# Patient Record
Sex: Female | Born: 1960
Health system: Southern US, Community
[De-identification: ages and names within clinical notes are randomized; demographics above are authoritative.]

## PROBLEM LIST (undated history)

## (undated) DIAGNOSIS — N393 Stress incontinence (female) (male): Secondary | ICD-10-CM

## (undated) DIAGNOSIS — M869 Osteomyelitis, unspecified: Secondary | ICD-10-CM

## (undated) DIAGNOSIS — F209 Schizophrenia, unspecified: Secondary | ICD-10-CM

## (undated) DIAGNOSIS — K255 Chronic or unspecified gastric ulcer with perforation: Secondary | ICD-10-CM

## (undated) DIAGNOSIS — I10 Essential (primary) hypertension: Secondary | ICD-10-CM

## (undated) DIAGNOSIS — Z89511 Acquired absence of right leg below knee: Secondary | ICD-10-CM

## (undated) DIAGNOSIS — M009 Pyogenic arthritis, unspecified: Secondary | ICD-10-CM

## (undated) HISTORY — DX: Stress incontinence (female) (male): N39.3

## (undated) HISTORY — DX: Pyogenic arthritis, unspecified: M00.9

## (undated) HISTORY — DX: Osteomyelitis, unspecified: M86.9

## (undated) HISTORY — PX: TUBAL LIGATION: SHX77

---

## 1997-06-24 ENCOUNTER — Emergency Department (HOSPITAL_COMMUNITY): Admission: EM | Admit: 1997-06-24 | Discharge: 1997-06-24 | Payer: Self-pay | Admitting: Emergency Medicine

## 1998-12-23 ENCOUNTER — Emergency Department (HOSPITAL_COMMUNITY): Admission: EM | Admit: 1998-12-23 | Discharge: 1998-12-23 | Payer: Self-pay | Admitting: Emergency Medicine

## 1999-10-02 ENCOUNTER — Emergency Department (HOSPITAL_COMMUNITY): Admission: EM | Admit: 1999-10-02 | Discharge: 1999-10-02 | Payer: Self-pay | Admitting: Emergency Medicine

## 2000-02-18 ENCOUNTER — Emergency Department (HOSPITAL_COMMUNITY): Admission: EM | Admit: 2000-02-18 | Discharge: 2000-02-18 | Payer: Self-pay | Admitting: Emergency Medicine

## 2000-04-19 ENCOUNTER — Emergency Department (HOSPITAL_COMMUNITY): Admission: EM | Admit: 2000-04-19 | Discharge: 2000-04-19 | Payer: Self-pay | Admitting: Emergency Medicine

## 2000-06-23 ENCOUNTER — Emergency Department (HOSPITAL_COMMUNITY): Admission: EM | Admit: 2000-06-23 | Discharge: 2000-06-23 | Payer: Self-pay | Admitting: Emergency Medicine

## 2000-09-11 ENCOUNTER — Emergency Department (HOSPITAL_COMMUNITY): Admission: EM | Admit: 2000-09-11 | Discharge: 2000-09-11 | Payer: Self-pay | Admitting: Emergency Medicine

## 2002-11-11 ENCOUNTER — Emergency Department (HOSPITAL_COMMUNITY): Admission: EM | Admit: 2002-11-11 | Discharge: 2002-11-11 | Payer: Self-pay | Admitting: Emergency Medicine

## 2002-11-12 ENCOUNTER — Other Ambulatory Visit: Admission: RE | Admit: 2002-11-12 | Discharge: 2002-11-12 | Payer: Self-pay | Admitting: Nephrology

## 2003-01-26 ENCOUNTER — Emergency Department (HOSPITAL_COMMUNITY): Admission: AD | Admit: 2003-01-26 | Discharge: 2003-01-26 | Payer: Self-pay | Admitting: Family Medicine

## 2003-02-02 ENCOUNTER — Emergency Department (HOSPITAL_COMMUNITY): Admission: AD | Admit: 2003-02-02 | Discharge: 2003-02-02 | Payer: Self-pay | Admitting: Family Medicine

## 2003-05-12 ENCOUNTER — Emergency Department (HOSPITAL_COMMUNITY): Admission: EM | Admit: 2003-05-12 | Discharge: 2003-05-12 | Payer: Self-pay | Admitting: Family Medicine

## 2003-08-24 ENCOUNTER — Emergency Department (HOSPITAL_COMMUNITY): Admission: EM | Admit: 2003-08-24 | Discharge: 2003-08-24 | Payer: Self-pay | Admitting: Emergency Medicine

## 2003-09-01 ENCOUNTER — Emergency Department (HOSPITAL_COMMUNITY): Admission: EM | Admit: 2003-09-01 | Discharge: 2003-09-01 | Payer: Self-pay | Admitting: *Deleted

## 2004-11-21 ENCOUNTER — Emergency Department (HOSPITAL_COMMUNITY): Admission: EM | Admit: 2004-11-21 | Discharge: 2004-11-21 | Payer: Self-pay | Admitting: Emergency Medicine

## 2004-12-01 ENCOUNTER — Emergency Department (HOSPITAL_COMMUNITY): Admission: EM | Admit: 2004-12-01 | Discharge: 2004-12-01 | Payer: Self-pay | Admitting: Emergency Medicine

## 2005-04-18 ENCOUNTER — Emergency Department (HOSPITAL_COMMUNITY): Admission: EM | Admit: 2005-04-18 | Discharge: 2005-04-18 | Payer: Self-pay | Admitting: Emergency Medicine

## 2005-09-05 ENCOUNTER — Emergency Department (HOSPITAL_COMMUNITY): Admission: EM | Admit: 2005-09-05 | Discharge: 2005-09-05 | Payer: Self-pay | Admitting: Emergency Medicine

## 2005-09-29 ENCOUNTER — Emergency Department (HOSPITAL_COMMUNITY): Admission: EM | Admit: 2005-09-29 | Discharge: 2005-09-29 | Payer: Self-pay | Admitting: Emergency Medicine

## 2005-10-03 ENCOUNTER — Ambulatory Visit: Payer: Self-pay | Admitting: *Deleted

## 2005-10-03 ENCOUNTER — Ambulatory Visit: Payer: Self-pay | Admitting: Family Medicine

## 2005-11-13 ENCOUNTER — Emergency Department (HOSPITAL_COMMUNITY): Admission: EM | Admit: 2005-11-13 | Discharge: 2005-11-13 | Payer: Self-pay | Admitting: Emergency Medicine

## 2006-12-28 ENCOUNTER — Emergency Department (HOSPITAL_COMMUNITY): Admission: EM | Admit: 2006-12-28 | Discharge: 2006-12-28 | Payer: Self-pay | Admitting: *Deleted

## 2007-05-11 ENCOUNTER — Emergency Department (HOSPITAL_COMMUNITY): Admission: EM | Admit: 2007-05-11 | Discharge: 2007-05-12 | Payer: Self-pay | Admitting: Emergency Medicine

## 2007-06-11 ENCOUNTER — Ambulatory Visit: Payer: Self-pay | Admitting: Family Medicine

## 2007-08-02 ENCOUNTER — Ambulatory Visit: Payer: Self-pay | Admitting: Family Medicine

## 2007-08-09 ENCOUNTER — Ambulatory Visit: Payer: Self-pay | Admitting: Family Medicine

## 2008-08-08 ENCOUNTER — Emergency Department (HOSPITAL_COMMUNITY): Admission: EM | Admit: 2008-08-08 | Discharge: 2008-08-08 | Payer: Self-pay | Admitting: Emergency Medicine

## 2008-08-28 ENCOUNTER — Emergency Department (HOSPITAL_COMMUNITY): Admission: EM | Admit: 2008-08-28 | Discharge: 2008-08-28 | Payer: Self-pay | Admitting: Family Medicine

## 2008-09-03 ENCOUNTER — Emergency Department (HOSPITAL_COMMUNITY): Admission: EM | Admit: 2008-09-03 | Discharge: 2008-09-03 | Payer: Self-pay | Admitting: Emergency Medicine

## 2008-09-09 ENCOUNTER — Observation Stay (HOSPITAL_COMMUNITY): Admission: EM | Admit: 2008-09-09 | Discharge: 2008-09-09 | Payer: Self-pay | Admitting: Emergency Medicine

## 2008-10-26 ENCOUNTER — Emergency Department (HOSPITAL_COMMUNITY): Admission: EM | Admit: 2008-10-26 | Discharge: 2008-10-26 | Payer: Self-pay | Admitting: Emergency Medicine

## 2009-01-29 ENCOUNTER — Ambulatory Visit: Payer: Self-pay | Admitting: Family Medicine

## 2009-01-29 LAB — CONVERTED CEMR LAB
ALT: 27 units/L (ref 0–35)
AST: 20 units/L (ref 0–37)
Albumin: 4.2 g/dL (ref 3.5–5.2)
Alkaline Phosphatase: 49 units/L (ref 39–117)
CO2: 23 meq/L (ref 19–32)
Calcium: 9.5 mg/dL (ref 8.4–10.5)
Chloride: 103 meq/L (ref 96–112)
Total Bilirubin: 0.4 mg/dL (ref 0.3–1.2)
Total Protein: 7.5 g/dL (ref 6.0–8.3)

## 2009-02-12 ENCOUNTER — Ambulatory Visit: Payer: Self-pay | Admitting: Family Medicine

## 2009-03-02 ENCOUNTER — Ambulatory Visit: Payer: Self-pay | Admitting: Family Medicine

## 2009-05-05 ENCOUNTER — Encounter (INDEPENDENT_AMBULATORY_CARE_PROVIDER_SITE_OTHER): Payer: Self-pay | Admitting: Family Medicine

## 2009-05-05 ENCOUNTER — Ambulatory Visit: Payer: Self-pay | Admitting: Internal Medicine

## 2009-05-05 LAB — CONVERTED CEMR LAB
Basophils Absolute: 0 10*3/uL (ref 0.0–0.1)
Basophils Relative: 0 % (ref 0–1)
Cholesterol: 236 mg/dL — ABNORMAL HIGH (ref 0–200)
Eosinophils Absolute: 0.1 10*3/uL (ref 0.0–0.7)
Eosinophils Relative: 2 % (ref 0–5)
HCT: 35.3 % — ABNORMAL LOW (ref 36.0–46.0)
HDL: 68 mg/dL (ref >39)
Hemoglobin: 11 g/dL — ABNORMAL LOW (ref 12.0–15.0)
LDL Cholesterol: 155 mg/dL — ABNORMAL HIGH (ref 0–99)
MCHC: 31.2 g/dL (ref 30.0–36.0)
MCV: 93.6 fL (ref 78.0–100.0)
Monocytes Absolute: 0.8 10*3/uL (ref 0.1–1.0)
Monocytes Relative: 12 % (ref 3–12)
RBC: 3.77 M/uL — ABNORMAL LOW (ref 3.87–5.11)
RDW: 14.5 % (ref 11.5–15.5)
Total CHOL/HDL Ratio: 3.5
VLDL: 13 mg/dL (ref 0–40)

## 2009-05-21 ENCOUNTER — Ambulatory Visit: Payer: Self-pay | Admitting: Family Medicine

## 2009-07-21 ENCOUNTER — Ambulatory Visit: Payer: Self-pay | Admitting: Family Medicine

## 2010-01-04 ENCOUNTER — Emergency Department (HOSPITAL_COMMUNITY): Admission: EM | Admit: 2010-01-04 | Discharge: 2010-01-04 | Payer: Self-pay | Admitting: Family Medicine

## 2010-04-03 ENCOUNTER — Encounter: Payer: Self-pay | Admitting: Family Medicine

## 2010-04-29 ENCOUNTER — Emergency Department (HOSPITAL_COMMUNITY): Payer: Self-pay

## 2010-04-29 ENCOUNTER — Emergency Department (HOSPITAL_COMMUNITY)
Admission: EM | Admit: 2010-04-29 | Discharge: 2010-04-29 | Disposition: A | Discharge status: Home / Self Care | Payer: Self-pay | Attending: Emergency Medicine | Admitting: Emergency Medicine

## 2010-04-29 DIAGNOSIS — R209 Unspecified disturbances of skin sensation: Secondary | ICD-10-CM | POA: Insufficient documentation

## 2010-04-29 DIAGNOSIS — J45909 Unspecified asthma, uncomplicated: Secondary | ICD-10-CM | POA: Insufficient documentation

## 2010-04-29 DIAGNOSIS — Z79899 Other long term (current) drug therapy: Secondary | ICD-10-CM | POA: Insufficient documentation

## 2010-04-29 DIAGNOSIS — I10 Essential (primary) hypertension: Secondary | ICD-10-CM | POA: Insufficient documentation

## 2010-04-29 DIAGNOSIS — R29898 Other symptoms and signs involving the musculoskeletal system: Secondary | ICD-10-CM | POA: Insufficient documentation

## 2010-04-29 DIAGNOSIS — R079 Chest pain, unspecified: Secondary | ICD-10-CM | POA: Insufficient documentation

## 2010-04-29 DIAGNOSIS — E119 Type 2 diabetes mellitus without complications: Secondary | ICD-10-CM | POA: Insufficient documentation

## 2010-04-29 DIAGNOSIS — F341 Dysthymic disorder: Secondary | ICD-10-CM | POA: Insufficient documentation

## 2010-04-29 LAB — POCT PREGNANCY, URINE: Preg Test, Ur: NEGATIVE

## 2010-04-29 LAB — CBC
Hemoglobin: 13 g/dL (ref 12.0–15.0)
MCH: 30.7 pg (ref 26.0–34.0)
MCHC: 34.8 g/dL (ref 30.0–36.0)

## 2010-04-29 LAB — URINALYSIS, ROUTINE W REFLEX MICROSCOPIC
Bilirubin Urine: NEGATIVE
Hgb urine dipstick: NEGATIVE
Ketones, ur: NEGATIVE mg/dL
Leukocytes, UA: NEGATIVE
Protein, ur: NEGATIVE mg/dL
Specific Gravity, Urine: 1.027 (ref 1.005–1.030)
Urine Glucose, Fasting: >1000 mg/dL — AB

## 2010-04-29 LAB — BASIC METABOLIC PANEL
CO2: 24 mEq/L (ref 19–32)
Calcium: 9.7 mg/dL (ref 8.4–10.5)
Creatinine, Ser: 0.59 mg/dL (ref 0.4–1.2)
GFR calc Af Amer: >60 mL/min (ref >60)
GFR calc non Af Amer: >60 mL/min (ref >60)
Glucose, Bld: 358 mg/dL — ABNORMAL HIGH (ref 70–99)
Potassium: 4.4 mEq/L (ref 3.5–5.1)

## 2010-04-29 LAB — DIFFERENTIAL
Basophils Absolute: 0 10*3/uL (ref 0.0–0.1)
Basophils Relative: 0 % (ref 0–1)
Eosinophils Relative: 1 % (ref 0–5)
Monocytes Absolute: 0.6 10*3/uL (ref 0.1–1.0)
Monocytes Relative: 9 % (ref 3–12)
Neutro Abs: 3.7 10*3/uL (ref 1.7–7.7)
Neutrophils Relative %: 52 % (ref 43–77)

## 2010-04-29 LAB — POCT I-STAT 3, ART BLOOD GAS (G3+)
Bicarbonate: 24.5 mEq/L — ABNORMAL HIGH (ref 20.0–24.0)
O2 Saturation: 97 %
TCO2: 26 mmol/L (ref 0–100)
pO2, Arterial: 91 mmHg (ref 80.0–100.0)

## 2010-04-29 LAB — POCT CARDIAC MARKERS
CKMB, poc: <1 ng/mL — ABNORMAL LOW (ref 1.0–8.0)
Myoglobin, poc: 50.8 ng/mL (ref 12–200)
Troponin i, poc: <0.05 ng/mL (ref 0.00–0.09)

## 2010-05-16 ENCOUNTER — Encounter (INDEPENDENT_AMBULATORY_CARE_PROVIDER_SITE_OTHER): Payer: Self-pay | Admitting: *Deleted

## 2010-05-24 NOTE — Letter (Signed)
Summary: Generic Letter  [facility]  [address] [place], [place] [address]   Phone: [phone]  Fax: [phone]    05/16/2010  [facility] [place],   [address]  Dear Ms. Dombrosky,  We have been unable to reach you by phone. We have scheduled an office visit for you with Dr. Leward Quan on April 16 at 2:15PM. If this date and time does not work for you please call our office to reschedule. Dr. Leward Quan has called in a prescription for you at [facility] on [address]. for Metformin 1000mg  take one tablet twice a day. Please check your blood sugars 1-2 times per day and bring your log book with you to your appointment. You need to also make sure you are following a diabetic diet. I have enclosed information for you to help you begin to change your diet. If you have in questions please don't hesitate to call our office.         Sincerely,   Bridgett Larsson RN

## 2010-05-28 ENCOUNTER — Emergency Department (HOSPITAL_COMMUNITY)
Admission: EM | Admit: 2010-05-28 | Discharge: 2010-05-28 | Disposition: A | Discharge status: Home / Self Care | Payer: Self-pay | Attending: Emergency Medicine | Admitting: Emergency Medicine

## 2010-05-28 DIAGNOSIS — Z79899 Other long term (current) drug therapy: Secondary | ICD-10-CM | POA: Insufficient documentation

## 2010-05-28 DIAGNOSIS — E119 Type 2 diabetes mellitus without complications: Secondary | ICD-10-CM | POA: Insufficient documentation

## 2010-05-28 DIAGNOSIS — H1045 Other chronic allergic conjunctivitis: Secondary | ICD-10-CM | POA: Insufficient documentation

## 2010-05-28 DIAGNOSIS — F341 Dysthymic disorder: Secondary | ICD-10-CM | POA: Insufficient documentation

## 2010-05-28 DIAGNOSIS — H5789 Other specified disorders of eye and adnexa: Secondary | ICD-10-CM | POA: Insufficient documentation

## 2010-05-28 DIAGNOSIS — J3489 Other specified disorders of nose and nasal sinuses: Secondary | ICD-10-CM | POA: Insufficient documentation

## 2010-05-28 DIAGNOSIS — J45909 Unspecified asthma, uncomplicated: Secondary | ICD-10-CM | POA: Insufficient documentation

## 2010-05-28 DIAGNOSIS — I10 Essential (primary) hypertension: Secondary | ICD-10-CM | POA: Insufficient documentation

## 2010-06-18 LAB — DIFFERENTIAL
Basophils Absolute: 0 10*3/uL (ref 0.0–0.1)
Basophils Relative: 0 % (ref 0–1)
Eosinophils Absolute: 0 10*3/uL (ref 0.0–0.7)
Eosinophils Relative: 1 % (ref 0–5)
Monocytes Relative: 8 % (ref 3–12)
Neutro Abs: 5.2 10*3/uL (ref 1.7–7.7)
Neutrophils Relative %: 61 % (ref 43–77)

## 2010-06-18 LAB — POCT I-STAT, CHEM 8
Creatinine, Ser: 0.7 mg/dL (ref 0.4–1.2)
Glucose, Bld: 574 mg/dL (ref 70–99)
HCT: 41 % (ref 36.0–46.0)
Hemoglobin: 13.9 g/dL (ref 12.0–15.0)
Potassium: 4 mEq/L (ref 3.5–5.1)
Sodium: 132 mEq/L — ABNORMAL LOW (ref 135–145)
TCO2: 18 mmol/L (ref 0–100)

## 2010-06-18 LAB — BASIC METABOLIC PANEL
BUN: 6 mg/dL (ref 6–23)
CO2: 22 mEq/L (ref 19–32)
GFR calc non Af Amer: >60 mL/min (ref >60)
Glucose, Bld: 276 mg/dL — ABNORMAL HIGH (ref 70–99)
Potassium: 3.2 mEq/L — ABNORMAL LOW (ref 3.5–5.1)
Sodium: 133 mEq/L — ABNORMAL LOW (ref 135–145)

## 2010-06-18 LAB — CBC
HCT: 37.5 % (ref 36.0–46.0)
MCHC: 34.3 g/dL (ref 30.0–36.0)
MCV: 90.7 fL (ref 78.0–100.0)
Platelets: 155 10*3/uL (ref 150–400)
RBC: 4.14 MIL/uL (ref 3.87–5.11)

## 2010-06-18 LAB — GLUCOSE, CAPILLARY: Glucose-Capillary: 552 mg/dL (ref 70–99)

## 2010-06-20 LAB — GLUCOSE, CAPILLARY
Glucose-Capillary: 332 mg/dL — ABNORMAL HIGH (ref 70–99)
Glucose-Capillary: 381 mg/dL — ABNORMAL HIGH (ref 70–99)
Glucose-Capillary: 389 mg/dL — ABNORMAL HIGH (ref 70–99)
Glucose-Capillary: 394 mg/dL — ABNORMAL HIGH (ref 70–99)
Glucose-Capillary: 475 mg/dL — ABNORMAL HIGH (ref 70–99)
Glucose-Capillary: 478 mg/dL — ABNORMAL HIGH (ref 70–99)

## 2010-06-20 LAB — URINALYSIS, ROUTINE W REFLEX MICROSCOPIC
Bilirubin Urine: NEGATIVE
Bilirubin Urine: NEGATIVE
Glucose, UA: >1000 mg/dL — AB
Glucose, UA: >1000 mg/dL — AB
Hgb urine dipstick: NEGATIVE
Ketones, ur: 15 mg/dL — AB
Ketones, ur: 40 mg/dL — AB
Leukocytes, UA: NEGATIVE
Nitrite: NEGATIVE
Protein, ur: NEGATIVE mg/dL
Specific Gravity, Urine: 1.043 — ABNORMAL HIGH (ref 1.005–1.030)
pH: 5.5 (ref 5.0–8.0)
pH: 5.5 (ref 5.0–8.0)

## 2010-06-20 LAB — POCT I-STAT 3, VENOUS BLOOD GAS (G3P V)
Acid-base deficit: 1 mmol/L (ref 0.0–2.0)
Bicarbonate: 22.8 mEq/L (ref 20.0–24.0)
TCO2: 24 mmol/L (ref 0–100)

## 2010-06-20 LAB — POCT I-STAT, CHEM 8
BUN: 13 mg/dL (ref 6–23)
Calcium, Ion: 1.19 mmol/L (ref 1.12–1.32)
Chloride: 103 mEq/L (ref 96–112)
HCT: 46 % (ref 36.0–46.0)
Hemoglobin: 15.6 g/dL — ABNORMAL HIGH (ref 12.0–15.0)
Potassium: 4.1 mEq/L (ref 3.5–5.1)
Sodium: 134 mEq/L — ABNORMAL LOW (ref 135–145)
TCO2: 23 mmol/L (ref 0–100)

## 2010-06-20 LAB — WET PREP, GENITAL: Clue Cells Wet Prep HPF POC: NONE SEEN

## 2010-06-20 LAB — POCT URINALYSIS DIP (DEVICE)
Bilirubin Urine: NEGATIVE
Glucose, UA: >=1000 mg/dL — AB
Hgb urine dipstick: NEGATIVE
Ketones, ur: 40 mg/dL — AB
Nitrite: NEGATIVE
Protein, ur: 30 mg/dL — AB
Specific Gravity, Urine: 1.01 (ref 1.005–1.030)
pH: 5.5 (ref 5.0–8.0)

## 2010-06-20 LAB — BASIC METABOLIC PANEL
BUN: 7 mg/dL (ref 6–23)
CO2: 21 mEq/L (ref 19–32)
Calcium: 8.5 mg/dL (ref 8.4–10.5)
Chloride: 100 mEq/L (ref 96–112)
Chloride: 103 mEq/L (ref 96–112)
Creatinine, Ser: 0.75 mg/dL (ref 0.4–1.2)
GFR calc Af Amer: >60 mL/min (ref >60)
GFR calc Af Amer: >60 mL/min (ref >60)
Glucose, Bld: 495 mg/dL — ABNORMAL HIGH (ref 70–99)
Potassium: 3.9 mEq/L (ref 3.5–5.1)
Potassium: 4.6 mEq/L (ref 3.5–5.1)
Sodium: 132 mEq/L — ABNORMAL LOW (ref 135–145)

## 2010-06-20 LAB — URINE CULTURE: Colony Count: >=100000

## 2010-06-20 LAB — POCT PREGNANCY, URINE
Preg Test, Ur: NEGATIVE
Preg Test, Ur: NEGATIVE
Preg Test, Ur: NEGATIVE

## 2010-06-20 LAB — URINE MICROSCOPIC-ADD ON

## 2010-06-20 LAB — POTASSIUM: Potassium: 4.1 mEq/L (ref 3.5–5.1)

## 2010-10-04 ENCOUNTER — Observation Stay (HOSPITAL_COMMUNITY)
Admission: EM | Admit: 2010-10-04 | Discharge: 2010-10-05 | Disposition: A | Discharge status: Home Health | Payer: Self-pay | Attending: Internal Medicine | Admitting: Internal Medicine

## 2010-10-04 ENCOUNTER — Emergency Department (HOSPITAL_COMMUNITY): Payer: Self-pay

## 2010-10-04 DIAGNOSIS — E1165 Type 2 diabetes mellitus with hyperglycemia: Secondary | ICD-10-CM | POA: Insufficient documentation

## 2010-10-04 DIAGNOSIS — F411 Generalized anxiety disorder: Secondary | ICD-10-CM | POA: Insufficient documentation

## 2010-10-04 DIAGNOSIS — IMO0002 Reserved for concepts with insufficient information to code with codable children: Secondary | ICD-10-CM | POA: Insufficient documentation

## 2010-10-04 DIAGNOSIS — I10 Essential (primary) hypertension: Secondary | ICD-10-CM | POA: Insufficient documentation

## 2010-10-04 DIAGNOSIS — F3289 Other specified depressive episodes: Secondary | ICD-10-CM | POA: Insufficient documentation

## 2010-10-04 DIAGNOSIS — R079 Chest pain, unspecified: Principal | ICD-10-CM | POA: Insufficient documentation

## 2010-10-04 DIAGNOSIS — J45909 Unspecified asthma, uncomplicated: Secondary | ICD-10-CM | POA: Insufficient documentation

## 2010-10-04 DIAGNOSIS — F329 Major depressive disorder, single episode, unspecified: Secondary | ICD-10-CM | POA: Insufficient documentation

## 2010-10-04 LAB — DIFFERENTIAL
Basophils Relative: 0 % (ref 0–1)
Eosinophils Absolute: 0.1 10*3/uL (ref 0.0–0.7)
Lymphs Abs: 2.4 10*3/uL (ref 0.7–4.0)
Monocytes Absolute: 0.8 10*3/uL (ref 0.1–1.0)
Monocytes Relative: 12 % (ref 3–12)
Neutro Abs: 3.6 10*3/uL (ref 1.7–7.7)
Neutrophils Relative %: 52 % (ref 43–77)

## 2010-10-04 LAB — CARDIAC PANEL(CRET KIN+CKTOT+MB+TROPI)
CK, MB: 3.2 ng/mL (ref 0.3–4.0)
Relative Index: 1.7 (ref 0.0–2.5)
Total CK: 183 U/L — ABNORMAL HIGH (ref 7–177)

## 2010-10-04 LAB — BASIC METABOLIC PANEL
BUN: 14 mg/dL (ref 6–23)
CO2: 20 mEq/L (ref 19–32)
Calcium: 8.7 mg/dL (ref 8.4–10.5)
Chloride: 109 mEq/L (ref 96–112)
Creatinine, Ser: 0.68 mg/dL (ref 0.50–1.10)
Glucose, Bld: 160 mg/dL — ABNORMAL HIGH (ref 70–99)

## 2010-10-04 LAB — GLUCOSE, CAPILLARY
Glucose-Capillary: 256 mg/dL — ABNORMAL HIGH (ref 70–99)
Glucose-Capillary: 145 mg/dL — ABNORMAL HIGH (ref 70–99)
Glucose-Capillary: 117 mg/dL — ABNORMAL HIGH (ref 70–99)

## 2010-10-04 LAB — TSH: TSH: 1.597 u[IU]/mL (ref 0.350–4.500)

## 2010-10-04 LAB — CBC
Hemoglobin: 10.9 g/dL — ABNORMAL LOW (ref 12.0–15.0)
MCH: 30.4 pg (ref 26.0–34.0)
MCV: 88.9 fL (ref 78.0–100.0)
Platelets: 177 10*3/uL (ref 150–400)
RBC: 3.59 MIL/uL — ABNORMAL LOW (ref 3.87–5.11)
WBC: 6.9 10*3/uL (ref 4.0–10.5)

## 2010-10-04 LAB — LIPID PANEL
LDL Cholesterol: 106 mg/dL — ABNORMAL HIGH (ref 0–99)
VLDL: 8 mg/dL (ref 0–40)

## 2010-10-04 LAB — HEMOGLOBIN A1C
Hgb A1c MFr Bld: 8 % — ABNORMAL HIGH (ref <5.7)
Mean Plasma Glucose: 183 mg/dL — ABNORMAL HIGH (ref <117)

## 2010-10-05 LAB — BASIC METABOLIC PANEL
CO2: 25 mEq/L (ref 19–32)
Calcium: 8.9 mg/dL (ref 8.4–10.5)
GFR calc Af Amer: >60 mL/min (ref >60)
Glucose, Bld: 125 mg/dL — ABNORMAL HIGH (ref 70–99)
Potassium: 4.4 mEq/L (ref 3.5–5.1)
Sodium: 135 mEq/L (ref 135–145)

## 2010-10-05 LAB — GLUCOSE, CAPILLARY
Glucose-Capillary: 136 mg/dL — ABNORMAL HIGH (ref 70–99)
Glucose-Capillary: 142 mg/dL — ABNORMAL HIGH (ref 70–99)
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
## 2010-12-21 LAB — DIFFERENTIAL
Basophils Absolute: 0
Basophils Relative: 0
Eosinophils Absolute: 0
Eosinophils Relative: 0
Lymphocytes Relative: 33
Lymphs Abs: 2.5
Monocytes Absolute: 1 — ABNORMAL HIGH
Monocytes Relative: 13 — ABNORMAL HIGH
Neutro Abs: 3.9
Neutrophils Relative %: 53

## 2010-12-21 LAB — URINALYSIS, ROUTINE W REFLEX MICROSCOPIC
Bilirubin Urine: NEGATIVE
Nitrite: NEGATIVE
Protein, ur: 100 — AB
Specific Gravity, Urine: 1.04 — ABNORMAL HIGH
Urobilinogen, UA: 0.2

## 2010-12-21 LAB — COMPREHENSIVE METABOLIC PANEL
ALT: 60 — ABNORMAL HIGH
AST: 41 — ABNORMAL HIGH
Albumin: 3.7
Alkaline Phosphatase: 71
BUN: 9
CO2: 28
Calcium: 9.7
Chloride: 103
Creatinine, Ser: 0.69
GFR calc Af Amer: >60
GFR calc non Af Amer: >60
Glucose, Bld: 392 — ABNORMAL HIGH
Potassium: 4.2
Sodium: 137
Total Bilirubin: 0.8
Total Protein: 7.8

## 2010-12-21 LAB — CBC
HCT: 37.4
Hemoglobin: 12.8
MCHC: 34.1
MCV: 88.9
Platelets: 217
RBC: 4.2
RDW: 13.3
WBC: 7.4

## 2010-12-21 LAB — URINE MICROSCOPIC-ADD ON

## 2010-12-21 LAB — POCT PREGNANCY, URINE: Operator id: 28833

## 2011-07-24 ENCOUNTER — Encounter (HOSPITAL_COMMUNITY): Payer: Self-pay | Admitting: Emergency Medicine

## 2011-07-24 ENCOUNTER — Emergency Department (HOSPITAL_COMMUNITY)
Admission: EM | Admit: 2011-07-24 | Discharge: 2011-07-24 | Disposition: A | Discharge status: Home / Self Care | Payer: Self-pay | Attending: Emergency Medicine | Admitting: Emergency Medicine

## 2011-07-24 DIAGNOSIS — E1159 Type 2 diabetes mellitus with other circulatory complications: Secondary | ICD-10-CM

## 2011-07-24 DIAGNOSIS — Z9119 Patient's noncompliance with other medical treatment and regimen: Secondary | ICD-10-CM | POA: Insufficient documentation

## 2011-07-24 DIAGNOSIS — Z91199 Patient's noncompliance with other medical treatment and regimen due to unspecified reason: Secondary | ICD-10-CM | POA: Insufficient documentation

## 2011-07-24 DIAGNOSIS — E1169 Type 2 diabetes mellitus with other specified complication: Secondary | ICD-10-CM | POA: Insufficient documentation

## 2011-07-24 DIAGNOSIS — I10 Essential (primary) hypertension: Secondary | ICD-10-CM | POA: Insufficient documentation

## 2011-07-24 HISTORY — DX: Essential (primary) hypertension: I10

## 2011-07-24 LAB — DIFFERENTIAL
Basophils Absolute: 0 10*3/uL (ref 0.0–0.1)
Basophils Relative: 0 % (ref 0–1)
Eosinophils Absolute: 0.1 10*3/uL (ref 0.0–0.7)
Eosinophils Relative: 1 % (ref 0–5)
Lymphocytes Relative: 38 % (ref 12–46)
Lymphs Abs: 2.8 10*3/uL (ref 0.7–4.0)
Monocytes Absolute: 0.8 10*3/uL (ref 0.1–1.0)
Monocytes Relative: 11 % (ref 3–12)
Neutro Abs: 3.8 10*3/uL (ref 1.7–7.7)
Neutrophils Relative %: 51 % (ref 43–77)

## 2011-07-24 LAB — GLUCOSE, CAPILLARY: Glucose-Capillary: 239 mg/dL — ABNORMAL HIGH (ref 70–99)

## 2011-07-24 LAB — CBC
HCT: 34 % — ABNORMAL LOW (ref 36.0–46.0)
Hemoglobin: 11.6 g/dL — ABNORMAL LOW (ref 12.0–15.0)
MCH: 30.8 pg (ref 26.0–34.0)
MCHC: 34.1 g/dL (ref 30.0–36.0)
MCV: 90.2 fL (ref 78.0–100.0)
Platelets: 155 10*3/uL (ref 150–400)
RBC: 3.77 MIL/uL — ABNORMAL LOW (ref 3.87–5.11)
RDW: 12.7 % (ref 11.5–15.5)
WBC: 7.4 10*3/uL (ref 4.0–10.5)

## 2011-07-24 LAB — URINALYSIS, ROUTINE W REFLEX MICROSCOPIC
Bilirubin Urine: NEGATIVE
Nitrite: NEGATIVE
Specific Gravity, Urine: 1.036 — ABNORMAL HIGH (ref 1.005–1.030)
Urobilinogen, UA: 1 mg/dL (ref 0.0–1.0)

## 2011-07-24 LAB — BASIC METABOLIC PANEL
BUN: 7 mg/dL (ref 6–23)
CO2: 27 mEq/L (ref 19–32)
Calcium: 9.3 mg/dL (ref 8.4–10.5)
Chloride: 102 mEq/L (ref 96–112)
Creatinine, Ser: 0.53 mg/dL (ref 0.50–1.10)
GFR calc Af Amer: >90 mL/min (ref >90)
GFR calc non Af Amer: >90 mL/min (ref >90)
Glucose, Bld: 250 mg/dL — ABNORMAL HIGH (ref 70–99)
Potassium: 3.4 mEq/L — ABNORMAL LOW (ref 3.5–5.1)
Sodium: 137 mEq/L (ref 135–145)

## 2011-07-24 LAB — URINE MICROSCOPIC-ADD ON

## 2011-07-24 MED ORDER — METFORMIN HCL 500 MG PO TABS: 500.0000 mg | ORAL_TABLET | Freq: Two times a day (BID) | ORAL | Status: DC

## 2011-07-24 MED ORDER — LISINOPRIL 20 MG PO TABS: 10.0000 mg | ORAL_TABLET | Freq: Every day | ORAL | Status: DC

## 2011-07-24 NOTE — ED Notes (Signed)
The pt is here to get a rx for bp meds she was seen at a health fair  Earlier today and she was told her blood sugar and bp was high.  She has been out of bp meds for one year.  She has no money.  She has lost her job and cannot find one she lost her apartment etc.  At present bp good

## 2011-07-24 NOTE — ED Provider Notes (Signed)
History     CSN: [id number]  Arrival date & time 07/24/11  [id number]   First MD Initiated Contact with Patient 07/24/11 2151      Chief Complaint  Patient presents with  . Hypertension    (Consider location/radiation/quality/duration/timing/severity/associated sxs/prior treatment) Patient is a 51 y.o. female presenting with hypertension. The history is provided by the patient. No language interpreter was used.  Hypertension This is a chronic problem. The current episode started more than 1 year ago. The problem occurs daily. The problem has been unchanged. Pertinent negatives include no chest pain, fever, headaches, nausea, numbness, vomiting or weakness. The symptoms are aggravated by nothing. She has tried nothing for the symptoms.   Patient here complaining of hypertension and hyperglycemia. States that she went to a health fair and was found to have both. States she used to be on medicine for both but don't stop taking in over a year ago. Records show that she was on lisinopril and metformin. Past Medical History  Diagnosis Date  . Hypertension   . Diabetes mellitus     Past Surgical History  Procedure Date  . Tubal ligation     History reviewed. No pertinent family history.  History  Substance Use Topics  . Smoking status: Never Smoker   . Smokeless tobacco: Not on file  . Alcohol Use: No    OB History    Grav Para Term Preterm Abortions TAB SAB Ect Mult Living                  Review of Systems  Constitutional: Negative.  Negative for fever.  HENT: Negative.   Eyes: Negative.   Respiratory: Negative.   Cardiovascular: Negative.  Negative for chest pain.  Gastrointestinal: Negative.  Negative for nausea and vomiting.  Genitourinary:       Polydipsia and polyuria  Neurological: Negative.  Negative for dizziness, speech difficulty, weakness, numbness and headaches.  Psychiatric/Behavioral: Negative.   All other systems reviewed and are negative.    Allergies   Review of patient's allergies indicates no known allergies.  Home Medications  No current outpatient prescriptions on file.  BP 150/78  Pulse 84  Temp(Src) 99 F (37.2 C) (Oral)  Resp 16  SpO2 100%  Physical Exam  Nursing note and vitals reviewed. Constitutional: She is oriented to person, place, and time. She appears well-developed and well-nourished.  HENT:  Head: Normocephalic and atraumatic.  Eyes: Conjunctivae and EOM are normal. Pupils are equal, round, and reactive to light.  Neck: Normal range of motion. Neck supple.  Cardiovascular: Normal rate.   Pulmonary/Chest: Effort normal.  Abdominal: Soft.  Musculoskeletal: Normal range of motion. She exhibits no edema and no tenderness.  Neurological: She is alert and oriented to person, place, and time. She has normal reflexes. She displays normal reflexes. No cranial nerve deficit. Coordination normal.  Skin: Skin is warm and dry.  Psychiatric: She has a normal mood and affect.    ED Course  Procedures (including critical care time)  Labs Reviewed  GLUCOSE, CAPILLARY - Abnormal; Notable for the following:    Glucose-Capillary 239 (*)    All other components within normal limits  CBC - Abnormal; Notable for the following:    RBC 3.77 (*)    Hemoglobin 11.6 (*)    HCT 34.0 (*)    All other components within normal limits  BASIC METABOLIC PANEL - Abnormal; Notable for the following:    Potassium 3.4 (*)    Glucose, Bld 250 (*)  All other components within normal limits  DIFFERENTIAL  URINALYSIS, ROUTINE W REFLEX MICROSCOPIC   No results found.   No diagnosis found.    MDM  Med refill for lisinopril and metformin.  Out of meds x 1 year.  CBG 239 without ketocidosis symptoms.  No sign of stroke or symptoms.  Labs unremarkable.  Will follow up with pcp asap. Needs to renew orange card.   Labs Reviewed  URINALYSIS, ROUTINE W REFLEX MICROSCOPIC - Abnormal; Notable for the following:    APPearance TURBID (*)     Specific Gravity, Urine 1.036 (*)    Glucose, UA >1000 (*)    Hgb urine dipstick LARGE (*)    Ketones, ur 15 (*)    Protein, ur 30 (*)    Leukocytes, UA SMALL (*)    All other components within normal limits  GLUCOSE, CAPILLARY - Abnormal; Notable for the following:    Glucose-Capillary 239 (*)    All other components within normal limits  CBC - Abnormal; Notable for the following:    RBC 3.77 (*)    Hemoglobin 11.6 (*)    HCT 34.0 (*)    All other components within normal limits  BASIC METABOLIC PANEL - Abnormal; Notable for the following:    Potassium 3.4 (*)    Glucose, Bld 250 (*)    All other components within normal limits  URINE MICROSCOPIC-ADD ON - Abnormal; Notable for the following:    Squamous Epithelial / LPF MANY (*)    Bacteria, UA MANY (*)    All other components within normal limits  DIFFERENTIAL  LAB REPORT - SCANNED          Julieta Bellini, NP 07/25/11 1511

## 2011-07-24 NOTE — ED Notes (Signed)
Pt states she is unable to give urine sample at this time. 

## 2011-07-24 NOTE — ED Notes (Signed)
reports went to health fair and was told that BP was high and glucose was high; reports recent stress; reports polydipsia, and polyuria; reports has not had medicine in a year or more;

## 2011-07-24 NOTE — ED Notes (Signed)
Pt waiting for lab results

## 2011-07-24 NOTE — Discharge Instructions (Signed)
Ms Qui your blood pressure today was 146/76. Your blood sugar today was 230. I will restart her metformin and her lisinopril which you're on a year ago. Get a PCP from the list below and get your orange card updated or comments this week. Return for symptoms such as severe headache weakness on one side or blurred vision.  RESOURCE GUIDE  Dental Problems  Patients with Medicaid: [place] Phone:  [phone]                                                  Phone:  [phone]  If unable to pay or uninsured, contact:  [facility] or [facility] to become qualified for the adult dental clinic.  Chronic Pain Problems Contact [facility]  [phone] Patients need to be referred by their primary care doctor.  Insufficient Money for Medicine Contact [employer]:  call "211" or [facility] [place] [phone].  No Primary Care Doctor Call [facility]  [phone] Other agencies that provide inexpensive medical care    [place]  [phone]    [facility]  [place]  [phone]    [facility]  [phone]    [facility]  [place]  [phone] [place]   [phone] (emergency services [phone])  [facility]  [phone] [facility] [phone] [facility] [place] [phone] [place] [phone] Residential & Outpatient Substance Abuse Program  [phone]  Abuse/Neglect [place] [phone] [place] [phone] (After Hours)  Emergency [place] [phone]  [place] [facility] [place] [phone] [place] [phone]  MRSA Hotline #:   706-646-4343    [facility] [address]      [place] Phone:  [phone]  Phone:  [phone]                 Phone:  [place] Phone:  [place] [phone] (After Hours)  Blood Sugar Monitoring, Adult GLUCOSE METERS FOR SELF-MONITORING OF BLOOD GLUCOSE  It is important to be able to correctly measure your blood sugar (glucose). You can use a blood glucose monitor (a small battery-operated device) to check your glucose level at any time. This allows you and your caregiver to monitor your diabetes and to determine how well your treatment plan is working. The process of monitoring your blood glucose with a glucose meter is called self-monitoring of blood glucose (SMBG). When people with diabetes control their blood sugar, they have better health. To test for glucose with a typical glucose meter, place the disposable strip in the meter. Then place a small sample of blood on the "test strip." The test strip is coated with chemicals that combine with glucose in blood. The meter measures how much glucose is present. The meter displays the glucose level as a number. Several new models can record and store a number of test results. Some models can connect to personal computers to store test results or print them out.  Newer meters are often easier to use than older models. Some meters allow you to get blood from places other than your fingertip. Some new models have automatic timing, error  codes, signals, or barcode readers to help with proper adjustment (calibration). Some meters have a large display screen or spoken instructions for people with visual impairments.  INSTRUCTIONS FOR USING GLUCOSE METERS  Wash your hands with soap and warm water, or clean the area with alcohol. Dry your hands completely.   Prick the side of your fingertip with a lancet (a sharp-pointed tool used by hand).   Hold the hand down and gently milk the finger until a small drop of blood appears. Catch the blood with the test strip.   Follow the instructions for inserting the test strip and using the SMBG meter. Most meters require the meter to be turned on and the test strip to be inserted before applying the blood sample.   Record the test result.   Read the instructions carefully for both the meter and the test strips that go with it. Meter instructions are found in the user manual. Keep this manual to help you solve any problems that may arise. Many meters use "error codes" when there is a problem with the meter, the test strip, or the blood sample on the strip. You will need the manual to understand these error codes and fix the problem.   New devices are available such as laser lancets and meters that can test blood taken from "alternative sites" of the body, other than fingertips. However, you should use standard fingertip testing if your glucose changes rapidly. Also, use standard testing if:   You have eaten, exercised, or taken insulin in the past 2 hours.   You think your glucose is low.   You tend to not feel symptoms of low blood glucose (hypoglycemia).   You are ill or under stress.   Clean the meter as directed by the manufacturer.   Test the meter for accuracy as directed by the manufacturer.   Take your meter with you to your caregiver's office. This way, you can test your glucose in front of your caregiver to make sure you are using  the meter correctly. Your caregiver can also  take a sample of blood to test using a routine lab method. If values on the glucose meter are close to the lab results, you and your caregiver will see that your meter is working well and you are using good technique. Your caregiver will advise you about what to do if the results do not match.  FREQUENCY OF TESTING  Your caregiver will tell you how often you should check your blood glucose. This will depend on your type of diabetes, your current level of diabetes control, and your types of medicines. The following are general guidelines, but your care plan may be different. Record all your readings and the time of day you took them for review with your caregiver.   Diabetes type 1.   When you are using insulin with good diabetic control (either multiple daily injections or via a pump), you should check your glucose 4 times a day.   If your diabetes is not well controlled, you may need to monitor more frequently, including before meals and 2 hours after meals, at bedtime, and occasionally between 2 a.m. and 3 a.m.   You should always check your glucose before a dose of insulin or before changing the rate on your insulin pump.   Diabetes type 2.   Guidelines for SMBG in diabetes type 2 are not as well defined.   If you are on insulin, follow the guidelines above.   If you are on medicines, but not insulin, and your glucose is not well controlled, you should test at least twice daily.   If you are not on insulin, and your diabetes is controlled with medicines or diet alone, you should test at least once daily, usually before breakfast.   A weekly profile will help your caregiver advise you on your care plan. The week before your visit, check your glucose before a meal and 2 hours after a meal at least daily. You may want to test before and after a different meal each day so you and your caregiver can tell how well controlled your blood sugars are throughout the course of a 24 hour period.    Gestational diabetes (diabetes during pregnancy).   Frequent testing is often necessary. Accurate timing is important.   If you are not on insulin, check your glucose 4 times a day. Check it before breakfast and 1 hour after the start of each meal.   If you are on insulin, check your glucose 6 times a day. Check it before each meal and 1 hour after the first bite of each meal.   General guidelines.   More frequent testing is required at the start of insulin treatment. Your caregiver will instruct you.   Test your glucose any time you suspect you have low blood sugar (hypoglycemia).   You should test more often when you change medicines, when you have unusual stress or illness, or in other unusual circumstances.  OTHER THINGS TO KNOW ABOUT GLUCOSE METERS  Measurement Range. Most glucose meters are able to read glucose levels over a broad range of values from as low as 0 to as high as 600 mg/dL. If you get an extremely high or low reading from your meter, you should first confirm it with another reading. Report very high or very low readings to your caregiver.   Whole Blood Glucose versus Plasma Glucose. Some older home glucose meters measure glucose in your whole blood. In a lab or when using  some newer home glucose meters, the glucose is measured in your plasma (one component of blood). The difference can be important. It is important for you and your caregiver to know whether your meter gives its results as "whole blood equivalent" or "plasma equivalent."   Display of High and Low Glucose Values. Part of learning how to operate a meter is understanding what the meter results mean. Know how high and low glucose concentrations are displayed on your meter.   Factors that Affect Glucose Meter Performance. The accuracy of your test results depends on many factors and varies depending on the brand and type of meter. These factors include:   Low red blood cell count (anemia).   Substances in  your blood (such as uric acid, vitamin C, and others).   Environmental factors (temperature, humidity, altitude).   Name-brand versus generic test strips.   Calibration. Make sure your meter is set up properly. It is a good idea to do a calibration test with a control solution recommended by the manufacturer of your meter whenever you begin using a fresh bottle of test strips. This will help verify the accuracy of your meter.   Improperly stored, expired, or defective test strips. Keep your strips in a dry place with the lid on.   Soiled meter.   Inadequate blood sample.  NEW TECHNOLOGIES FOR GLUCOSE TESTING Alternative site testing Some glucose meters allow testing blood from alternative sites. These include the:  Upper arm.   Forearm.   Base of the thumb.   Thigh.  Sampling blood from alternative sites may be desirable. However, it may have some limitations. Blood in the fingertips show changes in glucose levels more quickly than blood in other parts of the body. This means that alternative site test results may be different from fingertip test results, not because of the meter's ability to test accurately, but because the actual glucose concentration can be different.  Continuous Glucose Monitoring Devices to measure your blood glucose continuously are available, and others are in development. These methods can be more expensive than self-monitoring with a glucose meter. However, it is uncertain how effective and reliable these devices are. Your caregiver will advise you if this approach makes sense for you. IF BLOOD SUGARS ARE CONTROLLED, PEOPLE WITH DIABETES REMAIN HEALTHIER.  SMBG is an important part of the treatment plan of patients with diabetes mellitus. Below are reasons for using SMBG:   It confirms that your glucose is at a specific, healthy level.   It detects hypoglycemia and severe hyperglycemia.   It allows you and your caregiver to make adjustments in response to  changes in lifestyle for individuals requiring medicine.   It determines the need for starting insulin therapy in temporary diabetes that happens during pregnancy (gestational diabetes).  Document Released: 03/02/2003 Document Revised: 02/16/2011 Document Reviewed: 06/23/2010 [facility] Patient Information 2012 [place].Blood Sugar Monitoring, Adult GLUCOSE METERS FOR SELF-MONITORING OF BLOOD GLUCOSE  It is important to be able to correctly measure your blood sugar (glucose). You can use a blood glucose monitor (a small battery-operated device) to check your glucose level at any time. This allows you and your caregiver to monitor your diabetes and to determine how well your treatment plan is working. The process of monitoring your blood glucose with a glucose meter is called self-monitoring of blood glucose (SMBG). When people with diabetes control their blood sugar, they have better health. To test for glucose with a typical glucose meter, place the disposable strip in the  meter. Then place a small sample of blood on the "test strip." The test strip is coated with chemicals that combine with glucose in blood. The meter measures how much glucose is present. The meter displays the glucose level as a number. Several new models can record and store a number of test results. Some models can connect to personal computers to store test results or print them out.  Newer meters are often easier to use than older models. Some meters allow you to get blood from places other than your fingertip. Some new models have automatic timing, error codes, signals, or barcode readers to help with proper adjustment (calibration). Some meters have a large display screen or spoken instructions for people with visual impairments.  INSTRUCTIONS FOR USING GLUCOSE METERS  Wash your hands with soap and warm water, or clean the area with alcohol. Dry your hands completely.   Prick the side of your fingertip with a lancet (a  sharp-pointed tool used by hand).   Hold the hand down and gently milk the finger until a small drop of blood appears. Catch the blood with the test strip.   Follow the instructions for inserting the test strip and using the SMBG meter. Most meters require the meter to be turned on and the test strip to be inserted before applying the blood sample.   Record the test result.   Read the instructions carefully for both the meter and the test strips that go with it. Meter instructions are found in the user manual. Keep this manual to help you solve any problems that may arise. Many meters use "error codes" when there is a problem with the meter, the test strip, or the blood sample on the strip. You will need the manual to understand these error codes and fix the problem.   New devices are available such as laser lancets and meters that can test blood taken from "alternative sites" of the body, other than fingertips. However, you should use standard fingertip testing if your glucose changes rapidly. Also, use standard testing if:   You have eaten, exercised, or taken insulin in the past 2 hours.   You think your glucose is low.   You tend to not feel symptoms of low blood glucose (hypoglycemia).   You are ill or under stress.   Clean the meter as directed by the manufacturer.   Test the meter for accuracy as directed by the manufacturer.   Take your meter with you to your caregiver's office. This way, you can test your glucose in front of your caregiver to make sure you are using the meter correctly. Your caregiver can also take a sample of blood to test using a routine lab method. If values on the glucose meter are close to the lab results, you and your caregiver will see that your meter is working well and you are using good technique. Your caregiver will advise you about what to do if the results do not match.  FREQUENCY OF TESTING  Your caregiver will tell you how often you should check your  blood glucose. This will depend on your type of diabetes, your current level of diabetes control, and your types of medicines. The following are general guidelines, but your care plan may be different. Record all your readings and the time of day you took them for review with your caregiver.   Diabetes type 1.   When you are using insulin with good diabetic control (either multiple daily injections or  via a pump), you should check your glucose 4 times a day.   If your diabetes is not well controlled, you may need to monitor more frequently, including before meals and 2 hours after meals, at bedtime, and occasionally between 2 a.m. and 3 a.m.   You should always check your glucose before a dose of insulin or before changing the rate on your insulin pump.   Diabetes type 2.   Guidelines for SMBG in diabetes type 2 are not as well defined.   If you are on insulin, follow the guidelines above.   If you are on medicines, but not insulin, and your glucose is not well controlled, you should test at least twice daily.   If you are not on insulin, and your diabetes is controlled with medicines or diet alone, you should test at least once daily, usually before breakfast.   A weekly profile will help your caregiver advise you on your care plan. The week before your visit, check your glucose before a meal and 2 hours after a meal at least daily. You may want to test before and after a different meal each day so you and your caregiver can tell how well controlled your blood sugars are throughout the course of a 24 hour period.   Gestational diabetes (diabetes during pregnancy).   Frequent testing is often necessary. Accurate timing is important.   If you are not on insulin, check your glucose 4 times a day. Check it before breakfast and 1 hour after the start of each meal.   If you are on insulin, check your glucose 6 times a day. Check it before each meal and 1 hour after the first bite of each meal.    General guidelines.   More frequent testing is required at the start of insulin treatment. Your caregiver will instruct you.   Test your glucose any time you suspect you have low blood sugar (hypoglycemia).   You should test more often when you change medicines, when you have unusual stress or illness, or in other unusual circumstances.  OTHER THINGS TO KNOW ABOUT GLUCOSE METERS  Measurement Range. Most glucose meters are able to read glucose levels over a broad range of values from as low as 0 to as high as 600 mg/dL. If you get an extremely high or low reading from your meter, you should first confirm it with another reading. Report very high or very low readings to your caregiver.   Whole Blood Glucose versus Plasma Glucose. Some older home glucose meters measure glucose in your whole blood. In a lab or when using some newer home glucose meters, the glucose is measured in your plasma (one component of blood). The difference can be important. It is important for you and your caregiver to know whether your meter gives its results as "whole blood equivalent" or "plasma equivalent."   Display of High and Low Glucose Values. Part of learning how to operate a meter is understanding what the meter results mean. Know how high and low glucose concentrations are displayed on your meter.   Factors that Affect Glucose Meter Performance. The accuracy of your test results depends on many factors and varies depending on the brand and type of meter. These factors include:   Low red blood cell count (anemia).   Substances in your blood (such as uric acid, vitamin C, and others).   Environmental factors (temperature, humidity, altitude).   Name-brand versus generic test strips.   Calibration. Make sure  your meter is set up properly. It is a good idea to do a calibration test with a control solution recommended by the manufacturer of your meter whenever you begin using a fresh bottle of test strips.  This will help verify the accuracy of your meter.   Improperly stored, expired, or defective test strips. Keep your strips in a dry place with the lid on.   Soiled meter.   Inadequate blood sample.  NEW TECHNOLOGIES FOR GLUCOSE TESTING Alternative site testing Some glucose meters allow testing blood from alternative sites. These include the:  Upper arm.   Forearm.   Base of the thumb.   Thigh.  Sampling blood from alternative sites may be desirable. However, it may have some limitations. Blood in the fingertips show changes in glucose levels more quickly than blood in other parts of the body. This means that alternative site test results may be different from fingertip test results, not because of the meter's ability to test accurately, but because the actual glucose concentration can be different.  Continuous Glucose Monitoring Devices to measure your blood glucose continuously are available, and others are in development. These methods can be more expensive than self-monitoring with a glucose meter. However, it is uncertain how effective and reliable these devices are. Your caregiver will advise you if this approach makes sense for you. IF BLOOD SUGARS ARE CONTROLLED, PEOPLE WITH DIABETES REMAIN HEALTHIER.  SMBG is an important part of the treatment plan of patients with diabetes mellitus. Below are reasons for using SMBG:   It confirms that your glucose is at a specific, healthy level.   It detects hypoglycemia and severe hyperglycemia.   It allows you and your caregiver to make adjustments in response to changes in lifestyle for individuals requiring medicine.   It determines the need for starting insulin therapy in temporary diabetes that happens during pregnancy (gestational diabetes).  Document Released: 03/02/2003 Document Revised: 02/16/2011 Document Reviewed: 06/23/2010 [facility] Patient Information 2012 [place].Diabetes and Foot Care Diabetes may cause you to have a  poor blood supply (circulation) to your legs and feet. Because of this, the skin may be thinner, break easier, and heal more slowly. You also may have nerve damage in your legs and feet causing decreased feeling. You may not notice minor injuries to your feet that could lead to serious problems or infections. Taking care of your feet is one of the most important things you can do for yourself.  HOME CARE INSTRUCTIONS  Do not go barefoot. Bare feet are easily injured.   Check your feet daily for blisters, cuts, and redness.   Wash your feet with warm water (not hot) and mild soap. Pat your feet and between your toes until completely dry.   Apply a moisturizing lotion that does not contain alcohol or petroleum jelly to the dry skin on your feet and to dry brittle toenails. Do not put it between your toes.   Trim your toenails straight across. Do not dig under them or around the cuticle.   Do not cut corns or calluses, or try to remove them with medicine.   Wear clean cotton socks or stockings every day. Make sure they are not too tight. Do not wear knee high stockings since they may decrease blood flow to your legs.   Wear leather shoes that fit properly and have enough cushioning. To break in new shoes, wear them just a few hours a day to avoid injuring your feet.   Wear shoes at  all times, even in the house.   Do not cross your legs. This may decrease the blood flow to your feet.   If you find a minor scrape, cut, or break in the skin on your feet, keep it and the skin around it clean and dry. These areas may be cleansed with mild soap and water. Do not use peroxide, alcohol, iodine or Merthiolate.   When you remove an adhesive bandage, be sure not to harm the skin around it.   If you have a wound, look at it several times a day to make sure it is healing.   Do not use heating pads or hot water bottles. Burns can occur. If you have lost feeling in your feet or legs, you may not know it  is happening until it is too late.   Report any cuts, sores or bruises to your caregiver. Do not wait!  SEEK MEDICAL CARE IF:   You have an injury that is not healing or you notice redness, numbness, burning, or tingling.   Your feet always feel cold.   You have pain or cramps in your legs and feet.  SEEK IMMEDIATE MEDICAL CARE IF:   There is increasing redness, swelling, or increasing pain in the wound.   There is a red line that goes up your leg.   Pus is coming from a wound.   You develop an unexplained oral temperature above 102 F (38.9 C), or as your caregiver suggests.   You notice a bad smell coming from an ulcer or wound.  MAKE SURE YOU:   Understand these instructions.   Will watch your condition.   Will get help right away if you are not doing well or get worse.  Document Released: 02/25/2000 Document Revised: 02/16/2011 Document Reviewed: 09/02/2008 [facility] Patient Information 2012 [place].Diabetes and Foot Care Diabetes may cause you to have a poor blood supply (circulation) to your legs and feet. Because of this, the skin may be thinner, break easier, and heal more slowly. You also may have nerve damage in your legs and feet causing decreased feeling. You may not notice minor injuries to your feet that could lead to serious problems or infections. Taking care of your feet is one of the most important things you can do for yourself.  HOME CARE INSTRUCTIONS  Do not go barefoot. Bare feet are easily injured.   Check your feet daily for blisters, cuts, and redness.   Wash your feet with warm water (not hot) and mild soap. Pat your feet and between your toes until completely dry.   Apply a moisturizing lotion that does not contain alcohol or petroleum jelly to the dry skin on your feet and to dry brittle toenails. Do not put it between your toes.   Trim your toenails straight across. Do not dig under them or around the cuticle.   Do not cut corns or  calluses, or try to remove them with medicine.   Wear clean cotton socks or stockings every day. Make sure they are not too tight. Do not wear knee high stockings since they may decrease blood flow to your legs.   Wear leather shoes that fit properly and have enough cushioning. To break in new shoes, wear them just a few hours a day to avoid injuring your feet.   Wear shoes at all times, even in the house.   Do not cross your legs. This may decrease the blood flow to your feet.   If  you find a minor scrape, cut, or break in the skin on your feet, keep it and the skin around it clean and dry. These areas may be cleansed with mild soap and water. Do not use peroxide, alcohol, iodine or Merthiolate.   When you remove an adhesive bandage, be sure not to harm the skin around it.   If you have a wound, look at it several times a day to make sure it is healing.   Do not use heating pads or hot water bottles. Burns can occur. If you have lost feeling in your feet or legs, you may not know it is happening until it is too late.   Report any cuts, sores or bruises to your caregiver. Do not wait!  SEEK MEDICAL CARE IF:   You have an injury that is not healing or you notice redness, numbness, burning, or tingling.   Your feet always feel cold.   You have pain or cramps in your legs and feet.  SEEK IMMEDIATE MEDICAL CARE IF:   There is increasing redness, swelling, or increasing pain in the wound.   There is a red line that goes up your leg.   Pus is coming from a wound.   You develop an unexplained oral temperature above 102 F (38.9 C), or as your caregiver suggests.   You notice a bad smell coming from an ulcer or wound.  MAKE SURE YOU:   Understand these instructions.   Will watch your condition.   Will get help right away if you are not doing well or get worse.  Document Released: 02/25/2000 Document Revised: 02/16/2011 Document Reviewed: 09/02/2008 [facility] Patient Information  2012 [place].Diabetes and Exercise Regular exercise is important and can help:   Control blood glucose (sugar).   Decrease blood pressure.    Control blood lipids (cholesterol, triglycerides).   Improve overall health.  BENEFITS FROM EXERCISE  Improved fitness.   Improved flexibility.   Improved endurance.   Increased bone density.   Weight control.   Increased muscle strength.   Decreased body fat.   Improvement of the body's use of insulin, a hormone.   Increased insulin sensitivity.   Reduction of insulin needs.   Reduced stress and tension.   Helps you feel better.  People with diabetes who add exercise to their lifestyle gain additional benefits, including:  Weight loss.   Reduced appetite.   Improvement of the body's use of blood glucose.   Decreased risk factors for heart disease:   Lowering of cholesterol and triglycerides.   Raising the level of good cholesterol (high-density lipoproteins, HDL).   Lowering blood sugar.   Decreased blood pressure.  TYPE 1 DIABETES AND EXERCISE  Exercise will usually lower your blood glucose.   If blood glucose is greater than 240 mg/dl, check urine ketones. If ketones are present, do not exercise.   Location of the insulin injection sites may need to be adjusted with exercise. Avoid injecting insulin into areas of the body that will be exercised. For example, avoid injecting insulin into:   The arms when playing tennis.   The legs when jogging. For more information, discuss this with your caregiver.   Keep a record of:   Food intake.   Type and amount of exercise.   Expected peak times of insulin action.   Blood glucose levels.  Do this before, during, and after exercise. Review your records with your caregiver. This will help you to develop guidelines for adjusting food intake  and insulin amounts.  TYPE 2 DIABETES AND EXERCISE  Regular physical activity can help control blood glucose.    Exercise is important because it may:   Increase the body's sensitivity to insulin.   Improve blood glucose control.   Exercise reduces the risk of heart disease. It decreases serum cholesterol and triglycerides. It also lowers blood pressure.   Those who take insulin or oral hypoglycemic agents should watch for signs of hypoglycemia. These signs include dizziness, shaking, sweating, chills, and confusion.   Body water is lost during exercise. It must be replaced. This will help to avoid loss of body fluids (dehydration) or heat stroke.  Be sure to talk to your caregiver before starting an exercise program to make sure it is safe for you. Remember, any activity is better than none.  Document Released: 05/20/2003 Document Revised: 02/16/2011 Document Reviewed: 09/03/2008 [facility] Patient Information 2012 [place].Diabetes and Exercise Regular exercise is important and can help:   Control blood glucose (sugar).   Decrease blood pressure.    Control blood lipids (cholesterol, triglycerides).   Improve overall health.  BENEFITS FROM EXERCISE  Improved fitness.   Improved flexibility.   Improved endurance.   Increased bone density.   Weight control.   Increased muscle strength.   Decreased body fat.   Improvement of the body's use of insulin, a hormone.   Increased insulin sensitivity.   Reduction of insulin needs.   Reduced stress and tension.   Helps you feel better.  People with diabetes who add exercise to their lifestyle gain additional benefits, including:  Weight loss.   Reduced appetite.   Improvement of the body's use of blood glucose.   Decreased risk factors for heart disease:   Lowering of cholesterol and triglycerides.   Raising the level of good cholesterol (high-density lipoproteins, HDL).   Lowering blood sugar.   Decreased blood pressure.  TYPE 1 DIABETES AND EXERCISE  Exercise will usually lower your blood glucose.   If  blood glucose is greater than 240 mg/dl, check urine ketones. If ketones are present, do not exercise.   Location of the insulin injection sites may need to be adjusted with exercise. Avoid injecting insulin into areas of the body that will be exercised. For example, avoid injecting insulin into:   The arms when playing tennis.   The legs when jogging. For more information, discuss this with your caregiver.   Keep a record of:   Food intake.   Type and amount of exercise.   Expected peak times of insulin action.   Blood glucose levels.  Do this before, during, and after exercise. Review your records with your caregiver. This will help you to develop guidelines for adjusting food intake and insulin amounts.  TYPE 2 DIABETES AND EXERCISE  Regular physical activity can help control blood glucose.   Exercise is important because it may:   Increase the body's sensitivity to insulin.   Improve blood glucose control.   Exercise reduces the risk of heart disease. It decreases serum cholesterol and triglycerides. It also lowers blood pressure.   Those who take insulin or oral hypoglycemic agents should watch for signs of hypoglycemia. These signs include dizziness, shaking, sweating, chills, and confusion.   Body water is lost during exercise. It must be replaced. This will help to avoid loss of body fluids (dehydration) or heat stroke.  Be sure to talk to your caregiver before starting an exercise program to make sure it is safe for you. Remember, any  activity is better than none.  Document Released: 05/20/2003 Document Revised: 02/16/2011 Document Reviewed: 09/03/2008 [facility] Patient Information 2012 [place].Arterial Hypertension Arterial hypertension (high blood pressure) is a condition of elevated pressure in your blood vessels. Hypertension over a long period of time is a risk factor for strokes, heart attacks, and heart failure. It is also the leading cause of kidney (renal)  failure.  CAUSES   In Adults -- Over 90% of all hypertension has no known cause. This is called essential or primary hypertension. In the other 10% of people with hypertension, the increase in blood pressure is caused by another disorder. This is called secondary hypertension. Important causes of secondary hypertension are:   Heavy alcohol use.   Obstructive sleep apnea.   Hyperaldosterosim (Conn's syndrome).   Steroid use.   Chronic kidney failure.   Hyperparathyroidism.   Medications.   Renal artery stenosis.   Pheochromocytoma.   Cushing's disease.   Coarctation of the aorta.   Scleroderma renal crisis.   Licorice (in excessive amounts).   Drugs (cocaine, methamphetamine).  Your caregiver can explain any items above that apply to you.  In Children -- Secondary hypertension is more common and should always be considered.   Pregnancy -- Few women of childbearing age have high blood pressure. However, up to 10% of them develop hypertension of pregnancy. Generally, this will not harm the woman. It may be a sign of 3 complications of pregnancy: preeclampsia, HELLP syndrome, and eclampsia. Follow up and control with medication is necessary.  SYMPTOMS   This condition normally does not produce any noticeable symptoms. It is usually found during a routine exam.   Malignant hypertension is a late problem of high blood pressure. It may have the following symptoms:   Headaches.   Blurred vision.   End-organ damage (this means your kidneys, heart, lungs, and other organs are being damaged).   Stressful situations can increase the blood pressure. If a person with normal blood pressure has their blood pressure go up while being seen by their caregiver, this is often termed "white coat hypertension." Its importance is not known. It may be related with eventually developing hypertension or complications of hypertension.   Hypertension is often confused with mental tension,  stress, and anxiety.  DIAGNOSIS  The diagnosis is made by 3 separate blood pressure measurements. They are taken at least 1 week apart from each other. If there is organ damage from hypertension, the diagnosis may be made without repeat measurements. Hypertension is usually identified by having blood pressure readings:  Above 140/90 mmHg measured in both arms, at 3 separate times, over a couple weeks.   Over 130/80 mmHg should be considered a risk factor and may require treatment in patients with diabetes.  Blood pressure readings over 120/80 mmHg are called "pre-hypertension" even in non-diabetic patients. To get a true blood pressure measurement, use the following guidelines. Be aware of the factors that can alter blood pressure readings.  Take measurements at least 1 hour after caffeine.   Take measurements 30 minutes after smoking and without any stress. This is another reason to quit smoking - it raises your blood pressure.   Use a proper cuff size. Ask your caregiver if you are not sure about your cuff size.   Most home blood pressure cuffs are automatic. They will measure systolic and diastolic pressures. The systolic pressure is the pressure reading at the start of sounds. Diastolic pressure is the pressure at which the sounds disappear. If you  are elderly, measure pressures in multiple postures. Try sitting, lying or standing.   Sit at rest for a minimum of 5 minutes before taking measurements.   You should not be on any medications like decongestants. These are found in many cold medications.   Record your blood pressure readings and review them with your caregiver.  If you have hypertension:  Your caregiver may do tests to be sure you do not have secondary hypertension (see "causes" above).   Your caregiver may also look for signs of metabolic syndrome. This is also called Syndrome X or Insulin Resistance Syndrome. You may have this syndrome if you have type 2 diabetes,  abdominal obesity, and abnormal blood lipids in addition to hypertension.   Your caregiver will take your medical and family history and perform a physical exam.   Diagnostic tests may include blood tests (for glucose, cholesterol, potassium, and kidney function), a urinalysis, or an EKG. Other tests may also be necessary depending on your condition.  PREVENTION  There are important lifestyle issues that you can adopt to reduce your chance of developing hypertension:  Maintain a normal weight.   Limit the amount of salt (sodium) in your diet.   Exercise often.   Limit alcohol intake.   Get enough potassium in your diet. Discuss specific advice with your caregiver.   Follow a DASH diet (dietary approaches to stop hypertension). This diet is rich in fruits, vegetables, and low-fat dairy products, and avoids certain fats.  PROGNOSIS  Essential hypertension cannot be cured. Lifestyle changes and medical treatment can lower blood pressure and reduce complications. The prognosis of secondary hypertension depends on the underlying cause. Many people whose hypertension is controlled with medicine or lifestyle changes can live a normal, healthy life.  RISKS AND COMPLICATIONS  While high blood pressure alone is not an illness, it often requires treatment due to its short- and long-term effects on many organs. Hypertension increases your risk for:  CVAs or strokes (cerebrovascular accident).   Heart failure due to chronically high blood pressure (hypertensive cardiomyopathy).   Heart attack (myocardial infarction).   Damage to the retina (hypertensive retinopathy).   Kidney failure (hypertensive nephropathy).  Your caregiver can explain list items above that apply to you. Treatment of hypertension can significantly reduce the risk of complications. TREATMENT   For overweight patients, weight loss and regular exercise are recommended. Physical fitness lowers blood pressure.   Mild  hypertension is usually treated with diet and exercise. A diet rich in fruits and vegetables, fat-free dairy products, and foods low in fat and salt (sodium) can help lower blood pressure. Decreasing salt intake decreases blood pressure in a 1/3 of people.   Stop smoking if you are a smoker.  The steps above are highly effective in reducing blood pressure. While these actions are easy to suggest, they are difficult to achieve. Most patients with moderate or severe hypertension end up requiring medications to bring their blood pressure down to a normal level. There are several classes of medications for treatment. Blood pressure pills (antihypertensives) will lower blood pressure by their different actions. Lowering the blood pressure by 10 mmHg may decrease the risk of complications by as much as 25%. The goal of treatment is effective blood pressure control. This will reduce your risk for complications. Your caregiver will help you determine the best treatment for you according to your lifestyle. What is excellent treatment for one person, may not be for you. HOME CARE INSTRUCTIONS   Do not smoke.  Follow the lifestyle changes outlined in the "Prevention" section.   If you are on medications, follow the directions carefully. Blood pressure medications must be taken as prescribed. Skipping doses reduces their benefit. It also puts you at risk for problems.   Follow up with your caregiver, as directed.   If you are asked to monitor your blood pressure at home, follow the guidelines in the "Diagnosis" section above.  SEEK MEDICAL CARE IF:   You think you are having medication side effects.   You have recurrent headaches or lightheadedness.   You have swelling in your ankles.   You have trouble with your vision.  SEEK IMMEDIATE MEDICAL CARE IF:   You have sudden onset of chest pain or pressure, difficulty breathing, or other symptoms of a heart attack.   You have a severe headache.   You  have symptoms of a stroke (such as sudden weakness, difficulty speaking, difficulty walking).  MAKE SURE YOU:   Understand these instructions.   Will watch your condition.   Will get help right away if you are not doing well or get worse.  Document Released: 02/27/2005 Document Revised: 02/16/2011 Document Reviewed: 09/27/2006 [facility] Patient Information 2012 [place].Arterial Hypertension Arterial hypertension (high blood pressure) is a condition of elevated pressure in your blood vessels. Hypertension over a long period of time is a risk factor for strokes, heart attacks, and heart failure. It is also the leading cause of kidney (renal) failure.  CAUSES   In Adults -- Over 90% of all hypertension has no known cause. This is called essential or primary hypertension. In the other 10% of people with hypertension, the increase in blood pressure is caused by another disorder. This is called secondary hypertension. Important causes of secondary hypertension are:   Heavy alcohol use.   Obstructive sleep apnea.   Hyperaldosterosim (Conn's syndrome).   Steroid use.   Chronic kidney failure.   Hyperparathyroidism.   Medications.   Renal artery stenosis.   Pheochromocytoma.   Cushing's disease.   Coarctation of the aorta.   Scleroderma renal crisis.   Licorice (in excessive amounts).   Drugs (cocaine, methamphetamine).  Your caregiver can explain any items above that apply to you.  In Children -- Secondary hypertension is more common and should always be considered.   Pregnancy -- Few women of childbearing age have high blood pressure. However, up to 10% of them develop hypertension of pregnancy. Generally, this will not harm the woman. It may be a sign of 3 complications of pregnancy: preeclampsia, HELLP syndrome, and eclampsia. Follow up and control with medication is necessary.  SYMPTOMS   This condition normally does not produce any noticeable symptoms. It is  usually found during a routine exam.   Malignant hypertension is a late problem of high blood pressure. It may have the following symptoms:   Headaches.   Blurred vision.   End-organ damage (this means your kidneys, heart, lungs, and other organs are being damaged).   Stressful situations can increase the blood pressure. If a person with normal blood pressure has their blood pressure go up while being seen by their caregiver, this is often termed "white coat hypertension." Its importance is not known. It may be related with eventually developing hypertension or complications of hypertension.   Hypertension is often confused with mental tension, stress, and anxiety.  DIAGNOSIS  The diagnosis is made by 3 separate blood pressure measurements. They are taken at least 1 week apart from each other. If there is  organ damage from hypertension, the diagnosis may be made without repeat measurements. Hypertension is usually identified by having blood pressure readings:  Above 140/90 mmHg measured in both arms, at 3 separate times, over a couple weeks.   Over 130/80 mmHg should be considered a risk factor and may require treatment in patients with diabetes.  Blood pressure readings over 120/80 mmHg are called "pre-hypertension" even in non-diabetic patients. To get a true blood pressure measurement, use the following guidelines. Be aware of the factors that can alter blood pressure readings.  Take measurements at least 1 hour after caffeine.   Take measurements 30 minutes after smoking and without any stress. This is another reason to quit smoking - it raises your blood pressure.   Use a proper cuff size. Ask your caregiver if you are not sure about your cuff size.   Most home blood pressure cuffs are automatic. They will measure systolic and diastolic pressures. The systolic pressure is the pressure reading at the start of sounds. Diastolic pressure is the pressure at which the sounds disappear.  If you are elderly, measure pressures in multiple postures. Try sitting, lying or standing.   Sit at rest for a minimum of 5 minutes before taking measurements.   You should not be on any medications like decongestants. These are found in many cold medications.   Record your blood pressure readings and review them with your caregiver.  If you have hypertension:  Your caregiver may do tests to be sure you do not have secondary hypertension (see "causes" above).   Your caregiver may also look for signs of metabolic syndrome. This is also called Syndrome X or Insulin Resistance Syndrome. You may have this syndrome if you have type 2 diabetes, abdominal obesity, and abnormal blood lipids in addition to hypertension.   Your caregiver will take your medical and family history and perform a physical exam.   Diagnostic tests may include blood tests (for glucose, cholesterol, potassium, and kidney function), a urinalysis, or an EKG. Other tests may also be necessary depending on your condition.  PREVENTION  There are important lifestyle issues that you can adopt to reduce your chance of developing hypertension:  Maintain a normal weight.   Limit the amount of salt (sodium) in your diet.   Exercise often.   Limit alcohol intake.   Get enough potassium in your diet. Discuss specific advice with your caregiver.   Follow a DASH diet (dietary approaches to stop hypertension). This diet is rich in fruits, vegetables, and low-fat dairy products, and avoids certain fats.  PROGNOSIS  Essential hypertension cannot be cured. Lifestyle changes and medical treatment can lower blood pressure and reduce complications. The prognosis of secondary hypertension depends on the underlying cause. Many people whose hypertension is controlled with medicine or lifestyle changes can live a normal, healthy life.  RISKS AND COMPLICATIONS  While high blood pressure alone is not an illness, it often requires treatment due  to its short- and long-term effects on many organs. Hypertension increases your risk for:  CVAs or strokes (cerebrovascular accident).   Heart failure due to chronically high blood pressure (hypertensive cardiomyopathy).   Heart attack (myocardial infarction).   Damage to the retina (hypertensive retinopathy).   Kidney failure (hypertensive nephropathy).  Your caregiver can explain list items above that apply to you. Treatment of hypertension can significantly reduce the risk of complications. TREATMENT   For overweight patients, weight loss and regular exercise are recommended. Physical fitness lowers blood pressure.  Mild hypertension is usually treated with diet and exercise. A diet rich in fruits and vegetables, fat-free dairy products, and foods low in fat and salt (sodium) can help lower blood pressure. Decreasing salt intake decreases blood pressure in a 1/3 of people.   Stop smoking if you are a smoker.  The steps above are highly effective in reducing blood pressure. While these actions are easy to suggest, they are difficult to achieve. Most patients with moderate or severe hypertension end up requiring medications to bring their blood pressure down to a normal level. There are several classes of medications for treatment. Blood pressure pills (antihypertensives) will lower blood pressure by their different actions. Lowering the blood pressure by 10 mmHg may decrease the risk of complications by as much as 25%. The goal of treatment is effective blood pressure control. This will reduce your risk for complications. Your caregiver will help you determine the best treatment for you according to your lifestyle. What is excellent treatment for one person, may not be for you. HOME CARE INSTRUCTIONS   Do not smoke.   Follow the lifestyle changes outlined in the "Prevention" section.   If you are on medications, follow the directions carefully. Blood pressure medications must be taken  as prescribed. Skipping doses reduces their benefit. It also puts you at risk for problems.   Follow up with your caregiver, as directed.   If you are asked to monitor your blood pressure at home, follow the guidelines in the "Diagnosis" section above.  SEEK MEDICAL CARE IF:   You think you are having medication side effects.   You have recurrent headaches or lightheadedness.   You have swelling in your ankles.   You have trouble with your vision.  SEEK IMMEDIATE MEDICAL CARE IF:   You have sudden onset of chest pain or pressure, difficulty breathing, or other symptoms of a heart attack.   You have a severe headache.   You have symptoms of a stroke (such as sudden weakness, difficulty speaking, difficulty walking).  MAKE SURE YOU:   Understand these instructions.   Will watch your condition.   Will get help right away if you are not doing well or get worse.  Document Released: 02/27/2005 Document Revised: 02/16/2011 Document Reviewed: 09/27/2006 [facility] Patient Information 2012 [place].

## 2011-07-25 NOTE — ED Provider Notes (Signed)
Medical screening examination/treatment/procedure(s) were performed by non-physician practitioner and as supervising physician I was immediately available for consultation/collaboration.  Richarda Blade, MD 07/25/11 [phone]

## 2011-11-27 ENCOUNTER — Emergency Department (HOSPITAL_COMMUNITY)
Admission: EM | Admit: 2011-11-27 | Discharge: 2011-11-27 | Disposition: A | Discharge status: Home / Self Care | Payer: Self-pay | Attending: Emergency Medicine | Admitting: Emergency Medicine

## 2011-11-27 ENCOUNTER — Encounter (HOSPITAL_COMMUNITY): Payer: Self-pay

## 2011-11-27 ENCOUNTER — Emergency Department (HOSPITAL_COMMUNITY): Payer: Self-pay

## 2011-11-27 DIAGNOSIS — S6990XA Unspecified injury of unspecified wrist, hand and finger(s), initial encounter: Secondary | ICD-10-CM | POA: Insufficient documentation

## 2011-11-27 DIAGNOSIS — E119 Type 2 diabetes mellitus without complications: Secondary | ICD-10-CM | POA: Insufficient documentation

## 2011-11-27 DIAGNOSIS — W19XXXA Unspecified fall, initial encounter: Secondary | ICD-10-CM | POA: Insufficient documentation

## 2011-11-27 DIAGNOSIS — Z79899 Other long term (current) drug therapy: Secondary | ICD-10-CM | POA: Insufficient documentation

## 2011-11-27 DIAGNOSIS — I10 Essential (primary) hypertension: Secondary | ICD-10-CM | POA: Insufficient documentation

## 2011-11-27 MED ORDER — LISINOPRIL 20 MG PO TABS: 20.0000 mg | ORAL_TABLET | Freq: Every day | ORAL | Status: DC

## 2011-11-27 MED ORDER — IBUPROFEN 200 MG PO TABS
ORAL_TABLET | ORAL | Status: AC
  Filled 2011-11-27: qty 4

## 2011-11-27 MED ORDER — METFORMIN HCL 500 MG PO TABS: 500.0000 mg | ORAL_TABLET | Freq: Two times a day (BID) | ORAL | Status: DC

## 2011-11-27 MED ORDER — IBUPROFEN 400 MG PO TABS
800.0000 mg | ORAL_TABLET | Freq: Once | ORAL | Status: AC
  Administered 2011-11-27: 800 mg via ORAL

## 2011-11-27 NOTE — Progress Notes (Signed)
Orthopedic Tech Progress Note Patient Details:  Gianelly Sime 11-Jun-1960 [id number]  Ortho Devices Type of Ortho Device: Ace wrap;Ulna gutter splint Ortho Device/Splint Location: (L) UE Ortho Device/Splint Interventions: Application   Braulio Bosch 11/27/2011, 10:51 PM

## 2011-11-27 NOTE — ED Provider Notes (Signed)
History  This chart was scribed for Gina Arthurs, MD by Gina [name]. This patient was seen in room TR09C/TR09C and the patient's care was started at 2213.  CSN: [id number]  Arrival date & time 11/27/11  [id number]   First MD Initiated Contact with Patient 11/27/11 2213      Chief Complaint  Patient presents with  . Hand Pain    The history is provided by the patient. No language interpreter was used.    Gina [name] is a 51 y.o. female who presents to the Emergency Department complaining of 7 days of left hand pain after a trip and fall. She states that she landed on her left hand. The pain is worse with use. She states that she has been taking benadryl for the pain with no improvement in her symptoms but denies any other OTC medications. She is also requesting a refill for her HTN and DM medications stating that she has been out for the past 2 weeks. She states that she [facility]. She also reports increased frequency attributed to the DM, but she denies chills, fever, nausea, emesis and rash as associated symptoms. She denies smoking and alcohol use.  No current PCP.  Past Medical History  Diagnosis Date  . Hypertension   . Diabetes mellitus     Past Surgical History  Procedure Date  . Tubal ligation     History reviewed. No pertinent family history.  History  Substance Use Topics  . Smoking status: Never Smoker   . Smokeless tobacco: Not on file  . Alcohol Use: No   No OB history provided.  Review of Systems  Constitutional: Negative for fever and chills.  Gastrointestinal: Negative for nausea and vomiting.  Musculoskeletal: Negative for back pain.       Positive left hand pain  Skin: Negative for rash.    Allergies  Review of patient's allergies indicates no known allergies.  Home Medications   Current Outpatient Rx  Name Route Sig Dispense Refill  . LISINOPRIL 20 MG PO TABS Oral Take 1 tablet (20 mg total) by  mouth daily. 30 tablet 0  . METFORMIN HCL 500 MG PO TABS Oral Take 1 tablet (500 mg total) by mouth 2 (two) times daily with a meal. 60 tablet 0    Triage Vitals: BP 172/91  Pulse 89  Temp 98.6 F (37 C) (Oral)  Resp 16  SpO2 97%  LMP 11/05/2011  Physical Exam  Nursing note and vitals reviewed. Constitutional: She is oriented to person, place, and time. She appears well-developed and well-nourished. No distress.  HENT:  Head: Normocephalic and atraumatic.  Eyes: EOM are normal.  Neck: Neck supple. No tracheal deviation present.  Cardiovascular: Normal rate.   Pulmonary/Chest: Effort normal. No respiratory distress.  Musculoskeletal: Normal range of motion.       Tenderness along the 4th and 5th metacarpal, worse with the 5th, Good capillary refill  Neurological: She is alert and oriented to person, place, and time.  Skin: Skin is warm and dry.  Psychiatric: She has a normal mood and affect. Her behavior is normal.    ED Course  Procedures (including critical care time)  DIAGNOSTIC STUDIES: Oxygen Saturation is 97% on room air, adequate by my interpretation.    COORDINATION OF CARE: 2218-Informed pt that her radiology result was negative. Discussed discharge plan with pt at bedside and pt agreed to plan. Will refill her medications.    Labs Reviewed -  No data to display Dg Hand Complete Left  11/27/2011  *RADIOLOGY REPORT*  Clinical Data: Pain and swelling after a fall.  LEFT HAND - COMPLETE 3+ VIEW  Comparison: None.  Findings: There is what appears be an old fracture of the base of the distal phalangeal bone of the thumb with secondary residual deformity.  There are no acute fractures or dislocations.  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 Minimal osteoarthritic changes of the DIP joints of the fingers and IP joint of the thumb.  IMPRESSION: No acute abnormality.   Original Report  Authenticated By: Larey Seat, M.D.      1. Hand injury       MDM  Gina [name] is a 51 y.o. female here with L hand pain s/p fall. No fracture on xray. Ulnar gutter splint applied for comfort. Patient will take motrin for pain. Since she hasn't been taking her medicines for over a month, the pharmacist is unable to get med rec. Will give her low doses of her lisinopril and metformin. She is given a Warden/ranger for follow up.    This document was completed by the scribe at my direction and I have reviewed its accuracy. I have personally examined the patient and agrees with the above document.   Gina Goltz, MD     Gina Arthurs, MD 11/27/11 [phone]

## 2011-11-27 NOTE — ED Notes (Signed)
Pt reports (L) hand pain, reports she tripped and fell Saturday night landing on her (L) hand.

## 2012-06-04 ENCOUNTER — Encounter (HOSPITAL_COMMUNITY): Payer: Self-pay | Admitting: Emergency Medicine

## 2012-06-04 ENCOUNTER — Emergency Department (HOSPITAL_COMMUNITY)
Admission: EM | Admit: 2012-06-04 | Discharge: 2012-06-04 | Disposition: A | Discharge status: Home / Self Care | Payer: Self-pay | Attending: Emergency Medicine | Admitting: Emergency Medicine

## 2012-06-04 ENCOUNTER — Emergency Department (HOSPITAL_COMMUNITY): Payer: Self-pay

## 2012-06-04 DIAGNOSIS — M25511 Pain in right shoulder: Secondary | ICD-10-CM

## 2012-06-04 DIAGNOSIS — Y9269 Other specified industrial and construction area as the place of occurrence of the external cause: Secondary | ICD-10-CM | POA: Insufficient documentation

## 2012-06-04 DIAGNOSIS — Y990 Civilian activity done for income or pay: Secondary | ICD-10-CM | POA: Insufficient documentation

## 2012-06-04 DIAGNOSIS — M25519 Pain in unspecified shoulder: Secondary | ICD-10-CM | POA: Insufficient documentation

## 2012-06-04 DIAGNOSIS — I10 Essential (primary) hypertension: Secondary | ICD-10-CM | POA: Insufficient documentation

## 2012-06-04 DIAGNOSIS — R209 Unspecified disturbances of skin sensation: Secondary | ICD-10-CM | POA: Insufficient documentation

## 2012-06-04 DIAGNOSIS — X503XXA Overexertion from repetitive movements, initial encounter: Secondary | ICD-10-CM | POA: Insufficient documentation

## 2012-06-04 DIAGNOSIS — Z79899 Other long term (current) drug therapy: Secondary | ICD-10-CM | POA: Insufficient documentation

## 2012-06-04 DIAGNOSIS — E119 Type 2 diabetes mellitus without complications: Secondary | ICD-10-CM | POA: Insufficient documentation

## 2012-06-04 MED ORDER — IBUPROFEN 600 MG PO TABS: 600.0000 mg | ORAL_TABLET | Freq: Four times a day (QID) | ORAL | Status: DC | PRN

## 2012-06-04 MED ORDER — HYDROCODONE-ACETAMINOPHEN 5-325 MG PO TABS: 1.0000 | ORAL_TABLET | ORAL | Status: DC | PRN

## 2012-06-04 NOTE — ED Provider Notes (Signed)
History    This chart was scribed for non-physician practitioner, Clayton Bibles working with Dot Lanes, MD by Rhae Lerner, ED scribe. This patient was seen in room TR11C/TR11C and the patient's care was started at 6:48 PM.   CSN: [id number]  Arrival date & time 06/04/12  1642      Chief Complaint  Patient presents with  . Shoulder Pain     The history is provided by the patient. No language interpreter was used.   Gina [name] is a 52 y.o. female who presents to the Emergency Department complaining of constant, moderate, aching, right shoulder pain onset 2 days ago. Pain is rated at 7/10. She reports that at work she was moving objects and noticed the pain 1 day later. Movement of right shoulder aggravates the pain. Denies hx of shoulder pain. She reports having tingling sensation in right arm when she awoke today. She has taken ibuprofen with minor relief. Pt denies fever, chills, weakness, neck pain, chest pain, SOB.      Past Medical History  Diagnosis Date  . Hypertension   . Diabetes mellitus     Past Surgical History  Procedure Laterality Date  . Tubal ligation      No family history on file.  History  Substance Use Topics  . Smoking status: Never Smoker   . Smokeless tobacco: Not on file  . Alcohol Use: Yes    OB History   Grav Para Term Preterm Abortions TAB SAB Ect Mult Living                  Review of Systems  Constitutional: Negative for fever and chills.  Respiratory: Negative for cough and shortness of breath.   Gastrointestinal: Negative for nausea, vomiting and diarrhea.  Musculoskeletal: Positive for arthralgias.  Neurological: Negative for weakness.    Allergies  Review of patient's allergies indicates no known allergies.  Home Medications   Current Outpatient Rx  Name  Route  Sig  Dispense  Refill  . lisinopril (PRINIVIL,ZESTRIL) 20 MG tablet   Oral   Take 1 tablet (20 mg total) by mouth daily.   30 tablet   0   . metFORMIN  (GLUCOPHAGE) 500 MG tablet   Oral   Take 1 tablet (500 mg total) by mouth 2 (two) times daily with a meal.   60 tablet   0     BP 152/82  Pulse 80  Temp(Src) 98.2 F (36.8 C) (Oral)  Resp 18  SpO2 100%  LMP 06/04/2012  Physical Exam  Nursing note and vitals reviewed. Constitutional: She appears well-developed and well-nourished. No distress.  HENT:  Head: Normocephalic and atraumatic.  Neck: Neck supple.  Pulmonary/Chest: Effort normal.  Musculoskeletal:  Right Shoulder: Tender over right deltoid Tender over right anterior shoulder No AC joint tenderness Diffuse muscular tenderness of right shoulder and upper right arm  Distal pulses intact Cap refill less than 2 seconds in all digits Distal sensation intact Doesn't raise right shoulder higher than 90 degrees secondary to pain. Pain with internal and external rotation Right arm strength 5/5, sensation intact, distal pulses intact.   Spine: nontender, no crepitus, no step offs   Neurological: She is alert.  Skin: Skin is warm and dry. She is not diaphoretic.  Psychiatric: She has a normal mood and affect. Her behavior is normal.    ED Course  Procedures (including critical care time) DIAGNOSTIC STUDIES: Oxygen Saturation is 100% on room air, normal by my interpretation.  COORDINATION OF CARE: 6:54 PM Discussed ED treatment with pt and pt agrees.    Labs Reviewed - No data to display Dg Shoulder Right  06/04/2012  *RADIOLOGY REPORT*  Clinical Data: Right shoulder pain  RIGHT SHOULDER - 2+ VIEW  Comparison: None.  Findings: Normal alignment without fracture, subluxation, or dislocation.  Bony spurring of the acromion undersurface could result in shoulder impingement syndrome.  AC joint aligned. Visualized right lung clear.  Right ribs appear intact as well.  IMPRESSION: No acute osseous finding  Inferior surface acromial spurring could result in shoulder impingement.   Original Report Authenticated By: Jerilynn Mages. Shick,  M.D.      1. Shoulder pain, acute, right       MDM  Pt with acute right shoulder pain after heavy lifting at work last weekend.  Muscle tenderness on exam.  Neurovascularly intact.  Spurring on xray.  No spinal tenderness.  Likely muscle strain.  Pt d/c home with pain medications.  PCP follow up.  Discussed all results with patient.  Pt given return precautions.  Pt verbalizes understanding and agrees with plan.      I personally performed the services described in this documentation, which was scribed in my presence. The recorded information has been reviewed and is accurate.    Clayton Bibles, PA-C 06/04/12 2359

## 2012-06-04 NOTE — ED Notes (Signed)
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  Full range of motion right upper extremity with pain increasing with movement.  Pain currently 7/10 achy at rest. Full sensation radial pulse +2.

## 2012-06-05 NOTE — ED Provider Notes (Signed)
Medical screening examination/treatment/procedure(s) were performed by non-physician practitioner and as supervising physician I was immediately available for consultation/collaboration.   Gina Lanes, MD 06/05/12 [phone]

## 2012-07-30 DIAGNOSIS — Z9119 Patient's noncompliance with other medical treatment and regimen: Secondary | ICD-10-CM

## 2012-07-30 DIAGNOSIS — Z794 Long term (current) use of insulin: Secondary | ICD-10-CM

## 2012-07-30 DIAGNOSIS — IMO0001 Reserved for inherently not codable concepts without codable children: Secondary | ICD-10-CM | POA: Diagnosis present

## 2012-07-30 DIAGNOSIS — N764 Abscess of vulva: Principal | ICD-10-CM | POA: Diagnosis present

## 2012-07-30 DIAGNOSIS — B372 Candidiasis of skin and nail: Secondary | ICD-10-CM | POA: Diagnosis present

## 2012-07-30 DIAGNOSIS — D649 Anemia, unspecified: Secondary | ICD-10-CM | POA: Diagnosis present

## 2012-07-30 DIAGNOSIS — I10 Essential (primary) hypertension: Secondary | ICD-10-CM | POA: Diagnosis present

## 2012-07-30 DIAGNOSIS — Z91199 Patient's noncompliance with other medical treatment and regimen due to unspecified reason: Secondary | ICD-10-CM

## 2012-07-31 ENCOUNTER — Inpatient Hospital Stay (HOSPITAL_COMMUNITY)
Admission: EM | Admit: 2012-07-31 | Discharge: 2012-08-03 | DRG: 759 | Disposition: A | Discharge status: Home / Self Care | Payer: MEDICAID | Attending: Internal Medicine | Admitting: Internal Medicine

## 2012-07-31 ENCOUNTER — Encounter (HOSPITAL_COMMUNITY): Payer: Self-pay | Admitting: *Deleted

## 2012-07-31 DIAGNOSIS — E1165 Type 2 diabetes mellitus with hyperglycemia: Secondary | ICD-10-CM

## 2012-07-31 DIAGNOSIS — R739 Hyperglycemia, unspecified: Secondary | ICD-10-CM

## 2012-07-31 DIAGNOSIS — L0291 Cutaneous abscess, unspecified: Secondary | ICD-10-CM

## 2012-07-31 DIAGNOSIS — E119 Type 2 diabetes mellitus without complications: Secondary | ICD-10-CM | POA: Diagnosis present

## 2012-07-31 DIAGNOSIS — D649 Anemia, unspecified: Secondary | ICD-10-CM | POA: Diagnosis present

## 2012-07-31 DIAGNOSIS — N739 Female pelvic inflammatory disease, unspecified: Secondary | ICD-10-CM

## 2012-07-31 DIAGNOSIS — L0390 Cellulitis, unspecified: Secondary | ICD-10-CM

## 2012-07-31 DIAGNOSIS — B372 Candidiasis of skin and nail: Secondary | ICD-10-CM | POA: Diagnosis present

## 2012-07-31 DIAGNOSIS — IMO0001 Reserved for inherently not codable concepts without codable children: Secondary | ICD-10-CM

## 2012-07-31 DIAGNOSIS — N764 Abscess of vulva: Secondary | ICD-10-CM

## 2012-07-31 LAB — CBC WITH DIFFERENTIAL/PLATELET
Basophils Relative: 0 % (ref 0–1)
Eosinophils Absolute: 0.1 10*3/uL (ref 0.0–0.7)
Eosinophils Relative: 1 % (ref 0–5)
Hemoglobin: 10.1 g/dL — ABNORMAL LOW (ref 12.0–15.0)
Lymphs Abs: 2.6 10*3/uL (ref 0.7–4.0)
MCH: 31 pg (ref 26.0–34.0)
MCHC: 34.9 g/dL (ref 30.0–36.0)
MCV: 88.7 fL (ref 78.0–100.0)
Monocytes Absolute: 0.8 10*3/uL (ref 0.1–1.0)
Neutrophils Relative %: 67 % (ref 43–77)
Platelets: 155 10*3/uL (ref 150–400)
RBC: 3.26 MIL/uL — ABNORMAL LOW (ref 3.87–5.11)
RDW: 12.7 % (ref 11.5–15.5)

## 2012-07-31 LAB — BASIC METABOLIC PANEL
CO2: 22 mEq/L (ref 19–32)
Calcium: 7.7 mg/dL — ABNORMAL LOW (ref 8.4–10.5)
Creatinine, Ser: 0.49 mg/dL — ABNORMAL LOW (ref 0.50–1.10)
Glucose, Bld: 309 mg/dL — ABNORMAL HIGH (ref 70–99)
Potassium: 3.8 mEq/L (ref 3.5–5.1)

## 2012-07-31 LAB — GLUCOSE, CAPILLARY
Glucose-Capillary: 377 mg/dL — ABNORMAL HIGH (ref 70–99)
Glucose-Capillary: 369 mg/dL — ABNORMAL HIGH (ref 70–99)
Glucose-Capillary: 153 mg/dL — ABNORMAL HIGH (ref 70–99)
Glucose-Capillary: 124 mg/dL — ABNORMAL HIGH (ref 70–99)

## 2012-07-31 LAB — COMPREHENSIVE METABOLIC PANEL
ALT: 37 U/L — ABNORMAL HIGH (ref 0–35)
Albumin: 3.3 g/dL — ABNORMAL LOW (ref 3.5–5.2)
Calcium: 8.8 mg/dL (ref 8.4–10.5)
Creatinine, Ser: 0.56 mg/dL (ref 0.50–1.10)
GFR calc Af Amer: >90 mL/min (ref >90)
GFR calc non Af Amer: >90 mL/min (ref >90)
Glucose, Bld: 590 mg/dL (ref 70–99)
Potassium: 4 mEq/L (ref 3.5–5.1)
Sodium: 127 mEq/L — ABNORMAL LOW (ref 135–145)
Total Protein: 6.8 g/dL (ref 6.0–8.3)

## 2012-07-31 LAB — CBC
HCT: 24.5 % — ABNORMAL LOW (ref 36.0–46.0)
Hemoglobin: 8.5 g/dL — ABNORMAL LOW (ref 12.0–15.0)
MCH: 30.9 pg (ref 26.0–34.0)
MCV: 89.1 fL (ref 78.0–100.0)
RBC: 2.75 MIL/uL — ABNORMAL LOW (ref 3.87–5.11)

## 2012-07-31 LAB — HEMOGLOBIN A1C: Hgb A1c MFr Bld: 13.3 % — ABNORMAL HIGH (ref <5.7)

## 2012-07-31 MED ORDER — INSULIN ASPART 100 UNIT/ML ~~LOC~~ SOLN
0.0000 [IU] | Freq: Three times a day (TID) | SUBCUTANEOUS | Status: DC
  Administered 2012-07-31: 15 [IU] via SUBCUTANEOUS
  Administered 2012-07-31: 8 [IU] via SUBCUTANEOUS
  Administered 2012-07-31: 3 [IU] via SUBCUTANEOUS
  Administered 2012-08-01: 11 [IU] via SUBCUTANEOUS
  Administered 2012-08-01: 5 [IU] via SUBCUTANEOUS
  Administered 2012-08-02 – 2012-08-03 (×3): 2 [IU] via SUBCUTANEOUS

## 2012-07-31 MED ORDER — LIVING WELL WITH DIABETES BOOK
Freq: Once | Status: AC
  Administered 2012-07-31: 15:00:00
  Filled 2012-07-31: qty 1

## 2012-07-31 MED ORDER — ONDANSETRON HCL 4 MG PO TABS: 4.0000 mg | ORAL_TABLET | Freq: Four times a day (QID) | ORAL | Status: DC | PRN

## 2012-07-31 MED ORDER — SODIUM CHLORIDE 0.9 % IV SOLN
INTRAVENOUS | Status: DC
  Administered 2012-07-31: 07:00:00 via INTRAVENOUS

## 2012-07-31 MED ORDER — CLINDAMYCIN PHOSPHATE 600 MG/50ML IV SOLN
600.0000 mg | Freq: Once | INTRAVENOUS | Status: AC
  Administered 2012-07-31: 600 mg via INTRAVENOUS
  Filled 2012-07-31: qty 50

## 2012-07-31 MED ORDER — SODIUM CHLORIDE 0.9 % IV SOLN: INTRAVENOUS | Status: DC

## 2012-07-31 MED ORDER — SODIUM CHLORIDE 0.9 % IV SOLN
1000.0000 mL | Freq: Once | INTRAVENOUS | Status: AC
  Administered 2012-07-31: 1000 mL via INTRAVENOUS

## 2012-07-31 MED ORDER — INSULIN REGULAR BOLUS VIA INFUSION
0.0000 [IU] | Freq: Three times a day (TID) | INTRAVENOUS | Status: DC
  Filled 2012-07-31: qty 10

## 2012-07-31 MED ORDER — SODIUM CHLORIDE 0.9 % IJ SOLN: 3.0000 mL | Freq: Two times a day (BID) | INTRAMUSCULAR | Status: DC

## 2012-07-31 MED ORDER — FLUCONAZOLE 100 MG PO TABS
50.0000 mg | ORAL_TABLET | Freq: Once | ORAL | Status: AC
  Administered 2012-07-31: 50 mg via ORAL
  Filled 2012-07-31: qty 1

## 2012-07-31 MED ORDER — ZOLPIDEM TARTRATE 5 MG PO TABS: 5.0000 mg | ORAL_TABLET | Freq: Every evening | ORAL | Status: DC | PRN

## 2012-07-31 MED ORDER — INSULIN GLARGINE 100 UNIT/ML ~~LOC~~ SOLN
10.0000 [IU] | Freq: Every day | SUBCUTANEOUS | Status: DC
  Administered 2012-07-31 – 2012-08-01 (×2): 10 [IU] via SUBCUTANEOUS
  Filled 2012-07-31 (×2): qty 0.1

## 2012-07-31 MED ORDER — DEXTROSE-NACL 5-0.45 % IV SOLN: INTRAVENOUS | Status: DC

## 2012-07-31 MED ORDER — SODIUM CHLORIDE 0.9 % IV SOLN
1000.0000 mL | INTRAVENOUS | Status: DC
  Administered 2012-07-31: 1000 mL via INTRAVENOUS

## 2012-07-31 MED ORDER — ACETAMINOPHEN 650 MG RE SUPP: 650.0000 mg | Freq: Four times a day (QID) | RECTAL | Status: DC | PRN

## 2012-07-31 MED ORDER — PNEUMOCOCCAL VAC POLYVALENT 25 MCG/0.5ML IJ INJ: 0.5000 mL | INJECTION | INTRAMUSCULAR | Status: DC | PRN

## 2012-07-31 MED ORDER — SODIUM CHLORIDE 0.9 % IV SOLN
INTRAVENOUS | Status: DC
  Administered 2012-07-31 – 2012-08-02 (×4): via INTRAVENOUS

## 2012-07-31 MED ORDER — FLUCONAZOLE 150 MG PO TABS
150.0000 mg | ORAL_TABLET | Freq: Every day | ORAL | Status: DC
  Administered 2012-08-01 – 2012-08-03 (×3): 150 mg via ORAL
  Filled 2012-07-31 (×3): qty 1

## 2012-07-31 MED ORDER — ACETAMINOPHEN 325 MG PO TABS: 650.0000 mg | ORAL_TABLET | Freq: Four times a day (QID) | ORAL | Status: DC | PRN

## 2012-07-31 MED ORDER — OXYCODONE HCL 5 MG PO TABS
5.0000 mg | ORAL_TABLET | ORAL | Status: DC | PRN
  Administered 2012-08-01: 5 mg via ORAL
  Filled 2012-07-31: qty 1

## 2012-07-31 MED ORDER — DEXTROSE 50 % IV SOLN: 25.0000 mL | INTRAVENOUS | Status: DC | PRN

## 2012-07-31 MED ORDER — HYDROMORPHONE HCL PF 1 MG/ML IJ SOLN
0.5000 mg | INTRAMUSCULAR | Status: DC | PRN
  Administered 2012-07-31 – 2012-08-01 (×2): 1 mg via INTRAVENOUS
  Filled 2012-07-31 (×2): qty 1

## 2012-07-31 MED ORDER — SODIUM CHLORIDE 0.9 % IV SOLN
INTRAVENOUS | Status: DC
  Filled 2012-07-31 (×2): qty 1

## 2012-07-31 MED ORDER — SODIUM CHLORIDE 0.9 % IV SOLN
INTRAVENOUS | Status: DC
  Administered 2012-07-31: 3.2 [IU]/h via INTRAVENOUS
  Filled 2012-07-31: qty 1

## 2012-07-31 MED ORDER — ALUM & MAG HYDROXIDE-SIMETH 200-200-20 MG/5ML PO SUSP: 30.0000 mL | Freq: Four times a day (QID) | ORAL | Status: DC | PRN

## 2012-07-31 MED ORDER — ONDANSETRON HCL 4 MG/2ML IJ SOLN
4.0000 mg | Freq: Four times a day (QID) | INTRAMUSCULAR | Status: DC | PRN
  Administered 2012-07-31: 4 mg via INTRAVENOUS
  Filled 2012-07-31: qty 2

## 2012-07-31 MED ORDER — CLINDAMYCIN PHOSPHATE 600 MG/50ML IV SOLN
600.0000 mg | Freq: Three times a day (TID) | INTRAVENOUS | Status: DC
  Administered 2012-07-31 – 2012-08-03 (×9): 600 mg via INTRAVENOUS
  Filled 2012-07-31 (×11): qty 50

## 2012-07-31 NOTE — Progress Notes (Signed)
Diabetic education provided. Patient reading "Living with Diabetes" book; questions answered. Will monitor. Call bell near. Cindee Salt

## 2012-07-31 NOTE — Progress Notes (Signed)
Utilization Review Completed.Donne Anon T5/21/2014

## 2012-07-31 NOTE — Progress Notes (Signed)
Inpatient Diabetes Program Recommendations  AACE/ADA: New Consensus Statement on Inpatient Glycemic Control (2013)  Target Ranges:  Prepandial:   less than 140 mg/dL      Peak postprandial:   less than 180 mg/dL (1-2 hours)      Critically ill patients:  140 - 180 mg/dL   Pt on IV insulin drip per GS.  Drip discontinued at 0544 this am, but no correction nor basal given until 0950. Glucose level at 369 mg/dL mid day today.  Inpatient Diabetes Program Recommendations Correction (SSI): Please consider changing correction to q 4 hrs until glucose controlled.  Thank you, Rosita Kea, RN, CNS, Diabetes Coordinator [phone])

## 2012-07-31 NOTE — Care Management Note (Signed)
    Page 1 of 2   08/02/2012     3:50:18 PM   CARE MANAGEMENT NOTE 08/02/2012  Patient:  TIMARA, DOHNER   Account Number:  [id number]  Date Initiated:  07/31/2012  Documentation initiated by:  Hilja Kintzel  Subjective/Objective Assessment:   PT ADM ON 07/30/12 WITH VAGINAL ABSCESS.  PTA, PT RESIDES AT [facility].     Action/Plan:   PT REPORTED TO RN THAT SHE COULD NOT RETURN TO BROTHER'S HOME AT [facility]   Anticipated DC Date:  08/02/2012   Anticipated DC Plan:  [place]  CM consult  Medication Assistance  [facility]      Choice offered to / List presented to:             Status of service:  Completed, signed off Medicare Important Message given?   (If response is "NO", the following Medicare IM given date fields will be blank) Date Medicare IM given:   Date Additional Medicare IM given:    Discharge Disposition:  HOME/SELF CARE  Per UR Regulation:  Reviewed for med. necessity/level of care/duration of stay  If discussed at [place] of Stay Meetings, dates discussed:    Comments:  08/02/12 Leemon Ayala,RN,BSN [id number] [place].  PT'S TEMP AGENCY FAXED FINANCIAL DOCUMENTS, AND FAXED COMPLETED APPLICATION TO [facility] [place].  PT GIVEN MATCH LETTER WITH EXPLANATION OF PROGRAM BENEFITS.  PT INSTRUCTED TO TAKE COPY OF ASSISTANCE FORM TO [place] [facility] ON 6/5, SO THAT THEY MAY FOLLOW UP ON PROGRESS.  PT APPRECIATIVE OF HELP GIVEN.  08/01/12 Ashten Sarnowski,RN,BSN [id number] APPT MADE FOR PT AT [facility].  APPT IS JUNE 5 AT 10:00.  INFORMATION ENTERED ON AVS.  PT TO DC ON LEVEMIR INSULIN...HAS NO INSURANCE. INITIATED LEVEMIR ASSISTANCE PROGRAM FORMS.  ATTEMPTED TO MEET WITH PT TO DISCUSS PROGRAM, BUT SHE WAS ON THE PHONE, AND TOLD ME TO COME BACK.  Peytin Dechert,RN,BSN [id number] 07/31/12 PT STATES SHE AND HER BROTHER  SOMETIMES DON'T GET ALONG, AND HE GETS ANGRY AT HER.  SHE STATES SHE PLANS TO GO TO HER FRIEND'S HOUSE TO STAY.  SHE DOES NOT WANT TO GO TO THE [facility].  SHE IS HOPEFUL THAT THEY WILL BE ABLE TO MAKE UP, AND SHE CAN GO BACK TO HER BROTHER'S HOME.  SHE CURRENTLY IS WORKING BUT STATES SHE STILL STRUGGLES.  WILL FOLLOW FOR MED ASSISTANCE, AS SHE APPEARS TO BE ELIGIBLE FOR MATCH PROGRAM.

## 2012-07-31 NOTE — ED Notes (Signed)
BS 257

## 2012-07-31 NOTE — ED Provider Notes (Signed)
Medical screening examination/treatment/procedure(s) were performed by non-physician practitioner and as supervising physician I was immediately available for consultation/collaboration.  Veryl Speak, MD 07/31/12 [phone]

## 2012-07-31 NOTE — Progress Notes (Signed)
Pt seen and examined, admitted this am Admitted with uncontrolled DM and Labial cellulitis Continue IV Clindamycin, warm compresses Lantus, SSI, DM coordinator consult Will d/w Gyn on call  Domenic Polite, MD [phone]

## 2012-07-31 NOTE — H&P (Signed)
[facility] History and Physical  Hemi Deese [id number] DOB: Sep 23, 1960 DOA: 07/31/2012  Referring physician: EDP PCP: Default, Provider, MD  Specialists:   Chief Complaint: Boil Down There  HPI: Gina [name] is a 52 y.o. female with  A history of DM2 and poor compliance with medical therapy who presents with complaints of a boil down there on her private area.  She reports having increased pain and could not walk due to the pain.  She reports that the boils has been draining yellowish fluid.  She denies having fevers or chills.   She was found to have a glucose level of 600 in the ED but was not in DKA, she reports taking her diabetes medication sometime, and she does not check her blood sugars.  In the ED, she was placed on the IV Insulin drip (Glucose Stabilizer) and given IV Clindamycin ad PO diflucan and referred for admission.     Review of Systems: The patient denies anorexia, fever, weight loss, vision loss, decreased hearing, hoarseness, chest pain, syncope, dyspnea on exertion, peripheral edema, balance deficits, hemoptysis, abdominal pain, melena, hematochezia, severe indigestion/heartburn, hematuria, incontinence, muscle weakness, suspicious skin lesions, transient blindness, difficulty walking, depression, unusual weight change, abnormal bleeding, enlarged lymph nodes, angioedema, and breast masses.    Past Medical History  Diagnosis Date  . Hypertension   . Diabetes mellitus      Past Surgical History  Procedure Laterality Date  . Tubal ligation      Medications:  HOME MEDS: Prior to Admission medications   Medication Sig Start Date End Date Taking? Authorizing Provider  lisinopril (PRINIVIL,ZESTRIL) 20 MG tablet Take 1 tablet (20 mg total) by mouth daily. 11/27/11  Yes Wandra Arthurs, MD  metFORMIN (GLUCOPHAGE) 500 MG tablet Take 1 tablet (500 mg total) by mouth 2 (two) times daily with a meal. 11/27/11  Yes Wandra Arthurs, MD    Allergies:  No Known  Allergies  Social History:   reports that she has never smoked. She does not have any smokeless tobacco history on file. She reports that  drinks alcohol. She does not use illicit drugs.  Family History: Family History  Problem Relation Age of Onset  . Diabetes type II Father   . Diabetes Mellitus II Brother   . CAD Father   . Prostate cancer Father      Physical Exam:  GEN: Thin well developed 52 year old African American Female  examined  and in no acute distress; cooperative with exam Filed Vitals:   07/31/12 0530 07/31/12 0545 07/31/12 0615 07/31/12 0702  BP: 105/61  119/64 106/56  Pulse: 78 104 84 80  Temp:    98.4 F (36.9 C)  TempSrc:    Oral  Resp:   18 20  Height:      Weight:    74.934 kg (165 lb 3.2 oz)  SpO2: 98% 100% 95% 99%   Blood pressure 106/56, pulse 80, temperature 98.4 F (36.9 C), temperature source Oral, resp. rate 20, height 5\' 3"  (1.6 m), weight 74.934 kg (165 lb 3.2 oz), SpO2 99.00%. PSYCH: She is alert and oriented x4; does not appear anxious does not appear depressed; affect is normal HEENT: Normocephalic and Atraumatic, Mucous membranes pink; PERRLA; EOM intact; Fundi:  Benign;  No scleral icterus, Nares: Patent, Oropharynx: Clear,  Fair Dentition, Neck:  FROM, no cervical lymphadenopathy nor thyromegaly or carotid bruit; no JVD; Breasts:: Not examined CHEST WALL: No tenderness CHEST: Normal respiration, clear to auscultation bilaterally HEART: Regular  rate and rhythm; no murmurs rubs or gallops BACK: No kyphosis or scoliosis; no CVA tenderness ABDOMEN: Positive Bowel Sounds, soft non-tender; no masses, no organomegaly, +intertriginous candida. GU:  +Right Labial Abscess Rectal Exam: Not done EXTREMITIES: No cyanosis, clubbing or edema; no ulcerations. Genitalia: not examined PULSES: 2+ and symmetric SKIN: Normal hydration no rash or ulceration CNS: Cranial nerves 2-12 grossly intact no focal neurologic deficit   Labs & Imaging Results for  orders placed during the hospital encounter of 07/31/12 (from the past 48 hour(s))  CBC WITH DIFFERENTIAL     Status: Abnormal   Collection Time    07/31/12 12:30 AM      Result Value Range   WBC 10.4  4.0 - 10.5 K/uL   RBC 3.26 (*) 3.87 - 5.11 MIL/uL   Hemoglobin 10.1 (*) 12.0 - 15.0 g/dL   HCT 28.9 (*) 36.0 - 46.0 %   MCV 88.7  78.0 - 100.0 fL   MCH 31.0  26.0 - 34.0 pg   MCHC 34.9  30.0 - 36.0 g/dL   RDW 12.7  11.5 - 15.5 %   Platelets 155  150 - 400 K/uL   Neutrophils Relative % 67  43 - 77 %   Neutro Abs 7.0  1.7 - 7.7 K/uL   Lymphocytes Relative 25  12 - 46 %   Lymphs Abs 2.6  0.7 - 4.0 K/uL   Monocytes Relative 7  3 - 12 %   Monocytes Absolute 0.8  0.1 - 1.0 K/uL   Eosinophils Relative 1  0 - 5 %   Eosinophils Absolute 0.1  0.0 - 0.7 K/uL   Basophils Relative 0  0 - 1 %   Basophils Absolute 0.0  0.0 - 0.1 K/uL  COMPREHENSIVE METABOLIC PANEL     Status: Abnormal   Collection Time    07/31/12 12:30 AM      Result Value Range   Sodium 127 (*) 135 - 145 mEq/L   Potassium 4.0  3.5 - 5.1 mEq/L   Chloride 95 (*) 96 - 112 mEq/L   CO2 21  19 - 32 mEq/L   Glucose, Bld 590 (*) 70 - 99 mg/dL   Comment: CRITICAL RESULT CALLED TO, READ BACK BY AND VERIFIED WITH:     Ester Rink RN [phone] 0129 EBANKS COLCLOUGH, S   BUN 12  6 - 23 mg/dL   Creatinine, Ser 0.56  0.50 - 1.10 mg/dL   Calcium 8.8  8.4 - 10.5 mg/dL   Total Protein 6.8  6.0 - 8.3 g/dL   Albumin 3.3 (*) 3.5 - 5.2 g/dL   AST 34  0 - 37 U/L   ALT 37 (*) 0 - 35 U/L   Alkaline Phosphatase 86  39 - 117 U/L   Total Bilirubin 0.2 (*) 0.3 - 1.2 mg/dL   GFR calc non Af Amer >90  >90 mL/min   GFR calc Af Amer >90  >90 mL/min   Comment:            The eGFR has been calculated     using the CKD EPI equation.     This calculation has not been     validated in all clinical     situations.     eGFR's persistently     <90 mL/min signify     possible Chronic Kidney Disease.  GLUCOSE, CAPILLARY     Status: Abnormal   Collection  Time    07/31/12  3:13 AM  Result Value Range   Glucose-Capillary 377 (*) 70 - 99 mg/dL  GLUCOSE, CAPILLARY     Status: Abnormal   Collection Time    07/31/12  4:35 AM      Result Value Range   Glucose-Capillary 257 (*) 70 - 99 mg/dL  GLUCOSE, CAPILLARY     Status: Abnormal   Collection Time    07/31/12  6:02 AM      Result Value Range   Glucose-Capillary 246 (*) 70 - 99 mg/dL  CBC     Status: Abnormal   Collection Time    07/31/12  8:40 AM      Result Value Range   WBC 7.9  4.0 - 10.5 K/uL   RBC 2.75 (*) 3.87 - 5.11 MIL/uL   Hemoglobin 8.5 (*) 12.0 - 15.0 g/dL   HCT 24.5 (*) 36.0 - 46.0 %   MCV 89.1  78.0 - 100.0 fL   MCH 30.9  26.0 - 34.0 pg   MCHC 34.7  30.0 - 36.0 g/dL   RDW 12.7  11.5 - 15.5 %   Platelets 132 (*) 150 - 400 K/uL  GLUCOSE, CAPILLARY     Status: Abnormal   Collection Time    07/31/12  8:46 AM      Result Value Range   Glucose-Capillary 275 (*) 70 - 99 mg/dL       Assessment/Plan Active Problems:   Hyperglycemia   Candidal skin infection   Cellulitis and abscess   Anemia    HTN   1.   Hyperglycemia-Nonketotic HyperOsmolar,  IV Insulin drip and IVFs, Monitor electrolytes.    2.   Uncontrolled DM-2-  Transition to Lantus and SSI coverage, Needs Diabetes Education.     3.   Cellulitis/Abscess of right Labia (Vagina)- IV clindamycin.     4.    Candidal Skin infection of groin and intertriginous areas-   Diflucan PO q day X 5 days.     5.    Hypertension-   Monitor BPs  6.     Anemia-  Check Anemia Panel.     7.    Noncompliance-  Needs Diabetes Education.       Code Status:     FULL CODE Family Communication:     No Family at Bedside Disposition Plan:     Return to Home   Time spent: [facility] Pager [phone]  If 7PM-7AM, please contact night-coverage [email or web address] Password [facility] 07/31/2012, 9:29 AM

## 2012-07-31 NOTE — Progress Notes (Signed)
Initiating insulin drip per order. CBG checked prior to initiating; CBG 275.  Dr. Broadus John notified; orders given to d/c drip and start sliding scale. Will monitor. Cindee Salt

## 2012-07-31 NOTE — ED Notes (Signed)
BS 242

## 2012-07-31 NOTE — Plan of Care (Signed)
Problem: Consults Goal: Diagnosis-Diabetes Mellitus Outcome: Completed/Met Date Met:  07/31/12 Hyperglycemia

## 2012-07-31 NOTE — ED Notes (Signed)
Pt reports noticing a bump in the right groin area this past Saturday. States that bump has gotten larger and more painful since then. Has a history of axillary abscesses but never in the groin area. Rates pain 10/10 at this time. Tried to apply warm compresses to the area to draw out the pus but with no success.

## 2012-07-31 NOTE — Plan of Care (Signed)
Problem: Food- and Nutrition-Related Knowledge Deficit (NB-1.1) Goal: Nutrition education Formal process to instruct or train a patient/client in a skill or to impart knowledge to help patients/clients voluntarily manage or modify food choices and eating behavior to maintain or improve health. Outcome: Completed/Met Date Met:  07/31/12  RD consulted for nutrition education regarding diabetes.     Lab Results  Component Value Date    HGBA1C 8.0* 10/04/2010    RD provided "Carbohydrate Counting for People with Diabetes" handout from the Academy of [employer] and Dietetics. Discussed different food groups and their effects on blood sugar, emphasizing carbohydrate-containing foods. Provided list of carbohydrates and recommended serving sizes of common foods.  Discussed importance of controlled and consistent carbohydrate intake throughout the day. Provided examples of ways to balance meals/snacks and encouraged intake of high-fiber, whole grain complex carbohydrates. Teach back method used.  Expect fair compliance.  Body mass index is 29.27 kg/(m^2). Pt meets criteria for Overweight based on current BMI.  Current diet order is Carbohydrate Modified Medium Calorie, patient is consuming approximately 100% of meals at this time. Labs and medications reviewed.   No further nutrition interventions warranted at this time.  If additional nutrition issues arise, please re-consult RD.  Arthur Holms, RD, LDN Pager #: [phone] After-Hours Pager #: [phone]

## 2012-07-31 NOTE — ED Notes (Signed)
CBG 463 

## 2012-07-31 NOTE — ED Notes (Signed)
CBG=377

## 2012-07-31 NOTE — ED Provider Notes (Signed)
History     CSN: [id number]  Arrival date & time 07/30/12  2355   First MD Initiated Contact with Patient 07/31/12 0140      Chief Complaint  Patient presents with  . Abscess    (Consider location/radiation/quality/duration/timing/severity/associated sxs/prior treatment) HPI Comments: Gina [name] is a 52 year old female with non-insulin-dependent diabetes, who does not check her blood sugar reporting a abscess on her right labia.  For several, days.  Is not draining.  It is painful.  She also reports a rash, in her groin for several weeks  Patient is a 52 y.o. female presenting with abscess. The history is provided by the patient.  Abscess Location:  Ano-genital Ano-genital abscess location:  Vulva Abscess quality: painful and warmth   Abscess quality: not draining, no fluctuance, no induration, no itching and not weeping   Red streaking: no   Duration:  3 days Pain details:    Quality:  Aching Chronicity:  New Context: diabetes   Context: not skin injury   Relieved by:  None tried Worsened by:  Nothing tried Ineffective treatments:  None tried Associated symptoms: no fever and no nausea     Past Medical History  Diagnosis Date  . Hypertension   . Diabetes mellitus     Past Surgical History  Procedure Laterality Date  . Tubal ligation      No family history on file.  History  Substance Use Topics  . Smoking status: Never Smoker   . Smokeless tobacco: Not on file  . Alcohol Use: Yes    OB History   Grav Para Term Preterm Abortions TAB SAB Ect Mult Living                  Review of Systems  Constitutional: Negative for fever and chills.  Gastrointestinal: Negative for nausea.  Endocrine: Negative for polydipsia, polyphagia and polyuria.  Genitourinary: Positive for genital sores. Negative for dysuria, vaginal bleeding, vaginal discharge and vaginal pain.  Skin: Negative for wound.  All other systems reviewed and are negative.    Allergies  Review  of patient's allergies indicates no known allergies.  Home Medications   Current Outpatient Rx  Name  Route  Sig  Dispense  Refill  . lisinopril (PRINIVIL,ZESTRIL) 20 MG tablet   Oral   Take 1 tablet (20 mg total) by mouth daily.   30 tablet   0   . metFORMIN (GLUCOPHAGE) 500 MG tablet   Oral   Take 1 tablet (500 mg total) by mouth 2 (two) times daily with a meal.   60 tablet   0     BP 121/60  Temp(Src) 98.4 F (36.9 C) (Oral)  Resp 20  SpO2 99%  Physical Exam  Vitals reviewed. Cardiovascular: Regular rhythm.   Pulmonary/Chest: Effort normal and breath sounds normal.  Abdominal: Soft. Bowel sounds are normal.  Genitourinary:    Pelvic exam was performed with patient supine. There is tenderness on the right labia. There is no lesion on the right labia.  Groin creases filled with a waxy, fungal tinea-type rash  Musculoskeletal: Normal range of motion.  Neurological: She is alert.  Skin: Skin is warm. Rash noted.    ED Course  Procedures (including critical care time)  Labs Reviewed  CBC WITH DIFFERENTIAL - Abnormal; Notable for the following:    RBC 3.26 (*)    Hemoglobin 10.1 (*)    HCT 28.9 (*)    All other components within normal limits  COMPREHENSIVE METABOLIC PANEL -  Abnormal; Notable for the following:    Sodium 127 (*)    Chloride 95 (*)    Glucose, Bld 590 (*)    Albumin 3.3 (*)    ALT 37 (*)    Total Bilirubin 0.2 (*)    All other components within normal limits   No results found.   No diagnosis found.    MDM   Will start IV fluid and glucomander  Started IV antibiotics as well as PO Diflucan will admit       Garald Balding, NP 07/31/12 0518

## 2012-08-01 DIAGNOSIS — R7309 Other abnormal glucose: Secondary | ICD-10-CM

## 2012-08-01 DIAGNOSIS — L0291 Cutaneous abscess, unspecified: Secondary | ICD-10-CM

## 2012-08-01 DIAGNOSIS — B372 Candidiasis of skin and nail: Secondary | ICD-10-CM

## 2012-08-01 DIAGNOSIS — D649 Anemia, unspecified: Secondary | ICD-10-CM

## 2012-08-01 LAB — CBC
HCT: 26.4 % — ABNORMAL LOW (ref 36.0–46.0)
Hemoglobin: 9.1 g/dL — ABNORMAL LOW (ref 12.0–15.0)
MCH: 31 pg (ref 26.0–34.0)
MCHC: 34.5 g/dL (ref 30.0–36.0)
MCV: 89.8 fL (ref 78.0–100.0)

## 2012-08-01 LAB — BASIC METABOLIC PANEL
BUN: 6 mg/dL (ref 6–23)
Calcium: 8.1 mg/dL — ABNORMAL LOW (ref 8.4–10.5)
Chloride: 98 mEq/L (ref 96–112)
GFR calc non Af Amer: >90 mL/min (ref >90)
Glucose, Bld: 329 mg/dL — ABNORMAL HIGH (ref 70–99)
Potassium: 4 mEq/L (ref 3.5–5.1)

## 2012-08-01 LAB — GLUCOSE, CAPILLARY
Glucose-Capillary: 117 mg/dL — ABNORMAL HIGH (ref 70–99)
Glucose-Capillary: 206 mg/dL — ABNORMAL HIGH (ref 70–99)

## 2012-08-01 MED ORDER — BD GETTING STARTED TAKE HOME KIT: 3/10ML X 30G SYRINGES
1.0000 | Freq: Once | Status: AC
  Administered 2012-08-01: 1
  Filled 2012-08-01: qty 1

## 2012-08-01 MED ORDER — INSULIN ASPART 100 UNIT/ML ~~LOC~~ SOLN
3.0000 [IU] | Freq: Three times a day (TID) | SUBCUTANEOUS | Status: DC
  Administered 2012-08-01 – 2012-08-03 (×6): 3 [IU] via SUBCUTANEOUS

## 2012-08-01 MED ORDER — INSULIN DETEMIR 100 UNIT/ML ~~LOC~~ SOLN
15.0000 [IU] | Freq: Every day | SUBCUTANEOUS | Status: DC
  Administered 2012-08-01 – 2012-08-03 (×3): 15 [IU] via SUBCUTANEOUS
  Filled 2012-08-01 (×3): qty 0.15

## 2012-08-01 NOTE — Progress Notes (Signed)
"  BD Getting Started" insulin teaching kit provided to patient. Instructed patient in drawing up insulin; able to return demonstrate fairly. Instructed patient on insulin injection. She did very well with self injection. Patient reading materials given. Patient is eager to learn. With time and practice, I think she will do okay. Cindee Salt

## 2012-08-01 NOTE — Progress Notes (Signed)
Patient very anxious about administering self insulin shots. Attempted to allow patient to return demonstrate insulin administration but patient closed her eyes and shook her head no. Patient has seen several videos regarding diabetes education but will need consistent teaching d/t lack of understanding. Will continue to monitor.Cindee Salt

## 2012-08-01 NOTE — Progress Notes (Signed)
[facility] PROGRESS NOTE  Gina [name] [id number] DOB: 15-Nov-1960 DOA: 07/31/2012 PCP: Default, Provider, MD  Assessment/Plan: 1. Labial Cellulitis -continue IV clindamycin, warm compresses -continue fluconazole -DM control -if evolves into abscess will need I&D  2. DM: uncontrolled -off glucomander early am yesterday -add levemir today, increase dose -Novolog with meals, SSI -DM education, CM consult for medication assistance  3. HTN: -stable,   4. Anemia -check anemia panel  DVt proph:  SCDs   Code Status: FULL Family Communication: None at bedside Disposition Plan: home soon   Consultants:  DM coordinator  HPI/Subjective: Feels better, worried about insulin  Objective: Filed Vitals:   07/31/12 0702 07/31/12 1350 07/31/12 2009 08/01/12 0325  BP: 106/56 110/66 110/71 107/60  Pulse: 80 77 75 84  Temp: 98.4 F (36.9 C) 98.2 F (36.8 C) 98.6 F (37 C) 99.1 F (37.3 C)  TempSrc: Oral Oral Oral Oral  Resp: 20 16 18 18   Height:      Weight: 74.934 kg (165 lb 3.2 oz)   75.796 kg (167 lb 1.6 oz)  SpO2: 99% 99% 98% 98%    Intake/Output Summary (Last 24 hours) at 08/01/12 1243 Last data filed at 08/01/12 0900  Gross per 24 hour  Intake    600 ml  Output      0 ml  Net    600 ml   Filed Weights   07/31/12 0320 07/31/12 0702 08/01/12 0325  Weight: 74.39 kg (164 lb) 74.934 kg (165 lb 3.2 oz) 75.796 kg (167 lb 1.6 oz)    Exam:   General:  AAOx3  Cardiovascular: S1S2/RRR  Respiratory: CTAB  Abdomen: soft, NT, BS present  Ext: no edema c/c  Perineum: R labia with erythema edema and tenderness, area of induration improved, no fluctuation   Data Reviewed: Basic Metabolic Panel:  Recent Labs Lab 07/31/12 0030 07/31/12 0840 08/01/12 0838  NA 127* 136 130*  K 4.0 3.8 4.0  CL 95* 105 98  CO2 21 22 22   GLUCOSE 590* 309* 329*  BUN 12 8 6   CREATININE 0.56 0.49* 0.53  CALCIUM 8.8 7.7* 8.1*   Liver Function Tests:  Recent  Labs Lab 07/31/12 0030  AST 34  ALT 37*  ALKPHOS 86  BILITOT 0.2*  PROT 6.8  ALBUMIN 3.3*   No results found for this basename: LIPASE, AMYLASE,  in the last 168 hours No results found for this basename: AMMONIA,  in the last 168 hours CBC:  Recent Labs Lab 07/31/12 0030 07/31/12 0840 08/01/12 0838  WBC 10.4 7.9 8.7  NEUTROABS 7.0  --   --   HGB 10.1* 8.5* 9.1*  HCT 28.9* 24.5* 26.4*  MCV 88.7 89.1 89.8  PLT 155 132* 147*   Cardiac Enzymes: No results found for this basename: CKTOTAL, CKMB, CKMBINDEX, TROPONINI,  in the last 168 hours BNP (last 3 results) No results found for this basename: PROBNP,  in the last 8760 hours CBG:  Recent Labs Lab 07/31/12 1126 07/31/12 1606 07/31/12 2106 08/01/12 0606 08/01/12 1118  GLUCAP 369* 153* 124* 117* 306*    No results found for this or any previous visit (from the past 240 hour(s)).   Studies: No results found.  Scheduled Meds: . clindamycin (CLEOCIN) IV  600 mg Intravenous Q8H  . fluconazole  150 mg Oral Daily  . insulin aspart  0-15 Units Subcutaneous TID WC  . insulin aspart  3 Units Subcutaneous TID WC  . insulin detemir  15 Units Subcutaneous Daily  . sodium  chloride  3 mL Intravenous Q12H   Continuous Infusions: . sodium chloride 75 mL/hr at 08/01/12 1053    Active Problems:   Hyperglycemia   Candidal skin infection   Cellulitis and abscess   Anemia    Time spent: 1min    Gina [name]  [facility] Pager [phone]. If 7PM-7AM, please contact night-coverage at [email or web address], password [facility] 08/01/2012, 12:43 PM  LOS: 1 day

## 2012-08-01 NOTE — Progress Notes (Signed)
Inpatient Diabetes Program Recommendations  AACE/ADA: New Consensus Statement on Inpatient Glycemic Control (2013)  Target Ranges:  Prepandial:   less than 140 mg/dL      Peak postprandial:   less than 180 mg/dL (1-2 hours)      Critically ill patients:  140 - 180 mg/dL   Reason for Visit: Diabetes Coordinator spoke with patient concerning A1C=13.3 and the need for insulin.  Patient is accepting of learning how to self administer insulin.  She will view the videos 506 & 508 on the patient ed channel about giving insulin.  Bedside RN will begin education at scheduled doses.  An insulin starter kit has been ordered. Patient needs a glucose meter because hers does not work anymore.  Information was given to patient about the [employer] ReliOn meter that is approximately $16 and the strips $9.   Will follow. Thank you  Raoul Pitch BSN, RN,CDE Inpatient Diabetes Coordinator [phone] (team pager)

## 2012-08-02 LAB — GLUCOSE, CAPILLARY
Glucose-Capillary: 123 mg/dL — ABNORMAL HIGH (ref 70–99)
Glucose-Capillary: 83 mg/dL (ref 70–99)
Glucose-Capillary: 128 mg/dL — ABNORMAL HIGH (ref 70–99)
Glucose-Capillary: 148 mg/dL — ABNORMAL HIGH (ref 70–99)
Glucose-Capillary: 159 mg/dL — ABNORMAL HIGH (ref 70–99)

## 2012-08-02 LAB — FOLATE: Folate: 16 ng/mL

## 2012-08-02 LAB — IRON AND TIBC
Iron: 35 ug/dL — ABNORMAL LOW (ref 42–135)
Saturation Ratios: 11 % — ABNORMAL LOW (ref 20–55)
TIBC: 329 ug/dL (ref 250–470)
UIBC: 294 ug/dL (ref 125–400)

## 2012-08-02 NOTE — Progress Notes (Signed)
Patient c/o of some discomfort in vaginal area. Warm compress provided. Cindee Salt

## 2012-08-02 NOTE — Progress Notes (Addendum)
[facility] PROGRESS NOTE  Gina [name] [id number] DOB: 1960-05-06 DOA: 07/31/2012 PCP: Default, Provider, MD  Assessment/Plan: 1. Labial Cellulitis -continue IV clindamycin Day 3, change to Po tomorrow - warm compresses -continue fluconazole -DM control -Fu with GYn in 1 week, called [name] for FU, left referral  2. DM: uncontrolled -off glucomander early am yesterday -continue levemir today, increase dose -Novolog with meals, SSI -DM education, CM consult for medication assistance  3. HTN: -stable,   4. Anemia -FU anemia panel  DVt proph:  SCDs   Code Status: FULL Family Communication: None at bedside Disposition Plan: home soon   Consultants:  DM coordinator  HPI/Subjective: Feels better, worried about taking insulin at home  Objective: Filed Vitals:   08/01/12 1426 08/01/12 2027 08/02/12 0503 08/02/12 1000  BP: 112/72 113/76 151/82 125/74  Pulse: 80 82 99 82  Temp: 98.9 F (37.2 C) 98.7 F (37.1 C)  98.8 F (37.1 C)  TempSrc: Oral Oral Oral Oral  Resp: 18 18 18    Height:      Weight:      SpO2: 98% 98% 99% 100%    Intake/Output Summary (Last 24 hours) at 08/02/12 1216 Last data filed at 08/02/12 0900  Gross per 24 hour  Intake 4127.5 ml  Output   1000 ml  Net 3127.5 ml   Filed Weights   07/31/12 0320 07/31/12 0702 08/01/12 0325  Weight: 74.39 kg (164 lb) 74.934 kg (165 lb 3.2 oz) 75.796 kg (167 lb 1.6 oz)    Exam:   General:  AAOx3  Cardiovascular: S1S2/RRR  Respiratory: CTAB  Abdomen: soft, NT, BS present  Ext: no edema c/c  Perineum: R labia with erythema edema and tenderness, area of induration improved, no fluctuation   Data Reviewed: Basic Metabolic Panel:  Recent Labs Lab 07/31/12 0030 07/31/12 0840 08/01/12 0838  NA 127* 136 130*  K 4.0 3.8 4.0  CL 95* 105 98  CO2 21 22 22   GLUCOSE 590* 309* 329*  BUN 12 8 6   CREATININE 0.56 0.49* 0.53  CALCIUM 8.8 7.7* 8.1*   Liver Function  Tests:  Recent Labs Lab 07/31/12 0030  AST 34  ALT 37*  ALKPHOS 86  BILITOT 0.2*  PROT 6.8  ALBUMIN 3.3*   No results found for this basename: LIPASE, AMYLASE,  in the last 168 hours No results found for this basename: AMMONIA,  in the last 168 hours CBC:  Recent Labs Lab 07/31/12 0030 07/31/12 0840 08/01/12 0838  WBC 10.4 7.9 8.7  NEUTROABS 7.0  --   --   HGB 10.1* 8.5* 9.1*  HCT 28.9* 24.5* 26.4*  MCV 88.7 89.1 89.8  PLT 155 132* 147*   Cardiac Enzymes: No results found for this basename: CKTOTAL, CKMB, CKMBINDEX, TROPONINI,  in the last 168 hours BNP (last 3 results) No results found for this basename: PROBNP,  in the last 8760 hours CBG:  Recent Labs Lab 08/01/12 1118 08/01/12 1621 08/01/12 2107 08/02/12 0644 08/02/12 1113  GLUCAP 306* 206* 123* 83 128*    No results found for this or any previous visit (from the past 240 hour(s)).   Studies: No results found.  Scheduled Meds: . clindamycin (CLEOCIN) IV  600 mg Intravenous Q8H  . fluconazole  150 mg Oral Daily  . insulin aspart  0-15 Units Subcutaneous TID WC  . insulin aspart  3 Units Subcutaneous TID WC  . insulin detemir  15 Units Subcutaneous Daily  . sodium chloride  3 mL Intravenous Q12H  Continuous Infusions: . sodium chloride 75 mL/hr at 08/02/12 [id number]    Active Problems:   Hyperglycemia   Candidal skin infection   Cellulitis and abscess   Anemia    Time spent: 55min    Gina [name]  [facility] Pager [phone]. If 7PM-7AM, please contact night-coverage at [email or web address], password [facility] 08/02/2012, 12:16 PM  LOS: 2 days

## 2012-08-03 DIAGNOSIS — E119 Type 2 diabetes mellitus without complications: Secondary | ICD-10-CM | POA: Diagnosis present

## 2012-08-03 DIAGNOSIS — N739 Female pelvic inflammatory disease, unspecified: Secondary | ICD-10-CM | POA: Diagnosis present

## 2012-08-03 MED ORDER — LISINOPRIL 20 MG PO TABS: 20.0000 mg | ORAL_TABLET | Freq: Every day | ORAL | Status: DC

## 2012-08-03 MED ORDER — INSULIN DETEMIR 100 UNIT/ML ~~LOC~~ SOLN: 15.0000 [IU] | Freq: Every day | SUBCUTANEOUS | Status: DC

## 2012-08-03 MED ORDER — DOXYCYCLINE HYCLATE 100 MG PO TABS: 100.0000 mg | ORAL_TABLET | Freq: Two times a day (BID) | ORAL | Status: DC

## 2012-08-03 MED ORDER — UNABLE TO FIND: Status: DC

## 2012-08-03 MED ORDER — METFORMIN HCL 500 MG PO TABS: 500.0000 mg | ORAL_TABLET | Freq: Two times a day (BID) | ORAL | Status: DC

## 2012-08-03 MED ORDER — OXYCODONE HCL 5 MG PO TABS: 5.0000 mg | ORAL_TABLET | Freq: Four times a day (QID) | ORAL | Status: DC | PRN

## 2012-08-03 NOTE — Progress Notes (Signed)
Case Management notified of Pt's discharge per MD request. Case Management states pt has everything she needs and is clear to be discharged. Will continue to monitor.

## 2012-08-03 NOTE — Progress Notes (Signed)
Pt discharged per MD order and protocol. Discharge instructions reviewed with patient and all questions answered. Pt given all prescriptions. Pt aware of all follow up appointments.

## 2012-08-06 NOTE — Discharge Summary (Signed)
Physician Discharge Summary  Gina [name] [id number] DOB: Apr 04, 1960 DOA: 07/31/2012  PCP: Default, Provider, MD  Admit date: 07/31/2012 Discharge date: 08/03/2012  Time spent: 50 minutes  Recommendations for Outpatient Follow-up:  1. [facility] 6/5 2. [facility] Outpatient clinic in 1 week, referral sent  Discharge Diagnoses:  Active Problems:   Hyperglycemia   Candidal skin infection   Anemia-Iron deficiency   Cellulitis of female genitalia   Type II DM, uncontrolled   Discharge Condition: stable  Diet recommendation: Diabetic  Filed Weights   07/31/12 0702 08/01/12 0325 08/03/12 0553  Weight: 74.934 kg (165 lb 3.2 oz) 75.796 kg (167 lb 1.6 oz) 74.662 kg (164 lb 9.6 oz)    History of present illness:  Gina [name] is a 52 y.o. female with A history of DM2 and poor compliance with medical therapy who presents with complaints of a boil down there on her private area. She reports having increased pain and could not walk due to the pain. She reports that the boils has been draining yellowish fluid. She denies having fevers or chills. She was found to have a glucose level of 600 in the ED but was not in DKA, she reports taking her diabetes medication sometimes, and she does not check her blood sugars. In the ED, she was placed on the IV Insulin drip (Glucose Stabilizer) and given IV Clindamycin ad PO diflucan and referred for admission  Hospital Course:  1.  Labial Cellulitis -No drainable abscess at this time -initially treated with IV clindamycin Day for 3 days then change to Po doxycycline and fluconazole  - warm compresses used as well -DM control  -Fu with GYn in 1 week, called [facility] for FU, left referral   2. DM: uncontrolled  -off glucomander by the next day  -Hbaic 13 -Seen by DM educator -started on levemir, -Filled out Patient assistance for Levemir and given 40days supply via the match program -If the  Patient assistnce program is not approved she will need to be switched to Insulin 70/30   -DM education completed -CM consulted for medication assistance and match program  3. HTN:  -stable,   4. Anemia  -Anemia panel consistent with Iron deficiency, most likely from menstrual blood loss. -will need to start PO Iron once off antibiotics due to nausea, dyspepsia   Discharge Exam: Filed Vitals:   08/02/12 1000 08/02/12 1340 08/02/12 2044 08/03/12 0553  BP: 125/74 120/73 110/73 112/71  Pulse: 82 92 80 76  Temp: 98.8 F (37.1 C) 97.3 F (36.3 C) 98.1 F (36.7 C) 98.2 F (36.8 C)  TempSrc: Oral Oral Oral Oral  Resp:   18 20  Height:      Weight:    74.662 kg (164 lb 9.6 oz)  SpO2: 100% 98% 99% 100%    General: AAOx3 Cardiovascular: S1S2/RRR Respiratory: CTAB  Discharge Instructions  Discharge Orders   Future Appointments Provider Department Dept Phone   08/15/2012 10:00 AM [facility] Cover[facility] Provider [place] [phone]   Future Orders Complete By Expires     Diet Carb Modified  As directed     Increase activity slowly  As directed         Medication List    TAKE these medications       doxycycline 100 MG tablet  Commonly known as:  VIBRA-TABS  Take 1 tablet (100 mg total) by mouth 2 (two) times daily after a meal. For 10 days  insulin detemir 100 UNIT/ML injection  Commonly known as:  LEVEMIR  Inject 0.15 mLs (15 Units total) into the skin daily.     lisinopril 20 MG tablet  Commonly known as:  PRINIVIL,ZESTRIL  Take 1 tablet (20 mg total) by mouth daily.     metFORMIN 500 MG tablet  Commonly known as:  GLUCOPHAGE  Take 1 tablet (500 mg total) by mouth 2 (two) times daily with a meal.     oxyCODONE 5 MG immediate release tablet  Commonly known as:  Oxy IR/ROXICODONE  Take 1 tablet (5 mg total) by mouth every 6 (six) hours as needed for pain.     UNABLE TO FIND  This note is to excuse Ms.Baxley from work 5/21 to 5/26  due to medical illness requiring hospitalization       No Known Allergies     Follow-up Information   Follow up with [facility]     On 08/15/2012. (10:00am   please bring all medications that you are taking with you.  $20 copay.)    Contact information:   [place] [address]  [id number]       Follow up with [facility]. Schedule an appointment as soon as possible for a visit in 1 week. (call on Tuesday for appointment)    Contact information:   [place] [address]        The results of significant diagnostics from this hospitalization (including imaging, microbiology, ancillary and laboratory) are listed below for reference.    Significant Diagnostic Studies: No results found.  Microbiology: No results found for this or any previous visit (from the past 240 hour(s)).   Labs: Basic Metabolic Panel:  Recent Labs Lab 07/31/12 0030 07/31/12 0840 08/01/12 0838  NA 127* 136 130*  K 4.0 3.8 4.0  CL 95* 105 98  CO2 21 22 22   GLUCOSE 590* 309* 329*  BUN 12 8 6   CREATININE 0.56 0.49* 0.53  CALCIUM 8.8 7.7* 8.1*   Liver Function Tests:  Recent Labs Lab 07/31/12 0030  AST 34  ALT 37*  ALKPHOS 86  BILITOT 0.2*  PROT 6.8  ALBUMIN 3.3*   No results found for this basename: LIPASE, AMYLASE,  in the last 168 hours No results found for this basename: AMMONIA,  in the last 168 hours CBC:  Recent Labs Lab 07/31/12 0030 07/31/12 0840 08/01/12 0838  WBC 10.4 7.9 8.7  NEUTROABS 7.0  --   --   HGB 10.1* 8.5* 9.1*  HCT 28.9* 24.5* 26.4*  MCV 88.7 89.1 89.8  PLT 155 132* 147*   Cardiac Enzymes: No results found for this basename: CKTOTAL, CKMB, CKMBINDEX, TROPONINI,  in the last 168 hours BNP: BNP (last 3 results) No results found for this basename: PROBNP,  in the last 8760 hours CBG:  Recent Labs Lab 08/02/12 0644 08/02/12 1113 08/02/12 1612 08/02/12 2143  08/03/12 0642  GLUCAP 83 128* 148* 159* 134*       Signed:  Mumin Denomme  [facility] 08/06/2012, 8:32 PM

## 2012-08-15 ENCOUNTER — Ambulatory Visit: Payer: Self-pay | Attending: Family Medicine | Admitting: Internal Medicine

## 2012-08-15 VITALS — BP 130/72 | HR 79 | Temp 98.4°F | Resp 17 | Wt 165.4 lb

## 2012-08-15 DIAGNOSIS — E1110 Type 2 diabetes mellitus with ketoacidosis without coma: Secondary | ICD-10-CM

## 2012-08-15 DIAGNOSIS — IMO0001 Reserved for inherently not codable concepts without codable children: Secondary | ICD-10-CM | POA: Insufficient documentation

## 2012-08-15 DIAGNOSIS — E1310 Other specified diabetes mellitus with ketoacidosis without coma: Secondary | ICD-10-CM

## 2012-08-15 DIAGNOSIS — N764 Abscess of vulva: Secondary | ICD-10-CM | POA: Insufficient documentation

## 2012-08-15 DIAGNOSIS — E785 Hyperlipidemia, unspecified: Secondary | ICD-10-CM | POA: Insufficient documentation

## 2012-08-15 MED ORDER — INSULIN DETEMIR 100 UNIT/ML ~~LOC~~ SOLN: 15.0000 [IU] | Freq: Every day | SUBCUTANEOUS | Status: DC

## 2012-08-15 NOTE — Progress Notes (Signed)
Patient ID: Gina [name], female   DOB: May 09, 1960, 52 y.o.   MRN: [id number]   CC: Followup  HPI: Patient is 52 year old female with history of hypertension and uncontrolled diabetes, comes in for followup on her recent hospitalization. She was hospitalized for labial abscess and has required antibiotic treatment. She was told her diabetes is uncontrolled and she needs to have a primary care doctor to followup. She comes in today explaining she has not filled her medicine and she did not have money for it. In addition she explains she has not been checking her sugar levels regularly. She has meter at home and supplies but says she did not have time to check her sugar levels. She denies chest pain or shortness of breath, she explains she has chronic fatigue nothing new. She denies any specific abdominal or urinary concerns.  No Known Allergies Past Medical History  Diagnosis Date  . Hypertension   . Diabetes mellitus    Current Outpatient Prescriptions on File Prior to Visit  Medication Sig Dispense Refill  . doxycycline (VIBRA-TABS) 100 MG tablet Take 1 tablet (100 mg total) by mouth 2 (two) times daily after a meal. For 10 days  20 tablet  0  . lisinopril (PRINIVIL,ZESTRIL) 20 MG tablet Take 1 tablet (20 mg total) by mouth daily.  30 tablet  0  . metFORMIN (GLUCOPHAGE) 500 MG tablet Take 1 tablet (500 mg total) by mouth 2 (two) times daily with a meal.  60 tablet  0  . oxyCODONE (OXY IR/ROXICODONE) 5 MG immediate release tablet Take 1 tablet (5 mg total) by mouth every 6 (six) hours as needed for pain.  20 tablet  0  . UNABLE TO FIND This note is to excuse Ms.Crossland from work 5/21 to 5/26 due to medical illness requiring hospitalization  1 %  0   No current facility-administered medications on file prior to visit.   Family History  Problem Relation Age of Onset  . Diabetes type II Father   . Diabetes Mellitus II Brother   . CAD Father   . Prostate cancer Father    History   Social  History  . Marital Status: Single    Spouse Name: N/A    Number of Children: N/A  . Years of Education: N/A   Occupational History  . Not on file.   Social History Main Topics  . Smoking status: Never Smoker   . Smokeless tobacco: Not on file  . Alcohol Use: Yes  . Drug Use: No  . Sexually Active: Not on file   Other Topics Concern  . Not on file   Social History Narrative  . No narrative on file    Review of Systems  Constitutional: Negative for fever, chills, diaphoresis, activity change, appetite change and fatigue.  HENT: Negative for ear pain, nosebleeds, congestion, facial swelling, rhinorrhea, neck pain, neck stiffness and ear discharge.   Eyes: Negative for pain, discharge, redness, itching and visual disturbance.  Respiratory: Negative for cough, choking, chest tightness, shortness of breath, wheezing and stridor.   Cardiovascular: Negative for chest pain, palpitations and leg swelling.  Gastrointestinal: Negative for abdominal distention.  Genitourinary: Negative for dysuria, urgency, frequency, hematuria, flank pain, decreased urine volume, difficulty urinating and dyspareunia.  Musculoskeletal: Negative for back pain, joint swelling, arthralgias and gait problem.  Neurological: Negative for dizziness, tremors, seizures, syncope, facial asymmetry, speech difficulty, weakness, light-headedness, numbness and headaches.  Hematological: Negative for adenopathy. Does not bruise/bleed easily.  Psychiatric/Behavioral: Negative for hallucinations, behavioral  problems, confusion, dysphoric mood, decreased concentration and agitation.    Objective:   Filed Vitals:   08/15/12 1006  BP: 130/72  Pulse: 79  Temp: 98.4 F (36.9 C)  Resp: 17    Physical Exam  Constitutional: Appears well-developed and well-nourished. No distress.  patient appears chronically ill and tired HENT: Normocephalic. External right and left ear normal. Very dry mucous membranes CVS: RRR, S1/S2  +, no murmurs, no gallops, no carotid bruit.  Pulmonary: Effort and breath sounds normal, no stridor, rhonchi, wheezes, rales.  Abdominal: Soft. BS +,  no distension, tenderness, rebound or guarding.  Psychiatric: Normal mood and affect. Behavior, judgment, thought content normal.   Lab Results  Component Value Date   WBC 8.7 08/01/2012   HGB 9.1* 08/01/2012   HCT 26.4* 08/01/2012   MCV 89.8 08/01/2012   PLT 147* 08/01/2012   Lab Results  Component Value Date   CREATININE 0.53 08/01/2012   BUN 6 08/01/2012   NA 130* 08/01/2012   K 4.0 08/01/2012   CL 98 08/01/2012   CO2 22 08/01/2012    Lab Results  Component Value Date   HGBA1C 13.3* 07/31/2012   Lipid Panel     Component Value Date/Time   CHOL 194 10/04/2010 0643   TRIG 42 10/04/2010 0643   HDL 80 10/04/2010 0643   CHOLHDL 2.4 10/04/2010 0643   VLDL 8 10/04/2010 0643   LDLCALC 106* 10/04/2010 0643       Assessment and plan:   Patient Active Problem List   Diagnosis Date Noted  . Cellulitis of female genitalia - patient explains this has now significantly improved, she is still taking doxycycline.  08/03/2012  . Type II or unspecified type diabetes mellitus without mention of complication, uncontrolled - patient was confused about medication she was started on Levemir. She was told that this will be given to her in outpatient clinic but patient actually needs to go to the hospital outpatient pharmacy with prescription and a letter from match program to have this medication filled. We do not have living here in the clinic available, patient was approved for match program but explains she has never received any letter. I have advised her to go to outpatient pharmacy and asked them if this can be filled and perhaps they can pull out a note to verify she was approved by match program.  08/03/2012  .  hyperlipidemia - we have discussed target LDL and diabetic patients, patient's LDL is 106 last check in 2012. I advised patient to have lipid  panel checked again today that she is deferring this for next visit  07/31/2012

## 2012-08-15 NOTE — Patient Instructions (Signed)
Monitoring for Diabetes  There are two blood tests that help you monitor and manage your diabetes. These include:  · An A1c (hemoglobin A1c) test.  · This test is an average of your glucose (or blood sugar) control over the past 3 months.  · This is recommended as a way for you and your caregiver to understand how well your glucose levels are controlled on the average.  · Your A1c goal will be determined by your caregiver, but it is usually best if it is less than 6.5% to 7.0%.  · Glucose (sugar) attaches itself to red blood cells. The amount of glucose then can then be measured. The amount of glucose on the cells depends on how high your blood glucose has been.  · SMBG test (self-monitoring blood glucose).  · Using a blood glucose monitor (meter) to do SMBG testing is an easy way to monitor the amount of glucose in your blood and can help you improve your control. The monitor will tell you what your blood glucose is at that very moment. Every person with diabetes should have a blood glucose monitor and know how to use it. The better you control your blood sugar on a daily basis, the better your A1c levels will be.  HOW OFTEN SHOULD I HAVE AN A1C LEVEL?  · Every 3 months if your diabetes is not well controlled or if therapy has changed.  · Every 6 months if you are meeting your treatment goals.  HOW OFTEN SHOULD I DO SMBG TESTING?   Your caregiver will recommend how often you should test. Testing times are based on the kind of medicine you take, type of diabetes you have, and your blood glucose control. Testing times can include:  · Type 1 diabetes: test 3 or 4 times a day or as directed.  · Type 2 diabetes and if you are taking insulin and diabetes pills: test 3 or 4 times a day or as directed.  · If you are taking diabetes pills only and not reaching your target A1c: test 2 to 4 times a day or as directed.  · If you are taking diabetes pills and are controlling your diabetes well with diet and exercise, your  caregiver will help you decide what is appropriate.  WHAT TIME OF DAY SHOULD I TEST?   The best time of day to test your blood glucose depends on medications, mealtimes, exercise, and blood glucose control. It is best to test at different times because this will help you know how you are doing throughout the day. Your caregiver will help you decide what is best.  WHAT SHOULD MY BLOOD GLUCOSE BE?  Blood glucose target goals may vary depending on each persons needs, whether they have type 1 or type 2 diabetes or what medications they are taking. However, as a general rule, blood glucose should be:  · Before meals   70-130 mg/dl.  · After meals    ..less than 180 mg/dl.  CHECK YOUR BLOOD GLUCOSE IF:  · You have symptoms of low blood sugar (hypoglycemia), which may include dizziness, shaking, sweating, chills and confusion.  · You have symptoms of high blood sugar (hyperglycemia), which may include sleepiness, blurred vision, frequent urination and excessive thirst.  · You are learning how meals, physical activity and medicine affect your blood glucose level. The more you learn about how various foods, your medications, and activities affect you, the better job you will do of taking care of yourself.  ·   You have a job in which poor control could cause safety problems while driving or operating machinery.  CHECK YOUR BLOOD SUGAR MORE FREQUENTLY:  · If you have medication or dietary changes.  · If you begin taking other kinds of medicines.  · If you become sick or your level of stress increases. With an illness, your blood sugar may even be high without eating.  · Before and after exercise.  Follow your caregiver's testing recommendations during this time.   TO DISPOSE OF SHARPS:  Each city or state may have different regulations. Check with your public works or waste management department.  · Sharps containers can be purchased from pharmacies.  · Place all used sharps in a container. You do not need to replace any  protective covers over the needle or break the needle.  · Sharps should be contained in a ridge, leakproof, puncture-resistant container.  · Plastic detergent bottle.  · Bleach bottle.  · When container is almost full, add a solution that is 1 part laundry bleach and 9 parts tap water (it is okay to use undiluted bleach if you wish). You may want to wear gloves since bleach can damage tissue. Let the solution sit for 30 minutes.  · Carefully pour all the liquid into the sanitary sewer. Be sure to prevent the sharps from falling out.  · Once liquid is drained, reseal the container with lid and tape it shut with duct tape. This will prevent the cap from coming off.  · Dispose of the container with your regular household trash and waste. It is a good idea to let your trash hauler know that you will be disposing of sharps.  Document Released: 03/02/2003 Document Revised: 11/22/2011 Document Reviewed: 08/31/2008  [employer]® Patient Information ©2014 [employer].

## 2012-08-15 NOTE — Progress Notes (Signed)
Patient is hospital follow up History of DM and HTN Was seen in hospital for labial abcess

## 2012-09-17 ENCOUNTER — Ambulatory Visit: Payer: Self-pay

## 2013-03-12 ENCOUNTER — Emergency Department (HOSPITAL_COMMUNITY)
Admission: EM | Admit: 2013-03-12 | Discharge: 2013-03-12 | Disposition: A | Discharge status: Home / Self Care | Payer: Self-pay | Attending: Emergency Medicine | Admitting: Emergency Medicine

## 2013-03-12 ENCOUNTER — Encounter (HOSPITAL_COMMUNITY): Payer: Self-pay | Admitting: Emergency Medicine

## 2013-03-12 DIAGNOSIS — I10 Essential (primary) hypertension: Secondary | ICD-10-CM | POA: Insufficient documentation

## 2013-03-12 DIAGNOSIS — K089 Disorder of teeth and supporting structures, unspecified: Secondary | ICD-10-CM | POA: Insufficient documentation

## 2013-03-12 DIAGNOSIS — H109 Unspecified conjunctivitis: Secondary | ICD-10-CM | POA: Insufficient documentation

## 2013-03-12 DIAGNOSIS — Z79899 Other long term (current) drug therapy: Secondary | ICD-10-CM | POA: Insufficient documentation

## 2013-03-12 DIAGNOSIS — E119 Type 2 diabetes mellitus without complications: Secondary | ICD-10-CM | POA: Insufficient documentation

## 2013-03-12 LAB — GLUCOSE, CAPILLARY: Glucose-Capillary: 312 mg/dL — ABNORMAL HIGH (ref 70–99)

## 2013-03-12 LAB — POCT I-STAT, CHEM 8
Creatinine, Ser: 0.6 mg/dL (ref 0.50–1.10)
Glucose, Bld: 380 mg/dL — ABNORMAL HIGH (ref 70–99)
Hemoglobin: 13.6 g/dL (ref 12.0–15.0)
Sodium: 136 mEq/L — ABNORMAL LOW (ref 137–147)
TCO2: 25 mmol/L (ref 0–100)

## 2013-03-12 MED ORDER — METFORMIN HCL 500 MG PO TABS
1000.0000 mg | ORAL_TABLET | Freq: Once | ORAL | Status: AC
  Administered 2013-03-12: 1000 mg via ORAL
  Filled 2013-03-12: qty 2

## 2013-03-12 MED ORDER — METFORMIN HCL 1000 MG PO TABS: 1000.0000 mg | ORAL_TABLET | Freq: Once | ORAL | Status: DC

## 2013-03-12 MED ORDER — PENICILLIN V POTASSIUM 250 MG PO TABS
500.0000 mg | ORAL_TABLET | Freq: Once | ORAL | Status: AC
  Administered 2013-03-12: 500 mg via ORAL
  Filled 2013-03-12: qty 2

## 2013-03-12 MED ORDER — LISINOPRIL 20 MG PO TABS: 10.0000 mg | ORAL_TABLET | Freq: Every day | ORAL | Status: DC

## 2013-03-12 MED ORDER — PENICILLIN V POTASSIUM 500 MG PO TABS: 500.0000 mg | ORAL_TABLET | Freq: Three times a day (TID) | ORAL | Status: DC

## 2013-03-12 MED ORDER — ACETAMINOPHEN 500 MG PO TABS
1000.0000 mg | ORAL_TABLET | Freq: Once | ORAL | Status: AC
  Administered 2013-03-12: 1000 mg via ORAL
  Filled 2013-03-12: qty 2

## 2013-03-12 MED ORDER — TOBRAMYCIN 0.3 % OP SOLN
2.0000 [drp] | OPHTHALMIC | Status: DC
  Administered 2013-03-12: 2 [drp] via OPHTHALMIC
  Filled 2013-03-12: qty 5

## 2013-03-12 NOTE — ED Provider Notes (Signed)
MSE was initiated and I personally evaluated the patient and placed orders (if any) at  10:59 AM on March 12, 2013.  Patient is a 52 y.o. Female with a past medical history of HTN and DM presenting to the emergency department initially for an eye problem. She reports some associated pain to the upper, right gingiva that is exacerbated with eating/chewing. Patient is also complaining of fatigue, weakness, and dizziness, polyuria, polydipsia and has been out of her insulin for a couple of months. She states someone "stole her medications."   Patient will be moved to acute care side for further evaluation of hyperglycemia along with eye complaint.      The patient appears stable so that the remainder of the MSE may be completed by another provider.    Harlow Mares, PA-C 03/12/13 1145

## 2013-03-12 NOTE — ED Notes (Signed)
PA ordered pt moved to acute side for work-up of hyperglycemia.

## 2013-03-12 NOTE — ED Provider Notes (Signed)
Medical screening examination/treatment/procedure(s) were performed by non-physician practitioner and as supervising physician I was immediately available for consultation/collaboration.     Veryl Speak, MD 03/12/13 [phone]

## 2013-03-12 NOTE — ED Notes (Signed)
Pt. Stated, people tell drainage and a little burning

## 2013-03-12 NOTE — ED Provider Notes (Addendum)
CSN: [id number]     Arrival date & time 03/12/13  1021 History   First MD Initiated Contact with Patient 03/12/13 1112     Chief Complaint  Patient presents with  . Eye Problem  . Hyperglycemia   (Consider location/radiation/quality/duration/timing/severity/associated sxs/prior Treatment) Patient is a 52 y.o. female presenting with eye problem and hyperglycemia.  Eye Problem Associated symptoms: redness   Hyperglycemia  Complains of redness of right eye and eye lashes stuck together upon awakening for the past 2 days. She denies fever no treatment prior to coming here Also complains of right upper toothache since yesterday. Patient reports she's been out of all of her medications for the past month and half. She cannot afford her insulin. No treatment prior to coming here. Nothing makes symptoms better or worse. No fever. No other associated symptoms. She denies generalized weakness. Past Medical History  Diagnosis Date  . Hypertension   . Diabetes mellitus    Past Surgical History  Procedure Laterality Date  . Tubal ligation     Family History  Problem Relation Age of Onset  . Diabetes type II Father   . Diabetes Mellitus II Brother   . CAD Father   . Prostate cancer Father    History  Substance Use Topics  . Smoking status: Never Smoker   . Smokeless tobacco: Not on file  . Alcohol Use: Yes   OB History   Grav Para Term Preterm Abortions TAB SAB Ect Mult Living                 Review of Systems  Constitutional: Negative.   HENT: Negative.        Toothache  Eyes: Positive for redness.  Respiratory: Negative.   Cardiovascular: Negative.   Gastrointestinal: Negative.   Musculoskeletal: Negative.   Skin: Negative.   Neurological: Negative.   Psychiatric/Behavioral: Negative.   All other systems reviewed and are negative.    Allergies  Review of patient's allergies indicates no known allergies.  Home Medications   Current Outpatient Rx  Name  Route  Sig   Dispense  Refill  . lisinopril (PRINIVIL,ZESTRIL) 20 MG tablet   Oral   Take 1 tablet (20 mg total) by mouth daily.   30 tablet   0   . metFORMIN (GLUCOPHAGE) 500 MG tablet   Oral   Take 1 tablet (500 mg total) by mouth 2 (two) times daily with a meal.   60 tablet   0    BP 136/85  Pulse 71  Temp(Src) 97.5 F (36.4 C) (Oral)  Resp 16  Ht 5\' 2"  (1.575 m)  Wt 174 lb 1 oz (78.954 kg)  BMI 31.83 kg/m2  SpO2 95%  LMP 02/27/2013 Physical Exam  Nursing note and vitals reviewed. Constitutional: She appears well-developed and well-nourished.  HENT:  Head: Normocephalic and atraumatic.  Generally poor dentition. No trismus. Right upper premolar is missing. Socket is tender, mildly swollen.  Eyes: EOM are normal. Pupils are equal, round, and reactive to light.  Right eye with minimal subconjunctival erythema left eye normal  Neck: Neck supple. No tracheal deviation present. No thyromegaly present.  Cardiovascular: Normal rate and regular rhythm.   No murmur heard. Pulmonary/Chest: Effort normal and breath sounds normal.  Abdominal: Soft. Bowel sounds are normal. She exhibits no distension. There is no tenderness.  Musculoskeletal: Normal range of motion. She exhibits no edema and no tenderness.  Neurological: She is alert. Coordination normal.  Skin: Skin is warm and dry. No rash  noted.  Psychiatric: She has a normal mood and affect.    ED Course  Procedures (including critical care time) Labs Review Labs Reviewed  GLUCOSE, CAPILLARY - Abnormal; Notable for the following:    Glucose-Capillary 312 (*)    All other components within normal limits   Imaging Review No results found.  EKG Interpretation   None      Case manager consult to evaluate patient for medications . Results for orders placed during the hospital encounter of 03/12/13  GLUCOSE, CAPILLARY      Result Value Range   Glucose-Capillary 312 (*) 70 - 99 mg/dL   Comment 1 Documented in Chart    POCT  I-STAT, CHEM 8      Result Value Range   Sodium 136 (*) 137 - 147 mEq/L   Potassium 3.9  3.7 - 5.3 mEq/L   Chloride 100  96 - 112 mEq/L   BUN 12  6 - 23 mg/dL   Creatinine, Ser 0.60  0.50 - 1.10 mg/dL   Glucose, Bld 380 (*) 70 - 99 mg/dL   Calcium, Ion 1.27 (*) 1.12 - 1.23 mmol/L   TCO2 25  0 - 100 mmol/L   Hemoglobin 13.6  12.0 - 15.0 g/dL   HCT 40.0  36.0 - 46.0 %   No results found.  MDM  No diagnosis found. Patient is type II diabetic and not symptomatic from her hyperglycemia. She has not checked her blood sugar greater than one month however had a blood sugar monitor at home. We will write prescriptions for metformin and lisinopril and penicillin VK. [facility]. Case manager has found her a dentist with whom she is to call in 2 days for next available appointment.  Diagnosis #1 conjunctivitis right eye #2 dental pain #3 hyperglycemia Number medication noncompliance  Orlie Dakin, MD 03/12/13 [place], MD 03/12/13 2035

## 2013-03-12 NOTE — Discharge Planning (Signed)
[facility] Liaison ext [phone]  Follow up appointment was made with the [facility] for 04/16/2013 at 3:00pm to establish primary care. [facility] card eligibility appointment made for 04/02/13 at 12:00pm. Patient is aware of appointments, my contact information was given for any questions or concerns.

## 2013-03-12 NOTE — ED Notes (Signed)
Dr. Dolores Patty at bedside.

## 2013-03-12 NOTE — ED Notes (Signed)
Pt c/o dental pain and possible "pink eye". No redness noted. ALSO, states she has not been taking her Insulin, states someone took it..I can't afford it".

## 2013-03-12 NOTE — ED Notes (Signed)
Pt moved to POD E-47

## 2013-03-27 ENCOUNTER — Telehealth (HOSPITAL_COMMUNITY): Payer: Self-pay

## 2013-04-02 ENCOUNTER — Ambulatory Visit: Payer: Self-pay

## 2013-04-16 ENCOUNTER — Ambulatory Visit: Payer: Self-pay

## 2013-04-16 NOTE — Progress Notes (Unsigned)
Patient ID: Gina [name], female   DOB: [birth year], 53 y.o.   MRN: [id number]

## 2013-05-18 ENCOUNTER — Emergency Department (HOSPITAL_COMMUNITY)
Admission: EM | Admit: 2013-05-18 | Discharge: 2013-05-18 | Disposition: A | Discharge status: Home / Self Care | Payer: Self-pay | Attending: Emergency Medicine | Admitting: Emergency Medicine

## 2013-05-18 ENCOUNTER — Emergency Department (HOSPITAL_COMMUNITY): Payer: Self-pay

## 2013-05-18 ENCOUNTER — Encounter (HOSPITAL_COMMUNITY): Payer: Self-pay | Admitting: Emergency Medicine

## 2013-05-18 DIAGNOSIS — R51 Headache: Secondary | ICD-10-CM | POA: Insufficient documentation

## 2013-05-18 DIAGNOSIS — Z3202 Encounter for pregnancy test, result negative: Secondary | ICD-10-CM | POA: Insufficient documentation

## 2013-05-18 DIAGNOSIS — R739 Hyperglycemia, unspecified: Secondary | ICD-10-CM

## 2013-05-18 DIAGNOSIS — I10 Essential (primary) hypertension: Secondary | ICD-10-CM | POA: Insufficient documentation

## 2013-05-18 DIAGNOSIS — Z79899 Other long term (current) drug therapy: Secondary | ICD-10-CM | POA: Insufficient documentation

## 2013-05-18 DIAGNOSIS — E119 Type 2 diabetes mellitus without complications: Secondary | ICD-10-CM | POA: Insufficient documentation

## 2013-05-18 DIAGNOSIS — R519 Headache, unspecified: Secondary | ICD-10-CM

## 2013-05-18 DIAGNOSIS — R1033 Periumbilical pain: Secondary | ICD-10-CM | POA: Insufficient documentation

## 2013-05-18 LAB — CBC WITH DIFFERENTIAL/PLATELET
Basophils Absolute: 0 10*3/uL (ref 0.0–0.1)
Basophils Relative: 0 % (ref 0–1)
Eosinophils Absolute: 0.1 10*3/uL (ref 0.0–0.7)
Eosinophils Relative: 2 % (ref 0–5)
HCT: 32 % — ABNORMAL LOW (ref 36.0–46.0)
HEMOGLOBIN: 11.3 g/dL — AB (ref 12.0–15.0)
LYMPHS ABS: 2.2 10*3/uL (ref 0.7–4.0)
Lymphocytes Relative: 30 % (ref 12–46)
MCH: 30.9 pg (ref 26.0–34.0)
MCHC: 35.3 g/dL (ref 30.0–36.0)
MCV: 87.4 fL (ref 78.0–100.0)
MONO ABS: 0.9 10*3/uL (ref 0.1–1.0)
MONOS PCT: 12 % (ref 3–12)
NEUTROS ABS: 4.2 10*3/uL (ref 1.7–7.7)
NEUTROS PCT: 56 % (ref 43–77)
Platelets: 238 10*3/uL (ref 150–400)
RBC: 3.66 MIL/uL — AB (ref 3.87–5.11)
RDW: 13.5 % (ref 11.5–15.5)
WBC: 7.4 10*3/uL (ref 4.0–10.5)

## 2013-05-18 LAB — I-STAT VENOUS BLOOD GAS, ED
ACID-BASE EXCESS: 1 mmol/L (ref 0.0–2.0)
Bicarbonate: 23.5 mEq/L (ref 20.0–24.0)
O2 Saturation: 99 %
PO2 VEN: 109 mmHg — AB (ref 30.0–45.0)
TCO2: 24 mmol/L (ref 0–100)
pCO2, Ven: 30.1 mmHg — ABNORMAL LOW (ref 45.0–50.0)
pH, Ven: 7.5 — ABNORMAL HIGH (ref 7.250–7.300)

## 2013-05-18 LAB — RAPID URINE DRUG SCREEN, HOSP PERFORMED
Amphetamines: NOT DETECTED
BARBITURATES: NOT DETECTED
Benzodiazepines: NOT DETECTED
COCAINE: NOT DETECTED
Opiates: NOT DETECTED
Tetrahydrocannabinol: NOT DETECTED

## 2013-05-18 LAB — COMPREHENSIVE METABOLIC PANEL
ALK PHOS: 110 U/L (ref 39–117)
ALT: 19 U/L (ref 0–35)
AST: 19 U/L (ref 0–37)
Albumin: 3 g/dL — ABNORMAL LOW (ref 3.5–5.2)
BUN: 12 mg/dL (ref 6–23)
CHLORIDE: 98 meq/L (ref 96–112)
CO2: 22 meq/L (ref 19–32)
CREATININE: 0.48 mg/dL — AB (ref 0.50–1.10)
Calcium: 9.6 mg/dL (ref 8.4–10.5)
GLUCOSE: 462 mg/dL — AB (ref 70–99)
POTASSIUM: 4.4 meq/L (ref 3.7–5.3)
Sodium: 135 mEq/L — ABNORMAL LOW (ref 137–147)
Total Protein: 7.2 g/dL (ref 6.0–8.3)

## 2013-05-18 LAB — URINALYSIS, ROUTINE W REFLEX MICROSCOPIC
Bilirubin Urine: NEGATIVE
Glucose, UA: >1000 mg/dL — AB
Hgb urine dipstick: NEGATIVE
KETONES UR: 15 mg/dL — AB
NITRITE: NEGATIVE
PROTEIN: NEGATIVE mg/dL
Specific Gravity, Urine: 1.031 — ABNORMAL HIGH (ref 1.005–1.030)
Urobilinogen, UA: 0.2 mg/dL (ref 0.0–1.0)
pH: 6.5 (ref 5.0–8.0)

## 2013-05-18 LAB — URINE MICROSCOPIC-ADD ON

## 2013-05-18 LAB — CBG MONITORING, ED
GLUCOSE-CAPILLARY: 440 mg/dL — AB (ref 70–99)
GLUCOSE-CAPILLARY: 308 mg/dL — AB (ref 70–99)
Glucose-Capillary: 345 mg/dL — ABNORMAL HIGH (ref 70–99)

## 2013-05-18 LAB — LIPASE, BLOOD: LIPASE: 41 U/L (ref 11–59)

## 2013-05-18 LAB — POC URINE PREG, ED: PREG TEST UR: NEGATIVE

## 2013-05-18 MED ORDER — DIPHENHYDRAMINE HCL 50 MG/ML IJ SOLN
12.5000 mg | Freq: Once | INTRAMUSCULAR | Status: AC
  Administered 2013-05-18: 12.5 mg via INTRAVENOUS
  Filled 2013-05-18: qty 1

## 2013-05-18 MED ORDER — METOCLOPRAMIDE HCL 5 MG/ML IJ SOLN
5.0000 mg | Freq: Once | INTRAMUSCULAR | Status: AC
  Administered 2013-05-18: 5 mg via INTRAVENOUS
  Filled 2013-05-18: qty 2

## 2013-05-18 MED ORDER — LISINOPRIL 20 MG PO TABS: 20.0000 mg | ORAL_TABLET | Freq: Every day | ORAL | Status: DC

## 2013-05-18 MED ORDER — SODIUM CHLORIDE 0.9 % IV BOLUS (SEPSIS)
1000.0000 mL | INTRAVENOUS | Status: AC
  Administered 2013-05-18: 1000 mL via INTRAVENOUS

## 2013-05-18 MED ORDER — ONDANSETRON HCL 4 MG/2ML IJ SOLN: 4.0000 mg | Freq: Once | INTRAMUSCULAR | Status: DC

## 2013-05-18 MED ORDER — METFORMIN HCL 500 MG PO TABS: 500.0000 mg | ORAL_TABLET | Freq: Two times a day (BID) | ORAL | Status: DC

## 2013-05-18 MED ORDER — METFORMIN HCL 500 MG PO TABS
500.0000 mg | ORAL_TABLET | Freq: Two times a day (BID) | ORAL | Status: DC
  Administered 2013-05-18: 500 mg via ORAL
  Filled 2013-05-18: qty 1

## 2013-05-18 MED ORDER — LISINOPRIL 20 MG PO TABS
20.0000 mg | ORAL_TABLET | Freq: Every day | ORAL | Status: DC
Start: 2013-05-18 — End: 2013-05-18
  Administered 2013-05-18: 20 mg via ORAL
  Filled 2013-05-18: qty 1

## 2013-05-18 NOTE — Progress Notes (Signed)
CM spoke with Dr Aline Brochure RE Patients Plan.Will continue to monitor and provide CM assistance.

## 2013-05-18 NOTE — ED Provider Notes (Signed)
CSN: [id number]     Arrival date & time 05/18/13  [id number] History   First MD Initiated Contact with Patient 05/18/13 [phone]     Chief Complaint  Patient presents with  . Hyperglycemia     (Consider location/radiation/quality/duration/timing/severity/associated sxs/prior Treatment) Patient is a 53 y.o. female presenting with hyperglycemia. The history is provided by the patient.  Hyperglycemia Blood sugar level PTA:  516 Severity:  Mild Onset quality:  Gradual Timing:  Constant Progression:  Worsening Chronicity:  New Diabetes status:  Controlled with oral medications Current diabetic therapy:  Metformin Relieved by:  Nothing Ineffective treatments:  None tried Associated symptoms: abdominal pain and nausea   Associated symptoms: no chest pain, no dizziness, no dysuria, no fatigue, no fever, no shortness of breath and no vomiting   Abdominal pain:    Location:  Periumbilical   Quality:  Unable to specify   Severity:  Mild   Onset quality:  Gradual   Duration:  2 days   Timing:  Constant   Progression:  Unchanged   Chronicity:  New   Past Medical History  Diagnosis Date  . Hypertension   . Diabetes mellitus    Past Surgical History  Procedure Laterality Date  . Tubal ligation     Family History  Problem Relation Age of Onset  . Diabetes type II Father   . Diabetes Mellitus II Brother   . CAD Father   . Prostate cancer Father    History  Substance Use Topics  . Smoking status: Never Smoker   . Smokeless tobacco: Not on file  . Alcohol Use: Yes     Comment: occasionally   OB History   Grav Para Term Preterm Abortions TAB SAB Ect Mult Living                 Review of Systems  Constitutional: Negative for fever and fatigue.  HENT: Negative for congestion and drooling.   Eyes: Negative for pain.  Respiratory: Negative for cough and shortness of breath.   Cardiovascular: Negative for chest pain.  Gastrointestinal: Positive for nausea and abdominal pain. Negative  for vomiting and diarrhea.  Genitourinary: Positive for frequency. Negative for dysuria and hematuria.  Musculoskeletal: Negative for back pain, gait problem and neck pain.  Skin: Negative for color change.  Neurological: Positive for headaches (gradual in onset, diffuse aching pain). Negative for dizziness.  Hematological: Negative for adenopathy.  Psychiatric/Behavioral: Negative for behavioral problems.  All other systems reviewed and are negative.      Allergies  Review of patient's allergies indicates no known allergies.  Home Medications   Current Outpatient Rx  Name  Route  Sig  Dispense  Refill  . lisinopril (PRINIVIL,ZESTRIL) 20 MG tablet   Oral   Take 1 tablet (20 mg total) by mouth daily.   30 tablet   0   . lisinopril (PRINIVIL,ZESTRIL) 20 MG tablet   Oral   Take 0.5 tablets (10 mg total) by mouth daily.   20 tablet   0   . metFORMIN (GLUCOPHAGE) 1000 MG tablet   Oral   Take 1 tablet (1,000 mg total) by mouth once.   40 tablet   0   . metFORMIN (GLUCOPHAGE) 500 MG tablet   Oral   Take 1 tablet (500 mg total) by mouth 2 (two) times daily with a meal.   60 tablet   0   . penicillin v potassium (VEETID) 500 MG tablet   Oral   Take 1 tablet (500  mg total) by mouth 3 (three) times daily.   30 tablet   0    LMP 05/08/2013 Physical Exam  Nursing note and vitals reviewed. Constitutional: She appears well-developed and well-nourished.  HENT:  Head: Normocephalic.  Mouth/Throat: Oropharynx is clear and moist. No oropharyngeal exudate.  Eyes: Conjunctivae and EOM are normal. Pupils are equal, round, and reactive to light.  Neck: Normal range of motion. Neck supple.  Cardiovascular: Normal rate, regular rhythm, normal heart sounds and intact distal pulses.  Exam reveals no gallop and no friction rub.   No murmur heard. Pulmonary/Chest: Effort normal and breath sounds normal. No respiratory distress. She has no wheezes.  Abdominal: Soft. Bowel sounds are  normal. There is no tenderness. There is no rebound and no guarding.  Musculoskeletal: Normal range of motion. She exhibits edema (trace pitting edema in bilateral lower extremities. Mild asymmetry noted with slightly worse edema in the left lower extremity. No focal tenderness of either lower extremities. 2+ distal pulses.). She exhibits no tenderness.  Neurological: She is alert. She has normal strength. No cranial nerve deficit or sensory deficit. Coordination and gait normal.  Appears mildly drowsy.   alert, oriented x3 speech: normal in context and clarity memory: intact grossly cranial nerves II-XII: intact motor strength: full proximally and distally no involuntary movements or tremors sensation: intact to light touch diffusely  cerebellar: finger-to-nose normal bilaterally gait: normal forwards and backwards   Skin: Skin is warm and dry.  Psychiatric: She has a normal mood and affect. Her behavior is normal.    ED Course  Procedures (including critical care time) Labs Review Labs Reviewed  CBC WITH DIFFERENTIAL - Abnormal; Notable for the following:    RBC 3.66 (*)    Hemoglobin 11.3 (*)    HCT 32.0 (*)    All other components within normal limits  COMPREHENSIVE METABOLIC PANEL - Abnormal; Notable for the following:    Sodium 135 (*)    Glucose, Bld 462 (*)    Creatinine, Ser 0.48 (*)    Albumin 3.0 (*)    Total Bilirubin <0.2 (*)    All other components within normal limits  URINALYSIS, ROUTINE W REFLEX MICROSCOPIC - Abnormal; Notable for the following:    Specific Gravity, Urine 1.031 (*)    Glucose, UA >1000 (*)    Ketones, ur 15 (*)    Leukocytes, UA SMALL (*)    All other components within normal limits  URINE MICROSCOPIC-ADD ON - Abnormal; Notable for the following:    Squamous Epithelial / LPF FEW (*)    Bacteria, UA FEW (*)    All other components within normal limits  CBG MONITORING, ED - Abnormal; Notable for the following:    Glucose-Capillary 440 (*)     All other components within normal limits  I-STAT VENOUS BLOOD GAS, ED - Abnormal; Notable for the following:    pH, Ven 7.500 (*)    pCO2, Ven 30.1 (*)    pO2, Ven 109.0 (*)    All other components within normal limits  CBG MONITORING, ED - Abnormal; Notable for the following:    Glucose-Capillary 345 (*)    All other components within normal limits  CBG MONITORING, ED - Abnormal; Notable for the following:    Glucose-Capillary 308 (*)    All other components within normal limits  LIPASE, BLOOD  URINE RAPID DRUG SCREEN (HOSP PERFORMED)  POC URINE PREG, ED   Imaging Review Ct Head Wo Contrast  05/18/2013   CLINICAL DATA:  Global headache.  Drowsy.  EXAM: CT HEAD WITHOUT CONTRAST  TECHNIQUE: Contiguous axial images were obtained from the base of the skull through the vertex without intravenous contrast.  COMPARISON:  04/29/2010  FINDINGS: No acute intracranial abnormality. Specifically, no hemorrhage, hydrocephalus, mass lesion, acute infarction, or significant intracranial injury. No acute calvarial abnormality. Visualized paranasal sinuses and mastoids clear. Orbital soft tissues unremarkable.  IMPRESSION: No intracranial abnormality.   Electronically Signed   By: Rolm Baptise M.D.   On: 05/18/2013 10:41     EKG Interpretation None      MDM   Final diagnoses:  Hyperglycemia  HA (headache)    8:52 AM 53 y.o. female who presents with complaint of feeling sick. She has multiple complaints including mild headache, feeling thirsty, increased urinary frequency, nausea, and uncomfortable sensation in her abdomen. She is homeless and states that she has been out of her diabetes and blood pressure medicines for months. She denies any fevers, vomiting, or diarrhea. She has no focal tenderness to palpation of her abdomen which is soft and benign. She is afebrile and vital signs are unremarkable here. Will start with screening labwork and IV fluid. Will tx HA w/ cocktail.   12:23 PM: I  interpreted/reviewed the labs and/or imaging which were non-contributory.  No acidosis. BS decreasing appropriately w/ IVF hydration. Caremanager spoke to the pt about options for getting her meds. She is now more alert on exam. HA now gone. She is feeling much better. Abd remains soft and benign. Will provide Rx's for her meds.  I have discussed the diagnosis/risks/treatment options with the patient and believe the pt to be eligible for discharge home to follow-up at the wellness center, caremanager sent them an email. We also discussed returning to the ED immediately if new or worsening sx occur. We discussed the sx which are most concerning (e.g., further hyperglycemia, return of HA, abd pain, fever) that necessitate immediate return. Medications administered to the patient during their visit and any new prescriptions provided to the patient are listed below.  Medications given during this visit Medications  lisinopril (PRINIVIL,ZESTRIL) tablet 20 mg (20 mg Oral Given 05/18/13 1124)  metFORMIN (GLUCOPHAGE) tablet 500 mg (500 mg Oral Given 05/18/13 1125)  sodium chloride 0.9 % bolus 1,000 mL (1,000 mLs Intravenous New Bag/Given 05/18/13 1130)  sodium chloride 0.9 % bolus 1,000 mL (0 mLs Intravenous Stopped 05/18/13 1100)  metoCLOPramide (REGLAN) injection 5 mg (5 mg Intravenous Given 05/18/13 0902)  diphenhydrAMINE (BENADRYL) injection 12.5 mg (12.5 mg Intravenous Given 05/18/13 0902)    Discharge Medication List as of 05/18/2013 12:25 PM    START taking these medications   Details  !! lisinopril (PRINIVIL,ZESTRIL) 20 MG tablet Take 1 tablet (20 mg total) by mouth daily., Starting 05/18/2013, Until Discontinued, Print    !! metFORMIN (GLUCOPHAGE) 500 MG tablet Take 1 tablet (500 mg total) by mouth 2 (two) times daily with a meal., Starting 05/18/2013, Until Discontinued, Print     !! - Potential duplicate medications found. Please discuss with provider.       Blanchard Kelch, MD 05/18/13 2101

## 2013-05-18 NOTE — ED Notes (Signed)
Phlebotomy at bedside.

## 2013-05-18 NOTE — Progress Notes (Signed)
Patient awake.CM education provided on Establishing Primary care services.Patient provided her verbal consent for CM to e-mail the [facility]. Patient education provided on resource packet.Patient reports friend will take her home once she is discharged.No further CM needs.

## 2013-05-18 NOTE — ED Notes (Signed)
Social Designer, multimedia at bedside.

## 2013-05-18 NOTE — ED Notes (Signed)
Per EMS - pt started to feel bad yesterday and progressively got worse, pt c/o HA 8/10, hx of DM and HTN. CBG 516. EMS administered 250 ml NS bolus. Pt c/o feeling thirsty and increased urinary frequency. Pt sts she hasn't been taking her meds for about a year d/t no money. BP 141/85 HR 73. RR 22. Denies nausea. Pt sts she is homeless.

## 2013-05-18 NOTE — ED Notes (Signed)
Pt returned from radiology.

## 2013-05-18 NOTE — Progress Notes (Signed)
Weekend CSW, alongside ED CM, met with patient to assess and offer resources. Patient reports that she currently stays with her brother. CSW inquired about her relationship with her children whom are listed as emergency contacts in medical chart. Patient reports that she is "close" with her children and that they are a support system for her. Patient believes that she may have [place] insurance through her old job but cannot verify that she currently has health insurance. Patient denies substance abuse. Patient interested in information about housing resources. Patient lethargic during assessment and appears to be falling asleep. CSW will provide patient with resource packet including information on local shelters, low income apartments, the [facility], food banks, and a medicaid application before discharge.  Tilden Fossa, MSW, [place] Clinical Social Worker [facility] Emergency Dept. [phone]

## 2013-05-18 NOTE — Progress Notes (Signed)
Case Manager met patient at bedside.Role of CM explained.Patient appears lethargic and barely opening her eyes.CM attempted to educate patient her Diabetic medications And Antibiotics can be obtained free of charge via the [employer] generic medication program,patients Lisinopril is on the [employer] $4 dollar plan. CM left a  Resource sheet for the [facility].

## 2013-06-17 ENCOUNTER — Encounter (HOSPITAL_COMMUNITY): Payer: Self-pay | Admitting: Emergency Medicine

## 2013-06-17 DIAGNOSIS — Z5987 Material hardship due to limited financial resources, not elsewhere classified: Secondary | ICD-10-CM

## 2013-06-17 DIAGNOSIS — N10 Acute tubulo-interstitial nephritis: Secondary | ICD-10-CM | POA: Diagnosis present

## 2013-06-17 DIAGNOSIS — Z6830 Body mass index (BMI) 30.0-30.9, adult: Secondary | ICD-10-CM

## 2013-06-17 DIAGNOSIS — K219 Gastro-esophageal reflux disease without esophagitis: Secondary | ICD-10-CM | POA: Diagnosis present

## 2013-06-17 DIAGNOSIS — Z91199 Patient's noncompliance with other medical treatment and regimen due to unspecified reason: Secondary | ICD-10-CM

## 2013-06-17 DIAGNOSIS — Z9119 Patient's noncompliance with other medical treatment and regimen: Secondary | ICD-10-CM

## 2013-06-17 DIAGNOSIS — Z794 Long term (current) use of insulin: Secondary | ICD-10-CM

## 2013-06-17 DIAGNOSIS — Z598 Other problems related to housing and economic circumstances: Secondary | ICD-10-CM

## 2013-06-17 DIAGNOSIS — D649 Anemia, unspecified: Secondary | ICD-10-CM | POA: Diagnosis present

## 2013-06-17 DIAGNOSIS — Z79899 Other long term (current) drug therapy: Secondary | ICD-10-CM

## 2013-06-17 DIAGNOSIS — E46 Unspecified protein-calorie malnutrition: Secondary | ICD-10-CM | POA: Diagnosis present

## 2013-06-17 DIAGNOSIS — D72829 Elevated white blood cell count, unspecified: Secondary | ICD-10-CM | POA: Diagnosis present

## 2013-06-17 DIAGNOSIS — I10 Essential (primary) hypertension: Secondary | ICD-10-CM | POA: Diagnosis present

## 2013-06-17 DIAGNOSIS — E1010 Type 1 diabetes mellitus with ketoacidosis without coma: Principal | ICD-10-CM | POA: Diagnosis present

## 2013-06-17 LAB — COMPREHENSIVE METABOLIC PANEL
ALT: 17 U/L (ref 0–35)
AST: 15 U/L (ref 0–37)
Albumin: 2.8 g/dL — ABNORMAL LOW (ref 3.5–5.2)
Alkaline Phosphatase: 75 U/L (ref 39–117)
BUN: 13 mg/dL (ref 6–23)
CALCIUM: 9 mg/dL (ref 8.4–10.5)
CHLORIDE: 93 meq/L — AB (ref 96–112)
CO2: 17 mEq/L — ABNORMAL LOW (ref 19–32)
Creatinine, Ser: 0.51 mg/dL (ref 0.50–1.10)
GFR calc Af Amer: >90 mL/min (ref >90)
GFR calc non Af Amer: >90 mL/min (ref >90)
Glucose, Bld: 552 mg/dL (ref 70–99)
Potassium: 5.2 mEq/L (ref 3.7–5.3)
Sodium: 131 mEq/L — ABNORMAL LOW (ref 137–147)
Total Bilirubin: 0.4 mg/dL (ref 0.3–1.2)
Total Protein: 7.6 g/dL (ref 6.0–8.3)

## 2013-06-17 LAB — CBC WITH DIFFERENTIAL/PLATELET
Basophils Absolute: 0 10*3/uL (ref 0.0–0.1)
Basophils Relative: 0 % (ref 0–1)
EOS PCT: 0 % (ref 0–5)
Eosinophils Absolute: 0 10*3/uL (ref 0.0–0.7)
HCT: 33.8 % — ABNORMAL LOW (ref 36.0–46.0)
Hemoglobin: 11.6 g/dL — ABNORMAL LOW (ref 12.0–15.0)
LYMPHS PCT: 13 % (ref 12–46)
Lymphs Abs: 1.2 10*3/uL (ref 0.7–4.0)
MCH: 30.4 pg (ref 26.0–34.0)
MCHC: 34.3 g/dL (ref 30.0–36.0)
MCV: 88.7 fL (ref 78.0–100.0)
Monocytes Absolute: 1.3 10*3/uL — ABNORMAL HIGH (ref 0.1–1.0)
Monocytes Relative: 14 % — ABNORMAL HIGH (ref 3–12)
NEUTROS ABS: 6.9 10*3/uL (ref 1.7–7.7)
NEUTROS PCT: 73 % (ref 43–77)
Platelets: 253 10*3/uL (ref 150–400)
RBC: 3.81 MIL/uL — ABNORMAL LOW (ref 3.87–5.11)
RDW: 13.8 % (ref 11.5–15.5)
WBC: 9.4 10*3/uL (ref 4.0–10.5)

## 2013-06-17 NOTE — ED Notes (Signed)
Patient presents with numerous complaints.  States has not been able to eat for the last 3 days feels nauseaous, does not have her insulin or metformin, and had a boil on her buttock

## 2013-06-17 NOTE — ED Notes (Signed)
Glucose 552 per lab  Dr. Alvino Chapel notified

## 2013-06-18 ENCOUNTER — Inpatient Hospital Stay (HOSPITAL_COMMUNITY)
Admission: EM | Admit: 2013-06-18 | Discharge: 2013-06-21 | DRG: 638 | Disposition: A | Discharge status: Home / Self Care | Payer: Self-pay | Attending: Internal Medicine | Admitting: Internal Medicine

## 2013-06-18 ENCOUNTER — Inpatient Hospital Stay (HOSPITAL_COMMUNITY): Payer: Self-pay

## 2013-06-18 ENCOUNTER — Encounter (HOSPITAL_COMMUNITY): Payer: Self-pay | Admitting: Internal Medicine

## 2013-06-18 DIAGNOSIS — N12 Tubulo-interstitial nephritis, not specified as acute or chronic: Secondary | ICD-10-CM

## 2013-06-18 DIAGNOSIS — E1165 Type 2 diabetes mellitus with hyperglycemia: Secondary | ICD-10-CM

## 2013-06-18 DIAGNOSIS — K219 Gastro-esophageal reflux disease without esophagitis: Secondary | ICD-10-CM

## 2013-06-18 DIAGNOSIS — R109 Unspecified abdominal pain: Secondary | ICD-10-CM

## 2013-06-18 DIAGNOSIS — I10 Essential (primary) hypertension: Secondary | ICD-10-CM

## 2013-06-18 DIAGNOSIS — D649 Anemia, unspecified: Secondary | ICD-10-CM

## 2013-06-18 DIAGNOSIS — E1110 Type 2 diabetes mellitus with ketoacidosis without coma: Secondary | ICD-10-CM

## 2013-06-18 DIAGNOSIS — IMO0001 Reserved for inherently not codable concepts without codable children: Secondary | ICD-10-CM

## 2013-06-18 LAB — RETICULOCYTES
RBC.: 3.58 MIL/uL — ABNORMAL LOW (ref 3.87–5.11)
RETIC CT PCT: 0.5 % (ref 0.4–3.1)
Retic Count, Absolute: 17.9 10*3/uL — ABNORMAL LOW (ref 19.0–186.0)

## 2013-06-18 LAB — CBC WITH DIFFERENTIAL/PLATELET
Basophils Absolute: 0 10*3/uL (ref 0.0–0.1)
Basophils Relative: 0 % (ref 0–1)
EOS PCT: 0 % (ref 0–5)
Eosinophils Absolute: 0 10*3/uL (ref 0.0–0.7)
HEMATOCRIT: 33.6 % — AB (ref 36.0–46.0)
Hemoglobin: 11.4 g/dL — ABNORMAL LOW (ref 12.0–15.0)
LYMPHS ABS: 2.8 10*3/uL (ref 0.7–4.0)
LYMPHS PCT: 23 % (ref 12–46)
MCH: 30.3 pg (ref 26.0–34.0)
MCHC: 33.9 g/dL (ref 30.0–36.0)
MCV: 89.4 fL (ref 78.0–100.0)
MONOS PCT: 10 % (ref 3–12)
Monocytes Absolute: 1.2 10*3/uL — ABNORMAL HIGH (ref 0.1–1.0)
Neutro Abs: 8 10*3/uL — ABNORMAL HIGH (ref 1.7–7.7)
Neutrophils Relative %: 67 % (ref 43–77)
PLATELETS: 268 10*3/uL (ref 150–400)
RBC: 3.76 MIL/uL — AB (ref 3.87–5.11)
RDW: 13.9 % (ref 11.5–15.5)
WBC: 12 10*3/uL — AB (ref 4.0–10.5)

## 2013-06-18 LAB — BASIC METABOLIC PANEL
BUN: 7 mg/dL (ref 6–23)
BUN: 7 mg/dL (ref 6–23)
CALCIUM: 8.5 mg/dL (ref 8.4–10.5)
CALCIUM: 8.4 mg/dL (ref 8.4–10.5)
CHLORIDE: 99 meq/L (ref 96–112)
CO2: 22 meq/L (ref 19–32)
CO2: 17 mEq/L — ABNORMAL LOW (ref 19–32)
CREATININE: 0.45 mg/dL — AB (ref 0.50–1.10)
CREATININE: 0.43 mg/dL — AB (ref 0.50–1.10)
Chloride: 102 mEq/L (ref 96–112)
Glucose, Bld: 96 mg/dL (ref 70–99)
Glucose, Bld: 156 mg/dL — ABNORMAL HIGH (ref 70–99)
Potassium: 3.7 mEq/L (ref 3.7–5.3)
Potassium: 4 mEq/L (ref 3.7–5.3)
Sodium: 138 mEq/L (ref 137–147)
Sodium: 135 mEq/L — ABNORMAL LOW (ref 137–147)

## 2013-06-18 LAB — I-STAT VENOUS BLOOD GAS, ED
Acid-base deficit: 3 mmol/L — ABNORMAL HIGH (ref 0.0–2.0)
Bicarbonate: 19.7 mEq/L — ABNORMAL LOW (ref 20.0–24.0)
O2 Saturation: 93 %
PCO2 VEN: 29.4 mmHg — AB (ref 45.0–50.0)
PH VEN: 7.435 — AB (ref 7.250–7.300)
TCO2: 21 mmol/L (ref 0–100)
pO2, Ven: 63 mmHg — ABNORMAL HIGH (ref 30.0–45.0)

## 2013-06-18 LAB — HEPATIC FUNCTION PANEL
ALK PHOS: 68 U/L (ref 39–117)
ALT: 16 U/L (ref 0–35)
AST: 13 U/L (ref 0–37)
Albumin: 2.5 g/dL — ABNORMAL LOW (ref 3.5–5.2)
Bilirubin, Direct: <0.2 mg/dL (ref 0.0–0.3)
TOTAL PROTEIN: 6.7 g/dL (ref 6.0–8.3)
Total Bilirubin: 0.3 mg/dL (ref 0.3–1.2)

## 2013-06-18 LAB — URINALYSIS, ROUTINE W REFLEX MICROSCOPIC
Bilirubin Urine: NEGATIVE
Glucose, UA: >1000 mg/dL — AB
Ketones, ur: 40 mg/dL — AB
Leukocytes, UA: NEGATIVE
Nitrite: POSITIVE — AB
Protein, ur: NEGATIVE mg/dL
SPECIFIC GRAVITY, URINE: 1.039 — AB (ref 1.005–1.030)
UROBILINOGEN UA: 0.2 mg/dL (ref 0.0–1.0)
pH: 5.5 (ref 5.0–8.0)

## 2013-06-18 LAB — GLUCOSE, CAPILLARY
GLUCOSE-CAPILLARY: 107 mg/dL — AB (ref 70–99)
Glucose-Capillary: 139 mg/dL — ABNORMAL HIGH (ref 70–99)
Glucose-Capillary: 155 mg/dL — ABNORMAL HIGH (ref 70–99)
Glucose-Capillary: 226 mg/dL — ABNORMAL HIGH (ref 70–99)
Glucose-Capillary: 324 mg/dL — ABNORMAL HIGH (ref 70–99)

## 2013-06-18 LAB — CBG MONITORING, ED
GLUCOSE-CAPILLARY: 278 mg/dL — AB (ref 70–99)
GLUCOSE-CAPILLARY: 271 mg/dL — AB (ref 70–99)
GLUCOSE-CAPILLARY: 218 mg/dL — AB (ref 70–99)
Glucose-Capillary: 428 mg/dL — ABNORMAL HIGH (ref 70–99)
Glucose-Capillary: 232 mg/dL — ABNORMAL HIGH (ref 70–99)
Glucose-Capillary: 81 mg/dL (ref 70–99)
Glucose-Capillary: 75 mg/dL (ref 70–99)

## 2013-06-18 LAB — VITAMIN B12: VITAMIN B 12: 1079 pg/mL — AB (ref 211–911)

## 2013-06-18 LAB — LIPASE, BLOOD: Lipase: 24 U/L (ref 11–59)

## 2013-06-18 LAB — FERRITIN: FERRITIN: 186 ng/mL (ref 10–291)

## 2013-06-18 LAB — TSH: TSH: 1.05 u[IU]/mL (ref 0.350–4.500)

## 2013-06-18 LAB — LIPID PANEL
Cholesterol: 165 mg/dL (ref 0–200)
HDL: 23 mg/dL — AB (ref >39)
LDL Cholesterol: 120 mg/dL — ABNORMAL HIGH (ref 0–99)
Total CHOL/HDL Ratio: 7.2 RATIO
Triglycerides: 109 mg/dL (ref <150)
VLDL: 22 mg/dL (ref 0–40)

## 2013-06-18 LAB — TROPONIN I: Troponin I: <0.3 ng/mL (ref <0.30)

## 2013-06-18 LAB — IRON AND TIBC
Iron: 32 ug/dL — ABNORMAL LOW (ref 42–135)
Saturation Ratios: 12 % — ABNORMAL LOW (ref 20–55)
TIBC: 274 ug/dL (ref 250–470)
UIBC: 242 ug/dL (ref 125–400)

## 2013-06-18 LAB — URINE MICROSCOPIC-ADD ON

## 2013-06-18 LAB — HEMOGLOBIN A1C
Hgb A1c MFr Bld: 15.1 % — ABNORMAL HIGH (ref <5.7)
MEAN PLASMA GLUCOSE: 387 mg/dL — AB (ref <117)

## 2013-06-18 LAB — PREGNANCY, URINE: PREG TEST UR: NEGATIVE

## 2013-06-18 LAB — KETONES, QUALITATIVE

## 2013-06-18 LAB — MRSA PCR SCREENING: MRSA BY PCR: NEGATIVE

## 2013-06-18 LAB — FOLATE: Folate: 13.2 ng/mL

## 2013-06-18 MED ORDER — POTASSIUM CHLORIDE 10 MEQ/100ML IV SOLN: 10.0000 meq | INTRAVENOUS | Status: AC

## 2013-06-18 MED ORDER — SODIUM CHLORIDE 0.9 % IV BOLUS (SEPSIS)
1000.0000 mL | Freq: Once | INTRAVENOUS | Status: AC
  Administered 2013-06-18: 1000 mL via INTRAVENOUS

## 2013-06-18 MED ORDER — SODIUM CHLORIDE 0.9 % IV SOLN: INTRAVENOUS | Status: DC

## 2013-06-18 MED ORDER — PIPERACILLIN-TAZOBACTAM 3.375 G IVPB
3.3750 g | Freq: Three times a day (TID) | INTRAVENOUS | Status: AC
  Administered 2013-06-18 – 2013-06-20 (×8): 3.375 g via INTRAVENOUS
  Filled 2013-06-18 (×8): qty 50

## 2013-06-18 MED ORDER — IOHEXOL 300 MG/ML  SOLN
80.0000 mL | Freq: Once | INTRAMUSCULAR | Status: AC | PRN
  Administered 2013-06-18: 75 mL via INTRAVENOUS

## 2013-06-18 MED ORDER — INSULIN GLARGINE 100 UNIT/ML ~~LOC~~ SOLN
10.0000 [IU] | Freq: Once | SUBCUTANEOUS | Status: AC
  Administered 2013-06-18: 10 [IU] via SUBCUTANEOUS
  Filled 2013-06-18: qty 0.1

## 2013-06-18 MED ORDER — FENTANYL CITRATE 0.05 MG/ML IJ SOLN
25.0000 ug | INTRAMUSCULAR | Status: DC | PRN
  Administered 2013-06-19: 09:00:00 via INTRAVENOUS
  Filled 2013-06-18: qty 2

## 2013-06-18 MED ORDER — INSULIN ASPART 100 UNIT/ML ~~LOC~~ SOLN: 5.0000 [IU] | Freq: Once | SUBCUTANEOUS | Status: DC

## 2013-06-18 MED ORDER — ONDANSETRON HCL 4 MG/2ML IJ SOLN
4.0000 mg | Freq: Once | INTRAMUSCULAR | Status: AC
  Administered 2013-06-18: 4 mg via INTRAVENOUS
  Filled 2013-06-18: qty 2

## 2013-06-18 MED ORDER — INSULIN ASPART 100 UNIT/ML ~~LOC~~ SOLN
0.0000 [IU] | Freq: Every day | SUBCUTANEOUS | Status: DC
  Administered 2013-06-18: 4 [IU] via SUBCUTANEOUS
  Administered 2013-06-19: 2 [IU] via SUBCUTANEOUS

## 2013-06-18 MED ORDER — DEXTROSE-NACL 5-0.45 % IV SOLN
INTRAVENOUS | Status: DC
  Administered 2013-06-18: 12:00:00 via INTRAVENOUS

## 2013-06-18 MED ORDER — INSULIN GLARGINE 100 UNIT/ML ~~LOC~~ SOLN
12.0000 [IU] | Freq: Every day | SUBCUTANEOUS | Status: DC
  Filled 2013-06-18: qty 0.12

## 2013-06-18 MED ORDER — IOHEXOL 300 MG/ML  SOLN
25.0000 mL | Freq: Once | INTRAMUSCULAR | Status: AC | PRN
  Administered 2013-06-18: 25 mL via ORAL

## 2013-06-18 MED ORDER — INSULIN ASPART 100 UNIT/ML ~~LOC~~ SOLN
0.0000 [IU] | Freq: Three times a day (TID) | SUBCUTANEOUS | Status: DC
  Administered 2013-06-18 – 2013-06-19 (×2): 5 [IU] via SUBCUTANEOUS
  Administered 2013-06-19: 15 [IU] via SUBCUTANEOUS
  Administered 2013-06-19: 8 [IU] via SUBCUTANEOUS
  Administered 2013-06-20: 11 [IU] via SUBCUTANEOUS
  Administered 2013-06-20: 2 [IU] via SUBCUTANEOUS
  Administered 2013-06-20: 5 [IU] via SUBCUTANEOUS
  Administered 2013-06-21: 2 [IU] via SUBCUTANEOUS

## 2013-06-18 MED ORDER — DEXTROSE 50 % IV SOLN: 25.0000 mL | INTRAVENOUS | Status: DC | PRN

## 2013-06-18 MED ORDER — SODIUM CHLORIDE 0.9 % IV SOLN
INTRAVENOUS | Status: DC
  Administered 2013-06-18: 3.7 [IU]/h via INTRAVENOUS
  Filled 2013-06-18: qty 1

## 2013-06-18 NOTE — Progress Notes (Signed)
Pt refuses to answer questions or cooperate to complete admission assessment.

## 2013-06-18 NOTE — ED Notes (Signed)
cbg 107
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
## 2013-06-18 NOTE — ED Notes (Signed)
To CT

## 2013-06-18 NOTE — ED Notes (Signed)
Insulin rate up to 6.3

## 2013-06-18 NOTE — H&P (Addendum)
[facility] History and Physical  Gina [name] [id number] DOB: [birth year] DOA: 06/18/2013  Referring physician: ER physician. PCP: Angelica Chessman, MD   Chief Complaint: Abdominal pain.  HPI: Gina [name] is a 53 y.o. female with history of diabetes mellitus, hypertension, presents to the ER because of abdominal pain. Patient has been having generalized abdominal pain colicky in nature with nausea vomiting and denies any diarrhea. Patient states she has been having these symptoms for last 3 days. Denies any fever chills or use of any antibiotics or any recent travel. In the ER patient's labs reveal patient is in DKA. Patient states she has not taken her medications for last few months due to financial issues. Patient otherwise denies any chest pain shortness of breath focal deficits headache or any visual symptoms. Patient has been started on IV insulin and fluids. UA is pending. Patient's last menstrual cycle but denies any vaginal discharge.  Review of Systems: As presented in the history of presenting illness, rest negative.  Past Medical History  Diagnosis Date  . Hypertension   . Diabetes mellitus    Past Surgical History  Procedure Laterality Date  . Tubal ligation     Social History:  reports that she has never smoked. She does not have any smokeless tobacco history on file. She reports that she does not drink alcohol or use illicit drugs. Where does patient live home. Can patient participate in ADLs? Yes.  Allergies  Allergen Reactions  . Metformin And Related Other (See Comments)    Upset stomach    Family History:  Family History  Problem Relation Age of Onset  . Diabetes type II Father   . Diabetes Mellitus II Brother   . CAD Father   . Prostate cancer Father       Prior to Admission medications   Not on File    Physical Exam: Filed Vitals:   06/18/13 0515 06/18/13 0530 06/18/13 0545 06/18/13 0600  BP: 116/64 99/77 162/92 118/62   Pulse: 74 81 90 74  Temp:      TempSrc:      Resp:      Height:      SpO2: 99% 100% 100% 100%     General:  Well-developed and moderately nourished.  Eyes: Anicteric no pallor.  ENT: No discharge from the ears eyes nose mouth.  Neck: No mass felt.  Cardiovascular: S1-S2 heard.  Respiratory: No rhonchi or crepitations.  Abdomen: Soft nontender bowel sounds present no guarding rigidity.  Skin: No rash.  Musculoskeletal: No edema.  Psychiatric: Appears normal.  Neurologic: Alert awake oriented to time place and person. Moves all extremities.  Labs on Admission:  Basic Metabolic Panel:  Recent Labs Lab 06/17/13 2305  NA 131*  K 5.2  CL 93*  CO2 17*  GLUCOSE 552*  BUN 13  CREATININE 0.51  CALCIUM 9.0   Liver Function Tests:  Recent Labs Lab 06/17/13 2305  AST 15  ALT 17  ALKPHOS 75  BILITOT 0.4  PROT 7.6  ALBUMIN 2.8*   No results found for this basename: LIPASE, AMYLASE,  in the last 168 hours No results found for this basename: AMMONIA,  in the last 168 hours CBC:  Recent Labs Lab 06/17/13 2305  WBC 9.4  NEUTROABS 6.9  HGB 11.6*  HCT 33.8*  MCV 88.7  PLT 253   Cardiac Enzymes: No results found for this basename: CKTOTAL, CKMB, CKMBINDEX, TROPONINI,  in the last 168 hours  BNP (last 3 results) No results found  for this basename: PROBNP,  in the last 8760 hours CBG:  Recent Labs Lab 06/18/13 0342 06/18/13 0551  GLUCAP 428* 278*    Radiological Exams on Admission: No results found.   Assessment/Plan Principal Problem:   DKA (diabetic ketoacidoses) Active Problems:   Anemia   Abdominal pain   HTN (hypertension)   1. Diabetic ketoacidosis - probably precipitated by noncompliance with medications. Patient has been placed on IV insulin and IV fluids. Change to long-acting insulin once anion gap is corrected. Patient will be needing more affordable insulin. 2. Abdominal pain - check lipase and CT abdomen. UA is  pending. 3. History of hypertension - presently on no medications. Closely follow blood pressure trends. 4. Anemia - chronic - check anemia panel. Patient still has menstrual cycle. Patient eventually will need colonoscopy. 5. Protein calorie malnutrition - nutrition consult requested.  I have reviewed patient's old charts and labs.  Code Status: Full code.  Family Communication: None.  Disposition Plan: Admit to inpatient.    [facility] Pager [phone].  If 7PM-7AM, please contact night-coverage [email or web address] Password [facility] 06/18/2013, 6:32 AM

## 2013-06-18 NOTE — ED Notes (Signed)
Pt. Vomited small amount of clear liquid in basin.

## 2013-06-18 NOTE — ED Notes (Signed)
Pt done w/ contrast ct called

## 2013-06-18 NOTE — ED Notes (Addendum)
Admit dr paged  to tell them about sugar being 81 insulin drip off at this time

## 2013-06-18 NOTE — ED Notes (Signed)
Lab in to draw labs.

## 2013-06-18 NOTE — ED Provider Notes (Signed)
CSN: [id number]     Arrival date & time 06/17/13  2221 History   First MD Initiated Contact with Patient 06/18/13 [phone]     Chief Complaint  Patient presents with  . Abdominal Pain  . Medication Refill     (Consider location/radiation/quality/duration/timing/severity/associated sxs/prior Treatment) HPI Patient is a 53 yo woman with type 2 DM. She says that she is prescribed insulin but can't afford it. She has been out of Metformin & Lisinopril x 4-5 days.   She presents with fatigue and hyperglycemia. She endorses polyuria and polydypsia.   Denies pain. Denies dysuria. Patient says her mouth has felt dry. Denies N/V/D.  Past Medical History  Diagnosis Date  . Hypertension   . Diabetes mellitus    Past Surgical History  Procedure Laterality Date  . Tubal ligation     Family History  Problem Relation Age of Onset  . Diabetes type II Father   . Diabetes Mellitus II Brother   . CAD Father   . Prostate cancer Father    History  Substance Use Topics  . Smoking status: Never Smoker   . Smokeless tobacco: Not on file  . Alcohol Use: No     Comment: occasionally   OB History   Grav Para Term Preterm Abortions TAB SAB Ect Mult Living                 Review of Systems  Ten point review of symptoms performed and is negative with the exception of symptoms noted above.   Allergies  Review of patient's allergies indicates no known allergies.  Home Medications   Current Outpatient Rx  Name  Route  Sig  Dispense  Refill  . lisinopril (PRINIVIL,ZESTRIL) 20 MG tablet   Oral   Take 1 tablet (20 mg total) by mouth daily.   30 tablet   0   . metFORMIN (GLUCOPHAGE) 500 MG tablet   Oral   Take 1 tablet (500 mg total) by mouth 2 (two) times daily with a meal.   60 tablet   0    BP 127/78  Pulse 90  Temp(Src) 98 F (36.7 C) (Oral)  Resp 18  SpO2 98%  LMP 06/15/2013 Physical Exam Gen: well developed and well nourished appearing Head: NCAT Eyes: PERL, EOMI Nose: no  epistaixis or rhinorrhea Mouth/throat: mucosa is moist and pink, oral mucosa is mildly dehydrated appearing Neck: supple, no stridor Lungs: CTA B, no wheezing, rhonchi or rales CV: RRR, no murmur, extremities appear well perfused.  Abd: soft, notender, nondistended Back: no ttp, no cva ttp Skin: warm and dry, poor skin turgor Ext: normal to inspection, no dependent edema Neuro: CN ii-xii grossly intact, no focal deficits Psyche; normal affect,  calm and cooperative.   ED Course  Procedures (including critical care time) Labs Review  Results for orders placed during the hospital encounter of 06/18/13 (from the past 24 hour(s))  CBC WITH DIFFERENTIAL     Status: Abnormal   Collection Time    06/17/13 11:05 PM      Result Value Ref Range   WBC 9.4  4.0 - 10.5 K/uL   RBC 3.81 (*) 3.87 - 5.11 MIL/uL   Hemoglobin 11.6 (*) 12.0 - 15.0 g/dL   HCT 33.8 (*) 36.0 - 46.0 %   MCV 88.7  78.0 - 100.0 fL   MCH 30.4  26.0 - 34.0 pg   MCHC 34.3  30.0 - 36.0 g/dL   RDW 13.8  11.5 - 15.5 %  Platelets 253  150 - 400 K/uL   Neutrophils Relative % 73  43 - 77 %   Neutro Abs 6.9  1.7 - 7.7 K/uL   Lymphocytes Relative 13  12 - 46 %   Lymphs Abs 1.2  0.7 - 4.0 K/uL   Monocytes Relative 14 (*) 3 - 12 %   Monocytes Absolute 1.3 (*) 0.1 - 1.0 K/uL   Eosinophils Relative 0  0 - 5 %   Eosinophils Absolute 0.0  0.0 - 0.7 K/uL   Basophils Relative 0  0 - 1 %   Basophils Absolute 0.0  0.0 - 0.1 K/uL  COMPREHENSIVE METABOLIC PANEL     Status: Abnormal   Collection Time    06/17/13 11:05 PM      Result Value Ref Range   Sodium 131 (*) 137 - 147 mEq/L   Potassium 5.2  3.7 - 5.3 mEq/L   Chloride 93 (*) 96 - 112 mEq/L   CO2 17 (*) 19 - 32 mEq/L   Glucose, Bld 552 (*) 70 - 99 mg/dL   BUN 13  6 - 23 mg/dL   Creatinine, Ser 0.51  0.50 - 1.10 mg/dL   Calcium 9.0  8.4 - 10.5 mg/dL   Total Protein 7.6  6.0 - 8.3 g/dL   Albumin 2.8 (*) 3.5 - 5.2 g/dL   AST 15  0 - 37 U/L   ALT 17  0 - 35 U/L   Alkaline  Phosphatase 75  39 - 117 U/L   Total Bilirubin 0.4  0.3 - 1.2 mg/dL   GFR calc non Af Amer >90  >90 mL/min   GFR calc Af Amer >90  >90 mL/min  CBG MONITORING, ED     Status: Abnormal   Collection Time    06/18/13  3:42 AM      Result Value Ref Range   Glucose-Capillary 428 (*) 70 - 99 mg/dL   Comment 1 Documented in Chart     Comment 2 Notify RN    I-STAT VENOUS BLOOD GAS, ED     Status: Abnormal   Collection Time    06/18/13  4:22 AM      Result Value Ref Range   pH, Ven 7.435 (*) 7.250 - 7.300   pCO2, Ven 29.4 (*) 45.0 - 50.0 mmHg   pO2, Ven 63.0 (*) 30.0 - 45.0 mmHg   Bicarbonate 19.7 (*) 20.0 - 24.0 mEq/L   TCO2 21  0 - 100 mmol/L   O2 Saturation 93.0     Acid-base deficit 3.0 (*) 0.0 - 2.0 mmol/L   Sample type VENOUS      MDM    Patient with DKA secondary to medication non-compliance. We have treated with IVF and insulin gtt. Glucose improved. Case discussed with Dr. Hal Hope who has accepted the patient to the Tele Unit.   CRITICAL CARE Performed by: Elyn Peers   Total critical care time: 4m  Critical care time was exclusive of separately billable procedures and treating other patients.  Critical care was necessary to treat or prevent imminent or life-threatening deterioration.  Critical care was time spent personally by me on the following activities: development of treatment plan with patient and/or surrogate as well as nursing, discussions with consultants, evaluation of patient's response to treatment, examination of patient, obtaining history from patient or surrogate, ordering and performing treatments and interventions, ordering and review of laboratory studies, ordering and review of radiographic studies, pulse oximetry and re-evaluation of patient's condition.  Elyn Peers, MD 06/18/13 [phone]

## 2013-06-18 NOTE — ED Notes (Signed)
Pt up to  Bathe given soap etc.  Pt peed on floor missed pan states thatshe is having peroid pt requesting more mesh panties and food

## 2013-06-18 NOTE — Progress Notes (Signed)
Patient does not want to be asked admission questions at this time.  Will try another time.

## 2013-06-18 NOTE — ED Notes (Signed)
Pt keeps bending left arm and cutting off IV pt [id number] requesting food

## 2013-06-18 NOTE — ED Notes (Signed)
Pt cont to sip contrast states taste awful

## 2013-06-18 NOTE — ED Notes (Signed)
glucomander paused till pt returns  From CT

## 2013-06-18 NOTE — Progress Notes (Signed)
ANTIBIOTIC CONSULT NOTE - INITIAL  Pharmacy Consult for Zosyn Indication: Pyelonephritis  Allergies  Allergen Reactions  . Metformin And Related Other (See Comments)    Upset stomach    Patient Measurements: Height: 5\' 2"  (157.5 cm) Weight: 174 lb 2.6 oz (79 kg) IBW/kg (Calculated) : 50.1  Vital Signs: Temp: 97.8 F (36.6 C) (04/08 1306) Temp src: Oral (04/08 1306) BP: 94/51 mmHg (04/08 1400) Pulse Rate: 78 (04/08 1400) Intake/Output from previous day:   Intake/Output from this shift:    Labs:  Recent Labs  06/17/13 2305 06/18/13 1211  WBC 9.4  --   HGB 11.6*  --   PLT 253  --   CREATININE 0.51 0.45*   Estimated Creatinine Clearance: 80.1 ml/min (by C-G formula based on Cr of 0.45). No results found for this basename: VANCOTROUGH, VANCOPEAK, VANCORANDOM, GENTTROUGH, GENTPEAK, GENTRANDOM, TOBRATROUGH, TOBRAPEAK, TOBRARND, AMIKACINPEAK, AMIKACINTROU, AMIKACIN,  in the last 72 hours   Microbiology: No results found for this or any previous visit (from the past 720 hour(s)).  Medical History: Past Medical History  Diagnosis Date  . Hypertension   . Diabetes mellitus     Assessment: 53 y.o. F who [facility] to the [facility] on 4/8 with abdominal pain. UA noted to be dirty - cultures are pending. Pharmacy was consulted to start Zosyn for empiric pyelonephritis coverage.   Goal of Therapy:  Proper antibiotics for infection/cultures adjusted for renal/hepatic function   Plan:  1. Zosyn 3.375g IV every 8 hours 2. Will continue to follow renal function, culture results, LOT, and antibiotic de-escalation plans   Alycia Rossetti, PharmD, BCPS Clinical Pharmacist Pager: [phone] 06/18/2013 3:08 PM

## 2013-06-18 NOTE — Progress Notes (Signed)
Patient seen and examined. Admitted after midnight secondary to abd pain, nausea, vomiting and DKA. Patient abd CT demonstrating focal pyelonephritis and also cholelithiasis w/o cholecystitis. For further info/details referred to Dr. Moise Boring H&P.   Plan: -now that her bicarb is 22, anion gap is close and CBG's has been < 200 for 4 consecutives episodes will transition off insulin drip and cover with SSI and lantus -will check a1C -start patient on zosyn due to pyelonephritis -follow renal US -advance diet to full liquid  Barton Dubois [phone]

## 2013-06-19 DIAGNOSIS — IMO0001 Reserved for inherently not codable concepts without codable children: Secondary | ICD-10-CM

## 2013-06-19 DIAGNOSIS — E1165 Type 2 diabetes mellitus with hyperglycemia: Secondary | ICD-10-CM

## 2013-06-19 LAB — BASIC METABOLIC PANEL
BUN: 7 mg/dL (ref 6–23)
CHLORIDE: 94 meq/L — AB (ref 96–112)
CO2: 19 meq/L (ref 19–32)
CREATININE: 0.53 mg/dL (ref 0.50–1.10)
Calcium: 8.8 mg/dL (ref 8.4–10.5)
GFR calc Af Amer: >90 mL/min (ref >90)
GFR calc non Af Amer: >90 mL/min (ref >90)
GLUCOSE: 408 mg/dL — AB (ref 70–99)
POTASSIUM: 4.1 meq/L (ref 3.7–5.3)
Sodium: 130 mEq/L — ABNORMAL LOW (ref 137–147)

## 2013-06-19 LAB — CBC
HEMATOCRIT: 34.2 % — AB (ref 36.0–46.0)
HEMOGLOBIN: 11.5 g/dL — AB (ref 12.0–15.0)
MCH: 29.7 pg (ref 26.0–34.0)
MCHC: 33.6 g/dL (ref 30.0–36.0)
MCV: 88.4 fL (ref 78.0–100.0)
Platelets: 318 10*3/uL (ref 150–400)
RBC: 3.87 MIL/uL (ref 3.87–5.11)
RDW: 13.7 % (ref 11.5–15.5)
WBC: 10.2 10*3/uL (ref 4.0–10.5)

## 2013-06-19 LAB — GLUCOSE, CAPILLARY
GLUCOSE-CAPILLARY: 291 mg/dL — AB (ref 70–99)
GLUCOSE-CAPILLARY: 355 mg/dL — AB (ref 70–99)
GLUCOSE-CAPILLARY: 235 mg/dL — AB (ref 70–99)
Glucose-Capillary: 362 mg/dL — ABNORMAL HIGH (ref 70–99)

## 2013-06-19 MED ORDER — HYOSCYAMINE SULFATE 0.125 MG PO TABS
0.1250 mg | ORAL_TABLET | Freq: Three times a day (TID) | ORAL | Status: DC
  Filled 2013-06-19 (×3): qty 1

## 2013-06-19 MED ORDER — INSULIN GLARGINE 100 UNIT/ML ~~LOC~~ SOLN
15.0000 [IU] | Freq: Two times a day (BID) | SUBCUTANEOUS | Status: DC
  Administered 2013-06-19 – 2013-06-20 (×2): 15 [IU] via SUBCUTANEOUS
  Filled 2013-06-19 (×3): qty 0.15

## 2013-06-19 MED ORDER — INSULIN GLARGINE 100 UNIT/ML ~~LOC~~ SOLN
12.0000 [IU] | Freq: Two times a day (BID) | SUBCUTANEOUS | Status: DC
  Administered 2013-06-19: 12 [IU] via SUBCUTANEOUS
  Filled 2013-06-19 (×2): qty 0.12

## 2013-06-19 MED ORDER — HYOSCYAMINE SULFATE 0.125 MG SL SUBL
0.1250 mg | SUBLINGUAL_TABLET | Freq: Three times a day (TID) | SUBLINGUAL | Status: DC
  Administered 2013-06-19 – 2013-06-20 (×5): 0.125 mg via SUBLINGUAL
  Filled 2013-06-19 (×9): qty 1

## 2013-06-19 NOTE — Plan of Care (Signed)
Problem: Limited Adherence to Nutrition-Related Recommendations (NB-1.6) Goal: Nutrition education Formal process to instruct or train a patient/client in a skill or to impart knowledge to help patients/clients voluntarily manage or modify food choices and eating behavior to maintain or improve health. Outcome: Completed/Met Date Met:  06/19/13  RD consulted for nutrition education regarding diabetes.     Lab Results  Component Value Date    HGBA1C 15.1* 06/18/2013    Patient reports to RD she is currently homeless and has been under a significant amount of stress.  Also states she's had limited access to food and/or healthy choices for her diabetes.  RD provided "Carbohydrate Counting for People with Diabetes" handout from the Academy of [employer]etics. Discussed different food groups and their effects on blood sugar, emphasizing carbohydrate-containing foods. Provided list of carbohydrates and recommended serving sizes of common foods.  Discussed importance of controlled and consistent carbohydrate intake throughout the day. Provided examples of ways to balance meals/snacks and encouraged intake of high-fiber, whole grain complex carbohydrates. Teach back method used.  Expect fair to poor compliance.  Body mass index is 31.85 kg/(m^2). Pt meets criteria for Obesity Class I based on current BMI.  Current diet order is Carbohydrate Modified, patient is consuming approximately 95-100% of meals at this time. Labs and medications reviewed. No further nutrition interventions warranted at this time. If additional nutrition issues arise, please re-consult RD.  Arthur Holms, RD, LDN Pager #: [phone] After-Hours Pager #: [phone]

## 2013-06-19 NOTE — Progress Notes (Signed)
[facility] PROGRESS NOTE  Gina [name] [id number] DOB: 1960-06-18 DOA: 06/18/2013 PCP: Angelica Chessman, MD  Assessment/Plan: 1-uncontrolled diabetes and DKA: now resolved. -currently adjusting insulin for better control of CBG's -continue modified carb diet -given financial problems will discharge on 70/30 -diabetes coordinator contacted to assist with education and dosing. -A1C 15.1  2-HTN: stable and well controlled currently.  -patient to continue low sodium diet -will hold on any antihypertensive currently  3-leukocytosis and pyelonephritis: continue zosyn. -no fever, WBC's trending down -will start levsin for abd cramps  4-DVT: SCD's  Code Status: Full Family Communication: no family at bedside Disposition Plan: to be determine   Consultants:  None   Procedures:  See below for x-ray reports  Antibiotics:  Zosyn   HPI/Subjective: Feeling better, no nausea, no vomiting and able to tolerate modified carb diet. Still with some abd cramps, afebrile  Objective: Filed Vitals:   06/19/13 0819  BP: 117/83  Pulse: 67  Temp: 97.8 F (36.6 C)  Resp: 20    Intake/Output Summary (Last 24 hours) at 06/19/13 1014 Last data filed at 06/19/13 0400  Gross per 24 hour  Intake   1000 ml  Output    300 ml  Net    700 ml   Filed Weights   06/18/13 1500 06/19/13 0422  Weight: 79 kg (174 lb 2.6 oz) 79 kg (174 lb 2.6 oz)    Exam:   General:  Feeling better; no N/V; reports still some cramping abd pain  Cardiovascular: S1 and S2, no rubs or gallops  Respiratory: CTA bilaterally  Abdomen: diffuse tenderness, no distension, soft, no guarding; positive BS  Musculoskeletal: no edema, no cyanosis  Data Reviewed: Basic Metabolic Panel:  Recent Labs Lab 06/17/13 2305 06/18/13 1211 06/18/13 1601  NA 131* 138 135*  K 5.2 3.7 4.0  CL 93* 102 99  CO2 17* 22 17*  GLUCOSE 552* 96 156*  BUN 13 7 7   CREATININE 0.51 0.45* 0.43*  CALCIUM 9.0 8.5  8.4   Liver Function Tests:  Recent Labs Lab 06/17/13 2305 06/18/13 1211  AST 15 13  ALT 17 16  ALKPHOS 75 68  BILITOT 0.4 0.3  PROT 7.6 6.7  ALBUMIN 2.8* 2.5*    Recent Labs Lab 06/18/13 1858  LIPASE 24   CBC:  Recent Labs Lab 06/17/13 2305 06/18/13 1858  WBC 9.4 12.0*  NEUTROABS 6.9 8.0*  HGB 11.6* 11.4*  HCT 33.8* 33.6*  MCV 88.7 89.4  PLT 253 268   Cardiac Enzymes:  Recent Labs Lab 06/18/13 1211  TROPONINI <0.30   CBG:  Recent Labs Lab 06/18/13 1614 06/18/13 1731 06/18/13 1915 06/18/13 2215 06/19/13 0818  GLUCAP 139* 155* 226* 324* 291*    Recent Results (from the past 240 hour(s))  MRSA PCR SCREENING     Status: None   Collection Time    06/18/13  3:37 PM      Result Value Ref Range Status   MRSA by PCR NEGATIVE  NEGATIVE Final   Comment:            The GeneXpert MRSA Assay (FDA     approved for NASAL specimens     only), is one component of a     comprehensive MRSA colonization     surveillance program. It is not     intended to diagnose MRSA     infection nor to guide or     monitor treatment for     MRSA infections.  Studies: US Abdomen Complete  06/18/2013   CLINICAL DATA:  Followup abnormal CT scan.  EXAM: ULTRASOUND ABDOMEN COMPLETE  COMPARISON:  CT scan, same date.  FINDINGS: Gallbladder:  There is a 1.5 cm echogenic gallstones in the gallbladder with associated acoustic shadowing. No gallbladder wall thickening or pericholecystic fluid to suggest acute cholecystitis. Negative sonographic Murphy sign.  Common bile duct:  Diameter: 6.0 mm.  Liver:  Normal echogenicity without focal lesion or biliary dilatation.  IVC:  Normal caliber.  Pancreas:  Sonographically unremarkable.  Spleen:  Normal size and echogenicity without focal lesions.  Right Kidney:  Length: 12.2 cm. Normal renal cortical thickness and echogenicity without definite focal lesion. I can not identified the abnormality that was seen on the CT scan but the CT scan is  convincing for focal pyelonephritis. No hydronephrosis.  Left Kidney:  Length: 10.3 cm. Normal renal cortical thickness and echogenicity without focal lesions or hydronephrosis.  Abdominal aorta:  Normal caliber.  Other findings:  None.  IMPRESSION: 1. Cholelithiasis without sonographic findings for acute cholecystitis. 2. Normal caliber common bile duct for age. 3. The right kidney abnormality is not well demonstrated on ultrasound but is convincing for focal pyelonephritis on the CT scan. 4. No pancreatic or peripancreatic process is identified. On the CT scan it appears there is inflammation involving the duodenum and some fluid. This is likely due to duodenitis or peptic ulcer disease.   Electronically Signed   By: Kalman Jewels M.D.   On: 06/18/2013 16:58   Ct Abdomen Pelvis W Contrast  06/18/2013   ADDENDUM REPORT: 06/18/2013 12:12  ADDENDUM: These findings were discussed by me by telephone with Dr. Carmelina Noun at 12:10 pm on 18 June 2013.   Electronically Signed   By: David  Martinique   On: 06/18/2013 12:12   06/18/2013   CLINICAL DATA:  Nausea and vomiting and lower abdominal pain for 5 days; history of diabetes.  EXAM: CT ABDOMEN AND PELVIS WITH CONTRAST  TECHNIQUE: Multidetector CT imaging of the abdomen and pelvis was performed using the standard protocol following bolus administration of intravenous contrast.  CONTRAST:  77mL OMNIPAQUE IOHEXOL 300 MG/ML SOLN intravenously. The patient also received oral contrast material.  COMPARISON:  None.  FINDINGS: The liver exhibits no focal mass or ductal dilation. The gallbladder is adequately distended and contains at least 2 calcified stones measuring up to 4 mm in diameter. There is subjective mild gallbladder wall thickening. Mildly increased density in the adjacent periduodenal fat is demonstrated but is nonspecific. There is no evidence of duodenal ulceration or periduodenal abscess. The pancreas, spleen, adrenal glands, and kidneys exhibit no acute  abnormalities. The caliber of the abdominal aorta is normal. The psoas musculature is normal in appearance. There is no free fluid in the abdomen or pelvis.  In the medial aspect of the midpole of the right kidney there is a focal area of decreased density demonstrated to better advantage on the delayed images. It measures 2.5 cm in diameter. There is slightly decreased enhancement of the surrounding cortex as well. There are no calcified stones nor hydronephrosis. The left kidney is normal in appearance. The perinephric fat is normal in density. The partially distended urinary bladder is normal. The uterus and adnexal structures exhibit no acute abnormalities.  The partially contrast filled loops of small and large bowel exhibit no evidence of ileus nor of obstruction. The appendix is not discretely demonstrate a. there is no evidence of colitis or diverticulitis. There is no inguinal or umbilical hernia.  The lumbar spine and bony pelvis exhibit no acute abnormalities. The lung bases are clear.  IMPRESSION: 1. There is abnormal hypodensity in the lower pole cortex medially of the right kidney. This may reflect focal pyelonephritis or early renal abscess formation. The appearance does not suggest a mass. There is no evidence of hydronephrosis. The left kidney is normal in appearance. 2. There are gallstones present. There is subjective minimal gallbladder wall thickening. There is mildly increased soft tissue density within the periduodenal fat without evidence of a discrete abscess or ulcer. Correlation with the patient's clinical and laboratory values is needed. Gallbladder ultrasound may be useful. 3. There is no acute bowel abnormality. 4. No intra-abdominal or pelvic abscess or free fluid is demonstrated. 5. These results will be called to the ordering clinician or representative by the Radiologist Assistant, and communication documented in the PACS Dashboard.  Electronically Signed: By: David  Martinique On:  06/18/2013 11:48    Scheduled Meds: . hyoscyamine  0.125 mg Oral TID  . insulin aspart  0-15 Units Subcutaneous TID WC  . insulin aspart  0-5 Units Subcutaneous QHS  . insulin glargine  12 Units Subcutaneous BID  . piperacillin-tazobactam (ZOSYN)  IV  3.375 g Intravenous Q8H   Continuous Infusions:   Principal Problem:   DKA (diabetic ketoacidoses) Active Problems:   Anemia   Abdominal pain   HTN (hypertension)    Time spent: >30 minutes    [place] [facility] Pager [phone]. If 7PM-7AM, please contact night-coverage at [email or web address], password [facility] 06/19/2013, 10:14 AM  LOS: 1 day

## 2013-06-19 NOTE — Progress Notes (Signed)
Inpatient Diabetes Program Recommendations  AACE/ADA: New Consensus Statement on Inpatient Glycemic Control (2013)  Target Ranges:  Prepandial:   less than 140 mg/dL      Peak postprandial:   less than 180 mg/dL (1-2 hours)      Critically ill patients:  140 - 180 mg/dL   Reason for Visit: Diabetes Coordinator consult   Diabetes history: Doesn;t know how long she has had it. Outpatient Diabetes medications: not taking Levemir because of financial issues Current orders for Inpatient glycemic control: Lantus 12 units BID, Novolog MODERATE correction scale TID & HS   Note: Admitted with DKA.  HgbA1C is 15.1%. Spoke with patient.  Has not been taking insulin as prescribed for awhile due to financial issues.  States that she was to take Levemir and Metformin.  No PCP.  Suggested that she could start on cheaper kind of insulin from [place]. Per consult, recommend starting 70/30 mixed insulin 15 units BID which would be close to the recommended dosage for the Lantus 12 units BID that has been ordered.  Recommend starting 70/30 insulin 15 units at supper time tonight and see how dosage works for her blood sugars.  Continue Novolog MODERATE correction scale AC & HS. Patient needs to eat at least 50% of meals to get the 70/30 mixed insulin.  May need to titrate insulin dosage as needed. Dietician in to see patient.  Will continue to follow while in hospital.  Harvel Ricks RN BSN CDE
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
## 2013-06-20 LAB — BASIC METABOLIC PANEL
BUN: 6 mg/dL (ref 6–23)
CALCIUM: 8.7 mg/dL (ref 8.4–10.5)
CO2: 23 meq/L (ref 19–32)
CREATININE: 0.5 mg/dL (ref 0.50–1.10)
Chloride: 99 mEq/L (ref 96–112)
GFR calc Af Amer: >90 mL/min (ref >90)
GLUCOSE: 140 mg/dL — AB (ref 70–99)
Potassium: 3.8 mEq/L (ref 3.7–5.3)
Sodium: 135 mEq/L — ABNORMAL LOW (ref 137–147)

## 2013-06-20 LAB — GLUCOSE, CAPILLARY
Glucose-Capillary: 307 mg/dL — ABNORMAL HIGH (ref 70–99)
Glucose-Capillary: 145 mg/dL — ABNORMAL HIGH (ref 70–99)
Glucose-Capillary: 211 mg/dL — ABNORMAL HIGH (ref 70–99)
Glucose-Capillary: 99 mg/dL (ref 70–99)

## 2013-06-20 MED ORDER — LEVOFLOXACIN 750 MG PO TABS
750.0000 mg | ORAL_TABLET | Freq: Every day | ORAL | Status: DC
  Filled 2013-06-20: qty 1

## 2013-06-20 MED ORDER — INSULIN ASPART PROT & ASPART (70-30 MIX) 100 UNIT/ML ~~LOC~~ SUSP: 20.0000 [IU] | Freq: Two times a day (BID) | SUBCUTANEOUS | Status: DC

## 2013-06-20 MED ORDER — INSULIN ASPART PROT & ASPART (70-30 MIX) 100 UNIT/ML ~~LOC~~ SUSP
25.0000 [IU] | Freq: Two times a day (BID) | SUBCUTANEOUS | Status: DC
  Administered 2013-06-20 – 2013-06-21 (×2): 25 [IU] via SUBCUTANEOUS
  Filled 2013-06-20: qty 10

## 2013-06-20 NOTE — Clinical Documentation Improvement (Signed)
Possible Clinical Conditions?   Hyponatremia -monitored & resolved                                Other Condition___________________                 Cannot Clinically Determine_________   Supporting Clinical Indicators:  Patient's Sodium level range 06/19/13 - 06/17/13  (130 - 138)   Thank You, Serena Colonel ,RN Clinical Documentation Specialist:  [place] Information Management

## 2013-06-20 NOTE — Progress Notes (Signed)
[facility] PROGRESS NOTE  Gina [name] [id number] DOB: 1960-12-17 DOA: 06/18/2013 PCP: Angelica Chessman, MD  Assessment/Plan: 1-uncontrolled diabetes and DKA: now resolved. -DKA resolved. -currently adjusting insulin for better control of CBG's -continue modified carb diet -given financial problems will discharge on 70/30 (dose 25 BID) -diabetes coordinator contacted to assist with education and dosing. -A1C 15.1  2-HTN: stable and well controlled currently.  -patient to continue low sodium diet -will hold on any antihypertensive currently  3-leukocytosis and pyelonephritis: continue zosyn. -no fever, WBC's trending down -will continue levsin for abd cramps -plan is to switch to levaquin in am  4-abd cramps/GERD: continue PPI and levsin  DVT: SCD's  Code Status: Full Family Communication: no family at bedside Disposition Plan: to be determine   Consultants:  None   Procedures:  See below for x-ray reports  Antibiotics:  Zosyn 4/8--4/10  HPI/Subjective: Feeling better, no nausea, no vomiting and able to tolerate modified carb diet. Still with slight abd pain, but much improved. No fever  Objective: Filed Vitals:   06/20/13 1308  BP: 101/67  Pulse: 79  Temp: 97.1 F (36.2 C)  Resp: 14    Intake/Output Summary (Last 24 hours) at 06/20/13 1319 Last data filed at 06/19/13 2318  Gross per 24 hour  Intake    340 ml  Output    300 ml  Net     40 ml   Filed Weights   06/18/13 1500 06/19/13 0422 06/20/13 0435  Weight: 79 kg (174 lb 2.6 oz) 79 kg (174 lb 2.6 oz) 75.1 kg (165 lb 9.1 oz)    Exam:   General:  Feeling better; no N/V; reports abd pain is pretty much resolved  Cardiovascular: S1 and S2, no rubs or gallops  Respiratory: CTA bilaterally  Abdomen: slight tenderness with deep palpation, mainly in mid abdomen, no distension, soft, no guarding; positive BS  Musculoskeletal: no edema, no cyanosis  Data Reviewed: Basic  Metabolic Panel:  Recent Labs Lab 06/17/13 2305 06/18/13 1211 06/18/13 1601 06/19/13 1017 06/20/13 1005  NA 131* 138 135* 130* 135*  K 5.2 3.7 4.0 4.1 3.8  CL 93* 102 99 94* 99  CO2 17* 22 17* 19 23  GLUCOSE 552* 96 156* 408* 140*  BUN 13 7 7 7 6   CREATININE 0.51 0.45* 0.43* 0.53 0.50  CALCIUM 9.0 8.5 8.4 8.8 8.7   Liver Function Tests:  Recent Labs Lab 06/17/13 2305 06/18/13 1211  AST 15 13  ALT 17 16  ALKPHOS 75 68  BILITOT 0.4 0.3  PROT 7.6 6.7  ALBUMIN 2.8* 2.5*    Recent Labs Lab 06/18/13 1858  LIPASE 24   CBC:  Recent Labs Lab 06/17/13 2305 06/18/13 1858 06/19/13 1017  WBC 9.4 12.0* 10.2  NEUTROABS 6.9 8.0*  --   HGB 11.6* 11.4* 11.5*  HCT 33.8* 33.6* 34.2*  MCV 88.7 89.4 88.4  PLT 253 268 318   Cardiac Enzymes:  Recent Labs Lab 06/18/13 1211  TROPONINI <0.30   CBG:  Recent Labs Lab 06/19/13 1232 06/19/13 1717 06/19/13 2115 06/20/13 0750 06/20/13 1256  GLUCAP 362* 355* 235* 307* 145*    Recent Results (from the past 240 hour(s))  MRSA PCR SCREENING     Status: None   Collection Time    06/18/13  3:37 PM      Result Value Ref Range Status   MRSA by PCR NEGATIVE  NEGATIVE Final   Comment:            The  GeneXpert MRSA Assay (FDA     approved for NASAL specimens     only), is one component of a     comprehensive MRSA colonization     surveillance program. It is not     intended to diagnose MRSA     infection nor to guide or     monitor treatment for     MRSA infections.     Studies: US Abdomen Complete  06/18/2013   CLINICAL DATA:  Followup abnormal CT scan.  EXAM: ULTRASOUND ABDOMEN COMPLETE  COMPARISON:  CT scan, same date.  FINDINGS: Gallbladder:  There is a 1.5 cm echogenic gallstones in the gallbladder with associated acoustic shadowing. No gallbladder wall thickening or pericholecystic fluid to suggest acute cholecystitis. Negative sonographic Murphy sign.  Common bile duct:  Diameter: 6.0 mm.  Liver:  Normal  echogenicity without focal lesion or biliary dilatation.  IVC:  Normal caliber.  Pancreas:  Sonographically unremarkable.  Spleen:  Normal size and echogenicity without focal lesions.  Right Kidney:  Length: 12.2 cm. Normal renal cortical thickness and echogenicity without definite focal lesion. I can not identified the abnormality that was seen on the CT scan but the CT scan is convincing for focal pyelonephritis. No hydronephrosis.  Left Kidney:  Length: 10.3 cm. Normal renal cortical thickness and echogenicity without focal lesions or hydronephrosis.  Abdominal aorta:  Normal caliber.  Other findings:  None.  IMPRESSION: 1. Cholelithiasis without sonographic findings for acute cholecystitis. 2. Normal caliber common bile duct for age. 3. The right kidney abnormality is not well demonstrated on ultrasound but is convincing for focal pyelonephritis on the CT scan. 4. No pancreatic or peripancreatic process is identified. On the CT scan it appears there is inflammation involving the duodenum and some fluid. This is likely due to duodenitis or peptic ulcer disease.   Electronically Signed   By: Kalman Jewels M.D.   On: 06/18/2013 16:58    Scheduled Meds: . hyoscyamine  0.125 mg Sublingual TID  . insulin aspart  0-15 Units Subcutaneous TID WC  . insulin aspart  0-5 Units Subcutaneous QHS  . insulin aspart protamine- aspart  25 Units Subcutaneous BID WC  . [START ON 06/21/2013] levofloxacin  750 mg Oral Daily  . piperacillin-tazobactam (ZOSYN)  IV  3.375 g Intravenous Q8H   Continuous Infusions:   Principal Problem:   DKA (diabetic ketoacidoses) Active Problems:   Anemia   Abdominal pain   HTN (hypertension)    Time spent: >30 minutes    [place] [facility] Pager [phone]. If 7PM-7AM, please contact night-coverage at [email or web address], password [facility] 06/20/2013, 1:19 PM  LOS: 2 days
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
## 2013-06-20 NOTE — Progress Notes (Signed)
Results for JAZMIN, CRESPIN (MRN [id number]) as of 06/20/2013 09:26  Ref. Range 06/19/2013 08:18 06/19/2013 12:32 06/19/2013 17:17 06/19/2013 21:15 06/20/2013 07:50  Glucose-Capillary Latest Range: 70-99 mg/dL 291 (H) 362 (H) 355 (H) 235 (H) 307 (H)   CBGs continue to be greater than 180 mg/dl.  Currently on Lantus 15 units BID.  Patient will need to be on 70/30 mixed insulin at discharge due to financial issues.   If starting on 70/30 insulin, the dosage equal to the Lantus order would be a total of 55 units per day of mixed insulin.  The patient would receive 70/30 insulin 25 units at breakfast and 25 units at supper.  Patient needs to be eating at least 50 % of meals in order to take 70/30 mixed insulin.  Recommend starting this insulin while in the hospital before discharge. Will continue to follow while in hospital.  Harvel Ricks RN BSN CDE

## 2013-06-21 DIAGNOSIS — K219 Gastro-esophageal reflux disease without esophagitis: Secondary | ICD-10-CM

## 2013-06-21 LAB — BASIC METABOLIC PANEL
BUN: 12 mg/dL (ref 6–23)
CALCIUM: 8.7 mg/dL (ref 8.4–10.5)
CO2: 23 meq/L (ref 19–32)
CREATININE: 0.55 mg/dL (ref 0.50–1.10)
Chloride: 99 mEq/L (ref 96–112)
GFR calc Af Amer: >90 mL/min (ref >90)
GFR calc non Af Amer: >90 mL/min (ref >90)
Glucose, Bld: 126 mg/dL — ABNORMAL HIGH (ref 70–99)
Potassium: 4.1 mEq/L (ref 3.7–5.3)
Sodium: 137 mEq/L (ref 137–147)

## 2013-06-21 LAB — GLUCOSE, CAPILLARY: Glucose-Capillary: 128 mg/dL — ABNORMAL HIGH (ref 70–99)

## 2013-06-21 MED ORDER — GLUCOSE BLOOD VI STRP: ORAL_STRIP | Status: DC

## 2013-06-21 MED ORDER — LANCETS 30G MISC: Status: DC

## 2013-06-21 MED ORDER — "SYRINGE/NEEDLE (DISP) 29G X 1/2"" 1 ML MISC": Status: DC

## 2013-06-21 MED ORDER — FREESTYLE SYSTEM KIT: PACK | Status: DC

## 2013-06-21 MED ORDER — FAMOTIDINE 40 MG PO TABS: 40.0000 mg | ORAL_TABLET | Freq: Every day | ORAL | Status: DC

## 2013-06-21 MED ORDER — INSULIN ASPART PROT & ASPART (70-30 MIX) 100 UNIT/ML ~~LOC~~ SUSP: 26.0000 [IU] | Freq: Two times a day (BID) | SUBCUTANEOUS | Status: DC

## 2013-06-21 MED ORDER — LEVOFLOXACIN 750 MG PO TABS: 750.0000 mg | ORAL_TABLET | Freq: Every day | ORAL | Status: AC

## 2013-06-21 NOTE — Discharge Summary (Signed)
Physician Discharge Summary  Gina [name] RCB:[id number] DOB: Jan 04, 1961 DOA: 06/18/2013  PCP: Angelica Chessman, MD  Admit date: 06/18/2013 Discharge date: 06/21/2013  Time spent: >30 minutes  Recommendations for Outpatient Follow-up:  1. Repeat BMET to follow electrolytes and renal function 2. Repeat CBC to follow WBC's trend 3. Reassess BP and start antihypertensive regimen if needed (preferably ACE/ARB given DM) 4. Close follow up to CBG's and diabetes, with further adjustment to hypoglycemic regimen as needed   Discharge Diagnoses:  DKA (diabetic ketoacidoses) Uncontrolled diabetes type (insulin dependent) Abdominal pain HTN (hypertension) GERD  Discharge Condition: stable and improved. Discharged home with instruction to follow with PCP in 10 days.  Diet recommendation: low sodium diet and low carbohydrates diet  Filed Weights   06/18/13 1500 06/19/13 0422 06/20/13 0435  Weight: 79 kg (174 lb 2.6 oz) 79 kg (174 lb 2.6 oz) 75.1 kg (165 lb 9.1 oz)    History of present illness:  53 y.o. female with history of diabetes mellitus, hypertension, presents to the ER because of abdominal pain. Patient has been having generalized abdominal pain colicky in nature with nausea vomiting and denies any diarrhea. Patient states she has been having these symptoms for last 3 days. Denies any fever chills or use of any antibiotics or any recent travel. In the ER patient's labs reveal patient is in DKA. Patient states she has not taken her medications for last few months due to financial issues. Patient otherwise denies any chest pain shortness of breath focal deficits headache or any visual symptoms. Patient has been started on IV insulin and fluids. UA is pending. Patient's last menstrual cycle but denies any vaginal discharge.   Hospital Course:  1-uncontrolled diabetes and DKA: now resolved.  -DKA resolved.  -Patient advise to follow low carbohydrates diet and to follow closely with PCP  for further adjustments to her hypoglycemic regimen  -given financial problems will discharge on 70/30 (dose 26 units BID)  -diabetes coordinator contacted during admission for teaching points, instruction adn assistance with insulin dose -A1C 15.1   2-HTN: stable and well controlled currently.  -patient to continue low sodium diet  -will hold on any antihypertensive currently  -reassess during follow up visit and start antihypertensive regimen if needed  3-leukocytosis and pyelonephritis: afebrile, no WBC's and currently w/o abd pain or dysuria.  -will discharge on levaquin daily for 5 more days to complete antibiotic therapy  4-abd cramps/GERD: continue PPI  -no further abd pain present  Procedures:  See below for x-ray reports   Consultations:  Diabetes coordinator  CM/SW  Discharge Exam: Filed Vitals:   06/21/13 0500  BP: 109/82  Pulse: 79  Temp: 98 F (36.7 C)  Resp: 18   General: Feeling better; no N/V; reports abd pain is resolved  Cardiovascular: S1 and S2, no rubs or gallops  Respiratory: CTA bilaterally  Abdomen: no abd pain, no distension, soft, no guarding; positive BS  Musculoskeletal: no edema, no cyanosis   Discharge Instructions You were cared for by a hospitalist during your hospital stay. If you have any questions about your discharge medications or the care you received while you were in the hospital after you are discharged, you can call the unit and asked to speak with the hospitalist on call if the hospitalist that took care of you is not available. Once you are discharged, your primary care physician will handle any further medical issues. Please note that NO REFILLS for any discharge medications will be authorized once you are discharged,  as it is imperative that you return to your primary care physician (or establish a relationship with a primary care physician if you do not have one) for your aftercare needs so that they can reassess your need for  medications and monitor your lab values.  Discharge Orders   Future Appointments Provider Department Dept Phone   07/28/2013 3:30 PM [facility] [phone]   07/28/2013 4:15 PM Angelica Chessman, MD [place] [phone]   Future Orders Complete By Expires   Diet - low sodium heart healthy  As directed    Discharge instructions  As directed        Medication List         famotidine 40 MG tablet  Commonly known as:  PEPCID  Take 1 tablet (40 mg total) by mouth daily.     glucose blood test strip  Use strip three times a day to check blood sugar.     glucose monitoring kit monitoring kit  Use glucometer to check sugar three times a day     insulin aspart protamine- aspart (70-30) 100 UNIT/ML injection  Commonly known as:  NOVOLOG MIX 70/30  Inject 0.26 mLs (26 Units total) into the skin 2 (two) times daily with a meal.     Lancets 30G Misc  Use to check blood sugar three times a day     levofloxacin 750 MG tablet  Commonly known as:  LEVAQUIN  Take 1 tablet (750 mg total) by mouth daily.     Syringe/Needle (Disp) 29G X 1/2" 1 ML Misc  Commonly known as:  PRODIGY SAFETY SYRINGES  Use syringe to inject insulin twice a day       Allergies  Allergen Reactions  . Metformin And Related Other (See Comments)    Upset stomach       Follow-up Information   Follow up with [facility]     In 2 weeks.   Contact information:   [address] [place] [address] [phone]       The results of significant diagnostics from this hospitalization (including imaging, microbiology, ancillary and laboratory) are listed below for reference.    Significant Diagnostic Studies: US Abdomen Complete  06/18/2013   CLINICAL DATA:  Followup abnormal CT scan.  EXAM: ULTRASOUND ABDOMEN COMPLETE  COMPARISON:  CT scan, same date.  FINDINGS: Gallbladder:  There  is a 1.5 cm echogenic gallstones in the gallbladder with associated acoustic shadowing. No gallbladder wall thickening or pericholecystic fluid to suggest acute cholecystitis. Negative sonographic Murphy sign.  Common bile duct:  Diameter: 6.0 mm.  Liver:  Normal echogenicity without focal lesion or biliary dilatation.  IVC:  Normal caliber.  Pancreas:  Sonographically unremarkable.  Spleen:  Normal size and echogenicity without focal lesions.  Right Kidney:  Length: 12.2 cm. Normal renal cortical thickness and echogenicity without definite focal lesion. I can not identified the abnormality that was seen on the CT scan but the CT scan is convincing for focal pyelonephritis. No hydronephrosis.  Left Kidney:  Length: 10.3 cm. Normal renal cortical thickness and echogenicity without focal lesions or hydronephrosis.  Abdominal aorta:  Normal caliber.  Other findings:  None.  IMPRESSION: 1. Cholelithiasis without sonographic findings for acute cholecystitis. 2. Normal caliber common bile duct for age. 3. The right kidney abnormality is not well demonstrated on ultrasound but is convincing for focal pyelonephritis on the CT scan. 4. No pancreatic  or peripancreatic process is identified. On the CT scan it appears there is inflammation involving the duodenum and some fluid. This is likely due to duodenitis or peptic ulcer disease.   Electronically Signed   By: Kalman Jewels M.D.   On: 06/18/2013 16:58   Ct Abdomen Pelvis W Contrast  06/18/2013   ADDENDUM REPORT: 06/18/2013 12:12  ADDENDUM: These findings were discussed by me by telephone with Dr. Carmelina Noun at 12:10 pm on 18 June 2013.   Electronically Signed   By: David  Martinique   On: 06/18/2013 12:12   06/18/2013   CLINICAL DATA:  Nausea and vomiting and lower abdominal pain for 5 days; history of diabetes.  EXAM: CT ABDOMEN AND PELVIS WITH CONTRAST  TECHNIQUE: Multidetector CT imaging of the abdomen and pelvis was performed using the standard protocol following bolus  administration of intravenous contrast.  CONTRAST:  34m OMNIPAQUE IOHEXOL 300 MG/ML SOLN intravenously. The patient also received oral contrast material.  COMPARISON:  None.  FINDINGS: The liver exhibits no focal mass or ductal dilation. The gallbladder is adequately distended and contains at least 2 calcified stones measuring up to 4 mm in diameter. There is subjective mild gallbladder wall thickening. Mildly increased density in the adjacent periduodenal fat is demonstrated but is nonspecific. There is no evidence of duodenal ulceration or periduodenal abscess. The pancreas, spleen, adrenal glands, and kidneys exhibit no acute abnormalities. The caliber of the abdominal aorta is normal. The psoas musculature is normal in appearance. There is no free fluid in the abdomen or pelvis.  In the medial aspect of the midpole of the right kidney there is a focal area of decreased density demonstrated to better advantage on the delayed images. It measures 2.5 cm in diameter. There is slightly decreased enhancement of the surrounding cortex as well. There are no calcified stones nor hydronephrosis. The left kidney is normal in appearance. The perinephric fat is normal in density. The partially distended urinary bladder is normal. The uterus and adnexal structures exhibit no acute abnormalities.  The partially contrast filled loops of small and large bowel exhibit no evidence of ileus nor of obstruction. The appendix is not discretely demonstrate a. there is no evidence of colitis or diverticulitis. There is no inguinal or umbilical hernia.  The lumbar spine and bony pelvis exhibit no acute abnormalities. The lung bases are clear.  IMPRESSION: 1. There is abnormal hypodensity in the lower pole cortex medially of the right kidney. This may reflect focal pyelonephritis or early renal abscess formation. The appearance does not suggest a mass. There is no evidence of hydronephrosis. The left kidney is normal in appearance. 2.  There are gallstones present. There is subjective minimal gallbladder wall thickening. There is mildly increased soft tissue density within the periduodenal fat without evidence of a discrete abscess or ulcer. Correlation with the patient's clinical and laboratory values is needed. Gallbladder ultrasound may be useful. 3. There is no acute bowel abnormality. 4. No intra-abdominal or pelvic abscess or free fluid is demonstrated. 5. These results will be called to the ordering clinician or representative by the Radiologist Assistant, and communication documented in the PACS Dashboard.  Electronically Signed: By: David  JMartiniqueOn: 06/18/2013 11:48    Microbiology: Recent Results (from the past 240 hour(s))  MRSA PCR SCREENING     Status: None   Collection Time    06/18/13  3:37 PM      Result Value Ref Range Status   MRSA by PCR NEGATIVE  NEGATIVE Final  Comment:            The GeneXpert MRSA Assay (FDA     approved for NASAL specimens     only), is one component of a     comprehensive MRSA colonization     surveillance program. It is not     intended to diagnose MRSA     infection nor to guide or     monitor treatment for     MRSA infections.     Labs: Basic Metabolic Panel:  Recent Labs Lab 06/18/13 1211 06/18/13 1601 06/19/13 1017 06/20/13 1005 06/21/13 0456  NA 138 135* 130* 135* 137  K 3.7 4.0 4.1 3.8 4.1  CL 102 99 94* 99 99  CO2 22 17* _0 GLUCOSE 96 156* 408* 140* 126*  BUN _1 CREATININE 0.45* 0.43* 0.53 0.50 0.55  CALCIUM 8.5 8.4 8.8 8.7 8.7   Liver Function Tests:  Recent Labs Lab 06/17/13 2305 06/18/13 1211  AST 15 13  ALT 17 16  ALKPHOS 75 68  BILITOT 0.4 0.3  PROT 7.6 6.7  ALBUMIN 2.8* 2.5*    Recent Labs Lab 06/18/13 1858  LIPASE 24   CBC:  Recent Labs Lab 06/17/13 2305 06/18/13 1858 06/19/13 1017  WBC 9.4 12.0* 10.2  NEUTROABS 6.9 8.0*  --   HGB 11.6* 11.4* 11.5*  HCT 33.8* 33.6* 34.2*  MCV 88.7 89.4 88.4  PLT 253 268  318   Cardiac Enzymes:  Recent Labs Lab 06/18/13 1211  TROPONINI <0.30   CBG:  Recent Labs Lab 06/20/13 0750 06/20/13 1256 06/20/13 1556 06/20/13 2110 06/21/13 0746  GLUCAP 307* 145* 211* 99 128*    Signed:  Barton Dubois  [facility] 06/21/2013, 8:58 AM

## 2013-07-28 ENCOUNTER — Ambulatory Visit: Payer: Self-pay | Attending: Internal Medicine

## 2013-07-28 ENCOUNTER — Ambulatory Visit: Payer: Self-pay | Attending: Internal Medicine | Admitting: Internal Medicine

## 2013-07-28 ENCOUNTER — Encounter: Payer: Self-pay | Admitting: Internal Medicine

## 2013-07-28 VITALS — BP 155/87 | HR 67 | Temp 98.8°F | Resp 16 | Ht 62.0 in | Wt 170.0 lb

## 2013-07-28 DIAGNOSIS — E119 Type 2 diabetes mellitus without complications: Secondary | ICD-10-CM | POA: Insufficient documentation

## 2013-07-28 DIAGNOSIS — K8020 Calculus of gallbladder without cholecystitis without obstruction: Secondary | ICD-10-CM | POA: Insufficient documentation

## 2013-07-28 DIAGNOSIS — Z794 Long term (current) use of insulin: Secondary | ICD-10-CM | POA: Insufficient documentation

## 2013-07-28 DIAGNOSIS — E1159 Type 2 diabetes mellitus with other circulatory complications: Secondary | ICD-10-CM | POA: Insufficient documentation

## 2013-07-28 DIAGNOSIS — I10 Essential (primary) hypertension: Secondary | ICD-10-CM | POA: Insufficient documentation

## 2013-07-28 DIAGNOSIS — R609 Edema, unspecified: Secondary | ICD-10-CM | POA: Insufficient documentation

## 2013-07-28 LAB — GLUCOSE, POCT (MANUAL RESULT ENTRY): POC Glucose: 140 mg/dl — AB (ref 70–99)

## 2013-07-28 MED ORDER — METFORMIN HCL 1000 MG PO TABS: 1000.0000 mg | ORAL_TABLET | Freq: Two times a day (BID) | ORAL | Status: DC

## 2013-07-28 MED ORDER — FAMOTIDINE 40 MG PO TABS: 40.0000 mg | ORAL_TABLET | Freq: Every day | ORAL | Status: DC

## 2013-07-28 MED ORDER — LANCETS 30G MISC: Status: DC

## 2013-07-28 MED ORDER — LISINOPRIL 20 MG PO TABS: 20.0000 mg | ORAL_TABLET | Freq: Every day | ORAL | Status: DC

## 2013-07-28 MED ORDER — GLUCOSE BLOOD VI STRP: ORAL_STRIP | Status: DC

## 2013-07-28 MED ORDER — "SYRINGE/NEEDLE (DISP) 29G X 1/2"" 1 ML MISC": Status: DC

## 2013-07-28 MED ORDER — INSULIN ASPART PROT & ASPART (70-30 MIX) 100 UNIT/ML ~~LOC~~ SUSP: 26.0000 [IU] | Freq: Two times a day (BID) | SUBCUTANEOUS | Status: DC

## 2013-07-28 NOTE — Progress Notes (Signed)
Patient ID: Gina [name], female   DOB: 1960/08/12, 53 y.o.   MRN: [id number]   Gina [name], is a 53 y.o. female  AJG:[id number]  BTD:[id number]  DOB - 1960/03/31  CC:  Chief Complaint  Patient presents with  . Follow-up       HPI: Gina [name] is a 53 y.o. female here today to establish medical care. Patient is known to have hypertension and diabetes, recently admitted to the hospital for DKA. She was managed appropriately and discharged to be followed up in our clinic today. Her hemoglobin A1c in the hospital was 15%, She is now on insulin 70/30 26 units twice a day and metformin 1000 mg tablet by mouth twice a day. She uses lisinopril 20 mg tablet by mouth daily for hypertension and famotidine for her reflux. She does not have these medications because of lack of financial resources to buy them. She has no complaint today except that her legs continue to swell, she denies any shortness of breath, she denies any cough, she denies paroxysmal nocturnal dyspnea, no orthopnea. She does not smoke cigarette she does not drink alcohol. While in the hospital she was also found to have cholelithiasis, and pyelonephritis. She was treated with antibiotics. She has no urinary symptoms today, no abdominal pain. Patient has No headache, No chest pain, No abdominal pain - No Nausea, No new weakness tingling or numbness, No Cough - SOB.  Allergies  Allergen Reactions  . Metformin And Related Other (See Comments)    Upset stomach   Past Medical History  Diagnosis Date  . Hypertension   . Diabetes mellitus    Current Outpatient Prescriptions on File Prior to Visit  Medication Sig Dispense Refill  . glucose monitoring kit (FREESTYLE) monitoring kit Use glucometer to check sugar three times a day  1 each  0   No current facility-administered medications on file prior to visit.   Family History  Problem Relation Age of Onset  . Diabetes type II Father   . Diabetes Mellitus II Brother   . CAD  Father   . Prostate cancer Father    History   Social History  . Marital Status: Single    Spouse Name: N/A    Number of Children: N/A  . Years of Education: N/A   Occupational History  . Not on file.   Social History Main Topics  . Smoking status: Never Smoker   . Smokeless tobacco: Not on file  . Alcohol Use: No     Comment: occasionally  . Drug Use: No  . Sexual Activity: Not on file   Other Topics Concern  . Not on file   Social History Narrative  . No narrative on file    Review of Systems: Constitutional: Negative for fever, chills, diaphoresis, activity change, appetite change and fatigue. HENT: Negative for ear pain, nosebleeds, congestion, facial swelling, rhinorrhea, neck pain, neck stiffness and ear discharge.  Eyes: Negative for pain, discharge, redness, itching and visual disturbance. Respiratory: Negative for cough, choking, chest tightness, shortness of breath, wheezing and stridor.  Cardiovascular: Negative for chest pain, palpitations and leg swelling. Gastrointestinal: Negative for abdominal distention. Genitourinary: Negative for dysuria, urgency, frequency, hematuria, flank pain, decreased urine volume, difficulty urinating and dyspareunia.  Musculoskeletal: Negative for back pain, joint swelling, arthralgia and gait problem. Neurological: Negative for dizziness, tremors, seizures, syncope, facial asymmetry, speech difficulty, weakness, light-headedness, numbness and headaches.  Hematological: Negative for adenopathy. Does not bruise/bleed easily. Psychiatric/Behavioral: Negative for hallucinations, behavioral problems, confusion, dysphoric  mood, decreased concentration and agitation.    Objective:   Filed Vitals:   07/28/13 1629  BP: 155/87  Pulse: 67  Temp: 98.8 F (37.1 C)  Resp: 16    Physical Exam: Constitutional: Patient appears well-developed and well-nourished. No distress. HENT: Normocephalic, atraumatic, External right and left ear  normal. Oropharynx is clear and moist.  Eyes: Conjunctivae and EOM are normal. PERRLA, no scleral icterus. Neck: Normal ROM. Neck supple. No JVD. No tracheal deviation. No thyromegaly. CVS: RRR, S1/S2 +, no murmurs, no gallops, no carotid bruit.  Pulmonary: Effort and breath sounds normal, no stridor, rhonchi, wheezes, rales.  Abdominal: Soft. BS +, no distension, tenderness, rebound or guarding.  Musculoskeletal: Normal range of motion. Bilateral pitting pedal edema up to the knee  Lymphadenopathy: No lymphadenopathy noted, cervical, inguinal or axillary Neuro: Alert. Normal reflexes, muscle tone coordination. No cranial nerve deficit. Skin: Skin is warm and dry. No rash noted. Not diaphoretic. No erythema. No pallor. Psychiatric: Normal mood and affect. Behavior, judgment, thought content normal.  Lab Results  Component Value Date   WBC 10.2 06/19/2013   HGB 11.5* 06/19/2013   HCT 34.2* 06/19/2013   MCV 88.4 06/19/2013   PLT 318 06/19/2013   Lab Results  Component Value Date   CREATININE 0.55 06/21/2013   BUN 12 06/21/2013   NA 137 06/21/2013   K 4.1 06/21/2013   CL 99 06/21/2013   CO2 23 06/21/2013    Lab Results  Component Value Date   HGBA1C 15.1* 06/18/2013   Lipid Panel     Component Value Date/Time   CHOL 165 06/18/2013 1211   TRIG 109 06/18/2013 1211   HDL 23* 06/18/2013 1211   CHOLHDL 7.2 06/18/2013 1211   VLDL 22 06/18/2013 1211   LDLCALC 120* 06/18/2013 1211       Assessment and plan:   1. Diabetes  - Glucose (CBG) Refill - metFORMIN (GLUCOPHAGE) 1000 MG tablet; Take 1 tablet (1,000 mg total) by mouth 2 (two) times daily with a meal.  Dispense: 180 tablet; Refill: 3 - insulin aspart protamine- aspart (NOVOLOG MIX 70/30) (70-30) 100 UNIT/ML injection; Inject 0.26 mLs (26 Units total) into the skin 2 (two) times daily with a meal.  Dispense: 3 vial; Refill: 3 - Lancets 30G MISC; Use to check blood sugar three times a day  Dispense: 100 each; Refill: 6 - Syringe/Needle, Disp,  (PRODIGY SAFETY SYRINGES) 29G X 1/2" 1 ML MISC; Use syringe to inject insulin twice a day  Dispense: 100 each; Refill: 6 - glucose blood test strip; Use strip three times a day to check blood sugar.  Dispense: 100 each; Refill: 6   She has been given an appointment with our diabetic educator in the clinic  2. HTN (hypertension) Refill - famotidine (PEPCID) 40 MG tablet; Take 1 tablet (40 mg total) by mouth daily.  Dispense: 90 tablet; Refill: 3 - lisinopril (PRINIVIL,ZESTRIL) 20 MG tablet; Take 1 tablet (20 mg total) by mouth daily.  Dispense: 90 tablet; Refill: 3 DASH diet  For bilateral pedal edema on the background of uncontrolled hypertension, will order 2-D echo She may need diuretic, will start after reconfirming the kidney function and echocardiogram  - 2D Echocardiogram with contrast; Future  3. Cholelithiases Stable  Patient was counseled extensively about nutrition and exercise  Return in about 2 months (around 09/27/2013), or if symptoms worsen or fail to improve, for Hemoglobin A1C and Follow up, DM, Follow up HTN.  The patient was given clear instructions to go  to ER or return to medical center if symptoms don't improve, worsen or new problems develop. The patient verbalized understanding. The patient was told to call to get lab results if they haven't heard anything in the next week.     This note has been created with Surveyor, quantity. Any transcriptional errors are unintentional.    Angelica Chessman, MD, MHA, [facility], [place], [place]   07/28/2013, 5:46 PM

## 2013-07-28 NOTE — Progress Notes (Signed)
Pt is here to establish care. Pt has a history of HTN and diabetes. Pt has questions about the swelling of her legs.

## 2013-07-28 NOTE — Patient Instructions (Signed)
Diabetes Meal Planning Guide The diabetes meal planning guide is a tool to help you plan your meals and snacks. It is important for people with diabetes to manage their blood glucose (sugar) levels. Choosing the right foods and the right amounts throughout your day will help control your blood glucose. Eating right can even help you improve your blood pressure and reach or maintain a healthy weight. CARBOHYDRATE COUNTING MADE EASY When you eat carbohydrates, they turn to sugar. This raises your blood glucose level. Counting carbohydrates can help you control this level so you feel better. When you plan your meals by counting carbohydrates, you can have more flexibility in what you eat and balance your medicine with your food intake. Carbohydrate counting simply means adding up the total amount of carbohydrate grams in your meals and snacks. Try to eat about the same amount at each meal. Foods with carbohydrates are listed below. Each portion below is 1 carbohydrate serving or 15 grams of carbohydrates. Ask your dietician how many grams of carbohydrates you should eat at each meal or snack. Grains and Starches  1 slice bread.   English muffin or hotdog/hamburger bun.   cup cold cereal (unsweetened).   cup cooked pasta or rice.   cup starchy vegetables (corn, potatoes, peas, beans, winter squash).  1 tortilla (6 inches).   bagel.  1 waffle or pancake (size of a CD).   cup cooked cereal.  4 to 6 small crackers. *Whole grain is recommended. Fruit  1 cup fresh unsweetened berries, melon, papaya, pineapple.  1 small fresh fruit.   banana or mango.   cup fruit juice (4 oz unsweetened).   cup canned fruit in natural juice or water.  2 tbs dried fruit.  12 to 15 grapes or cherries. Milk and Yogurt  1 cup fat-free or 1% milk.  1 cup soy milk.  6 oz light yogurt with sugar-free sweetener.  6 oz low-fat soy yogurt.  6 oz plain yogurt. Vegetables  1 cup raw or  cup  cooked is counted as 0 carbohydrates or a "free" food.  If you eat 3 or more servings at 1 meal, count them as 1 carbohydrate serving. Other Carbohydrates   oz chips or pretzels.   cup ice cream or frozen yogurt.   cup sherbet or sorbet.  2 inch square cake, no frosting.  1 tbs honey, sugar, jam, jelly, or syrup.  2 small cookies.  3 squares of graham crackers.  3 cups popcorn.  6 crackers.  1 cup broth-based soup.  Count 1 cup casserole or other mixed foods as 2 carbohydrate servings.  Foods with less than 20 calories in a serving may be counted as 0 carbohydrates or a "free" food. You may want to purchase a book or computer software that lists the carbohydrate gram counts of different foods. In addition, the nutrition facts panel on the labels of the foods you eat are a good source of this information. The label will tell you how big the serving size is and the total number of carbohydrate grams you will be eating per serving. Divide this number by 15 to obtain the number of carbohydrate servings in a portion. Remember, 1 carbohydrate serving equals 15 grams of carbohydrate. SERVING SIZES Measuring foods and serving sizes helps you make sure you are getting the right amount of food. The list below tells how big or small some common serving sizes are.  1 oz.........4 stacked dice.  3 oz.........Deck of cards.  1 tsp........Tip   of little finger.  1 tbs........Thumb.  2 tbs........Golf ball.   cup.......Half of a fist.  1 cup........A fist. SAMPLE DIABETES MEAL PLAN Below is a sample meal plan that includes foods from the grain and starches, dairy, vegetable, fruit, and meat groups. A dietician can individualize a meal plan to fit your calorie needs and tell you the number of servings needed from each food group. However, controlling the total amount of carbohydrates in your meal or snack is more important than making sure you include all of the food groups at every  meal. You may interchange carbohydrate containing foods (dairy, starches, and fruits). The meal plan below is an example of a 2000 calorie diet using carbohydrate counting. This meal plan has 17 carbohydrate servings. Breakfast  1 cup oatmeal (2 carb servings).   cup light yogurt (1 carb serving).  1 cup blueberries (1 carb serving).   cup almonds. Snack  1 large apple (2 carb servings).  1 low-fat string cheese stick. Lunch  Chicken breast salad.  1 cup spinach.   cup chopped tomatoes.  2 oz chicken breast, sliced.  2 tbs low-fat Italian dressing.  12 whole-wheat crackers (2 carb servings).  12 to 15 grapes (1 carb serving).  1 cup low-fat milk (1 carb serving). Snack  1 cup carrots.   cup hummus (1 carb serving). Dinner  3 oz broiled salmon.  1 cup brown rice (3 carb servings). Snack  1  cups steamed broccoli (1 carb serving) drizzled with 1 tsp olive oil and lemon juice.  1 cup light pudding (2 carb servings). DIABETES MEAL PLANNING WORKSHEET Your dietician can use this worksheet to help you decide how many servings of foods and what types of foods are right for you.  BREAKFAST Food Group and Servings / Carb Servings Grain/Starches __________________________________ Dairy __________________________________________ Vegetable ______________________________________ Fruit ___________________________________________ Meat __________________________________________ Fat ____________________________________________ LUNCH Food Group and Servings / Carb Servings Grain/Starches ___________________________________ Dairy ___________________________________________ Fruit ____________________________________________ Meat ___________________________________________ Fat _____________________________________________ DINNER Food Group and Servings / Carb Servings Grain/Starches ___________________________________ Dairy  ___________________________________________ Fruit ____________________________________________ Meat ___________________________________________ Fat _____________________________________________ SNACKS Food Group and Servings / Carb Servings Grain/Starches ___________________________________ Dairy ___________________________________________ Vegetable _______________________________________ Fruit ____________________________________________ Meat ___________________________________________ Fat _____________________________________________ DAILY TOTALS Starches _________________________ Vegetable ________________________ Fruit ____________________________ Dairy ____________________________ Meat ____________________________ Fat ______________________________ Document Released: 11/24/2004 Document Revised: 05/22/2011 Document Reviewed: 10/05/2008 [employer] Patient Information 2014 [employer]. DASH Diet The DASH diet stands for "Dietary Approaches to Stop Hypertension." It is a healthy eating plan that has been shown to reduce high blood pressure (hypertension) in as little as 14 days, while also possibly providing other significant health benefits. These other health benefits include reducing the risk of breast cancer after menopause and reducing the risk of type 2 diabetes, heart disease, colon cancer, and stroke. Health benefits also include weight loss and slowing kidney failure in patients with chronic kidney disease.  DIET GUIDELINES  Limit salt (sodium). Your diet should contain less than 1500 mg of sodium daily.  Limit refined or processed carbohydrates. Your diet should include mostly whole grains. Desserts and added sugars should be used sparingly.  Include small amounts of heart-healthy fats. These types of fats include nuts, oils, and tub margarine. Limit saturated and trans fats. These fats have been shown to be harmful in the body. CHOOSING FOODS  The following food groups  are based on a 2000 calorie diet. See your Registered Dietitian for individual calorie needs. Grains and Grain Products (6 to 8 servings daily)  Eat More Often:   Whole-wheat bread, brown rice, whole-grain or wheat pasta, quinoa, popcorn without added fat or salt (air popped).  Eat Less Often: White bread, white pasta, white rice, cornbread. Vegetables (4 to 5 servings daily)  Eat More Often: Fresh, frozen, and canned vegetables. Vegetables may be raw, steamed, roasted, or grilled with a minimal amount of fat.  Eat Less Often/Avoid: Creamed or fried vegetables. Vegetables in a cheese sauce. Fruit (4 to 5 servings daily)  Eat More Often: All fresh, canned (in natural juice), or frozen fruits. Dried fruits without added sugar. One hundred percent fruit juice ( cup [237 mL] daily).  Eat Less Often: Dried fruits with added sugar. Canned fruit in light or heavy syrup. [employer], Fish, and Poultry (2 servings or less daily. One serving is 3 to 4 oz [85-114 g]).  Eat More Often: Ninety percent or leaner ground beef, tenderloin, sirloin. Round cuts of beef, chicken breast, [place] breast. All fish. Grill, bake, or broil your meat. Nothing should be fried.  Eat Less Often/Avoid: Fatty cuts of meat, [place], or chicken leg, thigh, or wing. Fried cuts of meat or fish. Dairy (2 to 3 servings)  Eat More Often: Low-fat or fat-free milk, low-fat plain or light yogurt, reduced-fat or part-skim cheese.  Eat Less Often/Avoid: Milk (whole, 2%).Whole milk yogurt. Full-fat cheeses. Nuts, Seeds, and Legumes (4 to 5 servings per week)  Eat More Often: All without added salt.  Eat Less Often/Avoid: Salted nuts and seeds, canned beans with added salt. Fats and Sweets (limited)  Eat More Often: Vegetable oils, tub margarines without trans fats, sugar-free gelatin. Mayonnaise and salad dressings.  Eat Less Often/Avoid: Coconut oils, palm oils, butter, stick margarine, cream, half and half, cookies, candy,  pie. FOR MORE INFORMATION The Dash Diet Eating Plan: www.dashdiet.org Document Released: 02/16/2011 Document Revised: 05/22/2011 Document Reviewed: 02/16/2011 [facility] Patient Information 2014 [place], [place]. Hypertension As your heart beats, it forces blood through your arteries. This force is your blood pressure. If the pressure is too high, it is called hypertension (HTN) or high blood pressure. HTN is dangerous because you may have it and not know it. High blood pressure may mean that your heart has to work harder to pump blood. Your arteries may be narrow or stiff. The extra work puts you at risk for heart disease, stroke, and other problems.  Blood pressure consists of two numbers, a higher number over a lower, 110/72, for example. It is stated as "110 over 72." The ideal is below 120 for the top number (systolic) and under 80 for the bottom (diastolic). Write down your blood pressure today. You should pay close attention to your blood pressure if you have certain conditions such as:  Heart failure.  Prior heart attack.  Diabetes  Chronic kidney disease.  Prior stroke.  Multiple risk factors for heart disease. To see if you have HTN, your blood pressure should be measured while you are seated with your arm held at the level of the heart. It should be measured at least twice. A one-time elevated blood pressure reading (especially in the Emergency Department) does not mean that you need treatment. There may be conditions in which the blood pressure is different between your right and left arms. It is important to see your caregiver soon for a recheck. Most people have essential hypertension which means that there is not a specific cause. This type of high blood pressure may be lowered by changing lifestyle factors such as:  Stress.  Smoking.  Lack of  exercise.  Excessive weight.  Drug/tobacco/alcohol use.  Eating less salt. Most people do not have symptoms from high blood  pressure until it has caused damage to the body. Effective treatment can often prevent, delay or reduce that damage. TREATMENT  When a cause has been identified, treatment for high blood pressure is directed at the cause. There are a large number of medications to treat HTN. These fall into several categories, and your caregiver will help you select the medicines that are best for you. Medications may have side effects. You should review side effects with your caregiver. If your blood pressure stays high after you have made lifestyle changes or started on medicines,   Your medication(s) may need to be changed.  Other problems may need to be addressed.  Be certain you understand your prescriptions, and know how and when to take your medicine.  Be sure to follow up with your caregiver within the time frame advised (usually within two weeks) to have your blood pressure rechecked and to review your medications.  If you are taking more than one medicine to lower your blood pressure, make sure you know how and at what times they should be taken. Taking two medicines at the same time can result in blood pressure that is too low. SEEK IMMEDIATE MEDICAL CARE IF:  You develop a severe headache, blurred or changing vision, or confusion.  You have unusual weakness or numbness, or a faint feeling.  You have severe chest or abdominal pain, vomiting, or breathing problems. MAKE SURE YOU:   Understand these instructions.  Will watch your condition.  Will get help right away if you are not doing well or get worse. Document Released: 02/27/2005 Document Revised: 05/22/2011 Document Reviewed: 10/18/2007 [facility] Patient Information 2014 [place]. Diabetes and Exercise Exercising regularly is important. It is not just about losing weight. It has many health benefits, such as:  Improving your overall fitness, flexibility, and endurance.  Increasing your bone density.  Helping with weight  control.  Decreasing your body fat.  Increasing your muscle strength.  Reducing stress and tension.  Improving your overall health. People with diabetes who exercise gain additional benefits because exercise:  Reduces appetite.  Improves the body's use of blood sugar (glucose).  Helps lower or control blood glucose.  Decreases blood pressure.  Helps control blood lipids (such as cholesterol and triglycerides).  Improves the body's use of the hormone insulin by:  Increasing the body's insulin sensitivity.  Reducing the body's insulin needs.  Decreases the risk for heart disease because exercising:  Lowers cholesterol and triglycerides levels.  Increases the levels of good cholesterol (such as high-density lipoproteins [HDL]) in the body.  Lowers blood glucose levels. YOUR ACTIVITY PLAN  Choose an activity that you enjoy and set realistic goals. Your health care provider or diabetes educator can help you make an activity plan that works for you. You can break activities into 2 or 3 sessions throughout the day. Doing so is as good as one long session. Exercise ideas include:  Taking the dog for a walk.  Taking the stairs instead of the elevator.  Dancing to your favorite song.  Doing your favorite exercise with a friend. RECOMMENDATIONS FOR EXERCISING WITH TYPE 1 OR TYPE 2 DIABETES   Check your blood glucose before exercising. If blood glucose levels are greater than 240 mg/dL, check for urine ketones. Do not exercise if ketones are present.  Avoid injecting insulin into areas of the body that are going to  be exercised. For example, avoid injecting insulin into:  The arms when playing tennis.  The legs when jogging.  Keep a record of:  Food intake before and after you exercise.  Expected peak times of insulin action.  Blood glucose levels before and after you exercise.  The type and amount of exercise you have done.  Review your records with your health  care provider. Your health care provider will help you to develop guidelines for adjusting food intake and insulin amounts before and after exercising.  If you take insulin or oral hypoglycemic agents, watch for signs and symptoms of hypoglycemia. They include:  Dizziness.  Shaking.  Sweating.  Chills.  Confusion.  Drink plenty of water while you exercise to prevent dehydration or heat stroke. Body water is lost during exercise and must be replaced.  Talk to your health care provider before starting an exercise program to make sure it is safe for you. Remember, almost any type of activity is better than none. Document Released: 05/20/2003 Document Revised: 10/30/2012 Document Reviewed: 08/06/2012 [facility] Patient Information 2014 [place].

## 2013-08-06 ENCOUNTER — Ambulatory Visit: Payer: Self-pay | Admitting: Pharmacist

## 2013-08-12 ENCOUNTER — Ambulatory Visit (HOSPITAL_COMMUNITY): Payer: Self-pay | Attending: Internal Medicine

## 2013-09-25 ENCOUNTER — Ambulatory Visit: Payer: Self-pay | Admitting: Internal Medicine

## 2014-01-22 ENCOUNTER — Ambulatory Visit: Payer: Self-pay | Admitting: Internal Medicine

## 2014-02-17 ENCOUNTER — Encounter: Payer: Self-pay | Admitting: Internal Medicine

## 2014-02-17 ENCOUNTER — Ambulatory Visit: Payer: No Typology Code available for payment source | Attending: Internal Medicine | Admitting: Internal Medicine

## 2014-02-17 VITALS — BP 141/87 | HR 82 | Temp 98.2°F | Resp 16 | Ht 62.0 in | Wt 170.0 lb

## 2014-02-17 DIAGNOSIS — Z1239 Encounter for other screening for malignant neoplasm of breast: Secondary | ICD-10-CM | POA: Insufficient documentation

## 2014-02-17 DIAGNOSIS — E119 Type 2 diabetes mellitus without complications: Secondary | ICD-10-CM

## 2014-02-17 DIAGNOSIS — Z1211 Encounter for screening for malignant neoplasm of colon: Secondary | ICD-10-CM | POA: Insufficient documentation

## 2014-02-17 DIAGNOSIS — Z794 Long term (current) use of insulin: Secondary | ICD-10-CM | POA: Insufficient documentation

## 2014-02-17 DIAGNOSIS — E1165 Type 2 diabetes mellitus with hyperglycemia: Secondary | ICD-10-CM | POA: Insufficient documentation

## 2014-02-17 DIAGNOSIS — I10 Essential (primary) hypertension: Secondary | ICD-10-CM | POA: Insufficient documentation

## 2014-02-17 LAB — POCT GLYCOSYLATED HEMOGLOBIN (HGB A1C): Hemoglobin A1C: 14.9

## 2014-02-17 LAB — GLUCOSE, POCT (MANUAL RESULT ENTRY): POC Glucose: 350 mg/dl — AB (ref 70–99)

## 2014-02-17 MED ORDER — FAMOTIDINE 40 MG PO TABS: 40.0000 mg | ORAL_TABLET | Freq: Every day | ORAL | Status: DC

## 2014-02-17 MED ORDER — "SYRINGE/NEEDLE (DISP) 29G X 1/2"" 1 ML MISC": Status: DC

## 2014-02-17 MED ORDER — FREESTYLE SYSTEM KIT: PACK | Status: DC

## 2014-02-17 MED ORDER — GLUCOSE BLOOD VI STRP: ORAL_STRIP | Status: DC

## 2014-02-17 MED ORDER — INSULIN ASPART 100 UNIT/ML ~~LOC~~ SOLN
20.0000 [IU] | Freq: Once | SUBCUTANEOUS | Status: AC
  Administered 2014-02-17: 20 [IU] via SUBCUTANEOUS

## 2014-02-17 MED ORDER — LISINOPRIL 20 MG PO TABS: 20.0000 mg | ORAL_TABLET | Freq: Every day | ORAL | Status: DC

## 2014-02-17 MED ORDER — METFORMIN HCL 1000 MG PO TABS: 1000.0000 mg | ORAL_TABLET | Freq: Two times a day (BID) | ORAL | Status: DC

## 2014-02-17 MED ORDER — LANCETS 30G MISC: Status: DC

## 2014-02-17 MED ORDER — INSULIN ASPART PROT & ASPART (70-30 MIX) 100 UNIT/ML ~~LOC~~ SUSP: 26.0000 [IU] | Freq: Two times a day (BID) | SUBCUTANEOUS | Status: DC

## 2014-02-17 NOTE — Patient Instructions (Signed)
DASH Eating Plan DASH stands for "Dietary Approaches to Stop Hypertension." The DASH eating plan is a healthy eating plan that has been shown to reduce high blood pressure (hypertension). Additional health benefits may include reducing the risk of type 2 diabetes mellitus, heart disease, and stroke. The DASH eating plan may also help with weight loss. WHAT DO I NEED TO KNOW ABOUT THE DASH EATING PLAN? For the DASH eating plan, you will follow these general guidelines:  Choose foods with a percent daily value for sodium of less than 5% (as listed on the food label).  Use salt-free seasonings or herbs instead of table salt or sea salt.  Check with your health care provider or pharmacist before using salt substitutes.  Eat lower-sodium products, often labeled as "lower sodium" or "no salt added."  Eat fresh foods.  Eat more vegetables, fruits, and low-fat dairy products.  Choose whole grains. Look for the word "whole" as the first word in the ingredient list.  Choose fish and skinless chicken or turkey more often than red meat. Limit fish, poultry, and meat to 6 oz (170 g) each day.  Limit sweets, desserts, sugars, and sugary drinks.  Choose heart-healthy fats.  Limit cheese to 1 oz (28 g) per day.  Eat more home-cooked food and less restaurant, buffet, and fast food.  Limit fried foods.  Cook foods using methods other than frying.  Limit canned vegetables. If you do use them, rinse them well to decrease the sodium.  When eating at a restaurant, ask that your food be prepared with less salt, or no salt if possible. WHAT FOODS CAN I EAT? Seek help from a dietitian for individual calorie needs. Grains Whole grain or whole wheat bread. Brown rice. Whole grain or whole wheat pasta. Quinoa, bulgur, and whole grain cereals. Low-sodium cereals. Corn or whole wheat flour tortillas. Whole grain cornbread. Whole grain crackers. Low-sodium crackers. Vegetables Fresh or frozen vegetables  (raw, steamed, roasted, or grilled). Low-sodium or reduced-sodium tomato and vegetable juices. Low-sodium or reduced-sodium tomato sauce and paste. Low-sodium or reduced-sodium canned vegetables.  Fruits All fresh, canned (in natural juice), or frozen fruits. Meat and Other Protein Products Ground beef (85% or leaner), grass-fed beef, or beef trimmed of fat. Skinless chicken or turkey. Ground chicken or turkey. Pork trimmed of fat. All fish and seafood. Eggs. Dried beans, peas, or lentils. Unsalted nuts and seeds. Unsalted canned beans. Dairy Low-fat dairy products, such as skim or 1% milk, 2% or reduced-fat cheeses, low-fat ricotta or cottage cheese, or plain low-fat yogurt. Low-sodium or reduced-sodium cheeses. Fats and Oils Tub margarines without trans fats. Light or reduced-fat mayonnaise and salad dressings (reduced sodium). Avocado. Safflower, olive, or canola oils. Natural peanut or almond butter. Other Unsalted popcorn and pretzels. The items listed above may not be a complete list of recommended foods or beverages. Contact your dietitian for more options. WHAT FOODS ARE NOT RECOMMENDED? Grains White bread. White pasta. White rice. Refined cornbread. Bagels and croissants. Crackers that contain trans fat. Vegetables Creamed or fried vegetables. Vegetables in a cheese sauce. Regular canned vegetables. Regular canned tomato sauce and paste. Regular tomato and vegetable juices. Fruits Dried fruits. Canned fruit in light or heavy syrup. Fruit juice. Meat and Other Protein Products Fatty cuts of meat. Ribs, chicken wings, bacon, sausage, bologna, salami, chitterlings, fatback, hot dogs, bratwurst, and packaged luncheon meats. Salted nuts and seeds. Canned beans with salt. Dairy Whole or 2% milk, cream, half-and-half, and cream cheese. Whole-fat or sweetened yogurt. Full-fat   cheeses or blue cheese. Nondairy creamers and whipped toppings. Processed cheese, cheese spreads, or cheese  curds. Condiments Onion and garlic salt, seasoned salt, table salt, and sea salt. Canned and packaged gravies. Worcestershire sauce. Tartar sauce. Barbecue sauce. Teriyaki sauce. Soy sauce, including reduced sodium. Steak sauce. Fish sauce. Oyster sauce. Cocktail sauce. Horseradish. Ketchup and mustard. Meat flavorings and tenderizers. Bouillon cubes. Hot sauce. Tabasco sauce. Marinades. Taco seasonings. Relishes. Fats and Oils Butter, stick margarine, lard, shortening, ghee, and bacon fat. Coconut, palm kernel, or palm oils. Regular salad dressings. Other Pickles and olives. Salted popcorn and pretzels. The items listed above may not be a complete list of foods and beverages to avoid. Contact your dietitian for more information. WHERE CAN I FIND MORE INFORMATION? National Heart, Lung, and Blood Institute: travelstabloid.com Document Released: 02/16/2011 Document Revised: 07/14/2013 Document Reviewed: 01/01/2013 [facility] Patient Information 2015 [place], [place]. This information is not intended to replace advice given to you by your health care provider. Make sure you discuss any questions you have with your health care provider. Hypertension Hypertension, commonly called high blood pressure, is when the force of blood pumping through your arteries is too strong. Your arteries are the blood vessels that carry blood from your heart throughout your body. A blood pressure reading consists of a higher number over a lower number, such as 110/72. The higher number (systolic) is the pressure inside your arteries when your heart pumps. The lower number (diastolic) is the pressure inside your arteries when your heart relaxes. Ideally you want your blood pressure below 120/80. Hypertension forces your heart to work harder to pump blood. Your arteries may become narrow or stiff. Having hypertension puts you at risk for heart disease, stroke, and other problems.  RISK  FACTORS Some risk factors for high blood pressure are controllable. Others are not.  Risk factors you cannot control include:   Race. You may be at higher risk if you are African American.  Age. Risk increases with age.  Gender. Men are at higher risk than women before age 37 years. After age 55, women are at higher risk than men. Risk factors you can control include:  Not getting enough exercise or physical activity.  Being overweight.  Getting too much fat, sugar, calories, or salt in your diet.  Drinking too much alcohol. SIGNS AND SYMPTOMS Hypertension does not usually cause signs or symptoms. Extremely high blood pressure (hypertensive crisis) may cause headache, anxiety, shortness of breath, and nosebleed. DIAGNOSIS  To check if you have hypertension, your health care provider will measure your blood pressure while you are seated, with your arm held at the level of your heart. It should be measured at least twice using the same arm. Certain conditions can cause a difference in blood pressure between your right and left arms. A blood pressure reading that is higher than normal on one occasion does not mean that you need treatment. If one blood pressure reading is high, ask your health care provider about having it checked again. TREATMENT  Treating high blood pressure includes making lifestyle changes and possibly taking medicine. Living a healthy lifestyle can help lower high blood pressure. You may need to change some of your habits. Lifestyle changes may include:  Following the DASH diet. This diet is high in fruits, vegetables, and whole grains. It is low in salt, red meat, and added sugars.  Getting at least 2 hours of brisk physical activity every week.  Losing weight if necessary.  Not smoking.  Limiting  alcoholic beverages.  Learning ways to reduce stress. If lifestyle changes are not enough to get your blood pressure under control, your health care provider may  prescribe medicine. You may need to take more than one. Work closely with your health care provider to understand the risks and benefits. HOME CARE INSTRUCTIONS  Have your blood pressure rechecked as directed by your health care provider.   Take medicines only as directed by your health care provider. Follow the directions carefully. Blood pressure medicines must be taken as prescribed. The medicine does not work as well when you skip doses. Skipping doses also puts you at risk for problems.   Do not smoke.   Monitor your blood pressure at home as directed by your health care provider. SEEK MEDICAL CARE IF:   You think you are having a reaction to medicines taken.  You have recurrent headaches or feel dizzy.  You have swelling in your ankles.  You have trouble with your vision. SEEK IMMEDIATE MEDICAL CARE IF:  You develop a severe headache or confusion.  You have unusual weakness, numbness, or feel faint.  You have severe chest or abdominal pain.  You vomit repeatedly.  You have trouble breathing. MAKE SURE YOU:   Understand these instructions.  Will watch your condition.  Will get help right away if you are not doing well or get worse. Document Released: 02/27/2005 Document Revised: 07/14/2013 Document Reviewed: 12/20/2012 [facility] Patient Information 2015 [place], [place]. This information is not intended to replace advice given to you by your health care provider. Make sure you discuss any questions you have with your health care provider. Diabetes and Exercise Exercising regularly is important. It is not just about losing weight. It has many health benefits, such as:  Improving your overall fitness, flexibility, and endurance.  Increasing your bone density.  Helping with weight control.  Decreasing your body fat.  Increasing your muscle strength.  Reducing stress and tension.  Improving your overall health. People with diabetes who exercise gain  additional benefits because exercise:  Reduces appetite.  Improves the body's use of blood sugar (glucose).  Helps lower or control blood glucose.  Decreases blood pressure.  Helps control blood lipids (such as cholesterol and triglycerides).  Improves the body's use of the hormone insulin by:  Increasing the body's insulin sensitivity.  Reducing the body's insulin needs.  Decreases the risk for heart disease because exercising:  Lowers cholesterol and triglycerides levels.  Increases the levels of good cholesterol (such as high-density lipoproteins [HDL]) in the body.  Lowers blood glucose levels. YOUR ACTIVITY PLAN  Choose an activity that you enjoy and set realistic goals. Your health care provider or diabetes educator can help you make an activity plan that works for you. Exercise regularly as directed by your health care provider. This includes:  Performing resistance training twice a week such as push-ups, sit-ups, lifting weights, or using resistance bands.  Performing 150 minutes of cardio exercises each week such as walking, running, or playing sports.  Staying active and spending no more than 90 minutes at one time being inactive. Even short bursts of exercise are good for you. Three 10-minute sessions spread throughout the day are just as beneficial as a single 30-minute session. Some exercise ideas include:  Taking the dog for a walk.  Taking the stairs instead of the elevator.  Dancing to your favorite song.  Doing an exercise video.  Doing your favorite exercise with a friend. RECOMMENDATIONS FOR EXERCISING WITH TYPE 1 OR  TYPE 2 DIABETES   Check your blood glucose before exercising. If blood glucose levels are greater than 240 mg/dL, check for urine ketones. Do not exercise if ketones are present.  Avoid injecting insulin into areas of the body that are going to be exercised. For example, avoid injecting insulin into:  The arms when playing  tennis.  The legs when jogging.  Keep a record of:  Food intake before and after you exercise.  Expected peak times of insulin action.  Blood glucose levels before and after you exercise.  The type and amount of exercise you have done.  Review your records with your health care provider. Your health care provider will help you to develop guidelines for adjusting food intake and insulin amounts before and after exercising.  If you take insulin or oral hypoglycemic agents, watch for signs and symptoms of hypoglycemia. They include:  Dizziness.  Shaking.  Sweating.  Chills.  Confusion.  Drink plenty of water while you exercise to prevent dehydration or heat stroke. Body water is lost during exercise and must be replaced.  Talk to your health care provider before starting an exercise program to make sure it is safe for you. Remember, almost any type of activity is better than none. Document Released: 05/20/2003 Document Revised: 07/14/2013 Document Reviewed: 08/06/2012 [facility] Patient Information 2015 [place], [place]. This information is not intended to replace advice given to you by your health care provider. Make sure you discuss any questions you have with your health care provider. Basic Carbohydrate Counting for Diabetes Mellitus Carbohydrate counting is a method for keeping track of the amount of carbohydrates you eat. Eating carbohydrates naturally increases the level of sugar (glucose) in your blood, so it is important for you to know the amount that is okay for you to have in every meal. Carbohydrate counting helps keep the level of glucose in your blood within normal limits. The amount of carbohydrates allowed is different for every person. A dietitian can help you calculate the amount that is right for you. Once you know the amount of carbohydrates you can have, you can count the carbohydrates in the foods you want to eat. Carbohydrates are found in the following  foods:  Grains, such as breads and cereals.  Dried beans and soy products.  Starchy vegetables, such as potatoes, peas, and corn.  Fruit and fruit juices.  Milk and yogurt.  Sweets and snack foods, such as cake, cookies, candy, chips, soft drinks, and fruit drinks. CARBOHYDRATE COUNTING There are two ways to count the carbohydrates in your food. You can use either of the methods or a combination of both. Reading the "Nutrition Facts" on [place] The "Nutrition Facts" is an area that is included on the labels of almost all packaged food and beverages in the [place]. It includes the serving size of that food or beverage and information about the nutrients in each serving of the food, including the grams (g) of carbohydrate per serving.  Decide the number of servings of this food or beverage that you will be able to eat or drink. Multiply that number of servings by the number of grams of carbohydrate that is listed on the label for that serving. The total will be the amount of carbohydrates you will be having when you eat or drink this food or beverage. Learning Standard Serving Sizes of Food When you eat food that is not packaged or does not include "Nutrition Facts" on the label, you need to measure the  servings in order to count the amount of carbohydrates.A serving of most carbohydrate-rich foods contains about 15 g of carbohydrates. The following list includes serving sizes of carbohydrate-rich foods that provide 15 g ofcarbohydrate per serving:   1 slice of bread (1 oz) or 1 six-inch tortilla.    of a hamburger bun or English muffin.  4-6 crackers.   cup unsweetened dry cereal.    cup hot cereal.   cup rice or pasta.    cup mashed potatoes or  of a large baked potato.  1 cup fresh fruit or one small piece of fruit.    cup canned or frozen fruit or fruit juice.  1 cup milk.   cup plain fat-free yogurt or yogurt sweetened with artificial  sweeteners.   cup cooked dried beans or starchy vegetable, such as peas, corn, or potatoes.  Decide the number of standard-size servings that you will eat. Multiply that number of servings by 15 (the grams of carbohydrates in that serving). For example, if you eat 2 cups of strawberries, you will have eaten 2 servings and 30 g of carbohydrates (2 servings x 15 g = 30 g). For foods such as soups and casseroles, in which more than one food is mixed in, you will need to count the carbohydrates in each food that is included. EXAMPLE OF CARBOHYDRATE COUNTING Sample Dinner  3 oz chicken breast.   cup of brown rice.   cup of corn.  1 cup milk.   1 cup strawberries with sugar-free whipped topping.  Carbohydrate Calculation Step 1: Identify the foods that contain carbohydrates:   Rice.   Corn.   Milk.   Strawberries. Step 2:Calculate the number of servings eaten of each:   2 servings of rice.   1 serving of corn.   1 serving of milk.   1 serving of strawberries. Step 3: Multiply each of those number of servings by 15 g:   2 servings of rice x 15 g = 30 g.   1 serving of corn x 15 g = 15 g.   1 serving of milk x 15 g = 15 g.   1 serving of strawberries x 15 g = 15 g. Step 4: Add together all of the amounts to find the total grams of carbohydrates eaten: 30 g + 15 g + 15 g + 15 g = 75 g. Document Released: 02/27/2005 Document Revised: 07/14/2013 Document Reviewed: 01/24/2013 [facility] Patient Information 2015 [place], [place]. This information is not intended to replace advice given to you by your health care provider. Make sure you discuss any questions you have with your health care provider.

## 2014-02-17 NOTE — Progress Notes (Signed)
Pt is here following up on her diabetes. Pt has been out of her medications for about a month. Pt reports having depression.

## 2014-02-17 NOTE — Progress Notes (Signed)
Patient ID: Gina [name], female   DOB: 11-26-1960, 53 y.o.   MRN: [id number]   Gina [name], is a 53 y.o. female  [id number]:[id number]  [id number]:[id number]  DOB - 1960/11/15  Chief Complaint  Patient presents with  . Follow-up        Subjective:   Gina [name] is a 53 y.o. female here today for a follow up visit. Patient has hypertension and diabetes mellitus admitted for DKA in May 2015, hemoglobin A1c was over 15%, currently managed on insulin. Patient is here today for routine follow-up of diabetes and hypertension. Patient's blood sugar is not controlled, patient is finding it difficult to get medications due to financial reasons, has not been taking them as she should, she has been out of all her medications for over a month. Blood pressure is better controlled. Patient claims she is depressed due to her medical conditions and financial situation. She denies any suicidal ideation or thoughts. Patient does not smoke cigarette, she does not drink alcohol. Patient has No headache, No chest pain, No abdominal pain - No Nausea, No new weakness tingling or numbness, No Cough - SOB.  Problem  Essential Hypertension  Colon Cancer Screening  Breast Cancer Screening    ALLERGIES: Allergies  Allergen Reactions  . Metformin And Related Other (See Comments)    Upset stomach    PAST MEDICAL HISTORY: Past Medical History  Diagnosis Date  . Hypertension   . Diabetes mellitus     MEDICATIONS AT HOME: Prior to Admission medications   Medication Sig Start Date End Date Taking? Authorizing Provider  famotidine (PEPCID) 40 MG tablet Take 1 tablet (40 mg total) by mouth daily. 02/17/14   Tresa Garter, MD  glucose blood test strip Use strip three times a day to check blood sugar. 02/17/14   Tresa Garter, MD  glucose monitoring kit (FREESTYLE) monitoring kit Use glucometer to check sugar three times a day 02/17/14   Tresa Garter, MD  insulin aspart protamine- aspart (NOVOLOG MIX  70/30) (70-30) 100 UNIT/ML injection Inject 0.26 mLs (26 Units total) into the skin 2 (two) times daily with a meal. 02/17/14   Tresa Garter, MD  Lancets 30G MISC Use to check blood sugar three times a day 02/17/14   Tresa Garter, MD  lisinopril (PRINIVIL,ZESTRIL) 20 MG tablet Take 1 tablet (20 mg total) by mouth daily. 02/17/14   Tresa Garter, MD  metFORMIN (GLUCOPHAGE) 1000 MG tablet Take 1 tablet (1,000 mg total) by mouth 2 (two) times daily with a meal. 02/17/14   Amando Chaput E Doreene Burke, MD  Syringe/Needle, Disp, (PRODIGY SAFETY SYRINGES) 29G X 1/2" 1 ML MISC Use syringe to inject insulin twice a day 02/17/14   Tresa Garter, MD     Objective:   Filed Vitals:   02/17/14 1049  BP: 141/87  Pulse: 82  Temp: 98.2 F (36.8 C)  TempSrc: Oral  Resp: 16  Height: 5' 2"  (1.575 m)  Weight: 170 lb (77.111 kg)  SpO2: 97%    Exam General appearance : Awake, alert, not in any distress. Speech Clear. Not toxic looking HEENT: Atraumatic and Normocephalic, pupils equally reactive to light and accomodation Neck: supple, no JVD. No cervical lymphadenopathy.  Chest:Good air entry bilaterally, no added sounds  CVS: S1 S2 regular, no murmurs.  Abdomen: Bowel sounds present, Non tender and not distended with no gaurding, rigidity or rebound. Extremities: B/L Lower Ext shows no edema, both legs are warm to touch Neurology: Awake alert, and  oriented X 3, CN II-XII intact, Non focal Skin:No Rash Wounds:N/A  Data Review Lab Results  Component Value Date   HGBA1C 14.9 02/17/2014   HGBA1C 15.1* 06/18/2013   HGBA1C 13.3* 07/31/2012     Assessment & Plan   1. Type 2 diabetes mellitus  - Glucose (CBG) - HgB A1c - insulin aspart (novoLOG) injection 20 Units; Inject 0.2 mLs (20 Units total) into the skin once. - glucose monitoring kit (FREESTYLE) monitoring kit; Use glucometer to check sugar three times a day  Dispense: 1 each; Refill: 0  - Ambulatory referral to  Ophthalmology   Aim for 30 minutes of exercise most days. Rethink what you drink. Water is great! Aim for 2-3 Carb Choices per meal (30-45 grams) +/- 1 either way  Aim for 0-15 Carbs per snack if hungry  Include protein in moderation with your meals and snacks  Consider reading food labels for Total Carbohydrate and Fat Grams of foods  Consider checking BG at alternate times per day  Continue taking medication as directed Be mindful about how much sugar you are adding to beverages and other foods. Fruit Punch - find one with no sugar  Measure and decrease portions of carbohydrate foods  Make your plate and don't go back for seconds   2. Essential hypertension  - famotidine (PEPCID) 40 MG tablet; Take 1 tablet (40 mg total) by mouth daily.  Dispense: 90 tablet; Refill: 3 - lisinopril (PRINIVIL,ZESTRIL) 20 MG tablet; Take 1 tablet (20 mg total) by mouth daily.  Dispense: 90 tablet; Refill: 3  We have discussed target BP range and blood pressure goal. I have advised patient to check BP regularly and to call us back or report to clinic if the numbers are consistently higher than 140/90. We discussed the importance of compliance with medical therapy and DASH diet recommended, consequences of uncontrolled hypertension discussed.  - continue current BP medications  3. Type 2 diabetes mellitus with hyperglycemia  - glucose blood test strip; Use strip three times a day to check blood sugar.  Dispense: 100 each; Refill: 6 - insulin aspart protamine- aspart (NOVOLOG MIX 70/30) (70-30) 100 UNIT/ML injection; Inject 0.26 mLs (26 Units total) into the skin 2 (two) times daily with a meal.  Dispense: 3 vial; Refill: 3 - Lancets 30G MISC; Use to check blood sugar three times a day  Dispense: 100 each; Refill: 6 - metFORMIN (GLUCOPHAGE) 1000 MG tablet; Take 1 tablet (1,000 mg total) by mouth 2 (two) times daily with a meal.  Dispense: 180 tablet; Refill: 3 - Syringe/Needle, Disp, (PRODIGY SAFETY  SYRINGES) 29G X 1/2" 1 ML MISC; Use syringe to inject insulin twice a day  Dispense: 100 each; Refill: 6  4. Colon cancer screening  - Ambulatory referral to Gastroenterology - [facility] COLONOSCOPY  5. Breast cancer screening  - MM Digital Screening; Future  Licensed clinical social worker was asked to see patient and to give recommendations, counseling and possible evaluation and assistance with community resources.  Return in about 3 months (around 05/19/2014), or if symptoms worsen or fail to improve, for Hemoglobin A1C and Follow up, DM, Follow up HTN.  The patient was given clear instructions to go to ER or return to medical center if symptoms don't improve, worsen or new problems develop. The patient verbalized understanding. The patient was told to call to get lab results if they haven't heard anything in the next week.   This note has been created with Museum/gallery curator and smart  Company secretary. Any transcriptional errors are unintentional.    Angelica Chessman, MD, [place], [place], [place] and [place], [place]   02/17/2014, 11:50 AM

## 2014-12-25 ENCOUNTER — Ambulatory Visit: Payer: Self-pay

## 2015-04-07 ENCOUNTER — Emergency Department (HOSPITAL_COMMUNITY)
Admission: EM | Admit: 2015-04-07 | Discharge: 2015-04-07 | Disposition: A | Discharge status: Home / Self Care | Payer: Self-pay | Attending: Emergency Medicine | Admitting: Emergency Medicine

## 2015-04-07 ENCOUNTER — Encounter (HOSPITAL_COMMUNITY): Payer: Self-pay | Admitting: *Deleted

## 2015-04-07 DIAGNOSIS — M25512 Pain in left shoulder: Secondary | ICD-10-CM | POA: Insufficient documentation

## 2015-04-07 DIAGNOSIS — Z79899 Other long term (current) drug therapy: Secondary | ICD-10-CM | POA: Insufficient documentation

## 2015-04-07 DIAGNOSIS — Z794 Long term (current) use of insulin: Secondary | ICD-10-CM | POA: Insufficient documentation

## 2015-04-07 DIAGNOSIS — I10 Essential (primary) hypertension: Secondary | ICD-10-CM | POA: Insufficient documentation

## 2015-04-07 DIAGNOSIS — E1165 Type 2 diabetes mellitus with hyperglycemia: Secondary | ICD-10-CM

## 2015-04-07 DIAGNOSIS — M79601 Pain in right arm: Secondary | ICD-10-CM | POA: Insufficient documentation

## 2015-04-07 DIAGNOSIS — E119 Type 2 diabetes mellitus without complications: Secondary | ICD-10-CM | POA: Insufficient documentation

## 2015-04-07 DIAGNOSIS — M25511 Pain in right shoulder: Secondary | ICD-10-CM | POA: Insufficient documentation

## 2015-04-07 DIAGNOSIS — M79602 Pain in left arm: Secondary | ICD-10-CM | POA: Insufficient documentation

## 2015-04-07 DIAGNOSIS — M546 Pain in thoracic spine: Secondary | ICD-10-CM | POA: Insufficient documentation

## 2015-04-07 DIAGNOSIS — M7918 Myalgia, other site: Secondary | ICD-10-CM

## 2015-04-07 MED ORDER — METFORMIN HCL 1000 MG PO TABS: 1000.0000 mg | ORAL_TABLET | Freq: Two times a day (BID) | ORAL | Status: DC

## 2015-04-07 MED ORDER — LISINOPRIL 20 MG PO TABS: 20.0000 mg | ORAL_TABLET | Freq: Every day | ORAL | Status: DC

## 2015-04-07 NOTE — Discharge Instructions (Signed)

## 2015-04-07 NOTE — ED Provider Notes (Signed)
CSN: [id number]     Arrival date & time 04/07/15  1450 History  By signing my name below, I, [facility], attest that this documentation has been prepared under the direction and in the presence of Margarita Mail, PA-C. Electronically Signed: Virgel Bouquet, ED Scribe. 04/07/2015. 6:05 PM.     Chief Complaint  Patient presents with  . Generalized Body Aches   The history is provided by the patient. No language interpreter was used.   HPI Comments: Gina [name] is a 55 y.o. female with an hx of DM and HTN who presents to the Emergency Department complaining of constant, ongoing, gradually worsening, mild, sore generalized body aches, greater in severity in the upper back, BUE, and bilateral shoulders, worse 2 days ago. 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 Pain is worse with movement. She has not taken any medications but has taken warm baths and soaked in epsom salt without temporary relief. Per patient, she takes Metformin and lisinopril but has run out of these prescriptions. She denies sore throat.   Past Medical History  Diagnosis Date  . Hypertension   . Diabetes mellitus    Past Surgical History  Procedure Laterality Date  . Tubal ligation     Family History  Problem Relation Age of Onset  . Diabetes type II Father   . Diabetes Mellitus II Brother   . CAD Father   . Prostate cancer Father    Social History  Substance Use Topics  . Smoking status: Never Smoker   . Smokeless tobacco: None  . Alcohol Use: No     Comment: occasionally   OB History    No data available     Review of Systems  HENT: Negative for sore throat.   Musculoskeletal: Positive for myalgias.      Allergies  Metformin and related  Home Medications   Prior to Admission medications   Medication Sig Start Date End Date Taking? Authorizing  Provider  famotidine (PEPCID) 40 MG tablet Take 1 tablet (40 mg total) by mouth daily. 02/17/14   Tresa Garter, MD  glucose blood test strip Use strip three times a day to check blood sugar. 02/17/14   Tresa Garter, MD  glucose monitoring kit (FREESTYLE) monitoring kit Use glucometer to check sugar three times a day 02/17/14   Tresa Garter, MD  insulin aspart protamine- aspart (NOVOLOG MIX 70/30) (70-30) 100 UNIT/ML injection Inject 0.26 mLs (26 Units total) into the skin 2 (two) times daily with a meal. 02/17/14   Tresa Garter, MD  Lancets 30G MISC Use to check blood sugar three times a day 02/17/14   Tresa Garter, MD  lisinopril (PRINIVIL,ZESTRIL) 20 MG tablet Take 1 tablet (20 mg total) by mouth daily. 04/07/15   Margarita Mail, PA-C  metFORMIN (GLUCOPHAGE) 1000 MG tablet Take 1 tablet (1,000 mg total) by mouth 2 (two) times daily with a meal. 04/07/15   Margarita Mail, PA-C  Syringe/Needle, Disp, (PRODIGY SAFETY SYRINGES) 29G X 1/2" 1 ML MISC Use syringe to inject insulin twice a day 02/17/14   Tresa Garter, MD   BP 118/84 mmHg  Pulse 96  Temp(Src) 99.8 F (37.7 C)  Resp 18  Ht _0  (1.575 m)  Wt 147 lb 7 oz (66.877 kg)  BMI 26.96 kg/m2  SpO2 99%  LMP 03/29/2015  Physical Exam  Constitutional: She is oriented to person, place, and time. She appears well-developed and well-nourished. No distress.  HENT:  Head: Normocephalic and atraumatic.  Eyes: Conjunctivae and EOM are normal.  Neck: Neck supple. No tracheal deviation present.  Cardiovascular: Normal rate.   Pulmonary/Chest: Effort normal. No respiratory distress.  Musculoskeletal: Normal range of motion. She exhibits tenderness.  TTP in shoulders, pecs, and trapezius. FROM in BUE and shoulders.  Neurological: She is alert and oriented to person, place, and time.  Skin: Skin is warm and dry.  Psychiatric: She has a normal mood and affect. Her behavior is normal.  Nursing note and vitals  reviewed.   ED Course  Procedures  DIAGNOSTIC STUDIES: Oxygen Saturation is 99% on RA, normal by my interpretation.    COORDINATION OF CARE: 5:54 PM Will refill prescriptions. Advised pt to take OTC pain medication. Discussed treatment plan with pt at bedside and pt agreed to plan.  Labs Review Labs Reviewed - No data to display  Imaging Review No results found. I have personally reviewed and evaluated these images and lab results as part of my medical decision-making.   EKG Interpretation None      MDM   Final diagnoses:  Muscle pain, myofascial    Patient with muscle pain. No concern for rhabdomyolysis or other myositis. Afebrile. Encouraged anti-inflammatory medications. Patient is safe for discharge at this time. We filled patient's medications  I personally performed the services described in this documentation, which was scribed in my presence. The recorded information has been reviewed and is accurate.       Margarita Mail, PA-C 04/08/15 0129  Virgel Manifold, MD 04/11/15 2129

## 2015-04-07 NOTE — ED Notes (Signed)
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  Pt st's she is sore all over from lifting them.

## 2015-04-07 NOTE — ED Notes (Signed)
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  She has upper back both arms and shoulders  Worse since monday

## 2016-03-29 ENCOUNTER — Ambulatory Visit: Payer: Self-pay | Admitting: Family Medicine

## 2016-04-01 ENCOUNTER — Emergency Department (HOSPITAL_COMMUNITY)
Admission: EM | Admit: 2016-04-01 | Discharge: 2016-04-01 | Disposition: A | Discharge status: Home / Self Care | Payer: Self-pay | Attending: Emergency Medicine | Admitting: Emergency Medicine

## 2016-04-01 ENCOUNTER — Emergency Department (HOSPITAL_COMMUNITY): Payer: Self-pay

## 2016-04-01 ENCOUNTER — Encounter (HOSPITAL_COMMUNITY): Payer: Self-pay | Admitting: Emergency Medicine

## 2016-04-01 DIAGNOSIS — I10 Essential (primary) hypertension: Secondary | ICD-10-CM | POA: Insufficient documentation

## 2016-04-01 DIAGNOSIS — Z794 Long term (current) use of insulin: Secondary | ICD-10-CM | POA: Insufficient documentation

## 2016-04-01 DIAGNOSIS — N3000 Acute cystitis without hematuria: Secondary | ICD-10-CM | POA: Insufficient documentation

## 2016-04-01 DIAGNOSIS — E119 Type 2 diabetes mellitus without complications: Secondary | ICD-10-CM | POA: Insufficient documentation

## 2016-04-01 DIAGNOSIS — R110 Nausea: Secondary | ICD-10-CM | POA: Insufficient documentation

## 2016-04-01 DIAGNOSIS — Z79899 Other long term (current) drug therapy: Secondary | ICD-10-CM | POA: Insufficient documentation

## 2016-04-01 LAB — COMPREHENSIVE METABOLIC PANEL
ALT: 29 U/L (ref 14–54)
AST: 32 U/L (ref 15–41)
Albumin: 3.6 g/dL (ref 3.5–5.0)
Alkaline Phosphatase: 64 U/L (ref 38–126)
Anion gap: 10 (ref 5–15)
BUN: 14 mg/dL (ref 6–20)
CHLORIDE: 97 mmol/L — AB (ref 101–111)
CO2: 26 mmol/L (ref 22–32)
CREATININE: 0.61 mg/dL (ref 0.44–1.00)
Calcium: 8.9 mg/dL (ref 8.9–10.3)
GFR calc non Af Amer: >60 mL/min (ref >60)
Glucose, Bld: 366 mg/dL — ABNORMAL HIGH (ref 65–99)
Potassium: 4.2 mmol/L (ref 3.5–5.1)
SODIUM: 133 mmol/L — AB (ref 135–145)
Total Bilirubin: 0.9 mg/dL (ref 0.3–1.2)
Total Protein: 7.5 g/dL (ref 6.5–8.1)

## 2016-04-01 LAB — CBC WITH DIFFERENTIAL/PLATELET
BASOS ABS: 0 10*3/uL (ref 0.0–0.1)
Basophils Relative: 0 %
Eosinophils Absolute: 0 10*3/uL (ref 0.0–0.7)
Eosinophils Relative: 0 %
HCT: 39.6 % (ref 36.0–46.0)
HEMOGLOBIN: 13.7 g/dL (ref 12.0–15.0)
Lymphocytes Relative: 35 %
Lymphs Abs: 1.3 10*3/uL (ref 0.7–4.0)
MCH: 30.9 pg (ref 26.0–34.0)
MCHC: 34.6 g/dL (ref 30.0–36.0)
MCV: 89.2 fL (ref 78.0–100.0)
MONO ABS: 0.6 10*3/uL (ref 0.1–1.0)
Monocytes Relative: 17 %
NEUTROS ABS: 1.8 10*3/uL (ref 1.7–7.7)
Neutrophils Relative %: 48 %
Platelets: 131 10*3/uL — ABNORMAL LOW (ref 150–400)
RBC: 4.44 MIL/uL (ref 3.87–5.11)
RDW: 12 % (ref 11.5–15.5)
WBC: 3.7 10*3/uL — ABNORMAL LOW (ref 4.0–10.5)

## 2016-04-01 LAB — BLOOD GAS, VENOUS
Acid-Base Excess: 3.2 mmol/L — ABNORMAL HIGH (ref 0.0–2.0)
Bicarbonate: 29.4 mmol/L — ABNORMAL HIGH (ref 20.0–28.0)
O2 Saturation: 39.9 %
Patient temperature: 98.6
pCO2, Ven: 53.6 mmHg (ref 44.0–60.0)
pH, Ven: 7.359 (ref 7.250–7.430)

## 2016-04-01 LAB — D-DIMER, QUANTITATIVE (NOT AT ARMC): D-Dimer, Quant: <0.27 ug/mL-FEU (ref 0.00–0.50)

## 2016-04-01 LAB — URINALYSIS, ROUTINE W REFLEX MICROSCOPIC
BILIRUBIN URINE: NEGATIVE
Ketones, ur: 20 mg/dL — AB
LEUKOCYTES UA: NEGATIVE
Nitrite: POSITIVE — AB
PH: 6 (ref 5.0–8.0)
Protein, ur: 30 mg/dL — AB
Specific Gravity, Urine: 1.032 — ABNORMAL HIGH (ref 1.005–1.030)

## 2016-04-01 LAB — I-STAT TROPONIN, ED: Troponin i, poc: 0 ng/mL (ref 0.00–0.08)

## 2016-04-01 LAB — CBG MONITORING, ED: Glucose-Capillary: 346 mg/dL — ABNORMAL HIGH (ref 65–99)

## 2016-04-01 LAB — LIPASE, BLOOD: Lipase: 27 U/L (ref 11–51)

## 2016-04-01 LAB — I-STAT CG4 LACTIC ACID, ED
Lactic Acid, Venous: 1.36 mmol/L (ref 0.5–1.9)
Lactic Acid, Venous: 0.97 mmol/L (ref 0.5–1.9)

## 2016-04-01 MED ORDER — ASPIRIN 81 MG PO CHEW
324.0000 mg | CHEWABLE_TABLET | Freq: Once | ORAL | Status: AC
  Administered 2016-04-01: 324 mg via ORAL
  Filled 2016-04-01: qty 4

## 2016-04-01 MED ORDER — SODIUM CHLORIDE 0.9 % IV BOLUS (SEPSIS)
1000.0000 mL | Freq: Once | INTRAVENOUS | Status: AC
  Administered 2016-04-01: 1000 mL via INTRAVENOUS

## 2016-04-01 MED ORDER — CEPHALEXIN 500 MG PO CAPS
500.0000 mg | ORAL_CAPSULE | Freq: Once | ORAL | Status: AC
  Administered 2016-04-01: 500 mg via ORAL
  Filled 2016-04-01: qty 1

## 2016-04-01 MED ORDER — ONDANSETRON HCL 4 MG/2ML IJ SOLN
4.0000 mg | Freq: Once | INTRAMUSCULAR | Status: AC
  Administered 2016-04-01: 4 mg via INTRAVENOUS
  Filled 2016-04-01: qty 2

## 2016-04-01 MED ORDER — CEPHALEXIN 500 MG PO CAPS: 500.0000 mg | ORAL_CAPSULE | Freq: Two times a day (BID) | ORAL | 0 refills | Status: AC

## 2016-04-01 MED ORDER — ONDANSETRON HCL 4 MG PO TABS: 4.0000 mg | ORAL_TABLET | Freq: Three times a day (TID) | ORAL | 0 refills | Status: DC | PRN

## 2016-04-01 NOTE — ED Notes (Signed)
ED Provider at bedside. 

## 2016-04-01 NOTE — ED Notes (Signed)
Bed: BK47 Expected date:  Expected time:  Means of arrival:  Comments: N/V /D

## 2016-04-01 NOTE — ED Notes (Signed)
345 cbg

## 2016-04-01 NOTE — ED Notes (Signed)
Unable to collect labs patient is in xray 

## 2016-04-01 NOTE — Discharge Instructions (Signed)
Please take your antibiotics twice a day to treat your urinary tract infection. Please take your nausea medicine as needed for ear nausea. Please stay hydrated. Please continue to wash her hands. Please follow-up with a primary care physician in the next few days for follow-up.

## 2016-04-01 NOTE — ED Notes (Signed)
Patient transported to X-ray 

## 2016-04-01 NOTE — ED Provider Notes (Signed)
[facility] DEPT Provider Note   CSN: [id number] Arrival date & time: 04/01/16  0847     History   Chief Complaint Chief Complaint  Gina [name] presents with  . Flu Like Symptoms  . Hyperglycemia    HPI Gina [name] is a 56 y.o. female with a past medical history significant for hypertension and uncontrolled diabetes who presents with fevers, chills, nausea, diarrhea, decrease in oral intake, increasing urination, productive cough, hemoptysis, rhinorrhea, congestion, chest pain and myalgias. Gina [name] says that she was recently homeless but now has a place to stay. She says that she has been feeling bad for the past week. She says that she has been having intermittent fevers and chills but has not taken her temperature. She has had decrease in appetite and has had nausea. She says she has not eaten in several days. She reports that with her cough, she has had some hemoptysis and coughing up a "chunk of blood". She also says that she has been having congestion and runny nose. Gina [name] denies dysuria or change in urine color. She describes chest pain as moderate and located in her central chest. She says it is associated with her coughing. It is an aching pain and radiates towards her abdomen. She denies any recent traumas. She denies any suicidal ideation or homicidal ideation at this time.   Her chart does reveal and history of diabetic ketoacidosis and Gina [name] says she has not taken any medicines and "years". She says that she is concerned about dying from metformin.    HPI  Past Medical History:  Diagnosis Date  . Diabetes mellitus   . Hypertension     Gina [name] Active Problem List   Diagnosis Date Noted  . Essential hypertension 02/17/2014  . Colon cancer screening 02/17/2014  . Breast cancer screening 02/17/2014  . Diabetes ([place]) 07/28/2013  . Cholelithiases 07/28/2013  . DKA (diabetic ketoacidoses) ([place]) 06/18/2013  . Abdominal pain 06/18/2013  . HTN (hypertension)  06/18/2013  . Cellulitis of female genitalia 08/03/2012  . Type II or unspecified type diabetes mellitus without mention of complication, uncontrolled 08/03/2012  . Hyperglycemia 07/31/2012  . Candidal skin infection 07/31/2012  . Cellulitis and abscess 07/31/2012  . Anemia 07/31/2012    Past Surgical History:  Procedure Laterality Date  . TUBAL LIGATION      OB History    No data available       Home Medications    Prior to Admission medications   Medication Sig Start Date End Date Taking? Authorizing Provider  famotidine (PEPCID) 40 MG tablet Take 1 tablet (40 mg total) by mouth daily. 02/17/14   Tresa Garter, MD  glucose blood test strip Use strip three times a day to check blood sugar. 02/17/14   Tresa Garter, MD  glucose monitoring kit (FREESTYLE) monitoring kit Use glucometer to check sugar three times a day 02/17/14   Tresa Garter, MD  insulin aspart protamine- aspart (NOVOLOG MIX 70/30) (70-30) 100 UNIT/ML injection Inject 0.26 mLs (26 Units total) into the skin 2 (two) times daily with a meal. 02/17/14   Tresa Garter, MD  Lancets 30G MISC Use to check blood sugar three times a day 02/17/14   Tresa Garter, MD  lisinopril (PRINIVIL,ZESTRIL) 20 MG tablet Take 1 tablet (20 mg total) by mouth daily. 04/07/15   Margarita Mail, PA-C  metFORMIN (GLUCOPHAGE) 1000 MG tablet Take 1 tablet (1,000 mg total) by mouth 2 (two) times daily with a meal. 04/07/15   Margarita Mail,  PA-C  Syringe/Needle, Disp, (PRODIGY SAFETY SYRINGES) 29G X 1/2" 1 ML MISC Use syringe to inject insulin twice a day 02/17/14   Tresa Garter, MD    Family History Family History  Problem Relation Age of Onset  . Diabetes type II Father   . CAD Father   . Prostate cancer Father   . Diabetes Mellitus II Brother     Social History Social History  Substance Use Topics  . Smoking status: Never Smoker  . Smokeless tobacco: Not on file  . Alcohol use No     Comment:  occasionally     Allergies   Metformin and related   Review of Systems Review of Systems  Constitutional: Positive for chills, fatigue and fever.  HENT: Positive for congestion and rhinorrhea.   Respiratory: Positive for cough, chest tightness and shortness of breath.   Cardiovascular: Positive for chest pain. Negative for palpitations and leg swelling.  Gastrointestinal: Positive for nausea. Negative for abdominal distention, constipation, diarrhea and vomiting.  Genitourinary: Positive for frequency. Negative for dysuria and flank pain.  Musculoskeletal: Negative for back pain.  Neurological: Negative for dizziness, syncope, light-headedness and headaches.  Psychiatric/Behavioral: Negative for agitation.  All other systems reviewed and are negative.    Physical Exam Updated Vital Signs BP (!) 123/108 (BP Location: Left Arm)   Pulse 92   Temp 98.6 F (37 C) (Oral)   Resp 16   LMP 03/29/2015   SpO2 97%   Physical Exam  Constitutional: She appears well-developed and well-nourished. No distress.  HENT:  Head: Normocephalic and atraumatic.  Nose: Rhinorrhea present.  Mouth/Throat: Oropharynx is clear and moist. No oropharyngeal exudate.  Eyes: Conjunctivae and EOM are normal. Pupils are equal, round, and reactive to light.  Neck: Neck supple.  Cardiovascular: Normal rate and regular rhythm.   No murmur heard. Pulmonary/Chest: Effort normal and breath sounds normal. No stridor. No respiratory distress. She has no wheezes. She has no rales. She exhibits tenderness.  Abdominal: Soft. There is no tenderness.  Musculoskeletal: She exhibits no edema.  Neurological: She is alert. No cranial nerve deficit or sensory deficit. She exhibits normal muscle tone. Coordination normal.  Skin: Skin is warm and dry. No erythema.  Psychiatric: She has a normal mood and affect.  Nursing note and vitals reviewed.    ED Treatments / Results  Labs (all labs ordered are listed, but only  abnormal results are displayed) Labs Reviewed  URINALYSIS, ROUTINE W REFLEX MICROSCOPIC - Abnormal; Notable for the following:       Result Value   APPearance HAZY (*)    Specific Gravity, Urine 1.032 (*)    Glucose, UA >=500 (*)    Hgb urine dipstick SMALL (*)    Ketones, ur 20 (*)    Protein, ur 30 (*)    Nitrite POSITIVE (*)    Bacteria, UA FEW (*)    Squamous Epithelial / LPF 0-5 (*)    All other components within normal limits  COMPREHENSIVE METABOLIC PANEL - Abnormal; Notable for the following:    Sodium 133 (*)    Chloride 97 (*)    Glucose, Bld 366 (*)    All other components within normal limits  CBC WITH DIFFERENTIAL/PLATELET - Abnormal; Notable for the following:    WBC 3.7 (*)    Platelets 131 (*)    All other components within normal limits  BLOOD GAS, VENOUS - Abnormal; Notable for the following:    Bicarbonate 29.4 (*)    Acid-Base  Excess 3.2 (*)    All other components within normal limits  CBG MONITORING, ED - Abnormal; Notable for the following:    Glucose-Capillary 346 (*)    All other components within normal limits  URINE CULTURE  LIPASE, BLOOD  D-DIMER, QUANTITATIVE (NOT AT [facility])  I-STAT CG4 LACTIC ACID, ED  I-STAT TROPOININ, ED  I-STAT CG4 LACTIC ACID, ED    EKG  EKG Interpretation None       Radiology Dg Chest 2 View  Result Date: 04/01/2016 CLINICAL DATA:  Flu like symptoms, chills, fever, cough EXAM: CHEST  2 VIEW COMPARISON:  10/04/2010 FINDINGS: The heart size and mediastinal contours are within normal limits. Both lungs are clear. The visualized skeletal structures are unremarkable. IMPRESSION: No active cardiopulmonary disease. Electronically Signed   By: Kathreen Devoid   On: 04/01/2016 10:00    Procedures Procedures (including critical care time)  Medications Ordered in ED Medications  sodium chloride 0.9 % bolus 1,000 mL (0 mLs Intravenous Stopped 04/01/16 1429)  sodium chloride 0.9 % bolus 1,000 mL (0 mLs Intravenous Stopped  04/01/16 1539)  ondansetron (ZOFRAN) injection 4 mg (4 mg Intravenous Given 04/01/16 1048)  aspirin chewable tablet 324 mg (324 mg Oral Given 04/01/16 1047)  cephALEXin (KEFLEX) capsule 500 mg (500 mg Oral Given 04/01/16 1458)     Initial Impression / Assessment and Plan / ED Course  I have reviewed the triage vital signs and the nursing notes.  Pertinent labs & imaging results that were available during my care of the Gina [name] were reviewed by me and considered in my medical decision making (see chart for details).     Kennedie Pardoe is a 56 y.o. female with a past medical history significant for hypertension and uncontrolled diabetes who presents with fevers, chills, nausea, diarrhea, decrease in oral intake, increasing urination, productive cough, hemoptysis, rhinorrhea, congestion, chest pain and myalgias.   history and exam are seen above.  On exam, Gina [name] has some tenderness in her central chest that reproduced her discomfort. Gina [name]'s lungs were clear. Abdomen was nontender. No significant lower extremity edema or rashes. Audible congestion and visible rhinorrhea were present. Exam otherwise unremarkable.  Based on Gina [name]'s history of DKA with her symptoms, Gina [name] will have workup for DKA versus viral infection leading to her symptoms. Gina [name] also have urinalysis to look for UTI with frequency. Gina [name] will have d-dimer, EKG, chest x-ray, and troponin to look for a pulmonary or cardiac cause for chest discomfort. Suspect musculoskeletal pain given the association with her coughing fits. As Gina [name] was formerly homeless, will also consider TB during initial workup with chest x-ray.  Diagnostic testing returned showing evidence of urinary tract infection. Other lab testing showed nonelevated white blood cell count, negative d-dimer, nonelevated lactic acid, no evidence of DKA, and elevated glucose. Suspect related glucoses in setting of UTI. Chest x-ray shows no pneumonia and EKG showed  no evidence of ischemia. Troponin was Negative.  Gina [name] given dose of Keflex for treatment of UTI. Gina [name] will be a prescription     Keflex for home treatment. Gina [name] given prescription for Zofran for nausea.  She given instructions to follow-up with PCP for further diabetes management strategies as well as for follow-up for ED visit. Gina [name] understood return precautions for any signs and symptoms of worsening infection or DKA. Gina [name] understood return precautions and Gina [name] was discharged in good condition.   Final Clinical Impressions(s) / ED Diagnoses   Final diagnoses:  Acute cystitis without hematuria  Nausea  New Prescriptions Discharge Medication List as of 04/01/2016  3:06 PM    START taking these medications   Details  cephALEXin (KEFLEX) 500 MG capsule Take 1 capsule (500 mg total) by mouth 2 (two) times daily., Starting Sat 04/01/2016, Until Sat 04/08/2016, Print    ondansetron (ZOFRAN) 4 MG tablet Take 1 tablet (4 mg total) by mouth every 8 (eight) hours as needed for nausea or vomiting., Starting Sat 04/01/2016, Print        Clinical Impression: 1. Acute cystitis without hematuria   2. Nausea     Disposition: Discharge  Condition: Good  I have discussed the results, Dx and Tx plan with the pt(& family if present). He/she/they expressed understanding and agree(s) with the plan. Discharge instructions discussed at great length. Strict return precautions discussed and pt &/or family have verbalized understanding of the instructions. No further questions at time of discharge.    Discharge Medication List as of 04/01/2016  3:06 PM    START taking these medications   Details  cephALEXin (KEFLEX) 500 MG capsule Take 1 capsule (500 mg total) by mouth 2 (two) times daily., Starting Sat 04/01/2016, Until Sat 04/08/2016, Print    ondansetron (ZOFRAN) 4 MG tablet Take 1 tablet (4 mg total) by mouth every 8 (eight) hours as needed for nausea or vomiting., Starting Sat  04/01/2016, Print        Follow Up: Marliss Coots, NP [place] [id number] [phone]     [facility] DEPT [place] [phone] [place] [phone]  If symptoms worsen     Courtney Paris, MD 04/01/16 [phone]

## 2016-04-01 NOTE — ED Triage Notes (Signed)
Per EMS pt complaint of flu like symptoms with associated chills/fever, cough, and n/v/d. Pt hx of DM but has not taken medications in years.

## 2016-04-04 LAB — URINE CULTURE: Culture: >=100000 — AB

## 2016-04-05 ENCOUNTER — Telehealth (HOSPITAL_BASED_OUTPATIENT_CLINIC_OR_DEPARTMENT_OTHER): Payer: Self-pay | Admitting: Emergency Medicine

## 2016-04-05 NOTE — Telephone Encounter (Signed)
Post ED Visit - Positive Culture Follow-up  Culture report reviewed by antimicrobial stewardship pharmacist:  []  Elenor Quinones, Pharm.D. []  Heide Guile, Pharm.D., BCPS []  Parks Neptune, Pharm.D. []  Alycia Rossetti, Pharm.D., BCPS []  Ellis, Pharm.D., BCPS, AAHIVP []  Legrand Como, Pharm.D., BCPS, AAHIVP []  Milus Glazier, Pharm.D. []  Stephens November, Pharm.DDemetrius Charity PharmD  Positive urine culture Treated with cephalexin, organism sensitive to the same and no further patient follow-up is required at this time.  Hazle Nordmann 04/05/2016, 12:01 PM

## 2016-04-17 ENCOUNTER — Ambulatory Visit: Payer: Self-pay | Admitting: Family Medicine

## 2016-04-17 NOTE — Progress Notes (Deleted)
   Subjective:  Patient ID: Gina [name], female    DOB: 1960/10/26  Age: 56 y.o. MRN: [id number]  CC: No chief complaint on file.   HPI Idolina Mantell presents for ***  Outpatient Medications Prior to Visit  Medication Sig Dispense Refill  . famotidine (PEPCID) 40 MG tablet Take 1 tablet (40 mg total) by mouth daily. 90 tablet 3  . glucose blood test strip Use strip three times a day to check blood sugar. 100 each 6  . glucose monitoring kit (FREESTYLE) monitoring kit Use glucometer to check sugar three times a day 1 each 0  . insulin aspart protamine- aspart (NOVOLOG MIX 70/30) (70-30) 100 UNIT/ML injection Inject 0.26 mLs (26 Units total) into the skin 2 (two) times daily with a meal. 3 vial 3  . Lancets 30G MISC Use to check blood sugar three times a day 100 each 6  . lisinopril (PRINIVIL,ZESTRIL) 20 MG tablet Take 1 tablet (20 mg total) by mouth daily. 90 tablet 3  . metFORMIN (GLUCOPHAGE) 1000 MG tablet Take 1 tablet (1,000 mg total) by mouth 2 (two) times daily with a meal. 180 tablet 3  . ondansetron (ZOFRAN) 4 MG tablet Take 1 tablet (4 mg total) by mouth every 8 (eight) hours as needed for nausea or vomiting. 12 tablet 0  . Syringe/Needle, Disp, (PRODIGY SAFETY SYRINGES) 29G X 1/2" 1 ML MISC Use syringe to inject insulin twice a day 100 each 6   No facility-administered medications prior to visit.     ROS Review of Systems  Review of Systems - {ros [id number]}    Objective:  There were no vitals taken for this visit.  BP/Weight 04/01/2016 04/07/2015 00/05/7046  Systolic BP 889 169 450  Diastolic BP 88 84 87  Wt. (Lbs) 145 147.44 170  BMI 26.52 26.96 31.09  Some encounter information is confidential and restricted. Go to Review Flowsheets activity to see all data.     Physical Exam   Assessment & Plan:   Problem List Items Addressed This Visit    None      No orders of the defined types were placed in this encounter.   Follow-up: No Follow-up on  file.   Alfonse Spruce FNP

## 2016-04-28 ENCOUNTER — Ambulatory Visit: Payer: Self-pay | Attending: Family Medicine | Admitting: Family Medicine

## 2016-04-28 ENCOUNTER — Encounter: Payer: Self-pay | Admitting: Licensed Clinical Social Worker

## 2016-04-28 ENCOUNTER — Other Ambulatory Visit: Payer: Self-pay

## 2016-04-28 VITALS — BP 122/67 | HR 98 | Temp 98.2°F | Resp 18 | Ht 62.0 in | Wt 128.4 lb

## 2016-04-28 DIAGNOSIS — K219 Gastro-esophageal reflux disease without esophagitis: Secondary | ICD-10-CM

## 2016-04-28 DIAGNOSIS — F418 Other specified anxiety disorders: Secondary | ICD-10-CM

## 2016-04-28 DIAGNOSIS — E1165 Type 2 diabetes mellitus with hyperglycemia: Secondary | ICD-10-CM

## 2016-04-28 DIAGNOSIS — Z79899 Other long term (current) drug therapy: Secondary | ICD-10-CM | POA: Insufficient documentation

## 2016-04-28 DIAGNOSIS — Z87440 Personal history of urinary (tract) infections: Secondary | ICD-10-CM

## 2016-04-28 DIAGNOSIS — Z7689 Persons encountering health services in other specified circumstances: Secondary | ICD-10-CM | POA: Insufficient documentation

## 2016-04-28 DIAGNOSIS — M7989 Other specified soft tissue disorders: Secondary | ICD-10-CM

## 2016-04-28 DIAGNOSIS — I10 Essential (primary) hypertension: Secondary | ICD-10-CM | POA: Insufficient documentation

## 2016-04-28 DIAGNOSIS — Z794 Long term (current) use of insulin: Secondary | ICD-10-CM | POA: Insufficient documentation

## 2016-04-28 LAB — POCT URINALYSIS DIPSTICK
BILIRUBIN UA: NEGATIVE
GLUCOSE UA: 500
Ketones, UA: NEGATIVE
LEUKOCYTES UA: NEGATIVE
Protein, UA: NEGATIVE
Spec Grav, UA: <=1.005
Urobilinogen, UA: 0.2
pH, UA: 5

## 2016-04-28 LAB — BASIC METABOLIC PANEL WITH GFR
BUN: 6 mg/dL — AB (ref 7–25)
CO2: 26 mmol/L (ref 20–31)
Calcium: 9.7 mg/dL (ref 8.6–10.4)
Chloride: 100 mmol/L (ref 98–110)
Creat: 0.68 mg/dL (ref 0.50–1.05)
Glucose, Bld: 97 mg/dL (ref 65–99)
POTASSIUM: 3.4 mmol/L — AB (ref 3.5–5.3)
Sodium: 137 mmol/L (ref 135–146)

## 2016-04-28 LAB — POCT GLYCOSYLATED HEMOGLOBIN (HGB A1C)

## 2016-04-28 LAB — GLUCOSE, POCT (MANUAL RESULT ENTRY): POC Glucose: 157 mg/dl — AB (ref 70–99)

## 2016-04-28 MED ORDER — TRUEPLUS LANCETS 28G MISC: 1.0000 | Freq: Once | 12 refills | Status: AC

## 2016-04-28 MED ORDER — FUROSEMIDE 20 MG PO TABS: 20.0000 mg | ORAL_TABLET | Freq: Every day | ORAL | 0 refills | Status: DC

## 2016-04-28 MED ORDER — TRUE METRIX METER W/DEVICE KIT: 1.0000 | PACK | Freq: Once | 0 refills | Status: AC

## 2016-04-28 MED ORDER — METFORMIN HCL ER 500 MG PO TB24: 1000.0000 mg | ORAL_TABLET | Freq: Two times a day (BID) | ORAL | 2 refills | Status: DC

## 2016-04-28 MED ORDER — ESCITALOPRAM OXALATE 10 MG PO TABS: 10.0000 mg | ORAL_TABLET | Freq: Every day | ORAL | 2 refills | Status: DC

## 2016-04-28 MED ORDER — INSULIN ASPART 100 UNIT/ML ~~LOC~~ SOLN
20.0000 [IU] | Freq: Once | SUBCUTANEOUS | Status: AC
  Administered 2016-04-28: 20 [IU] via SUBCUTANEOUS

## 2016-04-28 MED ORDER — GLUCOSE BLOOD VI STRP: ORAL_STRIP | 12 refills | Status: DC

## 2016-04-28 MED FILL — TRUEplus LANCETS 28G MISC: 30 days supply | Qty: 100 | Fill #0

## 2016-04-28 MED FILL — METFORMIN HCL ER 500 MG TAB: 500 | 30 days supply | Qty: 120 | Fill #0

## 2016-04-28 MED FILL — FUROSEMIDE 20 MG TABLET: 20 | 30 days supply | Qty: 30 | Fill #0

## 2016-04-28 MED FILL — ?ESCITALOPRAM 10 MG TABLET: 10 | 15 days supply | Qty: 30 | Fill #0

## 2016-04-28 MED FILL — TRUE METRIX TEST STRIP: 33 days supply | Qty: 100 | Fill #0

## 2016-04-28 MED FILL — !TRUE METRIX BLOOD GLUCOSE: 365 days supply | Qty: 1 | Fill #0

## 2016-04-28 NOTE — BH Specialist Note (Signed)
Session Start time: 4:05 PM   End Time: 4:30 PM Total Time:  25 minutes  Type of Service: [place] Interpreter: No.   Interpreter Name & Language: N/A # [facility] Visits July 2017-June 2018: 1st   SUBJECTIVE: Gina [name] is a 56 y.o. female  Pt. was referred by FNP [place] for:  anxiety and depression. Pt. reports the following symptoms/concerns: overwhelming feelings of sadness and worry, difficulty sleeping, low energy and motivation, difficulty concentrating, and irritability Duration of problem:  1997 "after my mother passed away" Severity: moderate Previous treatment: Pt received medication management through [facility]. She stated that she was scared to take prescribed medication   OBJECTIVE: Mood: Anxious & Affect: Appropriate Risk of harm to self or others: Pt denied SI/HI Assessments administered: PHQ-9; GAD-7  LIFE CONTEXT:  Family & Social: Pt has stable housing. She stated that her adult son (48 yo) resides with her "from time to time" She has an adult daughter (36 yo) who resides nearby and an adult son (72 yo) who is in prison. Pt receives emotional support from extended family School/ Work: Pt receives food stamps [phone]) She was denied Medicaid and Disability Self-Care: Pt denies substance use. Reports drinking one wine cooler on birthday Life changes: None reported What is important to pt/family (values): Family   GOALS ADDRESSED:  Decrease symptoms of depression Decrease symptoms of anxiety  INTERVENTIONS: Solution Focused, Strength-based and Supportive   ASSESSMENT:  Pt currently experiencing depression and anxiety. She reports overwhelming feelings of sadness and worry, difficulty sleeping, low energy and motivation, difficulty concentrating, and irritability. She receives support from family. Pt may benefit from psychotherapy and medication management. LCSWA provided education on anxiety and depression. LCSWA discussed benefits of  applying healthy coping skills to decrease symptoms and pt identified coping strategies to utilize on a daily basis. She is open to scheduling appointment with Financial Counseling to assist with financial strain. Pt was provided with community resources for crisis intervention, medication management, and therapy.      PLAN: 1. F/U with behavioral health clinician: Pt was encouraged to contact [place] if symptoms worsen or fail to improve to schedule behavioral appointments at [facility]. 2. Behavioral Health meds: None reported 3. Behavioral recommendations: LCSWA recommends that pt apply healthy coping skills discussed, schedule appointment with Financial Counseling, and utilize community resources. Pt is encouraged to schedule follow up appointment with LCSWA 4. Referral: Brief Counseling/Psychotherapy, [employer], Problem-solving teaching/coping strategies, Psychoeducation and Supportive Counseling 5. From scale of 1-10, how likely are you to follow plan: 7/10   Rebekah Chesterfield, MSW, [place] Worker 05/01/16 2:05 PM  Warmhandoff:   Warm Hand Off Completed.

## 2016-04-28 NOTE — Progress Notes (Signed)
Patient is here for re establish care  Patient has eaten today  Patient is out of medications for a while  Patient complains feet swollen

## 2016-04-28 NOTE — Patient Instructions (Signed)
Come back in 1 week for diabetes follow up with clinical pharmacist.

## 2016-04-28 NOTE — Progress Notes (Signed)
Subjective:  Patient ID: Gina [name], female    DOB: 19-May-1960  Age: 56 y.o. MRN: [id number]  CC: Establish Care   HPI Eeva Schlosser presents for to establish care for hypertension and diabetes. She reports being without her diabetic and antihypertensive medications for more than a year. History of complex social issues including homelessness. She currently is living in an apartment. She reports depressive and anxiety symptoms due to how she was treated during her period of homelessness. She denies any chest pain or shortness of breath. She does report  cough and swelling of the bilateral lower extremities for several months. She denies any paresthesias or wounds. She does report blurred vision.   Outpatient Medications Prior to Visit  Medication Sig Dispense Refill  . famotidine (PEPCID) 40 MG tablet Take 1 tablet (40 mg total) by mouth daily. 90 tablet 3  . glucose monitoring kit (FREESTYLE) monitoring kit Use glucometer to check sugar three times a day 1 each 0  . insulin aspart protamine- aspart (NOVOLOG MIX 70/30) (70-30) 100 UNIT/ML injection Inject 0.26 mLs (26 Units total) into the skin 2 (two) times daily with a meal. 3 vial 3  . Lancets 30G MISC Use to check blood sugar three times a day 100 each 6  . ondansetron (ZOFRAN) 4 MG tablet Take 1 tablet (4 mg total) by mouth every 8 (eight) hours as needed for nausea or vomiting. 12 tablet 0  . Syringe/Needle, Disp, (PRODIGY SAFETY SYRINGES) 29G X 1/2" 1 ML MISC Use syringe to inject insulin twice a day 100 each 6  . glucose blood test strip Use strip three times a day to check blood sugar. 100 each 6  . lisinopril (PRINIVIL,ZESTRIL) 20 MG tablet Take 1 tablet (20 mg total) by mouth daily. 90 tablet 3  . metFORMIN (GLUCOPHAGE) 1000 MG tablet Take 1 tablet (1,000 mg total) by mouth 2 (two) times daily with a meal. 180 tablet 3   No facility-administered medications prior to visit.     ROS Review of Systems  Eyes: Positive for  visual disturbance (blurred vision).  Respiratory: Positive for cough.   Cardiovascular: Positive for leg swelling.  Gastrointestinal: Negative.   Endocrine: Positive for polyuria.  Psychiatric/Behavioral: Positive for sleep disturbance. The patient is nervous/anxious.     Objective:  BP 122/67 (BP Location: Left Arm, Patient Position: Sitting, Cuff Size: Normal)   Pulse 98   Temp 98.2 F (36.8 C) (Oral)   Resp 18   Ht _0  (1.575 m)   Wt 128 lb 6.4 oz (58.2 kg)   SpO2 98%   BMI 23.48 kg/m   BP/Weight 04/28/2016 04/01/2016 9/62/2297  Systolic BP 989 211 941  Diastolic BP 67 88 84  Wt. (Lbs) 128.4 145 147.44  BMI 23.48 26.52 26.96  Some encounter information is confidential and restricted. Go to Review Flowsheets activity to see all data.     Physical Exam  Eyes: Conjunctivae are normal. Pupils are equal, round, and reactive to light.  Cardiovascular: Normal rate, regular rhythm, normal heart sounds and intact distal pulses.   Swelling BLE 1+.  Pulmonary/Chest: Effort normal and breath sounds normal.  Abdominal: Soft. Bowel sounds are normal.  Skin: Skin is warm and dry.  Psychiatric: Her mood appears anxious. Her speech is rapid and/or pressured. She is hyperactive. She expresses no homicidal and no suicidal ideation. She expresses no suicidal plans and no homicidal plans.  Nursing note reviewed.   Assessment & Plan:   Problem List Items Addressed This  Visit      Cardiovascular and Mediastinum   HTN (hypertension) - Primary   Relevant Medications   furosemide (LASIX) 20 MG tablet   Other Relevant Orders   BASIC METABOLIC PANEL WITH GFR (Completed)    Other Visit Diagnoses    Uncontrolled type 2 diabetes mellitus with hyperglycemia, without long-term current use of insulin (HCC)       Relevant Medications   glucose blood test strip   metFORMIN (GLUCOPHAGE-XR) 500 MG 24 hr tablet   insulin aspart (novoLOG) injection 20 Units (Completed)   Other Relevant Orders    Microalbumin/Creatinine Ratio, Urine (Completed)   BASIC METABOLIC PANEL WITH GFR (Completed)   Ambulatory referral to Ophthalmology   Glucose (CBG) (Completed)   HgB A1c (Completed)   History of UTI       Relevant Orders   Urinalysis Dipstick (Completed)   Depression with anxiety       -LCSW spoke with patient and provided counseling resources.   Relevant Medications   escitalopram (LEXAPRO) 10 MG tablet   Gastroesophageal reflux disease, esophagitis presence not specified       Take Pepcid as prescribed.    Swelling of both lower extremities       Relevant Medications   furosemide (LASIX) 20 MG tablet   Other Relevant Orders   Brain natriuretic peptide (Completed)      Meds ordered this encounter  Medications  . Blood Glucose Monitoring Suppl (TRUE METRIX METER) w/Device KIT    Sig: 1 Device by Does not apply route once.    Dispense:  1 kit    Refill:  0    Order Specific Question:   Supervising Provider    Answer:   Tresa Garter [id number]  . TRUEPLUS LANCETS 28G MISC    Sig: 1 kit by Does not apply route once.    Dispense:  100 each    Refill:  12    Order Specific Question:   Supervising Provider    Answer:   Tresa Garter [id number]  . glucose blood test strip    Sig: Use as instructed    Dispense:  100 each    Refill:  12    Order Specific Question:   Supervising Provider    Answer:   Tresa Garter [[id number]]  . furosemide (LASIX) 20 MG tablet    Sig: Take 1 tablet (20 mg total) by mouth daily.    Dispense:  30 tablet    Refill:  0    Order Specific Question:   Supervising Provider    Answer:   Tresa Garter [id number]  . metFORMIN (GLUCOPHAGE-XR) 500 MG 24 hr tablet    Sig: Take 2 tablets (1,000 mg total) by mouth 2 (two) times daily.    Dispense:  120 tablet    Refill:  2    Order Specific Question:   Supervising Provider    Answer:   Tresa Garter [id number]  . escitalopram (LEXAPRO) 10 MG tablet    Sig: Take 1 tablet (10  mg total) by mouth daily. After 1 week may increase take 2 tablets (20 mg total) by mouth daily.    Dispense:  30 tablet    Refill:  2    Order Specific Question:   Supervising Provider    Answer:   Tresa Garter [id number]  . insulin aspart (novoLOG) injection 20 Units    Follow-up: Return in about 1 week (around 05/05/2016) for Diabetes with Stacy.   Nehal Witting  Sandria Bales FNP

## 2016-04-29 LAB — MICROALBUMIN / CREATININE URINE RATIO
CREATININE, URINE: 22 mg/dL (ref 20–320)
MICROALB UR: 1.8 mg/dL
Microalb Creat Ratio: 82 mcg/mg creat — ABNORMAL HIGH (ref <30)

## 2016-04-29 LAB — BRAIN NATRIURETIC PEPTIDE: BRAIN NATRIURETIC PEPTIDE: 19 pg/mL (ref <100)

## 2016-05-05 ENCOUNTER — Other Ambulatory Visit: Payer: Self-pay | Admitting: Family Medicine

## 2016-05-08 ENCOUNTER — Other Ambulatory Visit: Payer: Self-pay | Admitting: Family Medicine

## 2016-05-08 ENCOUNTER — Telehealth: Payer: Self-pay

## 2016-05-08 DIAGNOSIS — E1165 Type 2 diabetes mellitus with hyperglycemia: Secondary | ICD-10-CM

## 2016-05-08 DIAGNOSIS — R809 Proteinuria, unspecified: Secondary | ICD-10-CM

## 2016-05-08 DIAGNOSIS — E876 Hypokalemia: Secondary | ICD-10-CM

## 2016-05-08 MED ORDER — POTASSIUM CHLORIDE ER 10 MEQ PO TBCR: 10.0000 meq | EXTENDED_RELEASE_TABLET | Freq: Two times a day (BID) | ORAL | 0 refills | Status: DC

## 2016-05-08 MED ORDER — LISINOPRIL 2.5 MG PO TABS: 2.5000 mg | ORAL_TABLET | Freq: Every day | ORAL | 2 refills | Status: DC

## 2016-05-08 NOTE — Telephone Encounter (Signed)
-----   Message from Alfonse Spruce, [place] sent at 05/08/2016  9:14 AM EST ----- -Potassium level is low. Stop taking Lasix medication. Potassium is important for heart health. You have been prescribed a potassium supplement. Will need to recheck levels again after potassium is completed. -HgbA1c is 15. HgbA1c gives an average of your blood sugar levels over the last 3 months. Goal is to lower your HgbA1c levels to 7 or below. . -Microalbumin/creatinine ratio level was elevated. This tests for protein in your urine that could indicate early signs of kidney damage. You have been prescribed lisinopril.  - BNP was normal. BNP is used to test check for heart damage.

## 2016-05-08 NOTE — Telephone Encounter (Signed)
CMA call to go over lab results no answer No VM set up

## 2016-05-09 NOTE — Telephone Encounter (Signed)
-----   Message from Alfonse Spruce, [place] sent at 05/08/2016  9:14 AM EST ----- -Potassium level is low. Stop taking Lasix medication. Potassium is important for heart health. You have been prescribed a potassium supplement. Will need to recheck levels again after potassium is completed. -HgbA1c is 15. HgbA1c gives an average of your blood sugar levels over the last 3 months. Goal is to lower your HgbA1c levels to 7 or below. . -Microalbumin/creatinine ratio level was elevated. This tests for protein in your urine that could indicate early signs of kidney damage. You have been prescribed lisinopril.  - BNP was normal. BNP is used to test check for heart damage.

## 2016-05-09 NOTE — Telephone Encounter (Signed)
CMA second attempt call to go over lab results   Patient did not answer & no Vm set up yet

## 2016-05-16 ENCOUNTER — Ambulatory Visit: Payer: Self-pay | Admitting: Pharmacist

## 2016-05-16 NOTE — Progress Notes (Deleted)
    S:     No chief complaint on file.   Patient arrives ***.  Presents for diabetes evaluation, education, and management at the request of ***. Patient was referred on ***.  Patient was last seen by Primary Care Provider on ***.   Patient reports Diabetes was diagnosed in ***.   Family/Social History:   Patient {Actions; denies-reports:120008} adherence with medications.  Current diabetes medications include: Novolog 70/30 26 units BID and metformin 1000 mg BID Current hypertension medications include:   Patient {Actions; denies-reports:120008} hypoglycemic events.  Patient reported dietary habits: Eats *** meals/day Breakfast:*** Lunch:*** Dinner:*** Snacks:*** Drinks:***  Patient reported exercise habits:    Patient {Actions; denies-reports:120008} nocturia.  Patient {Actions; denies-reports:120008} neuropathy. Patient {Actions; denies-reports:120008} visual changes. Patient {Actions; denies-reports:120008} self foot exams.   [place]:  Physical Exam   ROS   Lab Results  Component Value Date   HGBA1C >15 04/28/2016   There were no vitals filed for this visit.  Home fasting CBG: ***  2 hour post-prandial/random CBG: ***.  10 year ASCVD risk: ***.  A/P: Diabetes longstanding/newly diagnosed currently ***. Patient {Actions; denies-reports:120008} hypoglycemic events and is able to verbalize appropriate hypoglycemia management plan. Patient {Actions; denies-reports:120008} adherence with medication. Control is suboptimal due to ***.  PICK 1 OF THE FOLLOWING 4 (DELETE the others AND THEN DELETE THIS LINE OF TEXT)  {Meds adjust:18428} basal insulin Lantus (insulin glargine). Patient will continue to titrate 1 unit/day if fasting CBGs > 100mg /dl until fasting CBGs reach goal or next visit.    {Meds adjust:18428} basal insulin Lantus (insulin glargine) to ***. {Meds adjust:18428} rapid insulin Novolog (insulin aspart) to ***.   {Meds adjust:18428}  Victoza (liraglutide) to ***.   {Meds VVKPQA:44975} Trulicity (dulaglutide) to ***.   {Meds adjust:18428} Jardiance (empagliflozin) to ***.  Next A1C anticipated ***.    ASCVD risk greater than 7.5%. {Meds adjust:18428} Aspirin *** mg and {Meds adjust:18428} ***statin *** mg.   Hypertension longstanding/newly diagnosed currently ***.  Patient {Actions; denies-reports:120008} adherence with medication. Control is suboptimal due to ***.  Written patient instructions provided.  Total time in face to face counseling *** minutes.   Follow up in Pharmacist Clinic Visit ***.   Patient seen with ***

## 2016-05-23 ENCOUNTER — Encounter (HOSPITAL_COMMUNITY): Payer: Self-pay | Admitting: Emergency Medicine

## 2016-05-23 ENCOUNTER — Emergency Department (HOSPITAL_COMMUNITY)
Admission: EM | Admit: 2016-05-23 | Discharge: 2016-05-23 | Disposition: A | Discharge status: Home / Self Care | Payer: Self-pay | Attending: Emergency Medicine | Admitting: Emergency Medicine

## 2016-05-23 ENCOUNTER — Emergency Department (HOSPITAL_COMMUNITY): Payer: Self-pay

## 2016-05-23 DIAGNOSIS — E119 Type 2 diabetes mellitus without complications: Secondary | ICD-10-CM | POA: Insufficient documentation

## 2016-05-23 DIAGNOSIS — Z794 Long term (current) use of insulin: Secondary | ICD-10-CM | POA: Insufficient documentation

## 2016-05-23 DIAGNOSIS — Z79899 Other long term (current) drug therapy: Secondary | ICD-10-CM | POA: Insufficient documentation

## 2016-05-23 DIAGNOSIS — I10 Essential (primary) hypertension: Secondary | ICD-10-CM | POA: Insufficient documentation

## 2016-05-23 DIAGNOSIS — L03116 Cellulitis of left lower limb: Secondary | ICD-10-CM | POA: Insufficient documentation

## 2016-05-23 LAB — BASIC METABOLIC PANEL
Anion gap: 11 (ref 5–15)
BUN: 9 mg/dL (ref 6–20)
CHLORIDE: 101 mmol/L (ref 101–111)
CO2: 21 mmol/L — ABNORMAL LOW (ref 22–32)
Calcium: 8.8 mg/dL — ABNORMAL LOW (ref 8.9–10.3)
Creatinine, Ser: 0.49 mg/dL (ref 0.44–1.00)
GFR calc Af Amer: >60 mL/min (ref >60)
GFR calc non Af Amer: >60 mL/min (ref >60)
Glucose, Bld: 302 mg/dL — ABNORMAL HIGH (ref 65–99)
Potassium: 4 mmol/L (ref 3.5–5.1)
SODIUM: 133 mmol/L — AB (ref 135–145)

## 2016-05-23 LAB — CBC WITH DIFFERENTIAL/PLATELET
Basophils Absolute: 0 10*3/uL (ref 0.0–0.1)
Basophils Relative: 0 %
Eosinophils Absolute: 0 10*3/uL (ref 0.0–0.7)
Eosinophils Relative: 0 %
HCT: 32.8 % — ABNORMAL LOW (ref 36.0–46.0)
HEMOGLOBIN: 11 g/dL — AB (ref 12.0–15.0)
LYMPHS ABS: 2.6 10*3/uL (ref 0.7–4.0)
Lymphocytes Relative: 18 %
MCH: 30.1 pg (ref 26.0–34.0)
MCHC: 33.5 g/dL (ref 30.0–36.0)
MCV: 89.6 fL (ref 78.0–100.0)
Monocytes Absolute: 1.2 10*3/uL — ABNORMAL HIGH (ref 0.1–1.0)
Monocytes Relative: 8 %
Neutro Abs: 10.4 10*3/uL — ABNORMAL HIGH (ref 1.7–7.7)
Neutrophils Relative %: 74 %
Platelets: 233 10*3/uL (ref 150–400)
RBC: 3.66 MIL/uL — AB (ref 3.87–5.11)
RDW: 12.7 % (ref 11.5–15.5)
WBC: 14.1 10*3/uL — AB (ref 4.0–10.5)

## 2016-05-23 LAB — SEDIMENTATION RATE: SED RATE: 87 mm/h — AB (ref 0–22)

## 2016-05-23 LAB — C-REACTIVE PROTEIN: CRP: 5.9 mg/dL — AB (ref <1.0)

## 2016-05-23 MED ORDER — SULFAMETHOXAZOLE-TRIMETHOPRIM 800-160 MG PO TABS
1.0000 | ORAL_TABLET | Freq: Once | ORAL | Status: AC
  Administered 2016-05-23: 1 via ORAL
  Filled 2016-05-23: qty 1

## 2016-05-23 MED ORDER — SULFAMETHOXAZOLE-TRIMETHOPRIM 800-160 MG PO TABS: 1.0000 | ORAL_TABLET | Freq: Two times a day (BID) | ORAL | 0 refills | Status: AC

## 2016-05-23 MED ORDER — CLINDAMYCIN PHOSPHATE 900 MG/50ML IV SOLN: 900.0000 mg | Freq: Once | INTRAVENOUS | Status: DC

## 2016-05-23 MED ORDER — CEPHALEXIN 500 MG PO CAPS: 500.0000 mg | ORAL_CAPSULE | Freq: Two times a day (BID) | ORAL | 0 refills | Status: AC

## 2016-05-23 MED ORDER — CEPHALEXIN 250 MG PO CAPS
500.0000 mg | ORAL_CAPSULE | Freq: Once | ORAL | Status: AC
  Administered 2016-05-23: 500 mg via ORAL
  Filled 2016-05-23: qty 2

## 2016-05-23 NOTE — ED Notes (Addendum)
Pt verbalized understanding of d/c instructions and has no further questions. Pt is stable, A&Ox4, VSS. Pt understands she has to pick up her antibiotics tomorrow from her clinic. Pt states verbalized she will come back if s/s continue over the next 3 days

## 2016-05-23 NOTE — ED Notes (Signed)
Pt is asking for meal.

## 2016-05-23 NOTE — Care Management (Signed)
Patient is actively a patient at the [facility]. ED  CM contacted Clinic CM and Pharmacist regarding assisting patient  with obtaining antibiotics prescribed in the ED. CM Updated EDP. Prescriptions faxed to [facility]. [phone]

## 2016-05-23 NOTE — ED Notes (Signed)
Patient transported to X-ray 

## 2016-05-23 NOTE — ED Provider Notes (Signed)
[facility] DEPT Provider Note   CSN: [id number] Arrival date & time: 05/23/16  1503     History   Chief Complaint Chief Complaint  Patient presents with  . Foot Pain    HPI Gina [name] is a 56 y.o. female.  HPI  56 year old female with history of hypertension and diabetes who presents with left foot swelling. Patient's daughter accompanies her. States that when she was over at the patient's house this morning, she noticed that the patient had a metal pushpin stuck in the bottom of her foot. Patient is unsure when she stepped on the pin, and may been walking around on it for days. Patient's daughter removed the pin, but noticed that her foot was red and swollen so brought her in for evaluation. Patient states that she's been poorly compliant with her metformin in the past, but recently restarted it about 2-3 weeks ago. Denies trauma or injury to the left foot in the past. States that she doesn't feel pain in her left foot, but that it feels swollen. Denies fevers, chills, nausea, vomiting. States that both of her feet and lower legs are swollen at baseline, but that this is the first time that her left foot has swollen more compared to the right.   Past Medical History:  Diagnosis Date  . Diabetes mellitus   . Hypertension     Patient Active Problem List   Diagnosis Date Noted  . Essential hypertension 02/17/2014  . Colon cancer screening 02/17/2014  . Breast cancer screening 02/17/2014  . Diabetes ([place]) 07/28/2013  . Cholelithiases 07/28/2013  . DKA (diabetic ketoacidoses) ([place]) 06/18/2013  . Abdominal pain 06/18/2013  . HTN (hypertension) 06/18/2013  . Cellulitis of female genitalia 08/03/2012  . Type 2 diabetes mellitus ([place]) 08/03/2012  . Hyperglycemia 07/31/2012  . Candidal skin infection 07/31/2012  . Cellulitis and abscess 07/31/2012  . Anemia 07/31/2012    Past Surgical History:  Procedure Laterality Date  . TUBAL LIGATION      OB History    No data  available       Home Medications    Prior to Admission medications   Medication Sig Start Date End Date Taking? Authorizing Provider  escitalopram (LEXAPRO) 10 MG tablet Take 1 tablet (10 mg total) by mouth daily. After 1 week may increase take 2 tablets (20 mg total) by mouth daily. Patient taking differently: Take 10 mg by mouth daily.  04/28/16  Yes Alfonse Spruce, FNP  glucose blood test strip Use as instructed 04/28/16  Yes Alfonse Spruce, FNP  glucose monitoring kit (FREESTYLE) monitoring kit Use glucometer to check sugar three times a day 02/17/14  Yes Tresa Garter, MD  Lancets 30G MISC Use to check blood sugar three times a day 02/17/14  Yes Tresa Garter, MD  metFORMIN (GLUCOPHAGE-XR) 500 MG 24 hr tablet Take 2 tablets (1,000 mg total) by mouth 2 (two) times daily. 04/28/16  Yes Alfonse Spruce, FNP  Multiple Vitamins-Minerals (ONE-A-DAY WOMENS 50+ ADVANTAGE) TABS Take 1 tablet by mouth daily.   Yes Historical Provider, MD  Syringe/Needle, Disp, (PRODIGY SAFETY SYRINGES) 29G X 1/2" 1 ML MISC Use syringe to inject insulin twice a day 02/17/14  Yes Olugbemiga E Doreene Burke, MD  cephALEXin (KEFLEX) 500 MG capsule Take 1 capsule (500 mg total) by mouth 2 (two) times daily. 05/23/16 05/30/16  Jenifer Algernon Huxley, MD  famotidine (PEPCID) 40 MG tablet Take 1 tablet (40 mg total) by mouth daily. 02/17/14   Olugbemiga Essie Christine,  MD  furosemide (LASIX) 20 MG tablet Take 1 tablet (20 mg total) by mouth daily. Patient not taking: Reported on 05/23/2016 04/28/16   Alfonse Spruce, FNP  insulin aspart protamine- aspart (NOVOLOG MIX 70/30) (70-30) 100 UNIT/ML injection Inject 0.26 mLs (26 Units total) into the skin 2 (two) times daily with a meal. Patient not taking: Reported on 05/23/2016 02/17/14   Tresa Garter, MD  lisinopril (PRINIVIL,ZESTRIL) 2.5 MG tablet Take 1 tablet (2.5 mg total) by mouth daily. Patient not taking: Reported on 05/23/2016 05/08/16   Alfonse Spruce,  FNP  ondansetron (ZOFRAN) 4 MG tablet Take 1 tablet (4 mg total) by mouth every 8 (eight) hours as needed for nausea or vomiting. Patient not taking: Reported on 05/23/2016 04/01/16   Gwenyth Allegra Tegeler, MD  potassium chloride (K-DUR) 10 MEQ tablet Take 1 tablet (10 mEq total) by mouth 2 (two) times daily. Patient not taking: Reported on 05/23/2016 05/08/16   Alfonse Spruce, FNP  sulfamethoxazole-trimethoprim (BACTRIM DS,SEPTRA DS) 800-160 MG tablet Take 1 tablet by mouth 2 (two) times daily. 05/23/16 05/30/16  Jenifer Algernon Huxley, MD    Family History Family History  Problem Relation Age of Onset  . Diabetes type II Father   . CAD Father   . Prostate cancer Father   . Diabetes Mellitus II Brother     Social History Social History  Substance Use Topics  . Smoking status: Never Smoker  . Smokeless tobacco: Not on file  . Alcohol use No     Comment: occasionally     Allergies   Metformin and related   Review of Systems Review of Systems  Constitutional: Negative for chills and fever.  HENT: Negative for congestion, rhinorrhea and sore throat.   Eyes: Negative for visual disturbance.  Respiratory: Negative for cough, shortness of breath and wheezing.   Cardiovascular: Positive for leg swelling. Negative for chest pain and palpitations.  Gastrointestinal: Negative for abdominal distention, abdominal pain, diarrhea, nausea and vomiting.  Genitourinary: Negative for dysuria, flank pain and frequency.  Musculoskeletal: Negative for arthralgias, back pain, gait problem, joint swelling, myalgias, neck pain and neck stiffness.  Skin: Positive for color change (L foot). Negative for rash.  Neurological: Positive for numbness (bilateral feet). Negative for dizziness, tremors, syncope, facial asymmetry, speech difficulty, weakness and headaches.  Psychiatric/Behavioral: Negative for agitation, behavioral problems and confusion.     Physical Exam Updated Vital Signs BP 165/96    Pulse 96   Temp 98.6 F (37 C) (Oral)   Resp 18   SpO2 99%   Physical Exam  Constitutional: She is oriented to person, place, and time. She appears well-developed and well-nourished. No distress.  HENT:  Head: Normocephalic and atraumatic.  Right Ear: External ear normal.  Left Ear: External ear normal.  Nose: Nose normal.  Mouth/Throat: Oropharynx is clear and moist. No oropharyngeal exudate.  Eyes: Conjunctivae and EOM are normal. Pupils are equal, round, and reactive to light. Right eye exhibits no discharge. Left eye exhibits no discharge.  Neck: Normal range of motion. Neck supple.  Cardiovascular: Normal rate, regular rhythm and normal heart sounds.  Exam reveals no gallop and no friction rub.   No murmur heard. Palpable DP and PT pulses on the right, dopplerable triphasic DP and PT pulses on the left.   Pulmonary/Chest: Breath sounds normal. No respiratory distress. She has no wheezes. She has no rales.  Abdominal: Soft. Bowel sounds are normal. She exhibits no distension. There is no tenderness. There is  no guarding.  Musculoskeletal: Normal range of motion. She exhibits no tenderness (No TTP of the bilateral LEs).  2+ pitting edema to the RLE up to the mid-calf. 3+ pitting edema to the left foot and LLE up to the mid-calf, with overlying warmth and erythema. Puncture wound to the bottom of the L foot, no drainage.   Neurological: She is alert and oriented to person, place, and time. She exhibits normal muscle tone.  Decreased sensation to light touch in the sock-like distribution to the bilateral feet  Skin: Skin is warm and dry. She is not diaphoretic. There is erythema (L foot).  Psychiatric: She has a normal mood and affect. Her behavior is normal. Judgment and thought content normal.     ED Treatments / Results  Labs (all labs ordered are listed, but only abnormal results are displayed) Labs Reviewed  CBC WITH DIFFERENTIAL/PLATELET - Abnormal; Notable for the following:        Result Value   WBC 14.1 (*)    RBC 3.66 (*)    Hemoglobin 11.0 (*)    HCT 32.8 (*)    Neutro Abs 10.4 (*)    Monocytes Absolute 1.2 (*)    All other components within normal limits  BASIC METABOLIC PANEL - Abnormal; Notable for the following:    Sodium 133 (*)    CO2 21 (*)    Glucose, Bld 302 (*)    Calcium 8.8 (*)    All other components within normal limits  SEDIMENTATION RATE - Abnormal; Notable for the following:    Sed Rate 87 (*)    All other components within normal limits  C-REACTIVE PROTEIN - Abnormal; Notable for the following:    CRP 5.9 (*)    All other components within normal limits    EKG  EKG Interpretation None       Radiology Dg Foot Complete Left  Result Date: 05/23/2016 CLINICAL DATA:  Stepped on hand several days ago with swelling and redness, history of diabetes EXAM: LEFT FOOT - COMPLETE 3+ VIEW COMPARISON:  Left foot films of 11/13/2005 FINDINGS: No opaque foreign body is seen. No focal demineralization or erosion is evident to indicate active osteomyelitis. There has been progression of degenerative joint disease involving the left first MTP joint with loss of joint space, sclerosis, spurring, and subchondral cyst formation. No erosion is seen. There is a small plantar calcaneal degenerative spur present. IMPRESSION: 1. No radiographic evidence of osteomyelitis is seen. 2. 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 Electronically Signed   By: Ivar Drape M.D.   On: 05/23/2016 16:50    Procedures Procedures (including critical care time)  Medications Ordered in ED Medications  sulfamethoxazole-trimethoprim (BACTRIM DS,SEPTRA DS) 800-160 MG per tablet 1 tablet (1 tablet Oral Given 05/23/16 2033)  cephALEXin (KEFLEX) capsule 500 mg (500 mg Oral Given 05/23/16 2033)     Initial Impression / Assessment and Plan / ED Course  I have reviewed the triage vital signs and the nursing notes.  Pertinent labs &  imaging results that were available during my care of the patient were reviewed by me and considered in my medical decision making (see chart for details).     Afebrile and hemodynamically stable. Patient has obvious swelling with erythema to the left foot. Small puncture wound visible to the bottom of the left foot, with no drainage. She has dopplerable triphasic pulses to the left foot. Given history of diabetes and puncture wound, I'm concerned for  infection.  CBC with leukocytosis of 14.1. X-ray with no radiographic evidence of osteomyelitis. ESR elevated to 87, and CRP mildly elevated at 5.9.  I had an extensive discussion with the patient at bedside explaining to her that her diabetes places her at greater risk of deep space infection, potentially invading the bone. Patient was given the option for admission to the hospital for IV antibiotics. Instead, she prefers to be discharged with oral antibiotics and follow up with her PCP in 3 days for recheck. I feel that this is reasonable, but I stressed to her the importance that she return immediately for worsening erythema, swelling, or fevers. I've explained that if any of these symptoms occur or if her swelling does not improve with antibiotics, she will require MRI to rule out osteomyelitis.  Patient's case was discussed with Mariann Laster from case management, who has faxed the patient's prescriptions over to her community medical provider where they will be filled free of charge for her to pick up in the morning. First doses of Bactrim and Keflex given in the emergency department to allow for coverage of MRSA as well as strep in the interim. Patient discharged with strict return precautions.  Care of patient overseen by my attending, Dr. Kathrynn Humble.   Final Clinical Impressions(s) / ED Diagnoses   Final diagnoses:  Cellulitis of left lower extremity    New Prescriptions Discharge Medication List as of 05/23/2016  9:01 PM    START taking these  medications   Details  cephALEXin (KEFLEX) 500 MG capsule Take 1 capsule (500 mg total) by mouth 2 (two) times daily., Starting Tue 05/23/2016, Until Tue 05/30/2016, Print    sulfamethoxazole-trimethoprim (BACTRIM DS,SEPTRA DS) 800-160 MG tablet Take 1 tablet by mouth 2 (two) times daily., Starting Tue 05/23/2016, Until Tue 05/30/2016, Print         Jenifer Algernon Huxley, MD 05/24/16 1548    Varney Biles, MD 05/24/16 1528

## 2016-05-23 NOTE — Discharge Instructions (Signed)
Please follow-up with your primary care physician in 3 days. If your symptoms are not better at that time, or if you develop worsening swelling, pain, fevers, or chills, you will need to return to the Emergency Department for an MRI of your foot to rule out infection of the bone.

## 2016-05-23 NOTE — ED Notes (Signed)
Pt stated she felt hot. Temp 98.6

## 2016-05-23 NOTE — ED Notes (Signed)
Lab at bedside

## 2016-05-23 NOTE — ED Triage Notes (Addendum)
Pts daughter reports pt has not had sensation in left foot x3 days, pts foot swollen, warm and red to touch, daughter reports pt stepped on a thumb tack a few days ago and was unaware. Unable to locate pedal pulses on pts left foot

## 2016-05-23 NOTE — ED Notes (Signed)
Pt given sandwich and diet sprite

## 2016-05-24 MED FILL — SULFAMETHOXAZOLE/TMP DS TAB: 800-160 | 7 days supply | Qty: 14 | Fill #0

## 2016-05-24 MED FILL — CEPHALEXIN 500 MG CAPSULE: 500 | 7 days supply | Qty: 14 | Fill #0

## 2016-06-01 ENCOUNTER — Emergency Department (HOSPITAL_COMMUNITY): Payer: Self-pay

## 2016-06-01 ENCOUNTER — Inpatient Hospital Stay (HOSPITAL_COMMUNITY)
Admission: EM | Admit: 2016-06-01 | Discharge: 2016-06-07 | DRG: 616 | Disposition: A | Discharge status: Home Health | Payer: Self-pay | Attending: Internal Medicine | Admitting: Internal Medicine

## 2016-06-01 ENCOUNTER — Encounter (HOSPITAL_COMMUNITY): Payer: Self-pay | Admitting: Emergency Medicine

## 2016-06-01 ENCOUNTER — Inpatient Hospital Stay (HOSPITAL_COMMUNITY): Payer: Self-pay

## 2016-06-01 DIAGNOSIS — L02612 Cutaneous abscess of left foot: Secondary | ICD-10-CM | POA: Diagnosis present

## 2016-06-01 DIAGNOSIS — Z79899 Other long term (current) drug therapy: Secondary | ICD-10-CM

## 2016-06-01 DIAGNOSIS — E1169 Type 2 diabetes mellitus with other specified complication: Principal | ICD-10-CM | POA: Diagnosis present

## 2016-06-01 DIAGNOSIS — F329 Major depressive disorder, single episode, unspecified: Secondary | ICD-10-CM | POA: Diagnosis present

## 2016-06-01 DIAGNOSIS — E1100 Type 2 diabetes mellitus with hyperosmolarity without nonketotic hyperglycemic-hyperosmolar coma (NKHHC): Secondary | ICD-10-CM

## 2016-06-01 DIAGNOSIS — M868X7 Other osteomyelitis, ankle and foot: Secondary | ICD-10-CM | POA: Diagnosis present

## 2016-06-01 DIAGNOSIS — Z6823 Body mass index (BMI) 23.0-23.9, adult: Secondary | ICD-10-CM

## 2016-06-01 DIAGNOSIS — M7989 Other specified soft tissue disorders: Secondary | ICD-10-CM

## 2016-06-01 DIAGNOSIS — E11622 Type 2 diabetes mellitus with other skin ulcer: Secondary | ICD-10-CM

## 2016-06-01 DIAGNOSIS — E1140 Type 2 diabetes mellitus with diabetic neuropathy, unspecified: Secondary | ICD-10-CM | POA: Diagnosis present

## 2016-06-01 DIAGNOSIS — Z794 Long term (current) use of insulin: Secondary | ICD-10-CM

## 2016-06-01 DIAGNOSIS — E440 Moderate protein-calorie malnutrition: Secondary | ICD-10-CM | POA: Insufficient documentation

## 2016-06-01 DIAGNOSIS — D638 Anemia in other chronic diseases classified elsewhere: Secondary | ICD-10-CM | POA: Diagnosis present

## 2016-06-01 DIAGNOSIS — I10 Essential (primary) hypertension: Secondary | ICD-10-CM | POA: Diagnosis present

## 2016-06-01 DIAGNOSIS — M869 Osteomyelitis, unspecified: Secondary | ICD-10-CM

## 2016-06-01 DIAGNOSIS — E1165 Type 2 diabetes mellitus with hyperglycemia: Secondary | ICD-10-CM | POA: Diagnosis present

## 2016-06-01 DIAGNOSIS — M009 Pyogenic arthritis, unspecified: Secondary | ICD-10-CM | POA: Diagnosis present

## 2016-06-01 DIAGNOSIS — L03032 Cellulitis of left toe: Secondary | ICD-10-CM

## 2016-06-01 DIAGNOSIS — Z833 Family history of diabetes mellitus: Secondary | ICD-10-CM

## 2016-06-01 DIAGNOSIS — L03116 Cellulitis of left lower limb: Secondary | ICD-10-CM | POA: Diagnosis present

## 2016-06-01 DIAGNOSIS — L03119 Cellulitis of unspecified part of limb: Secondary | ICD-10-CM

## 2016-06-01 DIAGNOSIS — Z8249 Family history of ischemic heart disease and other diseases of the circulatory system: Secondary | ICD-10-CM

## 2016-06-01 DIAGNOSIS — E1151 Type 2 diabetes mellitus with diabetic peripheral angiopathy without gangrene: Secondary | ICD-10-CM | POA: Diagnosis present

## 2016-06-01 DIAGNOSIS — Z9114 Patient's other noncompliance with medication regimen: Secondary | ICD-10-CM

## 2016-06-01 DIAGNOSIS — E119 Type 2 diabetes mellitus without complications: Secondary | ICD-10-CM

## 2016-06-01 DIAGNOSIS — E11628 Type 2 diabetes mellitus with other skin complications: Secondary | ICD-10-CM

## 2016-06-01 DIAGNOSIS — E43 Unspecified severe protein-calorie malnutrition: Secondary | ICD-10-CM | POA: Diagnosis present

## 2016-06-01 DIAGNOSIS — R809 Proteinuria, unspecified: Secondary | ICD-10-CM

## 2016-06-01 LAB — CBC WITH DIFFERENTIAL/PLATELET
Basophils Absolute: 0 10*3/uL (ref 0.0–0.1)
Basophils Relative: 0 %
Eosinophils Absolute: 0 10*3/uL (ref 0.0–0.7)
Eosinophils Relative: 0 %
HEMATOCRIT: 37.2 % (ref 36.0–46.0)
Hemoglobin: 12.3 g/dL (ref 12.0–15.0)
Lymphocytes Relative: 14 %
Lymphs Abs: 1.9 10*3/uL (ref 0.7–4.0)
MCH: 29.4 pg (ref 26.0–34.0)
MCHC: 33.1 g/dL (ref 30.0–36.0)
MCV: 88.8 fL (ref 78.0–100.0)
MONO ABS: 0.8 10*3/uL (ref 0.1–1.0)
Monocytes Relative: 6 %
Neutro Abs: 10.7 10*3/uL — ABNORMAL HIGH (ref 1.7–7.7)
Neutrophils Relative %: 80 %
Platelets: 423 10*3/uL — ABNORMAL HIGH (ref 150–400)
RBC: 4.19 MIL/uL (ref 3.87–5.11)
RDW: 12.5 % (ref 11.5–15.5)
WBC: 13.4 10*3/uL — ABNORMAL HIGH (ref 4.0–10.5)

## 2016-06-01 LAB — URIC ACID: Uric Acid, Serum: 4.1 mg/dL (ref 2.3–6.6)

## 2016-06-01 LAB — COMPREHENSIVE METABOLIC PANEL
ALK PHOS: 98 U/L (ref 38–126)
ALT: 18 U/L (ref 14–54)
ANION GAP: 11 (ref 5–15)
AST: 18 U/L (ref 15–41)
Albumin: 3.2 g/dL — ABNORMAL LOW (ref 3.5–5.0)
BUN: 13 mg/dL (ref 6–20)
CALCIUM: 9.4 mg/dL (ref 8.9–10.3)
CO2: 27 mmol/L (ref 22–32)
Chloride: 98 mmol/L — ABNORMAL LOW (ref 101–111)
Creatinine, Ser: 0.57 mg/dL (ref 0.44–1.00)
GFR calc Af Amer: >60 mL/min (ref >60)
Glucose, Bld: 325 mg/dL — ABNORMAL HIGH (ref 65–99)
POTASSIUM: 3.6 mmol/L (ref 3.5–5.1)
Sodium: 136 mmol/L (ref 135–145)
Total Bilirubin: 0.7 mg/dL (ref 0.3–1.2)
Total Protein: 9.3 g/dL — ABNORMAL HIGH (ref 6.5–8.1)

## 2016-06-01 LAB — I-STAT CG4 LACTIC ACID, ED: LACTIC ACID, VENOUS: 1.23 mmol/L (ref 0.5–1.9)

## 2016-06-01 LAB — C-REACTIVE PROTEIN: CRP: 5.3 mg/dL — ABNORMAL HIGH (ref <1.0)

## 2016-06-01 LAB — GLUCOSE, CAPILLARY: Glucose-Capillary: 368 mg/dL — ABNORMAL HIGH (ref 65–99)

## 2016-06-01 LAB — SEDIMENTATION RATE: Sed Rate: 114 mm/hr — ABNORMAL HIGH (ref 0–22)

## 2016-06-01 MED ORDER — SENNOSIDES-DOCUSATE SODIUM 8.6-50 MG PO TABS: 1.0000 | ORAL_TABLET | Freq: Every evening | ORAL | Status: DC | PRN

## 2016-06-01 MED ORDER — MORPHINE SULFATE (PF) 4 MG/ML IV SOLN
4.0000 mg | INTRAVENOUS | Status: AC | PRN
Start: 2016-06-01 — End: 2016-06-05
  Administered 2016-06-01 – 2016-06-05 (×3): 4 mg via INTRAVENOUS
  Filled 2016-06-01 (×3): qty 1

## 2016-06-01 MED ORDER — METRONIDAZOLE IN NACL 5-0.79 MG/ML-% IV SOLN: 500.0000 mg | Freq: Three times a day (TID) | INTRAVENOUS | Status: DC

## 2016-06-01 MED ORDER — VANCOMYCIN HCL IN DEXTROSE 750-5 MG/150ML-% IV SOLN: 750.0000 mg | Freq: Two times a day (BID) | INTRAVENOUS | Status: DC

## 2016-06-01 MED ORDER — METRONIDAZOLE IN NACL 5-0.79 MG/ML-% IV SOLN
500.0000 mg | Freq: Three times a day (TID) | INTRAVENOUS | Status: DC
  Administered 2016-06-02 – 2016-06-07 (×15): 500 mg via INTRAVENOUS
  Filled 2016-06-01 (×15): qty 100

## 2016-06-01 MED ORDER — INSULIN GLARGINE 100 UNIT/ML ~~LOC~~ SOLN
20.0000 [IU] | Freq: Every day | SUBCUTANEOUS | Status: DC
  Administered 2016-06-01: 20 [IU] via SUBCUTANEOUS
  Filled 2016-06-01: qty 0.2

## 2016-06-01 MED ORDER — INSULIN ASPART 100 UNIT/ML ~~LOC~~ SOLN: 0.0000 [IU] | Freq: Three times a day (TID) | SUBCUTANEOUS | Status: DC

## 2016-06-01 MED ORDER — ESCITALOPRAM OXALATE 10 MG PO TABS
10.0000 mg | ORAL_TABLET | Freq: Every day | ORAL | Status: DC
Start: 2016-06-01 — End: 2016-06-07
  Administered 2016-06-01 – 2016-06-07 (×6): 10 mg via ORAL
  Filled 2016-06-01 (×6): qty 1

## 2016-06-01 MED ORDER — DEXTROSE 5 % IV SOLN: 2.0000 g | INTRAVENOUS | Status: DC

## 2016-06-01 MED ORDER — HYDROCODONE-ACETAMINOPHEN 5-325 MG PO TABS
1.0000 | ORAL_TABLET | ORAL | Status: DC | PRN
  Administered 2016-06-01: 1 via ORAL
  Administered 2016-06-02 – 2016-06-04 (×7): 2 via ORAL
  Administered 2016-06-06: 1 via ORAL
  Administered 2016-06-06 – 2016-06-07 (×2): 2 via ORAL
  Administered 2016-06-07: 1 via ORAL
  Administered 2016-06-07: 2 via ORAL
  Filled 2016-06-01 (×4): qty 2
  Filled 2016-06-01: qty 1
  Filled 2016-06-01 (×2): qty 2
  Filled 2016-06-01: qty 1
  Filled 2016-06-01 (×2): qty 2
  Filled 2016-06-01 (×2): qty 1
  Filled 2016-06-01 (×2): qty 2

## 2016-06-01 MED ORDER — VANCOMYCIN HCL IN DEXTROSE 750-5 MG/150ML-% IV SOLN
750.0000 mg | Freq: Two times a day (BID) | INTRAVENOUS | Status: DC
  Administered 2016-06-02 – 2016-06-03 (×4): 750 mg via INTRAVENOUS
  Filled 2016-06-01 (×5): qty 150

## 2016-06-01 MED ORDER — VANCOMYCIN HCL IN DEXTROSE 1-5 GM/200ML-% IV SOLN
1000.0000 mg | Freq: Once | INTRAVENOUS | Status: AC
  Administered 2016-06-01: 1000 mg via INTRAVENOUS
  Filled 2016-06-01: qty 200

## 2016-06-01 MED ORDER — PIPERACILLIN-TAZOBACTAM 3.375 G IVPB
3.3750 g | Freq: Once | INTRAVENOUS | Status: AC
  Administered 2016-06-01: 3.375 g via INTRAVENOUS
  Filled 2016-06-01: qty 50

## 2016-06-01 MED ORDER — ONDANSETRON HCL 4 MG/2ML IJ SOLN
4.0000 mg | Freq: Once | INTRAMUSCULAR | Status: AC
  Administered 2016-06-01: 4 mg via INTRAVENOUS
  Filled 2016-06-01: qty 2

## 2016-06-01 MED ORDER — INSULIN ASPART 100 UNIT/ML ~~LOC~~ SOLN
0.0000 [IU] | Freq: Every day | SUBCUTANEOUS | Status: DC
  Administered 2016-06-01: 5 [IU] via SUBCUTANEOUS
  Administered 2016-06-02: 4 [IU] via SUBCUTANEOUS
  Administered 2016-06-04 – 2016-06-06 (×2): 2 [IU] via SUBCUTANEOUS

## 2016-06-01 MED ORDER — ADULT MULTIVITAMIN W/MINERALS CH
1.0000 | ORAL_TABLET | Freq: Every day | ORAL | Status: DC
  Administered 2016-06-02 – 2016-06-07 (×4): 1 via ORAL
  Filled 2016-06-01 (×4): qty 1

## 2016-06-01 MED ORDER — LISINOPRIL 5 MG PO TABS
2.5000 mg | ORAL_TABLET | Freq: Every day | ORAL | Status: DC
  Administered 2016-06-02 – 2016-06-07 (×5): 2.5 mg via ORAL
  Filled 2016-06-01 (×5): qty 1

## 2016-06-01 MED ORDER — ONE-A-DAY WOMENS 50+ ADVANTAGE PO TABS: 1.0000 | ORAL_TABLET | Freq: Every day | ORAL | Status: DC

## 2016-06-01 MED ORDER — ENOXAPARIN SODIUM 40 MG/0.4ML ~~LOC~~ SOLN
40.0000 mg | SUBCUTANEOUS | Status: DC
  Administered 2016-06-01 – 2016-06-06 (×6): 40 mg via SUBCUTANEOUS
  Filled 2016-06-01 (×6): qty 0.4

## 2016-06-01 MED ORDER — SODIUM CHLORIDE 0.9 % IV SOLN
INTRAVENOUS | Status: AC
  Administered 2016-06-01: 21:00:00 via INTRAVENOUS

## 2016-06-01 MED ORDER — DEXTROSE 5 % IV SOLN
2.0000 g | INTRAVENOUS | Status: DC
  Administered 2016-06-01 – 2016-06-06 (×6): 2 g via INTRAVENOUS
  Filled 2016-06-01 (×7): qty 2

## 2016-06-01 NOTE — Clinical Social Work Note (Signed)
Clinical Social Work Assessment  Patient Details  Name: Gina [name] MRN: [id number] Date of Birth: Oct 01, 1960  Date of referral:  06/01/16               Reason for consult:  Medication Concerns                Permission sought to share information with:  Family Supports, Chartered certified accountant granted to share information::  Yes, Verbal Permission Granted  Name::        Agency::     Relationship::     Contact Information:     Housing/Transportation Living arrangements for the past 2 months:  Single Family Home Source of Information:  Patient Patient Interpreter Needed:  None Criminal Activity/Legal Involvement Pertinent to Current Situation/Hospitalization:    Significant Relationships:  Adult Children, Friend Lives with:  Self Do you feel safe going back to the place where you live?  Yes Need for family participation in patient care:  No (Coment)  Care giving concerns: None listed by pt/family    Social Worker assessment / plan:  CSW met with pt and confirmed pt's plan to be discharged to her apartment to live at discharge.  CSW provided active listening and validated pt's concerns about food and need for new glasses.  Pt has been living independently prior to being admitted to [facility] at times with her adult son in her home with her.  CSW gave pt resources for food banks that supply and/or deliver food and provided education on attaining referrals.  Pt requested information on prescription glasses and CSW stated case manager would be by to see the pt before D/C.  Employment status:  Disabled (Comment on whether or not currently receiving Disability) (Pt has pending disability claim) Insurance information:  Self Pay (Medicaid Pending) PT Recommendations:  Not assessed at this time Information / Referral to community resources:     Patient/Family's Response to care:  Patient alert and oriented.  Patient agreeable to plan.  Pt's son, daughter and a friend listed  on facesheet supportive and strongly involved in pt.'s care.  Pt.'s pleasant and appreciated CSW intervention.   Patient/Family's Understanding of and Emotional Response to Diagnosis, Current Treatment, and Prognosis:  Still assessing.  Pt's family not present at bedside.  Emotional Assessment Appearance:  Appears stated age Attitude/Demeanor/Rapport:    Affect (typically observed):  Adaptable, Accepting, Calm, Pleasant Orientation:  Oriented to Self, Oriented to Place, Oriented to  Time, Oriented to Situation Alcohol / Substance use:    Psych involvement (Current and /or in the community):     Discharge Needs  Concerns to be addressed:   (Concerns about food resources) Readmission within the last 30 days:  No Current discharge risk:  None Barriers to Discharge:  No Barriers Identified   Claudine Mouton, LCSWA 06/01/2016, 11:07 PM

## 2016-06-01 NOTE — Progress Notes (Signed)
Consult request has been received. CSW following up at present time.  Alphonse Guild. Erionna Strum, Latanya Presser, LCAS Clinical Social Worker Ph: [phone]

## 2016-06-01 NOTE — ED Notes (Signed)
Bed: WA02 Expected date:  Expected time:  Means of arrival:  Comments: EMS-hold

## 2016-06-01 NOTE — ED Provider Notes (Signed)
[facility] DEPT Provider Note   CSN: [id number] Arrival date & time: 06/01/16  1434     History   Chief Complaint Chief Complaint  Patient presents with  . Cellulitis    HPI Gina [name] is a 56 y.o. female. Chief complaint is foot pain swelling and redness.  HPI:  Patient has history of diabetes, and leg cellulitis. Seen and evaluated on 313, 9 days ago. Diagnosed with cellulitis. Has elevated white blood cell count and inflammatory markers but x-ray not showing ostial. Admission was recommended but patient declined. Has finished 9 days antibiotics. Worsening pain redness and swelling she presents here. Denies fever. No shakes or chills.  She is insulin dependent diabetic.  No history of gout.  Past Medical History:  Diagnosis Date  . Diabetes mellitus   . Hypertension     Patient Active Problem List   Diagnosis Date Noted  . Essential hypertension 02/17/2014  . Colon cancer screening 02/17/2014  . Breast cancer screening 02/17/2014  . Diabetes ([place]) 07/28/2013  . Cholelithiases 07/28/2013  . DKA (diabetic ketoacidoses) ([place]) 06/18/2013  . Abdominal pain 06/18/2013  . HTN (hypertension) 06/18/2013  . Cellulitis of female genitalia 08/03/2012  . Type 2 diabetes mellitus ([place]) 08/03/2012  . Hyperglycemia 07/31/2012  . Candidal skin infection 07/31/2012  . Cellulitis and abscess 07/31/2012  . Anemia 07/31/2012    Past Surgical History:  Procedure Laterality Date  . TUBAL LIGATION      OB History    No data available       Home Medications    Prior to Admission medications   Medication Sig Start Date End Date Taking? Authorizing Provider  escitalopram (LEXAPRO) 10 MG tablet Take 1 tablet (10 mg total) by mouth daily. After 1 week may increase take 2 tablets (20 mg total) by mouth daily. Patient taking differently: Take 10 mg by mouth daily.  04/28/16   Alfonse Spruce, FNP  famotidine (PEPCID) 40 MG tablet Take 1 tablet (40 mg total) by mouth  daily. 02/17/14   Tresa Garter, MD  furosemide (LASIX) 20 MG tablet Take 1 tablet (20 mg total) by mouth daily. Patient not taking: Reported on 05/23/2016 04/28/16   Alfonse Spruce, FNP  glucose blood test strip Use as instructed 04/28/16   Alfonse Spruce, FNP  glucose monitoring kit (FREESTYLE) monitoring kit Use glucometer to check sugar three times a day 02/17/14   Tresa Garter, MD  insulin aspart protamine- aspart (NOVOLOG MIX 70/30) (70-30) 100 UNIT/ML injection Inject 0.26 mLs (26 Units total) into the skin 2 (two) times daily with a meal. Patient not taking: Reported on 05/23/2016 02/17/14   Tresa Garter, MD  Lancets 30G MISC Use to check blood sugar three times a day 02/17/14   Tresa Garter, MD  lisinopril (PRINIVIL,ZESTRIL) 2.5 MG tablet Take 1 tablet (2.5 mg total) by mouth daily. Patient not taking: Reported on 05/23/2016 05/08/16   Alfonse Spruce, FNP  metFORMIN (GLUCOPHAGE-XR) 500 MG 24 hr tablet Take 2 tablets (1,000 mg total) by mouth 2 (two) times daily. 04/28/16   Alfonse Spruce, FNP  Multiple Vitamins-Minerals (ONE-A-DAY WOMENS 50+ ADVANTAGE) TABS Take 1 tablet by mouth daily.    Historical Provider, MD  ondansetron (ZOFRAN) 4 MG tablet Take 1 tablet (4 mg total) by mouth every 8 (eight) hours as needed for nausea or vomiting. Patient not taking: Reported on 05/23/2016 04/01/16   Gwenyth Allegra Tegeler, MD  potassium chloride (K-DUR) 10 MEQ tablet Take 1  tablet (10 mEq total) by mouth 2 (two) times daily. Patient not taking: Reported on 05/23/2016 05/08/16   Alfonse Spruce, FNP  Syringe/Needle, Disp, (PRODIGY SAFETY SYRINGES) 29G X 1/2" 1 ML MISC Use syringe to inject insulin twice a day 02/17/14   Tresa Garter, MD    Family History Family History  Problem Relation Age of Onset  . Diabetes type II Father   . CAD Father   . Prostate cancer Father   . Diabetes Mellitus II Brother     Social History Social History  Substance Use  Topics  . Smoking status: Never Smoker  . Smokeless tobacco: Not on file  . Alcohol use No     Comment: occasionally     Allergies   Metformin and related   Review of Systems Review of Systems  Constitutional: Negative for appetite change, chills, diaphoresis, fatigue and fever.  HENT: Negative for mouth sores, sore throat and trouble swallowing.   Eyes: Negative for visual disturbance.  Respiratory: Negative for cough, chest tightness, shortness of breath and wheezing.   Cardiovascular: Negative for chest pain.  Gastrointestinal: Negative for abdominal distention, abdominal pain, diarrhea, nausea and vomiting.  Endocrine: Negative for polydipsia, polyphagia and polyuria.  Genitourinary: Negative for dysuria, frequency and hematuria.  Musculoskeletal: Negative for gait problem.  Skin: Negative for color change, pallor and rash.       Redness swelling and pain of the left foot primarily at the left great toe but extending onto the forefoot.  Neurological: Negative for dizziness, syncope, light-headedness and headaches.  Hematological: Does not bruise/bleed easily.  Psychiatric/Behavioral: Negative for behavioral problems and confusion.     Physical Exam Updated Vital Signs BP 135/83   Pulse 77   Temp 98.7 F (37.1 C) (Oral)   Resp 17   Ht _0  (1.575 m)   Wt 128 lb (58.1 kg)   SpO2 100%   BMI 23.41 kg/m   Physical Exam  Constitutional: She is oriented to person, place, and time. She appears well-developed and well-nourished. No distress.  HENT:  Head: Normocephalic.  Eyes: Conjunctivae are normal. Pupils are equal, round, and reactive to light. No scleral icterus.  Neck: Normal range of motion. Neck supple. No thyromegaly present.  Cardiovascular: Normal rate and regular rhythm.  Exam reveals no gallop and no friction rub.   No murmur heard. Pulmonary/Chest: Effort normal and breath sounds normal. No respiratory distress. She has no wheezes. She has no rales.    Abdominal: Soft. Bowel sounds are normal. She exhibits no distension. There is no tenderness. There is no rebound.  Musculoskeletal: Normal range of motion.  Neurological: She is alert and oriented to person, place, and time.  Skin: Skin is warm and dry. No rash noted.   Redness swelling and pain of the left foot primarily at the left great toe but extending onto the forefoot. Palpable DP, and PT pulses with excellent capillary refill     Psychiatric: She has a normal mood and affect. Her behavior is normal.     ED Treatments / Results  Labs (all labs ordered are listed, but only abnormal results are displayed) Labs Reviewed  CBC WITH DIFFERENTIAL/PLATELET - Abnormal; Notable for the following:       Result Value   WBC 13.4 (*)    Platelets 423 (*)    Neutro Abs 10.7 (*)    All other components within normal limits  COMPREHENSIVE METABOLIC PANEL - Abnormal; Notable for the following:    Chloride  98 (*)    Glucose, Bld 325 (*)    Total Protein 9.3 (*)    Albumin 3.2 (*)    All other components within normal limits  CULTURE, BLOOD (SINGLE)  URIC ACID  CREATININE, SERUM  I-STAT CG4 LACTIC ACID, ED    EKG  EKG Interpretation None       Radiology Dg Foot Complete Left  Result Date: 06/01/2016 CLINICAL DATA:  Pain and swelling. History of penetrating wound in the plantar aspect of the foot. EXAM: LEFT FOOT - COMPLETE 3+ VIEW COMPARISON:  05/23/2016 FINDINGS: Advanced degenerative changes involving the first metatarsal phalangeal joint with mild hallux valgus deformity. There are progressive erosive changes. Erosions are also noted involving the second, third and fourth MTP joints. Findings could be due to erosive arthropathy. Gout would be a consideration. Since the prior x-rays of 05/23/2016 there has been interval development of joint space narrowing involving the second MTP joint along with possible progressive destructive changes involving the proximal phalanx and possible  pathologic fracture. Plain film findings are concerning for second MTP joint septic arthritis and osteomyelitis. MRI without with contrast is suggested for further evaluation. No significant midfoot or visualized hindfoot abnormalities. IMPRESSION: 1. Plain film findings worrisome for septic arthritis involving the second MTP joint pain and osteomyelitis involving the second proximal phalanx. Recommend MRI left foot without and with contrast for further evaluation. 2. 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  Gout would be a consideration. Electronically Signed   By: Marijo Sanes M.D.   On: 06/01/2016 15:20    Procedures Procedures (including critical care time)  Medications Ordered in ED Medications  morphine 4 MG/ML injection 4 mg (4 mg Intravenous Given 06/01/16 1529)  vancomycin (VANCOCIN) IVPB 1000 mg/200 mL premix (1,000 mg Intravenous New Bag/Given 06/01/16 1643)  vancomycin (VANCOCIN) IVPB 750 mg/150 ml premix (not administered)  piperacillin-tazobactam (ZOSYN) IVPB 3.375 g (0 g Intravenous Stopped 06/01/16 1702)  ondansetron (ZOFRAN) injection 4 mg (4 mg Intravenous Given 06/01/16 1529)     Initial Impression / Assessment and Plan / ED Course  I have reviewed the triage vital signs and the nursing notes.  Pertinent labs & imaging results that were available during my care of the patient were reviewed by me and considered in my medical decision making (see chart for details).     Recommend xrays, labs, IV Abx, admission.  Foot has redness and swelling primarily based around the first MTP joint. She has no history of gout. She's not on thiazide, or other uric acid  exacerbating medications.  Final Clinical Impressions(s) / ED Diagnoses   Final diagnoses:  Foot swelling  Cellulitis of left foot    New Prescriptions New Prescriptions   No medications on file     Tanna Furry, MD 06/01/16 1704

## 2016-06-01 NOTE — Progress Notes (Signed)
Pharmacy Antibiotic Follow-up Note  Gina [name] is a 56 y.o. year-old female admitted on 06/01/2016.  The patient is currently on day 1 of Vancomycin & Zosyn for LLE cellulitis. - prev Ed visit [facility] 3/13 LLE cellulitis related to stepping on pin-declined admission - home on Keflex/Bactrim. Likely non-compliant with abx, DM medications  Assessment/Plan: Vancomycin 1gm x1, then 750mg   IV every 12 hours.  Goal trough 15-20 mcg/mL.  Aiming for higher Vanc trough with continued LE cellulitis Ceftriaxone 2gm q24 Flagyl 500mg  IV q8  Temp (24hrs), Avg:98.7 F (37.1 C), Min:98.7 F (37.1 C), Max:98.7 F (37.1 C)  No results for input(s): WBC in the last 168 hours.  Invalid input(s):  CREATININE No results for input(s): CREATININE in the last 168 hours. Estimated Creatinine Clearance: 62.8 mL/min (by C-G formula based on SCr of 0.49 mg/dL).    Allergies  Allergen Reactions  . Metformin And Related Other (See Comments)    Upset stomach   Antimicrobials this admission: 3/22 Zosyn x1  3/22 Vanc >> 3/22 Rocephin >. 3/22 Flagyl >>   Levels/dose changes this admission:  Microbiology results: 3/22 BCx: x1 sent  Thank you for allowing pharmacy to be a part of this patient's care.  Minda Ditto PharmD Pager [phone] 06/01/2016, 6:36 PM

## 2016-06-01 NOTE — ED Notes (Signed)
Hospitalist at bedside 

## 2016-06-01 NOTE — ED Triage Notes (Signed)
Per [facility] pt c/o worsening lower left leg swelling. Pt was recently seen at [facility] and discharged with antibiotics, pt has not been compliant with. Reports worsening swelling.

## 2016-06-01 NOTE — ED Notes (Signed)
Pt transported to MRI 

## 2016-06-01 NOTE — H&P (Signed)
History and Physical    Gina [name] RWE:[id number] DOB: Jan 27, 1961 DOA: 06/01/2016  I have briefly reviewed the patient's prior medical records in [place]  PCP: Fredia Beets, FNP  Patient coming from: Home  Chief Complaint: Left foot infection  HPI: Gina [name] is a 56 y.o. female with medical history significant of poorly controlled diabetes mellitus with most recent hemoglobin A1c of greater than 15, hypertension, presents to the emergency room with chief complaint of left foot swelling and redness, as well as pain with ambulation.  Patient has a history of type 2 diabetes mellitus, however has been homeless up until about couple of months ago and has been without her medications for quite some time.  She has recently established care with [facility] and has been taking her insulin since.  Over the last couple weeks, she has been complaining of left foot swelling which has been progressive.  She was in the emergency room about 8 days ago, she was evaluated and placed on oral antibiotics and sent home.  Despite taking oral antibiotics, her swelling has progressed, has gotten worse, and her pain has increased into her left foot.  She denies any systemic symptoms, she has no fever or chills, she has no abdominal pain, no nausea, vomiting or diarrhea.  She denies any chest pain or shortness of breath.  ED Course: In the emergency room her vital signs are stable, she has mild leukocytosis a white count of 13, her CBGs is elevated into the 300s, her lactic acid is 1.2 and otherwise her blood work is unremarkable.  She underwent a plain foot x-ray which showed findings concerning for septic arthritis/osteomyelitis.  TRH is asked for admission for severe diabetic foot infection.  Review of Systems: As per HPI otherwise 10 point review of systems negative.   Past Medical History:  Diagnosis Date  . Diabetes mellitus   . Hypertension     Past  Surgical History:  Procedure Laterality Date  . TUBAL LIGATION       reports that she has never smoked. She does not have any smokeless tobacco history on file. She reports that she does not drink alcohol or use drugs.  Allergies  Allergen Reactions  . Metformin And Related Other (See Comments)    Upset stomach    Family History  Problem Relation Age of Onset  . Diabetes type II Father   . CAD Father   . Prostate cancer Father   . Diabetes Mellitus II Brother     Prior to Admission medications   Medication Sig Start Date End Date Taking? Authorizing Provider  escitalopram (LEXAPRO) 10 MG tablet Take 1 tablet (10 mg total) by mouth daily. After 1 week may increase take 2 tablets (20 mg total) by mouth daily. Patient taking differently: Take 10 mg by mouth daily.  04/28/16   Alfonse Spruce, FNP  famotidine (PEPCID) 40 MG tablet Take 1 tablet (40 mg total) by mouth daily. 02/17/14   Tresa Garter, MD  furosemide (LASIX) 20 MG tablet Take 1 tablet (20 mg total) by mouth daily. Patient not taking: Reported on 05/23/2016 04/28/16   Alfonse Spruce, FNP  glucose blood test strip Use as instructed 04/28/16   Alfonse Spruce, FNP  glucose monitoring kit (FREESTYLE) monitoring kit Use glucometer to check sugar three times a day 02/17/14   Tresa Garter, MD  insulin aspart protamine- aspart (NOVOLOG MIX 70/30) (70-30) 100 UNIT/ML injection Inject 0.26  mLs (26 Units total) into the skin 2 (two) times daily with a meal. Patient not taking: Reported on 05/23/2016 02/17/14   Tresa Garter, MD  Lancets 30G MISC Use to check blood sugar three times a day 02/17/14   Tresa Garter, MD  lisinopril (PRINIVIL,ZESTRIL) 2.5 MG tablet Take 1 tablet (2.5 mg total) by mouth daily. Patient not taking: Reported on 05/23/2016 05/08/16   Alfonse Spruce, FNP  metFORMIN (GLUCOPHAGE-XR) 500 MG 24 hr tablet Take 2 tablets (1,000 mg total) by mouth 2 (two) times daily. 04/28/16    Alfonse Spruce, FNP  Multiple Vitamins-Minerals (ONE-A-DAY WOMENS 50+ ADVANTAGE) TABS Take 1 tablet by mouth daily.    Historical Provider, MD  ondansetron (ZOFRAN) 4 MG tablet Take 1 tablet (4 mg total) by mouth every 8 (eight) hours as needed for nausea or vomiting. Patient not taking: Reported on 05/23/2016 04/01/16   Gwenyth Allegra Tegeler, MD  potassium chloride (K-DUR) 10 MEQ tablet Take 1 tablet (10 mEq total) by mouth 2 (two) times daily. Patient not taking: Reported on 05/23/2016 05/08/16   Alfonse Spruce, FNP  Syringe/Needle, Disp, (PRODIGY SAFETY SYRINGES) 29G X 1/2" 1 ML MISC Use syringe to inject insulin twice a day 02/17/14   Tresa Garter, MD    Physical Exam: Vitals:   06/01/16 1458 06/01/16 1523 06/01/16 1630 06/01/16 1700  BP:  (!) 172/98 126/73 135/83  Pulse:  80 79 77  Resp:  16 18 17   Temp:      TempSrc:      SpO2:  100% 99% 100%  Weight: 58.1 kg (128 lb)     Height: 5' 2"  (1.575 m)         Constitutional: NAD, calm, comfortable Vitals:   06/01/16 1458 06/01/16 1523 06/01/16 1630 06/01/16 1700  BP:  (!) 172/98 126/73 135/83  Pulse:  80 79 77  Resp:  16 18 17   Temp:      TempSrc:      SpO2:  100% 99% 100%  Weight: 58.1 kg (128 lb)     Height: 5' 2"  (1.575 m)      Eyes: PERRL, lids and conjunctivae normal ENMT: Mucous membranes are moist. Posterior pharynx clear of any exudate or lesions.Normal dentition.  Neck: normal, supple, no masses, no thyromegaly Respiratory: clear to auscultation bilaterally, no wheezing, no crackles. Normal respiratory effort. No accessory muscle use.  Cardiovascular: Regular rate and rhythm, no murmurs / rubs / gallops. No extremity edema. 2+ pedal pulses.  Abdomen: no tenderness, no masses palpated. Bowel sounds positive.  Musculoskeletal: no clubbing / cyanosis. Normal muscle tone.  Skin: Significant swelling of her left foot, left toes as well as erythematous changes on the dorsum aspect of her foot extending  towards the ankle.  Tender to palpation Neurologic: CN 2-12 grossly intact. Strength 5/5 in all 4.  Psychiatric: Normal judgment and insight. Alert and oriented x 3. Normal mood.   Labs on Admission: I have personally reviewed following labs and imaging studies  CBC:  Recent Labs Lab 06/01/16 1526  WBC 13.4*  NEUTROABS 10.7*  HGB 12.3  HCT 37.2  MCV 88.8  PLT 315*   Basic Metabolic Panel:  Recent Labs Lab 06/01/16 1526  NA 136  K 3.6  CL 98*  CO2 27  GLUCOSE 325*  BUN 13  CREATININE 0.57  CALCIUM 9.4   GFR: Estimated Creatinine Clearance: 62.8 mL/min (by C-G formula based on SCr of 0.57 mg/dL). Liver Function Tests:  Recent Labs  Lab 06/01/16 1526  AST 18  ALT 18  ALKPHOS 98  BILITOT 0.7  PROT 9.3*  ALBUMIN 3.2*   No results for input(s): LIPASE, AMYLASE in the last 168 hours. No results for input(s): AMMONIA in the last 168 hours. Coagulation Profile: No results for input(s): INR, PROTIME in the last 168 hours. Cardiac Enzymes: No results for input(s): CKTOTAL, CKMB, CKMBINDEX, TROPONINI in the last 168 hours. BNP (last 3 results) No results for input(s): PROBNP in the last 8760 hours. HbA1C: No results for input(s): HGBA1C in the last 72 hours. CBG: No results for input(s): GLUCAP in the last 168 hours. Lipid Profile: No results for input(s): CHOL, HDL, LDLCALC, TRIG, CHOLHDL, LDLDIRECT in the last 72 hours. Thyroid Function Tests: No results for input(s): TSH, T4TOTAL, FREET4, T3FREE, THYROIDAB in the last 72 hours. Anemia Panel: No results for input(s): VITAMINB12, FOLATE, FERRITIN, TIBC, IRON, RETICCTPCT in the last 72 hours. Urine analysis:    Component Value Date/Time   COLORURINE YELLOW 04/01/2016 1051   APPEARANCEUR HAZY (A) 04/01/2016 1051   LABSPEC 1.032 (H) 04/01/2016 1051   PHURINE 6.0 04/01/2016 1051   GLUCOSEU >=500 (A) 04/01/2016 1051   HGBUR SMALL (A) 04/01/2016 1051   BILIRUBINUR N 04/28/2016 1603   KETONESUR 20 (A) 04/01/2016  1051   PROTEINUR N 04/28/2016 1603   PROTEINUR 30 (A) 04/01/2016 1051   UROBILINOGEN 0.2 04/28/2016 1603   UROBILINOGEN 0.2 06/18/2013 0650   NITRITE p 04/28/2016 1603   NITRITE POSITIVE (A) 04/01/2016 1051   LEUKOCYTESUR Negative 04/28/2016 1603     Radiological Exams on Admission: Dg Foot Complete Left  Result Date: 06/01/2016 CLINICAL DATA:  Pain and swelling. History of penetrating wound in the plantar aspect of the foot. EXAM: LEFT FOOT - COMPLETE 3+ VIEW COMPARISON:  05/23/2016 FINDINGS: Advanced degenerative changes involving the first metatarsal phalangeal joint with mild hallux valgus deformity. There are progressive erosive changes. Erosions are also noted involving the second, third and fourth MTP joints. Findings could be due to erosive arthropathy. Gout would be a consideration. Since the prior x-rays of 05/23/2016 there has been interval development of joint space narrowing involving the second MTP joint along with possible progressive destructive changes involving the proximal phalanx and possible pathologic fracture. Plain film findings are concerning for second MTP joint septic arthritis and osteomyelitis. MRI without with contrast is suggested for further evaluation. No significant midfoot or visualized hindfoot abnormalities. IMPRESSION: 1. Plain film findings worrisome for septic arthritis involving the second MTP joint pain and osteomyelitis involving the second proximal phalanx. Recommend MRI left foot without and with contrast for further evaluation. 2. 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  Gout would be a consideration. Electronically Signed   By: Marijo Sanes M.D.   On: 06/01/2016 15:20    Assessment/Plan Active Problems:   Type 2 diabetes mellitus ([place])   Essential hypertension   Cellulitis in diabetic foot ([place])   Diabetic foot infection -We will obtain an MRI of the foot to further characterize how deep the infection is.  Based on results, she probably needs an  orthopedic surgery consultation in the morning.  Will obtain sedimentation rate and CRP.  Cellulitis focused orders used, antibiotics per protocol with vancomycin, ceftriaxone and metronidazole.  We will get ABIs as well  Type 2 diabetes mellitus -Most recent hemoglobin A1c was greater than 15 a month ago.  CBGs on presentation are quite elevated, which may be in the setting of an active infection.  Place patient on Lantus, sliding scale -Consult  diabetes educator  Hypertension -Resume home medications   DVT prophylaxis: Lovenox Code Status: Full code Family Communication: No family at bedside Disposition Plan: Admit to MedSurg Consults called: None    Admission status: Inpatient   At the time of admission, it appears that the appropriate admission status for this patient is INPATIENT. This is judged to be reasonable and necessary in order to provide the required high service intensity to ensure the patient's safety given the presenting symptoms, physical exam findings, and initial radiographic and laboratory data in the context of their chronic comorbidities. Current circumstances of significant and severe foot infection are felt to place patient at high risk for further clinical deterioration threatening life, limb, or organ. Moreover, it is my clinical judgment that the patient will require inpatient hospital care spanning beyond 2 midnights from the point of admission and that early discharge would result in unnecessary risk of decompensation and readmission or threat to life, limb or bodily function.   Marzetta Board, MD [facility] Pager [phone]  If 7PM-7AM, please contact night-coverage [email or web address] Password [facility]  06/01/2016, 5:13 PM

## 2016-06-01 NOTE — ED Notes (Signed)
Patient transported to X-ray 

## 2016-06-02 ENCOUNTER — Inpatient Hospital Stay (HOSPITAL_COMMUNITY): Payer: Self-pay

## 2016-06-02 DIAGNOSIS — Z8631 Personal history of diabetic foot ulcer: Secondary | ICD-10-CM

## 2016-06-02 DIAGNOSIS — M7989 Other specified soft tissue disorders: Secondary | ICD-10-CM

## 2016-06-02 DIAGNOSIS — M00872 Arthritis due to other bacteria, left ankle and foot: Secondary | ICD-10-CM

## 2016-06-02 DIAGNOSIS — E1169 Type 2 diabetes mellitus with other specified complication: Principal | ICD-10-CM

## 2016-06-02 DIAGNOSIS — M869 Osteomyelitis, unspecified: Secondary | ICD-10-CM

## 2016-06-02 DIAGNOSIS — M009 Pyogenic arthritis, unspecified: Secondary | ICD-10-CM

## 2016-06-02 DIAGNOSIS — L0390 Cellulitis, unspecified: Secondary | ICD-10-CM

## 2016-06-02 DIAGNOSIS — M86172 Other acute osteomyelitis, left ankle and foot: Secondary | ICD-10-CM

## 2016-06-02 HISTORY — DX: Pyogenic arthritis, unspecified: M00.9

## 2016-06-02 LAB — COMPREHENSIVE METABOLIC PANEL
ALBUMIN: 2.4 g/dL — AB (ref 3.5–5.0)
ALK PHOS: 66 U/L (ref 38–126)
ALT: 15 U/L (ref 14–54)
ANION GAP: 7 (ref 5–15)
AST: 15 U/L (ref 15–41)
BUN: 15 mg/dL (ref 6–20)
CALCIUM: 9 mg/dL (ref 8.9–10.3)
CO2: 27 mmol/L (ref 22–32)
Chloride: 101 mmol/L (ref 101–111)
Creatinine, Ser: 0.47 mg/dL (ref 0.44–1.00)
GFR calc Af Amer: >60 mL/min (ref >60)
GFR calc non Af Amer: >60 mL/min (ref >60)
GLUCOSE: 287 mg/dL — AB (ref 65–99)
POTASSIUM: 4 mmol/L (ref 3.5–5.1)
SODIUM: 135 mmol/L (ref 135–145)
Total Bilirubin: 0.5 mg/dL (ref 0.3–1.2)
Total Protein: 7.2 g/dL (ref 6.5–8.1)

## 2016-06-02 LAB — CBC
HEMATOCRIT: 32.2 % — AB (ref 36.0–46.0)
Hemoglobin: 10.7 g/dL — ABNORMAL LOW (ref 12.0–15.0)
MCH: 29.6 pg (ref 26.0–34.0)
MCHC: 33.2 g/dL (ref 30.0–36.0)
MCV: 89.2 fL (ref 78.0–100.0)
Platelets: 373 10*3/uL (ref 150–400)
RBC: 3.61 MIL/uL — ABNORMAL LOW (ref 3.87–5.11)
RDW: 12.7 % (ref 11.5–15.5)
WBC: 13 10*3/uL — ABNORMAL HIGH (ref 4.0–10.5)

## 2016-06-02 LAB — GLUCOSE, CAPILLARY
Glucose-Capillary: 272 mg/dL — ABNORMAL HIGH (ref 65–99)
Glucose-Capillary: 193 mg/dL — ABNORMAL HIGH (ref 65–99)
Glucose-Capillary: 193 mg/dL — ABNORMAL HIGH (ref 65–99)
Glucose-Capillary: 358 mg/dL — ABNORMAL HIGH (ref 65–99)

## 2016-06-02 LAB — HIV ANTIBODY (ROUTINE TESTING W REFLEX): HIV Screen 4th Generation wRfx: NONREACTIVE

## 2016-06-02 MED ORDER — SALINE SPRAY 0.65 % NA SOLN
1.0000 | NASAL | Status: DC | PRN
  Administered 2016-06-02 – 2016-06-04 (×2): 1 via NASAL
  Filled 2016-06-02 (×2): qty 44

## 2016-06-02 MED ORDER — INSULIN GLARGINE 100 UNIT/ML ~~LOC~~ SOLN
30.0000 [IU] | Freq: Every day | SUBCUTANEOUS | Status: DC
  Administered 2016-06-02 – 2016-06-06 (×5): 30 [IU] via SUBCUTANEOUS
  Filled 2016-06-02 (×6): qty 0.3

## 2016-06-02 MED ORDER — INSULIN NPH (HUMAN) (ISOPHANE) 100 UNIT/ML ~~LOC~~ SUSP
10.0000 [IU] | Freq: Once | SUBCUTANEOUS | Status: AC
  Administered 2016-06-02: 10 [IU] via SUBCUTANEOUS
  Filled 2016-06-02: qty 10

## 2016-06-02 MED ORDER — LIVING WELL WITH DIABETES BOOK
Freq: Once | Status: AC
  Administered 2016-06-02: 1
  Filled 2016-06-02: qty 1

## 2016-06-02 MED ORDER — INSULIN ASPART 100 UNIT/ML ~~LOC~~ SOLN
0.0000 [IU] | Freq: Three times a day (TID) | SUBCUTANEOUS | Status: DC
  Administered 2016-06-02: 8 [IU] via SUBCUTANEOUS
  Administered 2016-06-02 (×2): 3 [IU] via SUBCUTANEOUS
  Administered 2016-06-03 (×2): 8 [IU] via SUBCUTANEOUS
  Administered 2016-06-04: 5 [IU] via SUBCUTANEOUS
  Administered 2016-06-04 – 2016-06-05 (×2): 8 [IU] via SUBCUTANEOUS
  Administered 2016-06-06 (×2): 5 [IU] via SUBCUTANEOUS
  Administered 2016-06-06: 11 [IU] via SUBCUTANEOUS
  Administered 2016-06-07: 3 [IU] via SUBCUTANEOUS
  Administered 2016-06-07: 5 [IU] via SUBCUTANEOUS

## 2016-06-02 MED ORDER — GLUCERNA SHAKE PO LIQD
237.0000 mL | Freq: Three times a day (TID) | ORAL | Status: DC
  Administered 2016-06-02 – 2016-06-03 (×3): 237 mL via ORAL
  Administered 2016-06-03 (×2): via ORAL
  Administered 2016-06-04 – 2016-06-07 (×7): 237 mL via ORAL
  Filled 2016-06-02 (×19): qty 237

## 2016-06-02 NOTE — Progress Notes (Signed)
Initial Nutrition Assessment  DOCUMENTATION CODES:   Severe malnutrition in context of chronic illness  INTERVENTION:   Glucerna Shake po TID, each supplement provides 220 kcal and 10 grams of protein  MVI  NUTRITION DIAGNOSIS:   Malnutrition related to chronic illness, uncontrolled diabetes as evidenced by severe depletion of body fat, severe depletion of muscle mass, 12 percent weight loss in two months.  GOAL:   Patient will meet greater than or equal to 90% of their needs  MONITOR:   PO intake, Supplement acceptance, Labs, Weight trends, Skin  REASON FOR ASSESSMENT:   Consult Wound healing  ASSESSMENT:   56 y.o. female with medical history significant of poorly controlled diabetes mellitus with most recent hemoglobin A1c of greater than 15, hypertension, presents to the emergency room with chief complaint of left foot swelling and redness, as well as pain with ambulation.  Patient has a history of type 2 diabetes mellitus, however has been homeless up until about couple of months ago and has been without her medications for quite some time.  She has recently established care with [facility] and has been taking her insulin since.  Over the last couple weeks, she has been complaining of left foot swelling which has been progressive.  She was in the emergency room about 8 days ago, she was evaluated and placed on oral antibiotics and sent home.  Despite taking oral antibiotics, her swelling has progressed, has gotten worse, and her pain has increased into her left foot.    Met with pt in room today. Pt reports intermittent poor appetite and oral intake pta. Pt reports that she was recently homeless and had trouble finding food; pt reports that she just ate "whatever she could get her hands on". Pt also reports not taking her medications properly r/t cost and availability. Per chart, pt has lost 17lbs(12%) in two months which is severe. Pt has  diabetic cellulitis in her foot. RD discussed with pt the importance of adequate protein intake for wound healing. Pt reports that she does like Glucerna but that she can't always afford them. RD provided pt with some good protein options that were more cost efficient and easier to obtain in her situation. Pt has a lack of nutrition related knowledge when it comes to her diabetes. Pt confused as to what foods she can eat to gain weight that will not run up her blood sugars. RD provided diabetes education today and showed pt how to chose balanced meals from her menu; however, RD feels pt could benefit from additional education prior to discharge.   Medications reviewed and include: ceftriaxone, metronidazole, lovenox, insulin, MVI, vancomycin  Labs reviewed: alb 2.4(L) CRP- 5.3(H)-3/22 Wbc- 13(H) CBGS- 325, 287 x 24 hrs AIC >15(H) 04/2016  Nutrition-Focused physical exam completed. Findings are severe fat depletion in arms and chest, severe muscle depletion over entire body, and moderate edema in BLE.   Diet Order:  Diet Carb Modified Fluid consistency: Thin; Room service appropriate? Yes  Skin:  Wound (see comment) (cellulitits foot)  Last BM:  none since admit  Height:   Ht Readings from Last 1 Encounters:  06/01/16 5' 2"  (1.575 m)    Weight:   Wt Readings from Last 1 Encounters:  06/01/16 128 lb (58.1 kg)    Ideal Body Weight:  50 kg  BMI:  Body mass index is 23.41 kg/m.  Estimated Nutritional Needs:   Kcal:  1750-2050kcal/day   Protein:  87-99g/day  Fluid:  >1.7L/day   EDUCATION NEEDS:   Education needs addressed  Koleen Distance, RD, LDN Pager #[phone]

## 2016-06-02 NOTE — Progress Notes (Signed)
Nutrition Education Note  RD provided nutrition education regarding diabetes.   Lab Results  Component Value Date   HGBA1C >15 04/28/2016    RD discussed different food groups and their effects on blood sugar, emphasizing carbohydrate-containing foods. Provided list of carbohydrates and recommended serving sizes of common foods.  Discussed importance of controlled and consistent carbohydrate intake throughout the day. Provided examples of ways to balance meals/snacks and encouraged intake of high-fiber, whole grain complex carbohydrates. Teach back method used.  Expect poor compliance.  Body mass index is 23.41 kg/m. Pt meets criteria for normal based on current BMI.  Current diet order is carbohydrate controlled, patient is consuming approximately 100% of meals at this time. Labs and medications reviewed.  RD following this pt  Koleen Distance, RD, LDN Pager #[phone]
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
## 2016-06-02 NOTE — Progress Notes (Signed)
VASCULAR LAB PRELIMINARY  ARTERIAL  ABI completed: Within normal limits.    RIGHT    LEFT    PRESSURE WAVEFORM  PRESSURE WAVEFORM  BRACHIAL 94 Tri BRACHIAL 105 Tri         AT 105 Tri AT 118 Tri  PT 107 Tri PT 112 Tri                  RIGHT LEFT  ABI 1.02 1.12    Landry Mellow, RDMS, RVT   06/02/2016, 10:29 AM

## 2016-06-02 NOTE — Care Management Note (Signed)
Case Management Note  Patient Details  Name: Klynn Linnemann MRN: [id number] Date of Birth: [birth year]  Subjective/Objective:     cellulitis               Action/Plan:  Given Match program card and instruction on how to use.    Expected Discharge Date:   (unknown)               Expected Discharge Plan:  Home/Self Care  In-House Referral:     Discharge planning Services  CM Consult, [place] Program, Medication Assistance  Post Acute Care Choice:    Choice offered to:     DME Arranged:    DME Agency:     HH Arranged:    HH Agency:     Status of Service:  In process, will continue to follow  If discussed at Long Length of Stay Meetings, dates discussed:    Additional Comments:  Leeroy Cha, RN 06/02/2016, 10:18 AM

## 2016-06-02 NOTE — Consult Note (Signed)
Hospital Consult    Reason for Consult:  Left foot toe osteomyelitis Referring Physician:  Wynelle Cleveland MRN #:  [id number]  History of Present Illness: This is a 56 y.o. female here with swelling and redness of her left foot and has osteo of left 2nd toe. She has poorly controlled diabetes but is a non smoker. Recently her medical care was complicated by homelessness. She is having pain in her foot. Has been on abx and denies fevers and chills and is without systemic or constitutional symptoms. Does not take aspirin or statin. States that now she has a place to live.   Past Medical History:  Diagnosis Date  . Diabetes mellitus   . Hypertension     Past Surgical History:  Procedure Laterality Date  . TUBAL LIGATION      Allergies  Allergen Reactions  . Metformin And Related Other (See Comments)    Upset stomach    Prior to Admission medications   Medication Sig Start Date End Date Taking? Authorizing Provider  escitalopram (LEXAPRO) 10 MG tablet Take 1 tablet (10 mg total) by mouth daily. After 1 week may increase take 2 tablets (20 mg total) by mouth daily. Patient taking differently: Take 10 mg by mouth daily.  04/28/16  Yes Alfonse Spruce, FNP  metFORMIN (GLUCOPHAGE-XR) 500 MG 24 hr tablet Take 2 tablets (1,000 mg total) by mouth 2 (two) times daily. 04/28/16  Yes Alfonse Spruce, FNP  glucose blood test strip Use as instructed 04/28/16   Alfonse Spruce, FNP  glucose monitoring kit (FREESTYLE) monitoring kit Use glucometer to check sugar three times a day 02/17/14   Tresa Garter, MD  Lancets 30G MISC Use to check blood sugar three times a day 02/17/14   Tresa Garter, MD  Syringe/Needle, Disp, (PRODIGY SAFETY SYRINGES) 29G X 1/2" 1 ML MISC Use syringe to inject insulin twice a day 02/17/14   Tresa Garter, MD    Social History   Social History  . Marital status: Single    Spouse name: N/A  . Number of children: N/A  . Years of education: N/A    Occupational History  . Not on file.   Social History Main Topics  . Smoking status: Never Smoker  . Smokeless tobacco: Never Used  . Alcohol use No     Comment: occasionally  . Drug use: No  . Sexual activity: Not on file   Other Topics Concern  . Not on file   Social History Narrative  . No narrative on file     Family History  Problem Relation Age of Onset  . Diabetes type II Father   . CAD Father   . Prostate cancer Father   . Diabetes Mellitus II Brother     ROS: [x]  Positive   [ ]  Negative   [ ]  All sytems reviewed and are negative  Cardiovascular: []  chest pain/pressure []  palpitations []  SOB lying flat []  DOE []  pain in legs while walking [x]  pain in legs at rest []  pain in legs at night []  non-healing ulcers []  hx of DVT [x]  swelling in legs  Pulmonary: []  productive cough []  asthma/wheezing []  home O2  Neurologic: []  weakness in []  arms []  legs []  numbness in []  arms []  legs []  hx of CVA []  mini stroke [] difficulty speaking or slurred speech []  temporary loss of vision in one eye []  dizziness  Hematologic: []  hx of cancer []  bleeding problems []  problems with blood clotting easily  Endocrine:   []  diabetes []  thyroid disease  GI []  vomiting blood []  blood in stool  GU: []  CKD/renal failure []  HD--[]  M/W/F or []  T/T/S []  burning with urination []  blood in urine  Psychiatric: []  anxiety []  depression  Musculoskeletal: []  arthritis []  joint pain  Integumentary: []  rashes [x]  ulcers  Constitutional: []  fever []  chills   Physical Examination  Vitals:   06/02/16 1100 06/02/16 1342  BP: 101/70 101/65  Pulse: 74 84  Resp:  18  Temp:  98.3 F (36.8 C)   Body mass index is 23.41 kg/m.  General:  WDWN in NAD Gait: Not observed HENT: WNL, normocephalic Pulmonary: normal non-labored breathing Cardiac: palpable radial, femoral, popliteal, dp/pt bilaterally Abdomen: soft, NT/ND, no masses Extremities: dressing on  left foot is cdi Musculoskeletal: no muscle wasting or atrophy  Neurologic: A&O X 3; Appropriate Affect ; SENSATION: normal; MOTOR FUNCTION:  moving all extremities equally. Speech is fluent/normal Psychiatric:  Appropriate mood and affect  CBC    Component Value Date/Time   WBC 13.0 (H) 06/02/2016 0537   RBC 3.61 (L) 06/02/2016 0537   HGB 10.7 (L) 06/02/2016 0537   HCT 32.2 (L) 06/02/2016 0537   PLT 373 06/02/2016 0537   MCV 89.2 06/02/2016 0537   MCH 29.6 06/02/2016 0537   MCHC 33.2 06/02/2016 0537   RDW 12.7 06/02/2016 0537   LYMPHSABS 1.9 06/01/2016 1526   MONOABS 0.8 06/01/2016 1526   EOSABS 0.0 06/01/2016 1526   BASOSABS 0.0 06/01/2016 1526    BMET    Component Value Date/Time   NA 135 06/02/2016 0537   K 4.0 06/02/2016 0537   CL 101 06/02/2016 0537   CO2 27 06/02/2016 0537   GLUCOSE 287 (H) 06/02/2016 0537   BUN 15 06/02/2016 0537   CREATININE 0.47 06/02/2016 0537   CREATININE 0.68 04/28/2016 1640   CALCIUM 9.0 06/02/2016 0537   GFRNONAA >60 06/02/2016 0537   GFRNONAA >89 04/28/2016 1640   GFRAA >60 06/02/2016 0537   GFRAA >89 04/28/2016 1640    COAGS: No results found for: INR, PROTIME   Non-Invasive Vascular Imaging:   Arterial pressure indices:  +-----------------+---------+--------------+---------+ !Location         !Pressure !Brachial index!Waveform ! +-----------------+---------+--------------+---------+ !Right ant tibial !107 mm Hg!1.02          !Triphasic! +-----------------+---------+--------------+---------+ !Right post tibial!105 mm Hg!1             !Triphasic! +-----------------+---------+--------------+---------+ !Left ant tibial  !118 mm Hg!1.12          !Triphasic! +-----------------+---------+--------------+---------+ !Left post tibial !112 mm Hg!1.07          !Triphasic! +-----------------+---------+--------------+---------+  ------------------------------------------------------------------- Summary: Bilateral ABI within normal  limits.   IMPRESSION: 1. Plain film findings worrisome for septic arthritis involving the second MTP joint pain and osteomyelitis involving the second proximal phalanx. Recommend MRI left foot without and with contrast for further evaluation. 2. 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  Gout would be a consideration.  ASSESSMENT/PLAN: This is a 56 y.o. female here with wound of left 2nd toe with osteomyelitis. She has poorly controlled diabetes and a difficult social situation. She does have palpable pulses and abi that are 1 bilaterally suggesting small vessel disease from her diabetes. Should be treated for vascular disease with asa and statin but no vascular intervention prior to any foot interventions is needed.  Brandon C. Donzetta Matters, MD [facility] Office: [phone] Pager: [phone]

## 2016-06-02 NOTE — Progress Notes (Addendum)
Inpatient Diabetes Program Recommendations  AACE/ADA: New Consensus Statement on Inpatient Glycemic Control (2015)  Target Ranges:  Prepandial:   less than 140 mg/dL      Peak postprandial:   less than 180 mg/dL (1-2 hours)      Critically ill patients:  140 - 180 mg/dL   Review of Glycemic Control  Diabetes history: DM 2 Outpatient Diabetes medications: Metformin 1000 mg BID Current orders for Inpatient glycemic control: Lantus 30 units QHS, Novolog Moderate + HS scale  Patient established care with Fredia Beets FNP at the [facility] on 04/28/16. Patient had been without DM medications for 1 year when A1c level was drawn. Patient had been previously on 70/30 insulin, but was only prescribed Metformin 1000 mg BID. Unclear by FNP's note whether or not she wanted the patient to start back on 70/30 insulin. No changes noted to medication regimen after A1c level obtained when the call was placed to inform patient.  Inpatient Diabetes Program Recommendations:   May need to switch patient to 70/30 insulin unless patient is to be NPO (If NPO suggest Lantus still but reduced dose is  recommended if surgery is in the plan) due to cost issues and dosing for discharge. Patient reporting barriers to afford [employer] 70/30. May want to consider 70/30 22 units BID (30.8 units basal, 13.2 units of short acting) could eventually increase to dose listed previously in MAR 26 units BID (36.4 units basal, 15.6 units short acting).  Will touch base with patient today.  Thanks,  Tama Headings RN, MSN, [facility] Inpatient Diabetes Coordinator Team Pager [phone] (8a-5p)

## 2016-06-02 NOTE — Progress Notes (Addendum)
PROGRESS NOTE    Gina [name]   JKK:[id number]  DOB: Jul 13, 1960  DOA: 06/01/2016 PCP: Fredia Beets, FNP   Brief Narrative:  Gina [name] is a 56 y.o. female with medical history significant of poorly controlled diabetes mellitus with most recent hemoglobin A1c of greater than 15, hypertension, presents to the emergency room with chief complaint of left foot swelling and redness, as well as pain with ambulation.  Patient has a history of type 2 diabetes mellitus, however has been homeless up until about couple of months ago and has been without her medications for quite some time.    Over the last couple weeks, she has been complaining of left foot swelling which has been progressive.  She was in the emergency room about 8 days ago, she was evaluated and placed on 2 different oral antibiotics (bactrim and keflex per records) and sent home.  Despite taking oral antibiotics, her swelling has progressed and her pain has increased into her left foot.  She denies any systemic symptoms, she has no fever or chills, she has no abdominal pain, no nausea, vomiting or diarrhea.  She denies any chest pain or shortness of breath.  Subjective: No complains. Was only taking Metformin and Lexapro at home and no insulin.   Assessment & Plan:   Principal Problem:   Osteomyelitis due to type 2 diabetes mellitus    Septic arthritis of interphalangeal joint of toe of left foot - MRI report below- showing left 2nd toe septic arthritis and osteomyelitis and fluid collections in foot - consulted Dr Sharol Given, obtained ABIs (are normal) and on Dr Jess Barters requested, consulted vascular surgery as well - cont Vanc, Rocephin and Flagyl -f/u blood cultures  Active Problems:   Type 2 diabetes mellitus - severely uncontrolled with A1c > 15 (note at it was the same in 2015 when she was supposedly on Insulin) - has only been taking Metformin and not Insulin due to cost - sugar still high this AM, add one time dose of  NPH for this morning,  increase Lantus from 20 to 30 U QHS, cont SSI - discussed need for control in detail with patient    Essential hypertension  - Lisinopril added  Depression - cont Lexapro   DVT prophylaxis: Lovenox Code Status: Full code Family Communication:  Disposition Plan: follow on med/surg Consultants:   Ortho    Vascular sx Procedures:    Antimicrobials:  Anti-infectives    Start     Dose/Rate Route Frequency Ordered Stop   06/02/16 2100  metroNIDAZOLE (FLAGYL) IVPB 500 mg     500 mg 100 mL/hr over 60 Minutes Intravenous Every 8 hours 06/01/16 1834     06/02/16 0600  vancomycin (VANCOCIN) IVPB 750 mg/150 ml premix     750 mg 150 mL/hr over 60 Minutes Intravenous Every 12 hours 06/01/16 1832     06/02/16 0400  vancomycin (VANCOCIN) IVPB 750 mg/150 ml premix  Status:  Discontinued     750 mg 150 mL/hr over 60 Minutes Intravenous Every 12 hours 06/01/16 1624 06/01/16 1832   06/01/16 2000  cefTRIAXone (ROCEPHIN) 2 g in dextrose 5 % 50 mL IVPB     2 g 100 mL/hr over 30 Minutes Intravenous Every 24 hours 06/01/16 1834     06/01/16 1715  cefTRIAXone (ROCEPHIN) 2 g in dextrose 5 % 50 mL IVPB  Status:  Discontinued     2 g 100 mL/hr over 30 Minutes Intravenous Every 24 hours 06/01/16 1712 06/01/16 1834  06/01/16 1715  metroNIDAZOLE (FLAGYL) IVPB 500 mg  Status:  Discontinued     500 mg 100 mL/hr over 60 Minutes Intravenous Every 8 hours 06/01/16 1712 06/01/16 1834   06/01/16 1515  vancomycin (VANCOCIN) IVPB 1000 mg/200 mL premix     1,000 mg 200 mL/hr over 60 Minutes Intravenous  Once 06/01/16 1504 06/01/16 1816   06/01/16 1500  piperacillin-tazobactam (ZOSYN) IVPB 3.375 g     3.375 g 12.5 mL/hr over 240 Minutes Intravenous  Once 06/01/16 1458 06/01/16 1702       Objective: Vitals:   06/01/16 1730 06/01/16 1949 06/02/16 0526 06/02/16 1047  BP: (!) 139/93 131/75 124/83 101/70  Pulse: 88 82 79   Resp: 18 18 18    Temp:  98.8 F (37.1 C) 98 F (36.7 C)    TempSrc:  Oral Oral   SpO2: 99% 99% 100%   Weight:      Height:        Intake/Output Summary (Last 24 hours) at 06/02/16 1111 Last data filed at 06/01/16 1816  Gross per 24 hour  Intake              250 ml  Output                0 ml  Net              250 ml   Filed Weights   06/01/16 1458  Weight: 58.1 kg (128 lb)    Examination: General exam: Appears comfortable  HEENT: PERRLA, oral mucosa moist, no sclera icterus or thrush Respiratory system: Clear to auscultation. Respiratory effort normal. Cardiovascular system: S1 & S2 heard, RRR.  No murmurs  Gastrointestinal system: Abdomen soft, non-tender, nondistended. Normal bowel sound. No organomegaly Central nervous system: Alert and oriented. No focal neurological deficits. Extremities: No cyanosis, clubbing - 2+ edema left foot and toes with erythema and tenderness Skin: No ulcers Psychiatry:  Mood & affect appropriate.     Data Reviewed: I have personally reviewed following labs and imaging studies  CBC:  Recent Labs Lab 06/01/16 1526 06/02/16 0537  WBC 13.4* 13.0*  NEUTROABS 10.7*  --   HGB 12.3 10.7*  HCT 37.2 32.2*  MCV 88.8 89.2  PLT 423* 027   Basic Metabolic Panel:  Recent Labs Lab 06/01/16 1526 06/02/16 0537  NA 136 135  K 3.6 4.0  CL 98* 101  CO2 27 27  GLUCOSE 325* 287*  BUN 13 15  CREATININE 0.57 0.47  CALCIUM 9.4 9.0   GFR: Estimated Creatinine Clearance: 62.8 mL/min (by C-G formula based on SCr of 0.47 mg/dL). Liver Function Tests:  Recent Labs Lab 06/01/16 1526 06/02/16 0537  AST 18 15  ALT 18 15  ALKPHOS 98 66  BILITOT 0.7 0.5  PROT 9.3* 7.2  ALBUMIN 3.2* 2.4*   No results for input(s): LIPASE, AMYLASE in the last 168 hours. No results for input(s): AMMONIA in the last 168 hours. Coagulation Profile: No results for input(s): INR, PROTIME in the last 168 hours. Cardiac Enzymes: No results for input(s): CKTOTAL, CKMB, CKMBINDEX, TROPONINI in the last 168 hours. BNP (last  3 results) No results for input(s): PROBNP in the last 8760 hours. HbA1C: No results for input(s): HGBA1C in the last 72 hours. CBG:  Recent Labs Lab 06/01/16 2121 06/02/16 0734  GLUCAP 368* 272*   Lipid Profile: No results for input(s): CHOL, HDL, LDLCALC, TRIG, CHOLHDL, LDLDIRECT in the last 72 hours. Thyroid Function Tests: No results for input(s): TSH,  T4TOTAL, FREET4, T3FREE, THYROIDAB in the last 72 hours. Anemia Panel: No results for input(s): VITAMINB12, FOLATE, FERRITIN, TIBC, IRON, RETICCTPCT in the last 72 hours. Urine analysis:    Component Value Date/Time   COLORURINE YELLOW 04/01/2016 1051   APPEARANCEUR HAZY (A) 04/01/2016 1051   LABSPEC 1.032 (H) 04/01/2016 1051   PHURINE 6.0 04/01/2016 1051   GLUCOSEU >=500 (A) 04/01/2016 1051   HGBUR SMALL (A) 04/01/2016 1051   BILIRUBINUR N 04/28/2016 1603   KETONESUR 20 (A) 04/01/2016 1051   PROTEINUR N 04/28/2016 1603   PROTEINUR 30 (A) 04/01/2016 1051   UROBILINOGEN 0.2 04/28/2016 1603   UROBILINOGEN 0.2 06/18/2013 0650   NITRITE p 04/28/2016 1603   NITRITE POSITIVE (A) 04/01/2016 1051   LEUKOCYTESUR Negative 04/28/2016 1603   Sepsis Labs: @LABRCNTIP (procalcitonin:4,lacticidven:4) )No results found for this or any previous visit (from the past 240 hour(s)).       Radiology Studies: Mr Foot Left Wo Contrast  Result Date: 06/02/2016 CLINICAL DATA:  Foot pain and swelling.  Abnormal x-rays. EXAM: MRI OF THE LEFT FOOT WITHOUT CONTRAST TECHNIQUE: Multiplanar, multisequence MR imaging of the left foot was performed. No intravenous contrast was administered. COMPARISON:  Radiographs 06/01/2016 FINDINGS: There is diffuse signal abnormality in the second metatarsal consistent with osteomyelitis. Similar changes in the proximal phalanx of the second toe consistent with osteomyelitis and intervening septic arthritis at the second MTP joint. There are also large draining soft tissue abscesses along the dorsal and plantar  aspects of the foot. The plantar abscess extends to the skin where there is a large skin blister. It also extends medially and measures a total of 4.5 x 1.9 cm. The dorsal abscess measures 2.1 x 1.7 cm. There is also septic tenosynovitis involving the flexor tendon. Marrow edema in the third metatarsal head could also reflect osteomyelitis. There is advanced degenerative changes at the first metatarsal phalangeal joint but no definite findings for osteomyelitis/septic arthritis. The distal phalanges and cuneiforms appear normal. Diffuse cellulitis and myofasciitis. IMPRESSION: Septic arthritis involving the second MTP joint and associated osteomyelitis in the second metatarsal and second proximal phalanx. Cellulitis, myofasciitis and significant dorsal and plantar soft tissue abscesses as discussed above. Electronically Signed   By: Marijo Sanes M.D.   On: 06/02/2016 08:05   Dg Foot Complete Left  Result Date: 06/01/2016 CLINICAL DATA:  Pain and swelling. History of penetrating wound in the plantar aspect of the foot. EXAM: LEFT FOOT - COMPLETE 3+ VIEW COMPARISON:  05/23/2016 FINDINGS: Advanced degenerative changes involving the first metatarsal phalangeal joint with mild hallux valgus deformity. There are progressive erosive changes. Erosions are also noted involving the second, third and fourth MTP joints. Findings could be due to erosive arthropathy. Gout would be a consideration. Since the prior x-rays of 05/23/2016 there has been interval development of joint space narrowing involving the second MTP joint along with possible progressive destructive changes involving the proximal phalanx and possible pathologic fracture. Plain film findings are concerning for second MTP joint septic arthritis and osteomyelitis. MRI without with contrast is suggested for further evaluation. No significant midfoot or visualized hindfoot abnormalities. IMPRESSION: 1. Plain film findings worrisome for septic arthritis involving  the second MTP joint pain and osteomyelitis involving the second proximal phalanx. Recommend MRI left foot without and with contrast for further evaluation. 2. 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  Gout would be a consideration. Electronically Signed   By: Marijo Sanes M.D.   On: 06/01/2016 15:20      Scheduled Meds: . cefTRIAXone (ROCEPHIN)  IV  2 g Intravenous Q24H   And  . metronidazole  500 mg Intravenous Q8H  . enoxaparin (LOVENOX) injection  40 mg Subcutaneous Q24H  . escitalopram  10 mg Oral Daily  . insulin aspart  0-15 Units Subcutaneous TID WC  . insulin aspart  0-5 Units Subcutaneous QHS  . insulin glargine  30 Units Subcutaneous QHS  . lisinopril  2.5 mg Oral Daily  . living well with diabetes book   Does not apply Once  . multivitamin with minerals  1 tablet Oral Daily  . vancomycin  750 mg Intravenous Q12H   Continuous Infusions:   LOS: 1 day    Time spent in minutes: 70    [place], MD [facility] Pager: [email or web address] Password [email or web address] 06/02/2016, 11:11 AM

## 2016-06-03 DIAGNOSIS — E43 Unspecified severe protein-calorie malnutrition: Secondary | ICD-10-CM | POA: Insufficient documentation

## 2016-06-03 DIAGNOSIS — E440 Moderate protein-calorie malnutrition: Secondary | ICD-10-CM | POA: Insufficient documentation

## 2016-06-03 LAB — CBC
HCT: 29.8 % — ABNORMAL LOW (ref 36.0–46.0)
HEMOGLOBIN: 9.9 g/dL — AB (ref 12.0–15.0)
MCH: 29.4 pg (ref 26.0–34.0)
MCHC: 33.2 g/dL (ref 30.0–36.0)
MCV: 88.4 fL (ref 78.0–100.0)
Platelets: 363 10*3/uL (ref 150–400)
RBC: 3.37 MIL/uL — ABNORMAL LOW (ref 3.87–5.11)
RDW: 12.7 % (ref 11.5–15.5)
WBC: 12.6 10*3/uL — ABNORMAL HIGH (ref 4.0–10.5)

## 2016-06-03 LAB — GLUCOSE, CAPILLARY
Glucose-Capillary: 104 mg/dL — ABNORMAL HIGH (ref 65–99)
Glucose-Capillary: 292 mg/dL — ABNORMAL HIGH (ref 65–99)
Glucose-Capillary: 274 mg/dL — ABNORMAL HIGH (ref 65–99)
Glucose-Capillary: 137 mg/dL — ABNORMAL HIGH (ref 65–99)

## 2016-06-03 LAB — CREATININE, SERUM
Creatinine, Ser: 0.46 mg/dL (ref 0.44–1.00)
GFR calc Af Amer: >60 mL/min (ref >60)
GFR calc non Af Amer: >60 mL/min (ref >60)

## 2016-06-03 MED ORDER — LIP MEDEX EX OINT
TOPICAL_OINTMENT | CUTANEOUS | Status: AC
  Administered 2016-06-03
  Filled 2016-06-03: qty 7

## 2016-06-03 NOTE — Progress Notes (Addendum)
PROGRESS NOTE    Gina [name]   UVO:[id number]  DOB: September 07, 1960  DOA: 06/01/2016 PCP: Fredia Beets, FNP   Brief Narrative:  Gina [name] is a 56 y.o. female with medical history significant of poorly controlled diabetes mellitus with most recent hemoglobin A1c of greater than 15, hypertension, presents to the emergency room with chief complaint of left foot swelling and redness, as well as pain with ambulation.  Patient has a history of type 2 diabetes mellitus, however has been homeless up until about couple of months ago and has been without her medications for quite some time.  Was only taking Metformin and Lexapro at home and no insulin.   Over the last couple weeks, she has been complaining of left foot swelling which has been progressive.  She was in the emergency room about 8 days ago, she was evaluated and placed on 2 different oral antibiotics (bactrim and keflex per records) and sent home.  Despite taking oral antibiotics, her swelling has progressed and her pain has increased into her left foot.  She denies any systemic symptoms, she has no fever or chills, she has no abdominal pain, no nausea, vomiting or diarrhea.  She denies any chest pain or shortness of breath.  Subjective: Left foot pain otherwise no complaints.   ROS: negative  Assessment & Plan:   Principal Problem:   Osteomyelitis due to type 2 diabetes mellitus    Septic arthritis of interphalangeal joint of toe of left foot - MRI report below- showing left 2nd toe septic arthritis and osteomyelitis and fluid collections in foot - consulted Dr Sharol Given, obtained ABIs (are normal) and on Dr Jess Barters requested, consulted vascular surgery as well- Dr Donzetta Matters does not feel she needs any vascular intervention - cont Vanc, Rocephin and Flagyl - blood cultures NGTD - awaiting ortho eval  Active Problems:   Type 2 diabetes mellitus - severely uncontrolled with A1c > 15 (note at it was the same in 2015 when she was  supposedly on Insulin) - has only been taking Metformin and not Insulin due to cost - sugars improved on 30 U Lantus- cont SSI - discussed need for control in detail with patient    Essential hypertension  - Lisinopril added  Depression - cont Lexapro   Severe Protein calorie Malnutrition - nutritionist added Glucerna    DVT prophylaxis: Lovenox Code Status: Full code Family Communication:  Disposition Plan: follow on med/surg Consultants:   Ortho  Vascular sx Procedures:    Antimicrobials:  Anti-infectives    Start     Dose/Rate Route Frequency Ordered Stop   06/02/16 2100  metroNIDAZOLE (FLAGYL) IVPB 500 mg     500 mg 100 mL/hr over 60 Minutes Intravenous Every 8 hours 06/01/16 1834     06/02/16 0600  vancomycin (VANCOCIN) IVPB 750 mg/150 ml premix     750 mg 150 mL/hr over 60 Minutes Intravenous Every 12 hours 06/01/16 1832     06/02/16 0400  vancomycin (VANCOCIN) IVPB 750 mg/150 ml premix  Status:  Discontinued     750 mg 150 mL/hr over 60 Minutes Intravenous Every 12 hours 06/01/16 1624 06/01/16 1832   06/01/16 2000  cefTRIAXone (ROCEPHIN) 2 g in dextrose 5 % 50 mL IVPB     2 g 100 mL/hr over 30 Minutes Intravenous Every 24 hours 06/01/16 1834     06/01/16 1715  cefTRIAXone (ROCEPHIN) 2 g in dextrose 5 % 50 mL IVPB  Status:  Discontinued     2 g 100  mL/hr over 30 Minutes Intravenous Every 24 hours 06/01/16 1712 06/01/16 1834   06/01/16 1715  metroNIDAZOLE (FLAGYL) IVPB 500 mg  Status:  Discontinued     500 mg 100 mL/hr over 60 Minutes Intravenous Every 8 hours 06/01/16 1712 06/01/16 1834   06/01/16 1515  vancomycin (VANCOCIN) IVPB 1000 mg/200 mL premix     1,000 mg 200 mL/hr over 60 Minutes Intravenous  Once 06/01/16 1504 06/01/16 1816   06/01/16 1500  piperacillin-tazobactam (ZOSYN) IVPB 3.375 g     3.375 g 12.5 mL/hr over 240 Minutes Intravenous  Once 06/01/16 1458 06/01/16 1702       Objective: Vitals:   06/02/16 1342 06/02/16 2233 06/03/16 0500  06/03/16 0830  BP: 101/65 109/69 127/80 (!) 141/83  Pulse: 84 84 77 76  Resp: 18 18 18    Temp: 98.3 F (36.8 C) 98.5 F (36.9 C) 97.6 F (36.4 C) 97.5 F (36.4 C)  TempSrc: Oral Oral Oral Oral  SpO2: 98% 99% 100% 100%  Weight:      Height:        Intake/Output Summary (Last 24 hours) at 06/03/16 1119 Last data filed at 06/03/16 0700  Gross per 24 hour  Intake              750 ml  Output              750 ml  Net                0 ml   Filed Weights   06/01/16 1458  Weight: 58.1 kg (128 lb)    Examination: General exam: Appears comfortable  HEENT: PERRLA, oral mucosa moist, no sclera icterus or thrush Respiratory system: Clear to auscultation. Respiratory effort normal. Cardiovascular system: S1 & S2 heard, RRR.  No murmurs  Gastrointestinal system: Abdomen soft, non-tender, nondistended. Normal bowel sound. No organomegaly Central nervous system: Alert and oriented. No focal neurological deficits. Extremities: No cyanosis, clubbing - 2+ edema left foot and toes with erythema and tenderness Skin: No ulcers Psychiatry:  Mood & affect appropriate.     Data Reviewed: I have personally reviewed following labs and imaging studies  CBC:  Recent Labs Lab 06/01/16 1526 06/02/16 0537 06/03/16 0537  WBC 13.4* 13.0* 12.6*  NEUTROABS 10.7*  --   --   HGB 12.3 10.7* 9.9*  HCT 37.2 32.2* 29.8*  MCV 88.8 89.2 88.4  PLT 423* 373 329   Basic Metabolic Panel:  Recent Labs Lab 06/01/16 1526 06/02/16 0537 06/03/16 0537  NA 136 135  --   K 3.6 4.0  --   CL 98* 101  --   CO2 27 27  --   GLUCOSE 325* 287*  --   BUN 13 15  --   CREATININE 0.57 0.47 0.46  CALCIUM 9.4 9.0  --    GFR: Estimated Creatinine Clearance: 62.8 mL/min (by C-G formula based on SCr of 0.46 mg/dL). Liver Function Tests:  Recent Labs Lab 06/01/16 1526 06/02/16 0537  AST 18 15  ALT 18 15  ALKPHOS 98 66  BILITOT 0.7 0.5  PROT 9.3* 7.2  ALBUMIN 3.2* 2.4*   No results for input(s): LIPASE,  AMYLASE in the last 168 hours. No results for input(s): AMMONIA in the last 168 hours. Coagulation Profile: No results for input(s): INR, PROTIME in the last 168 hours. Cardiac Enzymes: No results for input(s): CKTOTAL, CKMB, CKMBINDEX, TROPONINI in the last 168 hours. BNP (last 3 results) No results for input(s): PROBNP in the  last 8760 hours. HbA1C: No results for input(s): HGBA1C in the last 72 hours. CBG:  Recent Labs Lab 06/02/16 0734 06/02/16 1138 06/02/16 1631 06/02/16 2236 06/03/16 0745  GLUCAP 272* 193* 193* 358* 104*   Lipid Profile: No results for input(s): CHOL, HDL, LDLCALC, TRIG, CHOLHDL, LDLDIRECT in the last 72 hours. Thyroid Function Tests: No results for input(s): TSH, T4TOTAL, FREET4, T3FREE, THYROIDAB in the last 72 hours. Anemia Panel: No results for input(s): VITAMINB12, FOLATE, FERRITIN, TIBC, IRON, RETICCTPCT in the last 72 hours. Urine analysis:    Component Value Date/Time   COLORURINE YELLOW 04/01/2016 1051   APPEARANCEUR HAZY (A) 04/01/2016 1051   LABSPEC 1.032 (H) 04/01/2016 1051   PHURINE 6.0 04/01/2016 1051   GLUCOSEU >=500 (A) 04/01/2016 1051   HGBUR SMALL (A) 04/01/2016 1051   BILIRUBINUR N 04/28/2016 1603   KETONESUR 20 (A) 04/01/2016 1051   PROTEINUR N 04/28/2016 1603   PROTEINUR 30 (A) 04/01/2016 1051   UROBILINOGEN 0.2 04/28/2016 1603   UROBILINOGEN 0.2 06/18/2013 0650   NITRITE p 04/28/2016 1603   NITRITE POSITIVE (A) 04/01/2016 1051   LEUKOCYTESUR Negative 04/28/2016 1603   Sepsis Labs: @LABRCNTIP (procalcitonin:4,lacticidven:4) ) Recent Results (from the past 240 hour(s))  Culture, blood (single)     Status: None (Preliminary result)   Collection Time: 06/01/16  3:27 PM  Result Value Ref Range Status   Specimen Description BLOOD RIGHT ANTECUBITAL  Final   Special Requests BOTTLES DRAWN AEROBIC AND ANAEROBIC 5ML  Final   Culture   Final    NO GROWTH < 24 HOURS Performed at [facility], [place] [address],  [place], [place] [address]    Report Status PENDING  Incomplete         Radiology Studies: Mr Foot Left Wo Contrast  Result Date: 06/02/2016 CLINICAL DATA:  Foot pain and swelling.  Abnormal x-rays. EXAM: MRI OF THE LEFT FOOT WITHOUT CONTRAST TECHNIQUE: Multiplanar, multisequence MR imaging of the left foot was performed. No intravenous contrast was administered. COMPARISON:  Radiographs 06/01/2016 FINDINGS: There is diffuse signal abnormality in the second metatarsal consistent with osteomyelitis. Similar changes in the proximal phalanx of the second toe consistent with osteomyelitis and intervening septic arthritis at the second MTP joint. There are also large draining soft tissue abscesses along the dorsal and plantar aspects of the foot. The plantar abscess extends to the skin where there is a large skin blister. It also extends medially and measures a total of 4.5 x 1.9 cm. The dorsal abscess measures 2.1 x 1.7 cm. There is also septic tenosynovitis involving the flexor tendon. Marrow edema in the third metatarsal head could also reflect osteomyelitis. There is advanced degenerative changes at the first metatarsal phalangeal joint but no definite findings for osteomyelitis/septic arthritis. The distal phalanges and cuneiforms appear normal. Diffuse cellulitis and myofasciitis. IMPRESSION: Septic arthritis involving the second MTP joint and associated osteomyelitis in the second metatarsal and second proximal phalanx. Cellulitis, myofasciitis and significant dorsal and plantar soft tissue abscesses as discussed above. Electronically Signed   By: Marijo Sanes M.D.   On: 06/02/2016 08:05   Dg Foot Complete Left  Result Date: 06/01/2016 CLINICAL DATA:  Pain and swelling. History of penetrating wound in the plantar aspect of the foot. EXAM: LEFT FOOT - COMPLETE 3+ VIEW COMPARISON:  05/23/2016 FINDINGS: Advanced degenerative changes involving the first metatarsal phalangeal joint with mild hallux valgus  deformity. There are progressive erosive changes. Erosions are also noted involving the second, third and fourth MTP joints. Findings  could be due to erosive arthropathy. Gout would be a consideration. Since the prior x-rays of 05/23/2016 there has been interval development of joint space narrowing involving the second MTP joint along with possible progressive destructive changes involving the proximal phalanx and possible pathologic fracture. Plain film findings are concerning for second MTP joint septic arthritis and osteomyelitis. MRI without with contrast is suggested for further evaluation. No significant midfoot or visualized hindfoot abnormalities. IMPRESSION: 1. Plain film findings worrisome for septic arthritis involving the second MTP joint pain and osteomyelitis involving the second proximal phalanx. Recommend MRI left foot without and with contrast for further evaluation. 2. 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  Gout would be a consideration. Electronically Signed   By: Marijo Sanes M.D.   On: 06/01/2016 15:20      Scheduled Meds: . cefTRIAXone (ROCEPHIN)  IV  2 g Intravenous Q24H   And  . metronidazole  500 mg Intravenous Q8H  . enoxaparin (LOVENOX) injection  40 mg Subcutaneous Q24H  . escitalopram  10 mg Oral Daily  . feeding supplement (GLUCERNA SHAKE)  237 mL Oral TID BM  . insulin aspart  0-15 Units Subcutaneous TID WC  . insulin aspart  0-5 Units Subcutaneous QHS  . insulin glargine  30 Units Subcutaneous QHS  . lisinopril  2.5 mg Oral Daily  . multivitamin with minerals  1 tablet Oral Daily  . vancomycin  750 mg Intravenous Q12H   Continuous Infusions:   LOS: 2 days    Time spent in minutes: 37    [place], MD [facility] Pager: [email or web address] Password [facility] 06/03/2016, 11:19 AM

## 2016-06-04 DIAGNOSIS — L03119 Cellulitis of unspecified part of limb: Secondary | ICD-10-CM

## 2016-06-04 DIAGNOSIS — E13628 Other specified diabetes mellitus with other skin complications: Secondary | ICD-10-CM

## 2016-06-04 LAB — CREATININE, SERUM
Creatinine, Ser: 0.44 mg/dL (ref 0.44–1.00)
GFR calc Af Amer: >60 mL/min (ref >60)
GFR calc non Af Amer: >60 mL/min (ref >60)

## 2016-06-04 LAB — MRSA PCR SCREENING: MRSA BY PCR: NEGATIVE

## 2016-06-04 LAB — GLUCOSE, CAPILLARY
Glucose-Capillary: 198 mg/dL — ABNORMAL HIGH (ref 65–99)
Glucose-Capillary: 104 mg/dL — ABNORMAL HIGH (ref 65–99)
Glucose-Capillary: 229 mg/dL — ABNORMAL HIGH (ref 65–99)
Glucose-Capillary: 264 mg/dL — ABNORMAL HIGH (ref 65–99)
Glucose-Capillary: 209 mg/dL — ABNORMAL HIGH (ref 65–99)

## 2016-06-04 LAB — URIC ACID: URIC ACID, SERUM: 3.8 mg/dL (ref 2.3–6.6)

## 2016-06-04 LAB — VANCOMYCIN, TROUGH: Vancomycin Tr: 8 ug/mL — ABNORMAL LOW (ref 15–20)

## 2016-06-04 MED ORDER — CHLORHEXIDINE GLUCONATE 4 % EX LIQD
60.0000 mL | Freq: Once | CUTANEOUS | Status: AC
  Administered 2016-06-05: 4 via TOPICAL
  Filled 2016-06-04 (×2): qty 60

## 2016-06-04 MED ORDER — POVIDONE-IODINE 10 % EX SWAB
2.0000 "application " | Freq: Once | CUTANEOUS | Status: AC
  Administered 2016-06-05: 2 via TOPICAL
  Filled 2016-06-04: qty 2

## 2016-06-04 MED ORDER — VANCOMYCIN HCL IN DEXTROSE 1-5 GM/200ML-% IV SOLN
1000.0000 mg | Freq: Two times a day (BID) | INTRAVENOUS | Status: DC
  Administered 2016-06-04 – 2016-06-07 (×7): 1000 mg via INTRAVENOUS
  Filled 2016-06-04 (×8): qty 200

## 2016-06-04 NOTE — Progress Notes (Signed)
Pharmacy Antibiotic Follow-up Note  Gina [name] is a 56 y.o. year-old female admitted on 06/01/2016.  The patient is currently on day 1 of Vancomycin & Zosyn for LLE cellulitis. - prev Ed visit [facility] 3/13 LLE cellulitis related to stepping on pin-declined admission - home on Keflex/Bactrim. Likely non-compliant with abx, DM medications  Assessment/Plan: 0646 VT=8 mg/L below goal on 750 mg IV q12h Scr stable Increase Vancomycin to 1 Gm IV q12h Aiming for higher Vanc trough with continued LE cellulitis Ceftriaxone 2gm q24 Flagyl 500mg  IV q8  Temp (24hrs), Avg:98 F (36.7 C), Min:97.5 F (36.4 C), Max:98.6 F (37 C)   Recent Labs Lab 06/01/16 1526 06/02/16 0537 06/03/16 0537  WBC 13.4* 13.0* 12.6*     Recent Labs Lab 06/01/16 1526 06/02/16 0537 06/03/16 0537 06/04/16 0531  CREATININE 0.57 0.47 0.46 0.44   Estimated Creatinine Clearance: 62.8 mL/min (by C-G formula based on SCr of 0.44 mg/dL).    Allergies  Allergen Reactions  . Metformin And Related Other (See Comments)    Upset stomach   Antimicrobials this admission: 3/22 Zosyn x1  3/22 Vanc >> 3/22 Rocephin >. 3/22 Flagyl >>   Levels/dose changes this admission:  Microbiology results: 3/22 BCx: x1 sent  Thank you for allowing pharmacy to be a part of this patient's care.  Minda Ditto PharmD Pager [phone] 06/04/2016, 6:45 AM

## 2016-06-04 NOTE — Consult Note (Signed)
ORTHOPAEDIC CONSULTATION  REQUESTING PHYSICIAN: [name], MD  Chief Complaint: Left foot pain swelling.  HPI: Gina [name] is a 56 y.o. female who presents with cellulitis and swelling of the left forefoot. Patient states that she stepped on a tack and has subsequently developed the redness swelling and pain.  Past Medical History:  Diagnosis Date  . Diabetes mellitus   . Hypertension    Past Surgical History:  Procedure Laterality Date  . TUBAL LIGATION     Social History   Social History  . Marital status: Single    Spouse name: N/A  . Number of children: N/A  . Years of education: N/A   Social History Main Topics  . Smoking status: Never Smoker  . Smokeless tobacco: Never Used  . Alcohol use No     Comment: occasionally  . Drug use: No  . Sexual activity: Not Asked   Other Topics Concern  . None   Social History Narrative  . None   Family History  Problem Relation Age of Onset  . Diabetes type II Father   . CAD Father   . Prostate cancer Father   . Diabetes Mellitus II Brother    - negative except otherwise stated in the family history section Allergies  Allergen Reactions  . Metformin And Related Other (See Comments)    Upset stomach   Prior to Admission medications   Medication Sig Start Date End Date Taking? Authorizing Provider  escitalopram (LEXAPRO) 10 MG tablet Take 1 tablet (10 mg total) by mouth daily. After 1 week may increase take 2 tablets (20 mg total) by mouth daily. Patient taking differently: Take 10 mg by mouth daily.  04/28/16  Yes Alfonse Spruce, FNP  metFORMIN (GLUCOPHAGE-XR) 500 MG 24 hr tablet Take 2 tablets (1,000 mg total) by mouth 2 (two) times daily. 04/28/16  Yes Alfonse Spruce, FNP  glucose blood test strip Use as instructed 04/28/16   Alfonse Spruce, FNP  glucose monitoring kit (FREESTYLE) monitoring kit Use glucometer to check sugar three times a day 02/17/14   Tresa Garter, MD  Lancets 30G MISC  Use to check blood sugar three times a day 02/17/14   Tresa Garter, MD  Syringe/Needle, Disp, (PRODIGY SAFETY SYRINGES) 29G X 1/2" 1 ML MISC Use syringe to inject insulin twice a day 02/17/14   Tresa Garter, MD   No results found. - pertinent xrays, CT, MRI studies were reviewed and independently interpreted  Positive ROS: All other systems have been reviewed and were otherwise negative with the exception of those mentioned in the HPI and as above.  Physical Exam: General: Alert, no acute distress Psychiatric: Patient is competent for consent with normal mood and affect Lymphatic: No axillary or cervical lymphadenopathy Cardiovascular: No pedal edema Respiratory: No cyanosis, no use of accessory musculature GI: No organomegaly, abdomen is soft and non-tender  Skin: Patient has cellulitis involving the forefoot with swelling and atrophic skin   Neurologic: Patient does not have protective sensation bilateral lower extremities.   MUSCULOSKELETAL:  Examination patient is a good dorsalis pedis pulse bilaterally. There is no swelling in the right lower extremity where foot she has swelling of the forefoot with cellulitis. She does have a evidence of a puncture wound on the plantar aspect of her foot beneath the second metatarsal head. After informed consent and 18-gauge needle after sterile prepping was inserted into the dorsum of the foot over the second metatarsal where there is an area  of cellulitic swelling. Purulent drainage was encountered. This was sent for cultures. Review the MRI scan does show fluid collection around the metatarsal heads with evidence of osteomyelitis. Review of the radiographs shows some chronic cystic periarticular changes consistent with possible chronic gout.  Assessment: Assessment: Diabetic insensate neuropathy with caloric malnutrition with abscess and osteomyelitis of the left forefoot  Plan: Plan: We'll plan for surgery tomorrow after 5 with  irrigation and debridement of the abscess possible transmetatarsal amputation possible placement of an installation wound VAC.  Thank you for the consult and the opportunity to see [name], MD [place] [phone] 12:09 PM

## 2016-06-04 NOTE — Progress Notes (Signed)
PROGRESS NOTE    Gina [name]   PIR:[id number]  DOB: [birth year]  DOA: 06/01/2016 PCP: Fredia Beets, FNP   Brief Narrative:  Gina [name] is a 56 y.o. female with medical history significant of poorly controlled diabetes mellitus with most recent hemoglobin A1c of greater than 15, hypertension, presents to the emergency room with chief complaint of left foot swelling and redness, as well as pain with ambulation.  Patient has a history of type 2 diabetes mellitus, however has been homeless up until about couple of months ago and has been without her medications for quite some time.  Was only taking Metformin and Lexapro at home and no insulin.   Over the last couple weeks, she has been complaining of left foot swelling which has been progressive.  She was in the emergency room about 8 days ago, she was evaluated and placed on 2 different oral antibiotics (bactrim and keflex per records) and sent home.  Despite taking oral antibiotics, her swelling has progressed and her pain has increased into her left foot.  She denies any systemic symptoms, she has no fever or chills, she has no abdominal pain, no nausea, vomiting or diarrhea.  She denies any chest pain or shortness of breath.  Subjective: No new complaints.   ROS: negative  Assessment & Plan:   Principal Problem:   Osteomyelitis due to type 2 diabetes mellitus    Septic arthritis of interphalangeal joint of toe of left foot - MRI report below- showing left 2nd toe septic arthritis and osteomyelitis and fluid collections in foot - consulted Dr Sharol Given, obtained ABIs (are normal) and on Dr Jess Barters requested, consulted vascular surgery as well- Dr Donzetta Matters does not feel she needs any vascular intervention - cont Vanc, Rocephin and Flagyl - blood cultures NGTD - awaiting ortho eval  Active Problems:   Type 2 diabetes mellitus - severely uncontrolled with A1c > 15 (note at it was the same in 2015 when she was supposedly on Insulin) -  has only been taking Metformin and not Insulin due to cost - sugars improved on 30 U Lantus- cont SSI - discussed need for control in detail with patient    Essential hypertension  - Lisinopril added  Depression - cont Lexapro   Severe Protein calorie Malnutrition - nutritionist added Glucerna    DVT prophylaxis: Lovenox Code Status: Full code Family Communication:  Disposition Plan: follow on med/surg Consultants:   Ortho  Vascular sx Procedures:    Antimicrobials:  Anti-infectives    Start     Dose/Rate Route Frequency Ordered Stop   06/04/16 0800  vancomycin (VANCOCIN) IVPB 1000 mg/200 mL premix     1,000 mg 200 mL/hr over 60 Minutes Intravenous Every 12 hours 06/04/16 0649     06/02/16 2100  metroNIDAZOLE (FLAGYL) IVPB 500 mg     500 mg 100 mL/hr over 60 Minutes Intravenous Every 8 hours 06/01/16 1834     06/02/16 0600  vancomycin (VANCOCIN) IVPB 750 mg/150 ml premix  Status:  Discontinued     750 mg 150 mL/hr over 60 Minutes Intravenous Every 12 hours 06/01/16 1832 06/04/16 0649   06/02/16 0400  vancomycin (VANCOCIN) IVPB 750 mg/150 ml premix  Status:  Discontinued     750 mg 150 mL/hr over 60 Minutes Intravenous Every 12 hours 06/01/16 1624 06/01/16 1832   06/01/16 2000  cefTRIAXone (ROCEPHIN) 2 g in dextrose 5 % 50 mL IVPB     2 g 100 mL/hr over 30 Minutes Intravenous Every 24  hours 06/01/16 1834     06/01/16 1715  cefTRIAXone (ROCEPHIN) 2 g in dextrose 5 % 50 mL IVPB  Status:  Discontinued     2 g 100 mL/hr over 30 Minutes Intravenous Every 24 hours 06/01/16 1712 06/01/16 1834   06/01/16 1715  metroNIDAZOLE (FLAGYL) IVPB 500 mg  Status:  Discontinued     500 mg 100 mL/hr over 60 Minutes Intravenous Every 8 hours 06/01/16 1712 06/01/16 1834   06/01/16 1515  vancomycin (VANCOCIN) IVPB 1000 mg/200 mL premix     1,000 mg 200 mL/hr over 60 Minutes Intravenous  Once 06/01/16 1504 06/01/16 1816   06/01/16 1500  piperacillin-tazobactam (ZOSYN) IVPB 3.375 g      3.375 g 12.5 mL/hr over 240 Minutes Intravenous  Once 06/01/16 1458 06/01/16 1702       Objective: Vitals:   06/03/16 0830 06/03/16 1400 06/03/16 2047 06/04/16 0631  BP: (!) 141/83 103/63 108/62 127/84  Pulse: 76 83 88 78  Resp:  18 18 18   Temp: 97.5 F (36.4 C) 97.9 F (36.6 C) 98.6 F (37 C) 97.9 F (36.6 C)  TempSrc: Oral Axillary Oral Oral  SpO2: 100% 100% 98% 100%  Weight:      Height:        Intake/Output Summary (Last 24 hours) at 06/04/16 1024 Last data filed at 06/04/16 0700  Gross per 24 hour  Intake              810 ml  Output                0 ml  Net              810 ml   Filed Weights   06/01/16 1458  Weight: 58.1 kg (128 lb)    Examination: General exam: Appears comfortable  HEENT: PERRLA, oral mucosa moist, no sclera icterus or thrush Respiratory system: Clear to auscultation. Respiratory effort normal. Cardiovascular system: S1 & S2 heard, RRR.  No murmurs  Gastrointestinal system: Abdomen soft, non-tender, nondistended. Normal bowel sound. No organomegaly Central nervous system: Alert and oriented. No focal neurological deficits. Extremities: No cyanosis, clubbing - 2+ edema left foot and toes with erythema and tenderness Skin: No ulcers Psychiatry:  Mood & affect appropriate.     Data Reviewed: I have personally reviewed following labs and imaging studies  CBC:  Recent Labs Lab 06/01/16 1526 06/02/16 0537 06/03/16 0537  WBC 13.4* 13.0* 12.6*  NEUTROABS 10.7*  --   --   HGB 12.3 10.7* 9.9*  HCT 37.2 32.2* 29.8*  MCV 88.8 89.2 88.4  PLT 423* 373 443   Basic Metabolic Panel:  Recent Labs Lab 06/01/16 1526 06/02/16 0537 06/03/16 0537 06/04/16 0531  NA 136 135  --   --   K 3.6 4.0  --   --   CL 98* 101  --   --   CO2 27 27  --   --   GLUCOSE 325* 287*  --   --   BUN 13 15  --   --   CREATININE 0.57 0.47 0.46 0.44  CALCIUM 9.4 9.0  --   --    GFR: Estimated Creatinine Clearance: 62.8 mL/min (by C-G formula based on SCr of  0.44 mg/dL). Liver Function Tests:  Recent Labs Lab 06/01/16 1526 06/02/16 0537  AST 18 15  ALT 18 15  ALKPHOS 98 66  BILITOT 0.7 0.5  PROT 9.3* 7.2  ALBUMIN 3.2* 2.4*   No results for input(s):  LIPASE, AMYLASE in the last 168 hours. No results for input(s): AMMONIA in the last 168 hours. Coagulation Profile: No results for input(s): INR, PROTIME in the last 168 hours. Cardiac Enzymes: No results for input(s): CKTOTAL, CKMB, CKMBINDEX, TROPONINI in the last 168 hours. BNP (last 3 results) No results for input(s): PROBNP in the last 8760 hours. HbA1C: No results for input(s): HGBA1C in the last 72 hours. CBG:  Recent Labs Lab 06/03/16 1143 06/03/16 1653 06/03/16 2006 06/04/16 0203 06/04/16 0719  GLUCAP 292* 274* 137* 198* 104*   Lipid Profile: No results for input(s): CHOL, HDL, LDLCALC, TRIG, CHOLHDL, LDLDIRECT in the last 72 hours. Thyroid Function Tests: No results for input(s): TSH, T4TOTAL, FREET4, T3FREE, THYROIDAB in the last 72 hours. Anemia Panel: No results for input(s): VITAMINB12, FOLATE, FERRITIN, TIBC, IRON, RETICCTPCT in the last 72 hours. Urine analysis:    Component Value Date/Time   COLORURINE YELLOW 04/01/2016 1051   APPEARANCEUR HAZY (A) 04/01/2016 1051   LABSPEC 1.032 (H) 04/01/2016 1051   PHURINE 6.0 04/01/2016 1051   GLUCOSEU >=500 (A) 04/01/2016 1051   HGBUR SMALL (A) 04/01/2016 1051   BILIRUBINUR N 04/28/2016 1603   KETONESUR 20 (A) 04/01/2016 1051   PROTEINUR N 04/28/2016 1603   PROTEINUR 30 (A) 04/01/2016 1051   UROBILINOGEN 0.2 04/28/2016 1603   UROBILINOGEN 0.2 06/18/2013 0650   NITRITE p 04/28/2016 1603   NITRITE POSITIVE (A) 04/01/2016 1051   LEUKOCYTESUR Negative 04/28/2016 1603   Sepsis Labs: @LABRCNTIP (procalcitonin:4,lacticidven:4) ) Recent Results (from the past 240 hour(s))  Culture, blood (single)     Status: None (Preliminary result)   Collection Time: 06/01/16  3:27 PM  Result Value Ref Range Status   Specimen  Description BLOOD RIGHT ANTECUBITAL  Final   Special Requests BOTTLES DRAWN AEROBIC AND ANAEROBIC 5ML  Final   Culture   Final    NO GROWTH 2 DAYS Performed at [facility], [place] [address], [place], [place] [address]    Report Status PENDING  Incomplete         Radiology Studies: No results found.    Scheduled Meds: . cefTRIAXone (ROCEPHIN)  IV  2 g Intravenous Q24H   And  . metronidazole  500 mg Intravenous Q8H  . enoxaparin (LOVENOX) injection  40 mg Subcutaneous Q24H  . escitalopram  10 mg Oral Daily  . feeding supplement (GLUCERNA SHAKE)  237 mL Oral TID BM  . insulin aspart  0-15 Units Subcutaneous TID WC  . insulin aspart  0-5 Units Subcutaneous QHS  . insulin glargine  30 Units Subcutaneous QHS  . lisinopril  2.5 mg Oral Daily  . multivitamin with minerals  1 tablet Oral Daily  . vancomycin  1,000 mg Intravenous Q12H   Continuous Infusions:   LOS: 3 days    Time spent in minutes: [place], MD [facility] Pager: [email or web address] Password [facility] 06/04/2016, 10:24 AM

## 2016-06-05 ENCOUNTER — Encounter (HOSPITAL_COMMUNITY)
Admission: EM | Disposition: A | Discharge status: Home Health | Payer: Self-pay | Source: Home / Self Care | Attending: Internal Medicine

## 2016-06-05 ENCOUNTER — Inpatient Hospital Stay (HOSPITAL_COMMUNITY): Payer: Self-pay | Admitting: Anesthesiology

## 2016-06-05 ENCOUNTER — Encounter (HOSPITAL_COMMUNITY): Payer: Self-pay | Admitting: Anesthesiology

## 2016-06-05 ENCOUNTER — Inpatient Hospital Stay: Payer: Self-pay

## 2016-06-05 DIAGNOSIS — L03116 Cellulitis of left lower limb: Secondary | ICD-10-CM

## 2016-06-05 DIAGNOSIS — L03119 Cellulitis of unspecified part of limb: Secondary | ICD-10-CM

## 2016-06-05 DIAGNOSIS — E13628 Other specified diabetes mellitus with other skin complications: Secondary | ICD-10-CM

## 2016-06-05 DIAGNOSIS — E1169 Type 2 diabetes mellitus with other specified complication: Secondary | ICD-10-CM

## 2016-06-05 DIAGNOSIS — M869 Osteomyelitis, unspecified: Secondary | ICD-10-CM

## 2016-06-05 HISTORY — PX: AMPUTATION: SHX166

## 2016-06-05 LAB — GLUCOSE, CAPILLARY
Glucose-Capillary: 117 mg/dL — ABNORMAL HIGH (ref 65–99)
Glucose-Capillary: 257 mg/dL — ABNORMAL HIGH (ref 65–99)
Glucose-Capillary: 92 mg/dL (ref 65–99)
Glucose-Capillary: 115 mg/dL — ABNORMAL HIGH (ref 65–99)
Glucose-Capillary: 154 mg/dL — ABNORMAL HIGH (ref 65–99)

## 2016-06-05 LAB — IRON AND TIBC
IRON: 27 ug/dL — AB (ref 28–170)
Saturation Ratios: 10 % — ABNORMAL LOW (ref 10.4–31.8)
TIBC: 262 ug/dL (ref 250–450)
UIBC: 235 ug/dL

## 2016-06-05 LAB — CBC
HCT: 28.1 % — ABNORMAL LOW (ref 36.0–46.0)
Hemoglobin: 9.4 g/dL — ABNORMAL LOW (ref 12.0–15.0)
MCH: 30 pg (ref 26.0–34.0)
MCHC: 33.5 g/dL (ref 30.0–36.0)
MCV: 89.8 fL (ref 78.0–100.0)
PLATELETS: 372 10*3/uL (ref 150–400)
RBC: 3.13 MIL/uL — ABNORMAL LOW (ref 3.87–5.11)
RDW: 13.3 % (ref 11.5–15.5)
WBC: 13.3 10*3/uL — ABNORMAL HIGH (ref 4.0–10.5)

## 2016-06-05 LAB — BASIC METABOLIC PANEL
Anion gap: 5 (ref 5–15)
BUN: 15 mg/dL (ref 6–20)
CALCIUM: 8.7 mg/dL — AB (ref 8.9–10.3)
CO2: 24 mmol/L (ref 22–32)
CREATININE: 0.37 mg/dL — AB (ref 0.44–1.00)
Chloride: 104 mmol/L (ref 101–111)
GFR calc non Af Amer: >60 mL/min (ref >60)
GLUCOSE: 173 mg/dL — AB (ref 65–99)
Potassium: 4.4 mmol/L (ref 3.5–5.1)
Sodium: 133 mmol/L — ABNORMAL LOW (ref 135–145)

## 2016-06-05 LAB — HEMOGLOBIN A1C
HEMOGLOBIN A1C: 14.3 % — AB (ref 4.8–5.6)
Mean Plasma Glucose: 364 mg/dL

## 2016-06-05 LAB — FOLATE: Folate: 10.7 ng/mL (ref >5.9)

## 2016-06-05 LAB — RETICULOCYTES
RBC.: 3.39 MIL/uL — ABNORMAL LOW (ref 3.87–5.11)
Retic Count, Absolute: 40.7 10*3/uL (ref 19.0–186.0)
Retic Ct Pct: 1.2 % (ref 0.4–3.1)

## 2016-06-05 LAB — FERRITIN: FERRITIN: 196 ng/mL (ref 11–307)

## 2016-06-05 SURGERY — AMPUTATION, FOOT, RAY
Anesthesia: General | Site: Foot | Laterality: Left

## 2016-06-05 MED ORDER — ONDANSETRON HCL 4 MG/2ML IJ SOLN: 4.0000 mg | Freq: Four times a day (QID) | INTRAMUSCULAR | Status: DC | PRN

## 2016-06-05 MED ORDER — DOCUSATE SODIUM 100 MG PO CAPS
100.0000 mg | ORAL_CAPSULE | Freq: Two times a day (BID) | ORAL | Status: DC
  Administered 2016-06-05 – 2016-06-07 (×3): 100 mg via ORAL
  Filled 2016-06-05 (×3): qty 1

## 2016-06-05 MED ORDER — 0.9 % SODIUM CHLORIDE (POUR BTL) OPTIME
TOPICAL | Status: DC | PRN
  Administered 2016-06-05: 1000 mL

## 2016-06-05 MED ORDER — ONDANSETRON HCL 4 MG/2ML IJ SOLN
INTRAMUSCULAR | Status: AC
  Filled 2016-06-05: qty 2

## 2016-06-05 MED ORDER — BISACODYL 10 MG RE SUPP: 10.0000 mg | Freq: Every day | RECTAL | Status: DC | PRN

## 2016-06-05 MED ORDER — HYDROMORPHONE HCL 1 MG/ML IJ SOLN
0.2500 mg | INTRAMUSCULAR | Status: DC | PRN
Start: 2016-06-05 — End: 2016-06-05
  Administered 2016-06-05 (×2): 0.5 mg via INTRAVENOUS

## 2016-06-05 MED ORDER — PHENYLEPHRINE HCL 10 MG/ML IJ SOLN
INTRAMUSCULAR | Status: DC | PRN
  Administered 2016-06-05 (×2): 40 ug via INTRAVENOUS

## 2016-06-05 MED ORDER — MAGNESIUM CITRATE PO SOLN: 1.0000 | Freq: Once | ORAL | Status: DC | PRN

## 2016-06-05 MED ORDER — PROPOFOL 10 MG/ML IV BOLUS
INTRAVENOUS | Status: AC
  Filled 2016-06-05: qty 40

## 2016-06-05 MED ORDER — HYDROMORPHONE HCL 1 MG/ML IJ SOLN
INTRAMUSCULAR | Status: AC
  Filled 2016-06-05: qty 1

## 2016-06-05 MED ORDER — METOCLOPRAMIDE HCL 5 MG/ML IJ SOLN: 5.0000 mg | Freq: Three times a day (TID) | INTRAMUSCULAR | Status: DC | PRN

## 2016-06-05 MED ORDER — FENTANYL CITRATE (PF) 100 MCG/2ML IJ SOLN
INTRAMUSCULAR | Status: DC | PRN
  Administered 2016-06-05: 25 ug via INTRAVENOUS
  Administered 2016-06-05: 50 ug via INTRAVENOUS
  Administered 2016-06-05: 25 ug via INTRAVENOUS

## 2016-06-05 MED ORDER — SODIUM CHLORIDE 0.9 % IV SOLN
INTRAVENOUS | Status: DC
  Administered 2016-06-05: 21:00:00 via INTRAVENOUS

## 2016-06-05 MED ORDER — MIDAZOLAM HCL 2 MG/2ML IJ SOLN
INTRAMUSCULAR | Status: AC
  Filled 2016-06-05: qty 2

## 2016-06-05 MED ORDER — LIDOCAINE HCL (CARDIAC) 20 MG/ML IV SOLN
INTRAVENOUS | Status: DC | PRN
  Administered 2016-06-05: 50 mg via INTRAVENOUS

## 2016-06-05 MED ORDER — ACETAMINOPHEN 325 MG PO TABS: 650.0000 mg | ORAL_TABLET | Freq: Four times a day (QID) | ORAL | Status: DC | PRN

## 2016-06-05 MED ORDER — LACTATED RINGERS IV SOLN
INTRAVENOUS | Status: DC | PRN
  Administered 2016-06-05: 1000 mL
  Administered 2016-06-05: 15:00:00 via INTRAVENOUS

## 2016-06-05 MED ORDER — MIDAZOLAM HCL 5 MG/5ML IJ SOLN
INTRAMUSCULAR | Status: DC | PRN
  Administered 2016-06-05: 2 mg via INTRAVENOUS

## 2016-06-05 MED ORDER — PHENYLEPHRINE 40 MCG/ML (10ML) SYRINGE FOR IV PUSH (FOR BLOOD PRESSURE SUPPORT)
PREFILLED_SYRINGE | INTRAVENOUS | Status: AC
  Filled 2016-06-05: qty 10

## 2016-06-05 MED ORDER — METHOCARBAMOL 500 MG PO TABS
500.0000 mg | ORAL_TABLET | Freq: Four times a day (QID) | ORAL | Status: DC | PRN
  Administered 2016-06-05 – 2016-06-06 (×2): 500 mg via ORAL
  Filled 2016-06-05 (×2): qty 1

## 2016-06-05 MED ORDER — PROPOFOL 10 MG/ML IV BOLUS
INTRAVENOUS | Status: DC | PRN
  Administered 2016-06-05: 100 mg via INTRAVENOUS

## 2016-06-05 MED ORDER — ONDANSETRON HCL 4 MG PO TABS: 4.0000 mg | ORAL_TABLET | Freq: Four times a day (QID) | ORAL | Status: DC | PRN

## 2016-06-05 MED ORDER — POLYETHYLENE GLYCOL 3350 17 G PO PACK: 17.0000 g | PACK | Freq: Every day | ORAL | Status: DC | PRN

## 2016-06-05 MED ORDER — OXYCODONE HCL 5 MG PO TABS
5.0000 mg | ORAL_TABLET | ORAL | Status: DC | PRN
  Administered 2016-06-05 – 2016-06-06 (×2): 10 mg via ORAL
  Filled 2016-06-05 (×2): qty 2

## 2016-06-05 MED ORDER — DEXTROSE 5 % IV SOLN
500.0000 mg | Freq: Four times a day (QID) | INTRAVENOUS | Status: DC | PRN
  Filled 2016-06-05: qty 5

## 2016-06-05 MED ORDER — SODIUM CHLORIDE 0.9 % IR SOLN
Status: DC | PRN
  Administered 2016-06-05: 1000 mL

## 2016-06-05 MED ORDER — HYDROMORPHONE HCL 1 MG/ML IJ SOLN
INTRAMUSCULAR | Status: AC
  Filled 2016-06-05: qty 0.5

## 2016-06-05 MED ORDER — ONDANSETRON HCL 4 MG/2ML IJ SOLN
INTRAMUSCULAR | Status: DC | PRN
  Administered 2016-06-05: 4 mg via INTRAVENOUS

## 2016-06-05 MED ORDER — HYDROMORPHONE HCL 1 MG/ML IJ SOLN
1.0000 mg | INTRAMUSCULAR | Status: DC | PRN
  Administered 2016-06-06 – 2016-06-07 (×4): 1 mg via INTRAVENOUS
  Filled 2016-06-05 (×4): qty 1

## 2016-06-05 MED ORDER — METOCLOPRAMIDE HCL 5 MG PO TABS: 5.0000 mg | ORAL_TABLET | Freq: Three times a day (TID) | ORAL | Status: DC | PRN

## 2016-06-05 MED ORDER — FENTANYL CITRATE (PF) 100 MCG/2ML IJ SOLN
INTRAMUSCULAR | Status: AC
  Filled 2016-06-05: qty 2

## 2016-06-05 MED ORDER — ACETAMINOPHEN 650 MG RE SUPP: 650.0000 mg | Freq: Four times a day (QID) | RECTAL | Status: DC | PRN

## 2016-06-05 SURGICAL SUPPLY — 37 items
BAG ZIPLOCK 12X15 (MISCELLANEOUS) ×3 IMPLANT
BANDAGE ESMARK 6X9 LF (GAUZE/BANDAGES/DRESSINGS) ×1 IMPLANT
BNDG COHESIVE 3X5 TAN STRL LF (GAUZE/BANDAGES/DRESSINGS) ×3 IMPLANT
BNDG COHESIVE 6X5 TAN STRL LF (GAUZE/BANDAGES/DRESSINGS) ×3 IMPLANT
BNDG ESMARK 6X9 LF (GAUZE/BANDAGES/DRESSINGS) ×3
CUFF TOURN SGL QUICK 34 (TOURNIQUET CUFF) ×2
CUFF TRNQT CYL 34X4X40X1 (TOURNIQUET CUFF) ×1 IMPLANT
DRAPE EXTREMITY TIBURON (DRAPES) ×3 IMPLANT
DRAPE INCISE IOBAN 85X60 (DRAPES) ×3 IMPLANT
DRAPE SHEET LG 3/4 BI-LAMINATE (DRAPES) ×3 IMPLANT
DRAPE SURG 17X11 SM STRL (DRAPES) ×6 IMPLANT
DRAPE U-SHAPE 47X51 STRL (DRAPES) ×6 IMPLANT
DRSG ADAPTIC 3X8 NADH LF (GAUZE/BANDAGES/DRESSINGS) ×3 IMPLANT
DURAPREP 26ML APPLICATOR (WOUND CARE) ×3 IMPLANT
ELECT REM PT RETURN 15FT ADLT (MISCELLANEOUS) ×3 IMPLANT
GAUZE SPONGE 4X4 12PLY STRL (GAUZE/BANDAGES/DRESSINGS) ×6 IMPLANT
GLOVE BIOGEL PI IND STRL 7.0 (GLOVE) ×1 IMPLANT
GLOVE BIOGEL PI IND STRL 8.5 (GLOVE) ×1 IMPLANT
GLOVE BIOGEL PI INDICATOR 7.0 (GLOVE) ×2
GLOVE BIOGEL PI INDICATOR 8.5 (GLOVE) ×2
GLOVE SURG ORTHO 9.0 STRL STRW (GLOVE) ×3 IMPLANT
GLOVE SURG SS PI 7.0 STRL IVOR (GLOVE) ×3 IMPLANT
GOWN STRL REUS W/ TWL XL LVL3 (GOWN DISPOSABLE) ×2 IMPLANT
GOWN STRL REUS W/TWL XL LVL3 (GOWN DISPOSABLE) ×4
HANDPIECE INTERPULSE COAX TIP (DISPOSABLE) ×2
KIT BASIN OR (CUSTOM PROCEDURE TRAY) ×3 IMPLANT
MANIFOLD NEPTUNE II (INSTRUMENTS) ×3 IMPLANT
PACK TOTAL JOINT (CUSTOM PROCEDURE TRAY) ×3 IMPLANT
PAD CAST 4YDX4 CTTN HI CHSV (CAST SUPPLIES) ×1 IMPLANT
PADDING CAST COTTON 4X4 STRL (CAST SUPPLIES) ×2
POSITIONER SURGICAL ARM (MISCELLANEOUS) ×6 IMPLANT
PREVENA INCISION MGT 90 150 (MISCELLANEOUS) ×3 IMPLANT
SET HNDPC FAN SPRY TIP SCT (DISPOSABLE) ×1 IMPLANT
STOCKINETTE 8 INCH (MISCELLANEOUS) ×3 IMPLANT
SUCTION FRAZIER HANDLE 10FR (MISCELLANEOUS) ×2
SUCTION TUBE FRAZIER 10FR DISP (MISCELLANEOUS) ×1 IMPLANT
SUT ETHILON 2 0 PSLX (SUTURE) ×6 IMPLANT

## 2016-06-05 NOTE — Progress Notes (Signed)
Date:  June 05, 2016 Chart reviewed for concurrent status and case management needs. Will continue to follow patient progress. Discharge Planning: following for needs Expected discharge date: [id number] Jalah Warmuth, BSN, RN3, CCM   [phone] 

## 2016-06-05 NOTE — Anesthesia Procedure Notes (Signed)
Procedure Name: LMA Insertion Date/Time: 06/05/2016 4:50 PM Performed by: Glory Buff Pre-anesthesia Checklist: Emergency Drugs available, Patient identified, Suction available and Patient being monitored Patient Re-evaluated:Patient Re-evaluated prior to inductionOxygen Delivery Method: Circle system utilized Preoxygenation: Pre-oxygenation with 100% oxygen Intubation Type: IV induction LMA: LMA inserted LMA Size: 4.0 Number of attempts: 1 Placement Confirmation: positive ETCO2 and breath sounds checked- equal and bilateral Tube secured with: Tape Dental Injury: Teeth and Oropharynx as per pre-operative assessment

## 2016-06-05 NOTE — Anesthesia Postprocedure Evaluation (Signed)
Anesthesia Post Note  Patient: Gina [name]  Procedure(s) Performed: Procedure(s) (LRB): LEFT FOOT TRANSMETATARSAL AMPUTATION (Left)  Patient location during evaluation: PACU Anesthesia Type: General Level of consciousness: awake and alert Pain management: pain level controlled Vital Signs Assessment: post-procedure vital signs reviewed and stable Respiratory status: spontaneous breathing, nonlabored ventilation, respiratory function stable and patient connected to nasal cannula oxygen Cardiovascular status: blood pressure returned to baseline and stable Postop Assessment: no signs of nausea or vomiting Anesthetic complications: no       Last Vitals:  Vitals:   06/05/16 1815 06/05/16 1828  BP: (!) 96/53   Pulse: 77 78  Resp: (!) 9 12  Temp:  36.6 C    Last Pain:  Vitals:   06/05/16 1828  TempSrc:   PainSc: Asleep                 Jessilynn Taft A.

## 2016-06-05 NOTE — Anesthesia Preprocedure Evaluation (Addendum)
Anesthesia Evaluation  Patient identified by MRN, date of birth, ID band Patient awake    Reviewed: Allergy & Precautions, NPO status , Patient's Chart, lab work & pertinent test results  Airway Mallampati: III       Dental no notable dental hx. (+) Teeth Intact, Poor Dentition   Pulmonary neg pulmonary ROS,    Pulmonary exam normal breath sounds clear to auscultation       Cardiovascular hypertension, Pt. on medications Normal cardiovascular exam Rhythm:Regular Rate:Normal     Neuro/Psych Depression negative neurological ROS     GI/Hepatic negative GI ROS, Neg liver ROS,   Endo/Other  diabetes, Poorly Controlled, Type 2, Insulin Dependent, Oral Hypoglycemic AgentsSevere protein calorie malnutrition  Renal/GU negative Renal ROS  negative genitourinary   Musculoskeletal  (+) Arthritis , Osteoarthritis,  Osteomyelitis left forefoot   Abdominal   Peds  Hematology  (+) anemia ,   Anesthesia Other Findings   Reproductive/Obstetrics                             Anesthesia Physical Anesthesia Plan  ASA: III  Anesthesia Plan: General   Post-op Pain Management:    Induction: Intravenous  Airway Management Planned: LMA  Additional Equipment:   Intra-op Plan:   Post-operative Plan: Extubation in OR  Informed Consent: I have reviewed the patients History and Physical, chart, labs and discussed the procedure including the risks, benefits and alternatives for the proposed anesthesia with the patient or authorized representative who has indicated his/her understanding and acceptance.   Dental advisory given  Plan Discussed with: Anesthesiologist, CRNA and Surgeon  Anesthesia Plan Comments:         Anesthesia Quick Evaluation

## 2016-06-05 NOTE — Op Note (Signed)
06/01/2016 - 06/05/2016  5:34 PM  PATIENT:  Gina [name]    PRE-OPERATIVE DIAGNOSIS:  infection left foot  POST-OPERATIVE DIAGNOSIS:  Same  PROCEDURE:  LEFT FOOT TRANSMETATARSAL AMPUTATION  SURGEON:  [name], MD  PHYSICIAN ASSISTANT:None ANESTHESIA:   General  PREOPERATIVE INDICATIONS:  Gina [name] is a  56 y.o. female with a diagnosis of infection left foot who failed conservative measures and elected for surgical management.    The risks benefits and alternatives were discussed with the patient preoperatively including but not limited to the risks of infection, bleeding, nerve injury, cardiopulmonary complications, the need for revision surgery, among others, and the patient was willing to proceed.  OPERATIVE IMPLANTS: Prevena wound VAC  OPERATIVE FINDINGS: Deep abscess that included all the metatarsal heads both dorsal and plantar.  OPERATIVE PROCEDURE: Patient brought the operating room and underwent a general anesthetic. After adequate levels anesthesia obtained patient's left lower extremity was prepped using DuraPrep draped into a sterile field a timeout was called. Examination patient had abscess both plantar and distal midfoot. A transmetatarsal amputation incision was made this was then carried more proximally due to the abscess extending more proximally with more necrotic tissue. A fishmouth incision was made a transmetatarsal amputation was performed with an oscillating saw. Patient had extreme calcified vessels with difficulty obtaining hemostasis with coagulation. All necrotic muscle was excised with a Ronjair and knife. The remaining muscle was good and healthy there is no evidence of residual infection. The wound was then irrigated with pulsatile lavage. The wound was closed using 2-0 nylon. A Prevena wound VAC was applied this had a good suction fit patient was extubated taken the PACU in stable condition  Patient will need to be nonweightbearing on her left  foot for 2 weeks. Would recommend continuing IV antibiotics for 48 hours postoperatively and patient will be discharged on the portable Prevena wound VAC at time of discharge the large VAC will be removed and the Lajas attached.

## 2016-06-05 NOTE — Interval H&P Note (Signed)
History and Physical Interval Note:  06/05/2016 4:41 PM  Gina [name]  has presented today for surgery, with the diagnosis of infection left foot  The various methods of treatment have been discussed with the patient and family. After consideration of risks, benefits and other options for treatment, the patient has consented to  Procedure(s): IRRIGATION AND DEBRIDEMENT OF LEFT FOOT VS. TRANSMETATARSAL AMPUTATION LEFT (Left) as a surgical intervention .  The patient's history has been reviewed, patient examined, no change in status, stable for surgery.  I have reviewed the patient's chart and labs.  Questions were answered to the patient's satisfaction.     [name]

## 2016-06-05 NOTE — Progress Notes (Signed)
PROGRESS NOTE    Gina [name]   YSA:[id number]  DOB: 05/07/1960  DOA: 06/01/2016 PCP: Fredia Beets, FNP   Brief Narrative:  Gina [name] is a 56 y.o. female with medical history significant of poorly controlled diabetes mellitus with most recent hemoglobin A1c of greater than 15, hypertension, presents to the emergency room with chief complaint of left foot swelling and redness, as well as pain with ambulation.  Patient has a history of type 2 diabetes mellitus, however has been homeless up until about couple of months ago and has been without her medications for quite some time.  Was only taking Metformin and Lexapro at home and no insulin.   Over the last couple weeks, she has been complaining of left foot swelling which has been progressive.  She was in the emergency room about 8 days ago, she was evaluated and placed on 2 different oral antibiotics (bactrim and keflex per records) and sent home.  Despite taking oral antibiotics, her swelling has progressed and her pain has increased into her left foot.  She denies any systemic symptoms, she has no fever or chills, she has no abdominal pain, no nausea, vomiting or diarrhea.  She denies any chest pain or shortness of breath.  Subjective: No new complaints.   ROS: negative  Assessment & Plan:   Principal Problem:   Osteomyelitis due to type 2 diabetes mellitus    Septic arthritis of interphalangeal joint of toe of left foot - MRI report below- showing left 2nd toe septic arthritis and osteomyelitis and fluid collections in foot - consulted Dr Sharol Given, obtained ABIs (are normal) and on Dr Jess Barters requested, consulted vascular surgery as well- Dr Donzetta Matters does not feel she needs any vascular intervention - cont Vanc, Rocephin and Flagyl - blood cultures NGTD- no organism seen on body fluid culture obtained yesterday - will go to OR today  Active Problems:   Type 2 diabetes mellitus - severely uncontrolled with A1c > 15 (note at it was  the same in 2015 when she was supposedly on Insulin) - has only been taking Metformin and not Insulin due to cost - sugars improved on 30 U Lantus- cont SSI - discussed need for control in detail with patient    Essential hypertension  - Lisinopril added  Anemia - check anemia panel in AM  Depression - cont Lexapro   Severe Protein calorie Malnutrition - nutritionist added Glucerna    DVT prophylaxis: Lovenox Code Status: Full code Family Communication:  Disposition Plan: follow on med/surg Consultants:   Ortho  Vascular sx Procedures:    Antimicrobials:  Anti-infectives    Start     Dose/Rate Route Frequency Ordered Stop   06/04/16 0800  vancomycin (VANCOCIN) IVPB 1000 mg/200 mL premix     1,000 mg 200 mL/hr over 60 Minutes Intravenous Every 12 hours 06/04/16 0649     06/02/16 2100  metroNIDAZOLE (FLAGYL) IVPB 500 mg     500 mg 100 mL/hr over 60 Minutes Intravenous Every 8 hours 06/01/16 1834     06/02/16 0600  vancomycin (VANCOCIN) IVPB 750 mg/150 ml premix  Status:  Discontinued     750 mg 150 mL/hr over 60 Minutes Intravenous Every 12 hours 06/01/16 1832 06/04/16 0649   06/02/16 0400  vancomycin (VANCOCIN) IVPB 750 mg/150 ml premix  Status:  Discontinued     750 mg 150 mL/hr over 60 Minutes Intravenous Every 12 hours 06/01/16 1624 06/01/16 1832   06/01/16 2000  cefTRIAXone (ROCEPHIN) 2 g in dextrose  5 % 50 mL IVPB     2 g 100 mL/hr over 30 Minutes Intravenous Every 24 hours 06/01/16 1834     06/01/16 1715  cefTRIAXone (ROCEPHIN) 2 g in dextrose 5 % 50 mL IVPB  Status:  Discontinued     2 g 100 mL/hr over 30 Minutes Intravenous Every 24 hours 06/01/16 1712 06/01/16 1834   06/01/16 1715  metroNIDAZOLE (FLAGYL) IVPB 500 mg  Status:  Discontinued     500 mg 100 mL/hr over 60 Minutes Intravenous Every 8 hours 06/01/16 1712 06/01/16 1834   06/01/16 1515  vancomycin (VANCOCIN) IVPB 1000 mg/200 mL premix     1,000 mg 200 mL/hr over 60 Minutes Intravenous  Once  06/01/16 1504 06/01/16 1816   06/01/16 1500  piperacillin-tazobactam (ZOSYN) IVPB 3.375 g     3.375 g 12.5 mL/hr over 240 Minutes Intravenous  Once 06/01/16 1458 06/01/16 1702       Objective: Vitals:   06/04/16 0631 06/04/16 1410 06/04/16 2039 06/05/16 0423  BP: 127/84 100/65 108/66 106/70  Pulse: 78 89 88 80  Resp: 18 16 17 17   Temp: 97.9 F (36.6 C) 98.5 F (36.9 C) 98.2 F (36.8 C) 98.1 F (36.7 C)  TempSrc: Oral Oral Oral Oral  SpO2: 100% 99% 99% 100%  Weight:      Height:        Intake/Output Summary (Last 24 hours) at 06/05/16 1202 Last data filed at 06/05/16 0347  Gross per 24 hour  Intake             1270 ml  Output                2 ml  Net             1268 ml   Filed Weights   06/01/16 1458  Weight: 58.1 kg (128 lb)    Examination: General exam: Appears comfortable  HEENT: PERRLA, oral mucosa moist, no sclera icterus or thrush Respiratory system: Clear to auscultation. Respiratory effort normal. Cardiovascular system: S1 & S2 heard, RRR.  No murmurs  Gastrointestinal system: Abdomen soft, non-tender, nondistended. Normal bowel sound. No organomegaly Central nervous system: Alert and oriented. No focal neurological deficits. Extremities: No cyanosis, clubbing - 2+ edema left foot and toes with erythema and tenderness Skin: No ulcers Psychiatry:  Mood & affect appropriate.     Data Reviewed: I have personally reviewed following labs and imaging studies  CBC:  Recent Labs Lab 06/01/16 1526 06/02/16 0537 06/03/16 0537 06/05/16 0532  WBC 13.4* 13.0* 12.6* 13.3*  NEUTROABS 10.7*  --   --   --   HGB 12.3 10.7* 9.9* 9.4*  HCT 37.2 32.2* 29.8* 28.1*  MCV 88.8 89.2 88.4 89.8  PLT 423* 373 363 425   Basic Metabolic Panel:  Recent Labs Lab 06/01/16 1526 06/02/16 0537 06/03/16 0537 06/04/16 0531 06/05/16 0532  NA 136 135  --   --  133*  K 3.6 4.0  --   --  4.4  CL 98* 101  --   --  104  CO2 27 27  --   --  24  GLUCOSE 325* 287*  --   --   173*  BUN 13 15  --   --  15  CREATININE 0.57 0.47 0.46 0.44 0.37*  CALCIUM 9.4 9.0  --   --  8.7*   GFR: Estimated Creatinine Clearance: 62.8 mL/min (A) (by C-G formula based on SCr of 0.37 mg/dL (L)). Liver Function Tests:  Recent Labs Lab 06/01/16 1526 06/02/16 0537  AST 18 15  ALT 18 15  ALKPHOS 98 66  BILITOT 0.7 0.5  PROT 9.3* 7.2  ALBUMIN 3.2* 2.4*   No results for input(s): LIPASE, AMYLASE in the last 168 hours. No results for input(s): AMMONIA in the last 168 hours. Coagulation Profile: No results for input(s): INR, PROTIME in the last 168 hours. Cardiac Enzymes: No results for input(s): CKTOTAL, CKMB, CKMBINDEX, TROPONINI in the last 168 hours. BNP (last 3 results) No results for input(s): PROBNP in the last 8760 hours. HbA1C: No results for input(s): HGBA1C in the last 72 hours. CBG:  Recent Labs Lab 06/04/16 1129 06/04/16 1656 06/04/16 2220 06/05/16 0739 06/05/16 1136  GLUCAP 229* 264* 209* 117* 257*   Lipid Profile: No results for input(s): CHOL, HDL, LDLCALC, TRIG, CHOLHDL, LDLDIRECT in the last 72 hours. Thyroid Function Tests: No results for input(s): TSH, T4TOTAL, FREET4, T3FREE, THYROIDAB in the last 72 hours. Anemia Panel: No results for input(s): VITAMINB12, FOLATE, FERRITIN, TIBC, IRON, RETICCTPCT in the last 72 hours. Urine analysis:    Component Value Date/Time   COLORURINE YELLOW 04/01/2016 1051   APPEARANCEUR HAZY (A) 04/01/2016 1051   LABSPEC 1.032 (H) 04/01/2016 1051   PHURINE 6.0 04/01/2016 1051   GLUCOSEU >=500 (A) 04/01/2016 1051   HGBUR SMALL (A) 04/01/2016 1051   BILIRUBINUR N 04/28/2016 1603   KETONESUR 20 (A) 04/01/2016 1051   PROTEINUR N 04/28/2016 1603   PROTEINUR 30 (A) 04/01/2016 1051   UROBILINOGEN 0.2 04/28/2016 1603   UROBILINOGEN 0.2 06/18/2013 0650   NITRITE p 04/28/2016 1603   NITRITE POSITIVE (A) 04/01/2016 1051   LEUKOCYTESUR Negative 04/28/2016 1603   Sepsis  Labs: @LABRCNTIP (procalcitonin:4,lacticidven:4) ) Recent Results (from the past 240 hour(s))  Culture, blood (single)     Status: None (Preliminary result)   Collection Time: 06/01/16  3:27 PM  Result Value Ref Range Status   Specimen Description BLOOD RIGHT ANTECUBITAL  Final   Special Requests BOTTLES DRAWN AEROBIC AND ANAEROBIC 5ML  Final   Culture   Final    NO GROWTH 3 DAYS Performed at [facility], [place] [address] [place], [place] [address]    Report Status PENDING  Incomplete  Body fluid culture     Status: None (Preliminary result)   Collection Time: 06/04/16 12:09 PM  Result Value Ref Range Status   Specimen Description FOOT LEFT  Final   Special Requests Normal  Final   Gram Stain   Final    MODERATE WBC PRESENT, PREDOMINANTLY PMN NO ORGANISMS SEEN Performed at [facility], [place] [address] [place], [place] [address]    Culture PENDING  Incomplete   Report Status PENDING  Incomplete  MRSA PCR Screening     Status: None   Collection Time: 06/04/16  4:35 PM  Result Value Ref Range Status   MRSA by PCR NEGATIVE NEGATIVE Final    Comment:        The GeneXpert MRSA Assay (FDA approved for NASAL specimens only), is one component of a comprehensive MRSA colonization surveillance program. It is not intended to diagnose MRSA infection nor to guide or monitor treatment for MRSA infections.          Radiology Studies: No results found.    Scheduled Meds: . cefTRIAXone (ROCEPHIN)  IV  2 g Intravenous Q24H   And  . metronidazole  500 mg Intravenous Q8H  . chlorhexidine  60 mL Topical Once  . enoxaparin (LOVENOX) injection  40 mg Subcutaneous Q24H  . escitalopram  10 mg Oral Daily  . feeding supplement (GLUCERNA SHAKE)  237 mL Oral TID BM  . insulin aspart  0-15 Units Subcutaneous TID WC  . insulin aspart  0-5 Units Subcutaneous QHS  . insulin glargine  30 Units Subcutaneous QHS  . lisinopril  2.5 mg Oral Daily  . multivitamin with minerals  1  tablet Oral Daily  . vancomycin  1,000 mg Intravenous Q12H   Continuous Infusions:   LOS: 4 days    Time spent in minutes: 40    [place], MD [facility] Pager: [email or web address] Password [facility] 06/05/2016, 12:02 PM

## 2016-06-05 NOTE — H&P (View-Only) (Signed)
ORTHOPAEDIC CONSULTATION  REQUESTING PHYSICIAN: [name], MD  Chief Complaint: Left foot pain swelling.  HPI: Gina [name] is a 56 y.o. female who presents with cellulitis and swelling of the left forefoot. Patient states that she stepped on a tack and has subsequently developed the redness swelling and pain.  Past Medical History:  Diagnosis Date  . Diabetes mellitus   . Hypertension    Past Surgical History:  Procedure Laterality Date  . TUBAL LIGATION     Social History   Social History  . Marital status: Single    Spouse name: N/A  . Number of children: N/A  . Years of education: N/A   Social History Main Topics  . Smoking status: Never Smoker  . Smokeless tobacco: Never Used  . Alcohol use No     Comment: occasionally  . Drug use: No  . Sexual activity: Not Asked   Other Topics Concern  . None   Social History Narrative  . None   Family History  Problem Relation Age of Onset  . Diabetes type II Father   . CAD Father   . Prostate cancer Father   . Diabetes Mellitus II Brother    - negative except otherwise stated in the family history section Allergies  Allergen Reactions  . Metformin And Related Other (See Comments)    Upset stomach   Prior to Admission medications   Medication Sig Start Date End Date Taking? Authorizing Provider  escitalopram (LEXAPRO) 10 MG tablet Take 1 tablet (10 mg total) by mouth daily. After 1 week may increase take 2 tablets (20 mg total) by mouth daily. Patient taking differently: Take 10 mg by mouth daily.  04/28/16  Yes Alfonse Spruce, FNP  metFORMIN (GLUCOPHAGE-XR) 500 MG 24 hr tablet Take 2 tablets (1,000 mg total) by mouth 2 (two) times daily. 04/28/16  Yes Alfonse Spruce, FNP  glucose blood test strip Use as instructed 04/28/16   Alfonse Spruce, FNP  glucose monitoring kit (FREESTYLE) monitoring kit Use glucometer to check sugar three times a day 02/17/14   Tresa Garter, MD  Lancets 30G MISC  Use to check blood sugar three times a day 02/17/14   Tresa Garter, MD  Syringe/Needle, Disp, (PRODIGY SAFETY SYRINGES) 29G X 1/2" 1 ML MISC Use syringe to inject insulin twice a day 02/17/14   Tresa Garter, MD   No results found. - pertinent xrays, CT, MRI studies were reviewed and independently interpreted  Positive ROS: All other systems have been reviewed and were otherwise negative with the exception of those mentioned in the HPI and as above.  Physical Exam: General: Alert, no acute distress Psychiatric: Patient is competent for consent with normal mood and affect Lymphatic: No axillary or cervical lymphadenopathy Cardiovascular: No pedal edema Respiratory: No cyanosis, no use of accessory musculature GI: No organomegaly, abdomen is soft and non-tender  Skin: Patient has cellulitis involving the forefoot with swelling and atrophic skin   Neurologic: Patient does not have protective sensation bilateral lower extremities.   MUSCULOSKELETAL:  Examination patient is a good dorsalis pedis pulse bilaterally. There is no swelling in the right lower extremity where foot she has swelling of the forefoot with cellulitis. She does have a evidence of a puncture wound on the plantar aspect of her foot beneath the second metatarsal head. After informed consent and 18-gauge needle after sterile prepping was inserted into the dorsum of the foot over the second metatarsal where there is an area  of cellulitic swelling. Purulent drainage was encountered. This was sent for cultures. Review the MRI scan does show fluid collection around the metatarsal heads with evidence of osteomyelitis. Review of the radiographs shows some chronic cystic periarticular changes consistent with possible chronic gout.  Assessment: Assessment: Diabetic insensate neuropathy with caloric malnutrition with abscess and osteomyelitis of the left forefoot  Plan: Plan: We'll plan for surgery tomorrow after 5 with  irrigation and debridement of the abscess possible transmetatarsal amputation possible placement of an installation wound VAC.  Thank you for the consult and the opportunity to see [name], MD [place] [phone] 12:09 PM

## 2016-06-05 NOTE — Progress Notes (Signed)
Applied 4x4 gauze and kerlex to Left foot d/t drainage.

## 2016-06-05 NOTE — Transfer of Care (Signed)
Immediate Anesthesia Transfer of Care Note  Patient: Gina [name]  Procedure(s) Performed: Procedure(s): LEFT FOOT TRANSMETATARSAL AMPUTATION (Left)  Patient Location: PACU  Anesthesia Type:General  Level of Consciousness: awake, alert  and oriented  Airway & Oxygen Therapy: Patient Spontanous Breathing and Patient connected to face mask oxygen  Post-op Assessment: Report given to RN and Post -op Vital signs reviewed and stable  Post vital signs: Reviewed and stable  Last Vitals:  Vitals:   06/05/16 0423 06/05/16 1422  BP: 106/70 (!) 159/76  Pulse: 80 78  Resp: 17 17  Temp: 36.7 C 36.9 C    Last Pain:  Vitals:   06/05/16 1422  TempSrc: Oral  PainSc:       Patients Stated Pain Goal: 0 (56/86/16 8372)  Complications: No apparent anesthesia complications

## 2016-06-06 ENCOUNTER — Encounter (HOSPITAL_COMMUNITY): Payer: Self-pay | Admitting: Orthopedic Surgery

## 2016-06-06 DIAGNOSIS — L03116 Cellulitis of left lower limb: Secondary | ICD-10-CM

## 2016-06-06 DIAGNOSIS — E11622 Type 2 diabetes mellitus with other skin ulcer: Secondary | ICD-10-CM

## 2016-06-06 LAB — CULTURE, BLOOD (SINGLE): Culture: NO GROWTH

## 2016-06-06 LAB — BASIC METABOLIC PANEL
Anion gap: 4 — ABNORMAL LOW (ref 5–15)
BUN: 12 mg/dL (ref 6–20)
CO2: 27 mmol/L (ref 22–32)
Calcium: 8.7 mg/dL — ABNORMAL LOW (ref 8.9–10.3)
Chloride: 101 mmol/L (ref 101–111)
Creatinine, Ser: 0.53 mg/dL (ref 0.44–1.00)
GFR calc Af Amer: >60 mL/min (ref >60)
GFR calc non Af Amer: >60 mL/min (ref >60)
Glucose, Bld: 261 mg/dL — ABNORMAL HIGH (ref 65–99)
Potassium: 4.6 mmol/L (ref 3.5–5.1)
SODIUM: 132 mmol/L — AB (ref 135–145)

## 2016-06-06 LAB — GLUCOSE, CAPILLARY
GLUCOSE-CAPILLARY: 229 mg/dL — AB (ref 65–99)
GLUCOSE-CAPILLARY: 321 mg/dL — AB (ref 65–99)
Glucose-Capillary: 238 mg/dL — ABNORMAL HIGH (ref 65–99)

## 2016-06-06 LAB — CBC
HCT: 28.8 % — ABNORMAL LOW (ref 36.0–46.0)
Hemoglobin: 9.3 g/dL — ABNORMAL LOW (ref 12.0–15.0)
MCH: 29.6 pg (ref 26.0–34.0)
MCHC: 32.3 g/dL (ref 30.0–36.0)
MCV: 91.7 fL (ref 78.0–100.0)
PLATELETS: 378 10*3/uL (ref 150–400)
RBC: 3.14 MIL/uL — ABNORMAL LOW (ref 3.87–5.11)
RDW: 13.4 % (ref 11.5–15.5)
WBC: 13.6 10*3/uL — ABNORMAL HIGH (ref 4.0–10.5)

## 2016-06-06 LAB — VITAMIN B12: Vitamin B-12: 626 pg/mL (ref 180–914)

## 2016-06-06 NOTE — Progress Notes (Signed)
CSW met with patient and daughter at bedside, explain role and reason for csw intervention, to discuss discharge plan. Patient does not have insurance to cover the cost of SNF placement. Patient daughter reports she is willing to take patient home and learn how to care for wound.  PT recommends benefit of skilled care but pt.cannot afford to pay privately at [facility] and Home health services. Patient reports prior to hospitalization she attempted to apply for medicaid but did not qualify because of the new state guidelines. Patient daughter reports she plans to start patient medicaid process again today.  CSW will discuss case with Surveyor, quantity of [place], to help determine disposition.   CSW will continue to assist with patient disposition.    Kathrin Greathouse, Latanya Presser, MSW Clinical Social Worker 5E and Psychiatric Service Line [phone] 06/06/2016  1:48 PM

## 2016-06-06 NOTE — Progress Notes (Signed)
Patient ID: Gina [name], female   DOB: 1960/05/14, 56 y.o.   MRN: [id number] Postoperative day 1 left transmetatarsal amputation. The wound VAC is functioning well there is a minimal amount of drainage in the wound VAC canister. Plan for discharge once patient is safe with ambulation minimal weightbearing left lower extremity. Patient to be discharged on the Prevena portable pump. Patient may need discharge to skilled nursing.

## 2016-06-06 NOTE — Evaluation (Signed)
Physical Therapy Evaluation Patient Details Name: Gina [name] MRN: [id number] DOB: [birth year] Today's Date: 06/06/2016   History of Present Illness  56 y.o. female with a diagnosis of infection left foot s/p L transmetatarsal amputation 06/05/16 with hx of type 2 DM  Clinical Impression  Pt admitted with above diagnosis. Pt currently with functional limitations due to the deficits listed below (see PT Problem List).  Pt will benefit from skilled PT to increase their independence and safety with mobility to allow discharge to the venue listed below.  Pt unable to tolerate dependent position of L LE due to pain so performed lateral scoot transfer to recliner.  Pt will likely perform stand pivot transfers once pain better controlled.  Pt voices concern over ability to care for herself at home.  Uncertain how much home care will be available, so would recommend SNF if assist at home not available.  Pt with NWB orders upon evaluation however updated to TWB.  Would recommend post op or offloading shoe for mobility at this time.     Follow Up Recommendations SNF;Home health PT (if increased care not available at home, pt would benefit from SNF)    Equipment Recommendations  Wheelchair (measurements PT);Rolling walker with 5" wheels;Wheelchair cushion (measurements PT)    Recommendations for Other Services       Precautions / Restrictions Precautions Precautions: Fall Precaution Comments: NPWT Restrictions Weight Bearing Restrictions: Yes LLE Weight Bearing: Touchdown weight bearing Other Position/Activity Restrictions: NWB at time of evaluation, new order for TWB - would benefit from a post op shoe due to wound vac      Mobility  Bed Mobility Overal bed mobility: Modified Independent             General bed mobility comments: pt elevated HOB  Transfers Overall transfer level: Needs assistance Equipment used: Rolling walker (2 wheeled) Transfers: Sit to/from Stand;Lateral/Scoot  Transfers Sit to Stand:  (unable to tolerate L LE dependent position)        Lateral/Scoot Transfers: Min guard General transfer comment: verbal cues for lateral scoot transfer to drop arm recliner, pt unable to tolerate L LE dependent position so unable to stand today, kept L LE propped up on bed while transferring  Ambulation/Gait                Stairs            Wheelchair Mobility    Modified Rankin (Stroke Patients Only)       Balance                                             Pertinent Vitals/Pain Pain Assessment: Faces Faces Pain Scale: Hurts whole lot Pain Location: L foot Pain Descriptors / Indicators: Grimacing;Guarding;Throbbing Pain Intervention(s): Limited activity within patient's tolerance;Monitored during session;Repositioned;RN gave pain meds during session    [place] expects to be discharged to:: Private residence Living Arrangements: Alone Available Help at Discharge: Family;Available PRN/intermittently   Home Access: Level entry     Home Layout: One level Home Equipment: None      Prior Function Level of Independence: Independent               Hand Dominance        Extremity/Trunk Assessment   Upper Extremity Assessment Upper Extremity Assessment: Generalized weakness    Lower Extremity Assessment Lower Extremity Assessment: Generalized  weakness LLE Deficits / Details: s/p transmetatarsal amputation with wound vac in place       Communication   Communication: No difficulties  Cognition Arousal/Alertness: Awake/alert Behavior During Therapy: WFL for tasks assessed/performed Overall Cognitive Status: Within Functional Limits for tasks assessed                                        General Comments      Exercises     Assessment/Plan    PT Assessment Patient needs continued PT services  PT Problem List Decreased strength;Decreased mobility;Decreased  knowledge of precautions;Decreased knowledge of use of DME;Pain       PT Treatment Interventions DME instruction;Gait training;Therapeutic activities;Therapeutic exercise;Patient/family education;Wheelchair mobility training;Functional mobility training    PT Goals (Current goals can be found in the Care Plan section)  Acute Rehab PT Goals PT Goal Formulation: With patient/family Time For Goal Achievement: 06/13/16 Potential to Achieve Goals: Good    Frequency Min 3X/week   Barriers to discharge        Co-evaluation               End of Session Equipment Utilized During Treatment: Gait belt Activity Tolerance: Patient limited by pain Patient left: in chair;with call bell/phone within reach;with chair alarm set;with family/visitor present Nurse Communication: Mobility status PT Visit Diagnosis: Other abnormalities of gait and mobility (R26.89);Pain Pain - Right/Left: Left Pain - part of body: Ankle and joints of foot    Time: 3646-8032 PT Time Calculation (min) (ACUTE ONLY): 22 min   Charges:   PT Evaluation $PT Eval Moderate Complexity: 1 Procedure     PT G Codes:        Carmelia Bake, PT, DPT 06/06/2016 Pager: [phone]   York Ram E 06/06/2016, 12:32 PM

## 2016-06-06 NOTE — Progress Notes (Addendum)
PROGRESS NOTE    Gina [name]   FVC:[id number]  DOB: December 04, 1960  DOA: 06/01/2016 PCP: Fredia Beets, FNP   Brief Narrative:  Gina [name] is a 56 y.o. female with medical history significant of poorly controlled diabetes mellitus with most recent hemoglobin A1c of greater than 15, hypertension, presents to the emergency room with chief complaint of left foot swelling and redness, as well as pain with ambulation.  Patient has a history of type 2 diabetes mellitus, however has been homeless up until about couple of months ago and has been without her medications for quite some time.  Was only taking Metformin and Lexapro at home and no insulin.   Over the last couple weeks, she has been complaining of left foot swelling which has been progressive after stepping on a tack.  She was in the emergency room about 8 days ago, she was evaluated and placed on 2 different oral antibiotics (bactrim and keflex per records) and sent home.  Despite taking oral antibiotics, her swelling has progressed and her pain has increased into her left foot.  She denies any systemic symptoms, she has no fever or chills, she has no abdominal pain, no nausea, vomiting or diarrhea.  She denies any chest pain or shortness of breath.  Subjective: Pain in right foot. No other complaints.  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  -  cont Vanc, Rocephin and Flagyl x 48 hrs Nonweightbearing on left foot- will go home with "Prevana wound VAC"- will go home and not SNF due to cost NOTE: during surgery, vessels were noted to be heavily calcified   Active Problems:   Type 2 diabetes mellitus - severely uncontrolled with A1c > 15 (note at it was the same in 2015 when she was supposedly on Insulin) - has only been taking Metformin and not Insulin due to cost - sugars improved on 30 U Lantus- cont SSI - discussed need for control in detail with patient    Essential hypertension  - Lisinopril added  Anemia - anemia panel consistent with AOCD- retic count not elevated  Depression - cont Lexapro   Severe Protein calorie Malnutrition - nutritionist added Glucerna    DVT prophylaxis: Lovenox Code Status: Full code Family Communication:  Disposition Plan: follow on med/surg- cannot afford SNF- will go home with daughter Consultants:   Ortho  Vascular sx Procedures:   3/26- LEFT FOOT TRANSMETATARSAL AMPUTATION Antimicrobials:  Anti-infectives    Start     Dose/Rate Route Frequency Ordered Stop   06/04/16 0800  vancomycin (VANCOCIN) IVPB 1000 mg/200 mL premix     1,000 mg 200 mL/hr over 60 Minutes Intravenous Every 12 hours 06/04/16 0649     06/02/16 2100  metroNIDAZOLE (FLAGYL) IVPB 500 mg     500 mg 100 mL/hr over 60 Minutes Intravenous Every 8 hours 06/01/16 1834     06/02/16 0600  vancomycin (VANCOCIN) IVPB 750  mg/150 ml premix  Status:  Discontinued     750 mg 150 mL/hr over 60 Minutes Intravenous Every 12 hours 06/01/16 1832 06/04/16 0649   06/02/16 0400  vancomycin (VANCOCIN) IVPB 750 mg/150 ml premix  Status:  Discontinued     750 mg 150 mL/hr over 60 Minutes Intravenous Every 12 hours 06/01/16 1624 06/01/16 1832   06/01/16 2000  cefTRIAXone (ROCEPHIN) 2 g in dextrose 5 % 50 mL IVPB     2 g 100 mL/hr over 30 Minutes Intravenous Every 24 hours 06/01/16 1834     06/01/16 1715  cefTRIAXone (ROCEPHIN) 2 g  in dextrose 5 % 50 mL IVPB  Status:  Discontinued     2 g 100 mL/hr over 30 Minutes Intravenous Every 24 hours 06/01/16 1712 06/01/16 1834   06/01/16 1715  metroNIDAZOLE (FLAGYL) IVPB 500 mg  Status:  Discontinued     500 mg 100 mL/hr over 60 Minutes Intravenous Every 8 hours 06/01/16 1712 06/01/16 1834   06/01/16 1515  vancomycin (VANCOCIN) IVPB 1000 mg/200 mL premix     1,000 mg 200 mL/hr over 60 Minutes Intravenous  Once 06/01/16 1504 06/01/16 1816   06/01/16 1500  piperacillin-tazobactam (ZOSYN) IVPB 3.375 g     3.375 g 12.5 mL/hr over 240 Minutes Intravenous  Once 06/01/16 1458 06/01/16 1702       Objective: Vitals:   06/05/16 1845 06/05/16 2232 06/06/16 0615 06/06/16 1445  BP: (!) 111/55 104/62 132/70 (!) 100/59  Pulse: 83 77 77 92  Resp: 13 16 18 18   Temp: 97.8 F (36.6 C) 98 F (36.7 C) 97.6 F (36.4 C) 98.2 F (36.8 C)  TempSrc:  Oral Oral Oral  SpO2: 100% 100% 100% 99%  Weight:      Height:        Intake/Output Summary (Last 24 hours) at 06/06/16 1610 Last data filed at 06/06/16 4098  Gross per 24 hour  Intake             1435 ml  Output               20 ml  Net             1415 ml   Filed Weights   06/01/16 1458  Weight: 58.1 kg (128 lb)    Examination: General exam: Appears comfortable  HEENT: PERRLA, oral mucosa moist, no sclera icterus or thrush Respiratory system: Clear to auscultation. Respiratory effort normal. Cardiovascular system: S1 & S2 heard, RRR.  No murmurs  Gastrointestinal system: Abdomen soft, non-tender, nondistended. Normal bowel sound. No organomegaly Central nervous system: Alert and oriented. No focal neurological deficits. Extremities: No cyanosis, clubbing - s/p L transmetatarsal resection- wound vac in place Skin: No ulcers Psychiatry:  Mood & affect appropriate.     Data Reviewed: I have personally reviewed following labs and imaging studies  CBC:  Recent Labs Lab 06/01/16 1526 06/02/16 0537 06/03/16 0537  06/05/16 0532 06/06/16 0607  WBC 13.4* 13.0* 12.6* 13.3* 13.6*  NEUTROABS 10.7*  --   --   --   --   HGB 12.3 10.7* 9.9* 9.4* 9.3*  HCT 37.2 32.2* 29.8* 28.1* 28.8*  MCV 88.8 89.2 88.4 89.8 91.7  PLT 423* 373 363 372 119   Basic Metabolic Panel:  Recent Labs Lab 06/01/16 1526 06/02/16 0537 06/03/16 0537 06/04/16 0531 06/05/16 0532 06/06/16 0607  NA 136 135  --   --  133* 132*  K 3.6 4.0  --   --  4.4  4.6  CL 98* 101  --   --  104 101  CO2 27 27  --   --  24 27  GLUCOSE 325* 287*  --   --  173* 261*  BUN 13 15  --   --  15 12  CREATININE 0.57 0.47 0.46 0.44 0.37* 0.53  CALCIUM 9.4 9.0  --   --  8.7* 8.7*   GFR: Estimated Creatinine Clearance: 62.8 mL/min (by C-G formula based on SCr of 0.53 mg/dL). Liver Function Tests:  Recent Labs Lab 06/01/16 1526 06/02/16 0537  AST 18 15  ALT 18 15  ALKPHOS 98 66  BILITOT 0.7 0.5  PROT 9.3* 7.2  ALBUMIN 3.2* 2.4*   No results for input(s): LIPASE, AMYLASE in the last 168 hours. No results for input(s): AMMONIA in the last 168 hours. Coagulation Profile: No results for input(s): INR, PROTIME in the last 168 hours. Cardiac Enzymes: No results for input(s): CKTOTAL, CKMB, CKMBINDEX, TROPONINI in the last 168 hours. BNP (last 3 results) No results for input(s): PROBNP in the last 8760 hours. HbA1C: No results for input(s): HGBA1C in the last 72 hours. CBG:  Recent Labs Lab 06/05/16 1136 06/05/16 1609 06/05/16 1746 06/05/16 2142 06/06/16 0758  GLUCAP 257* 92 115* 154* 229*   Lipid Profile: No results for input(s): CHOL, HDL, LDLCALC, TRIG, CHOLHDL, LDLDIRECT in the last 72 hours. Thyroid Function Tests: No results for input(s): TSH, T4TOTAL, FREET4, T3FREE, THYROIDAB in the last 72 hours. Anemia Panel:  Recent Labs  06/05/16 1239 06/06/16 0607  VITAMINB12  --  626  FOLATE 10.7  --   FERRITIN 196  --   TIBC 262  --   IRON 27*  --   RETICCTPCT 1.2  --    Urine analysis:    Component Value Date/Time    COLORURINE YELLOW 04/01/2016 1051   APPEARANCEUR HAZY (A) 04/01/2016 1051   LABSPEC 1.032 (H) 04/01/2016 1051   PHURINE 6.0 04/01/2016 1051   GLUCOSEU >=500 (A) 04/01/2016 1051   HGBUR SMALL (A) 04/01/2016 1051   BILIRUBINUR N 04/28/2016 1603   KETONESUR 20 (A) 04/01/2016 1051   PROTEINUR N 04/28/2016 1603   PROTEINUR 30 (A) 04/01/2016 1051   UROBILINOGEN 0.2 04/28/2016 1603   UROBILINOGEN 0.2 06/18/2013 0650   NITRITE p 04/28/2016 1603   NITRITE POSITIVE (A) 04/01/2016 1051   LEUKOCYTESUR Negative 04/28/2016 1603   Sepsis Labs: @LABRCNTIP (procalcitonin:4,lacticidven:4) ) Recent Results (from the past 240 hour(s))  Culture, blood (single)     Status: None   Collection Time: 06/01/16  3:27 PM  Result Value Ref Range Status   Specimen Description BLOOD RIGHT ANTECUBITAL  Final   Special Requests BOTTLES DRAWN AEROBIC AND ANAEROBIC 5ML  Final   Culture   Final    NO GROWTH 5 DAYS Performed at [facility], [place] [address], [place], [place] [address]    Report Status 06/06/2016 FINAL  Final  Body fluid culture     Status: None (Preliminary result)   Collection Time: 06/04/16 12:09 PM  Result Value Ref Range Status   Specimen Description FOOT LEFT  Final   Special Requests Normal  Final   Gram Stain   Final    MODERATE WBC PRESENT, PREDOMINANTLY PMN NO ORGANISMS SEEN    Culture   Final    NO GROWTH 1 DAY Performed at [facility], [place] [address] [place], [place] [address]    Report Status PENDING  Incomplete  MRSA PCR Screening  Status: None   Collection Time: 06/04/16  4:35 PM  Result Value Ref Range Status   MRSA by PCR NEGATIVE NEGATIVE Final    Comment:        The GeneXpert MRSA Assay (FDA approved for NASAL specimens only), is one component of a comprehensive MRSA colonization surveillance program. It is not intended to diagnose MRSA infection nor to guide or monitor treatment for MRSA infections.          Radiology Studies: No results  found.    Scheduled Meds: . cefTRIAXone (ROCEPHIN)  IV  2 g Intravenous Q24H   And  . metronidazole  500 mg Intravenous Q8H  . docusate sodium  100 mg Oral BID  . enoxaparin (LOVENOX) injection  40 mg Subcutaneous Q24H  . escitalopram  10 mg Oral Daily  . feeding supplement (GLUCERNA SHAKE)  237 mL Oral TID BM  . insulin aspart  0-15 Units Subcutaneous TID WC  . insulin aspart  0-5 Units Subcutaneous QHS  . insulin glargine  30 Units Subcutaneous QHS  . lisinopril  2.5 mg Oral Daily  . multivitamin with minerals  1 tablet Oral Daily  . vancomycin  1,000 mg Intravenous Q12H   Continuous Infusions: . sodium chloride 10 mL/hr at 06/05/16 2038     LOS: 5 days    Time spent in minutes: 12    [place], MD [facility] Pager: [email or web address] Password [email or web address] 06/06/2016, 4:10 PM

## 2016-06-06 NOTE — Progress Notes (Signed)
[id number] Gina [name],BSN,RN3,CCM:  Patient will need a wheelchair, 3 in 1, and wound vac on discharge.  Have contacted the [facility]. To see if they have a WC and 3in1 that she could borrow or have.  Will await this before contacting dme providers.

## 2016-06-07 DIAGNOSIS — L03032 Cellulitis of left toe: Secondary | ICD-10-CM

## 2016-06-07 DIAGNOSIS — E1151 Type 2 diabetes mellitus with diabetic peripheral angiopathy without gangrene: Secondary | ICD-10-CM

## 2016-06-07 DIAGNOSIS — E1165 Type 2 diabetes mellitus with hyperglycemia: Secondary | ICD-10-CM

## 2016-06-07 DIAGNOSIS — E1100 Type 2 diabetes mellitus with hyperosmolarity without nonketotic hyperglycemic-hyperosmolar coma (NKHHC): Secondary | ICD-10-CM

## 2016-06-07 DIAGNOSIS — D638 Anemia in other chronic diseases classified elsewhere: Secondary | ICD-10-CM

## 2016-06-07 DIAGNOSIS — R809 Proteinuria, unspecified: Secondary | ICD-10-CM

## 2016-06-07 LAB — GLUCOSE, CAPILLARY
GLUCOSE-CAPILLARY: 86 mg/dL (ref 65–99)
GLUCOSE-CAPILLARY: 221 mg/dL — AB (ref 65–99)
Glucose-Capillary: 193 mg/dL — ABNORMAL HIGH (ref 65–99)

## 2016-06-07 LAB — CREATININE, SERUM
Creatinine, Ser: 0.47 mg/dL (ref 0.44–1.00)
GFR calc non Af Amer: >60 mL/min (ref >60)

## 2016-06-07 MED ORDER — BISACODYL 10 MG RE SUPP: 10.0000 mg | Freq: Every day | RECTAL | 0 refills | Status: DC | PRN

## 2016-06-07 MED ORDER — INSULIN GLARGINE 100 UNITS/ML SOLOSTAR PEN: 30.0000 [IU] | PEN_INJECTOR | Freq: Every day | SUBCUTANEOUS | 11 refills | Status: DC

## 2016-06-07 MED ORDER — DOCUSATE SODIUM 100 MG PO CAPS: 100.0000 mg | ORAL_CAPSULE | Freq: Two times a day (BID) | ORAL | 0 refills | Status: DC

## 2016-06-07 MED ORDER — SENNOSIDES-DOCUSATE SODIUM 8.6-50 MG PO TABS: 2.0000 | ORAL_TABLET | Freq: Every day | ORAL | 0 refills | Status: DC

## 2016-06-07 MED ORDER — POLYETHYLENE GLYCOL 3350 17 G PO PACK: 17.0000 g | PACK | Freq: Every day | ORAL | 0 refills | Status: DC | PRN

## 2016-06-07 MED ORDER — LISINOPRIL 2.5 MG PO TABS: 2.5000 mg | ORAL_TABLET | Freq: Every day | ORAL | 2 refills | Status: DC

## 2016-06-07 MED ORDER — INSULIN GLARGINE 100 UNIT/ML SOLOSTAR PEN: 30.0000 [IU] | PEN_INJECTOR | Freq: Every day | SUBCUTANEOUS | 11 refills | Status: DC

## 2016-06-07 MED ORDER — INSULIN ASPART 100 UNIT/ML FLEXPEN: 2.0000 [IU] | PEN_INJECTOR | Freq: Three times a day (TID) | SUBCUTANEOUS | 11 refills | Status: DC

## 2016-06-07 MED ORDER — INSULIN ASPART 100 UNIT/ML ~~LOC~~ SOLN: 0.0000 [IU] | Freq: Three times a day (TID) | SUBCUTANEOUS | 11 refills | Status: DC

## 2016-06-07 MED ORDER — OXYCODONE HCL 5 MG PO TABS: 5.0000 mg | ORAL_TABLET | ORAL | 0 refills | Status: DC | PRN

## 2016-06-07 MED ORDER — INSULIN PEN NEEDLE 31G X 8 MM MISC: 1.0000 | Freq: Three times a day (TID) | 0 refills | Status: DC

## 2016-06-07 MED ORDER — INSULIN ASPART 100 UNIT/ML ~~LOC~~ SOLN
3.0000 [IU] | Freq: Three times a day (TID) | SUBCUTANEOUS | Status: DC
  Administered 2016-06-07 (×2): 3 [IU] via SUBCUTANEOUS

## 2016-06-07 MED ORDER — LANCETS 30G MISC: 6 refills | Status: DC

## 2016-06-07 MED ORDER — ACETAMINOPHEN 325 MG PO TABS: 650.0000 mg | ORAL_TABLET | Freq: Four times a day (QID) | ORAL | Status: DC | PRN

## 2016-06-07 MED FILL — NOVOLOG FLEXPEN SYRINGE: 100 | 33 days supply | Qty: 15 | Fill #0

## 2016-06-07 MED FILL — LISINOPRIL 2.5 MG TABLET: 2.5 | 30 days supply | Qty: 30 | Fill #0 | Status: TO

## 2016-06-07 MED FILL — LANTUS SOLOSTAR 100 UNITS/M: 100 | 30 days supply | Qty: 9 | Fill #0

## 2016-06-07 NOTE — Progress Notes (Signed)
Went over d/c instructions with patient's daughter.  She verbalized understanding.  Wound vac changed to patient's portable system.  Patient and family left hospital with all belongings.

## 2016-06-07 NOTE — Progress Notes (Addendum)
Date:  June 07, 2016 Chart reviewed for concurrent status and case management needs. Will continue to follow patient progress. Discharge Planning: following for needs/ RN,PT and Aide [facility] insurance/[facility] to provide [facility] wheelchair. Expected discharge date: [id number] Jaylani Mcguinn, BSN, [place], [place]

## 2016-06-07 NOTE — Progress Notes (Signed)
qPhysical Therapy Treatment Patient Details Name: Gina [name] MRN: [id number] DOB: 1960-05-22 Today's Date: 06/07/2016    History of Present Illness 56 y.o. female with a diagnosis of infection left foot s/p L transmetatarsal amputation 06/05/16 with hx of type 2 DM    PT Comments    Pt practiced transferring and propelling wheelchair (from PT dept) in hallway.  Recommend post op or offloading shoe prior to d/c so discussed with RN (plans to contact Dr. Jess Barters office).  Pt likely to d/c to daughter's home later today.   Follow Up Recommendations  Home health PT;Supervision for mobility/OOB (unable to d/c to SNF)     Equipment Recommendations  Wheelchair (measurements PT);Rolling walker with 5" wheels;Wheelchair cushion (measurements PT)    Recommendations for Other Services       Precautions / Restrictions Precautions Precautions: Fall Precaution Comments: NPWT Restrictions Weight Bearing Restrictions: Yes LLE Weight Bearing: Touchdown weight bearing Other Position/Activity Restrictions:  new order for TWB - would benefit from a post op shoe due to wound vac    Mobility  Bed Mobility Overal bed mobility: Modified Independent             General bed mobility comments: pt elevated HOB  Transfers Overall transfer level: Needs assistance Equipment used: None Transfers: Sit to/from [employer]     Squat pivot transfers: Min guard     General transfer comment: verbal cues for squat pivot transfer to wheel chair, pt unable to tolerate L LE dependent position so kept L LE propped up on bed while transferring, recommended close w/c to bed transfer once home  Ambulation/Gait                 Holiday representative Wheelchair mobility: Yes Wheelchair propulsion: Both lower extermities;Right lower extremity Wheelchair parts: Supervision/cueing Distance: 100 Wheelchair Assistance Details (indicate cue  type and reason): pt able to propel w/c with verbal and tactile cues, reviewed turning and set up to return transfer out of wheel chair; educated on elevated leg rest and brakes  Modified Rankin (Stroke Patients Only)       Balance                                            Cognition Arousal/Alertness: Awake/alert Behavior During Therapy: WFL for tasks assessed/performed Overall Cognitive Status: Within Functional Limits for tasks assessed                                        Exercises      General Comments        Pertinent Vitals/Pain Pain Assessment: Faces Faces Pain Scale: Hurts even more Pain Location: L foot with dependent position Pain Descriptors / Indicators: Grimacing;Guarding;Throbbing Pain Intervention(s): Repositioned;Monitored during session    Home Living                      Prior Function            PT Goals (current goals can now be found in the care plan section) Progress towards PT goals: Progressing toward goals    Frequency    Min 3X/week      PT Plan Current plan remains appropriate  Co-evaluation             End of Session   Activity Tolerance: Patient tolerated treatment well Patient left: in bed;with call bell/phone within reach;with family/visitor present   PT Visit Diagnosis: Other abnormalities of gait and mobility (R26.89);Pain Pain - Right/Left: Left Pain - part of body: Ankle and joints of foot     Time: 1344-1405 PT Time Calculation (min) (ACUTE ONLY): 21 min  Charges:  $Wheel Chair Management: 8-22 mins                    G Codes:       Carmelia Bake, PT, DPT 06/07/2016 Pager: [phone]   York Ram E 06/07/2016, 4:12 PM

## 2016-06-07 NOTE — Progress Notes (Signed)
Nutrition Follow-up  DOCUMENTATION CODES:   Severe malnutrition in context of chronic illness  INTERVENTION:   Glucerna Shake po TID, each supplement provides 220 kcal and 10 grams of protein  MVI  NUTRITION DIAGNOSIS:   Malnutrition related to chronic illness as evidenced by severe depletion of body fat, severe depletion of muscle mass, 12 percent weight loss in two months.  GOAL:   Patient will meet greater than or equal to 90% of their needs  MONITOR:   PO intake, Supplement acceptance, Labs, Weight trends, Skin   ASSESSMENT:   56 y.o. female with medical history significant of poorly controlled diabetes mellitus with most recent hemoglobin A1c of greater than 15, hypertension, presents to the emergency room with chief complaint of left foot swelling and redness, as well as pain with ambulation.  Patient has a history of type 2 diabetes mellitus, however has been homeless up until about couple of months ago and has been without her medications for quite some time.  She has recently [facility] care with [facility] and has been taking her insulin since.  Over the last couple weeks, she has been complaining of left foot swelling which has been progressive.  She was in the emergency room about 8 days ago, she was evaluated and placed on oral antibiotics and sent home.  Despite taking oral antibiotics, her swelling has progressed, has gotten worse, and her pain has increased into her left foot.    Pt continues to eat 100% of carbohydrate controlled diet. Pt now s/p left transmetatarsal amputation on 3/26. Pt with wound vac in place and plans to d/c home with Prevana wound VAC. Pt drinking Glucerna. No new weight since admit. Pt received diabetes education on 3/23; pt would benefit from additional education prior to discharge.   Medications reviewed and include: ceftriaxone, metronidazole, colace, lovenox, insulin, MVI vancomycin, hydrocodone,  hydromorphone   Labs reviewed: wbc- 13.6(H), Hgb 9.3(L), Hct 28.8(L) cbgs- 173, 261 x 48 hrs AIC 14.3(H) 3/22  Diet Order:  Diet Carb Modified Fluid consistency: Thin; Room service appropriate? Yes Diet Carb Modified  Skin:  Wound (see comment) (incision left foot)  Last BM:  3/27  Height:   Ht Readings from Last 1 Encounters:  06/01/16 5\' 2"  (1.575 m)    Weight:   Wt Readings from Last 1 Encounters:  06/01/16 128 lb (58.1 kg)    Ideal Body Weight:  50 kg  BMI:  Body mass index is 23.41 kg/m.  Estimated Nutritional Needs:   Kcal:  1750-2050kcal/day   Protein:  87-99g/day   Fluid:  >1.7L/day   EDUCATION NEEDS:   Education needs addressed  Koleen Distance, RD, LDN Pager #- [phone]

## 2016-06-07 NOTE — Discharge Summary (Signed)
Physician Discharge Summary  Loreal Schuessler KAJ:[id number] DOB: 04/03/1960 DOA: 06/01/2016  PCP: Fredia Beets, FNP  Admit date: 06/01/2016 Discharge date: 06/07/2016  Admitted From: home  Disposition:  home   Recommendations for Outpatient Follow-up:  1. Check A1c in 1 month- ensure compliance with Insulin and diet     Home Health:  RN, PT, Aid    Discharge Condition:  stable   CODE STATUS:  Full code   Diet recommendation:  Carb modified Consultations:  ortho    Discharge Diagnoses:  Principal Problem:  Cellulitis and abscess Osteomyelitis due to type 2 diabetes mellitus ([place]) Active Problems:   Septic arthritis of interphalangeal joint of toe of left foot (HCC)   DM (diabetes mellitus), type 2, uncontrolled, periph vascular complic (HCC)   Protein-calorie malnutrition, severe  Anemia of chronic disease   Subjective: No complaints today.   Brief Summary: Gina [name] a 56 y.o.femalewith medical history significant of poorly controlled diabetes mellitus with most recent hemoglobin A1c of greater than 15, hypertension, presents to the emergency room with chief complaint of left foot swelling and redness, as well as pain with ambulation. Patient has a history of type 2 diabetes mellitus, however has been homeless up until about couple of months ago and has been without her medications for quite some time. Was only taking Metformin and Lexapro at home and no insulin.  Over the last couple weeks, she has been complaining of left foot swelling which has been progressive after stepping on a tack. She was in the emergency room about 8 days ago, she was evaluated and placed on 2 different oral antibiotics (bactrim and keflex per records) and sent home. Despite taking oral antibiotics, her swelling has progressed and her pain has increased into her left foot. She denies any systemic symptoms, she has no fever or chills, she has no abdominal pain, no nausea, vomiting or  diarrhea. She denies any chest pain or shortness of breath.   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  - cont Vanc, Rocephin and Flagyl x 48 hrs Nonweightbearing on left foot- will go home with "Prevana wound VAC"- will go home and not SNF due to cost NOTE: during surgery, vessels were noted to be heavily calcified   Active Problems:   Type 2 diabetes mellitus, uncontrolled - severely uncontrolled with A1c > 15 (note at it was the same in 2015 when she was supposedly on Insulin) - initially stated that has only been taking Metformin and not Insulin due to cost - today states that she is scared of giving herself insulin and that is why she was not taking it - sugars improved on 30 U Lantus- cont SSI and 3 U Novolog (added today) with meals - Insulin pens, glucometer and test strips prescribed- can get prescriptions filled at [facility] - discussed need for control in detail with patient and daughter- discussed need for patient to give herself the insulin (she wants  her daughter to do it for her), prescribed pens to ease her fear of administration - discussed complications including the current infection and need for amputation as a direct result of uncontrolled SM  - Lisinopril 2.5 mg added for renal protection (BP currently not elevated)    Anemia - anemia panel consistent with AOCD- retic count not elevated  Depression -  cont Lexapro   Severe Protein calorie Malnutrition - nutritionist added Glucerna while in hospital- she is not underweight- can continue a healthy diet at home Body mass index is 23.41 kg/m.      Discharge Instructions  Discharge Instructions    Diet Carb Modified    Complete by:  As directed    Discharge instructions    Complete by:  As directed    You must keep a paper record of your blood sugars and how much Novolog you gave yourself at every meal.  Skip Novolog if you are not going to eat a meal but DO NOT SKIP LANTUS EVEN IF YOU ARE NOT EATING.  Call your doctor if your sugars are running more than 300 for over 24 hrs.   Increase activity slowly    Complete by:  As directed    Negative Pressure Wound Therapy - Incisional    Complete by:  As directed    Use the Prevena wound VAC for 7 days. Discontinue the pump if it alarms and stops working. Discontinue the dressing at that time. Apply dry dressing.   Touch down weight bearing    Complete by:  As directed    Laterality:  left   Extremity:  Lower     Allergies as of 06/07/2016      Reactions   Metformin And Related Other (See Comments)   Upset stomach      Medication List    STOP taking these medications   Syringe/Needle (Disp) 29G X 1/2" 1 ML Misc Commonly known as:  PRODIGY SAFETY SYRINGES     TAKE these medications   acetaminophen 325 MG tablet Commonly known as:  TYLENOL Take 2 tablets (650 mg total) by mouth every 6 (six) hours as needed for mild pain (or Fever >/= 101).   bisacodyl 10 MG suppository Commonly known as:  DULCOLAX Place 1 suppository (10 mg total) rectally daily as needed for moderate constipation.   docusate sodium 100 MG capsule Commonly known as:  COLACE Take 1 capsule (100 mg total) by mouth 2 (two) times daily.   escitalopram 10 MG tablet Commonly known as:  LEXAPRO Take 1 tablet (10 mg total) by mouth daily. After 1 week may increase take 2 tablets (20 mg total) by mouth  daily. What changed:  additional instructions   glucose blood test strip Use as instructed   glucose monitoring kit monitoring kit Use glucometer to check sugar three times a day   insulin aspart 100 UNIT/ML FlexPen Commonly known as:  NOVOLOG FLEXPEN Inject 2-15 Units into the skin 3 (three) times daily with meals. Take 3 U Novolog with each meal and add on more units based on you blood sugars   CBG 70 - 120: 0 units  CBG 121 - 150: 2 units  CBG 151 - 200: 3 units  CBG 201 - 250: 5 units  CBG 251 - 300: 8 units  CBG 301 - 350: 11 units  CBG 351 - 400: 15 units   insulin aspart 100 UNIT/ML injection Commonly known as:  novoLOG Inject 0-15 Units into the skin 3 (three)  times daily with meals.   Insulin Glargine 100 UNIT/ML Solostar Pen Commonly known as:  LANTUS Inject 30 Units into the skin daily at 10 pm.   Insulin Pen Needle 31G X 8 MM Misc 1 each by Does not apply route 4 (four) times daily - after meals and at bedtime.   Lancets 30G Misc Use to check blood sugar three times a day   lisinopril 2.5 MG tablet Commonly known as:  PRINIVIL,ZESTRIL Take 1 tablet (2.5 mg total) by mouth daily.   metFORMIN 500 MG 24 hr tablet Commonly known as:  GLUCOPHAGE-XR Take 2 tablets (1,000 mg total) by mouth 2 (two) times daily.   oxyCODONE 5 MG immediate release tablet Commonly known as:  Oxy IR/ROXICODONE Take 1-2 tablets (5-10 mg total) by mouth every 4 (four) hours as needed for breakthrough pain.   polyethylene glycol packet Commonly known as:  MIRALAX Take 17 g by mouth daily as needed for mild constipation.   senna-docusate 8.6-50 MG tablet Commonly known as:  Senokot-S Take 2 tablets by mouth daily.            Durable Medical Equipment        Start     Ordered   06/07/16 1157  For home use only DME wheelchair cushion (seat and back)  Once     06/07/16 1159   06/07/16 1150  For home use only DME standard manual wheelchair with seat cushion  Once    Comments:   Discharge Activity level: Bedrest Patient suffers from diabetes and removal of toes from the rt foot which impairs their ability to perform daily activities like bathing in the home.  A walker will not resolve  issue with performing activities of daily living. A wheelchair will allow patient to safely perform daily activities. Patient is not able to propel themselves in the home using a standard weight wheelchair due to general weakness. Patient can self propel in the lightweight wheelchair.  Accessories: elevating leg rests (ELRs), wheel locks, extensions and anti-tippers. Patient suffers from diabetes and great toe amputationwhich impairs their ability to perform daily activities like dressing in the home.  A walker will not resolve  issue with performing activities of daily living. A wheelchair will allow patient to safely perform daily activities. Patient can safely propel the wheelchair in the home or has a caregiver who can provide assistance.  Accessories: elevating leg rests (ELRs), wheel locks, extensions and anti-tippers.   06/07/16 1159     Follow-up Information    [name], MD Follow up in 1 week(s).   Specialty:  Orthopedic Surgery Contact information: [place] [address] [place] Follow up on 06/16/2016.   Why:  be at the clinic 15 mins before appointment/appointment is 04/06/2-18 at 9 am.  Call [phone] if unable to keep this appointment. Contact information: [place] [address] [phone]         Allergies  Allergen Reactions  . Metformin And Related Other (See Comments)    Upset stomach     Procedures/Studies: 3/26- LEFT FOOT TRANSMETATARSAL AMPUTATION  Mr Foot Left Wo Contrast  Result Date: 06/02/2016 CLINICAL DATA:  Foot pain and swelling.  Abnormal x-rays. EXAM: MRI OF THE LEFT FOOT WITHOUT CONTRAST TECHNIQUE: Multiplanar,  multisequence MR imaging of the left foot was performed. No intravenous contrast was administered. COMPARISON:  Radiographs 06/01/2016 FINDINGS: There is diffuse signal  abnormality in the second metatarsal consistent with osteomyelitis. Similar changes in the proximal phalanx of the second toe consistent with osteomyelitis and intervening septic arthritis at the second MTP joint. There are also large draining soft tissue abscesses along the dorsal and plantar aspects of the foot. The plantar abscess extends to the skin where there is a large skin blister. It also extends medially and measures a total of 4.5 x 1.9 cm. The dorsal abscess measures 2.1 x 1.7 cm. There is also septic tenosynovitis involving the flexor tendon. Marrow edema in the third metatarsal head could also reflect osteomyelitis. There is advanced degenerative changes at the first metatarsal phalangeal joint but no definite findings for osteomyelitis/septic arthritis. The distal phalanges and cuneiforms appear normal. Diffuse cellulitis and myofasciitis. IMPRESSION: Septic arthritis involving the second MTP joint and associated osteomyelitis in the second metatarsal and second proximal phalanx. Cellulitis, myofasciitis and significant dorsal and plantar soft tissue abscesses as discussed above. Electronically Signed   By: Marijo Sanes M.D.   On: 06/02/2016 08:05   Dg Foot Complete Left  Result Date: 06/01/2016 CLINICAL DATA:  Pain and swelling. History of penetrating wound in the plantar aspect of the foot. EXAM: LEFT FOOT - COMPLETE 3+ VIEW COMPARISON:  05/23/2016 FINDINGS: Advanced degenerative changes involving the first metatarsal phalangeal joint with mild hallux valgus deformity. There are progressive erosive changes. Erosions are also noted involving the second, third and fourth MTP joints. Findings could be due to erosive arthropathy. Gout would be a consideration. Since the prior x-rays of 05/23/2016 there has been interval development  of joint space narrowing involving the second MTP joint along with possible progressive destructive changes involving the proximal phalanx and possible pathologic fracture. Plain film findings are concerning for second MTP joint septic arthritis and osteomyelitis. MRI without with contrast is suggested for further evaluation. No significant midfoot or visualized hindfoot abnormalities. IMPRESSION: 1. Plain film findings worrisome for septic arthritis involving the second MTP joint pain and osteomyelitis involving the second proximal phalanx. Recommend MRI left foot without and with contrast for further evaluation. 2. 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  Gout would be a consideration. Electronically Signed   By: Marijo Sanes M.D.   On: 06/01/2016 15:20   Dg Foot Complete Left  Result Date: 05/23/2016 CLINICAL DATA:  Stepped on hand several days ago with swelling and redness, history of diabetes EXAM: LEFT FOOT - COMPLETE 3+ VIEW COMPARISON:  Left foot films of 11/13/2005 FINDINGS: No opaque foreign body is seen. No focal demineralization or erosion is evident to indicate active osteomyelitis. There has been progression of degenerative joint disease involving the left first MTP joint with loss of joint space, sclerosis, spurring, and subchondral cyst formation. No erosion is seen. There is a small plantar calcaneal degenerative spur present. IMPRESSION: 1. No radiographic evidence of osteomyelitis is seen. 2. 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 Electronically Signed   By: Ivar Drape M.D.   On: 05/23/2016 16:50       Discharge Exam: Vitals:   06/06/16 2110 06/07/16 0449  BP: 99/68 105/65  Pulse: 95 84  Resp: 18 18  Temp: 98.3 F (36.8 C) 98.5 F (36.9 C)   Vitals:   06/06/16 0615 06/06/16 1445 06/06/16 2110 06/07/16 0449  BP: 132/70 (!) 100/59 99/68 105/65  Pulse: 77 92 95 84  Resp: 18 18 18 18   Temp: 97.6 F (36.4 C) 98.2 F (36.8 C) 98.3 F (36.8 C)  98.5 F (36.9 C)  TempSrc:  Oral Oral Oral Oral  SpO2: 100% 99% 100% 97%  Weight:      Height:        General: Pt is alert, awake, not in acute distress Cardiovascular: RRR, S1/S2 +, no rubs, no gallops Respiratory: CTA bilaterally, no wheezing, no rhonchi Abdominal: Soft, NT, ND, bowel sounds + Extremities: no edema, no cyanosis    The results of significant diagnostics from this hospitalization (including imaging, microbiology, ancillary and laboratory) are listed below for reference.     Microbiology: Recent Results (from the past 240 hour(s))  Culture, blood (single)     Status: None   Collection Time: 06/01/16  3:27 PM  Result Value Ref Range Status   Specimen Description BLOOD RIGHT ANTECUBITAL  Final   Special Requests BOTTLES DRAWN AEROBIC AND ANAEROBIC 5ML  Final   Culture   Final    NO GROWTH 5 DAYS Performed at [facility], [address], [place], [place] [address]    Report Status 06/06/2016 FINAL  Final  Body fluid culture     Status: None (Preliminary result)   Collection Time: 06/04/16 12:09 PM  Result Value Ref Range Status   Specimen Description FOOT LEFT  Final   Special Requests Normal  Final   Gram Stain   Final    MODERATE WBC PRESENT, PREDOMINANTLY PMN NO ORGANISMS SEEN    Culture   Final    NO GROWTH 3 DAYS Performed at [facility], [place] [address], [place], [place] [address]    Report Status PENDING  Incomplete  MRSA PCR Screening     Status: None   Collection Time: 06/04/16  4:35 PM  Result Value Ref Range Status   MRSA by PCR NEGATIVE NEGATIVE Final    Comment:        The GeneXpert MRSA Assay (FDA approved for NASAL specimens only), is one component of a comprehensive MRSA colonization surveillance program. It is not intended to diagnose MRSA infection nor to guide or monitor treatment for MRSA infections.      Labs: BNP (last 3 results)  Recent Labs  04/28/16 1640  BNP 97.6   Basic Metabolic Panel:  Recent  Labs Lab 06/01/16 1526 06/02/16 0537 06/03/16 0537 06/04/16 0531 06/05/16 0532 06/06/16 0607 06/07/16 0622  NA 136 135  --   --  133* 132*  --   K 3.6 4.0  --   --  4.4 4.6  --   CL 98* 101  --   --  104 101  --   CO2 27 27  --   --  24 27  --   GLUCOSE 325* 287*  --   --  173* 261*  --   BUN 13 15  --   --  15 12  --   CREATININE 0.57 0.47 0.46 0.44 0.37* 0.53 0.47  CALCIUM 9.4 9.0  --   --  8.7* 8.7*  --    Liver Function Tests:  Recent Labs Lab 06/01/16 1526 06/02/16 0537  AST 18 15  ALT 18 15  ALKPHOS 98 66  BILITOT 0.7 0.5  PROT 9.3* 7.2  ALBUMIN 3.2* 2.4*   No results for input(s): LIPASE, AMYLASE in the last 168 hours. No results for input(s): AMMONIA in the last 168 hours. CBC:  Recent Labs Lab 06/01/16 1526 06/02/16 0537 06/03/16 0537 06/05/16 0532 06/06/16 0607  WBC 13.4* 13.0* 12.6* 13.3* 13.6*  NEUTROABS 10.7*  --   --   --   --  HGB 12.3 10.7* 9.9* 9.4* 9.3*  HCT 37.2 32.2* 29.8* 28.1* 28.8*  MCV 88.8 89.2 88.4 89.8 91.7  PLT 423* 373 363 372 378   Cardiac Enzymes: No results for input(s): CKTOTAL, CKMB, CKMBINDEX, TROPONINI in the last 168 hours. BNP: Invalid input(s): POCBNP CBG:  Recent Labs Lab 06/06/16 0758 06/06/16 1633 06/06/16 2051 06/07/16 0738 06/07/16 1132  GLUCAP 229* 321* 238* 86 193*   D-Dimer No results for input(s): DDIMER in the last 72 hours. Hgb A1c No results for input(s): HGBA1C in the last 72 hours. Lipid Profile No results for input(s): CHOL, HDL, LDLCALC, TRIG, CHOLHDL, LDLDIRECT in the last 72 hours. Thyroid function studies No results for input(s): TSH, T4TOTAL, T3FREE, THYROIDAB in the last 72 hours.  Invalid input(s): FREET3 Anemia work up  Recent Labs  06/05/16 1239 06/06/16 0607  VITAMINB12  --  626  FOLATE 10.7  --   FERRITIN 196  --   TIBC 262  --   IRON 27*  --   RETICCTPCT 1.2  --    Urinalysis    Component Value Date/Time   COLORURINE YELLOW 04/01/2016 1051   APPEARANCEUR HAZY  (A) 04/01/2016 1051   LABSPEC 1.032 (H) 04/01/2016 1051   PHURINE 6.0 04/01/2016 1051   GLUCOSEU >=500 (A) 04/01/2016 1051   HGBUR SMALL (A) 04/01/2016 1051   BILIRUBINUR N 04/28/2016 1603   KETONESUR 20 (A) 04/01/2016 1051   PROTEINUR N 04/28/2016 1603   PROTEINUR 30 (A) 04/01/2016 1051   UROBILINOGEN 0.2 04/28/2016 1603   UROBILINOGEN 0.2 06/18/2013 0650   NITRITE p 04/28/2016 1603   NITRITE POSITIVE (A) 04/01/2016 1051   LEUKOCYTESUR Negative 04/28/2016 1603   Sepsis Labs Invalid input(s): PROCALCITONIN,  WBC,  LACTICIDVEN Microbiology Recent Results (from the past 240 hour(s))  Culture, blood (single)     Status: None   Collection Time: 06/01/16  3:27 PM  Result Value Ref Range Status   Specimen Description BLOOD RIGHT ANTECUBITAL  Final   Special Requests BOTTLES DRAWN AEROBIC AND ANAEROBIC 5ML  Final   Culture   Final    NO GROWTH 5 DAYS Performed at [facility], [place] [address], [place], [place] [address]    Report Status 06/06/2016 FINAL  Final  Body fluid culture     Status: None (Preliminary result)   Collection Time: 06/04/16 12:09 PM  Result Value Ref Range Status   Specimen Description FOOT LEFT  Final   Special Requests Normal  Final   Gram Stain   Final    MODERATE WBC PRESENT, PREDOMINANTLY PMN NO ORGANISMS SEEN    Culture   Final    NO GROWTH 3 DAYS Performed at [facility], [place] [address], [place], [place] [address]    Report Status PENDING  Incomplete  MRSA PCR Screening     Status: None   Collection Time: 06/04/16  4:35 PM  Result Value Ref Range Status   MRSA by PCR NEGATIVE NEGATIVE Final    Comment:        The GeneXpert MRSA Assay (FDA approved for NASAL specimens only), is one component of a comprehensive MRSA colonization surveillance program. It is not intended to diagnose MRSA infection nor to guide or monitor treatment for MRSA infections.      Time coordinating discharge: Over 30  minutes  SIGNED:   [name], MD  [facility] 06/07/2016, 2:31 PM Pager   If 7PM-7AM, please contact night-coverage [email or web address] Password [email or web address]

## 2016-06-07 NOTE — Progress Notes (Signed)
CSW met with patient and her sister at bedside to check in with patient. Patient reports she is feeling okay. Patient daughter has started Medicaid application process and be able to use  more [facility] services. Patient sister reports she will help patient daughter with care as well. She is concerned about patient getting an infection if she does not receive the proper care. Patient expressed gratitude for care management services help. CSW provided emotional support.  Patient is to discharge home today.   Kathrin Greathouse, Latanya Presser, MSW Clinical Social Worker 5E and Psychiatric Service Line [phone] 06/07/2016  1:47 PM

## 2016-06-07 NOTE — Progress Notes (Signed)
Inpatient Diabetes Program Recommendations  AACE/ADA: New Consensus Statement on Inpatient Glycemic Control (2015)  Target Ranges:  Prepandial:   less than 140 mg/dL      Peak postprandial:   less than 180 mg/dL (1-2 hours)      Critically ill patients:  140 - 180 mg/dL   Lab Results  Component Value Date   GLUCAP 193 (H) 06/07/2016   HGBA1C 14.3 (H) 06/01/2016    Review of Glycemic Control  Pt to be discharged on Lantus 30 units QHS and Novolog 2-15 units tidwc.  Educated patient's daughter on insulin pen use at home.Reviewed all steps if insulin pen including attachment of needle, 2-unit air shot, dialing up dose, giving injection, removing needle, disposal of sharps, storage of unused insulin, disposal of insulin etc. Patient able to provide successful return demonstration. Also reviewed troubleshooting with insulin pen. MD to give patient Rxs for insulin pens and insulin pen needles.  Pt to pick up insulin pens and needles from [place] with [facility] letter per Case Manager.  Discussed above with RN.  Thank you. Lorenda Peck, RD, LDN, CDE Inpatient Diabetes Coordinator [phone]

## 2016-06-08 LAB — BODY FLUID CULTURE
Culture: NO GROWTH
SPECIAL REQUESTS: NORMAL

## 2016-06-13 ENCOUNTER — Other Ambulatory Visit: Payer: Self-pay | Admitting: Family Medicine

## 2016-06-13 ENCOUNTER — Telehealth: Payer: Self-pay | Admitting: Family Medicine

## 2016-06-13 NOTE — Telephone Encounter (Signed)
CMA call to inform patient about that she has refills available at the pharmacy   Patient Verify DOB  Patient was aware and understood

## 2016-06-13 NOTE — Telephone Encounter (Signed)
Olivia Mackie from [facility] called office to inform PCP that patient is missing (lost) Lexapro and Metformin. Pt has an appointment this Friday 4/6.  Thank you.

## 2016-06-13 NOTE — Telephone Encounter (Signed)
Patient already has refills available for both medications. Please notify pharmacy of situation and ask if she can get refills sometime today.  Please let patient know in the future to request refills from the pharmacy.

## 2016-06-15 ENCOUNTER — Ambulatory Visit (INDEPENDENT_AMBULATORY_CARE_PROVIDER_SITE_OTHER): Payer: Self-pay | Admitting: Orthopedic Surgery

## 2016-06-15 DIAGNOSIS — Z89432 Acquired absence of left foot: Secondary | ICD-10-CM

## 2016-06-15 NOTE — [place] Notes (Signed)
Office Visit Note   Patient: Gina [name]           Date of Birth: October 30, 1960           MRN: [id number] Visit Date: 06/15/2016              Requested by: Alfonse Spruce, [place], [place] [address] PCP: Fredia Beets, FNP  No chief complaint on file.     HPI: The patient is a 56 year old woman who presents today one week status post transmetatarsal amputation left foot. The wound VAC remains in place however it is not pumping. The patient states that it did come off once and broke the seal. Her daughter reapply the [facility] dressing with tape that they had at home. There is a large blood clot under the dressing.  Assessment & Plan: Visit Diagnoses:  1. S/P transmetatarsal amputation of foot, left ([place])     Plan: Cleanse the incision daily with dial soap. Apply dry dressing and Ace wrap. Importance of elevation was emphasized. Nonweightbearing.  Follow-Up Instructions: Return in about 2 weeks (around 06/29/2016).   Ortho Exam  Patient is alert, oriented, no adenopathy, well-dressed, normal affect, normal respiratory effort. Incision is approximated with sutures. There is large blood clot that it was removed today. There is no gaping. Does have moderate swelling over the foot. There is no erythema does have maceration. No sign of infection.  Imaging: No results found.  Labs: Lab Results  Component Value Date   HGBA1C 14.3 (H) 06/01/2016   HGBA1C >15 04/28/2016   HGBA1C 14.9 02/17/2014   ESRSEDRATE 114 (H) 06/01/2016   ESRSEDRATE 87 (H) 05/23/2016   CRP 5.3 (H) 06/01/2016   CRP 5.9 (H) 05/23/2016   LABURIC 3.8 06/04/2016   LABURIC 4.1 06/01/2016   REPTSTATUS 06/08/2016 FINAL 06/04/2016   GRAMSTAIN  06/04/2016    MODERATE WBC PRESENT, PREDOMINANTLY PMN NO ORGANISMS SEEN    CULT  06/04/2016    No growth aerobically or anaerobically. Performed at [facility], [place] [address] [place], [place] [address]    [place] (A)  04/01/2016    Orders:  No orders of the defined types were placed in this encounter.  No orders of the defined types were placed in this encounter.    Procedures: No procedures performed  Clinical Data: No additional findings.  ROS:  All other systems negative, except as noted in the HPI. Review of Systems  Constitutional: Negative for chills and fever.    Objective: Vital Signs: There were no vitals taken for this visit.  Specialty Comments:  No specialty comments available.  PMFS History: Patient Active Problem List   Diagnosis Date Noted  . S/P transmetatarsal amputation of foot, left ([place]) 06/15/2016  . DM (diabetes mellitus), type 2, uncontrolled, periph vascular complic ([place]) 50/11/3816  . Anemia of chronic disease 06/07/2016  . Cellulitis of left foot   . Protein-calorie malnutrition, severe 06/03/2016  . Osteomyelitis due to type 2 diabetes mellitus ([place]) 06/02/2016  . Septic arthritis of interphalangeal joint of toe of left foot ([place]) 06/02/2016  . Colon cancer screening 02/17/2014  . Breast cancer screening 02/17/2014  . Diabetes ([place]) 07/28/2013  . Cholelithiases 07/28/2013  . DKA (diabetic ketoacidoses) ([place]) 06/18/2013  . HTN (hypertension) 06/18/2013  . Cellulitis of female genitalia 08/03/2012  . Hyperglycemia 07/31/2012  . Candidal skin infection 07/31/2012  . Cellulitis and abscess 07/31/2012   Past Medical History:  Diagnosis Date  . Diabetes mellitus   .  Hypertension     Family History  Problem Relation Age of Onset  . Diabetes type II Father   . CAD Father   . Prostate cancer Father   . Diabetes Mellitus II Brother     Past Surgical History:  Procedure Laterality Date  . AMPUTATION Left 06/05/2016   Procedure: LEFT FOOT TRANSMETATARSAL AMPUTATION;  Surgeon: [name], MD;  Location: [facility];  Service: Orthopedics;  Laterality: Left;  . TUBAL LIGATION     Social History   Occupational History  . Not on file.   Social History  Main Topics  . Smoking status: Never Smoker  . Smokeless tobacco: Never Used  . Alcohol use No     Comment: occasionally  . Drug use: No  . Sexual activity: Not on file

## 2016-06-16 ENCOUNTER — Inpatient Hospital Stay: Payer: Self-pay | Admitting: Family Medicine

## 2016-06-23 ENCOUNTER — Other Ambulatory Visit: Payer: Self-pay | Admitting: Family Medicine

## 2016-06-23 ENCOUNTER — Ambulatory Visit: Payer: Self-pay | Attending: Family Medicine | Admitting: Family Medicine

## 2016-06-23 ENCOUNTER — Encounter: Payer: Self-pay | Admitting: Family Medicine

## 2016-06-23 ENCOUNTER — Telehealth: Payer: Self-pay

## 2016-06-23 ENCOUNTER — Ambulatory Visit: Payer: Self-pay

## 2016-06-23 VITALS — BP 163/77 | HR 77 | Temp 98.6°F | Resp 18 | Ht 62.0 in | Wt 128.0 lb

## 2016-06-23 DIAGNOSIS — K029 Dental caries, unspecified: Secondary | ICD-10-CM | POA: Insufficient documentation

## 2016-06-23 DIAGNOSIS — I10 Essential (primary) hypertension: Secondary | ICD-10-CM | POA: Insufficient documentation

## 2016-06-23 DIAGNOSIS — M7989 Other specified soft tissue disorders: Secondary | ICD-10-CM

## 2016-06-23 DIAGNOSIS — E1151 Type 2 diabetes mellitus with diabetic peripheral angiopathy without gangrene: Secondary | ICD-10-CM

## 2016-06-23 DIAGNOSIS — E1165 Type 2 diabetes mellitus with hyperglycemia: Secondary | ICD-10-CM | POA: Insufficient documentation

## 2016-06-23 DIAGNOSIS — R159 Full incontinence of feces: Secondary | ICD-10-CM | POA: Insufficient documentation

## 2016-06-23 DIAGNOSIS — Z79899 Other long term (current) drug therapy: Secondary | ICD-10-CM | POA: Insufficient documentation

## 2016-06-23 DIAGNOSIS — Z89432 Acquired absence of left foot: Secondary | ICD-10-CM | POA: Insufficient documentation

## 2016-06-23 DIAGNOSIS — Z9119 Patient's noncompliance with other medical treatment and regimen: Secondary | ICD-10-CM | POA: Insufficient documentation

## 2016-06-23 DIAGNOSIS — Z794 Long term (current) use of insulin: Secondary | ICD-10-CM | POA: Insufficient documentation

## 2016-06-23 DIAGNOSIS — IMO0002 Reserved for concepts with insufficient information to code with codable children: Secondary | ICD-10-CM

## 2016-06-23 DIAGNOSIS — R809 Proteinuria, unspecified: Secondary | ICD-10-CM

## 2016-06-23 DIAGNOSIS — R32 Unspecified urinary incontinence: Secondary | ICD-10-CM

## 2016-06-23 DIAGNOSIS — F329 Major depressive disorder, single episode, unspecified: Secondary | ICD-10-CM | POA: Insufficient documentation

## 2016-06-23 DIAGNOSIS — F418 Other specified anxiety disorders: Secondary | ICD-10-CM | POA: Insufficient documentation

## 2016-06-23 LAB — GLUCOSE, POCT (MANUAL RESULT ENTRY): POC GLUCOSE: 126 mg/dL — AB (ref 70–99)

## 2016-06-23 MED ORDER — DEPEND UNDERWEAR SM/MED MISC: 1.0000 | Status: DC | PRN

## 2016-06-23 MED ORDER — AMLODIPINE BESYLATE 10 MG PO TABS: 10.0000 mg | ORAL_TABLET | Freq: Every day | ORAL | 0 refills | Status: DC

## 2016-06-23 MED ORDER — TRUEPLUS LANCETS 28G MISC: 1.0000 | Freq: Once | 12 refills | Status: AC

## 2016-06-23 MED ORDER — GLUCOSE BLOOD VI STRP: ORAL_STRIP | 12 refills | Status: DC

## 2016-06-23 MED ORDER — TRUE METRIX METER W/DEVICE KIT: 1.0000 | PACK | Freq: Once | 0 refills | Status: AC

## 2016-06-23 MED ORDER — INSULIN GLARGINE 100 UNIT/ML SOLOSTAR PEN: 28.0000 [IU] | PEN_INJECTOR | Freq: Every day | SUBCUTANEOUS | 11 refills | Status: DC

## 2016-06-23 MED ORDER — INSULIN ASPART 100 UNIT/ML FLEXPEN: 2.0000 [IU] | PEN_INJECTOR | Freq: Three times a day (TID) | SUBCUTANEOUS | 11 refills | Status: DC

## 2016-06-23 MED ORDER — INSULIN PEN NEEDLE 31G X 8 MM MISC: 1.0000 | Freq: Three times a day (TID) | 0 refills | Status: DC

## 2016-06-23 MED ORDER — ESCITALOPRAM OXALATE 10 MG PO TABS: 10.0000 mg | ORAL_TABLET | Freq: Every day | ORAL | 0 refills | Status: DC

## 2016-06-23 MED FILL — ?ESCITALOPRAM 10 MG TABLET: 10 | 15 days supply | Qty: 30 | Fill #1

## 2016-06-23 MED FILL — METFORMIN HCL ER 500 MG TAB: 500 | 30 days supply | Qty: 120 | Fill #1

## 2016-06-23 MED FILL — TRUEplus LANCETS 28G MISC: 30 days supply | Qty: 100 | Fill #0

## 2016-06-23 MED FILL — !LANTUS SOLOSTAR 100UNITS/M: 100 | 32 days supply | Qty: 9 | Fill #0

## 2016-06-23 MED FILL — AMLODIPINE BESYLATE 10 MG T: 10 | 90 days supply | Qty: 90 | Fill #0

## 2016-06-23 MED FILL — !TRUE METRIX BLOOD GLUCOSE: 1 days supply | Qty: 1 | Fill #0

## 2016-06-23 MED FILL — TRUEPLUS PEN NDL 31GX5/16": 31 GX5/16" | 25 days supply | Qty: 100 | Fill #0

## 2016-06-23 MED FILL — TRUEPLUS PEN NDL 31GX5/16: 31 GX5/16" | 25 days supply | Qty: 100 | Fill #0

## 2016-06-23 MED FILL — TRUE METRIX TEST STRIP: 30 days supply | Qty: 100 | Fill #0

## 2016-06-23 NOTE — Telephone Encounter (Signed)
Referral for home health care received from [facility], [place].  The patient had been referred for home health RN when she was discharged from the hospital. This CM spoke to [facility] who stated that the patient was seen by the home health nurse yesterday and there is one more visit planned for 06/28/16. Webb Silversmith explained that she would not be eligible for home PT because she does not have any insurance.  Update provided to M. Braulio Conte, FNP.

## 2016-06-23 NOTE — [place] Notes (Signed)
Subjective:  Patient ID: Gina [name], female    DOB: Mar 25, 1960  Age: 56 y.o. MRN: [id number]  CC: Hospitalization Follow-up   HPI Gina [name] presents for  Diabetes: History of poorly controlled DM with non-adherence. History of left transmetatarsal amputation of the left foot due to osteomyelitis on 06/05/2016. Was d/c home on wound VAC for 7 days. Everyday wound care changes. Daughter reports performing dressing changes. Cleanses with antibacterial solution, 4X4 gauze applied with Kerlix and ace wrap. Denies any foul smelling drainage, redness, swelling, or pain at the site. Wound closure with stitches. Reports small amount of sanguinous drainage. History of recent follow up visit with orthopedics. Reports having 3 weeks of [place] services with nurse visits, with one week remaining. Daughter reports her mother is wheelchair bound. She reports no PT at this time due to lack of health coverage. Reports being in the process of obtaining Medicare services and applying for assistance. Reports taking CBG TID. CBG's ranging in 60's-170's. CBG 50-70 in am; 100-120's in afternoon; 160-175 at night. Reports non-compliance with metformin reports she has not taken since hospital discharge. Reports not taking metformin because of GI upset  Requests dental referral: Dental decay 3 months. Denies any dental pain, drainage, of facial swelling.   Depression: Reports depressed mood since amputation. Denis any SI/HI.    Outpatient Medications Prior to Visit  Medication Sig Dispense Refill  . acetaminophen (TYLENOL) 325 MG tablet Take 2 tablets (650 mg total) by mouth every 6 (six) hours as needed for mild pain (or Fever >/= 101).    . bisacodyl (DULCOLAX) 10 MG suppository Place 1 suppository (10 mg total) rectally daily as needed for moderate constipation. 12 suppository 0  . docusate sodium (COLACE) 100 MG capsule Take 1 capsule (100 mg total) by mouth 2 (two) times daily. 10 capsule 0  . insulin aspart  (NOVOLOG) 100 UNIT/ML injection Inject 0-15 Units into the skin 3 (three) times daily with meals. 10 mL 11  . lisinopril (PRINIVIL,ZESTRIL) 2.5 MG tablet Take 1 tablet (2.5 mg total) by mouth daily. 30 tablet 2  . metFORMIN (GLUCOPHAGE-XR) 500 MG 24 hr tablet Take 2 tablets (1,000 mg total) by mouth 2 (two) times daily. 120 tablet 2  . oxyCODONE (OXY IR/ROXICODONE) 5 MG immediate release tablet Take 1-2 tablets (5-10 mg total) by mouth every 4 (four) hours as needed for breakthrough pain. 30 tablet 0  . polyethylene glycol (MIRALAX) packet Take 17 g by mouth daily as needed for mild constipation. 14 each 0  . senna-docusate (SENOKOT-S) 8.6-50 MG tablet Take 2 tablets by mouth daily. 60 tablet 0  . escitalopram (LEXAPRO) 10 MG tablet Take 1 tablet (10 mg total) by mouth daily. After 1 week may increase take 2 tablets (20 mg total) by mouth daily. (Patient taking differently: Take 10 mg by mouth daily. ) 30 tablet 2  . glucose blood test strip Use as instructed 100 each 12  . glucose monitoring kit (FREESTYLE) monitoring kit Use glucometer to check sugar three times a day 1 each 0  . insulin aspart (NOVOLOG FLEXPEN) 100 UNIT/ML FlexPen Inject 2-15 Units into the skin 3 (three) times daily with meals. Take 3 U Novolog with each meal and add on more units based on you blood sugars   CBG 70 - 120: 0 units  CBG 121 - 150: 2 units  CBG 151 - 200: 3 units  CBG 201 - 250: 5 units  CBG 251 - 300: 8 units  CBG 301 -  350: 11 units  CBG 351 - 400: 15 units 15 mL 11  . Insulin Glargine (LANTUS) 100 UNIT/ML Solostar Pen Inject 30 Units into the skin daily at 10 pm. 15 mL 11  . Insulin Pen Needle 31G X 8 MM MISC 1 each by Does not apply route 4 (four) times daily - after meals and at bedtime. 100 each 0  . Lancets 30G MISC Use to check blood sugar three times a day 100 each 6   No facility-administered medications prior to visit.     ROS Review of Systems  Constitutional: Negative.   HENT: Positive for  dental problem.   Eyes: Negative.   Respiratory: Negative.   Cardiovascular: Negative.   Gastrointestinal: Negative.   Skin: Positive for wound (surgical).    Objective:  BP (!) 163/77 (BP Location: Left Arm, Patient Position: Sitting, Cuff Size: Normal)   Pulse 77   Temp 98.6 F (37 C) (Oral)   Resp 18   Ht _0  (1.575 m)   Wt 128 lb (58.1 kg)   SpO2 100%   BMI 23.41 kg/m   BP/Weight 06/23/2016 06/07/2016 6/38/4665  Systolic BP 993 570 -  Diastolic BP 77 68 -  Wt. (Lbs) 128 - 128  BMI 23.41 - 23.41  Some encounter information is confidential and restricted. Go to Review Flowsheets activity to see all data.    Physical Exam  HENT:  Head: Normocephalic and atraumatic.  Right Ear: External ear normal.  Left Ear: External ear normal.  Nose: Nose normal.  Mouth/Throat: Oropharynx is clear and moist. Dental caries present.  Eyes: Conjunctivae are normal. Pupils are equal, round, and reactive to light.  Neck: No JVD present.  Cardiovascular: Normal rate, regular rhythm, normal heart sounds and intact distal pulses.   Pulmonary/Chest: Effort normal and breath sounds normal.  Abdominal: Soft. Bowel sounds are normal.  Skin: Skin is warm and dry.  Surgical wound with stiches to the left foot. Small area about 0.5 cm in length with small amount of sanguinous drainage present. No necrotic tissue or signs of infection present. Pulse is intact 2+, extremity is warm.   Psychiatric: She exhibits a depressed mood. She expresses no homicidal and no suicidal ideation. She expresses no suicidal plans and no homicidal plans.  Nursing note and vitals reviewed.    Depression screen [facility] 2/9 06/23/2016 04/28/2016 02/17/2014  Decreased Interest 0 0 0  Down, Depressed, Hopeless 0 2 1  PHQ - 2 Score 0 2 1  Altered sleeping 0 3 -  Tired, decreased energy 0 3 -  Change in appetite 0 3 -  Feeling bad or failure about yourself  0 2 -  Trouble concentrating 0 1 -  Moving slowly or fidgety/restless  0 2 -  PHQ-9 Score 0 16 -    GAD 7 : Generalized Anxiety Score 06/23/2016 04/28/2016  Nervous, Anxious, on Edge 0 3  Control/stop worrying 0 0  Worry too much - different things 0 3  Trouble relaxing 0 3  Restless 0 -  Easily annoyed or irritable 0 3  Afraid - awful might happen 0 2  Total GAD 7 Score 0 -    Assessment & Plan:   Problem List Items Addressed This Visit      Cardiovascular and Mediastinum   HTN (hypertension)   Relevant Medications   amLODipine (NORVASC) 10 MG tablet   DM (diabetes mellitus), type 2, uncontrolled, periph vascular complic ([place]) - Primary   -Concerned with persistently low am  CBG. Patient often reports poor appetite in am. Insulin dose     .decreased.   Relevant Medications   Insulin Glargine (LANTUS) 100 UNIT/ML Solostar Pen   amLODipine (NORVASC) 10 MG tablet   insulin aspart (NOVOLOG FLEXPEN) 100 UNIT/ML FlexPen   Insulin Pen Needle 31G X 8 MM MISC   Other Relevant Orders   CBC with Differential (Completed)   CMP14+EGFR (Completed)   Glucose (CBG) (Completed)   Ambulatory referral to Ophthalmology   Ambulatory referral to diabetic education     Endocrine   Diabetes ([place])   Relevant Medications   Insulin Glargine (LANTUS) 100 UNIT/ML Solostar Pen   insulin aspart (NOVOLOG FLEXPEN) 100 UNIT/ML FlexPen     Other   S/P transmetatarsal amputation of foot, left (H[facility])   -Dressing change performed in office.    Relevant Orders   Ambulatory referral to [place]    Other Visit Diagnoses    Urine test positive for microalbuminuria       Relevant Medications   Incontinence Supply Disposable (DEPEND UNDERWEAR SM/MED) MISC   Dental decay       Relevant Orders   Ambulatory referral to Dentistry   Urinary incontinence, unspecified type       Incontinence of feces, unspecified fecal incontinence type       Relevant Medications   Incontinence Supply Disposable (DEPEND UNDERWEAR SM/MED) MISC   Depression with anxiety       Provided with  counseling resources.   Relevant Medications   escitalopram (LEXAPRO) 10 MG tablet      Meds ordered this encounter  Medications  . Incontinence Supply Disposable (DEPEND UNDERWEAR SM/MED) MISC    Sig: 1 kit by Does not apply route as needed.    Order Specific Question:   Supervising Provider    Answer:   Tresa Garter [id number]  . DISCONTD: amLODipine (NORVASC) 10 MG tablet    Sig: Take 1 tablet (10 mg total) by mouth daily.    Dispense:  90 tablet    Refill:  0    Order Specific Question:   Supervising Provider    Answer:   Tresa Garter [id number]  . Insulin Glargine (LANTUS) 100 UNIT/ML Solostar Pen    Sig: Inject 28 Units into the skin daily at 10 pm.    Dispense:  15 mL    Refill:  11    Order Specific Question:   Supervising Provider    Answer:   Tresa Garter [[id number]]  . amLODipine (NORVASC) 10 MG tablet    Sig: Take 1 tablet (10 mg total) by mouth daily.    Dispense:  90 tablet    Refill:  0    Order Specific Question:   Supervising Provider    Answer:   Tresa Garter [id number]  . insulin aspart (NOVOLOG FLEXPEN) 100 UNIT/ML FlexPen    Sig: Inject 2-15 Units into the skin 3 (three) times daily with meals. Take 3 U Novolog with each meal and add on more units based on you blood sugars   CBG 70 - 120: 0 units  CBG 121 - 150: 2 units  CBG 151 - 200: 3 units  CBG 201 - 250: 5 units  CBG 251 - 300: 8 units  CBG 301 - 350: 11 units  CBG 351 - 400: 15 units    Dispense:  15 mL    Refill:  11    Order Specific Question:   Supervising Provider    Answer:  JEGEDE, OLUGBEMIGA E [id number]  . escitalopram (LEXAPRO) 10 MG tablet    Sig: Take 1 tablet (10 mg total) by mouth daily. After 1 week may increase take 2 tablets (20 mg total) by mouth daily.    Dispense:  90 tablet    Refill:  0    Order Specific Question:   Supervising Provider    Answer:   Tresa Garter [id number]  . Insulin Pen Needle 31G X 8 MM MISC    Sig: 1 each by Does  not apply route 4 (four) times daily - after meals and at bedtime.    Dispense:  100 each    Refill:  0    Order Specific Question:   Supervising Provider    Answer:   Tresa Garter [id number]    Follow-up: Return in about 2 weeks (around 07/07/2016) for Diabetes .   Alfonse Spruce FNP

## 2016-06-23 NOTE — [place] Notes (Signed)
Patient is here for HFU  Patient denies pain at this time.  Patient has not taken medication today. Patient has not eaten today. 

## 2016-06-24 LAB — CBC WITH DIFFERENTIAL/PLATELET
Basophils Absolute: 0 10*3/uL (ref 0.0–0.2)
Basos: 0 %
EOS (ABSOLUTE): 0.1 10*3/uL (ref 0.0–0.4)
Eos: 1 %
Hematocrit: 28.3 % — ABNORMAL LOW (ref 34.0–46.6)
Hemoglobin: 8.8 g/dL — ABNORMAL LOW (ref 11.1–15.9)
IMMATURE GRANS (ABS): 0 10*3/uL (ref 0.0–0.1)
Immature Granulocytes: 0 %
Lymphocytes Absolute: 1.9 10*3/uL (ref 0.7–3.1)
Lymphs: 27 %
MCH: 28.8 pg (ref 26.6–33.0)
MCHC: 31.1 g/dL — ABNORMAL LOW (ref 31.5–35.7)
MCV: 93 fL (ref 79–97)
Monocytes Absolute: 0.5 10*3/uL (ref 0.1–0.9)
Monocytes: 7 %
NEUTROS ABS: 4.5 10*3/uL (ref 1.4–7.0)
Neutrophils: 65 %
PLATELETS: 473 10*3/uL — AB (ref 150–379)
RBC: 3.06 x10E6/uL — ABNORMAL LOW (ref 3.77–5.28)
RDW: 15.1 % (ref 12.3–15.4)
WBC: 6.9 10*3/uL (ref 3.4–10.8)

## 2016-06-24 LAB — CMP14+EGFR
ALT: 25 IU/L (ref 0–32)
AST: 28 IU/L (ref 0–40)
Albumin/Globulin Ratio: 0.9 — ABNORMAL LOW (ref 1.2–2.2)
Albumin: 3.3 g/dL — ABNORMAL LOW (ref 3.5–5.5)
Alkaline Phosphatase: 82 IU/L (ref 39–117)
BUN/Creatinine Ratio: 30 — ABNORMAL HIGH (ref 9–23)
BUN: 14 mg/dL (ref 6–24)
Bilirubin Total: <0.2 mg/dL (ref 0.0–1.2)
CALCIUM: 9.2 mg/dL (ref 8.7–10.2)
CO2: 26 mmol/L (ref 18–29)
Chloride: 99 mmol/L (ref 96–106)
Creatinine, Ser: 0.46 mg/dL — ABNORMAL LOW (ref 0.57–1.00)
GFR calc Af Amer: 130 mL/min/{1.73_m2} (ref >59)
GFR, EST NON AFRICAN AMERICAN: 112 mL/min/{1.73_m2} (ref >59)
Globulin, Total: 3.5 g/dL (ref 1.5–4.5)
Glucose: 100 mg/dL — ABNORMAL HIGH (ref 65–99)
Potassium: 4.4 mmol/L (ref 3.5–5.2)
Sodium: 138 mmol/L (ref 134–144)
Total Protein: 6.8 g/dL (ref 6.0–8.5)

## 2016-06-28 ENCOUNTER — Other Ambulatory Visit: Payer: Self-pay | Admitting: Family Medicine

## 2016-06-28 DIAGNOSIS — D649 Anemia, unspecified: Secondary | ICD-10-CM

## 2016-06-28 MED ORDER — FERROUS SULFATE 325 (65 FE) MG PO TABS: 325.0000 mg | ORAL_TABLET | Freq: Two times a day (BID) | ORAL | 2 refills | Status: DC

## 2016-06-28 NOTE — Telephone Encounter (Signed)
-----   Message from Alfonse Spruce, [place] sent at 06/28/2016  5:17 PM EDT ----- -CBC lab indicates that you have chronic anemia. You will be prescribed an iron supplement. You can take an empty stomach with orange juice for better absorption. Recommend repeat labs in 1 month and again in 3 months.  -Recommend referral for colonoscopy to evaluate source of chronic anemia. -Increase your dietary iron intake. Good sources of iron include dark green leafy vegetables, meats, beans, and iron fortified cereals.  -If develop any shortness of breath, heart palpitations, confusion, or altered mental status go to the ED.  -Increase your dietary protein intake. If you develop swelling.

## 2016-06-28 NOTE — Telephone Encounter (Signed)
CMA call patient to let know about results   Patient was aware and understood

## 2016-06-29 ENCOUNTER — Ambulatory Visit (INDEPENDENT_AMBULATORY_CARE_PROVIDER_SITE_OTHER): Payer: Self-pay | Admitting: Orthopedic Surgery

## 2016-06-29 DIAGNOSIS — Z89432 Acquired absence of left foot: Secondary | ICD-10-CM

## 2016-06-29 MED FILL — FERROUS SULFATE 325 MG TAB: 325 (65 FE) | 30 days supply | Qty: 60 | Fill #0

## 2016-06-29 NOTE — [place] Notes (Signed)
   Post-Op Visit Note   Patient: Gina [name]           Date of Birth: 04-15-1960           MRN: [id number] Visit Date: 06/29/2016 PCP: Fredia Beets, FNP  Chief Complaint: No chief complaint on file.   HPI:  The patient is a 56 year old woman who presents status post left transmetatarsal amputation. This sutures remain in place there is great interval healing. She has been nonweightbearing with a postop shoe in a wheelchair.    Ortho Exam  the transmetatarsal amputation is well-healed there is no gaping no drainage does have moderate swelling no erythema no sign of infection. Sutures harvested today without incident.  Visit Diagnoses:  1. S/P transmetatarsal amputation of foot, left ([place])     Plan: Him may begin touchdown weightbearing through the heel. Continue with daily wound cleansing and checks. Dry dressings. Follow-up in 2 weeks.  Follow-Up Instructions: Return in about 2 weeks (around 07/13/2016).   Imaging: No results found.  Orders:  No orders of the defined types were placed in this encounter.  No orders of the defined types were placed in this encounter.    PMFS History: Patient Active Problem List   Diagnosis Date Noted  . S/P transmetatarsal amputation of foot, left ([place]) 06/15/2016  . DM (diabetes mellitus), type 2, uncontrolled, periph vascular complic ([place]) 25/95/6387  . Anemia of chronic disease 06/07/2016  . Cellulitis of left foot   . Protein-calorie malnutrition, severe 06/03/2016  . Osteomyelitis due to type 2 diabetes mellitus ([place]) 06/02/2016  . Septic arthritis of interphalangeal joint of toe of left foot ([place]) 06/02/2016  . Colon cancer screening 02/17/2014  . Breast cancer screening 02/17/2014  . Diabetes ([place]) 07/28/2013  . Cholelithiases 07/28/2013  . DKA (diabetic ketoacidoses) ([place]) 06/18/2013  . HTN (hypertension) 06/18/2013  . Cellulitis of female genitalia 08/03/2012  . Hyperglycemia 07/31/2012  . Candidal skin infection  07/31/2012  . Cellulitis and abscess 07/31/2012   Past Medical History:  Diagnosis Date  . Diabetes mellitus   . Hypertension     Family History  Problem Relation Age of Onset  . Diabetes type II Father   . CAD Father   . Prostate cancer Father   . Diabetes Mellitus II Brother     Past Surgical History:  Procedure Laterality Date  . AMPUTATION Left 06/05/2016   Procedure: LEFT FOOT TRANSMETATARSAL AMPUTATION;  Surgeon: [name], MD;  Location: [facility];  Service: Orthopedics;  Laterality: Left;  . TUBAL LIGATION     Social History   Occupational History  . Not on file.   Social History Main Topics  . Smoking status: Never Smoker  . Smokeless tobacco: Never Used  . Alcohol use No     Comment: occasionally  . Drug use: No  . Sexual activity: Not on file

## 2016-07-13 ENCOUNTER — Encounter (INDEPENDENT_AMBULATORY_CARE_PROVIDER_SITE_OTHER): Payer: Self-pay

## 2016-07-13 ENCOUNTER — Encounter (INDEPENDENT_AMBULATORY_CARE_PROVIDER_SITE_OTHER): Payer: Self-pay | Admitting: Orthopedic Surgery

## 2016-07-13 ENCOUNTER — Ambulatory Visit (INDEPENDENT_AMBULATORY_CARE_PROVIDER_SITE_OTHER): Payer: Self-pay | Admitting: Orthopedic Surgery

## 2016-07-13 VITALS — Ht 62.0 in | Wt 128.0 lb

## 2016-07-13 DIAGNOSIS — Z89432 Acquired absence of left foot: Secondary | ICD-10-CM

## 2016-07-13 NOTE — [place] Notes (Signed)
Office Visit Note   Patient: Gina [name]           Date of Birth: 1960/07/21           MRN: [id number] Visit Date: 07/13/2016              Requested by: Alfonse Spruce, [place], [place] [address] PCP: Fredia Beets, FNP  Chief Complaint  Patient presents with  . Left Foot - Routine Post Op    3/216/18 left transmet amputation      HPI: Patient is status post transmetatarsal amputation the left. Patient has been ambulating on her foot without support she is been walking around the neighborhood. She has massive venous stasis swelling.  Assessment & Plan: Visit Diagnoses:  1. S/P transmetatarsal amputation of foot, left ([place])     Plan: Recommended elevation with her foot level with her heart discussed that she can do partial weightbearing around the house using her walker. She is to obtain a pair of medical compression socks at 15-20 mm of compression and she is to wear these daily. Discussed the risk of increased swelling that she could develop ulcerations and potential amputation of her foot.  Patient was given a prescription for [employer] for extra-depth shoes custom orthotics spacer [place]bin plate and double upright brace in the left shoe. Patient states she is currently applying for Medicaid.  Follow-Up Instructions: Return in about 3 weeks (around 08/03/2016).   Ortho Exam  Patient is alert, oriented, no adenopathy, well-dressed, normal affect, normal respiratory effort. Examination patient has massive venous stasis swelling in the left leg. There is no cellulitis no drainage the surgical incision is well-healed there no plantar ulcers.  Imaging: No results found.  Labs: Lab Results  Component Value Date   HGBA1C 14.3 (H) 06/01/2016   HGBA1C >15 04/28/2016   HGBA1C 14.9 02/17/2014   ESRSEDRATE 114 (H) 06/01/2016   ESRSEDRATE 87 (H) 05/23/2016   CRP 5.3 (H) 06/01/2016   CRP 5.9 (H) 05/23/2016   LABURIC 3.8 06/04/2016   LABURIC 4.1  06/01/2016   REPTSTATUS 06/08/2016 FINAL 06/04/2016   GRAMSTAIN  06/04/2016    MODERATE WBC PRESENT, PREDOMINANTLY PMN NO ORGANISMS SEEN    CULT  06/04/2016    No growth aerobically or anaerobically. Performed at [facility], [place] [address] [place], [place] [address]    [place] (A) 04/01/2016    Orders:  No orders of the defined types were placed in this encounter.  No orders of the defined types were placed in this encounter.    Procedures: No procedures performed  Clinical Data: No additional findings.  ROS:  All other systems negative, except as noted in the HPI. Review of Systems  Objective: Vital Signs: Ht 5\' 2"  (1.575 m)   Wt 128 lb (58.1 kg)   BMI 23.41 kg/m   Specialty Comments:  No specialty comments available.  PMFS History: Patient Active Problem List   Diagnosis Date Noted  . S/P transmetatarsal amputation of foot, left ([place]) 06/15/2016  . DM (diabetes mellitus), type 2, uncontrolled, periph vascular complic ([place]) 75/17/0017  . Anemia of chronic disease 06/07/2016  . Cellulitis of left foot   . Protein-calorie malnutrition, severe 06/03/2016  . Osteomyelitis due to type 2 diabetes mellitus ([place]) 06/02/2016  . Septic arthritis of interphalangeal joint of toe of left foot ([place]) 06/02/2016  . Colon cancer screening 02/17/2014  . Breast cancer screening 02/17/2014  . Diabetes ([place]) 07/28/2013  . Cholelithiases  07/28/2013  . DKA (diabetic ketoacidoses) ([place]) 06/18/2013  . HTN (hypertension) 06/18/2013  . Cellulitis of female genitalia 08/03/2012  . Hyperglycemia 07/31/2012  . Candidal skin infection 07/31/2012  . Cellulitis and abscess 07/31/2012   Past Medical History:  Diagnosis Date  . Diabetes mellitus   . Hypertension     Family History  Problem Relation Age of Onset  . Diabetes type II Father   . CAD Father   . Prostate cancer Father   . Diabetes Mellitus II Brother     Past Surgical History:  Procedure  Laterality Date  . AMPUTATION Left 06/05/2016   Procedure: LEFT FOOT TRANSMETATARSAL AMPUTATION;  Surgeon: [name], MD;  Location: [facility];  Service: Orthopedics;  Laterality: Left;  . TUBAL LIGATION     Social History   Occupational History  . Not on file.   Social History Main Topics  . Smoking status: Never Smoker  . Smokeless tobacco: Never Used  . Alcohol use No     Comment: occasionally  . Drug use: No  . Sexual activity: Not on file

## 2016-07-18 ENCOUNTER — Encounter: Payer: Self-pay | Admitting: Registered"

## 2016-07-18 ENCOUNTER — Encounter: Payer: Self-pay | Attending: Family Medicine | Admitting: Registered"

## 2016-07-18 DIAGNOSIS — E118 Type 2 diabetes mellitus with unspecified complications: Secondary | ICD-10-CM

## 2016-07-18 DIAGNOSIS — Z713 Dietary counseling and surveillance: Secondary | ICD-10-CM | POA: Insufficient documentation

## 2016-07-18 DIAGNOSIS — E119 Type 2 diabetes mellitus without complications: Secondary | ICD-10-CM | POA: Insufficient documentation

## 2016-07-18 NOTE — Patient Instructions (Addendum)
Talk to your doctor about diarrhea. Ask an easy yes/no question about reducing the metformin, for example "Can I take 1/2 the dose?"  Plan:  Aim for 2-3 Carb Choices per meal (30-45 grams) +/- 1 either way  Aim for 0-1 Carbs per snack if hungry  Include protein with your meals and snacks Consider reading food labels for Total Carbohydrate and Sat Fat Grams of foods Consider increasing your activity level daily as tolerated Continue checking BG at alternate times per day as directed by MD  Continue taking medication as directed by MD

## 2016-07-18 NOTE — Progress Notes (Signed)
Diabetes Self-Management Education  Visit Type: First/Initial  Appt. Start Time: 1130 Appt. End Time: 1230  07/18/2016  [name], identified by name and date of birth, is a 56 y.o. female with a diagnosis of Diabetes: Type 2.   ASSESSMENT Pt is accompanied by her daughter who participates in her care. Dietary and SMBG information may not be accurate due to patient is poor historian. Pt's daughter reports that the patient gets angry at family members who try to remind her of portion sizes and her daughter said that she eats uncontrollably with sweets. RD provided information on importance of getting enough to eat with balanced meals and snacks to help control carb cravings.  Pt states she takes metformin as prescribed, 2 pills in the morning and 2 pills in the evening. Later in the visit pt said she will not take her medicine if she is going out because she doesn't want to have diarrhea out in public. It was not clear if she meant just the metformin or the insulin as well.  Pt's daughter reports that her mother has a lot of low blood sugar events and the insulin dose has been reduced. Pt did not bring her SMBG data with her.  Height 5\' 2"  (1.575 m), weight 129 lb 12.8 oz (58.9 kg). Body mass index is 23.74 kg/m.      Diabetes Self-Management Education - 07/18/16 1126      Visit Information   Visit Type First/Initial     Initial Visit   Diabetes Type Type 2   Are you currently following a meal plan? Yes   What type of meal plan do you follow? watching carbs   Are you taking your medications as prescribed? Yes   Date Diagnosed early 43's     Health Coping   How would you rate your overall health? Excellent     Psychosocial Assessment   Patient Belief/Attitude about Diabetes Motivated to manage diabetes   Self-care barriers Other (comment)  poor historian, per daughter pt has memory issues   Other persons present Family Member  daughter who participates in care for pt    Learning Readiness Ready   How often do you need to have someone help you when you read instructions, pamphlets, or other written materials from your doctor or pharmacy? 3 - Sometimes   What is the last grade level you completed in school? 01SW     Complications   Last HgB A1C per patient/outside source 14.3 %  08/08/16 per chart   How often do you check your blood sugar? > 4 times/day   Fasting Blood glucose range (mg/dL) --  80-117   Have you had a dilated eye exam in the past 12 months? Yes   Have you had a dental exam in the past 12 months? No   Are you checking your feet? Yes   How many days per week are you checking your feet? 3     Dietary Intake   Breakfast 1 egg, bacon, wheat toast OR honey nut oats, mixed fruit (frozen)   Snack (morning) none OR wehat things with pb   Lunch [place] burger, vegetables (corn) OR salad with chicken   Snack (afternoon) popcorn, milk   Dinner baked chicken, mixed veggies, brown rice (doesn't like) potatos   Snack (evening) cheese & crackers OR tuna & crackers   Beverage(s) water, milk, sparkling water     Exercise   Exercise Type Light (walking / raking leaves)   How many  days per week to you exercise? 7   How many minutes per day do you exercise? 10   Total minutes per week of exercise 70     Patient Education   Previous Diabetes Education No   Nutrition management  Role of diet in the treatment of diabetes and the relationship between the three main macronutrients and blood glucose level;Carbohydrate counting   Physical activity and exercise  Role of exercise on diabetes management, blood pressure control and cardiac health.   Monitoring Identified appropriate SMBG and/or A1C goals.   Psychosocial adjustment Helped patient identify a support system for diabetes management     Individualized Goals (developed by patient)   Nutrition Follow meal plan discussed   Physical Activity 15 minutes per day  arm chair activited until foot heals    Medications take my medication as prescribed  talk to MD re: side effects     Outcomes   Expected Outcomes Demonstrated interest in learning. Expect positive outcomes   Future DMSE PRN   Program Status Completed      Individualized Plan for Diabetes Self-Management Training:   Learning Objective:  Patient will have a greater understanding of diabetes self-management. Patient education plan is to attend individual and/or group sessions per assessed needs and concerns.  Patient Instructions  Talk to your doctor about diarrhea. Ask an easy yes/no question about reducing the metformin, for example "Can I take 1/2 the dose?"  Plan:  Aim for 2-3 Carb Choices per meal (30-45 grams) +/- 1 either way  Aim for 0-1 Carbs per snack if hungry  Include protein with your meals and snacks Consider reading food labels for Total Carbohydrate and Sat Fat Grams of foods Consider increasing your activity level daily as tolerated Continue checking BG at alternate times per day as directed by MD  Continue taking medication as directed by MD     Expected Outcomes:  Demonstrated interest in learning. Expect positive outcomes  Education material provided: Living Well with Diabetes, A1C conversion sheet, Meal plan card, My Plate, Snack sheet and Support group flyer, arm chair exercises, enrollment instructions for American Diabetes Association Living well 1 year program  If problems or questions, patient to contact team via:  Phone  Future DSME appointment: PRN

## 2016-08-08 ENCOUNTER — Encounter: Payer: Self-pay | Admitting: Family Medicine

## 2016-08-08 ENCOUNTER — Ambulatory Visit (INDEPENDENT_AMBULATORY_CARE_PROVIDER_SITE_OTHER): Payer: Self-pay | Admitting: Orthopedic Surgery

## 2016-08-08 MED FILL — LISINOPRIL 2.5 MG TABLET: 2.5 | 30 days supply | Qty: 30 | Fill #0

## 2016-08-16 ENCOUNTER — Ambulatory Visit (INDEPENDENT_AMBULATORY_CARE_PROVIDER_SITE_OTHER): Payer: Self-pay | Admitting: Orthopedic Surgery

## 2017-02-20 ENCOUNTER — Emergency Department (HOSPITAL_COMMUNITY)
Admission: EM | Admit: 2017-02-20 | Discharge: 2017-02-21 | Disposition: A | Discharge status: Home / Self Care | Payer: Medicaid Other | Attending: Emergency Medicine | Admitting: Emergency Medicine

## 2017-02-20 ENCOUNTER — Encounter (HOSPITAL_COMMUNITY): Payer: Self-pay | Admitting: Emergency Medicine

## 2017-02-20 DIAGNOSIS — I10 Essential (primary) hypertension: Secondary | ICD-10-CM | POA: Insufficient documentation

## 2017-02-20 DIAGNOSIS — E1165 Type 2 diabetes mellitus with hyperglycemia: Secondary | ICD-10-CM | POA: Insufficient documentation

## 2017-02-20 DIAGNOSIS — R739 Hyperglycemia, unspecified: Secondary | ICD-10-CM

## 2017-02-20 DIAGNOSIS — R45851 Suicidal ideations: Secondary | ICD-10-CM | POA: Diagnosis not present

## 2017-02-20 DIAGNOSIS — Z046 Encounter for general psychiatric examination, requested by authority: Secondary | ICD-10-CM

## 2017-02-20 DIAGNOSIS — F99 Mental disorder, not otherwise specified: Secondary | ICD-10-CM | POA: Diagnosis present

## 2017-02-20 LAB — COMPREHENSIVE METABOLIC PANEL
ALK PHOS: 103 U/L (ref 38–126)
ALT: 30 U/L (ref 14–54)
AST: 27 U/L (ref 15–41)
Albumin: 3.4 g/dL — ABNORMAL LOW (ref 3.5–5.0)
Anion gap: 9 (ref 5–15)
BILIRUBIN TOTAL: 0.5 mg/dL (ref 0.3–1.2)
BUN: 9 mg/dL (ref 6–20)
CALCIUM: 9.2 mg/dL (ref 8.9–10.3)
CO2: 26 mmol/L (ref 22–32)
CREATININE: 0.73 mg/dL (ref 0.44–1.00)
Chloride: 90 mmol/L — ABNORMAL LOW (ref 101–111)
GFR calc Af Amer: >60 mL/min (ref >60)
GFR calc non Af Amer: >60 mL/min (ref >60)
Glucose, Bld: 787 mg/dL (ref 65–99)
Potassium: 3.9 mmol/L (ref 3.5–5.1)
Sodium: 125 mmol/L — ABNORMAL LOW (ref 135–145)
Total Protein: 7.6 g/dL (ref 6.5–8.1)

## 2017-02-20 LAB — CBG MONITORING, ED: GLUCOSE-CAPILLARY: 442 mg/dL — AB (ref 65–99)

## 2017-02-20 LAB — CBC
HCT: 34.2 % — ABNORMAL LOW (ref 36.0–46.0)
Hemoglobin: 11.4 g/dL — ABNORMAL LOW (ref 12.0–15.0)
MCH: 30.8 pg (ref 26.0–34.0)
MCHC: 33.3 g/dL (ref 30.0–36.0)
MCV: 92.4 fL (ref 78.0–100.0)
PLATELETS: 230 10*3/uL (ref 150–400)
RBC: 3.7 MIL/uL — ABNORMAL LOW (ref 3.87–5.11)
RDW: 12.5 % (ref 11.5–15.5)
WBC: 10.3 10*3/uL (ref 4.0–10.5)

## 2017-02-20 LAB — SALICYLATE LEVEL: Salicylate Lvl: <7 mg/dL (ref 2.8–30.0)

## 2017-02-20 LAB — I-STAT BETA HCG BLOOD, ED (MC, WL, AP ONLY): I-stat hCG, quantitative: 8.6 m[IU]/mL — ABNORMAL HIGH (ref <5)

## 2017-02-20 LAB — ACETAMINOPHEN LEVEL: Acetaminophen (Tylenol), Serum: <10 ug/mL — ABNORMAL LOW (ref 10–30)

## 2017-02-20 LAB — ETHANOL: Alcohol, Ethyl (B): <10 mg/dL (ref <10)

## 2017-02-20 MED ORDER — INSULIN ASPART 100 UNIT/ML FLEXPEN: 2.0000 [IU] | PEN_INJECTOR | Freq: Three times a day (TID) | SUBCUTANEOUS | Status: DC

## 2017-02-20 MED ORDER — AMLODIPINE BESYLATE 5 MG PO TABS
10.0000 mg | ORAL_TABLET | Freq: Every day | ORAL | Status: DC
  Administered 2017-02-21: 10 mg via ORAL
  Filled 2017-02-20: qty 2

## 2017-02-20 MED ORDER — SENNOSIDES-DOCUSATE SODIUM 8.6-50 MG PO TABS
2.0000 | ORAL_TABLET | Freq: Every day | ORAL | Status: DC
  Administered 2017-02-21: 2 via ORAL
  Filled 2017-02-20: qty 2

## 2017-02-20 MED ORDER — POLYETHYLENE GLYCOL 3350 17 G PO PACK: 17.0000 g | PACK | Freq: Every day | ORAL | Status: DC | PRN

## 2017-02-20 MED ORDER — LACTATED RINGERS IV BOLUS (SEPSIS)
1000.0000 mL | Freq: Once | INTRAVENOUS | Status: AC
  Administered 2017-02-20: 1000 mL via INTRAVENOUS

## 2017-02-20 MED ORDER — INSULIN ASPART 100 UNIT/ML ~~LOC~~ SOLN
10.0000 [IU] | Freq: Once | SUBCUTANEOUS | Status: AC
  Administered 2017-02-20: 10 [IU] via INTRAVENOUS
  Filled 2017-02-20: qty 1

## 2017-02-20 MED ORDER — DOCUSATE SODIUM 100 MG PO CAPS
100.0000 mg | ORAL_CAPSULE | Freq: Two times a day (BID) | ORAL | Status: DC
  Administered 2017-02-21: 100 mg via ORAL
  Filled 2017-02-20: qty 1

## 2017-02-20 MED ORDER — INSULIN GLARGINE 100 UNIT/ML ~~LOC~~ SOLN
28.0000 [IU] | Freq: Every day | SUBCUTANEOUS | Status: DC
  Administered 2017-02-21: 28 [IU] via SUBCUTANEOUS
  Filled 2017-02-20 (×2): qty 0.28

## 2017-02-20 MED ORDER — ESCITALOPRAM OXALATE 10 MG PO TABS
10.0000 mg | ORAL_TABLET | Freq: Every day | ORAL | Status: DC
  Administered 2017-02-21: 10 mg via ORAL
  Filled 2017-02-20: qty 1

## 2017-02-20 MED ORDER — INSULIN ASPART 100 UNIT/ML ~~LOC~~ SOLN
15.0000 [IU] | Freq: Once | SUBCUTANEOUS | Status: AC
  Administered 2017-02-20: 15 [IU] via INTRAVENOUS
  Filled 2017-02-20: qty 1

## 2017-02-20 MED ORDER — FERROUS SULFATE 325 (65 FE) MG PO TABS
325.0000 mg | ORAL_TABLET | Freq: Two times a day (BID) | ORAL | Status: DC
  Administered 2017-02-21 (×2): 325 mg via ORAL
  Filled 2017-02-20 (×4): qty 1

## 2017-02-20 MED ORDER — INSULIN ASPART 100 UNIT/ML ~~LOC~~ SOLN
0.0000 [IU] | Freq: Three times a day (TID) | SUBCUTANEOUS | Status: DC
  Administered 2017-02-21: 5 [IU] via SUBCUTANEOUS
  Filled 2017-02-20 (×2): qty 1

## 2017-02-20 MED ORDER — METFORMIN HCL ER 500 MG PO TB24
1000.0000 mg | ORAL_TABLET | Freq: Two times a day (BID) | ORAL | Status: DC
  Filled 2017-02-20 (×3): qty 2

## 2017-02-20 MED ORDER — LISINOPRIL 2.5 MG PO TABS
2.5000 mg | ORAL_TABLET | Freq: Every day | ORAL | Status: DC
  Administered 2017-02-21: 2.5 mg via ORAL
  Filled 2017-02-20: qty 1

## 2017-02-20 MED ORDER — BISACODYL 10 MG RE SUPP: 10.0000 mg | Freq: Every day | RECTAL | Status: DC | PRN
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
## 2017-02-20 NOTE — ED Notes (Signed)
Staffing contacted for suicide sitter. Pt given burgundy scrubs and security called to be wanded.

## 2017-02-20 NOTE — ED Notes (Signed)
CBG- 442, Kim, RN notified

## 2017-02-20 NOTE — ED Notes (Signed)
Pt's belongings inventoried and placed in locker #7 and #8. Pt's cane placed in front of lockers. Two valuables envelopes in security. See belongings flowsheet for more details. Please note that patient placed multiple bedpans and basins from triage room in belongings bags, items that belong to hospital were discarded.

## 2017-02-20 NOTE — ED Triage Notes (Addendum)
Pt brought in by [employer] under ivc taken out by patient's family, pt has hx of bipolar but refuses to take medications, pt having paranoid and hallucinations and states she no longer wants to live, denies plan to hurt herself. [facility] reports pt was taken to [facility] where pts cbg was found to be 400 so pt was sent here.

## 2017-02-20 NOTE — ED Notes (Signed)
EDP at bedside  

## 2017-02-21 ENCOUNTER — Ambulatory Visit: Payer: Self-pay

## 2017-02-21 ENCOUNTER — Ambulatory Visit: Payer: Self-pay | Admitting: Family Medicine

## 2017-02-21 LAB — URINALYSIS, ROUTINE W REFLEX MICROSCOPIC
BILIRUBIN URINE: NEGATIVE
Glucose, UA: >=500 mg/dL — AB
Ketones, ur: NEGATIVE mg/dL
LEUKOCYTES UA: NEGATIVE
NITRITE: NEGATIVE
PROTEIN: NEGATIVE mg/dL
Specific Gravity, Urine: 1.03 (ref 1.005–1.030)
pH: 6 (ref 5.0–8.0)

## 2017-02-21 LAB — CBG MONITORING, ED
GLUCOSE-CAPILLARY: 248 mg/dL — AB (ref 65–99)
GLUCOSE-CAPILLARY: 202 mg/dL — AB (ref 65–99)
GLUCOSE-CAPILLARY: 191 mg/dL — AB (ref 65–99)
Glucose-Capillary: 193 mg/dL — ABNORMAL HIGH (ref 65–99)

## 2017-02-21 LAB — POC URINE PREG, ED: Preg Test, Ur: NEGATIVE

## 2017-02-21 LAB — RAPID URINE DRUG SCREEN, HOSP PERFORMED
Amphetamines: NOT DETECTED
BENZODIAZEPINES: NOT DETECTED
Barbiturates: NOT DETECTED
Cocaine: NOT DETECTED
OPIATES: NOT DETECTED
Tetrahydrocannabinol: NOT DETECTED

## 2017-02-21 MED ORDER — INSULIN ASPART 100 UNIT/ML ~~LOC~~ SOLN
0.0000 [IU] | Freq: Three times a day (TID) | SUBCUTANEOUS | Status: DC
  Administered 2017-02-21: 3 [IU] via SUBCUTANEOUS
  Administered 2017-02-21: 5 [IU] via SUBCUTANEOUS
  Filled 2017-02-21: qty 1

## 2017-02-21 NOTE — BH Assessment (Signed)
Tele Assessment Note   Patient Name: Gina [name] MRN: [id number] Referring Physician: Ward, PA Location of Patient: [facility] Location of Provider: [place] XXXMcCain is an 56 y.o. female. Pt denies SI/HI/AVH. Pt was IVCd by her daughter due to [place].  Pt was a poor historian. Pt states that "everything is fine." Pt states that she is upset with her family because they continuously "bother" her and they took all of her items out of her home. Pt was oriented x4.    Collateral Contact: Pt's daugther states that the Pt has been diagnosed with Schizophrenia and has not been taking her medication. Pt is prescribed Lexapro due to AVH. Pt has been hoarding and the family had to clean out her home which upset the Pt. Pt is seen by [facility] but does not attend appointments.  Pt is in a wheelchair. Pt had her toes amputated. Pt was septic. Daughter is concerned about the Pt being able to take care of herself.  Per Gina Grates, NP Pt does not meet inpatient criteria. Recommends SW consult.  Pt's daughter informed. Pt's daughter states she will check on her mother's insurance concerns as well.  Diagnosis:  F25.0 Schizoaffective, Bipolar  Past Medical History:  Past Medical History:  Diagnosis Date  . Diabetes mellitus   . Hypertension     Past Surgical History:  Procedure Laterality Date  . AMPUTATION Left 06/05/2016   Procedure: LEFT FOOT TRANSMETATARSAL AMPUTATION;  Surgeon: Gina Minion, MD;  Location: [facility];  Service: Orthopedics;  Laterality: Left;  . TUBAL LIGATION      Family History:  Family History  Problem Relation Age of Onset  . Diabetes type II Father   . CAD Father   . Prostate cancer Father   . Diabetes Mellitus II Brother     Social History:  reports that  has never smoked. she has never used smokeless tobacco. She reports that she does not drink alcohol or use drugs.  Additional Social History:  Alcohol / Drug Use Pain Medications:  please see mar Prescriptions: please see mar Over the Counter: please see mar History of alcohol / drug use?: No history of alcohol / drug abuse Longest period of sobriety (when/how long): NA  CIWA: CIWA-Ar BP: 137/73 Pulse Rate: 88 COWS:    PATIENT STRENGTHS: (choose at least two) Average or above average intelligence Communication skills  Allergies:  Allergies  Allergen Reactions  . Metformin And Related Other (See Comments)    Upset stomach    Home Medications:  (Not in a hospital admission)  OB/GYN Status:  No LMP recorded. Patient is not currently having periods (Reason: Perimenopausal).  General Assessment Data Location of Assessment: [facility] ED TTS Assessment: In system Is this a Tele or Face-to-Face Assessment?: Tele Assessment Is this an Initial Assessment or a Re-assessment for this encounter?: Initial Assessment Marital status: Single Maiden name: NA Is patient pregnant?: No Pregnancy Status: No Living Arrangements: Alone Can pt return to current living arrangement?: Yes Admission Status: Involuntary Is patient capable of signing voluntary admission?: Yes Referral Source: Self/Family/Friend Insurance type: SP     Crisis Care Plan Living Arrangements: Alone Legal Guardian: Other:(self) Name of Psychiatrist: Beverly [name] Name of Therapist: [facility]     Risk to self with the past 6 months Suicidal Ideation: No Has patient been a risk to self within the past 6 months prior to admission? : No Suicidal Intent: No Has patient had any suicidal intent within the past 6 months prior to  admission? : No Is patient at risk for suicide?: No Suicidal Plan?: No Has patient had any suicidal plan within the past 6 months prior to admission? : No Access to Means: No What has been your use of drugs/alcohol within the last 12 months?: NA Previous Attempts/Gestures: No How many times?: 0 Other Self Harm Risks: NA Triggers for Past Attempts: None known Intentional Self  Injurious Behavior: None Family Suicide History: No Recent stressful life event(s): Other (Comment) Persecutory voices/beliefs?: No Depression: No Depression Symptoms: (pt denies) Substance abuse history and/or treatment for substance abuse?: No Suicide prevention information given to non-admitted patients: Not applicable  Risk to Others within the past 6 months Homicidal Ideation: No Does patient have any lifetime risk of violence toward others beyond the six months prior to admission? : No Thoughts of Harm to Others: No Current Homicidal Intent: No Current Homicidal Plan: No Access to Homicidal Means: No Identified Victim: NA History of harm to others?: No Assessment of Violence: None Noted Violent Behavior Description: NA Does patient have access to weapons?: No Criminal Charges Pending?: No Does patient have a court date: No Is patient on probation?: No  Psychosis Hallucinations: (Pt denies but IVC reports AVH) Delusions: (Pt denies but IVC reports AVH)  Mental Status Report Appearance/Hygiene: In scrubs Eye Contact: Fair Motor Activity: Freedom of movement Speech: Logical/coherent Level of Consciousness: Alert Mood: Euthymic Affect: Appropriate to circumstance Anxiety Level: Minimal Thought Processes: Coherent, Relevant Judgement: Unimpaired Orientation: Person, Place, Time Obsessive Compulsive Thoughts/Behaviors: None  Cognitive Functioning Concentration: Normal Memory: Recent Intact, Remote Intact IQ: Average Insight: Poor Impulse Control: Poor Appetite: Fair Weight Loss: 0 Weight Gain: 0 Sleep: Decreased Total Hours of Sleep: 6 Vegetative Symptoms: None  ADLScreening [facility] Assessment Services) Patient's cognitive ability adequate to safely complete daily activities?: Yes Patient able to express need for assistance with ADLs?: Yes Independently performs ADLs?: Yes (appropriate for developmental age)  Prior Inpatient Therapy Prior Inpatient Therapy:  No Prior Therapy Dates: NA Prior Therapy Facilty/Provider(s): nA Reason for Treatment: Na  Prior Outpatient Therapy Prior Outpatient Therapy: No Prior Therapy Dates: NA Prior Therapy Facilty/Provider(s): nA Reason for Treatment: NA Does patient have an ACCT team?: No Does patient have Intensive In-House Services?  : No Does patient have [facility] services? : No Does patient have P4[facility] services?: No  ADL Screening (condition at time of admission) Patient's cognitive ability adequate to safely complete daily activities?: Yes Is the patient deaf or have difficulty hearing?: No Does the patient have difficulty seeing, even when wearing glasses/contacts?: No Does the patient have difficulty concentrating, remembering, or making decisions?: No Patient able to express need for assistance with ADLs?: Yes Does the patient have difficulty dressing or bathing?: No Independently performs ADLs?: Yes (appropriate for developmental age) Does the patient have difficulty walking or climbing stairs?: No Weakness of Legs: None Weakness of Arms/Hands: None       Abuse/Neglect Assessment (Assessment to be complete while patient is alone) Abuse/Neglect Assessment Can Be Completed: Yes Physical Abuse: Denies Verbal Abuse: Denies Sexual Abuse: Denies Exploitation of patient/patient's resources: Denies     Regulatory affairs officer (For Healthcare) Does Patient Have a Medical Advance Directive?: No Would patient like information on creating a medical advance directive?: No - Patient declined    Additional Information 1:1 In Past 12 Months?: No CIRT Risk: No Elopement Risk: No Does patient have medical clearance?: Yes     Disposition:  Disposition Initial Assessment Completed for this Encounter: Yes  This service was provided via telemedicine  using a 2-way, interactive audio and Radiographer, therapeutic.  Names of all persons participating in this telemedicine service and their role in this  encounter. Name: Lavell,[name]  Role: NP  Name: Role:    Role:   Name:  Role:     Cyndia Bent 02/21/2017 10:53 AM

## 2017-02-21 NOTE — ED Provider Notes (Addendum)
IVC rescinded.  Pt is ready to go home.  She will f/u with outpt resources.  No longer felt to be a threat to herself or others  Blanchie Dessert, MD 02/21/17 [id number]    Blanchie Dessert, MD 02/21/17 [phone]

## 2017-02-21 NOTE — Progress Notes (Signed)
Per Earleen Newport, NP, the patient does not meet criteria for inpatient treatment. The patient is recommended for SW Consult for assistance with ALF/SNF placement.   The patient is psychiatrically cleared at this time.   TTS contacted the patient's daughter, Overton Mam [phone]) and informed her that the patient was being psychiatrically cleared and  is recommended to seek options for assisted living or skilled nursing facilities.   [facility]ac[facility] ED CSW notified.   Norva Pavlov, RN notified.     Radonna Ricker MSW, [place] Disposition [phone]

## 2017-02-21 NOTE — Progress Notes (Signed)
CSW spoke with the pt while accompanying another CSW and confirmed pt had a ride home.  CSW was informed by pt's RN and by reviewing the notes that pt was psychiatrically cleared and IVC needs to be rescinded.  CSW faxed form, signed by the EDP, to rescind IVC to the magistrates office.    Please reconsult if future social work needs arise.  CSW signing off, as social work intervention is no longer needed.  Gina [name]. Gina Laperle, LCSW, LCAS, CSI Clinical Social Worker Ph: [phone]

## 2017-02-21 NOTE — ED Notes (Signed)
Patient provided with graham crackers and peanut butter per request for something to eat. Blood glucose currently 193. Advised patient of need to be careful with carbohydrate intake. Patient understanding. Also administered long acting insulin at this time.

## 2017-02-21 NOTE — ED Notes (Signed)
Pt CBG 191

## 2017-02-21 NOTE — Clinical Social Work Note (Signed)
Clinical Social Work Assessment  Patient Details  Name: Gina [name] MRN: [id number] Date of Birth: [birth year]  Date of referral:  02/21/17               Reason for consult:  Facility Placement                Permission sought to share information with:  Chartered certified accountant granted to share information::  Yes, Verbal Permission Granted  Name::        Agency::     Relationship::     Contact Information:     Housing/Transportation Living arrangements for the past 2 months:  [place], [place] of Information:  Patient Patient Interpreter Needed:  None Criminal Activity/Legal Involvement Pertinent to Current Situation/Hospitalization:    Significant Relationships:  Adult Children, Siblings, Friend, Placer Lives with:  Self Do you feel safe going back to the place where you live?  Yes Need for family participation in patient care:  Yes (Comment)  Care giving concerns:  CSW consulted for SNF or ALF placement via family's request.    Social Worker assessment / plan:  CSW consulted for SNF or ALF placement. CSW met pt via bedside. Pt informed CSW that she wants to go home. Pt does not want to go to an ALF or SNF placement. Pt stated she has multiple supports in the community and with family members. Pt states that she lives alone in a one bedroom apartment at [facility]. Pt stated that if she is to be discharged she will be able to find a ride home. Pt informed CSW that she is involved with her neighbors and attends church regularly. Pt stated she is excited to go home from the hospital.   Employment status:  Disabled (Comment on whether or not currently receiving Disability)(Not currently receiving) Insurance information:    PT Recommendations:  Not assessed at this time Information / Referral to community resources:     Patient/Family's Response to care:  Pt agreeable to plan of care.  Patient/Family's Understanding of  and Emotional Response to Diagnosis, Current Treatment, and Prognosis:  Pt did not express any questions or concerns to CSW at this time.   Emotional Assessment Appearance:  Appears stated age Attitude/Demeanor/Rapport:    Affect (typically observed):  Appropriate, Accepting, Hopeful, Happy Orientation:  Oriented to Self, Oriented to Situation, Oriented to Place, Oriented to  Time Alcohol / Substance use:    Psych involvement (Current and /or in the community):  Yes (Comment)  Discharge Needs  Concerns to be addressed:  No discharge needs identified Readmission within the last 30 days:  No Current discharge risk:  None Barriers to Discharge:  No Barriers Identified   Wendelyn Breslow, LCSW 02/21/2017, 5:54 PM

## 2017-02-21 NOTE — ED Notes (Signed)
Pt verbalized understanding of d/c instructions and has no further questions. VSS, NAD. Pt sent home and to follow up [facility] for prescriptions.

## 2017-02-21 NOTE — ED Notes (Signed)
BH called this RN and stated that pt is cleared by [facility], pt needs consult to SW to arrange skilled nursing facility for pt. [place] spoke with SW but stated that we probably need a consult.

## 2017-02-21 NOTE — ED Notes (Signed)
Case management in room. As long as pt is medically cleared pt can leave, pt has ride. CSW working on resending IVC paperwork.

## 2017-02-22 ENCOUNTER — Inpatient Hospital Stay (HOSPITAL_COMMUNITY)
Admission: EM | Admit: 2017-02-22 | Discharge: 2017-02-26 | DRG: 854 | Disposition: A | Discharge status: Home Health | Payer: Medicaid Other | Attending: Internal Medicine | Admitting: Internal Medicine

## 2017-02-22 ENCOUNTER — Emergency Department (HOSPITAL_COMMUNITY): Payer: Medicaid Other

## 2017-02-22 ENCOUNTER — Encounter (HOSPITAL_COMMUNITY): Payer: Self-pay | Admitting: Emergency Medicine

## 2017-02-22 DIAGNOSIS — Z833 Family history of diabetes mellitus: Secondary | ICD-10-CM

## 2017-02-22 DIAGNOSIS — D638 Anemia in other chronic diseases classified elsewhere: Secondary | ICD-10-CM | POA: Diagnosis present

## 2017-02-22 DIAGNOSIS — Z9119 Patient's noncompliance with other medical treatment and regimen: Secondary | ICD-10-CM | POA: Diagnosis not present

## 2017-02-22 DIAGNOSIS — F209 Schizophrenia, unspecified: Secondary | ICD-10-CM | POA: Diagnosis present

## 2017-02-22 DIAGNOSIS — Z794 Long term (current) use of insulin: Secondary | ICD-10-CM | POA: Diagnosis not present

## 2017-02-22 DIAGNOSIS — Z89432 Acquired absence of left foot: Secondary | ICD-10-CM

## 2017-02-22 DIAGNOSIS — A419 Sepsis, unspecified organism: Secondary | ICD-10-CM | POA: Diagnosis present

## 2017-02-22 DIAGNOSIS — E11621 Type 2 diabetes mellitus with foot ulcer: Secondary | ICD-10-CM | POA: Diagnosis present

## 2017-02-22 DIAGNOSIS — E1100 Type 2 diabetes mellitus with hyperosmolarity without nonketotic hyperglycemic-hyperosmolar coma (NKHHC): Secondary | ICD-10-CM | POA: Diagnosis present

## 2017-02-22 DIAGNOSIS — L97519 Non-pressure chronic ulcer of other part of right foot with unspecified severity: Secondary | ICD-10-CM | POA: Diagnosis present

## 2017-02-22 DIAGNOSIS — E1165 Type 2 diabetes mellitus with hyperglycemia: Secondary | ICD-10-CM | POA: Diagnosis present

## 2017-02-22 DIAGNOSIS — IMO0002 Reserved for concepts with insufficient information to code with codable children: Secondary | ICD-10-CM

## 2017-02-22 DIAGNOSIS — E1151 Type 2 diabetes mellitus with diabetic peripheral angiopathy without gangrene: Secondary | ICD-10-CM | POA: Diagnosis present

## 2017-02-22 DIAGNOSIS — M869 Osteomyelitis, unspecified: Secondary | ICD-10-CM | POA: Diagnosis present

## 2017-02-22 DIAGNOSIS — I10 Essential (primary) hypertension: Secondary | ICD-10-CM | POA: Diagnosis present

## 2017-02-22 DIAGNOSIS — Z8249 Family history of ischemic heart disease and other diseases of the circulatory system: Secondary | ICD-10-CM | POA: Diagnosis not present

## 2017-02-22 DIAGNOSIS — E1140 Type 2 diabetes mellitus with diabetic neuropathy, unspecified: Secondary | ICD-10-CM | POA: Diagnosis present

## 2017-02-22 DIAGNOSIS — F319 Bipolar disorder, unspecified: Secondary | ICD-10-CM | POA: Diagnosis present

## 2017-02-22 DIAGNOSIS — E1169 Type 2 diabetes mellitus with other specified complication: Secondary | ICD-10-CM | POA: Diagnosis present

## 2017-02-22 DIAGNOSIS — Z9114 Patient's other noncompliance with medication regimen: Secondary | ICD-10-CM

## 2017-02-22 DIAGNOSIS — R509 Fever, unspecified: Secondary | ICD-10-CM | POA: Diagnosis present

## 2017-02-22 HISTORY — DX: Osteomyelitis, unspecified: M86.9

## 2017-02-22 HISTORY — DX: Schizophrenia, unspecified: F20.9

## 2017-02-22 LAB — CBC WITH DIFFERENTIAL/PLATELET
Basophils Absolute: 0 10*3/uL (ref 0.0–0.1)
Basophils Relative: 0 %
Eosinophils Absolute: 0 10*3/uL (ref 0.0–0.7)
Eosinophils Relative: 0 %
HCT: 31.9 % — ABNORMAL LOW (ref 36.0–46.0)
Hemoglobin: 10.7 g/dL — ABNORMAL LOW (ref 12.0–15.0)
Lymphocytes Relative: 8 %
Lymphs Abs: 1 10*3/uL (ref 0.7–4.0)
MCH: 30.4 pg (ref 26.0–34.0)
MCHC: 33.5 g/dL (ref 30.0–36.0)
MCV: 90.6 fL (ref 78.0–100.0)
Monocytes Absolute: 1.4 10*3/uL — ABNORMAL HIGH (ref 0.1–1.0)
Monocytes Relative: 11 %
Neutro Abs: 10.8 10*3/uL — ABNORMAL HIGH (ref 1.7–7.7)
Neutrophils Relative %: 81 %
Platelets: 242 10*3/uL (ref 150–400)
RBC: 3.52 MIL/uL — ABNORMAL LOW (ref 3.87–5.11)
RDW: 12.6 % (ref 11.5–15.5)
WBC: 13.2 10*3/uL — ABNORMAL HIGH (ref 4.0–10.5)

## 2017-02-22 LAB — BASIC METABOLIC PANEL
Anion gap: 8 (ref 5–15)
BUN: 14 mg/dL (ref 6–20)
CO2: 25 mmol/L (ref 22–32)
Calcium: 8.7 mg/dL — ABNORMAL LOW (ref 8.9–10.3)
Chloride: 99 mmol/L — ABNORMAL LOW (ref 101–111)
Creatinine, Ser: 0.66 mg/dL (ref 0.44–1.00)
GFR calc Af Amer: >60 mL/min (ref >60)
GFR calc non Af Amer: >60 mL/min (ref >60)
Glucose, Bld: 439 mg/dL — ABNORMAL HIGH (ref 65–99)
Potassium: 4.8 mmol/L (ref 3.5–5.1)
Sodium: 132 mmol/L — ABNORMAL LOW (ref 135–145)

## 2017-02-22 LAB — I-STAT CG4 LACTIC ACID, ED: Lactic Acid, Venous: 0.93 mmol/L (ref 0.5–1.9)

## 2017-02-22 MED ORDER — POLYETHYLENE GLYCOL 3350 17 G PO PACK: 17.0000 g | PACK | Freq: Every day | ORAL | Status: DC | PRN

## 2017-02-22 MED ORDER — OXYCODONE-ACETAMINOPHEN 5-325 MG PO TABS
1.0000 | ORAL_TABLET | ORAL | Status: DC | PRN
  Administered 2017-02-23: 06:00:00 1 via ORAL
  Filled 2017-02-22 (×2): qty 1

## 2017-02-22 MED ORDER — HYDRALAZINE HCL 20 MG/ML IJ SOLN: 5.0000 mg | INTRAMUSCULAR | Status: DC | PRN

## 2017-02-22 MED ORDER — ESCITALOPRAM OXALATE 10 MG PO TABS
10.0000 mg | ORAL_TABLET | Freq: Every day | ORAL | Status: DC
  Administered 2017-02-23 – 2017-02-26 (×4): 10 mg via ORAL
  Filled 2017-02-22 (×4): qty 1

## 2017-02-22 MED ORDER — ACETAMINOPHEN 650 MG RE SUPP: 650.0000 mg | Freq: Four times a day (QID) | RECTAL | Status: DC | PRN

## 2017-02-22 MED ORDER — INSULIN ASPART 100 UNIT/ML ~~LOC~~ SOLN
0.0000 [IU] | Freq: Three times a day (TID) | SUBCUTANEOUS | Status: DC
  Administered 2017-02-23: 5 [IU] via SUBCUTANEOUS
  Administered 2017-02-23: 1 [IU] via SUBCUTANEOUS
  Administered 2017-02-24: 3 [IU] via SUBCUTANEOUS
  Administered 2017-02-24 – 2017-02-25 (×2): 5 [IU] via SUBCUTANEOUS
  Administered 2017-02-25: 3 [IU] via SUBCUTANEOUS
  Administered 2017-02-25 – 2017-02-26 (×2): 7 [IU] via SUBCUTANEOUS
  Administered 2017-02-26: 3 [IU] via SUBCUTANEOUS
  Administered 2017-02-26: 5 [IU] via SUBCUTANEOUS

## 2017-02-22 MED ORDER — SODIUM CHLORIDE 0.9 % IV SOLN
INTRAVENOUS | Status: DC
  Administered 2017-02-23: 06:00:00 via INTRAVENOUS

## 2017-02-22 MED ORDER — ZOLPIDEM TARTRATE 5 MG PO TABS: 5.0000 mg | ORAL_TABLET | Freq: Every evening | ORAL | Status: DC | PRN

## 2017-02-22 MED ORDER — ONDANSETRON HCL 4 MG PO TABS
4.0000 mg | ORAL_TABLET | Freq: Four times a day (QID) | ORAL | Status: DC | PRN
Start: 2017-02-22 — End: 2017-02-26

## 2017-02-22 MED ORDER — INSULIN GLARGINE 100 UNIT/ML SOLOSTAR PEN: 20.0000 [IU] | PEN_INJECTOR | Freq: Every day | SUBCUTANEOUS | Status: DC

## 2017-02-22 MED ORDER — ACETAMINOPHEN 325 MG PO TABS: 650.0000 mg | ORAL_TABLET | Freq: Four times a day (QID) | ORAL | Status: DC | PRN

## 2017-02-22 MED ORDER — SODIUM CHLORIDE 0.9 % IV BOLUS (SEPSIS)
1000.0000 mL | Freq: Once | INTRAVENOUS | Status: AC
  Administered 2017-02-22: 1000 mL via INTRAVENOUS

## 2017-02-22 MED ORDER — SODIUM CHLORIDE 0.9 % IV BOLUS (SEPSIS)
1500.0000 mL | Freq: Once | INTRAVENOUS | Status: AC
  Administered 2017-02-23: 1500 mL via INTRAVENOUS

## 2017-02-22 MED ORDER — PIPERACILLIN-TAZOBACTAM 3.375 G IVPB 30 MIN
3.3750 g | Freq: Once | INTRAVENOUS | Status: AC
Start: 2017-02-22 — End: 2017-02-22
  Administered 2017-02-22: 3.375 g via INTRAVENOUS
  Filled 2017-02-22: qty 50

## 2017-02-22 MED ORDER — ONDANSETRON HCL 4 MG/2ML IJ SOLN: 4.0000 mg | Freq: Four times a day (QID) | INTRAMUSCULAR | Status: DC | PRN

## 2017-02-22 MED ORDER — INSULIN GLARGINE 100 UNIT/ML ~~LOC~~ SOLN
20.0000 [IU] | Freq: Every day | SUBCUTANEOUS | Status: DC
  Administered 2017-02-23 – 2017-02-25 (×4): 20 [IU] via SUBCUTANEOUS
  Filled 2017-02-22 (×6): qty 0.2

## 2017-02-22 MED ORDER — VANCOMYCIN HCL IN DEXTROSE 1-5 GM/200ML-% IV SOLN
1000.0000 mg | Freq: Once | INTRAVENOUS | Status: AC
  Administered 2017-02-22: 1000 mg via INTRAVENOUS
  Filled 2017-02-22: qty 200

## 2017-02-22 MED ORDER — SODIUM CHLORIDE 0.9 % IV BOLUS (SEPSIS): 1000.0000 mL | Freq: Once | INTRAVENOUS | Status: DC

## 2017-02-22 NOTE — H&P (Signed)
History and Physical    Gina [name] ZYY:[id number] DOB: 02-04-1961 DOA: 02/22/2017  Referring MD/NP/PA:   PCP: Alfonse Spruce, FNP   Patient coming from:  The patient is coming from home.  At baseline, pt is partially dependent for most of ADL.   Chief Complaint: Right foot swelling, the pain, fever and chills.  HPI: Gina [name] is a 56 y.o. female with medical history significant of poorly controlled diabetes, hypertension, schizophrenia, anemia, s/p of left Transmetatarsal amputation, medication noncompliance,  who presents with right foot swelling, pain, fever and chills.  Pt is a very poor historian, history is limited. The history is obtained from pt and her sister , and also by discussing the case with ED physician, per EMS report, and with the nursing staff. It seems that pt has been have right foot pain and swelling for at least one week. She also has fever or chills. She has nausea, but denies vomiting, diarrhea or abdominal pain. She does not have chest pain, shortness rest, cough. She moves all extremities normally. Per her sistert, patient is likely not taking any medications currently. Patient denies suicidal or homicidal ideations.  ED Course: pt was found to have WBC 13.2,Lactic acid 0.93, electrolytes renal function okay, temperature 100, heart rate in 90s, respirations 20, oxygen saturation 97% on room air, soft blood pressure. Patient is admitted to [place] bed as inpatient.  X-ray of right foot showed 1. Osteomyelitis involving the distal phalanx of the third toe. 2. Extensive soft tissue gas throughout the third toe extending into the plantar soft tissues underlying the second and third toes, indicating a gas-forming organism.  Review of Systems:   General: has fevers, chills, no body weight gain, has poor appetite, has fatigue HEENT: no blurry vision, hearing changes or sore throat Respiratory: no dyspnea, coughing, wheezing CV: no chest pain, no  palpitations GI: no nausea, vomiting, abdominal pain, diarrhea, constipation GU: no dysuria, burning on urination, increased urinary frequency, hematuria  Ext: no leg edema Neuro: no unilateral weakness, numbness, or tingling, no vision change or hearing loss Skin: has right foot swelling and pain. No rashes. MSK: No muscle spasm, no deformity, no limitation of range of movement in spin Heme: No easy bruising.  Travel history: No recent long distant travel.  Allergy:  Allergies  Allergen Reactions  . Metformin And Related Other (See Comments)    Upset stomach    Past Medical History:  Diagnosis Date  . Diabetes mellitus   . Hypertension   . Schizophrenia [facility])     Past Surgical History:  Procedure Laterality Date  . AMPUTATION Left 06/05/2016   Procedure: LEFT FOOT TRANSMETATARSAL AMPUTATION;  Surgeon: [name], MD;  Location: [facility]S;  Service: Orthopedics;  Laterality: Left;  . TUBAL LIGATION      Social History:  reports that  has never smoked. she has never used smokeless tobacco. She reports that she does not drink alcohol or use drugs.  Family History:  Family History  Problem Relation Age of Onset  . Diabetes type II Father   . CAD Father   . Prostate cancer Father   . Diabetes Mellitus II Brother      Prior to Admission medications   Medication Sig Start Date End Date Taking? Authorizing Provider  acetaminophen (TYLENOL) 325 MG tablet Take 2 tablets (650 mg total) by mouth every 6 (six) hours as needed for mild pain (or Fever >/= 101). Patient not taking: Reported on 02/20/2017 06/07/16   [name],  MD  amLODipine (NORVASC) 10 MG tablet Take 1 tablet (10 mg total) by mouth daily. Patient not taking: Reported on 02/20/2017 06/23/16   Alfonse Spruce, FNP  bisacodyl (DULCOLAX) 10 MG suppository Place 1 suppository (10 mg total) rectally daily as needed for moderate constipation. Patient not taking: Reported on 02/20/2017 06/07/16   [name], MD    docusate sodium (COLACE) 100 MG capsule Take 1 capsule (100 mg total) by mouth 2 (two) times daily. Patient not taking: Reported on 02/20/2017 06/07/16   [name], MD  escitalopram (LEXAPRO) 10 MG tablet Take 1 tablet (10 mg total) by mouth daily. After 1 week may increase take 2 tablets (20 mg total) by mouth daily. Patient not taking: Reported on 02/20/2017 06/23/16   Alfonse Spruce, FNP  ferrous sulfate 325 (65 FE) MG tablet Take 1 tablet (325 mg total) by mouth 2 (two) times daily with a meal. Patient not taking: Reported on 02/20/2017 06/28/16   Alfonse Spruce, FNP  insulin aspart (NOVOLOG FLEXPEN) 100 UNIT/ML FlexPen Inject 2-15 Units into the skin 3 (three) times daily with meals. Take 3 U Novolog with each meal and add on more units based on you blood sugars   CBG 70 - 120: 0 units  CBG 121 - 150: 2 units  CBG 151 - 200: 3 units  CBG 201 - 250: 5 units  CBG 251 - 300: 8 units  CBG 301 - 350: 11 units  CBG 351 - 400: 15 units Patient not taking: Reported on 02/20/2017 06/23/16   Alfonse Spruce, FNP  insulin aspart (NOVOLOG) 100 UNIT/ML injection Inject 0-15 Units into the skin 3 (three) times daily with meals. Patient not taking: Reported on 02/20/2017 06/07/16   [name], MD  Insulin Glargine (LANTUS) 100 UNIT/ML Solostar Pen Inject 28 Units into the skin daily at 10 pm. Patient not taking: Reported on 02/20/2017 06/23/16   Alfonse Spruce, FNP  lisinopril (PRINIVIL,ZESTRIL) 2.5 MG tablet Take 1 tablet (2.5 mg total) by mouth daily. Patient not taking: Reported on 02/20/2017 06/07/16   [name], MD  metFORMIN (GLUCOPHAGE-XR) 500 MG 24 hr tablet Take 2 tablets (1,000 mg total) by mouth 2 (two) times daily. Patient not taking: Reported on 02/20/2017 04/28/16   Alfonse Spruce, FNP  oxyCODONE (OXY IR/ROXICODONE) 5 MG immediate release tablet Take 1-2 tablets (5-10 mg total) by mouth every 4 (four) hours as needed for breakthrough pain. Patient not  taking: Reported on 02/20/2017 06/07/16   [name], MD  polyethylene glycol [facility]) packet Take 17 g by mouth daily as needed for mild constipation. Patient not taking: Reported on 02/20/2017 06/07/16   [name], MD  senna-docusate (SENOKOT-S) 8.6-50 MG tablet Take 2 tablets by mouth daily. Patient not taking: Reported on 02/20/2017 06/07/16   [name], MD    Physical Exam: Vitals:   02/22/17 1820 02/22/17 2000 02/22/17 2221  BP: 138/81 (!) 151/85 128/76  Pulse: 82 93 93  Resp: _0 Temp: 100 F (37.8 C)    TempSrc: Oral    SpO2: 99% 96% 95%   General: Not in acute distress HEENT:       Eyes: PERRL, EOMI, no scleral icterus.       ENT: No discharge from the ears and nose, no pharynx injection, no tonsillar enlargement.        Neck: No JVD, no bruit, no mass felt. Heme: No neck lymph node enlargement. Cardiac: S1/S2, RRR, No murmurs, No gallops  or rubs. Respiratory: Good air movement bilaterally. No rales, wheezing, rhonchi or rubs. GI: Soft, nondistended, nontender, no rebound pain, no organomegaly, BS present. GU: No hematuria Ext: No pitting leg edema bilaterally. 1+DP/PT pulse bilaterally. S/p of transmetatarsal amputation L foot. R foot is diffusely swelling, with erythema, tenderness and warmth. Musculoskeletal: No joint deformities, No joint redness or warmth, no limitation of ROM in spin. Skin: No rashes.  Neuro: Alert, oriented X3, cranial nerves II-XII grossly intact, moves all extremities normally. sych:  no suicidal or hemocidal ideation.  Labs on Admission: I have personally reviewed following labs and imaging studies  CBC: Recent Labs  Lab 02/20/17 1926 02/22/17 2128  WBC 10.3 13.2*  NEUTROABS  --  10.8*  HGB 11.4* 10.7*  HCT 34.2* 31.9*  MCV 92.4 90.6  PLT 230 161   Basic Metabolic Panel: Recent Labs  Lab 02/20/17 1926 02/22/17 2128  NA 125* 132*  K 3.9 4.8  CL 90* 99*  CO2 26 25  GLUCOSE 787* 439*  BUN 9 14  CREATININE 0.73  0.66  CALCIUM 9.2 8.7*   GFR: CrCl cannot be calculated (Unknown ideal weight.). Liver Function Tests: Recent Labs  Lab 02/20/17 1926  AST 27  ALT 30  ALKPHOS 103  BILITOT 0.5  PROT 7.6  ALBUMIN 3.4*   No results for input(s): LIPASE, AMYLASE in the last 168 hours. No results for input(s): AMMONIA in the last 168 hours. Coagulation Profile: No results for input(s): INR, PROTIME in the last 168 hours. Cardiac Enzymes: No results for input(s): CKTOTAL, CKMB, CKMBINDEX, TROPONINI in the last 168 hours. BNP (last 3 results) No results for input(s): PROBNP in the last 8760 hours. HbA1C: No results for input(s): HGBA1C in the last 72 hours. CBG: Recent Labs  Lab 02/20/17 2208 02/21/17 0404 02/21/17 0835 02/21/17 1204 02/21/17 1705  GLUCAP 442* 193* 248* 202* 191*   Lipid Profile: No results for input(s): CHOL, HDL, LDLCALC, TRIG, CHOLHDL, LDLDIRECT in the last 72 hours. Thyroid Function Tests: No results for input(s): TSH, T4TOTAL, FREET4, T3FREE, THYROIDAB in the last 72 hours. Anemia Panel: No results for input(s): VITAMINB12, FOLATE, FERRITIN, TIBC, IRON, RETICCTPCT in the last 72 hours. Urine analysis:    Component Value Date/Time   COLORURINE STRAW (A) 02/20/2017 2359   APPEARANCEUR CLEAR 02/20/2017 2359   LABSPEC 1.030 02/20/2017 2359   PHURINE 6.0 02/20/2017 2359   GLUCOSEU >=500 (A) 02/20/2017 2359   HGBUR SMALL (A) 02/20/2017 2359   BILIRUBINUR NEGATIVE 02/20/2017 2359   BILIRUBINUR N 04/28/2016 1603   KETONESUR NEGATIVE 02/20/2017 2359   PROTEINUR NEGATIVE 02/20/2017 2359   UROBILINOGEN 0.2 04/28/2016 1603   UROBILINOGEN 0.2 06/18/2013 0650   NITRITE NEGATIVE 02/20/2017 2359   LEUKOCYTESUR NEGATIVE 02/20/2017 2359   Sepsis Labs: _0 (procalcitonin:4,lacticidven:4) )No results found for this or any previous visit (from the past 240 hour(s)).   Radiological Exams on Admission: Dg Foot 2 Views Right  Result Date: 02/22/2017 CLINICAL DATA:   56 year old with a diabetic right foot infection. EXAM: RIGHT FOOT - 2 VIEW COMPARISON:  None. FINDINGS: Diffuse soft tissue swelling. Extensive soft tissue gas throughout the third toe extending into the plantar soft tissues underlying the second and third toes. Osteolysis involving the distal tuft of the distal phalanx. No evidence of acute or subacute fracture or dislocation. Joint spaces relatively well-preserved throughout. Small plantar calcaneal spur. IMPRESSION: 1. Osteomyelitis involving the distal phalanx of the third toe. 2. Extensive soft tissue gas throughout the third toe extending into the plantar soft  tissues underlying the second and third toes, indicating a gas-forming organism. Electronically Signed   By: Evangeline Dakin M.D.   On: 02/22/2017 21:59     EKG: INot done in ED, will get one.   Assessment/Plan Principal Problem:   Osteomyelitis of right foot (HCC) Active Problems:   HTN (hypertension)   DM (diabetes mellitus), type 2, uncontrolled, periph vascular complic (HCC)   Anemia of chronic disease   S/P transmetatarsal amputation of foot, left (HCC)   Sepsis (HCC)   Schizophrenia (HCC)   Osteomyelitis of right foot ([place]) and sepsis: Patient admits [facility] for sepsis with leukocytosis and fever. Blood pressure is soft, but hemodynamic is stable.  - will admit to Med-surg bed as inpt - Empiric antimicrobial treatment with vancomycin and Zosyn per pharmacy - PRN Zofran for nausea, and Percocet for pain - Blood cultures x 2  - ESR and CRP - will get Procalcitonin and trend lactic acid levels per sepsis protocol. - IVF: 2.5 L of NS bolus in ED, followed by 100 cc/h - INR/PTT/type & screen - ABI - Please consult ortho in AM  HTN: Blood pressure Is soft, patient is not taking medications. -Hold home lisinopril and amlodipine -IVF hydralazine when necessary  DM (diabetes mellitus), type 2, uncontrolled, periph vascular complic ([place]): Last C5P 97.8 on 06/01/16,  poorly controled. Patient was on Metformin, NovoLog and Lantus, but currently is not taking medications. Blood sugar 439. Anion gap normal. -Lantus 20 units daily (was on 28 units before) -SSI -Check A1c  Anemia of chronic disease: hemoglobin stable, 10.7 -Follow-up by CBC  Schizophrenia ([place]): was on Lexapro, but not taking medications currently. Denies suicidal or homicidal ideations. -Resume Lexapro  DVT ppx: SCD Code Status: Full code Family Communication:   Yes, patient's sister at bed side Disposition Plan:  Anticipate discharge back to previous home environment Consults called:  none Admission status:   medical floor/inpt  Date of Service 02/23/2017    [name] [facility] Pager [phone]  If 7PM-7AM, please contact night-coverage [email or web address] Password [email or web address] 02/23/2017, 12:01 AM

## 2017-02-22 NOTE — ED Notes (Signed)
Writer went to obtain labs but RN stated she would start an IV and obtain labs at that time.

## 2017-02-22 NOTE — ED Triage Notes (Signed)
Patient c/o right foot swollen and possibly infected, where might have to loose her toe.  Patient reports that she was seen at [facility] ED yesterday and dont know why they didn't take care of her foot then. Patient reports that she was given about 9 different medications yesterday and made her to where she couldn't hold her head up today.

## 2017-02-22 NOTE — ED Provider Notes (Signed)
[facility] DEPT Provider Note   CSN: [id number] Arrival date & time: 02/22/17  1810     History   Chief Complaint Chief Complaint  Patient presents with  . Foot Swelling    right    HPI Gina [name] is a 56 y.o. female.  HPI   56yF with infected foot. Likely osteomyelitis. Pt not very good historian. " I don't want to talk." Seen at [facility] ED yesterday for psych reasons and noted to have significant hyperglycemia. Foot not mentioned. I assume she didn't tell anyone. Her sister is at bedside and says she doesn't think she would have told anyone. She didn't tell family either. Her sister noticed it herself and brought her in. Says she is non-compliant with medications.   Past Medical History:  Diagnosis Date  . Diabetes mellitus   . Hypertension     Patient Active Problem List   Diagnosis Date Noted  . S/P transmetatarsal amputation of foot, left ([place]) 06/15/2016  . DM (diabetes mellitus), type 2, uncontrolled, periph vascular complic ([place]) 66/44/0347  . Anemia of chronic disease 06/07/2016  . Cellulitis of left foot   . Protein-calorie malnutrition, severe 06/03/2016  . Osteomyelitis due to type 2 diabetes mellitus ([place]) 06/02/2016  . Septic arthritis of interphalangeal joint of toe of left foot ([place]) 06/02/2016  . Colon cancer screening 02/17/2014  . Breast cancer screening 02/17/2014  . Diabetes ([place]) 07/28/2013  . Cholelithiases 07/28/2013  . DKA (diabetic ketoacidoses) ([place]) 06/18/2013  . HTN (hypertension) 06/18/2013  . Cellulitis of female genitalia 08/03/2012  . Hyperglycemia 07/31/2012  . Candidal skin infection 07/31/2012  . Cellulitis and abscess 07/31/2012    Past Surgical History:  Procedure Laterality Date  . AMPUTATION Left 06/05/2016   Procedure: LEFT FOOT TRANSMETATARSAL AMPUTATION;  Surgeon: [name], MD;  Location: [facility] ORS;  Service: Orthopedics;  Laterality: Left;  . TUBAL LIGATION      OB History    No  data available       Home Medications    Prior to Admission medications   Medication Sig Start Date End Date Taking? Authorizing Provider  acetaminophen (TYLENOL) 325 MG tablet Take 2 tablets (650 mg total) by mouth every 6 (six) hours as needed for mild pain (or Fever >/= 101). Patient not taking: Reported on 02/20/2017 06/07/16   [name], MD  amLODipine (NORVASC) 10 MG tablet Take 1 tablet (10 mg total) by mouth daily. Patient not taking: Reported on 02/20/2017 06/23/16   Alfonse Spruce, FNP  bisacodyl (DULCOLAX) 10 MG suppository Place 1 suppository (10 mg total) rectally daily as needed for moderate constipation. Patient not taking: Reported on 02/20/2017 06/07/16   [name], MD  docusate sodium (COLACE) 100 MG capsule Take 1 capsule (100 mg total) by mouth 2 (two) times daily. Patient not taking: Reported on 02/20/2017 06/07/16   [name], MD  escitalopram (LEXAPRO) 10 MG tablet Take 1 tablet (10 mg total) by mouth daily. After 1 week may increase take 2 tablets (20 mg total) by mouth daily. Patient not taking: Reported on 02/20/2017 06/23/16   Alfonse Spruce, FNP  ferrous sulfate 325 (65 FE) MG tablet Take 1 tablet (325 mg total) by mouth 2 (two) times daily with a meal. Patient not taking: Reported on 02/20/2017 06/28/16   Alfonse Spruce, FNP  insulin aspart (NOVOLOG FLEXPEN) 100 UNIT/ML FlexPen Inject 2-15 Units into the skin 3 (three) times daily with meals. Take 3 U Novolog with each meal  and add on more units based on you blood sugars   CBG 70 - 120: 0 units  CBG 121 - 150: 2 units  CBG 151 - 200: 3 units  CBG 201 - 250: 5 units  CBG 251 - 300: 8 units  CBG 301 - 350: 11 units  CBG 351 - 400: 15 units Patient not taking: Reported on 02/20/2017 06/23/16   Alfonse Spruce, FNP  insulin aspart (NOVOLOG) 100 UNIT/ML injection Inject 0-15 Units into the skin 3 (three) times daily with meals. Patient not taking: Reported on 02/20/2017 06/07/16    [name], MD  Insulin Glargine (LANTUS) 100 UNIT/ML Solostar Pen Inject 28 Units into the skin daily at 10 pm. Patient not taking: Reported on 02/20/2017 06/23/16   Alfonse Spruce, FNP  lisinopril (PRINIVIL,ZESTRIL) 2.5 MG tablet Take 1 tablet (2.5 mg total) by mouth daily. Patient not taking: Reported on 02/20/2017 06/07/16   [name], MD  metFORMIN (GLUCOPHAGE-XR) 500 MG 24 hr tablet Take 2 tablets (1,000 mg total) by mouth 2 (two) times daily. Patient not taking: Reported on 02/20/2017 04/28/16   Alfonse Spruce, FNP  oxyCODONE (OXY IR/ROXICODONE) 5 MG immediate release tablet Take 1-2 tablets (5-10 mg total) by mouth every 4 (four) hours as needed for breakthrough pain. Patient not taking: Reported on 02/20/2017 06/07/16   [name], MD  polyethylene glycol [facility]) packet Take 17 g by mouth daily as needed for mild constipation. Patient not taking: Reported on 02/20/2017 06/07/16   [name], MD  senna-docusate (SENOKOT-S) 8.6-50 MG tablet Take 2 tablets by mouth daily. Patient not taking: Reported on 02/20/2017 06/07/16   [name], MD    Family History Family History  Problem Relation Age of Onset  . Diabetes type II Father   . CAD Father   . Prostate cancer Father   . Diabetes Mellitus II Brother     Social History Social History   Tobacco Use  . Smoking status: Never Smoker  . Smokeless tobacco: Never Used  Substance Use Topics  . Alcohol use: No    Comment: occasionally  . Drug use: No     Allergies   Metformin and related   Review of Systems Review of Systems  All systems reviewed and negative, other than as noted in HPI.  Physical Exam Updated Vital Signs BP (!) 151/85   Pulse 93   Temp 100 F (37.8 C) (Oral)   Resp 20   SpO2 96%   Physical Exam  Constitutional: She appears well-developed and well-nourished. No distress.  HENT:  Head: Normocephalic and atraumatic.  Eyes: Conjunctivae are normal. Right eye exhibits no  discharge. Left eye exhibits no discharge.  Neck: Neck supple.  Cardiovascular: Normal rate, regular rhythm and normal heart sounds. Exam reveals no gallop and no friction rub.  No murmur heard. Pulmonary/Chest: Effort normal and breath sounds normal. No respiratory distress.  Abdominal: Soft. She exhibits no distension. There is no tenderness.  Musculoskeletal: She exhibits no edema or tenderness.  Trans metatarsal amputation L foot. Flaccid blister proximal foot w/o signs of cellulitis. R foot diffusely swelling past ankle. Clinically osteomyelitis of middle toe.   Neurological: She is alert.  Skin: Skin is warm and dry.  Psychiatric: She has a normal mood and affect. Her behavior is normal. Thought content normal.  Nursing note and vitals reviewed.    ED Treatments / Results  Labs (all labs ordered are listed, but only abnormal results are displayed) Labs Reviewed  BASIC  METABOLIC PANEL - Abnormal; Notable for the following components:      Result Value   Sodium 132 (*)    Chloride 99 (*)    Glucose, Bld 439 (*)    Calcium 8.7 (*)    All other components within normal limits  CBC WITH DIFFERENTIAL/PLATELET - Abnormal; Notable for the following components:   WBC 13.2 (*)    RBC 3.52 (*)    Hemoglobin 10.7 (*)    HCT 31.9 (*)    Neutro Abs 10.8 (*)    Monocytes Absolute 1.4 (*)    All other components within normal limits  I-STAT CG4 LACTIC ACID, ED    EKG  EKG Interpretation None       Radiology Dg Foot 2 Views Right  Result Date: 02/22/2017 CLINICAL DATA:  56 year old with a diabetic right foot infection. EXAM: RIGHT FOOT - 2 VIEW COMPARISON:  None. FINDINGS: Diffuse soft tissue swelling. Extensive soft tissue gas throughout the third toe extending into the plantar soft tissues underlying the second and third toes. Osteolysis involving the distal tuft of the distal phalanx. No evidence of acute or subacute fracture or dislocation. Joint spaces relatively  well-preserved throughout. Small plantar calcaneal spur. IMPRESSION: 1. Osteomyelitis involving the distal phalanx of the third toe. 2. Extensive soft tissue gas throughout the third toe extending into the plantar soft tissues underlying the second and third toes, indicating a gas-forming organism. Electronically Signed   By: Evangeline Dakin M.D.   On: 02/22/2017 21:59    Procedures Procedures (including critical care time)  Medications Ordered in ED Medications  vancomycin (VANCOCIN) IVPB 1000 mg/200 mL premix (not administered)  piperacillin-tazobactam (ZOSYN) IVPB 3.375 g (not administered)  sodium chloride 0.9 % bolus 1,000 mL (not administered)     Initial Impression / Assessment and Plan / ED Course  I have reviewed the triage vital signs and the nursing notes.  Pertinent labs & imaging results that were available during my care of the patient were reviewed by me and considered in my medical decision making (see chart for details).     Osteomyelitis of toes right foot with ascending cellulitis.  Noncompliant with medications.  Antibiotics.  Admission.  Final Clinical Impressions(s) / ED Diagnoses   Final diagnoses:  Osteomyelitis of toe [facility])    ED Discharge Orders    None       Virgel Manifold, MD 03/09/17 [phone]

## 2017-02-22 NOTE — ED Notes (Addendum)
Pt reports no injury or trauma to the right foot. She woke up over the weekend and it was swollen and has gotten progressively worse. Sanguineous drainage noted on bandage. Foul odor present on limb.

## 2017-02-23 ENCOUNTER — Encounter (HOSPITAL_COMMUNITY): Payer: Self-pay | Admitting: *Deleted

## 2017-02-23 ENCOUNTER — Inpatient Hospital Stay (HOSPITAL_COMMUNITY): Payer: Medicaid Other | Admitting: Anesthesiology

## 2017-02-23 ENCOUNTER — Other Ambulatory Visit: Payer: Self-pay

## 2017-02-23 ENCOUNTER — Encounter (HOSPITAL_COMMUNITY)
Admission: EM | Disposition: A | Discharge status: Home Health | Payer: Self-pay | Source: Home / Self Care | Attending: Internal Medicine

## 2017-02-23 ENCOUNTER — Encounter (HOSPITAL_COMMUNITY): Payer: Self-pay

## 2017-02-23 DIAGNOSIS — M869 Osteomyelitis, unspecified: Secondary | ICD-10-CM

## 2017-02-23 HISTORY — PX: AMPUTATION TOE: SHX6595

## 2017-02-23 LAB — BASIC METABOLIC PANEL
Anion gap: 7 (ref 5–15)
BUN: 11 mg/dL (ref 6–20)
CHLORIDE: 105 mmol/L (ref 101–111)
CO2: 23 mmol/L (ref 22–32)
CREATININE: 0.43 mg/dL — AB (ref 0.44–1.00)
Calcium: 8.2 mg/dL — ABNORMAL LOW (ref 8.9–10.3)
GFR calc Af Amer: >60 mL/min (ref >60)
GFR calc non Af Amer: >60 mL/min (ref >60)
Glucose, Bld: 301 mg/dL — ABNORMAL HIGH (ref 65–99)
POTASSIUM: 3.8 mmol/L (ref 3.5–5.1)
Sodium: 135 mmol/L (ref 135–145)

## 2017-02-23 LAB — GLUCOSE, CAPILLARY
Glucose-Capillary: 269 mg/dL — ABNORMAL HIGH (ref 65–99)
Glucose-Capillary: 121 mg/dL — ABNORMAL HIGH (ref 65–99)
Glucose-Capillary: 102 mg/dL — ABNORMAL HIGH (ref 65–99)
Glucose-Capillary: 90 mg/dL (ref 65–99)
Glucose-Capillary: 110 mg/dL — ABNORMAL HIGH (ref 65–99)

## 2017-02-23 LAB — PROTIME-INR
INR: 1.02
Prothrombin Time: 13.3 seconds (ref 11.4–15.2)

## 2017-02-23 LAB — CBC
HEMATOCRIT: 28.9 % — AB (ref 36.0–46.0)
Hemoglobin: 9.5 g/dL — ABNORMAL LOW (ref 12.0–15.0)
MCH: 30.2 pg (ref 26.0–34.0)
MCHC: 32.9 g/dL (ref 30.0–36.0)
MCV: 91.7 fL (ref 78.0–100.0)
PLATELETS: 179 10*3/uL (ref 150–400)
RBC: 3.15 MIL/uL — ABNORMAL LOW (ref 3.87–5.11)
RDW: 12.7 % (ref 11.5–15.5)
WBC: 11.9 10*3/uL — ABNORMAL HIGH (ref 4.0–10.5)

## 2017-02-23 LAB — APTT: APTT: 30 s (ref 24–36)

## 2017-02-23 LAB — SEDIMENTATION RATE: SED RATE: 105 mm/h — AB (ref 0–22)

## 2017-02-23 LAB — PREALBUMIN: Prealbumin: 5.1 mg/dL — ABNORMAL LOW (ref 18–38)

## 2017-02-23 LAB — PROCALCITONIN: Procalcitonin: 0.15 ng/mL

## 2017-02-23 LAB — HIV ANTIBODY (ROUTINE TESTING W REFLEX): HIV Screen 4th Generation wRfx: NONREACTIVE

## 2017-02-23 LAB — LACTIC ACID, PLASMA
LACTIC ACID, VENOUS: 0.7 mmol/L (ref 0.5–1.9)
Lactic Acid, Venous: 0.6 mmol/L (ref 0.5–1.9)

## 2017-02-23 LAB — C-REACTIVE PROTEIN: CRP: 7.7 mg/dL — AB (ref <1.0)

## 2017-02-23 SURGERY — AMPUTATION, TOE
Anesthesia: General | Site: Toe | Laterality: Right

## 2017-02-23 MED ORDER — GLUCERNA SHAKE PO LIQD
237.0000 mL | Freq: Two times a day (BID) | ORAL | Status: DC
  Administered 2017-02-24 – 2017-02-26 (×6): 237 mL via ORAL

## 2017-02-23 MED ORDER — PHENYLEPHRINE HCL 10 MG/ML IJ SOLN
INTRAVENOUS | Status: DC | PRN
  Administered 2017-02-23: 20 ug/min via INTRAVENOUS

## 2017-02-23 MED ORDER — METOCLOPRAMIDE HCL 5 MG/ML IJ SOLN
5.0000 mg | Freq: Three times a day (TID) | INTRAMUSCULAR | Status: DC | PRN
Start: 2017-02-23 — End: 2017-02-26

## 2017-02-23 MED ORDER — FENTANYL CITRATE (PF) 250 MCG/5ML IJ SOLN
INTRAMUSCULAR | Status: AC
  Filled 2017-02-23: qty 5

## 2017-02-23 MED ORDER — DOCUSATE SODIUM 100 MG PO CAPS
100.0000 mg | ORAL_CAPSULE | Freq: Two times a day (BID) | ORAL | Status: DC
  Administered 2017-02-23 – 2017-02-26 (×5): 100 mg via ORAL
  Filled 2017-02-23 (×6): qty 1

## 2017-02-23 MED ORDER — ONDANSETRON HCL 4 MG PO TABS: 4.0000 mg | ORAL_TABLET | Freq: Four times a day (QID) | ORAL | Status: DC | PRN

## 2017-02-23 MED ORDER — BISACODYL 10 MG RE SUPP: 10.0000 mg | Freq: Every day | RECTAL | Status: DC | PRN

## 2017-02-23 MED ORDER — SODIUM CHLORIDE 0.9 % IV SOLN: INTRAVENOUS | Status: DC

## 2017-02-23 MED ORDER — PROPOFOL 10 MG/ML IV BOLUS
INTRAVENOUS | Status: DC | PRN
  Administered 2017-02-23: 100 mg via INTRAVENOUS

## 2017-02-23 MED ORDER — 0.9 % SODIUM CHLORIDE (POUR BTL) OPTIME
TOPICAL | Status: DC | PRN
  Administered 2017-02-23: 1000 mL

## 2017-02-23 MED ORDER — LACTATED RINGERS IV SOLN
INTRAVENOUS | Status: DC
  Administered 2017-02-23 (×2): via INTRAVENOUS

## 2017-02-23 MED ORDER — METHOCARBAMOL 500 MG PO TABS
500.0000 mg | ORAL_TABLET | Freq: Four times a day (QID) | ORAL | Status: DC | PRN
  Administered 2017-02-23 – 2017-02-26 (×3): 500 mg via ORAL
  Filled 2017-02-23 (×3): qty 1

## 2017-02-23 MED ORDER — ACETAMINOPHEN 650 MG RE SUPP: 650.0000 mg | RECTAL | Status: DC | PRN

## 2017-02-23 MED ORDER — MIDAZOLAM HCL 2 MG/2ML IJ SOLN
INTRAMUSCULAR | Status: AC
  Filled 2017-02-23: qty 2

## 2017-02-23 MED ORDER — ONDANSETRON HCL 4 MG/2ML IJ SOLN
INTRAMUSCULAR | Status: DC | PRN
  Administered 2017-02-23: 4 mg via INTRAVENOUS

## 2017-02-23 MED ORDER — ONDANSETRON HCL 4 MG/2ML IJ SOLN
INTRAMUSCULAR | Status: AC
  Filled 2017-02-23: qty 2

## 2017-02-23 MED ORDER — METOCLOPRAMIDE HCL 5 MG PO TABS
5.0000 mg | ORAL_TABLET | Freq: Three times a day (TID) | ORAL | Status: DC | PRN
Start: 2017-02-23 — End: 2017-02-26

## 2017-02-23 MED ORDER — FENTANYL CITRATE (PF) 100 MCG/2ML IJ SOLN
INTRAMUSCULAR | Status: DC | PRN
  Administered 2017-02-23: 50 ug via INTRAVENOUS

## 2017-02-23 MED ORDER — PHENYLEPHRINE HCL 10 MG/ML IJ SOLN
INTRAMUSCULAR | Status: AC
  Filled 2017-02-23: qty 1

## 2017-02-23 MED ORDER — VANCOMYCIN HCL IN DEXTROSE 1-5 GM/200ML-% IV SOLN
1000.0000 mg | INTRAVENOUS | Status: DC
  Administered 2017-02-23 – 2017-02-24 (×2): 1000 mg via INTRAVENOUS
  Filled 2017-02-23 (×4): qty 200

## 2017-02-23 MED ORDER — POLYETHYLENE GLYCOL 3350 17 G PO PACK
17.0000 g | PACK | Freq: Every day | ORAL | Status: DC | PRN
Start: 2017-02-23 — End: 2017-02-23

## 2017-02-23 MED ORDER — SALINE SPRAY 0.65 % NA SOLN
1.0000 | Freq: Once | NASAL | Status: AC
  Administered 2017-02-23: 1 via NASAL
  Filled 2017-02-23: qty 44

## 2017-02-23 MED ORDER — LIDOCAINE HCL (CARDIAC) 20 MG/ML IV SOLN
INTRAVENOUS | Status: DC | PRN
Start: 2017-02-23 — End: 2017-02-23
  Administered 2017-02-23: 100 mg via INTRAVENOUS

## 2017-02-23 MED ORDER — MAGNESIUM CITRATE PO SOLN: 1.0000 | Freq: Once | ORAL | Status: DC | PRN

## 2017-02-23 MED ORDER — PIPERACILLIN-TAZOBACTAM 3.375 G IVPB
3.3750 g | Freq: Three times a day (TID) | INTRAVENOUS | Status: DC
  Administered 2017-02-23 – 2017-02-26 (×8): 3.375 g via INTRAVENOUS
  Filled 2017-02-23 (×14): qty 50

## 2017-02-23 MED ORDER — ACETAMINOPHEN 325 MG PO TABS: 650.0000 mg | ORAL_TABLET | ORAL | Status: DC | PRN

## 2017-02-23 MED ORDER — PROPOFOL 10 MG/ML IV BOLUS
INTRAVENOUS | Status: AC
  Filled 2017-02-23: qty 20

## 2017-02-23 MED ORDER — OXYCODONE HCL 5 MG PO TABS
5.0000 mg | ORAL_TABLET | ORAL | Status: DC | PRN
  Administered 2017-02-23 – 2017-02-26 (×7): 5 mg via ORAL
  Filled 2017-02-23 (×7): qty 1

## 2017-02-23 MED ORDER — ONDANSETRON HCL 4 MG/2ML IJ SOLN: 4.0000 mg | Freq: Four times a day (QID) | INTRAMUSCULAR | Status: DC | PRN

## 2017-02-23 MED ORDER — DEXTROSE 5 % IV SOLN
500.0000 mg | Freq: Four times a day (QID) | INTRAVENOUS | Status: DC | PRN
  Filled 2017-02-23: qty 5

## 2017-02-23 MED ORDER — LIVING WELL WITH DIABETES BOOK
Freq: Once | Status: AC
  Administered 2017-02-23: 13:00:00
  Filled 2017-02-23: qty 1

## 2017-02-23 SURGICAL SUPPLY — 29 items
BLADE SURG 21 STRL SS (BLADE) ×3 IMPLANT
BNDG COHESIVE 4X5 TAN STRL (GAUZE/BANDAGES/DRESSINGS) ×3 IMPLANT
BNDG COHESIVE 6X5 TAN STRL LF (GAUZE/BANDAGES/DRESSINGS) ×3 IMPLANT
BNDG ESMARK 4X9 LF (GAUZE/BANDAGES/DRESSINGS) IMPLANT
BNDG GAUZE ELAST 4 BULKY (GAUZE/BANDAGES/DRESSINGS) ×3 IMPLANT
COVER SURGICAL LIGHT HANDLE (MISCELLANEOUS) ×6 IMPLANT
DRAPE U-SHAPE 47X51 STRL (DRAPES) ×3 IMPLANT
DRSG ADAPTIC 3X8 NADH LF (GAUZE/BANDAGES/DRESSINGS) ×3 IMPLANT
DRSG PAD ABDOMINAL 8X10 ST (GAUZE/BANDAGES/DRESSINGS) ×3 IMPLANT
DURAPREP 26ML APPLICATOR (WOUND CARE) ×3 IMPLANT
ELECT REM PT RETURN 9FT ADLT (ELECTROSURGICAL) ×3
ELECTRODE REM PT RTRN 9FT ADLT (ELECTROSURGICAL) ×1 IMPLANT
GAUZE SPONGE 4X4 12PLY STRL (GAUZE/BANDAGES/DRESSINGS) ×3 IMPLANT
GLOVE BIOGEL PI IND STRL 9 (GLOVE) ×1 IMPLANT
GLOVE BIOGEL PI INDICATOR 9 (GLOVE) ×2
GLOVE SURG ORTHO 9.0 STRL STRW (GLOVE) ×3 IMPLANT
GOWN STRL REUS W/ TWL XL LVL3 (GOWN DISPOSABLE) ×2 IMPLANT
GOWN STRL REUS W/TWL XL LVL3 (GOWN DISPOSABLE) ×4
KIT BASIN OR (CUSTOM PROCEDURE TRAY) ×3 IMPLANT
KIT ROOM TURNOVER OR (KITS) ×3 IMPLANT
MANIFOLD NEPTUNE II (INSTRUMENTS) ×3 IMPLANT
NEEDLE 22X1 1/2 (OR ONLY) (NEEDLE) IMPLANT
NS IRRIG 1000ML POUR BTL (IV SOLUTION) ×3 IMPLANT
PACK ORTHO EXTREMITY (CUSTOM PROCEDURE TRAY) ×3 IMPLANT
PAD ABD 8X10 STRL (GAUZE/BANDAGES/DRESSINGS) ×3 IMPLANT
PAD ARMBOARD 7.5X6 YLW CONV (MISCELLANEOUS) ×6 IMPLANT
SUT ETHILON 2 0 PSLX (SUTURE) ×3 IMPLANT
SYR CONTROL 10ML LL (SYRINGE) IMPLANT
TOWEL OR 17X26 10 PK STRL BLUE (TOWEL DISPOSABLE) ×3 IMPLANT

## 2017-02-23 NOTE — Progress Notes (Signed)
Pt back to room from pacu; pt alert and verbally responsive but remains drowsy.VSS;  Pt right foot has clean dry and intact coban dsg with no stain or active bleeding noted. Pt in bed with call light within reach and IV intact transfusing. Bed alarm on and will continue to closely monitor pt. Delia Heady RN

## 2017-02-23 NOTE — Progress Notes (Signed)
Initial Nutrition Assessment  DOCUMENTATION CODES:   Not applicable  INTERVENTION:  Once diet advances,   Provide Glucerna Shake po BID, each supplement provides 220 kcal and 10 grams of protein.   NUTRITION DIAGNOSIS:   Increased nutrient needs related to wound healing as evidenced by estimated needs.  GOAL:   Patient will meet greater than or equal to 90% of their needs  MONITOR:   Supplement acceptance, Diet advancement, Labs, Skin, I & O's, Weight trends  REASON FOR ASSESSMENT:   Consult Wound healing  ASSESSMENT:   56 y.o. female with medical history significant of poorly controlled diabetes, hypertension, schizophrenia, anemia, s/p of left Transmetatarsal amputation, medication noncompliance,  who presents with right foot swelling, pain, fever and chills.  Pt is currently unavailable in OR for right foot third toe amputation. RD unable to obtain most recent nutrition history. Pt with no significant weight loss per weight records. RD to order nutritional supplements to aid in wound healing. Nursing to provide once diet advances.  Unable to complete Nutrition-Focused physical exam at this time.   Labs and medications reviewed.   Diet Order:  Diet NPO time specified Except for: Ice Chips, Sips with Meds  EDUCATION NEEDS:   Not appropriate for education at this time  Skin:  Skin Assessment: Skin Integrity Issues: Skin Integrity Issues:: Other (Comment) Other: R foot osteomyelitis  Last BM:  12/13  Height:   Ht Readings from Last 1 Encounters:  02/23/17 5' 2.01" (1.575 m)    Weight:   Wt Readings from Last 1 Encounters:  02/23/17 129 lb (58.5 kg)    Ideal Body Weight:  50 kg  BMI:  Body mass index is 23.59 kg/m.  Estimated Nutritional Needs:   Kcal:  1750-1900  Protein:  80-90 grams  Fluid:  1.7 -1.9 L/day    Corrin Parker, MS, RD, LDN Pager # [phone] After hours/ weekend pager # [phone]

## 2017-02-23 NOTE — Progress Notes (Signed)
Chaplain spent time with patient. Offered empathic listening, offering space for Gina [name] to share her story.  Gina [name] is experiencing complex layered grief. She is one of 10 children, and her family is in the season where elder children are dying In 2018, the second eldest child a Brother with whom Gina [name] was close and she describes as the "glue of the family" died on the anniversary of her mother's death. At the end of visit patient expressed that she has a context of Faith that affords her the understanding that her Brother is in a better place and she "must move on."

## 2017-02-23 NOTE — Progress Notes (Signed)
Pharmacy Antibiotic Note  Gina [name] is a 56 y.o. female with right foot swelling, fever and chills admitted on 02/22/2017 with osteomyeltits.  Pharmacy has been consulted for zosyn/vancomycin dosing.  Plan: Zosyn 3.375g IV q8h (4 hour infusion).  Vancomycin 1 Gm IV q24h for est AUC=468 Daily Scr F/u cultures/levels  Height: 5\' 2"  (157.5 cm) Weight: 129 lb (58.5 kg) IBW/kg (Calculated) : 50.1  Temp (24hrs), Avg:100 F (37.8 C), Min:100 F (37.8 C), Max:100 F (37.8 C)  Recent Labs  Lab 02/20/17 1926 02/22/17 2128 02/22/17 2218  WBC 10.3 13.2*  --   CREATININE 0.73 0.66  --   LATICACIDVEN  --   --  0.93    Estimated Creatinine Clearance: 62.8 mL/min (by C-G formula based on SCr of 0.66 mg/dL).    Allergies  Allergen Reactions  . Metformin And Related Other (See Comments)    Upset stomach    Antimicrobials this admission: 12/13 zosyn >>  12/13 vancomycin >>   Dose adjustments this admission:   Microbiology results:  BCx:   UCx:    Sputum:    MRSA PCR:   Thank you for allowing pharmacy to be a part of this patient's care.  Dorrene German 02/23/2017 12:12 AM

## 2017-02-23 NOTE — Progress Notes (Signed)
Inpatient Diabetes Program Recommendations  AACE/ADA: New Consensus Statement on Inpatient Glycemic Control (2015)  Target Ranges:  Prepandial:   less than 140 mg/dL      Peak postprandial:   less than 180 mg/dL (1-2 hours)      Critically ill patients:  140 - 180 mg/dL   Lab Results  Component Value Date   GLUCAP 121 (H) 02/23/2017   HGBA1C 14.3 (H) 06/01/2016    Spoke with patient about her glucose levels and DM control at home. Patient's brother passed away this Thanksgiving. Patient has not been consistent taking her medications. Patient states "I found I was living in the past in the 90's, years ago". Unsure what she meant.  Spoke briefly to patient about controlling glucose for wound healing.   Thanks,  Tama Headings RN, MSN, [facility] Inpatient Diabetes Coordinator Team Pager [phone] (8a-5p)

## 2017-02-23 NOTE — ED Notes (Signed)
ED TO INPATIENT HANDOFF REPORT  Name/Age/Gender Gina [name] 56 y.o. female  Code Status    Code Status Orders  (From admission, onward)        Start     Ordered   02/22/17 2341  Full code  Continuous     02/22/17 2341    Code Status History    Date Active Date Inactive Code Status Order ID Comments User Context   02/20/2017 23:00 02/21/2017 22:20 Full Code [id number]  Ward, [name], PA-C ED   06/01/2016 19:44 06/07/2016 22:20 Full Code [id number]  [name], MD Inpatient   06/18/2013 11:57 06/21/2013 12:40 Full Code [id number]  [name], MD ED   07/31/2012 05:18 08/03/2012 15:26 Full Code [id number]  Theressa Millard, MD ED      Home/SNF/Other Home  Chief Complaint foot swollen/discharge  Level of Care/Admitting Diagnosis ED Disposition    ED Disposition Condition [place]: [facility] [[id number]]  Level of Care: Med-Surg [16]  Diagnosis: Osteomyelitis of right foot [facility]) [[id number]]  Admitting Physician: [name] [[id number]]  Attending Physician: [name] [id number]  Estimated length of stay: past midnight tomorrow  Certification:: I certify this patient will need inpatient services for at least 2 midnights  PT Class (Do Not Modify): Inpatient [101]  PT Acc Code (Do Not Modify): Private [1]       Medical History Past Medical History:  Diagnosis Date  . Diabetes mellitus   . Hypertension   . Schizophrenia ([place])     Allergies Allergies  Allergen Reactions  . Metformin And Related Other (See Comments)    Upset stomach    IV Location/Drains/Wounds Patient Lines/Drains/Airways Status   Active Line/Drains/Airways    Name:   Placement date:   Placement time:   Site:   Days:   Peripheral IV 02/22/17 Left Antecubital   02/22/17    2221    Antecubital   1   Negative Pressure Wound Therapy Foot Left   06/05/16    1719    -   263          Labs/Imaging Results for orders placed or performed during the  hospital encounter of 02/22/17 (from the past 48 hour(s))  Basic metabolic panel     Status: Abnormal   Collection Time: 02/22/17  9:28 PM  Result Value Ref Range   Sodium 132 (L) 135 - 145 mmol/L   Potassium 4.8 3.5 - 5.1 mmol/L   Chloride 99 (L) 101 - 111 mmol/L   CO2 25 22 - 32 mmol/L   Glucose, Bld 439 (H) 65 - 99 mg/dL   BUN 14 6 - 20 mg/dL   Creatinine, Ser 0.66 0.44 - 1.00 mg/dL   Calcium 8.7 (L) 8.9 - 10.3 mg/dL   GFR calc non Af Amer >60 >60 mL/min   GFR calc Af Amer >60 >60 mL/min    Comment: (NOTE) The eGFR has been calculated using the CKD EPI equation. This calculation has not been validated in all clinical situations. eGFR's persistently <60 mL/min signify possible Chronic Kidney Disease.    Anion gap 8 5 - 15  CBC with Differential     Status: Abnormal   Collection Time: 02/22/17  9:28 PM  Result Value Ref Range   WBC 13.2 (H) 4.0 - 10.5 K/uL   RBC 3.52 (L) 3.87 - 5.11 MIL/uL   Hemoglobin 10.7 (L) 12.0 - 15.0 g/dL   HCT 31.9 (L) 36.0 - 46.0 %  MCV 90.6 78.0 - 100.0 fL   MCH 30.4 26.0 - 34.0 pg   MCHC 33.5 30.0 - 36.0 g/dL   RDW 12.6 11.5 - 15.5 %   Platelets 242 150 - 400 K/uL   Neutrophils Relative % 81 %   Neutro Abs 10.8 (H) 1.7 - 7.7 K/uL   Lymphocytes Relative 8 %   Lymphs Abs 1.0 0.7 - 4.0 K/uL   Monocytes Relative 11 %   Monocytes Absolute 1.4 (H) 0.1 - 1.0 K/uL   Eosinophils Relative 0 %   Eosinophils Absolute 0.0 0.0 - 0.7 K/uL   Basophils Relative 0 %   Basophils Absolute 0.0 0.0 - 0.1 K/uL  I-Stat CG4 Lactic Acid, ED     Status: None   Collection Time: 02/22/17 10:18 PM  Result Value Ref Range   Lactic Acid, Venous 0.93 0.5 - 1.9 mmol/L  Protime-INR     Status: None   Collection Time: 02/22/17 11:59 PM  Result Value Ref Range   Prothrombin Time 13.3 11.4 - 15.2 seconds   INR 1.02   APTT     Status: None   Collection Time: 02/22/17 11:59 PM  Result Value Ref Range   aPTT 30 24 - 36 seconds  Procalcitonin     Status: None   Collection  Time: 02/22/17 11:59 PM  Result Value Ref Range   Procalcitonin 0.15 ng/mL    Comment:        Interpretation: PCT (Procalcitonin) <= 0.5 ng/mL: Systemic infection (sepsis) is not likely. Local bacterial infection is possible. (NOTE)       Sepsis PCT Algorithm           Lower Respiratory Tract                                      Infection PCT Algorithm    ----------------------------     ----------------------------         PCT < 0.25 ng/mL                PCT < 0.10 ng/mL         Strongly encourage             Strongly discourage   discontinuation of antibiotics    initiation of antibiotics    ----------------------------     -----------------------------       PCT 0.25 - 0.50 ng/mL            PCT 0.10 - 0.25 ng/mL               OR       >80% decrease in PCT            Discourage initiation of                                            antibiotics      Encourage discontinuation           of antibiotics    ----------------------------     -----------------------------         PCT >= 0.50 ng/mL              PCT 0.26 - 0.50 ng/mL               AND        <  80% decrease in PCT             Encourage initiation of                                             antibiotics       Encourage continuation           of antibiotics    ----------------------------     -----------------------------        PCT >= 0.50 ng/mL                  PCT > 0.50 ng/mL               AND         increase in PCT                  Strongly encourage                                      initiation of antibiotics    Strongly encourage escalation           of antibiotics                                     -----------------------------                                           PCT <= 0.25 ng/mL                                                 OR                                        > 80% decrease in PCT                                     Discontinue / Do not initiate                                              antibiotics   Sedimentation rate     Status: Abnormal   Collection Time: 02/22/17 11:59 PM  Result Value Ref Range   Sed Rate 105 (H) 0 - 22 mm/hr  Lactic acid, plasma     Status: None   Collection Time: 02/23/17  1:04 AM  Result Value Ref Range   Lactic Acid, Venous 0.6 0.5 - 1.9 mmol/L   Dg Foot 2 Views Right  Result Date: 02/22/2017 CLINICAL DATA:  56 year old with a diabetic right foot infection. EXAM: RIGHT FOOT - 2 VIEW COMPARISON:  None. FINDINGS: Diffuse soft tissue swelling. Extensive soft tissue gas throughout the third toe extending  into the plantar soft tissues underlying the second and third toes. Osteolysis involving the distal tuft of the distal phalanx. No evidence of acute or subacute fracture or dislocation. Joint spaces relatively well-preserved throughout. Small plantar calcaneal spur. IMPRESSION: 1. Osteomyelitis involving the distal phalanx of the third toe. 2. Extensive soft tissue gas throughout the third toe extending into the plantar soft tissues underlying the second and third toes, indicating a gas-forming organism. Electronically Signed   By: Evangeline Dakin M.D.   On: 02/22/2017 21:59    Pending Labs Unresulted Labs (From admission, onward)   Start     Ordered   02/24/17 0500  Creatinine, serum  Daily,   R     02/23/17 0011   02/23/17 3149  Basic metabolic panel  Tomorrow morning,   R     02/22/17 2341   02/23/17 0500  CBC  Tomorrow morning,   R     02/22/17 2341   02/22/17 2340  Hemoglobin A1c  Once,   R     02/22/17 2339   02/22/17 2340  HIV antibody  Once,   R     02/22/17 2339   02/22/17 2340  Prealbumin  Once,   R     02/22/17 2339   02/22/17 2339  Culture, blood (x 2)  BLOOD CULTURE X 2,   STAT    Comments:  INITIATE ANTIBIOTICS WITHIN 1 HOUR AFTER BLOOD CULTURES DRAWN.  If unable to obtain blood cultures, call MD immediately regarding antibiotic instructions.    02/22/17 2338   02/22/17 2339  Lactic acid, plasma  STAT Now then every 3 hours,    STAT     02/22/17 2338   02/22/17 2339  C-reactive protein  Once,   R     02/22/17 2339      Vitals/Pain Today's Vitals   02/22/17 2221 02/23/17 0005 02/23/17 0130 02/23/17 0156  BP: 128/76  (!) 142/90 (!) 142/90  Pulse: 93  86 81  Resp: 16  17 19   Temp:    (!) 97.4 F (36.3 C)  TempSrc:    Oral  SpO2: 95%  99% 96%  Weight:  129 lb (58.5 kg)    Height:  5' 2"  (1.575 m)    PainSc:        Isolation Precautions No active isolations  Medications Medications  polyethylene glycol (MIRALAX / GLYCOLAX) packet 17 g (not administered)  insulin glargine (LANTUS) injection 20 Units (not administered)  oxyCODONE-acetaminophen (PERCOCET/ROXICET) 5-325 MG per tablet 1 tablet (not administered)  0.9 %  sodium chloride infusion (not administered)  acetaminophen (TYLENOL) tablet 650 mg (not administered)    Or  acetaminophen (TYLENOL) suppository 650 mg (not administered)  ondansetron (ZOFRAN) tablet 4 mg (not administered)    Or  ondansetron (ZOFRAN) injection 4 mg (not administered)  hydrALAZINE (APRESOLINE) injection 5 mg (not administered)  zolpidem (AMBIEN) tablet 5 mg (not administered)  escitalopram (LEXAPRO) tablet 10 mg (not administered)  insulin aspart (novoLOG) injection 0-9 Units (not administered)  vancomycin (VANCOCIN) IVPB 1000 mg/200 mL premix (not administered)  piperacillin-tazobactam (ZOSYN) IVPB 3.375 g (not administered)  vancomycin (VANCOCIN) IVPB 1000 mg/200 mL premix (0 mg Intravenous Stopped 02/23/17 0004)  piperacillin-tazobactam (ZOSYN) IVPB 3.375 g (0 g Intravenous Stopped 02/22/17 2252)  sodium chloride 0.9 % bolus 1,000 mL (0 mLs Intravenous Stopped 02/22/17 2304)  sodium chloride 0.9 % bolus 1,500 mL (0 mLs Intravenous Stopped 02/23/17 0306)  sodium chloride (OCEAN) 0.65 % nasal spray 1 spray (1 spray Each  Nare Given 02/23/17 0236)    Mobility walks with device

## 2017-02-23 NOTE — Plan of Care (Signed)
  Progressing Spiritual Needs Ability to function at adequate level 02/23/2017 1159 - Progressing by Rance Muir, RN Education: Knowledge of General Education information will improve 02/23/2017 1159 - Progressing by Rance Muir, RN Health Behavior/Discharge Planning: Ability to manage health-related needs will improve 02/23/2017 1159 - Progressing by Rance Muir, RN Clinical Measurements: Ability to maintain clinical measurements within normal limits will improve 02/23/2017 1159 - Progressing by Rance Muir, RN Will remain free from infection 02/23/2017 1159 - Progressing by Rance Muir, RN Diagnostic test results will improve 02/23/2017 1159 - Progressing by Rance Muir, RN Respiratory complications will improve 02/23/2017 1159 - Progressing by Rance Muir, RN Cardiovascular complication will be avoided 02/23/2017 1159 - Progressing by Rance Muir, RN Activity: Risk for activity intolerance will decrease 02/23/2017 1159 - Progressing by Rance Muir, RN Nutrition: Adequate nutrition will be maintained 02/23/2017 1159 - Progressing by Rance Muir, RN Coping: Level of anxiety will decrease 02/23/2017 1159 - Progressing by Rance Muir, RN Elimination: Will not experience complications related to bowel motility 02/23/2017 1159 - Progressing by Rance Muir, RN Will not experience complications related to urinary retention 02/23/2017 1159 - Progressing by Rance Muir, RN Pain Managment: General experience of comfort will improve 02/23/2017 1159 - Progressing by Rance Muir, RN Safety: Ability to remain free from injury will improve 02/23/2017 1159 - Progressing by Rance Muir, RN Skin Integrity: Risk for impaired skin integrity will decrease 02/23/2017 1159 - Progressing by Rance Muir, RN

## 2017-02-23 NOTE — ED Notes (Signed)
Bed assigned @ [place]

## 2017-02-23 NOTE — Progress Notes (Signed)
PROGRESS NOTE  Gina [name]  FBP:[id number] DOB: 1960-06-07 DOA: 02/22/2017 PCP: Alfonse Spruce, FNP  Brief Narrative:   Gina [name] is a 56 y.o. female with history of poorly controlled diabetes, hypertension, schizophrenia, anemia, s/p of left Transmetatarsal amputation, medication noncompliance, who presented with right foot swelling, pain, fever and chills.  She was found to have osteomyelitis of the distal phalanx of the third toe and extensive soft tissue gas throughout the third toe extending into the plantar soft tissues underlying the second and third toes based on XR.  Started on vancomycin and Zosyn.  Case was discussed with Dr. Sharol Given who recommended she be transferred to [facility] where he is operating today.  He anticipates taking her to the operating room this afternoon for debridement.  Assessment & Plan:  Osteomyelitis of right foot (HCC) - Continue vancomycin and Zosyn - PRN Zofran for nausea, and Percocet for pain - Blood cultures x 2  - ESR 105 and CRP 7.7 - ABI - Dr. Sharol Given to operate this afternoon.  Transferring to [facility].  HTN: Blood pressure normal, patient noncompliant with home medications. -IVF hydralazine when necessary  DM (diabetes mellitus), type 2, uncontrolled, periph vascular complic ([place]): Last O2U 23.5 on 06/01/16, poorly controlled. Patient was on Metformin, NovoLog and Lantus, but currently is not taking medications. Blood sugar 439. Anion gap normal. -Lantus 20 units daily (was on 28 units before) -SSI -f/u A1c  Anemia of chronic disease: hemoglobin stable, 10.7 -Follow-up by CBC  Schizophrenia ([place]): was on Lexapro, but not taking medications currently. Denies suicidal or homicidal ideations. -Resume Lexapro  DVT ppx: SCD Code Status: Full code Family Communication:    Spoke to patient's sister by phone Disposition Plan:  Anticipate discharge back to previous home environment  Consultants:   Dr. Sharol Given, orthopedic  surgery  Procedures:  None  Antimicrobials:  Anti-infectives (From admission, onward)   Start     Dose/Rate Route Frequency Ordered Stop   02/23/17 1400  [MAR Hold]  vancomycin (VANCOCIN) IVPB 1000 mg/200 mL premix     (MAR Hold since 02/23/17 1335)   1,000 mg 200 mL/hr over 60 Minutes Intravenous Every 24 hours 02/23/17 0011     02/23/17 0600  [MAR Hold]  piperacillin-tazobactam (ZOSYN) IVPB 3.375 g     (MAR Hold since 02/23/17 1335)   3.375 g 12.5 mL/hr over 240 Minutes Intravenous Every 8 hours 02/23/17 0011     02/22/17 2130  vancomycin (VANCOCIN) IVPB 1000 mg/200 mL premix     1,000 mg 200 mL/hr over 60 Minutes Intravenous  Once 02/22/17 2128 02/23/17 0004   02/22/17 2130  piperacillin-tazobactam (ZOSYN) IVPB 3.375 g     3.375 g 100 mL/hr over 30 Minutes Intravenous  Once 02/22/17 2128 02/22/17 2252       Subjective:  Denies subjective fevers or chills.  Having some pain in the right foot.  Denies nausea, vomiting.  She has been eating some ice chips and having some sips of water.  Objective: Vitals:   02/23/17 0430 02/23/17 0600 02/23/17 1300 02/23/17 1346  BP: 138/77 133/79 136/65   Pulse: 83 85 87   Resp: _0 Temp:  99 F (37.2 C) 98.9 F (37.2 C)   TempSrc:  Oral Oral   SpO2: 96% 96% 96%   Weight:    58.5 kg (129 lb)  Height:    5' 2.01" (1.575 m)    Intake/Output Summary (Last 24 hours) at 02/23/2017 1616 Last data filed  at 02/23/2017 1604 Gross per 24 hour  Intake 3665 ml  Output 30 ml  Net 3635 ml   Filed Weights   02/23/17 0005 02/23/17 1346  Weight: 58.5 kg (129 lb) 58.5 kg (129 lb)    Examination:  General exam:  Adult female.  No acute distress.  HEENT:  NCAT, MMM Respiratory system: Clear to auscultation bilaterally Cardiovascular system: Regular rate and rhythm, normal S1/S2. No murmurs, rubs, gallops or clicks.  Warm extremities Gastrointestinal system: Normal active bowel sounds, soft, nondistended, nontender. MSK:  Normal tone  and bulk, 1+ pitting edema of the right lower extremity.  Has swelling and erythema on the dorsal and plantar aspects of the right foot with some erythema that extends to the ankle.  She has swelling particularly of the second and third toes of the right foot with some discoloration/darkening of those toes.  On the plantar aspect of her foot she has a well demarcated area of black skin discoloration that extends from the base of the second, third, and fourth toes to about the mid ball of the foot.  The left foot status post transmetatarsal amputation that was performed earlier this year by Dr. Sharol Given.  There does not appear to be any infection on this foot. Neuro: Decreased sensation over bilateral feet    Data Reviewed: I have personally reviewed following labs and imaging studies  CBC: Recent Labs  Lab 02/20/17 1926 02/22/17 2128 02/23/17 0602  WBC 10.3 13.2* 11.9*  NEUTROABS  --  10.8*  --   HGB 11.4* 10.7* 9.5*  HCT 34.2* 31.9* 28.9*  MCV 92.4 90.6 91.7  PLT 230 242 754   Basic Metabolic Panel: Recent Labs  Lab 02/20/17 1926 02/22/17 2128 02/23/17 0602  NA 125* 132* 135  K 3.9 4.8 3.8  CL 90* 99* 105  CO2 _0 GLUCOSE 787* 439* 301*  BUN _1 CREATININE 0.73 0.66 0.43*  CALCIUM 9.2 8.7* 8.2*   GFR: Estimated Creatinine Clearance: 62.8 mL/min (A) (by C-G formula based on SCr of 0.43 mg/dL (L)). Liver Function Tests: Recent Labs  Lab 02/20/17 1926  AST 27  ALT 30  ALKPHOS 103  BILITOT 0.5  PROT 7.6  ALBUMIN 3.4*   No results for input(s): LIPASE, AMYLASE in the last 168 hours. No results for input(s): AMMONIA in the last 168 hours. Coagulation Profile: Recent Labs  Lab 02/22/17 2359  INR 1.02   Cardiac Enzymes: No results for input(s): CKTOTAL, CKMB, CKMBINDEX, TROPONINI in the last 168 hours. BNP (last 3 results) No results for input(s): PROBNP in the last 8760 hours. HbA1C: No results for input(s): HGBA1C in the last 72 hours. CBG: Recent Labs   Lab 02/21/17 1204 02/21/17 1705 02/23/17 0734 02/23/17 1233 02/23/17 1344  GLUCAP 202* 191* 269* 121* 102*   Lipid Profile: No results for input(s): CHOL, HDL, LDLCALC, TRIG, CHOLHDL, LDLDIRECT in the last 72 hours. Thyroid Function Tests: No results for input(s): TSH, T4TOTAL, FREET4, T3FREE, THYROIDAB in the last 72 hours. Anemia Panel: No results for input(s): VITAMINB12, FOLATE, FERRITIN, TIBC, IRON, RETICCTPCT in the last 72 hours. Urine analysis:    Component Value Date/Time   COLORURINE STRAW (A) 02/20/2017 2359   APPEARANCEUR CLEAR 02/20/2017 2359   LABSPEC 1.030 02/20/2017 2359   PHURINE 6.0 02/20/2017 2359   GLUCOSEU >=500 (A) 02/20/2017 2359   HGBUR SMALL (A) 02/20/2017 [place] NEGATIVE 02/20/2017 [place] 04/28/2016 [place] 02/20/2017 2359  PROTEINUR NEGATIVE 02/20/2017 2359   UROBILINOGEN 0.2 04/28/2016 1603   UROBILINOGEN 0.2 06/18/2013 0650   NITRITE NEGATIVE 02/20/2017 2359   LEUKOCYTESUR NEGATIVE 02/20/2017 2359   Sepsis Labs: _0 (procalcitonin:4,lacticidven:4)  )No results found for this or any previous visit (from the past 240 hour(s)).    Radiology Studies: Dg Foot 2 Views Right  Result Date: 02/22/2017 CLINICAL DATA:  56 year old with a diabetic right foot infection. EXAM: RIGHT FOOT - 2 VIEW COMPARISON:  None. FINDINGS: Diffuse soft tissue swelling. Extensive soft tissue gas throughout the third toe extending into the plantar soft tissues underlying the second and third toes. Osteolysis involving the distal tuft of the distal phalanx. No evidence of acute or subacute fracture or dislocation. Joint spaces relatively well-preserved throughout. Small plantar calcaneal spur. IMPRESSION: 1. Osteomyelitis involving the distal phalanx of the third toe. 2. Extensive soft tissue gas throughout the third toe extending into the plantar soft tissues underlying the second and third toes, indicating a gas-forming  organism. Electronically Signed   By: Evangeline Dakin M.D.   On: 02/22/2017 21:59     Scheduled Meds: . [MAR Hold] escitalopram  10 mg Oral Daily  . [START ON 02/24/2017] feeding supplement (GLUCERNA SHAKE)  237 mL Oral BID BM  . [MAR Hold] insulin aspart  0-9 Units Subcutaneous TID WC  . [MAR Hold] insulin glargine  20 Units Subcutaneous Q2200   Continuous Infusions: . sodium chloride 100 mL/hr at 02/23/17 0621  . lactated ringers 10 mL/hr at 02/23/17 1348  . [MAR Hold] piperacillin-tazobactam (ZOSYN)  IV Stopped (02/23/17 0940)  . [MAR Hold] vancomycin       LOS: 1 day    Time spent: 30 min    Janece Canterbury, MD [facility] Pager [phone]  If 7PM-7AM, please contact night-coverage [email or web address] Password [email or web address] 02/23/2017, 4:16 PM

## 2017-02-23 NOTE — ED Notes (Signed)
Blood cultures were ordered and sent after the antibiotics were administered. Hospitalist made aware.

## 2017-02-23 NOTE — Consult Note (Signed)
ORTHOPAEDIC CONSULTATION  REQUESTING PHYSICIAN: Janece Canterbury, MD  Chief Complaint: Abscess ulceration right foot third toe.  HPI: Gina [name] is a 56 y.o. female who presents with a several week history of ulceration abscess swelling to the right foot third toe.  Patient has diabetic insensate neuropathy.  Past Medical History:  Diagnosis Date  . Diabetes mellitus   . Hypertension   . Schizophrenia [facility])    Past Surgical History:  Procedure Laterality Date  . AMPUTATION Left 06/05/2016   Procedure: LEFT FOOT TRANSMETATARSAL AMPUTATION;  Surgeon: [name], MD;  Location: [facility] ORS;  Service: Orthopedics;  Laterality: Left;  . TUBAL LIGATION     Social History   Socioeconomic History  . Marital status: Single    Spouse name: None  . Number of children: None  . Years of education: None  . Highest education level: None  Social Needs  . Financial resource strain: None  . Food insecurity - worry: None  . Food insecurity - inability: None  . Transportation needs - medical: None  . Transportation needs - non-medical: None  Occupational History  . None  Tobacco Use  . Smoking status: Never Smoker  . Smokeless tobacco: Never Used  Substance and Sexual Activity  . Alcohol use: No    Comment: occasionally  . Drug use: No  . Sexual activity: None  Other Topics Concern  . None  Social History Narrative  . None   Family History  Problem Relation Age of Onset  . Diabetes type II Father   . CAD Father   . Prostate cancer Father   . Diabetes Mellitus II Brother    - negative except otherwise stated in the family history section Allergies  Allergen Reactions  . Metformin And Related Other (See Comments)    Upset stomach   Prior to Admission medications   Medication Sig Start Date End Date Taking? Authorizing Provider  acetaminophen (TYLENOL) 325 MG tablet Take 2 tablets (650 mg total) by mouth every 6 (six) hours as needed for mild pain (or Fever >/=  101). Patient not taking: Reported on 02/20/2017 06/07/16   [name], MD  amLODipine (NORVASC) 10 MG tablet Take 1 tablet (10 mg total) by mouth daily. Patient not taking: Reported on 02/20/2017 06/23/16   Alfonse Spruce, FNP  bisacodyl (DULCOLAX) 10 MG suppository Place 1 suppository (10 mg total) rectally daily as needed for moderate constipation. Patient not taking: Reported on 02/20/2017 06/07/16   [name], MD  docusate sodium (COLACE) 100 MG capsule Take 1 capsule (100 mg total) by mouth 2 (two) times daily. Patient not taking: Reported on 02/20/2017 06/07/16   [name], MD  escitalopram (LEXAPRO) 10 MG tablet Take 1 tablet (10 mg total) by mouth daily. After 1 week may increase take 2 tablets (20 mg total) by mouth daily. Patient not taking: Reported on 02/20/2017 06/23/16   Alfonse Spruce, FNP  ferrous sulfate 325 (65 FE) MG tablet Take 1 tablet (325 mg total) by mouth 2 (two) times daily with a meal. Patient not taking: Reported on 02/20/2017 06/28/16   Alfonse Spruce, FNP  insulin aspart (NOVOLOG FLEXPEN) 100 UNIT/ML FlexPen Inject 2-15 Units into the skin 3 (three) times daily with meals. Take 3 U Novolog with each meal and add on more units based on you blood sugars   CBG 70 - 120: 0 units  CBG 121 - 150: 2 units  CBG 151 - 200: 3 units  CBG 201 -  250: 5 units  CBG 251 - 300: 8 units  CBG 301 - 350: 11 units  CBG 351 - 400: 15 units Patient not taking: Reported on 02/20/2017 06/23/16   Alfonse Spruce, FNP  insulin aspart (NOVOLOG) 100 UNIT/ML injection Inject 0-15 Units into the skin 3 (three) times daily with meals. Patient not taking: Reported on 02/20/2017 06/07/16   [name], MD  Insulin Glargine (LANTUS) 100 UNIT/ML Solostar Pen Inject 28 Units into the skin daily at 10 pm. Patient not taking: Reported on 02/20/2017 06/23/16   Alfonse Spruce, FNP  lisinopril (PRINIVIL,ZESTRIL) 2.5 MG tablet Take 1 tablet (2.5 mg total) by mouth  daily. Patient not taking: Reported on 02/20/2017 06/07/16   [name], MD  metFORMIN (GLUCOPHAGE-XR) 500 MG 24 hr tablet Take 2 tablets (1,000 mg total) by mouth 2 (two) times daily. Patient not taking: Reported on 02/20/2017 04/28/16   Alfonse Spruce, FNP  oxyCODONE (OXY IR/ROXICODONE) 5 MG immediate release tablet Take 1-2 tablets (5-10 mg total) by mouth every 4 (four) hours as needed for breakthrough pain. Patient not taking: Reported on 02/20/2017 06/07/16   [name], MD  polyethylene glycol [facility]) packet Take 17 g by mouth daily as needed for mild constipation. Patient not taking: Reported on 02/20/2017 06/07/16   [name], MD  senna-docusate (SENOKOT-S) 8.6-50 MG tablet Take 2 tablets by mouth daily. Patient not taking: Reported on 02/20/2017 06/07/16   [name], MD   Dg Foot 2 Views Right  Result Date: 02/22/2017 CLINICAL DATA:  56 year old with a diabetic right foot infection. EXAM: RIGHT FOOT - 2 VIEW COMPARISON:  None. FINDINGS: Diffuse soft tissue swelling. Extensive soft tissue gas throughout the third toe extending into the plantar soft tissues underlying the second and third toes. Osteolysis involving the distal tuft of the distal phalanx. No evidence of acute or subacute fracture or dislocation. Joint spaces relatively well-preserved throughout. Small plantar calcaneal spur. IMPRESSION: 1. Osteomyelitis involving the distal phalanx of the third toe. 2. Extensive soft tissue gas throughout the third toe extending into the plantar soft tissues underlying the second and third toes, indicating a gas-forming organism. Electronically Signed   By: Evangeline Dakin M.D.   On: 02/22/2017 21:59   - pertinent xrays, CT, MRI studies were reviewed and independently interpreted  Positive ROS: All other systems have been reviewed and were otherwise negative with the exception of those mentioned in the HPI and as above.  Physical Exam: General: Alert, no acute  distress Psychiatric: Patient is competent for consent with normal mood and affect Lymphatic: No axillary or cervical lymphadenopathy Cardiovascular: No pedal edema Respiratory: No cyanosis, no use of accessory musculature GI: No organomegaly, abdomen is soft and non-tender  Skin: Examination of the skin there is sausage digit swelling of the third toe there is cellulitis abscess ulceration and drainage.   Neurologic: Patient does not have protective sensation bilateral lower extremities.   MUSCULOSKELETAL:  Examination patient has a good dorsalis pedis pulse.  There is no ascending cellulitis.  Radiographs shows air in the soft tissue around the third toe no destructive bony changes.  Assessment: Assessment: Abscess ulceration right foot third toe.  Plan: Plan: Will plan for third ray amputation.  Patient may require further surgical intervention or debridement but will try to decompress the abscess and ulcer at this time for foot salvage intervention.  Risks and benefits were discussed including infection neurovascular injury nonhealing of the wound need for higher level amputation.  Thank you for  the consult and the opportunity to see [name], MD [place] [phone] 3:22 PM

## 2017-02-23 NOTE — Social Work (Signed)
CSW met with Social Worker, Social Service, [place], [phone], to advise that she is following patient in community. CSW will communicate with her on disposition.  CSW will continue to follow.  Elissa Hefty, LCSW Clinical Social Worker [phone]

## 2017-02-23 NOTE — Transfer of Care (Signed)
Immediate Anesthesia Transfer of Care Note  Patient: Gina [name]  Procedure(s) Performed: AMPUTATION RIGHT THIRD TOE (Right Toe)  Patient Location: PACU  Anesthesia Type:General  Level of Consciousness: awake and alert   Airway & Oxygen Therapy: Patient Spontanous Breathing and Patient connected to face mask oxygen  Post-op Assessment: Report given to RN, Post -op Vital signs reviewed and stable and Patient moving all extremities X 4  Post vital signs: Reviewed and stable  Last Vitals:  Vitals:   02/23/17 1300 02/23/17 1619  BP: 136/65   Pulse: 87   Resp: 17   Temp: 37.2 C (!) 36.2 C  SpO2: 96%     Last Pain:  Vitals:   02/23/17 1619  TempSrc:   PainSc: (P) Asleep      Patients Stated Pain Goal: 3 (78/97/84 7841)  Complications: No apparent anesthesia complications

## 2017-02-23 NOTE — Progress Notes (Signed)
Report received from [place] at this time

## 2017-02-23 NOTE — Op Note (Signed)
02/23/2017  4:12 PM  PATIENT:  Gina [name]    PRE-OPERATIVE DIAGNOSIS:  abscess right third toe  POST-OPERATIVE DIAGNOSIS:  Same  PROCEDURE:  AMPUTATION RIGHT THIRD TOE, third ray resection.  SURGEON:  [name], MD  PHYSICIAN ASSISTANT:None ANESTHESIA:   General  PREOPERATIVE INDICATIONS:  Gina [name] is a  56 y.o. female with a diagnosis of abscess right third toe who failed conservative measures and elected for surgical management.    The risks benefits and alternatives were discussed with the patient preoperatively including but not limited to the risks of infection, bleeding, nerve injury, cardiopulmonary complications, the need for revision surgery, among others, and the patient was willing to proceed.  OPERATIVE IMPLANTS: None.  OPERATIVE FINDINGS: Large deep abscess with necrotic soft tissue.  OPERATIVE PROCEDURE: Patient was brought the operating room and underwent a general anesthetic.  After adequate levels of anesthesia were obtained patient's right lower extremity was prepped using DuraPrep draped in the sterile field a timeout was called.  A V incision was made around the third ray.  The ray was resected through the metatarsal shaft.  Patient had massive amount of necrotic tissue and abscess.  This was sent for cultures x2.  Electrocautery was used for hemostasis.  Further soft tissue was resected around the ray.  The wound was irrigated with normal saline.  The incision was closed using 2-0 nylon.  This was left open distally due to the soft tissue envelope.  The wound was covered with a sterile dressing patient was extubated taken the PACU in stable condition.   DISCHARGE PLANNING:  Antibiotic duration: Patient will need IV antibiotics for at least 3 days until the cultures are finalized and she will need 4 weeks of oral antibiotics.  Weightbearing: Strict nonweightbearing right lower extremity.  Pain medication: As needed.  Dressing care/ Wound VAC:  Continue dry dressing change as needed.  Ambulatory devices: Walker or other device for strict nonweightbearing right lower extremity.  Discharge to: Discharge to home if patient is safe with a walker.  Follow-up: In the office 1 week post operative.

## 2017-02-23 NOTE — Anesthesia Preprocedure Evaluation (Addendum)
Anesthesia Evaluation  Patient identified by MRN, date of birth, ID band Patient awake    Reviewed: Allergy & Precautions, NPO status , Patient's Chart, lab work & pertinent test results  Airway Mallampati: II  TM Distance: <3 FB Neck ROM: Full    Dental no notable dental hx. (+) Partial Lower, Missing, Poor Dentition, Dental Advisory Given   Pulmonary neg pulmonary ROS,    breath sounds clear to auscultation       Cardiovascular hypertension, + Peripheral Vascular Disease   Rhythm:Regular Rate:Normal     Neuro/Psych negative neurological ROS     GI/Hepatic negative GI ROS, Neg liver ROS,   Endo/Other  diabetes, Poorly Controlled  Renal/GU      Musculoskeletal   Abdominal   Peds  Hematology  (+) anemia ,   Anesthesia Other Findings   Reproductive/Obstetrics                            Anesthesia Physical Anesthesia Plan  ASA: III  Anesthesia Plan: General   Post-op Pain Management:    Induction: Intravenous  PONV Risk Score and Plan: Treatment may vary due to age or medical condition, Dexamethasone and Ondansetron  Airway Management Planned: LMA  Additional Equipment:   Intra-op Plan:   Post-operative Plan: Extubation in OR  Informed Consent: I have reviewed the patients History and Physical, chart, labs and discussed the procedure including the risks, benefits and alternatives for the proposed anesthesia with the patient or authorized representative who has indicated his/her understanding and acceptance.   Dental advisory given  Plan Discussed with: CRNA  Anesthesia Plan Comments:         Anesthesia Quick Evaluation

## 2017-02-23 NOTE — Anesthesia Procedure Notes (Signed)
Procedure Name: LMA Insertion Date/Time: 02/23/2017 3:42 PM Performed by: Inda Coke, CRNA Pre-anesthesia Checklist: Patient identified, Emergency Drugs available, Suction available and Patient being monitored Patient Re-evaluated:Patient Re-evaluated prior to induction Oxygen Delivery Method: Circle System Utilized Preoxygenation: Pre-oxygenation with 100% oxygen Induction Type: IV induction Ventilation: Mask ventilation without difficulty LMA: LMA inserted LMA Size: 4.0 Number of attempts: 1 Airway Equipment and Method: Bite block Placement Confirmation: positive ETCO2 and breath sounds checked- equal and bilateral Tube secured with: Tape Dental Injury: Teeth and Oropharynx as per pre-operative assessment

## 2017-02-23 NOTE — ED Notes (Signed)
Please call Claiborne Billings, RN at [phone] @ [phone]

## 2017-02-24 LAB — GLUCOSE, CAPILLARY
Glucose-Capillary: 111 mg/dL — ABNORMAL HIGH (ref 65–99)
Glucose-Capillary: 223 mg/dL — ABNORMAL HIGH (ref 65–99)
Glucose-Capillary: 278 mg/dL — ABNORMAL HIGH (ref 65–99)
Glucose-Capillary: 306 mg/dL — ABNORMAL HIGH (ref 65–99)

## 2017-02-24 LAB — CREATININE, SERUM
Creatinine, Ser: 0.61 mg/dL (ref 0.44–1.00)
GFR calc non Af Amer: >60 mL/min (ref >60)

## 2017-02-24 LAB — HEMOGLOBIN A1C
HEMOGLOBIN A1C: 15.4 % — AB (ref 4.8–5.6)
Mean Plasma Glucose: 395 mg/dL

## 2017-02-24 NOTE — Progress Notes (Signed)
Patient retaining urine . Bladder scan 740 ml. Text paged Walden Field, NP. Orders received for In and out cath.

## 2017-02-24 NOTE — Progress Notes (Signed)
PROGRESS NOTE  Gina [name]  MEQ:[id number] DOB: May 30, 1960 DOA: 02/22/2017 PCP: Alfonse Spruce, FNP  Brief Narrative:   Gina [name] is a 56 y.o. female with history of poorly controlled diabetes, hypertension, schizophrenia, anemia, s/p of left Transmetatarsal amputation, medication noncompliance, who presented with right foot swelling, pain, fever and chills.  She was found to have osteomyelitis of the distal phalanx of the right third toe. vancomycin and Zosyn day #2.  POD #1 post right 3rd toe amputation.  Pt seen and examined with family member at her bedside. She admits to pain in her right foot dull 8/10 non radiating. Has no other complaints.    Assessment & Plan:  POD #1 post right 3rd toe amputation due to osteomyelitis of right foot (HCC) - Continue vancomycin and Zosyn day #2 - PRN Zofran for nausea, and Percocet for pain - Blood cultures x 2 no growth 1 day - ESR 105 and CRP 7.7 (02/22/17) - post right 3rd toe amputation.  HTN -Blood pressure well controlled -noncompliant with home medications. -IVF hydralazine prn  DM (diabetes mellitus), type 2, uncontrolled, periph vascular complication ([place]): -Last A1c 14.3 on 06/01/16 -Metformin, NovoLog and Lantus -Lantus 20 units daily -SSI with hypoglycemic protocol -A1c 15.4 (02/22/17)  Anemia of chronic disease/blood loss -hg 9.5 from 10.7 -baseline hg 11 -CBC am  Schizophrenia ([place]):  -on Lexapro, but not taking medications currently -Resume Lexapro  DVT ppx: SCDs; defer to orthopedic surgery for chemical DVT ppx Code Status: Full code Family Communication:  Family member at bedside. Disposition Plan:  will stay another midnight to continue current management  Consultants:   Dr. Sharol Given, orthopedic surgery  Procedures:  Post right 3rd toe amputation POD # 1  Antimicrobials:  Anti-infectives (From admission, onward)   Start     Dose/Rate Route Frequency Ordered Stop   02/23/17 1400   vancomycin (VANCOCIN) IVPB 1000 mg/200 mL premix     1,000 mg 200 mL/hr over 60 Minutes Intravenous Every 24 hours 02/23/17 0011     02/23/17 0600  piperacillin-tazobactam (ZOSYN) IVPB 3.375 g     3.375 g 12.5 mL/hr over 240 Minutes Intravenous Every 8 hours 02/23/17 0011     02/22/17 2130  vancomycin (VANCOCIN) IVPB 1000 mg/200 mL premix     1,000 mg 200 mL/hr over 60 Minutes Intravenous  Once 02/22/17 2128 02/23/17 0004   02/22/17 2130  piperacillin-tazobactam (ZOSYN) IVPB 3.375 g     3.375 g 100 mL/hr over 30 Minutes Intravenous  Once 02/22/17 2128 02/22/17 2252       Subjective:  Denies subjective fevers or chills.  Having some pain in the right foot.  Denies nausea, vomiting.  She has been eating some ice chips and having some sips of water.  Objective: Vitals:   02/23/17 1735 02/23/17 2028 02/24/17 0000 02/24/17 0441  BP: 101/63 115/65 113/63 115/65  Pulse: 83 88 93 93  Resp: 16  18 18   Temp: 98.6 F (37 C) 99.8 F (37.7 C) 99.9 F (37.7 C) 100 F (37.8 C)  TempSrc: Oral Oral Oral Oral  SpO2: 97% 97% 99% 99%  Weight:      Height:        Intake/Output Summary (Last 24 hours) at 02/24/2017 0817 Last data filed at 02/24/2017 0122 Gross per 24 hour  Intake 1139.33 ml  Output 830 ml  Net 309.33 ml   Filed Weights   02/23/17 0005 02/23/17 1346  Weight: 58.5 kg (129 lb) 58.5 kg (129 lb)  Examination:  General exam:  Adult female.  No acute distress.  HEENT:  NCAT, MMM Respiratory system: Clear to auscultation bilaterally Cardiovascular system: Regular rate and rhythm, normal S1/S2. No murmurs, rubs, gallops or clicks.  Warm extremities Gastrointestinal system: Normal active bowel sounds, soft, nondistended, nontender. MSK: right foot wrapped in surgical dressing. Left foot is partially amputated with no lesion appearing. Neuro: Decreased sensation over bilateral feet    Data Reviewed: I have personally reviewed following labs and imaging  studies  CBC: Recent Labs  Lab 02/20/17 1926 02/22/17 2128 02/23/17 0602  WBC 10.3 13.2* 11.9*  NEUTROABS  --  10.8*  --   HGB 11.4* 10.7* 9.5*  HCT 34.2* 31.9* 28.9*  MCV 92.4 90.6 91.7  PLT 230 242 970   Basic Metabolic Panel: Recent Labs  Lab 02/20/17 1926 02/22/17 2128 02/23/17 0602 02/24/17 0532  NA 125* 132* 135  --   K 3.9 4.8 3.8  --   CL 90* 99* 105  --   CO2 26 25 23   --   GLUCOSE 787* 439* 301*  --   BUN 9 14 11   --   CREATININE 0.73 0.66 0.43* 0.61  CALCIUM 9.2 8.7* 8.2*  --    GFR: Estimated Creatinine Clearance: 62.8 mL/min (by C-G formula based on SCr of 0.61 mg/dL). Liver Function Tests: Recent Labs  Lab 02/20/17 1926  AST 27  ALT 30  ALKPHOS 103  BILITOT 0.5  PROT 7.6  ALBUMIN 3.4*   No results for input(s): LIPASE, AMYLASE in the last 168 hours. No results for input(s): AMMONIA in the last 168 hours. Coagulation Profile: Recent Labs  Lab 02/22/17 2359  INR 1.02   Cardiac Enzymes: No results for input(s): CKTOTAL, CKMB, CKMBINDEX, TROPONINI in the last 168 hours. BNP (last 3 results) No results for input(s): PROBNP in the last 8760 hours. HbA1C: Recent Labs    02/22/17 2204  HGBA1C 15.4*   CBG: Recent Labs  Lab 02/23/17 1233 02/23/17 1344 02/23/17 1649 02/23/17 2033 02/24/17 0734  GLUCAP 121* 102* 90 110* 111*   Lipid Profile: No results for input(s): CHOL, HDL, LDLCALC, TRIG, CHOLHDL, LDLDIRECT in the last 72 hours. Thyroid Function Tests: No results for input(s): TSH, T4TOTAL, FREET4, T3FREE, THYROIDAB in the last 72 hours. Anemia Panel: No results for input(s): VITAMINB12, FOLATE, FERRITIN, TIBC, IRON, RETICCTPCT in the last 72 hours. Urine analysis:    Component Value Date/Time   COLORURINE STRAW (A) 02/20/2017 2359   APPEARANCEUR CLEAR 02/20/2017 2359   LABSPEC 1.030 02/20/2017 2359   PHURINE 6.0 02/20/2017 2359   GLUCOSEU >=500 (A) 02/20/2017 2359   HGBUR SMALL (A) 02/20/2017 2359   BILIRUBINUR NEGATIVE  02/20/2017 2359   BILIRUBINUR N 04/28/2016 1603   KETONESUR NEGATIVE 02/20/2017 2359   PROTEINUR NEGATIVE 02/20/2017 2359   UROBILINOGEN 0.2 04/28/2016 1603   UROBILINOGEN 0.2 06/18/2013 0650   NITRITE NEGATIVE 02/20/2017 2359   LEUKOCYTESUR NEGATIVE 02/20/2017 2359   Sepsis Labs: @LABRCNTIP (procalcitonin:4,lacticidven:4)  ) Recent Results (from the past 240 hour(s))  Aerobic/Anaerobic Culture (surgical/deep wound)     Status: None (Preliminary result)   Collection Time: 02/23/17  3:41 PM  Result Value Ref Range Status   Specimen Description ABSCESS RIGHT TOE  Final   Special Requests THIRD TOE PATIENT ON VANCOMYCIN  Final   Gram Stain   Final    ABUNDANT WBC PRESENT, PREDOMINANTLY PMN ABUNDANT GRAM POSITIVE COCCI FEW GRAM NEGATIVE RODS    Culture PENDING  Incomplete   Report Status PENDING  Incomplete      Radiology Studies: Dg Foot 2 Views Right  Result Date: 02/22/2017 CLINICAL DATA:  56 year old with a diabetic right foot infection. EXAM: RIGHT FOOT - 2 VIEW COMPARISON:  None. FINDINGS: Diffuse soft tissue swelling. Extensive soft tissue gas throughout the third toe extending into the plantar soft tissues underlying the second and third toes. Osteolysis involving the distal tuft of the distal phalanx. No evidence of acute or subacute fracture or dislocation. Joint spaces relatively well-preserved throughout. Small plantar calcaneal spur. IMPRESSION: 1. Osteomyelitis involving the distal phalanx of the third toe. 2. Extensive soft tissue gas throughout the third toe extending into the plantar soft tissues underlying the second and third toes, indicating a gas-forming organism. Electronically Signed   By: Evangeline Dakin M.D.   On: 02/22/2017 21:59     Scheduled Meds: . docusate sodium  100 mg Oral BID  . escitalopram  10 mg Oral Daily  . feeding supplement (GLUCERNA SHAKE)  237 mL Oral BID BM  . insulin aspart  0-9 Units Subcutaneous TID WC  . insulin glargine  20 Units  Subcutaneous Q2200   Continuous Infusions: . sodium chloride 100 mL/hr at 02/23/17 0621  . sodium chloride    . lactated ringers 10 mL/hr at 02/23/17 1348  . methocarbamol (ROBAXIN)  IV    . piperacillin-tazobactam (ZOSYN)  IV 3.375 g (02/24/17 0518)  . vancomycin       LOS: 2 days    Time spent: 30 min    Kayleen Memos, MD [facility] Pager [phone]  If 7PM-7AM, please contact night-coverage [email or web address] Password [facility] 02/24/2017, 8:17 AM

## 2017-02-24 NOTE — Evaluation (Signed)
Physical Therapy Evaluation Patient Details Name: Gina [name] MRN: [id number] DOB: 02-25-1961 Today's Date: 02/24/2017   History of Present Illness  Patient had right 3rd toe amputation perfromed on12/14. She previously has had all of her metatarsels removed on the left foot. PMH: Scizophrenia; Diabetes, HTN   Clinical Impression  Patients mobility is limited but she was able to stand and ambulate enough to get to a wheelchair. She was able to follow NWB restrictions on the right. She will require 24 hour assistance at home. She has a wheelchair at home. She has an old walker but does not feel that it is safe. She would benefit from further skilled acute therapy as well as home health therapy at discharge.     Follow Up Recommendations Home health PT; 24hour supervision     Equipment Recommendations  Rolling walker with 5" wheels;Other (comment)(surgical shoe )    Recommendations for Other Services       Precautions / Restrictions Restrictions Weight Bearing Restrictions: Yes RLE Weight Bearing: Non weight bearing      Mobility  Bed Mobility Overal bed mobility: Modified Independent                Transfers Overall transfer level: Needs assistance Equipment used: Rolling walker (2 wheeled) Transfers: Sit to/from Stand Sit to Stand: Min assist         General transfer comment: Min a for strength. Once standing supervision for balance.   Ambulation/Gait Ambulation/Gait assistance: Min guard Ambulation Distance (Feet): 6 Feet Assistive device: Rolling walker (2 wheeled) Gait Pattern/deviations: Step-to pattern   Gait velocity interpretation: <1.8 ft/sec, indicative of risk for recurrent falls General Gait Details: {atient able to hop up and down the edge of the bed without increased pain and while following weight bearing percuations   Stairs            Wheelchair Mobility    Modified Rankin (Stroke Patients Only)       Balance                                              Pertinent Vitals/Pain Pain Assessment: Faces Faces Pain Scale: Hurts even more Pain Location: right foot  Pain Descriptors / Indicators: Aching;Burning Pain Intervention(s): Limited activity within patient's tolerance;Monitored during session;Premedicated before session;Utilized relaxation techniques    Home Living Family/patient expects to be discharged to:: Private residence Living Arrangements: Alone Available Help at Discharge: Family;Available PRN/intermittently   Home Access: Level entry     Home Layout: One level Home Equipment: Wheelchair - manual Additional Comments: reports she has a walker but feels like it is unsafe    Prior Function Level of Independence: Independent         Comments: Mobility limited by the toe but was able to get around      Hand Dominance   Dominant Hand: Right    Extremity/Trunk Assessment   Upper Extremity Assessment Upper Extremity Assessment: Overall WFL for tasks assessed    Lower Extremity Assessment Lower Extremity Assessment: LLE deficits/detail;Generalized weakness LLE: Unable to fully assess due to pain       Communication   Communication: No difficulties  Cognition Arousal/Alertness: Awake/alert Behavior During Therapy: WFL for tasks assessed/performed Overall Cognitive Status: Within Functional Limits for tasks assessed  General Comments General comments (skin integrity, edema, etc.): left foot wrapped     Exercises     Assessment/Plan    PT Assessment Patient needs continued PT services  PT Problem List Decreased strength;Decreased range of motion;Decreased activity tolerance;Decreased balance;Decreased mobility;Decreased coordination;Decreased knowledge of use of DME;Pain       PT Treatment Interventions DME instruction;Gait training;Stair training;Functional mobility training;Therapeutic  activities;Therapeutic exercise;Balance training;Neuromuscular re-education    PT Goals (Current goals can be found in the Care Plan section)  Acute Rehab PT Goals Patient Stated Goal: to go home  PT Goal Formulation: With patient Time For Goal Achievement: 03/03/17 Potential to Achieve Goals: Good    Frequency Min 3X/week   Barriers to discharge   Patient will require 24 hour assist     Co-evaluation               AM-PAC PT "6 Clicks" Daily Activity  Outcome Measure Difficulty turning over in bed (including adjusting bedclothes, sheets and blankets)?: A Little Difficulty moving from lying on back to sitting on the side of the bed? : A Little Difficulty sitting down on and standing up from a chair with arms (e.g., wheelchair, bedside commode, etc,.)?: A Little Help needed moving to and from a bed to chair (including a wheelchair)?: A Little Help needed walking in hospital room?: A Lot Help needed climbing 3-5 steps with a railing? : Total 6 Click Score: 15    End of Session Equipment Utilized During Treatment: Gait belt Activity Tolerance: Patient limited by pain Patient left: in bed;with call bell/phone within reach;with family/visitor present(declined OOB to chair ) Nurse Communication: Mobility status PT Visit Diagnosis: Unsteadiness on feet (R26.81);Muscle weakness (generalized) (M62.81);Pain Pain - Right/Left: Right Pain - part of body: Ankle and joints of foot    Time: 1035-1059 PT Time Calculation (min) (ACUTE ONLY): 24 min   Charges:   PT Evaluation $PT Eval Moderate Complexity: 1 Mod     PT G Codes:         Carney Living PT DPT  02/24/2017, 2:00 PM

## 2017-02-25 DIAGNOSIS — L02611 Cutaneous abscess of right foot: Secondary | ICD-10-CM

## 2017-02-25 DIAGNOSIS — Z8249 Family history of ischemic heart disease and other diseases of the circulatory system: Secondary | ICD-10-CM

## 2017-02-25 DIAGNOSIS — Z8042 Family history of malignant neoplasm of prostate: Secondary | ICD-10-CM

## 2017-02-25 DIAGNOSIS — B9689 Other specified bacterial agents as the cause of diseases classified elsewhere: Secondary | ICD-10-CM

## 2017-02-25 DIAGNOSIS — Z89421 Acquired absence of other right toe(s): Secondary | ICD-10-CM

## 2017-02-25 DIAGNOSIS — E1169 Type 2 diabetes mellitus with other specified complication: Secondary | ICD-10-CM

## 2017-02-25 DIAGNOSIS — M86171 Other acute osteomyelitis, right ankle and foot: Secondary | ICD-10-CM

## 2017-02-25 DIAGNOSIS — F319 Bipolar disorder, unspecified: Secondary | ICD-10-CM

## 2017-02-25 DIAGNOSIS — Z833 Family history of diabetes mellitus: Secondary | ICD-10-CM

## 2017-02-25 DIAGNOSIS — Z888 Allergy status to other drugs, medicaments and biological substances status: Secondary | ICD-10-CM

## 2017-02-25 LAB — GLUCOSE, CAPILLARY
GLUCOSE-CAPILLARY: 229 mg/dL — AB (ref 65–99)
GLUCOSE-CAPILLARY: 283 mg/dL — AB (ref 65–99)
GLUCOSE-CAPILLARY: 301 mg/dL — AB (ref 65–99)
Glucose-Capillary: 212 mg/dL — ABNORMAL HIGH (ref 65–99)
Glucose-Capillary: 245 mg/dL — ABNORMAL HIGH (ref 65–99)

## 2017-02-25 LAB — CBC
HCT: 28 % — ABNORMAL LOW (ref 36.0–46.0)
HEMOGLOBIN: 9 g/dL — AB (ref 12.0–15.0)
MCH: 29.8 pg (ref 26.0–34.0)
MCHC: 32.1 g/dL (ref 30.0–36.0)
MCV: 92.7 fL (ref 78.0–100.0)
Platelets: 248 10*3/uL (ref 150–400)
RBC: 3.02 MIL/uL — AB (ref 3.87–5.11)
RDW: 12.9 % (ref 11.5–15.5)
WBC: 8.9 10*3/uL (ref 4.0–10.5)

## 2017-02-25 LAB — CREATININE, SERUM
Creatinine, Ser: 0.6 mg/dL (ref 0.44–1.00)
GFR calc non Af Amer: >60 mL/min (ref >60)

## 2017-02-25 LAB — VANCOMYCIN, TROUGH: Vancomycin Tr: 6 ug/mL — ABNORMAL LOW (ref 15–20)

## 2017-02-25 MED ORDER — VANCOMYCIN HCL IN DEXTROSE 750-5 MG/150ML-% IV SOLN
750.0000 mg | Freq: Two times a day (BID) | INTRAVENOUS | Status: DC
  Administered 2017-02-26: 750 mg via INTRAVENOUS
  Filled 2017-02-25 (×3): qty 150

## 2017-02-25 MED ORDER — VANCOMYCIN HCL 10 G IV SOLR
1250.0000 mg | INTRAVENOUS | Status: AC
  Administered 2017-02-25: 1250 mg via INTRAVENOUS
  Filled 2017-02-25: qty 1250

## 2017-02-25 NOTE — Progress Notes (Signed)
ANTIBIOTIC CONSULT NOTE - INITIAL  Pharmacy Consult for Vanco/Zosyn Indication: osteo  Allergies  Allergen Reactions  . Metformin And Related Other (See Comments)    Upset stomach    Patient Measurements: Height: 5' 2.01" (157.5 cm) Weight: 129 lb (58.5 kg) IBW/kg (Calculated) : 50.12 Adjusted Body Weight:    Vital Signs: Temp: 98.3 F (36.8 C) (12/16 0422) Temp Source: Oral (12/16 0422) BP: 154/78 (12/16 0422) Pulse Rate: 79 (12/16 0422) Intake/Output from previous day: 12/15 0701 - 12/16 0700 In: 1160 [P.O.:960; IV Piggyback:200] Out: -  Intake/Output from this shift: No intake/output data recorded.  Labs: Recent Labs    02/22/17 2128 02/23/17 0602 02/24/17 0532 02/25/17 0702  WBC 13.2* 11.9*  --  8.9  HGB 10.7* 9.5*  --  9.0*  PLT 242 179  --  248  CREATININE 0.66 0.43* 0.61 0.60   Estimated Creatinine Clearance: 62.8 mL/min (by C-G formula based on SCr of 0.6 mg/dL). No results for input(s): VANCOTROUGH, VANCOPEAK, VANCORANDOM, GENTTROUGH, GENTPEAK, GENTRANDOM, TOBRATROUGH, TOBRAPEAK, TOBRARND, AMIKACINPEAK, AMIKACINTROU, AMIKACIN in the last 72 hours.   Microbiology:   Medical History: Past Medical History:  Diagnosis Date  . Diabetes mellitus   . Hypertension   . Schizophrenia ([place])    Assessment: ID: r/o Osteo R foot. Afebrile. WBC down to 8.9. ESR 105 and CRP 7.7 (02/22/17). Scr WNL. Vanco trough low for indication. 12/15: R 3rd toe amputation  Antimicrobials this admission: 12/13 zosyn >>  12/13 vancomycin >>   Dose adjustments this admission: 12/16 VT 6:   Microbiology results: 12/14 BC x 2>> 12/14: R toe abscess: GPC, GNR  Goal of Therapy:  Vancomycin trough level 15-20 mcg/ml  Plan:  Vanco1252m IV x 1 now, then increase to 7561mIV q 12h Zosyn 3.375g IV q 8 hr   Safaa Stingley S. RoAlford HighlandPharmD, BCPS Clinical Staff Pharmacist Pager [phone] Ghazitillinger 02/25/2017,10:59 AM

## 2017-02-25 NOTE — Plan of Care (Signed)
  Progressing Coping: Level of anxiety will decrease 02/25/2017 0332 - Progressing by West Pugh, RN Pain Managment: General experience of comfort will improve 02/25/2017 0332 - Progressing by West Pugh, RN Safety: Ability to remain free from injury will improve 02/25/2017 0332 - Progressing by West Pugh, RN Skin Integrity: Risk for impaired skin integrity will decrease 02/25/2017 0332 - Progressing by West Pugh, RN

## 2017-02-25 NOTE — Plan of Care (Signed)
  Elimination: Will not experience complications related to bowel motility 02/25/2017 1049 - Progressing by Williams Che, RN   Pain Managment: General experience of comfort will improve 02/25/2017 1049 - Progressing by Williams Che, RN   Safety: Ability to remain free from injury will improve 02/25/2017 1049 - Progressing by Williams Che, RN

## 2017-02-25 NOTE — Progress Notes (Signed)
PROGRESS NOTE  Gina [name]  DVV:[id number] DOB: 06/08/1960 DOA: 02/22/2017 PCP: Alfonse Spruce, FNP  Brief Narrative:   Gina [name] is a 56 y.o. female with history of poorly controlled diabetes, hypertension, schizophrenia, anemia, s/p of left Transmetatarsal amputation, medication noncompliance, who presented with right foot swelling, pain, fever and chills.  She was found to have osteomyelitis of the distal phalanx of the right third toe. vancomycin and Zosyn day #3.  POD #3 post right 3rd toe amputation.  Per orthopedic surgery, the patient will need 3 days of IV antibiotics and 4 weeks of oral antibiotics after discharge. Will discharge to home possibly on Monday if safe with a walker per orthopedic surgery. Will consult ID for oral antibiotic recommendation in the setting of osteomyelitis post amputation.  No acute events reported overnight. Pt seen and examined at her bedside. She reports throbbing pain at her right foot 5-6/10 non nradiating. No cardiopulmonary symptoms.    Assessment & Plan:  POD #3 post right 3rd toe amputation due to osteomyelitis of right foot (HCC) - Continue vancomycin and Zosyn day #3 - PRN Zofran for nausea, and Percocet for pain - Blood cultures x 2 no growth 1 day - ESR 105 and CRP 7.7 (02/22/17) - post right 3rd toe amputation. - ortho recommends 3 days IV antibiotics and 4 weks po antibiotics - ID consulted for po meds recommendations and for possible follow up in the outpatient setting. - ID recommends augmentin 875 mg BID x 4 weeks at discharge. We appreciate recommendations  HTN -Blood pressure well controlled -noncompliant with home medications. -IVF hydralazine prn-has not required -not on po antihypertensive  DM (diabetes mellitus), type 2, uncontrolled, periph vascular complication ([place]): -Last A1c 14.3 on 06/01/16 -Metformin, NovoLog and Lantus -Lantus 20 units daily -SSI with hypoglycemic protocol -A1c 15.4  (02/22/17)  Anemia of chronic disease/blood loss -hg 9.0 from 9.5 from 10.7 -baseline hg 11 -CBC am  Schizophrenia ([place]):  -on Lexapro, but not taking medications currently -Resume Lexapro  DVT ppx: SCDs; defer to orthopedic surgery for chemical DVT ppx Code Status: Full code Family Communication:  Family member at bedside. Disposition Plan:  will stay another midnight to continue current management, IV antibiotics. Will start augmentin tomorrow with possible discharge to home.  Consultants:   Dr. Sharol Given, orthopedic surgery  ID  Procedures:  Post right 3rd toe amputation POD # 3  Antimicrobials:  Anti-infectives (From admission, onward)   Start     Dose/Rate Route Frequency Ordered Stop   02/23/17 1400  vancomycin (VANCOCIN) IVPB 1000 mg/200 mL premix     1,000 mg 200 mL/hr over 60 Minutes Intravenous Every 24 hours 02/23/17 0011     02/23/17 0600  piperacillin-tazobactam (ZOSYN) IVPB 3.375 g     3.375 g 12.5 mL/hr over 240 Minutes Intravenous Every 8 hours 02/23/17 0011     02/22/17 2130  vancomycin (VANCOCIN) IVPB 1000 mg/200 mL premix     1,000 mg 200 mL/hr over 60 Minutes Intravenous  Once 02/22/17 2128 02/23/17 0004   02/22/17 2130  piperacillin-tazobactam (ZOSYN) IVPB 3.375 g     3.375 g 100 mL/hr over 30 Minutes Intravenous  Once 02/22/17 2128 02/22/17 2252       Subjective:  Denies subjective fevers or chills.  Having some pain in the right foot.  Denies nausea, vomiting.  She has been eating some ice chips and having some sips of water.  Objective: Vitals:   02/24/17 1300 02/24/17 1500 02/24/17 2011 02/25/17 0422  BP: 131/69 119/73 115/67 (!) 154/78  Pulse: 94 83 89 79  Resp: 18 18    Temp: 98.3 F (36.8 C) 97.6 F (36.4 C) 99 F (37.2 C) 98.3 F (36.8 C)  TempSrc: Oral Axillary Oral Oral  SpO2: 97% 96% 93% 96%  Weight:      Height:        Intake/Output Summary (Last 24 hours) at 02/25/2017 0759 Last data filed at 02/24/2017 1751 Gross per  24 hour  Intake 1160 ml  Output -  Net 1160 ml   Filed Weights   02/23/17 0005 02/23/17 1346  Weight: 58.5 kg (129 lb) 58.5 kg (129 lb)    Examination:  General exam:  Adult female.  No acute distress.  HEENT:  NCAT, MMM Respiratory system: Clear to auscultation bilaterally Cardiovascular system: Regular rate and rhythm, normal S1/S2. No murmurs, rubs, gallops or clicks.  Warm extremities Gastrointestinal system: Normal active bowel sounds, soft, nondistended, nontender. MSK: right foot wrapped in surgical dressing. Left foot is partially amputated with no lesion appearing. Neuro: Decreased sensation over bilateral feet    Data Reviewed: I have personally reviewed following labs and imaging studies  CBC: Recent Labs  Lab 02/20/17 1926 02/22/17 2128 02/23/17 0602  WBC 10.3 13.2* 11.9*  NEUTROABS  --  10.8*  --   HGB 11.4* 10.7* 9.5*  HCT 34.2* 31.9* 28.9*  MCV 92.4 90.6 91.7  PLT 230 242 888   Basic Metabolic Panel: Recent Labs  Lab 02/20/17 1926 02/22/17 2128 02/23/17 0602 02/24/17 0532  NA 125* 132* 135  --   K 3.9 4.8 3.8  --   CL 90* 99* 105  --   CO2 26 25 23   --   GLUCOSE 787* 439* 301*  --   BUN 9 14 11   --   CREATININE 0.73 0.66 0.43* 0.61  CALCIUM 9.2 8.7* 8.2*  --    GFR: Estimated Creatinine Clearance: 62.8 mL/min (by C-G formula based on SCr of 0.61 mg/dL). Liver Function Tests: Recent Labs  Lab 02/20/17 1926  AST 27  ALT 30  ALKPHOS 103  BILITOT 0.5  PROT 7.6  ALBUMIN 3.4*   No results for input(s): LIPASE, AMYLASE in the last 168 hours. No results for input(s): AMMONIA in the last 168 hours. Coagulation Profile: Recent Labs  Lab 02/22/17 2359  INR 1.02   Cardiac Enzymes: No results for input(s): CKTOTAL, CKMB, CKMBINDEX, TROPONINI in the last 168 hours. BNP (last 3 results) No results for input(s): PROBNP in the last 8760 hours. HbA1C: Recent Labs    02/22/17 2204  HGBA1C 15.4*   CBG: Recent Labs  Lab 02/24/17 0734  02/24/17 1134 02/24/17 1644 02/24/17 2017 02/25/17 0647  GLUCAP 111* 223* 278* 306* 229*   Lipid Profile: No results for input(s): CHOL, HDL, LDLCALC, TRIG, CHOLHDL, LDLDIRECT in the last 72 hours. Thyroid Function Tests: No results for input(s): TSH, T4TOTAL, FREET4, T3FREE, THYROIDAB in the last 72 hours. Anemia Panel: No results for input(s): VITAMINB12, FOLATE, FERRITIN, TIBC, IRON, RETICCTPCT in the last 72 hours. Urine analysis:    Component Value Date/Time   COLORURINE STRAW (A) 02/20/2017 2359   APPEARANCEUR CLEAR 02/20/2017 2359   LABSPEC 1.030 02/20/2017 2359   PHURINE 6.0 02/20/2017 2359   GLUCOSEU >=500 (A) 02/20/2017 2359   HGBUR SMALL (A) 02/20/2017 2359   BILIRUBINUR NEGATIVE 02/20/2017 2359   BILIRUBINUR N 04/28/2016 1603   KETONESUR NEGATIVE 02/20/2017 2359   PROTEINUR NEGATIVE 02/20/2017 2359   UROBILINOGEN 0.2 04/28/2016 1603  UROBILINOGEN 0.2 06/18/2013 0650   NITRITE NEGATIVE 02/20/2017 2359   LEUKOCYTESUR NEGATIVE 02/20/2017 2359   Sepsis Labs: @LABRCNTIP (procalcitonin:4,lacticidven:4)  ) Recent Results (from the past 240 hour(s))  Culture, blood (x 2)     Status: None (Preliminary result)   Collection Time: 02/23/17 12:06 AM  Result Value Ref Range Status   Specimen Description BLOOD LEFT ANTECUBITAL  Final   Special Requests   Final    BOTTLES DRAWN AEROBIC AND ANAEROBIC Blood Culture adequate volume   Culture   Final    NO GROWTH 1 DAY Performed at [facility], [place] [address] [place], [place] [address]    Report Status PENDING  Incomplete  Culture, blood (x 2)     Status: None (Preliminary result)   Collection Time: 02/23/17  6:02 AM  Result Value Ref Range Status   Specimen Description BLOOD RIGHT ANTECUBITAL  Final   Special Requests   Final    BOTTLES DRAWN AEROBIC AND ANAEROBIC Blood Culture adequate volume   Culture   Final    NO GROWTH 1 DAY Performed at [facility], [place] [address] [place], [place] [address]     Report Status PENDING  Incomplete  Aerobic/Anaerobic Culture (surgical/deep wound)     Status: None (Preliminary result)   Collection Time: 02/23/17  3:41 PM  Result Value Ref Range Status   Specimen Description ABSCESS RIGHT TOE  Final   Special Requests THIRD TOE PATIENT ON VANCOMYCIN  Final   Gram Stain   Final    ABUNDANT WBC PRESENT, PREDOMINANTLY PMN ABUNDANT GRAM POSITIVE COCCI FEW GRAM NEGATIVE RODS    Culture CULTURE REINCUBATED FOR BETTER GROWTH  Final   Report Status PENDING  Incomplete      Radiology Studies: No results found.   Scheduled Meds: . docusate sodium  100 mg Oral BID  . escitalopram  10 mg Oral Daily  . feeding supplement (GLUCERNA SHAKE)  237 mL Oral BID BM  . insulin aspart  0-9 Units Subcutaneous TID WC  . insulin glargine  20 Units Subcutaneous Q2200   Continuous Infusions: . sodium chloride 100 mL/hr at 02/23/17 0621  . sodium chloride    . lactated ringers 10 mL/hr at 02/23/17 1348  . methocarbamol (ROBAXIN)  IV    . piperacillin-tazobactam (ZOSYN)  IV Stopped (02/25/17 0200)  . vancomycin Stopped (02/24/17 1635)     LOS: 3 days    Time spent: 30 min    Kayleen Memos, MD [facility] Pager [phone]  If 7PM-7AM, please contact night-coverage [email or web address] Password [email or web address] 02/25/2017, 7:59 AM

## 2017-02-25 NOTE — Consult Note (Signed)
[facility] for Infectious Disease    Date of Admission:  02/22/2017           Day 4 vancomycin        Day 4 piperacillin tazobactam       Reason for Consult: Right diabetic foot infection with osteomyelitis and abscess    Referring Provider: Dr. Francia Greaves  Assessment: She has a mixed aerobic, anaerobic diabetic foot infection. She tells me that she lives alone hearing Gina [name] but gets help from neighbors and her family. She has not a good candidate for outpatient IV antibiotic therapy. I would transition to oral amoxicillin clavulanate on discharge and plan on at least 4 weeks of total therapy.   Plan: 1. Changed to amoxicillin clavulanate 875 mg twice daily on discharge 2. Please call if we can be of further assistance   Principal Problem:   Osteomyelitis of right foot ([place]) Active Problems:   HTN (hypertension)   DM (diabetes mellitus), type 2, uncontrolled, periph vascular complic (HCC)   Anemia of chronic disease   S/P transmetatarsal amputation of foot, left (HCC)   Sepsis (HCC)   Schizophrenia ([place])   Osteomyelitis of toe (HCC)   Scheduled Meds: . docusate sodium  100 mg Oral BID  . escitalopram  10 mg Oral Daily  . feeding supplement (GLUCERNA SHAKE)  237 mL Oral BID BM  . insulin aspart  0-9 Units Subcutaneous TID WC  . insulin glargine  20 Units Subcutaneous Q2200   Continuous Infusions: . sodium chloride 100 mL/hr at 02/23/17 0621  . sodium chloride    . lactated ringers 10 mL/hr at 02/23/17 1348  . methocarbamol (ROBAXIN)  IV    . piperacillin-tazobactam (ZOSYN)  IV Stopped (02/25/17 0200)  . vancomycin    . [START ON 02/26/2017] vancomycin     PRN Meds:.acetaminophen **OR** acetaminophen, bisacodyl, hydrALAZINE, magnesium citrate, methocarbamol **OR** methocarbamol (ROBAXIN)  IV, metoCLOPramide **OR** metoCLOPramide (REGLAN) injection, ondansetron **OR** ondansetron (ZOFRAN) IV, oxyCODONE, oxyCODONE-acetaminophen, polyethylene glycol,  zolpidem  HPI: Gina [name] is a 56 y.o. female with poorly controlled diabetes and bipolar disorder. She was brought to the emergency department on 02/20/2017 because of paranoia. Involuntary commitment was considered but then rescinded and she was discharged the following day. She returned and was admitted 4 days ago when her family realized that she had a swollen painful right foot. [name] says that she has had problems with her foot for about 1 week. An x-ray revealed osteomyelitis involving the distal phalanx of the third toe of her right foot. She underwent amputation of her third toe and metatarsal ray resection. Operative Gram stain shows gram-positive cocci and gram-negative rods. Cultures are growing an anaerobe.   Review of Systems: Review of Systems  Unable to perform ROS: Mental acuity    Past Medical History:  Diagnosis Date  . Diabetes mellitus   . Hypertension   . Schizophrenia ([place])     Social History   Tobacco Use  . Smoking status: Never Smoker  . Smokeless tobacco: Never Used  Substance Use Topics  . Alcohol use: No    Comment: occasionally  . Drug use: No    Family History  Problem Relation Age of Onset  . Diabetes type II Father   . CAD Father   . Prostate cancer Father   . Diabetes Mellitus II Brother    Allergies  Allergen Reactions  . Metformin And Related Other (See Comments)    Upset stomach  OBJECTIVE: Blood pressure (!) 154/78, pulse 79, temperature 98.3 F (36.8 C), temperature source Oral, resp. rate 18, height 5' 2.01" (1.575 m), weight 129 lb (58.5 kg), SpO2 96 %.  Physical Exam  Constitutional:  She is on the telephone trying to convince someone from food services to bring her some bread so she can make a sandwich.  Cardiovascular: Normal rate and regular rhythm.  No murmur heard. Pulmonary/Chest: Effort normal and breath sounds normal.  Abdominal: Soft. There is no tenderness.  Musculoskeletal:  Her right foot is  wrapped.  Neurological: She is alert.    Lab Results Lab Results  Component Value Date   WBC 8.9 02/25/2017   HGB 9.0 (L) 02/25/2017   HCT 28.0 (L) 02/25/2017   MCV 92.7 02/25/2017   PLT 248 02/25/2017    Lab Results  Component Value Date   CREATININE 0.60 02/25/2017   BUN 11 02/23/2017   NA 135 02/23/2017   K 3.8 02/23/2017   CL 105 02/23/2017   CO2 23 02/23/2017    Lab Results  Component Value Date   ALT 30 02/20/2017   AST 27 02/20/2017   ALKPHOS 103 02/20/2017   BILITOT 0.5 02/20/2017     Microbiology: Recent Results (from the past 240 hour(s))  Culture, blood (x 2)     Status: None (Preliminary result)   Collection Time: 02/23/17 12:06 AM  Result Value Ref Range Status   Specimen Description BLOOD LEFT ANTECUBITAL  Final   Special Requests   Final    BOTTLES DRAWN AEROBIC AND ANAEROBIC Blood Culture adequate volume   Culture   Final    NO GROWTH 2 DAYS Performed at [facility], [address], [place], [place] [address]    Report Status PENDING  Incomplete  Culture, blood (x 2)     Status: None (Preliminary result)   Collection Time: 02/23/17  6:02 AM  Result Value Ref Range Status   Specimen Description BLOOD RIGHT ANTECUBITAL  Final   Special Requests   Final    BOTTLES DRAWN AEROBIC AND ANAEROBIC Blood Culture adequate volume   Culture   Final    NO GROWTH 2 DAYS Performed at [facility], [place] [address], [place], [place] [address]    Report Status PENDING  Incomplete  Aerobic/Anaerobic Culture (surgical/deep wound)     Status: None (Preliminary result)   Collection Time: 02/23/17  3:41 PM  Result Value Ref Range Status   Specimen Description ABSCESS RIGHT TOE  Final   Special Requests THIRD TOE PATIENT ON VANCOMYCIN  Final   Gram Stain   Final    ABUNDANT WBC PRESENT, PREDOMINANTLY PMN ABUNDANT GRAM POSITIVE COCCI FEW GRAM NEGATIVE RODS    Culture   Final    CULTURE REINCUBATED FOR BETTER GROWTH HOLDING FOR POSSIBLE ANAEROBE     Report Status PENDING  Incomplete    Michel Bickers, [facility] [phone] pager   [phone] cell 02/25/2017, 2:16 PM

## 2017-02-25 NOTE — Progress Notes (Signed)
CSW acknowledging consult for Medicaid establishment. CSW reviewed patient's billing account and noted that a financial counselor has already met with the patient on 02/22/17 to complete any Medicaid documents that would have been appropriate to her current situation and will continue to follow to provide assistance as needed and able.  CSW signing off, as financial counseling intervention has already been provided to patient.   Laveda Abbe, [facility] Clinical Social Worker [phone]

## 2017-02-26 ENCOUNTER — Encounter (HOSPITAL_COMMUNITY): Payer: Self-pay | Admitting: Orthopedic Surgery

## 2017-02-26 DIAGNOSIS — E1165 Type 2 diabetes mellitus with hyperglycemia: Secondary | ICD-10-CM

## 2017-02-26 DIAGNOSIS — Z89432 Acquired absence of left foot: Secondary | ICD-10-CM

## 2017-02-26 DIAGNOSIS — E1151 Type 2 diabetes mellitus with diabetic peripheral angiopathy without gangrene: Secondary | ICD-10-CM

## 2017-02-26 DIAGNOSIS — A419 Sepsis, unspecified organism: Principal | ICD-10-CM

## 2017-02-26 DIAGNOSIS — D638 Anemia in other chronic diseases classified elsewhere: Secondary | ICD-10-CM

## 2017-02-26 DIAGNOSIS — I10 Essential (primary) hypertension: Secondary | ICD-10-CM

## 2017-02-26 DIAGNOSIS — F209 Schizophrenia, unspecified: Secondary | ICD-10-CM

## 2017-02-26 LAB — CREATININE, SERUM
Creatinine, Ser: 0.65 mg/dL (ref 0.44–1.00)
GFR calc Af Amer: >60 mL/min (ref >60)
GFR calc non Af Amer: >60 mL/min (ref >60)

## 2017-02-26 LAB — CBC
HEMATOCRIT: 28.8 % — AB (ref 36.0–46.0)
HEMOGLOBIN: 9.3 g/dL — AB (ref 12.0–15.0)
MCH: 29.7 pg (ref 26.0–34.0)
MCHC: 32.3 g/dL (ref 30.0–36.0)
MCV: 92 fL (ref 78.0–100.0)
Platelets: 272 10*3/uL (ref 150–400)
RBC: 3.13 MIL/uL — ABNORMAL LOW (ref 3.87–5.11)
RDW: 12.9 % (ref 11.5–15.5)
WBC: 8.5 10*3/uL (ref 4.0–10.5)

## 2017-02-26 LAB — GLUCOSE, CAPILLARY
Glucose-Capillary: 252 mg/dL — ABNORMAL HIGH (ref 65–99)
Glucose-Capillary: 338 mg/dL — ABNORMAL HIGH (ref 65–99)
Glucose-Capillary: 222 mg/dL — ABNORMAL HIGH (ref 65–99)

## 2017-02-26 MED ORDER — INSULIN STARTER KIT- PEN NEEDLES (ENGLISH): 1.0000 | Freq: Once | 0 refills | Status: AC

## 2017-02-26 MED ORDER — LIVING WELL WITH DIABETES BOOK
Freq: Once | Status: AC
  Administered 2017-02-26: 12:00:00
  Filled 2017-02-26: qty 1

## 2017-02-26 MED ORDER — INSULIN ASPART 100 UNIT/ML ~~LOC~~ SOLN: 0.0000 [IU] | Freq: Three times a day (TID) | SUBCUTANEOUS | 0 refills | Status: DC

## 2017-02-26 MED ORDER — AMOXICILLIN-POT CLAVULANATE 875-125 MG PO TABS: 1.0000 | ORAL_TABLET | Freq: Two times a day (BID) | ORAL | 0 refills | Status: DC

## 2017-02-26 MED ORDER — INSULIN STARTER KIT- PEN NEEDLES (ENGLISH)
1.0000 | Freq: Once | Status: AC
  Administered 2017-02-26: 1
  Filled 2017-02-26: qty 1

## 2017-02-26 MED ORDER — BLOOD GLUCOSE MONITOR KIT: PACK | 0 refills | Status: DC

## 2017-02-26 MED ORDER — INSULIN GLARGINE 100 UNIT/ML ~~LOC~~ SOLN: 20.0000 [IU] | Freq: Every day | SUBCUTANEOUS | 0 refills | Status: DC

## 2017-02-26 NOTE — Discharge Summary (Addendum)
Discharge Summary  Gina [name] QZE:[id number] DOB: March 29, 1960  PCP: Alfonse Spruce, FNP  Admit date: 02/22/2017 Discharge date: 02/26/2017  Time spent: 25 minutes  Recommendations for Outpatient Follow-up:  1. Follow up with your PCP within a week 2. Follow up with Dr. Sharol Given in 1 week 3. Will need to be non bearing on the right foot 4. Take your medications as prescribed 5. Follow a diabetic diet, low in simple carbohydrates 6. Complete po Augmentin for 30 days 7. Probiotics since on long term po antibiotic   Discharge Diagnoses:  Active Hospital Problems   Diagnosis Date Noted  . Osteomyelitis of right foot ([place]) 02/22/2017  . Osteomyelitis of toe ([place])   . Sepsis ([place]) 02/22/2017  . Schizophrenia ([place])   . S/P transmetatarsal amputation of foot, left ([place]) 06/15/2016  . DM (diabetes mellitus), type 2, uncontrolled, periph vascular complic ([place]) 22/63/3354  . Anemia of chronic disease 06/07/2016  . HTN (hypertension) 06/18/2013    Resolved Hospital Problems  No resolved problems to display.    Discharge Condition: Stable   Diet recommendation: Diabetic diet  Vitals:   02/25/17 2037 02/26/17 0655  BP: (!) 106/57 (!) 156/84  Pulse: 86 74  Resp: 18 18  Temp: 98.4 F (36.9 C) 98.5 F (36.9 C)  SpO2: 100% 100%    History of present illness:  Gina [name] a 56 y.o.femalewith history ofpoorly controlled diabetes, hypertension, schizophrenia, anemia, s/p of left Transmetatarsal amputation, medication noncompliance, who presented with right foot swelling, pain, fever and chills.  She was found to haveosteomyelitis of the distal phalanx of the right third toe. vancomycin and Zosyn day #3.  POD #4 post right 3rd toe amputation.  Per orthopedic surgery, the patient received 3 days of IV antibiotics IV vancomycin and IV zosyn and will need 4 weeks of oral antibiotics after discharge, Augmentin as recommended by ID.   Hospital course complicated by  hyperglycemia in the setting of insulin dependent uncontrolledtype 2 diabetes. Patient non compliant with insulin therapy. Reinforced the importance of checking blood sugar prior to administering insulin.  On the day of discharge the patient was hemodynamically stable. She will need to follow up with her PCP for management of her diabetes A1C 15. Will need to follow up with orthopedic surgery Dr. Sharol Given for monitoring post surgery.   Hospital Course:  Principal Problem:   Osteomyelitis of right foot ([place]) Active Problems:   HTN (hypertension)   DM (diabetes mellitus), type 2, uncontrolled, periph vascular complic ([facility])   Anemia of chronic disease   S/P transmetatarsal amputation of foot, left ([facility])   Sepsis ([facility])   Schizophrenia ([facility])   Osteomyelitis of toe ([place])   Procedures: POD #3 post right 3rd toe amputation due to osteomyelitis of right foot   Consultations:  Orthopedic surgery  Diabetes coordinator  Discharge Exam: BP (!) 156/84 (BP Location: Left Arm)   Pulse 74   Temp 98.5 F (36.9 C) (Oral)   Resp 18   Ht 5' 2.01" (1.575 m)   Wt 58.5 kg (129 lb)   SpO2 100%   BMI 23.59 kg/m   General: 56 yo AAF WD WN NAD A&O x3  Cardiovascular: RRR no rubs or gallops  Respiratory: CTA no wheezes or rales  Discharge Instructions You were cared for by a hospitalist during your hospital stay. If you have any questions about your discharge medications or the care you received while you were in the hospital after you are discharged, you can call the unit and  asked to speak with the hospitalist on call if the hospitalist that took care of you is not available. Once you are discharged, your primary care physician will handle any further medical issues. Please note that NO REFILLS for any discharge medications will be authorized once you are discharged, as it is imperative that you return to your primary care physician (or establish a relationship with a primary care physician if you do  not have one) for your aftercare needs so that they can reassess your need for medications and monitor your lab values.   Allergies as of 02/26/2017      Reactions   Metformin And Related Other (See Comments)   Upset stomach      Medication List    STOP taking these medications   amLODipine 10 MG tablet Commonly known as:  NORVASC   Insulin Glargine 100 UNIT/ML Solostar Pen Commonly known as:  LANTUS Replaced by:  insulin glargine 100 UNIT/ML injection   metFORMIN 500 MG 24 hr tablet Commonly known as:  GLUCOPHAGE-XR   oxyCODONE 5 MG immediate release tablet Commonly known as:  Oxy IR/ROXICODONE     TAKE these medications   acetaminophen 325 MG tablet Commonly known as:  TYLENOL Take 2 tablets (650 mg total) by mouth every 6 (six) hours as needed for mild pain (or Fever >/= 101).   amoxicillin-clavulanate 875-125 MG tablet Commonly known as:  AUGMENTIN Take 1 tablet by mouth 2 (two) times daily.   bisacodyl 10 MG suppository Commonly known as:  DULCOLAX Place 1 suppository (10 mg total) rectally daily as needed for moderate constipation.   blood glucose meter kit and supplies Kit Dispense based on patient and insurance preference. Use up to four times daily as directed. (FOR ICD-9 250.00, 250.01).   docusate sodium 100 MG capsule Commonly known as:  COLACE Take 1 capsule (100 mg total) by mouth 2 (two) times daily.   escitalopram 10 MG tablet Commonly known as:  LEXAPRO Take 1 tablet (10 mg total) by mouth daily. After 1 week may increase take 2 tablets (20 mg total) by mouth daily.   ferrous sulfate 325 (65 FE) MG tablet Take 1 tablet (325 mg total) by mouth 2 (two) times daily with a meal.   insulin aspart 100 UNIT/ML injection Commonly known as:  novoLOG Inject 0-9 Units into the skin 3 (three) times daily with meals. What changed:    how much to take  Another medication with the same name was removed. Continue taking this medication, and follow the  directions you see here.   insulin glargine 100 UNIT/ML injection Commonly known as:  LANTUS Inject 0.2 mLs (20 Units total) into the skin daily at 10 pm. Replaces:  Insulin Glargine 100 UNIT/ML Solostar Pen   insulin starter kit- pen needles Misc 1 kit by Other route once for 1 dose.   lisinopril 2.5 MG tablet Commonly known as:  PRINIVIL,ZESTRIL Take 1 tablet (2.5 mg total) by mouth daily.   polyethylene glycol packet Commonly known as:  MIRALAX Take 17 g by mouth daily as needed for mild constipation.   senna-docusate 8.6-50 MG tablet Commonly known as:  Senokot-S Take 2 tablets by mouth daily.            Durable Medical Equipment  (From admission, onward)        Start     Ordered   02/26/17 1305  For home use only DME lightweight manual wheelchair with seat cushion  Once    Comments:  Patient suffers from amputation of right 3rd toe  which impairs their ability to perform daily activities like ambulating in the home.  A cane will not resolve  issue with performing activities of daily living. A wheelchair will allow patient to safely perform daily activities. Patient is not able to propel themselves in the home using a standard weight wheelchair due to genralized weakness. Patient can self propel in the lightweight wheelchair.  Accessories: elevating leg rests (ELRs), wheel locks, extensions and anti-tippers.   02/26/17 1306   02/26/17 1301  For home use only DME Walker rolling  Once    Question:  Patient needs a walker to treat with the following condition  Answer:  Unable to bear weight   02/26/17 1301     Allergies  Allergen Reactions  . Metformin And Related Other (See Comments)    Upset stomach   Follow-up Information    [name], MD Follow up in 1 week(s).   Specialty:  Orthopedic Surgery Contact information: [place] [address] [address] Go to.   Why:  your  hospital follow up appointment is scheduled for Wednesday, December 26,2018 Contact information: [address] [place] [address] [phone]           The results of significant diagnostics from this hospitalization (including imaging, microbiology, ancillary and laboratory) are listed below for reference.    Significant Diagnostic Studies: Dg Foot 2 Views Right  Result Date: 02/22/2017 CLINICAL DATA:  56 year old with a diabetic right foot infection. EXAM: RIGHT FOOT - 2 VIEW COMPARISON:  None. FINDINGS: Diffuse soft tissue swelling. Extensive soft tissue gas throughout the third toe extending into the plantar soft tissues underlying the second and third toes. Osteolysis involving the distal tuft of the distal phalanx. No evidence of acute or subacute fracture or dislocation. Joint spaces relatively well-preserved throughout. Small plantar calcaneal spur. IMPRESSION: 1. Osteomyelitis involving the distal phalanx of the third toe. 2. Extensive soft tissue gas throughout the third toe extending into the plantar soft tissues underlying the second and third toes, indicating a gas-forming organism. Electronically Signed   By: Evangeline Dakin M.D.   On: 02/22/2017 21:59    Microbiology: Recent Results (from the past 240 hour(s))  Culture, blood (x 2)     Status: None (Preliminary result)   Collection Time: 02/23/17 12:06 AM  Result Value Ref Range Status   Specimen Description BLOOD LEFT ANTECUBITAL  Final   Special Requests   Final    BOTTLES DRAWN AEROBIC AND ANAEROBIC Blood Culture adequate volume   Culture   Final    NO GROWTH 3 DAYS Performed at [facility], [address] [place], [place] [address]    Report Status PENDING  Incomplete  Culture, blood (x 2)     Status: None (Preliminary result)   Collection Time: 02/23/17  6:02 AM  Result Value Ref Range Status   Specimen Description BLOOD RIGHT ANTECUBITAL  Final   Special Requests   Final     BOTTLES DRAWN AEROBIC AND ANAEROBIC Blood Culture adequate volume   Culture   Final    NO GROWTH 3 DAYS Performed at [facility], [place] [address] [place], [place] [address]    Report Status PENDING  Incomplete  Aerobic/Anaerobic Culture (surgical/deep wound)     Status: None (Preliminary result)   Collection Time: 02/23/17  3:41 PM  Result  Value Ref Range Status   Specimen Description ABSCESS RIGHT TOE  Final   Special Requests THIRD TOE PATIENT ON VANCOMYCIN  Final   Gram Stain   Final    ABUNDANT WBC PRESENT, PREDOMINANTLY PMN ABUNDANT GRAM POSITIVE COCCI FEW GRAM NEGATIVE RODS    Culture   Final    RARE STAPHYLOCOCCUS AUREUS HOLDING FOR POSSIBLE ANAEROBE SUSCEPTIBILITIES TO FOLLOW    Report Status PENDING  Incomplete     Labs: Basic Metabolic Panel: Recent Labs  Lab 02/20/17 1926 02/22/17 2128 02/23/17 0602 02/24/17 0532 02/25/17 0702 02/26/17 0616  NA 125* 132* 135  --   --   --   K 3.9 4.8 3.8  --   --   --   CL 90* 99* 105  --   --   --   CO2 26 25 23   --   --   --   GLUCOSE 787* 439* 301*  --   --   --   BUN 9 14 11   --   --   --   CREATININE 0.73 0.66 0.43* 0.61 0.60 0.65  CALCIUM 9.2 8.7* 8.2*  --   --   --    Liver Function Tests: Recent Labs  Lab 02/20/17 1926  AST 27  ALT 30  ALKPHOS 103  BILITOT 0.5  PROT 7.6  ALBUMIN 3.4*   No results for input(s): LIPASE, AMYLASE in the last 168 hours. No results for input(s): AMMONIA in the last 168 hours. CBC: Recent Labs  Lab 02/20/17 1926 02/22/17 2128 02/23/17 0602 02/25/17 0702 02/26/17 0616  WBC 10.3 13.2* 11.9* 8.9 8.5  NEUTROABS  --  10.8*  --   --   --   HGB 11.4* 10.7* 9.5* 9.0* 9.3*  HCT 34.2* 31.9* 28.9* 28.0* 28.8*  MCV 92.4 90.6 91.7 92.7 92.0  PLT 230 242 179 248 272   Cardiac Enzymes: No results for input(s): CKTOTAL, CKMB, CKMBINDEX, TROPONINI in the last 168 hours. BNP: BNP (last 3 results) Recent Labs    04/28/16 1640  BNP 19.0    ProBNP (last 3 results) No  results for input(s): PROBNP in the last 8760 hours.  CBG: Recent Labs  Lab 02/25/17 1634 02/25/17 2041 02/25/17 2206 02/26/17 0659 02/26/17 1137  GLUCAP 301* 212* 245* 252* 338*       Signed:  Kayleen Memos, MD [facility] 02/26/2017, 3:36 PM

## 2017-02-26 NOTE — Care Management Note (Signed)
Case Management Note  Patient Details  Name: Gina [name] MRN: [id number] Date of Birth: [birth year]  Subjective/Objective:  56 yr old female admitted with osteo of right foot, s/p amputation of 3rd toe.                    Action/Plan: Case manager discharge  plan with patient. She does not qualify for Home Health services as charity. Case manager has arranged followup appointment for patient at [facility] on Wednesday, March 07, 2017 at 2:30pm. Case manager explained to patient that she should not cancel or miss this appointment. Patient has been no-show for 6 appointments over past few months and even more over past 6 months. Per [facility] patient received a  Wheelchair in March 2018, she is not eligble for another.   Expected Discharge Date:  02/26/17               Expected Discharge Plan:  Home/Self Care  In-House Referral:  NA  Discharge planning Services  CM Consult, [facility], [facility], Follow-up appt scheduled  Post Acute Care Choice:  Durable Medical Equipment Choice offered to:  Patient  DME Arranged:  3-N-1, Wheelchair manual DME Agency:  [facility]:  NA [place] Agency:  NA  Status of Service:  Completed, signed off  If discussed at [place] of Stay Meetings, dates discussed:    Additional Comments:  Ninfa Meeker, RN 02/26/2017, 2:23 PM

## 2017-02-26 NOTE — Progress Notes (Signed)
Inpatient Diabetes Program Recommendations  AACE/ADA: New Consensus Statement on Inpatient Glycemic Control (2015)  Target Ranges:  Prepandial:   less than 140 mg/dL      Peak postprandial:   less than 180 mg/dL (1-2 hours)      Critically ill patients:  140 - 180 mg/dL   Lab Results  Component Value Date   GLUCAP 252 (H) 02/26/2017   HGBA1C 15.4 (H) 02/22/2017    Review of Glycemic Control  Inpatient Diabetes Program Recommendations:   Patient states she is willing to take insulin again and reviewed importance of checking CBGs. Patient does not have insulin @ home and states she now has Medicaid.  Spoke with pt about  A1C results 15.4  with them and explained what an A1C is, basic pathophysiology of DM Type 2, basic home care, basic diabetes diet nutrition principles, importance of checking CBGs and maintaining good CBG control to prevent long-term and short-term complications. Reviewed signs and symptoms of hyperglycemia and hypoglycemia and how to treat hypoglycemia at home. Also reviewed blood sugar goals at home.  RNs to provide ongoing basic DM education at bedside with this patient. Have ordered educational booklet, insulin starter kit, and DM videos.    Thank you, Nani Gasser. Shelia Kingsberry, RN, MSN, CDE  Diabetes Coordinator Inpatient Glycemic Control Team Team Pager [phone] (8am-5pm) 02/26/2017 10:06 AM

## 2017-02-26 NOTE — Progress Notes (Signed)
Physical Therapy Treatment Patient Details Name: Gina [name] MRN: [id number] DOB: [birth year] Today's Date: 02/26/2017    History of Present Illness Patient had right 3rd toe amputation perfromed on12/14. She previously has had all of her metatarsels removed on the left foot. PMH: Scizophrenia; Diabetes, HTN     PT Comments    Patient tolerated ambulating ~76ft with RW, L AFO with toe filler, and min/mod A. Pt demonstrated decreased strength and balance deficits and will need 24 hour supervision/assist upon d/c. Continue to progress as tolerated.    Follow Up Recommendations  Home health PT;Supervision/Assistance - 24 hour     Equipment Recommendations  Rolling walker with 5" wheels;Wheelchair (measurements PT);Wheelchair cushion (measurements PT);3in1 (PT)    Recommendations for Other Services       Precautions / Restrictions Precautions Precautions: Fall Required Braces or Orthoses: Other Brace/Splint Other Brace/Splint: L AFO with toe filler Restrictions Weight Bearing Restrictions: Yes RLE Weight Bearing: Non weight bearing    Mobility  Bed Mobility               General bed mobility comments: pt OOB in chair upon arrival  Transfers Overall transfer level: Needs assistance Equipment used: Rolling walker (2 wheeled) Transfers: Sit to/from Stand Sit to Stand: Min assist         General transfer comment: assist to power up into standing and to gain balance upon stand; cues for safe hand placement  Ambulation/Gait Ambulation/Gait assistance: Min assist;Mod assist Ambulation Distance (Feet): 14 Feet Assistive device: Rolling walker (2 wheeled) Gait Pattern/deviations: Step-to pattern     General Gait Details: pt required assistance for balance with mod A needed at times with assistance to manage RW; cues for sequencing and safe use of AD; pt with tendency for posterior lean    Stairs            Wheelchair Mobility    Modified Rankin (Stroke  Patients Only)       Balance Overall balance assessment: Needs assistance   Sitting balance-Leahy Scale: Good   Postural control: Posterior lean Standing balance support: Bilateral upper extremity supported Standing balance-Leahy Scale: Poor                              Cognition Arousal/Alertness: Awake/alert Behavior During Therapy: WFL for tasks assessed/performed Overall Cognitive Status: Within Functional Limits for tasks assessed                                        Exercises      General Comments        Pertinent Vitals/Pain Pain Assessment: Faces Faces Pain Scale: Hurts even more Pain Location: right foot in dependent position Pain Descriptors / Indicators: Aching;Throbbing Pain Intervention(s): Limited activity within patient's tolerance;Monitored during session;Repositioned    Home Living                      Prior Function            PT Goals (current goals can now be found in the care plan section) Acute Rehab PT Goals Patient Stated Goal: to go home  PT Goal Formulation: With patient Time For Goal Achievement: 03/03/17 Potential to Achieve Goals: Good Progress towards PT goals: Progressing toward goals    Frequency    Min 3X/week      PT Plan  Current plan remains appropriate    Co-evaluation              AM-PAC PT "6 Clicks" Daily Activity  Outcome Measure  Difficulty turning over in bed (including adjusting bedclothes, sheets and blankets)?: A Little Difficulty moving from lying on back to sitting on the side of the bed? : A Little Difficulty sitting down on and standing up from a chair with arms (e.g., wheelchair, bedside commode, etc,.)?: Unable Help needed moving to and from a bed to chair (including a wheelchair)?: A Little Help needed walking in hospital room?: A Lot Help needed climbing 3-5 steps with a railing? : Total 6 Click Score: 13    End of Session Equipment Utilized During  Treatment: Gait belt Activity Tolerance: Patient tolerated treatment well Patient left: with call bell/phone within reach;in chair Nurse Communication: Mobility status PT Visit Diagnosis: Unsteadiness on feet (R26.81);Muscle weakness (generalized) (M62.81);Pain Pain - Right/Left: Right Pain - part of body: Ankle and joints of foot     Time: 1230-1300 PT Time Calculation (min) (ACUTE ONLY): 30 min  Charges:  $Gait Training: 8-22 mins $Therapeutic Activity: 8-22 mins                    G Codes:       Earney Navy, PTA Pager: [phone]     Darliss Cheney 02/26/2017, 1:11 PM

## 2017-02-26 NOTE — Anesthesia Postprocedure Evaluation (Signed)
Anesthesia Post Note  Patient: Gina [name]  Procedure(s) Performed: AMPUTATION RIGHT THIRD TOE (Right Toe)     Patient location during evaluation: PACU Anesthesia Type: General Level of consciousness: awake Pain management: pain level controlled Vital Signs Assessment: post-procedure vital signs reviewed and stable Respiratory status: spontaneous breathing Cardiovascular status: stable Postop Assessment: no apparent nausea or vomiting Anesthetic complications: no    Last Vitals:  Vitals:   02/25/17 2037 02/26/17 0655  BP: (!) 106/57 (!) 156/84  Pulse: 86 74  Resp: 18 18  Temp: 36.9 C 36.9 C  SpO2: 100% 100%    Last Pain:  Vitals:   02/26/17 0655  TempSrc: Oral  PainSc:    Pain Goal: Patients Stated Pain Goal: 3 (02/23/17 0624)               Sekai Nayak JR,JOHN [facility]

## 2017-02-26 NOTE — Progress Notes (Signed)
Patient ID: Gina [name], female   DOB: 06-02-1960, 56 y.o.   MRN: [id number] Patient is 3 days status post third ray amputation.  She has no complaints.  Anticipate patient can be discharged to home on oral antibiotics I will need to change the dressing in the office in 1 week.  She will need to be nonweightbearing on the right foot.

## 2017-02-26 NOTE — Discharge Instructions (Signed)

## 2017-02-26 NOTE — Progress Notes (Signed)
  Patient suffers from limited mobility and balance deficits due to multiple metatarsal amputations and NWB status which impairs their ability to perform daily activities like ambulating and getting in/out of the home.  A walker alone will not resolve the issues with performing activities of daily living. A wheelchair will allow patient to safely perform daily activities.  The patient can self propel in the home or has a caregiver who can provide assistance.    Earney Navy, PTA Pager: [phone]

## 2017-02-26 NOTE — Plan of Care (Signed)
  Coping: Level of anxiety will decrease 02/26/2017 1404 - Progressing by Williams Che, RN   Pain Managment: General experience of comfort will improve 02/26/2017 1404 - Progressing by Williams Che, RN   Safety: Ability to remain free from injury will improve 02/26/2017 1404 - Progressing by Williams Che, RN

## 2017-02-26 NOTE — Progress Notes (Signed)
Removed IV, provided discharge education/instructions, all questions and concerns addressed, Pt not in distress, discharged home with belongings.

## 2017-02-27 LAB — AEROBIC/ANAEROBIC CULTURE (SURGICAL/DEEP WOUND)

## 2017-02-27 LAB — AEROBIC/ANAEROBIC CULTURE W GRAM STAIN (SURGICAL/DEEP WOUND)

## 2017-02-28 ENCOUNTER — Telehealth: Payer: Self-pay | Admitting: Family Medicine

## 2017-02-28 LAB — CULTURE, BLOOD (ROUTINE X 2)
CULTURE: NO GROWTH
Culture: NO GROWTH
SPECIAL REQUESTS: ADEQUATE
SPECIAL REQUESTS: ADEQUATE

## 2017-02-28 NOTE — Telephone Encounter (Signed)
Pt. Called requesting a refill on escitalopram (LEXAPRO) 10 MG tablet Pt. Would like Rx sent to [facility] on [address]. Please f/u

## 2017-02-28 NOTE — Telephone Encounter (Signed)
Patient has not been seen since April, will forward to PCP

## 2017-03-01 ENCOUNTER — Other Ambulatory Visit: Payer: Self-pay | Admitting: Family Medicine

## 2017-03-01 DIAGNOSIS — F418 Other specified anxiety disorders: Secondary | ICD-10-CM

## 2017-03-01 MED ORDER — ESCITALOPRAM OXALATE 20 MG PO TABS: 20.0000 mg | ORAL_TABLET | Freq: Every day | ORAL | 0 refills | Status: DC

## 2017-03-01 NOTE — Telephone Encounter (Signed)
Will refill for 30 day supply only. Must be seen in office for additional refills. Recommend she schedule a follow up appointment.

## 2017-03-07 ENCOUNTER — Ambulatory Visit: Payer: Medicaid Other | Attending: Family Medicine | Admitting: Family Medicine

## 2017-03-07 ENCOUNTER — Encounter: Payer: Self-pay | Admitting: Family Medicine

## 2017-03-07 VITALS — BP 192/80 | HR 98 | Temp 97.6°F

## 2017-03-07 DIAGNOSIS — E11649 Type 2 diabetes mellitus with hypoglycemia without coma: Secondary | ICD-10-CM | POA: Diagnosis not present

## 2017-03-07 DIAGNOSIS — E1165 Type 2 diabetes mellitus with hyperglycemia: Secondary | ICD-10-CM

## 2017-03-07 DIAGNOSIS — M869 Osteomyelitis, unspecified: Secondary | ICD-10-CM | POA: Diagnosis not present

## 2017-03-07 DIAGNOSIS — Z09 Encounter for follow-up examination after completed treatment for conditions other than malignant neoplasm: Secondary | ICD-10-CM

## 2017-03-07 DIAGNOSIS — Z794 Long term (current) use of insulin: Secondary | ICD-10-CM | POA: Insufficient documentation

## 2017-03-07 DIAGNOSIS — I10 Essential (primary) hypertension: Secondary | ICD-10-CM | POA: Diagnosis not present

## 2017-03-07 DIAGNOSIS — F419 Anxiety disorder, unspecified: Secondary | ICD-10-CM | POA: Insufficient documentation

## 2017-03-07 DIAGNOSIS — E1151 Type 2 diabetes mellitus with diabetic peripheral angiopathy without gangrene: Secondary | ICD-10-CM | POA: Insufficient documentation

## 2017-03-07 DIAGNOSIS — F329 Major depressive disorder, single episode, unspecified: Secondary | ICD-10-CM | POA: Diagnosis not present

## 2017-03-07 DIAGNOSIS — M7989 Other specified soft tissue disorders: Secondary | ICD-10-CM | POA: Insufficient documentation

## 2017-03-07 DIAGNOSIS — M79671 Pain in right foot: Secondary | ICD-10-CM | POA: Diagnosis not present

## 2017-03-07 DIAGNOSIS — F418 Other specified anxiety disorders: Secondary | ICD-10-CM | POA: Diagnosis not present

## 2017-03-07 DIAGNOSIS — F209 Schizophrenia, unspecified: Secondary | ICD-10-CM | POA: Diagnosis not present

## 2017-03-07 DIAGNOSIS — E1169 Type 2 diabetes mellitus with other specified complication: Secondary | ICD-10-CM | POA: Diagnosis not present

## 2017-03-07 DIAGNOSIS — Z9114 Patient's other noncompliance with medication regimen: Secondary | ICD-10-CM | POA: Diagnosis not present

## 2017-03-07 DIAGNOSIS — K029 Dental caries, unspecified: Secondary | ICD-10-CM

## 2017-03-07 DIAGNOSIS — R509 Fever, unspecified: Secondary | ICD-10-CM | POA: Insufficient documentation

## 2017-03-07 DIAGNOSIS — Z79899 Other long term (current) drug therapy: Secondary | ICD-10-CM | POA: Diagnosis not present

## 2017-03-07 DIAGNOSIS — Z993 Dependence on wheelchair: Secondary | ICD-10-CM | POA: Diagnosis not present

## 2017-03-07 DIAGNOSIS — IMO0002 Reserved for concepts with insufficient information to code with codable children: Secondary | ICD-10-CM

## 2017-03-07 LAB — GLUCOSE, POCT (MANUAL RESULT ENTRY)
POC Glucose: 49 mg/dl — AB (ref 70–99)
POC Glucose: 71 mg/dl (ref 70–99)

## 2017-03-07 MED ORDER — SITAGLIPTIN PHOSPHATE 50 MG PO TABS: 50.0000 mg | ORAL_TABLET | Freq: Every day | ORAL | 2 refills | Status: DC

## 2017-03-07 MED ORDER — ACCU-CHEK SOFT TOUCH LANCETS MISC: 12 refills | Status: DC

## 2017-03-07 MED ORDER — GLUCOSE BLOOD VI STRP: ORAL_STRIP | 12 refills | Status: DC

## 2017-03-07 MED ORDER — ACCU-CHEK AVIVA PLUS W/DEVICE KIT: 1.0000 | PACK | Freq: Once | 0 refills | Status: AC

## 2017-03-07 NOTE — Patient Instructions (Signed)

## 2017-03-07 NOTE — Progress Notes (Signed)
Subjective:  Patient ID: Gina [name], female    DOB: [birth year]  Age: 56 y.o. MRN: [id number]  CC: Hospitalization Follow-up   HPI Gina [name] presents for hospital follow up. Last seen in office in April.  History of hospitalization 02/22/17-02/26/17 for osteomyelitis.  PMH includes uncontrolled DM type 2, HTN, schizophrenia, medication non-adherence, and left transmetarsal amputation. She present to the ED with right foot swelling, pain, fever, and chills and was admitted for sepsis r/t osteomyelitis. She received labs, IV ABT, and orthopedics was consulted. Amputation to distal phalanx of right third toe. . Patient lives by herself and has two sons and one daughter who attempt to assist.She was d/c on oral ABT therapy Her sister reports patient missed two doses on Sunday night. She expresses concern over patient's medication management.  It was recommend she follow up with orthopedic outpatient and PCP in 1 week. She and her sister report patient has not followed up with orthopedic outpatient yet. Patient was advised to be NWB on right foot however pt.sister reports patient non-adherence. Diabetes: History of poorly controlled DM with non-adherence.  Reports taking CBG TID. CBG's ranging in 91's-220's. She was found to be hypoglycemic in office today. Requests dental referral history of dental decay. Denies any dental pain, drainage, of facial swelling. History of depression and schizoprehnia: She is taking Lexapro daily.  Denis any SI/HI. She denies any side effects from Lexapro use. She is currently not receiving therapy. She is agreeable to referral.     Outpatient Medications Prior to Visit  Medication Sig Dispense Refill  . amoxicillin-clavulanate (AUGMENTIN) 875-125 MG tablet Take 1 tablet by mouth 2 (two) times daily. 60 tablet 0  . acetaminophen (TYLENOL) 325 MG tablet Take 2 tablets (650 mg total) by mouth every 6 (six) hours as needed for mild pain (or Fever >/= 101). (Patient not  taking: Reported on 02/20/2017)    . bisacodyl (DULCOLAX) 10 MG suppository Place 1 suppository (10 mg total) rectally daily as needed for moderate constipation. (Patient not taking: Reported on 02/20/2017) 12 suppository 0  . blood glucose meter kit and supplies KIT Dispense based on patient and insurance preference. Use up to four times daily as directed. (FOR ICD-9 250.00, 250.01). (Patient not taking: Reported on 03/07/2017) 30 each 0  . docusate sodium (COLACE) 100 MG capsule Take 1 capsule (100 mg total) by mouth 2 (two) times daily. (Patient not taking: Reported on 02/20/2017) 10 capsule 0  . escitalopram (LEXAPRO) 20 MG tablet Take 1 tablet (20 mg total) by mouth daily. (Patient not taking: Reported on 03/07/2017) 30 tablet 0  . ferrous sulfate 325 (65 FE) MG tablet Take 1 tablet (325 mg total) by mouth 2 (two) times daily with a meal. (Patient not taking: Reported on 02/20/2017) 60 tablet 2  . insulin aspart (NOVOLOG) 100 UNIT/ML injection Inject 0-9 Units into the skin 3 (three) times daily with meals. (Patient not taking: Reported on 03/07/2017) 10 mL 0  . insulin glargine (LANTUS) 100 UNIT/ML injection Inject 0.2 mLs (20 Units total) into the skin daily at 10 pm. (Patient not taking: Reported on 03/07/2017) 10 mL 0  . lisinopril (PRINIVIL,ZESTRIL) 2.5 MG tablet Take 1 tablet (2.5 mg total) by mouth daily. (Patient not taking: Reported on 02/20/2017) 30 tablet 2  . polyethylene glycol (MIRALAX) packet Take 17 g by mouth daily as needed for mild constipation. (Patient not taking: Reported on 02/20/2017) 14 each 0  . senna-docusate (SENOKOT-S) 8.6-50 MG tablet Take 2 tablets by mouth daily. (  Patient not taking: Reported on 02/20/2017) 60 tablet 0   No facility-administered medications prior to visit.      Review of Systems  Constitutional: Negative.   HENT:       Dental problem  Eyes: Negative.   Respiratory: Negative.   Cardiovascular: Negative.   Gastrointestinal: Negative.   Skin:         Surgical wound- right foot  Psychiatric/Behavioral: Negative for suicidal ideas.       History of depression/schizophrenia     Objective:  BP (!) 192/80   Pulse 98   Temp 97.6 F (36.4 C) (Oral)   SpO2 99%   BP/Weight 03/07/2017 02/26/2017 54/11/8117  Systolic BP 147 829 -  Diastolic BP 80 84 -  Wt. (Lbs) - - 129  BMI - - 23.59  Some encounter information is confidential and restricted. Go to Review Flowsheets activity to see all data.   Physical Exam  Nursing note and vitals reviewed. Constitutional: She appears well-developed and well-nourished.  HENT:  Head: Normocephalic and atraumatic.  Right Ear: External ear normal.  Left Ear: External ear normal.  Nose: Nose normal.  Mouth/Throat: Oropharynx is clear and moist. Abnormal dentition (decay).  Eyes: Conjunctivae are normal. Pupils are equal, round, and reactive to light.  Cardiovascular: Normal rate, regular rhythm, normal heart sounds and intact distal pulses.  Respiratory: Effort normal and breath sounds normal.  GI: Soft. Bowel sounds are normal.  Skin: Skin is warm and dry. Lesion (healed blister to left lower anterior leg.) noted.  Surgical pressure dressing in place. Dressing is clean, dry, and intact.   Psychiatric: Her affect is blunt. She expresses no homicidal and no suicidal ideation. She expresses no suicidal plans and no homicidal plans. She is communicative. She is inattentive.     Assessment & Plan:   1. Hospital discharge follow-up -Urgent referral placed. -Recommended sister and/or patient contact orthopedic office to follow up as well. - Ambulatory referral to Orthopedics  2. DM (diabetes mellitus), type 2, uncontrolled, periph vascular complic (HCC) -Check CBG's ACHS.  -Bring glucometer to next office visit.  - POCT glucose (manual entry) - Ambulatory referral to [facility] - Blood Glucose Monitoring Suppl (ACCU-CHEK AVIVA PLUS) w/Device KIT; 1 kit by Does not apply route once for 1  dose.  Dispense: 1 kit; Refill: 0 - glucose blood (ACCU-CHEK AVIVA PLUS) test strip; Use as instructed  Dispense: 100 each; Refill: 12 - Lancets (ACCU-CHEK SOFT TOUCH) lancets; Use as instructed  Dispense: 100 each; Refill: 12 - POCT glucose (manual entry) - sitaGLIPtin (JANUVIA) 50 MG tablet; Take 1 tablet (50 mg total) by mouth daily.  Dispense: 30 tablet; Refill: 2  3. Osteomyelitis due to type 2 diabetes mellitus ([place])  - Ambulatory referral to Home Health  4. Schizophrenia, unspecified type [facility])  - Ambulatory referral to Psychiatry  5. Depression with anxiety Continue Lexapro use. - Ambulatory referral to Psychiatry  6. Dental decay  - Ambulatory referral to Dentistry  7. Poor compliance with medication  - Ambulatory referral to Home Health  8. Wheelchair bound  - Ambulatory referral to [facility]      Follow-up: Return in about 2 weeks (around 03/21/2017) for DM.   Alfonse Spruce FNP

## 2017-03-08 ENCOUNTER — Encounter: Payer: Self-pay | Admitting: Pharmacist

## 2017-03-08 ENCOUNTER — Encounter (INDEPENDENT_AMBULATORY_CARE_PROVIDER_SITE_OTHER): Payer: Self-pay | Admitting: Surgery

## 2017-03-08 ENCOUNTER — Ambulatory Visit (INDEPENDENT_AMBULATORY_CARE_PROVIDER_SITE_OTHER): Payer: Medicaid Other | Admitting: Surgery

## 2017-03-08 ENCOUNTER — Other Ambulatory Visit: Payer: Self-pay | Admitting: Family Medicine

## 2017-03-08 DIAGNOSIS — F418 Other specified anxiety disorders: Secondary | ICD-10-CM

## 2017-03-08 DIAGNOSIS — IMO0002 Reserved for concepts with insufficient information to code with codable children: Secondary | ICD-10-CM

## 2017-03-08 DIAGNOSIS — S98131A Complete traumatic amputation of one right lesser toe, initial encounter: Secondary | ICD-10-CM

## 2017-03-08 DIAGNOSIS — E1151 Type 2 diabetes mellitus with diabetic peripheral angiopathy without gangrene: Secondary | ICD-10-CM

## 2017-03-08 DIAGNOSIS — Z89421 Acquired absence of other right toe(s): Secondary | ICD-10-CM

## 2017-03-08 DIAGNOSIS — D509 Iron deficiency anemia, unspecified: Secondary | ICD-10-CM

## 2017-03-08 DIAGNOSIS — D649 Anemia, unspecified: Secondary | ICD-10-CM

## 2017-03-08 DIAGNOSIS — D500 Iron deficiency anemia secondary to blood loss (chronic): Secondary | ICD-10-CM

## 2017-03-08 DIAGNOSIS — E1165 Type 2 diabetes mellitus with hyperglycemia: Principal | ICD-10-CM

## 2017-03-08 MED ORDER — INSULIN GLARGINE 100 UNIT/ML ~~LOC~~ SOLN: 20.0000 [IU] | Freq: Every day | SUBCUTANEOUS | 1 refills | Status: DC

## 2017-03-08 MED ORDER — FERROUS SULFATE 325 (65 FE) MG PO TABS: 325.0000 mg | ORAL_TABLET | Freq: Two times a day (BID) | ORAL | 2 refills | Status: DC

## 2017-03-08 MED ORDER — INSULIN ASPART 100 UNIT/ML ~~LOC~~ SOLN: 0.0000 [IU] | Freq: Three times a day (TID) | SUBCUTANEOUS | 2 refills | Status: DC

## 2017-03-08 MED ORDER — ESCITALOPRAM OXALATE 20 MG PO TABS: 20.0000 mg | ORAL_TABLET | Freq: Every day | ORAL | 2 refills | Status: DC

## 2017-03-08 NOTE — Progress Notes (Signed)
   Post-Op Visit Note   Patient: Gina [name]           Date of Birth: [birth year]           MRN: [id number] Visit Date: 03/08/2017 PCP: Alfonse Spruce, FNP   Assessment & Plan:  Chief Complaint:  Chief Complaint  Patient presents with  . Right 3rd Toe - Routine Post Op   Visit Diagnoses:  1. Amputated toe of right foot ([place])     Plan: Advised patient that she must be compliant with nonweightbearing restrictions right foot. Also must have foot elevated well above heart level as much as possible to decrease swelling and help promote healing. Stressed to her the importance of being compliant with these instructions. Continue antibiotics. Follow-up with Dr. Sharol Given on Monday for recheck.  Follow-Up Instructions: Return in about 4 days (around 03/12/2017) for [facility] recheck right foot per Jeneen Rinks. .   Orders:  No orders of the defined types were placed in this encounter.  No orders of the defined types were placed in this encounter.   Imaging: No results found.  PMFS History: Patient Active Problem List   Diagnosis Date Noted  . Osteomyelitis of toe ([place])   . Osteomyelitis of right foot ([place]) 02/22/2017  . Sepsis ([place]) 02/22/2017  . Schizophrenia ([place])   . S/P transmetatarsal amputation of foot, left ([place]) 06/15/2016  . DM (diabetes mellitus), type 2, uncontrolled, periph vascular complic ([place]) 12/87/8676  . Anemia of chronic disease 06/07/2016  . Cellulitis of left foot   . Protein-calorie malnutrition, severe 06/03/2016  . Osteomyelitis due to type 2 diabetes mellitus ([place]) 06/02/2016  . Septic arthritis of interphalangeal joint of toe of left foot ([place]) 06/02/2016  . Colon cancer screening 02/17/2014  . Breast cancer screening 02/17/2014  . Diabetes ([place]) 07/28/2013  . Cholelithiases 07/28/2013  . DKA (diabetic ketoacidoses) ([place]) 06/18/2013  . HTN (hypertension) 06/18/2013  . Cellulitis of female genitalia 08/03/2012  . Hyperglycemia 07/31/2012  . Candidal skin  infection 07/31/2012  . Cellulitis and abscess 07/31/2012   Past Medical History:  Diagnosis Date  . Diabetes mellitus   . Hypertension   . Schizophrenia ([place])     Family History  Problem Relation Age of Onset  . Diabetes type II Father   . CAD Father   . Prostate cancer Father   . Diabetes Mellitus II Brother     Past Surgical History:  Procedure Laterality Date  . AMPUTATION Left 06/05/2016   Procedure: LEFT FOOT TRANSMETATARSAL AMPUTATION;  Surgeon: [name], MD;  [facility];  Service: Orthopedics;  Laterality: Left;  . AMPUTATION TOE Right 02/23/2017   Procedure: AMPUTATION RIGHT THIRD TOE;  Surgeon: [name], MD;  Location: [place];  Service: Orthopedics;  Laterality: Right;  . TUBAL LIGATION     Social History   Occupational History  . Not on file  Tobacco Use  . Smoking status: Never Smoker  . Smokeless tobacco: Never Used  Substance and Sexual Activity  . Alcohol use: No    Comment: occasionally  . Drug use: No  . Sexual activity: Not on file   Exam Right foot shows moderate swelling. Serous drainage. Sutures are intact.

## 2017-03-08 NOTE — Progress Notes (Signed)
PA completed for Januvia through [facility] Medicaid. Pending approval. Confirmation #[id number] W

## 2017-03-14 ENCOUNTER — Telehealth: Payer: Self-pay | Admitting: Family Medicine
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
## 2017-03-15 ENCOUNTER — Telehealth: Payer: Self-pay | Admitting: Family Medicine

## 2017-03-15 NOTE — Telephone Encounter (Signed)
Patient's referral, demographics and office notes were faxed yesterday 03/14/16 to [facility].  Received an email from [employer] stating:   [place] again [place].  Unfortunately, I have the same news regarding this patient as your previous patient.  We received your referral however at this time, we'll be unable to accept him. We are still trying to get caught up in the [employer] area from the holiday hospital and facility discharges so we do not currently have staffing available for new referrals. We hope to be caught up in the next week or two.   Thank you for thinking of [facility] and we hope to help you again very soon!   [place]

## 2017-03-16 ENCOUNTER — Encounter (INDEPENDENT_AMBULATORY_CARE_PROVIDER_SITE_OTHER): Payer: Self-pay | Admitting: Family

## 2017-03-16 ENCOUNTER — Other Ambulatory Visit: Payer: Self-pay | Admitting: Family Medicine

## 2017-03-16 ENCOUNTER — Telehealth: Payer: Self-pay | Admitting: Family Medicine

## 2017-03-16 ENCOUNTER — Ambulatory Visit (INDEPENDENT_AMBULATORY_CARE_PROVIDER_SITE_OTHER): Payer: Medicaid Other | Admitting: Family

## 2017-03-16 DIAGNOSIS — Z89421 Acquired absence of other right toe(s): Secondary | ICD-10-CM

## 2017-03-16 DIAGNOSIS — F409 Phobic anxiety disorder, unspecified: Secondary | ICD-10-CM

## 2017-03-16 DIAGNOSIS — S98131A Complete traumatic amputation of one right lesser toe, initial encounter: Secondary | ICD-10-CM

## 2017-03-16 DIAGNOSIS — IMO0002 Reserved for concepts with insufficient information to code with codable children: Secondary | ICD-10-CM

## 2017-03-16 DIAGNOSIS — Z89432 Acquired absence of left foot: Secondary | ICD-10-CM

## 2017-03-16 NOTE — Telephone Encounter (Signed)
Music therapist given [place]. Order placed to [employer]. Please follow up.

## 2017-03-16 NOTE — Telephone Encounter (Signed)
Faxed patient's home health referral to [facility] [phone].

## 2017-03-16 NOTE — Progress Notes (Signed)
   Post-Op Visit Note   Patient: Gina [name]           Date of Birth: [birth year]           MRN: [id number] Visit Date: 03/16/2017 PCP: Alfonse Spruce, FNP  Chief Complaint: No chief complaint on file.   HPI:  HPI Patient is a 57 year old woman seen today status post 3rd toe amputation on right. [facility] to begin assisting with wound care.  Ortho Exam Incision approximated with sutures. There is maceration and moderate swelling. No drainage, or sign of infection.  Visit Diagnoses:  1. Toe amputation status, right (HCC)     Plan: begin daily dial soap cleansing. Apply dry dressings daily. Follow up in office for suture removal.   Follow-Up Instructions: Return in about 10 days (around 03/26/2017).   Imaging: No results found.  Orders:  No orders of the defined types were placed in this encounter.  No orders of the defined types were placed in this encounter.    PMFS History: Patient Active Problem List   Diagnosis Date Noted  . Toe amputation status, right ([place]) 03/16/2017  . Sepsis ([place]) 02/22/2017  . Schizophrenia ([place])   . S/P transmetatarsal amputation of foot, left ([place]) 06/15/2016  . DM (diabetes mellitus), type 2, uncontrolled, periph vascular complic ([place]) 03/55/9741  . Anemia of chronic disease 06/07/2016  . Protein-calorie malnutrition, severe 06/03/2016  . Osteomyelitis due to type 2 diabetes mellitus ([place]) 06/02/2016  . Colon cancer screening 02/17/2014  . Breast cancer screening 02/17/2014  . Diabetes ([place]) 07/28/2013  . Cholelithiases 07/28/2013  . DKA (diabetic ketoacidoses) ([place]) 06/18/2013  . HTN (hypertension) 06/18/2013  . Cellulitis of female genitalia 08/03/2012  . Hyperglycemia 07/31/2012  . Candidal skin infection 07/31/2012   Past Medical History:  Diagnosis Date  . Diabetes mellitus   . Hypertension   . Osteomyelitis of right foot ([place]) 02/22/2017  . Schizophrenia ([place])   . Septic arthritis of interphalangeal joint of toe  of left foot ([place]) 06/02/2016    Family History  Problem Relation Age of Onset  . Diabetes type II Father   . CAD Father   . Prostate cancer Father   . Diabetes Mellitus II Brother     Past Surgical History:  Procedure Laterality Date  . AMPUTATION Left 06/05/2016   Procedure: LEFT FOOT TRANSMETATARSAL AMPUTATION;  Surgeon: [name], MD;  [facility];  Service: Orthopedics;  Laterality: Left;  . AMPUTATION TOE Right 02/23/2017   Procedure: AMPUTATION RIGHT THIRD TOE;  Surgeon: [name], MD;  Location: [place];  Service: Orthopedics;  Laterality: Right;  . TUBAL LIGATION     Social History   Occupational History  . Not on file  Tobacco Use  . Smoking status: Never Smoker  . Smokeless tobacco: Never Used  Substance and Sexual Activity  . Alcohol use: No    Comment: occasionally  . Drug use: No  . Sexual activity: Not on file

## 2017-03-16 NOTE — Telephone Encounter (Signed)
Call placed to [facility] # [phone] regarding patient's referral. Spoke with [facility] and she confirmed that the referral was received but that unfortunately they won't be able to provide service. They don't have staffing for that area at this moment.

## 2017-03-19 ENCOUNTER — Telehealth: Payer: Self-pay

## 2017-03-19 ENCOUNTER — Encounter (INDEPENDENT_AMBULATORY_CARE_PROVIDER_SITE_OTHER): Payer: Self-pay | Admitting: Family

## 2017-03-19 NOTE — Telephone Encounter (Signed)
Referral for home health services had been faxed to [facility] 03/16/17. Call placed to [facility] today # [phone] , spoke to Erda who confirmed receipt of the referral. She stated that it appears nursing has already seen the patient and they are waiting for approval from medicaid to initiate PT services.  Prescription for manual wheelchair faxed to [facility]

## 2017-03-20 ENCOUNTER — Telehealth (INDEPENDENT_AMBULATORY_CARE_PROVIDER_SITE_OTHER): Payer: Self-pay

## 2017-03-20 ENCOUNTER — Telehealth: Payer: Self-pay | Admitting: Family Medicine

## 2017-03-20 NOTE — Telephone Encounter (Signed)
[name], P.T. Called stated patient wound not getting much better. Wanted to know if Dr Sharol Given wanted to change patients treatment? She currently is getting wet to dry dressings. She does have increased edema and what she described as a pin hole tunnel in the wound?? Please call her to advise. [phone]

## 2017-03-20 NOTE — Telephone Encounter (Signed)
Gina [name] with [facility] calling concerning patient.

## 2017-03-20 NOTE — Telephone Encounter (Signed)
Call placed to [facility] regarding patient's referral for wheelchair. Spoke with Darnelle Bos and she informed me that referral was received. Based on patient's insurance, provider will need to fill out a WOPD. Document was faxed to the office. Once provider fills out the document (order/narrrative), it needs to be sent back to them [facility]).

## 2017-03-21 ENCOUNTER — Telehealth: Payer: Self-pay

## 2017-03-21 NOTE — Telephone Encounter (Signed)
PCS referral faxed to [facility]

## 2017-03-21 NOTE — Telephone Encounter (Signed)
Pt has an appt on Friday. Will hold this message and call to advise of updated orders. Did call and advise HHn of this plan.

## 2017-03-22 ENCOUNTER — Telehealth: Payer: Self-pay | Admitting: Family Medicine

## 2017-03-22 ENCOUNTER — Ambulatory Visit: Payer: Medicaid Other | Admitting: Family Medicine

## 2017-03-22 NOTE — Telephone Encounter (Signed)
Call placed to [facility] [phone], regarding patient's referral. Spoke with Jorbick and he informed me that referral was received. Jorbick stated that patient can contact [facility] and schedule her assessment. If the wants the process to go quicker, but Janeece Riggers will contact patient as well.

## 2017-03-23 ENCOUNTER — Telehealth: Payer: Self-pay | Admitting: Family Medicine

## 2017-03-23 ENCOUNTER — Other Ambulatory Visit: Payer: Self-pay | Admitting: Pharmacist

## 2017-03-23 ENCOUNTER — Other Ambulatory Visit: Payer: Self-pay | Admitting: Family Medicine

## 2017-03-23 ENCOUNTER — Encounter (INDEPENDENT_AMBULATORY_CARE_PROVIDER_SITE_OTHER): Payer: Self-pay | Admitting: Family

## 2017-03-23 ENCOUNTER — Ambulatory Visit (INDEPENDENT_AMBULATORY_CARE_PROVIDER_SITE_OTHER): Payer: Medicaid Other | Admitting: Family

## 2017-03-23 DIAGNOSIS — E1165 Type 2 diabetes mellitus with hyperglycemia: Secondary | ICD-10-CM

## 2017-03-23 DIAGNOSIS — R809 Proteinuria, unspecified: Secondary | ICD-10-CM

## 2017-03-23 DIAGNOSIS — Z89421 Acquired absence of other right toe(s): Secondary | ICD-10-CM

## 2017-03-23 MED ORDER — LISINOPRIL 2.5 MG PO TABS: 2.5000 mg | ORAL_TABLET | Freq: Every day | ORAL | 2 refills | Status: DC

## 2017-03-23 NOTE — Telephone Encounter (Signed)
Pt called back to ask to please sent it to the correct pharmacy that is [facility] on [address], please follow up

## 2017-03-23 NOTE — Progress Notes (Signed)
   Post-Op Visit Note   Patient: Gina [name]           Date of Birth: Dec 03, 1960           MRN: [id number] Visit Date: 03/23/2017 PCP: Alfonse Spruce, FNP  Chief Complaint: No chief complaint on file.   HPI:  HPI Patient is a 58 year old woman seen today status post 3rd toe amputation on right on 02/23/17. [facility] assisting with wound care.  Sons accompany visit. Are concerned about her incision opening up. State she has been doing too much, cooking, standing, doing laundry. Also not taking abx as prescribed. States misses days, then doubles up on next day.   Ortho Exam Incision approximated with sutures. Is well healed dorsally and plantarly. In webspace has opened up , is 4 mm deep. There is maceration and moderate swelling. Dry peeling skin to toes and foot. serous drainage no erythema or warmth or sign of infection.  Visit Diagnoses:  No diagnosis found.  Plan: Suture removal today. Stressed importance of daily incisional cleansing, dressings and nonweight bearing for wound healing. Elevate as able. Follow up in office in 2 weeks with dr duda. Discussed proper adherence of oral abx.   Follow-Up Instructions: No Follow-up on file.   Imaging: No results found.  Orders:  No orders of the defined types were placed in this encounter.  No orders of the defined types were placed in this encounter.    PMFS History: Patient Active Problem List   Diagnosis Date Noted  . Toe amputation status, right ([place]) 03/16/2017  . Sepsis ([place]) 02/22/2017  . Schizophrenia ([place])   . S/P transmetatarsal amputation of foot, left ([place]) 06/15/2016  . DM (diabetes mellitus), type 2, uncontrolled, periph vascular complic ([place]) 01/74/9449  . Anemia of chronic disease 06/07/2016  . Protein-calorie malnutrition, severe 06/03/2016  . Osteomyelitis due to type 2 diabetes mellitus ([place]) 06/02/2016  . Colon cancer screening 02/17/2014  . Breast cancer screening 02/17/2014  . Diabetes ([place])  07/28/2013  . Cholelithiases 07/28/2013  . DKA (diabetic ketoacidoses) ([place]) 06/18/2013  . HTN (hypertension) 06/18/2013  . Cellulitis of female genitalia 08/03/2012  . Hyperglycemia 07/31/2012  . Candidal skin infection 07/31/2012   Past Medical History:  Diagnosis Date  . Diabetes mellitus   . Hypertension   . Osteomyelitis of right foot ([place]) 02/22/2017  . Schizophrenia ([place])   . Septic arthritis of interphalangeal joint of toe of left foot ([place]) 06/02/2016    Family History  Problem Relation Age of Onset  . Diabetes type II Father   . CAD Father   . Prostate cancer Father   . Diabetes Mellitus II Brother     Past Surgical History:  Procedure Laterality Date  . AMPUTATION Left 06/05/2016   Procedure: LEFT FOOT TRANSMETATARSAL AMPUTATION;  Surgeon: [name], MD;  [facility];  Service: Orthopedics;  Laterality: Left;  . AMPUTATION TOE Right 02/23/2017   Procedure: AMPUTATION RIGHT THIRD TOE;  Surgeon: [name], MD;  Location: [place];  Service: Orthopedics;  Laterality: Right;  . TUBAL LIGATION     Social History   Occupational History  . Not on file  Tobacco Use  . Smoking status: Never Smoker  . Smokeless tobacco: Never Used  Substance and Sexual Activity  . Alcohol use: No    Comment: occasionally  . Drug use: No  . Sexual activity: Not on file

## 2017-03-26 NOTE — Telephone Encounter (Signed)
Lisinopril sent to correct pharmacy 03/23/17

## 2017-03-27 ENCOUNTER — Other Ambulatory Visit: Payer: Self-pay | Admitting: Family Medicine

## 2017-03-27 ENCOUNTER — Telehealth: Payer: Self-pay | Admitting: Family Medicine

## 2017-03-27 DIAGNOSIS — F418 Other specified anxiety disorders: Secondary | ICD-10-CM

## 2017-03-27 MED ORDER — ACCU-CHEK AVIVA PLUS W/DEVICE KIT: PACK | 0 refills | Status: DC

## 2017-03-27 MED ORDER — ESCITALOPRAM OXALATE 20 MG PO TABS: 20.0000 mg | ORAL_TABLET | Freq: Every day | ORAL | 2 refills | Status: DC

## 2017-03-27 NOTE — Telephone Encounter (Signed)
Spoke with connie and advised of recommendation from Fridays visit. Also faxed office note per her request to [phone] [facility].

## 2017-03-27 NOTE — Telephone Encounter (Signed)
Pt called to request a refill on her meter and all medications until her next appointment if approved please send to [facility] on [address] Please follow up

## 2017-03-27 NOTE — Telephone Encounter (Signed)
PCP sent all medication at last visit to [facility]. Sent in script for new meter.

## 2017-03-27 NOTE — Addendum Note (Signed)
Addended by: Rica Mast on: 03/27/2017 02:31 PM   Modules accepted: Orders

## 2017-03-28 ENCOUNTER — Telehealth: Payer: Self-pay | Admitting: Family Medicine

## 2017-03-28 NOTE — Telephone Encounter (Signed)
Call placed to [facility] [phone], social worker, at [facility] to inquire about acquiring an emotional dog as per patient's request. No answer. Left Reuben Likes a message to return my call at [phone].   Call placed to [facility] from [place] [phone], as well regarding services. Cora informed me that they have patients who have emotional support pets and they are able to help them by feeding and walking the pet, taking them to the vet, etc., but the patients got the pet on their own. Cora doesn't know of any organization that would help patient get a pet for free.   Call placed to patient to inform her of my calls with organizations. Explained to patient that the best route and because of her financial needs is to contact the local shelter to get a pet. Informed her that provider can write a letter to landlord stating that patient need the emotional pet due to her mental health. With that letter, the landlord might waive the pet fees. Patient understood. Patient stated that she has to wait to get her disability check because she can't afford it now. I asked her to inform us when she does.

## 2017-03-29 ENCOUNTER — Ambulatory Visit: Payer: Medicaid Other | Admitting: Family Medicine

## 2017-03-29 ENCOUNTER — Telehealth: Payer: Self-pay | Admitting: Family Medicine

## 2017-03-29 NOTE — Telephone Encounter (Signed)
Patient wound nurse called an requested for the patient to have a copy of her sliding scale for Novalog insulin aspart (NOVOLOG) 100 UNIT/ML injection [[id number]]

## 2017-04-02 NOTE — Telephone Encounter (Signed)
Pt has an appointment on tomorrow, will give to patient at the appointment.

## 2017-04-03 ENCOUNTER — Ambulatory Visit: Payer: Medicaid Other | Attending: Family Medicine | Admitting: Family Medicine

## 2017-04-03 ENCOUNTER — Encounter: Payer: Self-pay | Admitting: Family Medicine

## 2017-04-03 VITALS — BP 145/78 | HR 91 | Temp 98.2°F | Resp 18 | Ht 62.0 in | Wt 155.0 lb

## 2017-04-03 DIAGNOSIS — E1151 Type 2 diabetes mellitus with diabetic peripheral angiopathy without gangrene: Secondary | ICD-10-CM | POA: Diagnosis not present

## 2017-04-03 DIAGNOSIS — F209 Schizophrenia, unspecified: Secondary | ICD-10-CM | POA: Diagnosis not present

## 2017-04-03 DIAGNOSIS — E1165 Type 2 diabetes mellitus with hyperglycemia: Secondary | ICD-10-CM | POA: Insufficient documentation

## 2017-04-03 DIAGNOSIS — I739 Peripheral vascular disease, unspecified: Secondary | ICD-10-CM | POA: Insufficient documentation

## 2017-04-03 DIAGNOSIS — Z794 Long term (current) use of insulin: Secondary | ICD-10-CM | POA: Diagnosis not present

## 2017-04-03 DIAGNOSIS — F419 Anxiety disorder, unspecified: Secondary | ICD-10-CM | POA: Insufficient documentation

## 2017-04-03 DIAGNOSIS — E1369 Other specified diabetes mellitus with other specified complication: Secondary | ICD-10-CM | POA: Diagnosis not present

## 2017-04-03 DIAGNOSIS — F329 Major depressive disorder, single episode, unspecified: Secondary | ICD-10-CM | POA: Insufficient documentation

## 2017-04-03 DIAGNOSIS — E119 Type 2 diabetes mellitus without complications: Secondary | ICD-10-CM | POA: Diagnosis present

## 2017-04-03 DIAGNOSIS — Z79899 Other long term (current) drug therapy: Secondary | ICD-10-CM | POA: Insufficient documentation

## 2017-04-03 DIAGNOSIS — IMO0002 Reserved for concepts with insufficient information to code with codable children: Secondary | ICD-10-CM

## 2017-04-03 MED ORDER — INSULIN PEN NEEDLE 32G X 4 MM MISC: 1.0000 | Freq: Once | 11 refills | Status: AC

## 2017-04-03 MED ORDER — INSULIN ISOPHANE & REGULAR (HUMAN 70-30)100 UNIT/ML KWIKPEN: 10.0000 [IU] | PEN_INJECTOR | Freq: Two times a day (BID) | SUBCUTANEOUS | 4 refills | Status: DC

## 2017-04-03 MED ORDER — SITAGLIPTIN PHOSPHATE 100 MG PO TABS: 100.0000 mg | ORAL_TABLET | Freq: Every day | ORAL | 2 refills | Status: DC

## 2017-04-03 NOTE — Progress Notes (Signed)
poc

## 2017-04-03 NOTE — Patient Instructions (Signed)
Start checking CBG's three times a day. Check CBG's prior to taking insulin.

## 2017-04-03 NOTE — Progress Notes (Signed)
Subjective:  Patient ID: Gina [name], female    DOB: 03-05-1961  Age: 57 y.o. MRN: [id number]  CC: Follow-up   HPI Mckinna Demars presents for diabetes follow up. She is accompanied by her daughter. Per daughter she reports patient is receiving about an 1 hr per day in home assistance and has up to 54 hours allocated per month. Daughter is requesting more time be allocated for assistance.  She reports patient is not checking sugars regularly and is not following SSI scale. She reports patient has lost SSI scale information. She is requesting copies of  SSI scales to post around home to remind patient to take her insulin. She reports patient is non-adherent to carbohydrate modified diet. History of toe amputation. She reports upcoming follow up with orthopedics on Thursday.History of mental disorders: She denies current suicidal and homicidal plan or intent. Possible organic causes contributing are: past history of drug abuse.  Risk factors: previous episode of depression, anxiety, and schizophrenia.  Previous treatment includes Lexapro.  She complains of the following side effects from the treatment: none. Her daughter reports patient has [employer] but has not follow up.      Outpatient Medications Prior to Visit  Medication Sig Dispense Refill  . acetaminophen (TYLENOL) 325 MG tablet Take 2 tablets (650 mg total) by mouth every 6 (six) hours as needed for mild pain (or Fever >/= 101).    . Blood Glucose Monitoring Suppl (ACCU-CHEK AVIVA PLUS) w/Device KIT Use as directed TID. E11.9 1 kit 0  . escitalopram (LEXAPRO) 20 MG tablet Take 1 tablet (20 mg total) by mouth daily. 30 tablet 2  . ferrous sulfate 325 (65 FE) MG tablet Take 1 tablet (325 mg total) by mouth 2 (two) times daily with a meal. 60 tablet 2  . lisinopril (PRINIVIL,ZESTRIL) 2.5 MG tablet Take 1 tablet (2.5 mg total) by mouth daily. 30 tablet 2  . insulin aspart (NOVOLOG) 100 UNIT/ML injection Inject 0-9 Units into the  skin 3 (three) times daily with meals. 30 mL 2  . insulin glargine (LANTUS) 100 UNIT/ML injection Inject 0.2 mLs (20 Units total) into the skin daily at 10 pm. 30 mL 1  . sitaGLIPtin (JANUVIA) 50 MG tablet Take 1 tablet (50 mg total) by mouth daily. 30 tablet 2  . amoxicillin-clavulanate (AUGMENTIN) 875-125 MG tablet Take 1 tablet by mouth 2 (two) times daily. 60 tablet 0  . bisacodyl (DULCOLAX) 10 MG suppository Place 1 suppository (10 mg total) rectally daily as needed for moderate constipation. (Patient not taking: Reported on 02/20/2017) 12 suppository 0  . blood glucose meter kit and supplies KIT Dispense based on patient and insurance preference. Use up to four times daily as directed. (FOR ICD-9 250.00, 250.01). (Patient not taking: Reported on 03/07/2017) 30 each 0  . docusate sodium (COLACE) 100 MG capsule Take 1 capsule (100 mg total) by mouth 2 (two) times daily. (Patient not taking: Reported on 02/20/2017) 10 capsule 0  . glucose blood (ACCU-CHEK AVIVA PLUS) test strip Use as instructed (Patient not taking: Reported on 03/08/2017) 100 each 12  . Lancets (ACCU-CHEK SOFT TOUCH) lancets Use as instructed (Patient not taking: Reported on 03/08/2017) 100 each 12  . polyethylene glycol (MIRALAX) packet Take 17 g by mouth daily as needed for mild constipation. (Patient not taking: Reported on 02/20/2017) 14 each 0  . senna-docusate (SENOKOT-S) 8.6-50 MG tablet Take 2 tablets by mouth daily. (Patient not taking: Reported on 02/20/2017) 60 tablet 0   No facility-administered medications  prior to visit.     ROS Review of Systems  Constitutional: Negative.   Respiratory: Negative.   Cardiovascular: Negative.   Psychiatric/Behavioral: Positive for dysphoric mood (History of depression/schizophrenia). Negative for suicidal ideas.    Objective:  BP (!) 145/78 (BP Location: Left Arm, Patient Position: Sitting, Cuff Size: Normal)   Pulse 91   Temp 98.2 F (36.8 C) (Oral)   Resp 18   Ht 5' 2"   (1.575 m)   Wt 155 lb (70.3 kg)   SpO2 100%   BMI 28.35 kg/m   BP/Weight 04/03/2017 03/07/2017 39/05/90  Systolic BP 330 076 226  Diastolic BP 78 80 84  Wt. (Lbs) 155 - -  BMI 28.35 - -  Some encounter information is confidential and restricted. Go to Review Flowsheets activity to see all data.     Physical Exam  Eyes: Conjunctivae are normal. Pupils are equal, round, and reactive to light.  Cardiovascular: Normal rate, regular rhythm, normal heart sounds and intact distal pulses.  Pulses:      Dorsalis pedis pulses are 1+ on the left side.  Pulmonary/Chest: Effort normal and breath sounds normal.  Abdominal: Soft. Bowel sounds are normal.  Musculoskeletal:       Right foot: There is swelling (mild, non-pitting).  Skin: Skin is warm and dry.  Surgical pressure dressing in place with ACE wrap.   Nursing note and vitals reviewed.    Assessment & Plan:   1. DM (diabetes mellitus), type 2, uncontrolled, periph vascular complic (HCC) D/c SSI insulin due to patient's non-adherence with SSI and checking CBG's prior to administering SSI coverage Start checking CBG TID bring glucometer and log to next office visit. Discussed importance of dietary changes in managing diabetes. She has seen diabetic nutritionist before. Follow up with clinical pharmacist in 1 week. Follow up with PCP in 6 weeks. Will consult with case management regarding assistance options. - Glucose (CBG) - sitaGLIPtin (JANUVIA) 100 MG tablet; Take 1 tablet (100 mg total) by mouth daily.  Dispense: 30 tablet; Refill: 2 - Insulin Pen Needle 32G X 4 MM MISC; 1 kit by Does not apply route once for 1 dose.  Dispense: 100 each; Refill: 11 - Insulin Isophane & Regular Human (NOVOLIN 70/30 FLEXPEN) (70-30) 100 UNIT/ML PEN; Inject 10 Units into the skin 2 (two) times daily with a meal. Do not take if CBG less than 100.  Dispense: 15 mL; Refill: 4  2. Schizophrenia, unspecified type ([place])  - Ambulatory referral to  Psychiatry  3. Anxiety and depression  - Ambulatory referral to Psychiatry      Follow-up: Return in about 1 week (around 04/10/2017) for DM with Stacy.   Alfonse Spruce FNP

## 2017-04-04 ENCOUNTER — Encounter: Payer: Self-pay | Admitting: Pharmacist

## 2017-04-04 ENCOUNTER — Other Ambulatory Visit: Payer: Self-pay | Admitting: Family Medicine

## 2017-04-04 DIAGNOSIS — E1165 Type 2 diabetes mellitus with hyperglycemia: Secondary | ICD-10-CM

## 2017-04-04 NOTE — Progress Notes (Signed)
PA submitted to [facility] [employer] for Januvia. Pending review. Confirmation #[id number] W

## 2017-04-05 ENCOUNTER — Ambulatory Visit (INDEPENDENT_AMBULATORY_CARE_PROVIDER_SITE_OTHER): Payer: Medicaid Other | Admitting: Orthopedic Surgery

## 2017-04-05 ENCOUNTER — Encounter (INDEPENDENT_AMBULATORY_CARE_PROVIDER_SITE_OTHER): Payer: Self-pay | Admitting: Orthopedic Surgery

## 2017-04-05 ENCOUNTER — Other Ambulatory Visit: Payer: Self-pay | Admitting: Pharmacist

## 2017-04-05 DIAGNOSIS — Z89421 Acquired absence of other right toe(s): Secondary | ICD-10-CM

## 2017-04-05 MED ORDER — INSULIN ISOPHANE & REGULAR (HUMAN 70-30)100 UNIT/ML KWIKPEN: 10.0000 [IU] | PEN_INJECTOR | Freq: Two times a day (BID) | SUBCUTANEOUS | 4 refills | Status: DC

## 2017-04-05 NOTE — Progress Notes (Signed)
Office Visit Note   Patient: Gina [name]           Date of Birth: 1960/08/05           MRN: [id number] Visit Date: 04/05/2017              Requested by: Alfonse Spruce, [place], [place] [address] PCP: Alfonse Spruce, FNP  Chief Complaint  Patient presents with  . Right Foot - Follow-up      HPI: Patient presents in follow-up for an amputation right foot third toe.  Patient has increased venous stasis swelling and wound dehiscence.  Assessment & Plan: Visit Diagnoses: No diagnosis found.  Plan: We will apply a silver alginate plus a 3 layer Dynaflex compressive wrap.  Follow-up on Monday or Tuesday to change the wrap.  Again discussed the importance of elevation.  Follow-Up Instructions: Return in about 1 week (around 04/12/2017).   Ortho Exam  Patient is alert, oriented, no adenopathy, well-dressed, normal affect, normal respiratory effort. Examination patient has significant venous stasis swelling in her right lower extremity she has pitting edema up to the tibial tubercle her calf is tight there is no drainage from the calf there is an odor from the wound with wound dehiscence from the swelling.  There is no cellulitis there is some brawny skin color changes.  Imaging: No results found. No images are attached to the encounter.  Labs: Lab Results  Component Value Date   HGBA1C 15.4 (H) 02/22/2017   HGBA1C 14.3 (H) 06/01/2016   HGBA1C >15 04/28/2016   ESRSEDRATE 105 (H) 02/22/2017   ESRSEDRATE 114 (H) 06/01/2016   ESRSEDRATE 87 (H) 05/23/2016   CRP 7.7 (H) 02/23/2017   CRP 5.3 (H) 06/01/2016   CRP 5.9 (H) 05/23/2016   LABURIC 3.8 06/04/2016   LABURIC 4.1 06/01/2016   REPTSTATUS 02/27/2017 FINAL 02/23/2017   GRAMSTAIN  02/23/2017    ABUNDANT WBC PRESENT, PREDOMINANTLY PMN ABUNDANT GRAM POSITIVE COCCI FEW GRAM NEGATIVE RODS    CULT  02/23/2017    RARE STAPHYLOCOCCUS AUREUS MIXED ANAEROBIC FLORA PRESENT.  CALL LAB IF FURTHER  IID REQUIRED.    [place] 02/23/2017    @LABSALLVALUES (HGBA1)@  There is no height or weight on file to calculate BMI.  Orders:  No orders of the defined types were placed in this encounter.  No orders of the defined types were placed in this encounter.    Procedures: No procedures performed  Clinical Data: No additional findings.  ROS:  All other systems negative, except as noted in the HPI. Review of Systems  Objective: Vital Signs: There were no vitals taken for this visit.  Specialty Comments:  No specialty comments available.  PMFS History: Patient Active Problem List   Diagnosis Date Noted  . Toe amputation status, right ([place]) 03/16/2017  . Sepsis ([place]) 02/22/2017  . Schizophrenia ([place])   . S/P transmetatarsal amputation of foot, left ([place]) 06/15/2016  . DM (diabetes mellitus), type 2, uncontrolled, periph vascular complic ([place]) 09/73/5329  . Anemia of chronic disease 06/07/2016  . Protein-calorie malnutrition, severe 06/03/2016  . Osteomyelitis due to type 2 diabetes mellitus ([place]) 06/02/2016  . Colon cancer screening 02/17/2014  . Breast cancer screening 02/17/2014  . Diabetes ([place]) 07/28/2013  . Cholelithiases 07/28/2013  . DKA (diabetic ketoacidoses) ([place]) 06/18/2013  . HTN (hypertension) 06/18/2013  . Cellulitis of female genitalia 08/03/2012  . Hyperglycemia 07/31/2012  . Candidal skin infection 07/31/2012   Past Medical History:  Diagnosis  Date  . Diabetes mellitus   . Hypertension   . Osteomyelitis of right foot ([place]) 02/22/2017  . Schizophrenia ([place])   . Septic arthritis of interphalangeal joint of toe of left foot ([place]) 06/02/2016    Family History  Problem Relation Age of Onset  . Diabetes type II Father   . CAD Father   . Prostate cancer Father   . Diabetes Mellitus II Brother     Past Surgical History:  Procedure Laterality Date  . AMPUTATION Left 06/05/2016   Procedure: LEFT FOOT TRANSMETATARSAL AMPUTATION;   Surgeon: [name], MD;  Location: [facility];  Service: Orthopedics;  Laterality: Left;  . AMPUTATION TOE Right 02/23/2017   Procedure: AMPUTATION RIGHT THIRD TOE;  Surgeon: [name], MD;  Location: [place];  Service: Orthopedics;  Laterality: Right;  . TUBAL LIGATION     Social History   Occupational History  . Not on file  Tobacco Use  . Smoking status: Never Smoker  . Smokeless tobacco: Never Used  Substance and Sexual Activity  . Alcohol use: No    Comment: occasionally  . Drug use: No  . Sexual activity: Not on file

## 2017-04-06 ENCOUNTER — Other Ambulatory Visit: Payer: Self-pay | Admitting: Pharmacist

## 2017-04-06 LAB — GLUCOSE, POCT (MANUAL RESULT ENTRY): POC Glucose: 106 mg/dl — AB (ref 70–99)

## 2017-04-06 MED ORDER — INSULIN NPH ISOPHANE & REGULAR (70-30) 100 UNIT/ML ~~LOC~~ SUSP: 10.0000 [IU] | Freq: Two times a day (BID) | SUBCUTANEOUS | 11 refills | Status: DC

## 2017-04-06 MED ORDER — "INSULIN SYRINGE-NEEDLE U-100 31G X 15/64"" 0.5 ML MISC": 2 refills | Status: DC

## 2017-04-09 ENCOUNTER — Telehealth: Payer: Self-pay

## 2017-04-09 NOTE — Telephone Encounter (Signed)
Supporting documentation for wheelchair faxed to [facility] - attention: Corene Cornea.

## 2017-04-10 ENCOUNTER — Ambulatory Visit: Payer: Medicaid Other | Admitting: Pharmacist

## 2017-04-10 ENCOUNTER — Telehealth (INDEPENDENT_AMBULATORY_CARE_PROVIDER_SITE_OTHER): Payer: Self-pay | Admitting: Orthopedic Surgery

## 2017-04-10 ENCOUNTER — Ambulatory Visit (INDEPENDENT_AMBULATORY_CARE_PROVIDER_SITE_OTHER): Payer: Medicaid Other | Admitting: Orthopedic Surgery

## 2017-04-10 NOTE — Telephone Encounter (Signed)
Linus Orn from [facility] called requesting that the [place] orders be faxed over to their office at [phone].  CB#[phone].  Thank you.

## 2017-04-11 ENCOUNTER — Telehealth: Payer: Self-pay | Admitting: Family Medicine

## 2017-04-11 NOTE — Telephone Encounter (Signed)
Call placed to [facility] [phone] and spoke with Jiles Crocker regarding patient's wheelchair. Corene Cornea confirmed that he received all the documents faxed to him and wheelchair was approved. Patient was contacted and informed of this. Patient plans on picking up chair today 04/11/2017.

## 2017-04-11 NOTE — Telephone Encounter (Signed)
Pt has an appt with Junie Panning tomorrow and will hold this message pending her appt and advise of updated orders at that time.

## 2017-04-11 NOTE — Telephone Encounter (Signed)
Call placed to patient's daughter [name] [phone] regarding PCS services. No answer. Left [name] a message to return my call at [phone]. She can speak with Opal Sidles as well.

## 2017-04-12 ENCOUNTER — Ambulatory Visit: Payer: Medicaid Other | Admitting: Pharmacist

## 2017-04-12 ENCOUNTER — Ambulatory Visit (INDEPENDENT_AMBULATORY_CARE_PROVIDER_SITE_OTHER): Payer: Medicaid Other | Admitting: Family

## 2017-04-12 ENCOUNTER — Encounter (INDEPENDENT_AMBULATORY_CARE_PROVIDER_SITE_OTHER): Payer: Self-pay | Admitting: Family

## 2017-04-12 DIAGNOSIS — E1169 Type 2 diabetes mellitus with other specified complication: Secondary | ICD-10-CM

## 2017-04-12 DIAGNOSIS — IMO0002 Reserved for concepts with insufficient information to code with codable children: Secondary | ICD-10-CM

## 2017-04-12 DIAGNOSIS — M869 Osteomyelitis, unspecified: Secondary | ICD-10-CM

## 2017-04-12 DIAGNOSIS — Z89421 Acquired absence of other right toe(s): Secondary | ICD-10-CM

## 2017-04-12 NOTE — Progress Notes (Signed)
Office Visit Note   Patient: Gina [name]           Date of Birth: [birth year]           MRN: [id number] Visit Date: 04/12/2017              Requested by: Alfonse Spruce, [place], [place] [address] PCP: Alfonse Spruce, FNP  No chief complaint on file.     HPI: Patient presents in follow-up for an amputation right foot third toe.  Patient has improvement in venous stasis swelling from compression wrap. Does have drainage. Wrap has been in place since 04/05/17.  Has wound dehiscence.  Assessment & Plan: Visit Diagnoses: No diagnosis found.  Plan: We will apply a silver alginate plus a 3 layer Dynaflex compressive wrap.  Discussed importance of  Elevation and nonweight bearing. Discussed if cannot get this to heal or has worsening may require further amputation. Will have [place] assist with wraps and wound care until follow up with Dr. Sharol Given on 04/24/17.  Follow-Up Instructions: No Follow-up on file.   Ortho Exam  Patient is alert, oriented, no adenopathy, well-dressed, normal affect, normal respiratory effort. Examination patient has significant venous stasis swelling in her right lower extremity she has good wrinkling of skin with the compression wrap. there is no drainage from the calf.  there is an odor from the wound with wound dehiscence from the swelling. Probes 10 mm deep. Does not probe to bone. Maceration surrounding wound/incision. No purulence today. There is no cellulitis there is some brawny skin color changes.  Imaging: No results found. No images are attached to the encounter.  Labs: Lab Results  Component Value Date   HGBA1C 15.4 (H) 02/22/2017   HGBA1C 14.3 (H) 06/01/2016   HGBA1C >15 04/28/2016   ESRSEDRATE 105 (H) 02/22/2017   ESRSEDRATE 114 (H) 06/01/2016   ESRSEDRATE 87 (H) 05/23/2016   CRP 7.7 (H) 02/23/2017   CRP 5.3 (H) 06/01/2016   CRP 5.9 (H) 05/23/2016   LABURIC 3.8 06/04/2016   LABURIC 4.1 06/01/2016   REPTSTATUS  02/27/2017 FINAL 02/23/2017   GRAMSTAIN  02/23/2017    ABUNDANT WBC PRESENT, PREDOMINANTLY PMN ABUNDANT GRAM POSITIVE COCCI FEW GRAM NEGATIVE RODS    CULT  02/23/2017    RARE STAPHYLOCOCCUS AUREUS MIXED ANAEROBIC FLORA PRESENT.  CALL LAB IF FURTHER IID REQUIRED.    [place] 02/23/2017    @LABSALLVALUES (HGBA1)@  There is no height or weight on file to calculate BMI.  Orders:  No orders of the defined types were placed in this encounter.  No orders of the defined types were placed in this encounter.    Procedures: No procedures performed  Clinical Data: No additional findings.  ROS:  All other systems negative, except as noted in the HPI. Review of Systems  Constitutional: Negative for chills and fever.  Cardiovascular: Positive for leg swelling.  Skin: Positive for wound. Negative for color change.    Objective: Vital Signs: There were no vitals taken for this visit.  Specialty Comments:  No specialty comments available.  PMFS History: Patient Active Problem List   Diagnosis Date Noted  . Toe amputation status, right ([place]) 03/16/2017  . Sepsis ([place]) 02/22/2017  . Schizophrenia ([place])   . S/P transmetatarsal amputation of foot, left ([place]) 06/15/2016  . DM (diabetes mellitus), type 2, uncontrolled, periph vascular complic ([place]) 98/33/8250  . Anemia of chronic disease 06/07/2016  . Protein-calorie malnutrition, severe 06/03/2016  . Osteomyelitis due to  type 2 diabetes mellitus ([place]) 06/02/2016  . Colon cancer screening 02/17/2014  . Breast cancer screening 02/17/2014  . Diabetes ([place]) 07/28/2013  . Cholelithiases 07/28/2013  . DKA (diabetic ketoacidoses) ([place]) 06/18/2013  . HTN (hypertension) 06/18/2013  . Cellulitis of female genitalia 08/03/2012  . Hyperglycemia 07/31/2012  . Candidal skin infection 07/31/2012   Past Medical History:  Diagnosis Date  . Diabetes mellitus   . Hypertension   . Osteomyelitis of right foot ([place])  02/22/2017  . Schizophrenia ([place])   . Septic arthritis of interphalangeal joint of toe of left foot ([place]) 06/02/2016    Family History  Problem Relation Age of Onset  . Diabetes type II Father   . CAD Father   . Prostate cancer Father   . Diabetes Mellitus II Brother     Past Surgical History:  Procedure Laterality Date  . AMPUTATION Left 06/05/2016   Procedure: LEFT FOOT TRANSMETATARSAL AMPUTATION;  Surgeon: [name], MD;  Location: [facility];  Service: Orthopedics;  Laterality: Left;  . AMPUTATION TOE Right 02/23/2017   Procedure: AMPUTATION RIGHT THIRD TOE;  Surgeon: [name], MD;  Location: [employer] [facility];  Service: Orthopedics;  Laterality: Right;  . TUBAL LIGATION     Social History   Occupational History  . Not on file  Tobacco Use  . Smoking status: Never Smoker  . Smokeless tobacco: Never Used  Substance and Sexual Activity  . Alcohol use: No    Comment: occasionally  . Drug use: No  . Sexual activity: Not on file

## 2017-04-13 ENCOUNTER — Telehealth: Payer: Self-pay | Admitting: Family Medicine

## 2017-04-13 ENCOUNTER — Telehealth (INDEPENDENT_AMBULATORY_CARE_PROVIDER_SITE_OTHER): Payer: Self-pay | Admitting: Orthopedic Surgery

## 2017-04-13 NOTE — Telephone Encounter (Signed)
[facility] called requesting these orders now since they will be seeing patient today. Please fax to # [phone].

## 2017-04-13 NOTE — Telephone Encounter (Signed)
Destinee from home care called and requested for a verbal order for medical supplies. Please fu

## 2017-04-13 NOTE — Telephone Encounter (Signed)
Returned call and lvm giving verbal order per [facility] and left call back number if she has any questions or concerns

## 2017-04-13 NOTE — Telephone Encounter (Signed)
Call placed to patient's daughter [phone] regarding patient's PCS service hours. No answer. Left [name] a message asking her to return my call at [phone]. [name] can speak with Opal Sidles as well.

## 2017-04-13 NOTE — Telephone Encounter (Signed)
Order faxed to [facility].

## 2017-04-13 NOTE — Telephone Encounter (Signed)
Error, other note encountered

## 2017-04-13 NOTE — Telephone Encounter (Signed)
Destinee returned my call and stated pt was wanted supplies from them.  Diabetics testing supplies and incontinence supplies for bladder leaking  200 medium pull up's and Meter, strips, control solution, lancets and a lancet device   Destinee stated she will fax over a copy of the order so provider can sign it so that she can get her supplies each month

## 2017-04-17 ENCOUNTER — Telehealth (INDEPENDENT_AMBULATORY_CARE_PROVIDER_SITE_OTHER): Payer: Self-pay | Admitting: *Deleted

## 2017-04-17 ENCOUNTER — Other Ambulatory Visit: Payer: Self-pay | Admitting: Family Medicine

## 2017-04-17 NOTE — Telephone Encounter (Signed)
[place] nurse went to pt home today and noticed that pt had smell from wound in the entire house. States pt has not changed it since last time in office. Nurse says the wound does not look like it is healing and has some drainage. Wants to know if can go to home x3/week vs X2/week to help with changing dressing. She went today and will go back on Thursday and can go on Saturday(if your ok with it).   [place] nurse wants to know if can also get a profor lift wrap. Please advise.   CB: [phone]

## 2017-04-18 ENCOUNTER — Encounter: Payer: Self-pay | Admitting: Pharmacist

## 2017-04-18 ENCOUNTER — Ambulatory Visit: Payer: Medicaid Other | Attending: Family Medicine | Admitting: Pharmacist

## 2017-04-18 DIAGNOSIS — Z794 Long term (current) use of insulin: Secondary | ICD-10-CM | POA: Diagnosis not present

## 2017-04-18 DIAGNOSIS — IMO0002 Reserved for concepts with insufficient information to code with codable children: Secondary | ICD-10-CM

## 2017-04-18 DIAGNOSIS — E1151 Type 2 diabetes mellitus with diabetic peripheral angiopathy without gangrene: Secondary | ICD-10-CM | POA: Diagnosis not present

## 2017-04-18 DIAGNOSIS — E1165 Type 2 diabetes mellitus with hyperglycemia: Secondary | ICD-10-CM | POA: Diagnosis not present

## 2017-04-18 LAB — GLUCOSE, POCT (MANUAL RESULT ENTRY): POC GLUCOSE: 90 mg/dL (ref 70–99)

## 2017-04-18 MED ORDER — INSULIN GLARGINE 100 UNIT/ML ~~LOC~~ SOLN: 20.0000 [IU] | Freq: Every day | SUBCUTANEOUS | 2 refills | Status: DC

## 2017-04-18 NOTE — Telephone Encounter (Signed)
Called and sw HHN to give verbal ok for orders below. To call with any questions.

## 2017-04-18 NOTE — Progress Notes (Signed)
    S:     No chief complaint on file.   Patient arrives in good spirits.  Presents for diabetes evaluation, education, and management.  Patient denies adherence with medications.  Current diabetes medications include: Humulin 70/30 10 units BID, Januvia 100 mg daily. However, she is still taking Lantus 20 units and Novolog up to 15 units and never started the Humulin. She did not know that her medications had been changed at the last visit.   Patient denies hypoglycemic events.  Patient reported dietary habits: Trying to eat better. She did have some ribs during the superbowl but otherwise, it eating [place] bacon and eggs and less carbs.  Patient reported exercise habits: none   Patient denies nocturia.  Patient reports neuropathy. Patient denies visual changes. Patient reports self foot exams.   She did not bring her meter with her.  She speaks a lot about the stress of losing her brother and cousin and how it has motivated her now to try to take better care of herself.   O:  Physical Exam   ROS   Lab Results  Component Value Date   HGBA1C 15.4 (H) 02/22/2017   There were no vitals filed for this visit.  Home fasting CBG:130s-150s 2 hour post-prandial/random CBG: 100s-200s   A/P: Diabetes longstanding currently uncontrolled based on A1c of 15.4. Patient denies hypoglycemic events and is able to verbalize appropriate hypoglycemia management plan. Patient denies adherence with medication. Control is suboptimal due to dietary indiscretion and poor health literacy.   Based on the numbers she is telling me and her poor health literacy, I think she should stay on the Lantus. Continue 20 units daily. I told her to hold the Novolog for now until I can actually see her log book or meter. She seems to do ok with taking the Novolog but maybe the SSI is too confusing and she would benefit from a set dose. Will determine at next visit. Continue Januvia.   Next A1C anticipated  March 2019.    Written patient instructions provided.  Total time in face to face counseling 15 minutes. Follow up in Pharmacist Clinic Visit  In 2 weeks

## 2017-04-18 NOTE — Patient Instructions (Addendum)
Thanks for coming to see me  You can continue the Lantus but hold the Novolog until you come back to see me.  Gina [name] had wanted you to take the Humulin (a different type of insulin) but I am stopping that for now so do not take that  Come back in 2 weeks and bring your meter   Hypoglycemia Hypoglycemia is when the sugar (glucose) level in the blood is too low. Symptoms of low blood sugar may include:  Feeling: ? Hungry. ? Worried or nervous (anxious). ? Sweaty and clammy. ? Confused. ? Dizzy. ? Sleepy. ? Sick to your stomach (nauseous).  Having: ? A fast heartbeat. ? A headache. ? A change in your vision. ? Jerky movements that you cannot control (seizure). ? Nightmares. ? Tingling or no feeling (numbness) around the mouth, lips, or tongue.  Having trouble with: ? Talking. ? Paying attention (concentrating). ? Moving (coordination). ? Sleeping.  Shaking.  Passing out (fainting).  Getting upset easily (irritability).  Low blood sugar can happen to people who have diabetes and people who do not have diabetes. Low blood sugar can happen quickly, and it can be an emergency. Treating Low Blood Sugar Low blood sugar is often treated by eating or drinking something sugary right away. If you can think clearly and swallow safely, follow the 15:15 rule:  Take 15 grams of a fast-acting carb (carbohydrate). Some fast-acting carbs are: ? 1 tube of glucose gel. ? 3 sugar tablets (glucose pills). ? 6-8 pieces of hard candy. ? 4 oz (120 mL) of fruit juice. ? 4 oz (120 mL) of regular (not diet) soda.  Check your blood sugar 15 minutes after you take the carb.  If your blood sugar is still at or below 70 mg/dL (3.9 mmol/L), take 15 grams of a carb again.  If your blood sugar does not go above 70 mg/dL (3.9 mmol/L) after 3 tries, get help right away.  After your blood sugar goes back to normal, eat a meal or a snack within 1 hour.  Treating Very Low Blood Sugar If your  blood sugar is at or below 54 mg/dL (3 mmol/L), you have very low blood sugar (severe hypoglycemia). This is an emergency. Do not wait to see if the symptoms will go away. Get medical help right away. Call your local emergency services (911 in the [place] Do not drive yourself to the hospital. If you have very low blood sugar and you cannot eat or drink, you may need a glucagon shot (injection). A family member or friend should learn how to check your blood sugar and how to give you a glucagon shot. Ask your doctor if you need to have a glucagon shot kit at home. Follow these instructions at home: General instructions  Avoid any diets that cause you to not eat enough food. Talk with your doctor before you start any new diet.  Take over-the-counter and prescription medicines only as told by your doctor.  Limit alcohol to no more than 1 drink per day for nonpregnant women and 2 drinks per day for men. One drink equals 12 oz of beer, 5 oz of wine, or 1 oz of hard liquor.  Keep all follow-up visits as told by your doctor. This is important. If You Have Diabetes:   Make sure you know the symptoms of low blood sugar.  Always keep a source of sugar with you, such as: ? Sugar. ? Sugar tablets. ? Glucose gel. ? Fruit juice. ? Regular  soda (not diet soda). ? Milk. ? Hard candy. ? Honey.  Take your medicines as told.  Follow your exercise and meal plan. ? Eat on time. Do not skip meals. ? Follow your sick day plan when you cannot eat or drink normally. Make this plan ahead of time with your doctor.  Check your blood sugar as often as told by your doctor. Always check before and after exercise.  Share your diabetes care plan with: ? Your work or school. ? People you live with.  Check your pee (urine) for ketones: ? When you are sick. ? As told by your doctor.  Carry a card or wear jewelry that says you have diabetes. If You Have Low Blood Sugar From Other Causes:   Check your blood  sugar as often as told by your doctor.  Follow instructions from your doctor about what you cannot eat or drink. Contact a doctor if:  You have trouble keeping your blood sugar in your target range.  You have low blood sugar often. Get help right away if:  You still have symptoms after you eat or drink something sugary.  Your blood sugar is at or below 54 mg/dL (3 mmol/L).  You have jerky movements that you cannot control.  You pass out. These symptoms may be an emergency. Do not wait to see if the symptoms will go away. Get medical help right away. Call your local emergency services (911 in the [place] Do not drive yourself to the hospital. This information is not intended to replace advice given to you by your health care provider. Make sure you discuss any questions you have with your health care provider. Document Released: 05/24/2009 Document Revised: 08/05/2015 Document Reviewed: 04/02/2015 Elsevier Interactive Patient Education  [employer].

## 2017-04-19 ENCOUNTER — Telehealth: Payer: Self-pay | Admitting: Family Medicine

## 2017-04-19 NOTE — Telephone Encounter (Signed)
Faxed forms to [facility].

## 2017-04-19 NOTE — Telephone Encounter (Signed)
Shanon Brow from [facility] called about paperwork faxed 04/13/17 regarding patients diabetic supplies. Shanon Brow was informed of the request for at least  2 weeks to complete any paperwork. Please fu.

## 2017-04-19 NOTE — Telephone Encounter (Signed)
Paperwork is in [employer]. Please fax.

## 2017-04-24 ENCOUNTER — Other Ambulatory Visit: Payer: Self-pay | Admitting: Family Medicine

## 2017-04-26 DIAGNOSIS — N393 Stress incontinence (female) (male): Secondary | ICD-10-CM

## 2017-04-26 HISTORY — DX: Stress incontinence (female) (male): N39.3

## 2017-04-30 ENCOUNTER — Telehealth: Payer: Self-pay | Admitting: Family Medicine

## 2017-04-30 ENCOUNTER — Ambulatory Visit (INDEPENDENT_AMBULATORY_CARE_PROVIDER_SITE_OTHER): Payer: Medicaid Other | Admitting: Orthopedic Surgery

## 2017-04-30 ENCOUNTER — Other Ambulatory Visit: Payer: Self-pay | Admitting: Family Medicine

## 2017-04-30 ENCOUNTER — Encounter (INDEPENDENT_AMBULATORY_CARE_PROVIDER_SITE_OTHER): Payer: Self-pay | Admitting: Orthopedic Surgery

## 2017-04-30 VITALS — Ht 62.0 in | Wt 155.0 lb

## 2017-04-30 DIAGNOSIS — F418 Other specified anxiety disorders: Secondary | ICD-10-CM

## 2017-04-30 DIAGNOSIS — D500 Iron deficiency anemia secondary to blood loss (chronic): Secondary | ICD-10-CM

## 2017-04-30 DIAGNOSIS — IMO0002 Reserved for concepts with insufficient information to code with codable children: Secondary | ICD-10-CM

## 2017-04-30 DIAGNOSIS — E1151 Type 2 diabetes mellitus with diabetic peripheral angiopathy without gangrene: Secondary | ICD-10-CM

## 2017-04-30 DIAGNOSIS — R809 Proteinuria, unspecified: Secondary | ICD-10-CM

## 2017-04-30 DIAGNOSIS — Z89421 Acquired absence of other right toe(s): Secondary | ICD-10-CM

## 2017-04-30 DIAGNOSIS — E1165 Type 2 diabetes mellitus with hyperglycemia: Secondary | ICD-10-CM

## 2017-04-30 MED ORDER — LISINOPRIL 2.5 MG PO TABS: 2.5000 mg | ORAL_TABLET | Freq: Every day | ORAL | 2 refills | Status: DC

## 2017-04-30 MED ORDER — SITAGLIPTIN PHOSPHATE 100 MG PO TABS: 100.0000 mg | ORAL_TABLET | Freq: Every day | ORAL | 2 refills | Status: DC

## 2017-04-30 MED ORDER — ESCITALOPRAM OXALATE 20 MG PO TABS: 20.0000 mg | ORAL_TABLET | Freq: Every day | ORAL | 2 refills | Status: DC

## 2017-04-30 MED ORDER — FERROUS SULFATE 325 (65 FE) MG PO TABS: 325.0000 mg | ORAL_TABLET | Freq: Two times a day (BID) | ORAL | 2 refills | Status: DC

## 2017-04-30 NOTE — Telephone Encounter (Signed)
Patient needs physician order completed for patients inconstance supplies to be returned to office. Fax # [phone]

## 2017-04-30 NOTE — Telephone Encounter (Signed)
Pt called to request a refill for sitaGLIPtin (JANUVIA) 100 MG tablet escitalopram (LEXAPRO) 20 MG tablet lisinopril (PRINIVIL,ZESTRIL) 2.5 MG tablet ferrous sulfate 325 (65 FE) MG tablet   Please sent to since it to [id number] - [place], [place] Please follow up

## 2017-04-30 NOTE — Telephone Encounter (Signed)
Refilled

## 2017-04-30 NOTE — Telephone Encounter (Signed)
PCP will give paperwork for CMA to fax.

## 2017-05-03 ENCOUNTER — Encounter: Payer: Self-pay | Admitting: Pharmacist

## 2017-05-03 ENCOUNTER — Ambulatory Visit: Payer: Medicaid Other | Attending: Family Medicine | Admitting: Pharmacist

## 2017-05-03 ENCOUNTER — Encounter (INDEPENDENT_AMBULATORY_CARE_PROVIDER_SITE_OTHER): Payer: Self-pay | Admitting: Orthopedic Surgery

## 2017-05-03 DIAGNOSIS — E1165 Type 2 diabetes mellitus with hyperglycemia: Secondary | ICD-10-CM | POA: Insufficient documentation

## 2017-05-03 DIAGNOSIS — IMO0002 Reserved for concepts with insufficient information to code with codable children: Secondary | ICD-10-CM

## 2017-05-03 DIAGNOSIS — D500 Iron deficiency anemia secondary to blood loss (chronic): Secondary | ICD-10-CM | POA: Diagnosis not present

## 2017-05-03 DIAGNOSIS — E1151 Type 2 diabetes mellitus with diabetic peripheral angiopathy without gangrene: Secondary | ICD-10-CM | POA: Insufficient documentation

## 2017-05-03 LAB — GLUCOSE, POCT (MANUAL RESULT ENTRY): POC Glucose: 249 mg/dl — AB (ref 70–99)

## 2017-05-03 MED ORDER — INSULIN GLARGINE 100 UNIT/ML SOLOSTAR PEN: 20.0000 [IU] | PEN_INJECTOR | Freq: Every day | SUBCUTANEOUS | 0 refills | Status: DC

## 2017-05-03 NOTE — Progress Notes (Signed)
Office Visit Note   Patient: Gina [name]           Date of Birth: Oct 15, 1960           MRN: [id number] Visit Date: 04/30/2017              Requested by: Alfonse Spruce, [address], [place] [address] PCP: Alfonse Spruce, FNP  Chief Complaint  Patient presents with  . Right Foot - Routine Post Op    02/23/17 right foot 3rd toe amputation       HPI: Patient presents in follow-up for an amputation right foot third toe.  Patient has improvement in venous stasis swelling from compression wrap. Much less odor and drainage.   Has wound dehiscence.  Assessment & Plan: Visit Diagnoses:  1. Toe amputation status, right ([place])     Plan: We will apply a silver alginate plus a 3 layer Dynaflex compressive wrap.  Discussed importance of  Elevation and nonweight bearing. Discussed if cannot get this to heal or has worsening may require further amputation. Will have Boyd assist with wraps and wound care until next follow up with Dr. Sharol Given.  Follow-Up Instructions: No Follow-up on file.   Ortho Exam  Patient is alert, oriented, no adenopathy, well-dressed, normal affect, normal respiratory effort. Examination patient has significant venous stasis swelling in her right lower extremity she has good wrinkling of skin with the compression wrap. there is no drainage from the calf.  There is an odor from moisture. Dehiscence of incision, is just 4 mm deep. Is improving. Improved granulation. Does not probe to bone. Maceration surrounding wound/incision. No purulence today. There is no cellulitis there is some brawny skin color changes.  Imaging: No results found. No images are attached to the encounter.  Labs: Lab Results  Component Value Date   HGBA1C 15.4 (H) 02/22/2017   HGBA1C 14.3 (H) 06/01/2016   HGBA1C >15 04/28/2016   ESRSEDRATE 105 (H) 02/22/2017   ESRSEDRATE 114 (H) 06/01/2016   ESRSEDRATE 87 (H) 05/23/2016   CRP 7.7 (H) 02/23/2017   CRP 5.3 (H)  06/01/2016   CRP 5.9 (H) 05/23/2016   LABURIC 3.8 06/04/2016   LABURIC 4.1 06/01/2016   REPTSTATUS 02/27/2017 FINAL 02/23/2017   GRAMSTAIN  02/23/2017    ABUNDANT WBC PRESENT, PREDOMINANTLY PMN ABUNDANT GRAM POSITIVE COCCI FEW GRAM NEGATIVE RODS    CULT  02/23/2017    RARE STAPHYLOCOCCUS AUREUS MIXED ANAEROBIC FLORA PRESENT.  CALL LAB IF FURTHER IID REQUIRED.    LABORGA STAPHYLOCOCCUS AUREUS 02/23/2017    @LABSALLVALUES (HGBA1)@  Body mass index is 28.35 kg/m.  Orders:  No orders of the defined types were placed in this encounter.  No orders of the defined types were placed in this encounter.    Procedures: No procedures performed  Clinical Data: No additional findings.  ROS:  All other systems negative, except as noted in the HPI. Review of Systems  Constitutional: Negative for chills and fever.  Cardiovascular: Positive for leg swelling.  Skin: Positive for wound. Negative for color change.    Objective: Vital Signs: Ht 5\' 2"  (1.575 m)   Wt 155 lb (70.3 kg)   BMI 28.35 kg/m   Specialty Comments:  No specialty comments available.  PMFS History: Patient Active Problem List   Diagnosis Date Noted  . Toe amputation status, right ([place]) 03/16/2017  . Sepsis ([place]) 02/22/2017  . Schizophrenia ([place])   . S/P transmetatarsal amputation of foot, left ([place]) 06/15/2016  . DM (  diabetes mellitus), type 2, uncontrolled, periph vascular complic ([place]) 14/12/3011  . Anemia of chronic disease 06/07/2016  . Protein-calorie malnutrition, severe 06/03/2016  . Osteomyelitis due to type 2 diabetes mellitus ([place]) 06/02/2016  . Colon cancer screening 02/17/2014  . Breast cancer screening 02/17/2014  . Diabetes ([place]) 07/28/2013  . Cholelithiases 07/28/2013  . DKA (diabetic ketoacidoses) ([place]) 06/18/2013  . HTN (hypertension) 06/18/2013  . Cellulitis of female genitalia 08/03/2012  . Hyperglycemia 07/31/2012  . Candidal skin infection 07/31/2012   Past Medical History:    Diagnosis Date  . Diabetes mellitus   . Hypertension   . Osteomyelitis of right foot ([place]) 02/22/2017  . Schizophrenia ([place])   . Septic arthritis of interphalangeal joint of toe of left foot ([place]) 06/02/2016    Family History  Problem Relation Age of Onset  . Diabetes type II Father   . CAD Father   . Prostate cancer Father   . Diabetes Mellitus II Brother     Past Surgical History:  Procedure Laterality Date  . AMPUTATION Left 06/05/2016   Procedure: LEFT FOOT TRANSMETATARSAL AMPUTATION;  Surgeon: [name], MD;  Location: [facility];  Service: Orthopedics;  Laterality: Left;  . AMPUTATION TOE Right 02/23/2017   Procedure: AMPUTATION RIGHT THIRD TOE;  Surgeon: [name], MD;  Location: [facility];  Service: Orthopedics;  Laterality: Right;  . TUBAL LIGATION     Social History   Occupational History  . Not on file  Tobacco Use  . Smoking status: Never Smoker  . Smokeless tobacco: Never Used  Substance and Sexual Activity  . Alcohol use: No    Comment: occasionally  . Drug use: No  . Sexual activity: Not on file

## 2017-05-03 NOTE — Patient Instructions (Addendum)
Thanks for coming to see [place]!  Make appt with PCP for March - bring in all of your medicine to your next appt. Please bring your meter to the next visit - we need to be able to see your readings at home to be able to adjust your medications.    Carbohydrate Counting for Diabetes Mellitus, Adult Carbohydrate counting is a method for keeping track of how many carbohydrates you eat. Eating carbohydrates naturally increases the amount of sugar (glucose) in the blood. Counting how many carbohydrates you eat helps keep your blood glucose within normal limits, which helps you manage your diabetes (diabetes mellitus). It is important to know how many carbohydrates you can safely have in each meal. This is different for every person. A diet and nutrition specialist (registered dietitian) can help you make a meal plan and calculate how many carbohydrates you should have at each meal and snack. Carbohydrates are found in the following foods:  Grains, such as breads and cereals.  Dried beans and soy products.  Starchy vegetables, such as potatoes, peas, and corn.  Fruit and fruit juices.  Milk and yogurt.  Sweets and snack foods, such as cake, cookies, candy, chips, and soft drinks.  How do I count carbohydrates? There are two ways to count carbohydrates in food. You can use either of the methods or a combination of both. Reading "Nutrition Facts" on packaged food The "Nutrition Facts" list is included on the labels of almost all packaged foods and beverages in the [place] It includes:  The serving size.  Information about nutrients in each serving, including the grams (g) of carbohydrate per serving.  To use the "Nutrition Facts":  Decide how many servings you will have.  Multiply the number of servings by the number of carbohydrates per serving.  The resulting number is the total amount of carbohydrates that you will be having.  Learning standard serving sizes of other foods When you eat  foods containing carbohydrates that are not packaged or do not include "Nutrition Facts" on the label, you need to measure the servings in order to count the amount of carbohydrates:  Measure the foods that you will eat with a food scale or measuring cup, if needed.  Decide how many standard-size servings you will eat.  Multiply the number of servings by 15. Most carbohydrate-rich foods have about 15 g of carbohydrates per serving. ? For example, if you eat 8 oz (170 g) of strawberries, you will have eaten 2 servings and 30 g of carbohydrates (2 servings x 15 g = 30 g).  For foods that have more than one food mixed, such as soups and casseroles, you must count the carbohydrates in each food that is included.  The following list contains standard serving sizes of common carbohydrate-rich foods. Each of these servings has about 15 g of carbohydrates:   hamburger bun or  English muffin.   oz (15 mL) syrup.   oz (14 g) jelly.  1 slice of bread.  1 six-inch tortilla.  3 oz (85 g) cooked rice or pasta.  4 oz (113 g) cooked dried beans.  4 oz (113 g) starchy vegetable, such as peas, corn, or potatoes.  4 oz (113 g) hot cereal.  4 oz (113 g) mashed potatoes or  of a large baked potato.  4 oz (113 g) canned or frozen fruit.  4 oz (120 mL) fruit juice.  4-6 crackers.  6 chicken nuggets.  6 oz (170 g) unsweetened dry cereal.  6 oz (170 g) plain fat-free yogurt or yogurt sweetened with artificial sweeteners.  8 oz (240 mL) milk.  8 oz (170 g) fresh fruit or one small piece of fruit.  24 oz (680 g) popped popcorn.  Example of carbohydrate counting Sample meal  3 oz (85 g) chicken breast.  6 oz (170 g) brown rice.  4 oz (113 g) corn.  8 oz (240 mL) milk.  8 oz (170 g) strawberries with sugar-free whipped topping. Carbohydrate calculation 1. Identify the foods that contain carbohydrates: ? Rice. ? Corn. ? Milk. ? Strawberries. 2. Calculate how many servings  you have of each food: ? 2 servings rice. ? 1 serving corn. ? 1 serving milk. ? 1 serving strawberries. 3. Multiply each number of servings by 15 g: ? 2 servings rice x 15 g = 30 g. ? 1 serving corn x 15 g = 15 g. ? 1 serving milk x 15 g = 15 g. ? 1 serving strawberries x 15 g = 15 g. 4. Add together all of the amounts to find the total grams of carbohydrates eaten: ? 30 g + 15 g + 15 g + 15 g = 75 g of carbohydrates total. This information is not intended to replace advice given to you by your health care provider. Make sure you discuss any questions you have with your health care provider. Document Released: 02/27/2005 Document Revised: 09/17/2015 Document Reviewed: 08/11/2015 Elsevier Interactive Patient Education  [employer].

## 2017-05-03 NOTE — Progress Notes (Signed)
    S:     Chief Complaint  Patient presents with  . Medication Management   Patient arrives in good spirits.  Presents for diabetes evaluation, education, and management.  Patient reports adherence with medications.  Current diabetes medications include: Lantus 20 units daily, Januvia 100 mg daily. She wants the Lantus pen and not the needles because she is afraid of syringes.  Patient denies hypoglycemic events.  Patient reported dietary habits: no changes since last visit. She ate pasta at lunch today.   Patient reported exercise habits: none   Patient denies nocturia.  Patient reports neuropathy. Patient denies visual changes. Patient reports self foot exams.   She did not bring her meter with her.  She says she is still stressed about her brother passing but she has support from her children which is helping her to cope.   O:  Physical Exam   ROS   Lab Results  Component Value Date   HGBA1C 15.4 (H) 02/22/2017   There were no vitals filed for this visit.  Home fasting CBG:130s-150s 2 hour post-prandial/random CBG: 100s-200s  POCT glucose = 249 (post-prandial - ate pasta for lunch)   A/P: Diabetes longstanding currently uncontrolled based on A1c of 15.4. Patient denies hypoglycemic events and is able to verbalize appropriate hypoglycemia management plan. Patient denies adherence with medication. Control is suboptimal due to dietary indiscretion and poor health literacy.   I think patient needs to have an increased dose of Lantus but I really need to see her meter at home because the number she gives from home seem fairly well controlled. However, I am not sure that they are accurate. Stressed the importance of bringing her meter to each visit so that we are able to adjust her medications as needed to get her blood sugar to goal. Patient verbalized understanding.   Next A1C anticipated March 2019.    Written patient instructions provided.  Total time in face  to face counseling 15 minutes. Follow up with PCP in 2 weeks. Patient instructed to bring meter and all medications to the visit.

## 2017-05-08 ENCOUNTER — Telehealth (INDEPENDENT_AMBULATORY_CARE_PROVIDER_SITE_OTHER): Payer: Self-pay | Admitting: Orthopedic Surgery

## 2017-05-08 NOTE — Telephone Encounter (Signed)
[name], an RN with [facility] called stating that the patient is rating her pain 8 out of 10 and wanted to know what she can take other than tylenol.  Her CB#[phone].  Thank you.

## 2017-05-08 NOTE — Telephone Encounter (Signed)
Pt is s/p right foot 3rd toe amp 02/23/17. silvercell and dynflex dressing application with HHN stating the pt is rating her pain 8-10 and that she is only taking tylenol.

## 2017-05-08 NOTE — Telephone Encounter (Signed)
If patient's pain is a 10/10 then we need to see her in the office, schedule a follow-up appointment.

## 2017-05-08 NOTE — Telephone Encounter (Signed)
Can you please call pt and make an appt for Thursday? If she is having increased pain she needs to be seen sooner than 05/21/17. thnaks

## 2017-05-10 ENCOUNTER — Ambulatory Visit (INDEPENDENT_AMBULATORY_CARE_PROVIDER_SITE_OTHER): Payer: Medicaid Other | Admitting: Orthopedic Surgery

## 2017-05-21 ENCOUNTER — Ambulatory Visit (INDEPENDENT_AMBULATORY_CARE_PROVIDER_SITE_OTHER): Payer: Medicaid Other | Admitting: Orthopedic Surgery

## 2017-05-22 ENCOUNTER — Ambulatory Visit (INDEPENDENT_AMBULATORY_CARE_PROVIDER_SITE_OTHER): Payer: Medicaid Other | Admitting: Orthopedic Surgery

## 2017-05-22 ENCOUNTER — Encounter (INDEPENDENT_AMBULATORY_CARE_PROVIDER_SITE_OTHER): Payer: Self-pay | Admitting: Orthopedic Surgery

## 2017-05-22 VITALS — Ht 62.0 in | Wt 155.0 lb

## 2017-05-22 DIAGNOSIS — Z89432 Acquired absence of left foot: Secondary | ICD-10-CM

## 2017-05-22 DIAGNOSIS — Z89421 Acquired absence of other right toe(s): Secondary | ICD-10-CM

## 2017-05-22 NOTE — Progress Notes (Signed)
Office Visit Note   Patient: Gina [name]           Date of Birth: 1960-12-11           MRN: [id number] Visit Date: 05/22/2017              Requested by: Alfonse Spruce, FNP No address on file PCP: Alfonse Spruce, FNP  Chief Complaint  Patient presents with  . Right Foot - Routine Post Op, Wound Check    02/23/17 right 3rd toe amputation      HPI: Patient presents in follow-up status post ray amputation right foot status post transmetatarsal amputation left foot.  Patient has had compression wraps for the right lower extremity for the venous swelling.  Assessment & Plan: Visit Diagnoses:  1. Hx of amputation of lesser toe, right ([place])   2. S/P transmetatarsal amputation of foot, left ([place])     Plan: We will have her start wound care the swelling has decreased significantly we will have her wash her foot with Dial soap and water daily apply 4 x 4 gauze to the wound plus an Ace wrap.  We will give her a new postoperative shoe.  Patient states she lost her other shoe.  Patient was given a prescription for [employer] for new extra-depth shoes custom orthotics she is status post a transmetatarsal amputation on the left she will need evaluation for possible new braces on the left and status post ray amputation on the right.  Follow-Up Instructions: Return in about 2 weeks (around 06/05/2017).   Ortho Exam  Patient is alert, oriented, no adenopathy, well-dressed, normal affect, normal respiratory effort. On examination after debridement of the wound there is 100% beefy granulation tissue the wound measures 5 x 10 mm.  There is no exposed bone or tendon this is 3 mm deep.  There is some swelling in the forefoot but the compression wraps have significantly decreased her venous edema.  Imaging: No results found. No images are attached to the encounter.  Labs: Lab Results  Component Value Date   HGBA1C 15.4 (H) 02/22/2017   HGBA1C 14.3 (H) 06/01/2016   HGBA1C >15  04/28/2016   ESRSEDRATE 105 (H) 02/22/2017   ESRSEDRATE 114 (H) 06/01/2016   ESRSEDRATE 87 (H) 05/23/2016   CRP 7.7 (H) 02/23/2017   CRP 5.3 (H) 06/01/2016   CRP 5.9 (H) 05/23/2016   LABURIC 3.8 06/04/2016   LABURIC 4.1 06/01/2016   REPTSTATUS 02/27/2017 FINAL 02/23/2017   GRAMSTAIN  02/23/2017    ABUNDANT WBC PRESENT, PREDOMINANTLY PMN ABUNDANT GRAM POSITIVE COCCI FEW GRAM NEGATIVE RODS    CULT  02/23/2017    RARE STAPHYLOCOCCUS AUREUS MIXED ANAEROBIC FLORA PRESENT.  CALL LAB IF FURTHER IID REQUIRED.    LABORGA STAPHYLOCOCCUS AUREUS 02/23/2017    @LABSALLVALUES (HGBA1)@  Body mass index is 28.35 kg/m.  Orders:  No orders of the defined types were placed in this encounter.  No orders of the defined types were placed in this encounter.    Procedures: No procedures performed  Clinical Data: No additional findings.  ROS:  All other systems negative, except as noted in the HPI. Review of Systems  Objective: Vital Signs: Ht 5\' 2"  (1.575 m)   Wt 155 lb (70.3 kg)   BMI 28.35 kg/m   Specialty Comments:  No specialty comments available.  PMFS History: Patient Active Problem List   Diagnosis Date Noted  . Toe amputation status, right ([place]) 03/16/2017  . Sepsis ([place]) 02/22/2017  . Schizophrenia ([place])   .  S/P transmetatarsal amputation of foot, left ([place]) 06/15/2016  . DM (diabetes mellitus), type 2, uncontrolled, periph vascular complic ([place]) 12/87/8676  . Anemia of chronic disease 06/07/2016  . Protein-calorie malnutrition, severe 06/03/2016  . Osteomyelitis due to type 2 diabetes mellitus ([place]) 06/02/2016  . Colon cancer screening 02/17/2014  . Breast cancer screening 02/17/2014  . Diabetes ([place]) 07/28/2013  . Cholelithiases 07/28/2013  . DKA (diabetic ketoacidoses) ([place]) 06/18/2013  . HTN (hypertension) 06/18/2013  . Cellulitis of female genitalia 08/03/2012  . Hyperglycemia 07/31/2012  . Candidal skin infection 07/31/2012   Past Medical History:    Diagnosis Date  . Diabetes mellitus   . Hypertension   . Osteomyelitis of right foot ([place]) 02/22/2017  . Schizophrenia ([place])   . Septic arthritis of interphalangeal joint of toe of left foot ([place]) 06/02/2016    Family History  Problem Relation Age of Onset  . Diabetes type II Father   . CAD Father   . Prostate cancer Father   . Diabetes Mellitus II Brother     Past Surgical History:  Procedure Laterality Date  . AMPUTATION Left 06/05/2016   Procedure: LEFT FOOT TRANSMETATARSAL AMPUTATION;  Surgeon: [name], MD;  Location: [facility];  Service: Orthopedics;  Laterality: Left;  . AMPUTATION TOE Right 02/23/2017   Procedure: AMPUTATION RIGHT THIRD TOE;  Surgeon: [name], MD;  Location: [facility];  Service: Orthopedics;  Laterality: Right;  . TUBAL LIGATION     Social History   Occupational History  . Not on file  Tobacco Use  . Smoking status: Never Smoker  . Smokeless tobacco: Never Used  Substance and Sexual Activity  . Alcohol use: No    Comment: occasionally  . Drug use: No  . Sexual activity: Not on file

## 2017-05-23 ENCOUNTER — Telehealth: Payer: Self-pay | Admitting: Family Medicine

## 2017-05-23 NOTE — Telephone Encounter (Signed)
3 page, paperwork received through fax 05-23-17. Home care delivered, incomplete form.

## 2017-05-28 ENCOUNTER — Telehealth: Payer: Self-pay | Admitting: Family Medicine

## 2017-05-28 NOTE — Telephone Encounter (Signed)
Patient called to check on the order for her incontinent supplies. Please fu at your earliest convenience. [facility] patient)

## 2017-05-28 NOTE — Telephone Encounter (Signed)
Please advise if you recall seeing any orders or scripts for this need.

## 2017-05-29 ENCOUNTER — Telehealth: Payer: Self-pay | Admitting: Family Medicine

## 2017-05-29 ENCOUNTER — Other Ambulatory Visit: Payer: Self-pay | Admitting: Internal Medicine

## 2017-05-29 NOTE — Telephone Encounter (Signed)
Home Care Delivered fax came in to request medical supplies,Placed in PCP box

## 2017-05-30 ENCOUNTER — Telehealth: Payer: Self-pay | Admitting: Internal Medicine

## 2017-05-30 DIAGNOSIS — N393 Stress incontinence (female) (male): Secondary | ICD-10-CM | POA: Insufficient documentation

## 2017-05-30 NOTE — Telephone Encounter (Signed)
Received fax from Home care delivered for orders,fax will on the pcp in-box

## 2017-05-30 NOTE — Telephone Encounter (Signed)
t called to request a refill for

## 2017-05-31 NOTE — Telephone Encounter (Signed)
Patient information was sent over through [employer] for supplies since home delivered has not yet responded to patients request.

## 2017-06-01 ENCOUNTER — Ambulatory Visit: Payer: Medicaid Other | Admitting: Internal Medicine

## 2017-06-01 ENCOUNTER — Telehealth: Payer: Self-pay | Admitting: Family Medicine

## 2017-06-01 NOTE — Telephone Encounter (Signed)
5 page, paperwork received through fax 06-01-17. ([facility])

## 2017-06-05 ENCOUNTER — Ambulatory Visit (INDEPENDENT_AMBULATORY_CARE_PROVIDER_SITE_OTHER): Payer: Medicaid Other | Admitting: Orthopedic Surgery

## 2017-06-11 ENCOUNTER — Ambulatory Visit: Payer: Medicaid Other | Admitting: Internal Medicine

## 2017-06-14 ENCOUNTER — Ambulatory Visit (INDEPENDENT_AMBULATORY_CARE_PROVIDER_SITE_OTHER): Payer: Medicaid Other | Admitting: Orthopedic Surgery

## 2017-06-19 ENCOUNTER — Telehealth (INDEPENDENT_AMBULATORY_CARE_PROVIDER_SITE_OTHER): Payer: Self-pay

## 2017-06-19 NOTE — Telephone Encounter (Signed)
I called and sw [facility] she states that compression wrap was applied to the LLE but not the right. Pt would not allow her to do that. She states that she does think the pt is up a lot and does not elevate her legs much. I called pt and advised to let [facility] apply dressing and she states that she does not want to do that because she can wear pants if she does and that she would work more on elevation and discuss whn she comes in for her appt with Dr. Sharol Given next week. To call with any questions or changes.

## 2017-06-19 NOTE — Telephone Encounter (Signed)
Gina [name] with [facility] wanted to let Dr. Sharol Given know that patient's wound has healed and looks good, but patient has swelling in both of her legs.  Stated that she applied a compression wrap to patient's left leg.   Patient stated that her insurance will not cover for shoes from [facility] at the moment.  Cb# for Gina [name] is [phone].  Cb# for patient is [phone].  Please advise.  Thank You.

## 2017-06-27 ENCOUNTER — Ambulatory Visit (INDEPENDENT_AMBULATORY_CARE_PROVIDER_SITE_OTHER): Payer: Medicaid Other | Admitting: Orthopedic Surgery

## 2017-07-03 ENCOUNTER — Telehealth: Payer: Self-pay

## 2017-07-03 DIAGNOSIS — E1165 Type 2 diabetes mellitus with hyperglycemia: Secondary | ICD-10-CM

## 2017-07-03 DIAGNOSIS — R809 Proteinuria, unspecified: Secondary | ICD-10-CM

## 2017-07-03 NOTE — Telephone Encounter (Signed)
JA 

## 2017-07-04 MED ORDER — LISINOPRIL 5 MG PO TABS: 5.0000 mg | ORAL_TABLET | Freq: Every day | ORAL | 1 refills | Status: DC

## 2017-07-04 NOTE — Telephone Encounter (Addendum)
Instructions per Dr. Wynetta Emery: Please f/u with caller. I have not seen this pt before. Please inquire who the caller is and whether there is BP log over the past 1-2 wks. If she has been running this high over this time period, then increase Lisinopril from 2.5 mg to 5 mg daily.  Carson Myrtle, with [facility]. She reports pt BP readings have been: 4/23: 182/104: admitted to leg pain 4/18: 132/86 4/15: 154/90 4/9:   150/90 4/4:    138/84 4/1:    144/90   Concerns about BLE +3 pitting edema x 1 week assessed. Per [name], no SOB or DOE. She states she provided education about tight garments and elevation of lower extremities.   Appointment 07/23/17. Unable to reach patient

## 2017-07-04 NOTE — Telephone Encounter (Signed)
Spoke to [place] to inform of dose change to Lisinopril 5mg . Also informed of appointment scheduled on Friday April 26 at 26. Unable to reach patient after 2 attempts. Left message on voicemail.

## 2017-07-04 NOTE — Addendum Note (Signed)
Addended by: Karle Plumber B on: 07/04/2017 12:17 PM   Modules accepted: Orders

## 2017-07-05 NOTE — Telephone Encounter (Signed)
Spoke to patient about medication dose change on Lisinopril. She verbalized understanding that she can pick up  Medication at pharmacy. Pt wishes to have later appointment for tomorrow due to church engagements. Pt informed later appointment were not available at this time.

## 2017-07-06 ENCOUNTER — Ambulatory Visit: Payer: Medicaid Other | Admitting: Internal Medicine

## 2017-07-09 ENCOUNTER — Telehealth (INDEPENDENT_AMBULATORY_CARE_PROVIDER_SITE_OTHER): Payer: Self-pay | Admitting: Orthopedic Surgery

## 2017-07-09 NOTE — Telephone Encounter (Signed)
I faxed last office note. Can you please fax records/ notes for this year to [facility]?

## 2017-07-09 NOTE — Telephone Encounter (Signed)
Gina [name] with [facility] called needing notes faxed for DOS 05/22/17. The fax# is [phone]

## 2017-07-10 NOTE — Telephone Encounter (Signed)
2019 OV notes faxed to [facility] [phone]

## 2017-07-11 ENCOUNTER — Telehealth (INDEPENDENT_AMBULATORY_CARE_PROVIDER_SITE_OTHER): Payer: Self-pay | Admitting: Orthopedic Surgery

## 2017-07-11 NOTE — Telephone Encounter (Signed)
Called and sw [facility] to advise ok for recert [facility] requesting eval for compression socks at next visit.

## 2017-07-11 NOTE — Telephone Encounter (Signed)
[name] from [facility] called to state that patient needed a recerification for Nursing.  #[phone]

## 2017-07-21 ENCOUNTER — Other Ambulatory Visit: Payer: Self-pay | Admitting: Internal Medicine

## 2017-07-23 ENCOUNTER — Other Ambulatory Visit: Payer: Self-pay

## 2017-07-23 ENCOUNTER — Ambulatory Visit: Payer: Medicaid Other | Admitting: Internal Medicine

## 2017-07-23 DIAGNOSIS — D500 Iron deficiency anemia secondary to blood loss (chronic): Secondary | ICD-10-CM

## 2017-07-23 DIAGNOSIS — F418 Other specified anxiety disorders: Secondary | ICD-10-CM

## 2017-07-23 MED ORDER — FERROUS SULFATE 325 (65 FE) MG PO TABS: 325.0000 mg | ORAL_TABLET | Freq: Two times a day (BID) | ORAL | 0 refills | Status: DC

## 2017-07-23 MED ORDER — ESCITALOPRAM OXALATE 20 MG PO TABS: 20.0000 mg | ORAL_TABLET | Freq: Every day | ORAL | 0 refills | Status: DC

## 2017-07-30 ENCOUNTER — Ambulatory Visit (INDEPENDENT_AMBULATORY_CARE_PROVIDER_SITE_OTHER): Payer: Medicaid Other | Admitting: Orthopedic Surgery

## 2017-08-23 ENCOUNTER — Ambulatory Visit: Payer: Medicaid Other | Admitting: Internal Medicine

## 2017-09-10 ENCOUNTER — Ambulatory Visit: Payer: Medicaid Other | Admitting: Internal Medicine

## 2017-10-01 ENCOUNTER — Other Ambulatory Visit: Payer: Self-pay | Admitting: Pharmacist

## 2017-10-01 ENCOUNTER — Ambulatory Visit: Payer: Medicaid Other

## 2017-10-01 ENCOUNTER — Telehealth: Payer: Self-pay | Admitting: Family Medicine

## 2017-10-01 ENCOUNTER — Telehealth: Payer: Self-pay

## 2017-10-01 DIAGNOSIS — R809 Proteinuria, unspecified: Secondary | ICD-10-CM

## 2017-10-01 DIAGNOSIS — IMO0002 Reserved for concepts with insufficient information to code with codable children: Secondary | ICD-10-CM

## 2017-10-01 DIAGNOSIS — E1151 Type 2 diabetes mellitus with diabetic peripheral angiopathy without gangrene: Secondary | ICD-10-CM

## 2017-10-01 DIAGNOSIS — E1165 Type 2 diabetes mellitus with hyperglycemia: Secondary | ICD-10-CM

## 2017-10-01 MED ORDER — SITAGLIPTIN PHOSPHATE 100 MG PO TABS: 100.0000 mg | ORAL_TABLET | Freq: Every day | ORAL | 0 refills | Status: DC

## 2017-10-01 MED ORDER — INSULIN PEN NEEDLE 32G X 4 MM MISC
0 refills | Status: DC
Start: 2017-10-01 — End: 2018-05-21

## 2017-10-01 MED ORDER — LISINOPRIL 5 MG PO TABS: 5.0000 mg | ORAL_TABLET | Freq: Every day | ORAL | 0 refills | Status: DC

## 2017-10-01 MED ORDER — ESCITALOPRAM OXALATE 20 MG PO TABS: 20.0000 mg | ORAL_TABLET | Freq: Every day | ORAL | 0 refills | Status: DC

## 2017-10-01 MED ORDER — "INSULIN SYRINGE-NEEDLE U-100 31G X 15/64"" 0.5 ML MISC": 0 refills | Status: DC

## 2017-10-01 MED FILL — ESCITALOPRAM 20 MG TABLET: 20 | 2 days supply | Qty: 2 | Fill #0

## 2017-10-01 MED FILL — TRUEPLUS PEN NDL 32GX5/32": 32G X 4 MM | 30 days supply | Qty: 100 | Fill #0

## 2017-10-01 MED FILL — LISINOPRIL 5 MG TAB: 5 | 30 days supply | Qty: 30 | Fill #0

## 2017-10-01 MED FILL — JANUVIA 100 MG TABLET: 100 | 30 days supply | Qty: 30 | Fill #0

## 2017-10-01 MED FILL — TRUEPLUS PEN NDL 32GX5/32: 32G X 4 MM | 30 days supply | Qty: 100 | Fill #0

## 2017-10-01 NOTE — Progress Notes (Signed)
Refilled Lexapro to hold patient until she is seen by doctor on Thursday, July 25.

## 2017-10-01 NOTE — Telephone Encounter (Signed)
[facility]'s sent today, re-sent to [facility]

## 2017-10-01 NOTE — Telephone Encounter (Signed)
Pt came in to request a medication refill on -lisinopril (PRINIVIL,ZESTRIL) 5 MG tablet  -sitaGLIPtin (JANUVIA) 100 MG tablet  -needles To [facility] pharmacy, please follow up

## 2017-10-04 ENCOUNTER — Ambulatory Visit: Payer: Medicaid Other | Attending: Family Medicine | Admitting: Physician Assistant

## 2017-10-04 VITALS — BP 175/83 | HR 77 | Temp 98.2°F | Ht 62.0 in | Wt 165.0 lb

## 2017-10-04 DIAGNOSIS — F209 Schizophrenia, unspecified: Secondary | ICD-10-CM | POA: Insufficient documentation

## 2017-10-04 DIAGNOSIS — R609 Edema, unspecified: Secondary | ICD-10-CM | POA: Diagnosis not present

## 2017-10-04 DIAGNOSIS — Z9119 Patient's noncompliance with other medical treatment and regimen: Secondary | ICD-10-CM | POA: Diagnosis not present

## 2017-10-04 DIAGNOSIS — Z89432 Acquired absence of left foot: Secondary | ICD-10-CM | POA: Diagnosis not present

## 2017-10-04 DIAGNOSIS — Z888 Allergy status to other drugs, medicaments and biological substances status: Secondary | ICD-10-CM | POA: Insufficient documentation

## 2017-10-04 DIAGNOSIS — D500 Iron deficiency anemia secondary to blood loss (chronic): Secondary | ICD-10-CM | POA: Diagnosis not present

## 2017-10-04 DIAGNOSIS — Z79899 Other long term (current) drug therapy: Secondary | ICD-10-CM | POA: Diagnosis not present

## 2017-10-04 DIAGNOSIS — M7989 Other specified soft tissue disorders: Secondary | ICD-10-CM | POA: Diagnosis present

## 2017-10-04 DIAGNOSIS — I10 Essential (primary) hypertension: Secondary | ICD-10-CM

## 2017-10-04 DIAGNOSIS — IMO0002 Reserved for concepts with insufficient information to code with codable children: Secondary | ICD-10-CM

## 2017-10-04 DIAGNOSIS — S98131A Complete traumatic amputation of one right lesser toe, initial encounter: Secondary | ICD-10-CM

## 2017-10-04 DIAGNOSIS — Z794 Long term (current) use of insulin: Secondary | ICD-10-CM | POA: Diagnosis not present

## 2017-10-04 DIAGNOSIS — R809 Proteinuria, unspecified: Secondary | ICD-10-CM

## 2017-10-04 DIAGNOSIS — E1165 Type 2 diabetes mellitus with hyperglycemia: Secondary | ICD-10-CM | POA: Insufficient documentation

## 2017-10-04 DIAGNOSIS — E1151 Type 2 diabetes mellitus with diabetic peripheral angiopathy without gangrene: Secondary | ICD-10-CM

## 2017-10-04 DIAGNOSIS — Z91199 Patient's noncompliance with other medical treatment and regimen due to unspecified reason: Secondary | ICD-10-CM

## 2017-10-04 DIAGNOSIS — Z89421 Acquired absence of other right toe(s): Secondary | ICD-10-CM | POA: Diagnosis not present

## 2017-10-04 LAB — GLUCOSE, POCT (MANUAL RESULT ENTRY)
POC GLUCOSE: 374 mg/dL — AB (ref 70–99)
POC Glucose: 359 mg/dl — AB (ref 70–99)

## 2017-10-04 MED ORDER — INSULIN ASPART 100 UNIT/ML ~~LOC~~ SOLN
25.0000 [IU] | Freq: Once | SUBCUTANEOUS | Status: AC
  Administered 2017-10-04: 25 [IU] via SUBCUTANEOUS

## 2017-10-04 MED ORDER — FUROSEMIDE 20 MG PO TABS: ORAL_TABLET | ORAL | 0 refills | Status: DC

## 2017-10-04 MED ORDER — GLUCOSE BLOOD VI STRP: ORAL_STRIP | 12 refills | Status: DC

## 2017-10-04 MED ORDER — ACCU-CHEK AVIVA PLUS W/DEVICE KIT: PACK | 0 refills | Status: DC

## 2017-10-04 MED ORDER — INSULIN GLARGINE 100 UNIT/ML SOLOSTAR PEN: PEN_INJECTOR | SUBCUTANEOUS | 5 refills | Status: DC

## 2017-10-04 MED ORDER — LISINOPRIL 5 MG PO TABS: 5.0000 mg | ORAL_TABLET | Freq: Every day | ORAL | 0 refills | Status: DC

## 2017-10-04 MED ORDER — ACCU-CHEK SOFT TOUCH LANCETS MISC: 12 refills | Status: DC

## 2017-10-04 MED ORDER — FERROUS SULFATE 325 (65 FE) MG PO TABS: 325.0000 mg | ORAL_TABLET | Freq: Two times a day (BID) | ORAL | 0 refills | Status: DC

## 2017-10-04 MED ORDER — SITAGLIPTIN PHOSPHATE 100 MG PO TABS: 100.0000 mg | ORAL_TABLET | Freq: Every day | ORAL | 4 refills | Status: DC

## 2017-10-04 MED FILL — FUROSEMIDE 20 MG TABLET: 20 | 7 days supply | Qty: 7 | Fill #0

## 2017-10-04 NOTE — Patient Instructions (Signed)
Check blood sugars fasting and at bedtime and record and bring to next visit.  Check blood pressures 3 times weekly out of the office and record and bring to next visit   Edema Edema is when you have too much fluid in your body or under your skin. Edema may make your legs, feet, and ankles swell up. Swelling is also common in looser tissues, like around your eyes. This is a common condition. It gets more common as you get older. There are many possible causes of edema. Eating too much salt (sodium) and being on your feet or sitting for a long time can cause edema in your legs, feet, and ankles. Hot weather may make edema worse. Edema is usually painless. Your skin may look swollen or shiny. Follow these instructions at home:  Keep the swollen body part raised (elevated) above the level of your heart when you are sitting or lying down.  Do not sit still or stand for a long time.  Do not wear tight clothes. Do not wear garters on your upper legs.  Exercise your legs. This can help the swelling go down.  Wear elastic bandages or support stockings as told by your doctor.  Eat a low-salt (low-sodium) diet to reduce fluid as told by your doctor.  Depending on the cause of your swelling, you may need to limit how much fluid you drink (fluid restriction).  Take over-the-counter and prescription medicines only as told by your doctor. Contact a doctor if:  Treatment is not working.  You have heart, liver, or kidney disease and have symptoms of edema.  You have sudden and unexplained weight gain. Get help right away if:  You have shortness of breath or chest pain.  You cannot breathe when you lie down.  You have pain, redness, or warmth in the swollen areas.  You have heart, liver, or kidney disease and get edema all of a sudden.  You have a fever and your symptoms get worse all of a sudden. Summary  Edema is when you have too much fluid in your body or under your skin.  Edema may  make your legs, feet, and ankles swell up. Swelling is also common in looser tissues, like around your eyes.  Raise (elevate) the swollen body part above the level of your heart when you are sitting or lying down.  Follow your doctor's instructions about diet and how much fluid you can drink (fluid restriction). This information is not intended to replace advice given to you by your health care provider. Make sure you discuss any questions you have with your health care provider. Document Released: 08/16/2007 Document Revised: 03/17/2016 Document Reviewed: 03/17/2016 Elsevier Interactive Patient Education  2017 [employer].

## 2017-10-04 NOTE — Progress Notes (Signed)
Gina [name], is a 57 y.o. female  INO:[id number]  [id number]:[id number]  DOB - 1960-07-26  Subjective:  Chief Complaint and HPI: Gina [name] is a 57 y.o. female here today for swelling of B legs for about 3 weeks.  Pretty much sedentary/using a wheelchair other than being up and around briefly at home.  Requesting forms to be filled out for diabetic shoes.  S/P all digits L foot removed and 3rd digit R foot removed.  Out of meds.  Takes insulin "most of the time."   Needs ophthalmology appt.    Many times referrals get made and patient is a no-show.   ROS:   Constitutional:  No f/c, No night sweats, No unexplained weight loss. EENT:  No vision changes, + blurry vision, No hearing changes. No mouth, throat, or ear problems.  Respiratory: No cough, No SOB Cardiac: No CP, no palpitations GI:  No abd pain, No N/V/D. GU: No Urinary s/sx Musculoskeletal: no new complaints Neuro: No headache, no dizziness, no motor weakness.  Skin: No rash Endocrine:  No polydipsia. No polyuria.  Psych: Denies SI/HI  No problems updated.  ALLERGIES: Allergies  Allergen Reactions  . Metformin And Related Other (See Comments)    Upset stomach    PAST MEDICAL HISTORY: Past Medical History:  Diagnosis Date  . Diabetes mellitus   . Hypertension   . Osteomyelitis of right foot ([place]) 02/22/2017  . Schizophrenia ([place])   . Septic arthritis of interphalangeal joint of toe of left foot ([place]) 06/02/2016  . Stress incontinence 04/26/2017    MEDICATIONS AT HOME: Prior to Admission medications   Medication Sig Start Date End Date Taking? Authorizing Provider  acetaminophen (TYLENOL) 325 MG tablet Take 2 tablets (650 mg total) by mouth every 6 (six) hours as needed for mild pain (or Fever >/= 101). 06/07/16  Yes [name], MD  blood glucose meter kit and supplies KIT Dispense based on patient and insurance preference. Use up to four times daily as directed. (FOR ICD-9 250.00, 250.01). 02/26/17  Yes  Hall, Carole N, DO  Blood Glucose Monitoring Suppl (ACCU-CHEK AVIVA PLUS) w/Device KIT Use as directed TID. E11.9 03/27/17  Yes Hairston, Maylon Peppers, FNP  ferrous sulfate 325 (65 FE) MG tablet Take 1 tablet (325 mg total) by mouth 2 (two) times daily with a meal. 10/04/17  Yes McClung, Angela M, PA-C  glucose blood (ACCU-CHEK AVIVA PLUS) test strip Use as instructed 10/04/17  Yes McClung, Angela M, PA-C  Insulin Glargine (LANTUS SOLOSTAR) 100 UNIT/ML Solostar Pen INJECT 30 UNITS INTO THE SKIN DAILY AT 10 PM. 10/04/17  Yes McClung, Angela M, PA-C  Insulin Pen Needle (TRUEPLUS PEN NEEDLES) 32G X 4 MM MISC Use as directed with lantus 10/01/17  Yes Newlin, Charlane Ferretti, MD  Insulin Syringe-Needle U-100 (BD INSULIN SYRINGE ULTRAFINE) 31G X 15/64" 0.5 ML MISC Use as directed 10/01/17  Yes Charlott Rakes, MD  Lancets (ACCU-CHEK SOFT TOUCH) lancets Use as instructed 10/04/17  Yes McClung, Angela M, PA-C  lisinopril (PRINIVIL,ZESTRIL) 5 MG tablet Take 1 tablet (5 mg total) by mouth daily. 10/04/17  Yes Freeman Caldron M, PA-C  polyethylene glycol [facility]) packet Take 17 g by mouth daily as needed for mild constipation. 06/07/16  Yes [name], MD  senna-docusate (SENOKOT-S) 8.6-50 MG tablet Take 2 tablets by mouth daily. 06/07/16  Yes [name], MD  sitaGLIPtin (JANUVIA) 100 MG tablet Take 1 tablet (100 mg total) by mouth daily. 10/04/17  Yes McClung, Dionne Bucy, PA-C  bisacodyl (DULCOLAX) 10  MG suppository Place 1 suppository (10 mg total) rectally daily as needed for moderate constipation. Patient not taking: Reported on 10/04/2017 06/07/16   [name], MD  docusate sodium (COLACE) 100 MG capsule Take 1 capsule (100 mg total) by mouth 2 (two) times daily. Patient not taking: Reported on 10/04/2017 06/07/16   [name], MD  escitalopram (LEXAPRO) 20 MG tablet Take 1 tablet (20 mg total) by mouth daily. Patient not taking: Reported on 10/04/2017 10/01/17   Charlott Rakes, MD  furosemide (LASIX) 20 MG tablet Take  1 daily for swelling 10/04/17   Argentina Donovan, PA-C     Objective:  EXAM:   Vitals:   10/04/17 1631  BP: (!) 175/83  Pulse: 77  Temp: 98.2 F (36.8 C)  TempSrc: Oral  Weight: 165 lb (74.8 kg)  Height: 5' 2"  (1.575 m)    General appearance : A&OX3. NAD. Non-toxic-appearing, in a wheel chair HEENT: Atraumatic and Normocephalic.  PERRLA. EOM intact.   Neck: supple, no JVD. No cervical lymphadenopathy. No thyromegaly Chest/Lungs:  Breathing-non-labored, Good air entry bilaterally, breath sounds normal without rales, rhonchi, or wheezing  CVS: S1 S2 regular, no murmurs, gallops, rubs  Extremities: L foot-healed from s/p all digit amputation about mid-foot.  R foot with 3rd digit amputation.  B feet with edema.  No signs of gangrene/infection today.  Slightly delayed capillary RF. There is pitting edema B with some small weeping ulcerations.  No sign cellulitis.   Neurology:  CN II-XII grossly intact, Non focal.   Psych:  TP linear. J/I WNL. Normal speech. Appropriate eye contact and affect.  Skin:  No Rash  Data Review Lab Results  Component Value Date   HGBA1C 15.4 (H) 02/22/2017   HGBA1C 14.3 (H) 06/01/2016   HGBA1C >15 04/28/2016     Assessment & Plan   1. Edema, unspecified type Very poor health.  Believe compliance is a major issue as blood sugars and A1C are always very high.   - furosemide (LASIX) 20 MG tablet; Take 1 daily for swelling  Dispense: 7 tablet; Refill: 0  2. Type 2 diabetes mellitus with hyperglycemia, with long-term current use of insulin (HCC) Uncontrolled.  Will go ahead and increase lantus from 20 to 30 units daily.  Check labs.  Close follow-up.  Check blood sugars fasting and at bedtime(at least) and record and bring to next visit.   - Glucose (CBG) - Urinalysis Dipstick - insulin aspart (novoLOG) injection 25 Units - Hemoglobin A1c - CBC with Differential/Platelet - Insulin Glargine (LANTUS SOLOSTAR) 100 UNIT/ML Solostar Pen; INJECT 30 UNITS  INTO THE SKIN DAILY AT 10 PM.  Dispense: 15 pen; Refill: 5 - sitaGLIPtin (JANUVIA) 100 MG tablet; Take 1 tablet (100 mg total) by mouth daily.  Dispense: 30 tablet; Refill: 4 - Ambulatory referral to Ophthalmology New glucometer Rx sent  3. S/P transmetatarsal amputation of foot, left ([place]) Rx for podiatry diabetic shoes forms filled out.   4. Toe amputation status, right ([place]) Rx for podiatry diabetic shoes forms filled out.  5. Essential hypertension Uncontrolled.  Resume lisinopril(was out last several days) - Comprehensive metabolic panel - CBC with Differential/Platelet - TSH - lisinopril (PRINIVIL,ZESTRIL) 5 MG tablet; Take 1 tablet (5 mg total) by mouth daily.  Dispense: 30 tablet; Refill: 0 -check BP OOO 3 times/week and record and bring to next visit.    6. DM (diabetes mellitus), type 2, uncontrolled, periph vascular complic (HCC) - Lancets (ACCU-CHEK SOFT TOUCH) lancets; Use as instructed  Dispense: 100  each; Refill: 12 - glucose blood (ACCU-CHEK AVIVA PLUS) test strip; Use as instructed  Dispense: 100 each; Refill: 12  7. Normocytic anemia due to blood loss - ferrous sulfate 325 (65 FE) MG tablet; Take 1 tablet (325 mg total) by mouth 2 (two) times daily with a meal.  Dispense: 60 tablet; Refill: 0  8. Urine test positive for microalbuminuria - lisinopril (PRINIVIL,ZESTRIL) 5 MG tablet; Take 1 tablet (5 mg total) by mouth daily.  Dispense: 30 tablet; Refill: 0  9. Non-compliance Discussed with nursing assistant that is with patient and patient at length about concern for further decompensation if blood sugar does not get better controlled or other conditions worsen.  Concern that she is at high risk for admission.  Discussed precautions, R/B at length.     Patient have been counseled extensively about nutrition and exercise  Return in about 3 weeks (around 10/25/2017) for assign new PCP; f/up DM and htn and edema.  The patient was given clear instructions to go to ER  or return to medical center if symptoms don't improve, worsen or new problems develop. The patient verbalized understanding. The patient was told to call to get lab results if they haven't heard anything in the next week.     Freeman Caldron, PA-C [facility] and [facility], [place]   10/04/2017, 4:53 PM

## 2017-10-05 LAB — CBC WITH DIFFERENTIAL/PLATELET
BASOS ABS: 0 10*3/uL (ref 0.0–0.2)
Basos: 0 %
EOS (ABSOLUTE): 0.1 10*3/uL (ref 0.0–0.4)
Eos: 2 %
Hematocrit: 35.5 % (ref 34.0–46.6)
Hemoglobin: 11.4 g/dL (ref 11.1–15.9)
IMMATURE GRANULOCYTES: 0 %
Immature Grans (Abs): 0 10*3/uL (ref 0.0–0.1)
LYMPHS ABS: 1.6 10*3/uL (ref 0.7–3.1)
Lymphs: 36 %
MCH: 30.2 pg (ref 26.6–33.0)
MCHC: 32.1 g/dL (ref 31.5–35.7)
MCV: 94 fL (ref 79–97)
MONOS ABS: 0.4 10*3/uL (ref 0.1–0.9)
Monocytes: 8 %
NEUTROS PCT: 54 %
Neutrophils Absolute: 2.5 10*3/uL (ref 1.4–7.0)
Platelets: 218 10*3/uL (ref 150–450)
RBC: 3.78 x10E6/uL (ref 3.77–5.28)
RDW: 13.1 % (ref 12.3–15.4)
WBC: 4.6 10*3/uL (ref 3.4–10.8)

## 2017-10-05 LAB — HEMOGLOBIN A1C
Est. average glucose Bld gHb Est-mCnc: 324 mg/dL
HEMOGLOBIN A1C: 12.9 % — AB (ref 4.8–5.6)

## 2017-10-05 LAB — TSH: TSH: 0.651 u[IU]/mL (ref 0.450–4.500)

## 2017-10-05 LAB — COMPREHENSIVE METABOLIC PANEL
A/G RATIO: 1.1 — AB (ref 1.2–2.2)
ALK PHOS: 116 IU/L (ref 39–117)
ALT: 31 IU/L (ref 0–32)
AST: 28 IU/L (ref 0–40)
Albumin: 3.7 g/dL (ref 3.5–5.5)
BUN/Creatinine Ratio: 16 (ref 9–23)
BUN: 11 mg/dL (ref 6–24)
CHLORIDE: 101 mmol/L (ref 96–106)
CO2: 25 mmol/L (ref 20–29)
Calcium: 9 mg/dL (ref 8.7–10.2)
Creatinine, Ser: 0.67 mg/dL (ref 0.57–1.00)
GFR calc Af Amer: 114 mL/min/{1.73_m2} (ref >59)
GFR calc non Af Amer: 99 mL/min/{1.73_m2} (ref >59)
GLOBULIN, TOTAL: 3.4 g/dL (ref 1.5–4.5)
Glucose: 411 mg/dL — ABNORMAL HIGH (ref 65–99)
POTASSIUM: 4.3 mmol/L (ref 3.5–5.2)
SODIUM: 139 mmol/L (ref 134–144)
Total Protein: 7.1 g/dL (ref 6.0–8.5)

## 2017-10-08 ENCOUNTER — Telehealth: Payer: Self-pay | Admitting: Family Medicine

## 2017-10-08 NOTE — Telephone Encounter (Signed)
Pt's case manager Delana Meyer called to request a confirmation on a foot exam done on 10/04/2017  please follow up  [phone]

## 2017-10-09 ENCOUNTER — Telehealth: Payer: Self-pay | Admitting: *Deleted

## 2017-10-09 ENCOUNTER — Other Ambulatory Visit: Payer: Self-pay | Admitting: Physician Assistant

## 2017-10-09 NOTE — Telephone Encounter (Signed)
MA unable to reach patient due to no answer and no VM option. !!!Please inform patient of diabetes being uncontrolled and A1C [place] a 12.9. Patient needs to increase Lantus to 30 units daily. Check her blood sugars and bring to next follow up. All other labs and tryroid were normal!!!

## 2017-10-09 NOTE — Telephone Encounter (Signed)
MA spoke with [facility] and received the fax number to send over foot exam notes and prescription for diabetic shoes.

## 2017-10-09 NOTE — Telephone Encounter (Signed)
-----   Message from Argentina Donovan, [place] sent at 10/09/2017  7:59 AM EDT ----- Please call patient.  Diabetes is very poorly controlled.  A1C was 12.9.  Continue with the increased dose of Lantus I advised at 30 units daily.  Check sugars as we discussed and follow-up as planned in a few weeks.  Bring blood sugar log with you to your appt.  Other labs including thyroid are WNL. Thanks, Freeman Caldron, PA-C

## 2017-10-29 ENCOUNTER — Telehealth: Payer: Self-pay | Admitting: Family Medicine

## 2017-10-29 NOTE — Telephone Encounter (Signed)
Will route to PCP 

## 2017-10-29 NOTE — Telephone Encounter (Signed)
Patient called requesting adult diapers, please follow up with patient.

## 2017-11-01 ENCOUNTER — Ambulatory Visit: Payer: Medicaid Other | Attending: Family Medicine | Admitting: Physician Assistant

## 2017-11-01 ENCOUNTER — Other Ambulatory Visit: Payer: Self-pay | Admitting: Family Medicine

## 2017-11-01 VITALS — BP 155/74 | HR 68 | Temp 98.2°F | Resp 18 | Ht 62.0 in | Wt 162.0 lb

## 2017-11-01 DIAGNOSIS — Z794 Long term (current) use of insulin: Secondary | ICD-10-CM | POA: Diagnosis not present

## 2017-11-01 DIAGNOSIS — E1165 Type 2 diabetes mellitus with hyperglycemia: Secondary | ICD-10-CM

## 2017-11-01 DIAGNOSIS — IMO0002 Reserved for concepts with insufficient information to code with codable children: Secondary | ICD-10-CM

## 2017-11-01 DIAGNOSIS — Z89432 Acquired absence of left foot: Secondary | ICD-10-CM | POA: Insufficient documentation

## 2017-11-01 DIAGNOSIS — E1151 Type 2 diabetes mellitus with diabetic peripheral angiopathy without gangrene: Secondary | ICD-10-CM

## 2017-11-01 DIAGNOSIS — I10 Essential (primary) hypertension: Secondary | ICD-10-CM | POA: Insufficient documentation

## 2017-11-01 DIAGNOSIS — R609 Edema, unspecified: Secondary | ICD-10-CM | POA: Diagnosis not present

## 2017-11-01 DIAGNOSIS — G629 Polyneuropathy, unspecified: Secondary | ICD-10-CM

## 2017-11-01 DIAGNOSIS — Z89021 Acquired absence of right finger(s): Secondary | ICD-10-CM | POA: Insufficient documentation

## 2017-11-01 DIAGNOSIS — E1140 Type 2 diabetes mellitus with diabetic neuropathy, unspecified: Secondary | ICD-10-CM | POA: Diagnosis not present

## 2017-11-01 DIAGNOSIS — L03115 Cellulitis of right lower limb: Secondary | ICD-10-CM

## 2017-11-01 DIAGNOSIS — Z79899 Other long term (current) drug therapy: Secondary | ICD-10-CM | POA: Diagnosis not present

## 2017-11-01 LAB — GLUCOSE, POCT (MANUAL RESULT ENTRY): POC GLUCOSE: 244 mg/dL — AB (ref 70–99)

## 2017-11-01 MED ORDER — DOXYCYCLINE HYCLATE 100 MG PO TABS: 100.0000 mg | ORAL_TABLET | Freq: Two times a day (BID) | ORAL | 0 refills | Status: DC

## 2017-11-01 MED ORDER — GABAPENTIN 300 MG PO CAPS: 300.0000 mg | ORAL_CAPSULE | Freq: Every day | ORAL | 3 refills | Status: DC

## 2017-11-01 MED ORDER — FUROSEMIDE 20 MG PO TABS: ORAL_TABLET | ORAL | 0 refills | Status: DC

## 2017-11-01 NOTE — Patient Instructions (Signed)
Make appointment with Dr Sharol Given

## 2017-11-01 NOTE — Progress Notes (Signed)
Gina [name], is a 57 y.o. female  HFW:[id number]  [id number]  DOB - 1961/01/27  Subjective:  Chief Complaint and HPI: Gina [name] is a 57 y.o. female here today with increased pain in R ankle and foot.  No fever or chills.  She says it is starting to feel like it did when it was infected. L foot amputation 05/2016.  R digit amputation 02/2017.  She says the R one doesn't seem like its ever healed correctly.  Legs are swelling again.  She has not been elevating her legs.  Blood sugars have bee "better."  See last note.    Also c/o neuropathy Bo legs/feet.   ROS:   Constitutional:  No f/c, No night sweats, No unexplained weight loss. EENT:  No vision changes, No blurry vision, No hearing changes. No mouth, throat, or ear problems.  Respiratory: No cough, No SOB Cardiac: No CP, no palpitations GI:  No abd pain, No N/V/D. GU: No Urinary s/sx Musculoskeletal: R foot pain Neuro: No headache, no dizziness, no motor weakness.  Skin: No rash Endocrine:  No polydipsia. No polyuria.  Psych: Denies SI/HI  No problems updated.  ALLERGIES: Allergies  Allergen Reactions  . Metformin And Related Other (See Comments)    Upset stomach    PAST MEDICAL HISTORY: Past Medical History:  Diagnosis Date  . Diabetes mellitus   . Hypertension   . Osteomyelitis of right foot ([place]) 02/22/2017  . Schizophrenia ([place])   . Septic arthritis of interphalangeal joint of toe of left foot ([place]) 06/02/2016  . Stress incontinence 04/26/2017    MEDICATIONS AT HOME: Prior to Admission medications   Medication Sig Start Date End Date Taking? Authorizing Provider  acetaminophen (TYLENOL) 325 MG tablet Take 2 tablets (650 mg total) by mouth every 6 (six) hours as needed for mild pain (or Fever >/= 101). 06/07/16  Yes [name], MD  blood glucose meter kit and supplies KIT Dispense based on patient and insurance preference. Use up to four times daily as directed. (FOR ICD-9 250.00, 250.01).  02/26/17  Yes Hall, Carole N, DO  Blood Glucose Monitoring Suppl (ACCU-CHEK AVIVA PLUS) w/Device KIT Use as directed TID. E11.9 10/04/17  Yes McClung, Angela M, PA-C  escitalopram (LEXAPRO) 20 MG tablet Take 1 tablet (20 mg total) by mouth daily. 10/01/17  Yes Charlott Rakes, MD  ferrous sulfate 325 (65 FE) MG tablet Take 1 tablet (325 mg total) by mouth 2 (two) times daily with a meal. 10/04/17  Yes McClung, Angela M, PA-C  furosemide (LASIX) 20 MG tablet Take 1 daily for swelling 11/01/17  Yes Freeman Caldron M, PA-C  glucose blood (ACCU-CHEK AVIVA PLUS) test strip Use as instructed 10/04/17  Yes McClung, Angela M, PA-C  Insulin Glargine (LANTUS SOLOSTAR) 100 UNIT/ML Solostar Pen INJECT 30 UNITS INTO THE SKIN DAILY AT 10 PM. 10/04/17  Yes McClung, Angela M, PA-C  Insulin Pen Needle (TRUEPLUS PEN NEEDLES) 32G X 4 MM MISC Use as directed with lantus 10/01/17  Yes Newlin, Charlane Ferretti, MD  Insulin Syringe-Needle U-100 (BD INSULIN SYRINGE ULTRAFINE) 31G X 15/64" 0.5 ML MISC Use as directed 10/01/17  Yes Charlott Rakes, MD  Lancets (ACCU-CHEK SOFT TOUCH) lancets Use as instructed 10/04/17  Yes McClung, Angela M, PA-C  lisinopril (PRINIVIL,ZESTRIL) 5 MG tablet Take 1 tablet (5 mg total) by mouth daily. 10/04/17  Yes Freeman Caldron M, PA-C  polyethylene glycol [facility]) packet Take 17 g by mouth daily as needed for mild constipation. 06/07/16  Yes [name], MD  senna-docusate (SENOKOT-S) 8.6-50 MG tablet Take 2 tablets by mouth daily. 06/07/16  Yes [name], MD  sitaGLIPtin (JANUVIA) 100 MG tablet Take 1 tablet (100 mg total) by mouth daily. 10/04/17  Yes McClung, Angela M, PA-C  bisacodyl (DULCOLAX) 10 MG suppository Place 1 suppository (10 mg total) rectally daily as needed for moderate constipation. Patient not taking: Reported on 10/04/2017 06/07/16   [name], MD  docusate sodium (COLACE) 100 MG capsule Take 1 capsule (100 mg total) by mouth 2 (two) times daily. Patient not taking: Reported on  10/04/2017 06/07/16   [name], MD  doxycycline (VIBRA-TABS) 100 MG tablet Take 1 tablet (100 mg total) by mouth 2 (two) times daily. 11/01/17   Argentina Donovan, PA-C  gabapentin (NEURONTIN) 300 MG capsule Take 1 capsule (300 mg total) by mouth at bedtime. 11/01/17   Argentina Donovan, PA-C     Objective:  EXAM:   Vitals:   11/01/17 1604  BP: (!) 155/74  Pulse: 68  Resp: 18  Temp: 98.2 F (36.8 C)  TempSrc: Oral  SpO2: 94%  Weight: 162 lb (73.5 kg)  Height: _0  (1.575 m)    General appearance : A&OX3. NAD. Non-toxic-appearing HEENT: Atraumatic and Normocephalic.  PERRLA. EOM intact.   Neck: supple, no JVD. No cervical lymphadenopathy. No thyromegaly Chest/Lungs:  Breathing-non-labored, Good air entry bilaterally, breath sounds normal without rales, rhonchi, or wheezing  CVS: S1 S2 regular, no murmurs, gallops, rubs  Extremities: Bilateral Lower Ext shows <1+ edema, both legs are warm to touch.  R DP intact.  Medial aspect of ankle with mild erythema and mild swelling and warm to touch, mild TTP.  Closed sores on lower leg.  ROM ankle ~50% of normal.   Neurology:  CN II-XII grossly intact, Non focal.   Psych:  TP linear. J/I WNL. Normal speech. Appropriate eye contact and affect.  Skin:  No Rash  Data Review Lab Results  Component Value Date   HGBA1C 12.9 (H) 10/04/2017   HGBA1C 15.4 (H) 02/22/2017   HGBA1C 14.3 (H) 06/01/2016     Assessment & Plan   1. Cellulitis of right foot Early-patient does not appear septic/doubt osteomyelitis.  She should follow up with Dr Sharol Given since she hasn't seen him in a long time and feels this foot has healed poorly.   - doxycycline (VIBRA-TABS) 100 MG tablet; Take 1 tablet (100 mg total) by mouth 2 (two) times daily.  Dispense: 20 tablet; Refill: 0 - CBC with Differential/Platelet  Her legs overall actually look some better than at her last visit.    2. DM (diabetes mellitus), type 2, uncontrolled, periph vascular complic  ([place]) Improving.  Continue current regimen.  Compliance again stressed.   - Glucose (CBG)  3. Edema, unspecified type Elevate daily! - furosemide (LASIX) 20 MG tablet; Take 1 daily for swelling  Dispense: 7 tablet; Refill: 0  4. Neuropathy Start gabapentin; may need to titrate and increase dose as needed/appropriate.  - gabapentin (NEURONTIN) 300 MG capsule; Take 1 capsule (300 mg total) by mouth at bedtime.  Dispense: 90 capsule; Refill: 3   Patient have been counseled extensively about nutrition and exercise  Return for keep 8/27 appt with Zelda.  The patient was given clear instructions to go to ER or return to medical center if symptoms don't improve, worsen or new problems develop. The patient verbalized understanding. The patient was told to call to get lab results if they haven't heard anything in the next week.  Freeman Caldron, PA-C [facility] and [facility], [place]   11/01/2017, 4:28 PM

## 2017-11-02 LAB — CBC WITH DIFFERENTIAL/PLATELET
Basophils Absolute: 0 10*3/uL (ref 0.0–0.2)
Basos: 0 %
EOS (ABSOLUTE): 0.1 10*3/uL (ref 0.0–0.4)
Eos: 1 %
Hematocrit: 31.3 % — ABNORMAL LOW (ref 34.0–46.6)
Hemoglobin: 10.2 g/dL — ABNORMAL LOW (ref 11.1–15.9)
Immature Grans (Abs): 0 10*3/uL (ref 0.0–0.1)
Immature Granulocytes: 0 %
Lymphocytes Absolute: 1.6 10*3/uL (ref 0.7–3.1)
Lymphs: 13 %
MCH: 29.4 pg (ref 26.6–33.0)
MCHC: 32.6 g/dL (ref 31.5–35.7)
MCV: 90 fL (ref 79–97)
Monocytes Absolute: 1.7 10*3/uL — ABNORMAL HIGH (ref 0.1–0.9)
Monocytes: 14 %
Neutrophils Absolute: 8.7 10*3/uL — ABNORMAL HIGH (ref 1.4–7.0)
Neutrophils: 72 %
Platelets: 257 10*3/uL (ref 150–450)
RBC: 3.47 x10E6/uL — ABNORMAL LOW (ref 3.77–5.28)
RDW: 13.2 % (ref 12.3–15.4)
WBC: 12 10*3/uL — ABNORMAL HIGH (ref 3.4–10.8)

## 2017-11-05 ENCOUNTER — Telehealth: Payer: Self-pay | Admitting: *Deleted

## 2017-11-05 NOTE — Telephone Encounter (Signed)
MA unable to reach the patient due to no answer and no VM option. !!!Please inform patient of blood count being elevated which is consistent with the infection. Patient should take medication as prescribed and discussed. Patient should repott to the ED if fever or pain worsens. Patient has FU with PCP on 11/06/17 at 4:10pm!!!

## 2017-11-05 NOTE — Telephone Encounter (Signed)
-----   Message from Argentina Donovan, [place] sent at 11/02/2017  5:09 PM EDT ----- Please call patient.  Her blood count was elevated which would be consistent with infection.  Take antibiotics as we discussed.  If she develops fever or worsening pain, go to the ED as we discussed.  Thanks, Freeman Caldron, PA-C

## 2017-11-06 ENCOUNTER — Other Ambulatory Visit: Payer: Self-pay

## 2017-11-06 ENCOUNTER — Inpatient Hospital Stay (HOSPITAL_COMMUNITY)
Admission: EM | Admit: 2017-11-06 | Discharge: 2017-11-16 | DRG: 854 | Disposition: A | Discharge status: Skilled Nursing Facility | Payer: Medicaid Other | Attending: Internal Medicine | Admitting: Internal Medicine

## 2017-11-06 ENCOUNTER — Inpatient Hospital Stay (HOSPITAL_COMMUNITY): Payer: Medicaid Other

## 2017-11-06 ENCOUNTER — Ambulatory Visit: Payer: Medicaid Other | Admitting: Nurse Practitioner

## 2017-11-06 ENCOUNTER — Encounter (HOSPITAL_COMMUNITY): Payer: Self-pay

## 2017-11-06 ENCOUNTER — Encounter (HOSPITAL_COMMUNITY): Payer: Medicaid Other

## 2017-11-06 ENCOUNTER — Emergency Department (HOSPITAL_COMMUNITY): Payer: Medicaid Other

## 2017-11-06 ENCOUNTER — Inpatient Hospital Stay (HOSPITAL_COMMUNITY): Admit: 2017-11-06 | Payer: Medicaid Other

## 2017-11-06 DIAGNOSIS — Z888 Allergy status to other drugs, medicaments and biological substances status: Secondary | ICD-10-CM | POA: Diagnosis not present

## 2017-11-06 DIAGNOSIS — A419 Sepsis, unspecified organism: Principal | ICD-10-CM | POA: Diagnosis present

## 2017-11-06 DIAGNOSIS — E860 Dehydration: Secondary | ICD-10-CM | POA: Diagnosis present

## 2017-11-06 DIAGNOSIS — E1169 Type 2 diabetes mellitus with other specified complication: Secondary | ICD-10-CM | POA: Diagnosis present

## 2017-11-06 DIAGNOSIS — E1151 Type 2 diabetes mellitus with diabetic peripheral angiopathy without gangrene: Secondary | ICD-10-CM

## 2017-11-06 DIAGNOSIS — E440 Moderate protein-calorie malnutrition: Secondary | ICD-10-CM

## 2017-11-06 DIAGNOSIS — D638 Anemia in other chronic diseases classified elsewhere: Secondary | ICD-10-CM | POA: Diagnosis not present

## 2017-11-06 DIAGNOSIS — L03115 Cellulitis of right lower limb: Secondary | ICD-10-CM | POA: Diagnosis present

## 2017-11-06 DIAGNOSIS — Z89511 Acquired absence of right leg below knee: Secondary | ICD-10-CM

## 2017-11-06 DIAGNOSIS — E11621 Type 2 diabetes mellitus with foot ulcer: Secondary | ICD-10-CM | POA: Diagnosis present

## 2017-11-06 DIAGNOSIS — F209 Schizophrenia, unspecified: Secondary | ICD-10-CM | POA: Diagnosis present

## 2017-11-06 DIAGNOSIS — M868X7 Other osteomyelitis, ankle and foot: Secondary | ICD-10-CM | POA: Diagnosis present

## 2017-11-06 DIAGNOSIS — E11649 Type 2 diabetes mellitus with hypoglycemia without coma: Secondary | ICD-10-CM | POA: Diagnosis not present

## 2017-11-06 DIAGNOSIS — L0390 Cellulitis, unspecified: Secondary | ICD-10-CM | POA: Diagnosis not present

## 2017-11-06 DIAGNOSIS — E43 Unspecified severe protein-calorie malnutrition: Secondary | ICD-10-CM

## 2017-11-06 DIAGNOSIS — L03119 Cellulitis of unspecified part of limb: Secondary | ICD-10-CM

## 2017-11-06 DIAGNOSIS — E1100 Type 2 diabetes mellitus with hyperosmolarity without nonketotic hyperglycemic-hyperosmolar coma (NKHHC): Secondary | ICD-10-CM | POA: Diagnosis present

## 2017-11-06 DIAGNOSIS — Z794 Long term (current) use of insulin: Secondary | ICD-10-CM

## 2017-11-06 DIAGNOSIS — Z833 Family history of diabetes mellitus: Secondary | ICD-10-CM

## 2017-11-06 DIAGNOSIS — Z89421 Acquired absence of other right toe(s): Secondary | ICD-10-CM | POA: Diagnosis not present

## 2017-11-06 DIAGNOSIS — I10 Essential (primary) hypertension: Secondary | ICD-10-CM | POA: Diagnosis present

## 2017-11-06 DIAGNOSIS — Z8249 Family history of ischemic heart disease and other diseases of the circulatory system: Secondary | ICD-10-CM

## 2017-11-06 DIAGNOSIS — E871 Hypo-osmolality and hyponatremia: Secondary | ICD-10-CM | POA: Diagnosis present

## 2017-11-06 DIAGNOSIS — Z6829 Body mass index (BMI) 29.0-29.9, adult: Secondary | ICD-10-CM

## 2017-11-06 DIAGNOSIS — Z8042 Family history of malignant neoplasm of prostate: Secondary | ICD-10-CM | POA: Diagnosis not present

## 2017-11-06 DIAGNOSIS — Z89432 Acquired absence of left foot: Secondary | ICD-10-CM

## 2017-11-06 DIAGNOSIS — Z79899 Other long term (current) drug therapy: Secondary | ICD-10-CM

## 2017-11-06 DIAGNOSIS — L02611 Cutaneous abscess of right foot: Secondary | ICD-10-CM | POA: Diagnosis present

## 2017-11-06 DIAGNOSIS — E1142 Type 2 diabetes mellitus with diabetic polyneuropathy: Secondary | ICD-10-CM | POA: Diagnosis present

## 2017-11-06 DIAGNOSIS — L02619 Cutaneous abscess of unspecified foot: Secondary | ICD-10-CM | POA: Diagnosis not present

## 2017-11-06 DIAGNOSIS — E1165 Type 2 diabetes mellitus with hyperglycemia: Secondary | ICD-10-CM | POA: Diagnosis present

## 2017-11-06 DIAGNOSIS — L97419 Non-pressure chronic ulcer of right heel and midfoot with unspecified severity: Secondary | ICD-10-CM | POA: Diagnosis present

## 2017-11-06 DIAGNOSIS — R739 Hyperglycemia, unspecified: Secondary | ICD-10-CM

## 2017-11-06 LAB — CBC WITH DIFFERENTIAL/PLATELET
BASOS PCT: 0 %
Basophils Absolute: 0 10*3/uL (ref 0.0–0.1)
EOS PCT: 0 %
Eosinophils Absolute: 0 10*3/uL (ref 0.0–0.7)
HEMATOCRIT: 30.4 % — AB (ref 36.0–46.0)
Hemoglobin: 10 g/dL — ABNORMAL LOW (ref 12.0–15.0)
LYMPHS ABS: 2 10*3/uL (ref 0.7–4.0)
Lymphocytes Relative: 10 %
MCH: 29.9 pg (ref 26.0–34.0)
MCHC: 32.9 g/dL (ref 30.0–36.0)
MCV: 90.7 fL (ref 78.0–100.0)
MONOS PCT: 10 %
Monocytes Absolute: 2 10*3/uL — ABNORMAL HIGH (ref 0.1–1.0)
NEUTROS ABS: 16.4 10*3/uL — AB (ref 1.7–7.7)
Neutrophils Relative %: 80 %
Platelets: 338 10*3/uL (ref 150–400)
RBC: 3.35 MIL/uL — ABNORMAL LOW (ref 3.87–5.11)
RDW: 12.9 % (ref 11.5–15.5)
WBC: 20.4 10*3/uL — ABNORMAL HIGH (ref 4.0–10.5)

## 2017-11-06 LAB — COMPREHENSIVE METABOLIC PANEL
ALBUMIN: 2.7 g/dL — AB (ref 3.5–5.0)
ALT: 16 U/L (ref 0–44)
ANION GAP: 10 (ref 5–15)
AST: 17 U/L (ref 15–41)
Alkaline Phosphatase: 84 U/L (ref 38–126)
BILIRUBIN TOTAL: 0.4 mg/dL (ref 0.3–1.2)
BUN: 12 mg/dL (ref 6–20)
CO2: 28 mmol/L (ref 22–32)
Calcium: 8.9 mg/dL (ref 8.9–10.3)
Chloride: 96 mmol/L — ABNORMAL LOW (ref 98–111)
Creatinine, Ser: 0.63 mg/dL (ref 0.44–1.00)
GFR calc Af Amer: >60 mL/min (ref >60)
GFR calc non Af Amer: >60 mL/min (ref >60)
Glucose, Bld: 464 mg/dL — ABNORMAL HIGH (ref 70–99)
POTASSIUM: 4.3 mmol/L (ref 3.5–5.1)
Sodium: 134 mmol/L — ABNORMAL LOW (ref 135–145)
Total Protein: 7.4 g/dL (ref 6.5–8.1)

## 2017-11-06 LAB — CBG MONITORING, ED: GLUCOSE-CAPILLARY: 450 mg/dL — AB (ref 70–99)

## 2017-11-06 LAB — GLUCOSE, CAPILLARY
GLUCOSE-CAPILLARY: 171 mg/dL — AB (ref 70–99)
Glucose-Capillary: 262 mg/dL — ABNORMAL HIGH (ref 70–99)

## 2017-11-06 LAB — I-STAT CG4 LACTIC ACID, ED: Lactic Acid, Venous: 1.2 mmol/L (ref 0.5–1.9)

## 2017-11-06 MED ORDER — SODIUM CHLORIDE 0.9 % IV BOLUS
1000.0000 mL | Freq: Once | INTRAVENOUS | Status: AC
  Administered 2017-11-06: 1000 mL via INTRAVENOUS

## 2017-11-06 MED ORDER — INSULIN ASPART 100 UNIT/ML ~~LOC~~ SOLN
0.0000 [IU] | Freq: Every day | SUBCUTANEOUS | Status: DC
  Administered 2017-11-06: 3 [IU] via SUBCUTANEOUS
  Administered 2017-11-07: 2 [IU] via SUBCUTANEOUS

## 2017-11-06 MED ORDER — FERROUS SULFATE 325 (65 FE) MG PO TABS
325.0000 mg | ORAL_TABLET | Freq: Two times a day (BID) | ORAL | Status: DC
  Administered 2017-11-06 – 2017-11-16 (×17): 325 mg via ORAL
  Filled 2017-11-06 (×18): qty 1

## 2017-11-06 MED ORDER — SODIUM CHLORIDE 0.9 % IV SOLN
250.0000 mL | INTRAVENOUS | Status: DC | PRN
  Administered 2017-11-06: 1000 mL via INTRAVENOUS

## 2017-11-06 MED ORDER — ACETAMINOPHEN 650 MG RE SUPP: 650.0000 mg | Freq: Four times a day (QID) | RECTAL | Status: DC | PRN

## 2017-11-06 MED ORDER — GABAPENTIN 300 MG PO CAPS
300.0000 mg | ORAL_CAPSULE | Freq: Every day | ORAL | Status: DC
  Administered 2017-11-06 – 2017-11-14 (×6): 300 mg via ORAL
  Filled 2017-11-06 (×10): qty 1

## 2017-11-06 MED ORDER — ONDANSETRON HCL 4 MG PO TABS: 4.0000 mg | ORAL_TABLET | Freq: Four times a day (QID) | ORAL | Status: DC | PRN

## 2017-11-06 MED ORDER — INSULIN ASPART 100 UNIT/ML ~~LOC~~ SOLN
0.0000 [IU] | Freq: Three times a day (TID) | SUBCUTANEOUS | Status: DC
  Administered 2017-11-06: 8 [IU] via SUBCUTANEOUS
  Administered 2017-11-10: 5 [IU] via SUBCUTANEOUS

## 2017-11-06 MED ORDER — VANCOMYCIN HCL 10 G IV SOLR
1500.0000 mg | Freq: Once | INTRAVENOUS | Status: AC
  Administered 2017-11-06: 1500 mg via INTRAVENOUS
  Filled 2017-11-06: qty 1500

## 2017-11-06 MED ORDER — ENOXAPARIN SODIUM 40 MG/0.4ML ~~LOC~~ SOLN
40.0000 mg | SUBCUTANEOUS | Status: DC
  Administered 2017-11-06 – 2017-11-15 (×9): 40 mg via SUBCUTANEOUS
  Filled 2017-11-06 (×10): qty 0.4

## 2017-11-06 MED ORDER — INSULIN GLARGINE 100 UNIT/ML ~~LOC~~ SOLN
25.0000 [IU] | Freq: Every day | SUBCUTANEOUS | Status: DC
  Administered 2017-11-06 – 2017-11-07 (×2): 25 [IU] via SUBCUTANEOUS
  Filled 2017-11-06 (×3): qty 0.25

## 2017-11-06 MED ORDER — LINAGLIPTIN 5 MG PO TABS
5.0000 mg | ORAL_TABLET | Freq: Every day | ORAL | Status: DC
  Administered 2017-11-06 – 2017-11-08 (×3): 5 mg via ORAL
  Filled 2017-11-06 (×4): qty 1

## 2017-11-06 MED ORDER — VANCOMYCIN HCL 10 G IV SOLR
1250.0000 mg | INTRAVENOUS | Status: DC
  Administered 2017-11-07 – 2017-11-10 (×4): 1250 mg via INTRAVENOUS
  Filled 2017-11-06 (×4): qty 1250

## 2017-11-06 MED ORDER — ESCITALOPRAM OXALATE 10 MG PO TABS
20.0000 mg | ORAL_TABLET | Freq: Every day | ORAL | Status: DC
  Administered 2017-11-06 – 2017-11-16 (×9): 20 mg via ORAL
  Filled 2017-11-06 (×10): qty 2

## 2017-11-06 MED ORDER — ONDANSETRON HCL 4 MG/2ML IJ SOLN
4.0000 mg | Freq: Four times a day (QID) | INTRAMUSCULAR | Status: DC | PRN
  Administered 2017-11-09 – 2017-11-10 (×3): 4 mg via INTRAVENOUS
  Filled 2017-11-06: qty 2

## 2017-11-06 MED ORDER — MORPHINE SULFATE (PF) 2 MG/ML IV SOLN: 2.0000 mg | INTRAVENOUS | Status: DC | PRN

## 2017-11-06 MED ORDER — LISINOPRIL 5 MG PO TABS
5.0000 mg | ORAL_TABLET | Freq: Every day | ORAL | Status: DC
  Administered 2017-11-06 – 2017-11-16 (×9): 5 mg via ORAL
  Filled 2017-11-06 (×10): qty 1

## 2017-11-06 MED ORDER — SODIUM CHLORIDE 0.9% FLUSH: 3.0000 mL | INTRAVENOUS | Status: DC | PRN

## 2017-11-06 MED ORDER — ACETAMINOPHEN 325 MG PO TABS: 650.0000 mg | ORAL_TABLET | Freq: Four times a day (QID) | ORAL | Status: DC | PRN

## 2017-11-06 MED ORDER — SODIUM CHLORIDE 0.9 % IV SOLN
2.0000 g | Freq: Once | INTRAVENOUS | Status: AC
  Administered 2017-11-06: 2 g via INTRAVENOUS
  Filled 2017-11-06: qty 2

## 2017-11-06 MED ORDER — TRAMADOL HCL 50 MG PO TABS
100.0000 mg | ORAL_TABLET | Freq: Four times a day (QID) | ORAL | Status: DC | PRN
  Administered 2017-11-06 – 2017-11-08 (×3): 100 mg via ORAL
  Filled 2017-11-06 (×4): qty 2

## 2017-11-06 MED ORDER — SODIUM CHLORIDE 0.9 % IV SOLN
2.0000 g | Freq: Three times a day (TID) | INTRAVENOUS | Status: DC
  Administered 2017-11-06 – 2017-11-12 (×16): 2 g via INTRAVENOUS
  Filled 2017-11-06 (×20): qty 2

## 2017-11-06 MED ORDER — SODIUM CHLORIDE 0.9% FLUSH
3.0000 mL | Freq: Two times a day (BID) | INTRAVENOUS | Status: DC
  Administered 2017-11-06 – 2017-11-10 (×2): 3 mL via INTRAVENOUS

## 2017-11-06 NOTE — ED Provider Notes (Signed)
[facility] DEPT Provider Note   CSN: [id number] Arrival date & time: 11/06/17  0944     History   Chief Complaint Chief Complaint  Patient presents with  . possible R foot infection    HPI Gina [name] is a 57 y.o. female.  HPI Patient presents with right foot pain.  Previous infection of the foot.  MRI has seen Dr. Sharol Given and had a toe removed previously.  States this morning she got up and stepped on it and ran which she did not remember she is not supposed to do.  Now also has felt bad for the last few days.  Found to have a fever here.  States she had some blood coming out of her heel today.  States the foot is normally not the swollen and red.  Is on doxycycline.   Past Medical History:  Diagnosis Date  . Diabetes mellitus   . Hypertension   . Osteomyelitis of right foot ([place]) 02/22/2017  . Schizophrenia ([place])   . Septic arthritis of interphalangeal joint of toe of left foot ([place]) 06/02/2016  . Stress incontinence 04/26/2017    Patient Active Problem List   Diagnosis Date Noted  . Stress incontinence 05/30/2017  . Toe amputation status, right ([place]) 03/16/2017  . Sepsis ([place]) 02/22/2017  . Schizophrenia ([place])   . S/P transmetatarsal amputation of foot, left ([place]) 06/15/2016  . DM (diabetes mellitus), type 2, uncontrolled, periph vascular complic ([place]) 03/00/9233  . Anemia of chronic disease 06/07/2016  . Protein-calorie malnutrition, severe 06/03/2016  . Osteomyelitis due to type 2 diabetes mellitus ([place]) 06/02/2016  . Colon cancer screening 02/17/2014  . Breast cancer screening 02/17/2014  . Diabetes ([place]) 07/28/2013  . Cholelithiases 07/28/2013  . DKA (diabetic ketoacidoses) ([place]) 06/18/2013  . HTN (hypertension) 06/18/2013  . Cellulitis of female genitalia 08/03/2012  . Hyperglycemia 07/31/2012  . Candidal skin infection 07/31/2012    Past Surgical History:  Procedure Laterality Date  . AMPUTATION Left 06/05/2016   Procedure: LEFT FOOT TRANSMETATARSAL AMPUTATION;  Surgeon: [name], MD;  Location: [facility];  Service: Orthopedics;  Laterality: Left;  . AMPUTATION TOE Right 02/23/2017   Procedure: AMPUTATION RIGHT THIRD TOE;  Surgeon: [name], MD;  Location: [facility];  Service: Orthopedics;  Laterality: Right;  . TUBAL LIGATION       OB History   None      Home Medications    Prior to Admission medications   Medication Sig Start Date End Date Taking? Authorizing Provider  acetaminophen (TYLENOL) 325 MG tablet Take 2 tablets (650 mg total) by mouth every 6 (six) hours as needed for mild pain (or Fever >/= 101). 06/07/16   [name], MD  bisacodyl (DULCOLAX) 10 MG suppository Place 1 suppository (10 mg total) rectally daily as needed for moderate constipation. Patient not taking: Reported on 10/04/2017 06/07/16   [name], MD  blood glucose meter kit and supplies KIT Dispense based on patient and insurance preference. Use up to four times daily as directed. (FOR ICD-9 250.00, 250.01). 02/26/17   Kayleen Memos, DO  Blood Glucose Monitoring Suppl (ACCU-CHEK AVIVA PLUS) w/Device KIT Use as directed TID. E11.9 10/04/17   Argentina Donovan, PA-C  docusate sodium (COLACE) 100 MG capsule Take 1 capsule (100 mg total) by mouth 2 (two) times daily. Patient not taking: Reported on 10/04/2017 06/07/16   [name], MD  doxycycline (VIBRA-TABS) 100 MG tablet Take 1 tablet (100 mg total) by mouth 2 (two)  times daily. 11/01/17   Argentina Donovan, PA-C  escitalopram (LEXAPRO) 20 MG tablet Take 1 tablet (20 mg total) by mouth daily. 10/01/17   Charlott Rakes, MD  ferrous sulfate 325 (65 FE) MG tablet Take 1 tablet (325 mg total) by mouth 2 (two) times daily with a meal. 10/04/17   McClung, Dionne Bucy, PA-C  furosemide (LASIX) 20 MG tablet Take 1 daily for swelling 11/01/17   Freeman Caldron M, PA-C  gabapentin (NEURONTIN) 300 MG capsule Take 1 capsule (300 mg total) by mouth at bedtime. 11/01/17   Argentina Donovan, PA-C  glucose blood (ACCU-CHEK AVIVA PLUS) test strip Use as instructed 10/04/17   Argentina Donovan, PA-C  Insulin Glargine (LANTUS SOLOSTAR) 100 UNIT/ML Solostar Pen INJECT 30 UNITS INTO THE SKIN DAILY AT 10 PM. 10/04/17   Argentina Donovan, PA-C  Insulin Pen Needle (TRUEPLUS PEN NEEDLES) 32G X 4 MM MISC Use as directed with lantus 10/01/17   Charlott Rakes, MD  Insulin Syringe-Needle U-100 (BD INSULIN SYRINGE ULTRAFINE) 31G X 15/64" 0.5 ML MISC Use as directed 10/01/17   Charlott Rakes, MD  Lancets (ACCU-CHEK SOFT TOUCH) lancets Use as instructed 10/04/17   Argentina Donovan, PA-C  lisinopril (PRINIVIL,ZESTRIL) 5 MG tablet Take 1 tablet (5 mg total) by mouth daily. 10/04/17   Argentina Donovan, PA-C  polyethylene glycol [facility]) packet Take 17 g by mouth daily as needed for mild constipation. 06/07/16   [name], MD  senna-docusate (SENOKOT-S) 8.6-50 MG tablet Take 2 tablets by mouth daily. 06/07/16   [name], MD  sitaGLIPtin (JANUVIA) 100 MG tablet Take 1 tablet (100 mg total) by mouth daily. 10/04/17   Argentina Donovan, PA-C    Family History Family History  Problem Relation Age of Onset  . Diabetes type II Father   . CAD Father   . Prostate cancer Father   . Diabetes Mellitus II Brother     Social History Social History   Tobacco Use  . Smoking status: Never Smoker  . Smokeless tobacco: Never Used  Substance Use Topics  . Alcohol use: No    Comment: occasionally  . Drug use: No     Allergies   Metformin and related   Review of Systems Review of Systems  Constitutional: Positive for fever. Negative for appetite change.  Respiratory: Negative for shortness of breath.   Cardiovascular: Negative for chest pain.  Gastrointestinal: Negative for abdominal pain.  Genitourinary: Negative for flank pain.  Musculoskeletal: Negative for back pain and neck pain.  Skin: Negative for rash.  Neurological: Positive for weakness.  Psychiatric/Behavioral: Negative  for confusion.     Physical Exam Updated Vital Signs BP (!) 166/77   Pulse 93   Temp (!) 100.8 F (38.2 C) (Oral)   Resp (!) 21   Ht 5' 2"  (1.575 m)   Wt 73.5 kg   SpO2 97%   BMI 29.63 kg/m   Physical Exam  Constitutional: She appears well-developed.  HENT:  Head: Normocephalic.  Eyes: Pupils are equal, round, and reactive to light.  Neck: Neck supple.  Cardiovascular: Normal rate.  Pulmonary/Chest: Effort normal.  Abdominal: There is no tenderness.  Musculoskeletal: She exhibits tenderness.  Right foot swollen and erythematous.   has medial erythema and new white discoloration.  No air.  Erythema is tracking up the lower leg also.  Neurological: She is alert.  Skin: Skin is warm. Capillary refill takes less than 2 seconds.       ED Treatments /  Results  Labs (all labs ordered are listed, but only abnormal results are displayed) Labs Reviewed  COMPREHENSIVE METABOLIC PANEL - Abnormal; Notable for the following components:      Result Value   Sodium 134 (*)    Chloride 96 (*)    Glucose, Bld 464 (*)    Albumin 2.7 (*)    All other components within normal limits  CBC WITH DIFFERENTIAL/PLATELET - Abnormal; Notable for the following components:   WBC 20.4 (*)    RBC 3.35 (*)    Hemoglobin 10.0 (*)    HCT 30.4 (*)    All other components within normal limits  CBG MONITORING, ED - Abnormal; Notable for the following components:   Glucose-Capillary 450 (*)    All other components within normal limits  CULTURE, BLOOD (ROUTINE X 2)  CULTURE, BLOOD (ROUTINE X 2)  URINALYSIS, ROUTINE W REFLEX MICROSCOPIC  I-STAT CG4 LACTIC ACID, ED    EKG None  Radiology Dg Foot Complete Right  Result Date: 11/06/2017 CLINICAL DATA:  Diabetic patient with a wound on the right heel. EXAM: RIGHT FOOT COMPLETE - 3+ VIEW COMPARISON:  Plain films right foot 02/22/2017. FINDINGS: Since the prior examination, the patient has undergone amputation of the third metatarsal at the mid  diaphysis. No bony destructive change or periosteal reaction is identified. Plantar calcaneal spur is noted. Small linear focus of superficial soft tissue gas is seen in the plantar soft tissues of the foot consistent with an ulceration. Severe and diffuse soft tissue edema is present about the foot. IMPRESSION: Severe, diffuse soft tissue edema about the right foot without plain film evidence of osteomyelitis. Small focus of linear gas in the superficial plantar soft tissues of the foot consistent with skin ulceration. Status post amputation at the level of the mid third metatarsal without complicating feature. Electronically Signed   By: Inge Rise M.D.   On: 11/06/2017 11:24    Procedures Procedures (including critical care time)  Medications Ordered in ED Medications  ceFEPIme (MAXIPIME) 2 g in sodium chloride 0.9 % 100 mL IVPB (has no administration in time range)  vancomycin (VANCOCIN) 1,500 mg in sodium chloride 0.9 % 500 mL IVPB (has no administration in time range)  sodium chloride 0.9 % bolus 1,000 mL (has no administration in time range)     Initial Impression / Assessment and Plan / ED Course  I have reviewed the triage vital signs and the nursing notes.  Pertinent labs & imaging results that were available during my care of the patient were reviewed by me and considered in my medical decision making (see chart for details).     Patient presents with fever and likely cellulitis of right foot.  White count is 20 and has a fever however has a normal lactic acid.  X-ray does not show air.  Discussed with Dr. Sharol Given, who will see the patient in consult.  Antibiotics have been started and will admit to internal medicine.  Dr. Sharol Given states patient will likely need ABI and further vascular studies.  Final Clinical Impressions(s) / ED Diagnoses   Final diagnoses:  Cellulitis of foot  Hyperglycemia    ED Discharge Orders    None       Davonna Belling, MD 11/06/17 1153

## 2017-11-06 NOTE — ED Triage Notes (Signed)
Per Ems: Pt from home c/o of possible infection on R foot.  R pinky toe was amputated in December.  L toes have also been amputated.  Pt hx pf diabetes.  Pt called because she noticed blood from R foot.  Pt c/o of pain when walking.  Drainage is towards heel of R foot.  Pt hx of diabetes.  Pt is not compliant with medications.

## 2017-11-06 NOTE — ED Notes (Signed)
Pt would like to use a Purewick when she needs to void.

## 2017-11-06 NOTE — Progress Notes (Signed)
Pharmacy Antibiotic Note  Gina [name] is a 57 y.o. female admitted on 11/06/2017 with sepsis.  Pharmacy has been consulted for vanc/cefepime dosing. Note patient hx DM s/p previous amputations with cellulitis and abscess of right foot that failed outpatient doxycycline  Plan: 1) Vancomycin 1500mg  IV x 1 then 1250mg  IV q24 - goal AUC 400-500 2) Cefepime 2g IV q8  Height: 5\' 2"  (157.5 cm) Weight: 162 lb (73.5 kg) IBW/kg (Calculated) : 50.1  Temp (24hrs), Avg:100.8 F (38.2 C), Min:100.8 F (38.2 C), Max:100.8 F (38.2 C)  Recent Labs  Lab 11/01/17 1639 11/06/17 1058 11/06/17 1109  WBC 12.0* 20.4*  --   CREATININE  --  0.63  --   LATICACIDVEN  --   --  1.20    Estimated Creatinine Clearance: 73.8 mL/min (by C-G formula based on SCr of 0.63 mg/dL).    Allergies  Allergen Reactions  . Metformin And Related Other (See Comments)    Upset stomach    Thank you for allowing pharmacy to be a part of this patient's care.  Kara Mead 11/06/2017 1:39 PM

## 2017-11-06 NOTE — ED Notes (Signed)
Patient transported to MRI 

## 2017-11-06 NOTE — H&P (Signed)
History and Physical    Gina [name]  RSW:[id number]  DOB: 13-Sep-1960  DOA: 11/06/2017 PCP: Alfonse Spruce, FNP   Patient coming from: home  Chief Complaint: right foot pain and swelling  HPI: Gina [name] is a 57 y.o. female with medical history of DM2, Schizophrenia, HTN, multiple amputations of her feet who presents to the ED for right foot swelling and drainage. When I asked her when the swelling started, she told me that she is so confused that she is not sure but she did step on something today and noted the bleeding. She has been sleeping "a lot" for days. She is noted to have a fever in the ED but she states she has not felt febrile.  She does not have much to add to the history.   ED Course: given Vanc and Cefepime, and a fluid bolus- xray of the foot showing edema and some gas  Review of Systems:  - appetite has been poor for a few days All other systems reviewed and apart from HPI, are negative.  Past Medical History:  Diagnosis Date  . Diabetes mellitus   . Hypertension   . Osteomyelitis of right foot ([place]) 02/22/2017  . Schizophrenia ([place])   . Septic arthritis of interphalangeal joint of toe of left foot ([place]) 06/02/2016  . Stress incontinence 04/26/2017    Past Surgical History:  Procedure Laterality Date  . AMPUTATION Left 06/05/2016   Procedure: LEFT FOOT TRANSMETATARSAL AMPUTATION;  Surgeon: [name], MD;  Location: [facility];  Service: Orthopedics;  Laterality: Left;  . AMPUTATION TOE Right 02/23/2017   Procedure: AMPUTATION RIGHT THIRD TOE;  Surgeon: [name], MD;  Location: [facility];  Service: Orthopedics;  Laterality: Right;  . TUBAL LIGATION      Social History:   reports that she has never smoked. She has never used smokeless tobacco. She reports that she does not drink alcohol or use drugs.  Lives alone.  Allergies  Allergen Reactions  . Metformin And Related Other (See Comments)    Upset stomach    Family History  Problem  Relation Age of Onset  . Diabetes type II Father   . CAD Father   . Prostate cancer Father   . Diabetes Mellitus II Brother      Prior to Admission medications   Medication Sig Start Date End Date Taking? Authorizing Provider  acetaminophen (TYLENOL) 325 MG tablet Take 2 tablets (650 mg total) by mouth every 6 (six) hours as needed for mild pain (or Fever >/= 101). Patient not taking: Reported on 11/06/2017 06/07/16   [name], MD  bisacodyl (DULCOLAX) 10 MG suppository Place 1 suppository (10 mg total) rectally daily as needed for moderate constipation. Patient not taking: Reported on 10/04/2017 06/07/16   [name], MD  blood glucose meter kit and supplies KIT Dispense based on patient and insurance preference. Use up to four times daily as directed. (FOR ICD-9 250.00, 250.01). 02/26/17   Kayleen Memos, DO  Blood Glucose Monitoring Suppl (ACCU-CHEK AVIVA PLUS) w/Device KIT Use as directed TID. E11.9 10/04/17   Argentina Donovan, PA-C  docusate sodium (COLACE) 100 MG capsule Take 1 capsule (100 mg total) by mouth 2 (two) times daily. Patient not taking: Reported on 10/04/2017 06/07/16   [name], MD  doxycycline (VIBRA-TABS) 100 MG tablet Take 1 tablet (100 mg total) by mouth 2 (two) times daily. 11/01/17   Argentina Donovan, PA-C  escitalopram (LEXAPRO) 20 MG tablet Take 1 tablet (  20 mg total) by mouth daily. 10/01/17   Charlott Rakes, MD  ferrous sulfate 325 (65 FE) MG tablet Take 1 tablet (325 mg total) by mouth 2 (two) times daily with a meal. 10/04/17   McClung, Dionne Bucy, PA-C  furosemide (LASIX) 20 MG tablet Take 1 daily for swelling 11/01/17   Freeman Caldron M, PA-C  gabapentin (NEURONTIN) 300 MG capsule Take 1 capsule (300 mg total) by mouth at bedtime. 11/01/17   Argentina Donovan, PA-C  glucose blood (ACCU-CHEK AVIVA PLUS) test strip Use as instructed 10/04/17   Argentina Donovan, PA-C  Insulin Glargine (LANTUS SOLOSTAR) 100 UNIT/ML Solostar Pen INJECT 30 UNITS INTO THE SKIN  DAILY AT 10 PM. 10/04/17   Argentina Donovan, PA-C  Insulin Pen Needle (TRUEPLUS PEN NEEDLES) 32G X 4 MM MISC Use as directed with lantus 10/01/17   Charlott Rakes, MD  Insulin Syringe-Needle U-100 (BD INSULIN SYRINGE ULTRAFINE) 31G X 15/64" 0.5 ML MISC Use as directed 10/01/17   Charlott Rakes, MD  Lancets (ACCU-CHEK SOFT TOUCH) lancets Use as instructed 10/04/17   Argentina Donovan, PA-C  lisinopril (PRINIVIL,ZESTRIL) 5 MG tablet Take 1 tablet (5 mg total) by mouth daily. 10/04/17   Argentina Donovan, PA-C  polyethylene glycol [facility]) packet Take 17 g by mouth daily as needed for mild constipation. 06/07/16   [name], MD  senna-docusate (SENOKOT-S) 8.6-50 MG tablet Take 2 tablets by mouth daily. 06/07/16   [name], MD  sitaGLIPtin (JANUVIA) 100 MG tablet Take 1 tablet (100 mg total) by mouth daily. 10/04/17   Argentina Donovan, PA-C    Physical Exam: Wt Readings from Last 3 Encounters:  11/06/17 73.5 kg  11/01/17 73.5 kg  10/04/17 74.8 kg   Vitals:   11/06/17 1005 11/06/17 1107 11/06/17 1202 11/06/17 1251  BP:  (!) 166/77 (!) 183/87 (!) 173/95  Pulse:  93 88 93  Resp:  (!) 21 (!) 27 18  Temp: (!) 100.8 F (38.2 C)     TempSrc: Oral     SpO2:  97% 98% 97%  Weight:      Height:          Constitutional:  Calm & comfortable Eyes: PERRLA, lids and conjunctivae normal ENT:  Mucous membranes are moist.  Pharynx clear of exudate   Poor dentition.  Neck: Supple, no masses  Respiratory:  Clear to auscultation bilaterally  Normal respiratory effort.  Cardiovascular:  S1 & S2 heard, regular rate and rhythm No Murmurs Abdomen:  Non distended No tenderness, No masses Bowel sounds normal Extremities:  See below- has a left transmetatarsal amputation         Skin:  See above - has abrasion on her legs Neurologic:  AAO x 3 CN 2-12 grossly intact Sensation intact Strength 5/5 in all 4 extremities Psychiatric:  Normal Mood and affect    Labs on Admission:  I have personally reviewed following labs and imaging studies  CBC: Recent Labs  Lab 11/01/17 1639 11/06/17 1058  WBC 12.0* 20.4*  NEUTROABS 8.7* 16.4*  HGB 10.2* 10.0*  HCT 31.3* 30.4*  MCV 90 90.7  PLT 257 893   Basic Metabolic Panel: Recent Labs  Lab 11/06/17 1058  NA 134*  K 4.3  CL 96*  CO2 28  GLUCOSE 464*  BUN 12  CREATININE 0.63  CALCIUM 8.9   GFR: Estimated Creatinine Clearance: 73.8 mL/min (by C-G formula based on SCr of 0.63 mg/dL). Liver Function Tests: Recent Labs  Lab 11/06/17 1058  AST 17  ALT 16  ALKPHOS 84  BILITOT 0.4  PROT 7.4  ALBUMIN 2.7*   No results for input(s): LIPASE, AMYLASE in the last 168 hours. No results for input(s): AMMONIA in the last 168 hours. Coagulation Profile: No results for input(s): INR, PROTIME in the last 168 hours. Cardiac Enzymes: No results for input(s): CKTOTAL, CKMB, CKMBINDEX, TROPONINI in the last 168 hours. BNP (last 3 results) No results for input(s): PROBNP in the last 8760 hours. HbA1C: No results for input(s): HGBA1C in the last 72 hours. CBG: Recent Labs  Lab 11/06/17 1003  GLUCAP 450*   Lipid Profile: No results for input(s): CHOL, HDL, LDLCALC, TRIG, CHOLHDL, LDLDIRECT in the last 72 hours. Thyroid Function Tests: No results for input(s): TSH, T4TOTAL, FREET4, T3FREE, THYROIDAB in the last 72 hours. Anemia Panel: No results for input(s): VITAMINB12, FOLATE, FERRITIN, TIBC, IRON, RETICCTPCT in the last 72 hours. Urine analysis:    Component Value Date/Time   COLORURINE STRAW (A) 02/20/2017 2359   APPEARANCEUR CLEAR 02/20/2017 2359   LABSPEC 1.030 02/20/2017 2359   PHURINE 6.0 02/20/2017 2359   GLUCOSEU >=500 (A) 02/20/2017 2359   HGBUR SMALL (A) 02/20/2017 2359   BILIRUBINUR NEGATIVE 02/20/2017 2359   BILIRUBINUR N 04/28/2016 1603   KETONESUR NEGATIVE 02/20/2017 2359   PROTEINUR NEGATIVE 02/20/2017 2359   UROBILINOGEN 0.2 04/28/2016 1603   UROBILINOGEN 0.2 06/18/2013 0650   NITRITE  NEGATIVE 02/20/2017 2359   LEUKOCYTESUR NEGATIVE 02/20/2017 2359   Sepsis Labs: _0 (procalcitonin:4,lacticidven:4) )No results found for this or any previous visit (from the past 240 hour(s)).   Radiological Exams on Admission: Dg Foot Complete Right  Result Date: 11/06/2017 CLINICAL DATA:  Diabetic patient with a wound on the right heel. EXAM: RIGHT FOOT COMPLETE - 3+ VIEW COMPARISON:  Plain films right foot 02/22/2017. FINDINGS: Since the prior examination, the patient has undergone amputation of the third metatarsal at the mid diaphysis. No bony destructive change or periosteal reaction is identified. Plantar calcaneal spur is noted. Small linear focus of superficial soft tissue gas is seen in the plantar soft tissues of the foot consistent with an ulceration. Severe and diffuse soft tissue edema is present about the foot. IMPRESSION: Severe, diffuse soft tissue edema about the right foot without plain film evidence of osteomyelitis. Small focus of linear gas in the superficial plantar soft tissues of the foot consistent with skin ulceration. Status post amputation at the level of the mid third metatarsal without complicating feature. Electronically Signed   By: Inge Rise M.D.   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 - patient is on Lexapro at this time- continue   DVT prophylaxis: Lovenox Code Status: Full code  Family Communication:   Disposition Plan:  med/surg bed  Consults called: Ortho, Dr Sharol Given  Admission status: Inpatient     [name] MD [facility] Pager: [email or web address] Password [email or web address] 7PM-7AM, please contact night-coverage   11/06/2017, 1:36 PM

## 2017-11-06 NOTE — Progress Notes (Signed)
Patient ID: Gina [name], female   DOB: August 25, 1960, 57 y.o.   MRN: [id number] I spoke with the patient on the phone per her request.  Discussed that she will need admission for IV antibiotics.  I will order a MRI scan of her foot as well as ankle-brachial indices to further evaluate her vascular status.  The infection does not appear to be coming from the third ray amputation.

## 2017-11-06 NOTE — ED Notes (Signed)
ED TO INPATIENT HANDOFF REPORT  Name/Age/Gender Gina [name] 57 y.o. female  Code Status    Code Status Orders  (From admission, onward)         Start     Ordered   11/06/17 1208  Full code  Continuous     11/06/17 1208        Code Status History    Date Active Date Inactive Code Status Order ID Comments User Context   02/22/2017 2342 02/26/2017 2155 Full Code [id number]  [name], MD ED   02/20/2017 2300 02/21/2017 2220 Full Code [id number]  Ward, [name], PA-C ED   06/01/2016 1944 06/07/2016 2220 Full Code [id number]  [name], MD Inpatient   06/18/2013 1157 06/21/2013 1240 Full Code [id number]  [name], MD ED   07/31/2012 0518 08/03/2012 1526 Full Code [id number]  Theressa Millard, MD ED      Home/SNF/Other Home  Chief Complaint Infection  Level of Care/Admitting Diagnosis ED Disposition    ED Disposition Condition Comment   [place] [facility]: [facility] [[id number]]  Level of Care: Med-Surg [16]  Diagnosis: Cellulitis and abscess of foot [416606]  Admitting Physician: [place], [place]  Attending Physician: [name] [[id number]]  Estimated length of stay: past midnight tomorrow  Certification:: I certify this patient will need inpatient services for at least 2 midnights  PT Class (Do Not Modify): Inpatient [101]  PT Acc Code (Do Not Modify): Private [1]       Medical History Past Medical History:  Diagnosis Date  . Diabetes mellitus   . Hypertension   . Osteomyelitis of right foot ([place]) 02/22/2017  . Schizophrenia ([place])   . Septic arthritis of interphalangeal joint of toe of left foot ([place]) 06/02/2016  . Stress incontinence 04/26/2017    Allergies Allergies  Allergen Reactions  . Metformin And Related Other (See Comments)    Upset stomach    IV Location/Drains/Wounds Patient Lines/Drains/Airways Status   Active Line/Drains/Airways    Name:   Placement date:   Placement time:   Site:   Days:   Peripheral IV 11/06/17 Right Hand   11/06/17    1101    Hand   less than 1   Negative Pressure Wound Therapy Foot Left   06/05/16    1719    -   519   Incision (Closed) 02/23/17 Leg Right   02/23/17    1559     256          Labs/Imaging Results for orders placed or performed during the hospital encounter of 11/06/17 (from the past 48 hour(s))  CBG monitoring, ED     Status: Abnormal   Collection Time: 11/06/17 10:03 AM  Result Value Ref Range   Glucose-Capillary 450 (H) 70 - 99 mg/dL  Comprehensive metabolic panel     Status: Abnormal   Collection Time: 11/06/17 10:58 AM  Result Value Ref Range   Sodium 134 (L) 135 - 145 mmol/L   Potassium 4.3 3.5 - 5.1 mmol/L   Chloride 96 (L) 98 - 111 mmol/L   CO2 28 22 - 32 mmol/L   Glucose, Bld 464 (H) 70 - 99 mg/dL   BUN 12 6 - 20 mg/dL   Creatinine, Ser 0.63 0.44 - 1.00 mg/dL   Calcium 8.9 8.9 - 10.3 mg/dL   Total Protein 7.4 6.5 - 8.1 g/dL   Albumin 2.7 (L) 3.5 - 5.0 g/dL   AST 17 15 - 41 U/L  ALT 16 0 - 44 U/L   Alkaline Phosphatase 84 38 - 126 U/L   Total Bilirubin 0.4 0.3 - 1.2 mg/dL   GFR calc non Af Amer >60 >60 mL/min   GFR calc Af Amer >60 >60 mL/min    Comment: (NOTE) The eGFR has been calculated using the CKD EPI equation. This calculation has not been validated in all clinical situations. eGFR's persistently <60 mL/min signify possible Chronic Kidney Disease.    Anion gap 10 5 - 15    Comment: Performed at [facility], [place] [address], [place], [place] [address]  CBC with Differential     Status: Abnormal   Collection Time: 11/06/17 10:58 AM  Result Value Ref Range   WBC 20.4 (H) 4.0 - 10.5 K/uL   RBC 3.35 (L) 3.87 - 5.11 MIL/uL   Hemoglobin 10.0 (L) 12.0 - 15.0 g/dL   HCT 30.4 (L) 36.0 - 46.0 %   MCV 90.7 78.0 - 100.0 fL   MCH 29.9 26.0 - 34.0 pg   MCHC 32.9 30.0 - 36.0 g/dL   RDW 12.9 11.5 - 15.5 %   Platelets 338 150 - 400 K/uL   Neutrophils Relative % 80 %   Lymphocytes Relative 10 %    Monocytes Relative 10 %   Eosinophils Relative 0 %   Basophils Relative 0 %   Neutro Abs 16.4 (H) 1.7 - 7.7 K/uL   Lymphs Abs 2.0 0.7 - 4.0 K/uL   Monocytes Absolute 2.0 (H) 0.1 - 1.0 K/uL   Eosinophils Absolute 0.0 0.0 - 0.7 K/uL   Basophils Absolute 0.0 0.0 - 0.1 K/uL   WBC Morphology WHITE COUNT CONFIRMED ON SMEAR     Comment: Performed at [facility], [place] [address] [place], [place] [address]  I-Stat CG4 Lactic Acid, ED     Status: None   Collection Time: 11/06/17 11:09 AM  Result Value Ref Range   Lactic Acid, Venous 1.20 0.5 - 1.9 mmol/L   Dg Foot Complete Right  Result Date: 11/06/2017 CLINICAL DATA:  Diabetic patient with a wound on the right heel. EXAM: RIGHT FOOT COMPLETE - 3+ VIEW COMPARISON:  Plain films right foot 02/22/2017. FINDINGS: Since the prior examination, the patient has undergone amputation of the third metatarsal at the mid diaphysis. No bony destructive change or periosteal reaction is identified. Plantar calcaneal spur is noted. Small linear focus of superficial soft tissue gas is seen in the plantar soft tissues of the foot consistent with an ulceration. Severe and diffuse soft tissue edema is present about the foot. IMPRESSION: Severe, diffuse soft tissue edema about the right foot without plain film evidence of osteomyelitis. Small focus of linear gas in the superficial plantar soft tissues of the foot consistent with skin ulceration. Status post amputation at the level of the mid third metatarsal without complicating feature. Electronically Signed   By: Inge Rise M.D.   On: 11/06/2017 11:24    Pending Labs Unresulted Labs (From admission, onward)    Start     Ordered   11/13/17 0500  Creatinine, serum  (enoxaparin (LOVENOX)    CrCl >/= 30 ml/min)  Weekly,   R    Comments:  while on enoxaparin therapy    11/06/17 1208   11/07/17 8016  Basic metabolic panel  Tomorrow morning,   R     11/06/17 1208   11/07/17 0500  CBC  Tomorrow  morning,   R     11/06/17 1208  11/06/17 1049  Urinalysis, Routine w reflex microscopic  STAT,   STAT     11/06/17 1048   11/06/17 1023  Culture, blood (routine x 2)  BLOOD CULTURE X 2,   STAT     11/06/17 1023          Vitals/Pain Today's Vitals   11/06/17 1005 11/06/17 1107 11/06/17 1202 11/06/17 1251  BP:  (!) 166/77 (!) 183/87 (!) 173/95  Pulse:  93 88 93  Resp:  (!) 21 (!) 27 18  Temp: (!) 100.8 F (38.2 C)     TempSrc: Oral     SpO2:  97% 98% 97%  Weight:      Height:      PainSc:        Isolation Precautions No active isolations  Medications Medications  vancomycin (VANCOCIN) 1,500 mg in sodium chloride 0.9 % 500 mL IVPB (1,500 mg Intravenous New Bag/Given 11/06/17 1257)  sodium chloride 0.9 % bolus 1,000 mL (1,000 mLs Intravenous New Bag/Given 11/06/17 1218)  escitalopram (LEXAPRO) tablet 20 mg (has no administration in time range)  ferrous sulfate tablet 325 mg (has no administration in time range)  gabapentin (NEURONTIN) capsule 300 mg (has no administration in time range)  Insulin Glargine (LANTUS) Solostar Pen 25 Units (has no administration in time range)  linagliptin (TRADJENTA) tablet 5 mg (has no administration in time range)  lisinopril (PRINIVIL,ZESTRIL) tablet 5 mg (has no administration in time range)  enoxaparin (LOVENOX) injection 40 mg (has no administration in time range)  sodium chloride flush (NS) 0.9 % injection 3 mL (has no administration in time range)  sodium chloride flush (NS) 0.9 % injection 3 mL (has no administration in time range)  0.9 %  sodium chloride infusion (has no administration in time range)  acetaminophen (TYLENOL) tablet 650 mg (has no administration in time range)    Or  acetaminophen (TYLENOL) suppository 650 mg (has no administration in time range)  ondansetron (ZOFRAN) tablet 4 mg (has no administration in time range)    Or  ondansetron (ZOFRAN) injection 4 mg (has no administration in time range)  traMADol (ULTRAM)  tablet 100 mg (has no administration in time range)  morphine 2 MG/ML injection 2 mg (has no administration in time range)  insulin aspart (novoLOG) injection 0-15 Units (has no administration in time range)  insulin aspart (novoLOG) injection 0-5 Units (has no administration in time range)  ceFEPIme (MAXIPIME) 2 g in sodium chloride 0.9 % 100 mL IVPB (0 g Intravenous Stopped 11/06/17 1258)    Mobility walks with device

## 2017-11-06 NOTE — Progress Notes (Signed)
A consult was received from an ED physician for vancomycin and cefepime per pharmacy dosing.  The patient's profile has been reviewed for ht/wt/allergies/indication/available labs.   A one time order has been placed for vancomycin 1500 mg and cefepime 2 gm.    Further antibiotics/pharmacy consults should be ordered by admitting physician if indicated.                       Thank you, Eudelia Bunch, Pharm.D [phone] 11/06/2017 11:27 AM

## 2017-11-06 NOTE — Progress Notes (Signed)
Patient arrived to unit at 1500. Alert & oriented x4. Vital signs in Epic. Patient stated that she hadn't bathed today and requested to be bathed immediately. NT and nursing student cleaned patient up. This RN hooked patient back up to Vancomycin that was started down in the ED. This RN gave report to [facility] who is taking over care.

## 2017-11-06 NOTE — Consult Note (Signed)
ORTHOPAEDIC CONSULTATION  REQUESTING PHYSICIAN: [name], MD  Chief Complaint: Cellulitis abscess right heel.  HPI: Gina [name] is a 57 y.o. female who presents with cellulitis swelling abscess left heel.  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  She has progressed well from this surgery.  Patient presents at this time with acute history of ulceration swelling pain over the plantar aspect of the right heel.  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  Past Medical History:  Diagnosis Date  . Diabetes mellitus   . Hypertension   . Osteomyelitis of right foot ([place]) 02/22/2017  . Schizophrenia ([place])   . Septic arthritis of interphalangeal joint of toe of left foot ([place]) 06/02/2016  . Stress incontinence 04/26/2017   Past Surgical History:  Procedure Laterality Date  . AMPUTATION Left 06/05/2016   Procedure: LEFT FOOT TRANSMETATARSAL AMPUTATION;  Surgeon: [name], MD;  Location: [facility];  Service: Orthopedics;  Laterality: Left;  . AMPUTATION TOE Right 02/23/2017   Procedure: AMPUTATION RIGHT THIRD TOE;  Surgeon: [name], MD;  Location: [facility];  Service: Orthopedics;  Laterality: Right;  . TUBAL LIGATION     Social History   Socioeconomic History  . Marital status: Single    Spouse name: Not on file  . Number of children: Not on file  . Years of education: Not on file  . Highest education level: Not on file  Occupational History  . Not on file  Social Needs  . Financial resource strain: Not on file  . Food insecurity:    Worry: Not on file    Inability: Not on file  . Transportation needs:    Medical: Not on file    Non-medical: Not on file  Tobacco Use  . Smoking status: Never Smoker  . Smokeless tobacco: Never Used  Substance and Sexual Activity  . Alcohol use: No    Comment: occasionally  . Drug use: No  . Sexual activity: Not Currently  Lifestyle  . Physical activity:   Days per week: Not on file    Minutes per session: Not on file  . Stress: Not on file  Relationships  . Social connections:    Talks on phone: Not on file    Gets together: Not on file    Attends religious service: Not on file    Active member of club or organization: Not on file    Attends meetings of clubs or organizations: Not on file    Relationship status: Not on file  Other Topics Concern  . Not on file  Social History Narrative  . Not on file   Family History  Problem Relation Age of Onset  . Diabetes type II Father   . CAD Father   . Prostate cancer Father   . Diabetes Mellitus II Brother    - negative except otherwise stated in the family history section Allergies  Allergen Reactions  . Metformin And Related Other (See Comments)    Upset stomach   Prior to Admission medications   Medication Sig Start Date End Date Taking? Authorizing Provider  doxycycline (VIBRA-TABS) 100 MG tablet Take 1 tablet (100 mg total) by mouth 2 (two) times daily. 11/01/17  Yes Freeman Caldron M, PA-C  escitalopram (LEXAPRO) 20 MG tablet Take 1 tablet (20 mg total) by mouth daily. 10/01/17  Yes Newlin, Charlane Ferretti, MD  ferrous sulfate 325 (65 FE) MG tablet Take 1 tablet (  325 mg total) by mouth 2 (two) times daily with a meal. 10/04/17  Yes McClung, Angela M, PA-C  furosemide (LASIX) 20 MG tablet Take 1 daily for swelling 11/01/17  Yes McClung, Angela M, PA-C  gabapentin (NEURONTIN) 300 MG capsule Take 1 capsule (300 mg total) by mouth at bedtime. 11/01/17  Yes McClung, Angela M, PA-C  Insulin Glargine (LANTUS SOLOSTAR) 100 UNIT/ML Solostar Pen INJECT 30 UNITS INTO THE SKIN DAILY AT 10 PM. 10/04/17  Yes McClung, Angela M, PA-C  lisinopril (PRINIVIL,ZESTRIL) 5 MG tablet Take 1 tablet (5 mg total) by mouth daily. 10/04/17  Yes McClung, Dionne Bucy, PA-C  sitaGLIPtin (JANUVIA) 100 MG tablet Take 1 tablet (100 mg total) by mouth daily. 10/04/17  Yes Argentina Donovan, PA-C  acetaminophen (TYLENOL) 325 MG tablet  Take 2 tablets (650 mg total) by mouth every 6 (six) hours as needed for mild pain (or Fever >/= 101). 06/07/16   [name], MD  bisacodyl (DULCOLAX) 10 MG suppository Place 1 suppository (10 mg total) rectally daily as needed for moderate constipation. 06/07/16   [name], MD  blood glucose meter kit and supplies KIT Dispense based on patient and insurance preference. Use up to four times daily as directed. (FOR ICD-9 250.00, 250.01). 02/26/17   Kayleen Memos, DO  Blood Glucose Monitoring Suppl (ACCU-CHEK AVIVA PLUS) w/Device KIT Use as directed TID. E11.9 10/04/17   Argentina Donovan, PA-C  docusate sodium (COLACE) 100 MG capsule Take 1 capsule (100 mg total) by mouth 2 (two) times daily. 06/07/16   [name], MD  glucose blood (ACCU-CHEK AVIVA PLUS) test strip Use as instructed 10/04/17   Argentina Donovan, PA-C  Insulin Pen Needle (TRUEPLUS PEN NEEDLES) 32G X 4 MM MISC Use as directed with lantus 10/01/17   Charlott Rakes, MD  Insulin Syringe-Needle U-100 (BD INSULIN SYRINGE ULTRAFINE) 31G X 15/64" 0.5 ML MISC Use as directed 10/01/17   Charlott Rakes, MD  Lancets (ACCU-CHEK SOFT TOUCH) lancets Use as instructed 10/04/17   Argentina Donovan, PA-C  polyethylene glycol [name]) packet Take 17 g by mouth daily as needed for mild constipation. 06/07/16   [name], MD  senna-docusate (SENOKOT-S) 8.6-50 MG tablet Take 2 tablets by mouth daily. 06/07/16   [name], MD   Mr Foot Right Wo Contrast  Result Date: 11/06/2017 CLINICAL DATA:  Diabetic patient with right foot pain and swelling. History of prior amputation. EXAM: MRI OF THE RIGHT FOREFOOT WITHOUT CONTRAST TECHNIQUE: Multiplanar, multisequence MR imaging of the right forefoot was performed. No intravenous contrast was administered. COMPARISON:  Plain films right foot earlier today. FINDINGS: Bones/Joint/Cartilage The patient is status post amputation at the level of the mid diaphysis of the third metatarsal. Abnormal increased T2  and decreased T1 signal are seen in the distal 4.5 cm of the first metatarsal and throughout the proximal phalanx of the great toe. Milder degree of edema is present in the distal phalanx of the great toe. Abnormal edema and decreased T1 signal are also seen throughout the second metatarsal and proximal phalanx of the second toe. Remnant of the third metatarsal demonstrates diffusely abnormal signal. Abnormal signal is also present throughout almost the entire fourth metatarsal with sparing of the head and neck identified. Bone marrow signal is otherwise normal. Ligaments Intact. Muscles and Tendons No intramuscular fluid collection is identified. There is some atrophy of intrinsic musculature the foot. Soft tissues Diffuse and intense soft tissue edema is present about the dorsum of the foot. Somewhat  milder degree of edema is seen in the distal lower leg and in the plantar soft tissues. There appears to be a skin ulceration on the plantar surface of the foot. IMPRESSION: Extensive abnormal signal in the first through fourth metatarsals and in the proximal phalanges of the first and second toes most consistent with osteomyelitis. Diffuse and intense subcutaneous edema about the foot and a milder degree of edema about the ankle most consistent with cellulitis. Negative for abscess or septic joint. Electronically Signed   By: Inge Rise M.D.   On: 11/06/2017 15:15   Dg Foot Complete Right  Result Date: 11/06/2017 CLINICAL DATA:  Diabetic patient with a wound on the right heel. EXAM: RIGHT FOOT COMPLETE - 3+ VIEW COMPARISON:  Plain films right foot 02/22/2017. FINDINGS: Since the prior examination, the patient has undergone amputation of the third metatarsal at the mid diaphysis. No bony destructive change or periosteal reaction is identified. Plantar calcaneal spur is noted. Small linear focus of superficial soft tissue gas is seen in the plantar soft tissues of the foot consistent with an ulceration.  Severe and diffuse soft tissue edema is present about the foot. IMPRESSION: Severe, diffuse soft tissue edema about the right foot without plain film evidence of osteomyelitis. Small focus of linear gas in the superficial plantar soft tissues of the foot consistent with skin ulceration. Status post amputation at the level of the mid third metatarsal without complicating feature. Electronically Signed   By: Inge Rise M.D.   On: 11/06/2017 11:24   - pertinent xrays, CT, MRI studies were reviewed and independently interpreted  Positive ROS: All other systems have been reviewed and were otherwise negative with the exception of those mentioned in the HPI and as above.  Physical Exam: General: Alert, no acute distress Psychiatric: Patient is competent for consent with normal mood and affect Lymphatic: No axillary or cervical lymphadenopathy Cardiovascular: No pedal edema Respiratory: No cyanosis, no use of accessory musculature GI: No organomegaly, abdomen is soft and non-tender    Images:  _0 @  Labs:  Lab Results  Component Value Date   HGBA1C 12.9 (H) 10/04/2017   HGBA1C 15.4 (H) 02/22/2017   HGBA1C 14.3 (H) 06/01/2016   ESRSEDRATE 105 (H) 02/22/2017   ESRSEDRATE 114 (H) 06/01/2016   ESRSEDRATE 87 (H) 05/23/2016   CRP 7.7 (H) 02/23/2017   CRP 5.3 (H) 06/01/2016   CRP 5.9 (H) 05/23/2016   LABURIC 3.8 06/04/2016   LABURIC 4.1 06/01/2016   REPTSTATUS 02/27/2017 FINAL 02/23/2017   GRAMSTAIN  02/23/2017    ABUNDANT WBC PRESENT, PREDOMINANTLY PMN ABUNDANT GRAM POSITIVE COCCI FEW GRAM NEGATIVE RODS    CULT  02/23/2017    RARE STAPHYLOCOCCUS AUREUS MIXED ANAEROBIC FLORA PRESENT.  CALL LAB IF FURTHER IID REQUIRED.    LABORGA STAPHYLOCOCCUS AUREUS 02/23/2017    Lab Results  Component Value Date   ALBUMIN 2.7 (L) 11/06/2017   ALBUMIN 3.7 10/04/2017   ALBUMIN 3.4 (L) 02/20/2017   PREALBUMIN 5.1 (L) 02/23/2017   LABURIC 3.8 06/04/2016   LABURIC 4.1 06/01/2016     Neurologic: Patient does not have protective sensation bilateral lower extremities.   MUSCULOSKELETAL:   Skin: Examination patient has ulceration and cellulitis over the right heel.  There is no drainage.  Examination she has a strong dorsalis pedis pulse her surgical incision has healed well she has no tenderness to palpation and no ulcers over the forefoot her forefoot is essentially asymptomatic.  Review of the MRI scan does show edema in the  metatarsals but clinically she is asymptomatic in the metatarsals.  The MRI scan does not show an abscess plantarly where she is tender and where the skin is blistered and ulcerated over the heel.  Assessment: Assessment: Uncontrolled type 2 diabetes with ulceration and abscess right heel with moderate protein caloric malnutrition.  Plan: Plan: We will plan for surgical debridement of the right heel on Friday.  Do not feel that surgical intervention is needed for the forefoot, despite the MRI findings she is completely asymptomatic in her forefoot.  Patient will need to be transferred to [facility] non emergently on Wednesday or Thursday for planned surgery on Friday.  Thank you for the consult and the opportunity to see [name], MD [place] [phone] 5:18 PM

## 2017-11-07 ENCOUNTER — Inpatient Hospital Stay (HOSPITAL_COMMUNITY): Payer: Medicaid Other

## 2017-11-07 ENCOUNTER — Ambulatory Visit (INDEPENDENT_AMBULATORY_CARE_PROVIDER_SITE_OTHER): Payer: Self-pay | Admitting: Physician Assistant

## 2017-11-07 DIAGNOSIS — L0390 Cellulitis, unspecified: Secondary | ICD-10-CM

## 2017-11-07 LAB — BASIC METABOLIC PANEL
Anion gap: 7 (ref 5–15)
BUN: 13 mg/dL (ref 6–20)
CO2: 28 mmol/L (ref 22–32)
Calcium: 8.6 mg/dL — ABNORMAL LOW (ref 8.9–10.3)
Chloride: 100 mmol/L (ref 98–111)
Creatinine, Ser: 0.59 mg/dL (ref 0.44–1.00)
GFR calc Af Amer: >60 mL/min (ref >60)
GFR calc non Af Amer: >60 mL/min (ref >60)
GLUCOSE: 120 mg/dL — AB (ref 70–99)
Potassium: 4.7 mmol/L (ref 3.5–5.1)
Sodium: 135 mmol/L (ref 135–145)

## 2017-11-07 LAB — CBC
HCT: 30.6 % — ABNORMAL LOW (ref 36.0–46.0)
Hemoglobin: 9.9 g/dL — ABNORMAL LOW (ref 12.0–15.0)
MCH: 29.7 pg (ref 26.0–34.0)
MCHC: 32.4 g/dL (ref 30.0–36.0)
MCV: 91.9 fL (ref 78.0–100.0)
Platelets: 352 10*3/uL (ref 150–400)
RBC: 3.33 MIL/uL — ABNORMAL LOW (ref 3.87–5.11)
RDW: 13.2 % (ref 11.5–15.5)
WBC: 16.7 10*3/uL — ABNORMAL HIGH (ref 4.0–10.5)

## 2017-11-07 LAB — GLUCOSE, CAPILLARY
Glucose-Capillary: 123 mg/dL — ABNORMAL HIGH (ref 70–99)
Glucose-Capillary: 111 mg/dL — ABNORMAL HIGH (ref 70–99)
Glucose-Capillary: 114 mg/dL — ABNORMAL HIGH (ref 70–99)
Glucose-Capillary: 209 mg/dL — ABNORMAL HIGH (ref 70–99)

## 2017-11-07 LAB — SURGICAL PCR SCREEN
MRSA, PCR: NEGATIVE
Staphylococcus aureus: POSITIVE — AB

## 2017-11-07 MED ORDER — METRONIDAZOLE IN NACL 5-0.79 MG/ML-% IV SOLN
500.0000 mg | Freq: Three times a day (TID) | INTRAVENOUS | Status: DC
  Administered 2017-11-07 – 2017-11-13 (×18): 500 mg via INTRAVENOUS
  Filled 2017-11-07 (×18): qty 100

## 2017-11-07 NOTE — Progress Notes (Signed)
[facility] Progress Note  Patient: Gina [name] QAS:[id number]   PCP: Alfonse Spruce, FNP DOB: 05/20/1960   DOA: 11/06/2017   DOS: 11/07/2017   Date of Service: the patient was seen and examined on 11/07/2017  Subjective: Denies any acute complaint no nausea no vomiting no fever no chills.  Minimal appetite.  No diarrhea.  No constipation.  Brief hospital course: Pt. with PMH of type II DM, HTN, schizophrenia, prior S/P amputation of the left foot; admitted on 11/06/2017, presented with complaint of right foot swelling and drainage, was found to have right foot osteomyelitis. Currently further plan is continue IV antibiotics.  Assessment and Plan: 1.  Right foot osteomyelitis. Met sepsis criteria on admission. Patient was on doxycycline outpatient. Started on IV vancomycin and cefepime. Blood cultures currently pending. Orthopedic Dr. Sharol Given consulted, recommended incision and debridement of the area on the right heel without any amputation. Requesting Dr. Sharol Given to send cultures from the area. Patient may require infectious disease consultation as well given the MRI findings to suggest presence of osteomyelitis meaning patient will require long-term IV antibiotics although will consult them only after surgical debridement. Currently on Flagyl as well to cover anaerobes.  2.  Type 2 diabetes mellitus. Uncontrolled. Peripheral vascular complication. On Lantus and Januvia at home. Continuing them for now and continue sliding scale insulin. Hemoglobin A1c 12.9 in July 2019.  3.  Essential hypertension. Holding Lasix. Continue lisinopril. May require to hold it perioperatively. Blood pressure stable.  4.  Anemia of chronic disease. Monitor H&H.  5.  Schizophrenia. On Lexapro will continue.  Diet: Carb modified diet DVT Prophylaxis: subcutaneous Heparin  Advance goals of care discussion: Full code  Family Communication: no family was present at bedside, at the time of  interview.   Disposition:  Discharge to BE DETERMINED.  Consultants:Dr Sharol Given Procedures: ABI  Antibiotics: Anti-infectives (From admission, onward)   Start     Dose/Rate Route Frequency Ordered Stop   11/07/17 1200  vancomycin (VANCOCIN) 1,250 mg in sodium chloride 0.9 % 250 mL IVPB     1,250 mg 166.7 mL/hr over 90 Minutes Intravenous Every 24 hours 11/06/17 1345     11/07/17 1000  metroNIDAZOLE (FLAGYL) IVPB 500 mg     500 mg 100 mL/hr over 60 Minutes Intravenous Every 8 hours 11/07/17 0932     11/06/17 2000  ceFEPIme (MAXIPIME) 2 g in sodium chloride 0.9 % 100 mL IVPB     2 g 200 mL/hr over 30 Minutes Intravenous Every 8 hours 11/06/17 1345     11/06/17 1200  vancomycin (VANCOCIN) 1,500 mg in sodium chloride 0.9 % 500 mL IVPB     1,500 mg 250 mL/hr over 120 Minutes Intravenous  Once 11/06/17 1127 11/06/17 1457   11/06/17 1130  ceFEPIme (MAXIPIME) 2 g in sodium chloride 0.9 % 100 mL IVPB     2 g 200 mL/hr over 30 Minutes Intravenous  Once 11/06/17 1127 11/06/17 1258       Objective: Physical Exam: Vitals:   11/06/17 1535 11/06/17 2126 11/07/17 0534 11/07/17 1430  BP: (!) 183/94 130/81 (!) 159/87 137/72  Pulse: 74 79 79 85  Resp: _0 Temp: 98.7 F (37.1 C) 98.5 F (36.9 C) 99.1 F (37.3 C) 99.8 F (37.7 C)  TempSrc: Oral   Oral  SpO2: 95% 99% 99% 97%  Weight:      Height:        Intake/Output Summary (Last 24 hours) at 11/07/2017 1451 Last data  filed at 11/07/2017 1326 Gross per 24 hour  Intake 560.21 ml  Output 650 ml  Net -89.79 ml   Filed Weights   11/06/17 1002  Weight: 73.5 kg   General: Alert, Awake and Oriented to Time, Place and Person. Appear in mild distress, affect appropriate Eyes: PERRL, Conjunctiva normal ENT: Oral Mucosa clear moist. Neck: no JVD, no Abnormal Mass Or lumps Cardiovascular: S1 and S2 Present, no Murmur, Peripheral Pulses Present Respiratory: normal respiratory effort, Bilateral Air entry equal and Decreased, no use of  accessory muscle, Clear to Auscultation, no Crackles, no wheezes Abdomen: Bowel Sound present, Soft and no tenderness, no hernia Skin: right leg redness, no Rash, no induration Extremities: right Pedal edema, no calf tenderness Neurologic: Grossly no focal neuro deficit. Bilaterally Equal motor strength  Data Reviewed: CBC: Recent Labs  Lab 11/01/17 1639 11/06/17 1058 11/07/17 0602  WBC 12.0* 20.4* 16.7*  NEUTROABS 8.7* 16.4*  --   HGB 10.2* 10.0* 9.9*  HCT 31.3* 30.4* 30.6*  MCV 90 90.7 91.9  PLT 257 338 979   Basic Metabolic Panel: Recent Labs  Lab 11/06/17 1058 11/07/17 0602  NA 134* 135  K 4.3 4.7  CL 96* 100  CO2 28 28  GLUCOSE 464* 120*  BUN 12 13  CREATININE 0.63 0.59  CALCIUM 8.9 8.6*    Liver Function Tests: Recent Labs  Lab 11/06/17 1058  AST 17  ALT 16  ALKPHOS 84  BILITOT 0.4  PROT 7.4  ALBUMIN 2.7*   No results for input(s): LIPASE, AMYLASE in the last 168 hours. No results for input(s): AMMONIA in the last 168 hours. Coagulation Profile: No results for input(s): INR, PROTIME in the last 168 hours. Cardiac Enzymes: No results for input(s): CKTOTAL, CKMB, CKMBINDEX, TROPONINI in the last 168 hours. BNP (last 3 results) No results for input(s): PROBNP in the last 8760 hours. CBG: Recent Labs  Lab 11/06/17 1003 11/06/17 1656 11/06/17 2128 11/07/17 0806 11/07/17 1210  GLUCAP 450* 262* 171* 123* 111*   Studies: No results found.  Scheduled Meds: . enoxaparin (LOVENOX) injection  40 mg Subcutaneous Q24H  . escitalopram  20 mg Oral Daily  . ferrous sulfate  325 mg Oral BID WC  . gabapentin  300 mg Oral QHS  . insulin aspart  0-15 Units Subcutaneous TID WC  . insulin aspart  0-5 Units Subcutaneous QHS  . insulin glargine  25 Units Subcutaneous Q2200  . linagliptin  5 mg Oral Daily  . lisinopril  5 mg Oral Daily  . sodium chloride flush  3 mL Intravenous Q12H   Continuous Infusions: . sodium chloride 1,000 mL (11/06/17 1803)  .  ceFEPime (MAXIPIME) IV 2 g (11/07/17 1146)  . metronidazole 500 mg (11/07/17 1026)  . vancomycin 1,250 mg (11/07/17 1243)   PRN Meds: sodium chloride, acetaminophen **OR** acetaminophen, morphine injection, ondansetron **OR** ondansetron (ZOFRAN) IV, sodium chloride flush, traMADol  Time spent: 35 minutes  Author: Berle Mull, MD [facility] Pager: [phone] 11/07/2017 2:51 PM  If 7PM-7AM, please contact night-coverage at [email or web address], password [facility]

## 2017-11-07 NOTE — Progress Notes (Signed)
Report called to nurse, Iona Beard on [place] at [facility].  Patient transported via [facility].

## 2017-11-07 NOTE — Progress Notes (Signed)
VASCULAR LAB PRELIMINARY  ARTERIAL  ABI completed:    RIGHT    LEFT    PRESSURE WAVEFORM  PRESSURE WAVEFORM  BRACHIAL 169 Triphasic BRACHIAL 174 Triphasic  DP 191 Triphasic DP 200 Triphasic  PT 211 Triphasic PT 207 Triphasic    RIGHT LEFT  ABI 1.21 1.19   ABIs and Doppler waveforms indicate normal arterial flow at rest.  Tolbert Matheson, RVS 11/07/2017, 10:12 AM

## 2017-11-07 NOTE — Care Management Note (Deleted)
Case Management Note  Patient Details  Name: Gina [name] MRN: [id number] Date of Birth: Aug 09, 1960  Subjective/Objective:                  discharged  Action/Plan: Pt released to home with self care/no cm needs present/will make post follow up appointment on her own.  Expected Discharge Date:  (unknown)               Expected Discharge Plan:     In-House Referral:     Discharge planning Services  CM Consult  Post Acute Care Choice:    Choice offered to:     DME Arranged:    DME Agency:     HH Arranged:    HH Agency:     Status of Service:  Completed, signed off  If discussed at [employer] of Stay Meetings, dates discussed:    Additional Comments:  Leeroy Cha, RN 11/07/2017, 10:46 AM

## 2017-11-07 NOTE — Care Management Note (Signed)
Case Management Note  Patient Details  Name: Gina [name] MRN: [id number] Date of Birth: Jul 24, 1960  Subjective/Objective:                  58 y.o. female who presents with cellulitis swelling abscess left heel.  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  She has progressed well from this surgery.  Patient presents at this time with acute history of ulceration swelling pain over the plantar aspect of the right heel.  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  Action/Plan: Assessment: Uncontrolled type 2 diabetes with ulceration and abscess right heel with moderate protein caloric malnutrition.  Plan: Plan: We will plan for surgical debridement of the right heel on Friday.  Do not feel that surgical intervention is needed for the forefoot, despite the MRI findings she is completely asymptomatic in her forefoot.  Patient will need to be transferred to [facility] non emergently on Wednesday or Thursday for planned surgery on Friday.   Expected Discharge Date:  (unknown)               Expected Discharge Plan:  Home/Self Care  In-House Referral:     Discharge planning Services  CM Consult  Post Acute Care Choice:    Choice offered to:     DME Arranged:    DME Agency:     HH Arranged:    [place] Agency:     Status of Service:  Completed, signed off  If discussed at [employer] of Stay Meetings, dates discussed:    Additional Comments:  Leeroy Cha, RN 11/07/2017, 11:34 AM

## 2017-11-07 NOTE — Plan of Care (Signed)
  Problem: Education: Goal: Knowledge of General Education information will improve Description Including pain rating scale, medication(s)/side effects and non-pharmacologic comfort measures Outcome: Progressing   Problem: Health Behavior/Discharge Planning: Goal: Ability to manage health-related needs will improve Outcome: Progressing   Problem: Clinical Measurements: Goal: Will remain free from infection Outcome: Progressing   Problem: Activity: Goal: Risk for activity intolerance will decrease Outcome: Progressing   Problem: Elimination: Goal: Will not experience complications related to bowel motility Outcome: Progressing   Problem: Pain Managment: Goal: General experience of comfort will improve Outcome: Progressing   Problem: Safety: Goal: Ability to remain free from injury will improve Outcome: Progressing

## 2017-11-08 LAB — GLUCOSE, CAPILLARY
GLUCOSE-CAPILLARY: 66 mg/dL — AB (ref 70–99)
GLUCOSE-CAPILLARY: 65 mg/dL — AB (ref 70–99)
Glucose-Capillary: 113 mg/dL — ABNORMAL HIGH (ref 70–99)
Glucose-Capillary: 99 mg/dL (ref 70–99)
Glucose-Capillary: 105 mg/dL — ABNORMAL HIGH (ref 70–99)
Glucose-Capillary: 157 mg/dL — ABNORMAL HIGH (ref 70–99)

## 2017-11-08 LAB — CBC
HCT: 29.9 % — ABNORMAL LOW (ref 36.0–46.0)
Hemoglobin: 9.6 g/dL — ABNORMAL LOW (ref 12.0–15.0)
MCH: 29.8 pg (ref 26.0–34.0)
MCHC: 32.1 g/dL (ref 30.0–36.0)
MCV: 92.9 fL (ref 78.0–100.0)
Platelets: 306 10*3/uL (ref 150–400)
RBC: 3.22 MIL/uL — ABNORMAL LOW (ref 3.87–5.11)
RDW: 12.7 % (ref 11.5–15.5)
WBC: 16.9 10*3/uL — ABNORMAL HIGH (ref 4.0–10.5)

## 2017-11-08 LAB — BASIC METABOLIC PANEL
ANION GAP: 6 (ref 5–15)
BUN: 10 mg/dL (ref 6–20)
CALCIUM: 8.2 mg/dL — AB (ref 8.9–10.3)
CO2: 29 mmol/L (ref 22–32)
CREATININE: 0.63 mg/dL (ref 0.44–1.00)
Chloride: 99 mmol/L (ref 98–111)
Glucose, Bld: 80 mg/dL (ref 70–99)
Potassium: 4.4 mmol/L (ref 3.5–5.1)
SODIUM: 134 mmol/L — AB (ref 135–145)

## 2017-11-08 MED ORDER — INSULIN GLARGINE 100 UNIT/ML ~~LOC~~ SOLN
22.0000 [IU] | Freq: Every day | SUBCUTANEOUS | Status: DC
  Administered 2017-11-08: 22 [IU] via SUBCUTANEOUS
  Filled 2017-11-08 (×2): qty 0.22

## 2017-11-08 MED ORDER — HYDRALAZINE HCL 20 MG/ML IJ SOLN
10.0000 mg | Freq: Four times a day (QID) | INTRAMUSCULAR | Status: DC | PRN
  Administered 2017-11-11: 10 mg via INTRAVENOUS
  Filled 2017-11-08: qty 1

## 2017-11-08 MED ORDER — CEFAZOLIN SODIUM-DEXTROSE 2-4 GM/100ML-% IV SOLN
2.0000 g | INTRAVENOUS | Status: AC
  Administered 2017-11-09: 2 g via INTRAVENOUS
  Filled 2017-11-08 (×2): qty 100

## 2017-11-08 MED ORDER — CHLORHEXIDINE GLUCONATE 4 % EX LIQD
60.0000 mL | Freq: Once | CUTANEOUS | Status: AC
  Administered 2017-11-09: 4 via TOPICAL
  Filled 2017-11-08: qty 60

## 2017-11-08 NOTE — Plan of Care (Signed)

## 2017-11-08 NOTE — H&P (View-Only) (Signed)
Patient ID: Gina [name], female   DOB: 1960-12-07, 57 y.o.   MRN: [id number] Patient is seen in follow-up for her heel abscess.  Patient is scheduled tomorrow for irrigation and debridement of the abscess.  All questions were answered patient wishes to proceed with surgery.

## 2017-11-08 NOTE — Progress Notes (Signed)
PROGRESS NOTE        PATIENT DETAILS Name: Gina [name] Age: 57 y.o. Sex: female Date of Birth: April 11, 1960 Admit Date: 11/06/2017 Admitting Physician [name], MD JJK:[id number], Maylon Peppers, FNP  Brief Narrative: Patient is a 57 y.o. 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  Evaluated by Dr. Sharol Given, plan is for irrigation and debridement on 8/30.  Subjective: Lying comfortably in bed-denies any chest pain or shortness of breath.  Assessment/Plan: Right diabetic foot with osteomyelitis and heel abscess: Continue empiric vancomycin cefepime and Flagyl.  Dr. Sharol Given following-with plans for irrigation and debridement tomorrow.  She is afebrile, nontoxic-appearing-leukocytosis has improved compared to admission.  Blood cultures on 8/27- so far.  Insulin-dependent DM-2: CBGs stable-mild hypoglycemia this morning, decrease Lantus to 22 units, continue SSI.  Hold all oral hypoglycemic agents for now.  Hypertension: Controlled, continue lisinopril  Normocytic anemia: Probably related to chronic disease-hemoglobin stable for close follow-up.  On iron supplementation.  Peripheral neuropathy: Probably related to diabetes-continue Neurontin  Schizophrenia: Appears to be stable, continue Lexapro  DVT Prophylaxis: Prophylactic Lovenox  Code Status: Full code   Family Communication: None at bedside  Disposition Plan: Remain inpatient-requires several more days of hospitalization-probably home health services on discharge.  Antimicrobial agents: Anti-infectives (From admission, onward)   Start     Dose/Rate Route Frequency Ordered Stop   11/07/17 1200  vancomycin (VANCOCIN) 1,250 mg in sodium chloride 0.9 % 250 mL IVPB     1,250 mg 166.7 mL/hr over 90 Minutes Intravenous Every 24 hours 11/06/17  1345     11/07/17 1000  metroNIDAZOLE (FLAGYL) IVPB 500 mg     500 mg 100 mL/hr over 60 Minutes Intravenous Every 8 hours 11/07/17 0932     11/06/17 2000  ceFEPIme (MAXIPIME) 2 g in sodium chloride 0.9 % 100 mL IVPB     2 g 200 mL/hr over 30 Minutes Intravenous Every 8 hours 11/06/17 1345     11/06/17 1200  vancomycin (VANCOCIN) 1,500 mg in sodium chloride 0.9 % 500 mL IVPB     1,500 mg 250 mL/hr over 120 Minutes Intravenous  Once 11/06/17 1127 11/07/17 1900   11/06/17 1130  ceFEPIme (MAXIPIME) 2 g in sodium chloride 0.9 % 100 mL IVPB     2 g 200 mL/hr over 30 Minutes Intravenous  Once 11/06/17 1127 11/06/17 1258      Procedures: None  CONSULTS:  orthopedic surgery  Time spent: 25- minutes-Greater than 50% of this time was spent in counseling, explanation of diagnosis, planning of further management, and coordination of care.  MEDICATIONS: Scheduled Meds: . enoxaparin (LOVENOX) injection  40 mg Subcutaneous Q24H  . escitalopram  20 mg Oral Daily  . ferrous sulfate  325 mg Oral BID WC  . gabapentin  300 mg Oral QHS  . insulin aspart  0-15 Units Subcutaneous TID WC  . insulin aspart  0-5 Units Subcutaneous QHS  . insulin glargine  25 Units Subcutaneous Q2200  . linagliptin  5 mg Oral Daily  . lisinopril  5 mg Oral Daily  . sodium chloride flush  3 mL Intravenous Q12H   Continuous Infusions: . sodium chloride Stopped (11/07/17 1243)  . ceFEPime (MAXIPIME) IV 2 g (11/08/17 1132)  . metronidazole 500 mg (  11/08/17 0940)  . vancomycin 1,250 mg (11/08/17 1255)   PRN Meds:.sodium chloride, acetaminophen **OR** acetaminophen, morphine injection, ondansetron **OR** ondansetron (ZOFRAN) IV, sodium chloride flush, traMADol   PHYSICAL EXAM: Vital signs: Vitals:   11/07/17 1430 11/07/17 2136 11/08/17 0933 11/08/17 1221  BP: 137/72 140/75 136/70 (!) 175/88  Pulse: 85 77  76  Resp: 16 16  16   Temp: 99.8 F (37.7 C) 98.9 F (37.2 C)  99 F (37.2 C)  TempSrc: Oral Oral  Oral    SpO2: 97% 99%  93%  Weight:      Height:       Filed Weights   11/06/17 1002  Weight: 73.5 kg   Body mass index is 29.63 kg/m.   General appearance :Awake, alert, HEENT: Atraumatic and Normocephalic Neck: supple Resp:Good air entry bilaterally, no added sounds  CVS: S1 S2 regular, no murmurs.  GI: Bowel sounds present, Non tender and not distended with no gaurding, rigidity or rebound.No organomegaly Extremities: B/L Lower Ext shows no edema, both legs are warm to touch Neurology:  speech clear,Non focal, sensation is grossly intact. Lower extremity: Swollen compared to the left, tender over the heel area-+ ulceration of the heel. Psychiatric: Normal judgment and insight. Alert and oriented x 3. Normal mood. Musculoskeletal:No digital cyanosis Skin:No Rash, warm and dry  I have personally reviewed following labs and imaging studies  LABORATORY DATA: CBC: Recent Labs  Lab 11/01/17 1639 11/06/17 1058 11/07/17 0602 11/08/17 0734  WBC 12.0* 20.4* 16.7* 16.9*  NEUTROABS 8.7* 16.4*  --   --   HGB 10.2* 10.0* 9.9* 9.6*  HCT 31.3* 30.4* 30.6* 29.9*  MCV 90 90.7 91.9 92.9  PLT 257 338 352 301    Basic Metabolic Panel: Recent Labs  Lab 11/06/17 1058 11/07/17 0602 11/08/17 0734  NA 134* 135 134*  K 4.3 4.7 4.4  CL 96* 100 99  CO2 28 28 29   GLUCOSE 464* 120* 80  BUN 12 13 10   CREATININE 0.63 0.59 0.63  CALCIUM 8.9 8.6* 8.2*    GFR: Estimated Creatinine Clearance: 73.8 mL/min (by C-G formula based on SCr of 0.63 mg/dL).  Liver Function Tests: Recent Labs  Lab 11/06/17 1058  AST 17  ALT 16  ALKPHOS 84  BILITOT 0.4  PROT 7.4  ALBUMIN 2.7*   No results for input(s): LIPASE, AMYLASE in the last 168 hours. No results for input(s): AMMONIA in the last 168 hours.  Coagulation Profile: No results for input(s): INR, PROTIME in the last 168 hours.  Cardiac Enzymes: No results for input(s): CKTOTAL, CKMB, CKMBINDEX, TROPONINI in the last 168 hours.  BNP  (last 3 results) No results for input(s): PROBNP in the last 8760 hours.  HbA1C: No results for input(s): HGBA1C in the last 72 hours.  CBG: Recent Labs  Lab 11/07/17 2135 11/08/17 0719 11/08/17 0811 11/08/17 0841 11/08/17 1108  GLUCAP 209* 66* 65* 113* 99    Lipid Profile: No results for input(s): CHOL, HDL, LDLCALC, TRIG, CHOLHDL, LDLDIRECT in the last 72 hours.  Thyroid Function Tests: No results for input(s): TSH, T4TOTAL, FREET4, T3FREE, THYROIDAB in the last 72 hours.  Anemia Panel: No results for input(s): VITAMINB12, FOLATE, FERRITIN, TIBC, IRON, RETICCTPCT in the last 72 hours.  Urine analysis:    Component Value Date/Time   COLORURINE STRAW (A) 02/20/2017 2359   APPEARANCEUR CLEAR 02/20/2017 2359   LABSPEC 1.030 02/20/2017 2359   PHURINE 6.0 02/20/2017 2359   GLUCOSEU >=500 (A) 02/20/2017 2359   HGBUR SMALL (A)  02/20/2017 [place] 02/20/2017 2359   BILIRUBINUR N 04/28/2016 1603   KETONESUR NEGATIVE 02/20/2017 2359   PROTEINUR NEGATIVE 02/20/2017 2359   UROBILINOGEN 0.2 04/28/2016 1603   UROBILINOGEN 0.2 06/18/2013 0650   NITRITE NEGATIVE 02/20/2017 2359   LEUKOCYTESUR NEGATIVE 02/20/2017 2359    Sepsis Labs: Lactic Acid, Venous    Component Value Date/Time   LATICACIDVEN 1.20 11/06/2017 1109    MICROBIOLOGY: Recent Results (from the past 240 hour(s))  Culture, blood (routine x 2)     Status: None (Preliminary result)   Collection Time: 11/06/17 10:23 AM  Result Value Ref Range Status   Specimen Description   Final    BLOOD LEFT ANTECUBITAL Performed at [facility], [place] [address], [place], [place] [address]    Special Requests   Final    BOTTLES DRAWN AEROBIC AND ANAEROBIC Blood Culture results may not be optimal due to an inadequate volume of blood received in culture bottles Performed at [facility] [address] [place], [place] [address]    Culture   Final    NO GROWTH 2  DAYS Performed at [facility] [address], [place], [place] [address]    Report Status PENDING  Incomplete  Culture, blood (routine x 2)     Status: None (Preliminary result)   Collection Time: 11/06/17 10:28 AM  Result Value Ref Range Status   Specimen Description   Final    BLOOD RIGHT HAND Performed at [facility] [address] [place], [place] [address]    Special Requests   Final    BOTTLES DRAWN AEROBIC AND ANAEROBIC Blood Culture results may not be optimal due to an inadequate volume of blood received in culture bottles Performed at [facility] [address] [place], [place] [address]    Culture   Final    NO GROWTH 2 DAYS Performed at [facility] [address] [place], [place] [address]    Report Status PENDING  Incomplete  Surgical pcr screen     Status: Abnormal   Collection Time: 11/07/17  6:55 PM  Result Value Ref Range Status   MRSA, PCR NEGATIVE NEGATIVE Final   Staphylococcus aureus POSITIVE (A) NEGATIVE Final    Comment: (NOTE) The Xpert SA Assay (FDA approved for NASAL specimens in patients 13 years of age and older), is one component of a comprehensive surveillance program. It is not intended to diagnose infection nor to guide or monitor treatment. Performed at [facility], [place] [address], [place], [place] [address]     RADIOLOGY STUDIES/RESULTS: Mr Foot Right Wo Contrast  Result Date: 11/06/2017 CLINICAL DATA:  Diabetic patient with right foot pain and swelling. History of prior amputation. EXAM: MRI OF THE RIGHT FOREFOOT WITHOUT CONTRAST TECHNIQUE: Multiplanar, multisequence MR imaging of the right forefoot was performed. No intravenous contrast was administered. COMPARISON:  Plain films right foot earlier today. FINDINGS: Bones/Joint/Cartilage The patient is status post amputation at the level of the mid diaphysis of the third metatarsal. Abnormal increased T2 and decreased T1 signal  are seen in the distal 4.5 cm of the first metatarsal and throughout the proximal phalanx of the great toe. Milder degree of edema is present in the distal phalanx of the great toe. Abnormal edema and decreased T1 signal are also seen throughout the second metatarsal and proximal phalanx of the second toe. Remnant of the third metatarsal demonstrates diffusely abnormal signal. Abnormal  signal is also present throughout almost the entire fourth metatarsal with sparing of the head and neck identified. Bone marrow signal is otherwise normal. Ligaments Intact. Muscles and Tendons No intramuscular fluid collection is identified. There is some atrophy of intrinsic musculature the foot. Soft tissues Diffuse and intense soft tissue edema is present about the dorsum of the foot. Somewhat milder degree of edema is seen in the distal lower leg and in the plantar soft tissues. There appears to be a skin ulceration on the plantar surface of the foot. IMPRESSION: Extensive abnormal signal in the first through fourth metatarsals and in the proximal phalanges of the first and second toes most consistent with osteomyelitis. Diffuse and intense subcutaneous edema about the foot and a milder degree of edema about the ankle most consistent with cellulitis. Negative for abscess or septic joint. Electronically Signed   By: Inge Rise M.D.   On: 11/06/2017 15:15   Dg Foot Complete Right  Result Date: 11/06/2017 CLINICAL DATA:  Diabetic patient with a wound on the right heel. EXAM: RIGHT FOOT COMPLETE - 3+ VIEW COMPARISON:  Plain films right foot 02/22/2017. FINDINGS: Since the prior examination, the patient has undergone amputation of the third metatarsal at the mid diaphysis. No bony destructive change or periosteal reaction is identified. Plantar calcaneal spur is noted. Small linear focus of superficial soft tissue gas is seen in the plantar soft tissues of the foot consistent with an ulceration. Severe and diffuse soft  tissue edema is present about the foot. IMPRESSION: Severe, diffuse soft tissue edema about the right foot without plain film evidence of osteomyelitis. Small focus of linear gas in the superficial plantar soft tissues of the foot consistent with skin ulceration. Status post amputation at the level of the mid third metatarsal without complicating feature. Electronically Signed   By: Inge Rise M.D.   On: 11/06/2017 11:24     LOS: 2 days   Oren Binet, MD  [facility]  If 7PM-7AM, please contact night-coverage  Please page via [email or web address] [email or web address]-click on MD name and type text message  11/08/2017, 1:17 PM

## 2017-11-08 NOTE — Progress Notes (Signed)
Patient ID: Gina [name], female   DOB: 11-23-1960, 57 y.o.   MRN: [id number] Patient is seen in follow-up for her heel abscess.  Patient is scheduled tomorrow for irrigation and debridement of the abscess.  All questions were answered patient wishes to proceed with surgery.

## 2017-11-09 ENCOUNTER — Inpatient Hospital Stay (HOSPITAL_COMMUNITY): Payer: Medicaid Other | Admitting: Certified Registered"

## 2017-11-09 ENCOUNTER — Encounter (HOSPITAL_COMMUNITY): Payer: Self-pay | Admitting: *Deleted

## 2017-11-09 ENCOUNTER — Encounter (HOSPITAL_COMMUNITY)
Admission: EM | Disposition: A | Discharge status: Skilled Nursing Facility | Payer: Self-pay | Source: Home / Self Care | Attending: Internal Medicine

## 2017-11-09 HISTORY — PX: I&D EXTREMITY: SHX5045

## 2017-11-09 LAB — BASIC METABOLIC PANEL
ANION GAP: 6 (ref 5–15)
BUN: 8 mg/dL (ref 6–20)
CO2: 26 mmol/L (ref 22–32)
Calcium: 8.1 mg/dL — ABNORMAL LOW (ref 8.9–10.3)
Chloride: 99 mmol/L (ref 98–111)
Creatinine, Ser: 0.56 mg/dL (ref 0.44–1.00)
GFR calc Af Amer: >60 mL/min (ref >60)
GFR calc non Af Amer: >60 mL/min (ref >60)
Glucose, Bld: 105 mg/dL — ABNORMAL HIGH (ref 70–99)
POTASSIUM: 4 mmol/L (ref 3.5–5.1)
Sodium: 131 mmol/L — ABNORMAL LOW (ref 135–145)

## 2017-11-09 LAB — CBC
HEMATOCRIT: 31.9 % — AB (ref 36.0–46.0)
Hemoglobin: 10 g/dL — ABNORMAL LOW (ref 12.0–15.0)
MCH: 29.2 pg (ref 26.0–34.0)
MCHC: 31.3 g/dL (ref 30.0–36.0)
MCV: 93.3 fL (ref 78.0–100.0)
Platelets: 368 10*3/uL (ref 150–400)
RBC: 3.42 MIL/uL — AB (ref 3.87–5.11)
RDW: 12.9 % (ref 11.5–15.5)
WBC: 18.5 10*3/uL — AB (ref 4.0–10.5)

## 2017-11-09 LAB — URINALYSIS, ROUTINE W REFLEX MICROSCOPIC
Bilirubin Urine: NEGATIVE
Glucose, UA: NEGATIVE mg/dL
Ketones, ur: NEGATIVE mg/dL
LEUKOCYTES UA: NEGATIVE
Nitrite: NEGATIVE
PROTEIN: 30 mg/dL — AB
RBC / HPF: >50 RBC/hpf — ABNORMAL HIGH (ref 0–5)
Specific Gravity, Urine: 1.013 (ref 1.005–1.030)
pH: 6 (ref 5.0–8.0)

## 2017-11-09 LAB — GLUCOSE, CAPILLARY
GLUCOSE-CAPILLARY: 119 mg/dL — AB (ref 70–99)
GLUCOSE-CAPILLARY: 81 mg/dL (ref 70–99)
Glucose-Capillary: 88 mg/dL (ref 70–99)
Glucose-Capillary: 89 mg/dL (ref 70–99)
Glucose-Capillary: 84 mg/dL (ref 70–99)
Glucose-Capillary: 98 mg/dL (ref 70–99)
Glucose-Capillary: 76 mg/dL (ref 70–99)

## 2017-11-09 LAB — SODIUM, URINE, RANDOM: SODIUM UR: 78 mmol/L

## 2017-11-09 SURGERY — IRRIGATION AND DEBRIDEMENT EXTREMITY
Anesthesia: General | Laterality: Right

## 2017-11-09 MED ORDER — FENTANYL CITRATE (PF) 250 MCG/5ML IJ SOLN
INTRAMUSCULAR | Status: AC
  Filled 2017-11-09: qty 5

## 2017-11-09 MED ORDER — MIDAZOLAM HCL 2 MG/2ML IJ SOLN
INTRAMUSCULAR | Status: AC
  Filled 2017-11-09: qty 2

## 2017-11-09 MED ORDER — INSULIN GLARGINE 100 UNIT/ML ~~LOC~~ SOLN
12.0000 [IU] | Freq: Every day | SUBCUTANEOUS | Status: DC
  Filled 2017-11-09: qty 0.12

## 2017-11-09 MED ORDER — SODIUM CHLORIDE 0.9 % IV SOLN
INTRAVENOUS | Status: AC
  Administered 2017-11-09: 09:00:00 via INTRAVENOUS

## 2017-11-09 MED ORDER — FENTANYL CITRATE (PF) 100 MCG/2ML IJ SOLN
INTRAMUSCULAR | Status: DC | PRN
  Administered 2017-11-09 (×2): 50 ug via INTRAVENOUS

## 2017-11-09 MED ORDER — METHOCARBAMOL 1000 MG/10ML IJ SOLN
500.0000 mg | Freq: Four times a day (QID) | INTRAVENOUS | Status: DC | PRN
  Filled 2017-11-09: qty 5

## 2017-11-09 MED ORDER — MIDAZOLAM HCL 5 MG/5ML IJ SOLN
INTRAMUSCULAR | Status: DC | PRN
  Administered 2017-11-09: 1 mg via INTRAVENOUS

## 2017-11-09 MED ORDER — METOCLOPRAMIDE HCL 5 MG PO TABS: 5.0000 mg | ORAL_TABLET | Freq: Three times a day (TID) | ORAL | Status: DC | PRN

## 2017-11-09 MED ORDER — OXYCODONE HCL 5 MG PO TABS
10.0000 mg | ORAL_TABLET | ORAL | Status: DC | PRN
  Administered 2017-11-09: 15 mg via ORAL
  Filled 2017-11-09: qty 3

## 2017-11-09 MED ORDER — ONDANSETRON HCL 4 MG PO TABS: 4.0000 mg | ORAL_TABLET | Freq: Four times a day (QID) | ORAL | Status: DC | PRN

## 2017-11-09 MED ORDER — EPHEDRINE 5 MG/ML INJ
INTRAVENOUS | Status: AC
  Filled 2017-11-09: qty 10

## 2017-11-09 MED ORDER — ACETAMINOPHEN 325 MG PO TABS: 325.0000 mg | ORAL_TABLET | Freq: Four times a day (QID) | ORAL | Status: DC | PRN

## 2017-11-09 MED ORDER — MAGNESIUM CITRATE PO SOLN: 1.0000 | Freq: Once | ORAL | Status: DC | PRN

## 2017-11-09 MED ORDER — PROPOFOL 10 MG/ML IV BOLUS
INTRAVENOUS | Status: AC
  Filled 2017-11-09: qty 20

## 2017-11-09 MED ORDER — HYDROMORPHONE HCL 1 MG/ML IJ SOLN: 0.5000 mg | INTRAMUSCULAR | Status: DC | PRN

## 2017-11-09 MED ORDER — PROMETHAZINE HCL 25 MG/ML IJ SOLN: 6.2500 mg | INTRAMUSCULAR | Status: DC | PRN

## 2017-11-09 MED ORDER — ONDANSETRON HCL 4 MG/2ML IJ SOLN
INTRAMUSCULAR | Status: AC
  Filled 2017-11-09: qty 4

## 2017-11-09 MED ORDER — DOCUSATE SODIUM 100 MG PO CAPS
100.0000 mg | ORAL_CAPSULE | Freq: Two times a day (BID) | ORAL | Status: DC
  Administered 2017-11-09 – 2017-11-15 (×9): 100 mg via ORAL
  Filled 2017-11-09 (×14): qty 1

## 2017-11-09 MED ORDER — LACTATED RINGERS IV SOLN
INTRAVENOUS | Status: DC
  Administered 2017-11-09: 10:00:00 via INTRAVENOUS

## 2017-11-09 MED ORDER — LIDOCAINE HCL (CARDIAC) PF 100 MG/5ML IV SOSY
PREFILLED_SYRINGE | INTRAVENOUS | Status: DC | PRN
  Administered 2017-11-09: 60 mg via INTRAVENOUS

## 2017-11-09 MED ORDER — CHLORHEXIDINE GLUCONATE CLOTH 2 % EX PADS
6.0000 | MEDICATED_PAD | Freq: Every day | CUTANEOUS | Status: AC
  Administered 2017-11-09 – 2017-11-13 (×3): 6 via TOPICAL

## 2017-11-09 MED ORDER — FENTANYL CITRATE (PF) 100 MCG/2ML IJ SOLN: 25.0000 ug | INTRAMUSCULAR | Status: DC | PRN

## 2017-11-09 MED ORDER — PROPOFOL 10 MG/ML IV BOLUS
INTRAVENOUS | Status: DC | PRN
  Administered 2017-11-09: 160 mg via INTRAVENOUS

## 2017-11-09 MED ORDER — METOCLOPRAMIDE HCL 5 MG/ML IJ SOLN: 5.0000 mg | Freq: Three times a day (TID) | INTRAMUSCULAR | Status: DC | PRN

## 2017-11-09 MED ORDER — EPHEDRINE SULFATE 50 MG/ML IJ SOLN
INTRAMUSCULAR | Status: DC | PRN
  Administered 2017-11-09 (×2): 10 mg via INTRAVENOUS

## 2017-11-09 MED ORDER — MUPIROCIN 2 % EX OINT
1.0000 "application " | TOPICAL_OINTMENT | Freq: Two times a day (BID) | CUTANEOUS | Status: AC
  Administered 2017-11-09 – 2017-11-13 (×9): 1 via NASAL
  Filled 2017-11-09: qty 22

## 2017-11-09 MED ORDER — SODIUM CHLORIDE 0.9 % IV SOLN
INTRAVENOUS | Status: DC
  Administered 2017-11-09: 15:00:00 via INTRAVENOUS

## 2017-11-09 MED ORDER — ONDANSETRON HCL 4 MG/2ML IJ SOLN
4.0000 mg | Freq: Four times a day (QID) | INTRAMUSCULAR | Status: DC | PRN
  Filled 2017-11-09 (×2): qty 2

## 2017-11-09 MED ORDER — POLYETHYLENE GLYCOL 3350 17 G PO PACK: 17.0000 g | PACK | Freq: Every day | ORAL | Status: DC | PRN

## 2017-11-09 MED ORDER — METHOCARBAMOL 500 MG PO TABS
500.0000 mg | ORAL_TABLET | Freq: Four times a day (QID) | ORAL | Status: DC | PRN
  Administered 2017-11-09 – 2017-11-10 (×2): 500 mg via ORAL
  Filled 2017-11-09 (×2): qty 1

## 2017-11-09 MED ORDER — 0.9 % SODIUM CHLORIDE (POUR BTL) OPTIME
TOPICAL | Status: DC | PRN
  Administered 2017-11-09: 1000 mL

## 2017-11-09 MED ORDER — OXYCODONE HCL 5 MG PO TABS
5.0000 mg | ORAL_TABLET | ORAL | Status: DC | PRN
  Administered 2017-11-10 – 2017-11-11 (×2): 5 mg via ORAL
  Filled 2017-11-09 (×3): qty 1

## 2017-11-09 MED ORDER — BISACODYL 10 MG RE SUPP: 10.0000 mg | Freq: Every day | RECTAL | Status: DC | PRN

## 2017-11-09 SURGICAL SUPPLY — 31 items
BLADE SAW SGTL MED 73X18.5 STR (BLADE) IMPLANT
BLADE SURG 21 STRL SS (BLADE) ×3 IMPLANT
BNDG COHESIVE 6X5 TAN STRL LF (GAUZE/BANDAGES/DRESSINGS) ×2 IMPLANT
BNDG GAUZE ELAST 4 BULKY (GAUZE/BANDAGES/DRESSINGS) ×4 IMPLANT
COVER SURGICAL LIGHT HANDLE (MISCELLANEOUS) ×6 IMPLANT
DRAPE U-SHAPE 47X51 STRL (DRAPES) ×3 IMPLANT
DRSG ADAPTIC 3X8 NADH LF (GAUZE/BANDAGES/DRESSINGS) ×3 IMPLANT
DURAPREP 26ML APPLICATOR (WOUND CARE) ×3 IMPLANT
ELECT REM PT RETURN 9FT ADLT (ELECTROSURGICAL)
ELECTRODE REM PT RTRN 9FT ADLT (ELECTROSURGICAL) IMPLANT
GAUZE SPONGE 4X4 12PLY STRL (GAUZE/BANDAGES/DRESSINGS) ×3 IMPLANT
GLOVE BIOGEL PI IND STRL 9 (GLOVE) ×1 IMPLANT
GLOVE BIOGEL PI INDICATOR 9 (GLOVE) ×2
GLOVE SURG ORTHO 9.0 STRL STRW (GLOVE) ×3 IMPLANT
GOWN STRL REUS W/ TWL XL LVL3 (GOWN DISPOSABLE) ×2 IMPLANT
GOWN STRL REUS W/TWL XL LVL3 (GOWN DISPOSABLE) ×4
HANDPIECE INTERPULSE COAX TIP (DISPOSABLE)
KIT BASIN OR (CUSTOM PROCEDURE TRAY) ×3 IMPLANT
KIT TURNOVER KIT B (KITS) ×3 IMPLANT
MANIFOLD NEPTUNE II (INSTRUMENTS) ×3 IMPLANT
NS IRRIG 1000ML POUR BTL (IV SOLUTION) ×3 IMPLANT
PACK ORTHO EXTREMITY (CUSTOM PROCEDURE TRAY) ×3 IMPLANT
PAD ARMBOARD 7.5X6 YLW CONV (MISCELLANEOUS) ×6 IMPLANT
SET HNDPC FAN SPRY TIP SCT (DISPOSABLE) IMPLANT
STOCKINETTE IMPERVIOUS 9X36 MD (GAUZE/BANDAGES/DRESSINGS) IMPLANT
SWAB COLLECTION DEVICE MRSA (MISCELLANEOUS) ×3 IMPLANT
SWAB CULTURE ESWAB REG 1ML (MISCELLANEOUS) IMPLANT
TOWEL OR 17X26 10 PK STRL BLUE (TOWEL DISPOSABLE) ×3 IMPLANT
TUBE CONNECTING 12'X1/4 (SUCTIONS) ×1
TUBE CONNECTING 12X1/4 (SUCTIONS) ×2 IMPLANT
YANKAUER SUCT BULB TIP NO VENT (SUCTIONS) ×3 IMPLANT

## 2017-11-09 NOTE — Anesthesia Procedure Notes (Signed)
Procedure Name: LMA Insertion Date/Time: 11/09/2017 12:28 PM Performed by: Lance Coon, CRNA Pre-anesthesia Checklist: Emergency Drugs available, Suction available, Patient identified, Patient being monitored and Timeout performed Patient Re-evaluated:Patient Re-evaluated prior to induction Oxygen Delivery Method: Circle system utilized Preoxygenation: Pre-oxygenation with 100% oxygen Induction Type: IV induction LMA: LMA inserted LMA Size: 4.0 Number of attempts: 1 Placement Confirmation: positive ETCO2 and breath sounds checked- equal and bilateral Tube secured with: Tape Dental Injury: Teeth and Oropharynx as per pre-operative assessment

## 2017-11-09 NOTE — Transfer of Care (Signed)
Immediate Anesthesia Transfer of Care Note  Patient: Gina [name]  Procedure(s) Performed: Debride Ulcer Right Heel (Right )  Patient Location: PACU  Anesthesia Type:General  Level of Consciousness: awake and patient cooperative  Airway & Oxygen Therapy: Patient Spontanous Breathing  Post-op Assessment: Report given to RN and Post -op Vital signs reviewed and stable  Post vital signs: Reviewed and stable  Last Vitals:  Vitals Value Taken Time  BP 113/63 11/09/2017  1:00 PM  Temp    Pulse 91 11/09/2017  1:00 PM  Resp 15 11/09/2017  1:00 PM  SpO2 94 % 11/09/2017  1:00 PM  Vitals shown include unvalidated device data.  Last Pain:  Vitals:   11/09/17 0729  TempSrc:   PainSc: Asleep         Complications: No apparent anesthesia complications

## 2017-11-09 NOTE — Progress Notes (Signed)
PROGRESS NOTE        PATIENT DETAILS Name: Gina [name] Age: 57 y.o. Sex: female Date of Birth: May 10, 1960 Admit Date: 11/06/2017 Admitting Physician Gina Odea, MD EXB:[id number], Gina Peppers, FNP  Brief Narrative: Patient is a 56 y.o. 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  Evaluated by Dr. Sharol [name], plan is for irrigation and debridement on 8/30.  Subjective: She did bed denies any headache chest or abdominal pain, no shortness of breath or chest discomfort, mild right foot and ankle pain, waiting to go to the OR.  Assessment/Plan: Right diabetic foot with osteomyelitis and heel abscess: Continue empiric vancomycin cefepime and Flagyl.  Dr. Sharol [name] following-with plans for irrigation and debridement later on 11/09/2017.  She is afebrile, nontoxic-appearing-leukocytosis has improved compared to admission.  Blood cultures on 8/27- so far.  Insulin-dependent DM-2: CBGs stable-mild hypoglycemia this morning, currently on Lantus 22 units along with sliding scale, oral hypoglycemics on hold.  Since she was n.p.o. for almost half the day on 11/09/2017 have cut down Lantus to 12 units and will hold nighttime sliding scale.  Will allow mildly high sugars for today, will start getting more aggressive again from tomorrow once her oral intake is back to baseline.  CBG (last 3)  Recent Labs    11/09/17 0804 11/09/17 1004 11/09/17 1303  GLUCAP 88 89 81     Hypertension: Controlled, continue lisinopril  Normocytic anemia: Probably related to chronic disease-hemoglobin stable for close follow-up.  On iron supplementation.  Peripheral neuropathy: Probably related to diabetes-continue Neurontin  Schizophrenia: Appears to be stable, continue Lexapro   DVT Prophylaxis: Prophylactic Lovenox  Code  Status: Full code   Family Communication: None at bedside  Disposition Plan: Remain inpatient-requires several more days of hospitalization-probably home health services on discharge.  Antimicrobial agents: Anti-infectives (From admission, onward)   Start     Dose/Rate Route Frequency Ordered Stop   11/09/17 0600  ceFAZolin (ANCEF) IVPB 2g/100 mL premix     2 g 200 mL/hr over 30 Minutes Intravenous To [facility] 11/08/17 1934 11/09/17 1233   11/07/17 1200  vancomycin (VANCOCIN) 1,250 mg in sodium chloride 0.9 % 250 mL IVPB     1,250 mg 166.7 mL/hr over 90 Minutes Intravenous Every 24 hours 11/06/17 1345     11/07/17 1000  metroNIDAZOLE (FLAGYL) IVPB 500 mg     500 mg 100 mL/hr over 60 Minutes Intravenous Every 8 hours 11/07/17 0932     11/06/17 2000  ceFEPIme (MAXIPIME) 2 g in sodium chloride 0.9 % 100 mL IVPB     2 g 200 mL/hr over 30 Minutes Intravenous Every 8 hours 11/06/17 1345     11/06/17 1200  vancomycin (VANCOCIN) 1,500 mg in sodium chloride 0.9 % 500 mL IVPB     1,500 mg 250 mL/hr over 120 Minutes Intravenous  Once 11/06/17 1127 11/07/17 1900   11/06/17 1130  ceFEPIme (MAXIPIME) 2 g in sodium chloride 0.9 % 100 mL IVPB     2 g 200 mL/hr over 30 Minutes Intravenous  Once 11/06/17 1127 11/06/17 1258      Procedures: None  CONSULTS:  orthopedic surgery  Time spent: 25- minutes-Greater than 50% of this time was spent in counseling, explanation of diagnosis, planning of further  management, and coordination of care.  MEDICATIONS: Scheduled Meds: . Chlorhexidine Gluconate Cloth  6 each Topical Daily  . docusate sodium  100 mg Oral BID  . enoxaparin (LOVENOX) injection  40 mg Subcutaneous Q24H  . escitalopram  20 mg Oral Daily  . ferrous sulfate  325 mg Oral BID WC  . gabapentin  300 mg Oral QHS  . insulin aspart  0-15 Units Subcutaneous TID WC  . insulin aspart  0-5 Units Subcutaneous QHS  . insulin glargine  22 Units Subcutaneous Q2200  . lisinopril  5  mg Oral Daily  . mupirocin ointment  1 application Nasal BID  . sodium chloride flush  3 mL Intravenous Q12H   Continuous Infusions: . sodium chloride Stopped (11/07/17 1243)  . sodium chloride Stopped (11/09/17 0941)  . sodium chloride    . ceFEPime (MAXIPIME) IV 2 g (11/09/17 0334)  . lactated ringers Stopped (11/09/17 1252)  . methocarbamol (ROBAXIN) IV    . metronidazole 500 mg (11/09/17 0217)  . vancomycin 1,250 mg (11/08/17 1255)   PRN Meds:.sodium chloride, acetaminophen **OR** acetaminophen, [START ON 11/10/2017] acetaminophen, bisacodyl, hydrALAZINE, HYDROmorphone (DILAUDID) injection, magnesium citrate, methocarbamol **OR** methocarbamol (ROBAXIN) IV, metoCLOPramide **OR** metoCLOPramide (REGLAN) injection, morphine injection, ondansetron **OR** ondansetron (ZOFRAN) IV, ondansetron **OR** ondansetron (ZOFRAN) IV, oxyCODONE, oxyCODONE, polyethylene glycol, sodium chloride flush, traMADol   PHYSICAL EXAM: Vital signs: Vitals:   11/09/17 1329 11/09/17 1330 11/09/17 1345 11/09/17 1347  BP: (!) 144/73  (!) 129/96   Pulse: 81 80 79   Resp: 14 14 15    Temp:    98.3 F (36.8 C)  TempSrc:      SpO2: 97% 97% 98%   Weight:      Height:       Filed Weights   11/06/17 1002 11/09/17 0958  Weight: 73.5 kg 73 kg   Body mass index is 29.44 kg/m.   Exam  Awake Alert, Oriented X 3, No new F.N deficits, Normal affect Gina [name].AT,PERRAL Supple Neck,No JVD, No cervical lymphadenopathy appriciated.  Symmetrical Chest wall movement, Good air movement bilaterally, CTAB RRR,No Gallops, Rubs or new Murmurs, No Parasternal Heave +ve B.Sounds, Abd Soft, No tenderness, No organomegaly appriciated, No rebound - guarding or rigidity. No Cyanosis, Clubbing or edema, old left transmetatarsal amputation, right foot swollen especially around the ankle   I have personally reviewed following labs and imaging studies  LABORATORY DATA: CBC: Recent Labs  Lab 11/06/17 1058 11/07/17 0602  11/08/17 0734 11/09/17 0435  WBC 20.4* 16.7* 16.9* 18.5*  NEUTROABS 16.4*  --   --   --   HGB 10.0* 9.9* 9.6* 10.0*  HCT 30.4* 30.6* 29.9* 31.9*  MCV 90.7 91.9 92.9 93.3  PLT 338 352 306 384    Basic Metabolic Panel: Recent Labs  Lab 11/06/17 1058 11/07/17 0602 11/08/17 0734 11/09/17 0435  NA 134* 135 134* 131*  K 4.3 4.7 4.4 4.0  CL 96* 100 99 99  CO2 28 28 29 26   GLUCOSE 464* 120* 80 105*  BUN 12 13 10 8   CREATININE 0.63 0.59 0.63 0.56  CALCIUM 8.9 8.6* 8.2* 8.1*    GFR: Estimated Creatinine Clearance: 73.5 mL/min (by C-G formula based on SCr of 0.56 mg/dL).  Liver Function Tests: Recent Labs  Lab 11/06/17 1058  AST 17  ALT 16  ALKPHOS 84  BILITOT 0.4  PROT 7.4  ALBUMIN 2.7*   No results for input(s): LIPASE, AMYLASE in the last 168 hours. No results for input(s): AMMONIA in the last 168 hours.  Coagulation Profile: No results for input(s): INR, PROTIME in the last 168 hours.  Cardiac Enzymes: No results for input(s): CKTOTAL, CKMB, CKMBINDEX, TROPONINI in the last 168 hours.  BNP (last 3 results) No results for input(s): PROBNP in the last 8760 hours.  HbA1C: No results for input(s): HGBA1C in the last 72 hours.  CBG: Recent Labs  Lab 11/08/17 2106 11/09/17 0408 11/09/17 0804 11/09/17 1004 11/09/17 1303  GLUCAP 157* 119* 88 89 81    Lipid Profile: No results for input(s): CHOL, HDL, LDLCALC, TRIG, CHOLHDL, LDLDIRECT in the last 72 hours.  Thyroid Function Tests: No results for input(s): TSH, T4TOTAL, FREET4, T3FREE, THYROIDAB in the last 72 hours.  Anemia Panel: No results for input(s): VITAMINB12, FOLATE, FERRITIN, TIBC, IRON, RETICCTPCT in the last 72 hours.  Urine analysis:    Component Value Date/Time   COLORURINE YELLOW 11/09/2017 0929   APPEARANCEUR HAZY (A) 11/09/2017 0929   LABSPEC 1.013 11/09/2017 0929   PHURINE 6.0 11/09/2017 0929   GLUCOSEU NEGATIVE 11/09/2017 0929   HGBUR LARGE (A) 11/09/2017 0929   BILIRUBINUR  NEGATIVE 11/09/2017 0929   BILIRUBINUR N 04/28/2016 1603   KETONESUR NEGATIVE 11/09/2017 0929   PROTEINUR 30 (A) 11/09/2017 0929   UROBILINOGEN 0.2 04/28/2016 1603   UROBILINOGEN 0.2 06/18/2013 0650   NITRITE NEGATIVE 11/09/2017 0929   LEUKOCYTESUR NEGATIVE 11/09/2017 0929    Sepsis Labs: Lactic Acid, Venous    Component Value Date/Time   LATICACIDVEN 1.20 11/06/2017 1109    MICROBIOLOGY: Recent Results (from the past 240 hour(s))  Culture, blood (routine x 2)     Status: None (Preliminary result)   Collection Time: 11/06/17 10:23 AM  Result Value Ref Range Status   Specimen Description   Final    BLOOD LEFT ANTECUBITAL Performed at [facility], [place] [address] [place], [place] [address]    Special Requests   Final    BOTTLES DRAWN AEROBIC AND ANAEROBIC Blood Culture results may not be optimal due to an inadequate volume of blood received in culture bottles Performed at [place] [address] [place], [place] [address]    Culture   Final    NO GROWTH 2 DAYS Performed at [place] [place] [address] [place], [place] [address]    Report Status PENDING  Incomplete  Culture, blood (routine x 2)     Status: None (Preliminary result)   Collection Time: 11/06/17 10:28 AM  Result Value Ref Range Status   Specimen Description   Final    BLOOD RIGHT HAND Performed at [place] [address], [place], [place] [address]    Special Requests   Final    BOTTLES DRAWN AEROBIC AND ANAEROBIC Blood Culture results may not be optimal due to an inadequate volume of blood received in culture bottles Performed at [place] [address] [place], [place] [address]    Culture   Final    NO GROWTH 2 DAYS Performed at [place] [address] [place], [place] [address]    Report Status PENDING  Incomplete  Surgical pcr screen     Status: Abnormal   Collection Time: 11/07/17  6:55  PM  Result Value Ref Range Status   MRSA, PCR NEGATIVE NEGATIVE Final   Staphylococcus aureus POSITIVE (A) NEGATIVE Final    Comment: (NOTE) The Xpert SA Assay (FDA approved for NASAL specimens in patients 28 years of age and older), is one component of a  comprehensive surveillance program. It is not intended to diagnose infection nor to guide or monitor treatment. Performed at [facility], [place] [address] [place], [place] [address]     RADIOLOGY STUDIES/RESULTS: Mr Foot Right Wo Contrast  Result Date: 11/06/2017 CLINICAL DATA:  Diabetic patient with right foot pain and swelling. History of prior amputation. EXAM: MRI OF THE RIGHT FOREFOOT WITHOUT CONTRAST TECHNIQUE: Multiplanar, multisequence MR imaging of the right forefoot was performed. No intravenous contrast was administered. COMPARISON:  Plain films right foot earlier today. FINDINGS: Bones/Joint/Cartilage The patient is status post amputation at the level of the mid diaphysis of the third metatarsal. Abnormal increased T2 and decreased T1 signal are seen in the distal 4.5 cm of the first metatarsal and throughout the proximal phalanx of the great toe. Milder degree of edema is present in the distal phalanx of the great toe. Abnormal edema and decreased T1 signal are also seen throughout the second metatarsal and proximal phalanx of the second toe. Remnant of the third metatarsal demonstrates diffusely abnormal signal. Abnormal signal is also present throughout almost the entire fourth metatarsal with sparing of the head and neck identified. Bone marrow signal is otherwise normal. Ligaments Intact. Muscles and Tendons No intramuscular fluid collection is identified. There is some atrophy of intrinsic musculature the foot. Soft tissues Diffuse and intense soft tissue edema is present about the dorsum of the foot. Somewhat milder degree of edema is seen in the distal lower leg and in the plantar soft tissues. There appears to be a skin  ulceration on the plantar surface of the foot. IMPRESSION: Extensive abnormal signal in the first through fourth metatarsals and in the proximal phalanges of the first and second toes most consistent with osteomyelitis. Diffuse and intense subcutaneous edema about the foot and a milder degree of edema about the ankle most consistent with cellulitis. Negative for abscess or septic joint. Electronically Signed   By: Inge Rise M.D.   On: 11/06/2017 15:15   Dg Foot Complete Right  Result Date: 11/06/2017 CLINICAL DATA:  Diabetic patient with a wound on the right heel. EXAM: RIGHT FOOT COMPLETE - 3+ VIEW COMPARISON:  Plain films right foot 02/22/2017. FINDINGS: Since the prior examination, the patient has undergone amputation of the third metatarsal at the mid diaphysis. No bony destructive change or periosteal reaction is identified. Plantar calcaneal spur is noted. Small linear focus of superficial soft tissue gas is seen in the plantar soft tissues of the foot consistent with an ulceration. Severe and diffuse soft tissue edema is present about the foot. IMPRESSION: Severe, diffuse soft tissue edema about the right foot without plain film evidence of osteomyelitis. Small focus of linear gas in the superficial plantar soft tissues of the foot consistent with skin ulceration. Status post amputation at the level of the mid third metatarsal without complicating feature. Electronically Signed   By: Inge Rise M.D.   On: 11/06/2017 11:24     LOS: 3 days   Lala Lund, MD  [facility]  If 7PM-7AM, please contact night-coverage  Please page via [email or web address]-click on MD name and type text message  11/09/2017, 2:11 PM

## 2017-11-09 NOTE — Op Note (Signed)
11/09/2017  12:58 PM  PATIENT:  Gina [name]    PRE-OPERATIVE DIAGNOSIS:  Right Heel Abscess  POST-OPERATIVE DIAGNOSIS:  Same  PROCEDURE:  Debride Ulcer Right Heel  SURGEON:  [name], MD  PHYSICIAN ASSISTANT:None ANESTHESIA:   General  PREOPERATIVE INDICATIONS:  Gina [name] is a  57 y.o. female with a diagnosis of Right Heel Abscess who failed conservative measures and elected for surgical management.    The risks benefits and alternatives were discussed with the patient preoperatively including but not limited to the risks of infection, bleeding, nerve injury, cardiopulmonary complications, the need for revision surgery, among others, and the patient was willing to proceed.  OPERATIVE IMPLANTS: Wound packed open with Kerlix.  @ENCIMAGES @  OPERATIVE FINDINGS: Essentially the entire fat pad was necrotic abscess and nonviable.  OPERATIVE PROCEDURE: Patient was brought the operating room underwent general anesthetic.  After adequate levels of anesthesia were obtained patient's right lower extremity was prepped using DuraPrep draped in the sterile field a timeout was called.  A midline incision was made over the heel pad.  The entire heel pad was necrotic this was debrided back to viable margins with a tissue defect of 7 x 5 cm.  Wound was irrigated with normal saline tissue and abscess was sent for cultures.  The heel pad was excised of skin and soft tissue muscle and fascia sharply with a 21 blade knife and a rondure.  Patient had insufficient soft tissue for any type of soft tissue reconstruction even if a partial calcaneal excision would be considered.  The wound was packed open sterile dressing was applied.  Patient was extubated taken to PACU in stable condition  I will need to discuss with the patient the recommended treatment for a transtibial amputation with insufficient soft tissue to cover the heel pad.   DISCHARGE PLANNING:  Antibiotic duration: Continue IV  antibiotics adjust per tissue cultures  Weightbearing: Nonweightbearing on the right  Pain medication: Opioid pathway ordered  Dressing care/ Wound VAC: Reinforce dressing as needed  Ambulatory devices: Walker  Discharge to: Will need to discuss with the patient recommendation to proceed with a transtibial amputation.  Patient is insufficient soft tissue for a viable heel pad.  Anticipate patient will need discharge to skilled nursing after transtibial amputation.  Follow-up: In the office 1 week post operative.

## 2017-11-09 NOTE — Anesthesia Postprocedure Evaluation (Signed)
Anesthesia Post Note  Patient: Gina [name]  Procedure(s) Performed: Debride Ulcer Right Heel (Right )     Patient location during evaluation: PACU Anesthesia Type: General Level of consciousness: awake and alert Pain management: pain level controlled Vital Signs Assessment: post-procedure vital signs reviewed and stable Respiratory status: spontaneous breathing, nonlabored ventilation, respiratory function stable and patient connected to nasal cannula oxygen Cardiovascular status: blood pressure returned to baseline and stable Postop Assessment: no apparent nausea or vomiting Anesthetic complications: no    Last Vitals:  Vitals:   11/09/17 1345 11/09/17 1347  BP: (!) 129/96   Pulse: 79   Resp: 15   Temp:  36.8 C  SpO2: 98%     Last Pain:  Vitals:   11/09/17 1347  TempSrc:   PainSc: 0-No pain                 Taquila Leys S

## 2017-11-09 NOTE — Anesthesia Preprocedure Evaluation (Signed)
Anesthesia Evaluation  Patient identified by MRN, date of birth, ID band Patient awake    Reviewed: Allergy & Precautions, NPO status , Patient's Chart, lab work & pertinent test results  Airway Mallampati: II  TM Distance: >3 FB Neck ROM: Full    Dental no notable dental hx.    Pulmonary neg pulmonary ROS,    Pulmonary exam normal breath sounds clear to auscultation       Cardiovascular hypertension, Normal cardiovascular exam Rhythm:Regular Rate:Normal     Neuro/Psych Schizophrenia negative neurological ROS     GI/Hepatic negative GI ROS, Neg liver ROS,   Endo/Other  diabetes  Renal/GU negative Renal ROS  negative genitourinary   Musculoskeletal negative musculoskeletal ROS (+)   Abdominal   Peds negative pediatric ROS (+)  Hematology  (+) anemia ,   Anesthesia Other Findings   Reproductive/Obstetrics negative OB ROS                             Anesthesia Physical Anesthesia Plan  ASA: III  Anesthesia Plan: General   Post-op Pain Management:    Induction: Intravenous  PONV Risk Score and Plan: 3 and Ondansetron, Dexamethasone and Treatment may vary due to age or medical condition  Airway Management Planned: LMA  Additional Equipment:   Intra-op Plan:   Post-operative Plan: Extubation in OR  Informed Consent: I have reviewed the patients History and Physical, chart, labs and discussed the procedure including the risks, benefits and alternatives for the proposed anesthesia with the patient or authorized representative who has indicated his/her understanding and acceptance.   Dental advisory given  Plan Discussed with: CRNA and Surgeon  Anesthesia Plan Comments:         Anesthesia Quick Evaluation

## 2017-11-09 NOTE — Progress Notes (Signed)
Pt returned to room 5N11 after surgery. Received report from Arnette Norris, RN in PACU. See reassessment. Will continue to monitor.

## 2017-11-09 NOTE — Interval H&P Note (Signed)
History and Physical Interval Note:  11/09/2017 7:27 AM  Gina [name]  has presented today for surgery, with the diagnosis of Right Heel Abscess  The various methods of treatment have been discussed with the patient and family. After consideration of risks, benefits and other options for treatment, the patient has consented to  Procedure(s): Debride Ulcer Right Heel (Right) as a surgical intervention .  The patient's history has been reviewed, patient examined, no change in status, stable for surgery.  I have reviewed the patient's chart and labs.  Questions were answered to the patient's satisfaction.     [name]

## 2017-11-09 NOTE — Plan of Care (Signed)
Problem: Education: Goal: Knowledge of General Education information will improve Description Including pain rating scale, medication(s)/side effects and non-pharmacologic comfort measures Outcome: Progressing   Problem: Health Behavior/Discharge Planning: Goal: Ability to manage health-related needs will improve Outcome: Progressing   Problem: Clinical Measurements: Goal: Respiratory complications will improve Outcome: Progressing   Problem: Nutrition: Goal: Adequate nutrition will be maintained Outcome: Progressing   Problem: Elimination: Goal: Will not experience complications related to urinary retention Outcome: Progressing   Problem: Safety: Goal: Ability to remain free from injury will improve Outcome: Progressing

## 2017-11-09 NOTE — Progress Notes (Signed)
Patient c/o nausea CBG was checked to assess if patient was hypoglycemic CBG was 119 4mg  of Zofran IV given. Arthor Captain LPN

## 2017-11-10 ENCOUNTER — Encounter (HOSPITAL_COMMUNITY): Payer: Self-pay | Admitting: Orthopedic Surgery

## 2017-11-10 LAB — COMPREHENSIVE METABOLIC PANEL
ALT: 12 U/L (ref 0–44)
AST: 20 U/L (ref 15–41)
Albumin: 1.7 g/dL — ABNORMAL LOW (ref 3.5–5.0)
Alkaline Phosphatase: 64 U/L (ref 38–126)
Anion gap: 7 (ref 5–15)
BUN: 7 mg/dL (ref 6–20)
CO2: 27 mmol/L (ref 22–32)
Calcium: 8 mg/dL — ABNORMAL LOW (ref 8.9–10.3)
Chloride: 100 mmol/L (ref 98–111)
Creatinine, Ser: 0.59 mg/dL (ref 0.44–1.00)
GFR calc Af Amer: >60 mL/min (ref >60)
GFR calc non Af Amer: >60 mL/min (ref >60)
Glucose, Bld: 72 mg/dL (ref 70–99)
POTASSIUM: 4.3 mmol/L (ref 3.5–5.1)
Sodium: 134 mmol/L — ABNORMAL LOW (ref 135–145)
TOTAL PROTEIN: 5.6 g/dL — AB (ref 6.5–8.1)
Total Bilirubin: 0.8 mg/dL (ref 0.3–1.2)

## 2017-11-10 LAB — CBC
HEMATOCRIT: 28 % — AB (ref 36.0–46.0)
Hemoglobin: 8.8 g/dL — ABNORMAL LOW (ref 12.0–15.0)
MCH: 29.5 pg (ref 26.0–34.0)
MCHC: 31.4 g/dL (ref 30.0–36.0)
MCV: 94 fL (ref 78.0–100.0)
Platelets: 364 10*3/uL (ref 150–400)
RBC: 2.98 MIL/uL — ABNORMAL LOW (ref 3.87–5.11)
RDW: 13.1 % (ref 11.5–15.5)
WBC: 18.4 10*3/uL — ABNORMAL HIGH (ref 4.0–10.5)

## 2017-11-10 LAB — GLUCOSE, CAPILLARY
GLUCOSE-CAPILLARY: 102 mg/dL — AB (ref 70–99)
Glucose-Capillary: 152 mg/dL — ABNORMAL HIGH (ref 70–99)
Glucose-Capillary: 238 mg/dL — ABNORMAL HIGH (ref 70–99)
Glucose-Capillary: 259 mg/dL — ABNORMAL HIGH (ref 70–99)

## 2017-11-10 LAB — VANCOMYCIN, TROUGH: VANCOMYCIN TR: 8 ug/mL — AB (ref 15–20)

## 2017-11-10 MED ORDER — CEFAZOLIN SODIUM-DEXTROSE 2-4 GM/100ML-% IV SOLN
2.0000 g | INTRAVENOUS | Status: AC
  Administered 2017-11-11: 2 g via INTRAVENOUS
  Filled 2017-11-10 (×2): qty 100

## 2017-11-10 MED ORDER — VANCOMYCIN HCL IN DEXTROSE 1-5 GM/200ML-% IV SOLN
1000.0000 mg | Freq: Two times a day (BID) | INTRAVENOUS | Status: DC
  Administered 2017-11-11 (×3): 1000 mg via INTRAVENOUS
  Filled 2017-11-10 (×4): qty 200

## 2017-11-10 MED ORDER — POVIDONE-IODINE 10 % EX SWAB: 2.0000 "application " | Freq: Once | CUTANEOUS | Status: DC

## 2017-11-10 MED ORDER — CHLORHEXIDINE GLUCONATE 4 % EX LIQD: 60.0000 mL | Freq: Once | CUTANEOUS | Status: DC

## 2017-11-10 MED ORDER — LACTATED RINGERS IV SOLN
INTRAVENOUS | Status: DC
  Administered 2017-11-10: 12:00:00 via INTRAVENOUS
  Administered 2017-11-11: 75 mL/h via INTRAVENOUS

## 2017-11-10 MED ORDER — INSULIN GLARGINE 100 UNIT/ML ~~LOC~~ SOLN
12.0000 [IU] | Freq: Every day | SUBCUTANEOUS | Status: DC
  Administered 2017-11-10: 12 [IU] via SUBCUTANEOUS
  Filled 2017-11-10 (×2): qty 0.12

## 2017-11-10 NOTE — Progress Notes (Signed)
Patient ID: Gina [name], female   DOB: 1960/04/14, 57 y.o.   MRN: [id number] Patient's infection of the right heel showed complete destruction of the heel pad.  Patient has no foot salvage reconstruction options.  I have discussed this with the patient this morning as well as with her sitter.  Plan for a right transtibial amputation tomorrow morning Sunday.  Patient states she has a sitter 7 days a week but I am not sure if she would be safe for discharge to home.  We will have therapy evaluate her after surgery for discharge to home with around-the-clock care.  Patient may require discharge to skilled nursing.  Patient has worked with Museum/gallery curator for her spacer for the left shoe.

## 2017-11-10 NOTE — Progress Notes (Signed)
PT Cancellation Note  Patient Details Name: Gina [name] MRN: [id number] DOB: Feb 19, 1961   Cancelled Treatment:    Reason Eval/Treat Not Completed: Other (comment) Orders received. New orders from MD Candiss Norse for PT evaluation tomorrow (9/1). Per chart review, plan for BKA tomorrow.  Ellamae Sia, PT, DPT Acute Rehabilitation Services  Pager: [place] 11/10/2017, 2:16 PM

## 2017-11-10 NOTE — Plan of Care (Signed)
  Problem: Pain Managment: Goal: General experience of comfort will improve Outcome: Progressing   Problem: Skin Integrity: Goal: Risk for impaired skin integrity will decrease Outcome: Progressing   

## 2017-11-10 NOTE — Progress Notes (Signed)
Pharmacy Antibiotic Note  Gina [name] is a 57 y.o. female admitted on 11/06/2017 with sepsis.  Pharmacy has been consulted for vanc/cefepime dosing. Note patient hx DM s/p previous amputations with cellulitis and abscess of right foot that failed outpatient doxycycline.   Vancomycin trough low at 8 on 1250 mg IV every 24 hours.   WBC up 18.4. T max 100.5.   Plan: Increase Vancomycin 1000 mg IV every 12 hours.  Continue Cefepime 2g IV every 8 hours.  Monitor renal function, culture results, and clinical status.   Height: 5\' 2"  (157.5 cm) Weight: 160 lb 15 oz (73 kg) IBW/kg (Calculated) : 50.1  Temp (24hrs), Avg:99 F (37.2 C), Min:98.4 F (36.9 C), Max:100.5 F (38.1 C)  Recent Labs  Lab 11/06/17 1058 11/06/17 1109 11/07/17 0602 11/08/17 0734 11/09/17 0435 11/10/17 0446 11/10/17 1119  WBC 20.4*  --  16.7* 16.9* 18.5* 18.4*  --   CREATININE 0.63  --  0.59 0.63 0.56 0.59  --   LATICACIDVEN  --  1.20  --   --   --   --   --   VANCOTROUGH  --   --   --   --   --   --  8*    Estimated Creatinine Clearance: 73.5 mL/min (by C-G formula based on SCr of 0.59 mg/dL).    Allergies  Allergen Reactions  . Metformin And Related Other (See Comments)    Upset stomach    Thank you for allowing pharmacy to be a part of this patient's care.  Sloan Leiter, PharmD, BCPS, BCCCP Clinical Pharmacist Clinical phone 11/10/2017 until 3:[phone] Please refer to [facility] for [place] numbers 11/10/2017 1:50 PM

## 2017-11-10 NOTE — Progress Notes (Signed)
PROGRESS NOTE        PATIENT DETAILS Name: Gina [name] Age: 57 y.o. Sex: female Date of Birth: 10/11/1960 Admit Date: 11/06/2017 Admitting Physician [name], MD WYO:[id number], Maylon Peppers, FNP  Brief Narrative: Patient is a 57 y.o. 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  Evaluated by Dr. Sharol Given, plan is for irrigation and debridement on 8/30.  Subjective:  Patient in bed, appears comfortable, denies any headache, no fever, no chest pain or pressure, no shortness of breath , no abdominal pain. No focal weakness.  Assessment/Plan: Right diabetic foot with osteomyelitis and heel abscess: Continue empiric vancomycin cefepime and Flagyl.  Dr. Sharol Given following-with plans for now right BKA.  She is afebrile, nontoxic-appearing-leukocytosis has improved compared to admission.  Blood cultures on 8/27- so far.  Wound cultures preliminary growing both gram-positive cocci and gram-negative rods, she is currently on vancomycin I have added Rocephin IV on 11/10/2017.  Follow final wound care culture results.  Insulin-dependent DM-2: CBGs stable-mild hypoglycemia this morning, since she will be n.p.o. again I have dropped Lantus dose to 12 units daily along with sliding scale, hold nighttime sliding scale as she will be n.p.o. on 11/10/2017 night.  CBG (last 3)  Recent Labs    11/09/17 1625 11/09/17 2125 11/10/17 0843  GLUCAP 98 76 102*     Hypertension: Controlled, continue lisinopril  Normocytic anemia: Probably related to chronic disease-hemoglobin stable for close follow-up.  On iron supplementation.  Peripheral neuropathy: Probably related to diabetes-continue Neurontin  Schizophrenia: Appears to be stable, continue Lexapro   DVT Prophylaxis: Prophylactic Lovenox  Code Status: Full code     Family Communication: None at bedside  Disposition Plan: Remain inpatient-requires several more days of hospitalization-probably home health services on discharge.  Antimicrobial agents: Anti-infectives (From admission, onward)   Start     Dose/Rate Route Frequency Ordered Stop   11/09/17 0600  ceFAZolin (ANCEF) IVPB 2g/100 mL premix     2 g 200 mL/hr over 30 Minutes Intravenous To [facility] 11/08/17 1934 11/09/17 1233   11/07/17 1200  vancomycin (VANCOCIN) 1,250 mg in sodium chloride 0.9 % 250 mL IVPB     1,250 mg 166.7 mL/hr over 90 Minutes Intravenous Every 24 hours 11/06/17 1345     11/07/17 1000  metroNIDAZOLE (FLAGYL) IVPB 500 mg     500 mg 100 mL/hr over 60 Minutes Intravenous Every 8 hours 11/07/17 0932     11/06/17 2000  ceFEPIme (MAXIPIME) 2 g in sodium chloride 0.9 % 100 mL IVPB     2 g 200 mL/hr over 30 Minutes Intravenous Every 8 hours 11/06/17 1345     11/06/17 1200  vancomycin (VANCOCIN) 1,500 mg in sodium chloride 0.9 % 500 mL IVPB     1,500 mg 250 mL/hr over 120 Minutes Intravenous  Once 11/06/17 1127 11/07/17 1900   11/06/17 1130  ceFEPIme (MAXIPIME) 2 g in sodium chloride 0.9 % 100 mL IVPB     2 g 200 mL/hr over 30 Minutes Intravenous  Once 11/06/17 1127 11/06/17 1258      Procedures: None  CONSULTS:  orthopedic surgery  Time spent: 25- minutes-Greater than 50% of this time was spent in counseling, explanation of diagnosis, planning of further management, and coordination of care.  MEDICATIONS: Scheduled Meds: . Chlorhexidine Gluconate Cloth  6 each Topical Daily  . docusate sodium  100 mg Oral BID  . enoxaparin (LOVENOX) injection  40 mg Subcutaneous Q24H  . escitalopram  20 mg Oral Daily  . ferrous sulfate  325 mg Oral BID WC  . gabapentin  300 mg Oral QHS  . insulin aspart  0-15 Units Subcutaneous TID WC  . insulin glargine  12 Units Subcutaneous Q2200  . lisinopril  5 mg Oral Daily  . mupirocin ointment  1 application Nasal BID  .  sodium chloride flush  3 mL Intravenous Q12H   Continuous Infusions: . sodium chloride Stopped (11/07/17 1243)  . ceFEPime (MAXIPIME) IV 2 g (11/10/17 0554)  . [START ON 11/11/2017] lactated ringers    . methocarbamol (ROBAXIN) IV    . metronidazole 500 mg (11/10/17 0630)  . vancomycin 166.7 mL/hr at 11/09/17 1800   PRN Meds:.sodium chloride, acetaminophen **OR** acetaminophen, bisacodyl, hydrALAZINE, HYDROmorphone (DILAUDID) injection, magnesium citrate, methocarbamol **OR** methocarbamol (ROBAXIN) IV, metoCLOPramide **OR** metoCLOPramide (REGLAN) injection, morphine injection, ondansetron **OR** ondansetron (ZOFRAN) IV, ondansetron **OR** ondansetron (ZOFRAN) IV, oxyCODONE, oxyCODONE, polyethylene glycol, sodium chloride flush, traMADol   PHYSICAL EXAM: Vital signs: Vitals:   11/09/17 1410 11/09/17 2123 11/10/17 0015 11/10/17 0432  BP: 135/64 134/72 (!) 160/78 (!) 141/67  Pulse: 80 75 75 72  Resp:      Temp: 98.8 F (37.1 C) 98.7 F (37.1 C) 98.4 F (36.9 C) 98.7 F (37.1 C)  TempSrc:  Oral Oral   SpO2: 99% 98% 95% 93%  Weight:      Height:       Filed Weights   11/06/17 1002 11/09/17 0958  Weight: 73.5 kg 73 kg   Body mass index is 29.44 kg/m.   Exam  Awake Alert, Oriented X 3, No new F.N deficits, Normal affect [place].AT,PERRAL Supple Neck,No JVD, No cervical lymphadenopathy appriciated.  Symmetrical Chest wall movement, Good air movement bilaterally, CTAB RRR,No Gallops, Rubs or new Murmurs, No Parasternal Heave +ve B.Sounds, Abd Soft, No tenderness, No organomegaly appriciated, No rebound - guarding or rigidity. No Cyanosis, Clubbing or edema, old left transmetatarsal amputation, right foot swollen especially around the ankle and under bandage   I have personally reviewed following labs and imaging studies  LABORATORY DATA: CBC: Recent Labs  Lab 11/06/17 1058 11/07/17 0602 11/08/17 0734 11/09/17 0435 11/10/17 0446  WBC 20.4* 16.7* 16.9* 18.5* 18.4*    NEUTROABS 16.4*  --   --   --   --   HGB 10.0* 9.9* 9.6* 10.0* 8.8*  HCT 30.4* 30.6* 29.9* 31.9* 28.0*  MCV 90.7 91.9 92.9 93.3 94.0  PLT 338 352 306 368 102    Basic Metabolic Panel: Recent Labs  Lab 11/06/17 1058 11/07/17 0602 11/08/17 0734 11/09/17 0435 11/10/17 0446  NA 134* 135 134* 131* 134*  K 4.3 4.7 4.4 4.0 4.3  CL 96* 100 99 99 100  CO2 28 28 29 26 27   GLUCOSE 464* 120* 80 105* 72  BUN 12 13 10 8 7   CREATININE 0.63 0.59 0.63 0.56 0.59  CALCIUM 8.9 8.6* 8.2* 8.1* 8.0*    GFR: Estimated Creatinine Clearance: 73.5 mL/min (by C-G formula based on SCr of 0.59 mg/dL).  Liver Function Tests: Recent Labs  Lab 11/06/17 1058 11/10/17 0446  AST 17 20  ALT 16 12  ALKPHOS 84 64  BILITOT 0.4 0.8  PROT 7.4 5.6*  ALBUMIN 2.7* 1.7*   No results for input(s): LIPASE, AMYLASE in the last  168 hours. No results for input(s): AMMONIA in the last 168 hours.  Coagulation Profile: No results for input(s): INR, PROTIME in the last 168 hours.  Cardiac Enzymes: No results for input(s): CKTOTAL, CKMB, CKMBINDEX, TROPONINI in the last 168 hours.  BNP (last 3 results) No results for input(s): PROBNP in the last 8760 hours.  HbA1C: No results for input(s): HGBA1C in the last 72 hours.  CBG: Recent Labs  Lab 11/09/17 1303 11/09/17 1416 11/09/17 1625 11/09/17 2125 11/10/17 0843  GLUCAP 81 84 98 76 102*    Lipid Profile: No results for input(s): CHOL, HDL, LDLCALC, TRIG, CHOLHDL, LDLDIRECT in the last 72 hours.  Thyroid Function Tests: No results for input(s): TSH, T4TOTAL, FREET4, T3FREE, THYROIDAB in the last 72 hours.  Anemia Panel: No results for input(s): VITAMINB12, FOLATE, FERRITIN, TIBC, IRON, RETICCTPCT in the last 72 hours.  Urine analysis:    Component Value Date/Time   COLORURINE YELLOW 11/09/2017 0929   APPEARANCEUR HAZY (A) 11/09/2017 0929   LABSPEC 1.013 11/09/2017 0929   PHURINE 6.0 11/09/2017 0929   GLUCOSEU NEGATIVE 11/09/2017 0929   HGBUR  LARGE (A) 11/09/2017 0929   BILIRUBINUR NEGATIVE 11/09/2017 0929   BILIRUBINUR N 04/28/2016 1603   KETONESUR NEGATIVE 11/09/2017 0929   PROTEINUR 30 (A) 11/09/2017 0929   UROBILINOGEN 0.2 04/28/2016 1603   UROBILINOGEN 0.2 06/18/2013 0650   NITRITE NEGATIVE 11/09/2017 0929   LEUKOCYTESUR NEGATIVE 11/09/2017 0929    Sepsis Labs: Lactic Acid, Venous    Component Value Date/Time   LATICACIDVEN 1.20 11/06/2017 1109    MICROBIOLOGY: Recent Results (from the past 240 hour(s))  Culture, blood (routine x 2)     Status: None (Preliminary result)   Collection Time: 11/06/17 10:23 AM  Result Value Ref Range Status   Specimen Description   Final    BLOOD LEFT ANTECUBITAL Performed at [facility], [place] [address], [place], [place] [address]    Special Requests   Final    BOTTLES DRAWN AEROBIC AND ANAEROBIC Blood Culture results may not be optimal due to an inadequate volume of blood received in culture bottles Performed at [facility] [address] [place], [place] [address]    Culture   Final    NO GROWTH 3 DAYS Performed at [facility], [place] [address] [place], [place] [address]    Report Status PENDING  Incomplete  Culture, blood (routine x 2)     Status: None (Preliminary result)   Collection Time: 11/06/17 10:28 AM  Result Value Ref Range Status   Specimen Description   Final    BLOOD RIGHT HAND Performed at [facility] [address] [place], [place] [address]    Special Requests   Final    BOTTLES DRAWN AEROBIC AND ANAEROBIC Blood Culture results may not be optimal due to an inadequate volume of blood received in culture bottles Performed at [facility] [address] [place], [place] [address]    Culture   Final    NO GROWTH 3 DAYS Performed at [facility], [place] [address] [place], [place] [address]    Report Status PENDING  Incomplete  Surgical pcr screen     Status:  Abnormal   Collection Time: 11/07/17  6:55 PM  Result Value Ref Range Status   MRSA, PCR NEGATIVE NEGATIVE Final   Staphylococcus aureus POSITIVE (A) NEGATIVE Final    Comment: (NOTE) The Xpert SA Assay (FDA approved for NASAL specimens  in patients 38 years of age and older), is one component of a comprehensive surveillance program. It is not intended to diagnose infection nor to guide or monitor treatment. Performed at [facility], [place] [address], [place], [place] [address]   Aerobic/Anaerobic Culture (surgical/deep wound)     Status: None (Preliminary result)   Collection Time: 11/09/17 12:48 PM  Result Value Ref Range Status   Specimen Description TISSUE RIGHT FOOT HEEL  Final   Special Requests SPEC IN SWABS  Final   Gram Stain   Final    ABUNDANT WBC PRESENT,BOTH PMN AND MONONUCLEAR ABUNDANT GRAM POSITIVE COCCI FEW GRAM NEGATIVE RODS Performed at [facility], [place] [address], [place],  [address]    Culture PENDING  Incomplete   Report Status PENDING  Incomplete    RADIOLOGY STUDIES/RESULTS: Mr Foot Right Wo Contrast  Result Date: 11/06/2017 CLINICAL DATA:  Diabetic patient with right foot pain and swelling. History of prior amputation. EXAM: MRI OF THE RIGHT FOREFOOT WITHOUT CONTRAST TECHNIQUE: Multiplanar, multisequence MR imaging of the right forefoot was performed. No intravenous contrast was administered. COMPARISON:  Plain films right foot earlier today. FINDINGS: Bones/Joint/Cartilage The patient is status post amputation at the level of the mid diaphysis of the third metatarsal. Abnormal increased T2 and decreased T1 signal are seen in the distal 4.5 cm of the first metatarsal and throughout the proximal phalanx of the great toe. Milder degree of edema is present in the distal phalanx of the great toe. Abnormal edema and decreased T1 signal are also seen throughout the second metatarsal and proximal phalanx of the second toe. Remnant of the third  metatarsal demonstrates diffusely abnormal signal. Abnormal signal is also present throughout almost the entire fourth metatarsal with sparing of the head and neck identified. Bone marrow signal is otherwise normal. Ligaments Intact. Muscles and Tendons No intramuscular fluid collection is identified. There is some atrophy of intrinsic musculature the foot. Soft tissues Diffuse and intense soft tissue edema is present about the dorsum of the foot. Somewhat milder degree of edema is seen in the distal lower leg and in the plantar soft tissues. There appears to be a skin ulceration on the plantar surface of the foot. IMPRESSION: Extensive abnormal signal in the first through fourth metatarsals and in the proximal phalanges of the first and second toes most consistent with osteomyelitis. Diffuse and intense subcutaneous edema about the foot and a milder degree of edema about the ankle most consistent with cellulitis. Negative for abscess or septic joint. Electronically Signed   By: Inge Rise M.D.   On: 11/06/2017 15:15   Dg Foot Complete Right  Result Date: 11/06/2017 CLINICAL DATA:  Diabetic patient with a wound on the right heel. EXAM: RIGHT FOOT COMPLETE - 3+ VIEW COMPARISON:  Plain films right foot 02/22/2017. FINDINGS: Since the prior examination, the patient has undergone amputation of the third metatarsal at the mid diaphysis. No bony destructive change or periosteal reaction is identified. Plantar calcaneal spur is noted. Small linear focus of superficial soft tissue gas is seen in the plantar soft tissues of the foot consistent with an ulceration. Severe and diffuse soft tissue edema is present about the foot. IMPRESSION: Severe, diffuse soft tissue edema about the right foot without plain film evidence of osteomyelitis. Small focus of linear gas in the superficial plantar soft tissues of the foot consistent with skin ulceration. Status post amputation at the level of the mid third metatarsal  without complicating feature. Electronically  Signed   By: Inge Rise M.D.   On: 11/06/2017 11:24     LOS: 4 days   Lala Lund, MD  [facility]  If 7PM-7AM, please contact night-coverage  Please page via [email or web address] [email or web address]-click on MD name and type text message  11/10/2017, 11:22 AM

## 2017-11-10 NOTE — H&P (View-Only) (Signed)
Patient ID: Gina [name], female   DOB: 1960/09/10, 57 y.o.   MRN: [id number] Patient's infection of the right heel showed complete destruction of the heel pad.  Patient has no foot salvage reconstruction options.  I have discussed this with the patient this morning as well as with her sitter.  Plan for a right transtibial amputation tomorrow morning Sunday.  Patient states she has a sitter 7 days a week but I am not sure if she would be safe for discharge to home.  We will have therapy evaluate her after surgery for discharge to home with around-the-clock care.  Patient may require discharge to skilled nursing.  Patient has worked with Museum/gallery curator for her spacer for the left shoe.

## 2017-11-11 ENCOUNTER — Inpatient Hospital Stay (HOSPITAL_COMMUNITY): Payer: Medicaid Other | Admitting: Certified Registered Nurse Anesthetist

## 2017-11-11 ENCOUNTER — Encounter (HOSPITAL_COMMUNITY)
Admission: EM | Disposition: A | Discharge status: Skilled Nursing Facility | Payer: Self-pay | Source: Home / Self Care | Attending: Internal Medicine

## 2017-11-11 ENCOUNTER — Encounter (HOSPITAL_COMMUNITY): Payer: Self-pay

## 2017-11-11 HISTORY — PX: AMPUTATION: SHX166

## 2017-11-11 LAB — GLUCOSE, CAPILLARY
GLUCOSE-CAPILLARY: 221 mg/dL — AB (ref 70–99)
GLUCOSE-CAPILLARY: 171 mg/dL — AB (ref 70–99)
GLUCOSE-CAPILLARY: 254 mg/dL — AB (ref 70–99)
Glucose-Capillary: 212 mg/dL — ABNORMAL HIGH (ref 70–99)
Glucose-Capillary: 231 mg/dL — ABNORMAL HIGH (ref 70–99)

## 2017-11-11 LAB — CBC
HEMATOCRIT: 29.3 % — AB (ref 36.0–46.0)
HEMOGLOBIN: 9.1 g/dL — AB (ref 12.0–15.0)
MCH: 28.9 pg (ref 26.0–34.0)
MCHC: 31.1 g/dL (ref 30.0–36.0)
MCV: 93 fL (ref 78.0–100.0)
Platelets: 402 10*3/uL — ABNORMAL HIGH (ref 150–400)
RBC: 3.15 MIL/uL — ABNORMAL LOW (ref 3.87–5.11)
RDW: 13.3 % (ref 11.5–15.5)
WBC: 14.7 10*3/uL — ABNORMAL HIGH (ref 4.0–10.5)

## 2017-11-11 LAB — CULTURE, BLOOD (ROUTINE X 2)
Culture: NO GROWTH
Culture: NO GROWTH

## 2017-11-11 LAB — BASIC METABOLIC PANEL
Anion gap: 6 (ref 5–15)
BUN: 11 mg/dL (ref 6–20)
CHLORIDE: 102 mmol/L (ref 98–111)
CO2: 25 mmol/L (ref 22–32)
Calcium: 7.8 mg/dL — ABNORMAL LOW (ref 8.9–10.3)
Creatinine, Ser: 0.61 mg/dL (ref 0.44–1.00)
GFR calc Af Amer: >60 mL/min (ref >60)
GFR calc non Af Amer: >60 mL/min (ref >60)
GLUCOSE: 261 mg/dL — AB (ref 70–99)
Potassium: 4.5 mmol/L (ref 3.5–5.1)
Sodium: 133 mmol/L — ABNORMAL LOW (ref 135–145)

## 2017-11-11 SURGERY — AMPUTATION BELOW KNEE
Anesthesia: Monitor Anesthesia Care | Site: Leg Lower | Laterality: Right

## 2017-11-11 MED ORDER — SODIUM CHLORIDE 0.9 % IV SOLN: INTRAVENOUS | Status: DC

## 2017-11-11 MED ORDER — FENTANYL CITRATE (PF) 100 MCG/2ML IJ SOLN
INTRAMUSCULAR | Status: AC
  Administered 2017-11-11: 50 ug via INTRAVENOUS
  Filled 2017-11-11: qty 2

## 2017-11-11 MED ORDER — MIDAZOLAM HCL 2 MG/2ML IJ SOLN
1.0000 mg | Freq: Once | INTRAMUSCULAR | Status: AC
  Administered 2017-11-11: 1 mg via INTRAVENOUS
  Filled 2017-11-11: qty 1

## 2017-11-11 MED ORDER — LIDOCAINE 2% (20 MG/ML) 5 ML SYRINGE
INTRAMUSCULAR | Status: DC | PRN
  Administered 2017-11-11: 60 mg via INTRAVENOUS

## 2017-11-11 MED ORDER — LACTATED RINGERS IV SOLN: INTRAVENOUS | Status: DC

## 2017-11-11 MED ORDER — 0.9 % SODIUM CHLORIDE (POUR BTL) OPTIME
TOPICAL | Status: DC | PRN
  Administered 2017-11-11: 1000 mL

## 2017-11-11 MED ORDER — POLYETHYLENE GLYCOL 3350 17 G PO PACK: 17.0000 g | PACK | Freq: Every day | ORAL | Status: DC | PRN

## 2017-11-11 MED ORDER — INSULIN GLARGINE 100 UNIT/ML ~~LOC~~ SOLN
15.0000 [IU] | Freq: Every day | SUBCUTANEOUS | Status: DC
  Administered 2017-11-11 (×2): 15 [IU] via SUBCUTANEOUS
  Filled 2017-11-11 (×3): qty 0.15

## 2017-11-11 MED ORDER — METHOCARBAMOL 1000 MG/10ML IJ SOLN: 500.0000 mg | Freq: Four times a day (QID) | INTRAVENOUS | Status: DC | PRN

## 2017-11-11 MED ORDER — METHOCARBAMOL 500 MG PO TABS
500.0000 mg | ORAL_TABLET | Freq: Four times a day (QID) | ORAL | Status: DC | PRN
  Administered 2017-11-11 – 2017-11-12 (×3): 500 mg via ORAL
  Filled 2017-11-11 (×2): qty 1

## 2017-11-11 MED ORDER — ROPIVACAINE HCL 7.5 MG/ML IJ SOLN
INTRAMUSCULAR | Status: DC | PRN
  Administered 2017-11-11 (×2): 10 mL via PERINEURAL

## 2017-11-11 MED ORDER — FENTANYL CITRATE (PF) 100 MCG/2ML IJ SOLN
50.0000 ug | Freq: Once | INTRAMUSCULAR | Status: AC
  Administered 2017-11-11: 50 ug via INTRAVENOUS
  Filled 2017-11-11: qty 1

## 2017-11-11 MED ORDER — MIDAZOLAM HCL 2 MG/2ML IJ SOLN
INTRAMUSCULAR | Status: AC
  Filled 2017-11-11: qty 2

## 2017-11-11 MED ORDER — INSULIN ASPART 100 UNIT/ML ~~LOC~~ SOLN
SUBCUTANEOUS | Status: AC
  Administered 2017-11-11: 5 [IU] via SUBCUTANEOUS
  Filled 2017-11-11: qty 1

## 2017-11-11 MED ORDER — HYDROMORPHONE HCL 1 MG/ML IJ SOLN
0.5000 mg | INTRAMUSCULAR | Status: DC | PRN
  Administered 2017-11-12: 0.5 mg via INTRAVENOUS
  Filled 2017-11-11: qty 1

## 2017-11-11 MED ORDER — MIDAZOLAM HCL 2 MG/2ML IJ SOLN
INTRAMUSCULAR | Status: AC
  Administered 2017-11-11: 1 mg via INTRAVENOUS
  Filled 2017-11-11: qty 2

## 2017-11-11 MED ORDER — METOCLOPRAMIDE HCL 5 MG PO TABS: 5.0000 mg | ORAL_TABLET | Freq: Three times a day (TID) | ORAL | Status: DC | PRN

## 2017-11-11 MED ORDER — MAGNESIUM CITRATE PO SOLN: 1.0000 | Freq: Once | ORAL | Status: DC | PRN

## 2017-11-11 MED ORDER — GABAPENTIN 300 MG PO CAPS
300.0000 mg | ORAL_CAPSULE | Freq: Three times a day (TID) | ORAL | Status: DC
  Administered 2017-11-11 – 2017-11-16 (×12): 300 mg via ORAL
  Filled 2017-11-11 (×11): qty 1

## 2017-11-11 MED ORDER — OXYCODONE HCL 5 MG PO TABS: 5.0000 mg | ORAL_TABLET | ORAL | Status: DC | PRN

## 2017-11-11 MED ORDER — ONDANSETRON HCL 4 MG PO TABS: 4.0000 mg | ORAL_TABLET | Freq: Four times a day (QID) | ORAL | Status: DC | PRN

## 2017-11-11 MED ORDER — ACETAMINOPHEN 325 MG PO TABS: 325.0000 mg | ORAL_TABLET | Freq: Four times a day (QID) | ORAL | Status: DC | PRN

## 2017-11-11 MED ORDER — INSULIN ASPART 100 UNIT/ML ~~LOC~~ SOLN
0.0000 [IU] | Freq: Three times a day (TID) | SUBCUTANEOUS | Status: DC
  Administered 2017-11-11 – 2017-11-12 (×2): 3 [IU] via SUBCUTANEOUS
  Administered 2017-11-12: 5 [IU] via SUBCUTANEOUS
  Administered 2017-11-12 – 2017-11-13 (×2): 2 [IU] via SUBCUTANEOUS
  Administered 2017-11-13: 1 [IU] via SUBCUTANEOUS
  Administered 2017-11-13: 2 [IU] via SUBCUTANEOUS
  Administered 2017-11-14: 1 [IU] via SUBCUTANEOUS
  Administered 2017-11-14: 4 [IU] via SUBCUTANEOUS
  Administered 2017-11-14: 2 [IU] via SUBCUTANEOUS
  Administered 2017-11-15: 1 [IU] via SUBCUTANEOUS
  Administered 2017-11-15: 2 [IU] via SUBCUTANEOUS
  Administered 2017-11-15: 1 [IU] via SUBCUTANEOUS

## 2017-11-11 MED ORDER — DEXAMETHASONE SODIUM PHOSPHATE 10 MG/ML IJ SOLN
INTRAMUSCULAR | Status: AC
  Filled 2017-11-11: qty 2

## 2017-11-11 MED ORDER — BISACODYL 10 MG RE SUPP: 10.0000 mg | Freq: Every day | RECTAL | Status: DC | PRN

## 2017-11-11 MED ORDER — METOCLOPRAMIDE HCL 5 MG/ML IJ SOLN: 5.0000 mg | Freq: Three times a day (TID) | INTRAMUSCULAR | Status: DC | PRN

## 2017-11-11 MED ORDER — METHOCARBAMOL 500 MG PO TABS
ORAL_TABLET | ORAL | Status: AC
  Filled 2017-11-11: qty 1

## 2017-11-11 MED ORDER — OXYCODONE HCL 5 MG PO TABS: 10.0000 mg | ORAL_TABLET | ORAL | Status: DC | PRN

## 2017-11-11 MED ORDER — PROPOFOL 500 MG/50ML IV EMUL
INTRAVENOUS | Status: DC | PRN
  Administered 2017-11-11: 75 ug/kg/min via INTRAVENOUS

## 2017-11-11 MED ORDER — PROPOFOL 10 MG/ML IV BOLUS
INTRAVENOUS | Status: AC
  Filled 2017-11-11: qty 20

## 2017-11-11 MED ORDER — DOCUSATE SODIUM 100 MG PO CAPS
100.0000 mg | ORAL_CAPSULE | Freq: Two times a day (BID) | ORAL | Status: DC
  Administered 2017-11-12 (×2): 100 mg via ORAL
  Filled 2017-11-11 (×2): qty 1

## 2017-11-11 MED ORDER — INSULIN ASPART 100 UNIT/ML ~~LOC~~ SOLN
0.0000 [IU] | SUBCUTANEOUS | Status: DC
  Administered 2017-11-11: 5 [IU] via SUBCUTANEOUS

## 2017-11-11 MED ORDER — BUPIVACAINE-EPINEPHRINE (PF) 0.5% -1:200000 IJ SOLN
INTRAMUSCULAR | Status: DC | PRN
  Administered 2017-11-11: 10 mL via PERINEURAL
  Administered 2017-11-11: 20 mL via PERINEURAL

## 2017-11-11 MED ORDER — INSULIN ASPART 100 UNIT/ML ~~LOC~~ SOLN
0.0000 [IU] | Freq: Every day | SUBCUTANEOUS | Status: DC
  Administered 2017-11-11: 3 [IU] via SUBCUTANEOUS
  Administered 2017-11-12: 2 [IU] via SUBCUTANEOUS
  Administered 2017-11-13: 4 [IU] via SUBCUTANEOUS
  Administered 2017-11-15: 2 [IU] via SUBCUTANEOUS

## 2017-11-11 MED ORDER — ONDANSETRON HCL 4 MG/2ML IJ SOLN: 4.0000 mg | Freq: Four times a day (QID) | INTRAMUSCULAR | Status: DC | PRN

## 2017-11-11 MED ORDER — FENTANYL CITRATE (PF) 250 MCG/5ML IJ SOLN
INTRAMUSCULAR | Status: AC
  Filled 2017-11-11: qty 5

## 2017-11-11 MED ORDER — ONDANSETRON HCL 4 MG/2ML IJ SOLN
INTRAMUSCULAR | Status: AC
  Filled 2017-11-11: qty 4

## 2017-11-11 SURGICAL SUPPLY — 38 items
BENZOIN TINCTURE PRP APPL 2/3 (GAUZE/BANDAGES/DRESSINGS) ×6 IMPLANT
BLADE SAW RECIP 87.9 MT (BLADE) ×3 IMPLANT
BLADE SURG 21 STRL SS (BLADE) ×3 IMPLANT
BNDG COHESIVE 6X5 TAN STRL LF (GAUZE/BANDAGES/DRESSINGS) ×6 IMPLANT
BNDG GAUZE ELAST 4 BULKY (GAUZE/BANDAGES/DRESSINGS) ×6 IMPLANT
CANISTER WOUNDNEG PRESSURE 500 (CANNISTER) ×2 IMPLANT
COVER SURGICAL LIGHT HANDLE (MISCELLANEOUS) ×3 IMPLANT
CUFF TOURNIQUET SINGLE 34IN LL (TOURNIQUET CUFF) IMPLANT
CUFF TOURNIQUET SINGLE 44IN (TOURNIQUET CUFF) IMPLANT
DRAPE INCISE IOBAN 66X45 STRL (DRAPES) IMPLANT
DRAPE U-SHAPE 47X51 STRL (DRAPES) ×3 IMPLANT
DRESSING PREVENA PLUS CUSTOM (GAUZE/BANDAGES/DRESSINGS) ×1 IMPLANT
DRSG PREVENA PLUS CUSTOM (GAUZE/BANDAGES/DRESSINGS) ×3
DURAPREP 26ML APPLICATOR (WOUND CARE) ×3 IMPLANT
ELECT REM PT RETURN 9FT ADLT (ELECTROSURGICAL) ×3
ELECTRODE REM PT RTRN 9FT ADLT (ELECTROSURGICAL) ×1 IMPLANT
GLOVE BIOGEL PI IND STRL 9 (GLOVE) ×1 IMPLANT
GLOVE BIOGEL PI INDICATOR 9 (GLOVE) ×2
GLOVE SURG ORTHO 9.0 STRL STRW (GLOVE) ×3 IMPLANT
GOWN STRL REUS W/ TWL XL LVL3 (GOWN DISPOSABLE) ×2 IMPLANT
GOWN STRL REUS W/TWL XL LVL3 (GOWN DISPOSABLE) ×4
KIT BASIN OR (CUSTOM PROCEDURE TRAY) ×3 IMPLANT
KIT TURNOVER KIT B (KITS) ×3 IMPLANT
MANIFOLD NEPTUNE II (INSTRUMENTS) ×3 IMPLANT
NS IRRIG 1000ML POUR BTL (IV SOLUTION) ×3 IMPLANT
PACK ORTHO EXTREMITY (CUSTOM PROCEDURE TRAY) ×3 IMPLANT
PAD ARMBOARD 7.5X6 YLW CONV (MISCELLANEOUS) ×3 IMPLANT
PREVENA RESTOR ARTHOFORM 46X30 (CANNISTER) ×2 IMPLANT
SPONGE LAP 18X18 X RAY DECT (DISPOSABLE) IMPLANT
STAPLER VISISTAT 35W (STAPLE) IMPLANT
STOCKINETTE IMPERVIOUS LG (DRAPES) ×3 IMPLANT
SUT SILK 2 0 (SUTURE) ×2
SUT SILK 2-0 18XBRD TIE 12 (SUTURE) ×1 IMPLANT
SUT VIC AB 1 CTX 27 (SUTURE) IMPLANT
TOWEL OR 17X26 10 PK STRL BLUE (TOWEL DISPOSABLE) ×3 IMPLANT
TUBE CONNECTING 12'X1/4 (SUCTIONS) ×1
TUBE CONNECTING 12X1/4 (SUCTIONS) ×2 IMPLANT
YANKAUER SUCT BULB TIP NO VENT (SUCTIONS) ×3 IMPLANT

## 2017-11-11 NOTE — Anesthesia Procedure Notes (Signed)
Procedure Name: MAC Date/Time: 11/11/2017 9:21 AM Performed by: Oletta Lamas, CRNA Pre-anesthesia Checklist: Patient identified, Emergency Drugs available, Suction available, Patient being monitored and Timeout performed Patient Re-evaluated:Patient Re-evaluated prior to induction Oxygen Delivery Method: Simple face mask

## 2017-11-11 NOTE — Anesthesia Procedure Notes (Addendum)
Anesthesia Regional Block: Popliteal block   Pre-Anesthetic Checklist: ,, timeout performed, Correct Patient, Correct Site, Correct Laterality, Correct Procedure, Correct Position, site marked, Risks and benefits discussed,  Surgical consent,  Pre-op evaluation,  At surgeon's request and post-op pain management  Laterality: Lower and Right  Prep: chloraprep       Needles:  Injection technique: Single-shot  Needle Type: Echogenic Needle          Additional Needles:   Procedures:,,,, ultrasound used (permanent image in chart),,,,  Narrative:  Start time: 11/11/2017 9:01 AM End time: 11/11/2017 9:11 AM Injection made incrementally with aspirations every 5 mL.  Performed by: Personally   Additional Notes: H+P and labs reviewed, risks and benefits discussed with patient, procedure tolerated well without complications

## 2017-11-11 NOTE — Anesthesia Preprocedure Evaluation (Signed)
Anesthesia Evaluation  Patient identified by MRN, date of birth, ID band Patient awake    Reviewed: Allergy & Precautions, NPO status , Patient's Chart, lab work & pertinent test results  History of Anesthesia Complications Negative for: history of anesthetic complications  Airway Mallampati: II  TM Distance: >3 FB Neck ROM: Full    Dental  (+) Dental Advisory Given   Pulmonary neg pulmonary ROS,    breath sounds clear to auscultation       Cardiovascular hypertension, Pt. on medications + Peripheral Vascular Disease   Rhythm:Regular Rate:Normal     Neuro/Psych PSYCHIATRIC DISORDERS Schizophrenia negative neurological ROS     GI/Hepatic negative GI ROS, Neg liver ROS,   Endo/Other  diabetes, Type 2, Insulin Dependent  Renal/GU negative Renal ROS     Musculoskeletal  (+) Arthritis ,   Abdominal   Peds  Hematology  (+) anemia ,   Anesthesia Other Findings   Reproductive/Obstetrics                             Anesthesia Physical Anesthesia Plan  ASA: III  Anesthesia Plan: MAC and Regional   Post-op Pain Management:    Induction:   PONV Risk Score and Plan: 2 and Treatment may vary due to age or medical condition  Airway Management Planned: Nasal Cannula  Additional Equipment:   Intra-op Plan:   Post-operative Plan:   Informed Consent: I have reviewed the patients History and Physical, chart, labs and discussed the procedure including the risks, benefits and alternatives for the proposed anesthesia with the patient or authorized representative who has indicated his/her understanding and acceptance.   Dental advisory given  Plan Discussed with: Surgeon and CRNA  Anesthesia Plan Comments:         Anesthesia Quick Evaluation

## 2017-11-11 NOTE — Interval H&P Note (Signed)
History and Physical Interval Note:  11/11/2017 7:30 AM  Gina [name]  has presented today for surgery, with the diagnosis of gangrene righ heel  The various methods of treatment have been discussed with the patient and family. After consideration of risks, benefits and other options for treatment, the patient has consented to  Procedure(s): AMPUTATION BELOW KNEE (Right) as a surgical intervention .  The patient's history has been reviewed, patient examined, no change in status, stable for surgery.  I have reviewed the patient's chart and labs.  Questions were answered to the patient's satisfaction.     [name]

## 2017-11-11 NOTE — Op Note (Signed)
   Date of Surgery: 11/11/2017  INDICATIONS: Gina [name] is a 57 y.o.-year-old female who has complete destruction of her heel pad from abscess and infection.  She has exposed calcaneus.  Foot salvage interventions are not possible at this time due to the extensive soft tissue loss.  Patient presents at this time for transtibial amputation.[name]  PREOPERATIVE DIAGNOSIS: Abscess ulceration osteomyelitis right calcaneus  POSTOPERATIVE DIAGNOSIS: Same.  PROCEDURE: Transtibial amputation Application of Prevena wound VAC  SURGEON: Sharol Given, M.D.  ANESTHESIA:  general  IV FLUIDS AND URINE: See anesthesia.  ESTIMATED BLOOD LOSS: Minimal mL.  COMPLICATIONS: None.  DESCRIPTION OF PROCEDURE: The patient was brought to the operating room and underwent a general anesthetic. After adequate levels of anesthesia were obtained patient's lower extremity was prepped using DuraPrep draped into a sterile field. A timeout was called. The foot was draped out of the sterile field with impervious stockinette. A transverse incision was made 11 cm distal to the tibial tubercle. This curved proximally and a large posterior flap was created. The tibia was transected 1 cm proximal to the skin incision. The fibula was transected just proximal to the tibial incision. The tibia was beveled anteriorly. A large posterior flap was created. The sciatic nerve was pulled cut and allowed to retract. The vascular bundles were suture ligated with 2-0 silk. The deep and superficial fascial layers were closed using #1 Vicryl. The skin was closed using staples and 2-0 nylon. The wound was covered with a Prevena wound VAC. There was a good suction fit. A prosthetic shrinker was applied. Patient was extubated taken to the PACU in stable condition.   DISCHARGE PLANNING:  Antibiotic duration: Continue antibiotics for 24 hours postoperatively.  Weightbearing: Nonweightbearing on the right  Pain medication: Opioid pathway ordered  Dressing  care/ Wound VAC: Continue wound VAC at discharge and attached to the Praveena plus portable pump  Discharge to: Skilled nursing facility  Follow-up: In the office 1 week post operative.  [name], [name] 10:07 AM

## 2017-11-11 NOTE — Progress Notes (Signed)
PT Cancellation Note  Patient Details Name: Gina [name] MRN: [id number] DOB: Jul 21, 1960   Cancelled Treatment:    Reason Eval/Treat Not Completed: Patient at procedure or test/unavailable.  Pt is in the OR.  PT will evaluate tomorrow.  Thanks,    Barbarann Ehlers. [place], [place], DPT [phone]   11/11/2017, 9:23 AM

## 2017-11-11 NOTE — Transfer of Care (Signed)
Immediate Anesthesia Transfer of Care Note  Patient: Gina [name]  Procedure(s) Performed: AMPUTATION BELOW KNEE (Right Leg Lower)  Patient Location: PACU  Anesthesia Type:MAC combined with regional for post-op pain  Level of ConsciousnessAAOx3   Airway & Oxygen Therapy: Patient Spontanous Breathing and Patient connected to nasal cannula oxygen  Post-op Assessment: Report given to RN and Post -op Vital signs reviewed and stable  Post vital signs: Reviewed and stable  Last Vitals:  Vitals Value Taken Time  BP 155/69 11/11/2017 10:14 AM  Temp    Pulse 81 11/11/2017 10:16 AM  Resp 14 11/11/2017 10:16 AM  SpO2 99 % 11/11/2017 10:16 AM  Vitals shown include unvalidated device data.  Last Pain:  Vitals:   11/11/17 0801  TempSrc: Oral  PainSc:          Complications: No apparent anesthesia complications

## 2017-11-11 NOTE — OR Nursing (Signed)
Patient consent signed by sister after patient requested her to do so in short stay

## 2017-11-11 NOTE — Progress Notes (Addendum)
PROGRESS NOTE        PATIENT DETAILS Name: Gina [name] Age: 57 y.o. Sex: female Date of Birth: 07/24/1960 Admit Date: 11/06/2017 Admitting Physician [name], MD [id number], Maylon Peppers, FNP  Brief Narrative: Patient is a 57 y.o. 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  Evaluated by Dr. Sharol Given, plan is for irrigation and debridement on 8/30.  Subjective:  Patient in bed, appears comfortable, denies any headache, no fever, no chest pain or pressure, no shortness of breath , no abdominal pain. No focal weakness.   Assessment/Plan:  Right diabetic foot with osteomyelitis and heel abscess: Continue empiric vancomycin cefepime and Flagyl.  Dr. Sharol Given following- psot R [place] with W Vac on 11/11/17.  She is afebrile, nontoxic-appearing-leukocytosis has improved compared to admission.  Blood cultures on 8/27- so far.  Wound cultures preliminary growing both gram-positive cocci and gram-negative rods, she is currently on vancomycin + Maxipime IV on 11/10/2017.  Follow final wound care culture results. Stop ABX 11/12/17.  Insulin-dependent DM-2:  Adjusted Lantus and ISS, monitor. Poor outpt control.  Lab Results  Component Value Date   HGBA1C 12.9 (H) 10/04/2017     CBG (last 3)  Recent Labs    11/11/17 0757 11/11/17 1016 11/11/17 1202  GLUCAP 212* 221* 171*     Hypertension: Controlled, continue lisinopril  Normocytic anemia: Probably related to chronic disease-hemoglobin stable for close follow-up.  On iron supplementation.  Peripheral neuropathy: Probably related to diabetes-continue Neurontin  Schizophrenia: Appears to be stable, continue Lexapro   DVT Prophylaxis: Prophylactic Lovenox  Code Status: Full code   Family Communication: None at bedside  Disposition Plan: Remain  inpatient-requires several more days of hospitalization-probably home health services on discharge.  Antimicrobial agents: Anti-infectives (From admission, onward)   Start     Dose/Rate Route Frequency Ordered Stop   11/11/17 0600  ceFAZolin (ANCEF) IVPB 2g/100 mL premix     2 g 200 mL/hr over 30 Minutes Intravenous To Short Stay 11/10/17 1715 11/11/17 0911   11/11/17 0000  vancomycin (VANCOCIN) IVPB 1000 mg/200 mL premix     1,000 mg 200 mL/hr over 60 Minutes Intravenous Every 12 hours 11/10/17 1357     11/09/17 0600  ceFAZolin (ANCEF) IVPB 2g/100 mL premix     2 g 200 mL/hr over 30 Minutes Intravenous To ShortStay Surgical 11/08/17 1934 11/09/17 1233   11/07/17 1200  vancomycin (VANCOCIN) 1,250 mg in sodium chloride 0.9 % 250 mL IVPB  Status:  Discontinued     1,250 mg 166.7 mL/hr over 90 Minutes Intravenous Every 24 hours 11/06/17 1345 11/10/17 1357   11/07/17 1000  metroNIDAZOLE (FLAGYL) IVPB 500 mg     500 mg 100 mL/hr over 60 Minutes Intravenous Every 8 hours 11/07/17 0932     11/06/17 2000  ceFEPIme (MAXIPIME) 2 g in sodium chloride 0.9 % 100 mL IVPB     2 g 200 mL/hr over 30 Minutes Intravenous Every 8 hours 11/06/17 1345     11/06/17 1200  vancomycin (VANCOCIN) 1,500 mg in sodium chloride 0.9 % 500 mL IVPB     1,500 mg 250 mL/hr over 120 Minutes Intravenous  Once 11/06/17 1127 11/07/17 1900   11/06/17 1130  ceFEPIme (MAXIPIME) 2 g in sodium chloride 0.9 %  100 mL IVPB     2 g 200 mL/hr over 30 Minutes Intravenous  Once 11/06/17 1127 11/06/17 1258      Procedures: None  CONSULTS:  orthopedic surgery  Time spent: 25- minutes-Greater than 50% of this time was spent in counseling, explanation of diagnosis, planning of further management, and coordination of care.  MEDICATIONS: Scheduled Meds: . Chlorhexidine Gluconate Cloth  6 each Topical Daily  . docusate sodium  100 mg Oral BID  . docusate sodium  100 mg Oral BID  . enoxaparin (LOVENOX) injection  40 mg  Subcutaneous Q24H  . escitalopram  20 mg Oral Daily  . ferrous sulfate  325 mg Oral BID WC  . gabapentin  300 mg Oral QHS  . gabapentin  300 mg Oral TID  . insulin aspart  0-15 Units Subcutaneous Q4H  . insulin glargine  12 Units Subcutaneous Daily  . lisinopril  5 mg Oral Daily  . methocarbamol      . mupirocin ointment  1 application Nasal BID  . sodium chloride flush  3 mL Intravenous Q12H   Continuous Infusions: . sodium chloride Stopped (11/07/17 1243)  . sodium chloride    . ceFEPime (MAXIPIME) IV 2 g (11/11/17 0616)  . lactated ringers 75 mL/hr (11/11/17 0616)  . lactated ringers    . methocarbamol (ROBAXIN) IV    . methocarbamol (ROBAXIN) IV    . metronidazole 500 mg (11/11/17 0520)  . vancomycin 1,000 mg (11/11/17 0020)   PRN Meds:.sodium chloride, acetaminophen **OR** acetaminophen, [START ON 11/12/2017] acetaminophen, bisacodyl, bisacodyl, hydrALAZINE, HYDROmorphone (DILAUDID) injection, HYDROmorphone (DILAUDID) injection, magnesium citrate, magnesium citrate, methocarbamol **OR** methocarbamol (ROBAXIN) IV, methocarbamol **OR** methocarbamol (ROBAXIN) IV, metoCLOPramide **OR** metoCLOPramide (REGLAN) injection, metoCLOPramide **OR** metoCLOPramide (REGLAN) injection, morphine injection, ondansetron **OR** ondansetron (ZOFRAN) IV, ondansetron **OR** ondansetron (ZOFRAN) IV, ondansetron **OR** ondansetron (ZOFRAN) IV, oxyCODONE, oxyCODONE, oxyCODONE, oxyCODONE, polyethylene glycol, polyethylene glycol, sodium chloride flush, traMADol   PHYSICAL EXAM: Vital signs: Vitals:   11/11/17 1030 11/11/17 1045 11/11/17 1059 11/11/17 1120  BP: (!) 153/70 (!) 146/74 (!) 158/83 (!) 158/89  Pulse: 79 77 76 76  Resp: 12 15 13 16   Temp:   98.1 F (36.7 C) 98.4 F (36.9 C)  TempSrc:    Oral  SpO2: 100% 100% 100% 99%  Weight:      Height:       Filed Weights   11/06/17 1002 11/09/17 0958  Weight: 73.5 kg 73 kg   Body mass index is 29.44 kg/m.   Exam  Awake Alert, Oriented X 3,  No new F.N deficits, Normal affect [place].AT,PERRAL Supple Neck,No JVD, No cervical lymphadenopathy appriciated.  Symmetrical Chest wall movement, Good air movement bilaterally, CTAB RRR,No Gallops, Rubs or new Murmurs, No Parasternal Heave +ve B.Sounds, Abd Soft, No tenderness, No organomegaly appriciated, No rebound - guarding or rigidity. No Cyanosis, Clubbing or edema, R.BKA with W Vac and bandage    I have personally reviewed following labs and imaging studies  LABORATORY DATA: CBC: Recent Labs  Lab 11/06/17 1058 11/07/17 0602 11/08/17 0734 11/09/17 0435 11/10/17 0446 11/11/17 0408  WBC 20.4* 16.7* 16.9* 18.5* 18.4* 14.7*  NEUTROABS 16.4*  --   --   --   --   --   HGB 10.0* 9.9* 9.6* 10.0* 8.8* 9.1*  HCT 30.4* 30.6* 29.9* 31.9* 28.0* 29.3*  MCV 90.7 91.9 92.9 93.3 94.0 93.0  PLT 338 352 306 368 364 402*    Basic Metabolic Panel: Recent Labs  Lab 11/07/17 0602 11/08/17 0734 11/09/17  3662 11/10/17 0446 11/11/17 0408  NA 135 134* 131* 134* 133*  K 4.7 4.4 4.0 4.3 4.5  CL 100 99 99 100 102  CO2 28 29 26 27 25   GLUCOSE 120* 80 105* 72 261*  BUN 13 10 8 7 11   CREATININE 0.59 0.63 0.56 0.59 0.61  CALCIUM 8.6* 8.2* 8.1* 8.0* 7.8*    GFR: Estimated Creatinine Clearance: 73.5 mL/min (by C-G formula based on SCr of 0.61 mg/dL).  Liver Function Tests: Recent Labs  Lab 11/06/17 1058 11/10/17 0446  AST 17 20  ALT 16 12  ALKPHOS 84 64  BILITOT 0.4 0.8  PROT 7.4 5.6*  ALBUMIN 2.7* 1.7*   No results for input(s): LIPASE, AMYLASE in the last 168 hours. No results for input(s): AMMONIA in the last 168 hours.  Coagulation Profile: No results for input(s): INR, PROTIME in the last 168 hours.  Cardiac Enzymes: No results for input(s): CKTOTAL, CKMB, CKMBINDEX, TROPONINI in the last 168 hours.  BNP (last 3 results) No results for input(s): PROBNP in the last 8760 hours.  HbA1C: No results for input(s): HGBA1C in the last 72 hours.  CBG: Recent Labs  Lab  11/10/17 1626 11/10/17 2150 11/11/17 0757 11/11/17 1016 11/11/17 1202  GLUCAP 238* 259* 212* 221* 171*    Lipid Profile: No results for input(s): CHOL, HDL, LDLCALC, TRIG, CHOLHDL, LDLDIRECT in the last 72 hours.  Thyroid Function Tests: No results for input(s): TSH, T4TOTAL, FREET4, T3FREE, THYROIDAB in the last 72 hours.  Anemia Panel: No results for input(s): VITAMINB12, FOLATE, FERRITIN, TIBC, IRON, RETICCTPCT in the last 72 hours.  Urine analysis:    Component Value Date/Time   COLORURINE YELLOW 11/09/2017 0929   APPEARANCEUR HAZY (A) 11/09/2017 0929   LABSPEC 1.013 11/09/2017 0929   PHURINE 6.0 11/09/2017 0929   GLUCOSEU NEGATIVE 11/09/2017 0929   HGBUR LARGE (A) 11/09/2017 0929   BILIRUBINUR NEGATIVE 11/09/2017 0929   BILIRUBINUR N 04/28/2016 1603   KETONESUR NEGATIVE 11/09/2017 0929   PROTEINUR 30 (A) 11/09/2017 0929   UROBILINOGEN 0.2 04/28/2016 1603   UROBILINOGEN 0.2 06/18/2013 0650   NITRITE NEGATIVE 11/09/2017 0929   LEUKOCYTESUR NEGATIVE 11/09/2017 0929    Sepsis Labs: Lactic Acid, Venous    Component Value Date/Time   LATICACIDVEN 1.20 11/06/2017 1109    MICROBIOLOGY: Recent Results (from the past 240 hour(s))  Culture, blood (routine x 2)     Status: None   Collection Time: 11/06/17 10:23 AM  Result Value Ref Range Status   Specimen Description   Final    BLOOD LEFT ANTECUBITAL Performed at [facility], [place] [address] [place], [place] [address]    Special Requests   Final    BOTTLES DRAWN AEROBIC AND ANAEROBIC Blood Culture results may not be optimal due to an inadequate volume of blood received in culture bottles Performed at [place] [address] [place], [place] [address]    Culture   Final    NO GROWTH 5 DAYS Performed at [facility], [place] [address] [place], [place] [address]    Report Status 11/11/2017 FINAL  Final  Culture, blood (routine x 2)     Status: None   Collection  Time: 11/06/17 10:28 AM  Result Value Ref Range Status   Specimen Description   Final    BLOOD RIGHT HAND Performed at [place] [address], [place], [place] [address]    Special Requests   Final    BOTTLES  DRAWN AEROBIC AND ANAEROBIC Blood Culture results may not be optimal due to an inadequate volume of blood received in culture bottles Performed at [facility], [place] [address] [place], [place] [address]    Culture   Final    NO GROWTH 5 DAYS Performed at [facility], [place] [address], [place], [place] [address]    Report Status 11/11/2017 FINAL  Final  Surgical pcr screen     Status: Abnormal   Collection Time: 11/07/17  6:55 PM  Result Value Ref Range Status   MRSA, PCR NEGATIVE NEGATIVE Final   Staphylococcus aureus POSITIVE (A) NEGATIVE Final    Comment: (NOTE) The Xpert SA Assay (FDA approved for NASAL specimens in patients 70 years of age and older), is one component of a comprehensive surveillance program. It is not intended to diagnose infection nor to guide or monitor treatment. Performed at [facility], [place] [address] [place], [place] [address]   Aerobic/Anaerobic Culture (surgical/deep wound)     Status: None (Preliminary result)   Collection Time: 11/09/17 12:48 PM  Result Value Ref Range Status   Specimen Description TISSUE RIGHT FOOT HEEL  Final   Special Requests SPEC IN SWABS  Final   Gram Stain   Final    ABUNDANT WBC PRESENT,BOTH PMN AND MONONUCLEAR ABUNDANT GRAM POSITIVE COCCI FEW GRAM NEGATIVE RODS    Culture   Final    MODERATE STAPHYLOCOCCUS AUREUS SUSCEPTIBILITIES TO FOLLOW HOLDING FOR POSSIBLE ANAEROBE Performed at [facility], [place] [address], [place], [place] [address]    Report Status PENDING  Incomplete    RADIOLOGY STUDIES/RESULTS: Mr Foot Right Wo Contrast  Result Date: 11/06/2017 CLINICAL DATA:  Diabetic patient with right foot pain and swelling. History of prior  amputation. EXAM: MRI OF THE RIGHT FOREFOOT WITHOUT CONTRAST TECHNIQUE: Multiplanar, multisequence MR imaging of the right forefoot was performed. No intravenous contrast was administered. COMPARISON:  Plain films right foot earlier today. FINDINGS: Bones/Joint/Cartilage The patient is status post amputation at the level of the mid diaphysis of the third metatarsal. Abnormal increased T2 and decreased T1 signal are seen in the distal 4.5 cm of the first metatarsal and throughout the proximal phalanx of the great toe. Milder degree of edema is present in the distal phalanx of the great toe. Abnormal edema and decreased T1 signal are also seen throughout the second metatarsal and proximal phalanx of the second toe. Remnant of the third metatarsal demonstrates diffusely abnormal signal. Abnormal signal is also present throughout almost the entire fourth metatarsal with sparing of the head and neck identified. Bone marrow signal is otherwise normal. Ligaments Intact. Muscles and Tendons No intramuscular fluid collection is identified. There is some atrophy of intrinsic musculature the foot. Soft tissues Diffuse and intense soft tissue edema is present about the dorsum of the foot. Somewhat milder degree of edema is seen in the distal lower leg and in the plantar soft tissues. There appears to be a skin ulceration on the plantar surface of the foot. IMPRESSION: Extensive abnormal signal in the first through fourth metatarsals and in the proximal phalanges of the first and second toes most consistent with osteomyelitis. Diffuse and intense subcutaneous edema about the foot and a milder degree of edema about the ankle most consistent with cellulitis. Negative for abscess or septic joint. Electronically Signed   By: Inge Rise M.D.   On: 11/06/2017 15:15   Dg Foot Complete Right  Result Date: 11/06/2017 CLINICAL DATA:  Diabetic patient with a wound on the right heel. EXAM: RIGHT FOOT COMPLETE - 3+ VIEW COMPARISON:   Plain films right foot 02/22/2017. FINDINGS: Since the prior examination, the patient has undergone amputation of the third metatarsal at the mid diaphysis. No bony destructive change or periosteal reaction is identified. Plantar calcaneal spur is noted. Small linear focus of superficial soft tissue gas is seen in the plantar soft tissues of the foot consistent with an ulceration. Severe and diffuse soft tissue edema is present about the foot. IMPRESSION: Severe, diffuse soft tissue edema about the right foot without plain film evidence of osteomyelitis. Small focus of linear gas in the superficial plantar soft tissues of the foot consistent with skin ulceration. Status post amputation at the level of the mid third metatarsal without complicating feature. Electronically Signed   By: Inge Rise M.D.   On: 11/06/2017 11:24     LOS: 5 days   Lala Lund, MD  [facility]  If 7PM-7AM, please contact night-coverage  Please page via [email or web address] [email or web address]-click on MD name and type text message  11/11/2017, 12:26 PM

## 2017-11-11 NOTE — Anesthesia Procedure Notes (Signed)
Anesthesia Regional Block: Femoral nerve block   Pre-Anesthetic Checklist: ,, timeout performed, Correct Patient, Correct Site, Correct Laterality, Correct Procedure, Correct Position, site marked, Risks and benefits discussed,  Surgical consent,  Pre-op evaluation,  At surgeon's request and post-op pain management  Laterality: Lower and Right  Prep: chloraprep       Needles:  Injection technique: Single-shot  Needle Type: Echogenic Stimulator Needle          Additional Needles:   Procedures:,,,, ultrasound used (permanent image in chart),,,,   Nerve Stimulator or Paresthesia:  Response: 0.5 mA,   Additional Responses:   Narrative:  Start time: 11/11/2017 9:01 AM End time: 11/11/2017 9:11 AM Injection made incrementally with aspirations every 5 mL.  Performed by: Personally  Anesthesiologist: Oleta Mouse, MD  Additional Notes: H+P and labs reviewed, risks and benefits discussed with patient, procedure tolerated well without complications

## 2017-11-12 ENCOUNTER — Encounter (HOSPITAL_COMMUNITY): Payer: Self-pay | Admitting: Orthopedic Surgery

## 2017-11-12 LAB — GLUCOSE, CAPILLARY
GLUCOSE-CAPILLARY: 262 mg/dL — AB (ref 70–99)
Glucose-Capillary: 202 mg/dL — ABNORMAL HIGH (ref 70–99)
Glucose-Capillary: 169 mg/dL — ABNORMAL HIGH (ref 70–99)
Glucose-Capillary: 244 mg/dL — ABNORMAL HIGH (ref 70–99)

## 2017-11-12 LAB — CBC
HCT: 24.6 % — ABNORMAL LOW (ref 36.0–46.0)
Hemoglobin: 7.7 g/dL — ABNORMAL LOW (ref 12.0–15.0)
MCH: 29.3 pg (ref 26.0–34.0)
MCHC: 31.3 g/dL (ref 30.0–36.0)
MCV: 93.5 fL (ref 78.0–100.0)
Platelets: 393 10*3/uL (ref 150–400)
RBC: 2.63 MIL/uL — AB (ref 3.87–5.11)
RDW: 13.5 % (ref 11.5–15.5)
WBC: 15.2 10*3/uL — ABNORMAL HIGH (ref 4.0–10.5)

## 2017-11-12 LAB — BASIC METABOLIC PANEL
Anion gap: 6 (ref 5–15)
BUN: 8 mg/dL (ref 6–20)
CO2: 27 mmol/L (ref 22–32)
Calcium: 8 mg/dL — ABNORMAL LOW (ref 8.9–10.3)
Chloride: 99 mmol/L (ref 98–111)
Creatinine, Ser: 0.6 mg/dL (ref 0.44–1.00)
GFR calc Af Amer: >60 mL/min (ref >60)
GFR calc non Af Amer: >60 mL/min (ref >60)
Glucose, Bld: 242 mg/dL — ABNORMAL HIGH (ref 70–99)
Potassium: 4.3 mmol/L (ref 3.5–5.1)
Sodium: 132 mmol/L — ABNORMAL LOW (ref 135–145)

## 2017-11-12 LAB — TYPE AND SCREEN
ABO/RH(D): O POS
Antibody Screen: NEGATIVE

## 2017-11-12 LAB — ABO/RH: ABO/RH(D): O POS

## 2017-11-12 MED ORDER — INSULIN GLARGINE 100 UNIT/ML ~~LOC~~ SOLN
15.0000 [IU] | Freq: Every day | SUBCUTANEOUS | Status: DC
  Administered 2017-11-12 – 2017-11-15 (×4): 15 [IU] via SUBCUTANEOUS
  Filled 2017-11-12 (×4): qty 0.15

## 2017-11-12 NOTE — NC FL2 (Signed)
[place] MEDICAID FL2 LEVEL OF CARE SCREENING TOOL     IDENTIFICATION  Patient Name: Gina [name] Birthdate: [birth year] Sex: female Admission Date (Current Location): 11/06/2017  [facility] and [place] Number:  Herbalist and Address:  [facility] [place], [place] [address] [place], [place] [address]      Provider Number: [id number]  Attending Physician Name and Address:  Thurnell Lose, MD  Relative Name and Phone Number:       Current Level of Care: Hospital Recommended Level of Care: [place] Prior Approval Number:    Date Approved/Denied:   PASRR Number: pending  Discharge Plan: SNF    Current Diagnoses: Patient Active Problem List   Diagnosis Date Noted  . Cellulitis and abscess of foot 11/06/2017  . Stress incontinence 05/30/2017  . Toe amputation status, right ([place]) 03/16/2017  . Sepsis ([place]) 02/22/2017  . Schizophrenia ([place])   . S/P transmetatarsal amputation of foot, left ([place]) 06/15/2016  . DM (diabetes mellitus), type 2, uncontrolled, periph vascular complic ([place]) 86/76/1950  . Anemia of chronic disease 06/07/2016  . Moderate protein-calorie malnutrition ([place]) 06/03/2016  . Osteomyelitis due to type 2 diabetes mellitus ([place]) 06/02/2016  . Colon cancer screening 02/17/2014  . Breast cancer screening 02/17/2014  . Diabetes ([place]) 07/28/2013  . Cholelithiases 07/28/2013  . DKA (diabetic ketoacidoses) ([place]) 06/18/2013  . HTN (hypertension) 06/18/2013  . Cellulitis of female genitalia 08/03/2012  . Hyperglycemia 07/31/2012  . Candidal skin infection 07/31/2012    Orientation RESPIRATION BLADDER Height & Weight     Self, Time, Situation, Place  Normal Incontinent Weight: 160 lb 15 oz (73 kg) Height:  5\' 2"  (157.5 cm)  BEHAVIORAL SYMPTOMS/MOOD NEUROLOGICAL BOWEL NUTRITION STATUS      Continent Diet(Carb modified, thin liquids)  AMBULATORY STATUS COMMUNICATION OF NEEDS Skin   Limited Assist Verbally Surgical  wounds, Wound Vac(Closed incision right leg, wound vac.)                       Personal Care Assistance Level of Assistance  Bathing, Feeding, Dressing Bathing Assistance: Limited assistance Feeding assistance: Independent Dressing Assistance: Limited assistance     Functional Limitations Info  Sight, Hearing, Speech Sight Info: Adequate Hearing Info: Adequate Speech Info: Adequate    SPECIAL CARE FACTORS FREQUENCY  PT (By licensed PT), OT (By licensed OT)     PT Frequency: 3x OT Frequency: 3x            Contractures Contractures Info: Not present    Additional Factors Info  Code Status, Allergies Code Status Info: Full Code Allergies Info: Metformin And Related           Current Medications (11/12/2017):  This is the current hospital active medication list Current Facility-Administered Medications  Medication Dose Route Frequency Provider Last Rate Last Dose  . acetaminophen (TYLENOL) tablet 325-650 mg  325-650 mg Oral Q6H PRN [name], MD      . bisacodyl (DULCOLAX) suppository 10 mg  10 mg Rectal Daily PRN [name], MD      . Chlorhexidine Gluconate Cloth 2 % PADS 6 each  6 each Topical Daily [name], MD   6 each at 11/10/17 0940  . docusate sodium (COLACE) capsule 100 mg  100 mg Oral BID [name], MD   100 mg at 11/12/17 9326  . docusate sodium (COLACE) capsule 100 mg  100 mg Oral BID [name], MD  100 mg at 11/12/17 0824  . enoxaparin (LOVENOX) injection 40 mg  40 mg Subcutaneous Q24H [name], MD   40 mg at 11/11/17 2022  . escitalopram (LEXAPRO) tablet 20 mg  20 mg Oral Daily [name], MD   20 mg at 11/12/17 7371  . ferrous sulfate tablet 325 mg  325 mg Oral BID WC [name], MD   325 mg at 11/12/17 0626  . gabapentin (NEURONTIN) capsule 300 mg  300 mg Oral QHS [name], MD   300 mg at 11/11/17 2022  . gabapentin (NEURONTIN) capsule 300 mg  300 mg Oral TID [name], MD   300 mg at 11/12/17 0823   . hydrALAZINE (APRESOLINE) injection 10 mg  10 mg Intravenous Q6H PRN [name], MD   10 mg at 11/11/17 0546  . HYDROmorphone (DILAUDID) injection 0.5-1 mg  0.5-1 mg Intravenous Q4H PRN [name], MD      . insulin aspart (novoLOG) injection 0-5 Units  0-5 Units Subcutaneous QHS Thurnell Lose, MD   3 Units at 11/11/17 2252  . insulin aspart (novoLOG) injection 0-9 Units  0-9 Units Subcutaneous TID WC Thurnell Lose, MD   3 Units at 11/12/17 [phone]  . insulin glargine (LANTUS) injection 15 Units  15 Units Subcutaneous Daily Thurnell Lose, MD   15 Units at 11/11/17 2252  . lisinopril (PRINIVIL,ZESTRIL) tablet 5 mg  5 mg Oral Daily [name], MD   5 mg at 11/12/17 4627  . magnesium citrate solution 1 Bottle  1 Bottle Oral Once PRN [name], MD      . magnesium citrate solution 1 Bottle  1 Bottle Oral Once PRN [name], MD      . metroNIDAZOLE (FLAGYL) IVPB 500 mg  500 mg Intravenous Q8H [name], MD 100 mL/hr at 11/12/17 0441 500 mg at 11/12/17 0441  . morphine 2 MG/ML injection 2 mg  2 mg Intravenous Q4H PRN [name], MD      . mupirocin ointment (BACTROBAN) 2 % 1 application  1 application Nasal BID [name], MD   1 application at 03/50/09 2023  . ondansetron (ZOFRAN) injection 4 mg  4 mg Intravenous Q6H PRN [name], MD      . oxyCODONE (Oxy IR/ROXICODONE) immediate release tablet 5-10 mg  5-10 mg Oral Q4H PRN [name], MD   5 mg at 11/11/17 2022  . polyethylene glycol (MIRALAX / GLYCOLAX) packet 17 g  17 g Oral Daily PRN [name], MD      . traMADol Veatrice [place]) tablet 100 mg  100 mg Oral Q6H PRN [name], MD   100 mg at 11/08/17 2003     Discharge Medications: Please see discharge summary for a list of discharge medications.  Relevant Imaging Results:  Relevant Lab Results:   Additional Information SSN: [id number]  Eileen Stanford, LCSW

## 2017-11-12 NOTE — Clinical Social Work Note (Signed)
Clinical Social Work Assessment  Patient Details  Name: Gina [name] MRN: [id number] Date of Birth: 28-Jan-1961  Date of referral:  11/12/17               Reason for consult:  Facility Placement                Permission sought to share information with:  Family Supports Permission granted to share information::     Name::     Camera operator::  [employer]  Relationship::  Son  Sport and exercise psychologist Information:     Housing/Transportation Living arrangements for the past 2 months:  Single Family Home Source of Information:  Patient Patient Interpreter Needed:  None Criminal Activity/Legal Involvement Pertinent to Current Situation/Hospitalization:  No - Comment as needed Significant Relationships:  Adult Children Lives with:  Adult Children Do you feel safe going back to the place where you live?  No Need for family participation in patient care:  No (Coment)  Care giving concerns:  Pt is alert and oriented   Facilities manager / plan:  CSW spoke with pt at bedside. Pt is agreeable to SNF. Pt would prefer [facility]. CSW will follow up with facility.   Employment status:  Retired Forensic scientist:  Medicaid In [place] PT Recommendations:  [place] / Referral to community resources:  [place]  Patient/Family's Response to care:  Pt verbalized understanding of CSW role and expressed appreciation for support. Pt denies any concern regarding pt care at this time.   Patient/Family's Understanding of and Emotional Response to Diagnosis, Current Treatment, and Prognosis:  Pt understanding and realistic regarding physical limitations. Pt understands the need for SNF placement at d/c. Pt agreeable to SNF placement at d/c, at this time. Pt's responses emotionally appropriate during conversation with CSW. Pt denies any concern regarding treatment plan at this time. CSW will continue to provide support and facilitate d/c needs.   Emotional  Assessment Appearance:  Appears stated age Attitude/Demeanor/Rapport:  (Patient was appropriate) Affect (typically observed):  Accepting, Appropriate, Calm Orientation:  Oriented to Self, Oriented to Place, Oriented to  Time, Oriented to Situation Alcohol / Substance use:  Not Applicable Psych involvement (Current and /or in the community):  No (Comment)  Discharge Needs  Concerns to be addressed:  Basic Needs Readmission within the last 30 days:  No Current discharge risk:  Dependent with Mobility Barriers to Discharge:  Continued Medical Work up   [employer], LCSW 11/12/2017, 1:01 PM

## 2017-11-12 NOTE — Evaluation (Signed)
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 PMH: DM, schizophrenia, HTN, L transmet amputation  Clinical Impression  Pt admitted with above diagnosis. Pt currently with functional limitations due to the deficits listed below (see PT Problem List). Pt concerned about housing issues because she was supposed to have meeting with housing suthority about moving last week. Due to this was initially resistant to SNF but after discussing benefits, was agreeable and would like to go to [facility] if possible. Currently needing min A to get to EOB and stand to turn to chair. Pt trying to reach son to request that he bring shoe for L foot so she can progress with ambulation.  Pt will benefit from skilled PT to increase their independence and safety with mobility to allow discharge to the venue listed below.       Follow Up Recommendations SNF;Supervision for mobility/OOB    Equipment Recommendations  None recommended by PT    Recommendations for Other Services       Precautions / Restrictions Precautions Precautions: Fall;Knee Precaution Booklet Issued: Yes (comment) Precaution Comments: reviewed proper positioning of knee and gave amputee exercise handout Restrictions Weight Bearing Restrictions: No RLE Weight Bearing: Non weight bearing Other Position/Activity Restrictions: wound vac      Mobility  Bed Mobility Overal bed mobility: Needs Assistance Bed Mobility: Supine to Sit     Supine to sit: Min assist     General bed mobility comments: min A for mgmt of RLE  Transfers Overall transfer level: Needs assistance Equipment used: Rolling walker (2 wheeled) Transfers: Sit to/from [facility] Sit to Stand: Min assist Stand pivot transfers: Min assist       General transfer comment: min A for power up to  stand and min A to help pt move RW to pivot to chair  Ambulation/Gait             General Gait Details: would prefer not to progress ambulation until her son brings a shoe for the L foot as she has h/o multiple amps that side as well  Stairs            Wheelchair Mobility    Modified Rankin (Stroke Patients Only)       Balance Overall balance assessment: Needs assistance Sitting-balance support: Single extremity supported Sitting balance-Leahy Scale: Fair Sitting balance - Comments: pt beginning to adjust to wt change RLE   Standing balance support: Bilateral upper extremity supported Standing balance-Leahy Scale: Poor Standing balance comment: reliant on UE support                             Pertinent Vitals/Pain Pain Assessment: Faces Faces Pain Scale: Hurts even more Pain Location: RLE  Pain Descriptors / Indicators: Throbbing;Operative site guarding Pain Intervention(s): Limited activity within patient's tolerance;Monitored during session    [place] expects to be discharged to:: Private residence Living Arrangements: Alone Available Help at Discharge: Family;Available PRN/intermittently Type of Home: Apartment Home Access: Level entry     Home Layout: One level Home Equipment: Walker - 2 wheels;Bedside commode;Wheelchair - manual      Prior Function Level of Independence: Independent         Comments: got around mostly with w/c     Hand Dominance   Dominant Hand: Right  Extremity/Trunk Assessment   Upper Extremity Assessment Upper Extremity Assessment: Overall WFL for tasks assessed    Lower Extremity Assessment Lower Extremity Assessment: RLE deficits/detail RLE Deficits / Details: hip WFL, knee flex/ ext ROM WFL at this point with mild swelling RLE Sensation: decreased light touch;decreased proprioception RLE Coordination: WNL    Cervical / Trunk Assessment Cervical / Trunk Assessment: Normal   Communication   Communication: No difficulties  Cognition Arousal/Alertness: Awake/alert Behavior During Therapy: WFL for tasks assessed/performed Overall Cognitive Status: Within Functional Limits for tasks assessed                                        General Comments General comments (skin integrity, edema, etc.): discussed phantom pain and strategies    Exercises Amputee Exercises Quad Sets: AROM;Both;10 reps;Supine Gluteal Sets: AROM;Both;10 reps;Supine Hip ABduction/ADduction: AROM;Right;10 reps;Supine Knee Flexion: AROM;Right;10 reps;Supine Knee Extension: AROM;Right;10 reps;Supine Straight Leg Raises: AROM;Right;5 reps;Supine   Assessment/Plan    PT Assessment Patient needs continued PT services  PT Problem List Decreased activity tolerance;Decreased balance;Decreased mobility;Decreased knowledge of use of DME;Decreased knowledge of precautions;Pain;Impaired sensation       PT Treatment Interventions DME instruction;Gait training;Functional mobility training;Therapeutic activities;Therapeutic exercise;Balance training;Neuromuscular re-education;Patient/family education    PT Goals (Current goals can be found in the Care Plan section)  Acute Rehab PT Goals Patient Stated Goal: return home PT Goal Formulation: With patient Time For Goal Achievement: 11/26/17 Potential to Achieve Goals: Good    Frequency Min 3X/week   Barriers to discharge Decreased caregiver support      Co-evaluation               AM-PAC PT "6 Clicks" Daily Activity  Outcome Measure Difficulty turning over in bed (including adjusting bedclothes, sheets and blankets)?: Unable Difficulty moving from lying on back to sitting on the side of the bed? : Unable Difficulty sitting down on and standing up from a chair with arms (e.g., wheelchair, bedside commode, etc,.)?: Unable Help needed moving to and from a bed to chair (including a wheelchair)?: A Lot Help needed walking  in hospital room?: A Lot Help needed climbing 3-5 steps with a railing? : Total 6 Click Score: 8    End of Session Equipment Utilized During Treatment: Gait belt Activity Tolerance: Patient tolerated treatment well Patient left: in chair;with call bell/phone within reach;with chair alarm set Nurse Communication: Mobility status PT Visit Diagnosis: Unsteadiness on feet (R26.81);Pain;Difficulty in walking, not elsewhere classified (R26.2) Pain - Right/Left: Right Pain - part of body: Leg    Time: 1020-1102 PT Time Calculation (min) (ACUTE ONLY): 42 min   Charges:   PT Evaluation $PT Eval Moderate Complexity: 1 Mod PT Treatments $Therapeutic Exercise: 8-22 mins $Therapeutic Activity: 8-22 mins        Leighton Roach, PT  Acute Rehab Services  [place] 11/12/2017, 11:23 AM

## 2017-11-12 NOTE — Anesthesia Postprocedure Evaluation (Signed)
Anesthesia Post Note  Patient: Gina [name]  Procedure(s) Performed: AMPUTATION BELOW KNEE (Right Leg Lower)     Patient location during evaluation: PACU Anesthesia Type: Regional and MAC Level of consciousness: awake and alert Pain management: pain level controlled Vital Signs Assessment: post-procedure vital signs reviewed and stable Respiratory status: spontaneous breathing, nonlabored ventilation, respiratory function stable and patient connected to nasal cannula oxygen Cardiovascular status: stable and blood pressure returned to baseline Postop Assessment: no apparent nausea or vomiting Anesthetic complications: no    Last Vitals:  Vitals:   11/11/17 1943 11/12/17 0004  BP: 140/85 (!) 143/73  Pulse: 87 81  Resp:    Temp: 36.7 C 37.2 C  SpO2: 98% 96%    Last Pain:  Vitals:   11/11/17 2122  TempSrc:   PainSc: 0-No pain                 Silver Achey

## 2017-11-12 NOTE — Progress Notes (Signed)
Orthopedic Tech Progress Note Patient Details:  Gina [name] 06/23/1960 [id number]  Patient ID: Gina [name], female   DOB: 1960-04-30, 57 y.o.   MRN: [id number]   Gina [name] [employer]anger for right stump shrinker and limb guard. 11/12/2017, 12:26 PM

## 2017-11-12 NOTE — Progress Notes (Signed)
PROGRESS NOTE        PATIENT DETAILS Name: Gina [name] Age: 57 y.o. Sex: female Date of Birth: 1960/10/20 Admit Date: 11/06/2017 Admitting Physician [name], MD QBH:[id number], Maylon Peppers, FNP  Brief Narrative: Patient is a 56 y.o. 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  Evaluated by Dr. Sharol Given, plan is for irrigation and debridement on 8/30.  Subjective:  Patient in bed, appears comfortable, denies any headache, no fever, no chest pain or pressure, no shortness of breath , no abdominal pain. No focal weakness.  Assessment/Plan:  Right diabetic foot with osteomyelitis and heel abscess: Continue empiric vancomycin cefepime and Flagyl.  Dr. Sharol Given following- psot R Alfred with W Vac on 11/11/17.  He is now close to her baseline, stop vancomycin and Maxipime on 11/12/2017, case discussed with Dr. Sharol Given, she will go with portable wound VAC, currently refusing SNF, if stable discharge tomorrow likely with home health PT.  Mild perioperative blood loss related anemia.  Will monitor.  Insulin-dependent DM-2:  Adjusted Lantus and ISS, monitor. Poor outpt control.  Lab Results  Component Value Date   HGBA1C 12.9 (H) 10/04/2017     CBG (last 3)  Recent Labs    11/11/17 1548 11/11/17 2127 11/12/17 0724  GLUCAP 231* 254* 202*     Hypertension: Controlled, continue lisinopril  Normocytic anemia: Probably related to chronic disease-hemoglobin stable for close follow-up.  On iron supplementation.  Peripheral neuropathy: Probably related to diabetes-continue Neurontin  Schizophrenia: Appears to be stable, continue Lexapro   DVT Prophylaxis: Prophylactic Lovenox  Code Status: Full code   Family Communication: None at bedside  Disposition Plan: Remain inpatient-requires several more days  of hospitalization-probably home health services on discharge.  Antimicrobial agents: Anti-infectives (From admission, onward)   Start     Dose/Rate Route Frequency Ordered Stop   11/11/17 0600  ceFAZolin (ANCEF) IVPB 2g/100 mL premix     2 g 200 mL/hr over 30 Minutes Intravenous To Short Stay 11/10/17 1715 11/11/17 0911   11/11/17 0000  vancomycin (VANCOCIN) IVPB 1000 mg/200 mL premix  Status:  Discontinued     1,000 mg 200 mL/hr over 60 Minutes Intravenous Every 12 hours 11/10/17 1357 11/12/17 1139   11/09/17 0600  ceFAZolin (ANCEF) IVPB 2g/100 mL premix     2 g 200 mL/hr over 30 Minutes Intravenous To ShortStay Surgical 11/08/17 1934 11/09/17 1233   11/07/17 1200  vancomycin (VANCOCIN) 1,250 mg in sodium chloride 0.9 % 250 mL IVPB  Status:  Discontinued     1,250 mg 166.7 mL/hr over 90 Minutes Intravenous Every 24 hours 11/06/17 1345 11/10/17 1357   11/07/17 1000  metroNIDAZOLE (FLAGYL) IVPB 500 mg     500 mg 100 mL/hr over 60 Minutes Intravenous Every 8 hours 11/07/17 0932     11/06/17 2000  ceFEPIme (MAXIPIME) 2 g in sodium chloride 0.9 % 100 mL IVPB  Status:  Discontinued     2 g 200 mL/hr over 30 Minutes Intravenous Every 8 hours 11/06/17 1345 11/12/17 1139   11/06/17 1200  vancomycin (VANCOCIN) 1,500 mg in sodium chloride 0.9 % 500 mL IVPB     1,500 mg 250 mL/hr over 120 Minutes Intravenous  Once 11/06/17 1127 11/07/17 1900   11/06/17 1130  ceFEPIme (  MAXIPIME) 2 g in sodium chloride 0.9 % 100 mL IVPB     2 g 200 mL/hr over 30 Minutes Intravenous  Once 11/06/17 1127 11/06/17 1258      Procedures: None  CONSULTS:  orthopedic surgery  Time spent: 25- minutes-Greater than 50% of this time was spent in counseling, explanation of diagnosis, planning of further management, and coordination of care.  MEDICATIONS: Scheduled Meds: . Chlorhexidine Gluconate Cloth  6 each Topical Daily  . docusate sodium  100 mg Oral BID  . docusate sodium  100 mg Oral BID  . enoxaparin  (LOVENOX) injection  40 mg Subcutaneous Q24H  . escitalopram  20 mg Oral Daily  . ferrous sulfate  325 mg Oral BID WC  . gabapentin  300 mg Oral QHS  . gabapentin  300 mg Oral TID  . insulin aspart  0-5 Units Subcutaneous QHS  . insulin aspart  0-9 Units Subcutaneous TID WC  . insulin glargine  15 Units Subcutaneous Daily  . lisinopril  5 mg Oral Daily  . mupirocin ointment  1 application Nasal BID   Continuous Infusions: . metronidazole 500 mg (11/12/17 0441)   PRN Meds:.acetaminophen, bisacodyl, hydrALAZINE, HYDROmorphone (DILAUDID) injection, magnesium citrate, magnesium citrate, morphine injection, [DISCONTINUED] ondansetron **OR** ondansetron (ZOFRAN) IV, oxyCODONE, polyethylene glycol, traMADol   PHYSICAL EXAM: Vital signs: Vitals:   11/11/17 1943 11/12/17 0004 11/12/17 0503 11/12/17 0822  BP: 140/85 (!) 143/73 138/71 138/71  Pulse: 87 81 78   Resp:      Temp: 98 F (36.7 C) 99 F (37.2 C) 98.5 F (36.9 C)   TempSrc:   Oral   SpO2: 98% 96% 98%   Weight:      Height:       Filed Weights   11/06/17 1002 11/09/17 0958  Weight: 73.5 kg 73 kg   Body mass index is 29.44 kg/m.   Exam  Awake Alert, Oriented X 3, No new F.N deficits, Normal affect [place].AT,PERRAL Supple Neck,No JVD, No cervical lymphadenopathy appriciated.  Symmetrical Chest wall movement, Good air movement bilaterally, CTAB RRR,No Gallops, Rubs or new Murmurs, No Parasternal Heave +ve B.Sounds, Abd Soft, No tenderness, No organomegaly appriciated, No rebound - guarding or rigidity. 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    Basic Metabolic Panel: Recent Labs  Lab 11/08/17 0734 11/09/17 0435 11/10/17 0446 11/11/17 0408 11/12/17 0403  NA 134* 131* 134* 133* 132*  K 4.4 4.0 4.3 4.5 4.3  CL 99 99 100 102 99  CO2 29 26 27 25 27   GLUCOSE 80 105* 72 261* 242*  BUN 10 8 7 11 8   CREATININE 0.63 0.56 0.59 0.61 0.60  CALCIUM 8.2* 8.1* 8.0* 7.8* 8.0*    GFR: Estimated Creatinine Clearance: 73.5  mL/min (by C-G formula based on SCr of 0.6 mg/dL).  Liver Function Tests: Recent Labs  Lab 11/06/17 1058 11/10/17 0446  AST 17 20  ALT 16 12  ALKPHOS 84 64  BILITOT 0.4 0.8  PROT 7.4 5.6*  ALBUMIN 2.7* 1.7*   No results for input(s): LIPASE, AMYLASE in the last 168 hours. No results for input(s): AMMONIA in the last 168 hours.  Coagulation Profile: No results for input(s): INR, PROTIME in the last 168 hours.  Cardiac Enzymes: No results for input(s): CKTOTAL, CKMB, CKMBINDEX, TROPONINI in the last 168 hours.  BNP (last 3 results) No results for input(s): PROBNP in the last 8760 hours.  HbA1C: No results for input(s): HGBA1C in the last 72 hours.  CBG: Recent Labs  Lab 11/11/17 1016 11/11/17 1202 11/11/17 1548 11/11/17 2127 11/12/17 0724  GLUCAP 221* 171* 231* 254* 202*    Lipid Profile: No results for input(s): CHOL, HDL, LDLCALC, TRIG, CHOLHDL, LDLDIRECT in the last 72 hours.  Thyroid Function Tests: No results for input(s): TSH, T4TOTAL, FREET4, T3FREE, THYROIDAB in the last 72 hours.  Anemia Panel: No results for input(s): VITAMINB12, FOLATE, FERRITIN, TIBC, IRON, RETICCTPCT in the last 72 hours.  Urine analysis:    Component Value Date/Time   COLORURINE YELLOW 11/09/2017 0929   APPEARANCEUR HAZY (A) 11/09/2017 0929   LABSPEC 1.013 11/09/2017 0929   PHURINE 6.0 11/09/2017 0929   GLUCOSEU NEGATIVE 11/09/2017  0929   HGBUR LARGE (A) 11/09/2017 0929   BILIRUBINUR NEGATIVE 11/09/2017 0929   BILIRUBINUR N 04/28/2016 1603   KETONESUR NEGATIVE 11/09/2017 0929   PROTEINUR 30 (A) 11/09/2017 0929   UROBILINOGEN 0.2 04/28/2016 1603   UROBILINOGEN 0.2 06/18/2013 0650   NITRITE NEGATIVE 11/09/2017 0929   LEUKOCYTESUR NEGATIVE 11/09/2017 0929    Sepsis Labs: Lactic Acid, Venous    Component Value Date/Time   LATICACIDVEN 1.20 11/06/2017 1109    MICROBIOLOGY: Recent Results (from the past 240 hour(s))  Culture, blood (routine x 2)     Status: None   Collection Time: 11/06/17 10:23 AM  Result Value Ref Range Status   Specimen Description   Final    BLOOD LEFT ANTECUBITAL Performed at [facility], [place] [address] [place], [place] [address]    Special Requests   Final    BOTTLES DRAWN AEROBIC AND ANAEROBIC Blood Culture results may not be optimal due to an inadequate volume of blood received in culture bottles Performed at [place] [address] [place], [place] [address]    Culture   Final    NO GROWTH 5 DAYS Performed at [facility], [place] [address], [place], [place] [address]    Report Status 11/11/2017 FINAL  Final  Culture, blood (routine x 2)     Status: None   Collection Time: 11/06/17 10:28 AM  Result Value Ref Range Status   Specimen Description   Final    BLOOD RIGHT HAND Performed at [place] [address] [place], [place] [address]    Special Requests   Final    BOTTLES DRAWN AEROBIC AND ANAEROBIC Blood Culture results may not be optimal due to an inadequate volume of blood received in culture bottles Performed at [place] [address], [place], [place] [address]    Culture   Final    NO GROWTH 5 DAYS Performed at [facility], [place] [address] [place],  [address]    Report Status 11/11/2017 FINAL  Final  Surgical pcr screen     Status: Abnormal   Collection  Time: 11/07/17  6:55 PM  Result Value Ref Range Status   MRSA, PCR NEGATIVE NEGATIVE Final   Staphylococcus aureus POSITIVE (A) NEGATIVE Final    Comment: (NOTE) The Xpert SA Assay (FDA approved for NASAL specimens in patients 44 years of age and older), is one component of a comprehensive surveillance program. It is not intended to diagnose infection nor to guide or monitor treatment. Performed at [facility], [place] [address], [place], [place] [address]   Aerobic/Anaerobic Culture (surgical/deep wound)     Status: None (Preliminary result)   Collection Time: 11/09/17 12:48 PM  Result Value Ref Range Status   Specimen Description TISSUE RIGHT FOOT HEEL  Final   Special Requests SPEC IN SWABS  Final   Gram Stain   Final    ABUNDANT WBC PRESENT,BOTH PMN AND MONONUCLEAR ABUNDANT GRAM POSITIVE COCCI FEW GRAM NEGATIVE RODS    Culture   Final    MODERATE STAPHYLOCOCCUS AUREUS MODERATE UNIDENTIFIED ORGANISM HOLDING FOR POSSIBLE ANAEROBE Performed at [facility], [place] [address] [place], [place] [address]    Report Status PENDING  Incomplete   Organism ID, Bacteria STAPHYLOCOCCUS AUREUS  Final      Susceptibility   Staphylococcus aureus - MIC*    CIPROFLOXACIN <=0.5 SENSITIVE Sensitive     ERYTHROMYCIN >=8 RESISTANT Resistant     GENTAMICIN <=0.5 SENSITIVE Sensitive     OXACILLIN 0.5 SENSITIVE Sensitive     TETRACYCLINE <=1 SENSITIVE Sensitive     VANCOMYCIN <=0.5 SENSITIVE Sensitive     TRIMETH/SULFA <=10 SENSITIVE Sensitive     CLINDAMYCIN RESISTANT Resistant     RIFAMPIN <=0.5 SENSITIVE Sensitive     Inducible Clindamycin POSITIVE Resistant     * MODERATE STAPHYLOCOCCUS AUREUS    RADIOLOGY STUDIES/RESULTS: Mr Foot Right Wo Contrast  Result Date: 11/06/2017 CLINICAL DATA:  Diabetic patient with right foot pain and swelling. History of prior amputation. EXAM: MRI OF THE RIGHT FOREFOOT WITHOUT CONTRAST TECHNIQUE: Multiplanar, multisequence MR imaging of the right  forefoot was performed. No intravenous contrast was administered. COMPARISON:  Plain films right foot earlier today. FINDINGS: Bones/Joint/Cartilage The patient is status post amputation at the level of the mid diaphysis of the third metatarsal. Abnormal increased T2 and decreased T1 signal are seen in the distal 4.5 cm of the first metatarsal and throughout the proximal phalanx of the great toe. Milder degree of edema is present in the distal phalanx of the great toe. Abnormal edema and decreased T1 signal are also seen throughout the second metatarsal and proximal phalanx of the second toe. Remnant of the third metatarsal demonstrates diffusely abnormal signal. Abnormal signal is also present throughout almost the entire fourth metatarsal with sparing of the head and neck identified. Bone marrow signal is otherwise normal. Ligaments Intact. Muscles and Tendons No intramuscular fluid collection is identified. There is some atrophy of intrinsic musculature the foot. Soft tissues Diffuse and intense soft tissue edema is present about the dorsum of the foot. Somewhat milder degree of edema is seen in the distal lower leg and in the plantar soft tissues. There appears to be a skin ulceration on the plantar surface of the foot. IMPRESSION: Extensive abnormal signal in the first through fourth metatarsals and in the proximal phalanges of the first and second toes most consistent with osteomyelitis. Diffuse and intense subcutaneous edema about the foot and a milder degree of edema about the  ankle most consistent with cellulitis. Negative for abscess or septic joint. Electronically Signed   By: Inge Rise M.D.   On: 11/06/2017 15:15   Dg Foot Complete Right  Result Date: 11/06/2017 CLINICAL DATA:  Diabetic patient with a wound on the right heel. EXAM: RIGHT FOOT COMPLETE - 3+ VIEW COMPARISON:  Plain films right foot 02/22/2017. FINDINGS: Since the prior examination, the patient has undergone amputation of the  third metatarsal at the mid diaphysis. No bony destructive change or periosteal reaction is identified. Plantar calcaneal spur is noted. Small linear focus of superficial soft tissue gas is seen in the plantar soft tissues of the foot consistent with an ulceration. Severe and diffuse soft tissue edema is present about the foot. IMPRESSION: Severe, diffuse soft tissue edema about the right foot without plain film evidence of osteomyelitis. Small focus of linear gas in the superficial plantar soft tissues of the foot consistent with skin ulceration. Status post amputation at the level of the mid third metatarsal without complicating feature. Electronically Signed   By: Inge Rise M.D.   On: 11/06/2017 11:24     LOS: 6 days   Lala Lund, MD  [facility]  If 7PM-7AM, please contact night-coverage  Please page via [email or web address]-click on MD name and type text message  11/12/2017, 11:40 AM

## 2017-11-12 NOTE — Evaluation (Addendum)
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 PMH: DM, schizophrenia, HTN, L transmet amputation   Clinical Impression   PTA, pt was living alone and independent with assistive devices for ADL and functional mobility. She has used her wheelchair periodically but reports that her home is not wheelchair accessible. Pt currently requiring max assist for LB ADL, max assist for toileting hygiene, and min assist for stand-pivot toilet transfers. Educated pt concerning proper positioning of RLE. Hangar representative present at beginning of session applying limb guard and OT assisting/collaborating in applying shrinker. Pt would benefit from continued OT services while admitted to improve independence and safety with ADL and functional mobility. Recommend follow-up with OT at SNF level to maximize return to PLOF. OT will continue to follow while admitted.     Follow Up Recommendations  SNF;Supervision/Assistance - 24 hour    Equipment Recommendations  Other (comment)(defer to next venue of care)    Recommendations for Other Services       Precautions / Restrictions Precautions Precautions: Fall;Knee Precaution Booklet Issued: Yes (comment) Restrictions Weight Bearing Restrictions: No RLE Weight Bearing: Non weight bearing      Mobility Bed Mobility Overal bed mobility: Needs Assistance Bed Mobility: Supine to Sit     Supine to sit: Min guard     General bed mobility comments: Min guard for safety.   Transfers Overall transfer level: Needs assistance Equipment used: Rolling walker (2 wheeled) Transfers: Sit to/from [facility] Sit to Stand: Min assist Stand pivot transfers: Min assist       General transfer comment: Assist to power up to standing and manage RW     Balance Overall balance assessment:  Needs assistance Sitting-balance support: Single extremity supported Sitting balance-Leahy Scale: Fair     Standing balance support: Bilateral upper extremity supported Standing balance-Leahy Scale: Poor Standing balance comment: reliant on UE support                           ADL either performed or assessed with clinical judgement   ADL Overall ADL's : Needs assistance/impaired Eating/Feeding: Set up;Sitting   Grooming: Supervision/safety;Sitting   Upper Body Bathing: Supervision/ safety;Sitting   Lower Body Bathing: Maximal assistance;Sitting/lateral leans   Upper Body Dressing : Supervision/safety;Sitting   Lower Body Dressing: Maximal assistance;Sitting/lateral leans   Toilet Transfer: Stand-pivot;RW;Minimal assistance             General ADL Comments: [employer] representative present and educating pt concerning use of limb guard during session. OT assisted her in donning shrinker.      Vision Patient Visual Report: No change from baseline Vision Assessment?: No apparent visual deficits     Perception     Praxis      Pertinent Vitals/Pain Pain Assessment: Faces Faces Pain Scale: Hurts whole lot Pain Location: RLE  Pain Descriptors / Indicators: Throbbing;Operative site guarding Pain Intervention(s): Limited activity within patient's tolerance;Monitored during session;Repositioned     Hand Dominance Right   Extremity/Trunk Assessment Upper Extremity Assessment Upper Extremity Assessment: Overall WFL for tasks assessed   Lower Extremity Assessment Lower Extremity Assessment: Defer to PT evaluation   Cervical / Trunk Assessment Cervical / Trunk Assessment: Normal   Communication Communication Communication: No difficulties   Cognition Arousal/Alertness: Awake/alert Behavior During Therapy: WFL for tasks assessed/performed Overall Cognitive Status: No family/caregiver present to determine  baseline cognitive functioning Area of Impairment:  Safety/judgement;Attention                   Current Attention Level: Sustained     Safety/Judgement: Decreased awareness of safety     General Comments: Pt with decreased awareness, poor attention, and noted to be tangential at times.    General Comments       Exercises     Shoulder Instructions      Home Living Family/patient expects to be discharged to:: Private residence Living Arrangements: Alone Available Help at Discharge: Family;Available PRN/intermittently Type of Home: Apartment Home Access: Level entry     Home Layout: One level     Bathroom Shower/Tub: Tub/shower unit     Bathroom Accessibility: No   Home Equipment: Environmental consultant - 2 wheels;Bedside commode;Wheelchair - manual   Additional Comments: reports home is not wheelchair accessible      Prior Functioning/Environment Level of Independence: Independent with assistive device(s)        Comments: Using w/c often for mobility        OT Problem List: Decreased strength;Decreased range of motion;Decreased activity tolerance;Impaired balance (sitting and/or standing);Decreased safety awareness;Decreased knowledge of use of DME or AE;Decreased knowledge of precautions;Pain      OT Treatment/Interventions: Self-care/ADL training;Therapeutic exercise;Energy conservation;DME and/or AE instruction;Therapeutic activities;Patient/family education;Balance training;Cognitive remediation/compensation    OT Goals(Current goals can be found in the care plan section) Acute Rehab OT Goals Patient Stated Goal: return home OT Goal Formulation: With patient Time For Goal Achievement: 11/26/17 Potential to Achieve Goals: Good ADL Goals Pt Will Perform Grooming: with modified independence;sitting Pt Will Perform Lower Body Dressing: with min assist;sitting/lateral leans Pt Will Transfer to Toilet: with supervision;stand pivot transfer;bedside commode Pt Will Perform Toileting - Clothing Manipulation and hygiene:  with supervision;sitting/lateral leans Additional ADL Goal #1: Pt will demonstrate selective attention to seated grooming tasks with no verbal cues.  OT Frequency: Min 2X/week   Barriers to D/C:            Co-evaluation              AM-PAC PT "6 Clicks" Daily Activity     Outcome Measure Help from another person eating meals?: None Help from another person taking care of personal grooming?: None Help from another person toileting, which includes using toliet, bedpan, or urinal?: A Lot Help from another person bathing (including washing, rinsing, drying)?: A Lot Help from another person to put on and taking off regular upper body clothing?: None Help from another person to put on and taking off regular lower body clothing?: A Lot 6 Click Score: 18   End of Session Equipment Utilized During Treatment: Gait belt;Rolling walker  Activity Tolerance: Patient tolerated treatment well Patient left: in chair;with chair alarm set  OT Visit Diagnosis: Other abnormalities of gait and mobility (R26.89);Pain Pain - Right/Left: Right Pain - part of body: Leg(residual limb)                Time: 7035-0093 OT Time Calculation (min): 19 min Charges:  OT General Charges $OT Visit: 1 Visit OT Evaluation $OT Eval Moderate Complexity: [place], OTR/L [place] Pager [phone] Office [place] A Schelly Chuba 11/12/2017, 6:09 PM

## 2017-11-12 NOTE — Progress Notes (Signed)
Subjective: 1 Day Post-Op Procedure(s) (LRB): AMPUTATION BELOW KNEE (Right) Patient reports pain as moderate.    Objective: Vital signs in last 24 hours: Temp:  [97.3 F (36.3 C)-99 F (37.2 C)] 98.5 F (36.9 C) (09/02 0503) Pulse Rate:  [76-87] 78 (09/02 0503) Resp:  [12-18] 16 (09/01 1120) BP: (138-185)/(69-89) 138/71 (09/02 0503) SpO2:  [96 %-100 %] 98 % (09/02 0503)  Intake/Output from previous day: 09/01 0701 - 09/02 0700 In: 3609.6 [P.O.:200; I.V.:450; IV Piggyback:2959.6] Out: 150 [Blood:150] Intake/Output this shift: No intake/output data recorded.  Recent Labs    11/10/17 0446 11/11/17 0408 11/12/17 0403  HGB 8.8* 9.1* 7.7*   Recent Labs    11/11/17 0408 11/12/17 0403  WBC 14.7* 15.2*  RBC 3.15* 2.63*  HCT 29.3* 24.6*  PLT 402* 393   Recent Labs    11/10/17 0446 11/11/17 0408  NA 134* 133*  K 4.3 4.5  CL 100 102  CO2 27 25  BUN 7 11  CREATININE 0.59 0.61  GLUCOSE 72 261*  CALCIUM 8.0* 7.8*   No results for input(s): LABPT, INR in the last 72 hours.  VAC dressing in place and scant serous drainage in canister.     Assessment/Plan: 1 Day Post-Op Procedure(s) (LRB): AMPUTATION BELOW KNEE (Right) Up with therapy    [employer] [phone]

## 2017-11-13 LAB — BASIC METABOLIC PANEL
Anion gap: 4 — ABNORMAL LOW (ref 5–15)
BUN: 7 mg/dL (ref 6–20)
CHLORIDE: 101 mmol/L (ref 98–111)
CO2: 27 mmol/L (ref 22–32)
Calcium: 8.1 mg/dL — ABNORMAL LOW (ref 8.9–10.3)
Creatinine, Ser: 0.49 mg/dL (ref 0.44–1.00)
GFR calc Af Amer: >60 mL/min (ref >60)
GFR calc non Af Amer: >60 mL/min (ref >60)
Glucose, Bld: 202 mg/dL — ABNORMAL HIGH (ref 70–99)
POTASSIUM: 4.3 mmol/L (ref 3.5–5.1)
Sodium: 132 mmol/L — ABNORMAL LOW (ref 135–145)

## 2017-11-13 LAB — CBC
HEMATOCRIT: 25.1 % — AB (ref 36.0–46.0)
Hemoglobin: 7.8 g/dL — ABNORMAL LOW (ref 12.0–15.0)
MCH: 29 pg (ref 26.0–34.0)
MCHC: 31.1 g/dL (ref 30.0–36.0)
MCV: 93.3 fL (ref 78.0–100.0)
Platelets: 424 10*3/uL — ABNORMAL HIGH (ref 150–400)
RBC: 2.69 MIL/uL — AB (ref 3.87–5.11)
RDW: 13.7 % (ref 11.5–15.5)
WBC: 16 10*3/uL — ABNORMAL HIGH (ref 4.0–10.5)

## 2017-11-13 LAB — GLUCOSE, CAPILLARY
GLUCOSE-CAPILLARY: 330 mg/dL — AB (ref 70–99)
Glucose-Capillary: 147 mg/dL — ABNORMAL HIGH (ref 70–99)
Glucose-Capillary: 152 mg/dL — ABNORMAL HIGH (ref 70–99)
Glucose-Capillary: 199 mg/dL — ABNORMAL HIGH (ref 70–99)

## 2017-11-13 MED ORDER — INSULIN ASPART 100 UNIT/ML ~~LOC~~ SOLN: SUBCUTANEOUS | 12 refills | Status: DC

## 2017-11-13 MED ORDER — AMLODIPINE BESYLATE 10 MG PO TABS: 10.0000 mg | ORAL_TABLET | Freq: Every day | ORAL | Status: DC

## 2017-11-13 MED ORDER — OXYCODONE HCL 5 MG PO TABS: 5.0000 mg | ORAL_TABLET | Freq: Four times a day (QID) | ORAL | 0 refills | Status: DC | PRN

## 2017-11-13 MED ORDER — AMLODIPINE BESYLATE 10 MG PO TABS
10.0000 mg | ORAL_TABLET | Freq: Every day | ORAL | Status: DC
  Administered 2017-11-13 – 2017-11-16 (×4): 10 mg via ORAL
  Filled 2017-11-13 (×4): qty 1

## 2017-11-13 NOTE — Progress Notes (Signed)
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 PMH: DM, schizophrenia, HTN, L transmet amputation    PT Comments    Pt progressing well, practiced transfers with and without use of RW. Pt also worked on standing on LLE for longer time within RW. Pt still does not have L transmet shoe from home, emphasized to her the importance of this and spoke with her daughter on the phone.  Pt performed amputee exercises in supine, SL, and sitting. PT will continue to follow.   Follow Up Recommendations  SNF;Supervision for mobility/OOB     Equipment Recommendations  None recommended by PT    Recommendations for Other Services       Precautions / Restrictions Precautions Precautions: Fall;Knee Precaution Booklet Issued: No Precaution Comments: went over proper positioning off knee again to prepare for prosthesis in the future Restrictions Weight Bearing Restrictions: No RLE Weight Bearing: Non weight bearing Other Position/Activity Restrictions: wound vac    Mobility  Bed Mobility Overal bed mobility: Needs Assistance Bed Mobility: Supine to Sit     Supine to sit: Supervision     General bed mobility comments: pt able to get self to EOB without physical assist today, supervision for safety and mgmt of lines  Transfers Overall transfer level: Needs assistance Equipment used: Rolling walker (2 wheeled);None Transfers: Sit to/from [employer] to Stand: Min assist Stand pivot transfers: Min assist       General transfer comment: pt performed SPT to [facility] without AD with min A. Then stood to RW with min A and performed pivot to recliner. Better balance today in unilateral stance  Ambulation/Gait Ambulation/Gait assistance: Min assist Gait Distance (Feet): 2 Feet Assistive device: Rolling walker (2 wheeled)        General Gait Details: several small hops. Family still working on bringing her transmet shoe (I spoke with her dtr by phone today) so did not let her go further   Stairs             Wheelchair Mobility    Modified Rankin (Stroke Patients Only)       Balance Overall balance assessment: Needs assistance Sitting-balance support: Single extremity supported Sitting balance-Leahy Scale: Fair Sitting balance - Comments: pt beginning to adjust to wt change RLE   Standing balance support: Bilateral upper extremity supported Standing balance-Leahy Scale: Poor Standing balance comment: reliant on UE support, but able to maintain standing in RW for 3 mins                            Cognition Arousal/Alertness: Awake/alert Behavior During Therapy: WFL for tasks assessed/performed Overall Cognitive Status: Within Functional Limits for tasks assessed                                        Exercises Amputee Exercises Quad Sets: AROM;Both;10 reps;Supine Gluteal Sets: AROM;Both;10 reps;Supine Hip Extension: AROM;Right;10 reps;Sidelying Hip ABduction/ADduction: AROM;Right;10 reps;Sidelying Knee Flexion: AROM;Right;10 reps;Supine Knee Extension: AROM;Right;10 reps;Supine Straight Leg Raises: AROM;Right;5 reps;Supine    General Comments        Pertinent Vitals/Pain Pain Assessment: Faces Faces Pain Scale: Hurts even more Pain Location: RLE  Pain Descriptors / Indicators: Throbbing;Operative site guarding Pain Intervention(s): Limited activity within patient's tolerance;Monitored  during session    Home Living                      Prior Function            PT Goals (current goals can now be found in the care plan section) Acute Rehab PT Goals Patient Stated Goal: return home PT Goal Formulation: With patient Time For Goal Achievement: 11/26/17 Potential to Achieve Goals: Good Progress towards PT goals: Progressing toward  goals    Frequency    Min 3X/week      PT Plan Current plan remains appropriate    Co-evaluation              AM-PAC PT "6 Clicks" Daily Activity  Outcome Measure  Difficulty turning over in bed (including adjusting bedclothes, sheets and blankets)?: A Little Difficulty moving from lying on back to sitting on the side of the bed? : A Little Difficulty sitting down on and standing up from a chair with arms (e.g., wheelchair, bedside commode, etc,.)?: Unable Help needed moving to and from a bed to chair (including a wheelchair)?: A Little Help needed walking in hospital room?: A Lot Help needed climbing 3-5 steps with a railing? : Total 6 Click Score: 13    End of Session Equipment Utilized During Treatment: Gait belt Activity Tolerance: Patient tolerated treatment well Patient left: in chair;with call bell/phone within reach Nurse Communication: Mobility status PT Visit Diagnosis: Unsteadiness on feet (R26.81);Pain;Difficulty in walking, not elsewhere classified (R26.2) Pain - Right/Left: Right Pain - part of body: Leg     Time: 9509-3267 PT Time Calculation (min) (ACUTE ONLY): 34 min  Charges:  $Therapeutic Exercise: 8-22 mins $Therapeutic Activity: 8-22 mins                     Leighton Roach, PT  Acute Rehab Services  [place] 11/13/2017, 10:44 AM

## 2017-11-13 NOTE — Discharge Summary (Signed)
Gina [name] KDT:[id number] DOB: [birth year] DOA: 11/06/2017  PCP: Alfonse Spruce, FNP  Admit date: 11/06/2017  Discharge date: 11/13/2017  Admitted From: Home   Disposition:  SNF   Recommendations for Outpatient Follow-up:   Follow up with PCP in 1-2 weeks  PCP Please obtain BMP/CBC, 2 view CXR in 1week,  (see Discharge instructions)   PCP Please follow up on the following pending results:    Home Health: None   Equipment/Devices: None  Consultations: Ortho Discharge Condition: Stable   CODE STATUS: Full   Diet Recommendation: Heart Healthy - Low Carb   Chief Complaint  Patient presents with  . possible R foot infection     Brief history of present illness from the day of admission and additional interim summary    Patient is a 57 y.o. 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  Evaluated by Dr. Sharol Given, plan is for irrigation and debridement on 8/30.                                                                 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  Seen by Ortho Dr. 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   She is now close to her baseline, stopped all ABX, OK by Ortho Dr. Sharol Given, she will go with portable wound VAC to SNF.  Mild perioperative blood loss related anemia. Follow Dr. Sharol Given in a week post discharge.  Insulin-dependent DM-2:  Adjusted Lantus and ISS, monitor and adjust at SNF. Poor outpt control.  Lab Results  Component Value Date   HGBA1C 12.9 (H) 10/04/2017   CBG (last 3)  Recent Labs    11/12/17 2123 11/13/17 0629  11/13/17 1215  GLUCAP 244* 147* 152*     Hypertension: Controlled, continue lisinopril and added Norvasc for better control.  Normocytic anemia: Probably related to chronic disease-hemoglobin stable for close follow-up.  On iron supplementation.  Peripheral neuropathy: Probably related to diabetes-continue Neurontin  Schizophrenia: Appears to be stable, continue Lexapro  Discharge diagnosis     Principal Problem:   Cellulitis and abscess of foot Active Problems:   HTN (hypertension)   Moderate protein-calorie malnutrition (HCC)   DM (diabetes mellitus), type 2, uncontrolled, periph vascular complic (HCC)   Anemia of chronic disease   S/P transmetatarsal amputation of foot, left ([place])   Schizophrenia [facility])    Discharge instructions    Discharge Instructions    Discharge instructions   Complete by:  As directed    Follow with Primary MD Alfonse Spruce, FNP in 7 days   Get CBC, CMP checked  by Primary MD or SNF MD in 5-7 days  Activity: As tolerated with Full fall precautions use walker/cane & assistance as needed  Disposition SNF  Diet:   Low Carb Heart Healthy , CBGs QA CHS at SNF.  For Heart failure patients - Check your Weight same time everyday, if you gain over 2 pounds, or you develop in leg swelling, experience more shortness of breath or chest pain, call your Primary MD immediately. Follow Cardiac Low Salt Diet and 1.5 lit/day fluid restriction.  Special Instructions: If you have smoked or chewed Tobacco  in the last 2 yrs please stop smoking, stop any regular Alcohol  and or any Recreational drug use.  On your next visit with your primary care physician please Get Medicines reviewed and adjusted.  Please request your Prim.MD to go over all Hospital Tests and Procedure/Radiological results at the follow up, please get all Hospital records sent to your Prim MD by signing hospital release before you go home.  If you experience worsening of your  admission symptoms, develop shortness of breath, life threatening emergency, suicidal or homicidal thoughts you must seek medical attention immediately by calling 911 or calling your MD immediately  if symptoms less severe.   Increase activity slowly   Complete by:  As directed    Negative Pressure Wound Therapy - Incisional   Complete by:  As directed    Attached to Praveena plus portable wound VAC anticipate 2 weeks of therapy   Negative Pressure Wound Therapy - Incisional   Complete by:  As directed    Attach dressing to the portable Praveena plus wound VAC pump for discharge.      Discharge Medications   Allergies as of 11/13/2017      Reactions   Metformin And Related Other (See Comments)   Upset stomach      Medication List    STOP taking these medications   doxycycline 100 MG tablet Commonly known as:  VIBRA-TABS     TAKE these medications   ACCU-CHEK AVIVA PLUS w/Device Kit Use as directed TID. E11.9   accu-chek soft touch lancets Use as instructed   acetaminophen 325 MG tablet Commonly known as:  TYLENOL Take 2 tablets (650 mg total) by mouth every 6 (six) hours as needed for mild pain (or Fever >/= 101).   amLODipine 10 MG tablet Commonly known as:  NORVASC Take 1 tablet (10 mg total) by mouth daily.   bisacodyl 10 MG suppository Commonly known as:  DULCOLAX Place 1 suppository (10 mg total) rectally daily as needed for moderate constipation.   blood glucose meter kit and supplies Kit Dispense based on patient and insurance preference. Use up to four times daily as directed. (FOR ICD-9 250.00, 250.01).   docusate sodium 100 MG capsule Commonly known as:  COLACE Take 1 capsule (100 mg total) by mouth 2 (two) times daily.   escitalopram 20 MG tablet Commonly known as:  LEXAPRO Take 1 tablet (20 mg total) by mouth daily.   ferrous sulfate 325 (65 FE) MG tablet Take 1 tablet (325 mg total) by mouth 2 (two) times daily with a meal.   furosemide 20 MG  tablet Commonly known as:  LASIX Take 1 daily for swelling   gabapentin 300 MG capsule Commonly known as:  NEURONTIN Take 1 capsule (300 mg total) by mouth at bedtime.   glucose blood test strip Use as instructed   insulin aspart 100 UNIT/ML injection Commonly known as:  novoLOG Before each meal  3 times a day, 140-199 - 2 units, 200-250 - 4 units, 251-299 - 6 units,  300-349 - 8 units,  350 or above 10 units. Dispense syringes and needles as needed, Ok to switch to PEN if approved. Substitute to any brand approved. DX DM2, Code E11.65   Insulin Glargine 100 UNIT/ML Solostar Pen Commonly known as:  LANTUS INJECT 30 UNITS INTO THE SKIN DAILY AT 10 PM.   Insulin Pen Needle 32G X 4 MM Misc Use as directed with lantus   Insulin Syringe-Needle U-100 31G X 15/64" 0.5 ML Misc Use as directed   lisinopril 5 MG tablet Commonly known as:  PRINIVIL,ZESTRIL Take 1 tablet (5 mg total) by mouth daily.   oxyCODONE 5 MG immediate release tablet Commonly known as:  Oxy IR/ROXICODONE Take 1 tablet (5 mg total) by mouth every 6 (six) hours as needed for moderate pain (pain score 4-6).   polyethylene glycol packet Commonly known as:  MIRALAX / GLYCOLAX Take 17 g by mouth daily as needed for mild constipation.   senna-docusate 8.6-50 MG tablet Commonly known as:  Senokot-S Take 2 tablets by mouth daily.   sitaGLIPtin 100 MG tablet Commonly known as:  JANUVIA Take 1 tablet (100 mg total) by mouth daily.       Follow-up Information    [name], MD In 1 week.   Specialty:  Orthopedic Surgery Contact information: [place] [address] [phone]        Alfonse Spruce, FNP. Schedule an appointment as soon as possible for a visit in 1 week(s).   Specialty:  Family Medicine Contact information: [place]  [address] [phone]           Major procedures and Radiology Reports - PLEASE review detailed and final reports  thoroughly  -         Mr Foot Right Wo Contrast  Result Date: 11/06/2017 CLINICAL DATA:  Diabetic patient with right foot pain and swelling. History of prior amputation. EXAM: MRI OF THE RIGHT FOREFOOT WITHOUT CONTRAST TECHNIQUE: Multiplanar, multisequence MR imaging of the right forefoot was performed. No intravenous contrast was administered. COMPARISON:  Plain films right foot earlier today. FINDINGS: Bones/Joint/Cartilage The patient is status post amputation at the level of the mid diaphysis of the third metatarsal. Abnormal increased T2 and decreased T1 signal are seen in the distal 4.5 cm of the first metatarsal and throughout the proximal phalanx of the great toe. Milder degree of edema is present in the distal phalanx of the great toe. Abnormal edema and decreased T1 signal are also seen throughout the second metatarsal and proximal phalanx of the second toe. Remnant of the third metatarsal demonstrates diffusely abnormal signal. Abnormal signal is also present throughout almost the entire fourth metatarsal with sparing of the head and neck identified. Bone marrow signal is otherwise normal. Ligaments Intact. Muscles and Tendons No intramuscular fluid collection is identified. There is some atrophy of intrinsic musculature the foot. Soft tissues Diffuse and intense soft tissue edema is present about the dorsum of the foot. Somewhat milder degree of edema is seen in the distal lower leg and in the plantar soft tissues. There appears to be a skin ulceration on the plantar surface of the foot. IMPRESSION: Extensive abnormal signal in the first through fourth metatarsals and in the proximal phalanges of the first and second toes most consistent with osteomyelitis. Diffuse and intense subcutaneous edema about the foot and a milder degree of edema  about the ankle most consistent with cellulitis. Negative for abscess or septic joint. Electronically Signed   By: Inge Rise M.D.   On: 11/06/2017 15:15     Dg Foot Complete Right  Result Date: 11/06/2017 CLINICAL DATA:  Diabetic patient with a wound on the right heel. EXAM: RIGHT FOOT COMPLETE - 3+ VIEW COMPARISON:  Plain films right foot 02/22/2017. FINDINGS: Since the prior examination, the patient has undergone amputation of the third metatarsal at the mid diaphysis. No bony destructive change or periosteal reaction is identified. Plantar calcaneal spur is noted. Small linear focus of superficial soft tissue gas is seen in the plantar soft tissues of the foot consistent with an ulceration. Severe and diffuse soft tissue edema is present about the foot. IMPRESSION: Severe, diffuse soft tissue edema about the right foot without plain film evidence of osteomyelitis. Small focus of linear gas in the superficial plantar soft tissues of the foot consistent with skin ulceration. Status post amputation at the level of the mid third metatarsal without complicating feature. Electronically Signed   By: Inge Rise M.D.   On: 11/06/2017 11:24    Micro Results     Recent Results (from the past 240 hour(s))  Culture, blood (routine x 2)     Status: None   Collection Time: 11/06/17 10:23 AM  Result Value Ref Range Status   Specimen Description   Final    BLOOD LEFT ANTECUBITAL Performed at [facility] [address], [place], [place] [address]    Special Requests   Final    BOTTLES DRAWN AEROBIC AND ANAEROBIC Blood Culture results may not be optimal due to an inadequate volume of blood received in culture bottles Performed at [facility] [address] [place], [place] [address]    Culture   Final    NO GROWTH 5 DAYS Performed at [facility], [place] [address] [place], [place] [address]    Report Status 11/11/2017 FINAL  Final  Culture, blood (routine x 2)     Status: None   Collection Time: 11/06/17 10:28 AM  Result Value Ref Range Status   Specimen Description   Final    BLOOD RIGHT  HAND Performed at [facility] [address], [place], [place] [address]    Special Requests   Final    BOTTLES DRAWN AEROBIC AND ANAEROBIC Blood Culture results may not be optimal due to an inadequate volume of blood received in culture bottles Performed at [facility] [address], [place], [place] [address]    Culture   Final    NO GROWTH 5 DAYS Performed at [facility], [place] [address] [place], [place] [address]    Report Status 11/11/2017 FINAL  Final  Surgical pcr screen     Status: Abnormal   Collection Time: 11/07/17  6:55 PM  Result Value Ref Range Status   MRSA, PCR NEGATIVE NEGATIVE Final   Staphylococcus aureus POSITIVE (A) NEGATIVE Final    Comment: (NOTE) The Xpert SA Assay (FDA approved for NASAL specimens in patients 40 years of age and older), is one component of a comprehensive surveillance program. It is not intended to diagnose infection nor to guide or monitor treatment. Performed at [facility], [place] [address], [place], [place] [address]   Aerobic/Anaerobic Culture (surgical/deep wound)     Status: None (Preliminary result)   Collection Time: 11/09/17 12:48 PM  Result Value Ref Range Status  Specimen Description TISSUE RIGHT FOOT HEEL  Final   Special Requests SPEC IN SWABS  Final   Gram Stain   Final    ABUNDANT WBC PRESENT,BOTH PMN AND MONONUCLEAR ABUNDANT GRAM POSITIVE COCCI FEW GRAM NEGATIVE RODS    Culture   Final    MODERATE STAPHYLOCOCCUS AUREUS MODERATE GROUP B STREP(S.AGALACTIAE)ISOLATED TESTING AGAINST S. AGALACTIAE NOT ROUTINELY PERFORMED DUE TO PREDICTABILITY OF AMP/PEN/VAN SUSCEPTIBILITY. MODERATE BACTEROIDES FRAGILIS BETA LACTAMASE POSITIVE Performed at [facility], [place] [address] [place], [place] [address]    Report Status PENDING  Incomplete   Organism ID, Bacteria STAPHYLOCOCCUS AUREUS  Final      Susceptibility   Staphylococcus aureus - MIC*    CIPROFLOXACIN <=0.5  SENSITIVE Sensitive     ERYTHROMYCIN >=8 RESISTANT Resistant     GENTAMICIN <=0.5 SENSITIVE Sensitive     OXACILLIN 0.5 SENSITIVE Sensitive     TETRACYCLINE <=1 SENSITIVE Sensitive     VANCOMYCIN <=0.5 SENSITIVE Sensitive     TRIMETH/SULFA <=10 SENSITIVE Sensitive     CLINDAMYCIN RESISTANT Resistant     RIFAMPIN <=0.5 SENSITIVE Sensitive     Inducible Clindamycin POSITIVE Resistant     * MODERATE STAPHYLOCOCCUS AUREUS    Today   Subjective    Gina [name] today has no headache,no chest abdominal pain,no new weakness tingling or numbness, feels much better    Objective   Blood pressure (!) 171/83, pulse 80, temperature 98.7 F (37.1 C), temperature source Oral, resp. rate 16, height 5' 2"  (1.575 m), weight 73 kg, last menstrual period 03/29/2015, SpO2 100 %.   Intake/Output Summary (Last 24 hours) at 11/13/2017 1241 Last data filed at 11/13/2017 0700 Gross per 24 hour  Intake 360 ml  Output 1000 ml  Net -640 ml    Exam Awake Alert, Oriented x 3, No new F.N deficits, Normal affect [place].AT,PERRAL Supple Neck,No JVD, No cervical lymphadenopathy appriciated.  Symmetrical Chest wall movement, Good air movement bilaterally, CTAB RRR,No Gallops,Rubs or new Murmurs, No Parasternal Heave +ve B.Sounds, Abd Soft, Non tender, No organomegaly appriciated, No rebound -guarding or rigidity. No Cyanosis, old left transmetatarsal amputation, right BKA new with stump under bandage and with wound VAC   Data Review   CBC w Diff:  Lab Results  Component Value Date   WBC 16.0 (H) 11/13/2017   HGB 7.8 (L) 11/13/2017   HGB 10.2 (L) 11/01/2017   HCT 25.1 (L) 11/13/2017   HCT 31.3 (L) 11/01/2017   PLT 424 (H) 11/13/2017   PLT 257 11/01/2017   LYMPHOPCT 10 11/06/2017   MONOPCT 10 11/06/2017   EOSPCT 0 11/06/2017   BASOPCT 0 11/06/2017    CMP:  Lab Results  Component Value Date   NA 132 (L) 11/13/2017   NA 139 10/04/2017   K 4.3 11/13/2017   CL 101 11/13/2017   CO2 27 11/13/2017     BUN 7 11/13/2017   BUN 11 10/04/2017   CREATININE 0.49 11/13/2017   CREATININE 0.68 04/28/2016   PROT 5.6 (L) 11/10/2017   PROT 7.1 10/04/2017   ALBUMIN 1.7 (L) 11/10/2017   ALBUMIN 3.7 10/04/2017   BILITOT 0.8 11/10/2017   BILITOT <0.2 10/04/2017   ALKPHOS 64 11/10/2017   AST 20 11/10/2017   ALT 12 11/10/2017  .   Total Time in preparing paper work, data evaluation and todays exam - 19 minutes  Lala Lund M.D on 11/13/2017 at 12:41 PM  [facility]   Office  [phone]

## 2017-11-13 NOTE — Discharge Instructions (Signed)
Follow with Primary MD Alfonse Spruce, FNP in 7 days   Get CBC, CMP checked  by Primary MD or SNF MD in 5-7 days  Activity: As tolerated with Full fall precautions use walker/cane & assistance as needed  Disposition SNF  Diet:   Low Carb Heart Healthy , CBGs QA CHS at SNF.  For Heart failure patients - Check your Weight same time everyday, if you gain over 2 pounds, or you develop in leg swelling, experience more shortness of breath or chest pain, call your Primary MD immediately. Follow Cardiac Low Salt Diet and 1.5 lit/day fluid restriction.  Special Instructions: If you have smoked or chewed Tobacco  in the last 2 yrs please stop smoking, stop any regular Alcohol  and or any Recreational drug use.  On your next visit with your primary care physician please Get Medicines reviewed and adjusted.  Please request your Prim.MD to go over all Hospital Tests and Procedure/Radiological results at the follow up, please get all Hospital records sent to your Prim MD by signing hospital release before you go home.  If you experience worsening of your admission symptoms, develop shortness of breath, life threatening emergency, suicidal or homicidal thoughts you must seek medical attention immediately by calling 911 or calling your MD immediately  if symptoms less severe.

## 2017-11-13 NOTE — Progress Notes (Addendum)
Patient ID: Gina [name], female   DOB: 07-Jul-1960, 57 y.o.   MRN: [id number] Patient is status post transtibial amputation.  She is comfortable this morning without complaints.  Patient was given instructions on working on knee extension exercises.  Again reinforced the importance of discharge to skilled nursing.  Patient is agreeable at this time.  I will follow-up in the office in 1 week.  Discharge with the Praveena plus portable wound VAC pump.  There is no drainage in the wound VAC canister.

## 2017-11-14 LAB — AEROBIC/ANAEROBIC CULTURE (SURGICAL/DEEP WOUND)

## 2017-11-14 LAB — GLUCOSE, CAPILLARY
GLUCOSE-CAPILLARY: 147 mg/dL — AB (ref 70–99)
GLUCOSE-CAPILLARY: 169 mg/dL — AB (ref 70–99)
Glucose-Capillary: 191 mg/dL — ABNORMAL HIGH (ref 70–99)
Glucose-Capillary: 244 mg/dL — ABNORMAL HIGH (ref 70–99)

## 2017-11-14 LAB — AEROBIC/ANAEROBIC CULTURE W GRAM STAIN (SURGICAL/DEEP WOUND)

## 2017-11-14 NOTE — Progress Notes (Signed)
Patient is medically stable, she denies acute changes, son at bedside Awaiting for placement.

## 2017-11-15 LAB — GLUCOSE, CAPILLARY
GLUCOSE-CAPILLARY: 128 mg/dL — AB (ref 70–99)
Glucose-Capillary: 135 mg/dL — ABNORMAL HIGH (ref 70–99)
Glucose-Capillary: 159 mg/dL — ABNORMAL HIGH (ref 70–99)
Glucose-Capillary: 212 mg/dL — ABNORMAL HIGH (ref 70–99)

## 2017-11-15 NOTE — Progress Notes (Signed)
PT Cancellation Note  Patient Details Name: Myeasha Ballowe MRN: [id number] DOB: 16-Oct-1960   Cancelled Treatment:    Reason Eval/Treat Not Completed: Patient declined, no reason specified(Pt sitting in bed on arrival and refused PT session as she wants to conserve energy for transfer to rehab.  )   Cristela Blue 11/15/2017, 4:06 PM  Governor Rooks, PTA Acute Rehabilitation Services Pager [phone] Office [phone]

## 2017-11-15 NOTE — Progress Notes (Signed)
CSW spoke with Freda Munro with [facility] does not have bed available for today and unable to accept patient. Patient has no other bed offers at this time. [facility] states they will be able to accept patient for tomorrow 9/6.   Kingsley Spittle, [place] Clinical Social Worker [phone]

## 2017-11-15 NOTE — Progress Notes (Signed)
Pt pleasantly refusing PIV stick. RN states no meds or IV fluids ordered at this time. Pt for discharge tomorrow.

## 2017-11-15 NOTE — Progress Notes (Signed)
Per Erlinda Hong, MD, Praveena wound vac in place prior to d/c and in working order. Will continue to monitor.

## 2017-11-15 NOTE — Plan of Care (Signed)
  Problem: Education: Goal: Knowledge of General Education information will improve Description Including pain rating scale, medication(s)/side effects and non-pharmacologic comfort measures  11/15/2017 2358 by Darleen Crocker, RN Outcome: Progressing 11/15/2017 2358 by Darleen Crocker, RN Reactivated

## 2017-11-15 NOTE — Plan of Care (Signed)
Problem: Education: Goal: Knowledge of General Education information will improve Description Including pain rating scale, medication(s)/side effects and non-pharmacologic comfort measures Outcome: Progressing   Problem: Clinical Measurements: Goal: Ability to maintain clinical measurements within normal limits will improve Outcome: Progressing Goal: Respiratory complications will improve Outcome: Progressing   Problem: Nutrition: Goal: Adequate nutrition will be maintained Outcome: Progressing   Problem: Coping: Goal: Level of anxiety will decrease Outcome: Progressing   Problem: Safety: Goal: Ability to remain free from injury will improve Outcome: Progressing   Problem: Skin Integrity: Goal: Skin integrity will improve Outcome: Progressing

## 2017-11-15 NOTE — Progress Notes (Signed)
Discharge summary is done by Dr Candiss Norse on 9/3 Patient condition has been stable, unchanged. No addendum needed expect discharge date is 9/5 instead of 9/3.

## 2017-11-15 NOTE — Clinical Social Work Placement (Signed)
   CLINICAL SOCIAL WORK PLACEMENT  NOTE  Date:  11/15/2017  Patient Details  Name: Gina [name] MRN: [id number] Date of Birth: 02-11-1961  Clinical Social Work is seeking post-discharge placement for this patient at the [place] level of care (*CSW will initial, date and re-position this form in  chart as items are completed):  Yes   Patient/family provided with [facility] Work Department's list of facilities offering this level of care within the geographic area requested by the patient (or if unable, by the patient's family).  Yes   Patient/family informed of their freedom to choose among providers that offer the needed level of care, that participate in Medicare, Medicaid or managed care program needed by the patient, have an available bed and are willing to accept the patient.  Yes   Patient/family informed of [facility]'s ownership interest in [facility] and [facility], as well as of the fact that they are under no obligation to receive care at these facilities.  PASRR submitted to EDS on       PASRR number received on 11/15/17     Existing PASRR number confirmed on       FL2 transmitted to all facilities in geographic area requested by pt/family on       FL2 transmitted to all facilities within larger geographic area on       Patient informed that his/her managed care company has contracts with or will negotiate with certain facilities, including the following:        Yes   Patient/family informed of bed offers received.  Patient chooses bed at [facility]     Physician recommends and patient chooses bed at      Patient to be [facility] on 11[facility]15/17.  Patient to be transferred to facility by [facility]     Patient family notified on 11/15/17 of transfer.  Name of family member notified:  Sonya     PHYSICIAN       Additional Comment:    _______________________________________________ Vinie Sill,  [place] 11/15/2017, 12:35 PM

## 2017-11-16 LAB — GLUCOSE, CAPILLARY
Glucose-Capillary: 106 mg/dL — ABNORMAL HIGH (ref 70–99)
Glucose-Capillary: 163 mg/dL — ABNORMAL HIGH (ref 70–99)

## 2017-11-16 NOTE — Progress Notes (Signed)
Discharge summary is done by Dr Candiss Norse on 9/3 Patient condition has been stable, unchanged. No addendum needed expect discharge date is 9/6 instead of 9/3.  Wound wac settings:  187mm/hg, continuous Do not remove dressing  F/u with Dr Sharol Given in one week.

## 2017-11-16 NOTE — Clinical Social Work Placement (Signed)
   CLINICAL SOCIAL WORK PLACEMENT  NOTE  Date:  11/16/2017  Patient Details  Name: Gina [name] MRN: [id number] Date of Birth: 16-May-1960  Clinical Social Work is seeking post-discharge placement for this patient at the [place] level of care (*CSW will initial, date and re-position this form in  chart as items are completed):  Yes   Patient/family provided with [facility] Work Department's list of facilities offering this level of care within the geographic area requested by the patient (or if unable, by the patient's family).  Yes   Patient/family informed of their freedom to choose among providers that offer the needed level of care, that participate in Medicare, Medicaid or managed care program needed by the patient, have an available bed and are willing to accept the patient.  Yes   Patient/family informed of [place]'s ownership interest in [facility] and [facility], as well as of the fact that they are under no obligation to receive care at these facilities.  PASRR submitted to EDS on       PASRR number received on 11/15/17     Existing PASRR number confirmed on       FL2 transmitted to all facilities in geographic area requested by pt/family on       FL2 transmitted to all facilities within larger geographic area on       Patient informed that his/her managed care company has contracts with or will negotiate with certain facilities, including the following:        Yes   Patient/family informed of bed offers received.  Patient chooses bed at [facility]     Physician recommends and patient chooses bed at      Patient to be transferred to [facility] on 11/16/17.  Patient to be transferred to facility by [facility]     Patient family notified on 11/16/17 of transfer.  Name of family member notified:  Sonya     PHYSICIAN       Additional Comment:    _______________________________________________ Eileen Stanford,  LCSW 11/16/2017, 10:59 AM

## 2017-11-16 NOTE — Clinical Social Work Note (Signed)
Facility prepared to take pt today. MD put in note stating no changes to d/c summary. Note faxed to facility.  [place], [place]

## 2017-11-16 NOTE — Clinical Social Work Note (Signed)
Clinical Social Worker facilitated patient discharge including contacting patient family and facility to confirm patient discharge plans.  Clinical information faxed to facility and family agreeable with plan.  CSW arranged ambulance transport via [place] to [facility].  RN to call [phone] for report prior to discharge.  Clinical Social Worker will sign off for now as social work intervention is no longer needed. Please consult [place] again if new need arises.  [place], [place]

## 2017-11-21 ENCOUNTER — Other Ambulatory Visit: Payer: Self-pay | Admitting: Physician Assistant

## 2017-11-21 ENCOUNTER — Other Ambulatory Visit: Payer: Self-pay | Admitting: Family Medicine

## 2017-11-21 DIAGNOSIS — Z794 Long term (current) use of insulin: Principal | ICD-10-CM

## 2017-11-21 DIAGNOSIS — R609 Edema, unspecified: Secondary | ICD-10-CM

## 2017-11-21 DIAGNOSIS — E1165 Type 2 diabetes mellitus with hyperglycemia: Secondary | ICD-10-CM

## 2017-11-22 ENCOUNTER — Other Ambulatory Visit: Payer: Self-pay | Admitting: Family Medicine

## 2017-11-22 ENCOUNTER — Ambulatory Visit (INDEPENDENT_AMBULATORY_CARE_PROVIDER_SITE_OTHER): Payer: Medicaid Other | Admitting: Physician Assistant

## 2017-11-22 ENCOUNTER — Encounter (INDEPENDENT_AMBULATORY_CARE_PROVIDER_SITE_OTHER): Payer: Self-pay | Admitting: Physician Assistant

## 2017-11-22 VITALS — Ht 62.0 in | Wt 160.9 lb

## 2017-11-22 DIAGNOSIS — E1165 Type 2 diabetes mellitus with hyperglycemia: Secondary | ICD-10-CM

## 2017-11-22 DIAGNOSIS — Z89511 Acquired absence of right leg below knee: Secondary | ICD-10-CM

## 2017-11-22 DIAGNOSIS — Z794 Long term (current) use of insulin: Principal | ICD-10-CM

## 2017-11-22 DIAGNOSIS — Z89432 Acquired absence of left foot: Secondary | ICD-10-CM

## 2017-11-22 NOTE — Progress Notes (Signed)
Office Visit Note   Patient: Gina [name]           Date of Birth: 01-25-1961           MRN: [id number] Visit Date: 11/22/2017              Requested by: Alfonse Spruce, FNP No address on file PCP: Alfonse Spruce, FNP  Chief Complaint  Patient presents with  . Right Leg - Routine Post Op    Wound Vac removed      HPI: The patient is a 57 year old female who is seen for postoperative follow-up following a right transtibial amputation on 11/11/2017.  She had a previous left foot transmetatarsal amputation and had previously been ambulating with a left AFO with partial foot and toe spacer.  Check from [facility] clinic is here today to assist the patient in fitting of the left AFO with her custom shoe with foot and toe spacer as the nursing facility, [facility] skilled nursing facility, was not sure how to don the brace.  Patient is reporting no pain in the right below the knee amputation.  She did have the Praveena plus VAC on and this was removed today.  Assessment & Plan: Visit Diagnoses:  1. Acquired absence of right lower extremity below knee ([place])   2. S/P transmetatarsal amputation of foot, left ([place])     Plan: The Praveena plus VAC was removed and the patient tolerated this well.  Just from [place] was present today for her visit and instructed the patient and how to don her left AFO with custom shoe with partial foot and toe spacer.  She may eventually require a higher top shoe for ankle stabilization and will see how she does as she becomes more mobile following her right transtibial amputation.  We are going to leave her staples in place.  They should apply Silvadene to the incisional line where she has a small blister and 4 x 4's Ace wrap and then stump shrinker daily after cleansing with soap and water.  She will follow-up in 1 week.  Follow-Up Instructions: Return in about 1 week (around 11/29/2017).   Ortho Exam  Patient is alert, oriented, no adenopathy,  well-dressed, normal affect, normal respiratory effort. The right transtibial amputation incisions are intact.  She does have a small nickel sized blister over the distal stump which is limited to breakdown of the skin.  There are no signs of cellulitis.  There is mild edema of the stump.  She has full knee extension and good flexion.  Imaging: No results found.   Labs: Lab Results  Component Value Date   HGBA1C 12.9 (H) 10/04/2017   HGBA1C 15.4 (H) 02/22/2017   HGBA1C 14.3 (H) 06/01/2016   ESRSEDRATE 105 (H) 02/22/2017   ESRSEDRATE 114 (H) 06/01/2016   ESRSEDRATE 87 (H) 05/23/2016   CRP 7.7 (H) 02/23/2017   CRP 5.3 (H) 06/01/2016   CRP 5.9 (H) 05/23/2016   LABURIC 3.8 06/04/2016   LABURIC 4.1 06/01/2016   REPTSTATUS 11/14/2017 FINAL 11/09/2017   GRAMSTAIN  11/09/2017    ABUNDANT WBC PRESENT,BOTH PMN AND MONONUCLEAR ABUNDANT GRAM POSITIVE COCCI FEW GRAM NEGATIVE RODS    CULT  11/09/2017    MODERATE STAPHYLOCOCCUS AUREUS MODERATE GROUP B STREP(S.AGALACTIAE)ISOLATED TESTING AGAINST S. AGALACTIAE NOT ROUTINELY PERFORMED DUE TO PREDICTABILITY OF AMP/PEN/VAN SUSCEPTIBILITY. MODERATE BACTEROIDES FRAGILIS BETA LACTAMASE POSITIVE Performed at [facility], [place] [address], [place], [place] [address]    [place] 11/09/2017  Lab Results  Component Value Date   ALBUMIN 1.7 (L) 11/10/2017   ALBUMIN 2.7 (L) 11/06/2017   ALBUMIN 3.7 10/04/2017   PREALBUMIN 5.1 (L) 02/23/2017   LABURIC 3.8 06/04/2016   LABURIC 4.1 06/01/2016    Body mass index is 29.44 kg/m.  Orders:  No orders of the defined types were placed in this encounter.  No orders of the defined types were placed in this encounter.    Procedures: No procedures performed  Clinical Data: No additional findings.  ROS:  All other systems negative, except as noted in the HPI. Review of Systems  Objective: Vital Signs: Ht 5\' 2"  (1.575 m)   Wt 160 lb 15 oz (73 kg)   LMP  03/29/2015   BMI 29.44 kg/m   Specialty Comments:  No specialty comments available.  PMFS History: Patient Active Problem List   Diagnosis Date Noted  . Cellulitis and abscess of foot 11/06/2017  . Stress incontinence 05/30/2017  . Toe amputation status, right ([place]) 03/16/2017  . Sepsis ([place]) 02/22/2017  . Schizophrenia ([place])   . S/P transmetatarsal amputation of foot, left ([place]) 06/15/2016  . DM (diabetes mellitus), type 2, uncontrolled, periph vascular complic ([place]) 25/07/3974  . Anemia of chronic disease 06/07/2016  . Moderate protein-calorie malnutrition ([place]) 06/03/2016  . Osteomyelitis due to type 2 diabetes mellitus ([place]) 06/02/2016  . Colon cancer screening 02/17/2014  . Breast cancer screening 02/17/2014  . Diabetes ([place]) 07/28/2013  . Cholelithiases 07/28/2013  . DKA (diabetic ketoacidoses) ([place]) 06/18/2013  . HTN (hypertension) 06/18/2013  . Cellulitis of female genitalia 08/03/2012  . Hyperglycemia 07/31/2012  . Candidal skin infection 07/31/2012   Past Medical History:  Diagnosis Date  . Diabetes mellitus   . Hypertension   . Osteomyelitis of right foot ([place]) 02/22/2017  . Schizophrenia ([place])   . Septic arthritis of interphalangeal joint of toe of left foot ([place]) 06/02/2016  . Stress incontinence 04/26/2017    Family History  Problem Relation Age of Onset  . Diabetes type II Father   . CAD Father   . Prostate cancer Father   . Diabetes Mellitus II Brother     Past Surgical History:  Procedure Laterality Date  . AMPUTATION Left 06/05/2016   Procedure: LEFT FOOT TRANSMETATARSAL AMPUTATION;  Surgeon: [name], MD;  Location: [facility];  Service: Orthopedics;  Laterality: Left;  . AMPUTATION Right 11/11/2017   Procedure: AMPUTATION BELOW KNEE;  Surgeon: [name], MD;  Location: [facility] [place];  Service: Orthopedics;  Laterality: Right;  . AMPUTATION TOE Right 02/23/2017   Procedure: AMPUTATION RIGHT THIRD TOE;  Surgeon: [name], MD;  Location: [facility];   Service: Orthopedics;  Laterality: Right;  . I&D EXTREMITY Right 11/09/2017   Procedure: Debride Ulcer Right Heel;  Surgeon: [name], MD;  Location: [place];  Service: Orthopedics;  Laterality: Right;  . TUBAL LIGATION     Social History   Occupational History  . Not on file  Tobacco Use  . Smoking status: Never Smoker  . Smokeless tobacco: Never Used  Substance and Sexual Activity  . Alcohol use: No    Comment: occasionally  . Drug use: No  . Sexual activity: Not Currently

## 2017-11-23 ENCOUNTER — Encounter (INDEPENDENT_AMBULATORY_CARE_PROVIDER_SITE_OTHER): Payer: Self-pay | Admitting: Physician Assistant

## 2017-11-26 ENCOUNTER — Ambulatory Visit (INDEPENDENT_AMBULATORY_CARE_PROVIDER_SITE_OTHER): Payer: Medicaid Other | Admitting: Physician Assistant

## 2017-11-27 ENCOUNTER — Telehealth (INDEPENDENT_AMBULATORY_CARE_PROVIDER_SITE_OTHER): Payer: Self-pay | Admitting: Physician Assistant

## 2017-11-27 NOTE — Telephone Encounter (Signed)
FYI

## 2017-11-27 NOTE — Telephone Encounter (Signed)
PK-director of nursing at [facility] called left voicemail message stating patient was discharges from their facility. The number to contact PK if needed is [phone]

## 2017-11-29 ENCOUNTER — Other Ambulatory Visit: Payer: Self-pay | Admitting: Family Medicine

## 2017-11-29 ENCOUNTER — Ambulatory Visit (INDEPENDENT_AMBULATORY_CARE_PROVIDER_SITE_OTHER): Payer: Medicaid Other | Admitting: Physician Assistant

## 2017-11-29 ENCOUNTER — Other Ambulatory Visit: Payer: Self-pay | Admitting: Physician Assistant

## 2017-11-29 DIAGNOSIS — Z794 Long term (current) use of insulin: Principal | ICD-10-CM

## 2017-11-29 DIAGNOSIS — E1165 Type 2 diabetes mellitus with hyperglycemia: Secondary | ICD-10-CM

## 2017-11-29 DIAGNOSIS — R609 Edema, unspecified: Secondary | ICD-10-CM

## 2017-12-04 ENCOUNTER — Telehealth (INDEPENDENT_AMBULATORY_CARE_PROVIDER_SITE_OTHER): Payer: Self-pay

## 2017-12-04 NOTE — Telephone Encounter (Signed)
Patient daughter was called today concerning Gina [name] appts; patient has missed post-op appts for Dr Sharol Given. Left VM for her to return call.

## 2017-12-05 ENCOUNTER — Telehealth (INDEPENDENT_AMBULATORY_CARE_PROVIDER_SITE_OTHER): Payer: Self-pay

## 2017-12-05 NOTE — Telephone Encounter (Signed)
Patient was called pertaining to her missed appointments but the number available is not a working number. Tried to call her daughter and she has not returned callback and can not leave a message today due to it being full.  Gina [name] has missed her post-op appts in the past and need to be followed up.  Thanks, FYI.

## 2017-12-17 ENCOUNTER — Telehealth: Payer: Self-pay

## 2017-12-17 NOTE — Telephone Encounter (Signed)
Call received from [facility] inquiring if the patient has a follow up appointment at [facility]. She was discharged from [facility] and she Earlie Server) has not been able to reach her at any of the phone numbers provided in Epic   Informed her that there are currently no appointments scheduled at [facility].

## 2017-12-24 ENCOUNTER — Ambulatory Visit (INDEPENDENT_AMBULATORY_CARE_PROVIDER_SITE_OTHER): Payer: Self-pay | Admitting: Orthopedic Surgery

## 2017-12-24 ENCOUNTER — Encounter (INDEPENDENT_AMBULATORY_CARE_PROVIDER_SITE_OTHER): Payer: Self-pay | Admitting: Orthopedic Surgery

## 2017-12-24 DIAGNOSIS — Z89511 Acquired absence of right leg below knee: Secondary | ICD-10-CM

## 2017-12-25 NOTE — Progress Notes (Signed)
Office Visit Note   Patient: Gina [name]           Date of Birth: November 12, 1960           MRN: [id number] Visit Date: 12/24/2017              Requested by: Alfonse Spruce, FNP No address on file PCP: Alfonse Spruce, FNP  Chief Complaint  Patient presents with  . Right Knee - Routine Post Op      HPI: Patient is a 57 year old woman who presents 6 weeks status post transtibial amputation.  Patient is pleased with her progress.  She states she does have some pain and she does not feel the Neurontin helps.  Assessment & Plan: Visit Diagnoses:  1. Acquired absence of right lower extremity below knee ([place])     Plan: A prescription was provided for wheelchair ramp anticipate she will be fit for prosthesis at this time with [facility].  Follow-Up Instructions: Return in about 3 weeks (around 01/14/2018).   Ortho Exam  Patient is alert, oriented, no adenopathy, well-dressed, normal affect, normal respiratory effort. Examination the residual limb is well consolidated patient has made excellent improvement 6 weeks postoperatively.  Staples are harvested today.  Imaging: No results found.   Labs: Lab Results  Component Value Date   HGBA1C 12.9 (H) 10/04/2017   HGBA1C 15.4 (H) 02/22/2017   HGBA1C 14.3 (H) 06/01/2016   ESRSEDRATE 105 (H) 02/22/2017   ESRSEDRATE 114 (H) 06/01/2016   ESRSEDRATE 87 (H) 05/23/2016   CRP 7.7 (H) 02/23/2017   CRP 5.3 (H) 06/01/2016   CRP 5.9 (H) 05/23/2016   LABURIC 3.8 06/04/2016   LABURIC 4.1 06/01/2016   REPTSTATUS 11/14/2017 FINAL 11/09/2017   GRAMSTAIN  11/09/2017    ABUNDANT WBC PRESENT,BOTH PMN AND MONONUCLEAR ABUNDANT GRAM POSITIVE COCCI FEW GRAM NEGATIVE RODS    CULT  11/09/2017    MODERATE STAPHYLOCOCCUS AUREUS MODERATE GROUP B STREP(S.AGALACTIAE)ISOLATED TESTING AGAINST S. AGALACTIAE NOT ROUTINELY PERFORMED DUE TO PREDICTABILITY OF AMP/PEN/VAN SUSCEPTIBILITY. MODERATE BACTEROIDES FRAGILIS BETA LACTAMASE  POSITIVE Performed at [facility], [place] [address] [place], [place] [address]    LABORGA STAPHYLOCOCCUS AUREUS 11/09/2017     Lab Results  Component Value Date   ALBUMIN 1.7 (L) 11/10/2017   ALBUMIN 2.7 (L) 11/06/2017   ALBUMIN 3.7 10/04/2017   PREALBUMIN 5.1 (L) 02/23/2017   LABURIC 3.8 06/04/2016   LABURIC 4.1 06/01/2016    There is no height or weight on file to calculate BMI.  Orders:  No orders of the defined types were placed in this encounter.  No orders of the defined types were placed in this encounter.    Procedures: No procedures performed  Clinical Data: No additional findings.  ROS:  All other systems negative, except as noted in the HPI. Review of Systems  Objective: Vital Signs: LMP 03/29/2015   Specialty Comments:  No specialty comments available.  PMFS History: Patient Active Problem List   Diagnosis Date Noted  . Cellulitis and abscess of foot 11/06/2017  . Stress incontinence 05/30/2017  . 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 Sepsis ([place]) 02/22/2017  . Schizophrenia ([place])   . S/P transmetatarsal amputation of foot, left ([place]) 06/15/2016  . DM (diabetes mellitus), type 2, uncontrolled, periph vascular complic ([place]) 61/60/7371  . Anemia of chronic disease 06/07/2016  . Moderate protein-calorie malnutrition ([place]) 06/03/2016  . Osteomyelitis due to type 2 diabetes mellitus ([place]) 06/02/2016  . Colon cancer screening 02/17/2014  . Breast cancer  screening 02/17/2014  . Diabetes ([place]) 07/28/2013  . Cholelithiases 07/28/2013  . DKA (diabetic ketoacidoses) ([place]) 06/18/2013  . HTN (hypertension) 06/18/2013  . Cellulitis of female genitalia 08/03/2012  . Hyperglycemia 07/31/2012  . Candidal skin infection 07/31/2012   Past Medical History:  Diagnosis Date  . Diabetes mellitus   . Hypertension   . Osteomyelitis of right foot ([place]) 02/22/2017  . Schizophrenia ([place])   . Septic arthritis of interphalangeal joint of toe of left  foot ([place]) 06/02/2016  . Stress incontinence 04/26/2017    Family History  Problem Relation Age of Onset  . Diabetes type II Father   . CAD Father   . Prostate cancer Father   . Diabetes Mellitus II Brother     Past Surgical History:  Procedure Laterality Date  . AMPUTATION Left 06/05/2016   Procedure: LEFT FOOT TRANSMETATARSAL AMPUTATION;  Surgeon: [name], MD;  [facility];  Service: Orthopedics;  Laterality: Left;  . AMPUTATION Right 11/11/2017   Procedure: AMPUTATION BELOW KNEE;  Surgeon: [name], MD;  Location: [facility];  Service: Orthopedics;  Laterality: Right;  . AMPUTATION TOE Right 02/23/2017   Procedure: AMPUTATION RIGHT THIRD TOE;  Surgeon: [name], MD;  Location: [facility];  Service: Orthopedics;  Laterality: Right;  . I&D EXTREMITY Right 11/09/2017   Procedure: Debride Ulcer Right Heel;  Surgeon: [name], MD;  Location: [facility];  Service: Orthopedics;  Laterality: Right;  . TUBAL LIGATION     Social History   Occupational History  . Not on file  Tobacco Use  . Smoking status: Never Smoker  . Smokeless tobacco: Never Used  Substance and Sexual Activity  . Alcohol use: No    Comment: occasionally  . Drug use: No  . Sexual activity: Not Currently

## 2018-01-21 ENCOUNTER — Ambulatory Visit (INDEPENDENT_AMBULATORY_CARE_PROVIDER_SITE_OTHER): Payer: Medicaid Other | Admitting: Orthopedic Surgery

## 2018-02-12 ENCOUNTER — Telehealth (INDEPENDENT_AMBULATORY_CARE_PROVIDER_SITE_OTHER): Payer: Self-pay | Admitting: Orthopedic Surgery

## 2018-02-12 ENCOUNTER — Other Ambulatory Visit (INDEPENDENT_AMBULATORY_CARE_PROVIDER_SITE_OTHER): Payer: Self-pay

## 2018-02-12 NOTE — Telephone Encounter (Signed)
Patient called asked if Dr Sharol Given can write a letter to the director of [facility] stating she need an apartment that is wheelchair accessible with a ramp and call bell. Also patient said she need a pull up bar for the bath tub. The number to [facility] is [place] or [place] [employer]) The number to contact patient is [phone]

## 2018-02-13 NOTE — Telephone Encounter (Signed)
Letter written and this has been mailed to the pt.

## 2018-02-25 NOTE — Telephone Encounter (Signed)
Letter was faxed to number provided. I tried to call pt to advise and message stated that pt is not able to receive calls at this time

## 2018-02-25 NOTE — Telephone Encounter (Signed)
Patient called back checking on the status of the letter she requested in regards to an apartment with wheelchair accessibility and call bell.  I advised the patient that the letter was written and mailed to her.  She stated that she wants the letter faxed to them.  The number to fax the letter to them is [phone].  Thank you.

## 2018-03-13 ENCOUNTER — Other Ambulatory Visit: Payer: Self-pay

## 2018-03-13 ENCOUNTER — Emergency Department (HOSPITAL_COMMUNITY): Payer: Medicaid Other

## 2018-03-13 ENCOUNTER — Encounter (HOSPITAL_COMMUNITY): Payer: Self-pay | Admitting: Obstetrics and Gynecology

## 2018-03-13 ENCOUNTER — Emergency Department (HOSPITAL_COMMUNITY)
Admission: EM | Admit: 2018-03-13 | Discharge: 2018-03-14 | Disposition: A | Discharge status: Home / Self Care | Payer: Medicaid Other | Attending: Emergency Medicine | Admitting: Emergency Medicine

## 2018-03-13 DIAGNOSIS — Z008 Encounter for other general examination: Secondary | ICD-10-CM | POA: Diagnosis present

## 2018-03-13 DIAGNOSIS — I10 Essential (primary) hypertension: Secondary | ICD-10-CM | POA: Diagnosis not present

## 2018-03-13 DIAGNOSIS — Z79899 Other long term (current) drug therapy: Secondary | ICD-10-CM | POA: Diagnosis not present

## 2018-03-13 DIAGNOSIS — F29 Unspecified psychosis not due to a substance or known physiological condition: Secondary | ICD-10-CM

## 2018-03-13 DIAGNOSIS — F329 Major depressive disorder, single episode, unspecified: Secondary | ICD-10-CM | POA: Diagnosis not present

## 2018-03-13 DIAGNOSIS — E1165 Type 2 diabetes mellitus with hyperglycemia: Secondary | ICD-10-CM | POA: Insufficient documentation

## 2018-03-13 DIAGNOSIS — R45851 Suicidal ideations: Secondary | ICD-10-CM | POA: Insufficient documentation

## 2018-03-13 DIAGNOSIS — Z794 Long term (current) use of insulin: Secondary | ICD-10-CM | POA: Diagnosis not present

## 2018-03-13 DIAGNOSIS — R4182 Altered mental status, unspecified: Secondary | ICD-10-CM | POA: Diagnosis not present

## 2018-03-13 DIAGNOSIS — F23 Brief psychotic disorder: Secondary | ICD-10-CM | POA: Insufficient documentation

## 2018-03-13 DIAGNOSIS — F209 Schizophrenia, unspecified: Secondary | ICD-10-CM | POA: Diagnosis not present

## 2018-03-13 DIAGNOSIS — R739 Hyperglycemia, unspecified: Secondary | ICD-10-CM

## 2018-03-13 DIAGNOSIS — F32A Depression, unspecified: Secondary | ICD-10-CM

## 2018-03-13 LAB — RAPID URINE DRUG SCREEN, HOSP PERFORMED
Amphetamines: NOT DETECTED
Barbiturates: NOT DETECTED
Benzodiazepines: NOT DETECTED
Cocaine: NOT DETECTED
Opiates: NOT DETECTED
Tetrahydrocannabinol: NOT DETECTED

## 2018-03-13 LAB — URINALYSIS, ROUTINE W REFLEX MICROSCOPIC
Bilirubin Urine: NEGATIVE
Glucose, UA: >=500 mg/dL — AB
Ketones, ur: 20 mg/dL — AB
Leukocytes, UA: NEGATIVE
Nitrite: NEGATIVE
PROTEIN: NEGATIVE mg/dL
Specific Gravity, Urine: 1.029 (ref 1.005–1.030)
pH: 6 (ref 5.0–8.0)

## 2018-03-13 LAB — I-STAT CHEM 8, ED
BUN: 11 mg/dL (ref 6–20)
CALCIUM ION: 1.12 mmol/L — AB (ref 1.15–1.40)
Chloride: 94 mmol/L — ABNORMAL LOW (ref 98–111)
Creatinine, Ser: 0.6 mg/dL (ref 0.44–1.00)
Glucose, Bld: 645 mg/dL (ref 70–99)
HCT: 43 % (ref 36.0–46.0)
Hemoglobin: 14.6 g/dL (ref 12.0–15.0)
Potassium: 3.9 mmol/L (ref 3.5–5.1)
Sodium: 131 mmol/L — ABNORMAL LOW (ref 135–145)
TCO2: 26 mmol/L (ref 22–32)

## 2018-03-13 LAB — HCG, QUANTITATIVE, PREGNANCY: hCG, Beta Chain, Quant, S: 14 m[IU]/mL — ABNORMAL HIGH (ref <5)

## 2018-03-13 LAB — COMPREHENSIVE METABOLIC PANEL
ALT: 29 U/L (ref 0–44)
AST: 28 U/L (ref 15–41)
Albumin: 3.7 g/dL (ref 3.5–5.0)
Alkaline Phosphatase: 78 U/L (ref 38–126)
Anion gap: 15 (ref 5–15)
BUN: 11 mg/dL (ref 6–20)
CO2: 23 mmol/L (ref 22–32)
Calcium: 9 mg/dL (ref 8.9–10.3)
Chloride: 93 mmol/L — ABNORMAL LOW (ref 98–111)
Creatinine, Ser: 0.86 mg/dL (ref 0.44–1.00)
GFR calc non Af Amer: >60 mL/min (ref >60)
Glucose, Bld: 653 mg/dL (ref 70–99)
Potassium: 3.8 mmol/L (ref 3.5–5.1)
Sodium: 131 mmol/L — ABNORMAL LOW (ref 135–145)
Total Bilirubin: 0.7 mg/dL (ref 0.3–1.2)
Total Protein: 7.7 g/dL (ref 6.5–8.1)

## 2018-03-13 LAB — CBC
HCT: 40.7 % (ref 36.0–46.0)
HEMOGLOBIN: 13.1 g/dL (ref 12.0–15.0)
MCH: 29.8 pg (ref 26.0–34.0)
MCHC: 32.2 g/dL (ref 30.0–36.0)
MCV: 92.7 fL (ref 80.0–100.0)
PLATELETS: 207 10*3/uL (ref 150–400)
RBC: 4.39 MIL/uL (ref 3.87–5.11)
RDW: 12.6 % (ref 11.5–15.5)
WBC: 9.4 10*3/uL (ref 4.0–10.5)
nRBC: 0 % (ref 0.0–0.2)

## 2018-03-13 LAB — I-STAT BETA HCG BLOOD, ED (MC, WL, AP ONLY): I-stat hCG, quantitative: 16.4 m[IU]/mL — ABNORMAL HIGH (ref <5)

## 2018-03-13 LAB — CBG MONITORING, ED
Glucose-Capillary: 504 mg/dL (ref 70–99)
Glucose-Capillary: 481 mg/dL — ABNORMAL HIGH (ref 70–99)
Glucose-Capillary: 445 mg/dL — ABNORMAL HIGH (ref 70–99)
Glucose-Capillary: 377 mg/dL — ABNORMAL HIGH (ref 70–99)

## 2018-03-13 LAB — ACETAMINOPHEN LEVEL: Acetaminophen (Tylenol), Serum: <10 ug/mL — ABNORMAL LOW (ref 10–30)

## 2018-03-13 LAB — ETHANOL: Alcohol, Ethyl (B): <10 mg/dL (ref <10)

## 2018-03-13 LAB — SALICYLATE LEVEL: Salicylate Lvl: <7 mg/dL (ref 2.8–30.0)

## 2018-03-13 MED ORDER — LINAGLIPTIN 5 MG PO TABS
5.0000 mg | ORAL_TABLET | Freq: Every day | ORAL | Status: DC
  Administered 2018-03-14: 5 mg via ORAL
  Filled 2018-03-13: qty 1

## 2018-03-13 MED ORDER — INSULIN REGULAR(HUMAN) IN NACL 100-0.9 UT/100ML-% IV SOLN
INTRAVENOUS | Status: DC
  Administered 2018-03-13: 5.4 [IU]/h via INTRAVENOUS
  Filled 2018-03-13: qty 100

## 2018-03-13 MED ORDER — ESCITALOPRAM OXALATE 10 MG PO TABS
20.0000 mg | ORAL_TABLET | Freq: Every day | ORAL | Status: DC
  Administered 2018-03-14: 20 mg via ORAL
  Filled 2018-03-13: qty 2

## 2018-03-13 MED ORDER — AMLODIPINE BESYLATE 5 MG PO TABS
10.0000 mg | ORAL_TABLET | Freq: Every day | ORAL | Status: DC
  Administered 2018-03-14: 10 mg via ORAL
  Filled 2018-03-13: qty 2

## 2018-03-13 MED ORDER — GABAPENTIN 300 MG PO CAPS
300.0000 mg | ORAL_CAPSULE | Freq: Every day | ORAL | Status: DC
  Administered 2018-03-14: 300 mg via ORAL
  Filled 2018-03-13: qty 1

## 2018-03-13 MED ORDER — ACETAMINOPHEN 325 MG PO TABS: 650.0000 mg | ORAL_TABLET | Freq: Four times a day (QID) | ORAL | Status: DC | PRN

## 2018-03-13 MED ORDER — DEXTROSE-NACL 5-0.45 % IV SOLN: INTRAVENOUS | Status: DC

## 2018-03-13 MED ORDER — LISINOPRIL 10 MG PO TABS
5.0000 mg | ORAL_TABLET | Freq: Every day | ORAL | Status: DC
  Administered 2018-03-14: 10:00:00 via ORAL
  Filled 2018-03-13 (×2): qty 1

## 2018-03-13 MED ORDER — FUROSEMIDE 20 MG PO TABS
20.0000 mg | ORAL_TABLET | Freq: Every day | ORAL | Status: DC
  Administered 2018-03-14: 20 mg via ORAL
  Filled 2018-03-13: qty 1

## 2018-03-13 MED ORDER — AZITHROMYCIN 250 MG PO TABS
1000.0000 mg | ORAL_TABLET | Freq: Once | ORAL | Status: AC
  Administered 2018-03-13: 1000 mg via ORAL
  Filled 2018-03-13: qty 4

## 2018-03-13 MED ORDER — SODIUM CHLORIDE 0.9 % IV SOLN
1.0000 g | Freq: Once | INTRAVENOUS | Status: AC
  Administered 2018-03-13: 1 g via INTRAVENOUS
  Filled 2018-03-13: qty 10

## 2018-03-13 MED ORDER — INSULIN ASPART 100 UNIT/ML ~~LOC~~ SOLN
0.0000 [IU] | Freq: Three times a day (TID) | SUBCUTANEOUS | Status: DC
  Administered 2018-03-14 (×2): 5 [IU] via SUBCUTANEOUS
  Filled 2018-03-13 (×2): qty 1

## 2018-03-13 MED ORDER — POTASSIUM CHLORIDE 10 MEQ/100ML IV SOLN
10.0000 meq | Freq: Once | INTRAVENOUS | Status: AC
  Administered 2018-03-14: 10 meq via INTRAVENOUS
  Filled 2018-03-13: qty 100

## 2018-03-13 MED ORDER — INSULIN GLARGINE 100 UNIT/ML SOLOSTAR PEN: 30.0000 [IU] | PEN_INJECTOR | Freq: Every day | SUBCUTANEOUS | Status: DC

## 2018-03-13 NOTE — Progress Notes (Signed)
Per Patriciaann Clan, PA pt meets criteria for gero-psych treatment. EDP [place], Sophia, PA-C has been advised.  Lind Covert, MSW, LCSW Therapeutic Triage Specialist  [phone]
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
## 2018-03-13 NOTE — ED Notes (Signed)
Patient transported to CT 

## 2018-03-13 NOTE — Progress Notes (Signed)
CSW notes in chart pt is to be seen by TTS and that TTS will be consulted once pt is medically stable.  CSW spoke to TTS and TTS is alerted to the possible upcoming consult.  CSW will continue to follow for D/C needs.  Alphonse Guild. Renesme Kerrigan, LCSW, LCAS, CSI Clinical Social Worker Ph: [phone]
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
## 2018-03-13 NOTE — Progress Notes (Signed)
At pt's request CSW provide pt with contact information and address for the crisis shelter's for women in [place] and [place].  Pt stated she wanted to speak to the SANE nurse again because "things are coming back to me I didn't remember before".  CSW will update the [facility] ED CN.  CSW will continue to follow for D/C needs.  Alphonse Guild. Lesli Issa, LCSW, LCAS, CSI Clinical Social Worker Ph: [phone]     \

## 2018-03-13 NOTE — ED Notes (Signed)
Pt changed out into wine scrubs and yellow socks. Pt's belongings are in 2 bags in the cabinet for room #25 with pt labels on them.

## 2018-03-13 NOTE — Consult Note (Signed)
The SANE/FNE Naval [employer]) consult has been completed. The primary or charge RN and physician have been notified. Please contact the SANE/FNE nurse on call (listed in [place]) with any further concerns. Follow up appointment request sent to [facility] via [employer].

## 2018-03-13 NOTE — ED Notes (Signed)
Pt is on the phone telling people she "was almost in a coma" and that she needs help and doesn't want to be here or live. Pt telling people on the phone that she "Was on the floor and wants to be dead"

## 2018-03-13 NOTE — ED Notes (Signed)
Writer took pt to bathroom in a wheelchair. Pt ask to wash her body off while she was in the bathroom. Writer set Pt up with warm water in basin, soap , wash cloths etc. Pt moved herself from the wheelchair to the toilet.  After Pt gave a urine sample, Pt sat herself back in the wheelchair and she washed her body off. When Pt was finish, Pt put her own clothes on and then writer rolled pt back to her bed. Pt also put herself back in the bed without writers help. Pt was very pleasant.
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
## 2018-03-13 NOTE — ED Notes (Signed)
Date and time results received: 03/13/18 1743 (use smartphrase ".now" to insert current time)  Test: glucose Critical Value: 653  Name of Provider Notified: Sophia Caccavale  Orders Received? Or Actions Taken?: Actions Taken: notifed Sophia Caccavale of glucose 653
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
## 2018-03-13 NOTE — ED Notes (Signed)
Patient is screaming that she now wants to die and she is not a dog and has medicaid and does not want to be in the hall

## 2018-03-13 NOTE — ED Notes (Signed)
SANE nurse at bedside.
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
## 2018-03-14 DIAGNOSIS — F329 Major depressive disorder, single episode, unspecified: Secondary | ICD-10-CM | POA: Diagnosis present

## 2018-03-14 DIAGNOSIS — F32A Depression, unspecified: Secondary | ICD-10-CM | POA: Diagnosis present

## 2018-03-14 LAB — CBG MONITORING, ED
GLUCOSE-CAPILLARY: 253 mg/dL — AB (ref 70–99)
Glucose-Capillary: 248 mg/dL — ABNORMAL HIGH (ref 70–99)
Glucose-Capillary: 257 mg/dL — ABNORMAL HIGH (ref 70–99)
Glucose-Capillary: 274 mg/dL — ABNORMAL HIGH (ref 70–99)
Glucose-Capillary: 285 mg/dL — ABNORMAL HIGH (ref 70–99)

## 2018-03-14 MED ORDER — INSULIN GLARGINE 100 UNIT/ML ~~LOC~~ SOLN
30.0000 [IU] | SUBCUTANEOUS | Status: AC
  Administered 2018-03-14: 30 [IU] via SUBCUTANEOUS
  Filled 2018-03-14: qty 0.3

## 2018-03-14 MED ORDER — NYSTATIN 100000 UNIT/GM EX POWD
Freq: Once | CUTANEOUS | Status: AC
  Administered 2018-03-14: 06:00:00 via TOPICAL
  Filled 2018-03-14: qty 15

## 2018-03-14 MED ORDER — INSULIN ASPART 100 UNIT/ML ~~LOC~~ SOLN
5.0000 [IU] | Freq: Once | SUBCUTANEOUS | Status: AC
  Administered 2018-03-14: 5 [IU] via SUBCUTANEOUS
  Filled 2018-03-14: qty 1

## 2018-03-14 MED ORDER — INSULIN GLARGINE 100 UNIT/ML ~~LOC~~ SOLN
30.0000 [IU] | Freq: Every day | SUBCUTANEOUS | Status: DC
  Filled 2018-03-14: qty 0.3

## 2018-03-14 NOTE — ED Notes (Signed)
Psych Provider at bedside. 
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
## 2018-03-14 NOTE — ED Notes (Signed)
Waiting on her son to come pick her up
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
## 2018-03-14 NOTE — ED Notes (Signed)
Patient is requesting to be placed in an ALF.  She currently lives alone and feels like she would benefit from living at an ALF.

## 2018-03-14 NOTE — ED Notes (Signed)
Will set up an appt for patient at a pcp office, then d/c home

## 2018-03-14 NOTE — ED Notes (Signed)
Bed: WA27 Expected date:  Expected time:  Means of arrival:  Comments: Hold for 25

## 2018-03-14 NOTE — ED Provider Notes (Signed)
6:10 AM  Pt's blood glucose is now going back up.  It was 248 at 12:18 AM and then is 257 at 3:42 AM and now 6:07 AM it is 285.  Discussed with pharmacist.  Will give 30 units of Lantus now as well as again at tonight at 10 PM.  We will also give 5 units of subcutaneous insulin now.  SSI has been ordered with meals.   Jobani Sabado, Delice Bison, DO 03/14/18 [phone]

## 2018-03-14 NOTE — Discharge Instructions (Signed)
For your behavioral health and other health care needs, you are advised to continue treatment with the [facility]:       [facility]      [place], [place] [address]       [phone]  If you are not able to continue receiving their services, contact [facility] and ask about scheduling an intake appointment.  They also provide both behavioral health and primary care services:       [facility]      [address]      [place], [place] [address]      [phone]

## 2018-03-14 NOTE — BH Assessment (Signed)
[facility] Assessment Progress Note  Per Norman Clay, MD, this pt does not require psychiatric hospitalization at this time.  Pt is to be discharged from [facility].  Pt receives wrap-around services from the [facility], but she indicates that she may no longer be able to receive treatment from them.  Nurse Case Management will be asked to schedule a follow up appointment for pt; Ollen Barges, CSW agrees to follow up on this.  Discharge instructions include contact information for the [facility] and [facility], with [facility] listed as an alternative.  Pt's nurse, Estill Bamberg, has been notified.  Jalene Mullet, [place] Triage Specialist [phone]

## 2018-03-27 ENCOUNTER — Ambulatory Visit: Payer: Medicaid Other

## 2018-04-06 ENCOUNTER — Other Ambulatory Visit: Payer: Self-pay | Admitting: Family Medicine

## 2018-04-06 ENCOUNTER — Other Ambulatory Visit: Payer: Self-pay | Admitting: Physician Assistant

## 2018-04-06 DIAGNOSIS — E1165 Type 2 diabetes mellitus with hyperglycemia: Secondary | ICD-10-CM

## 2018-04-06 DIAGNOSIS — R609 Edema, unspecified: Secondary | ICD-10-CM

## 2018-04-06 DIAGNOSIS — Z794 Long term (current) use of insulin: Principal | ICD-10-CM

## 2018-04-09 ENCOUNTER — Telehealth: Payer: Self-pay | Admitting: Family Medicine

## 2018-04-09 DIAGNOSIS — Z794 Long term (current) use of insulin: Principal | ICD-10-CM

## 2018-04-09 DIAGNOSIS — E1165 Type 2 diabetes mellitus with hyperglycemia: Secondary | ICD-10-CM

## 2018-04-09 NOTE — Telephone Encounter (Signed)
Pt's pharmacy called requesting prior auth JANUVIA 100 MG tablet  Also refill of the Insulin Glargine (LANTUS SOLOSTAR) 100 UNIT/ML Solostar Pen or an alternative sent to  send to [employer]

## 2018-04-12 MED ORDER — INSULIN GLARGINE 100 UNIT/ML SOLOSTAR PEN: PEN_INJECTOR | SUBCUTANEOUS | 0 refills | Status: DC

## 2018-04-23 ENCOUNTER — Other Ambulatory Visit: Payer: Self-pay | Admitting: Family Medicine

## 2018-04-23 DIAGNOSIS — E1165 Type 2 diabetes mellitus with hyperglycemia: Secondary | ICD-10-CM

## 2018-04-23 DIAGNOSIS — R609 Edema, unspecified: Secondary | ICD-10-CM

## 2018-04-23 DIAGNOSIS — Z794 Long term (current) use of insulin: Secondary | ICD-10-CM

## 2018-05-08 ENCOUNTER — Emergency Department (HOSPITAL_COMMUNITY): Payer: Medicaid Other

## 2018-05-08 ENCOUNTER — Other Ambulatory Visit: Payer: Self-pay

## 2018-05-08 ENCOUNTER — Inpatient Hospital Stay (HOSPITAL_COMMUNITY)
Admission: EM | Admit: 2018-05-08 | Discharge: 2018-05-21 | DRG: 853 | Disposition: A | Discharge status: Skilled Nursing Facility | Payer: Medicaid Other | Attending: Nephrology | Admitting: Nephrology

## 2018-05-08 ENCOUNTER — Encounter (HOSPITAL_COMMUNITY): Payer: Self-pay | Admitting: Emergency Medicine

## 2018-05-08 DIAGNOSIS — I96 Gangrene, not elsewhere classified: Secondary | ICD-10-CM | POA: Diagnosis present

## 2018-05-08 DIAGNOSIS — M869 Osteomyelitis, unspecified: Secondary | ICD-10-CM | POA: Diagnosis present

## 2018-05-08 DIAGNOSIS — Z91138 Patient's unintentional underdosing of medication regimen for other reason: Secondary | ICD-10-CM | POA: Diagnosis not present

## 2018-05-08 DIAGNOSIS — L97429 Non-pressure chronic ulcer of left heel and midfoot with unspecified severity: Secondary | ICD-10-CM | POA: Diagnosis present

## 2018-05-08 DIAGNOSIS — M86172 Other acute osteomyelitis, left ankle and foot: Secondary | ICD-10-CM | POA: Diagnosis not present

## 2018-05-08 DIAGNOSIS — T383X6A Underdosing of insulin and oral hypoglycemic [antidiabetic] drugs, initial encounter: Secondary | ICD-10-CM | POA: Diagnosis present

## 2018-05-08 DIAGNOSIS — F209 Schizophrenia, unspecified: Secondary | ICD-10-CM | POA: Diagnosis present

## 2018-05-08 DIAGNOSIS — N179 Acute kidney failure, unspecified: Secondary | ICD-10-CM | POA: Diagnosis not present

## 2018-05-08 DIAGNOSIS — I10 Essential (primary) hypertension: Secondary | ICD-10-CM | POA: Diagnosis present

## 2018-05-08 DIAGNOSIS — Z833 Family history of diabetes mellitus: Secondary | ICD-10-CM

## 2018-05-08 DIAGNOSIS — L97529 Non-pressure chronic ulcer of other part of left foot with unspecified severity: Secondary | ICD-10-CM | POA: Diagnosis not present

## 2018-05-08 DIAGNOSIS — Z91410 Personal history of adult physical and sexual abuse: Secondary | ICD-10-CM

## 2018-05-08 DIAGNOSIS — F32A Depression, unspecified: Secondary | ICD-10-CM | POA: Diagnosis present

## 2018-05-08 DIAGNOSIS — Z008 Encounter for other general examination: Secondary | ICD-10-CM | POA: Diagnosis not present

## 2018-05-08 DIAGNOSIS — E1140 Type 2 diabetes mellitus with diabetic neuropathy, unspecified: Secondary | ICD-10-CM | POA: Diagnosis not present

## 2018-05-08 DIAGNOSIS — R339 Retention of urine, unspecified: Secondary | ICD-10-CM

## 2018-05-08 DIAGNOSIS — E1142 Type 2 diabetes mellitus with diabetic polyneuropathy: Secondary | ICD-10-CM | POA: Diagnosis present

## 2018-05-08 DIAGNOSIS — E1100 Type 2 diabetes mellitus with hyperosmolarity without nonketotic hyperglycemic-hyperosmolar coma (NKHHC): Secondary | ICD-10-CM | POA: Diagnosis present

## 2018-05-08 DIAGNOSIS — L03116 Cellulitis of left lower limb: Secondary | ICD-10-CM | POA: Diagnosis present

## 2018-05-08 DIAGNOSIS — M86272 Subacute osteomyelitis, left ankle and foot: Secondary | ICD-10-CM | POA: Diagnosis not present

## 2018-05-08 DIAGNOSIS — N170 Acute kidney failure with tubular necrosis: Secondary | ICD-10-CM | POA: Diagnosis present

## 2018-05-08 DIAGNOSIS — E1110 Type 2 diabetes mellitus with ketoacidosis without coma: Secondary | ICD-10-CM | POA: Diagnosis present

## 2018-05-08 DIAGNOSIS — A419 Sepsis, unspecified organism: Principal | ICD-10-CM | POA: Diagnosis present

## 2018-05-08 DIAGNOSIS — F320 Major depressive disorder, single episode, mild: Secondary | ICD-10-CM | POA: Diagnosis not present

## 2018-05-08 DIAGNOSIS — F329 Major depressive disorder, single episode, unspecified: Secondary | ICD-10-CM | POA: Diagnosis present

## 2018-05-08 DIAGNOSIS — Z8249 Family history of ischemic heart disease and other diseases of the circulatory system: Secondary | ICD-10-CM | POA: Diagnosis not present

## 2018-05-08 DIAGNOSIS — D62 Acute posthemorrhagic anemia: Secondary | ICD-10-CM | POA: Diagnosis not present

## 2018-05-08 DIAGNOSIS — E0810 Diabetes mellitus due to underlying condition with ketoacidosis without coma: Secondary | ICD-10-CM | POA: Diagnosis present

## 2018-05-08 DIAGNOSIS — E11649 Type 2 diabetes mellitus with hypoglycemia without coma: Secondary | ICD-10-CM | POA: Diagnosis not present

## 2018-05-08 DIAGNOSIS — E43 Unspecified severe protein-calorie malnutrition: Secondary | ICD-10-CM | POA: Diagnosis present

## 2018-05-08 DIAGNOSIS — Z23 Encounter for immunization: Secondary | ICD-10-CM

## 2018-05-08 DIAGNOSIS — R17 Unspecified jaundice: Secondary | ICD-10-CM

## 2018-05-08 DIAGNOSIS — E1152 Type 2 diabetes mellitus with diabetic peripheral angiopathy with gangrene: Secondary | ICD-10-CM | POA: Diagnosis present

## 2018-05-08 DIAGNOSIS — E11621 Type 2 diabetes mellitus with foot ulcer: Secondary | ICD-10-CM | POA: Diagnosis present

## 2018-05-08 DIAGNOSIS — E1151 Type 2 diabetes mellitus with diabetic peripheral angiopathy without gangrene: Secondary | ICD-10-CM | POA: Diagnosis not present

## 2018-05-08 DIAGNOSIS — F333 Major depressive disorder, recurrent, severe with psychotic symptoms: Secondary | ICD-10-CM | POA: Diagnosis not present

## 2018-05-08 DIAGNOSIS — Z89432 Acquired absence of left foot: Secondary | ICD-10-CM | POA: Diagnosis not present

## 2018-05-08 DIAGNOSIS — R680 Hypothermia, not associated with low environmental temperature: Secondary | ICD-10-CM | POA: Diagnosis present

## 2018-05-08 DIAGNOSIS — T7491XA Unspecified adult maltreatment, confirmed, initial encounter: Secondary | ICD-10-CM

## 2018-05-08 DIAGNOSIS — Z89511 Acquired absence of right leg below knee: Secondary | ICD-10-CM | POA: Diagnosis not present

## 2018-05-08 DIAGNOSIS — E1165 Type 2 diabetes mellitus with hyperglycemia: Secondary | ICD-10-CM

## 2018-05-08 DIAGNOSIS — B373 Candidiasis of vulva and vagina: Secondary | ICD-10-CM | POA: Diagnosis present

## 2018-05-08 DIAGNOSIS — I739 Peripheral vascular disease, unspecified: Secondary | ICD-10-CM | POA: Diagnosis not present

## 2018-05-08 DIAGNOSIS — E1169 Type 2 diabetes mellitus with other specified complication: Secondary | ICD-10-CM | POA: Diagnosis present

## 2018-05-08 DIAGNOSIS — D638 Anemia in other chronic diseases classified elsewhere: Secondary | ICD-10-CM | POA: Diagnosis present

## 2018-05-08 LAB — CBC WITH DIFFERENTIAL/PLATELET
Abs Immature Granulocytes: 0.09 10*3/uL — ABNORMAL HIGH (ref 0.00–0.07)
Basophils Absolute: 0 10*3/uL (ref 0.0–0.1)
Basophils Relative: 0 %
Eosinophils Absolute: 0 10*3/uL (ref 0.0–0.5)
Eosinophils Relative: 0 %
HCT: 33.2 % — ABNORMAL LOW (ref 36.0–46.0)
Hemoglobin: 10.3 g/dL — ABNORMAL LOW (ref 12.0–15.0)
Immature Granulocytes: 1 %
Lymphocytes Relative: 12 %
Lymphs Abs: 1.4 10*3/uL (ref 0.7–4.0)
MCH: 30.2 pg (ref 26.0–34.0)
MCHC: 31 g/dL (ref 30.0–36.0)
MCV: 97.4 fL (ref 80.0–100.0)
Monocytes Absolute: 0.8 10*3/uL (ref 0.1–1.0)
Monocytes Relative: 6 %
Neutro Abs: 10 10*3/uL — ABNORMAL HIGH (ref 1.7–7.7)
Neutrophils Relative %: 81 %
Platelets: 324 10*3/uL (ref 150–400)
RBC: 3.41 MIL/uL — ABNORMAL LOW (ref 3.87–5.11)
RDW: 13.5 % (ref 11.5–15.5)
WBC: 12.4 10*3/uL — ABNORMAL HIGH (ref 4.0–10.5)
nRBC: 0 % (ref 0.0–0.2)

## 2018-05-08 LAB — COMPREHENSIVE METABOLIC PANEL
ALT: 19 U/L (ref 0–44)
AST: 17 U/L (ref 15–41)
Albumin: 2.7 g/dL — ABNORMAL LOW (ref 3.5–5.0)
Alkaline Phosphatase: 104 U/L (ref 38–126)
Anion gap: 24 — ABNORMAL HIGH (ref 5–15)
BUN: 9 mg/dL (ref 6–20)
CO2: 14 mmol/L — ABNORMAL LOW (ref 22–32)
Calcium: 9.3 mg/dL (ref 8.9–10.3)
Chloride: 94 mmol/L — ABNORMAL LOW (ref 98–111)
Creatinine, Ser: 1.2 mg/dL — ABNORMAL HIGH (ref 0.44–1.00)
GFR calc Af Amer: 58 mL/min — ABNORMAL LOW (ref >60)
GFR calc non Af Amer: 50 mL/min — ABNORMAL LOW (ref >60)
Glucose, Bld: 468 mg/dL — ABNORMAL HIGH (ref 70–99)
POTASSIUM: 4.2 mmol/L (ref 3.5–5.1)
Sodium: 132 mmol/L — ABNORMAL LOW (ref 135–145)
Total Bilirubin: 1.3 mg/dL — ABNORMAL HIGH (ref 0.3–1.2)
Total Protein: 8.1 g/dL (ref 6.5–8.1)

## 2018-05-08 LAB — LACTIC ACID, PLASMA: Lactic Acid, Venous: 1.1 mmol/L (ref 0.5–1.9)

## 2018-05-08 LAB — CBG MONITORING, ED
Glucose-Capillary: 339 mg/dL — ABNORMAL HIGH (ref 70–99)
Glucose-Capillary: 409 mg/dL — ABNORMAL HIGH (ref 70–99)

## 2018-05-08 LAB — I-STAT BETA HCG BLOOD, ED (MC, WL, AP ONLY): HCG, QUANTITATIVE: 6.1 m[IU]/mL — AB (ref <5)

## 2018-05-08 MED ORDER — INSULIN REGULAR(HUMAN) IN NACL 100-0.9 UT/100ML-% IV SOLN
INTRAVENOUS | Status: DC
  Administered 2018-05-08: 3.5 [IU]/h via INTRAVENOUS
  Filled 2018-05-08: qty 100

## 2018-05-08 MED ORDER — VANCOMYCIN HCL 10 G IV SOLR
1250.0000 mg | INTRAVENOUS | Status: AC
  Administered 2018-05-08: 1250 mg via INTRAVENOUS
  Filled 2018-05-08: qty 1250

## 2018-05-08 MED ORDER — POTASSIUM CHLORIDE 10 MEQ/100ML IV SOLN
10.0000 meq | INTRAVENOUS | Status: AC
  Administered 2018-05-08 (×2): 10 meq via INTRAVENOUS
  Filled 2018-05-08 (×2): qty 100

## 2018-05-08 MED ORDER — VANCOMYCIN HCL IN DEXTROSE 1-5 GM/200ML-% IV SOLN: 1000.0000 mg | Freq: Once | INTRAVENOUS | Status: DC

## 2018-05-08 MED ORDER — VANCOMYCIN HCL IN DEXTROSE 1-5 GM/200ML-% IV SOLN
1000.0000 mg | INTRAVENOUS | Status: DC
  Administered 2018-05-09 – 2018-05-10 (×2): 1000 mg via INTRAVENOUS
  Filled 2018-05-08 (×2): qty 200

## 2018-05-08 MED ORDER — SODIUM CHLORIDE 0.9% FLUSH
3.0000 mL | Freq: Once | INTRAVENOUS | Status: AC
  Administered 2018-05-08: 3 mL via INTRAVENOUS

## 2018-05-08 MED ORDER — SODIUM CHLORIDE 0.9 % IV SOLN
2.0000 g | INTRAVENOUS | Status: DC
  Administered 2018-05-08: 2 g via INTRAVENOUS
  Filled 2018-05-08: qty 20

## 2018-05-08 MED ORDER — DEXTROSE-NACL 5-0.45 % IV SOLN
INTRAVENOUS | Status: DC
  Administered 2018-05-09: 01:00:00 via INTRAVENOUS
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
## 2018-05-08 NOTE — ED Notes (Signed)
Pt called for a room, no answer ?

## 2018-05-08 NOTE — Progress Notes (Signed)
Pharmacy Antibiotic Note  Gina [name] is a 58 y.o. female admitted on 05/08/2018 with Stump infection/cellulitis.  Pharmacy has been consulted for Vanco dosing.  CC/HPI: infection of the left stump.  ID: Cellulitis. Afebrile. WBC 12.4. Scr 1.2 Vanco 2/26>>  Vancomycin 1000 mg IV Q 24 hrs. Goal AUC 400-550. Expected AUC: 532 SCr used: 1.2  Plan:  Vancomycin 1250mg  IV x 1 then 1g IV q 24h. Levels at steady state if drug continued    Height: 5\' 2"  (157.5 cm) Weight: 150 lb (68 kg) IBW/kg (Calculated) : 50.1  Temp (24hrs), Avg:95.5 F (35.3 C), [name]:93.9 F (34.4 C), Max:97 F (36.1 C)  Recent Labs  Lab 05/08/18 1306  WBC 12.4*  CREATININE 1.20*  LATICACIDVEN 1.1    Estimated Creatinine Clearance: 46.8 mL/[name] (A) (by C-G formula based on SCr of 1.2 mg/dL (H)).    Allergies  Allergen Reactions  . Metformin And Related Other (See Comments)    Upset stomach     Maritsa Hunsucker S. Alford Highland, PharmD, [place] Clinical Staff Pharmacist Eilene Ghazi [facility] 05/08/2018 9:26 PM
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
## 2018-05-08 NOTE — ED Triage Notes (Signed)
Pt BIB [facility] for an infection of the left stump. Pt reports she was at the prosthetics office and the would not fit her for a prosthesis due to an infection. Pt was told to come to the hospital.
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
## 2018-05-09 ENCOUNTER — Inpatient Hospital Stay (HOSPITAL_COMMUNITY): Payer: Medicaid Other

## 2018-05-09 DIAGNOSIS — E1165 Type 2 diabetes mellitus with hyperglycemia: Secondary | ICD-10-CM

## 2018-05-09 DIAGNOSIS — N179 Acute kidney failure, unspecified: Secondary | ICD-10-CM

## 2018-05-09 DIAGNOSIS — I739 Peripheral vascular disease, unspecified: Secondary | ICD-10-CM

## 2018-05-09 DIAGNOSIS — E1110 Type 2 diabetes mellitus with ketoacidosis without coma: Secondary | ICD-10-CM

## 2018-05-09 DIAGNOSIS — A419 Sepsis, unspecified organism: Principal | ICD-10-CM

## 2018-05-09 DIAGNOSIS — E1151 Type 2 diabetes mellitus with diabetic peripheral angiopathy without gangrene: Secondary | ICD-10-CM

## 2018-05-09 DIAGNOSIS — E1140 Type 2 diabetes mellitus with diabetic neuropathy, unspecified: Secondary | ICD-10-CM

## 2018-05-09 DIAGNOSIS — L97529 Non-pressure chronic ulcer of other part of left foot with unspecified severity: Secondary | ICD-10-CM

## 2018-05-09 DIAGNOSIS — T7491XA Unspecified adult maltreatment, confirmed, initial encounter: Secondary | ICD-10-CM

## 2018-05-09 DIAGNOSIS — M869 Osteomyelitis, unspecified: Secondary | ICD-10-CM

## 2018-05-09 LAB — HEMOGLOBIN A1C: Hgb A1c MFr Bld: >18.5 % — ABNORMAL HIGH (ref 4.8–5.6)

## 2018-05-09 LAB — URINALYSIS, ROUTINE W REFLEX MICROSCOPIC
Bilirubin Urine: NEGATIVE
Glucose, UA: >=500 mg/dL — AB
Ketones, ur: 80 mg/dL — AB
Leukocytes,Ua: NEGATIVE
NITRITE: NEGATIVE
Protein, ur: 30 mg/dL — AB
Specific Gravity, Urine: 1.018 (ref 1.005–1.030)
pH: 5 (ref 5.0–8.0)

## 2018-05-09 LAB — BASIC METABOLIC PANEL
Anion gap: 14 (ref 5–15)
Anion gap: 14 (ref 5–15)
BUN: 11 mg/dL (ref 6–20)
BUN: 11 mg/dL (ref 6–20)
CO2: 16 mmol/L — ABNORMAL LOW (ref 22–32)
CO2: 15 mmol/L — ABNORMAL LOW (ref 22–32)
Calcium: 8.2 mg/dL — ABNORMAL LOW (ref 8.9–10.3)
Calcium: 8 mg/dL — ABNORMAL LOW (ref 8.9–10.3)
Chloride: 103 mmol/L (ref 98–111)
Chloride: 105 mmol/L (ref 98–111)
Creatinine, Ser: 0.96 mg/dL (ref 0.44–1.00)
Creatinine, Ser: 1 mg/dL (ref 0.44–1.00)
GFR calc Af Amer: >60 mL/min (ref >60)
GFR calc Af Amer: >60 mL/min (ref >60)
GFR calc non Af Amer: >60 mL/min (ref >60)
GFR calc non Af Amer: >60 mL/min (ref >60)
Glucose, Bld: 119 mg/dL — ABNORMAL HIGH (ref 70–99)
Glucose, Bld: 156 mg/dL — ABNORMAL HIGH (ref 70–99)
Potassium: 4.5 mmol/L (ref 3.5–5.1)
Potassium: 4.3 mmol/L (ref 3.5–5.1)
SODIUM: 133 mmol/L — AB (ref 135–145)
Sodium: 134 mmol/L — ABNORMAL LOW (ref 135–145)

## 2018-05-09 LAB — GLUCOSE, CAPILLARY
GLUCOSE-CAPILLARY: 133 mg/dL — AB (ref 70–99)
GLUCOSE-CAPILLARY: 227 mg/dL — AB (ref 70–99)
GLUCOSE-CAPILLARY: 233 mg/dL — AB (ref 70–99)
Glucose-Capillary: 164 mg/dL — ABNORMAL HIGH (ref 70–99)
Glucose-Capillary: 116 mg/dL — ABNORMAL HIGH (ref 70–99)
Glucose-Capillary: 102 mg/dL — ABNORMAL HIGH (ref 70–99)
Glucose-Capillary: 153 mg/dL — ABNORMAL HIGH (ref 70–99)
Glucose-Capillary: 186 mg/dL — ABNORMAL HIGH (ref 70–99)
Glucose-Capillary: 166 mg/dL — ABNORMAL HIGH (ref 70–99)
Glucose-Capillary: 119 mg/dL — ABNORMAL HIGH (ref 70–99)
Glucose-Capillary: 273 mg/dL — ABNORMAL HIGH (ref 70–99)
Glucose-Capillary: 164 mg/dL — ABNORMAL HIGH (ref 70–99)

## 2018-05-09 LAB — CBC
HCT: 25.5 % — ABNORMAL LOW (ref 36.0–46.0)
Hemoglobin: 8.1 g/dL — ABNORMAL LOW (ref 12.0–15.0)
MCH: 29.2 pg (ref 26.0–34.0)
MCHC: 31.8 g/dL (ref 30.0–36.0)
MCV: 92.1 fL (ref 80.0–100.0)
Platelets: 259 10*3/uL (ref 150–400)
RBC: 2.77 MIL/uL — ABNORMAL LOW (ref 3.87–5.11)
RDW: 13.2 % (ref 11.5–15.5)
WBC: 12.3 10*3/uL — ABNORMAL HIGH (ref 4.0–10.5)
nRBC: 0 % (ref 0.0–0.2)

## 2018-05-09 LAB — MRSA PCR SCREENING: MRSA by PCR: NEGATIVE

## 2018-05-09 LAB — HIV ANTIBODY (ROUTINE TESTING W REFLEX): HIV Screen 4th Generation wRfx: NONREACTIVE

## 2018-05-09 LAB — SEDIMENTATION RATE: Sed Rate: 105 mm/hr — ABNORMAL HIGH (ref 0–22)

## 2018-05-09 LAB — C-REACTIVE PROTEIN: CRP: 8.6 mg/dL — ABNORMAL HIGH (ref <1.0)

## 2018-05-09 MED ORDER — ACETAMINOPHEN 325 MG PO TABS
650.0000 mg | ORAL_TABLET | Freq: Four times a day (QID) | ORAL | Status: DC | PRN
  Administered 2018-05-10 – 2018-05-20 (×8): 650 mg via ORAL
  Filled 2018-05-09 (×10): qty 2

## 2018-05-09 MED ORDER — INFLUENZA VAC SPLIT QUAD 0.5 ML IM SUSY
0.5000 mL | PREFILLED_SYRINGE | INTRAMUSCULAR | Status: AC
  Administered 2018-05-12: 0.5 mL via INTRAMUSCULAR
  Filled 2018-05-09: qty 0.5

## 2018-05-09 MED ORDER — PNEUMOCOCCAL VAC POLYVALENT 25 MCG/0.5ML IJ INJ
0.5000 mL | INJECTION | INTRAMUSCULAR | Status: AC
  Administered 2018-05-12: 0.5 mL via INTRAMUSCULAR
  Filled 2018-05-09: qty 0.5

## 2018-05-09 MED ORDER — INSULIN ASPART 100 UNIT/ML ~~LOC~~ SOLN
0.0000 [IU] | Freq: Every day | SUBCUTANEOUS | Status: DC
  Administered 2018-05-11: 2 [IU] via SUBCUTANEOUS
  Administered 2018-05-20: 3 [IU] via SUBCUTANEOUS

## 2018-05-09 MED ORDER — SODIUM CHLORIDE 0.9 % IV SOLN
INTRAVENOUS | Status: DC
  Administered 2018-05-09 – 2018-05-10 (×2): via INTRAVENOUS

## 2018-05-09 MED ORDER — SODIUM CHLORIDE 0.9 % IV BOLUS
1000.0000 mL | Freq: Once | INTRAVENOUS | Status: AC
  Administered 2018-05-09: 1000 mL via INTRAVENOUS

## 2018-05-09 MED ORDER — FLUCONAZOLE 200 MG PO TABS
200.0000 mg | ORAL_TABLET | Freq: Once | ORAL | Status: AC
  Administered 2018-05-09: 200 mg via ORAL
  Filled 2018-05-09: qty 1

## 2018-05-09 MED ORDER — SODIUM CHLORIDE 0.9 % IV SOLN
1.0000 g | Freq: Two times a day (BID) | INTRAVENOUS | Status: DC
  Administered 2018-05-09 (×2): 1 g via INTRAVENOUS
  Filled 2018-05-09 (×2): qty 1

## 2018-05-09 MED ORDER — INSULIN ASPART 100 UNIT/ML ~~LOC~~ SOLN
0.0000 [IU] | Freq: Three times a day (TID) | SUBCUTANEOUS | Status: DC
  Administered 2018-05-09 (×2): 8 [IU] via SUBCUTANEOUS
  Administered 2018-05-10: 5 [IU] via SUBCUTANEOUS
  Administered 2018-05-10: 3 [IU] via SUBCUTANEOUS
  Administered 2018-05-11 – 2018-05-12 (×4): 2 [IU] via SUBCUTANEOUS
  Administered 2018-05-13: 3 [IU] via SUBCUTANEOUS
  Administered 2018-05-13: 2 [IU] via SUBCUTANEOUS
  Administered 2018-05-13: 11 [IU] via SUBCUTANEOUS
  Administered 2018-05-14 (×2): 2 [IU] via SUBCUTANEOUS
  Administered 2018-05-14: 8 [IU] via SUBCUTANEOUS
  Administered 2018-05-15 (×2): 3 [IU] via SUBCUTANEOUS
  Administered 2018-05-15 – 2018-05-16 (×2): 5 [IU] via SUBCUTANEOUS
  Administered 2018-05-17 (×3): 2 [IU] via SUBCUTANEOUS
  Administered 2018-05-19: 3 [IU] via SUBCUTANEOUS
  Administered 2018-05-19: 2 [IU] via SUBCUTANEOUS
  Administered 2018-05-19: 5 [IU] via SUBCUTANEOUS
  Administered 2018-05-20 (×2): 3 [IU] via SUBCUTANEOUS
  Administered 2018-05-21: 2 [IU] via SUBCUTANEOUS
  Administered 2018-05-21: 3 [IU] via SUBCUTANEOUS

## 2018-05-09 MED ORDER — SODIUM CHLORIDE 0.9 % IV SOLN: INTRAVENOUS | Status: DC

## 2018-05-09 MED ORDER — ENSURE ENLIVE PO LIQD
237.0000 mL | Freq: Two times a day (BID) | ORAL | Status: DC
  Administered 2018-05-09 – 2018-05-21 (×19): 237 mL via ORAL

## 2018-05-09 MED ORDER — LACTATED RINGERS IV SOLN
INTRAVENOUS | Status: DC
  Administered 2018-05-09: 08:00:00 via INTRAVENOUS

## 2018-05-09 MED ORDER — SODIUM CHLORIDE 0.9 % IV SOLN
2.0000 g | Freq: Two times a day (BID) | INTRAVENOUS | Status: DC
  Administered 2018-05-09 – 2018-05-10 (×3): 2 g via INTRAVENOUS
  Filled 2018-05-09 (×4): qty 2

## 2018-05-09 MED ORDER — ACETAMINOPHEN 650 MG RE SUPP: 650.0000 mg | Freq: Four times a day (QID) | RECTAL | Status: DC | PRN

## 2018-05-09 MED ORDER — FLUCONAZOLE 100 MG PO TABS
100.0000 mg | ORAL_TABLET | Freq: Every day | ORAL | Status: DC
  Administered 2018-05-10 – 2018-05-13 (×4): 100 mg via ORAL
  Filled 2018-05-09 (×5): qty 1

## 2018-05-09 MED ORDER — HEPARIN SODIUM (PORCINE) 5000 UNIT/ML IJ SOLN
5000.0000 [IU] | Freq: Three times a day (TID) | INTRAMUSCULAR | Status: DC
  Administered 2018-05-09 – 2018-05-21 (×34): 5000 [IU] via SUBCUTANEOUS
  Filled 2018-05-09 (×33): qty 1

## 2018-05-09 MED ORDER — INSULIN GLARGINE 100 UNIT/ML ~~LOC~~ SOLN
15.0000 [IU] | SUBCUTANEOUS | Status: DC
  Administered 2018-05-09 – 2018-05-10 (×2): 15 [IU] via SUBCUTANEOUS
  Filled 2018-05-09 (×3): qty 0.15
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
## 2018-05-09 NOTE — Progress Notes (Signed)
Chaplain prayed with patient after listening to concerns.  Shared New Testament with her her request. Conard Novak, [place]   05/09/18 1600  Clinical Encounter Type  Visited With Patient  Visit Type Initial;Spiritual support;Other (Comment) (asked for Bible)  Referral From Nurse  Consult/Referral To Chaplain  Spiritual Encounters  Spiritual Needs Prayer;Emotional  Stress Factors  Patient Stress Factors Family relationships;Financial concerns;Health changes  Family Stress Factors Family relationships;Health changes

## 2018-05-09 NOTE — Progress Notes (Signed)
Pharmacy Antibiotic Note  Gina [name] is a 58 y.o. female admitted on 05/08/2018 with osteomyelitis.  Pharmacy has been consulted for cefepime and vancomycin dosing. Pt with osteomyelitis and slight decrease in Scr. Pt is afebrile but WBC is elevated.   Plan: Increase cefepime to 2gm IV Q12H Continue vancomycin 1gm IV Q24H F/u renal fxn, C&S, clinical status and peak/trough at SS  Height: 5\' 2"  (157.5 cm) Weight: 131 lb 13.4 oz (59.8 kg) IBW/kg (Calculated) : 50.1  Temp (24hrs), Avg:97.2 F (36.2 C), Min:93.9 F (34.4 C), Max:98.4 F (36.9 C)  Recent Labs  Lab 05/08/18 1306 05/09/18 0408 05/09/18 0533  WBC 12.4* 12.3*  --   CREATININE 1.20* 0.96 1.00  LATICACIDVEN 1.1  --   --     Estimated Creatinine Clearance: 49.1 mL/min (by C-G formula based on SCr of 1 mg/dL).    Thank you for allowing pharmacy to be a part of this patient's care.  Salome Arnt, PharmD, BCPS Please see AMION for all pharmacy numbers 05/09/2018 10:33 AM

## 2018-05-09 NOTE — Progress Notes (Signed)
Patient has not voided since this AM due to an In and Out cath. Bladder scanner showed 395 mL. Called Dr. Loleta Books and informed him of lack of voiding and bladder scanner results. He stated he would place orders for more fluids to be given and we can follow up on voiding after more fluid given. Will continue to monitor.
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
## 2018-05-09 NOTE — Consult Note (Signed)
Reason for Consult:Left heel ulcer Referring Physician: C Danford  Gina [name] is an 58 y.o. female.  HPI: Gina [name] came to the hospital for a foot infection. She was told this by the place where she gets her prosthetic. She admitted to fevers and chills but denied pain and was unaware there was anything wrong with her foot. She has not been treating her DM at home nor checking her blood sugars. A1c today is unmeasurably high. She was reluctant to give any history on admission last night and would not get off the phone to talk with me or her primary today during our visit. The history was gleaned from the chart.  Past Medical History:  Diagnosis Date  . Diabetes mellitus   . Hypertension   . Osteomyelitis of right foot ([place]) 02/22/2017  . Schizophrenia ([place])   . Septic arthritis of interphalangeal joint of toe of left foot ([place]) 06/02/2016  . Stress incontinence 04/26/2017    Past Surgical History:  Procedure Laterality Date  . AMPUTATION Left 06/05/2016   Procedure: LEFT FOOT TRANSMETATARSAL AMPUTATION;  Surgeon: [name], MD;  Location: [facility];  Service: Orthopedics;  Laterality: Left;  . AMPUTATION Right 11/11/2017   Procedure: AMPUTATION BELOW KNEE;  Surgeon: [name], MD;  Location: [facility];  Service: Orthopedics;  Laterality: Right;  . AMPUTATION TOE Right 02/23/2017   Procedure: AMPUTATION RIGHT THIRD TOE;  Surgeon: [name], MD;  Location: [facility];  Service: Orthopedics;  Laterality: Right;  . I&D EXTREMITY Right 11/09/2017   Procedure: Debride Ulcer Right Heel;  Surgeon: [name], MD;  Location: [facility];  Service: Orthopedics;  Laterality: Right;  . TUBAL LIGATION      Family History  Problem Relation Age of Onset  . Diabetes type II Father   . CAD Father   . Prostate cancer Father   . Diabetes Mellitus II Brother     Social History:  reports that she has never smoked. She has never used smokeless tobacco. She reports that she does not drink alcohol or  use drugs.  Allergies:  Allergies  Allergen Reactions  . Metformin And Related Other (See Comments)    Upset stomach    Medications: I have reviewed the patient's current medications.  Results for orders placed or performed during the hospital encounter of 05/08/18 (from the past 48 hour(s))  Lactic acid, plasma     Status: None   Collection Time: 05/08/18  1:06 PM  Result Value Ref Range   Lactic Acid, Venous 1.1 0.5 - 1.9 mmol/L    Comment: Performed at [facility], [place] [address], [place], [place] [address]  Comprehensive metabolic panel     Status: Abnormal   Collection Time: 05/08/18  1:06 PM  Result Value Ref Range   Sodium 132 (L) 135 - 145 mmol/L   Potassium 4.2 3.5 - 5.1 mmol/L   Chloride 94 (L) 98 - 111 mmol/L   CO2 14 (L) 22 - 32 mmol/L   Glucose, Bld 468 (H) 70 - 99 mg/dL   BUN 9 6 - 20 mg/dL   Creatinine, Ser 1.20 (H) 0.44 - 1.00 mg/dL   Calcium 9.3 8.9 - 10.3 mg/dL   Total Protein 8.1 6.5 - 8.1 g/dL   Albumin 2.7 (L) 3.5 - 5.0 g/dL   AST 17 15 - 41 U/L   ALT 19 0 - 44 U/L   Alkaline Phosphatase 104 38 - 126 U/L   Total Bilirubin 1.3 (H) 0.3 -  1.2 mg/dL   GFR calc non Af Amer 50 (L) >60 mL/min   GFR calc Af Amer 58 (L) >60 mL/min   Anion gap 24 (H) 5 - 15    Comment: Performed at [facility] [address] [place], [place] [address]  CBC with Differential     Status: Abnormal   Collection Time: 05/08/18  1:06 PM  Result Value Ref Range   WBC 12.4 (H) 4.0 - 10.5 K/uL   RBC 3.41 (L) 3.87 - 5.11 MIL/uL   Hemoglobin 10.3 (L) 12.0 - 15.0 g/dL   HCT 33.2 (L) 36.0 - 46.0 %   MCV 97.4 80.0 - 100.0 fL   MCH 30.2 26.0 - 34.0 pg   MCHC 31.0 30.0 - 36.0 g/dL   RDW 13.5 11.5 - 15.5 %   Platelets 324 150 - 400 K/uL   nRBC 0.0 0.0 - 0.2 %   Neutrophils Relative % 81 %   Neutro Abs 10.0 (H) 1.7 - 7.7 K/uL   Lymphocytes Relative 12 %   Lymphs Abs 1.4 0.7 - 4.0 K/uL   Monocytes Relative 6 %   Monocytes Absolute 0.8 0.1 - 1.0 K/uL   Eosinophils  Relative 0 %   Eosinophils Absolute 0.0 0.0 - 0.5 K/uL   Basophils Relative 0 %   Basophils Absolute 0.0 0.0 - 0.1 K/uL   Immature Granulocytes 1 %   Abs Immature Granulocytes 0.09 (H) 0.00 - 0.07 K/uL    Comment: Performed at [facility] [address], [place], [place] [address]  I-Stat beta hCG blood, ED     Status: Abnormal   Collection Time: 05/08/18  1:10 PM  Result Value Ref Range   I-stat hCG, quantitative 6.1 (H) <5 mIU/mL   Comment 3            Comment:   GEST. AGE      CONC.  (mIU/mL)   <=1 WEEK        5 - 50     2 WEEKS       50 - 500     3 WEEKS       100 - 10,000     4 WEEKS     1,000 - 30,000        FEMALE AND NON-PREGNANT FEMALE:     LESS THAN 5 mIU/mL   CBG monitoring, ED     Status: Abnormal   Collection Time: 05/08/18 10:24 PM  Result Value Ref Range   Glucose-Capillary 409 (H) 70 - 99 mg/dL  CBG monitoring, ED     Status: Abnormal   Collection Time: 05/08/18 11:46 PM  Result Value Ref Range   Glucose-Capillary 339 (H) 70 - 99 mg/dL  MRSA PCR Screening     Status: None   Collection Time: 05/09/18 12:50 AM  Result Value Ref Range   MRSA by PCR NEGATIVE NEGATIVE    Comment:        The GeneXpert MRSA Assay (FDA approved for NASAL specimens only), is one component of a comprehensive MRSA colonization surveillance program. It is not intended to diagnose MRSA infection nor to guide or monitor treatment for MRSA infections. Performed at [facility], [place] [address], [place], [place] [address]   Glucose, capillary     Status: Abnormal   Collection Time: 05/09/18 12:56 AM  Result Value Ref Range   Glucose-Capillary 233 (H) 70 - 99 mg/dL  Glucose, capillary     Status: Abnormal  Collection Time: 05/09/18  1:51 AM  Result Value Ref Range   Glucose-Capillary 164 (H) 70 - 99 mg/dL  Glucose, capillary     Status: Abnormal   Collection Time: 05/09/18  2:55 AM  Result Value Ref Range   Glucose-Capillary 116 (H) 70 - 99 mg/dL  Glucose, capillary      Status: Abnormal   Collection Time: 05/09/18  3:43 AM  Result Value Ref Range   Glucose-Capillary 102 (H) 70 - 99 mg/dL  Basic metabolic panel     Status: Abnormal   Collection Time: 05/09/18  4:08 AM  Result Value Ref Range   Sodium 133 (L) 135 - 145 mmol/L   Potassium 4.5 3.5 - 5.1 mmol/L   Chloride 103 98 - 111 mmol/L   CO2 16 (L) 22 - 32 mmol/L   Glucose, Bld 119 (H) 70 - 99 mg/dL   BUN 11 6 - 20 mg/dL   Creatinine, Ser 0.96 0.44 - 1.00 mg/dL   Calcium 8.2 (L) 8.9 - 10.3 mg/dL   GFR calc non Af Amer >60 >60 mL/min   GFR calc Af Amer >60 >60 mL/min   Anion gap 14 5 - 15    Comment: Performed at [facility], [place] [address], [place], [place] [address]  CBC     Status: Abnormal   Collection Time: 05/09/18  4:08 AM  Result Value Ref Range   WBC 12.3 (H) 4.0 - 10.5 K/uL   RBC 2.77 (L) 3.87 - 5.11 MIL/uL   Hemoglobin 8.1 (L) 12.0 - 15.0 g/dL    Comment: REPEATED TO VERIFY DELTA CHECK NOTED    HCT 25.5 (L) 36.0 - 46.0 %   MCV 92.1 80.0 - 100.0 fL   MCH 29.2 26.0 - 34.0 pg   MCHC 31.8 30.0 - 36.0 g/dL   RDW 13.2 11.5 - 15.5 %   Platelets 259 150 - 400 K/uL   nRBC 0.0 0.0 - 0.2 %    Comment: Performed at [facility], [place] [address] [place], [place] [address]  Hemoglobin A1c     Status: Abnormal   Collection Time: 05/09/18  4:08 AM  Result Value Ref Range   Hgb A1c MFr Bld >18.5 (H) 4.8 - 5.6 %    Comment: REPEATED TO VERIFY   Mean Plasma Glucose NOT CALCULATED mg/dL    Comment: Performed at [facility] [address] [place], [place] [address]  Sedimentation rate     Status: Abnormal   Collection Time: 05/09/18  4:08 AM  Result Value Ref Range   Sed Rate 105 (H) 0 - 22 mm/hr    Comment: Performed at [facility] [address], [place], [place] [address]  C-reactive protein     Status: Abnormal   Collection Time: 05/09/18  4:08 AM  Result Value Ref Range   CRP 8.6 (H) <1.0 mg/dL    Comment: Performed at [facility]  [address], [place], [place] [address]  Glucose, capillary     Status: Abnormal   Collection Time: 05/09/18  4:46 AM  Result Value Ref Range   Glucose-Capillary 133 (H) 70 - 99 mg/dL  Basic metabolic panel     Status: Abnormal   Collection Time: 05/09/18  5:33 AM  Result Value Ref Range   Sodium 134 (L) 135 - 145 mmol/L   Potassium 4.3 3.5 - 5.1 mmol/L   Chloride 105 98 - 111 mmol/L   CO2 15 (L) 22 -  32 mmol/L   Glucose, Bld 156 (H) 70 - 99 mg/dL   BUN 11 6 - 20 mg/dL   Creatinine, Ser 1.00 0.44 - 1.00 mg/dL   Calcium 8.0 (L) 8.9 - 10.3 mg/dL   GFR calc non Af Amer >60 >60 mL/min   GFR calc Af Amer >60 >60 mL/min   Anion gap 14 5 - 15    Comment: Performed at [facility] [address] [place], [place] [address]  Glucose, capillary     Status: Abnormal   Collection Time: 05/09/18  5:46 AM  Result Value Ref Range   Glucose-Capillary 153 (H) 70 - 99 mg/dL  Glucose, capillary     Status: Abnormal   Collection Time: 05/09/18  6:51 AM  Result Value Ref Range   Glucose-Capillary 186 (H) 70 - 99 mg/dL  Urinalysis, Routine w reflex microscopic     Status: Abnormal   Collection Time: 05/09/18  6:53 AM  Result Value Ref Range   Color, Urine YELLOW YELLOW   APPearance CLEAR CLEAR   Specific Gravity, Urine 1.018 1.005 - 1.030   pH 5.0 5.0 - 8.0   Glucose, UA >=500 (A) NEGATIVE mg/dL   Hgb urine dipstick MODERATE (A) NEGATIVE   Bilirubin Urine NEGATIVE NEGATIVE   Ketones, ur 80 (A) NEGATIVE mg/dL   Protein, ur 30 (A) NEGATIVE mg/dL   Nitrite NEGATIVE NEGATIVE   Leukocytes,Ua NEGATIVE NEGATIVE   RBC / HPF 6-10 0 - 5 RBC/hpf   WBC, UA 0-5 0 - 5 WBC/hpf   Bacteria, UA RARE (A) NONE SEEN   Squamous Epithelial / LPF 0-5 0 - 5    Comment: Performed at [facility], [address] [place], [place] [address]    Dg Ankle Complete Left  Result Date: 05/08/2018 CLINICAL DATA:  Initial evaluation for acute left foot pain, history of prior amputation, diabetes. EXAM: LEFT ANKLE  COMPLETE - 3+ VIEW COMPARISON:  Prior MRI from 05/04/2016. FINDINGS: Patient is status post transmetatarsal amputation of the left forefoot. Amputated margin well-corticated without acute abnormality. Prominent soft tissue ulceration seen at the posterior and plantar aspect of the heel/hindfoot. Focal osseous erosion of the underlying posterior calcaneus, concerning for associated osteomyelitis. No dissecting soft tissue emphysema. No acute fracture or dislocation. Posterior and plantar calcaneal enthesophytes noted. Diffuse osteopenia. IMPRESSION: 1. Large soft tissue ulceration at the posterior aspect of the left he will/hindfoot. Osseous erosion at the posterior aspect of the underlying calcaneus compatible with associated osteomyelitis. 2. Prior trans metatarsal amputation of the left forefoot. No other acute osseous abnormality. Electronically Signed   By: Jeannine Boga M.D.   On: 05/08/2018 22:24   Dg Knee Right Port  Result Date: 05/08/2018 CLINICAL DATA:  Initial evaluation for acute pain, rule out infection. Prior BKA. EXAM: PORTABLE RIGHT KNEE - 1-2 VIEW COMPARISON:  None available. FINDINGS: Patient status post below-the-knee amputation of the right leg. Mild diffuse soft tissue swelling about the amputation stump without appreciable focal soft tissue ulceration. No dissecting soft tissue emphysema. No radiographic evidence for acute osteomyelitis. No fracture or dislocation. Underlying osteopenia noted. IMPRESSION: 1. Sequelae of prior right BKA. Mild diffuse soft tissue swelling about the amputation stump without focal soft tissue ulceration. No radiographic evidence for osteomyelitis. 2. No other acute osseous abnormality. Electronically Signed   By: Jeannine Boga M.D.   On: 05/08/2018 22:27    Review of Systems  Unable to perform ROS: Other   Blood pressure 100/65, pulse 91, temperature 98.4  F (36.9 C), temperature source Oral, resp. rate 16, height 5\' 2"  (1.575 m), weight  59.8 kg, last menstrual period 03/29/2015, SpO2 100 %. Physical Exam  Constitutional: She appears well-developed and well-nourished. No distress.  HENT:  Head: Normocephalic and atraumatic.  Eyes: Conjunctivae are normal. Right eye exhibits no discharge. Left eye exhibits no discharge. No scleral icterus.  Neck: Normal range of motion.  Cardiovascular: Normal rate and regular rhythm.  Respiratory: Effort normal. No respiratory distress.  Musculoskeletal:     Comments: LLE No traumatic wounds, ecchymosis, or rash  S/p TMT amputation, large calceneal ulceration with purulent discharge, black eschar, malodor  No knee or ankle effusion  Knee stable to varus/ valgus and anterior/posterior stress  Sens DPN, SPN, TN could not assess  Motor ext, flex, evers could not assess  Neurological: She is alert.  Skin: Skin is warm and dry. She is not diaphoretic.  Psychiatric: She has a normal mood and affect. Her behavior is normal.    Assessment/Plan: Left calcaneal ulceration with osteomyelitis -- Will need BKA this admission if she consents to it. Dr. Sharol Given to evaluate. [place] for diet today. Multiple medical problems including type 2 diabetes, hypertension, schizophrenia, status post right BKA and left metatarsal amputation -- per primary    Lisette Abu, PA-C Orthopedic Surgery [phone] 05/09/2018, 8:50 AM

## 2018-05-09 NOTE — Progress Notes (Signed)
PROGRESS NOTE    Gina [name]  WUJ:[id number] DOB: December 19, 1960 DOA: 05/08/2018 PCP: Alfonse Spruce, FNP      Brief Narrative:  Gina [name] is a 58 y.o. F with DM, schizophrenia, HTN and recurrent foot amputations (and right BKA) who presented from her prosthetic clinic for redness of the foot and pain in setting of fever, chills.     Assessment & Plan:  Sepsis from diabetic foot infection Blood cultures no growth.  The left foot appears non-viable.  BP still soft -Continue vancomycin and Cefepime -Continue IV fluids -Consult orthopedics, appreciate cares   Diabetic ketoacidosis Diabetes, poorly controlled, insulin dependent, with peripheral neuropathy Gap closed overnight.   -Start Lantus -Overlap insulin gtt 2 hrs, start diet, then stop drip -SSI corrections -Hold home metformin, januvia  Hypertension BP soft -Hold lisinopril, amlodipine, Lasix  AKI Resolved with fluids  Anemia of chronic disease Stable relative to baseline, slight hemodilution drop overnight.   Other medications -Hold SSRI for now  Vaginal candidiasis Severe. -Start fluconazole PO for 14 days  Schizophrenia Not currently on disease modifying therapy.  No active psychosis, but she appears disorganized and possibly manic. -Consult Psychiatry for med mgmt and ?mania      MDM and disposition: The below labs and imaging reports were reviewed and summarized above.  Medication management as above.  The patient was admitted with DKA and cellulitis of the foot with sepsis.  Sepsis resolving.  Still BP soft.  Hold antihypertensivs and give fluids and antibiotics.  Orthopedics to evaluate for amputation.  This is a severe acute illness posing a threat to life, limb       DVT prophylaxis: Heparin Code Status: FULL Family Communication: None present    Consultants:   Psychiatry  Orthopedics  Procedures:   None  Antimicrobials:   Vancomycin 2/26 >>  Zosyn 2.26 >>     Subjective: Perseverating on her door locks at home.  No fever, vomiting, chest pain.  Leg hurts.  Rash in groin is painful.  No other complaints.  Objective: Vitals:   05/09/18 1100 05/09/18 1133 05/09/18 1200 05/09/18 1300  BP:  127/65 133/68   Pulse: 98 99 98 (!) 106  Resp: (!) 8 12 18 19   Temp:  98.8 F (37.1 C)    TempSrc:  Oral    SpO2: 100% 100% 100% 100%  Weight:      Height:        Intake/Output Summary (Last 24 hours) at 05/09/2018 1431 Last data filed at 05/09/2018 1300 Gross per 24 hour  Intake 3540.55 ml  Output 425 ml  Net 3115.55 ml   Filed Weights   05/08/18 1255 05/09/18 0041  Weight: 68 kg 59.8 kg    Examination: General appearance:  adult female, alert and in no acute distress.  Talking on phone.   HEENT: Anicteric, conjunctiva pink, lids and lashes normal. No nasal deformity, discharge, epistaxis.  Lips moist, edentulous, OP moist, no oral lesions, hearing normal.   Skin: Warm and dry.  no jaundice.  No suspicious rashes or lesions. Cardiac: RRR, nl S1-S2, no murmurs appreciated.  Capillary refill is brisk.  JVP not visible.  Radia  pulses 2+ and symmetric.  Distal left pulse poor. Respiratory: Normal respiratory rate and rhythm.  CTAB without rales or wheezes. Abdomen: Abdomen soft.  no TTP. No ascites, distension, hepatosplenomegaly.   MSK: No deformities or effusions.  Right BKA.  Left transmet amputation, discoloration of the whole foot, appears nonviable. Neuro: Awake and alert, talking  on phone.  Speech is pressured and incessant, hard to redirect.  EOMI, moves all extremities. Speech fluent.    Psych: Sensorium intact and responding to questions, attention somewhat off. Affect flat.  Judgment and insight appear impaired.  No visual or auditory hallucinations.    Data Reviewed: I have personally reviewed following labs and imaging studies:  CBC: Recent Labs  Lab 05/08/18 1306 05/09/18 0408  WBC 12.4* 12.3*  NEUTROABS 10.0*  --   HGB  10.3* 8.1*  HCT 33.2* 25.5*  MCV 97.4 92.1  PLT 324 258   Basic Metabolic Panel: Recent Labs  Lab 05/08/18 1306 05/09/18 0408 05/09/18 0533  NA 132* 133* 134*  K 4.2 4.5 4.3  CL 94* 103 105  CO2 14* 16* 15*  GLUCOSE 468* 119* 156*  BUN 9 11 11   CREATININE 1.20* 0.96 1.00  CALCIUM 9.3 8.2* 8.0*   GFR: Estimated Creatinine Clearance: 49.1 mL/min (by C-G formula based on SCr of 1 mg/dL). Liver Function Tests: Recent Labs  Lab 05/08/18 1306  AST 17  ALT 19  ALKPHOS 104  BILITOT 1.3*  PROT 8.1  ALBUMIN 2.7*   No results for input(s): LIPASE, AMYLASE in the last 168 hours. No results for input(s): AMMONIA in the last 168 hours. Coagulation Profile: No results for input(s): INR, PROTIME in the last 168 hours. Cardiac Enzymes: No results for input(s): CKTOTAL, CKMB, CKMBINDEX, TROPONINI in the last 168 hours. BNP (last 3 results) No results for input(s): PROBNP in the last 8760 hours. HbA1C: Recent Labs    05/09/18 0408  HGBA1C >18.5*   CBG: Recent Labs  Lab 05/09/18 0546 05/09/18 0651 05/09/18 0758 05/09/18 0911 05/09/18 1144  GLUCAP 153* 186* 166* 119* 227*   Lipid Profile: No results for input(s): CHOL, HDL, LDLCALC, TRIG, CHOLHDL, LDLDIRECT in the last 72 hours. Thyroid Function Tests: No results for input(s): TSH, T4TOTAL, FREET4, T3FREE, THYROIDAB in the last 72 hours. Anemia Panel: No results for input(s): VITAMINB12, FOLATE, FERRITIN, TIBC, IRON, RETICCTPCT in the last 72 hours. Urine analysis:    Component Value Date/Time   COLORURINE YELLOW 05/09/2018 [place] 05/09/2018 0653   LABSPEC 1.018 05/09/2018 0653   PHURINE 5.0 05/09/2018 0653   GLUCOSEU >=500 (A) 05/09/2018 0653   HGBUR MODERATE (A) 05/09/2018 0653   BILIRUBINUR NEGATIVE 05/09/2018 0653   BILIRUBINUR N 04/28/2016 1603   KETONESUR 80 (A) 05/09/2018 0653   PROTEINUR 30 (A) 05/09/2018 0653   UROBILINOGEN 0.2 04/28/2016 1603   UROBILINOGEN 0.2 06/18/2013 0650    NITRITE NEGATIVE 05/09/2018 0653   LEUKOCYTESUR NEGATIVE 05/09/2018 0653   Sepsis Labs: @LABRCNTIP (procalcitonin:4,lacticacidven:4)  ) Recent Results (from the past 240 hour(s))  Culture, blood (routine x 2)     Status: None (Preliminary result)   Collection Time: 05/08/18  9:48 PM  Result Value Ref Range Status   Specimen Description SITE NOT SPECIFIED  Final   Special Requests   Final    BOTTLES DRAWN AEROBIC AND ANAEROBIC Blood Culture results may not be optimal due to an excessive volume of blood received in culture bottles   Culture   Final    NO GROWTH < 24 HOURS Performed at [facility], [place] [address], [place], [place] [address]    Report Status PENDING  Incomplete  Culture, blood (routine x 2)     Status: None (Preliminary result)   Collection Time: 05/08/18 10:11 PM  Result Value Ref Range Status   Specimen Description BLOOD RIGHT FOREARM  Final  Special Requests   Final    BOTTLES DRAWN AEROBIC AND ANAEROBIC Blood Culture results may not be optimal due to an inadequate volume of blood received in culture bottles   Culture   Final    NO GROWTH < 24 HOURS Performed at [facility] [address] [place], [place] [address]    Report Status PENDING  Incomplete  MRSA PCR Screening     Status: None   Collection Time: 05/09/18 12:50 AM  Result Value Ref Range Status   MRSA by PCR NEGATIVE NEGATIVE Final    Comment:        The GeneXpert MRSA Assay (FDA approved for NASAL specimens only), is one component of a comprehensive MRSA colonization surveillance program. It is not intended to diagnose MRSA infection nor to guide or monitor treatment for MRSA infections. Performed at [facility], [place] [address] [place], [place] [address]          Radiology Studies: Dg Ankle Complete Left  Result Date: 05/08/2018 CLINICAL DATA:  Initial evaluation for acute left foot pain, history of prior amputation, diabetes. EXAM: LEFT ANKLE COMPLETE - 3+ VIEW  COMPARISON:  Prior MRI from 05/04/2016. FINDINGS: Patient is status post transmetatarsal amputation of the left forefoot. Amputated margin well-corticated without acute abnormality. Prominent soft tissue ulceration seen at the posterior and plantar aspect of the heel/hindfoot. Focal osseous erosion of the underlying posterior calcaneus, concerning for associated osteomyelitis. No dissecting soft tissue emphysema. No acute fracture or dislocation. Posterior and plantar calcaneal enthesophytes noted. Diffuse osteopenia. IMPRESSION: 1. Large soft tissue ulceration at the posterior aspect of the left he will/hindfoot. Osseous erosion at the posterior aspect of the underlying calcaneus compatible with associated osteomyelitis. 2. Prior trans metatarsal amputation of the left forefoot. No other acute osseous abnormality. Electronically Signed   By: Jeannine Boga M.D.   On: 05/08/2018 22:24   Dg Knee Right Port  Result Date: 05/08/2018 CLINICAL DATA:  Initial evaluation for acute pain, rule out infection. Prior BKA. EXAM: PORTABLE RIGHT KNEE - 1-2 VIEW COMPARISON:  None available. FINDINGS: Patient status post below-the-knee amputation of the right leg. Mild diffuse soft tissue swelling about the amputation stump without appreciable focal soft tissue ulceration. No dissecting soft tissue emphysema. No radiographic evidence for acute osteomyelitis. No fracture or dislocation. Underlying osteopenia noted. IMPRESSION: 1. Sequelae of prior right BKA. Mild diffuse soft tissue swelling about the amputation stump without focal soft tissue ulceration. No radiographic evidence for osteomyelitis. 2. No other acute osseous abnormality. Electronically Signed   By: Jeannine Boga M.D.   On: 05/08/2018 22:27   US Abdomen Limited Ruq  Result Date: 05/09/2018 CLINICAL DATA:  58 year old female with elevated total bilirubin. EXAM: ULTRASOUND ABDOMEN LIMITED RIGHT UPPER QUADRANT COMPARISON:  None. FINDINGS: Gallbladder:  A 1.5 cm gallstone is identified. There is no evidence of gallbladder wall thickening, pericholecystic fluid or sonographic Murphy sign. Common bile duct: Diameter: 4.6 mm. No evidence of intrahepatic or extrahepatic biliary dilatation. Liver: No focal lesion identified. Within normal limits in parenchymal echogenicity. Portal vein is patent on color Doppler imaging with normal direction of blood flow towards the liver. IMPRESSION: 1. Cholelithiasis without evidence of acute cholecystitis. 2. No evidence of biliary dilatation. 3. Unremarkable liver. Electronically Signed   By: Margarette Canada M.D.   On: 05/09/2018 10:32        Scheduled Meds: . feeding supplement (ENSURE ENLIVE)  237 mL Oral BID BM  . [START ON 05/10/2018] fluconazole  100  mg Oral Daily  . heparin  5,000 Units Subcutaneous Q8H  . [START ON 05/10/2018] Influenza vac split quadrivalent PF  0.5 mL Intramuscular Tomorrow-1000  . insulin aspart  0-15 Units Subcutaneous TID WC  . insulin aspart  0-5 Units Subcutaneous QHS  . insulin glargine  15 Units Subcutaneous Q24H  . [START ON 05/10/2018] pneumococcal 23 valent vaccine  0.5 mL Intramuscular Tomorrow-1000   Continuous Infusions: . ceFEPime (MAXIPIME) IV    . insulin Stopped (05/09/18 0922)  . lactated ringers 100 mL/hr at 05/09/18 1300  . vancomycin       LOS: 1 day    Time spent: 35 minutes    Edwin Dada, MD [facility] 05/09/2018, 2:31 PM     Please page through [place]:  [email or web address] Password [email or web address] If 7PM-7AM, please contact night-coverage

## 2018-05-09 NOTE — Plan of Care (Signed)

## 2018-05-09 NOTE — Consult Note (Signed)
ORTHOPAEDIC CONSULTATION  REQUESTING PHYSICIAN: Edwin Dada, *  Chief Complaint: Gangrene osteomyelitis left heel  HPI: Gina [name] is a 58 y.o. female who presents with severe peripheral vascular disease diabetes hypertension who states that she fell outside and had to crawl back into the house she states that the crawling because the ulcer and infection.  Past Medical History:  Diagnosis Date  . Diabetes mellitus   . Hypertension   . Osteomyelitis of right foot ([place]) 02/22/2017  . Schizophrenia ([place])   . Septic arthritis of interphalangeal joint of toe of left foot ([place]) 06/02/2016  . Stress incontinence 04/26/2017   Past Surgical History:  Procedure Laterality Date  . AMPUTATION Left 06/05/2016   Procedure: LEFT FOOT TRANSMETATARSAL AMPUTATION;  Surgeon: [name], MD;  Location: [facility];  Service: Orthopedics;  Laterality: Left;  . AMPUTATION Right 11/11/2017   Procedure: AMPUTATION BELOW KNEE;  Surgeon: [name], MD;  Location: [facility];  Service: Orthopedics;  Laterality: Right;  . AMPUTATION TOE Right 02/23/2017   Procedure: AMPUTATION RIGHT THIRD TOE;  Surgeon: [name], MD;  Location: [facility];  Service: Orthopedics;  Laterality: Right;  . I&D EXTREMITY Right 11/09/2017   Procedure: Debride Ulcer Right Heel;  Surgeon: [name], MD;  Location: [facility];  Service: Orthopedics;  Laterality: Right;  . TUBAL LIGATION     Social History   Socioeconomic History  . Marital status: Single    Spouse name: Not on file  . Number of children: Not on file  . Years of education: Not on file  . Highest education level: Not on file  Occupational History  . Not on file  Social Needs  . Financial resource strain: Not on file  . Food insecurity:    Worry: Not on file    Inability: Not on file  . Transportation needs:    Medical: Not on file    Non-medical: Not on file  Tobacco Use  . Smoking status: Never Smoker  . Smokeless tobacco: Never Used    Substance and Sexual Activity  . Alcohol use: No    Comment: occasionally  . Drug use: No  . Sexual activity: Not Currently  Lifestyle  . Physical activity:    Days per week: Not on file    Minutes per session: Not on file  . Stress: Not on file  Relationships  . Social connections:    Talks on phone: Not on file    Gets together: Not on file    Attends religious service: Not on file    Active member of club or organization: Not on file    Attends meetings of clubs or organizations: Not on file    Relationship status: Not on file  Other Topics Concern  . Not on file  Social History Narrative  . Not on file   Family History  Problem Relation Age of Onset  . Diabetes type II Father   . CAD Father   . Prostate cancer Father   . Diabetes Mellitus II Brother    - negative except otherwise stated in the family history section Allergies  Allergen Reactions  . Metformin And Related Other (See Comments)    Upset stomach   Prior to Admission medications   Medication Sig Start Date End Date Taking? Authorizing Provider  acetaminophen (TYLENOL) 325 MG tablet Take 2 tablets (650 mg total) by mouth every 6 (six) hours as needed for mild pain (or Fever >/= 101). 06/07/16  [name], MD  amLODipine (NORVASC) 10 MG tablet Take 1 tablet (10 mg total) by mouth daily. 11/13/17   Thurnell Lose, MD  BD VEO INSULIN SYRINGE U/F 31G X 15/64" 0.5 ML MISC USE AS DIRECTED 04/09/18   Charlott Rakes, MD  bisacodyl (DULCOLAX) 10 MG suppository Place 1 suppository (10 mg total) rectally daily as needed for moderate constipation. 06/07/16   [name], MD  blood glucose meter kit and supplies KIT Dispense based on patient and insurance preference. Use up to four times daily as directed. (FOR ICD-9 250.00, 250.01). 02/26/17   Kayleen Memos, DO  Blood Glucose Monitoring Suppl (ACCU-CHEK AVIVA PLUS) w/Device KIT Use as directed TID. E11.9 10/04/17   Argentina Donovan, PA-C  docusate sodium (COLACE)  100 MG capsule Take 1 capsule (100 mg total) by mouth 2 (two) times daily. 06/07/16   [name], MD  escitalopram (LEXAPRO) 20 MG tablet TAKE ONE TABLET BY MOUTH EVERY DAY 04/09/18   Charlott Rakes, MD  ferrous sulfate 325 (65 FE) MG tablet Take 1 tablet (325 mg total) by mouth 2 (two) times daily with a meal. 10/04/17   McClung, Dionne Bucy, PA-C  furosemide (LASIX) 20 MG tablet Take 1 daily for swelling Patient taking differently: Take 20 mg by mouth daily as needed for edema.  04/09/18   Charlott Rakes, MD  gabapentin (NEURONTIN) 300 MG capsule Take 1 capsule (300 mg total) by mouth at bedtime. 11/01/17   Argentina Donovan, PA-C  glucose blood (ACCU-CHEK AVIVA PLUS) test strip Use as instructed 10/04/17   Argentina Donovan, PA-C  insulin aspart (NOVOLOG) 100 UNIT/ML injection Before each meal 3 times a day, 140-199 - 2 units, 200-250 - 4 units, 251-299 - 6 units,  300-349 - 8 units,  350 or above 10 units. Dispense syringes and needles as needed, Ok to switch to PEN if approved. Substitute to any brand approved. DX DM2, Code E11.65 11/13/17   Thurnell Lose, MD  Insulin Glargine (LANTUS SOLOSTAR) 100 UNIT/ML Solostar Pen INJECT 30 UNITS INTO THE SKIN DAILY AT 10 PM. MUST MAKE APPT FOR FURTHER REFILLS 04/12/18   Charlott Rakes, MD  Insulin Pen Needle (TRUEPLUS PEN NEEDLES) 32G X 4 MM MISC Use as directed with lantus 10/01/17   Charlott Rakes, MD  JANUVIA 100 MG tablet TAKE ONE TABLET BY MOUTH EVERY DAY 04/09/18   Charlott Rakes, MD  Lancets (ACCU-CHEK SOFT TOUCH) lancets Use as instructed 10/04/17   Argentina Donovan, PA-C  lisinopril (PRINIVIL,ZESTRIL) 2.5 MG tablet TAKE ONE TABLET BY MOUTH EVERY DAY 04/09/18   Charlott Rakes, MD  lisinopril (PRINIVIL,ZESTRIL) 5 MG tablet Take 1 tablet (5 mg total) by mouth daily. Patient taking differently: Take 2.5 mg by mouth daily.  10/04/17   Argentina Donovan, PA-C  oxyCODONE (OXY IR/ROXICODONE) 5 MG immediate release tablet Take 1 tablet (5 mg total) by mouth  every 6 (six) hours as needed for moderate pain (pain score 4-6). 11/13/17   Thurnell Lose, MD  polyethylene glycol [facility]) packet Take 17 g by mouth daily as needed for mild constipation. 06/07/16   [name], MD  senna-docusate (SENOKOT-S) 8.6-50 MG tablet Take 2 tablets by mouth daily. 06/07/16   [name], MD   Dg Ankle Complete Left  Result Date: 05/08/2018 CLINICAL DATA:  Initial evaluation for acute left foot pain, history of prior amputation, diabetes. EXAM: LEFT ANKLE COMPLETE - 3+ VIEW COMPARISON:  Prior MRI from 05/04/2016. FINDINGS: Patient is status post transmetatarsal  amputation of the left forefoot. Amputated margin well-corticated without acute abnormality. Prominent soft tissue ulceration seen at the posterior and plantar aspect of the heel/hindfoot. Focal osseous erosion of the underlying posterior calcaneus, concerning for associated osteomyelitis. No dissecting soft tissue emphysema. No acute fracture or dislocation. Posterior and plantar calcaneal enthesophytes noted. Diffuse osteopenia. IMPRESSION: 1. Large soft tissue ulceration at the posterior aspect of the left he will/hindfoot. Osseous erosion at the posterior aspect of the underlying calcaneus compatible with associated osteomyelitis. 2. Prior trans metatarsal amputation of the left forefoot. No other acute osseous abnormality. Electronically Signed   By: Jeannine Boga M.D.   On: 05/08/2018 22:24   Dg Knee Right Port  Result Date: 05/08/2018 CLINICAL DATA:  Initial evaluation for acute pain, rule out infection. Prior BKA. EXAM: PORTABLE RIGHT KNEE - 1-2 VIEW COMPARISON:  None available. FINDINGS: Patient status post below-the-knee amputation of the right leg. Mild diffuse soft tissue swelling about the amputation stump without appreciable focal soft tissue ulceration. No dissecting soft tissue emphysema. No radiographic evidence for acute osteomyelitis. No fracture or dislocation. Underlying osteopenia noted.  IMPRESSION: 1. Sequelae of prior right BKA. Mild diffuse soft tissue swelling about the amputation stump without focal soft tissue ulceration. No radiographic evidence for osteomyelitis. 2. No other acute osseous abnormality. Electronically Signed   By: Jeannine Boga M.D.   On: 05/08/2018 22:27   US Abdomen Limited Ruq  Result Date: 05/09/2018 CLINICAL DATA:  58 year old female with elevated total bilirubin. EXAM: ULTRASOUND ABDOMEN LIMITED RIGHT UPPER QUADRANT COMPARISON:  None. FINDINGS: Gallbladder: A 1.5 cm gallstone is identified. There is no evidence of gallbladder wall thickening, pericholecystic fluid or sonographic Murphy sign. Common bile duct: Diameter: 4.6 mm. No evidence of intrahepatic or extrahepatic biliary dilatation. Liver: No focal lesion identified. Within normal limits in parenchymal echogenicity. Portal vein is patent on color Doppler imaging with normal direction of blood flow towards the liver. IMPRESSION: 1. Cholelithiasis without evidence of acute cholecystitis. 2. No evidence of biliary dilatation. 3. Unremarkable liver. Electronically Signed   By: Margarette Canada M.D.   On: 05/09/2018 10:32   - pertinent xrays, CT, MRI studies were reviewed and independently interpreted  Positive ROS: All other systems have been reviewed and were otherwise negative with the exception of those mentioned in the HPI and as above.  Physical Exam: General: Alert, no acute distress Psychiatric: Patient is competent for consent with normal mood and affect Lymphatic: No axillary or cervical lymphadenopathy Cardiovascular: No pedal edema Respiratory: No cyanosis, no use of accessory musculature GI: No organomegaly, abdomen is soft and non-tender    Images:  _0 @  Labs:  Lab Results  Component Value Date   HGBA1C >18.5 (H) 05/09/2018   HGBA1C 12.9 (H) 10/04/2017   HGBA1C 15.4 (H) 02/22/2017   ESRSEDRATE 105 (H) 05/09/2018   ESRSEDRATE 105 (H) 02/22/2017   ESRSEDRATE 114  (H) 06/01/2016   CRP 8.6 (H) 05/09/2018   CRP 7.7 (H) 02/23/2017   CRP 5.3 (H) 06/01/2016   LABURIC 3.8 06/04/2016   LABURIC 4.1 06/01/2016   REPTSTATUS PENDING 05/08/2018   GRAMSTAIN  11/09/2017    ABUNDANT WBC PRESENT,BOTH PMN AND MONONUCLEAR ABUNDANT GRAM POSITIVE COCCI FEW GRAM NEGATIVE RODS    CULT  05/08/2018    NO GROWTH < 24 HOURS Performed at [facility], [address], [place], [place] [address]    [place] 11/09/2017    Lab Results  Component Value Date   ALBUMIN 2.7 (L) 05/08/2018  ALBUMIN 3.7 03/13/2018   ALBUMIN 1.7 (L) 11/10/2017   PREALBUMIN 5.1 (L) 02/23/2017   LABURIC 3.8 06/04/2016   LABURIC 4.1 06/01/2016    Neurologic: Patient does not have protective sensation bilateral lower extremities.   MUSCULOSKELETAL:   Skin: Examination patient has a black large gangrenous eschar over the heel she has swelling of the left lower extremity.  She does not have a palpable pulse she has radiographs which show osteomyelitis of the calcaneus.  She has a well-healed transtibial amputation on the right.  She has severe protein caloric malnutrition.  Assessment: Assessment: Diabetic insensate neuropathy with peripheral vascular disease with severe protein caloric malnutrition with osteomyelitis and ulceration of the left hindfoot.  Plan: Plan: Will need to proceed with a transtibial amputation tomorrow.  Patient is very alert and good recall of our previous experiences together and patient does understand the procedure plan.  If patient's family needs to sign for her surgical consent please have this done tomorrow morning.  Thank you for the consult and the opportunity to see [name], MD [facility] [phone] 5:58 PM
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
## 2018-05-09 NOTE — Progress Notes (Signed)
Pharmacy Antibiotic Note  Gina [name] is a 58 y.o. female admitted on 05/08/2018 with wound infection.  Pharmacy has been consulted for cefepime dosing.  Plan: Cefepime 1g IV Q12H.  Height: 5\' 2"  (157.5 cm) Weight: 131 lb 13.4 oz (59.8 kg) IBW/kg (Calculated) : 50.1  Temp (24hrs), Avg:96.6 F (35.9 C), Min:93.9 F (34.4 C), Max:97.9 F (36.6 C)  Recent Labs  Lab 05/08/18 1306  WBC 12.4*  CREATININE 1.20*  LATICACIDVEN 1.1    Estimated Creatinine Clearance: 40.9 mL/min (A) (by C-G formula based on SCr of 1.2 mg/dL (H)).    Allergies  Allergen Reactions  . Metformin And Related Other (See Comments)    Upset stomach     Thank you for allowing pharmacy to be a part of this patient's care.  Wynona Neat, PharmD, BCPS  05/09/2018 1:56 AM
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
## 2018-05-09 NOTE — Social Work (Signed)
CSW discussed pt consult in morning progression, aware pt may require additional surgical intervention. Bedside RN informed CSW that pt has multiple visitors in room. Due to sensitivity of consult requesting that Bedside RN page CSW when pt alone.  CSW continuing to follow for support with disposition when medically appropriate.  Westley Hummer, MSW, [place] Work [phone]

## 2018-05-09 NOTE — H&P (View-Only) (Signed)
ORTHOPAEDIC CONSULTATION  REQUESTING PHYSICIAN: Edwin Dada, *  Chief Complaint: Gangrene osteomyelitis left heel  HPI: Gina [name] is a 58 y.o. female who presents with severe peripheral vascular disease diabetes hypertension who states that she fell outside and had to crawl back into the house she states that the crawling because the ulcer and infection.  Past Medical History:  Diagnosis Date  . Diabetes mellitus   . Hypertension   . Osteomyelitis of right foot ([place]) 02/22/2017  . Schizophrenia ([place])   . Septic arthritis of interphalangeal joint of toe of left foot ([place]) 06/02/2016  . Stress incontinence 04/26/2017   Past Surgical History:  Procedure Laterality Date  . AMPUTATION Left 06/05/2016   Procedure: LEFT FOOT TRANSMETATARSAL AMPUTATION;  Surgeon: [name], MD;  Location: [facility];  Service: Orthopedics;  Laterality: Left;  . AMPUTATION Right 11/11/2017   Procedure: AMPUTATION BELOW KNEE;  Surgeon: [name], MD;  Location: [facility];  Service: Orthopedics;  Laterality: Right;  . AMPUTATION TOE Right 02/23/2017   Procedure: AMPUTATION RIGHT THIRD TOE;  Surgeon: [name], MD;  Location: [facility];  Service: Orthopedics;  Laterality: Right;  . I&D EXTREMITY Right 11/09/2017   Procedure: Debride Ulcer Right Heel;  Surgeon: [name], MD;  Location: [facility];  Service: Orthopedics;  Laterality: Right;  . TUBAL LIGATION     Social History   Socioeconomic History  . Marital status: Single    Spouse name: Not on file  . Number of children: Not on file  . Years of education: Not on file  . Highest education level: Not on file  Occupational History  . Not on file  Social Needs  . Financial resource strain: Not on file  . Food insecurity:    Worry: Not on file    Inability: Not on file  . Transportation needs:    Medical: Not on file    Non-medical: Not on file  Tobacco Use  . Smoking status: Never Smoker  . Smokeless tobacco: Never Used    Substance and Sexual Activity  . Alcohol use: No    Comment: occasionally  . Drug use: No  . Sexual activity: Not Currently  Lifestyle  . Physical activity:    Days per week: Not on file    Minutes per session: Not on file  . Stress: Not on file  Relationships  . Social connections:    Talks on phone: Not on file    Gets together: Not on file    Attends religious service: Not on file    Active member of club or organization: Not on file    Attends meetings of clubs or organizations: Not on file    Relationship status: Not on file  Other Topics Concern  . Not on file  Social History Narrative  . Not on file   Family History  Problem Relation Age of Onset  . Diabetes type II Father   . CAD Father   . Prostate cancer Father   . Diabetes Mellitus II Brother    - negative except otherwise stated in the family history section Allergies  Allergen Reactions  . Metformin And Related Other (See Comments)    Upset stomach   Prior to Admission medications   Medication Sig Start Date End Date Taking? Authorizing Provider  acetaminophen (TYLENOL) 325 MG tablet Take 2 tablets (650 mg total) by mouth every 6 (six) hours as needed for mild pain (or Fever >/= 101). 06/07/16  Rizwan, Saima, MD  amLODipine (NORVASC) 10 MG tablet Take 1 tablet (10 mg total) by mouth daily. 11/13/17   Singh, Prashant K, MD  BD VEO INSULIN SYRINGE U/F 31G X 15/64" 0.5 ML MISC USE AS DIRECTED 04/09/18   Newlin, Enobong, MD  bisacodyl (DULCOLAX) 10 MG suppository Place 1 suppository (10 mg total) rectally daily as needed for moderate constipation. 06/07/16   Rizwan, Saima, MD  blood glucose meter kit and supplies KIT Dispense based on patient and insurance preference. Use up to four times daily as directed. (FOR ICD-9 250.00, 250.01). 02/26/17   Hall, Carole N, DO  Blood Glucose Monitoring Suppl (ACCU-CHEK AVIVA PLUS) w/Device KIT Use as directed TID. E11.9 10/04/17   McClung, Angela M, PA-C  docusate sodium (COLACE)  100 MG capsule Take 1 capsule (100 mg total) by mouth 2 (two) times daily. 06/07/16   Rizwan, Saima, MD  escitalopram (LEXAPRO) 20 MG tablet TAKE ONE TABLET BY MOUTH EVERY DAY 04/09/18   Newlin, Enobong, MD  ferrous sulfate 325 (65 FE) MG tablet Take 1 tablet (325 mg total) by mouth 2 (two) times daily with a meal. 10/04/17   McClung, Angela M, PA-C  furosemide (LASIX) 20 MG tablet Take 1 daily for swelling Patient taking differently: Take 20 mg by mouth daily as needed for edema.  04/09/18   Newlin, Enobong, MD  gabapentin (NEURONTIN) 300 MG capsule Take 1 capsule (300 mg total) by mouth at bedtime. 11/01/17   McClung, Angela M, PA-C  glucose blood (ACCU-CHEK AVIVA PLUS) test strip Use as instructed 10/04/17   McClung, Angela M, PA-C  insulin aspart (NOVOLOG) 100 UNIT/ML injection Before each meal 3 times a day, 140-199 - 2 units, 200-250 - 4 units, 251-299 - 6 units,  300-349 - 8 units,  350 or above 10 units. Dispense syringes and needles as needed, Ok to switch to PEN if approved. Substitute to any brand approved. DX DM2, Code E11.65 11/13/17   Singh, Prashant K, MD  Insulin Glargine (LANTUS SOLOSTAR) 100 UNIT/ML Solostar Pen INJECT 30 UNITS INTO THE SKIN DAILY AT 10 PM. MUST MAKE APPT FOR FURTHER REFILLS 04/12/18   Newlin, Enobong, MD  Insulin Pen Needle (TRUEPLUS PEN NEEDLES) 32G X 4 MM MISC Use as directed with lantus 10/01/17   Newlin, Enobong, MD  JANUVIA 100 MG tablet TAKE ONE TABLET BY MOUTH EVERY DAY 04/09/18   Newlin, Enobong, MD  Lancets (ACCU-CHEK SOFT TOUCH) lancets Use as instructed 10/04/17   McClung, Angela M, PA-C  lisinopril (PRINIVIL,ZESTRIL) 2.5 MG tablet TAKE ONE TABLET BY MOUTH EVERY DAY 04/09/18   Newlin, Enobong, MD  lisinopril (PRINIVIL,ZESTRIL) 5 MG tablet Take 1 tablet (5 mg total) by mouth daily. Patient taking differently: Take 2.5 mg by mouth daily.  10/04/17   McClung, Angela M, PA-C  oxyCODONE (OXY IR/ROXICODONE) 5 MG immediate release tablet Take 1 tablet (5 mg total) by mouth  every 6 (six) hours as needed for moderate pain (pain score 4-6). 11/13/17   Singh, Prashant K, MD  polyethylene glycol (MIRALAX) packet Take 17 g by mouth daily as needed for mild constipation. 06/07/16   Rizwan, Saima, MD  senna-docusate (SENOKOT-S) 8.6-50 MG tablet Take 2 tablets by mouth daily. 06/07/16   Rizwan, Saima, MD   Dg Ankle Complete Left  Result Date: 05/08/2018 CLINICAL DATA:  Initial evaluation for acute left foot pain, history of prior amputation, diabetes. EXAM: LEFT ANKLE COMPLETE - 3+ VIEW COMPARISON:  Prior MRI from 05/04/2016. FINDINGS: Patient is status post transmetatarsal   amputation of the left forefoot. Amputated margin well-corticated without acute abnormality. Prominent soft tissue ulceration seen at the posterior and plantar aspect of the heel/hindfoot. Focal osseous erosion of the underlying posterior calcaneus, concerning for associated osteomyelitis. No dissecting soft tissue emphysema. No acute fracture or dislocation. Posterior and plantar calcaneal enthesophytes noted. Diffuse osteopenia. IMPRESSION: 1. Large soft tissue ulceration at the posterior aspect of the left he will/hindfoot. Osseous erosion at the posterior aspect of the underlying calcaneus compatible with associated osteomyelitis. 2. Prior trans metatarsal amputation of the left forefoot. No other acute osseous abnormality. Electronically Signed   By: Jeannine Boga M.D.   On: 05/08/2018 22:24   Dg Knee Right Port  Result Date: 05/08/2018 CLINICAL DATA:  Initial evaluation for acute pain, rule out infection. Prior BKA. EXAM: PORTABLE RIGHT KNEE - 1-2 VIEW COMPARISON:  None available. FINDINGS: Patient status post below-the-knee amputation of the right leg. Mild diffuse soft tissue swelling about the amputation stump without appreciable focal soft tissue ulceration. No dissecting soft tissue emphysema. No radiographic evidence for acute osteomyelitis. No fracture or dislocation. Underlying osteopenia noted.  IMPRESSION: 1. Sequelae of prior right BKA. Mild diffuse soft tissue swelling about the amputation stump without focal soft tissue ulceration. No radiographic evidence for osteomyelitis. 2. No other acute osseous abnormality. Electronically Signed   By: Jeannine Boga M.D.   On: 05/08/2018 22:27   US Abdomen Limited Ruq  Result Date: 05/09/2018 CLINICAL DATA:  58 year old female with elevated total bilirubin. EXAM: ULTRASOUND ABDOMEN LIMITED RIGHT UPPER QUADRANT COMPARISON:  None. FINDINGS: Gallbladder: A 1.5 cm gallstone is identified. There is no evidence of gallbladder wall thickening, pericholecystic fluid or sonographic Murphy sign. Common bile duct: Diameter: 4.6 mm. No evidence of intrahepatic or extrahepatic biliary dilatation. Liver: No focal lesion identified. Within normal limits in parenchymal echogenicity. Portal vein is patent on color Doppler imaging with normal direction of blood flow towards the liver. IMPRESSION: 1. Cholelithiasis without evidence of acute cholecystitis. 2. No evidence of biliary dilatation. 3. Unremarkable liver. Electronically Signed   By: Margarette Canada M.D.   On: 05/09/2018 10:32   - pertinent xrays, CT, MRI studies were reviewed and independently interpreted  Positive ROS: All other systems have been reviewed and were otherwise negative with the exception of those mentioned in the HPI and as above.  Physical Exam: General: Alert, no acute distress Psychiatric: Patient is competent for consent with normal mood and affect Lymphatic: No axillary or cervical lymphadenopathy Cardiovascular: No pedal edema Respiratory: No cyanosis, no use of accessory musculature GI: No organomegaly, abdomen is soft and non-tender    Images:  _0 @  Labs:  Lab Results  Component Value Date   HGBA1C >18.5 (H) 05/09/2018   HGBA1C 12.9 (H) 10/04/2017   HGBA1C 15.4 (H) 02/22/2017   ESRSEDRATE 105 (H) 05/09/2018   ESRSEDRATE 105 (H) 02/22/2017   ESRSEDRATE 114  (H) 06/01/2016   CRP 8.6 (H) 05/09/2018   CRP 7.7 (H) 02/23/2017   CRP 5.3 (H) 06/01/2016   LABURIC 3.8 06/04/2016   LABURIC 4.1 06/01/2016   REPTSTATUS PENDING 05/08/2018   GRAMSTAIN  11/09/2017    ABUNDANT WBC PRESENT,BOTH PMN AND MONONUCLEAR ABUNDANT GRAM POSITIVE COCCI FEW GRAM NEGATIVE RODS    CULT  05/08/2018    NO GROWTH < 24 HOURS Performed at [facility], [address], [place], [place] [address]    [place] 11/09/2017    Lab Results  Component Value Date   ALBUMIN 2.7 (L) 05/08/2018  ALBUMIN 3.7 03/13/2018   ALBUMIN 1.7 (L) 11/10/2017   PREALBUMIN 5.1 (L) 02/23/2017   LABURIC 3.8 06/04/2016   LABURIC 4.1 06/01/2016    Neurologic: Patient does not have protective sensation bilateral lower extremities.   MUSCULOSKELETAL:   Skin: Examination patient has a black large gangrenous eschar over the heel she has swelling of the left lower extremity.  She does not have a palpable pulse she has radiographs which show osteomyelitis of the calcaneus.  She has a well-healed transtibial amputation on the right.  She has severe protein caloric malnutrition.  Assessment: Assessment: Diabetic insensate neuropathy with peripheral vascular disease with severe protein caloric malnutrition with osteomyelitis and ulceration of the left hindfoot.  Plan: Plan: Will need to proceed with a transtibial amputation tomorrow.  Patient is very alert and good recall of our previous experiences together and patient does understand the procedure plan.  If patient's family needs to sign for her surgical consent please have this done tomorrow morning.  Thank you for the consult and the opportunity to see [name], MD [facility] [phone] 5:58 PM

## 2018-05-10 ENCOUNTER — Inpatient Hospital Stay (HOSPITAL_COMMUNITY): Payer: Medicaid Other | Admitting: Certified Registered Nurse Anesthetist

## 2018-05-10 ENCOUNTER — Encounter (HOSPITAL_COMMUNITY): Payer: Self-pay | Admitting: Certified Registered Nurse Anesthetist

## 2018-05-10 ENCOUNTER — Encounter (HOSPITAL_COMMUNITY)
Admission: EM | Disposition: A | Discharge status: Skilled Nursing Facility | Payer: Self-pay | Source: Home / Self Care | Attending: Family Medicine

## 2018-05-10 DIAGNOSIS — M86272 Subacute osteomyelitis, left ankle and foot: Secondary | ICD-10-CM

## 2018-05-10 DIAGNOSIS — F333 Major depressive disorder, recurrent, severe with psychotic symptoms: Secondary | ICD-10-CM

## 2018-05-10 HISTORY — PX: AMPUTATION: SHX166

## 2018-05-10 LAB — CBC
HCT: 26.7 % — ABNORMAL LOW (ref 36.0–46.0)
HEMOGLOBIN: 8.3 g/dL — AB (ref 12.0–15.0)
MCH: 29.1 pg (ref 26.0–34.0)
MCHC: 31.1 g/dL (ref 30.0–36.0)
MCV: 93.7 fL (ref 80.0–100.0)
Platelets: 254 10*3/uL (ref 150–400)
RBC: 2.85 MIL/uL — ABNORMAL LOW (ref 3.87–5.11)
RDW: 13.9 % (ref 11.5–15.5)
WBC: 8.9 10*3/uL (ref 4.0–10.5)
nRBC: 0 % (ref 0.0–0.2)

## 2018-05-10 LAB — GLUCOSE, CAPILLARY
Glucose-Capillary: 239 mg/dL — ABNORMAL HIGH (ref 70–99)
Glucose-Capillary: 206 mg/dL — ABNORMAL HIGH (ref 70–99)
Glucose-Capillary: 145 mg/dL — ABNORMAL HIGH (ref 70–99)
Glucose-Capillary: 167 mg/dL — ABNORMAL HIGH (ref 70–99)
Glucose-Capillary: 168 mg/dL — ABNORMAL HIGH (ref 70–99)

## 2018-05-10 LAB — COMPREHENSIVE METABOLIC PANEL
ALT: 15 U/L (ref 0–44)
AST: 16 U/L (ref 15–41)
Albumin: 1.8 g/dL — ABNORMAL LOW (ref 3.5–5.0)
Alkaline Phosphatase: 62 U/L (ref 38–126)
Anion gap: 7 (ref 5–15)
BUN: 10 mg/dL (ref 6–20)
CO2: 21 mmol/L — ABNORMAL LOW (ref 22–32)
Calcium: 7.9 mg/dL — ABNORMAL LOW (ref 8.9–10.3)
Chloride: 105 mmol/L (ref 98–111)
Creatinine, Ser: 0.65 mg/dL (ref 0.44–1.00)
GFR calc Af Amer: >60 mL/min (ref >60)
GFR calc non Af Amer: >60 mL/min (ref >60)
Glucose, Bld: 270 mg/dL — ABNORMAL HIGH (ref 70–99)
Potassium: 4 mmol/L (ref 3.5–5.1)
Sodium: 133 mmol/L — ABNORMAL LOW (ref 135–145)
Total Bilirubin: 0.9 mg/dL (ref 0.3–1.2)
Total Protein: 5.9 g/dL — ABNORMAL LOW (ref 6.5–8.1)

## 2018-05-10 SURGERY — AMPUTATION BELOW KNEE
Anesthesia: General | Laterality: Left

## 2018-05-10 SURGERY — AMPUTATION BELOW KNEE
Anesthesia: Choice | Laterality: Left

## 2018-05-10 MED ORDER — CEFAZOLIN SODIUM-DEXTROSE 2-4 GM/100ML-% IV SOLN
2.0000 g | INTRAVENOUS | Status: AC
  Administered 2018-05-10: 2 g via INTRAVENOUS
  Filled 2018-05-10: qty 100

## 2018-05-10 MED ORDER — GABAPENTIN 300 MG PO CAPS
300.0000 mg | ORAL_CAPSULE | Freq: Every day | ORAL | Status: DC
  Administered 2018-05-10 – 2018-05-20 (×12): 300 mg via ORAL
  Filled 2018-05-10 (×13): qty 1

## 2018-05-10 MED ORDER — OXYCODONE HCL 5 MG/5ML PO SOLN: 5.0000 mg | Freq: Once | ORAL | Status: DC | PRN

## 2018-05-10 MED ORDER — FENTANYL CITRATE (PF) 250 MCG/5ML IJ SOLN
INTRAMUSCULAR | Status: AC
  Filled 2018-05-10: qty 5

## 2018-05-10 MED ORDER — FENTANYL CITRATE (PF) 100 MCG/2ML IJ SOLN: 25.0000 ug | INTRAMUSCULAR | Status: DC | PRN

## 2018-05-10 MED ORDER — PHENYLEPHRINE 40 MCG/ML (10ML) SYRINGE FOR IV PUSH (FOR BLOOD PRESSURE SUPPORT)
PREFILLED_SYRINGE | INTRAVENOUS | Status: DC | PRN
  Administered 2018-05-10: 40 ug via INTRAVENOUS

## 2018-05-10 MED ORDER — MIDAZOLAM HCL 5 MG/5ML IJ SOLN
INTRAMUSCULAR | Status: DC | PRN
  Administered 2018-05-10: 2 mg via INTRAVENOUS

## 2018-05-10 MED ORDER — ESCITALOPRAM OXALATE 10 MG PO TABS
20.0000 mg | ORAL_TABLET | Freq: Every day | ORAL | Status: DC
  Administered 2018-05-10 – 2018-05-21 (×12): 20 mg via ORAL
  Filled 2018-05-10 (×12): qty 2

## 2018-05-10 MED ORDER — CHLORHEXIDINE GLUCONATE 4 % EX LIQD
60.0000 mL | Freq: Once | CUTANEOUS | Status: AC
  Administered 2018-05-10: 4 via TOPICAL
  Filled 2018-05-10: qty 60

## 2018-05-10 MED ORDER — LACTATED RINGERS IV SOLN
INTRAVENOUS | Status: DC
  Administered 2018-05-10 (×2): via INTRAVENOUS

## 2018-05-10 MED ORDER — 0.9 % SODIUM CHLORIDE (POUR BTL) OPTIME
TOPICAL | Status: DC | PRN
  Administered 2018-05-10: 1000 mL

## 2018-05-10 MED ORDER — PROPOFOL 10 MG/ML IV BOLUS
INTRAVENOUS | Status: DC | PRN
  Administered 2018-05-10: 150 mg via INTRAVENOUS

## 2018-05-10 MED ORDER — POLYETHYLENE GLYCOL 3350 17 G PO PACK: 17.0000 g | PACK | Freq: Every day | ORAL | Status: DC | PRN

## 2018-05-10 MED ORDER — SENNOSIDES-DOCUSATE SODIUM 8.6-50 MG PO TABS: 2.0000 | ORAL_TABLET | Freq: Every evening | ORAL | Status: DC | PRN

## 2018-05-10 MED ORDER — EPHEDRINE 5 MG/ML INJ
INTRAVENOUS | Status: AC
  Filled 2018-05-10: qty 10

## 2018-05-10 MED ORDER — ONDANSETRON HCL 4 MG/2ML IJ SOLN
INTRAMUSCULAR | Status: DC | PRN
  Administered 2018-05-10: 4 mg via INTRAVENOUS

## 2018-05-10 MED ORDER — POVIDONE-IODINE 10 % EX SWAB: 2.0000 "application " | Freq: Once | CUTANEOUS | Status: DC

## 2018-05-10 MED ORDER — EPHEDRINE SULFATE-NACL 50-0.9 MG/10ML-% IV SOSY
PREFILLED_SYRINGE | INTRAVENOUS | Status: DC | PRN
  Administered 2018-05-10: 10 mg via INTRAVENOUS

## 2018-05-10 MED ORDER — LINAGLIPTIN 5 MG PO TABS
5.0000 mg | ORAL_TABLET | Freq: Every day | ORAL | Status: DC
  Administered 2018-05-11 – 2018-05-21 (×11): 5 mg via ORAL
  Filled 2018-05-10 (×11): qty 1

## 2018-05-10 MED ORDER — MIDAZOLAM HCL 2 MG/2ML IJ SOLN
INTRAMUSCULAR | Status: AC
  Filled 2018-05-10: qty 2

## 2018-05-10 MED ORDER — FENTANYL CITRATE (PF) 100 MCG/2ML IJ SOLN
INTRAMUSCULAR | Status: DC | PRN
  Administered 2018-05-10 (×2): 50 ug via INTRAVENOUS

## 2018-05-10 MED ORDER — OXYCODONE HCL 5 MG PO TABS: 5.0000 mg | ORAL_TABLET | Freq: Once | ORAL | Status: DC | PRN

## 2018-05-10 MED ORDER — ONDANSETRON HCL 4 MG/2ML IJ SOLN: 4.0000 mg | Freq: Four times a day (QID) | INTRAMUSCULAR | Status: DC | PRN

## 2018-05-10 MED ORDER — PROPOFOL 10 MG/ML IV BOLUS
INTRAVENOUS | Status: AC
  Filled 2018-05-10: qty 20

## 2018-05-10 MED ORDER — LIDOCAINE 2% (20 MG/ML) 5 ML SYRINGE
INTRAMUSCULAR | Status: DC | PRN
  Administered 2018-05-10: 60 mg via INTRAVENOUS

## 2018-05-10 SURGICAL SUPPLY — 38 items
BENZOIN TINCTURE PRP APPL 2/3 (GAUZE/BANDAGES/DRESSINGS) ×12 IMPLANT
BLADE SAW RECIP 87.9 MT (BLADE) ×3 IMPLANT
BLADE SURG 21 STRL SS (BLADE) ×3 IMPLANT
BNDG COHESIVE 6X5 TAN STRL LF (GAUZE/BANDAGES/DRESSINGS) ×6 IMPLANT
BNDG GAUZE ELAST 4 BULKY (GAUZE/BANDAGES/DRESSINGS) ×6 IMPLANT
COVER SURGICAL LIGHT HANDLE (MISCELLANEOUS) ×3 IMPLANT
COVER WAND RF STERILE (DRAPES) ×3 IMPLANT
CUFF TOURNIQUET SINGLE 34IN LL (TOURNIQUET CUFF) ×3 IMPLANT
CUFF TOURNIQUET SINGLE 44IN (TOURNIQUET CUFF) IMPLANT
DRAPE INCISE IOBAN 66X45 STRL (DRAPES) IMPLANT
DRAPE U-SHAPE 47X51 STRL (DRAPES) ×3 IMPLANT
DRESSING PREVENA PLUS CUSTOM (GAUZE/BANDAGES/DRESSINGS) ×1 IMPLANT
DRSG PREVENA PLUS CUSTOM (GAUZE/BANDAGES/DRESSINGS) ×3
DURAPREP 26ML APPLICATOR (WOUND CARE) ×3 IMPLANT
ELECT REM PT RETURN 9FT ADLT (ELECTROSURGICAL) ×3
ELECTRODE REM PT RTRN 9FT ADLT (ELECTROSURGICAL) ×1 IMPLANT
GLOVE BIOGEL PI IND STRL 9 (GLOVE) ×1 IMPLANT
GLOVE BIOGEL PI INDICATOR 9 (GLOVE) ×2
GLOVE SURG ORTHO 9.0 STRL STRW (GLOVE) ×3 IMPLANT
GOWN STRL REUS W/ TWL XL LVL3 (GOWN DISPOSABLE) ×2 IMPLANT
GOWN STRL REUS W/TWL XL LVL3 (GOWN DISPOSABLE) ×4
KIT BASIN OR (CUSTOM PROCEDURE TRAY) ×3 IMPLANT
KIT TURNOVER KIT B (KITS) ×3 IMPLANT
MANIFOLD NEPTUNE II (INSTRUMENTS) ×3 IMPLANT
NS IRRIG 1000ML POUR BTL (IV SOLUTION) ×3 IMPLANT
PACK ORTHO EXTREMITY (CUSTOM PROCEDURE TRAY) ×3 IMPLANT
PAD ARMBOARD 7.5X6 YLW CONV (MISCELLANEOUS) ×3 IMPLANT
SPONGE LAP 18X18 RF (DISPOSABLE) IMPLANT
STAPLER VISISTAT 35W (STAPLE) IMPLANT
STOCKINETTE IMPERVIOUS LG (DRAPES) ×3 IMPLANT
SUT ETHILON 2 0 PSLX (SUTURE) IMPLANT
SUT SILK 2 0 (SUTURE) ×2
SUT SILK 2-0 18XBRD TIE 12 (SUTURE) ×1 IMPLANT
SUT VIC AB 1 CTX 27 (SUTURE) ×6 IMPLANT
TOWEL OR 17X26 10 PK STRL BLUE (TOWEL DISPOSABLE) ×3 IMPLANT
TUBE CONNECTING 12'X1/4 (SUCTIONS) ×1
TUBE CONNECTING 12X1/4 (SUCTIONS) ×2 IMPLANT
YANKAUER SUCT BULB TIP NO VENT (SUCTIONS) ×3 IMPLANT

## 2018-05-10 NOTE — Progress Notes (Signed)
Orthopedic Tech Progress Note Patient Details:  Gina [name] August 18, 1960 [id number]  Patient ID: Gina [name], female   DOB: [birth year], 58 y.o.   MRN: [id number]   Gina [name] 05/10/2018, 4:09 PMCalled [employer] for left stump shrinker and protector

## 2018-05-10 NOTE — Progress Notes (Signed)
Initial Nutrition Assessment  DOCUMENTATION CODES:   Not applicable  INTERVENTION:  Provide Ensure Enlive po BID, each supplement provides 350 kcal and 20 grams of protein.  Encourage adequate PO intake.   NUTRITION DIAGNOSIS:   Increased nutrient needs related to wound healing as evidenced by estimated needs.  GOAL:   Patient will meet greater than or equal to 90% of their needs  MONITOR:   PO intake, Supplement acceptance, Labs, Weight trends, I & O's, Skin, Diet advancement  REASON FOR ASSESSMENT:   Consult Assessment of nutrition requirement/status  ASSESSMENT:   58 y.o. female with history of severe peripheral vascular disease, R BKA, diabetes, hypertension. Presents with gangrene osteomyelitis left heel  PROCEDURE (2/28): Transtibial amputation (Left) Application of Prevena wound VAC  Pt unavailable in OR during time of visit. RD unable to obtain most recent pt nutrition history. Per weight records, pt with a 18% weight loss in 5 months. Meal completion 25% prior to NPO status. Pt currently has Ensure ordered. Continue to provide Ensure to aid in wound healing and adequate nutrition needs once diet advances.   Unable to complete Nutrition-Focused physical exam at this time.   Labs and medications reviewed.   Diet Order:   Diet Order            Diet NPO time specified Except for: Sips with Meds  Diet effective midnight              EDUCATION NEEDS:   Not appropriate for education at this time  Skin:  Skin Assessment: Skin Integrity Issues: Skin Integrity Issues:: Incisions, Wound VAC Wound Vac: L leg Incisions: L leg  Last BM:  Unknown  Height:   Ht Readings from Last 1 Encounters:  05/10/18 5\' 2"  (1.575 m)    Weight:   Wt Readings from Last 1 Encounters:  05/10/18 59.8 kg    Ideal Body Weight:  46.75 kg(adjusted for R BKA)  BMI:  Body mass index is 24.11 kg/m.  Estimated Nutritional Needs:   Kcal:  3536-1443  Protein:  80-90  grams  Fluid:  1.7-2 L/day    Corrin Parker, MS, RD, LDN Pager # [phone] After hours/ weekend pager # [phone]

## 2018-05-10 NOTE — Consult Note (Signed)
Unable to see patient since just out of surgery in PACU. Psychiatry will attempt to see again tomorrow.   Buford Dresser, DO 05/10/18 1:33 PM

## 2018-05-10 NOTE — Interval H&P Note (Signed)
History and Physical Interval Note:  05/10/2018 6:39 AM  Gina [name]  has presented today for surgery, with the diagnosis of Osteomyelitis Left Calcaneous  The various methods of treatment have been discussed with the patient and family. After consideration of risks, benefits and other options for treatment, the patient has consented to  Procedure(s): LEFT BELOW KNEE AMPUTATION (Left) as a surgical intervention .  The patient's history has been reviewed, patient examined, no change in status, stable for surgery.  I have reviewed the patient's chart and labs.  Questions were answered to the patient's satisfaction.     [name]

## 2018-05-10 NOTE — Progress Notes (Signed)
PROGRESS NOTE    Gina [name]  ZHY:[id number] DOB: [birth year] DOA: 05/08/2018 PCP: Alfonse Spruce, FNP      Brief Narrative:  Gina [name] is a 58 y.o. F with DM, schizophrenia, HTN and recurrent foot amputations (and right BKA) who presented from her prosthetic clinic for redness of the foot and pain in setting of fever, chills.     Assessment & Plan:  Sepsis from diabetic foot infection Blood cultures no growth.  BP improved.   -Continue vanc, cefepime for 24 hours -Consult orthopedics, appreciate cares   Diabetic ketoacidosis Diabetes, poorly controlled, insulin dependent, with peripheral neuropathy Glucoses still elevated.  -Continue Lantus  -Increase SSI corrections -Hold home metformin, januvia  Hypertension Bp increased -Restart amlodipine -Hold lisinopril, Lasix  AKI Resolved with fluids  Anemia of chronic disease Stable today.   Other medications -Hold SSRI for now  Vaginal candidiasis Severe. -Start fluconazole PO day 2 of 14 days  Schizophrenia Not currently on disease modifying therapy.  No active psychosis, but she appears disorganized and possibly manic. -Consult Psychiatry for med mgmt and ?mania      MDM and disposition: The below labs and imaging reports reviewed and summarized above.  Medication management as above.  The patient was admitted with DKA and cellulitis of the foot with sepsis.  The right foot was not viable, and so orthopedics were consulted for amputation.  She underwent amputation this morning, and subsequently is stable.  We will optimize blood pressure, renal function, and glucoses.  Work with physical therapy anticipate discharge in 2 to 3 days.        DVT prophylaxis: Heparin Code Status: FULL Family Communication: None present    Consultants:   Psychiatry  Orthopedics  Procedures:   None  Antimicrobials:   Vancomycin 2/26 >>  Cefepime 2.26 >>    Subjective: No new fever, vomiting, chest  pain dyspnea, sputum.  She is doing well postoperatively.    Objective: Vitals:   05/10/18 1449 05/10/18 1500 05/10/18 1501 05/10/18 1515  BP: 110/67 119/70 119/70 119/72  Pulse: 68  66 66  Resp: 10  14 (!) 8  Temp:   98.2 F (36.8 C)   TempSrc:   Oral   SpO2: 98%  100% 100%  Weight:      Height:        Intake/Output Summary (Last 24 hours) at 05/10/2018 1600 Last data filed at 05/10/2018 1506 Gross per 24 hour  Intake 2450 ml  Output 575 ml  Net 1875 ml   Filed Weights   05/08/18 1255 05/09/18 0041 05/10/18 1112  Weight: 68 kg 59.8 kg 59.8 kg    Examination: General appearance:   Adult female, lying in bed, no acute distress.   HEENT: Anicteric, conjunctiva pink, lids and lashes normal. No nasal deformity, discharge, epistaxis.  Moist, edentulous, oropharynx moist, no oral lesions, hearing normal. Skin: Warm and dry.  no jaundice.  No suspicious rashes or lesions. Cardiac: Regular rate and rhythm, no murmurs appreciated, no lower extremity edema.   Respiratory: Normal respiratory rate and rhythm, lungs clear without rales or wheezes Abdomen: Soft, no tenderness palpation, ascites, distention, hepatosplenomegaly MSK: No deformities or effusions.  Right BKA.  Now status post left BKA Neuro: Awake and alert, interactive, somewhat tangential and disorganized in her thought process. Psych: Sensorium intact and responding to questions, attention somewhat off. Affect flat.  Judgment and insight appear impaired.  No visual or auditory hallucinations.    Data Reviewed: I have personally reviewed following  labs and imaging studies:  CBC: Recent Labs  Lab 05/08/18 1306 05/09/18 0408 05/10/18 0506  WBC 12.4* 12.3* 8.9  NEUTROABS 10.0*  --   --   HGB 10.3* 8.1* 8.3*  HCT 33.2* 25.5* 26.7*  MCV 97.4 92.1 93.7  PLT 324 259 811   Basic Metabolic Panel: Recent Labs  Lab 05/08/18 1306 05/09/18 0408 05/09/18 0533 05/10/18 0506  NA 132* 133* 134* 133*  K 4.2 4.5 4.3 4.0  CL  94* 103 105 105  CO2 14* 16* 15* 21*  GLUCOSE 468* 119* 156* 270*  BUN 9 11 11 10   CREATININE 1.20* 0.96 1.00 0.65  CALCIUM 9.3 8.2* 8.0* 7.9*   GFR: Estimated Creatinine Clearance: 61.4 mL/min (by C-G formula based on SCr of 0.65 mg/dL). Liver Function Tests: Recent Labs  Lab 05/08/18 1306 05/10/18 0506  AST 17 16  ALT 19 15  ALKPHOS 104 62  BILITOT 1.3* 0.9  PROT 8.1 5.9*  ALBUMIN 2.7* 1.8*   No results for input(s): LIPASE, AMYLASE in the last 168 hours. No results for input(s): AMMONIA in the last 168 hours. Coagulation Profile: No results for input(s): INR, PROTIME in the last 168 hours. Cardiac Enzymes: No results for input(s): CKTOTAL, CKMB, CKMBINDEX, TROPONINI in the last 168 hours. BNP (last 3 results) No results for input(s): PROBNP in the last 8760 hours. HbA1C: Recent Labs    05/09/18 0408  HGBA1C >18.5*   CBG: Recent Labs  Lab 05/09/18 1634 05/09/18 2137 05/10/18 0807 05/10/18 1112 05/10/18 1318  GLUCAP 273* 164* 239* 206* 145*   Lipid Profile: No results for input(s): CHOL, HDL, LDLCALC, TRIG, CHOLHDL, LDLDIRECT in the last 72 hours. Thyroid Function Tests: No results for input(s): TSH, T4TOTAL, FREET4, T3FREE, THYROIDAB in the last 72 hours. Anemia Panel: No results for input(s): VITAMINB12, FOLATE, FERRITIN, TIBC, IRON, RETICCTPCT in the last 72 hours. Urine analysis:    Component Value Date/Time   COLORURINE YELLOW 05/09/2018 [place] 05/09/2018 0653   LABSPEC 1.018 05/09/2018 0653   PHURINE 5.0 05/09/2018 0653   GLUCOSEU >=500 (A) 05/09/2018 0653   HGBUR MODERATE (A) 05/09/2018 0653   BILIRUBINUR NEGATIVE 05/09/2018 0653   BILIRUBINUR N 04/28/2016 1603   KETONESUR 80 (A) 05/09/2018 0653   PROTEINUR 30 (A) 05/09/2018 0653   UROBILINOGEN 0.2 04/28/2016 1603   UROBILINOGEN 0.2 06/18/2013 0650   NITRITE NEGATIVE 05/09/2018 0653   LEUKOCYTESUR NEGATIVE 05/09/2018 0653   Sepsis  Labs: @LABRCNTIP (procalcitonin:4,lacticacidven:4)  ) Recent Results (from the past 240 hour(s))  Culture, blood (routine x 2)     Status: None (Preliminary result)   Collection Time: 05/08/18  9:48 PM  Result Value Ref Range Status   Specimen Description SITE NOT SPECIFIED  Final   Special Requests   Final    BOTTLES DRAWN AEROBIC AND ANAEROBIC Blood Culture results may not be optimal due to an excessive volume of blood received in culture bottles Performed at [facility], [address] [place],  [address]    Culture NO GROWTH 2 DAYS  Final   Report Status PENDING  Incomplete  Culture, blood (routine x 2)     Status: None (Preliminary result)   Collection Time: 05/08/18 10:11 PM  Result Value Ref Range Status   Specimen Description BLOOD RIGHT FOREARM  Final   Special Requests   Final    BOTTLES DRAWN AEROBIC AND ANAEROBIC Blood Culture results may not be optimal due to an inadequate volume of blood received in culture  bottles Performed at [facility], [place] [address], [place], [place] [address]    Culture NO GROWTH 2 DAYS  Final   Report Status PENDING  Incomplete  MRSA PCR Screening     Status: None   Collection Time: 05/09/18 12:50 AM  Result Value Ref Range Status   MRSA by PCR NEGATIVE NEGATIVE Final    Comment:        The GeneXpert MRSA Assay (FDA approved for NASAL specimens only), is one component of a comprehensive MRSA colonization surveillance program. It is not intended to diagnose MRSA infection nor to guide or monitor treatment for MRSA infections. Performed at [facility], [place] [address] [place], [place] [address]          Radiology Studies: Dg Ankle Complete Left  Result Date: 05/08/2018 CLINICAL DATA:  Initial evaluation for acute left foot pain, history of prior amputation, diabetes. EXAM: LEFT ANKLE COMPLETE - 3+ VIEW COMPARISON:  Prior MRI from 05/04/2016. FINDINGS: Patient is status post transmetatarsal amputation of  the left forefoot. Amputated margin well-corticated without acute abnormality. Prominent soft tissue ulceration seen at the posterior and plantar aspect of the heel/hindfoot. Focal osseous erosion of the underlying posterior calcaneus, concerning for associated osteomyelitis. No dissecting soft tissue emphysema. No acute fracture or dislocation. Posterior and plantar calcaneal enthesophytes noted. Diffuse osteopenia. IMPRESSION: 1. Large soft tissue ulceration at the posterior aspect of the left he will/hindfoot. Osseous erosion at the posterior aspect of the underlying calcaneus compatible with associated osteomyelitis. 2. Prior trans metatarsal amputation of the left forefoot. No other acute osseous abnormality. Electronically Signed   By: Jeannine Boga M.D.   On: 05/08/2018 22:24   Dg Knee Right Port  Result Date: 05/08/2018 CLINICAL DATA:  Initial evaluation for acute pain, rule out infection. Prior BKA. EXAM: PORTABLE RIGHT KNEE - 1-2 VIEW COMPARISON:  None available. FINDINGS: Patient status post below-the-knee amputation of the right leg. Mild diffuse soft tissue swelling about the amputation stump without appreciable focal soft tissue ulceration. No dissecting soft tissue emphysema. No radiographic evidence for acute osteomyelitis. No fracture or dislocation. Underlying osteopenia noted. IMPRESSION: 1. Sequelae of prior right BKA. Mild diffuse soft tissue swelling about the amputation stump without focal soft tissue ulceration. No radiographic evidence for osteomyelitis. 2. No other acute osseous abnormality. Electronically Signed   By: Jeannine Boga M.D.   On: 05/08/2018 22:27   US Abdomen Limited Ruq  Result Date: 05/09/2018 CLINICAL DATA:  58 year old female with elevated total bilirubin. EXAM: ULTRASOUND ABDOMEN LIMITED RIGHT UPPER QUADRANT COMPARISON:  None. FINDINGS: Gallbladder: A 1.5 cm gallstone is identified. There is no evidence of gallbladder wall thickening, pericholecystic  fluid or sonographic Murphy sign. Common bile duct: Diameter: 4.6 mm. No evidence of intrahepatic or extrahepatic biliary dilatation. Liver: No focal lesion identified. Within normal limits in parenchymal echogenicity. Portal vein is patent on color Doppler imaging with normal direction of blood flow towards the liver. IMPRESSION: 1. Cholelithiasis without evidence of acute cholecystitis. 2. No evidence of biliary dilatation. 3. Unremarkable liver. Electronically Signed   By: Margarette Canada M.D.   On: 05/09/2018 10:32        Scheduled Meds: . escitalopram  20 mg Oral Daily  . feeding supplement (ENSURE ENLIVE)  237 mL Oral BID BM  . fluconazole  100 mg Oral Daily  . gabapentin  300 mg Oral QHS  . heparin  5,000 Units Subcutaneous Q8H  . Influenza vac split quadrivalent PF  0.5 mL Intramuscular  Tomorrow-1000  . insulin aspart  0-15 Units Subcutaneous TID WC  . insulin aspart  0-5 Units Subcutaneous QHS  . insulin glargine  15 Units Subcutaneous Q24H  . [START ON 05/11/2018] linagliptin  5 mg Oral Daily  . pneumococcal 23 valent vaccine  0.5 mL Intramuscular Tomorrow-1000   Continuous Infusions: . ceFEPime (MAXIPIME) IV 2 g (05/10/18 1000)  . lactated ringers 10 mL/hr at 05/10/18 1506  . vancomycin 1,000 mg (05/09/18 2313)     LOS: 2 days    Time spent: 25 minutes    Edwin Dada, MD [facility] 05/10/2018, 4:00 PM     Please page through [place]:  [email or web address] Password [email or web address] If 7PM-7AM, please contact night-coverage

## 2018-05-10 NOTE — Anesthesia Preprocedure Evaluation (Signed)
Anesthesia Evaluation  Patient identified by MRN, date of birth, ID band Patient awake    Reviewed: Allergy & Precautions, H&P , NPO status , Patient's Chart, lab work & pertinent test results  Airway Mallampati: II   Neck ROM: full    Dental   Pulmonary neg pulmonary ROS,    breath sounds clear to auscultation       Cardiovascular hypertension,  Rhythm:regular Rate:Normal     Neuro/Psych PSYCHIATRIC DISORDERS Depression Schizophrenia    GI/Hepatic   Endo/Other  diabetes  Renal/GU      Musculoskeletal  (+) Arthritis ,   Abdominal   Peds  Hematology  (+) Blood dyscrasia, anemia ,   Anesthesia Other Findings   Reproductive/Obstetrics                             Anesthesia Physical Anesthesia Plan  ASA: III  Anesthesia Plan: General   Post-op Pain Management:    Induction: Intravenous  PONV Risk Score and Plan: 3 and Ondansetron, Dexamethasone and Treatment may vary due to age or medical condition  Airway Management Planned: LMA  Additional Equipment:   Intra-op Plan:   Post-operative Plan:   Informed Consent: I have reviewed the patients History and Physical, chart, labs and discussed the procedure including the risks, benefits and alternatives for the proposed anesthesia with the patient or authorized representative who has indicated his/her understanding and acceptance.       Plan Discussed with: CRNA, Anesthesiologist and Surgeon  Anesthesia Plan Comments:         Anesthesia Quick Evaluation

## 2018-05-10 NOTE — Plan of Care (Signed)
°  Problem: Clinical Measurements: °Goal: Respiratory complications will improve °Outcome: Progressing °  °Problem: Pain Managment: °Goal: General experience of comfort will improve °Outcome: Progressing °  °Problem: Safety: °Goal: Ability to remain free from injury will improve °Outcome: Progressing °  °

## 2018-05-10 NOTE — Op Note (Signed)
   Date of Surgery: 05/10/2018  INDICATIONS: Gina [name] is a 58 y.o.-year-old female who presents with gangrene and osteomyelitis left heel.  PREOPERATIVE DIAGNOSIS: gangrene and osteomyelitis left heel  POSTOPERATIVE DIAGNOSIS: Same.  PROCEDURE: Transtibial amputation Application of Prevena wound VAC  SURGEON: Sharol Given, M.D.  ANESTHESIA:  general  IV FLUIDS AND URINE: See anesthesia.  ESTIMATED BLOOD LOSS: min mL.  COMPLICATIONS: None.  DESCRIPTION OF PROCEDURE: The patient was brought to the operating room and underwent a general anesthetic. After adequate levels of anesthesia were obtained patient's lower extremity was prepped using DuraPrep draped into a sterile field. A timeout was called. The foot was draped out of the sterile field with impervious stockinette. A transverse incision was made 11 cm distal to the tibial tubercle. This curved proximally and a large posterior flap was created. The tibia was transected 1 cm proximal to the skin incision. The fibula was transected just proximal to the tibial incision. The tibia was beveled anteriorly. A large posterior flap was created. The sciatic nerve was pulled cut and allowed to retract. The vascular bundles were suture ligated with 2-0 silk. The deep and superficial fascial layers were closed using #1 Vicryl. The skin was closed using staples and 2-0 nylon. The wound was covered with a Prevena wound VAC with restor and customizeable. There was a good suction fit. A prosthetic shrinker will be applied. Patient was extubated taken to the PACU in stable condition.   DISCHARGE PLANNING:  Antibiotic duration:24 hours  Weightbearing: NWB left  Pain medication: opoid pathway  Dressing care/ Wound VAC:VAC for 2 weeks  Discharge to: SNF  Follow-up: In the office 1 week post operative.  [name], MD [place] 1:06 PM

## 2018-05-10 NOTE — Anesthesia Procedure Notes (Signed)
Procedure Name: LMA Insertion Date/Time: 05/10/2018 12:34 PM Performed by: Gwyndolyn Saxon, CRNA Pre-anesthesia Checklist: Patient identified, Emergency Drugs available, Suction available and Patient being monitored Patient Re-evaluated:Patient Re-evaluated prior to induction Oxygen Delivery Method: Circle system utilized Preoxygenation: Pre-oxygenation with 100% oxygen Induction Type: IV induction Ventilation: Mask ventilation without difficulty LMA: LMA inserted LMA Size: 4.0 Number of attempts: 1 Placement Confirmation: positive ETCO2 and breath sounds checked- equal and bilateral Tube secured with: Tape Dental Injury: Teeth and Oropharynx as per pre-operative assessment

## 2018-05-10 NOTE — Progress Notes (Signed)
Patient undergoing surgery; no family at the bedside on call to OR. Son Gina [name] called and made aware of patient transfer for surgery. States will come to the hospital and informed of the waiting area location. Phoned daughter Gina [name] as well but voicemail full and unable to leave messages. Left the unit via bed, alert, and conversant. Leveda Anna, BSN, RN

## 2018-05-10 NOTE — Transfer of Care (Signed)
Immediate Anesthesia Transfer of Care Note  Patient: Gina [name]  Procedure(s) Performed: LEFT BELOW KNEE AMPUTATION (Left )  Patient Location: PACU  Anesthesia Type:General  Level of Consciousness: drowsy  Airway & Oxygen Therapy: Patient Spontanous Breathing and Patient connected to face mask oxygen  Post-op Assessment: Report given to RN and Post -op Vital signs reviewed and stable  Post vital signs: Reviewed and stable  Last Vitals:  Vitals Value Taken Time  BP 94/51 05/10/2018  1:20 PM  Temp    Pulse 81 05/10/2018  1:22 PM  Resp 4 05/10/2018  1:22 PM  SpO2 100 % 05/10/2018  1:22 PM  Vitals shown include unvalidated device data.  Last Pain:  Vitals:   05/10/18 1039  TempSrc: Oral  PainSc:       Patients Stated Pain Goal: 0 (54/62/70 3500)  Complications: No apparent anesthesia complications
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
## 2018-05-11 LAB — COMPREHENSIVE METABOLIC PANEL
ALT: 15 U/L (ref 0–44)
AST: 24 U/L (ref 15–41)
Albumin: 1.8 g/dL — ABNORMAL LOW (ref 3.5–5.0)
Alkaline Phosphatase: 70 U/L (ref 38–126)
Anion gap: 10 (ref 5–15)
BUN: 10 mg/dL (ref 6–20)
CO2: 18 mmol/L — ABNORMAL LOW (ref 22–32)
Calcium: 8.2 mg/dL — ABNORMAL LOW (ref 8.9–10.3)
Chloride: 106 mmol/L (ref 98–111)
Creatinine, Ser: 0.47 mg/dL (ref 0.44–1.00)
GFR calc Af Amer: >60 mL/min (ref >60)
GFR calc non Af Amer: >60 mL/min (ref >60)
Glucose, Bld: 203 mg/dL — ABNORMAL HIGH (ref 70–99)
Potassium: 4 mmol/L (ref 3.5–5.1)
Sodium: 134 mmol/L — ABNORMAL LOW (ref 135–145)
Total Bilirubin: 0.4 mg/dL (ref 0.3–1.2)
Total Protein: 6.2 g/dL — ABNORMAL LOW (ref 6.5–8.1)

## 2018-05-11 LAB — CBC
HCT: 23.9 % — ABNORMAL LOW (ref 36.0–46.0)
Hemoglobin: 7.8 g/dL — ABNORMAL LOW (ref 12.0–15.0)
MCH: 30.4 pg (ref 26.0–34.0)
MCHC: 32.6 g/dL (ref 30.0–36.0)
MCV: 93 fL (ref 80.0–100.0)
Platelets: 261 10*3/uL (ref 150–400)
RBC: 2.57 MIL/uL — ABNORMAL LOW (ref 3.87–5.11)
RDW: 14.3 % (ref 11.5–15.5)
WBC: 7.6 10*3/uL (ref 4.0–10.5)
nRBC: 0 % (ref 0.0–0.2)

## 2018-05-11 LAB — GLUCOSE, CAPILLARY
Glucose-Capillary: 125 mg/dL — ABNORMAL HIGH (ref 70–99)
Glucose-Capillary: 84 mg/dL (ref 70–99)
Glucose-Capillary: 109 mg/dL — ABNORMAL HIGH (ref 70–99)
Glucose-Capillary: 242 mg/dL — ABNORMAL HIGH (ref 70–99)

## 2018-05-11 MED ORDER — PERPHENAZINE 2 MG PO TABS
2.0000 mg | ORAL_TABLET | Freq: Two times a day (BID) | ORAL | Status: DC
  Administered 2018-05-11 – 2018-05-21 (×20): 2 mg via ORAL
  Filled 2018-05-11 (×21): qty 1

## 2018-05-11 MED ORDER — INSULIN GLARGINE 100 UNIT/ML ~~LOC~~ SOLN
20.0000 [IU] | Freq: Every day | SUBCUTANEOUS | Status: DC
  Administered 2018-05-11 – 2018-05-13 (×3): 20 [IU] via SUBCUTANEOUS
  Filled 2018-05-11 (×4): qty 0.2
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
## 2018-05-11 NOTE — Evaluation (Signed)
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 PMhx: schizophrenia, Rt BKA, HTN, DM, PVD   Clinical Impression   Pt is a 58 yo female with above dx. Pt PTA: living home alone with family checking in- reports of unreliable caregivers for 3 hours daily. Pt currently reports no pain with movement, LLE with good ROM. Pt performnig lateral scoots recliner with drop arm to bed and back with minA to move pad with pt. Pt with very strong BUEs to perform task. Pt grooming at sink with set-upA and wanting to bathe soon with NT. Pt advised to use call bell for needs. Pt would benefit from continued OT skilled services for ADL, mobility and safety in [place] setting. OT to follow acutely for ADL needs and assisting with transfers- progressing to drop arm commode transfers.    Follow Up Recommendations  Home health OT;Follow surgeon's recommendation for DC plan and follow-up therapies;Supervision/Assistance - 24 hour    Equipment Recommendations  3 in 1 bedside commode;Other (comment)(drop arm 3in1 required)    Recommendations for Other Services       Precautions / Restrictions Precautions Precautions: Fall Restrictions Weight Bearing Restrictions: Yes LLE Weight Bearing: Non weight bearing      Mobility Bed Mobility Overal bed mobility: Needs Assistance Bed Mobility: Rolling Rolling: Min assist(use of bed pad for lateral scoots)            Transfers Overall transfer level: Needs assistance   Transfers: Lateral/Scoot Transfers          Lateral/Scoot Transfers: Min assist(for bed pad to move with pt.) General transfer comment: pt requires cues to attend to task.,    Balance Overall balance assessment: Needs assistance   Sitting balance-Leahy Scale: Fair                                      ADL either performed or assessed with clinical judgement   ADL Overall ADL's : Needs assistance/impaired Eating/Feeding: Set up;Sitting   Grooming: Set up;Sitting   Upper Body Bathing: Set up;Sitting   Lower Body Bathing: Moderate assistance;Sitting/lateral leans   Upper Body Dressing : Set up;Sitting   Lower Body Dressing: Moderate assistance;Sitting/lateral leans   Toilet Transfer: Anterior/posterior;Requires drop arm;BSC   Toileting- Clothing Manipulation and Hygiene: Minimal assistance;Sitting/lateral lean;Sit to/from stand   Tub/ Banker: Total assistance   Functional mobility during ADLs: Supervision/safety;Rolling walker General ADL Comments: Pt performing ADLs at sink with set-upA for UB and some LB ADL. NT in room about to set-up full bath. pt with drop arm BSC in room. pt had been using bed pan.     Vision   Vision Assessment?: Vision impaired- to be further tested in functional context     Perception     Praxis      Pertinent Vitals/Pain Pain Assessment: No/denies pain     Hand Dominance Right   Extremity/Trunk Assessment Upper Extremity Assessment Upper Extremity Assessment: Generalized weakness   Lower Extremity Assessment Lower Extremity Assessment: Generalized weakness;LLE deficits/detail LLE Deficits / Details: good ROM   Cervical / Trunk Assessment Cervical / Trunk Assessment: Normal   Communication Communication Communication: No difficulties   Cognition Arousal/Alertness: Awake/alert Behavior During Therapy: Restless;Anxious;WFL for tasks assessed/performed Overall Cognitive Status: History of cognitive impairments -  at baseline                                     General Comments  Pt education provided on stump desensitization and use of nurse call bell    Exercises Exercises: Other exercises Other Exercises Other Exercises: chair push-ups (supervised)   Shoulder Instructions      Home Living  Family/patient expects to be discharged to:: Private residence Living Arrangements: Alone Available Help at Discharge: Family;Available PRN/intermittently Type of Home: Apartment Home Access: Level entry     Home Layout: One level     Bathroom Shower/Tub: Teacher, early years/pre: Standard Bathroom Accessibility: No   Home Equipment: Environmental consultant - 2 wheels;Bedside commode;Wheelchair - Biomedical scientist Comments: reports home is not wheelchair accessible      Prior Functioning/Environment Level of Independence: Independent with assistive device(s)        Comments: Using w/c often for mobility        OT Problem List: Decreased strength;Decreased activity tolerance;Impaired balance (sitting and/or standing);Decreased safety awareness;Pain;Increased edema      OT Treatment/Interventions: Self-care/ADL training;Therapeutic exercise;Neuromuscular education;Energy conservation;Therapeutic activities;Patient/family education;Balance training    OT Goals(Current goals can be found in the care plan section) Acute Rehab OT Goals Patient Stated Goal: to [facility] to [facility] Goal Formulation: With patient Time For Goal Achievement: 05/24/18 Potential to Achieve Goals: Good ADL Goals Pt Will Perform Grooming: Independently Pt Will Perform Upper Body Bathing: with set-up;Independently Pt Will Perform Lower Body Dressing: with modified independence;sitting/lateral leans Pt Will Transfer to Toilet: with set-up;bedside commode Additional ADL Goal #1: Pt will perform lateral transfers and anterior-posterior transfers with modified independence.  OT Frequency: Min 2X/week   Barriers to D/C: Inaccessible home environment;Decreased caregiver support  per pt, house is not w/c accessible       Co-evaluation              AM-PAC OT "6 Clicks" Daily Activity     Outcome Measure Help from another person eating meals?: None Help from another person taking care of  personal grooming?: None Help from another person toileting, which includes using toliet, bedpan, or urinal?: None Help from another person bathing (including washing, rinsing, drying)?: A Lot Help from another person to put on and taking off regular upper body clothing?: A Little Help from another person to put on and taking off regular lower body clothing?: A Lot 6 Click Score: 19   End of Session Equipment Utilized During Treatment: Gait belt Nurse Communication: Mobility status  Activity Tolerance: Patient tolerated treatment well Patient left: in chair;with call bell/phone within reach;with chair alarm set  OT Visit Diagnosis: Unsteadiness on feet (R26.81);Muscle weakness (generalized) (M62.81)                Time: 7616-0737 OT Time Calculation (min): 56 min Charges:  OT General Charges $OT Visit: 1 Visit OT Evaluation $OT Eval Moderate Complexity: 1 Mod OT Treatments $Self Care/Home Management : 23-37 mins $Neuromuscular Re-education: 8-22 mins  Ebony Hail Harold Hedge) Marsa Aris OTR/L Acute Rehabilitation Services Pager: [phone] Office: [phone]   Fredda Hammed 05/11/2018, 11:28 AM

## 2018-05-11 NOTE — Progress Notes (Signed)
Daughter [name] called for update. Pt and daughter made aware of patient order for transfer but room number unknown yet. Provided the transfer unit desk phone number for future calls. Farhaan Mabee Ladora [name], BSN, RN

## 2018-05-11 NOTE — Progress Notes (Signed)
Subjective: 1 Day Post-Op Procedure(s) (LRB): LEFT BELOW KNEE AMPUTATION (Left) Patient reports pain as mild and moderate.   Patient very concerned about home situation and seems confused this am. ? Some visual hallucinations this am and during the night. Psychiatry consulted.   Objective: Vital signs in last 24 hours: Temp:  [97.2 F (36.2 C)-99.3 F (37.4 C)] 99.3 F (37.4 C) (02/29 0300) Pulse Rate:  [18-88] 88 (02/29 0300) Resp:  [7-18] 17 (02/29 0300) BP: (94-151)/(51-93) 151/93 (02/29 0300) SpO2:  [97 %-100 %] 100 % (02/29 0300) Weight:  [59.8 kg] 59.8 kg (02/28 1112)  Intake/Output from previous day: 02/28 0701 - 02/29 0700 In: 1279.9 [P.O.:200; I.V.:779.9; IV Piggyback:300] Out: 25 [Blood:25] Intake/Output this shift: No intake/output data recorded.  Recent Labs    05/08/18 1306 05/09/18 0408 05/10/18 0506 05/11/18 0447  HGB 10.3* 8.1* 8.3* 7.8*   Recent Labs    05/10/18 0506 05/11/18 0447  WBC 8.9 7.6  RBC 2.85* 2.57*  HCT 26.7* 23.9*  PLT 254 261   Recent Labs    05/10/18 0506 05/11/18 0447  NA 133* 134*  K 4.0 4.0  CL 105 106  CO2 21* 18*  BUN 10 10  CREATININE 0.65 0.47  GLUCOSE 270* 203*  CALCIUM 7.9* 8.2*   No results for input(s): LABPT, INR in the last 72 hours.  VAC dressing in place over the left transtibial amputation site and functioning well. No drainage in VAC canister. Pillow under the left knee removed and counseled patient to work on full extension at the knee.    Assessment/Plan: 1 Day Post-Op Procedure(s) (LRB): LEFT BELOW KNEE AMPUTATION (Left) Up with therapy [facility] for shrinkers, protector and prosthesis once ready.  Continue VAC dressing and plan for Prevena VAC at DC.    Erlinda Hong, PA-C 05/11/2018, 8:39 AM  [place]

## 2018-05-11 NOTE — Progress Notes (Signed)
PROGRESS NOTE    Gina [name]  MHD:[id number] DOB: 08-26-1960 DOA: 05/08/2018 PCP: Alfonse Spruce, FNP      Brief Narrative:  Gina [name] is a 58 y.o. F with DM, schizophrenia, HTN and recurrent foot amputations (and right BKA) who presented from her prosthetic clinic for redness of the foot and pain in setting of fever, chills.     Assessment & Plan:  Sepsis from diabetic foot infection Blood cultures no growth.  BP labile.  Source control achieved. -Discontinue IV antibiotics -Augmentin 875-125 mg for 5 more days -Consult orthopedics, appreciate cares   Diabetic ketoacidosis Diabetes, poorly controlled, insulin dependent, with peripheral neuropathy Fasting sugar up this morning -Continue Lantus, increase dose -Continue SSI corrections -Hold metformin -Continue januvia as formulary alternative  Hypertension BP labile -Continue amlodipine -Restart lisinopril -Hold Lasix  Schizophrenia The patient is disorganized and tangential.  I am unclear if these are chronic negative symptoms of shizophrenia or if she is actively manic.   Not currently on disease modifying therapy.    -Consult psychiatry  AKI Resolved with fluids  Anemia of chronic disease Expected post-op drop   Other medications -Hold SSRI  Vaginal candidiasis Severe. -Continue fluconazole PO day 3 of 14 days       MDM and disposition: The below labs and imaging reports were reviewed and summarized above.  Medication management as above.  The patient was admitted with DKA and cellulitis of the foot with sepsis.  The right foot was nonviable and so orthopedics was consulted for amputation.  She underwent amputation on 2/11, uncomplicated,.  We will continue oral antibiotics, work with physical therapy, and social work for discharge planning.        DVT prophylaxis: Heparin Code Status: FULL Family Communication: None present    Consultants:    Psychiatry  Orthopedics  Procedures:   None  Antimicrobials:   Vancomycin 2/26 >>  Cefepime 2.26 >>    Subjective: No new fever, vomiting, chest pain, dyspnea, sputum.  She perseverates mostly on having stolen from her house.  She is somewhat disorganized and tangential.  No visual or auditory hallucinations.  No confusion.         Objective: Vitals:   05/10/18 1900 05/10/18 2047 05/10/18 2300 05/11/18 0300  BP: (!) 130/93 103/71 (!) 147/93 (!) 151/93  Pulse: 76 81 (!) 18 88  Resp: 12 18 11 17   Temp:  98.3 F (36.8 C) (!) 97.4 F (36.3 C) 99.3 F (37.4 C)  TempSrc:  Oral Oral Oral  SpO2: 100% 100% 100% 100%  Weight:      Height:        Intake/Output Summary (Last 24 hours) at 05/11/2018 1323 Last data filed at 05/11/2018 1028 Gross per 24 hour  Intake 819.9 ml  Output -  Net 819.9 ml   Filed Weights   05/08/18 1255 05/09/18 0041 05/10/18 1112  Weight: 68 kg 59.8 kg 59.8 kg    Examination: General appearance:   Healthy female, sitting in recliner, interactive, no acute distress HEENT: Anicteric, conjunctival pink, lids and lashes normal neck.  No nasal deformity, discharge, or epistaxis.  Lips moist, lipstick haphazardly applied, edentulous, oropharynx moist, no oral lesions, hearing normal. Skin: Warm and dry.  no jaundice.  No suspicious rashes or lesions. Cardiac: Regular rate and rhythm, no murmurs appreciated, no lower extremity edema. Respiratory: Normal respiratory rate and rhythm, lungs clear without rales or wheezes. Abdomen: Soft without tenderness to palpation, ascites, distention, hepatosplenomegaly. MSK: No deformities or effusions.  Bilateral BKA.  Stump appears normal.  Wound VAC in place. Neuro: Awake and alert, interactive, somewhat tangential and disorganized in her thought process. Psych: Sensorium intact and responding to questions, attention somewhat off. Affect flat.  Judgment and insight appear impaired.  No visual or auditory  hallucinations.    Data Reviewed: I have personally reviewed following labs and imaging studies:  CBC: Recent Labs  Lab 05/08/18 1306 05/09/18 0408 05/10/18 0506 05/11/18 0447  WBC 12.4* 12.3* 8.9 7.6  NEUTROABS 10.0*  --   --   --   HGB 10.3* 8.1* 8.3* 7.8*  HCT 33.2* 25.5* 26.7* 23.9*  MCV 97.4 92.1 93.7 93.0  PLT 324 259 254 694   Basic Metabolic Panel: Recent Labs  Lab 05/08/18 1306 05/09/18 0408 05/09/18 0533 05/10/18 0506 05/11/18 0447  NA 132* 133* 134* 133* 134*  K 4.2 4.5 4.3 4.0 4.0  CL 94* 103 105 105 106  CO2 14* 16* 15* 21* 18*  GLUCOSE 468* 119* 156* 270* 203*  BUN 9 11 11 10 10   CREATININE 1.20* 0.96 1.00 0.65 0.47  CALCIUM 9.3 8.2* 8.0* 7.9* 8.2*   GFR: Estimated Creatinine Clearance: 61.4 mL/min (by C-G formula based on SCr of 0.47 mg/dL). Liver Function Tests: Recent Labs  Lab 05/08/18 1306 05/10/18 0506 05/11/18 0447  AST 17 16 24   ALT 19 15 15   ALKPHOS 104 62 70  BILITOT 1.3* 0.9 0.4  PROT 8.1 5.9* 6.2*  ALBUMIN 2.7* 1.8* 1.8*   No results for input(s): LIPASE, AMYLASE in the last 168 hours. No results for input(s): AMMONIA in the last 168 hours. Coagulation Profile: No results for input(s): INR, PROTIME in the last 168 hours. Cardiac Enzymes: No results for input(s): CKTOTAL, CKMB, CKMBINDEX, TROPONINI in the last 168 hours. BNP (last 3 results) No results for input(s): PROBNP in the last 8760 hours. HbA1C: Recent Labs    05/09/18 0408  HGBA1C >18.5*   CBG: Recent Labs  Lab 05/10/18 1318 05/10/18 1626 05/10/18 2121 05/11/18 0817 05/11/18 1202  GLUCAP 145* 167* 168* 125* 84   Lipid Profile: No results for input(s): CHOL, HDL, LDLCALC, TRIG, CHOLHDL, LDLDIRECT in the last 72 hours. Thyroid Function Tests: No results for input(s): TSH, T4TOTAL, FREET4, T3FREE, THYROIDAB in the last 72 hours. Anemia Panel: No results for input(s): VITAMINB12, FOLATE, FERRITIN, TIBC, IRON, RETICCTPCT in the last 72 hours. Urine  analysis:    Component Value Date/Time   COLORURINE YELLOW 05/09/2018 [place] 05/09/2018 0653   LABSPEC 1.018 05/09/2018 0653   PHURINE 5.0 05/09/2018 0653   GLUCOSEU >=500 (A) 05/09/2018 0653   HGBUR MODERATE (A) 05/09/2018 0653   BILIRUBINUR NEGATIVE 05/09/2018 0653   BILIRUBINUR N 04/28/2016 1603   KETONESUR 80 (A) 05/09/2018 0653   PROTEINUR 30 (A) 05/09/2018 0653   UROBILINOGEN 0.2 04/28/2016 1603   UROBILINOGEN 0.2 06/18/2013 0650   NITRITE NEGATIVE 05/09/2018 0653   LEUKOCYTESUR NEGATIVE 05/09/2018 0653   Sepsis Labs: @LABRCNTIP (procalcitonin:4,lacticacidven:4)  ) Recent Results (from the past 240 hour(s))  Culture, blood (routine x 2)     Status: None (Preliminary result)   Collection Time: 05/08/18  9:48 PM  Result Value Ref Range Status   Specimen Description SITE NOT SPECIFIED  Final   Special Requests   Final    BOTTLES DRAWN AEROBIC AND ANAEROBIC Blood Culture results may not be optimal due to an excessive volume of blood received in culture bottles Performed at [facility] [address], [place], [place]  [address]    Culture NO GROWTH 2 DAYS  Final   Report Status PENDING  Incomplete  Culture, blood (routine x 2)     Status: None (Preliminary result)   Collection Time: 05/08/18 10:11 PM  Result Value Ref Range Status   Specimen Description BLOOD RIGHT FOREARM  Final   Special Requests   Final    BOTTLES DRAWN AEROBIC AND ANAEROBIC Blood Culture results may not be optimal due to an inadequate volume of blood received in culture bottles Performed at [facility], [place] [address], [place], [place] [address]    Culture NO GROWTH 2 DAYS  Final   Report Status PENDING  Incomplete  MRSA PCR Screening     Status: None   Collection Time: 05/09/18 12:50 AM  Result Value Ref Range Status   MRSA by PCR NEGATIVE NEGATIVE Final    Comment:        The GeneXpert MRSA Assay (FDA approved for NASAL specimens only), is one component of  a comprehensive MRSA colonization surveillance program. It is not intended to diagnose MRSA infection nor to guide or monitor treatment for MRSA infections. Performed at [facility], [place] [address] [place], [place] [address]          Radiology Studies: No results found.      Scheduled Meds: . escitalopram  20 mg Oral Daily  . feeding supplement (ENSURE ENLIVE)  237 mL Oral BID BM  . fluconazole  100 mg Oral Daily  . gabapentin  300 mg Oral QHS  . heparin  5,000 Units Subcutaneous Q8H  . Influenza vac split quadrivalent PF  0.5 mL Intramuscular Tomorrow-1000  . insulin aspart  0-15 Units Subcutaneous TID WC  . insulin aspart  0-5 Units Subcutaneous QHS  . insulin glargine  20 Units Subcutaneous Daily  . linagliptin  5 mg Oral Daily  . pneumococcal 23 valent vaccine  0.5 mL Intramuscular Tomorrow-1000   Continuous Infusions: . lactated ringers 10 mL/hr at 05/10/18 1506     LOS: 3 days    Time spent: 25 minutes    Edwin Dada, MD [facility] 05/11/2018, 1:23 PM     Please page through [place]:  [email or web address] Password [email or web address] If 7PM-7AM, please contact night-coverage
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
## 2018-05-12 LAB — CBC
HCT: 22.7 % — ABNORMAL LOW (ref 36.0–46.0)
Hemoglobin: 7.2 g/dL — ABNORMAL LOW (ref 12.0–15.0)
MCH: 29.9 pg (ref 26.0–34.0)
MCHC: 31.7 g/dL (ref 30.0–36.0)
MCV: 94.2 fL (ref 80.0–100.0)
Platelets: 259 10*3/uL (ref 150–400)
RBC: 2.41 MIL/uL — ABNORMAL LOW (ref 3.87–5.11)
RDW: 14.3 % (ref 11.5–15.5)
WBC: 6.3 10*3/uL (ref 4.0–10.5)
nRBC: 0 % (ref 0.0–0.2)

## 2018-05-12 LAB — COMPREHENSIVE METABOLIC PANEL
ALT: 10 U/L (ref 0–44)
ANION GAP: 5 (ref 5–15)
AST: 17 U/L (ref 15–41)
Albumin: 1.5 g/dL — ABNORMAL LOW (ref 3.5–5.0)
Alkaline Phosphatase: 62 U/L (ref 38–126)
BUN: 10 mg/dL (ref 6–20)
CO2: 23 mmol/L (ref 22–32)
Calcium: 7.7 mg/dL — ABNORMAL LOW (ref 8.9–10.3)
Chloride: 108 mmol/L (ref 98–111)
Creatinine, Ser: 0.51 mg/dL (ref 0.44–1.00)
GFR calc Af Amer: >60 mL/min (ref >60)
GFR calc non Af Amer: >60 mL/min (ref >60)
Glucose, Bld: 157 mg/dL — ABNORMAL HIGH (ref 70–99)
Potassium: 3.7 mmol/L (ref 3.5–5.1)
Sodium: 136 mmol/L (ref 135–145)
Total Bilirubin: 0.3 mg/dL (ref 0.3–1.2)
Total Protein: 5.6 g/dL — ABNORMAL LOW (ref 6.5–8.1)

## 2018-05-12 LAB — GLUCOSE, CAPILLARY
Glucose-Capillary: 133 mg/dL — ABNORMAL HIGH (ref 70–99)
Glucose-Capillary: 126 mg/dL — ABNORMAL HIGH (ref 70–99)
Glucose-Capillary: 126 mg/dL — ABNORMAL HIGH (ref 70–99)
Glucose-Capillary: 164 mg/dL — ABNORMAL HIGH (ref 70–99)

## 2018-05-12 NOTE — Progress Notes (Signed)
PROGRESS NOTE    Gina [name]  GEX:[id number] DOB: 1960/09/19 DOA: 05/08/2018 PCP: Gina Spruce, FNP      Brief Narrative:  Gina [name] is a 58 y.o. F with DM, schizophrenia, HTN and recurrent foot amputations (and right BKA) who presented from her prosthetic clinic for redness of the foot and pain in setting of fever, chills.     Assessment & Plan:  Sepsis from diabetic foot infection Blood cultures no growth.  BP labile.  Source control achieved.  PT recommend SNF, patient refusing. -Continue Augmentin 875-125 mg for 5 more days -Consult orthopedics, appreciate cares   Diabetic ketoacidosis Diabetes, poorly controlled, insulin dependent, with peripheral neuropathy FS normal -Continue Lantus -Continue SSI -Hold metformin -Continue Januvia as formulary alternative  Hypertension BP normal -Continue amlodipine, lisinopril -Restart Lasix at d/c  Schizophrenia The patient is disorganized and tangential.  I am unclear if these are chronic negative symptoms of shizophrenia or if she is actively manic.   Not currently on disease modifying therapy.    -Consult psychiatry -Continue Trilafon, SSRI -Referral to [facility] at d/c  AKI Resolved with fluids  Anemia of chronic disease Acute blood loss anemia Expected post-op drop, Hgb down to 7.2 today, completely asymptomatic -Transfusion thershold 7 g/dL -Start iron -Trend Hgb   Vaginal candidiasis Severe. -Continue fluconazole PO day 4 of 14 days       MDM and disposition: The below labs and imaging reports were reviewed and summarized above.  Medication management as above.    The patient was admitted with DKA and cellulitis of the foot with sepsis.  The right foot was nonviable and so orthopedics was consulted for amputation.  She underwent amputation on 0/10, uncomplicated.  At present, we are continuing oral antibiotics, pursuing placement.  When we have arranged a safe discharge plan, discharge to  home.         DVT prophylaxis: Heparin Code Status: FULL Family Communication: None present    Consultants:   Psychiatry  Orthopedics  Procedures:   None  Antimicrobials:   Vancomycin 2/26 >>  Cefepime 2.26 >>    Subjective: No fever, chest pain, dyspnea, sputum.  No confusion, but still disorganized.  No vomiting.  Pain in leg okay, tolerable with pain meds.  Discharge in wound vac is low.  No dizziness, weakness, fatigue, dyspnea.  No hematochezia, melena.        Objective: Vitals:   05/11/18 1744 05/11/18 1900 05/11/18 2110 05/12/18 0425  BP: 130/84 108/68 123/68 108/60  Pulse: 91 97 96 86  Resp:   16 14  Temp: 98 F (36.7 C) 98.9 F (37.2 C) 98.3 F (36.8 C) (!) 97.2 F (36.2 C)  TempSrc: Oral Oral Oral Oral  SpO2: 100% 100% 100% 100%  Weight:      Height:        Intake/Output Summary (Last 24 hours) at 05/12/2018 1007 Last data filed at 05/11/2018 1849 Gross per 24 hour  Intake 1290 ml  Output 1 ml  Net 1289 ml   Filed Weights   05/08/18 1255 05/09/18 0041 05/10/18 1112  Weight: 68 kg 59.8 kg 59.8 kg    Examination: General appearance: adulte female, lying in bed, no acute distress, interactive HEENT: Anicteric, conjunctival pink, lids and lashes normal neck.  No nasal defomity, discharge, or epistaxis.  Lips moist, edentulous, oropharynx normal,  Skin: Warm and dry.  no jaundice.  No suspicious rashes or lesions. Cardiac: RRR, no murmurs, no LE edema. Respiratory: respiratory effort normal, no  rales or wheezes. Abdomen: abdomen soft wtihout TTP, no rigidity or rebound. MSK: No deformities or effusions.  Bilateral BKA.  Stump appears normal.  Wound VAC in place. Neuro: awake and alert, moves upper extremities with normal strength and coordiantion, speech fluent.  [name] Psych: oriented to person, place and time.  Normal attention, affect odd.  No visual hallucinations.    Data Reviewed: I have personally reviewed following labs and imaging  studies:  CBC: Recent Labs  Lab 05/08/18 1306 05/09/18 0408 05/10/18 0506 05/11/18 0447 05/12/18 0400  WBC 12.4* 12.3* 8.9 7.6 6.3  NEUTROABS 10.0*  --   --   --   --   HGB 10.3* 8.1* 8.3* 7.8* 7.2*  HCT 33.2* 25.5* 26.7* 23.9* 22.7*  MCV 97.4 92.1 93.7 93.0 94.2  PLT 324 259 254 261 468   Basic Metabolic Panel: Recent Labs  Lab 05/09/18 0408 05/09/18 0533 05/10/18 0506 05/11/18 0447 05/12/18 0400  NA 133* 134* 133* 134* 136  K 4.5 4.3 4.0 4.0 3.7  CL 103 105 105 106 108  CO2 16* 15* 21* 18* 23  GLUCOSE 119* 156* 270* 203* 157*  BUN 11 11 10 10 10   CREATININE 0.96 1.00 0.65 0.47 0.51  CALCIUM 8.2* 8.0* 7.9* 8.2* 7.7*   GFR: Estimated Creatinine Clearance: 61.4 mL/min (by C-G formula based on SCr of 0.51 mg/dL). Liver Function Tests: Recent Labs  Lab 05/08/18 1306 05/10/18 0506 05/11/18 0447 05/12/18 0400  AST 17 16 24 17   ALT 19 15 15 10   ALKPHOS 104 62 70 62  BILITOT 1.3* 0.9 0.4 0.3  PROT 8.1 5.9* 6.2* 5.6*  ALBUMIN 2.7* 1.8* 1.8* 1.5*   No results for input(s): LIPASE, AMYLASE in the last 168 hours. No results for input(s): AMMONIA in the last 168 hours. Coagulation Profile: No results for input(s): INR, PROTIME in the last 168 hours. Cardiac Enzymes: No results for input(s): CKTOTAL, CKMB, CKMBINDEX, TROPONINI in the last 168 hours. BNP (last 3 results) No results for input(s): PROBNP in the last 8760 hours. HbA1C: No results for input(s): HGBA1C in the last 72 hours. CBG: Recent Labs  Lab 05/11/18 0817 05/11/18 1202 05/11/18 1644 05/11/18 2118 05/12/18 0840  GLUCAP 125* 84 109* 242* 133*   Lipid Profile: No results for input(s): CHOL, HDL, LDLCALC, TRIG, CHOLHDL, LDLDIRECT in the last 72 hours. Thyroid Function Tests: No results for input(s): TSH, T4TOTAL, FREET4, T3FREE, THYROIDAB in the last 72 hours. Anemia Panel: No results for input(s): VITAMINB12, FOLATE, FERRITIN, TIBC, IRON, RETICCTPCT in the last 72 hours. Urine analysis:      Component Value Date/Time   COLORURINE YELLOW 05/09/2018 H. Rivera Colon 05/09/2018 0653   LABSPEC 1.018 05/09/2018 0653   PHURINE 5.0 05/09/2018 0653   GLUCOSEU >=500 (A) 05/09/2018 0653   HGBUR MODERATE (A) 05/09/2018 0653   BILIRUBINUR NEGATIVE 05/09/2018 0653   BILIRUBINUR N 04/28/2016 1603   KETONESUR 80 (A) 05/09/2018 0653   PROTEINUR 30 (A) 05/09/2018 0653   UROBILINOGEN 0.2 04/28/2016 1603   UROBILINOGEN 0.2 06/18/2013 0650   NITRITE NEGATIVE 05/09/2018 0653   LEUKOCYTESUR NEGATIVE 05/09/2018 0653   Sepsis Labs: @LABRCNTIP (procalcitonin:4,lacticacidven:4)  ) Recent Results (from the past 240 hour(s))  Culture, blood (routine x 2)     Status: None (Preliminary result)   Collection Time: 05/08/18  9:48 PM  Result Value Ref Range Status   Specimen Description SITE NOT SPECIFIED  Final   Special Requests   Final    BOTTLES DRAWN AEROBIC AND ANAEROBIC  Blood Culture results may not be optimal due to an excessive volume of blood received in culture bottles   Culture   Final    NO GROWTH 3 DAYS Performed at [facility] [address] [place], [place] [address]    Report Status PENDING  Incomplete  Culture, blood (routine x 2)     Status: None (Preliminary result)   Collection Time: 05/08/18 10:11 PM  Result Value Ref Range Status   Specimen Description BLOOD RIGHT FOREARM  Final   Special Requests   Final    BOTTLES DRAWN AEROBIC AND ANAEROBIC Blood Culture results may not be optimal due to an inadequate volume of blood received in culture bottles   Culture   Final    NO GROWTH 3 DAYS Performed at [facility], [place] [address], [place], [place] [address]    Report Status PENDING  Incomplete  MRSA PCR Screening     Status: None   Collection Time: 05/09/18 12:50 AM  Result Value Ref Range Status   MRSA by PCR NEGATIVE NEGATIVE Final    Comment:        The GeneXpert MRSA Assay (FDA approved for NASAL specimens only), is one component of  a comprehensive MRSA colonization surveillance program. It is not intended to diagnose MRSA infection nor to guide or monitor treatment for MRSA infections. Performed at [facility], [place] [address], [place], [place] [address]          Radiology Studies: No results found.      Scheduled Meds: . escitalopram  20 mg Oral Daily  . feeding supplement (ENSURE ENLIVE)  237 mL Oral BID BM  . fluconazole  100 mg Oral Daily  . gabapentin  300 mg Oral QHS  . heparin  5,000 Units Subcutaneous Q8H  . Influenza vac split quadrivalent PF  0.5 mL Intramuscular Tomorrow-1000  . insulin aspart  0-15 Units Subcutaneous TID WC  . insulin aspart  0-5 Units Subcutaneous QHS  . insulin glargine  20 Units Subcutaneous Daily  . linagliptin  5 mg Oral Daily  . perphenazine  2 mg Oral BID  . pneumococcal 23 valent vaccine  0.5 mL Intramuscular Tomorrow-1000   Continuous Infusions: . lactated ringers 10 mL/hr at 05/10/18 1506     LOS: 4 days    Time spent: 25 minutes    Edwin Dada, MD [facility] 05/12/2018, 10:07 AM     Please page through [place]:  [email or web address] Password [email or web address] If 7PM-7AM, please contact night-coverage

## 2018-05-12 NOTE — Care Management Note (Signed)
Case Management Note RN CM weekend coverage for 5N [phone]  Patient Details  Name: Gina [name] MRN: [id number] Date of Birth: 1961/03/12  Subjective/Objective:  Pt admitted with sepsis from DM foot infection s/p amputation on 2/28                Action/Plan: PTA pt lived at home, Per PT eval recommendations for STSNF however pt refusing stating she wants to go home. Asked to see pt per MD for transition of care needs. CM spoke with pt at bedside- per conversation pt states she has been at [facility] in past and did not stay the 30 days required so she owes them money- she states she is not going to any SNF- and "has plenty of people who can help her" however when asked who is going to be at home with her she can not tell this CM who that will be. She does state that she will need assistance with transportation however. She also reports that she had an aide that was coming 80 hrs/week (1030am -130pm daily) but that the aide "has left" and offers no further info regarding this. Asked about needed DME- pt reports she has a w/c but does not know where it is at currently ?maybe was left at the clinic she was at prior to admission- as clinic is not open today - CM will need to call about this first of the week. Pt reports that she has a walker at home but would need a BSC. List provided to pt for [facility] agency of choice Per CMS guidelines from medicare.gov website with star ratings (copy placed in shadow chart)- per pt she has used [facility] in the past and really like them- wants to use them again. - Pt also asking for clothes and other person items and household items. Will ask CSW to see pt and to assess for possible placement. Pt seems to have scattered thoughts, and would need to provide name of person that would be at home to assist to that transition plan could be verified. CM and CSW to f/u for safe transition plan.   Expected Discharge Date:                  Expected Discharge Plan:   Skilled Nursing Facility  In-House Referral:  Clinical Social Work  Discharge planning Services  CM Consult  Post Acute Care Choice:  Durable Medical Equipment, Home Health Choice offered to:  Patient  DME Arranged:    DME Agency:     HH Arranged:  Refused SNF [place] Agency:  [facility]  Status of Service:  In process, will continue to follow  If discussed at Long Length of Stay Meetings, dates discussed:    Discharge Disposition:   Additional Comments:  Dawayne Patricia, RN 05/12/2018, 1:24 PM

## 2018-05-12 NOTE — Progress Notes (Signed)
Pt Hgb was 7.2.  On call provider for[facility] notified

## 2018-05-12 NOTE — Progress Notes (Signed)
Patient ID: Gina [name], female   DOB: [birth year], 58 y.o.   MRN: [id number] Patient in good spirits this morning she is alert and oriented.  Status post left transtibial amputation.  No drainage of the wound VAC canister.  Plan for discharge to rehab.

## 2018-05-12 NOTE — Plan of Care (Signed)

## 2018-05-12 NOTE — Plan of Care (Signed)
  Problem: Pain Managment: Goal: General experience of comfort will improve Outcome: Progressing   Problem: Safety: Goal: Ability to remain free from injury will improve Outcome: Progressing   

## 2018-05-13 ENCOUNTER — Encounter (HOSPITAL_COMMUNITY): Payer: Self-pay | Admitting: Orthopedic Surgery

## 2018-05-13 ENCOUNTER — Inpatient Hospital Stay (HOSPITAL_COMMUNITY): Payer: Medicaid Other

## 2018-05-13 LAB — CULTURE, BLOOD (ROUTINE X 2)
Culture: NO GROWTH
Culture: NO GROWTH

## 2018-05-13 LAB — COMPREHENSIVE METABOLIC PANEL
ALT: 11 U/L (ref 0–44)
AST: 18 U/L (ref 15–41)
Albumin: 1.6 g/dL — ABNORMAL LOW (ref 3.5–5.0)
Alkaline Phosphatase: 67 U/L (ref 38–126)
Anion gap: 8 (ref 5–15)
BUN: 12 mg/dL (ref 6–20)
CO2: 19 mmol/L — ABNORMAL LOW (ref 22–32)
Calcium: 7.7 mg/dL — ABNORMAL LOW (ref 8.9–10.3)
Chloride: 108 mmol/L (ref 98–111)
Creatinine, Ser: 0.53 mg/dL (ref 0.44–1.00)
GFR calc Af Amer: >60 mL/min (ref >60)
GFR calc non Af Amer: >60 mL/min (ref >60)
Glucose, Bld: 205 mg/dL — ABNORMAL HIGH (ref 70–99)
Potassium: 4.2 mmol/L (ref 3.5–5.1)
Sodium: 135 mmol/L (ref 135–145)
Total Bilirubin: 0.4 mg/dL (ref 0.3–1.2)
Total Protein: 5.5 g/dL — ABNORMAL LOW (ref 6.5–8.1)

## 2018-05-13 LAB — CBC
HCT: 22.4 % — ABNORMAL LOW (ref 36.0–46.0)
Hemoglobin: 6.9 g/dL — CL (ref 12.0–15.0)
MCH: 29.4 pg (ref 26.0–34.0)
MCHC: 30.8 g/dL (ref 30.0–36.0)
MCV: 95.3 fL (ref 80.0–100.0)
Platelets: 291 10*3/uL (ref 150–400)
RBC: 2.35 MIL/uL — ABNORMAL LOW (ref 3.87–5.11)
RDW: 14.4 % (ref 11.5–15.5)
WBC: 7.5 10*3/uL (ref 4.0–10.5)
nRBC: 0 % (ref 0.0–0.2)

## 2018-05-13 LAB — GLUCOSE, CAPILLARY
Glucose-Capillary: 173 mg/dL — ABNORMAL HIGH (ref 70–99)
Glucose-Capillary: 303 mg/dL — ABNORMAL HIGH (ref 70–99)
Glucose-Capillary: 146 mg/dL — ABNORMAL HIGH (ref 70–99)
Glucose-Capillary: 189 mg/dL — ABNORMAL HIGH (ref 70–99)

## 2018-05-13 LAB — PREPARE RBC (CROSSMATCH)

## 2018-05-13 MED ORDER — FLUCONAZOLE 150 MG PO TABS
150.0000 mg | ORAL_TABLET | ORAL | Status: AC
  Administered 2018-05-14 – 2018-05-20 (×3): 150 mg via ORAL
  Filled 2018-05-13 (×3): qty 1

## 2018-05-13 MED ORDER — INSULIN GLARGINE 100 UNIT/ML ~~LOC~~ SOLN
24.0000 [IU] | Freq: Every day | SUBCUTANEOUS | Status: DC
  Administered 2018-05-14 – 2018-05-15 (×2): 24 [IU] via SUBCUTANEOUS
  Filled 2018-05-13 (×2): qty 0.24

## 2018-05-13 MED ORDER — SODIUM CHLORIDE 0.9% IV SOLUTION
Freq: Once | INTRAVENOUS | Status: AC
  Administered 2018-05-13: 12:00:00 via INTRAVENOUS

## 2018-05-13 NOTE — Progress Notes (Signed)
Patient transported to ultrasound via bed

## 2018-05-13 NOTE — Progress Notes (Signed)
Night shift nurse reported that several unsuccessful in and out attempts done and the patient having yeast type vaginal drainage.  16 fr foley cath was placed due to the patient feeling uncomfortable from not being able to urinate. Immediate return of 1000 mls of urine noted/ foley was clamped.  After returning, foley unclamped and 700 mls more received. Urine appear yellow and cloudy.

## 2018-05-13 NOTE — Progress Notes (Addendum)
PROGRESS NOTE    Gina [name]  ZJI:[id number] DOB: [birth year] DOA: 05/08/2018 PCP: Alfonse Spruce, FNP      Brief Narrative:  Gina [name] is a 58 y.o. F with DM, schizophrenia, HTN and recurrent foot amputations (and right BKA) who presented from her prosthetic clinic for redness of the foot and pain in setting of fever, chills.       Assessment & Plan:  Sepsis from diabetic foot infection Blood cultures no growth.    Source control achieved.    -Continue Augmentin 875-125 mg for 4 more days -Consult orthopedics, appreciate cares   Diabetic ketoacidosis Diabetes, poorly controlled, insulin dependent, with peripheral neuropathy Fasting sugar elevated -Continue Lantus, increase dose -Continue sliding scale corrections -Hold metformin -Continue Januvia as formulary alternative  Hypertension Blood pressure normal -Continue amlodipine, lisinopril -Restart Lasix at d/c  Schizophrenia The patient is disorganized and tangential.  Evaluated by psychiatry, she has mild auditory hallucinations, no active mania. Not currently on disease modifying therapy.    -Consult psychiatry -Continue perphenazine/Trilafon, SSRI -Referral to [facility] at d/c  AKI Resolved.  Anemia of chronic disease Acute postoperative blood loss anemia Expected post-op drop, Hgb down to 6.9 today -Transfusion thershold 7 g/dL -Transfuse 1 unit PRBCs -Continue iron -Trend Hgb   Vaginal candidiasis Severe. -Continue fluconazole transition to 150 q72hrs for 3 more doses  Decision-making capacity The patient's interactions do not consistently demonstrate ordered, logical thinking and her decision-making capacity is questioned. In my evaluation, the patient: --was not able to articulate her medical problem (recent amputation, need to adapt to bilateral amputation, need for skilled wound care, and deficits in mobility and ability to complete ADLs) and the proposed treatment plan (rehabilitation in  skilled nursing facility vs home with 24 hour supervision and home health).  --was not able to articulate alternative treatment plans nor the option to refuse proposed options (her attempts to articulate a discharge plan were completely disorganized and incomprehensible, she was unable to name a single person who would be present with her at home or provide support, and she was unable to list any activities of daily living such as dressing, toileting, feeding, that would require assistance during her rehab) --was not able to appreciate reasonably forseeable consequences of the proposed treatment plan or of refusing the proposed treatment plan  In the setting of her mild psychosis, for which we are initiating Trilafon, I am requesting opinion from psychiatry re: her ability to make decisions in setting of psychosis.  In the meantime, I do not believe he has medical decision-making capacity to refuse SNF or determine her post-hospital discharge plans.              MDM and disposition: The below labs and imaging reports were reviewed and summarized above.  Medication management as above.  The patient was admitted with DKA and cellulitis of the foot with sepsis.  Her right foot was nonviable and so orthopedic was consulted for amputation.  She underwent amputation on 1/75 8, uncomplicated.  At present we will continue oral antibiotics, patient platelet.  When we arrange a safe discharge home she can be discharged.         DVT prophylaxis: Heparin Code Status: FULL Family Communication: None present    Consultants:   Psychiatry  Orthopedics  Procedures:   None  Antimicrobials:   Vancomycin 2/26 >>  Cefepime 2.26 >>    Subjective: No new fever, chest pain, dyspnea, sputum.  Overnight she had urinary retention, and so catheter was  placed this morning with 1.7 L return.  Pain in leg tolerable with pain medicines.  Discharge in the wound VAC is as expected.  No  hematochezia, melena, epistaxis.        Objective: Vitals:   05/13/18 0451 05/13/18 1021 05/13/18 1154 05/13/18 1211  BP: (!) 166/75 (!) 148/88 132/61 126/64  Pulse: 91 90 93 94  Resp: 14 18 18 18   Temp: 98.4 F (36.9 C) 98.3 F (36.8 C) 99.1 F (37.3 C) 99.4 F (37.4 C)  TempSrc: Oral Oral Oral Oral  SpO2: 100% 100% 97% 100%  Weight:      Height:        Intake/Output Summary (Last 24 hours) at 05/13/2018 1308 Last data filed at 05/13/2018 0900 Gross per 24 hour  Intake 240 ml  Output 0 ml  Net 240 ml   Filed Weights   05/08/18 1255 05/09/18 0041 05/10/18 1112  Weight: 68 kg 59.8 kg 59.8 kg    Examination: General appearance: Adult female, sitting in bed, no acute distress, interactive HEENT: Anicteric, conjunctival pink, lids and lashes normal.  No nasal deformity, discharge, epistaxis.  Lips moist, edentulous.  Oropharynx moist, no oral lesions. Skin: Warm and dry.  no jaundice.  No suspicious rashes or lesions. Cardiac: Regular rate and rhythm, no murmurs, no lower extremity edema. Respiratory: Respiratory effort normal, no rales or wheezes. Abdomen: Soft without tenderness to palpation, rigidity, or rebound. MSK: No deformities or effusions.  Bilateral BKA.  Stump appears normal.  Wound VAC in place, no change. Neuro: Awake and alert, moves upper extremities with normal strength coordination, speech fluent. Psych: Oriented to person, place, and time.  Normal attention, affect odd, somewhat difficult to redirect, no visual hallucinations.    Data Reviewed: I have personally reviewed following labs and imaging studies:  CBC: Recent Labs  Lab 05/08/18 1306 05/09/18 0408 05/10/18 0506 05/11/18 0447 05/12/18 0400 05/13/18 0330  WBC 12.4* 12.3* 8.9 7.6 6.3 7.5  NEUTROABS 10.0*  --   --   --   --   --   HGB 10.3* 8.1* 8.3* 7.8* 7.2* 6.9*  HCT 33.2* 25.5* 26.7* 23.9* 22.7* 22.4*  MCV 97.4 92.1 93.7 93.0 94.2 95.3  PLT 324 259 254 261 259 919   Basic Metabolic  Panel: Recent Labs  Lab 05/09/18 0533 05/10/18 0506 05/11/18 0447 05/12/18 0400 05/13/18 0330  NA 134* 133* 134* 136 135  K 4.3 4.0 4.0 3.7 4.2  CL 105 105 106 108 108  CO2 15* 21* 18* 23 19*  GLUCOSE 156* 270* 203* 157* 205*  BUN 11 10 10 10 12   CREATININE 1.00 0.65 0.47 0.51 0.53  CALCIUM 8.0* 7.9* 8.2* 7.7* 7.7*   GFR: Estimated Creatinine Clearance: 61.4 mL/min (by C-G formula based on SCr of 0.53 mg/dL). Liver Function Tests: Recent Labs  Lab 05/08/18 1306 05/10/18 0506 05/11/18 0447 05/12/18 0400 05/13/18 0330  AST 17 16 24 17 18   ALT 19 15 15 10 11   ALKPHOS 104 62 70 62 67  BILITOT 1.3* 0.9 0.4 0.3 0.4  PROT 8.1 5.9* 6.2* 5.6* 5.5*  ALBUMIN 2.7* 1.8* 1.8* 1.5* 1.6*   No results for input(s): LIPASE, AMYLASE in the last 168 hours. No results for input(s): AMMONIA in the last 168 hours. Coagulation Profile: No results for input(s): INR, PROTIME in the last 168 hours. Cardiac Enzymes: No results for input(s): CKTOTAL, CKMB, CKMBINDEX, TROPONINI in the last 168 hours. BNP (last 3 results) No results for input(s): PROBNP in the last  8760 hours. HbA1C: No results for input(s): HGBA1C in the last 72 hours. CBG: Recent Labs  Lab 05/12/18 1309 05/12/18 1702 05/12/18 2151 05/13/18 0824 05/13/18 1146  GLUCAP 126* 126* 164* 173* 303*   Lipid Profile: No results for input(s): CHOL, HDL, LDLCALC, TRIG, CHOLHDL, LDLDIRECT in the last 72 hours. Thyroid Function Tests: No results for input(s): TSH, T4TOTAL, FREET4, T3FREE, THYROIDAB in the last 72 hours. Anemia Panel: No results for input(s): VITAMINB12, FOLATE, FERRITIN, TIBC, IRON, RETICCTPCT in the last 72 hours. Urine analysis:    Component Value Date/Time   COLORURINE YELLOW 05/09/2018 [place] 05/09/2018 0653   LABSPEC 1.018 05/09/2018 0653   PHURINE 5.0 05/09/2018 0653   GLUCOSEU >=500 (A) 05/09/2018 0653   HGBUR MODERATE (A) 05/09/2018 0653   BILIRUBINUR NEGATIVE 05/09/2018 0653    BILIRUBINUR N 04/28/2016 1603   KETONESUR 80 (A) 05/09/2018 0653   PROTEINUR 30 (A) 05/09/2018 0653   UROBILINOGEN 0.2 04/28/2016 1603   UROBILINOGEN 0.2 06/18/2013 0650   NITRITE NEGATIVE 05/09/2018 0653   LEUKOCYTESUR NEGATIVE 05/09/2018 0653   Sepsis Labs: @LABRCNTIP (procalcitonin:4,lacticacidven:4)  ) Recent Results (from the past 240 hour(s))  Culture, blood (routine x 2)     Status: None   Collection Time: 05/08/18  9:48 PM  Result Value Ref Range Status   Specimen Description SITE NOT SPECIFIED  Final   Special Requests   Final    BOTTLES DRAWN AEROBIC AND ANAEROBIC Blood Culture results may not be optimal due to an excessive volume of blood received in culture bottles Performed at [facility] [address] [place], [place] [address]    Culture NO GROWTH 5 DAYS  Final   Report Status 05/13/2018 FINAL  Final  Culture, blood (routine x 2)     Status: None   Collection Time: 05/08/18 10:11 PM  Result Value Ref Range Status   Specimen Description BLOOD RIGHT FOREARM  Final   Special Requests   Final    BOTTLES DRAWN AEROBIC AND ANAEROBIC Blood Culture results may not be optimal due to an inadequate volume of blood received in culture bottles Performed at [facility], [place] [address] [place], [place] [address]    Culture NO GROWTH 5 DAYS  Final   Report Status 05/13/2018 FINAL  Final  MRSA PCR Screening     Status: None   Collection Time: 05/09/18 12:50 AM  Result Value Ref Range Status   MRSA by PCR NEGATIVE NEGATIVE Final    Comment:        The GeneXpert MRSA Assay (FDA approved for NASAL specimens only), is one component of a comprehensive MRSA colonization surveillance program. It is not intended to diagnose MRSA infection nor to guide or monitor treatment for MRSA infections. Performed at [facility], [place] [address] [place], [place] [address]          Radiology Studies: US Renal  Result Date: 05/13/2018 CLINICAL DATA:  Urinary  retention. EXAM: RENAL / URINARY TRACT ULTRASOUND COMPLETE COMPARISON:  Abdominal ultrasound dated June 18, 2013. CT abdomen pelvis dated June 18, 2013. FINDINGS: Right Kidney: Renal measurements: 12.6 x 5.0 x 5.9 cm = volume: 195 mL . Echogenicity within normal limits. No mass or hydronephrosis visualized. Left Kidney: Renal measurements: 10.2 x 5.8 x 5.8 cm = volume: 181 mL. Limited visualization due to overlying bowel gas. Echogenicity within normal limits. No mass or hydronephrosis visualized. Bladder: Appears normal for degree of bladder distention. IMPRESSION: 1. No acute  findings. Electronically Signed   By: Titus Dubin M.D.   On: 05/13/2018 11:10        Scheduled Meds: . sodium chloride   Intravenous Once  . escitalopram  20 mg Oral Daily  . feeding supplement (ENSURE ENLIVE)  237 mL Oral BID BM  . fluconazole  100 mg Oral Daily  . gabapentin  300 mg Oral QHS  . heparin  5,000 Units Subcutaneous Q8H  . insulin aspart  0-15 Units Subcutaneous TID WC  . insulin aspart  0-5 Units Subcutaneous QHS  . insulin glargine  20 Units Subcutaneous Daily  . linagliptin  5 mg Oral Daily  . perphenazine  2 mg Oral BID   Continuous Infusions: . lactated ringers 10 mL/hr at 05/10/18 1506     LOS: 5 days    Time spent: 25 minutes    Edwin Dada, MD [facility] 05/13/2018, 1:08 PM     Please page through [place]:  [email or web address] Password [email or web address] If 7PM-7AM, please contact night-coverage

## 2018-05-13 NOTE — Progress Notes (Signed)
Physical Therapy Treatment Patient Details Name: Gina [name] MRN: [id number] DOB: 07/03/1960 Today's Date: 05/13/2018    History of Present Illness 58 yo admittted with DKA and sepsis with osteo of Left foot s/p BKA 2/28. PMhx: schizophrenia, Rt BKA, HTN, DM, PVD    PT Comments    Patient seen for mobility progression. Pt requires assistance for safety with mobility. Pt is tangential throughout session and appears to lack insight into needs upon d/c. Continue to recommend SNF for further skilled PT services to maximize independence and safety with mobility.      Follow Up Recommendations  SNF;Supervision/Assistance - 24 hour     Equipment Recommendations  Wheelchair (measurements PT)(pt reports they took her chair at admission)    Recommendations for Other Services       Precautions / Restrictions Precautions Precautions: Fall Precaution Comments: bil bKA, no pillow under left knee Restrictions Weight Bearing Restrictions: Yes LLE Weight Bearing: Non weight bearing    Mobility  Bed Mobility Overal bed mobility: Modified Independent             General bed mobility comments: pt is able to get into long sitting in preparation for AP transfer OOB with increased time   Transfers Overall transfer level: Needs assistance   Transfers: Comptroller transfers: Supervision   General transfer comment: supervision for safety; no physical assist required just chair set up and positioning EOB  Ambulation/Gait             General Gait Details: unable   Stairs             Wheelchair Mobility    Modified Rankin (Stroke Patients Only)       Balance Overall balance assessment: Needs assistance   Sitting balance-Leahy Scale: Good                                      Cognition Arousal/Alertness: Awake/alert Behavior During Therapy: WFL for tasks assessed/performed Overall Cognitive Status: No  family/caregiver present to determine baseline cognitive functioning Area of Impairment: Memory;Problem solving;Following commands;Safety/judgement                     Memory: Decreased short-term memory Following Commands: Follows one step commands consistently     Problem Solving: Slow processing        Exercises Amputee Exercises Hip ABduction/ADduction: AROM;10 reps;Left Straight Leg Raises: AROM;10 reps;Left Other Exercises Other Exercises: chair push-ups (supervised)    General Comments General comments (skin integrity, edema, etc.): discussed positioning/precautions for L residual limb      Pertinent Vitals/Pain Pain Assessment: Faces Faces Pain Scale: No hurt Pain Intervention(s): Monitored during session    Home Living                      Prior Function            PT Goals (current goals can now be found in the care plan section) Progress towards PT goals: Progressing toward goals    Frequency    Min 3X/week      PT Plan Current plan remains appropriate    Co-evaluation              AM-PAC PT "6 Clicks" Mobility   Outcome Measure  Help needed turning from your back to your side while in a flat bed without using bedrails?:  A Little Help needed moving from lying on your back to sitting on the side of a flat bed without using bedrails?: None Help needed moving to and from a bed to a chair (including a wheelchair)?: A Little Help needed standing up from a chair using your arms (e.g., wheelchair or bedside chair)?: Total Help needed to walk in hospital room?: Total Help needed climbing 3-5 steps with a railing? : Total 6 Click Score: 13    End of Session   Activity Tolerance: Patient tolerated treatment well Patient left: in chair;with call bell/phone within reach;with chair alarm set Nurse Communication: Mobility status;Precautions PT Visit Diagnosis: Other abnormalities of gait and mobility (R26.89)     Time:  1342-1410 PT Time Calculation (min) (ACUTE ONLY): 28 min  Charges:  $Therapeutic Exercise: 8-22 mins $Therapeutic Activity: 8-22 mins                     Earney Navy, PTA Acute Rehabilitation Services Pager: [phone] Office: [phone]     Darliss Cheney 05/13/2018, 3:43 PM

## 2018-05-13 NOTE — Progress Notes (Signed)
Dr Loleta Books was made aware of foley placement and reason.  Foley has been discontinued as ordered.
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
## 2018-05-13 NOTE — Progress Notes (Signed)
Patient's Hemoglobin is 6.9 [facility] provider notified

## 2018-05-13 NOTE — Progress Notes (Signed)
CSW consulted with patient daughter [name] via phone call regarding patient's discharge plan. Daughter agrees patient is in need of Skilled Nursing short term rehab. She reports patient had a CNA coming to her home that did not report patient's decline in health. Daughter is very concerned regarding patient's mental state and reports patient is not able to logically think about her recovery and discharge plan from the hospital. 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 She inquired regarding obtaining a hospital bed, and reports discharge plan to SNF would provide her and patient's son time to make home setting more equipped for patient needs.   CSW explained medicaid guidelines of patient needing to sign over check to SNF and stay at SNF for 30 days, patient's daughter in agreement with plan.   CSW will complete full workup for patient with plan to dc to SNF. Possible barriers to SNF acceptance include patient's psychological issues.   [place], [place]

## 2018-05-13 NOTE — Progress Notes (Signed)
Bladder scan volume was 507 and in & out cath performed x 3 with no result. Pt has copious amount of yeast flowing from the vagina. Reported to oncoming RN and TRH notified.

## 2018-05-13 NOTE — Plan of Care (Signed)
  Problem: Pain Managment: Goal: General experience of comfort will improve Outcome: Progressing   Problem: Safety: Goal: Ability to remain free from injury will improve Outcome: Progressing   

## 2018-05-14 DIAGNOSIS — F329 Major depressive disorder, single episode, unspecified: Secondary | ICD-10-CM

## 2018-05-14 DIAGNOSIS — Z008 Encounter for other general examination: Secondary | ICD-10-CM

## 2018-05-14 LAB — COMPREHENSIVE METABOLIC PANEL
ALT: 12 U/L (ref 0–44)
AST: 18 U/L (ref 15–41)
Albumin: 1.7 g/dL — ABNORMAL LOW (ref 3.5–5.0)
Alkaline Phosphatase: 86 U/L (ref 38–126)
Anion gap: 7 (ref 5–15)
BUN: 16 mg/dL (ref 6–20)
CO2: 22 mmol/L (ref 22–32)
Calcium: 8.1 mg/dL — ABNORMAL LOW (ref 8.9–10.3)
Chloride: 103 mmol/L (ref 98–111)
Creatinine, Ser: 0.52 mg/dL (ref 0.44–1.00)
GFR calc Af Amer: >60 mL/min (ref >60)
GFR calc non Af Amer: >60 mL/min (ref >60)
Glucose, Bld: 243 mg/dL — ABNORMAL HIGH (ref 70–99)
Potassium: 4.4 mmol/L (ref 3.5–5.1)
Sodium: 132 mmol/L — ABNORMAL LOW (ref 135–145)
Total Bilirubin: 0.2 mg/dL — ABNORMAL LOW (ref 0.3–1.2)
Total Protein: 6.4 g/dL — ABNORMAL LOW (ref 6.5–8.1)

## 2018-05-14 LAB — CBC
HCT: 27.3 % — ABNORMAL LOW (ref 36.0–46.0)
Hemoglobin: 8.6 g/dL — ABNORMAL LOW (ref 12.0–15.0)
MCH: 29.1 pg (ref 26.0–34.0)
MCHC: 31.5 g/dL (ref 30.0–36.0)
MCV: 92.2 fL (ref 80.0–100.0)
Platelets: 350 10*3/uL (ref 150–400)
RBC: 2.96 MIL/uL — ABNORMAL LOW (ref 3.87–5.11)
RDW: 16 % — ABNORMAL HIGH (ref 11.5–15.5)
WBC: 12.1 10*3/uL — ABNORMAL HIGH (ref 4.0–10.5)
nRBC: 0 % (ref 0.0–0.2)

## 2018-05-14 LAB — BPAM RBC
Blood Product Expiration Date: 202003232359
ISSUE DATE / TIME: 202003021151
Unit Type and Rh: 5100

## 2018-05-14 LAB — TYPE AND SCREEN
ABO/RH(D): O POS
Antibody Screen: NEGATIVE
Unit division: 0

## 2018-05-14 LAB — GLUCOSE, CAPILLARY
Glucose-Capillary: 176 mg/dL — ABNORMAL HIGH (ref 70–99)
Glucose-Capillary: 268 mg/dL — ABNORMAL HIGH (ref 70–99)
Glucose-Capillary: 130 mg/dL — ABNORMAL HIGH (ref 70–99)
Glucose-Capillary: 194 mg/dL — ABNORMAL HIGH (ref 70–99)

## 2018-05-14 MED ORDER — WHITE PETROLATUM EX OINT
TOPICAL_OINTMENT | CUTANEOUS | Status: AC
  Administered 2018-05-14: 0.2
  Filled 2018-05-14: qty 28.35

## 2018-05-14 NOTE — Progress Notes (Addendum)
CSW met with patient who reports she is accepting of going to [facility] as it is closed to hospital.   [facility] will visit patient to ensure they can accept her.   Pending this visit and PASRR number.   Update: [facility] has visited patient and has patient's chart under review. They will notify CSW of acceptance or denial either tomorrow or Thursday.   CSW will continue to follow.   [place], [place]

## 2018-05-14 NOTE — Plan of Care (Signed)

## 2018-05-14 NOTE — Progress Notes (Signed)
PROGRESS NOTE    Gina [name]  BWL:[id number] DOB: [birth year] DOA: 05/08/2018 PCP: Alfonse Spruce, FNP      Brief Narrative:  Gina [name] is a 58 y.o. F with DM, schizophrenia, HTN and recurrent foot amputations (and right BKA) who presented from her prosthetic clinic for redness of the foot and pain in setting of fever, chills.       Assessment & Plan:  Sepsis from diabetic foot infection Blood cultures no growth.    Source control achieved.    -Continue Augmentin 875-125 mg for 3 more days -Consult orthopedics, appreciate cares   Diabetic ketoacidosis Diabetes, poorly controlled, insulin dependent, with peripheral neuropathy Glucoses better -Continue Lantus -Continue SSI insulin -Hold metformin -Continue Januvia as formulary alternative  Hypertension BP elevated -Continue amlodipine, lisinopril -Restart Lasix at d/c  Schizophrenia The patient is disorganized and tangential but not actively hallucinating nor agitated.  She was evaluated by psychiatry, she has mild auditory hallucinations which are chronic, no active mania or hallucinations.   Not currently on disease modifying therapy.    -Consult psychiatry -Continue new perphenazine/Trilafon -Continue SSRI -Will need referral to [facility] at d/c from SNF  AKI Resolved.  Anemia of chronic disease Acute postoperative blood loss anemia Appropriate rise in Hgb post-trasnfusion yesterday.  Transfused 1 unit this hospitalization -Transfusion thershold 7 g/dL -Continue iron   Vaginal candidiasis Severe. -Continue fluconazole transition to 150 q72hrs for 3 more doses  Decision-making capacity -Appreciate psychiatry input, the patient does not have capacity to make logical ordered decisions about SNF placement               MDM and disposition: The below labs and imaging reports were reviewed and summarized above.  Medication management as above.  The patient was admitted with DKA and  cellulitis of the foot with sepsis.  Her right foot was nonviable and so orthopedic was consulted for amputation.  She underwent amputation on 6/81, uncomplicated.    At present we will continue oral antibiotics, patient placement.  She will require rehabilitation for adapting to limited mobility with now double leg amputation, as well as performing ADLs and frequent lab monitoring.            DVT prophylaxis: Heparin Code Status: FULL Family Communication: None present    Consultants:   Psychiatry  Orthopedics  Procedures:   None  Antimicrobials:   Vancomycin 2/26 >>  Cefepime 2.26 >>    Subjective: No fever, chest pain, dyspnea, sputum.  No more urinary retention.  Leg pain is improved.  No hematochezia, melena, epistaxis.           Objective: Vitals:   05/13/18 1211 05/13/18 2023 05/14/18 0421 05/14/18 1452  BP: 126/64 (!) 107/58 (!) 146/78 124/79  Pulse: 94 93 85 88  Resp: 18 16 15 16   Temp: 99.4 F (37.4 C) 98.3 F (36.8 C) 98.6 F (37 C) 98.3 F (36.8 C)  TempSrc: Oral Oral Oral Oral  SpO2: 100% 97% 100% 100%  Weight:      Height:        Intake/Output Summary (Last 24 hours) at 05/14/2018 1709 Last data filed at 05/14/2018 1300 Gross per 24 hour  Intake 480 ml  Output 0 ml  Net 480 ml   Filed Weights   05/08/18 1255 05/09/18 0041 05/10/18 1112  Weight: 68 kg 59.8 kg 59.8 kg    Examination: General appearance: Female, sitting in bed, no acute distress, interactive HEENT: Anicteric, conjunctival pink, lids and lashes normal.  No nasal deformity, discharge, epistaxis.  Lips moist, edentulous.  Oropharynx moist, no oral lesions. Skin: Warm and dry.  no jaundice.  No suspicious rashes or lesions. Cardiac: Regular rate and rhythm, no murmurs, no lower extremity edema Respiratory: Respiratory effort is normal, no rales or wheezes. Abdomen: Soft without tenderness to palpation, rigidity, or rebound MSK: Bilateral BKA, stump was normal, shrinker  on the right leg, wound VAC in place in the left Neuro: Wake and alert, moves upper extremities with normal strength and coordination, speech fluent. Psych: Oriented to person, place, and time.  Attention normal.  Affect normal.   Data Reviewed: I have personally reviewed following labs and imaging studies:  CBC: Recent Labs  Lab 05/08/18 1306  05/10/18 0506 05/11/18 0447 05/12/18 0400 05/13/18 0330 05/14/18 0403  WBC 12.4*   < > 8.9 7.6 6.3 7.5 12.1*  NEUTROABS 10.0*  --   --   --   --   --   --   HGB 10.3*   < > 8.3* 7.8* 7.2* 6.9* 8.6*  HCT 33.2*   < > 26.7* 23.9* 22.7* 22.4* 27.3*  MCV 97.4   < > 93.7 93.0 94.2 95.3 92.2  PLT 324   < > 254 261 259 291 350   < > = values in this interval not displayed.   Basic Metabolic Panel: Recent Labs  Lab 05/10/18 0506 05/11/18 0447 05/12/18 0400 05/13/18 0330 05/14/18 0403  NA 133* 134* 136 135 132*  K 4.0 4.0 3.7 4.2 4.4  CL 105 106 108 108 103  CO2 21* 18* 23 19* 22  GLUCOSE 270* 203* 157* 205* 243*  BUN 10 10 10 12 16   CREATININE 0.65 0.47 0.51 0.53 0.52  CALCIUM 7.9* 8.2* 7.7* 7.7* 8.1*   GFR: Estimated Creatinine Clearance: 61.4 mL/min (by C-G formula based on SCr of 0.52 mg/dL). Liver Function Tests: Recent Labs  Lab 05/10/18 0506 05/11/18 0447 05/12/18 0400 05/13/18 0330 05/14/18 0403  AST 16 24 17 18 18   ALT 15 15 10 11 12   ALKPHOS 62 70 62 67 86  BILITOT 0.9 0.4 0.3 0.4 0.2*  PROT 5.9* 6.2* 5.6* 5.5* 6.4*  ALBUMIN 1.8* 1.8* 1.5* 1.6* 1.7*   No results for input(s): LIPASE, AMYLASE in the last 168 hours. No results for input(s): AMMONIA in the last 168 hours. Coagulation Profile: No results for input(s): INR, PROTIME in the last 168 hours. Cardiac Enzymes: No results for input(s): CKTOTAL, CKMB, CKMBINDEX, TROPONINI in the last 168 hours. BNP (last 3 results) No results for input(s): PROBNP in the last 8760 hours. HbA1C: No results for input(s): HGBA1C in the last 72 hours. CBG: Recent Labs  Lab  05/13/18 1146 05/13/18 1550 05/13/18 2026 05/14/18 0804 05/14/18 1143  GLUCAP 303* 146* 189* 176* 268*   Lipid Profile: No results for input(s): CHOL, HDL, LDLCALC, TRIG, CHOLHDL, LDLDIRECT in the last 72 hours. Thyroid Function Tests: No results for input(s): TSH, T4TOTAL, FREET4, T3FREE, THYROIDAB in the last 72 hours. Anemia Panel: No results for input(s): VITAMINB12, FOLATE, FERRITIN, TIBC, IRON, RETICCTPCT in the last 72 hours. Urine analysis:    Component Value Date/Time   COLORURINE YELLOW 05/09/2018 [place] 05/09/2018 0653   LABSPEC 1.018 05/09/2018 0653   PHURINE 5.0 05/09/2018 0653   GLUCOSEU >=500 (A) 05/09/2018 0653   HGBUR MODERATE (A) 05/09/2018 0653   BILIRUBINUR NEGATIVE 05/09/2018 0653   BILIRUBINUR N 04/28/2016 1603   KETONESUR 80 (A) 05/09/2018 0653   PROTEINUR 30 (A)  05/09/2018 0653   UROBILINOGEN 0.2 04/28/2016 1603   UROBILINOGEN 0.2 06/18/2013 0650   NITRITE NEGATIVE 05/09/2018 0653   LEUKOCYTESUR NEGATIVE 05/09/2018 0653   Sepsis Labs: @LABRCNTIP (procalcitonin:4,lacticacidven:4)  ) Recent Results (from the past 240 hour(s))  Culture, blood (routine x 2)     Status: None   Collection Time: 05/08/18  9:48 PM  Result Value Ref Range Status   Specimen Description SITE NOT SPECIFIED  Final   Special Requests   Final    BOTTLES DRAWN AEROBIC AND ANAEROBIC Blood Culture results may not be optimal due to an excessive volume of blood received in culture bottles Performed at [facility] [address], [place], [place] [address]    Culture NO GROWTH 5 DAYS  Final   Report Status 05/13/2018 FINAL  Final  Culture, blood (routine x 2)     Status: None   Collection Time: 05/08/18 10:11 PM  Result Value Ref Range Status   Specimen Description BLOOD RIGHT FOREARM  Final   Special Requests   Final    BOTTLES DRAWN AEROBIC AND ANAEROBIC Blood Culture results may not be optimal due to an inadequate volume of blood received in culture  bottles Performed at [facility], [place] [address] [place], [place] [address]    Culture NO GROWTH 5 DAYS  Final   Report Status 05/13/2018 FINAL  Final  MRSA PCR Screening     Status: None   Collection Time: 05/09/18 12:50 AM  Result Value Ref Range Status   MRSA by PCR NEGATIVE NEGATIVE Final    Comment:        The GeneXpert MRSA Assay (FDA approved for NASAL specimens only), is one component of a comprehensive MRSA colonization surveillance program. It is not intended to diagnose MRSA infection nor to guide or monitor treatment for MRSA infections. Performed at [facility], [place] [address], [place], [place] [address]          Radiology Studies: US Renal  Result Date: 05/13/2018 CLINICAL DATA:  Urinary retention. EXAM: RENAL / URINARY TRACT ULTRASOUND COMPLETE COMPARISON:  Abdominal ultrasound dated June 18, 2013. CT abdomen pelvis dated June 18, 2013. FINDINGS: Right Kidney: Renal measurements: 12.6 x 5.0 x 5.9 cm = volume: 195 mL . Echogenicity within normal limits. No mass or hydronephrosis visualized. Left Kidney: Renal measurements: 10.2 x 5.8 x 5.8 cm = volume: 181 mL. Limited visualization due to overlying bowel gas. Echogenicity within normal limits. No mass or hydronephrosis visualized. Bladder: Appears normal for degree of bladder distention. IMPRESSION: 1. No acute findings. Electronically Signed   By: Titus Dubin M.D.   On: 05/13/2018 11:10        Scheduled Meds: . escitalopram  20 mg Oral Daily  . feeding supplement (ENSURE ENLIVE)  237 mL Oral BID BM  . fluconazole  150 mg Oral Q72H  . gabapentin  300 mg Oral QHS  . heparin  5,000 Units Subcutaneous Q8H  . insulin aspart  0-15 Units Subcutaneous TID WC  . insulin aspart  0-5 Units Subcutaneous QHS  . insulin glargine  24 Units Subcutaneous Daily  . linagliptin  5 mg Oral Daily  . perphenazine  2 mg Oral BID   Continuous Infusions: . lactated ringers 10 mL/hr at 05/10/18 1506     LOS:  6 days    Time spent: 25 minutes   Edwin Dada, MD [facility] 05/14/2018, 5:09 PM     Please page through [place]:  [email or web address]  Password [email or web address] If 7PM-7AM, please contact night-coverage

## 2018-05-14 NOTE — Progress Notes (Signed)
Occupational Therapy Treatment Patient Details Name: Gina [name] MRN: [id number] DOB: 1960/07/15 Today's Date: 05/14/2018    History of present illness 58 yo admittted with DKA and sepsis with osteo of Left foot s/p BKA 2/28. PMhx: schizophrenia, Rt BKA, HTN, DM, PVD   OT comments  Pt progressing toward OT goals, however, still demonstrates impaired cognitive awareness of diagnosis severity. Pt engaged in AP transfers x2 to [facility] and recliner from chair with supervision for safety. Pt required VCs for reminder of sequence to being transfer and safety awareness. Pt demonstrates good UB and core strength/ coordition with functional AP transfers. Pt will benefit from continued skilled OT to address lateral scoot transfers with use of drop arm BSC to prepare for home setting. Pt will benefit from change of DC plan from [place] to SNF due to decreased caregiver support in home setting. Pt requires additional education of home safety, compensatory strategies, and strength training to prepare for safe transition and to maximize independence in home setting. OT will continue to follow acutely.    Follow Up Recommendations  SNF;Follow surgeon's recommendation for DC plan and follow-up therapies;Supervision/Assistance - 24 hour    Equipment Recommendations  3 in 1 bedside commode;Other (comment)(drop arm 3in1 required)    Recommendations for Other Services      Precautions / Restrictions Precautions Precautions: Fall Precaution Comments: bil bKA, no pillow under left knee Restrictions Weight Bearing Restrictions: Yes LLE Weight Bearing: Non weight bearing       Mobility Bed Mobility Overal bed mobility: Modified Independent Bed Mobility: Rolling;Supine to Sit Rolling: Supervision         General bed mobility comments: pt is able to get into long sitting in preparation for AP transfer OOB with increased time   Transfers Overall transfer level: Needs assistance   Transfers:  Comptroller transfers: Supervision  Lateral/Scoot Transfers: (for bed pad to move with pt.) General transfer comment: supervision for safety; no physical assist required just chair set up and positioning EOB    Balance Overall balance assessment: Needs assistance   Sitting balance-Leahy Scale: Good                                     ADL either performed or assessed with clinical judgement   ADL Overall ADL's : Needs assistance/impaired     Grooming: Set up;Sitting                   Toilet Transfer: Anterior/posterior;Requires drop arm;BSC   Toileting- Clothing Manipulation and Hygiene: Minimal assistance;Sitting/lateral lean;Sit to/from stand       Functional mobility during ADLs: Supervision/safety General ADL Comments: Pt performed Toilet transfer from bed to [facility] with anterior posterior transfer with supervision for safety and VCs to remember sequence. Pt demonstrates good UB strength.     Vision   Vision Assessment?: Vision impaired- to be further tested in functional context   Perception     Praxis      Cognition Arousal/Alertness: Awake/alert Behavior During Therapy: WFL for tasks assessed/performed Overall Cognitive Status: No family/caregiver present to determine baseline cognitive functioning Area of Impairment: Memory;Problem solving;Following commands;Safety/judgement                 Orientation Level: Disoriented to;Time   Memory: Decreased short-term memory Following Commands: Follows one step commands consistently     Problem Solving: Slow processing  Exercises     Shoulder Instructions       General Comments discussed postiioning and precuations for LLE    Pertinent Vitals/ Pain       Pain Assessment: Faces Faces Pain Scale: No hurt Pain Intervention(s): Monitored during session  Home Living                                           Prior Functioning/Environment              Frequency  Min 2X/week        Progress Toward Goals  OT Goals(current goals can now be found in the care plan section)  Progress towards OT goals: Progressing toward goals  Acute Rehab OT Goals Patient Stated Goal: to go to my friend's house OT Goal Formulation: With patient Time For Goal Achievement: 05/24/18 Potential to Achieve Goals: Good ADL Goals Pt Will Perform Grooming: Independently Pt Will Perform Upper Body Bathing: with set-up;Independently Pt Will Perform Lower Body Dressing: with modified independence;sitting/lateral leans Pt Will Transfer to Toilet: with set-up;bedside commode Additional ADL Goal #1: Pt will perform lateral transfers and anterior-posterior transfers with modified independence.  Plan Discharge plan needs to be updated    Co-evaluation                 AM-PAC OT "6 Clicks" Daily Activity     Outcome Measure   Help from another person eating meals?: None Help from another person taking care of personal grooming?: None Help from another person toileting, which includes using toliet, bedpan, or urinal?: None Help from another person bathing (including washing, rinsing, drying)?: A Lot Help from another person to put on and taking off regular upper body clothing?: A Little Help from another person to put on and taking off regular lower body clothing?: A Lot 6 Click Score: 19    End of Session Equipment Utilized During Treatment: Gait belt  OT Visit Diagnosis: Unsteadiness on feet (R26.81);Muscle weakness (generalized) (M62.81)   Activity Tolerance Patient tolerated treatment well   Patient Left in chair;with call bell/phone within reach;with chair alarm set   Nurse Communication Mobility status        Time: 7412-8786 OT Time Calculation (min): 23 min  Charges: OT General Charges $OT Visit: 1 Visit OT Treatments $Self Care/Home Management : 8-22 mins  Minus Breeding, MSOT,  OTR/L  Supplemental Rehabilitation Services  [phone]   Marius Ditch 05/14/2018, 11:19 AM
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
## 2018-05-15 DIAGNOSIS — Z008 Encounter for other general examination: Secondary | ICD-10-CM

## 2018-05-15 LAB — GLUCOSE, CAPILLARY
Glucose-Capillary: 186 mg/dL — ABNORMAL HIGH (ref 70–99)
Glucose-Capillary: 231 mg/dL — ABNORMAL HIGH (ref 70–99)
Glucose-Capillary: 160 mg/dL — ABNORMAL HIGH (ref 70–99)
Glucose-Capillary: 197 mg/dL — ABNORMAL HIGH (ref 70–99)

## 2018-05-15 LAB — BASIC METABOLIC PANEL
Anion gap: 6 (ref 5–15)
BUN: 15 mg/dL (ref 6–20)
CO2: 24 mmol/L (ref 22–32)
Calcium: 8 mg/dL — ABNORMAL LOW (ref 8.9–10.3)
Chloride: 105 mmol/L (ref 98–111)
Creatinine, Ser: 0.58 mg/dL (ref 0.44–1.00)
GFR calc Af Amer: >60 mL/min (ref >60)
GFR calc non Af Amer: >60 mL/min (ref >60)
Glucose, Bld: 227 mg/dL — ABNORMAL HIGH (ref 70–99)
Potassium: 4.6 mmol/L (ref 3.5–5.1)
Sodium: 135 mmol/L (ref 135–145)

## 2018-05-15 LAB — CBC
HCT: 27.1 % — ABNORMAL LOW (ref 36.0–46.0)
Hemoglobin: 8.4 g/dL — ABNORMAL LOW (ref 12.0–15.0)
MCH: 29.2 pg (ref 26.0–34.0)
MCHC: 31 g/dL (ref 30.0–36.0)
MCV: 94.1 fL (ref 80.0–100.0)
Platelets: 388 10*3/uL (ref 150–400)
RBC: 2.88 MIL/uL — ABNORMAL LOW (ref 3.87–5.11)
RDW: 15.9 % — ABNORMAL HIGH (ref 11.5–15.5)
WBC: 9.3 10*3/uL (ref 4.0–10.5)
nRBC: 0 % (ref 0.0–0.2)

## 2018-05-15 MED ORDER — INSULIN GLARGINE 100 UNIT/ML ~~LOC~~ SOLN
30.0000 [IU] | Freq: Every day | SUBCUTANEOUS | Status: DC
  Administered 2018-05-16: 30 [IU] via SUBCUTANEOUS
  Filled 2018-05-15: qty 0.3

## 2018-05-15 NOTE — Progress Notes (Signed)
PROGRESS NOTE    Gina [name]  ERD:[id number] DOB: May 19, 1960 DOA: 05/08/2018 PCP: Gina Spruce, FNP      Brief Narrative:  [name] is a 58 y.o. F with DM, schizophrenia, HTN and recurrent foot amputations (and right BKA) who presented from her prosthetic clinic for redness of the foot and pain in setting of fever, chills.  Patient was found to have left lower extremity cellulitis, osteomyelitis of the left calcaneus.  Orthopedic surgery was consulted, underwent left below-knee amputation on 05/12/2018.  Patient was also noted to have hallucinations, concerning this psych was consulted, recommended to continue with perphenazine.  Patient was also found to have poorly controlled diabetes mellitus.  Oral medications were discontinued, started the patient on Lantus.  Plan to discharge patient to skilled nursing facility.    Assessment & Plan:  Sepsis from diabetic foot infection -Blood cultures no growth.    Source control achieved.    -Continue Augmentin 875-125 mg for 3 more days -Consult orthopedics, underwent below-knee amputation of left leg   ##Diabetes, poorly controlled, insulin dependent, with peripheral neuropathy -Get hemoglobin A1c -Glucoses better -Continue Lantus -Continue SSI insulin -Hold metformin -Continue Januvia as formulary alternative  ##Hypertension - BP elevated -Continue amlodipine, lisinopril -Restart Lasix at d/c  ##Schizophrenia The patient is disorganized and tangential but not actively hallucinating nor agitated.  She was evaluated by psychiatry, she has mild auditory hallucinations which are chronic, no active mania or hallucinations.   Not currently on disease modifying therapy.    -Consult psychiatry -Continue new perphenazine/Trilafon -Continue SSRI -Will need referral to [facility] at d/c from SNF  ##AKI -Secondary to ATN Resolved.  ##Acute on chronic anemia of chronic disease Acute postoperative blood loss anemia - Appropriate  rise in Hgb post-trasnfusion on 05/12/2018.  Transfused 1 unit this hospitalization -Transfusion thershold 7 g/dL -Continue iron   ##Vaginal candidiasis Severe. -Continue fluconazole transition to 150 q72hrs for 3 more doses  ##Decision-making capacity -Appreciate psychiatry input, the patient does not have capacity to make logical ordered decisions about SNF placement  ##Severe protein calorie malnutrition -Patient was unable to care for herself -Continue with high-protein diet   MDM and disposition: The below labs and imaging reports were reviewed and summarized above.  Medication management as above.  The patient was admitted with DKA and cellulitis of the foot with sepsis.  Her right foot was nonviable and so orthopedic was consulted for amputation.  She underwent amputation on 5/63, uncomplicated.    At present we will continue oral antibiotics, patient placement.  She will require rehabilitation for adapting to limited mobility with now double leg amputation, as well as performing ADLs and frequent lab monitoring.            DVT prophylaxis: Heparin Code Status: FULL Family Communication: None present    Consultants:   Psychiatry  Orthopedics  Procedures:   None  Antimicrobials:   Vancomycin 2/26 >>  Cefepime 2.26 >>    Subjective: Complaining of pain in the left stump area.  Sitting in the chair       Objective: Vitals:   05/14/18 2041 05/15/18 0408 05/15/18 0822 05/15/18 1522  BP: 123/66 (!) 147/79 133/68 133/69  Pulse: 86 82 85 88  Resp: 18 18 16 17   Temp: 98.4 F (36.9 C) 98.2 F (36.8 C) (!) 97.3 F (36.3 C) 98.8 F (37.1 C)  TempSrc: Oral Oral Oral   SpO2: 100% 100% 100% 100%  Weight:      Height:  Intake/Output Summary (Last 24 hours) at 05/15/2018 1629 Last data filed at 05/15/2018 1000 Gross per 24 hour  Intake 720 ml  Output 325 ml  Net 395 ml   Filed Weights   05/08/18 1255 05/09/18 0041 05/10/18 1112  Weight: 68  kg 59.8 kg 59.8 kg    Examination: General appearance: Female, sitting in bed, no acute distress, interactive HEENT: Anicteric, conjunctival pink, lids and lashes normal.  No nasal deformity, discharge, epistaxis.  Lips moist, edentulous.  Oropharynx moist, no oral lesions. Skin: Warm and dry.  no jaundice.  No suspicious rashes or lesions. Cardiac: Regular rate and rhythm, no murmurs, no lower extremity edema Respiratory: Respiratory effort is normal, no rales or wheezes. Abdomen: Soft without tenderness to palpation, rigidity, or rebound MSK: Bilateral BKA, stump was normal, shrinker on the right leg, wound VAC in place in the left Neuro: Wake and alert, moves upper extremities with normal strength and coordination, speech fluent. Psych: Oriented to person, place, and time.  Attention normal.  Affect normal.   Data Reviewed: I have personally reviewed following labs and imaging studies:  CBC: Recent Labs  Lab 05/11/18 0447 05/12/18 0400 05/13/18 0330 05/14/18 0403 05/15/18 0354  WBC 7.6 6.3 7.5 12.1* 9.3  HGB 7.8* 7.2* 6.9* 8.6* 8.4*  HCT 23.9* 22.7* 22.4* 27.3* 27.1*  MCV 93.0 94.2 95.3 92.2 94.1  PLT 261 259 291 350 846   Basic Metabolic Panel: Recent Labs  Lab 05/11/18 0447 05/12/18 0400 05/13/18 0330 05/14/18 0403 05/15/18 0354  NA 134* 136 135 132* 135  K 4.0 3.7 4.2 4.4 4.6  CL 106 108 108 103 105  CO2 18* 23 19* 22 24  GLUCOSE 203* 157* 205* 243* 227*  BUN 10 10 12 16 15   CREATININE 0.47 0.51 0.53 0.52 0.58  CALCIUM 8.2* 7.7* 7.7* 8.1* 8.0*   GFR: Estimated Creatinine Clearance: 61.4 mL/min (by C-G formula based on SCr of 0.58 mg/dL). Liver Function Tests: Recent Labs  Lab 05/10/18 0506 05/11/18 0447 05/12/18 0400 05/13/18 0330 05/14/18 0403  AST 16 24 17 18 18   ALT 15 15 10 11 12   ALKPHOS 62 70 62 67 86  BILITOT 0.9 0.4 0.3 0.4 0.2*  PROT 5.9* 6.2* 5.6* 5.5* 6.4*  ALBUMIN 1.8* 1.8* 1.5* 1.6* 1.7*   No results for input(s): LIPASE, AMYLASE in  the last 168 hours. No results for input(s): AMMONIA in the last 168 hours. Coagulation Profile: No results for input(s): INR, PROTIME in the last 168 hours. Cardiac Enzymes: No results for input(s): CKTOTAL, CKMB, CKMBINDEX, TROPONINI in the last 168 hours. BNP (last 3 results) No results for input(s): PROBNP in the last 8760 hours. HbA1C: No results for input(s): HGBA1C in the last 72 hours. CBG: Recent Labs  Lab 05/14/18 1143 05/14/18 1654 05/14/18 2148 05/15/18 0823 05/15/18 1219  GLUCAP 268* 130* 194* 186* 231*   Lipid Profile: No results for input(s): CHOL, HDL, LDLCALC, TRIG, CHOLHDL, LDLDIRECT in the last 72 hours. Thyroid Function Tests: No results for input(s): TSH, T4TOTAL, FREET4, T3FREE, THYROIDAB in the last 72 hours. Anemia Panel: No results for input(s): VITAMINB12, FOLATE, FERRITIN, TIBC, IRON, RETICCTPCT in the last 72 hours. Urine analysis:    Component Value Date/Time   COLORURINE YELLOW 05/09/2018 [place] 05/09/2018 0653   LABSPEC 1.018 05/09/2018 0653   PHURINE 5.0 05/09/2018 0653   GLUCOSEU >=500 (A) 05/09/2018 0653   HGBUR MODERATE (A) 05/09/2018 [place] 05/09/2018 0653   BILIRUBINUR N 04/28/2016  [place] (A) 05/09/2018 0653   PROTEINUR 30 (A) 05/09/2018 0653   UROBILINOGEN 0.2 04/28/2016 1603   UROBILINOGEN 0.2 06/18/2013 0650   NITRITE NEGATIVE 05/09/2018 0653   LEUKOCYTESUR NEGATIVE 05/09/2018 0653   Sepsis Labs: @LABRCNTIP (procalcitonin:4,lacticacidven:4)  ) Recent Results (from the past 240 hour(s))  Culture, blood (routine x 2)     Status: None   Collection Time: 05/08/18  9:48 PM  Result Value Ref Range Status   Specimen Description SITE NOT SPECIFIED  Final   Special Requests   Final    BOTTLES DRAWN AEROBIC AND ANAEROBIC Blood Culture results may not be optimal due to an excessive volume of blood received in culture bottles Performed at [facility] [address],  [place], [place] [address]    Culture NO GROWTH 5 DAYS  Final   Report Status 05/13/2018 FINAL  Final  Culture, blood (routine x 2)     Status: None   Collection Time: 05/08/18 10:11 PM  Result Value Ref Range Status   Specimen Description BLOOD RIGHT FOREARM  Final   Special Requests   Final    BOTTLES DRAWN AEROBIC AND ANAEROBIC Blood Culture results may not be optimal due to an inadequate volume of blood received in culture bottles Performed at [facility], [place] [address] [place], [place] [address]    Culture NO GROWTH 5 DAYS  Final   Report Status 05/13/2018 FINAL  Final  MRSA PCR Screening     Status: None   Collection Time: 05/09/18 12:50 AM  Result Value Ref Range Status   MRSA by PCR NEGATIVE NEGATIVE Final    Comment:        The GeneXpert MRSA Assay (FDA approved for NASAL specimens only), is one component of a comprehensive MRSA colonization surveillance program. It is not intended to diagnose MRSA infection nor to guide or monitor treatment for MRSA infections. Performed at [facility], [place] [address] [place],  [address]          Radiology Studies: No results found.      Scheduled Meds: . escitalopram  20 mg Oral Daily  . feeding supplement (ENSURE ENLIVE)  237 mL Oral BID BM  . fluconazole  150 mg Oral Q72H  . gabapentin  300 mg Oral QHS  . heparin  5,000 Units Subcutaneous Q8H  . insulin aspart  0-15 Units Subcutaneous TID WC  . insulin aspart  0-5 Units Subcutaneous QHS  . insulin glargine  24 Units Subcutaneous Daily  . linagliptin  5 mg Oral Daily  . perphenazine  2 mg Oral BID   Continuous Infusions: . lactated ringers 10 mL/hr at 05/10/18 1506     LOS: 7 days    Time spent: 25 minutes   Elye Harmsen, MD [facility] 05/15/2018, 4:29 PM     Please page through [place]:  [email or web address] Password [email or web address] If 7PM-7AM, please contact night-coverage

## 2018-05-15 NOTE — Progress Notes (Signed)
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 PMhx: schizophrenia, Rt BKA, HTN, DM, PVD    PT Comments    Pt very agreeable to get up to chair today with therapy. Pt requires supervision for bed mobility and A-P transfer to recliner. Pt session abbreviated as physician entered room to evaluate pt. PT will continue to follow.    Follow Up Recommendations  SNF;Supervision/Assistance - 24 hour     Equipment Recommendations  Wheelchair (measurements PT)(pt reports they took her chair at admission)       Precautions / Restrictions Precautions Precautions: Fall Precaution Comments: bil bKA, no pillow under left knee Restrictions Weight Bearing Restrictions: Yes LLE Weight Bearing: Non weight bearing    Mobility  Bed Mobility Overal bed mobility: Modified Independent             General bed mobility comments: pt is able to get into long sitting in preparation for AP transfer OOB with increased time   Transfers Overall transfer level: Needs assistance   Transfers: Comptroller transfers: Supervision   General transfer comment: supervision for safety; no physical assist required just chair set up and positioning EOB  Ambulation/Gait             General Gait Details: unable         Balance Overall balance assessment: Needs assistance   Sitting balance-Leahy Scale: Good                                      Cognition Arousal/Alertness: Awake/alert Behavior During Therapy: WFL for tasks assessed/performed Overall Cognitive Status: No family/caregiver present to determine baseline cognitive functioning Area of Impairment: Memory;Problem solving;Following commands;Safety/judgement                     Memory: Decreased  short-term memory Following Commands: Follows one step commands consistently     Problem Solving: Slow processing               Pertinent Vitals/Pain Pain Assessment: Faces Faces Pain Scale: No hurt           PT Goals (current goals can now be found in the care plan section) Acute Rehab PT Goals PT Goal Formulation: With patient Time For Goal Achievement: 05/18/18 Potential to Achieve Goals: Fair Progress towards PT goals: Progressing toward goals    Frequency    Min 3X/week      PT Plan Current plan remains appropriate       AM-PAC PT "6 Clicks" Mobility   Outcome Measure  Help needed turning from your back to your side while in a flat bed without using bedrails?: A Little Help needed moving from lying on your back to sitting on the side of a flat bed without using bedrails?: None Help needed moving to and from a bed to a chair (including a wheelchair)?: A Little Help needed standing up from a chair using your arms (e.g., wheelchair or bedside chair)?: Total Help needed to walk in hospital room?: Total Help needed climbing 3-5 steps with a railing? : Total 6 Click Score: 13    End of Session   Activity Tolerance: Patient tolerated treatment well Patient left: in chair;with call bell/phone  within reach;with chair alarm set Nurse Communication: Mobility status;Precautions PT Visit Diagnosis: Other abnormalities of gait and mobility (R26.89)     Time: 2411-4643 PT Time Calculation (min) (ACUTE ONLY): 10 min  Charges:  $Therapeutic Activity: 8-22 mins                     Loran Fleet B. Migdalia Dk PT, DPT Acute Rehabilitation Services Pager [phone] Office [phone]    [place] 05/15/2018, 1:32 PM

## 2018-05-15 NOTE — Plan of Care (Signed)
  Problem: Pain Managment: Goal: General experience of comfort will improve Outcome: Progressing   

## 2018-05-16 DIAGNOSIS — F320 Major depressive disorder, single episode, mild: Secondary | ICD-10-CM

## 2018-05-16 LAB — BASIC METABOLIC PANEL
Anion gap: 5 (ref 5–15)
BUN: 17 mg/dL (ref 6–20)
CO2: 28 mmol/L (ref 22–32)
Calcium: 8.5 mg/dL — ABNORMAL LOW (ref 8.9–10.3)
Chloride: 100 mmol/L (ref 98–111)
Creatinine, Ser: 0.73 mg/dL (ref 0.44–1.00)
GFR calc Af Amer: >60 mL/min (ref >60)
GFR calc non Af Amer: >60 mL/min (ref >60)
Glucose, Bld: 271 mg/dL — ABNORMAL HIGH (ref 70–99)
Potassium: 4.6 mmol/L (ref 3.5–5.1)
Sodium: 133 mmol/L — ABNORMAL LOW (ref 135–145)

## 2018-05-16 LAB — CBC
HCT: 27.8 % — ABNORMAL LOW (ref 36.0–46.0)
Hemoglobin: 8.8 g/dL — ABNORMAL LOW (ref 12.0–15.0)
MCH: 29.8 pg (ref 26.0–34.0)
MCHC: 31.7 g/dL (ref 30.0–36.0)
MCV: 94.2 fL (ref 80.0–100.0)
Platelets: 491 10*3/uL — ABNORMAL HIGH (ref 150–400)
RBC: 2.95 MIL/uL — ABNORMAL LOW (ref 3.87–5.11)
RDW: 15.6 % — ABNORMAL HIGH (ref 11.5–15.5)
WBC: 8.3 10*3/uL (ref 4.0–10.5)
nRBC: 0 % (ref 0.0–0.2)

## 2018-05-16 LAB — GLUCOSE, CAPILLARY
Glucose-Capillary: 252 mg/dL — ABNORMAL HIGH (ref 70–99)
Glucose-Capillary: 106 mg/dL — ABNORMAL HIGH (ref 70–99)
Glucose-Capillary: 94 mg/dL (ref 70–99)
Glucose-Capillary: 166 mg/dL — ABNORMAL HIGH (ref 70–99)

## 2018-05-16 LAB — HEMOGLOBIN A1C
Hgb A1c MFr Bld: 15 % — ABNORMAL HIGH (ref 4.8–5.6)
Mean Plasma Glucose: 383.8 mg/dL

## 2018-05-16 MED ORDER — INSULIN GLARGINE 100 UNIT/ML ~~LOC~~ SOLN
35.0000 [IU] | Freq: Every day | SUBCUTANEOUS | Status: DC
  Administered 2018-05-17 – 2018-05-18 (×2): 35 [IU] via SUBCUTANEOUS
  Filled 2018-05-16 (×2): qty 0.35

## 2018-05-16 NOTE — Progress Notes (Signed)
PASRR still pending and required face to face assessment by [place] PASRR.   Once PASRR number is acquired, [facility] can make bed offer and patient will dc.   CSW will continue to follow up, PASRR face to face assessments can take up to 4 days for them to complete.   [place], [place]

## 2018-05-16 NOTE — Progress Notes (Signed)
PROGRESS NOTE    Gina [name]  KDX:[id number] DOB: [birth year] DOA: 05/08/2018 PCP: Alfonse Spruce, FNP      Brief Narrative:  Gina [name] is a 58 y.o. F with DM, schizophrenia, HTN and recurrent foot amputations (and right BKA) who presented from her prosthetic clinic for redness of the foot and pain in setting of fever, chills.  Patient was found to have left lower extremity cellulitis, osteomyelitis of the left calcaneus.  Orthopedic surgery was consulted, underwent left below-knee amputation on 05/12/2018.  Patient was also noted to have hallucinations, concerning this psych was consulted, recommended to continue with perphenazine.  Patient was also found to have poorly controlled diabetes mellitus.  Oral medications were discontinued, started the patient on Lantus.  Plan to discharge patient to skilled nursing facility.    Assessment & Plan:  ##Sepsis from diabetic foot infection -Blood cultures no growth.    Source control achieved.    -Continue Augmentin 875-125 mg for 3 more days -Consult orthopedics, underwent below-knee amputation of left leg   ##Diabetes, poorly controlled, insulin dependent, with peripheral neuropathy -Get hemoglobin A1c-15 -Glucoses better -Increase Lantus 35 units -Continue SSI insulin -Hold metformin -Continue Januvia as formulary alternative  ##Hypertension - BP elevated -Continue amlodipine, lisinopril -Restart Lasix at d/c  ##Schizophrenia The patient is disorganized and tangential but not actively hallucinating nor agitated.  She was evaluated by psychiatry, she has mild auditory hallucinations which are chronic, no active mania or hallucinations.   Not currently on disease modifying therapy.    -Consult psychiatry -Continue new perphenazine/Trilafon -Continue SSRI -Will need referral to [facility] at d/c from SNF  ##AKI -Secondary to ATN Resolved.  ##Acute on chronic anemia of chronic disease Acute postoperative blood loss anemia -  Appropriate rise in Hgb post-trasnfusion on 05/12/2018.  Transfused 1 unit this hospitalization -Transfusion thershold 7 g/dL -Continue iron   ##Vaginal candidiasis Severe. -Continue fluconazole transition to 150 q72hrs for 3 more doses  ##Decision-making capacity -Appreciate psychiatry input, the patient does not have capacity to make logical ordered decisions about SNF placement  ##Severe protein calorie malnutrition -Patient was unable to care for herself -Continue with high-protein diet   MDM and disposition: The below labs and imaging reports were reviewed and summarized above.  Medication management as above.  The patient was admitted with DKA and cellulitis of the foot with sepsis.  Her right foot was nonviable and so orthopedic was consulted for amputation.  She underwent amputation on 9/76, uncomplicated.    At present we will continue oral antibiotics, patient placement.  She will require rehabilitation for adapting to limited mobility with now double leg amputation, as well as performing ADLs and frequent lab monitoring.            DVT prophylaxis: Heparin Code Status: FULL Family Communication: None present    Consultants:   Psychiatry  Orthopedics  Procedures:   None  Antimicrobials:   Vancomycin 2/26 >>  Cefepime 2.26 >>    Subjective: Denies having any complaints.  Patient was eating lunch      Objective: Vitals:   05/15/18 1522 05/15/18 2101 05/16/18 0335 05/16/18 0527  BP: 133/69 133/73 (!) 160/76   Pulse: 88 90 86   Resp: 17 18 15    Temp: 98.8 F (37.1 C) 98.5 F (36.9 C) 98.3 F (36.8 C)   TempSrc:  Oral Oral   SpO2: 100% 100% 100%   Weight:    69.3 kg  Height:        Intake/Output Summary (  Last 24 hours) at 05/16/2018 1540 Last data filed at 05/16/2018 0915 Gross per 24 hour  Intake 960 ml  Output 0 ml  Net 960 ml   Filed Weights   05/09/18 0041 05/10/18 1112 05/16/18 0527  Weight: 59.8 kg 59.8 kg 69.3 kg     Examination: General appearance: Female, sitting in bed, no acute distress, interactive HEENT: Anicteric, conjunctival pink, lids and lashes normal.  No nasal deformity, discharge, epistaxis.  Lips moist, edentulous.  Oropharynx moist, no oral lesions. Skin: Warm and dry.  no jaundice.  No suspicious rashes or lesions. Cardiac: Regular rate and rhythm, no murmurs, no lower extremity edema Respiratory: Respiratory effort is normal, no rales or wheezes. Abdomen: Soft without tenderness to palpation, rigidity, or rebound MSK: Bilateral BKA, stump was normal, shrinker on the right leg, wound VAC in place in the left Neuro: Wake and alert, moves upper extremities with normal strength and coordination, speech fluent. Psych: Oriented to person, place, and time.  Attention normal.  Affect normal.   Data Reviewed: I have personally reviewed following labs and imaging studies:  CBC: Recent Labs  Lab 05/12/18 0400 05/13/18 0330 05/14/18 0403 05/15/18 0354 05/16/18 0315  WBC 6.3 7.5 12.1* 9.3 8.3  HGB 7.2* 6.9* 8.6* 8.4* 8.8*  HCT 22.7* 22.4* 27.3* 27.1* 27.8*  MCV 94.2 95.3 92.2 94.1 94.2  PLT 259 291 350 388 607*   Basic Metabolic Panel: Recent Labs  Lab 05/12/18 0400 05/13/18 0330 05/14/18 0403 05/15/18 0354 05/16/18 0315  NA 136 135 132* 135 133*  K 3.7 4.2 4.4 4.6 4.6  CL 108 108 103 105 100  CO2 23 19* 22 24 28   GLUCOSE 157* 205* 243* 227* 271*  BUN 10 12 16 15 17   CREATININE 0.51 0.53 0.52 0.58 0.73  CALCIUM 7.7* 7.7* 8.1* 8.0* 8.5*   GFR: Estimated Creatinine Clearance: 70.8 mL/min (by C-G formula based on SCr of 0.73 mg/dL). Liver Function Tests: Recent Labs  Lab 05/10/18 0506 05/11/18 0447 05/12/18 0400 05/13/18 0330 05/14/18 0403  AST 16 24 17 18 18   ALT 15 15 10 11 12   ALKPHOS 62 70 62 67 86  BILITOT 0.9 0.4 0.3 0.4 0.2*  PROT 5.9* 6.2* 5.6* 5.5* 6.4*  ALBUMIN 1.8* 1.8* 1.5* 1.6* 1.7*   No results for input(s): LIPASE, AMYLASE in the last 168  hours. No results for input(s): AMMONIA in the last 168 hours. Coagulation Profile: No results for input(s): INR, PROTIME in the last 168 hours. Cardiac Enzymes: No results for input(s): CKTOTAL, CKMB, CKMBINDEX, TROPONINI in the last 168 hours. BNP (last 3 results) No results for input(s): PROBNP in the last 8760 hours. HbA1C: Recent Labs    05/16/18 0315  HGBA1C 15.0*   CBG: Recent Labs  Lab 05/15/18 1219 05/15/18 1603 05/15/18 2121 05/16/18 0802 05/16/18 1157  GLUCAP 231* 160* 197* 252* 106*   Lipid Profile: No results for input(s): CHOL, HDL, LDLCALC, TRIG, CHOLHDL, LDLDIRECT in the last 72 hours. Thyroid Function Tests: No results for input(s): TSH, T4TOTAL, FREET4, T3FREE, THYROIDAB in the last 72 hours. Anemia Panel: No results for input(s): VITAMINB12, FOLATE, FERRITIN, TIBC, IRON, RETICCTPCT in the last 72 hours. Urine analysis:    Component Value Date/Time   COLORURINE YELLOW 05/09/2018 [place] 05/09/2018 0653   LABSPEC 1.018 05/09/2018 0653   PHURINE 5.0 05/09/2018 0653   GLUCOSEU >=500 (A) 05/09/2018 0653   HGBUR MODERATE (A) 05/09/2018 [place] 05/09/2018 0653   BILIRUBINUR N 04/28/2016  [place] (A) 05/09/2018 0653   PROTEINUR 30 (A) 05/09/2018 0653   UROBILINOGEN 0.2 04/28/2016 1603   UROBILINOGEN 0.2 06/18/2013 0650   NITRITE NEGATIVE 05/09/2018 0653   LEUKOCYTESUR NEGATIVE 05/09/2018 0653   Sepsis Labs: @LABRCNTIP (procalcitonin:4,lacticacidven:4)  ) Recent Results (from the past 240 hour(s))  Culture, blood (routine x 2)     Status: None   Collection Time: 05/08/18  9:48 PM  Result Value Ref Range Status   Specimen Description SITE NOT SPECIFIED  Final   Special Requests   Final    BOTTLES DRAWN AEROBIC AND ANAEROBIC Blood Culture results may not be optimal due to an excessive volume of blood received in culture bottles Performed at [facility] [address] [place], [place]  [address]    Culture NO GROWTH 5 DAYS  Final   Report Status 05/13/2018 FINAL  Final  Culture, blood (routine x 2)     Status: None   Collection Time: 05/08/18 10:11 PM  Result Value Ref Range Status   Specimen Description BLOOD RIGHT FOREARM  Final   Special Requests   Final    BOTTLES DRAWN AEROBIC AND ANAEROBIC Blood Culture results may not be optimal due to an inadequate volume of blood received in culture bottles Performed at [facility], [place] [address] [place], [place] [address]    Culture NO GROWTH 5 DAYS  Final   Report Status 05/13/2018 FINAL  Final  MRSA PCR Screening     Status: None   Collection Time: 05/09/18 12:50 AM  Result Value Ref Range Status   MRSA by PCR NEGATIVE NEGATIVE Final    Comment:        The GeneXpert MRSA Assay (FDA approved for NASAL specimens only), is one component of a comprehensive MRSA colonization surveillance program. It is not intended to diagnose MRSA infection nor to guide or monitor treatment for MRSA infections. Performed at [facility], [place] [address], [place],  [address]          Radiology Studies: No results found.      Scheduled Meds: . escitalopram  20 mg Oral Daily  . feeding supplement (ENSURE ENLIVE)  237 mL Oral BID BM  . fluconazole  150 mg Oral Q72H  . gabapentin  300 mg Oral QHS  . heparin  5,000 Units Subcutaneous Q8H  . insulin aspart  0-15 Units Subcutaneous TID WC  . insulin aspart  0-5 Units Subcutaneous QHS  . insulin glargine  30 Units Subcutaneous Daily  . linagliptin  5 mg Oral Daily  . perphenazine  2 mg Oral BID   Continuous Infusions: . lactated ringers 10 mL/hr at 05/10/18 1506     LOS: 8 days    Time spent: 25 minutes   Jazminn Pomales, MD [facility] 05/16/2018, 3:40 PM     Please page through [place]:  [email or web address] Password [email or web address] If 7PM-7AM, please contact night-coverage

## 2018-05-16 NOTE — Plan of Care (Signed)
  Problem: Pain Managment: Goal: General experience of comfort will improve Outcome: Progressing   

## 2018-05-16 NOTE — Progress Notes (Signed)
Nutrition Follow-up  DOCUMENTATION CODES:   Not applicable  INTERVENTION:    Continue Ensure Enlive po BID, each supplement provides 350 kcal and 20 grams of protein  NUTRITION DIAGNOSIS:   Increased nutrient needs related to wound healing as evidenced by estimated needs.  Ongoing  GOAL:   Patient will meet greater than or equal to 90% of their needs  Met with intake of meals and supplements  MONITOR:   PO intake, Supplement acceptance   ASSESSMENT:   58 y.o. female with history of severe peripheral vascular disease, R BKA, diabetes, hypertension. Presents with gangrene osteomyelitis left heel  Patient reports good appetite and good intake. She has been receiving Ensure Enlive supplements BID between meals and she has been drinking two every day.  She is consuming 100% of meals.  Patient requested information to review about her diet. Provided "Heart Healthy Carbohydrate Consistent Nutrition Therapy" handout.   Labs and medications reviewed.    Diet Order:   Diet Order            Diet heart healthy/carb modified Room service appropriate? Yes; Fluid consistency: Thin  Diet effective now              EDUCATION NEEDS:   Education needs have been addressed(Provided "heart healthy carbohydrate consistent nutrition therapy" handout from the Academy of Nutrition and Dietetics)  Skin:  Skin Assessment: Skin Integrity Issues: Skin Integrity Issues:: Incisions, Wound VAC Wound Vac: L leg Incisions: L leg  Last BM:  Unknown  Height:   Ht Readings from Last 1 Encounters:  05/10/18 5' 2"  (1.575 m)    Weight:   Wt Readings from Last 1 Encounters:  05/16/18 69.3 kg    Ideal Body Weight:  46.75 kg(adjusted for R BKA)  BMI:  32 (using adjusted weight for bilateral amputations)  Estimated Nutritional Needs:   Kcal:  1750-1950  Protein:  80-90 grams  Fluid:  1.7-2 L/day    Molli Barrows, RD, LDN, [place] Pager [phone] After Hours Pager [phone]

## 2018-05-17 LAB — BASIC METABOLIC PANEL
ANION GAP: 6 (ref 5–15)
BUN: 14 mg/dL (ref 6–20)
CO2: 28 mmol/L (ref 22–32)
Calcium: 8.7 mg/dL — ABNORMAL LOW (ref 8.9–10.3)
Chloride: 101 mmol/L (ref 98–111)
Creatinine, Ser: 0.61 mg/dL (ref 0.44–1.00)
GFR calc Af Amer: >60 mL/min (ref >60)
GFR calc non Af Amer: >60 mL/min (ref >60)
Glucose, Bld: 187 mg/dL — ABNORMAL HIGH (ref 70–99)
Potassium: 4.8 mmol/L (ref 3.5–5.1)
Sodium: 135 mmol/L (ref 135–145)

## 2018-05-17 LAB — GLUCOSE, CAPILLARY
Glucose-Capillary: 140 mg/dL — ABNORMAL HIGH (ref 70–99)
Glucose-Capillary: 141 mg/dL — ABNORMAL HIGH (ref 70–99)
Glucose-Capillary: 145 mg/dL — ABNORMAL HIGH (ref 70–99)
Glucose-Capillary: 98 mg/dL (ref 70–99)

## 2018-05-17 NOTE — Progress Notes (Signed)
Subjective: 7 Days Post-Op Procedure(s) (LRB): LEFT BELOW KNEE AMPUTATION (Left) Patient reports pain as mild.    Objective: Vital signs in last 24 hours: Temp:  [98.3 F (36.8 C)-98.5 F (36.9 C)] 98.3 F (36.8 C) (03/06 0515) Pulse Rate:  [88-91] 88 (03/06 0515) Resp:  [16-18] 18 (03/06 0515) BP: (114-129)/(67-71) 116/71 (03/06 0515) SpO2:  [98 %-100 %] 98 % (03/06 0515)  Intake/Output from previous day: No intake/output data recorded. Intake/Output this shift: No intake/output data recorded.  Recent Labs    05/15/18 0354 05/16/18 0315  HGB 8.4* 8.8*   Recent Labs    05/15/18 0354 05/16/18 0315  WBC 9.3 8.3  RBC 2.88* 2.95*  HCT 27.1* 27.8*  PLT 388 491*   Recent Labs    05/16/18 0315 05/17/18 0222  NA 133* 135  K 4.6 4.8  CL 100 101  CO2 28 28  BUN 17 14  CREATININE 0.73 0.61  GLUCOSE 271* 187*  CALCIUM 8.5* 8.7*   No results for input(s): LABPT, INR in the last 72 hours.  VAC dressing in place over the left transtibial amputation site and functioning well. NO drainage in VAC canister.    Assessment/Plan: 7 Days Post-Op Procedure(s) (LRB): LEFT BELOW KNEE AMPUTATION (Left) Continue VAC dressing Continue therapy.  Continues Augmentin x 2 more days.  Plan DC to SNF   [facility], PA-C 05/17/2018, 8:43 AM  No Name

## 2018-05-17 NOTE — Progress Notes (Signed)
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 PMhx: schizophrenia, Rt BKA, HTN, DM, PVD    PT Comments    Pt up in recliner at start of PT session. Performed L BKA exercises with supervision. Pt puts forth good effort.   Follow Up Recommendations  SNF;Supervision/Assistance - 24 hour     Equipment Recommendations  Wheelchair (measurements PT)(pt reports they took her chair at admission)    Recommendations for Other Services       Precautions / Restrictions Precautions Precautions: Fall Restrictions Weight Bearing Restrictions: Yes LLE Weight Bearing: Non weight bearing    Mobility  Bed Mobility               General bed mobility comments: up in recliner  Transfers                 General transfer comment: up in recliner  Ambulation/Gait             General Gait Details: unable   Stairs             Wheelchair Mobility    Modified Rankin (Stroke Patients Only)       Balance                                            Cognition Arousal/Alertness: Awake/alert Behavior During Therapy: WFL for tasks assessed/performed Overall Cognitive Status: No family/caregiver present to determine baseline cognitive functioning Area of Impairment: Memory;Problem solving;Following commands;Safety/judgement                     Memory: Decreased short-term memory Following Commands: Follows one step commands consistently     Problem Solving: Slow processing        Exercises General Exercises - Lower Extremity Short Arc Quad: AROM;Left;10 reps;Supine Amputee Exercises Quad Sets: AROM;10 reps;Supine;Left Gluteal Sets: AROM;Both;10 reps;Supine Towel Squeeze: AROM;Both;10 reps;Supine Hip ABduction/ADduction: AROM;Supine;10 reps;Left Knee  Flexion: AROM;Left;10 reps;Supine Straight Leg Raises: AROM;10 reps;Supine;Left    General Comments        Pertinent Vitals/Pain Pain Assessment: No/denies pain Faces Pain Scale: No hurt    Home Living                      Prior Function            PT Goals (current goals can now be found in the care plan section) Acute Rehab PT Goals Patient Stated Goal: pt wants to exercise at a gym after SNF stay PT Goal Formulation: With patient Time For Goal Achievement: 05/18/18 Potential to Achieve Goals: Fair Progress towards PT goals: Progressing toward goals    Frequency    Min 3X/week      PT Plan Current plan remains appropriate    Co-evaluation              AM-PAC PT "6 Clicks" Mobility   Outcome Measure  Help needed turning from your back to your side while in a flat bed without using bedrails?: A Little Help needed moving from lying on your back to sitting on the side of a flat bed without using bedrails?: None Help needed moving to and from a bed to a chair (including a  wheelchair)?: A Little Help needed standing up from a chair using your arms (e.g., wheelchair or bedside chair)?: Total Help needed to walk in hospital room?: Total Help needed climbing 3-5 steps with a railing? : Total 6 Click Score: 13    End of Session   Activity Tolerance: Patient tolerated treatment well Patient left: in chair;with call bell/phone within reach;with chair alarm set Nurse Communication: Mobility status;Precautions PT Visit Diagnosis: Other abnormalities of gait and mobility (R26.89)     Time: 5597-4163 PT Time Calculation (min) (ACUTE ONLY): 20 min  Charges:  $Therapeutic Exercise: 8-22 mins                     Blondell Reveal Kistler PT 05/17/2018  Acute Rehabilitation Services Pager [phone] Office [phone]

## 2018-05-17 NOTE — Progress Notes (Signed)
PROGRESS NOTE    Gina [name]  ENI:[id number] DOB: [birth year] DOA: 05/08/2018 PCP: Alfonse Spruce, FNP      Brief Narrative:  Gina [name] is a 58 y.o. F with DM, schizophrenia, HTN and recurrent foot amputations (and right BKA) who presented from her prosthetic clinic for redness of the foot and pain in setting of fever, chills.  Patient was found to have left lower extremity cellulitis, osteomyelitis of the left calcaneus.  Orthopedic surgery was consulted, underwent left below-knee amputation on 05/12/2018.  Patient was also noted to have hallucinations, concerning this psych was consulted, recommended to continue with perphenazine.  Patient was also found to have poorly controlled diabetes mellitus.  Oral medications were discontinued, started the patient on Lantus.  Plan to discharge patient to skilled nursing facility.    Assessment & Plan:  ##Sepsis from diabetic foot infection -Blood cultures no growth.    Source control achieved.    -Continue Augmentin 875-125 mg for 3 more days -Consult orthopedics, underwent below-knee amputation of left leg   ##Diabetes, poorly controlled, insulin dependent, with peripheral neuropathy -Get hemoglobin A1c-15 -Glucoses better -Increase Lantus 35 units -Continue SSI insulin -Hold metformin -Continue Januvia as formulary alternative  ##Hypertension - BP elevated -Continue amlodipine, lisinopril -Restart Lasix at d/c  ##Schizophrenia The patient is disorganized and tangential but not actively hallucinating nor agitated.  She was evaluated by psychiatry, she has mild auditory hallucinations which are chronic, no active mania or hallucinations.   Not currently on disease modifying therapy.    -Consult psychiatry -Continue new perphenazine/Trilafon -Continue SSRI -Will need referral to [facility] at d/c from SNF  ##AKI -Secondary to ATN Resolved.  ##Acute on chronic anemia of chronic disease Acute postoperative blood loss anemia -  Appropriate rise in Hgb post-trasnfusion on 05/12/2018.  Transfused 1 unit this hospitalization -Transfusion thershold 7 g/dL -Continue iron   ##Vaginal candidiasis Severe. -Continue fluconazole transition to 150 q72hrs for 3 more doses  ##Decision-making capacity -Appreciate psychiatry input, the patient does not have capacity to make logical ordered decisions about SNF placement  ##Severe protein calorie malnutrition -Patient was unable to care for herself -Continue with high-protein diet   MDM and disposition: The below labs and imaging reports were reviewed and summarized above.  Medication management as above.  The patient was admitted with DKA and cellulitis of the foot with sepsis.  Her right foot was nonviable and so orthopedic was consulted for amputation.  She underwent amputation on 6/14, uncomplicated.    At present we will continue oral antibiotics, patient placement.  She will require rehabilitation for adapting to limited mobility with now double leg amputation, as well as performing ADLs and frequent lab monitoring.            DVT prophylaxis: Heparin Code Status: FULL Family Communication: Patient    Consultants:   Psychiatry  Orthopedics  Procedures:   None  Antimicrobials:   Vancomycin 2/26 >>  Cefepime 2.26 >>    Subjective: Denies having any complaints.  Patient is having normal conversation     Objective: Vitals:   05/16/18 1558 05/16/18 2039 05/17/18 0515 05/17/18 1426  BP: 129/67 114/70 116/71 102/67  Pulse: 91 90 88 87  Resp: 16 18 18 17   Temp: 98.4 F (36.9 C) 98.5 F (36.9 C) 98.3 F (36.8 C)   TempSrc: Oral Oral Oral   SpO2: 100% 100% 98% 99%  Weight:      Height:        Intake/Output Summary (Last 24 hours)  at 05/17/2018 2007 Last data filed at 05/17/2018 1030 Gross per 24 hour  Intake 480 ml  Output 700 ml  Net -220 ml   Filed Weights   05/09/18 0041 05/10/18 1112 05/16/18 0527  Weight: 59.8 kg 59.8 kg 69.3 kg      Examination: General appearance: Female, sitting in bed, no acute distress, interactive HEENT: Anicteric, conjunctival pink, lids and lashes normal.  No nasal deformity, discharge, epistaxis.  Lips moist, edentulous.  Oropharynx moist, no oral lesions. Skin: Warm and dry.  no jaundice.  No suspicious rashes or lesions. Cardiac: Regular rate and rhythm, no murmurs, no lower extremity edema Respiratory: Respiratory effort is normal, no rales or wheezes. Abdomen: Soft without tenderness to palpation, rigidity, or rebound MSK: Bilateral BKA, stump was normal, shrinker on the right leg, wound VAC in place in the left Neuro: Wake and alert, moves upper extremities with normal strength and coordination, speech fluent. Psych: Oriented to person, place, and time.  Attention normal.  Affect normal.   Data Reviewed: I have personally reviewed following labs and imaging studies:  CBC: Recent Labs  Lab 05/12/18 0400 05/13/18 0330 05/14/18 0403 05/15/18 0354 05/16/18 0315  WBC 6.3 7.5 12.1* 9.3 8.3  HGB 7.2* 6.9* 8.6* 8.4* 8.8*  HCT 22.7* 22.4* 27.3* 27.1* 27.8*  MCV 94.2 95.3 92.2 94.1 94.2  PLT 259 291 350 388 188*   Basic Metabolic Panel: Recent Labs  Lab 05/13/18 0330 05/14/18 0403 05/15/18 0354 05/16/18 0315 05/17/18 0222  NA 135 132* 135 133* 135  K 4.2 4.4 4.6 4.6 4.8  CL 108 103 105 100 101  CO2 19* 22 24 28 28   GLUCOSE 205* 243* 227* 271* 187*  BUN 12 16 15 17 14   CREATININE 0.53 0.52 0.58 0.73 0.61  CALCIUM 7.7* 8.1* 8.0* 8.5* 8.7*   GFR: Estimated Creatinine Clearance: 70.8 mL/min (by C-G formula based on SCr of 0.61 mg/dL). Liver Function Tests: Recent Labs  Lab 05/11/18 0447 05/12/18 0400 05/13/18 0330 05/14/18 0403  AST 24 17 18 18   ALT 15 10 11 12   ALKPHOS 70 62 67 86  BILITOT 0.4 0.3 0.4 0.2*  PROT 6.2* 5.6* 5.5* 6.4*  ALBUMIN 1.8* 1.5* 1.6* 1.7*   No results for input(s): LIPASE, AMYLASE in the last 168 hours. No results for input(s): AMMONIA in  the last 168 hours. Coagulation Profile: No results for input(s): INR, PROTIME in the last 168 hours. Cardiac Enzymes: No results for input(s): CKTOTAL, CKMB, CKMBINDEX, TROPONINI in the last 168 hours. BNP (last 3 results) No results for input(s): PROBNP in the last 8760 hours. HbA1C: Recent Labs    05/16/18 0315  HGBA1C 15.0*   CBG: Recent Labs  Lab 05/16/18 1736 05/16/18 2218 05/17/18 0724 05/17/18 1210 05/17/18 1636  GLUCAP 94 166* 140* 141* 145*   Lipid Profile: No results for input(s): CHOL, HDL, LDLCALC, TRIG, CHOLHDL, LDLDIRECT in the last 72 hours. Thyroid Function Tests: No results for input(s): TSH, T4TOTAL, FREET4, T3FREE, THYROIDAB in the last 72 hours. Anemia Panel: No results for input(s): VITAMINB12, FOLATE, FERRITIN, TIBC, IRON, RETICCTPCT in the last 72 hours. Urine analysis:    Component Value Date/Time   COLORURINE YELLOW 05/09/2018 [place] 05/09/2018 0653   LABSPEC 1.018 05/09/2018 0653   PHURINE 5.0 05/09/2018 0653   GLUCOSEU >=500 (A) 05/09/2018 0653   HGBUR MODERATE (A) 05/09/2018 [place] NEGATIVE 05/09/2018 0653   BILIRUBINUR N 04/28/2016 1603   KETONESUR 80 (A) 05/09/2018 4166  PROTEINUR 30 (A) 05/09/2018 0653   UROBILINOGEN 0.2 04/28/2016 1603   UROBILINOGEN 0.2 06/18/2013 0650   NITRITE NEGATIVE 05/09/2018 0653   LEUKOCYTESUR NEGATIVE 05/09/2018 0653   Sepsis Labs: @LABRCNTIP (procalcitonin:4,lacticacidven:4)  ) Recent Results (from the past 240 hour(s))  Culture, blood (routine x 2)     Status: None   Collection Time: 05/08/18  9:48 PM  Result Value Ref Range Status   Specimen Description SITE NOT SPECIFIED  Final   Special Requests   Final    BOTTLES DRAWN AEROBIC AND ANAEROBIC Blood Culture results may not be optimal due to an excessive volume of blood received in culture bottles Performed at [facility] [address] [place], [place] [address]    Culture NO GROWTH 5 DAYS  Final   Report  Status 05/13/2018 FINAL  Final  Culture, blood (routine x 2)     Status: None   Collection Time: 05/08/18 10:11 PM  Result Value Ref Range Status   Specimen Description BLOOD RIGHT FOREARM  Final   Special Requests   Final    BOTTLES DRAWN AEROBIC AND ANAEROBIC Blood Culture results may not be optimal due to an inadequate volume of blood received in culture bottles Performed at [facility], [place] [address], [place], [place] [address]    Culture NO GROWTH 5 DAYS  Final   Report Status 05/13/2018 FINAL  Final  MRSA PCR Screening     Status: None   Collection Time: 05/09/18 12:50 AM  Result Value Ref Range Status   MRSA by PCR NEGATIVE NEGATIVE Final    Comment:        The GeneXpert MRSA Assay (FDA approved for NASAL specimens only), is one component of a comprehensive MRSA colonization surveillance program. It is not intended to diagnose MRSA infection nor to guide or monitor treatment for MRSA infections. Performed at [facility], [place] [address], [name], [place] [address]          Radiology Studies: No results found.      Scheduled Meds: . escitalopram  20 mg Oral Daily  . feeding supplement (ENSURE ENLIVE)  237 mL Oral BID BM  . fluconazole  150 mg Oral Q72H  . gabapentin  300 mg Oral QHS  . heparin  5,000 Units Subcutaneous Q8H  . insulin aspart  0-15 Units Subcutaneous TID WC  . insulin aspart  0-5 Units Subcutaneous QHS  . insulin glargine  35 Units Subcutaneous Daily  . linagliptin  5 mg Oral Daily  . perphenazine  2 mg Oral BID   Continuous Infusions: . lactated ringers 10 mL/hr at 05/10/18 1506     LOS: 9 days    Time spent: 25 minutes   Alija Riano, MD [facility] 05/17/2018, 8:07 PM     Please page through [place]:  [email or web address] Password [email or web address] If 7PM-7AM, please contact night-coverage

## 2018-05-17 NOTE — Plan of Care (Signed)
  Problem: Pain Managment: Goal: General experience of comfort will improve Outcome: Progressing   

## 2018-05-18 LAB — GLUCOSE, CAPILLARY
Glucose-Capillary: 70 mg/dL (ref 70–99)
Glucose-Capillary: 81 mg/dL (ref 70–99)
Glucose-Capillary: 61 mg/dL — ABNORMAL LOW (ref 70–99)
Glucose-Capillary: 102 mg/dL — ABNORMAL HIGH (ref 70–99)
Glucose-Capillary: 44 mg/dL — CL (ref 70–99)
Glucose-Capillary: 93 mg/dL (ref 70–99)
Glucose-Capillary: 93 mg/dL (ref 70–99)
Glucose-Capillary: 108 mg/dL — ABNORMAL HIGH (ref 70–99)

## 2018-05-18 MED ORDER — GLUCAGON HCL RDNA (DIAGNOSTIC) 1 MG IJ SOLR: 1.0000 mg | Freq: Once | INTRAMUSCULAR | Status: DC | PRN

## 2018-05-18 MED ORDER — INSULIN GLARGINE 100 UNIT/ML ~~LOC~~ SOLN
25.0000 [IU] | Freq: Every day | SUBCUTANEOUS | Status: DC
  Administered 2018-05-19 – 2018-05-21 (×3): 25 [IU] via SUBCUTANEOUS
  Filled 2018-05-18 (×3): qty 0.25

## 2018-05-18 MED ORDER — GLUCAGON HCL RDNA (DIAGNOSTIC) 1 MG IJ SOLR
INTRAMUSCULAR | Status: AC
  Administered 2018-05-18: 1 mg
  Filled 2018-05-18: qty 1

## 2018-05-18 MED ORDER — DEXTROSE 50 % IV SOLN
INTRAVENOUS | Status: AC
  Filled 2018-05-18: qty 50

## 2018-05-18 MED ORDER — DEXTROSE 50 % IV SOLN
INTRAVENOUS | Status: AC
  Administered 2018-05-18: 12.5 g via INTRAVENOUS
  Filled 2018-05-18: qty 50

## 2018-05-18 MED ORDER — DEXTROSE 50 % IV SOLN
12.5000 g | INTRAVENOUS | Status: DC | PRN
  Administered 2018-05-18: 12.5 g via INTRAVENOUS

## 2018-05-18 NOTE — Social Work (Addendum)
10:37am- at request of bedside RN met with pt. Explained we are still waiting on approval for SNF. Pt had questions regarding motorized wheelchair and Section 8 housing voucher. Pt will have to go through PCP for motorized chair and CSW unable to expedite Section 8 approval for voucher or reinstatement of this benefit. Pt requests a spiritual services consult. Will page chaplains.  9:14am- Continue to await PASRR number, QMHP assessment was completed by [place] [employer] representative. Pt cannot d/c without this.   , MSW, LCSWA [place] Clinical Social Work [phone]   

## 2018-05-18 NOTE — Progress Notes (Signed)
   05/18/18 1500  Clinical Encounter Type  Visited With Patient;Health care provider  Visit Type Initial;Social support;Spiritual support;Psychological support  Referral From Social work  Spiritual Encounters  Spiritual Needs Emotional;Prayer  Stress Factors  Patient Stress Factors Loss of control;Major life changes;Health changes;Financial concerns;Family relationships   Rcvd referral via pg from [place].  Lab RN was present when I entered and was present for first part of convo.  Pt very much loves God and her church.  Life review w/ pt. Encouraged pt to consider how she can take good care of herself to be well to resume the church ministries which mean so much to her.  Prayed w/ pt.  Myra Gianotti resident, [phone]

## 2018-05-18 NOTE — Plan of Care (Signed)
  Problem: Clinical Measurements: Goal: Respiratory complications will improve Outcome: Progressing   Problem: Clinical Measurements: Goal: Cardiovascular complication will be avoided Outcome: Progressing   Problem: Elimination: Goal: Will not experience complications related to bowel motility Outcome: Progressing   Problem: Safety: Goal: Ability to remain free from injury will improve Outcome: Progressing   Problem: Pain Managment: Goal: General experience of comfort will improve Outcome: Progressing   Problem: Safety: Goal: Ability to remain free from injury will improve Outcome: Progressing   Problem: Skin Integrity: Goal: Risk for impaired skin integrity will decrease Outcome: Progressing

## 2018-05-18 NOTE — Plan of Care (Signed)
  Problem: Education: Goal: Knowledge of General Education information will improve Description Including pain rating scale, medication(s)/side effects and non-pharmacologic comfort measures Outcome: Progressing   Problem: Pain Managment: Goal: General experience of comfort will improve Outcome: Progressing   Problem: Safety: Goal: Ability to remain free from injury will improve Outcome: Progressing   Problem: Glucose Imbalance Goal: Symptoms of hypoglycemia discussed Outcome: Progressing

## 2018-05-18 NOTE — Progress Notes (Signed)
Repeated CBG and documented. Pt remains asymptomatic. Ate 100% of breakfast and supplement shake.

## 2018-05-18 NOTE — Progress Notes (Signed)
CBG 61 and pt alert, responsive, and asymptomatic. Given 4 oz of juice and repeated CBG 60. Given IM glucagen and repeated CBG 102. MD notified and orders noted. Pt up and eating lunch with no complaints.

## 2018-05-18 NOTE — Progress Notes (Signed)
Notified MD of CBG result of 44. Pt speech was slurred during this time in presence of family. Unable to safely drink. Given dextrose IVP. Pt immediately improved. Speech returned to normal and pt alert and responsive. MD spoke to daughter and updated to on pt plan of care.

## 2018-05-18 NOTE — Progress Notes (Signed)
PROGRESS NOTE    Gina [name]  ACZ:[id number] DOB: 1960-12-23 DOA: 05/08/2018 PCP: Alfonse Spruce, FNP      Brief Narrative:  [name] is a 58 y.o. F with DM, schizophrenia, HTN and recurrent foot amputations (and right BKA) who presented from her prosthetic clinic for redness of the foot and pain in setting of fever, chills.  Patient was found to have left lower extremity cellulitis, osteomyelitis of the left calcaneus.  Orthopedic surgery was consulted, underwent left below-knee amputation on 05/12/2018.  Patient was also noted to have hallucinations, concerning this psych was consulted, recommended to continue with perphenazine.  Patient was also found to have poorly controlled diabetes mellitus.  Oral medications were discontinued, started the patient on Lantus.  Plan to discharge patient to skilled nursing facility.    Assessment & Plan:  ##Sepsis from diabetic foot infection -Blood cultures no growth.    Source control achieved.    -Continue Augmentin 875-125 mg for 3 more days -Consult orthopedics, underwent below-knee amputation of left leg   ##Diabetes, poorly controlled, insulin dependent, with peripheral neuropathy -Get hemoglobin A1c-15 -Continue SSI insulin -Hold metformin -Continue Januvia as formulary alternative -Patient is having low normal blood sugars, decrease Lantus to 25 units daily  ##Hypertension - BP elevated -Continue amlodipine, lisinopril -Restart Lasix at d/c -Improved  ##Schizophrenia The patient is disorganized and tangential but not actively hallucinating nor agitated.  She was evaluated by psychiatry, she has mild auditory hallucinations which are chronic, no active mania or hallucinations.   Not currently on disease modifying therapy.    -Consult psychiatry -Continue new perphenazine/Trilafon -Continue SSRI -Will need referral to [facility] at d/c from SNF  ##AKI -Secondary to ATN Resolved.  ##Acute on chronic anemia of chronic  disease Acute postoperative blood loss anemia - Appropriate rise in Hgb post-trasnfusion on 05/12/2018.  Transfused 1 unit this hospitalization -Transfusion thershold 7 g/dL -Continue iron   ##Vaginal candidiasis Severe. -improving  ##Decision-making capacity -Appreciate psychiatry input, the patient does not have capacity to make logical ordered decisions about SNF placement  ##Severe protein calorie malnutrition -Patient was unable to care for herself -Continue with high-protein diet   MDM and disposition: The below labs and imaging reports were reviewed and summarized above.  Medication management as above.  The patient was admitted with DKA and cellulitis of the foot with sepsis.  Her right foot was nonviable and so orthopedic was consulted for amputation.  She underwent amputation on 1/09, uncomplicated.    At present we will continue oral antibiotics, patient placement.  She will require rehabilitation for adapting to limited mobility with now double leg amputation, as well as performing ADLs and frequent lab monitoring.            DVT prophylaxis: Heparin Code Status: FULL Family Communication: Patient    Consultants:   Psychiatry  Orthopedics  Procedures:   None  Antimicrobials:   Vancomycin 2/26 >>  Cefepime 2.26 >>    Subjective: Per nursing staff patient had few episodes of hypoglycemia.   Denies any complaints.  Feeling better from the time of the admission Objective: Vitals:   05/16/18 2039 05/17/18 0515 05/17/18 1426 05/17/18 2130  BP: 114/70 116/71 102/67 123/66  Pulse: 90 88 87 81  Resp: 18 18 17 18   Temp: 98.5 F (36.9 C) 98.3 F (36.8 C)  98.1 F (36.7 C)  TempSrc: Oral Oral  Oral  SpO2: 100% 98% 99% 100%  Weight:      Height:  Intake/Output Summary (Last 24 hours) at 05/18/2018 1507 Last data filed at 05/18/2018 1500 Gross per 24 hour  Intake 720 ml  Output -  Net 720 ml   Filed Weights   05/09/18 0041 05/10/18 1112  05/16/18 0527  Weight: 59.8 kg 59.8 kg 69.3 kg    Examination: General appearance: Female, sitting in bed, no acute distress, interactive HEENT: Anicteric, conjunctival pink, lids and lashes normal.  No nasal deformity, discharge, epistaxis.  Lips moist, edentulous.  Oropharynx moist, no oral lesions. Skin: Warm and dry.  no jaundice.  No suspicious rashes or lesions. Cardiac: Regular rate and rhythm, no murmurs, no lower extremity edema Respiratory: Respiratory effort is normal, no rales or wheezes. Abdomen: Soft without tenderness to palpation, rigidity, or rebound MSK: Bilateral BKA, stump was normal, shrinker on the right leg, wound VAC in place in the left Neuro: Wake and alert, moves upper extremities with normal strength and coordination, speech fluent. Psych: Oriented to person, place, and time.  Attention normal.  Affect normal.   Data Reviewed: I have personally reviewed following labs and imaging studies:  CBC: Recent Labs  Lab 05/12/18 0400 05/13/18 0330 05/14/18 0403 05/15/18 0354 05/16/18 0315  WBC 6.3 7.5 12.1* 9.3 8.3  HGB 7.2* 6.9* 8.6* 8.4* 8.8*  HCT 22.7* 22.4* 27.3* 27.1* 27.8*  MCV 94.2 95.3 92.2 94.1 94.2  PLT 259 291 350 388 250*   Basic Metabolic Panel: Recent Labs  Lab 05/13/18 0330 05/14/18 0403 05/15/18 0354 05/16/18 0315 05/17/18 0222  NA 135 132* 135 133* 135  K 4.2 4.4 4.6 4.6 4.8  CL 108 103 105 100 101  CO2 19* 22 24 28 28   GLUCOSE 205* 243* 227* 271* 187*  BUN 12 16 15 17 14   CREATININE 0.53 0.52 0.58 0.73 0.61  CALCIUM 7.7* 8.1* 8.0* 8.5* 8.7*   GFR: Estimated Creatinine Clearance: 70.8 mL/min (by C-G formula based on SCr of 0.61 mg/dL). Liver Function Tests: Recent Labs  Lab 05/12/18 0400 05/13/18 0330 05/14/18 0403  AST 17 18 18   ALT 10 11 12   ALKPHOS 62 67 86  BILITOT 0.3 0.4 0.2*  PROT 5.6* 5.5* 6.4*  ALBUMIN 1.5* 1.6* 1.7*   No results for input(s): LIPASE, AMYLASE in the last 168 hours. No results for input(s):  AMMONIA in the last 168 hours. Coagulation Profile: No results for input(s): INR, PROTIME in the last 168 hours. Cardiac Enzymes: No results for input(s): CKTOTAL, CKMB, CKMBINDEX, TROPONINI in the last 168 hours. BNP (last 3 results) No results for input(s): PROBNP in the last 8760 hours. HbA1C: Recent Labs    05/16/18 0315  HGBA1C 15.0*   CBG: Recent Labs  Lab 05/17/18 2128 05/18/18 0734 05/18/18 0826 05/18/18 1217 05/18/18 1305  GLUCAP 98 70 81 61* 102*   Lipid Profile: No results for input(s): CHOL, HDL, LDLCALC, TRIG, CHOLHDL, LDLDIRECT in the last 72 hours. Thyroid Function Tests: No results for input(s): TSH, T4TOTAL, FREET4, T3FREE, THYROIDAB in the last 72 hours. Anemia Panel: No results for input(s): VITAMINB12, FOLATE, FERRITIN, TIBC, IRON, RETICCTPCT in the last 72 hours. Urine analysis:    Component Value Date/Time   COLORURINE YELLOW 05/09/2018 [place] 05/09/2018 0653   LABSPEC 1.018 05/09/2018 0653   PHURINE 5.0 05/09/2018 0653   GLUCOSEU >=500 (A) 05/09/2018 0653   HGBUR MODERATE (A) 05/09/2018 0653   BILIRUBINUR NEGATIVE 05/09/2018 0653   BILIRUBINUR N 04/28/2016 1603   KETONESUR 80 (A) 05/09/2018 0653   PROTEINUR 30 (A) 05/09/2018 5397  UROBILINOGEN 0.2 04/28/2016 1603   UROBILINOGEN 0.2 06/18/2013 0650   NITRITE NEGATIVE 05/09/2018 0653   LEUKOCYTESUR NEGATIVE 05/09/2018 0653   Sepsis Labs: @LABRCNTIP (procalcitonin:4,lacticacidven:4)  ) Recent Results (from the past 240 hour(s))  Culture, blood (routine x 2)     Status: None   Collection Time: 05/08/18  9:48 PM  Result Value Ref Range Status   Specimen Description SITE NOT SPECIFIED  Final   Special Requests   Final    BOTTLES DRAWN AEROBIC AND ANAEROBIC Blood Culture results may not be optimal due to an excessive volume of blood received in culture bottles Performed at [facility] [address] [place], [place] [address]    Culture NO GROWTH 5 DAYS  Final    Report Status 05/13/2018 FINAL  Final  Culture, blood (routine x 2)     Status: None   Collection Time: 05/08/18 10:11 PM  Result Value Ref Range Status   Specimen Description BLOOD RIGHT FOREARM  Final   Special Requests   Final    BOTTLES DRAWN AEROBIC AND ANAEROBIC Blood Culture results may not be optimal due to an inadequate volume of blood received in culture bottles Performed at [facility], [place] [address] [place], [place] [address]    Culture NO GROWTH 5 DAYS  Final   Report Status 05/13/2018 FINAL  Final  MRSA PCR Screening     Status: None   Collection Time: 05/09/18 12:50 AM  Result Value Ref Range Status   MRSA by PCR NEGATIVE NEGATIVE Final    Comment:        The GeneXpert MRSA Assay (FDA approved for NASAL specimens only), is one component of a comprehensive MRSA colonization surveillance program. It is not intended to diagnose MRSA infection nor to guide or monitor treatment for MRSA infections. Performed at [facility], [place] [address], [place], [place] [address]          Radiology Studies: No results found.      Scheduled Meds: . escitalopram  20 mg Oral Daily  . feeding supplement (ENSURE ENLIVE)  237 mL Oral BID BM  . fluconazole  150 mg Oral Q72H  . gabapentin  300 mg Oral QHS  . heparin  5,000 Units Subcutaneous Q8H  . insulin aspart  0-15 Units Subcutaneous TID WC  . insulin aspart  0-5 Units Subcutaneous QHS  . insulin glargine  35 Units Subcutaneous Daily  . linagliptin  5 mg Oral Daily  . perphenazine  2 mg Oral BID   Continuous Infusions: . lactated ringers 10 mL/hr at 05/10/18 1506     LOS: 10 days    Time spent: 25 minutes   Memory Heinrichs, MD [facility] 05/18/2018, 3:07 PM     Please page through [place]:  [email or web address] Password [email or web address] If 7PM-7AM, please contact night-coverage

## 2018-05-19 LAB — GLUCOSE, CAPILLARY
Glucose-Capillary: 240 mg/dL — ABNORMAL HIGH (ref 70–99)
Glucose-Capillary: 198 mg/dL — ABNORMAL HIGH (ref 70–99)
Glucose-Capillary: 146 mg/dL — ABNORMAL HIGH (ref 70–99)
Glucose-Capillary: 236 mg/dL — ABNORMAL HIGH (ref 70–99)

## 2018-05-19 NOTE — Progress Notes (Signed)
PROGRESS NOTE    Gina [name]  WUJ:[id number] DOB: 1960-08-27 DOA: 05/08/2018 PCP: Alfonse Spruce, FNP      Brief Narrative:  Gina [name] is a 58 y.o. F with DM, schizophrenia, HTN and recurrent foot amputations (and right BKA) who presented from her prosthetic clinic for redness of the foot and pain in setting of fever, chills.  Patient was found to have left lower extremity cellulitis, osteomyelitis of the left calcaneus.  Orthopedic surgery was consulted, underwent left below-knee amputation on 05/12/2018.  Patient was also noted to have hallucinations, concerning this psych was consulted, recommended to continue with perphenazine.  Patient was also found to have poorly controlled diabetes mellitus.  Oral medications were discontinued, started the patient on Lantus.  Plan to discharge patient to skilled nursing facility.    Assessment & Plan:  ##Sepsis from diabetic foot infection -Blood cultures no growth.    Source control achieved.    -Continue Augmentin 875-125 mg for 3 more days -Consult orthopedics, underwent below-knee amputation of left leg   ##Diabetes, poorly controlled, insulin dependent, with peripheral neuropathy -Get hemoglobin A1c-15 -Continue SSI insulin -Hold metformin -Continue Januvia as formulary alternative -Patient is having low normal blood sugars, decrease Lantus to 25 units daily  ##Hypoglycemia -Decrease the dose of Lantus to 25 units daily -Patient has no further episodes of hypoglycemia  ##Hypertension - BP elevated -Continue amlodipine, lisinopril -Restart Lasix at d/c -Improved  ##Schizophrenia The patient is disorganized and tangential but not actively hallucinating nor agitated.  She was evaluated by psychiatry, she has mild auditory hallucinations which are chronic, no active mania or hallucinations.   Not currently on disease modifying therapy.    -Consult psychiatry -Continue new perphenazine/Trilafon -Continue SSRI -Will need  referral to [facility] at d/c from SNF  ##AKI -Secondary to ATN Resolved.  ##Acute on chronic anemia of chronic disease Acute postoperative blood loss anemia - Appropriate rise in Hgb post-trasnfusion on 05/12/2018.  Transfused 1 unit this hospitalization -Transfusion thershold 7 g/dL -Continue iron   ##Vaginal candidiasis Severe. -improving  ##Decision-making capacity -Appreciate psychiatry input, the patient does not have capacity to make logical ordered decisions about SNF placement  ##Severe protein calorie malnutrition -Patient was unable to care for herself -Continue with high-protein diet   MDM and disposition: The below labs and imaging reports were reviewed and summarized above.  Medication management as above.  The patient was admitted with DKA and cellulitis of the foot with sepsis.  Her right foot was nonviable and so orthopedic was consulted for amputation.  She underwent amputation on 9/56, uncomplicated.    At present we will continue oral antibiotics, patient placement.  She will require rehabilitation for adapting to limited mobility with now double leg amputation, as well as performing ADLs and frequent lab monitoring.            DVT prophylaxis: Heparin Code Status: FULL Family Communication: Patient Discharge planning-pending placement    Consultants:   Psychiatry  Orthopedics  Procedures:   None  Antimicrobials:   Vancomycin 2/26 >>  Cefepime 2.26 >>    Subjective: Patient had multiple episodes of hypoglycemia yesterday.  Decrease the dose of insulin with improvement Objective: Vitals:   05/17/18 2130 05/18/18 1510 05/18/18 2220 05/19/18 0500  BP: 123/66 (!) 114/59 (!) 149/82 132/86  Pulse: 81 95 91 94  Resp: 18 16  14   Temp: 98.1 F (36.7 C) 98.2 F (36.8 C) 99.2 F (37.3 C) 99 F (37.2 C)  TempSrc: Oral Oral Oral Oral  SpO2: 100% 100% 100%   Weight:      Height:        Intake/Output Summary (Last 24 hours) at  05/19/2018 1319 Last data filed at 05/19/2018 0900 Gross per 24 hour  Intake 720 ml  Output -  Net 720 ml   Filed Weights   05/09/18 0041 05/10/18 1112 05/16/18 0527  Weight: 59.8 kg 59.8 kg 69.3 kg    Examination: General appearance: Female, sitting in bed, no acute distress, interactive HEENT: Anicteric, conjunctival pink, lids and lashes normal.  No nasal deformity, discharge, epistaxis.  Lips moist, edentulous.  Oropharynx moist, no oral lesions. Skin: Warm and dry.  no jaundice.  No suspicious rashes or lesions. Cardiac: Regular rate and rhythm, no murmurs, no lower extremity edema Respiratory: Respiratory effort is normal, no rales or wheezes. Abdomen: Soft without tenderness to palpation, rigidity, or rebound MSK: Bilateral BKA, stump was normal, shrinker on the right leg, wound VAC in place in the left Neuro: Wake and alert, moves upper extremities with normal strength and coordination, speech fluent. Psych: Oriented to person, place, and time.  Attention normal.  Affect normal.   Data Reviewed: I have personally reviewed following labs and imaging studies:  CBC: Recent Labs  Lab 05/13/18 0330 05/14/18 0403 05/15/18 0354 05/16/18 0315  WBC 7.5 12.1* 9.3 8.3  HGB 6.9* 8.6* 8.4* 8.8*  HCT 22.4* 27.3* 27.1* 27.8*  MCV 95.3 92.2 94.1 94.2  PLT 291 350 388 841*   Basic Metabolic Panel: Recent Labs  Lab 05/13/18 0330 05/14/18 0403 05/15/18 0354 05/16/18 0315 05/17/18 0222  NA 135 132* 135 133* 135  K 4.2 4.4 4.6 4.6 4.8  CL 108 103 105 100 101  CO2 19* 22 24 28 28   GLUCOSE 205* 243* 227* 271* 187*  BUN 12 16 15 17 14   CREATININE 0.53 0.52 0.58 0.73 0.61  CALCIUM 7.7* 8.1* 8.0* 8.5* 8.7*   GFR: Estimated Creatinine Clearance: 70.8 mL/min (by C-G formula based on SCr of 0.61 mg/dL). Liver Function Tests: Recent Labs  Lab 05/13/18 0330 05/14/18 0403  AST 18 18  ALT 11 12  ALKPHOS 67 86  BILITOT 0.4 0.2*  PROT 5.5* 6.4*  ALBUMIN 1.6* 1.7*   No results  for input(s): LIPASE, AMYLASE in the last 168 hours. No results for input(s): AMMONIA in the last 168 hours. Coagulation Profile: No results for input(s): INR, PROTIME in the last 168 hours. Cardiac Enzymes: No results for input(s): CKTOTAL, CKMB, CKMBINDEX, TROPONINI in the last 168 hours. BNP (last 3 results) No results for input(s): PROBNP in the last 8760 hours. HbA1C: No results for input(s): HGBA1C in the last 72 hours. CBG: Recent Labs  Lab 05/18/18 1646 05/18/18 1648 05/18/18 2219 05/19/18 0800 05/19/18 1149  GLUCAP 93 93 108* 240* 198*   Lipid Profile: No results for input(s): CHOL, HDL, LDLCALC, TRIG, CHOLHDL, LDLDIRECT in the last 72 hours. Thyroid Function Tests: No results for input(s): TSH, T4TOTAL, FREET4, T3FREE, THYROIDAB in the last 72 hours. Anemia Panel: No results for input(s): VITAMINB12, FOLATE, FERRITIN, TIBC, IRON, RETICCTPCT in the last 72 hours. Urine analysis:    Component Value Date/Time   COLORURINE YELLOW 05/09/2018 [place] 05/09/2018 0653   LABSPEC 1.018 05/09/2018 0653   PHURINE 5.0 05/09/2018 0653   GLUCOSEU >=500 (A) 05/09/2018 0653   HGBUR MODERATE (A) 05/09/2018 0653   BILIRUBINUR NEGATIVE 05/09/2018 0653   BILIRUBINUR N 04/28/2016 1603   KETONESUR 80 (A) 05/09/2018 0653   PROTEINUR 30 (  A) 05/09/2018 0653   UROBILINOGEN 0.2 04/28/2016 1603   UROBILINOGEN 0.2 06/18/2013 0650   NITRITE NEGATIVE 05/09/2018 0653   LEUKOCYTESUR NEGATIVE 05/09/2018 0653   Sepsis Labs: @LABRCNTIP (procalcitonin:4,lacticacidven:4)  ) No results found for this or any previous visit (from the past 240 hour(s)).       Radiology Studies: No results found.      Scheduled Meds: . escitalopram  20 mg Oral Daily  . feeding supplement (ENSURE ENLIVE)  237 mL Oral BID BM  . fluconazole  150 mg Oral Q72H  . gabapentin  300 mg Oral QHS  . heparin  5,000 Units Subcutaneous Q8H  . insulin aspart  0-15 Units Subcutaneous TID WC  .  insulin aspart  0-5 Units Subcutaneous QHS  . insulin glargine  25 Units Subcutaneous Daily  . linagliptin  5 mg Oral Daily  . perphenazine  2 mg Oral BID   Continuous Infusions: . lactated ringers 10 mL/hr at 05/10/18 1506     LOS: 11 days    Time spent: 25 minutes   Garnette Greb, MD [facility] 05/19/2018, 1:19 PM     Please page through [place]:  [email or web address] Password [email or web address] If 7PM-7AM, please contact night-coverage

## 2018-05-19 NOTE — Progress Notes (Signed)
RN was sitting at the nurses station outside of the patient's room. RN heard the patient yelling "Get out!!" "Get out!!".   RN walked into the room where the patient had a female visitor, identified by the patient as Sammy.   RN asked what was going on. Patient explained that she wanted the visitor to leave and was very upset that the visitor was making a mess by leaving food all over the floor. Patient also explained that the visitor was calling her mean names and that she couldn't take it anymore.   The patient and the visitor continued to argue back and forward  Patient's visitor attempted to explain to the RN his side of the story. However, RN deescalated the situation by escorting the visitor to main nurses station so that he could speak with the charge nurse.   Visitor was asked not to return to the unit for the remainder of the night so that the patient could get the rest she needed and to also keep the peace of the unit. Visitor agrees. Nursing will continue to monitor.

## 2018-05-19 NOTE — Plan of Care (Signed)
  Problem: Education: Goal: Knowledge of General Education information will improve Description Including pain rating scale, medication(s)/side effects and non-pharmacologic comfort measures Outcome: Progressing   Problem: Health Behavior/Discharge Planning: Goal: Ability to manage health-related needs will improve Outcome: Progressing   Problem: Clinical Measurements: Goal: Ability to maintain clinical measurements within normal limits will improve Outcome: Progressing Goal: Will remain free from infection Outcome: Progressing Goal: Diagnostic test results will improve Outcome: Progressing Goal: Respiratory complications will improve Outcome: Progressing Goal: Cardiovascular complication will be avoided Outcome: Progressing   Problem: Activity: Goal: Risk for activity intolerance will decrease Outcome: Progressing   Problem: Nutrition: Goal: Adequate nutrition will be maintained Outcome: Progressing   Problem: Coping: Goal: Level of anxiety will decrease Outcome: Progressing   Problem: Elimination: Goal: Will not experience complications related to bowel motility Outcome: Progressing Goal: Will not experience complications related to urinary retention Outcome: Progressing   Problem: Pain Managment: Goal: General experience of comfort will improve Outcome: Progressing   Problem: Safety: Goal: Ability to remain free from injury will improve Outcome: Progressing   Problem: Skin Integrity: Goal: Risk for impaired skin integrity will decrease Outcome: Progressing   Problem: Glucose Imbalance Goal: Symptoms of hypoglycemia discussed Outcome: Progressing

## 2018-05-20 ENCOUNTER — Other Ambulatory Visit: Payer: Self-pay | Admitting: Physician Assistant

## 2018-05-20 ENCOUNTER — Other Ambulatory Visit: Payer: Self-pay | Admitting: Family Medicine

## 2018-05-20 DIAGNOSIS — Z794 Long term (current) use of insulin: Principal | ICD-10-CM

## 2018-05-20 DIAGNOSIS — R609 Edema, unspecified: Secondary | ICD-10-CM

## 2018-05-20 DIAGNOSIS — E1165 Type 2 diabetes mellitus with hyperglycemia: Secondary | ICD-10-CM

## 2018-05-20 DIAGNOSIS — G629 Polyneuropathy, unspecified: Secondary | ICD-10-CM

## 2018-05-20 DIAGNOSIS — M86172 Other acute osteomyelitis, left ankle and foot: Secondary | ICD-10-CM

## 2018-05-20 LAB — GLUCOSE, CAPILLARY
Glucose-Capillary: 95 mg/dL (ref 70–99)
Glucose-Capillary: 155 mg/dL — ABNORMAL HIGH (ref 70–99)
Glucose-Capillary: 163 mg/dL — ABNORMAL HIGH (ref 70–99)
Glucose-Capillary: 139 mg/dL — ABNORMAL HIGH (ref 70–99)

## 2018-05-20 NOTE — Progress Notes (Signed)
PROGRESS NOTE    Gina [name]  ZOX:[id number] DOB: 1960-11-28 DOA: 05/08/2018 PCP: Alfonse Spruce, FNP      Brief Narrative:  Gina [name] is a 58 y.o. F with DM, schizophrenia, HTN and recurrent foot amputations (and right BKA) who presented from her prosthetic clinic for redness of the foot and pain in setting of fever, chills.  Patient was found to have left lower extremity cellulitis, osteomyelitis of the left calcaneus.  Orthopedic surgery was consulted, underwent left below-knee amputation on 05/12/2018.  Patient was also noted to have hallucinations, concerning this psych was consulted, recommended to continue with perphenazine.  Patient was also found to have poorly controlled diabetes mellitus.  Oral medications were discontinued, started the patient on Lantus.  Plan to discharge patient to skilled nursing facility.  Subjective: Hypoglycemic better.  Pt in good spirits. Looks like [facility] came back and SNF placement is being pursued. Needs labs, will order for tomorrow.   Assessment & Plan:  ##Sepsis from diabetic foot infection - Blood cultures no growth.    Source control achieved.    -Continue Augmentin 875-125 mg for 3 more days -Consult orthopedics, underwent below-knee amputation of left leg on 2/28 by Dr Sharol Given.    ##Schizophrenia The patient was disorganized and tangential but not actively hallucinating nor agitated.  She was evaluated by psychiatry, she has mild auditory hallucinations which were chronic, no active mania or hallucinations, they recommended starting patient on perphenazine (Trilafon). It was started on 05/11/18. Patient is stable.  -Continue new perphenazine/Trilafon -Continue SSRI -Will need referral to [facility] at d/c from SNF  ##Diabetes, poorly controlled, insulin dependent, with peripheral neuropathy -Get hemoglobin A1c-15 -Continue SSI insulin -Hold metformin -Continue Januvia as formulary alternative  -Patient is having low normal blood sugars,  decreased Lantus to 25 units daily  ##Hypoglycemia -Decreased the dose of Lantus to 25 units daily -Patient has no further episodes of hypoglycemia  ##Hypertension - BP's are not high, holding home lisinopril and lasix -Restart Lasix at d/c  ##AKI -Secondary to ATN Resolved.  ##Acute on chronic anemia of chronic disease Acute postoperative blood loss anemia - Appropriate rise in Hgb post-trasnfusion on 05/12/2018.  Transfused 1 unit this hospitalization -Transfusion thershold 7 g/dL -Continue iron   ##Vaginal candidiasis -improved, sp course of diflucan 2/26 - 05/15/18  ##Decision-making capacity -Appreciate psychiatry input, the patient does not have capacity to make logical ordered decisions about SNF placement  ##Severe protein calorie malnutrition -Patient was unable to care for herself -Continue with high-protein diet   DVT prophylaxis: Heparin Code Status: FULL Family Communication: awaiting [facility] for SNF placement  Rob Hayley Horn / [facility] [phone] 05/20/2018, 5:08 PM     Consultants:   Psychiatry  Orthopedics  Procedures:   None  Antimicrobials:   Vancomycin 2/26 >> 2/28  Cefepime 2.26 >> 2/28  Cefazolin dc'd 2/28     Objective: Vitals:   05/19/18 0500 05/19/18 1504 05/19/18 2057 05/20/18 0536  BP: 132/86 100/76 116/60 (!) 151/83  Pulse: 94 88 87 81  Resp: 14 16 16 20   Temp: 99 F (37.2 C) 99.1 F (37.3 C) 98.5 F (36.9 C) 98.4 F (36.9 C)  TempSrc: Oral Oral Oral Oral  SpO2:  98% 99% 100%  Weight:      Height:       No intake or output data in the 24 hours ending 05/20/18 1703 Filed Weights   05/09/18 0041 05/10/18 1112 05/16/18 0527  Weight: 59.8 kg 59.8 kg 69.3 kg    Examination:  General appearance: Female, sitting in bed, no acute distress, interactive HEENT: Anicteric, conjunctival pink, lids and lashes normal.  No nasal deformity, discharge, epistaxis.  Lips moist, edentulous.  Oropharynx moist, no oral lesions. Skin: Warm  and dry.  no jaundice.  No suspicious rashes or lesions. Cardiac: Regular rate and rhythm, no murmurs, no lower extremity edema Respiratory: Respiratory effort is normal, no rales or wheezes. Abdomen: Soft without tenderness to palpation, rigidity, or rebound MSK: Bilateral BKA, stump was normal, shrinker on the right leg, wound VAC in place in the left Neuro: Wake and alert, moves upper extremities with normal strength and coordination, speech fluent. Psych: Oriented to person, place, and time.  Attention normal.  Affect normal.   Data Reviewed: I have personally reviewed following labs and imaging studies:  CBC: Recent Labs  Lab 05/14/18 0403 05/15/18 0354 05/16/18 0315  WBC 12.1* 9.3 8.3  HGB 8.6* 8.4* 8.8*  HCT 27.3* 27.1* 27.8*  MCV 92.2 94.1 94.2  PLT 350 388 902*   Basic Metabolic Panel: Recent Labs  Lab 05/14/18 0403 05/15/18 0354 05/16/18 0315 05/17/18 0222  NA 132* 135 133* 135  K 4.4 4.6 4.6 4.8  CL 103 105 100 101  CO2 22 24 28 28   GLUCOSE 243* 227* 271* 187*  BUN 16 15 17 14   CREATININE 0.52 0.58 0.73 0.61  CALCIUM 8.1* 8.0* 8.5* 8.7*   GFR: Estimated Creatinine Clearance: 70.8 mL/min (by C-G formula based on SCr of 0.61 mg/dL). Liver Function Tests: Recent Labs  Lab 05/14/18 0403  AST 18  ALT 12  ALKPHOS 86  BILITOT 0.2*  PROT 6.4*  ALBUMIN 1.7*   No results for input(s): LIPASE, AMYLASE in the last 168 hours. No results for input(s): AMMONIA in the last 168 hours. Coagulation Profile: No results for input(s): INR, PROTIME in the last 168 hours. Cardiac Enzymes: No results for input(s): CKTOTAL, CKMB, CKMBINDEX, TROPONINI in the last 168 hours. BNP (last 3 results) No results for input(s): PROBNP in the last 8760 hours. HbA1C: No results for input(s): HGBA1C in the last 72 hours. CBG: Recent Labs  Lab 05/19/18 1149 05/19/18 1604 05/19/18 2229 05/20/18 0823 05/20/18 1130  GLUCAP 198* 146* 236* 95 155*   Lipid Profile: No results  for input(s): CHOL, HDL, LDLCALC, TRIG, CHOLHDL, LDLDIRECT in the last 72 hours. Thyroid Function Tests: No results for input(s): TSH, T4TOTAL, FREET4, T3FREE, THYROIDAB in the last 72 hours. Anemia Panel: No results for input(s): VITAMINB12, FOLATE, FERRITIN, TIBC, IRON, RETICCTPCT in the last 72 hours. Urine analysis:    Component Value Date/Time   COLORURINE YELLOW 05/09/2018 [place] 05/09/2018 0653   LABSPEC 1.018 05/09/2018 0653   PHURINE 5.0 05/09/2018 0653   GLUCOSEU >=500 (A) 05/09/2018 0653   HGBUR MODERATE (A) 05/09/2018 0653   BILIRUBINUR NEGATIVE 05/09/2018 0653   BILIRUBINUR N 04/28/2016 1603   KETONESUR 80 (A) 05/09/2018 0653   PROTEINUR 30 (A) 05/09/2018 0653   UROBILINOGEN 0.2 04/28/2016 1603   UROBILINOGEN 0.2 06/18/2013 0650   NITRITE NEGATIVE 05/09/2018 0653   LEUKOCYTESUR NEGATIVE 05/09/2018 0653   Sepsis Labs: @LABRCNTIP (procalcitonin:4,lacticacidven:4)  ) No results found for this or any previous visit (from the past 240 hour(s)).       Radiology Studies: No results found.      Scheduled Meds: . escitalopram  20 mg Oral Daily  . feeding supplement (ENSURE ENLIVE)  237 mL Oral BID BM  . gabapentin  300 mg Oral QHS  . heparin  5,000  Units Subcutaneous Q8H  . insulin aspart  0-15 Units Subcutaneous TID WC  . insulin aspart  0-5 Units Subcutaneous QHS  . insulin glargine  25 Units Subcutaneous Daily  . linagliptin  5 mg Oral Daily  . perphenazine  2 mg Oral BID   Continuous Infusions: . lactated ringers 10 mL/hr at 05/10/18 1506     LOS: 12 days     Please page through [place]:  [email or web address] Password [email or web address] If 7PM-7AM, please contact night-coverage

## 2018-05-20 NOTE — Progress Notes (Signed)
Physical Therapy Treatment Patient Details Name: Gina [name] MRN: [id number] DOB: Jun 18, 1960 Today's Date: 05/20/2018    History of Present Illness 58 yo admittted with DKA and sepsis with osteo of Left foot s/p BKA 2/28. PMhx: schizophrenia, Rt BKA, HTN, DM, PVD    PT Comments    Patient seen for mobility progression. Continue to progress as tolerated with anticipated d/c to SNF for further skilled PT services.     Follow Up Recommendations  SNF;Supervision/Assistance - 24 hour     Equipment Recommendations  Wheelchair (measurements PT)    Recommendations for Other Services       Precautions / Restrictions Precautions Precautions: Fall Precaution Comments: bil bKA, no pillow under left knee Required Braces or Orthoses: Other Brace(assisted to don shrinker) Restrictions Weight Bearing Restrictions: Yes LLE Weight Bearing: Non weight bearing    Mobility  Bed Mobility Overal bed mobility: Modified Independent Bed Mobility: Supine to Sit           General bed mobility comments: HOB elevated  Transfers Overall transfer level: Needs assistance   Transfers: Comptroller transfers: Min guard   General transfer comment: assist to make sure bed pad was flat under pt; increased time and effort due to c/o R shoulder pain  Ambulation/Gait             General Gait Details: unable   Stairs             Wheelchair Mobility    Modified Rankin (Stroke Patients Only)       Balance Overall balance assessment: Needs assistance   Sitting balance-Leahy Scale: Good                                      Cognition Arousal/Alertness: Awake/alert Behavior During Therapy: WFL for tasks assessed/performed Overall Cognitive Status: No family/caregiver present to determine baseline cognitive functioning Area of Impairment: Memory;Problem solving;Following commands;Safety/judgement                      Memory: Decreased short-term memory Following Commands: Follows one step commands consistently Safety/Judgement: Decreased awareness of deficits   Problem Solving: Slow processing General Comments: tangential and reports R shoulder pain but unable to express when pain began and/or reason for increased shoulder pain      Exercises      General Comments        Pertinent Vitals/Pain Pain Assessment: Faces Faces Pain Scale: No hurt Pain Intervention(s): Monitored during session    Home Living                      Prior Function            PT Goals (current goals can now be found in the care plan section) Progress towards PT goals: Progressing toward goals    Frequency    Min 3X/week      PT Plan Current plan remains appropriate    Co-evaluation              AM-PAC PT "6 Clicks" Mobility   Outcome Measure  Help needed turning from your back to your side while in a flat bed without using bedrails?: A Little Help needed moving from lying on your back to sitting on the side of a flat bed without using bedrails?: None Help needed moving to and from a bed  to a chair (including a wheelchair)?: A Little Help needed standing up from a chair using your arms (e.g., wheelchair or bedside chair)?: Total Help needed to walk in hospital room?: Total Help needed climbing 3-5 steps with a railing? : Total 6 Click Score: 13    End of Session   Activity Tolerance: Patient tolerated treatment well Patient left: in chair;with call bell/phone within reach Nurse Communication: Mobility status;Precautions PT Visit Diagnosis: Other abnormalities of gait and mobility (R26.89)     Time: 2947-6546 PT Time Calculation (min) (ACUTE ONLY): 32 min  Charges:  $Therapeutic Activity: 23-37 mins                     Earney Navy, PTA Acute Rehabilitation Services Pager: [phone] Office: [phone]     Darliss Cheney 05/20/2018, 1:08 PM

## 2018-05-20 NOTE — Plan of Care (Signed)
  Problem: Elimination: Goal: Will not experience complications related to bowel motility Outcome: Progressing Goal: Will not experience complications related to urinary retention Outcome: Progressing   

## 2018-05-20 NOTE — Progress Notes (Signed)
PASRR number received: [id number] F  Sent to [facility].   [place], [place]

## 2018-05-21 ENCOUNTER — Other Ambulatory Visit: Payer: Self-pay | Admitting: Family Medicine

## 2018-05-21 ENCOUNTER — Telehealth (INDEPENDENT_AMBULATORY_CARE_PROVIDER_SITE_OTHER): Payer: Self-pay | Admitting: Orthopedic Surgery

## 2018-05-21 DIAGNOSIS — T7491XA Unspecified adult maltreatment, confirmed, initial encounter: Secondary | ICD-10-CM

## 2018-05-21 DIAGNOSIS — E0810 Diabetes mellitus due to underlying condition with ketoacidosis without coma: Secondary | ICD-10-CM

## 2018-05-21 DIAGNOSIS — E1165 Type 2 diabetes mellitus with hyperglycemia: Secondary | ICD-10-CM

## 2018-05-21 DIAGNOSIS — R609 Edema, unspecified: Secondary | ICD-10-CM

## 2018-05-21 DIAGNOSIS — Z794 Long term (current) use of insulin: Secondary | ICD-10-CM

## 2018-05-21 LAB — BASIC METABOLIC PANEL
Anion gap: 8 (ref 5–15)
BUN: 18 mg/dL (ref 6–20)
CO2: 29 mmol/L (ref 22–32)
Calcium: 9.3 mg/dL (ref 8.9–10.3)
Chloride: 99 mmol/L (ref 98–111)
Creatinine, Ser: 0.78 mg/dL (ref 0.44–1.00)
GFR calc Af Amer: >60 mL/min (ref >60)
GFR calc non Af Amer: >60 mL/min (ref >60)
Glucose, Bld: 185 mg/dL — ABNORMAL HIGH (ref 70–99)
Potassium: 5 mmol/L (ref 3.5–5.1)
Sodium: 136 mmol/L (ref 135–145)

## 2018-05-21 LAB — CBC
HCT: 30.1 % — ABNORMAL LOW (ref 36.0–46.0)
Hemoglobin: 9.3 g/dL — ABNORMAL LOW (ref 12.0–15.0)
MCH: 29.6 pg (ref 26.0–34.0)
MCHC: 30.9 g/dL (ref 30.0–36.0)
MCV: 95.9 fL (ref 80.0–100.0)
Platelets: 506 10*3/uL — ABNORMAL HIGH (ref 150–400)
RBC: 3.14 MIL/uL — ABNORMAL LOW (ref 3.87–5.11)
RDW: 15.5 % (ref 11.5–15.5)
WBC: 8.4 10*3/uL (ref 4.0–10.5)
nRBC: 0 % (ref 0.0–0.2)

## 2018-05-21 LAB — GLUCOSE, CAPILLARY
Glucose-Capillary: 130 mg/dL — ABNORMAL HIGH (ref 70–99)
Glucose-Capillary: 175 mg/dL — ABNORMAL HIGH (ref 70–99)

## 2018-05-21 MED ORDER — ENSURE ENLIVE PO LIQD: 237.0000 mL | Freq: Two times a day (BID) | ORAL | 12 refills | Status: DC

## 2018-05-21 MED ORDER — PERPHENAZINE 2 MG PO TABS: 2.0000 mg | ORAL_TABLET | Freq: Two times a day (BID) | ORAL | Status: DC

## 2018-05-21 MED ORDER — ACETAMINOPHEN 325 MG PO TABS: 650.0000 mg | ORAL_TABLET | Freq: Four times a day (QID) | ORAL | Status: DC | PRN

## 2018-05-21 MED ORDER — DEXTROSE 50 % IV SOLN: 12.5000 g | INTRAVENOUS | Status: DC | PRN

## 2018-05-21 MED ORDER — INSULIN GLARGINE 100 UNIT/ML ~~LOC~~ SOLN: 25.0000 [IU] | Freq: Every day | SUBCUTANEOUS | 11 refills | Status: DC

## 2018-05-21 MED ORDER — INSULIN ASPART 100 UNIT/ML ~~LOC~~ SOLN: 0.0000 [IU] | Freq: Three times a day (TID) | SUBCUTANEOUS | 11 refills | Status: DC

## 2018-05-21 MED ORDER — LINAGLIPTIN 5 MG PO TABS: 5.0000 mg | ORAL_TABLET | Freq: Every day | ORAL | Status: DC

## 2018-05-21 MED ORDER — POLYETHYLENE GLYCOL 3350 17 G PO PACK: 17.0000 g | PACK | Freq: Every day | ORAL | 0 refills | Status: DC | PRN

## 2018-05-21 MED ORDER — SENNOSIDES-DOCUSATE SODIUM 8.6-50 MG PO TABS: 2.0000 | ORAL_TABLET | Freq: Every evening | ORAL | Status: DC | PRN

## 2018-05-21 NOTE — Anesthesia Postprocedure Evaluation (Signed)
Anesthesia Post Note  Patient: Gina [name]  Procedure(s) Performed: LEFT BELOW KNEE AMPUTATION (Left )     Patient location during evaluation: PACU Anesthesia Type: General Level of consciousness: awake and alert Pain management: pain level controlled Vital Signs Assessment: post-procedure vital signs reviewed and stable Respiratory status: spontaneous breathing, nonlabored ventilation, respiratory function stable and patient connected to nasal cannula oxygen Cardiovascular status: blood pressure returned to baseline and stable Postop Assessment: no apparent nausea or vomiting Anesthetic complications: no    Last Vitals:  Vitals:   05/20/18 2101 05/21/18 0300  BP: 108/65 110/66  Pulse: 81 80  Resp: 16 16  Temp: 36.9 C 36.8 C  SpO2: 100% 98%    Last Pain:  Vitals:   05/21/18 0300  TempSrc: Oral  PainSc:                  [place]

## 2018-05-21 NOTE — Progress Notes (Signed)
Patient will DC to:[facility] Anticipated DC date:05/21/2018 Family notified:Da[facility] Transport QP:EAKL  Per MD patient ready for DC to [facility] . RN, patient, patient's family, and facility notified of DC. Discharge Summary sent to facility. RN given number for report [phone] . DC packet on chart. Ambulance transport requested for patient.  CSW signing off.  [place], [place]

## 2018-05-21 NOTE — Telephone Encounter (Signed)
New Message  Dr. Braulio Conte verbalized there was a form faxed over to her at the health department but she has been gone for over a year but the patient's information and patient needs to establish care with [facility] and [facility].  Dr. Braulio Conte verbalized her concern as to why it sent to the Health Department.  I apologized and advised I'd send a note back stating where it should go.

## 2018-05-21 NOTE — Plan of Care (Signed)
  Problem: Pain Managment: Goal: General experience of comfort will improve Outcome: Progressing   

## 2018-05-21 NOTE — Clinical Social Work Placement (Signed)
   CLINICAL SOCIAL WORK PLACEMENT  NOTE  Date:  05/21/2018  Patient Details  Name: Gina [name] MRN: [id number] Date of Birth: 1960/08/04  Clinical Social Work is seeking post-discharge placement for this patient at the [place] [place] level of care (*CSW will initial, date and re-position this form in  chart as items are completed):  Yes   Patient/family provided with [place] Work Department's list of facilities offering this level of care within the geographic area requested by the patient (or if unable, by the patient's family).  Yes   Patient/family informed of their freedom to choose among providers that offer the needed level of care, that participate in Medicare, Medicaid or managed care program needed by the patient, have an available bed and are willing to accept the patient.      Patient/family informed of [facility]'s ownership interest in [facility] and [facility], as well as of the fact that they are under no obligation to receive care at these facilities.  PASRR submitted to EDS on       PASRR number received on 05/20/18     Existing PASRR number confirmed on       FL2 transmitted to all facilities in geographic area requested by pt/family on 05/20/18     FL2 transmitted to all facilities within larger geographic area on       Patient informed that his/her managed care company has contracts with or will negotiate with certain facilities, including the following:        Yes   Patient/family informed of bed offers received.  Patient chooses bed at Other - please specify in the comment section below:([facility])     Physician recommends and patient chooses bed at      Patient to be transferred to Other - please specify in the comment section below:([facility]) on 05/21/18.  Patient to be transferred to facility by [facility]     Patient family notified on 05/21/18 of transfer.  Name of family member notified:  Daquan      PHYSICIAN       Additional Comment:    _______________________________________________ Alberteen Sam, LCSW 05/21/2018, 1:33 PM
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
## 2018-05-21 NOTE — Telephone Encounter (Signed)
Can you please see message below? I did not fax anything to the health department and I am not sure what is going on with this message. If there is something I need to do please let me know otherwise can you let me know what form they are talking about and who sent it to [place].

## 2018-06-06 ENCOUNTER — Telehealth (INDEPENDENT_AMBULATORY_CARE_PROVIDER_SITE_OTHER): Payer: Self-pay | Admitting: Orthopedic Surgery

## 2018-06-06 NOTE — Telephone Encounter (Signed)
Can you call?

## 2018-06-06 NOTE — Telephone Encounter (Signed)
Facility was called per Dr Sharol Given request to inform RN nurse March Rummage) she can remove patients staples and if any questions or concerns they can call and update [place] at the office.

## 2018-06-06 NOTE — Telephone Encounter (Signed)
I did not fax anything
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
## 2018-06-13 ENCOUNTER — Telehealth (INDEPENDENT_AMBULATORY_CARE_PROVIDER_SITE_OTHER): Payer: Self-pay

## 2018-06-13 NOTE — Telephone Encounter (Signed)
Gina [name] with [facility] called wanted to let you know that the wound NP saw patient and wanted to change dressing orders from dry dressing to wound being cleaned with anasept silver algenate and wrap with kerlix.  She stated that patient was started on keflex yesterday. She reports that patient fell and bumped her leg on table and opened incision at the end of incision line.  She states patients staples had been removed but they used steri strips on it. Contact number for [name] is [phone]

## 2018-06-14 ENCOUNTER — Other Ambulatory Visit: Payer: Self-pay | Admitting: Family Medicine

## 2018-06-14 ENCOUNTER — Other Ambulatory Visit: Payer: Self-pay | Admitting: Physician Assistant

## 2018-06-14 ENCOUNTER — Telehealth (INDEPENDENT_AMBULATORY_CARE_PROVIDER_SITE_OTHER): Payer: Self-pay

## 2018-06-14 DIAGNOSIS — Z794 Long term (current) use of insulin: Secondary | ICD-10-CM

## 2018-06-14 DIAGNOSIS — E1165 Type 2 diabetes mellitus with hyperglycemia: Secondary | ICD-10-CM

## 2018-06-14 DIAGNOSIS — G629 Polyneuropathy, unspecified: Secondary | ICD-10-CM

## 2018-06-14 DIAGNOSIS — R609 Edema, unspecified: Secondary | ICD-10-CM

## 2018-06-14 NOTE — Telephone Encounter (Signed)
Spoke to [place] and advised her per PA stated that pt needs to be seen in our office, she states they are still on lock-down but will inform the staff of this. Pt nurse was given okay for dressing changes and PA aware of fall with incision with site open.

## 2018-06-21 ENCOUNTER — Other Ambulatory Visit: Payer: Self-pay | Admitting: Physician Assistant

## 2018-06-21 ENCOUNTER — Other Ambulatory Visit: Payer: Self-pay | Admitting: Family Medicine

## 2018-06-21 DIAGNOSIS — G629 Polyneuropathy, unspecified: Secondary | ICD-10-CM

## 2018-06-21 DIAGNOSIS — R609 Edema, unspecified: Secondary | ICD-10-CM

## 2018-06-21 DIAGNOSIS — E1165 Type 2 diabetes mellitus with hyperglycemia: Secondary | ICD-10-CM

## 2018-06-21 DIAGNOSIS — Z794 Long term (current) use of insulin: Secondary | ICD-10-CM

## 2018-06-22 NOTE — Telephone Encounter (Signed)
error 

## 2018-07-04 ENCOUNTER — Inpatient Hospital Stay (INDEPENDENT_AMBULATORY_CARE_PROVIDER_SITE_OTHER): Payer: Medicaid Other | Admitting: Orthopedic Surgery

## 2018-07-29 ENCOUNTER — Emergency Department (HOSPITAL_COMMUNITY): Payer: Medicaid Other

## 2018-07-29 ENCOUNTER — Other Ambulatory Visit: Payer: Self-pay

## 2018-07-29 ENCOUNTER — Encounter (HOSPITAL_COMMUNITY): Payer: Self-pay

## 2018-07-29 ENCOUNTER — Emergency Department (HOSPITAL_COMMUNITY)
Admission: EM | Admit: 2018-07-29 | Discharge: 2018-07-29 | Disposition: A | Discharge status: Home / Self Care | Payer: Medicaid Other | Attending: Emergency Medicine | Admitting: Emergency Medicine

## 2018-07-29 DIAGNOSIS — Z79899 Other long term (current) drug therapy: Secondary | ICD-10-CM | POA: Insufficient documentation

## 2018-07-29 DIAGNOSIS — Z89512 Acquired absence of left leg below knee: Secondary | ICD-10-CM | POA: Insufficient documentation

## 2018-07-29 DIAGNOSIS — I10 Essential (primary) hypertension: Secondary | ICD-10-CM | POA: Diagnosis not present

## 2018-07-29 DIAGNOSIS — E1159 Type 2 diabetes mellitus with other circulatory complications: Secondary | ICD-10-CM | POA: Diagnosis not present

## 2018-07-29 DIAGNOSIS — Z89511 Acquired absence of right leg below knee: Secondary | ICD-10-CM | POA: Diagnosis not present

## 2018-07-29 DIAGNOSIS — Z794 Long term (current) use of insulin: Secondary | ICD-10-CM | POA: Diagnosis not present

## 2018-07-29 DIAGNOSIS — M545 Low back pain, unspecified: Secondary | ICD-10-CM

## 2018-07-29 DIAGNOSIS — W19XXXA Unspecified fall, initial encounter: Secondary | ICD-10-CM

## 2018-07-29 LAB — CBC WITH DIFFERENTIAL/PLATELET
Abs Immature Granulocytes: 0.02 10*3/uL (ref 0.00–0.07)
Basophils Absolute: 0 10*3/uL (ref 0.0–0.1)
Basophils Relative: 0 %
Eosinophils Absolute: 0.1 10*3/uL (ref 0.0–0.5)
Eosinophils Relative: 1 %
HCT: 38.4 % (ref 36.0–46.0)
Hemoglobin: 12.8 g/dL (ref 12.0–15.0)
Immature Granulocytes: 0 %
Lymphocytes Relative: 42 %
Lymphs Abs: 2.6 10*3/uL (ref 0.7–4.0)
MCH: 31 pg (ref 26.0–34.0)
MCHC: 33.3 g/dL (ref 30.0–36.0)
MCV: 93 fL (ref 80.0–100.0)
Monocytes Absolute: 0.5 10*3/uL (ref 0.1–1.0)
Monocytes Relative: 8 %
Neutro Abs: 2.9 10*3/uL (ref 1.7–7.7)
Neutrophils Relative %: 49 %
Platelets: 170 10*3/uL (ref 150–400)
RBC: 4.13 MIL/uL (ref 3.87–5.11)
RDW: 12.4 % (ref 11.5–15.5)
WBC: 6.1 10*3/uL (ref 4.0–10.5)
nRBC: 0 % (ref 0.0–0.2)

## 2018-07-29 LAB — BASIC METABOLIC PANEL
Anion gap: 12 (ref 5–15)
BUN: 22 mg/dL — ABNORMAL HIGH (ref 6–20)
CO2: 25 mmol/L (ref 22–32)
Calcium: 9.9 mg/dL (ref 8.9–10.3)
Chloride: 100 mmol/L (ref 98–111)
Creatinine, Ser: 0.74 mg/dL (ref 0.44–1.00)
GFR calc Af Amer: >60 mL/min (ref >60)
GFR calc non Af Amer: >60 mL/min (ref >60)
Glucose, Bld: 329 mg/dL — ABNORMAL HIGH (ref 70–99)
Potassium: 4.2 mmol/L (ref 3.5–5.1)
Sodium: 137 mmol/L (ref 135–145)

## 2018-07-29 LAB — CBG MONITORING, ED: Glucose-Capillary: 332 mg/dL — ABNORMAL HIGH (ref 70–99)

## 2018-07-29 MED ORDER — LISINOPRIL 10 MG PO TABS
10.0000 mg | ORAL_TABLET | Freq: Once | ORAL | Status: AC
  Administered 2018-07-29: 10 mg via ORAL
  Filled 2018-07-29: qty 1

## 2018-07-29 NOTE — ED Notes (Signed)
Pure wick has been placed. Pt has been educated on pure wick. Suction set to 65mmHg

## 2018-07-29 NOTE — ED Notes (Signed)
Bed: WA22 Expected date:  Expected time:  Means of arrival:  Comments: EMS 
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
## 2018-07-29 NOTE — ED Notes (Signed)
Pt was soiled on arrival. Pt has been changed and cleaned. Pure wick has also been placed. Bed bath given

## 2018-07-29 NOTE — Discharge Instructions (Addendum)
Thank you for allowing me to care for you today. Please return to the emergency department if you have new or worsening symptoms. Take your medications as instructed.  ° °

## 2018-07-29 NOTE — ED Notes (Addendum)
[facility] called Pt provided a meal. Pt request SW to discuss home care options.
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
## 2018-08-21 ENCOUNTER — Ambulatory Visit: Payer: Medicaid Other | Admitting: Family Medicine

## 2018-08-30 ENCOUNTER — Ambulatory Visit: Payer: Medicaid Other | Admitting: Family Medicine

## 2018-09-04 ENCOUNTER — Ambulatory Visit: Payer: Medicaid Other | Admitting: Family

## 2018-10-07 ENCOUNTER — Ambulatory Visit: Payer: Medicaid Other | Admitting: Orthopedic Surgery

## 2018-10-29 ENCOUNTER — Ambulatory Visit: Payer: Medicaid Other | Admitting: Orthopedic Surgery

## 2018-11-24 ENCOUNTER — Inpatient Hospital Stay (HOSPITAL_COMMUNITY)
Admission: EM | Admit: 2018-11-24 | Discharge: 2018-11-28 | DRG: 638 | Disposition: A | Discharge status: Home Health | Payer: Medicare Other | Attending: Internal Medicine | Admitting: Internal Medicine

## 2018-11-24 ENCOUNTER — Other Ambulatory Visit: Payer: Self-pay

## 2018-11-24 DIAGNOSIS — T7491XA Unspecified adult maltreatment, confirmed, initial encounter: Secondary | ICD-10-CM | POA: Diagnosis present

## 2018-11-24 DIAGNOSIS — F419 Anxiety disorder, unspecified: Secondary | ICD-10-CM | POA: Diagnosis present

## 2018-11-24 DIAGNOSIS — E1159 Type 2 diabetes mellitus with other circulatory complications: Secondary | ICD-10-CM

## 2018-11-24 DIAGNOSIS — R4182 Altered mental status, unspecified: Secondary | ICD-10-CM

## 2018-11-24 DIAGNOSIS — E861 Hypovolemia: Secondary | ICD-10-CM | POA: Diagnosis present

## 2018-11-24 DIAGNOSIS — L8990 Pressure ulcer of unspecified site, unspecified stage: Secondary | ICD-10-CM | POA: Insufficient documentation

## 2018-11-24 DIAGNOSIS — E119 Type 2 diabetes mellitus without complications: Secondary | ICD-10-CM

## 2018-11-24 DIAGNOSIS — I10 Essential (primary) hypertension: Secondary | ICD-10-CM | POA: Diagnosis present

## 2018-11-24 DIAGNOSIS — Z8249 Family history of ischemic heart disease and other diseases of the circulatory system: Secondary | ICD-10-CM

## 2018-11-24 DIAGNOSIS — Z888 Allergy status to other drugs, medicaments and biological substances status: Secondary | ICD-10-CM

## 2018-11-24 DIAGNOSIS — F209 Schizophrenia, unspecified: Secondary | ICD-10-CM | POA: Diagnosis present

## 2018-11-24 DIAGNOSIS — Z833 Family history of diabetes mellitus: Secondary | ICD-10-CM

## 2018-11-24 DIAGNOSIS — Z794 Long term (current) use of insulin: Secondary | ICD-10-CM

## 2018-11-24 DIAGNOSIS — F32A Depression, unspecified: Secondary | ICD-10-CM | POA: Diagnosis present

## 2018-11-24 DIAGNOSIS — L89152 Pressure ulcer of sacral region, stage 2: Secondary | ICD-10-CM | POA: Diagnosis present

## 2018-11-24 DIAGNOSIS — E1100 Type 2 diabetes mellitus with hyperosmolarity without nonketotic hyperglycemic-hyperosmolar coma (NKHHC): Secondary | ICD-10-CM | POA: Diagnosis present

## 2018-11-24 DIAGNOSIS — Z89512 Acquired absence of left leg below knee: Secondary | ICD-10-CM

## 2018-11-24 DIAGNOSIS — E860 Dehydration: Secondary | ICD-10-CM | POA: Diagnosis present

## 2018-11-24 DIAGNOSIS — F329 Major depressive disorder, single episode, unspecified: Secondary | ICD-10-CM | POA: Diagnosis present

## 2018-11-24 DIAGNOSIS — L89159 Pressure ulcer of sacral region, unspecified stage: Secondary | ICD-10-CM | POA: Insufficient documentation

## 2018-11-24 DIAGNOSIS — Z89511 Acquired absence of right leg below knee: Secondary | ICD-10-CM

## 2018-11-24 DIAGNOSIS — N179 Acute kidney failure, unspecified: Secondary | ICD-10-CM | POA: Diagnosis present

## 2018-11-24 DIAGNOSIS — E871 Hypo-osmolality and hyponatremia: Secondary | ICD-10-CM | POA: Diagnosis present

## 2018-11-24 DIAGNOSIS — Z8042 Family history of malignant neoplasm of prostate: Secondary | ICD-10-CM

## 2018-11-24 DIAGNOSIS — R739 Hyperglycemia, unspecified: Secondary | ICD-10-CM | POA: Diagnosis present

## 2018-11-24 LAB — CBC WITH DIFFERENTIAL/PLATELET
Abs Immature Granulocytes: 0.03 10*3/uL (ref 0.00–0.07)
Basophils Absolute: 0 10*3/uL (ref 0.0–0.1)
Basophils Relative: 0 %
Eosinophils Absolute: 0 10*3/uL (ref 0.0–0.5)
Eosinophils Relative: 0 %
HCT: 35 % — ABNORMAL LOW (ref 36.0–46.0)
Hemoglobin: 12 g/dL (ref 12.0–15.0)
Immature Granulocytes: 0 %
Lymphocytes Relative: 37 %
Lymphs Abs: 2.7 10*3/uL (ref 0.7–4.0)
MCH: 31.6 pg (ref 26.0–34.0)
MCHC: 34.3 g/dL (ref 30.0–36.0)
MCV: 92.1 fL (ref 80.0–100.0)
Monocytes Absolute: 0.7 10*3/uL (ref 0.1–1.0)
Monocytes Relative: 10 %
Neutro Abs: 3.7 10*3/uL (ref 1.7–7.7)
Neutrophils Relative %: 53 %
Platelets: 233 10*3/uL (ref 150–400)
RBC: 3.8 MIL/uL — ABNORMAL LOW (ref 3.87–5.11)
RDW: 12.1 % (ref 11.5–15.5)
WBC: 7.2 10*3/uL (ref 4.0–10.5)
nRBC: 0 % (ref 0.0–0.2)

## 2018-11-24 LAB — COMPREHENSIVE METABOLIC PANEL
ALT: 32 U/L (ref 0–44)
AST: 37 U/L (ref 15–41)
Albumin: 3.1 g/dL — ABNORMAL LOW (ref 3.5–5.0)
Alkaline Phosphatase: 92 U/L (ref 38–126)
Anion gap: 15 (ref 5–15)
BUN: 27 mg/dL — ABNORMAL HIGH (ref 6–20)
CO2: 23 mmol/L (ref 22–32)
Calcium: 9.2 mg/dL (ref 8.9–10.3)
Chloride: 83 mmol/L — ABNORMAL LOW (ref 98–111)
Creatinine, Ser: 1.03 mg/dL — ABNORMAL HIGH (ref 0.44–1.00)
GFR calc Af Amer: >60 mL/min (ref >60)
GFR calc non Af Amer: >60 mL/min (ref >60)
Glucose, Bld: 722 mg/dL (ref 70–99)
Potassium: 4.3 mmol/L (ref 3.5–5.1)
Sodium: 121 mmol/L — ABNORMAL LOW (ref 135–145)
Total Bilirubin: 0.6 mg/dL (ref 0.3–1.2)
Total Protein: 6.9 g/dL (ref 6.5–8.1)

## 2018-11-24 LAB — CBG MONITORING, ED: Glucose-Capillary: >600 mg/dL (ref 70–99)

## 2018-11-24 MED ORDER — SODIUM CHLORIDE 0.9 % IV BOLUS
1000.0000 mL | Freq: Once | INTRAVENOUS | Status: AC
  Administered 2018-11-24: 1000 mL via INTRAVENOUS
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
## 2018-11-24 NOTE — ED Notes (Signed)
Pt on call with TTS 

## 2018-11-24 NOTE — ED Notes (Signed)
CRITICAL VALUE STICKER  CRITICAL VALUE: Glucose 722  RECEIVER (on-site recipient of call): Aniesa Boback RN   [place] NOTIFIED: [id number]  MESSENGER (representative from lab):  MD NOTIFIED:  Mendel Ryder PA   [place]: [phone]
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
## 2018-11-25 ENCOUNTER — Encounter (HOSPITAL_COMMUNITY): Payer: Self-pay

## 2018-11-25 ENCOUNTER — Other Ambulatory Visit: Payer: Self-pay

## 2018-11-25 DIAGNOSIS — T7491XA Unspecified adult maltreatment, confirmed, initial encounter: Secondary | ICD-10-CM

## 2018-11-25 DIAGNOSIS — I10 Essential (primary) hypertension: Secondary | ICD-10-CM

## 2018-11-25 DIAGNOSIS — N179 Acute kidney failure, unspecified: Secondary | ICD-10-CM | POA: Diagnosis present

## 2018-11-25 DIAGNOSIS — E861 Hypovolemia: Secondary | ICD-10-CM | POA: Diagnosis present

## 2018-11-25 DIAGNOSIS — Z8249 Family history of ischemic heart disease and other diseases of the circulatory system: Secondary | ICD-10-CM | POA: Diagnosis not present

## 2018-11-25 DIAGNOSIS — F329 Major depressive disorder, single episode, unspecified: Secondary | ICD-10-CM | POA: Diagnosis present

## 2018-11-25 DIAGNOSIS — E1100 Type 2 diabetes mellitus with hyperosmolarity without nonketotic hyperglycemic-hyperosmolar coma (NKHHC): Secondary | ICD-10-CM | POA: Diagnosis present

## 2018-11-25 DIAGNOSIS — F320 Major depressive disorder, single episode, mild: Secondary | ICD-10-CM | POA: Diagnosis not present

## 2018-11-25 DIAGNOSIS — Z888 Allergy status to other drugs, medicaments and biological substances status: Secondary | ICD-10-CM | POA: Diagnosis not present

## 2018-11-25 DIAGNOSIS — L89152 Pressure ulcer of sacral region, stage 2: Secondary | ICD-10-CM | POA: Diagnosis present

## 2018-11-25 DIAGNOSIS — F209 Schizophrenia, unspecified: Secondary | ICD-10-CM | POA: Diagnosis present

## 2018-11-25 DIAGNOSIS — Z8042 Family history of malignant neoplasm of prostate: Secondary | ICD-10-CM | POA: Diagnosis not present

## 2018-11-25 DIAGNOSIS — E860 Dehydration: Secondary | ICD-10-CM | POA: Diagnosis present

## 2018-11-25 DIAGNOSIS — Z89512 Acquired absence of left leg below knee: Secondary | ICD-10-CM | POA: Diagnosis not present

## 2018-11-25 DIAGNOSIS — Z794 Long term (current) use of insulin: Secondary | ICD-10-CM | POA: Diagnosis not present

## 2018-11-25 DIAGNOSIS — E871 Hypo-osmolality and hyponatremia: Secondary | ICD-10-CM | POA: Diagnosis present

## 2018-11-25 DIAGNOSIS — R739 Hyperglycemia, unspecified: Secondary | ICD-10-CM | POA: Diagnosis not present

## 2018-11-25 DIAGNOSIS — Z833 Family history of diabetes mellitus: Secondary | ICD-10-CM | POA: Diagnosis not present

## 2018-11-25 DIAGNOSIS — R4182 Altered mental status, unspecified: Secondary | ICD-10-CM | POA: Diagnosis not present

## 2018-11-25 DIAGNOSIS — Z89511 Acquired absence of right leg below knee: Secondary | ICD-10-CM | POA: Diagnosis not present

## 2018-11-25 DIAGNOSIS — F419 Anxiety disorder, unspecified: Secondary | ICD-10-CM | POA: Diagnosis present

## 2018-11-25 LAB — BASIC METABOLIC PANEL
Anion gap: 14 (ref 5–15)
Anion gap: 7 (ref 5–15)
BUN: 33 mg/dL — ABNORMAL HIGH (ref 6–20)
BUN: 34 mg/dL — ABNORMAL HIGH (ref 6–20)
CO2: 24 mmol/L (ref 22–32)
CO2: 26 mmol/L (ref 22–32)
Calcium: 9 mg/dL (ref 8.9–10.3)
Calcium: 8.5 mg/dL — ABNORMAL LOW (ref 8.9–10.3)
Chloride: 88 mmol/L — ABNORMAL LOW (ref 98–111)
Chloride: 94 mmol/L — ABNORMAL LOW (ref 98–111)
Creatinine, Ser: 1.24 mg/dL — ABNORMAL HIGH (ref 0.44–1.00)
Creatinine, Ser: 0.83 mg/dL (ref 0.44–1.00)
GFR calc Af Amer: 56 mL/min — ABNORMAL LOW (ref >60)
GFR calc Af Amer: >60 mL/min (ref >60)
GFR calc non Af Amer: 48 mL/min — ABNORMAL LOW (ref >60)
GFR calc non Af Amer: >60 mL/min (ref >60)
Glucose, Bld: 709 mg/dL (ref 70–99)
Glucose, Bld: 423 mg/dL — ABNORMAL HIGH (ref 70–99)
Potassium: 3.8 mmol/L (ref 3.5–5.1)
Potassium: 4 mmol/L (ref 3.5–5.1)
Sodium: 126 mmol/L — ABNORMAL LOW (ref 135–145)
Sodium: 127 mmol/L — ABNORMAL LOW (ref 135–145)

## 2018-11-25 LAB — CBC
HCT: 30.2 % — ABNORMAL LOW (ref 36.0–46.0)
Hemoglobin: 9.9 g/dL — ABNORMAL LOW (ref 12.0–15.0)
MCH: 30.8 pg (ref 26.0–34.0)
MCHC: 32.8 g/dL (ref 30.0–36.0)
MCV: 94.1 fL (ref 80.0–100.0)
Platelets: 181 10*3/uL (ref 150–400)
RBC: 3.21 MIL/uL — ABNORMAL LOW (ref 3.87–5.11)
RDW: 12.2 % (ref 11.5–15.5)
WBC: 6.8 10*3/uL (ref 4.0–10.5)
nRBC: 0 % (ref 0.0–0.2)

## 2018-11-25 LAB — BLOOD GAS, VENOUS
Acid-Base Excess: 2.7 mmol/L — ABNORMAL HIGH (ref 0.0–2.0)
Bicarbonate: 26.1 mmol/L (ref 20.0–28.0)
Drawn by: 514251
FIO2: 21
O2 Saturation: 79.5 %
Patient temperature: 98.6
pCO2, Ven: 37 mmHg — ABNORMAL LOW (ref 44.0–60.0)
pH, Ven: 7.462 — ABNORMAL HIGH (ref 7.250–7.430)
pO2, Ven: 46.1 mmHg — ABNORMAL HIGH (ref 32.0–45.0)

## 2018-11-25 LAB — CBG MONITORING, ED
Glucose-Capillary: 599 mg/dL (ref 70–99)
Glucose-Capillary: 372 mg/dL — ABNORMAL HIGH (ref 70–99)
Glucose-Capillary: 265 mg/dL — ABNORMAL HIGH (ref 70–99)
Glucose-Capillary: 224 mg/dL — ABNORMAL HIGH (ref 70–99)
Glucose-Capillary: 67 mg/dL — ABNORMAL LOW (ref 70–99)
Glucose-Capillary: 102 mg/dL — ABNORMAL HIGH (ref 70–99)
Glucose-Capillary: 331 mg/dL — ABNORMAL HIGH (ref 70–99)
Glucose-Capillary: 451 mg/dL — ABNORMAL HIGH (ref 70–99)
Glucose-Capillary: 185 mg/dL — ABNORMAL HIGH (ref 70–99)

## 2018-11-25 LAB — GLUCOSE, CAPILLARY
Glucose-Capillary: 117 mg/dL — ABNORMAL HIGH (ref 70–99)
Glucose-Capillary: 520 mg/dL (ref 70–99)

## 2018-11-25 LAB — I-STAT BETA HCG BLOOD, ED (MC, WL, AP ONLY): I-stat hCG, quantitative: 15.1 m[IU]/mL — ABNORMAL HIGH (ref <5)

## 2018-11-25 MED ORDER — INSULIN ASPART 100 UNIT/ML ~~LOC~~ SOLN
0.0000 [IU] | Freq: Every day | SUBCUTANEOUS | Status: DC
  Administered 2018-11-26: 2 [IU] via SUBCUTANEOUS

## 2018-11-25 MED ORDER — ADULT MULTIVITAMIN W/MINERALS CH
1.0000 | ORAL_TABLET | Freq: Every day | ORAL | Status: DC
  Administered 2018-11-25 – 2018-11-28 (×4): 1 via ORAL
  Filled 2018-11-25 (×4): qty 1

## 2018-11-25 MED ORDER — DEXTROSE 50 % IV SOLN
INTRAVENOUS | Status: AC
  Administered 2018-11-25: 13 mL
  Filled 2018-11-25: qty 50

## 2018-11-25 MED ORDER — INSULIN ASPART 100 UNIT/ML ~~LOC~~ SOLN
0.0000 [IU] | Freq: Three times a day (TID) | SUBCUTANEOUS | Status: DC
  Administered 2018-11-26 – 2018-11-27 (×3): 2 [IU] via SUBCUTANEOUS
  Administered 2018-11-27: 1 [IU] via SUBCUTANEOUS
  Administered 2018-11-27: 2 [IU] via SUBCUTANEOUS
  Administered 2018-11-28 (×2): 3 [IU] via SUBCUTANEOUS

## 2018-11-25 MED ORDER — INSULIN GLARGINE 100 UNIT/ML ~~LOC~~ SOLN
10.0000 [IU] | Freq: Every day | SUBCUTANEOUS | Status: DC
  Administered 2018-11-25: 10 [IU] via SUBCUTANEOUS
  Filled 2018-11-25: qty 0.1

## 2018-11-25 MED ORDER — INSULIN REGULAR(HUMAN) IN NACL 100-0.9 UT/100ML-% IV SOLN
INTRAVENOUS | Status: DC
  Administered 2018-11-25: 5.4 [IU]/h via INTRAVENOUS
  Filled 2018-11-25: qty 100

## 2018-11-25 MED ORDER — POLYETHYLENE GLYCOL 3350 17 G PO PACK: 17.0000 g | PACK | Freq: Every day | ORAL | Status: DC | PRN

## 2018-11-25 MED ORDER — POTASSIUM CHLORIDE 10 MEQ/100ML IV SOLN
10.0000 meq | INTRAVENOUS | Status: AC
  Administered 2018-11-25 (×2): 10 meq via INTRAVENOUS
  Filled 2018-11-25 (×2): qty 100

## 2018-11-25 MED ORDER — TRAMADOL HCL 50 MG PO TABS
50.0000 mg | ORAL_TABLET | Freq: Four times a day (QID) | ORAL | Status: DC | PRN
  Administered 2018-11-27: 50 mg via ORAL
  Filled 2018-11-25 (×2): qty 1

## 2018-11-25 MED ORDER — ESCITALOPRAM OXALATE 20 MG PO TABS
20.0000 mg | ORAL_TABLET | Freq: Every day | ORAL | Status: DC
  Administered 2018-11-25 – 2018-11-28 (×4): 20 mg via ORAL
  Filled 2018-11-25 (×3): qty 1
  Filled 2018-11-25: qty 2

## 2018-11-25 MED ORDER — ENOXAPARIN SODIUM 40 MG/0.4ML ~~LOC~~ SOLN
40.0000 mg | Freq: Every day | SUBCUTANEOUS | Status: DC
  Administered 2018-11-25 – 2018-11-28 (×4): 40 mg via SUBCUTANEOUS
  Filled 2018-11-25 (×4): qty 0.4

## 2018-11-25 MED ORDER — ENSURE ENLIVE PO LIQD
237.0000 mL | Freq: Two times a day (BID) | ORAL | Status: DC
  Administered 2018-11-25 – 2018-11-26 (×4): 237 mL via ORAL
  Filled 2018-11-25 (×2): qty 237

## 2018-11-25 MED ORDER — ACETAMINOPHEN 325 MG PO TABS: 650.0000 mg | ORAL_TABLET | Freq: Four times a day (QID) | ORAL | Status: DC | PRN

## 2018-11-25 MED ORDER — CHLORHEXIDINE GLUCONATE CLOTH 2 % EX PADS
6.0000 | MEDICATED_PAD | Freq: Every day | CUTANEOUS | Status: DC
  Administered 2018-11-25 – 2018-11-26 (×2): 6 via TOPICAL

## 2018-11-25 MED ORDER — DEXTROSE-NACL 5-0.45 % IV SOLN
INTRAVENOUS | Status: DC
  Administered 2018-11-25 (×2): via INTRAVENOUS

## 2018-11-25 MED ORDER — SODIUM CHLORIDE 0.9 % IV SOLN
INTRAVENOUS | Status: DC
  Administered 2018-11-25 – 2018-11-28 (×6): via INTRAVENOUS

## 2018-11-25 MED ORDER — SENNOSIDES-DOCUSATE SODIUM 8.6-50 MG PO TABS: 2.0000 | ORAL_TABLET | Freq: Every evening | ORAL | Status: DC | PRN

## 2018-11-25 MED ORDER — INSULIN ASPART 100 UNIT/ML ~~LOC~~ SOLN
4.0000 [IU] | Freq: Once | SUBCUTANEOUS | Status: AC
  Administered 2018-11-25: 4 [IU] via INTRAVENOUS
  Filled 2018-11-25: qty 0.04

## 2018-11-25 MED ORDER — ACETAMINOPHEN 650 MG RE SUPP: 650.0000 mg | Freq: Four times a day (QID) | RECTAL | Status: DC | PRN

## 2018-11-25 MED ORDER — GABAPENTIN 100 MG PO CAPS
200.0000 mg | ORAL_CAPSULE | Freq: Two times a day (BID) | ORAL | Status: DC
  Administered 2018-11-25 – 2018-11-28 (×6): 200 mg via ORAL
  Filled 2018-11-25 (×6): qty 2

## 2018-11-25 NOTE — BH Assessment (Signed)
[facility] Assessment Progress Note  Per Buford Dresser, DO, this pt does not require psychiatric hospitalization at this time.  Pt is psychiatrically cleared.  Discharge instructions advise pt to follow up with Beverly Sessions, pt's former outpatient provider.  EPIC record indicates that pt has a legal guardian.  At 11:59 this writer called [facility], to inform her of pt's psychiatric disposition.  She agrees to contact pt's guardian.  Pt's nurse has been notified.  Jalene Mullet, [place] Triage Specialist [phone]

## 2018-11-25 NOTE — Progress Notes (Signed)
Per Lindon Romp, NP, pt is recommended for continued observation for safety and stabilization and to be reassessed in the AM by psych. EDP Volanda Napoleon, PA-C and pt's nurse Salvatore Marvel, RN have been advised.  Lind Covert, MSW, LCSW Therapeutic Triage Specialist  [phone]

## 2018-11-25 NOTE — Progress Notes (Signed)
CSW received call from Gina [name] with disposition that patient has been psych cleared and asking to follow up to confirm if patient has a legal guardian as there is an [place] placed on patient's chart that she does from 2017. There is no documentation in patient's chart that she has a legal guadian and no past documentation from previous visits that she does.   CSW spoke with patient's daughter, Gina [name] who reports patient does not have a legal guardian, but she is working on becoming legal guardian or [place]. Gina [name] asked questions about the guardianship and [facility] POA process and CSW answered to the best of their ability. CSW gave Gina [name] an update and let her know patient was being admitted, but does not have a bed assigned at this time. CSW explained that another Education officer, museum will probably follow up with her once she is assigned to a bed on the medical floor. Gina [name] was thankful for the call.   Golden Circle, LCSW Transitions of Care Department [facility] ED [phone]

## 2018-11-25 NOTE — ED Notes (Signed)
Provided patient a warm blanket
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
## 2018-11-25 NOTE — ED Notes (Signed)
Attempted to call report, Vonna Kotyk, RN would like for me to call back in 5 minutes.

## 2018-11-25 NOTE — Progress Notes (Addendum)
CRITICAL VALUE ALERT  Critical Value:  CBG 520  Date & Time Notied:  11-25-18 2204  Provider Notified: Raliegh Ip Schorr  Orders Received/Actions taken: Patient ate many grahams crackers and the D5 fluids were just now discontinued.   New order for BMEP STAT  0007 - critical BMET results - glucose 567, anion gap 10 and bicarb 25. Notified K. Schorr.  0018-new order for 15 units novolog SQ and 1 liter NS bolus. Will continue to monitor.

## 2018-11-25 NOTE — Discharge Instructions (Signed)
For your mental health needs, you are advised to follow up with [facility].  Call them at your earliest opportunity to schedule an intake appointment: ° °     [facility] °     [address] °     [place],  [address] °     [phone] °     Crisis number: [phone] °

## 2018-11-25 NOTE — ED Notes (Signed)
Provided patient water and a cheese stick.

## 2018-11-25 NOTE — ED Notes (Signed)
Gave report to [place], Therapist, sports for room [phone].

## 2018-11-25 NOTE — ED Notes (Signed)
Pt provided with breakfast tray.

## 2018-11-25 NOTE — Progress Notes (Signed)
Gina [name] is a 58 y.o. female with medical history significant of poorly controlled diabetes, s/p bilateral BKA, HTN, and schizophrenia. She was recnetly seen after she fell out of her wheelchair and was able to return home. Her living conditions have been poor by her report.She has been out of medication and has been much more anxious and confused which kept her from seeking medical attention. Her family found her in poor condition, soiled and disorganized and brought her to the [facility].  ED Course: Hemodynamically stable but somewhat confused and paranoid. She was in soiled garments. Lab revealed at serum glucose of 721 and Na 121. The AG was normal. She is now admitted with nonketotic hyperglycemia. Glucose stablizer protocol started in ED.   11/25/18: Patient seen and examined at bedside in the ED this morning.  States she had not been taking diabetic medications at home.  She still feels bad and had loose stools overnight.  Presented with significantly elevated serum glucose greater than 700 with pseudo-hyponatremia, corrected sodium (126) for hyperglycemia (709) calculated at 136.  Currently on glucose stabilizer protocol.  Repeat BMP this afternoon.  Obtain UA and urine culture.  Also presented with AKI, creatinine of 1.24 with baseline creatinine of 0.7 and GFR 56 with baseline GFR greater than 60, likely prerenal in the setting of dehydration.  Hypovolemic on exam.  Continue IV fluid hydration.  Ambulatory dysfunction with bilateral below the knee amputation>> patient states she would not want to go to rehab/SNF.  PT OT to assess when hemodynamically stable.  Please refer to H&P dictated by Dr. Linda Hedges on 11/25/2018 for further details of the assessment and plan.

## 2018-11-25 NOTE — ED Notes (Signed)
Asked Santiago Glad, MUS to call Dr. Nevada Crane to inform about patients blood sugar.

## 2018-11-25 NOTE — ED Notes (Signed)
Received call from TTS who suggest overnight obs

## 2018-11-25 NOTE — ED Notes (Signed)
Spoke with Dr. Aileen Fass about blood sugar being 451mg /dL. She stated for patients Dextrose to be stopped till she gets to 250mg /dL. Then, she can be started back on Dextrose and to notify her when this occurs so that patient can start long acting insulin.

## 2018-11-25 NOTE — ED Notes (Signed)
When entering the room, patients Gina [name] infusion was dripping on the floor and not connected to patient.

## 2018-11-25 NOTE — ED Notes (Addendum)
Charted a previous note on wrong patient.

## 2018-11-26 DIAGNOSIS — L89159 Pressure ulcer of sacral region, unspecified stage: Secondary | ICD-10-CM | POA: Insufficient documentation

## 2018-11-26 DIAGNOSIS — L8990 Pressure ulcer of unspecified site, unspecified stage: Secondary | ICD-10-CM | POA: Insufficient documentation

## 2018-11-26 DIAGNOSIS — F320 Major depressive disorder, single episode, mild: Secondary | ICD-10-CM

## 2018-11-26 LAB — GLUCOSE, CAPILLARY
Glucose-Capillary: >600 mg/dL (ref 70–99)
Glucose-Capillary: 179 mg/dL — ABNORMAL HIGH (ref 70–99)
Glucose-Capillary: 199 mg/dL — ABNORMAL HIGH (ref 70–99)
Glucose-Capillary: 157 mg/dL — ABNORMAL HIGH (ref 70–99)
Glucose-Capillary: 90 mg/dL (ref 70–99)
Glucose-Capillary: 213 mg/dL — ABNORMAL HIGH (ref 70–99)

## 2018-11-26 LAB — BASIC METABOLIC PANEL
Anion gap: 10 (ref 5–15)
Anion gap: 10 (ref 5–15)
BUN: 32 mg/dL — ABNORMAL HIGH (ref 6–20)
BUN: 27 mg/dL — ABNORMAL HIGH (ref 6–20)
CO2: 25 mmol/L (ref 22–32)
CO2: 23 mmol/L (ref 22–32)
Calcium: 8.6 mg/dL — ABNORMAL LOW (ref 8.9–10.3)
Calcium: 8.3 mg/dL — ABNORMAL LOW (ref 8.9–10.3)
Chloride: 93 mmol/L — ABNORMAL LOW (ref 98–111)
Chloride: 101 mmol/L (ref 98–111)
Creatinine, Ser: 0.73 mg/dL (ref 0.44–1.00)
Creatinine, Ser: 0.65 mg/dL (ref 0.44–1.00)
GFR calc Af Amer: >60 mL/min (ref >60)
GFR calc Af Amer: >60 mL/min (ref >60)
GFR calc non Af Amer: >60 mL/min (ref >60)
GFR calc non Af Amer: >60 mL/min (ref >60)
Glucose, Bld: 567 mg/dL (ref 70–99)
Glucose, Bld: 281 mg/dL — ABNORMAL HIGH (ref 70–99)
Potassium: 4.7 mmol/L (ref 3.5–5.1)
Potassium: 3.5 mmol/L (ref 3.5–5.1)
Sodium: 128 mmol/L — ABNORMAL LOW (ref 135–145)
Sodium: 134 mmol/L — ABNORMAL LOW (ref 135–145)

## 2018-11-26 LAB — HEMOGLOBIN A1C
Hgb A1c MFr Bld: >15.5 % — ABNORMAL HIGH (ref 4.8–5.6)
Mean Plasma Glucose: >398 mg/dL

## 2018-11-26 MED ORDER — INSULIN GLARGINE 100 UNIT/ML ~~LOC~~ SOLN
20.0000 [IU] | Freq: Every day | SUBCUTANEOUS | Status: DC
  Administered 2018-11-26 – 2018-11-28 (×3): 20 [IU] via SUBCUTANEOUS
  Filled 2018-11-26 (×4): qty 0.2

## 2018-11-26 MED ORDER — INSULIN ASPART 100 UNIT/ML ~~LOC~~ SOLN
15.0000 [IU] | Freq: Once | SUBCUTANEOUS | Status: AC
  Administered 2018-11-26: 15 [IU] via SUBCUTANEOUS

## 2018-11-26 MED ORDER — AMLODIPINE BESYLATE 5 MG PO TABS
5.0000 mg | ORAL_TABLET | Freq: Every day | ORAL | Status: DC
  Administered 2018-11-26 – 2018-11-28 (×3): 5 mg via ORAL
  Filled 2018-11-26 (×3): qty 1

## 2018-11-26 MED ORDER — HYDRALAZINE HCL 20 MG/ML IJ SOLN: 5.0000 mg | Freq: Four times a day (QID) | INTRAMUSCULAR | Status: DC | PRN

## 2018-11-26 MED ORDER — SODIUM CHLORIDE 0.9 % IV BOLUS
1000.0000 mL | Freq: Once | INTRAVENOUS | Status: AC
  Administered 2018-11-26: 1000 mL via INTRAVENOUS

## 2018-11-26 MED ORDER — INSULIN ASPART 100 UNIT/ML ~~LOC~~ SOLN
7.0000 [IU] | Freq: Three times a day (TID) | SUBCUTANEOUS | Status: DC
  Administered 2018-11-26 – 2018-11-28 (×8): 7 [IU] via SUBCUTANEOUS

## 2018-11-26 NOTE — Progress Notes (Signed)
   11/26/18 1200  Clinical Encounter Type  Visited With Patient;Patient not available  Visit Type Initial;Psychological support;Spiritual support;Critical Care  Referral From Nurse  Consult/Referral To Chaplain  Spiritual Encounters  Spiritual Needs Emotional;Brochure;Other (Comment) (Advance Directive )  Stress Factors  Patient Stress Factors Other (Comment)  Advance Directives (For Healthcare)  Does Patient Have a Medical Advance Directive? No  Would patient like information on creating a medical advance directive? Yes (Inpatient - patient requests chaplain consult to create a medical advance directive) (Patient Provided With Paperwork)   I visited briefly with Ms. Fasig per spiritual care consult for an Advance Directive. Her nurse was in the room at the time to give her a bath. I left the paperwork for her to go over and will follow back up at a later time.   Please, contact Spiritual Care for further assistance.   Chaplain Shanon Ace M.Div., [facility]

## 2018-11-26 NOTE — Plan of Care (Signed)
  Problem: Pain Managment: Goal: General experience of comfort will improve Outcome: Progressing   Problem: Safety: Goal: Ability to remain free from injury will improve Outcome: Progressing   

## 2018-11-26 NOTE — Progress Notes (Signed)
Inpatient Diabetes Program Recommendations  AACE/ADA: New Consensus Statement on Inpatient Glycemic Control (2015)  Target Ranges:  Prepandial:   less than 140 mg/dL      Peak postprandial:   less than 180 mg/dL (1-2 hours)      Critically ill patients:  140 - 180 mg/dL   Lab Results  Component Value Date   GLUCAP 157 (H) 11/26/2018   HGBA1C >15.5 (H) 11/25/2018    Review of Glycemic Control  Diabetes history: DM2 Outpatient Diabetes medications: Lantus 25 units QD, Novolog 0-15 units tidwc, tradjenta 5 mg QD (not taking) Current orders for Inpatient glycemic control: Lantus 20 units QD, Novolog 0-9 untis tidwc and 0-5 units QHS,+ 7 units tidwc  HgbA1C - > 15.1%  Pt unable to manage her diabetes by herself, as evidenced by HgbA1C of > 15.5%.  CBGs today - 179, 199, 157 mg/dL Blood sugars labile in past 2 days, but much improved today.  Inpatient Diabetes Program Recommendations:     Agree with orders.  Will follow glucose trends and make recs if needed.  Thank you. Lorenda Peck, RD, LDN, CDE Inpatient Diabetes Coordinator [phone]
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
## 2018-11-26 NOTE — Evaluation (Signed)
Physical Therapy Evaluation Patient Details Name: Gina [name] MRN: [id number] DOB: June 22, 1960 Today's Date: 11/26/2018   History of Present Illness  58 y.o. female with medical history significant of poorly controlled diabetes, s/p bilateral BKA, HTN, and schizophrenia. Hemodynamically stable but somewhat confused and paranoid. She was in soiled garments. Lab revealed at serum glucose of 721 and Na 121. The AG was normal. She is now admitted with nonketotic hyperglycemia.  Clinical Impression  Pt admitted with above diagnosis.  Pt refuses SNF, recommend HHPT; pt performed A-P transfer with light min assist, she functions independently from w/c level at home  Pt currently with functional limitations due to the deficits listed below (see PT Problem List). Pt will benefit from skilled PT to increase their independence and safety with mobility to allow discharge to the venue listed below.     Follow Up Recommendations Home health PT    Equipment Recommendations  Other (comment)(pt requesting new w/c)    Recommendations for Other Services       Precautions / Restrictions Precautions Precautions: Fall      Mobility  Bed Mobility Overal bed mobility: Needs Assistance Bed Mobility: Supine to Sit     Supine to sit: Min guard     General bed mobility comments: for lines and safety  Transfers Overall transfer level: Needs assistance Equipment used: None Transfers: Comptroller transfers: Min assist;Min guard   General transfer comment: light assist to fully transition to chair; pt and bed soiled in urine and stool, RN made aware  Ambulation/Gait                Stairs            Wheelchair Mobility    Modified Rankin (Stroke Patients Only)       Balance Overall balance assessment: Needs assistance   Sitting balance-Leahy Scale: Good                                       Pertinent Vitals/Pain  Pain Assessment: No/denies pain    Home Living Family/patient expects to be discharged to:: Private residence     Type of Home: Apartment Home Access: Level entry     Home Layout: One level Home Equipment: Environmental consultant - 2 wheels;Bedside commode;Wheelchair - manual;Shower seat(drop arm BSC, reports her w/c is old) Additional Comments: pt states she will NOT go back to SNF; reports she does not wear her prosthesis unless she is "going out"    Prior Function                 Hand Dominance        Extremity/Trunk Assessment        Lower Extremity Assessment Lower Extremity Assessment: RLE deficits/detail;LLE deficits/detail RLE Deficits / Details: BKA, grossly WFL LLE Deficits / Details: BKA, grossly WFL       Communication   Communication: No difficulties  Cognition Arousal/Alertness: Awake/alert Behavior During Therapy: WFL for tasks assessed/performed Overall Cognitive Status: Within Functional Limits for tasks assessed                                        General Comments      Exercises     Assessment/Plan    PT Assessment    PT Problem List  PT Treatment Interventions      PT Goals (Current goals can be found in the Care Plan section)  Acute Rehab PT Goals Patient Stated Goal: home, refuses SNF PT Goal Formulation: With patient Time For Goal Achievement: 12/10/18 Potential to Achieve Goals: Good    Frequency     Barriers to discharge        Co-evaluation               AM-PAC PT "6 Clicks" Mobility  Outcome Measure Help needed turning from your back to your side while in a flat bed without using bedrails?: None Help needed moving from lying on your back to sitting on the side of a flat bed without using bedrails?: A Little Help needed moving to and from a bed to a chair (including a wheelchair)?: A Little Help needed standing up from a chair using your arms (e.g., wheelchair or bedside chair)?: Total Help  needed to walk in hospital room?: Total Help needed climbing 3-5 steps with a railing? : Total 6 Click Score: 13    End of Session   Activity Tolerance: Patient tolerated treatment well Patient left: in chair;with call bell/phone within reach;with chair alarm set        Time: 1040-1059 PT Time Calculation (min) (ACUTE ONLY): 19 min   Charges:   PT Evaluation $PT Eval Low Complexity: 1 Low          Kenyon Ana, PT  Pager: [phone] Acute Rehab Dept [facility]): [id number]   11/26/2018   [facility] 11/26/2018, 11:36 AM

## 2018-11-27 DIAGNOSIS — R4182 Altered mental status, unspecified: Secondary | ICD-10-CM

## 2018-11-27 LAB — GLUCOSE, CAPILLARY
Glucose-Capillary: 194 mg/dL — ABNORMAL HIGH (ref 70–99)
Glucose-Capillary: 179 mg/dL — ABNORMAL HIGH (ref 70–99)
Glucose-Capillary: 127 mg/dL — ABNORMAL HIGH (ref 70–99)
Glucose-Capillary: 184 mg/dL — ABNORMAL HIGH (ref 70–99)

## 2018-11-27 LAB — BASIC METABOLIC PANEL
Anion gap: 5 (ref 5–15)
BUN: 26 mg/dL — ABNORMAL HIGH (ref 6–20)
CO2: 22 mmol/L (ref 22–32)
Calcium: 8.5 mg/dL — ABNORMAL LOW (ref 8.9–10.3)
Chloride: 106 mmol/L (ref 98–111)
Creatinine, Ser: 0.52 mg/dL (ref 0.44–1.00)
GFR calc Af Amer: >60 mL/min (ref >60)
GFR calc non Af Amer: >60 mL/min (ref >60)
Glucose, Bld: 185 mg/dL — ABNORMAL HIGH (ref 70–99)
Potassium: 4.3 mmol/L (ref 3.5–5.1)
Sodium: 133 mmol/L — ABNORMAL LOW (ref 135–145)

## 2018-11-27 MED ORDER — GLUCERNA SHAKE PO LIQD
237.0000 mL | Freq: Two times a day (BID) | ORAL | Status: DC
  Administered 2018-11-27 – 2018-11-28 (×2): 237 mL via ORAL
  Filled 2018-11-27 (×4): qty 237

## 2018-11-27 NOTE — Plan of Care (Signed)
  Problem: Coping: Goal: Level of anxiety will decrease Outcome: Progressing   Problem: Elimination: Goal: Will not experience complications related to bowel motility Outcome: Progressing   Problem: Pain Managment: Goal: General experience of comfort will improve Outcome: Progressing   Problem: Safety: Goal: Ability to remain free from injury will improve Outcome: Progressing   
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
## 2018-11-28 LAB — BASIC METABOLIC PANEL
Anion gap: 4 — ABNORMAL LOW (ref 5–15)
BUN: 20 mg/dL (ref 6–20)
CO2: 22 mmol/L (ref 22–32)
Calcium: 8.2 mg/dL — ABNORMAL LOW (ref 8.9–10.3)
Chloride: 109 mmol/L (ref 98–111)
Creatinine, Ser: 0.5 mg/dL (ref 0.44–1.00)
GFR calc Af Amer: >60 mL/min (ref >60)
GFR calc non Af Amer: >60 mL/min (ref >60)
Glucose, Bld: 197 mg/dL — ABNORMAL HIGH (ref 70–99)
Potassium: 4.3 mmol/L (ref 3.5–5.1)
Sodium: 135 mmol/L (ref 135–145)

## 2018-11-28 LAB — GLUCOSE, CAPILLARY
Glucose-Capillary: 173 mg/dL — ABNORMAL HIGH (ref 70–99)
Glucose-Capillary: 249 mg/dL — ABNORMAL HIGH (ref 70–99)

## 2018-11-28 LAB — URINE CULTURE
Culture: >=100000 — AB
Special Requests: NORMAL

## 2018-11-28 MED ORDER — AMLODIPINE BESYLATE 5 MG PO TABS: 5.0000 mg | ORAL_TABLET | Freq: Every day | ORAL | 0 refills | Status: DC

## 2018-11-28 MED ORDER — CIPROFLOXACIN HCL 500 MG PO TABS: 500.0000 mg | ORAL_TABLET | Freq: Two times a day (BID) | ORAL | 0 refills | Status: AC

## 2018-11-28 MED ORDER — CIPROFLOXACIN HCL 500 MG PO TABS
500.0000 mg | ORAL_TABLET | Freq: Two times a day (BID) | ORAL | Status: DC
  Administered 2018-11-28: 500 mg via ORAL
  Filled 2018-11-28: qty 1

## 2018-11-28 MED ORDER — GABAPENTIN 100 MG PO CAPS: 200.0000 mg | ORAL_CAPSULE | Freq: Two times a day (BID) | ORAL | 0 refills | Status: DC

## 2018-11-28 NOTE — TOC Progression Note (Signed)
Transition of Care [facility]) - Progression Note    Patient Details  Name: Gina [name] MRN: [id number] Date of Birth: [birth year]  [facility] CM/SW Contact  Gina Mouton, RN Phone Number: 11/28/2018, 11:07 AM  Clinical Narrative:    Spoke with pt in length. Pt states that she had a PCS from different agencies and want another. Encouraged pt to follow up her PCP concerning another PCS. Pt asking for personal care items and housing. Directed pt to reach out to [employer], [employer] and [employer]. Also encouraged pt to speak with her landlord about her concerns. The hospital is unable to supply pt with towels, wash clothes, co=pay for medications and other items. Pt has Medicare and Medicaid insurance.    Expected Discharge Plan: [place]    Expected Discharge Plan and Services Expected Discharge Plan: [place]   Discharge Planning Services: CM Consult   Living arrangements for the past 2 months: Apartment Expected Discharge Date: (unknown)                                     Social Determinants of Health (SDOH) Interventions    Readmission Risk Interventions Readmission Risk Prevention Plan 11/07/2017  Post Dischage Appt Patient refused  Medication Screening Complete  Transportation Screening Complete  PCP follow-up Patient refused  Some recent data might be hidden

## 2018-11-28 NOTE — Care Management Important Message (Signed)
Important Message  Patient Details IM Letter given to Cookie McGibboney RN to present to the Patient Name: Gina [name] MRN: [id number] Date of Birth: [birth year]   Medicare Important Message Given:  Yes     Kerin Salen 11/28/2018, 10:00 AM

## 2018-11-28 NOTE — Progress Notes (Signed)
Occupational Therapy Treatment Patient Details Name: Gina [name] MRN: [id number] DOB: 1960/08/04 Today's Date: 11/28/2018    History of present illness 58 y.o. female with medical history significant of poorly controlled diabetes, s/p bilateral BKA, HTN, and schizophrenia. Hemodynamically stable but somewhat confused and paranoid. She was in soiled garments. Lab revealed at serum glucose of 721 and Na 121. The AG was normal. She is now admitted with nonketotic hyperglycemia.   OT comments  Care manager working with pt on WC.  Pt does need WC to get to bathroom.  Pt did state sister had a back up [place] pt would be able to use until she was able to get anew one.   Follow Up Recommendations  SNF;Home health OT;Supervision/Assistance - 24 hour    Equipment Recommendations  Wheelchair (measurements OT)    Recommendations for Other Services      Precautions / Restrictions Precautions Precautions: Fall       Mobility Bed Mobility   Bed Mobility: Supine to Sit;Sit to Supine     Supine to sit: Supervision Sit to supine: Supervision   General bed mobility comments: using bed rails  Transfers                 General transfer comment: did not perform    Balance Overall balance assessment: Needs assistance   Sitting balance-Leahy Scale: Good                                     ADL either performed or assessed with clinical judgement   ADL Overall ADL's : Needs assistance/impaired                    Pt overall set up with B/D sitting EOB.  ( leaning left and right) Pt states her family will A as needed.  Pt would benefit on being in  A setting with increased A with ADL activity.  Pt declines.                   General ADL Comments: pt continues to refuse SNF.  Pt has several stories regarding the location of her WC.  Sister is getting pt a WC but pt states she needs a new one.  Pts want an electric WC so she doesnt get stuck in traffic.                Cognition Arousal/Alertness: Awake/alert Behavior During Therapy: Anxious Overall Cognitive Status: Within Functional Limits for tasks assessed                                 General Comments: anxious about DC and WC.    Case Manager addressing                   Pertinent Vitals/ Pain       Pain Assessment: No/denies pain     Prior Functioning/Environment              Frequency  Min 2X/week        Progress Toward Goals  OT Goals(current goals can now be found in the care plan section)  Progress towards OT goals: Progressing toward goals     Plan Discharge plan remains appropriate       AM-PAC OT "6 Clicks" Daily Activity     Outcome Measure  Help from another person eating meals?: None Help from another person taking care of personal grooming?: None Help from another person toileting, which includes using toliet, bedpan, or urinal?: A Lot Help from another person bathing (including washing, rinsing, drying)?: A Lot Help from another person to put on and taking off regular upper body clothing?: A Little Help from another person to put on and taking off regular lower body clothing?: A Lot 6 Click Score: 17    End of Session    OT Visit Diagnosis: Other abnormalities of gait and mobility (R26.89);Muscle weakness (generalized) (M62.81);Repeated falls (R29.6);History of falling (Z91.81)   Activity Tolerance Patient tolerated treatment well   Patient Left in chair;with call bell/phone within reach;with family/visitor present   Nurse Communication Mobility status        Time: 0240-0252 OT Time Calculation (min): 12 min  Charges: OT General Charges $OT Visit: 1 Visit OT Treatments $Self Care/Home Management : 8-22 mins  Kari Baars, [place] [phone] Office- [place], Edwena Felty D 11/28/2018, 3:31 PM

## 2018-11-28 NOTE — Progress Notes (Signed)
Patient is stable for discharge. Discharge instructions and medications have been reviewed with the patient and all questions answered. AVS and prescriptions given to patient. Patient given printed prescription for a Wheelchair and instructions provided on AVS for where to go and take her prescription for get the wheelchair. Follow up appointment made and placed on AVS per MD orders. [facility] here to transport patient.   Wynne Jury, [place] Din 11/28/2018

## 2018-11-28 NOTE — TOC Progression Note (Signed)
Transition of Care [facility]) - Progression Note    Patient Details  Name: Gina [name] MRN: [id number] Date of Birth: 03/18/1960  Transition of [facility]) CM/SW Contact  [name], RN Phone Number: 11/28/2018, 3:21 PM  Clinical Narrative:    Explained to pt that she would need to pay for wheel chair related to pt receiving a light weight wheel chair April 07, 2017. Pt will need to pay $83.00/month for a new one to rent. Pt agreed. Pt will not be able to take [facility] on EMS truck and will need to have it delivered or pick up at store. Pt is aware of this information.   Expected Discharge Plan: [place]    Expected Discharge Plan and Services Expected Discharge Plan: [place]   Discharge Planning Services: CM Consult   Living arrangements for the past 2 months: Apartment Expected Discharge Date: 11/28/18                                     Social Determinants of Health (SDOH) Interventions    Readmission Risk Interventions Readmission Risk Prevention Plan 11/07/2017  Post Dischage Appt Patient refused  Medication Screening Complete  Transportation Screening Complete  PCP follow-up Patient refused  Some recent data might be hidden
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
## 2018-12-09 ENCOUNTER — Ambulatory Visit: Payer: Medicare Other | Admitting: Orthopedic Surgery

## 2018-12-16 ENCOUNTER — Ambulatory Visit: Payer: Medicare Other | Admitting: Orthopedic Surgery

## 2018-12-17 ENCOUNTER — Ambulatory Visit (INDEPENDENT_AMBULATORY_CARE_PROVIDER_SITE_OTHER): Payer: Medicare Other | Admitting: Orthopedic Surgery

## 2018-12-17 ENCOUNTER — Encounter: Payer: Self-pay | Admitting: Orthopedic Surgery

## 2018-12-17 VITALS — Ht 62.0 in | Wt 120.8 lb

## 2018-12-17 DIAGNOSIS — Z89511 Acquired absence of right leg below knee: Secondary | ICD-10-CM | POA: Diagnosis not present

## 2018-12-17 DIAGNOSIS — Z89512 Acquired absence of left leg below knee: Secondary | ICD-10-CM
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
## 2018-12-19 ENCOUNTER — Telehealth: Payer: Self-pay | Admitting: Orthopedic Surgery

## 2018-12-19 NOTE — Telephone Encounter (Signed)
Patient  Called asked if the wheelchair can be delivered to her home from [facility]. Patient said she do not have a way to pick the wheelchair up. The number to contact patient is [phone] or [phone]

## 2018-12-20 NOTE — Telephone Encounter (Signed)
Patient was called and lvm informing her that she will have to call [facility] to see if they can deliver her wheelchair to her. [place] # was left for her [phone]. If patient has issues with them delivering we can get her set-up with another medical supply company.

## 2018-12-25 ENCOUNTER — Inpatient Hospital Stay (HOSPITAL_COMMUNITY)
Admission: EM | Admit: 2018-12-25 | Discharge: 2018-12-30 | DRG: 853 | Disposition: A | Discharge status: Home Health | Payer: Medicare Other | Attending: Internal Medicine | Admitting: Internal Medicine

## 2018-12-25 ENCOUNTER — Other Ambulatory Visit: Payer: Self-pay

## 2018-12-25 ENCOUNTER — Encounter (HOSPITAL_COMMUNITY): Payer: Self-pay | Admitting: *Deleted

## 2018-12-25 DIAGNOSIS — Z833 Family history of diabetes mellitus: Secondary | ICD-10-CM

## 2018-12-25 DIAGNOSIS — L089 Local infection of the skin and subcutaneous tissue, unspecified: Secondary | ICD-10-CM

## 2018-12-25 DIAGNOSIS — I96 Gangrene, not elsewhere classified: Secondary | ICD-10-CM | POA: Diagnosis present

## 2018-12-25 DIAGNOSIS — R2232 Localized swelling, mass and lump, left upper limb: Secondary | ICD-10-CM | POA: Diagnosis not present

## 2018-12-25 DIAGNOSIS — A4101 Sepsis due to Methicillin susceptible Staphylococcus aureus: Secondary | ICD-10-CM | POA: Diagnosis not present

## 2018-12-25 DIAGNOSIS — G928 Other toxic encephalopathy: Secondary | ICD-10-CM | POA: Diagnosis present

## 2018-12-25 DIAGNOSIS — E43 Unspecified severe protein-calorie malnutrition: Secondary | ICD-10-CM | POA: Diagnosis present

## 2018-12-25 DIAGNOSIS — Z79899 Other long term (current) drug therapy: Secondary | ICD-10-CM

## 2018-12-25 DIAGNOSIS — A419 Sepsis, unspecified organism: Secondary | ICD-10-CM | POA: Diagnosis present

## 2018-12-25 DIAGNOSIS — L89890 Pressure ulcer of other site, unstageable: Secondary | ICD-10-CM | POA: Diagnosis present

## 2018-12-25 DIAGNOSIS — L89210 Pressure ulcer of right hip, unstageable: Secondary | ICD-10-CM | POA: Diagnosis present

## 2018-12-25 DIAGNOSIS — D638 Anemia in other chronic diseases classified elsewhere: Secondary | ICD-10-CM | POA: Diagnosis present

## 2018-12-25 DIAGNOSIS — Z8249 Family history of ischemic heart disease and other diseases of the circulatory system: Secondary | ICD-10-CM

## 2018-12-25 DIAGNOSIS — M65142 Other infective (teno)synovitis, left hand: Secondary | ICD-10-CM | POA: Diagnosis present

## 2018-12-25 DIAGNOSIS — E11649 Type 2 diabetes mellitus with hypoglycemia without coma: Secondary | ICD-10-CM | POA: Diagnosis not present

## 2018-12-25 DIAGNOSIS — L89320 Pressure ulcer of left buttock, unstageable: Secondary | ICD-10-CM | POA: Diagnosis present

## 2018-12-25 DIAGNOSIS — R739 Hyperglycemia, unspecified: Secondary | ICD-10-CM

## 2018-12-25 DIAGNOSIS — E1152 Type 2 diabetes mellitus with diabetic peripheral angiopathy with gangrene: Secondary | ICD-10-CM | POA: Diagnosis present

## 2018-12-25 DIAGNOSIS — L02512 Cutaneous abscess of left hand: Secondary | ICD-10-CM | POA: Diagnosis present

## 2018-12-25 DIAGNOSIS — F209 Schizophrenia, unspecified: Secondary | ICD-10-CM | POA: Diagnosis present

## 2018-12-25 DIAGNOSIS — E1100 Type 2 diabetes mellitus with hyperosmolarity without nonketotic hyperglycemic-hyperosmolar coma (NKHHC): Secondary | ICD-10-CM | POA: Diagnosis present

## 2018-12-25 DIAGNOSIS — G92 Toxic encephalopathy: Secondary | ICD-10-CM | POA: Diagnosis present

## 2018-12-25 DIAGNOSIS — Z888 Allergy status to other drugs, medicaments and biological substances status: Secondary | ICD-10-CM

## 2018-12-25 DIAGNOSIS — I10 Essential (primary) hypertension: Secondary | ICD-10-CM | POA: Diagnosis present

## 2018-12-25 DIAGNOSIS — Z20828 Contact with and (suspected) exposure to other viral communicable diseases: Secondary | ICD-10-CM | POA: Diagnosis present

## 2018-12-25 DIAGNOSIS — Z6823 Body mass index (BMI) 23.0-23.9, adult: Secondary | ICD-10-CM

## 2018-12-25 DIAGNOSIS — G9341 Metabolic encephalopathy: Secondary | ICD-10-CM | POA: Diagnosis present

## 2018-12-25 DIAGNOSIS — Z9114 Patient's other noncompliance with medication regimen: Secondary | ICD-10-CM

## 2018-12-25 DIAGNOSIS — L03012 Cellulitis of left finger: Secondary | ICD-10-CM | POA: Diagnosis present

## 2018-12-25 DIAGNOSIS — R6520 Severe sepsis without septic shock: Secondary | ICD-10-CM | POA: Diagnosis present

## 2018-12-25 DIAGNOSIS — Z794 Long term (current) use of insulin: Secondary | ICD-10-CM

## 2018-12-25 DIAGNOSIS — E871 Hypo-osmolality and hyponatremia: Secondary | ICD-10-CM | POA: Diagnosis present

## 2018-12-25 DIAGNOSIS — Z89512 Acquired absence of left leg below knee: Secondary | ICD-10-CM

## 2018-12-25 DIAGNOSIS — L03114 Cellulitis of left upper limb: Secondary | ICD-10-CM

## 2018-12-25 DIAGNOSIS — Z89511 Acquired absence of right leg below knee: Secondary | ICD-10-CM

## 2018-12-25 LAB — CBG MONITORING, ED: Glucose-Capillary: >600 mg/dL (ref 70–99)

## 2018-12-25 MED ORDER — SODIUM CHLORIDE 0.9% FLUSH
3.0000 mL | Freq: Once | INTRAVENOUS | Status: AC
  Administered 2018-12-26: 3 mL via INTRAVENOUS

## 2018-12-25 MED ORDER — SODIUM CHLORIDE 0.9 % IV BOLUS
1000.0000 mL | Freq: Once | INTRAVENOUS | Status: AC
  Administered 2018-12-26: 1000 mL via INTRAVENOUS

## 2018-12-25 NOTE — ED Triage Notes (Signed)
Pt is from home via [facility],  middle finger swollen, thinks she was bitten by something. Ongoing for about two weeks. CBG reading "high" for [facility], pt cannot remember if she has been taking her insulin. 112/42, hr 96, 98% RA, oriented.
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
## 2018-12-25 NOTE — ED Notes (Signed)
Pt has extreme irritation in the groin area. Several superficial wounds where skin break down is present on bottom. Dry peeling skin also present. Pt states she does not has an adequate/consistant caregiver at home to assist with care. Pt also states that she needs a home health aid.

## 2018-12-26 ENCOUNTER — Inpatient Hospital Stay (HOSPITAL_COMMUNITY): Payer: Medicare Other | Admitting: Certified Registered"

## 2018-12-26 ENCOUNTER — Encounter (HOSPITAL_COMMUNITY)
Admission: EM | Disposition: A | Discharge status: Home Health | Payer: Self-pay | Source: Home / Self Care | Attending: Internal Medicine

## 2018-12-26 ENCOUNTER — Encounter (HOSPITAL_COMMUNITY): Payer: Self-pay | Admitting: Internal Medicine

## 2018-12-26 ENCOUNTER — Emergency Department (HOSPITAL_COMMUNITY): Payer: Medicare Other

## 2018-12-26 DIAGNOSIS — R2232 Localized swelling, mass and lump, left upper limb: Secondary | ICD-10-CM | POA: Diagnosis present

## 2018-12-26 DIAGNOSIS — E43 Unspecified severe protein-calorie malnutrition: Secondary | ICD-10-CM | POA: Diagnosis present

## 2018-12-26 DIAGNOSIS — L03114 Cellulitis of left upper limb: Secondary | ICD-10-CM | POA: Diagnosis not present

## 2018-12-26 DIAGNOSIS — M65142 Other infective (teno)synovitis, left hand: Secondary | ICD-10-CM | POA: Diagnosis present

## 2018-12-26 DIAGNOSIS — L03012 Cellulitis of left finger: Secondary | ICD-10-CM | POA: Diagnosis present

## 2018-12-26 DIAGNOSIS — L89890 Pressure ulcer of other site, unstageable: Secondary | ICD-10-CM | POA: Diagnosis present

## 2018-12-26 DIAGNOSIS — L89320 Pressure ulcer of left buttock, unstageable: Secondary | ICD-10-CM | POA: Diagnosis present

## 2018-12-26 DIAGNOSIS — I10 Essential (primary) hypertension: Secondary | ICD-10-CM | POA: Diagnosis present

## 2018-12-26 DIAGNOSIS — D638 Anemia in other chronic diseases classified elsewhere: Secondary | ICD-10-CM | POA: Diagnosis present

## 2018-12-26 DIAGNOSIS — R6520 Severe sepsis without septic shock: Secondary | ICD-10-CM | POA: Diagnosis present

## 2018-12-26 DIAGNOSIS — Z20828 Contact with and (suspected) exposure to other viral communicable diseases: Secondary | ICD-10-CM | POA: Diagnosis present

## 2018-12-26 DIAGNOSIS — G9341 Metabolic encephalopathy: Secondary | ICD-10-CM | POA: Diagnosis present

## 2018-12-26 DIAGNOSIS — A4101 Sepsis due to Methicillin susceptible Staphylococcus aureus: Secondary | ICD-10-CM | POA: Diagnosis present

## 2018-12-26 DIAGNOSIS — E1100 Type 2 diabetes mellitus with hyperosmolarity without nonketotic hyperglycemic-hyperosmolar coma (NKHHC): Secondary | ICD-10-CM | POA: Diagnosis present

## 2018-12-26 DIAGNOSIS — E1152 Type 2 diabetes mellitus with diabetic peripheral angiopathy with gangrene: Secondary | ICD-10-CM | POA: Diagnosis present

## 2018-12-26 DIAGNOSIS — E871 Hypo-osmolality and hyponatremia: Secondary | ICD-10-CM | POA: Diagnosis present

## 2018-12-26 DIAGNOSIS — E10649 Type 1 diabetes mellitus with hypoglycemia without coma: Secondary | ICD-10-CM | POA: Diagnosis not present

## 2018-12-26 DIAGNOSIS — A419 Sepsis, unspecified organism: Secondary | ICD-10-CM | POA: Diagnosis not present

## 2018-12-26 DIAGNOSIS — I96 Gangrene, not elsewhere classified: Secondary | ICD-10-CM | POA: Diagnosis not present

## 2018-12-26 DIAGNOSIS — Z6823 Body mass index (BMI) 23.0-23.9, adult: Secondary | ICD-10-CM | POA: Diagnosis not present

## 2018-12-26 DIAGNOSIS — L02519 Cutaneous abscess of unspecified hand: Secondary | ICD-10-CM | POA: Diagnosis not present

## 2018-12-26 DIAGNOSIS — F209 Schizophrenia, unspecified: Secondary | ICD-10-CM | POA: Diagnosis present

## 2018-12-26 DIAGNOSIS — Z8249 Family history of ischemic heart disease and other diseases of the circulatory system: Secondary | ICD-10-CM | POA: Diagnosis not present

## 2018-12-26 DIAGNOSIS — Z89512 Acquired absence of left leg below knee: Secondary | ICD-10-CM | POA: Diagnosis not present

## 2018-12-26 DIAGNOSIS — Z9114 Patient's other noncompliance with medication regimen: Secondary | ICD-10-CM | POA: Diagnosis not present

## 2018-12-26 DIAGNOSIS — E0865 Diabetes mellitus due to underlying condition with hyperglycemia: Secondary | ICD-10-CM | POA: Diagnosis not present

## 2018-12-26 DIAGNOSIS — E11649 Type 2 diabetes mellitus with hypoglycemia without coma: Secondary | ICD-10-CM | POA: Diagnosis not present

## 2018-12-26 DIAGNOSIS — L03119 Cellulitis of unspecified part of limb: Secondary | ICD-10-CM | POA: Diagnosis not present

## 2018-12-26 DIAGNOSIS — Z89511 Acquired absence of right leg below knee: Secondary | ICD-10-CM | POA: Diagnosis not present

## 2018-12-26 DIAGNOSIS — Z833 Family history of diabetes mellitus: Secondary | ICD-10-CM | POA: Diagnosis not present

## 2018-12-26 DIAGNOSIS — L02512 Cutaneous abscess of left hand: Secondary | ICD-10-CM | POA: Diagnosis present

## 2018-12-26 DIAGNOSIS — Z794 Long term (current) use of insulin: Secondary | ICD-10-CM | POA: Diagnosis not present

## 2018-12-26 HISTORY — PX: INCISION AND DRAINAGE OF WOUND: SHX1803

## 2018-12-26 LAB — BASIC METABOLIC PANEL
Anion gap: 11 (ref 5–15)
Anion gap: 11 (ref 5–15)
Anion gap: 8 (ref 5–15)
BUN: 8 mg/dL (ref 6–20)
BUN: 9 mg/dL (ref 6–20)
BUN: 9 mg/dL (ref 6–20)
CO2: 25 mmol/L (ref 22–32)
CO2: 25 mmol/L (ref 22–32)
CO2: 28 mmol/L (ref 22–32)
Calcium: 8.5 mg/dL — ABNORMAL LOW (ref 8.9–10.3)
Calcium: 8.4 mg/dL — ABNORMAL LOW (ref 8.9–10.3)
Calcium: 8.4 mg/dL — ABNORMAL LOW (ref 8.9–10.3)
Chloride: 93 mmol/L — ABNORMAL LOW (ref 98–111)
Chloride: 95 mmol/L — ABNORMAL LOW (ref 98–111)
Chloride: 97 mmol/L — ABNORMAL LOW (ref 98–111)
Creatinine, Ser: 0.69 mg/dL (ref 0.44–1.00)
Creatinine, Ser: 0.49 mg/dL (ref 0.44–1.00)
Creatinine, Ser: 0.5 mg/dL (ref 0.44–1.00)
GFR calc Af Amer: >60 mL/min (ref >60)
GFR calc Af Amer: >60 mL/min (ref >60)
GFR calc Af Amer: >60 mL/min (ref >60)
GFR calc non Af Amer: >60 mL/min (ref >60)
GFR calc non Af Amer: >60 mL/min (ref >60)
GFR calc non Af Amer: >60 mL/min (ref >60)
Glucose, Bld: 479 mg/dL — ABNORMAL HIGH (ref 70–99)
Glucose, Bld: 195 mg/dL — ABNORMAL HIGH (ref 70–99)
Glucose, Bld: 78 mg/dL (ref 70–99)
Potassium: 3.7 mmol/L (ref 3.5–5.1)
Potassium: 3.6 mmol/L (ref 3.5–5.1)
Potassium: 3.6 mmol/L (ref 3.5–5.1)
Sodium: 129 mmol/L — ABNORMAL LOW (ref 135–145)
Sodium: 131 mmol/L — ABNORMAL LOW (ref 135–145)
Sodium: 133 mmol/L — ABNORMAL LOW (ref 135–145)

## 2018-12-26 LAB — CBC WITH DIFFERENTIAL/PLATELET
Abs Immature Granulocytes: 0.12 10*3/uL — ABNORMAL HIGH (ref 0.00–0.07)
Basophils Absolute: 0 10*3/uL (ref 0.0–0.1)
Basophils Relative: 0 %
Eosinophils Absolute: 0 10*3/uL (ref 0.0–0.5)
Eosinophils Relative: 0 %
HCT: 30.7 % — ABNORMAL LOW (ref 36.0–46.0)
Hemoglobin: 9.9 g/dL — ABNORMAL LOW (ref 12.0–15.0)
Immature Granulocytes: 1 %
Lymphocytes Relative: 8 %
Lymphs Abs: 1.3 10*3/uL (ref 0.7–4.0)
MCH: 30 pg (ref 26.0–34.0)
MCHC: 32.2 g/dL (ref 30.0–36.0)
MCV: 93 fL (ref 80.0–100.0)
Monocytes Absolute: 1.4 10*3/uL — ABNORMAL HIGH (ref 0.1–1.0)
Monocytes Relative: 8 %
Neutro Abs: 14.2 10*3/uL — ABNORMAL HIGH (ref 1.7–7.7)
Neutrophils Relative %: 83 %
Platelets: 375 10*3/uL (ref 150–400)
RBC: 3.3 MIL/uL — ABNORMAL LOW (ref 3.87–5.11)
RDW: 12 % (ref 11.5–15.5)
WBC: 17 10*3/uL — ABNORMAL HIGH (ref 4.0–10.5)
nRBC: 0 % (ref 0.0–0.2)

## 2018-12-26 LAB — CBC
HCT: 28.7 % — ABNORMAL LOW (ref 36.0–46.0)
Hemoglobin: 9.5 g/dL — ABNORMAL LOW (ref 12.0–15.0)
MCH: 30.8 pg (ref 26.0–34.0)
MCHC: 33.1 g/dL (ref 30.0–36.0)
MCV: 93.2 fL (ref 80.0–100.0)
Platelets: 347 10*3/uL (ref 150–400)
RBC: 3.08 MIL/uL — ABNORMAL LOW (ref 3.87–5.11)
RDW: 12.1 % (ref 11.5–15.5)
WBC: 17.8 10*3/uL — ABNORMAL HIGH (ref 4.0–10.5)
nRBC: 0 % (ref 0.0–0.2)

## 2018-12-26 LAB — AMMONIA: Ammonia: 13 umol/L (ref 9–35)

## 2018-12-26 LAB — GLUCOSE, CAPILLARY
Glucose-Capillary: 502 mg/dL (ref 70–99)
Glucose-Capillary: 320 mg/dL — ABNORMAL HIGH (ref 70–99)
Glucose-Capillary: 238 mg/dL — ABNORMAL HIGH (ref 70–99)
Glucose-Capillary: 195 mg/dL — ABNORMAL HIGH (ref 70–99)
Glucose-Capillary: 143 mg/dL — ABNORMAL HIGH (ref 70–99)
Glucose-Capillary: 124 mg/dL — ABNORMAL HIGH (ref 70–99)
Glucose-Capillary: 75 mg/dL (ref 70–99)
Glucose-Capillary: 91 mg/dL (ref 70–99)
Glucose-Capillary: 432 mg/dL — ABNORMAL HIGH (ref 70–99)

## 2018-12-26 LAB — COMPREHENSIVE METABOLIC PANEL
ALT: 18 U/L (ref 0–44)
AST: 14 U/L — ABNORMAL LOW (ref 15–41)
Albumin: 2.4 g/dL — ABNORMAL LOW (ref 3.5–5.0)
Alkaline Phosphatase: 121 U/L (ref 38–126)
Anion gap: 15 (ref 5–15)
BUN: 9 mg/dL (ref 6–20)
CO2: 24 mmol/L (ref 22–32)
Calcium: 8.6 mg/dL — ABNORMAL LOW (ref 8.9–10.3)
Chloride: 82 mmol/L — ABNORMAL LOW (ref 98–111)
Creatinine, Ser: 0.81 mg/dL (ref 0.44–1.00)
GFR calc Af Amer: >60 mL/min (ref >60)
GFR calc non Af Amer: >60 mL/min (ref >60)
Glucose, Bld: 880 mg/dL (ref 70–99)
Potassium: 4.6 mmol/L (ref 3.5–5.1)
Sodium: 121 mmol/L — ABNORMAL LOW (ref 135–145)
Total Bilirubin: 1.1 mg/dL (ref 0.3–1.2)
Total Protein: 6.7 g/dL (ref 6.5–8.1)

## 2018-12-26 LAB — SARS CORONAVIRUS 2 BY RT PCR (HOSPITAL ORDER, PERFORMED IN ~~LOC~~ HOSPITAL LAB): SARS Coronavirus 2: NEGATIVE

## 2018-12-26 LAB — ABO/RH: ABO/RH(D): O POS

## 2018-12-26 LAB — CBG MONITORING, ED
Glucose-Capillary: >600 mg/dL (ref 70–99)
Glucose-Capillary: 580 mg/dL (ref 70–99)
Glucose-Capillary: 506 mg/dL (ref 70–99)

## 2018-12-26 LAB — TYPE AND SCREEN
ABO/RH(D): O POS
Antibody Screen: NEGATIVE

## 2018-12-26 LAB — BLOOD GAS, ARTERIAL
Acid-Base Excess: 2.3 mmol/L — ABNORMAL HIGH (ref 0.0–2.0)
Bicarbonate: 28 mmol/L (ref 20.0–28.0)
FIO2: 21
O2 Saturation: 91.1 %
Patient temperature: 99
pCO2 arterial: 49.2 mmHg — ABNORMAL HIGH (ref 32.0–48.0)
pH, Arterial: 7.375 (ref 7.350–7.450)
pO2, Arterial: 66.4 mmHg — ABNORMAL LOW (ref 83.0–108.0)

## 2018-12-26 LAB — LACTIC ACID, PLASMA
Lactic Acid, Venous: 1.3 mmol/L (ref 0.5–1.9)
Lactic Acid, Venous: 3.5 mmol/L (ref 0.5–1.9)
Lactic Acid, Venous: 3.7 mmol/L (ref 0.5–1.9)

## 2018-12-26 LAB — MRSA PCR SCREENING: MRSA by PCR: NEGATIVE

## 2018-12-26 SURGERY — IRRIGATION AND DEBRIDEMENT WOUND
Anesthesia: General | Site: Hand | Laterality: Left

## 2018-12-26 MED ORDER — OXYCODONE HCL 5 MG/5ML PO SOLN: 5.0000 mg | Freq: Once | ORAL | Status: DC | PRN

## 2018-12-26 MED ORDER — DEXTROSE 50 % IV SOLN: 25.0000 mL | INTRAVENOUS | Status: DC | PRN

## 2018-12-26 MED ORDER — CLINDAMYCIN PHOSPHATE 900 MG/50ML IV SOLN
INTRAVENOUS | Status: AC
  Administered 2018-12-26: 14:00:00
  Filled 2018-12-26: qty 50

## 2018-12-26 MED ORDER — 0.9 % SODIUM CHLORIDE (POUR BTL) OPTIME
TOPICAL | Status: DC | PRN
  Administered 2018-12-26: 1000 mL

## 2018-12-26 MED ORDER — NYSTATIN 100000 UNIT/GM EX POWD
Freq: Once | CUTANEOUS | Status: AC
  Administered 2018-12-26: 02:00:00 via TOPICAL
  Filled 2018-12-26: qty 15

## 2018-12-26 MED ORDER — PROPOFOL 10 MG/ML IV BOLUS
INTRAVENOUS | Status: DC | PRN
  Administered 2018-12-26: 70 mg via INTRAVENOUS
  Administered 2018-12-26: 10 mg via INTRAVENOUS
  Administered 2018-12-26: 20 mg via INTRAVENOUS

## 2018-12-26 MED ORDER — SODIUM CHLORIDE 0.9 % IV BOLUS
1000.0000 mL | Freq: Once | INTRAVENOUS | Status: AC
  Administered 2018-12-26: 1000 mL via INTRAVENOUS

## 2018-12-26 MED ORDER — DEXTROSE-NACL 5-0.45 % IV SOLN
INTRAVENOUS | Status: DC
  Administered 2018-12-26: 09:00:00 via INTRAVENOUS

## 2018-12-26 MED ORDER — LACTATED RINGERS IV SOLN
INTRAVENOUS | Status: DC | PRN
  Administered 2018-12-26: 14:00:00 via INTRAVENOUS

## 2018-12-26 MED ORDER — DEXAMETHASONE SODIUM PHOSPHATE 10 MG/ML IJ SOLN
INTRAMUSCULAR | Status: AC
  Filled 2018-12-26: qty 1

## 2018-12-26 MED ORDER — INSULIN ASPART 100 UNIT/ML ~~LOC~~ SOLN
10.0000 [IU] | Freq: Once | SUBCUTANEOUS | Status: AC
  Administered 2018-12-26: 10 [IU] via INTRAVENOUS
  Filled 2018-12-26: qty 0.1

## 2018-12-26 MED ORDER — OXYCODONE HCL 5 MG PO TABS: 5.0000 mg | ORAL_TABLET | Freq: Once | ORAL | Status: DC | PRN

## 2018-12-26 MED ORDER — ACETAMINOPHEN 500 MG PO TABS: 1000.0000 mg | ORAL_TABLET | Freq: Once | ORAL | Status: DC | PRN

## 2018-12-26 MED ORDER — INSULIN ASPART 100 UNIT/ML ~~LOC~~ SOLN
0.0000 [IU] | Freq: Three times a day (TID) | SUBCUTANEOUS | Status: DC
  Administered 2018-12-27: 8 [IU] via SUBCUTANEOUS
  Administered 2018-12-27: 3 [IU] via SUBCUTANEOUS
  Administered 2018-12-27: 8 [IU] via SUBCUTANEOUS
  Administered 2018-12-28: 11 [IU] via SUBCUTANEOUS
  Administered 2018-12-28: 8 [IU] via SUBCUTANEOUS
  Administered 2018-12-29 – 2018-12-30 (×2): 2 [IU] via SUBCUTANEOUS
  Administered 2018-12-30: 3 [IU] via SUBCUTANEOUS

## 2018-12-26 MED ORDER — SODIUM CHLORIDE 0.9 % IV SOLN
INTRAVENOUS | Status: DC
  Administered 2018-12-26: 05:00:00 via INTRAVENOUS

## 2018-12-26 MED ORDER — CLINDAMYCIN PHOSPHATE 600 MG/50ML IV SOLN
600.0000 mg | Freq: Once | INTRAVENOUS | Status: AC
  Administered 2018-12-26: 600 mg via INTRAVENOUS
  Filled 2018-12-26: qty 50

## 2018-12-26 MED ORDER — LIDOCAINE 2% (20 MG/ML) 5 ML SYRINGE
INTRAMUSCULAR | Status: DC | PRN
  Administered 2018-12-26: 40 mg via INTRAVENOUS
  Administered 2018-12-26: 20 mg via INTRAVENOUS

## 2018-12-26 MED ORDER — CHLORHEXIDINE GLUCONATE CLOTH 2 % EX PADS
6.0000 | MEDICATED_PAD | Freq: Every day | CUTANEOUS | Status: DC
  Administered 2018-12-26 – 2018-12-30 (×4): 6 via TOPICAL

## 2018-12-26 MED ORDER — INSULIN ASPART 100 UNIT/ML ~~LOC~~ SOLN
10.0000 [IU] | Freq: Once | SUBCUTANEOUS | Status: AC
  Administered 2018-12-26: 10 [IU] via SUBCUTANEOUS

## 2018-12-26 MED ORDER — SODIUM CHLORIDE 0.9 % IR SOLN
Status: DC | PRN
  Administered 2018-12-26: 3000 mL

## 2018-12-26 MED ORDER — ONDANSETRON HCL 4 MG/2ML IJ SOLN
INTRAMUSCULAR | Status: AC
  Filled 2018-12-26: qty 2

## 2018-12-26 MED ORDER — PIPERACILLIN-TAZOBACTAM 3.375 G IVPB 30 MIN
3.3750 g | Freq: Once | INTRAVENOUS | Status: AC
  Administered 2018-12-26: 3.375 g via INTRAVENOUS
  Filled 2018-12-26: qty 50

## 2018-12-26 MED ORDER — DEXAMETHASONE SODIUM PHOSPHATE 10 MG/ML IJ SOLN
INTRAMUSCULAR | Status: DC | PRN
  Administered 2018-12-26: 4 mg via INTRAVENOUS

## 2018-12-26 MED ORDER — ACETAMINOPHEN 10 MG/ML IV SOLN: 1000.0000 mg | Freq: Once | INTRAVENOUS | Status: DC | PRN

## 2018-12-26 MED ORDER — ACETAMINOPHEN 325 MG PO TABS
650.0000 mg | ORAL_TABLET | Freq: Once | ORAL | Status: AC
  Administered 2018-12-26: 650 mg via ORAL
  Filled 2018-12-26: qty 2

## 2018-12-26 MED ORDER — MIDAZOLAM HCL 2 MG/2ML IJ SOLN
INTRAMUSCULAR | Status: AC
  Filled 2018-12-26: qty 2

## 2018-12-26 MED ORDER — ONDANSETRON HCL 4 MG PO TABS: 4.0000 mg | ORAL_TABLET | Freq: Four times a day (QID) | ORAL | Status: DC | PRN

## 2018-12-26 MED ORDER — ONDANSETRON HCL 4 MG/2ML IJ SOLN
4.0000 mg | Freq: Four times a day (QID) | INTRAMUSCULAR | Status: DC | PRN
  Administered 2018-12-26: 4 mg via INTRAVENOUS

## 2018-12-26 MED ORDER — PIPERACILLIN-TAZOBACTAM 3.375 G IVPB
3.3750 g | Freq: Three times a day (TID) | INTRAVENOUS | Status: DC
  Administered 2018-12-26 – 2018-12-29 (×10): 3.375 g via INTRAVENOUS
  Filled 2018-12-26 (×12): qty 50

## 2018-12-26 MED ORDER — INSULIN GLARGINE 100 UNIT/ML ~~LOC~~ SOLN
20.0000 [IU] | Freq: Every day | SUBCUTANEOUS | Status: DC
  Administered 2018-12-26: 20 [IU] via SUBCUTANEOUS
  Filled 2018-12-26 (×2): qty 0.2

## 2018-12-26 MED ORDER — SODIUM CHLORIDE 0.9 % IV SOLN
INTRAVENOUS | Status: DC | PRN
  Administered 2018-12-26: 25 ug/min via INTRAVENOUS

## 2018-12-26 MED ORDER — INSULIN REGULAR BOLUS VIA INFUSION
0.0000 [IU] | Freq: Three times a day (TID) | INTRAVENOUS | Status: DC
  Filled 2018-12-26: qty 10

## 2018-12-26 MED ORDER — INSULIN ASPART 100 UNIT/ML ~~LOC~~ SOLN
0.0000 [IU] | Freq: Every day | SUBCUTANEOUS | Status: DC
  Administered 2018-12-27: 4 [IU] via SUBCUTANEOUS
  Administered 2018-12-28: 2 [IU] via SUBCUTANEOUS

## 2018-12-26 MED ORDER — PHENYLEPHRINE HCL (PRESSORS) 10 MG/ML IV SOLN
INTRAVENOUS | Status: AC
  Filled 2018-12-26: qty 1

## 2018-12-26 MED ORDER — ACETAMINOPHEN 160 MG/5ML PO SOLN: 1000.0000 mg | Freq: Once | ORAL | Status: DC | PRN

## 2018-12-26 MED ORDER — PROPOFOL 10 MG/ML IV BOLUS
INTRAVENOUS | Status: AC
  Filled 2018-12-26: qty 20

## 2018-12-26 MED ORDER — BUPIVACAINE HCL (PF) 0.5 % IJ SOLN
INTRAMUSCULAR | Status: AC
  Filled 2018-12-26: qty 30

## 2018-12-26 MED ORDER — SODIUM CHLORIDE 0.9 % IV SOLN: 1.0000 g | INTRAVENOUS | Status: DC

## 2018-12-26 MED ORDER — VANCOMYCIN HCL IN DEXTROSE 1-5 GM/200ML-% IV SOLN
1000.0000 mg | Freq: Every day | INTRAVENOUS | Status: DC
  Administered 2018-12-27 – 2018-12-29 (×3): 1000 mg via INTRAVENOUS
  Filled 2018-12-26 (×2): qty 200

## 2018-12-26 MED ORDER — CHLORHEXIDINE GLUCONATE 4 % EX LIQD: 60.0000 mL | Freq: Once | CUTANEOUS | Status: DC

## 2018-12-26 MED ORDER — FENTANYL CITRATE (PF) 250 MCG/5ML IJ SOLN
INTRAMUSCULAR | Status: AC
  Filled 2018-12-26: qty 5

## 2018-12-26 MED ORDER — VANCOMYCIN HCL 10 G IV SOLR
1250.0000 mg | Freq: Once | INTRAVENOUS | Status: AC
  Administered 2018-12-26: 1250 mg via INTRAVENOUS
  Filled 2018-12-26: qty 1250

## 2018-12-26 MED ORDER — ACETAMINOPHEN 650 MG RE SUPP: 650.0000 mg | Freq: Four times a day (QID) | RECTAL | Status: DC | PRN

## 2018-12-26 MED ORDER — ZINC OXIDE 40 % EX OINT
1.0000 "application " | TOPICAL_OINTMENT | Freq: Two times a day (BID) | CUTANEOUS | Status: DC | PRN
  Administered 2018-12-27 – 2018-12-28 (×3): 1 via TOPICAL
  Filled 2018-12-26 (×2): qty 57

## 2018-12-26 MED ORDER — MIDAZOLAM HCL 2 MG/2ML IJ SOLN
INTRAMUSCULAR | Status: DC | PRN
  Administered 2018-12-26: 1 mg via INTRAVENOUS

## 2018-12-26 MED ORDER — FENTANYL CITRATE (PF) 250 MCG/5ML IJ SOLN
INTRAMUSCULAR | Status: DC | PRN
  Administered 2018-12-26: 25 ug via INTRAVENOUS
  Administered 2018-12-26: 50 ug via INTRAVENOUS

## 2018-12-26 MED ORDER — ACETAMINOPHEN 325 MG PO TABS
650.0000 mg | ORAL_TABLET | Freq: Four times a day (QID) | ORAL | Status: DC | PRN
  Administered 2018-12-26 – 2018-12-27 (×2): 650 mg via ORAL
  Filled 2018-12-26 (×2): qty 2

## 2018-12-26 MED ORDER — INSULIN REGULAR(HUMAN) IN NACL 100-0.9 UT/100ML-% IV SOLN
INTRAVENOUS | Status: DC
  Administered 2018-12-26: 4.5 [IU]/h via INTRAVENOUS
  Filled 2018-12-26 (×2): qty 100

## 2018-12-26 MED ORDER — FENTANYL CITRATE (PF) 100 MCG/2ML IJ SOLN: 25.0000 ug | INTRAMUSCULAR | Status: DC | PRN

## 2018-12-26 MED ORDER — FENTANYL CITRATE (PF) 100 MCG/2ML IJ SOLN
INTRAMUSCULAR | Status: AC
  Filled 2018-12-26: qty 2

## 2018-12-26 SURGICAL SUPPLY — 32 items
BAG ZIPLOCK 12X15 (MISCELLANEOUS) ×2 IMPLANT
BLADE SURG SZ10 CARB STEEL (BLADE) ×2 IMPLANT
BNDG ELASTIC 4X5.8 VLCR STR LF (GAUZE/BANDAGES/DRESSINGS) ×2 IMPLANT
BNDG GAUZE ELAST 4 BULKY (GAUZE/BANDAGES/DRESSINGS) ×2 IMPLANT
COVER SURGICAL LIGHT HANDLE (MISCELLANEOUS) ×2 IMPLANT
COVER WAND RF STERILE (DRAPES) IMPLANT
CUFF TOURN SGL QUICK 18X4 (TOURNIQUET CUFF) ×2 IMPLANT
DRAIN PENROSE 18X1/2 LTX STRL (DRAIN) IMPLANT
DRAPE SURG 17X11 SM STRL (DRAPES) ×4 IMPLANT
DRSG EMULSION OIL 3X3 NADH (GAUZE/BANDAGES/DRESSINGS) ×2 IMPLANT
ELECT REM PT RETURN 15FT ADLT (MISCELLANEOUS) ×2 IMPLANT
GAUZE SPONGE 4X4 12PLY STRL (GAUZE/BANDAGES/DRESSINGS) ×2 IMPLANT
GLOVE SURG ORTHO 8.0 STRL STRW (GLOVE) ×2 IMPLANT
GOWN STRL REUS W/TWL XL LVL3 (GOWN DISPOSABLE) ×2 IMPLANT
IV LACTATED RINGER IRRG 3000ML (IV SOLUTION) ×1
IV LR IRRIG 3000ML ARTHROMATIC (IV SOLUTION) ×1 IMPLANT
KIT BASIN OR (CUSTOM PROCEDURE TRAY) ×2 IMPLANT
KIT TURNOVER KIT A (KITS) ×2 IMPLANT
MANIFOLD NEPTUNE II (INSTRUMENTS) ×2 IMPLANT
PACK ORTHO EXTREMITY (CUSTOM PROCEDURE TRAY) ×2 IMPLANT
PAD CAST 4YDX4 CTTN HI CHSV (CAST SUPPLIES) ×1 IMPLANT
PADDING CAST COTTON 4X4 STRL (CAST SUPPLIES) ×1
PROTECTOR NERVE ULNAR (MISCELLANEOUS) ×2 IMPLANT
SOL PREP PROV IODINE SCRUB 4OZ (MISCELLANEOUS) ×2 IMPLANT
SUT PROLENE 3 0 PS 2 (SUTURE) ×2 IMPLANT
SUT VIC AB 1 CT1 27 (SUTURE) ×1
SUT VIC AB 1 CT1 27XBRD ANTBC (SUTURE) ×1 IMPLANT
SUT VIC AB 2-0 CT1 27 (SUTURE) ×1
SUT VIC AB 2-0 CT1 27XBRD (SUTURE) ×1 IMPLANT
SYR 20ML LL LF (SYRINGE) ×2 IMPLANT
SYR CONTROL 10ML LL (SYRINGE) ×2 IMPLANT
TOWEL OR 17X26 10 PK STRL BLUE (TOWEL DISPOSABLE) ×2 IMPLANT

## 2018-12-26 NOTE — Op Note (Signed)
PREOPERATIVE DIAGNOSIS:Left long finger septic tenosynovitis  POSTOPERATIVE DIAGNOSIS:Left long finger septic tenosynovitis  ATTENDING SURGEON:dr.Suda Forbess who was scrubbed and present for entire procedure  ASSISTANT SURGEON:non  ANESTHESIA:general via lma  OPERATIVE PROCEDURE: 1.left long finger flexor sheath drainage 2.left long finger excisional debridement skin subcutaneous tissue and tendon  IMPLANTS:none  RADIOGRAPHIC INTERPRETATION:none  SURGICAL INDICATIONS: Patient is seen 58 year old right-hand-dominant uncontrolled diabetic and malnourished female who had a several week history of worsening pain in the right long finger.  Patient presented to the emergency department with a serum sugar of 880 and an acidotic state with a worsening infection to the right long finger.  Patient was seen and evaluated and recommended undergo the above procedure.  The risks of surgery include but not limited to bleeding infection damage nearby nerves arteries or tendons loss of finger and need for further surgical intervention.  SURGICAL TECHNIQUE: Patient is palpated find the preoperative holding area marked the permanent marker made on left long finger and indicate correct operative site.  Patient brought back operating placed supine on the anesthesia table where the general anesthetic was administered.  Patient tolerated well.  Well-padded tourniquet placed on the left forearm and seal with the appropriate drape.  Left upper extremities then prepped and draped normal sterile fashion.  Timeout was called the correct site was identified the procedure then begun.  Attention then turned to the left long finger Bruner incision made directly of the left long finger extending with a counterincision in the palm.  Once the skin was incised gross purulence was throughout the entire flexor sheath from the palm all the way out to the fingertip.  The entire flexor sheath was then opened up.  Drainage of the flexor  sheath was done extensively.  Patient also had the devitalized tissue and with sharp scissors and knife excisional debridement of skin subcutaneous tissue and devitalized tendon was then carefully removed.  Thorough wound irrigation done.  After excisional debridement and flexor sheath drainage counterincision was made dorsally where patient also purulence coming from the dorsal extensor apparatus.  There is an open up and then drained dorsally.  After this was thoroughly irrigated and debrided the skin was loosely closed and reapproximated with 3-0 Prolene suture.  Intraoperative wound cultures have been taken.  Adaptic dressing a sterile compressive bandage then applied.  The patient tolerated the procedure well extubated taken recovery room in good condition.  POSTOPERATIVE PLAN: The patient has a significant infection of the finger.  She is malnourished she has wounds throughout the lower perineal region sacral region.  Her diabetes is not under control.  Her albumin is poor.  Do not anticipate her being able to heal the wound without glycemic control and adequate protein levels.  More likely than not the patient is going to require amputation of the left long finger.  The plan will be to take her back to the operating room on Saturday reassess the soft tissues.  Will discuss with the patient about removal of the middle finger.  My concern even with removal of the middle finger is her ability to heal any wound.  She will need to be seen by the wound care nurses in the hospital for her lower perineal and sacral region.  Dry dressings applied to the finger until she returns back to the operating room on Saturday.  She remained an inpatient on the hospitalist service.

## 2018-12-26 NOTE — H&P (Addendum)
History and Physical    Gina [name] [id number] DOB: [birth year] DOA: 12/25/2018  PCP: Alfonse Spruce, FNP  Patient coming from: Home.  Chief Complaint: Left hand swelling.  HPI: Gina [name] is a 58 y.o. female with history of diabetes mellitus type 2 bilateral BKA, schizophrenia with history of poor adherence to medication who was recently admitted to hospital for hyperosmolar nonketotic uncontrolled diabetes and discharged on November 28, 2018 at the time hemoglobin A1c was found to be around 15 was brought to the ER after patient noticed increasing swelling of the left hand particularly middle finger which patient thought may have had a insect bite.  EMS on arrival was found to have very elevated blood sugar.  Patient otherwise did not complain of any chest pain shortness of breath nausea vomiting or diarrhea.  ED Course: In the ER x-rays revealed diffuse soft tissue swelling of the left hand with some air around the middle phalanx of the third finger.  COVID-19 test was negative.  Blood sugar was 880 with anion gap of 15 sodium 121 creatinine 1.8 hemoglobin 9.9 WBC 17,000 lactic acid 1.3.  On exam patient appears mildly lethargic but arousable and follows commands.  Per ER physician patient was alert awake at the time of admission.  ER physician I discussed with on-call orthopedic surgeon Dr. Apolonio Schneiders will be seeing patient in consult and likely to take to the OR.  Empiric antibiotics were started after blood cultures obtained.  Since patient blood sugar still elevated started patient on IV insulin infusion.  Review of Systems: As per HPI, rest all negative.   Past Medical History:  Diagnosis Date  . Diabetes mellitus   . Hypertension   . Osteomyelitis of right foot ([place]) 02/22/2017  . Schizophrenia ([place])   . Septic arthritis of interphalangeal joint of toe of left foot ([place]) 06/02/2016  . Stress incontinence 04/26/2017    Past Surgical History:  Procedure Laterality  Date  . AMPUTATION Left 06/05/2016   Procedure: LEFT FOOT TRANSMETATARSAL AMPUTATION;  Surgeon: [name], MD;  Location: [facility];  Service: Orthopedics;  Laterality: Left;  . AMPUTATION Right 11/11/2017   Procedure: AMPUTATION BELOW KNEE;  Surgeon: [name], MD;  Location: [facility];  Service: Orthopedics;  Laterality: Right;  . AMPUTATION Left 05/10/2018   Procedure: LEFT BELOW KNEE AMPUTATION;  Surgeon: [name], MD;  Location: [facility];  Service: Orthopedics;  Laterality: Left;  . AMPUTATION TOE Right 02/23/2017   Procedure: AMPUTATION RIGHT THIRD TOE;  Surgeon: [name], MD;  Location: [facility];  Service: Orthopedics;  Laterality: Right;  . I&D EXTREMITY Right 11/09/2017   Procedure: Debride Ulcer Right Heel;  Surgeon: [name], MD;  Location: [facility];  Service: Orthopedics;  Laterality: Right;  . TUBAL LIGATION       reports that she has never smoked. She has never used smokeless tobacco. She reports that she does not drink alcohol or use drugs.  Allergies  Allergen Reactions  . Metformin And Related Other (See Comments)    Upset stomach    Family History  Problem Relation Age of Onset  . Diabetes type II Father   . CAD Father   . Prostate cancer Father   . Diabetes Mellitus II Brother     Prior to Admission medications   Medication Sig Start Date End Date Taking? Authorizing Provider  acetaminophen (TYLENOL) 325 MG tablet Take 2 tablets (650 mg total) by mouth every 6 (six) hours as needed  for mild pain (or Fever >/= 101). 05/21/18  Yes Roney Jaffe, MD  amLODipine (NORVASC) 5 MG tablet Take 1 tablet (5 mg total) by mouth daily. 11/29/18 12/29/18 Yes Spongberg, Audie Pinto, MD  escitalopram (LEXAPRO) 20 MG tablet TAKE ONE TABLET BY MOUTH EVERY DAY Patient taking differently: Take 20 mg by mouth daily.  04/09/18  Yes Charlott Rakes, MD  gabapentin (NEURONTIN) 100 MG capsule Take 2 capsules (200 mg total) by mouth 2 (two) times daily. 11/28/18 12/28/18 Yes  Spongberg, Audie Pinto, MD  insulin aspart (NOVOLOG) 100 UNIT/ML injection Inject 0-15 Units into the skin 3 (three) times daily with meals. 05/21/18  Yes Roney Jaffe, MD  insulin glargine (LANTUS) 100 UNIT/ML injection Inject 0.25 mLs (25 Units total) into the skin daily. 05/22/18  Yes Roney Jaffe, MD  polyethylene glycol [facility]) packet Take 17 g by mouth daily as needed for mild constipation. 05/21/18  Yes Roney Jaffe, MD  senna-docusate (SENOKOT-S) 8.6-50 MG tablet Take 2 tablets by mouth at bedtime as needed for mild constipation. 05/21/18  Yes Roney Jaffe, MD  dextrose 50 % solution Inject 25 mLs (12.5 g total) into the vein as needed for low blood sugar. 05/21/18   Roney Jaffe, MD  linagliptin (TRADJENTA) 5 MG TABS tablet Take 1 tablet (5 mg total) by mouth daily. Patient not taking: Reported on 12/26/2018 05/22/18   Roney Jaffe, MD  perphenazine (TRILAFON) 2 MG tablet Take 1 tablet (2 mg total) by mouth 2 (two) times daily. Patient not taking: Reported on 12/26/2018 05/21/18   Roney Jaffe, MD    Physical Exam: Constitutional: Moderately built and nourished. Vitals:   12/26/18 0200 12/26/18 0230 12/26/18 0300 12/26/18 0330  BP: 134/89 130/68 107/61 (!) 109/56  Pulse: 95 95 93 91  Resp:  14    Temp:      TempSrc:      SpO2: 98% 99% 100% 94%   Eyes: Anicteric no pallor. ENMT: No discharge from the ears eyes nose or mouth. Neck: No mass felt but no neck rigidity. Respiratory: No rhonchi or crepitations. Cardiovascular: S1-S2 heard. Abdomen: Soft nontender bowel sound present. Musculoskeletal: Swelling of the left hand. Skin: Mild erythema of the left hand. Neurologic: Patient is lethargic but arousable and follows commands.  Pupils equal reacting to light. Psychiatric: Mildly lethargic.   Labs on Admission: I have personally reviewed following labs and imaging studies  CBC: Recent Labs  Lab 12/26/18 0109  WBC 17.0*  NEUTROABS 14.2*  HGB 9.9*   HCT 30.7*  MCV 93.0  PLT 123456   Basic Metabolic Panel: Recent Labs  Lab 12/26/18 0109  NA 121*  K 4.6  CL 82*  CO2 24  GLUCOSE 880*  BUN 9  CREATININE 0.81  CALCIUM 8.6*   GFR: Estimated Creatinine Clearance: 60.6 mL/min (by C-G formula based on SCr of 0.81 mg/dL). Liver Function Tests: Recent Labs  Lab 12/26/18 0109  AST 14*  ALT 18  ALKPHOS 121  BILITOT 1.1  PROT 6.7  ALBUMIN 2.4*   No results for input(s): LIPASE, AMYLASE in the last 168 hours. No results for input(s): AMMONIA in the last 168 hours. Coagulation Profile: No results for input(s): INR, PROTIME in the last 168 hours. Cardiac Enzymes: No results for input(s): CKTOTAL, CKMB, CKMBINDEX, TROPONINI in the last 168 hours. BNP (last 3 results) No results for input(s): PROBNP in the last 8760 hours. HbA1C: No results for input(s): HGBA1C in the last 72 hours. CBG: Recent Labs  Lab 12/25/18 2317 12/26/18 0113  12/26/18 0249 12/26/18 0514  GLUCAP >600* >600* 580* 506*   Lipid Profile: No results for input(s): CHOL, HDL, LDLCALC, TRIG, CHOLHDL, LDLDIRECT in the last 72 hours. Thyroid Function Tests: No results for input(s): TSH, T4TOTAL, FREET4, T3FREE, THYROIDAB in the last 72 hours. Anemia Panel: No results for input(s): VITAMINB12, FOLATE, FERRITIN, TIBC, IRON, RETICCTPCT in the last 72 hours. Urine analysis:    Component Value Date/Time   COLORURINE YELLOW 05/09/2018 [place] 05/09/2018 0653   LABSPEC 1.018 05/09/2018 0653   PHURINE 5.0 05/09/2018 0653   GLUCOSEU >=500 (A) 05/09/2018 0653   HGBUR MODERATE (A) 05/09/2018 0653   BILIRUBINUR NEGATIVE 05/09/2018 0653   BILIRUBINUR N 04/28/2016 1603   KETONESUR 80 (A) 05/09/2018 0653   PROTEINUR 30 (A) 05/09/2018 0653   UROBILINOGEN 0.2 04/28/2016 1603   UROBILINOGEN 0.2 06/18/2013 0650   NITRITE NEGATIVE 05/09/2018 0653   LEUKOCYTESUR NEGATIVE 05/09/2018 0653   Sepsis Labs: @LABRCNTIP (procalcitonin:4,lacticidven:4) )  Recent Results (from the past 240 hour(s))  Blood culture (routine x 2)     Status: None (Preliminary result)   Collection Time: 12/26/18 12:18 AM   Specimen: BLOOD LEFT FOREARM  Result Value Ref Range Status   Specimen Description   Final    BLOOD LEFT FOREARM Performed at [facility], [place] [address], [place], [place] [address]    Special Requests   Final    BOTTLES DRAWN AEROBIC AND ANAEROBIC Blood Culture results may not be optimal due to an inadequate volume of blood received in culture bottles Performed at [facility] [address], [place], [place] [address]    Culture PENDING  Incomplete   Report Status PENDING  Incomplete  SARS Coronavirus 2 by RT PCR (hospital order, performed in [facility] hospital lab) Nasopharyngeal Nasopharyngeal Swab     Status: None   Collection Time: 12/26/18  2:05 AM   Specimen: Nasopharyngeal Swab  Result Value Ref Range Status   SARS Coronavirus 2 NEGATIVE NEGATIVE Final    Comment: (NOTE) If result is NEGATIVE SARS-CoV-2 target nucleic acids are NOT DETECTED. The SARS-CoV-2 RNA is generally detectable in upper and lower  respiratory specimens during the acute phase of infection. The lowest  concentration of SARS-CoV-2 viral copies this assay can detect is 250  copies / mL. A negative result does not preclude SARS-CoV-2 infection  and should not be used as the sole basis for treatment or other  patient management decisions.  A negative result may occur with  improper specimen collection / handling, submission of specimen other  than nasopharyngeal swab, presence of viral mutation(s) within the  areas targeted by this assay, and inadequate number of viral copies  (<250 copies / mL). A negative result must be combined with clinical  observations, patient history, and epidemiological information. If result is POSITIVE SARS-CoV-2 target nucleic acids are DETECTED. The SARS-CoV-2 RNA is generally detectable in upper and  lower  respiratory specimens dur ing the acute phase of infection.  Positive  results are indicative of active infection with SARS-CoV-2.  Clinical  correlation with patient history and other diagnostic information is  necessary to determine patient infection status.  Positive results do  not rule out bacterial infection or co-infection with other viruses. If result is PRESUMPTIVE POSTIVE SARS-CoV-2 nucleic acids MAY BE PRESENT.   A presumptive positive result was obtained on the submitted specimen  and confirmed on repeat testing.  While 2019 novel coronavirus  (SARS-CoV-2) nucleic acids may be present in  the submitted sample  additional confirmatory testing may be necessary for epidemiological  and / or clinical management purposes  to differentiate between  SARS-CoV-2 and other Sarbecovirus currently known to infect humans.  If clinically indicated additional testing with an alternate test  methodology [phone]) is advised. The SARS-CoV-2 RNA is generally  detectable in upper and lower respiratory sp ecimens during the acute  phase of infection. The expected result is Negative. Fact Sheet for Patients:  StrictlyIdeas.no Fact Sheet for Healthcare Providers: BankingDealers.co.za This test is not yet approved or cleared by the [place] FDA and has been authorized for detection and/or diagnosis of SARS-CoV-2 by FDA under an Emergency Use Authorization (EUA).  This EUA will remain in effect (meaning this test can be used) for the duration of the COVID-19 declaration under Section 564(b)(1) of the Act, 21 U.S.C. section 360bbb-3(b)(1), unless the authorization is terminated or revoked sooner. Performed at [facility], [place] [address], [place], [place] [address]      Radiological Exams on Admission: Dg Hand Complete Left  Result Date: 12/26/2018 CLINICAL DATA:  Left hand infection EXAM: LEFT HAND - COMPLETE 3+  VIEW COMPARISON:  Radiograph 11/27/2011 FINDINGS: There is diffuse soft tissue swelling of the left hand most notably about the third digit. Foci of soft tissue gas are noted along the volar aspect of the third middle phalanx. No radiopaque foreign body is seen. No abnormal destructive change, cortical lucency, erosion or periostitis to suggest early radiographic features of osteomyelitis. Mild arthrosis of the interphalangeal joints, first carpometacarpal joint and triscaphe joint. IMPRESSION: 1. Diffuse soft tissue swelling of the left hand most notably about the third digit. 2. Foci of soft tissue gas along the volar aspect of the third middle phalanx. No radiographic evidence of osteomyelitis. 3. Mild arthrosis changes, as detailed above. Electronically Signed   By: Lovena Le M.D.   On: 12/26/2018 01:21     Assessment/Plan Principal Problem:   Cellulitis of hand, left Active Problems:   Anemia of chronic disease   Schizophrenia (HCC)   Type 2 diabetes mellitus with hyperosmolar nonketotic hyperglycemia ([place])    1. Cellulitis of the left hand particular involving the left middle finger -Dr. Apolonio Schneiders hand surgeon has been consulted.  Patient is on empiric antibiotics will keep patient n.p.o. in anticipation of procedure.  Follow blood cultures. 2. Hyperosmolar uncontrolled diabetes mellitus -as started patient on IV insulin infusion.  Follow metabolic panel CBGs.  Last hemoglobin A1c last month was 15. 3. Anemia of chronic disease with recent worsening.  Follow CBC.  Type and screen. 4. History of schizophrenia presently n.p.o.  Appears mildly lethargic.  See #5. 5. Lethargy -we will check ABG and ammonia levels.  Closely follow mental status. 6. Bilateral BKA. 7. Severe protein calorie malnutrition -will need nutrition consult once patient more stable.  Given that patient has possible infection of the left hand with uncontrolled diabetes will need more than 2 midnight stay in inpatient  status.   DVT prophylaxis: SCDs in anticipation of procedure. Code Status: Full code. Family Communication: We will need to discuss with family. Disposition Plan: To be determined. Consults called: Copy. Admission status: Inpatient.   [name] MD [facility] Pager [phone].  If 7PM-7AM, please contact night-coverage [email or web address] Password [email or web address]  12/26/2018, 5:54 AM

## 2018-12-26 NOTE — Progress Notes (Signed)
CRITICAL VALUE ALERT  Critical Value:  Lactic Acid 3.5  Date & Time Notied: 12/26/2018, [id number]  Provider Notified: Dhungel, MD   Orders Received/Actions taken: MD came to beside and increased NS to 125/hr

## 2018-12-26 NOTE — Transfer of Care (Signed)
Immediate Anesthesia Transfer of Care Note  Patient: Gina [name]  Procedure(s) Performed: IRRIGATION AND DEBRIDEMENT LEFT HAND (Left Hand)  Patient Location: PACU  Anesthesia Type:General  Level of Consciousness: awake and drowsy  Airway & Oxygen Therapy: Patient Spontanous Breathing and Patient connected to face mask oxygen  Post-op Assessment: Report given to RN and Post -op Vital signs reviewed and stable  Post vital signs: Reviewed and stable  Last Vitals:  Vitals Value Taken Time  BP 128/75 12/26/18 1500  Temp    Pulse 68 12/26/18 1501  Resp 14 12/26/18 1503  SpO2 100 % 12/26/18 1501  Vitals shown include unvalidated device data.  Last Pain:  Vitals:   12/26/18 1301  TempSrc:   PainSc: 10-Worst pain ever      Patients Stated Pain Goal: 3 ([id number])  Complications: No apparent anesthesia complications

## 2018-12-26 NOTE — Progress Notes (Signed)
PROGRESS NOTE                                                                                                                                                                                                             Patient Demographics:    Gina Gina [name], Gina a 58 y.o. Gina [name], DOB - [birth year], OMB:[id number]  Admit date - 12/25/2018   Admitting Physician [name], MD  Outpatient Primary MD for the [name] Alfonse Spruce, FNP  LOS - 0  Outpatient Specialists:None  No chief complaint on file.      Brief Narrative 58 year old Gina [name] with uncontrolled type 2 diabetes mellitus (A1c of 15), bilateral BKA, poor adherence to insulin, schizophrenia, with hospitalization 1 month back with hyperosmolar nonketotic hyperglycemia presented to the ER with increased swelling in her left hand (involving middle finger for past few days).  She thought she may have had an insect bite.  EMS found her to have significantly elevated blood glucose.  Patient was lethargic upon admission and did not provide much history but denied any nausea, vomiting, dizziness, chest pain, shortness of breath, fever or diarrhea. In the ED blood glucose was 880 with anion gap of 15, sodium of 121, creatinine of 1.8.  CBC showed WBC of 17 K with lactic acid of 1.3 and hemoglobin of 9.9. Patient given normal saline bolus and started on insulin drip for nonketotic hyperosmolar state and placed on empiric IV vancomycin and Zosyn.  X-ray of the left hand show diffuse soft tissue swelling of the middle finger. Admitted to stepdown unit.  Patient met criteria for sepsis.  Subjective:   Patient sleepy but arousable this morning during my evaluation.  Noted to have elevated lactic acid of 3.5 on labs.  Anion gap closed on subsequent BMET . later this morning she was awake and oriented.   Assessment  & Plan :    Principal Problem: Sepsis secondary to  cellulitis of the left hand. Leukocytosis, elevated lactic acid and metabolic encephalopathy.  On empiric IV vancomycin and Zosyn.  Received IV normal saline aggressively, switched to D5 with half-normal saline. Hand surgery consulted and plan on OR this afternoon for I&D.  Keep n.p.o.  Pain control as needed.  Uncontrolled 2 diabetes mellitus with hyperosmolar nonketotic state Presented with blood glucose in the 800s with hyperosmolar hyperglycemia and metabolic  acidosis.  Was lethargic on presentation.  Received aggressive IV hydration and on IV insulin drip.  Anion gap has closed and blood glucose in 200s now.  [name] n.p.o. for surgery so I will continue IV insulin at low-dose until she Gina able to take p.o.  Switch fluids to D5 with half-normal saline.  Recheck BMP in 4 hours Issue with insulin nonadherence.  Last A1c was 15. Diabetic coordinator consulted.   Active Problems: Acute metabolic encephalopathy Secondary to sepsis and hyperosmolar state.  Currently resolved.  History of schizophrenia Currently n.p.o.  Resume home meds later today.  Anemia of chronic disease Hemoglobin at baseline.  Monitor closely.      Code Status : Full code  Family Communication  : None  Disposition Plan  : Likely home pending hospital course  Barriers For Discharge : Active symptoms  Consults  : Hand surgery (Dr. Vallarie Mare)  Procedures  : None  DVT Prophylaxis  :  Lovenox  Lab Results  Component Value Date   PLT 347 12/26/2018    Antibiotics  :  Anti-infectives (From admission, onward)   Start     Dose/Rate Route Frequency Ordered Stop   12/27/18 0300  vancomycin (VANCOCIN) IVPB 1000 mg/200 mL premix     1,000 mg 200 mL/hr over 60 Minutes Intravenous Daily 12/26/18 0618     12/26/18 1000  piperacillin-tazobactam (ZOSYN) IVPB 3.375 g     3.375 g 12.5 mL/hr over 240 Minutes Intravenous Every 8 hours 12/26/18 0618     12/26/18 0200  piperacillin-tazobactam (ZOSYN) IVPB 3.375 g      3.375 g 100 mL/hr over 30 Minutes Intravenous  Once 12/26/18 0146 12/26/18 0310   12/26/18 0145  clindamycin (CLEOCIN) IVPB 600 mg     600 mg 100 mL/hr over 30 Minutes Intravenous  Once 12/26/18 0131 12/26/18 0253   12/26/18 0045  vancomycin (VANCOCIN) 1,250 mg in sodium chloride 0.9 % 250 mL IVPB     1,250 mg 166.7 mL/hr over 90 Minutes Intravenous  Once 12/26/18 0030 12/26/18 0252   12/26/18 0030  cefTRIAXone (ROCEPHIN) 1 g in sodium chloride 0.9 % 100 mL IVPB  Status:  Discontinued     1 g 200 mL/hr over 30 Minutes Intravenous Every 24 hours 12/26/18 0018 12/26/18 0130        Objective:   Vitals:   12/26/18 0800 12/26/18 0900 12/26/18 1000 12/26/18 1014  BP: (!) 117/59 127/72 (!) 131/91   Pulse: 72 73 72   Resp: _0 Temp: 97.6 F (36.4 C)     TempSrc: Oral     SpO2: 98% 100% 92%   Weight:    57.9 kg  Height:    _1  (1.575 m)    Wt Readings from Last 3 Encounters:  12/26/18 57.9 kg  12/17/18 54.8 kg  11/25/18 54.8 kg     Intake/Output Summary (Last 24 hours) at 12/26/2018 1027 Last data filed at 12/26/2018 0813 Gross per 24 hour  Intake 2574.65 ml  Output -  Net 2574.65 ml     Physical Exam  Gen: not in distress, arousable, confused HEENT: no pallor, dry mucosa, supple neck Chest: clear b/l, no added sounds CVS: N S1&S2, no murmurs, rubs or gallop GI: soft, NT, ND, BS+ Musculoskeletal: warm, no edema, bilateral BKA CNS: Awake to commands, oriented x2    Data Review:    CBC Recent Labs  Lab 12/26/18 0109 12/26/18 0644  WBC 17.0* 17.8*  HGB 9.9* 9.5*  HCT 30.7* 28.7*  PLT 375 347  MCV 93.0 93.2  MCH 30.0 30.8  MCHC 32.2 33.1  RDW 12.0 12.1  LYMPHSABS 1.3  --   MONOABS 1.4*  --   EOSABS 0.0  --   BASOSABS 0.0  --     Chemistries  Recent Labs  Lab 12/26/18 0109 12/26/18 0644 12/26/18 0954  NA 121* 129* 131*  K 4.6 3.7 3.6  CL 82* 93* 95*  CO2 _0 GLUCOSE 880* 479* 195*  BUN _1 CREATININE 0.81 0.69 0.49   CALCIUM 8.6* 8.5* 8.4*  AST 14*  --   --   ALT 18  --   --   ALKPHOS 121  --   --   BILITOT 1.1  --   --    ------------------------------------------------------------------------------------------------------------------ No results for input(s): CHOL, HDL, LDLCALC, TRIG, CHOLHDL, LDLDIRECT in the last 72 hours.  Lab Results  Component Value Date   HGBA1C >15.5 (H) 11/25/2018   ------------------------------------------------------------------------------------------------------------------ No results for input(s): TSH, T4TOTAL, T3FREE, THYROIDAB in the last 72 hours.  Invalid input(s): FREET3 ------------------------------------------------------------------------------------------------------------------ No results for input(s): VITAMINB12, FOLATE, FERRITIN, TIBC, IRON, RETICCTPCT in the last 72 hours.  Coagulation profile No results for input(s): INR, PROTIME in the last 168 hours.  No results for input(s): DDIMER in the last 72 hours.  Cardiac Enzymes No results for input(s): CKMB, TROPONINI, MYOGLOBIN in the last 168 hours.  Invalid input(s): CK ------------------------------------------------------------------------------------------------------------------    Component Value Date/Time   BNP 19.0 04/28/2016 1640    Inpatient Medications  Scheduled Meds: . Chlorhexidine Gluconate Cloth  6 each Topical Daily  . insulin regular  0-10 Units Intravenous TID WC   Continuous Infusions: . sodium chloride Stopped (12/26/18 0853)  . dextrose 5 % and 0.45% NaCl 125 mL/hr at 12/26/18 0856  . insulin 4.1 Units/hr (12/26/18 1012)  . piperacillin-tazobactam (ZOSYN)  IV 3.375 g (12/26/18 0907)  . [START ON 12/27/2018] vancomycin     PRN Meds:.acetaminophen **OR** acetaminophen, dextrose, liver oil-zinc oxide, ondansetron **OR** ondansetron (ZOFRAN) IV  Micro Results Recent Results (from the past 240 hour(s))  Blood culture (routine x 2)     Status: None (Preliminary  result)   Collection Time: 12/26/18 12:18 AM   Specimen: BLOOD LEFT FOREARM  Result Value Ref Range Status   Specimen Description   Final    BLOOD LEFT FOREARM Performed at [facility], [place] [address] [place], [place] [address]    Special Requests   Final    BOTTLES DRAWN AEROBIC AND ANAEROBIC Blood Culture results may not be optimal due to an inadequate volume of blood received in culture bottles Performed at [facility] [address], [place], [place] [address]    Culture PENDING  Incomplete   Report Status PENDING  Incomplete  SARS Coronavirus 2 by RT PCR (hospital order, performed in [facility] hospital lab) Nasopharyngeal Nasopharyngeal Swab     Status: None   Collection Time: 12/26/18  2:05 AM   Specimen: Nasopharyngeal Swab  Result Value Ref Range Status   SARS Coronavirus 2 NEGATIVE NEGATIVE Final    Comment: (NOTE) If result Gina NEGATIVE SARS-CoV-2 target nucleic acids are NOT DETECTED. The SARS-CoV-2 RNA Gina generally detectable in upper and lower  respiratory specimens during the acute phase of infection. The lowest  concentration of SARS-CoV-2 viral copies this assay can detect Gina 250  copies / mL. A negative result does not preclude SARS-CoV-2 infection  and should not  be used as the sole basis for treatment or other  patient management decisions.  A negative result may occur with  improper specimen collection / handling, submission of specimen other  than nasopharyngeal swab, presence of viral mutation(s) within the  areas targeted by this assay, and inadequate number of viral copies  (<250 copies / mL). A negative result must be combined with clinical  observations, patient history, and epidemiological information. If result Gina POSITIVE SARS-CoV-2 target nucleic acids are DETECTED. The SARS-CoV-2 RNA Gina generally detectable in upper and lower  respiratory specimens dur ing the acute phase of infection.  Positive  results are indicative  of active infection with SARS-CoV-2.  Clinical  correlation with patient history and other diagnostic information Gina  necessary to determine patient infection status.  Positive results do  not rule out bacterial infection or co-infection with other viruses. If result Gina PRESUMPTIVE POSTIVE SARS-CoV-2 nucleic acids MAY BE PRESENT.   A presumptive positive result was obtained on the submitted specimen  and confirmed on repeat testing.  While 2019 novel coronavirus  (SARS-CoV-2) nucleic acids may be present in the submitted sample  additional confirmatory testing may be necessary for epidemiological  and / or clinical management purposes  to differentiate between  SARS-CoV-2 and other Sarbecovirus currently known to infect humans.  If clinically indicated additional testing with an alternate test  methodology [phone]) Gina advised. The SARS-CoV-2 RNA Gina generally  detectable in upper and lower respiratory sp ecimens during the acute  phase of infection. The expected result Gina Negative. Fact Sheet for Patients:  StrictlyIdeas.no Fact Sheet for Healthcare Providers: BankingDealers.co.za This test Gina not yet approved or cleared by the [place] FDA and has been authorized for detection and/or diagnosis of SARS-CoV-2 by FDA under an Emergency Use Authorization (EUA).  This EUA will remain in effect (meaning this test can be used) for the duration of the COVID-19 declaration under Section 564(b)(1) of the Act, 21 U.S.C. section 360bbb-3(b)(1), unless the authorization Gina terminated or revoked sooner. Performed at [facility], [place] [address] [place], [place] [address]     Radiology Reports Dg Hand Complete Left  Result Date: 12/26/2018 CLINICAL DATA:  Left hand infection EXAM: LEFT HAND - COMPLETE 3+ VIEW COMPARISON:  Radiograph 11/27/2011 FINDINGS: There Gina diffuse soft tissue swelling of the left hand most notably  about the third digit. Foci of soft tissue gas are noted along the volar aspect of the third middle phalanx. No radiopaque foreign body Gina seen. No abnormal destructive change, cortical lucency, erosion or periostitis to suggest early radiographic features of osteomyelitis. Mild arthrosis of the interphalangeal joints, first carpometacarpal joint and triscaphe joint. IMPRESSION: 1. Diffuse soft tissue swelling of the left hand most notably about the third digit. 2. Foci of soft tissue gas along the volar aspect of the third middle phalanx. No radiographic evidence of osteomyelitis. 3. Mild arthrosis changes, as detailed above. Electronically Signed   By: Lovena Le M.D.   On: 12/26/2018 01:21    Time Spent in minutes  25   Angeleigh Chiasson M.D on 12/26/2018 at 10:27 AM  Between 7am to 7pm - Pager - [phone]  After 7pm go to [email or web address] - password [email or web address]  [facility] -  Office  [phone]

## 2018-12-26 NOTE — Progress Notes (Signed)
Pharmacy Antibiotic Note  Gina [name] is a 58 y.o. female admitted on 12/25/2018 with cellulitis.  Pharmacy has been consulted for zosyn and vancomycin dosing.  Plan: Zosyn 3.375g IV q8h (4 hour infusion).  Vancomycin 1250 mg x1 then 1 Gm IV q24h for eat AUC = 463 Daily scr Goal auc = 400-550 F/u cultures/levels     Temp (24hrs), Avg:98.6 F (37 C), Min:98.1 F (36.7 C), Max:99 F (37.2 C)  Recent Labs  Lab 12/26/18 0109  WBC 17.0*  CREATININE 0.81  LATICACIDVEN 1.3    Estimated Creatinine Clearance: 60.6 mL/min (by C-G formula based on SCr of 0.81 mg/dL).    Allergies  Allergen Reactions  . Metformin And Related Other (See Comments)    Upset stomach    Antimicrobials this admission: 10/15 zosyn >>  10/15 vancomycin >>   Dose adjustments this admission:   Microbiology results:  BCx:   UCx:    Sputum:    MRSA PCR:   Thank you for allowing pharmacy to be a part of this patient's care.  Dorrene German 12/26/2018 6:04 AM

## 2018-12-26 NOTE — Progress Notes (Signed)
tHE CHART WAS REVIEWED tHE PLAN WILL BE FOR IRRIGATION AND DEBRIDEMENT OF THE HAND TODAY wE WILL TALK WITH THE MEDICINE STAFF ABOUT HER GLYCEMIC CONTROL AND ACIDOSIS sHE NEEDS TO HAVE THE FINGER OPEN AND DRAINED sHE WILL NEED TO CONTINUE ON iv ANTIBIOTICS gIVEN HER POOR NUTRITION AND POOR GLYCEMIC CONTROL cONTINUE NOTHING BY MOUTH FOR SURGERY TODAY

## 2018-12-26 NOTE — ED Notes (Signed)
ED TO INPATIENT HANDOFF REPORT  ED Nurse Name and Phone #: Fanny Skates [phone]  S Name/Age/Gender Gina [name] 58 y.o. female Room/Bed: WA06/WA06  Code Status   Code Status: Prior  Home/SNF/Other Home Patient oriented to: self Is this baseline? Yes   Triage Complete: Triage complete  Chief Complaint Allergic Reaction on Hand  Triage Note Pt is from home via [facility],  middle finger swollen, thinks she was bitten by something. Ongoing for about two weeks. CBG reading "high" for [facility], pt cannot remember if she has been taking her insulin. 112/42, hr 96, 98% RA, oriented.    Allergies Allergies  Allergen Reactions  . Metformin And Related Other (See Comments)    Upset stomach    Level of Care/Admitting Diagnosis ED Disposition    ED Disposition Condition Comment   Admit  Hospital Area: [facility] [[id number]]  Level of Care: Stepdown [14]  Admit to SDU based on following criteria: Severe physiological/psychological symptoms:  Any diagnosis requiring assessment & intervention at least every 4 hours on an ongoing basis to obtain desired patient outcomes including stability and rehabilitation  Covid Evaluation: N/A  Diagnosis: Cellulitis of hand, left [id number]  Admitting Physician: [name] [phone]  Attending Physician: [name] [phone]  Estimated length of stay: past midnight tomorrow  Certification:: I certify this patient will need inpatient services for at least 2 midnights  PT Class (Do Not Modify): Inpatient [101]  PT Acc Code (Do Not Modify): Private [1]       B Medical/Surgery History Past Medical History:  Diagnosis Date  . Diabetes mellitus   . Hypertension   . Osteomyelitis of right foot ([place]) 02/22/2017  . Schizophrenia ([place])   . Septic arthritis of interphalangeal joint of toe of left foot ([place]) 06/02/2016  . Stress incontinence 04/26/2017   Past Surgical History:  Procedure Laterality Date  . AMPUTATION Left  06/05/2016   Procedure: LEFT FOOT TRANSMETATARSAL AMPUTATION;  Surgeon: [name], MD;  Location: [facility];  Service: Orthopedics;  Laterality: Left;  . AMPUTATION Right 11/11/2017   Procedure: AMPUTATION BELOW KNEE;  Surgeon: [name], MD;  Location: [facility];  Service: Orthopedics;  Laterality: Right;  . AMPUTATION Left 05/10/2018   Procedure: LEFT BELOW KNEE AMPUTATION;  Surgeon: [name], MD;  Location: [facility];  Service: Orthopedics;  Laterality: Left;  . AMPUTATION TOE Right 02/23/2017   Procedure: AMPUTATION RIGHT THIRD TOE;  Surgeon: [name], MD;  Location: [facility];  Service: Orthopedics;  Laterality: Right;  . I&D EXTREMITY Right 11/09/2017   Procedure: Debride Ulcer Right Heel;  Surgeon: [name], MD;  Location: [facility];  Service: Orthopedics;  Laterality: Right;  . TUBAL LIGATION       A IV Location/Drains/Wounds Patient Lines/Drains/Airways Status   Active Line/Drains/Airways    Name:   Placement date:   Placement time:   Site:   Days:   Peripheral IV 12/26/18 Right Antecubital   12/26/18    0044    Antecubital   less than 1   Peripheral IV 12/26/18 Left Forearm   12/26/18    0046    Forearm   less than 1   External Urinary Catheter   07/29/18    1441    -   150   Incision (Closed) 05/10/18 Leg Left   05/10/18    1255     230   Pressure Injury 11/25/18 Sacrum Stage II -  Partial thickness loss of dermis  presenting as a shallow open ulcer with a red, pink wound bed without slough. Pressure injury sacrum   11/25/18    1600     31          Intake/Output Last 24 hours  Intake/Output Summary (Last 24 hours) at 12/26/2018 0543 Last data filed at 12/26/2018 0358 Gross per 24 hour  Intake 2346.12 ml  Output -  Net 2346.12 ml    Labs/Imaging Results for orders placed or performed during the hospital encounter of 12/25/18 (from the past 48 hour(s))  POC CBG, ED     Status: Abnormal   Collection Time: 12/25/18 11:17 PM  Result Value Ref Range    Glucose-Capillary >600 (HH) 70 - 99 mg/dL   Comment 1 Notify RN   Blood culture (routine x 2)     Status: None (Preliminary result)   Collection Time: 12/26/18 12:18 AM   Specimen: BLOOD LEFT FOREARM  Result Value Ref Range   Specimen Description      BLOOD LEFT FOREARM Performed at [facility] [address] [place], [place] [address]    Special Requests      BOTTLES DRAWN AEROBIC AND ANAEROBIC Blood Culture results may not be optimal due to an inadequate volume of blood received in culture bottles Performed at [facility], [place] [address] [place], [place] [address]    Culture PENDING    Report Status PENDING   Lactic acid, plasma     Status: None   Collection Time: 12/26/18  1:09 AM  Result Value Ref Range   Lactic Acid, Venous 1.3 0.5 - 1.9 mmol/L    Comment: Performed at [facility], [place] [address], [place], [place] [address]  Comprehensive metabolic panel     Status: Abnormal   Collection Time: 12/26/18  1:09 AM  Result Value Ref Range   Sodium 121 (L) 135 - 145 mmol/L   Potassium 4.6 3.5 - 5.1 mmol/L   Chloride 82 (L) 98 - 111 mmol/L   CO2 24 22 - 32 mmol/L   Glucose, Bld 880 (HH) 70 - 99 mg/dL    Comment: CRITICAL RESULT CALLED TO, READ BACK BY AND VERIFIED WITH: OXENDINE,J @ 0203 ON 101520 BY POTEAT,S    BUN 9 6 - 20 mg/dL   Creatinine, Ser 0.81 0.44 - 1.00 mg/dL   Calcium 8.6 (L) 8.9 - 10.3 mg/dL   Total Protein 6.7 6.5 - 8.1 g/dL   Albumin 2.4 (L) 3.5 - 5.0 g/dL   AST 14 (L) 15 - 41 U/L   ALT 18 0 - 44 U/L   Alkaline Phosphatase 121 38 - 126 U/L   Total Bilirubin 1.1 0.3 - 1.2 mg/dL   GFR calc non Af Amer >60 >60 mL/min   GFR calc Af Amer >60 >60 mL/min   Anion gap 15 5 - 15    Comment: Performed at [facility], [employer] [address] [place], [place] [address]  CBC with Differential     Status: Abnormal   Collection Time: 12/26/18  1:09 AM  Result Value Ref Range   WBC 17.0 (H) 4.0 - 10.5 K/uL    RBC 3.30 (L) 3.87 - 5.11 MIL/uL   Hemoglobin 9.9 (L) 12.0 - 15.0 g/dL   HCT 30.7 (L) 36.0 - 46.0 %   MCV 93.0 80.0 - 100.0 fL   MCH 30.0 26.0 - 34.0 pg   MCHC 32.2 30.0 - 36.0 g/dL   RDW 12.0 11.5 - 15.5 %  Platelets 375 150 - 400 K/uL   nRBC 0.0 0.0 - 0.2 %   Neutrophils Relative % 83 %   Neutro Abs 14.2 (H) 1.7 - 7.7 K/uL   Lymphocytes Relative 8 %   Lymphs Abs 1.3 0.7 - 4.0 K/uL   Monocytes Relative 8 %   Monocytes Absolute 1.4 (H) 0.1 - 1.0 K/uL   Eosinophils Relative 0 %   Eosinophils Absolute 0.0 0.0 - 0.5 K/uL   Basophils Relative 0 %   Basophils Absolute 0.0 0.0 - 0.1 K/uL   Immature Granulocytes 1 %   Abs Immature Granulocytes 0.12 (H) 0.00 - 0.07 K/uL    Comment: Performed at [facility], [place] [address], [place], [place] [address]  CBG monitoring, ED     Status: Abnormal   Collection Time: 12/26/18  1:13 AM  Result Value Ref Range   Glucose-Capillary >600 (HH) 70 - 99 mg/dL  SARS Coronavirus 2 by RT PCR (hospital order, performed in [facility] hospital lab) Nasopharyngeal Nasopharyngeal Swab     Status: None   Collection Time: 12/26/18  2:05 AM   Specimen: Nasopharyngeal Swab  Result Value Ref Range   SARS Coronavirus 2 NEGATIVE NEGATIVE    Comment: (NOTE) If result is NEGATIVE SARS-CoV-2 target nucleic acids are NOT DETECTED. The SARS-CoV-2 RNA is generally detectable in upper and lower  respiratory specimens during the acute phase of infection. The lowest  concentration of SARS-CoV-2 viral copies this assay can detect is 250  copies / mL. A negative result does not preclude SARS-CoV-2 infection  and should not be used as the sole basis for treatment or other  patient management decisions.  A negative result may occur with  improper specimen collection / handling, submission of specimen other  than nasopharyngeal swab, presence of viral mutation(s) within the  areas targeted by this assay, and inadequate number of viral copies  (<250 copies  / mL). A negative result must be combined with clinical  observations, patient history, and epidemiological information. If result is POSITIVE SARS-CoV-2 target nucleic acids are DETECTED. The SARS-CoV-2 RNA is generally detectable in upper and lower  respiratory specimens dur ing the acute phase of infection.  Positive  results are indicative of active infection with SARS-CoV-2.  Clinical  correlation with patient history and other diagnostic information is  necessary to determine patient infection status.  Positive results do  not rule out bacterial infection or co-infection with other viruses. If result is PRESUMPTIVE POSTIVE SARS-CoV-2 nucleic acids MAY BE PRESENT.   A presumptive positive result was obtained on the submitted specimen  and confirmed on repeat testing.  While 2019 novel coronavirus  (SARS-CoV-2) nucleic acids may be present in the submitted sample  additional confirmatory testing may be necessary for epidemiological  and / or clinical management purposes  to differentiate between  SARS-CoV-2 and other Sarbecovirus currently known to infect humans.  If clinically indicated additional testing with an alternate test  methodology [phone]) is advised. The SARS-CoV-2 RNA is generally  detectable in upper and lower respiratory sp ecimens during the acute  phase of infection. The expected result is Negative. Fact Sheet for Patients:  StrictlyIdeas.no Fact Sheet for Healthcare Providers: BankingDealers.co.za This test is not yet approved or cleared by the [place] FDA and has been authorized for detection and/or diagnosis of SARS-CoV-2 by FDA under an Emergency Use Authorization (EUA).  This EUA will remain in effect (meaning this test can be used) for the duration of the COVID-19 declaration  under Section 564(b)(1) of the Act, 21 U.S.C. section 360bbb-3(b)(1), unless the authorization is terminated or revoked  sooner. Performed at [facility], [place] [address], [place], [place] [address]   POC CBG, ED     Status: Abnormal   Collection Time: 12/26/18  2:49 AM  Result Value Ref Range   Glucose-Capillary 580 (HH) 70 - 99 mg/dL   Comment 1 Document in Chart   CBG monitoring, ED     Status: Abnormal   Collection Time: 12/26/18  5:14 AM  Result Value Ref Range   Glucose-Capillary 506 (HH) 70 - 99 mg/dL   Dg Hand Complete Left  Result Date: 12/26/2018 CLINICAL DATA:  Left hand infection EXAM: LEFT HAND - COMPLETE 3+ VIEW COMPARISON:  Radiograph 11/27/2011 FINDINGS: There is diffuse soft tissue swelling of the left hand most notably about the third digit. Foci of soft tissue gas are noted along the volar aspect of the third middle phalanx. No radiopaque foreign body is seen. No abnormal destructive change, cortical lucency, erosion or periostitis to suggest early radiographic features of osteomyelitis. Mild arthrosis of the interphalangeal joints, first carpometacarpal joint and triscaphe joint. IMPRESSION: 1. Diffuse soft tissue swelling of the left hand most notably about the third digit. 2. Foci of soft tissue gas along the volar aspect of the third middle phalanx. No radiographic evidence of osteomyelitis. 3. Mild arthrosis changes, as detailed above. Electronically Signed   By: Lovena Le M.D.   On: 12/26/2018 01:21    Pending Labs Unresulted Labs (From admission, onward)    Start     Ordered   12/26/18 0503  Blood gas, arterial  ONCE - STAT,   R     12/26/18 0502   12/26/18 0018  Blood culture (routine x 2)  BLOOD CULTURE X 2,   STAT     12/26/18 0018          Vitals/Pain Today's Vitals   12/26/18 0200 12/26/18 0230 12/26/18 0300 12/26/18 0330  BP: 134/89 130/68 107/61 (!) 109/56  Pulse: 95 95 93 91  Resp:  14    Temp:      TempSrc:      SpO2: 98% 99% 100% 94%  PainSc:        Isolation Precautions No active isolations  Medications Medications  liver  oil-zinc oxide (DESITIN) 40 % ointment 1 application (has no administration in time range)  dextrose 5 %-0.45 % sodium chloride infusion (has no administration in time range)  insulin regular bolus via infusion 0-10 Units (has no administration in time range)  insulin regular, human (MYXREDLIN) 100 units/ 100 mL infusion (4.5 Units/hr Intravenous New Bag/Given 12/26/18 0523)  dextrose 50 % solution 25 mL (has no administration in time range)  0.9 %  sodium chloride infusion ( Intravenous New Bag/Given 12/26/18 0524)  sodium chloride flush (NS) 0.9 % injection 3 mL (3 mLs Intravenous Given 12/26/18 0236)  sodium chloride 0.9 % bolus 1,000 mL (0 mLs Intravenous Stopped 12/26/18 0310)  nystatin (MYCOSTATIN/NYSTOP) topical powder ( Topical Given 12/26/18 0133)  vancomycin (VANCOCIN) 1,250 mg in sodium chloride 0.9 % 250 mL IVPB (0 mg Intravenous Stopped 12/26/18 0252)  clindamycin (CLEOCIN) IVPB 600 mg (0 mg Intravenous Stopped 12/26/18 0253)  piperacillin-tazobactam (ZOSYN) IVPB 3.375 g (0 g Intravenous Stopped 12/26/18 0310)  sodium chloride 0.9 % bolus 1,000 mL (0 mLs Intravenous Stopped 12/26/18 0358)  insulin aspart (novoLOG) injection 10 Units (10 Units Intravenous Given 12/26/18 0230)  acetaminophen (TYLENOL) tablet 650 mg (  650 mg Oral Given 12/26/18 0229)    Mobility non-ambulatory     Focused Assessments evaluation of swollen hand   R Recommendations: See Admitting Provider Note  Report given to:   Additional Notes:  glucostabilizer started at 4.5

## 2018-12-26 NOTE — ED Notes (Signed)
Patient transported to X-ray 

## 2018-12-26 NOTE — Progress Notes (Addendum)
A consult was received from an ED physician for Vancomycin and zosyn per pharmacy dosing.  The patient's profile has been reviewed for ht/wt/allergies/indication/available labs.   A one time order has been placed for Vancomycin 1250 mg and zosyn 3.375 gm.  Further antibiotics/pharmacy consults should be ordered by admitting physician if indicated.                       Thank you, Dorrene German 12/26/2018  12:33 AM

## 2018-12-26 NOTE — Progress Notes (Signed)
Pt. Requested to call her daughter-Ansonjia . Pt wanted to talk with daughter before her surgery.  Called daughter and pt. Talked with her on phone. Daughter was not aware of surgery; informed of Incision and drain of left hand.  Daughter requested no anesthesia; only local.   I informed Dr. Ermalene Postin of same- RN and Dr. Ermalene Postin talked with pt. And she stated she was able to make her own decisions and wanted medication.  Pt. Is alert/ oriented; able to answer all questions.  It was decided to proceed with pt. Request / wishers with the surgery.

## 2018-12-26 NOTE — ED Notes (Signed)
ED TO INPATIENT HANDOFF REPORT  Name/Age/Gender Gina [name] 58 y.o. female  Code Status Code Status History    Date Active Date Inactive Code Status Order ID Comments User Context   11/25/2018 0304 11/28/2018 1950 Full Code [id number]  Gina Rhymes, MD ED   05/09/2018 0146 05/21/2018 1805 Full Code [id number]  Gina Leff, MD Inpatient   03/13/2018 2234 03/14/2018 1625 Full Code [id number]  Gina Heidelberg, PA-C ED   11/06/2017 1209 11/16/2017 1656 Full Code [id number]  Gina Odea, MD ED   02/22/2017 2342 02/26/2017 2155 Full Code [id number]  Gina Costa, MD ED   02/20/2017 2300 02/21/2017 2220 Full Code [id number]  [name], Gina Almond, PA-C ED   06/01/2016 1944 06/07/2016 2220 Full Code [id number]  Gina Griffins, MD Inpatient   06/18/2013 1157 06/21/2013 1240 Full Code [id number]  Gina Patience, MD ED   07/31/2012 0518 08/03/2012 1526 Full Code [id number]  Theressa Millard, MD ED   Advance Care Planning Activity      Home/SNF/Other Home  Chief Complaint Allergic Reaction on Hand  Level of Care/Admitting Diagnosis ED Disposition    ED Disposition Condition Gina [name]: [facility] [id number]  Level of Care: Stepdown [14]  Admit to SDU based on following criteria: Severe physiological/psychological symptoms:  Any diagnosis requiring assessment & intervention at least every 4 hours on an ongoing basis to obtain desired patient outcomes including stability and rehabilitation  Covid Evaluation: N/A  Diagnosis: Cellulitis of hand, left [id number]  Admitting Physician: Gina [name] [phone]  Attending Physician: Gina [name] [phone]  Estimated length of stay: past midnight tomorrow  Certification:: I certify this patient will need inpatient services for at least 2 midnights  PT Class (Do Not Modify): Inpatient [101]  PT Acc Code (Do Not Modify): Private [1]       Medical History Past Medical History:  Diagnosis Date  .  Diabetes mellitus   . Hypertension   . Osteomyelitis of right foot ([place]) 02/22/2017  . Schizophrenia ([place])   . Septic arthritis of interphalangeal joint of toe of left foot ([place]) 06/02/2016  . Stress incontinence 04/26/2017    Allergies Allergies  Allergen Reactions  . Metformin And Related Other (See Comments)    Upset stomach    IV Location/Drains/Wounds Patient Lines/Drains/Airways Status   Active Line/Drains/Airways    Name:   Placement date:   Placement time:   Site:   Days:   Peripheral IV 12/26/18 Right Antecubital   12/26/18    0044    Antecubital   less than 1   Peripheral IV 12/26/18 Left Forearm   12/26/18    0046    Forearm   less than 1   External Urinary Catheter   07/29/18    1441    -   150   Incision (Closed) 05/10/18 Leg Left   05/10/18    1255     230   Pressure Injury 11/25/18 Sacrum Stage II -  Partial thickness loss of dermis presenting as a shallow open ulcer with a red, pink wound bed without slough. Pressure injury sacrum   11/25/18    1600     31          Labs/Imaging Results for orders placed or performed during the hospital encounter of 12/25/18 (from the past 48 hour(s))  POC CBG, ED     Status: Abnormal   Collection Time: 12/25/18 11:17 PM  Result Value Ref Range  Glucose-Capillary >600 (HH) 70 - 99 mg/dL   Comment 1 Notify RN   Blood culture (routine x 2)     Status: None (Preliminary result)   Collection Time: 12/26/18 12:18 AM   Specimen: BLOOD LEFT FOREARM  Result Value Ref Range   Specimen Description      BLOOD LEFT FOREARM Performed at [facility], [place] [address], [place], [place] [address]    Special Requests      BOTTLES DRAWN AEROBIC AND ANAEROBIC Blood Culture results may not be optimal due to an inadequate volume of blood received in culture bottles Performed at [facility], [place] [address], [place], [place] [address]    Culture PENDING    Report Status PENDING   Lactic acid, plasma     Status: None    Collection Time: 12/26/18  1:09 AM  Result Value Ref Range   Lactic Acid, Venous 1.3 0.5 - 1.9 mmol/L    Comment: Performed at [facility], [place] [address] [place], [place] [address]  Comprehensive metabolic panel     Status: Abnormal   Collection Time: 12/26/18  1:09 AM  Result Value Ref Range   Sodium 121 (L) 135 - 145 mmol/L   Potassium 4.6 3.5 - 5.1 mmol/L   Chloride 82 (L) 98 - 111 mmol/L   CO2 24 22 - 32 mmol/L   Glucose, Bld 880 (HH) 70 - 99 mg/dL    Comment: CRITICAL RESULT CALLED TO, READ BACK BY AND VERIFIED WITH: OXENDINE,J @ 0203 ON 101520 BY POTEAT,S    BUN 9 6 - 20 mg/dL   Creatinine, Ser 0.81 0.44 - 1.00 mg/dL   Calcium 8.6 (L) 8.9 - 10.3 mg/dL   Total Protein 6.7 6.5 - 8.1 g/dL   Albumin 2.4 (L) 3.5 - 5.0 g/dL   AST 14 (L) 15 - 41 U/L   ALT 18 0 - 44 U/L   Alkaline Phosphatase 121 38 - 126 U/L   Total Bilirubin 1.1 0.3 - 1.2 mg/dL   GFR calc non Af Amer >60 >60 mL/min   GFR calc Af Amer >60 >60 mL/min   Anion gap 15 5 - 15    Comment: Performed at [facility], [place] [address] [place], [place] [address]  CBC with Differential     Status: Abnormal   Collection Time: 12/26/18  1:09 AM  Result Value Ref Range   WBC 17.0 (H) 4.0 - 10.5 K/uL   RBC 3.30 (L) 3.87 - 5.11 MIL/uL   Hemoglobin 9.9 (L) 12.0 - 15.0 g/dL   HCT 30.7 (L) 36.0 - 46.0 %   MCV 93.0 80.0 - 100.0 fL   MCH 30.0 26.0 - 34.0 pg   MCHC 32.2 30.0 - 36.0 g/dL   RDW 12.0 11.5 - 15.5 %   Platelets 375 150 - 400 K/uL   nRBC 0.0 0.0 - 0.2 %   Neutrophils Relative % 83 %   Neutro Abs 14.2 (H) 1.7 - 7.7 K/uL   Lymphocytes Relative 8 %   Lymphs Abs 1.3 0.7 - 4.0 K/uL   Monocytes Relative 8 %   Monocytes Absolute 1.4 (H) 0.1 - 1.0 K/uL   Eosinophils Relative 0 %   Eosinophils Absolute 0.0 0.0 - 0.5 K/uL   Basophils Relative 0 %   Basophils Absolute 0.0 0.0 - 0.1 K/uL   Immature Granulocytes 1 %   Abs Immature Granulocytes 0.12 (H) 0.00 - 0.07 K/uL     Comment: Performed  at [facility], [place] [address], [place], [place] [address]  CBG monitoring, ED     Status: Abnormal   Collection Time: 12/26/18  1:13 AM  Result Value Ref Range   Glucose-Capillary >600 (HH) 70 - 99 mg/dL  SARS Coronavirus 2 by RT PCR (hospital order, performed in [facility] hospital lab) Nasopharyngeal Nasopharyngeal Swab     Status: None   Collection Time: 12/26/18  2:05 AM   Specimen: Nasopharyngeal Swab  Result Value Ref Range   SARS Coronavirus 2 NEGATIVE NEGATIVE    Comment: (NOTE) If result is NEGATIVE SARS-CoV-2 target nucleic acids are NOT DETECTED. The SARS-CoV-2 RNA is generally detectable in upper and lower  respiratory specimens during the acute phase of infection. The lowest  concentration of SARS-CoV-2 viral copies this assay can detect is 250  copies / mL. A negative result does not preclude SARS-CoV-2 infection  and should not be used as the sole basis for treatment or other  patient management decisions.  A negative result may occur with  improper specimen collection / handling, submission of specimen other  than nasopharyngeal swab, presence of viral mutation(s) within the  areas targeted by this assay, and inadequate number of viral copies  (<250 copies / mL). A negative result must be combined with clinical  observations, patient history, and epidemiological information. If result is POSITIVE SARS-CoV-2 target nucleic acids are DETECTED. The SARS-CoV-2 RNA is generally detectable in upper and lower  respiratory specimens dur ing the acute phase of infection.  Positive  results are indicative of active infection with SARS-CoV-2.  Clinical  correlation with patient history and other diagnostic information is  necessary to determine patient infection status.  Positive results do  not rule out bacterial infection or co-infection with other viruses. If result is PRESUMPTIVE POSTIVE SARS-CoV-2 nucleic acids MAY BE PRESENT.   A  presumptive positive result was obtained on the submitted specimen  and confirmed on repeat testing.  While 2019 novel coronavirus  (SARS-CoV-2) nucleic acids may be present in the submitted sample  additional confirmatory testing may be necessary for epidemiological  and / or clinical management purposes  to differentiate between  SARS-CoV-2 and other Sarbecovirus currently known to infect humans.  If clinically indicated additional testing with an alternate test  methodology [id number]) is advised. The SARS-CoV-2 RNA is generally  detectable in upper and lower respiratory sp ecimens during the acute  phase of infection. The expected result is Negative. Fact Sheet for Patients:  StrictlyIdeas.no Fact Sheet for Healthcare Providers: BankingDealers.co.za This test is not yet approved or cleared by the [place] FDA and has been authorized for detection and/or diagnosis of SARS-CoV-2 by FDA under an Emergency Use Authorization (EUA).  This EUA will remain in effect (meaning this test can be used) for the duration of the COVID-19 declaration under Section 564(b)(1) of the Act, 21 U.S.C. section 360bbb-3(b)(1), unless the authorization is terminated or revoked sooner. Performed at [facility], [place] [address], [place], [place] [address]   POC CBG, ED     Status: Abnormal   Collection Time: 12/26/18  2:49 AM  Result Value Ref Range   Glucose-Capillary 580 (HH) 70 - 99 mg/dL   Comment 1 Document in Chart   CBG monitoring, ED     Status: Abnormal   Collection Time: 12/26/18  5:14 AM  Result Value Ref Range   Glucose-Capillary 506 (HH) 70 - 99 mg/dL   Dg Hand Complete Left  Result Date: 12/26/2018  CLINICAL DATA:  Left hand infection EXAM: LEFT HAND - COMPLETE 3+ VIEW COMPARISON:  Radiograph 11/27/2011 FINDINGS: There is diffuse soft tissue swelling of the left hand most notably about the third digit. Foci of soft tissue  gas are noted along the volar aspect of the third middle phalanx. No radiopaque foreign body is seen. No abnormal destructive change, cortical lucency, erosion or periostitis to suggest early radiographic features of osteomyelitis. Mild arthrosis of the interphalangeal joints, first carpometacarpal joint and triscaphe joint. IMPRESSION: 1. Diffuse soft tissue swelling of the left hand most notably about the third digit. 2. Foci of soft tissue gas along the volar aspect of the third middle phalanx. No radiographic evidence of osteomyelitis. 3. Mild arthrosis changes, as detailed above. Electronically Signed   By: Lovena Le M.D.   On: 12/26/2018 01:21    Pending Labs Unresulted Labs (From admission, onward)    Start     Ordered   12/26/18 0503  Blood gas, arterial  ONCE - STAT,   R     12/26/18 0502   12/26/18 0018  Blood culture (routine x 2)  BLOOD CULTURE X 2,   STAT     12/26/18 0018          Vitals/Pain Today's Vitals   12/26/18 0200 12/26/18 0230 12/26/18 0300 12/26/18 0330  BP: 134/89 130/68 107/61 (!) 109/56  Pulse: 95 95 93 91  Resp:  14    Temp:      TempSrc:      SpO2: 98% 99% 100% 94%  PainSc:        Isolation Precautions No active isolations  Medications Medications  liver oil-zinc oxide (DESITIN) 40 % ointment 1 application (has no administration in time range)  dextrose 5 %-0.45 % sodium chloride infusion (has no administration in time range)  insulin regular bolus via infusion 0-10 Units (has no administration in time range)  insulin regular, human (MYXREDLIN) 100 units/ 100 mL infusion (4.5 Units/hr Intravenous New Bag/Given 12/26/18 0523)  dextrose 50 % solution 25 mL (has no administration in time range)  0.9 %  sodium chloride infusion ( Intravenous New Bag/Given 12/26/18 0524)  sodium chloride flush (NS) 0.9 % injection 3 mL (3 mLs Intravenous Given 12/26/18 0236)  sodium chloride 0.9 % bolus 1,000 mL (0 mLs Intravenous Stopped 12/26/18 0310)  nystatin  (MYCOSTATIN/NYSTOP) topical powder ( Topical Given 12/26/18 0133)  vancomycin (VANCOCIN) 1,250 mg in sodium chloride 0.9 % 250 mL IVPB (0 mg Intravenous Stopped 12/26/18 0252)  clindamycin (CLEOCIN) IVPB 600 mg (0 mg Intravenous Stopped 12/26/18 0253)  piperacillin-tazobactam (ZOSYN) IVPB 3.375 g (0 g Intravenous Stopped 12/26/18 0310)  sodium chloride 0.9 % bolus 1,000 mL (0 mLs Intravenous Stopped 12/26/18 0358)  insulin aspart (novoLOG) injection 10 Units (10 Units Intravenous Given 12/26/18 0230)  acetaminophen (TYLENOL) tablet 650 mg (650 mg Oral Given 12/26/18 0229)    Mobility non-ambulatory
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
## 2018-12-26 NOTE — Anesthesia Preprocedure Evaluation (Addendum)
Anesthesia Evaluation  Patient identified by MRN, date of birth, ID band Patient awake    Reviewed: Allergy & Precautions, NPO status , Patient's Chart, lab work & pertinent test results  History of Anesthesia Complications Negative for: history of anesthetic complications  Airway Mallampati: II  TM Distance: >3 FB Neck ROM: Full    Dental  (+) Dental Advisory Given   Pulmonary neg pulmonary ROS, neg recent URI,    breath sounds clear to auscultation       Cardiovascular hypertension, Pt. on medications (-) angina+ Peripheral Vascular Disease  (-) Past MI  Rhythm:Regular     Neuro/Psych PSYCHIATRIC DISORDERS Depression Schizophrenia negative neurological ROS     GI/Hepatic negative GI ROS, Neg liver ROS,   Endo/Other  diabetes, Type 2, Insulin Dependent  Renal/GU Renal disease     Musculoskeletal  (+) Arthritis , B/l LE amputee   Abdominal   Peds  Hematology  (+) anemia ,   Anesthesia Other Findings   Reproductive/Obstetrics                            Anesthesia Physical Anesthesia Plan  ASA: II  Anesthesia Plan: General   Post-op Pain Management:    Induction: Intravenous  PONV Risk Score and Plan: 3 and Ondansetron and Dexamethasone  Airway Management Planned: LMA  Additional Equipment: None  Intra-op Plan:   Post-operative Plan: Extubation in OR  Informed Consent: I have reviewed the patients History and Physical, chart, labs and discussed the procedure including the risks, benefits and alternatives for the proposed anesthesia with the patient or authorized representative who has indicated his/her understanding and acceptance.     Dental advisory given  Plan Discussed with: CRNA and Surgeon  Anesthesia Plan Comments: (Patient requested I not call her daughter)      Anesthesia Quick Evaluation

## 2018-12-26 NOTE — ED Notes (Addendum)
Unable to obtain secondary set of cultures. Started antibiotic therapy in efforts to not delay care.

## 2018-12-26 NOTE — Anesthesia Procedure Notes (Addendum)
Procedure Name: LMA Insertion Date/Time: 12/26/2018 2:17 PM Performed by: Eben Burow, CRNA Pre-anesthesia Checklist: Patient identified, Emergency Drugs available, Suction available, Patient being monitored and Timeout performed Patient Re-evaluated:Patient Re-evaluated prior to induction Oxygen Delivery Method: Simple face mask Preoxygenation: Pre-oxygenation with 100% oxygen Induction Type: IV induction LMA: LMA inserted LMA Size: 4.0 Number of attempts: 1 Placement Confirmation: positive ETCO2 and breath sounds checked- equal and bilateral Tube secured with: Tape Dental Injury: Teeth and Oropharynx as per pre-operative assessment

## 2018-12-27 ENCOUNTER — Encounter (HOSPITAL_COMMUNITY): Payer: Self-pay | Admitting: Orthopedic Surgery

## 2018-12-27 DIAGNOSIS — L03119 Cellulitis of unspecified part of limb: Secondary | ICD-10-CM

## 2018-12-27 DIAGNOSIS — L89210 Pressure ulcer of right hip, unstageable: Secondary | ICD-10-CM | POA: Diagnosis present

## 2018-12-27 DIAGNOSIS — E871 Hypo-osmolality and hyponatremia: Secondary | ICD-10-CM

## 2018-12-27 DIAGNOSIS — E0865 Diabetes mellitus due to underlying condition with hyperglycemia: Secondary | ICD-10-CM

## 2018-12-27 DIAGNOSIS — L02519 Cutaneous abscess of unspecified hand: Secondary | ICD-10-CM

## 2018-12-27 DIAGNOSIS — Z794 Long term (current) use of insulin: Secondary | ICD-10-CM

## 2018-12-27 LAB — CBC
HCT: 28.6 % — ABNORMAL LOW (ref 36.0–46.0)
Hemoglobin: 9.4 g/dL — ABNORMAL LOW (ref 12.0–15.0)
MCH: 30.4 pg (ref 26.0–34.0)
MCHC: 32.9 g/dL (ref 30.0–36.0)
MCV: 92.6 fL (ref 80.0–100.0)
Platelets: 322 10*3/uL (ref 150–400)
RBC: 3.09 MIL/uL — ABNORMAL LOW (ref 3.87–5.11)
RDW: 12.3 % (ref 11.5–15.5)
WBC: 16.1 10*3/uL — ABNORMAL HIGH (ref 4.0–10.5)
nRBC: 0 % (ref 0.0–0.2)

## 2018-12-27 LAB — GLUCOSE, CAPILLARY
Glucose-Capillary: 185 mg/dL — ABNORMAL HIGH (ref 70–99)
Glucose-Capillary: 265 mg/dL — ABNORMAL HIGH (ref 70–99)
Glucose-Capillary: 270 mg/dL — ABNORMAL HIGH (ref 70–99)
Glucose-Capillary: 283 mg/dL — ABNORMAL HIGH (ref 70–99)
Glucose-Capillary: 349 mg/dL — ABNORMAL HIGH (ref 70–99)
Glucose-Capillary: 426 mg/dL — ABNORMAL HIGH (ref 70–99)

## 2018-12-27 LAB — BASIC METABOLIC PANEL
Anion gap: 9 (ref 5–15)
BUN: 14 mg/dL (ref 6–20)
CO2: 26 mmol/L (ref 22–32)
Calcium: 8.1 mg/dL — ABNORMAL LOW (ref 8.9–10.3)
Chloride: 95 mmol/L — ABNORMAL LOW (ref 98–111)
Creatinine, Ser: 0.87 mg/dL (ref 0.44–1.00)
GFR calc Af Amer: >60 mL/min (ref >60)
GFR calc non Af Amer: >60 mL/min (ref >60)
Glucose, Bld: 377 mg/dL — ABNORMAL HIGH (ref 70–99)
Potassium: 4.5 mmol/L (ref 3.5–5.1)
Sodium: 130 mmol/L — ABNORMAL LOW (ref 135–145)

## 2018-12-27 MED ORDER — INSULIN GLARGINE 100 UNIT/ML ~~LOC~~ SOLN
25.0000 [IU] | Freq: Every day | SUBCUTANEOUS | Status: DC
  Administered 2018-12-27: 25 [IU] via SUBCUTANEOUS
  Filled 2018-12-27: qty 0.25

## 2018-12-27 MED ORDER — INSULIN ASPART 100 UNIT/ML ~~LOC~~ SOLN
10.0000 [IU] | Freq: Once | SUBCUTANEOUS | Status: AC
  Administered 2018-12-27: 10 [IU] via SUBCUTANEOUS

## 2018-12-27 MED ORDER — CHLORHEXIDINE GLUCONATE 4 % EX LIQD
60.0000 mL | CUTANEOUS | Status: DC
  Filled 2018-12-27: qty 60

## 2018-12-27 MED ORDER — INSULIN ASPART 100 UNIT/ML ~~LOC~~ SOLN
3.0000 [IU] | Freq: Three times a day (TID) | SUBCUTANEOUS | Status: DC
  Administered 2018-12-27: 3 [IU] via SUBCUTANEOUS

## 2018-12-27 MED ORDER — SODIUM CHLORIDE 0.9 % IV SOLN
INTRAVENOUS | Status: AC
  Administered 2018-12-27 (×2): via INTRAVENOUS

## 2018-12-27 MED ORDER — MUPIROCIN CALCIUM 2 % EX CREA
TOPICAL_CREAM | Freq: Every day | CUTANEOUS | Status: DC
  Administered 2018-12-27 – 2018-12-30 (×4): via TOPICAL
  Filled 2018-12-27 (×2): qty 15

## 2018-12-27 NOTE — Anesthesia Preprocedure Evaluation (Addendum)
Anesthesia Evaluation  Patient identified by MRN, date of birth, ID band Patient awake    Reviewed: Allergy & Precautions, NPO status , Patient's Chart, lab work & pertinent test results  History of Anesthesia Complications Negative for: history of anesthetic complications  Airway Mallampati: II  TM Distance: >3 FB Neck ROM: Full    Dental  (+) Dental Advisory Given, Partial Lower, Poor Dentition, Missing, Partial Upper   Pulmonary neg pulmonary ROS, neg recent URI,    breath sounds clear to auscultation       Cardiovascular hypertension, Pt. on medications (-) angina+ Peripheral Vascular Disease  (-) Past MI  Rhythm:Regular Rate:Normal     Neuro/Psych PSYCHIATRIC DISORDERS Depression Schizophrenia negative neurological ROS     GI/Hepatic negative GI ROS, Neg liver ROS,   Endo/Other  diabetes, Poorly Controlled, Type 2, Insulin Dependent  Renal/GU Renal InsufficiencyRenal disease  negative genitourinary   Musculoskeletal  (+) Arthritis , B/l LE amputee Cellulitis/infection Left hand   Abdominal   Peds  Hematology  (+) anemia ,   Anesthesia Other Findings   Reproductive/Obstetrics                           Anesthesia Physical  Anesthesia Plan  ASA: III and emergent  Anesthesia Plan: General   Post-op Pain Management:    Induction: Intravenous  PONV Risk Score and Plan: 3 and Ondansetron and Treatment may vary due to age or medical condition  Airway Management Planned: LMA  Additional Equipment: None  Intra-op Plan:   Post-operative Plan: Extubation in OR  Informed Consent: I have reviewed the patients History and Physical, chart, labs and discussed the procedure including the risks, benefits and alternatives for the proposed anesthesia with the patient or authorized representative who has indicated his/her understanding and acceptance.     Dental advisory given  Plan  Discussed with: CRNA and Surgeon  Anesthesia Plan Comments:        Anesthesia Quick Evaluation

## 2018-12-27 NOTE — Progress Notes (Signed)
Inpatient Diabetes Program Recommendations  AACE/ADA: New Consensus Statement on Inpatient Glycemic Control (2015)  Target Ranges:  Prepandial:   less than 140 mg/dL      Peak postprandial:   less than 180 mg/dL (1-2 hours)      Critically ill patients:  140 - 180 mg/dL   Lab Results  Component Value Date   GLUCAP 185 (H) 12/27/2018   HGBA1C >15.5 (H) 11/25/2018    Review of Glycemic Control  Diabetes history: DM 2 Also bilateral BKA Outpatient Diabetes medications: Lantus 25 units, Novolog 0-15 units tid Current orders for Inpatient glycemic control:  Lantus 20 units qhs Novolog 0-15 units tid + hs  A1c >15.5% on 9/14, Glucose 880 on presentation to ED  Glucose trends increased into 400's last night, however pt received Decadron during surgery.  Fasting glucose 180's this am.   Consider increasing Lantus to home dose 25 units.   Thanks,  Tama Headings RN, MSN, BC-ADM Inpatient Diabetes Coordinator Team Pager [phone] (8a-5p)

## 2018-12-27 NOTE — Care Management Important Message (Signed)
Important Message  Patient Details IM Letter given to Rowe Pavy SW to present to the Patient Name: Gina [name] MRN: [id number] Date of Birth: 1960/12/01   Medicare Important Message Given:  Yes     Kerin Salen 12/27/2018, 10:16 AM
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
## 2018-12-27 NOTE — Progress Notes (Signed)
This RN went into patient's room to have patient sign the informed consent for the procedure scheduled for tomorrow morning. The patient stated that she was unaware of Dr. Caralyn Guile wanting to take patient back into the O.R. to perform a second debridement and possible amputation. Consent was not signed and MD will be notified.
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
## 2018-12-27 NOTE — Progress Notes (Signed)
The patient's chart was reviewed The plan will be for tomorrow to take her back to the operating theater For repeat irrigation and debridement and possible amputation of the finger. The patient's wound cultures were reviewed Patient has a significant infection Preoperative orders place.

## 2018-12-28 ENCOUNTER — Inpatient Hospital Stay (HOSPITAL_COMMUNITY): Payer: Medicare Other | Admitting: Anesthesiology

## 2018-12-28 ENCOUNTER — Encounter (HOSPITAL_COMMUNITY)
Admission: EM | Disposition: A | Discharge status: Home Health | Payer: Self-pay | Source: Home / Self Care | Attending: Internal Medicine

## 2018-12-28 ENCOUNTER — Encounter (HOSPITAL_COMMUNITY): Payer: Self-pay | Admitting: Anesthesiology

## 2018-12-28 DIAGNOSIS — D638 Anemia in other chronic diseases classified elsewhere: Secondary | ICD-10-CM

## 2018-12-28 HISTORY — PX: I & D EXTREMITY: SHX5045

## 2018-12-28 HISTORY — PX: AMPUTATION: SHX166

## 2018-12-28 LAB — CBC
HCT: 29 % — ABNORMAL LOW (ref 36.0–46.0)
Hemoglobin: 9.1 g/dL — ABNORMAL LOW (ref 12.0–15.0)
MCH: 30 pg (ref 26.0–34.0)
MCHC: 31.4 g/dL (ref 30.0–36.0)
MCV: 95.7 fL (ref 80.0–100.0)
Platelets: 347 10*3/uL (ref 150–400)
RBC: 3.03 MIL/uL — ABNORMAL LOW (ref 3.87–5.11)
RDW: 12.7 % (ref 11.5–15.5)
WBC: 11.5 10*3/uL — ABNORMAL HIGH (ref 4.0–10.5)
nRBC: 0 % (ref 0.0–0.2)

## 2018-12-28 LAB — BASIC METABOLIC PANEL
Anion gap: 10 (ref 5–15)
BUN: 20 mg/dL (ref 6–20)
CO2: 23 mmol/L (ref 22–32)
Calcium: 7.8 mg/dL — ABNORMAL LOW (ref 8.9–10.3)
Chloride: 100 mmol/L (ref 98–111)
Creatinine, Ser: 0.64 mg/dL (ref 0.44–1.00)
GFR calc Af Amer: >60 mL/min (ref >60)
GFR calc non Af Amer: >60 mL/min (ref >60)
Glucose, Bld: 358 mg/dL — ABNORMAL HIGH (ref 70–99)
Potassium: 4.1 mmol/L (ref 3.5–5.1)
Sodium: 133 mmol/L — ABNORMAL LOW (ref 135–145)

## 2018-12-28 LAB — GLUCOSE, CAPILLARY
Glucose-Capillary: 184 mg/dL — ABNORMAL HIGH (ref 70–99)
Glucose-Capillary: 211 mg/dL — ABNORMAL HIGH (ref 70–99)
Glucose-Capillary: 261 mg/dL — ABNORMAL HIGH (ref 70–99)
Glucose-Capillary: 344 mg/dL — ABNORMAL HIGH (ref 70–99)
Glucose-Capillary: 366 mg/dL — ABNORMAL HIGH (ref 70–99)
Glucose-Capillary: 72 mg/dL (ref 70–99)
Glucose-Capillary: 72 mg/dL (ref 70–99)
Glucose-Capillary: 74 mg/dL (ref 70–99)
Glucose-Capillary: 85 mg/dL (ref 70–99)

## 2018-12-28 SURGERY — IRRIGATION AND DEBRIDEMENT EXTREMITY
Anesthesia: General | Site: Hand | Laterality: Left

## 2018-12-28 MED ORDER — PROPOFOL 10 MG/ML IV BOLUS
INTRAVENOUS | Status: DC | PRN
  Administered 2018-12-28: 150 mg via INTRAVENOUS

## 2018-12-28 MED ORDER — GABAPENTIN 100 MG PO CAPS
200.0000 mg | ORAL_CAPSULE | Freq: Once | ORAL | Status: AC
  Administered 2018-12-28: 200 mg via ORAL
  Filled 2018-12-28: qty 2

## 2018-12-28 MED ORDER — INSULIN GLARGINE 100 UNIT/ML ~~LOC~~ SOLN
35.0000 [IU] | Freq: Every day | SUBCUTANEOUS | Status: DC
  Administered 2018-12-28: 35 [IU] via SUBCUTANEOUS
  Filled 2018-12-28: qty 0.35

## 2018-12-28 MED ORDER — MIDAZOLAM HCL 2 MG/2ML IJ SOLN
INTRAMUSCULAR | Status: AC
  Filled 2018-12-28: qty 2

## 2018-12-28 MED ORDER — PHENYLEPHRINE 40 MCG/ML (10ML) SYRINGE FOR IV PUSH (FOR BLOOD PRESSURE SUPPORT)
PREFILLED_SYRINGE | INTRAVENOUS | Status: DC | PRN
  Administered 2018-12-28 (×3): 80 ug via INTRAVENOUS

## 2018-12-28 MED ORDER — INSULIN ASPART 100 UNIT/ML ~~LOC~~ SOLN
6.0000 [IU] | Freq: Three times a day (TID) | SUBCUTANEOUS | Status: DC
  Administered 2018-12-28 (×2): 6 [IU] via SUBCUTANEOUS

## 2018-12-28 MED ORDER — 0.9 % SODIUM CHLORIDE (POUR BTL) OPTIME
TOPICAL | Status: DC | PRN
  Administered 2018-12-28: 1000 mL

## 2018-12-28 MED ORDER — FENTANYL CITRATE (PF) 100 MCG/2ML IJ SOLN
INTRAMUSCULAR | Status: AC
  Filled 2018-12-28: qty 2

## 2018-12-28 MED ORDER — MIDAZOLAM HCL 5 MG/5ML IJ SOLN
INTRAMUSCULAR | Status: DC | PRN
  Administered 2018-12-28: 2 mg via INTRAVENOUS

## 2018-12-28 MED ORDER — OXYCODONE-ACETAMINOPHEN 5-325 MG PO TABS
1.0000 | ORAL_TABLET | ORAL | Status: DC | PRN
  Administered 2018-12-28 (×2): 1 via ORAL
  Filled 2018-12-28 (×2): qty 1

## 2018-12-28 MED ORDER — FENTANYL CITRATE (PF) 100 MCG/2ML IJ SOLN: 25.0000 ug | INTRAMUSCULAR | Status: DC | PRN

## 2018-12-28 MED ORDER — PROPOFOL 10 MG/ML IV BOLUS
INTRAVENOUS | Status: AC
  Filled 2018-12-28: qty 20

## 2018-12-28 MED ORDER — DEXTROSE 50 % IV SOLN
INTRAVENOUS | Status: DC | PRN
  Administered 2018-12-28: 12.5 mL via INTRAVENOUS

## 2018-12-28 MED ORDER — ONDANSETRON HCL 4 MG/2ML IJ SOLN: 4.0000 mg | Freq: Once | INTRAMUSCULAR | Status: DC | PRN

## 2018-12-28 MED ORDER — LIDOCAINE 2% (20 MG/ML) 5 ML SYRINGE
INTRAMUSCULAR | Status: DC | PRN
  Administered 2018-12-28: 75 mg via INTRAVENOUS

## 2018-12-28 MED ORDER — ESCITALOPRAM OXALATE 20 MG PO TABS
20.0000 mg | ORAL_TABLET | Freq: Every day | ORAL | Status: DC
  Administered 2018-12-28 – 2018-12-30 (×3): 20 mg via ORAL
  Filled 2018-12-28 (×3): qty 1

## 2018-12-28 MED ORDER — FENTANYL CITRATE (PF) 100 MCG/2ML IJ SOLN
INTRAMUSCULAR | Status: DC | PRN
  Administered 2018-12-28: 50 ug via INTRAVENOUS

## 2018-12-28 MED ORDER — SODIUM CHLORIDE 0.9 % IR SOLN
Status: DC | PRN
  Administered 2018-12-28: 3000 mL

## 2018-12-28 MED ORDER — LACTATED RINGERS IV SOLN
INTRAVENOUS | Status: DC | PRN
  Administered 2018-12-28: 11:00:00 via INTRAVENOUS

## 2018-12-28 MED ORDER — DEXTROSE 50 % IV SOLN
INTRAVENOUS | Status: AC
  Filled 2018-12-28: qty 50

## 2018-12-28 MED ORDER — PHENYLEPHRINE 40 MCG/ML (10ML) SYRINGE FOR IV PUSH (FOR BLOOD PRESSURE SUPPORT)
PREFILLED_SYRINGE | INTRAVENOUS | Status: AC
  Filled 2018-12-28: qty 10

## 2018-12-28 MED ORDER — ONDANSETRON HCL 4 MG/2ML IJ SOLN
INTRAMUSCULAR | Status: DC | PRN
  Administered 2018-12-28: 4 mg via INTRAVENOUS

## 2018-12-28 MED ORDER — DEXTROSE 50 % IV SOLN: 12.5000 g | Freq: Once | INTRAVENOUS | Status: DC

## 2018-12-28 MED ORDER — ONDANSETRON HCL 4 MG/2ML IJ SOLN
INTRAMUSCULAR | Status: AC
  Filled 2018-12-28: qty 2

## 2018-12-28 SURGICAL SUPPLY — 35 items
BAG ZIPLOCK 12X15 (MISCELLANEOUS) ×3 IMPLANT
BLADE SURG SZ10 CARB STEEL (BLADE) ×3 IMPLANT
BNDG ELASTIC 4X5.8 VLCR STR LF (GAUZE/BANDAGES/DRESSINGS) ×3 IMPLANT
BNDG GAUZE ELAST 4 BULKY (GAUZE/BANDAGES/DRESSINGS) ×3 IMPLANT
COVER SURGICAL LIGHT HANDLE (MISCELLANEOUS) ×3 IMPLANT
COVER WAND RF STERILE (DRAPES) IMPLANT
CUFF TOURN SGL QUICK 18X4 (TOURNIQUET CUFF) ×3 IMPLANT
DRAIN PENROSE 18X1/2 LTX STRL (DRAIN) ×3 IMPLANT
DRAPE SURG 17X11 SM STRL (DRAPES) ×6 IMPLANT
DRSG EMULSION OIL 3X3 NADH (GAUZE/BANDAGES/DRESSINGS) ×3 IMPLANT
DRSG PAD ABDOMINAL 8X10 ST (GAUZE/BANDAGES/DRESSINGS) ×3 IMPLANT
ELECT REM PT RETURN 15FT ADLT (MISCELLANEOUS) ×3 IMPLANT
GAUZE SPONGE 4X4 12PLY STRL (GAUZE/BANDAGES/DRESSINGS) ×3 IMPLANT
GLOVE SURG ORTHO 8.0 STRL STRW (GLOVE) ×3 IMPLANT
GOWN STRL REUS W/TWL XL LVL3 (GOWN DISPOSABLE) ×3 IMPLANT
IV LACTATED RINGER IRRG 3000ML (IV SOLUTION) ×2
IV LR IRRIG 3000ML ARTHROMATIC (IV SOLUTION) ×1 IMPLANT
KIT BASIN OR (CUSTOM PROCEDURE TRAY) ×3 IMPLANT
KIT TURNOVER KIT A (KITS) IMPLANT
MANIFOLD NEPTUNE II (INSTRUMENTS) ×3 IMPLANT
PACK ORTHO EXTREMITY (CUSTOM PROCEDURE TRAY) ×3 IMPLANT
PAD CAST 4YDX4 CTTN HI CHSV (CAST SUPPLIES) ×1 IMPLANT
PADDING CAST COTTON 4X4 STRL (CAST SUPPLIES) ×2
PROTECTOR NERVE ULNAR (MISCELLANEOUS) ×3 IMPLANT
SOL PREP POV-IOD 4OZ 10% (MISCELLANEOUS) ×3 IMPLANT
SOL PREP PROV IODINE SCRUB 4OZ (MISCELLANEOUS) ×3 IMPLANT
SUT MNCRL AB 3-0 PS2 18 (SUTURE) ×3 IMPLANT
SUT PROLENE 3 0 PS 2 (SUTURE) ×3 IMPLANT
SUT VIC AB 1 CT1 27 (SUTURE) ×2
SUT VIC AB 1 CT1 27XBRD ANTBC (SUTURE) ×1 IMPLANT
SUT VIC AB 2-0 CT1 27 (SUTURE) ×2
SUT VIC AB 2-0 CT1 27XBRD (SUTURE) ×1 IMPLANT
SYR 20ML LL LF (SYRINGE) ×3 IMPLANT
SYR CONTROL 10ML LL (SYRINGE) ×3 IMPLANT
TOWEL OR 17X26 10 PK STRL BLUE (TOWEL DISPOSABLE) ×3 IMPLANT
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
## 2018-12-28 NOTE — Anesthesia Procedure Notes (Signed)
Procedure Name: LMA Insertion Date/Time: 12/28/2018 11:25 AM Performed by: Lollie Sails, CRNA Pre-anesthesia Checklist: Patient identified, Emergency Drugs available, Suction available, Patient being monitored and Timeout performed Patient Re-evaluated:Patient Re-evaluated prior to induction Oxygen Delivery Method: Circle system utilized Preoxygenation: Pre-oxygenation with 100% oxygen Induction Type: IV induction LMA: LMA inserted LMA Size: 4.0 Number of attempts: 1 Placement Confirmation: positive ETCO2 and breath sounds checked- equal and bilateral Tube secured with: Tape Dental Injury: Teeth and Oropharynx as per pre-operative assessment

## 2018-12-28 NOTE — Transfer of Care (Signed)
Immediate Anesthesia Transfer of Care Note  Patient: Gina [name]  Procedure(s) Performed: IRRIGATION AND DEBRIDEMENT EXTREMITY (Left Hand) AMPUTATION THIRD DIGIT (Left Hand)  Patient Location: PACU  Anesthesia Type:General  Level of Consciousness: drowsy and responds to stimulation  Airway & Oxygen Therapy: Patient Spontanous Breathing and Patient connected to face mask oxygen  Post-op Assessment: Report given to RN and Post -op Vital signs reviewed and stable  Post vital signs: Reviewed and stable  Last Vitals:  Vitals Value Taken Time  BP 93/67 12/28/18 1215  Temp    Pulse 77 12/28/18 1215  Resp 11 12/28/18 1215  SpO2 100 % 12/28/18 1215  Vitals shown include unvalidated device data.  Last Pain:  Vitals:   12/27/18 2037  TempSrc:   PainSc: 0-No pain      Patients Stated Pain Goal: 3 ([id number] [phone])  Complications: No apparent anesthesia complications

## 2018-12-28 NOTE — Anesthesia Postprocedure Evaluation (Signed)
Anesthesia Post Note  Patient: Madysn Farace  Procedure(s) Performed: IRRIGATION AND DEBRIDEMENT EXTREMITY (Left Hand) AMPUTATION THIRD DIGIT (Left Hand)     Patient location during evaluation: PACU Anesthesia Type: General Level of consciousness: awake and alert and oriented Pain management: pain level controlled Vital Signs Assessment: post-procedure vital signs reviewed and stable Respiratory status: spontaneous breathing, nonlabored ventilation and respiratory function stable Cardiovascular status: blood pressure returned to baseline and stable Postop Assessment: no apparent nausea or vomiting Anesthetic complications: no    Last Vitals:  Vitals:   12/28/18 1215 12/28/18 1230  BP: 93/67 115/71  Pulse:    Resp:    Temp: 36.6 C   SpO2:      Last Pain:  Vitals:   12/28/18 1230  TempSrc:   PainSc: 0-No pain                 Adriel Kessen A.

## 2018-12-28 NOTE — Progress Notes (Signed)
Pt returned from OR. Vital signs stable. Pt resting comfortably.  Will continue to monitor.

## 2018-12-28 NOTE — Progress Notes (Signed)
Transferred pt to OR for procedure at 1035.
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
## 2018-12-28 NOTE — Op Note (Signed)
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 Iran Planas who scrubbed and present for the entire procedure  ASSISTANT SURGEON: None  ANESTHESIA: General via laryngeal mask airway  OPERATIVE PROCEDURE: #1: Left long finger amputation to the level of the metacarpophalangeal joint with local neurectomies and primary closure #2: Left long finger flexor sheath drainage  IMPLANTS: None  RADIOGRAPHIC INTERPRETATION: None  SURGICAL INDICATIONS: Patient is a right-hand-dominant female with longstanding uncontrolled diabetes.  Patient had the infection within the left long finger.  Patient was initially taken the operating room 2 days ago for initial management.  Brought back the operating room today.  The patient was noted to have the gangrenous left long finger with still large amounts of purulent drainage still present in unsalvageable finger.  It was recommended that she undergo the above procedure.  Signed informed consent was obtained the day of surgery.  SURGICAL TECHNIQUE: Patient was palpated find the preoperative holding area marked the permanent marker made the left long finger and indicate correct operative site.  Patient brought back the operating placed supine on anesthesia table with a general anesthetic was administered.  Patient tolerated well.  A well-padded tourniquet placed on the left forearm and seal with appropriate drape.  Left upper extremities then prepped and draped in normal sterile fashion.  A timeout was called the correct site was identified the procedure then begun.  Attention then turned to the left long finger the patient did have the unsalvageable left long finger at the dead from the middle phalanx distally.  Purulent drainage both dorsally and volarly.  It was decided then to proceed with tissue excision and amputation at the level of the metacarpal phalangeal joint.  A racquet shaped  incision was then made dorsally curving volarly and the finger was then removed at the level of the metacarpal phalangeal joint.  Flexor tendons were identified and resected flexor sheath drainage was then carried out thorough irrigation and drainage of the volar and dorsal areas was then done.  Following this the volar capsule over the metacarpal head was then loosely reapproximated with 3-0 Monocryl suture 2 sutures there.  Following this local neurectomies were then carried out to the long finger preserving the digital nerves to the ring and index finger.  Following local neurectomies primary closure was then carried out with simple Prolene suture closing this within the webspace.  Adaptic dressing sterile compressive bandage then applied.  The patient tolerated the procedure well extubated taken recovery in good condition.  POSTOPERATIVE PLAN: Patient be admitted back to the internal medicine service.  We will need to look at the wound within the coming 4 to 5 days.  Keep the bandage on at all times.  Do not get it wet do not take it off.  If there is any questions about the wound please contact me [phone] continue with the IV antibiotics and disposition as per the primary team.

## 2018-12-29 ENCOUNTER — Encounter (HOSPITAL_COMMUNITY): Payer: Self-pay | Admitting: Orthopedic Surgery

## 2018-12-29 DIAGNOSIS — E10649 Type 1 diabetes mellitus with hypoglycemia without coma: Secondary | ICD-10-CM

## 2018-12-29 DIAGNOSIS — I96 Gangrene, not elsewhere classified: Secondary | ICD-10-CM

## 2018-12-29 LAB — GLUCOSE, CAPILLARY
Glucose-Capillary: 108 mg/dL — ABNORMAL HIGH (ref 70–99)
Glucose-Capillary: 110 mg/dL — ABNORMAL HIGH (ref 70–99)
Glucose-Capillary: 137 mg/dL — ABNORMAL HIGH (ref 70–99)
Glucose-Capillary: 160 mg/dL — ABNORMAL HIGH (ref 70–99)
Glucose-Capillary: 205 mg/dL — ABNORMAL HIGH (ref 70–99)
Glucose-Capillary: 67 mg/dL — ABNORMAL LOW (ref 70–99)
Glucose-Capillary: 72 mg/dL (ref 70–99)

## 2018-12-29 LAB — CBC
HCT: 29.1 % — ABNORMAL LOW (ref 36.0–46.0)
Hemoglobin: 9 g/dL — ABNORMAL LOW (ref 12.0–15.0)
MCH: 29.8 pg (ref 26.0–34.0)
MCHC: 30.9 g/dL (ref 30.0–36.0)
MCV: 96.4 fL (ref 80.0–100.0)
Platelets: 358 10*3/uL (ref 150–400)
RBC: 3.02 MIL/uL — ABNORMAL LOW (ref 3.87–5.11)
RDW: 13 % (ref 11.5–15.5)
WBC: 12.1 10*3/uL — ABNORMAL HIGH (ref 4.0–10.5)
nRBC: 0 % (ref 0.0–0.2)

## 2018-12-29 LAB — BASIC METABOLIC PANEL
Anion gap: 9 (ref 5–15)
BUN: 19 mg/dL (ref 6–20)
CO2: 23 mmol/L (ref 22–32)
Calcium: 8.4 mg/dL — ABNORMAL LOW (ref 8.9–10.3)
Chloride: 103 mmol/L (ref 98–111)
Creatinine, Ser: 0.55 mg/dL (ref 0.44–1.00)
GFR calc Af Amer: >60 mL/min (ref >60)
GFR calc non Af Amer: >60 mL/min (ref >60)
Glucose, Bld: 133 mg/dL — ABNORMAL HIGH (ref 70–99)
Potassium: 3.9 mmol/L (ref 3.5–5.1)
Sodium: 135 mmol/L (ref 135–145)

## 2018-12-29 MED ORDER — CEFAZOLIN SODIUM-DEXTROSE 2-4 GM/100ML-% IV SOLN
2.0000 g | Freq: Three times a day (TID) | INTRAVENOUS | Status: DC
  Administered 2018-12-29 – 2018-12-30 (×2): 2 g via INTRAVENOUS
  Filled 2018-12-29 (×4): qty 100

## 2018-12-29 MED ORDER — INSULIN ASPART 100 UNIT/ML ~~LOC~~ SOLN
4.0000 [IU] | Freq: Three times a day (TID) | SUBCUTANEOUS | Status: DC
  Administered 2018-12-29 – 2018-12-30 (×3): 4 [IU] via SUBCUTANEOUS

## 2018-12-29 MED ORDER — INSULIN GLARGINE 100 UNIT/ML ~~LOC~~ SOLN
30.0000 [IU] | Freq: Every day | SUBCUTANEOUS | Status: DC
  Administered 2018-12-29: 30 [IU] via SUBCUTANEOUS
  Filled 2018-12-29 (×2): qty 0.3
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
## 2018-12-30 DIAGNOSIS — G92 Toxic encephalopathy: Secondary | ICD-10-CM | POA: Diagnosis present

## 2018-12-30 DIAGNOSIS — G928 Other toxic encephalopathy: Secondary | ICD-10-CM | POA: Diagnosis present

## 2018-12-30 DIAGNOSIS — I96 Gangrene, not elsewhere classified: Secondary | ICD-10-CM | POA: Diagnosis present

## 2018-12-30 DIAGNOSIS — R6520 Severe sepsis without septic shock: Secondary | ICD-10-CM | POA: Diagnosis present

## 2018-12-30 DIAGNOSIS — L02512 Cutaneous abscess of left hand: Secondary | ICD-10-CM | POA: Diagnosis present

## 2018-12-30 DIAGNOSIS — A419 Sepsis, unspecified organism: Secondary | ICD-10-CM

## 2018-12-30 LAB — GLUCOSE, CAPILLARY
Glucose-Capillary: 154 mg/dL — ABNORMAL HIGH (ref 70–99)
Glucose-Capillary: 148 mg/dL — ABNORMAL HIGH (ref 70–99)
Glucose-Capillary: 158 mg/dL — ABNORMAL HIGH (ref 70–99)

## 2018-12-30 MED ORDER — TRAMADOL HCL 50 MG PO TABS: 50.0000 mg | ORAL_TABLET | Freq: Four times a day (QID) | ORAL | 0 refills | Status: AC | PRN

## 2018-12-30 MED ORDER — INSULIN GLARGINE 100 UNIT/ML SOLOSTAR PEN: 35.0000 [IU] | PEN_INJECTOR | Freq: Every day | SUBCUTANEOUS | 11 refills | Status: DC

## 2018-12-30 MED ORDER — DOXYCYCLINE HYCLATE 100 MG PO TABS: 100.0000 mg | ORAL_TABLET | Freq: Two times a day (BID) | ORAL | 0 refills | Status: DC

## 2018-12-30 NOTE — Discharge Summary (Signed)
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 observed. Performed At: [facility] [place], [place] [id number] Rush Farmer MD SJ:[id number]       Time coordinating discharge: 35 minutes  SIGNED:   Louellen Molder, MD  [facility] 12/30/2018, 12:52 PM Pager   If 7PM-7AM, please contact night-coverage [email or web address] Password [email or web address]

## 2018-12-30 NOTE — Discharge Instructions (Signed)
Blood Glucose Monitoring, Adult °Monitoring your blood sugar (glucose) is an important part of managing your diabetes (diabetes mellitus). Blood glucose monitoring involves checking your blood glucose as often as directed and keeping a record (log) of your results over time. °Checking your blood glucose regularly and keeping a blood glucose log can: °· Help you and your health care provider adjust your diabetes management plan as needed, including your medicines or insulin. °· Help you understand how food, exercise, illnesses, and medicines affect your blood glucose. °· Let you know what your blood glucose is at any time. You can quickly find out if you have low blood glucose (hypoglycemia) or high blood glucose (hyperglycemia). °Your health care provider will set individualized treatment goals for you. Your goals will be based on your age, other medical conditions you have, and how you respond to diabetes treatment. Generally, the goal of treatment is to maintain the following blood glucose levels: °· Before meals (preprandial): 80-130 mg/dL (4.4-7.2 mmol/L). °· After meals (postprandial): below 180 mg/dL (10 mmol/L). °· A1c level: less than 7%. °Supplies needed: °· Blood glucose meter. °· Test strips for your meter. Each meter has its own strips. You must use the strips that came with your meter. °· A needle to prick your finger (lancet). Do not use a lancet more than one time. °· A device that holds the lancet (lancing device). °· A journal or log book to write down your results. °How to check your blood glucose ° °1. Wash your hands with soap and water. °2. Prick the side of your finger (not the tip) with the lancet. Use a different finger each time. °3. Gently rub the finger until a small drop of blood appears. °4. Follow instructions that come with your meter for inserting the test strip, applying blood to the strip, and using your blood glucose meter. °5. Write down your result and any notes. °Some meters  allow you to use areas of your body other than your finger (alternative sites) to test your blood. The most common alternative sites are: °· Forearm. °· Thigh. °· Palm of the hand. °If you think you may have hypoglycemia, or if you have a history of not knowing when your blood glucose is getting low (hypoglycemia unawareness), do not use alternative sites. Use your finger instead. Alternative sites may not be as accurate as the fingers, because blood flow is slower in these areas. This means that the result you get may be delayed, and it may be different from the result that you would get from your finger. °Follow these instructions at home: °Blood glucose log ° °· Every time you check your blood glucose, write down your result. Also write down any notes about things that may be affecting your blood glucose, such as your diet and exercise for the day. This information can help you and your health care provider: °? Look for patterns in your blood glucose over time. °? Adjust your diabetes management plan as needed. °· Check if your meter allows you to download your records to a computer. Most glucose meters store a record of glucose readings in the meter. °If you have type 1 diabetes: °· Check your blood glucose 2 or more times a day. °· Also check your blood glucose: °? Before every insulin injection. °? Before and after exercise. °? Before meals. °? 2 hours after a meal. °? Occasionally between 2:00 a.m. and 3:00 a.m., as directed. °? Before potentially dangerous tasks, like driving or using heavy machinery. °?   At bedtime. °· You may need to check your blood glucose more often, up to 6-10 times a day, if you: °? Use an insulin pump. °? Need multiple daily injections (MDI). °? Have diabetes that is not well-controlled. °? Are ill. °? Have a history of severe hypoglycemia. °? Have hypoglycemia unawareness. °If you have type 2 diabetes: °· If you take insulin or other diabetes medicines, check your blood glucose 2 or  more times a day. °· If you are on intensive insulin therapy, check your blood glucose 4 or more times a day. Occasionally, you may also need to check between 2:00 a.m. and 3:00 a.m., as directed. °· Also check your blood glucose: °? Before and after exercise. °? Before potentially dangerous tasks, like driving or using heavy machinery. °· You may need to check your blood glucose more often if: °? Your medicine is being adjusted. °? Your diabetes is not well-controlled. °? You are ill. °General tips °· Always keep your supplies with you. °· If you have questions or need help, all blood glucose meters have a 24-hour "hotline" phone number that you can call. You may also contact your health care provider. °· After you use a few boxes of test strips, adjust (calibrate) your blood glucose meter by following instructions that came with your meter. °Contact a health care provider if: °· Your blood glucose is at or above 240 mg/dL (13.3 mmol/L) for 2 days in a row. °· You have been sick or have had a fever for 2 days or longer, and you are not getting better. °· You have any of the following problems for more than 6 hours: °? You cannot eat or drink. °? You have nausea or vomiting. °? You have diarrhea. °Get help right away if: °· Your blood glucose is lower than 54 mg/dL (3 mmol/L). °· You become confused or you have trouble thinking clearly. °· You have difficulty breathing. °· You have moderate or large ketone levels in your urine. °Summary °· Monitoring your blood sugar (glucose) is an important part of managing your diabetes (diabetes mellitus). °· Blood glucose monitoring involves checking your blood glucose as often as directed and keeping a record (log) of your results over time. °· Your health care provider will set individualized treatment goals for you. Your goals will be based on your age, other medical conditions you have, and how you respond to diabetes treatment. °· Every time you check your blood glucose,  write down your result. Also write down any notes about things that may be affecting your blood glucose, such as your diet and exercise for the day. °This information is not intended to replace advice given to you by your health care provider. Make sure you discuss any questions you have with your health care provider. °Document Released: 03/02/2003 Document Revised: 12/21/2017 Document Reviewed: 08/09/2015 °Elsevier Patient Education © 2020 [employer] ° °

## 2018-12-30 NOTE — TOC Progression Note (Signed)
Transition of Care [facility]) - Progression Note    Patient Details  Name: Gina [name] MRN: [id number] Date of Birth: 1960/12/04  Transition of Care [facility]) CM/SW [place], [place] Phone Number: 12/30/2018, 8:47 AM  Clinical Narrative:   Received call back from [facility] [place] worker, who confirmed patient has an open case with them.  She requested a call when patient returns home.  Number is [phone].   Expected Discharge Plan: Home/Self Care    Expected Discharge Plan and Services Expected Discharge Plan: Home/Self Care In-house Referral: Clinical Social Work     Living arrangements for the past 2 months: Apartment                                       Social Determinants of Health (SDOH) Interventions    Readmission Risk Interventions Readmission Risk Prevention Plan 11/07/2017  Post Dischage Appt Patient refused  Medication Screening Complete  Transportation Screening Complete  PCP follow-up Patient refused  Some recent data might be hidden

## 2018-12-30 NOTE — TOC Transition Note (Signed)
[facility]) - CM/SW Discharge Note   Patient Details  Name: Gina [name] MRN: [id number] Date of Birth: 1960/10/25  Transition of Care [facility]) CM/SW Contact:  Nila Nephew, LCSW Phone Number: [phone] 12/30/2018, 11:28 AM   Clinical Narrative:   Pt discharging today, met with her at bedside to confirm home needs (see previous TOC note for hx).  Pt is requesting home health RN (to assist with diabetes care primarily) and aide (to assist with personal hygeine). Requesting [facility] as she has used agency in the past- referred and accepted.  Pt reports she has wheelchair and slider board at home, requesting drop arm BC as well which CSW will order. Spoke with pt's daughter on the phone with pt who is aware of services being arranged. Pt notes again that she is in process of getting personal care services (Medicaid) with [facility] with her PCP (has appt 01/02/19). Pt states her neighbor is currently at her home working on cleaning up so that she can get around with her wheelchair more easily. CSW noted that pt has active APS case and will update caseworker pt discharging home today so they can continue following up with her needs in community. Will assist with transportation home if needed.    Final next level of care: [place] Barriers to Discharge: Barriers Resolved   Patient Goals and CMS Choice Patient states their goals for this hospitalization and ongoing recovery are:: "I have an appointment with the Dr on 10/22.  that is for getting help with a personal care worker."      Discharge Placement                  Name of family member notified: daughter [place] Mam Patient and family notified of of transfer: 12/30/18  Discharge Plan and Services In-house Referral: Clinical Social Work                          [facility] Agency: [place] ([place]) Date [place]: 12/30/18 Time [place]: 1127 Representative  spoke with at [place]: [place] ([name]) Interventions     Readmission Risk Interventions Readmission Risk Prevention Plan 11/07/2017  Post Dischage Appt Patient refused  Medication Screening Complete  Transportation Screening Complete  PCP follow-up Patient refused  Some recent data might be hidden

## 2018-12-30 NOTE — Care Management Important Message (Signed)
Important Message  Patient Details IM Letter given to [place]ren [place] SW to present to the Patient Name: Gina [name] MRN: [id number] Date of Birth: 07/01/1960   Medicare Important Message Given:  Yes     Kerin Salen 12/30/2018, 10:50 AM

## 2018-12-31 LAB — CULTURE, BLOOD (ROUTINE X 2): Culture: NO GROWTH

## 2019-01-01 LAB — AEROBIC/ANAEROBIC CULTURE W GRAM STAIN (SURGICAL/DEEP WOUND)

## 2019-01-02 NOTE — Anesthesia Postprocedure Evaluation (Signed)
Anesthesia Post Note  Patient: Gina [name]  Procedure(s) Performed: IRRIGATION AND DEBRIDEMENT LEFT HAND (Left Hand)     Patient location during evaluation: PACU Anesthesia Type: General Level of consciousness: awake and alert Pain management: pain level controlled Vital Signs Assessment: post-procedure vital signs reviewed and stable Respiratory status: spontaneous breathing, nonlabored ventilation, respiratory function stable and patient connected to nasal cannula oxygen Cardiovascular status: blood pressure returned to baseline and stable Postop Assessment: no apparent nausea or vomiting Anesthetic complications: no    Last Vitals:  Vitals:   12/29/18 2031 12/30/18 0401  BP: 124/77 (!) 151/97  Pulse: 93 81  Resp: 20 18  Temp: 37.2 C 36.8 C  SpO2: 95% 99%    Last Pain:  Vitals:   12/30/18 0902  TempSrc:   PainSc: 0-No pain                 Tymere Depuy

## 2019-01-24 LAB — FUNGAL ORGANISM REFLEX

## 2019-01-24 LAB — FUNGUS CULTURE RESULT

## 2019-01-24 LAB — FUNGUS CULTURE WITH STAIN

## 2019-02-02 ENCOUNTER — Other Ambulatory Visit: Payer: Self-pay

## 2019-02-02 ENCOUNTER — Inpatient Hospital Stay (HOSPITAL_COMMUNITY)
Admission: EM | Admit: 2019-02-02 | Discharge: 2019-03-10 | DRG: 853 | Disposition: A | Discharge status: Other Acute Inpatient Hospital | Payer: Medicare HMO | Attending: Internal Medicine | Admitting: Internal Medicine

## 2019-02-02 ENCOUNTER — Emergency Department (HOSPITAL_COMMUNITY): Payer: Medicare HMO

## 2019-02-02 ENCOUNTER — Encounter (HOSPITAL_COMMUNITY)
Admission: EM | Disposition: A | Discharge status: Nursing Home (Includes ICF) | Payer: Self-pay | Source: Home / Self Care | Attending: Internal Medicine

## 2019-02-02 ENCOUNTER — Encounter (HOSPITAL_COMMUNITY): Payer: Self-pay | Admitting: Emergency Medicine

## 2019-02-02 DIAGNOSIS — Z89511 Acquired absence of right leg below knee: Secondary | ICD-10-CM | POA: Diagnosis not present

## 2019-02-02 DIAGNOSIS — Y838 Other surgical procedures as the cause of abnormal reaction of the patient, or of later complication, without mention of misadventure at the time of the procedure: Secondary | ICD-10-CM | POA: Diagnosis not present

## 2019-02-02 DIAGNOSIS — M7989 Other specified soft tissue disorders: Secondary | ICD-10-CM | POA: Diagnosis not present

## 2019-02-02 DIAGNOSIS — K8020 Calculus of gallbladder without cholecystitis without obstruction: Secondary | ICD-10-CM | POA: Diagnosis present

## 2019-02-02 DIAGNOSIS — Z89519 Acquired absence of unspecified leg below knee: Secondary | ICD-10-CM

## 2019-02-02 DIAGNOSIS — K9189 Other postprocedural complications and disorders of digestive system: Secondary | ICD-10-CM | POA: Diagnosis not present

## 2019-02-02 DIAGNOSIS — K567 Ileus, unspecified: Secondary | ICD-10-CM | POA: Diagnosis not present

## 2019-02-02 DIAGNOSIS — R34 Anuria and oliguria: Secondary | ICD-10-CM | POA: Diagnosis not present

## 2019-02-02 DIAGNOSIS — K251 Acute gastric ulcer with perforation: Secondary | ICD-10-CM | POA: Diagnosis not present

## 2019-02-02 DIAGNOSIS — B49 Unspecified mycosis: Secondary | ICD-10-CM | POA: Diagnosis present

## 2019-02-02 DIAGNOSIS — E11622 Type 2 diabetes mellitus with other skin ulcer: Secondary | ICD-10-CM | POA: Diagnosis present

## 2019-02-02 DIAGNOSIS — L89150 Pressure ulcer of sacral region, unstageable: Secondary | ICD-10-CM | POA: Diagnosis present

## 2019-02-02 DIAGNOSIS — Z89512 Acquired absence of left leg below knee: Secondary | ICD-10-CM

## 2019-02-02 DIAGNOSIS — Z6823 Body mass index (BMI) 23.0-23.9, adult: Secondary | ICD-10-CM

## 2019-02-02 DIAGNOSIS — R140 Abdominal distension (gaseous): Secondary | ICD-10-CM | POA: Diagnosis not present

## 2019-02-02 DIAGNOSIS — K668 Other specified disorders of peritoneum: Secondary | ICD-10-CM | POA: Diagnosis present

## 2019-02-02 DIAGNOSIS — Z888 Allergy status to other drugs, medicaments and biological substances status: Secondary | ICD-10-CM

## 2019-02-02 DIAGNOSIS — R230 Cyanosis: Secondary | ICD-10-CM | POA: Diagnosis not present

## 2019-02-02 DIAGNOSIS — I509 Heart failure, unspecified: Secondary | ICD-10-CM | POA: Diagnosis not present

## 2019-02-02 DIAGNOSIS — R54 Age-related physical debility: Secondary | ICD-10-CM | POA: Diagnosis present

## 2019-02-02 DIAGNOSIS — E1110 Type 2 diabetes mellitus with ketoacidosis without coma: Secondary | ICD-10-CM | POA: Diagnosis present

## 2019-02-02 DIAGNOSIS — B952 Enterococcus as the cause of diseases classified elsewhere: Secondary | ICD-10-CM | POA: Diagnosis not present

## 2019-02-02 DIAGNOSIS — Z9114 Patient's other noncompliance with medication regimen: Secondary | ICD-10-CM

## 2019-02-02 DIAGNOSIS — Z8631 Personal history of diabetic foot ulcer: Secondary | ICD-10-CM | POA: Diagnosis not present

## 2019-02-02 DIAGNOSIS — E1100 Type 2 diabetes mellitus with hyperosmolarity without nonketotic hyperglycemic-hyperosmolar coma (NKHHC): Secondary | ICD-10-CM | POA: Diagnosis not present

## 2019-02-02 DIAGNOSIS — J9601 Acute respiratory failure with hypoxia: Secondary | ICD-10-CM | POA: Diagnosis not present

## 2019-02-02 DIAGNOSIS — F1910 Other psychoactive substance abuse, uncomplicated: Secondary | ICD-10-CM | POA: Diagnosis present

## 2019-02-02 DIAGNOSIS — K265 Chronic or unspecified duodenal ulcer with perforation: Secondary | ICD-10-CM | POA: Diagnosis present

## 2019-02-02 DIAGNOSIS — K631 Perforation of intestine (nontraumatic): Secondary | ICD-10-CM | POA: Diagnosis not present

## 2019-02-02 DIAGNOSIS — Z8042 Family history of malignant neoplasm of prostate: Secondary | ICD-10-CM

## 2019-02-02 DIAGNOSIS — M009 Pyogenic arthritis, unspecified: Secondary | ICD-10-CM | POA: Diagnosis present

## 2019-02-02 DIAGNOSIS — F209 Schizophrenia, unspecified: Secondary | ICD-10-CM | POA: Diagnosis not present

## 2019-02-02 DIAGNOSIS — E118 Type 2 diabetes mellitus with unspecified complications: Secondary | ICD-10-CM | POA: Diagnosis not present

## 2019-02-02 DIAGNOSIS — J90 Pleural effusion, not elsewhere classified: Secondary | ICD-10-CM

## 2019-02-02 DIAGNOSIS — E873 Alkalosis: Secondary | ICD-10-CM | POA: Diagnosis not present

## 2019-02-02 DIAGNOSIS — K559 Vascular disorder of intestine, unspecified: Secondary | ICD-10-CM | POA: Diagnosis present

## 2019-02-02 DIAGNOSIS — Z79899 Other long term (current) drug therapy: Secondary | ICD-10-CM

## 2019-02-02 DIAGNOSIS — I70262 Atherosclerosis of native arteries of extremities with gangrene, left leg: Secondary | ICD-10-CM

## 2019-02-02 DIAGNOSIS — R188 Other ascites: Secondary | ICD-10-CM | POA: Diagnosis not present

## 2019-02-02 DIAGNOSIS — R579 Shock, unspecified: Secondary | ICD-10-CM

## 2019-02-02 DIAGNOSIS — G9341 Metabolic encephalopathy: Secondary | ICD-10-CM | POA: Diagnosis present

## 2019-02-02 DIAGNOSIS — L89312 Pressure ulcer of right buttock, stage 2: Secondary | ICD-10-CM | POA: Diagnosis present

## 2019-02-02 DIAGNOSIS — Z91030 Bee allergy status: Secondary | ICD-10-CM

## 2019-02-02 DIAGNOSIS — F419 Anxiety disorder, unspecified: Secondary | ICD-10-CM | POA: Diagnosis present

## 2019-02-02 DIAGNOSIS — Z515 Encounter for palliative care: Secondary | ICD-10-CM | POA: Diagnosis not present

## 2019-02-02 DIAGNOSIS — B377 Candidal sepsis: Secondary | ICD-10-CM | POA: Diagnosis present

## 2019-02-02 DIAGNOSIS — M869 Osteomyelitis, unspecified: Secondary | ICD-10-CM | POA: Diagnosis present

## 2019-02-02 DIAGNOSIS — N179 Acute kidney failure, unspecified: Secondary | ICD-10-CM

## 2019-02-02 DIAGNOSIS — Z66 Do not resuscitate: Secondary | ICD-10-CM | POA: Diagnosis not present

## 2019-02-02 DIAGNOSIS — T8143XA Infection following a procedure, organ and space surgical site, initial encounter: Secondary | ICD-10-CM | POA: Diagnosis not present

## 2019-02-02 DIAGNOSIS — R011 Cardiac murmur, unspecified: Secondary | ICD-10-CM | POA: Diagnosis not present

## 2019-02-02 DIAGNOSIS — Z0189 Encounter for other specified special examinations: Secondary | ICD-10-CM

## 2019-02-02 DIAGNOSIS — Z89022 Acquired absence of left finger(s): Secondary | ICD-10-CM | POA: Diagnosis not present

## 2019-02-02 DIAGNOSIS — E876 Hypokalemia: Secondary | ICD-10-CM | POA: Diagnosis not present

## 2019-02-02 DIAGNOSIS — K255 Chronic or unspecified gastric ulcer with perforation: Secondary | ICD-10-CM | POA: Diagnosis present

## 2019-02-02 DIAGNOSIS — N184 Chronic kidney disease, stage 4 (severe): Secondary | ICD-10-CM | POA: Diagnosis present

## 2019-02-02 DIAGNOSIS — E43 Unspecified severe protein-calorie malnutrition: Secondary | ICD-10-CM | POA: Diagnosis present

## 2019-02-02 DIAGNOSIS — R410 Disorientation, unspecified: Secondary | ICD-10-CM | POA: Diagnosis not present

## 2019-02-02 DIAGNOSIS — K5900 Constipation, unspecified: Secondary | ICD-10-CM | POA: Diagnosis present

## 2019-02-02 DIAGNOSIS — E861 Hypovolemia: Secondary | ICD-10-CM | POA: Diagnosis present

## 2019-02-02 DIAGNOSIS — B182 Chronic viral hepatitis C: Secondary | ICD-10-CM | POA: Diagnosis present

## 2019-02-02 DIAGNOSIS — Z978 Presence of other specified devices: Secondary | ICD-10-CM | POA: Diagnosis not present

## 2019-02-02 DIAGNOSIS — E8809 Other disorders of plasma-protein metabolism, not elsewhere classified: Secondary | ICD-10-CM | POA: Diagnosis present

## 2019-02-02 DIAGNOSIS — N393 Stress incontinence (female) (male): Secondary | ICD-10-CM | POA: Diagnosis present

## 2019-02-02 DIAGNOSIS — Z20828 Contact with and (suspected) exposure to other viral communicable diseases: Secondary | ICD-10-CM | POA: Diagnosis present

## 2019-02-02 DIAGNOSIS — K6389 Other specified diseases of intestine: Secondary | ICD-10-CM | POA: Diagnosis present

## 2019-02-02 DIAGNOSIS — R0603 Acute respiratory distress: Secondary | ICD-10-CM

## 2019-02-02 DIAGNOSIS — N390 Urinary tract infection, site not specified: Secondary | ICD-10-CM | POA: Diagnosis not present

## 2019-02-02 DIAGNOSIS — E1122 Type 2 diabetes mellitus with diabetic chronic kidney disease: Secondary | ICD-10-CM | POA: Diagnosis present

## 2019-02-02 DIAGNOSIS — T68XXXA Hypothermia, initial encounter: Secondary | ICD-10-CM | POA: Diagnosis present

## 2019-02-02 DIAGNOSIS — A419 Sepsis, unspecified organism: Secondary | ICD-10-CM

## 2019-02-02 DIAGNOSIS — L8990 Pressure ulcer of unspecified site, unspecified stage: Secondary | ICD-10-CM | POA: Diagnosis present

## 2019-02-02 DIAGNOSIS — Z1621 Resistance to vancomycin: Secondary | ICD-10-CM | POA: Diagnosis not present

## 2019-02-02 DIAGNOSIS — D6959 Other secondary thrombocytopenia: Secondary | ICD-10-CM | POA: Diagnosis present

## 2019-02-02 DIAGNOSIS — T80219D Unspecified infection due to central venous catheter, subsequent encounter: Secondary | ICD-10-CM | POA: Diagnosis not present

## 2019-02-02 DIAGNOSIS — I739 Peripheral vascular disease, unspecified: Secondary | ICD-10-CM | POA: Diagnosis not present

## 2019-02-02 DIAGNOSIS — S31100A Unspecified open wound of abdominal wall, right upper quadrant without penetration into peritoneal cavity, initial encounter: Secondary | ICD-10-CM | POA: Diagnosis not present

## 2019-02-02 DIAGNOSIS — D62 Acute posthemorrhagic anemia: Secondary | ICD-10-CM | POA: Diagnosis not present

## 2019-02-02 DIAGNOSIS — R6521 Severe sepsis with septic shock: Secondary | ICD-10-CM | POA: Diagnosis present

## 2019-02-02 DIAGNOSIS — B9689 Other specified bacterial agents as the cause of diseases classified elsewhere: Secondary | ICD-10-CM | POA: Diagnosis not present

## 2019-02-02 DIAGNOSIS — J9600 Acute respiratory failure, unspecified whether with hypoxia or hypercapnia: Secondary | ICD-10-CM | POA: Diagnosis not present

## 2019-02-02 DIAGNOSIS — E1152 Type 2 diabetes mellitus with diabetic peripheral angiopathy with gangrene: Secondary | ICD-10-CM | POA: Diagnosis present

## 2019-02-02 DIAGNOSIS — E1165 Type 2 diabetes mellitus with hyperglycemia: Secondary | ICD-10-CM

## 2019-02-02 DIAGNOSIS — Z833 Family history of diabetes mellitus: Secondary | ICD-10-CM

## 2019-02-02 DIAGNOSIS — B1920 Unspecified viral hepatitis C without hepatic coma: Secondary | ICD-10-CM | POA: Diagnosis not present

## 2019-02-02 DIAGNOSIS — K651 Peritoneal abscess: Secondary | ICD-10-CM | POA: Diagnosis present

## 2019-02-02 DIAGNOSIS — E11649 Type 2 diabetes mellitus with hypoglycemia without coma: Secondary | ICD-10-CM | POA: Diagnosis not present

## 2019-02-02 DIAGNOSIS — I96 Gangrene, not elsewhere classified: Secondary | ICD-10-CM | POA: Diagnosis not present

## 2019-02-02 DIAGNOSIS — M461 Sacroiliitis, not elsewhere classified: Secondary | ICD-10-CM | POA: Diagnosis not present

## 2019-02-02 DIAGNOSIS — N170 Acute kidney failure with tubular necrosis: Secondary | ICD-10-CM | POA: Diagnosis not present

## 2019-02-02 DIAGNOSIS — Z794 Long term (current) use of insulin: Secondary | ICD-10-CM

## 2019-02-02 DIAGNOSIS — L89159 Pressure ulcer of sacral region, unspecified stage: Secondary | ICD-10-CM | POA: Diagnosis present

## 2019-02-02 DIAGNOSIS — F329 Major depressive disorder, single episode, unspecified: Secondary | ICD-10-CM | POA: Diagnosis present

## 2019-02-02 DIAGNOSIS — Z9889 Other specified postprocedural states: Secondary | ICD-10-CM

## 2019-02-02 DIAGNOSIS — E119 Type 2 diabetes mellitus without complications: Secondary | ICD-10-CM | POA: Diagnosis not present

## 2019-02-02 DIAGNOSIS — B3789 Other sites of candidiasis: Secondary | ICD-10-CM | POA: Diagnosis present

## 2019-02-02 DIAGNOSIS — K659 Peritonitis, unspecified: Secondary | ICD-10-CM | POA: Diagnosis present

## 2019-02-02 DIAGNOSIS — R000 Tachycardia, unspecified: Secondary | ICD-10-CM | POA: Diagnosis present

## 2019-02-02 DIAGNOSIS — R64 Cachexia: Secondary | ICD-10-CM | POA: Diagnosis not present

## 2019-02-02 DIAGNOSIS — F05 Delirium due to known physiological condition: Secondary | ICD-10-CM | POA: Diagnosis not present

## 2019-02-02 DIAGNOSIS — I10 Essential (primary) hypertension: Secondary | ICD-10-CM | POA: Diagnosis not present

## 2019-02-02 DIAGNOSIS — D703 Neutropenia due to infection: Secondary | ICD-10-CM | POA: Diagnosis not present

## 2019-02-02 DIAGNOSIS — I70261 Atherosclerosis of native arteries of extremities with gangrene, right leg: Secondary | ICD-10-CM | POA: Diagnosis not present

## 2019-02-02 DIAGNOSIS — B379 Candidiasis, unspecified: Secondary | ICD-10-CM | POA: Diagnosis not present

## 2019-02-02 DIAGNOSIS — I129 Hypertensive chronic kidney disease with stage 1 through stage 4 chronic kidney disease, or unspecified chronic kidney disease: Secondary | ICD-10-CM | POA: Diagnosis present

## 2019-02-02 DIAGNOSIS — I70263 Atherosclerosis of native arteries of extremities with gangrene, bilateral legs: Secondary | ICD-10-CM | POA: Diagnosis present

## 2019-02-02 DIAGNOSIS — Z9911 Dependence on respirator [ventilator] status: Secondary | ICD-10-CM | POA: Diagnosis not present

## 2019-02-02 DIAGNOSIS — Z781 Physical restraint status: Secondary | ICD-10-CM

## 2019-02-02 DIAGNOSIS — B961 Klebsiella pneumoniae [K. pneumoniae] as the cause of diseases classified elsewhere: Secondary | ICD-10-CM | POA: Diagnosis not present

## 2019-02-02 DIAGNOSIS — E0810 Diabetes mellitus due to underlying condition with ketoacidosis without coma: Secondary | ICD-10-CM | POA: Diagnosis not present

## 2019-02-02 DIAGNOSIS — E875 Hyperkalemia: Secondary | ICD-10-CM | POA: Diagnosis present

## 2019-02-02 DIAGNOSIS — Z8249 Family history of ischemic heart disease and other diseases of the circulatory system: Secondary | ICD-10-CM

## 2019-02-02 DIAGNOSIS — G9340 Encephalopathy, unspecified: Secondary | ICD-10-CM | POA: Diagnosis not present

## 2019-02-02 HISTORY — PX: LAPAROTOMY: SHX154

## 2019-02-02 LAB — LACTIC ACID, PLASMA
Lactic Acid, Venous: 2.8 mmol/L (ref 0.5–1.9)
Lactic Acid, Venous: 1.3 mmol/L (ref 0.5–1.9)
Lactic Acid, Venous: 2.2 mmol/L (ref 0.5–1.9)

## 2019-02-02 LAB — CBC WITH DIFFERENTIAL/PLATELET
Abs Immature Granulocytes: 0.02 10*3/uL (ref 0.00–0.07)
Basophils Absolute: 0 10*3/uL (ref 0.0–0.1)
Basophils Relative: 0 %
Eosinophils Absolute: 0 10*3/uL (ref 0.0–0.5)
Eosinophils Relative: 0 %
HCT: 32 % — ABNORMAL LOW (ref 36.0–46.0)
Hemoglobin: 9.3 g/dL — ABNORMAL LOW (ref 12.0–15.0)
Immature Granulocytes: 1 %
Lymphocytes Relative: 20 %
Lymphs Abs: 0.8 10*3/uL (ref 0.7–4.0)
MCH: 29.7 pg (ref 26.0–34.0)
MCHC: 29.1 g/dL — ABNORMAL LOW (ref 30.0–36.0)
MCV: 102.2 fL — ABNORMAL HIGH (ref 80.0–100.0)
Monocytes Absolute: 0.3 10*3/uL (ref 0.1–1.0)
Monocytes Relative: 6 %
Neutro Abs: 2.9 10*3/uL (ref 1.7–7.7)
Neutrophils Relative %: 73 %
Platelets: 199 10*3/uL (ref 150–400)
RBC: 3.13 MIL/uL — ABNORMAL LOW (ref 3.87–5.11)
RDW: 13.2 % (ref 11.5–15.5)
WBC Morphology: INCREASED
WBC: 4 10*3/uL (ref 4.0–10.5)
nRBC: 0 % (ref 0.0–0.2)

## 2019-02-02 LAB — I-STAT CHEM 8, ED
BUN: 57 mg/dL — ABNORMAL HIGH (ref 6–20)
BUN: 34 mg/dL — ABNORMAL HIGH (ref 6–20)
Calcium, Ion: 0.98 mmol/L — ABNORMAL LOW (ref 1.15–1.40)
Calcium, Ion: 1.2 mmol/L (ref 1.15–1.40)
Chloride: 91 mmol/L — ABNORMAL LOW (ref 98–111)
Chloride: 95 mmol/L — ABNORMAL LOW (ref 98–111)
Creatinine, Ser: 1.4 mg/dL — ABNORMAL HIGH (ref 0.44–1.00)
Creatinine, Ser: 1.3 mg/dL — ABNORMAL HIGH (ref 0.44–1.00)
Glucose, Bld: >700 mg/dL (ref 70–99)
Glucose, Bld: >700 mg/dL (ref 70–99)
HCT: 31 % — ABNORMAL LOW (ref 36.0–46.0)
HCT: 30 % — ABNORMAL LOW (ref 36.0–46.0)
Hemoglobin: 10.5 g/dL — ABNORMAL LOW (ref 12.0–15.0)
Hemoglobin: 10.2 g/dL — ABNORMAL LOW (ref 12.0–15.0)
Potassium: 7.7 mmol/L (ref 3.5–5.1)
Potassium: 5 mmol/L (ref 3.5–5.1)
Sodium: 118 mmol/L — CL (ref 135–145)
Sodium: 123 mmol/L — ABNORMAL LOW (ref 135–145)
TCO2: 16 mmol/L — ABNORMAL LOW (ref 22–32)
TCO2: 13 mmol/L — ABNORMAL LOW (ref 22–32)

## 2019-02-02 LAB — URINALYSIS, ROUTINE W REFLEX MICROSCOPIC
Bilirubin Urine: NEGATIVE
Glucose, UA: >=500 mg/dL — AB
Ketones, ur: 5 mg/dL — AB
Nitrite: NEGATIVE
Protein, ur: 100 mg/dL — AB
Specific Gravity, Urine: 1.023 (ref 1.005–1.030)
WBC, UA: >50 WBC/hpf — ABNORMAL HIGH (ref 0–5)
pH: 5 (ref 5.0–8.0)

## 2019-02-02 LAB — CK: Total CK: 232 U/L (ref 38–234)

## 2019-02-02 LAB — RAPID URINE DRUG SCREEN, HOSP PERFORMED
Amphetamines: NOT DETECTED
Barbiturates: NOT DETECTED
Benzodiazepines: NOT DETECTED
Cocaine: NOT DETECTED
Opiates: NOT DETECTED
Tetrahydrocannabinol: NOT DETECTED

## 2019-02-02 LAB — CBG MONITORING, ED
Glucose-Capillary: >600 mg/dL (ref 70–99)
Glucose-Capillary: >600 mg/dL (ref 70–99)
Glucose-Capillary: >600 mg/dL (ref 70–99)

## 2019-02-02 LAB — COMPREHENSIVE METABOLIC PANEL
ALT: 22 U/L (ref 0–44)
AST: 30 U/L (ref 15–41)
Albumin: 2.4 g/dL — ABNORMAL LOW (ref 3.5–5.0)
Alkaline Phosphatase: 71 U/L (ref 38–126)
Anion gap: 21 — ABNORMAL HIGH (ref 5–15)
BUN: 42 mg/dL — ABNORMAL HIGH (ref 6–20)
CO2: 14 mmol/L — ABNORMAL LOW (ref 22–32)
Calcium: 9.2 mg/dL (ref 8.9–10.3)
Chloride: 84 mmol/L — ABNORMAL LOW (ref 98–111)
Creatinine, Ser: 1.9 mg/dL — ABNORMAL HIGH (ref 0.44–1.00)
GFR calc Af Amer: 33 mL/min — ABNORMAL LOW (ref >60)
GFR calc non Af Amer: 29 mL/min — ABNORMAL LOW (ref >60)
Glucose, Bld: 1179 mg/dL (ref 70–99)
Potassium: 4.8 mmol/L (ref 3.5–5.1)
Sodium: 119 mmol/L — CL (ref 135–145)
Total Bilirubin: 0.8 mg/dL (ref 0.3–1.2)
Total Protein: 6.3 g/dL — ABNORMAL LOW (ref 6.5–8.1)

## 2019-02-02 LAB — BASIC METABOLIC PANEL
Anion gap: 18 — ABNORMAL HIGH (ref 5–15)
BUN: 38 mg/dL — ABNORMAL HIGH (ref 6–20)
CO2: 12 mmol/L — ABNORMAL LOW (ref 22–32)
Calcium: 8.6 mg/dL — ABNORMAL LOW (ref 8.9–10.3)
Chloride: 94 mmol/L — ABNORMAL LOW (ref 98–111)
Creatinine, Ser: 1.73 mg/dL — ABNORMAL HIGH (ref 0.44–1.00)
GFR calc Af Amer: 37 mL/min — ABNORMAL LOW (ref >60)
GFR calc non Af Amer: 32 mL/min — ABNORMAL LOW (ref >60)
Glucose, Bld: 990 mg/dL (ref 70–99)
Potassium: 5.3 mmol/L — ABNORMAL HIGH (ref 3.5–5.1)
Sodium: 124 mmol/L — ABNORMAL LOW (ref 135–145)

## 2019-02-02 LAB — PROTIME-INR
INR: 1.3 — ABNORMAL HIGH (ref 0.8–1.2)
INR: 1.2 (ref 0.8–1.2)
Prothrombin Time: 16 seconds — ABNORMAL HIGH (ref 11.4–15.2)
Prothrombin Time: 15.2 seconds (ref 11.4–15.2)

## 2019-02-02 LAB — ETHANOL: Alcohol, Ethyl (B): <10 mg/dL (ref <10)

## 2019-02-02 LAB — APTT: aPTT: 27 seconds (ref 24–36)

## 2019-02-02 LAB — POC SARS CORONAVIRUS 2 AG -  ED: SARS Coronavirus 2 Ag: NEGATIVE

## 2019-02-02 SURGERY — LAPAROTOMY, EXPLORATORY
Anesthesia: General

## 2019-02-02 MED ORDER — FLUCONAZOLE IN SODIUM CHLORIDE 200-0.9 MG/100ML-% IV SOLN
200.0000 mg | INTRAVENOUS | Status: DC
  Administered 2019-02-04: 200 mg via INTRAVENOUS
  Filled 2019-02-02: qty 100

## 2019-02-02 MED ORDER — METRONIDAZOLE IN NACL 5-0.79 MG/ML-% IV SOLN
500.0000 mg | Freq: Once | INTRAVENOUS | Status: AC
  Administered 2019-02-02: 500 mg via INTRAVENOUS
  Filled 2019-02-02: qty 100

## 2019-02-02 MED ORDER — LACTATED RINGERS IV BOLUS
1000.0000 mL | Freq: Once | INTRAVENOUS | Status: AC
  Administered 2019-02-02: 1000 mL via INTRAVENOUS

## 2019-02-02 MED ORDER — DEXTROSE 50 % IV SOLN
0.0000 mL | INTRAVENOUS | Status: DC | PRN
  Administered 2019-02-03: 50 mL via INTRAVENOUS
  Administered 2019-02-03: 30 mL via INTRAVENOUS
  Administered 2019-02-04: 35 mL via INTRAVENOUS
  Administered 2019-02-07: 25 mL via INTRAVENOUS
  Filled 2019-02-02 (×5): qty 50

## 2019-02-02 MED ORDER — SODIUM CHLORIDE 0.9 % IV SOLN
2.0000 g | Freq: Once | INTRAVENOUS | Status: AC
  Administered 2019-02-02: 18:00:00 2 g via INTRAVENOUS
  Filled 2019-02-02: qty 2

## 2019-02-02 MED ORDER — SODIUM CHLORIDE 0.9 % IV BOLUS (SEPSIS)
1000.0000 mL | Freq: Once | INTRAVENOUS | Status: AC
  Administered 2019-02-02: 1000 mL via INTRAVENOUS

## 2019-02-02 MED ORDER — NOREPINEPHRINE 4 MG/250ML-% IV SOLN
2.0000 ug/min | INTRAVENOUS | Status: DC
  Administered 2019-02-02: 5 ug/min via INTRAVENOUS
  Filled 2019-02-02 (×2): qty 250

## 2019-02-02 MED ORDER — PROPOFOL 10 MG/ML IV BOLUS
INTRAVENOUS | Status: AC
  Filled 2019-02-02: qty 20

## 2019-02-02 MED ORDER — FLUCONAZOLE IN SODIUM CHLORIDE 400-0.9 MG/200ML-% IV SOLN
400.0000 mg | INTRAVENOUS | Status: AC
  Administered 2019-02-03: 400 mg via INTRAVENOUS
  Filled 2019-02-02: qty 200

## 2019-02-02 MED ORDER — VANCOMYCIN HCL IN DEXTROSE 1-5 GM/200ML-% IV SOLN: 1000.0000 mg | Freq: Once | INTRAVENOUS | Status: DC

## 2019-02-02 MED ORDER — DEXTROSE-NACL 5-0.45 % IV SOLN
INTRAVENOUS | Status: DC
  Administered 2019-02-03: 15:00:00 via INTRAVENOUS

## 2019-02-02 MED ORDER — FENTANYL CITRATE (PF) 250 MCG/5ML IJ SOLN
INTRAMUSCULAR | Status: AC
  Filled 2019-02-02: qty 5

## 2019-02-02 MED ORDER — ONDANSETRON HCL 4 MG/2ML IJ SOLN
INTRAMUSCULAR | Status: AC
  Filled 2019-02-02: qty 2

## 2019-02-02 MED ORDER — SODIUM CHLORIDE 0.9 % IV SOLN
250.0000 mL | INTRAVENOUS | Status: DC
  Administered 2019-02-03 (×2): via INTRAVENOUS
  Administered 2019-02-03: 250 mL via INTRAVENOUS

## 2019-02-02 MED ORDER — INSULIN REGULAR(HUMAN) IN NACL 100-0.9 UT/100ML-% IV SOLN
INTRAVENOUS | Status: DC
  Administered 2019-02-02: 8.5 [IU]/h via INTRAVENOUS
  Administered 2019-02-03: 17 [IU]/h via INTRAVENOUS
  Administered 2019-02-03: 11.5 [IU]/h via INTRAVENOUS
  Administered 2019-02-03: 8 [IU]/h via INTRAVENOUS
  Administered 2019-02-03: 14 [IU]/h via INTRAVENOUS
  Administered 2019-02-03: 16 [IU]/h via INTRAVENOUS
  Administered 2019-02-04: 3.8 [IU]/h via INTRAVENOUS
  Administered 2019-02-04: 0.8 [IU]/h via INTRAVENOUS
  Administered 2019-02-05: 2 [IU]/h via INTRAVENOUS
  Administered 2019-02-05: 3.4 [IU]/h via INTRAVENOUS
  Administered 2019-02-05: 0.4 [IU]/h via INTRAVENOUS
  Filled 2019-02-02 (×5): qty 100

## 2019-02-02 MED ORDER — VANCOMYCIN HCL 10 G IV SOLR
1250.0000 mg | Freq: Once | INTRAVENOUS | Status: AC
  Administered 2019-02-02: 19:00:00 1250 mg via INTRAVENOUS
  Filled 2019-02-02: qty 1250

## 2019-02-02 MED ORDER — SODIUM CHLORIDE 0.9 % IV BOLUS
1000.0000 mL | Freq: Once | INTRAVENOUS | Status: AC
  Administered 2019-02-02: 1000 mL via INTRAVENOUS

## 2019-02-02 MED ORDER — MIDAZOLAM HCL 2 MG/2ML IJ SOLN
INTRAMUSCULAR | Status: AC
  Filled 2019-02-02: qty 2

## 2019-02-02 MED ORDER — SODIUM CHLORIDE 0.9 % IV SOLN
INTRAVENOUS | Status: DC
  Administered 2019-02-03: 01:00:00 via INTRAVENOUS

## 2019-02-02 SURGICAL SUPPLY — 50 items
APPLICATOR COTTON TIP 6 STRL (MISCELLANEOUS) ×1 IMPLANT
APPLICATOR COTTON TIP 6IN STRL (MISCELLANEOUS) ×3
BIOPATCH WHT 1IN DISK W/4.0 H (GAUZE/BANDAGES/DRESSINGS) ×3 IMPLANT
BLADE EXTENDED COATED 6.5IN (ELECTRODE) IMPLANT
BLADE HEX COATED 2.75 (ELECTRODE) ×3 IMPLANT
COVER MAYO STAND STRL (DRAPES) IMPLANT
COVER SURGICAL LIGHT HANDLE (MISCELLANEOUS) ×3 IMPLANT
COVER WAND RF STERILE (DRAPES) IMPLANT
DRAIN CHANNEL 19F RND (DRAIN) ×3 IMPLANT
DRAPE LAPAROSCOPIC ABDOMINAL (DRAPES) ×3 IMPLANT
DRAPE WARM FLUID 44X44 (DRAPES) IMPLANT
ELECT REM PT RETURN 15FT ADLT (MISCELLANEOUS) ×3 IMPLANT
EVACUATOR SILICONE 100CC (DRAIN) ×3 IMPLANT
GAUZE SPONGE 4X4 12PLY STRL (GAUZE/BANDAGES/DRESSINGS) ×3 IMPLANT
GLOVE BIOGEL PI IND STRL 7.0 (GLOVE) ×1 IMPLANT
GLOVE BIOGEL PI INDICATOR 7.0 (GLOVE) ×2
GLOVE SURG SYN 7.5  E (GLOVE) ×2
GLOVE SURG SYN 7.5 E (GLOVE) ×1 IMPLANT
GOWN SPEC L4 XLG W/TWL (GOWN DISPOSABLE) ×3 IMPLANT
GOWN STRL REUS W/ TWL XL LVL3 (GOWN DISPOSABLE) ×3 IMPLANT
GOWN STRL REUS W/TWL LRG LVL3 (GOWN DISPOSABLE) ×3 IMPLANT
GOWN STRL REUS W/TWL XL LVL3 (GOWN DISPOSABLE) ×6
HANDLE SUCTION POOLE (INSTRUMENTS) IMPLANT
KIT BASIN OR (CUSTOM PROCEDURE TRAY) ×3 IMPLANT
KIT TURNOVER KIT A (KITS) IMPLANT
NS IRRIG 1000ML POUR BTL (IV SOLUTION) ×3 IMPLANT
PACK GENERAL/GYN (CUSTOM PROCEDURE TRAY) ×3 IMPLANT
PENCIL SMOKE EVACUATOR (MISCELLANEOUS) IMPLANT
SPONGE LAP 18X18 RF (DISPOSABLE) IMPLANT
STAPLER VISISTAT 35W (STAPLE) ×3 IMPLANT
SUCTION POOLE HANDLE (INSTRUMENTS)
SUT ETHILON 2 0 PS N (SUTURE) ×3 IMPLANT
SUT NOVA NAB GS-21 1 T12 (SUTURE) ×6 IMPLANT
SUT PDS AB 1 CT1 27 (SUTURE) ×3 IMPLANT
SUT PDS AB 1 CTX 36 (SUTURE) IMPLANT
SUT SILK 2 0 (SUTURE)
SUT SILK 2 0 SH (SUTURE) ×6 IMPLANT
SUT SILK 2 0 SH CR/8 (SUTURE) ×3 IMPLANT
SUT SILK 2-0 18XBRD TIE 12 (SUTURE) IMPLANT
SUT SILK 3 0 (SUTURE)
SUT SILK 3 0 SH CR/8 (SUTURE) ×3 IMPLANT
SUT SILK 3-0 18XBRD TIE 12 (SUTURE) IMPLANT
SUT VICRYL 2 0 18  UND BR (SUTURE)
SUT VICRYL 2 0 18 UND BR (SUTURE) IMPLANT
SWAB CULTURE ESWAB REG 1ML (MISCELLANEOUS) ×3 IMPLANT
TAPE CLOTH SURG 6X10 WHT LF (GAUZE/BANDAGES/DRESSINGS) ×3 IMPLANT
TOWEL OR 17X26 10 PK STRL BLUE (TOWEL DISPOSABLE) ×6 IMPLANT
TRAY FOLEY MTR SLVR 16FR STAT (SET/KITS/TRAYS/PACK) IMPLANT
WATER STERILE IRR 1000ML POUR (IV SOLUTION) ×3 IMPLANT
YANKAUER SUCT BULB TIP NO VENT (SUCTIONS) ×3 IMPLANT

## 2019-02-02 NOTE — Anesthesia Preprocedure Evaluation (Signed)
Anesthesia Evaluation  Patient identified by MRN, date of birth, ID band Patient awake    Reviewed: Allergy & Precautions, NPO status , Patient's Chart, lab work & pertinent test results  History of Anesthesia Complications Negative for: history of anesthetic complications  Airway Mallampati: II  TM Distance: >3 FB Neck ROM: Full    Dental  (+) Dental Advisory Given, Partial Lower, Poor Dentition, Missing, Partial Upper   Pulmonary neg pulmonary ROS, neg recent URI,    breath sounds clear to auscultation       Cardiovascular hypertension, Pt. on medications (-) angina+ Peripheral Vascular Disease  (-) Past MI  Rhythm:Regular Rate:Normal     Neuro/Psych PSYCHIATRIC DISORDERS Depression Schizophrenia Confused, abnormal mentation likely 2/2 EtOH and hyperglycemia- normal CT brain    GI/Hepatic (+)     substance abuse  alcohol use, Small bowel pneumatosis   Endo/Other  diabetes, Poorly Controlled, Type 2, Insulin DependentPoorly controlled T2DM- hx osteo right foot, septic arthritis s/p multiple amputations  malnutritition  Presented in DKA- glucose still >600, on insulin gtt  Renal/GU Renal Insufficiency, ARF and CRFRenal diseaseCr 1.3 down from 1.9   Stress incontinence    Musculoskeletal  (+) Arthritis , B/l LE amputee  Left thigh/buttock pressure injury    Abdominal (+)  Abdomen: tender.    Peds  Hematology  (+) anemia , hct 30, plt 199   Anesthesia Other Findings Presented to ED in DKA, abdominal film with large amount of free air- to OR emergently. Found down at home  Septic with leukopenia  Reproductive/Obstetrics negative OB ROS                             Anesthesia Physical  Anesthesia Plan  ASA: IV and emergent  Anesthesia Plan: General   Post-op Pain Management:    Induction: Intravenous, Cricoid pressure planned and Rapid sequence  PONV Risk Score and Plan:  3 and Ondansetron and Treatment may vary due to age or medical condition  Airway Management Planned: Oral ETT  Additional Equipment: Arterial line  Intra-op Plan:   Post-operative Plan: Post-operative intubation/ventilation  Informed Consent: I have reviewed the patients History and Physical, chart, labs and discussed the procedure including the risks, benefits and alternatives for the proposed anesthesia with the patient or authorized representative who has indicated his/her understanding and acceptance.     Only emergency history available  Plan Discussed with: CRNA  Anesthesia Plan Comments:         Anesthesia Quick Evaluation

## 2019-02-02 NOTE — Progress Notes (Signed)
A consult was received from an ED physician for vancomycin & cefepime per pharmacy dosing.  The patient's profile has been reviewed for ht/wt/allergies/indication/available labs.   A one time order has been placed for vancomycin 1250 mg & Cefepime 2 gm and Flagyl 500 mg.    Further antibiotics/pharmacy consults should be ordered by admitting physician if indicated.                       Thank you,  Eudelia Bunch, Pharm.D [phone] 02/02/2019 5:49 PM

## 2019-02-02 NOTE — Progress Notes (Signed)
Pharmacy Antibiotic Note  Gina [name] is a 58 y.o. female admitted on 02/02/2019 with septic shock secondary to peritonitis with perforated viscus. Pharmacy has been consulted for Vancomycin, Zosyn, and Fluconazole dosing.  Plan: Vancomycin 1250mg  IV x 1 given in the ED. Continue with Vancomycin 750mg  IV q24h. Vancomycin levels at steady state, as indicated.  Zosyn 3.375g IV q8h (each dose infused over 4 hours).  Fluconazole 400mg  IV x 1, then 200mg  IV q24h (dose adjusted for CrCl < 50 ml/min).  Monitor renal function, cultures, clinical course.  Height: (P) 5\' 2"  (157.5 cm) Weight: 127 lb 10.3 oz (57.9 kg) IBW/kg (Calculated) : (P) 50.1  Temp (24hrs), Avg:94.7 F (34.8 C), Min:90.1 F (32.3 C), Max:97.3 F (36.3 C)  Recent Labs  Lab 02/02/19 1815 02/02/19 1830 02/02/19 1832 02/02/19 1900 02/02/19 1944 02/02/19 2148 02/02/19 2149 02/02/19 2156  WBC  --   --   --   --  4.0  --   --   --   CREATININE  --  1.40* 1.90*  --   --  1.73*  --  1.30*  LATICACIDVEN 2.8*  --   --  1.3  --   --  2.2*  --     Estimated Creatinine Clearance: 37.8 mL/min (A) (by C-G formula based on SCr of 1.3 mg/dL (H)).    Allergies  Allergen Reactions  . Bee Venom Swelling    Swells up with bee stings   . Metformin And Related Other (See Comments)    Upset stomach    Antimicrobials this admission: 11/22 Vancomycin >> 11/22 Cefepime x 1 11/22 Metronidazole x 1 11/22 Fluconazole >> 11/23 Zosyn >>  Dose adjustments this admission: --  Microbiology results: 11/22 BCx: sent 11/22 UCx: sent 11/22 COVID: negative 11/23 peritoneal culture: sent   Thank you for allowing pharmacy to be a part of this patient's care.   Luiz Ochoa 02/03/2019 2:46 AM

## 2019-02-02 NOTE — ED Notes (Addendum)
Patient daughter notified patient is in the hospital. Gina [name] [phone]
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
## 2019-02-02 NOTE — H&P (Signed)
NAME:  Gina [name], MRN:  [id number], DOB:  10-04-1960, LOS: 0 ADMISSION DATE:  02/02/2019, CONSULTATION DATE:  02/03/19  REFERRING MD:  Sherwood Gambler, CHIEF COMPLAINT:  Abdominal pain    History of present illness   Gina [name] is a 58 y.o. 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 She is accompanied by her daughter who provided history as the patient is unable to do so. Per report, the patient has been complaining of "flu-like symptoms" for the past few days. Endorses fever, chills, N/V, abdominal pain. She lives alone but has a caretaker who lives nearby. She was found on the floor today very altered.  In the ED, she was hypothermic and severely hypotensive. Initial blood glucose >1000. 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 The scan also noted cholelithiasis without cholecystitis and bibasilar airspace disease. CT head NAICA. CXR wnl. While in the ED, she was given 6L IVF and started on low-dose Levophed through a peripheral IV. She was also started on insulin infusion per DKA protocol. She received vancomycin, cefepime, metronidazole and fluconazole.  Surgical consult was called and I spoke with Dr. Lucia Gaskins at the bedside. The family has decided to proceed with operative intervention.  Past Medical History  Poorly controlled DM2 with diabetic foot wounds s/p b/l BKA Recent left phalanx amputation HTN Schizophrenia   Consults:  General Surgery (Dr. Lucia Gaskins)  Procedures:  None  Significant Diagnostic Tests:  WBC 4.0, Hgb 10.2, plt 199 Na 119 (141 corrected), K 4.8, CO2 14, AG 21, BUN 42, Cr 1.90, gluc 1179 TBili 0.8, AST, 30, ALT 22, ALP 71 Lactic acid  2.8>2.2 U/A - >500 gluc, +ketones, mod blood, large leuk, neg nitrite UDS neg  Micro Data:  Blood, urine cultures (11/22) pending  Antimicrobials:  Cefepime 11/22 (1 dose) Metronidazole 11/22 (1 dose) Vancomycin 11/22- Fluconazole 11/22- Zosyn 11/23-   Objective   Blood pressure (!) 105/54, pulse 86, temperature (!) 96.4 F (35.8 C), resp. rate 15, height 5\' 2"  (1.575 m), weight 57.9 kg, last menstrual period 03/29/2015, SpO2 99 %.        Intake/Output Summary (Last 24 hours) at 02/02/2019 2245 Last data filed at 02/02/2019 2238 Gross per 24 hour  Intake 4450 ml  Output -  Net 4450 ml   Filed Weights   02/02/19 1750  Weight: 57.9 kg    Examination: General: altered, ill-appearing, in moderate distress HENT: PERRLA, very dry OMM Lungs: CTA b/l, no w/c/r Cardiovascular: tachycardic, no m/r/g Abdomen: tense, firm, TTP, hypoactive bowel sounds Extremities: s/p b/l BKA, no edema Neuro: oriented to self, not place or time, moving all extremities GU: deferred  Assessment & Plan:  Gina [name] presents with septic shock secondary to peritonitis with perforated viscus found on CT a/p. The etiology is likely ischemic, although it is not quite clear at this time. CT a/p also shows pneumatosis coli and findings consistent with mesenteric ischemia. This is in addition to diabetic ketoacidosis with initial glucose >1000. She will go to the OR for high risk emergent laparotomy with Dr. Lucia Gaskins.  Septic shock 2/2 peritonitis, perforated viscus Probable ischemic colitis, mesenteric ischemia -To OR with General Surgery (Dr. Lucia Gaskins) -Contine vancomycin, Zosyn, fluconazole -Continue aggressive IV hydration -Wean pressors as able -Follow  up culture data -Keep NPO -Active T&S -Further management pending operative intervention  DKA -IV fluids as above -Insulin infusion per protocol -Q4H BMP, Q1 FSBS  Prerenal AKI, improving -Baseline creatinine ~0.50 -Continue IV hydration as  above -Strict I/O, insert and maintain Foley -Avoid nephrotoxins -Renally dose meds for CrCl <50 -Daily BMP, Mg, PO4   Best practice:  Diet: NPO Pain/Anxiety/Delirium protocol (if indicated): N/A VAP protocol (if indicated): Chlorhexidine DVT prophylaxis: SQH GI prophylaxis: PPI Glucose control: Insulin infusion Mobility: Bed rest Code Status: Full Family Communication: Daughter, [name] [phone]) Disposition: ICU  Labs   CBC: Recent Labs  Lab 02/02/19 1830 02/02/19 1944 02/02/19 2156  WBC  --  4.0  --   NEUTROABS  --  2.9  --   HGB 10.5* 9.3* 10.2*  HCT 31.0* 32.0* 30.0*  MCV  --  102.2*  --   PLT  --  199  --     Basic Metabolic Panel: Recent Labs  Lab 02/02/19 1830 02/02/19 1832 02/02/19 2156  NA 118* 119* 123*  K 7.7* 4.8 5.0  CL 91* 84* 95*  CO2  --  14*  --   GLUCOSE >700* 1,179* >700*  BUN 57* 42* 34*  CREATININE 1.40* 1.90* 1.30*  CALCIUM  --  9.2  --    GFR: Estimated Creatinine Clearance: 37.8 mL/min (A) (by C-G formula based on SCr of 1.3 mg/dL (H)). Recent Labs  Lab 02/02/19 1815 02/02/19 1900 02/02/19 1944 02/02/19 2149  WBC  --   --  4.0  --   LATICACIDVEN 2.8* 1.3  --  2.2*    Liver Function Tests: Recent Labs  Lab 02/02/19 1832  AST 30  ALT 22  ALKPHOS 71  BILITOT 0.8  PROT 6.3*  ALBUMIN 2.4*   No results for input(s): LIPASE, AMYLASE in the last 168 hours. No results for input(s): AMMONIA in the last 168 hours.  ABG    Component Value Date/Time   PHART 7.375 12/26/2018 0518   PCO2ART 49.2 (H) 12/26/2018 0518   PO2ART 66.4 (L) 12/26/2018 0518   HCO3 28.0 12/26/2018 0518   TCO2 13 (L) 02/02/2019 2156   ACIDBASEDEF 3.0 (H) 06/18/2013 0422   O2SAT 70.3 02/02/2019 1911     Coagulation Profile: Recent Labs  Lab 02/02/19 1832 02/02/19 1952  INR 1.3* 1.2    Cardiac Enzymes: Recent Labs  Lab 02/02/19 1832  CKTOTAL 232    HbA1C: Hgb A1c MFr Bld  Date/Time Value Ref Range Status  11/25/2018 01:39 PM  >15.5 (H) 4.8 - 5.6 % Final    Comment:    (NOTE) **Verified by repeat analysis**         Prediabetes: 5.7 - 6.4         Diabetes: >6.4         Glycemic control for adults with diabetes: <7.0   05/16/2018 03:15 AM 15.0 (H) 4.8 - 5.6 % Final    Comment:    (NOTE) Pre diabetes:          5.7%-6.4% Diabetes:              >6.4% Glycemic control for   <7.0% adults with diabetes     CBG: Recent Labs  Lab 02/02/19 1808 02/02/19 2222  GLUCAP >600* >600*    Review of Systems:   Unable to obtain due to patient status.  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   Surgical History    Past Surgical History:  Procedure Laterality Date  . AMPUTATION Left 06/05/2016   Procedure: LEFT FOOT TRANSMETATARSAL AMPUTATION;  Surgeon: [name], MD;  Location: [facility];  Service: Orthopedics;  Laterality: Left;  . AMPUTATION Right 11/11/2017   Procedure: AMPUTATION BELOW KNEE;  Surgeon: [name], MD;  Location: [place];  Service: Orthopedics;  Laterality: Right;  . AMPUTATION Left 05/10/2018   Procedure: LEFT BELOW KNEE AMPUTATION;  Surgeon: [name], MD;  Location: [facility];  Service: Orthopedics;  Laterality: Left;  . AMPUTATION Left 12/28/2018   Procedure: AMPUTATION THIRD DIGIT;  Surgeon: Iran Planas, MD;  Location: [facility];  Service: Orthopedics;  Laterality: Left;  . AMPUTATION TOE Right 02/23/2017   Procedure: AMPUTATION RIGHT THIRD TOE;  Surgeon: [name], MD;  Location: [facility];  Service: Orthopedics;  Laterality: Right;  . I&D EXTREMITY Right 11/09/2017   Procedure: Debride Ulcer Right Heel;  Surgeon: [name], MD;  Location: [place];  Service: Orthopedics;  Laterality: Right;  . I&D EXTREMITY Left 12/28/2018   Procedure: IRRIGATION AND  DEBRIDEMENT EXTREMITY;  Surgeon: Iran Planas, MD;  Location: [facility];  Service: Orthopedics;  Laterality: Left;  . INCISION AND DRAINAGE OF WOUND Left 12/26/2018   Procedure: IRRIGATION AND DEBRIDEMENT LEFT HAND;  Surgeon: Iran Planas, MD;  Location: [facility];  Service: Orthopedics;  Laterality: Left;  . TUBAL LIGATION       Social History   reports that she has never smoked. She has never used smokeless tobacco. She reports that she does not drink alcohol or use drugs.   Family History   Her family history includes CAD in her father; Diabetes Mellitus II in her brother; Diabetes type II in her father; Prostate cancer in her father.   Allergies Allergies  Allergen Reactions  . Bee Venom Swelling    Swells up with bee stings   . Metformin And Related Other (See Comments)    Upset stomach     Home Medications  Prior to Admission medications   Medication Sig Start Date End Date Taking? Authorizing Provider  acetaminophen (TYLENOL) 325 MG tablet Take 2 tablets (650 mg total) by mouth every 6 (six) hours as needed for mild pain (or Fever >/= 101). 05/21/18  Yes Roney Jaffe, MD  escitalopram (LEXAPRO) 20 MG tablet TAKE ONE TABLET BY MOUTH EVERY DAY Patient taking differently: Take 20 mg by mouth daily.  04/09/18  Yes Charlott Rakes, MD  gabapentin (NEURONTIN) 100 MG capsule Take 2 capsules (200 mg total) by mouth 2 (two) times daily. 11/28/18 02/02/19 Yes Spongberg, Audie Pinto, MD  insulin aspart (NOVOLOG) 100 UNIT/ML injection Inject 0-15 Units into the skin 3 (three) times daily with meals. 05/21/18  Yes Roney Jaffe, MD  Insulin Glargine (LANTUS) 100 UNIT/ML Solostar Pen Inject 35 Units into the skin daily. 12/30/18  Yes Dhungel, Nishant, MD  Multiple Vitamin (MULTIVITAMIN WITH MINERALS) TABS tablet Take 1 tablet by mouth daily.   Yes [provider]  amLODipine (NORVASC) 5 MG tablet Take 1 tablet (5 mg total) by mouth daily. 11/29/18 12/29/18  Spongberg, Audie Pinto, MD   dextrose 50 % solution Inject 25 mLs (12.5 g total) into the vein as needed for low blood sugar. Patient not taking: Reported on 02/02/2019 05/21/18   Roney Jaffe, MD  doxycycline (VIBRA-TABS) 100 MG tablet Take 1 tablet (100 mg  total) by mouth 2 (two) times daily. Patient not taking: Reported on 02/02/2019 12/30/18   Dhungel, Flonnie Overman, MD  polyethylene glycol (MIRALAX) packet Take 17 g by mouth daily as needed for mild constipation. Patient not taking: Reported on 02/02/2019 05/21/18   Roney Jaffe, MD  senna-docusate (SENOKOT-S) 8.6-50 MG tablet Take 2 tablets by mouth at bedtime as needed for mild constipation. Patient not taking: Reported on 02/02/2019 05/21/18   Roney Jaffe, MD     This patient is critically ill with multiple organ system failure; which, requires frequent high complexity decision making, assessment, support, evaluation, and titration of therapies. This was completed through the application of advanced monitoring technologies and extensive interpretation of multiple databases. During this encounter critical care time was devoted to patient care services described in this note for 51 minutes.

## 2019-02-02 NOTE — ED Notes (Addendum)
Date and time results received: 02/02/19 1922 (use smartphrase ".now" to insert current time)  Test: lactic acid Critical Value: 2.8  Name of Provider Notified: Regenia Skeeter MD  Orders Received? Or Actions Taken?: waiting on orders

## 2019-02-02 NOTE — Progress Notes (Signed)
Called to confirm if patient needed to remain on Airborne/Contact isolation with the POC Covid test negative.  I was advised primary nurse currently in room working with patient, and that Dr. Lucia Gaskins and PCCM were at the bedside evaluating if patient will go to surgery first or not.  When ready patient will be going to room 1225, and report can be called to [facility] @ [phone].  Jacqulyn Ducking ICU/SD RN4 / Care Coordinator / Rapid Response Nurse Rapid Response Number:  [phone] ICU Charge Nurse Number:  [phone]

## 2019-02-02 NOTE — ED Notes (Signed)
case worker called at [phone]. Left message on secure line that patient is in the hospital.

## 2019-02-02 NOTE — ED Notes (Signed)
ED TO INPATIENT HANDOFF REPORT  ED Nurse Name and Phone #: Fredonia Highland [phone]  S Name/Age/Gender Gina [name] 58 y.o. female Room/Bed: WA09/WA09  Code Status   Code Status: Prior  Home/SNF/Other Home Patient oriented to: self Is this baseline? No   Triage Complete: Triage complete  Chief Complaint DKA  Triage Note Patient arrived by EMS from home. Pt was found on the floor at home. Pt has N/V. Patient is urinary and fecal incontinent.   EMS BP 80/46  CBG read high for EMS.    Hx of DM.    Allergies Allergies  Allergen Reactions  . Bee Venom Swelling    Swells up with bee stings   . Metformin And Related Other (See Comments)    Upset stomach    Level of Care/Admitting Diagnosis ED Disposition    ED Disposition Condition [facility] Area: [place] [[id number]]  Level of Care: ICU [6]  Covid Evaluation: Confirmed COVID Negative  Date Laboratory Confirmed COVID Negative: 02/02/2019  Diagnosis: Bowel perforation [facility]) [[id number]]  Admitting Physician: Bennie Pierini [[id number]]  Attending Physician: Jonnie Finner, ADAM ROSS [[id number]]  Estimated length of stay: 5 - 7 days  Certification:: I certify this patient will need inpatient services for at least 2 midnights  PT Class (Do Not Modify): Inpatient [101]  PT Acc Code (Do Not Modify): Private [1]       B Medical/Surgery History Past Medical History:  Diagnosis Date  . Diabetes mellitus   . Hypertension   . Osteomyelitis of right foot ([place]) 02/22/2017  . Schizophrenia ([place])   . Septic arthritis of interphalangeal joint of toe of left foot ([place]) 06/02/2016  . Stress incontinence 04/26/2017   Past Surgical History:  Procedure Laterality Date  . AMPUTATION Left 06/05/2016   Procedure: LEFT FOOT TRANSMETATARSAL AMPUTATION;  Surgeon: [name], MD;  Location: [facility];  Service: Orthopedics;  Laterality: Left;  . AMPUTATION Right 11/11/2017   Procedure: AMPUTATION BELOW KNEE;   Surgeon: [name], MD;  Location: [place];  Service: Orthopedics;  Laterality: Right;  . AMPUTATION Left 05/10/2018   Procedure: LEFT BELOW KNEE AMPUTATION;  Surgeon: [name], MD;  Location: [place];  Service: Orthopedics;  Laterality: Left;  . AMPUTATION Left 12/28/2018   Procedure: AMPUTATION THIRD DIGIT;  Surgeon: Iran Planas, MD;  Location: [facility];  Service: Orthopedics;  Laterality: Left;  . AMPUTATION TOE Right 02/23/2017   Procedure: AMPUTATION RIGHT THIRD TOE;  Surgeon: [name], MD;  Location: [place];  Service: Orthopedics;  Laterality: Right;  . I&D EXTREMITY Right 11/09/2017   Procedure: Debride Ulcer Right Heel;  Surgeon: [name], MD;  Location: [place];  Service: Orthopedics;  Laterality: Right;  . I&D EXTREMITY Left 12/28/2018   Procedure: IRRIGATION AND DEBRIDEMENT EXTREMITY;  Surgeon: Iran Planas, MD;  Location: [facility];  Service: Orthopedics;  Laterality: Left;  . INCISION AND DRAINAGE OF WOUND Left 12/26/2018   Procedure: IRRIGATION AND DEBRIDEMENT LEFT HAND;  Surgeon: Iran Planas, MD;  Location[facility];  Service: Orthopedics;  Laterality: Left;  . TUBAL LIGATION       A IV Location/Drains/Wounds Patient Lines/Drains/Airways Status   Active Line/Drains/Airways    Name:   Placement date:   Placement time:   Site:   Days:   Peripheral IV 02/02/19 Right Forearm   02/02/19    2029    Forearm   less than 1   Peripheral IV 02/02/19 Left Forearm  02/02/19    2230    Forearm   less than 1   Urethral Catheter Sam, NT Temperature probe 14 Fr.   02/02/19    1943    Temperature probe   less than 1   Incision (Closed) 05/10/18 Leg Left   05/10/18    1255     268   Incision (Closed) 12/26/18 Hand Left   12/26/18    1436     38   Incision (Closed) 12/28/18 Hand Left   12/28/18    1203     36   Pressure Injury 12/26/18 Thigh Left;Posterior;Proximal Unstageable - Full thickness tissue loss in which the base of the ulcer is covered by slough (yellow, tan, gray,  green or brown) and/or eschar (tan, brown or black) in the wound bed.   12/26/18    0630     38   Pressure Injury 12/26/18 Buttocks Left Stage II -  Partial thickness loss of dermis presenting as a shallow open ulcer with a red, pink wound bed without slough.   12/26/18    0630     38   Pressure Injury 12/26/18 Buttocks Right;Lower Stage II -  Partial thickness loss of dermis presenting as a shallow open ulcer with a red, pink wound bed without slough.   12/26/18    0630     38   Wound / Incision (Open or Dehisced) 12/26/18 Other (Comment) Finger (Comment which one) Left   12/26/18    0630    Finger (Comment which one)   38          Intake/Output Last 24 hours  Intake/Output Summary (Last 24 hours) at 02/02/2019 2259 Last data filed at 02/02/2019 2238 Gross per 24 hour  Intake 4450 ml  Output -  Net 4450 ml    Labs/Imaging Results for orders placed or performed during the hospital encounter of 02/02/19 (from the past 48 hour(s))  Urinalysis, Routine w reflex microscopic     Status: Abnormal   Collection Time: 02/02/19  5:44 PM  Result Value Ref Range   Color, Urine AMBER (A) YELLOW    Comment: BIOCHEMICALS MAY BE AFFECTED BY COLOR   APPearance CLOUDY (A) CLEAR   Specific Gravity, Urine 1.023 1.005 - 1.030   pH 5.0 5.0 - 8.0   Glucose, UA >=500 (A) NEGATIVE mg/dL   Hgb urine dipstick MODERATE (A) NEGATIVE   Bilirubin Urine NEGATIVE NEGATIVE   Ketones, ur 5 (A) NEGATIVE mg/dL   Protein, ur 100 (A) NEGATIVE mg/dL   Nitrite NEGATIVE NEGATIVE   Leukocytes,Ua LARGE (A) NEGATIVE   RBC / HPF 11-20 0 - 5 RBC/hpf   WBC, UA >50 (H) 0 - 5 WBC/hpf   Bacteria, UA FEW (A) NONE SEEN   Squamous Epithelial / LPF 0-5 0 - 5   WBC Clumps PRESENT    Mucus PRESENT    Granular Casts, UA PRESENT    Amorphous Crystal PRESENT     Comment: Performed at [facility], [place] [address] [place], [place] [address]  Urine rapid drug screen (hosp performed)     Status: None   Collection  Time: 02/02/19  6:03 PM  Result Value Ref Range   Opiates NONE DETECTED NONE DETECTED   Cocaine NONE DETECTED NONE DETECTED   Benzodiazepines NONE DETECTED NONE DETECTED   Amphetamines NONE DETECTED NONE DETECTED   Tetrahydrocannabinol NONE DETECTED NONE DETECTED   Barbiturates NONE DETECTED NONE DETECTED    Comment: (NOTE) DRUG SCREEN  FOR MEDICAL PURPOSES ONLY.  IF CONFIRMATION IS NEEDED FOR ANY PURPOSE, NOTIFY LAB WITHIN 5 DAYS. LOWEST DETECTABLE LIMITS FOR URINE DRUG SCREEN Drug Class                     Cutoff (ng/mL) Amphetamine and metabolites    1000 Barbiturate and metabolites    200 Benzodiazepine                 161 Tricyclics and metabolites     300 Opiates and metabolites        300 Cocaine and metabolites        300 THC                            50 Performed at [facility], [place] [address], [place], [place] [address]   CBG monitoring, ED     Status: Abnormal   Collection Time: 02/02/19  6:08 PM  Result Value Ref Range   Glucose-Capillary >600 (HH) 70 - 99 mg/dL  Lactic acid, plasma     Status: Abnormal   Collection Time: 02/02/19  6:15 PM  Result Value Ref Range   Lactic Acid, Venous 2.8 (HH) 0.5 - 1.9 mmol/L    Comment: CRITICAL RESULT CALLED TO, READ BACK BY AND VERIFIED WITH: J.Mallory Enriques,RN [id number] _0  BY V.WILKINS Performed at [place] [address] [place], [place] [address]   I-Stat Chem 8, ED     Status: Abnormal   Collection Time: 02/02/19  6:30 PM  Result Value Ref Range   Sodium 118 (LL) 135 - 145 mmol/L   Potassium 7.7 (HH) 3.5 - 5.1 mmol/L   Chloride 91 (L) 98 - 111 mmol/L   BUN 57 (H) 6 - 20 mg/dL   Creatinine, Ser 1.40 (H) 0.44 - 1.00 mg/dL   Glucose, Bld >700 (HH) 70 - 99 mg/dL   Calcium, Ion 0.98 (L) 1.15 - 1.40 mmol/L   TCO2 16 (L) 22 - 32 mmol/L   Hemoglobin 10.5 (L) 12.0 - 15.0 g/dL   HCT 31.0 (L) 36.0 - 46.0 %   Comment NOTIFIED PHYSICIAN   Comprehensive metabolic panel     Status: Abnormal    Collection Time: 02/02/19  6:32 PM  Result Value Ref Range   Sodium 119 (LL) 135 - 145 mmol/L    Comment: CRITICAL RESULT CALLED TO, READ BACK BY AND VERIFIED WITH: J.OXIDINE,RN [id number] _1  BY V.WILKINS    Potassium 4.8 3.5 - 5.1 mmol/L   Chloride 84 (L) 98 - 111 mmol/L   CO2 14 (L) 22 - 32 mmol/L   Glucose, Bld 1,179 (HH) 70 - 99 mg/dL    Comment: J.OXIDINE,RN [id number] _2  BY V.WILKINS   BUN 42 (H) 6 - 20 mg/dL   Creatinine, Ser 1.90 (H) 0.44 - 1.00 mg/dL   Calcium 9.2 8.9 - 10.3 mg/dL   Total Protein 6.3 (L) 6.5 - 8.1 g/dL   Albumin 2.4 (L) 3.5 - 5.0 g/dL   AST 30 15 - 41 U/L   ALT 22 0 - 44 U/L   Alkaline Phosphatase 71 38 - 126 U/L   Total Bilirubin 0.8 0.3 - 1.2 mg/dL   GFR calc non Af Amer 29 (L) >60 mL/min   GFR calc Af Amer 33 (L) >60 mL/min   Anion gap 21 (H) 5 - 15    Comment: Performed at [facility], [place] [address], [place],  [address]  Protime-INR     Status: Abnormal   Collection Time: 02/02/19  6:32 PM  Result Value Ref Range   Prothrombin Time 16.0 (H) 11.4 - 15.2 seconds    Comment: QUESTIONABLE RESULTS, RECOMMEND RECOLLECT TO VERIFY   INR 1.3 (H) 0.8 - 1.2    Comment: QUESTIONABLE RESULTS, RECOMMEND RECOLLECT TO VERIFY (NOTE) INR goal varies based on device and disease states. Performed at [facility], [place] [address] [place], [place] [address]   CK     Status: None   Collection Time: 02/02/19  6:32 PM  Result Value Ref Range   Total CK 232 38 - 234 U/L    Comment: Performed at [facility], [place] [address], [place], [place] [address]  Ethanol     Status: None   Collection Time: 02/02/19  6:34 PM  Result Value Ref Range   Alcohol, Ethyl (B) <10 <10 mg/dL    Comment: (NOTE) Lowest detectable limit for serum alcohol is 10 mg/dL. For medical purposes only. Performed at [facility], [place] [address] [place], [place] [address]   POC SARS Coronavirus 2 Ag-ED - Nasal  Swab (BD Veritor Kit)     Status: None   Collection Time: 02/02/19  6:54 PM  Result Value Ref Range   SARS Coronavirus 2 Ag NEGATIVE NEGATIVE    Comment: (NOTE) SARS-CoV-2 antigen PRESENT. Positive results indicate the presence of viral antigens, but clinical correlation with patient history and other diagnostic information is necessary to determine patient infection status.  Positive results do not rule out bacterial infection or co-infection  with other viruses. False positive results are rare but can occur, and confirmatory RT-PCR testing may be appropriate in some circumstances. The expected result is Negative. Fact Sheet for Patients: PodPark.tn Fact Sheet for Providers: GiftContent.is  This test is not yet approved or cleared by the [place] FDA and  has been authorized for detection and/or diagnosis of SARS-CoV-2 by FDA under an Emergency Use Authorization (EUA).  This EUA will remain in effect (meaning this test can be used) for the duration of  the COVID-19 declaration under Section 564(b)(1) of the Act, 21 U.S.C. section 360bbb-3(b)(1), unless the a uthorization is terminated or revoked sooner.   Lactic acid, plasma     Status: None   Collection Time: 02/02/19  7:00 PM  Result Value Ref Range   Lactic Acid, Venous 1.3 0.5 - 1.9 mmol/L    Comment: Performed at [facility], [place] [address], [place], [place] [address]  Blood gas, venous     Status: Abnormal (Preliminary result)   Collection Time: 02/02/19  7:11 PM  Result Value Ref Range   pH, Ven PENDING 7.250 - 7.430   pCO2, Ven 35.8 (L) 44.0 - 60.0 mmHg   pO2, Ven 71.7 (H) 32.0 - 45.0 mmHg   O2 Saturation 70.3 %   Patient temperature 98.6     Comment: Performed at [facility], [place] [address] [place], [place] [address]  CBC with Differential     Status: Abnormal   Collection Time: 02/02/19  7:44 PM  Result Value Ref  Range   WBC 4.0 4.0 - 10.5 K/uL   RBC 3.13 (L) 3.87 - 5.11 MIL/uL   Hemoglobin 9.3 (L) 12.0 - 15.0 g/dL   HCT 32.0 (L) 36.0 - 46.0 %   MCV 102.2 (H) 80.0 - 100.0 fL   MCH 29.7 26.0 - 34.0 pg   MCHC 29.1 (L) 30.0 -  36.0 g/dL   RDW 13.2 11.5 - 15.5 %   Platelets 199 150 - 400 K/uL   nRBC 0.0 0.0 - 0.2 %   Neutrophils Relative % 73 %   Neutro Abs 2.9 1.7 - 7.7 K/uL   Lymphocytes Relative 20 %   Lymphs Abs 0.8 0.7 - 4.0 K/uL   Monocytes Relative 6 %   Monocytes Absolute 0.3 0.1 - 1.0 K/uL   Eosinophils Relative 0 %   Eosinophils Absolute 0.0 0.0 - 0.5 K/uL   Basophils Relative 0 %   Basophils Absolute 0.0 0.0 - 0.1 K/uL   WBC Morphology INCREASED BANDS (>20% BANDS)     Comment: VACUOLATED NEUTROPHILS   Immature Granulocytes 1 %   Abs Immature Granulocytes 0.02 0.00 - 0.07 K/uL    Comment: Performed at [facility], [place] [address], [place], [place] [address]  Protime-INR     Status: None   Collection Time: 02/02/19  7:52 PM  Result Value Ref Range   Prothrombin Time 15.2 11.4 - 15.2 seconds   INR 1.2 0.8 - 1.2    Comment: (NOTE) INR goal varies based on device and disease states. Performed at [facility], [place] [address] [place], [place] [address]   APTT     Status: None   Collection Time: 02/02/19  7:52 PM  Result Value Ref Range   aPTT 27 24 - 36 seconds    Comment: Performed at [facility], [place] [address], [place], [place] [address]  Lactic acid, plasma     Status: Abnormal   Collection Time: 02/02/19  9:49 PM  Result Value Ref Range   Lactic Acid, Venous 2.2 (HH) 0.5 - 1.9 mmol/L    Comment: CRITICAL RESULT CALLED TO, READ BACK BY AND VERIFIED WITH: J OXENDINE,RN 02/02/19 2220 RHOLMES Performed at [facility], [place] [address], [place], [place] [address]   I-stat chem 8, ED (not at [facility])     Status: Abnormal   Collection Time: 02/02/19  9:56 PM  Result Value Ref Range   Sodium 123  (L) 135 - 145 mmol/L   Potassium 5.0 3.5 - 5.1 mmol/L   Chloride 95 (L) 98 - 111 mmol/L   BUN 34 (H) 6 - 20 mg/dL   Creatinine, Ser 1.30 (H) 0.44 - 1.00 mg/dL   Glucose, Bld >700 (HH) 70 - 99 mg/dL   Calcium, Ion 1.20 1.15 - 1.40 mmol/L   TCO2 13 (L) 22 - 32 mmol/L   Hemoglobin 10.2 (L) 12.0 - 15.0 g/dL   HCT 30.0 (L) 36.0 - 46.0 %   Comment NOTIFIED PHYSICIAN   CBG monitoring, ED     Status: Abnormal   Collection Time: 02/02/19 10:22 PM  Result Value Ref Range   Glucose-Capillary >600 (HH) 70 - 99 mg/dL   Ct Abdomen Pelvis Wo Contrast  Result Date: 02/02/2019 CLINICAL DATA:  Abdominal pain. EXAM: CT ABDOMEN AND PELVIS WITHOUT CONTRAST TECHNIQUE: Multidetector CT imaging of the abdomen and pelvis was performed following the standard protocol without IV contrast. COMPARISON:  06/15/2013 FINDINGS: Lower chest: There is mild airspace disease at the lung bases bilaterally.The heart size is normal. The intracardiac blood pool is hypodense relative to the adjacent myocardium consistent with anemia. Hepatobiliary: The liver is normal. Cholelithiasis without acute inflammation.There is no biliary ductal dilation. Pancreas: Normal contours without ductal dilatation. No peripancreatic fluid collection. Spleen: No splenic laceration or hematoma. Adrenals/Urinary Tract: --Adrenal glands: No adrenal hemorrhage. --Right kidney/ureter:  No hydronephrosis or perinephric hematoma. --Left kidney/ureter: No hydronephrosis or perinephric hematoma. --Urinary bladder: The bladder is decompressed with a Foley catheter which limits evaluation. Stomach/Bowel: --Stomach/Duodenum: There is some wall thickening of the distal esophagus. The stomach is mildly distended. --Small bowel: There is pneumatosis involving multiple small bowel loops. The small bowel is mildly dilated without evidence for a high-grade obstruction. --Colon: No focal abnormality. --Appendix: Normal. Vascular/Lymphatic: There are minimal atherosclerotic  changes of the abdominal aorta. Gas is noted within the SMV and draining mesenteric veins. --there are some prominent retroperitoneal lymph nodes that are suboptimally evaluated in the absence of IV contrast. --No mesenteric lymphadenopathy. --No pelvic or inguinal lymphadenopathy. Reproductive: Unremarkable Other: There is a large amount of free air within the abdomen. There is a moderate volume of somewhat complex free fluid in the abdomen. There is mild body wall edema. Musculoskeletal. No acute displaced fractures. IMPRESSION: 1. Findings consistent with a hollow viscus perforation as evidence by a large volume of free air and a moderate volume a complex free fluid throughout the abdomen. 2. 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 Gas is noted within the SMV and draining mesenteric veins. Findings are concerning for mesenteric ischemia 3. There is cholelithiasis without secondary signs of acute cholecystitis. 4. Bibasilar airspace disease may represent atelectasis, pneumonia, or aspiration in the appropriate clinical setting. These results were called by telephone at the time of interpretation on 02/02/2019 at 9:25 pm to provider Sherwood Gambler , who verbally acknowledged these results. Electronically Signed   By: Constance Holster M.D.   On: 02/02/2019 21:42   Ct Head Wo Contrast  Result Date: 02/02/2019 CLINICAL DATA:  Found on the floor at home.  Nausea and vomiting. EXAM: CT HEAD WITHOUT CONTRAST TECHNIQUE: Contiguous axial images were obtained from the base of the skull through the vertex without intravenous contrast. COMPARISON:  03/13/2018 FINDINGS: Brain: No evidence of acute infarction, hemorrhage, hydrocephalus, extra-axial collection or mass lesion/mass effect. Vascular: No hyperdense vessel or unexpected calcification. Skull: Normal. Negative for fracture or focal lesion. Sinuses/Orbits: Globes and orbits are unremarkable. Visualized sinuses and mastoid air cells are  clear. Other: None. IMPRESSION: Normal enhanced CT scan of the brain. Electronically Signed   By: Lajean Manes M.D.   On: 02/02/2019 21:38   Dg Chest Port 1 View  Result Date: 02/02/2019 CLINICAL DATA:  Patient arrived by EMS from home. Pt was found on the floor at home. Pt has N/V. Patient is urinary and fecal incontinent. Hx of DM and HTN. EXAM: PORTABLE CHEST 1 VIEW COMPARISON:  04/01/2016 FINDINGS: Cardiac silhouette is normal in size. No mediastinal or hilar masses. Lung volumes are low. Lungs are clear. No pleural effusion or pneumothorax. Skeletal structures are grossly intact. IMPRESSION: No active disease. Electronically Signed   By: Lajean Manes M.D.   On: 02/02/2019 19:56    Pending Labs Unresulted Labs (From admission, onward)    Start     Ordered   02/02/19 2216  Osmolality  ONCE - STAT,   STAT     02/02/19 2215   02/02/19 2156  Lactic acid, plasma  Now then every 2 hours,   STAT     02/02/19 2156   02/02/19 6546  Basic metabolic panel  (Hyperglycemic Hyperosmolar State (HHS))  STAT Now then every 4 hours ,   STAT     02/02/19 2148   02/02/19 2113  SARS Coronavirus 2 by RT PCR (hospital order, performed in [facility] hospital lab) Nasopharyngeal Nasopharyngeal Swab  ONCE - STAT,   STAT    Question Answer Comment  Is this test for diagnosis or screening Screening   Symptomatic for COVID-19 as defined by CDC No   Hospitalized for COVID-19 No   Admitted to ICU for COVID-19 No   Previously tested for COVID-19 Yes   Resident in a congregate (group) care setting No   Employed in healthcare setting No   Pregnant No      02/02/19 2112   02/02/19 1745  Type and screen [place],   STAT    Comments: [place]    02/02/19 1745   02/02/19 1744  CBC WITH DIFFERENTIAL  ONCE - STAT,   STAT     02/02/19 1745   02/02/19 1744  Blood Culture (routine x 2)  BLOOD CULTURE X 2,   STAT     02/02/19 1745   02/02/19 1744  Urine  culture  ONCE - STAT,   STAT     02/02/19 1745          Vitals/Pain Today's Vitals   02/02/19 2200 02/02/19 2215 02/02/19 2230 02/02/19 2246  BP: (!) 89/48 (!) 93/48 (!) 105/54 (!) 89/52  Pulse: 80 80 86 85  Resp: _0 (!) 24  Temp: (!) 96.3 F (35.7 C) (!) 96.3 F (35.7 C) (!) 96.4 F (35.8 C) (!) 96.6 F (35.9 C)  TempSrc:      SpO2: 97% 100% 99% 98%  Weight:      Height:      PainSc:    0-No pain    Isolation Precautions Airborne and Contact precautions  Medications Medications  0.9 %  sodium chloride infusion (has no administration in time range)  norepinephrine (LEVOPHED) 60m in 2537mpremix infusion (5 mcg/min Intravenous New Bag/Given (Non-Interop) 02/02/19 2230)  insulin regular, human (MYXREDLIN) 100 units/ 100 mL infusion (8.5 Units/hr Intravenous New Bag/Given 02/02/19 2224)  0.9 %  sodium chloride infusion (has no administration in time range)  dextrose 5 %-0.45 % sodium chloride infusion (has no administration in time range)  dextrose 50 % solution 0-50 mL (has no administration in time range)  sodium chloride 0.9 % bolus 1,000 mL (0 mLs Intravenous Stopped 02/02/19 1956)    And  sodium chloride 0.9 % bolus 1,000 mL (0 mLs Intravenous Stopped 02/02/19 2237)  ceFEPIme (MAXIPIME) 2 g in sodium chloride 0.9 % 100 mL IVPB (0 g Intravenous Stopped 02/02/19 2030)  metroNIDAZOLE (FLAGYL) IVPB 500 mg (0 mg Intravenous Stopped 02/02/19 2237)  vancomycin (VANCOCIN) 1,250 mg in sodium chloride 0.9 % 250 mL IVPB (0 mg Intravenous Stopped 02/02/19 2237)  sodium chloride 0.9 % bolus 1,000 mL (0 mLs Intravenous Stopped 02/02/19 2237)  lactated ringers bolus 1,000 mL (0 mLs Intravenous Stopped 02/02/19 2238)  sodium chloride 0.9 % bolus 1,000 mL (1,000 mLs Intravenous New Bag/Given 02/02/19 2039)  lactated ringers bolus 1,000 mL (1,000 mLs Intravenous New Bag/Given 02/02/19 2232)  lactated ringers bolus 1,000 mL (1,000 mLs Intravenous New Bag/Given 02/02/19 2232)   lactated ringers bolus 1,000 mL (1,000 mLs Intravenous New Bag/Given 02/02/19 2232)  ondansetron (ZOFRAN) 4 MG/2ML injection (has no administration in time range)  propofol (DIPRIVAN) 10 mg/mL bolus/IV push (has no administration in time range)  fentaNYL (SUBLIMAZE) 250 MCG/5ML injection (has no administration in time range)  midazolam (VERSED) 2 MG/2ML injection (has no administration in time range)    Mobility non-ambulatory High fall risk   Focused Assessments Cardiac Assessment  Handoff:  Cardiac Rhythm: Normal sinus rhythm Lab Results  Component Value Date   CKTOTAL 232 02/02/2019   CKMB 2.9 10/04/2010   TROPONINI <0.30 06/18/2013   Lab Results  Component Value Date   DDIMER <0.27 04/01/2016   Does the Patient currently have chest pain? No     R Recommendations: See Admitting Provider Note  Report given to: shannelle RN  Additional Notes:

## 2019-02-02 NOTE — ED Triage Notes (Signed)
Patient arrived by EMS from home. Pt was found on the floor at home. Pt has N/V. Patient is urinary and fecal incontinent.   EMS BP 80/46  CBG read high for EMS.    Hx of DM.

## 2019-02-02 NOTE — ED Notes (Signed)
Date and time results received: 02/02/19 2334 (use smartphrase ".now" to insert current time)  Test: Glucose Critical Value: 990  Name of Provider Notified:goldston md  Orders Received? Or Actions Taken?: Patient on insulin drip

## 2019-02-02 NOTE — Consult Note (Addendum)
Re:   Gina [name] DOB:   1960-11-12 MRN:   [id number]  ASSESEMENT AND PLAN: 1.  Pneumoperitoneum  She has a large amount of intra-abdominal fluid, presumably from the perforation.  The patient is not able to give her own consent or understand what is going on.  I spoke to the daughter, Tanecia Highsmith, in person, and the other family members by phone.  I reviewed that there is a good chance she will not survive this event, with or without surgery.  And I pointed out that this illness has been a long time coming.  Dr. Dagoberto Reef of [facility] is seeing the patient and, if she survives, she is probably looking at a long critical stay.  I discussed possible bowel resection, ostomy, repeat operations, and death.  The family would like to proceed with surgery.  2.  Pneumatosis of the small bowel - unclear the status of the small bowel.  3.  DM  Uncontrolled - originally 1,179 - 02/02/2019  History of non adherance 4.  Acute renal injury  Creatinine - 1.30 - 02/02/2019 5.  Bilateral BKA 6.  Schizophrenia 7.  Left thigh/buttock pressure injury 8.  Covid status unclear  Chief Complaint  Patient presents with  . Altered Mental Status  . Hyperglycemia   PHYSICIAN REQUESTING CONSULTATION:  Dr. Verta Ellen, [facility]  HISTORY OF PRESENT ILLNESS: Gina [name] is a 58 y.o. (DOB: Aug 03, 1960)  AA  female whose primary care physician is [name], MD.  Most of her history is obtained by her daughter, Fatimah Krolczyk [id number])   She was admitted form 12/25/2018 - 12/30/2018 for left hand infection and poorly controlled DM.  She was found by EMS at home. She was found on the floor.   She has N/V. She has urinary and fecal incontinent. She was brought to the [facility] ER by ambulance  CT scan - 02/02/2019 - 1. Findings consistent with a hollow viscus perforation as evidence by a large volume of free air and a moderate volume a complex free fluid throughout the abdomen. 2. Extensive pneumatosis involving  multiple loops of small bowel. Gas is noted within the SMV and draining mesenteric veins. Findings are concerning for mesenteric ischemia 3. There is cholelithiasis without secondary signs of acute cholecystitis. 4. Bibasilar airspace disease may represent atelectasis, pneumonia, or aspiration in the appropriate clinical setting.  CT head - 03/04/2019 - Normal enhanced CT scan of the brain.  WBC - 02/02/2019 - 4,000 (ominously low for her findings)   Ms. Warsame was found down at home and brought to the [facility].    Past Medical History:  Diagnosis Date  . Diabetes mellitus   . Hypertension   . Osteomyelitis of right foot ([place]) 02/22/2017  . Schizophrenia ([place])   . Septic arthritis of interphalangeal joint of toe of left foot ([place]) 06/02/2016  . Stress incontinence 04/26/2017      Past Surgical History:  Procedure Laterality Date  . AMPUTATION Left 06/05/2016   Procedure: LEFT FOOT TRANSMETATARSAL AMPUTATION;  Surgeon: [name], MD;  Location: [facility];  Service: Orthopedics;  Laterality: Left;  . AMPUTATION Right 11/11/2017   Procedure: AMPUTATION BELOW KNEE;  Surgeon: [name], MD;  Location: [place];  Service: Orthopedics;  Laterality: Right;  . AMPUTATION Left 05/10/2018   Procedure: LEFT BELOW KNEE AMPUTATION;  Surgeon: [name], MD;  Location: [place];  Service: Orthopedics;  Laterality: Left;  . AMPUTATION Left 12/28/2018   Procedure: AMPUTATION THIRD DIGIT;  Surgeon:  Iran Planas, MD;  Location: [facility];  Service: Orthopedics;  Laterality: Left;  . AMPUTATION TOE Right 02/23/2017   Procedure: AMPUTATION RIGHT THIRD TOE;  Surgeon: [name], MD;  Location: [place];  Service: Orthopedics;  Laterality: Right;  . I&D EXTREMITY Right 11/09/2017   Procedure: Debride Ulcer Right Heel;  Surgeon: [name], MD;  Location: [place];  Service: Orthopedics;  Laterality: Right;  . I&D EXTREMITY Left 12/28/2018   Procedure: IRRIGATION AND DEBRIDEMENT EXTREMITY;  Surgeon:  Iran Planas, MD;  Location: [facility];  Service: Orthopedics;  Laterality: Left;  . INCISION AND DRAINAGE OF WOUND Left 12/26/2018   Procedure: IRRIGATION AND DEBRIDEMENT LEFT HAND;  Surgeon: Iran Planas, MD;  Location: [facility];  Service: Orthopedics;  Laterality: Left;  . TUBAL LIGATION        Current Facility-Administered Medications  Medication Dose Route Frequency Provider Last Rate Last Dose  . 0.9 %  sodium chloride infusion  250 mL Intravenous Continuous Sherwood Gambler, MD      . 0.9 %  sodium chloride infusion   Intravenous Continuous Sherwood Gambler, MD      . dextrose 5 %-0.45 % sodium chloride infusion   Intravenous Continuous Sherwood Gambler, MD      . dextrose 50 % solution 0-50 mL  0-50 mL Intravenous PRN Sherwood Gambler, MD      . insulin regular, human (MYXREDLIN) 100 units/ 100 mL infusion   Intravenous Continuous Sherwood Gambler, MD      . lactated ringers bolus 1,000 mL  1,000 mL Intravenous Once Sherwood Gambler, MD      . lactated ringers bolus 1,000 mL  1,000 mL Intravenous Once Sherwood Gambler, MD      . lactated ringers bolus 1,000 mL  1,000 mL Intravenous Once Sherwood Gambler, MD      . norepinephrine (LEVOPHED) 4mg  in 230mL premix infusion  2-10 mcg/min Intravenous Titrated Sherwood Gambler, MD       Current Outpatient Medications  Medication Sig Dispense Refill  . acetaminophen (TYLENOL) 325 MG tablet Take 2 tablets (650 mg total) by mouth every 6 (six) hours as needed for mild pain (or Fever >/= 101).    [name] escitalopram (LEXAPRO) 20 MG tablet TAKE ONE TABLET BY MOUTH EVERY DAY (Patient taking differently: Take 20 mg by mouth daily. ) 30 tablet 0  . gabapentin (NEURONTIN) 100 MG capsule Take 2 capsules (200 mg total) by mouth 2 (two) times daily. 120 capsule 0  . insulin aspart (NOVOLOG) 100 UNIT/ML injection Inject 0-15 Units into the skin 3 (three) times daily with meals. 10 mL 11  . Insulin Glargine (LANTUS) 100 UNIT/ML Solostar Pen Inject 35 Units into the skin  daily. 15 mL 11  . Multiple Vitamin (MULTIVITAMIN WITH MINERALS) TABS tablet Take 1 tablet by mouth daily.    [name] amLODipine (NORVASC) 5 MG tablet Take 1 tablet (5 mg total) by mouth daily. 30 tablet 0  . dextrose 50 % solution Inject 25 mLs (12.5 g total) into the vein as needed for low blood sugar. (Patient not taking: Reported on 02/02/2019) 50 mL   . doxycycline (VIBRA-TABS) 100 MG tablet Take 1 tablet (100 mg total) by mouth 2 (two) times daily. (Patient not taking: Reported on 02/02/2019) 14 tablet 0  . polyethylene glycol (MIRALAX) packet Take 17 g by mouth daily as needed for mild constipation. (Patient not taking: Reported on 02/02/2019) 14 each 0  . senna-docusate (SENOKOT-S) 8.6-50 MG tablet Take 2 tablets by mouth at  bedtime as needed for mild constipation. (Patient not taking: Reported on 02/02/2019)        Allergies  Allergen Reactions  . Bee Venom Swelling    Swells up with bee stings   . Metformin And Related Other (See Comments)    Upset stomach    REVIEW OF SYSTEMS: Skin:  No history of rash.  No history of abnormal moles. Infection:  She has had infections of her feet and left hand, which have prompted the amputations. Neurologic:  No history of stroke.  No history of seizure.  No history of headaches. Cardiac:  No history of hypertension. No history of heart disease.  No history of prior cardiac catheterization.  No history of seeing a cardiologist. Pulmonary:  Does not smoke cigarettes.  No asthma or bronchitis.  No OSA/CPAP.  Endocrine:  Uncontrolled DM Gastrointestinal:  See HPI Urologic:  No history of kidney stones.  No history of bladder infections. Musculoskeletal:  Left transtibial amputation - 05/10/2018 - Sharol Given, Right transtibial amputation - 11/11/2017 - Sharol Given Hematologic:  No bleeding disorder.  No history of anemia.  Not anticoagulated. Psycho-social:   Unable to assess  SOCIAL and FAMILY HISTORY: Lives in a apartment. Has a sitter who come and helps her  daily.  She has 2 sons, Shams Urena and Konrad Felix (DQ) and one daughter, Mariaines Sawhill [phone])  We got on the phone with her cousin, Christena Kershaw [id number]) and her sister, Brooke Dare..  PHYSICAL EXAM: BP 95/64   Pulse 81   Temp (!) 95.9 F (35.5 C)   Resp 20   Wt 57.9 kg   LMP 03/29/2015   SpO2 94%   BMI 23.35 kg/m   General: Older confused AA who is responds. Skin:  Inspection and palpation - no mass or rash. Eyes:  Conjunctiva and lids unremarkable.            Pupils are equal Ears, Nose, Mouth, and Throat:  Ears and nose unremarkable  Neck: Supple. No mass, trachea midline.  No thyroid mass. Lymph Nodes:  No supraclavicular, cervical, or inguinal nodes. Lungs: Normal respiratory effort.  Clear to auscultation and symmetric breath sounds. Heart:  Palpation of the heart is normal.            Auscultation: Tachycardia. No murmur or rub.  Abdomen:  No mass.  No hernia. No bowel sounds.  Distended, but not as tender as I would have thought. Rectal: Not done. Musculoskeletal:  Bilateral BKA.  Left middle finger amputation.  Decubiti left hip.  Neurologic:  Grossly intact to motor and sensory function. Psychiatric: Confused and cannot give a history.   DATA REVIEWED, COUNSELING AND COORDINATION OF CARE: Epic notes reviewed. Counseling and coordination of care exceeded more than 50% of the time spent with patient. Total time spent with patient and charting: 60 minutes  Alphonsa Overall, MD,  [facility], [address]  [place], [place]    [place] Phone:  [phone] FAX:  [phone]

## 2019-02-03 ENCOUNTER — Inpatient Hospital Stay (HOSPITAL_COMMUNITY): Payer: Medicare HMO | Admitting: Certified Registered Nurse Anesthetist

## 2019-02-03 ENCOUNTER — Encounter (HOSPITAL_COMMUNITY): Payer: Self-pay | Admitting: Surgery

## 2019-02-03 ENCOUNTER — Inpatient Hospital Stay (HOSPITAL_COMMUNITY): Payer: Medicare HMO

## 2019-02-03 DIAGNOSIS — J9600 Acute respiratory failure, unspecified whether with hypoxia or hypercapnia: Secondary | ICD-10-CM | POA: Diagnosis not present

## 2019-02-03 DIAGNOSIS — N179 Acute kidney failure, unspecified: Secondary | ICD-10-CM | POA: Diagnosis not present

## 2019-02-03 DIAGNOSIS — R6521 Severe sepsis with septic shock: Secondary | ICD-10-CM

## 2019-02-03 DIAGNOSIS — A419 Sepsis, unspecified organism: Secondary | ICD-10-CM

## 2019-02-03 LAB — GLUCOSE, CAPILLARY
Glucose-Capillary: 116 mg/dL — ABNORMAL HIGH (ref 70–99)
Glucose-Capillary: 131 mg/dL — ABNORMAL HIGH (ref 70–99)
Glucose-Capillary: 168 mg/dL — ABNORMAL HIGH (ref 70–99)
Glucose-Capillary: 244 mg/dL — ABNORMAL HIGH (ref 70–99)
Glucose-Capillary: 253 mg/dL — ABNORMAL HIGH (ref 70–99)
Glucose-Capillary: 342 mg/dL — ABNORMAL HIGH (ref 70–99)
Glucose-Capillary: 349 mg/dL — ABNORMAL HIGH (ref 70–99)
Glucose-Capillary: 406 mg/dL — ABNORMAL HIGH (ref 70–99)
Glucose-Capillary: 451 mg/dL — ABNORMAL HIGH (ref 70–99)
Glucose-Capillary: 469 mg/dL — ABNORMAL HIGH (ref 70–99)
Glucose-Capillary: 502 mg/dL (ref 70–99)
Glucose-Capillary: 508 mg/dL (ref 70–99)
Glucose-Capillary: 511 mg/dL (ref 70–99)
Glucose-Capillary: 535 mg/dL (ref 70–99)
Glucose-Capillary: 55 mg/dL — ABNORMAL LOW (ref 70–99)
Glucose-Capillary: 559 mg/dL (ref 70–99)
Glucose-Capillary: 56 mg/dL — ABNORMAL LOW (ref 70–99)
Glucose-Capillary: 576 mg/dL (ref 70–99)
Glucose-Capillary: 579 mg/dL (ref 70–99)
Glucose-Capillary: 58 mg/dL — ABNORMAL LOW (ref 70–99)
Glucose-Capillary: 63 mg/dL — ABNORMAL LOW (ref 70–99)
Glucose-Capillary: 70 mg/dL (ref 70–99)
Glucose-Capillary: 77 mg/dL (ref 70–99)
Glucose-Capillary: 77 mg/dL (ref 70–99)
Glucose-Capillary: 86 mg/dL (ref 70–99)
Glucose-Capillary: >600 mg/dL (ref 70–99)
Glucose-Capillary: >600 mg/dL (ref 70–99)
Glucose-Capillary: >600 mg/dL (ref 70–99)
Glucose-Capillary: >600 mg/dL (ref 70–99)

## 2019-02-03 LAB — BASIC METABOLIC PANEL
Anion gap: 11 (ref 5–15)
Anion gap: 10 (ref 5–15)
Anion gap: 11 (ref 5–15)
Anion gap: 10 (ref 5–15)
Anion gap: 12 (ref 5–15)
BUN: 36 mg/dL — ABNORMAL HIGH (ref 6–20)
BUN: 35 mg/dL — ABNORMAL HIGH (ref 6–20)
BUN: 39 mg/dL — ABNORMAL HIGH (ref 6–20)
BUN: 38 mg/dL — ABNORMAL HIGH (ref 6–20)
BUN: 39 mg/dL — ABNORMAL HIGH (ref 6–20)
CO2: 11 mmol/L — ABNORMAL LOW (ref 22–32)
CO2: 14 mmol/L — ABNORMAL LOW (ref 22–32)
CO2: 11 mmol/L — ABNORMAL LOW (ref 22–32)
CO2: 13 mmol/L — ABNORMAL LOW (ref 22–32)
CO2: 14 mmol/L — ABNORMAL LOW (ref 22–32)
Calcium: 8.2 mg/dL — ABNORMAL LOW (ref 8.9–10.3)
Calcium: 8.1 mg/dL — ABNORMAL LOW (ref 8.9–10.3)
Calcium: 8.1 mg/dL — ABNORMAL LOW (ref 8.9–10.3)
Calcium: 8.1 mg/dL — ABNORMAL LOW (ref 8.9–10.3)
Calcium: 7.7 mg/dL — ABNORMAL LOW (ref 8.9–10.3)
Chloride: 106 mmol/L (ref 98–111)
Chloride: 105 mmol/L (ref 98–111)
Chloride: 111 mmol/L (ref 98–111)
Chloride: 112 mmol/L — ABNORMAL HIGH (ref 98–111)
Chloride: 109 mmol/L (ref 98–111)
Creatinine, Ser: 1.61 mg/dL — ABNORMAL HIGH (ref 0.44–1.00)
Creatinine, Ser: 1.63 mg/dL — ABNORMAL HIGH (ref 0.44–1.00)
Creatinine, Ser: 1.96 mg/dL — ABNORMAL HIGH (ref 0.44–1.00)
Creatinine, Ser: 1.95 mg/dL — ABNORMAL HIGH (ref 0.44–1.00)
Creatinine, Ser: 2.06 mg/dL — ABNORMAL HIGH (ref 0.44–1.00)
GFR calc Af Amer: 41 mL/min — ABNORMAL LOW (ref >60)
GFR calc Af Amer: 40 mL/min — ABNORMAL LOW (ref >60)
GFR calc Af Amer: 32 mL/min — ABNORMAL LOW (ref >60)
GFR calc Af Amer: 32 mL/min — ABNORMAL LOW (ref >60)
GFR calc Af Amer: 30 mL/min — ABNORMAL LOW (ref >60)
GFR calc non Af Amer: 35 mL/min — ABNORMAL LOW (ref >60)
GFR calc non Af Amer: 35 mL/min — ABNORMAL LOW (ref >60)
GFR calc non Af Amer: 28 mL/min — ABNORMAL LOW (ref >60)
GFR calc non Af Amer: 28 mL/min — ABNORMAL LOW (ref >60)
GFR calc non Af Amer: 26 mL/min — ABNORMAL LOW (ref >60)
Glucose, Bld: 698 mg/dL (ref 70–99)
Glucose, Bld: 626 mg/dL (ref 70–99)
Glucose, Bld: 335 mg/dL — ABNORMAL HIGH (ref 70–99)
Glucose, Bld: 98 mg/dL (ref 70–99)
Glucose, Bld: 117 mg/dL — ABNORMAL HIGH (ref 70–99)
Potassium: 4.6 mmol/L (ref 3.5–5.1)
Potassium: 4.9 mmol/L (ref 3.5–5.1)
Potassium: 4.7 mmol/L (ref 3.5–5.1)
Potassium: 4.7 mmol/L (ref 3.5–5.1)
Potassium: 5.5 mmol/L — ABNORMAL HIGH (ref 3.5–5.1)
Sodium: 128 mmol/L — ABNORMAL LOW (ref 135–145)
Sodium: 129 mmol/L — ABNORMAL LOW (ref 135–145)
Sodium: 133 mmol/L — ABNORMAL LOW (ref 135–145)
Sodium: 135 mmol/L (ref 135–145)
Sodium: 135 mmol/L (ref 135–145)

## 2019-02-03 LAB — CBC
HCT: 37.5 % (ref 36.0–46.0)
HCT: 39.3 % (ref 36.0–46.0)
Hemoglobin: 11.3 g/dL — ABNORMAL LOW (ref 12.0–15.0)
Hemoglobin: 12.7 g/dL (ref 12.0–15.0)
MCH: 29.4 pg (ref 26.0–34.0)
MCH: 30 pg (ref 26.0–34.0)
MCHC: 30.1 g/dL (ref 30.0–36.0)
MCHC: 32.3 g/dL (ref 30.0–36.0)
MCV: 97.4 fL (ref 80.0–100.0)
MCV: 92.9 fL (ref 80.0–100.0)
Platelets: 334 10*3/uL (ref 150–400)
Platelets: UNDETERMINED 10*3/uL (ref 150–400)
RBC: 3.85 MIL/uL — ABNORMAL LOW (ref 3.87–5.11)
RBC: 4.23 MIL/uL (ref 3.87–5.11)
RDW: 12.8 % (ref 11.5–15.5)
RDW: 12.6 % (ref 11.5–15.5)
WBC: 1.7 10*3/uL — ABNORMAL LOW (ref 4.0–10.5)
WBC: 6.4 10*3/uL (ref 4.0–10.5)
nRBC: 0 % (ref 0.0–0.2)
nRBC: 0.5 % — ABNORMAL HIGH (ref 0.0–0.2)

## 2019-02-03 LAB — PHOSPHORUS
Phosphorus: 5.8 mg/dL — ABNORMAL HIGH (ref 2.5–4.6)
Phosphorus: 5.3 mg/dL — ABNORMAL HIGH (ref 2.5–4.6)

## 2019-02-03 LAB — BLOOD GAS, ARTERIAL
Acid-base deficit: 17.9 mmol/L — ABNORMAL HIGH (ref 0.0–2.0)
Acid-base deficit: 16.4 mmol/L — ABNORMAL HIGH (ref 0.0–2.0)
Acid-base deficit: 17.4 mmol/L — ABNORMAL HIGH (ref 0.0–2.0)
Bicarbonate: 11.9 mmol/L — ABNORMAL LOW (ref 20.0–28.0)
Bicarbonate: 11.3 mmol/L — ABNORMAL LOW (ref 20.0–28.0)
Bicarbonate: 9.8 mmol/L — ABNORMAL LOW (ref 20.0–28.0)
Drawn by: 295031
FIO2: 40
FIO2: 40
FIO2: 30
MECHVT: 400 mL
O2 Saturation: 96.2 %
O2 Saturation: 98.4 %
O2 Saturation: 98.9 %
Patient temperature: 93.9
Patient temperature: 98.6
Patient temperature: 98.6
RATE: 25 resp/min
pCO2 arterial: 37.7 mmHg (ref 32.0–48.0)
pCO2 arterial: 33.6 mmHg (ref 32.0–48.0)
pCO2 arterial: 27.7 mmHg — ABNORMAL LOW (ref 32.0–48.0)
pH, Arterial: 7.104 — CL (ref 7.350–7.450)
pH, Arterial: 7.152 — CL (ref 7.350–7.450)
pH, Arterial: 7.175 — CL (ref 7.350–7.450)
pO2, Arterial: 95.7 mmHg (ref 83.0–108.0)
pO2, Arterial: 139 mmHg — ABNORMAL HIGH (ref 83.0–108.0)
pO2, Arterial: 153 mmHg — ABNORMAL HIGH (ref 83.0–108.0)

## 2019-02-03 LAB — BLOOD GAS, VENOUS
O2 Saturation: 70.3 %
Patient temperature: 98.6
pCO2, Ven: 35.8 mmHg — ABNORMAL LOW (ref 44.0–60.0)
pH, Ven: <6.8 — CL (ref 7.250–7.430)
pO2, Ven: 71.7 mmHg — ABNORMAL HIGH (ref 32.0–45.0)

## 2019-02-03 LAB — MAGNESIUM
Magnesium: 1.7 mg/dL (ref 1.7–2.4)
Magnesium: 1.8 mg/dL (ref 1.7–2.4)

## 2019-02-03 LAB — POCT I-STAT, CHEM 8
BUN: 28 mg/dL — ABNORMAL HIGH (ref 6–20)
Calcium, Ion: 1.26 mmol/L (ref 1.15–1.40)
Chloride: 103 mmol/L (ref 98–111)
Creatinine, Ser: 1.2 mg/dL — ABNORMAL HIGH (ref 0.44–1.00)
Glucose, Bld: >700 mg/dL (ref 70–99)
HCT: 26 % — ABNORMAL LOW (ref 36.0–46.0)
Hemoglobin: 8.8 g/dL — ABNORMAL LOW (ref 12.0–15.0)
Potassium: 4.2 mmol/L (ref 3.5–5.1)
Sodium: 131 mmol/L — ABNORMAL LOW (ref 135–145)
TCO2: 13 mmol/L — ABNORMAL LOW (ref 22–32)

## 2019-02-03 LAB — OSMOLALITY: Osmolality: 338 mOsm/kg (ref 275–295)

## 2019-02-03 LAB — MRSA PCR SCREENING: MRSA by PCR: NEGATIVE

## 2019-02-03 LAB — SARS CORONAVIRUS 2 BY RT PCR (HOSPITAL ORDER, PERFORMED IN ~~LOC~~ HOSPITAL LAB): SARS Coronavirus 2: NEGATIVE

## 2019-02-03 LAB — PREALBUMIN: Prealbumin: 6.2 mg/dL — ABNORMAL LOW (ref 18–38)

## 2019-02-03 LAB — BETA-HYDROXYBUTYRIC ACID: Beta-Hydroxybutyric Acid: 0.11 mmol/L (ref 0.05–0.27)

## 2019-02-03 MED ORDER — HEPARIN SODIUM (PORCINE) 5000 UNIT/ML IJ SOLN: 5000.0000 [IU] | Freq: Three times a day (TID) | INTRAMUSCULAR | Status: DC

## 2019-02-03 MED ORDER — HEPARIN SODIUM (PORCINE) 5000 UNIT/ML IJ SOLN
5000.0000 [IU] | Freq: Three times a day (TID) | INTRAMUSCULAR | Status: DC
  Administered 2019-02-03 – 2019-02-04 (×3): 5000 [IU] via SUBCUTANEOUS
  Filled 2019-02-03 (×3): qty 1

## 2019-02-03 MED ORDER — SODIUM CHLORIDE 0.9% FLUSH
10.0000 mL | Freq: Two times a day (BID) | INTRAVENOUS | Status: DC
  Administered 2019-02-03 – 2019-02-04 (×2): 10 mL
  Administered 2019-02-04: 40 mL
  Administered 2019-02-05: 10 mL
  Administered 2019-02-05: 40 mL
  Administered 2019-02-06: 10 mL
  Administered 2019-02-06: 20 mL
  Administered 2019-02-07 – 2019-02-10 (×7): 10 mL
  Administered 2019-02-10: 40 mL
  Administered 2019-02-11 – 2019-02-12 (×4): 10 mL

## 2019-02-03 MED ORDER — ROCURONIUM BROMIDE 10 MG/ML (PF) SYRINGE
PREFILLED_SYRINGE | INTRAVENOUS | Status: DC | PRN
  Administered 2019-02-03: 100 mg via INTRAVENOUS

## 2019-02-03 MED ORDER — NOREPINEPHRINE 16 MG/250ML-% IV SOLN
0.0000 ug/min | INTRAVENOUS | Status: DC
  Administered 2019-02-03 – 2019-02-04 (×2): 40 ug/min via INTRAVENOUS
  Filled 2019-02-03 (×7): qty 250

## 2019-02-03 MED ORDER — PROPOFOL 10 MG/ML IV BOLUS
INTRAVENOUS | Status: DC | PRN
  Administered 2019-02-03: 120 mg via INTRAVENOUS

## 2019-02-03 MED ORDER — EPHEDRINE SULFATE-NACL 50-0.9 MG/10ML-% IV SOSY
PREFILLED_SYRINGE | INTRAVENOUS | Status: DC | PRN
  Administered 2019-02-03: 15 mg via INTRAVENOUS
  Administered 2019-02-03: 10 mg via INTRAVENOUS

## 2019-02-03 MED ORDER — PHENYLEPHRINE 40 MCG/ML (10ML) SYRINGE FOR IV PUSH (FOR BLOOD PRESSURE SUPPORT)
PREFILLED_SYRINGE | INTRAVENOUS | Status: AC
  Filled 2019-02-03: qty 50

## 2019-02-03 MED ORDER — PHENYLEPHRINE 40 MCG/ML (10ML) SYRINGE FOR IV PUSH (FOR BLOOD PRESSURE SUPPORT)
PREFILLED_SYRINGE | INTRAVENOUS | Status: DC | PRN
  Administered 2019-02-03 (×2): 120 ug via INTRAVENOUS
  Administered 2019-02-03: 80 ug via INTRAVENOUS
  Administered 2019-02-03: 240 ug via INTRAVENOUS
  Administered 2019-02-03: 120 ug via INTRAVENOUS
  Administered 2019-02-03: 160 ug via INTRAVENOUS

## 2019-02-03 MED ORDER — FENTANYL CITRATE (PF) 100 MCG/2ML IJ SOLN
INTRAMUSCULAR | Status: DC | PRN
  Administered 2019-02-03 (×2): 50 ug via INTRAVENOUS

## 2019-02-03 MED ORDER — LIDOCAINE 2% (20 MG/ML) 5 ML SYRINGE
INTRAMUSCULAR | Status: DC | PRN
  Administered 2019-02-03: 60 mg via INTRAVENOUS

## 2019-02-03 MED ORDER — ONDANSETRON HCL 4 MG/2ML IJ SOLN
4.0000 mg | Freq: Four times a day (QID) | INTRAMUSCULAR | Status: DC | PRN
  Administered 2019-02-16 – 2019-03-05 (×3): 4 mg via INTRAVENOUS
  Filled 2019-02-03 (×3): qty 2

## 2019-02-03 MED ORDER — VANCOMYCIN HCL IN DEXTROSE 750-5 MG/150ML-% IV SOLN: 750.0000 mg | INTRAVENOUS | Status: DC

## 2019-02-03 MED ORDER — SODIUM CHLORIDE 0.9% FLUSH
10.0000 mL | INTRAVENOUS | Status: DC | PRN
  Administered 2019-02-10: 10 mL
  Filled 2019-02-03: qty 40

## 2019-02-03 MED ORDER — CHLORHEXIDINE GLUCONATE 0.12% ORAL RINSE (MEDLINE KIT)
15.0000 mL | Freq: Two times a day (BID) | OROMUCOSAL | Status: DC
  Administered 2019-02-03 – 2019-03-10 (×68): 15 mL via OROMUCOSAL

## 2019-02-03 MED ORDER — EPHEDRINE 5 MG/ML INJ
INTRAVENOUS | Status: AC
  Filled 2019-02-03: qty 30

## 2019-02-03 MED ORDER — FENTANYL 2500MCG IN NS 250ML (10MCG/ML) PREMIX INFUSION
50.0000 ug/h | INTRAVENOUS | Status: DC
  Administered 2019-02-03 – 2019-02-04 (×2): 50 ug/h via INTRAVENOUS
  Filled 2019-02-03: qty 250

## 2019-02-03 MED ORDER — STERILE WATER FOR INJECTION IV SOLN
INTRAVENOUS | Status: DC
  Administered 2019-02-03 – 2019-02-05 (×7): via INTRAVENOUS
  Filled 2019-02-03 (×12): qty 850

## 2019-02-03 MED ORDER — ACETAMINOPHEN 650 MG RE SUPP
650.0000 mg | RECTAL | Status: DC | PRN
  Administered 2019-02-03 – 2019-02-04 (×4): 650 mg via RECTAL
  Filled 2019-02-03 (×4): qty 1

## 2019-02-03 MED ORDER — VASOPRESSIN 20 UNIT/ML IV SOLN
0.0300 [IU]/min | INTRAVENOUS | Status: DC
  Administered 2019-02-03: 0.03 [IU]/min via INTRAVENOUS
  Administered 2019-02-03: 0.02 [IU]/min via INTRAVENOUS
  Administered 2019-02-04 – 2019-02-05 (×2): 0.03 [IU]/min via INTRAVENOUS
  Filled 2019-02-03 (×5): qty 2

## 2019-02-03 MED ORDER — ORAL CARE MOUTH RINSE
15.0000 mL | OROMUCOSAL | Status: DC
  Administered 2019-02-03 – 2019-02-09 (×63): 15 mL via OROMUCOSAL

## 2019-02-03 MED ORDER — ACETAMINOPHEN 325 MG PO TABS: 650.0000 mg | ORAL_TABLET | ORAL | Status: DC | PRN

## 2019-02-03 MED ORDER — PROPOFOL 500 MG/50ML IV EMUL
INTRAVENOUS | Status: DC | PRN
  Administered 2019-02-03: 35 ug/kg/min via INTRAVENOUS

## 2019-02-03 MED ORDER — NOREPINEPHRINE 4 MG/250ML-% IV SOLN
0.0000 ug/min | INTRAVENOUS | Status: DC
  Administered 2019-02-03: 40 ug/min via INTRAVENOUS
  Administered 2019-02-03: 21 ug/min via INTRAVENOUS
  Administered 2019-02-03: 40 ug/min via INTRAVENOUS
  Administered 2019-02-03: 15 ug/min via INTRAVENOUS
  Filled 2019-02-03 (×4): qty 250

## 2019-02-03 MED ORDER — SODIUM CHLORIDE 0.9 % IR SOLN
Status: DC | PRN
  Administered 2019-02-03 (×8): 1000 mL

## 2019-02-03 MED ORDER — SODIUM CHLORIDE 0.9 % IV SOLN
250.0000 mL | INTRAVENOUS | Status: DC
  Administered 2019-02-13: 250 mL via INTRAVENOUS

## 2019-02-03 MED ORDER — PIPERACILLIN-TAZOBACTAM 3.375 G IVPB
3.3750 g | Freq: Three times a day (TID) | INTRAVENOUS | Status: AC
  Administered 2019-02-03 – 2019-02-16 (×41): 3.375 g via INTRAVENOUS
  Filled 2019-02-03 (×41): qty 50

## 2019-02-03 MED ORDER — MICONAZOLE NITRATE POWD
Freq: Three times a day (TID) | Status: DC
  Administered 2019-02-03 – 2019-02-09 (×17): via TOPICAL
  Administered 2019-02-09: 1 via TOPICAL
  Administered 2019-02-09: 15:00:00 via TOPICAL

## 2019-02-03 MED ORDER — POTASSIUM CHLORIDE 10 MEQ/100ML IV SOLN
10.0000 meq | INTRAVENOUS | Status: AC
  Administered 2019-02-03 (×3): 10 meq via INTRAVENOUS
  Filled 2019-02-03: qty 100

## 2019-02-03 MED ORDER — CHLORHEXIDINE GLUCONATE CLOTH 2 % EX PADS
6.0000 | MEDICATED_PAD | Freq: Every day | CUTANEOUS | Status: DC
  Administered 2019-02-03 – 2019-03-10 (×35): 6 via TOPICAL

## 2019-02-03 MED ORDER — ALBUMIN HUMAN 5 % IV SOLN
12.5000 g | Freq: Once | INTRAVENOUS | Status: AC
  Administered 2019-02-03: 12.5 g via INTRAVENOUS
  Filled 2019-02-03: qty 250

## 2019-02-03 MED ORDER — PANTOPRAZOLE SODIUM 40 MG IV SOLR
40.0000 mg | Freq: Two times a day (BID) | INTRAVENOUS | Status: DC
  Administered 2019-02-03 – 2019-03-10 (×71): 40 mg via INTRAVENOUS
  Filled 2019-02-03 (×71): qty 40

## 2019-02-03 MED ORDER — INSULIN ASPART 100 UNIT/ML ~~LOC~~ SOLN
SUBCUTANEOUS | Status: DC | PRN
  Administered 2019-02-03 (×2): 10 [IU] via INTRAVENOUS

## 2019-02-03 MED ORDER — FENTANYL CITRATE (PF) 100 MCG/2ML IJ SOLN: 25.0000 ug | Freq: Once | INTRAMUSCULAR | Status: AC

## 2019-02-03 MED ORDER — FENTANYL BOLUS VIA INFUSION
25.0000 ug | INTRAVENOUS | Status: DC | PRN
  Administered 2019-02-03 – 2019-02-05 (×3): 25 ug via INTRAVENOUS
  Filled 2019-02-03: qty 25

## 2019-02-03 MED ORDER — ALBUTEROL SULFATE (2.5 MG/3ML) 0.083% IN NEBU: 2.5000 mg | INHALATION_SOLUTION | RESPIRATORY_TRACT | Status: DC | PRN

## 2019-02-03 NOTE — Anesthesia Procedure Notes (Addendum)
Central Venous Catheter Insertion Performed by: Pervis Hocking, DO, anesthesiologist Start/End11/23/2020 2:01 AM, 02/03/2019 2:01 AM Patient location: OR. Preanesthetic checklist: patient identified, IV checked, site marked, risks and benefits discussed, surgical consent, monitors and equipment checked, pre-op evaluation, timeout performed and anesthesia consent Position: Trendelenburg Lidocaine 1% used for infiltration Hand hygiene performed , maximum sterile barriers used  and Seldinger technique used Total catheter length 16. Central line was placed.Triple lumen Procedure performed using ultrasound guided technique. Ultrasound Notes:anatomy identified, needle tip was noted to be adjacent to the nerve/plexus identified, no ultrasound evidence of intravascular and/or intraneural injection and image(s) printed for medical record Attempts: 1 Following insertion, dressing applied and line sutured. Post procedure assessment: blood return through all ports  Patient tolerated the procedure well with no immediate complications. Additional procedure comments: 14Fr triple lumen catheter.

## 2019-02-03 NOTE — ED Notes (Signed)
Patient placed on 4L [place] due to O2 Sat- 83%. sharwtz md notified

## 2019-02-03 NOTE — Progress Notes (Signed)
[facility] Progress Note  1 Day Post-Op  Subjective: CC-  Hypotensive and tachycardic. On vasopressin and norepinephrine.  Objective: Vital signs in last 24 hours: Temp:  [90.1 F (32.3 C)-97.3 F (36.3 C)] 97.2 F (36.2 C) (11/23 0600) Pulse Rate:  [58-90] 85 (11/22 2330) Resp:  [0-35] 17 (11/23 0600) BP: (66-142)/(43-71) 104/57 (11/23 0600) SpO2:  [91 %-100 %] 95 % (11/22 2330) Arterial Line BP: (58-84)/(55-68) 58/55 (11/23 0518) FiO2 (%):  [40 %] 40 % (11/23 0420) Weight:  [57.9 kg-62 kg] 62 kg (11/23 0500)    Intake/Output from previous day: 11/22 0701 - 11/23 0700 In: 8979.4 [I.V.:2282.3; IV Piggyback:6697.1] Out: 555 [Urine:275; Drains:180; Blood:100] Intake/Output this shift: No intake/output data recorded.  PE: Gen:  Sedated on the vent HEENT: ETT in place Card: tachycardic Pulm:  CTAB, no W/R/R, mechanically ventilated Abd: Soft, mild distension, hypoactive BS, open midline incision pink, JP drain with murky drainage in bulb Ext:  S/p bilateral BKA Neuro: not following commands  Lab Results:  Recent Labs    02/02/19 1944  02/03/19 0141 02/03/19 0548  WBC 4.0  --   --  1.7*  HGB 9.3*   < > 8.8* 11.3*  HCT 32.0*   < > 26.0* 37.5  PLT 199  --   --  334   < > = values in this interval not displayed.   BMET Recent Labs    02/03/19 0148 02/03/19 0548  NA 128* 129*  K 4.6 4.9  CL 106 105  CO2 11* 14*  GLUCOSE 698* 626*  BUN 36* 35*  CREATININE 1.61* 1.63*  CALCIUM 8.2* 8.1*   PT/INR Recent Labs    02/02/19 1832 02/02/19 1952  LABPROT 16.0* 15.2  INR 1.3* 1.2   CMP     Component Value Date/Time   NA 129 (L) 02/03/2019 0548   NA 139 10/04/2017 1705   K 4.9 02/03/2019 0548   CL 105 02/03/2019 0548   CO2 14 (L) 02/03/2019 0548   GLUCOSE 626 (HH) 02/03/2019 0548   BUN 35 (H) 02/03/2019 0548   BUN 11 10/04/2017 1705   CREATININE 1.63 (H) 02/03/2019 0548   CREATININE 0.68 04/28/2016 1640   CALCIUM 8.1 (L) 02/03/2019 0548    PROT 6.3 (L) 02/02/2019 1832   PROT 7.1 10/04/2017 1705   ALBUMIN 2.4 (L) 02/02/2019 1832   ALBUMIN 3.7 10/04/2017 1705   AST 30 02/02/2019 1832   ALT 22 02/02/2019 1832   ALKPHOS 71 02/02/2019 1832   BILITOT 0.8 02/02/2019 1832   BILITOT <0.2 10/04/2017 1705   GFRNONAA 35 (L) 02/03/2019 0548   GFRNONAA >89 04/28/2016 1640   GFRAA 40 (L) 02/03/2019 0548   GFRAA >89 04/28/2016 1640   Lipase     Component Value Date/Time   LIPASE 27 04/01/2016 1040       Studies/Results: Ct Abdomen Pelvis Wo Contrast  Result Date: 02/02/2019 CLINICAL DATA:  Abdominal pain. EXAM: CT ABDOMEN AND PELVIS WITHOUT CONTRAST TECHNIQUE: Multidetector CT imaging of the abdomen and pelvis was performed following the standard protocol without IV contrast. COMPARISON:  06/15/2013 FINDINGS: Lower chest: There is mild airspace disease at the lung bases bilaterally.The heart size is normal. The intracardiac blood pool is hypodense relative to the adjacent myocardium consistent with anemia. Hepatobiliary: The liver is normal. Cholelithiasis without acute inflammation.There is no biliary ductal dilation. Pancreas: Normal contours without ductal dilatation. No peripancreatic fluid collection. Spleen: No splenic laceration or hematoma. Adrenals/Urinary Tract: --Adrenal glands: No adrenal hemorrhage. --Right kidney/ureter:  No hydronephrosis or perinephric hematoma. --Left kidney/ureter: No hydronephrosis or perinephric hematoma. --Urinary bladder: The bladder is decompressed with a Foley catheter which limits evaluation. Stomach/Bowel: --Stomach/Duodenum: There is some wall thickening of the distal esophagus. The stomach is mildly distended. --Small bowel: There is pneumatosis involving multiple small bowel loops. The small bowel is mildly dilated without evidence for a high-grade obstruction. --Colon: No focal abnormality. --Appendix: Normal. Vascular/Lymphatic: There are minimal atherosclerotic changes of the abdominal  aorta. Gas is noted within the SMV and draining mesenteric veins. --there are some prominent retroperitoneal lymph nodes that are suboptimally evaluated in the absence of IV contrast. --No mesenteric lymphadenopathy. --No pelvic or inguinal lymphadenopathy. Reproductive: Unremarkable Other: There is a large amount of free air within the abdomen. There is a moderate volume of somewhat complex free fluid in the abdomen. There is mild body wall edema. Musculoskeletal. No acute displaced fractures. IMPRESSION: 1. Findings consistent with a hollow viscus perforation as evidence by a large volume of free air and a moderate volume a complex free fluid throughout the abdomen. 2. 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 Gas is noted within the SMV and draining mesenteric veins. Findings are concerning for mesenteric ischemia 3. There is cholelithiasis without secondary signs of acute cholecystitis. 4. Bibasilar airspace disease may represent atelectasis, pneumonia, or aspiration in the appropriate clinical setting. These results were called by telephone at the time of interpretation on 02/02/2019 at 9:25 pm to provider Sherwood Gambler , who verbally acknowledged these results. Electronically Signed   By: Constance Holster M.D.   On: 02/02/2019 21:42   Ct Head Wo Contrast  Result Date: 02/02/2019 CLINICAL DATA:  Found on the floor at home.  Nausea and vomiting. EXAM: CT HEAD WITHOUT CONTRAST TECHNIQUE: Contiguous axial images were obtained from the base of the skull through the vertex without intravenous contrast. COMPARISON:  03/13/2018 FINDINGS: Brain: No evidence of acute infarction, hemorrhage, hydrocephalus, extra-axial collection or mass lesion/mass effect. Vascular: No hyperdense vessel or unexpected calcification. Skull: Normal. Negative for fracture or focal lesion. Sinuses/Orbits: Globes and orbits are unremarkable. Visualized sinuses and mastoid air cells are clear. Other: None.  IMPRESSION: Normal enhanced CT scan of the brain. Electronically Signed   By: Lajean Manes M.D.   On: 02/02/2019 21:38   Dg Chest Port 1 View  Result Date: 02/03/2019 CLINICAL DATA:  ET and NG placement EXAM: PORTABLE CHEST 1 VIEW COMPARISON:  Radiograph 02/02/2019 FINDINGS: Endotracheal tube terminates in the mid trachea, 4.5 cm from the carina. Transesophageal tube tip and side port are distal to the GE junction curling in the left upper quadrant. Right IJ approach catheter sheath tip terminates at the superior cavoatrial junction. Lung volumes are low with some streaky atelectatic changes. Cardiomediastinal contours are unremarkable for portable technique. No acute osseous or soft tissue abnormality. IMPRESSION: 1. Endotracheal tube terminates in the mid trachea, 4.5 cm from the carina. 2. Transesophageal tube tip and side port are distal to the GE junction. 3. Right IJ catheter terminates at the superior cavoatrial junction. 4. Basilar atelectasis otherwise no acute cardiopulmonary abnormality. Electronically Signed   By: Lovena Le M.D.   On: 02/03/2019 03:35   Dg Chest Port 1 View  Result Date: 02/02/2019 CLINICAL DATA:  Patient arrived by EMS from home. Pt was found on the floor at home. Pt has N/V. Patient is urinary and fecal incontinent. Hx of DM and HTN. EXAM: PORTABLE CHEST 1 VIEW COMPARISON:  04/01/2016 FINDINGS: Cardiac silhouette is normal in size. No  mediastinal or hilar masses. Lung volumes are low. Lungs are clear. No pleural effusion or pneumothorax. Skeletal structures are grossly intact. IMPRESSION: No active disease. Electronically Signed   By: Lajean Manes M.D.   On: 02/02/2019 19:56    Anti-infectives: Anti-infectives (From admission, onward)   Start     Dose/Rate Route Frequency Ordered Stop   02/04/19 0200  fluconazole (DIFLUCAN) IVPB 200 mg     200 mg 100 mL/hr over 60 Minutes Intravenous Every 24 hours 02/02/19 2302     02/03/19 1800  vancomycin (VANCOCIN) IVPB 750  mg/150 ml premix  Status:  Discontinued     750 mg 150 mL/hr over 60 Minutes Intravenous Every 24 hours 02/03/19 0241 02/03/19 0242   02/03/19 1800  vancomycin (VANCOCIN) IVPB 750 mg/150 ml premix     750 mg 150 mL/hr over 60 Minutes Intravenous Every 24 hours 02/03/19 0242     02/03/19 0600  piperacillin-tazobactam (ZOSYN) IVPB 3.375 g     3.375 g 12.5 mL/hr over 240 Minutes Intravenous Every 8 hours 02/03/19 0243     02/02/19 2315  fluconazole (DIFLUCAN) IVPB 400 mg     400 mg 100 mL/hr over 120 Minutes Intravenous STAT 02/02/19 2301 02/03/19 0218   02/02/19 1800  ceFEPIme (MAXIPIME) 2 g in sodium chloride 0.9 % 100 mL IVPB     2 g 200 mL/hr over 30 Minutes Intravenous  Once 02/02/19 1745 02/02/19 2030   02/02/19 1800  metroNIDAZOLE (FLAGYL) IVPB 500 mg     500 mg 100 mL/hr over 60 Minutes Intravenous  Once 02/02/19 1745 02/02/19 2237   02/02/19 1800  vancomycin (VANCOCIN) IVPB 1000 mg/200 mL premix  Status:  Discontinued     1,000 mg 200 mL/hr over 60 Minutes Intravenous  Once 02/02/19 1745 02/02/19 1747   02/02/19 1800  vancomycin (VANCOCIN) 1,250 mg in sodium chloride 0.9 % 250 mL IVPB     1,250 mg 166.7 mL/hr over 90 Minutes Intravenous  Once 02/02/19 1747 02/02/19 2237       Assessment/Plan Poorly controlled DM2 - A1c pending S/p bilateral BKA HTN Schizophrenia VDRF DKA AKI, oliguria Leukopenia  Septic shock Perforated prepyloric ulcer, 3,100 cc of contaminated peritoneal fluid S/p EXPLORATORY LAPAROTOMY WITH OVERSEW OF DUODENAL ULCER with [name] patch 02/03/19 Dr. Lucia Gaskins - POD#0 - intraop culture pending - BID wet to dry dressing changes to midline abdominal wound - continue NPO, NGT to LIWS - protonix BID - monitor JP drain output - check prealbumin. May need to consider starting TPN soon  ID - currently vancomycin/zosyn/diflucan 11/23>> FEN - NPO/NGT VTE - SCDs, sq heparin Foley - in place Follow up - TBD    LOS: 1 day    Wellington Hampshire,  [facility] 02/03/2019, 7:30 AM Please see Amion for pager number during day hours 7:00am-4:30pm

## 2019-02-03 NOTE — Progress Notes (Signed)
Initial Nutrition Assessment  DOCUMENTATION CODES:   Not applicable  INTERVENTION:  Monitor for possible initiation of TPN   NUTRITION DIAGNOSIS:   Inadequate oral intake related to altered GI function(pyloric channel ulcer perforation s/p ex lap) as evidenced by NPO status.  GOAL:   Provide needs based on ASPEN/SCCM guidelines   MONITOR:   Labs, I & O's, Vent status, Skin, Weight trends  REASON FOR ASSESSMENT:   Ventilator    ASSESSMENT:  RD working remotely.  58 year old female with past medical history of HTN, schizophrenia, poorly controlled T2DM with diabetic foot wounds s/p bilateral BKA, and recent left phalanx amputation, who presented to ED via EMS with AMS, nausea, vomiting and abdominal pain. Patient accompanied by daughter who reports patient has been complaining of "flu-like symptoms" for the past few days endorsing fever and chills. Patient lives alone, but has a caretaker that lives nearby and found patient on the floor of her home very altered. In ED patient found to be hypothermic and severely hypotensive, BG >1000, CT a/p showed findings concerning for mesenteric ischemia.  11/23 ex lap oversewing of duodenal ulcer with [name] patch, pyloric channel ulcer perforation, 3.1 L of contaminated peritoneal fluid; no obvious small bowel ischemic injury; 19 F drain placed.  Surgery noted possible consideration of TPN soon. Will continue to monitor, estimated needs below based on current MV.  I/Os: +8424 ml since admit     JP drain  180 ml UOP: 275 ml since admit Patient is currently intubated on ventilator support MV: 10.6 L/min Temp (24hrs), Avg:94.8 F (34.9 C), Min:90.1 F (32.3 C), Max:97.3 F (36.3 C)  Propofol: 12.16 ml/hr providing 321 kcal  Medications reviewed and include: Protonix Drips: NaCl Fentanyl 5 ml/hr Myxredlin 100 units/172ml Levophed 4mg  Vasopressin 7.5 ml/hr Zosyn  Labs: CBGs 342-576, Na 129 (L), BUN 35 (H), Cr 1.63 (H), P 5.3  (H)  NUTRITION - FOCUSED PHYSICAL EXAM: Unable to complete at this time, RD working remotely.  Diet Order:   Diet Order            Diet NPO time specified  Diet effective now              EDUCATION NEEDS:   No education needs have been identified at this time  Skin:  Skin Assessment: Reviewed RN Assessment(Bilateral BKA; stage II; coccyx; incision;closed;abdomen)  Last BM:  unknown  Height:   Ht Readings from Last 1 Encounters:  02/03/19 5\' 2"  (1.575 m)    Weight:   Wt Readings from Last 1 Encounters:  02/03/19 62 kg    Ideal Body Weight:  44.2 kg(Adjusted IBW for bilateral BKA)  BMI:  Body mass index is 25 kg/m.  Estimated Nutritional Needs:   Kcal:  [id number]  Protein:  112-124 (1.8-2g/kg)  Fluid:  > 1.3 L/day   Lajuan Lines, RD, LDN Clinical Nutrition [place] Telephone [phone] After Hours/Weekend Pager: [phone]

## 2019-02-03 NOTE — Transfer of Care (Signed)
Immediate Anesthesia Transfer of Care Note  Patient: Gina [name]  Procedure(s) Performed: EXPLORATORY LAPAROTOMY WITH OVERSEW OF DUODENAL ULCER (N/A )  Patient Location: ICU  Anesthesia Type:General  Level of Consciousness: Patient remains intubated per anesthesia plan  Airway & Oxygen Therapy: Patient remains intubated per anesthesia plan and Patient placed on Ventilator (see vital sign flow sheet for setting)  Post-op Assessment: Report given to RN and Post -op Vital signs reviewed and stable  Post vital signs: Reviewed and stable  Last Vitals:  Vitals Value Taken Time  BP    Temp    Pulse    Resp    SpO2      Last Pain:  Vitals:   02/02/19 2246  TempSrc:   PainSc: 0-No pain         Complications: No apparent anesthesia complications

## 2019-02-03 NOTE — Progress Notes (Signed)
NAME:  Gina [name], MRN:  [id number], DOB:  19-Mar-1960, LOS: 1 ADMISSION DATE:  02/02/2019, CONSULTATION DATE:  02/03/19  REFERRING MD:  Sherwood Gambler, CHIEF COMPLAINT:  Abdominal pain    History of present illness   Gina [name] is a 58 y.o. 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 In the ED, she was hypothermic and severely hypotensive. Initial blood glucose >1000.  CT abdomen showed pneumoperitoneum and mesenteric ischemia. She underwent exploratory Laparotomy with finding of perforated prepyloric ulcer and 3.1 L of contaminated peritoneal fluid  Past Medical History  Poorly controlled DM2 with diabetic foot wounds s/p b/l BKA Recent left phalanx amputation HTN Schizophrenia   Consults:  General Surgery (Dr. Lucia Gaskins)  Procedures:  11/23 >> ex lap with oversewing of duodenal ulcer with [name] patch , pyloric channel ulcer perforation with 3.1 L of contaminated peritoneal fluid, no evidence of small bowel ischemia   LDA RIJ 11/22 >> Lt radial a line 11/22 >> RT JP 11/22 >>  Significant Diagnostic Tests:  CT a/p without contrast 11/22 >> findings consistent with hollow viscus perforation and extensive pneumatosis involving multiple loops of small bowel concerning for mesenteric ischemia. The scan also noted cholelithiasis without cholecystitis and bibasilar airspace disease.  CT head 11/22 neg  Micro Data:  Blood 11/22 >> urine  11/22 >>  Antimicrobials:  Cefepime 11/22 (1 dose) Metronidazole 11/22 (1 dose) Vancomycin 11/22- Fluconazole 11/22- Zosyn 11/23 >>   SUBJ -she is critically ill, on pressors, intubated on vent On Levophed and vasopressin Minimal urine output Febrile  Objective   Blood pressure (!) 104/57, pulse 85, temperature (!) 97.2 F (36.2 C), resp. rate 17,  height 5\' 2"  (1.575 m), weight 62 kg, last menstrual period 03/29/2015, SpO2 95 %.    Vent Mode: PRVC FiO2 (%):  [40 %] 40 % Set Rate:  [16 bmp-25 bmp] 25 bmp Vt Set:  [400 mL] 400 mL PEEP:  [5 cmH20] 5 cmH20 Plateau Pressure:  [18 cmH20-19 cmH20] 19 cmH20   Intake/Output Summary (Last 24 hours) at 02/03/2019 [id number] Last data filed at 02/03/2019 [id number] Gross per 24 hour  Intake 8979.38 ml  Output 555 ml  Net 8424.38 ml   Filed Weights   02/02/19 1750 02/03/19 0500  Weight: 57.9 kg 62 kg    Examination: General: Acutely ill-appearing, unresponsive, intubated and sedated HENT: PERRLA, very dry OMM Lungs: CTA b/l, no w/c/r Cardiovascular: tachycardic, no m/r/g Abdomen: Distended, soft, JP drain -copious thin dark brown liquid Extremities: s/p b/l BKA, no edema Neuro: Unresponsive, fentanyl drip just turned off  Chest x-ray personally reviewed which shows no infiltrates, ET tube and other lines in position   Assessment & Plan:   Florid septic shock due to gastric ulcer perforation and peritonitis with DKA  Septic shock 2/2 peritonitis, perforated viscus Probable ischemic colitis, mesenteric ischemia --Contine  Zosyn, fluconazole , dc vanc -Levophed and vasopressin as needed for MAP 65 -Keep NPO -Surgical management of drains  Acute respiratory failure -tolerates pressure support wean but will wait until off pressors before pursuing extubation  DKA -anion gap has resolved but NAG acidosis persists -Change fluids to isotonic bicarb in sterile water at 75/hour until acidosis improved to 7.35 range -Insulin infusion per Endo tool protocol -CBG less than 250, add D5 half at 75   Prerenal AKI, improving -Baseline creatinine ~  0.50 -Fluids as above, monitor BMP every 4 hours -Strict I/O, insert and maintain Foley -Avoid nephrotoxins -Renally dose meds for CrCl <50   Neutropenia-appears to be due to overwhelming sepsis -Follow we will consider Neupogen if drops further   Acute encephalopathy-continue fentanyl drip and low-dose propofol for goal RASS -1 Hold gabapentin and Lexapro as of now   Best practice:  Diet: NPO Pain/Anxiety/Delirium protocol (if indicated): N/A VAP protocol (if indicated): Chlorhexidine DVT prophylaxis: SQH GI prophylaxis: PPI Glucose control: Insulin infusion Mobility: Bed rest Code Status: Full Family Communication: Daughter, [name] [phone]) Disposition: ICU  Summary-perforated viscus has been surgically managed but sepsis and hyperglycemic state no active issues    The patient is critically ill with multiple organ systems failure and requires high complexity decision making for assessment and support, frequent evaluation and titration of therapies, application of advanced monitoring technologies and extensive interpretation of multiple databases. Critical Care Time devoted to patient care services described in this note independent of APP/resident  time is 35 minutes.   Kara Mead MD. [facility]. [facility]  If no response to pager , please call [phone]   02/03/2019

## 2019-02-03 NOTE — Progress Notes (Signed)
Results of ABG :  PH 7.10  CO2  37.7  PO2  95.7  HCO3  11.9   Vent settings for this ABG  400  / 5  / 25 /  40%

## 2019-02-03 NOTE — Progress Notes (Signed)
eLink Physician-Brief Progress Note Patient Name: Gina [name] DOB: 1960-10-28 MRN: [id number]   Date of Service  02/03/2019  HPI/Events of Note  76 F DM (HbA1C >15) initially presented AMS, N/V abdominal pain. DKA on workup. Pneumoperitoneum on imaging s/p repair of perforated duodenal pyloric ulcer  eICU Interventions   Patient remains intubated post op. On norepinephrine and vasopressin. Ongoing fluids. Cefepime and fluconazole for peritonitis. Insulin drip for DKA.  Follow up CXR and labs     Intervention Category Major Interventions: Acid-Base disturbance - evaluation and management;Electrolyte abnormality - evaluation and management;Hypotension - evaluation and management;Shock - evaluation and management;Hyperglycemia - active titration of insulin therapy;Operative interventional procedure - evaluation;Sepsis - evaluation and management Evaluation Type: New Patient Evaluation  Judd Lien 02/03/2019, 3:09 AM

## 2019-02-03 NOTE — Progress Notes (Signed)
CRITICAL VALUE ALERT  Critical Value:  [place] 7.152  Date & Time Notied:  [id number] 02/03/2019  Provider Notified: Critical care team  Orders Received/Actions taken: bicarb drip started

## 2019-02-03 NOTE — ED Notes (Signed)
Patient taken to OR by Md and CRNA.

## 2019-02-03 NOTE — Anesthesia Procedure Notes (Addendum)
Arterial Line Insertion Start/End11/23/2020 1:59 AM, 02/03/2019 1:59 AM Performed by: Pervis Hocking, DO, anesthesiologist  Patient location: Pre-op. Preanesthetic checklist: patient identified, IV checked, site marked, risks and benefits discussed, surgical consent, monitors and equipment checked, pre-op evaluation, timeout performed and anesthesia consent Lidocaine 1% used for infiltration Left, radial was placed Catheter size: 20 G Hand hygiene performed  and maximum sterile barriers used   Attempts: 4 Procedure performed without using ultrasound guided technique. Following insertion, dressing applied. Post procedure assessment: normal and unchanged  Patient tolerated the procedure well with no immediate complications. Additional procedure comments: Multiple attempts unable to thread guidewire, vessel very calcified on ultrasound.

## 2019-02-03 NOTE — Anesthesia Procedure Notes (Signed)
Procedure Name: Intubation Performed by: Gean Maidens, CRNA Pre-anesthesia Checklist: Patient identified, Emergency Drugs available, Suction available, Patient being monitored and Timeout performed Patient Re-evaluated:Patient Re-evaluated prior to induction Oxygen Delivery Method: Circle system utilized Preoxygenation: Pre-oxygenation with 100% oxygen Induction Type: IV induction and Rapid sequence Laryngoscope Size: Mac and 3 Grade View: Grade I Tube type: Oral Tube size: 7.0 mm Number of attempts: 1 Airway Equipment and Method: Stylet Placement Confirmation: ETT inserted through vocal cords under direct vision,  positive ETCO2 and breath sounds checked- equal and bilateral Secured at: 21 cm Tube secured with: Tape Dental Injury: Teeth and Oropharynx as per pre-operative assessment

## 2019-02-03 NOTE — Progress Notes (Signed)
eLink Physician-Brief Progress Note Patient Name: Gina [name] DOB: [birth year] MRN: [id number]   Date of Service  02/03/2019  HPI/Events of Note  Notified of ABG result 7.10/98/95 16/400/40/5 PEEP. Not breathing over the set rate  eICU Interventions  Increase rate to 25, maintain at 400 cc TV for now which is 8cc/kg, Repeat ABG in a few hours and adjust accordingly     Intervention Category Major Interventions: Acid-Base disturbance - evaluation and management;Respiratory failure - evaluation and management  Judd Lien 02/03/2019, 4:22 AM

## 2019-02-03 NOTE — Progress Notes (Signed)
CRITICAL VALUE ALERT  Critical Value:  PH 7.175  Date & Time Notied:  4:03 PM 02/03/2019  Provider Notified: Dr. Elsworth Soho  Orders Received/Actions taken: Changes made to care of plan. See MAR.

## 2019-02-03 NOTE — Progress Notes (Signed)
RN requested quad strength Levophed; RPh confirmed change in pump settings with RN.  Reuel Boom, PharmD, BCPS [phone] 02/03/2019, 4:19 PM

## 2019-02-03 NOTE — Anesthesia Postprocedure Evaluation (Signed)
Anesthesia Post Note  Patient: Gina [name]  Procedure(s) Performed: EXPLORATORY LAPAROTOMY WITH OVERSEW OF DUODENAL ULCER (N/A )     Patient location during evaluation: ICU Anesthesia Type: General Level of consciousness: awake and alert, oriented and patient cooperative Pain management: pain level controlled Vital Signs Assessment: post-procedure vital signs reviewed and stable Respiratory status: spontaneous breathing, nonlabored ventilation and respiratory function stable Cardiovascular status: blood pressure returned to baseline and stable Postop Assessment: no apparent nausea or vomiting Anesthetic complications: no Comments: Transported to ICU with full monitors, ETT/ambu, on pressors and insulin infusion. Full report given to bedside nursing team.     Last Vitals:  Vitals:   02/02/19 2315 02/02/19 2330  BP: (!) 105/54 (!) 104/53  Pulse: 89 85  Resp: 16 (!) 21  Temp: (!) 36.2 C (!) 36.3 C  SpO2: 98% 95%    Last Pain:  Vitals:   02/02/19 2246  TempSrc:   PainSc: 0-No pain                 Pervis Hocking

## 2019-02-03 NOTE — Progress Notes (Signed)
A line not functioning.  DC'd.

## 2019-02-03 NOTE — Op Note (Signed)
02/03/2019  2:26 AM  PATIENT:  Gina [name], 58 y.o., female, MRN: [id number]  PREOP DIAGNOSIS:   perforated bowel  POSTOP DIAGNOSIS:   Perforated prepyloric ulcer, 3,100 cc of contaminated peritoneal fluid  PROCEDURE:   Procedure(s): EXPLORATORY LAPAROTOMY WITH OVERSEW OF DUODENAL ULCER with [name] patch  SURGEON:   Alphonsa Overall, M.D.  ASSISTANT:   None  ANESTHESIA:   general  Anesthesiologist: Pervis Hocking, DO CRNA: Gean Maidens, CRNA  * No anesthesia type entered *  EBL:  minimal  ml  BLOOD ADMINISTERED: none  DRAINS: 19 F Blake drain  LOCAL MEDICATIONS USED:   None  SPECIMEN:   Cultures for aerobe and anaerobe  COUNTS CORRECT:  YES  INDICATIONS FOR PROCEDURE:  Sueko Shafi is a 58 y.o. (DOB: Apr 12, 1960) AA female whose primary care physician is [name], MD and comes for pneumoperitoneum and possible ischemic small bowel.   The indications and risks of the surgery were explained to the patient.  The risks include, but are not limited to, infection, bleeding, and nerve injury.  PROCEDURE: The patient was taken to room #[facility] at [facility].  Her abdomen is prepped with ChloraPrep and sterilely draped.  She had a foley catheter and NGT in place.  A timeout was held the surgical and the surgical checklist run.  I made an upper midline abdominal incision and entered the abdominal cavity.  I aspirated 3,100 cc of yellow cloudy peritoneal fluid from the abdominal cavity.  Aerobic and anaerobic cultures were obtained.  I ran the small bowel that showed no obvious ischemic injury.  The small bowel was dilated.  Right and left lobe liver were unremarkable.  The gallbladder was seen and grossly unremarkable except for the coating of inflammatory changes.  On the anterior antrum at the pylorus the patient had a 1 cm perforation consistent with a perforated pyloric channel ulcer.  I closed the ulcer with three 2-0 silk sutures.  I brought some  transverse colon omentum and tacked down over the repair.  The abdomen was irrigated with a total of 9 L of fluid.  I placed a 64 F drain through a stab wound in the right upper quadrant with a drain laid near the repair.  I closed the abdominal wall with a running #1 Novafil suture and interrupted #1 PDS sutures.  The wound was left open.  Alphonsa Overall, MD, [facility] Surgery Office phone:  [phone]

## 2019-02-03 NOTE — Progress Notes (Signed)
eLink Physician-Brief Progress Note Patient Name: Racquel Lanzo DOB: 03-27-1960 MRN: [id number]   Date of Service  02/03/2019  HPI/Events of Note  RN requested order for serial BMP while on insulin infusion, and a beta OH butyrate  eICU Interventions  Order placed     Intervention Category Minor Interventions: Clinical assessment - ordering diagnostic tests  Margaretmary Lombard 02/03/2019, 7:47 PM

## 2019-02-04 ENCOUNTER — Other Ambulatory Visit: Payer: Self-pay | Admitting: Ophthalmology

## 2019-02-04 ENCOUNTER — Inpatient Hospital Stay (HOSPITAL_COMMUNITY): Payer: Medicare HMO

## 2019-02-04 DIAGNOSIS — Z91030 Bee allergy status: Secondary | ICD-10-CM

## 2019-02-04 DIAGNOSIS — R410 Disorientation, unspecified: Secondary | ICD-10-CM | POA: Diagnosis not present

## 2019-02-04 DIAGNOSIS — E118 Type 2 diabetes mellitus with unspecified complications: Secondary | ICD-10-CM | POA: Diagnosis not present

## 2019-02-04 DIAGNOSIS — J9600 Acute respiratory failure, unspecified whether with hypoxia or hypercapnia: Secondary | ICD-10-CM | POA: Diagnosis not present

## 2019-02-04 DIAGNOSIS — R140 Abdominal distension (gaseous): Secondary | ICD-10-CM | POA: Diagnosis not present

## 2019-02-04 DIAGNOSIS — Z89511 Acquired absence of right leg below knee: Secondary | ICD-10-CM

## 2019-02-04 DIAGNOSIS — R6521 Severe sepsis with septic shock: Secondary | ICD-10-CM | POA: Diagnosis not present

## 2019-02-04 DIAGNOSIS — F209 Schizophrenia, unspecified: Secondary | ICD-10-CM

## 2019-02-04 DIAGNOSIS — N179 Acute kidney failure, unspecified: Secondary | ICD-10-CM | POA: Diagnosis not present

## 2019-02-04 DIAGNOSIS — B379 Candidiasis, unspecified: Secondary | ICD-10-CM | POA: Diagnosis not present

## 2019-02-04 DIAGNOSIS — Z89022 Acquired absence of left finger(s): Secondary | ICD-10-CM

## 2019-02-04 DIAGNOSIS — Z888 Allergy status to other drugs, medicaments and biological substances status: Secondary | ICD-10-CM

## 2019-02-04 DIAGNOSIS — Z89512 Acquired absence of left leg below knee: Secondary | ICD-10-CM

## 2019-02-04 DIAGNOSIS — R011 Cardiac murmur, unspecified: Secondary | ICD-10-CM

## 2019-02-04 DIAGNOSIS — Z9911 Dependence on respirator [ventilator] status: Secondary | ICD-10-CM

## 2019-02-04 DIAGNOSIS — Z8631 Personal history of diabetic foot ulcer: Secondary | ICD-10-CM

## 2019-02-04 DIAGNOSIS — A419 Sepsis, unspecified organism: Secondary | ICD-10-CM | POA: Diagnosis not present

## 2019-02-04 DIAGNOSIS — I10 Essential (primary) hypertension: Secondary | ICD-10-CM

## 2019-02-04 LAB — BLOOD CULTURE ID PANEL (REFLEXED)
Acinetobacter baumannii: NOT DETECTED
Candida albicans: NOT DETECTED
Candida glabrata: DETECTED — AB
Candida krusei: NOT DETECTED
Candida parapsilosis: NOT DETECTED
Candida tropicalis: NOT DETECTED
Enterobacter cloacae complex: NOT DETECTED
Enterobacteriaceae species: NOT DETECTED
Enterococcus species: NOT DETECTED
Escherichia coli: NOT DETECTED
Haemophilus influenzae: NOT DETECTED
Klebsiella oxytoca: NOT DETECTED
Klebsiella pneumoniae: NOT DETECTED
Listeria monocytogenes: NOT DETECTED
Neisseria meningitidis: NOT DETECTED
Proteus species: NOT DETECTED
Pseudomonas aeruginosa: NOT DETECTED
Serratia marcescens: NOT DETECTED
Staphylococcus aureus (BCID): NOT DETECTED
Staphylococcus species: NOT DETECTED
Streptococcus agalactiae: NOT DETECTED
Streptococcus pneumoniae: NOT DETECTED
Streptococcus pyogenes: NOT DETECTED
Streptococcus species: NOT DETECTED

## 2019-02-04 LAB — BASIC METABOLIC PANEL
Anion gap: 12 (ref 5–15)
Anion gap: 14 (ref 5–15)
Anion gap: 17 — ABNORMAL HIGH (ref 5–15)
Anion gap: 20 — ABNORMAL HIGH (ref 5–15)
Anion gap: 15 (ref 5–15)
BUN: 42 mg/dL — ABNORMAL HIGH (ref 6–20)
BUN: 41 mg/dL — ABNORMAL HIGH (ref 6–20)
BUN: 41 mg/dL — ABNORMAL HIGH (ref 6–20)
BUN: 46 mg/dL — ABNORMAL HIGH (ref 6–20)
BUN: 46 mg/dL — ABNORMAL HIGH (ref 6–20)
CO2: 14 mmol/L — ABNORMAL LOW (ref 22–32)
CO2: 15 mmol/L — ABNORMAL LOW (ref 22–32)
CO2: 13 mmol/L — ABNORMAL LOW (ref 22–32)
CO2: 13 mmol/L — ABNORMAL LOW (ref 22–32)
CO2: 19 mmol/L — ABNORMAL LOW (ref 22–32)
Calcium: 7.1 mg/dL — ABNORMAL LOW (ref 8.9–10.3)
Calcium: 7.2 mg/dL — ABNORMAL LOW (ref 8.9–10.3)
Calcium: 6.4 mg/dL — CL (ref 8.9–10.3)
Calcium: 6.4 mg/dL — CL (ref 8.9–10.3)
Calcium: 6.4 mg/dL — CL (ref 8.9–10.3)
Chloride: 105 mmol/L (ref 98–111)
Chloride: 104 mmol/L (ref 98–111)
Chloride: 101 mmol/L (ref 98–111)
Chloride: 100 mmol/L (ref 98–111)
Chloride: 97 mmol/L — ABNORMAL LOW (ref 98–111)
Creatinine, Ser: 2.22 mg/dL — ABNORMAL HIGH (ref 0.44–1.00)
Creatinine, Ser: 2.19 mg/dL — ABNORMAL HIGH (ref 0.44–1.00)
Creatinine, Ser: 2.27 mg/dL — ABNORMAL HIGH (ref 0.44–1.00)
Creatinine, Ser: 2.65 mg/dL — ABNORMAL HIGH (ref 0.44–1.00)
Creatinine, Ser: 2.72 mg/dL — ABNORMAL HIGH (ref 0.44–1.00)
GFR calc Af Amer: 28 mL/min — ABNORMAL LOW (ref >60)
GFR calc Af Amer: 28 mL/min — ABNORMAL LOW (ref >60)
GFR calc Af Amer: 27 mL/min — ABNORMAL LOW (ref >60)
GFR calc Af Amer: 22 mL/min — ABNORMAL LOW (ref >60)
GFR calc Af Amer: 22 mL/min — ABNORMAL LOW (ref >60)
GFR calc non Af Amer: 24 mL/min — ABNORMAL LOW (ref >60)
GFR calc non Af Amer: 24 mL/min — ABNORMAL LOW (ref >60)
GFR calc non Af Amer: 23 mL/min — ABNORMAL LOW (ref >60)
GFR calc non Af Amer: 19 mL/min — ABNORMAL LOW (ref >60)
GFR calc non Af Amer: 19 mL/min — ABNORMAL LOW (ref >60)
Glucose, Bld: 176 mg/dL — ABNORMAL HIGH (ref 70–99)
Glucose, Bld: 209 mg/dL — ABNORMAL HIGH (ref 70–99)
Glucose, Bld: 312 mg/dL — ABNORMAL HIGH (ref 70–99)
Glucose, Bld: 304 mg/dL — ABNORMAL HIGH (ref 70–99)
Glucose, Bld: 274 mg/dL — ABNORMAL HIGH (ref 70–99)
Potassium: 5.7 mmol/L — ABNORMAL HIGH (ref 3.5–5.1)
Potassium: 5.7 mmol/L — ABNORMAL HIGH (ref 3.5–5.1)
Potassium: 5.5 mmol/L — ABNORMAL HIGH (ref 3.5–5.1)
Potassium: 5 mmol/L (ref 3.5–5.1)
Potassium: 4.4 mmol/L (ref 3.5–5.1)
Sodium: 131 mmol/L — ABNORMAL LOW (ref 135–145)
Sodium: 133 mmol/L — ABNORMAL LOW (ref 135–145)
Sodium: 131 mmol/L — ABNORMAL LOW (ref 135–145)
Sodium: 133 mmol/L — ABNORMAL LOW (ref 135–145)
Sodium: 131 mmol/L — ABNORMAL LOW (ref 135–145)

## 2019-02-04 LAB — GLUCOSE, CAPILLARY
Glucose-Capillary: 103 mg/dL — ABNORMAL HIGH (ref 70–99)
Glucose-Capillary: 114 mg/dL — ABNORMAL HIGH (ref 70–99)
Glucose-Capillary: 156 mg/dL — ABNORMAL HIGH (ref 70–99)
Glucose-Capillary: 160 mg/dL — ABNORMAL HIGH (ref 70–99)
Glucose-Capillary: 174 mg/dL — ABNORMAL HIGH (ref 70–99)
Glucose-Capillary: 195 mg/dL — ABNORMAL HIGH (ref 70–99)
Glucose-Capillary: 208 mg/dL — ABNORMAL HIGH (ref 70–99)
Glucose-Capillary: 211 mg/dL — ABNORMAL HIGH (ref 70–99)
Glucose-Capillary: 212 mg/dL — ABNORMAL HIGH (ref 70–99)
Glucose-Capillary: 215 mg/dL — ABNORMAL HIGH (ref 70–99)
Glucose-Capillary: 260 mg/dL — ABNORMAL HIGH (ref 70–99)
Glucose-Capillary: 276 mg/dL — ABNORMAL HIGH (ref 70–99)
Glucose-Capillary: 283 mg/dL — ABNORMAL HIGH (ref 70–99)
Glucose-Capillary: 294 mg/dL — ABNORMAL HIGH (ref 70–99)
Glucose-Capillary: 299 mg/dL — ABNORMAL HIGH (ref 70–99)
Glucose-Capillary: 343 mg/dL — ABNORMAL HIGH (ref 70–99)
Glucose-Capillary: 60 mg/dL — ABNORMAL LOW (ref 70–99)
Glucose-Capillary: 66 mg/dL — ABNORMAL LOW (ref 70–99)
Glucose-Capillary: 70 mg/dL (ref 70–99)
Glucose-Capillary: 78 mg/dL (ref 70–99)
Glucose-Capillary: 81 mg/dL (ref 70–99)

## 2019-02-04 LAB — LACTIC ACID, PLASMA
Lactic Acid, Venous: 7.3 mmol/L (ref 0.5–1.9)
Lactic Acid, Venous: 5.1 mmol/L (ref 0.5–1.9)

## 2019-02-04 LAB — BLOOD GAS, ARTERIAL
Acid-base deficit: 9.8 mmol/L — ABNORMAL HIGH (ref 0.0–2.0)
Bicarbonate: 14.3 mmol/L — ABNORMAL LOW (ref 20.0–28.0)
FIO2: 30
O2 Saturation: 97.6 %
Patient temperature: 101.5
pCO2 arterial: 29.6 mmHg — ABNORMAL LOW (ref 32.0–48.0)
pH, Arterial: 7.316 — ABNORMAL LOW (ref 7.350–7.450)
pO2, Arterial: 106 mmHg (ref 83.0–108.0)

## 2019-02-04 LAB — URINE CULTURE: Culture: >=100000 — AB

## 2019-02-04 LAB — CBC
HCT: 38.4 % (ref 36.0–46.0)
Hemoglobin: 11.8 g/dL — ABNORMAL LOW (ref 12.0–15.0)
MCH: 29.1 pg (ref 26.0–34.0)
MCHC: 30.7 g/dL (ref 30.0–36.0)
MCV: 94.6 fL (ref 80.0–100.0)
Platelets: 67 10*3/uL — ABNORMAL LOW (ref 150–400)
RBC: 4.06 MIL/uL (ref 3.87–5.11)
RDW: 12.7 % (ref 11.5–15.5)
WBC: 5.9 10*3/uL (ref 4.0–10.5)
nRBC: 0 % (ref 0.0–0.2)

## 2019-02-04 LAB — MAGNESIUM: Magnesium: 1.7 mg/dL (ref 1.7–2.4)

## 2019-02-04 LAB — BETA-HYDROXYBUTYRIC ACID
Beta-Hydroxybutyric Acid: 0.37 mmol/L — ABNORMAL HIGH (ref 0.05–0.27)
Beta-Hydroxybutyric Acid: 3.3 mmol/L — ABNORMAL HIGH (ref 0.05–0.27)
Beta-Hydroxybutyric Acid: 0.08 mmol/L (ref 0.05–0.27)

## 2019-02-04 LAB — HEMOGLOBIN A1C
Hgb A1c MFr Bld: 15.2 % — ABNORMAL HIGH (ref 4.8–5.6)
Mean Plasma Glucose: 390 mg/dL

## 2019-02-04 LAB — PHOSPHORUS: Phosphorus: 5.2 mg/dL — ABNORMAL HIGH (ref 2.5–4.6)

## 2019-02-04 MED ORDER — ACETYLCYSTEINE 20 % IN SOLN
RESPIRATORY_TRACT | Status: AC
  Filled 2019-02-04: qty 4

## 2019-02-04 MED ORDER — DEXTROSE 10 % IV SOLN
INTRAVENOUS | Status: AC
  Administered 2019-02-04 – 2019-02-10 (×7): via INTRAVENOUS

## 2019-02-04 MED ORDER — SODIUM CHLORIDE 0.9 % IV SOLN
200.0000 mg | Freq: Once | INTRAVENOUS | Status: AC
  Administered 2019-02-04: 200 mg via INTRAVENOUS
  Filled 2019-02-04: qty 200

## 2019-02-04 MED ORDER — CALCIUM GLUCONATE-NACL 1-0.675 GM/50ML-% IV SOLN
1.0000 g | Freq: Once | INTRAVENOUS | Status: AC
  Administered 2019-02-04: 1000 mg via INTRAVENOUS
  Filled 2019-02-04: qty 50

## 2019-02-04 MED ORDER — CALCIUM GLUCONATE-NACL 2-0.675 GM/100ML-% IV SOLN
2.0000 g | Freq: Once | INTRAVENOUS | Status: AC
  Administered 2019-02-04: 2000 mg via INTRAVENOUS
  Filled 2019-02-04: qty 100

## 2019-02-04 MED ORDER — ALBUMIN HUMAN 25 % IV SOLN
25.0000 g | Freq: Once | INTRAVENOUS | Status: AC
  Administered 2019-02-04: 25 g via INTRAVENOUS
  Filled 2019-02-04: qty 100

## 2019-02-04 MED ORDER — TROPICAMIDE 1 % OP SOLN
1.0000 [drp] | OPHTHALMIC | Status: DC
  Administered 2019-02-04 (×2): 1 [drp] via OPHTHALMIC
  Filled 2019-02-04: qty 3

## 2019-02-04 MED ORDER — SODIUM CHLORIDE 0.9 % IV SOLN
100.0000 mg | INTRAVENOUS | Status: DC
  Administered 2019-02-05 – 2019-02-25 (×21): 100 mg via INTRAVENOUS
  Filled 2019-02-04 (×21): qty 100

## 2019-02-04 MED ORDER — ALTEPLASE 2 MG IJ SOLR
2.0000 mg | Freq: Once | INTRAMUSCULAR | Status: AC
  Administered 2019-02-04: 2 mg
  Filled 2019-02-04: qty 2

## 2019-02-04 MED ORDER — NOREPINEPHRINE BITARTRATE 1 MG/ML IV SOLN
0.0000 ug/min | INTRAVENOUS | Status: DC
  Administered 2019-02-04: 24 ug/min via INTRAVENOUS
  Administered 2019-02-04: 28 ug/min via INTRAVENOUS
  Filled 2019-02-04: qty 4
  Filled 2019-02-04 (×4): qty 16

## 2019-02-04 MED ORDER — PHENYLEPHRINE HCL 2.5 % OP SOLN
1.0000 [drp] | OPHTHALMIC | Status: DC
  Administered 2019-02-04 (×2): 1 [drp] via OPHTHALMIC
  Filled 2019-02-04: qty 15

## 2019-02-04 NOTE — Consult Note (Signed)
Reason for Consult:Rule out endogenous endophthalmitis in a patient with Candidemia.  Referring Physician: Lucianne Lei Dam/ Infectious Disease  Gina [name] is an 58 y.o. female.  HPI: Complex medical history including poorly controlled DM and Candidemia due to perforated gastric ulcer.Ophthalmic consult requested to rule out endogenous endophthalmitis.   Past Medical History:  Diagnosis Date  . Diabetes mellitus   . Hypertension   . Osteomyelitis of right foot ([place]) 02/22/2017  . Schizophrenia ([place])   . Septic arthritis of interphalangeal joint of toe of left foot ([place]) 06/02/2016  . Stress incontinence 04/26/2017    Past Surgical History:  Procedure Laterality Date  . AMPUTATION Left 06/05/2016   Procedure: LEFT FOOT TRANSMETATARSAL AMPUTATION;  Surgeon: [name], MD;  Location[facility];  Service: Orthopedics;  Laterality: Left;  . AMPUTATION Right 11/11/2017   Procedure: AMPUTATION BELOW KNEE;  Surgeon: [name], MD;  Location: [facility];  Service: Orthopedics;  Laterality: Right;  . AMPUTATION Left 05/10/2018   Procedure: LEFT BELOW KNEE AMPUTATION;  Surgeon: [name], MD;  Location: [facility];  Service: Orthopedics;  Laterality: Left;  . AMPUTATION Left 12/28/2018   Procedure: AMPUTATION THIRD DIGIT;  Surgeon: Iran Planas, MD;  [facility];  Service: Orthopedics;  Laterality: Left;  . AMPUTATION TOE Right 02/23/2017   Procedure: AMPUTATION RIGHT THIRD TOE;  Surgeon: [name], MD;  Location: [facility];  Service: Orthopedics;  Laterality: Right;  . I&D EXTREMITY Right 11/09/2017   Procedure: Debride Ulcer Right Heel;  Surgeon: [name], MD;  Location: [facility];  Service: Orthopedics;  Laterality: Right;  . I&D EXTREMITY Left 12/28/2018   Procedure: IRRIGATION AND DEBRIDEMENT EXTREMITY;  Surgeon: Iran Planas, MD;  [facility];  Service: Orthopedics;  Laterality: Left;  . INCISION AND DRAINAGE OF WOUND Left 12/26/2018   Procedure: IRRIGATION AND DEBRIDEMENT LEFT  HAND;  Surgeon: Iran Planas, MD;  [facility];  Service: Orthopedics;  Laterality: Left;  . LAPAROTOMY N/A 02/02/2019   Procedure: EXPLORATORY LAPAROTOMY WITH OVERSEW OF DUODENAL ULCER;  Surgeon: Alphonsa Overall, MD;  [facility];  Service: General;  Laterality: N/A;  . TUBAL LIGATION      Family History  Problem Relation Age of Onset  . Diabetes type II Father   . CAD Father   . Prostate cancer Father   . Diabetes Mellitus II Brother     Social History:  reports that she has never smoked. She has never used smokeless tobacco. She reports that she does not drink alcohol or use drugs.  Allergies:  Allergies  Allergen Reactions  . Bee Venom Swelling    Swells up with bee stings   . Metformin And Related Other (See Comments)    Upset stomach    Medications: I have reviewed the patient's current medications.  Results for orders placed or performed during the hospital encounter of 02/02/19 (from the past 48 hour(s))  SARS Coronavirus 2 by RT PCR (hospital order, performed in [facility] hospital lab) Nasopharyngeal Nasopharyngeal Swab     Status: None   Collection Time: 02/02/19  9:13 PM   Specimen: Nasopharyngeal Swab  Result Value Ref Range   SARS Coronavirus 2 NEGATIVE NEGATIVE    Comment: (NOTE) If result is NEGATIVE SARS-CoV-2 target nucleic acids are NOT DETECTED. The SARS-CoV-2 RNA is generally detectable in upper and lower  respiratory specimens during the acute phase of infection. The lowest  concentration of SARS-CoV-2 viral copies this assay can detect is 250  copies /  mL. A negative result does not preclude SARS-CoV-2 infection  and should not be used as the sole basis for treatment or other  patient management decisions.  A negative result may occur with  improper specimen collection / handling, submission of specimen other  than nasopharyngeal swab, presence of viral mutation(s) within the  areas targeted by this assay, and inadequate number of viral  copies  (<250 copies / mL). A negative result must be combined with clinical  observations, patient history, and epidemiological information. If result is POSITIVE SARS-CoV-2 target nucleic acids are DETECTED. The SARS-CoV-2 RNA is generally detectable in upper and lower  respiratory specimens dur ing the acute phase of infection.  Positive  results are indicative of active infection with SARS-CoV-2.  Clinical  correlation with patient history and other diagnostic information is  necessary to determine patient infection status.  Positive results do  not rule out bacterial infection or co-infection with other viruses. If result is PRESUMPTIVE POSTIVE SARS-CoV-2 nucleic acids MAY BE PRESENT.   A presumptive positive result was obtained on the submitted specimen  and confirmed on repeat testing.  While 2019 novel coronavirus  (SARS-CoV-2) nucleic acids may be present in the submitted sample  additional confirmatory testing may be necessary for epidemiological  and / or clinical management purposes  to differentiate between  SARS-CoV-2 and other Sarbecovirus currently known to infect humans.  If clinically indicated additional testing with an alternate test  methodology [phone]) is advised. The SARS-CoV-2 RNA is generally  detectable in upper and lower respiratory sp ecimens during the acute  phase of infection. The expected result is Negative. Fact Sheet for Patients:  StrictlyIdeas.no Fact Sheet for Healthcare Providers: BankingDealers.co.za This test is not yet approved or cleared by the [place] FDA and has been authorized for detection and/or diagnosis of SARS-CoV-2 by FDA under an Emergency Use Authorization (EUA).  This EUA will remain in effect (meaning this test can be used) for the duration of the COVID-19 declaration under Section 564(b)(1) of the Act, 21 U.S.C. section 360bbb-3(b)(1), unless the authorization is terminated  or revoked sooner. Performed at [facility], [place] [address], [place], [place] [id number]   Basic metabolic panel     Status: Abnormal   Collection Time: 02/02/19  9:48 PM  Result Value Ref Range   Sodium 124 (L) 135 - 145 mmol/L   Potassium 5.3 (H) 3.5 - 5.1 mmol/L   Chloride 94 (L) 98 - 111 mmol/L   CO2 12 (L) 22 - 32 mmol/L   Glucose, Bld 990 (HH) 70 - 99 mg/dL    Comment: CRITICAL RESULT CALLED TO, READ BACK BY AND VERIFIED WITH: NASH RN AT 2335 ON 02/02/2019 BY MOSLEY,J    BUN 38 (H) 6 - 20 mg/dL   Creatinine, Ser 1.73 (H) 0.44 - 1.00 mg/dL   Calcium 8.6 (L) 8.9 - 10.3 mg/dL   GFR calc non Af Amer 32 (L) >60 mL/min   GFR calc Af Amer 37 (L) >60 mL/min   Anion gap 18 (H) 5 - 15    Comment: Performed at [facility], [place] [address], [place],  [address]  Lactic acid, plasma     Status: Abnormal   Collection Time: 02/02/19  9:49 PM  Result Value Ref Range   Lactic Acid, Venous 2.2 (HH) 0.5 - 1.9 mmol/L    Comment: CRITICAL RESULT CALLED TO, READ BACK BY AND VERIFIED WITH: J OXENDINE,RN 02/02/19 2220 RHOLMES Performed at [facility],  [place] [address] [place], [place] [address]   I-stat chem 8, ED (not at [facility] or [facility])     Status: Abnormal   Collection Time: 02/02/19  9:56 PM  Result Value Ref Range   Sodium 123 (L) 135 - 145 mmol/L   Potassium 5.0 3.5 - 5.1 mmol/L   Chloride 95 (L) 98 - 111 mmol/L   BUN 34 (H) 6 - 20 mg/dL   Creatinine, Ser 1.30 (H) 0.44 - 1.00 mg/dL   Glucose, Bld >700 (HH) 70 - 99 mg/dL   Calcium, Ion 1.20 1.15 - 1.40 mmol/L   TCO2 13 (L) 22 - 32 mmol/L   Hemoglobin 10.2 (L) 12.0 - 15.0 g/dL   HCT 30.0 (L) 36.0 - 46.0 %   Comment NOTIFIED PHYSICIAN   Osmolality     Status: Abnormal   Collection Time: 02/02/19 10:16 PM  Result Value Ref Range   Osmolality 338 (HH) 275 - 295 mOsm/kg    Comment: REPEATED TO VERIFY CRITICAL RESULT CALLED TO, READ BACK BY AND VERIFIED WITH: Jillene Bucks, RN AT  0209 ON 02/03/2019 BY Epifanio Lesches S Performed at [facility], [place] [address], [place], [place] [address]   CBG monitoring, ED     Status: Abnormal   Collection Time: 02/02/19 10:22 PM  Result Value Ref Range   Glucose-Capillary >600 (HH) 70 - 99 mg/dL  CBG monitoring, ED     Status: Abnormal   Collection Time: 02/02/19 11:21 PM  Result Value Ref Range   Glucose-Capillary >600 (HH) 70 - 99 mg/dL  Glucose, capillary     Status: Abnormal   Collection Time: 02/03/19  1:02 AM  Result Value Ref Range   Glucose-Capillary >600 (HH) 70 - 99 mg/dL  I-STAT, chem 8     Status: Abnormal   Collection Time: 02/03/19  1:41 AM  Result Value Ref Range   Sodium 131 (L) 135 - 145 mmol/L   Potassium 4.2 3.5 - 5.1 mmol/L   Chloride 103 98 - 111 mmol/L   BUN 28 (H) 6 - 20 mg/dL   Creatinine, Ser 1.20 (H) 0.44 - 1.00 mg/dL   Glucose, Bld >700 (HH) 70 - 99 mg/dL   Calcium, Ion 1.26 1.15 - 1.40 mmol/L   TCO2 13 (L) 22 - 32 mmol/L   Hemoglobin 8.8 (L) 12.0 - 15.0 g/dL   HCT 26.0 (L) 36.0 - 46.0 %   Comment NOTIFIED PHYSICIAN   Basic metabolic panel     Status: Abnormal   Collection Time: 02/03/19  1:48 AM  Result Value Ref Range   Sodium 128 (L) 135 - 145 mmol/L   Potassium 4.6 3.5 - 5.1 mmol/L   Chloride 106 98 - 111 mmol/L   CO2 11 (L) 22 - 32 mmol/L   Glucose, Bld 698 (HH) 70 - 99 mg/dL    Comment: CRITICAL RESULT CALLED TO, READ BACK BY AND VERIFIED WITH: MCMILLIAN,S AT 0530 ON 02/03/2019 BY MOSLEY,J    BUN 36 (H) 6 - 20 mg/dL   Creatinine, Ser 1.61 (H) 0.44 - 1.00 mg/dL   Calcium 8.2 (L) 8.9 - 10.3 mg/dL   GFR calc non Af Amer 35 (L) >60 mL/min   GFR calc Af Amer 41 (L) >60 mL/min   Anion gap 11 5 - 15    Comment: Performed at [facility], [place] [address], [place], [place] [address]  Aerobic/Anaerobic Culture (surgical/deep wound)     Status: None (Preliminary result)   Collection Time: 02/03/19  2:25  AM   Specimen: Peritoneal; Body Fluid  Result Value Ref Range    Specimen Description      PERITONEAL Performed at [place] [address] [place], [place] [address]    Special Requests      NONE Performed at [facility], [place] [address] [place], [place] [address]    Gram Stain NO WBC SEEN RARE BUDDING YEAST SEEN     Culture      ABUNDANT YEAST CULTURE REINCUBATED FOR BETTER GROWTH Performed at [facility], [place] [address], [place], [place] [address]    Report Status PENDING   Glucose, capillary     Status: Abnormal   Collection Time: 02/03/19  2:31 AM  Result Value Ref Range   Glucose-Capillary >600 (HH) 70 - 99 mg/dL  Phosphorus     Status: Abnormal   Collection Time: 02/03/19  2:51 AM  Result Value Ref Range   Phosphorus 5.8 (H) 2.5 - 4.6 mg/dL    Comment: Performed at [facility], [place] [address], [place], [place] [address]  Magnesium     Status: None   Collection Time: 02/03/19  2:51 AM  Result Value Ref Range   Magnesium 1.7 1.7 - 2.4 mg/dL    Comment: Performed at [facility], [place] [address], [place], [place] [address]  MRSA PCR Screening     Status: None   Collection Time: 02/03/19  2:53 AM   Specimen: Nasopharyngeal  Result Value Ref Range   MRSA by PCR NEGATIVE NEGATIVE    Comment:        The GeneXpert MRSA Assay (FDA approved for NASAL specimens only), is one component of a comprehensive MRSA colonization surveillance program. It is not intended to diagnose MRSA infection nor to guide or monitor treatment for MRSA infections. Performed at [facility], [place] [address] [place], [place] [address]   Glucose, capillary     Status: Abnormal   Collection Time: 02/03/19  3:01 AM  Result Value Ref Range   Glucose-Capillary >600 (HH) 70 - 99 mg/dL  Glucose, capillary     Status: Abnormal   Collection Time: 02/03/19  3:03 AM  Result Value Ref Range   Glucose-Capillary >600 (HH) 70 - 99 mg/dL  Blood gas, arterial      Status: Abnormal   Collection Time: 02/03/19  3:35 AM  Result Value Ref Range   FIO2 40.00    pH, Arterial 7.104 (LL) 7.350 - 7.450    Comment: CRITICAL RESULT CALLED TO, READ BACK BY AND VERIFIED WITH: MCMILLIAN,S 0350 ON 02/03/2019 BY MOSLEY,J    pCO2 arterial 37.7 32.0 - 48.0 mmHg   pO2, Arterial 95.7 83.0 - 108.0 mmHg   Bicarbonate 11.9 (L) 20.0 - 28.0 mmol/L   Acid-base deficit 17.9 (H) 0.0 - 2.0 mmol/L   O2 Saturation 96.2 %   Patient temperature 93.9     Comment: Performed at [facility], [place] [address] [place], [place] [address]  Glucose, capillary     Status: Abnormal   Collection Time: 02/03/19  4:21 AM  Result Value Ref Range   Glucose-Capillary 579 (HH) 70 - 99 mg/dL   Comment 1 Notify RN   Glucose, capillary     Status: Abnormal   Collection Time: 02/03/19  5:05 AM  Result Value Ref Range   Glucose-Capillary 508 (HH) 70 - 99 mg/dL   Comment 1 Notify RN   Glucose, capillary     Status:  Abnormal   Collection Time: 02/03/19  5:34 AM  Result Value Ref Range   Glucose-Capillary 576 (HH) 70 - 99 mg/dL   Comment 1 Notify RN   Basic metabolic panel     Status: Abnormal   Collection Time: 02/03/19  5:48 AM  Result Value Ref Range   Sodium 129 (L) 135 - 145 mmol/L   Potassium 4.9 3.5 - 5.1 mmol/L   Chloride 105 98 - 111 mmol/L   CO2 14 (L) 22 - 32 mmol/L   Glucose, Bld 626 (HH) 70 - 99 mg/dL    Comment: CRITICAL RESULT CALLED TO, READ BACK BY AND VERIFIED WITH: LECHFORD,H. RN AT [id number] 02/03/19 MULLINS,T    BUN 35 (H) 6 - 20 mg/dL   Creatinine, Ser 1.63 (H) 0.44 - 1.00 mg/dL   Calcium 8.1 (L) 8.9 - 10.3 mg/dL   GFR calc non Af Amer 35 (L) >60 mL/min   GFR calc Af Amer 40 (L) >60 mL/min   Anion gap 10 5 - 15    Comment: Performed at [facility], [place] [address] [place], [place] [address]  Magnesium     Status: None   Collection Time: 02/03/19  5:48 AM  Result Value Ref Range   Magnesium 1.8 1.7 - 2.4 mg/dL    Comment:  Performed at [facility], [place] [address], [place], [place] [address]  Phosphorus     Status: Abnormal   Collection Time: 02/03/19  5:48 AM  Result Value Ref Range   Phosphorus 5.3 (H) 2.5 - 4.6 mg/dL    Comment: Performed at [facility], [place] [address], [place], [place] [address]  Hemoglobin A1c     Status: Abnormal   Collection Time: 02/03/19  5:48 AM  Result Value Ref Range   Hgb A1c MFr Bld 15.2 (H) 4.8 - 5.6 %    Comment: (NOTE)         Prediabetes: 5.7 - 6.4         Diabetes: >6.4         Glycemic control for adults with diabetes: <7.0    Mean Plasma Glucose 390 mg/dL    Comment: (NOTE) Performed At: [facility] [place], [place] [id number] Rush Farmer MD [id number]   CBC     Status: Abnormal   Collection Time: 02/03/19  5:48 AM  Result Value Ref Range   WBC 1.7 (L) 4.0 - 10.5 K/uL   RBC 3.85 (L) 3.87 - 5.11 MIL/uL   Hemoglobin 11.3 (L) 12.0 - 15.0 g/dL   HCT 37.5 36.0 - 46.0 %   MCV 97.4 80.0 - 100.0 fL   MCH 29.4 26.0 - 34.0 pg   MCHC 30.1 30.0 - 36.0 g/dL   RDW 12.8 11.5 - 15.5 %   Platelets 334 150 - 400 K/uL   nRBC 0.0 0.0 - 0.2 %    Comment: Performed at [facility], [place] [address] [place], [place] [address]  Glucose, capillary     Status: Abnormal   Collection Time: 02/03/19  6:22 AM  Result Value Ref Range   Glucose-Capillary 559 (HH) 70 - 99 mg/dL   Comment 1 Notify RN   Glucose, capillary     Status: Abnormal   Collection Time: 02/03/19  6:52 AM  Result Value Ref Range   Glucose-Capillary 511 (HH) 70 - 99 mg/dL   Comment 1 Document in Chart   Blood gas, arterial     Status: Abnormal  Collection Time: 02/03/19  7:00 AM  Result Value Ref Range   FIO2 40.00    Delivery systems VENTILATOR    Mode PRESSURE REGULATED VOLUME CONTROL    VT 400 mL   LHR 25 resp/min   pH, Arterial 7.152 (LL) 7.350 - 7.450    Comment: CRITICAL RESULT CALLED TO, READ BACK BY AND VERIFIED  WITH: CATALAN,M. @ 0748 02/03/2019 PERRY, J.    pCO2 arterial 33.6 32.0 - 48.0 mmHg   pO2, Arterial 139 (H) 83.0 - 108.0 mmHg   Bicarbonate 11.3 (L) 20.0 - 28.0 mmol/L   Acid-base deficit 16.4 (H) 0.0 - 2.0 mmol/L   O2 Saturation 98.4 %   Patient temperature 98.6    Collection site ARTERIAL LINE    Drawn by [id number]     Comment: Performed at [facility], [place] [address], [place], [place] [address]  Glucose, capillary     Status: Abnormal   Collection Time: 02/03/19  7:51 AM  Result Value Ref Range   Glucose-Capillary 535 (HH) 70 - 99 mg/dL   Comment 1 Notify RN    Comment 2 Document in Chart    Comment 3 Glucose Stabilizer   Glucose, capillary     Status: Abnormal   Collection Time: 02/03/19  8:36 AM  Result Value Ref Range   Glucose-Capillary 451 (H) 70 - 99 mg/dL   Comment 1 Notify RN    Comment 2 Document in Chart    Comment 3 Glucose Stabilizer   Glucose, capillary     Status: Abnormal   Collection Time: 02/03/19  9:06 AM  Result Value Ref Range   Glucose-Capillary 502 (HH) 70 - 99 mg/dL   Comment 1 Notify RN    Comment 2 Document in Chart    Comment 3 Glucose Stabilizer   Glucose, capillary     Status: Abnormal   Collection Time: 02/03/19  9:36 AM  Result Value Ref Range   Glucose-Capillary 469 (H) 70 - 99 mg/dL  Glucose, capillary     Status: Abnormal   Collection Time: 02/03/19 10:15 AM  Result Value Ref Range   Glucose-Capillary 342 (H) 70 - 99 mg/dL  Glucose, capillary     Status: Abnormal   Collection Time: 02/03/19 11:18 AM  Result Value Ref Range   Glucose-Capillary 349 (H) 70 - 99 mg/dL  Basic metabolic panel     Status: Abnormal   Collection Time: 02/03/19 11:40 AM  Result Value Ref Range   Sodium 133 (L) 135 - 145 mmol/L   Potassium 4.7 3.5 - 5.1 mmol/L   Chloride 111 98 - 111 mmol/L   CO2 11 (L) 22 - 32 mmol/L   Glucose, Bld 335 (H) 70 - 99 mg/dL   BUN 39 (H) 6 - 20 mg/dL   Creatinine, Ser 1.96 (H) 0.44 - 1.00 mg/dL   Calcium 8.1  (L) 8.9 - 10.3 mg/dL   GFR calc non Af Amer 28 (L) >60 mL/min   GFR calc Af Amer 32 (L) >60 mL/min   Anion gap 11 5 - 15    Comment: Performed at [facility], [place] [address], [place],  [address]  Glucose, capillary     Status: Abnormal   Collection Time: 02/03/19 12:25 PM  Result Value Ref Range   Glucose-Capillary 406 (H) 70 - 99 mg/dL  Glucose, capillary     Status: Abnormal   Collection Time: 02/03/19  1:34 PM  Result Value Ref Range   Glucose-Capillary 253 (H) 70 -  99 mg/dL  Glucose, capillary     Status: Abnormal   Collection Time: 02/03/19  2:40 PM  Result Value Ref Range   Glucose-Capillary 244 (H) 70 - 99 mg/dL  Glucose, capillary     Status: Abnormal   Collection Time: 02/03/19  3:39 PM  Result Value Ref Range   Glucose-Capillary 168 (H) 70 - 99 mg/dL  Blood gas, arterial     Status: Abnormal   Collection Time: 02/03/19  3:50 PM  Result Value Ref Range   FIO2 30.00    pH, Arterial 7.175 (LL) 7.350 - 7.450    Comment: CRITICAL RESULT CALLED TO, READ BACK BY AND VERIFIED WITH: MICHELLE CATALAN,RN [id number] @ 1603 BY J SCOTTON    pCO2 arterial 27.7 (L) 32.0 - 48.0 mmHg   pO2, Arterial 153 (H) 83.0 - 108.0 mmHg   Bicarbonate 9.8 (L) 20.0 - 28.0 mmol/L   Acid-base deficit 17.4 (H) 0.0 - 2.0 mmol/L   O2 Saturation 98.9 %   Patient temperature 98.6     Comment: Performed at [facility], [place] [address] [place], [place] [id number]  Basic metabolic panel     Status: Abnormal   Collection Time: 02/03/19  4:02 PM  Result Value Ref Range   Sodium 135 135 - 145 mmol/L   Potassium 4.7 3.5 - 5.1 mmol/L   Chloride 112 (H) 98 - 111 mmol/L   CO2 13 (L) 22 - 32 mmol/L   Glucose, Bld 98 70 - 99 mg/dL   BUN 38 (H) 6 - 20 mg/dL   Creatinine, Ser 1.95 (H) 0.44 - 1.00 mg/dL   Calcium 8.1 (L) 8.9 - 10.3 mg/dL   GFR calc non Af Amer 28 (L) >60 mL/min   GFR calc Af Amer 32 (L) >60 mL/min   Anion gap 10 5 - 15    Comment: Performed at [facility], [place] [address] [place], [place] [address]  Prealbumin     Status: Abnormal   Collection Time: 02/03/19  4:02 PM  Result Value Ref Range   Prealbumin 6.2 (L) 18 - 38 mg/dL    Comment: Performed at [facility], [place] [address] [place], [place] [address]  Glucose, capillary     Status: Abnormal   Collection Time: 02/03/19  4:42 PM  Result Value Ref Range   Glucose-Capillary 131 (H) 70 - 99 mg/dL  Glucose, capillary     Status: Abnormal   Collection Time: 02/03/19  5:39 PM  Result Value Ref Range   Glucose-Capillary 116 (H) 70 - 99 mg/dL   Comment 1 Notify RN    Comment 2 Document in Chart    Comment 3 Glucose Stabilizer   Glucose, capillary     Status: None   Collection Time: 02/03/19  6:39 PM  Result Value Ref Range   Glucose-Capillary 86 70 - 99 mg/dL  Glucose, capillary     Status: Abnormal   Collection Time: 02/03/19  8:28 PM  Result Value Ref Range   Glucose-Capillary 56 (L) 70 - 99 mg/dL  Glucose, capillary     Status: None   Collection Time: 02/03/19  8:31 PM  Result Value Ref Range   Glucose-Capillary 77 70 - 99 mg/dL   Comment 1 Notify RN    Comment 2 Document in Chart   Glucose, capillary     Status: Abnormal   Collection Time: 02/03/19  9:32 PM  Result Value Ref Range   Glucose-Capillary 58 (L) 70 - 99  mg/dL  Glucose, capillary     Status: None   Collection Time: 02/03/19 10:00 PM  Result Value Ref Range   Glucose-Capillary 70 70 - 99 mg/dL  CBC     Status: Abnormal   Collection Time: 02/03/19 10:35 PM  Result Value Ref Range   WBC 6.4 4.0 - 10.5 K/uL   RBC 4.23 3.87 - 5.11 MIL/uL   Hemoglobin 12.7 12.0 - 15.0 g/dL   HCT 39.3 36.0 - 46.0 %   MCV 92.9 80.0 - 100.0 fL   MCH 30.0 26.0 - 34.0 pg   MCHC 32.3 30.0 - 36.0 g/dL   RDW 12.6 11.5 - 15.5 %   Platelets PLATELET CLUMPS NOTED ON SMEAR, UNABLE TO ESTIMATE 150 - 400 K/uL   nRBC 0.5 (H) 0.0 - 0.2 %    Comment: Performed at [facility], [place]  [address] [place], [place] [id number]  Basic metabolic panel     Status: Abnormal   Collection Time: 02/03/19 10:35 PM  Result Value Ref Range   Sodium 135 135 - 145 mmol/L   Potassium 5.5 (H) 3.5 - 5.1 mmol/L   Chloride 109 98 - 111 mmol/L   CO2 14 (L) 22 - 32 mmol/L   Glucose, Bld 117 (H) 70 - 99 mg/dL   BUN 39 (H) 6 - 20 mg/dL   Creatinine, Ser 2.06 (H) 0.44 - 1.00 mg/dL   Calcium 7.7 (L) 8.9 - 10.3 mg/dL   GFR calc non Af Amer 26 (L) >60 mL/min   GFR calc Af Amer 30 (L) >60 mL/min   Anion gap 12 5 - 15    Comment: Performed at [facility], [place] [address] [place], [place] [address]  Beta-hydroxybutyric acid     Status: None   Collection Time: 02/03/19 10:35 PM  Result Value Ref Range   Beta-Hydroxybutyric Acid 0.11 0.05 - 0.27 mmol/L    Comment: Performed at [facility], [place] [address], [place], [place] [address]  Glucose, capillary     Status: Abnormal   Collection Time: 02/03/19 11:23 PM  Result Value Ref Range   Glucose-Capillary 55 (L) 70 - 99 mg/dL   Comment 1 Notify RN    Comment 2 Document in Chart   Glucose, capillary     Status: Abnormal   Collection Time: 02/03/19 11:28 PM  Result Value Ref Range   Glucose-Capillary 63 (L) 70 - 99 mg/dL   Comment 1 Notify RN    Comment 2 Document in Chart   Glucose, capillary     Status: None   Collection Time: 02/03/19 11:47 PM  Result Value Ref Range   Glucose-Capillary 77 70 - 99 mg/dL   Comment 1 Notify RN    Comment 2 Document in Chart   Glucose, capillary     Status: Abnormal   Collection Time: 02/04/19 12:51 AM  Result Value Ref Range   Glucose-Capillary 212 (H) 70 - 99 mg/dL  Glucose, capillary     Status: Abnormal   Collection Time: 02/04/19 12:52 AM  Result Value Ref Range   Glucose-Capillary 208 (H) 70 - 99 mg/dL   Comment 1 Notify RN    Comment 2 Document in Chart   Glucose, capillary     Status: Abnormal   Collection Time: 02/04/19 12:55 AM  Result Value Ref Range    Glucose-Capillary 103 (H) 70 - 99 mg/dL  Blood gas, arterial     Status: Abnormal   Collection Time: 02/04/19  1:00  AM  Result Value Ref Range   FIO2 30.00    pH, Arterial 7.316 (L) 7.350 - 7.450   pCO2 arterial 29.6 (L) 32.0 - 48.0 mmHg   pO2, Arterial 106 83.0 - 108.0 mmHg   Bicarbonate 14.3 (L) 20.0 - 28.0 mmol/L   Acid-base deficit 9.8 (H) 0.0 - 2.0 mmol/L   O2 Saturation 97.6 %   Patient temperature 101.5    Allens test (pass/fail) PASS PASS    Comment: Performed at [facility], [place] [address], [place], [place] [address]  CBC     Status: Abnormal   Collection Time: 02/04/19  1:20 AM  Result Value Ref Range   WBC 5.9 4.0 - 10.5 K/uL   RBC 4.06 3.87 - 5.11 MIL/uL   Hemoglobin 11.8 (L) 12.0 - 15.0 g/dL   HCT 38.4 36.0 - 46.0 %   MCV 94.6 80.0 - 100.0 fL   MCH 29.1 26.0 - 34.0 pg   MCHC 30.7 30.0 - 36.0 g/dL   RDW 12.7 11.5 - 15.5 %   Platelets 67 (L) 150 - 400 K/uL    Comment: REPEATED TO VERIFY PLATELET COUNT CONFIRMED BY SMEAR SPECIMEN CHECKED FOR CLOTS Immature Platelet Fraction may be clinically indicated, consider ordering this additional test [id number]    nRBC 0.0 0.0 - 0.2 %    Comment: Performed at [facility], [place] [address] [place], [place] [id number]  Basic metabolic panel     Status: Abnormal   Collection Time: 02/04/19  1:20 AM  Result Value Ref Range   Sodium 131 (L) 135 - 145 mmol/L   Potassium 5.7 (H) 3.5 - 5.1 mmol/L   Chloride 105 98 - 111 mmol/L   CO2 14 (L) 22 - 32 mmol/L   Glucose, Bld 176 (H) 70 - 99 mg/dL   BUN 42 (H) 6 - 20 mg/dL   Creatinine, Ser 2.22 (H) 0.44 - 1.00 mg/dL   Calcium 7.1 (L) 8.9 - 10.3 mg/dL   GFR calc non Af Amer 24 (L) >60 mL/min   GFR calc Af Amer 28 (L) >60 mL/min   Anion gap 12 5 - 15    Comment: Performed at [facility], [place] [address], [place], [place] [address]  Magnesium     Status: None   Collection Time: 02/04/19  1:20 AM  Result Value Ref Range    Magnesium 1.7 1.7 - 2.4 mg/dL    Comment: Performed at [facility], [place] [address] [place], [place] [address]  Phosphorus     Status: Abnormal   Collection Time: 02/04/19  1:20 AM  Result Value Ref Range   Phosphorus 5.2 (H) 2.5 - 4.6 mg/dL    Comment: Performed at [facility], [place] [address], [place], [place] [address]  Lactic acid, plasma     Status: Abnormal   Collection Time: 02/04/19  1:20 AM  Result Value Ref Range   Lactic Acid, Venous 7.3 (HH) 0.5 - 1.9 mmol/L    Comment: CRITICAL RESULT CALLED TO, READ BACK BY AND VERIFIED WITH: CHANEL MCMILLIAN @ [id number] ON 02/04/2019 C VARNER Performed at [facility], [place] [address] [place], [place] [address]   Glucose, capillary     Status: None   Collection Time: 02/04/19  2:10 AM  Result Value Ref Range   Glucose-Capillary 81 70 - 99 mg/dL  Basic metabolic panel     Status: Abnormal   Collection Time: 02/04/19  2:56 AM  Result  Value Ref Range   Sodium 133 (L) 135 - 145 mmol/L   Potassium 5.7 (H) 3.5 - 5.1 mmol/L   Chloride 104 98 - 111 mmol/L   CO2 15 (L) 22 - 32 mmol/L   Glucose, Bld 209 (H) 70 - 99 mg/dL   BUN 41 (H) 6 - 20 mg/dL   Creatinine, Ser 2.19 (H) 0.44 - 1.00 mg/dL   Calcium 7.2 (L) 8.9 - 10.3 mg/dL   GFR calc non Af Amer 24 (L) >60 mL/min   GFR calc Af Amer 28 (L) >60 mL/min   Anion gap 14 5 - 15    Comment: Performed at [facility], [place] [address], [place], [place] [address]  Beta-hydroxybutyric acid     Status: Abnormal   Collection Time: 02/04/19  2:57 AM  Result Value Ref Range   Beta-Hydroxybutyric Acid 0.37 (H) 0.05 - 0.27 mmol/L    Comment: Performed at [facility], [place] [address] [place], [place] [address]  Glucose, capillary     Status: Abnormal   Collection Time: 02/04/19  3:10 AM  Result Value Ref Range   Glucose-Capillary 60 (L) 70 - 99 mg/dL  Glucose, capillary     Status: Abnormal   Collection Time:  02/04/19  3:12 AM  Result Value Ref Range   Glucose-Capillary 114 (H) 70 - 99 mg/dL  Glucose, capillary     Status: Abnormal   Collection Time: 02/04/19  5:38 AM  Result Value Ref Range   Glucose-Capillary 156 (H) 70 - 99 mg/dL  Basic metabolic panel     Status: Abnormal   Collection Time: 02/04/19  8:41 AM  Result Value Ref Range   Sodium 131 (L) 135 - 145 mmol/L   Potassium 5.5 (H) 3.5 - 5.1 mmol/L    Comment: NO VISIBLE HEMOLYSIS   Chloride 101 98 - 111 mmol/L   CO2 13 (L) 22 - 32 mmol/L   Glucose, Bld 312 (H) 70 - 99 mg/dL   BUN 41 (H) 6 - 20 mg/dL   Creatinine, Ser 2.27 (H) 0.44 - 1.00 mg/dL   Calcium 6.4 (LL) 8.9 - 10.3 mg/dL    Comment: CRITICAL RESULT CALLED TO, READ BACK BY AND VERIFIED WITH: JOHNSON,R. RN AT 1008 02/04/19 MULLINS,T    GFR calc non Af Amer 23 (L) >60 mL/min   GFR calc Af Amer 27 (L) >60 mL/min   Anion gap 17 (H) 5 - 15    Comment: Performed at [facility], [place] [address] [place], [place] [address]  Beta-hydroxybutyric acid     Status: Abnormal   Collection Time: 02/04/19 11:25 AM  Result Value Ref Range   Beta-Hydroxybutyric Acid 3.30 (H) 0.05 - 0.27 mmol/L    Comment: Performed at [facility], [place] [address] [place], [place] [address]  Lactic acid, plasma     Status: Abnormal   Collection Time: 02/04/19 11:25 AM  Result Value Ref Range   Lactic Acid, Venous 5.1 (HH) 0.5 - 1.9 mmol/L    Comment: CRITICAL VALUE NOTED.  VALUE IS CONSISTENT WITH PREVIOUSLY REPORTED AND CALLED VALUE. Performed at [facility], [place] [address] [place], [place] [address]   Glucose, capillary     Status: Abnormal   Collection Time: 02/04/19 12:03 PM  Result Value Ref Range   Glucose-Capillary 343 (H) 70 - 99 mg/dL  Basic metabolic panel     Status: Abnormal   Collection Time: 02/04/19 12:49 PM  Result Value Ref Range  Sodium 133 (L) 135 - 145 mmol/L   Potassium 5.0 3.5 - 5.1 mmol/L   Chloride 100 98 - 111  mmol/L   CO2 13 (L) 22 - 32 mmol/L   Glucose, Bld 304 (H) 70 - 99 mg/dL   BUN 46 (H) 6 - 20 mg/dL   Creatinine, Ser 2.65 (H) 0.44 - 1.00 mg/dL   Calcium 6.4 (LL) 8.9 - 10.3 mg/dL    Comment: CRITICAL RESULT CALLED TO, READ BACK BY AND VERIFIED WITH: JOHNSON,R. RN AT 1342 02/04/19 MULLINS,T    GFR calc non Af Amer 19 (L) >60 mL/min   GFR calc Af Amer 22 (L) >60 mL/min   Anion gap 20 (H) 5 - 15    Comment: Performed at [facility], [place] [address] [place], [place] [address]  Glucose, capillary     Status: Abnormal   Collection Time: 02/04/19  1:02 PM  Result Value Ref Range   Glucose-Capillary 276 (H) 70 - 99 mg/dL  Glucose, capillary     Status: Abnormal   Collection Time: 02/04/19  2:21 PM  Result Value Ref Range   Glucose-Capillary 160 (H) 70 - 99 mg/dL  Glucose, capillary     Status: Abnormal   Collection Time: 02/04/19  3:26 PM  Result Value Ref Range   Glucose-Capillary 211 (H) 70 - 99 mg/dL  Glucose, capillary     Status: Abnormal   Collection Time: 02/04/19  4:35 PM  Result Value Ref Range   Glucose-Capillary 174 (H) 70 - 99 mg/dL  Glucose, capillary     Status: None   Collection Time: 02/04/19  5:35 PM  Result Value Ref Range   Glucose-Capillary 78 70 - 99 mg/dL  Glucose, capillary     Status: Abnormal   Collection Time: 02/04/19  6:08 PM  Result Value Ref Range   Glucose-Capillary 215 (H) 70 - 99 mg/dL  Glucose, capillary     Status: Abnormal   Collection Time: 02/04/19  7:20 PM  Result Value Ref Range   Glucose-Capillary 66 (L) 70 - 99 mg/dL  Glucose, capillary     Status: Abnormal   Collection Time: 02/04/19  7:44 PM  Result Value Ref Range   Glucose-Capillary 260 (H) 70 - 99 mg/dL    Ct Abdomen Pelvis Wo Contrast  Result Date: 02/02/2019 CLINICAL DATA:  Abdominal pain. EXAM: CT ABDOMEN AND PELVIS WITHOUT CONTRAST TECHNIQUE: Multidetector CT imaging of the abdomen and pelvis was performed following the standard protocol without IV  contrast. COMPARISON:  06/15/2013 FINDINGS: Lower chest: There is mild airspace disease at the lung bases bilaterally.The heart size is normal. The intracardiac blood pool is hypodense relative to the adjacent myocardium consistent with anemia. Hepatobiliary: The liver is normal. Cholelithiasis without acute inflammation.There is no biliary ductal dilation. Pancreas: Normal contours without ductal dilatation. No peripancreatic fluid collection. Spleen: No splenic laceration or hematoma. Adrenals/Urinary Tract: --Adrenal glands: No adrenal hemorrhage. --Right kidney/ureter: No hydronephrosis or perinephric hematoma. --Left kidney/ureter: No hydronephrosis or perinephric hematoma. --Urinary bladder: The bladder is decompressed with a Foley catheter which limits evaluation. Stomach/Bowel: --Stomach/Duodenum: There is some wall thickening of the distal esophagus. The stomach is mildly distended. --Small bowel: There is pneumatosis involving multiple small bowel loops. The small bowel is mildly dilated without evidence for a high-grade obstruction. --Colon: No focal abnormality. --Appendix: Normal. Vascular/Lymphatic: There are minimal atherosclerotic changes of the abdominal aorta. Gas is noted within the SMV and draining mesenteric veins. --there are some prominent retroperitoneal lymph nodes that are  suboptimally evaluated in the absence of IV contrast. --No mesenteric lymphadenopathy. --No pelvic or inguinal lymphadenopathy. Reproductive: Unremarkable Other: There is a large amount of free air within the abdomen. There is a moderate volume of somewhat complex free fluid in the abdomen. There is mild body wall edema. Musculoskeletal. No acute displaced fractures. IMPRESSION: 1. Findings consistent with a hollow viscus perforation as evidence by a large volume of free air and a moderate volume a complex free fluid throughout the abdomen. 2. 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 Gas is noted  within the SMV and draining mesenteric veins. Findings are concerning for mesenteric ischemia 3. There is cholelithiasis without secondary signs of acute cholecystitis. 4. Bibasilar airspace disease may represent atelectasis, pneumonia, or aspiration in the appropriate clinical setting. These results were called by telephone at the time of interpretation on 02/02/2019 at 9:25 pm to provider Sherwood Gambler , who verbally acknowledged these results. Electronically Signed   By: Constance Holster M.D.   On: 02/02/2019 21:42   Ct Head Wo Contrast  Result Date: 02/02/2019 CLINICAL DATA:  Found on the floor at home.  Nausea and vomiting. EXAM: CT HEAD WITHOUT CONTRAST TECHNIQUE: Contiguous axial images were obtained from the base of the skull through the vertex without intravenous contrast. COMPARISON:  03/13/2018 FINDINGS: Brain: No evidence of acute infarction, hemorrhage, hydrocephalus, extra-axial collection or mass lesion/mass effect. Vascular: No hyperdense vessel or unexpected calcification. Skull: Normal. Negative for fracture or focal lesion. Sinuses/Orbits: Globes and orbits are unremarkable. Visualized sinuses and mastoid air cells are clear. Other: None. IMPRESSION: Normal enhanced CT scan of the brain. Electronically Signed   By: Lajean Manes M.D.   On: 02/02/2019 21:38   Dg Chest Port 1 View  Result Date: 02/04/2019 CLINICAL DATA:  Shortness of breath. EXAM: PORTABLE CHEST 1 VIEW COMPARISON:  One-view chest x-ray 02/03/2019 FINDINGS: The heart size is normal. Endotracheal tube is stable. NG tube terminates in the stomach. Right IJ line is stable. Aeration in lung volumes are slightly improved. Mild pulmonary vascular congestion remains. IMPRESSION: 1. Slight improved aeration with persistent mild pulmonary vascular congestion. 2. Support apparatus is stable. Electronically Signed   By: San Morelle M.D.   On: 02/04/2019 08:36   Dg Chest Port 1 View  Result Date: 02/03/2019 CLINICAL  DATA:  ET and NG placement EXAM: PORTABLE CHEST 1 VIEW COMPARISON:  Radiograph 02/02/2019 FINDINGS: Endotracheal tube terminates in the mid trachea, 4.5 cm from the carina. Transesophageal tube tip and side port are distal to the GE junction curling in the left upper quadrant. Right IJ approach catheter sheath tip terminates at the superior cavoatrial junction. Lung volumes are low with some streaky atelectatic changes. Cardiomediastinal contours are unremarkable for portable technique. No acute osseous or soft tissue abnormality. IMPRESSION: 1. Endotracheal tube terminates in the mid trachea, 4.5 cm from the carina. 2. Transesophageal tube tip and side port are distal to the GE junction. 3. Right IJ catheter terminates at the superior cavoatrial junction. 4. Basilar atelectasis otherwise no acute cardiopulmonary abnormality. Electronically Signed   By: Lovena Le M.D.   On: 02/03/2019 03:35    Review of Systems  Unable to perform ROS: Intubated   Blood pressure (!) 188/75, pulse (!) 103, temperature (!) 101.5 F (38.6 C), resp. rate (!) 22, height 5\' 2"  (1.575 m), weight 63.2 kg, last menstrual period 03/29/2015, SpO2 100 %. Physical Exam  Eyes: Conjunctivae and lids are normal.  Dilated exam shows mild nuclear sclerosis OU. Vitreous is  clear OU. Fundus with moderate diabetic retinopathy. No evidence of endogenous endophthalmitis OU.  Assessment/Plan: 1. 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 No intervention indicated by ophtho 2. NIDDM with moderate NPDR OU. No intervention indicated by ophtho at this time. Advise follow up as out patient when discharged.    Denham Mose B 02/04/2019, 7:55 PM

## 2019-02-04 NOTE — Progress Notes (Signed)
NAME:  Gina [name], MRN:  [id number], DOB:  11/26/1960, LOS: 2 ADMISSION DATE:  02/02/2019, CONSULTATION DATE:  02/04/19  REFERRING MD:  Sherwood Gambler, CHIEF COMPLAINT:  Abdominal pain    History of present illness   Gina [name] is a 58 y.o. 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 In the ED, she was hypothermic and severely hypotensive. Initial blood glucose >1000.  CT abdomen showed pneumoperitoneum and mesenteric ischemia. She underwent exploratory Laparotomy with finding of perforated prepyloric ulcer and 3.1 L of contaminated peritoneal fluid  Past Medical History  Poorly controlled DM2 with diabetic foot wounds s/p b/l BKA Recent left phalanx amputation HTN Schizophrenia   Consults:  General Surgery (Dr. Lucia Gaskins)  Procedures:  11/23 >> ex lap with oversewing of duodenal ulcer with [name] patch , pyloric channel ulcer perforation with 3.1 L of contaminated peritoneal fluid, no evidence of small bowel ischemia   LDA RIJ 11/22 >> Lt radial a line 11/22 >>11/23 RT JP 11/22 >>  Significant Diagnostic Tests:  CT a/p without contrast 11/22 >> findings consistent with hollow viscus perforation and extensive pneumatosis involving multiple loops of small bowel concerning for mesenteric ischemia. The scan also noted cholelithiasis without cholecystitis and bibasilar airspace disease.  CT head 11/22 neg  Micro Data:  Blood 11/22 >> Candida glabrata urine  11/22 >> klebsiella  Antimicrobials:  Cefepime 11/22 (1 dose) Metronidazole 11/22 (1 dose) Vancomycin 11/22 >>11/23 Fluconazole 11/22 >>11/24 Zosyn 11/23 >> Anidulafungin 11/24 >>   SUBJ -remains critically ill, intubated, on slightly lower doses of Levophed Urine output marginal but picking up Remains unresponsive, fentanyl turned  off this morning Insulin drip turned off due to hypoglycemia  Objective   Blood pressure (!) 90/34, pulse (!) 114, temperature (!) 102.2 F (39 C), resp. rate (!) 25, height 5\' 2"  (1.575 m), weight 63.2 kg, last menstrual period 03/29/2015, SpO2 99 %.    Vent Mode: PRVC FiO2 (%):  [30 %-40 %] 30 % Set Rate:  [25 bmp] 25 bmp Vt Set:  [400 mL] 400 mL PEEP:  [5 cmH20] 5 cmH20 Pressure Support:  [10 cmH20] 10 cmH20 Plateau Pressure:  [14 cmH20-20 cmH20] 18 cmH20   Intake/Output Summary (Last 24 hours) at 02/04/2019 1108 Last data filed at 02/04/2019 0845 Gross per 24 hour  Intake 5744.65 ml  Output 3360 ml  Net 2384.65 ml   Filed Weights   02/02/19 1750 02/03/19 0500 02/04/19 0500  Weight: 57.9 kg 62 kg 63.2 kg    Examination: General: Acutely ill-appearing, unresponsive, intubated and sedated HENT: PERRLA, very dry OMM Lungs: Decreased breath sounds bilateral, no rhonchi Cardiovascular: tachycardic, no m/r/g Abdomen: Distended, soft, JP drain -about 2 L of thin dark brown liquid last 24 hours Extremities: s/p b/l BKA, no edema Neuro: Off fentanyl, eyes open to name, does not follow commands  Chest x-ray 11/24 personally reviewed which shows ET tube slight high, improved aeration bibasal    Assessment & Plan:   Florid septic shock due to gastric ulcer perforation and peritonitis with DKA -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  Septic shock 2/2 peritonitis, perforated viscus Fungemia -Candida glabrata Klebsiella UTI Probable ischemic colitis, mesenteric ischemia --Contine  Zosyn, change antifungal to anidulafungin -Levophed and vasopressin as needed for MAP 65 -expect slow taper once fungemia adequately treated -Keep NPO -Surgical management of drains -  JP is putting out large amounts but hemoglobin stable  Acute respiratory failure -tolerates pressure support wean but will wait for hemodynamic improvement before pursuing extubation  DKA -anion gap initially  resolved but now back up to 17 and beta hydroxy butyrate slight high Hypoglycemia 11/23 -Tinea isotonic bicarb in sterile water at 75/hour until acidosis improved to 7.35 range -Restart insulin infusion per Endo tool protocol and continue until beta hydroxybutyrate normalizes -Continue D10 to avoid hypoglycemia   AKI -prerenal versus ATN , urine output starting to improve -Baseline creatinine ~0.50 -Fluids as above, monitor BMP every 6 hours -Strict I/O, insert and maintain Foley -Avoid nephrotoxins -Renally dose meds for CrCl <50    Acute encephalopathy-position to intermittent fentanyl for goal RASS -1 Hold gabapentin and Lexapro as of now  Resolved problems- Neutropenia-transient due to  overwhelming sepsis  Summary-persistent lactate &  pressors due to fungemia, bowel ischemia is also a concern but none found Intra-Op. Persistent anion gap and high beta hydroxybutyrate suggesting that DKA is ongoing hence maintained on insulin drip   Best practice:  Diet: NPO Pain/Anxiety/Delirium protocol (if indicated): N/A VAP protocol (if indicated): Chlorhexidine DVT prophylaxis: SQH GI prophylaxis: PPI Glucose control: Insulin infusion Mobility: Bed rest Code Status: Full Family Communication: Daughter, [name] [phone]) Disposition: ICU  The patient is critically ill with multiple organ systems failure and requires high complexity decision making for assessment and support, frequent evaluation and titration of therapies, application of advanced monitoring technologies and extensive interpretation of multiple databases. Critical Care Time devoted to patient care services described in this note independent of APP/resident  time is 35 minutes.      Kara Mead MD. [place].  Pulmonary & Critical care  If no response to pager , please call [phone]   02/04/2019

## 2019-02-04 NOTE — Progress Notes (Signed)
eLink Physician-Brief Progress Note Patient Name: Gina [name] DOB: 11/29/1960 MRN: [id number]   Date of Service  02/04/2019  HPI/Events of Note  Hypoglycemia. RN notified us now that insulin infusion has been off since day shift (unsure what time). Is on bicarb drip as well as d51/2 ns at 50 cc/hour. Several glucose levels have been low  eICU Interventions  Stop d51/2 ns Switch to d10 Keep insulin gtt off Continue hourly glucsoe checks for now  Check abg and lactate Beta hydroxybutyrate was negative on last check Asked RN to call us with labs and to notify us if hyperglycemic on d10     Intervention Category Intermediate Interventions: Hyperglycemia - evaluation and treatment;Other:  Margaretmary Lombard 02/04/2019, 12:26 AM

## 2019-02-04 NOTE — Progress Notes (Signed)
[facility] Progress Note  2 Days Post-Op  Subjective: CC-  Hypotensive and tachycardic. On vasopressin and norepinephrine.  Pressors and vent weaning some.    Objective: Vital signs in last 24 hours: Temp:  [100.8 F (38.2 C)-102.4 F (39.1 C)] 102.4 F (39.1 C) (11/24 0915) Pulse Rate:  [72-132] 116 (11/24 0915) Resp:  [12-38] 12 (11/24 0915) BP: (53-162)/(20-109) 97/40 (11/24 0915) SpO2:  [93 %-100 %] 99 % (11/24 0755) FiO2 (%):  [30 %-40 %] 30 % (11/24 0755) Weight:  [63.2 kg] 63.2 kg (11/24 0500) Last BM Date: 02/03/19  Intake/Output from previous day: 11/23 0701 - 11/24 0700 In: 5669.7 [I.V.:5085.6; IV Piggyback:584.1] Out: 3510 [Urine:240; Emesis/NG output:1300; Drains:1970] Intake/Output this shift: Total I/O In: 75 [I.V.:75] Out: 175 [Drains:175]  PE: Gen:  Sedated on the vent HEENT: ETT in place Card: tachycardic Pulm:  mechanically ventilated Abd: Soft, mild distension, dressing clean, JP with bilious drainage in bulb Ext:  S/p bilateral BKA Neuro: not following commands  Lab Results:  Recent Labs    02/03/19 2235 02/04/19 0120  WBC 6.4 5.9  HGB 12.7 11.8*  HCT 39.3 38.4  PLT PLATELET CLUMPS NOTED ON SMEAR, UNABLE TO ESTIMATE 67*   BMET Recent Labs    02/04/19 0120 02/04/19 0256  NA 131* 133*  K 5.7* 5.7*  CL 105 104  CO2 14* 15*  GLUCOSE 176* 209*  BUN 42* 41*  CREATININE 2.22* 2.19*  CALCIUM 7.1* 7.2*   PT/INR Recent Labs    02/02/19 1832 02/02/19 1952  LABPROT 16.0* 15.2  INR 1.3* 1.2   CMP     Component Value Date/Time   NA 133 (L) 02/04/2019 0256   NA 139 10/04/2017 1705   K 5.7 (H) 02/04/2019 0256   CL 104 02/04/2019 0256   CO2 15 (L) 02/04/2019 0256   GLUCOSE 209 (H) 02/04/2019 0256   BUN 41 (H) 02/04/2019 0256   BUN 11 10/04/2017 1705   CREATININE 2.19 (H) 02/04/2019 0256   CREATININE 0.68 04/28/2016 1640   CALCIUM 7.2 (L) 02/04/2019 0256   PROT 6.3 (L) 02/02/2019 1832   PROT 7.1 10/04/2017 1705    ALBUMIN 2.4 (L) 02/02/2019 1832   ALBUMIN 3.7 10/04/2017 1705   AST 30 02/02/2019 1832   ALT 22 02/02/2019 1832   ALKPHOS 71 02/02/2019 1832   BILITOT 0.8 02/02/2019 1832   BILITOT <0.2 10/04/2017 1705   GFRNONAA 24 (L) 02/04/2019 0256   GFRNONAA >89 04/28/2016 1640   GFRAA 28 (L) 02/04/2019 0256   GFRAA >89 04/28/2016 1640   Lipase     Component Value Date/Time   LIPASE 27 04/01/2016 1040       Studies/Results: Ct Abdomen Pelvis Wo Contrast  Result Date: 02/02/2019 CLINICAL DATA:  Abdominal pain. EXAM: CT ABDOMEN AND PELVIS WITHOUT CONTRAST TECHNIQUE: Multidetector CT imaging of the abdomen and pelvis was performed following the standard protocol without IV contrast. COMPARISON:  06/15/2013 FINDINGS: Lower chest: There is mild airspace disease at the lung bases bilaterally.The heart size is normal. The intracardiac blood pool is hypodense relative to the adjacent myocardium consistent with anemia. Hepatobiliary: The liver is normal. Cholelithiasis without acute inflammation.There is no biliary ductal dilation. Pancreas: Normal contours without ductal dilatation. No peripancreatic fluid collection. Spleen: No splenic laceration or hematoma. Adrenals/Urinary Tract: --Adrenal glands: No adrenal hemorrhage. --Right kidney/ureter: No hydronephrosis or perinephric hematoma. --Left kidney/ureter: No hydronephrosis or perinephric hematoma. --Urinary bladder: The bladder is decompressed with a Foley catheter which limits evaluation. Stomach/Bowel: --Stomach/Duodenum: There  is some wall thickening of the distal esophagus. The stomach is mildly distended. --Small bowel: There is pneumatosis involving multiple small bowel loops. The small bowel is mildly dilated without evidence for a high-grade obstruction. --Colon: No focal abnormality. --Appendix: Normal. Vascular/Lymphatic: There are minimal atherosclerotic changes of the abdominal aorta. Gas is noted within the SMV and draining mesenteric veins.  --there are some prominent retroperitoneal lymph nodes that are suboptimally evaluated in the absence of IV contrast. --No mesenteric lymphadenopathy. --No pelvic or inguinal lymphadenopathy. Reproductive: Unremarkable Other: There is a large amount of free air within the abdomen. There is a moderate volume of somewhat complex free fluid in the abdomen. There is mild body wall edema. Musculoskeletal. No acute displaced fractures. IMPRESSION: 1. Findings consistent with a hollow viscus perforation as evidence by a large volume of free air and a moderate volume a complex free fluid throughout the abdomen. 2. 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 Gas is noted within the SMV and draining mesenteric veins. Findings are concerning for mesenteric ischemia 3. There is cholelithiasis without secondary signs of acute cholecystitis. 4. Bibasilar airspace disease may represent atelectasis, pneumonia, or aspiration in the appropriate clinical setting. These results were called by telephone at the time of interpretation on 02/02/2019 at 9:25 pm to provider Sherwood Gambler , who verbally acknowledged these results. Electronically Signed   By: Constance Holster M.D.   On: 02/02/2019 21:42   Ct Head Wo Contrast  Result Date: 02/02/2019 CLINICAL DATA:  Found on the floor at home.  Nausea and vomiting. EXAM: CT HEAD WITHOUT CONTRAST TECHNIQUE: Contiguous axial images were obtained from the base of the skull through the vertex without intravenous contrast. COMPARISON:  03/13/2018 FINDINGS: Brain: No evidence of acute infarction, hemorrhage, hydrocephalus, extra-axial collection or mass lesion/mass effect. Vascular: No hyperdense vessel or unexpected calcification. Skull: Normal. Negative for fracture or focal lesion. Sinuses/Orbits: Globes and orbits are unremarkable. Visualized sinuses and mastoid air cells are clear. Other: None. IMPRESSION: Normal enhanced CT scan of the brain. Electronically Signed    By: Lajean Manes M.D.   On: 02/02/2019 21:38   Dg Chest Port 1 View  Result Date: 02/04/2019 CLINICAL DATA:  Shortness of breath. EXAM: PORTABLE CHEST 1 VIEW COMPARISON:  One-view chest x-ray 02/03/2019 FINDINGS: The heart size is normal. Endotracheal tube is stable. NG tube terminates in the stomach. Right IJ line is stable. Aeration in lung volumes are slightly improved. Mild pulmonary vascular congestion remains. IMPRESSION: 1. Slight improved aeration with persistent mild pulmonary vascular congestion. 2. Support apparatus is stable. Electronically Signed   By: San Morelle M.D.   On: 02/04/2019 08:36   Dg Chest Port 1 View  Result Date: 02/03/2019 CLINICAL DATA:  ET and NG placement EXAM: PORTABLE CHEST 1 VIEW COMPARISON:  Radiograph 02/02/2019 FINDINGS: Endotracheal tube terminates in the mid trachea, 4.5 cm from the carina. Transesophageal tube tip and side port are distal to the GE junction curling in the left upper quadrant. Right IJ approach catheter sheath tip terminates at the superior cavoatrial junction. Lung volumes are low with some streaky atelectatic changes. Cardiomediastinal contours are unremarkable for portable technique. No acute osseous or soft tissue abnormality. IMPRESSION: 1. Endotracheal tube terminates in the mid trachea, 4.5 cm from the carina. 2. Transesophageal tube tip and side port are distal to the GE junction. 3. Right IJ catheter terminates at the superior cavoatrial junction. 4. Basilar atelectasis otherwise no acute cardiopulmonary abnormality. Electronically Signed   By: Elwin Sleight.D.  On: 02/03/2019 03:35   Dg Chest Port 1 View  Result Date: 02/02/2019 CLINICAL DATA:  Patient arrived by EMS from home. Pt was found on the floor at home. Pt has N/V. Patient is urinary and fecal incontinent. Hx of DM and HTN. EXAM: PORTABLE CHEST 1 VIEW COMPARISON:  04/01/2016 FINDINGS: Cardiac silhouette is normal in size. No mediastinal or hilar masses. Lung  volumes are low. Lungs are clear. No pleural effusion or pneumothorax. Skeletal structures are grossly intact. IMPRESSION: No active disease. Electronically Signed   By: Lajean Manes M.D.   On: 02/02/2019 19:56    Anti-infectives: Anti-infectives (From admission, onward)   Start     Dose/Rate Route Frequency Ordered Stop   02/05/19 1000  anidulafungin (ERAXIS) 100 mg in sodium chloride 0.9 % 100 mL IVPB     100 mg 78 mL/hr over 100 Minutes Intravenous Every 24 hours 02/04/19 0840     02/04/19 1000  anidulafungin (ERAXIS) 200 mg in sodium chloride 0.9 % 200 mL IVPB     200 mg 78 mL/hr over 200 Minutes Intravenous  Once 02/04/19 0840     02/04/19 0200  fluconazole (DIFLUCAN) IVPB 200 mg  Status:  Discontinued     200 mg 100 mL/hr over 60 Minutes Intravenous Every 24 hours 02/02/19 2302 02/04/19 0840   02/03/19 1800  vancomycin (VANCOCIN) IVPB 750 mg/150 ml premix  Status:  Discontinued     750 mg 150 mL/hr over 60 Minutes Intravenous Every 24 hours 02/03/19 0241 02/03/19 0242   02/03/19 1800  vancomycin (VANCOCIN) IVPB 750 mg/150 ml premix  Status:  Discontinued     750 mg 150 mL/hr over 60 Minutes Intravenous Every 24 hours 02/03/19 0242 02/03/19 0858   02/03/19 0600  piperacillin-tazobactam (ZOSYN) IVPB 3.375 g     3.375 g 12.5 mL/hr over 240 Minutes Intravenous Every 8 hours 02/03/19 0243     02/02/19 2315  fluconazole (DIFLUCAN) IVPB 400 mg     400 mg 100 mL/hr over 120 Minutes Intravenous STAT 02/02/19 2301 02/03/19 0218   02/02/19 1800  ceFEPIme (MAXIPIME) 2 g in sodium chloride 0.9 % 100 mL IVPB     2 g 200 mL/hr over 30 Minutes Intravenous  Once 02/02/19 1745 02/02/19 2030   02/02/19 1800  metroNIDAZOLE (FLAGYL) IVPB 500 mg     500 mg 100 mL/hr over 60 Minutes Intravenous  Once 02/02/19 1745 02/02/19 2237   02/02/19 1800  vancomycin (VANCOCIN) IVPB 1000 mg/200 mL premix  Status:  Discontinued     1,000 mg 200 mL/hr over 60 Minutes Intravenous  Once 02/02/19 1745 02/02/19  1747   02/02/19 1800  vancomycin (VANCOCIN) 1,250 mg in sodium chloride 0.9 % 250 mL IVPB     1,250 mg 166.7 mL/hr over 90 Minutes Intravenous  Once 02/02/19 1747 02/02/19 2237       Assessment/Plan Poorly controlled DM2 - A1c 15.2 S/p bilateral BKA HTN Schizophrenia VDRF DKA AKI, oliguria Leukopenia  Septic shock Perforated prepyloric ulcer, 3,100 cc of contaminated peritoneal fluid S/p EXPLORATORY LAPAROTOMY WITH OVERSEW OF DUODENAL ULCER with [name] patch 02/03/19 Dr. Lucia Gaskins - POD#1 - intraop culture pending - Blood cultures growing Candida Glabrata - BID wet to dry dressing changes to midline abdominal wound - continue NPO, NGT to LIWS - protonix BID - monitor JP drain output  ID - currently vancomycin/zosyn/diflucan 11/23>> FEN - NPO/NGT, prealbumin 6.3 VTE - SCDs, sq heparin Foley - in place Follow up - TBD    LOS: 2 days  Rosario Adie, MD  [facility]

## 2019-02-04 NOTE — Progress Notes (Signed)
PHARMACY - PHYSICIAN COMMUNICATION CRITICAL VALUE ALERT - BLOOD CULTURE IDENTIFICATION (BCID)  Gina [name] is an 58 y.o. female who presented to [facility] on 02/02/2019 with a chief complaint of altered mental status, found unresponsive at home.   Assessment:  Admit with septic shock secondary to peritonitis with perforated bowel.  She is post op day #1.  She remains intubated on pressors with AKI. Blood cx now + C glabrata, peritoneal cx also + yeast.  Urine cx + GNR.   Name of physician (or Provider) ContactedCherlynn Kaiser, NP  Current antibiotics:  Eraxis 200mg  IV x1 today then 100mg  daily Zosyn 3.375gm IV Q8h to be infused over 4hrs  Changes to prescribed antibiotics recommended:  Patient is on recommended antibiotics - No changes needed  Results for orders placed or performed during the hospital encounter of 02/02/19  Blood Culture ID Panel (Reflexed) (Collected: 02/02/2019  6:16 PM)  Result Value Ref Range   Enterococcus species NOT DETECTED NOT DETECTED   Listeria monocytogenes NOT DETECTED NOT DETECTED   Staphylococcus species NOT DETECTED NOT DETECTED   Staphylococcus aureus (BCID) NOT DETECTED NOT DETECTED   Streptococcus species NOT DETECTED NOT DETECTED   Streptococcus agalactiae NOT DETECTED NOT DETECTED   Streptococcus pneumoniae NOT DETECTED NOT DETECTED   Streptococcus pyogenes NOT DETECTED NOT DETECTED   Acinetobacter baumannii NOT DETECTED NOT DETECTED   Enterobacteriaceae species NOT DETECTED NOT DETECTED   Enterobacter cloacae complex NOT DETECTED NOT DETECTED   Escherichia coli NOT DETECTED NOT DETECTED   Klebsiella oxytoca NOT DETECTED NOT DETECTED   Klebsiella pneumoniae NOT DETECTED NOT DETECTED   Proteus species NOT DETECTED NOT DETECTED   Serratia marcescens NOT DETECTED NOT DETECTED   Haemophilus influenzae NOT DETECTED NOT DETECTED   Neisseria meningitidis NOT DETECTED NOT DETECTED   Pseudomonas aeruginosa NOT DETECTED NOT DETECTED   Candida  albicans NOT DETECTED NOT DETECTED   Candida glabrata DETECTED (A) NOT DETECTED   Candida krusei NOT DETECTED NOT DETECTED   Candida parapsilosis NOT DETECTED NOT DETECTED   Candida tropicalis NOT DETECTED NOT DETECTED    Netta Cedars, PharmD, BCPS 02/04/2019  8:50 AM

## 2019-02-04 NOTE — Progress Notes (Signed)
eLink Physician-Brief Progress Note Patient Name: Gina [name] DOB: Jan 19, 1961 MRN: [id number]   Date of Service  02/04/2019  HPI/Events of Note  Notified of lactic acid 7.3, oligoanuric. Decreasing pressor requirement. Ongoing bicarb drip at 150 cc/hr. D10 drip at 75 cc/hr  eICU Interventions  Ordered albumin 25 g x 1     Intervention Category Major Interventions: Other: Intermediate Interventions: Oliguria - evaluation and management  Judd Lien 02/04/2019, 2:33 AM

## 2019-02-04 NOTE — Progress Notes (Signed)
eLink Physician-Brief Progress Note Patient Name: Talecia Greunke DOB: [birth year] MRN: [id number]   Date of Service  02/04/2019  HPI/Events of Note  Hypocalcemia  eICU Interventions  Calcium gluconate 2 gm iv bolus x 1, ionized calcium check after iv bolus.        Kerry Kass Abiha Lukehart 02/04/2019, 9:14 PM

## 2019-02-04 NOTE — Progress Notes (Signed)
CRITICAL VALUE ALERT  Critical Value:  Lactic acid 7.3  Date & Time Notied:  02/04/19 0205  Provider Notified: e-link   Orders Received/Actions taken: waiting for further orders     E-link nurse also informed of decreased urine output, will relay to MD

## 2019-02-04 NOTE — Consult Note (Signed)
Date of Admission:  02/02/2019          Reason for Consult: Candidemia    Referring Provider: Terrilyn Saver auto consult   Assessment:  1. 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 Infected, contaminated peritoneal fluid 3. Hx of poorly controlled DM w bilateral BKA 4. Hx of recent third finger amputation in October 5. Schizophrenia  Plan:  1. Continue Eraxis 2. Continue Zosyn 3. Ultimately she will need a central line holiday given her candidemia 4. I have phoned ophthalmology to see if Dr. Baird Cancer can see the patient and perform a dilated funduscopic exam to evaluate for fungal endophthalmitis 5. Would recommend reimaging her during this stay to assure there are no intra-abdominal abscesses that have developed that need drainage  Active Problems:   Pressure injury of skin   Perforated gastric ulcer ([place])   Scheduled Meds: . chlorhexidine gluconate (MEDLINE KIT)  15 mL Mouth Rinse BID  . Chlorhexidine Gluconate Cloth  6 each Topical Daily  . mouth rinse  15 mL Mouth Rinse 10 times per day  . miconazole nitrate   Topical TID  . pantoprazole (PROTONIX) IV  40 mg Intravenous Q12H  . sodium chloride flush  10-40 mL Intracatheter Q12H   Continuous Infusions: . sodium chloride Stopped (02/03/19 1553)  . sodium chloride    . [START ON 02/05/2019] anidulafungin    . dextrose 20 mL/hr at 02/04/19 1130  . fentaNYL infusion INTRAVENOUS Stopped (02/04/19 0800)  . insulin 10 Units/hr (02/04/19 1209)  . norepinephrine (LEVOPHED) Adult infusion 27 mcg/min (02/04/19 0917)  . piperacillin-tazobactam (ZOSYN)  IV Stopped (02/04/19 0925)  .  sodium bicarbonate (isotonic) infusion in sterile water 125 mL/hr at 02/04/19 0700  . vasopressin (PITRESSIN) infusion - *FOR SHOCK* 0.03 Units/min (02/04/19 0700)   PRN Meds:.acetaminophen, albuterol, dextrose, fentaNYL, ondansetron (ZOFRAN) IV, sodium chloride flush  HPI: Gina [name] is a 58 y.o. female  with h/o poorly  controlled DM2 with diabetic foot wounds s/p b/l BKA, recent left phalanx amputation, HTN and schizophrenia who presented to the ED via EMS with altered mental status, nausea, vomiting and abdominal pain. In the ED, she was hypothermic and severely hypotensive. Initial blood glucose >1000.  CT abdomen showed pneumoperitoneum and mesenteric ischemia. She underwent exploratory Laparotomy with finding of perforated prepyloric ulcer and 3.1 L of contaminated peritoneal fluid. She has been in septic shock and now blood cultures are growing Candida glabrata.She was on cefepime, flagyl--> zosyn. Fluconazole started earlier today and now changed to Eraxis.  I have Dr. Baird Cancer office with ophthalmology to see if he can come and perform a dedicated funduscopic exam to evaluate for possible fungal endophthalmitis.   Review of Systems: Review of Systems  Unable to perform ROS: Critical illness    Past Medical History:  Diagnosis Date  . Diabetes mellitus   . Hypertension   . Osteomyelitis of right foot ([place]) 02/22/2017  . Schizophrenia ([place])   . Septic arthritis of interphalangeal joint of toe of left foot ([place]) 06/02/2016  . Stress incontinence 04/26/2017    Social History   Tobacco Use  . Smoking status: Never Smoker  . Smokeless tobacco: Never Used  Substance Use Topics  . Alcohol use: No    Comment: occasionally  . Drug use: No    Family History  Problem Relation Age of Onset  . Diabetes type II Father   . CAD Father   . Prostate cancer Father   . Diabetes Mellitus II Brother  Allergies  Allergen Reactions  . Bee Venom Swelling    Swells up with bee stings   . Metformin And Related Other (See Comments)    Upset stomach    OBJECTIVE: Blood pressure (!) 80/44, pulse (!) 125, temperature (!) 102.4 F (39.1 C), resp. rate 19, height 5' 2"  (1.575 m), weight 63.2 kg, last menstrual period 03/29/2015, SpO2 100 %.  Physical Exam Constitutional:      General: She is in acute  distress.     Interventions: She is intubated.  HENT:     Head: Normocephalic.  Cardiovascular:     Rate and Rhythm: Tachycardia present.     Heart sounds: Murmur present. No friction rub.  Pulmonary:     Effort: She is intubated.     Breath sounds: No wheezing.  Abdominal:     General: There is distension.     Tenderness: There is abdominal tenderness.  Musculoskeletal: Normal range of motion.     Comments: Bilateral bka  Neurological:     Mental Status: She is disoriented.    Left hand 02/04/2019:      Lab Results Lab Results  Component Value Date   WBC 5.9 02/04/2019   HGB 11.8 (L) 02/04/2019   HCT 38.4 02/04/2019   MCV 94.6 02/04/2019   PLT 67 (L) 02/04/2019    Lab Results  Component Value Date   CREATININE 2.27 (H) 02/04/2019   BUN 41 (H) 02/04/2019   NA 131 (L) 02/04/2019   K 5.5 (H) 02/04/2019   CL 101 02/04/2019   CO2 13 (L) 02/04/2019    Lab Results  Component Value Date   ALT 22 02/02/2019   AST 30 02/02/2019   ALKPHOS 71 02/02/2019   BILITOT 0.8 02/02/2019     Microbiology: Recent Results (from the past 240 hour(s))  Urine culture     Status: Abnormal   Collection Time: 02/02/19  5:44 PM   Specimen: In/Out Cath Urine  Result Value Ref Range Status   Specimen Description   Final    IN/OUT CATH URINE Performed at [facility], [place] [address], [place], [place] [address]    Special Requests   Final    NONE Performed at [facility], [place] [address] [place], [place] [address]    Culture >=100,000 COLONIES/mL KLEBSIELLA PNEUMONIAE (A)  Final   Report Status 02/04/2019 FINAL  Final   Organism ID, Bacteria KLEBSIELLA PNEUMONIAE (A)  Final      Susceptibility   Klebsiella pneumoniae - MIC*    AMPICILLIN >=32 RESISTANT Resistant     CEFAZOLIN <=4 SENSITIVE Sensitive     CEFTRIAXONE <=1 SENSITIVE Sensitive     CIPROFLOXACIN <=0.25 SENSITIVE Sensitive     GENTAMICIN <=1 SENSITIVE Sensitive     IMIPENEM  <=0.25 SENSITIVE Sensitive     NITROFURANTOIN 64 INTERMEDIATE Intermediate     TRIMETH/SULFA <=20 SENSITIVE Sensitive     AMPICILLIN/SULBACTAM 4 SENSITIVE Sensitive     PIP/TAZO <=4 SENSITIVE Sensitive     Extended ESBL NEGATIVE Sensitive     * >=100,000 COLONIES/mL KLEBSIELLA PNEUMONIAE  Blood Culture (routine x 2)     Status: Abnormal (Preliminary result)   Collection Time: 02/02/19  6:16 PM   Specimen: BLOOD  Result Value Ref Range Status   Specimen Description   Final    BLOOD LEFT ANTECUBITAL Performed at [place] [address] [place],  [address]    Special Requests   Final    BOTTLES  DRAWN AEROBIC AND ANAEROBIC Blood Culture adequate volume Performed at [place] [address] [place], [place] [address]    Culture  Setup Time (A)  Final    YEAST ANAEROBIC BOTTLE ONLY CRITICAL RESULT CALLED TO, READ BACK BY AND VERIFIED WITH: Christean Grief PHARMD, AT 9201 02/04/19 BY Rush Landmark Performed at [facility], [place] [address] [place], [place] [address]    Culture YEAST  Final   Report Status PENDING  Incomplete  Blood Culture ID Panel (Reflexed)     Status: Abnormal   Collection Time: 02/02/19  6:16 PM  Result Value Ref Range Status   Enterococcus species NOT DETECTED NOT DETECTED Final   Listeria monocytogenes NOT DETECTED NOT DETECTED Final   Staphylococcus species NOT DETECTED NOT DETECTED Final   Staphylococcus aureus (BCID) NOT DETECTED NOT DETECTED Final   Streptococcus species NOT DETECTED NOT DETECTED Final   Streptococcus agalactiae NOT DETECTED NOT DETECTED Final   Streptococcus pneumoniae NOT DETECTED NOT DETECTED Final   Streptococcus pyogenes NOT DETECTED NOT DETECTED Final   Acinetobacter baumannii NOT DETECTED NOT DETECTED Final   Enterobacteriaceae species NOT DETECTED NOT DETECTED Final   Enterobacter cloacae complex NOT DETECTED NOT DETECTED Final   Escherichia coli NOT DETECTED NOT DETECTED Final    Klebsiella oxytoca NOT DETECTED NOT DETECTED Final   Klebsiella pneumoniae NOT DETECTED NOT DETECTED Final   Proteus species NOT DETECTED NOT DETECTED Final   Serratia marcescens NOT DETECTED NOT DETECTED Final   Haemophilus influenzae NOT DETECTED NOT DETECTED Final   Neisseria meningitidis NOT DETECTED NOT DETECTED Final   Pseudomonas aeruginosa NOT DETECTED NOT DETECTED Final   Candida albicans NOT DETECTED NOT DETECTED Final   Candida glabrata DETECTED (A) NOT DETECTED Final    Comment: CRITICAL RESULT CALLED TO, READ BACK BY AND VERIFIED WITH: J. LEGGE PHARMD, AT 1975 02/04/19 BY D. VANHOOK    Candida krusei NOT DETECTED NOT DETECTED Final   Candida parapsilosis NOT DETECTED NOT DETECTED Final   Candida tropicalis NOT DETECTED NOT DETECTED Final    Comment: Performed at [facility], [address], [place], [place] [address]  SARS Coronavirus 2 by RT PCR (hospital order, performed in [facility] hospital lab) Nasopharyngeal Nasopharyngeal Swab     Status: None   Collection Time: 02/02/19  9:13 PM   Specimen: Nasopharyngeal Swab  Result Value Ref Range Status   SARS Coronavirus 2 NEGATIVE NEGATIVE Final    Comment: (NOTE) If result is NEGATIVE SARS-CoV-2 target nucleic acids are NOT DETECTED. The SARS-CoV-2 RNA is generally detectable in upper and lower  respiratory specimens during the acute phase of infection. The lowest  concentration of SARS-CoV-2 viral copies this assay can detect is 250  copies / mL. A negative result does not preclude SARS-CoV-2 infection  and should not be used as the sole basis for treatment or other  patient management decisions.  A negative result may occur with  improper specimen collection / handling, submission of specimen other  than nasopharyngeal swab, presence of viral mutation(s) within the  areas targeted by this assay, and inadequate number of viral copies  (<250 copies / mL). A negative result must be combined with clinical   observations, patient history, and epidemiological information. If result is POSITIVE SARS-CoV-2 target nucleic acids are DETECTED. The SARS-CoV-2 RNA is generally detectable in upper and lower  respiratory specimens dur ing the acute phase of infection.  Positive  results are indicative of active infection with  SARS-CoV-2.  Clinical  correlation with patient history and other diagnostic information is  necessary to determine patient infection status.  Positive results do  not rule out bacterial infection or co-infection with other viruses. If result is PRESUMPTIVE POSTIVE SARS-CoV-2 nucleic acids MAY BE PRESENT.   A presumptive positive result was obtained on the submitted specimen  and confirmed on repeat testing.  While 2019 novel coronavirus  (SARS-CoV-2) nucleic acids may be present in the submitted sample  additional confirmatory testing may be necessary for epidemiological  and / or clinical management purposes  to differentiate between  SARS-CoV-2 and other Sarbecovirus currently known to infect humans.  If clinically indicated additional testing with an alternate test  methodology [phone]) is advised. The SARS-CoV-2 RNA is generally  detectable in upper and lower respiratory sp ecimens during the acute  phase of infection. The expected result is Negative. Fact Sheet for Patients:  StrictlyIdeas.no Fact Sheet for Healthcare Providers: BankingDealers.co.za This test is not yet approved or cleared by the [place] FDA and has been authorized for detection and/or diagnosis of SARS-CoV-2 by FDA under an Emergency Use Authorization (EUA).  This EUA will remain in effect (meaning this test can be used) for the duration of the COVID-19 declaration under Section 564(b)(1) of the Act, 21 U.S.C. section 360bbb-3(b)(1), unless the authorization is terminated or revoked sooner. Performed at [facility], [place]  [address], [place], [place] [address]   Aerobic/Anaerobic Culture (surgical/deep wound)     Status: None (Preliminary result)   Collection Time: 02/03/19  2:25 AM   Specimen: Peritoneal; Body Fluid  Result Value Ref Range Status   Specimen Description   Final    PERITONEAL Performed at [place] [address], [place], [place] [address]    Special Requests   Final    NONE Performed at [facility], [place] [address], [place], [place] [address]    Gram Stain NO WBC SEEN RARE BUDDING YEAST SEEN   Final   Culture   Final    ABUNDANT YEAST CULTURE REINCUBATED FOR BETTER GROWTH Performed at [facility], [place] [address], [place], [place] [address]    Report Status PENDING  Incomplete  MRSA PCR Screening     Status: None   Collection Time: 02/03/19  2:53 AM   Specimen: Nasopharyngeal  Result Value Ref Range Status   MRSA by PCR NEGATIVE NEGATIVE Final    Comment:        The GeneXpert MRSA Assay (FDA approved for NASAL specimens only), is one component of a comprehensive MRSA colonization surveillance program. It is not intended to diagnose MRSA infection nor to guide or monitor treatment for MRSA infections. Performed at [facility], [place] [address], [place], [place] [address]     Alcide Evener, [facility] [phone] pager  02/04/2019, 1:19 PM

## 2019-02-04 NOTE — Progress Notes (Signed)
Endo tool d/c'd and D5 turned down to 50 cc/hr per E-Link MD [place] .

## 2019-02-04 NOTE — Progress Notes (Signed)
ETT advanced 2cm from 21 to 23cm per CCM order.

## 2019-02-04 NOTE — Progress Notes (Signed)
eLink Physician-Brief Progress Note Patient Name: Gina [name] DOB: May 13, 1960 MRN: [id number]   Date of Service  02/04/2019  HPI/Events of Note  Continue to hold insulin drip as patient requiring D10 at 75 cc/hr  eICU Interventions  Discussed with bedside RN if glucose remains > 100 for 2 consecutive hours then may titrate down D10 to 50 cc/hr     Intervention Category Major Interventions: Hyperglycemia - active titration of insulin therapy  Shona Needles Kathlynn Swofford 02/04/2019, 1:50 AM

## 2019-02-04 NOTE — Progress Notes (Signed)
eLink Physician-Brief Progress Note Patient Name: Gina [name] DOB: 1961-02-13 MRN: [id number]   Date of Service  02/04/2019  HPI/Events of Note  Pt needs a midline for blood drawing access  eICU Interventions  Midline order entered.     Intervention Category Intermediate Interventions: Other:  Frederik Pear 02/04/2019, 11:40 PM

## 2019-02-04 NOTE — Progress Notes (Signed)
Informed e-link MD of low blood sugars, new order of D10 was hung and the newest blood sugar was 103. Endotool prompted the insulin drip to be put back on at 0.8 units/hour. Informed e-link and the nurse advised [place] to turn the insulin drip off. E-link nurse will inform doctor. Waiting for further orders. Will continue to monitor.   Pt's temperatures are continuing to rise, it is now at 102.2. Rectal tylenol has been ineffective. E-link MD aware and ordered labs. Waiting on further orders.

## 2019-02-04 NOTE — Progress Notes (Signed)
eLink Physician-Brief Progress Note Patient Name: Gina [name] DOB: 1960/03/15 MRN: [id number]   Date of Service  02/04/2019  HPI/Events of Note  On the ventilator and reaching for her ET tube.  eICU Interventions  Bilateral soft restraints ordered for Pt safety and to prevent self-extubation.        Kerry Kass Kemesha Mosey 02/04/2019, 9:36 PM

## 2019-02-04 NOTE — Progress Notes (Addendum)
CRITICAL VALUE ALERT  Critical Value:  Calcium 6.4  Date & Time Notied:  02/04/19 2030  Provider Notified: e-link  Orders Received/Actions taken: MD aware

## 2019-02-05 DIAGNOSIS — E1165 Type 2 diabetes mellitus with hyperglycemia: Secondary | ICD-10-CM

## 2019-02-05 DIAGNOSIS — K255 Chronic or unspecified gastric ulcer with perforation: Secondary | ICD-10-CM

## 2019-02-05 DIAGNOSIS — B377 Candidal sepsis: Principal | ICD-10-CM

## 2019-02-05 DIAGNOSIS — N179 Acute kidney failure, unspecified: Secondary | ICD-10-CM | POA: Diagnosis not present

## 2019-02-05 DIAGNOSIS — E1100 Type 2 diabetes mellitus with hyperosmolarity without nonketotic hyperglycemic-hyperosmolar coma (NKHHC): Secondary | ICD-10-CM

## 2019-02-05 DIAGNOSIS — E118 Type 2 diabetes mellitus with unspecified complications: Secondary | ICD-10-CM | POA: Diagnosis not present

## 2019-02-05 DIAGNOSIS — J9600 Acute respiratory failure, unspecified whether with hypoxia or hypercapnia: Secondary | ICD-10-CM

## 2019-02-05 DIAGNOSIS — I10 Essential (primary) hypertension: Secondary | ICD-10-CM | POA: Diagnosis not present

## 2019-02-05 DIAGNOSIS — A419 Sepsis, unspecified organism: Secondary | ICD-10-CM | POA: Diagnosis not present

## 2019-02-05 LAB — BASIC METABOLIC PANEL
Anion gap: 14 (ref 5–15)
Anion gap: 14 (ref 5–15)
Anion gap: 14 (ref 5–15)
Anion gap: 15 (ref 5–15)
BUN: 45 mg/dL — ABNORMAL HIGH (ref 6–20)
BUN: 48 mg/dL — ABNORMAL HIGH (ref 6–20)
BUN: 48 mg/dL — ABNORMAL HIGH (ref 6–20)
BUN: 47 mg/dL — ABNORMAL HIGH (ref 6–20)
CO2: 22 mmol/L (ref 22–32)
CO2: 26 mmol/L (ref 22–32)
CO2: 25 mmol/L (ref 22–32)
CO2: 26 mmol/L (ref 22–32)
Calcium: 6.7 mg/dL — ABNORMAL LOW (ref 8.9–10.3)
Calcium: 6.5 mg/dL — ABNORMAL LOW (ref 8.9–10.3)
Calcium: 6.4 mg/dL — CL (ref 8.9–10.3)
Calcium: 6.6 mg/dL — ABNORMAL LOW (ref 8.9–10.3)
Chloride: 95 mmol/L — ABNORMAL LOW (ref 98–111)
Chloride: 91 mmol/L — ABNORMAL LOW (ref 98–111)
Chloride: 91 mmol/L — ABNORMAL LOW (ref 98–111)
Chloride: 90 mmol/L — ABNORMAL LOW (ref 98–111)
Creatinine, Ser: 2.63 mg/dL — ABNORMAL HIGH (ref 0.44–1.00)
Creatinine, Ser: 2.61 mg/dL — ABNORMAL HIGH (ref 0.44–1.00)
Creatinine, Ser: 2.65 mg/dL — ABNORMAL HIGH (ref 0.44–1.00)
Creatinine, Ser: 2.59 mg/dL — ABNORMAL HIGH (ref 0.44–1.00)
GFR calc Af Amer: 22 mL/min — ABNORMAL LOW (ref >60)
GFR calc Af Amer: 23 mL/min — ABNORMAL LOW (ref >60)
GFR calc Af Amer: 22 mL/min — ABNORMAL LOW (ref >60)
GFR calc Af Amer: 23 mL/min — ABNORMAL LOW (ref >60)
GFR calc non Af Amer: 19 mL/min — ABNORMAL LOW (ref >60)
GFR calc non Af Amer: 20 mL/min — ABNORMAL LOW (ref >60)
GFR calc non Af Amer: 19 mL/min — ABNORMAL LOW (ref >60)
GFR calc non Af Amer: 20 mL/min — ABNORMAL LOW (ref >60)
Glucose, Bld: 226 mg/dL — ABNORMAL HIGH (ref 70–99)
Glucose, Bld: 212 mg/dL — ABNORMAL HIGH (ref 70–99)
Glucose, Bld: 163 mg/dL — ABNORMAL HIGH (ref 70–99)
Glucose, Bld: 141 mg/dL — ABNORMAL HIGH (ref 70–99)
Potassium: 4.1 mmol/L (ref 3.5–5.1)
Potassium: 3.9 mmol/L (ref 3.5–5.1)
Potassium: 3.5 mmol/L (ref 3.5–5.1)
Potassium: 3.6 mmol/L (ref 3.5–5.1)
Sodium: 131 mmol/L — ABNORMAL LOW (ref 135–145)
Sodium: 131 mmol/L — ABNORMAL LOW (ref 135–145)
Sodium: 130 mmol/L — ABNORMAL LOW (ref 135–145)
Sodium: 131 mmol/L — ABNORMAL LOW (ref 135–145)

## 2019-02-05 LAB — MAGNESIUM: Magnesium: 1.4 mg/dL — ABNORMAL LOW (ref 1.7–2.4)

## 2019-02-05 LAB — CBC
HCT: 25.1 % — ABNORMAL LOW (ref 36.0–46.0)
Hemoglobin: 8.5 g/dL — ABNORMAL LOW (ref 12.0–15.0)
MCH: 29.6 pg (ref 26.0–34.0)
MCHC: 33.9 g/dL (ref 30.0–36.0)
MCV: 87.5 fL (ref 80.0–100.0)
Platelets: 46 10*3/uL — ABNORMAL LOW (ref 150–400)
RBC: 2.87 MIL/uL — ABNORMAL LOW (ref 3.87–5.11)
RDW: 13.4 % (ref 11.5–15.5)
WBC: 5.5 10*3/uL (ref 4.0–10.5)
nRBC: 0 % (ref 0.0–0.2)

## 2019-02-05 LAB — BLOOD GAS, ARTERIAL
Acid-Base Excess: 3.8 mmol/L — ABNORMAL HIGH (ref 0.0–2.0)
Bicarbonate: 26 mmol/L (ref 20.0–28.0)
FIO2: 30
Mode: 400
O2 Saturation: 99.4 %
PEEP: 5 cmH2O
Patient temperature: 98.6
RATE: 25 resp/min
pCO2 arterial: 30 mmHg — ABNORMAL LOW (ref 32.0–48.0)
pH, Arterial: 7.546 — ABNORMAL HIGH (ref 7.350–7.450)
pO2, Arterial: 134 mmHg — ABNORMAL HIGH (ref 83.0–108.0)

## 2019-02-05 LAB — GLUCOSE, CAPILLARY
Glucose-Capillary: 118 mg/dL — ABNORMAL HIGH (ref 70–99)
Glucose-Capillary: 121 mg/dL — ABNORMAL HIGH (ref 70–99)
Glucose-Capillary: 126 mg/dL — ABNORMAL HIGH (ref 70–99)
Glucose-Capillary: 129 mg/dL — ABNORMAL HIGH (ref 70–99)
Glucose-Capillary: 143 mg/dL — ABNORMAL HIGH (ref 70–99)
Glucose-Capillary: 160 mg/dL — ABNORMAL HIGH (ref 70–99)
Glucose-Capillary: 172 mg/dL — ABNORMAL HIGH (ref 70–99)
Glucose-Capillary: 184 mg/dL — ABNORMAL HIGH (ref 70–99)
Glucose-Capillary: 187 mg/dL — ABNORMAL HIGH (ref 70–99)
Glucose-Capillary: 191 mg/dL — ABNORMAL HIGH (ref 70–99)
Glucose-Capillary: 196 mg/dL — ABNORMAL HIGH (ref 70–99)
Glucose-Capillary: 207 mg/dL — ABNORMAL HIGH (ref 70–99)
Glucose-Capillary: 217 mg/dL — ABNORMAL HIGH (ref 70–99)
Glucose-Capillary: 221 mg/dL — ABNORMAL HIGH (ref 70–99)
Glucose-Capillary: 238 mg/dL — ABNORMAL HIGH (ref 70–99)
Glucose-Capillary: 491 mg/dL — ABNORMAL HIGH (ref 70–99)
Glucose-Capillary: 80 mg/dL (ref 70–99)
Glucose-Capillary: 95 mg/dL (ref 70–99)
Glucose-Capillary: 96 mg/dL (ref 70–99)

## 2019-02-05 LAB — HEPATITIS A ANTIBODY, TOTAL: hep A Total Ab: NONREACTIVE

## 2019-02-05 LAB — H. PYLORI ANTIBODY, IGG: H Pylori IgG: 2.1 Index Value — ABNORMAL HIGH (ref 0.00–0.79)

## 2019-02-05 LAB — BETA-HYDROXYBUTYRIC ACID: Beta-Hydroxybutyric Acid: 0.07 mmol/L (ref 0.05–0.27)

## 2019-02-05 LAB — HEPATITIS B SURFACE ANTIGEN: Hepatitis B Surface Ag: NONREACTIVE

## 2019-02-05 LAB — PHOSPHORUS: Phosphorus: 5.6 mg/dL — ABNORMAL HIGH (ref 2.5–4.6)

## 2019-02-05 LAB — HIV ANTIBODY (ROUTINE TESTING W REFLEX): HIV Screen 4th Generation wRfx: NONREACTIVE

## 2019-02-05 MED ORDER — ALBUMIN HUMAN 5 % IV SOLN
25.0000 g | Freq: Once | INTRAVENOUS | Status: AC
  Administered 2019-02-05: 25 g via INTRAVENOUS
  Filled 2019-02-05: qty 500

## 2019-02-05 MED ORDER — INSULIN ASPART 100 UNIT/ML ~~LOC~~ SOLN
2.0000 [IU] | SUBCUTANEOUS | Status: DC
  Administered 2019-02-05 (×2): 2 [IU] via SUBCUTANEOUS
  Administered 2019-02-06 (×2): 4 [IU] via SUBCUTANEOUS

## 2019-02-05 MED ORDER — MAGNESIUM SULFATE 2 GM/50ML IV SOLN
2.0000 g | Freq: Once | INTRAVENOUS | Status: AC
  Administered 2019-02-05: 2 g via INTRAVENOUS
  Filled 2019-02-05: qty 50

## 2019-02-05 MED ORDER — FENTANYL CITRATE (PF) 100 MCG/2ML IJ SOLN
25.0000 ug | INTRAMUSCULAR | Status: DC | PRN
  Administered 2019-02-05 – 2019-02-06 (×2): 100 ug via INTRAVENOUS
  Administered 2019-02-06: 50 ug via INTRAVENOUS
  Administered 2019-02-07: 25 ug via INTRAVENOUS
  Administered 2019-02-07 – 2019-02-09 (×4): 50 ug via INTRAVENOUS
  Filled 2019-02-05 (×8): qty 2

## 2019-02-05 MED ORDER — INSULIN DETEMIR 100 UNIT/ML ~~LOC~~ SOLN
10.0000 [IU] | Freq: Every day | SUBCUTANEOUS | Status: DC
  Administered 2019-02-05: 10 [IU] via SUBCUTANEOUS
  Filled 2019-02-05 (×2): qty 0.1

## 2019-02-05 MED ORDER — GERHARDT'S BUTT CREAM
TOPICAL_CREAM | Freq: Three times a day (TID) | CUTANEOUS | Status: DC
  Administered 2019-02-05 – 2019-02-06 (×3): via TOPICAL
  Administered 2019-02-06: 1 via TOPICAL
  Administered 2019-02-06 – 2019-02-08 (×5): via TOPICAL
  Administered 2019-02-08: 1 via TOPICAL
  Administered 2019-02-08: 09:00:00 via TOPICAL
  Administered 2019-02-09 (×2): 1 via TOPICAL
  Administered 2019-02-09 – 2019-02-10 (×3): via TOPICAL
  Administered 2019-02-10: 1 via TOPICAL
  Administered 2019-02-11 – 2019-02-12 (×5): via TOPICAL
  Administered 2019-02-12: 1 via TOPICAL
  Administered 2019-02-13 – 2019-02-15 (×8): via TOPICAL
  Administered 2019-02-16: 1 via TOPICAL
  Administered 2019-02-16 – 2019-02-19 (×9): via TOPICAL
  Administered 2019-02-19 (×2): 1 via TOPICAL
  Administered 2019-02-20: 10:00:00 via TOPICAL
  Administered 2019-02-20: 1 via TOPICAL
  Administered 2019-02-20 – 2019-02-21 (×2): via TOPICAL
  Administered 2019-02-21: 1 via TOPICAL
  Administered 2019-02-21: 15:00:00 via TOPICAL
  Administered 2019-02-22 (×2): 1 via TOPICAL
  Administered 2019-02-22 – 2019-02-23 (×2): via TOPICAL
  Administered 2019-02-23: 1 via TOPICAL
  Administered 2019-02-23 – 2019-02-24 (×3): via TOPICAL
  Administered 2019-03-01 – 2019-03-06 (×5): 1 via TOPICAL
  Filled 2019-02-05 (×5): qty 1

## 2019-02-05 NOTE — Progress Notes (Signed)
Endotool would not give information of how much levemir to transfer pt. Off because they were previously transferred off the day before and it was still in their history. RN made MD aware he ordered levemir 10 units. RN gave levemir at 1310 and d/c'd insulin drip at 1510. Pt. Is now on a sliding scale insulin.

## 2019-02-05 NOTE — Progress Notes (Signed)
RN found large red patches on right lower part of leg/thigh and large blisters at the knee area. RN also found a few red spots on the left leg but not nearly as large as the right leg. MD and Charge nurse both believe that this is a possible skin reaction of the Vasopressors administered IV through central line to patient to maintain blood pressure. RN will continue to monitor closely and try to wean patients off of pressors.

## 2019-02-05 NOTE — Progress Notes (Addendum)
NAME:  Gina [name], MRN:  [id number], DOB:  April 09, 1960, LOS: 3 ADMISSION DATE:  02/02/2019, CONSULTATION DATE:  02/05/19  REFERRING MD:  Sherwood Gambler, CHIEF COMPLAINT:  Abdominal pain    History of present illness   Gina [name] is a 58 y.o. 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 In the ED, she was hypothermic and severely hypotensive. Initial blood glucose >1000.  CT abdomen showed pneumoperitoneum and mesenteric ischemia. She underwent exploratory Laparotomy with finding of perforated prepyloric ulcer and 3.1 L of contaminated peritoneal fluid  Past Medical History  Poorly controlled DM2 with diabetic foot wounds s/p b/l BKA Recent left phalanx amputation HTN Schizophrenia   Consults:  General Surgery (Dr. Lucia Gaskins)  Procedures:  11/23 >> ex lap with oversewing of duodenal ulcer with [name] patch , pyloric channel ulcer perforation with 3.1 L of contaminated peritoneal fluid, no evidence of small bowel ischemia  11/24 >> Ophtho eval negative for endophthalmitis   LDA RIJ 11/22 >> Lt radial a line 11/22 >>11/23 RT JP 11/22 >>  Significant Diagnostic Tests:  CT a/p without contrast 11/22 >> findings consistent with hollow viscus perforation and extensive pneumatosis involving multiple loops of small bowel concerning for mesenteric ischemia. The scan also noted cholelithiasis without cholecystitis and bibasilar airspace disease.  CT head 11/22 neg  Micro Data:  Blood 11/22 >> Candida glabrata urine  11/22 >> klebsiella BC 11/24 >>  Antimicrobials:  Cefepime 11/22 (1 dose) Metronidazole 11/22 (1 dose) Vancomycin 11/22 >>11/23 Fluconazole 11/22 >>11/24 Zosyn 11/23 >> Anidulafungin 11/24 >>   SUBJ - She is critically ill, intubated, on low-dose of Levophed and  vasopressin No drip turned off this morning. Remains on insulin drip low-dose with D10 Urine output poor but improving this morning 200 cc/last 2 hours   Objective   Blood pressure 134/68, pulse 94, temperature 99.7 F (37.6 C), temperature source Bladder, resp. rate (!) 25, height 5\' 2"  (1.575 m), weight 66.3 kg, last menstrual period 03/29/2015, SpO2 100 %. CVP:  [6 mmHg-8 mmHg] 6 mmHg  Vent Mode: PRVC FiO2 (%):  [30 %] 30 % Set Rate:  [25 bmp] 25 bmp Vt Set:  [400 mL] 400 mL PEEP:  [5 cmH20] 5 cmH20 Plateau Pressure:  [14 cmH20-19 cmH20] 17 cmH20   Intake/Output Summary (Last 24 hours) at 02/05/2019 0956 Last data filed at 02/05/2019 [id number] Gross per 24 hour  Intake 5459.96 ml  Output 2845 ml  Net 2614.96 ml   Filed Weights   02/03/19 0500 02/04/19 0500 02/05/19 0423  Weight: 62 kg 63.2 kg 66.3 kg    Examination: General: Acutely ill-appearing, unresponsive, intubated and sedated HENT: PERRLA, very dry OMM Lungs: Decreased breath sounds bilateral, no rhonchi Cardiovascular: tachycardic, no m/r/g Abdomen: Distended, soft, JP drain -about 1.5 L of thin dark brown liquid last 24 hours Extremities: s/p b/l BKA, no edema , stumps appear ischemic, dark purple Neuro: Off fentanyl, eyes open to name, does not follow commands  Chest x-ray 11/24 personally reviewed which shows ET tube slight high, improved aeration bibasal    Assessment & Plan:   [place] septic shock due to gastric ulcer perforation and peritonitis with DKA -With Candida glabrata fungemia  Septic shock 2/2 peritonitis, perforated viscus Fungemia -Candida glabrata Klebsiella UTI Ischemic bowel suggested by imaging but no evidence Intra-Op --Contine  Zosyn &  anidulafungin -Levophed and vasopressin as needed for MAP 65 , will accept SBP 100 as long as UO ok-expect slow taper once fungemia adequately treated -Keep NPO -Surgical management of drains -JP is putting out large amounts but hemoglobin stable,  surgical team thinks possible leak -Will need CT abdomen/pelvis if develops fever/leukocytosis  Acute respiratory failure -tolerates pressure support wean but will wait for hemodynamic improvement before pursuing extubation  DKA -anion gap initially resolved but now back up to 17 and beta hydroxy butyrate slight high Hypoglycemia 11/23  - beta hydroxybutyrate normalized, can discontinue insulin drip and transition to subcu Levemir -Continue D10 to avoid hypoglycemia   AKI -prerenal versus ATN , urine output starting to improve -Baseline creatinine ~0.50 -dc bicarb, monitor BMP every 6 hours -Strict I/O, insert and maintain Foley -Avoid nephrotoxins -Renally dose meds for CrCl <50 -Hypomagnesemia will be repleted   Acute encephalopathy- intermittent fentanyl for goal RASS -1 Hold gabapentin and Lexapro as of now  Protein -calorie malnutrition-if she needs TNA, will need new CVL/ PICC Await clearance of yeast from blood cultures before placing  Thrombocytopenia-related to overwhelming sepsis vs meds, doubt HITT DC'd subcu heparin 11/24  recent left phalanx amputation (10/17/ 2020) -surgical team following to see when sutures can be removed Minimize pressors to avoid ischemia of limbs  Resolved problems- Neutropenia-transient due to  overwhelming sepsis  Summary-persistent lactate &  pressors due to fungemia, bowel ischemia may be a concern but none found Intra-Op. Can transition off insulin drip now that beta hydroxybutyrate resolved   Best practice:  Diet: NPO Pain/Anxiety/Delirium protocol (if indicated): Fent VAP protocol (if indicated): Chlorhexidine DVT prophylaxis: SQH GI prophylaxis: PPI Glucose control: Insulin infusion Mobility: Bed rest Code Status: Full Family Communication: son & Daughter, [name] [phone]) Disposition: ICU   The patient is critically ill with multiple organ systems failure and requires high complexity decision making for assessment  and support, frequent evaluation and titration of therapies, application of advanced monitoring technologies and extensive interpretation of multiple databases. Critical Care Time devoted to patient care services described in this note independent of APP/resident  time is 35 minutes.        Kara Mead MD. Shade Flood. [facility]  If no response to pager , please call [phone]   02/05/2019

## 2019-02-05 NOTE — Progress Notes (Signed)
Subjective: On the ventilator  Antibiotics:  Anti-infectives (From admission, onward)   Start     Dose/Rate Route Frequency Ordered Stop   02/05/19 1000  anidulafungin (ERAXIS) 100 mg in sodium chloride 0.9 % 100 mL IVPB     100 mg 78 mL/hr over 100 Minutes Intravenous Every 24 hours 02/04/19 0840     02/04/19 1000  anidulafungin (ERAXIS) 200 mg in sodium chloride 0.9 % 200 mL IVPB     200 mg 78 mL/hr over 200 Minutes Intravenous  Once 02/04/19 0840 02/04/19 1301   02/04/19 0200  fluconazole (DIFLUCAN) IVPB 200 mg  Status:  Discontinued     200 mg 100 mL/hr over 60 Minutes Intravenous Every 24 hours 02/02/19 2302 02/04/19 0840   02/03/19 1800  vancomycin (VANCOCIN) IVPB 750 mg/150 ml premix  Status:  Discontinued     750 mg 150 mL/hr over 60 Minutes Intravenous Every 24 hours 02/03/19 0241 02/03/19 0242   02/03/19 1800  vancomycin (VANCOCIN) IVPB 750 mg/150 ml premix  Status:  Discontinued     750 mg 150 mL/hr over 60 Minutes Intravenous Every 24 hours 02/03/19 0242 02/03/19 0858   02/03/19 0600  piperacillin-tazobactam (ZOSYN) IVPB 3.375 g     3.375 g 12.5 mL/hr over 240 Minutes Intravenous Every 8 hours 02/03/19 0243     02/02/19 2315  fluconazole (DIFLUCAN) IVPB 400 mg     400 mg 100 mL/hr over 120 Minutes Intravenous STAT 02/02/19 2301 02/03/19 0218   02/02/19 1800  ceFEPIme (MAXIPIME) 2 g in sodium chloride 0.9 % 100 mL IVPB     2 g 200 mL/hr over 30 Minutes Intravenous  Once 02/02/19 1745 02/02/19 2030   02/02/19 1800  metroNIDAZOLE (FLAGYL) IVPB 500 mg     500 mg 100 mL/hr over 60 Minutes Intravenous  Once 02/02/19 1745 02/02/19 2237   02/02/19 1800  vancomycin (VANCOCIN) IVPB 1000 mg/200 mL premix  Status:  Discontinued     1,000 mg 200 mL/hr over 60 Minutes Intravenous  Once 02/02/19 1745 02/02/19 1747   02/02/19 1800  vancomycin (VANCOCIN) 1,250 mg in sodium chloride 0.9 % 250 mL IVPB     1,250 mg 166.7 mL/hr over 90 Minutes Intravenous  Once 02/02/19  1747 02/02/19 2237      Medications: Scheduled Meds: . acetylcysteine      . chlorhexidine gluconate (MEDLINE KIT)  15 mL Mouth Rinse BID  . Chlorhexidine Gluconate Cloth  6 each Topical Daily  . mouth rinse  15 mL Mouth Rinse 10 times per day  . miconazole nitrate   Topical TID  . pantoprazole (PROTONIX) IV  40 mg Intravenous Q12H  . phenylephrine  1 drop Both Eyes UD  . sodium chloride flush  10-40 mL Intracatheter Q12H  . tropicamide  1 drop Both Eyes UD   Continuous Infusions: . sodium chloride Stopped (02/03/19 1553)  . sodium chloride    . anidulafungin    . dextrose 50 mL/hr at 02/05/19 0743  . fentaNYL infusion INTRAVENOUS Stopped (02/05/19 0523)  . insulin 0.4 Units/hr (02/05/19 0817)  . norepinephrine (LEVOPHED) Adult infusion 15 mcg/min (02/05/19 0743)  . piperacillin-tazobactam (ZOSYN)  IV 12.5 mL/hr at 02/05/19 0743  .  sodium bicarbonate (isotonic) infusion in sterile water 125 mL/hr at 02/05/19 0743  . vasopressin (PITRESSIN) infusion - *FOR SHOCK* 0.03 Units/min (02/05/19 0743)   PRN Meds:.acetaminophen, albuterol, dextrose, fentaNYL, ondansetron (ZOFRAN) IV, sodium chloride flush    Objective: Weight change: 3.1 kg  Intake/Output Summary (Last 24 hours) at 02/05/2019 0853 Last data filed at 02/05/2019 0743 Gross per 24 hour  Intake 5488.91 ml  Output 2845 ml  Net 2643.91 ml   Blood pressure 134/68, pulse 95, temperature 99.7 F (37.6 C), temperature source Bladder, resp. rate (!) 25, height _0  (1.575 m), weight 66.3 kg, last menstrual period 03/29/2015, SpO2 100 %. Temp:  [98.1 F (36.7 C)-102.7 F (39.3 C)] 99.7 F (37.6 C) (11/25 0800) Pulse Rate:  [88-125] 95 (11/25 0800) Resp:  [12-27] 25 (11/25 0800) BP: (69-244)/(24-109) 134/68 (11/25 0800) SpO2:  [100 %] 100 % (11/25 0800) FiO2 (%):  [30 %] 30 % (11/25 0800) Weight:  [66.3 kg] 66.3 kg (11/25 0423)  Physical Exam: General: Intubated and sedated HEENT: anicteric sclera, EOMI CVS  tachycardic probable flow murmur Chest: , On ventilator coarse breath sounds Abdomen: Distended drain in place Extremities: Bilateral below the knee amputations left finger amputation site with sutures  skin: no rashes Neuro: nonfocal  CBC:    BMET Recent Labs    02/04/19 1947 02/05/19 0210  NA 131* 131*  K 4.4 4.1  CL 97* 95*  CO2 19* 22  GLUCOSE 274* 226*  BUN 46* 45*  CREATININE 2.72* 2.63*  CALCIUM 6.4* 6.7*     Liver Panel  Recent Labs    02/02/19 1832  PROT 6.3*  ALBUMIN 2.4*  AST 30  ALT 22  ALKPHOS 71  BILITOT 0.8       Sedimentation Rate No results for input(s): ESRSEDRATE in the last 72 hours. C-Reactive Protein No results for input(s): CRP in the last 72 hours.  Micro Results: Recent Results (from the past 720 hour(s))  Urine culture     Status: Abnormal   Collection Time: 02/02/19  5:44 PM   Specimen: In/Out Cath Urine  Result Value Ref Range Status   Specimen Description   Final    IN/OUT CATH URINE Performed at [facility] [address], [place], [place] [address]    Special Requests   Final    NONE Performed at [facility], [place] [address] [place], [place] [address]    Culture >=100,000 COLONIES/mL KLEBSIELLA PNEUMONIAE (A)  Final   Report Status 02/04/2019 FINAL  Final   Organism ID, Bacteria KLEBSIELLA PNEUMONIAE (A)  Final      Susceptibility   Klebsiella pneumoniae - MIC*    AMPICILLIN >=32 RESISTANT Resistant     CEFAZOLIN <=4 SENSITIVE Sensitive     CEFTRIAXONE <=1 SENSITIVE Sensitive     CIPROFLOXACIN <=0.25 SENSITIVE Sensitive     GENTAMICIN <=1 SENSITIVE Sensitive     IMIPENEM <=0.25 SENSITIVE Sensitive     NITROFURANTOIN 64 INTERMEDIATE Intermediate     TRIMETH/SULFA <=20 SENSITIVE Sensitive     AMPICILLIN/SULBACTAM 4 SENSITIVE Sensitive     PIP/TAZO <=4 SENSITIVE Sensitive     Extended ESBL NEGATIVE Sensitive     * >=100,000 COLONIES/mL KLEBSIELLA PNEUMONIAE  Blood  Culture (routine x 2)     Status: Abnormal (Preliminary result)   Collection Time: 02/02/19  6:16 PM   Specimen: BLOOD  Result Value Ref Range Status   Specimen Description   Final    BLOOD LEFT ANTECUBITAL Performed at [facility] [address], [place], [place] [address]    Special Requests   Final    BOTTLES DRAWN AEROBIC AND ANAEROBIC Blood Culture adequate volume Performed at [facility] [address] [place],  [address]  Culture  Setup Time (A)  Final    YEAST ANAEROBIC BOTTLE ONLY CRITICAL RESULT CALLED TO, READ BACK BY AND VERIFIED WITH: Christean Grief PHARMD, AT 2841 02/04/19 BY [place] Performed at [facility], [place] [address], [place], [place] [address]    Culture CANDIDA GLABRATA (A)  Final   Report Status PENDING  Incomplete  Blood Culture ID Panel (Reflexed)     Status: Abnormal   Collection Time: 02/02/19  6:16 PM  Result Value Ref Range Status   Enterococcus species NOT DETECTED NOT DETECTED Final   Listeria monocytogenes NOT DETECTED NOT DETECTED Final   Staphylococcus species NOT DETECTED NOT DETECTED Final   Staphylococcus aureus (BCID) NOT DETECTED NOT DETECTED Final   Streptococcus species NOT DETECTED NOT DETECTED Final   Streptococcus agalactiae NOT DETECTED NOT DETECTED Final   Streptococcus pneumoniae NOT DETECTED NOT DETECTED Final   Streptococcus pyogenes NOT DETECTED NOT DETECTED Final   Acinetobacter baumannii NOT DETECTED NOT DETECTED Final   Enterobacteriaceae species NOT DETECTED NOT DETECTED Final   Enterobacter cloacae complex NOT DETECTED NOT DETECTED Final   Escherichia coli NOT DETECTED NOT DETECTED Final   Klebsiella oxytoca NOT DETECTED NOT DETECTED Final   Klebsiella pneumoniae NOT DETECTED NOT DETECTED Final   Proteus species NOT DETECTED NOT DETECTED Final   Serratia marcescens NOT DETECTED NOT DETECTED Final   Haemophilus influenzae NOT DETECTED NOT DETECTED Final   Neisseria  meningitidis NOT DETECTED NOT DETECTED Final   Pseudomonas aeruginosa NOT DETECTED NOT DETECTED Final   Candida albicans NOT DETECTED NOT DETECTED Final   Candida glabrata DETECTED (A) NOT DETECTED Final    Comment: CRITICAL RESULT CALLED TO, READ BACK BY AND VERIFIED WITH: J. LEGGE PHARMD, AT 1027 02/04/19 BY D. VANHOOK    Candida krusei NOT DETECTED NOT DETECTED Final   Candida parapsilosis NOT DETECTED NOT DETECTED Final   Candida tropicalis NOT DETECTED NOT DETECTED Final    Comment: Performed at [facility], [address], [place], [place] [address]  SARS Coronavirus 2 by RT PCR (hospital order, performed in [facility] hospital lab) Nasopharyngeal Nasopharyngeal Swab     Status: None   Collection Time: 02/02/19  9:13 PM   Specimen: Nasopharyngeal Swab  Result Value Ref Range Status   SARS Coronavirus 2 NEGATIVE NEGATIVE Final    Comment: (NOTE) If result is NEGATIVE SARS-CoV-2 target nucleic acids are NOT DETECTED. The SARS-CoV-2 RNA is generally detectable in upper and lower  respiratory specimens during the acute phase of infection. The lowest  concentration of SARS-CoV-2 viral copies this assay can detect is 250  copies / mL. A negative result does not preclude SARS-CoV-2 infection  and should not be used as the sole basis for treatment or other  patient management decisions.  A negative result may occur with  improper specimen collection / handling, submission of specimen other  than nasopharyngeal swab, presence of viral mutation(s) within the  areas targeted by this assay, and inadequate number of viral copies  (<250 copies / mL). A negative result must be combined with clinical  observations, patient history, and epidemiological information. If result is POSITIVE SARS-CoV-2 target nucleic acids are DETECTED. The SARS-CoV-2 RNA is generally detectable in upper and lower  respiratory specimens dur ing the acute phase of infection.  Positive  results are  indicative of active infection with SARS-CoV-2.  Clinical  correlation with patient history and other diagnostic information is  necessary to determine patient infection status.  Positive  results do  not rule out bacterial infection or co-infection with other viruses. If result is PRESUMPTIVE POSTIVE SARS-CoV-2 nucleic acids MAY BE PRESENT.   A presumptive positive result was obtained on the submitted specimen  and confirmed on repeat testing.  While 2019 novel coronavirus  (SARS-CoV-2) nucleic acids may be present in the submitted sample  additional confirmatory testing may be necessary for epidemiological  and / or clinical management purposes  to differentiate between  SARS-CoV-2 and other Sarbecovirus currently known to infect humans.  If clinically indicated additional testing with an alternate test  methodology [phone]) is advised. The SARS-CoV-2 RNA is generally  detectable in upper and lower respiratory sp ecimens during the acute  phase of infection. The expected result is Negative. Fact Sheet for Patients:  StrictlyIdeas.no Fact Sheet for Healthcare Providers: BankingDealers.co.za This test is not yet approved or cleared by the [place] FDA and has been authorized for detection and/or diagnosis of SARS-CoV-2 by FDA under an Emergency Use Authorization (EUA).  This EUA will remain in effect (meaning this test can be used) for the duration of the COVID-19 declaration under Section 564(b)(1) of the Act, 21 U.S.C. section 360bbb-3(b)(1), unless the authorization is terminated or revoked sooner. Performed at [facility], [place] [address] [place], [place] [address]   Aerobic/Anaerobic Culture (surgical/deep wound)     Status: None (Preliminary result)   Collection Time: 02/03/19  2:25 AM   Specimen: Peritoneal; Body Fluid  Result Value Ref Range Status   Specimen Description   Final    PERITONEAL Performed  at [facility] [address] [place], [place] [address]    Special Requests   Final    NONE Performed at [facility], [place] [address] [place], [place] [address]    Gram Stain NO WBC SEEN RARE BUDDING YEAST SEEN   Final   Culture   Final    ABUNDANT YEAST CULTURE REINCUBATED FOR BETTER GROWTH Performed at [facility], [place] [address], [place], [place] [address]    Report Status PENDING  Incomplete  MRSA PCR Screening     Status: None   Collection Time: 02/03/19  2:53 AM   Specimen: Nasopharyngeal  Result Value Ref Range Status   MRSA by PCR NEGATIVE NEGATIVE Final    Comment:        The GeneXpert MRSA Assay (FDA approved for NASAL specimens only), is one component of a comprehensive MRSA colonization surveillance program. It is not intended to diagnose MRSA infection nor to guide or monitor treatment for MRSA infections. Performed at [facility], [place] [address] [place], [place] [address]     Studies/Results: Dg Chest Port 1 View  Result Date: 02/04/2019 CLINICAL DATA:  Shortness of breath. EXAM: PORTABLE CHEST 1 VIEW COMPARISON:  One-view chest x-ray 02/03/2019 FINDINGS: The heart size is normal. Endotracheal tube is stable. NG tube terminates in the stomach. Right IJ line is stable. Aeration in lung volumes are slightly improved. Mild pulmonary vascular congestion remains. IMPRESSION: 1. Slight improved aeration with persistent mild pulmonary vascular congestion. 2. Support apparatus is stable. Electronically Signed   By: San Morelle M.D.   On: 02/04/2019 08:36      Assessment/Plan:  INTERVAL HISTORY:   Patient seen by Dr. Baird Cancer with ophthalmology   Active Problems:   Pressure injury of skin   Perforated gastric ulcer ([place])    Gina [name] is a 58 y.o. 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  She has Candida glabrata fungal EMEA.  She has been seen by Dr. Baird Cancer ophthalmology and does not have evidence of endogenous fungal endophthalmitis  #1 Sepsis from gastric perforation.  She is growing Candida glabrata from blood.  Would continue her current broad-spectrum coverage with Zosyn and Eraxis.  While repeat blood cultures have been taken she will need to come off all of her central lines to affect clearance of fungemia  Clearly that is not going to happen right now while she is having continued pressor requirements  If she worsens I would get repeat CT scan of abdomen and pelvis. If she gets better I would also get one to make sure there is not residual abscess in the abdomen  Dr. Linus Salmons is covering over the Thanksgiving holidays and is available for questions.  He will follow up on her culture data.   LOS: 3 days   Alcide Evener 02/05/2019, 8:53 AM

## 2019-02-05 NOTE — Progress Notes (Addendum)
3 Days Post-Op    CC: Abdominal pain  Subjective: Patient sedated on the vent but looking around.  She appears comfortable she is no longer tachycardic.  Midline incision is clean.  JP drain is a dark brown-almost black serous fluid.   Objective: Vital signs in last 24 hours: Temp:  [98.1 F (36.7 C)-102.7 F (39.3 C)] 99.7 F (37.6 C) (11/25 0800) Pulse Rate:  [88-125] 94 (11/25 0804) Resp:  [15-27] 25 (11/25 0804) BP: (69-244)/(24-109) 134/68 (11/25 0800) SpO2:  [100 %] 100 % (11/25 0804) FiO2 (%):  [30 %] 30 % (11/25 0804) Weight:  [66.3 kg] 66.3 kg (11/25 0423) Last BM Date: 02/05/19 5265 IV 240 urine  800 NG JP Drain 1555 dark brown-black colored fluid Stool x 1 Afebrile since 1900 lst PM 101-102 range prior On 2 pressors now Vent PRVC rate 25 TV 400 PeeP 5, FiO2 30% Creatinine 2.63 H/H down to 8.5/25 Mag 1.4  Intake/Output from previous day: 11/24 0701 - 11/25 0700 In: 5265.1 [I.V.:4580.2; IV Piggyback:604.9] Out: 2595 [Urine:240; Emesis/NG output:800; Drains:1555] Intake/Output this shift: Total I/O In: 298.8 [I.V.:281.4; IV Piggyback:17.4] Out: 425 [Urine:200; Emesis/NG output:150; Drains:75]  General appearance: sedated but looking around some on the vent. GI: Open incision is clean and looks good.  The drain has a serous brownish almost black-colored fluid coming from it.  Abdomen is not distended. Extremities: She has stitches in her left hand where she had a third finger amputated.  Dr. Marcello Moores wants to leave the stitches in currently.  She has a moderate amount of extremity swelling. Both BKA's are closed and intact but ischemic in appearance.  She remains on 2 pressors.  Lab Results:  Recent Labs    02/04/19 0120 02/05/19 0230  WBC 5.9 5.5  HGB 11.8* 8.5*  HCT 38.4 25.1*  PLT 67* 46*    BMET Recent Labs    02/04/19 1947 02/05/19 0210  NA 131* 131*  K 4.4 4.1  CL 97* 95*  CO2 19* 22  GLUCOSE 274* 226*  BUN 46* 45*  CREATININE 2.72*  2.63*  CALCIUM 6.4* 6.7*   PT/INR Recent Labs    02/02/19 1832 02/02/19 1952  LABPROT 16.0* 15.2  INR 1.3* 1.2    Recent Labs  Lab 02/02/19 1832  AST 30  ALT 22  ALKPHOS 71  BILITOT 0.8  PROT 6.3*  ALBUMIN 2.4*     Lipase     Component Value Date/Time   LIPASE 27 04/01/2016 1040     Medications: . acetylcysteine      . chlorhexidine gluconate (MEDLINE KIT)  15 mL Mouth Rinse BID  . Chlorhexidine Gluconate Cloth  6 each Topical Daily  . mouth rinse  15 mL Mouth Rinse 10 times per day  . miconazole nitrate   Topical TID  . pantoprazole (PROTONIX) IV  40 mg Intravenous Q12H  . phenylephrine  1 drop Both Eyes UD  . sodium chloride flush  10-40 mL Intracatheter Q12H  . tropicamide  1 drop Both Eyes UD   . sodium chloride Stopped (02/03/19 1553)  . sodium chloride    . anidulafungin    . dextrose 50 mL/hr at 02/05/19 0743  . fentaNYL infusion INTRAVENOUS Stopped (02/05/19 0523)  . insulin 0.4 Units/hr (02/05/19 0817)  . norepinephrine (LEVOPHED) Adult infusion 15 mcg/min (02/05/19 0743)  . piperacillin-tazobactam (ZOSYN)  IV Stopped (02/05/19 4132)  .  sodium bicarbonate (isotonic) infusion in sterile water 125 mL/hr at 02/05/19 0743  . vasopressin (PITRESSIN) infusion - *FOR  SHOCK* 0.03 Units/min (02/05/19 0743)   Antibiotics Given (last 72 hours)    Date/Time Action Medication Dose Rate   02/02/19 1826 New Bag/Given   ceFEPIme (MAXIPIME) 2 g in sodium chloride 0.9 % 100 mL IVPB 2 g 200 mL/hr   02/02/19 1903 New Bag/Given   vancomycin (VANCOCIN) 1,250 mg in sodium chloride 0.9 % 250 mL IVPB 1,250 mg 166.7 mL/hr   02/02/19 2031 New Bag/Given   metroNIDAZOLE (FLAGYL) IVPB 500 mg 500 mg 100 mL/hr   02/03/19 0845 New Bag/Given   piperacillin-tazobactam (ZOSYN) IVPB 3.375 g 3.375 g 12.5 mL/hr   02/03/19 1554 New Bag/Given   piperacillin-tazobactam (ZOSYN) IVPB 3.375 g 3.375 g 12.5 mL/hr   02/03/19 2128 New Bag/Given   piperacillin-tazobactam (ZOSYN) IVPB 3.375 g  3.375 g 12.5 mL/hr   02/04/19 0525 New Bag/Given   piperacillin-tazobactam (ZOSYN) IVPB 3.375 g 3.375 g 12.5 mL/hr   02/04/19 1352 New Bag/Given   piperacillin-tazobactam (ZOSYN) IVPB 3.375 g 3.375 g 12.5 mL/hr   02/04/19 2133 New Bag/Given   piperacillin-tazobactam (ZOSYN) IVPB 3.375 g 3.375 g 12.5 mL/hr   02/05/19 0518 New Bag/Given   piperacillin-tazobactam (ZOSYN) IVPB 3.375 g 3.375 g 12.5 mL/hr      Assessment/Plan Poorly controlled DM2 - A1c 15.2 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 Lucia Gaskins - POD#2 - intraop culture pending - Blood cultures growing Candida Glabrata - BID wet to dry dressing changes to midline abdominal wound - continue NPO, NGT to LIWS - protonix BID - monitor JP drain output  ID - currently vancomycin/diflucan 11/23; Eraxis 11/24>> day 2;  Zosyn 11/23 >> day 3 FEN - NPO/NGT, prealbumin 6.3/IV fluids VTE -No SCDs, Ischemic Bilateral BKa's  - (sq heparin stopped 02/04/19) Foley - in place Follow up - TBD  Plan: Dr. Elsworth Soho discussed care with Dr. Marcello Moores.  They are going to place her on TNA but not immediately because of her positive blood cultures.  We will continue to watch the drainage from the [place].  We would not be surprised if she is still leaking, the color most likely from bile drainage.  Continue supportive care, and appreciate CCM's help with her care.       LOS: 3 days    Cam Harnden 02/05/2019 Please see Amion

## 2019-02-05 NOTE — Progress Notes (Signed)
ABG obtained per order. Lab called at 1628 and notified of incoming sample.

## 2019-02-05 NOTE — Progress Notes (Signed)
eLink Physician-Brief Progress Note Patient Name: Lajayla Zackery DOB: Jul 18, 1960 MRN: [id number]   Date of Service  02/05/2019  HPI/Events of Note  Hypotension  eICU Interventions  Albumin 5 % 500 ml iv x 1 ordered.        Kerry Kass Everard Interrante 02/05/2019, 12:36 AM

## 2019-02-06 ENCOUNTER — Inpatient Hospital Stay (HOSPITAL_COMMUNITY): Payer: Medicare HMO

## 2019-02-06 DIAGNOSIS — J9601 Acute respiratory failure with hypoxia: Secondary | ICD-10-CM

## 2019-02-06 DIAGNOSIS — A419 Sepsis, unspecified organism: Secondary | ICD-10-CM | POA: Diagnosis not present

## 2019-02-06 DIAGNOSIS — R6521 Severe sepsis with septic shock: Secondary | ICD-10-CM | POA: Diagnosis not present

## 2019-02-06 DIAGNOSIS — N179 Acute kidney failure, unspecified: Secondary | ICD-10-CM | POA: Diagnosis not present

## 2019-02-06 LAB — BASIC METABOLIC PANEL
Anion gap: 17 — ABNORMAL HIGH (ref 5–15)
Anion gap: 17 — ABNORMAL HIGH (ref 5–15)
Anion gap: 16 — ABNORMAL HIGH (ref 5–15)
BUN: 53 mg/dL — ABNORMAL HIGH (ref 6–20)
BUN: 50 mg/dL — ABNORMAL HIGH (ref 6–20)
BUN: 52 mg/dL — ABNORMAL HIGH (ref 6–20)
CO2: 25 mmol/L (ref 22–32)
CO2: 26 mmol/L (ref 22–32)
CO2: 28 mmol/L (ref 22–32)
Calcium: 6.4 mg/dL — CL (ref 8.9–10.3)
Calcium: 7.1 mg/dL — ABNORMAL LOW (ref 8.9–10.3)
Calcium: 6.9 mg/dL — ABNORMAL LOW (ref 8.9–10.3)
Chloride: 86 mmol/L — ABNORMAL LOW (ref 98–111)
Chloride: 87 mmol/L — ABNORMAL LOW (ref 98–111)
Chloride: 86 mmol/L — ABNORMAL LOW (ref 98–111)
Creatinine, Ser: 2.66 mg/dL — ABNORMAL HIGH (ref 0.44–1.00)
Creatinine, Ser: 2.63 mg/dL — ABNORMAL HIGH (ref 0.44–1.00)
Creatinine, Ser: 2.55 mg/dL — ABNORMAL HIGH (ref 0.44–1.00)
GFR calc Af Amer: 22 mL/min — ABNORMAL LOW (ref >60)
GFR calc Af Amer: 22 mL/min — ABNORMAL LOW (ref >60)
GFR calc Af Amer: 23 mL/min — ABNORMAL LOW (ref >60)
GFR calc non Af Amer: 19 mL/min — ABNORMAL LOW (ref >60)
GFR calc non Af Amer: 19 mL/min — ABNORMAL LOW (ref >60)
GFR calc non Af Amer: 20 mL/min — ABNORMAL LOW (ref >60)
Glucose, Bld: 105 mg/dL — ABNORMAL HIGH (ref 70–99)
Glucose, Bld: 95 mg/dL (ref 70–99)
Glucose, Bld: 64 mg/dL — ABNORMAL LOW (ref 70–99)
Potassium: 3 mmol/L — ABNORMAL LOW (ref 3.5–5.1)
Potassium: 3.2 mmol/L — ABNORMAL LOW (ref 3.5–5.1)
Potassium: 3 mmol/L — ABNORMAL LOW (ref 3.5–5.1)
Sodium: 128 mmol/L — ABNORMAL LOW (ref 135–145)
Sodium: 130 mmol/L — ABNORMAL LOW (ref 135–145)
Sodium: 130 mmol/L — ABNORMAL LOW (ref 135–145)

## 2019-02-06 LAB — CBC
HCT: 20.7 % — ABNORMAL LOW (ref 36.0–46.0)
Hemoglobin: 7 g/dL — ABNORMAL LOW (ref 12.0–15.0)
MCH: 29 pg (ref 26.0–34.0)
MCHC: 33.8 g/dL (ref 30.0–36.0)
MCV: 85.9 fL (ref 80.0–100.0)
Platelets: 33 10*3/uL — ABNORMAL LOW (ref 150–400)
RBC: 2.41 MIL/uL — ABNORMAL LOW (ref 3.87–5.11)
RDW: 13.9 % (ref 11.5–15.5)
WBC: 3 10*3/uL — ABNORMAL LOW (ref 4.0–10.5)
nRBC: 0 % (ref 0.0–0.2)

## 2019-02-06 LAB — MAGNESIUM
Magnesium: 2 mg/dL (ref 1.7–2.4)
Magnesium: 1.9 mg/dL (ref 1.7–2.4)

## 2019-02-06 LAB — BLOOD GAS, ARTERIAL
Acid-Base Excess: 6.8 mmol/L — ABNORMAL HIGH (ref 0.0–2.0)
Bicarbonate: 29.8 mmol/L — ABNORMAL HIGH (ref 20.0–28.0)
FIO2: 30
O2 Saturation: 99.8 %
Patient temperature: 98.6
pCO2 arterial: 35.8 mmHg (ref 32.0–48.0)
pH, Arterial: 7.53 — ABNORMAL HIGH (ref 7.350–7.450)
pO2, Arterial: 132 mmHg — ABNORMAL HIGH (ref 83.0–108.0)

## 2019-02-06 LAB — GLUCOSE, CAPILLARY
Glucose-Capillary: 103 mg/dL — ABNORMAL HIGH (ref 70–99)
Glucose-Capillary: 152 mg/dL — ABNORMAL HIGH (ref 70–99)
Glucose-Capillary: 169 mg/dL — ABNORMAL HIGH (ref 70–99)
Glucose-Capillary: 350 mg/dL — ABNORMAL HIGH (ref 70–99)

## 2019-02-06 LAB — PHOSPHORUS: Phosphorus: 5 mg/dL — ABNORMAL HIGH (ref 2.5–4.6)

## 2019-02-06 LAB — HEPATITIS C ANTIBODY (REFLEX): HCV Ab: >11 s/co ratio — ABNORMAL HIGH (ref 0.0–0.9)

## 2019-02-06 LAB — COMMENT2 - HEP PANEL

## 2019-02-06 LAB — HEPATITIS B SURFACE ANTIBODY, QUANTITATIVE: Hep B S AB Quant (Post): <3.1 m[IU]/mL — ABNORMAL LOW (ref >9.9)

## 2019-02-06 MED ORDER — CALCIUM GLUCONATE-NACL 2-0.675 GM/100ML-% IV SOLN
2.0000 g | Freq: Once | INTRAVENOUS | Status: AC
  Administered 2019-02-06: 2000 mg via INTRAVENOUS
  Filled 2019-02-06: qty 100

## 2019-02-06 MED ORDER — NOREPINEPHRINE 16 MG/250ML-% IV SOLN
0.0000 ug/min | INTRAVENOUS | Status: DC
  Administered 2019-02-06: 7 ug/min via INTRAVENOUS
  Administered 2019-02-08: 4 ug/min via INTRAVENOUS
  Filled 2019-02-06 (×3): qty 250

## 2019-02-06 MED ORDER — POTASSIUM CHLORIDE 10 MEQ/50ML IV SOLN: 10.0000 meq | INTRAVENOUS | Status: DC

## 2019-02-06 MED ORDER — LACTATED RINGERS IV BOLUS
500.0000 mL | Freq: Once | INTRAVENOUS | Status: AC
  Administered 2019-02-06: 500 mL via INTRAVENOUS

## 2019-02-06 MED ORDER — POTASSIUM CHLORIDE 10 MEQ/50ML IV SOLN
10.0000 meq | INTRAVENOUS | Status: AC
  Administered 2019-02-06 (×3): 10 meq via INTRAVENOUS
  Filled 2019-02-06 (×3): qty 50

## 2019-02-06 MED ORDER — POTASSIUM CHLORIDE 10 MEQ/50ML IV SOLN
10.0000 meq | INTRAVENOUS | Status: AC
  Administered 2019-02-06 (×4): 10 meq via INTRAVENOUS
  Filled 2019-02-06 (×4): qty 50

## 2019-02-06 MED ORDER — DEXTROSE 50 % IV SOLN
12.5000 g | Freq: Once | INTRAVENOUS | Status: AC
  Administered 2019-02-06: 12.5 g via INTRAVENOUS

## 2019-02-06 MED ORDER — POTASSIUM CHLORIDE CRYS ER 20 MEQ PO TBCR: 30.0000 meq | EXTENDED_RELEASE_TABLET | ORAL | Status: DC

## 2019-02-06 MED ORDER — HEPARIN SODIUM (PORCINE) 5000 UNIT/ML IJ SOLN
5000.0000 [IU] | Freq: Three times a day (TID) | INTRAMUSCULAR | Status: DC
  Administered 2019-02-06: 5000 [IU] via SUBCUTANEOUS
  Filled 2019-02-06: qty 1

## 2019-02-06 NOTE — Progress Notes (Signed)
eLink Physician-Brief Progress Note Patient Name: Gina [name] DOB: [birth year] MRN: [id number]   Date of Service  02/06/2019  HPI/Events of Note  K+ 3.0, Calcium 6.4, Pt is without DVT prophylaxis.  eICU Interventions  KCL 10 meq hourly x 3 hours, Calcium gluconate 2 gm iv, Heparin 5,000 units SQ Q 8 hours ordered.        Kerry Kass Amol Domanski 02/06/2019, 5:26 AM

## 2019-02-06 NOTE — Progress Notes (Signed)
Fentanyl waste with Yehuda Budd RN of 117ml 11/25 @1700 

## 2019-02-06 NOTE — Progress Notes (Signed)
Attempted to wean patient x3; failed due to low Ve and Vt.

## 2019-02-06 NOTE — Progress Notes (Signed)
CRITICAL VALUE ALERT  Critical Value:  Calcium at 6.4  Date & Time Notied: 02/06/2019 0400  Provider Notified: Elink notified   Orders Received/Actions taken: Waiting for orders; RN will continue to monitor

## 2019-02-06 NOTE — Progress Notes (Signed)
Subjective: Interval History: sedated on vent  Objective: Vital signs in last 24 hours: Temp:  [97.9 F (36.6 C)-99.5 F (37.5 C)] 98.4 F (36.9 C) (11/26 0700) Pulse Rate:  [83-96] 90 (11/26 0700) Resp:  [22-29] 25 (11/26 0700) BP: (71-152)/(37-82) 78/49 (11/26 0700) SpO2:  [99 %-100 %] 100 % (11/26 0323) FiO2 (%):  [30 %] 30 % (11/26 0400) Weight:  [65.1 kg] 65.1 kg (11/26 0436)  Intake/Output from previous day: 11/25 0701 - 11/26 0700 In: 2255.6 [I.V.:2027.7; IV Piggyback:228] Out: [id number] [Urine:1870; Emesis/NG output:650; Drains:1935] Intake/Output this shift: No intake/output data recorded.  General appearance: opens eyes to stimulation Resp: clear to auscultation bilaterally and on vent Cardio: regular rate and rhythm GI: soft,nontender. drain output dark but watery  Lab Results: Recent Labs    02/05/19 0230 02/06/19 0147  WBC 5.5 3.0*  HGB 8.5* 7.0*  HCT 25.1* 20.7*  PLT 46* 33*   BMET Recent Labs    02/06/19 0147 02/06/19 0725  NA 128* 130*  K 3.0* 3.2*  CL 86* 87*  CO2 25 26  GLUCOSE 105* 95  BUN 53* 50*  CREATININE 2.66* 2.63*  CALCIUM 6.4* 7.1*    Studies/Results: Ct Abdomen Pelvis Wo Contrast  Result Date: 02/02/2019 CLINICAL DATA:  Abdominal pain. EXAM: CT ABDOMEN AND PELVIS WITHOUT CONTRAST TECHNIQUE: Multidetector CT imaging of the abdomen and pelvis was performed following the standard protocol without IV contrast. COMPARISON:  06/15/2013 FINDINGS: Lower chest: There is mild airspace disease at the lung bases bilaterally.The heart size is normal. The intracardiac blood pool is hypodense relative to the adjacent myocardium consistent with anemia. Hepatobiliary: The liver is normal. Cholelithiasis without acute inflammation.There is no biliary ductal dilation. Pancreas: Normal contours without ductal dilatation. No peripancreatic fluid collection. Spleen: No splenic laceration or hematoma. Adrenals/Urinary Tract: --Adrenal glands: No adrenal  hemorrhage. --Right kidney/ureter: No hydronephrosis or perinephric hematoma. --Left kidney/ureter: No hydronephrosis or perinephric hematoma. --Urinary bladder: The bladder is decompressed with a Foley catheter which limits evaluation. Stomach/Bowel: --Stomach/Duodenum: There is some wall thickening of the distal esophagus. The stomach is mildly distended. --Small bowel: There is pneumatosis involving multiple small bowel loops. The small bowel is mildly dilated without evidence for a high-grade obstruction. --Colon: No focal abnormality. --Appendix: Normal. Vascular/Lymphatic: There are minimal atherosclerotic changes of the abdominal aorta. Gas is noted within the SMV and draining mesenteric veins. --there are some prominent retroperitoneal lymph nodes that are suboptimally evaluated in the absence of IV contrast. --No mesenteric lymphadenopathy. --No pelvic or inguinal lymphadenopathy. Reproductive: Unremarkable Other: There is a large amount of free air within the abdomen. There is a moderate volume of somewhat complex free fluid in the abdomen. There is mild body wall edema. Musculoskeletal. No acute displaced fractures. IMPRESSION: 1. Findings consistent with a hollow viscus perforation as evidence by a large volume of free air and a moderate volume a complex free fluid throughout the abdomen. 2. 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 Gas is noted within the SMV and draining mesenteric veins. Findings are concerning for mesenteric ischemia 3. There is cholelithiasis without secondary signs of acute cholecystitis. 4. Bibasilar airspace disease may represent atelectasis, pneumonia, or aspiration in the appropriate clinical setting. These results were called by telephone at the time of interpretation on 02/02/2019 at 9:25 pm to provider Sherwood Gambler , who verbally acknowledged these results. Electronically Signed   By: Constance Holster M.D.   On: 02/02/2019 21:42   Ct Head Wo  Contrast  Result Date: 02/02/2019 CLINICAL  DATA:  Found on the floor at home.  Nausea and vomiting. EXAM: CT HEAD WITHOUT CONTRAST TECHNIQUE: Contiguous axial images were obtained from the base of the skull through the vertex without intravenous contrast. COMPARISON:  03/13/2018 FINDINGS: Brain: No evidence of acute infarction, hemorrhage, hydrocephalus, extra-axial collection or mass lesion/mass effect. Vascular: No hyperdense vessel or unexpected calcification. Skull: Normal. Negative for fracture or focal lesion. Sinuses/Orbits: Globes and orbits are unremarkable. Visualized sinuses and mastoid air cells are clear. Other: None. IMPRESSION: Normal enhanced CT scan of the brain. Electronically Signed   By: Lajean Manes M.D.   On: 02/02/2019 21:38   Dg Chest Port 1 View  Result Date: 02/06/2019 CLINICAL DATA:  Acute respiratory failure EXAM: PORTABLE CHEST 1 VIEW COMPARISON:  Radiograph 02/04/2019 FINDINGS: Right IJ catheter tip terminates in the lower SVC. Transesophageal tube tip and side port distal to the GE junction, curling in the left upper quadrant. Endotracheal tube terminates in the mid trachea, 3 cm from the carina. Basilar gradient opacity may reflect some atelectasis or developing pleural fluid. There is a bandlike opacity in the right mid lung likely reflecting subsegmental atelectasis. Mild pulmonary venous congestion is similar to priors. IMPRESSION: 1. Stable satisfactory positioning of lines and tubes. 2. Basilar gradient opacity may reflect some worsening atelectasis or developing pleural fluid. 3. Bandlike opacity in the right lung base possibly subsegmental atelectasis. Electronically Signed   By: Lovena Le M.D.   On: 02/06/2019 05:11   Dg Chest Port 1 View  Result Date: 02/04/2019 CLINICAL DATA:  Shortness of breath. EXAM: PORTABLE CHEST 1 VIEW COMPARISON:  One-view chest x-ray 02/03/2019 FINDINGS: The heart size is normal. Endotracheal tube is stable. NG tube terminates in the  stomach. Right IJ line is stable. Aeration in lung volumes are slightly improved. Mild pulmonary vascular congestion remains. IMPRESSION: 1. Slight improved aeration with persistent mild pulmonary vascular congestion. 2. Support apparatus is stable. Electronically Signed   By: San Morelle M.D.   On: 02/04/2019 08:36   Dg Chest Port 1 View  Result Date: 02/03/2019 CLINICAL DATA:  ET and NG placement EXAM: PORTABLE CHEST 1 VIEW COMPARISON:  Radiograph 02/02/2019 FINDINGS: Endotracheal tube terminates in the mid trachea, 4.5 cm from the carina. Transesophageal tube tip and side port are distal to the GE junction curling in the left upper quadrant. Right IJ approach catheter sheath tip terminates at the superior cavoatrial junction. Lung volumes are low with some streaky atelectatic changes. Cardiomediastinal contours are unremarkable for portable technique. No acute osseous or soft tissue abnormality. IMPRESSION: 1. Endotracheal tube terminates in the mid trachea, 4.5 cm from the carina. 2. Transesophageal tube tip and side port are distal to the GE junction. 3. Right IJ catheter terminates at the superior cavoatrial junction. 4. Basilar atelectasis otherwise no acute cardiopulmonary abnormality. Electronically Signed   By: Lovena Le M.D.   On: 02/03/2019 03:35   Dg Chest Port 1 View  Result Date: 02/02/2019 CLINICAL DATA:  Patient arrived by EMS from home. Pt was found on the floor at home. Pt has N/V. Patient is urinary and fecal incontinent. Hx of DM and HTN. EXAM: PORTABLE CHEST 1 VIEW COMPARISON:  04/01/2016 FINDINGS: Cardiac silhouette is normal in size. No mediastinal or hilar masses. Lung volumes are low. Lungs are clear. No pleural effusion or pneumothorax. Skeletal structures are grossly intact. IMPRESSION: No active disease. Electronically Signed   By: Lajean Manes M.D.   On: 02/02/2019 19:56   Anti-infectives: Anti-infectives (From admission, onward)  Start     Dose/Rate Route  Frequency Ordered Stop   02/05/19 1000  anidulafungin (ERAXIS) 100 mg in sodium chloride 0.9 % 100 mL IVPB     100 mg 78 mL/hr over 100 Minutes Intravenous Every 24 hours 02/04/19 0840     02/04/19 1000  anidulafungin (ERAXIS) 200 mg in sodium chloride 0.9 % 200 mL IVPB     200 mg 78 mL/hr over 200 Minutes Intravenous  Once 02/04/19 0840 02/04/19 1301   02/04/19 0200  fluconazole (DIFLUCAN) IVPB 200 mg  Status:  Discontinued     200 mg 100 mL/hr over 60 Minutes Intravenous Every 24 hours 02/02/19 2302 02/04/19 0840   02/03/19 1800  vancomycin (VANCOCIN) IVPB 750 mg/150 ml premix  Status:  Discontinued     750 mg 150 mL/hr over 60 Minutes Intravenous Every 24 hours 02/03/19 0241 02/03/19 0242   02/03/19 1800  vancomycin (VANCOCIN) IVPB 750 mg/150 ml premix  Status:  Discontinued     750 mg 150 mL/hr over 60 Minutes Intravenous Every 24 hours 02/03/19 0242 02/03/19 0858   02/03/19 0600  piperacillin-tazobactam (ZOSYN) IVPB 3.375 g     3.375 g 12.5 mL/hr over 240 Minutes Intravenous Every 8 hours 02/03/19 0243     02/02/19 2315  fluconazole (DIFLUCAN) IVPB 400 mg     400 mg 100 mL/hr over 120 Minutes Intravenous STAT 02/02/19 2301 02/03/19 0218   02/02/19 1800  ceFEPIme (MAXIPIME) 2 g in sodium chloride 0.9 % 100 mL IVPB     2 g 200 mL/hr over 30 Minutes Intravenous  Once 02/02/19 1745 02/02/19 2030   02/02/19 1800  metroNIDAZOLE (FLAGYL) IVPB 500 mg     500 mg 100 mL/hr over 60 Minutes Intravenous  Once 02/02/19 1745 02/02/19 2237   02/02/19 1800  vancomycin (VANCOCIN) IVPB 1000 mg/200 mL premix  Status:  Discontinued     1,000 mg 200 mL/hr over 60 Minutes Intravenous  Once 02/02/19 1745 02/02/19 1747   02/02/19 1800  vancomycin (VANCOCIN) 1,250 mg in sodium chloride 0.9 % 250 mL IVPB     1,250 mg 166.7 mL/hr over 90 Minutes Intravenous  Once 02/02/19 1747 02/02/19 2237      Assessment/Plan: s/p Procedure(s): EXPLORATORY LAPAROTOMY WITH OVERSEW OF DUODENAL ULCER (N/A) Continue  abx and drain  Will need tpn for nutrition support in the near future VDRF per CCM Poorly controlled DM2 - A1c15.2 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 Lucia Gaskins - POD#2 - intraop culture pending - Blood cultures growing Candida Glabrata - BID wet to dry dressing changes to midline abdominal wound - continue NPO, NGT to LIWS - protonix BID - monitor JP drain output  ID - currently vancomycin/diflucan 11/23; Eraxis 11/24>> day 2;  Zosyn 11/23 >> day 3 FEN - NPO/NGT, prealbumin 6.3/IV fluids VTE -No SCDs, Ischemic Bilateral BKa's  - (sq heparin stopped 02/04/19) Foley - in place Follow up - TBD  Plan: Dr. Elsworth Soho discussed care with Dr. Marcello Moores.  They are going to place her on TNA but not immediately because of her positive blood cultures.  We  will continue to watch the drainage from the [place].  We would not be surprised if she is still leaking, the color most likely from bile drainage.  Continue supportive care, and appreciate CCM's help with her care.   LOS: 4 days   [name] 02/06/2019, 8:06 AM

## 2019-02-06 NOTE — Progress Notes (Signed)
NAME:  Gina [name], MRN:  [id number], DOB:  01-Jul-1960, LOS: 4 ADMISSION DATE:  02/02/2019, CONSULTATION DATE:  02/06/19  REFERRING MD:  Sherwood Gambler, CHIEF COMPLAINT:  Abdominal pain    History of present illness   Gina [name] is a 58 y.o. 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 In the ED, she was hypothermic and severely hypotensive. Initial blood glucose >1000.  CT abdomen showed pneumoperitoneum and mesenteric ischemia. She underwent exploratory Laparotomy with finding of perforated prepyloric ulcer and 3.1 L of contaminated peritoneal fluid  Past Medical History  Poorly controlled DM2 with diabetic foot wounds s/p b/l BKA Recent left phalanx amputation HTN Schizophrenia   Consults:  General Surgery (Dr. Lucia Gaskins)  Procedures:  11/23 >> ex lap with oversewing of duodenal ulcer with [name] patch , pyloric channel ulcer perforation with 3.1 L of contaminated peritoneal fluid, no evidence of small bowel ischemia  11/24 >> Ophtho eval negative for endophthalmitis   LDA RIJ 11/22 >> Lt radial a line 11/22 >>11/23 RT JP 11/22 >>  Significant Diagnostic Tests:  CT a/p without contrast 11/22 >> findings consistent with hollow viscus perforation and extensive pneumatosis involving multiple loops of small bowel concerning for mesenteric ischemia. The scan also noted cholelithiasis without cholecystitis and bibasilar airspace disease.  CT head 11/22 neg  Micro Data:  Blood 11/22 >> Candida glabrata urine  11/22 >> klebsiella BC 11/24 >>  Antimicrobials:  Cefepime 11/22 (1 dose) Metronidazole 11/22 (1 dose) Vancomycin 11/22 >>11/23 Fluconazole 11/22 >>11/24 Zosyn 11/23 >> Anidulafungin 11/24 >>   SUBJ - Remains on levophed and vasopressin. Opens eyes and follows  commands. Great UOP   Objective   Blood pressure 139/72, pulse 91, temperature 99.3 F (37.4 C), resp. rate (!) 24, height 5\' 2"  (1.575 m), weight 65.1 kg, last menstrual period 03/29/2015, SpO2 100 %. CVP:  [5 mmHg-6 mmHg] 5 mmHg  Vent Mode: PRVC FiO2 (%):  [30 %] 30 % Set Rate:  [25 bmp] 25 bmp Vt Set:  [400 mL] 400 mL PEEP:  [5 cmH20] 5 cmH20 Plateau Pressure:  [18 cmH20-20 cmH20] 20 cmH20   Intake/Output Summary (Last 24 hours) at 02/06/2019 1522 Last data filed at 02/06/2019 1445 Gross per 24 hour  Intake 3781.74 ml  Output 3435 ml  Net 346.74 ml   Filed Weights   02/04/19 0500 02/05/19 0423 02/06/19 0436  Weight: 63.2 kg 66.3 kg 65.1 kg   Physical Exam: General: Chronically ill-appearing, on mechanical ventilation HENT: [place], AT, ETT in place Eyes: EOMI, no scleral icterus Respiratory: Vented breath sounds  No crackles, wheezing or rales Cardiovascular: RRR, -M/R/G, no JVD GI: BS+, soft, JP drain in place Extremities: S/p bilateral BKA with hyperpigmented dusky regions around the stymps, 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 Will start TNP when fungemia resolves -Drain  management and wound managmeent per Surgery -Follow-up intra-op cultures -Follow-up blood cultures for clearance of fungemia -Consider CT A/P if febrile or worsening leukocytosis  Acute respiratory failure  Respiratory alkalosis Failed SBT today  Plan: Daily SBT and WUA Full vent support. Decreased TV to 7cc/kg  DKA with AGA Hypoglycemia 11/23 -DC sodium bicarbonate -Continue D10 to avoid hypoglycemia -Trend BMP  AKI -prerenal versus ATN  Hypokalemia Improved UOP. Baseline creatinine ~0.50 Plan: -LR bolus  -Replete K -Strict I/O, insert and maintain Foley -Avoid nephrotoxins -Renally dose meds for CrCl <50  Protein -calorie malnutrition Plan: -Continue D10 -Will plan for TNA when fungemia resolves. 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 Can transition off insulin drip now that beta hydroxybutyrate resolved   Best practice:  Diet: NPO Pain/Anxiety/Delirium protocol (if indicated): Fent VAP protocol (if indicated): Chlorhexidine DVT prophylaxis: SQH GI prophylaxis: PPI Glucose control: Insulin infusion Mobility: Bed rest Code Status: Full Family Communication: son & Daughter, [name] [phone]) Disposition: ICU  The patient is critically ill with multiple organ systems failure and requires high complexity decision making for assessment and support, frequent evaluation and titration of therapies, application of advanced monitoring technologies and extensive interpretation of multiple databases.   Critical Care Time devoted to patient care services described in this note is 39 Minutes. This time reflects time of care of this signee Dr. Rodman Pickle.   Rodman Pickle, M.D. [facility] Pulmonary/Critical Care Medicine 02/06/2019 3:22 PM  Pager:  [phone] After hours pager: [phone]

## 2019-02-07 DIAGNOSIS — N179 Acute kidney failure, unspecified: Secondary | ICD-10-CM | POA: Diagnosis not present

## 2019-02-07 DIAGNOSIS — A419 Sepsis, unspecified organism: Secondary | ICD-10-CM | POA: Diagnosis not present

## 2019-02-07 DIAGNOSIS — K251 Acute gastric ulcer with perforation: Secondary | ICD-10-CM | POA: Diagnosis not present

## 2019-02-07 DIAGNOSIS — J9601 Acute respiratory failure with hypoxia: Secondary | ICD-10-CM | POA: Diagnosis not present

## 2019-02-07 LAB — BASIC METABOLIC PANEL
Anion gap: 15 (ref 5–15)
Anion gap: 15 (ref 5–15)
BUN: 50 mg/dL — ABNORMAL HIGH (ref 6–20)
BUN: 51 mg/dL — ABNORMAL HIGH (ref 6–20)
CO2: 27 mmol/L (ref 22–32)
CO2: 25 mmol/L (ref 22–32)
Calcium: 7 mg/dL — ABNORMAL LOW (ref 8.9–10.3)
Calcium: 6.9 mg/dL — ABNORMAL LOW (ref 8.9–10.3)
Chloride: 86 mmol/L — ABNORMAL LOW (ref 98–111)
Chloride: 89 mmol/L — ABNORMAL LOW (ref 98–111)
Creatinine, Ser: 2.52 mg/dL — ABNORMAL HIGH (ref 0.44–1.00)
Creatinine, Ser: 2.49 mg/dL — ABNORMAL HIGH (ref 0.44–1.00)
GFR calc Af Amer: 24 mL/min — ABNORMAL LOW (ref >60)
GFR calc Af Amer: 24 mL/min — ABNORMAL LOW (ref >60)
GFR calc non Af Amer: 20 mL/min — ABNORMAL LOW (ref >60)
GFR calc non Af Amer: 21 mL/min — ABNORMAL LOW (ref >60)
Glucose, Bld: 72 mg/dL (ref 70–99)
Glucose, Bld: 169 mg/dL — ABNORMAL HIGH (ref 70–99)
Potassium: 3 mmol/L — ABNORMAL LOW (ref 3.5–5.1)
Potassium: 3.3 mmol/L — ABNORMAL LOW (ref 3.5–5.1)
Sodium: 128 mmol/L — ABNORMAL LOW (ref 135–145)
Sodium: 129 mmol/L — ABNORMAL LOW (ref 135–145)

## 2019-02-07 LAB — CBC
HCT: 20.2 % — ABNORMAL LOW (ref 36.0–46.0)
Hemoglobin: 7 g/dL — ABNORMAL LOW (ref 12.0–15.0)
MCH: 30.2 pg (ref 26.0–34.0)
MCHC: 34.7 g/dL (ref 30.0–36.0)
MCV: 87.1 fL (ref 80.0–100.0)
Platelets: 31 10*3/uL — ABNORMAL LOW (ref 150–400)
RBC: 2.32 MIL/uL — ABNORMAL LOW (ref 3.87–5.11)
RDW: 14.5 % (ref 11.5–15.5)
WBC: 3.3 10*3/uL — ABNORMAL LOW (ref 4.0–10.5)
nRBC: 0 % (ref 0.0–0.2)

## 2019-02-07 LAB — BLOOD GAS, ARTERIAL
Acid-Base Excess: 6.2 mmol/L — ABNORMAL HIGH (ref 0.0–2.0)
Bicarbonate: 29.5 mmol/L — ABNORMAL HIGH (ref 20.0–28.0)
FIO2: 30
Patient temperature: 97.7
pCO2 arterial: 36.2 mmHg (ref 32.0–48.0)
pH, Arterial: 7.519 — ABNORMAL HIGH (ref 7.350–7.450)
pO2, Arterial: 160 mmHg — ABNORMAL HIGH (ref 83.0–108.0)

## 2019-02-07 LAB — GLUCOSE, CAPILLARY
Glucose-Capillary: 48 mg/dL — ABNORMAL LOW (ref 70–99)
Glucose-Capillary: 121 mg/dL — ABNORMAL HIGH (ref 70–99)
Glucose-Capillary: 69 mg/dL — ABNORMAL LOW (ref 70–99)
Glucose-Capillary: 91 mg/dL (ref 70–99)
Glucose-Capillary: 123 mg/dL — ABNORMAL HIGH (ref 70–99)
Glucose-Capillary: 55 mg/dL — ABNORMAL LOW (ref 70–99)
Glucose-Capillary: 102 mg/dL — ABNORMAL HIGH (ref 70–99)
Glucose-Capillary: 162 mg/dL — ABNORMAL HIGH (ref 70–99)
Glucose-Capillary: 165 mg/dL — ABNORMAL HIGH (ref 70–99)
Glucose-Capillary: 150 mg/dL — ABNORMAL HIGH (ref 70–99)
Glucose-Capillary: 224 mg/dL — ABNORMAL HIGH (ref 70–99)

## 2019-02-07 LAB — CALCIUM, IONIZED: Calcium, Ionized, Serum: 3.9 mg/dL — ABNORMAL LOW (ref 4.5–5.6)

## 2019-02-07 LAB — MAGNESIUM: Magnesium: 1.9 mg/dL (ref 1.7–2.4)

## 2019-02-07 MED ORDER — POTASSIUM CHLORIDE 10 MEQ/50ML IV SOLN
10.0000 meq | INTRAVENOUS | Status: AC
  Administered 2019-02-07 (×6): 10 meq via INTRAVENOUS
  Filled 2019-02-07 (×5): qty 50

## 2019-02-07 MED ORDER — POTASSIUM CHLORIDE 20 MEQ/15ML (10%) PO SOLN: 30.0000 meq | ORAL | Status: DC

## 2019-02-07 MED ORDER — SODIUM CHLORIDE 0.9 % IV BOLUS
1000.0000 mL | Freq: Once | INTRAVENOUS | Status: AC
  Administered 2019-02-07: 1000 mL via INTRAVENOUS

## 2019-02-07 MED ORDER — POTASSIUM CHLORIDE 10 MEQ/50ML IV SOLN
10.0000 meq | INTRAVENOUS | Status: AC
  Administered 2019-02-07 (×2): 10 meq via INTRAVENOUS
  Filled 2019-02-07 (×2): qty 50

## 2019-02-07 MED ORDER — MAGNESIUM SULFATE 2 GM/50ML IV SOLN
2.0000 g | Freq: Once | INTRAVENOUS | Status: AC
  Administered 2019-02-07: 2 g via INTRAVENOUS
  Filled 2019-02-07: qty 50

## 2019-02-07 MED ORDER — INSULIN ASPART 100 UNIT/ML ~~LOC~~ SOLN
0.0000 [IU] | SUBCUTANEOUS | Status: DC
  Administered 2019-02-07 (×2): 2 [IU] via SUBCUTANEOUS
  Administered 2019-02-07: 1 [IU] via SUBCUTANEOUS
  Administered 2019-02-08 (×2): 2 [IU] via SUBCUTANEOUS
  Administered 2019-02-08: 3 [IU] via SUBCUTANEOUS
  Administered 2019-02-08 – 2019-02-09 (×3): 2 [IU] via SUBCUTANEOUS
  Administered 2019-02-09: 3 [IU] via SUBCUTANEOUS
  Administered 2019-02-09: 5 [IU] via SUBCUTANEOUS
  Administered 2019-02-09 (×3): 2 [IU] via SUBCUTANEOUS
  Administered 2019-02-09: 1 [IU] via SUBCUTANEOUS
  Administered 2019-02-10 (×2): 2 [IU] via SUBCUTANEOUS
  Administered 2019-02-10: 3 [IU] via SUBCUTANEOUS
  Administered 2019-02-10: 1 [IU] via SUBCUTANEOUS
  Administered 2019-02-11: 3 [IU] via SUBCUTANEOUS
  Administered 2019-02-11: 4 [IU] via SUBCUTANEOUS
  Administered 2019-02-11 (×2): 2 [IU] via SUBCUTANEOUS

## 2019-02-07 NOTE — Progress Notes (Signed)
Patient ID: Gina [name], female   DOB: 12-03-1960, 58 y.o.   MRN: [id number] 5 Days Post-Op  Subjective: No complaints. On vent  Objective: Vital signs in last 24 hours: Temp:  [98.1 F (36.7 C)-99.5 F (37.5 C)] 98.2 F (36.8 C) (11/27 0815) Pulse Rate:  [83-97] 84 (11/27 0815) Resp:  [0-29] 16 (11/27 0815) BP: (69-159)/(38-88) 113/57 (11/27 0815) SpO2:  [100 %] 100 % (11/27 0815) FiO2 (%):  [30 %] 30 % (11/27 0815) Weight:  [66.9 kg] 66.9 kg (11/27 0500) Last BM Date: 02/07/19  Intake/Output from previous day: 11/26 0701 - 11/27 0700 In: 5176.4 [I.V.:4013; IV Piggyback:1163.3] Out: 1940 [Urine:960; Drains:980] Intake/Output this shift: Total I/O In: 184.2 [I.V.:159.4; IV Piggyback:24.8] Out: 200 [Urine:175; Drains:25]  General appearance: alert and cooperative Resp: clear to auscultation bilaterally and on vent Cardio: regular rate and rhythm GI: soft, mild tenderness. drain output dark but watery  Lab Results:  Recent Labs    02/06/19 0147 02/07/19 0348  WBC 3.0* 3.3*  HGB 7.0* 7.0*  HCT 20.7* 20.2*  PLT 33* 31*   BMET Recent Labs    02/06/19 1535 02/07/19 0348  NA 130* 128*  K 3.0* 3.0*  CL 86* 86*  CO2 28 27  GLUCOSE 64* 72  BUN 52* 50*  CREATININE 2.55* 2.52*  CALCIUM 6.9* 7.0*   PT/INR No results for input(s): LABPROT, INR in the last 72 hours. ABG Recent Labs    02/06/19 1700 02/07/19 0340  PHART 7.530* 7.519*  HCO3 29.8* 29.5*    Studies/Results: Dg Chest Port 1 View  Result Date: 02/06/2019 CLINICAL DATA:  Acute respiratory failure EXAM: PORTABLE CHEST 1 VIEW COMPARISON:  Radiograph 02/04/2019 FINDINGS: Right IJ catheter tip terminates in the lower SVC. Transesophageal tube tip and side port distal to the GE junction, curling in the left upper quadrant. Endotracheal tube terminates in the mid trachea, 3 cm from the carina. Basilar gradient opacity may reflect some atelectasis or developing pleural fluid. There is a bandlike opacity in  the right mid lung likely reflecting subsegmental atelectasis. Mild pulmonary venous congestion is similar to priors. IMPRESSION: 1. Stable satisfactory positioning of lines and tubes. 2. Basilar gradient opacity may reflect some worsening atelectasis or developing pleural fluid. 3. Bandlike opacity in the right lung base possibly subsegmental atelectasis. Electronically Signed   By: Lovena Le M.D.   On: 02/06/2019 05:11    Anti-infectives: Anti-infectives (From admission, onward)   Start     Dose/Rate Route Frequency Ordered Stop   02/05/19 1000  anidulafungin (ERAXIS) 100 mg in sodium chloride 0.9 % 100 mL IVPB     100 mg 78 mL/hr over 100 Minutes Intravenous Every 24 hours 02/04/19 0840     02/04/19 1000  anidulafungin (ERAXIS) 200 mg in sodium chloride 0.9 % 200 mL IVPB     200 mg 78 mL/hr over 200 Minutes Intravenous  Once 02/04/19 0840 02/04/19 1301   02/04/19 0200  fluconazole (DIFLUCAN) IVPB 200 mg  Status:  Discontinued     200 mg 100 mL/hr over 60 Minutes Intravenous Every 24 hours 02/02/19 2302 02/04/19 0840   02/03/19 1800  vancomycin (VANCOCIN) IVPB 750 mg/150 ml premix  Status:  Discontinued     750 mg 150 mL/hr over 60 Minutes Intravenous Every 24 hours 02/03/19 0241 02/03/19 0242   02/03/19 1800  vancomycin (VANCOCIN) IVPB 750 mg/150 ml premix  Status:  Discontinued     750 mg 150 mL/hr over 60 Minutes Intravenous Every 24 hours 02/03/19 0242  02/03/19 0858   02/03/19 0600  piperacillin-tazobactam (ZOSYN) IVPB 3.375 g     3.375 g 12.5 mL/hr over 240 Minutes Intravenous Every 8 hours 02/03/19 0243     02/02/19 2315  fluconazole (DIFLUCAN) IVPB 400 mg     400 mg 100 mL/hr over 120 Minutes Intravenous STAT 02/02/19 2301 02/03/19 0218   02/02/19 1800  ceFEPIme (MAXIPIME) 2 g in sodium chloride 0.9 % 100 mL IVPB     2 g 200 mL/hr over 30 Minutes Intravenous  Once 02/02/19 1745 02/02/19 2030   02/02/19 1800  metroNIDAZOLE (FLAGYL) IVPB 500 mg     500 mg 100 mL/hr over 60  Minutes Intravenous  Once 02/02/19 1745 02/02/19 2237   02/02/19 1800  vancomycin (VANCOCIN) IVPB 1000 mg/200 mL premix  Status:  Discontinued     1,000 mg 200 mL/hr over 60 Minutes Intravenous  Once 02/02/19 1745 02/02/19 1747   02/02/19 1800  vancomycin (VANCOCIN) 1,250 mg in sodium chloride 0.9 % 250 mL IVPB     1,250 mg 166.7 mL/hr over 90 Minutes Intravenous  Once 02/02/19 1747 02/02/19 2237      Assessment/Plan: s/p Procedure(s): EXPLORATORY LAPAROTOMY WITH OVERSEW OF DUODENAL ULCER Continue abx and drain  VDRF per CCM Will need tpn for nutrition support in the near future VDRF per CCM Poorly controlled DM2 - A1c15.2 S/p bilateral BKA- both ischemic in appearance CKD - creatinine 2.06>>2.65>>2.72>>2.63 HTN Schizophrenia VDRF DKA AKI, oliguria Leukopenia Severe malnutrition Anemia  Hypomagnesemia - defer to CCM 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 Lucia Gaskins - POD#2 - intraop culture pending - Blood cultures growing Candida Glabrata - BID wet to dry dressing changes to midline abdominal wound - continue NPO, NGT to LIWS - protonix BID - monitor JP drain output  ID - currently vancomycin/diflucan 11/23; Eraxis 11/24>> day 2; Zosyn 11/23 >>day 3 FEN - NPO/NGT, prealbumin 6.3/IV fluids VTE -NoSCDs,Ischemic Bilateral BKa's - (sq heparin stopped 02/04/19) Foley - in place Follow up - TBD  Plan: Dr. Elsworth Soho discussed care with Dr. Marcello Moores. They are going to place her on TNA but not immediately because of her positive blood cultures. We will continue to watch the drainage from the [place]. We would not be surprised if she is still leaking, the color most likely from bile drainage. Continue  supportive care, and appreciate CCM's help with her care.  LOS: 5 days    [name] 02/07/2019

## 2019-02-07 NOTE — Progress Notes (Signed)
  02/07/2019 0400  CBG 55 @ 0356  Dextrose 50% 25 mL given  CBG 123 @ 0429  Will continue to monitor patient.  Provider notified.

## 2019-02-07 NOTE — Progress Notes (Signed)
Pharmacy Antibiotic Note  Gina [name] is a 58 y.o. female admitted on 02/02/2019 with septic shock secondary to peritonitis with perforated viscus. Blood cx + C. Glabrata.  Pharmacy has been consulted for Zosyn and Eraxis dosing.  02/07/2019:  Day #5 Zosyn, Day#3 Eraxis  Afebrile  CKD- renal function stable, CrCl>69ml/min  Plan: Continue Zosyn 3.375g IV q8h (each dose infused over 4 hours).  Continue Eraxis 100mg  IV daily Monitor renal function, cultures, clinical course.  Height: 5\' 2"  (157.5 cm) Weight: 147 lb 7.8 oz (66.9 kg) IBW/kg (Calculated) : 50.1  Temp (24hrs), Avg:98.7 F (37.1 C), Min:98.1 F (36.7 C), Max:99.5 F (37.5 C)  Recent Labs  Lab 02/02/19 1815  02/02/19 1900  02/02/19 2149  02/03/19 2235 02/04/19 0120  02/04/19 1125  02/05/19 0230  02/05/19 1843 02/06/19 0147 02/06/19 0725 02/06/19 1535 02/07/19 0348  WBC  --   --   --    < >  --    < > 6.4 5.9  --   --   --  5.5  --   --  3.0*  --   --  3.3*  CREATININE  --    < >  --    < >  --    < > 2.06* 2.22*   < >  --    < >  --    < > 2.59* 2.66* 2.63* 2.55* 2.52*  LATICACIDVEN 2.8*  --  1.3  --  2.2*  --   --  7.3*  --  5.1*  --   --   --   --   --   --   --   --    < > = values in this interval not displayed.    Estimated Creatinine Clearance: 22.1 mL/min (A) (by C-G formula based on SCr of 2.52 mg/dL (H)).    Allergies  Allergen Reactions  . Bee Venom Swelling    Swells up with bee stings   . Metformin And Related Other (See Comments)    Upset stomach    Antimicrobials this admission: 11/22 Vancomycin >> 11/22 Cefepime x 1 11/22 Metronidazole x 1 11/22 Fluconazole >> 11/24 11/23 Zosyn >> 11/24 eraxis >>  Dose adjustments this admission: --   Microbiology results: 11/22 BCx x1: 1/2 yeast (BCID + C glabrata) 11/22 UCx: Klebsiella (R to ampicillin, I to nitro) 11/22 COVID: negative 11/23 peritoneal culture: C.albicans  11/23 MRSA PCR: negative 11/24 BCx x2: NGTD    Thank you  for allowing pharmacy to be a part of this patient's care.   Netta Cedars, PharmD, BCPS 02/07/2019 8:34 AM

## 2019-02-07 NOTE — Progress Notes (Signed)
eLink Physician-Brief Progress Note Patient Name: Gina [name] DOB: [birth year] MRN: [id number]   Date of Service  02/07/2019  HPI/Events of Note  Blood sugar 55 mg % despite D 10 W infusion at 50 ml/day  eICU Interventions  D 10 W infusion rate increased to 75 ml/ hour        [name] U [name] 02/07/2019, 5:06 AM

## 2019-02-07 NOTE — Progress Notes (Signed)
NAME:  Gina [name], MRN:  [id number], DOB:  1960-08-28, LOS: 5 ADMISSION DATE:  02/02/2019, CONSULTATION DATE:  02/07/19  REFERRING MD:  Sherwood Gambler, CHIEF COMPLAINT:  Abdominal pain    History of present illness   Gina [name] is a 58 y.o. 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 In the ED, she was hypothermic and severely hypotensive. Initial blood glucose >1000.  CT abdomen showed pneumoperitoneum and mesenteric ischemia. She underwent exploratory Laparotomy with finding of perforated prepyloric ulcer and 3.1 L of contaminated peritoneal fluid  Past Medical History  Poorly controlled DM2 with diabetic foot wounds s/p b/l BKA Recent left phalanx amputation HTN Schizophrenia   Consults:  General Surgery (Dr. Lucia Gaskins)  Procedures:  11/23 >> ex lap with oversewing of duodenal ulcer with [name] patch , pyloric channel ulcer perforation with 3.1 L of contaminated peritoneal fluid, no evidence of small bowel ischemia  11/24 >> Ophtho eval negative for endophthalmitis   LDA RIJ 11/22 >> Lt radial a line 11/22 >>11/23 RT JP 11/22 >>  Significant Diagnostic Tests:  CT a/p without contrast 11/22 >> findings consistent with hollow viscus perforation and extensive pneumatosis involving multiple loops of small bowel concerning for mesenteric ischemia. The scan also noted cholelithiasis without cholecystitis and bibasilar airspace disease.  CT head 11/22 neg  Micro Data:  Blood 11/22 >> Candida glabrata urine  11/22 >> klebsiella Peritoneal fluid 11/23>>Candida albicans [facility] 11/24 >> pending  Antimicrobials:  Cefepime 11/22 (1 dose) Metronidazole 11/22 (1 dose) Vancomycin 11/22 >>11/23 Fluconazole 11/22 >>11/24 Zosyn 11/23 >> Anidulafungin 11/24 >>   SUBJ - Weaned off  vasopressin. On levophed 5 mcg/hr. Follows commands. Tolerating PSV   Objective   Blood pressure (!) 113/57, pulse 84, temperature 98.2 F (36.8 C), resp. rate 16, height 5\' 2"  (1.575 m), weight 66.9 kg, last menstrual period 03/29/2015, SpO2 100 %.    Vent Mode: PSV FiO2 (%):  [30 %] 30 % Set Rate:  [18 bmp-25 bmp] 18 bmp Vt Set:  [350 mL-400 mL] 350 mL PEEP:  [5 cmH20] 5 cmH20 Pressure Support:  [10 cmH20] 10 cmH20 Plateau Pressure:  [17 cmH20-20 cmH20] 17 cmH20   Intake/Output Summary (Last 24 hours) at 02/07/2019 1056 Last data filed at 02/07/2019 0930 Gross per 24 hour  Intake 3304.6 ml  Output 2290 ml  Net 1014.6 ml   Filed Weights   02/05/19 0423 02/06/19 0436 02/07/19 0500  Weight: 66.3 kg 65.1 kg 66.9 kg   Physical Exam: General: Chronically ill-appearing, on mechanical ventilation HENT: [place], AT, ETT in place Eyes: EOMI, no scleral icterus Respiratory: Vented breath sounds.  No crackles, wheezing or rales Cardiovascular: RRR, -M/R/G, no JVD GI: BS+, soft, nontender, JP drain in place Extremities:S/p bilateral BKS with hyperpigmented dusky region around the stumps that are unchanged compared to yesterday, s/p 3rd digit amputation of left hand, blistering on dorsal aspect of left hand Neuro: AAO x4, CNII-XII grossly intact  Assessment & Plan:   58 year old female with fllorid septic shock due to gastric ulcer perforation and peritonitis complicated by DKA and Candida glabrata fungemia  Septic shock 2/2 peritonitis in setting of perforated prepyloric ulcer, Candida glabrata fungemia and Klebsiella UTI Ischemic bowel suggested by imaging but no evidence Intra-Op S/p ex-lap with oversew of duodenal ulcer 02/03/19 Plan: -Give 1L NS bolus -  Wean off levophed for SBP >100 -Contine Zosyn & anidulafungin  -Remain NPO. Will start TNP when fungemia resolves -Drain management and wound managment per Surgery -Follow-up final intra-op cultures - Candida albicans -Follow-up  blood cultures 11/24 for clearance of fungemia -Consider CT A/P if febrile or worsening leukocytosis  Acute respiratory failure  Respiratory alkalosis Tolerating PS today. Extubation precluded by critical illness (remains on vasopressor support) Plan: PSV as tolerated Daily SBT and WUA Full vent support. Decrease TV to 6cc/kg  DKA with AGA Hypoglycemia 11/23 -DC sodium bicarbonate -Continue D10 to avoid hypoglycemia -Changed to sensitive slide scale -Trend BMP  AKI -prerenal versus ATN  Improved UOP. Baseline creatinine ~0.50 Plan: -IVF as above -Strict I/O, insert and maintain Foley -Avoid nephrotoxins -Renally dose meds for CrCl <50  Hypokalemia Hypomagnesemia Plan: Replete electrolytes  Protein -calorie malnutrition Plan: -Continue D10 -Will plan for TNA when fungemia resolves. 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)  Surgical team following to see when sutures can be removed Weaning pressors pressors to avoid ischemia of limbs  Resolved problems- Neutropenia-transient due to  overwhelming sepsis DKA   Best practice:  Diet: NPO, D10 gtt  Pain/Anxiety/Delirium protocol (if indicated): Fent VAP protocol (if indicated): Chlorhexidine DVT prophylaxis: SQH GI prophylaxis: PPI Glucose control: CBG q4h Mobility: Bed rest Code Status: Full Family Communication: Updated Daughter, Gina [name] [phone]) on 10/27 Disposition: ICU  The patient is critically ill with multiple organ systems failure and requires high complexity decision making for assessment and support, frequent evaluation and titration of therapies, application of advanced monitoring technologies and extensive interpretation of multiple databases.   Critical Care Time devoted to patient care services described in this note is 38  Minutes. This time reflects time of care of this signee Dr. Rodman Pickle.  Rodman Pickle, M.D. [facility] Pulmonary/Critical Care Medicine 02/07/2019 10:56 AM  Pager: [phone] After hours pager: [phone]

## 2019-02-07 NOTE — Progress Notes (Signed)
ABG Obtained on PRVC 350, RR 18, Peep 5 and 30% FIO2  Results for Gina [name], Gina [name] (MRN [id number]) as of 02/07/2019 04:07  Ref. Range 02/07/2019 03:40  FIO2 Unknown 30.00  pH, Arterial Latest Ref Range: 7.350 - 7.450  7.519 (H)  pCO2 arterial Latest Ref Range: 32.0 - 48.0 mmHg 36.2  pO2, Arterial Latest Ref Range: 83.0 - 108.0 mmHg 160 (H)  Acid-Base Excess Latest Ref Range: 0.0 - 2.0 mmol/L 6.2 (H)  Bicarbonate Latest Ref Range: 20.0 - 28.0 mmol/L 29.5 (H)  Patient temperature Unknown 97.7  Allens test (pass/fail) Latest Ref Range: PASS  PASS

## 2019-02-07 NOTE — Progress Notes (Signed)
Inpatient Diabetes Program Recommendations  AACE/ADA: New Consensus Statement on Inpatient Glycemic Control (2015)  Target Ranges:  Prepandial:   less than 140 mg/dL      Peak postprandial:   less than 180 mg/dL (1-2 hours)      Critically ill patients:  140 - 180 mg/dL   Results for Gina [name], Gina [name] (MRN [id number]) as of 02/07/2019 07:57  Ref. Range 02/06/2019 07:29 02/06/2019 11:40 02/06/2019 11:45 02/06/2019 16:29 02/06/2019 16:31 02/06/2019 16:32 02/06/2019 16:51  Glucose-Capillary Latest Ref Range: 70 - 99 mg/dL 103 (H) 350 (H) 152 (H)  4 units NOVOLOG  48 (L) 14 (LL) 69 (L) 121 (H)   Results for Gina [name], Gina [name] (MRN [id number]) as of 02/07/2019 07:57  Ref. Range 02/06/2019 23:30 02/07/2019 03:56 02/07/2019 04:29  Glucose-Capillary Latest Ref Range: 70 - 99 mg/dL 169 (H)  4 units NOVOLOG  55 (L) 123 (H)     Home DM Meds: Lantus 35 units Daily       Novolog 0-15 units TID per SSI   Current Orders: Novolog 2-4-6 units Q4 hours (per the ICU Glycemic Control Protocol order set)     Remains Intubated.   Getting D10% IVF running at 75cc/hr--Rate increased to 75cc/hr early this AM at 5am.      MD- Hypoglycemic events yesterday and this AM after getting 4 units Novolog per the 2-4-6 unit scale   Please consider reducing the Novolog Correction scale (SSI) to the Sensitive scale (1-2-3 units) Q4 hours  Creatinine 2.52 mg/dl this AM     --Will follow patient during hospitalization--  Wyn Quaker RN, MSN, CDE Diabetes Coordinator Inpatient Glycemic Control Team Team Pager: [phone] (8a-5p)

## 2019-02-07 NOTE — Progress Notes (Signed)
eLink Physician-Brief Progress Note Patient Name: Gina [name] DOB: 1960-08-22 MRN: [id number]   Date of Service  02/07/2019  HPI/Events of Note  MAP's have been mostly above 70 mmHg  eICU Interventions  Vasopressin discontinued.        Kerry Kass Ogan 02/07/2019, 12:29 AM

## 2019-02-08 DIAGNOSIS — A419 Sepsis, unspecified organism: Secondary | ICD-10-CM | POA: Diagnosis not present

## 2019-02-08 DIAGNOSIS — J9601 Acute respiratory failure with hypoxia: Secondary | ICD-10-CM | POA: Diagnosis not present

## 2019-02-08 DIAGNOSIS — N179 Acute kidney failure, unspecified: Secondary | ICD-10-CM | POA: Diagnosis not present

## 2019-02-08 DIAGNOSIS — K251 Acute gastric ulcer with perforation: Secondary | ICD-10-CM | POA: Diagnosis not present

## 2019-02-08 LAB — BASIC METABOLIC PANEL
Anion gap: 14 (ref 5–15)
BUN: 46 mg/dL — ABNORMAL HIGH (ref 6–20)
CO2: 26 mmol/L (ref 22–32)
Calcium: 7.3 mg/dL — ABNORMAL LOW (ref 8.9–10.3)
Chloride: 89 mmol/L — ABNORMAL LOW (ref 98–111)
Creatinine, Ser: 2.38 mg/dL — ABNORMAL HIGH (ref 0.44–1.00)
GFR calc Af Amer: 25 mL/min — ABNORMAL LOW (ref >60)
GFR calc non Af Amer: 22 mL/min — ABNORMAL LOW (ref >60)
Glucose, Bld: 234 mg/dL — ABNORMAL HIGH (ref 70–99)
Potassium: 3.1 mmol/L — ABNORMAL LOW (ref 3.5–5.1)
Sodium: 129 mmol/L — ABNORMAL LOW (ref 135–145)

## 2019-02-08 LAB — CBC
HCT: 18.7 % — ABNORMAL LOW (ref 36.0–46.0)
Hemoglobin: 6.1 g/dL — CL (ref 12.0–15.0)
MCH: 29.2 pg (ref 26.0–34.0)
MCHC: 32.6 g/dL (ref 30.0–36.0)
MCV: 89.5 fL (ref 80.0–100.0)
Platelets: 58 10*3/uL — ABNORMAL LOW (ref 150–400)
RBC: 2.09 MIL/uL — ABNORMAL LOW (ref 3.87–5.11)
RDW: 14.8 % (ref 11.5–15.5)
WBC: 4.5 10*3/uL (ref 4.0–10.5)
nRBC: 0 % (ref 0.0–0.2)

## 2019-02-08 LAB — AEROBIC/ANAEROBIC CULTURE W GRAM STAIN (SURGICAL/DEEP WOUND): Gram Stain: NONE SEEN

## 2019-02-08 LAB — GLUCOSE, CAPILLARY
Glucose-Capillary: 192 mg/dL — ABNORMAL HIGH (ref 70–99)
Glucose-Capillary: 180 mg/dL — ABNORMAL HIGH (ref 70–99)
Glucose-Capillary: 166 mg/dL — ABNORMAL HIGH (ref 70–99)
Glucose-Capillary: 112 mg/dL — ABNORMAL HIGH (ref 70–99)
Glucose-Capillary: 176 mg/dL — ABNORMAL HIGH (ref 70–99)

## 2019-02-08 LAB — MAGNESIUM: Magnesium: 2.3 mg/dL (ref 1.7–2.4)

## 2019-02-08 LAB — PREPARE RBC (CROSSMATCH)

## 2019-02-08 LAB — CORTISOL: Cortisol, Plasma: 28.3 ug/dL

## 2019-02-08 LAB — HEMOGLOBIN AND HEMATOCRIT, BLOOD
HCT: 23 % — ABNORMAL LOW (ref 36.0–46.0)
Hemoglobin: 7.5 g/dL — ABNORMAL LOW (ref 12.0–15.0)

## 2019-02-08 MED ORDER — MIDODRINE HCL 5 MG PO TABS
5.0000 mg | ORAL_TABLET | Freq: Three times a day (TID) | ORAL | Status: DC
  Filled 2019-02-08: qty 1

## 2019-02-08 MED ORDER — SODIUM CHLORIDE 0.9% IV SOLUTION: Freq: Once | INTRAVENOUS | Status: DC

## 2019-02-08 MED ORDER — POTASSIUM CHLORIDE 10 MEQ/50ML IV SOLN
10.0000 meq | INTRAVENOUS | Status: AC
  Administered 2019-02-08 (×4): 10 meq via INTRAVENOUS
  Filled 2019-02-08 (×4): qty 50

## 2019-02-08 MED ORDER — SODIUM CHLORIDE 0.9 % IV BOLUS
1000.0000 mL | Freq: Once | INTRAVENOUS | Status: AC
  Administered 2019-02-08: 1000 mL via INTRAVENOUS

## 2019-02-08 NOTE — Progress Notes (Signed)
Patient returned to [facility] at previous settings due to discomfort and low Vt 150-260 mL; wean 5/+5 from 0822-1024. RT will attempt at later time.

## 2019-02-08 NOTE — Progress Notes (Signed)
NAME:  Gina [name], MRN:  [id number], DOB:  21-Feb-1961, LOS: 6 ADMISSION DATE:  02/02/2019, CONSULTATION DATE:  02/08/19  REFERRING MD:  Sherwood Gambler, CHIEF COMPLAINT:  Abdominal pain    History of present illness   Gina [name] is a 58 y.o. 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 In the ED, she was hypothermic and severely hypotensive. Initial blood glucose >1000.  CT abdomen showed pneumoperitoneum and mesenteric ischemia. She underwent exploratory Laparotomy with finding of perforated prepyloric ulcer and 3.1 L of contaminated peritoneal fluid  Past Medical History  Poorly controlled DM2 with diabetic foot wounds s/p b/l BKA Recent left phalanx amputation HTN Schizophrenia   Consults:  General Surgery (Dr. Lucia Gaskins)  Procedures:  11/23 >> ex lap with oversewing of duodenal ulcer with [name] patch , pyloric channel ulcer perforation with 3.1 L of contaminated peritoneal fluid, no evidence of small bowel ischemia  11/24 >> Ophtho eval negative for endophthalmitis   LDA RIJ 11/22 >> Lt radial a line 11/22 >>11/23 RT JP 11/22 >>  Significant Diagnostic Tests:  CT a/p without contrast 11/22 >> findings consistent with hollow viscus perforation and extensive pneumatosis involving multiple loops of small bowel concerning for mesenteric ischemia. The scan also noted cholelithiasis without cholecystitis and bibasilar airspace disease.  CT head 11/22 neg  Micro Data:  Blood 11/22 >> Candida glabrata urine  11/22 >> klebsiella Peritoneal fluid 11/23>>Candida albicans [facility] 11/24 >> pending  Antimicrobials:  Cefepime 11/22 (1 dose) Metronidazole 11/22 (1 dose) Vancomycin 11/22 >>11/23 Fluconazole 11/22 >>11/24 Zosyn 11/23 >> Anidulafungin 11/24 >>   SUBJ - Awake, follows  commands. Still on low-dose pressors. Tolerating PS. Transfused 1 PRBC for Hg 6.1.  Objective   Blood pressure (!) 106/48, pulse 87, temperature 98.6 F (37 C), resp. rate (!) 21, height 5\' 2"  (1.575 m), weight 66.5 kg, last menstrual period 03/29/2015, SpO2 99 %.    Vent Mode: PRVC FiO2 (%):  [30 %] 30 % Set Rate:  [18 bmp] 18 bmp Vt Set:  [300 mL] 300 mL PEEP:  [5 cmH20] 5 cmH20 Pressure Support:  [10 cmH20] 10 cmH20 Plateau Pressure:  [13 cmH20-17 cmH20] 13 cmH20   Intake/Output Summary (Last 24 hours) at 02/08/2019 0825 Last data filed at 02/08/2019 0600 Gross per 24 hour  Intake 2994.12 ml  Output 3601 ml  Net -606.88 ml   Filed Weights   02/06/19 0436 02/07/19 0500 02/08/19 0500  Weight: 65.1 kg 66.9 kg 66.5 kg   Physical Exam: General: Chronically ill-appearing, thin, on mechanical ventilation HENT: [place], AT, ETT in place Eyes: EOMI, no scleral icterus Respiratory: Clear to auscultation bilaterally.  No crackles, wheezing or rales Cardiovascular: RRR, -M/R/G, no JVD GI: BS+, soft, nontender, JP drain full of dark non-bloody fluid Extremities: S/p bilateral BKA with hyperpigmented dusky areas around the stumps that remain unchanged, no further extension. S/p 3rd digit amputation of the left hand Neuro: Awake and alert, CNII-XII grossly intact, moves extremities x 4  Assessment & Plan:   58 year old female with fllorid septic shock due to gastric ulcer perforation and peritonitis complicated by DKA and Candida glabrata fungemia  Septic shock 2/2 peritonitis in setting of perforated prepyloric ulcer, Candida glabrata fungemia and Klebsiella UTI Ischemic bowel suggested by imaging but no evidence Intra-Op S/p ex-lap with oversew of duodenal  ulcer 02/03/19. Continues to remain on low-dose pressor Plan: -Give 1L NS bolus -Wean off levophed for SBP >100 -Contine Zosyn & anidulafungin  -Remain NPO. Will start TNP when fungemia resolves -Drain management and wound managment  per Surgery -Follow-up final intra-op cultures - Candida albicans -Follow-up blood cultures 11/24 for clearance of fungemia -Repeat blood cultures for persistent hypotension -Checking random cortisol -Start midodrine 5 mg TID  Acute respiratory failure  Tolerating PS Plan: PRVC with TV 6cc/kg PSV as tolerated Daily SBT and WUA  Acute blood loss anemia likely critical illness, iatrogenic from daily blood draws Plan: Transfuse 1 U PRBC  AKI -prerenal versus ATN  Baseline creatinine ~0.50. Excellent UOP after fluid bolus Plan: -IVF as above -Strict I/O, insert and maintain Foley -Avoid nephrotoxins -Renally dose meds for CrCl <50  Hypokalemia Hypomagnesemia Plan: Replete potassium Trend K and Mg  Protein -calorie malnutrition Plan: -Continue D10 -Will plan for TNA when fungemia resolves. 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)  Surgical team following to see when sutures can be removed Weaning pressors pressors to avoid ischemia of limbs  Resolved problems- Neutropenia-transient due to  overwhelming sepsis DKA  Best practice:  Diet: NPO, D10 gtt  Pain/Anxiety/Delirium protocol (if indicated): Fent VAP protocol (if indicated): Chlorhexidine DVT prophylaxis: SQH GI prophylaxis: PPI Glucose control: CBG q4h Mobility: Bed rest Code Status: Full Family Communication: Updated Daughter, [name] [phone]) on 11/28 Disposition: ICU  The patient is critically ill with multiple organ systems failure and requires high complexity decision making for assessment and support, frequent evaluation and titration of therapies, application of advanced monitoring technologies and extensive interpretation of multiple databases.   Critical Care Time devoted to patient care services described in this note is 33  Minutes.   Rodman Pickle, M.D. [facility] Pulmonary/Critical Care Medicine 02/08/2019 8:26 AM   Please see Amion for pager number to reach on-call Pulmonary and Critical Care Team.

## 2019-02-08 NOTE — Progress Notes (Signed)
eLink Physician-Brief Progress Note Patient Name: Gina [name] DOB: 1960-12-14 MRN: [id number]   Date of Service  02/08/2019  HPI/Events of Note  Notified of H/H 6.1/18.7 from 7/20.2. No report of active bleeding. Patient remains on norepinephrine  eICU Interventions  Ordered to transfuse 1 unit PRBC     Intervention Category Intermediate Interventions: Bleeding - evaluation and treatment with blood products  Judd Lien 02/08/2019, 5:38 AM

## 2019-02-08 NOTE — Progress Notes (Signed)
Patient ID: Gina [name], female   DOB: 02-22-1961, 58 y.o.   MRN: [id number]  6 Days Post-Op  Subjective: Awake and alert on vent  Objective: Vital signs in last 24 hours: Temp:  [97.9 F (36.6 C)-98.6 F (37 C)] 98.6 F (37 C) (11/28 0600) Pulse Rate:  [84-92] 86 (11/28 0600) Resp:  [6-24] 24 (11/28 0600) BP: (83-140)/(33-69) 106/48 (11/28 0600) SpO2:  [100 %] 100 % (11/28 0400) FiO2 (%):  [30 %] 30 % (11/28 0400) Weight:  [66.5 kg] 66.5 kg (11/28 0500) Last BM Date: 02/07/19  Intake/Output from previous day: 11/27 0701 - 11/28 0700 In: 3178.3 [I.V.:1732.7; IV Piggyback:1445.6] Out: [id number] [Urine:2800; Emesis/NG output:600; Drains:150; Stool:1] Intake/Output this shift: Total I/O In: 1022.4 [I.V.:874; IV Piggyback:148.4] Out: 1776 [Urine:1675; Emesis/NG output:100; Stool:1]  General appearance: alert and delirious Resp: clear to auscultation bilaterally and on vent Cardio: regular rate and rhythm GI: soft, nontender. drain output dark and watery. having bm's  Lab Results:  Recent Labs    02/07/19 0348 02/08/19 0409  WBC 3.3* 4.5  HGB 7.0* 6.1*  HCT 20.2* 18.7*  PLT 31* 58*   BMET Recent Labs    02/07/19 1609 02/08/19 0409  NA 129* 129*  K 3.3* 3.1*  CL 89* 89*  CO2 25 26  GLUCOSE 169* 234*  BUN 51* 46*  CREATININE 2.49* 2.38*  CALCIUM 6.9* 7.3*   PT/INR No results for input(s): LABPROT, INR in the last 72 hours. ABG Recent Labs    02/06/19 1700 02/07/19 0340  PHART 7.530* 7.519*  HCO3 29.8* 29.5*    Studies/Results: No results found.  Anti-infectives: Anti-infectives (From admission, onward)   Start     Dose/Rate Route Frequency Ordered Stop   02/05/19 1000  anidulafungin (ERAXIS) 100 mg in sodium chloride 0.9 % 100 mL IVPB     100 mg 78 mL/hr over 100 Minutes Intravenous Every 24 hours 02/04/19 0840     02/04/19 1000  anidulafungin (ERAXIS) 200 mg in sodium chloride 0.9 % 200 mL IVPB     200 mg 78 mL/hr over 200 Minutes Intravenous   Once 02/04/19 0840 02/04/19 1301   02/04/19 0200  fluconazole (DIFLUCAN) IVPB 200 mg  Status:  Discontinued     200 mg 100 mL/hr over 60 Minutes Intravenous Every 24 hours 02/02/19 2302 02/04/19 0840   02/03/19 1800  vancomycin (VANCOCIN) IVPB 750 mg/150 ml premix  Status:  Discontinued     750 mg 150 mL/hr over 60 Minutes Intravenous Every 24 hours 02/03/19 0241 02/03/19 0242   02/03/19 1800  vancomycin (VANCOCIN) IVPB 750 mg/150 ml premix  Status:  Discontinued     750 mg 150 mL/hr over 60 Minutes Intravenous Every 24 hours 02/03/19 0242 02/03/19 0858   02/03/19 0600  piperacillin-tazobactam (ZOSYN) IVPB 3.375 g     3.375 g 12.5 mL/hr over 240 Minutes Intravenous Every 8 hours 02/03/19 0243     02/02/19 2315  fluconazole (DIFLUCAN) IVPB 400 mg     400 mg 100 mL/hr over 120 Minutes Intravenous STAT 02/02/19 2301 02/03/19 0218   02/02/19 1800  ceFEPIme (MAXIPIME) 2 g in sodium chloride 0.9 % 100 mL IVPB     2 g 200 mL/hr over 30 Minutes Intravenous  Once 02/02/19 1745 02/02/19 2030   02/02/19 1800  metroNIDAZOLE (FLAGYL) IVPB 500 mg     500 mg 100 mL/hr over 60 Minutes Intravenous  Once 02/02/19 1745 02/02/19 2237   02/02/19 1800  vancomycin (VANCOCIN) IVPB 1000 mg/200 mL premix  Status:  Discontinued     1,000 mg 200 mL/hr over 60 Minutes Intravenous  Once 02/02/19 1745 02/02/19 1747   02/02/19 1800  vancomycin (VANCOCIN) 1,250 mg in sodium chloride 0.9 % 250 mL IVPB     1,250 mg 166.7 mL/hr over 90 Minutes Intravenous  Once 02/02/19 1747 02/02/19 2237      Assessment/Plan: s/p Procedure(s): EXPLORATORY LAPAROTOMY WITH OVERSEW OF DUODENAL ULCER Need to start tpn soon for nutrition support  Continue abx and drain  VDRF per CCM Will need tpn for nutrition support in the near future VDRF per CCM Poorly controlled DM2 - A1c15.2 S/p bilateral BKA- both ischemic in appearance CKD - creatinine 2.06>>2.65>>2.72>>2.63 HTN Schizophrenia VDRF DKA AKI, oliguria Leukopenia  Severe malnutrition Anemia  Hypomagnesemia - defer to CCM 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 Lucia Gaskins - POD#4 - intraop culture pending - Blood cultures growing Candida Glabrata - BID wet to dry dressing changes to midline abdominal wound - continue NPO, NGT to LIWS - protonix BID - monitor JP drain output  ID - currently vancomycin/diflucan 11/23; Eraxis 11/24>> day 4; Zosyn 11/23 >>day 5 FEN - NPO/NGT, prealbumin 6.3/IV fluids VTE -NoSCDs,Ischemic Bilateral BKa's - (sq heparin stopped 02/04/19) Foley - in place Follow up - TBD  Plan: Dr. Elsworth Soho discussed care with Dr. Marcello Moores. They are going to place her on TNA but not immediately because of her positive blood cultures. We will continue to watch the drainage from the [place]. We would not be surprised if she is still leaking, the color most likely from bile drainage. Continue supportive care, and appreciate CCM's help with her care.  LOS: 6 days    [name] 02/08/2019

## 2019-02-08 NOTE — Progress Notes (Signed)
CRITICAL VALUE ALERT  Critical Value:  Hgb 6.1  Date & Time Notified:  02/08/2019 0509  Provider Notified: E-Link  Orders Received/Actions taken: Awaiting potential new orders

## 2019-02-09 ENCOUNTER — Inpatient Hospital Stay (HOSPITAL_COMMUNITY): Payer: Medicare HMO

## 2019-02-09 DIAGNOSIS — K251 Acute gastric ulcer with perforation: Secondary | ICD-10-CM | POA: Diagnosis not present

## 2019-02-09 DIAGNOSIS — N179 Acute kidney failure, unspecified: Secondary | ICD-10-CM | POA: Diagnosis not present

## 2019-02-09 DIAGNOSIS — J9601 Acute respiratory failure with hypoxia: Secondary | ICD-10-CM | POA: Diagnosis not present

## 2019-02-09 DIAGNOSIS — A419 Sepsis, unspecified organism: Secondary | ICD-10-CM | POA: Diagnosis not present

## 2019-02-09 LAB — BASIC METABOLIC PANEL
Anion gap: 11 (ref 5–15)
BUN: 41 mg/dL — ABNORMAL HIGH (ref 6–20)
CO2: 26 mmol/L (ref 22–32)
Calcium: 7.2 mg/dL — ABNORMAL LOW (ref 8.9–10.3)
Chloride: 96 mmol/L — ABNORMAL LOW (ref 98–111)
Creatinine, Ser: 2.01 mg/dL — ABNORMAL HIGH (ref 0.44–1.00)
GFR calc Af Amer: 31 mL/min — ABNORMAL LOW (ref >60)
GFR calc non Af Amer: 27 mL/min — ABNORMAL LOW (ref >60)
Glucose, Bld: 251 mg/dL — ABNORMAL HIGH (ref 70–99)
Potassium: 3.2 mmol/L — ABNORMAL LOW (ref 3.5–5.1)
Sodium: 133 mmol/L — ABNORMAL LOW (ref 135–145)

## 2019-02-09 LAB — CULTURE, BLOOD (ROUTINE X 2)
Culture: NO GROWTH
Culture: NO GROWTH

## 2019-02-09 LAB — ANTIFUNGAL AST 9 DRUG PANEL
Amphotericin B MIC: 1
Fluconazole Islt MIC: 16
Flucytosine MIC: 0.06
Itraconazole MIC: 1
Posaconazole MIC: 2
Voriconazole MIC: 0.5

## 2019-02-09 LAB — GLUCOSE, CAPILLARY
Glucose-Capillary: 130 mg/dL — ABNORMAL HIGH (ref 70–99)
Glucose-Capillary: 160 mg/dL — ABNORMAL HIGH (ref 70–99)
Glucose-Capillary: 164 mg/dL — ABNORMAL HIGH (ref 70–99)
Glucose-Capillary: 168 mg/dL — ABNORMAL HIGH (ref 70–99)
Glucose-Capillary: 174 mg/dL — ABNORMAL HIGH (ref 70–99)
Glucose-Capillary: 249 mg/dL — ABNORMAL HIGH (ref 70–99)
Glucose-Capillary: 254 mg/dL — ABNORMAL HIGH (ref 70–99)

## 2019-02-09 LAB — CBC
HCT: 23.1 % — ABNORMAL LOW (ref 36.0–46.0)
Hemoglobin: 7.6 g/dL — ABNORMAL LOW (ref 12.0–15.0)
MCH: 29 pg (ref 26.0–34.0)
MCHC: 32.9 g/dL (ref 30.0–36.0)
MCV: 88.2 fL (ref 80.0–100.0)
Platelets: 78 10*3/uL — ABNORMAL LOW (ref 150–400)
RBC: 2.62 MIL/uL — ABNORMAL LOW (ref 3.87–5.11)
RDW: 15 % (ref 11.5–15.5)
WBC: 6.2 10*3/uL (ref 4.0–10.5)
nRBC: 0 % (ref 0.0–0.2)

## 2019-02-09 LAB — POTASSIUM: Potassium: 3.7 mmol/L (ref 3.5–5.1)

## 2019-02-09 LAB — MAGNESIUM: Magnesium: 2.1 mg/dL (ref 1.7–2.4)

## 2019-02-09 MED ORDER — POTASSIUM CHLORIDE 10 MEQ/100ML IV SOLN
10.0000 meq | Freq: Once | INTRAVENOUS | Status: AC
  Administered 2019-02-09: 10 meq via INTRAVENOUS
  Filled 2019-02-09: qty 100

## 2019-02-09 MED ORDER — HEPARIN SODIUM (PORCINE) 5000 UNIT/ML IJ SOLN
5000.0000 [IU] | Freq: Three times a day (TID) | INTRAMUSCULAR | Status: DC
  Administered 2019-02-09 – 2019-02-26 (×51): 5000 [IU] via SUBCUTANEOUS
  Filled 2019-02-09 (×51): qty 1

## 2019-02-09 MED ORDER — POTASSIUM CHLORIDE 10 MEQ/50ML IV SOLN
10.0000 meq | INTRAVENOUS | Status: AC
  Administered 2019-02-09 (×3): 10 meq via INTRAVENOUS
  Filled 2019-02-09 (×3): qty 50

## 2019-02-09 MED ORDER — LACTATED RINGERS IV BOLUS
500.0000 mL | Freq: Once | INTRAVENOUS | Status: AC
  Administered 2019-02-09: 500 mL via INTRAVENOUS

## 2019-02-09 MED ORDER — LIP MEDEX EX OINT
TOPICAL_OINTMENT | CUTANEOUS | Status: DC | PRN
  Filled 2019-02-09 (×3): qty 7

## 2019-02-09 NOTE — Progress Notes (Signed)
Patient ID: Gina [name], female   DOB: [birth year], 58 y.o.   MRN: [id number]  7 Days Post-Op  Subjective: Awake and alert on vent. Wants to talk but hard to understand with tube  Objective: Vital signs in last 24 hours: Temp:  [98.1 F (36.7 C)-100.2 F (37.9 C)] 100.2 F (37.9 C) (11/29 0811) Pulse Rate:  [84-107] 107 (11/29 0811) Resp:  [0-28] 20 (11/29 0811) BP: (79-142)/(34-59) 133/54 (11/29 0811) SpO2:  [100 %] 100 % (11/29 0811) FiO2 (%):  [30 %] 30 % (11/29 0811) Weight:  [65.6 kg] 65.6 kg (11/29 0500) Last BM Date: 02/09/19  Intake/Output from previous day: 11/28 0701 - 11/29 0700 In: 3064.3 [I.V.:1445.5; Blood:347.9; IV Piggyback:1270.9] Out: [id number] [Urine:2200; Emesis/NG output:1400; Drains:615] Intake/Output this shift: Total I/O In: 186.5 [I.V.:133.5; IV Piggyback:53] Out: 325 [Urine:125; Emesis/NG output:200]  General appearance: alert, cooperative and on vent Resp: clear to auscultation bilaterally and on vent Cardio: regular rate and rhythm GI: soft, nontender. drain output looks more serosanguinous  Lab Results:  Recent Labs    02/08/19 0409 02/08/19 1407 02/09/19 0528  WBC 4.5  --  6.2  HGB 6.1* 7.5* 7.6*  HCT 18.7* 23.0* 23.1*  PLT 58*  --  78*   BMET Recent Labs    02/08/19 0409 02/09/19 0528  NA 129* 133*  K 3.1* 3.2*  CL 89* 96*  CO2 26 26  GLUCOSE 234* 251*  BUN 46* 41*  CREATININE 2.38* 2.01*  CALCIUM 7.3* 7.2*   PT/INR No results for input(s): LABPROT, INR in the last 72 hours. ABG Recent Labs    02/06/19 1700 02/07/19 0340  PHART 7.530* 7.519*  HCO3 29.8* 29.5*    Studies/Results: No results found.  Anti-infectives: Anti-infectives (From admission, onward)   Start     Dose/Rate Route Frequency Ordered Stop   02/05/19 1000  anidulafungin (ERAXIS) 100 mg in sodium chloride 0.9 % 100 mL IVPB     100 mg 78 mL/hr over 100 Minutes Intravenous Every 24 hours 02/04/19 0840     02/04/19 1000  anidulafungin (ERAXIS) 200 mg  in sodium chloride 0.9 % 200 mL IVPB     200 mg 78 mL/hr over 200 Minutes Intravenous  Once 02/04/19 0840 02/04/19 1301   02/04/19 0200  fluconazole (DIFLUCAN) IVPB 200 mg  Status:  Discontinued     200 mg 100 mL/hr over 60 Minutes Intravenous Every 24 hours 02/02/19 2302 02/04/19 0840   02/03/19 1800  vancomycin (VANCOCIN) IVPB 750 mg/150 ml premix  Status:  Discontinued     750 mg 150 mL/hr over 60 Minutes Intravenous Every 24 hours 02/03/19 0241 02/03/19 0242   02/03/19 1800  vancomycin (VANCOCIN) IVPB 750 mg/150 ml premix  Status:  Discontinued     750 mg 150 mL/hr over 60 Minutes Intravenous Every 24 hours 02/03/19 0242 02/03/19 0858   02/03/19 0600  piperacillin-tazobactam (ZOSYN) IVPB 3.375 g     3.375 g 12.5 mL/hr over 240 Minutes Intravenous Every 8 hours 02/03/19 0243     02/02/19 2315  fluconazole (DIFLUCAN) IVPB 400 mg     400 mg 100 mL/hr over 120 Minutes Intravenous STAT 02/02/19 2301 02/03/19 0218   02/02/19 1800  ceFEPIme (MAXIPIME) 2 g in sodium chloride 0.9 % 100 mL IVPB     2 g 200 mL/hr over 30 Minutes Intravenous  Once 02/02/19 1745 02/02/19 2030   02/02/19 1800  metroNIDAZOLE (FLAGYL) IVPB 500 mg     500 mg 100 mL/hr over 60 Minutes Intravenous  Once 02/02/19 1745 02/02/19 2237   02/02/19 1800  vancomycin (VANCOCIN) IVPB 1000 mg/200 mL premix  Status:  Discontinued     1,000 mg 200 mL/hr over 60 Minutes Intravenous  Once 02/02/19 1745 02/02/19 1747   02/02/19 1800  vancomycin (VANCOCIN) 1,250 mg in sodium chloride 0.9 % 250 mL IVPB     1,250 mg 166.7 mL/hr over 90 Minutes Intravenous  Once 02/02/19 1747 02/02/19 2237      Assessment/Plan: s/p Procedure(s): EXPLORATORY LAPAROTOMY WITH OVERSEW OF DUODENAL ULCER will need tpn for nutrition support  Consider studying stomach tomorrow to look for leak Continue abx and drain VDRF per CCM Will need tpn for nutrition support in the near future VDRF per CCM Poorly controlled DM2 - A1c15.2 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 Lucia Gaskins - POD#4 - intraop culture pending - Blood cultures growing Candida Glabrata - BID wet to dry dressing changes to midline abdominal wound - continue NPO, NGT to LIWS - protonix BID - monitor JP drain output  ID - currently vancomycin/diflucan 11/23; Eraxis 11/24>> day 5; Zosyn 11/23 >>day 6 FEN - NPO/NGT, prealbumin 6.3/IV fluids VTE -NoSCDs,Ischemic Bilateral BKa's - (sq heparin stopped 02/04/19) Foley - in place Follow up - TBD  Plan: Dr. Elsworth Soho discussed care with Dr. Marcello Moores. They are going to place her on TNA but not immediately because of her positive blood cultures. We will continue to watch the drainage from the [place]. We would not be surprised if she is still leaking, the color most likely from bile drainage. Continue supportive care, and appreciate CCM's help with her care.  LOS: 7 days    Gina [name] 02/09/2019

## 2019-02-09 NOTE — Progress Notes (Addendum)
NAME:  Gina [name], MRN:  [id number], DOB:  25-Jul-1960, LOS: 7 ADMISSION DATE:  02/02/2019, CONSULTATION DATE:  02/09/19  REFERRING MD:  Sherwood Gambler, CHIEF COMPLAINT:  Abdominal pain    History of present illness   Gina [name] is a 58 y.o. 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 In the ED, she was hypothermic and severely hypotensive. Initial blood glucose >1000.  CT abdomen showed pneumoperitoneum and mesenteric ischemia. She underwent exploratory Laparotomy with finding of perforated prepyloric ulcer and 3.1 L of contaminated peritoneal fluid  Past Medical History  Poorly controlled DM2 with diabetic foot wounds s/p b/l BKA Recent left phalanx amputation HTN Schizophrenia   Consults:  General Surgery (Dr. Lucia Gaskins)  Procedures:  11/23 >> ex lap with oversewing of duodenal ulcer with [name] patch , pyloric channel ulcer perforation with 3.1 L of contaminated peritoneal fluid, no evidence of small bowel ischemia  11/24 >> Ophtho eval negative for endophthalmitis   LDA RIJ 11/22 >> Lt radial a line 11/22 >>11/23 RT JP 11/22 >>  Significant Diagnostic Tests:  CT a/p without contrast 11/22 >> findings consistent with hollow viscus perforation and extensive pneumatosis involving multiple loops of small bowel concerning for mesenteric ischemia. The scan also noted cholelithiasis without cholecystitis and bibasilar airspace disease.  CT head 11/22 neg  Micro Data:  Blood 11/22 >> Candida glabrata urine  11/22 >> klebsiella Peritoneal fluid 11/23>>Candida albicans BC 11/24 >> negative  Antimicrobials:  Cefepime 11/22 (1 dose) Metronidazole 11/22 (1 dose) Vancomycin 11/22 >>11/23 Fluconazole 11/22 >>11/24 Zosyn 11/23 >> Anidulafungin 11/24 >>  Interval History     SUBJ - No events overnight. Tolerating SBT this morning. Extubated without event. Weaned of levophed  Objective   Blood pressure 127/73, pulse (!) 101, temperature 99.3 F (37.4 C), resp. rate (!) 28, height 5\' 2"  (1.575 m), weight 65.6 kg, last menstrual period 03/29/2015, SpO2 100 %.    Vent Mode: PSV;CPAP FiO2 (%):  [30 %] 30 % Set Rate:  [18 bmp] 18 bmp Vt Set:  [300 mL] 300 mL PEEP:  [5 cmH20] 5 cmH20 Pressure Support:  [8 cmH20] 8 cmH20 Plateau Pressure:  [10 cmH20-17 cmH20] 17 cmH20   Intake/Output Summary (Last 24 hours) at 02/09/2019 1010 Last data filed at 02/09/2019 0709 Gross per 24 hour  Intake 1761.66 ml  Output 3695 ml  Net -1933.34 ml   Filed Weights   02/07/19 0500 02/08/19 0500 02/09/19 0500  Weight: 66.9 kg 66.5 kg 65.6 kg   Physical Exam: General: Chronically ill-appearing, no acute distress HENT: [place], AT, OP clear, MMM Eyes: EOMI, no scleral icterus Respiratory: Clear to auscultation bilaterally.  No crackles, wheezing or rales Cardiovascular: RRR, -M/R/G, no JVD GI: BS+, soft, nontender, JP drain in place Extremities: S/p bilateral BKA with hyperpigmented dusky areas around stumps that is unchanged, no further extension. 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  Off  pressor support 11/29. Borderline BP. Plan: -Give 500cc LR bolus -Contine Zosyn & anidulafungin  -Remain NPO. Last negative culture 11/24 final. Consider starting TNP today or tomorrow. Will discuss with Surgery. -Drain management  and wound managment per Surgery -Follow-up final intra-op cultures - Candida albicans  Acute respiratory failure  Tolerating PS Plan: Extubated today Wean supplemental O2 for goal >88%  Acute blood loss anemia likely critical illness, iatrogenic from daily blood draws Plan: Transfuse 1 U PRBC on 11/28 Trend CBC  AKI -prerenal versus ATN . Stage IV CKD based on GFR Baseline creatinine ~0.50. Improving Plan: -IVF as above -Strict I/O, insert and maintain Foley -Avoid nephrotoxins -Renally dose meds for CrCl <50  Hypokalemia Hypomagnesemia Plan: Replete K Trend K and Mg  Protein -calorie malnutrition Plan: -Continue D10 -Will plan for TNA when fungemia resolves. 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)  Surgical team following to see when sutures can be removed Weaning pressors pressors to avoid ischemia of limbs  Resolved problems- Neutropenia-transient due to  overwhelming sepsis DKA  Best practice:  Diet: NPO, D10 gtt  Pain/Anxiety/Delirium protocol (if indicated): Fent VAP protocol (if indicated): Chlorhexidine DVT prophylaxis: SQH GI prophylaxis: PPI Glucose control: CBG q4h Mobility: Bed rest Code Status: Full Family Communication: Updated Daughter, [name] [phone]) on 11/29 Disposition: ICU  The patient is critically ill with multiple organ systems failure and requires high complexity decision making for assessment and support, frequent evaluation and titration of therapies, application of advanced monitoring technologies and extensive interpretation of multiple databases.   Critical Care Time devoted to patient care services described in this note is 39 Minutes. This time reflects time of care of this signee Dr. Rodman Pickle. This critical care time does not reflect procedure time, or teaching time or  supervisory time of PA/NP/Med student/Med Resident etc but could involve care discussion time.  Rodman Pickle, M.D. [facility] Pulmonary/Critical Care Medicine 02/09/2019 11:51 AM   Please see Amion for pager number to reach on-call Pulmonary and Critical Care Team.

## 2019-02-09 NOTE — Progress Notes (Signed)
Patient pulled out NG tube, attempted to reinsert. Unsuccessful. Patient requesting more time before second attempt. Will continue to monitor

## 2019-02-09 NOTE — Procedures (Signed)
Extubation Procedure Note  Patient Details:   Name: Gina [name] DOB: 1960-08-05 MRN: [id number]   Airway Documentation:    Vent end date: 02/09/19 Vent end time: 0856   Evaluation  O2 sats: stable throughout Complications: No apparent complications Patient did tolerate procedure well. Bilateral Breath Sounds: Clear, Diminished   Yes   Pt extubated per MD order. 2 RTs at bedside, cuff leak heard prior to extubation. Pt able to speak and make request appropriately. RN aware. Pt is on 4L [place] with SATs of 100%.  Esperanza Sheets T 02/09/2019, 9:08 AM

## 2019-02-09 NOTE — Progress Notes (Addendum)
PCCM Interval Note  Patient remains off pressor support. Extubated this morning and tolerating [place] well. Septic shock has resolved and no longer has ventilator needs. I have paged Surgery to discuss when to start TPN. Patient has hx of Candida glabrata fungemia this hospitalization and her last negative culture was 11/24. Otherwise patient is stable. Will transfer for [facility] to pick up tomorrow on 11/30.   Rodman Pickle, M.D. [facility] Pulmonary/Critical Care Medicine 02/09/2019 5:32 PM    Addendum: Discussed with Dr. Marlou Starks regarding nutrition. Will start TPN.

## 2019-02-10 ENCOUNTER — Inpatient Hospital Stay (HOSPITAL_COMMUNITY): Payer: Medicare HMO

## 2019-02-10 DIAGNOSIS — E119 Type 2 diabetes mellitus without complications: Secondary | ICD-10-CM

## 2019-02-10 DIAGNOSIS — B377 Candidal sepsis: Secondary | ICD-10-CM | POA: Diagnosis not present

## 2019-02-10 DIAGNOSIS — Z978 Presence of other specified devices: Secondary | ICD-10-CM

## 2019-02-10 DIAGNOSIS — K668 Other specified disorders of peritoneum: Secondary | ICD-10-CM

## 2019-02-10 DIAGNOSIS — R579 Shock, unspecified: Secondary | ICD-10-CM

## 2019-02-10 DIAGNOSIS — R230 Cyanosis: Secondary | ICD-10-CM

## 2019-02-10 DIAGNOSIS — E0810 Diabetes mellitus due to underlying condition with ketoacidosis without coma: Secondary | ICD-10-CM

## 2019-02-10 DIAGNOSIS — K255 Chronic or unspecified gastric ulcer with perforation: Secondary | ICD-10-CM | POA: Diagnosis not present

## 2019-02-10 LAB — COMPREHENSIVE METABOLIC PANEL
ALT: 19 U/L (ref 0–44)
AST: 15 U/L (ref 15–41)
Albumin: 1.2 g/dL — ABNORMAL LOW (ref 3.5–5.0)
Alkaline Phosphatase: 57 U/L (ref 38–126)
Anion gap: 12 (ref 5–15)
BUN: 36 mg/dL — ABNORMAL HIGH (ref 6–20)
CO2: 25 mmol/L (ref 22–32)
Calcium: 7.6 mg/dL — ABNORMAL LOW (ref 8.9–10.3)
Chloride: 99 mmol/L (ref 98–111)
Creatinine, Ser: 1.77 mg/dL — ABNORMAL HIGH (ref 0.44–1.00)
GFR calc Af Amer: 36 mL/min — ABNORMAL LOW (ref >60)
GFR calc non Af Amer: 31 mL/min — ABNORMAL LOW (ref >60)
Glucose, Bld: 143 mg/dL — ABNORMAL HIGH (ref 70–99)
Potassium: 3.1 mmol/L — ABNORMAL LOW (ref 3.5–5.1)
Sodium: 136 mmol/L (ref 135–145)
Total Bilirubin: 0.6 mg/dL (ref 0.3–1.2)
Total Protein: 4.1 g/dL — ABNORMAL LOW (ref 6.5–8.1)

## 2019-02-10 LAB — DIFFERENTIAL
Abs Immature Granulocytes: 0.13 10*3/uL — ABNORMAL HIGH (ref 0.00–0.07)
Basophils Absolute: 0.1 10*3/uL (ref 0.0–0.1)
Basophils Relative: 1 %
Eosinophils Absolute: 0.1 10*3/uL (ref 0.0–0.5)
Eosinophils Relative: 1 %
Immature Granulocytes: 2 %
Lymphocytes Relative: 18 %
Lymphs Abs: 1.1 10*3/uL (ref 0.7–4.0)
Monocytes Absolute: 0.3 10*3/uL (ref 0.1–1.0)
Monocytes Relative: 5 %
Neutro Abs: 4.3 10*3/uL (ref 1.7–7.7)
Neutrophils Relative %: 73 %

## 2019-02-10 LAB — BPAM RBC
Blood Product Expiration Date: 202012292359
ISSUE DATE / TIME: 202011280949
Unit Type and Rh: 5100

## 2019-02-10 LAB — GLUCOSE, CAPILLARY
Glucose-Capillary: 113 mg/dL — ABNORMAL HIGH (ref 70–99)
Glucose-Capillary: 137 mg/dL — ABNORMAL HIGH (ref 70–99)
Glucose-Capillary: 160 mg/dL — ABNORMAL HIGH (ref 70–99)
Glucose-Capillary: 172 mg/dL — ABNORMAL HIGH (ref 70–99)
Glucose-Capillary: 202 mg/dL — ABNORMAL HIGH (ref 70–99)
Glucose-Capillary: 234 mg/dL — ABNORMAL HIGH (ref 70–99)

## 2019-02-10 LAB — CBC
HCT: 22.8 % — ABNORMAL LOW (ref 36.0–46.0)
Hemoglobin: 7.3 g/dL — ABNORMAL LOW (ref 12.0–15.0)
MCH: 28.7 pg (ref 26.0–34.0)
MCHC: 32 g/dL (ref 30.0–36.0)
MCV: 89.8 fL (ref 80.0–100.0)
Platelets: 110 10*3/uL — ABNORMAL LOW (ref 150–400)
RBC: 2.54 MIL/uL — ABNORMAL LOW (ref 3.87–5.11)
RDW: 15.2 % (ref 11.5–15.5)
WBC: 5.9 10*3/uL (ref 4.0–10.5)
nRBC: 0 % (ref 0.0–0.2)

## 2019-02-10 LAB — TYPE AND SCREEN
ABO/RH(D): O POS
Antibody Screen: NEGATIVE
Unit division: 0

## 2019-02-10 LAB — PREALBUMIN: Prealbumin: <5 mg/dL — ABNORMAL LOW (ref 18–38)

## 2019-02-10 LAB — PHOSPHORUS: Phosphorus: 3.2 mg/dL (ref 2.5–4.6)

## 2019-02-10 LAB — MAGNESIUM: Magnesium: 2.1 mg/dL (ref 1.7–2.4)

## 2019-02-10 LAB — TRIGLYCERIDES: Triglycerides: 134 mg/dL (ref <150)

## 2019-02-10 LAB — VITAMIN B12: Vitamin B-12: 5528 pg/mL — ABNORMAL HIGH (ref 180–914)

## 2019-02-10 MED ORDER — POTASSIUM CHLORIDE IN NACL 20-0.9 MEQ/L-% IV SOLN
INTRAVENOUS | Status: DC
  Administered 2019-02-10: 18:00:00 via INTRAVENOUS
  Filled 2019-02-10 (×3): qty 1000

## 2019-02-10 MED ORDER — POTASSIUM CHLORIDE 10 MEQ/100ML IV SOLN
10.0000 meq | INTRAVENOUS | Status: AC
  Administered 2019-02-10 (×4): 10 meq via INTRAVENOUS
  Filled 2019-02-10: qty 100

## 2019-02-10 MED ORDER — TRAVASOL 10 % IV SOLN
INTRAVENOUS | Status: DC
  Administered 2019-02-10: 17:00:00 via INTRAVENOUS
  Filled 2019-02-10: qty 547.2

## 2019-02-10 MED ORDER — IOHEXOL 300 MG/ML  SOLN
100.0000 mL | Freq: Once | INTRAMUSCULAR | Status: AC | PRN
  Administered 2019-02-10: 100 mL

## 2019-02-10 NOTE — Evaluation (Signed)
Occupational Therapy Evaluation Patient Details Name: Gina [name] MRN: [id number] DOB: 1960/08/17 Today's Date: 02/10/2019    History of Present Illness 58 yo female admitted with septic shock, perforated ulcer. S/P ex lap, oversewing of duodenal ulcer 11/23. Extubated 11/29. Hx of bil BKA (2019/2020), DM, schizophrenia   Clinical Impression   Pt admitted with the above.  Pt currently with functional limitations due to the deficits listed below (see OT Problem List).  Pt will benefit from skilled OT to increase their safety and independence with ADL and functional mobility for ADL to facilitate discharge to venue listed below.   Pt in chair upon OT arrival.  Pt very lethargic.  Pt did not A with transfer back to bed. OT and 2 RN's A pt back to bed using sheet to scoot over to bed from chair.  Pt needed increased A getting back to bed than when she got to chair with PT.       Follow Up Recommendations  SNF(pt has refused in the past)    Equipment Recommendations  None recommended by OT    Recommendations for Other Services       Precautions / Restrictions Precautions Precautions: Fall Precaution Comments: bil BKA, NG tube, multiple lines/leads, NPO, R jp drain Restrictions Weight Bearing Restrictions: No      Mobility Bed Mobility Overal bed mobility: Needs Assistance Bed Mobility: Sit to Supine     Supine to sit: HOB elevated;Mod assist;+2 for physical assistance;+2 for safety/equipment Sit to supine: +2 for physical assistance;Total assist   General bed mobility comments: Assist for trunk to upright position and to scoot to EOB utilizing bedpads. Increased time. Once at EOB, pt able to sit with Min guard assist.  Transfers Overall transfer level: Needs assistance   Transfers: Lateral/Scoot Transfers          Lateral/Scoot Transfers: +2 physical assistance;+2 safety/equipment;Total assist General transfer comment: total A of 3.  Pt did not A with transfer at  all.    Balance Overall balance assessment: Needs assistance Sitting-balance support: Bilateral upper extremity supported Sitting balance-Leahy Scale: Zero                                     ADL either performed or assessed with clinical judgement   ADL Overall ADL's : Needs assistance/impaired                                       General ADL Comments: ptdepending with ADL activity this day                  Pertinent Vitals/Pain Pain Assessment: Faces Faces Pain Scale: No hurt Pain Location: Pt unable to state location of discomfort/pain Pain Descriptors / Indicators: Grimacing Pain Intervention(s): Limited activity within patient's tolerance;Repositioned        Extremity/Trunk Assessment Upper Extremity Assessment Upper Extremity Assessment: Generalized weakness   Lower Extremity Assessment Lower Extremity Assessment: (bil BKA)   Cervical / Trunk Assessment Cervical / Trunk Assessment: Normal   Communication Communication Communication: No difficulties   Cognition Arousal/Alertness: Lethargic Behavior During Therapy: Flat affect Overall Cognitive Status: No family/caregiver present to determine baseline cognitive functioning Area of Impairment: Problem solving  Problem Solving: Slow processing;Requires verbal cues;Requires tactile cues General Comments: unable to understand pt's speech at times   General Comments               Home Living Family/patient expects to be discharged to:: Unsure Living Arrangements: Alone   Type of Home: Apartment Home Access: Level entry     Home Layout: One level               Home Equipment: Walker - 2 wheels;Bedside commode;Wheelchair - manual;Shower seat(drop arm BSC)   Additional Comments: pt has history of refusing SNF placement (per chart)      Prior Functioning/Environment Level of Independence: Needs assistance  Gait /  Transfers Assistance Needed: able to transfer independently to [facility] with A/P technique              OT Problem List: Decreased strength;Decreased activity tolerance;Impaired balance (sitting and/or standing);Decreased safety awareness;Decreased knowledge of use of DME or AE      OT Treatment/Interventions: Self-care/ADL training;Patient/family education;DME and/or AE instruction;Therapeutic activities    OT Goals(Current goals can be found in the care plan section) Acute Rehab OT Goals Patient Stated Goal: none stated Time For Goal Achievement: 02/18/19  OT Frequency: Min 2X/week   Barriers to D/C: Decreased caregiver support             AM-PAC OT "6 Clicks" Daily Activity     Outcome Measure Help from another person eating meals?: A Lot Help from another person taking care of personal grooming?: A Lot Help from another person toileting, which includes using toliet, bedpan, or urinal?: Total Help from another person bathing (including washing, rinsing, drying)?: Total Help from another person to put on and taking off regular upper body clothing?: Total Help from another person to put on and taking off regular lower body clothing?: Total 6 Click Score: 8   End of Session Nurse Communication: Mobility status;Need for lift equipment  Activity Tolerance: Patient limited by lethargy Patient left: in bed;with nursing/sitter in room  OT Visit Diagnosis: Muscle weakness (generalized) (M62.81)                Time: [id number] OT Time Calculation (min): 21 min Charges:  OT Evaluation $OT Eval Moderate Complexity: 1 Mod  Kari Baars, [place] [phone] Office- [phone]     [place], Nina 02/10/2019, 2:50 PM

## 2019-02-10 NOTE — Progress Notes (Addendum)
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 Lucia Gaskins - BID wet to dry dressing changes to midline abdominal wound  - continue NPO, NGT to LIWS, output 900 in last 24hrs and bilious, questionable ileus   - protonix BID - monitor JP drain output - serous today and down to 65cc in 24hrs  - UGI today to check for leak   Discussed with Dr. Jacalyn Lefevre - UGI is incomplete and he is going to do a CT scan for evaluation of the ulcer/surgery  Ischemic changes to both LE  Consider contacting Dr. Sharol Given, who did her bilateral BKA's (last on 05/10/2018)  ID - Eraxis 11/24>>    Zosyn 11/23 >>  WBC 5.9, afebrile  FEN - NPO/NGT VTE - NoSCDs,Bilateral BKa's - sq heparin  Foley - in place, DC today Follow up - Dr. Lucia Gaskins  Plan: No TNA 2/2 fungemia.   UGI today to look for leak.   Hopefully can start TF's soon or feed pt.    LOS: 8 days    Subjective: CC: mild abdominal pain  Pt is awake and A&O x 3. She states mild abdominal pain. She endorses flatus. No issues overnight per nurse.   Objective: Vital signs in last 24 hours: Temp:  [96.1 F (35.6 C)-99 F (37.2 C)] 98.2 F (36.8 C) (11/30 0700) Pulse Rate:  [83-95] 90 (11/30 0700) Resp:  [15-21] 21 (11/30 0700) BP: (92-130)/(38-72) 102/48 (11/30 0700) SpO2:  [100 %] 100 % (11/30 0600) Weight:  [63.2 kg-63.8 kg] 63.2 kg (11/30 0500) Last BM Date: 02/09/19  Intake/Output from previous day: 11/29 0701 - 11/30 0700 In: 2512.9 [I.V.:1399.3; IV  Piggyback:1113.6] Out: 2790 [Urine:1825; Emesis/NG output:900; Drains:65] Intake/Output this shift: Total I/O In: 113.8 [I.V.:97.6; IV Piggyback:16.2] Out: -   PE:  Gen:  Alert, NAD, pleasant, cooperative HEENT: NGT in place with bilious output Pulm:  Rate and effort normal Abd: Soft, ND, hypoactive BS, midline incision is clean and without purulent drainage, subq tissue is pale, drain with minimal serous drainage, mild generalized TTP without guarding, no peritonitis  Skin: warm and dry Neuro: A&O x 3   Anti-infectives: Anti-infectives (From admission, onward)   Start     Dose/Rate Route Frequency Ordered Stop   02/05/19 1000  anidulafungin (ERAXIS) 100 mg in sodium chloride 0.9 % 100 mL IVPB     100 mg 78 mL/hr over 100 Minutes Intravenous Every 24 hours 02/04/19 0840     02/04/19 1000  anidulafungin (ERAXIS) 200 mg in sodium chloride 0.9 % 200 mL IVPB     200 mg 78 mL/hr over 200 Minutes Intravenous  Once 02/04/19 0840 02/04/19 1301   02/04/19 0200  fluconazole (DIFLUCAN) IVPB 200 mg  Status:  Discontinued     200 mg 100 mL/hr over 60 Minutes Intravenous Every 24 hours 02/02/19 2302 02/04/19 0840   02/03/19 1800  vancomycin (VANCOCIN) IVPB 750 mg/150 ml premix  Status:  Discontinued     750 mg 150 mL/hr over 60 Minutes Intravenous Every 24 hours 02/03/19 0241 02/03/19 0242   02/03/19 1800  vancomycin (VANCOCIN) IVPB 750 mg/150 ml premix  Status:  Discontinued     750 mg 150 mL/hr over 60 Minutes Intravenous Every 24 hours 02/03/19 0242 02/03/19 0858   02/03/19 0600  piperacillin-tazobactam (ZOSYN) IVPB 3.375 g     3.375 g 12.5 mL/hr over 240 Minutes Intravenous Every 8 hours 02/03/19 0243     02/02/19 2315  fluconazole (DIFLUCAN) IVPB 400 mg     400 mg 100 mL/hr over 120 Minutes Intravenous STAT 02/02/19 2301 02/03/19 0218   02/02/19 1800  ceFEPIme (MAXIPIME) 2 g in sodium chloride 0.9 % 100 mL IVPB     2 g 200 mL/hr over 30 Minutes Intravenous  Once 02/02/19 1745  02/02/19 2030   02/02/19 1800  metroNIDAZOLE (FLAGYL) IVPB 500 mg     500 mg 100 mL/hr over 60 Minutes Intravenous  Once 02/02/19 1745 02/02/19 2237   02/02/19 1800  vancomycin (VANCOCIN) IVPB 1000 mg/200 mL premix  Status:  Discontinued     1,000 mg 200 mL/hr over 60 Minutes Intravenous  Once 02/02/19 1745 02/02/19 1747   02/02/19 1800  vancomycin (VANCOCIN) 1,250 mg in sodium chloride 0.9 % 250 mL IVPB     1,250 mg 166.7 mL/hr over 90 Minutes Intravenous  Once 02/02/19 1747 02/02/19 2237      Lab Results:  Recent Labs    02/09/19 0528 02/10/19 0351  WBC 6.2 5.9  HGB 7.6* 7.3*  HCT 23.1* 22.8*  PLT 78* 110*   BMET Recent Labs    02/09/19 0528 02/09/19 1627 02/10/19 0351  NA 133*  --  136  K 3.2* 3.7 3.1*  CL 96*  --  99  CO2 26  --  25  GLUCOSE 251*  --  143*  BUN 41*  --  36*  CREATININE 2.01*  --  1.77*  CALCIUM 7.2*  --  7.6*   PT/INR No results for input(s): LABPROT, INR in the last 72 hours. CMP     Component Value Date/Time   NA 136 02/10/2019 0351   NA 139 10/04/2017 1705   K 3.1 (L) 02/10/2019 0351   CL 99 02/10/2019 0351   CO2 25 02/10/2019 0351   GLUCOSE 143 (H) 02/10/2019 0351   BUN 36 (H) 02/10/2019 0351   BUN 11 10/04/2017 1705   CREATININE 1.77 (H) 02/10/2019 0351   CREATININE 0.68 04/28/2016 1640   CALCIUM 7.6 (L) 02/10/2019 0351   PROT 4.1 (L) 02/10/2019 0351   PROT 7.1 10/04/2017 1705   ALBUMIN 1.2 (L) 02/10/2019 0351   ALBUMIN 3.7 10/04/2017 1705   AST 15 02/10/2019 0351   ALT 19 02/10/2019 0351   ALKPHOS 57 02/10/2019 0351   BILITOT 0.6 02/10/2019 0351   BILITOT <0.2 10/04/2017 1705   GFRNONAA 31 (L) 02/10/2019 0351   GFRNONAA >89 04/28/2016 1640   GFRAA 36 (L) 02/10/2019 0351   GFRAA >89 04/28/2016 1640   Lipase     Component Value Date/Time   LIPASE 27 04/01/2016 1040    Studies/Results: Dg Abd 1 View  Result Date: 02/09/2019 CLINICAL DATA:  NG tube placement EXAM: ABDOMEN - 1 VIEW COMPARISON:  None. FINDINGS: Minimal  bibasilar atelectasis. Enteric tube tip and side-port project over the stomach. Gaseous distended loops of small bowel within the central abdomen. IMPRESSION: Enteric tube tip and side-port project over the stomach. Electronically Signed   By: Lovey Newcomer M.D.   On: 02/09/2019 14:50    Ischemic changes to both BKA's - 02/10/2019  Kalman Drape, PA-C  [facility] Please see amion for pager for the following: M, T, W, & Friday 7:00am - 4:30pm Thursdays 7:00am -11:30am  Agree with above.  For UGI/CT scan to study GI tract. She also has developed ischemic changes of both LE/amputations.  Dr. Sharol Given did her amputations and it would be reasonable to contact him.  Alphonsa Overall, MD, [facility] Surgery Office phone:  [phone]

## 2019-02-10 NOTE — Evaluation (Signed)
Physical Therapy Evaluation Patient Details Name: Gina [name] MRN: [id number] DOB: [birth year] Today's Date: 02/10/2019   History of Present Illness  58 yo female admitted with septic shock, perforated ulcer. S/P ex lap, oversewing of duodenal ulcer 11/23. Extubated 11/29. Hx of bil BKA (2019/2020), DM, schizophrenia  Clinical Impression  On eval, pt required Max assist +2 for mobility. At times, pt's speech was difficult to understand. She was agreeable to working with PT and sitting up in the recliner. At baseline, pt is able to perform transfers with Mod Ind. She is currently requiring increased assistance 2* abd surgery, multiple lines/leads, and her UE ROM appears limited as well. Will continue to follow and progress activity as tolerated. Recommend SNF at this time. Pt has a history of refusing SNF placement so we will continue to assess pt and update d/c plan as necessary.     Follow Up Recommendations SNF (If pt refuses placement, then HHPT and 24 hour supervision/assist)    Equipment Recommendations  None recommended by PT    Recommendations for Other Services       Precautions / Restrictions Precautions Precautions: Fall Precaution Comments: bil BKA, NG tube, multiple lines/leads, NPO, R jp drain Restrictions Weight Bearing Restrictions: No      Mobility  Bed Mobility Overal bed mobility: Needs Assistance Bed Mobility: Supine to Sit     Supine to sit: HOB elevated;Mod assist;+2 for physical assistance;+2 for safety/equipment     General bed mobility comments: Assist for trunk to upright position and to scoot to EOB utilizing bedpads. Increased time. Once at EOB, pt able to sit with Min guard assist.  Transfers Overall transfer level: Needs assistance   Transfers: Lateral/Scoot Transfers          Lateral/Scoot Transfers: Max assist;+2 physical assistance;+2 safety/equipment General transfer comment: Lateral scoot, using bedpads, bed to recliner. Pt attempted  to assist with UEs but UE ROM appeared limited. Will plan to attempt A-P transfers once pt is able to participate more/less inhibited by lines  Ambulation/Gait                Stairs            Wheelchair Mobility    Modified Rankin (Stroke Patients Only)       Balance Overall balance assessment: Needs assistance Sitting-balance support: Bilateral upper extremity supported Sitting balance-Leahy Scale: Fair                                       Pertinent Vitals/Pain Pain Assessment: Faces Faces Pain Scale: Hurts little more Pain Location: Pt unable to state location of discomfort/pain Pain Descriptors / Indicators: Grimacing Pain Intervention(s): Limited activity within patient's tolerance;Repositioned    Home Living Family/patient expects to be discharged to:: Unsure Living Arrangements: Alone   Type of Home: Apartment Home Access: Level entry     Home Layout: One level Home Equipment: Walker - 2 wheels;Bedside commode;Wheelchair - manual;Shower seat(drop arm BSC) Additional Comments: pt has history of refusing SNF placement (per chart)    Prior Function Level of Independence: Needs assistance   Gait / Transfers Assistance Needed: able to transfer independently to [facility] with A/P technique           Hand Dominance        Extremity/Trunk Assessment   Upper Extremity Assessment Upper Extremity Assessment: Defer to OT evaluation    Lower Extremity Assessment Lower Extremity Assessment: (  bil BKA)    Cervical / Trunk Assessment Cervical / Trunk Assessment: Normal  Communication   Communication: No difficulties  Cognition Arousal/Alertness: Awake/alert Behavior During Therapy: WFL for tasks assessed/performed Overall Cognitive Status: No family/caregiver present to determine baseline cognitive functioning Area of Impairment: Problem solving                             Problem Solving: Slow processing;Requires verbal  cues;Requires tactile cues General Comments: unable to understand pt's speech at times      General Comments      Exercises     Assessment/Plan    PT Assessment Patient needs continued PT services  PT Problem List Decreased strength;Decreased mobility;Decreased balance;Decreased activity tolerance;Decreased range of motion;Pain;Decreased skin integrity       PT Treatment Interventions DME instruction;Gait training;Therapeutic activities;Patient/family education;Therapeutic exercise;Balance training;Functional mobility training    PT Goals (Current goals can be found in the Care Plan section)  Acute Rehab PT Goals Patient Stated Goal: none stated PT Goal Formulation: With patient Time For Goal Achievement: 02/24/19 Potential to Achieve Goals: Fair    Frequency Min 3X/week   Barriers to discharge Decreased caregiver support      Co-evaluation               AM-PAC PT "6 Clicks" Mobility  Outcome Measure Help needed turning from your back to your side while in a flat bed without using bedrails?: A Lot Help needed moving from lying on your back to sitting on the side of a flat bed without using bedrails?: A Lot Help needed moving to and from a bed to a chair (including a wheelchair)?: Total Help needed standing up from a chair using your arms (e.g., wheelchair or bedside chair)?: Total Help needed to walk in hospital room?: Total Help needed climbing 3-5 steps with a railing? : Total 6 Click Score: 8    End of Session   Activity Tolerance: Patient tolerated treatment well Patient left: in chair;with call bell/phone within reach;with chair alarm set   PT Visit Diagnosis: Muscle weakness (generalized) (M62.81);Other abnormalities of gait and mobility (R26.89)    Time: [id number] PT Time Calculation (min) (ACUTE ONLY): 18 min   Charges:   PT Evaluation $PT Eval Moderate Complexity: [place], PT Acute Rehabilitation Services Pager:  [phone] Office: [phone]

## 2019-02-10 NOTE — Progress Notes (Signed)
PROGRESS NOTE    Gina [name]   WCB:[id number]  DOB: [birth year]  DOA: 02/02/2019 PCP: [name], MD   Brief Narrative:  Gina [name] is a 58 y.o. 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 In the ED, she was hypothermic and severely hypotensive. Initial blood glucose >1000.   CT abdomen showed pneumoperitoneum and mesenteric ischemia. Admitted to ICU service and started on IV antibiotics.   11/23 > She underwent exploratory Laparotomy with finding of perforated prepyloric ulcer which was oversewn and 3.1 L of contaminated peritoneal fluid.   Extubated on 11/29 Hospital course complicated by DKA, AKI, acute blood loss anemia and acute thrombocytopenia.  Transferred out of ICU to [facility] today. General surgery following   Subjective: She has no complaints.      Assessment & Plan:   Principal Problem:   Septic shock due to ruptured gastric ulcer with acute peritonitis, bacteremia and UTI - ID consulted on 02/04/19 - Blood cultures from 11/22> Candida Glabrata- repeat blood culture 11/24 NGTD - Urine culture 11/22 > K pneumoniae - Peritoneal fluid 11/23> Candida albicans - Opthalmology consulted - no evidence of fungal endophthalmitis  - currently receiving Andulafungin and Zosyn - ID hopes for a line holiday when able to remove central line  Active Problems:   DKA (diabetic ketoacidoses), severely uncontrolled with hyperglycemia  - resolved - A1c on 11/23>  15.2 - Uses Lantus and Novolog as outpt- currently on sliding scale insulin and NPO  Hypokalemia - replacing    Acute kidney injury  - baseline Cr is < 1.0 - Cr rose to 2.66 on 11/26 and is steadily improving - foley present- remove this today and use purewick  Anemia due to acute blood loss - s/p  1 U PRBC on 11/28 for Hb of 6.1 - remains slightly anemia with Hb ~ 7 - check anemia panel    Acute thrombocytopenia - due to sepsis- Platelets now rising- nadir was > 31 on 11/27  Severe protein calorie malnutrition with hypoalbuminemia - NPO at this time- NG tube is still draining-   Blebs on distal extremities - noted on stumps and hands today- I am not sure how long these have been present- one of these has ruptured- will ask for a wound care eval-   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 - not on medications for this  Left phalanx amputation  - on 12/28/18-   B/ l BKA   Time spent in minutes: 40 min in reviewing chart and speaking with multiple consultants DVT prophylaxis: heparin Code Status: Full code Family Communication:  Disposition Plan: cont to follow in SDU- PT recommending SNF Consultants:   General surgery  ID Procedures:   Intubation/ Extubation  Ex lap Antimicrobials:  Anti-infectives (From admission, onward)   Start     Dose/Rate Route Frequency Ordered Stop   02/05/19 1000  anidulafungin (ERAXIS) 100 mg in sodium chloride 0.9 % 100 mL IVPB     100 mg 78 mL/hr over 100 Minutes Intravenous Every 24 hours 02/04/19 0840     02/04/19 1000  anidulafungin (ERAXIS) 200 mg in sodium chloride 0.9 % 200 mL IVPB     200 mg 78 mL/hr over 200 Minutes Intravenous  Once 02/04/19 0840 02/04/19 1301   02/04/19  0200  fluconazole (DIFLUCAN) IVPB 200 mg  Status:  Discontinued     200 mg 100 mL/hr over 60 Minutes Intravenous Every 24 hours 02/02/19 2302 02/04/19 0840   02/03/19 1800  vancomycin (VANCOCIN) IVPB 750 mg/150 ml premix  Status:  Discontinued     750 mg 150 mL/hr over 60 Minutes Intravenous Every 24 hours 02/03/19 0241 02/03/19 0242   02/03/19 1800  vancomycin (VANCOCIN) IVPB 750 mg/150 ml premix  Status:  Discontinued     750 mg 150 mL/hr over 60 Minutes Intravenous Every 24 hours 02/03/19 0242 02/03/19 0858   02/03/19 0600   piperacillin-tazobactam (ZOSYN) IVPB 3.375 g     3.375 g 12.5 mL/hr over 240 Minutes Intravenous Every 8 hours 02/03/19 0243     02/02/19 2315  fluconazole (DIFLUCAN) IVPB 400 mg     400 mg 100 mL/hr over 120 Minutes Intravenous STAT 02/02/19 2301 02/03/19 0218   02/02/19 1800  ceFEPIme (MAXIPIME) 2 g in sodium chloride 0.9 % 100 mL IVPB     2 g 200 mL/hr over 30 Minutes Intravenous  Once 02/02/19 1745 02/02/19 2030   02/02/19 1800  metroNIDAZOLE (FLAGYL) IVPB 500 mg     500 mg 100 mL/hr over 60 Minutes Intravenous  Once 02/02/19 1745 02/02/19 2237   02/02/19 1800  vancomycin (VANCOCIN) IVPB 1000 mg/200 mL premix  Status:  Discontinued     1,000 mg 200 mL/hr over 60 Minutes Intravenous  Once 02/02/19 1745 02/02/19 1747   02/02/19 1800  vancomycin (VANCOCIN) 1,250 mg in sodium chloride 0.9 % 250 mL IVPB     1,250 mg 166.7 mL/hr over 90 Minutes Intravenous  Once 02/02/19 1747 02/02/19 2237       Objective: Vitals:   02/10/19 0100 02/10/19 0500 02/10/19 0600 02/10/19 0700  BP: (!) 93/50  (!) 105/47 (!) 102/48  Pulse: 83  87 90  Resp: 20  (!) 21 (!) 21  Temp: (!) 97.2 F (36.2 C)  98.1 F (36.7 C) 98.2 F (36.8 C)  TempSrc:      SpO2:   100%   Weight:  63.2 kg    Height:        Intake/Output Summary (Last 24 hours) at 02/10/2019 0746 Last data filed at 02/10/2019 6384 Gross per 24 hour  Intake 2440.25 ml  Output 2465 ml  Net -24.75 ml   Filed Weights   02/09/19 0500 02/09/19 2015 02/10/19 0500  Weight: 65.6 kg 63.8 kg 63.2 kg    Examination: General exam: Appears comfortable  HEENT: PERRLA, oral mucosa moist, no sclera icterus or thrush Respiratory system: Clear to auscultation. Respiratory effort normal. Cardiovascular system: S1 & S2 heard, RRR.   Gastrointestinal system: Abdomen soft, nondistended- decreased bowel sounds- NG tube and abdominal drain present- Dressing not opened Central nervous system: Alert and oriented. No focal neurological deficits.  Extremities: No cyanosis, clubbing or edema Skin: large blebs (not fluid filled) on extremities  Psychiatry:  Mood & affect appropriate.     Data Reviewed: I have personally reviewed following labs and imaging studies  CBC: Recent Labs  Lab 02/06/19 0147 02/07/19 0348 02/08/19 0409 02/08/19 1407 02/09/19 0528 02/10/19 0351  WBC 3.0* 3.3* 4.5  --  6.2 5.9  NEUTROABS  --   --   --   --   --  4.3  HGB 7.0* 7.0* 6.1* 7.5* 7.6* 7.3*  HCT 20.7* 20.2* 18.7* 23.0* 23.1* 22.8*  MCV 85.9 87.1 89.5  --  88.2 89.8  PLT 33* 31*  58*  --  78* 834*   Basic Metabolic Panel: Recent Labs  Lab 02/04/19 0120  02/05/19 0230  02/06/19 0147  02/06/19 1535 02/07/19 0348 02/07/19 1609 02/08/19 0409 02/09/19 0528 02/09/19 1627 02/10/19 0351  NA 131*   < >  --    < > 128*   < > 130* 128* 129* 129* 133*  --  136  K 5.7*   < >  --    < > 3.0*   < > 3.0* 3.0* 3.3* 3.1* 3.2* 3.7 3.1*  CL 105   < >  --    < > 86*   < > 86* 86* 89* 89* 96*  --  99  CO2 14*   < >  --    < > 25   < > 28 27 25 26 26   --  25  GLUCOSE 176*   < >  --    < > 105*   < > 64* 72 169* 234* 251*  --  143*  BUN 42*   < >  --    < > 53*   < > 52* 50* 51* 46* 41*  --  36*  CREATININE 2.22*   < >  --    < > 2.66*   < > 2.55* 2.52* 2.49* 2.38* 2.01*  --  1.77*  CALCIUM 7.1*   < >  --    < > 6.4*   < > 6.9* 7.0* 6.9* 7.3* 7.2*  --  7.6*  MG 1.7  --  1.4*  --  2.0  --  1.9 1.9  --  2.3 2.1  --  2.1  PHOS 5.2*  --  5.6*  --  5.0*  --   --   --   --   --   --   --  3.2   < > = values in this interval not displayed.   GFR: Estimated Creatinine Clearance: 30.6 mL/min (A) (by C-G formula based on SCr of 1.77 mg/dL (H)). Liver Function Tests: Recent Labs  Lab 02/10/19 0351  AST 15  ALT 19  ALKPHOS 57  BILITOT 0.6  PROT 4.1*  ALBUMIN 1.2*   No results for input(s): LIPASE, AMYLASE in the last 168 hours. No results for input(s): AMMONIA in the last 168 hours. Coagulation Profile: No results for input(s): INR, PROTIME in the  last 168 hours. Cardiac Enzymes: No results for input(s): CKTOTAL, CKMB, CKMBINDEX, TROPONINI in the last 168 hours. BNP (last 3 results) No results for input(s): PROBNP in the last 8760 hours. HbA1C: No results for input(s): HGBA1C in the last 72 hours. CBG: Recent Labs  Lab 02/09/19 1139 02/09/19 1616 02/09/19 2102 02/09/19 2329 02/10/19 0347  GLUCAP 160* 130* 174* 164* 160*   Lipid Profile: Recent Labs    02/10/19 0351  TRIG 134   Thyroid Function Tests: No results for input(s): TSH, T4TOTAL, FREET4, T3FREE, THYROIDAB in the last 72 hours. Anemia Panel: No results for input(s): VITAMINB12, FOLATE, FERRITIN, TIBC, IRON, RETICCTPCT in the last 72 hours. Urine analysis:    Component Value Date/Time   COLORURINE AMBER (A) 02/02/2019 1744   APPEARANCEUR CLOUDY (A) 02/02/2019 1744   LABSPEC 1.023 02/02/2019 1744   PHURINE 5.0 02/02/2019 1744   GLUCOSEU >=500 (A) 02/02/2019 1744   HGBUR MODERATE (A) 02/02/2019 [place] 02/02/2019 1744   BILIRUBINUR N 04/28/2016 1603   KETONESUR 5 (A) 02/02/2019 1744   PROTEINUR 100 (A) 02/02/2019 1744  UROBILINOGEN 0.2 04/28/2016 1603   UROBILINOGEN 0.2 06/18/2013 0650   NITRITE NEGATIVE 02/02/2019 1744   LEUKOCYTESUR LARGE (A) 02/02/2019 1744   Sepsis Labs: @LABRCNTIP (procalcitonin:4,lacticidven:4) ) Recent Results (from the past 240 hour(s))  Antifungal AST 9 Drug Panel     Status: None   Collection Time: 02/02/19 12:00 AM  Result Value Ref Range Status   Organism ID, Yeast Candida glabrata  Corrected    Comment: (NOTE) Identification performed by account, not confirmed by this laboratory. CORRECTED ON 11/29 AT 1435: PREVIOUSLY REPORTED AS Preliminary report Specimen has been received and testing has been initiated.    Amphotericin B MIC 1.0 ug/mL  Final   Please Note: Comment  Final    Comment: (NOTE) CLSI does not have established guidelines for interpretation of these organism-drug combinations. For  research use only.    Anidulafungin MIC Comment  Final    Comment: 0.12 ug/mL Susceptible   Caspofungin MIC Comment  Final    Comment: 0.12 ug/mL Susceptible   Micafungin MIC Comment  Final    Comment: 0.03 ug/mL Susceptible   Posaconazole MIC 2.0 ug/mL  Final   Please Note: Comment  Final    Comment: (NOTE) Results for this test are for research purposes only by the assay's manufacturer.  The performance characteristics of this product have not been established.  Results should not be used as a diagnostic procedure without confirmation of the diagnosis by another medically established diagnostic product or procedure.    Fluconazole Islt MIC 16.0 ug/mL  Final    Comment: Susceptible Dose Dependent   Flucytosine MIC 0.06 ug/mL or less  Final   Itraconazole MIC 1.0 ug/mL  Final   Voriconazole MIC 0.5 ug/mL  Final    Comment: (NOTE) Performed At: [facility] [place], [place] [id number] Rush Farmer MD JS:[id number]    Source BLOOD/ CANDIDA GLABRATA  Final    Comment: Performed at [facility], [place] [address], [place], [place] [address]  Urine culture     Status: Abnormal   Collection Time: 02/02/19  5:44 PM   Specimen: In/Out Cath Urine  Result Value Ref Range Status   Specimen Description   Final    IN/OUT CATH URINE Performed at [facility] [address], [place], [place] [address]    Special Requests   Final    NONE Performed at [facility], [place] [address], [place],  [address]    Culture >=100,000 COLONIES/mL KLEBSIELLA PNEUMONIAE (A)  Final   Report Status 02/04/2019 FINAL  Final   Organism ID, Bacteria KLEBSIELLA PNEUMONIAE (A)  Final      Susceptibility   Klebsiella pneumoniae - MIC*    AMPICILLIN >=32 RESISTANT Resistant     CEFAZOLIN <=4 SENSITIVE Sensitive     CEFTRIAXONE <=1 SENSITIVE Sensitive     CIPROFLOXACIN <=0.25 SENSITIVE Sensitive     GENTAMICIN <=1 SENSITIVE Sensitive      IMIPENEM <=0.25 SENSITIVE Sensitive     NITROFURANTOIN 64 INTERMEDIATE Intermediate     TRIMETH/SULFA <=20 SENSITIVE Sensitive     AMPICILLIN/SULBACTAM 4 SENSITIVE Sensitive     PIP/TAZO <=4 SENSITIVE Sensitive     Extended ESBL NEGATIVE Sensitive     * >=100,000 COLONIES/mL KLEBSIELLA PNEUMONIAE  Blood Culture (routine x 2)     Status: Abnormal (Preliminary result)   Collection Time: 02/02/19  6:16 PM   Specimen: BLOOD  Result Value Ref Range Status   Specimen Description   Final  BLOOD LEFT ANTECUBITAL Performed at [facility] [address] [place], [place] [address]    Special Requests   Final    BOTTLES DRAWN AEROBIC AND ANAEROBIC Blood Culture adequate volume Performed at [facility] [address] [place], [place] [address]    Culture  Setup Time (A)  Final    YEAST ANAEROBIC BOTTLE ONLY CRITICAL RESULT CALLED TO, READ BACK BY AND VERIFIED WITH: Christean Grief PHARMD, AT 7846 02/04/19 BY D. VANHOOK    Culture (A)  Final    CANDIDA GLABRATA Sent to [facility] for further susceptibility testing. Performed at [facility], [place] [address], [place], [place] [address]    Report Status PENDING  Incomplete  Blood Culture ID Panel (Reflexed)     Status: Abnormal   Collection Time: 02/02/19  6:16 PM  Result Value Ref Range Status   Enterococcus species NOT DETECTED NOT DETECTED Final   Listeria monocytogenes NOT DETECTED NOT DETECTED Final   Staphylococcus species NOT DETECTED NOT DETECTED Final   Staphylococcus aureus (BCID) NOT DETECTED NOT DETECTED Final   Streptococcus species NOT DETECTED NOT DETECTED Final   Streptococcus agalactiae NOT DETECTED NOT DETECTED Final   Streptococcus pneumoniae NOT DETECTED NOT DETECTED Final   Streptococcus pyogenes NOT DETECTED NOT DETECTED Final   Acinetobacter baumannii NOT DETECTED NOT DETECTED Final   Enterobacteriaceae species NOT DETECTED NOT DETECTED Final   Enterobacter cloacae complex  NOT DETECTED NOT DETECTED Final   Escherichia coli NOT DETECTED NOT DETECTED Final   Klebsiella oxytoca NOT DETECTED NOT DETECTED Final   Klebsiella pneumoniae NOT DETECTED NOT DETECTED Final   Proteus species NOT DETECTED NOT DETECTED Final   Serratia marcescens NOT DETECTED NOT DETECTED Final   Haemophilus influenzae NOT DETECTED NOT DETECTED Final   Neisseria meningitidis NOT DETECTED NOT DETECTED Final   Pseudomonas aeruginosa NOT DETECTED NOT DETECTED Final   Candida albicans NOT DETECTED NOT DETECTED Final   Candida glabrata DETECTED (A) NOT DETECTED Final    Comment: CRITICAL RESULT CALLED TO, READ BACK BY AND VERIFIED WITH: J. LEGGE PHARMD, AT 2841 02/04/19 BY D. VANHOOK    Candida krusei NOT DETECTED NOT DETECTED Final   Candida parapsilosis NOT DETECTED NOT DETECTED Final   Candida tropicalis NOT DETECTED NOT DETECTED Final    Comment: Performed at [facility], [address] [place], [place] [address]  SARS Coronavirus 2 by RT PCR (hospital order, performed in [facility] hospital lab) Nasopharyngeal Nasopharyngeal Swab     Status: None   Collection Time: 02/02/19  9:13 PM   Specimen: Nasopharyngeal Swab  Result Value Ref Range Status   SARS Coronavirus 2 NEGATIVE NEGATIVE Final    Comment: (NOTE) If result is NEGATIVE SARS-CoV-2 target nucleic acids are NOT DETECTED. The SARS-CoV-2 RNA is generally detectable in upper and lower  respiratory specimens during the acute phase of infection. The lowest  concentration of SARS-CoV-2 viral copies this assay can detect is 250  copies / mL. A negative result does not preclude SARS-CoV-2 infection  and should not be used as the sole basis for treatment or other  patient management decisions.  A negative result may occur with  improper specimen collection / handling, submission of specimen other  than nasopharyngeal swab, presence of viral mutation(s) within the  areas targeted by this assay, and inadequate number of viral  copies  (<250 copies / mL). A negative result must be combined with clinical  observations, patient history, and epidemiological  information. If result is POSITIVE SARS-CoV-2 target nucleic acids are DETECTED. The SARS-CoV-2 RNA is generally detectable in upper and lower  respiratory specimens dur ing the acute phase of infection.  Positive  results are indicative of active infection with SARS-CoV-2.  Clinical  correlation with patient history and other diagnostic information is  necessary to determine patient infection status.  Positive results do  not rule out bacterial infection or co-infection with other viruses. If result is PRESUMPTIVE POSTIVE SARS-CoV-2 nucleic acids MAY BE PRESENT.   A presumptive positive result was obtained on the submitted specimen  and confirmed on repeat testing.  While 2019 novel coronavirus  (SARS-CoV-2) nucleic acids may be present in the submitted sample  additional confirmatory testing may be necessary for epidemiological  and / or clinical management purposes  to differentiate between  SARS-CoV-2 and other Sarbecovirus currently known to infect humans.  If clinically indicated additional testing with an alternate test  methodology [phone]) is advised. The SARS-CoV-2 RNA is generally  detectable in upper and lower respiratory sp ecimens during the acute  phase of infection. The expected result is Negative. Fact Sheet for Patients:  StrictlyIdeas.no Fact Sheet for Healthcare Providers: BankingDealers.co.za This test is not yet approved or cleared by the [place] FDA and has been authorized for detection and/or diagnosis of SARS-CoV-2 by FDA under an Emergency Use Authorization (EUA).  This EUA will remain in effect (meaning this test can be used) for the duration of the COVID-19 declaration under Section 564(b)(1) of the Act, 21 U.S.C. section 360bbb-3(b)(1), unless the authorization is terminated  or revoked sooner. Performed at [facility], [place] [address], [place], [place] [address]   Aerobic/Anaerobic Culture (surgical/deep wound)     Status: None   Collection Time: 02/03/19  2:25 AM   Specimen: Peritoneal; Body Fluid  Result Value Ref Range Status   Specimen Description   Final    PERITONEAL Performed at [facility] [address], [place], [place] [address]    Special Requests   Final    NONE Performed at [facility], [place] [address] [place], [place] [address]    Gram Stain NO WBC SEEN RARE BUDDING YEAST SEEN   Final   Culture   Final    ABUNDANT CANDIDA ALBICANS MODERATE CANDIDA GLABRATA NO ANAEROBES ISOLATED Performed at [facility], [address] [place], [place] [address]    Report Status 02/08/2019 FINAL  Final  MRSA PCR Screening     Status: None   Collection Time: 02/03/19  2:53 AM   Specimen: Nasopharyngeal  Result Value Ref Range Status   MRSA by PCR NEGATIVE NEGATIVE Final    Comment:        The GeneXpert MRSA Assay (FDA approved for NASAL specimens only), is one component of a comprehensive MRSA colonization surveillance program. It is not intended to diagnose MRSA infection nor to guide or monitor treatment for MRSA infections. Performed at [facility], [place] [address] [place], [place] [address]   Culture, blood (routine x 2)     Status: None   Collection Time: 02/04/19 11:02 AM   Specimen: BLOOD RIGHT HAND  Result Value Ref Range Status   Specimen Description   Final    BLOOD RIGHT HAND Performed at [facility], [place] [address] [place], [place] [address]    Special Requests   Final    BOTTLES DRAWN AEROBIC ONLY Blood Culture results may not be optimal  due to an inadequate volume of blood received in culture bottles Performed at [facility] [address], [place], [place] [address]    Culture   Final    NO GROWTH 5 DAYS  Performed at [facility], [place] [address], [place], [place] [address]    Report Status 02/09/2019 FINAL  Final  Culture, blood (routine x 2)     Status: None   Collection Time: 02/04/19 11:25 AM   Specimen: BLOOD  Result Value Ref Range Status   Specimen Description   Final    BLOOD RIGHT ARM Performed at [facility] [address], [place], [place] [address]    Special Requests   Final    BOTTLES DRAWN AEROBIC AND ANAEROBIC Blood Culture results may not be optimal due to an inadequate volume of blood received in culture bottles Performed at [facility] [address], [place], [place] [address]    Culture   Final    NO GROWTH 5 DAYS Performed at [facility], [place] [address], [place], [place] [address]    Report Status 02/09/2019 FINAL  Final  Culture, blood (routine x 2)     Status: None (Preliminary result)   Collection Time: 02/08/19 12:13 PM   Specimen: BLOOD  Result Value Ref Range Status   Specimen Description   Final    BLOOD RIGHT WRIST Performed at [facility] [address], [place], [place] [address]    Special Requests   Final    BOTTLES DRAWN AEROBIC AND ANAEROBIC Blood Culture adequate volume Performed at [facility] [address], [place], [place] [address]    Culture   Final    NO GROWTH 2 DAYS Performed at [facility] [address], [place], [place] [address]    Report Status PENDING  Incomplete  Culture, blood (routine x 2)     Status: None (Preliminary result)   Collection Time: 02/08/19 12:28 PM   Specimen: BLOOD  Result Value Ref Range Status   Specimen Description   Final    BLOOD RIGHT ARM Performed at [facility] [address] [place], [place] [address]    Special Requests   Final    BOTTLES DRAWN AEROBIC ONLY Blood Culture results may not be optimal due to an inadequate volume of blood received in culture bottles  Performed at [facility] [address], [place], [place] [address]    Culture   Final    NO GROWTH 2 DAYS Performed at [facility] [address] [place], [place] [address]    Report Status PENDING  Incomplete         Radiology Studies: Dg Abd 1 View  Result Date: 02/09/2019 CLINICAL DATA:  NG tube placement EXAM: ABDOMEN - 1 VIEW COMPARISON:  None. FINDINGS: Minimal bibasilar atelectasis. Enteric tube tip and side-port project over the stomach. Gaseous distended loops of small bowel within the central abdomen. IMPRESSION: Enteric tube tip and side-port project over the stomach. Electronically Signed   By: Lovey Newcomer M.D.   On: 02/09/2019 14:50      Scheduled Meds: . sodium chloride   Intravenous Once  . chlorhexidine gluconate (MEDLINE KIT)  15 mL Mouth Rinse BID  . Chlorhexidine Gluconate Cloth  6 each Topical Daily  . Gerhardt's butt cream   Topical TID  . heparin injection (subcutaneous)  5,000 Units Subcutaneous Q8H  . insulin  aspart  0-9 Units Subcutaneous Q4H  . miconazole nitrate   Topical TID  . pantoprazole (PROTONIX) IV  40 mg Intravenous Q12H  . sodium chloride flush  10-40 mL Intracatheter Q12H   Continuous Infusions: . sodium chloride    . anidulafungin Stopped (02/09/19 1118)  . dextrose 75 mL/hr at 02/10/19 0718  . norepinephrine (LEVOPHED) Adult infusion Stopped (02/09/19 0911)  . piperacillin-tazobactam (ZOSYN)  IV 12.5 mL/hr at 02/10/19 0718  . potassium chloride       LOS: 8 days      [name], MD [facility] Pager: [email or web address] Password [facility] 02/10/2019, 7:46 AM
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
## 2019-02-10 NOTE — Progress Notes (Signed)
[place] NOTE   Pharmacy Consult for TPN Indication:  Poor po intake  HPI: 68 yoF found down at home (lives alone), admit 11/23. Intubated > extubated 11/29. NG tube in place - possible tube feeds, plan UGI to r/o leak  TPN Access: central line TPN start date: 11/30   Patient Measurements: Estimated body mass index is 25.48 kg/m as calculated from the following:   Height as of this encounter: 5\' 2"  (1.575 m).   Weight as of this encounter: 139 lb 5.3 oz (63.2 kg).  Intake/Output:  Estimated Nutritional Needs - Per RD recommendations on 11/30 KCal: 2025-2330  Protein: 115-130 Fluid: 2L/day or more   Current Nutrition: NPO  IVF: D10 W at 75 ml/hr- stop when TPN starts  Insulin Requirements: sens Novolog SSI q4hr   Recent Labs    02/09/19 0528 02/09/19 1627 02/10/19 0351  NA 133*  --  136  K 3.2* 3.7 3.1*  CL 96*  --  99  CO2 26  --  25  GLUCOSE 251*  --  143*  BUN 41*  --  36*  CREATININE 2.01*  --  1.77*  CALCIUM 7.2*  --  7.6*  PHOS  --   --  3.2  MG 2.1  --  2.1  ALBUMIN  --   --  1.2*  ALKPHOS  --   --  57  AST  --   --  15  ALT  --   --  19  BILITOT  --   --  0.6  TRIG  --   --  134  PREALBUMIN  --   --  <5*    ASSESSMENT                                                                                                           Glucose - chronically low blood sugars, required D10W  Electrolytes - wnl, exc K at 3.1  Renal - improved, SCr 1.77  LFTs -  wnl  TGs - 134 (11/30)  Prealbumin - <5 (11/30)  PLAN                                                                                                                KCl 10 mEq bolus x4         At 1800 today:  Start custom TPN at 40 ml/hr.  TPN to contain standard multivitamins and trace elements.  Change IVF to NS with 20 mEq KCl/L at 40 ml/hr.  Continue CBG q4h with sens-scale SSI coverage Plan  to advance TPN as tolerated to the goal rate. TPN lab  panels on Mondays & Thursdays. F/u daily.   Minda Ditto  PharmD 02/10/2019 10:13 AM

## 2019-02-10 NOTE — Progress Notes (Signed)
I called and spoke to a nurse at Dr. Lequita Asal office. She is going to send him a message to come a look at the patient's L hand.  Jackson Latino, [facility]

## 2019-02-10 NOTE — Progress Notes (Signed)
Nutrition Follow-up  DOCUMENTATION CODES:   Not applicable  INTERVENTION:  - TPN initiation and advancement per Pharmacy. - diet advancement as medically feasible.  Monitor magnesium, potassium, and phosphorus daily for at least 3 days, MD to replete as needed, as pt is at risk for refeeding syndrome given >1 week without nutrition, current hypokalemia.    NUTRITION DIAGNOSIS:   Inadequate oral intake related to altered GI function(pyloric channel ulcer perforation s/p ex lap) as evidenced by NPO status. -ongoing  GOAL:   Patient will meet greater than or equal to 90% of their needs -unmet at this time  MONITOR:   Diet advancement, Labs, Weight trends, Skin, Other (Comment)(TPN regimen)  REASON FOR ASSESSMENT:   Consult New TPN/TNA  ASSESSMENT:   58 year old female with past medical history of HTN, schizophrenia, poorly controlled T2DM with diabetic foot wounds s/p bilateral BKA, and recent left phalanx amputation, who presented to ED via EMS with AMS, nausea, vomiting and abdominal pain. Patient accompanied by daughter who reports patient has been complaining of "flu-like symptoms" for the past few days endorsing fever and chills. Patient lives alone, but has a caretaker that lives nearby and found patient on the floor of her home very altered. In ED patient found to be hypothermic and severely hypotensive, BG >1000, CT a/p showed findings concerning for mesenteric ischemia.  Significant Events: 11/22- admission 11/23- ex lap; over-sewing of duodenal ulcer with graham patch; pyloric channel ulcer perf; NGT placed 11/29- extubation; NGT replaced 11/30- plan for initiation of TPN   Weight today consistent with weight earlier in hospitalization and weight in May. Weight in September and October lower than current weight; will continue to monitor. Patient has been NPO since admission. NGT remains in place with ~300 ml dark green output present at this time. She denies any  abdominal pain and states she sometimes experiences nausea but has not been nauseous this morning. She did not sleep well.  Able to talk briefly with both RN and Pharmacist concerning patient and plan for TPN.    Labs reviewed; CBGs: 160 and 113 mg/dl, K: 3.1 mmol/l, BUN: 36 mg/dl, creatinine: 1.77 mg/dl, Ca: 7.6 mg/dl. Medications reviewed; sliding scale novolog, 40 mg IV protonix BID, 10 mEq IV KCl x4 runs 11/30. IVF; D10 @ 75 ml/hr (612 kcal).    NUTRITION - FOCUSED PHYSICAL EXAM:    Most Recent Value  Orbital Region  No depletion  Upper Arm Region  No depletion  Thoracic and Lumbar Region  No depletion  Buccal Region  No depletion  Temple Region  No depletion  Clavicle Bone Region  No depletion  Clavicle and Acromion Bone Region  No depletion  Scapular Bone Region  Unable to assess  Dorsal Hand  No depletion  Patellar Region  No depletion  Anterior Thigh Region  No depletion  Posterior Calf Region  Unable to assess [bilateral BKAs with amputations close to knee joints]  Edema (RD Assessment)  Mild  Hair  Reviewed  Eyes  Reviewed  Mouth  Reviewed  Skin  Reviewed  Nails  Reviewed       Diet Order:   Diet Order            Diet NPO time specified  Diet effective now              EDUCATION NEEDS:   No education needs have been identified at this time  Skin:  Skin Assessment: Skin Integrity Issues: Skin Integrity Issues:: Stage II, Unstageable, Incisions  Stage II: L buttocks Unstageable: full thickness coccyx Incisions: abdomen (11/23)  Last BM:  11/29  Height:   Ht Readings from Last 1 Encounters:  02/07/19 5\' 2"  (1.575 m)    Weight:   Wt Readings from Last 1 Encounters:  02/10/19 63.2 kg    Ideal Body Weight:  44.2 kg(Adjusted IBW for bilateral BKA)  BMI:  Body mass index is 25.48 kg/m.  Estimated Nutritional Needs:   Kcal:  2025-2330 kcal  Protein:  115-130 grams  Fluid:  >/= 2 L/day     Jarome Matin, MS, RD, LDN,  [facility] Inpatient Clinical Dietitian Pager # [phone] After hours/weekend pager # [phone]

## 2019-02-10 NOTE — Progress Notes (Signed)
Subjective: No new complaints Antibiotics:  Anti-infectives (From admission, onward)   Start     Dose/Rate Route Frequency Ordered Stop   02/05/19 1000  anidulafungin (ERAXIS) 100 mg in sodium chloride 0.9 % 100 mL IVPB     100 mg 78 mL/hr over 100 Minutes Intravenous Every 24 hours 02/04/19 0840     02/04/19 1000  anidulafungin (ERAXIS) 200 mg in sodium chloride 0.9 % 200 mL IVPB     200 mg 78 mL/hr over 200 Minutes Intravenous  Once 02/04/19 0840 02/04/19 1301   02/04/19 0200  fluconazole (DIFLUCAN) IVPB 200 mg  Status:  Discontinued     200 mg 100 mL/hr over 60 Minutes Intravenous Every 24 hours 02/02/19 2302 02/04/19 0840   02/03/19 1800  vancomycin (VANCOCIN) IVPB 750 mg/150 ml premix  Status:  Discontinued     750 mg 150 mL/hr over 60 Minutes Intravenous Every 24 hours 02/03/19 0241 02/03/19 0242   02/03/19 1800  vancomycin (VANCOCIN) IVPB 750 mg/150 ml premix  Status:  Discontinued     750 mg 150 mL/hr over 60 Minutes Intravenous Every 24 hours 02/03/19 0242 02/03/19 0858   02/03/19 0600  piperacillin-tazobactam (ZOSYN) IVPB 3.375 g     3.375 g 12.5 mL/hr over 240 Minutes Intravenous Every 8 hours 02/03/19 0243     02/02/19 2315  fluconazole (DIFLUCAN) IVPB 400 mg     400 mg 100 mL/hr over 120 Minutes Intravenous STAT 02/02/19 2301 02/03/19 0218   02/02/19 1800  ceFEPIme (MAXIPIME) 2 g in sodium chloride 0.9 % 100 mL IVPB     2 g 200 mL/hr over 30 Minutes Intravenous  Once 02/02/19 1745 02/02/19 2030   02/02/19 1800  metroNIDAZOLE (FLAGYL) IVPB 500 mg     500 mg 100 mL/hr over 60 Minutes Intravenous  Once 02/02/19 1745 02/02/19 2237   02/02/19 1800  vancomycin (VANCOCIN) IVPB 1000 mg/200 mL premix  Status:  Discontinued     1,000 mg 200 mL/hr over 60 Minutes Intravenous  Once 02/02/19 1745 02/02/19 1747   02/02/19 1800  vancomycin (VANCOCIN) 1,250 mg in sodium chloride 0.9 % 250 mL IVPB     1,250 mg 166.7 mL/hr over 90 Minutes Intravenous  Once 02/02/19 1747  02/02/19 2237      Medications: Scheduled Meds:  sodium chloride   Intravenous Once   chlorhexidine gluconate (MEDLINE KIT)  15 mL Mouth Rinse BID   Chlorhexidine Gluconate Cloth  6 each Topical Daily   Gerhardt's butt cream   Topical TID   heparin injection (subcutaneous)  5,000 Units Subcutaneous Q8H   insulin aspart  0-9 Units Subcutaneous Q4H   miconazole nitrate   Topical TID   pantoprazole (PROTONIX) IV  40 mg Intravenous Q12H   sodium chloride flush  10-40 mL Intracatheter Q12H   Continuous Infusions:  sodium chloride     0.9 % NaCl with KCl 20 mEq / L     anidulafungin Stopped (02/10/19 1201)   dextrose Stopped (02/10/19 1130)   norepinephrine (LEVOPHED) Adult infusion Stopped (02/09/19 0911)   piperacillin-tazobactam (ZOSYN)  IV 3.375 g (02/10/19 1309)   TPN ADULT (ION)     PRN Meds:.acetaminophen, albuterol, dextrose, fentaNYL (SUBLIMAZE) injection, lip balm, ondansetron (ZOFRAN) IV, sodium chloride flush    Objective: Weight change: -1.8 kg  Intake/Output Summary (Last 24 hours) at 02/10/2019 1415 Last data filed at 02/10/2019 1130 Gross per 24 hour  Intake 2385.85 ml  Output 2015 ml  Net 370.85 ml  Blood pressure (!) 94/41, pulse 96, temperature 98.8 F (37.1 C), resp. rate 13, height 5' 2"  (1.575 m), weight 63.2 kg, last menstrual period 03/29/2015, SpO2 100 %. Temp:  [96.1 F (35.6 C)-99.1 F (37.3 C)] 98.8 F (37.1 C) (11/30 1100) Pulse Rate:  [83-98] 96 (11/30 1100) Resp:  [13-27] 13 (11/30 1100) BP: (93-130)/(41-62) 94/41 (11/30 1100) SpO2:  [100 %] 100 % (11/30 0800) Weight:  [63.2 kg-63.8 kg] 63.2 kg (11/30 0500)  Physical Exam: General confused,  HEENT: anicteric sclera, EOMI IJ in place CVS tachycardic probable flow murmur Chest: , course breath sounds Abdomen: Distended drain in place Extremities: Bilateral below the knee amputations   Now with cyanotic areas see below 02/10/2019:       Neuro:  nonfocal  CBC:    BMET Recent Labs    02/09/19 0528 02/09/19 1627 02/10/19 0351  NA 133*  --  136  K 3.2* 3.7 3.1*  CL 96*  --  99  CO2 26  --  25  GLUCOSE 251*  --  143*  BUN 41*  --  36*  CREATININE 2.01*  --  1.77*  CALCIUM 7.2*  --  7.6*     Liver Panel  Recent Labs    02/10/19 0351  PROT 4.1*  ALBUMIN 1.2*  AST 15  ALT 19  ALKPHOS 57  BILITOT 0.6       Sedimentation Rate No results for input(s): ESRSEDRATE in the last 72 hours. C-Reactive Protein No results for input(s): CRP in the last 72 hours.  Micro Results: Recent Results (from the past 720 hour(s))  Antifungal AST 9 Drug Panel     Status: None   Collection Time: 02/02/19 12:00 AM  Result Value Ref Range Status   Organism ID, Yeast Candida glabrata  Corrected    Comment: (NOTE) Identification performed by account, not confirmed by this laboratory. CORRECTED ON 11/29 AT 1435: PREVIOUSLY REPORTED AS Preliminary report Specimen has been received and testing has been initiated.    Amphotericin B MIC 1.0 ug/mL  Final   Please Note: Comment  Final    Comment: (NOTE) CLSI does not have established guidelines for interpretation of these organism-drug combinations. For research use only.    Anidulafungin MIC Comment  Final    Comment: 0.12 ug/mL Susceptible   Caspofungin MIC Comment  Final    Comment: 0.12 ug/mL Susceptible   Micafungin MIC Comment  Final    Comment: 0.03 ug/mL Susceptible   Posaconazole MIC 2.0 ug/mL  Final   Please Note: Comment  Final    Comment: (NOTE) Results for this test are for research purposes only by the assay's manufacturer.  The performance characteristics of this product have not been established.  Results should not be used as a diagnostic procedure without confirmation of the diagnosis by another medically established diagnostic product or procedure.    Fluconazole Islt MIC 16.0 ug/mL  Final    Comment: Susceptible Dose Dependent   Flucytosine MIC 0.06  ug/mL or less  Final   Itraconazole MIC 1.0 ug/mL  Final   Voriconazole MIC 0.5 ug/mL  Final    Comment: (NOTE) Performed At: [facility] [place], [place] [id number] Rush Farmer MD LX:[id number]    Source BLOOD/ CANDIDA GLABRATA  Final    Comment: Performed at [facility], [place] [address], [place], [place] [address]  Urine culture     Status: Abnormal   Collection Time: 02/02/19  5:44 PM   Specimen: In/Out  Cath Urine  Result Value Ref Range Status   Specimen Description   Final    IN/OUT CATH URINE Performed at [facility] [address], [place], [place] [address]    Special Requests   Final    NONE Performed at [facility], [place] [address] [place], [place] [address]    Culture >=100,000 COLONIES/mL KLEBSIELLA PNEUMONIAE (A)  Final   Report Status 02/04/2019 FINAL  Final   Organism ID, Bacteria KLEBSIELLA PNEUMONIAE (A)  Final      Susceptibility   Klebsiella pneumoniae - MIC*    AMPICILLIN >=32 RESISTANT Resistant     CEFAZOLIN <=4 SENSITIVE Sensitive     CEFTRIAXONE <=1 SENSITIVE Sensitive     CIPROFLOXACIN <=0.25 SENSITIVE Sensitive     GENTAMICIN <=1 SENSITIVE Sensitive     IMIPENEM <=0.25 SENSITIVE Sensitive     NITROFURANTOIN 64 INTERMEDIATE Intermediate     TRIMETH/SULFA <=20 SENSITIVE Sensitive     AMPICILLIN/SULBACTAM 4 SENSITIVE Sensitive     PIP/TAZO <=4 SENSITIVE Sensitive     Extended ESBL NEGATIVE Sensitive     * >=100,000 COLONIES/mL KLEBSIELLA PNEUMONIAE  Blood Culture (routine x 2)     Status: Abnormal (Preliminary result)   Collection Time: 02/02/19  6:16 PM   Specimen: BLOOD  Result Value Ref Range Status   Specimen Description   Final    BLOOD LEFT ANTECUBITAL Performed at [facility] [address], [place], [place] [address]    Special Requests   Final    BOTTLES DRAWN AEROBIC AND ANAEROBIC Blood Culture adequate volume Performed at [facility] [address] [place], [place] [address]    Culture  Setup Time (A)  Final    YEAST ANAEROBIC BOTTLE ONLY CRITICAL RESULT CALLED TO, READ BACK BY AND VERIFIED WITH: Christean Grief PHARMD, AT [phone] 02/04/19 BY D. VANHOOK    Culture (A)  Final    CANDIDA GLABRATA Sent to [facility] for further susceptibility testing. Performed at [facility], [place] [address], [place], [place] [address]    Report Status PENDING  Incomplete  Blood Culture ID Panel (Reflexed)     Status: Abnormal   Collection Time: 02/02/19  6:16 PM  Result Value Ref Range Status   Enterococcus species NOT DETECTED NOT DETECTED Final   Listeria monocytogenes NOT DETECTED NOT DETECTED Final   Staphylococcus species NOT DETECTED NOT DETECTED Final   Staphylococcus aureus (BCID) NOT DETECTED NOT DETECTED Final   Streptococcus species NOT DETECTED NOT DETECTED Final   Streptococcus agalactiae NOT DETECTED NOT DETECTED Final   Streptococcus pneumoniae NOT DETECTED NOT DETECTED Final   Streptococcus pyogenes NOT DETECTED NOT DETECTED Final   Acinetobacter baumannii NOT DETECTED NOT DETECTED Final   Enterobacteriaceae species NOT DETECTED NOT DETECTED Final   Enterobacter cloacae complex NOT DETECTED NOT DETECTED Final   Escherichia coli NOT DETECTED NOT DETECTED Final   Klebsiella oxytoca NOT DETECTED NOT DETECTED Final   Klebsiella pneumoniae NOT DETECTED NOT DETECTED Final   Proteus species NOT DETECTED NOT DETECTED Final   Serratia marcescens NOT DETECTED NOT DETECTED Final   Haemophilus influenzae NOT DETECTED NOT DETECTED Final   Neisseria meningitidis NOT DETECTED NOT DETECTED Final   Pseudomonas aeruginosa NOT DETECTED NOT DETECTED Final   Candida albicans NOT DETECTED NOT DETECTED Final   Candida glabrata DETECTED (A) NOT DETECTED Final    Comment: CRITICAL RESULT CALLED TO, READ BACK BY AND VERIFIED WITH: Christean Grief [place], AT [phone]  02/04/19 BY D. VANHOOK    Candida krusei NOT DETECTED NOT  DETECTED Final   Candida parapsilosis NOT DETECTED NOT DETECTED Final   Candida tropicalis NOT DETECTED NOT DETECTED Final    Comment: Performed at [facility], [place] [address], [place], [place] [address]  SARS Coronavirus 2 by RT PCR (hospital order, performed in [facility] hospital lab) Nasopharyngeal Nasopharyngeal Swab     Status: None   Collection Time: 02/02/19  9:13 PM   Specimen: Nasopharyngeal Swab  Result Value Ref Range Status   SARS Coronavirus 2 NEGATIVE NEGATIVE Final    Comment: (NOTE) If result is NEGATIVE SARS-CoV-2 target nucleic acids are NOT DETECTED. The SARS-CoV-2 RNA is generally detectable in upper and lower  respiratory specimens during the acute phase of infection. The lowest  concentration of SARS-CoV-2 viral copies this assay can detect is 250  copies / mL. A negative result does not preclude SARS-CoV-2 infection  and should not be used as the sole basis for treatment or other  patient management decisions.  A negative result may occur with  improper specimen collection / handling, submission of specimen other  than nasopharyngeal swab, presence of viral mutation(s) within the  areas targeted by this assay, and inadequate number of viral copies  (<250 copies / mL). A negative result must be combined with clinical  observations, patient history, and epidemiological information. If result is POSITIVE SARS-CoV-2 target nucleic acids are DETECTED. The SARS-CoV-2 RNA is generally detectable in upper and lower  respiratory specimens dur ing the acute phase of infection.  Positive  results are indicative of active infection with SARS-CoV-2.  Clinical  correlation with patient history and other diagnostic information is  necessary to determine patient infection status.  Positive results do  not rule out bacterial infection or co-infection with other viruses. If result is PRESUMPTIVE POSTIVE SARS-CoV-2 nucleic acids MAY BE PRESENT.   A presumptive positive  result was obtained on the submitted specimen  and confirmed on repeat testing.  While 2019 novel coronavirus  (SARS-CoV-2) nucleic acids may be present in the submitted sample  additional confirmatory testing may be necessary for epidemiological  and / or clinical management purposes  to differentiate between  SARS-CoV-2 and other Sarbecovirus currently known to infect humans.  If clinically indicated additional testing with an alternate test  methodology [phone]) is advised. The SARS-CoV-2 RNA is generally  detectable in upper and lower respiratory sp ecimens during the acute  phase of infection. The expected result is Negative. Fact Sheet for Patients:  StrictlyIdeas.no Fact Sheet for Healthcare Providers: BankingDealers.co.za This test is not yet approved or cleared by the [place] FDA and has been authorized for detection and/or diagnosis of SARS-CoV-2 by FDA under an Emergency Use Authorization (EUA).  This EUA will remain in effect (meaning this test can be used) for the duration of the COVID-19 declaration under Section 564(b)(1) of the Act, 21 U.S.C. section 360bbb-3(b)(1), unless the authorization is terminated or revoked sooner. Performed at [facility], [place] [address], [place], [place] [address]   Aerobic/Anaerobic Culture (surgical/deep wound)     Status: None   Collection Time: 02/03/19  2:25 AM   Specimen: Peritoneal; Body Fluid  Result Value Ref Range Status   Specimen Description   Final    PERITONEAL Performed at [facility] [address], [place], [place] [address]    Special Requests   Final    NONE Performed at [facility], [address] [place], [place] [address]    Gram Stain NO WBC SEEN RARE BUDDING YEAST SEEN   Final   Culture   Final    ABUNDANT CANDIDA ALBICANS MODERATE CANDIDA GLABRATA NO ANAEROBES ISOLATED Performed at [facility], [place] [address] [place], [place] [address]    Report Status 02/08/2019 FINAL  Final  MRSA PCR Screening     Status: None   Collection Time: 02/03/19  2:53 AM   Specimen: Nasopharyngeal  Result Value Ref Range Status   MRSA by PCR NEGATIVE NEGATIVE Final    Comment:        The GeneXpert MRSA Assay (FDA approved for NASAL specimens only), is one component of a comprehensive MRSA colonization surveillance program. It is not intended to diagnose MRSA infection nor to guide or monitor treatment for MRSA infections. Performed at [facility], [place] [address], [place], [place] [address]   Culture, blood (routine x 2)     Status: None   Collection Time: 02/04/19 11:02 AM   Specimen: BLOOD RIGHT HAND  Result Value Ref Range Status   Specimen Description   Final    BLOOD RIGHT HAND Performed at [facility], [place] [address], [place], [place] [address]    Special Requests   Final    BOTTLES DRAWN AEROBIC ONLY Blood Culture results may not be optimal due to an inadequate volume of blood received in culture bottles Performed at [facility] [address] [place], [place] [address]    Culture   Final    NO GROWTH 5 DAYS Performed at [facility], [place] [address], [place], [place] [address]    Report Status 02/09/2019 FINAL  Final  Culture, blood (routine x 2)     Status: None   Collection Time: 02/04/19 11:25 AM   Specimen: BLOOD  Result Value Ref Range Status   Specimen Description   Final    BLOOD RIGHT ARM Performed at [facility] [address] [place], [place] [address]    Special Requests   Final    BOTTLES DRAWN AEROBIC AND ANAEROBIC Blood Culture results may not be optimal due to an inadequate volume of blood received in culture bottles Performed at [facility] [address] [place], [place] [address]    Culture   Final    NO GROWTH 5 DAYS Performed at [facility], [place] [address], [place], [place] [address]    Report Status 02/09/2019 FINAL  Final  Culture, blood (routine x 2)     Status: None (Preliminary result)   Collection Time: 02/08/19 12:13 PM   Specimen: BLOOD  Result Value Ref Range Status   Specimen Description   Final    BLOOD RIGHT WRIST Performed at [facility] [address], [place], [place] [address]    Special Requests   Final    BOTTLES DRAWN AEROBIC AND ANAEROBIC Blood Culture adequate volume Performed at [facility] [address], [place], [place] [address]    Culture   Final    NO GROWTH 2 DAYS Performed at [facility] [address], [place],  [address]    Report Status PENDING  Incomplete  Culture, blood (routine x 2)     Status: None (Preliminary result)   Collection Time: 02/08/19 12:28 PM   Specimen: BLOOD  Result Value Ref Range Status   Specimen Description  Final    BLOOD RIGHT ARM Performed at [facility] [address], [place], [place] [address]    Special Requests   Final    BOTTLES DRAWN AEROBIC ONLY Blood Culture results may not be optimal due to an inadequate volume of blood received in culture bottles Performed at [facility] [address] [place], [place] [address]    Culture   Final    NO GROWTH 2 DAYS Performed at [facility] [address], [place], [place] [address]    Report Status PENDING  Incomplete    Studies/Results: Dg Abd 1 View  Result Date: 02/09/2019 CLINICAL DATA:  NG tube placement EXAM: ABDOMEN - 1 VIEW COMPARISON:  None. FINDINGS: Minimal bibasilar atelectasis. Enteric tube tip and side-port project over the stomach. Gaseous distended loops of small bowel within the central abdomen. IMPRESSION: Enteric tube tip and side-port project over the stomach. Electronically Signed   By: Lovey Newcomer M.D.   On: 02/09/2019 14:50      Assessment/Plan:  INTERVAL  HISTORY:   Patient extubated, TPN being considered and IJ still in place, ? pressor necrosis of extremities   Principal Problem:   Septic shock ([place]) Active Problems:   DKA (diabetic ketoacidoses) ([place])   Schizophrenia ([place])   Acute kidney injury ([place])   Pressure injury of skin   Perforated gastric ulcer ([place])   Acute respiratory failure ([facility])    Gina [name] is a 58 y.o. 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  She has Candida glabrata fungal EMEA.  She has been seen by Dr. Baird Cancer ophthalmology and does not have evidence of endogenous fungal endophthalmitis.   She is extubated and off of levophed but with low BP still and central line in place  #1 Sepsis from gastric perforation.  She is growing Candida glabrata from blood.  Would continue her current broad-spectrum coverage with Zosyn and Eraxis.  She will need repeat imaging prior to DC antimicrobials  #2 Candida GLABRATA fungemia  NOTE SHE NEEDS CENTRAL LINE HOLIDAY IN ORDER TO GET TO DAY #1 OF 14 DAYS OF ANTIFUNGAL THERAPY  The central line and TPN will definitely NOT help with getting rid of candida that is undoubtedly coating her central line  When able please DC central lines and then repeat blood cultures and have her without central lines for minimum of 24-48 hours  Dr. Baxter Flattery to take over the service tomorrow.     LOS: 8 days   Alcide Evener 02/10/2019, 2:15 PM

## 2019-02-10 NOTE — Progress Notes (Signed)
Pharmacy Antibiotic Note  Gina [name] is a 58 y.o. female admitted on 02/02/2019 with septic shock secondary to peritonitis with perforated viscus. Blood cx + C. Glabrata.  Pharmacy has been consulted for Zosyn and Eraxis dosing.  02/10/2019:  Day 8 Zosyn, Day 7 Eraxis  Tmax 99.1  CKD- renal function improved, CrCl>55ml/min  Plan: Continue Zosyn 3.375g IV q8h (each dose infused over 4 hours).  Continue Eraxis 100mg  IV daily Monitor renal function, cultures, clinical course.  Height: 5\' 2"  (157.5 cm) Weight: 139 lb 5.3 oz (63.2 kg) IBW/kg (Calculated) : 50.1  Temp (24hrs), Avg:97.5 F (36.4 C), Min:96.1 F (35.6 C), Max:99.1 F (37.3 C)  Recent Labs  Lab 02/04/19 0120  02/04/19 1125  02/06/19 0147  02/07/19 0348 02/07/19 1609 02/08/19 0409 02/09/19 0528 02/10/19 0351  WBC 5.9  --   --    < > 3.0*  --  3.3*  --  4.5 6.2 5.9  CREATININE 2.22*   < >  --    < > 2.66*   < > 2.52* 2.49* 2.38* 2.01* 1.77*  LATICACIDVEN 7.3*  --  5.1*  --   --   --   --   --   --   --   --    < > = values in this interval not displayed.    Estimated Creatinine Clearance: 30.6 mL/min (A) (by C-G formula based on SCr of 1.77 mg/dL (H)).    Allergies  Allergen Reactions  . Bee Venom Swelling    Swells up with bee stings   . Metformin And Related Other (See Comments)    Upset stomach    Antimicrobials this admission: 11/22 Vancomycin >> 11/23 11/22 Cefepime x 1 11/22 Metronidazole x 1 11/22 Fluconazole >> 11/24 11/23 Zosyn >> 11/24 eraxis >>  Dose adjustments this admission:   Microbiology results: 11/22 BCx x1: 1/2 yeast (BCID + C glabrata-sens pending) 11/22 UCx: Klebsiella (R to ampicillin, I to nitro) 11/22 COVID: negative 11/23 peritoneal culture: C.albicans, C glabrata 11/23 MRSA PCR: negative 11/24 BCx x2: NG-final 11/28: Bc x2: ngtd  Thank you for allowing pharmacy to be a part of this patient's care.  Minda Ditto, PharmD 02/10/2019 11:31 AM

## 2019-02-11 DIAGNOSIS — I96 Gangrene, not elsewhere classified: Secondary | ICD-10-CM

## 2019-02-11 DIAGNOSIS — T80219D Unspecified infection due to central venous catheter, subsequent encounter: Secondary | ICD-10-CM

## 2019-02-11 LAB — GLUCOSE, CAPILLARY
Glucose-Capillary: 14 mg/dL — CL (ref 70–99)
Glucose-Capillary: 29 mg/dL — CL (ref 70–99)
Glucose-Capillary: 186 mg/dL — ABNORMAL HIGH (ref 70–99)
Glucose-Capillary: 224 mg/dL — ABNORMAL HIGH (ref 70–99)
Glucose-Capillary: 198 mg/dL — ABNORMAL HIGH (ref 70–99)
Glucose-Capillary: 229 mg/dL — ABNORMAL HIGH (ref 70–99)
Glucose-Capillary: 173 mg/dL — ABNORMAL HIGH (ref 70–99)

## 2019-02-11 LAB — BASIC METABOLIC PANEL
Anion gap: 9 (ref 5–15)
BUN: 33 mg/dL — ABNORMAL HIGH (ref 6–20)
CO2: 24 mmol/L (ref 22–32)
Calcium: 7.5 mg/dL — ABNORMAL LOW (ref 8.9–10.3)
Chloride: 103 mmol/L (ref 98–111)
Creatinine, Ser: 1.5 mg/dL — ABNORMAL HIGH (ref 0.44–1.00)
GFR calc Af Amer: 44 mL/min — ABNORMAL LOW (ref >60)
GFR calc non Af Amer: 38 mL/min — ABNORMAL LOW (ref >60)
Glucose, Bld: 199 mg/dL — ABNORMAL HIGH (ref 70–99)
Potassium: 3.4 mmol/L — ABNORMAL LOW (ref 3.5–5.1)
Sodium: 136 mmol/L (ref 135–145)

## 2019-02-11 LAB — RETICULOCYTES
Immature Retic Fract: 23.1 % — ABNORMAL HIGH (ref 2.3–15.9)
RBC.: 2.49 MIL/uL — ABNORMAL LOW (ref 3.87–5.11)
Retic Count, Absolute: 30.1 10*3/uL (ref 19.0–186.0)
Retic Ct Pct: 1.2 % (ref 0.4–3.1)

## 2019-02-11 LAB — CBC
HCT: 22.8 % — ABNORMAL LOW (ref 36.0–46.0)
Hemoglobin: 7.2 g/dL — ABNORMAL LOW (ref 12.0–15.0)
MCH: 28.9 pg (ref 26.0–34.0)
MCHC: 31.6 g/dL (ref 30.0–36.0)
MCV: 91.6 fL (ref 80.0–100.0)
Platelets: 161 10*3/uL (ref 150–400)
RBC: 2.49 MIL/uL — ABNORMAL LOW (ref 3.87–5.11)
RDW: 15.2 % (ref 11.5–15.5)
WBC: 6.6 10*3/uL (ref 4.0–10.5)
nRBC: 0 % (ref 0.0–0.2)

## 2019-02-11 LAB — MAGNESIUM: Magnesium: 2 mg/dL (ref 1.7–2.4)

## 2019-02-11 LAB — IRON AND TIBC
Iron: 14 ug/dL — ABNORMAL LOW (ref 28–170)
Saturation Ratios: 13 % (ref 10.4–31.8)
TIBC: 106 ug/dL — ABNORMAL LOW (ref 250–450)
UIBC: 92 ug/dL

## 2019-02-11 LAB — PHOSPHORUS: Phosphorus: 3 mg/dL (ref 2.5–4.6)

## 2019-02-11 LAB — FOLATE: Folate: 10.2 ng/mL (ref >5.9)

## 2019-02-11 LAB — FERRITIN: Ferritin: 304 ng/mL (ref 11–307)

## 2019-02-11 MED ORDER — POTASSIUM CHLORIDE 10 MEQ/100ML IV SOLN
10.0000 meq | INTRAVENOUS | Status: AC
  Administered 2019-02-11 (×3): 10 meq via INTRAVENOUS
  Filled 2019-02-11: qty 100

## 2019-02-11 MED ORDER — SODIUM CHLORIDE 4 MEQ/ML IV SOLN
INTRAVENOUS | Status: AC
  Administered 2019-02-11 – 2019-02-12 (×2): via INTRAVENOUS
  Filled 2019-02-11 (×9): qty 1000

## 2019-02-11 MED ORDER — FENTANYL CITRATE (PF) 100 MCG/2ML IJ SOLN
25.0000 ug | INTRAMUSCULAR | Status: DC | PRN
  Filled 2019-02-11: qty 2

## 2019-02-11 MED ORDER — INSULIN ASPART 100 UNIT/ML ~~LOC~~ SOLN
0.0000 [IU] | Freq: Four times a day (QID) | SUBCUTANEOUS | Status: DC
  Administered 2019-02-11: 2 [IU] via SUBCUTANEOUS
  Administered 2019-02-11 – 2019-02-12 (×2): 5 [IU] via SUBCUTANEOUS
  Administered 2019-02-12: 8 [IU] via SUBCUTANEOUS
  Administered 2019-02-12: 2 [IU] via SUBCUTANEOUS
  Administered 2019-02-13: 5 [IU] via SUBCUTANEOUS
  Administered 2019-02-13: 8 [IU] via SUBCUTANEOUS
  Administered 2019-02-13: 2 [IU] via SUBCUTANEOUS
  Administered 2019-02-14 (×4): 3 [IU] via SUBCUTANEOUS
  Administered 2019-02-15: 11 [IU] via SUBCUTANEOUS

## 2019-02-11 NOTE — Plan of Care (Signed)
RN will continue to monitor patient's progression of care plan.  

## 2019-02-11 NOTE — Progress Notes (Signed)
[facility] for Infectious Disease    Date of Admission:  02/02/2019   Total days of antibiotics 10 anidula/piptazo           ID: Gina [name] is a 58 y.o. female with sepsis due to intra-abdominal infection 2/2 gastric ulcer perforation c/b candidemia Principal Problem:   Septic shock ([place]) Active Problems:   DKA (diabetic ketoacidoses) ([place])   Schizophrenia ([place])   Acute kidney injury ([place])   Pressure injury of skin   Perforated gastric ulcer ([place])   Acute respiratory failure ([facility])    Subjective: Afebrile. Getting line holiday. Still has output per NG -remains NPO. Patient reports feeling poorly  Medications:   sodium chloride   Intravenous Once   chlorhexidine gluconate (MEDLINE KIT)  15 mL Mouth Rinse BID   Chlorhexidine Gluconate Cloth  6 each Topical Daily   Gerhardt's butt cream   Topical TID   heparin injection (subcutaneous)  5,000 Units Subcutaneous Q8H   pantoprazole (PROTONIX) IV  40 mg Intravenous Q12H   sodium chloride flush  10-40 mL Intracatheter Q12H    Objective: Vital signs in last 24 hours: Temp:  [97.4 F (36.3 C)-98.3 F (36.8 C)] 98 F (36.7 C) (12/01 1200) Pulse Rate:  [87-99] 96 (12/01 1200) Resp:  [15-22] 18 (12/01 1200) BP: (94-142)/(47-87) 117/56 (12/01 1200) SpO2:  [100 %] 100 % (12/01 1200) Weight:  [63.7 kg] 63.7 kg (12/01 0441) Physical Exam  Constitutional:  oriented to person, place, and time. appears well-developed and well-nourished. No distress.  HENT: [place]/AT, PERRLA, no scleral icterus, ng in place Mouth/Throat: Oropharynx is clear and moist. No oropharyngeal exudate.  Cardiovascular: Normal rate, regular rhythm and normal heart sounds. Exam reveals no gallop and no friction rub.  No murmur heard.  Pulmonary/Chest: Effort normal and breath sounds normal. No respiratory distress.  has no wheezes.  Neck = supple, no nuchal rigidity Abdominal: Soft. Bowel sounds are decreased.  exhibits no distension. There is no  tenderness.  Ext= bilateral aka, Skin = bullae to lower extremities and hand/arms with desquamation   Lab Results Recent Labs    02/10/19 0351 02/11/19 0431  WBC 5.9 6.6  HGB 7.3* 7.2*  HCT 22.8* 22.8*  NA 136 136  K 3.1* 3.4*  CL 99 103  CO2 25 24  BUN 36* 33*  CREATININE 1.77* 1.50*   Liver Panel Recent Labs    02/10/19 0351  PROT 4.1*  ALBUMIN 1.2*  AST 15  ALT 19  ALKPHOS 57  BILITOT 0.6   Sedimentation Rate No results for input(s): ESRSEDRATE in the last 72 hours. C-Reactive Protein No results for input(s): CRP in the last 72 hours.  Microbiology:  Studies/Results: Ct Abdomen Wo Contrast  Result Date: 02/10/2019 CLINICAL DATA:  Eval for perforation. EXAM: CT ABDOMEN WITHOUT CONTRAST TECHNIQUE: Multidetector CT imaging of the abdomen was performed following the standard protocol without IV contrast. COMPARISON:  Upper GI evaluation of the same date and CT abdomen and pelvis of 02/02/2019. FINDINGS: Lower chest: Basilar collapse/consolidation with small bilateral pleural effusions. No signs of pericardial effusion Hepatobiliary: Loculated appearing fluid along the a Paddock margin measures approximately 7 by 2.8 cm scalloping the liver margin. Limited assessment a left hepatic lobe due to extreme streak artifact from recently administered gastrointestinal contrast. Signs of cholelithiasis. Pancreas: Not well evaluated. No gross abnormality within the pancreatic bed. Spleen: Not well evaluated due to streak artifact. Adrenals/Urinary Tract: Normal adrenal glands. No signs of hydronephrosis. Stomach/Bowel: Dense contrast material administered during  an upper GI procedure. Irregularity of the antral pyloric stomach compatible likely related to repair of gastric ulcer, surgical drain terminates anterior to the antro pyloric stomach and exits via right sub hepatic abdominal wall. There is a question of very subtle added density within the lumen of the drainage catheter. The  density of material surrounding the stomach does not show definitive evidence of a leak however. Mixing of contrast, the small amount of contrast that did pass the level of the ulcer repair is noted within the lumen of distended small bowel loops becoming more dilute distally. There are numerous distended small bowel loops. Since the previous study there has been resolution of the diffuse enteric pneumatosis. Small amount of fluid is seen along the anterior margin of the abdomen just deep to the abdominal wall, anterior to dilated loops, measuring approximately 8 mm in greatest thickness deep to the left rectus muscle (image 43, series 2. Vascular/Lymphatic: Calcified atherosclerotic changes throughout the abdominal aorta. No signs of adenopathy. Other: Perihepatic fluid with loculated appearance. Dilated loops of bowel described above, associated with mesenteric edema and a small amount of peritoneal fluid. Musculoskeletal: No signs of acute bone finding or evidence of destructive bone process. IMPRESSION: 1. No signs of gross leak; however, there is a question of very subtle added density within the lumen of the drain, raising the question of a tiny leak that cannot be confirmed by the presence of a collection of extraluminal contrast. Suggest close follow-up perhaps with repeat CT utilizing dilute water-soluble contrast administered via existing gastric tube after resolution of pyloric edema that is seen on today's study. This presumed edema restricts flow of the contrast despite oblique positioning of the patient during the fluoroscopic exam. 2. Interval resolution of the diffuse enteric pneumatosis now with diffuse presumed ileus. Continued attention on follow-up is suggested. 3. Postoperative perihepatic fluid collection with lenticular contour compatible with loculated fluid. 4. Small amount of fluid along the anterior margin of the abdomen just deep to the left rectus muscle, measuring approximately 8 mm in  greatest thickness. 5. Basilar collapse/consolidation with small bilateral pleural effusions. 6. These results will be called to the ordering clinician or representative by the Radiologist Assistant, and communication documented in the PACS or zVision Dashboard. Aortic Atherosclerosis (ICD10-I70.0). Electronically Signed   By: Zetta Bills M.D.   On: 02/10/2019 17:31   Dg Duanne Limerick W Single Cm (sol Or Thin Ba)  Result Date: 02/10/2019 CLINICAL DATA:  History of perforated gastric ulcer. EXAM: WATER SOLUBLE UPPER GI SERIES TECHNIQUE: Single-column upper GI series was performed using water soluble contrast. CONTRAST:  100 cc Omnipaque administered per existing G-tube period. COMPARISON:  CT abdomen and pelvis from 02/02/2019. FLUOROSCOPY TIME:  Fluoroscopy Time:  2 minutes 18 seconds Radiation Exposure Index (if provided by the fluoroscopic device): 20.4 mGy Number of Acquired Spot Images: 4 FINDINGS: Scout image obtained shows a G-tube coiled in the stomach and a surgical drain entering the image from the right likely terminating in the area around the repair site. Bowel distension in the upper abdomen is noted, small bowel loops reaching caliber near the 2-1/2 to 3 cm. Slow injection of iodinated contrast material pooled in the stomach, confined mainly to the fundus and body of stomach despite attempts at oblique positioning. This was complicated by the patient's debilitated state and recent surgery. Limited contrast passed through the pyloric channel. Irregularity of this channel likely relates to the repair and 2 significant edema in the area. IMPRESSION: 1. Presumed bowel edema  following repair, limiting flow of contrast from the stomach into the small bowel, for this reason the exam the subsequent CT or limited with respect to detection of small leaks. There is no gross leak on the images which were obtained for today's study. 2. The patient was taken to CT following this procedure in an attempt to localize  smaller amounts of I had and 80 contrast which might be present but not detectable by fluoroscopy. Please see dedicated report for further detail. 3. Signs of presumed ileus.  Suggest attention on follow-up. Electronically Signed   By: Zetta Bills M.D.   On: 02/10/2019 17:36     Assessment/Plan:   58 y.o. 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     [facility] for Infectious Diseases Cell: [phone] Pager: [phone]  02/11/2019, 3:36 PM

## 2019-02-11 NOTE — Progress Notes (Addendum)
Patient ID: Gina [name], female   DOB: 1960-05-30, 58 y.o.   MRN: [id number]    9 Days Post-Op  Subjective: Patient is somnolent.  RN states this is how she has been since yesterday.  She does slightly arouse when her name is called, but doesn't answer much in the way of questions for me.  Apparently she recently answered the RN's questions and was oriented x4.  Started having some liquid BMs overnight, but still have significant NGT output.  ROS: unable due to AMS  Objective: Vital signs in last 24 hours: Temp:  [97.4 F (36.3 C)-99.1 F (37.3 C)] 98.3 F (36.8 C) (12/01 0400) Pulse Rate:  [87-99] 90 (12/01 0800) Resp:  [13-27] 22 (12/01 0800) BP: (94-142)/(41-87) 108/57 (12/01 0800) SpO2:  [100 %] 100 % (12/01 0400) Weight:  [63.7 kg] 63.7 kg (12/01 0441) Last BM Date: 02/11/19  Intake/Output from previous day: 11/30 0701 - 12/01 0700 In: 1913.9 [I.V.:1370.6; IV Piggyback:543.3] Out: 1695 [Urine:325; Emesis/NG output:1300; Drains:70] Intake/Output this shift: Total I/O In: 131.3 [I.V.:119.2; IV Piggyback:12.1] Out: 800 [Emesis/NG output:800]  PE: Gen: somnolent Heart: regular, +murmur Lungs: CTAB Abd: soft, midline wound is clean and open.  Fascia is intact.  Drain with minimal output, appears mostly serous, but is slightly golden.  Few BS  NGT with frankly bilious output.  Lab Results:  Recent Labs    02/10/19 0351 02/11/19 0431  WBC 5.9 6.6  HGB 7.3* 7.2*  HCT 22.8* 22.8*  PLT 110* 161   BMET Recent Labs    02/10/19 0351 02/11/19 0431  NA 136 136  K 3.1* 3.4*  CL 99 103  CO2 25 24  GLUCOSE 143* 199*  BUN 36* 33*  CREATININE 1.77* 1.50*  CALCIUM 7.6* 7.5*   PT/INR No results for input(s): LABPROT, INR in the last 72 hours. CMP     Component Value Date/Time   NA 136 02/11/2019 0431   NA 139 10/04/2017 1705   K 3.4 (L) 02/11/2019 0431   CL 103 02/11/2019 0431   CO2 24 02/11/2019 0431   GLUCOSE 199 (H) 02/11/2019 0431   BUN 33 (H) 02/11/2019  0431   BUN 11 10/04/2017 1705   CREATININE 1.50 (H) 02/11/2019 0431   CREATININE 0.68 04/28/2016 1640   CALCIUM 7.5 (L) 02/11/2019 0431   PROT 4.1 (L) 02/10/2019 0351   PROT 7.1 10/04/2017 1705   ALBUMIN 1.2 (L) 02/10/2019 0351   ALBUMIN 3.7 10/04/2017 1705   AST 15 02/10/2019 0351   ALT 19 02/10/2019 0351   ALKPHOS 57 02/10/2019 0351   BILITOT 0.6 02/10/2019 0351   BILITOT <0.2 10/04/2017 1705   GFRNONAA 38 (L) 02/11/2019 0431   GFRNONAA >89 04/28/2016 1640   GFRAA 44 (L) 02/11/2019 0431   GFRAA >89 04/28/2016 1640   Lipase     Component Value Date/Time   LIPASE 27 04/01/2016 1040       Studies/Results: Ct Abdomen Wo Contrast  Result Date: 02/10/2019 CLINICAL DATA:  Eval for perforation. EXAM: CT ABDOMEN WITHOUT CONTRAST TECHNIQUE: Multidetector CT imaging of the abdomen was performed following the standard protocol without IV contrast. COMPARISON:  Upper GI evaluation of the same date and CT abdomen and pelvis of 02/02/2019. FINDINGS: Lower chest: Basilar collapse/consolidation with small bilateral pleural effusions. No signs of pericardial effusion Hepatobiliary: Loculated appearing fluid along the a Paddock margin measures approximately 7 by 2.8 cm scalloping the liver margin. Limited assessment a left hepatic lobe due to extreme streak artifact from recently administered gastrointestinal contrast.  Signs of cholelithiasis. Pancreas: Not well evaluated. No gross abnormality within the pancreatic bed. Spleen: Not well evaluated due to streak artifact. Adrenals/Urinary Tract: Normal adrenal glands. No signs of hydronephrosis. Stomach/Bowel: Dense contrast material administered during an upper GI procedure. Irregularity of the antral pyloric stomach compatible likely related to repair of gastric ulcer, surgical drain terminates anterior to the antro pyloric stomach and exits via right sub hepatic abdominal wall. There is a question of very subtle added density within the lumen of the  drainage catheter. The density of material surrounding the stomach does not show definitive evidence of a leak however. Mixing of contrast, the small amount of contrast that did pass the level of the ulcer repair is noted within the lumen of distended small bowel loops becoming more dilute distally. There are numerous distended small bowel loops. Since the previous study there has been resolution of the diffuse enteric pneumatosis. Small amount of fluid is seen along the anterior margin of the abdomen just deep to the abdominal wall, anterior to dilated loops, measuring approximately 8 mm in greatest thickness deep to the left rectus muscle (image 43, series 2. Vascular/Lymphatic: Calcified atherosclerotic changes throughout the abdominal aorta. No signs of adenopathy. Other: Perihepatic fluid with loculated appearance. Dilated loops of bowel described above, associated with mesenteric edema and a small amount of peritoneal fluid. Musculoskeletal: No signs of acute bone finding or evidence of destructive bone process. IMPRESSION: 1. No signs of gross leak; however, there is a question of very subtle added density within the lumen of the drain, raising the question of a tiny leak that cannot be confirmed by the presence of a collection of extraluminal contrast. Suggest close follow-up perhaps with repeat CT utilizing dilute water-soluble contrast administered via existing gastric tube after resolution of pyloric edema that is seen on today's study. This presumed edema restricts flow of the contrast despite oblique positioning of the patient during the fluoroscopic exam. 2. Interval resolution of the diffuse enteric pneumatosis now with diffuse presumed ileus. Continued attention on follow-up is suggested. 3. Postoperative perihepatic fluid collection with lenticular contour compatible with loculated fluid. 4. Small amount of fluid along the anterior margin of the abdomen just deep to the left rectus muscle, measuring  approximately 8 mm in greatest thickness. 5. Basilar collapse/consolidation with small bilateral pleural effusions. 6. These results will be called to the ordering clinician or representative by the Radiologist Assistant, and communication documented in the PACS or zVision Dashboard. Aortic Atherosclerosis (ICD10-I70.0). Electronically Signed   By: Zetta Bills M.D.   On: 02/10/2019 17:31   Dg Abd 1 View  Result Date: 02/09/2019 CLINICAL DATA:  NG tube placement EXAM: ABDOMEN - 1 VIEW COMPARISON:  None. FINDINGS: Minimal bibasilar atelectasis. Enteric tube tip and side-port project over the stomach. Gaseous distended loops of small bowel within the central abdomen. IMPRESSION: Enteric tube tip and side-port project over the stomach. Electronically Signed   By: Lovey Newcomer M.D.   On: 02/09/2019 14:50   Dg Duanne Limerick W Single Cm (sol Or Thin Ba)  Result Date: 02/10/2019 CLINICAL DATA:  History of perforated gastric ulcer. EXAM: WATER SOLUBLE UPPER GI SERIES TECHNIQUE: Single-column upper GI series was performed using water soluble contrast. CONTRAST:  100 cc Omnipaque administered per existing G-tube period. COMPARISON:  CT abdomen and pelvis from 02/02/2019. FLUOROSCOPY TIME:  Fluoroscopy Time:  2 minutes 18 seconds Radiation Exposure Index (if provided by the fluoroscopic device): 20.4 mGy Number of Acquired Spot Images: 4 FINDINGS: Risk analyst obtained  shows a G-tube coiled in the stomach and a surgical drain entering the image from the right likely terminating in the area around the repair site. Bowel distension in the upper abdomen is noted, small bowel loops reaching caliber near the 2-1/2 to 3 cm. Slow injection of iodinated contrast material pooled in the stomach, confined mainly to the fundus and body of stomach despite attempts at oblique positioning. This was complicated by the patient's debilitated state and recent surgery. Limited contrast passed through the pyloric channel. Irregularity of this  channel likely relates to the repair and 2 significant edema in the area. IMPRESSION: 1. Presumed bowel edema following repair, limiting flow of contrast from the stomach into the small bowel, for this reason the exam the subsequent CT or limited with respect to detection of small leaks. There is no gross leak on the images which were obtained for today's study. 2. The patient was taken to CT following this procedure in an attempt to localize smaller amounts of I had and 80 contrast which might be present but not detectable by fluoroscopy. Please see dedicated report for further detail. 3. Signs of presumed ileus.  Suggest attention on follow-up. Electronically Signed   By: Zetta Bills M.D.   On: 02/10/2019 17:36    Anti-infectives: Anti-infectives (From admission, onward)   Start     Dose/Rate Route Frequency Ordered Stop   02/05/19 1000  anidulafungin (ERAXIS) 100 mg in sodium chloride 0.9 % 100 mL IVPB     100 mg 78 mL/hr over 100 Minutes Intravenous Every 24 hours 02/04/19 0840     02/04/19 1000  anidulafungin (ERAXIS) 200 mg in sodium chloride 0.9 % 200 mL IVPB     200 mg 78 mL/hr over 200 Minutes Intravenous  Once 02/04/19 0840 02/04/19 1301   02/04/19 0200  fluconazole (DIFLUCAN) IVPB 200 mg  Status:  Discontinued     200 mg 100 mL/hr over 60 Minutes Intravenous Every 24 hours 02/02/19 2302 02/04/19 0840   02/03/19 1800  vancomycin (VANCOCIN) IVPB 750 mg/150 ml premix  Status:  Discontinued     750 mg 150 mL/hr over 60 Minutes Intravenous Every 24 hours 02/03/19 0241 02/03/19 0242   02/03/19 1800  vancomycin (VANCOCIN) IVPB 750 mg/150 ml premix  Status:  Discontinued     750 mg 150 mL/hr over 60 Minutes Intravenous Every 24 hours 02/03/19 0242 02/03/19 0858   02/03/19 0600  piperacillin-tazobactam (ZOSYN) IVPB 3.375 g     3.375 g 12.5 mL/hr over 240 Minutes Intravenous Every 8 hours 02/03/19 0243     02/02/19 2315  fluconazole (DIFLUCAN) IVPB 400 mg     400 mg 100 mL/hr over 120  Minutes Intravenous STAT 02/02/19 2301 02/03/19 0218   02/02/19 1800  ceFEPIme (MAXIPIME) 2 g in sodium chloride 0.9 % 100 mL IVPB     2 g 200 mL/hr over 30 Minutes Intravenous  Once 02/02/19 1745 02/02/19 2030   02/02/19 1800  metroNIDAZOLE (FLAGYL) IVPB 500 mg     500 mg 100 mL/hr over 60 Minutes Intravenous  Once 02/02/19 1745 02/02/19 2237   02/02/19 1800  vancomycin (VANCOCIN) IVPB 1000 mg/200 mL premix  Status:  Discontinued     1,000 mg 200 mL/hr over 60 Minutes Intravenous  Once 02/02/19 1745 02/02/19 1747   02/02/19 1800  vancomycin (VANCOCIN) 1,250 mg in sodium chloride 0.9 % 250 mL IVPB     1,250 mg 166.7 mL/hr over 90 Minutes Intravenous  Once 02/02/19 1747 02/02/19 2237  Assessment/Plan VDRF - extubated 11/29 Poorly controlled DM2 - A1c15.2 S/p bilateral BKA Acute on CKD  HTN Schizophrenia DKA Leukopenia Severe malnutrition, Prealbumin <5 on 11/30 Anemia  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 Need for Dr. Sharol Given to re-evaluate Candida glabratabacteremia - ok to DC CLs from surgery standpoint.    Agree to stop TNA given high risk of progression of fungemia in the setting of CLs and TNA.  POD 9, S/p ex lap with oversew of duodenal ulcer, with [name] patch 11/23, Dr. Lucia Gaskins, for perforated prepyloric ulcer with gross contamination - BID wet to dry dressing changes to midline abdominal wound - pulm toilet and IS -OOB to chair as much as possible -UGI and CT scan reviewed.  CT questions a possible tiny leak, but isn't clear.  Unfortunately her UGI shows significant edema causing contrast to not excrete through the pylorus.  This explains her high NGT output which does not allow [place] to feed her enterally. -because of her fungemia she will need a line holiday and will be without nutrition for 24-48 hrs, per ID, before a line can be replaced and TNA can be  restarted. -will follow NGT output, etc to determine when edema is resolving and we can given her TFs or removal NGT and allow her to eat. -PT/OT eval ordered  ID - Eraxis 11/24>>    Zosyn 11/23 >>  WBC 5.9, afebrile  FEN - NPO/NGT VTE - NoSCDs,Bilateral BKa's - sq heparin  Foley - out Follow up - Dr. Lucia Gaskins   LOS: 9 days   Henreitta Cea , [facility] 02/11/2019, 9:15 AM Please see Amion for pager number during day hours 7:00am-4:30pm  Agree with above. We are stuck treating her fungemia - which means removing lines and holding TPN. The edema around her ulcer/perforation will probably not improve much until we get some nutrition in her.  And she is putting out enough from the NGT, it is not worth clamping it.  Her BKA's do not look good, but are no worse than yesterday.  She is less responsive today for unclear reasons.  Alphonsa Overall, MD, [facility] Office phone:  [phone]

## 2019-02-11 NOTE — Progress Notes (Addendum)
PROGRESS NOTE    Gina [name]   AVW:[id number]  DOB: 1960-12-30  DOA: 02/02/2019 PCP: [name], MD   Brief Narrative:  Gina [name] is a 58 y.o. 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 In the ED, she was hypothermic and severely hypotensive. Initial blood glucose >1000.   CT abdomen showed pneumoperitoneum and mesenteric ischemia. Admitted to ICU service and started on IV antibiotics.   11/23 > She underwent exploratory Laparotomy with finding of perforated prepyloric ulcer which was oversewn and 3.1 L of contaminated peritoneal fluid.   Extubated on 11/29 Hospital course complicated by DKA, AKI, acute blood loss anemia and acute thrombocytopenia.  Transferred out of ICU to [facility] today. General surgery following   Subjective: Sleepy this AM. No complaints.     Assessment & Plan:   Principal Problem:   Septic shock due to ruptured gastric ulcer with acute peritonitis, fungemia and UTI - ID consulted on 02/04/19 - Blood cultures from 11/22> Candida Glabrata- repeat blood culture 11/24 NGTD - Urine culture 11/22 > K pneumoniae - Peritoneal fluid 11/23> Candida albicans - Opthalmology consulted - no evidence of fungal endophthalmitis  - currently receiving Andulafungin and Zosyn - General surgery managing abdominal issues- NG tube to suction - still has quite a bit of output and thus she remains NPO - ID has recommended to remove cental line to give line holiday- this will be done today    Active Problems:   DKA (diabetic ketoacidoses), severely uncontrolled with hyperglycemia  - resolved - A1c on 11/23>  15.2 - Uses Lantus and Novolog as outpt- currently on sliding scale insulin and NPO  Hypokalemia - replacing    Acute kidney injury  - baseline  Cr is < 1.0 - Cr rose to 2.66 on 11/26 and is steadily improving - foley cath removed on 11/30  Anemia due to acute blood loss - s/p 1 U PRBC on 11/28 for Hb of 6.1 - remains slightly anemia with Hb ~ 7 -  anemia panel shows low normal Iron stores, Normal folate and elevated B12  Acute thrombocytopenia - related to septic shock - Platelets now rising- nadir was > 31 on 11/27  Severe protein calorie malnutrition with hypoalbuminemia - NPO at this time- NG tube is still draining-   Blebs and mottling on distal extremities - noted to have dusky skin with large blebs (not fluid filled) on stumps and hands- one has ruptured and local wound care is being performed - I am not sure how long these have been present but I noted them on the day she was transferred to our service- they do not appear to be progressing - extremities are warm and appear to be perfusing well  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 - not on medications for this  Left phalanx amputation  - on 12/28/18- Hand surgery has been contacted by general surgery to request suture removal  B/ l BKA   Time spent in minutes: 30 min  DVT prophylaxis: heparin Code Status: Full code Family Communication:  Disposition Plan: cont to follow in SDU- her condition remains tenuous  Consultants:   General surgery  ID Procedures:   Intubation/ Extubation  Ex lap Antimicrobials:  Anti-infectives (From admission, onward)   Start  Dose/Rate Route Frequency Ordered Stop   02/05/19 1000  anidulafungin (ERAXIS) 100 mg in sodium chloride 0.9 % 100 mL IVPB     100 mg 78 mL/hr over 100 Minutes Intravenous Every 24 hours 02/04/19 0840     02/04/19 1000  anidulafungin (ERAXIS) 200 mg in sodium chloride 0.9 % 200 mL IVPB     200 mg 78 mL/hr over 200 Minutes Intravenous  Once 02/04/19 0840 02/04/19 1301   02/04/19 0200  fluconazole (DIFLUCAN) IVPB 200 mg  Status:  Discontinued     200 mg 100 mL/hr over  60 Minutes Intravenous Every 24 hours 02/02/19 2302 02/04/19 0840   02/03/19 1800  vancomycin (VANCOCIN) IVPB 750 mg/150 ml premix  Status:  Discontinued     750 mg 150 mL/hr over 60 Minutes Intravenous Every 24 hours 02/03/19 0241 02/03/19 0242   02/03/19 1800  vancomycin (VANCOCIN) IVPB 750 mg/150 ml premix  Status:  Discontinued     750 mg 150 mL/hr over 60 Minutes Intravenous Every 24 hours 02/03/19 0242 02/03/19 0858   02/03/19 0600  piperacillin-tazobactam (ZOSYN) IVPB 3.375 g     3.375 g 12.5 mL/hr over 240 Minutes Intravenous Every 8 hours 02/03/19 0243     02/02/19 2315  fluconazole (DIFLUCAN) IVPB 400 mg     400 mg 100 mL/hr over 120 Minutes Intravenous STAT 02/02/19 2301 02/03/19 0218   02/02/19 1800  ceFEPIme (MAXIPIME) 2 g in sodium chloride 0.9 % 100 mL IVPB     2 g 200 mL/hr over 30 Minutes Intravenous  Once 02/02/19 1745 02/02/19 2030   02/02/19 1800  metroNIDAZOLE (FLAGYL) IVPB 500 mg     500 mg 100 mL/hr over 60 Minutes Intravenous  Once 02/02/19 1745 02/02/19 2237   02/02/19 1800  vancomycin (VANCOCIN) IVPB 1000 mg/200 mL premix  Status:  Discontinued     1,000 mg 200 mL/hr over 60 Minutes Intravenous  Once 02/02/19 1745 02/02/19 1747   02/02/19 1800  vancomycin (VANCOCIN) 1,250 mg in sodium chloride 0.9 % 250 mL IVPB     1,250 mg 166.7 mL/hr over 90 Minutes Intravenous  Once 02/02/19 1747 02/02/19 2237       Objective: Vitals:   02/11/19 0600 02/11/19 0700 02/11/19 0800 02/11/19 0900  BP: (!) 116/51 (!) 101/54 (!) 108/57 133/62  Pulse: 91 93 90 95  Resp: (!) 22 (!) 21 (!) 22 (!) 21  Temp:   97.9 F (36.6 C)   TempSrc:   Oral   SpO2:   100%   Weight:      Height:        Intake/Output Summary (Last 24 hours) at 02/11/2019 1003 Last data filed at 02/11/2019 0919 Gross per 24 hour  Intake 2126.83 ml  Output 2095 ml  Net 31.83 ml   Filed Weights   02/09/19 2015 02/10/19 0500 02/11/19 0441  Weight: 63.8 kg 63.2 kg 63.7 kg    Examination: General  exam: Appears comfortable  HEENT: PERRLA, oral mucosa moist, no sclera icterus or thrush Respiratory system: Clear to auscultation. Respiratory effort normal. Cardiovascular system: S1 & S2 heard,  No murmurs  Gastrointestinal system: Abdomen soft, NG tube present- poor bowel sounds Central nervous system: Alert and oriented. No focal neurological deficits. Extremities: No cyanosis, clubbing or edema Skin:         Data Reviewed: I have personally reviewed following labs and imaging studies  CBC: Recent Labs  Lab 02/07/19 0348 02/08/19 0409 02/08/19 1407 02/09/19 0528 02/10/19 0351 02/11/19 0431  WBC 3.3* 4.5  --  6.2 5.9 6.6  NEUTROABS  --   --   --   --  4.3  --   HGB 7.0* 6.1* 7.5* 7.6* 7.3* 7.2*  HCT 20.2* 18.7* 23.0* 23.1* 22.8* 22.8*  MCV 87.1 89.5  --  88.2 89.8 91.6  PLT 31* 58*  --  78* 110* 982   Basic Metabolic Panel: Recent Labs  Lab 02/05/19 0230  02/06/19 0147  02/07/19 0348 02/07/19 1609 02/08/19 0409 02/09/19 0528 02/09/19 1627 02/10/19 0351 02/11/19 0431 02/11/19 0644  NA  --    < > 128*   < > 128* 129* 129* 133*  --  136 136  --   K  --    < > 3.0*   < > 3.0* 3.3* 3.1* 3.2* 3.7 3.1* 3.4*  --   CL  --    < > 86*   < > 86* 89* 89* 96*  --  99 103  --   CO2  --    < > 25   < > 27 25 26 26   --  25 24  --   GLUCOSE  --    < > 105*   < > 72 169* 234* 251*  --  143* 199*  --   BUN  --    < > 53*   < > 50* 51* 46* 41*  --  36* 33*  --   CREATININE  --    < > 2.66*   < > 2.52* 2.49* 2.38* 2.01*  --  1.77* 1.50*  --   CALCIUM  --    < > 6.4*   < > 7.0* 6.9* 7.3* 7.2*  --  7.6* 7.5*  --   MG 1.4*  --  2.0   < > 1.9  --  2.3 2.1  --  2.1 2.0  --   PHOS 5.6*  --  5.0*  --   --   --   --   --   --  3.2  --  3.0   < > = values in this interval not displayed.   GFR: Estimated Creatinine Clearance: 36.3 mL/min (A) (by C-G formula based on SCr of 1.5 mg/dL (H)). Liver Function Tests: Recent Labs  Lab 02/10/19 0351  AST 15  ALT 19  ALKPHOS 57  BILITOT  0.6  PROT 4.1*  ALBUMIN 1.2*   No results for input(s): LIPASE, AMYLASE in the last 168 hours. No results for input(s): AMMONIA in the last 168 hours. Coagulation Profile: No results for input(s): INR, PROTIME in the last 168 hours. Cardiac Enzymes: No results for input(s): CKTOTAL, CKMB, CKMBINDEX, TROPONINI in the last 168 hours. BNP (last 3 results) No results for input(s): PROBNP in the last 8760 hours. HbA1C: No results for input(s): HGBA1C in the last 72 hours. CBG: Recent Labs  Lab 02/10/19 1211 02/10/19 1542 02/10/19 1939 02/10/19 2343 02/11/19 0317  GLUCAP 172* 137* 234* 202* 186*   Lipid Profile: Recent Labs    02/10/19 0351  TRIG 134   Thyroid Function Tests: No results for input(s): TSH, T4TOTAL, FREET4, T3FREE, THYROIDAB in the last 72 hours. Anemia Panel: Recent Labs    02/10/19 0830 02/11/19 0431  VITAMINB12 5,528*  --   FOLATE  --  10.2  FERRITIN  --  304  TIBC  --  106*  IRON  --  14*  RETICCTPCT  --  1.2   Urine analysis:    Component Value  Date/Time   COLORURINE AMBER (A) 02/02/2019 1744   APPEARANCEUR CLOUDY (A) 02/02/2019 1744   LABSPEC 1.023 02/02/2019 1744   PHURINE 5.0 02/02/2019 1744   GLUCOSEU >=500 (A) 02/02/2019 1744   HGBUR MODERATE (A) 02/02/2019 [place] 02/02/2019 1744   BILIRUBINUR N 04/28/2016 1603   KETONESUR 5 (A) 02/02/2019 1744   PROTEINUR 100 (A) 02/02/2019 1744   UROBILINOGEN 0.2 04/28/2016 1603   UROBILINOGEN 0.2 06/18/2013 0650   NITRITE NEGATIVE 02/02/2019 1744   LEUKOCYTESUR LARGE (A) 02/02/2019 1744   Sepsis Labs: @LABRCNTIP (procalcitonin:4,lacticidven:4) ) Recent Results (from the past 240 hour(s))  Antifungal AST 9 Drug Panel     Status: None   Collection Time: 02/02/19 12:00 AM  Result Value Ref Range Status   Organism ID, Yeast Candida glabrata  Corrected    Comment: (NOTE) Identification performed by account, not confirmed by this laboratory. CORRECTED ON 11/29 AT 1435:  PREVIOUSLY REPORTED AS Preliminary report Specimen has been received and testing has been initiated.    Amphotericin B MIC 1.0 ug/mL  Final   Please Note: Comment  Final    Comment: (NOTE) CLSI does not have established guidelines for interpretation of these organism-drug combinations. For research use only.    Anidulafungin MIC Comment  Final    Comment: 0.12 ug/mL Susceptible   Caspofungin MIC Comment  Final    Comment: 0.12 ug/mL Susceptible   Micafungin MIC Comment  Final    Comment: 0.03 ug/mL Susceptible   Posaconazole MIC 2.0 ug/mL  Final   Please Note: Comment  Final    Comment: (NOTE) Results for this test are for research purposes only by the assay's manufacturer.  The performance characteristics of this product have not been established.  Results should not be used as a diagnostic procedure without confirmation of the diagnosis by another medically established diagnostic product or procedure.    Fluconazole Islt MIC 16.0 ug/mL  Final    Comment: Susceptible Dose Dependent   Flucytosine MIC 0.06 ug/mL or less  Final   Itraconazole MIC 1.0 ug/mL  Final   Voriconazole MIC 0.5 ug/mL  Final    Comment: (NOTE) Performed At: [facility] [place], [place] [id number] Rush Farmer MD QB:[id number]    Source BLOOD/ CANDIDA GLABRATA  Final    Comment: Performed at [facility], [place] [address], [place], [place] [address]  Urine culture     Status: Abnormal   Collection Time: 02/02/19  5:44 PM   Specimen: In/Out Cath Urine  Result Value Ref Range Status   Specimen Description   Final    IN/OUT CATH URINE Performed at [facility] [address] [place], [place] [address]    Special Requests   Final    NONE Performed at [facility], [place] [address], [place], [place] [address]    Culture >=100,000 COLONIES/mL KLEBSIELLA PNEUMONIAE (A)  Final   Report Status 02/04/2019 FINAL  Final   Organism ID,  Bacteria KLEBSIELLA PNEUMONIAE (A)  Final      Susceptibility   Klebsiella pneumoniae - MIC*    AMPICILLIN >=32 RESISTANT Resistant     CEFAZOLIN <=4 SENSITIVE Sensitive     CEFTRIAXONE <=1 SENSITIVE Sensitive     CIPROFLOXACIN <=0.25 SENSITIVE Sensitive     GENTAMICIN <=1 SENSITIVE Sensitive     IMIPENEM <=0.25 SENSITIVE Sensitive     NITROFURANTOIN 64 INTERMEDIATE Intermediate     TRIMETH/SULFA <=20 SENSITIVE Sensitive     AMPICILLIN/SULBACTAM  4 SENSITIVE Sensitive     PIP/TAZO <=4 SENSITIVE Sensitive     Extended ESBL NEGATIVE Sensitive     * >=100,000 COLONIES/mL KLEBSIELLA PNEUMONIAE  Blood Culture (routine x 2)     Status: Abnormal (Preliminary result)   Collection Time: 02/02/19  6:16 PM   Specimen: BLOOD  Result Value Ref Range Status   Specimen Description   Final    BLOOD LEFT ANTECUBITAL Performed at [facility] [address] [place], [place] [address]    Special Requests   Final    BOTTLES DRAWN AEROBIC AND ANAEROBIC Blood Culture adequate volume Performed at [facility] [address] [place], [place] [address]    Culture  Setup Time (A)  Final    YEAST ANAEROBIC BOTTLE ONLY CRITICAL RESULT CALLED TO, READ BACK BY AND VERIFIED WITH: Christean Grief PHARMD, AT 0981 02/04/19 BY D. VANHOOK    Culture (A)  Final    CANDIDA GLABRATA Sent to [facility] for further susceptibility testing. Performed at [facility], [place] [address], [place], [place] [address]    Report Status PENDING  Incomplete  Blood Culture ID Panel (Reflexed)     Status: Abnormal   Collection Time: 02/02/19  6:16 PM  Result Value Ref Range Status   Enterococcus species NOT DETECTED NOT DETECTED Final   Listeria monocytogenes NOT DETECTED NOT DETECTED Final   Staphylococcus species NOT DETECTED NOT DETECTED Final   Staphylococcus aureus (BCID) NOT DETECTED NOT DETECTED Final   Streptococcus species NOT DETECTED NOT DETECTED Final   Streptococcus agalactiae  NOT DETECTED NOT DETECTED Final   Streptococcus pneumoniae NOT DETECTED NOT DETECTED Final   Streptococcus pyogenes NOT DETECTED NOT DETECTED Final   Acinetobacter baumannii NOT DETECTED NOT DETECTED Final   Enterobacteriaceae species NOT DETECTED NOT DETECTED Final   Enterobacter cloacae complex NOT DETECTED NOT DETECTED Final   Escherichia coli NOT DETECTED NOT DETECTED Final   Klebsiella oxytoca NOT DETECTED NOT DETECTED Final   Klebsiella pneumoniae NOT DETECTED NOT DETECTED Final   Proteus species NOT DETECTED NOT DETECTED Final   Serratia marcescens NOT DETECTED NOT DETECTED Final   Haemophilus influenzae NOT DETECTED NOT DETECTED Final   Neisseria meningitidis NOT DETECTED NOT DETECTED Final   Pseudomonas aeruginosa NOT DETECTED NOT DETECTED Final   Candida albicans NOT DETECTED NOT DETECTED Final   Candida glabrata DETECTED (A) NOT DETECTED Final    Comment: CRITICAL RESULT CALLED TO, READ BACK BY AND VERIFIED WITH: J. LEGGE PHARMD, AT 8295 02/04/19 BY D. VANHOOK    Candida krusei NOT DETECTED NOT DETECTED Final   Candida parapsilosis NOT DETECTED NOT DETECTED Final   Candida tropicalis NOT DETECTED NOT DETECTED Final    Comment: Performed at [facility], [address], [place], [place] [address]  SARS Coronavirus 2 by RT PCR (hospital order, performed in [facility] hospital lab) Nasopharyngeal Nasopharyngeal Swab     Status: None   Collection Time: 02/02/19  9:13 PM   Specimen: Nasopharyngeal Swab  Result Value Ref Range Status   SARS Coronavirus 2 NEGATIVE NEGATIVE Final    Comment: (NOTE) If result is NEGATIVE SARS-CoV-2 target nucleic acids are NOT DETECTED. The SARS-CoV-2 RNA is generally detectable in upper and lower  respiratory specimens during the acute phase of infection. The lowest  concentration of SARS-CoV-2 viral copies this assay can detect is 250  copies / mL. A negative result does not preclude SARS-CoV-2 infection  and should not be used as  the  sole basis for treatment or other  patient management decisions.  A negative result may occur with  improper specimen collection / handling, submission of specimen other  than nasopharyngeal swab, presence of viral mutation(s) within the  areas targeted by this assay, and inadequate number of viral copies  (<250 copies / mL). A negative result must be combined with clinical  observations, patient history, and epidemiological information. If result is POSITIVE SARS-CoV-2 target nucleic acids are DETECTED. The SARS-CoV-2 RNA is generally detectable in upper and lower  respiratory specimens dur ing the acute phase of infection.  Positive  results are indicative of active infection with SARS-CoV-2.  Clinical  correlation with patient history and other diagnostic information is  necessary to determine patient infection status.  Positive results do  not rule out bacterial infection or co-infection with other viruses. If result is PRESUMPTIVE POSTIVE SARS-CoV-2 nucleic acids MAY BE PRESENT.   A presumptive positive result was obtained on the submitted specimen  and confirmed on repeat testing.  While 2019 novel coronavirus  (SARS-CoV-2) nucleic acids may be present in the submitted sample  additional confirmatory testing may be necessary for epidemiological  and / or clinical management purposes  to differentiate between  SARS-CoV-2 and other Sarbecovirus currently known to infect humans.  If clinically indicated additional testing with an alternate test  methodology [phone]) is advised. The SARS-CoV-2 RNA is generally  detectable in upper and lower respiratory sp ecimens during the acute  phase of infection. The expected result is Negative. Fact Sheet for Patients:  StrictlyIdeas.no Fact Sheet for Healthcare Providers: BankingDealers.co.za This test is not yet approved or cleared by the [place] FDA and has been authorized for detection  and/or diagnosis of SARS-CoV-2 by FDA under an Emergency Use Authorization (EUA).  This EUA will remain in effect (meaning this test can be used) for the duration of the COVID-19 declaration under Section 564(b)(1) of the Act, 21 U.S.C. section 360bbb-3(b)(1), unless the authorization is terminated or revoked sooner. Performed at [facility], [place] [address] [place], [place] [address]   Aerobic/Anaerobic Culture (surgical/deep wound)     Status: None   Collection Time: 02/03/19  2:25 AM   Specimen: Peritoneal; Body Fluid  Result Value Ref Range Status   Specimen Description   Final    PERITONEAL Performed at [facility] [address] [place], [place] [address]    Special Requests   Final    NONE Performed at [facility], [place] [address] [place], [place] [address]    Gram Stain NO WBC SEEN RARE BUDDING YEAST SEEN   Final   Culture   Final    ABUNDANT CANDIDA ALBICANS MODERATE CANDIDA GLABRATA NO ANAEROBES ISOLATED Performed at [facility], [address] [place], [place] [address]    Report Status 02/08/2019 FINAL  Final  MRSA PCR Screening     Status: None   Collection Time: 02/03/19  2:53 AM   Specimen: Nasopharyngeal  Result Value Ref Range Status   MRSA by PCR NEGATIVE NEGATIVE Final    Comment:        The GeneXpert MRSA Assay (FDA approved for NASAL specimens only), is one component of a comprehensive MRSA colonization surveillance program. It is not intended to diagnose MRSA infection nor to guide or monitor treatment for MRSA infections. Performed at [facility], [place] [address], [place], [place] [address]   Culture, blood (routine x 2)  Status: None   Collection Time: 02/04/19 11:02 AM   Specimen: BLOOD RIGHT HAND  Result Value Ref Range Status   Specimen Description   Final    BLOOD RIGHT HAND Performed at [facility], [place] [address], [place], [place]  [address]    Special Requests   Final    BOTTLES DRAWN AEROBIC ONLY Blood Culture results may not be optimal due to an inadequate volume of blood received in culture bottles Performed at [facility] [address] [place], [place] [address]    Culture   Final    NO GROWTH 5 DAYS Performed at [facility], [place] [address] [place], [place] [address]    Report Status 02/09/2019 FINAL  Final  Culture, blood (routine x 2)     Status: None   Collection Time: 02/04/19 11:25 AM   Specimen: BLOOD  Result Value Ref Range Status   Specimen Description   Final    BLOOD RIGHT ARM Performed at [facility] [address], [place], [place] [address]    Special Requests   Final    BOTTLES DRAWN AEROBIC AND ANAEROBIC Blood Culture results may not be optimal due to an inadequate volume of blood received in culture bottles Performed at [facility] [address], [place], [place] [address]    Culture   Final    NO GROWTH 5 DAYS Performed at [facility], [place] [address], [place], [place] [address]    Report Status 02/09/2019 FINAL  Final  Culture, blood (routine x 2)     Status: None (Preliminary result)   Collection Time: 02/08/19 12:13 PM   Specimen: BLOOD  Result Value Ref Range Status   Specimen Description   Final    BLOOD RIGHT WRIST Performed at [facility] [address] [place], [place] [address]    Special Requests   Final    BOTTLES DRAWN AEROBIC AND ANAEROBIC Blood Culture adequate volume Performed at [facility] [address], [place], [place] [address]    Culture   Final    NO GROWTH 3 DAYS Performed at [facility], [place] [address] [place], [place] [address]    Report Status PENDING  Incomplete  Culture, blood (routine x 2)     Status: None (Preliminary result)   Collection Time: 02/08/19 12:28 PM   Specimen: BLOOD  Result Value Ref Range Status    Specimen Description   Final    BLOOD RIGHT ARM Performed at [facility] [address] [place], [place] [address]    Special Requests   Final    BOTTLES DRAWN AEROBIC ONLY Blood Culture results may not be optimal due to an inadequate volume of blood received in culture bottles Performed at [facility] [address] [place], [place] [address]    Culture   Final    NO GROWTH 3 DAYS Performed at [facility], [place] [address] [place],  [address]    Report Status PENDING  Incomplete         Radiology Studies: Ct Abdomen Wo Contrast  Result Date: 02/10/2019 CLINICAL DATA:  Eval for perforation. EXAM: CT ABDOMEN WITHOUT CONTRAST TECHNIQUE: Multidetector CT imaging of the abdomen was performed following the standard protocol without IV contrast. COMPARISON:  Upper GI evaluation of the same date and CT abdomen and pelvis of 02/02/2019. FINDINGS: Lower chest: Basilar collapse/consolidation with small  bilateral pleural effusions. No signs of pericardial effusion Hepatobiliary: Loculated appearing fluid along the a [place] margin measures approximately 7 by 2.8 cm scalloping the liver margin. Limited assessment a left hepatic lobe due to extreme streak artifact from recently administered gastrointestinal contrast. Signs of cholelithiasis. Pancreas: Not well evaluated. No gross abnormality within the pancreatic bed. Spleen: Not well evaluated due to streak artifact. Adrenals/Urinary Tract: Normal adrenal glands. No signs of hydronephrosis. Stomach/Bowel: Dense contrast material administered during an upper GI procedure. Irregularity of the antral pyloric stomach compatible likely related to repair of gastric ulcer, surgical drain terminates anterior to the antro pyloric stomach and exits via right sub hepatic abdominal wall. There is a question of very subtle added density within the lumen of the drainage catheter. The density of material  surrounding the stomach does not show definitive evidence of a leak however. Mixing of contrast, the small amount of contrast that did pass the level of the ulcer repair is noted within the lumen of distended small bowel loops becoming more dilute distally. There are numerous distended small bowel loops. Since the previous study there has been resolution of the diffuse enteric pneumatosis. Small amount of fluid is seen along the anterior margin of the abdomen just deep to the abdominal wall, anterior to dilated loops, measuring approximately 8 mm in greatest thickness deep to the left rectus muscle (image 43, series 2. Vascular/Lymphatic: Calcified atherosclerotic changes throughout the abdominal aorta. No signs of adenopathy. Other: Perihepatic fluid with loculated appearance. Dilated loops of bowel described above, associated with mesenteric edema and a small amount of peritoneal fluid. Musculoskeletal: No signs of acute bone finding or evidence of destructive bone process. IMPRESSION: 1. No signs of gross leak; however, there is a question of very subtle added density within the lumen of the drain, raising the question of a tiny leak that cannot be confirmed by the presence of a collection of extraluminal contrast. Suggest close follow-up perhaps with repeat CT utilizing dilute water-soluble contrast administered via existing gastric tube after resolution of pyloric edema that is seen on today's study. This presumed edema restricts flow of the contrast despite oblique positioning of the patient during the fluoroscopic exam. 2. Interval resolution of the diffuse enteric pneumatosis now with diffuse presumed ileus. Continued attention on follow-up is suggested. 3. Postoperative perihepatic fluid collection with lenticular contour compatible with loculated fluid. 4. Small amount of fluid along the anterior margin of the abdomen just deep to the left rectus muscle, measuring approximately 8 mm in greatest thickness.  5. Basilar collapse/consolidation with small bilateral pleural effusions. 6. These results will be called to the ordering clinician or representative by the Radiologist Assistant, and communication documented in the PACS or zVision Dashboard. Aortic Atherosclerosis (ICD10-I70.0). Electronically Signed   By: Zetta Bills M.D.   On: 02/10/2019 17:31   Dg Abd 1 View  Result Date: 02/09/2019 CLINICAL DATA:  NG tube placement EXAM: ABDOMEN - 1 VIEW COMPARISON:  None. FINDINGS: Minimal bibasilar atelectasis. Enteric tube tip and side-port project over the stomach. Gaseous distended loops of small bowel within the central abdomen. IMPRESSION: Enteric tube tip and side-port project over the stomach. Electronically Signed   By: Lovey Newcomer M.D.   On: 02/09/2019 14:50   Dg Duanne Limerick W Single Cm (sol Or Thin Ba)  Result Date: 02/10/2019 CLINICAL DATA:  History of perforated gastric ulcer. EXAM: WATER SOLUBLE UPPER GI SERIES TECHNIQUE: Single-column upper GI series was performed using water soluble contrast. CONTRAST:  100 cc Omnipaque administered  per existing G-tube period. COMPARISON:  CT abdomen and pelvis from 02/02/2019. FLUOROSCOPY TIME:  Fluoroscopy Time:  2 minutes 18 seconds Radiation Exposure Index (if provided by the fluoroscopic device): 20.4 mGy Number of Acquired Spot Images: 4 FINDINGS: Scout image obtained shows a G-tube coiled in the stomach and a surgical drain entering the image from the right likely terminating in the area around the repair site. Bowel distension in the upper abdomen is noted, small bowel loops reaching caliber near the 2-1/2 to 3 cm. Slow injection of iodinated contrast material pooled in the stomach, confined mainly to the fundus and body of stomach despite attempts at oblique positioning. This was complicated by the patient's debilitated state and recent surgery. Limited contrast passed through the pyloric channel. Irregularity of this channel likely relates to the repair and 2  significant edema in the area. IMPRESSION: 1. Presumed bowel edema following repair, limiting flow of contrast from the stomach into the small bowel, for this reason the exam the subsequent CT or limited with respect to detection of small leaks. There is no gross leak on the images which were obtained for today's study. 2. The patient was taken to CT following this procedure in an attempt to localize smaller amounts of I had and 80 contrast which might be present but not detectable by fluoroscopy. Please see dedicated report for further detail. 3. Signs of presumed ileus.  Suggest attention on follow-up. Electronically Signed   By: Zetta Bills M.D.   On: 02/10/2019 17:36      Scheduled Meds:  sodium chloride   Intravenous Once   chlorhexidine gluconate (MEDLINE KIT)  15 mL Mouth Rinse BID   Chlorhexidine Gluconate Cloth  6 each Topical Daily   Gerhardt's butt cream   Topical TID   heparin injection (subcutaneous)  5,000 Units Subcutaneous Q8H   insulin aspart  0-9 Units Subcutaneous Q4H   pantoprazole (PROTONIX) IV  40 mg Intravenous Q12H   sodium chloride flush  10-40 mL Intracatheter Q12H   Continuous Infusions:  sodium chloride     0.9 % NaCl with KCl 20 mEq / L Stopped (02/11/19 0600)   anidulafungin Stopped (02/10/19 1201)   piperacillin-tazobactam (ZOSYN)  IV 12.5 mL/hr at 02/11/19 0919   potassium chloride 10 mEq (02/11/19 0919)   TPN ADULT (ION) 40 mL/hr at 02/11/19 0919     LOS: 9 days      [name], MD [facility] Pager: [email or web address] Password [email or web address] 02/11/2019, 10:03 AM

## 2019-02-12 ENCOUNTER — Encounter (HOSPITAL_COMMUNITY): Payer: Self-pay | Admitting: Radiology

## 2019-02-12 ENCOUNTER — Inpatient Hospital Stay (HOSPITAL_COMMUNITY): Payer: Medicare HMO

## 2019-02-12 LAB — GLUCOSE, CAPILLARY
Glucose-Capillary: 148 mg/dL — ABNORMAL HIGH (ref 70–99)
Glucose-Capillary: 274 mg/dL — ABNORMAL HIGH (ref 70–99)
Glucose-Capillary: 195 mg/dL — ABNORMAL HIGH (ref 70–99)
Glucose-Capillary: 223 mg/dL — ABNORMAL HIGH (ref 70–99)
Glucose-Capillary: 146 mg/dL — ABNORMAL HIGH (ref 70–99)
Glucose-Capillary: 96 mg/dL (ref 70–99)

## 2019-02-12 LAB — CULTURE, BLOOD (ROUTINE X 2): Special Requests: ADEQUATE

## 2019-02-12 LAB — URINALYSIS, ROUTINE W REFLEX MICROSCOPIC
Bilirubin Urine: NEGATIVE
Glucose, UA: >=500 mg/dL — AB
Ketones, ur: NEGATIVE mg/dL
Leukocytes,Ua: NEGATIVE
Nitrite: NEGATIVE
Protein, ur: 30 mg/dL — AB
Specific Gravity, Urine: 1.01 (ref 1.005–1.030)
pH: 5 (ref 5.0–8.0)

## 2019-02-12 LAB — BASIC METABOLIC PANEL
Anion gap: 11 (ref 5–15)
BUN: 31 mg/dL — ABNORMAL HIGH (ref 6–20)
CO2: 21 mmol/L — ABNORMAL LOW (ref 22–32)
Calcium: 7.7 mg/dL — ABNORMAL LOW (ref 8.9–10.3)
Chloride: 106 mmol/L (ref 98–111)
Creatinine, Ser: 1.56 mg/dL — ABNORMAL HIGH (ref 0.44–1.00)
GFR calc Af Amer: 42 mL/min — ABNORMAL LOW (ref >60)
GFR calc non Af Amer: 37 mL/min — ABNORMAL LOW (ref >60)
Glucose, Bld: 276 mg/dL — ABNORMAL HIGH (ref 70–99)
Potassium: 3.8 mmol/L (ref 3.5–5.1)
Sodium: 138 mmol/L (ref 135–145)

## 2019-02-12 LAB — VITAMIN B12: Vitamin B-12: >7500 pg/mL — ABNORMAL HIGH (ref 180–914)

## 2019-02-12 LAB — AMMONIA: Ammonia: <9 umol/L — ABNORMAL LOW (ref 9–35)

## 2019-02-12 NOTE — Consult Note (Addendum)
[facility] Nurse Consult Note: Reason for Consult: stump wounds Spoke with bedside nurse who is also a Wound Treatment Associate prior to wound consult  Reaction to medication with blistering but not systemic. 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  Bedside nurses to re-consult if wound bed has acute changes.   Discussed POC with bedside nurse.  Re consult if needed, will not follow at this time. Thanks  Destyne Goodreau [employer], RN,CWOCN, CNS, [place] [phone])

## 2019-02-12 NOTE — Progress Notes (Addendum)
PROGRESS NOTE    Gina [name]   IRW:[id number]  DOB: [birth year]  DOA: 02/02/2019 PCP: [name], MD   Brief Narrative:  Gina [name] is a 58 y.o. 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 In the ED, she was hypothermic and severely hypotensive. Initial blood glucose >1000.   CT abdomen showed pneumoperitoneum and mesenteric ischemia. Admitted to ICU service and started on IV antibiotics.   11/23 > She underwent exploratory Laparotomy with finding of perforated prepyloric ulcer which was oversewn and 3.1 L of contaminated peritoneal fluid.   Extubated on 11/29 Hospital course complicated by DKA, AKI, acute blood loss anemia and acute thrombocytopenia.  Transferred out of ICU to [facility] today. General surgery following   Subjective: Appears anxious and confused today.    Assessment & Plan:   Principal Problem:   Septic shock due to ruptured gastric ulcer with acute peritonitis, fungemia and UTI - ID consulted on 02/04/19 - Blood cultures from 11/22> Candida Glabrata- repeat blood culture 11/24 NGTD - Urine culture 11/22 > K pneumoniae - Peritoneal fluid 11/23> Candida albicans - Opthalmology consulted - no evidence of fungal endophthalmitis  - currently receiving Andulafungin and Zosyn - General surgery managing abdominal issues- NG tube to suction - still has quite a bit of output and thus she remains NPO- she is noted to have stool in her flexiseal today - ID has recommended to remove cental line to give line holiday and right IJ was removed on 12/1 - TPN on hold due to line holiday- on D10 infusion in the meantime    Active Problems:   DKA (diabetic ketoacidoses), severely uncontrolled with hyperglycemia  - resolved - A1c on 11/23>  15.2 - Uses Lantus  and Novolog as outpt- currently on sliding scale insulin and NPO  Hypokalemia - replacing    Acute kidney injury  - baseline Cr is < 1.0 - Cr rose to 2.66 on 11/26 and is steadily improving - foley cath removed on 11/30  Anemia due to acute blood loss - s/p 1 U PRBC on 11/28 for Hb of 6.1 - remains slightly anemic with Hb ~ 7 -  anemia panel shows low normal Iron stores, Normal folate and elevated B12 - transfused for Hb persistently < 7  Acute thrombocytopenia - related to septic shock - Platelets now rising- nadir was > 31 on 11/27  Severe protein calorie malnutrition with hypoalbuminemia - NPO at this time- NG tube is still draining-   Blebs and mottling on distal extremities - noted to have dusky skin with large blebs (not fluid filled) on stumps and hands- one has ruptured and local wound care is being performed - I am not sure how long these have been present but I noted them on the day she was transferred to our service-- per RN who was taking care of her during the time they developed, it occurred after Levophed was started (? If she had ischemia from vasoconstriction) - extremities are warm and appear to be perfusing well - they do not appear to be progressing and actually are resolving but skin breakdown is occurring- local wound care being done- Wound care RN also consulted to give input   Elevated HCV antibody - 11/25 - HCV > 11.0  Anxiety Acute delirium - her only psych medication was  Lexapro which has been on hold as she has been NPO - acute confusion, hallucinations, paranoia anxiety noted   -will ask for a psych eval - CT head ordered, Check UA for underlying infection- check B12 and Ammonia.   Left phalanx amputation  - on 12/28/18- Hand surgery contacted by general surgery on 11/30 to request suture removal  B/ l BKA   Time spent in minutes: 30 min  DVT prophylaxis: heparin Code Status: Full code Family Communication:  Disposition Plan: cont to  follow in SDU- her condition remains tenuous  Consultants:   General surgery  ID Procedures:   Intubation/ Extubation  Ex lap Antimicrobials:  Anti-infectives (From admission, onward)   Start     Dose/Rate Route Frequency Ordered Stop   02/05/19 1000  anidulafungin (ERAXIS) 100 mg in sodium chloride 0.9 % 100 mL IVPB     100 mg 78 mL/hr over 100 Minutes Intravenous Every 24 hours 02/04/19 0840     02/04/19 1000  anidulafungin (ERAXIS) 200 mg in sodium chloride 0.9 % 200 mL IVPB     200 mg 78 mL/hr over 200 Minutes Intravenous  Once 02/04/19 0840 02/04/19 1301   02/04/19 0200  fluconazole (DIFLUCAN) IVPB 200 mg  Status:  Discontinued     200 mg 100 mL/hr over 60 Minutes Intravenous Every 24 hours 02/02/19 2302 02/04/19 0840   02/03/19 1800  vancomycin (VANCOCIN) IVPB 750 mg/150 ml premix  Status:  Discontinued     750 mg 150 mL/hr over 60 Minutes Intravenous Every 24 hours 02/03/19 0241 02/03/19 0242   02/03/19 1800  vancomycin (VANCOCIN) IVPB 750 mg/150 ml premix  Status:  Discontinued     750 mg 150 mL/hr over 60 Minutes Intravenous Every 24 hours 02/03/19 0242 02/03/19 0858   02/03/19 0600  piperacillin-tazobactam (ZOSYN) IVPB 3.375 g     3.375 g 12.5 mL/hr over 240 Minutes Intravenous Every 8 hours 02/03/19 0243     02/02/19 2315  fluconazole (DIFLUCAN) IVPB 400 mg     400 mg 100 mL/hr over 120 Minutes Intravenous STAT 02/02/19 2301 02/03/19 0218   02/02/19 1800  ceFEPIme (MAXIPIME) 2 g in sodium chloride 0.9 % 100 mL IVPB     2 g 200 mL/hr over 30 Minutes Intravenous  Once 02/02/19 1745 02/02/19 2030   02/02/19 1800  metroNIDAZOLE (FLAGYL) IVPB 500 mg     500 mg 100 mL/hr over 60 Minutes Intravenous  Once 02/02/19 1745 02/02/19 2237   02/02/19 1800  vancomycin (VANCOCIN) IVPB 1000 mg/200 mL premix  Status:  Discontinued     1,000 mg 200 mL/hr over 60 Minutes Intravenous  Once 02/02/19 1745 02/02/19 1747   02/02/19 1800  vancomycin (VANCOCIN) 1,250 mg in sodium chloride  0.9 % 250 mL IVPB     1,250 mg 166.7 mL/hr over 90 Minutes Intravenous  Once 02/02/19 1747 02/02/19 2237       Objective: Vitals:   02/12/19 0600 02/12/19 0700 02/12/19 0800 02/12/19 0900  BP: (!) 124/46 (!) 125/46 (!) 156/68 139/65  Pulse: (!) 103 (!) 102 (!) 103 99  Resp: 20 (!) _0 Temp:      TempSrc:      SpO2:      Weight:      Height:        Intake/Output Summary (Last 24 hours) at 02/12/2019 1021 Last data filed at 02/12/2019 0631 Gross per 24 hour  Intake 2011.11 ml  Output 3025 ml  Net -1013.89 ml  Filed Weights   02/10/19 0500 02/11/19 0441 02/12/19 0500  Weight: 63.2 kg 63.7 kg 61 kg    Examination: General exam: Appears comfortable  HEENT: PERRLA, oral mucosa moist, no sclera icterus or thrush Respiratory system: Clear to auscultation. Respiratory effort normal. Cardiovascular system: S1 & S2 heard,  No murmurs  Gastrointestinal system: Abdomen soft, NG tube present- non - distended. JP drain and Flexiseal also present. Dressing not opened.  Central nervous system: Alert and oriented to person only. No focal neurological deficits. Psychiatry:  anxious, restless, confused  Extremities: see picture Skin: see picture        Data Reviewed: I have personally reviewed following labs and imaging studies  CBC: Recent Labs  Lab 02/07/19 0348 02/08/19 0409 02/08/19 1407 02/09/19 0528 02/10/19 0351 02/11/19 0431  WBC 3.3* 4.5  --  6.2 5.9 6.6  NEUTROABS  --   --   --   --  4.3  --   HGB 7.0* 6.1* 7.5* 7.6* 7.3* 7.2*  HCT 20.2* 18.7* 23.0* 23.1* 22.8* 22.8*  MCV 87.1 89.5  --  88.2 89.8 91.6  PLT 31* 58*  --  78* 110* 841   Basic Metabolic Panel: Recent Labs  Lab 02/06/19 0147  02/07/19 0348 02/07/19 1609 02/08/19 0409 02/09/19 0528 02/09/19 1627 02/10/19 0351 02/11/19 0431 02/11/19 0644  NA 128*   < > 128* 129* 129* 133*  --  136 136  --   K 3.0*   < > 3.0* 3.3* 3.1* 3.2* 3.7 3.1* 3.4*  --   CL 86*   < > 86* 89* 89* 96*  --  99 103   --   CO2 25   < > _0 --  25 24  --   GLUCOSE 105*   < > 72 169* 234* 251*  --  143* 199*  --   BUN 53*   < > 50* 51* 46* 41*  --  36* 33*  --   CREATININE 2.66*   < > 2.52* 2.49* 2.38* 2.01*  --  1.77* 1.50*  --   CALCIUM 6.4*   < > 7.0* 6.9* 7.3* 7.2*  --  7.6* 7.5*  --   MG 2.0   < > 1.9  --  2.3 2.1  --  2.1 2.0  --   PHOS 5.0*  --   --   --   --   --   --  3.2  --  3.0   < > = values in this interval not displayed.   GFR: Estimated Creatinine Clearance: 35.6 mL/min (A) (by C-G formula based on SCr of 1.5 mg/dL (H)). Liver Function Tests: Recent Labs  Lab 02/10/19 0351  AST 15  ALT 19  ALKPHOS 57  BILITOT 0.6  PROT 4.1*  ALBUMIN 1.2*   No results for input(s): LIPASE, AMYLASE in the last 168 hours. No results for input(s): AMMONIA in the last 168 hours. Coagulation Profile: No results for input(s): INR, PROTIME in the last 168 hours. Cardiac Enzymes: No results for input(s): CKTOTAL, CKMB, CKMBINDEX, TROPONINI in the last 168 hours. BNP (last 3 results) No results for input(s): PROBNP in the last 8760 hours. HbA1C: No results for input(s): HGBA1C in the last 72 hours. CBG: Recent Labs  Lab 02/11/19 0813 02/11/19 1154 02/11/19 1611 02/11/19 1934 02/11/19 2128  GLUCAP 224* 198* 229* 173* 148*   Lipid Profile: Recent Labs    02/10/19 0351  TRIG 134   Thyroid Function Tests:  No results for input(s): TSH, T4TOTAL, FREET4, T3FREE, THYROIDAB in the last 72 hours. Anemia Panel: Recent Labs    02/10/19 0830 02/11/19 0431  VITAMINB12 5,528*  --   FOLATE  --  10.2  FERRITIN  --  304  TIBC  --  106*  IRON  --  14*  RETICCTPCT  --  1.2   Urine analysis:    Component Value Date/Time   COLORURINE AMBER (A) 02/02/2019 1744   APPEARANCEUR CLOUDY (A) 02/02/2019 1744   LABSPEC 1.023 02/02/2019 1744   PHURINE 5.0 02/02/2019 1744   GLUCOSEU >=500 (A) 02/02/2019 1744   HGBUR MODERATE (A) 02/02/2019 [place] 02/02/2019 1744    BILIRUBINUR N 04/28/2016 1603   KETONESUR 5 (A) 02/02/2019 1744   PROTEINUR 100 (A) 02/02/2019 1744   UROBILINOGEN 0.2 04/28/2016 1603   UROBILINOGEN 0.2 06/18/2013 0650   NITRITE NEGATIVE 02/02/2019 1744   LEUKOCYTESUR LARGE (A) 02/02/2019 1744   Sepsis Labs: _0 (procalcitonin:4,lacticidven:4) ) Recent Results (from the past 240 hour(s))  Urine culture     Status: Abnormal   Collection Time: 02/02/19  5:44 PM   Specimen: In/Out Cath Urine  Result Value Ref Range Status   Specimen Description   Final    IN/OUT CATH URINE Performed at [facility], [place] [address], [place], [place] [address]    Special Requests   Final    NONE Performed at [facility], [place] [address], [place], [place] [address]    Culture >=100,000 COLONIES/mL KLEBSIELLA PNEUMONIAE (A)  Final   Report Status 02/04/2019 FINAL  Final   Organism ID, Bacteria KLEBSIELLA PNEUMONIAE (A)  Final      Susceptibility   Klebsiella pneumoniae - MIC*    AMPICILLIN >=32 RESISTANT Resistant     CEFAZOLIN <=4 SENSITIVE Sensitive     CEFTRIAXONE <=1 SENSITIVE Sensitive     CIPROFLOXACIN <=0.25 SENSITIVE Sensitive     GENTAMICIN <=1 SENSITIVE Sensitive     IMIPENEM <=0.25 SENSITIVE Sensitive     NITROFURANTOIN 64 INTERMEDIATE Intermediate     TRIMETH/SULFA <=20 SENSITIVE Sensitive     AMPICILLIN/SULBACTAM 4 SENSITIVE Sensitive     PIP/TAZO <=4 SENSITIVE Sensitive     Extended ESBL NEGATIVE Sensitive     * >=100,000 COLONIES/mL KLEBSIELLA PNEUMONIAE  Blood Culture (routine x 2)     Status: Abnormal (Preliminary result)   Collection Time: 02/02/19  6:16 PM   Specimen: BLOOD  Result Value Ref Range Status   Specimen Description   Final    BLOOD LEFT ANTECUBITAL Performed at [facility] [address], [place], [place] [address]    Special Requests   Final    BOTTLES DRAWN AEROBIC AND ANAEROBIC Blood Culture adequate volume Performed at [facility] [address], [place], [place] [address]    Culture  Setup Time (A)  Final    YEAST ANAEROBIC BOTTLE ONLY CRITICAL RESULT CALLED TO, READ BACK BY AND VERIFIED WITH: Christean Grief PHARMD, AT 1062 02/04/19 BY D. VANHOOK    Culture (A)  Final    CANDIDA GLABRATA Sent to [place] for further susceptibility testing. Performed at [facility], [place] [address], [place], [place] [address]    Report Status PENDING  Incomplete  Blood Culture ID Panel (Reflexed)     Status: Abnormal   Collection Time: 02/02/19  6:16 PM  Result Value Ref Range Status   Enterococcus species NOT DETECTED NOT DETECTED Final   Listeria monocytogenes  NOT DETECTED NOT DETECTED Final   Staphylococcus species NOT DETECTED NOT DETECTED Final   Staphylococcus aureus (BCID) NOT DETECTED NOT DETECTED Final   Streptococcus species NOT DETECTED NOT DETECTED Final   Streptococcus agalactiae NOT DETECTED NOT DETECTED Final   Streptococcus pneumoniae NOT DETECTED NOT DETECTED Final   Streptococcus pyogenes NOT DETECTED NOT DETECTED Final   Acinetobacter baumannii NOT DETECTED NOT DETECTED Final   Enterobacteriaceae species NOT DETECTED NOT DETECTED Final   Enterobacter cloacae complex NOT DETECTED NOT DETECTED Final   Escherichia coli NOT DETECTED NOT DETECTED Final   Klebsiella oxytoca NOT DETECTED NOT DETECTED Final   Klebsiella pneumoniae NOT DETECTED NOT DETECTED Final   Proteus species NOT DETECTED NOT DETECTED Final   Serratia marcescens NOT DETECTED NOT DETECTED Final   Haemophilus influenzae NOT DETECTED NOT DETECTED Final   Neisseria meningitidis NOT DETECTED NOT DETECTED Final   Pseudomonas aeruginosa NOT DETECTED NOT DETECTED Final   Candida albicans NOT DETECTED NOT DETECTED Final   Candida glabrata DETECTED (A) NOT DETECTED Final    Comment: CRITICAL RESULT CALLED TO, READ BACK BY AND VERIFIED WITH: J. LEGGE PHARMD, AT 8502 02/04/19 BY D. VANHOOK    Candida krusei NOT DETECTED NOT  DETECTED Final   Candida parapsilosis NOT DETECTED NOT DETECTED Final   Candida tropicalis NOT DETECTED NOT DETECTED Final    Comment: Performed at [facility], [address], [place], [place] [address]  SARS Coronavirus 2 by RT PCR (hospital order, performed in [facility] hospital lab) Nasopharyngeal Nasopharyngeal Swab     Status: None   Collection Time: 02/02/19  9:13 PM   Specimen: Nasopharyngeal Swab  Result Value Ref Range Status   SARS Coronavirus 2 NEGATIVE NEGATIVE Final    Comment: (NOTE) If result is NEGATIVE SARS-CoV-2 target nucleic acids are NOT DETECTED. The SARS-CoV-2 RNA is generally detectable in upper and lower  respiratory specimens during the acute phase of infection. The lowest  concentration of SARS-CoV-2 viral copies this assay can detect is 250  copies / mL. A negative result does not preclude SARS-CoV-2 infection  and should not be used as the sole basis for treatment or other  patient management decisions.  A negative result may occur with  improper specimen collection / handling, submission of specimen other  than nasopharyngeal swab, presence of viral mutation(s) within the  areas targeted by this assay, and inadequate number of viral copies  (<250 copies / mL). A negative result must be combined with clinical  observations, patient history, and epidemiological information. If result is POSITIVE SARS-CoV-2 target nucleic acids are DETECTED. The SARS-CoV-2 RNA is generally detectable in upper and lower  respiratory specimens dur ing the acute phase of infection.  Positive  results are indicative of active infection with SARS-CoV-2.  Clinical  correlation with patient history and other diagnostic information is  necessary to determine patient infection status.  Positive results do  not rule out bacterial infection or co-infection with other viruses. If result is PRESUMPTIVE POSTIVE SARS-CoV-2 nucleic acids MAY BE PRESENT.   A presumptive positive  result was obtained on the submitted specimen  and confirmed on repeat testing.  While 2019 novel coronavirus  (SARS-CoV-2) nucleic acids may be present in the submitted sample  additional confirmatory testing may be necessary for epidemiological  and / or clinical management purposes  to differentiate between  SARS-CoV-2 and other Sarbecovirus currently known to infect humans.  If clinically indicated additional testing with an alternate test  methodology [phone])  is advised. The SARS-CoV-2 RNA is generally  detectable in upper and lower respiratory sp ecimens during the acute  phase of infection. The expected result is Negative. Fact Sheet for Patients:  StrictlyIdeas.no Fact Sheet for Healthcare Providers: BankingDealers.co.za This test is not yet approved or cleared by the [place] FDA and has been authorized for detection and/or diagnosis of SARS-CoV-2 by FDA under an Emergency Use Authorization (EUA).  This EUA will remain in effect (meaning this test can be used) for the duration of the COVID-19 declaration under Section 564(b)(1) of the Act, 21 U.S.C. section 360bbb-3(b)(1), unless the authorization is terminated or revoked sooner. Performed at [facility], [place] [address], [place], [place] [address]   Aerobic/Anaerobic Culture (surgical/deep wound)     Status: None   Collection Time: 02/03/19  2:25 AM   Specimen: Peritoneal; Body Fluid  Result Value Ref Range Status   Specimen Description   Final    PERITONEAL Performed at [facility] [address], [place], [place] [address]    Special Requests   Final    NONE Performed at [facility], [place] [address], [place], [place] [address]    Gram Stain NO WBC SEEN RARE BUDDING YEAST SEEN   Final   Culture   Final    ABUNDANT CANDIDA ALBICANS MODERATE CANDIDA GLABRATA NO ANAEROBES ISOLATED Performed at [facility], [address], [place], [place] [address]    Report Status 02/08/2019 FINAL  Final  MRSA PCR Screening     Status: None   Collection Time: 02/03/19  2:53 AM   Specimen: Nasopharyngeal  Result Value Ref Range Status   MRSA by PCR NEGATIVE NEGATIVE Final    Comment:        The GeneXpert MRSA Assay (FDA approved for NASAL specimens only), is one component of a comprehensive MRSA colonization surveillance program. It is not intended to diagnose MRSA infection nor to guide or monitor treatment for MRSA infections. Performed at [facility], [place] [address] [place] [address], [place], [place] [address]   Culture, blood (routine x 2)     Status: None   Collection Time: 02/04/19 11:02 AM   Specimen: BLOOD RIGHT HAND  Result Value Ref Range Status   Specimen Description   Final    BLOOD RIGHT HAND Performed at [facility], [place] [address] [place], [place] [address]    Special Requests   Final    BOTTLES DRAWN AEROBIC ONLY Blood Culture results may not be optimal due to an inadequate volume of blood received in culture bottles Performed at [facility] [address], [place], [place] [address]    Culture   Final    NO GROWTH 5 DAYS Performed at [facility], [place] [address], [place], [place] [address]    Report Status 02/09/2019 FINAL  Final  Culture, blood (routine x 2)     Status: None   Collection Time: 02/04/19 11:25 AM   Specimen: BLOOD  Result Value Ref Range Status   Specimen Description   Final    BLOOD RIGHT ARM Performed at [facility] [address], [place], [place] [address]    Special Requests   Final    BOTTLES DRAWN AEROBIC AND ANAEROBIC Blood Culture results may not be optimal due to an inadequate volume of blood received in culture bottles Performed at [facility] [address] [place], [place] [address]  Culture   Final    NO GROWTH 5 DAYS Performed at [facility], [place] [address], [place], [place] [address]    Report Status 02/09/2019 FINAL  Final  Culture, blood (routine x 2)     Status: None (Preliminary result)   Collection Time: 02/08/19 12:13 PM   Specimen: BLOOD  Result Value Ref Range Status   Specimen Description   Final    BLOOD RIGHT WRIST Performed at [facility] [address] [place], [place] [address]    Special Requests   Final    BOTTLES DRAWN AEROBIC AND ANAEROBIC Blood Culture adequate volume Performed at [facility] [address] [place], [place] [address]    Culture   Final    NO GROWTH 4 DAYS Performed at [facility], [place] [address], [place], [place] [address]    Report Status PENDING  Incomplete  Culture, blood (routine x 2)     Status: None (Preliminary result)   Collection Time: 02/08/19 12:28 PM   Specimen: BLOOD  Result Value Ref Range Status   Specimen Description   Final    BLOOD RIGHT ARM Performed at [facility] [address], [place], [place] [address]    Special Requests   Final    BOTTLES DRAWN AEROBIC ONLY Blood Culture results may not be optimal due to an inadequate volume of blood received in culture bottles Performed at [facility] [address] [place], [place] [address]    Culture   Final    NO GROWTH 4 DAYS Performed at [facility], [place] [address] [place], [place] [address]    Report Status PENDING  Incomplete         Radiology Studies: Ct Abdomen Wo Contrast  Result Date: 02/10/2019 CLINICAL DATA:  Eval for perforation. EXAM: CT ABDOMEN WITHOUT CONTRAST TECHNIQUE: Multidetector CT imaging of the abdomen was performed following the standard protocol without IV contrast. COMPARISON:  Upper GI evaluation of the same date and CT abdomen and pelvis of 02/02/2019. FINDINGS: Lower chest: Basilar collapse/consolidation with small bilateral pleural effusions. No signs of pericardial  effusion Hepatobiliary: Loculated appearing fluid along the a [place] margin measures approximately 7 by 2.8 cm scalloping the liver margin. Limited assessment a left hepatic lobe due to extreme streak artifact from recently administered gastrointestinal contrast. Signs of cholelithiasis. Pancreas: Not well evaluated. No gross abnormality within the pancreatic bed. Spleen: Not well evaluated due to streak artifact. Adrenals/Urinary Tract: Normal adrenal glands. No signs of hydronephrosis. Stomach/Bowel: Dense contrast material administered during an upper GI procedure. Irregularity of the antral pyloric stomach compatible likely related to repair of gastric ulcer, surgical drain terminates anterior to the antro pyloric stomach and exits via right sub hepatic abdominal wall. There is a question of very subtle added density within the lumen of the drainage catheter. The density of material surrounding the stomach does not show definitive evidence of a leak however. Mixing of contrast, the small amount of contrast that did pass the level of the ulcer repair is noted within the lumen of distended small bowel loops becoming more dilute distally. There are numerous distended small bowel loops. Since the previous study there has been resolution of the diffuse enteric pneumatosis. Small amount of fluid is seen along the anterior margin of the abdomen just deep to the abdominal wall, anterior to dilated loops, measuring approximately 8 mm in greatest thickness deep to the left rectus muscle (  image 43, series 2. Vascular/Lymphatic: Calcified atherosclerotic changes throughout the abdominal aorta. No signs of adenopathy. Other: Perihepatic fluid with loculated appearance. Dilated loops of bowel described above, associated with mesenteric edema and a small amount of peritoneal fluid. Musculoskeletal: No signs of acute bone finding or evidence of destructive bone process. IMPRESSION: 1. No signs of gross leak; however, there is  a question of very subtle added density within the lumen of the drain, raising the question of a tiny leak that cannot be confirmed by the presence of a collection of extraluminal contrast. Suggest close follow-up perhaps with repeat CT utilizing dilute water-soluble contrast administered via existing gastric tube after resolution of pyloric edema that is seen on today's study. This presumed edema restricts flow of the contrast despite oblique positioning of the patient during the fluoroscopic exam. 2. Interval resolution of the diffuse enteric pneumatosis now with diffuse presumed ileus. Continued attention on follow-up is suggested. 3. Postoperative perihepatic fluid collection with lenticular contour compatible with loculated fluid. 4. Small amount of fluid along the anterior margin of the abdomen just deep to the left rectus muscle, measuring approximately 8 mm in greatest thickness. 5. Basilar collapse/consolidation with small bilateral pleural effusions. 6. These results will be called to the ordering clinician or representative by the Radiologist Assistant, and communication documented in the PACS or zVision Dashboard. Aortic Atherosclerosis (ICD10-I70.0). Electronically Signed   By: Zetta Bills M.D.   On: 02/10/2019 17:31   Dg Duanne Limerick W Single Cm (sol Or Thin Ba)  Result Date: 02/10/2019 CLINICAL DATA:  History of perforated gastric ulcer. EXAM: WATER SOLUBLE UPPER GI SERIES TECHNIQUE: Single-column upper GI series was performed using water soluble contrast. CONTRAST:  100 cc Omnipaque administered per existing G-tube period. COMPARISON:  CT abdomen and pelvis from 02/02/2019. FLUOROSCOPY TIME:  Fluoroscopy Time:  2 minutes 18 seconds Radiation Exposure Index (if provided by the fluoroscopic device): 20.4 mGy Number of Acquired Spot Images: 4 FINDINGS: Scout image obtained shows a G-tube coiled in the stomach and a surgical drain entering the image from the right likely terminating in the area around the  repair site. Bowel distension in the upper abdomen is noted, small bowel loops reaching caliber near the 2-1/2 to 3 cm. Slow injection of iodinated contrast material pooled in the stomach, confined mainly to the fundus and body of stomach despite attempts at oblique positioning. This was complicated by the patient's debilitated state and recent surgery. Limited contrast passed through the pyloric channel. Irregularity of this channel likely relates to the repair and 2 significant edema in the area. IMPRESSION: 1. Presumed bowel edema following repair, limiting flow of contrast from the stomach into the small bowel, for this reason the exam the subsequent CT or limited with respect to detection of small leaks. There is no gross leak on the images which were obtained for today's study. 2. The patient was taken to CT following this procedure in an attempt to localize smaller amounts of I had and 80 contrast which might be present but not detectable by fluoroscopy. Please see dedicated report for further detail. 3. Signs of presumed ileus.  Suggest attention on follow-up. Electronically Signed   By: Zetta Bills M.D.   On: 02/10/2019 17:36      Scheduled Meds:  chlorhexidine gluconate (MEDLINE KIT)  15 mL Mouth Rinse BID   Chlorhexidine Gluconate Cloth  6 each Topical Daily   Gerhardt's butt cream   Topical TID   heparin injection (subcutaneous)  5,000 Units Subcutaneous Q8H  insulin aspart  0-15 Units Subcutaneous QID   pantoprazole (PROTONIX) IV  40 mg Intravenous Q12H   sodium chloride flush  10-40 mL Intracatheter Q12H   Continuous Infusions:  sodium chloride     anidulafungin 100 mg (02/12/19 0954)   D-10-0.45% Sodium Chloride with KCL 40 meq/L 1000 ml 125 mL/hr at 02/12/19 0400   piperacillin-tazobactam (ZOSYN)  IV 3.375 g (02/12/19 0629)     LOS: 10 days      [name], MD [facility] Pager: [email or web address] Password [facility] 02/12/2019, 10:21 AM

## 2019-02-12 NOTE — Progress Notes (Addendum)
Patient ID: Gina [name], female   DOB: [birth year], 58 y.o.   MRN: [id number]    10 Days Post-Op  Subjective: Patient is more alert today, but very confused.  She doesn't know where she is or year.  She thinks we are trying to kill her or set her on fire.  She is scared "I woke up naked."  She is very paranoid in her confusion.  I tried to reassure her that we are trying to help her get better and that she is in the hospital and had surgery.  This did not soothe her at all.  ROS: unable due to AMS  Objective: Vital signs in last 24 hours: Temp:  [97.6 F (36.4 C)-99.9 F (37.7 C)] 97.6 F (36.4 C) (12/02 0400) Pulse Rate:  [94-103] 102 (12/02 0700) Resp:  [17-23] 21 (12/02 0700) BP: (104-139)/(43-64) 125/46 (12/02 0700) SpO2:  [100 %] 100 % (12/02 0400) Weight:  [61 kg] 61 kg (12/02 0500) Last BM Date: 02/12/19  Intake/Output from previous day: 12/01 0701 - 12/02 0700 In: 2337.9 [I.V.:1824; IV Piggyback:513.9] Out: 3825 [Urine:925; Emesis/NG output:2775; Drains:25; Stool:100] Intake/Output this shift: No intake/output data recorded.  PE: Gen: alert, but confused Heart: regular, minimally tachy Lungs: CTAB Abd: soft, NGT with bilious output 2700cc yesterday, midline wound intact and clean with packing in place.  JP drain with minimal serous output.  flexi-seal also in place with some stool present Ext: B stumps with stable areas of ischemia, but with blistering of all these areas  Lab Results:  Recent Labs    02/10/19 0351 02/11/19 0431  WBC 5.9 6.6  HGB 7.3* 7.2*  HCT 22.8* 22.8*  PLT 110* 161   BMET Recent Labs    02/10/19 0351 02/11/19 0431  NA 136 136  K 3.1* 3.4*  CL 99 103  CO2 25 24  GLUCOSE 143* 199*  BUN 36* 33*  CREATININE 1.77* 1.50*  CALCIUM 7.6* 7.5*   PT/INR No results for input(s): LABPROT, INR in the last 72 hours. CMP     Component Value Date/Time   NA 136 02/11/2019 0431   NA 139 10/04/2017 1705   K 3.4 (L) 02/11/2019 0431   CL 103  02/11/2019 0431   CO2 24 02/11/2019 0431   GLUCOSE 199 (H) 02/11/2019 0431   BUN 33 (H) 02/11/2019 0431   BUN 11 10/04/2017 1705   CREATININE 1.50 (H) 02/11/2019 0431   CREATININE 0.68 04/28/2016 1640   CALCIUM 7.5 (L) 02/11/2019 0431   PROT 4.1 (L) 02/10/2019 0351   PROT 7.1 10/04/2017 1705   ALBUMIN 1.2 (L) 02/10/2019 0351   ALBUMIN 3.7 10/04/2017 1705   AST 15 02/10/2019 0351   ALT 19 02/10/2019 0351   ALKPHOS 57 02/10/2019 0351   BILITOT 0.6 02/10/2019 0351   BILITOT <0.2 10/04/2017 1705   GFRNONAA 38 (L) 02/11/2019 0431   GFRNONAA >89 04/28/2016 1640   GFRAA 44 (L) 02/11/2019 0431   GFRAA >89 04/28/2016 1640   Lipase     Component Value Date/Time   LIPASE 27 04/01/2016 1040       Studies/Results: Ct Abdomen Wo Contrast  Result Date: 02/10/2019 CLINICAL DATA:  Eval for perforation. EXAM: CT ABDOMEN WITHOUT CONTRAST TECHNIQUE: Multidetector CT imaging of the abdomen was performed following the standard protocol without IV contrast. COMPARISON:  Upper GI evaluation of the same date and CT abdomen and pelvis of 02/02/2019. FINDINGS: Lower chest: Basilar collapse/consolidation with small bilateral pleural effusions. No signs of pericardial effusion Hepatobiliary: Loculated appearing  fluid along the a Paddock margin measures approximately 7 by 2.8 cm scalloping the liver margin. Limited assessment a left hepatic lobe due to extreme streak artifact from recently administered gastrointestinal contrast. Signs of cholelithiasis. Pancreas: Not well evaluated. No gross abnormality within the pancreatic bed. Spleen: Not well evaluated due to streak artifact. Adrenals/Urinary Tract: Normal adrenal glands. No signs of hydronephrosis. Stomach/Bowel: Dense contrast material administered during an upper GI procedure. Irregularity of the antral pyloric stomach compatible likely related to repair of gastric ulcer, surgical drain terminates anterior to the antro pyloric stomach and exits via right  sub hepatic abdominal wall. There is a question of very subtle added density within the lumen of the drainage catheter. The density of material surrounding the stomach does not show definitive evidence of a leak however. Mixing of contrast, the small amount of contrast that did pass the level of the ulcer repair is noted within the lumen of distended small bowel loops becoming more dilute distally. There are numerous distended small bowel loops. Since the previous study there has been resolution of the diffuse enteric pneumatosis. Small amount of fluid is seen along the anterior margin of the abdomen just deep to the abdominal wall, anterior to dilated loops, measuring approximately 8 mm in greatest thickness deep to the left rectus muscle (image 43, series 2. Vascular/Lymphatic: Calcified atherosclerotic changes throughout the abdominal aorta. No signs of adenopathy. Other: Perihepatic fluid with loculated appearance. Dilated loops of bowel described above, associated with mesenteric edema and a small amount of peritoneal fluid. Musculoskeletal: No signs of acute bone finding or evidence of destructive bone process. IMPRESSION: 1. No signs of gross leak; however, there is a question of very subtle added density within the lumen of the drain, raising the question of a tiny leak that cannot be confirmed by the presence of a collection of extraluminal contrast. Suggest close follow-up perhaps with repeat CT utilizing dilute water-soluble contrast administered via existing gastric tube after resolution of pyloric edema that is seen on today's study. This presumed edema restricts flow of the contrast despite oblique positioning of the patient during the fluoroscopic exam. 2. Interval resolution of the diffuse enteric pneumatosis now with diffuse presumed ileus. Continued attention on follow-up is suggested. 3. Postoperative perihepatic fluid collection with lenticular contour compatible with loculated fluid. 4. Small  amount of fluid along the anterior margin of the abdomen just deep to the left rectus muscle, measuring approximately 8 mm in greatest thickness. 5. Basilar collapse/consolidation with small bilateral pleural effusions. 6. These results will be called to the ordering clinician or representative by the Radiologist Assistant, and communication documented in the PACS or zVision Dashboard. Aortic Atherosclerosis (ICD10-I70.0). Electronically Signed   By: Zetta Bills M.D.   On: 02/10/2019 17:31   Dg Duanne Limerick W Single Cm (sol Or Thin Ba)  Result Date: 02/10/2019 CLINICAL DATA:  History of perforated gastric ulcer. EXAM: WATER SOLUBLE UPPER GI SERIES TECHNIQUE: Single-column upper GI series was performed using water soluble contrast. CONTRAST:  100 cc Omnipaque administered per existing G-tube period. COMPARISON:  CT abdomen and pelvis from 02/02/2019. FLUOROSCOPY TIME:  Fluoroscopy Time:  2 minutes 18 seconds Radiation Exposure Index (if provided by the fluoroscopic device): 20.4 mGy Number of Acquired Spot Images: 4 FINDINGS: Scout image obtained shows a G-tube coiled in the stomach and a surgical drain entering the image from the right likely terminating in the area around the repair site. Bowel distension in the upper abdomen is noted, small bowel loops reaching caliber  near the 2-1/2 to 3 cm. Slow injection of iodinated contrast material pooled in the stomach, confined mainly to the fundus and body of stomach despite attempts at oblique positioning. This was complicated by the patient's debilitated state and recent surgery. Limited contrast passed through the pyloric channel. Irregularity of this channel likely relates to the repair and 2 significant edema in the area. IMPRESSION: 1. Presumed bowel edema following repair, limiting flow of contrast from the stomach into the small bowel, for this reason the exam the subsequent CT or limited with respect to detection of small leaks. There is no gross leak on the  images which were obtained for today's study. 2. The patient was taken to CT following this procedure in an attempt to localize smaller amounts of I had and 80 contrast which might be present but not detectable by fluoroscopy. Please see dedicated report for further detail. 3. Signs of presumed ileus.  Suggest attention on follow-up. Electronically Signed   By: Zetta Bills M.D.   On: 02/10/2019 17:36    Anti-infectives: Anti-infectives (From admission, onward)   Start     Dose/Rate Route Frequency Ordered Stop   02/05/19 1000  anidulafungin (ERAXIS) 100 mg in sodium chloride 0.9 % 100 mL IVPB     100 mg 78 mL/hr over 100 Minutes Intravenous Every 24 hours 02/04/19 0840     02/04/19 1000  anidulafungin (ERAXIS) 200 mg in sodium chloride 0.9 % 200 mL IVPB     200 mg 78 mL/hr over 200 Minutes Intravenous  Once 02/04/19 0840 02/04/19 1301   02/04/19 0200  fluconazole (DIFLUCAN) IVPB 200 mg  Status:  Discontinued     200 mg 100 mL/hr over 60 Minutes Intravenous Every 24 hours 02/02/19 2302 02/04/19 0840   02/03/19 1800  vancomycin (VANCOCIN) IVPB 750 mg/150 ml premix  Status:  Discontinued     750 mg 150 mL/hr over 60 Minutes Intravenous Every 24 hours 02/03/19 0241 02/03/19 0242   02/03/19 1800  vancomycin (VANCOCIN) IVPB 750 mg/150 ml premix  Status:  Discontinued     750 mg 150 mL/hr over 60 Minutes Intravenous Every 24 hours 02/03/19 0242 02/03/19 0858   02/03/19 0600  piperacillin-tazobactam (ZOSYN) IVPB 3.375 g     3.375 g 12.5 mL/hr over 240 Minutes Intravenous Every 8 hours 02/03/19 0243     02/02/19 2315  fluconazole (DIFLUCAN) IVPB 400 mg     400 mg 100 mL/hr over 120 Minutes Intravenous STAT 02/02/19 2301 02/03/19 0218   02/02/19 1800  ceFEPIme (MAXIPIME) 2 g in sodium chloride 0.9 % 100 mL IVPB     2 g 200 mL/hr over 30 Minutes Intravenous  Once 02/02/19 1745 02/02/19 2030   02/02/19 1800  metroNIDAZOLE (FLAGYL) IVPB 500 mg     500 mg 100 mL/hr over 60 Minutes Intravenous   Once 02/02/19 1745 02/02/19 2237   02/02/19 1800  vancomycin (VANCOCIN) IVPB 1000 mg/200 mL premix  Status:  Discontinued     1,000 mg 200 mL/hr over 60 Minutes Intravenous  Once 02/02/19 1745 02/02/19 1747   02/02/19 1800  vancomycin (VANCOCIN) 1,250 mg in sodium chloride 0.9 % 250 mL IVPB     1,250 mg 166.7 mL/hr over 90 Minutes Intravenous  Once 02/02/19 1747 02/02/19 2237       Assessment/Plan VDRF- extubated 11/29 Poorly controlled DM2 - A1c15.2 S/p bilateral BKA  Ischemic changes to both LE - defer to medicine Acute onCKD  HTN Schizophrenia DKA Leukopenia Severe malnutrition, Prealbumin <5 on  11/30 Anemia  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  Patient more alert today, but very confused Candida glabratabacteremia - as soon as line holiday is complete, per ID's timing, she will need a new PICC and TNA restarted for nutrition given persistent inability to enterally feed her.  POD 10, S/pex lap with oversew of duodenal ulcer,with Graham patch 11/23,Dr. Lucia Gaskins, for perforated prepyloric ulcer with gross contamination - BID wet to dry dressing changes to midline abdominal wound - pulm toilet and IS -OOB to chair as much as possible -UGI and CT scan reviewed.  CT questions a possible tiny leak, but isn't clear.  Unfortunately her UGI shows significant edema causing contrast to not excrete through the pylorus.  -NGT output almost 3L yesterday still suggesting minimal opening of her pylorus.  Cont on suction and await resolution prior to removal of enteral feeds -as soon as line can be replaced, TNA needs to be restarted -mobilize, PT/OT  ID - Eraxis 11/24>>Zosyn 11/23 >> afebrile FEN - NPO/NGT VTE - NoSCDs,Bilateral BKa's - sq heparin  Foley - out Follow up -Dr. Lucia Gaskins   LOS: 10 days    Henreitta Cea , [facility] Surgery 02/12/2019, 8:37 AM Please see Amion for  pager number during day hours 7:00am-4:30pm  Agree with above.  Will keep NGT for now.  Will need TPN when central line can be replaced.  Alphonsa Overall, MD, [facility] Surgery Office phone:  [phone]

## 2019-02-12 NOTE — Progress Notes (Signed)
Pt appears to be increasingly confused and disoriented with hallucinations. Oriented only to self. Pt is having full conversations with a figure who is not in the room. Pt continues to moan; denying pain. RN will continue to assess and monitor pt for safety.

## 2019-02-13 ENCOUNTER — Inpatient Hospital Stay: Payer: Self-pay

## 2019-02-13 LAB — BASIC METABOLIC PANEL
Anion gap: 7 (ref 5–15)
BUN: 26 mg/dL — ABNORMAL HIGH (ref 6–20)
CO2: 23 mmol/L (ref 22–32)
Calcium: 7.6 mg/dL — ABNORMAL LOW (ref 8.9–10.3)
Chloride: 110 mmol/L (ref 98–111)
Creatinine, Ser: 1.27 mg/dL — ABNORMAL HIGH (ref 0.44–1.00)
GFR calc Af Amer: 54 mL/min — ABNORMAL LOW (ref >60)
GFR calc non Af Amer: 47 mL/min — ABNORMAL LOW (ref >60)
Glucose, Bld: 107 mg/dL — ABNORMAL HIGH (ref 70–99)
Potassium: 3.9 mmol/L (ref 3.5–5.1)
Sodium: 140 mmol/L (ref 135–145)

## 2019-02-13 LAB — CBC
HCT: 25.3 % — ABNORMAL LOW (ref 36.0–46.0)
Hemoglobin: 7.7 g/dL — ABNORMAL LOW (ref 12.0–15.0)
MCH: 28.7 pg (ref 26.0–34.0)
MCHC: 30.4 g/dL (ref 30.0–36.0)
MCV: 94.4 fL (ref 80.0–100.0)
Platelets: 282 10*3/uL (ref 150–400)
RBC: 2.68 MIL/uL — ABNORMAL LOW (ref 3.87–5.11)
RDW: 15.6 % — ABNORMAL HIGH (ref 11.5–15.5)
WBC: 5.2 10*3/uL (ref 4.0–10.5)
nRBC: 0 % (ref 0.0–0.2)

## 2019-02-13 LAB — CULTURE, BLOOD (ROUTINE X 2)
Culture: NO GROWTH
Culture: NO GROWTH
Special Requests: ADEQUATE

## 2019-02-13 LAB — GLUCOSE, CAPILLARY
Glucose-Capillary: 210 mg/dL — ABNORMAL HIGH (ref 70–99)
Glucose-Capillary: 106 mg/dL — ABNORMAL HIGH (ref 70–99)
Glucose-Capillary: 261 mg/dL — ABNORMAL HIGH (ref 70–99)
Glucose-Capillary: 142 mg/dL — ABNORMAL HIGH (ref 70–99)

## 2019-02-13 MED ORDER — SODIUM CHLORIDE 0.9% FLUSH
10.0000 mL | Freq: Two times a day (BID) | INTRAVENOUS | Status: DC
  Administered 2019-02-13 – 2019-02-23 (×21): 10 mL
  Administered 2019-02-24: 40 mL
  Administered 2019-02-24: 30 mL
  Administered 2019-02-25 – 2019-03-02 (×11): 10 mL
  Administered 2019-03-03: 20 mL
  Administered 2019-03-04 – 2019-03-08 (×9): 10 mL
  Administered 2019-03-09: 20 mL
  Administered 2019-03-09 – 2019-03-10 (×2): 10 mL

## 2019-02-13 MED ORDER — SODIUM CHLORIDE 0.9% FLUSH
10.0000 mL | INTRAVENOUS | Status: DC | PRN
  Administered 2019-02-20 – 2019-03-04 (×3): 10 mL

## 2019-02-13 MED ORDER — TRAVASOL 10 % IV SOLN
INTRAVENOUS | Status: AC
  Administered 2019-02-13: 22:00:00 via INTRAVENOUS
  Filled 2019-02-13: qty 432

## 2019-02-13 MED ORDER — SODIUM CHLORIDE 0.45 % IV SOLN
INTRAVENOUS | Status: DC
  Administered 2019-02-13 – 2019-02-14 (×2): via INTRAVENOUS

## 2019-02-13 NOTE — Progress Notes (Addendum)
Patient ID: Keyonda Mcnaney, female   DOB: 10/13/1960, 58 y.o.   MRN: [id number]    11 Days Post-Op  Subjective: Patient somnolent today.  Having less NGT output and move liquid stool from her flexi-seal.    ROS: unable due to AMS  Objective: Vital signs in last 24 hours: Temp:  [97.4 F (36.3 C)-98.2 F (36.8 C)] 97.5 F (36.4 C) (12/03 0400) Pulse Rate:  [79-99] 81 (12/03 0545) Resp:  [9-27] 11 (12/03 0545) BP: (100-160)/(48-99) 148/60 (12/03 0500) SpO2:  [100 %] 100 % (12/03 0400) Weight:  [54.4 kg] 54.4 kg (12/03 0500) Last BM Date: 02/12/19  Intake/Output from previous day: 12/02 0701 - 12/03 0700 In: 1634.2 [I.V.:1405.1; IV Piggyback:229.1] Out: 1625 [Urine:500; Emesis/NG output:600; Drains:25; Stool:500] Intake/Output this shift: No intake/output data recorded.  PE: Gen: somnolent, groans with palpation to her stumps but otherwise doesn't open her eyes for me or engage with me Abd: soft, relatively nontender, few BS, midline wound clean and packed, NGT with bilious output but down to 600cc in last 24 hrs.  Flexi-seal in place with copious bilious diarrhea.  Lab Results:  Recent Labs    02/11/19 0431 02/13/19 0211  WBC 6.6 5.2  HGB 7.2* 7.7*  HCT 22.8* 25.3*  PLT 161 282   BMET Recent Labs    02/12/19 1057 02/13/19 0211  NA 138 140  K 3.8 3.9  CL 106 110  CO2 21* 23  GLUCOSE 276* 107*  BUN 31* 26*  CREATININE 1.56* 1.27*  CALCIUM 7.7* 7.6*   PT/INR No results for input(s): LABPROT, INR in the last 72 hours. CMP     Component Value Date/Time   NA 140 02/13/2019 0211   NA 139 10/04/2017 1705   K 3.9 02/13/2019 0211   CL 110 02/13/2019 0211   CO2 23 02/13/2019 0211   GLUCOSE 107 (H) 02/13/2019 0211   BUN 26 (H) 02/13/2019 0211   BUN 11 10/04/2017 1705   CREATININE 1.27 (H) 02/13/2019 0211   CREATININE 0.68 04/28/2016 1640   CALCIUM 7.6 (L) 02/13/2019 0211   PROT 4.1 (L) 02/10/2019 0351   PROT 7.1 10/04/2017 1705   ALBUMIN 1.2 (L) 02/10/2019  0351   ALBUMIN 3.7 10/04/2017 1705   AST 15 02/10/2019 0351   ALT 19 02/10/2019 0351   ALKPHOS 57 02/10/2019 0351   BILITOT 0.6 02/10/2019 0351   BILITOT <0.2 10/04/2017 1705   GFRNONAA 47 (L) 02/13/2019 0211   GFRNONAA >89 04/28/2016 1640   GFRAA 54 (L) 02/13/2019 0211   GFRAA >89 04/28/2016 1640   Lipase     Component Value Date/Time   LIPASE 27 04/01/2016 1040       Studies/Results: Ct Head Wo Contrast  Result Date: 02/12/2019 CLINICAL DATA:  Altered mental status EXAM: CT HEAD WITHOUT CONTRAST TECHNIQUE: Contiguous axial images were obtained from the base of the skull through the vertex without intravenous contrast. COMPARISON:  02/02/2019 FINDINGS: Brain: No evidence of acute infarction, hemorrhage, hydrocephalus, extra-axial collection or mass lesion/mass effect. Vascular: No hyperdense vessel or unexpected calcification. Skull: Normal. Negative for fracture or focal lesion. Sinuses/Orbits: No acute finding. Other: None. IMPRESSION: No acute intracranial findings.  Stable exam from 02/02/2019. Electronically Signed   By: Davina Poke M.D.   On: 02/12/2019 14:59    Anti-infectives: Anti-infectives (From admission, onward)   Start     Dose/Rate Route Frequency Ordered Stop   02/05/19 1000  anidulafungin (ERAXIS) 100 mg in sodium chloride 0.9 % 100 mL IVPB  100 mg 78 mL/hr over 100 Minutes Intravenous Every 24 hours 02/04/19 0840     02/04/19 1000  anidulafungin (ERAXIS) 200 mg in sodium chloride 0.9 % 200 mL IVPB     200 mg 78 mL/hr over 200 Minutes Intravenous  Once 02/04/19 0840 02/04/19 1301   02/04/19 0200  fluconazole (DIFLUCAN) IVPB 200 mg  Status:  Discontinued     200 mg 100 mL/hr over 60 Minutes Intravenous Every 24 hours 02/02/19 2302 02/04/19 0840   02/03/19 1800  vancomycin (VANCOCIN) IVPB 750 mg/150 ml premix  Status:  Discontinued     750 mg 150 mL/hr over 60 Minutes Intravenous Every 24 hours 02/03/19 0241 02/03/19 0242   02/03/19 1800  vancomycin  (VANCOCIN) IVPB 750 mg/150 ml premix  Status:  Discontinued     750 mg 150 mL/hr over 60 Minutes Intravenous Every 24 hours 02/03/19 0242 02/03/19 0858   02/03/19 0600  piperacillin-tazobactam (ZOSYN) IVPB 3.375 g     3.375 g 12.5 mL/hr over 240 Minutes Intravenous Every 8 hours 02/03/19 0243     02/02/19 2315  fluconazole (DIFLUCAN) IVPB 400 mg     400 mg 100 mL/hr over 120 Minutes Intravenous STAT 02/02/19 2301 02/03/19 0218   02/02/19 1800  ceFEPIme (MAXIPIME) 2 g in sodium chloride 0.9 % 100 mL IVPB     2 g 200 mL/hr over 30 Minutes Intravenous  Once 02/02/19 1745 02/02/19 2030   02/02/19 1800  metroNIDAZOLE (FLAGYL) IVPB 500 mg     500 mg 100 mL/hr over 60 Minutes Intravenous  Once 02/02/19 1745 02/02/19 2237   02/02/19 1800  vancomycin (VANCOCIN) IVPB 1000 mg/200 mL premix  Status:  Discontinued     1,000 mg 200 mL/hr over 60 Minutes Intravenous  Once 02/02/19 1745 02/02/19 1747   02/02/19 1800  vancomycin (VANCOCIN) 1,250 mg in sodium chloride 0.9 % 250 mL IVPB     1,250 mg 166.7 mL/hr over 90 Minutes Intravenous  Once 02/02/19 1747 02/02/19 2237       Assessment/Plan VDRF- extubated 11/29 Poorly controlled DM2 - A1c15.2 S/p bilateral BKA Ischemic changes to both LE- defer to medicine, stable Acute onCKD  HTN Schizophrenia DKA Leukopenia Severe malnutrition, Prealbumin <5 on 11/30 Anemia  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  Likely plan to replace this today and restart TNA  POD 11,S/pex lap with oversew of duodenal ulcer,with Graham patch 11/23,Dr. Lucia Gaskins, for perforated prepyloric ulcer with gross contamination - BID wet to dry dressing changes to midline abdominal  wound -pulm toilet and IS -OOB to chair as much as possible -UGI and CT scan reviewed. CT questions a possible tiny leak, but isn't clear. Unfortunately her UGI shows significant edema causing contrast to not excrete through the pylorus.  -NGT output decreased today to 600 with increase in output in flexi-seal.  Hopefully this is showing resolution in "obstructive" type process in the stomach.  Cont on LIWS today, but if continues to decrease then can hopefully try to initiate trickle TFs tomorrow. -as soon as line can be replaced, TNA needs to be restarted, d/w Dr. Wynelle Cleveland, plan for today -mobilize, PT/OT  ID - Eraxis 11/24>>Zosyn 11/23 >> afebrile FEN - NPO/NGT VTE - NoSCDs,Bilateral BKa's -  sq heparin  Foley -out Follow up -Dr. Lucia Gaskins   LOS: 11 days    Henreitta Cea , [facility] Surgery 02/13/2019, 8:28 AM Please see Amion for pager number during day hours 7:00am-4:30pm  Agree with above. Plans to replace central venous access and start TPN.  Alphonsa Overall, MD, [facility] Surgery Office phone:  [phone]

## 2019-02-13 NOTE — Progress Notes (Signed)
PIV consult: Cancel, per RN.

## 2019-02-13 NOTE — Progress Notes (Signed)
Per IV team (PICC), needed to verify that ID had been contacted in regards to PICC placement. ID and attending MD confirmed that it's okay to insert PICC.  As instructed by IV team (PICC), order placed for IV team consult with instructions to insert PICC.  Will continue to monitor.

## 2019-02-13 NOTE — Consult Note (Signed)
Telepsych Consultation   Reason for Consult:  "Acute onset confusion/history anxiety and depression" Referring Physician:  Dr Wynelle Cleveland Location of Patient:  Location of Provider: [facility]  Patient Identification: Gina [name] MRN:  [id number] Principal Diagnosis: Septic shock [facility]) Diagnosis:  Principal Problem:   Septic shock ([place]) Active Problems:   DKA (diabetic ketoacidoses) ([place])   Acute kidney injury ([place])   Pressure injury of skin   Perforated gastric ulcer ([place])   Acute respiratory failure ([place])   Total Time spent with patient: 45 minutes  Subjective:   Gina [name] is a 58 y.o. female patient admitted with acute onset confusion.  Patient alert during assessment, patient oriented to self and place.  Patient denies suicidal and homicidal ideations.  Patient does not appear to be paranoid nor acutely psychotic at this time.  Patient unable to remember why she came to the hospital.  Patient states "I am scared that everybody is talking about nursing homes I want assisted living, not a nursing home."  Patient states when I answer questions I am supposed to have my daughter with me.  Patient gives her verbal consent to speak with daughter Gina [name], phone [phone] Spoke with patient's daughter Gina [name] who states: "My mom has had so many infections I think because of the infections she has become more agitated and anxious."  Gina [name] reports patient has "been taking Lexapro for anxiety for years."  Gina [name] denies any history of self-harm, or suicide attempts by the patient.  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 In the ED, she was hypothermic and severely hypotensive. Initial blood glucose >1000.  Past  Psychiatric History: Anxiety, Depression  Risk to Self:  No Risk to Others:  No Prior Inpatient Therapy:  No Prior Outpatient Therapy:  No  Past Medical History:  Past Medical History:  Diagnosis Date  . Diabetes mellitus   . Hypertension   . Osteomyelitis of right foot ([place]) 02/22/2017  . Schizophrenia ([place])   . Septic arthritis of interphalangeal joint of toe of left foot ([place]) 06/02/2016  . Stress incontinence 04/26/2017    Past Surgical History:  Procedure Laterality Date  . AMPUTATION Left 06/05/2016   Procedure: LEFT FOOT TRANSMETATARSAL AMPUTATION;  Surgeon: [name], MD;  Location: [facility];  Service: Orthopedics;  Laterality: Left;  . AMPUTATION Right 11/11/2017   Procedure: AMPUTATION BELOW KNEE;  Surgeon: [name], MD;  Location: [place];  Service: Orthopedics;  Laterality: Right;  . AMPUTATION Left 05/10/2018   Procedure: LEFT BELOW KNEE AMPUTATION;  Surgeon: [name], MD;  Location: [place];  Service: Orthopedics;  Laterality: Left;  . AMPUTATION Left 12/28/2018   Procedure: AMPUTATION THIRD DIGIT;  Surgeon: Iran Planas, MD;  Location: [facility];  Service: Orthopedics;  Laterality: Left;  . AMPUTATION TOE Right 02/23/2017   Procedure: AMPUTATION RIGHT THIRD TOE;  Surgeon: [name], MD;  Location: [place];  Service: Orthopedics;  Laterality: Right;  . I&D EXTREMITY Right 11/09/2017   Procedure: Debride Ulcer Right Heel;  Surgeon: [name], MD;  Location: [place];  Service: Orthopedics;  Laterality: Right;  . I&D EXTREMITY Left 12/28/2018   Procedure: IRRIGATION AND DEBRIDEMENT EXTREMITY;  Surgeon: Iran Planas, MD;  [facility];  Service: Orthopedics;  Laterality: Left;  . INCISION AND DRAINAGE OF WOUND Left 12/26/2018   Procedure: IRRIGATION AND DEBRIDEMENT LEFT HAND;  Surgeon: Iran Planas, MD;  [facility];  Service: Orthopedics;  Laterality: Left;  . LAPAROTOMY N/A 02/02/2019   Procedure: EXPLORATORY LAPAROTOMY WITH OVERSEW OF DUODENAL  ULCER;  Surgeon: Alphonsa Overall, MD;  Location[facility];  Service: General;  Laterality: N/A;  . TUBAL LIGATION     Family History:  Family History  Problem Relation Age of Onset  . Diabetes type II Father   . CAD Father   . Prostate cancer Father   . Diabetes Mellitus II Brother    Family Psychiatric  History: Anxiety, depression Social History:  Social History   Substance and Sexual Activity  Alcohol Use No   Comment: occasionally     Social History   Substance and Sexual Activity  Drug Use No    Social History   Socioeconomic History  . Marital status: Single    Spouse name: Not on file  . Number of children: Not on file  . Years of education: Not on file  . Highest education level: Not on file  Occupational History  . Not on file  Social Needs  . Financial resource strain: Not on file  . Food insecurity    Worry: Not on file    Inability: Not on file  . Transportation needs    Medical: Not on file    Non-medical: Not on file  Tobacco Use  . Smoking status: Never Smoker  . Smokeless tobacco: Never Used  Substance and Sexual Activity  . Alcohol use: No    Comment: occasionally  . Drug use: No  . Sexual activity: Not Currently  Lifestyle  . Physical activity    Days per week: Not on file    Minutes per session: Not on file  . Stress: Not on file  Relationships  . Social Herbalist on phone: Not on file    Gets together: Not on file    Attends religious service: Not on file    Active member of club or organization: Not on file    Attends meetings of clubs or organizations: Not on file    Relationship status: Not on file  Other Topics Concern  . Not on file  Social History Narrative  . Not on file   Additional Social History:    Allergies:   Allergies  Allergen Reactions  . Bee Venom Swelling    Swells up with bee stings   . Metformin And Related Other (See Comments)    Upset stomach    Labs:  Results for orders placed or  performed during the hospital encounter of 02/02/19 (from the past 48 hour(s))  Glucose, capillary     Status: Abnormal   Collection Time: 02/11/19  4:11 PM  Result Value Ref Range   Glucose-Capillary 229 (H) 70 - 99 mg/dL   Comment 1 Notify RN    Comment 2 Document in Chart   Glucose, capillary     Status: Abnormal   Collection Time: 02/11/19  7:34 PM  Result Value Ref Range   Glucose-Capillary 173 (H) 70 - 99 mg/dL  Glucose, capillary     Status: Abnormal   Collection Time: 02/11/19  9:28 PM  Result Value Ref Range   Glucose-Capillary 148 (H) 70 - 99 mg/dL   Comment 1 Notify RN    Comment 2 Document in Chart   Glucose, capillary  Status: Abnormal   Collection Time: 02/12/19  8:38 AM  Result Value Ref Range   Glucose-Capillary 274 (H) 70 - 99 mg/dL   Comment 1 Notify RN    Comment 2 Document in Chart   Basic metabolic panel     Status: Abnormal   Collection Time: 02/12/19 10:57 AM  Result Value Ref Range   Sodium 138 135 - 145 mmol/L   Potassium 3.8 3.5 - 5.1 mmol/L   Chloride 106 98 - 111 mmol/L   CO2 21 (L) 22 - 32 mmol/L   Glucose, Bld 276 (H) 70 - 99 mg/dL   BUN 31 (H) 6 - 20 mg/dL   Creatinine, Ser 1.56 (H) 0.44 - 1.00 mg/dL   Calcium 7.7 (L) 8.9 - 10.3 mg/dL   GFR calc non Af Amer 37 (L) >60 mL/min   GFR calc Af Amer 42 (L) >60 mL/min   Anion gap 11 5 - 15    Comment: Performed at [facility], [place] [address], [place], [place] [address]  Glucose, capillary     Status: Abnormal   Collection Time: 02/12/19 12:50 PM  Result Value Ref Range   Glucose-Capillary 195 (H) 70 - 99 mg/dL   Comment 1 Notify RN    Comment 2 Document in Chart   Urinalysis, Routine w reflex microscopic     Status: Abnormal   Collection Time: 02/12/19  2:01 PM  Result Value Ref Range   Color, Urine YELLOW YELLOW   APPearance CLOUDY (A) CLEAR   Specific Gravity, Urine 1.010 1.005 - 1.030   pH 5.0 5.0 - 8.0   Glucose, UA >=500 (A) NEGATIVE mg/dL   Hgb urine dipstick  MODERATE (A) NEGATIVE   Bilirubin Urine NEGATIVE NEGATIVE   Ketones, ur NEGATIVE NEGATIVE mg/dL   Protein, ur 30 (A) NEGATIVE mg/dL   Nitrite NEGATIVE NEGATIVE   Leukocytes,Ua NEGATIVE NEGATIVE   RBC / HPF 6-10 0 - 5 RBC/hpf   WBC, UA 21-50 0 - 5 WBC/hpf   Bacteria, UA RARE (A) NONE SEEN   Squamous Epithelial / LPF 0-5 0 - 5   WBC Clumps PRESENT    Budding Yeast PRESENT    Crystals PRESENT (A) NEGATIVE    Comment: Performed at [facility], [place] [address], [place], [place] [address]  Vitamin B12     Status: Abnormal   Collection Time: 02/12/19  2:19 PM  Result Value Ref Range   Vitamin B-12 >7,500 (H) 180 - 914 pg/mL    Comment: RESULTS CONFIRMED BY MANUAL DILUTION Performed at [facility], [place] [address] [place], [place] [address]   Ammonia     Status: Abnormal   Collection Time: 02/12/19  2:19 PM  Result Value Ref Range   Ammonia <9 (L) 9 - 35 umol/L    Comment: Performed at [facility], [place] [address], [place], [place] [address]  Glucose, capillary     Status: Abnormal   Collection Time: 02/12/19  2:22 PM  Result Value Ref Range   Glucose-Capillary 223 (H) 70 - 99 mg/dL  Glucose, capillary     Status: Abnormal   Collection Time: 02/12/19  4:43 PM  Result Value Ref Range   Glucose-Capillary 146 (H) 70 - 99 mg/dL   Comment 1 Notify RN    Comment 2 Document in Chart   Glucose, capillary     Status: None   Collection Time: 02/12/19  9:56 PM  Result Value Ref Range   Glucose-Capillary  96 70 - 99 mg/dL   Comment 1 Notify RN    Comment 2 Document in Chart   AM Labs bmp     Status: Abnormal   Collection Time: 02/13/19  2:11 AM  Result Value Ref Range   Sodium 140 135 - 145 mmol/L   Potassium 3.9 3.5 - 5.1 mmol/L   Chloride 110 98 - 111 mmol/L   CO2 23 22 - 32 mmol/L   Glucose, Bld 107 (H) 70 - 99 mg/dL   BUN 26 (H) 6 - 20 mg/dL   Creatinine, Ser 1.27 (H) 0.44 - 1.00 mg/dL   Calcium 7.6 (L) 8.9 - 10.3 mg/dL    GFR calc non Af Amer 47 (L) >60 mL/min   GFR calc Af Amer 54 (L) >60 mL/min   Anion gap 7 5 - 15    Comment: Performed at [facility], [place] [address], [place], [place] [address]  CBC Stat once     Status: Abnormal   Collection Time: 02/13/19  2:11 AM  Result Value Ref Range   WBC 5.2 4.0 - 10.5 K/uL   RBC 2.68 (L) 3.87 - 5.11 MIL/uL   Hemoglobin 7.7 (L) 12.0 - 15.0 g/dL   HCT 25.3 (L) 36.0 - 46.0 %   MCV 94.4 80.0 - 100.0 fL   MCH 28.7 26.0 - 34.0 pg   MCHC 30.4 30.0 - 36.0 g/dL   RDW 15.6 (H) 11.5 - 15.5 %   Platelets 282 150 - 400 K/uL   nRBC 0.0 0.0 - 0.2 %    Comment: Performed at [facility], [place] [address] [place], [place] [address]  Glucose, capillary     Status: Abnormal   Collection Time: 02/13/19  7:48 AM  Result Value Ref Range   Glucose-Capillary 210 (H) 70 - 99 mg/dL   Comment 1 Notify RN    Comment 2 Document in Chart   Glucose, capillary     Status: Abnormal   Collection Time: 02/13/19 11:55 AM  Result Value Ref Range   Glucose-Capillary 106 (H) 70 - 99 mg/dL   Comment 1 Notify RN    Comment 2 Document in Chart     Medications:  Current Facility-Administered Medications  Medication Dose Route Frequency Provider Last Rate Last Dose  . 0.45 % sodium chloride infusion   Intravenous Continuous Wofford, Drew A, RPH      . 0.9 %  sodium chloride infusion  250 mL Intravenous Continuous Bennie Pierini, MD      . acetaminophen (TYLENOL) suppository 650 mg  650 mg Rectal Q4H PRN Rigoberto Noel, MD   650 mg at 02/04/19 1238  . anidulafungin (ERAXIS) 100 mg in sodium chloride 0.9 % 100 mL IVPB  100 mg Intravenous Q24H Tommy Medal, Lavell Islam, MD   Stopped at 02/12/19 1134  . chlorhexidine gluconate (MEDLINE KIT) (PERIDEX) 0.12 % solution 15 mL  15 mL Mouth Rinse BID Bennie Pierini, MD   15 mL at 02/13/19 0942  . Chlorhexidine Gluconate Cloth 2 % PADS 6 each  6 each Topical Daily Bennie Pierini, MD   6 each at 02/12/19 [phone]   . dextrose 10 % 1,000 mL with sodium chloride 0.45 %, potassium chloride 40 mEq/L infusion   Intravenous Continuous Polly Cobia, RPH 125 mL/hr at 02/12/19 1900    . dextrose 50 % solution 0-50 mL  0-50 mL Intravenous PRN Bennie Pierini, MD   25 mL at 02/07/19 0409  .  Gerhardt's butt cream   Topical TID Noe Gens L, NP      . heparin injection 5,000 Units  5,000 Units Subcutaneous Q8H Margaretha Seeds, MD   5,000 Units at 02/13/19 0530  . insulin aspart (novoLOG) injection 0-15 Units  0-15 Units Subcutaneous QID [name], MD   5 Units at 02/13/19 0817  . lip balm (CARMEX) ointment   Topical PRN Margaretha Seeds, MD      . ondansetron [facility]) injection 4 mg  4 mg Intravenous Q6H PRN Bennie Pierini, MD      . pantoprazole (PROTONIX) injection 40 mg  40 mg Intravenous Q12H Alphonsa Overall, MD   40 mg at 02/13/19 1159  . piperacillin-tazobactam (ZOSYN) IVPB 3.375 g  3.375 g Intravenous Q8H Luiz Ochoa, [place]   Stopped at 02/13/19 6389  . TPN ADULT (ION)   Intravenous Continuous TPN Polly Cobia, RPH        Musculoskeletal: Strength & Muscle Tone: Unable to assess Gait & Station: Unable to assess Patient leans: Unable to assess  Psychiatric Specialty Exam: Physical Exam  Nursing note and vitals reviewed. Constitutional: She appears well-developed.  HENT:  Head: Normocephalic.  Cardiovascular: Normal rate.  Respiratory: Effort normal.  Neurological: She is alert.  Psychiatric: She has a normal mood and affect. Her behavior is normal. Thought content normal. Her speech is slurred. Cognition and memory are impaired.    Review of Systems  Constitutional: Negative.   HENT: Negative.   Eyes: Negative.   Respiratory: Negative.   Cardiovascular: Negative.   Gastrointestinal: Negative.   Genitourinary: Negative.   Musculoskeletal: Negative.   Skin: Negative.   Neurological: Negative.   Endo/Heme/Allergies: Negative.   Psychiatric/Behavioral: Positive for memory  loss.    Blood pressure 131/75, pulse 84, temperature (!) 96.9 F (36.1 C), temperature source Axillary, resp. rate 20, height 5' 2"  (1.575 m), weight 54.4 kg, last menstrual period 03/29/2015, SpO2 96 %.Body mass index is 21.94 kg/m.  General Appearance: Disheveled  Eye Contact:  Good  Speech:  Garbled and Normal Rate  Volume:  Normal  Mood:  Euphoric  Affect:  Non-Congruent  Thought Process:  Disorganized, Goal Directed and Descriptions of Associations: Loose  Orientation:  Other:  Person, place  Thought Content:  Paranoid Ideation  Suicidal Thoughts:  No  Homicidal Thoughts:  No  Memory:  Immediate;   Poor Recent;   Poor Remote;   Poor  Judgement:  Impaired  Insight:  Lacking  Psychomotor Activity:  Normal  Concentration:  Concentration: Poor and Attention Span: Poor  Recall:  Poor  Fund of Knowledge:  Fair  Language:  Fair  Akathisia:  No  Handed:  Right  AIMS (if indicated):     Assets:  Communication Skills Desire for Improvement Financial Resources/Insurance Housing Social Support  ADL's:  Impaired  Cognition:  Impaired,  Moderate  Sleep:        Treatment Plan Summary: Plan cleared by psychiatry  Consider Haldol 5 mg PO every 8 hours PRN as needed for escalating behavior if needed.   Disposition: No evidence of imminent risk to self or others at present.   Patient does not meet criteria for psychiatric inpatient admission. Supportive therapy provided about ongoing stressors. Discussed crisis plan, support from social network, calling 911, coming to the Emergency Department, and calling Suicide Hotline.  This service was provided via telemedicine using a 2-way, interactive audio and video technology.  Names of all persons participating in this telemedicine service and their  role in this encounter. Name: Bayler Nehring Role: Patient  Name: Loanne Drilling telephone Role: Daughter  Name: Letitia Libra Role: [place], FNP 02/13/2019 1:34  PM

## 2019-02-13 NOTE — Progress Notes (Signed)
Physical Therapy Treatment Patient Details Name: Gina [name] MRN: [id number] DOB: [birth year] Today's Date: 02/13/2019    History of Present Illness 58 yo female admitted with septic shock, perforated ulcer. S/P ex lap, oversewing of duodenal ulcer 11/23. Extubated 11/29. Hx of bil BKA (2019/2020), DM, schizophrenia    PT Comments    Pt assisted with rolling.  Pt had BM on linen so assisted with hygiene and changing bed pads.  Pt appears to have unstageable sacral pressure injury so would need pressure redistribution cushion for OOB to recliner (once able to tolerate/transfer). Uncertain of pt's baseline cognition however pt appears to be confused today however improved with time.   Follow Up Recommendations  SNF     Equipment Recommendations  None recommended by PT    Recommendations for Other Services       Precautions / Restrictions Precautions Precautions: Fall Precaution Comments: bil BKA, NG tube, multiple lines/leads, NPO, R jp drain    Mobility  Bed Mobility Overal bed mobility: Needs Assistance Bed Mobility: Rolling Rolling: +2 for safety/equipment;Max assist         General bed mobility comments: pt attempting to assist with rolling with upper body however required assist to complete and assist for lower body; changed linen and provided pericare (stool leaking around flexiseal, pt also with unstageable? sacral pressure injury (half covered with dressing)  Transfers                    Ambulation/Gait                 Stairs             Wheelchair Mobility    Modified Rankin (Stroke Patients Only)       Balance                                            Cognition Arousal/Alertness: Awake/alert Behavior During Therapy: WFL for tasks assessed/performed Overall Cognitive Status: No family/caregiver present to determine baseline cognitive functioning                               Problem Solving:  Slow processing;Requires verbal cues;Requires tactile cues General Comments: pt able to follow commands however talking about seeing children (hallucination?) and other nonrelated topics, easily redirected      Exercises      General Comments        Pertinent Vitals/Pain Pain Assessment: Faces Faces Pain Scale: No hurt Pain Intervention(s): Repositioned    Home Living                      Prior Function            PT Goals (current goals can now be found in the care plan section) Progress towards PT goals: Not progressing toward goals - comment(?cognition)    Frequency    Min 3X/week      PT Plan Current plan remains appropriate    Co-evaluation              AM-PAC PT "6 Clicks" Mobility   Outcome Measure  Help needed turning from your back to your side while in a flat bed without using bedrails?: A Lot Help needed moving from lying on your back to sitting on the side of a flat  bed without using bedrails?: A Lot Help needed moving to and from a bed to a chair (including a wheelchair)?: Total Help needed standing up from a chair using your arms (e.g., wheelchair or bedside chair)?: Total Help needed to walk in hospital room?: Total Help needed climbing 3-5 steps with a railing? : Total 6 Click Score: 8    End of Session   Activity Tolerance: Patient tolerated treatment well Patient left: with call bell/phone within reach;in bed;with bed alarm set   PT Visit Diagnosis: Muscle weakness (generalized) (M62.81);Other abnormalities of gait and mobility (R26.89)     Time: [id number] PT Time Calculation (min) (ACUTE ONLY): 28 min  Charges:  $Therapeutic Activity: 23-37 mins                    Carmelia Bake, PT, DPT Acute Rehabilitation Services Office: [phone] Pager: [phone]  Trena Platt 02/13/2019, 1:31 PM

## 2019-02-13 NOTE — Progress Notes (Signed)
Peripherally Inserted Central Catheter/Midline Placement  The IV Nurse has discussed with the patient and/or persons authorized to consent for the patient, the purpose of this procedure and the potential benefits and risks involved with this procedure.  The benefits include less needle sticks, lab draws from the catheter, and the patient may be discharged home with the catheter. Risks include, but not limited to, infection, bleeding, blood clot (thrombus formation), and puncture of an artery; nerve damage and irregular heartbeat and possibility to perform a PICC exchange if needed/ordered by physician.  Alternatives to this procedure were also discussed.  Bard Power PICC patient education guide, fact sheet on infection prevention and patient information card has been provided to patient /or left at bedside.    PICC/Midline Placement Documentation  PICC Double Lumen 02/13/19 PICC Right Brachial 32 cm 0 cm (Active)  Indication for Insertion or Continuance of Line Administration of hyperosmolar/irritating solutions (i.e. TPN, Vancomycin, etc.) 02/13/19 2019  Exposed Catheter (cm) 0 cm 02/13/19 2019  Site Assessment Clean;Dry;Intact 02/13/19 2019  Lumen #1 Status Blood return noted;Flushed;Saline locked 02/13/19 2019  Lumen #2 Status Blood return noted;Flushed;Saline locked 02/13/19 2019  Dressing Type Transparent;Occlusive;Securing device 02/13/19 2019  Dressing Status Clean;Dry;Intact;Antimicrobial disc in place 02/13/19 2019  Line Adjustment (NICU/IV Team Only) No 02/13/19 2019  Dressing Intervention New dressing 02/13/19 2019  Dressing Change Due 02/20/19 02/13/19 2019       Edson Snowball 02/13/2019, 8:37 PM

## 2019-02-13 NOTE — Progress Notes (Signed)
NUTRITION NOTE  Consult for TPN. Able to communicate with Pharmacist. Consult for TPN was received on Monday (11/30) and patient did receive custom TPN for ~24 hours starting that evening. TPN was then stopped 12/1 due to finding of fungemia. TPN being re-started today at 1800 at 30 ml/hr and ILE will be held for at least today.   No other nutrition-related changes over the past 3 days. Will continue to follow per protocol.    Jarome Matin, MS, RD, LDN, [facility] Inpatient Clinical Dietitian Pager # [phone] After hours/weekend pager # [phone]

## 2019-02-13 NOTE — Progress Notes (Signed)
PROGRESS NOTE    Gina [name]   IRJ:[id number]  DOB: [birth year]  DOA: 02/02/2019 PCP: [name], MD   Brief Narrative:  Gina [name] is a 58 y.o. 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 In the ED, she was hypothermic and severely hypotensive. Initial blood glucose >1000.   CT abdomen showed pneumoperitoneum and mesenteric ischemia. Admitted to ICU service and started on IV antibiotics.   11/23 > She underwent exploratory Laparotomy with finding of perforated prepyloric ulcer which was oversewn and 3.1 L of contaminated peritoneal fluid.   Extubated on 11/29 Hospital course complicated by DKA, AKI, acute blood loss anemia and acute thrombocytopenia.  Transferred out of ICU to [facility] on 02/10/19. General surgery following   Subjective: Sleepy this AM but able to be awakened.     Assessment & Plan:   Principal Problem:   Septic shock due to ruptured gastric ulcer with acute peritonitis, fungemia and UTI - ID consulted on 02/04/19 - Blood cultures from 11/22> Candida Glabrata- repeat blood culture 11/24 NGTD - Urine culture 11/22 > K pneumoniae - Peritoneal fluid 11/23> Candida albicans - Opthalmology consulted - no evidence of fungal endophthalmitis  - currently receiving Andulafungin and Zosyn - General surgery managing abdominal issues- NG tube to suction - still has quite a bit of output and thus she remains NPO- she is noted to have watery stool and despite the flexiseal, she is laying in a puddle of stool - ID has recommended to remove cental line to give line holiday and right IJ was removed on 12/1 - TPN on hold due to line holiday-   - place PICC today- Gen surgery to resume TPN    Active Problems:   DKA (diabetic ketoacidoses), severely  uncontrolled with hyperglycemia  - resolved - A1c on 11/23>  15.2 - Uses Lantus and Novolog as outpt- currently on sliding scale insulin and NPO  Hypokalemia - replacing    Acute kidney injury  - baseline Cr is < 1.0 - Cr rose to 2.66 on 11/26 and is steadily improving - foley cath removed on 11/30  Anxiety Acute delirium noted to begin around 11/30  - her only psych medication was Lexapro which has been on hold as she has been NPO - acute confusion, hallucinations, paranoia anxiety noted   - have asked for a psych eval - CT head ordered and is negative - Check UA for underlying infection (already received Zosyn)- UA shows rare bacteria but does show yeast and WBC which is not unexpected  - B12 is not low (elevated) - Ammonia < 9   Severe protein calorie malnutrition with hypoalbuminemia - NPO at this time- NG tube is still draining-   Anemia due to acute blood loss -  s/p 1 U PRBC on 11/28 for Hb of 6.1 -  remains slightly anemic with Hb ~ 7 -  anemia panel shows low normal Iron stores, Normal folate and elevated B12 -  transfused for Hb persistently < 7  Acute thrombocytopenia - related to septic shock - nadir was > 31 on 11/27 - Platelets have risen back to normal  Blebs and mottling on distal extremities - noted to have dusky skin with large blebs (not fluid filled) on stumps and hands-   - I am not sure how long these  have been present but I noted them on the day she was transferred to our service-- per RN who was taking care of her during the time they developed, it occurred after Levophed was started (? If she had ischemia from vasoconstriction) - extremities are warm and appear to be perfusing well - they do not appear to be progressing and actually are resolving but skin breakdown is occurring- local wound care being done- Wound care RN also consulted to give input  Elevated HCV antibody - 11/25 - HCV > 11.0  Left phalanx amputation  - on 12/28/18- Hand  surgery contacted by general surgery on 11/30 to request suture removal  B/ l BKA   Time spent in minutes: 30 min  DVT prophylaxis: heparin Code Status: Full code Family Communication: daughter Gina [name] Disposition Plan: cont to follow in SDU- her condition remains tenuous  Consultants:   General surgery  ID Procedures:   Intubation/ Extubation  Ex lap Antimicrobials:  Anti-infectives (From admission, onward)   Start     Dose/Rate Route Frequency Ordered Stop   02/05/19 1000  anidulafungin (ERAXIS) 100 mg in sodium chloride 0.9 % 100 mL IVPB     100 mg 78 mL/hr over 100 Minutes Intravenous Every 24 hours 02/04/19 0840     02/04/19 1000  anidulafungin (ERAXIS) 200 mg in sodium chloride 0.9 % 200 mL IVPB     200 mg 78 mL/hr over 200 Minutes Intravenous  Once 02/04/19 0840 02/04/19 1301   02/04/19 0200  fluconazole (DIFLUCAN) IVPB 200 mg  Status:  Discontinued     200 mg 100 mL/hr over 60 Minutes Intravenous Every 24 hours 02/02/19 2302 02/04/19 0840   02/03/19 1800  vancomycin (VANCOCIN) IVPB 750 mg/150 ml premix  Status:  Discontinued     750 mg 150 mL/hr over 60 Minutes Intravenous Every 24 hours 02/03/19 0241 02/03/19 0242   02/03/19 1800  vancomycin (VANCOCIN) IVPB 750 mg/150 ml premix  Status:  Discontinued     750 mg 150 mL/hr over 60 Minutes Intravenous Every 24 hours 02/03/19 0242 02/03/19 0858   02/03/19 0600  piperacillin-tazobactam (ZOSYN) IVPB 3.375 g     3.375 g 12.5 mL/hr over 240 Minutes Intravenous Every 8 hours 02/03/19 0243     02/02/19 2315  fluconazole (DIFLUCAN) IVPB 400 mg     400 mg 100 mL/hr over 120 Minutes Intravenous STAT 02/02/19 2301 02/03/19 0218   02/02/19 1800  ceFEPIme (MAXIPIME) 2 g in sodium chloride 0.9 % 100 mL IVPB     2 g 200 mL/hr over 30 Minutes Intravenous  Once 02/02/19 1745 02/02/19 2030   02/02/19 1800  metroNIDAZOLE (FLAGYL) IVPB 500 mg     500 mg 100 mL/hr over 60 Minutes Intravenous  Once 02/02/19 1745 02/02/19 2237    02/02/19 1800  vancomycin (VANCOCIN) IVPB 1000 mg/200 mL premix  Status:  Discontinued     1,000 mg 200 mL/hr over 60 Minutes Intravenous  Once 02/02/19 1745 02/02/19 1747   02/02/19 1800  vancomycin (VANCOCIN) 1,250 mg in sodium chloride 0.9 % 250 mL IVPB     1,250 mg 166.7 mL/hr over 90 Minutes Intravenous  Once 02/02/19 1747 02/02/19 2237       Objective: Vitals:   02/13/19 0515 02/13/19 0530 02/13/19 0545 02/13/19 0800  BP:    138/62  Pulse: 83 84 81 85  Resp: 14 13 11  (!) 9  Temp:    97.6 F (36.4 C)  TempSrc:    Axillary  SpO2:  100%  Weight:      Height:        Intake/Output Summary (Last 24 hours) at 02/13/2019 1136 Last data filed at 02/13/2019 0400 Gross per 24 hour  Intake 1102.84 ml  Output 1225 ml  Net -122.16 ml   Filed Weights   02/11/19 0441 02/12/19 0500 02/13/19 0500  Weight: 63.7 kg 61 kg 54.4 kg    Examination: General exam: Appears comfortable  HEENT: PERRLA, oral mucosa moist, no sclera icterus or thrush Respiratory system: Clear to auscultation. Respiratory effort normal. Cardiovascular system: S1 & S2 heard,  No murmurs  Gastrointestinal system: Abdomen soft, poor bowel sounds. NG tube, drain and Flexiseal present. Midline incision noted.  Psychiatry:  remains confused- at times agitated and at times lethargic Extremities: see picture Skin: see picture        Data Reviewed: I have personally reviewed following labs and imaging studies  CBC: Recent Labs  Lab 02/08/19 0409 02/08/19 1407 02/09/19 0528 02/10/19 0351 02/11/19 0431 02/13/19 0211  WBC 4.5  --  6.2 5.9 6.6 5.2  NEUTROABS  --   --   --  4.3  --   --   HGB 6.1* 7.5* 7.6* 7.3* 7.2* 7.7*  HCT 18.7* 23.0* 23.1* 22.8* 22.8* 25.3*  MCV 89.5  --  88.2 89.8 91.6 94.4  PLT 58*  --  78* 110* 161 161   Basic Metabolic Panel: Recent Labs  Lab 02/07/19 0348  02/08/19 0409 02/09/19 0528 02/09/19 1627 02/10/19 0351 02/11/19 0431 02/11/19 0644 02/12/19 1057 02/13/19  0211  NA 128*   < > 129* 133*  --  136 136  --  138 140  K 3.0*   < > 3.1* 3.2* 3.7 3.1* 3.4*  --  3.8 3.9  CL 86*   < > 89* 96*  --  99 103  --  106 110  CO2 27   < > 26 26  --  25 24  --  21* 23  GLUCOSE 72   < > 234* 251*  --  143* 199*  --  276* 107*  BUN 50*   < > 46* 41*  --  36* 33*  --  31* 26*  CREATININE 2.52*   < > 2.38* 2.01*  --  1.77* 1.50*  --  1.56* 1.27*  CALCIUM 7.0*   < > 7.3* 7.2*  --  7.6* 7.5*  --  7.7* 7.6*  MG 1.9  --  2.3 2.1  --  2.1 2.0  --   --   --   PHOS  --   --   --   --   --  3.2  --  3.0  --   --    < > = values in this interval not displayed.   GFR: Estimated Creatinine Clearance: 38.7 mL/min (A) (by C-G formula based on SCr of 1.27 mg/dL (H)). Liver Function Tests: Recent Labs  Lab 02/10/19 0351  AST 15  ALT 19  ALKPHOS 57  BILITOT 0.6  PROT 4.1*  ALBUMIN 1.2*   No results for input(s): LIPASE, AMYLASE in the last 168 hours. Recent Labs  Lab 02/12/19 1419  AMMONIA <9*   Coagulation Profile: No results for input(s): INR, PROTIME in the last 168 hours. Cardiac Enzymes: No results for input(s): CKTOTAL, CKMB, CKMBINDEX, TROPONINI in the last 168 hours. BNP (last 3 results) No results for input(s): PROBNP in the last 8760 hours. HbA1C: No results for input(s): HGBA1C in the last 72 hours.  CBG: Recent Labs  Lab 02/12/19 1250 02/12/19 1422 02/12/19 1643 02/12/19 2156 02/13/19 0748  GLUCAP 195* 223* 146* 96 210*   Lipid Profile: No results for input(s): CHOL, HDL, LDLCALC, TRIG, CHOLHDL, LDLDIRECT in the last 72 hours. Thyroid Function Tests: No results for input(s): TSH, T4TOTAL, FREET4, T3FREE, THYROIDAB in the last 72 hours. Anemia Panel: Recent Labs    02/11/19 0431 02/12/19 1419  VITAMINB12  --  >7,500*  FOLATE 10.2  --   FERRITIN 304  --   TIBC 106*  --   IRON 14*  --   RETICCTPCT 1.2  --    Urine analysis:    Component Value Date/Time   COLORURINE YELLOW 02/12/2019 1401   APPEARANCEUR CLOUDY (A) 02/12/2019 1401    LABSPEC 1.010 02/12/2019 1401   PHURINE 5.0 02/12/2019 1401   GLUCOSEU >=500 (A) 02/12/2019 1401   HGBUR MODERATE (A) 02/12/2019 1401   BILIRUBINUR NEGATIVE 02/12/2019 1401   BILIRUBINUR N 04/28/2016 1603   KETONESUR NEGATIVE 02/12/2019 1401   PROTEINUR 30 (A) 02/12/2019 1401   UROBILINOGEN 0.2 04/28/2016 1603   UROBILINOGEN 0.2 06/18/2013 0650   NITRITE NEGATIVE 02/12/2019 1401   LEUKOCYTESUR NEGATIVE 02/12/2019 1401   Sepsis Labs: @LABRCNTIP (procalcitonin:4,lacticidven:4) ) Recent Results (from the past 240 hour(s))  Culture, blood (routine x 2)     Status: None   Collection Time: 02/04/19 11:02 AM   Specimen: BLOOD RIGHT HAND  Result Value Ref Range Status   Specimen Description   Final    BLOOD RIGHT HAND Performed at [facility], [place] [address], [place], [place] [address]    Special Requests   Final    BOTTLES DRAWN AEROBIC ONLY Blood Culture results may not be optimal due to an inadequate volume of blood received in culture bottles Performed at [facility] [address], [place], [place] [address]    Culture   Final    NO GROWTH 5 DAYS Performed at [facility], [place] [address], [place], [place] [address]    Report Status 02/09/2019 FINAL  Final  Culture, blood (routine x 2)     Status: None   Collection Time: 02/04/19 11:25 AM   Specimen: BLOOD  Result Value Ref Range Status   Specimen Description   Final    BLOOD RIGHT ARM Performed at [facility] [address] [place], [place] [address]    Special Requests   Final    BOTTLES DRAWN AEROBIC AND ANAEROBIC Blood Culture results may not be optimal due to an inadequate volume of blood received in culture bottles Performed at [facility] [address] [place], [place] [address]    Culture   Final    NO GROWTH 5 DAYS Performed at [facility], [place] [address] [place], [place] [address]    Report Status 02/09/2019 FINAL  Final   Culture, blood (routine x 2)     Status: None   Collection Time: 02/08/19 12:13 PM   Specimen: BLOOD  Result Value Ref Range Status   Specimen Description   Final    BLOOD RIGHT WRIST Performed at [facility] [address], [place], [place] [address]    Special Requests   Final    BOTTLES DRAWN AEROBIC AND ANAEROBIC Blood Culture adequate volume Performed at [facility] [address], [place], [place] [address]    Culture   Final    NO GROWTH 5 DAYS Performed at [facility]  [facility], [place] [address], [place], [place] [address]    Report Status 02/13/2019 FINAL  Final  Culture, blood (routine x 2)     Status: None   Collection Time: 02/08/19 12:28 PM   Specimen: BLOOD  Result Value Ref Range Status   Specimen Description   Final    BLOOD RIGHT ARM Performed at [facility] [address], [place], [place] [address]    Special Requests   Final    BOTTLES DRAWN AEROBIC ONLY Blood Culture results may not be optimal due to an inadequate volume of blood received in culture bottles Performed at [facility] [address] [place], [place] [address]    Culture   Final    NO GROWTH 5 DAYS Performed at [facility], [place] [address] [place], [place] [address]    Report Status 02/13/2019 FINAL  Final         Radiology Studies: Ct Head Wo Contrast  Result Date: 02/12/2019 CLINICAL DATA:  Altered mental status EXAM: CT HEAD WITHOUT CONTRAST TECHNIQUE: Contiguous axial images were obtained from the base of the skull through the vertex without intravenous contrast. COMPARISON:  02/02/2019 FINDINGS: Brain: No evidence of acute infarction, hemorrhage, hydrocephalus, extra-axial collection or mass lesion/mass effect. Vascular: No hyperdense vessel or unexpected calcification. Skull: Normal. Negative for fracture or focal lesion. Sinuses/Orbits: No acute finding. Other: None. IMPRESSION: No acute  intracranial findings.  Stable exam from 02/02/2019. Electronically Signed   By: Davina Poke M.D.   On: 02/12/2019 14:59   [place] Ekg Site Rite  Result Date: 02/13/2019 If Site Rite image not attached, placement could not be confirmed due to current cardiac rhythm.     Scheduled Meds: . chlorhexidine gluconate (MEDLINE KIT)  15 mL Mouth Rinse BID  . Chlorhexidine Gluconate Cloth  6 each Topical Daily  . Gerhardt's butt cream   Topical TID  . heparin injection (subcutaneous)  5,000 Units Subcutaneous Q8H  . insulin aspart  0-15 Units Subcutaneous QID  . pantoprazole (PROTONIX) IV  40 mg Intravenous Q12H   Continuous Infusions: . sodium chloride    . anidulafungin Stopped (02/12/19 1134)  . D-10-0.45% Sodium Chloride with KCL 40 meq/L 1000 ml 125 mL/hr at 02/12/19 1900  . piperacillin-tazobactam (ZOSYN)  IV 3.375 g (02/13/19 0529)     LOS: 11 days      [name], MD [facility] Pager: [email or web address] Password [email or web address] 02/13/2019, 11:36 AM

## 2019-02-13 NOTE — Progress Notes (Signed)
[facility]    Date of Admission:  02/02/2019   Total days of antibiotics 12 anidula/piptazo           ID: Gina [name] is a 58 y.o. female with  sepsis due to intra-abdominal infection 2/2 gastric ulcer perforation c/b candidemia and malnutrition Principal Problem:   Septic shock ([place]) Active Problems:   DKA (diabetic ketoacidoses) ([place])   Acute kidney injury ([place])   Pressure injury of skin   Perforated gastric ulcer ([place])   Acute respiratory failure ([place])    Subjective: Has had 48hr line holiday. Remains afebrile. Able to see her during her bathtime - she has large pressure ulcer with eschar in the wound bed  Medications:  . chlorhexidine gluconate (MEDLINE KIT)  15 mL Mouth Rinse BID  . Chlorhexidine Gluconate Cloth  6 each Topical Daily  . Gerhardt's butt cream   Topical TID  . heparin injection (subcutaneous)  5,000 Units Subcutaneous Q8H  . insulin aspart  0-15 Units Subcutaneous QID  . pantoprazole (PROTONIX) IV  40 mg Intravenous Q12H    Objective: Vital signs in last 24 hours: Temp:  [96.9 F (36.1 C)-97.8 F (36.6 C)] 96.9 F (36.1 C) (12/03 1200) Pulse Rate:  [79-90] 84 (12/03 1400) Resp:  [9-27] 15 (12/03 1400) BP: (103-157)/(48-99) 128/54 (12/03 1400) SpO2:  [96 %-100 %] 96 % (12/03 1341) Weight:  [54.4 kg] 54.4 kg (12/03 0500) Physical Exam  Constitutional:  oriented to person, place, and time. appears chronically ill and mall-nourished. No distress.  HENT: [place]/AT, PERRLA, no scleral icterus. NGT--+640m Mouth/Throat: Oropharynx is clear and moist. No oropharyngeal exudate.  Cardiovascular: Normal rate, regular rhythm and normal heart sounds. Exam reveals no gallop and no friction rub.  No murmur heard.  Pulmonary/Chest: Effort normal and breath sounds normal. No respiratory distress.  has no wheezes.  Abd: midline incision is clean and packed. Has rectal tube in place Skin: eschar in wound bed to buttock bilaterally at  cleft Ext: bilateral aka with desquamation for bullae to lower extremities Psychiatric: a normal mood and affect.  behavior is normal.   Lab Results Recent Labs    02/11/19 0431 02/12/19 1057 02/13/19 0211  WBC 6.6  --  5.2  HGB 7.2*  --  7.7*  HCT 22.8*  --  25.3*  NA 136 138 140  K 3.4* 3.8 3.9  CL 103 106 110  CO2 24 21* 23  BUN 33* 31* 26*  CREATININE 1.50* 1.56* 1.27*   Liver Panel No results for input(s): PROT, ALBUMIN, AST, ALT, ALKPHOS, BILITOT, BILIDIR, IBILI in the last 72 hours. Sedimentation Rate No results for input(s): ESRSEDRATE in the last 72 hours. C-Reactive Protein No results for input(s): CRP in the last 72 hours.  Microbiology: 11/28 blood cx ngtd Studies/Results: Ct Head Wo Contrast  Result Date: 02/12/2019 CLINICAL DATA:  Altered mental status EXAM: CT HEAD WITHOUT CONTRAST TECHNIQUE: Contiguous axial images were obtained from the base of the skull through the vertex without intravenous contrast. COMPARISON:  02/02/2019 FINDINGS: Brain: No evidence of acute infarction, hemorrhage, hydrocephalus, extra-axial collection or mass lesion/mass effect. Vascular: No hyperdense vessel or unexpected calcification. Skull: Normal. Negative for fracture or focal lesion. Sinuses/Orbits: No acute finding. Other: None. IMPRESSION: No acute intracranial findings.  Stable exam from 02/02/2019. Electronically Signed   By: NDavina PokeM.D.   On: 02/12/2019 14:59   [place] Site Rite  Result Date: 02/13/2019 If Site Rite image not attached, placement could not  be confirmed due to current cardiac rhythm.    Assessment/Plan: Fungemia = use 12/1 as day 1 of treatment x 14 days with anidulafungin. She will need to be on through 12/14  Intra-abdominal infection s/p drain = currently on day #11. Would give 3 more days to complete 14 day course of piptazo. Then monitor off off of abtx  Severe malnutrition = will restart TNA. Still having significant output from NG, awaiting  to see if obstruction can decrease so that TF can be initiated.  [facility] Cell: [phone] Pager: [phone]  02/13/2019, 5:13 PM

## 2019-02-13 NOTE — Progress Notes (Signed)
PHARMACY - TOTAL PARENTERAL NUTRITION CONSULT NOTE   Indication: Small bowel obstruction  Patient Measurements: Height: 5\' 2"  (157.5 cm) Weight: 119 lb 14.9 oz (54.4 kg) IBW/kg (Calculated) : 50.1 TPN AdjBW (KG): 57.9 Body mass index is 21.94 kg/m. Usual Weight: 55-60 kg  Assessment:  49 yoF with poorly controlled DM s/p BL BKA, admitted 11/23 with AMS, n/v, abdominal pain. CT abdomen showed pneumoperitoneum and mesenteric ischemia. Underwent ex lap on 11/23 with finding of perforated prepyloric ulcer which was oversewn, along with gross contamination of the peritoneum. Hospital course complicated by DKA, AKI, acute thrombocytopenia, and C. glabrata candidemia. Patient remains with high NGT d/t bowel edema as demonstrated on UGI. TPN started on 11/30 but was stopped shortly thereafter to facilitate central line holiday for candidemia. Repeat blood cultures remain negative to date, and TPN now restarting with replacement of central line. Patient at risk for refeeding given prolonged lack of nutrition.  Glucose / Insulin: Hx DM; CBGs erratic and poorly controlled on D10 infusion (range 96-274; goal 100-150) - 15 units SSI yesterday (on D10 at 125 ml/hr) Electrolytes: all improved to WNL; corrected Ca 9.8 Renal: AKI peaked 11/24, BUN/SCr continue to improve (baseline SCr 0.5-1.0); bicarb improved to WNL; UOP remains borderline low; likely losing fluids from NG output LFTs / TGs: WNL (11/30) Prealbumin / albumin: both low (11/30) I/O, MIVF: D10 + 1/2 NS w/ 40 meq K GI Imaging: - 11/30 UGI: high NG output & bowel edema Surgeries / Procedures:  - 11/22 ex lap: oversew of duodenal ulcer with [name] patch  Central access: PICC TPN start date: 12/3  Nutritional Goals (per RD recommendation on 11/30): Kcal:  2025-2330 kcal Protein:  115-130 grams Fluid:  >/= 2 L/day Goal TPN rate is 90 mL/hr (provides 129 g of protein and 2047 kcals per day) - GIR 3.3 mg/kg/min  Current Nutrition:   NPO  Plan:   Start TPN at 30 mL/hr at 1800  Hold lipid emulsion for first 7 days for critically ill patients per ASPEN guidelines (Start date 12/9) - not ICU status but will hold d/t current fungemia. If patient appears clinically stable from ID standpoint could consider resuming lipids before 7 days.  Electrolytes in TPN: 78mEq/L Na, 88mEq/L K, 37mEq/L Ca, 30mEq/L Mg, and 25mmol/L of Phos; Cl:Ac ratio 1:1  Add standard MVI and trace elements to TPN  Continue moderate SSI with q6h CBG checks and adjust as needed   Change MIVF to 1/2 NS at 100 ml/hr; remove D10 and potassium  Monitor TPN labs on Mon/Thurs  Full panel tomorrow  Reuel Boom, PharmD, BCPS [phone] 02/13/2019, 10:33 AM

## 2019-02-14 LAB — DIFFERENTIAL
Abs Immature Granulocytes: 0.03 10*3/uL (ref 0.00–0.07)
Basophils Absolute: 0.1 10*3/uL (ref 0.0–0.1)
Basophils Relative: 1 %
Eosinophils Absolute: 0.1 10*3/uL (ref 0.0–0.5)
Eosinophils Relative: 1 %
Immature Granulocytes: 1 %
Lymphocytes Relative: 19 %
Lymphs Abs: 1 10*3/uL (ref 0.7–4.0)
Monocytes Absolute: 0.5 10*3/uL (ref 0.1–1.0)
Monocytes Relative: 9 %
Neutro Abs: 3.9 10*3/uL (ref 1.7–7.7)
Neutrophils Relative %: 69 %

## 2019-02-14 LAB — COMPREHENSIVE METABOLIC PANEL
ALT: 12 U/L (ref 0–44)
AST: 9 U/L — ABNORMAL LOW (ref 15–41)
Albumin: 1.2 g/dL — ABNORMAL LOW (ref 3.5–5.0)
Alkaline Phosphatase: 56 U/L (ref 38–126)
Anion gap: 10 (ref 5–15)
BUN: 23 mg/dL — ABNORMAL HIGH (ref 6–20)
CO2: 21 mmol/L — ABNORMAL LOW (ref 22–32)
Calcium: 7.7 mg/dL — ABNORMAL LOW (ref 8.9–10.3)
Chloride: 111 mmol/L (ref 98–111)
Creatinine, Ser: 1.08 mg/dL — ABNORMAL HIGH (ref 0.44–1.00)
GFR calc Af Amer: >60 mL/min (ref >60)
GFR calc non Af Amer: 57 mL/min — ABNORMAL LOW (ref >60)
Glucose, Bld: 123 mg/dL — ABNORMAL HIGH (ref 70–99)
Potassium: 4 mmol/L (ref 3.5–5.1)
Sodium: 142 mmol/L (ref 135–145)
Total Bilirubin: 0.6 mg/dL (ref 0.3–1.2)
Total Protein: 4.9 g/dL — ABNORMAL LOW (ref 6.5–8.1)

## 2019-02-14 LAB — CBC
HCT: 24.6 % — ABNORMAL LOW (ref 36.0–46.0)
Hemoglobin: 7.4 g/dL — ABNORMAL LOW (ref 12.0–15.0)
MCH: 28.4 pg (ref 26.0–34.0)
MCHC: 30.1 g/dL (ref 30.0–36.0)
MCV: 94.3 fL (ref 80.0–100.0)
Platelets: 296 10*3/uL (ref 150–400)
RBC: 2.61 MIL/uL — ABNORMAL LOW (ref 3.87–5.11)
RDW: 15.5 % (ref 11.5–15.5)
WBC: 5.5 10*3/uL (ref 4.0–10.5)
nRBC: 0 % (ref 0.0–0.2)

## 2019-02-14 LAB — GLUCOSE, CAPILLARY
Glucose-Capillary: 199 mg/dL — ABNORMAL HIGH (ref 70–99)
Glucose-Capillary: 176 mg/dL — ABNORMAL HIGH (ref 70–99)
Glucose-Capillary: 194 mg/dL — ABNORMAL HIGH (ref 70–99)

## 2019-02-14 LAB — MAGNESIUM: Magnesium: 1.8 mg/dL (ref 1.7–2.4)

## 2019-02-14 LAB — PREALBUMIN: Prealbumin: 5.2 mg/dL — ABNORMAL LOW (ref 18–38)

## 2019-02-14 LAB — TRIGLYCERIDES: Triglycerides: 126 mg/dL (ref <150)

## 2019-02-14 LAB — PHOSPHORUS: Phosphorus: 3 mg/dL (ref 2.5–4.6)

## 2019-02-14 MED ORDER — MORPHINE SULFATE (PF) 2 MG/ML IV SOLN
1.0000 mg | INTRAVENOUS | Status: DC | PRN
  Administered 2019-02-14 – 2019-02-20 (×15): 2 mg via INTRAVENOUS
  Administered 2019-02-21 (×2): 1 mg via INTRAVENOUS
  Administered 2019-02-21 – 2019-03-02 (×26): 2 mg via INTRAVENOUS
  Administered 2019-03-02: 1 mg via INTRAVENOUS
  Administered 2019-03-03 – 2019-03-10 (×22): 2 mg via INTRAVENOUS
  Filled 2019-02-14 (×67): qty 1

## 2019-02-14 MED ORDER — SODIUM CHLORIDE 0.45 % IV SOLN
INTRAVENOUS | Status: AC
  Administered 2019-02-14 – 2019-02-17 (×3): via INTRAVENOUS

## 2019-02-14 MED ORDER — TRAVASOL 10 % IV SOLN
INTRAVENOUS | Status: AC
  Administered 2019-02-14 (×2): via INTRAVENOUS
  Filled 2019-02-14: qty 720

## 2019-02-14 NOTE — Progress Notes (Signed)
Pharmacy Antibiotic Note  Gina [name] is a 58 y.o. female admitted on 02/02/2019 with septic shock secondary to peritonitis with perforated viscus. Blood cx + C. Glabrata.  Pharmacy has been consulted for Zosyn and Eraxis dosing.  02/14/2019: D12/14 Zosyn EI for peritonitis, last day 12/06, then observe off abx per ID D11 Eraxis 100 mg q24 > 12/14 as last day per ID  Plan: Continue Zosyn 3.375g IV q8h (each dose infused over 4 hours).  Continue Eraxis 100mg  IV daily Monitor renal function, cultures, clinical course.  Height: 5\' 2"  (157.5 cm) Weight: 119 lb 7.8 oz (54.2 kg) IBW/kg (Calculated) : 50.1  Temp (24hrs), Avg:97.8 F (36.6 C), Min:97.6 F (36.4 C), Max:98.2 F (36.8 C)  Recent Labs  Lab 02/09/19 0528 02/10/19 0351 02/11/19 0431 02/12/19 1057 02/13/19 0211 02/14/19 0209  WBC 6.2 5.9 6.6  --  5.2 5.5  CREATININE 2.01* 1.77* 1.50* 1.56* 1.27* 1.08*    Estimated Creatinine Clearance: 45.5 mL/min (A) (by C-G formula based on SCr of 1.08 mg/dL (H)).    Allergies  Allergen Reactions  . Bee Venom Swelling    Swells up with bee stings   . Metformin And Related Other (See Comments)    Upset stomach    Antimicrobials this admission: 11/22 Vanc/Cefepime/Flagyl >> Zosyn 11/23 >> 12/06 11/22 Fluconazole >> Eraxis 11/24 >> 12/14  Dose adjustments this admission:   Microbiology results: 11/22 BCx x1: 1/2 yeast (BCID + C glabrata-sens pending) 11/22 UCx: Klebsiella (R to ampicillin, I to nitro) 11/22 COVID: negative 11/23 peritoneal culture: C.albicans, C glabrata 11/23 MRSA PCR: negative 11/24 BCx x2: NG-final 11/28: Bc x2: ngtd  Thank you for allowing pharmacy to be a part of this patient's care.  Minda Ditto, PharmD 02/14/2019 12:16 PM

## 2019-02-14 NOTE — Progress Notes (Signed)
PROGRESS NOTE    Gina [name]   DZH:[id number]  DOB: [birth year]  DOA: 02/02/2019 PCP: [name], MD   Brief Narrative:  Gina [name] is a 58 y.o. 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 In the ED, she was hypothermic and severely hypotensive. Initial blood glucose >1000.   CT abdomen showed pneumoperitoneum and mesenteric ischemia. Admitted to ICU service and started on IV antibiotics.   11/23 > She underwent exploratory Laparotomy with finding of perforated prepyloric ulcer which was oversewn and 3.1 L of contaminated peritoneal fluid.   Extubated on 11/29 Hospital course complicated by DKA, AKI, acute blood loss anemia and acute thrombocytopenia.  Transferred out of ICU to [facility] on 02/10/19. General surgery following   Subjective: The patient is sleepy this morning and does not answer questions for me.  According to the nurse who had her yesterday, patient was beginning to wake up and at times appeared oriented although she continued to say "strange things" at other times.    Assessment & Plan:   Principal Problem:   Septic shock due to ruptured gastric ulcer with acute peritonitis, fungemia and UTI - ID consulted on 02/04/19 - Blood cultures from 11/22> Candida Glabrata- repeat blood culture 11/24 NGTD - Urine culture 11/22 > K pneumoniae - Peritoneal fluid 11/23> Candida albicans - Opthalmology consulted - no evidence of fungal endophthalmitis  - currently receiving Andulafungin and Zosyn - ID has recommended to remove cental line to give line holiday and right IJ was removed on 12/1 - placed PICC 12/3- Gen surgery resumed TPN and plans to clamp NG today - she is noted to have watery stool for the past few days and has been-  requiring a Flexi-Seal   Active Problems:   Acute kidney injury  - baseline Cr is < 1.0 - Cr rose to 2.66 on 11/26 and is steadily improving - foley cath removed on 11/30-urine output is adequate  History of anxiety Acute delirium - her only psych medication was Lexapro which has been on hold as she has been NPO - acute confusion, hallucinations, paranoia anxiety noted on the first day that I evaluated her (11/30) - CT head ordered and is negative - Checked UA for underlying infection (already received Zosyn)- UA shows rare bacteria but does show yeast and WBC which is not unexpected considering her current candidal infection  - B12 is not low (elevated) - Ammonia < 9 - - have asked for a psych eval-as needed Haldol recommended however we have not needed this as she is not severely agitated -Resume Lexapro once able to tolerate orals    DKA (diabetic ketoacidoses), severely uncontrolled with hyperglycemia  - resolved - A1c on 11/23>  15.2 - Uses Lantus and Novolog as outpt- currently on sliding scale insulin and NPO  Hypokalemia - replaced  Severe protein calorie malnutrition with hypoalbuminemia - NPO at this time and receiving TPN  Anemia due to acute blood loss -  s/p 1 U PRBC on 11/28 for Hb of 6.1 -  remains slightly anemic with Hb ~ 7 -  anemia panel shows low normal Iron stores, Normal folate and elevated B12 -  transfused for Hb persistently < 7  Acute thrombocytopenia - related to septic shock - nadir was > 31 on 11/27 - Platelets have risen back  to normal  Blebs and mottling on distal extremities - noted to have dusky skin with large blebs (not fluid filled) on stumps and hands-   - I am not sure how long these have been present but I noted them on the day she was transferred to our service-- per RN who was taking care of her during the time they developed, it occurred after Levophed was started (? 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  Left phalanx amputation  - on 12/28/18- Hand surgery contacted by general surgery on 11/30 to request suture removal  B/ l BKA   Time spent in minutes: 30 min  DVT prophylaxis: heparin Code Status: Full code Family Communication: daughter Thana Ramp Disposition Plan: cont to follow in SDU- her condition remains tenuous  Consultants:   General surgery  ID Procedures:   Intubation/ Extubation  Ex lap Antimicrobials:  Anti-infectives (From admission, onward)   Start     Dose/Rate Route Frequency Ordered Stop   02/05/19 1000  anidulafungin (ERAXIS) 100 mg in sodium chloride 0.9 % 100 mL IVPB     100 mg 78 mL/hr over 100 Minutes Intravenous Every 24 hours 02/04/19 0840     02/04/19 1000  anidulafungin (ERAXIS) 200 mg in sodium chloride 0.9 % 200 mL IVPB     200 mg 78 mL/hr over 200 Minutes Intravenous  Once 02/04/19 0840 02/04/19 1301   02/04/19 0200  fluconazole (DIFLUCAN) IVPB 200 mg  Status:  Discontinued     200 mg 100 mL/hr over 60 Minutes Intravenous Every 24 hours 02/02/19 2302 02/04/19 0840   02/03/19 1800  vancomycin (VANCOCIN) IVPB 750 mg/150 ml premix  Status:  Discontinued     750 mg 150 mL/hr over 60 Minutes Intravenous Every 24 hours 02/03/19 0241 02/03/19 0242   02/03/19 1800  vancomycin (VANCOCIN) IVPB 750 mg/150 ml premix  Status:  Discontinued     750 mg 150 mL/hr over 60 Minutes Intravenous Every 24 hours 02/03/19 0242 02/03/19 0858   02/03/19 0600  piperacillin-tazobactam (ZOSYN) IVPB 3.375 g     3.375 g 12.5 mL/hr over 240 Minutes Intravenous Every 8 hours 02/03/19 0243     02/02/19 2315  fluconazole (DIFLUCAN) IVPB 400 mg     400 mg 100 mL/hr over 120 Minutes Intravenous STAT 02/02/19 2301 02/03/19 0218   02/02/19  1800  ceFEPIme (MAXIPIME) 2 g in sodium chloride 0.9 % 100 mL IVPB     2 g 200 mL/hr over 30 Minutes Intravenous  Once 02/02/19 1745 02/02/19 2030   02/02/19 1800  metroNIDAZOLE (FLAGYL) IVPB 500 mg     500 mg 100 mL/hr over 60 Minutes Intravenous  Once 02/02/19 1745 02/02/19 2237   02/02/19 1800  vancomycin (VANCOCIN) IVPB 1000 mg/200 mL premix  Status:  Discontinued     1,000 mg 200 mL/hr over 60 Minutes Intravenous  Once 02/02/19 1745 02/02/19 1747   02/02/19 1800  vancomycin (VANCOCIN) 1,250 mg in sodium chloride 0.9 % 250 mL IVPB     1,250 mg 166.7 mL/hr over 90 Minutes Intravenous  Once 02/02/19 1747 02/02/19 2237       Objective: Vitals:   02/14/19 0600 02/14/19 0800 02/14/19 0900 02/14/19 1000  BP: (!) 138/57 (!) 114/46 (!) 113/45 (!) 108/45  Pulse: 91 89 87 89  Resp: _0 Temp:  98.2 F (36.8 C)    TempSrc:  Axillary    SpO2:  100% 100% 100%  Weight:      Height:        Intake/Output Summary (Last 24 hours) at 02/14/2019 1135 Last data filed at 02/14/2019 1022 Gross per 24 hour  Intake 2262.53 ml  Output 790 ml  Net 1472.53 ml   Filed Weights   02/12/19 0500 02/13/19 0500 02/14/19 0330  Weight: 61 kg 54.4 kg 54.2 kg    Examination: General exam: Appears comfortable-sleepy-will not answer questions HEENT:  no sclera icterus or thrush Respiratory system: Clear to auscultation. Respiratory effort normal. Cardiovascular system: S1 & S2 heard,  No murmurs  Gastrointestinal system: Abdomen soft, mildly tender, vertical incision noted, nondistended, poor bowel sounds-drain and  Flexi-Seal present Central nervous system: Alert and oriented. No focal neurological deficits. Extremities: Bilateral BKA Skin: see picture        Data Reviewed: I have personally reviewed following labs and imaging studies  CBC: Recent Labs  Lab 02/09/19 0528 02/10/19 0351 02/11/19 0431 02/13/19 0211 02/14/19 0209  WBC 6.2 5.9 6.6 5.2 5.5  NEUTROABS  --  4.3  --   --   3.9  HGB 7.6* 7.3* 7.2* 7.7* 7.4*  HCT 23.1* 22.8* 22.8* 25.3* 24.6*  MCV 88.2 89.8 91.6 94.4 94.3  PLT 78* 110* 161 282 270   Basic Metabolic Panel: Recent Labs  Lab 02/08/19 0409 02/09/19 0528  02/10/19 0351 02/11/19 0431 02/11/19 0644 02/12/19 1057 02/13/19 0211 02/14/19 0209  NA 129* 133*  --  136 136  --  138 140 142  K 3.1* 3.2*   < > 3.1* 3.4*  --  3.8 3.9 4.0  CL 89* 96*  --  99 103  --  106 110 111  CO2 26 26  --  25 24  --  21* 23 21*  GLUCOSE 234* 251*  --  143* 199*  --  276* 107* 123*  BUN 46* 41*  --  36* 33*  --  31* 26* 23*  CREATININE 2.38* 2.01*  --  1.77* 1.50*  --  1.56* 1.27* 1.08*  CALCIUM 7.3* 7.2*  --  7.6* 7.5*  --  7.7* 7.6* 7.7*  MG 2.3 2.1  --  2.1 2.0  --   --   --  1.8  PHOS  --   --   --  3.2  --  3.0  --   --  3.0   < > = values in this interval not displayed.   GFR: Estimated Creatinine Clearance: 45.5 mL/min (A) (by C-G formula based on SCr of 1.08 mg/dL (H)). Liver Function Tests: Recent Labs  Lab 02/10/19 0351 02/14/19 0209  AST 15 9*  ALT 19 12  ALKPHOS 57 56  BILITOT 0.6 0.6  PROT 4.1* 4.9*  ALBUMIN 1.2* 1.2*   No results for input(s): LIPASE, AMYLASE in the last 168 hours. Recent Labs  Lab 02/12/19 1419  AMMONIA <9*   Coagulation Profile: No results for input(s): INR, PROTIME in the last 168 hours. Cardiac Enzymes: No results for input(s): CKTOTAL, CKMB, CKMBINDEX, TROPONINI in the last 168 hours. BNP (last 3 results) No results for input(s): PROBNP in the last 8760 hours. HbA1C: No results for input(s): HGBA1C in the last 72 hours. CBG: Recent Labs  Lab 02/13/19 0748 02/13/19 1155 02/13/19  1728 02/13/19 2228 02/14/19 1109  GLUCAP 210* 106* 261* 142* 176*   Lipid Profile: Recent Labs    02/14/19 0209  TRIG 126   Thyroid Function Tests: No results for input(s): TSH, T4TOTAL, FREET4, T3FREE, THYROIDAB in the last 72 hours. Anemia Panel: Recent Labs    02/12/19 1419  VITAMINB12 >7,500*   Urine  analysis:    Component Value Date/Time   COLORURINE YELLOW 02/12/2019 1401   APPEARANCEUR CLOUDY (A) 02/12/2019 1401   LABSPEC 1.010 02/12/2019 1401   PHURINE 5.0 02/12/2019 1401   GLUCOSEU >=500 (A) 02/12/2019 1401   HGBUR MODERATE (A) 02/12/2019 1401   BILIRUBINUR NEGATIVE 02/12/2019 1401   BILIRUBINUR N 04/28/2016 1603   KETONESUR NEGATIVE 02/12/2019 1401   PROTEINUR 30 (A) 02/12/2019 1401   UROBILINOGEN 0.2 04/28/2016 1603   UROBILINOGEN 0.2 06/18/2013 0650   NITRITE NEGATIVE 02/12/2019 1401   LEUKOCYTESUR NEGATIVE 02/12/2019 1401   Sepsis Labs: '@LABRCNTIP'$ (procalcitonin:4,lacticidven:4) ) Recent Results (from the past 240 hour(s))  Culture, blood (routine x 2)     Status: None   Collection Time: 02/08/19 12:13 PM   Specimen: BLOOD  Result Value Ref Range Status   Specimen Description   Final    BLOOD RIGHT WRIST Performed at [facility] [address] [place], [place] [address]    Special Requests   Final    BOTTLES DRAWN AEROBIC AND ANAEROBIC Blood Culture adequate volume Performed at [facility] [address] [place], [place] [address]    Culture   Final    NO GROWTH 5 DAYS Performed at [facility], [place] [address] [place], [place] [address]    Report Status 02/13/2019 FINAL  Final  Culture, blood (routine x 2)     Status: None   Collection Time: 02/08/19 12:28 PM   Specimen: BLOOD  Result Value Ref Range Status   Specimen Description   Final    BLOOD RIGHT ARM Performed at [facility] [address], [place], [place] [address]    Special Requests   Final    BOTTLES DRAWN AEROBIC ONLY Blood Culture results may not be optimal due to an inadequate volume of blood received in culture bottles Performed at [facility] [address] [place], [place] [address]    Culture   Final    NO GROWTH 5 DAYS Performed at [facility], [place] [address], [place], [place]  [address]    Report Status 02/13/2019 FINAL  Final         Radiology Studies: Ct Head Wo Contrast  Result Date: 02/12/2019 CLINICAL DATA:  Altered mental status EXAM: CT HEAD WITHOUT CONTRAST TECHNIQUE: Contiguous axial images were obtained from the base of the skull through the vertex without intravenous contrast. COMPARISON:  02/02/2019 FINDINGS: Brain: No evidence of acute infarction, hemorrhage, hydrocephalus, extra-axial collection or mass lesion/mass effect. Vascular: No hyperdense vessel or unexpected calcification. Skull: Normal. Negative for fracture or focal lesion. Sinuses/Orbits: No acute finding. Other: None. IMPRESSION: No acute intracranial findings.  Stable exam from 02/02/2019. Electronically Signed   By: Davina Poke M.D.   On: 02/12/2019 14:59   [place] Ekg Site Rite  Result Date: 02/13/2019 If Site Rite image not attached, placement could not be confirmed due to current cardiac rhythm.     Scheduled Meds: . chlorhexidine gluconate (MEDLINE KIT)  15 mL Mouth Rinse BID  . Chlorhexidine Gluconate Cloth  6 each Topical Daily  . Gerhardt's butt cream  Topical TID  . heparin injection (subcutaneous)  5,000 Units Subcutaneous Q8H  . insulin aspart  0-15 Units Subcutaneous QID  . pantoprazole (PROTONIX) IV  40 mg Intravenous Q12H  . sodium chloride flush  10-40 mL Intracatheter Q12H   Continuous Infusions: . sodium chloride    . sodium chloride Stopped (02/13/19 2137)  . anidulafungin 100 mg (02/14/19 1023)  . piperacillin-tazobactam (ZOSYN)  IV Stopped (02/14/19 0932)  . TPN ADULT (ION) 30 mL/hr at 02/14/19 0800  . TPN ADULT (ION)       LOS: 12 days      [name], MD [facility] Pager: [email or web address] Password [email or web address] 02/14/2019, 11:35 AM

## 2019-02-14 NOTE — Progress Notes (Signed)
PHARMACY - TOTAL PARENTERAL NUTRITION CONSULT NOTE   Indication: Small bowel obstruction  Patient Measurements: Height: 5\' 2"  (157.5 cm) Weight: 119 lb 7.8 oz (54.2 kg) IBW/kg (Calculated) : 50.1 TPN AdjBW (KG): 57.9 Body mass index is 21.85 kg/m. Usual Weight: 55-60 kg  Assessment:  89 yoF with poorly controlled DM s/p BL BKA, admitted 11/23 with AMS, n/v, abdominal pain. CT abdomen showed pneumoperitoneum and mesenteric ischemia. Underwent ex lap on 11/23 with finding of perforated prepyloric ulcer which was oversewn, along with gross contamination of the peritoneum. Hospital course complicated by DKA, AKI, acute thrombocytopenia, and C. glabrata candidemia. Patient remains with high NGT d/t bowel edema as demonstrated on UGI. TPN started on 11/30 but was stopped shortly thereafter to facilitate central line holiday for candidemia. Repeat blood cultures remain negative to date, and TPN now restarting with replacement of central line. Patient at risk for refeeding given prolonged lack of nutrition.  Glucose / Insulin: Hx DM; CBGs erratic and poorly controlled on D10 W infusion (range 96-274; goal 100-150) - CBG 142 last night, total 5 units insulin after TPN started Electrolytes: wnl exc Bicarb 21; corrected Ca 9.9 Renal: AKI peaked 11/24, BUN/SCr continue to improve (baseline SCr 0.5-1.0); bicarb decreased; UOP remains borderline low; likely losing fluids from NG output LFTs / TGs: WNL (11/30), 126 (12/4) Prealbumin / albumin: both low (11/30), 5.2 (12/4) I/O, MIVF: 1/2 NS at 100 ml/hr GI Imaging: 11/30 UGI: high NG output & bowel edema Surgeries / Procedures: 11/22 ex lap: oversew of duodenal ulcer with [name] patch  Central access: PICC replaced 12/3  TPN start date: resumed 12/3 (~ 2200)  Nutritional Goals (per RD recommendation on 11/30): Kcal:  2025-2330 kcal Protein:  115-130 grams Fluid:  >/= 2 L/day Goal TPN rate is 90 mL/hr (provides 129 g of protein and 2047 kcals per  day) - GIR 3.3 mg/kg/min  Current Nutrition:  NPO  Plan: at 1800  Increase custom TPN to 50 mL/hr   Electrolytes in TPN: 18mEq/L Na, 10mEq/L K, 32mEq/L Ca, 33mEq/L Mg, and 33mmol/L of Phos; Cl:Ac ratio 1:2  Add standard MVI and trace elements to TPN  Continue moderate SSI with q6h CBG checks and adjust as needed   Reduce 1/2 NS to 50 ml/hr  Monitor TPN labs on Mon/Thurs  BMET, Mag, Phos in am  Minda Ditto PharmD Pager [phone] 02/14/2019, 9:56 AM

## 2019-02-14 NOTE — Progress Notes (Addendum)
Patient ID: Gina [name], female   DOB: 1960/07/17, 58 y.o.   MRN: [id number]    12 Days Post-Op  Subjective: Sleeping, but when awakes talks a lot of gibberish.  States she was "scared" overnight.  Unable to provide any other information currently  ROS: unable  Objective: Vital signs in last 24 hours: Temp:  [96.9 F (36.1 C)-97.7 F (36.5 C)] 97.7 F (36.5 C) (12/04 0400) Pulse Rate:  [79-96] 91 (12/04 0600) Resp:  [10-22] 11 (12/04 0600) BP: (93-149)/(38-76) 138/57 (12/04 0600) SpO2:  [96 %-100 %] 100 % (12/04 0400) Weight:  [54.2 kg] 54.2 kg (12/04 0330) Last BM Date: 02/13/19  Intake/Output from previous day: 12/03 0701 - 12/04 0700 In: 1406.1 [I.V.:1135.5; IV Piggyback:270.6] Out: 540 [Emesis/NG output:225; Drains:15; Stool:300] Intake/Output this shift: No intake/output data recorded.  PE: Gen: sleepy, but arouses and doesn't talk much sense Abd: soft, minimally tender, +BS, NGT with minimal bilious output, flexi-seal with around 500+cc of liquid stool.  Midline incision is clean and packed.  JP serous in output.  Lab Results:  Recent Labs    02/13/19 0211 02/14/19 0209  WBC 5.2 5.5  HGB 7.7* 7.4*  HCT 25.3* 24.6*  PLT 282 296   BMET Recent Labs    02/13/19 0211 02/14/19 0209  NA 140 142  K 3.9 4.0  CL 110 111  CO2 23 21*  GLUCOSE 107* 123*  BUN 26* 23*  CREATININE 1.27* 1.08*  CALCIUM 7.6* 7.7*   PT/INR No results for input(s): LABPROT, INR in the last 72 hours. CMP     Component Value Date/Time   NA 142 02/14/2019 0209   NA 139 10/04/2017 1705   K 4.0 02/14/2019 0209   CL 111 02/14/2019 0209   CO2 21 (L) 02/14/2019 0209   GLUCOSE 123 (H) 02/14/2019 0209   BUN 23 (H) 02/14/2019 0209   BUN 11 10/04/2017 1705   CREATININE 1.08 (H) 02/14/2019 0209   CREATININE 0.68 04/28/2016 1640   CALCIUM 7.7 (L) 02/14/2019 0209   PROT 4.9 (L) 02/14/2019 0209   PROT 7.1 10/04/2017 1705   ALBUMIN 1.2 (L) 02/14/2019 0209   ALBUMIN 3.7 10/04/2017 1705    AST 9 (L) 02/14/2019 0209   ALT 12 02/14/2019 0209   ALKPHOS 56 02/14/2019 0209   BILITOT 0.6 02/14/2019 0209   BILITOT <0.2 10/04/2017 1705   GFRNONAA 57 (L) 02/14/2019 0209   GFRNONAA >89 04/28/2016 1640   GFRAA >60 02/14/2019 0209   GFRAA >89 04/28/2016 1640   Lipase     Component Value Date/Time   LIPASE 27 04/01/2016 1040       Studies/Results: Ct Head Wo Contrast  Result Date: 02/12/2019 CLINICAL DATA:  Altered mental status EXAM: CT HEAD WITHOUT CONTRAST TECHNIQUE: Contiguous axial images were obtained from the base of the skull through the vertex without intravenous contrast. COMPARISON:  02/02/2019 FINDINGS: Brain: No evidence of acute infarction, hemorrhage, hydrocephalus, extra-axial collection or mass lesion/mass effect. Vascular: No hyperdense vessel or unexpected calcification. Skull: Normal. Negative for fracture or focal lesion. Sinuses/Orbits: No acute finding. Other: None. IMPRESSION: No acute intracranial findings.  Stable exam from 02/02/2019. Electronically Signed   By: Davina Poke M.D.   On: 02/12/2019 14:59   [place] Ekg Site Rite  Result Date: 02/13/2019 If Site Rite image not attached, placement could not be confirmed due to current cardiac rhythm.   Anti-infectives: Anti-infectives (From admission, onward)   Start     Dose/Rate Route Frequency Ordered Stop   02/05/19 1000  anidulafungin (ERAXIS) 100 mg in sodium chloride 0.9 % 100 mL IVPB     100 mg 78 mL/hr over 100 Minutes Intravenous Every 24 hours 02/04/19 0840     02/04/19 1000  anidulafungin (ERAXIS) 200 mg in sodium chloride 0.9 % 200 mL IVPB     200 mg 78 mL/hr over 200 Minutes Intravenous  Once 02/04/19 0840 02/04/19 1301   02/04/19 0200  fluconazole (DIFLUCAN) IVPB 200 mg  Status:  Discontinued     200 mg 100 mL/hr over 60 Minutes Intravenous Every 24 hours 02/02/19 2302 02/04/19 0840   02/03/19 1800  vancomycin (VANCOCIN) IVPB 750 mg/150 ml premix  Status:  Discontinued     750 mg 150  mL/hr over 60 Minutes Intravenous Every 24 hours 02/03/19 0241 02/03/19 0242   02/03/19 1800  vancomycin (VANCOCIN) IVPB 750 mg/150 ml premix  Status:  Discontinued     750 mg 150 mL/hr over 60 Minutes Intravenous Every 24 hours 02/03/19 0242 02/03/19 0858   02/03/19 0600  piperacillin-tazobactam (ZOSYN) IVPB 3.375 g     3.375 g 12.5 mL/hr over 240 Minutes Intravenous Every 8 hours 02/03/19 0243     02/02/19 2315  fluconazole (DIFLUCAN) IVPB 400 mg     400 mg 100 mL/hr over 120 Minutes Intravenous STAT 02/02/19 2301 02/03/19 0218   02/02/19 1800  ceFEPIme (MAXIPIME) 2 g in sodium chloride 0.9 % 100 mL IVPB     2 g 200 mL/hr over 30 Minutes Intravenous  Once 02/02/19 1745 02/02/19 2030   02/02/19 1800  metroNIDAZOLE (FLAGYL) IVPB 500 mg     500 mg 100 mL/hr over 60 Minutes Intravenous  Once 02/02/19 1745 02/02/19 2237   02/02/19 1800  vancomycin (VANCOCIN) IVPB 1000 mg/200 mL premix  Status:  Discontinued     1,000 mg 200 mL/hr over 60 Minutes Intravenous  Once 02/02/19 1745 02/02/19 1747   02/02/19 1800  vancomycin (VANCOCIN) 1,250 mg in sodium chloride 0.9 % 250 mL IVPB     1,250 mg 166.7 mL/hr over 90 Minutes Intravenous  Once 02/02/19 1747 02/02/19 2237       Assessment/Plan VDRF- extubated 11/29 Poorly controlled DM2- A1c15.2 S/p bilateral BKA Ischemic changes to both LE- defer to medicine, stable Acute onCKD  HTN Schizophrenia DKA Leukopenia Severe malnutrition, Prealbumin <5 on 11/30 Anemia  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 Lucia Gaskins, for perforated prepyloric ulcer with gross contamination - BID wet to dry dressing changes to midline abdominal wound -pulm toilet and IS -OOB to chair as much as possible -mobilize, PT/OT -NGT output has  significantly decreased.  Will clamp NGT today.  If tolerates today, can either DC tomorrow and let try CLD vs pending mental status TFs. -cont TNA for now for nutritional support as it is doubtful she is going to fly with eat solid foods right away, once able.  ID - Eraxis 11/24>>Zosyn 11/23 >> 14 days FEN - NPO/NGT clamped VTE - NoSCDs,Bilateral BKa's - sq heparin  Foley -out Follow up -Dr. Lucia Gaskins   LOS: 12 days    Henreitta Cea , [facility] Surgery 02/14/2019, 8:17 AM Please see Amion for pager number during day hours 7:00am-4:30pm  Agree with above. Hopefully she is "opening up". For trial NGT clamping.  Alphonsa Overall, MD, [facility] Surgery Office phone:  [phone]

## 2019-02-14 NOTE — Progress Notes (Signed)
[facility]    Date of Admission:  02/02/2019      ID: Gina [name] is a 58 y.o. 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 chlorhexidine gluconate (MEDLINE KIT)  15 mL Mouth Rinse BID  . Chlorhexidine Gluconate Cloth  6 each Topical Daily  . Gerhardt's butt cream   Topical TID  . heparin injection (subcutaneous)  5,000 Units Subcutaneous Q8H  . insulin aspart  0-15 Units Subcutaneous QID  . pantoprazole (PROTONIX) IV  40 mg Intravenous Q12H  . sodium chloride flush  10-40 mL Intracatheter Q12H    Objective: Vital signs in last 24 hours: Temp:  [97.6 F (36.4 C)-98.2 F (36.8 C)] 97.9 F (36.6 C) (12/04 1200) Pulse Rate:  [79-96] 91 (12/04 1200) Resp:  [10-22] 18 (12/04 1200) BP: (93-149)/(38-76) 108/50 (12/04 1200) SpO2:  [100 %] 100 % (12/04 1200) Weight:  [54.2 kg] 54.2 kg (12/04 0330) Physical Exam  Constitutional:  oriented to person, only. appears chronically ill and well-nourished. No distress.  HENT: [place]/AT, PERRLA, no scleral icterus Mouth/Throat: Oropharynx is clear and moist. No oropharyngeal exudate.  Cardiovascular: Normal rate, regular rhythm and normal heart sounds. Exam reveals no gallop and no friction rub.  No murmur heard.  Pulmonary/Chest: Effort normal and breath sounds normal. No respiratory distress.  has no wheezes.  Neck = supple, no nuchal rigidity Abdominal: Soft. Bowel sounds are decreased. Midline incision is c/d/i. Rectal  tube in place.  exhibits no distension. There is no tenderness.  Ext: some desquamation to affected skin bullae associated with sepsis to LE bilaterally Psychiatric: flat affect   Lab Results Recent Labs    02/13/19 0211 02/14/19 0209  WBC 5.2 5.5  HGB 7.7* 7.4*  HCT 25.3* 24.6*  NA 140 142  K 3.9 4.0  CL 110 111  CO2 23 21*  BUN 26* 23*  CREATININE 1.27* 1.08*   Liver Panel Recent Labs    02/14/19 0209  PROT 4.9*  ALBUMIN 1.2*  AST 9*  ALT 12  ALKPHOS 56  BILITOT 0.6   Microbiology: reviewed Studies/Results: [place] Ekg Site Rite  Result Date: 02/13/2019 If Site Rite image not attached, placement could not be confirmed due to current cardiac rhythm.    Assessment/Plan: Plans is unchanged except  Pressure wound = need to apply santyl to wound bed for debridement to get to healthy tissue for healing  Fungemia = use 12/1 as day 1 of treatment x 14 days with anidulafungin. She will need to be on through 12/14  Intra-abdominal infection s/p drain = currently on day #11. Would give 3 more days to complete 14 day course of piptazo. Then monitor off off of abtx  Severe malnutrition = will restart TNA. Still having significant output from NG, awaiting to see if obstruction can decrease so that TF can be initiated.  [facility] Cell: [phone] Pager: [phone]  02/14/2019, 3:07 PM

## 2019-02-15 LAB — GLUCOSE, CAPILLARY
Glucose-Capillary: 174 mg/dL — ABNORMAL HIGH (ref 70–99)
Glucose-Capillary: 270 mg/dL — ABNORMAL HIGH (ref 70–99)
Glucose-Capillary: 272 mg/dL — ABNORMAL HIGH (ref 70–99)
Glucose-Capillary: 274 mg/dL — ABNORMAL HIGH (ref 70–99)
Glucose-Capillary: 309 mg/dL — ABNORMAL HIGH (ref 70–99)
Glucose-Capillary: 325 mg/dL — ABNORMAL HIGH (ref 70–99)

## 2019-02-15 LAB — BASIC METABOLIC PANEL
Anion gap: 7 (ref 5–15)
BUN: 24 mg/dL — ABNORMAL HIGH (ref 6–20)
CO2: 21 mmol/L — ABNORMAL LOW (ref 22–32)
Calcium: 7.4 mg/dL — ABNORMAL LOW (ref 8.9–10.3)
Chloride: 111 mmol/L (ref 98–111)
Creatinine, Ser: 1.22 mg/dL — ABNORMAL HIGH (ref 0.44–1.00)
GFR calc Af Amer: 57 mL/min — ABNORMAL LOW (ref >60)
GFR calc non Af Amer: 49 mL/min — ABNORMAL LOW (ref >60)
Glucose, Bld: 200 mg/dL — ABNORMAL HIGH (ref 70–99)
Potassium: 4.1 mmol/L (ref 3.5–5.1)
Sodium: 139 mmol/L (ref 135–145)

## 2019-02-15 LAB — MAGNESIUM: Magnesium: 1.8 mg/dL (ref 1.7–2.4)

## 2019-02-15 LAB — PHOSPHORUS: Phosphorus: 2.8 mg/dL (ref 2.5–4.6)

## 2019-02-15 MED ORDER — TRAVASOL 10 % IV SOLN
INTRAVENOUS | Status: AC
  Administered 2019-02-15: 17:00:00 via INTRAVENOUS
  Filled 2019-02-15: qty 720

## 2019-02-15 MED ORDER — INSULIN ASPART 100 UNIT/ML ~~LOC~~ SOLN
0.0000 [IU] | Freq: Four times a day (QID) | SUBCUTANEOUS | Status: DC
  Administered 2019-02-15: 8 [IU] via SUBCUTANEOUS
  Administered 2019-02-15 – 2019-02-16 (×2): 11 [IU] via SUBCUTANEOUS
  Administered 2019-02-16: 8 [IU] via SUBCUTANEOUS
  Administered 2019-02-16: 5 [IU] via SUBCUTANEOUS
  Administered 2019-02-17: 3 [IU] via SUBCUTANEOUS
  Administered 2019-02-17: 5 [IU] via SUBCUTANEOUS

## 2019-02-15 NOTE — Progress Notes (Signed)
PHARMACY - TOTAL PARENTERAL NUTRITION CONSULT NOTE   Indication: Small bowel obstruction  Patient Measurements: Height: 5\' 2"  (157.5 cm) Weight: 126 lb 12.2 oz (57.5 kg) IBW/kg (Calculated) : 50.1 TPN AdjBW (KG): 57.9 Body mass index is 23.19 kg/m. Usual Weight: 55-60 kg  Assessment:  65 yoF with poorly controlled DM s/p BL BKA, admitted 11/23 with AMS, n/v, abdominal pain. CT abdomen showed pneumoperitoneum and mesenteric ischemia. Underwent ex lap on 11/23 with finding of perforated prepyloric ulcer which was oversewn, along with gross contamination of the peritoneum. Hospital course complicated by DKA, AKI, acute thrombocytopenia, and C. glabrata candidemia. Patient remains with high NGT d/t bowel edema as demonstrated on UGI. TPN started on 11/30 but was stopped shortly thereafter to facilitate central line holiday for candidemia. Repeat blood cultures remain negative to date, and TPN now restarting with replacement of central line. Patient at risk for refeeding given prolonged lack of nutrition.  Glucose / Insulin: Hx DM; prev required D10 W infusion to maintain euglycemia Current moderate Novolog scale - scheduled during the day only - changed to q6h - serum glucose 200 this am - last insulin 9pm 12/4. Will consider add insulin based on CBGs today/tonight  Electrolytes: wnl exc Bicarb remains 21; corrected Ca 9.9 Renal: AKI on admit, SCr 1.22 today LFTs / TGs: WNL (11/30), 126 (12/4) Prealbumin / albumin: both low (11/30), 5.2 (12/4) I/O, MIVF: Variable NG output, 1/2 NS at 50 ml/hr GI Imaging: 11/30 UGI: high NG output & bowel edema Surgeries / Procedures: 11/22 ex lap: oversew of duodenal ulcer with [name] patch  Central access: PICC replaced 12/3  TPN start date: resumed 12/3 (~ 2200)  Nutritional Goals (per RD recommendation on 11/30): Kcal:  2025-2330 kcal Protein:  115-130 grams Fluid:  >/= 2 L/day Goal TPN rate is 90 mL/hr (provides 129 g of protein and 2047 kcals per  day) - GIR 3.3 mg/kg/min  Current Nutrition:  NPO  Plan: at 1800  Continue custom TPN at 50 mL/hr   Electrolytes in TPN: 34mEq/L Na, 101mEq/L K, 59mEq/L Ca, 57mEq/L Mg, and 62mmol/L of Phos; Cl:Ac ratio 1:2  Add standard MVI and trace elements to TPN  Continue moderate SSI with q6h CBG checks and adjust as needed   Reduce 1/2 NS to 50 ml/hr  Monitor TPN labs on Mon/Thurs  BMET, Mag, Phos in am  Minda Ditto PharmD Pager [phone] 02/15/2019, 12:17 PM

## 2019-02-15 NOTE — Progress Notes (Signed)
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  Patient was admitted with altered mental status, nausea, vomiting and abdominal pain. In the ED, she was hypothermic and severely hypotensive. Initial blood glucose >1000.   CT abdomen showed pneumoperitoneum and mesenteric ischemia. Admitted to ICU service and started on IV antibiotics.   11/23 > She underwent exploratory Laparotomy with finding of perforated prepyloric ulcer which was oversewn and 3.1 L of contaminated peritoneal fluid.   Extubated on 11/29 Hospital course complicated by DKA, AKI, acute blood loss anemia and acute thrombocytopenia.  Transferred out of ICU to [facility] on 02/10/19. General surgery following.  02/15/2019: NG tube has been pulled.  Patient is currently on ice chips.  Parenteral nutrition is continuous.  Will await speech evaluation.  Patient is a poor historian, and cannot give any significant history..  Subjective: Patient is a poor historian.  Patient cannot give any significant history.  Assessment & Plan:   Principal Problem:   Septic shock due to ruptured gastric ulcer with acute peritonitis, fungemia and UTI - ID consulted on 02/04/19 - Blood cultures from 11/22> Candida Glabrata- repeat blood culture 11/24 NGTD - Urine culture 11/22 > K pneumoniae - Peritoneal fluid 11/23> Candida albicans - Opthalmology consulted - no evidence of fungal endophthalmitis  - currently receiving Andulafungin and Zosyn - ID has recommended to remove cental line to give line holiday and right IJ was removed on 12/1 - placed PICC 12/3- Gen surgery resumed TPN and plans to clamp NG today - she  is noted to have watery stool for the past few days and has been-  requiring a Flexi-Seal -02/15/2019: Sepsis physiology has resolved significantly.  Infectious disease team is managing fungemia and intra-abdominal infection with anidulafungin (day 5 of 14) and Zosyn (day 12 of 14).  Active Problems:   Acute kidney injury  - baseline Cr is < 1.0 - Cr rose to 2.66 on 11/26 and is steadily improving - foley cath removed on 11/30-urine output is adequate 02/15/2019: Serum creatinine today is 1.22.  AKI is slowly resolving.  Etiology is likely multifactorial.  History of anxiety Acute delirium - her only psych medication was Lexapro which has been on hold as she has been NPO - acute confusion, hallucinations, paranoia anxiety noted on the first day that I evaluated her (11/30) - CT head ordered and is negative - Checked UA for underlying infection (already received Zosyn)- UA shows rare bacteria but does show yeast and WBC which is not unexpected considering her current candidal infection  - B12 is not low (elevated) - Ammonia < 9 - - have asked for a psych eval-as needed Haldol recommended however we have not needed this as she is not severely agitated -Resume Lexapro once able to tolerate orals 02/15/2019: Continue to manage expectantly.    DKA (diabetic ketoacidoses), severely uncontrolled with hyperglycemia  - resolved - A1c on 11/23>  15.2 - Uses Lantus and Novolog as outpt- currently on sliding scale insulin and NPO 03/07/2019: DKA has resolved.  Continue to optimize blood sugar control.  Hypokalemia - replaced 02/15/2019: Potassium is 4.1 today.  Severe protein calorie malnutrition with hypoalbuminemia - NPO at this time and receiving TPN  02/15/2019: Patient still on TPN.  NG tube has been discontinued.  Currently on ice chips.  Awaiting speech evaluation.  Will likely advance oral intake as tolerated with time  Anemia due to acute blood loss -  s/p 1 U PRBC on 11/28 for Hb of 6.1  -  remains slightly anemic with Hb ~ 7 -  anemia panel shows low normal Iron stores, Normal folate and elevated B12 -  transfused for Hb persistently < 7 02/15/2019: Repeat CBC in the morning.  Acute thrombocytopenia - related to septic shock - nadir was > 31 on 11/27 - Platelets have risen back to normal  Blebs and mottling on distal extremities - noted to have dusky skin with large blebs (not fluid filled) on stumps and hands-   - I am not sure how long these have been present but I noted them on the day she was transferred to our service-- per RN who was taking care of her during the time they developed, it occurred after Levophed was started (? If she had ischemia from vasoconstriction) - extremities are warm and appear to be perfusing well - blebs do not appear to be progressing and actually are resolving but skin breakdown is occurring- local wound care being done- Wound care RN also consulted to give input 02/15/2019: We will have a low threshold to consult vascular surgery team.  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 Consultants:   General surgery  ID Procedures:   Intubation/ Extubation  Ex lap Antimicrobials:  Anti-infectives (From admission, onward)   Start     Dose/Rate Route Frequency Ordered Stop   02/05/19 1000  anidulafungin (ERAXIS) 100 mg in sodium chloride 0.9 % 100 mL IVPB     100 mg 78 mL/hr over 100 Minutes Intravenous Every 24 hours 02/04/19 0840     02/04/19 1000  anidulafungin (ERAXIS) 200 mg in sodium chloride 0.9 % 200 mL IVPB     200 mg 78 mL/hr over 200 Minutes Intravenous  Once 02/04/19 0840 02/04/19 1301   02/04/19 0200  fluconazole (DIFLUCAN) IVPB  200 mg  Status:  Discontinued     200 mg 100 mL/hr over 60 Minutes Intravenous Every 24 hours 02/02/19 2302 02/04/19 0840   02/03/19 1800  vancomycin (VANCOCIN) IVPB 750 mg/150 ml premix  Status:  Discontinued     750 mg 150 mL/hr over 60 Minutes Intravenous Every 24 hours 02/03/19 0241 02/03/19 0242   02/03/19 1800  vancomycin (VANCOCIN) IVPB 750 mg/150 ml premix  Status:  Discontinued     750 mg 150 mL/hr over 60 Minutes Intravenous Every 24 hours 02/03/19 0242 02/03/19 0858   02/03/19 0600  piperacillin-tazobactam (ZOSYN) IVPB 3.375 g     3.375 g 12.5 mL/hr over 240 Minutes Intravenous Every 8 hours 02/03/19 0243     02/02/19 2315  fluconazole (DIFLUCAN) IVPB 400 mg     400 mg 100 mL/hr over 120 Minutes Intravenous STAT 02/02/19 2301 02/03/19 0218   02/02/19 1800  ceFEPIme (MAXIPIME) 2 g in sodium chloride 0.9 % 100 mL IVPB     2 g 200 mL/hr over 30 Minutes Intravenous  Once 02/02/19 1745 02/02/19 2030   02/02/19 1800  metroNIDAZOLE (FLAGYL) IVPB 500 mg     500 mg 100 mL/hr over 60 Minutes Intravenous  Once 02/02/19 1745 02/02/19 2237   02/02/19 1800  vancomycin (VANCOCIN) IVPB 1000 mg/200 mL premix  Status:  Discontinued     1,000 mg 200 mL/hr over 60 Minutes Intravenous  Once 02/02/19 1745 02/02/19 1747   02/02/19 1800  vancomycin (VANCOCIN) 1,250 mg in sodium chloride 0.9 % 250 mL IVPB     1,250 mg 166.7 mL/hr over 90 Minutes Intravenous  Once 02/02/19 1747 02/02/19 2237       Objective: Vitals:   02/15/19 0630 02/15/19 0645 02/15/19 0700 02/15/19 0800  BP:   (!) 112/49 (!) 111/48  Pulse: (!) 107 (!) 109 (!) 109 (!) 109  Resp: 15 15 16  (!) 22  Temp:    98.2 F (36.8 C)  TempSrc:    Axillary  SpO2:    100%  Weight:      Height:        Intake/Output Summary (Last 24 hours) at 02/15/2019 1025 Last data filed at 02/15/2019 1019 Gross per 24 hour  Intake 864.21 ml  Output 310 ml  Net 554.21 ml   Filed Weights   02/13/19 0500 02/14/19 0330 02/15/19 0540  Weight:  54.4 kg 54.2 kg 57.5 kg    Examination: General exam: Appears comfortable.  Not in any distress. HEENT:  [place].  No jaundice Respiratory system: Clear to auscultation. Respiratory effort normal. Cardiovascular system: S1 & S2, systolic murmur. Gastrointestinal system: Abdomen soft, mildly tender, vertical incision noted, nondistended, poor bowel sounds-drain and  Flexi-Seal present Central nervous system:  Awake and alert.  Patient moves all extremities.   Extremities: Bilateral BKA Skin: see picture        Data Reviewed: I have personally reviewed following labs and imaging studies  CBC: Recent Labs  Lab 02/09/19 0528 02/10/19 0351 02/11/19 0431 02/13/19 0211 02/14/19 0209  WBC 6.2 5.9 6.6 5.2 5.5  NEUTROABS  --  4.3  --   --  3.9  HGB 7.6* 7.3* 7.2* 7.7* 7.4*  HCT 23.1* 22.8* 22.8* 25.3* 24.6*  MCV 88.2 89.8 91.6 94.4 94.3  PLT 78* 110* 161 282 720   Basic Metabolic Panel: Recent Labs  Lab 02/09/19 0528  02/10/19 0351 02/11/19 0431 02/11/19 0644 02/12/19 1057 02/13/19 0211 02/14/19 0209 02/15/19 0158  NA 133*  --  136 136  --  138 140 142 139  K 3.2*   < > 3.1* 3.4*  --  3.8 3.9 4.0 4.1  CL 96*  --  99 103  --  106 110 111 111  CO2 26  --  25 24  --  21* 23 21* 21*  GLUCOSE 251*  --  143* 199*  --  276* 107* 123* 200*  BUN 41*  --  36* 33*  --  31* 26* 23* 24*  CREATININE 2.01*  --  1.77* 1.50*  --  1.56* 1.27* 1.08* 1.22*  CALCIUM 7.2*  --  7.6* 7.5*  --  7.7* 7.6* 7.7* 7.4*  MG 2.1  --  2.1 2.0  --   --   --  1.8 1.8  PHOS  --   --  3.2  --  3.0  --   --  3.0 2.8   < > = values in this interval not displayed.   GFR: Estimated Creatinine Clearance: 40.2 mL/min (A) (by C-G formula based on SCr of 1.22 mg/dL (H)). Liver Function Tests: Recent Labs  Lab 02/10/19  5329 02/14/19 0209  AST 15 9*  ALT 19 12  ALKPHOS 57 56  BILITOT 0.6 0.6  PROT 4.1* 4.9*  ALBUMIN 1.2* 1.2*   No results for input(s): LIPASE, AMYLASE in the last 168 hours. Recent  Labs  Lab 02/12/19 1419  AMMONIA <9*   Coagulation Profile: No results for input(s): INR, PROTIME in the last 168 hours. Cardiac Enzymes: No results for input(s): CKTOTAL, CKMB, CKMBINDEX, TROPONINI in the last 168 hours. BNP (last 3 results) No results for input(s): PROBNP in the last 8760 hours. HbA1C: No results for input(s): HGBA1C in the last 72 hours. CBG: Recent Labs  Lab 02/13/19 2228 02/14/19 0740 02/14/19 1109 02/14/19 1622 02/14/19 2142  GLUCAP 142* 199* 176* 194* 174*   Lipid Profile: Recent Labs    02/14/19 0209  TRIG 126   Thyroid Function Tests: No results for input(s): TSH, T4TOTAL, FREET4, T3FREE, THYROIDAB in the last 72 hours. Anemia Panel: Recent Labs    02/12/19 1419  VITAMINB12 >7,500*   Urine analysis:    Component Value Date/Time   COLORURINE YELLOW 02/12/2019 1401   APPEARANCEUR CLOUDY (A) 02/12/2019 1401   LABSPEC 1.010 02/12/2019 1401   PHURINE 5.0 02/12/2019 1401   GLUCOSEU >=500 (A) 02/12/2019 1401   HGBUR MODERATE (A) 02/12/2019 1401   BILIRUBINUR NEGATIVE 02/12/2019 1401   BILIRUBINUR N 04/28/2016 1603   KETONESUR NEGATIVE 02/12/2019 1401   PROTEINUR 30 (A) 02/12/2019 1401   UROBILINOGEN 0.2 04/28/2016 1603   UROBILINOGEN 0.2 06/18/2013 0650   NITRITE NEGATIVE 02/12/2019 1401   LEUKOCYTESUR NEGATIVE 02/12/2019 1401   Sepsis Labs: @LABRCNTIP (procalcitonin:4,lacticidven:4) ) Recent Results (from the past 240 hour(s))  Culture, blood (routine x 2)     Status: None   Collection Time: 02/08/19 12:13 PM   Specimen: BLOOD  Result Value Ref Range Status   Specimen Description   Final    BLOOD RIGHT WRIST Performed at [facility] [address], [place], [place] [address]    Special Requests   Final    BOTTLES DRAWN AEROBIC AND ANAEROBIC Blood Culture adequate volume Performed at [facility] [address] [place], [place] [address]    Culture   Final    NO GROWTH 5 DAYS Performed  at [facility], [place] [address] [place], [place] [address]    Report Status 02/13/2019 FINAL  Final  Culture, blood (routine x 2)     Status: None   Collection Time: 02/08/19 12:28 PM   Specimen: BLOOD  Result Value Ref Range Status   Specimen Description   Final    BLOOD RIGHT ARM Performed at [facility] [address] [place], [place] [address]    Special Requests   Final    BOTTLES DRAWN AEROBIC ONLY Blood Culture results may not be optimal due to an inadequate volume of blood received in culture bottles Performed at [facility] [address] [place], [place] [address]    Culture   Final    NO GROWTH 5 DAYS Performed at [facility], [place] [address], [place], [place] [address]    Report Status 02/13/2019 FINAL  Final         Radiology Studies: No results found.    Scheduled Meds: . chlorhexidine gluconate (MEDLINE KIT)  15 mL Mouth Rinse BID  . Chlorhexidine Gluconate Cloth  6 each Topical Daily  . Gerhardt's butt cream   Topical TID  . heparin injection (subcutaneous)  5,000  Units Subcutaneous Q8H  . insulin aspart  0-15 Units Subcutaneous Q6H  . pantoprazole (PROTONIX) IV  40 mg Intravenous Q12H  . sodium chloride flush  10-40 mL Intracatheter Q12H   Continuous Infusions: . sodium chloride 50 mL/hr at 02/14/19 2213  . sodium chloride Stopped (02/13/19 2137)  . anidulafungin 100 mg (02/15/19 1021)  . piperacillin-tazobactam (ZOSYN)  IV Stopped (02/15/19 0939)  . TPN ADULT (ION) 50 mL/hr at 02/14/19 2213     LOS: 13 days      Bonnell Public, MD [facility] Pager: [email or web address] Password [email or web address] 02/15/2019, 10:25 AM

## 2019-02-15 NOTE — Progress Notes (Addendum)
Patient ID: Gina [name], female   DOB: 06/17/1960, 58 y.o.   MRN: [id number]    13 Days Post-Op  Subjective: Awake, talking.  Denies complaints  Objective: Vital signs in last 24 hours: Temp:  [97.6 F (36.4 C)-101.2 F (38.4 C)] 98.2 F (36.8 C) (12/05 0800) Pulse Rate:  [87-109] 109 (12/05 0645) Resp:  [13-25] 15 (12/05 0645) BP: (90-129)/(36-67) 121/60 (12/05 0600) SpO2:  [100 %] 100 % (12/04 1200) Weight:  [57.5 kg] 57.5 kg (12/05 0540) Last BM Date: 02/14/19  Intake/Output from previous day: 12/04 0701 - 12/05 0700 In: 1710.7 [I.V.:1504.5; IV Piggyback:206.2] Out: 560 [Urine:200; Emesis/NG output:250; Drains:10; Stool:100] Intake/Output this shift: No intake/output data recorded.  PE: Gen: sleepy, but arouses and doesn't talk much sense Abd: soft, minimally tender, NGT with minimal bilious output currently clamped, flexi-seal.  Midline incision is clean and packed.  JP serous in output.  Lab Results:  Recent Labs    02/13/19 0211 02/14/19 0209  WBC 5.2 5.5  HGB 7.7* 7.4*  HCT 25.3* 24.6*  PLT 282 296   BMET Recent Labs    02/14/19 0209 02/15/19 0158  NA 142 139  K 4.0 4.1  CL 111 111  CO2 21* 21*  GLUCOSE 123* 200*  BUN 23* 24*  CREATININE 1.08* 1.22*  CALCIUM 7.7* 7.4*   PT/INR No results for input(s): LABPROT, INR in the last 72 hours. CMP     Component Value Date/Time   NA 139 02/15/2019 0158   NA 139 10/04/2017 1705   K 4.1 02/15/2019 0158   CL 111 02/15/2019 0158   CO2 21 (L) 02/15/2019 0158   GLUCOSE 200 (H) 02/15/2019 0158   BUN 24 (H) 02/15/2019 0158   BUN 11 10/04/2017 1705   CREATININE 1.22 (H) 02/15/2019 0158   CREATININE 0.68 04/28/2016 1640   CALCIUM 7.4 (L) 02/15/2019 0158   PROT 4.9 (L) 02/14/2019 0209   PROT 7.1 10/04/2017 1705   ALBUMIN 1.2 (L) 02/14/2019 0209   ALBUMIN 3.7 10/04/2017 1705   AST 9 (L) 02/14/2019 0209   ALT 12 02/14/2019 0209   ALKPHOS 56 02/14/2019 0209   BILITOT 0.6 02/14/2019 0209   BILITOT <0.2  10/04/2017 1705   GFRNONAA 49 (L) 02/15/2019 0158   GFRNONAA >89 04/28/2016 1640   GFRAA 57 (L) 02/15/2019 0158   GFRAA >89 04/28/2016 1640   Lipase     Component Value Date/Time   LIPASE 27 04/01/2016 1040       Studies/Results: [place] Ekg Site Rite  Result Date: 02/13/2019 If Site Rite image not attached, placement could not be confirmed due to current cardiac rhythm.   Anti-infectives: Anti-infectives (From admission, onward)   Start     Dose/Rate Route Frequency Ordered Stop   02/05/19 1000  anidulafungin (ERAXIS) 100 mg in sodium chloride 0.9 % 100 mL IVPB     100 mg 78 mL/hr over 100 Minutes Intravenous Every 24 hours 02/04/19 0840     02/04/19 1000  anidulafungin (ERAXIS) 200 mg in sodium chloride 0.9 % 200 mL IVPB     200 mg 78 mL/hr over 200 Minutes Intravenous  Once 02/04/19 0840 02/04/19 1301   02/04/19 0200  fluconazole (DIFLUCAN) IVPB 200 mg  Status:  Discontinued     200 mg 100 mL/hr over 60 Minutes Intravenous Every 24 hours 02/02/19 2302 02/04/19 0840   02/03/19 1800  vancomycin (VANCOCIN) IVPB 750 mg/150 ml premix  Status:  Discontinued     750 mg 150 mL/hr over 60 Minutes  Intravenous Every 24 hours 02/03/19 0241 02/03/19 0242   02/03/19 1800  vancomycin (VANCOCIN) IVPB 750 mg/150 ml premix  Status:  Discontinued     750 mg 150 mL/hr over 60 Minutes Intravenous Every 24 hours 02/03/19 0242 02/03/19 0858   02/03/19 0600  piperacillin-tazobactam (ZOSYN) IVPB 3.375 g     3.375 g 12.5 mL/hr over 240 Minutes Intravenous Every 8 hours 02/03/19 0243     02/02/19 2315  fluconazole (DIFLUCAN) IVPB 400 mg     400 mg 100 mL/hr over 120 Minutes Intravenous STAT 02/02/19 2301 02/03/19 0218   02/02/19 1800  ceFEPIme (MAXIPIME) 2 g in sodium chloride 0.9 % 100 mL IVPB     2 g 200 mL/hr over 30 Minutes Intravenous  Once 02/02/19 1745 02/02/19 2030   02/02/19 1800  metroNIDAZOLE (FLAGYL) IVPB 500 mg     500 mg 100 mL/hr over 60 Minutes Intravenous  Once 02/02/19 1745  02/02/19 2237   02/02/19 1800  vancomycin (VANCOCIN) IVPB 1000 mg/200 mL premix  Status:  Discontinued     1,000 mg 200 mL/hr over 60 Minutes Intravenous  Once 02/02/19 1745 02/02/19 1747   02/02/19 1800  vancomycin (VANCOCIN) 1,250 mg in sodium chloride 0.9 % 250 mL IVPB     1,250 mg 166.7 mL/hr over 90 Minutes Intravenous  Once 02/02/19 1747 02/02/19 2237       Assessment/Plan VDRF- extubated 11/29 Poorly controlled DM2- A1c15.2 S/p bilateral BKA Ischemic changes to both LE- defer to medicine, stable Acute onCKD  HTN Schizophrenia DKA Leukopenia Severe malnutrition, Prealbumin <5 on 11/30 Anemia  3rd finger amputationwith stiches in place 12/28/18 DrFred Apolonio Schneiders  - above per medicine  AMS- CT head negative Candida glabratabacteremia-per ID, on antifungal  POD13,S/pex lap with oversew of duodenal ulcer,with Graham patch 11/23,Dr. Lucia Gaskins, for perforated prepyloric ulcer with gross contamination - BID wet to dry dressing changes to midline abdominal wound -pulm toilet and IS -OOB to chair as much as possible -mobilize, PT/OT -NGT output has significantly decreased. Passed claming trial - remove today, needs swallow eval/speech before giving liquids. -cont TNA for now for nutritional support as it is doubtful she is going to fly with eat solid foods right away, once able.  ID - Eraxis 11/24>>Zosyn 11/23 >> 14 days FEN - NPO/NGT clamped - d/c today; speech eval prior to clear liquids VTE - NoSCDs,Bilateral BKa's - sq heparin  Foley -out Follow up -Dr. Lucia Gaskins   LOS: 33 days   Nadeen Landau, M.D. [facility] Use [email or web address] to contact on call provider

## 2019-02-15 NOTE — Evaluation (Signed)
Clinical/Bedside Swallow Evaluation Patient Details  Name: Gina [name] MRN: [id number] Date of Birth: 10/23/1960  Today's Date: 02/15/2019 Time: SLP Start Time (ACUTE ONLY): [id number] SLP Stop Time (ACUTE ONLY): 1307 SLP Time Calculation (min) (ACUTE ONLY): 22 min  Past Medical History:  Past Medical History:  Diagnosis Date  . Diabetes mellitus   . Hypertension   . Osteomyelitis of right foot ([place]) 02/22/2017  . Schizophrenia ([place])   . Septic arthritis of interphalangeal joint of toe of left foot ([place]) 06/02/2016  . Stress incontinence 04/26/2017   Past Surgical History:  Past Surgical History:  Procedure Laterality Date  . AMPUTATION Left 06/05/2016   Procedure: LEFT FOOT TRANSMETATARSAL AMPUTATION;  Surgeon: [name], MD;  Location: [facility];  Service: Orthopedics;  Laterality: Left;  . AMPUTATION Right 11/11/2017   Procedure: AMPUTATION BELOW KNEE;  Surgeon: [name], MD;  Location: [place];  Service: Orthopedics;  Laterality: Right;  . AMPUTATION Left 05/10/2018   Procedure: LEFT BELOW KNEE AMPUTATION;  Surgeon: [name], MD;  Location: [place];  Service: Orthopedics;  Laterality: Left;  . AMPUTATION Left 12/28/2018   Procedure: AMPUTATION THIRD DIGIT;  Surgeon: Iran Planas, MD;  Location: [facility];  Service: Orthopedics;  Laterality: Left;  . AMPUTATION TOE Right 02/23/2017   Procedure: AMPUTATION RIGHT THIRD TOE;  Surgeon: [name], MD;  Location: [place];  Service: Orthopedics;  Laterality: Right;  . I&D EXTREMITY Right 11/09/2017   Procedure: Debride Ulcer Right Heel;  Surgeon: [name], MD;  Location: [place];  Service: Orthopedics;  Laterality: Right;  . I&D EXTREMITY Left 12/28/2018   Procedure: IRRIGATION AND DEBRIDEMENT EXTREMITY;  Surgeon: Iran Planas, MD;  Location: [facility];  Service: Orthopedics;  Laterality: Left;  . INCISION AND DRAINAGE OF WOUND Left 12/26/2018   Procedure: IRRIGATION AND DEBRIDEMENT LEFT HAND;  Surgeon: Iran Planas, MD;  Location:  [facility];  Service: Orthopedics;  Laterality: Left;  . LAPAROTOMY N/A 02/02/2019   Procedure: EXPLORATORY LAPAROTOMY WITH OVERSEW OF DUODENAL ULCER;  Surgeon: Alphonsa Overall, MD;  Location: [facility];  Service: General;  Laterality: N/A;  . TUBAL LIGATION     HPI:  58 y.o. female with h/o poorly controlled DM2 with diabetic foot wounds s/p b/l BKA, recent left phalanx amputation, HTN and schizophrenia who presented to the ED via EMS with altered mental status, nausea, vomiting and abdominal pain on 02/02/19. She was accompanied by her daughter who provided history as the patient is unable to do so. Per report, the patient has been complaining of "flu-like symptoms" for the past few days. Endorses fever, chills, N/V, abdominal pain. She lives alone but has a caretaker who lives nearby. She was found on the floor on 02/02/19 very altered; pt intubated from 02/03/19-02/09/19.  BSE generated to assess swallow function; CT head on 02/12/19 negative for acute injury; 11/23 > She underwent exploratory Laparotomy with finding of perforated prepyloric ulcer which was oversewn and 3.1 L of contaminated peritoneal fluid  Assessment / Plan / Recommendation Clinical Impression  Pt given ice chips/thin liquids only d/t recent abdominal surgery d/t perforated prepyloric ulcer/recent nausea/vomiting with intake and after aggressive oral care provided; Pt with severe xerostomia prior to oral care; pt demonstrated adequate oropharyngeal swallow without overt s/s of aspiration noted throughout BSE with limited bolus attempts; pt consumed thin via spoon and small sips via cup without overt s/s of aspiration present.  No other consistencies attempted at this time d/t limited medical clearance.  Recommend for  initiation of thin liquid diet (per medical clearance) with ST f/u for diet tolerance/aspiration precaution education while in acute setting.  Thank you for this consult. SLP Visit Diagnosis: Dysphagia, unspecified (R13.10)     Aspiration Risk  Mild aspiration risk    Diet Recommendation   Thin liquids  Medication Administration: Via alternative means    Other  Recommendations Oral Care Recommendations: Oral care QID   Follow up Recommendations Skilled Nursing facility;24 hour supervision/assistance      Frequency and Duration min 2x/week  1 week       Prognosis Prognosis for Safe Diet Advancement: Good      Swallow Study   General Date of Onset: 02/02/19 HPI: 58 y.o. 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 She is accompanied by her daughter who provided history as the patient is unable to do so. Per report, the patient has been complaining of "flu-like symptoms" for the past few days. Endorses fever, chills, N/V, abdominal pain. She lives alone but has a caretaker who lives nearby. She was found on the floor today very altered. Type of Study: Bedside Swallow Evaluation Previous Swallow Assessment: (n /a) Diet Prior to this Study: NPO Temperature Spikes Noted: No Respiratory Status: Room air History of Recent Intubation: Yes Length of Intubations (days): (7) Date extubated: 02/09/19 Behavior/Cognition: Alert;Cooperative;Pleasant mood Oral Cavity Assessment: Dry Oral Care Completed by SLP: Yes Oral Cavity - Dentition: Poor condition;Missing dentition Self-Feeding Abilities: Needs assist;Needs set up Patient Positioning: Upright in bed Baseline Vocal Quality: Low vocal intensity Volitional Cough: Weak Volitional Swallow: Able to elicit    Oral/Motor/Sensory Function Overall Oral Motor/Sensory Function: Within functional limits   Ice Chips Ice chips: Within functional limits Presentation: Spoon   Thin Liquid Thin Liquid: Within functional limits Presentation: Straw;Spoon    Nectar Thick Nectar Thick Liquid:  Not tested   Honey Thick Honey Thick Liquid: Not tested   Puree Puree: Not tested   Solid     Solid: Not tested Other Comments: (medically not ready d/t obstruction)      Elvina Sidle, M.S., CCC-SLP 02/15/2019,1:52 PM

## 2019-02-16 DIAGNOSIS — I96 Gangrene, not elsewhere classified: Secondary | ICD-10-CM

## 2019-02-16 DIAGNOSIS — I739 Peripheral vascular disease, unspecified: Secondary | ICD-10-CM

## 2019-02-16 LAB — CBC WITH DIFFERENTIAL/PLATELET
Abs Immature Granulocytes: 0.05 10*3/uL (ref 0.00–0.07)
Basophils Absolute: 0 10*3/uL (ref 0.0–0.1)
Basophils Relative: 0 %
Eosinophils Absolute: 0 10*3/uL (ref 0.0–0.5)
Eosinophils Relative: 1 %
HCT: 19.9 % — ABNORMAL LOW (ref 36.0–46.0)
Hemoglobin: 6.1 g/dL — CL (ref 12.0–15.0)
Immature Granulocytes: 1 %
Lymphocytes Relative: 18 %
Lymphs Abs: 0.9 10*3/uL (ref 0.7–4.0)
MCH: 28.9 pg (ref 26.0–34.0)
MCHC: 30.7 g/dL (ref 30.0–36.0)
MCV: 94.3 fL (ref 80.0–100.0)
Monocytes Absolute: 0.7 10*3/uL (ref 0.1–1.0)
Monocytes Relative: 13 %
Neutro Abs: 3.5 10*3/uL (ref 1.7–7.7)
Neutrophils Relative %: 67 %
Platelets: 338 10*3/uL (ref 150–400)
RBC: 2.11 MIL/uL — ABNORMAL LOW (ref 3.87–5.11)
RDW: 15.9 % — ABNORMAL HIGH (ref 11.5–15.5)
WBC: 5.2 10*3/uL (ref 4.0–10.5)
nRBC: 0 % (ref 0.0–0.2)

## 2019-02-16 LAB — GLUCOSE, CAPILLARY
Glucose-Capillary: 223 mg/dL — ABNORMAL HIGH (ref 70–99)
Glucose-Capillary: 231 mg/dL — ABNORMAL HIGH (ref 70–99)
Glucose-Capillary: 238 mg/dL — ABNORMAL HIGH (ref 70–99)
Glucose-Capillary: 275 mg/dL — ABNORMAL HIGH (ref 70–99)
Glucose-Capillary: 330 mg/dL — ABNORMAL HIGH (ref 70–99)

## 2019-02-16 LAB — RENAL FUNCTION PANEL
Albumin: 1.2 g/dL — ABNORMAL LOW (ref 3.5–5.0)
Anion gap: 8 (ref 5–15)
BUN: 30 mg/dL — ABNORMAL HIGH (ref 6–20)
CO2: 21 mmol/L — ABNORMAL LOW (ref 22–32)
Calcium: 7.9 mg/dL — ABNORMAL LOW (ref 8.9–10.3)
Chloride: 109 mmol/L (ref 98–111)
Creatinine, Ser: 1.34 mg/dL — ABNORMAL HIGH (ref 0.44–1.00)
GFR calc Af Amer: 51 mL/min — ABNORMAL LOW (ref >60)
GFR calc non Af Amer: 44 mL/min — ABNORMAL LOW (ref >60)
Glucose, Bld: 253 mg/dL — ABNORMAL HIGH (ref 70–99)
Phosphorus: 2.6 mg/dL (ref 2.5–4.6)
Potassium: 4.2 mmol/L (ref 3.5–5.1)
Sodium: 138 mmol/L (ref 135–145)

## 2019-02-16 LAB — BASIC METABOLIC PANEL
Anion gap: 8 (ref 5–15)
BUN: 30 mg/dL — ABNORMAL HIGH (ref 6–20)
CO2: 21 mmol/L — ABNORMAL LOW (ref 22–32)
Calcium: 7.9 mg/dL — ABNORMAL LOW (ref 8.9–10.3)
Chloride: 109 mmol/L (ref 98–111)
Creatinine, Ser: 1.23 mg/dL — ABNORMAL HIGH (ref 0.44–1.00)
GFR calc Af Amer: 56 mL/min — ABNORMAL LOW (ref >60)
GFR calc non Af Amer: 49 mL/min — ABNORMAL LOW (ref >60)
Glucose, Bld: 253 mg/dL — ABNORMAL HIGH (ref 70–99)
Potassium: 4.2 mmol/L (ref 3.5–5.1)
Sodium: 138 mmol/L (ref 135–145)

## 2019-02-16 LAB — HEMOGLOBIN AND HEMATOCRIT, BLOOD
HCT: 25 % — ABNORMAL LOW (ref 36.0–46.0)
Hemoglobin: 7.9 g/dL — ABNORMAL LOW (ref 12.0–15.0)

## 2019-02-16 LAB — MAGNESIUM: Magnesium: 1.9 mg/dL (ref 1.7–2.4)

## 2019-02-16 LAB — PREPARE RBC (CROSSMATCH)

## 2019-02-16 LAB — PHOSPHORUS: Phosphorus: 2.6 mg/dL (ref 2.5–4.6)

## 2019-02-16 MED ORDER — SODIUM CHLORIDE 0.9% IV SOLUTION: Freq: Once | INTRAVENOUS | Status: DC

## 2019-02-16 MED ORDER — TRAVASOL 10 % IV SOLN
INTRAVENOUS | Status: AC
  Administered 2019-02-16: 18:00:00 via INTRAVENOUS
  Filled 2019-02-16: qty 720

## 2019-02-16 MED ORDER — SUCRALFATE 1 GM/10ML PO SUSP
1.0000 g | Freq: Three times a day (TID) | ORAL | Status: DC
  Administered 2019-02-16 – 2019-02-21 (×9): 1 g via ORAL
  Filled 2019-02-16 (×12): qty 10

## 2019-02-16 NOTE — Progress Notes (Signed)
PHARMACY - TOTAL PARENTERAL NUTRITION CONSULT NOTE   Indication: Small bowel obstruction  Patient Measurements: Height: 5\' 2"  (157.5 cm) Weight: 130 lb 15.3 oz (59.4 kg) IBW/kg (Calculated) : 50.1 TPN AdjBW (KG): 57.9 Body mass index is 23.95 kg/m. Usual Weight: 55-60 kg  Assessment:  60 yoF with poorly controlled DM s/p BL BKA, admitted 11/23 with AMS, n/v, abdominal pain. CT abdomen showed pneumoperitoneum and mesenteric ischemia. Underwent ex lap on 11/23 with finding of perforated prepyloric ulcer which was oversewn, along with gross contamination of the peritoneum. Hospital course complicated by DKA, AKI, acute thrombocytopenia, and C. glabrata candidemia. Patient remains with high NGT d/t bowel edema as demonstrated on UGI. TPN started on 11/30 but was stopped shortly thereafter to facilitate central line holiday for candidemia. Repeat blood cultures remain negative to date, and TPN now restarting with replacement of central line. Patient at risk for refeeding given prolonged lack of nutrition.  Glucose / Insulin: Hx DM; prev required D10 W infusion to maintain euglycemia Current moderate Novolog scale - scheduled during the day only - changed to q6h (12/5)  CBGs elevated, 35 units regular insulin past 24 hr, Add insulin to TPN  Electrolytes: wnl exc Bicarb remains 21; corrected Ca 10.1 Renal: AKI on admit, SCr 1.23 today LFTs / TGs: WNL (11/30), 126 (12/4) Prealbumin / albumin: both low (11/30), 5.2 (12/4) I/O: Variable NG output, none charted 12/5, but 850 ml out since 7mn today, 600 ml UOP MIVF: 1/2 NS at 50 ml/hr GI Imaging: 11/30 UGI: high NG output & bowel edema Surgeries / Procedures: 11/22 ex lap: oversew of duodenal ulcer with [name] patch  Central access: PICC replaced 12/3  TPN start date: resumed 12/3 (~ 2200)  Nutritional Goals (per RD recommendation on 11/30): Kcal:  2025-2330 kcal Protein:  115-130 grams Fluid:  >/= 2 L/day Goal TPN rate is 90 mL/hr (provides  129 g of protein and 2047 kcals per day) - GIR 3.3 mg/kg/min  Current Nutrition:  Begin Bariatric CL diet - avoid increasing blood sugars  Plan: at 1800  Continue custom TPN at 50 mL/hr   Electrolytes in TPN: (incr) 86mEq/L Na, 20mEq/L K,(decr) 65mEq/L Ca, 73mEq/L Mag, and 31mmol/L of Phos; continue Cl:Ac ratio 1:2  Add standard MVI and trace elements to TPN  Continue moderate SSI with q6h CBG checks and adjust as needed   Add 20 units Novolog to TPN  Continue 1/2 NS at 50 ml/hr  Monitor TPN labs on Mon/Thurs  BMET, Mag, Phos in am  Minda Ditto PharmD 02/16/2019, 7:01 AM

## 2019-02-16 NOTE — Progress Notes (Addendum)
Patient ID: Gina [name], female   DOB: May 03, 1960, 58 y.o.   MRN: [id number]    14 Days Post-Op  Subjective: Awake, talking.  Denies complaints. Asking if she will be home in time for Christmas. Noted she was cleared by speech for thin liquids. Hgb 6.1 from 7.4 2d ago - noted she is receiving transfusion today  Objective: Vital signs in last 24 hours: Temp:  [97.1 F (36.2 C)-98.9 F (37.2 C)] 98.6 F (37 C) (12/06 0457) Pulse Rate:  [90-127] 99 (12/06 0630) Resp:  [10-25] 18 (12/06 0630) BP: (111-168)/(45-124) 137/67 (12/06 0600) SpO2:  [100 %] 100 % (12/05 1200) Weight:  [59.4 kg] 59.4 kg (12/06 0530) Last BM Date: 02/15/19  Intake/Output from previous day: 12/05 0701 - 12/06 0700 In: 1937 [I.V.:1692.3; IV Piggyback:244.7] Out: 1450 [Urine:600; Emesis/NG output:850] Intake/Output this shift: No intake/output data recorded.  PE: Gen: sleepy, but arouses and doesn't talk much sense Abd: soft, nontender, flexi-seal.  Midline incision is clean and packed.  JP serous in output.  Lab Results:  Recent Labs    02/14/19 0209 02/16/19 0254  WBC 5.5 5.2  HGB 7.4* 6.1*  HCT 24.6* 19.9*  PLT 296 338   BMET Recent Labs    02/15/19 0158 02/16/19 0254  NA 139 138  138  K 4.1 4.2  4.2  CL 111 109  109  CO2 21* 21*  21*  GLUCOSE 200* 253*  253*  BUN 24* 30*  30*  CREATININE 1.22* 1.23*  1.34*  CALCIUM 7.4* 7.9*  7.9*   PT/INR No results for input(s): LABPROT, INR in the last 72 hours. CMP     Component Value Date/Time   NA 138 02/16/2019 0254   NA 138 02/16/2019 0254   NA 139 10/04/2017 1705   K 4.2 02/16/2019 0254   K 4.2 02/16/2019 0254   CL 109 02/16/2019 0254   CL 109 02/16/2019 0254   CO2 21 (L) 02/16/2019 0254   CO2 21 (L) 02/16/2019 0254   GLUCOSE 253 (H) 02/16/2019 0254   GLUCOSE 253 (H) 02/16/2019 0254   BUN 30 (H) 02/16/2019 0254   BUN 30 (H) 02/16/2019 0254   BUN 11 10/04/2017 1705   CREATININE 1.34 (H) 02/16/2019 0254   CREATININE 1.23  (H) 02/16/2019 0254   CREATININE 0.68 04/28/2016 1640   CALCIUM 7.9 (L) 02/16/2019 0254   CALCIUM 7.9 (L) 02/16/2019 0254   PROT 4.9 (L) 02/14/2019 0209   PROT 7.1 10/04/2017 1705   ALBUMIN 1.2 (L) 02/16/2019 0254   ALBUMIN 3.7 10/04/2017 1705   AST 9 (L) 02/14/2019 0209   ALT 12 02/14/2019 0209   ALKPHOS 56 02/14/2019 0209   BILITOT 0.6 02/14/2019 0209   BILITOT <0.2 10/04/2017 1705   GFRNONAA 44 (L) 02/16/2019 0254   GFRNONAA 49 (L) 02/16/2019 0254   GFRNONAA >89 04/28/2016 1640   GFRAA 51 (L) 02/16/2019 0254   GFRAA 56 (L) 02/16/2019 0254   GFRAA >89 04/28/2016 1640   Lipase     Component Value Date/Time   LIPASE 27 04/01/2016 1040       Studies/Results: No results found.  Anti-infectives: Anti-infectives (From admission, onward)   Start     Dose/Rate Route Frequency Ordered Stop   02/05/19 1000  anidulafungin (ERAXIS) 100 mg in sodium chloride 0.9 % 100 mL IVPB     100 mg 78 mL/hr over 100 Minutes Intravenous Every 24 hours 02/04/19 0840     02/04/19 1000  anidulafungin (ERAXIS) 200 mg in sodium chloride  0.9 % 200 mL IVPB     200 mg 78 mL/hr over 200 Minutes Intravenous  Once 02/04/19 0840 02/04/19 1301   02/04/19 0200  fluconazole (DIFLUCAN) IVPB 200 mg  Status:  Discontinued     200 mg 100 mL/hr over 60 Minutes Intravenous Every 24 hours 02/02/19 2302 02/04/19 0840   02/03/19 1800  vancomycin (VANCOCIN) IVPB 750 mg/150 ml premix  Status:  Discontinued     750 mg 150 mL/hr over 60 Minutes Intravenous Every 24 hours 02/03/19 0241 02/03/19 0242   02/03/19 1800  vancomycin (VANCOCIN) IVPB 750 mg/150 ml premix  Status:  Discontinued     750 mg 150 mL/hr over 60 Minutes Intravenous Every 24 hours 02/03/19 0242 02/03/19 0858   02/03/19 0600  piperacillin-tazobactam (ZOSYN) IVPB 3.375 g     3.375 g 12.5 mL/hr over 240 Minutes Intravenous Every 8 hours 02/03/19 0243     02/02/19 2315  fluconazole (DIFLUCAN) IVPB 400 mg     400 mg 100 mL/hr over 120 Minutes  Intravenous STAT 02/02/19 2301 02/03/19 0218   02/02/19 1800  ceFEPIme (MAXIPIME) 2 g in sodium chloride 0.9 % 100 mL IVPB     2 g 200 mL/hr over 30 Minutes Intravenous  Once 02/02/19 1745 02/02/19 2030   02/02/19 1800  metroNIDAZOLE (FLAGYL) IVPB 500 mg     500 mg 100 mL/hr over 60 Minutes Intravenous  Once 02/02/19 1745 02/02/19 2237   02/02/19 1800  vancomycin (VANCOCIN) IVPB 1000 mg/200 mL premix  Status:  Discontinued     1,000 mg 200 mL/hr over 60 Minutes Intravenous  Once 02/02/19 1745 02/02/19 1747   02/02/19 1800  vancomycin (VANCOCIN) 1,250 mg in sodium chloride 0.9 % 250 mL IVPB     1,250 mg 166.7 mL/hr over 90 Minutes Intravenous  Once 02/02/19 1747 02/02/19 2237       Assessment/Plan VDRF- extubated 11/29 Poorly controlled DM2- A1c15.2 S/p bilateral BKA Ischemic changes to both LE- defer to medicine, stable Acute onCKD  HTN Schizophrenia DKA Leukopenia Severe malnutrition, Prealbumin <5 on 11/30 Anemia  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 Lucia Gaskins, for perforated prepyloric ulcer with gross contamination - BID wet to dry dressing changes to midline abdominal wound - BID PPI; added carafate -pulm toilet and IS -OOB to chair as much as possible -mobilize, PT/OT -Start clear liquids today - avoid concentrated sweets given poorly controlled DM (therefore ordered bariatric clear liquids) -cont TNA for now for nutritional support until oral intake adequate.  ID - Eraxis 11/24>>Zosyn 11/23 >> 14 days FEN - start bariatric clears (no conc sweets) VTE - NoSCDs,Bilateral BKa's - sq heparin  Foley -out Follow up -Dr. Lucia Gaskins   LOS: 83 days   Nadeen Landau, M.D. [facility]

## 2019-02-16 NOTE — Consult Note (Addendum)
REASON FOR CONSULT:    Ischemic below the knee amputation.  The consult is requested by [facility]  ASSESSMENT & PLAN:   GANGRENE TO BILATERAL BELOW THE KNEE AMPUTATIONS: This patient has developed gangrenous changes, as documented below, to bilateral below the knee amputations.  This is likely related to her pressors that she was on when she was septic.  She is now off of her pressors.  She does have excellent arterial flow with a palpable femoral and popliteal pulse bilaterally.  However given the extent of the wounds I suspect she will need bilateral above-the-knee amputations.  It does not look like there is enough tissue to revise her BKA's but I would have to leave this to Dr. Sharol Given.  Her below the knee amputations were performed by Dr. [name].  He will need to be consulted non-urgently.  She is very reluctant to consider further amputations.  Fortunately, currently I do not think the wounds are contributing to her illness as she simply has dry gangrene.  No further vascular work-up is indicated.  Vascular surgery will be available as needed.  Gina Mayo, Gina [name] Office: [phone]   HPI:   Gina [name] is a pleasant 58 y.o. female, who was admitted on 02/02/2019 with nausea vomiting and abdominal pain.  CT scan showed evidence of visceral perforation.  On 02/03/2019, she underwent exploratory laparotomy and oversewing of duodenal ulcer with a [name] patch.  She was septic requiring pressors I suspect this is when she developed the ischemic changes to her BKA stumps.  These were done by Dr. [name].  The patient underwent a right below the knee amputation on 11/11/2017.  The patient underwent a left below the knee amputation by Dr. [name] on 05/10/2018.  The patient states that she is in a wheelchair and is nonambulatory.  She denies significant pain associated with the wounds on both BKA's.  She has made good progress and is now off pressors.  Risk factors for  peripheral vascular disease include diabetes and hypertension.  She denies any history of hypercholesterolemia, family history of premature cardiovascular disease, or smoking.  Past Medical History:  Diagnosis Date  . Diabetes mellitus   . Hypertension   . Osteomyelitis of right foot ([place]) 02/22/2017  . Schizophrenia ([place])   . Septic arthritis of interphalangeal joint of toe of left foot ([place]) 06/02/2016  . Stress incontinence 04/26/2017    Family History  Problem Relation Age of Onset  . Diabetes type II Father   . CAD Father   . Prostate cancer Father   . Diabetes Mellitus II Brother     SOCIAL HISTORY: Social History   Socioeconomic History  . Marital status: Single    Spouse name: Not on file  . Number of children: Not on file  . Years of education: Not on file  . Highest education level: Not on file  Occupational History  . Not on file  Social Needs  . Financial resource strain: Not on file  . Food insecurity    Worry: Not on file    Inability: Not on file  . Transportation needs    Medical: Not on file    Non-medical: Not on file  Tobacco Use  . Smoking status: Never Smoker  . Smokeless tobacco: Never Used  Substance and Sexual Activity  . Alcohol use: No    Comment: occasionally  . Drug use: No  . Sexual activity: Not Currently  Lifestyle  . Physical activity  Days per week: Not on file    Minutes per session: Not on file  . Stress: Not on file  Relationships  . Social connections    Talks on phone: Not on file    Gets together: Not on file    Attends religious service: Not on file    Active member of club or organization: Not on file    Attends meetings of clubs or organizations: Not on file    Relationship status: Not on file  . Intimate partner violence    Fear of current or ex partner: Not on file    Emotionally abused: Not on file    Physically abused: Not on file    Forced sexual activity: Not on file  Other Topics Concern  . Not on file   Social History Narrative  . Not on file    Allergies  Allergen Reactions  . Bee Venom Swelling    Swells up with bee stings   . Metformin And Related Other (See Comments)    Upset stomach    Current Facility-Administered Medications  Medication Dose Route Frequency Provider Last Rate Last Dose  . 0.45 % sodium chloride infusion   Intravenous Continuous Green, Terri L, RPH 50 mL/hr at 02/16/19 0800    . 0.9 %  sodium chloride infusion (Manually program via Guardrails IV Fluids)   Intravenous Once Bodenheimer, Charles A, NP      . 0.9 %  sodium chloride infusion  250 mL Intravenous Continuous Schertz, Adam Ross, Gina [name]   Stopped at 02/13/19 2137  . acetaminophen (TYLENOL) suppository 650 mg  650 mg Rectal Q4H PRN Alva, Rakesh V, Gina [name]   650 mg at 02/04/19 1238  . anidulafungin (ERAXIS) 100 mg in sodium chloride 0.9 % 100 mL IVPB  100 mg Intravenous Q24H Van Dam, Cornelius N, Gina [name] 78 mL/hr at 02/16/19 0920 100 mg at 02/16/19 0920  . chlorhexidine gluconate (MEDLINE KIT) (PERIDEX) 0.12 % solution 15 mL  15 mL Mouth Rinse BID Schertz, Adam Ross, Gina [name]   15 mL at 02/16/19 0817  . Chlorhexidine Gluconate Cloth 2 % PADS 6 each  6 each Topical Daily Schertz, Adam Ross, Gina [name]   6 each at 02/16/19 0921  . dextrose 50 % solution 0-50 mL  0-50 mL Intravenous PRN Schertz, Adam Ross, Gina [name]   25 mL at 02/07/19 0409  . Gerhardt's butt cream   Topical TID Ollis, Brandi L, NP   1 application at 02/16/19 0921  . heparin injection 5,000 Units  5,000 Units Subcutaneous Q8H Ellison, Chi Jane, Gina [name]   5,000 Units at 02/16/19 0515  . insulin aspart (novoLOG) injection 0-15 Units  0-15 Units Subcutaneous Q6H Green, Terri L, RPH   5 Units at 02/16/19 0526  . lip balm (CARMEX) ointment   Topical PRN Ellison, Chi Jane, Gina [name]      . morphine 2 MG/ML injection 1-2 mg  1-2 mg Intravenous Q3H PRN Bodenheimer, Charles A, NP   2 mg at 02/15/19 1953  . ondansetron (ZOFRAN) injection 4 mg  4 mg Intravenous Q6H PRN Schertz, Adam Ross, Gina [name]      .  pantoprazole (PROTONIX) injection 40 mg  40 mg Intravenous Q12H Newman, David, Gina [name]   40 mg at 02/16/19 0919  . piperacillin-tazobactam (ZOSYN) IVPB 3.375 g  3.375 g Intravenous Q8H Gadhia, Jigna M, RPH   Stopped at 02/16/19 0916  . sodium chloride flush (NS) 0.9 % injection 10-40 mL  10-40 mL Intracatheter Q12H Rizwan, Saima, Gina [name]     10 mL at 02/16/19 0921  . sodium chloride flush (NS) 0.9 % injection 10-40 mL  10-40 mL Intracatheter PRN Rizwan, Eunice Blase, Gina [name]      . sucralfate (CARAFATE) 1 GM/10ML suspension 1 g  1 g Oral TID WC & HS Ileana Roup, Gina [name]   1 g at 02/16/19 0817  . TPN ADULT (ION)   Intravenous Continuous TPN Minda Ditto, RPH 50 mL/hr at 02/16/19 0800      REVIEW OF SYSTEMS:  [X] denotes positive finding, [ ] denotes negative finding Cardiac  Comments:  Chest pain or chest pressure:    Shortness of breath upon exertion: x   Short of breath when lying flat:    Irregular heart rhythm:        Vascular    Pain in calf, thigh, or hip brought on by ambulation:    Pain in feet at night that wakes you up from your sleep:     Blood clot in your veins:    Leg swelling:         Pulmonary    Oxygen at home:    Productive cough:     Wheezing:         Neurologic    Sudden weakness in arms or legs:     Sudden numbness in arms or legs:     Sudden onset of difficulty speaking or slurred speech:    Temporary loss of vision in one eye:     Problems with dizziness:         Gastrointestinal    Blood in stool:     Vomited blood:         Genitourinary    Burning when urinating:     Blood in urine:        Psychiatric    Major depression:         Hematologic    Bleeding problems:    Problems with blood clotting too easily:        Skin    Rashes or ulcers:        Constitutional    Fever or chills:     PHYSICAL EXAM:   Vitals:   02/16/19 0630 02/16/19 0700 02/16/19 0800 02/16/19 0900  BP:  (!) 144/66 (!) 145/74 138/66  Pulse: 99 98 98 100  Resp: _0 Temp:       TempSrc:      SpO2:      Weight:      Height:        GENERAL: The patient is a well-nourished female, in no acute distress. The vital signs are documented above. CARDIAC: There is a regular rate and rhythm.  VASCULAR: I do not detect carotid bruits. She has palpable femoral and popliteal pulses bilaterally. PULMONARY: There is good air exchange bilaterally without wheezing or rales. ABDOMEN: Soft and non-tender with normal pitched bowel sounds.  MUSCULOSKELETAL: She has bilateral below the knee amputations with gangrene on both amputation sites.  RIGHT BKA:    LEFT BKA:      NEUROLOGIC: No focal weakness or paresthesias are detected. SKIN: There are no ulcers or rashes noted. PSYCHIATRIC: The patient has a normal affect.  DATA:    LABS: White blood cell count is 5.2.  Hemoglobin 6.1.  Platelets 338,000.

## 2019-02-16 NOTE — Progress Notes (Signed)
Surgery notified of increased JP output. 900 cc of thin, green, bile-like liquid emptied from JP in 30 minute span. Instructed by surgery to make patient NPO and to monitor for low urine output and tachycardia that may indicate hypovolemia. Will continue to monitor at this time.

## 2019-02-16 NOTE — Progress Notes (Signed)
PROGRESS NOTE    Gina [name]   JSE:[id number]  DOB: 1960-05-09  DOA: 02/02/2019 PCP: [name], MD   Brief Narrative:  Patient is a 58 year old female with past medical history significant for poorly controlled diabetes mellitus type 2 with diabetic foot ulcers status post bilateral BKA, recent left phalanx amputation (10/17/ 2020) , HTN and schizophrenia.  Patient was admitted with altered mental status, nausea, vomiting and abdominal pain. In the ED, patient was hypothermic and severely hypotensive. Initial blood glucose was greater than 1000.  CT abdomen showed pneumoperitoneum and mesenteric ischemia.  Patient was initially admitted to ICU service and started on IV antibiotics.  Patient was also on pressors at some point.  On 02/03/2019, patient underwent exploratory Laparotomy with finding of perforated prepyloric ulcer which was oversewn and 3.1 L of contaminated peritoneal fluid.  Patient was extubated on 02/09/2019.  Hospital course has been complicated by by DKA, AKI, acute blood loss anemia and acute thrombocytopenia, intra-abdominal sepsis and fungemia.  Patient was transferred out of ICU to [facility] on 02/10/19. General surgery is also following patient.  On 03/07/2019, NG tube was pulled.  Patient is currently on ice chips, and oral intake is been titrated as tolerated.  Patient remains on TPN at 50 cc/h. Will await speech evaluation.  With severe sepsis, hypotension and use of pressors, patient may have developed ischemic changes to bilateral BKA.  Vascular surgery has been consulted.  Dr. Sharol Given would need to be consulted for possible above-knee amputation when patient is much more stable.  Subjective: Patient is a poor historian.  No significant history from patient.  Assessment & Plan: Septic shock due to ruptured gastric ulcer with acute peritonitis, fungemia and UTI - ID consulted on 02/04/19 - Blood cultures from 11/22> Candida Glabrata- repeat blood culture  11/24 NGTD - Urine culture 11/22 > K pneumoniae - Peritoneal fluid 11/23> Candida albicans - Opthalmology consulted - no evidence of fungal endophthalmitis  - currently receiving Andulafungin and Zosyn - ID has recommended to remove cental line to give line holiday and right IJ was removed on 12/1 - placed PICC 12/3- Gen surgery resumed TPN and plans to clamp NG today - she is noted to have watery stool for the past few days and has been-  requiring a Flexi-Seal -02/15/2019: Sepsis physiology has resolved significantly.  Infectious disease team is managing fungemia and intra-abdominal infection with anidulafungin (day 5 of 14) and Zosyn (day 13 of 14). -02/16/2019: Patient has completed course of IV Zosyn.  Continue anidulafungin.  Vascular surgery input is appreciated (please see the consult note).  Patient may eventually need bilateral above-knee amputation.  Underwent amputation of the left middle finger by Dr. Gavin Pound, orthopedic surgeon, on 12/28/2018.  Please consult Dr. Apolonio Schneiders in the morning to reviewed the left hand and to remove the stitches.  Acute kidney injury  - baseline Cr is < 1.0 - Cr rose to 2.66 on 11/26 and is steadily improving - foley cath removed on 11/30-urine output is adequate 02/15/2019: Serum creatinine today is 1.22.  AKI is slowly resolving.  Etiology is likely multifactorial.  02/16/2019: Serum creatinine is down to 1.23 today.  Continue to monitor closely.  Acute delirium - her only psych medication was Lexapro which has been on hold as she has been NPO - acute confusion, hallucinations, paranoia anxiety noted on the first day that I evaluated her (11/30) - CT head ordered and is negative - Checked UA for underlying infection (already received Zosyn)- UA  shows rare bacteria but does show yeast and WBC which is not unexpected considering her current candidal infection  - B12 is not low (elevated) - Ammonia < 9 - - have asked for a psych eval-as needed Haldol  recommended however we have not needed this as she is not severely agitated -Resume Lexapro once able to tolerate orals 02/16/2019: Resolving.  Continue to manage expectantly.    DKA (diabetic ketoacidoses), severely uncontrolled with hyperglycemia  - resolved - A1c on 11/23>  15.2 - Uses Lantus and Novolog as outpt- currently on sliding scale insulin and NPO 03/07/2019: DKA has resolved.  Continue to optimize blood sugar control.  Hypokalemia - replaced 02/15/2019: Potassium is 4.1 today.  Severe protein calorie malnutrition with hypoalbuminemia - NPO at this time and receiving TPN 02/15/2019: Patient still on TPN.  NG tube has been discontinued.  Currently on ice chips.  Awaiting speech evaluation.  Will likely advance oral intake as tolerated with time  Anemia due to acute blood loss -  s/p 1 U PRBC on 11/28 for Hb of 6.1 -  remains slightly anemic with Hb ~ 7 -  anemia panel shows low normal Iron stores, Normal folate and elevated B12 -  transfused for Hb persistently < 7 02/15/2019: Repeat CBC in the morning. 02/16/2019: IMA globin is 6.1 g/dL today.  Patient be transfused with 1 unit of packed red blood cells.  Continue to monitor H&H closely.  Acute thrombocytopenia - related to septic shock - nadir was > 31 on 11/27 - Platelets have risen back to normal  Blebs and mottling on distal extremities - noted to have dusky skin with large blebs (not fluid filled) on stumps and hands-   - I am not sure how long these have been present but I noted them on the day she was transferred to our service-- per RN who was taking care of her during the time they developed, it occurred after Levophed was started (? If she had ischemia from vasoconstriction) - extremities are warm and appear to be perfusing well - blebs do not appear to be progressing and actually are resolving but skin breakdown is occurring- local wound care being done- Wound care RN also consulted to give input 02/15/2019: We  will have a low threshold to consult vascular surgery team. 02/16/2019: Currently see above.  Patient has been seen by the vascular surgery team.  Patient with likely need above-knee amputation bilaterally.  Patient will need to follow-up with Dr. Sharol Given, who performed the BKA.  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 02/16/2019: Please consult Dr. Gavin Pound, orthopedic surgeon in the morning to remove the sutures.  B/ l BKA   Time spent in minutes: 35 min  DVT prophylaxis: heparin Code Status: Full code Family Communication:  Disposition Plan: This will depend on hospital course. Consultants:   General surgery  ID Procedures:   Intubation/ Extubation  Ex lap Antimicrobials:  Anti-infectives (From admission, onward)   Start     Dose/Rate Route Frequency Ordered Stop   02/05/19 1000  anidulafungin (ERAXIS) 100 mg in sodium chloride 0.9 % 100 mL IVPB     100 mg 78 mL/hr over 100 Minutes Intravenous Every 24 hours 02/04/19 0840     02/04/19 1000  anidulafungin (ERAXIS) 200 mg in sodium chloride 0.9 % 200 mL IVPB     200 mg 78  mL/hr over 200 Minutes Intravenous  Once 02/04/19 0840 02/04/19 1301   02/04/19 0200  fluconazole (DIFLUCAN) IVPB 200 mg  Status:  Discontinued     200 mg 100 mL/hr over 60 Minutes Intravenous Every 24 hours 02/02/19 2302 02/04/19 0840   02/03/19 1800  vancomycin (VANCOCIN) IVPB 750 mg/150 ml premix  Status:  Discontinued     750 mg 150 mL/hr over 60 Minutes Intravenous Every 24 hours 02/03/19 0241 02/03/19 0242   02/03/19 1800  vancomycin (VANCOCIN) IVPB 750 mg/150 ml premix  Status:  Discontinued     750 mg 150 mL/hr over 60 Minutes Intravenous Every 24 hours 02/03/19 0242 02/03/19 0858   02/03/19 0600  piperacillin-tazobactam (ZOSYN) IVPB 3.375 g     3.375 g 12.5 mL/hr over 240 Minutes Intravenous Every 8 hours 02/03/19 0243      02/02/19 2315  fluconazole (DIFLUCAN) IVPB 400 mg     400 mg 100 mL/hr over 120 Minutes Intravenous STAT 02/02/19 2301 02/03/19 0218   02/02/19 1800  ceFEPIme (MAXIPIME) 2 g in sodium chloride 0.9 % 100 mL IVPB     2 g 200 mL/hr over 30 Minutes Intravenous  Once 02/02/19 1745 02/02/19 2030   02/02/19 1800  metroNIDAZOLE (FLAGYL) IVPB 500 mg     500 mg 100 mL/hr over 60 Minutes Intravenous  Once 02/02/19 1745 02/02/19 2237   02/02/19 1800  vancomycin (VANCOCIN) IVPB 1000 mg/200 mL premix  Status:  Discontinued     1,000 mg 200 mL/hr over 60 Minutes Intravenous  Once 02/02/19 1745 02/02/19 1747   02/02/19 1800  vancomycin (VANCOCIN) 1,250 mg in sodium chloride 0.9 % 250 mL IVPB     1,250 mg 166.7 mL/hr over 90 Minutes Intravenous  Once 02/02/19 1747 02/02/19 2237       Objective: Vitals:   02/16/19 0630 02/16/19 0700 02/16/19 0800 02/16/19 0900  BP:  (!) 144/66 (!) 145/74 138/66  Pulse: 99 98 98 100  Resp: _0 Temp:      TempSrc:      SpO2:      Weight:      Height:        Intake/Output Summary (Last 24 hours) at 02/16/2019 0949 Last data filed at 02/16/2019 0800 Gross per 24 hour  Intake 3693.1 ml  Output 1800 ml  Net 1893.1 ml   Filed Weights   02/14/19 0330 02/15/19 0540 02/16/19 0530  Weight: 54.2 kg 57.5 kg 59.4 kg    Examination: General exam: Appears comfortable.  Not in any distress. HEENT:  Pallor.  No jaundice Respiratory system: Clear to auscultation. Respiratory effort normal. Cardiovascular system: S1 & S2, systolic murmur. Gastrointestinal system: Abdomen soft, mildly tender, vertical incision noted, nondistended, poor bowel sounds-drain and  Flexi-Seal present Central nervous system:  Awake and alert.  Patient moves all extremities.   Extremities: Bilateral BKA Skin: see picture        Data Reviewed: I have personally reviewed following labs and imaging studies  CBC: Recent Labs  Lab 02/10/19 0351 02/11/19 0431 02/13/19 0211  02/14/19 0209 02/16/19 0254  WBC 5.9 6.6 5.2 5.5 5.2  NEUTROABS 4.3  --   --  3.9 3.5  HGB 7.3* 7.2* 7.7* 7.4* 6.1*  HCT 22.8* 22.8* 25.3* 24.6* 19.9*  MCV 89.8 91.6 94.4 94.3 94.3  PLT 110* 161 282 296 510   Basic Metabolic Panel: Recent Labs  Lab 02/10/19 0351 02/11/19 0431 02/11/19 0644 02/12/19 1057 02/13/19 0211 02/14/19 0209 02/15/19 0158 02/16/19  0254  NA 136 136  --  138 140 142 139 138  138  K 3.1* 3.4*  --  3.8 3.9 4.0 4.1 4.2  4.2  CL 99 103  --  106 110 111 111 109  109  CO2 25 24  --  21* 23 21* 21* 21*  21*  GLUCOSE 143* 199*  --  276* 107* 123* 200* 253*  253*  BUN 36* 33*  --  31* 26* 23* 24* 30*  30*  CREATININE 1.77* 1.50*  --  1.56* 1.27* 1.08* 1.22* 1.23*  1.34*  CALCIUM 7.6* 7.5*  --  7.7* 7.6* 7.7* 7.4* 7.9*  7.9*  MG 2.1 2.0  --   --   --  1.8 1.8 1.9  PHOS 3.2  --  3.0  --   --  3.0 2.8 2.6  2.6   GFR: Estimated Creatinine Clearance: 36.6 mL/min (A) (by C-G formula based on SCr of 1.34 mg/dL (H)). Liver Function Tests: Recent Labs  Lab 02/10/19 0351 02/14/19 0209 02/16/19 0254  AST 15 9*  --   ALT 19 12  --   ALKPHOS 57 56  --   BILITOT 0.6 0.6  --   PROT 4.1* 4.9*  --   ALBUMIN 1.2* 1.2* 1.2*   No results for input(s): LIPASE, AMYLASE in the last 168 hours. Recent Labs  Lab 02/12/19 1419  AMMONIA <9*   Coagulation Profile: No results for input(s): INR, PROTIME in the last 168 hours. Cardiac Enzymes: No results for input(s): CKTOTAL, CKMB, CKMBINDEX, TROPONINI in the last 168 hours. BNP (last 3 results) No results for input(s): PROBNP in the last 8760 hours. HbA1C: No results for input(s): HGBA1C in the last 72 hours. CBG: Recent Labs  Lab 02/15/19 1240 02/15/19 1832 02/15/19 2337 02/16/19 0337 02/16/19 0523  GLUCAP 270* 309* 274* 223* 238*   Lipid Profile: Recent Labs    02/14/19 0209  TRIG 126   Thyroid Function Tests: No results for input(s): TSH, T4TOTAL, FREET4, T3FREE, THYROIDAB in the last 72 hours.  Anemia Panel: No results for input(s): VITAMINB12, FOLATE, FERRITIN, TIBC, IRON, RETICCTPCT in the last 72 hours. Urine analysis:    Component Value Date/Time   COLORURINE YELLOW 02/12/2019 1401   APPEARANCEUR CLOUDY (A) 02/12/2019 1401   LABSPEC 1.010 02/12/2019 1401   PHURINE 5.0 02/12/2019 1401   GLUCOSEU >=500 (A) 02/12/2019 1401   HGBUR MODERATE (A) 02/12/2019 1401   BILIRUBINUR NEGATIVE 02/12/2019 1401   BILIRUBINUR N 04/28/2016 1603   KETONESUR NEGATIVE 02/12/2019 1401   PROTEINUR 30 (A) 02/12/2019 1401   UROBILINOGEN 0.2 04/28/2016 1603   UROBILINOGEN 0.2 06/18/2013 0650   NITRITE NEGATIVE 02/12/2019 1401   LEUKOCYTESUR NEGATIVE 02/12/2019 1401   Sepsis Labs: _0 (procalcitonin:4,lacticidven:4) ) Recent Results (from the past 240 hour(s))  Culture, blood (routine x 2)     Status: None   Collection Time: 02/08/19 12:13 PM   Specimen: BLOOD  Result Value Ref Range Status   Specimen Description   Final    BLOOD RIGHT WRIST Performed at [facility] [address] [place], [place] [address]    Special Requests   Final    BOTTLES DRAWN AEROBIC AND ANAEROBIC Blood Culture adequate volume Performed at [facility] [address] [place], [place] [address]    Culture   Final    NO GROWTH 5 DAYS Performed at [facility], [place] [address] [place], [place] [address]    Report Status 02/13/2019 FINAL  Final  Culture, blood (routine x 2)     Status: None   Collection Time: 02/08/19 12:28 PM   Specimen: BLOOD  Result Value Ref Range Status   Specimen Description   Final    BLOOD RIGHT ARM Performed at [facility] [address], [place], [place] [address]    Special Requests   Final    BOTTLES DRAWN AEROBIC ONLY Blood Culture results may not be optimal due to an inadequate volume of blood received in culture bottles Performed at [facility] [address] [place], [place]  [address]    Culture   Final    NO GROWTH 5 DAYS Performed at [facility], [place] [address] [place], [place] [address]    Report Status 02/13/2019 FINAL  Final         Radiology Studies: No results found.    Scheduled Meds: . sodium chloride   Intravenous Once  . chlorhexidine gluconate (MEDLINE KIT)  15 mL Mouth Rinse BID  . Chlorhexidine Gluconate Cloth  6 each Topical Daily  . Gerhardt's butt cream   Topical TID  . heparin injection (subcutaneous)  5,000 Units Subcutaneous Q8H  . insulin aspart  0-15 Units Subcutaneous Q6H  . pantoprazole (PROTONIX) IV  40 mg Intravenous Q12H  . sodium chloride flush  10-40 mL Intracatheter Q12H  . sucralfate  1 g Oral TID WC & HS   Continuous Infusions: . sodium chloride 50 mL/hr at 02/16/19 0800  . sodium chloride Stopped (02/13/19 2137)  . anidulafungin 100 mg (02/16/19 0920)  . piperacillin-tazobactam (ZOSYN)  IV Stopped (02/16/19 0916)  . TPN ADULT (ION) 50 mL/hr at 02/16/19 0800     LOS: 14 days      Bonnell Public, MD [facility] Pager: [email or web address] Password [facility] 02/16/2019, 9:49 AM

## 2019-02-17 ENCOUNTER — Inpatient Hospital Stay (HOSPITAL_COMMUNITY): Payer: Medicare HMO

## 2019-02-17 DIAGNOSIS — K631 Perforation of intestine (nontraumatic): Secondary | ICD-10-CM

## 2019-02-17 DIAGNOSIS — Z89519 Acquired absence of unspecified leg below knee: Secondary | ICD-10-CM

## 2019-02-17 DIAGNOSIS — E43 Unspecified severe protein-calorie malnutrition: Secondary | ICD-10-CM

## 2019-02-17 DIAGNOSIS — I70262 Atherosclerosis of native arteries of extremities with gangrene, left leg: Secondary | ICD-10-CM

## 2019-02-17 DIAGNOSIS — I70261 Atherosclerosis of native arteries of extremities with gangrene, right leg: Secondary | ICD-10-CM

## 2019-02-17 LAB — BPAM RBC
Blood Product Expiration Date: 202101062359
ISSUE DATE / TIME: 202012061112
Unit Type and Rh: 5100

## 2019-02-17 LAB — DIFFERENTIAL
Abs Immature Granulocytes: 0.7 10*3/uL — ABNORMAL HIGH (ref 0.00–0.07)
Band Neutrophils: 5 %
Basophils Absolute: 0 10*3/uL (ref 0.0–0.1)
Basophils Relative: 0 %
Eosinophils Absolute: 0 10*3/uL (ref 0.0–0.5)
Eosinophils Relative: 0 %
Lymphocytes Relative: 21 %
Lymphs Abs: 1.5 10*3/uL (ref 0.7–4.0)
Metamyelocytes Relative: 6 %
Monocytes Absolute: 0.5 10*3/uL (ref 0.1–1.0)
Monocytes Relative: 7 %
Myelocytes: 4 %
Neutro Abs: 4.5 10*3/uL (ref 1.7–7.7)
Neutrophils Relative %: 57 %

## 2019-02-17 LAB — COMPREHENSIVE METABOLIC PANEL
ALT: 9 U/L (ref 0–44)
AST: 9 U/L — ABNORMAL LOW (ref 15–41)
Albumin: 1.2 g/dL — ABNORMAL LOW (ref 3.5–5.0)
Alkaline Phosphatase: 54 U/L (ref 38–126)
Anion gap: 11 (ref 5–15)
BUN: 32 mg/dL — ABNORMAL HIGH (ref 6–20)
CO2: 19 mmol/L — ABNORMAL LOW (ref 22–32)
Calcium: 8.3 mg/dL — ABNORMAL LOW (ref 8.9–10.3)
Chloride: 106 mmol/L (ref 98–111)
Creatinine, Ser: 1.24 mg/dL — ABNORMAL HIGH (ref 0.44–1.00)
GFR calc Af Amer: 56 mL/min — ABNORMAL LOW (ref >60)
GFR calc non Af Amer: 48 mL/min — ABNORMAL LOW (ref >60)
Glucose, Bld: 196 mg/dL — ABNORMAL HIGH (ref 70–99)
Potassium: 4.6 mmol/L (ref 3.5–5.1)
Sodium: 136 mmol/L (ref 135–145)
Total Bilirubin: 0.4 mg/dL (ref 0.3–1.2)
Total Protein: 5.8 g/dL — ABNORMAL LOW (ref 6.5–8.1)

## 2019-02-17 LAB — TRIGLYCERIDES: Triglycerides: 116 mg/dL (ref <150)

## 2019-02-17 LAB — CBC
HCT: 24.8 % — ABNORMAL LOW (ref 36.0–46.0)
Hemoglobin: 7.6 g/dL — ABNORMAL LOW (ref 12.0–15.0)
MCH: 28.9 pg (ref 26.0–34.0)
MCHC: 30.6 g/dL (ref 30.0–36.0)
MCV: 94.3 fL (ref 80.0–100.0)
Platelets: 341 10*3/uL (ref 150–400)
RBC: 2.63 MIL/uL — ABNORMAL LOW (ref 3.87–5.11)
RDW: 15.8 % — ABNORMAL HIGH (ref 11.5–15.5)
WBC: 7.2 10*3/uL (ref 4.0–10.5)
nRBC: 0.4 % — ABNORMAL HIGH (ref 0.0–0.2)

## 2019-02-17 LAB — PREALBUMIN: Prealbumin: 5.3 mg/dL — ABNORMAL LOW (ref 18–38)

## 2019-02-17 LAB — GLUCOSE, CAPILLARY
Glucose-Capillary: 166 mg/dL — ABNORMAL HIGH (ref 70–99)
Glucose-Capillary: 147 mg/dL — ABNORMAL HIGH (ref 70–99)
Glucose-Capillary: 81 mg/dL (ref 70–99)
Glucose-Capillary: 117 mg/dL — ABNORMAL HIGH (ref 70–99)

## 2019-02-17 LAB — TYPE AND SCREEN
ABO/RH(D): O POS
Antibody Screen: NEGATIVE
Unit division: 0

## 2019-02-17 LAB — PHOSPHORUS: Phosphorus: 2.6 mg/dL (ref 2.5–4.6)

## 2019-02-17 LAB — MAGNESIUM: Magnesium: 1.8 mg/dL (ref 1.7–2.4)

## 2019-02-17 MED ORDER — COLLAGENASE 250 UNIT/GM EX OINT
TOPICAL_OINTMENT | Freq: Every day | CUTANEOUS | Status: AC
  Administered 2019-02-17 – 2019-02-18 (×2): via TOPICAL
  Administered 2019-02-19: 1 via TOPICAL
  Administered 2019-02-20 – 2019-02-21 (×2): via TOPICAL
  Administered 2019-02-22: 1 via TOPICAL
  Administered 2019-02-23 – 2019-02-24 (×2): via TOPICAL
  Administered 2019-03-01: 1 via TOPICAL
  Filled 2019-02-17 (×2): qty 30

## 2019-02-17 MED ORDER — IOHEXOL 300 MG/ML  SOLN
300.0000 mL | Freq: Once | INTRAMUSCULAR | Status: AC | PRN
  Administered 2019-02-17: 180 mL

## 2019-02-17 MED ORDER — TRAVASOL 10 % IV SOLN
INTRAVENOUS | Status: AC
  Administered 2019-02-17: 18:00:00 via INTRAVENOUS
  Filled 2019-02-17: qty 1008

## 2019-02-17 MED ORDER — SODIUM CHLORIDE 0.45 % IV SOLN
INTRAVENOUS | Status: AC
  Administered 2019-02-17: 19:00:00 via INTRAVENOUS

## 2019-02-17 MED ORDER — IPRATROPIUM-ALBUTEROL 0.5-2.5 (3) MG/3ML IN SOLN
3.0000 mL | Freq: Three times a day (TID) | RESPIRATORY_TRACT | Status: DC
  Administered 2019-02-18 (×3): 3 mL via RESPIRATORY_TRACT
  Filled 2019-02-17 (×3): qty 3

## 2019-02-17 MED ORDER — METOPROLOL TARTRATE 5 MG/5ML IV SOLN
2.5000 mg | Freq: Four times a day (QID) | INTRAVENOUS | Status: DC
  Administered 2019-02-17 – 2019-03-10 (×84): 2.5 mg via INTRAVENOUS
  Filled 2019-02-17 (×85): qty 5

## 2019-02-17 MED ORDER — INSULIN ASPART 100 UNIT/ML ~~LOC~~ SOLN
0.0000 [IU] | Freq: Four times a day (QID) | SUBCUTANEOUS | Status: DC
  Administered 2019-02-17: 3 [IU] via SUBCUTANEOUS
  Administered 2019-02-18 – 2019-02-19 (×3): 4 [IU] via SUBCUTANEOUS
  Administered 2019-02-19 (×3): 3 [IU] via SUBCUTANEOUS
  Administered 2019-02-20 (×3): 4 [IU] via SUBCUTANEOUS
  Administered 2019-02-21 (×2): 3 [IU] via SUBCUTANEOUS
  Administered 2019-02-21: 4 [IU] via SUBCUTANEOUS
  Administered 2019-02-21: 3 [IU] via SUBCUTANEOUS
  Administered 2019-02-22 (×2): 4 [IU] via SUBCUTANEOUS
  Administered 2019-02-22: 7 [IU] via SUBCUTANEOUS
  Administered 2019-02-22 – 2019-02-24 (×6): 4 [IU] via SUBCUTANEOUS
  Administered 2019-02-25: 3 [IU] via SUBCUTANEOUS
  Administered 2019-02-25: 4 [IU] via SUBCUTANEOUS
  Administered 2019-02-25: 3 [IU] via SUBCUTANEOUS
  Administered 2019-02-26: 4 [IU] via SUBCUTANEOUS
  Administered 2019-02-26: 3 [IU] via SUBCUTANEOUS
  Administered 2019-02-26: 4 [IU] via SUBCUTANEOUS
  Administered 2019-02-27 (×3): 3 [IU] via SUBCUTANEOUS
  Administered 2019-02-27: 4 [IU] via SUBCUTANEOUS
  Administered 2019-02-28: 3 [IU] via SUBCUTANEOUS
  Administered 2019-02-28: 7 [IU] via SUBCUTANEOUS
  Administered 2019-03-01: 3 [IU] via SUBCUTANEOUS
  Administered 2019-03-01: 4 [IU] via SUBCUTANEOUS
  Administered 2019-03-01: 3 [IU] via SUBCUTANEOUS
  Administered 2019-03-01: 7 [IU] via SUBCUTANEOUS
  Administered 2019-03-01: 3 [IU] via SUBCUTANEOUS
  Administered 2019-03-02: 7 [IU] via SUBCUTANEOUS
  Administered 2019-03-02: 3 [IU] via SUBCUTANEOUS
  Administered 2019-03-02: 4 [IU] via SUBCUTANEOUS
  Administered 2019-03-03: 7 [IU] via SUBCUTANEOUS
  Administered 2019-03-03 (×2): 3 [IU] via SUBCUTANEOUS
  Administered 2019-03-03 – 2019-03-04 (×2): 4 [IU] via SUBCUTANEOUS
  Administered 2019-03-04: 7 [IU] via SUBCUTANEOUS
  Administered 2019-03-04: 4 [IU] via SUBCUTANEOUS
  Administered 2019-03-05 – 2019-03-06 (×4): 3 [IU] via SUBCUTANEOUS
  Administered 2019-03-07: 4 [IU] via SUBCUTANEOUS
  Administered 2019-03-07: 3 [IU] via SUBCUTANEOUS
  Administered 2019-03-07 (×2): 4 [IU] via SUBCUTANEOUS
  Administered 2019-03-08 – 2019-03-09 (×2): 3 [IU] via SUBCUTANEOUS
  Administered 2019-03-09 – 2019-03-10 (×2): 4 [IU] via SUBCUTANEOUS

## 2019-02-17 MED ORDER — IPRATROPIUM-ALBUTEROL 0.5-2.5 (3) MG/3ML IN SOLN
3.0000 mL | Freq: Four times a day (QID) | RESPIRATORY_TRACT | Status: DC
  Administered 2019-02-17 (×2): 3 mL via RESPIRATORY_TRACT
  Filled 2019-02-17 (×2): qty 3

## 2019-02-17 NOTE — Consult Note (Signed)
ORTHOPAEDIC CONSULTATION  REQUESTING PHYSICIAN: Murlean Iba, MD  Chief Complaint: Dry gangrene bilateral transtibial amputations.  HPI: Gina [name] is a 58 y.o. female who presents with acute ischemic changes to both lower extremity transtibial amputations.  Patient has recently undergone surgical intervention for exploratory laparotomy with ruptured duodenal ulcer oversewn with a patch.  Patient did require pressors medications and has developed ischemic changes to the skin of both lower extremities.  Patient has extremely poor control diabetes with extremely poor malnutrition.  Past Medical History:  Diagnosis Date   Diabetes mellitus    Hypertension    Osteomyelitis of right foot ([place]) 02/22/2017   Schizophrenia ([place])    Septic arthritis of interphalangeal joint of toe of left foot ([place]) 06/02/2016   Stress incontinence 04/26/2017   Past Surgical History:  Procedure Laterality Date   AMPUTATION Left 06/05/2016   Procedure: LEFT FOOT TRANSMETATARSAL AMPUTATION;  Surgeon: [name], MD;  Location: [facility];  Service: Orthopedics;  Laterality: Left;   AMPUTATION Right 11/11/2017   Procedure: AMPUTATION BELOW KNEE;  Surgeon: [name], MD;  Location: [place];  Service: Orthopedics;  Laterality: Right;   AMPUTATION Left 05/10/2018   Procedure: LEFT BELOW KNEE AMPUTATION;  Surgeon: [name], MD;  Location: [place];  Service: Orthopedics;  Laterality: Left;   AMPUTATION Left 12/28/2018   Procedure: AMPUTATION THIRD DIGIT;  Surgeon: Iran Planas, MD;  Location: [facility];  Service: Orthopedics;  Laterality: Left;   AMPUTATION TOE Right 02/23/2017   Procedure: AMPUTATION RIGHT THIRD TOE;  Surgeon: [name], MD;  Location: [place];  Service: Orthopedics;  Laterality: Right;   I&D EXTREMITY Right 11/09/2017   Procedure: Debride Ulcer Right Heel;  Surgeon: [name], MD;  Location: [place];  Service: Orthopedics;  Laterality: Right;   I&D EXTREMITY Left  12/28/2018   Procedure: IRRIGATION AND DEBRIDEMENT EXTREMITY;  Surgeon: Iran Planas, MD;  Location: [facility];  Service: Orthopedics;  Laterality: Left;   INCISION AND DRAINAGE OF WOUND Left 12/26/2018   Procedure: IRRIGATION AND DEBRIDEMENT LEFT HAND;  Surgeon: Iran Planas, MD;  Location: [facility];  Service: Orthopedics;  Laterality: Left;   LAPAROTOMY N/A 02/02/2019   Procedure: EXPLORATORY LAPAROTOMY WITH OVERSEW OF DUODENAL ULCER;  Surgeon: Alphonsa Overall, MD;  Location: [facility];  Service: General;  Laterality: N/A;   TUBAL LIGATION     Social History   Socioeconomic History   Marital status: Single    Spouse name: Not on file   Number of children: Not on file   Years of education: Not on file   Highest education level: Not on file  Occupational History   Not on file  Social Needs   Financial resource strain: Not on file   Food insecurity    Worry: Not on file    Inability: Not on file   Transportation needs    Medical: Not on file    Non-medical: Not on file  Tobacco Use   Smoking status: Never Smoker   Smokeless tobacco: Never Used  Substance and Sexual Activity   Alcohol use: No    Comment: occasionally   Drug use: No   Sexual activity: Not Currently  Lifestyle   Physical activity    Days per week: Not on file    Minutes per session: Not on file   Stress: Not on file  Relationships   Social connections    Talks on phone: Not on file    Gets together: Not  on file    Attends religious service: Not on file    Active member of club or organization: Not on file    Attends meetings of clubs or organizations: Not on file    Relationship status: Not on file  Other Topics Concern   Not on file  Social History Narrative   Not on file   Family History  Problem Relation Age of Onset   Diabetes type II Father    CAD Father    Prostate cancer Father    Diabetes Mellitus II Brother    - negative except otherwise stated in the family history  section Allergies  Allergen Reactions   Bee Venom Swelling    Swells up with bee stings    Metformin And Related Other (See Comments)    Upset stomach   Prior to Admission medications   Medication Sig Start Date End Date Taking? Authorizing Provider  acetaminophen (TYLENOL) 325 MG tablet Take 2 tablets (650 mg total) by mouth every 6 (six) hours as needed for mild pain (or Fever >/= 101). 05/21/18  Yes Roney Jaffe, MD  escitalopram (LEXAPRO) 20 MG tablet TAKE ONE TABLET BY MOUTH EVERY DAY Patient taking differently: Take 20 mg by mouth daily.  04/09/18  Yes Charlott Rakes, MD  gabapentin (NEURONTIN) 100 MG capsule Take 2 capsules (200 mg total) by mouth 2 (two) times daily. 11/28/18 02/02/19 Yes Spongberg, Audie Pinto, MD  insulin aspart (NOVOLOG) 100 UNIT/ML injection Inject 0-15 Units into the skin 3 (three) times daily with meals. 05/21/18  Yes Roney Jaffe, MD  Insulin Glargine (LANTUS) 100 UNIT/ML Solostar Pen Inject 35 Units into the skin daily. 12/30/18  Yes Dhungel, Nishant, MD  Multiple Vitamin (MULTIVITAMIN WITH MINERALS) TABS tablet Take 1 tablet by mouth daily.   Yes [provider]  amLODipine (NORVASC) 5 MG tablet Take 1 tablet (5 mg total) by mouth daily. 11/29/18 12/29/18  Spongberg, Audie Pinto, MD  dextrose 50 % solution Inject 25 mLs (12.5 g total) into the vein as needed for low blood sugar. Patient not taking: Reported on 02/02/2019 05/21/18   Roney Jaffe, MD  doxycycline (VIBRA-TABS) 100 MG tablet Take 1 tablet (100 mg total) by mouth 2 (two) times daily. Patient not taking: Reported on 02/02/2019 12/30/18   Dhungel, Flonnie Overman, MD  polyethylene glycol (MIRALAX) packet Take 17 g by mouth daily as needed for mild constipation. Patient not taking: Reported on 02/02/2019 05/21/18   Roney Jaffe, MD  senna-docusate (SENOKOT-S) 8.6-50 MG tablet Take 2 tablets by mouth at bedtime as needed for mild constipation. Patient not taking: Reported on 02/02/2019  05/21/18   Roney Jaffe, MD   Dg Loyce Dys Tube Plc W/fl W/rad  Result Date: 02/17/2019 CLINICAL DATA:  Nasogastric tube placement. Recent gastric ulcer repair. Evaluate for leak. EXAM: NASO G TUBE PLACEMENT WITH FL AND WITH RAD; DG UGI W SINGLE CM CONTRAST:  16mL OMNIPAQUE IOHEXOL 300 MG/ML  SOLN FLUOROSCOPY TIME:  Fluoroscopy Time:  1 minutes 6 seconds Radiation Exposure Index (if provided by the fluoroscopic device): 11 mGy Number of Acquired Spot Images: 2 COMPARISON:  Upper GI 02/10/2019 and CT abdomen pelvis 02/10/2019. FINDINGS: Nasogastric tube was placed under fluoroscopic guidance with the tip positioned in the gastric cardia. Subsequently, 180 cc Omnipaque 300 was hand injected. No leak. Attempts were made to rotate the patient to help move contrast from the stomach into the proximal small bowel but, due to pain, she was not able to do so. No leak within the  opacified portions of the stomach. IMPRESSION: 1. Successful nasogastric tube placement under fluoroscopy. 2. Limited exam of the upper gastrointestinal tract without a leak in the opacified portions of the stomach. Please see discussion above. Electronically Signed   By: Lorin Picket M.D.   On: 02/17/2019 14:49   Dg Duanne Limerick W Single Cm (sol Or Thin Ba)  Result Date: 02/17/2019 CLINICAL DATA:  Nasogastric tube placement. Recent gastric ulcer repair. Evaluate for leak. EXAM: NASO G TUBE PLACEMENT WITH FL AND WITH RAD; DG UGI W SINGLE CM CONTRAST:  181mL OMNIPAQUE IOHEXOL 300 MG/ML  SOLN FLUOROSCOPY TIME:  Fluoroscopy Time:  1 minutes 6 seconds Radiation Exposure Index (if provided by the fluoroscopic device): 11 mGy Number of Acquired Spot Images: 2 COMPARISON:  Upper GI 02/10/2019 and CT abdomen pelvis 02/10/2019. FINDINGS: Nasogastric tube was placed under fluoroscopic guidance with the tip positioned in the gastric cardia. Subsequently, 180 cc Omnipaque 300 was hand injected. No leak. Attempts were made to rotate the patient to help move  contrast from the stomach into the proximal small bowel but, due to pain, she was not able to do so. No leak within the opacified portions of the stomach. IMPRESSION: 1. Successful nasogastric tube placement under fluoroscopy. 2. Limited exam of the upper gastrointestinal tract without a leak in the opacified portions of the stomach. Please see discussion above. Electronically Signed   By: Lorin Picket M.D.   On: 02/17/2019 14:49   - pertinent xrays, CT, MRI studies were reviewed and independently interpreted  Positive ROS: All other systems have been reviewed and were otherwise negative with the exception of those mentioned in the HPI and as above.  Physical Exam: General: Alert, no acute distress Psychiatric: Patient is competent for consent with normal mood and affect Lymphatic: No axillary or cervical lymphadenopathy Cardiovascular: No pedal edema Respiratory: No cyanosis, no use of accessory musculature GI: No organomegaly, abdomen is soft and non-tender    Images:  @ENCIMAGES @  Labs:  Lab Results  Component Value Date   HGBA1C 15.2 (H) 02/03/2019   HGBA1C >15.5 (H) 11/25/2018   HGBA1C 15.0 (H) 05/16/2018   ESRSEDRATE 105 (H) 05/09/2018   ESRSEDRATE 105 (H) 02/22/2017   ESRSEDRATE 114 (H) 06/01/2016   CRP 8.6 (H) 05/09/2018   CRP 7.7 (H) 02/23/2017   CRP 5.3 (H) 06/01/2016   LABURIC 3.8 06/04/2016   LABURIC 4.1 06/01/2016   REPTSTATUS 02/13/2019 FINAL 02/08/2019   GRAMSTAIN NO WBC SEEN RARE BUDDING YEAST SEEN  02/03/2019   CULT  02/08/2019    NO GROWTH 5 DAYS Performed at [facility], [place] [address], [place], [place] [address]    LABORGA KLEBSIELLA PNEUMONIAE (A) 02/02/2019    Lab Results  Component Value Date   ALBUMIN 1.2 (L) 02/17/2019   ALBUMIN 1.2 (L) 02/16/2019   ALBUMIN 1.2 (L) 02/14/2019   PREALBUMIN 5.3 (L) 02/17/2019   PREALBUMIN 5.2 (L) 02/14/2019   PREALBUMIN <5 (L) 02/10/2019   LABURIC 3.8 06/04/2016   LABURIC 4.1 06/01/2016     Neurologic: Patient does not have protective sensation bilateral lower extremities.   MUSCULOSKELETAL:   Skin: Examination patient has mottled ischemic changes to both transtibial amputations with mottled ischemic changes up to the patella bilaterally.  Patient's legs are warm and her macro circulation is intact with femoral pulses.    Patient has severe protein caloric malnutrition with an albumin of 1.2 and severely uncontrolled type 2 diabetes with a hemoglobin A1c consistently greater than 15.  Assessment: Assessment:  Severely uncontrolled diabetes with severe protein caloric malnutrition postoperative from ruptured duodenal ulcer and surgical intervention, with dry gangrenous changes of bilateral transtibial amputations.  Plan: Plan: At this point patient is not stable for further surgical intervention and observation of the dry gangrenous changes is recommended for both transtibial amputations.  When patient stabilizes the legs may improve and not require bilateral AKA, or she may require above-the-knee amputations bilaterally.  No urgent surgical intervention is necessary at this time.  Please consult if patient develops any cellulitis or dehiscence of the BKAs bilaterally.  Thank you for the consult and the opportunity to see [name], MD [facility] [phone] 3:48 PM

## 2019-02-17 NOTE — Progress Notes (Signed)
Chaplain consulted with pt while rounding due to LOS.  Provided support at bedside.  Communication limited due to difficulty hearing pt, breathing treatment.   Gina [name] noted that she is fearful of being homeless.  Reported she had been homeless 3 times prior.  Also states she is fearful of "going to a nursing home."  She is welcoming of support and welcomed prayers at bedside.  She affirms she would like chaplain follow up for continued support.    This chaplain will follow for continued assessment and support.

## 2019-02-17 NOTE — Progress Notes (Signed)
SLP Cancellation Note  Patient Details Name: Gina [name] MRN: [id number] DOB: 03-Sep-1960   Cancelled treatment:       Reason Eval/Treat Not Completed: Other (comment);Medical issues which prohibited therapy(pt now npo per surgery from documentation, will continue efforts)   Macario Golds 02/17/2019, 8:19 AM  Luanna Salk, [facility] SLP Acute Rehab Services Pager [phone] Office [phone]

## 2019-02-17 NOTE — Progress Notes (Addendum)
PROGRESS NOTE    Gina [name]   VHQ:[id number]  DOB: 1960/05/16  DOA: 02/02/2019 PCP: [name], MD   Brief Narrative:  Patient is a 58 year old female with past medical history significant for poorly controlled diabetes mellitus type 2 with diabetic foot ulcers status post bilateral BKA, recent left phalanx amputation (10/17/ 2020) , HTN and schizophrenia.  Patient was admitted with altered mental status, nausea, vomiting and abdominal pain. In the ED, patient was hypothermic and severely hypotensive. Initial blood glucose was greater than 1000.  CT abdomen showed pneumoperitoneum and mesenteric ischemia.  Patient was initially admitted to ICU service and started on IV antibiotics.  Patient was also on pressors at some point.  On 02/03/2019, patient underwent exploratory Laparotomy with finding of perforated prepyloric ulcer which was oversewn and 3.1 L of contaminated peritoneal fluid.  Patient was extubated on 02/09/2019.  Hospital course has been complicated by by DKA, AKI, acute blood loss anemia and acute thrombocytopenia, intra-abdominal sepsis and fungemia.  Patient was transferred out of ICU to [facility] on 02/10/19. General surgery is also following patient.  On 03/07/2019, NG tube was pulled.  Patient is currently on ice chips, and oral intake is been titrated as tolerated.  Patient remains on TPN at 50 cc/h. Will await speech evaluation.  With severe sepsis, hypotension and use of pressors, patient may have developed ischemic changes to bilateral BKA.  Vascular surgery has been consulted.  Dr. Sharol Given consulted 02/17/19 for possible above-knee amputation.  Subjective: Pt denies pain or discomfort.   Assessment & Plan: Septic shock due to ruptured gastric ulcer with acute peritonitis, fungemia and UTI - ID consulted on 02/04/19 - Blood cultures from 11/22> Candida Glabrata- repeat blood culture 11/24 NGTD - Urine culture 11/22 > K pneumoniae - Peritoneal fluid 11/23>  Candida albicans - Opthalmology consulted - no evidence of fungal endophthalmitis  - currently receiving Andulafungin and Zosyn - ID has recommended to remove cental line to give line holiday and right IJ was removed on 12/1 - placed PICC 12/3- Gen surgery resumed TPN and plans to clamp NG today - she is noted to have watery stool for the past few days and has been-  requiring a Flexi-Seal -02/15/2019: Sepsis physiology has resolved significantly.  Infectious disease team is managing fungemia and intra-abdominal infection with anidulafungin (day 5 of 14) and Zosyn (day 13 of 14). -02/16/2019: Patient has completed course of IV Zosyn.  Continue anidulafungin.  Vascular surgery input is appreciated (please see the consult note).  Patient may eventually need bilateral above-knee amputation.  Underwent amputation of the left middle finger by Dr. Gavin Pound, orthopedic surgeon, on 12/28/2018.  Please consult Dr. Apolonio Schneiders in the morning to reviewed the left hand and to remove the stitches. -02/17/19 - consult to Dr. Sharol Given regarding bilateral AKA.  Palliative consult for goals of care.   Acute kidney injury - improving - baseline Cr is < 1.0 - Cr rose to 2.66 on 11/26 and is steadily improving - foley cath removed on 11/30-urine output is adequate  Acute delirium - her only psych medication was Lexapro which has been on hold as she has been NPO - acute confusion, hallucinations, paranoia anxiety noted on the first day that I evaluated her (11/30) - CT head ordered and is negative - Checked UA for underlying infection (already received Zosyn)- UA shows rare bacteria but does show yeast and WBC which is not unexpected considering her current candidal infection  - B12 is not low (elevated) -  Ammonia < 9 - - have asked for a psych eval-as needed Haldol recommended however we have not needed this as she is not severely agitated -Resume Lexapro once able to tolerate orals 02/16/2019: Resolving.  Continue to  manage expectantly. 02/17/2019: mentation continues to improve.     DKA (diabetic ketoacidoses), severely uncontrolled with hyperglycemia  - resolved - A1c on 11/23>  15.2 - Uses Lantus and Novolog as outpt- currently on sliding scale insulin and NPO 03/07/2019: DKA has resolved.  Continue to optimize blood sugar control. CBG (last 3)  Recent Labs    02/16/19 1806 02/16/19 2331 02/17/19 0525  GLUCAP 330* 231* 166*   Hypokalemia - repleted, following  Severe protein calorie malnutrition with hypoalbuminemia - NPO at this time and receiving TPN 02/15/2019: Patient still on TPN.  NG tube has been discontinued.  Currently on ice chips. -SLP is following and will assist in diet advancement.   Anemia due to acute blood loss -  s/p 1 U PRBC on 11/28 for Hb of 6.1 -  remains slightly anemic with Hb ~ 7 -  anemia panel shows low normal Iron stores, Normal folate and elevated B12 -  transfused for Hb persistently < 7 02/15/2019: Repeat CBC in the morning. 02/16/2019: Hg  is 6.1 g/dL today.  Patient be transfused with 1 unit of packed red blood cells.  Continue to monitor H&H closely. 02/17/2019: Hg up to 7.6.   Acute thrombocytopenia - related to septic shock - nadir was > 31 on 11/27 - Platelets have risen back to normal  Blebs and mottling on distal extremities - noted to have dusky skin with large blebs (not fluid filled) on stumps and hands-   - I am not sure how long these have been present but I noted them on the day she was transferred to our service-- per RN who was taking care of her during the time they developed, it occurred after Levophed was started (? If she had ischemia from vasoconstriction) - extremities are warm and appear to be perfusing well - blebs do not appear to be progressing and actually are resolving but skin breakdown is occurring- local wound care being done- Wound care RN also consulted to give input 02/15/2019: We will have a low threshold to consult vascular  surgery team. 02/16/2019: Currently see above.  Patient has been seen by the vascular surgery team.  Patient with likely need above-knee amputation bilaterally.  Patient will need to follow-up with Dr. Sharol Given, who performed the BKA. 02/17/2019: Requested consult with Dr. Sharol Given.   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  I spoke with Dr. Caralyn Guile who said that RN can remove sutures and place picture in chart and he will review.    B/ l BKA   Time spent in minutes: 35 min  DVT prophylaxis: heparin Code Status: Full code Family Communication:  Disposition Plan: This will depend on hospital course. Consultants:   General surgery  ID Procedures:   Intubation/ Extubation  Ex lap Antimicrobials:  Anti-infectives (From admission, onward)   Start     Dose/Rate Route Frequency Ordered Stop   02/05/19 1000  anidulafungin (ERAXIS) 100 mg in sodium chloride 0.9 % 100 mL IVPB     100 mg 78 mL/hr over 100 Minutes Intravenous Every 24 hours 02/04/19 0840     02/04/19 1000  anidulafungin (ERAXIS) 200 mg  in sodium chloride 0.9 % 200 mL IVPB     200 mg 78 mL/hr over 200 Minutes Intravenous  Once 02/04/19 0840 02/04/19 1301   02/04/19 0200  fluconazole (DIFLUCAN) IVPB 200 mg  Status:  Discontinued     200 mg 100 mL/hr over 60 Minutes Intravenous Every 24 hours 02/02/19 2302 02/04/19 0840   02/03/19 1800  vancomycin (VANCOCIN) IVPB 750 mg/150 ml premix  Status:  Discontinued     750 mg 150 mL/hr over 60 Minutes Intravenous Every 24 hours 02/03/19 0241 02/03/19 0242   02/03/19 1800  vancomycin (VANCOCIN) IVPB 750 mg/150 ml premix  Status:  Discontinued     750 mg 150 mL/hr over 60 Minutes Intravenous Every 24 hours 02/03/19 0242 02/03/19 0858   02/03/19 0600  piperacillin-tazobactam (ZOSYN) IVPB 3.375 g     3.375 g 12.5 mL/hr over 240 Minutes Intravenous Every 8 hours 02/03/19  0243 02/17/19 0205   02/02/19 2315  fluconazole (DIFLUCAN) IVPB 400 mg     400 mg 100 mL/hr over 120 Minutes Intravenous STAT 02/02/19 2301 02/03/19 0218   02/02/19 1800  ceFEPIme (MAXIPIME) 2 g in sodium chloride 0.9 % 100 mL IVPB     2 g 200 mL/hr over 30 Minutes Intravenous  Once 02/02/19 1745 02/02/19 2030   02/02/19 1800  metroNIDAZOLE (FLAGYL) IVPB 500 mg     500 mg 100 mL/hr over 60 Minutes Intravenous  Once 02/02/19 1745 02/02/19 2237   02/02/19 1800  vancomycin (VANCOCIN) IVPB 1000 mg/200 mL premix  Status:  Discontinued     1,000 mg 200 mL/hr over 60 Minutes Intravenous  Once 02/02/19 1745 02/02/19 1747   02/02/19 1800  vancomycin (VANCOCIN) 1,250 mg in sodium chloride 0.9 % 250 mL IVPB     1,250 mg 166.7 mL/hr over 90 Minutes Intravenous  Once 02/02/19 1747 02/02/19 2237      Objective: Vitals:   02/17/19 0400 02/17/19 0500 02/17/19 0600 02/17/19 0800  BP: 110/64  (!) 113/51 (!) 124/56  Pulse: (!) 105  (!) 101 (!) 103  Resp: 15  (!) 0 15  Temp: 98.7 F (37.1 C)   99.1 F (37.3 C)  TempSrc: Oral   Oral  SpO2: 98%     Weight:  61.9 kg    Height:        Intake/Output Summary (Last 24 hours) at 02/17/2019 0932 Last data filed at 02/17/2019 6811 Gross per 24 hour  Intake 2955.77 ml  Output 2370 ml  Net 585.77 ml   Filed Weights   02/15/19 0540 02/16/19 0530 02/17/19 0500  Weight: 57.5 kg 59.4 kg 61.9 kg    Examination: General exam: patient is awake, oriented to person and place, no apparent distress, appears chronically ill.  HEENT:  NCAT, MMM, pale.  Respiratory system: Clear to auscultation. Respiratory effort normal. Cardiovascular system: normal S1 & S2 sounds.  Gastrointestinal system: NDNT, no masses.  Flexi-Seal present Central nervous system:  Awake and alert.  Patient moves all extremities.   Extremities: Bilateral BKA with necrosis seen at bilateral stumps.  Skin: necrosis at both stumps.   Data Reviewed: I have personally reviewed following labs  and imaging studies  CBC: Recent Labs  Lab 02/11/19 0431 02/13/19 0211 02/14/19 0209 02/16/19 0254 02/16/19 1809 02/17/19 0522  WBC 6.6 5.2 5.5 5.2  --  7.2  NEUTROABS  --   --  3.9 3.5  --  4.5  HGB 7.2* 7.7* 7.4* 6.1* 7.9* 7.6*  HCT 22.8* 25.3* 24.6* 19.9* 25.0*  24.8*  MCV 91.6 94.4 94.3 94.3  --  94.3  PLT 161 282 296 338  --  573   Basic Metabolic Panel: Recent Labs  Lab 02/11/19 0431 02/11/19 0644  02/13/19 0211 02/14/19 0209 02/15/19 0158 02/16/19 0254 02/17/19 0522  NA 136  --    < > 140 142 139 138  138 136  K 3.4*  --    < > 3.9 4.0 4.1 4.2  4.2 4.6  CL 103  --    < > 110 111 111 109  109 106  CO2 24  --    < > 23 21* 21* 21*  21* 19*  GLUCOSE 199*  --    < > 107* 123* 200* 253*  253* 196*  BUN 33*  --    < > 26* 23* 24* 30*  30* 32*  CREATININE 1.50*  --    < > 1.27* 1.08* 1.22* 1.23*  1.34* 1.24*  CALCIUM 7.5*  --    < > 7.6* 7.7* 7.4* 7.9*  7.9* 8.3*  MG 2.0  --   --   --  1.8 1.8 1.9 1.8  PHOS  --  3.0  --   --  3.0 2.8 2.6  2.6 2.6   < > = values in this interval not displayed.   GFR: Estimated Creatinine Clearance: 43.3 mL/min (A) (by C-G formula based on SCr of 1.24 mg/dL (H)). Liver Function Tests: Recent Labs  Lab 02/14/19 0209 02/16/19 0254 02/17/19 0522  AST 9*  --  9*  ALT 12  --  9  ALKPHOS 56  --  54  BILITOT 0.6  --  0.4  PROT 4.9*  --  5.8*  ALBUMIN 1.2* 1.2* 1.2*   No results for input(s): LIPASE, AMYLASE in the last 168 hours. Recent Labs  Lab 02/12/19 1419  AMMONIA <9*   Coagulation Profile: No results for input(s): INR, PROTIME in the last 168 hours. Cardiac Enzymes: No results for input(s): CKTOTAL, CKMB, CKMBINDEX, TROPONINI in the last 168 hours. BNP (last 3 results) No results for input(s): PROBNP in the last 8760 hours. HbA1C: No results for input(s): HGBA1C in the last 72 hours. CBG: Recent Labs  Lab 02/16/19 0523 02/16/19 1201 02/16/19 1806 02/16/19 2331 02/17/19 0525  GLUCAP 238* 275* 330* 231*  166*   Lipid Profile: Recent Labs    02/17/19 0522  TRIG 116   Thyroid Function Tests: No results for input(s): TSH, T4TOTAL, FREET4, T3FREE, THYROIDAB in the last 72 hours. Anemia Panel: No results for input(s): VITAMINB12, FOLATE, FERRITIN, TIBC, IRON, RETICCTPCT in the last 72 hours. Urine analysis:    Component Value Date/Time   COLORURINE YELLOW 02/12/2019 1401   APPEARANCEUR CLOUDY (A) 02/12/2019 1401   LABSPEC 1.010 02/12/2019 1401   PHURINE 5.0 02/12/2019 1401   GLUCOSEU >=500 (A) 02/12/2019 1401   HGBUR MODERATE (A) 02/12/2019 1401   BILIRUBINUR NEGATIVE 02/12/2019 1401   BILIRUBINUR N 04/28/2016 1603   KETONESUR NEGATIVE 02/12/2019 1401   PROTEINUR 30 (A) 02/12/2019 1401   UROBILINOGEN 0.2 04/28/2016 1603   UROBILINOGEN 0.2 06/18/2013 0650   NITRITE NEGATIVE 02/12/2019 1401   LEUKOCYTESUR NEGATIVE 02/12/2019 1401   Recent Results (from the past 240 hour(s))  Culture, blood (routine x 2)     Status: None   Collection Time: 02/08/19 12:13 PM   Specimen: BLOOD  Result Value Ref Range Status   Specimen Description   Final    BLOOD RIGHT WRIST Performed at [facility], [place] [address], [place], [place] [address]    Special Requests   Final    BOTTLES DRAWN AEROBIC AND ANAEROBIC Blood Culture adequate volume Performed at [facility] [address], [place], [place] [address]    Culture   Final    NO GROWTH 5 DAYS Performed at [facility], [place] [address], [place], [place] [address]    Report Status 02/13/2019 FINAL  Final  Culture, blood (routine x 2)     Status: None   Collection Time: 02/08/19 12:28 PM   Specimen: BLOOD  Result Value Ref Range Status   Specimen Description   Final    BLOOD RIGHT ARM Performed at [facility] [address], [place], [place] [address]    Special Requests   Final    BOTTLES DRAWN AEROBIC ONLY Blood Culture results may not be optimal due to an inadequate  volume of blood received in culture bottles Performed at [facility] [address], [place], [place] [address]    Culture   Final    NO GROWTH 5 DAYS Performed at [facility], [place] [address] [place],  [address]    Report Status 02/13/2019 FINAL  Final    Radiology Studies: No results found.  Scheduled Meds: . sodium chloride   Intravenous Once  . chlorhexidine gluconate (MEDLINE KIT)  15 mL Mouth Rinse BID  . Chlorhexidine Gluconate Cloth  6 each Topical Daily  . Gerhardt's butt cream   Topical TID  . heparin injection (subcutaneous)  5,000 Units Subcutaneous Q8H  . insulin aspart  0-15 Units Subcutaneous Q6H  . pantoprazole (PROTONIX) IV  40 mg Intravenous Q12H  . sodium chloride flush  10-40 mL Intracatheter Q12H  . sucralfate  1 g Oral TID WC & HS   Continuous Infusions: . sodium chloride 50 mL/hr at 02/17/19 0906  . sodium chloride Stopped (02/13/19 2137)  . anidulafungin Stopped (02/16/19 1100)  . TPN ADULT (ION) 50 mL/hr at 02/17/19 0906     LOS: 15 days   Critical Care Procedure Note Authorized and Performed by: Murvin Natal MD  Total Critical Care time:  30 minutes Due to a high probability of clinically significant, life threatening deterioration, the patient required my highest level of preparedness to intervene emergently and I personally spent this critical care time directly and personally managing the patient.  This critical care time included obtaining a history; examining the patient, pulse oximetry; ordering and review of studies; arranging urgent treatment with development of a management plan; evaluation of patient's response of treatment; frequent reassessment; and discussions with other providers.  This critical care time was performed to assess and manage the high probability of imminent and life threatening deterioration that could result in multi-organ failure.  It was exclusive of separately billable procedures and  treating other patients and teaching time.    Irwin Brakeman, MD How to contact the [facility] Attending or Consulting provider [place] or covering provider during after hours [place], for this patient?  1. Check the care team in [facility] and look for a) attending/consulting [facility] provider listed and b) the [facility] team listed 2. Log into [email or web address] and use [place] universal password to access. If you do not have the password, please contact the hospital operator. 3. Locate the [facility] provider you are looking for under [facility] and page to a number that you can be directly reached. 4. If you still have difficulty  reaching the provider, please page the [facility] (Director on Call) for the Hospitalists listed on amion for assistance.  02/17/2019, 9:32 AM

## 2019-02-17 NOTE — Progress Notes (Signed)
PHARMACY - TOTAL PARENTERAL NUTRITION CONSULT NOTE   Indication: Small bowel obstruction  Patient Measurements: Height: 5\' 2"  (157.5 cm) Weight: 136 lb 7.4 oz (61.9 kg) IBW/kg (Calculated) : 50.1 TPN AdjBW (KG): 57.9 Body mass index is 24.96 kg/m. Usual Weight: 55-60 kg  Assessment:  [name] with poorly controlled DM s/p BL BKA, admitted 11/23 with AMS, n/v, abdominal pain. CT abdomen showed pneumoperitoneum and mesenteric ischemia. Underwent ex lap on 11/23 with finding of perforated prepyloric ulcer which was oversewn, along with gross contamination of the peritoneum. Hospital course complicated by DKA, AKI, acute thrombocytopenia, and C. glabrata candidemia. Patient remains with high NGT d/t bowel edema as demonstrated on UGI. TPN started on 11/30 but was stopped shortly thereafter to facilitate central line holiday for candidemia. Repeat blood cultures remain negative to date, and TPN now restarting with replacement of central line. Patient at risk for refeeding given prolonged lack of nutrition.  Glucose / Insulin: Hx DM; prev required D10 W infusion to maintain euglycemia  Current moderate Novolog scale   q6h (12/5)   CBGs elevated, 27 units of Novolog  past 24 hr, 20 units of regular insulin added to TPN  Electrolytes: wnl exc Bicarb is 19; corrected Ca 10.5, WNL  Renal: AKI on admit, SCr 1.24 today LFTs / TGs: WNL (11/30), 126 (12/4), 116 ( 12/7)  Prealbumin / albumin: both low (11/30), 5.2 (12/4). 5.3 ( 12/7)  I/O: Variable NG output,  1980ml out since from JP drain, 600 ml UOP MIVF: 1/2 NS at 50 ml/hr GI Imaging: 11/30 UGI: high NG output & bowel edema Surgeries / Procedures: 11/22 ex lap: oversew of duodenal ulcer with [name] patch  Central access: PICC replaced 12/3  TPN start date: resumed 12/3 (~ 2200)  Nutritional Goals (per RD recommendation on 11/30): Kcal:  2025-2330 kcal Protein:  115-130 grams Fluid:  >/= 2 L/day Goal TPN rate is 90 mL/hr (provides 129 g of  protein and 2047 kcals per day) - GIR 3.3 mg/kg/min  Current Nutrition:  Begin Bariatric CL diet - avoid increasing blood sugars  Plan: at 1800  Increase custom TPN to 70 mL/hr   Electrolytes in TPN:  70mEq/L Na, 45 mEq/L K,(decr) 77mEq/L Ca, Magnesium 6 mEq/L ( inc) , and 78mmol/L of Phos; continue Cl:Ac ratio 1:2  Standard MVI and trace elements to TPN  Increase  SSI to resistant with q6h CBG checks and adjust as needed   Add 25 units Novolog to TPN  Decrease 1/2 NS to 30 ml/hr  Monitor TPN labs on Mon/Thurs  BMET, Mag, Phos in am  Royetta Asal, PharmD, BCPS 02/17/2019 9:02 AM

## 2019-02-17 NOTE — Progress Notes (Signed)
PT Cancellation Note  Patient Details Name: Gina [name] MRN: [id number] DOB: [birth year]   Cancelled Treatment:    Reason Eval/Treat Not Completed: Patient at procedure or test/unavailable Pt currently out of room for procedure.  RN reports pt's cognition remains confused when awake.  Pt also with consult for possible bil AKAs.  Recommend palliative consult.  Physical therapy to check back later this week.   Analise Glotfelty,KATHrine E 02/17/2019, 2:33 PM Carmelia Bake, PT, DPT Acute Rehabilitation Services Office: [phone] Pager: [phone]

## 2019-02-17 NOTE — Consult Note (Addendum)
WOC Nurse Consult Note: Reason for Consult:Unstageable PrI to sacrum Wound type:Pressure Pressure Injury POA: Yes Measurement: 11cm x 8cm Wound bed:90% firmly adherent black eschar, 10% red at periphery Drainage (amount, consistency, odor) none Periwound: intact Dressing procedure/placement/frequency: Patient is on a low air loss mattress replacement in the ICU, has ischemic to bilateral BKAs and has been seen by vascular.  Surgery following. I notified Saverio Danker, CCS PA of sacral wound.  [facility] nursing team will not follow, but will remain available to this patient, the nursing and medical teams.  Please reconsult if needed. Thanks, Maudie Flakes, MSN, RN, [place], Arther Abbott  Pager# [phone]

## 2019-02-17 NOTE — Progress Notes (Signed)
Nutrition Follow-up  DOCUMENTATION CODES:   Not applicable  INTERVENTION:  - continue TPN regimen/TPN advancement per Pharmacy. - will monitor for NGT replacement and output from this tube.    NUTRITION DIAGNOSIS:   Inadequate oral intake related to altered GI function(pyloric channel ulcer perforation s/p ex lap) as evidenced by NPO status. -ongoing  GOAL:   Patient will meet greater than or equal to 90% of their needs -met with TPN regimen  MONITOR:   Diet advancement, Labs, Weight trends, Skin, Other (Comment)(TPN regimen)  ASSESSMENT:   58 year old female with past medical history of HTN, schizophrenia, poorly controlled T2DM with diabetic foot wounds s/p bilateral BKA, and recent left phalanx amputation, who presented to ED via EMS with AMS, nausea, vomiting and abdominal pain. Patient accompanied by daughter who reports patient has been complaining of "flu-like symptoms" for the past few days endorsing fever and chills. Patient lives alone, but has a caretaker that lives nearby and found patient on the floor of her home very altered. In ED patient found to be hypothermic and severely hypotensive, BG >1000, CT a/p showed findings concerning for mesenteric ischemia.  Significant Events: 11/22- admission 11/23- ex lap; over-sewing of duodenal ulcer with graham patch; pyloric channel ulcer perf; NGT placed 11/29- extubation; NGT replaced 11/30- plan for initiation of TPN 12/1- TPN held 12/3- TPN re-started 12/5- NGT removed    Diet was advanced from NPO to CLD yesterday at 0277 but then returned to NPO at [place]. No intakes documented during the hours she was on CLD. Patient resting in bed and did not awake during RD visit. No family visitors present. Per chart review, weight trending back up.  She is currently receiving custom TPN at 50 ml/hr. Per Pharmacist's note this AM, plan to increase custom TPN to 70 ml/hr today at 1800. Goal rate for TPN: 90 ml/hr to provide 2047 kcal,  129 grams protein.   SLP attempted to see patient today but was unable due to patient being NPO. Surgery team note states that she has significant output from JP drain that just started overnight. State that patient does not talk or engage in conversation today. Head CT was negative. Plan for Diagnostic Radiology to replace NGT today (to mitigate risk of tube going through hole in stomach) and plan for UGI to confirm leak location.    Labs reviewed; CBG: 166 mg/dl, BUN: 32 mg/dl, creatinine: 1.24 mg/dl, Ca: 8.3 mg/dl, AST low, GFR: 56 ml/min, triglycerides: 116 mg/dl today.  Medications reviewed; sliding scale novolog, 1 g carafate TID, 40 mg IV protonix BID.  IVF; 1/2 NS @ 50 ml/hr.   Diet Order:   Diet Order            Diet NPO time specified  Diet effective now              EDUCATION NEEDS:   No education needs have been identified at this time  Skin:  Skin Assessment: Skin Integrity Issues: Skin Integrity Issues:: Stage II, Unstageable, Incisions Stage II: L buttocks Unstageable: full thickness coccyx Incisions: abdomen (11/23)  Last BM:  12/6  Height:   Ht Readings from Last 1 Encounters:  02/07/19 5' 2"  (1.575 m)    Weight:   Wt Readings from Last 1 Encounters:  02/17/19 61.9 kg    Ideal Body Weight:  44.2 kg(Adjusted IBW for bilateral BKA)  BMI:  Body mass index is 24.96 kg/m.  Estimated Nutritional Needs:   Kcal:  2025-2330 kcal  Protein:  115-130 grams  Fluid:  >/= 2 L/day     Jarome Matin, MS, RD, LDN, [facility] Inpatient Clinical Dietitian Pager # [phone] After hours/weekend pager # [phone]

## 2019-02-17 NOTE — Progress Notes (Signed)
Patient ID: Gina [name], female   DOB: [birth year], 58 y.o.   MRN: [id number]    15 Days Post-Op  Subjective: Patient with significant bilious output from JP drain that just started overnight. Will wake up some, but still doesn't talk and engage in much conversation  ROS: unable due to lethargy  Objective: Vital signs in last 24 hours: Temp:  [98 F (36.7 C)-100.7 F (38.2 C)] 98.7 F (37.1 C) (12/07 0400) Pulse Rate:  [95-113] 101 (12/07 0600) Resp:  [0-28] 0 (12/07 0600) BP: (110-145)/(51-70) 113/51 (12/07 0600) SpO2:  [98 %-100 %] 98 % (12/07 0400) Weight:  [61.9 kg] 61.9 kg (12/07 0500) Last BM Date: 02/16/19  Intake/Output from previous day: 12/06 0701 - 12/07 0700 In: 4202.1 [I.V.:3515.8; Blood:307.1; IV Piggyback:379.3] Out: 2920 [Urine:600; Drains:1870; Stool:450] Intake/Output this shift: No intake/output data recorded.  PE: Abd: soft, NT, ND, +BS, midline wound is clean, JP drain with copious bilious output.  Lab Results:  Recent Labs    02/16/19 0254 02/16/19 1809 02/17/19 0522  WBC 5.2  --  7.2  HGB 6.1* 7.9* 7.6*  HCT 19.9* 25.0* 24.8*  PLT 338  --  341   BMET Recent Labs    02/16/19 0254 02/17/19 0522  NA 138  138 136  K 4.2  4.2 4.6  CL 109  109 106  CO2 21*  21* 19*  GLUCOSE 253*  253* 196*  BUN 30*  30* 32*  CREATININE 1.23*  1.34* 1.24*  CALCIUM 7.9*  7.9* 8.3*   PT/INR No results for input(s): LABPROT, INR in the last 72 hours. CMP     Component Value Date/Time   NA 136 02/17/2019 0522   NA 139 10/04/2017 1705   K 4.6 02/17/2019 0522   CL 106 02/17/2019 0522   CO2 19 (L) 02/17/2019 0522   GLUCOSE 196 (H) 02/17/2019 0522   BUN 32 (H) 02/17/2019 0522   BUN 11 10/04/2017 1705   CREATININE 1.24 (H) 02/17/2019 0522   CREATININE 0.68 04/28/2016 1640   CALCIUM 8.3 (L) 02/17/2019 0522   PROT 5.8 (L) 02/17/2019 0522   PROT 7.1 10/04/2017 1705   ALBUMIN 1.2 (L) 02/17/2019 0522   ALBUMIN 3.7 10/04/2017 1705   AST 9 (L)  02/17/2019 0522   ALT 9 02/17/2019 0522   ALKPHOS 54 02/17/2019 0522   BILITOT 0.4 02/17/2019 0522   BILITOT <0.2 10/04/2017 1705   GFRNONAA 48 (L) 02/17/2019 0522   GFRNONAA >89 04/28/2016 1640   GFRAA 56 (L) 02/17/2019 0522   GFRAA >89 04/28/2016 1640   Lipase     Component Value Date/Time   LIPASE 27 04/01/2016 1040       Studies/Results: No results found.  Anti-infectives: Anti-infectives (From admission, onward)   Start     Dose/Rate Route Frequency Ordered Stop   02/05/19 1000  anidulafungin (ERAXIS) 100 mg in sodium chloride 0.9 % 100 mL IVPB     100 mg 78 mL/hr over 100 Minutes Intravenous Every 24 hours 02/04/19 0840     02/04/19 1000  anidulafungin (ERAXIS) 200 mg in sodium chloride 0.9 % 200 mL IVPB     200 mg 78 mL/hr over 200 Minutes Intravenous  Once 02/04/19 0840 02/04/19 1301   02/04/19 0200  fluconazole (DIFLUCAN) IVPB 200 mg  Status:  Discontinued     200 mg 100 mL/hr over 60 Minutes Intravenous Every 24 hours 02/02/19 2302 02/04/19 0840   02/03/19 1800  vancomycin (VANCOCIN) IVPB 750 mg/150 ml premix  Status:  Discontinued     750 mg 150 mL/hr over 60 Minutes Intravenous Every 24 hours 02/03/19 0241 02/03/19 0242   02/03/19 1800  vancomycin (VANCOCIN) IVPB 750 mg/150 ml premix  Status:  Discontinued     750 mg 150 mL/hr over 60 Minutes Intravenous Every 24 hours 02/03/19 0242 02/03/19 0858   02/03/19 0600  piperacillin-tazobactam (ZOSYN) IVPB 3.375 g     3.375 g 12.5 mL/hr over 240 Minutes Intravenous Every 8 hours 02/03/19 0243 02/17/19 0205   02/02/19 2315  fluconazole (DIFLUCAN) IVPB 400 mg     400 mg 100 mL/hr over 120 Minutes Intravenous STAT 02/02/19 2301 02/03/19 0218   02/02/19 1800  ceFEPIme (MAXIPIME) 2 g in sodium chloride 0.9 % 100 mL IVPB     2 g 200 mL/hr over 30 Minutes Intravenous  Once 02/02/19 1745 02/02/19 2030   02/02/19 1800  metroNIDAZOLE (FLAGYL) IVPB 500 mg     500 mg 100 mL/hr over 60 Minutes Intravenous  Once 02/02/19  1745 02/02/19 2237   02/02/19 1800  vancomycin (VANCOCIN) IVPB 1000 mg/200 mL premix  Status:  Discontinued     1,000 mg 200 mL/hr over 60 Minutes Intravenous  Once 02/02/19 1745 02/02/19 1747   02/02/19 1800  vancomycin (VANCOCIN) 1,250 mg in sodium chloride 0.9 % 250 mL IVPB     1,250 mg 166.7 mL/hr over 90 Minutes Intravenous  Once 02/02/19 1747 02/02/19 2237       Assessment/Plan VDRF- extubated 11/29 Poorly controlled DM2- A1c15.2 S/p bilateral BKA Ischemic changes to both LE- defer to medicine, but would benefit from eval from Dr. 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 Lucia Gaskins, for perforated prepyloric ulcer with gross contamination -now with apparent bile leak, likely from ulcer site.  Cont JP drain.  Will have radiology replace NGT to minimize risk of tube going through the stomach hole in placement and obtain an UGI to confirm location of leak. -NPO - BID wet to dry dressing changes to midline abdominal wound - BID PPI; added carafate -pulm toilet and IS -OOB to chair as much as possible -mobilize, PT/OT -cont TNA for now for nutritional support until oral intake adequate. -as of right now AF and normal WBC.  No need to restarted abx currently as leak appears well controlled to the drain and source is controlled.  If she starts showing signs otherwise, may need zosyn restarted, but not necessary right now.  ID - Eraxis 11/24>>Zosyn 11/23 >>12/6 FEN - NPO/ Rad to replace NGT VTE - NoSCDs,Bilateral BKa's - sq heparin  Foley -out Follow up -Dr. Lucia Gaskins   LOS: 15 days    Henreitta Cea , [facility]  02/17/2019, 8:16 AM Please see Amion for pager number during day hours 7:00am-4:30pm

## 2019-02-18 ENCOUNTER — Inpatient Hospital Stay (HOSPITAL_COMMUNITY): Payer: Medicare HMO

## 2019-02-18 LAB — CBC WITH DIFFERENTIAL/PLATELET
Abs Immature Granulocytes: 0.18 10*3/uL — ABNORMAL HIGH (ref 0.00–0.07)
Basophils Absolute: 0 10*3/uL (ref 0.0–0.1)
Basophils Relative: 0 %
Eosinophils Absolute: 0.1 10*3/uL (ref 0.0–0.5)
Eosinophils Relative: 1 %
HCT: 25.3 % — ABNORMAL LOW (ref 36.0–46.0)
Hemoglobin: 7.8 g/dL — ABNORMAL LOW (ref 12.0–15.0)
Immature Granulocytes: 3 %
Lymphocytes Relative: 18 %
Lymphs Abs: 1.2 10*3/uL (ref 0.7–4.0)
MCH: 29.3 pg (ref 26.0–34.0)
MCHC: 30.8 g/dL (ref 30.0–36.0)
MCV: 95.1 fL (ref 80.0–100.0)
Monocytes Absolute: 0.8 10*3/uL (ref 0.1–1.0)
Monocytes Relative: 12 %
Neutro Abs: 4.5 10*3/uL (ref 1.7–7.7)
Neutrophils Relative %: 66 %
Platelets: 322 10*3/uL (ref 150–400)
RBC: 2.66 MIL/uL — ABNORMAL LOW (ref 3.87–5.11)
RDW: 15.8 % — ABNORMAL HIGH (ref 11.5–15.5)
WBC: 6.7 10*3/uL (ref 4.0–10.5)
nRBC: 0.6 % — ABNORMAL HIGH (ref 0.0–0.2)

## 2019-02-18 LAB — BASIC METABOLIC PANEL
Anion gap: 8 (ref 5–15)
BUN: 33 mg/dL — ABNORMAL HIGH (ref 6–20)
CO2: 19 mmol/L — ABNORMAL LOW (ref 22–32)
Calcium: 8.3 mg/dL — ABNORMAL LOW (ref 8.9–10.3)
Chloride: 109 mmol/L (ref 98–111)
Creatinine, Ser: 1.14 mg/dL — ABNORMAL HIGH (ref 0.44–1.00)
GFR calc Af Amer: >60 mL/min (ref >60)
GFR calc non Af Amer: 53 mL/min — ABNORMAL LOW (ref >60)
Glucose, Bld: 118 mg/dL — ABNORMAL HIGH (ref 70–99)
Potassium: 4.8 mmol/L (ref 3.5–5.1)
Sodium: 136 mmol/L (ref 135–145)

## 2019-02-18 LAB — GLUCOSE, CAPILLARY
Glucose-Capillary: 133 mg/dL — ABNORMAL HIGH (ref 70–99)
Glucose-Capillary: 161 mg/dL — ABNORMAL HIGH (ref 70–99)
Glucose-Capillary: 108 mg/dL — ABNORMAL HIGH (ref 70–99)

## 2019-02-18 LAB — PHOSPHORUS: Phosphorus: 3.2 mg/dL (ref 2.5–4.6)

## 2019-02-18 LAB — MAGNESIUM: Magnesium: 1.9 mg/dL (ref 1.7–2.4)

## 2019-02-18 MED ORDER — ACETAMINOPHEN 10 MG/ML IV SOLN
1000.0000 mg | Freq: Four times a day (QID) | INTRAVENOUS | Status: AC | PRN
  Administered 2019-02-18: 1000 mg via INTRAVENOUS
  Filled 2019-02-18: qty 100

## 2019-02-18 MED ORDER — TRAVASOL 10 % IV SOLN
INTRAVENOUS | Status: AC
  Administered 2019-02-18: 18:00:00 via INTRAVENOUS
  Filled 2019-02-18: qty 1296

## 2019-02-18 MED ORDER — IOHEXOL 9 MG/ML PO SOLN
500.0000 mL | ORAL | Status: AC
  Administered 2019-02-18 (×2): 500 mL via ORAL

## 2019-02-18 MED ORDER — PIPERACILLIN-TAZOBACTAM 3.375 G IVPB
3.3750 g | Freq: Three times a day (TID) | INTRAVENOUS | Status: DC
  Administered 2019-02-18 – 2019-03-01 (×33): 3.375 g via INTRAVENOUS
  Filled 2019-02-18 (×32): qty 50

## 2019-02-18 MED ORDER — IPRATROPIUM-ALBUTEROL 0.5-2.5 (3) MG/3ML IN SOLN
3.0000 mL | Freq: Two times a day (BID) | RESPIRATORY_TRACT | Status: DC
  Administered 2019-02-19 – 2019-02-20 (×4): 3 mL via RESPIRATORY_TRACT
  Filled 2019-02-18 (×4): qty 3

## 2019-02-18 MED ORDER — IOHEXOL 300 MG/ML  SOLN
100.0000 mL | Freq: Once | INTRAMUSCULAR | Status: AC | PRN
  Administered 2019-02-18: 100 mL via INTRAVENOUS

## 2019-02-18 MED ORDER — IOHEXOL 9 MG/ML PO SOLN
ORAL | Status: AC
  Administered 2019-02-18: 14:00:00
  Filled 2019-02-18: qty 1000

## 2019-02-18 MED ORDER — SODIUM CHLORIDE 0.45 % IV SOLN: INTRAVENOUS | Status: DC

## 2019-02-18 NOTE — Progress Notes (Signed)
SLP Cancellation Note  Patient Details Name: Gina [name] MRN: [id number] DOB: 10-Mar-1961   Cancelled treatment:       Reason Eval/Treat Not Completed: Medical issues which prohibited therapy(pt remains npo)   Macario Golds 02/18/2019, 8:44 AM  Luanna Salk, MS [facility] SLP Acute Rehab Services Pager [phone] Office [phone]

## 2019-02-18 NOTE — Progress Notes (Signed)
PROGRESS NOTE   Isaly Fasching   FXT:[id number]  DOB: [birth year]  DOA: 02/02/2019 PCP: [name], MD   Brief Narrative: PCCM transferred to [facility] 11/30 -patient is a 58 year old female with severe protein calorie malnutrition, poorly controlled type 2 diabetes mellitus with bilateral foot ulcers, status post bilateral below-knee knee amputation, recent left phalanx amputation, schizophrenia, hypertension, severe protein calorie malnutrition was admitted to the ICU with septic shock , she was found to have a perforated gastric ulcer with pneumoperitoneum, also had evidence of DKA on admission -she was intubated, admitted to the ICU, treated with broad-spectrum antibiotics, pressors, aggressive fluid resuscitation, ultimately underwent ex lap on 11/23 which noted a perforated prepyloric ulcer which was oversewn and 3.1 L of contaminated peritoneal fluid was drained, she was subsequently extubated on 11/29. -She has had a extensive complicated hospital course notable for DKA, acute kidney injury, acute blood loss anemia, acute thrombocytopenia, intra-abdominal sepsis, bacteremia, fungemia. -General surgery continues to follow closely, NG tube was removed on 11/25, currently on TPN, during this hospital stay was also noted to have severe ischemic changes at her BKA site, vascular surgery as well as Dr. Sharol Given were consulted, they recommended consideration of above-knee amputation when patient is more stable -Palliative medicine team was consulted on 12/7  Subjective: -No events overnight, patient had transient tachypnea overnight which has resolved -Tolerating TPN  Assessment & Plan:  Severe septic shock due to ruptured gastric ulcer with acute peritonitis, fungemia and UTI - ID consulted on 02/04/19 - Blood cultures from 11/22> Candida Glabrata- repeat blood culture 11/24 NGTD - Urine culture 11/22 > K pneumoniae - Peritoneal fluid 11/23> Candida albicans - Opthalmology consulted - no  evidence of fungal endophthalmitis  - currently receiving Andulafungin and Zosyn - ID has recommended to remove cental line to give line holiday and right IJ was removed on 12/1 - placed PICC 12/3- Gen surgery resumed TPN -02/15/2019: Sepsis physiology has resolved significantly.  Infectious disease team is managing fungemia and intra-abdominal infection with anidulafungin (day 8 of 14) and Zosyn-completed 2-week course of Zosyn yesterday 12/7 -General surgery following, having very brisk output from her JP drain, plan for repeat CT abdomen pelvis with IV contrast today  Ischemic changes at bilateral BKA site -This is secondary to severe septic shock, pressor requirement etc. -Seen by vascular surgery Dr. Scot Dock and orthopedics Dr. Sharol Given, they feel she will eventually require an above-knee amputation when she is more stable, patient declines and defers it at this time -See discussion on antibiotics above  Acute kidney injury - improving - baseline Cr is < 1.0 - Cr rose to 2.66 on 11/26, likely from ATN, now improving down to 1.1 this morning  ICU delirium -Likely secondary to all above infections, mental status improving slowly -Remains on as needed Haldol however has not required this in several days    DKA (diabetic ketoacidoses), severely uncontrolled with hyperglycemia  - resolved - A1c on 11/23>  15.2 -Continue Lantus and sliding scale, CBGs are stable now  Hypokalemia - repleted  Severe protein calorie malnutrition with hypoalbuminemia 02/15/2019: Patient still on TPN.  NG tube has been discontinued.  Currently on ice chips. -SLP is following and will assist in diet advancement.   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  Dr. Wynetta Emery spoke to Dr. Caralyn Guile 12/7 who said that RN can remove sutures and place picture in chart and he will review.    B/ l BKA  Ethics: This is a seriously ill patient with the extremely sick and complicated hospital course with fungemia, intra-abdominal infection following perforation, severe protein calorie malnutrition, uncontrolled diabetes, ischemic changes in bilateral BKA requiring bilateral AKA, remains at risk of additional complications and decline -Palliative medicine team was consulted on 12/7 for goals of care   Time spent in minutes: 35 min  DVT prophylaxis: heparin Code Status: Full code Family Communication: None at bedside Disposition Plan: Stay in stepdown  Consultants:   General surgery  ID Procedures:   Intubation/ Extubation  Ex lap Antimicrobials:  Anti-infectives (From admission, onward)   Start     Dose/Rate Route Frequency Ordered Stop   02/05/19 1000  anidulafungin (ERAXIS) 100 mg in sodium chloride 0.9 % 100 mL IVPB     100 mg 78 mL/hr over 100 Minutes Intravenous Every 24 hours 02/04/19 0840     02/04/19 1000  anidulafungin (ERAXIS) 200 mg in sodium chloride 0.9 % 200 mL IVPB     200 mg 78 mL/hr over 200 Minutes Intravenous  Once 02/04/19 0840 02/04/19 1301   02/04/19 0200  fluconazole (DIFLUCAN) IVPB 200 mg  Status:  Discontinued     200 mg 100 mL/hr over 60 Minutes Intravenous Every 24 hours 02/02/19 2302 02/04/19 0840   02/03/19 1800  vancomycin (VANCOCIN) IVPB 750 mg/150 ml premix  Status:  Discontinued     750 mg 150 mL/hr over 60 Minutes Intravenous Every 24 hours 02/03/19 0241 02/03/19 0242   02/03/19 1800  vancomycin (VANCOCIN) IVPB 750 mg/150 ml premix  Status:  Discontinued     750 mg 150 mL/hr over 60 Minutes Intravenous Every 24 hours 02/03/19 0242 02/03/19 0858   02/03/19 0600  piperacillin-tazobactam (ZOSYN) IVPB  3.375 g     3.375 g 12.5 mL/hr over 240 Minutes Intravenous Every 8 hours 02/03/19 0243 02/17/19 0205   02/02/19 2315  fluconazole (DIFLUCAN) IVPB 400 mg     400 mg 100 mL/hr over 120 Minutes Intravenous STAT 02/02/19 2301 02/03/19 0218   02/02/19 1800  ceFEPIme (MAXIPIME) 2 g in sodium chloride 0.9 % 100 mL IVPB     2 g 200 mL/hr over 30 Minutes Intravenous  Once 02/02/19 1745 02/02/19 2030   02/02/19 1800  metroNIDAZOLE (FLAGYL) IVPB 500 mg     500 mg 100 mL/hr over 60 Minutes Intravenous  Once 02/02/19 1745 02/02/19 2237   02/02/19 1800  vancomycin (VANCOCIN) IVPB 1000 mg/200 mL premix  Status:  Discontinued     1,000 mg 200 mL/hr over 60 Minutes Intravenous  Once 02/02/19 1745 02/02/19 1747   02/02/19 1800  vancomycin (VANCOCIN) 1,250 mg in sodium chloride 0.9 % 250 mL IVPB     1,250 mg 166.7 mL/hr over 90 Minutes Intravenous  Once 02/02/19 1747 02/02/19 2237      Objective: Vitals:   02/18/19 0700 02/18/19 0740 02/18/19 0800 02/18/19 0900  BP: (!) 144/62  (!) 176/72 (!) 138/54  Pulse: 91  100 (!) 101  Resp: 12  15 13   Temp:   98.9 F (37.2 C)   TempSrc:   Axillary   SpO2:  95%    Weight:  Height:        Intake/Output Summary (Last 24 hours) at 02/18/2019 1059 Last data filed at 02/18/2019 8527 Gross per 24 hour  Intake 2455.21 ml  Output 2225 ml  Net 230.21 ml   Filed Weights   02/16/19 0530 02/17/19 0500 02/18/19 0500  Weight: 59.4 kg 61.9 kg 60.4 kg    Examination: Gen: Extremely cachectic chronically ill-appearing female laying in bed awake alert, answers a few questions, no distress HEENT: PERRLA, Neck supple, no JVD Lungs: Diminished breath sounds at the bases otherwise clear CVS: S1-S2, regular rhythm, tachycardic Abd: Soft, obese, mild diffuse tenderness, mildly distended, JP drain noted with greenish output, bowel sounds diminished but present Extremities: Bilateral BKA with ischemic changes skin: Ischemic changes, discolorations noted in both  lower legs near BKA site Flexi-Seal noted  Data Reviewed: I have personally reviewed following labs and imaging studies  CBC: Recent Labs  Lab 02/13/19 0211 02/14/19 0209 02/16/19 0254 02/16/19 1809 02/17/19 0522 02/18/19 0155  WBC 5.2 5.5 5.2  --  7.2 6.7  NEUTROABS  --  3.9 3.5  --  4.5 4.5  HGB 7.7* 7.4* 6.1* 7.9* 7.6* 7.8*  HCT 25.3* 24.6* 19.9* 25.0* 24.8* 25.3*  MCV 94.4 94.3 94.3  --  94.3 95.1  PLT 282 296 338  --  341 782   Basic Metabolic Panel: Recent Labs  Lab 02/14/19 0209 02/15/19 0158 02/16/19 0254 02/17/19 0522 02/18/19 0155  NA 142 139 138   138 136 136  K 4.0 4.1 4.2   4.2 4.6 4.8  CL 111 111 109   109 106 109  CO2 21* 21* 21*   21* 19* 19*  GLUCOSE 123* 200* 253*   253* 196* 118*  BUN 23* 24* 30*   30* 32* 33*  CREATININE 1.08* 1.22* 1.23*   1.34* 1.24* 1.14*  CALCIUM 7.7* 7.4* 7.9*   7.9* 8.3* 8.3*  MG 1.8 1.8 1.9 1.8 1.9  PHOS 3.0 2.8 2.6   2.6 2.6 3.2   GFR: Estimated Creatinine Clearance: 46.6 mL/min (A) (by C-G formula based on SCr of 1.14 mg/dL (H)). Liver Function Tests: Recent Labs  Lab 02/14/19 0209 02/16/19 0254 02/17/19 0522  AST 9*  --  9*  ALT 12  --  9  ALKPHOS 56  --  54  BILITOT 0.6  --  0.4  PROT 4.9*  --  5.8*  ALBUMIN 1.2* 1.2* 1.2*   No results for input(s): LIPASE, AMYLASE in the last 168 hours. Recent Labs  Lab 02/12/19 1419  AMMONIA <9*   Coagulation Profile: No results for input(s): INR, PROTIME in the last 168 hours. Cardiac Enzymes: No results for input(s): CKTOTAL, CKMB, CKMBINDEX, TROPONINI in the last 168 hours. BNP (last 3 results) No results for input(s): PROBNP in the last 8760 hours. HbA1C: No results for input(s): HGBA1C in the last 72 hours. CBG: Recent Labs  Lab 02/16/19 2331 02/17/19 0525 02/17/19 1215 02/17/19 1906 02/17/19 2324  GLUCAP 231* 166* 147* 81 117*   Lipid Profile: Recent Labs    02/17/19 0522  TRIG 116   Thyroid Function Tests: No results for input(s): TSH,  T4TOTAL, FREET4, T3FREE, THYROIDAB in the last 72 hours. Anemia Panel: No results for input(s): VITAMINB12, FOLATE, FERRITIN, TIBC, IRON, RETICCTPCT in the last 72 hours. Urine analysis:    Component Value Date/Time   COLORURINE YELLOW 02/12/2019 1401   APPEARANCEUR CLOUDY (A) 02/12/2019 1401   LABSPEC 1.010 02/12/2019 1401   PHURINE 5.0 02/12/2019 1401  GLUCOSEU >=500 (A) 02/12/2019 1401   HGBUR MODERATE (A) 02/12/2019 1401   BILIRUBINUR NEGATIVE 02/12/2019 1401   BILIRUBINUR N 04/28/2016 1603   KETONESUR NEGATIVE 02/12/2019 1401   PROTEINUR 30 (A) 02/12/2019 1401   UROBILINOGEN 0.2 04/28/2016 1603   UROBILINOGEN 0.2 06/18/2013 0650   NITRITE NEGATIVE 02/12/2019 1401   LEUKOCYTESUR NEGATIVE 02/12/2019 1401   Recent Results (from the past 240 hour(s))  Culture, blood (routine x 2)     Status: None   Collection Time: 02/08/19 12:13 PM   Specimen: BLOOD  Result Value Ref Range Status   Specimen Description   Final    BLOOD RIGHT WRIST Performed at [facility] [address] [place], [place] [address]    Special Requests   Final    BOTTLES DRAWN AEROBIC AND ANAEROBIC Blood Culture adequate volume Performed at [facility] [address], [place], [place] [address]    Culture   Final    NO GROWTH 5 DAYS Performed at [facility], [place] [address], [place], [place] [address]    Report Status 02/13/2019 FINAL  Final  Culture, blood (routine x 2)     Status: None   Collection Time: 02/08/19 12:28 PM   Specimen: BLOOD  Result Value Ref Range Status   Specimen Description   Final    BLOOD RIGHT ARM Performed at [facility] [address], [place], [place] [address]    Special Requests   Final    BOTTLES DRAWN AEROBIC ONLY Blood Culture results may not be optimal due to an inadequate volume of blood received in culture bottles Performed at [facility] [address], [place], [place]  [address]    Culture   Final    NO GROWTH 5 DAYS Performed at [facility], [place] [address] [place], [place] [address]    Report Status 02/13/2019 FINAL  Final    Radiology Studies: Dg Chest Port 1 View  Result Date: 02/17/2019 CLINICAL DATA:  Acute respiratory distress, hypertension, diabetes mellitus, post breathing treatment EXAM: PORTABLE CHEST 1 VIEW COMPARISON:  Portable exam 1648 hours compared to 02/06/2019 FINDINGS: RIGHT arm PICC line tip projects over confluence of SVC. Nasogastric tube extends into stomach. Retained contrast in stomach and proximal jejunum. Normal heart size, mediastinal contours, and pulmonary vascularity. Bibasilar atelectasis versus infiltrate, greater on LEFT. Upper lungs clear. No pleural effusion or pneumothorax. Osseous structures unremarkable. IMPRESSION: Bibasilar atelectasis versus infiltrate greater on LEFT. Electronically Signed   By: Lavonia Dana M.D.   On: 02/17/2019 16:57   Dg Addison Bailey G Tube Plc W/fl W/rad  Result Date: 02/17/2019 CLINICAL DATA:  Nasogastric tube placement. Recent gastric ulcer repair. Evaluate for leak. EXAM: NASO G TUBE PLACEMENT WITH FL AND WITH RAD; DG UGI W SINGLE CM CONTRAST:  19m OMNIPAQUE IOHEXOL 300 MG/ML  SOLN FLUOROSCOPY TIME:  Fluoroscopy Time:  1 minutes 6 seconds Radiation Exposure Index (if provided by the fluoroscopic device): 11 mGy Number of Acquired Spot Images: 2 COMPARISON:  Upper GI 02/10/2019 and CT abdomen pelvis 02/10/2019. FINDINGS: Nasogastric tube was placed under fluoroscopic guidance with the tip positioned in the gastric cardia. Subsequently, 180 cc Omnipaque 300 was hand injected. No leak. Attempts were made to rotate the patient to help move contrast from the stomach into the proximal small bowel but, due to pain, she was not able to do so. No leak within the opacified portions of the stomach. IMPRESSION: 1. Successful nasogastric tube  placement under fluoroscopy. 2. Limited exam of the upper gastrointestinal  tract without a leak in the opacified portions of the stomach. Please see discussion above. Electronically Signed   By: Lorin Picket M.D.   On: 02/17/2019 14:49   Dg Duanne Limerick W Single Cm (sol Or Thin Ba)  Result Date: 02/17/2019 CLINICAL DATA:  Nasogastric tube placement. Recent gastric ulcer repair. Evaluate for leak. EXAM: NASO G TUBE PLACEMENT WITH FL AND WITH RAD; DG UGI W SINGLE CM CONTRAST:  141m OMNIPAQUE IOHEXOL 300 MG/ML  SOLN FLUOROSCOPY TIME:  Fluoroscopy Time:  1 minutes 6 seconds Radiation Exposure Index (if provided by the fluoroscopic device): 11 mGy Number of Acquired Spot Images: 2 COMPARISON:  Upper GI 02/10/2019 and CT abdomen pelvis 02/10/2019. FINDINGS: Nasogastric tube was placed under fluoroscopic guidance with the tip positioned in the gastric cardia. Subsequently, 180 cc Omnipaque 300 was hand injected. No leak. Attempts were made to rotate the patient to help move contrast from the stomach into the proximal small bowel but, due to pain, she was not able to do so. No leak within the opacified portions of the stomach. IMPRESSION: 1. Successful nasogastric tube placement under fluoroscopy. 2. Limited exam of the upper gastrointestinal tract without a leak in the opacified portions of the stomach. Please see discussion above. Electronically Signed   By: MLorin PicketM.D.   On: 02/17/2019 14:49    Scheduled Meds:  sodium chloride   Intravenous Once   chlorhexidine gluconate (MEDLINE KIT)  15 mL Mouth Rinse BID   Chlorhexidine Gluconate Cloth  6 each Topical Daily   collagenase   Topical Daily   Gerhardt's butt cream   Topical TID   heparin injection (subcutaneous)  5,000 Units Subcutaneous Q8H   insulin aspart  0-20 Units Subcutaneous Q6H   ipratropium-albuterol  3 mL Nebulization TID   metoprolol tartrate  2.5 mg Intravenous Q6H   pantoprazole (PROTONIX) IV  40 mg Intravenous Q12H   sodium chloride flush  10-40 mL Intracatheter Q12H   sucralfate  1 g Oral TID WC  & HS   Continuous Infusions:  sodium chloride 30 mL/hr at 02/18/19 0953   sodium chloride     sodium chloride Stopped (02/13/19 2137)   anidulafungin 100 mg (02/18/19 1035)   TPN ADULT (ION) 70 mL/hr at 02/18/19 0953   TPN ADULT (ION)       LOS: 16 days   Time spent: 387m PrDomenic PoliteMD  02/18/2019, 10:59 AM

## 2019-02-18 NOTE — Progress Notes (Addendum)
Patient ID: Gina [name], female   DOB: 17-Aug-1960, 58 y.o.   MRN: [id number]    16 Days Post-Op  Subjective: Patient is oriented when asking orientation questions, but is difficult to carry a conversation with and get information from.  Was going to have CT yesterday but delayed due to some breathing issues which seem improved this morning.    ROS: See above, otherwise other systems negative  Objective: Vital signs in last 24 hours: Temp:  [98 F (36.7 C)-99 F (37.2 C)] 98.9 F (37.2 C) (12/08 0800) Pulse Rate:  [85-105] 91 (12/08 0700) Resp:  [10-25] 12 (12/08 0700) BP: (124-173)/(56-82) 144/62 (12/08 0700) SpO2:  [95 %-99 %] 95 % (12/08 0740) Weight:  [60.4 kg] 60.4 kg (12/08 0500) Last BM Date: 02/17/19  Intake/Output from previous day: 12/07 0701 - 12/08 0700 In: 2576.6 [I.V.:2453; IV Piggyback:123.6] Out: 2125 [Urine:575; Emesis/NG output:200; Drains:1250; Stool:100] Intake/Output this shift: Total I/O In: -  Out: 300 [Drains:300]  PE: Abd: soft, copious bilious output from JP drain 1250cc yesterday, midline wound is clean and packed.  Otherwise appropriately tender, NGT in place with only 200cc documented of bilious output. Skin: see WOC, RN note for full description of sizing, but unstageable sacral wound with tight eschar present.  No overt signs of infection noted  Lab Results:  Recent Labs    02/17/19 0522 02/18/19 0155  WBC 7.2 6.7  HGB 7.6* 7.8*  HCT 24.8* 25.3*  PLT 341 322   BMET Recent Labs    02/17/19 0522 02/18/19 0155  NA 136 136  K 4.6 4.8  CL 106 109  CO2 19* 19*  GLUCOSE 196* 118*  BUN 32* 33*  CREATININE 1.24* 1.14*  CALCIUM 8.3* 8.3*   PT/INR No results for input(s): LABPROT, INR in the last 72 hours. CMP     Component Value Date/Time   NA 136 02/18/2019 0155   NA 139 10/04/2017 1705   K 4.8 02/18/2019 0155   CL 109 02/18/2019 0155   CO2 19 (L) 02/18/2019 0155   GLUCOSE 118 (H) 02/18/2019 0155   BUN 33 (H) 02/18/2019 0155    BUN 11 10/04/2017 1705   CREATININE 1.14 (H) 02/18/2019 0155   CREATININE 0.68 04/28/2016 1640   CALCIUM 8.3 (L) 02/18/2019 0155   PROT 5.8 (L) 02/17/2019 0522   PROT 7.1 10/04/2017 1705   ALBUMIN 1.2 (L) 02/17/2019 0522   ALBUMIN 3.7 10/04/2017 1705   AST 9 (L) 02/17/2019 0522   ALT 9 02/17/2019 0522   ALKPHOS 54 02/17/2019 0522   BILITOT 0.4 02/17/2019 0522   BILITOT <0.2 10/04/2017 1705   GFRNONAA 53 (L) 02/18/2019 0155   GFRNONAA >89 04/28/2016 1640   GFRAA >60 02/18/2019 0155   GFRAA >89 04/28/2016 1640   Lipase     Component Value Date/Time   LIPASE 27 04/01/2016 1040       Studies/Results: Dg Chest Port 1 View  Result Date: 02/17/2019 CLINICAL DATA:  Acute respiratory distress, hypertension, diabetes mellitus, post breathing treatment EXAM: PORTABLE CHEST 1 VIEW COMPARISON:  Portable exam 1648 hours compared to 02/06/2019 FINDINGS: RIGHT arm PICC line tip projects over confluence of SVC. Nasogastric tube extends into stomach. Retained contrast in stomach and proximal jejunum. Normal heart size, mediastinal contours, and pulmonary vascularity. Bibasilar atelectasis versus infiltrate, greater on LEFT. Upper lungs clear. No pleural effusion or pneumothorax. Osseous structures unremarkable. IMPRESSION: Bibasilar atelectasis versus infiltrate greater on LEFT. Electronically Signed   By: Lavonia Dana M.D.   On: 02/17/2019  16:57   Dg Loyce Dys Tube Plc W/fl W/rad  Result Date: 02/17/2019 CLINICAL DATA:  Nasogastric tube placement. Recent gastric ulcer repair. Evaluate for leak. EXAM: NASO G TUBE PLACEMENT WITH FL AND WITH RAD; DG UGI W SINGLE CM CONTRAST:  153mL OMNIPAQUE IOHEXOL 300 MG/ML  SOLN FLUOROSCOPY TIME:  Fluoroscopy Time:  1 minutes 6 seconds Radiation Exposure Index (if provided by the fluoroscopic device): 11 mGy Number of Acquired Spot Images: 2 COMPARISON:  Upper GI 02/10/2019 and CT abdomen pelvis 02/10/2019. FINDINGS: Nasogastric tube was placed under fluoroscopic  guidance with the tip positioned in the gastric cardia. Subsequently, 180 cc Omnipaque 300 was hand injected. No leak. Attempts were made to rotate the patient to help move contrast from the stomach into the proximal small bowel but, due to pain, she was not able to do so. No leak within the opacified portions of the stomach. IMPRESSION: 1. Successful nasogastric tube placement under fluoroscopy. 2. Limited exam of the upper gastrointestinal tract without a leak in the opacified portions of the stomach. Please see discussion above. Electronically Signed   By: Lorin Picket M.D.   On: 02/17/2019 14:49   Dg Duanne Limerick W Single Cm (sol Or Thin Ba)  Result Date: 02/17/2019 CLINICAL DATA:  Nasogastric tube placement. Recent gastric ulcer repair. Evaluate for leak. EXAM: NASO G TUBE PLACEMENT WITH FL AND WITH RAD; DG UGI W SINGLE CM CONTRAST:  128mL OMNIPAQUE IOHEXOL 300 MG/ML  SOLN FLUOROSCOPY TIME:  Fluoroscopy Time:  1 minutes 6 seconds Radiation Exposure Index (if provided by the fluoroscopic device): 11 mGy Number of Acquired Spot Images: 2 COMPARISON:  Upper GI 02/10/2019 and CT abdomen pelvis 02/10/2019. FINDINGS: Nasogastric tube was placed under fluoroscopic guidance with the tip positioned in the gastric cardia. Subsequently, 180 cc Omnipaque 300 was hand injected. No leak. Attempts were made to rotate the patient to help move contrast from the stomach into the proximal small bowel but, due to pain, she was not able to do so. No leak within the opacified portions of the stomach. IMPRESSION: 1. Successful nasogastric tube placement under fluoroscopy. 2. Limited exam of the upper gastrointestinal tract without a leak in the opacified portions of the stomach. Please see discussion above. Electronically Signed   By: Lorin Picket M.D.   On: 02/17/2019 14:49    Anti-infectives: Anti-infectives (From admission, onward)   Start     Dose/Rate Route Frequency Ordered Stop   02/05/19 1000  anidulafungin (ERAXIS)  100 mg in sodium chloride 0.9 % 100 mL IVPB     100 mg 78 mL/hr over 100 Minutes Intravenous Every 24 hours 02/04/19 0840     02/04/19 1000  anidulafungin (ERAXIS) 200 mg in sodium chloride 0.9 % 200 mL IVPB     200 mg 78 mL/hr over 200 Minutes Intravenous  Once 02/04/19 0840 02/04/19 1301   02/04/19 0200  fluconazole (DIFLUCAN) IVPB 200 mg  Status:  Discontinued     200 mg 100 mL/hr over 60 Minutes Intravenous Every 24 hours 02/02/19 2302 02/04/19 0840   02/03/19 1800  vancomycin (VANCOCIN) IVPB 750 mg/150 ml premix  Status:  Discontinued     750 mg 150 mL/hr over 60 Minutes Intravenous Every 24 hours 02/03/19 0241 02/03/19 0242   02/03/19 1800  vancomycin (VANCOCIN) IVPB 750 mg/150 ml premix  Status:  Discontinued     750 mg 150 mL/hr over 60 Minutes Intravenous Every 24 hours 02/03/19 0242 02/03/19 0858   02/03/19 0600  piperacillin-tazobactam (ZOSYN) IVPB 3.375  g     3.375 g 12.5 mL/hr over 240 Minutes Intravenous Every 8 hours 02/03/19 0243 02/17/19 0205   02/02/19 2315  fluconazole (DIFLUCAN) IVPB 400 mg     400 mg 100 mL/hr over 120 Minutes Intravenous STAT 02/02/19 2301 02/03/19 0218   02/02/19 1800  ceFEPIme (MAXIPIME) 2 g in sodium chloride 0.9 % 100 mL IVPB     2 g 200 mL/hr over 30 Minutes Intravenous  Once 02/02/19 1745 02/02/19 2030   02/02/19 1800  metroNIDAZOLE (FLAGYL) IVPB 500 mg     500 mg 100 mL/hr over 60 Minutes Intravenous  Once 02/02/19 1745 02/02/19 2237   02/02/19 1800  vancomycin (VANCOCIN) IVPB 1000 mg/200 mL premix  Status:  Discontinued     1,000 mg 200 mL/hr over 60 Minutes Intravenous  Once 02/02/19 1745 02/02/19 1747   02/02/19 1800  vancomycin (VANCOCIN) 1,250 mg in sodium chloride 0.9 % 250 mL IVPB     1,250 mg 166.7 mL/hr over 90 Minutes Intravenous  Once 02/02/19 1747 02/02/19 2237       Assessment/Plan VDRF- extubated 11/29 Poorly controlled DM2- A1c15.2 S/p bilateral BKA Ischemic changes to both LE- defer to medicine, but would  benefit from eval from Dr. 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  No plans for sharp debridement at this time.  POD16,S/pex lap with oversew of duodenal ulcer,with Graham patch 11/23,Dr. Lucia Gaskins, for perforated prepyloric ulcer with gross contamination -now with apparent bile leak, although UGI negative but with limitations.  Will plan for CT scan today with oral and IV contrast to better evaluate.  The patient is clearly leaking copious bile from her JP drain which is new in the last 48 hours. - BID wet to dry dressing changes to midline abdominal wound - BID PPI; added carafate -pulm toilet and IS -OOB to chair as much as possible -mobilize, PT/OT -cont TNA for now for nutritional supportuntil oral intake adequate. -as of right now AF and normal WBC.  No need to restart abx currently as leak appears well controlled to the drain and source is controlled.  If she starts showing signs otherwise, may need zosyn restarted, but not necessary right now.  ID - Eraxis 11/24>>Zosyn 11/23 >>12/6 FEN -NPO/NGT VTE - NoSCDs,Bilateral BKa's - sq heparin  Foley -out Follow up -Dr. Lucia Gaskins   LOS: 16 days    Henreitta Cea , [facility] 02/18/2019, 8:53 AM Please see Amion for pager number during day hours 7:00am-4:30pm

## 2019-02-18 NOTE — Progress Notes (Signed)
PHARMACY - TOTAL PARENTERAL NUTRITION CONSULT NOTE   Indication: Small bowel obstruction  Patient Measurements: Height: 5\' 2"  (157.5 cm) Weight: 133 lb 2.5 oz (60.4 kg) IBW/kg (Calculated) : 50.1 TPN AdjBW (KG): 57.9 Body mass index is 24.35 kg/m. Usual Weight: 55-60 kg Ideal Body Weight:  44.2 kg(Adjusted IBW for bilateral BKA)  Assessment:  71 yoF with poorly controlled DM s/p BL BKA, admitted 11/23 with AMS, n/v, abdominal pain. CT abdomen showed pneumoperitoneum and mesenteric ischemia. Underwent ex lap on 11/23 with finding of perforated prepyloric ulcer which was oversewn, along with gross contamination of the peritoneum. Hospital course complicated by DKA, AKI, acute thrombocytopenia, and C. glabrata candidemia. Patient remains with high NGT d/t bowel edema as demonstrated on UGI. TPN started on 11/30 but was stopped shortly thereafter to facilitate central line holiday for candidemia. Repeat blood cultures remain negative to date, and TPN now restarting with replacement of central line. Patient at risk for refeeding given prolonged lack of nutrition.  Glucose / Insulin: Hx DM; prev required D10 W infusion to maintain euglycemia  Current resistant Novolog scale q6h (12/5)   CBGs elevated, 3 units of Novolog  past 24 hr after adjustment to resistant sliding scale, and 20 units of regular insulin added to TPN  Electrolytes: WNL exc Bicarb is 19; corrected Ca 10.5, WNL  Renal: AKI on admit, SCr 1.14 trending down  LFTs / TGs: WNL (11/30), 126 (12/4), 116 ( 12/7)  Prealbumin / albumin: both low (11/30), 5.2 (12/4). 5.3 ( 12/7)  I/O: Variable NG output,  192ml out since from JP drain, 600 ml UOP MIVF: 1/2 NS at 30 ml/hr GI Imaging: 11/30 UGI: high NG output & bowel edema Surgeries / Procedures: 11/22 ex lap: oversew of duodenal ulcer with [name] patch  Central access: PICC replaced 12/3  TPN start date: resumed 12/3 (~ 2200)  Nutritional Goals (per RD recommendation on  12/7): Kcal:  2025-2330 kcal Protein:  115-130 grams Fluid:  >/= 2 L/day Goal TPN rate is 90 mL/hr (provides 129 g of protein and 2047 kcals per day) - GIR 3.3 mg/kg/min  Current Nutrition:  Begin Bariatric CL diet - avoid increasing blood sugars  Plan: at 1800  Increase custom TPN to 90 mL/hr, the target rate   Electrolytes in TPN:  14mEq/L Na, 35 mEq/L K,(decr) 42mEq/L Ca, Magnesium 10 mEq/L ( inc) , and 101mmol/L of Phos; continue Cl:Ac ratio 1:2  Standard MVI and trace elements to TPN  Continue SSI to resistant with q6h CBG checks and adjust as needed   Continue 20 units of Novolog to TPN  Decrease 1/2 NS to KVO at 1800  Monitor TPN labs on Mon/Thurs  BMET, Mag, Phos in am  Royetta Asal, PharmD, BCPS 02/18/2019 8:19 AM

## 2019-02-18 NOTE — Progress Notes (Signed)
[facility] for Infectious Disease    Date of Admission:  02/02/2019   Total days of antibiotics 17          ID: Gina [name] is a 58 y.o. female with poorly controlled IDDM, s/p bilateral aka, left 3rd digit amputation, schizophrenia, found to have severe sepsis due to perforated prepyloric ulcer with pneumoperitoneum and mesenteric ischemia s/p exlap and I x D Principal Problem:   Septic shock ([place]) Active Problems:   DKA (diabetic ketoacidoses) ([place])   Severe protein-calorie malnutrition ([place])   Acute kidney injury ([place])   Pressure injury of skin   Perforated gastric ulcer ([place])   Acute respiratory failure ([place])   Duodenal perforation ([place])   Below-knee amputee ([place])   Atherosclerosis of native arteries of extremities with gangrene, 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 No complaints, appears somewhat solomnent this afternoon. Getting prepared to get CT  RN reports increased output yesterday -> JP 1284m yesterday  Medications:  . sodium chloride   Intravenous Once  . chlorhexidine gluconate (MEDLINE KIT)  15 mL Mouth Rinse BID  . Chlorhexidine Gluconate Cloth  6 each Topical Daily  . collagenase   Topical Daily  . Gerhardt's butt cream   Topical TID  . heparin injection (subcutaneous)  5,000 Units Subcutaneous Q8H  . insulin aspart  0-20 Units Subcutaneous Q6H  . ipratropium-albuterol  3 mL Nebulization TID  . metoprolol tartrate  2.5 mg Intravenous Q6H  . pantoprazole (PROTONIX) IV  40 mg Intravenous Q12H  . sodium chloride flush  10-40 mL Intracatheter Q12H  . sucralfate  1 g Oral TID WC & HS    Objective: Vital signs in last 24 hours: Temp:  [96.2 F (35.7 C)-102 F (38.9 C)] 102 F (38.9 C) (12/08 1719) Pulse Rate:  [85-107] 101 (12/08 1719) Resp:  [10-25] 19 (12/08 1719) BP: (127-177)/(54-73) 149/66 (12/08 1700) SpO2:  [95 %-99 %] 95 % (12/08 0740) Weight:   [60.4 kg] 60.4 kg (12/08 0500) Physical Exam  Constitutional:  oriented to person, appears chronically ill and under-nourished. No distress.  HENT: [place]/AT, PERRLA, no scleral icterus Mouth/Throat: Oropharynx is clear and moist. No oropharyngeal exudate.  Cardiovascular: Normal rate, regular rhythm and normal heart sounds. Exam reveals no gallop and no friction rub.  No murmur heard.  Pulmonary/Chest: Effort normal and breath sounds normal. No respiratory distress.  has no wheezes.  Neck = supple, no nuchal rigidity Abdominal: Soft. Bowel sounds are decreased, jp drain in place. Midline incision appears packed/clean. mild tenderness.  Skin: Skin is warm and dry. Hyperpigment/eshcar bilaterally to bka Psychiatric: solomnent  Lab Results Recent Labs    02/17/19 0522 02/18/19 0155  WBC 7.2 6.7  HGB 7.6* 7.8*  HCT 24.8* 25.3*  NA 136 136  K 4.6 4.8  CL 106 109  CO2 19* 19*  BUN 32* 33*  CREATININE 1.24* 1.14*   Liver Panel Recent Labs    02/16/19 0254 02/17/19 0522  PROT  --  5.8*  ALBUMIN 1.2* 1.2*  AST  --  9*  ALT  --  9  ALKPHOS  --  54  BILITOT  --  0.4    Microbiology: 11/22 c.glabrata blood cx  Studies/Results: Addendum of abd CT   Hepatobiliary: Subcapsular fluid collection along the RIGHT hepatic lobe measures 5.5 by 1.3 cm and is smaller than comparison exam.  Percutaneous drainage catheter enters the RIGHT abdominal wall  adjacent to the RIGHT hepatic lobe. Catheter terminates adjacent the porta hepatis. No fluid collections associated with catheter.  The gallbladder is collapsed around gallstones. No biliary duct dilatation.  Pancreas: Pancreas is normal. No ductal dilatation. No pancreatic inflammation.  Spleen: Normal spleen  Adrenals/urinary tract: Adrenal glands are normal. Kidneys enhance symmetrically. There is contrast within mildly distended bladder. Small a gas bladder.  Stomach/Bowel: NG tube extends the stomach.  There is a  large volume of high-density fluid within the ventral peritoneal space which appears associated with abnormal loops of small bowel. There is mild pneumatosis with the loops of small bowel (a image 47/3). The walls of the bowel are not well identified. This high-density collection which is favored to be within the loops small bowel suggest flaccid bowel with limited peristalsis resulting in a matted collection of contrast.  Contrast does flow into the distal small bowel bowel and colon.  Vascular/Lymphatic: Abdominal aorta is normal caliber. No periportal or retroperitoneal adenopathy. No pelvic adenopathy.  Reproductive: Endometrium is mildly expanded to 8 mm  Other: There several pockets of intraperitoneal free fluid with enhancing rim. 1 pocket is posterior to the uterus measuring 5.4 x 3.5 cm. Similar smaller pocket in the peritoneal space ventrally on the LEFT measures 1.5 x 2.5 cm sagittal image 90/126.  Musculoskeletal: No aggressive osseous lesion.  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 Plan to treat at minimum through 12/14. Depending on goals of care, this maybe extended if no source control given CT findings  Intra-abdominal infection from gastric perforation/ischemic bowel = recommend to restart piptazo for broad coverage. It is unclear how to design end date on abtx until source control is achieved. Defer to surgery and also agree with palliative care/goals of care discussion.   [facility] for Infectious Diseases Cell: [phone] Pager: [phone]

## 2019-02-18 NOTE — Progress Notes (Signed)
Pt having difficulty breathing and extremely anxious. CT planned for 2200; RN asked CT tech to post pone CT scan until tomorrow because RN is concerned about pt mental and oxygen stablility. Tech rescheduled plan for CT for 02/19/19. RN placed pt on [place] at 2L and will continue to monitor. O2 Sats at 94%.

## 2019-02-18 NOTE — Progress Notes (Signed)
OT Cancellation Note  Patient Details Name: Gina [name] MRN: [id number] DOB: 20-Jun-1960   Cancelled Treatment:    Reason Eval/Treat Not Completed: Fatigue/lethargy limiting ability to participate   Pt agreed for OT to check on pt next day  Kari Baars, [place] [phone] Office- [phone], Edwena Felty D 02/18/2019, 2:35 PM

## 2019-02-19 DIAGNOSIS — Z515 Encounter for palliative care: Secondary | ICD-10-CM

## 2019-02-19 DIAGNOSIS — K9189 Other postprocedural complications and disorders of digestive system: Secondary | ICD-10-CM

## 2019-02-19 LAB — BASIC METABOLIC PANEL
Anion gap: 6 (ref 5–15)
BUN: 36 mg/dL — ABNORMAL HIGH (ref 6–20)
CO2: 21 mmol/L — ABNORMAL LOW (ref 22–32)
Calcium: 8.2 mg/dL — ABNORMAL LOW (ref 8.9–10.3)
Chloride: 107 mmol/L (ref 98–111)
Creatinine, Ser: 0.99 mg/dL (ref 0.44–1.00)
GFR calc Af Amer: >60 mL/min (ref >60)
GFR calc non Af Amer: >60 mL/min (ref >60)
Glucose, Bld: 142 mg/dL — ABNORMAL HIGH (ref 70–99)
Potassium: 4.9 mmol/L (ref 3.5–5.1)
Sodium: 134 mmol/L — ABNORMAL LOW (ref 135–145)

## 2019-02-19 LAB — MAGNESIUM: Magnesium: 2.1 mg/dL (ref 1.7–2.4)

## 2019-02-19 LAB — GLUCOSE, CAPILLARY
Glucose-Capillary: 141 mg/dL — ABNORMAL HIGH (ref 70–99)
Glucose-Capillary: 149 mg/dL — ABNORMAL HIGH (ref 70–99)
Glucose-Capillary: 156 mg/dL — ABNORMAL HIGH (ref 70–99)
Glucose-Capillary: 165 mg/dL — ABNORMAL HIGH (ref 70–99)

## 2019-02-19 LAB — PHOSPHORUS: Phosphorus: 3.9 mg/dL (ref 2.5–4.6)

## 2019-02-19 MED ORDER — TRAVASOL 10 % IV SOLN
INTRAVENOUS | Status: AC
  Administered 2019-02-19: 18:00:00 via INTRAVENOUS
  Filled 2019-02-19: qty 1296

## 2019-02-19 NOTE — Progress Notes (Signed)
OT Cancellation Note  Patient Details Name: Gina [name] MRN: [id number] DOB: 04-16-1960   Cancelled Treatment:    Reason Eval/Treat Not Completed: Other (comment)  Spoke with RN.  Pt had palliative meeting this day.  Will await results of meeting prior to OT. Rn reports pt not well this day and in agreement with this plan Kari Baars, [place] [phone] Office- [phone], Thereasa Parkin 02/19/2019, 4:33 PM

## 2019-02-19 NOTE — Progress Notes (Signed)
PHARMACY - TOTAL PARENTERAL NUTRITION CONSULT NOTE   Indication: Small bowel obstruction  Patient Measurements: Height: 5\' 2"  (157.5 cm) Weight: 135 lb 5.8 oz (61.4 kg) IBW/kg (Calculated) : 50.1 TPN AdjBW (KG): 57.9 Body mass index is 24.76 kg/m. Usual Weight: 55-60 kg Ideal Body Weight:  44.2 kg(Adjusted IBW for bilateral BKA)  Assessment:  34 yoF with poorly controlled DM s/p BL BKA, admitted 11/23 with AMS, n/v, abdominal pain. CT abdomen showed pneumoperitoneum and mesenteric ischemia. Underwent ex lap on 11/23 with finding of perforated prepyloric ulcer which was oversewn, along with gross contamination of the peritoneum. Hospital course complicated by DKA, AKI, acute thrombocytopenia, and C. glabrata candidemia. Patient remains with high NGT d/t bowel edema as demonstrated on UGI. TPN started on 11/30 but was stopped shortly thereafter to facilitate central line holiday for candidemia. Repeat blood cultures remain negative to date, and TPN now restarting with replacement of central line. Patient at risk for refeeding given prolonged lack of nutrition.  Glucose / Insulin: Hx DM; prev required D10 W infusion to maintain euglycemia  Current resistant Novolog scale q6h (12/5)   CBGs ( goal <150) controlled range 108-161, 10 units of Novolog  past 24 hr after adjustment to resistant sliding scale, and 20 units of regular insulin added to TPN  Electrolytes: WNL exc Bicarb is 21, K and Phos trending up; corrected Ca 10.5, WNL  Renal: AKI on admit, SCr 0.99 trending down  LFTs / TGs: WNL (11/30), 126 (12/4), 116 ( 12/7)  Prealbumin / albumin: both low (11/30), 5.2 (12/4). 5.3 ( 12/7)  I/O: Variable NG output,  1754ml on 12/8 out since from JP drain, 1360ml UOP MIVF: 1/2 NS at 10 ml/hr GI Imaging: 11/30 UGI: high NG output & bowel edema Surgeries / Procedures: 11/22 ex lap: oversew of duodenal ulcer with [name] patch  Central access: PICC replaced 12/3  TPN start date: resumed 12/3 (~  2200)  Nutritional Goals (per RD recommendation on 12/7): Kcal:  2025-2330 kcal Protein:  115-130 grams Fluid:  >/= 2 L/day Goal TPN rate is 90 mL/hr (provides 129 g of protein and 2047 kcals per day) - GIR 3.3 mg/kg/min  Current Nutrition:  Begin Bariatric CL diet - avoid increasing blood sugars  Plan: at 1800  Continue custom TPN to 90 mL/hr, the target rate   Electrolytes in TPN:  50mEq/L Na, 25 mEq/L K,(decr) 8mEq/L Ca, Magnesium 10 mEq/L ( inc) , and 10 mmol/L of Phos ( dec) ; continue Cl:Ac ratio 1:2  Standard MVI and trace elements to TPN  Continue SSI to resistant with q6h CBG checks and adjust as needed   Continue 20 units of Novolog to TPN  Continue 1/2 NS at [facility]    Monitor TPN labs on Mon/Thurs   Royetta Asal, PharmD, BCPS 02/19/2019 9:39 AM

## 2019-02-19 NOTE — Progress Notes (Signed)
[facility] for Infectious Disease    Date of Admission:  02/02/2019   Total days of antibiotics           ID: Gina [name] is a 58 y.o. female with sepsis with perforated gastric ulcer with mesenteric ischemia and lower necrosis from hypoperfusion/bullae Principal Problem:   Septic shock ([place]) Active Problems:   DKA (diabetic ketoacidoses) ([place])   Severe protein-calorie malnutrition ([place])   Acute kidney injury ([place])   Pressure injury of skin   Perforated gastric ulcer ([place])   Acute respiratory failure ([place])   Duodenal perforation ([place])   Below-knee amputee ([place])   Atherosclerosis of native arteries of extremities with gangrene, right leg (HCC)   Atherosclerosis of native arteries of extremities with gangrene, left leg (HCC)    Subjective: Afebrile, still having significant output for ng and jp drain  Medications:  . sodium chloride   Intravenous Once  . chlorhexidine gluconate (MEDLINE KIT)  15 mL Mouth Rinse BID  . Chlorhexidine Gluconate Cloth  6 each Topical Daily  . collagenase   Topical Daily  . Gerhardt's butt cream   Topical TID  . heparin injection (subcutaneous)  5,000 Units Subcutaneous Q8H  . insulin aspart  0-20 Units Subcutaneous Q6H  . ipratropium-albuterol  3 mL Nebulization BID  . metoprolol tartrate  2.5 mg Intravenous Q6H  . pantoprazole (PROTONIX) IV  40 mg Intravenous Q12H  . sodium chloride flush  10-40 mL Intracatheter Q12H  . sucralfate  1 g Oral TID WC & HS    Objective: Vital signs in last 24 hours: Temp:  [98 F (36.7 C)-102 F (38.9 C)] 98.1 F (36.7 C) (12/09 0800) Pulse Rate:  [86-101] 95 (12/09 1600) Resp:  [10-23] 18 (12/09 1600) BP: (120-160)/(50-71) 160/54 (12/09 1600) SpO2:  [95 %-96 %] 96 % (12/09 0930) Weight:  [61.4 kg] 61.4 kg (12/09 0500)  Physical Exam  Constitutional:  oriented to person, place, and time. appears well-developed and well-nourished. No distress.  HENT: [place]/AT, PERRLA, no scleral icterus  Mouth/Throat: Oropharynx is clear and moist. No oropharyngeal exudate.  Cardiovascular: Normal rate, regular rhythm and normal heart sounds. Exam reveals no gallop and no friction rub.  No murmur heard.  Pulmonary/Chest: Effort normal and breath sounds normal. No respiratory distress.  has no wheezes.  Neck = supple, no nuchal rigidity Abdominal: Soft. Bowel sounds are normal.  exhibits no distension. There is no tenderness.  Ext: lower BKA bilateral - is    Lab Results Recent Labs    02/17/19 0522 02/18/19 0155 02/19/19 0427  WBC 7.2 6.7  --   HGB 7.6* 7.8*  --   HCT 24.8* 25.3*  --   NA 136 136 134*  K 4.6 4.8 4.9  CL 106 109 107  CO2 19* 19* 21*  BUN 32* 33* 36*  CREATININE 1.24* 1.14* 0.99   Liver Panel Recent Labs    02/17/19 0522  PROT 5.8*  ALBUMIN 1.2*  AST 9*  ALT 9  ALKPHOS 54  BILITOT 0.4   Sedimentation Rate No results for input(s): ESRSEDRATE in the last 72 hours. C-Reactive Protein No results for input(s): CRP in the last 72 hours.  Microbiology: reviewed Studies/Results: Ct Abdomen Pelvis W Contrast  Result Date: 02/18/2019 CLINICAL DATA:  Acute abdominal pain. Status post pre pyloric compared gastric ulcer perforation. EXAM: CT ABDOMEN AND PELVIS WITH CONTRAST TECHNIQUE: Multidetector CT imaging of the abdomen and pelvis was performed using the standard protocol following bolus administration of intravenous contrast.  CONTRAST:  121m OMNIPAQUE IOHEXOL 300 MG/ML  SOLN COMPARISON:  CT ABDOMEN 02/10/2019, UPPER GI 02/17/2019 FINDINGS: Lower chest: DENSE BIBASILAR ATELECTASIS WITH AIR BRONCHOGRAMS. SMALL BILATERAL PLEURAL EFFUSIONS. NO PULMONARY EDEMA. Hepatobiliary: Subcapsular fluid collection along the RIGHT hepatic lobe measures 5.5 by 1.3 cm and is smaller than comparison exam. Percutaneous drainage catheter enters the RIGHT abdominal wall adjacent to the RIGHT hepatic lobe. Catheter terminates adjacent the porta hepatis. No fluid collections associated  with catheter. The gallbladder is collapsed around gallstones. No biliary duct dilatation. Pancreas: Pancreas is normal. No ductal dilatation. No pancreatic inflammation. Spleen: Normal spleen Adrenals/urinary tract: Adrenal glands are normal. Kidneys enhance symmetrically. There is contrast within mildly distended bladder. Small a gas bladder. Stomach/Bowel: NG tube extends the stomach. There is a large volume of high-density fluid within the ventral peritoneal space which appears associated with abnormal loops of small bowel. There is mild pneumatosis with the loops of small bowel (a image 47/3). The walls of the bowel are not well identified. This high-density collection which is favored to be within the loops small bowel suggest flaccid bowel with limited peristalsis resulting in a matted collection of contrast. Contrast does flow into the distal small bowel bowel and colon. Vascular/Lymphatic: Abdominal aorta is normal caliber. No periportal or retroperitoneal adenopathy. No pelvic adenopathy. Reproductive: Endometrium is mildly expanded to 8 mm Other: There several pockets of intraperitoneal free fluid with enhancing rim. 1 pocket is posterior to the uterus measuring 5.4 x 3.5 cm. Similar smaller pocket in the peritoneal space ventrally on the LEFT measures 1.5 x 2.5 cm sagittal image 90/126. Musculoskeletal: No aggressive osseous lesion. IMPRESSION: 1. Stasis of oral contrast within the mid small bowel which floats in the central abdomen. The loops of bowel are difficult to discern from each and there is pneumatosis within several loops of bowel. Findings concerning for flaccid bowel/ischemic bowel. Recommend clinical correlation and surgical consultation. 2. The above contrast collection in the ventral abdomen is not favored to be related to gastric perforation as there is no high-density intraperitoneal fluid elsewhere and no significant intraperitoneal free air. 3. Several pockets of fluid within the  peritoneal space with thin enhancing rims consistent with peritonitis. 4. Reduction in subcapsular fluid collection along the RIGHT hepatic lobe which may represent a biloma. 5. Dense bibasilar atelectasis. 1.  Findings conveyed toKELLY OSBORNE on 02/18/2019  at17:24. Electronically Signed   By: SSuzy BouchardM.D.   On: 02/18/2019 17:30     Assessment/Plan: Continue on current abtx for broadspectrum coverage. Will need to do goals of care to help find endpoint on abtx  [facility] Cell: [phone]: [phone]  02/19/2019, 5:13 PM

## 2019-02-19 NOTE — Progress Notes (Addendum)
PALLIATIVE NOTE:  Referral received on 12/8 for goals of care discussion. Voicemail was left with family. New consult placed 12/9. Patient expressed she would like her daughter at the bedside for [place] discussion versus her son.   I was able to speak with daughter, Azlee Boesen this morning. I introduced myself and Palliative's role in her mother's care. Daughter verbalized understanding. 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 She is unsure if she will be able to come and be at the bedside before 5-6pm. I explained the importance of having the [place] discussion and her mother's request for her involvement. Daughter verbalized understanding. I offered to write a letter to her employer requesting her presence and approval for daughter to be excused for this important meeting. Daughter verbalized appreciation. 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 Daughter states she is not in position to leave without approval or to miss work without approval.   I also provided the option to have phone conference via video chat or voice in the case she is unable to leave work or be here during the times we are on service. Daughter states she would rather be here in person if at all possible but would also consider this option.   Detailed notes and recommendations once GOC completed with patient and daughter as requested by patient.   I will wait to hear from back from Ms. Showman for meeting time and availability.   Thank you for your referral and allowing Palliative to assist in Ms. Hyun's care.   Alda Lea, AGPCNP-BC Palliative Medicine Team  Phone: [phone] Pager: [phone] Amion: N. Cousar   NO CHARGE

## 2019-02-19 NOTE — Progress Notes (Signed)
RN removed twelve sutures from patients left hand per MD order, no issues noted.

## 2019-02-19 NOTE — Progress Notes (Signed)
PROGRESS NOTE   Gina [name]   HDQ:[id number]  DOB: [birth year]  DOA: 02/02/2019 PCP: [name], MD   Brief Narrative: PCCM transferred to [facility] 11/30 -The patient is a 58 year old female with severe protein calorie malnutrition, poorly controlled type 2 diabetes mellitus with bilateral foot ulcers, status post bilateral below-knee knee amputation, recent left phalanx amputation, schizophrenia, hypertension, severe protein calorie malnutrition was admitted to the ICU with septic shock , she was found to have a perforated gastric ulcer with pneumoperitoneum, also had evidence of DKA on admission -she was intubated, admitted to the ICU, treated with broad-spectrum antibiotics, pressors, aggressive fluid resuscitation, ultimately underwent ex lap on 11/23 which noted a perforated prepyloric ulcer which was oversewn and 3.1 L of contaminated peritoneal fluid was drained, she was subsequently extubated on 11/29. -She has had a extensive complicated hospital course notable for DKA, acute kidney injury, acute blood loss anemia, acute thrombocytopenia, intra-abdominal sepsis, bacteremia, fungemia. -General surgery continues to follow closely, NG tube was removed on 11/25, currently on TPN, during this hospital stay was also noted to have severe ischemic changes at her BKA site, vascular surgery as well as Dr. Sharol Given were consulted, they recommended consideration of above-knee amputation when patient is more stable -Palliative medicine team was consulted on 12/7  Subjective: -No events overnight. She denies pain. She is tolerating TPN.   Assessment & Plan:  Severe septic shock due to ruptured gastric ulcer with acute peritonitis, fungemia and UTI - ID consulted on 02/04/19 and following.  - Blood cultures from 11/22> Candida Glabrata- repeat blood culture 11/24 NGTD - Urine culture 11/22 > K pneumoniae - Peritoneal fluid 11/23> Candida albicans - Opthalmology consulted - no evidence of fungal  endophthalmitis  - currently receiving Andulafungin and Zosyn - ID has recommended to remove central line to give line holiday and right IJ was removed on 12/1 - placed PICC 12/3- Gen surgery resumed TPN -02/15/2019: Sepsis physiology has resolved significantly.  Infectious disease team is managing fungemia and intra-abdominal infection, antifungal and antibiotics per their recommendations.  -General surgery following  Ischemic changes at bilateral BKA site -This is secondary to severe septic shock, required pressors.  -Seen by vascular surgery Dr. Scot Dock and orthopedics Dr. Sharol Given, they feel she will eventually require an above-knee amputation when she is more stable, patient declines and defers it at this time   Acute kidney injury - improving - baseline Cr is < 1.0 - Cr rose to 2.66 on 11/26, likely from ATN, now improving down to 0.99 this morning  ICU delirium -Likely secondary to all above infections, mental status improving slowly -Remains on as needed Haldol however has not required this in several days    DKA (diabetic ketoacidoses), severely uncontrolled with hyperglycemia  - resolved - A1c on 11/23>  15.2 -Continue Lantus and sliding scale, CBGs are stable now  Hypokalemia - replete as needed, recheck level in am.   Severe protein calorie malnutrition with hypoalbuminemia 02/15/2019: Patient still on TPN.  NG tube has been discontinued.  Currently on ice chips. -SLP is following and will assist in diet advancement.   Anemia due to acute blood loss, critical illness - Has required 2 units of PRBC this admission -Stable in the 7.5-8 range -Will repeat labs in am, transfuse if Hgb <7.  Acute thrombocytopenia - related to septic shock - nadir was > 31 on 11/27 - Platelets have risen back to normal   Elevated HCV antibody - 11/25 - HCV > 11.0  Left phalanx  amputation  - on 12/28/18- Hand surgery contacted by general surgery on 11/30 to request suture removal.  Dr.  Wynetta Emery spoke to Dr. Caralyn Guile 12/7 who said that RN can remove sutures and place picture in chart and he will review.   -Sutures were removed on 12/9 by RN with no complications.   S/p Bl BKA  Ethics: This is a seriously ill patient with the extremely sick and complicated hospital course with fungemia, intra-abdominal infection following perforation, severe protein calorie malnutrition, uncontrolled diabetes, ischemic changes in bilateral BKA requiring bilateral AKA, remains at risk of additional complications and decline -Palliative medicine team was consulted on 12/7 for goals of care, setting meeting with family, appreciate recommendations.    Time spent in minutes: 35 min  DVT prophylaxis: heparin Code Status: Full code Family Communication: None at bedside Disposition Plan: Stay in stepdown  Consultants:   General surgery  ID  Palliative Care  Procedures:   Intubation/ Extubation  Ex lap Antimicrobials:  Anti-infectives (From admission, onward)   Start     Dose/Rate Route Frequency Ordered Stop   02/18/19 1800  piperacillin-tazobactam (ZOSYN) IVPB 3.375 g     3.375 g 12.5 mL/hr over 240 Minutes Intravenous Every 8 hours 02/18/19 1652     02/05/19 1000  anidulafungin (ERAXIS) 100 mg in sodium chloride 0.9 % 100 mL IVPB     100 mg 78 mL/hr over 100 Minutes Intravenous Every 24 hours 02/04/19 0840     02/04/19 1000  anidulafungin (ERAXIS) 200 mg in sodium chloride 0.9 % 200 mL IVPB     200 mg 78 mL/hr over 200 Minutes Intravenous  Once 02/04/19 0840 02/04/19 1301   02/04/19 0200  fluconazole (DIFLUCAN) IVPB 200 mg  Status:  Discontinued     200 mg 100 mL/hr over 60 Minutes Intravenous Every 24 hours 02/02/19 2302 02/04/19 0840   02/03/19 1800  vancomycin (VANCOCIN) IVPB 750 mg/150 ml premix  Status:  Discontinued     750 mg 150 mL/hr over 60 Minutes Intravenous Every 24 hours 02/03/19 0241 02/03/19 0242   02/03/19 1800  vancomycin (VANCOCIN) IVPB 750 mg/150 ml premix   Status:  Discontinued     750 mg 150 mL/hr over 60 Minutes Intravenous Every 24 hours 02/03/19 0242 02/03/19 0858   02/03/19 0600  piperacillin-tazobactam (ZOSYN) IVPB 3.375 g     3.375 g 12.5 mL/hr over 240 Minutes Intravenous Every 8 hours 02/03/19 0243 02/17/19 0205   02/02/19 2315  fluconazole (DIFLUCAN) IVPB 400 mg     400 mg 100 mL/hr over 120 Minutes Intravenous STAT 02/02/19 2301 02/03/19 0218   02/02/19 1800  ceFEPIme (MAXIPIME) 2 g in sodium chloride 0.9 % 100 mL IVPB     2 g 200 mL/hr over 30 Minutes Intravenous  Once 02/02/19 1745 02/02/19 2030   02/02/19 1800  metroNIDAZOLE (FLAGYL) IVPB 500 mg     500 mg 100 mL/hr over 60 Minutes Intravenous  Once 02/02/19 1745 02/02/19 2237   02/02/19 1800  vancomycin (VANCOCIN) IVPB 1000 mg/200 mL premix  Status:  Discontinued     1,000 mg 200 mL/hr over 60 Minutes Intravenous  Once 02/02/19 1745 02/02/19 1747   02/02/19 1800  vancomycin (VANCOCIN) 1,250 mg in sodium chloride 0.9 % 250 mL IVPB     1,250 mg 166.7 mL/hr over 90 Minutes Intravenous  Once 02/02/19 1747 02/02/19 2237      Objective: Vitals:   02/19/19 0930 02/19/19 1000 02/19/19 1100 02/19/19 1200  BP:  (!) 158/60 [name])  159/65 (!) 151/65  Pulse:  99 95 93  Resp:  13 18 16   Temp:      TempSrc:      SpO2: 96%     Weight:      Height:        Intake/Output Summary (Last 24 hours) at 02/19/2019 1312 Last data filed at 02/19/2019 1220 Gross per 24 hour  Intake 2566.37 ml  Output 3175 ml  Net -608.63 ml   Filed Weights   02/17/19 0500 02/18/19 0500 02/19/19 0500  Weight: 61.9 kg 60.4 kg 61.4 kg    Examination: Gen: Extremely cachectic chronically ill-appearing female laying in bed awake alert, answers a few questions, no distress HEENT: PERRLA, Neck supple, no JVD Lungs: Diminished breath sounds at the bases, no wheezing CVS: RRR Abd: Soft, mild diffuse tenderness, mildly distended, bowel sounds diminished but present Extremities: Bilateral BKA with ischemic  changes skin: Ischemic changes, discolorations noted in both lower legs near BKA site Flexi-Seal noted  Data Reviewed: I have personally reviewed following labs and imaging studies  CBC: Recent Labs  Lab 02/13/19 0211 02/14/19 0209 02/16/19 0254 02/16/19 1809 02/17/19 0522 02/18/19 0155  WBC 5.2 5.5 5.2  --  7.2 6.7  NEUTROABS  --  3.9 3.5  --  4.5 4.5  HGB 7.7* 7.4* 6.1* 7.9* 7.6* 7.8*  HCT 25.3* 24.6* 19.9* 25.0* 24.8* 25.3*  MCV 94.4 94.3 94.3  --  94.3 95.1  PLT 282 296 338  --  341 299   Basic Metabolic Panel: Recent Labs  Lab 02/15/19 0158 02/16/19 0254 02/17/19 0522 02/18/19 0155 02/19/19 0427  NA 139 138   138 136 136 134*  K 4.1 4.2   4.2 4.6 4.8 4.9  CL 111 109   109 106 109 107  CO2 21* 21*   21* 19* 19* 21*  GLUCOSE 200* 253*   253* 196* 118* 142*  BUN 24* 30*   30* 32* 33* 36*  CREATININE 1.22* 1.23*   1.34* 1.24* 1.14* 0.99  CALCIUM 7.4* 7.9*   7.9* 8.3* 8.3* 8.2*  MG 1.8 1.9 1.8 1.9 2.1  PHOS 2.8 2.6   2.6 2.6 3.2 3.9   GFR: Estimated Creatinine Clearance: 54 mL/min (by C-G formula based on SCr of 0.99 mg/dL). Liver Function Tests: Recent Labs  Lab 02/14/19 0209 02/16/19 0254 02/17/19 0522  AST 9*  --  9*  ALT 12  --  9  ALKPHOS 56  --  54  BILITOT 0.6  --  0.4  PROT 4.9*  --  5.8*  ALBUMIN 1.2* 1.2* 1.2*   No results for input(s): LIPASE, AMYLASE in the last 168 hours. Recent Labs  Lab 02/12/19 1419  AMMONIA <9*   Coagulation Profile: No results for input(s): INR, PROTIME in the last 168 hours. Cardiac Enzymes: No results for input(s): CKTOTAL, CKMB, CKMBINDEX, TROPONINI in the last 168 hours. BNP (last 3 results) No results for input(s): PROBNP in the last 8760 hours. HbA1C: No results for input(s): HGBA1C in the last 72 hours. CBG: Recent Labs  Lab 02/18/19 0743 02/18/19 1145 02/18/19 1746 02/18/19 2359 02/19/19 1142  GLUCAP 133* 161* 108* 149* 156*   Lipid Profile: Recent Labs    02/17/19 0522  TRIG 116   Thyroid  Function Tests: No results for input(s): TSH, T4TOTAL, FREET4, T3FREE, THYROIDAB in the last 72 hours. Anemia Panel: No results for input(s): VITAMINB12, FOLATE, FERRITIN, TIBC, IRON, RETICCTPCT in the last 72 hours. Urine analysis:    Component Value  Date/Time   COLORURINE YELLOW 02/12/2019 1401   APPEARANCEUR CLOUDY (A) 02/12/2019 1401   LABSPEC 1.010 02/12/2019 1401   PHURINE 5.0 02/12/2019 1401   GLUCOSEU >=500 (A) 02/12/2019 1401   HGBUR MODERATE (A) 02/12/2019 1401   BILIRUBINUR NEGATIVE 02/12/2019 1401   BILIRUBINUR N 04/28/2016 1603   KETONESUR NEGATIVE 02/12/2019 1401   PROTEINUR 30 (A) 02/12/2019 1401   UROBILINOGEN 0.2 04/28/2016 1603   UROBILINOGEN 0.2 06/18/2013 0650   NITRITE NEGATIVE 02/12/2019 1401   LEUKOCYTESUR NEGATIVE 02/12/2019 1401   Recent Results (from the past 240 hour(s))  Culture, blood (routine x 2)     Status: None (Preliminary result)   Collection Time: 02/18/19  5:54 PM   Specimen: BLOOD LEFT ARM  Result Value Ref Range Status   Specimen Description   Final    BLOOD LEFT ARM Performed at [facility], [place] [address] [place], [place] [address]    Special Requests   Final    BOTTLES DRAWN AEROBIC AND ANAEROBIC Blood Culture adequate volume Performed at [facility] [address] [place], [place] [address]    Culture   Final    NO GROWTH < 12 HOURS Performed at [facility] [address] [place], [place] [address]    Report Status PENDING  Incomplete  Culture, blood (routine x 2)     Status: None (Preliminary result)   Collection Time: 02/18/19  6:07 PM   Specimen: BLOOD LEFT ARM  Result Value Ref Range Status   Specimen Description   Final    BLOOD LEFT ARM Performed at [facility] [address], [place], [place] [address]    Special Requests   Final    BOTTLES DRAWN AEROBIC AND ANAEROBIC Blood Culture adequate volume Performed at [facility] [address], [place], [place] [address]    Culture   Final    NO GROWTH < 12 HOURS Performed at [facility] [address] [place],  [address]    Report Status PENDING  Incomplete    Radiology Studies: Ct Abdomen Pelvis W Contrast  Result Date: 02/18/2019 CLINICAL DATA:  Acute abdominal pain. Status post pre pyloric compared gastric ulcer perforation. EXAM: CT ABDOMEN AND PELVIS WITH CONTRAST TECHNIQUE: Multidetector CT imaging of the abdomen and pelvis was performed using the standard protocol following bolus administration of intravenous contrast. CONTRAST:  173m OMNIPAQUE IOHEXOL 300 MG/ML  SOLN COMPARISON:  CT ABDOMEN 02/10/2019, UPPER GI 02/17/2019 FINDINGS: Lower chest: DENSE BIBASILAR ATELECTASIS WITH AIR BRONCHOGRAMS. SMALL BILATERAL PLEURAL EFFUSIONS. NO PULMONARY EDEMA. Hepatobiliary: Subcapsular fluid collection along the RIGHT hepatic lobe measures 5.5 by 1.3 cm and is smaller than comparison exam. Percutaneous drainage catheter enters the RIGHT abdominal wall adjacent to the RIGHT hepatic lobe. Catheter terminates adjacent the porta hepatis. No fluid collections associated with catheter. The gallbladder is collapsed around gallstones. No biliary duct dilatation. Pancreas: Pancreas is normal. No ductal dilatation. No pancreatic inflammation. Spleen: Normal spleen Adrenals/urinary tract: Adrenal glands are normal. Kidneys enhance symmetrically. There is contrast within mildly distended bladder. Small a gas bladder. Stomach/Bowel: NG tube extends the stomach. There is a large volume of high-density fluid within the ventral peritoneal space which appears associated with abnormal loops of small bowel. There is mild pneumatosis with the loops of small bowel (a image 47/3). The walls of the bowel are not well identified. This high-density collection which is favored to be within the loops small bowel suggest flaccid  bowel with limited peristalsis resulting in a matted collection of  contrast. Contrast does flow into the distal small bowel bowel and colon. Vascular/Lymphatic: Abdominal aorta is normal caliber. No periportal or retroperitoneal adenopathy. No pelvic adenopathy. Reproductive: Endometrium is mildly expanded to 8 mm Other: There several pockets of intraperitoneal free fluid with enhancing rim. 1 pocket is posterior to the uterus measuring 5.4 x 3.5 cm. Similar smaller pocket in the peritoneal space ventrally on the LEFT measures 1.5 x 2.5 cm sagittal image 90/126. Musculoskeletal: No aggressive osseous lesion. IMPRESSION: 1. Stasis of oral contrast within the mid small bowel which floats in the central abdomen. The loops of bowel are difficult to discern from each and there is pneumatosis within several loops of bowel. Findings concerning for flaccid bowel/ischemic bowel. Recommend clinical correlation and surgical consultation. 2. The above contrast collection in the ventral abdomen is not favored to be related to gastric perforation as there is no high-density intraperitoneal fluid elsewhere and no significant intraperitoneal free air. 3. Several pockets of fluid within the peritoneal space with thin enhancing rims consistent with peritonitis. 4. Reduction in subcapsular fluid collection along the RIGHT hepatic lobe which may represent a biloma. 5. Dense bibasilar atelectasis. 1.  Findings conveyed toKELLY OSBORNE on 02/18/2019  at17:24. Electronically Signed   By: Suzy Bouchard M.D.   On: 02/18/2019 17:30   Dg Chest Port 1 View  Result Date: 02/17/2019 CLINICAL DATA:  Acute respiratory distress, hypertension, diabetes mellitus, post breathing treatment EXAM: PORTABLE CHEST 1 VIEW COMPARISON:  Portable exam 1648 hours compared to 02/06/2019 FINDINGS: RIGHT arm PICC line tip projects over confluence of SVC. Nasogastric tube extends into stomach. Retained contrast in stomach and proximal jejunum. Normal heart size, mediastinal contours, and pulmonary vascularity. Bibasilar  atelectasis versus infiltrate, greater on LEFT. Upper lungs clear. No pleural effusion or pneumothorax. Osseous structures unremarkable. IMPRESSION: Bibasilar atelectasis versus infiltrate greater on LEFT. Electronically Signed   By: Lavonia Dana M.D.   On: 02/17/2019 16:57   Dg Addison Bailey G Tube Plc W/fl W/rad  Result Date: 02/17/2019 CLINICAL DATA:  Nasogastric tube placement. Recent gastric ulcer repair. Evaluate for leak. EXAM: NASO G TUBE PLACEMENT WITH FL AND WITH RAD; DG UGI W SINGLE CM CONTRAST:  119m OMNIPAQUE IOHEXOL 300 MG/ML  SOLN FLUOROSCOPY TIME:  Fluoroscopy Time:  1 minutes 6 seconds Radiation Exposure Index (if provided by the fluoroscopic device): 11 mGy Number of Acquired Spot Images: 2 COMPARISON:  Upper GI 02/10/2019 and CT abdomen pelvis 02/10/2019. FINDINGS: Nasogastric tube was placed under fluoroscopic guidance with the tip positioned in the gastric cardia. Subsequently, 180 cc Omnipaque 300 was hand injected. No leak. Attempts were made to rotate the patient to help move contrast from the stomach into the proximal small bowel but, due to pain, she was not able to do so. No leak within the opacified portions of the stomach. IMPRESSION: 1. Successful nasogastric tube placement under fluoroscopy. 2. Limited exam of the upper gastrointestinal tract without a leak in the opacified portions of the stomach. Please see discussion above. Electronically Signed   By: MLorin PicketM.D.   On: 02/17/2019 14:49   Dg UDuanne LimerickW Single Cm (sol Or Thin Ba)  Result Date: 02/17/2019 CLINICAL DATA:  Nasogastric tube placement. Recent gastric ulcer repair. Evaluate for leak. EXAM: NASO G TUBE PLACEMENT WITH FL AND WITH RAD; DG UGI W SINGLE CM CONTRAST:  1831mOMNIPAQUE IOHEXOL 300 MG/ML  SOLN FLUOROSCOPY TIME:  Fluoroscopy Time:  1 minutes 6 seconds Radiation  Exposure Index (if provided by the fluoroscopic device): 11 mGy Number of Acquired Spot Images: 2 COMPARISON:  Upper GI 02/10/2019 and CT abdomen pelvis  02/10/2019. FINDINGS: Nasogastric tube was placed under fluoroscopic guidance with the tip positioned in the gastric cardia. Subsequently, 180 cc Omnipaque 300 was hand injected. No leak. Attempts were made to rotate the patient to help move contrast from the stomach into the proximal small bowel but, due to pain, she was not able to do so. No leak within the opacified portions of the stomach. IMPRESSION: 1. Successful nasogastric tube placement under fluoroscopy. 2. Limited exam of the upper gastrointestinal tract without a leak in the opacified portions of the stomach. Please see discussion above. Electronically Signed   By: Lorin Picket M.D.   On: 02/17/2019 14:49    Scheduled Meds:  sodium chloride   Intravenous Once   chlorhexidine gluconate (MEDLINE KIT)  15 mL Mouth Rinse BID   Chlorhexidine Gluconate Cloth  6 each Topical Daily   collagenase   Topical Daily   Gerhardt's butt cream   Topical TID   heparin injection (subcutaneous)  5,000 Units Subcutaneous Q8H   insulin aspart  0-20 Units Subcutaneous Q6H   ipratropium-albuterol  3 mL Nebulization BID   metoprolol tartrate  2.5 mg Intravenous Q6H   pantoprazole (PROTONIX) IV  40 mg Intravenous Q12H   sodium chloride flush  10-40 mL Intracatheter Q12H   sucralfate  1 g Oral TID WC & HS   Continuous Infusions:  sodium chloride 10 mL/hr at 02/18/19 1833   sodium chloride Stopped (02/13/19 2137)   acetaminophen Stopped (02/18/19 1732)   anidulafungin Stopped (02/19/19 1057)   piperacillin-tazobactam (ZOSYN)  IV 12.5 mL/hr at 02/19/19 1220   TPN ADULT (ION) 90 mL/hr at 02/19/19 1220   TPN ADULT (ION)       LOS: 17 days   Time spent: 37mn SBlain Pais MD  02/19/2019, 1:12 PM

## 2019-02-19 NOTE — Consult Note (Signed)
Consultation Note Date: 02/19/2019   Patient Name: Gina [name]  DOB: May 29, 1960  MRN: [id number]  Age / Sex: 58 y.o., female   PCP: Gina Serene, MD Referring Physician: Blain Pais, MD   REASON FOR CONSULTATION:Establishing goals of care  Palliative Care consult requested for goals of care in this 58 y.o. female with multiple medical problems including poorly controlled DM2 with diabetic foot wounds s/p b/l BKA, recent left phalanx amputation, HTN and schizophrenia. She presented to ED with altered mental status, nausea, vomiting and abdominal pain. On work-up patient found to be hypothermic and severely hypotensive. Glucose >1000. 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 Since admission patient underwent exploratory lap on 11/23 with oversew of duodenal ulcer for perforated prepyloric ulcer with contamination. She has now developed a bile leak as confirmed by CT. NG to wall suction. Patient is receiving TNA. As noted by surgery team patient is at high risk for mortality in the setting of repeat operation and multiple co-morbidities.   Clinical Assessment and Goals of Care: I have reviewed medical records including lab results, imaging, Epic notes, and MAR, received report from the bedside RN, and assessed the patient. I met at the bedside with patient, Gina [name] Gina [name], Gina [name] and Gina [name], Gina [name]  to discuss diagnosis prognosis, Gina [name], EOL wishes, disposition and options. Patient is awake, alert, and oriented.   I introduced Palliative Medicine as specialized medical care for people living with serious illness. It focuses on providing relief from the symptoms and stress of a serious illness. The goal is to improve quality of life for both the patient and the family.  We discussed a brief life review of the patient, along with Gina [name]  functional and nutritional status. Family reports patient has 10 siblings. She was originally living alone but recently moved into Gina [name] own place. She has 3 children however Gina [name] Gina [name] oldest Gina [name] is estranged. 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 Is of Christian faith.   Prior to admission family reports patient caring mainly for herself. Gina [name] states patient's health has declined over the past 2 years and more significantly since she began living on Gina [name] own.   We discussed Gina [name] current illness and what it means in the larger context of Gina [name] on-going co-morbidities. With specific discussions regarding Gina [name] recent surgery, bilateral BKA with ischemic changes, bile leak and [name]'s functional and nutritional decline. Natural disease trajectory and expectations at EOL were discussed.   Detailed discussion regarding patients high risk for mortality as noted by surgery team and poor prognosis for meaningful recovery. Family verbalized understanding, however Gina [name] expressed she felt that medical team may be judging patient's condition due to Gina [name] poor health maintenance. I explained at length to Gina [name] patient is not being judged by previous self-care but based on current hospitalizations, performance, and chronic illness.   We discussed at length best case and worst case scenario with best case being patient is stable for some time without surgical intervention. Also with awareness that condition may worsened and patient could potentially be in the same place as now or worst. We also discussed care in the setting patient and family understood the risk and not consider surgical interventions understanding comfort care to be of focused.   Gina [name] shared she has been researching Gina [name] mothers surgery and condition. She discussed concerns regarding Gina [name] nutrition and possibility of continued TPN  long-term or PEG. I educated Gina [name] patient would not be ideal candidate for PEG in the setting of bile  leak making note of NG and content in container. Also discussed limitations and long-term care needs in the setting of TPN. Gina [name] verbalized understanding.   Patient states she is tired but also confused as to what to do. She states she is not afraid of dying speaking of Gina [name] faith in God. She expresses to Gina [name] children she loves them and want to consider all options.   Family request to discuss what hospice/comfort care would look like. Educated patient and family on comfort care in the hospital setting and options for hospice support and care in the home at their facility. Patient asking about Gina [name] siblings being allowed into the hospital if that was their decision. I educated patient and family on COVID protocol and keeping all patients and family safe with awareness in the setting of EOL only 4 family members would be allowed and it would be the same 4. Gina [name] upset with hearing of those guidelines. These protocols are also the same for residential hospice.   Gina [name] is somewhat tearful and states she doesn't know what to do or say. She states she doesn't want to suffer however. Gina [name] became tearful expressing Gina [name] approval for patient to make the decision and not leave it solely to Gina [name].   Of note Gina [name] present during discussion but with no input and occasional phone conversations.   I expressed to family and patient the importance of considering how patient felt and Gina [name] quality of life.   Gina [name] tearful expressing Gina [name] love for Gina [name], wishes for Gina [name] to not consider them and to think about what she wants given she is the one that is having to feel the pain, go through surgery, and live in the condition. Gina [name] expresses to Gina [name] she would be fine if she chose to be comfortable until she went to heaven and did not want Gina [name] to suffer or be in pain. Patient verbalized understanding.   I attempted to elicit values and goals of care important to the patient.    The difference  between aggressive medical intervention and comfort care was considered in light of the patient's goals of care. Patient and family verbalized wishes to continue with current treatments and allow them some time to process all the information received today from providers and pray. Yuleni is requesting to talk with Gina [name] brother who she states is a Theme park manager. Gina [name] called him at the bedside to face time with patient. He was unavailable but planned to call back. I offered spiritual support however patient and family declined at this time. Patient did express the Bonney Roussel has visited Gina [name] on previous days.   Patient requesting family bring Gina [name] bible, anointing oil, and a radio to listen to Gina [name] gospel music.   Advanced directives, concepts specific to code status, artifical feeding and hydration, and rehospitalization were considered and discussed. Patient does not have a documented advanced directive however she states Gina [name] Gina [name] would be Gina [name] main decision maker with support from Gina [name] brother Micheline Rough.   I discussed in detail patient's current full code status with consideration of Gina [name] current illness and co-morbidities. Patient states initially she would not want to undergo heroic measures in the condition she is in. Gina [name] expressed feelings of making hasty decisions and requested they pray on this decision as well. Patient verbalized agreement and requested to remain a full code while she and Gina [name] children think it  over. Gina [name] expresses she would also like to discuss further with patient's siblings.   Gina [name] inquiring on when a decision had to made regarding surgery and code status. I explained palliative could follow up tomorrow for any further questions and support. I also educated patient and family as they are further discussing wishes given patient's condition there is a possibility of further decline prior to making decisions and not to rush them, but with encouragement to have decisions made prior  to an emergent event when emotions are high and decisions are needed at that time to prevent further complications or death. Gina [name] verbalized understanding.   Questions and concerns were addressed. The family was encouraged to call with questions or concerns.  PMT will continue to support holistically.  While walking Gina [name] back out from patient's room she questioned if the doctors had given up and was going to stop treatments on Gina [name] mom since they called me to speak with them. I again educated Gina [name] on what palliative's role is in Gina [name] mom's care and that we were not hospice or there to make changes that are not discussed and agreed upon by patient and or families. Gina [name] verbalized understanding and appreciation.    SOCIAL HISTORY:     reports that she has never smoked. She has never used smokeless tobacco. She reports that she does not drink alcohol or use drugs.  CODE STATUS: Full code  ADVANCE DIRECTIVES: per [name] [name], Danylah Holden is contact person and main Media planner.    SYMPTOM MANAGEMENT: per attending   Palliative Prophylaxis:   Aspiration, Delirium Protocol, Frequent Pain Assessment, Oral Care, Palliative Wound Care and Turn Reposition  PSYCHO-SOCIAL/SPIRITUAL:  Support System: Family   Desire for further Chaplaincy support:NO   Additional Recommendations (Limitations, Scope, Preferences):  Full Scope Treatment   PAST MEDICAL HISTORY: Past Medical History:  Diagnosis Date  . Diabetes mellitus   . Hypertension   . Osteomyelitis of right foot ([place]) 02/22/2017  . Schizophrenia ([place])   . Septic arthritis of interphalangeal joint of toe of left foot ([place]) 06/02/2016  . Stress incontinence 04/26/2017    PAST SURGICAL HISTORY:  Past Surgical History:  Procedure Laterality Date  . AMPUTATION Left 06/05/2016   Procedure: LEFT FOOT TRANSMETATARSAL AMPUTATION;  Surgeon: [name], MD;  Location: [facility];  Service: Orthopedics;  Laterality: Left;   . AMPUTATION Right 11/11/2017   Procedure: AMPUTATION BELOW KNEE;  Surgeon: [name], MD;  Location: [place];  Service: Orthopedics;  Laterality: Right;  . AMPUTATION Left 05/10/2018   Procedure: LEFT BELOW KNEE AMPUTATION;  Surgeon: [name], MD;  Location: [place];  Service: Orthopedics;  Laterality: Left;  . AMPUTATION Left 12/28/2018   Procedure: AMPUTATION THIRD DIGIT;  Surgeon: Iran Planas, MD;  Location: [facility];  Service: Orthopedics;  Laterality: Left;  . AMPUTATION TOE Right 02/23/2017   Procedure: AMPUTATION RIGHT THIRD TOE;  Surgeon: [name], MD;  Location: [place];  Service: Orthopedics;  Laterality: Right;  . I&D EXTREMITY Right 11/09/2017   Procedure: Debride Ulcer Right Heel;  Surgeon: [name], MD;  Location: [place];  Service: Orthopedics;  Laterality: Right;  . I&D EXTREMITY Left 12/28/2018   Procedure: IRRIGATION AND DEBRIDEMENT EXTREMITY;  Surgeon: Iran Planas, MD;  Location: [facility];  Service: Orthopedics;  Laterality: Left;  . INCISION AND DRAINAGE OF WOUND Left 12/26/2018   Procedure: IRRIGATION AND DEBRIDEMENT LEFT HAND;  Surgeon: Iran Planas, MD;  Location: [facility];  Service: Orthopedics;  Laterality: Left;  . LAPAROTOMY N/A 02/02/2019   Procedure: EXPLORATORY LAPAROTOMY WITH OVERSEW OF DUODENAL ULCER;  Surgeon: Alphonsa Overall, MD;  Location: [facility];  Service: General;  Laterality: N/A;  . TUBAL LIGATION      ALLERGIES:  is allergic to bee venom and metformin and related.   MEDICATIONS:  Current Facility-Administered Medications  Medication Dose Route Frequency Provider Last Rate Last Dose  . 0.45 % sodium chloride infusion   Intravenous Continuous Domenic Polite, MD 10 mL/hr at 02/18/19 1833    . 0.9 %  sodium chloride infusion (Manually program via Guardrails IV Fluids)   Intravenous Once Bodenheimer, Charles A, NP      . 0.9 %  sodium chloride infusion  250 mL Intravenous Continuous Bennie Pierini, MD   Stopped at 02/13/19 2137  .  acetaminophen (OFIRMEV) IV 1,000 mg  1,000 mg Intravenous Q6H PRN Domenic Polite, MD   Stopped at 02/18/19 1732  . acetaminophen (TYLENOL) suppository 650 mg  650 mg Rectal Q4H PRN Rigoberto Noel, MD   650 mg at 02/04/19 1238  . anidulafungin (ERAXIS) 100 mg in sodium chloride 0.9 % 100 mL IVPB  100 mg Intravenous Q24H Tommy Medal, Lavell Islam, MD   Stopped at 02/19/19 1057  . chlorhexidine gluconate (MEDLINE KIT) (PERIDEX) 0.12 % solution 15 mL  15 mL Mouth Rinse BID Bennie Pierini, MD   15 mL at 02/19/19 0738  . Chlorhexidine Gluconate Cloth 2 % PADS 6 each  6 each Topical Daily Bennie Pierini, MD   6 each at 02/19/19 [phone]  . collagenase (SANTYL) ointment   Topical Daily Murlean Iba, MD   1 application at 51/70/01 0911  . dextrose 50 % solution 0-50 mL  0-50 mL Intravenous PRN Bennie Pierini, MD   25 mL at 02/07/19 0409  . Gerhardt's butt cream   Topical TID Donita Brooks, NP   1 application at 74/94/49 0911  . heparin injection 5,000 Units  5,000 Units Subcutaneous Q8H Margaretha Seeds, MD   5,000 Units at 02/19/19 0540  . insulin aspart (novoLOG) injection 0-20 Units  0-20 Units Subcutaneous Q6H Johnson, Clanford L, MD   4 Units at 02/19/19 1159  . ipratropium-albuterol (DUONEB) 0.5-2.5 (3) MG/3ML nebulizer solution 3 mL  3 mL Nebulization BID Domenic Polite, MD   3 mL at 02/19/19 0930  . lip balm (CARMEX) ointment   Topical PRN Margaretha Seeds, MD      . metoprolol tartrate (LOPRESSOR) injection 2.5 mg  2.5 mg Intravenous Q6H Johnson, Clanford L, MD   2.5 mg at 02/19/19 1201  . morphine 2 MG/ML injection 1-2 mg  1-2 mg Intravenous Q3H PRN Arby Barrette A, NP   2 mg at 02/19/19 0540  . ondansetron (ZOFRAN) injection 4 mg  4 mg Intravenous Q6H PRN Bennie Pierini, MD   4 mg at 02/16/19 1334  . pantoprazole (PROTONIX) injection 40 mg  40 mg Intravenous Q12H Alphonsa Overall, MD   40 mg at 02/19/19 0906  . piperacillin-tazobactam (ZOSYN) IVPB 3.375 g  3.375 g  Intravenous Q8H Saverio Danker, PA-C 12.5 mL/hr at 02/19/19 1220    . sodium chloride flush (NS) 0.9 % injection 10-40 mL  10-40 mL Intracatheter Q12H [name], MD   10 mL at 02/19/19 0911  . sodium chloride flush (NS) 0.9 % injection 10-40 mL  10-40 mL Intracatheter PRN Rizwan, Eunice Blase, MD      . sucralfate (CARAFATE) 1 GM/10ML suspension 1  g  1 g Oral TID WC & HS Ileana Roup, MD   1 g at 02/16/19 1621  . TPN ADULT (ION)   Intravenous Continuous TPN Royetta Asal, RPH 90 mL/hr at 02/19/19 1220    . TPN ADULT (ION)   Intravenous Continuous TPN Adele Barthel D, MD        VITAL SIGNS: BP (!) 151/65   Pulse 93   Temp 98.1 F (36.7 C) (Oral)   Resp 16   Ht 5' 2"  (1.575 m)   Wt 61.4 kg   LMP 03/29/2015   SpO2 96%   BMI 24.76 kg/m  Filed Weights   02/17/19 0500 02/18/19 0500 02/19/19 0500  Weight: 61.9 kg 60.4 kg 61.4 kg    Estimated body mass index is 24.76 kg/m as calculated from the following:   Height as of this encounter: 5' 2"  (1.575 m).   Weight as of this encounter: 61.4 kg.  LABS: CBC:    Component Value Date/Time   WBC 6.7 02/18/2019 0155   HGB 7.8 (L) 02/18/2019 0155   HGB 10.2 (L) 11/01/2017 1639   HCT 25.3 (L) 02/18/2019 0155   HCT 31.3 (L) 11/01/2017 1639   PLT 322 02/18/2019 0155   PLT 257 11/01/2017 1639   Comprehensive Metabolic Panel:    Component Value Date/Time   NA 134 (L) 02/19/2019 0427   NA 139 10/04/2017 1705   K 4.9 02/19/2019 0427   CO2 21 (L) 02/19/2019 0427   BUN 36 (H) 02/19/2019 0427   BUN 11 10/04/2017 1705   CREATININE 0.99 02/19/2019 0427   CREATININE 0.68 04/28/2016 1640   ALBUMIN 1.2 (L) 02/17/2019 0522   ALBUMIN 3.7 10/04/2017 1705     Review of Systems  Constitutional: Positive for fatigue.  Gastrointestinal: Positive for abdominal pain.  Neurological: Positive for weakness.  Unless otherwise noted, a complete review of systems is negative.  Physical Exam General: NAD, frail chronically-ill appearing,  thin Cardiovascular: regular rate and rhythm Pulmonary: diminished bilaterally  Abdomen: soft, tender, + bowel sounds, NGT with bile output, abdominal dressing clean, dry, intact, JP drain Extremities: generalized edema, ischemic changes Skin: bilateral BKA with dressings intact Neurological: A&O x3, mood appropriate, will follow command.    Prognosis: Poor in the setting of severe protein calorie malnutrition, uncontrolled diabetes, ischemic changes in bilateral BKA requiring bilateral AKA, remains at risk of additional complications and decline, intra-abdominal infection, debilitated, NG tube with bilious drainage  Discharge Planning:  To Be Determined  Recommendations:  Full Code-as requested by patient and family  Continue with current plan of care per medical team  Gina [name] was given work excuse for today's visit. Inquired about FMLA in the setting of transitioning to comfort/hospice. Explained palliative or medical team would assist with paperwork if needed.   Patient and family requesting time to process all information and updates from medical team. Stating they need to think and pray before making final decisions.   PMT will continue to support and follow.    Palliative Performance Scale: NGT                Patient and family expressed understanding and was in agreement with this plan.   Thank you for allowing the Palliative Medicine Team to assist in the care of this patient.  Time In: 1130 Time Out: 1245 Time Total: 75 min.   Visit consisted of counseling and education dealing with the complex and emotionally intense issues of symptom management and palliative care in the setting  of serious and potentially life-threatening illness.Greater than 50%  of this time was spent counseling and coordinating care related to the above assessment and plan.  Signed by:  Alda Lea, AGPCNP-BC Palliative Medicine Team  Phone: [phone] Fax: [phone]  Pager: [phone] Amion: Bjorn Pippin

## 2019-02-19 NOTE — Progress Notes (Signed)
Patient ID: Gina [name], female   DOB: 05/08/1960, 58 y.o.   MRN: [id number]    17 Days Post-Op  Subjective: Patient awake and alert today.  Still have a hard time understanding much of what she is saying.  Her bilious output is still high but has decreases a little bit overnight.  ROS: See above, otherwise other systems negative  Objective: Vital signs in last 24 hours: Temp:  [96.2 F (35.7 C)-102 F (38.9 C)] 99.1 F (37.3 C) (12/09 0556) Pulse Rate:  [86-107] 97 (12/09 0800) Resp:  [10-23] 16 (12/09 0800) BP: (120-177)/(52-71) 145/66 (12/09 0800) SpO2:  [95 %] 95 % (12/08 1923) Weight:  [61.4 kg] 61.4 kg (12/09 0500) Last BM Date: 02/19/19  Intake/Output from previous day: 12/08 0701 - 12/09 0700 In: 1975.8 [I.V.:1914.3; IV Piggyback:61.5] Out: [id number] [Urine:1350; Emesis/NG output:100; Drains:1775] Intake/Output this shift: Total I/O In: 260.3 [I.V.:148.5; IV Piggyback:111.8] Out: 125 [Drains:75; Stool:50]  PE: Abd: soft, mildly tender, but no peritonitis or guarding, JP drain in place with bilious output, but slowing down compared to yesterday (1700cc yesterday), NGT in place with some bilious output, about 300cc.  Midline wound is actually healing well with good pink tissue.  Fascia is intact  Lab Results:  Recent Labs    02/17/19 0522 02/18/19 0155  WBC 7.2 6.7  HGB 7.6* 7.8*  HCT 24.8* 25.3*  PLT 341 322   BMET Recent Labs    02/18/19 0155 02/19/19 0427  NA 136 134*  K 4.8 4.9  CL 109 107  CO2 19* 21*  GLUCOSE 118* 142*  BUN 33* 36*  CREATININE 1.14* 0.99  CALCIUM 8.3* 8.2*   PT/INR No results for input(s): LABPROT, INR in the last 72 hours. CMP     Component Value Date/Time   NA 134 (L) 02/19/2019 0427   NA 139 10/04/2017 1705   K 4.9 02/19/2019 0427   CL 107 02/19/2019 0427   CO2 21 (L) 02/19/2019 0427   GLUCOSE 142 (H) 02/19/2019 0427   BUN 36 (H) 02/19/2019 0427   BUN 11 10/04/2017 1705   CREATININE 0.99 02/19/2019 0427   CREATININE  0.68 04/28/2016 1640   CALCIUM 8.2 (L) 02/19/2019 0427   PROT 5.8 (L) 02/17/2019 0522   PROT 7.1 10/04/2017 1705   ALBUMIN 1.2 (L) 02/17/2019 0522   ALBUMIN 3.7 10/04/2017 1705   AST 9 (L) 02/17/2019 0522   ALT 9 02/17/2019 0522   ALKPHOS 54 02/17/2019 0522   BILITOT 0.4 02/17/2019 0522   BILITOT <0.2 10/04/2017 1705   GFRNONAA >60 02/19/2019 0427   GFRNONAA >89 04/28/2016 1640   GFRAA >60 02/19/2019 0427   GFRAA >89 04/28/2016 1640   Lipase     Component Value Date/Time   LIPASE 27 04/01/2016 1040       Studies/Results: Ct Abdomen Pelvis W Contrast  Result Date: 02/18/2019 CLINICAL DATA:  Acute abdominal pain. Status post pre pyloric compared gastric ulcer perforation. EXAM: CT ABDOMEN AND PELVIS WITH CONTRAST TECHNIQUE: Multidetector CT imaging of the abdomen and pelvis was performed using the standard protocol following bolus administration of intravenous contrast. CONTRAST:  174mL OMNIPAQUE IOHEXOL 300 MG/ML  SOLN COMPARISON:  CT ABDOMEN 02/10/2019, UPPER GI 02/17/2019 FINDINGS: Lower chest: DENSE BIBASILAR ATELECTASIS WITH AIR BRONCHOGRAMS. SMALL BILATERAL PLEURAL EFFUSIONS. NO PULMONARY EDEMA. Hepatobiliary: Subcapsular fluid collection along the RIGHT hepatic lobe measures 5.5 by 1.3 cm and is smaller than comparison exam. Percutaneous drainage catheter enters the RIGHT abdominal wall adjacent to the RIGHT hepatic lobe. Catheter terminates  adjacent the porta hepatis. No fluid collections associated with catheter. The gallbladder is collapsed around gallstones. No biliary duct dilatation. Pancreas: Pancreas is normal. No ductal dilatation. No pancreatic inflammation. Spleen: Normal spleen Adrenals/urinary tract: Adrenal glands are normal. Kidneys enhance symmetrically. There is contrast within mildly distended bladder. Small a gas bladder. Stomach/Bowel: NG tube extends the stomach. There is a large volume of high-density fluid within the ventral peritoneal space which appears  associated with abnormal loops of small bowel. There is mild pneumatosis with the loops of small bowel (a image 47/3). The walls of the bowel are not well identified. This high-density collection which is favored to be within the loops small bowel suggest flaccid bowel with limited peristalsis resulting in a matted collection of contrast. Contrast does flow into the distal small bowel bowel and colon. Vascular/Lymphatic: Abdominal aorta is normal caliber. No periportal or retroperitoneal adenopathy. No pelvic adenopathy. Reproductive: Endometrium is mildly expanded to 8 mm Other: There several pockets of intraperitoneal free fluid with enhancing rim. 1 pocket is posterior to the uterus measuring 5.4 x 3.5 cm. Similar smaller pocket in the peritoneal space ventrally on the LEFT measures 1.5 x 2.5 cm sagittal image 90/126. Musculoskeletal: No aggressive osseous lesion. IMPRESSION: 1. Stasis of oral contrast within the mid small bowel which floats in the central abdomen. The loops of bowel are difficult to discern from each and there is pneumatosis within several loops of bowel. Findings concerning for flaccid bowel/ischemic bowel. Recommend clinical correlation and surgical consultation. 2. The above contrast collection in the ventral abdomen is not favored to be related to gastric perforation as there is no high-density intraperitoneal fluid elsewhere and no significant intraperitoneal free air. 3. Several pockets of fluid within the peritoneal space with thin enhancing rims consistent with peritonitis. 4. Reduction in subcapsular fluid collection along the RIGHT hepatic lobe which may represent a biloma. 5. Dense bibasilar atelectasis. 1.  Findings conveyed toKELLY [name] on 02/18/2019  at17:24. Electronically Signed   By: Suzy Bouchard M.D.   On: 02/18/2019 17:30   Dg Chest Port 1 View  Result Date: 02/17/2019 CLINICAL DATA:  Acute respiratory distress, hypertension, diabetes mellitus, post breathing  treatment EXAM: PORTABLE CHEST 1 VIEW COMPARISON:  Portable exam 1648 hours compared to 02/06/2019 FINDINGS: RIGHT arm PICC line tip projects over confluence of SVC. Nasogastric tube extends into stomach. Retained contrast in stomach and proximal jejunum. Normal heart size, mediastinal contours, and pulmonary vascularity. Bibasilar atelectasis versus infiltrate, greater on LEFT. Upper lungs clear. No pleural effusion or pneumothorax. Osseous structures unremarkable. IMPRESSION: Bibasilar atelectasis versus infiltrate greater on LEFT. Electronically Signed   By: Lavonia Dana M.D.   On: 02/17/2019 16:57   Dg Addison Bailey G Tube Plc W/fl W/rad  Result Date: 02/17/2019 CLINICAL DATA:  Nasogastric tube placement. Recent gastric ulcer repair. Evaluate for leak. EXAM: NASO G TUBE PLACEMENT WITH FL AND WITH RAD; DG UGI W SINGLE CM CONTRAST:  131mL OMNIPAQUE IOHEXOL 300 MG/ML  SOLN FLUOROSCOPY TIME:  Fluoroscopy Time:  1 minutes 6 seconds Radiation Exposure Index (if provided by the fluoroscopic device): 11 mGy Number of Acquired Spot Images: 2 COMPARISON:  Upper GI 02/10/2019 and CT abdomen pelvis 02/10/2019. FINDINGS: Nasogastric tube was placed under fluoroscopic guidance with the tip positioned in the gastric cardia. Subsequently, 180 cc Omnipaque 300 was hand injected. No leak. Attempts were made to rotate the patient to help move contrast from the stomach into the proximal small bowel but, due to pain, she was not able to  do so. No leak within the opacified portions of the stomach. IMPRESSION: 1. Successful nasogastric tube placement under fluoroscopy. 2. Limited exam of the upper gastrointestinal tract without a leak in the opacified portions of the stomach. Please see discussion above. Electronically Signed   By: Lorin Picket M.D.   On: 02/17/2019 14:49   Dg Duanne Limerick W Single Cm (sol Or Thin Ba)  Result Date: 02/17/2019 CLINICAL DATA:  Nasogastric tube placement. Recent gastric ulcer repair. Evaluate for leak. EXAM:  NASO G TUBE PLACEMENT WITH FL AND WITH RAD; DG UGI W SINGLE CM CONTRAST:  163mL OMNIPAQUE IOHEXOL 300 MG/ML  SOLN FLUOROSCOPY TIME:  Fluoroscopy Time:  1 minutes 6 seconds Radiation Exposure Index (if provided by the fluoroscopic device): 11 mGy Number of Acquired Spot Images: 2 COMPARISON:  Upper GI 02/10/2019 and CT abdomen pelvis 02/10/2019. FINDINGS: Nasogastric tube was placed under fluoroscopic guidance with the tip positioned in the gastric cardia. Subsequently, 180 cc Omnipaque 300 was hand injected. No leak. Attempts were made to rotate the patient to help move contrast from the stomach into the proximal small bowel but, due to pain, she was not able to do so. No leak within the opacified portions of the stomach. IMPRESSION: 1. Successful nasogastric tube placement under fluoroscopy. 2. Limited exam of the upper gastrointestinal tract without a leak in the opacified portions of the stomach. Please see discussion above. Electronically Signed   By: Lorin Picket M.D.   On: 02/17/2019 14:49    Anti-infectives: Anti-infectives (From admission, onward)   Start     Dose/Rate Route Frequency Ordered Stop   02/18/19 1800  piperacillin-tazobactam (ZOSYN) IVPB 3.375 g     3.375 g 12.5 mL/hr over 240 Minutes Intravenous Every 8 hours 02/18/19 1652     02/05/19 1000  anidulafungin (ERAXIS) 100 mg in sodium chloride 0.9 % 100 mL IVPB     100 mg 78 mL/hr over 100 Minutes Intravenous Every 24 hours 02/04/19 0840     02/04/19 1000  anidulafungin (ERAXIS) 200 mg in sodium chloride 0.9 % 200 mL IVPB     200 mg 78 mL/hr over 200 Minutes Intravenous  Once 02/04/19 0840 02/04/19 1301   02/04/19 0200  fluconazole (DIFLUCAN) IVPB 200 mg  Status:  Discontinued     200 mg 100 mL/hr over 60 Minutes Intravenous Every 24 hours 02/02/19 2302 02/04/19 0840   02/03/19 1800  vancomycin (VANCOCIN) IVPB 750 mg/150 ml premix  Status:  Discontinued     750 mg 150 mL/hr over 60 Minutes Intravenous Every 24 hours 02/03/19  0241 02/03/19 0242   02/03/19 1800  vancomycin (VANCOCIN) IVPB 750 mg/150 ml premix  Status:  Discontinued     750 mg 150 mL/hr over 60 Minutes Intravenous Every 24 hours 02/03/19 0242 02/03/19 0858   02/03/19 0600  piperacillin-tazobactam (ZOSYN) IVPB 3.375 g     3.375 g 12.5 mL/hr over 240 Minutes Intravenous Every 8 hours 02/03/19 0243 02/17/19 0205   02/02/19 2315  fluconazole (DIFLUCAN) IVPB 400 mg     400 mg 100 mL/hr over 120 Minutes Intravenous STAT 02/02/19 2301 02/03/19 0218   02/02/19 1800  ceFEPIme (MAXIPIME) 2 g in sodium chloride 0.9 % 100 mL IVPB     2 g 200 mL/hr over 30 Minutes Intravenous  Once 02/02/19 1745 02/02/19 2030   02/02/19 1800  metroNIDAZOLE (FLAGYL) IVPB 500 mg     500 mg 100 mL/hr over 60 Minutes Intravenous  Once 02/02/19 1745 02/02/19 2237   02/02/19  1800  vancomycin (VANCOCIN) IVPB 1000 mg/200 mL premix  Status:  Discontinued     1,000 mg 200 mL/hr over 60 Minutes Intravenous  Once 02/02/19 1745 02/02/19 1747   02/02/19 1800  vancomycin (VANCOCIN) 1,250 mg in sodium chloride 0.9 % 250 mL IVPB     1,250 mg 166.7 mL/hr over 90 Minutes Intravenous  Once 02/02/19 1747 02/02/19 2237       Assessment/Plan VDRF- extubated 11/29 Poorly controlled DM2- A1c15.2 S/p bilateral BKA Ischemic changes to both LE- defer to medicine,but would benefit from eval from Dr. 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  No plans for sharp debridement at this time.  POD17,S/pex lap with oversew of duodenal ulcer,with Graham patch 11/23,Dr. Lucia Gaskins, for perforated prepyloric ulcer  with gross contamination -now with apparent bile leak, although UGI negative but with limitations.  CT scan also confirms leak but unable to tell where from.  Small bowel appears flaccid and not healthy, but given significant high density material any specific details are difficult to conclude. - BID wet to dry dressing changes to midline abdominal wound - BID PPI; added carafate -pulm toilet and IS -OOB to chair as much as possible -mobilize, PT/OT -cont TNA given leak, will be unable to take in enteral nutrition for the foreseeable future -had a long discussion with her daughter Gina [name] today on the phone as her brother is the visitor that is able to come into the hospital.  I explained everything going on with the patient right now and her recent CT scan results.  We discussed that the patient's risk for a repeat operation at this point is very high and I don't think she will tolerate that well with a much higher likelihood of mortality.  However, we discussed that her poor nutrition, uncontrolled DM, deconditioning, etc, would make this leak very difficult to heal, but that she is stable and that is likely her best option at this point.  We discussed that that means she will be unable to eat until this is healed and that is may not heal spontaneously.  That if this is still going on in 6+ months, that she may require another operation to fix this.  However, I was concerned with all of her other medical problems that this would be a very long and tough road for the patient to recover from.  We discussed the need for palliative care to see them to discuss goals of care.  The daughter is very receptive to this.  Her biggest request is to be able to come see her mother.  She is having a very difficult time with a disconnect in her own mind of fulling understanding what we are saying to her without being able to see her mother.  I told her that I would try to work with palliative care and the nursing  staff to see if we could arrange for her visit maybe during a family meeting that will hopefully be set up.  She was very [employer].  ID - Eraxis 11/24>>Zosyn 11/23 >>12/6, 12/8 --> FEN -NPO/NGT VTE - NoSCDs,Bilateral BKa's - sq heparin  Foley -out Follow up -Dr. Lucia Gaskins   LOS:  17 days    [facility] 02/19/2019, 8:40 AM Please see Amion for pager number during day hours 7:00am-4:30pm

## 2019-02-20 DIAGNOSIS — B49 Unspecified mycosis: Secondary | ICD-10-CM

## 2019-02-20 LAB — CBC
HCT: 23 % — ABNORMAL LOW (ref 36.0–46.0)
Hemoglobin: 7 g/dL — ABNORMAL LOW (ref 12.0–15.0)
MCH: 28.6 pg (ref 26.0–34.0)
MCHC: 30.4 g/dL (ref 30.0–36.0)
MCV: 93.9 fL (ref 80.0–100.0)
Platelets: 356 10*3/uL (ref 150–400)
RBC: 2.45 MIL/uL — ABNORMAL LOW (ref 3.87–5.11)
RDW: 15.9 % — ABNORMAL HIGH (ref 11.5–15.5)
WBC: 9.7 10*3/uL (ref 4.0–10.5)
nRBC: 0.4 % — ABNORMAL HIGH (ref 0.0–0.2)

## 2019-02-20 LAB — HEMOGLOBIN AND HEMATOCRIT, BLOOD
HCT: 23.5 % — ABNORMAL LOW (ref 36.0–46.0)
Hemoglobin: 7.4 g/dL — ABNORMAL LOW (ref 12.0–15.0)

## 2019-02-20 LAB — COMPREHENSIVE METABOLIC PANEL
ALT: 8 U/L (ref 0–44)
AST: 15 U/L (ref 15–41)
Albumin: 1 g/dL — ABNORMAL LOW (ref 3.5–5.0)
Alkaline Phosphatase: 72 U/L (ref 38–126)
Anion gap: 8 (ref 5–15)
BUN: 38 mg/dL — ABNORMAL HIGH (ref 6–20)
CO2: 21 mmol/L — ABNORMAL LOW (ref 22–32)
Calcium: 8.4 mg/dL — ABNORMAL LOW (ref 8.9–10.3)
Chloride: 106 mmol/L (ref 98–111)
Creatinine, Ser: 0.93 mg/dL (ref 0.44–1.00)
GFR calc Af Amer: >60 mL/min (ref >60)
GFR calc non Af Amer: >60 mL/min (ref >60)
Glucose, Bld: 146 mg/dL — ABNORMAL HIGH (ref 70–99)
Potassium: 4.6 mmol/L (ref 3.5–5.1)
Sodium: 135 mmol/L (ref 135–145)
Total Bilirubin: 0.4 mg/dL (ref 0.3–1.2)
Total Protein: 5.8 g/dL — ABNORMAL LOW (ref 6.5–8.1)

## 2019-02-20 LAB — GLUCOSE, CAPILLARY
Glucose-Capillary: 167 mg/dL — ABNORMAL HIGH (ref 70–99)
Glucose-Capillary: 168 mg/dL — ABNORMAL HIGH (ref 70–99)
Glucose-Capillary: 161 mg/dL — ABNORMAL HIGH (ref 70–99)

## 2019-02-20 LAB — MAGNESIUM: Magnesium: 2.2 mg/dL (ref 1.7–2.4)

## 2019-02-20 LAB — PHOSPHORUS: Phosphorus: 3.8 mg/dL (ref 2.5–4.6)

## 2019-02-20 MED ORDER — TRAVASOL 10 % IV SOLN
INTRAVENOUS | Status: AC
  Administered 2019-02-20: 18:00:00 via INTRAVENOUS
  Filled 2019-02-20: qty 1296

## 2019-02-20 NOTE — Progress Notes (Addendum)
°PROGRESS NOTE ° ° °Gina [name]   °MRN:[id number]  °DOB: 12/19/1960  °DOA: 02/02/2019 °PCP: Lamb, Andrew S, MD  ° °Brief Narrative: PCCM transferred to [facility] 11/30 °-The patient is a 58-year-old female with severe protein calorie malnutrition, poorly controlled type 2 diabetes mellitus with bilateral foot ulcers, status post bilateral below-knee knee amputation, recent left phalanx amputation, schizophrenia, hypertension, severe protein calorie malnutrition was admitted to the ICU with septic shock , she was found to have a perforated gastric ulcer with pneumoperitoneum, also had evidence of DKA on admission -she was intubated, admitted to the ICU, treated with broad-spectrum antibiotics, pressors, aggressive fluid resuscitation, ultimately underwent ex lap on 11/23 which noted a perforated prepyloric ulcer which was oversewn and 3.1 L of contaminated peritoneal fluid was drained, she was subsequently extubated on 11/29. °-She has had a extensive complicated hospital course notable for DKA, acute kidney injury, acute blood loss anemia, acute thrombocytopenia, intra-abdominal sepsis, bacteremia, fungemia. °-General surgery continues to follow closely, NG tube was removed on 11/25, currently on TPN, during this hospital stay was also noted to have severe ischemic changes at her BKA site, vascular surgery as well as Dr. Duda were consulted, they recommended consideration of above-knee amputation when patient is more stable °-Palliative medicine team was consulted on 12/7 ° °Subjective: °-No events overnight. She denies pain. She is tolerating TPN.  ° °Assessment & Plan: ° °Severe septic shock due to ruptured gastric ulcer with acute peritonitis, fungemia and UTI °- ID consulted on 02/04/19 and following.  °- Blood cultures from 11/22> Candida Glabrata- repeat blood culture 11/24 NGTD °- Urine culture 11/22 > K pneumoniae °- Peritoneal fluid 11/23> Candida albicans °- Opthalmology consulted - no evidence of fungal  endophthalmitis  °- currently receiving Andulafungin and Zosyn °- ID has recommended to remove central line to give line holiday and right IJ was removed on 12/1 °- placed PICC 12/3- Gen surgery resumed TPN °-02/15/2019: Sepsis physiology has resolved significantly.  Infectious disease team is managing fungemia and intra-abdominal infection, antifungal and antibiotics per their recommendations.  °-General surgery following, from the information from Palliative care, it appears the family needs an update from the Surgical team in regards to the surgery options for the patient.  ° °Ischemic changes at bilateral BKA site °-This is secondary to severe septic shock, required pressors.  °-Seen by vascular surgery Dr. Dickson and orthopedics Dr. Duda, they feel she will eventually require an above-knee amputation when she is more stable, patient declines and defers it at this time. ° ° °Acute kidney injury - improving °- baseline Cr is < 1.0 °- Cr rose to 2.66 on 11/26, likely from ATN, now improving down to 0.99 this morning ° °ICU delirium °-Likely secondary to all above infections, mental status improving slowly °-Remains on as needed Haldol however has not required this in several days ° °  DKA (diabetic ketoacidoses), severely uncontrolled with hyperglycemia  °- resolved °- A1c on 11/23>  15.2 °-Continue Lantus and sliding scale, CBGs are stable now ° °Hypokalemia °- replete as needed, recheck level in am.  ° °Severe protein calorie malnutrition with hypoalbuminemia °02/15/2019: Patient still on TPN.  NG tube has been discontinued.  Currently on ice chips. °-SLP is following and will assist in diet advancement.  ° °Anemia due to acute blood loss, critical illness °- Has required 2 units of PRBC this admission °-Hgb dropped to 7 today, repeat Hgb and Hct.  °-Will repeat labs in am, transfuse if Hgb <7. ° ° °Acute thrombocytopenia °-   related to septic shock °- nadir was > 31 on 11/27 °- Platelets have risen back to normal   ° °Elevated HCV antibody °- 11/25 - HCV > 11.0 ° °Left phalanx amputation  °- on 12/28/18- Hand surgery contacted by general surgery on 11/30 to request suture removal.  Dr. Johnson spoke to Dr. Ortmann 12/7 who said that RN can remove sutures and place picture in chart and he will review.   °-Sutures were removed on 12/9 by RN with no complications.  ° °S/p Bl BKA ° °Ethics: This is a seriously ill patient with the extremely sick and complicated hospital course with fungemia, intra-abdominal infection following perforation, severe protein calorie malnutrition, uncontrolled diabetes, ischemic changes in bilateral BKA requiring bilateral AKA, remains at risk of additional complications and decline °-Palliative medicine team was consulted on 12/7 for goals of care, setting meeting with family, appreciate recommendations.  ° ° °Time spent in minutes: 35 min  °DVT prophylaxis: heparin °Code Status: Full code °Family Communication: None at bedside °Disposition Plan: Stay in stepdown ° °Consultants:  °· General surgery °· ID °· Palliative Care  °Procedures:  °· Intubation/ Extubation °· Ex lap °Antimicrobials:  °Anti-infectives (From admission, onward)  ° Start     Dose/Rate Route Frequency Ordered Stop  ° 02/18/19 1800  piperacillin-tazobactam (ZOSYN) IVPB 3.375 g    ° 3.375 g °12.5 mL/hr over 240 Minutes Intravenous Every 8 hours 02/18/19 1652    ° 02/05/19 1000  anidulafungin (ERAXIS) 100 mg in sodium chloride 0.9 % 100 mL IVPB    ° 100 mg °78 mL/hr over 100 Minutes Intravenous Every 24 hours 02/04/19 0840    ° 02/04/19 1000  anidulafungin (ERAXIS) 200 mg in sodium chloride 0.9 % 200 mL IVPB    ° 200 mg °78 mL/hr over 200 Minutes Intravenous  Once 02/04/19 0840 02/04/19 1301  ° 02/04/19 0200  fluconazole (DIFLUCAN) IVPB 200 mg  Status:  Discontinued    ° 200 mg °100 mL/hr over 60 Minutes Intravenous Every 24 hours 02/02/19 2302 02/04/19 0840  ° 02/03/19 1800  vancomycin (VANCOCIN) IVPB 750 mg/150 ml premix  Status:   Discontinued    ° 750 mg °150 mL/hr over 60 Minutes Intravenous Every 24 hours 02/03/19 0241 02/03/19 0242  ° 02/03/19 1800  vancomycin (VANCOCIN) IVPB 750 mg/150 ml premix  Status:  Discontinued    ° 750 mg °150 mL/hr over 60 Minutes Intravenous Every 24 hours 02/03/19 0242 02/03/19 0858  ° 02/03/19 0600  piperacillin-tazobactam (ZOSYN) IVPB 3.375 g    ° 3.375 g °12.5 mL/hr over 240 Minutes Intravenous Every 8 hours 02/03/19 0243 02/17/19 0205  ° 02/02/19 2315  fluconazole (DIFLUCAN) IVPB 400 mg    ° 400 mg °100 mL/hr over 120 Minutes Intravenous STAT 02/02/19 2301 02/03/19 0218  ° 02/02/19 1800  ceFEPIme (MAXIPIME) 2 g in sodium chloride 0.9 % 100 mL IVPB    ° 2 g °200 mL/hr over 30 Minutes Intravenous  Once 02/02/19 1745 02/02/19 2030  ° 02/02/19 1800  metroNIDAZOLE (FLAGYL) IVPB 500 mg    ° 500 mg °100 mL/hr over 60 Minutes Intravenous  Once 02/02/19 1745 02/02/19 2237  ° 02/02/19 1800  vancomycin (VANCOCIN) IVPB 1000 mg/200 mL premix  Status:  Discontinued    ° 1,000 mg °200 mL/hr over 60 Minutes Intravenous  Once 02/02/19 1745 02/02/19 1747  ° 02/02/19 1800  vancomycin (VANCOCIN) 1,250 mg in sodium chloride 0.9 % 250 mL IVPB    ° 1,250 mg °166.7 mL/hr over   90 Minutes Intravenous  Once 02/02/19 1747 02/02/19 2237  °  ° ° °Objective: °Vitals:  ° 02/20/19 0900 02/20/19 1000 02/20/19 1100 02/20/19 1200  °BP: (!) 156/68 (!) 157/72 (!) 150/66 (!) 159/68  °Pulse: 87 87 86 76  °Resp: 11 15 10 19  °Temp:      °TempSrc:      °SpO2:      °Weight:      °Height:      ° ° °Intake/Output Summary (Last 24 hours) at 02/20/2019 1238 °Last data filed at 02/20/2019 1153 °Gross per 24 hour  °Intake 2454.57 ml  °Output 1750 ml  °Net 704.57 ml  ° °Filed Weights  ° 02/18/19 0500 02/19/19 0500 02/20/19 0520  °Weight: 60.4 kg 61.4 kg 59.5 kg  ° ° °Examination: °Gen: Extremely cachectic chronically ill-appearing female laying in bed awake alert, answers "yes" or "no" to questions, no distress °HEENT: PERRLA, Neck supple, no JVD °Lungs:  No respiratory distress, no wheezing.  °CVS: RRR °Abd: Soft, mild diffuse tenderness, mildly distended. JP drain in place.  °Extremities: Bilateral BKA with ischemic changes °skin: Ischemic changes, discolorations noted in both lower legs near BKA site °Flexi-Seal noted with stool content. ° °Data Reviewed: I have personally reviewed following labs and imaging studies ° °CBC: °Recent Labs  °Lab 02/14/19 °0209 02/16/19 °0254 02/16/19 °1809 02/17/19 °0522 02/18/19 °0155 02/20/19 °0254  °WBC 5.5 5.2  --  7.2 6.7 9.7  °NEUTROABS 3.9 3.5  --  4.5 4.5  --   °HGB 7.4* 6.1* 7.9* 7.6* 7.8* 7.0*  °HCT 24.6* 19.9* 25.0* 24.8* 25.3* 23.0*  °MCV 94.3 94.3  --  94.3 95.1 93.9  °PLT 296 338  --  341 322 356  ° °Basic Metabolic Panel: °Recent Labs  °Lab 02/16/19 °0254 02/17/19 °0522 02/18/19 °0155 02/19/19 °0427 02/20/19 °0254  °NA 138   138 136 136 134* 135  °K 4.2   4.2 4.6 4.8 4.9 4.6  °CL 109   109 106 109 107 106  °CO2 21*   21* 19* 19* 21* 21*  °GLUCOSE 253*   253* 196* 118* 142* 146*  °BUN 30*   30* 32* 33* 36* 38*  °CREATININE 1.23*   1.34* 1.24* 1.14* 0.99 0.93  °CALCIUM 7.9*   7.9* 8.3* 8.3* 8.2* 8.4*  °MG 1.9 1.8 1.9 2.1 2.2  °PHOS 2.6   2.6 2.6 3.2 3.9 3.8  ° °GFR: °Estimated Creatinine Clearance: 52.8 mL/min (by C-G formula based on SCr of 0.93 mg/dL). °Liver Function Tests: °Recent Labs  °Lab 02/14/19 °0209 02/16/19 °0254 02/17/19 °0522 02/20/19 °0254  °AST 9*  --  9* 15  °ALT 12  --  9 8  °ALKPHOS 56  --  54 72  °BILITOT 0.6  --  0.4 0.4  °PROT 4.9*  --  5.8* 5.8*  °ALBUMIN 1.2* 1.2* 1.2* 1.0*  ° °No results for input(s): LIPASE, AMYLASE in the last 168 hours. °No results for input(s): AMMONIA in the last 168 hours. °Coagulation Profile: °No results for input(s): INR, PROTIME in the last 168 hours. °Cardiac Enzymes: °No results for input(s): CKTOTAL, CKMB, CKMBINDEX, TROPONINI in the last 168 hours. °BNP (last 3 results) °No results for input(s): PROBNP in the last 8760 hours. °HbA1C: °No results for input(s): HGBA1C  in the last 72 hours. °CBG: °Recent Labs  °Lab 02/18/19 °2359 02/19/19 °1142 02/19/19 °1713 02/19/19 °2335 02/20/19 °0550  °GLUCAP 149* 156* 141* 165* 167*  ° °Lipid Profile: °No results for input(s): CHOL, HDL, LDLCALC, TRIG, CHOLHDL, LDLDIRECT   in the last 72 hours. °Thyroid Function Tests: °No results for input(s): TSH, T4TOTAL, FREET4, T3FREE, THYROIDAB in the last 72 hours. °Anemia Panel: °No results for input(s): VITAMINB12, FOLATE, FERRITIN, TIBC, IRON, RETICCTPCT in the last 72 hours. °Urine analysis: °   °Component Value Date/Time  ° COLORURINE YELLOW 02/12/2019 1401  ° APPEARANCEUR CLOUDY (A) 02/12/2019 1401  ° LABSPEC 1.010 02/12/2019 1401  ° PHURINE 5.0 02/12/2019 1401  ° GLUCOSEU >=500 (A) 02/12/2019 1401  ° HGBUR MODERATE (A) 02/12/2019 1401  ° BILIRUBINUR NEGATIVE 02/12/2019 1401  ° BILIRUBINUR N 04/28/2016 1603  ° KETONESUR NEGATIVE 02/12/2019 1401  ° PROTEINUR 30 (A) 02/12/2019 1401  ° UROBILINOGEN 0.2 04/28/2016 1603  ° UROBILINOGEN 0.2 06/18/2013 0650  ° NITRITE NEGATIVE 02/12/2019 1401  ° LEUKOCYTESUR NEGATIVE 02/12/2019 1401  ° °Recent Results (from the past 240 hour(s))  °Culture, blood (routine x 2)     Status: None (Preliminary result)  ° Collection Time: 02/18/19  5:54 PM  ° Specimen: BLOOD LEFT ARM  °Result Value Ref Range Status  ° Specimen Description   Final  °  BLOOD LEFT ARM °Performed at [facility], [address] [place], [place] [address] °  ° Special Requests   Final  °  BOTTLES DRAWN AEROBIC AND ANAEROBIC Blood Culture adequate volume °Performed at [facility], [address] [place], [place] [address] °  ° Culture   Final  °  NO GROWTH 1 DAY °Performed at [facility], [address] [place], [place] [address] °  ° Report Status PENDING  Incomplete  °Culture, blood (routine x 2)     Status: None (Preliminary result)  ° Collection Time: 02/18/19  6:07 PM  ° Specimen: BLOOD LEFT ARM  °Result Value Ref Range Status  ° Specimen  Description   Final  °  BLOOD LEFT ARM °Performed at [facility], [address] [place], [place] [address] °  ° Special Requests   Final  °  BOTTLES DRAWN AEROBIC AND ANAEROBIC Blood Culture adequate volume °Performed at [facility], [address] [place], [place] [address] °  ° Culture   Final  °  NO GROWTH 1 DAY °Performed at [facility], [address] [place], [place] [address] °  ° Report Status PENDING  Incomplete  °  °Radiology Studies: °CT ABDOMEN PELVIS W CONTRAST ° °Result Date: 02/18/2019 °CLINICAL DATA:  Acute abdominal pain. Status post pre pyloric compared gastric ulcer perforation. EXAM: CT ABDOMEN AND PELVIS WITH CONTRAST TECHNIQUE: Multidetector CT imaging of the abdomen and pelvis was performed using the standard protocol following bolus administration of intravenous contrast. CONTRAST:  100mL OMNIPAQUE IOHEXOL 300 MG/ML  SOLN COMPARISON:  CT ABDOMEN 02/10/2019, UPPER GI 02/17/2019 FINDINGS: Lower chest: DENSE BIBASILAR ATELECTASIS WITH AIR BRONCHOGRAMS. SMALL BILATERAL PLEURAL EFFUSIONS. NO PULMONARY EDEMA. Hepatobiliary: Subcapsular fluid collection along the RIGHT hepatic lobe measures 5.5 by 1.3 cm and is smaller than comparison exam. Percutaneous drainage catheter enters the RIGHT abdominal wall adjacent to the RIGHT hepatic lobe. Catheter terminates adjacent the porta hepatis. No fluid collections associated with catheter. The gallbladder is collapsed around gallstones. No biliary duct dilatation. Pancreas: Pancreas is normal. No ductal dilatation. No pancreatic inflammation. Spleen: Normal spleen Adrenals/urinary tract: Adrenal glands are normal. Kidneys enhance symmetrically. There is contrast within mildly distended bladder. Small a gas bladder. Stomach/Bowel: NG tube extends the stomach. There is a large volume of high-density fluid within the ventral peritoneal space which appears   which appears associated with abnormal loops of small bowel. There is  mild pneumatosis with the loops of small bowel (a image 47/3). The walls of the bowel are not well identified. This high-density collection which is favored to be within the loops small bowel suggest flaccid bowel with limited peristalsis resulting in a matted collection of contrast. Contrast does flow into the distal small bowel bowel and colon. Vascular/Lymphatic: Abdominal aorta is normal caliber. No periportal or retroperitoneal adenopathy. No pelvic adenopathy. Reproductive: Endometrium is mildly expanded to 8 mm Other: There several pockets of intraperitoneal free fluid with enhancing rim. 1 pocket is posterior to the uterus measuring 5.4 x 3.5 cm. Similar smaller pocket in the peritoneal space ventrally on the LEFT measures 1.5 x 2.5 cm sagittal image 90/126. Musculoskeletal: No aggressive osseous lesion. IMPRESSION: 1. Stasis of oral contrast within the mid small bowel which floats in the central abdomen. The loops of bowel are difficult to discern from each and there is pneumatosis within several loops of bowel. Findings concerning for flaccid bowel/ischemic bowel. Recommend clinical correlation and surgical consultation. 2. The above contrast collection in the ventral abdomen is not favored to be related to gastric perforation as there is no high-density intraperitoneal fluid elsewhere and no significant intraperitoneal free air. 3. Several pockets of fluid within the peritoneal space with thin enhancing rims consistent with peritonitis. 4. Reduction in subcapsular fluid collection along the RIGHT hepatic lobe which may represent a biloma. 5. Dense bibasilar atelectasis. 1.  Findings conveyed toKELLY OSBORNE on 02/18/2019  at17:24. Electronically Signed   By: Suzy Bouchard M.D.   On: 02/18/2019 17:30    Scheduled Meds:  chlorhexidine gluconate (MEDLINE KIT)  15 mL Mouth Rinse BID   Chlorhexidine Gluconate Cloth  6 each Topical Daily   collagenase   Topical Daily   Gerhardt's butt cream    Topical TID   heparin injection (subcutaneous)  5,000 Units Subcutaneous Q8H   insulin aspart  0-20 Units Subcutaneous Q6H   ipratropium-albuterol  3 mL Nebulization BID   metoprolol tartrate  2.5 mg Intravenous Q6H   pantoprazole (PROTONIX) IV  40 mg Intravenous Q12H   sodium chloride flush  10-40 mL Intracatheter Q12H   sucralfate  1 g Oral TID WC & HS   Continuous Infusions:  sodium chloride 10 mL/hr at 02/18/19 1833   sodium chloride Stopped (02/13/19 2137)   anidulafungin Stopped (02/20/19 1150)   piperacillin-tazobactam (ZOSYN)  IV 12.5 mL/hr at 02/20/19 1153   TPN ADULT (ION) 90 mL/hr at 02/20/19 1153   TPN ADULT (ION)       LOS: 18 days   Time spent: 45mn SBlain Pais MD  02/20/2019, 12:38 PM

## 2019-02-20 NOTE — Progress Notes (Signed)
PHARMACY - TOTAL PARENTERAL NUTRITION CONSULT NOTE   Indication: Small bowel obstruction  Patient Measurements: Height: 5\' 2"  (157.5 cm) Weight: 131 lb 2.8 oz (59.5 kg) IBW/kg (Calculated) : 50.1 TPN AdjBW (KG): 57.9 Body mass index is 23.99 kg/m. Usual Weight: 55-60 kg Ideal Body Weight:  44.2 kg(Adjusted IBW for bilateral BKA)  Assessment:  66 yoF with poorly controlled DM s/p BL BKA, admitted 11/23 with AMS, n/v, abdominal pain. CT abdomen showed pneumoperitoneum and mesenteric ischemia. Underwent ex lap on 11/23 with finding of perforated prepyloric ulcer which was oversewn, along with gross contamination of the peritoneum. Hospital course complicated by DKA, AKI, acute thrombocytopenia, and C. glabrata candidemia. Patient remains with high NGT d/t bowel edema as demonstrated on UGI. TPN started on 11/30 but was stopped shortly thereafter to facilitate central line holiday for candidemia. Repeat blood cultures remain negative to date, and TPN now restarting with replacement of central line. Patient at risk for refeeding given prolonged lack of nutrition.  Glucose / Insulin: Hx DM; prev required D10 W infusion to maintain euglycemia  Current resistant Novolog scale q6h (12/5)   CBGs ( goal <150)  range 141-167, 15 units of Novolog  past 24 hrs, 20 units of regular insulin added to TPN  Electrolytes: WNL exc Bicarb is 21,corrected Ca 10.8 Renal: AKI on admit, SCr 0.93 trending down  LFTs / TGs: WNL (11/30), 126 (12/4), 116 ( 12/7)  Prealbumin / albumin: both low (11/30), 5.2 (12/4). 5.3 ( 12/7)  I/O: 1025 ml from JP drain,  800 ml UOP, 100 ml stool, 150 ml NGO MIVF: 1/2 NS at 10 ml/hr GI Imaging: 11/30 UGI: high NG output & bowel edema Surgeries / Procedures: 11/22 ex lap: oversew of duodenal ulcer with [name] patch  Central access: PICC replaced 12/3  TPN start date: resumed 12/3 (~ 2200)  Nutritional Goals (per RD recommendation on 12/7): Kcal:  2025-2330 kcal Protein:   115-130 grams Fluid:  >/= 2 L/day Goal TPN rate is 90 mL/hr (provides 129 g of protein and 2047 kcals per day) - GIR 3.3 mg/kg/min  Current Nutrition:  NPO  Plan: at 1800  Continue custom TPN at 90 mL/hr, the target rate   Electrolytes in TPN:  26mEq/L Na, 25 mEq/L K, 61mEq/L Ca, Magnesium 10 mEq/L , and 10 mmol/L of Phos ; continue Cl:Ac ratio 1:2  Standard MVI and trace elements inTPN on MWF only d/t national shortage  Continue SSI to resistant with q6h CBG checks and adjust as needed   Continue 20 units of Novolog to TPN  Continue 1/2 NS at [facility]    Monitor TPN labs on Mon/Thurs  Eudelia Bunch, Pharm.D [phone] 02/20/2019 7:57 AM

## 2019-02-20 NOTE — Progress Notes (Signed)
OT Cancellation Note  Patient Details Name: Gina [name] MRN: [id number] DOB: Jun 21, 1960   Cancelled Treatment:    Reason Eval/Treat Not Completed: Other (comment). Checked on pt:  She opened eyes and grimaced when I introduced myself. She did not want to work on UE strengthening nor mobility this am (shook head no, slightly).  She has not been seen since initial evaluation on 11/30. Will try another day.  Muskan Bolla 02/20/2019, 9:09 AM  Karsten Ro, OTR/L Acute Rehabilitation Services 02/20/2019

## 2019-02-20 NOTE — Progress Notes (Signed)
    [facility] for Infectious Disease   Reason for visit: Follow up on perforated gastric ulcer and C glabrata   Interval History:  Afebrile > 48 hours, WBC wnl.  Complaint of wanting the NGT out.  No associated new rash.     Physical Exam: Constitutional:  Vitals:   02/20/19 1100 02/20/19 1200  BP: (!) 150/66 (!) 159/68  Pulse: 86 76  Resp: 10 19  Temp:  (!) 97 F (36.1 C)  SpO2:     patient appears in NAD; chronically ill appearing Eyes: anicteric HENT: +NGT Respiratory: Normal respiratory effort; CTA B Cardiovascular: RRR  Review of Systems: Constitutional: negative for chills Respiratory: negative for cough or sputum  Lab Results  Component Value Date   WBC 9.7 02/20/2019   HGB 7.0 (L) 02/20/2019   HCT 23.0 (L) 02/20/2019   MCV 93.9 02/20/2019   PLT 356 02/20/2019    Lab Results  Component Value Date   CREATININE 0.93 02/20/2019   BUN 38 (H) 02/20/2019   NA 135 02/20/2019   K 4.6 02/20/2019   CL 106 02/20/2019   CO2 21 (L) 02/20/2019    Lab Results  Component Value Date   ALT 8 02/20/2019   AST 15 02/20/2019   ALKPHOS 72 02/20/2019     Microbiology: Recent Results (from the past 240 hour(s))  Culture, blood (routine x 2)     Status: None (Preliminary result)   Collection Time: 02/18/19  5:54 PM   Specimen: BLOOD LEFT ARM  Result Value Ref Range Status   Specimen Description   Final    BLOOD LEFT ARM Performed at [facility], [place] [address], [place], [place] [address]    Special Requests   Final    BOTTLES DRAWN AEROBIC AND ANAEROBIC Blood Culture adequate volume Performed at [facility] [address], [place], [place] [address]    Culture   Final    NO GROWTH 1 DAY Performed at [facility], [place] [address], [place], [place] [address]    Report Status PENDING  Incomplete  Culture, blood (routine x 2)     Status: None (Preliminary result)   Collection Time: 02/18/19  6:07 PM   Specimen:  BLOOD LEFT ARM  Result Value Ref Range Status   Specimen Description   Final    BLOOD LEFT ARM Performed at [facility] [address] [place], [place] [address]    Special Requests   Final    BOTTLES DRAWN AEROBIC AND ANAEROBIC Blood Culture adequate volume Performed at [facility] [address] [place], [place] [address]    Culture   Final    NO GROWTH 1 DAY Performed at [facility], [place] [address] [place], [place] [address]    Report Status PENDING  Incomplete    Impression/Plan:  1. Fungemia - positive blood cultures and on anidulafungin for C glabrata.  Agree with treatment through 12/14.    2. Intra abdominal infection - CT findings noted and on antibiotics including the anidulafungin as above and piptazo.  Surgery deferred at this time due to her illness and not doing well overall.  From an ID standpoint, will continue with antibiotics for now.   Poor prognosis overall.    3.  Biliary leak - significant drainage from the NGT.

## 2019-02-20 NOTE — Progress Notes (Signed)
SLP Cancellation Note  Patient Details Name: Phillip Mcduff MRN: [id number] DOB: 1960/03/29   Cancelled treatment:       Reason Eval/Treat Not Completed: Medical issues which prohibited therapy(pt remains unable to take po due to leak, will sign off, please reorder if desire)   Macario Golds 02/20/2019, 11:40 AM  Luanna Salk, [place] SLP Acute Rehab Services Pager [phone] Office [phone]

## 2019-02-20 NOTE — Progress Notes (Signed)
Patient ID: Gina [name], female   DOB: [birth year], 58 y.o.   MRN: [id number]    18 Days Post-Op  Subjective: Sleeping, awakens when we call her name, but she really does not engage [place] much.  Still with copious bilious output.  ROS: unable  Objective: Vital signs in last 24 hours: Temp:  [97.6 F (36.4 C)-99.2 F (37.3 C)] 97.7 F (36.5 C) (12/10 0800) Pulse Rate:  [82-95] 84 (12/10 0800) Resp:  [10-22] 11 (12/10 0800) BP: (134-171)/(43-76) 159/66 (12/10 0800) SpO2:  [99 %-100 %] 99 % (12/10 0847) Weight:  [59.5 kg] 59.5 kg (12/10 0520) Last BM Date: 02/19/19  Intake/Output from previous day: 12/09 0701 - 12/10 0700 In: 1579.3 [I.V.:1159.5; IV Piggyback:419.8] Out: 2075 [Urine:800; Emesis/NG output:150; Drains:1025; Stool:100] Intake/Output this shift: No intake/output data recorded.  PE: Abd: NGT in place with some bilious output, JP drain with thick bilious output.  250cc drained at the bedside by myself.  Midline wound is clean and fascia is intact.  Abdomen with some tenderness but no peritonitis.   Lab Results:  Recent Labs    02/18/19 0155 02/20/19 0254  WBC 6.7 9.7  HGB 7.8* 7.0*  HCT 25.3* 23.0*  PLT 322 356   BMET Recent Labs    02/19/19 0427 02/20/19 0254  NA 134* 135  K 4.9 4.6  CL 107 106  CO2 21* 21*  GLUCOSE 142* 146*  BUN 36* 38*  CREATININE 0.99 0.93  CALCIUM 8.2* 8.4*   PT/INR No results for input(s): LABPROT, INR in the last 72 hours. CMP     Component Value Date/Time   NA 135 02/20/2019 0254   NA 139 10/04/2017 1705   K 4.6 02/20/2019 0254   CL 106 02/20/2019 0254   CO2 21 (L) 02/20/2019 0254   GLUCOSE 146 (H) 02/20/2019 0254   BUN 38 (H) 02/20/2019 0254   BUN 11 10/04/2017 1705   CREATININE 0.93 02/20/2019 0254   CREATININE 0.68 04/28/2016 1640   CALCIUM 8.4 (L) 02/20/2019 0254   PROT 5.8 (L) 02/20/2019 0254   PROT 7.1 10/04/2017 1705   ALBUMIN 1.0 (L) 02/20/2019 0254   ALBUMIN 3.7 10/04/2017 1705   AST 15 02/20/2019  0254   ALT 8 02/20/2019 0254   ALKPHOS 72 02/20/2019 0254   BILITOT 0.4 02/20/2019 0254   BILITOT <0.2 10/04/2017 1705   GFRNONAA >60 02/20/2019 0254   GFRNONAA >89 04/28/2016 1640   GFRAA >60 02/20/2019 0254   GFRAA >89 04/28/2016 1640   Lipase     Component Value Date/Time   LIPASE 27 04/01/2016 1040       Studies/Results: CT ABDOMEN PELVIS W CONTRAST  Result Date: 02/18/2019 CLINICAL DATA:  Acute abdominal pain. Status post pre pyloric compared gastric ulcer perforation. EXAM: CT ABDOMEN AND PELVIS WITH CONTRAST TECHNIQUE: Multidetector CT imaging of the abdomen and pelvis was performed using the standard protocol following bolus administration of intravenous contrast. CONTRAST:  133mL OMNIPAQUE IOHEXOL 300 MG/ML  SOLN COMPARISON:  CT ABDOMEN 02/10/2019, UPPER GI 02/17/2019 FINDINGS: Lower chest: DENSE BIBASILAR ATELECTASIS WITH AIR BRONCHOGRAMS. SMALL BILATERAL PLEURAL EFFUSIONS. NO PULMONARY EDEMA. Hepatobiliary: Subcapsular fluid collection along the RIGHT hepatic lobe measures 5.5 by 1.3 cm and is smaller than comparison exam. Percutaneous drainage catheter enters the RIGHT abdominal wall adjacent to the RIGHT hepatic lobe. Catheter terminates adjacent the porta hepatis. No fluid collections associated with catheter. The gallbladder is collapsed around gallstones. No biliary duct dilatation. Pancreas: Pancreas is normal. No ductal dilatation. No pancreatic inflammation. Spleen:  Normal spleen Adrenals/urinary tract: Adrenal glands are normal. Kidneys enhance symmetrically. There is contrast within mildly distended bladder. Small a gas bladder. Stomach/Bowel: NG tube extends the stomach. There is a large volume of high-density fluid within the ventral peritoneal space which appears associated with abnormal loops of small bowel. There is mild pneumatosis with the loops of small bowel (a image 47/3). The walls of the bowel are not well identified. This high-density collection which is  favored to be within the loops small bowel suggest flaccid bowel with limited peristalsis resulting in a matted collection of contrast. Contrast does flow into the distal small bowel bowel and colon. Vascular/Lymphatic: Abdominal aorta is normal caliber. No periportal or retroperitoneal adenopathy. No pelvic adenopathy. Reproductive: Endometrium is mildly expanded to 8 mm Other: There several pockets of intraperitoneal free fluid with enhancing rim. 1 pocket is posterior to the uterus measuring 5.4 x 3.5 cm. Similar smaller pocket in the peritoneal space ventrally on the LEFT measures 1.5 x 2.5 cm sagittal image 90/126. Musculoskeletal: No aggressive osseous lesion. IMPRESSION: 1. Stasis of oral contrast within the mid small bowel which floats in the central abdomen. The loops of bowel are difficult to discern from each and there is pneumatosis within several loops of bowel. Findings concerning for flaccid bowel/ischemic bowel. Recommend clinical correlation and surgical consultation. 2. The above contrast collection in the ventral abdomen is not favored to be related to gastric perforation as there is no high-density intraperitoneal fluid elsewhere and no significant intraperitoneal free air. 3. Several pockets of fluid within the peritoneal space with thin enhancing rims consistent with peritonitis. 4. Reduction in subcapsular fluid collection along the RIGHT hepatic lobe which may represent a biloma. 5. Dense bibasilar atelectasis. 1.  Findings conveyed toKELLY Adely Facer on 02/18/2019  at17:24. Electronically Signed   By: Suzy Bouchard M.D.   On: 02/18/2019 17:30    Anti-infectives: Anti-infectives (From admission, onward)   Start     Dose/Rate Route Frequency Ordered Stop   02/18/19 1800  piperacillin-tazobactam (ZOSYN) IVPB 3.375 g     3.375 g 12.5 mL/hr over 240 Minutes Intravenous Every 8 hours 02/18/19 1652     02/05/19 1000  anidulafungin (ERAXIS) 100 mg in sodium chloride 0.9 % 100 mL IVPB      100 mg 78 mL/hr over 100 Minutes Intravenous Every 24 hours 02/04/19 0840     02/04/19 1000  anidulafungin (ERAXIS) 200 mg in sodium chloride 0.9 % 200 mL IVPB     200 mg 78 mL/hr over 200 Minutes Intravenous  Once 02/04/19 0840 02/04/19 1301   02/04/19 0200  fluconazole (DIFLUCAN) IVPB 200 mg  Status:  Discontinued     200 mg 100 mL/hr over 60 Minutes Intravenous Every 24 hours 02/02/19 2302 02/04/19 0840   02/03/19 1800  vancomycin (VANCOCIN) IVPB 750 mg/150 ml premix  Status:  Discontinued     750 mg 150 mL/hr over 60 Minutes Intravenous Every 24 hours 02/03/19 0241 02/03/19 0242   02/03/19 1800  vancomycin (VANCOCIN) IVPB 750 mg/150 ml premix  Status:  Discontinued     750 mg 150 mL/hr over 60 Minutes Intravenous Every 24 hours 02/03/19 0242 02/03/19 0858   02/03/19 0600  piperacillin-tazobactam (ZOSYN) IVPB 3.375 g     3.375 g 12.5 mL/hr over 240 Minutes Intravenous Every 8 hours 02/03/19 0243 02/17/19 0205   02/02/19 2315  fluconazole (DIFLUCAN) IVPB 400 mg     400 mg 100 mL/hr over 120 Minutes Intravenous STAT 02/02/19 2301 02/03/19 0218  02/02/19 1800  ceFEPIme (MAXIPIME) 2 g in sodium chloride 0.9 % 100 mL IVPB     2 g 200 mL/hr over 30 Minutes Intravenous  Once 02/02/19 1745 02/02/19 2030   02/02/19 1800  metroNIDAZOLE (FLAGYL) IVPB 500 mg     500 mg 100 mL/hr over 60 Minutes Intravenous  Once 02/02/19 1745 02/02/19 2237   02/02/19 1800  vancomycin (VANCOCIN) IVPB 1000 mg/200 mL premix  Status:  Discontinued     1,000 mg 200 mL/hr over 60 Minutes Intravenous  Once 02/02/19 1745 02/02/19 1747   02/02/19 1800  vancomycin (VANCOCIN) 1,250 mg in sodium chloride 0.9 % 250 mL IVPB     1,250 mg 166.7 mL/hr over 90 Minutes Intravenous  Once 02/02/19 1747 02/02/19 2237       Assessment/Plan VDRF- extubated 11/29 Poorly controlled DM2- A1c15.2 S/p bilateral BKA Ischemic changes to both LE- defer to medicine,but would benefit from eval from Dr. 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 No plans for sharp debridement at this time.  POD18,S/pex lap with oversew of duodenal ulcer,with Graham patch 11/23,Dr. Lucia Gaskins, for perforated prepyloric ulcer with gross contamination -now with apparent bile leak, although UGI negative but with limitations. CT scan also confirms leak but unable to tell where from.  Small bowel appears flaccid and not healthy, but given significant high density material any specific details are difficult to conclude. - BID wet to dry dressing changes to midline abdominal wound - BID PPI; added carafate -pulm toilet and IS -OOB to chair as much as possible -mobilize, PT/OT -cont TNA given leak, will be unable to take in enteral nutrition for the foreseeable future -will connect JP Drain to suction today to help with drainage of all the bile from her leak. Appreciate palliative care's assistance with this patient -defer further surgical intervention at this time as this will put her at higher risk for complications, etc.  Will cont with this management to control her leak and hopefully help her improve; however, she is not doing a whole lot and appears weak and frail.  Not sure she will do well overall.  ID - Eraxis 11/24>>Zosyn 11/23 >>12/6, 12/8 --> FEN -NPO/NGT VTE - NoSCDs,Bilateral BKa's - sq heparin  Foley -out Follow up -Dr. Lucia Gaskins   LOS: 18 days    Henreitta Cea , [facility] 02/20/2019, 10:16 AM Please see Amion for pager number during day hours 7:00am-4:30pm

## 2019-02-21 LAB — COMPREHENSIVE METABOLIC PANEL
ALT: 10 U/L (ref 0–44)
AST: 16 U/L (ref 15–41)
Albumin: 1.1 g/dL — ABNORMAL LOW (ref 3.5–5.0)
Alkaline Phosphatase: 104 U/L (ref 38–126)
Anion gap: 6 (ref 5–15)
BUN: 40 mg/dL — ABNORMAL HIGH (ref 6–20)
CO2: 23 mmol/L (ref 22–32)
Calcium: 8.3 mg/dL — ABNORMAL LOW (ref 8.9–10.3)
Chloride: 108 mmol/L (ref 98–111)
Creatinine, Ser: 0.84 mg/dL (ref 0.44–1.00)
GFR calc Af Amer: >60 mL/min (ref >60)
GFR calc non Af Amer: >60 mL/min (ref >60)
Glucose, Bld: 166 mg/dL — ABNORMAL HIGH (ref 70–99)
Potassium: 4.6 mmol/L (ref 3.5–5.1)
Sodium: 137 mmol/L (ref 135–145)
Total Bilirubin: 0.5 mg/dL (ref 0.3–1.2)
Total Protein: 6 g/dL — ABNORMAL LOW (ref 6.5–8.1)

## 2019-02-21 LAB — CBC
HCT: 22.7 % — ABNORMAL LOW (ref 36.0–46.0)
Hemoglobin: 7.1 g/dL — ABNORMAL LOW (ref 12.0–15.0)
MCH: 29.3 pg (ref 26.0–34.0)
MCHC: 31.3 g/dL (ref 30.0–36.0)
MCV: 93.8 fL (ref 80.0–100.0)
Platelets: 397 10*3/uL (ref 150–400)
RBC: 2.42 MIL/uL — ABNORMAL LOW (ref 3.87–5.11)
RDW: 15.9 % — ABNORMAL HIGH (ref 11.5–15.5)
WBC: 12.3 10*3/uL — ABNORMAL HIGH (ref 4.0–10.5)
nRBC: 0.5 % — ABNORMAL HIGH (ref 0.0–0.2)

## 2019-02-21 LAB — GLUCOSE, CAPILLARY
Glucose-Capillary: 133 mg/dL — ABNORMAL HIGH (ref 70–99)
Glucose-Capillary: 146 mg/dL — ABNORMAL HIGH (ref 70–99)
Glucose-Capillary: 168 mg/dL — ABNORMAL HIGH (ref 70–99)

## 2019-02-21 MED ORDER — ALBUTEROL SULFATE (2.5 MG/3ML) 0.083% IN NEBU: 2.5000 mg | INHALATION_SOLUTION | RESPIRATORY_TRACT | Status: DC | PRN

## 2019-02-21 MED ORDER — TRAVASOL 10 % IV SOLN
INTRAVENOUS | Status: AC
  Administered 2019-02-21: 17:00:00 via INTRAVENOUS
  Filled 2019-02-21: qty 1296

## 2019-02-21 NOTE — Progress Notes (Signed)
Nutrition Follow-up  DOCUMENTATION CODES:   Not applicable  INTERVENTION:  - continue TPN per Pharmacy.    NUTRITION DIAGNOSIS:   Inadequate oral intake related to altered GI function(pyloric channel ulcer perforation s/p ex lap) as evidenced by NPO status. -ongoing  GOAL:   Patient will meet greater than or equal to 90% of their needs -met with TPN regimen  MONITOR:   Diet advancement, Labs, Weight trends, Skin, Other (Comment)(TPN regimen)  ASSESSMENT:   58 year old female with past medical history of HTN, schizophrenia, poorly controlled T2DM with diabetic foot wounds s/p bilateral BKA, and recent left phalanx amputation, who presented to ED via EMS with AMS, nausea, vomiting and abdominal pain. Patient accompanied by daughter who reports patient has been complaining of "flu-like symptoms" for the past few days endorsing fever and chills. Patient lives alone, but has a caretaker that lives nearby and found patient on the floor of her home very altered. In ED patient found to be hypothermic and severely hypotensive, BG >1000, CT a/p showed findings concerning for mesenteric ischemia.  Significant Events: 11/22- admission 11/23- ex lap; over-sewing of duodenal ulcer with graham patch; pyloric channel ulcer perf; NGT placed 11/29- extubation; NGT replaced 11/30- plan for initiation of TPN 12/1- TPN held 12/3- double lumen PICC placed in R brachial; TPN re-started 12/5- NGT removed  12/7- NGT replaced   Patient noted to be a/o to self and place. She is able to communicate with facial expressions, nodding/shaking head, and mouthing words. She denies any abdominal pain/pressure/discomfort or nausea this AM.   NGT remains in place to LIS with ~100 ml brown liquid output. RN at bedside confirms that this is all that has been suctioned this shift. Flow sheet documentation indicates mild edema to BLE and moderate edema to BUE. Weight has been trending back down and is now consistent  with admission weight.  She has double lumen PICC and is receiving custom TPN at goal rate of 90 ml/hr. She is receiving MVI and trace minerals on MWF only due to current Producer, television/film/video.   SLP has been unable to work with patient concerning swallowing/ability to safely advance diet from a chewing and swallowing standpoint due to ongoing GI leak. SLP has now signed off.   Per notes: - ongoing GI leak s/p CT but unable to determine source of the leak - small bowel appears flaccid and unhealthy - suspect that patient will require ongoing TPN and no enteral nutrition for the foreseeable future - no plan for further surgical interventions d/t high risk for complications - guarded prognosis overall--Palliative Care following and last saw patient on 12/9    Labs reviewed; CBG: 133 mg/dl, BUN: 38 mg/dl, Ca: 8.4 mg/dl. Medications reviewed; sliding scale novolog, 40 mg IV protonix BID, 1 g carafate TID.   Diet Order:   Diet Order            Diet NPO time specified  Diet effective now              EDUCATION NEEDS:   No education needs have been identified at this time  Skin:  Skin Assessment: Skin Integrity Issues: Skin Integrity Issues:: Stage II, Unstageable, Incisions Stage II: L buttocks Unstageable: full thickness coccyx Incisions: abdomen (11/23)  Last BM:  12/10  Height:   Ht Readings from Last 1 Encounters:  02/07/19 5' 2"  (1.575 m)    Weight:   Wt Readings from Last 1 Encounters:  02/21/19 57.6 kg    Ideal Body  Weight:  44.2 kg(Adjusted IBW for bilateral BKA)  BMI:  Body mass index is 23.23 kg/m.  Estimated Nutritional Needs:   Kcal:  2025-2330 kcal  Protein:  115-130 grams  Fluid:  >/= 2 L/day     Jarome Matin, MS, RD, LDN, [facility] Inpatient Clinical Dietitian Pager # [phone] After hours/weekend pager # [phone]

## 2019-02-21 NOTE — Progress Notes (Signed)
PHARMACY - TOTAL PARENTERAL NUTRITION CONSULT NOTE   Indication: Small bowel obstruction  Patient Measurements: Height: 5\' 2"  (157.5 cm) Weight: 126 lb 15.8 oz (57.6 kg) IBW/kg (Calculated) : 50.1 TPN AdjBW (KG): 57.9 Body mass index is 23.23 kg/m. Usual Weight: 55-60 kg Ideal Body Weight:  44.2 kg(Adjusted IBW for bilateral BKA)  Assessment:  [name] with poorly controlled DM s/p BL BKA, admitted 11/23 with AMS, n/v, abdominal pain. CT abdomen showed pneumoperitoneum and mesenteric ischemia. Underwent ex lap on 11/23 with finding of perforated prepyloric ulcer which was oversewn, along with gross contamination of the peritoneum. Hospital course complicated by DKA, AKI, acute thrombocytopenia, and C. glabrata candidemia. Patient remains with high NGT d/t bowel edema as demonstrated on UGI. TPN started on 11/30 but was stopped shortly thereafter to facilitate central line holiday for candidemia. Repeat blood cultures remain negative to date, and TPN now restarting with replacement of central line. Patient at risk for refeeding given prolonged lack of nutrition.  Glucose / Insulin: Hx DM; prev required D10 W infusion to maintain euglycemia  Current resistant Novolog scale q6h (12/5)   CBGs ( goal <150)  range 133-168, 14 units of Novolog  past 24 hrs, 20 units of regular insulin added to TPN  Electrolytes: WNL, corrected Ca 10.8 Renal: AKI on admit, SCr 0.93 trending down  LFTs / TGs: WNL (11/30), 126 (12/4), 116 ( 12/7)  Prealbumin / albumin: both low (11/30), 5.2 (12/4). 5.3 ( 12/7)  I/O: 1200 ml from JP drain,  2150 ml UOP,  500 ml NGO MIVF: 1/2 NS at 10 ml/hr GI Imaging: 11/30 UGI: high NG output & bowel edema Surgeries / Procedures: 11/22 ex lap: oversew of duodenal ulcer with [name] patch  Central access: PICC replaced 12/3  TPN start date: resumed 12/3 (~ 2200)  Nutritional Goals (per RD recommendation on 12/11): Kcal:  2025-2330 kcal Protein:  115-130 grams Fluid:  >/= 2  L/day Goal TPN rate is 90 mL/hr (provides 129 g of protein and 2047 kcals per day) - GIR 3.3 mg/kg/min  Current Nutrition:  NPO  Plan: at 1800  Continue custom TPN at 90 mL/hr, the target rate   Electrolytes in TPN:  24mEq/L Na, 25 mEq/L K, 42mEq/L Ca, Magnesium 10 mEq/L , and 10 mmol/L of Phos ; continue Cl:Ac ratio 1:2  Standard MVI and trace elements inTPN on MWF only d/t national shortage  Continue SSI to resistant with q6h CBG checks and adjust as needed   Continue 20 units of Novolog to TPN  Continue 1/2 NS at [facility]    Monitor TPN labs on Mon/Thurs  BMET, phos, magnesium with AM labs   Royetta Asal, PharmD, BCPS 02/21/2019 9:40 AM

## 2019-02-21 NOTE — Progress Notes (Addendum)
19 Days Post-Op  Subjective: No complaints  Objective: Vital signs in last 24 hours: Temp:  [97 F (36.1 C)-100.8 F (38.2 C)] 100.8 F (38.2 C) (12/11 0352) Pulse Rate:  [76-96] 88 (12/11 0700) Resp:  [9-19] 16 (12/11 0700) BP: (126-186)/(45-72) 148/61 (12/11 0700) SpO2:  [96 %-100 %] 96 % (12/11 0400) Weight:  [57.6 kg] 57.6 kg (12/11 0500) Last BM Date: 02/20/19  Intake/Output from previous day: 12/10 0701 - 12/11 0700 In: 3669.8 [I.V.:3291.4; IV Piggyback:378.4] Out: 3600 [Urine:2150; Emesis/NG output:550; Drains:900] Intake/Output this shift: No intake/output data recorded.  General appearance: alert and cooperative Resp: clear to auscultation bilaterally Cardio: regular rate and rhythm GI: soft, minimal tenderness. wound clean. drain output bilious  Lab Results:  Recent Labs    02/20/19 0254 02/20/19 1858  WBC 9.7  --   HGB 7.0* 7.4*  HCT 23.0* 23.5*  PLT 356  --    BMET Recent Labs    02/19/19 0427 02/20/19 0254  NA 134* 135  K 4.9 4.6  CL 107 106  CO2 21* 21*  GLUCOSE 142* 146*  BUN 36* 38*  CREATININE 0.99 0.93  CALCIUM 8.2* 8.4*   PT/INR No results for input(s): LABPROT, INR in the last 72 hours. ABG No results for input(s): PHART, HCO3 in the last 72 hours.  Invalid input(s): PCO2, PO2  Studies/Results: No results found.  Anti-infectives: Anti-infectives (From admission, onward)   Start     Dose/Rate Route Frequency Ordered Stop   02/18/19 1800  piperacillin-tazobactam (ZOSYN) IVPB 3.375 g     3.375 g 12.5 mL/hr over 240 Minutes Intravenous Every 8 hours 02/18/19 1652     02/05/19 1000  anidulafungin (ERAXIS) 100 mg in sodium chloride 0.9 % 100 mL IVPB     100 mg 78 mL/hr over 100 Minutes Intravenous Every 24 hours 02/04/19 0840     02/04/19 1000  anidulafungin (ERAXIS) 200 mg in sodium chloride 0.9 % 200 mL IVPB     200 mg 78 mL/hr over 200 Minutes Intravenous  Once 02/04/19 0840 02/04/19 1301   02/04/19 0200  fluconazole  (DIFLUCAN) IVPB 200 mg  Status:  Discontinued     200 mg 100 mL/hr over 60 Minutes Intravenous Every 24 hours 02/02/19 2302 02/04/19 0840   02/03/19 1800  vancomycin (VANCOCIN) IVPB 750 mg/150 ml premix  Status:  Discontinued     750 mg 150 mL/hr over 60 Minutes Intravenous Every 24 hours 02/03/19 0241 02/03/19 0242   02/03/19 1800  vancomycin (VANCOCIN) IVPB 750 mg/150 ml premix  Status:  Discontinued     750 mg 150 mL/hr over 60 Minutes Intravenous Every 24 hours 02/03/19 0242 02/03/19 0858   02/03/19 0600  piperacillin-tazobactam (ZOSYN) IVPB 3.375 g     3.375 g 12.5 mL/hr over 240 Minutes Intravenous Every 8 hours 02/03/19 0243 02/17/19 0205   02/02/19 2315  fluconazole (DIFLUCAN) IVPB 400 mg     400 mg 100 mL/hr over 120 Minutes Intravenous STAT 02/02/19 2301 02/03/19 0218   02/02/19 1800  ceFEPIme (MAXIPIME) 2 g in sodium chloride 0.9 % 100 mL IVPB     2 g 200 mL/hr over 30 Minutes Intravenous  Once 02/02/19 1745 02/02/19 2030   02/02/19 1800  metroNIDAZOLE (FLAGYL) IVPB 500 mg     500 mg 100 mL/hr over 60 Minutes Intravenous  Once 02/02/19 1745 02/02/19 2237   02/02/19 1800  vancomycin (VANCOCIN) IVPB 1000 mg/200 mL premix  Status:  Discontinued     1,000 mg 200 mL/hr  over 60 Minutes Intravenous  Once 02/02/19 1745 02/02/19 1747   02/02/19 1800  vancomycin (VANCOCIN) 1,250 mg in sodium chloride 0.9 % 250 mL IVPB     1,250 mg 166.7 mL/hr over 90 Minutes Intravenous  Once 02/02/19 1747 02/02/19 2237      Assessment/Plan: s/p Procedure(s): EXPLORATORY LAPAROTOMY WITH OVERSEW OF DUODENAL ULCER Continue bowel rest and tpn for nutrition support  VDRF- extubated 11/29 Poorly controlled DM2- A1c15.2 S/p bilateral BKA Ischemic changes to both LE- defer to medicine,but would benefit from eval from Dr. 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 No plans for sharp debridement at this time.  POD19,S/pex lap with oversew of duodenal ulcer,with Graham patch 11/23,Dr. Lucia Gaskins, for perforated prepyloric ulcer with gross contamination -now with apparent bile leak, although UGI negative but with limitations.CT scan also confirms leak but unable to tell where from. Small bowel appears flaccid and not healthy, but given significant high density material any specific details are difficult to conclude. - BID wet to dry dressing changes to midline abdominal wound - BID PPI; added carafate -pulm toilet and IS -OOB to chair as much as possible -mobilize, PT/OT -cont TNAgiven leak, will be unable to take in enteral nutrition for the foreseeable future -will connect JP Drain to suction today to help with drainage of all the bile from her leak. Appreciate palliative care's assistance with this patient -defer further surgical intervention at this time as this will put her at higher risk for complications, etc.  Will cont with this management to control her leak and hopefully help her improve; however, she is not doing a whole lot and appears weak and frail.  Not sure she will do well overall.  ID - Eraxis 11/24>>Zosyn 11/23 >>12/6, 12/8 --> FEN -NPO/NGT VTE - NoSCDs,Bilateral BKa's - sq heparin  Foley -out Follow up -Dr. Lucia Gaskins  LOS: 19 days    Gina [name] 02/21/2019

## 2019-02-21 NOTE — Progress Notes (Signed)
Physical Therapy Treatment Patient Details Name: Gina [name] MRN: [id number] DOB: 28-Jun-1960 Today's Date: 02/21/2019    History of Present Illness 58 yo female admitted with septic shock, perforated ulcer. S/P ex lap, oversewing of duodenal ulcer 11/23. Extubated 11/29. 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  Pt sitting EOB x 10 min with assist for wt shifts and to correct for posterior and right drift.   Follow Up Recommendations  SNF     Equipment Recommendations  None recommended by PT    Recommendations for Other Services       Precautions / Restrictions Precautions Precautions: Fall Precaution Comments: bil BKA, NG tube, multiple lines/leads, NPO, R jp drain Restrictions Weight Bearing Restrictions: No    Mobility  Bed Mobility Overal bed mobility: Needs Assistance       Supine to sit: HOB elevated;+2 for physical assistance;+2 for safety/equipment;Max assist Sit to supine: +2 for physical assistance;Total assist   General bed mobility comments: Use of pad under pt to move to from EOB  Transfers                 General transfer comment: NT 2* pts limitations with sitting, balance and sacral wound  Ambulation/Gait                 Stairs             Wheelchair Mobility    Modified Rankin (Stroke Patients Only)       Balance Overall balance assessment: Needs assistance Sitting-balance support: Bilateral upper extremity supported Sitting balance-Leahy Scale: Poor Sitting balance - Comments: Pt sitting EOB x 10 min with mod to min assist and use of bil UEs Postural control: Right lateral lean;Posterior lean                                  Cognition Arousal/Alertness:  Awake/alert Behavior During Therapy: WFL for tasks assessed/performed Overall Cognitive Status: No family/caregiver present to determine baseline cognitive functioning Area of Impairment: Problem solving                             Problem Solving: Slow processing;Requires verbal cues;Requires tactile cues General Comments: Pt following commands with repetition but focussed on brushing her teeth - RN in room to address      Exercises      General Comments        Pertinent Vitals/Pain Pain Assessment: Faces Faces Pain Scale: Hurts even more Pain Location: back and "between my legs" Pain Descriptors / Indicators: Grimacing Pain Intervention(s): Limited activity within patient's tolerance;Monitored during session;Patient requesting pain meds-RN notified    Home Living                      Prior Function            PT Goals (current goals can now be found in the care plan section) Acute Rehab PT Goals Patient Stated Goal: none stated PT Goal Formulation: With patient Time For Goal Achievement: 02/24/19 Potential to Achieve Goals: Fair Progress towards PT goals: Progressing toward goals    Frequency    Min 2X/week  PT Plan Current plan remains appropriate    Co-evaluation              AM-PAC PT "6 Clicks" Mobility   Outcome Measure  Help needed turning from your back to your side while in a flat bed without using bedrails?: A Lot Help needed moving from lying on your back to sitting on the side of a flat bed without using bedrails?: A Lot Help needed moving to and from a bed to a chair (including a wheelchair)?: Total Help needed standing up from a chair using your arms (e.g., wheelchair or bedside chair)?: Total Help needed to walk in hospital room?: Total Help needed climbing 3-5 steps with a railing? : Total 6 Click Score: 8    End of Session   Activity Tolerance: Patient limited by fatigue Patient left: with call bell/phone  within reach;in bed;with bed alarm set Nurse Communication: Mobility status PT Visit Diagnosis: Muscle weakness (generalized) (M62.81);Other abnormalities of gait and mobility (R26.89)     Time: [id number] PT Time Calculation (min) (ACUTE ONLY): 23 min  Charges:  $Therapeutic Activity: 23-37 mins                     Scott Pager [phone] Office [phone]    Aarish Rockers 02/21/2019, 11:34 AM

## 2019-02-21 NOTE — Progress Notes (Signed)
    [facility]   Reason for visit: Follow up on perforated gastric ulcer and C glabrata   Interval History:  Tmax 100.8.  WBC 12.3.  No significant events.  No associated diarrhea.    Physical Exam: Constitutional:  Vitals:   02/21/19 1200 02/21/19 1300  BP: (!) 170/62 (!) 156/62  Pulse: 82 84  Resp: 18 15  Temp: 98.3 F (36.8 C)   SpO2:     patient appears in NAD, sleeping; chronically ill appearing Eyes: anicteric HENT: +NGT Respiratory: Normal respiratory effort; CTA B Cardiovascular: RRR  Review of Systems: Constitutional: negative for chills  Lab Results  Component Value Date   WBC 12.3 (H) 02/21/2019   HGB 7.1 (L) 02/21/2019   HCT 22.7 (L) 02/21/2019   MCV 93.8 02/21/2019   PLT 397 02/21/2019    Lab Results  Component Value Date   CREATININE 0.84 02/21/2019   BUN 40 (H) 02/21/2019   NA 137 02/21/2019   K 4.6 02/21/2019   CL 108 02/21/2019   CO2 23 02/21/2019    Lab Results  Component Value Date   ALT 10 02/21/2019   AST 16 02/21/2019   ALKPHOS 104 02/21/2019     Microbiology: Recent Results (from the past 240 hour(s))  Culture, blood (routine x 2)     Status: None (Preliminary result)   Collection Time: 02/18/19  5:54 PM   Specimen: BLOOD LEFT ARM  Result Value Ref Range Status   Specimen Description   Final    BLOOD LEFT ARM Performed at [facility], [place] [address], [place], [place] [address]    Special Requests   Final    BOTTLES DRAWN AEROBIC AND ANAEROBIC Blood Culture adequate volume Performed at [facility] [address], [place], [place] [address]    Culture   Final    NO GROWTH 2 DAYS Performed at [facility] [address], [place], [place] [address]    Report Status PENDING  Incomplete  Culture, blood (routine x 2)     Status: None (Preliminary result)   Collection Time: 02/18/19  6:07 PM   Specimen: BLOOD LEFT ARM  Result Value Ref Range Status   Specimen Description   Final    BLOOD LEFT ARM Performed at [facility] [address] [place], [place] [address]    Special Requests   Final    BOTTLES DRAWN AEROBIC AND ANAEROBIC Blood Culture adequate volume Performed at [facility] [address] [place], [place] [address]    Culture   Final    NO GROWTH 2 DAYS Performed at [facility] [address], [place],  [address]    Report Status PENDING  Incomplete    Impression/Plan:  1. Fungemia - C glabrata.  On anidulafungin and will continue through 12/14.    2. Intra abdominal infection -on piperacillin/tazobactam and no changes.  Not a surgical candidate to repair.  Will continue with antibiotics.    Dr. Baxter Flattery back on Monday.  Dr. Tommy Medal available over the weekend if needed.

## 2019-02-21 NOTE — Progress Notes (Signed)
PROGRESS NOTE   Gina [name]   [id number]  DOB: 1960-11-19  DOA: 02/02/2019 PCP: [name], MD   Brief Narrative: PCCM transferred to [facility] 11/30 -The patient is a 58 year old female with severe protein calorie malnutrition, poorly controlled type 2 diabetes mellitus with bilateral foot ulcers, status post bilateral below-knee knee amputation, recent left phalanx amputation, schizophrenia, hypertension, severe protein calorie malnutrition was admitted to the ICU with septic shock , she was found to have a perforated gastric ulcer with pneumoperitoneum, also had evidence of DKA on admission -she was intubated, admitted to the ICU, treated with broad-spectrum antibiotics, pressors, aggressive fluid resuscitation, ultimately underwent ex lap on 11/23 which noted a perforated prepyloric ulcer which was oversewn and 3.1 L of contaminated peritoneal fluid was drained, she was subsequently extubated on 11/29. -She has had a extensive complicated hospital course notable for DKA, acute kidney injury, acute blood loss anemia, acute thrombocytopenia, intra-abdominal sepsis, bacteremia, fungemia. -General surgery continues to follow closely, NG tube was removed on 11/25, currently on TPN, during this hospital stay was also noted to have severe ischemic changes at her BKA site, vascular surgery as well as Dr. Sharol Given were consulted, they recommended consideration of above-knee amputation when patient is more stable -Palliative medicine team was consulted on 12/7  Subjective: Patient seen and examined this morning. She continues to have bile leak with JP drainto suction, full of bile. Surgery with plan to continue medical management, no indication for surgery. Patient remains frail and weak. Remains on TPN.   Assessment & Plan:  Severe septic shock due to ruptured gastric ulcer with acute peritonitis, fungemia and UTI - ID consulted on 02/04/19 and following.  - Blood cultures from 11/22> Candida  Glabrata- repeat blood culture 11/24 NGTD - Urine culture 11/22 > K pneumoniae - Peritoneal fluid 11/23> Candida albicans - Opthalmology consulted - no evidence of fungal endophthalmitis  - currently receiving Andulafungin and Zosyn - ID has recommended to remove central line to give line holiday and right IJ was removed on 12/1 - placed PICC 12/3- Gen surgery resumed TPN -02/15/2019: Sepsis physiology has resolved significantly.  Infectious disease team is managing fungemia and intra-abdominal infection, antifungal and antibiotics per their recommendations.  -General surgery following, no surgical interventions at this time.   Ischemic changes at bilateral BKA site -This is secondary to severe septic shock, required pressors.  -Seen by vascular surgery Dr. Scot Dock and orthopedics Dr. Sharol Given, they feel she will eventually require an above-knee amputation when she is more stable, patient declines and defers it at this time.   Acute kidney injury - improving - baseline Cr is < 1.0 - Cr rose to 2.66 on 11/26, likely from ATN,improved, back to baseline of <1 now.   ICU delirium -Likely secondary to all above infections, mental status improving slowly -Remains on as needed Haldol however has not required this in several days    DKA (diabetic ketoacidoses), severely uncontrolled with hyperglycemia  - resolved - A1c on 11/23>  15.2 -Continue Lantus and sliding scale, CBGs are stable now  Hypokalemia - replete as needed, recheck level in am.   Severe protein calorie malnutrition with hypoalbuminemia 02/15/2019: Patient still on TPN.  NG tube has been discontinued.  Currently on ice chips. -SLP is following and will assist in diet advancement.   Anemia due to acute blood loss, critical illness - Has required 2 units of PRBC this admission -Hgb staying in the 7-8 range. No active bleeding.  -Will repeat labs in am,  transfuse if Hgb <7.   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  Dr. Wynetta Emery spoke to Dr. Caralyn Guile 12/7 who said that RN can remove sutures and place picture in chart and he will review.   -Sutures were removed on 12/9 by RN with no complications.   S/p Bl BKA  Ethics: This is a seriously ill patient with the extremely sick and complicated hospital course with fungemia, intra-abdominal infection following perforation, severe protein calorie malnutrition, uncontrolled diabetes, ischemic changes in bilateral BKA requiring bilateral AKA, remains at risk of additional complications and decline -Palliative medicine team was consulted on 12/7 for goals of care, had meeting with family on 12/9 but no updates from them since then. Per family, they wanted time to consider further [place] discussion.     Time spent in minutes: 35 min  DVT prophylaxis: heparin Code Status: Full code Family Communication: None at bedside Disposition Plan: Stay in stepdown  Consultants:   General surgery  ID  Palliative Care  Procedures:   Intubation/ Extubation  Ex lap Antimicrobials:  Anti-infectives (From admission, onward)   Start     Dose/Rate Route Frequency Ordered Stop   02/18/19 1800  piperacillin-tazobactam (ZOSYN) IVPB 3.375 g     3.375 g 12.5 mL/hr over 240 Minutes Intravenous Every 8 hours 02/18/19 1652     02/05/19 1000  anidulafungin (ERAXIS) 100 mg in sodium chloride 0.9 % 100 mL IVPB     100 mg 78 mL/hr over 100 Minutes Intravenous Every 24 hours 02/04/19 0840     02/04/19 1000  anidulafungin (ERAXIS) 200 mg in sodium chloride 0.9 % 200 mL IVPB     200 mg 78 mL/hr over 200 Minutes Intravenous  Once 02/04/19 0840 02/04/19 1301   02/04/19 0200  fluconazole (DIFLUCAN) IVPB 200 mg  Status:  Discontinued     200 mg 100 mL/hr over 60  Minutes Intravenous Every 24 hours 02/02/19 2302 02/04/19 0840   02/03/19 1800  vancomycin (VANCOCIN) IVPB 750 mg/150 ml premix  Status:  Discontinued     750 mg 150 mL/hr over 60 Minutes Intravenous Every 24 hours 02/03/19 0241 02/03/19 0242   02/03/19 1800  vancomycin (VANCOCIN) IVPB 750 mg/150 ml premix  Status:  Discontinued     750 mg 150 mL/hr over 60 Minutes Intravenous Every 24 hours 02/03/19 0242 02/03/19 0858   02/03/19 0600  piperacillin-tazobactam (ZOSYN) IVPB 3.375 g     3.375 g 12.5 mL/hr over 240 Minutes Intravenous Every 8 hours 02/03/19 0243 02/17/19 0205   02/02/19 2315  fluconazole (DIFLUCAN) IVPB 400 mg     400 mg 100 mL/hr over 120 Minutes Intravenous STAT 02/02/19 2301 02/03/19 0218   02/02/19 1800  ceFEPIme (MAXIPIME) 2 g in sodium chloride 0.9 % 100 mL IVPB     2 g 200 mL/hr over 30 Minutes Intravenous  Once 02/02/19 1745 02/02/19 2030   02/02/19 1800  metroNIDAZOLE (FLAGYL) IVPB 500 mg     500 mg 100 mL/hr over 60 Minutes Intravenous  Once 02/02/19 1745 02/02/19 2237   02/02/19 1800  vancomycin (VANCOCIN) IVPB 1000 mg/200 mL premix  Status:  Discontinued     1,000 mg 200 mL/hr over 60 Minutes Intravenous  Once 02/02/19 1745 02/02/19  1747   02/02/19 1800  vancomycin (VANCOCIN) 1,250 mg in sodium chloride 0.9 % 250 mL IVPB     1,250 mg 166.7 mL/hr over 90 Minutes Intravenous  Once 02/02/19 1747 02/02/19 2237      Objective: Vitals:   02/21/19 1200 02/21/19 1300 02/21/19 1400 02/21/19 1500  BP: (!) 170/62 (!) 156/62 (!) 160/62 (!) 128/46  Pulse: 82 84 87 86  Resp: _0 Temp: 98.3 F (36.8 C)     TempSrc: Oral     SpO2:      Weight:      Height:        Intake/Output Summary (Last 24 hours) at 02/21/2019 1534 Last data filed at 02/21/2019 1300 Gross per 24 hour  Intake 2204.47 ml  Output 2600 ml  Net -395.53 ml   Filed Weights   02/19/19 0500 02/20/19 0520 02/21/19 0500  Weight: 61.4 kg 59.5 kg 57.6 kg    Examination: Gen: Extremely  cachectic chronically ill-appearing female laying in bed awake alert, answers "yes" or "no" to questions, no distress HEENT: PERRLA, Neck supple, no JVD Lungs: No respiratory distress, no wheezing.  CVS: RRR Abd: Soft, mild diffuse tenderness, mildly distended. JP drain in place with bile.  Extremities: Bilateral BKA with ischemic changes skin: Ischemic changes, discolorations noted in both lower legs near BKA site Flexi-Seal noted with stool content.  Data Reviewed: I have personally reviewed following labs and imaging studies  CBC: Recent Labs  Lab 02/16/19 0254 02/17/19 0522 02/18/19 0155 02/20/19 0254 02/20/19 1858 02/21/19 1110  WBC 5.2 7.2 6.7 9.7  --  12.3*  NEUTROABS 3.5 4.5 4.5  --   --   --   HGB 6.1* 7.6* 7.8* 7.0* 7.4* 7.1*  HCT 19.9* 24.8* 25.3* 23.0* 23.5* 22.7*  MCV 94.3 94.3 95.1 93.9  --  93.8  PLT 338 341 322 356  --  322   Basic Metabolic Panel: Recent Labs  Lab 02/16/19 0254 02/17/19 0522 02/18/19 0155 02/19/19 0427 02/20/19 0254 02/21/19 1110  NA 138  138 136 136 134* 135 137  K 4.2  4.2 4.6 4.8 4.9 4.6 4.6  CL 109  109 106 109 107 106 108  CO2 21*  21* 19* 19* 21* 21* 23  GLUCOSE 253*  253* 196* 118* 142* 146* 166*  BUN 30*  30* 32* 33* 36* 38* 40*  CREATININE 1.23*  1.34* 1.24* 1.14* 0.99 0.93 0.84  CALCIUM 7.9*  7.9* 8.3* 8.3* 8.2* 8.4* 8.3*  MG 1.9 1.8 1.9 2.1 2.2  --   PHOS 2.6  2.6 2.6 3.2 3.9 3.8  --    GFR: Estimated Creatinine Clearance: 58.4 mL/min (by C-G formula based on SCr of 0.84 mg/dL). Liver Function Tests: Recent Labs  Lab 02/16/19 0254 02/17/19 0522 02/20/19 0254 02/21/19 1110  AST  --  9* 15 16  ALT  --  _1 ALKPHOS  --  54 72 104  BILITOT  --  0.4 0.4 0.5  PROT  --  5.8* 5.8* 6.0*  ALBUMIN 1.2* 1.2* 1.0* 1.1*   No results for input(s): LIPASE, AMYLASE in the last 168 hours. No results for input(s): AMMONIA in the last 168 hours. Coagulation Profile: No results for input(s): INR, PROTIME in the  last 168 hours. Cardiac Enzymes: No results for input(s): CKTOTAL, CKMB, CKMBINDEX, TROPONINI in the last 168 hours. BNP (last 3 results) No results for input(s): PROBNP in the last 8760 hours. HbA1C: No results for input(s): HGBA1C  in the last 72 hours. CBG: Recent Labs  Lab 02/20/19 1137 02/20/19 1746 02/20/19 2353 02/21/19 0520 02/21/19 1214  GLUCAP 168* 161* 146* 133* 168*   Lipid Profile: No results for input(s): CHOL, HDL, LDLCALC, TRIG, CHOLHDL, LDLDIRECT in the last 72 hours. Thyroid Function Tests: No results for input(s): TSH, T4TOTAL, FREET4, T3FREE, THYROIDAB in the last 72 hours. Anemia Panel: No results for input(s): VITAMINB12, FOLATE, FERRITIN, TIBC, IRON, RETICCTPCT in the last 72 hours. Urine analysis:    Component Value Date/Time   COLORURINE YELLOW 02/12/2019 1401   APPEARANCEUR CLOUDY (A) 02/12/2019 1401   LABSPEC 1.010 02/12/2019 1401   PHURINE 5.0 02/12/2019 1401   GLUCOSEU >=500 (A) 02/12/2019 1401   HGBUR MODERATE (A) 02/12/2019 1401   BILIRUBINUR NEGATIVE 02/12/2019 1401   BILIRUBINUR N 04/28/2016 1603   KETONESUR NEGATIVE 02/12/2019 1401   PROTEINUR 30 (A) 02/12/2019 1401   UROBILINOGEN 0.2 04/28/2016 1603   UROBILINOGEN 0.2 06/18/2013 0650   NITRITE NEGATIVE 02/12/2019 1401   LEUKOCYTESUR NEGATIVE 02/12/2019 1401   Recent Results (from the past 240 hour(s))  Culture, blood (routine x 2)     Status: None (Preliminary result)   Collection Time: 02/18/19  5:54 PM   Specimen: BLOOD LEFT ARM  Result Value Ref Range Status   Specimen Description   Final    BLOOD LEFT ARM Performed at [facility], [place] [address] [place], [place] [address]    Special Requests   Final    BOTTLES DRAWN AEROBIC AND ANAEROBIC Blood Culture adequate volume Performed at [facility] [address], [place], [place] [address]    Culture   Final    NO GROWTH 2 DAYS Performed at [facility] [address],  [place], [place] [address]    Report Status PENDING  Incomplete  Culture, blood (routine x 2)     Status: None (Preliminary result)   Collection Time: 02/18/19  6:07 PM   Specimen: BLOOD LEFT ARM  Result Value Ref Range Status   Specimen Description   Final    BLOOD LEFT ARM Performed at [facility] [address] [place], [place] [address]    Special Requests   Final    BOTTLES DRAWN AEROBIC AND ANAEROBIC Blood Culture adequate volume Performed at [facility] [address] [place], [place] [address]    Culture   Final    NO GROWTH 2 DAYS Performed at [facility] [address] [place], [place] [address]    Report Status PENDING  Incomplete    Radiology Studies: No results found.  Scheduled Meds: . chlorhexidine gluconate (MEDLINE KIT)  15 mL Mouth Rinse BID  . Chlorhexidine Gluconate Cloth  6 each Topical Daily  . collagenase   Topical Daily  . Gerhardt's butt cream   Topical TID  . heparin injection (subcutaneous)  5,000 Units Subcutaneous Q8H  . insulin aspart  0-20 Units Subcutaneous Q6H  . metoprolol tartrate  2.5 mg Intravenous Q6H  . pantoprazole (PROTONIX) IV  40 mg Intravenous Q12H  . sodium chloride flush  10-40 mL Intracatheter Q12H  . sucralfate  1 g Oral TID WC & HS   Continuous Infusions: . sodium chloride 10 mL/hr at 02/18/19 1833  . sodium chloride Stopped (02/13/19 2137)  . anidulafungin Stopped (02/21/19 1245)  . piperacillin-tazobactam (ZOSYN)  IV Stopped (02/21/19 1500)  . TPN ADULT (ION) 90 mL/hr at 02/21/19 1300  . TPN ADULT (ION)  LOS: 19 days   Time spent: 59mn SBlain Pais MD  02/21/2019, 3:34 PM

## 2019-02-22 LAB — COMPREHENSIVE METABOLIC PANEL
ALT: 12 U/L (ref 0–44)
AST: 18 U/L (ref 15–41)
Albumin: 1.1 g/dL — ABNORMAL LOW (ref 3.5–5.0)
Alkaline Phosphatase: 142 U/L — ABNORMAL HIGH (ref 38–126)
Anion gap: 9 (ref 5–15)
BUN: 36 mg/dL — ABNORMAL HIGH (ref 6–20)
CO2: 20 mmol/L — ABNORMAL LOW (ref 22–32)
Calcium: 8.7 mg/dL — ABNORMAL LOW (ref 8.9–10.3)
Chloride: 108 mmol/L (ref 98–111)
Creatinine, Ser: 0.84 mg/dL (ref 0.44–1.00)
GFR calc Af Amer: >60 mL/min (ref >60)
GFR calc non Af Amer: >60 mL/min (ref >60)
Glucose, Bld: 162 mg/dL — ABNORMAL HIGH (ref 70–99)
Potassium: 4.3 mmol/L (ref 3.5–5.1)
Sodium: 137 mmol/L (ref 135–145)
Total Bilirubin: 0.3 mg/dL (ref 0.3–1.2)
Total Protein: 6 g/dL — ABNORMAL LOW (ref 6.5–8.1)

## 2019-02-22 LAB — GLUCOSE, CAPILLARY
Glucose-Capillary: 133 mg/dL — ABNORMAL HIGH (ref 70–99)
Glucose-Capillary: 169 mg/dL — ABNORMAL HIGH (ref 70–99)
Glucose-Capillary: 166 mg/dL — ABNORMAL HIGH (ref 70–99)
Glucose-Capillary: 190 mg/dL — ABNORMAL HIGH (ref 70–99)
Glucose-Capillary: 213 mg/dL — ABNORMAL HIGH (ref 70–99)

## 2019-02-22 LAB — CBC
HCT: 26.5 % — ABNORMAL LOW (ref 36.0–46.0)
Hemoglobin: 8.1 g/dL — ABNORMAL LOW (ref 12.0–15.0)
MCH: 28.8 pg (ref 26.0–34.0)
MCHC: 30.6 g/dL (ref 30.0–36.0)
MCV: 94.3 fL (ref 80.0–100.0)
Platelets: 376 10*3/uL (ref 150–400)
RBC: 2.81 MIL/uL — ABNORMAL LOW (ref 3.87–5.11)
RDW: 15.9 % — ABNORMAL HIGH (ref 11.5–15.5)
WBC: 11.6 10*3/uL — ABNORMAL HIGH (ref 4.0–10.5)
nRBC: 0.3 % — ABNORMAL HIGH (ref 0.0–0.2)

## 2019-02-22 LAB — MAGNESIUM: Magnesium: 2 mg/dL (ref 1.7–2.4)

## 2019-02-22 LAB — PHOSPHORUS: Phosphorus: 3.2 mg/dL (ref 2.5–4.6)

## 2019-02-22 MED ORDER — HYDRALAZINE HCL 20 MG/ML IJ SOLN
5.0000 mg | Freq: Four times a day (QID) | INTRAMUSCULAR | Status: DC | PRN
  Administered 2019-02-22 – 2019-02-26 (×3): 5 mg via INTRAVENOUS
  Filled 2019-02-22 (×3): qty 1

## 2019-02-22 MED ORDER — TRAVASOL 10 % IV SOLN
INTRAVENOUS | Status: AC
  Administered 2019-02-22: 18:00:00 via INTRAVENOUS
  Filled 2019-02-22: qty 1296

## 2019-02-22 NOTE — Progress Notes (Signed)
PROGRESS NOTE   Gina [name]   GUR:[id number]  DOB: June 29, 1960  DOA: 02/02/2019 PCP: [name], MD   Brief Narrative: PCCM transferred to [facility] 11/30 -The patient is a 58 year old female with severe protein calorie malnutrition, poorly controlled type 2 diabetes mellitus with bilateral foot ulcers, status post bilateral below-knee knee amputation, recent left phalanx amputation, schizophrenia, hypertension, severe protein calorie malnutrition was admitted to the ICU with septic shock , she was found to have a perforated gastric ulcer with pneumoperitoneum, also had evidence of DKA on admission -she was intubated, admitted to the ICU, treated with broad-spectrum antibiotics, pressors, aggressive fluid resuscitation, ultimately underwent ex lap on 11/23 which noted a perforated prepyloric ulcer which was oversewn and 3.1 L of contaminated peritoneal fluid was drained, she was subsequently extubated on 11/29. -She has had a extensive complicated hospital course notable for DKA, acute kidney injury, acute blood loss anemia, acute thrombocytopenia, intra-abdominal sepsis, bacteremia, fungemia. -General surgery continues to follow closely, NG tube was removed on 11/25, currently on TPN, during this hospital stay was also noted to have severe ischemic changes at her BKA site, vascular surgery as well as Dr. Sharol Given were consulted, they recommended consideration of above-knee amputation when patient is more stable -Palliative medicine team was consulted on 12/7  Subjective: Patient seen and examined this morning. She continues to have bile leak with JP drain to suction, full of bile. NG tube also with bile. Surgery with plan to continue medical management, no indication for surgery. Patient remains frail and weak. Remains on TPN. She denies abdominal pain.   Assessment & Plan:  Severe septic shock due to ruptured gastric ulcer with acute peritonitis, fungemia and UTI - ID consulted on 02/04/19 and  following.  - Blood cultures from 11/22> Candida Glabrata- repeat blood culture 11/24 NGTD - Urine culture 11/22 > K pneumoniae - Peritoneal fluid 11/23> Candida albicans - Opthalmology consulted - no evidence of fungal endophthalmitis  - currently receiving Andulafungin and Zosyn - ID has recommended to remove central line to give line holiday and right IJ was removed on 12/1 - placed PICC 12/3- Gen surgery resumed TPN -02/15/2019: Sepsis physiology has resolved significantly.  Infectious disease team is managing fungemia and intra-abdominal infection, antifungal and antibiotics per their recommendations.  -General surgery following, no surgical interventions at this time.   Ischemic changes at bilateral BKA site -This is secondary to severe septic shock, required pressors.  -Seen by vascular surgery Dr. Scot Dock and orthopedics Dr. Sharol Given, they feel she will eventually require an above-knee amputation when she is more stable, patient declines and defers it at this time.   Acute kidney injury - improving - baseline Cr is < 1.0 - Cr rose to 2.66 on 11/26, likely from ATN,improved, back to baseline of <1 now.   ICU delirium -Likely secondary to all above infections, mental status improving slowly -Remains on as needed Haldol however has not required this in several days    DKA (diabetic ketoacidoses), severely uncontrolled with hyperglycemia  - resolved - A1c on 11/23>  15.2 -Continue Lantus and sliding scale, CBGs are stable now  Hypokalemia - replete as needed  Severe protein calorie malnutrition with hypoalbuminemia 02/15/2019: Patient still on TPN.  NG tube has been discontinued.  Currently on ice chips. -SLP is following and will assist in diet advancement.   Anemia due to acute blood loss, critical illness - Has required 2 units of PRBC this admission -Hgb staying in the 7-8 range. No active bleeding.  -  Will repeat labs in am, transfuse if Hgb <7.   Acute thrombocytopenia -  related to septic shock - nadir was > 31 on 11/27 - Platelets have risen back to normal   Elevated HCV antibody - 11/25 - HCV > 11.0  Left phalanx amputation  - on 12/28/18- Hand surgery contacted by general surgery on 11/30 to request suture removal.  Dr. Wynetta Emery spoke to Dr. Caralyn Guile 12/7 who said that RN can remove sutures and place picture in chart and he will review.   -Sutures were removed on 12/9 by RN with no complications.   S/p Bl BKA  Ethics: This is a seriously ill patient with the extremely sick and complicated hospital course with fungemia, intra-abdominal infection following perforation, severe protein calorie malnutrition, uncontrolled diabetes, ischemic changes in bilateral BKA requiring bilateral AKA, remains at risk of additional complications and decline -Palliative medicine team was consulted on 12/7 for goals of care, had meeting with family on 12/9 but no updates from them since then. Per family, they wanted more time to consider further Hettinger discussion.     Time spent in minutes: 15 min  DVT prophylaxis: heparin Code Status: Full code Family Communication: None at bedside Disposition Plan: Stay in stepdown  Consultants:   General surgery  ID  Palliative Care  Procedures:   Intubation/ Extubation  Ex lap Antimicrobials:  Anti-infectives (From admission, onward)   Start     Dose/Rate Route Frequency Ordered Stop   02/18/19 1800  piperacillin-tazobactam (ZOSYN) IVPB 3.375 g     3.375 g 12.5 mL/hr over 240 Minutes Intravenous Every 8 hours 02/18/19 1652     02/05/19 1000  anidulafungin (ERAXIS) 100 mg in sodium chloride 0.9 % 100 mL IVPB     100 mg 78 mL/hr over 100 Minutes Intravenous Every 24 hours 02/04/19 0840     02/04/19 1000  anidulafungin (ERAXIS) 200 mg in sodium chloride 0.9 % 200 mL IVPB     200 mg 78 mL/hr over 200 Minutes Intravenous  Once 02/04/19 0840 02/04/19 1301   02/04/19 0200  fluconazole (DIFLUCAN) IVPB 200 mg  Status:   Discontinued     200 mg 100 mL/hr over 60 Minutes Intravenous Every 24 hours 02/02/19 2302 02/04/19 0840   02/03/19 1800  vancomycin (VANCOCIN) IVPB 750 mg/150 ml premix  Status:  Discontinued     750 mg 150 mL/hr over 60 Minutes Intravenous Every 24 hours 02/03/19 0241 02/03/19 0242   02/03/19 1800  vancomycin (VANCOCIN) IVPB 750 mg/150 ml premix  Status:  Discontinued     750 mg 150 mL/hr over 60 Minutes Intravenous Every 24 hours 02/03/19 0242 02/03/19 0858   02/03/19 0600  piperacillin-tazobactam (ZOSYN) IVPB 3.375 g     3.375 g 12.5 mL/hr over 240 Minutes Intravenous Every 8 hours 02/03/19 0243 02/17/19 0205   02/02/19 2315  fluconazole (DIFLUCAN) IVPB 400 mg     400 mg 100 mL/hr over 120 Minutes Intravenous STAT 02/02/19 2301 02/03/19 0218   02/02/19 1800  ceFEPIme (MAXIPIME) 2 g in sodium chloride 0.9 % 100 mL IVPB     2 g 200 mL/hr over 30 Minutes Intravenous  Once 02/02/19 1745 02/02/19 2030   02/02/19 1800  metroNIDAZOLE (FLAGYL) IVPB 500 mg     500 mg 100 mL/hr over 60 Minutes Intravenous  Once 02/02/19 1745 02/02/19 2237   02/02/19 1800  vancomycin (VANCOCIN) IVPB 1000 mg/200 mL premix  Status:  Discontinued     1,000 mg 200 mL/hr over 60 Minutes  Intravenous  Once 02/02/19 1745 02/02/19 1747   02/02/19 1800  vancomycin (VANCOCIN) 1,250 mg in sodium chloride 0.9 % 250 mL IVPB     1,250 mg 166.7 mL/hr over 90 Minutes Intravenous  Once 02/02/19 1747 02/02/19 2237      Objective: Vitals:   02/22/19 1500 02/22/19 1546 02/22/19 1600 02/22/19 1700  BP: (!) 183/86 (!) 170/69 (!) 170/69 (!) 177/78  Pulse: 78 84 85 88  Resp: 12 18 15 13   Temp:   98.7 F (37.1 C)   TempSrc:      SpO2:      Weight:      Height:        Intake/Output Summary (Last 24 hours) at 02/22/2019 1827 Last data filed at 02/22/2019 1733 Gross per 24 hour  Intake 2867.37 ml  Output 2700 ml  Net 167.37 ml   Filed Weights   02/20/19 0520 02/21/19 0500 02/22/19 0500  Weight: 59.5 kg 57.6 kg 57.3  kg    Examination: Gen: Extremely cachectic chronically ill-appearing female laying in bed awake alert, answers "yes" or "no" to questions, no distress. Calm.   HEENT: PERRLA, Neck supple, no JVD. NG tube in place.  Lungs: No respiratory distress, no wheezing.  CVS: RRR Abd: Soft, mild diffuse tenderness, mildly distended. JP drain in place with bile.  Extremities: Bilateral BKA with ischemic changes skin: Ischemic changes, discolorations noted in both lower legs near BKA site Flexi-Seal noted with stool content.  Data Reviewed: I have personally reviewed following labs and imaging studies  CBC: Recent Labs  Lab 02/16/19 0254 02/17/19 0522 02/18/19 0155 02/20/19 0254 02/20/19 1858 02/21/19 1110 02/22/19 0203  WBC 5.2 7.2 6.7 9.7  --  12.3* 11.6*  NEUTROABS 3.5 4.5 4.5  --   --   --   --   HGB 6.1* 7.6* 7.8* 7.0* 7.4* 7.1* 8.1*  HCT 19.9* 24.8* 25.3* 23.0* 23.5* 22.7* 26.5*  MCV 94.3 94.3 95.1 93.9  --  93.8 94.3  PLT 338 341 322 356  --  397 884   Basic Metabolic Panel: Recent Labs  Lab 02/17/19 0522 02/18/19 0155 02/19/19 0427 02/20/19 0254 02/21/19 1110 02/22/19 0203  NA 136 136 134* 135 137 137  K 4.6 4.8 4.9 4.6 4.6 4.3  CL 106 109 107 106 108 108  CO2 19* 19* 21* 21* 23 20*  GLUCOSE 196* 118* 142* 146* 166* 162*  BUN 32* 33* 36* 38* 40* 36*  CREATININE 1.24* 1.14* 0.99 0.93 0.84 0.84  CALCIUM 8.3* 8.3* 8.2* 8.4* 8.3* 8.7*  MG 1.8 1.9 2.1 2.2  --  2.0  PHOS 2.6 3.2 3.9 3.8  --  3.2   GFR: Estimated Creatinine Clearance: 58.4 mL/min (by C-G formula based on SCr of 0.84 mg/dL). Liver Function Tests: Recent Labs  Lab 02/16/19 0254 02/17/19 0522 02/20/19 0254 02/21/19 1110 02/22/19 0203  AST  --  9* 15 16 18   ALT  --  9 8 10 12   ALKPHOS  --  54 72 104 142*  BILITOT  --  0.4 0.4 0.5 0.3  PROT  --  5.8* 5.8* 6.0* 6.0*  ALBUMIN 1.2* 1.2* 1.0* 1.1* 1.1*   No results for input(s): LIPASE, AMYLASE in the last 168 hours. No results for input(s): AMMONIA  in the last 168 hours. Coagulation Profile: No results for input(s): INR, PROTIME in the last 168 hours. Cardiac Enzymes: No results for input(s): CKTOTAL, CKMB, CKMBINDEX, TROPONINI in the last 168 hours. BNP (last 3 results) No results  for input(s): PROBNP in the last 8760 hours. HbA1C: No results for input(s): HGBA1C in the last 72 hours. CBG: Recent Labs  Lab 02/21/19 0520 02/21/19 1214 02/21/19 1704 02/22/19 0002 02/22/19 0600  GLUCAP 133* 168* 133* 169* 166*   Lipid Profile: No results for input(s): CHOL, HDL, LDLCALC, TRIG, CHOLHDL, LDLDIRECT in the last 72 hours. Thyroid Function Tests: No results for input(s): TSH, T4TOTAL, FREET4, T3FREE, THYROIDAB in the last 72 hours. Anemia Panel: No results for input(s): VITAMINB12, FOLATE, FERRITIN, TIBC, IRON, RETICCTPCT in the last 72 hours. Urine analysis:    Component Value Date/Time   COLORURINE YELLOW 02/12/2019 1401   APPEARANCEUR CLOUDY (A) 02/12/2019 1401   LABSPEC 1.010 02/12/2019 1401   PHURINE 5.0 02/12/2019 1401   GLUCOSEU >=500 (A) 02/12/2019 1401   HGBUR MODERATE (A) 02/12/2019 1401   BILIRUBINUR NEGATIVE 02/12/2019 1401   BILIRUBINUR N 04/28/2016 1603   KETONESUR NEGATIVE 02/12/2019 1401   PROTEINUR 30 (A) 02/12/2019 1401   UROBILINOGEN 0.2 04/28/2016 1603   UROBILINOGEN 0.2 06/18/2013 0650   NITRITE NEGATIVE 02/12/2019 1401   LEUKOCYTESUR NEGATIVE 02/12/2019 1401   Recent Results (from the past 240 hour(s))  Culture, blood (routine x 2)     Status: None (Preliminary result)   Collection Time: 02/18/19  5:54 PM   Specimen: BLOOD LEFT ARM  Result Value Ref Range Status   Specimen Description   Final    BLOOD LEFT ARM Performed at [facility], [place] [address] [place], [place] [address]    Special Requests   Final    BOTTLES DRAWN AEROBIC AND ANAEROBIC Blood Culture adequate volume Performed at [facility] [address], [place], [place] [address]    Culture    Final    NO GROWTH 3 DAYS Performed at [facility], [place] [address], [place], [place] [address]    Report Status PENDING  Incomplete  Culture, blood (routine x 2)     Status: None (Preliminary result)   Collection Time: 02/18/19  6:07 PM   Specimen: BLOOD LEFT ARM  Result Value Ref Range Status   Specimen Description   Final    BLOOD LEFT ARM Performed at [facility] [address], [place], [place] [address]    Special Requests   Final    BOTTLES DRAWN AEROBIC AND ANAEROBIC Blood Culture adequate volume Performed at [facility] [address], [place], [place] [address]    Culture   Final    NO GROWTH 3 DAYS Performed at [facility], [place] [address], [place], [place] [address]    Report Status PENDING  Incomplete    Radiology Studies: No results found.  Scheduled Meds: . chlorhexidine gluconate (MEDLINE KIT)  15 mL Mouth Rinse BID  . Chlorhexidine Gluconate Cloth  6 each Topical Daily  . collagenase   Topical Daily  . Gerhardt's butt cream   Topical TID  . heparin injection (subcutaneous)  5,000 Units Subcutaneous Q8H  . insulin aspart  0-20 Units Subcutaneous Q6H  . metoprolol tartrate  2.5 mg Intravenous Q6H  . pantoprazole (PROTONIX) IV  40 mg Intravenous Q12H  . sodium chloride flush  10-40 mL Intracatheter Q12H  . sucralfate  1 g Oral TID WC & HS   Continuous Infusions: . sodium chloride 10 mL/hr at 02/18/19 1833  . sodium chloride Stopped (02/13/19 2137)  . anidulafungin Stopped (02/22/19 1138)  . piperacillin-tazobactam (ZOSYN)  IV 12.5 mL/hr at 02/22/19 1733  .  TPN ADULT (ION) 90 mL/hr at 02/22/19 1733     LOS: 20 days   Time spent: 31mn SBlain Pais MD  02/22/2019, 6:27 PM

## 2019-02-22 NOTE — Progress Notes (Signed)
20 Days Post-Op  Subjective: No complaints  Objective: Vital signs in last 24 hours: Temp:  [97.7 F (36.5 C)-99.8 F (37.7 C)] 98.5 F (36.9 C) (12/12 0800) Pulse Rate:  [74-96] 80 (12/12 0700) Resp:  [13-22] 13 (12/12 0700) BP: (128-170)/(45-89) 151/71 (12/12 0700) SpO2:  [96 %-100 %] 96 % (12/12 0000) Weight:  [57.3 kg] 57.3 kg (12/12 0500) Last BM Date: 02/21/19  Intake/Output from previous day: 12/11 0701 - 12/12 0700 In: 2391.8 [I.V.:2117.6; IV Piggyback:274.2] Out: [id number] [Urine:1950; Emesis/NG output:300; Drains:900; Stool:25] Intake/Output this shift: No intake/output data recorded.  General appearance: arousable but sleepy Resp: no distres Cardio: regular rate and rhythm GI: soft, minimal tenderness.  JP: bilious, 949ml/24h  Lab Results:  Recent Labs    02/21/19 1110 02/22/19 0203  WBC 12.3* 11.6*  HGB 7.1* 8.1*  HCT 22.7* 26.5*  PLT 397 376   BMET Recent Labs    02/21/19 1110 02/22/19 0203  NA 137 137  K 4.6 4.3  CL 108 108  CO2 23 20*  GLUCOSE 166* 162*  BUN 40* 36*  CREATININE 0.84 0.84  CALCIUM 8.3* 8.7*   PT/INR No results for input(s): LABPROT, INR in the last 72 hours. ABG No results for input(s): PHART, HCO3 in the last 72 hours.  Invalid input(s): PCO2, PO2  Studies/Results: No results found.  Anti-infectives: Anti-infectives (From admission, onward)   Start     Dose/Rate Route Frequency Ordered Stop   02/18/19 1800  piperacillin-tazobactam (ZOSYN) IVPB 3.375 g     3.375 g 12.5 mL/hr over 240 Minutes Intravenous Every 8 hours 02/18/19 1652     02/05/19 1000  anidulafungin (ERAXIS) 100 mg in sodium chloride 0.9 % 100 mL IVPB     100 mg 78 mL/hr over 100 Minutes Intravenous Every 24 hours 02/04/19 0840     02/04/19 1000  anidulafungin (ERAXIS) 200 mg in sodium chloride 0.9 % 200 mL IVPB     200 mg 78 mL/hr over 200 Minutes Intravenous  Once 02/04/19 0840 02/04/19 1301   02/04/19 0200  fluconazole (DIFLUCAN) IVPB 200 mg   Status:  Discontinued     200 mg 100 mL/hr over 60 Minutes Intravenous Every 24 hours 02/02/19 2302 02/04/19 0840   02/03/19 1800  vancomycin (VANCOCIN) IVPB 750 mg/150 ml premix  Status:  Discontinued     750 mg 150 mL/hr over 60 Minutes Intravenous Every 24 hours 02/03/19 0241 02/03/19 0242   02/03/19 1800  vancomycin (VANCOCIN) IVPB 750 mg/150 ml premix  Status:  Discontinued     750 mg 150 mL/hr over 60 Minutes Intravenous Every 24 hours 02/03/19 0242 02/03/19 0858   02/03/19 0600  piperacillin-tazobactam (ZOSYN) IVPB 3.375 g     3.375 g 12.5 mL/hr over 240 Minutes Intravenous Every 8 hours 02/03/19 0243 02/17/19 0205   02/02/19 2315  fluconazole (DIFLUCAN) IVPB 400 mg     400 mg 100 mL/hr over 120 Minutes Intravenous STAT 02/02/19 2301 02/03/19 0218   02/02/19 1800  ceFEPIme (MAXIPIME) 2 g in sodium chloride 0.9 % 100 mL IVPB     2 g 200 mL/hr over 30 Minutes Intravenous  Once 02/02/19 1745 02/02/19 2030   02/02/19 1800  metroNIDAZOLE (FLAGYL) IVPB 500 mg     500 mg 100 mL/hr over 60 Minutes Intravenous  Once 02/02/19 1745 02/02/19 2237   02/02/19 1800  vancomycin (VANCOCIN) IVPB 1000 mg/200 mL premix  Status:  Discontinued     1,000 mg 200 mL/hr over 60 Minutes Intravenous  Once  02/02/19 1745 02/02/19 1747   02/02/19 1800  vancomycin (VANCOCIN) 1,250 mg in sodium chloride 0.9 % 250 mL IVPB     1,250 mg 166.7 mL/hr over 90 Minutes Intravenous  Once 02/02/19 1747 02/02/19 2237      Assessment/Plan: s/p Procedure(s): EXPLORATORY LAPAROTOMY WITH OVERSEW OF DUODENAL ULCER Continue bowel rest and tpn for nutrition support.  Pt with bile leak VDRF- extubated 11/29 Poorly controlled DM2- A1c15.2 S/p bilateral BKA Ischemic changes to both LE- defer to medicine,but would benefit from eval from Dr. 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 No plans for sharp debridement at this time.  POD20,S/pex lap with oversew of duodenal ulcer,with Graham patch 11/23,Dr. Lucia Gaskins, for perforated prepyloric ulcer with gross contamination -now with apparent bile leak, although UGI negative but with limitations.CT scan also confirms leak but unable to tell where from. Small bowel appears flaccid and not healthy, but given significant high density material any specific details are difficult to conclude. - BID wet to dry dressing changes to midline abdominal wound - BID PPI; added carafate -pulm toilet and IS -OOB to chair as much as possible -mobilize, PT/OT -cont TNAgiven leak, will be unable to take in enteral nutrition for the foreseeable future -will contJP Drain to suction today to help with drainage of all the bile from her leak. Appreciate palliative care's assistance with this patient -defer further surgical intervention at this time as this will put her at higher risk for complications, etc.  Will cont with this management to control her leak and hopefully help her improve; however, she is not doing a whole lot and appears weak and frail.  Not sure she will do well overall.  ID - Eraxis 11/24>>Zosyn 11/23 >>12/6, 12/8 --> FEN -NPO/NGT VTE - NoSCDs,Bilateral BKa's - sq heparin  Foley -out Follow up -Dr. Lucia Gaskins  LOS: 20 days    Gina [name] [phone]

## 2019-02-22 NOTE — Progress Notes (Signed)
PHARMACY - TOTAL PARENTERAL NUTRITION CONSULT NOTE   Indication: Small bowel obstruction  Patient Measurements: Height: 5\' 2"  (157.5 cm) Weight: 126 lb 5.2 oz (57.3 kg) IBW/kg (Calculated) : 50.1 TPN AdjBW (KG): 57.9 Body mass index is 23.1 kg/m. Usual Weight: 55-60 kg Ideal Body Weight:  44.2 kg(Adjusted IBW for bilateral BKA)  Assessment:  82 yoF with poorly controlled DM s/p BL BKA, admitted 11/23 with AMS, n/v, abdominal pain. CT abdomen showed pneumoperitoneum and mesenteric ischemia. Underwent ex lap on 11/23 with finding of perforated prepyloric ulcer which was oversewn, along with gross contamination of the peritoneum. Hospital course complicated by DKA, AKI, acute thrombocytopenia, and C. glabrata candidemia. Patient remains with high NGT d/t bowel edema as demonstrated on UGI. TPN started on 11/30 but was stopped shortly thereafter to facilitate central line holiday for candidemia. Repeat blood cultures remain negative to date, and TPN now restarting with replacement of central line. Patient at risk for refeeding given prolonged lack of nutrition.  Glucose / Insulin: Hx DM; prev required D10 W infusion to maintain euglycemia  Current resistant Novolog scale q6h (12/5)   CBGs ( goal <150)  range 133-168, 15 units of Novolog  past 24 hrs, 20 units of regular insulin added to TPN  Electrolytes: WNL, corrected Ca 11 Renal: AKI on admit, SCr 0.84  LFTs / TGs: WNL (11/30), 126 (12/4), 116 ( 12/7)  Prealbumin / albumin: both low (11/30), 5.2 (12/4). 5.3 ( 12/7)  I/O: 900 ml from JP drain,  1950 ml UOP,  300 ml NGO MIVF: 1/2 NS at 10 ml/hr GI Imaging: 11/30 UGI: high NG output & bowel edema Surgeries / Procedures: 11/22 ex lap: oversew of duodenal ulcer with [name] patch  Central access: PICC replaced 12/3  TPN start date: resumed 12/3 (~ 2200)  Nutritional Goals (per RD recommendation on 12/11): Kcal:  2025-2330 kcal Protein:  115-130 grams Fluid:  >/= 2 L/day Goal TPN rate  is 90 mL/hr (provides 129 g of protein and 2047 kcals per day) - GIR 3.3 mg/kg/min  Current Nutrition:  NPO  Plan: at 1800  Continue custom TPN at 90 mL/hr, the target rate   Electrolytes in TPN:  61mEq/L Na, 25 mEq/L K, 3 mEq/L Ca, Magnesium 10 mEq/L , and 10 mmol/L of Phos ; continue Cl:Ac ratio 1:2  Standard MVI and trace elements in TPN on MWF only d/t national shortage  Continue SSI to resistant with q6h CBG checks and adjust as needed   Continue 20 units of Novolog to TPN  Continue 1/2 NS at [facility]    Monitor TPN labs on Mon/Thurs  BMET, phos, magnesium with AM labs   Royetta Asal, PharmD, BCPS 02/22/2019 7:39 AM

## 2019-02-23 LAB — BASIC METABOLIC PANEL
Anion gap: 5 (ref 5–15)
BUN: 43 mg/dL — ABNORMAL HIGH (ref 6–20)
CO2: 25 mmol/L (ref 22–32)
Calcium: 8.6 mg/dL — ABNORMAL LOW (ref 8.9–10.3)
Chloride: 108 mmol/L (ref 98–111)
Creatinine, Ser: 0.72 mg/dL (ref 0.44–1.00)
GFR calc Af Amer: >60 mL/min (ref >60)
GFR calc non Af Amer: >60 mL/min (ref >60)
Glucose, Bld: 118 mg/dL — ABNORMAL HIGH (ref 70–99)
Potassium: 4.5 mmol/L (ref 3.5–5.1)
Sodium: 138 mmol/L (ref 135–145)

## 2019-02-23 LAB — GLUCOSE, CAPILLARY
Glucose-Capillary: 164 mg/dL — ABNORMAL HIGH (ref 70–99)
Glucose-Capillary: 188 mg/dL — ABNORMAL HIGH (ref 70–99)
Glucose-Capillary: 117 mg/dL — ABNORMAL HIGH (ref 70–99)
Glucose-Capillary: 152 mg/dL — ABNORMAL HIGH (ref 70–99)
Glucose-Capillary: 110 mg/dL — ABNORMAL HIGH (ref 70–99)

## 2019-02-23 LAB — PHOSPHORUS: Phosphorus: 3.4 mg/dL (ref 2.5–4.6)

## 2019-02-23 LAB — MAGNESIUM: Magnesium: 2.3 mg/dL (ref 1.7–2.4)

## 2019-02-23 MED ORDER — TRAVASOL 10 % IV SOLN
INTRAVENOUS | Status: AC
  Administered 2019-02-23: 17:00:00 via INTRAVENOUS
  Filled 2019-02-23: qty 1296

## 2019-02-23 NOTE — Progress Notes (Signed)
PHARMACY - TOTAL PARENTERAL NUTRITION CONSULT NOTE   Indication: Small bowel obstruction  Patient Measurements: Height: 5\' 2"  (157.5 cm) Weight: 126 lb 1.7 oz (57.2 kg) IBW/kg (Calculated) : 50.1 TPN AdjBW (KG): 57.9 Body mass index is 23.06 kg/m. Usual Weight: 55-60 kg Ideal Body Weight:  44.2 kg(Adjusted IBW for bilateral BKA)  Assessment:  51 yoF with poorly controlled DM s/p BL BKA, admitted 11/23 with AMS, n/v, abdominal pain. CT abdomen showed pneumoperitoneum and mesenteric ischemia. Underwent ex lap on 11/23 with finding of perforated prepyloric ulcer which was oversewn, along with gross contamination of the peritoneum. Hospital course complicated by DKA, AKI, acute thrombocytopenia, and C. glabrata candidemia. Patient remains with high NGT d/t bowel edema as demonstrated on UGI. TPN started on 11/30 but was stopped shortly thereafter to facilitate central line holiday for candidemia. Repeat blood cultures remain negative to date, and TPN now restarting with replacement of central line. Patient at risk for refeeding given prolonged lack of nutrition.  Glucose / Insulin: Hx DM; prev required D10 W infusion to maintain euglycemia  Current resistant Novolog scale q6h (12/5)   CBGs ( goal <150)  range 164-213, 19 units of Novolog  past 24 hrs, 20 units of regular insulin added to TPN  Electrolytes: WNL, corrected Ca 11 Renal: AKI on admit, SCr 0.84  LFTs / TGs: WNL (11/30), 126 (12/4), 116 ( 12/7)  Prealbumin / albumin: both low (11/30), 5.2 (12/4). 5.3 ( 12/7)  I/O: 1050 ml from JP drain,  1300 ml UOP,  200 ml NGO MIVF: 1/2 NS at 10 ml/hr GI Imaging: 11/30 UGI: high NG output & bowel edema Surgeries / Procedures: 11/22 ex lap: oversew of duodenal ulcer with [name] patch  Central access: PICC replaced 12/3  TPN start date: resumed 12/3 (~ 2200)  Nutritional Goals (per RD recommendation on 12/11): Kcal:  2025-2330 kcal Protein:  115-130 grams Fluid:  >/= 2 L/day Goal TPN  rate is 90 mL/hr (provides 129 g of protein and 2047 kcals per day) - GIR 3.3 mg/kg/min  Current Nutrition:  NPO  ------ AM labs have not resulted by time TPN is due to be made at [facility], so TPN ordered is same as TPN made for patient on 12/12.------  Plan: at 1800  Continue custom TPN at 90 mL/hr, the target rate   Electrolytes in TPN:  5mEq/L Na, 25 mEq/L K, 3 mEq/L Ca, Magnesium 10 mEq/L , and 10 mmol/L of Phos ; continue Cl:Ac ratio 1:2  Standard MVI and trace elements in TPN on MWF only d/t national shortage  Continue SSI to resistant with q6h CBG checks and adjust as needed   Continue 20 units of Novolog to TPN  Continue 1/2 NS at [facility]    Monitor TPN labs on Mon/Thurs   Royetta Asal, PharmD, BCPS 02/23/2019 12:27 PM

## 2019-02-23 NOTE — Progress Notes (Signed)
PROGRESS NOTE   Gina [name]   NUU:[id number]  DOB: 09/11/1960  DOA: 02/02/2019 PCP: [name], MD   Brief Narrative: PCCM transferred to [facility] 11/30 -The patient is a 58 year old female with severe protein calorie malnutrition, poorly controlled type 2 diabetes mellitus with bilateral foot ulcers, status post bilateral below-knee knee amputation, recent left phalanx amputation, schizophrenia, hypertension, severe protein calorie malnutrition was admitted to the ICU with septic shock , she was found to have a perforated gastric ulcer with pneumoperitoneum, also had evidence of DKA on admission -she was intubated, admitted to the ICU, treated with broad-spectrum antibiotics, pressors, aggressive fluid resuscitation, ultimately underwent ex lap on 11/23 which noted a perforated prepyloric ulcer which was oversewn and 3.1 L of contaminated peritoneal fluid was drained, she was subsequently extubated on 11/29. -She has had a extensive complicated hospital course notable for DKA, acute kidney injury, acute blood loss anemia, acute thrombocytopenia, intra-abdominal sepsis, bacteremia, fungemia. -General surgery continues to follow closely, NG tube was removed on 11/25, currently on TPN, during this hospital stay was also noted to have severe ischemic changes at her BKA site, vascular surgery as well as Dr. Sharol Given were consulted, they recommended consideration of above-knee amputation when patient is more stable -Palliative medicine team was consulted on 12/7  Subjective: Patient seen and examined this morning. She continues to have bile leak with JP drain to suction, full of bile. NG tube also with bile. She remains on TPN with no plan for po intake at this time. She is weak and frail, no plan for surgical intervention.   Assessment & Plan:  Severe septic shock due to ruptured gastric ulcer with acute peritonitis, fungemia and UTI - ID consulted on 02/04/19 and following.  - Blood cultures from  11/22> Candida Glabrata- repeat blood culture 11/24 NGTD - Urine culture 11/22 > K pneumoniae - Peritoneal fluid 11/23> Candida albicans - Opthalmology consulted - no evidence of fungal endophthalmitis  - currently receiving Andulafungin and Zosyn - ID has recommended to remove central line to give line holiday and right IJ was removed on 12/1 - placed PICC 12/3- Gen surgery resumed TPN -02/15/2019: Sepsis physiology has resolved significantly.  Infectious disease team is managing fungemia and intra-abdominal infection, antifungal and antibiotics per their recommendations.  -General surgery following, no surgical interventions at this time.   Ischemic changes at bilateral BKA site -This is secondary to severe septic shock, required pressors.  -Seen by vascular surgery Dr. Scot Dock and orthopedics Dr. Sharol Given, they feel she will eventually require an above-knee amputation when she is more stable, patient declines and defers it at this time.   Acute kidney injury - improving - baseline Cr is < 1.0 - Cr rose to 2.66 on 11/26, likely from ATN,improved, back to baseline of <1 now.   ICU delirium -Likely secondary to all above infections, mental status improving slowly -Remains on as needed Haldol however has not required this in several days    DKA (diabetic ketoacidoses), severely uncontrolled with hyperglycemia  - resolved - A1c on 11/23>  15.2 -Continue Lantus and sliding scale, CBGs are stable now  Hypokalemia - replete as needed  Severe protein calorie malnutrition with hypoalbuminemia 02/15/2019: Patient still on TPN.  NG tube has been discontinued.  Currently on ice chips. -SLP is following and will assist in diet advancement.   Anemia due to acute blood loss, critical illness - Has required 2 units of PRBC this admission -Hgb staying in the 7-8 range. No active bleeding.  -  Will repeat labs in am, transfuse if Hgb <7.   Acute thrombocytopenia - related to septic shock - nadir  was > 31 on 11/27 - Platelets have risen back to normal   Elevated HCV antibody - 11/25 - HCV > 11.0  Left phalanx amputation  - on 12/28/18- Hand surgery contacted by general surgery on 11/30 to request suture removal.  Dr. Wynetta Emery spoke to Dr. Caralyn Guile 12/7 who said that RN can remove sutures and place picture in chart and he will review.   -Sutures were removed on 12/9 by RN with no complications.   S/p Bl BKA  Ethics: This is a seriously ill patient with the extremely sick and complicated hospital course with fungemia, intra-abdominal infection following perforation, severe protein calorie malnutrition, uncontrolled diabetes, ischemic changes in bilateral BKA requiring bilateral AKA, remains at risk of additional complications and decline -Palliative medicine team was consulted on 12/7 for goals of care, had meeting with family on 12/9 but no updates from them since then. Per family, they wanted more time to consider further [place] discussion.   -Patient needs another family meeting for [place] discussion.    Time spent in minutes: 15 min  DVT prophylaxis: heparin Code Status: Full code Family Communication: None at bedside Disposition Plan: Stay in stepdown  Consultants:   General surgery  ID  Palliative Care  Procedures:   Intubation/ Extubation  Ex lap Antimicrobials:  Anti-infectives (From admission, onward)   Start     Dose/Rate Route Frequency Ordered Stop   02/18/19 1800  piperacillin-tazobactam (ZOSYN) IVPB 3.375 g     3.375 g 12.5 mL/hr over 240 Minutes Intravenous Every 8 hours 02/18/19 1652     02/05/19 1000  anidulafungin (ERAXIS) 100 mg in sodium chloride 0.9 % 100 mL IVPB     100 mg 78 mL/hr over 100 Minutes Intravenous Every 24 hours 02/04/19 0840     02/04/19 1000  anidulafungin (ERAXIS) 200 mg in sodium chloride 0.9 % 200 mL IVPB     200 mg 78 mL/hr over 200 Minutes Intravenous  Once 02/04/19 0840 02/04/19 1301   02/04/19 0200  fluconazole (DIFLUCAN) IVPB  200 mg  Status:  Discontinued     200 mg 100 mL/hr over 60 Minutes Intravenous Every 24 hours 02/02/19 2302 02/04/19 0840   02/03/19 1800  vancomycin (VANCOCIN) IVPB 750 mg/150 ml premix  Status:  Discontinued     750 mg 150 mL/hr over 60 Minutes Intravenous Every 24 hours 02/03/19 0241 02/03/19 0242   02/03/19 1800  vancomycin (VANCOCIN) IVPB 750 mg/150 ml premix  Status:  Discontinued     750 mg 150 mL/hr over 60 Minutes Intravenous Every 24 hours 02/03/19 0242 02/03/19 0858   02/03/19 0600  piperacillin-tazobactam (ZOSYN) IVPB 3.375 g     3.375 g 12.5 mL/hr over 240 Minutes Intravenous Every 8 hours 02/03/19 0243 02/17/19 0205   02/02/19 2315  fluconazole (DIFLUCAN) IVPB 400 mg     400 mg 100 mL/hr over 120 Minutes Intravenous STAT 02/02/19 2301 02/03/19 0218   02/02/19 1800  ceFEPIme (MAXIPIME) 2 g in sodium chloride 0.9 % 100 mL IVPB     2 g 200 mL/hr over 30 Minutes Intravenous  Once 02/02/19 1745 02/02/19 2030   02/02/19 1800  metroNIDAZOLE (FLAGYL) IVPB 500 mg     500 mg 100 mL/hr over 60 Minutes Intravenous  Once 02/02/19 1745 02/02/19 2237   02/02/19 1800  vancomycin (VANCOCIN) IVPB 1000 mg/200 mL premix  Status:  Discontinued  1,000 mg 200 mL/hr over 60 Minutes Intravenous  Once 02/02/19 1745 02/02/19 1747   02/02/19 1800  vancomycin (VANCOCIN) 1,250 mg in sodium chloride 0.9 % 250 mL IVPB     1,250 mg 166.7 mL/hr over 90 Minutes Intravenous  Once 02/02/19 1747 02/02/19 2237      Objective: Vitals:   02/23/19 0800 02/23/19 0900 02/23/19 1000 02/23/19 1100  BP: (!) 130/49 (!) 128/53 132/63 128/62  Pulse: 91 90 95 93  Resp: _0 Temp: 98.5 F (36.9 C)     TempSrc:      SpO2:      Weight:      Height:        Intake/Output Summary (Last 24 hours) at 02/23/2019 1207 Last data filed at 02/23/2019 1009 Gross per 24 hour  Intake 2128.16 ml  Output 2200 ml  Net -71.84 ml   Filed Weights   02/21/19 0500 02/22/19 0500 02/23/19 0500  Weight: 57.6 kg 57.3  kg 57.2 kg    Examination: Gen: Extremely cachectic chronically ill-appearing female laying in bed awake alert, answers "yes" or "no" to questions, no distress. Calm.   HEENT: PERRLA, Neck supple, no JVD. NG tube in place.  Lungs: No respiratory distress, no wheezing.  CVS: RRR Abd: Soft, mild diffuse tenderness, mildly distended. JP drain in place with bile.  Extremities: Bilateral BKA with ischemic changes skin: Ischemic changes, discolorations noted in both lower legs near BKA site. Bilateral lower extremities covered with bandages that are c/d/i.   Flexi-Seal noted with stool content.  Data Reviewed: I have personally reviewed following labs and imaging studies  CBC: Recent Labs  Lab 02/17/19 0522 02/18/19 0155 02/20/19 0254 02/20/19 1858 02/21/19 1110 02/22/19 0203  WBC 7.2 6.7 9.7  --  12.3* 11.6*  NEUTROABS 4.5 4.5  --   --   --   --   HGB 7.6* 7.8* 7.0* 7.4* 7.1* 8.1*  HCT 24.8* 25.3* 23.0* 23.5* 22.7* 26.5*  MCV 94.3 95.1 93.9  --  93.8 94.3  PLT 341 322 356  --  397 623   Basic Metabolic Panel: Recent Labs  Lab 02/17/19 0522 02/18/19 0155 02/19/19 0427 02/20/19 0254 02/21/19 1110 02/22/19 0203  NA 136 136 134* 135 137 137  K 4.6 4.8 4.9 4.6 4.6 4.3  CL 106 109 107 106 108 108  CO2 19* 19* 21* 21* 23 20*  GLUCOSE 196* 118* 142* 146* 166* 162*  BUN 32* 33* 36* 38* 40* 36*  CREATININE 1.24* 1.14* 0.99 0.93 0.84 0.84  CALCIUM 8.3* 8.3* 8.2* 8.4* 8.3* 8.7*  MG 1.8 1.9 2.1 2.2  --  2.0  PHOS 2.6 3.2 3.9 3.8  --  3.2   GFR: Estimated Creatinine Clearance: 58.4 mL/min (by C-G formula based on SCr of 0.84 mg/dL). Liver Function Tests: Recent Labs  Lab 02/17/19 0522 02/20/19 0254 02/21/19 1110 02/22/19 0203  AST 9* _1 ALT _2 ALKPHOS 54 72 104 142*  BILITOT 0.4 0.4 0.5 0.3  PROT 5.8* 5.8* 6.0* 6.0*  ALBUMIN 1.2* 1.0* 1.1* 1.1*   No results for input(s): LIPASE, AMYLASE in the last 168 hours. No results for input(s): AMMONIA in the last  168 hours. Coagulation Profile: No results for input(s): INR, PROTIME in the last 168 hours. Cardiac Enzymes: No results for input(s): CKTOTAL, CKMB, CKMBINDEX, TROPONINI in the last 168 hours. BNP (last 3 results) No results for input(s): PROBNP in the last 8760 hours. HbA1C: No  results for input(s): HGBA1C in the last 72 hours. CBG: Recent Labs  Lab 02/22/19 1131 02/22/19 1805 02/23/19 0035 02/23/19 0603 02/23/19 1126  GLUCAP 213* 190* 188* 164* 117*   Lipid Profile: No results for input(s): CHOL, HDL, LDLCALC, TRIG, CHOLHDL, LDLDIRECT in the last 72 hours. Thyroid Function Tests: No results for input(s): TSH, T4TOTAL, FREET4, T3FREE, THYROIDAB in the last 72 hours. Anemia Panel: No results for input(s): VITAMINB12, FOLATE, FERRITIN, TIBC, IRON, RETICCTPCT in the last 72 hours. Urine analysis:    Component Value Date/Time   COLORURINE YELLOW 02/12/2019 1401   APPEARANCEUR CLOUDY (A) 02/12/2019 1401   LABSPEC 1.010 02/12/2019 1401   PHURINE 5.0 02/12/2019 1401   GLUCOSEU >=500 (A) 02/12/2019 1401   HGBUR MODERATE (A) 02/12/2019 1401   BILIRUBINUR NEGATIVE 02/12/2019 1401   BILIRUBINUR N 04/28/2016 1603   KETONESUR NEGATIVE 02/12/2019 1401   PROTEINUR 30 (A) 02/12/2019 1401   UROBILINOGEN 0.2 04/28/2016 1603   UROBILINOGEN 0.2 06/18/2013 0650   NITRITE NEGATIVE 02/12/2019 1401   LEUKOCYTESUR NEGATIVE 02/12/2019 1401   Recent Results (from the past 240 hour(s))  Culture, blood (routine x 2)     Status: None (Preliminary result)   Collection Time: 02/18/19  5:54 PM   Specimen: BLOOD LEFT ARM  Result Value Ref Range Status   Specimen Description   Final    BLOOD LEFT ARM Performed at [facility], [place] [address], [place], [place] [address]    Special Requests   Final    BOTTLES DRAWN AEROBIC AND ANAEROBIC Blood Culture adequate volume Performed at [facility] [name] [place], [place] [address]    Culture   Final     NO GROWTH 4 DAYS Performed at [facility], [place] [address] [place], [place] [address]    Report Status PENDING  Incomplete  Culture, blood (routine x 2)     Status: None (Preliminary result)   Collection Time: 02/18/19  6:07 PM   Specimen: BLOOD LEFT ARM  Result Value Ref Range Status   Specimen Description   Final    BLOOD LEFT ARM Performed at [facility] [address] [place], [place] [address]    Special Requests   Final    BOTTLES DRAWN AEROBIC AND ANAEROBIC Blood Culture adequate volume Performed at [facility] [address], [place], [place] [address]    Culture   Final    NO GROWTH 4 DAYS Performed at [facility], [place] [address] [place],  [address]    Report Status PENDING  Incomplete    Radiology Studies: No results found.  Scheduled Meds: . chlorhexidine gluconate (MEDLINE KIT)  15 mL Mouth Rinse BID  . Chlorhexidine Gluconate Cloth  6 each Topical Daily  . collagenase   Topical Daily  . Gerhardt's butt cream   Topical TID  . heparin injection (subcutaneous)  5,000 Units Subcutaneous Q8H  . insulin aspart  0-20 Units Subcutaneous Q6H  . metoprolol tartrate  2.5 mg Intravenous Q6H  . pantoprazole (PROTONIX) IV  40 mg Intravenous Q12H  . sodium chloride flush  10-40 mL Intracatheter Q12H  . sucralfate  1 g Oral TID WC & HS   Continuous Infusions: . sodium chloride 10 mL/hr at 02/18/19 1833  . sodium chloride Stopped (02/13/19 2137)  . anidulafungin 78 mL/hr at 02/23/19 1009  . piperacillin-tazobactam (ZOSYN)  IV 12.5 mL/hr at 02/23/19 1009  . TPN ADULT (ION) 90 mL/hr at 02/23/19  1009     LOS: 21 days   Time spent: 67mn SBlain Pais MD  02/23/2019, 12:07 PM

## 2019-02-23 NOTE — Progress Notes (Signed)
21 Days Post-Op  Subjective: No complaints  Objective: Vital signs in last 24 hours: Temp:  [97.6 F (36.4 C)-100.7 F (38.2 C)] 100.5 F (38.1 C) (12/13 0400) Pulse Rate:  [78-98] 87 (12/13 0600) Resp:  [10-22] 20 (12/13 0600) BP: (112-186)/(50-86) 123/52 (12/13 0600) SpO2:  [99 %-100 %] 100 % (12/13 0600) Weight:  [57.2 kg] 57.2 kg (12/13 0500) Last BM Date: 02/22/19  Intake/Output from previous day: 12/12 0701 - 12/13 0700 In: 2436.3 [I.V.:2156.2; IV Piggyback:280] Out: 2550 [Urine:1300; Emesis/NG output:200; Drains:1050] Intake/Output this shift: No intake/output data recorded.  General appearance: arousable but sleepy Resp: no distress Cardio: regular rate and rhythm GI: soft, minimal tenderness.  JP: bilious, 1085ml/24h  Lab Results:  Recent Labs    02/21/19 1110 02/22/19 0203  WBC 12.3* 11.6*  HGB 7.1* 8.1*  HCT 22.7* 26.5*  PLT 397 376   BMET Recent Labs    02/21/19 1110 02/22/19 0203  NA 137 137  K 4.6 4.3  CL 108 108  CO2 23 20*  GLUCOSE 166* 162*  BUN 40* 36*  CREATININE 0.84 0.84  CALCIUM 8.3* 8.7*   PT/INR No results for input(s): LABPROT, INR in the last 72 hours. ABG No results for input(s): PHART, HCO3 in the last 72 hours.  Invalid input(s): PCO2, PO2  Studies/Results: No results found.  Anti-infectives: Anti-infectives (From admission, onward)   Start     Dose/Rate Route Frequency Ordered Stop   02/18/19 1800  piperacillin-tazobactam (ZOSYN) IVPB 3.375 g     3.375 g 12.5 mL/hr over 240 Minutes Intravenous Every 8 hours 02/18/19 1652     02/05/19 1000  anidulafungin (ERAXIS) 100 mg in sodium chloride 0.9 % 100 mL IVPB     100 mg 78 mL/hr over 100 Minutes Intravenous Every 24 hours 02/04/19 0840     02/04/19 1000  anidulafungin (ERAXIS) 200 mg in sodium chloride 0.9 % 200 mL IVPB     200 mg 78 mL/hr over 200 Minutes Intravenous  Once 02/04/19 0840 02/04/19 1301   02/04/19 0200  fluconazole (DIFLUCAN) IVPB 200 mg  Status:   Discontinued     200 mg 100 mL/hr over 60 Minutes Intravenous Every 24 hours 02/02/19 2302 02/04/19 0840   02/03/19 1800  vancomycin (VANCOCIN) IVPB 750 mg/150 ml premix  Status:  Discontinued     750 mg 150 mL/hr over 60 Minutes Intravenous Every 24 hours 02/03/19 0241 02/03/19 0242   02/03/19 1800  vancomycin (VANCOCIN) IVPB 750 mg/150 ml premix  Status:  Discontinued     750 mg 150 mL/hr over 60 Minutes Intravenous Every 24 hours 02/03/19 0242 02/03/19 0858   02/03/19 0600  piperacillin-tazobactam (ZOSYN) IVPB 3.375 g     3.375 g 12.5 mL/hr over 240 Minutes Intravenous Every 8 hours 02/03/19 0243 02/17/19 0205   02/02/19 2315  fluconazole (DIFLUCAN) IVPB 400 mg     400 mg 100 mL/hr over 120 Minutes Intravenous STAT 02/02/19 2301 02/03/19 0218   02/02/19 1800  ceFEPIme (MAXIPIME) 2 g in sodium chloride 0.9 % 100 mL IVPB     2 g 200 mL/hr over 30 Minutes Intravenous  Once 02/02/19 1745 02/02/19 2030   02/02/19 1800  metroNIDAZOLE (FLAGYL) IVPB 500 mg     500 mg 100 mL/hr over 60 Minutes Intravenous  Once 02/02/19 1745 02/02/19 2237   02/02/19 1800  vancomycin (VANCOCIN) IVPB 1000 mg/200 mL premix  Status:  Discontinued     1,000 mg 200 mL/hr over 60 Minutes Intravenous  Once 02/02/19  1745 02/02/19 1747   02/02/19 1800  vancomycin (VANCOCIN) 1,250 mg in sodium chloride 0.9 % 250 mL IVPB     1,250 mg 166.7 mL/hr over 90 Minutes Intravenous  Once 02/02/19 1747 02/02/19 2237      Assessment/Plan: s/p Procedure(s): EXPLORATORY LAPAROTOMY WITH OVERSEW OF DUODENAL ULCER Continue bowel rest and tpn for nutrition support.  Pt with bile leak and cannot tolerate PO intake VDRF- extubated 11/29 Poorly controlled DM2- A1c15.2 S/p bilateral BKA Ischemic changes to both LE- defer to medicine and Dr. 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 No plans for sharp debridement at this time.  POD21,S/pex lap with oversew of duodenal ulcer,with Graham patch 11/23,Dr. Lucia Gaskins, for perforated prepyloric ulcer with gross contamination -now with apparent bile leak, although UGI negative but with limitations.CT scan also confirms leak but unable to tell where from. Small bowel appears flaccid and not healthy, but given significant high density material any specific details are difficult to conclude. - BID wet to dry dressing changes to midline abdominal wound - BID PPI; added carafate -pulm toilet and IS -OOB to chair as much as possible -mobilize, PT/OT -cont TNAgiven leak, will be unable to take in enteral nutrition for the foreseeable future -will contJP Drain to suction today to help with drainage of all the bile from her leak. Appreciate palliative care's assistance with this patient -defer further surgical intervention at this time as this will put her at higher risk for complications, etc.  Will cont with this management to control her leak and hopefully help her improve; however, she is not doing a whole lot and appears weak and frail.  Not sure she will do well overall.  ID - Eraxis 11/24>>Zosyn 11/23 >>12/6, 12/8 --> FEN -NPO/NGT VTE - NoSCDs,Bilateral BKa's - sq heparin  Foley -out Follow up -Dr. Lucia Gaskins  LOS: 21 days    Gina [name] [phone]

## 2019-02-24 LAB — CULTURE, BLOOD (ROUTINE X 2)
Culture: NO GROWTH
Culture: NO GROWTH
Special Requests: ADEQUATE
Special Requests: ADEQUATE

## 2019-02-24 LAB — COMPREHENSIVE METABOLIC PANEL
ALT: 21 U/L (ref 0–44)
AST: 26 U/L (ref 15–41)
Albumin: 1.2 g/dL — ABNORMAL LOW (ref 3.5–5.0)
Alkaline Phosphatase: 194 U/L — ABNORMAL HIGH (ref 38–126)
Anion gap: 8 (ref 5–15)
BUN: 42 mg/dL — ABNORMAL HIGH (ref 6–20)
CO2: 23 mmol/L (ref 22–32)
Calcium: 8.7 mg/dL — ABNORMAL LOW (ref 8.9–10.3)
Chloride: 108 mmol/L (ref 98–111)
Creatinine, Ser: 0.72 mg/dL (ref 0.44–1.00)
GFR calc Af Amer: >60 mL/min (ref >60)
GFR calc non Af Amer: >60 mL/min (ref >60)
Glucose, Bld: 129 mg/dL — ABNORMAL HIGH (ref 70–99)
Potassium: 4.6 mmol/L (ref 3.5–5.1)
Sodium: 139 mmol/L (ref 135–145)
Total Bilirubin: 0.2 mg/dL — ABNORMAL LOW (ref 0.3–1.2)
Total Protein: 6.4 g/dL — ABNORMAL LOW (ref 6.5–8.1)

## 2019-02-24 LAB — DIFFERENTIAL
Abs Immature Granulocytes: 1.2 10*3/uL — ABNORMAL HIGH (ref 0.00–0.07)
Band Neutrophils: 4 %
Basophils Absolute: 0.1 10*3/uL (ref 0.0–0.1)
Basophils Relative: 1 %
Eosinophils Absolute: 0 10*3/uL (ref 0.0–0.5)
Eosinophils Relative: 0 %
Lymphocytes Relative: 15 %
Lymphs Abs: 2 10*3/uL (ref 0.7–4.0)
Metamyelocytes Relative: 3 %
Monocytes Absolute: 0.5 10*3/uL (ref 0.1–1.0)
Monocytes Relative: 4 %
Myelocytes: 6 %
Neutro Abs: 9.4 10*3/uL — ABNORMAL HIGH (ref 1.7–7.7)
Neutrophils Relative %: 67 %

## 2019-02-24 LAB — GLUCOSE, CAPILLARY
Glucose-Capillary: 158 mg/dL — ABNORMAL HIGH (ref 70–99)
Glucose-Capillary: 160 mg/dL — ABNORMAL HIGH (ref 70–99)

## 2019-02-24 LAB — CBC
HCT: 23.7 % — ABNORMAL LOW (ref 36.0–46.0)
Hemoglobin: 7 g/dL — ABNORMAL LOW (ref 12.0–15.0)
MCH: 28.2 pg (ref 26.0–34.0)
MCHC: 29.5 g/dL — ABNORMAL LOW (ref 30.0–36.0)
MCV: 95.6 fL (ref 80.0–100.0)
Platelets: 534 10*3/uL — ABNORMAL HIGH (ref 150–400)
RBC: 2.48 MIL/uL — ABNORMAL LOW (ref 3.87–5.11)
RDW: 16.1 % — ABNORMAL HIGH (ref 11.5–15.5)
WBC: 13.2 10*3/uL — ABNORMAL HIGH (ref 4.0–10.5)
nRBC: 0.2 % (ref 0.0–0.2)

## 2019-02-24 LAB — MAGNESIUM: Magnesium: 2.2 mg/dL (ref 1.7–2.4)

## 2019-02-24 LAB — PREALBUMIN: Prealbumin: 7.7 mg/dL — ABNORMAL LOW (ref 18–38)

## 2019-02-24 LAB — PHOSPHORUS: Phosphorus: 3.7 mg/dL (ref 2.5–4.6)

## 2019-02-24 LAB — HEMOGLOBIN AND HEMATOCRIT, BLOOD
HCT: 23.2 % — ABNORMAL LOW (ref 36.0–46.0)
Hemoglobin: 7 g/dL — ABNORMAL LOW (ref 12.0–15.0)

## 2019-02-24 LAB — TRIGLYCERIDES: Triglycerides: 141 mg/dL (ref <150)

## 2019-02-24 MED ORDER — TRAVASOL 10 % IV SOLN
INTRAVENOUS | Status: AC
  Administered 2019-02-24: 17:00:00 via INTRAVENOUS
  Filled 2019-02-24: qty 1296

## 2019-02-24 NOTE — TOC Progression Note (Signed)
Transition of Care [facility]) - Progression Note    Patient Details  Name: Avaeh Gasque MRN: [id number] Date of Birth: 1961-02-28  Transition of Care [facility]) CM/SW Contact  Iman Orourke, Marjie Skiff, RN Phone Number: 02/24/2019, 4:02 PM  Clinical Narrative:    [facility] consult for LTACH. Choice of LTACH offered to daughter and [facility] chosen. [facility] to start auth with [facility]. TOC will continue to follow to see if Josem Kaufmann is granted.   Readmission Risk Interventions Readmission Risk Prevention Plan 02/10/2019 11/07/2017  Post Dischage Appt - Patient refused  Medication Screening - Complete  Transportation Screening Complete Complete  PCP follow-up - Patient refused  PCP or Specialist Appt within 3-5 Days Not Complete -  Not Complete comments DC date unknown- pt established with PCP -  [place] or Home Care Consult Complete -  Social Work Consult for [place] Planning/Counseling Not Complete -  SW consult not completed comments awaiting call back from daughter and APS -  Palliative Care Screening Not Complete -  Palliative Care Screening Not Complete Comments pending need -  Medication Review ([place]) Referral to Pharmacy -  Some recent data might be hidden

## 2019-02-24 NOTE — Progress Notes (Signed)
PROGRESS NOTE   Gina [name]   VZD:[id number]  DOB: Dec 30, 1960  DOA: 02/02/2019 PCP: [name], MD   Brief Narrative: PCCM transferred to [facility] 11/30 -The patient is a 58 year old female with severe protein calorie malnutrition, poorly controlled type 2 diabetes mellitus with bilateral foot ulcers, status post bilateral below-knee knee amputation, recent left phalanx amputation, schizophrenia, hypertension, severe protein calorie malnutrition was admitted to the ICU with septic shock , she was found to have a perforated gastric ulcer with pneumoperitoneum, also had evidence of DKA on admission -she was intubated, admitted to the ICU, treated with broad-spectrum antibiotics, pressors, aggressive fluid resuscitation, ultimately underwent ex lap on 11/23 which noted a perforated prepyloric ulcer which was oversewn and 3.1 L of contaminated peritoneal fluid was drained, she was subsequently extubated on 11/29. -She has had a extensive complicated hospital course notable for DKA, acute kidney injury, acute blood loss anemia, acute thrombocytopenia, intra-abdominal sepsis, bacteremia, fungemia. -General surgery continues to follow closely, NG tube was removed on 11/25, currently on TPN, during this hospital stay was also noted to have severe ischemic changes at her BKA site, vascular surgery as well as Dr. Sharol Given were consulted, they recommended consideration of above-knee amputation when patient is more stable -Palliative medicine team was consulted on 12/7  Subjective: Patient seen and examined early this afternoon. She continues to have high bile output from there JP drain and her NG tube. Tolerating TPN. In no distress.   Assessment & Plan:  Severe septic shock due to ruptured gastric ulcer with acute peritonitis, fungemia and UTI - ID consulted on 02/04/19 and following.  - Blood cultures from 11/22> Candida Glabrata- repeat blood culture 11/24 NGTD - Urine culture 11/22 > K pneumoniae -  Peritoneal fluid 11/23> Candida albicans - Opthalmology consulted - no evidence of fungal endophthalmitis  - currently receiving Andulafungin (scheduled end date for 12/14) and Zosyn - ID has recommended to remove central line to give line holiday and right IJ was removed on 12/1 - placed PICC 12/3- Gen surgery resumed TPN -02/15/2019: Sepsis physiology has resolved significantly.  Infectious disease team is managing fungemia and intra-abdominal infection, antifungal and antibiotics per their recommendations.  -General surgery following, no surgical interventions at this time.   Ischemic changes at bilateral BKA site -This is secondary to severe septic shock, required pressors.  -Seen by vascular surgery Dr. Scot Dock and orthopedics Dr. Sharol Given, they feel she will eventually require an above-knee amputation when she is more stable, patient declines and defers it at this time but would like to continue current medical management.    Acute kidney injury - improving - baseline Cr is < 1.0 - Cr rose to 2.66 on 11/26, likely from ATN,improved, back to baseline of <1 now.   ICU delirium -Likely secondary to all above infections, mental status improving slowly -Remains on as needed Haldol however has not required this in several days    DKA (diabetic ketoacidoses), severely uncontrolled with hyperglycemia  - resolved - A1c on 11/23>  15.2 -Continue Lantus and sliding scale, CBGs are stable now  Hypokalemia - replete as needed  Severe protein calorie malnutrition with hypoalbuminemia 02/15/2019: Patient still on TPN.  NG tube has been discontinued.  Currently on ice chips. -SLP is following and will assist in diet advancement.   Anemia due to acute blood loss, critical illness - Has required 2 units of PRBC this admission -Hgb staying in the 7-8 range. No active bleeding.  -Will repeat labs in am, transfuse if  Hgb <7. -Hgb of 7 this am, will repeat CBC in am.    Acute thrombocytopenia -  related to septic shock - nadir was > 31 on 11/27 - Platelets have risen back to normal   Elevated HCV antibody - 11/25 - HCV > 11.0  Left phalanx amputation  - on 12/28/18- Hand surgery contacted by general surgery on 11/30 to request suture removal.  Dr. Wynetta Emery spoke to Dr. Caralyn Guile 12/7 who said that RN can remove sutures and place picture in chart and he will review.   -Sutures were removed on 12/9 by RN with no complications.   S/p Bl BKA  Ethics: This is a seriously ill patient with the extremely sick and complicated hospital course with fungemia, intra-abdominal infection following perforation, severe protein calorie malnutrition, uncontrolled diabetes, ischemic changes in bilateral BKA requiring bilateral AKA, remains at risk of additional complications and decline -Palliative medicine team was consulted on 12/7 for goals of care, had meeting with family on 12/9 but no updates from them since then. Per family, they wanted more time to consider further [place] discussion.   12/14: Family receptive to LTAC. CSW consulted and actively working on LTAC placement.     Time spent in minutes: 25 min  DVT prophylaxis: heparin Code Status: Full code Family Communication: None at bedside Disposition Plan: Stay in stepdown  Consultants:   General surgery  ID  Palliative Care  Procedures:   Intubation/ Extubation  Ex lap Antimicrobials:  Anti-infectives (From admission, onward)   Start     Dose/Rate Route Frequency Ordered Stop   02/18/19 1800  piperacillin-tazobactam (ZOSYN) IVPB 3.375 g     3.375 g 12.5 mL/hr over 240 Minutes Intravenous Every 8 hours 02/18/19 1652     02/05/19 1000  anidulafungin (ERAXIS) 100 mg in sodium chloride 0.9 % 100 mL IVPB     100 mg 78 mL/hr over 100 Minutes Intravenous Every 24 hours 02/04/19 0840     02/04/19 1000  anidulafungin (ERAXIS) 200 mg in sodium chloride 0.9 % 200 mL IVPB     200 mg 78 mL/hr over 200 Minutes Intravenous  Once 02/04/19  0840 02/04/19 1301   02/04/19 0200  fluconazole (DIFLUCAN) IVPB 200 mg  Status:  Discontinued     200 mg 100 mL/hr over 60 Minutes Intravenous Every 24 hours 02/02/19 2302 02/04/19 0840   02/03/19 1800  vancomycin (VANCOCIN) IVPB 750 mg/150 ml premix  Status:  Discontinued     750 mg 150 mL/hr over 60 Minutes Intravenous Every 24 hours 02/03/19 0241 02/03/19 0242   02/03/19 1800  vancomycin (VANCOCIN) IVPB 750 mg/150 ml premix  Status:  Discontinued     750 mg 150 mL/hr over 60 Minutes Intravenous Every 24 hours 02/03/19 0242 02/03/19 0858   02/03/19 0600  piperacillin-tazobactam (ZOSYN) IVPB 3.375 g     3.375 g 12.5 mL/hr over 240 Minutes Intravenous Every 8 hours 02/03/19 0243 02/17/19 0205   02/02/19 2315  fluconazole (DIFLUCAN) IVPB 400 mg     400 mg 100 mL/hr over 120 Minutes Intravenous STAT 02/02/19 2301 02/03/19 0218   02/02/19 1800  ceFEPIme (MAXIPIME) 2 g in sodium chloride 0.9 % 100 mL IVPB     2 g 200 mL/hr over 30 Minutes Intravenous  Once 02/02/19 1745 02/02/19 2030   02/02/19 1800  metroNIDAZOLE (FLAGYL) IVPB 500 mg     500 mg 100 mL/hr over 60 Minutes Intravenous  Once 02/02/19 1745 02/02/19 2237   02/02/19 1800  vancomycin (VANCOCIN) IVPB  1000 mg/200 mL premix  Status:  Discontinued     1,000 mg 200 mL/hr over 60 Minutes Intravenous  Once 02/02/19 1745 02/02/19 1747   02/02/19 1800  vancomycin (VANCOCIN) 1,250 mg in sodium chloride 0.9 % 250 mL IVPB     1,250 mg 166.7 mL/hr over 90 Minutes Intravenous  Once 02/02/19 1747 02/02/19 2237      Objective: Vitals:   02/24/19 1400 02/24/19 1552 02/24/19 1600 02/24/19 1800  BP: 131/61  (!) 138/57 (!) 128/54  Pulse: 79  85 80  Resp: 16  12 13   Temp:  98 F (36.7 C)    TempSrc:  Axillary    SpO2:      Weight:      Height:        Intake/Output Summary (Last 24 hours) at 02/24/2019 1954 Last data filed at 02/24/2019 1904 Gross per 24 hour  Intake 2095.64 ml  Output 2350 ml  Net -254.36 ml   Filed Weights    02/22/19 0500 02/23/19 0500 02/24/19 0500  Weight: 57.3 kg 57.2 kg 54.7 kg    Examination: Gen: Extremely cachectic chronically ill-appearing female laying in bed awake alert, answers "yes" or "no" to questions, no distress. Calm.   HEENT: PERRLA, Neck supple, no JVD. NG tube in place.  Lungs: No respiratory distress, no wheezing.  CVS: RRR Abd: Soft, mild diffuse tenderness, mildly distended. JP drain in place with bile.  Extremities: Bilateral BKA with ischemic changes skin: Ischemic changes, discolorations noted in both lower legs near BKA site. Bilateral lower extremities covered with bandages that are c/d/i.   Flexi-Seal noted with stool content.  Data Reviewed: I have personally reviewed following labs and imaging studies  CBC: Recent Labs  Lab 02/18/19 0155 02/20/19 0254 02/20/19 1858 02/21/19 1110 02/22/19 0203 02/24/19 0206 02/24/19 1044  WBC 6.7 9.7  --  12.3* 11.6* 13.2*  --   NEUTROABS 4.5  --   --   --   --  9.4*  --   HGB 7.8* 7.0* 7.4* 7.1* 8.1* 7.0* 7.0*  HCT 25.3* 23.0* 23.5* 22.7* 26.5* 23.7* 23.2*  MCV 95.1 93.9  --  93.8 94.3 95.6  --   PLT 322 356  --  397 376 534*  --    Basic Metabolic Panel: Recent Labs  Lab 02/19/19 0427 02/20/19 0254 02/21/19 1110 02/22/19 0203 02/23/19 1040 02/24/19 0206  NA 134* 135 137 137 138 139  K 4.9 4.6 4.6 4.3 4.5 4.6  CL 107 106 108 108 108 108  CO2 21* 21* 23 20* 25 23  GLUCOSE 142* 146* 166* 162* 118* 129*  BUN 36* 38* 40* 36* 43* 42*  CREATININE 0.99 0.93 0.84 0.84 0.72 0.72  CALCIUM 8.2* 8.4* 8.3* 8.7* 8.6* 8.7*  MG 2.1 2.2  --  2.0 2.3 2.2  PHOS 3.9 3.8  --  3.2 3.4 3.7   GFR: Estimated Creatinine Clearance: 61.4 mL/min (by C-G formula based on SCr of 0.72 mg/dL). Liver Function Tests: Recent Labs  Lab 02/20/19 0254 02/21/19 1110 02/22/19 0203 02/24/19 0206  AST 15 16 18 26   ALT 8 10 12 21   ALKPHOS 72 104 142* 194*  BILITOT 0.4 0.5 0.3 0.2*  PROT 5.8* 6.0* 6.0* 6.4*  ALBUMIN 1.0* 1.1* 1.1*  1.2*   No results for input(s): LIPASE, AMYLASE in the last 168 hours. No results for input(s): AMMONIA in the last 168 hours. Coagulation Profile: No results for input(s): INR, PROTIME in the last 168 hours. Cardiac Enzymes: No  results for input(s): CKTOTAL, CKMB, CKMBINDEX, TROPONINI in the last 168 hours. BNP (last 3 results) No results for input(s): PROBNP in the last 8760 hours. HbA1C: No results for input(s): HGBA1C in the last 72 hours. CBG: Recent Labs  Lab 02/23/19 1126 02/23/19 1744 02/23/19 2308 02/24/19 0623 02/24/19 1158  GLUCAP 117* 152* 110* 158* 160*   Lipid Profile: Recent Labs    02/24/19 0206  TRIG 141   Thyroid Function Tests: No results for input(s): TSH, T4TOTAL, FREET4, T3FREE, THYROIDAB in the last 72 hours. Anemia Panel: No results for input(s): VITAMINB12, FOLATE, FERRITIN, TIBC, IRON, RETICCTPCT in the last 72 hours. Urine analysis:    Component Value Date/Time   COLORURINE YELLOW 02/12/2019 1401   APPEARANCEUR CLOUDY (A) 02/12/2019 1401   LABSPEC 1.010 02/12/2019 1401   PHURINE 5.0 02/12/2019 1401   GLUCOSEU >=500 (A) 02/12/2019 1401   HGBUR MODERATE (A) 02/12/2019 1401   BILIRUBINUR NEGATIVE 02/12/2019 1401   BILIRUBINUR N 04/28/2016 1603   KETONESUR NEGATIVE 02/12/2019 1401   PROTEINUR 30 (A) 02/12/2019 1401   UROBILINOGEN 0.2 04/28/2016 1603   UROBILINOGEN 0.2 06/18/2013 0650   NITRITE NEGATIVE 02/12/2019 1401   LEUKOCYTESUR NEGATIVE 02/12/2019 1401   Recent Results (from the past 240 hour(s))  Culture, blood (routine x 2)     Status: None   Collection Time: 02/18/19  5:54 PM   Specimen: BLOOD LEFT ARM  Result Value Ref Range Status   Specimen Description   Final    BLOOD LEFT ARM Performed at [facility], [place] [address], [place], [place] [address]    Special Requests   Final    BOTTLES DRAWN AEROBIC AND ANAEROBIC Blood Culture adequate volume Performed at [place] [address] [place], [place] [address]    Culture   Final    NO GROWTH 5 DAYS Performed at [facility], [place] [address] [place], [name] [address]    Report Status 02/24/2019 FINAL  Final  Culture, blood (routine x 2)     Status: None   Collection Time: 02/18/19  6:07 PM   Specimen: BLOOD LEFT ARM  Result Value Ref Range Status   Specimen Description   Final    BLOOD LEFT ARM Performed at [place] [address], [place], [place] [address]    Special Requests   Final    BOTTLES DRAWN AEROBIC AND ANAEROBIC Blood Culture adequate volume Performed at [place] [address] [place], [place] [address]    Culture   Final    NO GROWTH 5 DAYS Performed at [facility], [place] [address], [place], [place] [address]    Report Status 02/24/2019 FINAL  Final    Radiology Studies: No results found.  Scheduled Meds: . chlorhexidine gluconate (MEDLINE KIT)  15 mL Mouth Rinse BID  . Chlorhexidine Gluconate Cloth  6 each Topical Daily  . collagenase   Topical Daily  . Gerhardt's butt cream   Topical TID  . heparin injection (subcutaneous)  5,000 Units Subcutaneous Q8H  . insulin aspart  0-20 Units Subcutaneous Q6H  . metoprolol tartrate  2.5 mg Intravenous Q6H  . pantoprazole (PROTONIX) IV  40 mg Intravenous Q12H  . sodium chloride flush  10-40 mL Intracatheter Q12H   Continuous Infusions: . sodium chloride 10 mL/hr at 02/18/19 1833  . sodium chloride Stopped (02/13/19 2137)  . anidulafungin Stopped (02/24/19 1432)  . piperacillin-tazobactam (ZOSYN)  IV 3.375 g (02/24/19 1904)  .  TPN ADULT (ION) 90 mL/hr at 02/24/19 1721     LOS: 22 days   Time spent: 3mn SBlain Pais MD  02/24/2019, 7:54 PM

## 2019-02-24 NOTE — Progress Notes (Signed)
PHARMACY - TOTAL PARENTERAL NUTRITION CONSULT NOTE   Indication: Small bowel obstruction  Patient Measurements: Height: _0  (157.5 cm) Weight: 120 lb 9.5 oz (54.7 kg) IBW/kg (Calculated) : 50.1 TPN AdjBW (KG): 57.9 Body mass index is 22.06 kg/m. Usual Weight: 55-60 kg Ideal Body Weight:  44.2 kg(Adjusted IBW for bilateral BKA)  Assessment:  [name] with poorly controlled DM s/p BL BKA, admitted 11/23 with AMS, n/v, abdominal pain. CT abdomen showed pneumoperitoneum and mesenteric ischemia. Underwent ex lap on 11/23 with finding of perforated prepyloric ulcer which was oversewn, along with gross contamination of the peritoneum. Hospital course complicated by DKA, AKI, acute thrombocytopenia, and C. glabrata candidemia. Patient remains with high NGT d/t bowel edema as demonstrated on UGI. TPN started on 11/30 but was stopped shortly thereafter to facilitate central line holiday for candidemia. Repeat blood cultures remain negative to date, and TPN now restarting with replacement of central line. Patient at risk for refeeding given prolonged lack of nutrition.  Glucose / Insulin: Hx DM; prev required D10 W infusion to maintain euglycemia  Current resistant Novolog scale q6h (12/5)   CBGs ( goal <150)  range 110-158, 8 units of Novolog  past 24 hrs, 20 units of regular insulin added to TPN  Electrolytes: WNL, corrected Ca 10.94 Renal: AKI on admit, SCr 0.84  LFTs / TGs: WNL (11/30), 126 (12/4), 116 ( 12/7) , Alk phos up to 194, Trig 141 12/14 Prealbumin / albumin: both low (11/30), 5.2 (12/4). 5.3 ( 12/7)  7.7 (12/14),  I/O: 900 ml from JP drain,  1650 ml UOP,  350 ml NGO, 50 ml stool MIVF: 1/2 NS at 10 ml/hr GI Imaging: 11/30 UGI: high NG output & bowel edema Surgeries / Procedures: 11/22 ex lap: oversew of duodenal ulcer with [name] patch  Central access: PICC replaced 12/3  TPN start date: resumed 12/3 (~ 2200)  Nutritional Goals (per RD recommendation on 12/11): Kcal:  2025-2330  kcal Protein:  115-130 grams Fluid:  >/= 2 L/day Goal TPN rate is 90 mL/hr (provides 129 g of protein and 2047 kcals per day) - GIR 3.3 mg/kg/min Current Nutrition:  NPO Plan: at 1800  Continue custom TPN at 90 mL/hr, the target rate   Electrolytes in TPN:  52mq/L Na, 25 mEq/L K, 3 mEq/L Ca, Magnesium 10 mEq/L , and 10 mmol/L of Phos ; continue Cl:Ac ratio 1:2  Standard MVI and trace elements in TPN on MWF only d/t national shortage  Continue SSI to resistant with q6h CBG checks and adjust as needed   Continue 20 units of Novolog to TPN  [facility]   Monitor TPN labs on Mon/Thurs  MEudelia Bunch Pharm.D [phone] 02/24/2019 7:31 AM

## 2019-02-24 NOTE — Progress Notes (Signed)
Patient ID: Gina [name], female   DOB: 08/27/1960, 58 y.o.   MRN: [id number]    22 Days Post-Op  Subjective: No new issues today.  Patient denies abdominal pain.  She is oriented today, but just lays in bed.  ROS: See above, otherwise other systems negative  Objective: Vital signs in last 24 hours: Temp:  [97.5 F (36.4 C)-100.2 F (37.9 C)] 97.6 F (36.4 C) (12/14 0654) Pulse Rate:  [76-95] 80 (12/14 0840) Resp:  [12-21] 15 (12/14 0840) BP: (108-167)/(52-78) 142/57 (12/14 0805) SpO2:  [100 %] 100 % (12/14 0400) Weight:  [54.7 kg] 54.7 kg (12/14 0500) Last BM Date: 02/23/19  Intake/Output from previous day: 12/13 0701 - 12/14 0700 In: 2249.3 [I.V.:1983.4; IV Piggyback:265.9] Out: 2950 [Urine:1650; Emesis/NG output:350; Drains:900; Stool:50] Intake/Output this shift: No intake/output data recorded.  PE: Abd: soft, NT, ND, midline incision is clean and packed with good granulation tissue.  Surgical drain to suction with bilious output, 900cc.  NGT with 350cc of bilious output.   Lab Results:  Recent Labs    02/22/19 0203 02/24/19 0206  WBC 11.6* 13.2*  HGB 8.1* 7.0*  HCT 26.5* 23.7*  PLT 376 534*   BMET Recent Labs    02/23/19 1040 02/24/19 0206  NA 138 139  K 4.5 4.6  CL 108 108  CO2 25 23  GLUCOSE 118* 129*  BUN 43* 42*  CREATININE 0.72 0.72  CALCIUM 8.6* 8.7*   PT/INR No results for input(s): LABPROT, INR in the last 72 hours. CMP     Component Value Date/Time   NA 139 02/24/2019 0206   NA 139 10/04/2017 1705   K 4.6 02/24/2019 0206   CL 108 02/24/2019 0206   CO2 23 02/24/2019 0206   GLUCOSE 129 (H) 02/24/2019 0206   BUN 42 (H) 02/24/2019 0206   BUN 11 10/04/2017 1705   CREATININE 0.72 02/24/2019 0206   CREATININE 0.68 04/28/2016 1640   CALCIUM 8.7 (L) 02/24/2019 0206   PROT 6.4 (L) 02/24/2019 0206   PROT 7.1 10/04/2017 1705   ALBUMIN 1.2 (L) 02/24/2019 0206   ALBUMIN 3.7 10/04/2017 1705   AST 26 02/24/2019 0206   ALT 21 02/24/2019 0206     ALKPHOS 194 (H) 02/24/2019 0206   BILITOT 0.2 (L) 02/24/2019 0206   BILITOT <0.2 10/04/2017 1705   GFRNONAA >60 02/24/2019 0206   GFRNONAA >89 04/28/2016 1640   GFRAA >60 02/24/2019 0206   GFRAA >89 04/28/2016 1640   Lipase     Component Value Date/Time   LIPASE 27 04/01/2016 1040       Studies/Results: No results found.  Anti-infectives: Anti-infectives (From admission, onward)   Start     Dose/Rate Route Frequency Ordered Stop   02/18/19 1800  piperacillin-tazobactam (ZOSYN) IVPB 3.375 g     3.375 g 12.5 mL/hr over 240 Minutes Intravenous Every 8 hours 02/18/19 1652     02/05/19 1000  anidulafungin (ERAXIS) 100 mg in sodium chloride 0.9 % 100 mL IVPB     100 mg 78 mL/hr over 100 Minutes Intravenous Every 24 hours 02/04/19 0840     02/04/19 1000  anidulafungin (ERAXIS) 200 mg in sodium chloride 0.9 % 200 mL IVPB     200 mg 78 mL/hr over 200 Minutes Intravenous  Once 02/04/19 0840 02/04/19 1301   02/04/19 0200  fluconazole (DIFLUCAN) IVPB 200 mg  Status:  Discontinued     200 mg 100 mL/hr over 60 Minutes Intravenous Every 24 hours 02/02/19 2302 02/04/19 0840  02/03/19 1800  vancomycin (VANCOCIN) IVPB 750 mg/150 ml premix  Status:  Discontinued     750 mg 150 mL/hr over 60 Minutes Intravenous Every 24 hours 02/03/19 0241 02/03/19 0242   02/03/19 1800  vancomycin (VANCOCIN) IVPB 750 mg/150 ml premix  Status:  Discontinued     750 mg 150 mL/hr over 60 Minutes Intravenous Every 24 hours 02/03/19 0242 02/03/19 0858   02/03/19 0600  piperacillin-tazobactam (ZOSYN) IVPB 3.375 g     3.375 g 12.5 mL/hr over 240 Minutes Intravenous Every 8 hours 02/03/19 0243 02/17/19 0205   02/02/19 2315  fluconazole (DIFLUCAN) IVPB 400 mg     400 mg 100 mL/hr over 120 Minutes Intravenous STAT 02/02/19 2301 02/03/19 0218   02/02/19 1800  ceFEPIme (MAXIPIME) 2 g in sodium chloride 0.9 % 100 mL IVPB     2 g 200 mL/hr over 30 Minutes Intravenous  Once 02/02/19 1745 02/02/19 2030   02/02/19  1800  metroNIDAZOLE (FLAGYL) IVPB 500 mg     500 mg 100 mL/hr over 60 Minutes Intravenous  Once 02/02/19 1745 02/02/19 2237   02/02/19 1800  vancomycin (VANCOCIN) IVPB 1000 mg/200 mL premix  Status:  Discontinued     1,000 mg 200 mL/hr over 60 Minutes Intravenous  Once 02/02/19 1745 02/02/19 1747   02/02/19 1800  vancomycin (VANCOCIN) 1,250 mg in sodium chloride 0.9 % 250 mL IVPB     1,250 mg 166.7 mL/hr over 90 Minutes Intravenous  Once 02/02/19 1747 02/02/19 2237       Assessment/Plan VDRF- extubated 11/29 Poorly controlled DM2- A1c15.2 S/p bilateral BKA Ischemic changes to both LE- defer to medicine and Dr. 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 No plans for sharp debridement at this time.  POD22,S/pex lap with oversew of duodenal ulcer,with Graham patch 11/23,Dr. Lucia Gaskins, for perforated prepyloric ulcer with gross contamination -now with apparent bile leak, although UGI negative but with limitations.CT scan also confirms leak but unable to tell where from. Small bowel appears flaccid and not healthy, but given significant high density material any specific details are difficult to conclude. - BID wet to dry dressing changes to midline abdominal wound - BID PPI -pulm toilet and IS -OOB to chair as much as possible -mobilize, PT/OT -cont TNAgiven leak, will be unable to take in enteral nutrition for the foreseeable future -will contJP Drain to suction today to help with drainage of all the bile from her leak. Appreciate palliative care's assistance with this patient -defer further  surgical intervention at this time as this will put her at higher risk for complications, etc. Will cont with this management to control her leak and hopefully help her improve; however, she is not doing a whole lot and appears weak and frail. Not sure she will do well overall. -repeat CT scan tomorrow to further review image findings from last week now that all of that contrast has had a chance to progress.  ID - Eraxis 11/24>>Zosyn 11/23 >>12/6, 12/8 --> FEN -NPO/NGT VTE - NoSCDs,Bilateral BKa's - sq heparin  Foley -out Follow up -Dr. Lucia Gaskins   LOS: 22 days    Henreitta Cea , [facility] 02/24/2019, 10:36  AM Please see Amion for pager number during day hours 7:00am-4:30pm

## 2019-02-25 ENCOUNTER — Inpatient Hospital Stay (HOSPITAL_COMMUNITY): Payer: Medicare HMO

## 2019-02-25 ENCOUNTER — Encounter (HOSPITAL_COMMUNITY): Payer: Self-pay | Admitting: Internal Medicine

## 2019-02-25 LAB — COMPREHENSIVE METABOLIC PANEL
ALT: 19 U/L (ref 0–44)
AST: 22 U/L (ref 15–41)
Albumin: 1.2 g/dL — ABNORMAL LOW (ref 3.5–5.0)
Alkaline Phosphatase: 206 U/L — ABNORMAL HIGH (ref 38–126)
Anion gap: 6 (ref 5–15)
BUN: 44 mg/dL — ABNORMAL HIGH (ref 6–20)
CO2: 25 mmol/L (ref 22–32)
Calcium: 8.7 mg/dL — ABNORMAL LOW (ref 8.9–10.3)
Chloride: 108 mmol/L (ref 98–111)
Creatinine, Ser: 0.67 mg/dL (ref 0.44–1.00)
GFR calc Af Amer: >60 mL/min (ref >60)
GFR calc non Af Amer: >60 mL/min (ref >60)
Glucose, Bld: 137 mg/dL — ABNORMAL HIGH (ref 70–99)
Potassium: 4.6 mmol/L (ref 3.5–5.1)
Sodium: 139 mmol/L (ref 135–145)
Total Bilirubin: 0.5 mg/dL (ref 0.3–1.2)
Total Protein: 6.4 g/dL — ABNORMAL LOW (ref 6.5–8.1)

## 2019-02-25 LAB — CBC
HCT: 22.7 % — ABNORMAL LOW (ref 36.0–46.0)
Hemoglobin: 7 g/dL — ABNORMAL LOW (ref 12.0–15.0)
MCH: 29.4 pg (ref 26.0–34.0)
MCHC: 30.8 g/dL (ref 30.0–36.0)
MCV: 95.4 fL (ref 80.0–100.0)
Platelets: 540 10*3/uL — ABNORMAL HIGH (ref 150–400)
RBC: 2.38 MIL/uL — ABNORMAL LOW (ref 3.87–5.11)
RDW: 16.1 % — ABNORMAL HIGH (ref 11.5–15.5)
WBC: 12.4 10*3/uL — ABNORMAL HIGH (ref 4.0–10.5)
nRBC: 0.2 % (ref 0.0–0.2)

## 2019-02-25 LAB — GLUCOSE, CAPILLARY
Glucose-Capillary: 115 mg/dL — ABNORMAL HIGH (ref 70–99)
Glucose-Capillary: 119 mg/dL — ABNORMAL HIGH (ref 70–99)
Glucose-Capillary: 127 mg/dL — ABNORMAL HIGH (ref 70–99)
Glucose-Capillary: 143 mg/dL — ABNORMAL HIGH (ref 70–99)
Glucose-Capillary: 174 mg/dL — ABNORMAL HIGH (ref 70–99)

## 2019-02-25 MED ORDER — IOHEXOL 9 MG/ML PO SOLN
ORAL | Status: AC
  Filled 2019-02-25: qty 1000

## 2019-02-25 MED ORDER — TRAVASOL 10 % IV SOLN
INTRAVENOUS | Status: AC
  Filled 2019-02-25: qty 1296

## 2019-02-25 MED ORDER — IOHEXOL 9 MG/ML PO SOLN
500.0000 mL | ORAL | Status: AC
  Administered 2019-02-25 (×2): 500 mL via ORAL

## 2019-02-25 MED ORDER — IOHEXOL 300 MG/ML  SOLN
100.0000 mL | Freq: Once | INTRAMUSCULAR | Status: AC | PRN
  Administered 2019-02-25: 100 mL via INTRAVENOUS

## 2019-02-25 MED ORDER — SODIUM CHLORIDE (PF) 0.9 % IJ SOLN
INTRAMUSCULAR | Status: AC
  Filled 2019-02-25: qty 50

## 2019-02-25 NOTE — Progress Notes (Signed)
PHARMACY - TOTAL PARENTERAL NUTRITION CONSULT NOTE   Indication: Small bowel obstruction  Patient Measurements: Height: _0  (157.5 cm) Weight: 120 lb 9.5 oz (54.7 kg) IBW/kg (Calculated) : 50.1 TPN AdjBW (KG): 57.9 Body mass index is 22.06 kg/m. Usual Weight: 55-60 kg Ideal Body Weight:  44.2 kg(Adjusted IBW for bilateral BKA)  Assessment:  40 yoF with poorly controlled DM s/p BL BKA, admitted 11/23 with AMS, n/v, abdominal pain. CT abdomen showed pneumoperitoneum and mesenteric ischemia. Underwent ex lap on 11/23 with finding of perforated prepyloric ulcer which was oversewn, along with gross contamination of the peritoneum. Hospital course complicated by DKA, AKI, acute thrombocytopenia, and C. glabrata candidemia. Patient remains with high NGT d/t bowel edema as demonstrated on UGI. TPN started on 11/30 but was stopped shortly thereafter to facilitate central line holiday for candidemia. Repeat blood cultures remain negative to date, and TPN now restarting with replacement of central line. Patient at risk for refeeding given prolonged lack of nutrition.  Glucose / Insulin: Hx DM; prev required D10 W infusion to maintain euglycemia  Current resistant Novolog scale q6h (12/5)   CBGs ( goal <150)  range 115-174, 11 units of Novolog  past 24 hrs, 20 units of regular insulin added to TPN  Electrolytes: WNL, corrected Ca 10.94 Renal: AKI on admit, SCr WNL  LFTs / TGs: Alk phos up to 206, Trig 141(22/14) Prealbumin / albumin: both low (11/30), 5.2 (12/4). 5.3 ( 12/7)  7.7 (12/14),  I/O: 800 ml from JP drain,  750 ml UOP,  400 ml NGO, 0 ml stool MIVF: 1/2 NS at 10 ml/hr GI Imaging: 11/30 UGI: high NG output & bowel edema Surgeries / Procedures: 11/22 ex lap: oversew of duodenal ulcer with [name] patch  Central access: PICC replaced 12/3  TPN start date: resumed 12/3 (~ 2200)  Nutritional Goals (per RD recommendation on 12/11): Kcal:  2025-2330 kcal Protein:  115-130 grams Fluid:   >/= 2 L/day Goal TPN rate is 90 mL/hr (provides 129 g of protein and 2047 kcals per day) - GIR 3.3 mg/kg/min Current Nutrition:  NPO Plan: at 1800  Continue custom TPN at 90 mL/hr, the target rate   Electrolytes in TPN:  54mq/L Na, 25 mEq/L K, 3 mEq/L Ca, Magnesium 10 mEq/L , and 10 mmol/L of Phos ; continue Cl:Ac ratio 1:2  Standard MVI and trace elements in TPN on MWF only d/t national shortage  Continue SSI to resistant with q6h CBG checks and adjust as needed   Continue 20 units of Novolog to TPN  [facility]   Monitor TPN labs on Mon/Thurs  MEudelia Bunch Pharm.D [phone] 02/25/2019 7:35 AM

## 2019-02-25 NOTE — Progress Notes (Signed)
PT Cancellation Note  Patient Details Name: Gina [name] MRN: [id number] DOB: 1960/03/20   Cancelled Treatment:    Reason Eval/Treat Not Completed: Pain limiting ability to participate -  Pt states her stomach feels full and painful due to contrast fluid pre-CT scan. PT to check back as schedule allows.  [place] Pager [phone]  Office [phone]    Roxine Caddy D Elonda Husky 02/25/2019, 12:29 PM

## 2019-02-25 NOTE — Progress Notes (Addendum)
[facility] for Infectious Disease    Date of Admission:  02/02/2019     ID: Gina [name] is a 58 y.o. female who is POD23,S/pex lap with oversew of duodenal Everlean Alstrom patch 11/23,for perforated prepyloric ulcer with gross contamination. Has evidence still of bile leak. On broad spectrum and treated for c.glabrata fungemia  Principal Problem:   Septic shock ([place]) Active Problems:   DKA (diabetic ketoacidoses) (HCC)   Severe protein-calorie malnutrition ([place])   Acute kidney injury ([place])   Pressure injury of skin   Perforated gastric ulcer ([place])   Acute respiratory failure (HCC)   Duodenal perforation (HCC)   Below-knee amputee ([place])   Atherosclerosis of native arteries of extremities with gangrene, right leg (HCC)   Atherosclerosis of native arteries of extremities with gangrene, left leg (HCC)    Subjective: Afebrile, underwent repeat CT  Medications:  . chlorhexidine gluconate (MEDLINE KIT)  15 mL Mouth Rinse BID  . Chlorhexidine Gluconate Cloth  6 each Topical Daily  . collagenase   Topical Daily  . Gerhardt's butt cream   Topical TID  . heparin injection (subcutaneous)  5,000 Units Subcutaneous Q8H  . insulin aspart  0-20 Units Subcutaneous Q6H  . iohexol      . metoprolol tartrate  2.5 mg Intravenous Q6H  . pantoprazole (PROTONIX) IV  40 mg Intravenous Q12H  . sodium chloride (PF)      . sodium chloride flush  10-40 mL Intracatheter Q12H    Objective: Vital signs in last 24 hours: Temp:  [97.3 F (36.3 C)-98.3 F (36.8 C)] 97.3 F (36.3 C) (12/15 1500) Pulse Rate:  [80-98] 84 (12/15 1200) Resp:  [13-22] 17 (12/15 1200) BP: (112-154)/(48-68) 154/68 (12/15 1200) SpO2:  [100 %] 100 % (12/15 0550) Weight:  [54.7 kg] 54.7 kg (12/15 0500)  Physical Exam  Constitutional:  oriented to person, place, and time. appears well-developed and well-nourished. No distress.  HENT: [place]/AT, PERRLA, no scleral icterus Mouth/Throat: Oropharynx is clear and  moist. No oropharyngeal exudate.  Cardiovascular: Normal rate, regular rhythm and normal heart sounds. Exam reveals no gallop and no friction rub.  No murmur heard.  Pulmonary/Chest: Effort normal and breath sounds normal. No respiratory distress.  has no wheezes.  Neck = supple, no nuchal rigidity Abdominal: Soft. Bowel sounds are decreased. Midline incision c/d/i. Ext: wounds to bilateral bka Skin: Skin is warm and dry. No rash noted. No erythema.  Psychiatric: a normal mood and affect.  behavior is normal.    Lab Results Recent Labs    02/24/19 0206 02/24/19 1044 02/25/19 0547  WBC 13.2*  --  12.4*  HGB 7.0* 7.0* 7.0*  HCT 23.7* 23.2* 22.7*  NA 139  --  139  K 4.6  --  4.6  CL 108  --  108  CO2 23  --  25  BUN 42*  --  44*  CREATININE 0.72  --  0.67   Liver Panel Recent Labs    02/24/19 0206 02/25/19 0547  PROT 6.4* 6.4*  ALBUMIN 1.2* 1.2*  AST 26 22  ALT 21 19  ALKPHOS 194* 206*  BILITOT 0.2* 0.5    Microbiology: reviewed Studies/Results: No results found.   Assessment/Plan: Intra-abdominal infection, with ongoing bile leak = continue with piptazo, recommend to continue for 2 additional week. AFter 2 wks, can switch to orals amox/clav for addn 2 weeks.  Defer to surgery on how to manage CT findings   Fungemia = completed course. Will stop anidulafungin  Leukocytosis, mild =  likely related from intra-abdominal process. Awaiting abd CT read  Addendum: CT read listed below, still persistently large abscess somewhat smaller than previous images There is a persistent fluid collection about the ventral abdomen, which is decreased in size compared to prior examination and partially contrast opacified, suspect persistent leak. This collection measures 14.0 x 3.2 x 14.5 cm, previously 19.6 x 4.2 x 15.2 cm when measured similarly (series 3, image 41, 55, series 7, image 87).  Will plan to see in clinic in roughly 2-3 wk. Will sign off.  [facility] for Infectious Diseases Cell: [phone] Pager: [phone]  02/25/2019, 4:05 PM

## 2019-02-25 NOTE — Progress Notes (Signed)
Physical Therapy Treatment Patient Details Name: Gina [name] MRN: [id number] DOB: May 10, 1960 Today's Date: 02/25/2019    History of Present Illness 58 yo female admitted with septic shock, perforated ulcer. S/P ex lap, oversewing of duodenal ulcer 11/23. Extubated 11/29. Hx of bil BKA (2019/2020), DM, schizophrenia    PT Comments    Pt agreeable to EOB balance training, and tolerated lateral leaning and LE exercise while sitting EOB well. VSS during session, pt mostly limited by sacral pain. PT to continue to progress pt mobility as able.    Follow Up Recommendations  SNF     Equipment Recommendations  None recommended by PT    Recommendations for Other Services       Precautions / Restrictions Precautions Precautions: Fall Precaution Comments: bil BKA, NG tube, multiple lines/leads, NPO, R jp drain to suction Restrictions Weight Bearing Restrictions: No    Mobility  Bed Mobility Overal bed mobility: Needs Assistance Bed Mobility: Rolling;Sidelying to Sit;Sit to Supine Rolling: Mod assist Sidelying to sit: Max assist;+2 for physical assistance;+2 for safety/equipment;HOB elevated   Sit to supine: Max assist;+2 for safety/equipment;+2 for physical assistance;HOB elevated   General bed mobility comments: max assist +2 for supine to sit for trunk elevation, LE lifting, and lifting body weight to offload buttocks region due to pt pain  Transfers                 General transfer comment: NT - pt too fatigued following CT  Ambulation/Gait                 Stairs             Wheelchair Mobility    Modified Rankin (Stroke Patients Only)       Balance Overall balance assessment: Needs assistance Sitting-balance support: Bilateral upper extremity supported Sitting balance-Leahy Scale: Fair Sitting balance - Comments: EOB ~10 minutes, lateral leaning repetitions bilaterally (propping on elbow to elbow extension) to work on dynamic sitting  balance Postural control: Posterior lean                                  Cognition Arousal/Alertness: Awake/alert Behavior During Therapy: WFL for tasks assessed/performed Overall Cognitive Status: No family/caregiver present to determine baseline cognitive functioning Area of Impairment: Problem solving;Memory;Following commands                     Memory: Decreased short-term memory Following Commands: Follows one step commands with increased time     Problem Solving: Slow processing;Requires verbal cues;Requires tactile cues        Exercises General Exercises - Lower Extremity Long Arc Quad: AROM;Both;5 reps;Seated    General Comments        Pertinent Vitals/Pain Pain Assessment: Faces Faces Pain Scale: Hurts even more Pain Location: buttocks Pain Descriptors / Indicators: Sore Pain Intervention(s): Limited activity within patient's tolerance;Monitored during session;Premedicated before session;Repositioned    Home Living                      Prior Function            PT Goals (current goals can now be found in the care plan section) Acute Rehab PT Goals Patient Stated Goal: none stated PT Goal Formulation: With patient Time For Goal Achievement: 03/03/19 Potential to Achieve Goals: Fair    Frequency    Min 2X/week      PT Plan  Current plan remains appropriate    Co-evaluation              AM-PAC PT "6 Clicks" Mobility   Outcome Measure  Help needed turning from your back to your side while in a flat bed without using bedrails?: A Lot Help needed moving from lying on your back to sitting on the side of a flat bed without using bedrails?: A Lot Help needed moving to and from a bed to a chair (including a wheelchair)?: Total Help needed standing up from a chair using your arms (e.g., wheelchair or bedside chair)?: Total Help needed to walk in hospital room?: Total Help needed climbing 3-5 steps with a railing? :  Total 6 Click Score: 8    End of Session   Activity Tolerance: Patient limited by fatigue Patient left: with call bell/phone within reach;in bed;with bed alarm set Nurse Communication: Mobility status PT Visit Diagnosis: Muscle weakness (generalized) (M62.81);Other abnormalities of gait and mobility (R26.89)     Time: [id number] PT Time Calculation (min) (ACUTE ONLY): 21 min  Charges:  $Neuromuscular Re-education: 8-22 mins                     [name], PT Acute Rehabilitation Services Pager [phone]  Office [phone]   [name] [name] 02/25/2019, 5:11 PM

## 2019-02-25 NOTE — Progress Notes (Signed)
PROGRESS NOTE   Gina [name]   ZOX:[id number]  DOB: [birth year]  DOA: 02/02/2019 PCP: [name], MD   Brief Narrative: PCCM transferred to [facility] 11/30 -The patient is a 58 year old female with severe protein calorie malnutrition, poorly controlled type 2 diabetes mellitus with bilateral foot ulcers, status post bilateral below-knee knee amputation, recent left phalanx amputation, schizophrenia, hypertension, severe protein calorie malnutrition was admitted to the ICU with septic shock , she was found to have a perforated gastric ulcer with pneumoperitoneum, also had evidence of DKA on admission -she was intubated, admitted to the ICU, treated with broad-spectrum antibiotics, pressors, aggressive fluid resuscitation, ultimately underwent ex lap on 11/23 which noted a perforated prepyloric ulcer which was oversewn and 3.1 L of contaminated peritoneal fluid was drained, she was subsequently extubated on 11/29. -She has had a extensive complicated hospital course notable for DKA, acute kidney injury, acute blood loss anemia, acute thrombocytopenia, intra-abdominal sepsis, bacteremia, fungemia. -General surgery continues to follow closely, NG tube was removed on 11/25, currently on TPN, during this hospital stay was also noted to have severe ischemic changes at her BKA site, vascular surgery as well as Dr. Sharol Given were consulted, they recommended consideration of above-knee amputation when patient is more stable -Palliative medicine team was consulted on 12/7  Subjective: Patient seen and examined early this afternoon. She seemed more confused today but not agitated. Denied pain. Afebrile. JP drain and NG tube with bile.   Assessment & Plan:  Severe septic shock due to ruptured gastric ulcer with acute peritonitis, fungemia and UTI - ID consulted on 02/04/19 and following.  - Blood cultures from 11/22> Candida Glabrata- repeat blood culture 11/24 NGTD - Urine culture 11/22 > K pneumoniae -  Peritoneal fluid 11/23> Candida albicans - Opthalmology consulted - no evidence of fungal endophthalmitis  - currently receiving Andulafungin (scheduled end date for 12/14) and Zosyn - ID has recommended to remove central line to give line holiday and right IJ was removed on 12/1 - placed PICC 12/3- Gen surgery resumed TPN -02/15/2019: Sepsis physiology has resolved significantly.  Infectious disease team is managing fungemia and intra-abdominal infection, antifungal and antibiotics per their recommendations.  -General surgery following, no surgical interventions at this time. Will repeat CT abd today.   Ischemic changes at bilateral BKA site -This is secondary to severe septic shock, required pressors.  -Seen by vascular surgery Dr. Scot Dock and orthopedics Dr. Sharol Given, they feel she will eventually require an above-knee amputation when she is more stable, patient declines and defers it at this time but would like to continue current medical management.    Acute kidney injury - improving - baseline Cr is < 1.0 - Cr rose to 2.66 on 11/26, likely from ATN,improved, back to baseline of <1 now.   ICU delirium -Likely secondary to all above infections, mental status improving slowly -Remains on as needed Haldol however has not required this in several days    DKA (diabetic ketoacidoses), severely uncontrolled with hyperglycemia  - resolved - A1c on 11/23>  15.2 -Continue Lantus and sliding scale, CBGs are stable now  Hypokalemia - replete as needed  Severe protein calorie malnutrition with hypoalbuminemia 02/15/2019: Patient still on TPN.  NG tube has been discontinued.  Currently on ice chips. -SLP is following and will assist in diet advancement.   Anemia due to acute blood loss, critical illness - Has required 2 units of PRBC this admission -Hgb staying in the 7-8 range. No active bleeding.  -Will repeat labs in  am, transfuse if Hgb <7. -Hgb of 7 this am, will repeat CBC in am.     Acute thrombocytopenia - related to septic shock - nadir was > 31 on 11/27 - Platelets have risen back to normal   Elevated HCV antibody - 11/25 - HCV > 11.0  Left phalanx amputation  - on 12/28/18- Hand surgery contacted by general surgery on 11/30 to request suture removal.  Dr. Wynetta Emery spoke to Dr. Caralyn Guile 12/7 who said that RN can remove sutures and place picture in chart and he will review.   -Sutures were removed on 12/9 by RN with no complications.   S/p Bl BKA  Ethics: This is a seriously ill patient with the extremely sick and complicated hospital course with fungemia, intra-abdominal infection following perforation, severe protein calorie malnutrition, uncontrolled diabetes, ischemic changes in bilateral BKA requiring bilateral AKA, remains at risk of additional complications and decline -Palliative medicine team was consulted on 12/7 for goals of care, had meeting with family on 12/9 but no updates from them since then. Per family, they wanted more time to consider further [place] discussion.   12/14: Family receptive to LTAC. CSW consulted and actively working on LTAC placement.     Time spent in minutes: 25 min  DVT prophylaxis: heparin Code Status: Full code Family Communication: None at bedside Disposition Plan: Stay in stepdown  Consultants:   General surgery  ID  Palliative Care  Procedures:   Intubation/ Extubation  Ex lap Antimicrobials:  Anti-infectives (From admission, onward)   Start     Dose/Rate Route Frequency Ordered Stop   02/18/19 1800  piperacillin-tazobactam (ZOSYN) IVPB 3.375 g     3.375 g 12.5 mL/hr over 240 Minutes Intravenous Every 8 hours 02/18/19 1652     02/05/19 1000  anidulafungin (ERAXIS) 100 mg in sodium chloride 0.9 % 100 mL IVPB  Status:  Discontinued     100 mg 78 mL/hr over 100 Minutes Intravenous Every 24 hours 02/04/19 0840 02/25/19 1604   02/04/19 1000  anidulafungin (ERAXIS) 200 mg in sodium chloride 0.9 % 200 mL IVPB      200 mg 78 mL/hr over 200 Minutes Intravenous  Once 02/04/19 0840 02/04/19 1301   02/04/19 0200  fluconazole (DIFLUCAN) IVPB 200 mg  Status:  Discontinued     200 mg 100 mL/hr over 60 Minutes Intravenous Every 24 hours 02/02/19 2302 02/04/19 0840   02/03/19 1800  vancomycin (VANCOCIN) IVPB 750 mg/150 ml premix  Status:  Discontinued     750 mg 150 mL/hr over 60 Minutes Intravenous Every 24 hours 02/03/19 0241 02/03/19 0242   02/03/19 1800  vancomycin (VANCOCIN) IVPB 750 mg/150 ml premix  Status:  Discontinued     750 mg 150 mL/hr over 60 Minutes Intravenous Every 24 hours 02/03/19 0242 02/03/19 0858   02/03/19 0600  piperacillin-tazobactam (ZOSYN) IVPB 3.375 g     3.375 g 12.5 mL/hr over 240 Minutes Intravenous Every 8 hours 02/03/19 0243 02/17/19 0205   02/02/19 2315  fluconazole (DIFLUCAN) IVPB 400 mg     400 mg 100 mL/hr over 120 Minutes Intravenous STAT 02/02/19 2301 02/03/19 0218   02/02/19 1800  ceFEPIme (MAXIPIME) 2 g in sodium chloride 0.9 % 100 mL IVPB     2 g 200 mL/hr over 30 Minutes Intravenous  Once 02/02/19 1745 02/02/19 2030   02/02/19 1800  metroNIDAZOLE (FLAGYL) IVPB 500 mg     500 mg 100 mL/hr over 60 Minutes Intravenous  Once 02/02/19 1745 02/02/19 2237  02/02/19 1800  vancomycin (VANCOCIN) IVPB 1000 mg/200 mL premix  Status:  Discontinued     1,000 mg 200 mL/hr over 60 Minutes Intravenous  Once 02/02/19 1745 02/02/19 1747   02/02/19 1800  vancomycin (VANCOCIN) 1,250 mg in sodium chloride 0.9 % 250 mL IVPB     1,250 mg 166.7 mL/hr over 90 Minutes Intravenous  Once 02/02/19 1747 02/02/19 2237      Objective: Vitals:   02/25/19 1100 02/25/19 1200 02/25/19 1500 02/25/19 1600  BP:  (!) 154/68  (!) 157/62  Pulse:  84    Resp:  17  13  Temp: 98.3 F (36.8 C)  (!) 97.3 F (36.3 C)   TempSrc: Oral  Oral   SpO2:      Weight:      Height:        Intake/Output Summary (Last 24 hours) at 02/25/2019 1842 Last data filed at 02/25/2019 1700 Gross per 24 hour   Intake 2609.09 ml  Output 1950 ml  Net 659.09 ml   Filed Weights   02/23/19 0500 02/24/19 0500 02/25/19 0500  Weight: 57.2 kg 54.7 kg 54.7 kg    Examination: Gen: Extremely cachectic chronically ill-appearing female laying in bed awake alert, answers "yes" or "no" to questions, asked me to place her glasses on.   HEENT: PERRLA, Neck supple, no JVD. NG tube in place.  Lungs: No respiratory distress, no wheezing.  CVS: RRR Abd: Soft, mild diffuse tenderness, mildly distended. JP drain in place with bile.  Extremities: Bilateral BKA with ischemic changes skin: Ischemic changes, discolorations noted in both lower legs near BKA site. Bilateral lower extremities covered with bandages that are c/d/i.   Flexi-Seal noted with stool content.  Data Reviewed: I have personally reviewed following labs and imaging studies  CBC: Recent Labs  Lab 02/20/19 0254 02/21/19 1110 02/22/19 0203 02/24/19 0206 02/24/19 1044 02/25/19 0547  WBC 9.7 12.3* 11.6* 13.2*  --  12.4*  NEUTROABS  --   --   --  9.4*  --   --   HGB 7.0* 7.1* 8.1* 7.0* 7.0* 7.0*  HCT 23.0* 22.7* 26.5* 23.7* 23.2* 22.7*  MCV 93.9 93.8 94.3 95.6  --  95.4  PLT 356 397 376 534*  --  374*   Basic Metabolic Panel: Recent Labs  Lab 02/19/19 0427 02/20/19 0254 02/21/19 1110 02/22/19 0203 02/23/19 1040 02/24/19 0206 02/25/19 0547  NA 134* 135 137 137 138 139 139  K 4.9 4.6 4.6 4.3 4.5 4.6 4.6  CL 107 106 108 108 108 108 108  CO2 21* 21* 23 20* 25 23 25   GLUCOSE 142* 146* 166* 162* 118* 129* 137*  BUN 36* 38* 40* 36* 43* 42* 44*  CREATININE 0.99 0.93 0.84 0.84 0.72 0.72 0.67  CALCIUM 8.2* 8.4* 8.3* 8.7* 8.6* 8.7* 8.7*  MG 2.1 2.2  --  2.0 2.3 2.2  --   PHOS 3.9 3.8  --  3.2 3.4 3.7  --    GFR: Estimated Creatinine Clearance: 61.4 mL/min (by C-G formula based on SCr of 0.67 mg/dL). Liver Function Tests: Recent Labs  Lab 02/20/19 0254 02/21/19 1110 02/22/19 0203 02/24/19 0206 02/25/19 0547  AST 15 16 18 26 22    ALT 8 10 12 21 19   ALKPHOS 72 104 142* 194* 206*  BILITOT 0.4 0.5 0.3 0.2* 0.5  PROT 5.8* 6.0* 6.0* 6.4* 6.4*  ALBUMIN 1.0* 1.1* 1.1* 1.2* 1.2*   No results for input(s): LIPASE, AMYLASE in the last 168 hours. No results  for input(s): AMMONIA in the last 168 hours. Coagulation Profile: No results for input(s): INR, PROTIME in the last 168 hours. Cardiac Enzymes: No results for input(s): CKTOTAL, CKMB, CKMBINDEX, TROPONINI in the last 168 hours. BNP (last 3 results) No results for input(s): PROBNP in the last 8760 hours. HbA1C: No results for input(s): HGBA1C in the last 72 hours. CBG: Recent Labs  Lab 02/24/19 1726 02/25/19 0001 02/25/19 0545 02/25/19 1136 02/25/19 1751  GLUCAP 115* 174* 127* 143* 119*   Lipid Profile: Recent Labs    02/24/19 0206  TRIG 141   Thyroid Function Tests: No results for input(s): TSH, T4TOTAL, FREET4, T3FREE, THYROIDAB in the last 72 hours. Anemia Panel: No results for input(s): VITAMINB12, FOLATE, FERRITIN, TIBC, IRON, RETICCTPCT in the last 72 hours. Urine analysis:    Component Value Date/Time   COLORURINE YELLOW 02/12/2019 1401   APPEARANCEUR CLOUDY (A) 02/12/2019 1401   LABSPEC 1.010 02/12/2019 1401   PHURINE 5.0 02/12/2019 1401   GLUCOSEU >=500 (A) 02/12/2019 1401   HGBUR MODERATE (A) 02/12/2019 1401   BILIRUBINUR NEGATIVE 02/12/2019 1401   BILIRUBINUR N 04/28/2016 1603   KETONESUR NEGATIVE 02/12/2019 1401   PROTEINUR 30 (A) 02/12/2019 1401   UROBILINOGEN 0.2 04/28/2016 1603   UROBILINOGEN 0.2 06/18/2013 0650   NITRITE NEGATIVE 02/12/2019 1401   LEUKOCYTESUR NEGATIVE 02/12/2019 1401   Recent Results (from the past 240 hour(s))  Culture, blood (routine x 2)     Status: None   Collection Time: 02/18/19  5:54 PM   Specimen: BLOOD LEFT ARM  Result Value Ref Range Status   Specimen Description   Final    BLOOD LEFT ARM Performed at [facility], [place] [address] [place], [place] [address]    Special  Requests   Final    BOTTLES DRAWN AEROBIC AND ANAEROBIC Blood Culture adequate volume Performed at [place] [address], [place], [place] [address]    Culture   Final    NO GROWTH 5 DAYS Performed at [facility], [place] [address] [place], [place] [address]    Report Status 02/24/2019 FINAL  Final  Culture, blood (routine x 2)     Status: None   Collection Time: 02/18/19  6:07 PM   Specimen: BLOOD LEFT ARM  Result Value Ref Range Status   Specimen Description   Final    BLOOD LEFT ARM Performed at [place] [facility] [address], [place], [place] [address]    Special Requests   Final    BOTTLES DRAWN AEROBIC AND ANAEROBIC Blood Culture adequate volume Performed at [place] [address], [place], [place] [address]    Culture   Final    NO GROWTH 5 DAYS Performed at [facility], [place] [address] [place], [place] [address]    Report Status 02/24/2019 FINAL  Final    Radiology Studies: CT ABDOMEN PELVIS W CONTRAST  Result Date: 02/25/2019 CLINICAL DATA:  Abdominal pain, status post gastric perforation repair, evaluate for abscess and the EXAM: CT ABDOMEN AND PELVIS WITH CONTRAST TECHNIQUE: Multidetector CT imaging of the abdomen and pelvis was performed using the standard protocol following bolus administration of intravenous contrast. CONTRAST:  165m OMNIPAQUE IOHEXOL 300 MG/ML SOLN, additional enteric contrast COMPARISON:  02/18/2019 FINDINGS: Lower chest: Moderate right, small left pleural effusions. Right pleural effusion is enlarged compared to prior examination, left pleural effusion is decreased. Hepatobiliary: No solid liver abnormality is seen. Gallstone in the contracted gallbladder. Gallbladder  wall thickening, unchanged compared to prior examination and nonspecific in the setting of ascites. Pancreas: Unremarkable. No pancreatic ductal dilatation or surrounding inflammatory changes. Spleen: Normal  in size without significant abnormality. Adrenals/Urinary Tract: Adrenal glands are unremarkable. Kidneys are normal, without renal calculi, solid lesion, or hydronephrosis. Bladder is unremarkable. Stomach/Bowel: Diffuse thickening of the small bowel and colon (series 3, image 54). Rectal tubes. Vascular/Lymphatic: Aortic atherosclerosis. Mildly enlarged retroperitoneal lymph nodes. Reproductive: Fluid in the endometrial cavity, unchanged compared to prior examination although abnormal in the postmenopausal setting (series 3, image 64). Other: No abdominal wall hernia. Postoperative findings of midline laparotomy. Surgical drain is positioned in the lesser omentum. Anasarca. There is a persistent fluid collection about the ventral abdomen, which is decreased in size compared to prior examination and partially contrast opacified, suspect persistent leak. This collection measures 14.0 x 3.2 x 14.5 cm, previously 19.6 x 4.2 x 15.2 cm when measured similarly (series 3, image 41, 55, series 7, image 87). Unchanged, loculated fluid collection about the lateral aspect of the right lobe of the liver measuring 7.9 x 4.8 x 1.4 cm (series 3, image 21, series 6, image 45). Interval decrease in size of a loculated appearing fluid collection between the uterus and rectum, measuring 6.1 x 3.1 cm, previously 6.7 x 4.4 cm (series 3, image 66). Interval increase in size of fluid adjacent to the greater curvature of the stomach and spleen in the left upper quadrant, measuring 8.1 x 6.2 cm, previously 6.3 x 3.4 cm (series 3, image 22). Musculoskeletal: Sacral decubitus wound (series 3, image 76). IMPRESSION: 1. There is a persistent fluid collection about the ventral abdomen, which is decreased in size compared to prior examination and partially contrast opacified, suspect persistent leak. This collection measures 14.0 x 3.2 x 14.5 cm, previously 19.6 x 4.2 x 15.2 cm when measured similarly (series 3, image 41, 55, series 7, image  87). 2. Additional fluid collections about the abdomen and pelvis as detailed above, some decreased in size compared to prior examination and some increased, possibly communicating and fluctuant. 3. Surgical drain positioned in the lesser omentum. 4. Diffuse thickening of the small bowel and colon, likely reactive in the setting of peritonitis. 5. Cholelithiasis with gallbladder wall thickening, unchanged compared to prior examination and nonspecific in the setting of ascites. 6. Moderate right, small left pleural effusions. Right pleural effusion is enlarged compared to prior examination, although left pleural effusion is decreased. 7. Anasarca. 8. Fluid in the endometrial cavity, unchanged compared to prior examination although abnormal in the postmenopausal setting. 9.  Aortic Atherosclerosis (ICD10-I70.0). Electronically Signed   By: Eddie Candle M.D.   On: 02/25/2019 16:30    Scheduled Meds: . chlorhexidine gluconate (MEDLINE KIT)  15 mL Mouth Rinse BID  . Chlorhexidine Gluconate Cloth  6 each Topical Daily  . collagenase   Topical Daily  . Gerhardt's butt cream   Topical TID  . heparin injection (subcutaneous)  5,000 Units Subcutaneous Q8H  . insulin aspart  0-20 Units Subcutaneous Q6H  . iohexol      . metoprolol tartrate  2.5 mg Intravenous Q6H  . pantoprazole (PROTONIX) IV  40 mg Intravenous Q12H  . sodium chloride (PF)      . sodium chloride flush  10-40 mL Intracatheter Q12H   Continuous Infusions: . sodium chloride 10 mL/hr at 02/18/19 1833  . sodium chloride 10 mL/hr at 02/25/19 0600  . piperacillin-tazobactam (ZOSYN)  IV 3.375 g (02/25/19 1831)  . TPN ADULT (ION) 90 mL/hr  at 02/25/19 1737     LOS: 23 days   Time spent: 74mn SBlain Pais MD  02/25/2019, 6:42 PM

## 2019-02-25 NOTE — Progress Notes (Signed)
Patient ID: Gina [name], female   DOB: 24-May-1960, 58 y.o.   MRN: [id number]    23 Days Post-Op  Subjective: Patient oriented but talks that "they didn't do her right" this am with her bath.  Neither the nurse or I understand what she is referring to.  She denies abdominal pain.    ROS: See above, otherwise other systems negative  Objective: Vital signs in last 24 hours: Temp:  [97.3 F (36.3 C)-98.2 F (36.8 C)] 98.2 F (36.8 C) (12/15 0400) Pulse Rate:  [79-94] 88 (12/15 0550) Resp:  [12-22] 22 (12/15 0550) BP: (112-153)/(48-70) 129/62 (12/15 0550) SpO2:  [100 %] 100 % (12/15 0550) Weight:  [54.7 kg] 54.7 kg (12/15 0500) Last BM Date: (flexi seal in place)  Intake/Output from previous day: 12/14 0701 - 12/15 0700 In: 2739.1 [P.O.:20; I.V.:2523.1; IV Piggyback:196] Out: 1950 [Urine:750; Emesis/NG output:400; Drains:800] Intake/Output this shift: No intake/output data recorded.  PE: Abd: soft, NT, ND, midline wound is clean and packed.  Surgical drain hooked to suction, 800cc bilious drainage, NGT with 400cc of bilious output yesterday  Lab Results:  Recent Labs    02/24/19 0206 02/24/19 1044 02/25/19 0547  WBC 13.2*  --  12.4*  HGB 7.0* 7.0* 7.0*  HCT 23.7* 23.2* 22.7*  PLT 534*  --  540*   BMET Recent Labs    02/24/19 0206 02/25/19 0547  NA 139 139  K 4.6 4.6  CL 108 108  CO2 23 25  GLUCOSE 129* 137*  BUN 42* 44*  CREATININE 0.72 0.67  CALCIUM 8.7* 8.7*   PT/INR No results for input(s): LABPROT, INR in the last 72 hours. CMP     Component Value Date/Time   NA 139 02/25/2019 0547   NA 139 10/04/2017 1705   K 4.6 02/25/2019 0547   CL 108 02/25/2019 0547   CO2 25 02/25/2019 0547   GLUCOSE 137 (H) 02/25/2019 0547   BUN 44 (H) 02/25/2019 0547   BUN 11 10/04/2017 1705   CREATININE 0.67 02/25/2019 0547   CREATININE 0.68 04/28/2016 1640   CALCIUM 8.7 (L) 02/25/2019 0547   PROT 6.4 (L) 02/25/2019 0547   PROT 7.1 10/04/2017 1705   ALBUMIN 1.2 (L)  02/25/2019 0547   ALBUMIN 3.7 10/04/2017 1705   AST 22 02/25/2019 0547   ALT 19 02/25/2019 0547   ALKPHOS 206 (H) 02/25/2019 0547   BILITOT 0.5 02/25/2019 0547   BILITOT <0.2 10/04/2017 1705   GFRNONAA >60 02/25/2019 0547   GFRNONAA >89 04/28/2016 1640   GFRAA >60 02/25/2019 0547   GFRAA >89 04/28/2016 1640   Lipase     Component Value Date/Time   LIPASE 27 04/01/2016 1040       Studies/Results: No results found.  Anti-infectives: Anti-infectives (From admission, onward)   Start     Dose/Rate Route Frequency Ordered Stop   02/18/19 1800  piperacillin-tazobactam (ZOSYN) IVPB 3.375 g     3.375 g 12.5 mL/hr over 240 Minutes Intravenous Every 8 hours 02/18/19 1652     02/05/19 1000  anidulafungin (ERAXIS) 100 mg in sodium chloride 0.9 % 100 mL IVPB     100 mg 78 mL/hr over 100 Minutes Intravenous Every 24 hours 02/04/19 0840     02/04/19 1000  anidulafungin (ERAXIS) 200 mg in sodium chloride 0.9 % 200 mL IVPB     200 mg 78 mL/hr over 200 Minutes Intravenous  Once 02/04/19 0840 02/04/19 1301   02/04/19 0200  fluconazole (DIFLUCAN) IVPB 200 mg  Status:  Discontinued  200 mg 100 mL/hr over 60 Minutes Intravenous Every 24 hours 02/02/19 2302 02/04/19 0840   02/03/19 1800  vancomycin (VANCOCIN) IVPB 750 mg/150 ml premix  Status:  Discontinued     750 mg 150 mL/hr over 60 Minutes Intravenous Every 24 hours 02/03/19 0241 02/03/19 0242   02/03/19 1800  vancomycin (VANCOCIN) IVPB 750 mg/150 ml premix  Status:  Discontinued     750 mg 150 mL/hr over 60 Minutes Intravenous Every 24 hours 02/03/19 0242 02/03/19 0858   02/03/19 0600  piperacillin-tazobactam (ZOSYN) IVPB 3.375 g     3.375 g 12.5 mL/hr over 240 Minutes Intravenous Every 8 hours 02/03/19 0243 02/17/19 0205   02/02/19 2315  fluconazole (DIFLUCAN) IVPB 400 mg     400 mg 100 mL/hr over 120 Minutes Intravenous STAT 02/02/19 2301 02/03/19 0218   02/02/19 1800  ceFEPIme (MAXIPIME) 2 g in sodium chloride 0.9 % 100 mL IVPB      2 g 200 mL/hr over 30 Minutes Intravenous  Once 02/02/19 1745 02/02/19 2030   02/02/19 1800  metroNIDAZOLE (FLAGYL) IVPB 500 mg     500 mg 100 mL/hr over 60 Minutes Intravenous  Once 02/02/19 1745 02/02/19 2237   02/02/19 1800  vancomycin (VANCOCIN) IVPB 1000 mg/200 mL premix  Status:  Discontinued     1,000 mg 200 mL/hr over 60 Minutes Intravenous  Once 02/02/19 1745 02/02/19 1747   02/02/19 1800  vancomycin (VANCOCIN) 1,250 mg in sodium chloride 0.9 % 250 mL IVPB     1,250 mg 166.7 mL/hr over 90 Minutes Intravenous  Once 02/02/19 1747 02/02/19 2237       Assessment/Plan VDRF- extubated 11/29 Poorly controlled DM2- A1c15.2 S/p bilateral BKA Ischemic changes to both LE- defer to medicineandDr. 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 No plans for sharp debridement at this time.  POD23,S/pex lap with oversew of duodenal ulcer,with Graham patch 11/23,Dr. Lucia Gaskins, for perforated prepyloric ulcer with gross contamination -now with apparent bile leak, although UGI negative but with limitations.CT scan also confirms leak but unable to tell where from. Small bowel appears flaccid and not healthy, but given significant high density material any specific details are difficult to conclude. - BID wet to dry dressing changes to midline abdominal wound - BID PPI -pulm toilet and IS -OOB to chair as much as possible -mobilize, PT/OT -cont TNAgiven leak, will be unable to take in enteral nutrition for the foreseeable future -will contJP Drain to suction today to help with drainage of all the bile from  her leak. -repeat CT scan today for reassess findings from last CT scan.  WBC up slightly to 12K, but AF, and no abdominal pain  ID - Eraxis 11/24>>Zosyn 11/23 >>12/6, 12/8 --> FEN -NPO/NGT VTE - NoSCDs,Bilateral BKa's - sq heparin  Foley -out Follow up -Dr. Lucia Gaskins   LOS: 23 days    Henreitta Cea , [facility] 02/25/2019, 7:56 AM Please see Amion for pager number during day hours 7:00am-4:30pm

## 2019-02-26 ENCOUNTER — Encounter (HOSPITAL_COMMUNITY): Payer: Self-pay | Admitting: Internal Medicine

## 2019-02-26 ENCOUNTER — Inpatient Hospital Stay (HOSPITAL_COMMUNITY): Payer: Medicare HMO

## 2019-02-26 LAB — CBC
HCT: 22.7 % — ABNORMAL LOW (ref 36.0–46.0)
Hemoglobin: 6.8 g/dL — CL (ref 12.0–15.0)
MCH: 28.8 pg (ref 26.0–34.0)
MCHC: 30 g/dL (ref 30.0–36.0)
MCV: 96.2 fL (ref 80.0–100.0)
Platelets: 628 10*3/uL — ABNORMAL HIGH (ref 150–400)
RBC: 2.36 MIL/uL — ABNORMAL LOW (ref 3.87–5.11)
RDW: 16 % — ABNORMAL HIGH (ref 11.5–15.5)
WBC: 11.7 10*3/uL — ABNORMAL HIGH (ref 4.0–10.5)
nRBC: 0.2 % (ref 0.0–0.2)

## 2019-02-26 LAB — GLUCOSE, CAPILLARY
Glucose-Capillary: 154 mg/dL — ABNORMAL HIGH (ref 70–99)
Glucose-Capillary: 104 mg/dL — ABNORMAL HIGH (ref 70–99)
Glucose-Capillary: 149 mg/dL — ABNORMAL HIGH (ref 70–99)
Glucose-Capillary: 164 mg/dL — ABNORMAL HIGH (ref 70–99)
Glucose-Capillary: 161 mg/dL — ABNORMAL HIGH (ref 70–99)

## 2019-02-26 LAB — PREPARE RBC (CROSSMATCH)

## 2019-02-26 LAB — HEMOGLOBIN AND HEMATOCRIT, BLOOD
HCT: 28.1 % — ABNORMAL LOW (ref 36.0–46.0)
Hemoglobin: 8.7 g/dL — ABNORMAL LOW (ref 12.0–15.0)

## 2019-02-26 MED ORDER — HEPARIN SODIUM (PORCINE) 5000 UNIT/ML IJ SOLN
5000.0000 [IU] | Freq: Three times a day (TID) | INTRAMUSCULAR | Status: DC
  Administered 2019-02-27 – 2019-03-03 (×13): 5000 [IU] via SUBCUTANEOUS
  Filled 2019-02-26 (×13): qty 1

## 2019-02-26 MED ORDER — NALOXONE HCL 0.4 MG/ML IJ SOLN
INTRAMUSCULAR | Status: AC
  Filled 2019-02-26: qty 1

## 2019-02-26 MED ORDER — HYDROCODONE-ACETAMINOPHEN 5-325 MG PO TABS: 1.0000 | ORAL_TABLET | ORAL | Status: DC | PRN

## 2019-02-26 MED ORDER — TRAVASOL 10 % IV SOLN
INTRAVENOUS | Status: AC
  Filled 2019-02-26: qty 1296

## 2019-02-26 MED ORDER — MIDAZOLAM HCL 2 MG/2ML IJ SOLN
INTRAMUSCULAR | Status: AC
  Filled 2019-02-26: qty 4

## 2019-02-26 MED ORDER — FENTANYL CITRATE (PF) 100 MCG/2ML IJ SOLN
INTRAMUSCULAR | Status: AC
  Filled 2019-02-26: qty 2

## 2019-02-26 MED ORDER — SODIUM CHLORIDE 0.9% IV SOLUTION: Freq: Once | INTRAVENOUS | Status: DC

## 2019-02-26 MED ORDER — FENTANYL CITRATE (PF) 100 MCG/2ML IJ SOLN
INTRAMUSCULAR | Status: AC | PRN
  Administered 2019-02-26: 50 ug via INTRAVENOUS

## 2019-02-26 MED ORDER — FLUMAZENIL 0.5 MG/5ML IV SOLN
INTRAVENOUS | Status: AC
  Filled 2019-02-26: qty 5

## 2019-02-26 MED ORDER — MIDAZOLAM HCL 2 MG/2ML IJ SOLN
INTRAMUSCULAR | Status: AC | PRN
  Administered 2019-02-26: 1 mg via INTRAVENOUS

## 2019-02-26 MED ORDER — LIDOCAINE HCL (PF) 1 % IJ SOLN
INTRAMUSCULAR | Status: AC | PRN
  Administered 2019-02-26: 10 mL

## 2019-02-26 MED ORDER — SODIUM CHLORIDE 0.9% FLUSH
5.0000 mL | Freq: Three times a day (TID) | INTRAVENOUS | Status: DC
  Administered 2019-02-27 – 2019-03-10 (×33): 5 mL

## 2019-02-26 NOTE — Progress Notes (Signed)
OT  Note  Patient Details Name: Angeni Liddell MRN: [id number] DOB: Jun 18, 1960   Cancelled Treatment:    Reason Eval/Treat Not Completed: Noted pts hemoglobin 6.8.  Will check for update later in the day in regards to ability to attempt therapy. Kari Baars, OT Acute Rehabilitation Services [phone] Office- [phone], Edwena Felty D 02/26/2019, 8:10 AM

## 2019-02-26 NOTE — Progress Notes (Signed)
CRITICAL VALUE ALERT  Critical Value:  Hemoglobin 6.8  Date & Time Notied:  [id number] 02/26/19  Provider Notified: Beaulah Corin  Orders Received/Actions taken: Awaiting

## 2019-02-26 NOTE — Progress Notes (Signed)
Pharmacy Brief  Note - Post-IR Anticoagulation Assessment:  DVT ppx: heparin 5000 units subQ q8h Procedure: CT guided LLQ ant peritoneal drain catheter placement Bleeding risk: Standard Time to resume HSQ: At least 6 hours post-procedure or next dosing interval  HSQ currently ordered to resume on 12/17 at 0600. No adjustment needed.  Lenis Noon, PharmD 02/26/19 6:09 PM

## 2019-02-26 NOTE — Progress Notes (Addendum)
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  She was found to have perforated gastric ulcer with pneumoperitoneum, also had evidence DKA.  She was intubated and admitted to ICU.  Started on broad-spectrum antibiotics, pressors, IV fluids.  She underwent expiratory laparotomy 11/23 with finding of perforated prepyloric ulcer which was repaired, contaminated peritoneal fluid was drained.  She was extubated on 11/29.  She had extensive complicated hospital course with DKA, AKI, acute blood loss anemia, thrombocytopenia, intra-abdominal infection, bacteremia, fungemia.  ID was also following.  General surgery closely following.  NG tube removed on 11/25.  Currently on IV antibiotics.  Abdominal CT done on 12/15 showed persistent abdominal collection.  IR also consulted for possible drainage.  Assessment & Plan:   Principal Problem:   Septic shock ([place]) Active Problems:   DKA (diabetic ketoacidoses) ([place])   Severe protein-calorie malnutrition ([place])   Acute kidney injury ([place])   Pressure injury of skin   Perforated gastric ulcer ([place])   Acute respiratory failure ([place])   Duodenal perforation ([facility])   Below-knee amputee ([place])   Atherosclerosis of native arteries of extremities with gangrene, right leg ([facility])   Atherosclerosis of native arteries of extremities with gangrene, left leg ([facility])   Septic shock due to ruptured gastric ulcer with acute peritonitis, fungemia, UTI: S/pex lap with oversew of duodenal ulcer,with Graham patch 11/23,DrLucia Gaskins, for perforated prepyloric ulcer with gross contamination.General surgery following, ID was consulted  and following.  Currently on Zosyn.  Blood cultures had shown Candida glabrata and she completed the course for fungemia.  Urine culture showed Klebsiella pneumoniae.  Peritoneal fluid culture showed Candida albicans. No evidence of fungal endophthalmitis after ophthalmology consultation. ID recommending to continue Zosyn for additional 2 weeks starting from 12/15 and switched to Augmentin for additional 2 weeks after that. CT abdomen done on 12/15 showed persistent large abscess, persistent fluid collection around the ventral abdomen.  General surgery aware.  IR consulted for possible drainage.Has JP drain and midline surgical wound.  Ischemic changes at bilateral BKA site: Possibly secondary to severe septic shock.  She was seen by vascular surgery, orthopedics.  She will eventually need above-knee amputation when she is more stable.  Patient declined surgery at this time so continue current medical treatment  Acute kidney injury: Back to baseline now  ICU delirium: Secondary to infectious etiology, hospital admission, ICU environment.  Mental status slowly improving.  Continue Haldol as needed.  DKA with severely uncontrolled hyperglycemia: Resolved.  Hemoglobin A1c of 15.2 as per 11/23.  Continue current insulin regimen  Severe protein calorie malnutrition with hypoalbuminemia: Started on TPN.   Dietitian following  Anemia due to acute blood loss, critical illness: Being transfused today with a unit of PRBC after her hemoglobin dropped to the range of 6.  Total of 3 units so far this hospitalization.  Continue to monitor CBC  Acute thrombocytopenia: Secondary to septic shock.  Resolved  Status post bilateral BKA, left phalanx amputation: Hand surgery and orthopedics are following.  Debility/deconditioning/poor prognosis: Extremely sick patient.  Completed hospital course.  Palliative  care was consulted for goals of care discussion.  Had meeting with family on 12/9.  Family wanted more time  to consider further goals of care discussion.  They are interested on placing her to LTAC.  Social worker consulted.  Pressure Injury 12/26/18 Buttocks Left Stage II -  Partial thickness loss of dermis presenting as a shallow open ulcer with a red, pink wound bed without slough. (Active)  12/26/18 0630  Location: Buttocks  Location Orientation: Left  Staging: Stage II -  Partial thickness loss of dermis presenting as a shallow open ulcer with a red, pink wound bed without slough.  Wound Description (Comments):   Present on Admission: Yes     Pressure Injury 12/26/18 Buttocks Right;Lower Stage II -  Partial thickness loss of dermis presenting as a shallow open ulcer with a red, pink wound bed without slough. (Active)  12/26/18 0630  Location: Buttocks  Location Orientation: Right;Lower  Staging: Stage II -  Partial thickness loss of dermis presenting as a shallow open ulcer with a red, pink wound bed without slough.  Wound Description (Comments):   Present on Admission: Yes     Pressure Injury 02/03/19 Coccyx Right;Left;Medial Unstageable - Full thickness tissue loss in which the base of the ulcer is covered by slough (yellow, tan, gray, green or brown) and/or eschar (tan, brown or black) in the wound bed. pt has 3 small injuri (Active)  02/03/19 0230  Location: Coccyx  Location Orientation: Right;Left;Medial  Staging: Unstageable - Full thickness tissue loss in which the base of the ulcer is covered by slough (yellow, tan, gray, green or brown) and/or eschar (tan, brown or black) in the wound bed.  Wound Description (Comments): pt has 3 small injuries all in the same area, one is medial and two are on either side of it  Present on Admission: Yes           Nutrition Problem: Inadequate oral intake Etiology: altered GI function(pyloric channel ulcer perforation s/p ex lap)      DVT prophylaxis:Heparin  Code Status: Full Family Communication: None present at the  bedside Disposition Plan: Undetermined at this point   Consultants: Neurosurgery, PCCM, ID  Procedures: Exploratory laparotomy  Antimicrobials:  Anti-infectives (From admission, onward)   Start     Dose/Rate Route Frequency Ordered Stop   02/18/19 1800  piperacillin-tazobactam (ZOSYN) IVPB 3.375 g     3.375 g 12.5 mL/hr over 240 Minutes Intravenous Every 8 hours 02/18/19 1652     02/05/19 1000  anidulafungin (ERAXIS) 100 mg in sodium chloride 0.9 % 100 mL IVPB  Status:  Discontinued     100 mg 78 mL/hr over 100 Minutes Intravenous Every 24 hours 02/04/19 0840 02/25/19 1604   02/04/19 1000  anidulafungin (ERAXIS) 200 mg in sodium chloride 0.9 % 200 mL IVPB     200 mg 78 mL/hr over 200 Minutes Intravenous  Once 02/04/19 0840 02/04/19 1301   02/04/19 0200  fluconazole (DIFLUCAN) IVPB 200 mg  Status:  Discontinued     200 mg 100 mL/hr over 60 Minutes Intravenous Every 24 hours 02/02/19 2302 02/04/19 0840   02/03/19 1800  vancomycin (VANCOCIN) IVPB 750 mg/150 ml premix  Status:  Discontinued     750 mg 150 mL/hr over 60 Minutes Intravenous Every 24 hours 02/03/19 0241 02/03/19 0242   02/03/19 1800  vancomycin (VANCOCIN) IVPB 750 mg/150 ml premix  Status:  Discontinued     750 mg 150 mL/hr over 60 Minutes Intravenous Every 24 hours 02/03/19 0242 02/03/19 0858   02/03/19 0600  piperacillin-tazobactam (ZOSYN) IVPB 3.375 g     3.375 g 12.5 mL/hr  over 240 Minutes Intravenous Every 8 hours 02/03/19 0243 02/17/19 0205   02/02/19 2315  fluconazole (DIFLUCAN) IVPB 400 mg     400 mg 100 mL/hr over 120 Minutes Intravenous STAT 02/02/19 2301 02/03/19 0218   02/02/19 1800  ceFEPIme (MAXIPIME) 2 g in sodium chloride 0.9 % 100 mL IVPB     2 g 200 mL/hr over 30 Minutes Intravenous  Once 02/02/19 1745 02/02/19 2030   02/02/19 1800  metroNIDAZOLE (FLAGYL) IVPB 500 mg     500 mg 100 mL/hr over 60 Minutes Intravenous  Once 02/02/19 1745 02/02/19 2237   02/02/19 1800  vancomycin (VANCOCIN) IVPB 1000  mg/200 mL premix  Status:  Discontinued     1,000 mg 200 mL/hr over 60 Minutes Intravenous  Once 02/02/19 1745 02/02/19 1747   02/02/19 1800  vancomycin (VANCOCIN) 1,250 mg in sodium chloride 0.9 % 250 mL IVPB     1,250 mg 166.7 mL/hr over 90 Minutes Intravenous  Once 02/02/19 1747 02/02/19 2237      Subjective: Patient seen and examined the bedside this morning.  Hemodynamically stable.  Complains of diffuse abdominal pain.  Updated about the CT finding.  Objective: Vitals:   02/26/19 0412 02/26/19 0643 02/26/19 0709 02/26/19 0800  BP:  (!) 144/54 (!) 113/33   Pulse:  89    Resp:  11 15   Temp:  98 F (36.7 C) 98 F (36.7 C) 99.6 F (37.6 C)  TempSrc:  Axillary Axillary Oral  SpO2:  100% 100%   Weight: 54.7 kg     Height:        Intake/Output Summary (Last 24 hours) at 02/26/2019 0900 Last data filed at 02/26/2019 0400 Gross per 24 hour  Intake 2336.05 ml  Output 1775 ml  Net 561.05 ml   Filed Weights   02/24/19 0500 02/25/19 0500 02/26/19 0412  Weight: 54.7 kg 54.7 kg 54.7 kg    Examination:  General exam: Deconditioned, debilitated, weak HEENT:PERRL, Ear/Nose normal on gross exam Respiratory system: Bilateral equal air entry, normal vesicular breath sounds, no wheezes or crackles  Cardiovascular system: S1 & S2 heard, RRR. No JVD, murmurs, rubs, gallops or clicks.  Gastrointestinal system: Abdomen is mildly distended, soft and has diffuse abdominal tenderness.  Midline abdominal surgical wound, JP drain draining yellow fluid  Central nervous system: Alert and awake  extremities: Bilateral BKA.  Amputation site covered with dressing, amputation of left middle finger Skin: No rashes, lesions no icterus ,no pallor.  Unstageable sacral wound   Data Reviewed: I have personally reviewed following labs and imaging studies  CBC: Recent Labs  Lab 02/21/19 1110 02/22/19 0203 02/24/19 0206 02/24/19 1044 02/25/19 0547 02/26/19 0500  WBC 12.3* 11.6* 13.2*  --   12.4* 11.7*  NEUTROABS  --   --  9.4*  --   --   --   HGB 7.1* 8.1* 7.0* 7.0* 7.0* 6.8*  HCT 22.7* 26.5* 23.7* 23.2* 22.7* 22.7*  MCV 93.8 94.3 95.6  --  95.4 96.2  PLT 397 376 534*  --  540* 761*   Basic Metabolic Panel: Recent Labs  Lab 02/20/19 0254 02/21/19 1110 02/22/19 0203 02/23/19 1040 02/24/19 0206 02/25/19 0547  NA 135 137 137 138 139 139  K 4.6 4.6 4.3 4.5 4.6 4.6  CL 106 108 108 108 108 108  CO2 21* 23 20* _0 GLUCOSE 146* 166* 162* 118* 129* 137*  BUN 38* 40* 36* 43* 42* 44*  CREATININE 0.93 0.84 0.84 0.72 0.72  0.67  CALCIUM 8.4* 8.3* 8.7* 8.6* 8.7* 8.7*  MG 2.2  --  2.0 2.3 2.2  --   PHOS 3.8  --  3.2 3.4 3.7  --    GFR: Estimated Creatinine Clearance: 61.4 mL/min (by C-G formula based on SCr of 0.67 mg/dL). Liver Function Tests: Recent Labs  Lab 02/20/19 0254 02/21/19 1110 02/22/19 0203 02/24/19 0206 02/25/19 0547  AST _0 ALT _1 ALKPHOS 72 104 142* 194* 206*  BILITOT 0.4 0.5 0.3 0.2* 0.5  PROT 5.8* 6.0* 6.0* 6.4* 6.4*  ALBUMIN 1.0* 1.1* 1.1* 1.2* 1.2*   No results for input(s): LIPASE, AMYLASE in the last 168 hours. No results for input(s): AMMONIA in the last 168 hours. Coagulation Profile: No results for input(s): INR, PROTIME in the last 168 hours. Cardiac Enzymes: No results for input(s): CKTOTAL, CKMB, CKMBINDEX, TROPONINI in the last 168 hours. BNP (last 3 results) No results for input(s): PROBNP in the last 8760 hours. HbA1C: No results for input(s): HGBA1C in the last 72 hours. CBG: Recent Labs  Lab 02/25/19 0545 02/25/19 1136 02/25/19 1751 02/25/19 2310 02/26/19 0510  GLUCAP 127* 143* 119* 154* 104*   Lipid Profile: Recent Labs    02/24/19 0206  TRIG 141   Thyroid Function Tests: No results for input(s): TSH, T4TOTAL, FREET4, T3FREE, THYROIDAB in the last 72 hours. Anemia Panel: No results for input(s): VITAMINB12, FOLATE, FERRITIN, TIBC, IRON, RETICCTPCT in the last 72 hours. Sepsis  Labs: No results for input(s): PROCALCITON, LATICACIDVEN in the last 168 hours.  Recent Results (from the past 240 hour(s))  Culture, blood (routine x 2)     Status: None   Collection Time: 02/18/19  5:54 PM   Specimen: BLOOD LEFT ARM  Result Value Ref Range Status   Specimen Description   Final    BLOOD LEFT ARM Performed at [facility] [address], [place], [place] [address]    Special Requests   Final    BOTTLES DRAWN AEROBIC AND ANAEROBIC Blood Culture adequate volume Performed at [facility] [address] [place], [place] [address]    Culture   Final    NO GROWTH 5 DAYS Performed at [facility], [place] [address], [place], [place] [address]    Report Status 02/24/2019 FINAL  Final  Culture, blood (routine x 2)     Status: None   Collection Time: 02/18/19  6:07 PM   Specimen: BLOOD LEFT ARM  Result Value Ref Range Status   Specimen Description   Final    BLOOD LEFT ARM Performed at [facility] [address] [place], [place] [address]    Special Requests   Final    BOTTLES DRAWN AEROBIC AND ANAEROBIC Blood Culture adequate volume Performed at [facility] [address] [place], [place] [address]    Culture   Final    NO GROWTH 5 DAYS Performed at [facility], [place] [address] [place], [place] [address]    Report Status 02/24/2019 FINAL  Final         Radiology Studies: CT ABDOMEN PELVIS W CONTRAST  Result Date: 02/25/2019 CLINICAL DATA:  Abdominal pain, status post gastric perforation repair, evaluate for abscess and the EXAM: CT ABDOMEN AND PELVIS WITH CONTRAST TECHNIQUE: Multidetector CT imaging of the abdomen and pelvis was performed using the standard protocol following bolus administration of intravenous contrast. CONTRAST:  140m OMNIPAQUE IOHEXOL  300 MG/ML SOLN, additional enteric contrast COMPARISON:  02/18/2019 FINDINGS: Lower chest: Moderate right, small  left pleural effusions. Right pleural effusion is enlarged compared to prior examination, left pleural effusion is decreased. Hepatobiliary: No solid liver abnormality is seen. Gallstone in the contracted gallbladder. Gallbladder wall thickening, unchanged compared to prior examination and nonspecific in the setting of ascites. Pancreas: Unremarkable. No pancreatic ductal dilatation or surrounding inflammatory changes. Spleen: Normal in size without significant abnormality. Adrenals/Urinary Tract: Adrenal glands are unremarkable. Kidneys are normal, without renal calculi, solid lesion, or hydronephrosis. Bladder is unremarkable. Stomach/Bowel: Diffuse thickening of the small bowel and colon (series 3, image 54). Rectal tubes. Vascular/Lymphatic: Aortic atherosclerosis. Mildly enlarged retroperitoneal lymph nodes. Reproductive: Fluid in the endometrial cavity, unchanged compared to prior examination although abnormal in the postmenopausal setting (series 3, image 64). Other: No abdominal wall hernia. Postoperative findings of midline laparotomy. Surgical drain is positioned in the lesser omentum. Anasarca. There is a persistent fluid collection about the ventral abdomen, which is decreased in size compared to prior examination and partially contrast opacified, suspect persistent leak. This collection measures 14.0 x 3.2 x 14.5 cm, previously 19.6 x 4.2 x 15.2 cm when measured similarly (series 3, image 41, 55, series 7, image 87). Unchanged, loculated fluid collection about the lateral aspect of the right lobe of the liver measuring 7.9 x 4.8 x 1.4 cm (series 3, image 21, series 6, image 45). Interval decrease in size of a loculated appearing fluid collection between the uterus and rectum, measuring 6.1 x 3.1 cm, previously 6.7 x 4.4 cm (series 3, image 66). Interval increase in size of fluid adjacent to the greater curvature of the stomach and spleen in the left upper quadrant, measuring 8.1 x 6.2 cm, previously 6.3  x 3.4 cm (series 3, image 22). Musculoskeletal: Sacral decubitus wound (series 3, image 76). IMPRESSION: 1. There is a persistent fluid collection about the ventral abdomen, which is decreased in size compared to prior examination and partially contrast opacified, suspect persistent leak. This collection measures 14.0 x 3.2 x 14.5 cm, previously 19.6 x 4.2 x 15.2 cm when measured similarly (series 3, image 41, 55, series 7, image 87). 2. Additional fluid collections about the abdomen and pelvis as detailed above, some decreased in size compared to prior examination and some increased, possibly communicating and fluctuant. 3. Surgical drain positioned in the lesser omentum. 4. Diffuse thickening of the small bowel and colon, likely reactive in the setting of peritonitis. 5. Cholelithiasis with gallbladder wall thickening, unchanged compared to prior examination and nonspecific in the setting of ascites. 6. Moderate right, small left pleural effusions. Right pleural effusion is enlarged compared to prior examination, although left pleural effusion is decreased. 7. Anasarca. 8. Fluid in the endometrial cavity, unchanged compared to prior examination although abnormal in the postmenopausal setting. 9.  Aortic Atherosclerosis (ICD10-I70.0). Electronically Signed   By: Eddie Candle M.D.   On: 02/25/2019 16:30        Scheduled Meds: . sodium chloride   Intravenous Once  . chlorhexidine gluconate (MEDLINE KIT)  15 mL Mouth Rinse BID  . Chlorhexidine Gluconate Cloth  6 each Topical Daily  . collagenase   Topical Daily  . Gerhardt's butt cream   Topical TID  . heparin injection (subcutaneous)  5,000 Units Subcutaneous Q8H  . insulin aspart  0-20 Units Subcutaneous Q6H  . metoprolol tartrate  2.5 mg Intravenous Q6H  . pantoprazole (PROTONIX) IV  40 mg Intravenous Q12H  . sodium chloride flush  10-40  mL Intracatheter Q12H   Continuous Infusions: . sodium chloride 10 mL/hr at 02/18/19 1833  . sodium chloride  10 mL/hr at 02/25/19 0600  . piperacillin-tazobactam (ZOSYN)  IV Stopped (02/26/19 0505)  . TPN ADULT (ION) 90 mL/hr at 02/26/19 0400  . TPN ADULT (ION)       LOS: 24 days    Time spent: 35 mins.More than 50% of that time was spent in counseling and/or coordination of care.      Shelly Coss, MD [facility] Pager [phone]  If 7PM-7AM, please contact night-coverage [email or web address] Password [email or web address] 02/26/2019, 9:00 AM

## 2019-02-26 NOTE — Progress Notes (Signed)
Patient ID: Gina [name], female   DOB: 07/28/1960, 58 y.o.   MRN: [id number]    24 Days Post-Op  Subjective: Awakens.  Tries to talk but mumbles and difficult to understands.  Shakes head no to abdominal pain  ROS: unable  Objective: Vital signs in last 24 hours: Temp:  [97.3 F (36.3 C)-99.8 F (37.7 C)] 99.6 F (37.6 C) (12/16 0800) Pulse Rate:  [78-98] 89 (12/16 0643) Resp:  [11-22] 15 (12/16 0709) BP: (113-157)/(33-68) 113/33 (12/16 0709) SpO2:  [100 %] 100 % (12/16 0709) Weight:  [54.7 kg] 54.7 kg (12/16 0412) Last BM Date: (flexi seal in place)  Intake/Output from previous day: 12/15 0701 - 12/16 0700 In: 2336.1 [I.V.:2067.6; IV Piggyback:268.5] Out: [id number] [Urine:750; Emesis/NG output:75; Drains:950] Intake/Output this shift: No intake/output data recorded.  PE: Abd: soft, nontender, ND, +BS, surgical drain with 950cc of bilious output, NGT with low output of bilious contents.  Midline wound stable. Skin: sacral decub is cleaning up with santyl.  Still unstageable but mostly with fibrinous coverage now as eschar is being debrided.  Lab Results:  Recent Labs    02/25/19 0547 02/26/19 0500  WBC 12.4* 11.7*  HGB 7.0* 6.8*  HCT 22.7* 22.7*  PLT 540* 628*   BMET Recent Labs    02/24/19 0206 02/25/19 0547  NA 139 139  K 4.6 4.6  CL 108 108  CO2 23 25  GLUCOSE 129* 137*  BUN 42* 44*  CREATININE 0.72 0.67  CALCIUM 8.7* 8.7*   PT/INR No results for input(s): LABPROT, INR in the last 72 hours. CMP     Component Value Date/Time   NA 139 02/25/2019 0547   NA 139 10/04/2017 1705   K 4.6 02/25/2019 0547   CL 108 02/25/2019 0547   CO2 25 02/25/2019 0547   GLUCOSE 137 (H) 02/25/2019 0547   BUN 44 (H) 02/25/2019 0547   BUN 11 10/04/2017 1705   CREATININE 0.67 02/25/2019 0547   CREATININE 0.68 04/28/2016 1640   CALCIUM 8.7 (L) 02/25/2019 0547   PROT 6.4 (L) 02/25/2019 0547   PROT 7.1 10/04/2017 1705   ALBUMIN 1.2 (L) 02/25/2019 0547   ALBUMIN 3.7  10/04/2017 1705   AST 22 02/25/2019 0547   ALT 19 02/25/2019 0547   ALKPHOS 206 (H) 02/25/2019 0547   BILITOT 0.5 02/25/2019 0547   BILITOT <0.2 10/04/2017 1705   GFRNONAA >60 02/25/2019 0547   GFRNONAA >89 04/28/2016 1640   GFRAA >60 02/25/2019 0547   GFRAA >89 04/28/2016 1640   Lipase     Component Value Date/Time   LIPASE 27 04/01/2016 1040       Studies/Results: CT ABDOMEN PELVIS W CONTRAST  Result Date: 02/25/2019 CLINICAL DATA:  Abdominal pain, status post gastric perforation repair, evaluate for abscess and the EXAM: CT ABDOMEN AND PELVIS WITH CONTRAST TECHNIQUE: Multidetector CT imaging of the abdomen and pelvis was performed using the standard protocol following bolus administration of intravenous contrast. CONTRAST:  119mL OMNIPAQUE IOHEXOL 300 MG/ML SOLN, additional enteric contrast COMPARISON:  02/18/2019 FINDINGS: Lower chest: Moderate right, small left pleural effusions. Right pleural effusion is enlarged compared to prior examination, left pleural effusion is decreased. Hepatobiliary: No solid liver abnormality is seen. Gallstone in the contracted gallbladder. Gallbladder wall thickening, unchanged compared to prior examination and nonspecific in the setting of ascites. Pancreas: Unremarkable. No pancreatic ductal dilatation or surrounding inflammatory changes. Spleen: Normal in size without significant abnormality. Adrenals/Urinary Tract: Adrenal glands are unremarkable. Kidneys are normal, without renal calculi, solid lesion, or  hydronephrosis. Bladder is unremarkable. Stomach/Bowel: Diffuse thickening of the small bowel and colon (series 3, image 54). Rectal tubes. Vascular/Lymphatic: Aortic atherosclerosis. Mildly enlarged retroperitoneal lymph nodes. Reproductive: Fluid in the endometrial cavity, unchanged compared to prior examination although abnormal in the postmenopausal setting (series 3, image 64). Other: No abdominal wall hernia. Postoperative findings of midline  laparotomy. Surgical drain is positioned in the lesser omentum. Anasarca. There is a persistent fluid collection about the ventral abdomen, which is decreased in size compared to prior examination and partially contrast opacified, suspect persistent leak. This collection measures 14.0 x 3.2 x 14.5 cm, previously 19.6 x 4.2 x 15.2 cm when measured similarly (series 3, image 41, 55, series 7, image 87). Unchanged, loculated fluid collection about the lateral aspect of the right lobe of the liver measuring 7.9 x 4.8 x 1.4 cm (series 3, image 21, series 6, image 45). Interval decrease in size of a loculated appearing fluid collection between the uterus and rectum, measuring 6.1 x 3.1 cm, previously 6.7 x 4.4 cm (series 3, image 66). Interval increase in size of fluid adjacent to the greater curvature of the stomach and spleen in the left upper quadrant, measuring 8.1 x 6.2 cm, previously 6.3 x 3.4 cm (series 3, image 22). Musculoskeletal: Sacral decubitus wound (series 3, image 76). IMPRESSION: 1. There is a persistent fluid collection about the ventral abdomen, which is decreased in size compared to prior examination and partially contrast opacified, suspect persistent leak. This collection measures 14.0 x 3.2 x 14.5 cm, previously 19.6 x 4.2 x 15.2 cm when measured similarly (series 3, image 41, 55, series 7, image 87). 2. Additional fluid collections about the abdomen and pelvis as detailed above, some decreased in size compared to prior examination and some increased, possibly communicating and fluctuant. 3. Surgical drain positioned in the lesser omentum. 4. Diffuse thickening of the small bowel and colon, likely reactive in the setting of peritonitis. 5. Cholelithiasis with gallbladder wall thickening, unchanged compared to prior examination and nonspecific in the setting of ascites. 6. Moderate right, small left pleural effusions. Right pleural effusion is enlarged compared to prior examination, although left  pleural effusion is decreased. 7. Anasarca. 8. Fluid in the endometrial cavity, unchanged compared to prior examination although abnormal in the postmenopausal setting. 9.  Aortic Atherosclerosis (ICD10-I70.0). Electronically Signed   By: Eddie Candle M.D.   On: 02/25/2019 16:30    Anti-infectives: Anti-infectives (From admission, onward)   Start     Dose/Rate Route Frequency Ordered Stop   02/18/19 1800  piperacillin-tazobactam (ZOSYN) IVPB 3.375 g     3.375 g 12.5 mL/hr over 240 Minutes Intravenous Every 8 hours 02/18/19 1652     02/05/19 1000  anidulafungin (ERAXIS) 100 mg in sodium chloride 0.9 % 100 mL IVPB  Status:  Discontinued     100 mg 78 mL/hr over 100 Minutes Intravenous Every 24 hours 02/04/19 0840 02/25/19 1604   02/04/19 1000  anidulafungin (ERAXIS) 200 mg in sodium chloride 0.9 % 200 mL IVPB     200 mg 78 mL/hr over 200 Minutes Intravenous  Once 02/04/19 0840 02/04/19 1301   02/04/19 0200  fluconazole (DIFLUCAN) IVPB 200 mg  Status:  Discontinued     200 mg 100 mL/hr over 60 Minutes Intravenous Every 24 hours 02/02/19 2302 02/04/19 0840   02/03/19 1800  vancomycin (VANCOCIN) IVPB 750 mg/150 ml premix  Status:  Discontinued     750 mg 150 mL/hr over 60 Minutes Intravenous Every 24 hours 02/03/19 0241  02/03/19 0242   02/03/19 1800  vancomycin (VANCOCIN) IVPB 750 mg/150 ml premix  Status:  Discontinued     750 mg 150 mL/hr over 60 Minutes Intravenous Every 24 hours 02/03/19 0242 02/03/19 0858   02/03/19 0600  piperacillin-tazobactam (ZOSYN) IVPB 3.375 g     3.375 g 12.5 mL/hr over 240 Minutes Intravenous Every 8 hours 02/03/19 0243 02/17/19 0205   02/02/19 2315  fluconazole (DIFLUCAN) IVPB 400 mg     400 mg 100 mL/hr over 120 Minutes Intravenous STAT 02/02/19 2301 02/03/19 0218   02/02/19 1800  ceFEPIme (MAXIPIME) 2 g in sodium chloride 0.9 % 100 mL IVPB     2 g 200 mL/hr over 30 Minutes Intravenous  Once 02/02/19 1745 02/02/19 2030   02/02/19 1800  metroNIDAZOLE  (FLAGYL) IVPB 500 mg     500 mg 100 mL/hr over 60 Minutes Intravenous  Once 02/02/19 1745 02/02/19 2237   02/02/19 1800  vancomycin (VANCOCIN) IVPB 1000 mg/200 mL premix  Status:  Discontinued     1,000 mg 200 mL/hr over 60 Minutes Intravenous  Once 02/02/19 1745 02/02/19 1747   02/02/19 1800  vancomycin (VANCOCIN) 1,250 mg in sodium chloride 0.9 % 250 mL IVPB     1,250 mg 166.7 mL/hr over 90 Minutes Intravenous  Once 02/02/19 1747 02/02/19 2237       Assessment/Plan VDRF- extubated 11/29 Poorly controlled DM2- A1c15.2 S/p bilateral BKA Ischemic changes to both LE- defer to medicineandDr. 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    POD24,S/pex lap with oversew of duodenal ulcer,with Graham patch 11/23,Dr. Lucia Gaskins, for perforated prepyloric ulcer with gross contamination -persistent leak noted on CT scan with several fluid collections in the ventral abdomen and in pelvis.  Will see if ventral mid/left abdominal fluid collection is drainable as surgical drain is posterior to this. - BID wet to dry dressing changes to midline abdominal wound - BID PPI -pulm toilet and IS -OOB to chair as much as possible -mobilize, PT/OT -cont TNAgiven leak, will be unable to take in enteral nutrition for the foreseeable future -will cont JP Drain to suction today to help with drainage of all the bile from her leak. -cont conservative management as surgical intervention would be more likely to cause more complications than help at this point due to time from initial operation, SPCM, multiple comorbidities, etc. -agree with LTAC placement when able -ID recommends 2 more weeks of  IV zosyn, then 2 weeks or oral augmentin, unclear that she will be able to to take oral meds at that time still given leak  ID - Eraxis 11/24>>Zosyn 11/23 >>12/6, 12/8 --> FEN -NPO/NGT VTE - NoSCDs,Bilateral BKa's - sq heparin  Foley -out Follow up -Dr. Lucia Gaskins   LOS: 24 days    Henreitta Cea , [facility] 02/26/2019, 8:36 AM Please see Amion for pager number during day hours 7:00am-4:30pm

## 2019-02-26 NOTE — H&P (Signed)
Chief Complaint: Abdominal abscess  Referring Physician(s): Greer Pickerel  Supervising Physician: Arne Cleveland  Patient Status: [facility] - In-pt  History of Present Illness: Gina [name] is a 58 y.o. female who is s/pex lap with oversew of duodenal ulcer,Graham patch 11/23 byDr. Lucia Gaskins for perforated prepyloric ulcer with gross contamination.  This is hospital day #24.  She has a persistent leak noted on CT scan with several fluid collections in the ventral abdomen and in pelvis.    We are asked to evaluate the ventral mid/left abdominal fluid collection to determine if it is drainable. The surgical drain is posterior to this.  She has an NGT in place and is on parenteral nutrition.  Past Medical History:  Diagnosis Date  . Diabetes mellitus   . Hypertension   . Osteomyelitis of right foot ([place]) 02/22/2017  . Schizophrenia ([place])   . Septic arthritis of interphalangeal joint of toe of left foot ([place]) 06/02/2016  . Stress incontinence 04/26/2017    Past Surgical History:  Procedure Laterality Date  . AMPUTATION Left 06/05/2016   Procedure: LEFT FOOT TRANSMETATARSAL AMPUTATION;  Surgeon: [name], MD;  Location: [facility];  Service: Orthopedics;  Laterality: Left;  . AMPUTATION Right 11/11/2017   Procedure: AMPUTATION BELOW KNEE;  Surgeon: [name], MD;  Location: [place];  Service: Orthopedics;  Laterality: Right;  . AMPUTATION Left 05/10/2018   Procedure: LEFT BELOW KNEE AMPUTATION;  Surgeon: [name], MD;  Location: [place];  Service: Orthopedics;  Laterality: Left;  . AMPUTATION Left 12/28/2018   Procedure: AMPUTATION THIRD DIGIT;  Surgeon: Iran Planas, MD;  Location: [facility];  Service: Orthopedics;  Laterality: Left;  . AMPUTATION TOE Right 02/23/2017   Procedure: AMPUTATION RIGHT THIRD TOE;  Surgeon: [name], MD;  Location: [place];  Service: Orthopedics;  Laterality: Right;  . I&D EXTREMITY Right 11/09/2017   Procedure: Debride Ulcer Right Heel;   Surgeon: [name], MD;  Location: [place];  Service: Orthopedics;  Laterality: Right;  . I&D EXTREMITY Left 12/28/2018   Procedure: IRRIGATION AND DEBRIDEMENT EXTREMITY;  Surgeon: Iran Planas, MD;  Location: [facility];  Service: Orthopedics;  Laterality: Left;  . INCISION AND DRAINAGE OF WOUND Left 12/26/2018   Procedure: IRRIGATION AND DEBRIDEMENT LEFT HAND;  Surgeon: Iran Planas, MD;  Location: [facility];  Service: Orthopedics;  Laterality: Left;  . LAPAROTOMY N/A 02/02/2019   Procedure: EXPLORATORY LAPAROTOMY WITH OVERSEW OF DUODENAL ULCER;  Surgeon: Alphonsa Overall, MD;  Location: [facility];  Service: General;  Laterality: N/A;  . TUBAL LIGATION      Allergies: Bee venom and Metformin and related  Medications: Prior to Admission medications   Medication Sig Start Date End Date Taking? Authorizing Provider  acetaminophen (TYLENOL) 325 MG tablet Take 2 tablets (650 mg total) by mouth every 6 (six) hours as needed for mild pain (or Fever >/= 101). 05/21/18  Yes Roney Jaffe, MD  escitalopram (LEXAPRO) 20 MG tablet TAKE ONE TABLET BY MOUTH EVERY DAY Patient taking differently: Take 20 mg by mouth daily.  04/09/18  Yes Charlott Rakes, MD  gabapentin (NEURONTIN) 100 MG capsule Take 2 capsules (200 mg total) by mouth 2 (two) times daily. 11/28/18 02/02/19 Yes Spongberg, Audie Pinto, MD  insulin aspart (NOVOLOG) 100 UNIT/ML injection Inject 0-15 Units into the skin 3 (three) times daily with meals. 05/21/18  Yes Roney Jaffe, MD  Insulin Glargine (LANTUS) 100 UNIT/ML Solostar Pen Inject 35 Units into the skin daily. 12/30/18  Yes  Dhungel, Nishant, MD  Multiple Vitamin (MULTIVITAMIN WITH MINERALS) TABS tablet Take 1 tablet by mouth daily.   Yes [provider]  amLODipine (NORVASC) 5 MG tablet Take 1 tablet (5 mg total) by mouth daily. 11/29/18 12/29/18  Spongberg, Audie Pinto, MD  dextrose 50 % solution Inject 25 mLs (12.5 g total) into the vein as needed for low blood sugar. Patient  not taking: Reported on 02/02/2019 05/21/18   Roney Jaffe, MD  doxycycline (VIBRA-TABS) 100 MG tablet Take 1 tablet (100 mg total) by mouth 2 (two) times daily. Patient not taking: Reported on 02/02/2019 12/30/18   Dhungel, Flonnie Overman, MD  polyethylene glycol (MIRALAX) packet Take 17 g by mouth daily as needed for mild constipation. Patient not taking: Reported on 02/02/2019 05/21/18   Roney Jaffe, MD  senna-docusate (SENOKOT-S) 8.6-50 MG tablet Take 2 tablets by mouth at bedtime as needed for mild constipation. Patient not taking: Reported on 02/02/2019 05/21/18   Roney Jaffe, MD     Family History  Problem Relation Age of Onset  . Diabetes type II Father   . CAD Father   . Prostate cancer Father   . Diabetes Mellitus II Brother     Social History   Socioeconomic History  . Marital status: Single    Spouse name: Not on file  . Number of children: Not on file  . Years of education: Not on file  . Highest education level: Not on file  Occupational History  . Not on file  Tobacco Use  . Smoking status: Never Smoker  . Smokeless tobacco: Never Used  Substance and Sexual Activity  . Alcohol use: No    Comment: occasionally  . Drug use: No  . Sexual activity: Not Currently  Other Topics Concern  . Not on file  Social History Narrative  . Not on file   Social Determinants of Health   Financial Resource Strain:   . Difficulty of Paying Living Expenses: Not on file  Food Insecurity:   . Worried About Charity fundraiser in the Last Year: Not on file  . Ran Out of Food in the Last Year: Not on file  Transportation Needs:   . Lack of Transportation (Medical): Not on file  . Lack of Transportation (Non-Medical): Not on file  Physical Activity:   . Days of Exercise per Week: Not on file  . Minutes of Exercise per Session: Not on file  Stress:   . Feeling of Stress : Not on file  Social Connections:   . Frequency of Communication with Friends and Family: Not on file    . Frequency of Social Gatherings with Friends and Family: Not on file  . Attends Religious Services: Not on file  . Active Member of Clubs or Organizations: Not on file  . Attends Archivist Meetings: Not on file  . Marital Status: Not on file     Review of Systems: A 12 point ROS discussed and pertinent positives are indicated in the HPI above.  All other systems are negative.  Review of Systems  Vital Signs: BP (!) 176/75   Pulse 92   Temp 99.6 F (37.6 C) (Oral)   Resp 12   Ht 5\' 2"  (1.575 m)   Wt 54.7 kg   LMP 03/29/2015   SpO2 100%   BMI 22.06 kg/m   Physical Exam Vitals reviewed.  Constitutional:      Appearance: She is ill-appearing.  HENT:     Head: Normocephalic and atraumatic.  Eyes:     Extraocular Movements: Extraocular movements intact.  Cardiovascular:     Rate and Rhythm: Normal rate and regular rhythm.  Pulmonary:     Effort: Pulmonary effort is normal. No respiratory distress.     Breath sounds: Normal breath sounds.  Abdominal:     Palpations: Abdomen is soft.       Comments: Purple = Surgical dressing Red = Drain in place  Musculoskeletal:        General: Normal range of motion.  Skin:    General: Skin is warm and dry.  Neurological:     General: No focal deficit present.     Mental Status: She is alert and oriented to person, place, and time.  Psychiatric:        Mood and Affect: Mood normal.        Behavior: Behavior normal.        Thought Content: Thought content normal.        Judgment: Judgment normal.     Imaging: CT ABDOMEN PELVIS WO CONTRAST  Result Date: 02/02/2019 CLINICAL DATA:  Abdominal pain. EXAM: CT ABDOMEN AND PELVIS WITHOUT CONTRAST TECHNIQUE: Multidetector CT imaging of the abdomen and pelvis was performed following the standard protocol without IV contrast. COMPARISON:  06/15/2013 FINDINGS: Lower chest: There is mild airspace disease at the lung bases bilaterally.The heart size is normal. The intracardiac  blood pool is hypodense relative to the adjacent myocardium consistent with anemia. Hepatobiliary: The liver is normal. Cholelithiasis without acute inflammation.There is no biliary ductal dilation. Pancreas: Normal contours without ductal dilatation. No peripancreatic fluid collection. Spleen: No splenic laceration or hematoma. Adrenals/Urinary Tract: --Adrenal glands: No adrenal hemorrhage. --Right kidney/ureter: No hydronephrosis or perinephric hematoma. --Left kidney/ureter: No hydronephrosis or perinephric hematoma. --Urinary bladder: The bladder is decompressed with a Foley catheter which limits evaluation. Stomach/Bowel: --Stomach/Duodenum: There is some wall thickening of the distal esophagus. The stomach is mildly distended. --Small bowel: There is pneumatosis involving multiple small bowel loops. The small bowel is mildly dilated without evidence for a high-grade obstruction. --Colon: No focal abnormality. --Appendix: Normal. Vascular/Lymphatic: There are minimal atherosclerotic changes of the abdominal aorta. Gas is noted within the SMV and draining mesenteric veins. --there are some prominent retroperitoneal lymph nodes that are suboptimally evaluated in the absence of IV contrast. --No mesenteric lymphadenopathy. --No pelvic or inguinal lymphadenopathy. Reproductive: Unremarkable Other: There is a large amount of free air within the abdomen. There is a moderate volume of somewhat complex free fluid in the abdomen. There is mild body wall edema. Musculoskeletal. No acute displaced fractures. IMPRESSION: 1. Findings consistent with a hollow viscus perforation as evidence by a large volume of free air and a moderate volume a complex free fluid throughout the abdomen. 2. 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 Gas is noted within the SMV and draining mesenteric veins. Findings are concerning for mesenteric ischemia 3. There is cholelithiasis without secondary signs of acute  cholecystitis. 4. Bibasilar airspace disease may represent atelectasis, pneumonia, or aspiration in the appropriate clinical setting. These results were called by telephone at the time of interpretation on 02/02/2019 at 9:25 pm to provider Sherwood Gambler , who verbally acknowledged these results. Electronically Signed   By: Constance Holster M.D.   On: 02/02/2019 21:42   CT ABDOMEN WO CONTRAST  Result Date: 02/10/2019 CLINICAL DATA:  Eval for perforation. EXAM: CT ABDOMEN WITHOUT CONTRAST TECHNIQUE: Multidetector CT imaging of the abdomen was performed following the standard protocol without IV contrast. COMPARISON:  Upper GI evaluation of the same date and CT abdomen and pelvis of 02/02/2019. FINDINGS: Lower chest: Basilar collapse/consolidation with small bilateral pleural effusions. No signs of pericardial effusion Hepatobiliary: Loculated appearing fluid along the a Paddock margin measures approximately 7 by 2.8 cm scalloping the liver margin. Limited assessment a left hepatic lobe due to extreme streak artifact from recently administered gastrointestinal contrast. Signs of cholelithiasis. Pancreas: Not well evaluated. No gross abnormality within the pancreatic bed. Spleen: Not well evaluated due to streak artifact. Adrenals/Urinary Tract: Normal adrenal glands. No signs of hydronephrosis. Stomach/Bowel: Dense contrast material administered during an upper GI procedure. Irregularity of the antral pyloric stomach compatible likely related to repair of gastric ulcer, surgical drain terminates anterior to the antro pyloric stomach and exits via right sub hepatic abdominal wall. There is a question of very subtle added density within the lumen of the drainage catheter. The density of material surrounding the stomach does not show definitive evidence of a leak however. Mixing of contrast, the small amount of contrast that did pass the level of the ulcer repair is noted within the lumen of distended small bowel  loops becoming more dilute distally. There are numerous distended small bowel loops. Since the previous study there has been resolution of the diffuse enteric pneumatosis. Small amount of fluid is seen along the anterior margin of the abdomen just deep to the abdominal wall, anterior to dilated loops, measuring approximately 8 mm in greatest thickness deep to the left rectus muscle (image 43, series 2. Vascular/Lymphatic: Calcified atherosclerotic changes throughout the abdominal aorta. No signs of adenopathy. Other: Perihepatic fluid with loculated appearance. Dilated loops of bowel described above, associated with mesenteric edema and a small amount of peritoneal fluid. Musculoskeletal: No signs of acute bone finding or evidence of destructive bone process. IMPRESSION: 1. No signs of gross leak; however, there is a question of very subtle added density within the lumen of the drain, raising the question of a tiny leak that cannot be confirmed by the presence of a collection of extraluminal contrast. Suggest close follow-up perhaps with repeat CT utilizing dilute water-soluble contrast administered via existing gastric tube after resolution of pyloric edema that is seen on today's study. This presumed edema restricts flow of the contrast despite oblique positioning of the patient during the fluoroscopic exam. 2. Interval resolution of the diffuse enteric pneumatosis now with diffuse presumed ileus. Continued attention on follow-up is suggested. 3. Postoperative perihepatic fluid collection with lenticular contour compatible with loculated fluid. 4. Small amount of fluid along the anterior margin of the abdomen just deep to the left rectus muscle, measuring approximately 8 mm in greatest thickness. 5. Basilar collapse/consolidation with small bilateral pleural effusions. 6. These results will be called to the ordering clinician or representative by the Radiologist Assistant, and communication documented in the PACS  or zVision Dashboard. Aortic Atherosclerosis (ICD10-I70.0). Electronically Signed   By: Zetta Bills M.D.   On: 02/10/2019 17:31   DG Abd 1 View  Result Date: 02/09/2019 CLINICAL DATA:  NG tube placement EXAM: ABDOMEN - 1 VIEW COMPARISON:  None. FINDINGS: Minimal bibasilar atelectasis. Enteric tube tip and side-port project over the stomach. Gaseous distended loops of small bowel within the central abdomen. IMPRESSION: Enteric tube tip and side-port project over the stomach. Electronically Signed   By: Lovey Newcomer M.D.   On: 02/09/2019 14:50   CT HEAD WO CONTRAST  Result Date: 02/12/2019 CLINICAL DATA:  Altered mental status EXAM: CT HEAD WITHOUT CONTRAST TECHNIQUE: Contiguous axial images  were obtained from the base of the skull through the vertex without intravenous contrast. COMPARISON:  02/02/2019 FINDINGS: Brain: No evidence of acute infarction, hemorrhage, hydrocephalus, extra-axial collection or mass lesion/mass effect. Vascular: No hyperdense vessel or unexpected calcification. Skull: Normal. Negative for fracture or focal lesion. Sinuses/Orbits: No acute finding. Other: None. IMPRESSION: No acute intracranial findings.  Stable exam from 02/02/2019. Electronically Signed   By: Davina Poke M.D.   On: 02/12/2019 14:59   CT Head Wo Contrast  Result Date: 02/02/2019 CLINICAL DATA:  Found on the floor at home.  Nausea and vomiting. EXAM: CT HEAD WITHOUT CONTRAST TECHNIQUE: Contiguous axial images were obtained from the base of the skull through the vertex without intravenous contrast. COMPARISON:  03/13/2018 FINDINGS: Brain: No evidence of acute infarction, hemorrhage, hydrocephalus, extra-axial collection or mass lesion/mass effect. Vascular: No hyperdense vessel or unexpected calcification. Skull: Normal. Negative for fracture or focal lesion. Sinuses/Orbits: Globes and orbits are unremarkable. Visualized sinuses and mastoid air cells are clear. Other: None. IMPRESSION: Normal enhanced CT  scan of the brain. Electronically Signed   By: Lajean Manes M.D.   On: 02/02/2019 21:38   CT ABDOMEN PELVIS W CONTRAST  Result Date: 02/25/2019 CLINICAL DATA:  Abdominal pain, status post gastric perforation repair, evaluate for abscess and the EXAM: CT ABDOMEN AND PELVIS WITH CONTRAST TECHNIQUE: Multidetector CT imaging of the abdomen and pelvis was performed using the standard protocol following bolus administration of intravenous contrast. CONTRAST:  125mL OMNIPAQUE IOHEXOL 300 MG/ML SOLN, additional enteric contrast COMPARISON:  02/18/2019 FINDINGS: Lower chest: Moderate right, small left pleural effusions. Right pleural effusion is enlarged compared to prior examination, left pleural effusion is decreased. Hepatobiliary: No solid liver abnormality is seen. Gallstone in the contracted gallbladder. Gallbladder wall thickening, unchanged compared to prior examination and nonspecific in the setting of ascites. Pancreas: Unremarkable. No pancreatic ductal dilatation or surrounding inflammatory changes. Spleen: Normal in size without significant abnormality. Adrenals/Urinary Tract: Adrenal glands are unremarkable. Kidneys are normal, without renal calculi, solid lesion, or hydronephrosis. Bladder is unremarkable. Stomach/Bowel: Diffuse thickening of the small bowel and colon (series 3, image 54). Rectal tubes. Vascular/Lymphatic: Aortic atherosclerosis. Mildly enlarged retroperitoneal lymph nodes. Reproductive: Fluid in the endometrial cavity, unchanged compared to prior examination although abnormal in the postmenopausal setting (series 3, image 64). Other: No abdominal wall hernia. Postoperative findings of midline laparotomy. Surgical drain is positioned in the lesser omentum. Anasarca. There is a persistent fluid collection about the ventral abdomen, which is decreased in size compared to prior examination and partially contrast opacified, suspect persistent leak. This collection measures 14.0 x 3.2 x 14.5  cm, previously 19.6 x 4.2 x 15.2 cm when measured similarly (series 3, image 41, 55, series 7, image 87). Unchanged, loculated fluid collection about the lateral aspect of the right lobe of the liver measuring 7.9 x 4.8 x 1.4 cm (series 3, image 21, series 6, image 45). Interval decrease in size of a loculated appearing fluid collection between the uterus and rectum, measuring 6.1 x 3.1 cm, previously 6.7 x 4.4 cm (series 3, image 66). Interval increase in size of fluid adjacent to the greater curvature of the stomach and spleen in the left upper quadrant, measuring 8.1 x 6.2 cm, previously 6.3 x 3.4 cm (series 3, image 22). Musculoskeletal: Sacral decubitus wound (series 3, image 76). IMPRESSION: 1. There is a persistent fluid collection about the ventral abdomen, which is decreased in size compared to prior examination and partially contrast opacified, suspect persistent leak. This collection measures  14.0 x 3.2 x 14.5 cm, previously 19.6 x 4.2 x 15.2 cm when measured similarly (series 3, image 41, 55, series 7, image 87). 2. Additional fluid collections about the abdomen and pelvis as detailed above, some decreased in size compared to prior examination and some increased, possibly communicating and fluctuant. 3. Surgical drain positioned in the lesser omentum. 4. Diffuse thickening of the small bowel and colon, likely reactive in the setting of peritonitis. 5. Cholelithiasis with gallbladder wall thickening, unchanged compared to prior examination and nonspecific in the setting of ascites. 6. Moderate right, small left pleural effusions. Right pleural effusion is enlarged compared to prior examination, although left pleural effusion is decreased. 7. Anasarca. 8. Fluid in the endometrial cavity, unchanged compared to prior examination although abnormal in the postmenopausal setting. 9.  Aortic Atherosclerosis (ICD10-I70.0). Electronically Signed   By: Eddie Candle M.D.   On: 02/25/2019 16:30   CT ABDOMEN PELVIS  W CONTRAST  Result Date: 02/18/2019 CLINICAL DATA:  Acute abdominal pain. Status post pre pyloric compared gastric ulcer perforation. EXAM: CT ABDOMEN AND PELVIS WITH CONTRAST TECHNIQUE: Multidetector CT imaging of the abdomen and pelvis was performed using the standard protocol following bolus administration of intravenous contrast. CONTRAST:  120mL OMNIPAQUE IOHEXOL 300 MG/ML  SOLN COMPARISON:  CT ABDOMEN 02/10/2019, UPPER GI 02/17/2019 FINDINGS: Lower chest: DENSE BIBASILAR ATELECTASIS WITH AIR BRONCHOGRAMS. SMALL BILATERAL PLEURAL EFFUSIONS. NO PULMONARY EDEMA. Hepatobiliary: Subcapsular fluid collection along the RIGHT hepatic lobe measures 5.5 by 1.3 cm and is smaller than comparison exam. Percutaneous drainage catheter enters the RIGHT abdominal wall adjacent to the RIGHT hepatic lobe. Catheter terminates adjacent the porta hepatis. No fluid collections associated with catheter. The gallbladder is collapsed around gallstones. No biliary duct dilatation. Pancreas: Pancreas is normal. No ductal dilatation. No pancreatic inflammation. Spleen: Normal spleen Adrenals/urinary tract: Adrenal glands are normal. Kidneys enhance symmetrically. There is contrast within mildly distended bladder. Small a gas bladder. Stomach/Bowel: NG tube extends the stomach. There is a large volume of high-density fluid within the ventral peritoneal space which appears associated with abnormal loops of small bowel. There is mild pneumatosis with the loops of small bowel (a image 47/3). The walls of the bowel are not well identified. This high-density collection which is favored to be within the loops small bowel suggest flaccid bowel with limited peristalsis resulting in a matted collection of contrast. Contrast does flow into the distal small bowel bowel and colon. Vascular/Lymphatic: Abdominal aorta is normal caliber. No periportal or retroperitoneal adenopathy. No pelvic adenopathy. Reproductive: Endometrium is mildly expanded to 8  mm Other: There several pockets of intraperitoneal free fluid with enhancing rim. 1 pocket is posterior to the uterus measuring 5.4 x 3.5 cm. Similar smaller pocket in the peritoneal space ventrally on the LEFT measures 1.5 x 2.5 cm sagittal image 90/126. Musculoskeletal: No aggressive osseous lesion. IMPRESSION: 1. Stasis of oral contrast within the mid small bowel which floats in the central abdomen. The loops of bowel are difficult to discern from each and there is pneumatosis within several loops of bowel. Findings concerning for flaccid bowel/ischemic bowel. Recommend clinical correlation and surgical consultation. 2. The above contrast collection in the ventral abdomen is not favored to be related to gastric perforation as there is no high-density intraperitoneal fluid elsewhere and no significant intraperitoneal free air. 3. Several pockets of fluid within the peritoneal space with thin enhancing rims consistent with peritonitis. 4. Reduction in subcapsular fluid collection along the RIGHT hepatic lobe which may represent a biloma. 5.  Dense bibasilar atelectasis. 1.  Findings conveyed toKELLY OSBORNE on 02/18/2019  at17:24. Electronically Signed   By: Suzy Bouchard M.D.   On: 02/18/2019 17:30   DG CHEST PORT 1 VIEW  Result Date: 02/17/2019 CLINICAL DATA:  Acute respiratory distress, hypertension, diabetes mellitus, post breathing treatment EXAM: PORTABLE CHEST 1 VIEW COMPARISON:  Portable exam 1648 hours compared to 02/06/2019 FINDINGS: RIGHT arm PICC line tip projects over confluence of SVC. Nasogastric tube extends into stomach. Retained contrast in stomach and proximal jejunum. Normal heart size, mediastinal contours, and pulmonary vascularity. Bibasilar atelectasis versus infiltrate, greater on LEFT. Upper lungs clear. No pleural effusion or pneumothorax. Osseous structures unremarkable. IMPRESSION: Bibasilar atelectasis versus infiltrate greater on LEFT. Electronically Signed   By: Lavonia Dana M.D.    On: 02/17/2019 16:57   DG Chest Port 1 View  Result Date: 02/06/2019 CLINICAL DATA:  Acute respiratory failure EXAM: PORTABLE CHEST 1 VIEW COMPARISON:  Radiograph 02/04/2019 FINDINGS: Right IJ catheter tip terminates in the lower SVC. Transesophageal tube tip and side port distal to the GE junction, curling in the left upper quadrant. Endotracheal tube terminates in the mid trachea, 3 cm from the carina. Basilar gradient opacity may reflect some atelectasis or developing pleural fluid. There is a bandlike opacity in the right mid lung likely reflecting subsegmental atelectasis. Mild pulmonary venous congestion is similar to priors. IMPRESSION: 1. Stable satisfactory positioning of lines and tubes. 2. Basilar gradient opacity may reflect some worsening atelectasis or developing pleural fluid. 3. Bandlike opacity in the right lung base possibly subsegmental atelectasis. Electronically Signed   By: Lovena Le M.D.   On: 02/06/2019 05:11   DG Chest Port 1 View  Result Date: 02/04/2019 CLINICAL DATA:  Shortness of breath. EXAM: PORTABLE CHEST 1 VIEW COMPARISON:  One-view chest x-ray 02/03/2019 FINDINGS: The heart size is normal. Endotracheal tube is stable. NG tube terminates in the stomach. Right IJ line is stable. Aeration in lung volumes are slightly improved. Mild pulmonary vascular congestion remains. IMPRESSION: 1. Slight improved aeration with persistent mild pulmonary vascular congestion. 2. Support apparatus is stable. Electronically Signed   By: San Morelle M.D.   On: 02/04/2019 08:36   DG Chest Port 1 View  Result Date: 02/03/2019 CLINICAL DATA:  ET and NG placement EXAM: PORTABLE CHEST 1 VIEW COMPARISON:  Radiograph 02/02/2019 FINDINGS: Endotracheal tube terminates in the mid trachea, 4.5 cm from the carina. Transesophageal tube tip and side port are distal to the GE junction curling in the left upper quadrant. Right IJ approach catheter sheath tip terminates at the superior  cavoatrial junction. Lung volumes are low with some streaky atelectatic changes. Cardiomediastinal contours are unremarkable for portable technique. No acute osseous or soft tissue abnormality. IMPRESSION: 1. Endotracheal tube terminates in the mid trachea, 4.5 cm from the carina. 2. Transesophageal tube tip and side port are distal to the GE junction. 3. Right IJ catheter terminates at the superior cavoatrial junction. 4. Basilar atelectasis otherwise no acute cardiopulmonary abnormality. Electronically Signed   By: Lovena Le M.D.   On: 02/03/2019 03:35   DG Chest Port 1 View  Result Date: 02/02/2019 CLINICAL DATA:  Patient arrived by EMS from home. Pt was found on the floor at home. Pt has N/V. Patient is urinary and fecal incontinent. Hx of DM and HTN. EXAM: PORTABLE CHEST 1 VIEW COMPARISON:  04/01/2016 FINDINGS: Cardiac silhouette is normal in size. No mediastinal or hilar masses. Lung volumes are low. Lungs are clear. No pleural effusion or pneumothorax. Skeletal  structures are grossly intact. IMPRESSION: No active disease. Electronically Signed   By: Lajean Manes M.D.   On: 02/02/2019 19:56   DG [place] Dys Tube Plc W/Fl W/Rad  Result Date: 02/17/2019 CLINICAL DATA:  Nasogastric tube placement. Recent gastric ulcer repair. Evaluate for leak. EXAM: NASO G TUBE PLACEMENT WITH FL AND WITH RAD; DG UGI W SINGLE CM CONTRAST:  154mL OMNIPAQUE IOHEXOL 300 MG/ML  SOLN FLUOROSCOPY TIME:  Fluoroscopy Time:  1 minutes 6 seconds Radiation Exposure Index (if provided by the fluoroscopic device): 11 mGy Number of Acquired Spot Images: 2 COMPARISON:  Upper GI 02/10/2019 and CT abdomen pelvis 02/10/2019. FINDINGS: Nasogastric tube was placed under fluoroscopic guidance with the tip positioned in the gastric cardia. Subsequently, 180 cc Omnipaque 300 was hand injected. No leak. Attempts were made to rotate the patient to help move contrast from the stomach into the proximal small bowel but, due to pain, she was not able  to do so. No leak within the opacified portions of the stomach. IMPRESSION: 1. Successful nasogastric tube placement under fluoroscopy. 2. Limited exam of the upper gastrointestinal tract without a leak in the opacified portions of the stomach. Please see discussion above. Electronically Signed   By: Lorin Picket M.D.   On: 02/17/2019 14:49   DG UGI W SINGLE CM (SOL OR THIN BA)  Result Date: 02/17/2019 CLINICAL DATA:  Nasogastric tube placement. Recent gastric ulcer repair. Evaluate for leak. EXAM: NASO G TUBE PLACEMENT WITH FL AND WITH RAD; DG UGI W SINGLE CM CONTRAST:  164mL OMNIPAQUE IOHEXOL 300 MG/ML  SOLN FLUOROSCOPY TIME:  Fluoroscopy Time:  1 minutes 6 seconds Radiation Exposure Index (if provided by the fluoroscopic device): 11 mGy Number of Acquired Spot Images: 2 COMPARISON:  Upper GI 02/10/2019 and CT abdomen pelvis 02/10/2019. FINDINGS: Nasogastric tube was placed under fluoroscopic guidance with the tip positioned in the gastric cardia. Subsequently, 180 cc Omnipaque 300 was hand injected. No leak. Attempts were made to rotate the patient to help move contrast from the stomach into the proximal small bowel but, due to pain, she was not able to do so. No leak within the opacified portions of the stomach. IMPRESSION: 1. Successful nasogastric tube placement under fluoroscopy. 2. Limited exam of the upper gastrointestinal tract without a leak in the opacified portions of the stomach. Please see discussion above. Electronically Signed   By: Lorin Picket M.D.   On: 02/17/2019 14:49   DG UGI W SINGLE CM (SOL OR THIN BA)  Result Date: 02/10/2019 CLINICAL DATA:  History of perforated gastric ulcer. EXAM: WATER SOLUBLE UPPER GI SERIES TECHNIQUE: Single-column upper GI series was performed using water soluble contrast. CONTRAST:  100 cc Omnipaque administered per existing G-tube period. COMPARISON:  CT abdomen and pelvis from 02/02/2019. FLUOROSCOPY TIME:  Fluoroscopy Time:  2 minutes 18 seconds  Radiation Exposure Index (if provided by the fluoroscopic device): 20.4 mGy Number of Acquired Spot Images: 4 FINDINGS: Scout image obtained shows a G-tube coiled in the stomach and a surgical drain entering the image from the right likely terminating in the area around the repair site. Bowel distension in the upper abdomen is noted, small bowel loops reaching caliber near the 2-1/2 to 3 cm. Slow injection of iodinated contrast material pooled in the stomach, confined mainly to the fundus and body of stomach despite attempts at oblique positioning. This was complicated by the patient's debilitated state and recent surgery. Limited contrast passed through the pyloric channel. Irregularity of this channel likely relates  to the repair and 2 significant edema in the area. IMPRESSION: 1. Presumed bowel edema following repair, limiting flow of contrast from the stomach into the small bowel, for this reason the exam the subsequent CT or limited with respect to detection of small leaks. There is no gross leak on the images which were obtained for today's study. 2. The patient was taken to CT following this procedure in an attempt to localize smaller amounts of I had and 80 contrast which might be present but not detectable by fluoroscopy. Please see dedicated report for further detail. 3. Signs of presumed ileus.  Suggest attention on follow-up. Electronically Signed   By: Zetta Bills M.D.   On: 02/10/2019 17:36   [place] EKG SITE RITE  Result Date: 02/13/2019 If Site Rite image not attached, placement could not be confirmed due to current cardiac rhythm.   Labs:  CBC: Recent Labs    02/22/19 0203 02/24/19 0206 02/24/19 1044 02/25/19 0547 02/26/19 0500  WBC 11.6* 13.2*  --  12.4* 11.7*  HGB 8.1* 7.0* 7.0* 7.0* 6.8*  HCT 26.5* 23.7* 23.2* 22.7* 22.7*  PLT 376 534*  --  540* 628*    COAGS: Recent Labs    02/02/19 1832 02/02/19 1952  INR 1.3* 1.2  APTT  --  27    BMP: Recent Labs     02/22/19 0203 02/23/19 1040 02/24/19 0206 02/25/19 0547  NA 137 138 139 139  K 4.3 4.5 4.6 4.6  CL 108 108 108 108  CO2 20* 25 23 25   GLUCOSE 162* 118* 129* 137*  BUN 36* 43* 42* 44*  CALCIUM 8.7* 8.6* 8.7* 8.7*  CREATININE 0.84 0.72 0.72 0.67  GFRNONAA >60 >60 >60 >60  GFRAA >60 >60 >60 >60    LIVER FUNCTION TESTS: Recent Labs    02/21/19 1110 02/22/19 0203 02/24/19 0206 02/25/19 0547  BILITOT 0.5 0.3 0.2* 0.5  AST 16 18 26 22   ALT 10 12 21 19   ALKPHOS 104 142* 194* 206*  PROT 6.0* 6.0* 6.4* 6.4*  ALBUMIN 1.1* 1.1* 1.2* 1.2*    TUMOR MARKERS: No results for input(s): AFPTM, CEA, CA199, CHROMGRNA in the last 8760 hours.  Assessment and Plan:  S/P Ex lap withversew of duodenal ulcer,Graham patch 11/23 byDr. Lucia Gaskins for perforated prepyloric ulcer with gross contamination.  Persistent leak noted on CT scan with several fluid collections in the ventral abdomen and in pelvis.    Will proceed with image guided placement of a drain today by Dr.Hassell.  Risks and benefits discussed with the patient including bleeding, infection, damage to adjacent structures, bowel perforation/fistula connection, and sepsis.  All of the patient's questions were answered, patient is agreeable to proceed. Consent signed and in chart.  Thank you for this interesting consult.  I greatly enjoyed meeting Gina [name] and look forward to participating in their care.  A copy of this report was sent to the requesting provider on this date.  Electronically Signed: Murrell Redden, PA-C   02/26/2019, 10:59 AM      I spent a total of 40 Minutes   in face to face in clinical consultation, greater than 50% of which was counseling/coordinating care for abscess drain

## 2019-02-26 NOTE — Procedures (Signed)
  Procedure: CT guided LLQ ant peritoneal drain catheter placement 37ml brown opaque aspirate, sent for GS, C&S EBL:   minimal Complications:  none immediate  See full dictation in [facility].  Dillard Cannon MD Main # [phone] Pager  [phone]

## 2019-02-26 NOTE — Progress Notes (Signed)
PHARMACY - TOTAL PARENTERAL NUTRITION CONSULT NOTE   Indication: Small bowel obstruction  Patient Measurements: Height: 5' 2" (157.5 cm) Weight: 120 lb 9.5 oz (54.7 kg) IBW/kg (Calculated) : 50.1 TPN AdjBW (KG): 57.9 Body mass index is 22.06 kg/m. Usual Weight: 55-60 kg Ideal Body Weight:  44.2 kg(Adjusted IBW for bilateral BKA)  Assessment:  58 yoF with poorly controlled DM s/p BL BKA, admitted 11/23 with AMS, n/v, abdominal pain. CT abdomen showed pneumoperitoneum and mesenteric ischemia. Underwent ex lap on 11/23 with finding of perforated prepyloric ulcer which was oversewn, along with gross contamination of the peritoneum. Hospital course complicated by DKA, AKI, acute thrombocytopenia, and C. glabrata candidemia. Patient remains with high NGT d/t bowel edema as demonstrated on UGI. TPN started on 11/30 but was stopped shortly thereafter to facilitate central line holiday for candidemia. Repeat blood cultures remain negative to date, and TPN now restarting with replacement of central line. Patient at risk for refeeding given prolonged lack of nutrition.  Glucose / Insulin: Hx DM; prev required D10 W infusion to maintain euglycemia  Current resistant Novolog scale q6h (12/5)   CBGs ( goal <150)  range 104 - 154, 7 units of Novolog  past 24 hrs, 20 units of regular insulin added to TPN  Electrolytes: no labs today, yesterday WNL, corrected Ca 10.94 Renal: AKI on admit, SCr WNL  LFTs / TGs: Alk phos up to 206, Trig 141(22/14) Prealbumin / albumin: both low (11/30), 5.2 (12/4). 5.3 ( 12/7)  7.7 (12/14),  I/O: 950 ml from JP drain,  750 ml UOP,  75 ml NGO, 0 ml stool MIVF: 1/2 NS at 10 ml/hr GI Imaging: 11/30 UGI: high NG output & bowel edema 12/15 CTpersistent leak noted on CT scan with several fluid collections in the ventral abdomen and in pelvis.  Surgeries / Procedures: 11/22 ex lap: oversew of duodenal ulcer with Graham patch  Central access: PICC replaced 12/3  TPN start  date: resumed 12/3 (~ 2200)  Nutritional Goals (per RD recommendation on 12/11): Kcal:  2025-2330 kcal Protein:  115-130 grams Fluid:  >/= 2 L/day Goal TPN rate is 90 mL/hr (provides 129 g of protein and 2047 kcals per day) - GIR 3.3 mg/kg/min Current Nutrition:  NPO Plan: at 1800  Continue custom TPN at 90 mL/hr, the target rate   Electrolytes in TPN:  50mEq/L Na, 25 mEq/L K, 3 mEq/L Ca, Magnesium 10 mEq/L , and 10 mmol/L of Phos ; continue Cl:Ac ratio 1:2  Standard MVI TPN on MWF only d/t national shortage. Daily trace elements  Continue SSI to resistant with q6h CBG checks and adjust as needed   Continue 20 units of Novolog to TPN  Continue 1/2 NS at KVO    Monitor TPN labs on Mon/Thurs   T. , Pharm.D [phone] 02/26/2019 8:46 AM      

## 2019-02-27 DIAGNOSIS — R188 Other ascites: Secondary | ICD-10-CM

## 2019-02-27 DIAGNOSIS — K651 Peritoneal abscess: Secondary | ICD-10-CM

## 2019-02-27 LAB — GLUCOSE, CAPILLARY
Glucose-Capillary: 136 mg/dL — ABNORMAL HIGH (ref 70–99)
Glucose-Capillary: 137 mg/dL — ABNORMAL HIGH (ref 70–99)
Glucose-Capillary: 134 mg/dL — ABNORMAL HIGH (ref 70–99)
Glucose-Capillary: 130 mg/dL — ABNORMAL HIGH (ref 70–99)

## 2019-02-27 LAB — MAGNESIUM: Magnesium: 2 mg/dL (ref 1.7–2.4)

## 2019-02-27 LAB — COMPREHENSIVE METABOLIC PANEL
ALT: 23 U/L (ref 0–44)
AST: 25 U/L (ref 15–41)
Albumin: 1.2 g/dL — ABNORMAL LOW (ref 3.5–5.0)
Alkaline Phosphatase: 275 U/L — ABNORMAL HIGH (ref 38–126)
Anion gap: 6 (ref 5–15)
BUN: 42 mg/dL — ABNORMAL HIGH (ref 6–20)
CO2: 27 mmol/L (ref 22–32)
Calcium: 8.9 mg/dL (ref 8.9–10.3)
Chloride: 106 mmol/L (ref 98–111)
Creatinine, Ser: 0.66 mg/dL (ref 0.44–1.00)
GFR calc Af Amer: >60 mL/min (ref >60)
GFR calc non Af Amer: >60 mL/min (ref >60)
Glucose, Bld: 150 mg/dL — ABNORMAL HIGH (ref 70–99)
Potassium: 4.8 mmol/L (ref 3.5–5.1)
Sodium: 139 mmol/L (ref 135–145)
Total Bilirubin: 0.3 mg/dL (ref 0.3–1.2)
Total Protein: 6.7 g/dL (ref 6.5–8.1)

## 2019-02-27 LAB — PHOSPHORUS: Phosphorus: 3.5 mg/dL (ref 2.5–4.6)

## 2019-02-27 MED ORDER — SODIUM CHLORIDE 0.9 % IV SOLN
200.0000 mg | Freq: Once | INTRAVENOUS | Status: AC
  Administered 2019-02-27: 200 mg via INTRAVENOUS
  Filled 2019-02-27: qty 200

## 2019-02-27 MED ORDER — SODIUM CHLORIDE 0.9 % IV SOLN
100.0000 mg | INTRAVENOUS | Status: DC
  Administered 2019-02-28 – 2019-03-09 (×10): 100 mg via INTRAVENOUS
  Filled 2019-02-27 (×11): qty 100

## 2019-02-27 MED ORDER — PHENOL 1.4 % MT LIQD
1.0000 | OROMUCOSAL | Status: DC | PRN
  Administered 2019-02-27: 1 via OROMUCOSAL
  Filled 2019-02-27: qty 177

## 2019-02-27 MED ORDER — MENTHOL 3 MG MT LOZG
1.0000 | LOZENGE | OROMUCOSAL | Status: DC | PRN
  Filled 2019-02-27: qty 9

## 2019-02-27 MED ORDER — TRAVASOL 10 % IV SOLN
INTRAVENOUS | Status: AC
  Filled 2019-02-27: qty 1296

## 2019-02-27 NOTE — Progress Notes (Signed)
[facility] for Infectious Disease   Reason for visit: Follow up on perforated gastric ulcer and C glabrata   Interval History:  Afebrile.  Opens eyes but does not respond much.  No acute events.  Restarted anidulafungin with positive yeast in abscess drainage.   Continues on Zosyn.     Physical Exam: Constitutional:  Vitals:   02/27/19 0800 02/27/19 1200  BP: (!) 130/56 (!) 150/69  Pulse: 82 94  Resp: 15 10  Temp: 97.9 F (36.6 C)   SpO2: 96% 98%   patient appears in NAD, sleeping but arousable; chronically ill appearing Eyes: anicteric HENT: +NGT Respiratory: Normal respiratory effort; CTA B Cardiovascular: RRR  Review of Systems: Constitutional: negative for chills, no fever  Lab Results  Component Value Date   WBC 11.7 (H) 02/26/2019   HGB 8.7 (L) 02/26/2019   HCT 28.1 (L) 02/26/2019   MCV 96.2 02/26/2019   PLT 628 (H) 02/26/2019    Lab Results  Component Value Date   CREATININE 0.66 02/27/2019   BUN 42 (H) 02/27/2019   NA 139 02/27/2019   K 4.8 02/27/2019   CL 106 02/27/2019   CO2 27 02/27/2019    Lab Results  Component Value Date   ALT 23 02/27/2019   AST 25 02/27/2019   ALKPHOS 275 (H) 02/27/2019     Microbiology: Recent Results (from the past 240 hour(s))  Culture, blood (routine x 2)     Status: None   Collection Time: 02/18/19  5:54 PM   Specimen: BLOOD LEFT ARM  Result Value Ref Range Status   Specimen Description   Final    BLOOD LEFT ARM Performed at [facility], [place] [address] [place], [place] [address]    Special Requests   Final    BOTTLES DRAWN AEROBIC AND ANAEROBIC Blood Culture adequate volume Performed at [facility] [address], [place], [place] [address]    Culture   Final    NO GROWTH 5 DAYS Performed at [facility], [place] [address], [place], [place] [address]    Report Status 02/24/2019 FINAL  Final  Culture, blood (routine x 2)     Status: None   Collection  Time: 02/18/19  6:07 PM   Specimen: BLOOD LEFT ARM  Result Value Ref Range Status   Specimen Description   Final    BLOOD LEFT ARM Performed at [place] [address], [place], [place] [address]    Special Requests   Final    BOTTLES DRAWN AEROBIC AND ANAEROBIC Blood Culture adequate volume Performed at [place] [address] [place], [place] [address]    Culture   Final    NO GROWTH 5 DAYS Performed at [facility], [place] [address], [place], [place] [address]    Report Status 02/24/2019 FINAL  Final  Aerobic/Anaerobic Culture (surgical/deep wound)     Status: None (Preliminary result)   Collection Time: 02/26/19  3:31 PM   Specimen: Abscess  Result Value Ref Range Status   Specimen Description   Final    ABSCESS LLQ CT DRAIN CATHETER Performed at [facility], [place] [address] [place], [place] [address]    Special Requests   Final    Normal Performed at [facility], [place] [address], [place], [place] [address]    Gram Stain   Final    MODERATE WBC PRESENT,BOTH PMN AND MONONUCLEAR FEW YEAST  Culture   Final    CULTURE REINCUBATED FOR BETTER GROWTH Performed at [facility], [place] [address] [place], [place] [address]    Report Status PENDING  Incomplete    Impression/Plan:  1. Fungemia - C glabrata.  Now again with yeast in culture and anidulafungin restarted.    2. Intra abdominal infection -CT still with a large abscess, new catheter placed yesterday with yeast noted.   Continue zosyn along with anidulafungin.

## 2019-02-27 NOTE — Progress Notes (Signed)
PHARMACY - TOTAL PARENTERAL NUTRITION CONSULT NOTE   Indication: Small bowel obstruction  Patient Measurements: Height: _0  (157.5 cm) Weight: 117 lb 4.6 oz (53.2 kg) IBW/kg (Calculated) : 50.1 TPN AdjBW (KG): 57.9 Body mass index is 21.45 kg/m. Usual Weight: 55-60 kg Ideal Body Weight:  44.2 kg(Adjusted IBW for bilateral BKA)  Assessment:  73 yoF with poorly controlled DM s/p BL BKA, admitted 11/23 with AMS, n/v, abdominal pain. CT abdomen showed pneumoperitoneum and mesenteric ischemia. Underwent ex lap on 11/23 with finding of perforated prepyloric ulcer which was oversewn, along with gross contamination of the peritoneum. Hospital course complicated by DKA, AKI, acute thrombocytopenia, and C. glabrata candidemia. Patient remains with high NGT d/t bowel edema as demonstrated on UGI. TPN started on 11/30 but was stopped shortly thereafter to facilitate central line holiday for candidemia. Repeat blood cultures remain negative to date, and TPN now restarting with replacement of central line. Patient at risk for refeeding given prolonged lack of nutrition.  Glucose / Insulin: Hx DM; prev required D10 W infusion to maintain euglycemia  Current resistant Novolog scale q6h (12/5)   CBGs ( goal <150)  range 104 - 164, 14 units of Novolog  past 24 hrs, 20 units of regular insulin added to TPN  Electrolytes: WNL, corrected Ca 11.14 Renal: AKI on admit, SCr WNL  LFTs / TGs: Alk phos up to 275, Trig 141(22/14) Prealbumin / albumin: both low (11/30), 5.2 (12/4). 5.3 ( 12/7)  7.7 (12/14),  I/O: 1350 ml from JP drain,  400 ml UOP,  25 ml NGO, 0 ml stool MIVF: 1/2 NS at 10 ml/hr GI Imaging: 11/30 UGI: high NG output & bowel edema 12/15 CTpersistent leak noted on CT scan with several fluid collections in the ventral abdomen and in pelvis.  Surgeries / Procedures: 11/22 ex lap: oversew of duodenal ulcer with [name] patch 12/16 IR placed drain into ant abd collection  Central access: PICC  replaced 12/3  TPN start date: resumed 12/3 (~ 2200)  Nutritional Goals (per RD recommendation on 12/11): Kcal:  2025-2330 kcal Protein:  115-130 grams Fluid:  >/= 2 L/day Goal TPN rate is 90 mL/hr (provides 129 g of protein and 2047 kcals per day) - GIR 3.3 mg/kg/min Current Nutrition:  NPO Plan: at 1800  Continue custom TPN at 90 mL/hr, the target rate   Electrolytes in TPN:  72mq/L Na, 25 mEq/L K, 3 mEq/L Ca, Magnesium 10 mEq/L , and 10 mmol/L of Phos ; continue Cl:Ac ratio 1:2  Standard MVI TPN on MWF only d/t nProducer, television/film/video Daily trace elements  Continue SSI to resistant with q6h CBG checks and adjust as needed   Continue 20 units of Novolog to TPN  [facility]   Monitor TPN labs on Mon/Thurs  MEudelia Bunch Pharm.D [phone] 02/27/2019 8:44 AM

## 2019-02-27 NOTE — TOC Progression Note (Signed)
Transition of Care [facility]) - Progression Note    Patient Details  Name: Gina [name] MRN: [id number] Date of Birth: [birth year]  Transition of Care [facility]) CM/SW Contact  Dia Jefferys, Marjie Skiff, RN Phone Number: 02/27/2019, 9:57 AM  Clinical Narrative:    TOC still waiting on auth approval for LTACH. TOC will update as soon as decision made.       Readmission Risk Interventions Readmission Risk Prevention Plan 02/10/2019 11/07/2017  Post Dischage Appt - Patient refused  Medication Screening - Complete  Transportation Screening Complete Complete  PCP follow-up - Patient refused  PCP or Specialist Appt within 3-5 Days Not Complete -  Not Complete comments DC date unknown- pt established with PCP -  [place] or Home Care Consult Complete -  Social Work Consult for [place] Planning/Counseling Not Complete -  SW consult not completed comments awaiting call back from daughter and APS -  Palliative Care Screening Not Complete -  Palliative Care Screening Not Complete Comments pending need -  Medication Review ([place]) Referral to Pharmacy -  Some recent data might be hidden

## 2019-02-27 NOTE — Progress Notes (Signed)
Referring Physician(s): Saverio Danker ([place])  Supervising Physician: Markus Daft  Patient Status:  [facility] - In-pt  Chief Complaint: None  Subjective:  History of perforated duodenal ulcer s/p exploratory laparotomy with oversew of duodenal ulcer in OR 02/03/2019 by Dr. Lucia Gaskins; with subsequent development of peritoneal abscess s/p LLQ anterior peritoneal drain placement in IR 02/26/2019 by Dr. Vernard Gambles. Patient laying in bed resting comfortably. She opens eyes to voice but immediately closes them. Does not converse. LLQ anterior peritoneal drain site c/d/i.   Allergies: Bee venom and Metformin and related  Medications: Prior to Admission medications   Medication Sig Start Date End Date Taking? Authorizing Provider  acetaminophen (TYLENOL) 325 MG tablet Take 2 tablets (650 mg total) by mouth every 6 (six) hours as needed for mild pain (or Fever >/= 101). 05/21/18  Yes Roney Jaffe, MD  escitalopram (LEXAPRO) 20 MG tablet TAKE ONE TABLET BY MOUTH EVERY DAY Patient taking differently: Take 20 mg by mouth daily.  04/09/18  Yes Charlott Rakes, MD  gabapentin (NEURONTIN) 100 MG capsule Take 2 capsules (200 mg total) by mouth 2 (two) times daily. 11/28/18 02/02/19 Yes Spongberg, Audie Pinto, MD  insulin aspart (NOVOLOG) 100 UNIT/ML injection Inject 0-15 Units into the skin 3 (three) times daily with meals. 05/21/18  Yes Roney Jaffe, MD  Insulin Glargine (LANTUS) 100 UNIT/ML Solostar Pen Inject 35 Units into the skin daily. 12/30/18  Yes Dhungel, Nishant, MD  Multiple Vitamin (MULTIVITAMIN WITH MINERALS) TABS tablet Take 1 tablet by mouth daily.   Yes [provider]  amLODipine (NORVASC) 5 MG tablet Take 1 tablet (5 mg total) by mouth daily. 11/29/18 12/29/18  Spongberg, Audie Pinto, MD  dextrose 50 % solution Inject 25 mLs (12.5 g total) into the vein as needed for low blood sugar. Patient not taking: Reported on 02/02/2019 05/21/18   Roney Jaffe, MD  doxycycline  (VIBRA-TABS) 100 MG tablet Take 1 tablet (100 mg total) by mouth 2 (two) times daily. Patient not taking: Reported on 02/02/2019 12/30/18   Dhungel, Flonnie Overman, MD  polyethylene glycol (MIRALAX) packet Take 17 g by mouth daily as needed for mild constipation. Patient not taking: Reported on 02/02/2019 05/21/18   Roney Jaffe, MD  senna-docusate (SENOKOT-S) 8.6-50 MG tablet Take 2 tablets by mouth at bedtime as needed for mild constipation. Patient not taking: Reported on 02/02/2019 05/21/18   Roney Jaffe, MD     Vital Signs: BP (!) 150/69 (BP Location: Left Arm)   Pulse 94   Temp 97.9 F (36.6 C) (Oral)   Resp 10   Ht 5\' 2"  (1.575 m)   Wt 117 lb 4.6 oz (53.2 kg)   LMP 03/29/2015   SpO2 98%   BMI 21.45 kg/m   Physical Exam Vitals and nursing note reviewed.  Constitutional:      General: She is not in acute distress. Pulmonary:     Effort: Pulmonary effort is normal. No respiratory distress.  Abdominal:     Comments: LLQ anterior peritoneal drain site without tenderness, erythema, drainage, or active bleeding; approximately 10-20 cc clear brown fluid with debris in gravity bag; drain flushes/aspirates without resistance.  Skin:    General: Skin is warm and dry.     Imaging: CT ABDOMEN PELVIS W CONTRAST  Result Date: 02/25/2019 CLINICAL DATA:  Abdominal pain, status post gastric perforation repair, evaluate for abscess and the EXAM: CT ABDOMEN AND PELVIS WITH CONTRAST TECHNIQUE: Multidetector CT imaging of the abdomen and pelvis was performed using the standard protocol following  bolus administration of intravenous contrast. CONTRAST:  119mL OMNIPAQUE IOHEXOL 300 MG/ML SOLN, additional enteric contrast COMPARISON:  02/18/2019 FINDINGS: Lower chest: Moderate right, small left pleural effusions. Right pleural effusion is enlarged compared to prior examination, left pleural effusion is decreased. Hepatobiliary: No solid liver abnormality is seen. Gallstone in the contracted  gallbladder. Gallbladder wall thickening, unchanged compared to prior examination and nonspecific in the setting of ascites. Pancreas: Unremarkable. No pancreatic ductal dilatation or surrounding inflammatory changes. Spleen: Normal in size without significant abnormality. Adrenals/Urinary Tract: Adrenal glands are unremarkable. Kidneys are normal, without renal calculi, solid lesion, or hydronephrosis. Bladder is unremarkable. Stomach/Bowel: Diffuse thickening of the small bowel and colon (series 3, image 54). Rectal tubes. Vascular/Lymphatic: Aortic atherosclerosis. Mildly enlarged retroperitoneal lymph nodes. Reproductive: Fluid in the endometrial cavity, unchanged compared to prior examination although abnormal in the postmenopausal setting (series 3, image 64). Other: No abdominal wall hernia. Postoperative findings of midline laparotomy. Surgical drain is positioned in the lesser omentum. Anasarca. There is a persistent fluid collection about the ventral abdomen, which is decreased in size compared to prior examination and partially contrast opacified, suspect persistent leak. This collection measures 14.0 x 3.2 x 14.5 cm, previously 19.6 x 4.2 x 15.2 cm when measured similarly (series 3, image 41, 55, series 7, image 87). Unchanged, loculated fluid collection about the lateral aspect of the right lobe of the liver measuring 7.9 x 4.8 x 1.4 cm (series 3, image 21, series 6, image 45). Interval decrease in size of a loculated appearing fluid collection between the uterus and rectum, measuring 6.1 x 3.1 cm, previously 6.7 x 4.4 cm (series 3, image 66). Interval increase in size of fluid adjacent to the greater curvature of the stomach and spleen in the left upper quadrant, measuring 8.1 x 6.2 cm, previously 6.3 x 3.4 cm (series 3, image 22). Musculoskeletal: Sacral decubitus wound (series 3, image 76). IMPRESSION: 1. There is a persistent fluid collection about the ventral abdomen, which is decreased in size  compared to prior examination and partially contrast opacified, suspect persistent leak. This collection measures 14.0 x 3.2 x 14.5 cm, previously 19.6 x 4.2 x 15.2 cm when measured similarly (series 3, image 41, 55, series 7, image 87). 2. Additional fluid collections about the abdomen and pelvis as detailed above, some decreased in size compared to prior examination and some increased, possibly communicating and fluctuant. 3. Surgical drain positioned in the lesser omentum. 4. Diffuse thickening of the small bowel and colon, likely reactive in the setting of peritonitis. 5. Cholelithiasis with gallbladder wall thickening, unchanged compared to prior examination and nonspecific in the setting of ascites. 6. Moderate right, small left pleural effusions. Right pleural effusion is enlarged compared to prior examination, although left pleural effusion is decreased. 7. Anasarca. 8. Fluid in the endometrial cavity, unchanged compared to prior examination although abnormal in the postmenopausal setting. 9.  Aortic Atherosclerosis (ICD10-I70.0). Electronically Signed   By: Eddie Candle M.D.   On: 02/25/2019 16:30   CT IMAGE GUIDED DRAINAGE BY PERCUTANEOUS CATHETER  Result Date: 02/26/2019 CLINICAL DATA:  Duodenal ulcer, post surgical repair. Loculated anterior peritoneal fluid collection. EXAM: CT GUIDED DRAINAGE OF PERITONEAL ABSCESS ANESTHESIA/SEDATION: Intravenous Fentanyl 59mcg and Versed 1mg  were administered as conscious sedation during continuous monitoring of the patient's level of consciousness and physiological / cardiorespiratory status by the radiology RN, with a total moderate sedation time of 18 minutes. PROCEDURE: The procedure, risks, benefits, and alternatives were explained to the patient. Questions regarding the procedure were  encouraged and answered. The patient understands and consents to the procedure. Limited axial scans through the abdomen were obtained. The collection was localized and  appropriate skin entry site was determined and marked. The operative field was prepped with chlorhexidinein a sterile fashion, and a sterile drape was applied covering the operative field. A sterile gown and sterile gloves were used for the procedure. Local anesthesia was provided with 1% Lidocaine. Under CT fluoroscopic guidance, a 19 gauge percutaneous entry needle advanced into the anterior peritoneal collection. Brownish viscous opaque fluid could be aspirated. Amplatz guidewire advanced with within the collection, position confirmed on CT. Tract dilated to facilitate placement of a 12 French pigtail drain catheter, formed centrally within the collection. A sample of the aspirate was sent for Gram stain and culture. Catheter secured externally with 0 Prolene suture and StatLock and placed to external gravity drainage. The patient tolerated the procedure well. COMPLICATIONS: None immediate FINDINGS: Anterior loculated gas and fluid collection was localized. 12 French pigtail drain catheter placed within the dependent aspect of the collection as above. IMPRESSION: 1. Technically successful CT-guided peritoneal abscess drain catheter placement. Electronically Signed   By: Lucrezia Europe M.D.   On: 02/26/2019 16:18    Labs:  CBC: Recent Labs    02/22/19 0203 02/24/19 0206 02/24/19 1044 02/25/19 0547 02/26/19 0500 02/26/19 1142  WBC 11.6* 13.2*  --  12.4* 11.7*  --   HGB 8.1* 7.0* 7.0* 7.0* 6.8* 8.7*  HCT 26.5* 23.7* 23.2* 22.7* 22.7* 28.1*  PLT 376 534*  --  540* 628*  --     COAGS: Recent Labs    02/02/19 1832 02/02/19 1952  INR 1.3* 1.2  APTT  --  27    BMP: Recent Labs    02/23/19 1040 02/24/19 0206 02/25/19 0547 02/27/19 0548  NA 138 139 139 139  K 4.5 4.6 4.6 4.8  CL 108 108 108 106  CO2 25 23 25 27   GLUCOSE 118* 129* 137* 150*  BUN 43* 42* 44* 42*  CALCIUM 8.6* 8.7* 8.7* 8.9  CREATININE 0.72 0.72 0.67 0.66  GFRNONAA >60 >60 >60 >60  GFRAA >60 >60 >60 >60    LIVER  FUNCTION TESTS: Recent Labs    02/22/19 0203 02/24/19 0206 02/25/19 0547 02/27/19 0548  BILITOT 0.3 0.2* 0.5 0.3  AST 18 26 22 25   ALT 12 21 19 23   ALKPHOS 142* 194* 206* 275*  PROT 6.0* 6.4* 6.4* 6.7  ALBUMIN 1.1* 1.2* 1.2* 1.2*    Assessment and Plan:  History of perforated duodenal ulcer s/p exploratory laparotomy with oversew of duodenal ulcer in OR 02/03/2019 by Dr. Lucia Gaskins; with subsequent development of peritoneal abscess s/p LLQ anterior peritoneal drain placement in IR 02/26/2019 by Dr. Vernard Gambles. LLQ anterior peritoneal drain stable with approximately 10-20 cc clear brown fluid with debris in gravity bag. Continue current drain management- continue with Qshift flushes/monitor of output. Plan for repeat CT/possible drain injection when output <10 cc/day (assess for possible removal). Further plans per [facility]- appreciate and agree with management. IR to follow.   Electronically Signed: Earley Abide, PA-C 02/27/2019, 2:08 PM   I spent a total of 25 Minutes at the the patient's bedside AND on the patient's hospital floor or unit, greater than 50% of which was counseling/coordinating care for peritoneal abscess s/p LLQ anterior peritoneal drain placement.

## 2019-02-27 NOTE — Progress Notes (Signed)
Nutrition Follow-up    DOCUMENTATION CODES:   Not applicable  INTERVENTION:  - continue TPN per Pharmacist. - will monitor for future ability to provide enteral or oral nutrition.    NUTRITION DIAGNOSIS:   Inadequate oral intake related to altered GI function(pyloric channel ulcer perforation s/p ex lap) as evidenced by NPO status. 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 Patient accompanied by daughter who reports patient has been complaining of "flu-like symptoms" for the past few days endorsing fever and chills. Patient lives alone, but has a caretaker that lives nearby and found patient on the floor of her home very altered. In ED patient found to be hypothermic and severely hypotensive, BG >1000, CT a/p showed findings concerning for mesenteric ischemia.  Significant Events: 11/22- admission 11/23- ex lap; over-sewing of duodenal ulcer with graham patch; pyloric channel ulcer perf; NGT placed 11/29- extubation; NGT replaced 11/30- plan for initiation of TPN 12/1- TPN held 12/3- double lumen PICC placed in R brachial; TPN re-started 12/5- NGT removed  12/7- NGT replaced   Weight down 6 lb from 12/13-12/14 and has been stable since that time. She remains NPO with NGT to suction with 150 ml output this shift. Patient states that her stomach feels alright this morning.   She continues to receive custom TPN at goal rate of 90 ml/hr. This regimen provides 2047 kcal, 129 grams protein to meet 100% estimated needs.  Surgery team notes continue to state patient is not  appropriate for enteral/oral nutrition for the foreseeable future d/t ongoing leak.     Labs reviewed; CBG: 136 mg/dl, BUN: 42 mg/dl, Alk Phos elevated. Medications reviewed; sliding scale novolog.     Diet Order:   Diet Order            Diet NPO time specified Except for: Ice Chips  Diet effective now              EDUCATION NEEDS:   No education needs have been identified at this time  Skin:  Skin Assessment: Skin Integrity Issues: Skin Integrity Issues:: Stage II, Unstageable, Incisions Stage II: L buttocks Unstageable: full thickness coccyx Incisions: abdomen (11/23)  Last BM:  12/10  Height:   Ht Readings from Last 1 Encounters:  02/07/19 _0  (1.575 m)    Weight:   Wt Readings from Last 1 Encounters:  02/27/19 53.2 kg    Ideal Body Weight:  44.2 kg(Adjusted IBW for bilateral BKA)  BMI:  Body mass index is 21.45 kg/m.  Estimated Nutritional Needs:   Kcal:  2025-2330 kcal  Protein:  115-130 grams  Fluid:  >/= 2 L/day     Jarome Matin, MS, RD, LDN, [facility] Inpatient Clinical Dietitian Pager # [phone] After hours/weekend pager # [phone]

## 2019-02-27 NOTE — Progress Notes (Signed)
Patient ID: Gina [name], female   DOB: 1960-10-31, 58 y.o.   MRN: [id number]    25 Days Post-Op  Subjective: Complains of pain in her throat, likely from her NGT.  No new complaints otherwise.  Got IR drain placed yesterday into anterior abdominal collection  ROS: See above, otherwise other systems negative  Objective: Vital signs in last 24 hours: Temp:  [97.6 F (36.4 C)-99.6 F (37.6 C)] 97.9 F (36.6 C) (12/17 0400) Pulse Rate:  [74-93] 81 (12/17 0400) Resp:  [11-21] 11 (12/17 0400) BP: (125-176)/(59-77) 141/59 (12/17 0400) SpO2:  [98 %-100 %] 98 % (12/17 0400) Weight:  [53.2 kg] 53.2 kg (12/17 0500) Last BM Date: (flexi seal in place)  Intake/Output from previous day: 12/16 0701 - 12/17 0700 In: 1508.1 [I.V.:1045.1; Blood:324; IV Piggyback:139] Out: [id number] [Urine:400; Emesis/NG output:25; Drains:1350] Intake/Output this shift: No intake/output data recorded.  PE: Abd: soft, NT, ND, new IR drain with no output currently, surgical drain still with bilious/enteric output to suction, NGT still with low amount of bilious output.  Midline wound is clean and packed, fascia is intact.  Lab Results:  Recent Labs    02/25/19 0547 02/26/19 0500 02/26/19 1142  WBC 12.4* 11.7*  --   HGB 7.0* 6.8* 8.7*  HCT 22.7* 22.7* 28.1*  PLT 540* 628*  --    BMET Recent Labs    02/25/19 0547 02/27/19 0548  NA 139 139  K 4.6 4.8  CL 108 106  CO2 25 27  GLUCOSE 137* 150*  BUN 44* 42*  CREATININE 0.67 0.66  CALCIUM 8.7* 8.9   PT/INR No results for input(s): LABPROT, INR in the last 72 hours. CMP     Component Value Date/Time   NA 139 02/27/2019 0548   NA 139 10/04/2017 1705   K 4.8 02/27/2019 0548   CL 106 02/27/2019 0548   CO2 27 02/27/2019 0548   GLUCOSE 150 (H) 02/27/2019 0548   BUN 42 (H) 02/27/2019 0548   BUN 11 10/04/2017 1705   CREATININE 0.66 02/27/2019 0548   CREATININE 0.68 04/28/2016 1640   CALCIUM 8.9 02/27/2019 0548   PROT 6.7 02/27/2019 0548   PROT 7.1  10/04/2017 1705   ALBUMIN 1.2 (L) 02/27/2019 0548   ALBUMIN 3.7 10/04/2017 1705   AST 25 02/27/2019 0548   ALT 23 02/27/2019 0548   ALKPHOS 275 (H) 02/27/2019 0548   BILITOT 0.3 02/27/2019 0548   BILITOT <0.2 10/04/2017 1705   GFRNONAA >60 02/27/2019 0548   GFRNONAA >89 04/28/2016 1640   GFRAA >60 02/27/2019 0548   GFRAA >89 04/28/2016 1640   Lipase     Component Value Date/Time   LIPASE 27 04/01/2016 1040       Studies/Results: CT ABDOMEN PELVIS W CONTRAST  Result Date: 02/25/2019 CLINICAL DATA:  Abdominal pain, status post gastric perforation repair, evaluate for abscess and the EXAM: CT ABDOMEN AND PELVIS WITH CONTRAST TECHNIQUE: Multidetector CT imaging of the abdomen and pelvis was performed using the standard protocol following bolus administration of intravenous contrast. CONTRAST:  175mL OMNIPAQUE IOHEXOL 300 MG/ML SOLN, additional enteric contrast COMPARISON:  02/18/2019 FINDINGS: Lower chest: Moderate right, small left pleural effusions. Right pleural effusion is enlarged compared to prior examination, left pleural effusion is decreased. Hepatobiliary: No solid liver abnormality is seen. Gallstone in the contracted gallbladder. Gallbladder wall thickening, unchanged compared to prior examination and nonspecific in the setting of ascites. Pancreas: Unremarkable. No pancreatic ductal dilatation or surrounding inflammatory changes. Spleen: Normal in size without significant abnormality. Adrenals/Urinary  Tract: Adrenal glands are unremarkable. Kidneys are normal, without renal calculi, solid lesion, or hydronephrosis. Bladder is unremarkable. Stomach/Bowel: Diffuse thickening of the small bowel and colon (series 3, image 54). Rectal tubes. Vascular/Lymphatic: Aortic atherosclerosis. Mildly enlarged retroperitoneal lymph nodes. Reproductive: Fluid in the endometrial cavity, unchanged compared to prior examination although abnormal in the postmenopausal setting (series 3, image 64).  Other: No abdominal wall hernia. Postoperative findings of midline laparotomy. Surgical drain is positioned in the lesser omentum. Anasarca. There is a persistent fluid collection about the ventral abdomen, which is decreased in size compared to prior examination and partially contrast opacified, suspect persistent leak. This collection measures 14.0 x 3.2 x 14.5 cm, previously 19.6 x 4.2 x 15.2 cm when measured similarly (series 3, image 41, 55, series 7, image 87). Unchanged, loculated fluid collection about the lateral aspect of the right lobe of the liver measuring 7.9 x 4.8 x 1.4 cm (series 3, image 21, series 6, image 45). Interval decrease in size of a loculated appearing fluid collection between the uterus and rectum, measuring 6.1 x 3.1 cm, previously 6.7 x 4.4 cm (series 3, image 66). Interval increase in size of fluid adjacent to the greater curvature of the stomach and spleen in the left upper quadrant, measuring 8.1 x 6.2 cm, previously 6.3 x 3.4 cm (series 3, image 22). Musculoskeletal: Sacral decubitus wound (series 3, image 76). IMPRESSION: 1. There is a persistent fluid collection about the ventral abdomen, which is decreased in size compared to prior examination and partially contrast opacified, suspect persistent leak. This collection measures 14.0 x 3.2 x 14.5 cm, previously 19.6 x 4.2 x 15.2 cm when measured similarly (series 3, image 41, 55, series 7, image 87). 2. Additional fluid collections about the abdomen and pelvis as detailed above, some decreased in size compared to prior examination and some increased, possibly communicating and fluctuant. 3. Surgical drain positioned in the lesser omentum. 4. Diffuse thickening of the small bowel and colon, likely reactive in the setting of peritonitis. 5. Cholelithiasis with gallbladder wall thickening, unchanged compared to prior examination and nonspecific in the setting of ascites. 6. Moderate right, small left pleural effusions. Right pleural  effusion is enlarged compared to prior examination, although left pleural effusion is decreased. 7. Anasarca. 8. Fluid in the endometrial cavity, unchanged compared to prior examination although abnormal in the postmenopausal setting. 9.  Aortic Atherosclerosis (ICD10-I70.0). Electronically Signed   By: Eddie Candle M.D.   On: 02/25/2019 16:30   CT IMAGE GUIDED DRAINAGE BY PERCUTANEOUS CATHETER  Result Date: 02/26/2019 CLINICAL DATA:  Duodenal ulcer, post surgical repair. Loculated anterior peritoneal fluid collection. EXAM: CT GUIDED DRAINAGE OF PERITONEAL ABSCESS ANESTHESIA/SEDATION: Intravenous Fentanyl 34mcg and Versed 1mg  were administered as conscious sedation during continuous monitoring of the patient's level of consciousness and physiological / cardiorespiratory status by the radiology RN, with a total moderate sedation time of 18 minutes. PROCEDURE: The procedure, risks, benefits, and alternatives were explained to the patient. Questions regarding the procedure were encouraged and answered. The patient understands and consents to the procedure. Limited axial scans through the abdomen were obtained. The collection was localized and appropriate skin entry site was determined and marked. The operative field was prepped with chlorhexidinein a sterile fashion, and a sterile drape was applied covering the operative field. A sterile gown and sterile gloves were used for the procedure. Local anesthesia was provided with 1% Lidocaine. Under CT fluoroscopic guidance, a 19 gauge percutaneous entry needle advanced into the anterior peritoneal collection. Brownish  viscous opaque fluid could be aspirated. Amplatz guidewire advanced with within the collection, position confirmed on CT. Tract dilated to facilitate placement of a 12 French pigtail drain catheter, formed centrally within the collection. A sample of the aspirate was sent for Gram stain and culture. Catheter secured externally with 0 Prolene suture and  StatLock and placed to external gravity drainage. The patient tolerated the procedure well. COMPLICATIONS: None immediate FINDINGS: Anterior loculated gas and fluid collection was localized. 12 French pigtail drain catheter placed within the dependent aspect of the collection as above. IMPRESSION: 1. Technically successful CT-guided peritoneal abscess drain catheter placement. Electronically Signed   By: Lucrezia Europe M.D.   On: 02/26/2019 16:18    Anti-infectives: Anti-infectives (From admission, onward)   Start     Dose/Rate Route Frequency Ordered Stop   02/18/19 1800  piperacillin-tazobactam (ZOSYN) IVPB 3.375 g     3.375 g 12.5 mL/hr over 240 Minutes Intravenous Every 8 hours 02/18/19 1652     02/05/19 1000  anidulafungin (ERAXIS) 100 mg in sodium chloride 0.9 % 100 mL IVPB  Status:  Discontinued     100 mg 78 mL/hr over 100 Minutes Intravenous Every 24 hours 02/04/19 0840 02/25/19 1604   02/04/19 1000  anidulafungin (ERAXIS) 200 mg in sodium chloride 0.9 % 200 mL IVPB     200 mg 78 mL/hr over 200 Minutes Intravenous  Once 02/04/19 0840 02/04/19 1301   02/04/19 0200  fluconazole (DIFLUCAN) IVPB 200 mg  Status:  Discontinued     200 mg 100 mL/hr over 60 Minutes Intravenous Every 24 hours 02/02/19 2302 02/04/19 0840   02/03/19 1800  vancomycin (VANCOCIN) IVPB 750 mg/150 ml premix  Status:  Discontinued     750 mg 150 mL/hr over 60 Minutes Intravenous Every 24 hours 02/03/19 0241 02/03/19 0242   02/03/19 1800  vancomycin (VANCOCIN) IVPB 750 mg/150 ml premix  Status:  Discontinued     750 mg 150 mL/hr over 60 Minutes Intravenous Every 24 hours 02/03/19 0242 02/03/19 0858   02/03/19 0600  piperacillin-tazobactam (ZOSYN) IVPB 3.375 g     3.375 g 12.5 mL/hr over 240 Minutes Intravenous Every 8 hours 02/03/19 0243 02/17/19 0205   02/02/19 2315  fluconazole (DIFLUCAN) IVPB 400 mg     400 mg 100 mL/hr over 120 Minutes Intravenous STAT 02/02/19 2301 02/03/19 0218   02/02/19 1800  ceFEPIme  (MAXIPIME) 2 g in sodium chloride 0.9 % 100 mL IVPB     2 g 200 mL/hr over 30 Minutes Intravenous  Once 02/02/19 1745 02/02/19 2030   02/02/19 1800  metroNIDAZOLE (FLAGYL) IVPB 500 mg     500 mg 100 mL/hr over 60 Minutes Intravenous  Once 02/02/19 1745 02/02/19 2237   02/02/19 1800  vancomycin (VANCOCIN) IVPB 1000 mg/200 mL premix  Status:  Discontinued     1,000 mg 200 mL/hr over 60 Minutes Intravenous  Once 02/02/19 1745 02/02/19 1747   02/02/19 1800  vancomycin (VANCOCIN) 1,250 mg in sodium chloride 0.9 % 250 mL IVPB     1,250 mg 166.7 mL/hr over 90 Minutes Intravenous  Once 02/02/19 1747 02/02/19 2237       Assessment/Plan VDRF- extubated 11/29 Poorly controlled DM2- A1c15.2 S/p bilateral BKA Ischemic changes to both LE- defer to medicineandDr. 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    POD25,S/pex lap with oversew of duodenal ulcer,with Graham patch 11/23,Dr. Lucia Gaskins, for perforated prepyloric ulcer with gross contamination -persistent leak noted on CT scan with several fluid collections in the ventral abdomen and in pelvis.  cont IR drain. - BID wet to dry dressing changes to midline abdominal wound - BID PPI -pulm toilet and IS -OOB to chair as much as possible -mobilize, PT/OT -cont TNAgiven leak, will be unable to take in enteral nutrition for the foreseeable future -will cont JP Drain to suction today to help with drainage of all the bile from her leak. -cont conservative management as surgical intervention would be more likely to cause more complications than help at this point due to time from initial operation, SPCM, multiple  comorbidities, etc. -agree with LTAC placement when able -ID recommends 2 more weeks of IV zosyn, then 2 weeks or oral augmentin, unclear that she will be able to to take oral meds at that time still given leak  ID - Eraxis 11/24>>Zosyn 11/23 >>12/6, 12/8 --> FEN -NPO/NGT VTE - NoSCDs,Bilateral BKa's - sq heparin  Foley -out Follow up -Dr. Lucia Gaskins   LOS: 25 days    Henreitta Cea , [facility] 02/27/2019, 8:17 AM Please see Amion for pager number during day hours 7:00am-4:30pm

## 2019-02-27 NOTE — Progress Notes (Signed)
Occupational Therapy Treatment Patient Details Name: Lashan Nellums MRN: [id number] DOB: 1961-01-25 Today's Date: 02/27/2019    History of present illness 58 yo female admitted with septic shock, perforated ulcer. S/P ex lap, oversewing of duodenal ulcer 11/23. Extubated 11/29.  IR drain placed 12/16. Hx of bil BKA (2019/2020), DM, schizophrenia.   OT comments  Pt seen for OT tx this date to f/u re: performance of self care ADLs and improving fxl activity tolerance. OT engages pt in bed level UB ADLs with MOD/MAX A required for grooming tasks. In addition, OT engages pt in b/l hand squeezes to improve/maintain fxl grasp as it pertains to ADL performance. Pt tolerates tx well. OT assists CNA reposition pt to reduce pain through propulsion towards HOB, to which pt does verbalize reduced pain. Pt left resting in bed peacefully with TV on and call bell within reach. OT updated d/c plan to [facility] to reflect higher acuity needs at next venue of care.    Follow Up Recommendations  LTACH    Equipment Recommendations  None recommended by OT;Other (comment)(defer to next venue of care)    Recommendations for Other Services      Precautions / Restrictions Precautions Precautions: Fall Precaution Comments: bil BKA, NG tube, multiple lines/leads, NPO, R jp drain to suction. New IR drain to R lower abd placed 12/16 Restrictions Weight Bearing Restrictions: No       Mobility Bed Mobility                  Transfers                      Balance                                           ADL either performed or assessed with clinical judgement   ADL Overall ADL's : Needs assistance/impaired Eating/Feeding: Moderate assistance Eating/Feeding Details (indicate cue type and reason): Pt requires elbow support and assist with hand to mouth to spoon one piece of crushed ice to mouth. Grooming: Wash/dry face;Oral care;Moderate assistance;Maximal assistance Grooming  Details (indicate cue type and reason): OT engages pt in particiaption in washing face with wash cloth, use of yaunker to clear secretions, and application of lip balm for oral care with. Pt requiring MOD A to wash face with MIN verbal cues to avoid forehead oxygen sensor and NG tube. MAX A required for more FM task of applying lip balm                                     Vision Baseline Vision/History: Wears glasses Wears Glasses: Reading only Patient Visual Report: No change from baseline     Perception     Praxis      Cognition Arousal/Alertness: Awake/alert Behavior During Therapy: WFL for tasks assessed/performed Overall Cognitive Status: No family/caregiver present to determine baseline cognitive functioning Area of Impairment: Problem solving;Memory;Following commands                     Memory: Decreased short-term memory Following Commands: Follows one step commands with increased time     Problem Solving: Slow processing;Requires verbal cues;Requires tactile cues General Comments: Pt with some lability-cried intermittently throughout session, some perseveration over her pink stuffed animal, which OT finds on  shelf in room and gives pt to comfort.        Exercises Other Exercises Other Exercises: OT engages pt in b/l hand squeezes using gauze roll for resistance, for 2 sets x10 reps. Increased assistance to place in L hand d/t severely decreased digit extension and thumb opposition/reposition.   Shoulder Instructions       General Comments      Pertinent Vitals/ Pain       Pain Assessment: Faces Faces Pain Scale: Hurts even more Pain Location: buttocks Pain Descriptors / Indicators: Sore Pain Intervention(s): Monitored during session;Repositioned  Home Living                                          Prior Functioning/Environment              Frequency  Min 2X/week        Progress Toward Goals  OT  Goals(current goals can now be found in the care plan section)  Progress towards OT goals: Progressing toward goals  Acute Rehab OT Goals Patient Stated Goal: To hurt less Time For Goal Achievement: 03/12/19  Plan Discharge plan needs to be updated    Co-evaluation                 AM-PAC OT "6 Clicks" Daily Activity     Outcome Measure   Help from another person eating meals?: A Lot Help from another person taking care of personal grooming?: A Lot Help from another person toileting, which includes using toliet, bedpan, or urinal?: Total Help from another person bathing (including washing, rinsing, drying)?: Total Help from another person to put on and taking off regular upper body clothing?: Total Help from another person to put on and taking off regular lower body clothing?: Total 6 Click Score: 8    End of Session    OT Visit Diagnosis: Muscle weakness (generalized) (M62.81)   Activity Tolerance     Patient Left in bed;with call bell/phone within reach   Nurse Communication Other (comment)(notified RN that pt repositioned-pulled up in bed by OT and CNA to reduce buttocks pain. Pt reporting some pain resolution.)        Time: [id number] OT Time Calculation (min): 38 min  Charges: OT General Charges $OT Visit: 1 Visit OT Treatments $Self Care/Home Management : 23-37 mins $Therapeutic Exercise: 8-22 mins   Sharren Bridge 02/27/2019, 10:38 AM

## 2019-02-27 NOTE — Progress Notes (Signed)
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  She was found to have perforated gastric ulcer with pneumoperitoneum, also had evidence DKA.  She was intubated and admitted to ICU.  Started on broad-spectrum antibiotics, pressors, IV fluids.  She underwent expiratory laparotomy 11/23 with finding of perforated prepyloric ulcer which was repaired, contaminated peritoneal fluid was drained.  She was extubated on 11/29.  She had extensive complicated hospital course with DKA, AKI, acute blood loss anemia, thrombocytopenia, intra-abdominal infection, bacteremia, fungemia.  ID was also following.  General surgery closely following.  NG tube removed on 11/25.  Currently on IV antibiotics.  Abdominal CT done on 12/15 showed persistent abdominal collection.  IR placed left lower quadrant anterior peritoneal drain catheter.  Assessment & Plan:   Principal Problem:   Septic shock ([place]) Active Problems:   DKA (diabetic ketoacidoses) ([place])   Severe protein-calorie malnutrition ([place])   Acute kidney injury ([place])   Pressure injury of skin   Perforated gastric ulcer ([place])   Acute respiratory failure ([place])   Duodenal perforation ([facility])   Below-knee amputee ([place])   Atherosclerosis of native arteries of extremities with gangrene, right leg (HCC)   Atherosclerosis of native arteries of extremities with gangrene, left leg (H[facility])   Septic shock due to ruptured gastric ulcer with acute peritonitis, fungemia, UTI: S/pex lap with oversew of duodenal ulcer,with Graham patch 11/23,DrLucia Gaskins, for perforated prepyloric ulcer with gross contamination.General surgery  following, ID was consulted and following.  Currently on Zosyn.  Blood cultures had shown Candida glabrata and she completed the course for fungemia.  Urine culture showed Klebsiella pneumoniae.  Peritoneal fluid culture showed Candida albicans. No evidence of fungal endophthalmitis after ophthalmology consultation. ID recommending to continue Zosyn for additional 2 weeks starting from 12/15 and switched to Augmentin for additional 2 weeks after that. CT abdomen done on 12/15 showed persistent large abscess, persistent fluid collection around the ventral abdomen.IR placed left lower quadrant anterior peritoneal drain catheter. Aerobic/anaerobic culture now again showing some yeast.  Might need to start on Eraxis. She has a RLQ drain and midline surgical wound.  Ischemic changes at bilateral BKA site: Possibly secondary to severe septic shock.  She was seen by vascular surgery, orthopedics.  She will eventually need above-knee amputation when she is more stable.  Patient declined surgery at this time so continue current medical treatment  Acute kidney injury: Back to baseline now  ICU delirium: Secondary to infectious etiology, hospital admission, ICU environment.  Mental status slowly improving.    DKA with severely uncontrolled hyperglycemia: Resolved.  Hemoglobin A1c of 15.2 as per 11/23.  Continue current insulin regimen  Severe protein calorie malnutrition with hypoalbuminemia: Started on TPN.   Dietitian following  Anemia due to acute blood loss, critical illness: Being transfused today with a unit of PRBC after her hemoglobin dropped to the range of 6.  Total of 3 units so far this hospitalization.  Continue to monitor CBC  Acute thrombocytopenia: Secondary to septic shock.  Resolved  Status post bilateral BKA, left phalanx amputation: Hand surgery and orthopedics are following.  Debility/deconditioning/poor prognosis: Extremely sick patient.  Completed hospital course.  Palliative   care was consulted for goals  of care discussion.  Had meeting with family on 12/9.  Family wanted more time to consider further goals of care discussion.  They are interested on placing her to LTAC.  Case manager following.  Pressure Injury 12/26/18 Buttocks Left Stage II -  Partial thickness loss of dermis presenting as a shallow open ulcer with a red, pink wound bed without slough. (Active)  12/26/18 0630  Location: Buttocks  Location Orientation: Left  Staging: Stage II -  Partial thickness loss of dermis presenting as a shallow open ulcer with a red, pink wound bed without slough.  Wound Description (Comments):   Present on Admission: Yes     Pressure Injury 12/26/18 Buttocks Right;Lower Stage II -  Partial thickness loss of dermis presenting as a shallow open ulcer with a red, pink wound bed without slough. (Active)  12/26/18 0630  Location: Buttocks  Location Orientation: Right;Lower  Staging: Stage II -  Partial thickness loss of dermis presenting as a shallow open ulcer with a red, pink wound bed without slough.  Wound Description (Comments):   Present on Admission: Yes     Pressure Injury 02/03/19 Coccyx Right;Left;Medial Unstageable - Full thickness tissue loss in which the base of the ulcer is covered by slough (yellow, tan, gray, green or brown) and/or eschar (tan, brown or black) in the wound bed. pt has 3 small injuri (Active)  02/03/19 0230  Location: Coccyx  Location Orientation: Right;Left;Medial  Staging: Unstageable - Full thickness tissue loss in which the base of the ulcer is covered by slough (yellow, tan, gray, green or brown) and/or eschar (tan, brown or black) in the wound bed.  Wound Description (Comments): pt has 3 small injuries all in the same area, one is medial and two are on either side of it  Present on Admission: Yes           Nutrition Problem: Inadequate oral intake Etiology: altered GI function(pyloric channel ulcer perforation s/p ex lap)        DVT prophylaxis:Heparin [place] Code Status: Full Family Communication: None present at the bedside Disposition Plan: LTACH when appropriate  Consultants: Neurosurgery, PCCM, ID  Procedures: Exploratory laparotomy  Antimicrobials:  Anti-infectives (From admission, onward)   Start     Dose/Rate Route Frequency Ordered Stop   02/18/19 1800  piperacillin-tazobactam (ZOSYN) IVPB 3.375 g     3.375 g 12.5 mL/hr over 240 Minutes Intravenous Every 8 hours 02/18/19 1652     02/05/19 1000  anidulafungin (ERAXIS) 100 mg in sodium chloride 0.9 % 100 mL IVPB  Status:  Discontinued     100 mg 78 mL/hr over 100 Minutes Intravenous Every 24 hours 02/04/19 0840 02/25/19 1604   02/04/19 1000  anidulafungin (ERAXIS) 200 mg in sodium chloride 0.9 % 200 mL IVPB     200 mg 78 mL/hr over 200 Minutes Intravenous  Once 02/04/19 0840 02/04/19 1301   02/04/19 0200  fluconazole (DIFLUCAN) IVPB 200 mg  Status:  Discontinued     200 mg 100 mL/hr over 60 Minutes Intravenous Every 24 hours 02/02/19 2302 02/04/19 0840   02/03/19 1800  vancomycin (VANCOCIN) IVPB 750 mg/150 ml premix  Status:  Discontinued     750 mg 150 mL/hr over 60 Minutes Intravenous Every 24 hours 02/03/19 0241 02/03/19 0242   02/03/19 1800  vancomycin (VANCOCIN) IVPB 750 mg/150 ml premix  Status:  Discontinued     750 mg 150 mL/hr over 60 Minutes Intravenous Every 24 hours 02/03/19 0242 02/03/19 0858   02/03/19 0600  piperacillin-tazobactam (ZOSYN) IVPB 3.375 g     3.375 g 12.5 mL/hr over 240 Minutes Intravenous Every 8 hours 02/03/19 0243 02/17/19 0205   02/02/19 2315  fluconazole (DIFLUCAN) IVPB 400 mg     400 mg 100 mL/hr over 120 Minutes Intravenous STAT 02/02/19 2301 02/03/19 0218   02/02/19 1800  ceFEPIme (MAXIPIME) 2 g in sodium chloride 0.9 % 100 mL IVPB     2 g 200 mL/hr over 30 Minutes Intravenous  Once 02/02/19 1745 02/02/19 2030   02/02/19 1800  metroNIDAZOLE (FLAGYL) IVPB 500 mg     500 mg 100 mL/hr over 60 Minutes  Intravenous  Once 02/02/19 1745 02/02/19 2237   02/02/19 1800  vancomycin (VANCOCIN) IVPB 1000 mg/200 mL premix  Status:  Discontinued     1,000 mg 200 mL/hr over 60 Minutes Intravenous  Once 02/02/19 1745 02/02/19 1747   02/02/19 1800  vancomycin (VANCOCIN) 1,250 mg in sodium chloride 0.9 % 250 mL IVPB     1,250 mg 166.7 mL/hr over 90 Minutes Intravenous  Once 02/02/19 1747 02/02/19 2237      Subjective: Patient seen and examined the bedside this morning.  Hemodynamically stable.  Alert and oriented at present.  Extremely deconditioned, debilitated.  She was eager to know when she can go home.  Complains of abdominal discomfort  Objective: Vitals:   02/27/19 0000 02/27/19 0400 02/27/19 0500 02/27/19 0800  BP: 133/62 (!) 141/59  (!) 130/56  Pulse:  81  82  Resp: _0 Temp: 97.7 F (36.5 C) 97.9 F (36.6 C)  97.9 F (36.6 C)  TempSrc: Oral Oral  Oral  SpO2:  98%  96%  Weight:   53.2 kg   Height:        Intake/Output Summary (Last 24 hours) at 02/27/2019 1203 Last data filed at 02/27/2019 1100 Gross per 24 hour  Intake 2166.02 ml  Output 2075 ml  Net 91.02 ml   Filed Weights   02/25/19 0500 02/26/19 0412 02/27/19 0500  Weight: 54.7 kg 54.7 kg 53.2 kg    Examination:  General exam: Deconditioned, debilitated, weak HEENT:PERRL, Ear/Nose normal on gross exam Respiratory system: Bilateral equal air entry, normal vesicular breath sounds, no wheezes or crackles  Cardiovascular system: S1 & S2 heard, RRR. No JVD, murmurs, rubs, gallops or clicks.  Gastrointestinal system: Abdomen is mildly distended, soft and has diffuse abdominal tenderness.  Midline abdominal surgical wound, right lower quadrant and left lower quadrant drains  Central nervous system: Alert and awake  extremities: Bilateral BKA.  Amputation site covered with dressing, amputation of left middle finger Skin: No rashes, lesions no icterus ,no pallor.  Unstageable sacral wound   Data Reviewed: I have  personally reviewed following labs and imaging studies  CBC: Recent Labs  Lab 02/21/19 1110 02/22/19 0203 02/24/19 0206 02/24/19 1044 02/25/19 0547 02/26/19 0500 02/26/19 1142  WBC 12.3* 11.6* 13.2*  --  12.4* 11.7*  --   NEUTROABS  --   --  9.4*  --   --   --   --   HGB 7.1* 8.1* 7.0* 7.0* 7.0* 6.8* 8.7*  HCT 22.7* 26.5* 23.7* 23.2* 22.7* 22.7* 28.1*  MCV 93.8 94.3 95.6  --  95.4 96.2  --   PLT 397 376 534*  --  540* 628*  --    Basic Metabolic Panel: Recent Labs  Lab 02/22/19 0203 02/23/19 1040 02/24/19 0206 02/25/19 0547 02/27/19 0548  NA 137 138 139 139 139  K 4.3 4.5  4.6 4.6 4.8  CL 108 108 108 108 106  CO2 20* _0 GLUCOSE 162* 118* 129* 137* 150*  BUN 36* 43* 42* 44* 42*  CREATININE 0.84 0.72 0.72 0.67 0.66  CALCIUM 8.7* 8.6* 8.7* 8.7* 8.9  MG 2.0 2.3 2.2  --  2.0  PHOS 3.2 3.4 3.7  --  3.5   GFR: Estimated Creatinine Clearance: 61.4 mL/min (by C-G formula based on SCr of 0.66 mg/dL). Liver Function Tests: Recent Labs  Lab 02/21/19 1110 02/22/19 0203 02/24/19 0206 02/25/19 0547 02/27/19 0548  AST _1 ALT _2 ALKPHOS 104 142* 194* 206* 275*  BILITOT 0.5 0.3 0.2* 0.5 0.3  PROT 6.0* 6.0* 6.4* 6.4* 6.7  ALBUMIN 1.1* 1.1* 1.2* 1.2* 1.2*   No results for input(s): LIPASE, AMYLASE in the last 168 hours. No results for input(s): AMMONIA in the last 168 hours. Coagulation Profile: No results for input(s): INR, PROTIME in the last 168 hours. Cardiac Enzymes: No results for input(s): CKTOTAL, CKMB, CKMBINDEX, TROPONINI in the last 168 hours. BNP (last 3 results) No results for input(s): PROBNP in the last 8760 hours. HbA1C: No results for input(s): HGBA1C in the last 72 hours. CBG: Recent Labs  Lab 02/26/19 0510 02/26/19 1126 02/26/19 1829 02/26/19 2320 02/27/19 0555  GLUCAP 104* 149* 164* 161* 136*   Lipid Profile: No results for input(s): CHOL, HDL, LDLCALC, TRIG, CHOLHDL, LDLDIRECT in the last 72  hours. Thyroid Function Tests: No results for input(s): TSH, T4TOTAL, FREET4, T3FREE, THYROIDAB in the last 72 hours. Anemia Panel: No results for input(s): VITAMINB12, FOLATE, FERRITIN, TIBC, IRON, RETICCTPCT in the last 72 hours. Sepsis Labs: No results for input(s): PROCALCITON, LATICACIDVEN in the last 168 hours.  Recent Results (from the past 240 hour(s))  Culture, blood (routine x 2)     Status: None   Collection Time: 02/18/19  5:54 PM   Specimen: BLOOD LEFT ARM  Result Value Ref Range Status   Specimen Description   Final    BLOOD LEFT ARM Performed at [facility] [address], [place], [place] [address]    Special Requests   Final    BOTTLES DRAWN AEROBIC AND ANAEROBIC Blood Culture adequate volume Performed at [name] [address], [place], [place] [address]    Culture   Final    NO GROWTH 5 DAYS Performed at [facility], [place] [address], [place], [place] [address]    Report Status 02/24/2019 FINAL  Final  Culture, blood (routine x 2)     Status: None   Collection Time: 02/18/19  6:07 PM   Specimen: BLOOD LEFT ARM  Result Value Ref Range Status   Specimen Description   Final    BLOOD LEFT ARM Performed at [facility] [address], [place], [place] [address]    Special Requests   Final    BOTTLES DRAWN AEROBIC AND ANAEROBIC Blood Culture adequate volume Performed at [facility] [address] [place], [place] [address]    Culture   Final    NO GROWTH 5 DAYS Performed at [facility], [place] [address], [place], [place] [address]    Report Status 02/24/2019 FINAL  Final  Aerobic/Anaerobic Culture (surgical/deep wound)     Status: None (Preliminary result)   Collection Time: 02/26/19  3:31 PM   Specimen: Abscess  Result Value Ref Range Status   Specimen  Description   Final    ABSCESS LLQ CT DRAIN CATHETER Performed at [facility],  [place] [address] [place], [place] [address]    Special Requests   Final    Normal Performed at [facility], [place] [address] [place], [place] [address]    Gram Stain   Final    MODERATE WBC PRESENT,BOTH PMN AND MONONUCLEAR FEW YEAST    Culture   Final    CULTURE REINCUBATED FOR BETTER GROWTH Performed at [facility], [place] [address] [place], [place] [address]    Report Status PENDING  Incomplete         Radiology Studies: CT ABDOMEN PELVIS W CONTRAST  Result Date: 02/25/2019 CLINICAL DATA:  Abdominal pain, status post gastric perforation repair, evaluate for abscess and the EXAM: CT ABDOMEN AND PELVIS WITH CONTRAST TECHNIQUE: Multidetector CT imaging of the abdomen and pelvis was performed using the standard protocol following bolus administration of intravenous contrast. CONTRAST:  112m OMNIPAQUE IOHEXOL 300 MG/ML SOLN, additional enteric contrast COMPARISON:  02/18/2019 FINDINGS: Lower chest: Moderate right, small left pleural effusions. Right pleural effusion is enlarged compared to prior examination, left pleural effusion is decreased. Hepatobiliary: No solid liver abnormality is seen. Gallstone in the contracted gallbladder. Gallbladder wall thickening, unchanged compared to prior examination and nonspecific in the setting of ascites. Pancreas: Unremarkable. No pancreatic ductal dilatation or surrounding inflammatory changes. Spleen: Normal in size without significant abnormality. Adrenals/Urinary Tract: Adrenal glands are unremarkable. Kidneys are normal, without renal calculi, solid lesion, or hydronephrosis. Bladder is unremarkable. Stomach/Bowel: Diffuse thickening of the small bowel and colon (series 3, image 54). Rectal tubes. Vascular/Lymphatic: Aortic atherosclerosis. Mildly enlarged retroperitoneal lymph nodes. Reproductive: Fluid in the endometrial cavity, unchanged compared to prior examination although abnormal in the postmenopausal setting (series  3, image 64). Other: No abdominal wall hernia. Postoperative findings of midline laparotomy. Surgical drain is positioned in the lesser omentum. Anasarca. There is a persistent fluid collection about the ventral abdomen, which is decreased in size compared to prior examination and partially contrast opacified, suspect persistent leak. This collection measures 14.0 x 3.2 x 14.5 cm, previously 19.6 x 4.2 x 15.2 cm when measured similarly (series 3, image 41, 55, series 7, image 87). Unchanged, loculated fluid collection about the lateral aspect of the right lobe of the liver measuring 7.9 x 4.8 x 1.4 cm (series 3, image 21, series 6, image 45). Interval decrease in size of a loculated appearing fluid collection between the uterus and rectum, measuring 6.1 x 3.1 cm, previously 6.7 x 4.4 cm (series 3, image 66). Interval increase in size of fluid adjacent to the greater curvature of the stomach and spleen in the left upper quadrant, measuring 8.1 x 6.2 cm, previously 6.3 x 3.4 cm (series 3, image 22). Musculoskeletal: Sacral decubitus wound (series 3, image 76). IMPRESSION: 1. There is a persistent fluid collection about the ventral abdomen, which is decreased in size compared to prior examination and partially contrast opacified, suspect persistent leak. This collection measures 14.0 x 3.2 x 14.5 cm, previously 19.6 x 4.2 x 15.2 cm when measured similarly (series 3, image 41, 55, series 7, image 87). 2. Additional fluid collections about the abdomen and pelvis as detailed above, some decreased in size compared to prior examination and some increased, possibly communicating and fluctuant. 3. Surgical drain positioned in the lesser omentum. 4. Diffuse thickening of the small bowel and colon, likely reactive in the setting of peritonitis. 5. Cholelithiasis with gallbladder  wall thickening, unchanged compared to prior examination and nonspecific in the setting of ascites. 6. Moderate right, small left pleural effusions.  Right pleural effusion is enlarged compared to prior examination, although left pleural effusion is decreased. 7. Anasarca. 8. Fluid in the endometrial cavity, unchanged compared to prior examination although abnormal in the postmenopausal setting. 9.  Aortic Atherosclerosis (ICD10-I70.0). Electronically Signed   By: Eddie Candle M.D.   On: 02/25/2019 16:30   CT IMAGE GUIDED DRAINAGE BY PERCUTANEOUS CATHETER  Result Date: 02/26/2019 CLINICAL DATA:  Duodenal ulcer, post surgical repair. Loculated anterior peritoneal fluid collection. EXAM: CT GUIDED DRAINAGE OF PERITONEAL ABSCESS ANESTHESIA/SEDATION: Intravenous Fentanyl 47mg and Versed 110mwere administered as conscious sedation during continuous monitoring of the patient's level of consciousness and physiological / cardiorespiratory status by the radiology RN, with a total moderate sedation time of 18 minutes. PROCEDURE: The procedure, risks, benefits, and alternatives were explained to the patient. Questions regarding the procedure were encouraged and answered. The patient understands and consents to the procedure. Limited axial scans through the abdomen were obtained. The collection was localized and appropriate skin entry site was determined and marked. The operative field was prepped with chlorhexidinein a sterile fashion, and a sterile drape was applied covering the operative field. A sterile gown and sterile gloves were used for the procedure. Local anesthesia was provided with 1% Lidocaine. Under CT fluoroscopic guidance, a 19 gauge percutaneous entry needle advanced into the anterior peritoneal collection. Brownish viscous opaque fluid could be aspirated. Amplatz guidewire advanced with within the collection, position confirmed on CT. Tract dilated to facilitate placement of a 12 French pigtail drain catheter, formed centrally within the collection. A sample of the aspirate was sent for Gram stain and culture. Catheter secured externally with 0  Prolene suture and StatLock and placed to external gravity drainage. The patient tolerated the procedure well. COMPLICATIONS: None immediate FINDINGS: Anterior loculated gas and fluid collection was localized. 12 French pigtail drain catheter placed within the dependent aspect of the collection as above. IMPRESSION: 1. Technically successful CT-guided peritoneal abscess drain catheter placement. Electronically Signed   By: D Lucrezia Europe.D.   On: 02/26/2019 16:18        Scheduled Meds: . chlorhexidine gluconate (MEDLINE KIT)  15 mL Mouth Rinse BID  . Chlorhexidine Gluconate Cloth  6 each Topical Daily  . collagenase   Topical Daily  . Gerhardt's butt cream   Topical TID  . heparin injection (subcutaneous)  5,000 Units Subcutaneous Q8H  . insulin aspart  0-20 Units Subcutaneous Q6H  . metoprolol tartrate  2.5 mg Intravenous Q6H  . pantoprazole (PROTONIX) IV  40 mg Intravenous Q12H  . sodium chloride flush  10-40 mL Intracatheter Q12H  . sodium chloride flush  5 mL Intracatheter Q8H   Continuous Infusions: . sodium chloride 10 mL/hr at 02/18/19 1833  . sodium chloride 10 mL/hr at 02/25/19 0600  . piperacillin-tazobactam (ZOSYN)  IV 12.5 mL/hr at 02/27/19 1100  . TPN ADULT (ION) 90 mL/hr at 02/27/19 1100  . TPN ADULT (ION)       LOS: 25 days    Time spent: 35 mins.More than 50% of that time was spent in counseling and/or coordination of care.      AmShelly CossMD [facility] Pager [phone] 7PM-7AM, please contact night-coverage [email or web address] Password [email or web address] 02/27/2019, 12:03 PM

## 2019-02-28 DIAGNOSIS — B952 Enterococcus as the cause of diseases classified elsewhere: Secondary | ICD-10-CM

## 2019-02-28 DIAGNOSIS — B9689 Other specified bacterial agents as the cause of diseases classified elsewhere: Secondary | ICD-10-CM

## 2019-02-28 LAB — TYPE AND SCREEN
ABO/RH(D): O POS
Antibody Screen: NEGATIVE
Unit division: 0
Unit division: 0

## 2019-02-28 LAB — CBC WITH DIFFERENTIAL/PLATELET
Abs Immature Granulocytes: 0.46 10*3/uL — ABNORMAL HIGH (ref 0.00–0.07)
Basophils Absolute: 0.1 10*3/uL (ref 0.0–0.1)
Basophils Relative: 1 %
Eosinophils Absolute: 0.3 10*3/uL (ref 0.0–0.5)
Eosinophils Relative: 3 %
HCT: 31 % — ABNORMAL LOW (ref 36.0–46.0)
Hemoglobin: 9.5 g/dL — ABNORMAL LOW (ref 12.0–15.0)
Immature Granulocytes: 5 %
Lymphocytes Relative: 17 %
Lymphs Abs: 1.6 10*3/uL (ref 0.7–4.0)
MCH: 29.1 pg (ref 26.0–34.0)
MCHC: 30.6 g/dL (ref 30.0–36.0)
MCV: 95.1 fL (ref 80.0–100.0)
Monocytes Absolute: 1 10*3/uL (ref 0.1–1.0)
Monocytes Relative: 11 %
Neutro Abs: 5.9 10*3/uL (ref 1.7–7.7)
Neutrophils Relative %: 63 %
Platelets: 651 10*3/uL — ABNORMAL HIGH (ref 150–400)
RBC: 3.26 MIL/uL — ABNORMAL LOW (ref 3.87–5.11)
RDW: 15.6 % — ABNORMAL HIGH (ref 11.5–15.5)
WBC: 9.4 10*3/uL (ref 4.0–10.5)
nRBC: 0 % (ref 0.0–0.2)

## 2019-02-28 LAB — BPAM RBC
Blood Product Expiration Date: 202101142359
Blood Product Expiration Date: 202101152359
ISSUE DATE / TIME: 202012160649
Unit Type and Rh: 5100
Unit Type and Rh: 5100

## 2019-02-28 LAB — GLUCOSE, CAPILLARY
Glucose-Capillary: 209 mg/dL — ABNORMAL HIGH (ref 70–99)
Glucose-Capillary: 133 mg/dL — ABNORMAL HIGH (ref 70–99)
Glucose-Capillary: 117 mg/dL — ABNORMAL HIGH (ref 70–99)
Glucose-Capillary: 153 mg/dL — ABNORMAL HIGH (ref 70–99)

## 2019-02-28 MED ORDER — TRAVASOL 10 % IV SOLN
INTRAVENOUS | Status: AC
  Filled 2019-02-28: qty 1296

## 2019-02-28 NOTE — Progress Notes (Signed)
PHARMACY - TOTAL PARENTERAL NUTRITION CONSULT NOTE   Indication: Small bowel obstruction  Patient Measurements: Height: 5' 2"  (157.5 cm) Weight: 120 lb 2.4 oz (54.5 kg) IBW/kg (Calculated) : 50.1 TPN AdjBW (KG): 57.9 Body mass index is 21.98 kg/m. Usual Weight: 55-60 kg Ideal Body Weight:  44.2 kg(Adjusted IBW for bilateral BKA)  Assessment:  62 yoF with poorly controlled DM s/p BL BKA, admitted 11/23 with AMS, n/v, abdominal pain. CT abdomen showed pneumoperitoneum and mesenteric ischemia. Underwent ex lap on 11/23 with finding of perforated prepyloric ulcer which was oversewn, along with gross contamination of the peritoneum. Hospital course complicated by DKA, AKI, acute thrombocytopenia, and C. glabrata candidemia. Patient remains with high NGT d/t bowel edema as demonstrated on UGI. TPN started on 11/30 but was stopped shortly thereafter to facilitate central line holiday for candidemia. Repeat blood cultures remain negative to date, and TPN now restarting with replacement of central line. Patient at risk for refeeding given prolonged lack of nutrition.  Glucose / Insulin: Hx DM; prev required D10 W infusion to maintain euglycemia  Current resistant Novolog scale q6h (12/5)   CBGs ( goal <150)  range 134-209, 13 units of Novolog  past 24 hrs, 20 units of regular insulin added to TPN  Electrolytes: WNL, corrected Ca 11.14 Renal: AKI on admit, SCr WNL  LFTs / TGs: Alk phos up to 275, Trig 141(22/14) Prealbumin / albumin: both low (11/30), 5.2 (12/4). 5.3 ( 12/7)  7.7 (12/14),  I/O: 1350 ml from JP drain,  400 ml UOP,  25 ml NGO, 0 ml stool MIVF: 1/2 NS at 10 ml/hr GI Imaging: 11/30 UGI: high NG output & bowel edema 12/15 CTpersistent leak noted on CT scan with several fluid collections in the ventral abdomen and in pelvis.  Surgeries / Procedures: 11/22 ex lap: oversew of duodenal ulcer with [name] patch 12/16 IR placed drain into ant abd collection  Central access: PICC replaced  12/3  TPN start date: resumed 12/3 (~ 2200)  Nutritional Goals (per RD recommendation on 12/11): Kcal:  2025-2330 kcal Protein:  115-130 grams Fluid:  >/= 2 L/day Goal TPN rate is 90 mL/hr (provides 129 g of protein and 2047 kcals per day) - GIR 3.3 mg/kg/min Current Nutrition:  NPO Plan: at 1800  Continue custom TPN at 90 mL/hr, the target rate   Electrolytes in TPN:  18mq/L Na, 25 mEq/L K, 3 mEq/L Ca, Magnesium 10 mEq/L , and 10 mmol/L of Phos ; continue Cl:Ac ratio 1:2  Standard MVI TPN on MWF only d/t nProducer, television/film/video Daily trace elements  Continue resistant SSI with q6h CBG checks and adjust as needed   Continue 20 units of Novolog to TPN  [facility] Mon[facility]  MNetta Cedars PharmD, BCPS 02/28/2019 8:09 AM

## 2019-02-28 NOTE — Progress Notes (Signed)
[facility]   Reason for visit: Follow up on perforated gastric ulcer and C glabrata   Interval History:  WBC wnl, remains afebrile.  No acute events.  Day 27 total antibiotics Day 26 piperacillin/tazobactam Day 25 anidulafungin  Physical Exam: Constitutional:  Vitals:   02/28/19 1000 02/28/19 1100  BP:    Pulse: 83 83  Resp: 18 16  Temp:  97.7 F (36.5 C)  SpO2:     patient appears in NAD, sleeping but arousable; chronically ill appearing Eyes: anicteric HENT: +NGT Respiratory: Normal respiratory effort; CTA B Cardiovascular: RRR  Review of Systems: Constitutional: negative for chills, no fever  Lab Results  Component Value Date   WBC 9.4 02/28/2019   HGB 9.5 (L) 02/28/2019   HCT 31.0 (L) 02/28/2019   MCV 95.1 02/28/2019   PLT 651 (H) 02/28/2019    Lab Results  Component Value Date   CREATININE 0.66 02/27/2019   BUN 42 (H) 02/27/2019   NA 139 02/27/2019   K 4.8 02/27/2019   CL 106 02/27/2019   CO2 27 02/27/2019    Lab Results  Component Value Date   ALT 23 02/27/2019   AST 25 02/27/2019   ALKPHOS 275 (H) 02/27/2019     Microbiology: Recent Results (from the past 240 hour(s))  Culture, blood (routine x 2)     Status: None   Collection Time: 02/18/19  5:54 PM   Specimen: BLOOD LEFT ARM  Result Value Ref Range Status   Specimen Description   Final    BLOOD LEFT ARM Performed at [facility], [place] [address], [place], [place] [address]    Special Requests   Final    BOTTLES DRAWN AEROBIC AND ANAEROBIC Blood Culture adequate volume Performed at [place] [address], [place], [place] [address]    Culture   Final    NO GROWTH 5 DAYS Performed at [facility], [place] [address], [place], [place] [address]    Report Status 02/24/2019 FINAL  Final  Culture, blood (routine x 2)     Status: None   Collection Time: 02/18/19  6:07 PM   Specimen: BLOOD LEFT ARM  Result Value Ref  Range Status   Specimen Description   Final    BLOOD LEFT ARM Performed at [facility] [address], [place], [place] [address]    Special Requests   Final    BOTTLES DRAWN AEROBIC AND ANAEROBIC Blood Culture adequate volume Performed at [place] [facility] [address] [place], [place] [address]    Culture   Final    NO GROWTH 5 DAYS Performed at [facility], [place] [address], [place], [place] [address]    Report Status 02/24/2019 FINAL  Final  Aerobic/Anaerobic Culture (surgical/deep wound)     Status: None (Preliminary result)   Collection Time: 02/26/19  3:31 PM   Specimen: Abscess  Result Value Ref Range Status   Specimen Description   Final    ABSCESS LLQ CT DRAIN CATHETER Performed at [facility], [place] [address], [place], [place] [address]    Special Requests   Final    Normal Performed at [facility], [place] [address] [place], [place] [address]    Gram Stain   Final    MODERATE WBC PRESENT,BOTH PMN AND MONONUCLEAR FEW YEAST    Culture   Final    FEW ENTEROCOCCUS FAECALIS FEW STENOTROPHOMONAS  MALTOPHILIA CULTURE REINCUBATED FOR BETTER GROWTH Performed at [facility], [place] [address], [place], [place] [address]    Report Status PENDING  Incomplete    Impression/Plan:  1. Intra abdominal infection - continued abscess and aspiration from catheter placement with Enterococcus faecalis, Stenotrophomonas.  Yeast on gram stain.  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   I am going to change her to Unasyn for broad coverage including the E faecalis and likely the Stenotrophomonas.    2. Fever - has remained afebrile.   Dr. Megan Salon available over the weekend if needed, otherwise I will follow up on Monday.

## 2019-02-28 NOTE — Progress Notes (Signed)
Referring Physician(s): Saverio Danker ([place])  Supervising Physician: Daryll Brod  Patient Status:  [facility] - In-pt  Chief Complaint: Follow up LLQ drain placed 02/26/19 by Dr. Vernard Gambles  Subjective:  Patient sleeping upon entry to room, arouses to tactile cues but quickly falls back asleep. Does not answer questions.   Allergies: Bee venom and Metformin and related  Medications: Prior to Admission medications   Medication Sig Start Date End Date Taking? Authorizing Provider  acetaminophen (TYLENOL) 325 MG tablet Take 2 tablets (650 mg total) by mouth every 6 (six) hours as needed for mild pain (or Fever >/= 101). 05/21/18  Yes Roney Jaffe, MD  escitalopram (LEXAPRO) 20 MG tablet TAKE ONE TABLET BY MOUTH EVERY DAY Patient taking differently: Take 20 mg by mouth daily.  04/09/18  Yes Charlott Rakes, MD  gabapentin (NEURONTIN) 100 MG capsule Take 2 capsules (200 mg total) by mouth 2 (two) times daily. 11/28/18 02/02/19 Yes Spongberg, Audie Pinto, MD  insulin aspart (NOVOLOG) 100 UNIT/ML injection Inject 0-15 Units into the skin 3 (three) times daily with meals. 05/21/18  Yes Roney Jaffe, MD  Insulin Glargine (LANTUS) 100 UNIT/ML Solostar Pen Inject 35 Units into the skin daily. 12/30/18  Yes Dhungel, Nishant, MD  Multiple Vitamin (MULTIVITAMIN WITH MINERALS) TABS tablet Take 1 tablet by mouth daily.   Yes [provider]  amLODipine (NORVASC) 5 MG tablet Take 1 tablet (5 mg total) by mouth daily. 11/29/18 12/29/18  Spongberg, Audie Pinto, MD  dextrose 50 % solution Inject 25 mLs (12.5 g total) into the vein as needed for low blood sugar. Patient not taking: Reported on 02/02/2019 05/21/18   Roney Jaffe, MD  doxycycline (VIBRA-TABS) 100 MG tablet Take 1 tablet (100 mg total) by mouth 2 (two) times daily. Patient not taking: Reported on 02/02/2019 12/30/18   Dhungel, Flonnie Overman, MD  polyethylene glycol (MIRALAX) packet Take 17 g by mouth daily as needed for mild  constipation. Patient not taking: Reported on 02/02/2019 05/21/18   Roney Jaffe, MD  senna-docusate (SENOKOT-S) 8.6-50 MG tablet Take 2 tablets by mouth at bedtime as needed for mild constipation. Patient not taking: Reported on 02/02/2019 05/21/18   Roney Jaffe, MD     Vital Signs: BP (!) 114/58 (BP Location: Left Arm)   Pulse 83   Temp 97.7 F (36.5 C) (Oral)   Resp 16   Ht 5\' 2"  (1.575 m)   Wt 120 lb 2.4 oz (54.5 kg)   LMP 03/29/2015   SpO2 100%   BMI 21.98 kg/m   Physical Exam Vitals and nursing note reviewed.  Constitutional:      General: She is not in acute distress.    Appearance: She is ill-appearing.     Comments: Somnolent  HENT:     Head: Normocephalic.  Cardiovascular:     Rate and Rhythm: Normal rate.  Pulmonary:     Effort: Pulmonary effort is normal.  Abdominal:     Comments: (+) LLQ drain to gravity with ~25 cc thin brown output. Insertion site clean, dry, dressed appropriately. Flushes/aspirates easily without leakage.   Skin:    General: Skin is warm and dry.     Imaging: CT ABDOMEN PELVIS W CONTRAST  Result Date: 02/25/2019 CLINICAL DATA:  Abdominal pain, status post gastric perforation repair, evaluate for abscess and the EXAM: CT ABDOMEN AND PELVIS WITH CONTRAST TECHNIQUE: Multidetector CT imaging of the abdomen and pelvis was performed using the standard protocol following bolus administration of intravenous contrast. CONTRAST:  136mL OMNIPAQUE  IOHEXOL 300 MG/ML SOLN, additional enteric contrast COMPARISON:  02/18/2019 FINDINGS: Lower chest: Moderate right, small left pleural effusions. Right pleural effusion is enlarged compared to prior examination, left pleural effusion is decreased. Hepatobiliary: No solid liver abnormality is seen. Gallstone in the contracted gallbladder. Gallbladder wall thickening, unchanged compared to prior examination and nonspecific in the setting of ascites. Pancreas: Unremarkable. No pancreatic ductal dilatation or  surrounding inflammatory changes. Spleen: Normal in size without significant abnormality. Adrenals/Urinary Tract: Adrenal glands are unremarkable. Kidneys are normal, without renal calculi, solid lesion, or hydronephrosis. Bladder is unremarkable. Stomach/Bowel: Diffuse thickening of the small bowel and colon (series 3, image 54). Rectal tubes. Vascular/Lymphatic: Aortic atherosclerosis. Mildly enlarged retroperitoneal lymph nodes. Reproductive: Fluid in the endometrial cavity, unchanged compared to prior examination although abnormal in the postmenopausal setting (series 3, image 64). Other: No abdominal wall hernia. Postoperative findings of midline laparotomy. Surgical drain is positioned in the lesser omentum. Anasarca. There is a persistent fluid collection about the ventral abdomen, which is decreased in size compared to prior examination and partially contrast opacified, suspect persistent leak. This collection measures 14.0 x 3.2 x 14.5 cm, previously 19.6 x 4.2 x 15.2 cm when measured similarly (series 3, image 41, 55, series 7, image 87). Unchanged, loculated fluid collection about the lateral aspect of the right lobe of the liver measuring 7.9 x 4.8 x 1.4 cm (series 3, image 21, series 6, image 45). Interval decrease in size of a loculated appearing fluid collection between the uterus and rectum, measuring 6.1 x 3.1 cm, previously 6.7 x 4.4 cm (series 3, image 66). Interval increase in size of fluid adjacent to the greater curvature of the stomach and spleen in the left upper quadrant, measuring 8.1 x 6.2 cm, previously 6.3 x 3.4 cm (series 3, image 22). Musculoskeletal: Sacral decubitus wound (series 3, image 76). IMPRESSION: 1. There is a persistent fluid collection about the ventral abdomen, which is decreased in size compared to prior examination and partially contrast opacified, suspect persistent leak. This collection measures 14.0 x 3.2 x 14.5 cm, previously 19.6 x 4.2 x 15.2 cm when measured  similarly (series 3, image 41, 55, series 7, image 87). 2. Additional fluid collections about the abdomen and pelvis as detailed above, some decreased in size compared to prior examination and some increased, possibly communicating and fluctuant. 3. Surgical drain positioned in the lesser omentum. 4. Diffuse thickening of the small bowel and colon, likely reactive in the setting of peritonitis. 5. Cholelithiasis with gallbladder wall thickening, unchanged compared to prior examination and nonspecific in the setting of ascites. 6. Moderate right, small left pleural effusions. Right pleural effusion is enlarged compared to prior examination, although left pleural effusion is decreased. 7. Anasarca. 8. Fluid in the endometrial cavity, unchanged compared to prior examination although abnormal in the postmenopausal setting. 9.  Aortic Atherosclerosis (ICD10-I70.0). Electronically Signed   By: Eddie Candle M.D.   On: 02/25/2019 16:30   CT IMAGE GUIDED DRAINAGE BY PERCUTANEOUS CATHETER  Result Date: 02/26/2019 CLINICAL DATA:  Duodenal ulcer, post surgical repair. Loculated anterior peritoneal fluid collection. EXAM: CT GUIDED DRAINAGE OF PERITONEAL ABSCESS ANESTHESIA/SEDATION: Intravenous Fentanyl 34mcg and Versed 1mg  were administered as conscious sedation during continuous monitoring of the patient's level of consciousness and physiological / cardiorespiratory status by the radiology RN, with a total moderate sedation time of 18 minutes. PROCEDURE: The procedure, risks, benefits, and alternatives were explained to the patient. Questions regarding the procedure were encouraged and answered. The patient understands and consents to  the procedure. Limited axial scans through the abdomen were obtained. The collection was localized and appropriate skin entry site was determined and marked. The operative field was prepped with chlorhexidinein a sterile fashion, and a sterile drape was applied covering the operative field.  A sterile gown and sterile gloves were used for the procedure. Local anesthesia was provided with 1% Lidocaine. Under CT fluoroscopic guidance, a 19 gauge percutaneous entry needle advanced into the anterior peritoneal collection. Brownish viscous opaque fluid could be aspirated. Amplatz guidewire advanced with within the collection, position confirmed on CT. Tract dilated to facilitate placement of a 12 French pigtail drain catheter, formed centrally within the collection. A sample of the aspirate was sent for Gram stain and culture. Catheter secured externally with 0 Prolene suture and StatLock and placed to external gravity drainage. The patient tolerated the procedure well. COMPLICATIONS: None immediate FINDINGS: Anterior loculated gas and fluid collection was localized. 12 French pigtail drain catheter placed within the dependent aspect of the collection as above. IMPRESSION: 1. Technically successful CT-guided peritoneal abscess drain catheter placement. Electronically Signed   By: Lucrezia Europe M.D.   On: 02/26/2019 16:18    Labs:  CBC: Recent Labs    02/24/19 0206 02/25/19 0547 02/26/19 0500 02/26/19 1142 02/28/19 0148  WBC 13.2* 12.4* 11.7*  --  9.4  HGB 7.0* 7.0* 6.8* 8.7* 9.5*  HCT 23.7* 22.7* 22.7* 28.1* 31.0*  PLT 534* 540* 628*  --  651*    COAGS: Recent Labs    02/02/19 1832 02/02/19 1952  INR 1.3* 1.2  APTT  --  27    BMP: Recent Labs    02/23/19 1040 02/24/19 0206 02/25/19 0547 02/27/19 0548  NA 138 139 139 139  K 4.5 4.6 4.6 4.8  CL 108 108 108 106  CO2 25 23 25 27   GLUCOSE 118* 129* 137* 150*  BUN 43* 42* 44* 42*  CALCIUM 8.6* 8.7* 8.7* 8.9  CREATININE 0.72 0.72 0.67 0.66  GFRNONAA >60 >60 >60 >60  GFRAA >60 >60 >60 >60    LIVER FUNCTION TESTS: Recent Labs    02/22/19 0203 02/24/19 0206 02/25/19 0547 02/27/19 0548  BILITOT 0.3 0.2* 0.5 0.3  AST 18 26 22 25   ALT 12 21 19 23   ALKPHOS 142* 194* 206* 275*  PROT 6.0* 6.4* 6.4* 6.7  ALBUMIN 1.1*  1.2* 1.2* 1.2*    Assessment and Plan:  58 y/o F with history of perforated duodenal ulcer s/p ex lap with oversew of duodenal ulcer 11/23 with subsequent development of peritoneal abscess s/p LLQ drain placement 12/16 in IR seen today for drain follow up.  LLQ drain to gravity with thin, brown output in gravity bag - per I/O 500 output in last 24H. Insertion site unremarkable, no leakage noted.  Continue TID flushes with 3-5 cc NS, record output when gravity bag is emptied, call IR if difficulty flushing drain, dressing changes QD or PRN when soiled. Plan for repeat CT/possible drain injection when output <10 cc/24H (not including flush).  Plans per primary team - IR will continue to follow along for drain management.  Please call IR with questions or concerns.   Electronically Signed: Joaquim Nam, PA-C 02/28/2019, 3:24 PM   I spent a total of 15 Minutes at the the patient's bedside AND on the patient's hospital floor or unit, greater than 50% of which was counseling/coordinating care for LLQ drain follow up.

## 2019-02-28 NOTE — Progress Notes (Signed)
PT Cancellation Note  Patient Details Name: Gina [name] MRN: [id number] DOB: [birth year]   Cancelled Treatment:    Reason Eval/Treat Not Completed: Pain limiting ability to participate;Other (comment) - Pt complaining of fatigue and pain, also states she is upset because she just learned she won't be going home after hospital stay. PT to check back another day per pt request.  Marisa Cyphers, [place] Pager [phone]  Office [place] 02/28/2019, 12:12 PM

## 2019-02-28 NOTE — Progress Notes (Addendum)
Patient ID: Gina [name], female   DOB: 01/07/1961, 58 y.o.   MRN: [id number]    26 Days Post-Op  Subjective: Patient more awake today and engaging in her situation.  She tells me she wants to go home.  I informed her that she is unable to go home because she has a leak in her intestines that may take a long time to heal or may not heal without further surgery that may be months away.  She stated she didn't want to live like this and tried to call her daughter who did not answer and went to VM.  I told her it would be a good idea for her and her family to discuss her wishes moving forward as she has a long hard road to any possible recovery.  ROS: See above, otherwise other systems negative  Objective: Vital signs in last 24 hours: Temp:  [98 F (36.7 C)-98.5 F (36.9 C)] 98.5 F (36.9 C) (12/18 0809) Pulse Rate:  [73-94] 79 (12/18 0600) Resp:  [9-19] 17 (12/18 0500) BP: (114-152)/(58-69) 114/58 (12/18 0400) SpO2:  [96 %-100 %] 100 % (12/17 2000) Weight:  [54.5 kg] 54.5 kg (12/18 0500) Last BM Date: 02/23/19  Intake/Output from previous day: 12/17 0701 - 12/18 0700 In: 2680.5 [I.V.:2351.9; IV Piggyback:328.6] Out: [id number] [Urine:1450; Emesis/NG output:250; Drains:750] Intake/Output this shift: No intake/output data recorded.  PE: Abd: soft, NT, ND, midline wound is clean and packed.  IR drain with thin brown serous type drainage.  Surgical drain with still high amounts of bilious output along with NGT with about 400cc of bilious output.  Lab Results:  Recent Labs    02/26/19 0500 02/26/19 1142 02/28/19 0148  WBC 11.7*  --  9.4  HGB 6.8* 8.7* 9.5*  HCT 22.7* 28.1* 31.0*  PLT 628*  --  651*   BMET Recent Labs    02/27/19 0548  NA 139  K 4.8  CL 106  CO2 27  GLUCOSE 150*  BUN 42*  CREATININE 0.66  CALCIUM 8.9   PT/INR No results for input(s): LABPROT, INR in the last 72 hours. CMP     Component Value Date/Time   NA 139 02/27/2019 0548   NA 139 10/04/2017 1705     K 4.8 02/27/2019 0548   CL 106 02/27/2019 0548   CO2 27 02/27/2019 0548   GLUCOSE 150 (H) 02/27/2019 0548   BUN 42 (H) 02/27/2019 0548   BUN 11 10/04/2017 1705   CREATININE 0.66 02/27/2019 0548   CREATININE 0.68 04/28/2016 1640   CALCIUM 8.9 02/27/2019 0548   PROT 6.7 02/27/2019 0548   PROT 7.1 10/04/2017 1705   ALBUMIN 1.2 (L) 02/27/2019 0548   ALBUMIN 3.7 10/04/2017 1705   AST 25 02/27/2019 0548   ALT 23 02/27/2019 0548   ALKPHOS 275 (H) 02/27/2019 0548   BILITOT 0.3 02/27/2019 0548   BILITOT <0.2 10/04/2017 1705   GFRNONAA >60 02/27/2019 0548   GFRNONAA >89 04/28/2016 1640   GFRAA >60 02/27/2019 0548   GFRAA >89 04/28/2016 1640   Lipase     Component Value Date/Time   LIPASE 27 04/01/2016 1040       Studies/Results: CT IMAGE GUIDED DRAINAGE BY PERCUTANEOUS CATHETER  Result Date: 02/26/2019 CLINICAL DATA:  Duodenal ulcer, post surgical repair. Loculated anterior peritoneal fluid collection. EXAM: CT GUIDED DRAINAGE OF PERITONEAL ABSCESS ANESTHESIA/SEDATION: Intravenous Fentanyl 38mcg and Versed 1mg  were administered as conscious sedation during continuous monitoring of the patient's level of consciousness and physiological / cardiorespiratory status by  the radiology RN, with a total moderate sedation time of 18 minutes. PROCEDURE: The procedure, risks, benefits, and alternatives were explained to the patient. Questions regarding the procedure were encouraged and answered. The patient understands and consents to the procedure. Limited axial scans through the abdomen were obtained. The collection was localized and appropriate skin entry site was determined and marked. The operative field was prepped with chlorhexidinein a sterile fashion, and a sterile drape was applied covering the operative field. A sterile gown and sterile gloves were used for the procedure. Local anesthesia was provided with 1% Lidocaine. Under CT fluoroscopic guidance, a 19 gauge percutaneous entry needle  advanced into the anterior peritoneal collection. Brownish viscous opaque fluid could be aspirated. Amplatz guidewire advanced with within the collection, position confirmed on CT. Tract dilated to facilitate placement of a 12 French pigtail drain catheter, formed centrally within the collection. A sample of the aspirate was sent for Gram stain and culture. Catheter secured externally with 0 Prolene suture and StatLock and placed to external gravity drainage. The patient tolerated the procedure well. COMPLICATIONS: None immediate FINDINGS: Anterior loculated gas and fluid collection was localized. 12 French pigtail drain catheter placed within the dependent aspect of the collection as above. IMPRESSION: 1. Technically successful CT-guided peritoneal abscess drain catheter placement. Electronically Signed   By: Lucrezia Europe M.D.   On: 02/26/2019 16:18    Anti-infectives: Anti-infectives (From admission, onward)   Start     Dose/Rate Route Frequency Ordered Stop   02/28/19 1600  anidulafungin (ERAXIS) 100 mg in sodium chloride 0.9 % 100 mL IVPB     100 mg 78 mL/hr over 100 Minutes Intravenous Every 24 hours 02/27/19 1346     02/27/19 1600  anidulafungin (ERAXIS) 200 mg in sodium chloride 0.9 % 200 mL IVPB     200 mg 78 mL/hr over 200 Minutes Intravenous  Once 02/27/19 1346 02/27/19 1840   02/18/19 1800  piperacillin-tazobactam (ZOSYN) IVPB 3.375 g     3.375 g 12.5 mL/hr over 240 Minutes Intravenous Every 8 hours 02/18/19 1652     02/05/19 1000  anidulafungin (ERAXIS) 100 mg in sodium chloride 0.9 % 100 mL IVPB  Status:  Discontinued     100 mg 78 mL/hr over 100 Minutes Intravenous Every 24 hours 02/04/19 0840 02/25/19 1604   02/04/19 1000  anidulafungin (ERAXIS) 200 mg in sodium chloride 0.9 % 200 mL IVPB     200 mg 78 mL/hr over 200 Minutes Intravenous  Once 02/04/19 0840 02/04/19 1301   02/04/19 0200  fluconazole (DIFLUCAN) IVPB 200 mg  Status:  Discontinued     200 mg 100 mL/hr over 60 Minutes  Intravenous Every 24 hours 02/02/19 2302 02/04/19 0840   02/03/19 1800  vancomycin (VANCOCIN) IVPB 750 mg/150 ml premix  Status:  Discontinued     750 mg 150 mL/hr over 60 Minutes Intravenous Every 24 hours 02/03/19 0241 02/03/19 0242   02/03/19 1800  vancomycin (VANCOCIN) IVPB 750 mg/150 ml premix  Status:  Discontinued     750 mg 150 mL/hr over 60 Minutes Intravenous Every 24 hours 02/03/19 0242 02/03/19 0858   02/03/19 0600  piperacillin-tazobactam (ZOSYN) IVPB 3.375 g     3.375 g 12.5 mL/hr over 240 Minutes Intravenous Every 8 hours 02/03/19 0243 02/17/19 0205   02/02/19 2315  fluconazole (DIFLUCAN) IVPB 400 mg     400 mg 100 mL/hr over 120 Minutes Intravenous STAT 02/02/19 2301 02/03/19 0218   02/02/19 1800  ceFEPIme (MAXIPIME) 2 g in  sodium chloride 0.9 % 100 mL IVPB     2 g 200 mL/hr over 30 Minutes Intravenous  Once 02/02/19 1745 02/02/19 2030   02/02/19 1800  metroNIDAZOLE (FLAGYL) IVPB 500 mg     500 mg 100 mL/hr over 60 Minutes Intravenous  Once 02/02/19 1745 02/02/19 2237   02/02/19 1800  vancomycin (VANCOCIN) IVPB 1000 mg/200 mL premix  Status:  Discontinued     1,000 mg 200 mL/hr over 60 Minutes Intravenous  Once 02/02/19 1745 02/02/19 1747   02/02/19 1800  vancomycin (VANCOCIN) 1,250 mg in sodium chloride 0.9 % 250 mL IVPB     1,250 mg 166.7 mL/hr over 90 Minutes Intravenous  Once 02/02/19 1747 02/02/19 2237       Assessment/Plan VDRF- extubated 11/29 Poorly controlled DM2- A1c15.2 S/p bilateral BKA Ischemic changes to both LE- defer to medicineandDr. 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  POD26,S/pex lap with oversew  of duodenal ulcer,with Graham patch 11/23,Dr. Lucia Gaskins, for perforated prepyloric ulcer with gross contamination -persistent leak noted on CT scan with several fluid collections in the ventral abdomen and in pelvis. cont IR drain and eraxis restarted due to cultures with yeast still present - BID wet to dry dressing changes to midline abdominal wound - BID PPI -pulm toilet and IS -OOB to chair as much as possible -mobilize, PT/OT -cont TNAgiven leak, will be unable to take in enteral nutrition for the foreseeable future -will cont JP Drain to suction today to help with drainage of all the bile from her leak. -cont conservative management as surgical intervention would be more likely to cause more complications than help at this point due to time from initial operation, SPCM, multiple comorbidities, etc. -agree with LTAC placement when able -ID recommends 2 more weeks of IV zosyn, then 2 weeks or oral augmentin, unclear that she will be able to to take oral meds at that time still given leak  -Patient will be a PRN for the weekend  ID - Eraxis 11/24>>restarted on 12/17-->Zosyn 11/23 >>12/6, 12/8 --> FEN -NPO/NGT VTE - NoSCDs,Bilateral BKa's - sq heparin  Foley -out Follow up -Dr. Lucia Gaskins   LOS: 26 days    Henreitta Cea , [facility] 02/28/2019, 8:25 AM Please see Amion for pager number during day hours 7:00am-4:30pm

## 2019-02-28 NOTE — Progress Notes (Signed)
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  She was found to have perforated gastric ulcer with pneumoperitoneum, also had evidence DKA.  She was intubated and admitted to ICU.  Started on broad-spectrum antibiotics, pressors, IV fluids.  She underwent expiratory laparotomy 11/23 with finding of perforated prepyloric ulcer which was repaired, contaminated peritoneal fluid was drained.  She was extubated on 11/29.  She had extensive complicated hospital course with DKA, AKI, acute blood loss anemia, thrombocytopenia, intra-abdominal infection, bacteremia, fungemia.  ID was also following.  General surgery closely following.  NG tube removed on 11/25.  Currently on IV antibiotics.  Abdominal CT done on 12/15 showed persistent abdominal collection.  IR placed left lower quadrant anterior peritoneal drain catheter on 02/26/19.Restarted on eraxis after aerobic/anaerobic culture showed yeast.ID also following.  Assessment & Plan:   Principal Problem:   Septic shock (Gina [name]) Active Problems:   DKA (diabetic ketoacidoses) (Gina [name])   Severe protein-calorie malnutrition (Gina [name])   Acute kidney injury (Gina [name])   Pressure injury of skin   Perforated gastric ulcer (Gina [name])   Acute respiratory failure (Gina [name])   Duodenal perforation (Gina [name])   Below-knee amputee (Gina [name])   Atherosclerosis of native arteries of extremities with gangrene, right leg (Gina [name])   Atherosclerosis of native arteries of extremities with gangrene, left leg (Gina [name])   Septic shock due to ruptured gastric ulcer with acute peritonitis, fungemia, UTI: S/pex lap with oversew of duodenal ulcer,with Graham patch  11/23,DrLucia [name], for perforated prepyloric ulcer with gross contamination.General surgery following, ID was consulted and following.  Currently on Zosyn.  Blood cultures had shown Candida glabrata and she completed the course for fungemia.  Urine culture showed Klebsiella pneumoniae.  Peritoneal fluid culture showed Candida albicans. No evidence of fungal endophthalmitis after ophthalmology consultation. ID recommending to continue Zosyn for additional 2 weeks starting from 12/15 and switched to Augmentin for additional 2 weeks after that. CT abdomen done on 12/15 showed persistent large abscess, persistent fluid collection around the ventral abdomen.IR placed left lower quadrant anterior peritoneal drain catheter on 12/26.Gina [name] Aerobic/anaerobic culture now again showing some yeast.  Restarted on Eraxis. She has a RLQ JP drain and midline surgical wound.Has NG tube  Ischemic changes at bilateral BKA site: Possibly secondary to severe septic shock.  She was seen by vascular surgery, orthopedics.  She will eventually need above-knee amputation when she is more stable.  Patient declined surgery at this time so continue current medical treatment  Acute kidney injury: Back to baseline now  ICU delirium: Secondary to infectious etiology, hospital admission, ICU environment.  Mental status has improved and she is currently alert and oriented.  DKA with severely uncontrolled hyperglycemia: Resolved.  Hemoglobin A1c of 15.2 as per 11/23.  Continue current insulin regimen  Severe protein calorie malnutrition with hypoalbuminemia: Started on TPN.   Dietitian following  Anemia due to acute blood loss, critical illness:  Total of 3 units so far this hospitalization.  Continue to monitor CBC  Acute thrombocytopenia: Secondary to septic shock.  Resolved  Status post bilateral BKA, left phalanx amputation: Hand surgery and orthopedics were following.  Debility/deconditioning/poor prognosis: Extremely sick  patient.  Completed hospital course.  Palliative  care was consulted for  goals of care discussion.  Had meeting with family on 12/9.  Family wanted more time to consider further goals of care discussion.  They are interested on placing her to LTAC.  Case manager following.We will continue to discuss with family.  Pressure Injury 12/26/18 Buttocks Left Stage II -  Partial thickness loss of dermis presenting as a shallow open ulcer with a red, pink wound bed without slough. (Active)  12/26/18 0630  Location: Buttocks  Location Orientation: Left  Staging: Stage II -  Partial thickness loss of dermis presenting as a shallow open ulcer with a red, pink wound bed without slough.  Wound Description (Comments):   Present on Admission: Yes     Pressure Injury 12/26/18 Buttocks Right;Lower Stage II -  Partial thickness loss of dermis presenting as a shallow open ulcer with a red, pink wound bed without slough. (Active)  12/26/18 0630  Location: Buttocks  Location Orientation: Right;Lower  Staging: Stage II -  Partial thickness loss of dermis presenting as a shallow open ulcer with a red, pink wound bed without slough.  Wound Description (Comments):   Present on Admission: Yes     Pressure Injury 02/03/19 Coccyx Right;Left;Medial Unstageable - Full thickness tissue loss in which the base of the ulcer is covered by slough (yellow, tan, gray, green or brown) and/or eschar (tan, brown or black) in the wound bed. pt has 3 small injuri (Active)  02/03/19 0230  Location: Coccyx  Location Orientation: Right;Left;Medial  Staging: Unstageable - Full thickness tissue loss in which the base of the ulcer is covered by slough (yellow, tan, gray, green or brown) and/or eschar (tan, brown or black) in the wound bed.  Wound Description (Comments): pt has 3 small injuries all in the same area, one is medial and two are on either side of it  Present on Admission: Yes           Nutrition Problem: Inadequate oral  intake Etiology: altered GI function(pyloric channel ulcer perforation s/p ex lap)      DVT prophylaxis:Heparin Gina [name] Code Status: Full Family Communication: None present at the bedside Disposition Plan: LTACH when appropriate  Consultants: Neurosurgery, PCCM, ID  Procedures: Exploratory laparotomy  Antimicrobials:  Anti-infectives (From admission, onward)   Start     Dose/Rate Route Frequency Ordered Stop   02/28/19 1600  anidulafungin (ERAXIS) 100 mg in sodium chloride 0.9 % 100 mL IVPB     100 mg 78 mL/hr over 100 Minutes Intravenous Every 24 hours 02/27/19 1346     02/27/19 1600  anidulafungin (ERAXIS) 200 mg in sodium chloride 0.9 % 200 mL IVPB     200 mg 78 mL/hr over 200 Minutes Intravenous  Once 02/27/19 1346 02/27/19 1840   02/18/19 1800  piperacillin-tazobactam (ZOSYN) IVPB 3.375 g     3.375 g 12.5 mL/hr over 240 Minutes Intravenous Every 8 hours 02/18/19 1652     02/05/19 1000  anidulafungin (ERAXIS) 100 mg in sodium chloride 0.9 % 100 mL IVPB  Status:  Discontinued     100 mg 78 mL/hr over 100 Minutes Intravenous Every 24 hours 02/04/19 0840 02/25/19 1604   02/04/19 1000  anidulafungin (ERAXIS) 200 mg in sodium chloride 0.9 % 200 mL IVPB     200 mg 78 mL/hr over 200 Minutes Intravenous  Once 02/04/19 0840 02/04/19 1301   02/04/19 0200  fluconazole (DIFLUCAN) IVPB 200 mg  Status:  Discontinued     200 mg 100 mL/hr over 60 Minutes Intravenous Every 24 hours 02/02/19  2302 02/04/19 0840   02/03/19 1800  vancomycin (VANCOCIN) IVPB 750 mg/150 ml premix  Status:  Discontinued     750 mg 150 mL/hr over 60 Minutes Intravenous Every 24 hours 02/03/19 0241 02/03/19 0242   02/03/19 1800  vancomycin (VANCOCIN) IVPB 750 mg/150 ml premix  Status:  Discontinued     750 mg 150 mL/hr over 60 Minutes Intravenous Every 24 hours 02/03/19 0242 02/03/19 0858   02/03/19 0600  piperacillin-tazobactam (ZOSYN) IVPB 3.375 g     3.375 g 12.5 mL/hr over 240 Minutes Intravenous Every 8 hours  02/03/19 0243 02/17/19 0205   02/02/19 2315  fluconazole (DIFLUCAN) IVPB 400 mg     400 mg 100 mL/hr over 120 Minutes Intravenous STAT 02/02/19 2301 02/03/19 0218   02/02/19 1800  ceFEPIme (MAXIPIME) 2 g in sodium chloride 0.9 % 100 mL IVPB     2 g 200 mL/hr over 30 Minutes Intravenous  Once 02/02/19 1745 02/02/19 2030   02/02/19 1800  metroNIDAZOLE (FLAGYL) IVPB 500 mg     500 mg 100 mL/hr over 60 Minutes Intravenous  Once 02/02/19 1745 02/02/19 2237   02/02/19 1800  vancomycin (VANCOCIN) IVPB 1000 mg/200 mL premix  Status:  Discontinued     1,000 mg 200 mL/hr over 60 Minutes Intravenous  Once 02/02/19 1745 02/02/19 1747   02/02/19 1800  vancomycin (VANCOCIN) 1,250 mg in sodium chloride 0.9 % 250 mL IVPB     1,250 mg 166.7 mL/hr over 90 Minutes Intravenous  Once 02/02/19 1747 02/02/19 2237      Subjective: Patient seen and examined the bedside this morning.  Hemodynamically stable.  She looks more alert and comfortable today.  She feels hungry and says he wants the NG tube out, wants to have some ice chips or food.  Objective: Vitals:   02/28/19 0400 02/28/19 0500 02/28/19 0600 02/28/19 0809  BP: (!) 114/58     Pulse: 73 78 79   Resp: 13 17    Temp: 98 F (36.7 C)   98.5 F (36.9 C)  TempSrc: Oral   Oral  SpO2:      Weight:  54.5 kg    Height:        Intake/Output Summary (Last 24 hours) at 02/28/2019 1113 Last data filed at 02/28/2019 6301 Gross per 24 hour  Intake 1109 ml  Output 2415 ml  Net -1306 ml   Filed Weights   02/26/19 0412 02/27/19 0500 02/28/19 0500  Weight: 54.7 kg 53.2 kg 54.5 kg    Examination:  General exam: Extremely Deconditioned, debilitated, weak HEENT:PERRL, Ear/Nose normal on gross exam Respiratory system: Bilateral equal air entry, normal vesicular breath sounds, no wheezes or crackles  Cardiovascular system: S1 & S2 heard, RRR. No JVD, murmurs, rubs, gallops or clicks.  Gastrointestinal system: Abdomen is mildly distended, soft  ,nontender.  Midline abdominal surgical wound, right lower quadrant and left lower quadrant drains  Central nervous system: Alert and awake  extremities: Bilateral BKA.  Amputation site covered with dressing, amputation of left middle finger Skin: No rashes, lesions no icterus ,no pallor.  Unstageable sacral wound   Data Reviewed: I have personally reviewed following labs and imaging studies  CBC: Recent Labs  Lab 02/22/19 0203 02/24/19 0206 02/24/19 1044 02/25/19 0547 02/26/19 0500 02/26/19 1142 02/28/19 0148  WBC 11.6* 13.2*  --  12.4* 11.7*  --  9.4  NEUTROABS  --  9.4*  --   --   --   --  5.9  HGB 8.1*  7.0* 7.0* 7.0* 6.8* 8.7* 9.5*  HCT 26.5* 23.7* 23.2* 22.7* 22.7* 28.1* 31.0*  MCV 94.3 95.6  --  95.4 96.2  --  95.1  PLT 376 534*  --  540* 628*  --  867*   Basic Metabolic Panel: Recent Labs  Lab 02/22/19 0203 02/23/19 1040 02/24/19 0206 02/25/19 0547 02/27/19 0548  NA 137 138 139 139 139  K 4.3 4.5 4.6 4.6 4.8  CL 108 108 108 108 106  CO2 20* 25 23 25 27   GLUCOSE 162* 118* 129* 137* 150*  BUN 36* 43* 42* 44* 42*  CREATININE 0.84 0.72 0.72 0.67 0.66  CALCIUM 8.7* 8.6* 8.7* 8.7* 8.9  MG 2.0 2.3 2.2  --  2.0  PHOS 3.2 3.4 3.7  --  3.5   GFR: Estimated Creatinine Clearance: 61.4 mL/min (by C-G formula based on SCr of 0.66 mg/dL). Liver Function Tests: Recent Labs  Lab 02/22/19 0203 02/24/19 0206 02/25/19 0547 02/27/19 0548  AST 18 26 22 25   ALT 12 21 19 23   ALKPHOS 142* 194* 206* 275*  BILITOT 0.3 0.2* 0.5 0.3  PROT 6.0* 6.4* 6.4* 6.7  ALBUMIN 1.1* 1.2* 1.2* 1.2*   No results for input(s): LIPASE, AMYLASE in the last 168 hours. No results for input(s): AMMONIA in the last 168 hours. Coagulation Profile: No results for input(s): INR, PROTIME in the last 168 hours. Cardiac Enzymes: No results for input(s): CKTOTAL, CKMB, CKMBINDEX, TROPONINI in the last 168 hours. BNP (last 3 results) No results for input(s): PROBNP in the last 8760 hours. HbA1C: No  results for input(s): HGBA1C in the last 72 hours. CBG: Recent Labs  Lab 02/27/19 0555 02/27/19 1207 02/27/19 1753 02/27/19 2305 02/28/19 0616  GLUCAP 136* 137* 134* 130* 209*   Lipid Profile: No results for input(s): CHOL, HDL, LDLCALC, TRIG, CHOLHDL, LDLDIRECT in the last 72 hours. Thyroid Function Tests: No results for input(s): TSH, T4TOTAL, FREET4, T3FREE, THYROIDAB in the last 72 hours. Anemia Panel: No results for input(s): VITAMINB12, FOLATE, FERRITIN, TIBC, IRON, RETICCTPCT in the last 72 hours. Sepsis Labs: No results for input(s): PROCALCITON, LATICACIDVEN in the last 168 hours.  Recent Results (from the past 240 hour(s))  Culture, blood (routine x 2)     Status: None   Collection Time: 02/18/19  5:54 PM   Specimen: BLOOD LEFT ARM  Result Value Ref Range Status   Specimen Description   Final    BLOOD LEFT ARM Performed at [facility] [address] [place], [place] [address]    Special Requests   Final    BOTTLES DRAWN AEROBIC AND ANAEROBIC Blood Culture adequate volume Performed at [facility] [address], [place], [place] [address]    Culture   Final    NO GROWTH 5 DAYS Performed at [facility], [place] [address] [place], [place] [address]    Report Status 02/24/2019 FINAL  Final  Culture, blood (routine x 2)     Status: None   Collection Time: 02/18/19  6:07 PM   Specimen: BLOOD LEFT ARM  Result Value Ref Range Status   Specimen Description   Final    BLOOD LEFT ARM Performed at [facility] [address] [place], [place] [address]    Special Requests   Final    BOTTLES DRAWN AEROBIC AND ANAEROBIC Blood Culture adequate volume Performed at [facility] [address], [place], [place] [address]    Culture   Final  NO GROWTH 5 DAYS Performed at [facility], [place] [address] [place], [place] [address]    Report Status 02/24/2019 FINAL  Final    Aerobic/Anaerobic Culture (surgical/deep wound)     Status: None (Preliminary result)   Collection Time: 02/26/19  3:31 PM   Specimen: Abscess  Result Value Ref Range Status   Specimen Description   Final    ABSCESS LLQ CT DRAIN CATHETER Performed at [facility], [place] [address], [place], [place] [address]    Special Requests   Final    Normal Performed at [facility], [place] [address], [place], [place] [address]    Gram Stain   Final    MODERATE WBC PRESENT,BOTH PMN AND MONONUCLEAR FEW YEAST    Culture   Final    CULTURE REINCUBATED FOR BETTER GROWTH Performed at [facility], [place] [address], [place],  [address]    Report Status PENDING  Incomplete         Radiology Studies: CT IMAGE GUIDED DRAINAGE BY PERCUTANEOUS CATHETER  Result Date: 02/26/2019 CLINICAL DATA:  Duodenal ulcer, post surgical repair. Loculated anterior peritoneal fluid collection. EXAM: CT GUIDED DRAINAGE OF PERITONEAL ABSCESS ANESTHESIA/SEDATION: Intravenous Fentanyl 42mg and Versed 165mwere administered as conscious sedation during continuous monitoring of the patient's level of consciousness and physiological / cardiorespiratory status by the radiology RN, with a total moderate sedation time of 18 minutes. PROCEDURE: The procedure, risks, benefits, and alternatives were explained to the patient. Questions regarding the procedure were encouraged and answered. The patient understands and consents to the procedure. Limited axial scans through the abdomen were obtained. The collection was localized and appropriate skin entry site was determined and marked. The operative field was prepped with chlorhexidinein a sterile fashion, and a sterile drape was applied covering the operative field. A sterile gown and sterile gloves were used for the procedure. Local anesthesia was provided with 1% Lidocaine. Under CT fluoroscopic guidance, a 19 gauge percutaneous entry  needle advanced into the anterior peritoneal collection. Brownish viscous opaque fluid could be aspirated. Amplatz guidewire advanced with within the collection, position confirmed on CT. Tract dilated to facilitate placement of a 12 French pigtail drain catheter, formed centrally within the collection. A sample of the aspirate was sent for Gram stain and culture. Catheter secured externally with 0 Prolene suture and StatLock and placed to external gravity drainage. The patient tolerated the procedure well. COMPLICATIONS: None immediate FINDINGS: Anterior loculated gas and fluid collection was localized. 12 French pigtail drain catheter placed within the dependent aspect of the collection as above. IMPRESSION: 1. Technically successful CT-guided peritoneal abscess drain catheter placement. Electronically Signed   By: D Lucrezia Europe.D.   On: 02/26/2019 16:18        Scheduled Meds: . chlorhexidine gluconate (MEDLINE KIT)  15 mL Mouth Rinse BID  . Chlorhexidine Gluconate Cloth  6 each Topical Daily  . collagenase   Topical Daily  . Gerhardt's butt cream   Topical TID  . heparin injection (subcutaneous)  5,000 Units Subcutaneous Q8H  . insulin aspart  0-20 Units Subcutaneous Q6H  . metoprolol tartrate  2.5 mg Intravenous Q6H  . pantoprazole (PROTONIX) IV  40 mg Intravenous Q12H  . sodium chloride flush  10-40 mL Intracatheter Q12H  . sodium chloride flush  5 mL Intracatheter Q8H   Continuous Infusions: . sodium chloride 10 mL/hr at 02/18/19 1833  . sodium chloride 10 mL/hr at 02/25/19 0600  . anidulafungin    .  piperacillin-tazobactam (ZOSYN)  IV 3.375 g (02/28/19 1012)  . TPN ADULT (ION) 90 mL/hr at 02/27/19 1842  . TPN ADULT (ION)       LOS: 26 days    Time spent: 35 mins.More than 50% of that time was spent in counseling and/or coordination of care.      Shelly Coss, MD [facility] Pager [phone]  If 7PM-7AM, please contact night-coverage [email or web address] Password  [email or web address] 02/28/2019, 11:13 AM

## 2019-03-01 LAB — GLUCOSE, CAPILLARY
Glucose-Capillary: 113 mg/dL — ABNORMAL HIGH (ref 70–99)
Glucose-Capillary: 128 mg/dL — ABNORMAL HIGH (ref 70–99)
Glucose-Capillary: 142 mg/dL — ABNORMAL HIGH (ref 70–99)
Glucose-Capillary: 148 mg/dL — ABNORMAL HIGH (ref 70–99)
Glucose-Capillary: 163 mg/dL — ABNORMAL HIGH (ref 70–99)
Glucose-Capillary: 237 mg/dL — ABNORMAL HIGH (ref 70–99)

## 2019-03-01 MED ORDER — TRAVASOL 10 % IV SOLN
INTRAVENOUS | Status: AC
  Filled 2019-03-01: qty 1296

## 2019-03-01 MED ORDER — SODIUM CHLORIDE 0.9 % IV SOLN
3.0000 g | Freq: Three times a day (TID) | INTRAVENOUS | Status: DC
  Administered 2019-03-01 – 2019-03-10 (×27): 3 g via INTRAVENOUS
  Filled 2019-03-01 (×2): qty 8
  Filled 2019-03-01 (×4): qty 3
  Filled 2019-03-01 (×2): qty 8
  Filled 2019-03-01 (×2): qty 3
  Filled 2019-03-01 (×2): qty 8
  Filled 2019-03-01: qty 3
  Filled 2019-03-01 (×3): qty 8
  Filled 2019-03-01 (×2): qty 3
  Filled 2019-03-01: qty 8
  Filled 2019-03-01 (×2): qty 3
  Filled 2019-03-01: qty 8
  Filled 2019-03-01: qty 3
  Filled 2019-03-01 (×4): qty 8
  Filled 2019-03-01: qty 3
  Filled 2019-03-01: qty 8

## 2019-03-01 NOTE — Progress Notes (Signed)
PHARMACY - TOTAL PARENTERAL NUTRITION CONSULT NOTE   Indication: Small bowel obstruction  Patient Measurements: Height: 5' 2"  (157.5 cm) Weight: 113 lb 15.7 oz (51.7 kg) IBW/kg (Calculated) : 50.1 TPN AdjBW (KG): 57.9 Body mass index is 20.85 kg/m. Usual Weight: 55-60 kg Ideal Body Weight:  44.2 kg(Adjusted IBW for bilateral BKA)  Assessment:  66 yoF with poorly controlled DM s/p BL BKA, admitted 11/23 with AMS, n/v, abdominal pain. CT abdomen showed pneumoperitoneum and mesenteric ischemia. Underwent ex lap on 11/23 with finding of perforated prepyloric ulcer which was oversewn, along with gross contamination of the peritoneum. Hospital course complicated by DKA, AKI, acute thrombocytopenia, and C. glabrata candidemia. Patient remains with high NGT d/t bowel edema as demonstrated on UGI. TPN started on 11/30 but was stopped shortly thereafter to facilitate central line holiday for candidemia. Repeat blood cultures remain negative to date, and TPN now restarting with replacement of central line. Patient at risk for refeeding given prolonged lack of nutrition.  Glucose / Insulin: Hx DM; prev required D10 W infusion to maintain euglycemia  Current resistant Novolog scale q6h (12/5)   CBGs ( goal <150)  range 133-209, 21 units of Novolog  past 24 hrs, 20 units of regular insulin added to TPN  Electrolytes: WNL, corrected Ca 11.14 Renal: AKI on admit, SCr WNL  LFTs / TGs: Alk phos up to 275, Trig 141(22/14) Prealbumin / albumin: both low (11/30), 5.2 (12/4). 5.3 ( 12/7)  7.7 (12/14),  I/O: 1350 ml from JP drain,  400 ml UOP,  25 ml NGO, 0 ml stool MIVF: 1/2 NS at 10 ml/hr GI Imaging: 11/30 UGI: high NG output & bowel edema 12/15 CTpersistent leak noted on CT scan with several fluid collections in the ventral abdomen and in pelvis.  Surgeries / Procedures: 11/22 ex lap: oversew of duodenal ulcer with [name] patch 12/16 IR placed drain into ant abd collection  Central access: PICC  replaced 12/3  TPN start date: resumed 12/3 (~ 2200)  Nutritional Goals (per RD recommendation on 12/11): Kcal:  2025-2330 kcal Protein:  115-130 grams Fluid:  >/= 2 L/day Goal TPN rate is 90 mL/hr (provides 129 g of protein and 2047 kcals per day) - GIR 3.3 mg/kg/min Current Nutrition:  NPO Plan: at 1800  Continue custom TPN at 90 mL/hr, the target rate   Electrolytes in TPN:  67mq/L Na, 25 mEq/L K, 3 mEq/L Ca, Magnesium 10 mEq/L , and 10 mmol/L of Phos ; continue Cl:Ac ratio 1:2  Standard MVI TPN on MWF only d/t nProducer, television/film/video Daily trace elements  Continue resistant SSI with q6h CBG checks and adjust as needed   Increase Novolog to 25 units in TPN  Continue 1/2 NS at [facility] on Mon/Thurs  MNetta Cedars PharmD, BCPS 03/01/2019 8:08 AM

## 2019-03-01 NOTE — Progress Notes (Signed)
Pharmacy Antibiotic Note  Gina [name] is a 58 y.o. female with perforated gastric ulcer & complicated post-op course.  Pharmacy has been consulted for Unasyn dosing.  03/01/2019:  D28 total abx (D27 Zosyn, D26 Eraxis)  Afebrile, WBC WNL, Scr stable  Cx of abscess + VRE (sensitive ampicillin)   Plan: Change Zosyn to Unasyn 3gm IV q8h No further dose adjustments anticipated- pharmacy to sign off dosing.   Height: 5\' 2"  (157.5 cm) Weight: 113 lb 15.7 oz (51.7 kg) IBW/kg (Calculated) : 50.1  Temp (24hrs), Avg:98.1 F (36.7 C), Min:97.8 F (36.6 C), Max:98.4 F (36.9 C)  Recent Labs  Lab 02/23/19 1040 02/24/19 0206 02/25/19 0547 02/26/19 0500 02/27/19 0548 02/28/19 0148  WBC  --  13.2* 12.4* 11.7*  --  9.4  CREATININE 0.72 0.72 0.67  --  0.66  --     Estimated Creatinine Clearance: 61.4 mL/min (by C-G formula based on SCr of 0.66 mg/dL).    Allergies  Allergen Reactions  . Bee Venom Swelling    Swells up with bee stings   . Metformin And Related Other (See Comments)    Upset stomach    Antimicrobials this admission: 11/22 Vanc/Cefepime/Flagyl >> 11/23   Zosyn 11/23 >> 12/06 >> 12/8 >> 12/19 11/22 Fluconazole >> Eraxis 11/24 >> 12/15  12/17>> 12/19 Unasyn>>   Microbiology results: 11/22 BCx x1: C glabrata S-echinocandins, Diflucan (MIC 16; dose dependent) - MICs for AmphoB, Vori, Itra, Posa, flucytosine, but no breakpoints given 11/22 UCx: Klebsiella (R to ampicillin, I to nitro) 11/22 Cov2: negative 11/23 peritoneal culture: C.albicans, C glabrata 11/23 MRSA PCR: negative 11/25 HCV Ab+ 11/24 BCx: NG-final 11/28 BCx: ng-final 12/8: BCx: NGF 12/16 Abscess LLQ drain catheter: enterococcus faecalis (R=Vanc; S=amp)  Thank you for allowing pharmacy to be a part of this patient's care.  Netta Cedars, PharmD, BCPS 03/01/2019 1:06 PM

## 2019-03-01 NOTE — Progress Notes (Signed)
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  She was found to have perforated gastric ulcer with pneumoperitoneum, also had evidence DKA.  She was intubated and admitted to ICU.  Started on broad-spectrum antibiotics, pressors, IV fluids.  She underwent expiratory laparotomy 11/23 with finding of perforated prepyloric ulcer which was repaired, contaminated peritoneal fluid was drained.  She was extubated on 11/29.  She had extensive complicated hospital course with DKA, AKI, acute blood loss anemia, thrombocytopenia, intra-abdominal infection, bacteremia, fungemia.  ID was also following.  General surgery closely following.  NG tube removed on 11/25.  Currently on IV antibiotics.  Abdominal CT done on 12/15 showed persistent abdominal collection.  IR placed left lower quadrant anterior peritoneal drain catheter on 02/26/19.Restarted on eraxis after aerobic/anaerobic culture showed yeast.ID also following.   Assessment & Plan:   Principal Problem:   Septic shock ([place]) Active Problems:   DKA (diabetic ketoacidoses) ([place])   Severe protein-calorie malnutrition ([place])   Acute kidney injury ([place])   Pressure injury of skin   Perforated gastric ulcer ([place])   Acute respiratory failure ([place])   Duodenal perforation ([facility])   Below-knee amputee ([place])   Atherosclerosis of native arteries of extremities with gangrene, right leg (H[facility])   Atherosclerosis of native arteries of extremities with gangrene, left leg (H[facility])   Septic shock due to ruptured gastric ulcer with acute peritonitis, fungemia, UTI: S/pex lap with oversew of duodenal ulcer,with [place]  patch 11/23,DrLucia Gaskins, for perforated prepyloric ulcer with gross contamination.General surgery following, ID was consulted and following. Blood cultures had shown Candida glabrata and she completed the course for fungemia.  Urine culture showed Klebsiella pneumoniae.  Peritoneal fluid culture showed Candida albicans. No evidence of fungal endophthalmitis after ophthalmology consultation.. CT abdomen done on 12/15 showed persistent large abscess, persistent fluid collection around the ventral abdomen.IR placed left lower quadrant anterior peritoneal drain catheter on 12/26.[name] Aerobic/anaerobic culture now again showing some yeast, Enterococcus faecalis, stenotrophomonas.  Restarted on Eraxis.Currently on unasyn.She has a RLQ JP drain and midline surgical wound.Has NG tube  Ischemic changes at bilateral BKA site: Possibly secondary to severe septic shock.  She was seen by vascular surgery, orthopedics.  She will eventually need above-knee amputation when she is more stable.  Patient declined surgery at this time so continue current medical treatment  Acute kidney injury: Back to baseline now  ICU delirium: Secondary to infectious etiology, hospital admission, ICU environment.  Mental status has improved and she is currently alert and oriented.  DKA with severely uncontrolled hyperglycemia: Resolved.  Hemoglobin A1c of 15.2 as per 11/23.  Continue current insulin regimen  Severe protein calorie malnutrition with hypoalbuminemia: Started on TPN.   Dietitian following  Anemia due to acute blood loss, critical illness:  Total of 3 units so far this hospitalization.  Continue to monitor CBC  Acute thrombocytopenia: Secondary to septic shock.  Resolved  Status post bilateral BKA, left phalanx amputation: Hand surgery and orthopedics were following.  Debility/deconditioning/poor prognosis: Extremely sick patient.  Completed hospital course.  Palliative  care was consulted for goals of care discussion.   Had meeting with family on 12/9.  Family wanted more time to consider further goals of care  discussion.  They are interested on placing her to LTAC.  Case manager following.We will continue to discuss with family.  Pressure Injury 12/26/18 Buttocks Left Stage II -  Partial thickness loss of dermis presenting as a shallow open ulcer with a red, pink wound bed without slough. (Active)  12/26/18 0630  Location: Buttocks  Location Orientation: Left  Staging: Stage II -  Partial thickness loss of dermis presenting as a shallow open ulcer with a red, pink wound bed without slough.  Wound Description (Comments):   Present on Admission: Yes     Pressure Injury 12/26/18 Buttocks Right;Lower Stage II -  Partial thickness loss of dermis presenting as a shallow open ulcer with a red, pink wound bed without slough. (Active)  12/26/18 0630  Location: Buttocks  Location Orientation: Right;Lower  Staging: Stage II -  Partial thickness loss of dermis presenting as a shallow open ulcer with a red, pink wound bed without slough.  Wound Description (Comments):   Present on Admission: Yes     Pressure Injury 02/03/19 Coccyx Right;Left;Medial Unstageable - Full thickness tissue loss in which the base of the ulcer is covered by slough (yellow, tan, gray, green or brown) and/or eschar (tan, brown or black) in the wound bed. pt has 3 small injuri (Active)  02/03/19 0230  Location: Coccyx  Location Orientation: Right;Left;Medial  Staging: Unstageable - Full thickness tissue loss in which the base of the ulcer is covered by slough (yellow, tan, gray, green or brown) and/or eschar (tan, brown or black) in the wound bed.  Wound Description (Comments): pt has 3 small injuries all in the same area, one is medial and two are on either side of it  Present on Admission: Yes           Nutrition Problem: Inadequate oral intake Etiology: altered GI function(pyloric channel ulcer perforation s/p ex lap)      DVT  prophylaxis:Heparin [place] Code Status: Full Family Communication: Discussed with daughter on phone on 02/28/19 Disposition Plan: LTACH when appropriate  Consultants: Neurosurgery, PCCM, ID  Procedures: Exploratory laparotomy  Antimicrobials:  Anti-infectives (From admission, onward)   Start     Dose/Rate Route Frequency Ordered Stop   02/28/19 1600  anidulafungin (ERAXIS) 100 mg in sodium chloride 0.9 % 100 mL IVPB     100 mg 78 mL/hr over 100 Minutes Intravenous Every 24 hours 02/27/19 1346     02/27/19 1600  anidulafungin (ERAXIS) 200 mg in sodium chloride 0.9 % 200 mL IVPB     200 mg 78 mL/hr over 200 Minutes Intravenous  Once 02/27/19 1346 02/27/19 1840   02/18/19 1800  piperacillin-tazobactam (ZOSYN) IVPB 3.375 g     3.375 g 12.5 mL/hr over 240 Minutes Intravenous Every 8 hours 02/18/19 1652     02/05/19 1000  anidulafungin (ERAXIS) 100 mg in sodium chloride 0.9 % 100 mL IVPB  Status:  Discontinued     100 mg 78 mL/hr over 100 Minutes Intravenous Every 24 hours 02/04/19 0840 02/25/19 1604   02/04/19 1000  anidulafungin (ERAXIS) 200 mg in sodium chloride 0.9 % 200 mL IVPB     200 mg 78 mL/hr over 200 Minutes Intravenous  Once 02/04/19 0840 02/04/19 1301   02/04/19 0200  fluconazole (DIFLUCAN) IVPB 200 mg  Status:  Discontinued     200 mg 100 mL/hr over 60 Minutes Intravenous Every 24 hours 02/02/19 2302 02/04/19 0840   02/03/19 1800  vancomycin (VANCOCIN) IVPB 750 mg/150 ml premix  Status:  Discontinued  750 mg 150 mL/hr over 60 Minutes Intravenous Every 24 hours 02/03/19 0241 02/03/19 0242   02/03/19 1800  vancomycin (VANCOCIN) IVPB 750 mg/150 ml premix  Status:  Discontinued     750 mg 150 mL/hr over 60 Minutes Intravenous Every 24 hours 02/03/19 0242 02/03/19 0858   02/03/19 0600  piperacillin-tazobactam (ZOSYN) IVPB 3.375 g     3.375 g 12.5 mL/hr over 240 Minutes Intravenous Every 8 hours 02/03/19 0243 02/17/19 0205   02/02/19 2315  fluconazole (DIFLUCAN) IVPB 400 mg       400 mg 100 mL/hr over 120 Minutes Intravenous STAT 02/02/19 2301 02/03/19 0218   02/02/19 1800  ceFEPIme (MAXIPIME) 2 g in sodium chloride 0.9 % 100 mL IVPB     2 g 200 mL/hr over 30 Minutes Intravenous  Once 02/02/19 1745 02/02/19 2030   02/02/19 1800  metroNIDAZOLE (FLAGYL) IVPB 500 mg     500 mg 100 mL/hr over 60 Minutes Intravenous  Once 02/02/19 1745 02/02/19 2237   02/02/19 1800  vancomycin (VANCOCIN) IVPB 1000 mg/200 mL premix  Status:  Discontinued     1,000 mg 200 mL/hr over 60 Minutes Intravenous  Once 02/02/19 1745 02/02/19 1747   02/02/19 1800  vancomycin (VANCOCIN) 1,250 mg in sodium chloride 0.9 % 250 mL IVPB     1,250 mg 166.7 mL/hr over 90 Minutes Intravenous  Once 02/02/19 1747 02/02/19 2237      Subjective: Patient seen and examined at bedside this morning.  Hemodynamically stable.  No significant change from yesterday.  Wants to eat something and wants NG tube out.  Wants to go home  Objective: Vitals:   03/01/19 0500 03/01/19 0745 03/01/19 0800 03/01/19 1200  BP:  108/65 127/64 (!) 119/58  Pulse:  79 78 81  Resp:  16 15 12   Temp:   97.8 F (36.6 C)   TempSrc:   Oral   SpO2:      Weight: 51.7 kg     Height:        Intake/Output Summary (Last 24 hours) at 03/01/2019 1255 Last data filed at 03/01/2019 1227 Gross per 24 hour  Intake 3552.96 ml  Output 2565 ml  Net 987.96 ml   Filed Weights   02/27/19 0500 02/28/19 0500 03/01/19 0500  Weight: 53.2 kg 54.5 kg 51.7 kg    Examination:  General exam: Extremely Deconditioned, debilitated, weak HEENT:PERRL, Ear/Nose normal on gross exam,NG tube Respiratory system: Bilateral equal air entry, normal vesicular breath sounds, no wheezes or crackles  Cardiovascular system: S1 & S2 heard, RRR. No JVD, murmurs, rubs, gallops or clicks.  Gastrointestinal system: Abdomen is mildly distended, soft ,nontender.  Midline abdominal surgical wound, right lower quadrant and left lower quadrant drains  Central nervous  system: Alert and awake  extremities: Bilateral BKA.  Amputation site covered with dressing, amputation of left middle finger Skin: No rashes, lesions no icterus ,no pallor.  Unstageable sacral wound   Data Reviewed: I have personally reviewed following labs and imaging studies  CBC: Recent Labs  Lab 02/24/19 0206 02/24/19 1044 02/25/19 0547 02/26/19 0500 02/26/19 1142 02/28/19 0148  WBC 13.2*  --  12.4* 11.7*  --  9.4  NEUTROABS 9.4*  --   --   --   --  5.9  HGB 7.0* 7.0* 7.0* 6.8* 8.7* 9.5*  HCT 23.7* 23.2* 22.7* 22.7* 28.1* 31.0*  MCV 95.6  --  95.4 96.2  --  95.1  PLT 534*  --  540* 628*  --  651*  Basic Metabolic Panel: Recent Labs  Lab 02/23/19 1040 02/24/19 0206 02/25/19 0547 02/27/19 0548  NA 138 139 139 139  K 4.5 4.6 4.6 4.8  CL 108 108 108 106  CO2 25 23 25 27   GLUCOSE 118* 129* 137* 150*  BUN 43* 42* 44* 42*  CREATININE 0.72 0.72 0.67 0.66  CALCIUM 8.6* 8.7* 8.7* 8.9  MG 2.3 2.2  --  2.0  PHOS 3.4 3.7  --  3.5   GFR: Estimated Creatinine Clearance: 61.4 mL/min (by C-G formula based on SCr of 0.66 mg/dL). Liver Function Tests: Recent Labs  Lab 02/24/19 0206 02/25/19 0547 02/27/19 0548  AST 26 22 25   ALT 21 19 23   ALKPHOS 194* 206* 275*  BILITOT 0.2* 0.5 0.3  PROT 6.4* 6.4* 6.7  ALBUMIN 1.2* 1.2* 1.2*   No results for input(s): LIPASE, AMYLASE in the last 168 hours. No results for input(s): AMMONIA in the last 168 hours. Coagulation Profile: No results for input(s): INR, PROTIME in the last 168 hours. Cardiac Enzymes: No results for input(s): CKTOTAL, CKMB, CKMBINDEX, TROPONINI in the last 168 hours. BNP (last 3 results) No results for input(s): PROBNP in the last 8760 hours. HbA1C: No results for input(s): HGBA1C in the last 72 hours. CBG: Recent Labs  Lab 02/28/19 1746 02/28/19 1953 03/01/19 0005 03/01/19 0604 03/01/19 1155  GLUCAP 117* 153* 237* 163* 148*   Lipid Profile: No results for input(s): CHOL, HDL, LDLCALC, TRIG,  CHOLHDL, LDLDIRECT in the last 72 hours. Thyroid Function Tests: No results for input(s): TSH, T4TOTAL, FREET4, T3FREE, THYROIDAB in the last 72 hours. Anemia Panel: No results for input(s): VITAMINB12, FOLATE, FERRITIN, TIBC, IRON, RETICCTPCT in the last 72 hours. Sepsis Labs: No results for input(s): PROCALCITON, LATICACIDVEN in the last 168 hours.  Recent Results (from the past 240 hour(s))  Aerobic/Anaerobic Culture (surgical/deep wound)     Status: None (Preliminary result)   Collection Time: 02/26/19  3:31 PM   Specimen: Abscess  Result Value Ref Range Status   Specimen Description ABSCESS LLQ CT DRAIN CATHETER  Final   Special Requests Normal  Final   Gram Stain   Final    MODERATE WBC PRESENT,BOTH PMN AND MONONUCLEAR FEW YEAST    Culture   Final    FEW ENTEROCOCCUS FAECALIS FEW STENOTROPHOMONAS MALTOPHILIA CULTURE REINCUBATED FOR BETTER GROWTH    Report Status PENDING  Incomplete   Organism ID, Bacteria ENTEROCOCCUS FAECALIS  Final      Susceptibility   Enterococcus faecalis - MIC*    AMPICILLIN <=2 SENSITIVE Sensitive     VANCOMYCIN >=32 RESISTANT Resistant     GENTAMICIN SYNERGY SENSITIVE Sensitive     LINEZOLID Value in next row Sensitive      2 SENSITIVEPerformed at [facility], [address] [place], [place] [address]    * FEW ENTEROCOCCUS FAECALIS         Radiology Studies: No results found.      Scheduled Meds: . chlorhexidine gluconate (MEDLINE KIT)  15 mL Mouth Rinse BID  . Chlorhexidine Gluconate Cloth  6 each Topical Daily  . collagenase   Topical Daily  . Gerhardt's butt cream   Topical TID  . heparin injection (subcutaneous)  5,000 Units Subcutaneous Q8H  . insulin aspart  0-20 Units Subcutaneous Q6H  . metoprolol tartrate  2.5 mg Intravenous Q6H  . pantoprazole (PROTONIX) IV  40 mg Intravenous Q12H  . sodium chloride flush  10-40 mL Intracatheter Q12H  . sodium chloride flush  5  mL Intracatheter Q8H   Continuous Infusions: .  sodium chloride 10 mL/hr at 02/18/19 1833  . sodium chloride 10 mL/hr at 02/25/19 0600  . anidulafungin Stopped (02/28/19 1726)  . piperacillin-tazobactam (ZOSYN)  IV 12.5 mL/hr at 03/01/19 1227  . TPN ADULT (ION) 90 mL/hr at 03/01/19 1227  . TPN ADULT (ION)       LOS: 27 days    Time spent: 35 mins.More than 50% of that time was spent in counseling and/or coordination of care.      Shelly Coss, MD [facility] Pager [phone]  If 7PM-7AM, please contact night-coverage [email or web address] Password [email or web address] 03/01/2019, 12:55 PM

## 2019-03-02 LAB — GLUCOSE, CAPILLARY
Glucose-Capillary: 107 mg/dL — ABNORMAL HIGH (ref 70–99)
Glucose-Capillary: 116 mg/dL — ABNORMAL HIGH (ref 70–99)
Glucose-Capillary: 141 mg/dL — ABNORMAL HIGH (ref 70–99)
Glucose-Capillary: 167 mg/dL — ABNORMAL HIGH (ref 70–99)
Glucose-Capillary: 208 mg/dL — ABNORMAL HIGH (ref 70–99)

## 2019-03-02 MED ORDER — TRAVASOL 10 % IV SOLN
INTRAVENOUS | Status: DC
  Filled 2019-03-02: qty 1296

## 2019-03-02 NOTE — Progress Notes (Signed)
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  She was found to have perforated gastric ulcer with pneumoperitoneum, also had evidence DKA.  She was intubated and admitted to ICU.  Started on broad-spectrum antibiotics, pressors, IV fluids.  She underwent expiratory laparotomy 11/23 with finding of perforated prepyloric ulcer which was repaired, contaminated peritoneal fluid was drained.  She was extubated on 11/29.  She had extensive complicated hospital course with DKA, AKI, acute blood loss anemia, thrombocytopenia, intra-abdominal infection, bacteremia, fungemia.  ID was also following.  General surgery closely following.  NG tube removed on 11/25.  Currently on IV antibiotics.  Abdominal CT done on 12/15 showed persistent abdominal collection.  IR placed left lower quadrant anterior peritoneal drain catheter on 02/26/19.Restarted on eraxis after aerobic/anaerobic culture showed yeast.ID also following.   Assessment & Plan:   Principal Problem:   Septic shock ([place]) Active Problems:   DKA (diabetic ketoacidoses) ([place])   Severe protein-calorie malnutrition ([place])   Acute kidney injury ([place])   Pressure injury of skin   Perforated gastric ulcer ([place])   Acute respiratory failure ([place])   Duodenal perforation (H[facility])   Below-knee amputee ([place])   Atherosclerosis of native arteries of extremities with gangrene, right leg (HCC)   Atherosclerosis of native arteries of extremities with gangrene, left leg (HCC)   Septic shock due to ruptured gastric ulcer with acute peritonitis, fungemia, UTI: S/pex lap with oversew of duodenal ulcer,with Graham  patch 11/23,DrLucia Gaskins, for perforated prepyloric ulcer with gross contamination.General surgery following. ID was consulted and following. Blood cultures had shown Candida glabrata and she completed the course for fungemia.  Urine culture showed Klebsiella pneumoniae.  Peritoneal fluid culture showed Candida albicans. No evidence of fungal endophthalmitis after ophthalmology consultation.. CT abdomen done on 12/15 showed persistent large abscess, persistent fluid collection around the ventral abdomen.IR placed left lower quadrant anterior peritoneal drain catheter on 12/26.[name] Aerobic/anaerobic culture now again showing some yeast, Enterococcus faecalis, stenotrophomonas.  Restarted on Eraxis.Currently on unasyn.She has a RLQ JP drain and midline surgical wound.Has NG tube  Ischemic changes at bilateral BKA site: Possibly secondary to severe septic shock.  She was seen by vascular surgery, orthopedics.  She will eventually need above-knee amputation when she is more stable.  Patient declined surgery at this time so continue current medical treatment  Acute kidney injury: Back to baseline now  ICU delirium: Secondary to infectious etiology, hospital admission, ICU environment.  Mental status has improved and she is currently alert and oriented.  DKA with severely uncontrolled hyperglycemia: Resolved.  Hemoglobin A1c of 15.2 as per 11/23.  Continue current insulin regimen  Severe protein calorie malnutrition with hypoalbuminemia: Started on TPN.   Dietitian following  Anemia due to acute blood loss, critical illness:  Total of 3 units so far this hospitalization.  Continue to monitor CBC  Acute thrombocytopenia: Secondary to septic shock.  Resolved  Status post bilateral BKA, left phalanx amputation: Hand surgery and orthopedics were following.  Debility/deconditioning/poor prognosis: Extremely sick patient.  Completed hospital course.  Palliative  care was consulted for goals of care discussion.   Had meeting with family on 12/9.  Family wanted more time to consider further goals of care  discussion.  They are interested on placing her to LTAC.  Case manager/SW following.We will continue to discuss with family.  Pressure Injury 12/26/18 Buttocks Left Stage II -  Partial thickness loss of dermis presenting as a shallow open ulcer with a red, pink wound bed without slough. (Active)  12/26/18 0630  Location: Buttocks  Location Orientation: Left  Staging: Stage II -  Partial thickness loss of dermis presenting as a shallow open ulcer with a red, pink wound bed without slough.  Wound Description (Comments):   Present on Admission: Yes     Pressure Injury 12/26/18 Buttocks Right;Lower Stage II -  Partial thickness loss of dermis presenting as a shallow open ulcer with a red, pink wound bed without slough. (Active)  12/26/18 0630  Location: Buttocks  Location Orientation: Right;Lower  Staging: Stage II -  Partial thickness loss of dermis presenting as a shallow open ulcer with a red, pink wound bed without slough.  Wound Description (Comments):   Present on Admission: Yes     Pressure Injury 02/03/19 Coccyx Right;Left;Medial Unstageable - Full thickness tissue loss in which the base of the ulcer is covered by slough (yellow, tan, gray, green or brown) and/or eschar (tan, brown or black) in the wound bed. pt has 3 small injuri (Active)  02/03/19 0230  Location: Coccyx  Location Orientation: Right;Left;Medial  Staging: Unstageable - Full thickness tissue loss in which the base of the ulcer is covered by slough (yellow, tan, gray, green or brown) and/or eschar (tan, brown or black) in the wound bed.  Wound Description (Comments): pt has 3 small injuries all in the same area, one is medial and two are on either side of it  Present on Admission: Yes           Nutrition Problem: Inadequate oral intake Etiology: altered GI function(pyloric channel ulcer perforation s/p ex lap)       DVT prophylaxis:Heparin [place] Code Status: Full Family Communication: Discussed with daughter on phone on 02/28/19 Disposition Plan: LTACH when appropriate  Consultants: Neurosurgery, PCCM, ID  Procedures: Exploratory laparotomy  Antimicrobials:  Anti-infectives (From admission, onward)   Start     Dose/Rate Route Frequency Ordered Stop   03/01/19 1800  Ampicillin-Sulbactam (UNASYN) 3 g in sodium chloride 0.9 % 100 mL IVPB     3 g 200 mL/hr over 30 Minutes Intravenous Every 8 hours 03/01/19 1304     02/28/19 1600  anidulafungin (ERAXIS) 100 mg in sodium chloride 0.9 % 100 mL IVPB     100 mg 78 mL/hr over 100 Minutes Intravenous Every 24 hours 02/27/19 1346     02/27/19 1600  anidulafungin (ERAXIS) 200 mg in sodium chloride 0.9 % 200 mL IVPB     200 mg 78 mL/hr over 200 Minutes Intravenous  Once 02/27/19 1346 02/27/19 1840   02/18/19 1800  piperacillin-tazobactam (ZOSYN) IVPB 3.375 g  Status:  Discontinued     3.375 g 12.5 mL/hr over 240 Minutes Intravenous Every 8 hours 02/18/19 1652 03/01/19 1259   02/05/19 1000  anidulafungin (ERAXIS) 100 mg in sodium chloride 0.9 % 100 mL IVPB  Status:  Discontinued     100 mg 78 mL/hr over 100 Minutes Intravenous Every 24 hours 02/04/19 0840 02/25/19 1604   02/04/19 1000  anidulafungin (ERAXIS) 200 mg in sodium chloride 0.9 % 200 mL IVPB     200 mg 78 mL/hr over 200 Minutes Intravenous  Once 02/04/19 0840 02/04/19 1301   02/04/19 0200  fluconazole (DIFLUCAN) IVPB 200 mg  Status:  Discontinued     200 mg 100 mL/hr over 60 Minutes Intravenous Every 24 hours 02/02/19 2302 02/04/19 0840   02/03/19 1800  vancomycin (VANCOCIN) IVPB 750 mg/150 ml premix  Status:  Discontinued     750 mg 150 mL/hr over 60 Minutes Intravenous Every 24 hours 02/03/19 0241 02/03/19 0242   02/03/19 1800  vancomycin (VANCOCIN) IVPB 750 mg/150 ml premix  Status:  Discontinued     750 mg 150 mL/hr over 60 Minutes Intravenous Every 24 hours 02/03/19 0242 02/03/19 0858    02/03/19 0600  piperacillin-tazobactam (ZOSYN) IVPB 3.375 g     3.375 g 12.5 mL/hr over 240 Minutes Intravenous Every 8 hours 02/03/19 0243 02/17/19 0205   02/02/19 2315  fluconazole (DIFLUCAN) IVPB 400 mg     400 mg 100 mL/hr over 120 Minutes Intravenous STAT 02/02/19 2301 02/03/19 0218   02/02/19 1800  ceFEPIme (MAXIPIME) 2 g in sodium chloride 0.9 % 100 mL IVPB     2 g 200 mL/hr over 30 Minutes Intravenous  Once 02/02/19 1745 02/02/19 2030   02/02/19 1800  metroNIDAZOLE (FLAGYL) IVPB 500 mg     500 mg 100 mL/hr over 60 Minutes Intravenous  Once 02/02/19 1745 02/02/19 2237   02/02/19 1800  vancomycin (VANCOCIN) IVPB 1000 mg/200 mL premix  Status:  Discontinued     1,000 mg 200 mL/hr over 60 Minutes Intravenous  Once 02/02/19 1745 02/02/19 1747   02/02/19 1800  vancomycin (VANCOCIN) 1,250 mg in sodium chloride 0.9 % 250 mL IVPB     1,250 mg 166.7 mL/hr over 90 Minutes Intravenous  Once 02/02/19 1747 02/02/19 2237      Subjective: Patient seen and examined the bedside this morning.  Hemodynamically stable.  She was very emotional and crying and saying that she wants to go home for Christmas.  Denies any belly pain today.  Objective: Vitals:   03/02/19 0400 03/02/19 0453 03/02/19 0600 03/02/19 0800  BP: (!) 142/68  (!) 175/93 (!) 158/81  Pulse: 87  85   Resp: 11  16 16   Temp:    98.1 F (36.7 C)  TempSrc:    Oral  SpO2: 100%     Weight:  51.7 kg    Height:        Intake/Output Summary (Last 24 hours) at 03/02/2019 1030 Last data filed at 03/02/2019 0488 Gross per 24 hour  Intake 2317.28 ml  Output 2175 ml  Net 142.28 ml   Filed Weights   02/28/19 0500 03/01/19 0500 03/02/19 0453  Weight: 54.5 kg 51.7 kg 51.7 kg    Examination:  General exam: Extremely Deconditioned, debilitated, weak HEENT:PERRL, Ear/Nose normal on gross exam,NG tube Respiratory system: Bilateral equal air entry, normal vesicular breath sounds, no wheezes or crackles  Cardiovascular system: S1 &  S2 heard, RRR. No JVD, murmurs, rubs, gallops or clicks.  Gastrointestinal system: Abdomen is mildly distended, soft ,mild generalized tenderness.  Midline abdominal surgical wound, right lower quadrant and left lower quadrant drains  Central nervous system: Alert and awake  extremities: Bilateral BKA.  Amputation site covered with dressing, amputation of left middle finger Skin: No rashes, lesions no icterus ,no pallor.  Unstageable sacral wound   Data Reviewed: I have personally reviewed following labs and imaging studies  CBC: Recent Labs  Lab 02/24/19 0206 02/24/19 1044 02/25/19 0547 02/26/19 0500 02/26/19 1142 02/28/19 0148  WBC 13.2*  --  12.4* 11.7*  --  9.4  NEUTROABS 9.4*  --   --   --   --  5.9  HGB 7.0* 7.0* 7.0* 6.8* 8.7* 9.5*  HCT 23.7* 23.2* 22.7* 22.7* 28.1* 31.0*  MCV 95.6  --  95.4 96.2  --  95.1  PLT 534*  --  540* 628*  --  211*   Basic Metabolic Panel: Recent Labs  Lab 02/23/19 1040 02/24/19 0206 02/25/19 0547 02/27/19 0548  NA 138 139 139 139  K 4.5 4.6 4.6 4.8  CL 108 108 108 106  CO2 25 23 25 27   GLUCOSE 118* 129* 137* 150*  BUN 43* 42* 44* 42*  CREATININE 0.72 0.72 0.67 0.66  CALCIUM 8.6* 8.7* 8.7* 8.9  MG 2.3 2.2  --  2.0  PHOS 3.4 3.7  --  3.5   GFR: Estimated Creatinine Clearance: 61.4 mL/min (by C-G formula based on SCr of 0.66 mg/dL). Liver Function Tests: Recent Labs  Lab 02/24/19 0206 02/25/19 0547 02/27/19 0548  AST 26 22 25   ALT 21 19 23   ALKPHOS 194* 206* 275*  BILITOT 0.2* 0.5 0.3  PROT 6.4* 6.4* 6.7  ALBUMIN 1.2* 1.2* 1.2*   No results for input(s): LIPASE, AMYLASE in the last 168 hours. No results for input(s): AMMONIA in the last 168 hours. Coagulation Profile: No results for input(s): INR, PROTIME in the last 168 hours. Cardiac Enzymes: No results for input(s): CKTOTAL, CKMB, CKMBINDEX, TROPONINI in the last 168 hours. BNP (last 3 results) No results for input(s): PROBNP in the last 8760 hours. HbA1C: No results  for input(s): HGBA1C in the last 72 hours. CBG: Recent Labs  Lab 03/01/19 1801 03/01/19 1934 03/01/19 2332 03/02/19 0329 03/02/19 0524  GLUCAP 142* 113* 128* 107* 141*   Lipid Profile: No results for input(s): CHOL, HDL, LDLCALC, TRIG, CHOLHDL, LDLDIRECT in the last 72 hours. Thyroid Function Tests: No results for input(s): TSH, T4TOTAL, FREET4, T3FREE, THYROIDAB in the last 72 hours. Anemia Panel: No results for input(s): VITAMINB12, FOLATE, FERRITIN, TIBC, IRON, RETICCTPCT in the last 72 hours. Sepsis Labs: No results for input(s): PROCALCITON, LATICACIDVEN in the last 168 hours.  Recent Results (from the past 240 hour(s))  Aerobic/Anaerobic Culture (surgical/deep wound)     Status: None (Preliminary result)   Collection Time: 02/26/19  3:31 PM   Specimen: Abscess  Result Value Ref Range Status   Specimen Description   Final    ABSCESS LLQ CT DRAIN CATHETER Performed at [facility], [address] [place], [place] [address]    Special Requests   Final    Normal Performed at [facility], [place] [address], [place], [place] [address]    Gram Stain   Final    MODERATE WBC PRESENT,BOTH PMN AND MONONUCLEAR FEW YEAST    Culture   Final    FEW ENTEROCOCCUS FAECALIS FEW STENOTROPHOMONAS MALTOPHILIA MODERATE YEAST IDENTIFICATION AND SUSCEPTIBILITIES TO FOLLOW Performed at [facility], [place] [address] [place], [place] [address]    Report Status PENDING  Incomplete   Organism ID, Bacteria ENTEROCOCCUS FAECALIS  Final      Susceptibility   Enterococcus faecalis - MIC*    AMPICILLIN <=2 SENSITIVE Sensitive     VANCOMYCIN >=32 RESISTANT Resistant     GENTAMICIN SYNERGY SENSITIVE Sensitive     LINEZOLID 2 SENSITIVE Sensitive     * FEW ENTEROCOCCUS FAECALIS         Radiology Studies: No results found.      Scheduled Meds: . chlorhexidine gluconate (MEDLINE KIT)  15 mL Mouth Rinse BID  . Chlorhexidine Gluconate  Cloth  6 each Topical Daily  . collagenase   Topical Daily  . Gerhardt's butt cream   Topical TID  . heparin injection (subcutaneous)  5,000 Units Subcutaneous Q8H  . insulin aspart  0-20 Units Subcutaneous Q6H  . metoprolol tartrate  2.5 mg Intravenous Q6H  . pantoprazole (PROTONIX) IV  40 mg Intravenous Q12H  . sodium chloride flush  10-40 mL Intracatheter Q12H  . sodium chloride flush  5 mL Intracatheter Q8H   Continuous Infusions: . sodium chloride 10 mL/hr at 02/18/19 1833  . sodium chloride 10 mL/hr at 03/02/19 0200  . ampicillin-sulbactam (UNASYN) IV 3 g (03/02/19 0933)  . anidulafungin Stopped (03/01/19 1733)  . TPN ADULT (ION) 90 mL/hr at 03/02/19 0752     LOS: 28 days    Time spent: 35 mins.More than 50% of that time was spent in counseling and/or coordination of care.      Shelly Coss, MD [facility] Pager [phone]  If 7PM-7AM, please contact night-coverage [email or web address] Password [email or web address] 03/02/2019, 10:30 AM

## 2019-03-02 NOTE — Progress Notes (Signed)
PHARMACY - TOTAL PARENTERAL NUTRITION CONSULT NOTE   Indication: Small bowel obstruction  Patient Measurements: Height: 5' 2"  (157.5 cm) Weight: 113 lb 15.7 oz (51.7 kg) IBW/kg (Calculated) : 50.1 TPN AdjBW (KG): 57.9 Body mass index is 20.85 kg/m. Usual Weight: 55-60 kg Ideal Body Weight:  44.2 kg(Adjusted IBW for bilateral BKA)  Assessment:  17 yoF with poorly controlled DM s/p BL BKA, admitted 11/23 with AMS, n/v, abdominal pain. CT abdomen showed pneumoperitoneum and mesenteric ischemia. Underwent ex lap on 11/23 with finding of perforated prepyloric ulcer which was oversewn, along with gross contamination of the peritoneum. Hospital course complicated by DKA, AKI, acute thrombocytopenia, and C. glabrata candidemia. Patient remains with high NGT d/t bowel edema as demonstrated on UGI. TPN started on 11/30 but was stopped shortly thereafter to facilitate central line holiday for candidemia. Repeat blood cultures remain negative to date, and TPN now restarting with replacement of central line. Patient at risk for refeeding given prolonged lack of nutrition.  Glucose / Insulin: Hx DM; prev required D10 W infusion to maintain euglycemia  Current resistant Novolog scale q6h (12/5)   CBGs ( goal <150)  range 113-148, 12 units of Novolog  past 24 hrs, 25 units of regular insulin added to TPN  Electrolytes: WNL, corrected Ca 11.14 (12/17) Renal: AKI on admit, SCr WNL  LFTs / TGs: Alk phos up to 275, Trig 141(22/14) Prealbumin / albumin: both low (11/30), 5.2 (12/4). 5.3 ( 12/7)  7.7 (12/14),  I/O: 1025 ml from JP drain,  1400 ml UOP,  50 ml NGO, 0 ml stool MIVF: NS at 10 ml/hr GI Imaging: 11/30 UGI: high NG output & bowel edema 12/15 CTpersistent leak noted on CT scan with several fluid collections in the ventral abdomen and in pelvis.  Surgeries / Procedures: 11/22 ex lap: oversew of duodenal ulcer with [name] patch 12/16 IR placed drain into ant abd collection  Central access: PICC  replaced 12/3  TPN start date: resumed 12/3 (~ 2200)  Nutritional Goals (per RD recommendation on 12/11): Kcal:  2025-2330 kcal Protein:  115-130 grams Fluid:  >/= 2 L/day Goal TPN rate is 90 mL/hr (provides 129 g of protein and 2047 kcals per day) - GIR 3.3 mg/kg/min Current Nutrition:  NPO Plan: at 1800  Continue custom TPN at 90 mL/hr, the target rate   Electrolytes in TPN:  37mq/L Na, 25 mEq/L K, 3 mEq/L Ca, Magnesium 10 mEq/L , and 10 mmol/L of Phos ; continue Cl:Ac ratio 1:2  Standard MVI TPN on MWF only d/t nProducer, television/film/video Daily trace elements  Continue resistant SSI with q6h CBG checks and adjust as needed   Continue Novolog 25 units in TPN  Continue NS at [facility] on Mon/Thurs  MNetta Cedars PharmD, BCPS 03/02/2019 10:31 AM

## 2019-03-02 NOTE — TOC Transition Note (Addendum)
Transition of Care [facility]) - CM/SW Discharge Note   Patient Details  Name: Dawnette Krogh MRN: [id number] Date of Birth: 1960/09/07  [facility] CM/SW Contact:  Servando Snare, LCSW Phone Number: 03/02/2019, 10:17 AM   Clinical Narrative:   LCSW left message for Danae Chen with [employer] regarding patients insurance authorization.    11:19 AM LCSW spoke with Danae Chen from [employer]. No auth at this time. [employer] has been providing updates to [facility] as requested.        Patient Goals and CMS Choice        Discharge Placement                       Discharge Plan and Services                                     Social Determinants of Health (SDOH) Interventions     Readmission Risk Interventions Readmission Risk Prevention Plan 02/10/2019 11/07/2017  Post Dischage Appt - Patient refused  Medication Screening - Complete  Transportation Screening Complete Complete  PCP follow-up - Patient refused  PCP or Specialist Appt within 3-5 Days Not Complete -  Not Complete comments DC date unknown- pt established with PCP -  [place] or Home Care Consult Complete -  Social Work Consult for [place] Planning/Counseling Not Complete -  SW consult not completed comments awaiting call back from daughter and APS -  Palliative Care Screening Not Complete -  Palliative Care Screening Not Complete Comments pending need -  Medication Review ([place]) Referral to Pharmacy -  Some recent data might be hidden

## 2019-03-03 ENCOUNTER — Inpatient Hospital Stay (HOSPITAL_COMMUNITY): Payer: Medicare HMO

## 2019-03-03 LAB — COMPREHENSIVE METABOLIC PANEL
ALT: 33 U/L (ref 0–44)
AST: 35 U/L (ref 15–41)
Albumin: 1.5 g/dL — ABNORMAL LOW (ref 3.5–5.0)
Alkaline Phosphatase: 369 U/L — ABNORMAL HIGH (ref 38–126)
Anion gap: 7 (ref 5–15)
BUN: 44 mg/dL — ABNORMAL HIGH (ref 6–20)
CO2: 25 mmol/L (ref 22–32)
Calcium: 9.2 mg/dL (ref 8.9–10.3)
Chloride: 111 mmol/L (ref 98–111)
Creatinine, Ser: 0.54 mg/dL (ref 0.44–1.00)
GFR calc Af Amer: >60 mL/min (ref >60)
GFR calc non Af Amer: >60 mL/min (ref >60)
Glucose, Bld: 133 mg/dL — ABNORMAL HIGH (ref 70–99)
Potassium: 4.4 mmol/L (ref 3.5–5.1)
Sodium: 143 mmol/L (ref 135–145)
Total Bilirubin: 0.6 mg/dL (ref 0.3–1.2)
Total Protein: 6.9 g/dL (ref 6.5–8.1)

## 2019-03-03 LAB — AEROBIC/ANAEROBIC CULTURE W GRAM STAIN (SURGICAL/DEEP WOUND): Special Requests: NORMAL

## 2019-03-03 LAB — CBC
HCT: 29.5 % — ABNORMAL LOW (ref 36.0–46.0)
HCT: 30.1 % — ABNORMAL LOW (ref 36.0–46.0)
Hemoglobin: 8.8 g/dL — ABNORMAL LOW (ref 12.0–15.0)
Hemoglobin: 9 g/dL — ABNORMAL LOW (ref 12.0–15.0)
MCH: 28.6 pg (ref 26.0–34.0)
MCH: 29.2 pg (ref 26.0–34.0)
MCHC: 29.8 g/dL — ABNORMAL LOW (ref 30.0–36.0)
MCHC: 29.9 g/dL — ABNORMAL LOW (ref 30.0–36.0)
MCV: 95.8 fL (ref 80.0–100.0)
MCV: 97.7 fL (ref 80.0–100.0)
Platelets: 655 10*3/uL — ABNORMAL HIGH (ref 150–400)
Platelets: 598 10*3/uL — ABNORMAL HIGH (ref 150–400)
RBC: 3.08 MIL/uL — ABNORMAL LOW (ref 3.87–5.11)
RBC: 3.08 MIL/uL — ABNORMAL LOW (ref 3.87–5.11)
RDW: 15.4 % (ref 11.5–15.5)
RDW: 15.6 % — ABNORMAL HIGH (ref 11.5–15.5)
WBC: 9.2 10*3/uL (ref 4.0–10.5)
WBC: 16.1 10*3/uL — ABNORMAL HIGH (ref 4.0–10.5)
nRBC: 0 % (ref 0.0–0.2)
nRBC: 0 % (ref 0.0–0.2)

## 2019-03-03 LAB — GLUCOSE, CAPILLARY
Glucose-Capillary: 124 mg/dL — ABNORMAL HIGH (ref 70–99)
Glucose-Capillary: 143 mg/dL — ABNORMAL HIGH (ref 70–99)
Glucose-Capillary: 190 mg/dL — ABNORMAL HIGH (ref 70–99)
Glucose-Capillary: 250 mg/dL — ABNORMAL HIGH (ref 70–99)

## 2019-03-03 LAB — DIFFERENTIAL
Abs Immature Granulocytes: 0.14 10*3/uL — ABNORMAL HIGH (ref 0.00–0.07)
Basophils Absolute: 0.1 10*3/uL (ref 0.0–0.1)
Basophils Relative: 1 %
Eosinophils Absolute: 0.2 10*3/uL (ref 0.0–0.5)
Eosinophils Relative: 3 %
Immature Granulocytes: 2 %
Lymphocytes Relative: 19 %
Lymphs Abs: 1.7 10*3/uL (ref 0.7–4.0)
Monocytes Absolute: 1.1 10*3/uL — ABNORMAL HIGH (ref 0.1–1.0)
Monocytes Relative: 12 %
Neutro Abs: 5.9 10*3/uL (ref 1.7–7.7)
Neutrophils Relative %: 63 %

## 2019-03-03 LAB — MAGNESIUM: Magnesium: 2.2 mg/dL (ref 1.7–2.4)

## 2019-03-03 LAB — TRIGLYCERIDES: Triglycerides: 153 mg/dL — ABNORMAL HIGH (ref <150)

## 2019-03-03 LAB — PHOSPHORUS: Phosphorus: 3.5 mg/dL (ref 2.5–4.6)

## 2019-03-03 LAB — PREALBUMIN: Prealbumin: 12.7 mg/dL — ABNORMAL LOW (ref 18–38)

## 2019-03-03 MED ORDER — IOHEXOL 350 MG/ML SOLN
100.0000 mL | Freq: Once | INTRAVENOUS | Status: AC | PRN
  Administered 2019-03-03: 100 mL via INTRAVENOUS

## 2019-03-03 MED ORDER — HEPARIN SODIUM (PORCINE) 5000 UNIT/ML IJ SOLN
5000.0000 [IU] | Freq: Three times a day (TID) | INTRAMUSCULAR | Status: DC
  Administered 2019-03-04 – 2019-03-05 (×4): 5000 [IU] via SUBCUTANEOUS
  Filled 2019-03-03 (×4): qty 1

## 2019-03-03 MED ORDER — SULFAMETHOXAZOLE-TRIMETHOPRIM 400-80 MG/5ML IV SOLN
200.0000 mg | Freq: Four times a day (QID) | INTRAVENOUS | Status: DC
  Administered 2019-03-03 – 2019-03-10 (×26): 200 mg via INTRAVENOUS
  Filled 2019-03-03 (×33): qty 12.5

## 2019-03-03 MED ORDER — SODIUM CHLORIDE (PF) 0.9 % IJ SOLN
INTRAMUSCULAR | Status: AC
  Filled 2019-03-03: qty 50

## 2019-03-03 MED ORDER — TRAVASOL 10 % IV SOLN
INTRAVENOUS | Status: AC
  Filled 2019-03-03: qty 1296

## 2019-03-03 NOTE — Progress Notes (Signed)
Referring Physician(s): [place]  Supervising Physician: Arne [place]  Patient Status:  Gina [name] IP  Chief Complaint: Abdominal fluid collection   Subjective: Pt with several small bloody mucous BM's this am per nurse; still having some abd discomfort   Allergies: Bee venom and Metformin and related  Medications: Prior to Admission medications   Medication Sig Start Date End Date Taking? Authorizing Provider  acetaminophen (TYLENOL) 325 MG tablet Take 2 tablets (650 mg total) by mouth every 6 (six) hours as needed for mild pain (or Fever >/= 101). 05/21/18  Yes Roney Jaffe, MD  escitalopram (LEXAPRO) 20 MG tablet TAKE ONE TABLET BY MOUTH EVERY DAY Patient taking differently: Take 20 mg by mouth daily.  04/09/18  Yes Charlott Rakes, MD  gabapentin (NEURONTIN) 100 MG capsule Take 2 capsules (200 mg total) by mouth 2 (two) times daily. 11/28/18 02/02/19 Yes Spongberg, Audie Pinto, MD  insulin aspart (NOVOLOG) 100 UNIT/ML injection Inject 0-15 Units into the skin 3 (three) times daily with meals. 05/21/18  Yes Roney Jaffe, MD  Insulin Glargine (LANTUS) 100 UNIT/ML Solostar Pen Inject 35 Units into the skin daily. 12/30/18  Yes Dhungel, Nishant, MD  Multiple Vitamin (MULTIVITAMIN WITH MINERALS) TABS tablet Take 1 tablet by mouth daily.   Yes [provider]  amLODipine (NORVASC) 5 MG tablet Take 1 tablet (5 mg total) by mouth daily. 11/29/18 12/29/18  Spongberg, Audie Pinto, MD  dextrose 50 % solution Inject 25 mLs (12.5 g total) into the vein as needed for low blood sugar. Patient not taking: Reported on 02/02/2019 05/21/18   Roney Jaffe, MD  doxycycline (VIBRA-TABS) 100 MG tablet Take 1 tablet (100 mg total) by mouth 2 (two) times daily. Patient not taking: Reported on 02/02/2019 12/30/18   Dhungel, Flonnie Overman, MD  polyethylene glycol (MIRALAX) packet Take 17 g by mouth daily as needed for mild constipation. Patient not taking: Reported on 02/02/2019 05/21/18    Roney Jaffe, MD  senna-docusate (SENOKOT-S) 8.6-50 MG tablet Take 2 tablets by mouth at bedtime as needed for mild constipation. Patient not taking: Reported on 02/02/2019 05/21/18   Roney Jaffe, MD     Vital Signs: BP 121/60   Pulse (!) 108   Temp 98.9 F (37.2 C) (Oral)   Resp (!) 25   Ht 5\' 2"  (1.575 m)   Wt 117 lb 8.1 oz (53.3 kg)   LMP 03/29/2015   SpO2 99%   BMI 21.49 kg/m   Physical Exam left abd drain intact, insertion site ok, output about 10-15 cc brown fluid in bag  Imaging: No results found.  Labs:  CBC: Recent Labs    02/25/19 0547 02/26/19 0500 02/26/19 1142 02/28/19 0148 03/03/19 0201  WBC 12.4* 11.7*  --  9.4 9.2  HGB 7.0* 6.8* 8.7* 9.5* 8.8*  HCT 22.7* 22.7* 28.1* 31.0* 29.5*  PLT 540* 628*  --  651* 655*    COAGS: Recent Labs    02/02/19 1832 02/02/19 1952  INR 1.3* 1.2  APTT  --  27    BMP: Recent Labs    02/24/19 0206 02/25/19 0547 02/27/19 0548 03/03/19 0201  NA 139 139 139 143  K 4.6 4.6 4.8 4.4  CL 108 108 106 111  CO2 23 25 27 25   GLUCOSE 129* 137* 150* 133*  BUN 42* 44* 42* 44*  CALCIUM 8.7* 8.7* 8.9 9.2  CREATININE 0.72 0.67 0.66 0.54  GFRNONAA >60 >60 >60 >60  GFRAA >60 >60 >60 >60    LIVER FUNCTION TESTS: Recent  Labs    02/24/19 0206 02/25/19 0547 02/27/19 0548 03/03/19 0201  BILITOT 0.2* 0.5 0.3 0.6  AST 26 22 25  35  ALT 21 19 23  33  ALKPHOS 194* 206* 275* 369*  PROT 6.4* 6.4* 6.7 6.9  ALBUMIN 1.2* 1.2* 1.2* 1.5*    Assessment and Plan:  58 y/o F with history of perforated duodenal ulcer s/p ex lap with oversew of duodenal ulcer 11/23 with subsequent development of peritoneal abscess s/p LLQ drain placement 12/16 ; afebrile; WBC nl; hgb 8.8(9.5), creat nl; left abd drain cx- enterococcus, candida glabrata, stenotrophomonas maltophilia; check f/u CT/drain injection once output minimal for 2-3 days; other plans as per CCS   Electronically Signed: D. Rowe Robert, PA-C 03/03/2019, 11:08 AM   I  spent a total of 15 minutes at the the patient's bedside AND on the patient's hospital floor or unit, greater than 50% of which was counseling/coordinating care for left abdominal abscess drain    Patient ID: Gina [name], female   DOB: 20-May-1960, 58 y.o.   MRN: [id number]

## 2019-03-03 NOTE — Progress Notes (Signed)
Pharmacy Antibiotic Note  Gina [name] is a 58 y.o. female with perforated gastric ulcer & complicated post-op course.  Pharmacy has been consulted for Unasyn dosing.  03/03/2019:  D30 total abx (D2 Unasyn, D28 Eraxis)  Afebrile, WBC inc 16.1, Scr stable  Cx of abscess + VRE (sensitive ampicillin) , stenotrophomonas (S=Bactrim, R=Levaquin)  Plan: Bactrim 200mg  IV q6h Continue Unasyn 3gm IV q8h Monitor renal function and cx data   Height: 5\' 2"  (157.5 cm) Weight: 117 lb 8.1 oz (53.3 kg) IBW/kg (Calculated) : 50.1  Temp (24hrs), Avg:98.3 F (36.8 C), Min:97.8 F (36.6 C), Max:98.9 F (37.2 C)  Recent Labs  Lab 02/25/19 0547 02/26/19 0500 02/27/19 0548 02/28/19 0148 03/03/19 0201 03/03/19 1300  WBC 12.4* 11.7*  --  9.4 9.2 16.1*  CREATININE 0.67  --  0.66  --  0.54  --     Estimated Creatinine Clearance: 61.4 mL/min (by C-G formula based on SCr of 0.54 mg/dL).    Allergies  Allergen Reactions  . Bee Venom Swelling    Swells up with bee stings   . Metformin And Related Other (See Comments)    Upset stomach    Antimicrobials this admission: 11/22 Vanc/Cefepime/Flagyl >> 11/23   Zosyn 11/23 >> 12/06 >> 12/8 >> 12/19 11/22 Fluconazole >> Eraxis 11/24 >> 12/15  12/17>> 12/19 Unasyn>>  12/21 Bactrim>>  Microbiology results: 11/22 BCx x1: C glabrata S-echinocandins, Diflucan (MIC 16; dose dependent) - MICs for AmphoB, Vori, Itra, Posa, flucytosine, but no breakpoints given 11/22 UCx: Klebsiella (R to ampicillin, I to nitro) 11/22 Cov2: negative 11/23 peritoneal culture: C.albicans, C glabrata 11/23 MRSA PCR: negative 11/25 HCV Ab+ 11/24 BCx: NG-final 11/28 BCx: ng-final 12/8: BCx: NGF 12/16 Abscess LLQ drain catheter: enterococcus faecalis (R=Vanc; S=amp); stenotrophomonas (S= Bactrim, R-LVQ)  Thank you for allowing pharmacy to be a part of this patient's care.  Netta Cedars, PharmD, BCPS 03/03/2019 2:48 PM

## 2019-03-03 NOTE — Progress Notes (Signed)
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  She was found to have perforated gastric ulcer with pneumoperitoneum, also had evidence DKA.  She was intubated and admitted to ICU.  Started on broad-spectrum antibiotics, pressors, IV fluids.  She underwent expiratory laparotomy 11/23 with finding of perforated prepyloric ulcer which was repaired, contaminated peritoneal fluid was drained.  She was extubated on 11/29.  She had extensive complicated hospital course with DKA, AKI, acute blood loss anemia, thrombocytopenia, intra-abdominal infection, bacteremia, fungemia.    General surgery closely following.  Currently on IV antibiotics.  Abdominal CT done on 12/15 showed persistent abdominal collection,leak.  IR placed left lower quadrant anterior peritoneal drain catheter on 02/26/19.Restarted on eraxis after aerobic/anaerobic culture showed yeast.ID also following.   Assessment & Plan:   Principal Problem:   Septic shock ([place]) Active Problems:   DKA (diabetic ketoacidoses) ([place])   Severe protein-calorie malnutrition ([place])   Acute kidney injury ([place])   Pressure injury of skin   Perforated gastric ulcer ([place])   Acute respiratory failure ([place])   Duodenal perforation ([facility])   Below-knee amputee ([place])   Atherosclerosis of native arteries of extremities with gangrene, right leg (H[facility])   Atherosclerosis of native arteries of extremities with gangrene, left leg ([facility])   Septic shock due to ruptured gastric ulcer with acute peritonitis, fungemia, UTI: S/pex lap with oversew of duodenal ulcer,with Graham patch 11/23,DrLucia Gaskins, for perforated  prepyloric ulcer with gross contamination.General surgery following. ID was consulted and following. Blood cultures had shown Candida glabrata and she completed the course for fungemia.  Urine culture showed Klebsiella pneumoniae.  Peritoneal fluid culture showed Candida albicans. No evidence of fungal endophthalmitis after ophthalmology consultation.. CT abdomen done on 12/15 showed persistent large abscess, persistent fluid collection around the ventral abdomen,possible leak.IR placed left lower quadrant anterior peritoneal drain catheter on 12/26.[name] Aerobic/anaerobic culture now again showed candida glabrata Enterococcus faecalis, stenotrophomonas.  Restarted on Eraxis.Currently on unasyn.She has a RLQ JP drain with suction and midline surgical wound.Has NG tube  Ischemic changes at bilateral BKA site: Possibly secondary to severe septic shock.  She was seen by vascular surgery, orthopedics.  She will eventually need above-knee amputation when she is more stable.  Patient declined surgery at this time so continue current medical treatment  Acute kidney injury: Back to baseline now  ICU delirium: Secondary to infectious etiology, hospital admission, ICU environment.  Mental status has improved and she is currently alert and oriented.  DKA with severely uncontrolled hyperglycemia: Resolved.  Hemoglobin A1c of 15.2 as per 11/23.  Continue current insulin regimen  Severe protein calorie malnutrition with hypoalbuminemia: Started on TPN.   Dietitian following  Anemia due to acute blood loss, critical illness:  Total of 3 units so far this hospitalization.  Continue to monitor CBC  Acute thrombocytopenia: Secondary to septic shock.  Resolved  Status post bilateral BKA, left phalanx amputation: Hand surgery and orthopedics were following.  Debility/deconditioning/poor prognosis: Extremely sick patient.  Completed hospital course.  Palliative  care was consulted for goals of care discussion.  Had  meeting with family on 12/9.  Family wanted more time to consider further goals of care discussion.  They are interested on  placing her to LTAC.  Case manager/SW following.As per CM today,her insurance denied for [facility].Appeal being put forward.  Pressure Injury 12/26/18 Buttocks Left Stage II -  Partial thickness loss of dermis presenting as a shallow open ulcer with a red, pink wound bed without slough. (Active)  12/26/18 0630  Location: Buttocks  Location Orientation: Left  Staging: Stage II -  Partial thickness loss of dermis presenting as a shallow open ulcer with a red, pink wound bed without slough.  Wound Description (Comments):   Present on Admission: Yes     Pressure Injury 12/26/18 Buttocks Right;Lower Stage II -  Partial thickness loss of dermis presenting as a shallow open ulcer with a red, pink wound bed without slough. (Active)  12/26/18 0630  Location: Buttocks  Location Orientation: Right;Lower  Staging: Stage II -  Partial thickness loss of dermis presenting as a shallow open ulcer with a red, pink wound bed without slough.  Wound Description (Comments):   Present on Admission: Yes     Pressure Injury 02/03/19 Coccyx Right;Left;Medial Unstageable - Full thickness tissue loss in which the base of the ulcer is covered by slough (yellow, tan, gray, green or brown) and/or eschar (tan, brown or black) in the wound bed. pt has 3 small injuri (Active)  02/03/19 0230  Location: Coccyx  Location Orientation: Right;Left;Medial  Staging: Unstageable - Full thickness tissue loss in which the base of the ulcer is covered by slough (yellow, tan, gray, green or brown) and/or eschar (tan, brown or black) in the wound bed.  Wound Description (Comments): pt has 3 small injuries all in the same area, one is medial and two are on either side of it  Present on Admission: Yes           Nutrition Problem: Inadequate oral intake Etiology: altered GI function(pyloric channel ulcer  perforation s/p ex lap)      DVT prophylaxis:Heparin [place] Code Status: Full Family Communication: Discussed with daughter on phone on 03/02/19 Disposition Plan: LTACH ?  Anticipate prolonged hospitalization.  Not a stable for discharge.  Consultants: Neurosurgery, PCCM, ID  Procedures: Exploratory laparotomy  Antimicrobials:  Anti-infectives (From admission, onward)   Start     Dose/Rate Route Frequency Ordered Stop   03/01/19 1800  Ampicillin-Sulbactam (UNASYN) 3 g in sodium chloride 0.9 % 100 mL IVPB     3 g 200 mL/hr over 30 Minutes Intravenous Every 8 hours 03/01/19 1304     02/28/19 1600  anidulafungin (ERAXIS) 100 mg in sodium chloride 0.9 % 100 mL IVPB     100 mg 78 mL/hr over 100 Minutes Intravenous Every 24 hours 02/27/19 1346     02/27/19 1600  anidulafungin (ERAXIS) 200 mg in sodium chloride 0.9 % 200 mL IVPB     200 mg 78 mL/hr over 200 Minutes Intravenous  Once 02/27/19 1346 02/27/19 1840   02/18/19 1800  piperacillin-tazobactam (ZOSYN) IVPB 3.375 g  Status:  Discontinued     3.375 g 12.5 mL/hr over 240 Minutes Intravenous Every 8 hours 02/18/19 1652 03/01/19 1259   02/05/19 1000  anidulafungin (ERAXIS) 100 mg in sodium chloride 0.9 % 100 mL IVPB  Status:  Discontinued     100 mg 78 mL/hr over 100 Minutes Intravenous Every 24 hours 02/04/19 0840 02/25/19 1604   02/04/19 1000  anidulafungin (ERAXIS) 200 mg in sodium chloride 0.9 % 200 mL IVPB     200 mg 78 mL/hr over 200 Minutes Intravenous  Once 02/04/19 0840 02/04/19 1301  02/04/19 0200  fluconazole (DIFLUCAN) IVPB 200 mg  Status:  Discontinued     200 mg 100 mL/hr over 60 Minutes Intravenous Every 24 hours 02/02/19 2302 02/04/19 0840   02/03/19 1800  vancomycin (VANCOCIN) IVPB 750 mg/150 ml premix  Status:  Discontinued     750 mg 150 mL/hr over 60 Minutes Intravenous Every 24 hours 02/03/19 0241 02/03/19 0242   02/03/19 1800  vancomycin (VANCOCIN) IVPB 750 mg/150 ml premix  Status:  Discontinued     750  mg 150 mL/hr over 60 Minutes Intravenous Every 24 hours 02/03/19 0242 02/03/19 0858   02/03/19 0600  piperacillin-tazobactam (ZOSYN) IVPB 3.375 g     3.375 g 12.5 mL/hr over 240 Minutes Intravenous Every 8 hours 02/03/19 0243 02/17/19 0205   02/02/19 2315  fluconazole (DIFLUCAN) IVPB 400 mg     400 mg 100 mL/hr over 120 Minutes Intravenous STAT 02/02/19 2301 02/03/19 0218   02/02/19 1800  ceFEPIme (MAXIPIME) 2 g in sodium chloride 0.9 % 100 mL IVPB     2 g 200 mL/hr over 30 Minutes Intravenous  Once 02/02/19 1745 02/02/19 2030   02/02/19 1800  metroNIDAZOLE (FLAGYL) IVPB 500 mg     500 mg 100 mL/hr over 60 Minutes Intravenous  Once 02/02/19 1745 02/02/19 2237   02/02/19 1800  vancomycin (VANCOCIN) IVPB 1000 mg/200 mL premix  Status:  Discontinued     1,000 mg 200 mL/hr over 60 Minutes Intravenous  Once 02/02/19 1745 02/02/19 1747   02/02/19 1800  vancomycin (VANCOCIN) 1,250 mg in sodium chloride 0.9 % 250 mL IVPB     1,250 mg 166.7 mL/hr over 90 Minutes Intravenous  Once 02/02/19 1747 02/02/19 2237      Subjective: Patient seen and examined the bedside this morning.  Hemodynamically stable.  Looked comfortable and much better today.  Very alert, awake and communicates well.  Complains of generalized abdominal discomfort.  She says she is hungry.  Objective: Vitals:   03/03/19 0700 03/03/19 0800 03/03/19 0900 03/03/19 1000  BP:  130/66  121/60  Pulse: 82 84 90 95  Resp: (!) _0 Temp:  98.9 F (37.2 C)    TempSrc:  Oral    SpO2:      Weight:      Height:        Intake/Output Summary (Last 24 hours) at 03/03/2019 1103 Last data filed at 03/03/2019 0800 Gross per 24 hour  Intake 2592.84 ml  Output 2325 ml  Net 267.84 ml   Filed Weights   03/01/19 0500 03/02/19 0453 03/03/19 0500  Weight: 51.7 kg 51.7 kg 53.3 kg    Examination:  General exam: Extremely Deconditioned, debilitated, weak HEENT:PERRL, Ear/Nose normal on gross exam,NG tube Respiratory system:  Bilateral equal air entry, normal vesicular breath sounds, no wheezes or crackles  Cardiovascular system: S1 & S2 heard, RRR. No JVD, murmurs, rubs, gallops or clicks.  Gastrointestinal system: Abdomen is mildly distended, soft ,mild generalized tenderness. Bowel sounds not heard . Midline abdominal surgical wound, right lower quadrant and left lower quadrant drains  Central nervous system: Alert and awake  extremities: Bilateral BKA.  Amputation site covered with dressing, amputation of left middle finger Skin: No rashes, lesions no icterus ,no pallor.  Unstageable sacral wound   Data Reviewed: I have personally reviewed following labs and imaging studies  CBC: Recent Labs  Lab 02/25/19 0547 02/26/19 0500 02/26/19 1142 02/28/19 0148 03/03/19 0201  WBC 12.4* 11.7*  --  9.4 9.2  NEUTROABS  --   --   --  5.9 5.9  HGB 7.0* 6.8* 8.7* 9.5* 8.8*  HCT 22.7* 22.7* 28.1* 31.0* 29.5*  MCV 95.4 96.2  --  95.1 95.8  PLT 540* 628*  --  651* 147*   Basic Metabolic Panel: Recent Labs  Lab 02/25/19 0547 02/27/19 0548 03/03/19 0201  NA 139 139 143  K 4.6 4.8 4.4  CL 108 106 111  CO2 _0 GLUCOSE 137* 150* 133*  BUN 44* 42* 44*  CREATININE 0.67 0.66 0.54  CALCIUM 8.7* 8.9 9.2  MG  --  2.0 2.2  PHOS  --  3.5 3.5   GFR: Estimated Creatinine Clearance: 61.4 mL/min (by C-G formula based on SCr of 0.54 mg/dL). Liver Function Tests: Recent Labs  Lab 02/25/19 0547 02/27/19 0548 03/03/19 0201  AST 22 25 35  ALT 19 23 33  ALKPHOS 206* 275* 369*  BILITOT 0.5 0.3 0.6  PROT 6.4* 6.7 6.9  ALBUMIN 1.2* 1.2* 1.5*   No results for input(s): LIPASE, AMYLASE in the last 168 hours. No results for input(s): AMMONIA in the last 168 hours. Coagulation Profile: No results for input(s): INR, PROTIME in the last 168 hours. Cardiac Enzymes: No results for input(s): CKTOTAL, CKMB, CKMBINDEX, TROPONINI in the last 168 hours. BNP (last 3 results) No results for input(s): PROBNP in the last 8760  hours. HbA1C: No results for input(s): HGBA1C in the last 72 hours. CBG: Recent Labs  Lab 03/02/19 0524 03/02/19 1203 03/02/19 1718 03/02/19 2333 03/03/19 0612  GLUCAP 141* 208* 116* 167* 143*   Lipid Profile: Recent Labs    03/03/19 0201  TRIG 153*   Thyroid Function Tests: No results for input(s): TSH, T4TOTAL, FREET4, T3FREE, THYROIDAB in the last 72 hours. Anemia Panel: No results for input(s): VITAMINB12, FOLATE, FERRITIN, TIBC, IRON, RETICCTPCT in the last 72 hours. Sepsis Labs: No results for input(s): PROCALCITON, LATICACIDVEN in the last 168 hours.  Recent Results (from the past 240 hour(s))  Aerobic/Anaerobic Culture (surgical/deep wound)     Status: None (Preliminary result)   Collection Time: 02/26/19  3:31 PM   Specimen: Abscess  Result Value Ref Range Status   Specimen Description   Final    ABSCESS LLQ CT DRAIN CATHETER Performed at [facility], [address] [place], [place] [address]    Special Requests   Final    Normal Performed at [facility], [place] [address], [place], [place] [address]    Gram Stain   Final    MODERATE WBC PRESENT,BOTH PMN AND MONONUCLEAR FEW YEAST Performed at [facility], [place] [address] [place],  [address]    Culture   Final    FEW ENTEROCOCCUS FAECALIS FEW STENOTROPHOMONAS MALTOPHILIA MODERATE CANDIDA GLABRATA NO ANAEROBES ISOLATED; CULTURE IN PROGRESS FOR 5 DAYS    Report Status PENDING  Incomplete   Organism ID, Bacteria ENTEROCOCCUS FAECALIS  Final   Organism ID, Bacteria STENOTROPHOMONAS MALTOPHILIA  Final      Susceptibility   Enterococcus faecalis - MIC*    AMPICILLIN <=2 SENSITIVE Sensitive     VANCOMYCIN >=32 RESISTANT Resistant     GENTAMICIN SYNERGY SENSITIVE Sensitive     LINEZOLID 2 SENSITIVE Sensitive     * FEW ENTEROCOCCUS FAECALIS   Stenotrophomonas maltophilia - MIC*    LEVOFLOXACIN >=8 RESISTANT Resistant     TRIMETH/SULFA <=20 SENSITIVE  Sensitive     * FEW STENOTROPHOMONAS MALTOPHILIA  Radiology Studies: No results found.      Scheduled Meds: . chlorhexidine gluconate (MEDLINE KIT)  15 mL Mouth Rinse BID  . Chlorhexidine Gluconate Cloth  6 each Topical Daily  . collagenase   Topical Daily  . Gerhardt's butt cream   Topical TID  . [START ON 03/04/2019] heparin injection (subcutaneous)  5,000 Units Subcutaneous Q8H  . insulin aspart  0-20 Units Subcutaneous Q6H  . metoprolol tartrate  2.5 mg Intravenous Q6H  . pantoprazole (PROTONIX) IV  40 mg Intravenous Q12H  . sodium chloride flush  10-40 mL Intracatheter Q12H  . sodium chloride flush  5 mL Intracatheter Q8H   Continuous Infusions: . sodium chloride 10 mL/hr at 02/18/19 1833  . sodium chloride 10 mL/hr at 03/02/19 0200  . ampicillin-sulbactam (UNASYN) IV Stopped (03/03/19 1011)  . anidulafungin Stopped (03/02/19 1840)  . TPN ADULT (ION) 90 mL/hr at 03/03/19 0800  . TPN ADULT (ION)       LOS: 29 days    Time spent: 35 mins.More than 50% of that time was spent in counseling and/or coordination of care.      Shelly Coss, MD [facility] Pager [phone]  If 7PM-7AM, please contact night-coverage [email or web address] Password [email or web address] 03/03/2019, 11:03 AM

## 2019-03-03 NOTE — Progress Notes (Signed)
PHARMACY - TOTAL PARENTERAL NUTRITION CONSULT NOTE   Indication: Small bowel obstruction  Patient Measurements: Height: _0  (157.5 cm) Weight: 117 lb 8.1 oz (53.3 kg) IBW/kg (Calculated) : 50.1 TPN AdjBW (KG): 57.9 Body mass index is 21.49 kg/m. Usual Weight: 55-60 kg Ideal Body Weight:  44.2 kg(Adjusted IBW for bilateral BKA)  Assessment:  40 yoF with poorly controlled DM s/p BL BKA, admitted 11/23 with AMS, n/v, abdominal pain. CT abdomen showed pneumoperitoneum and mesenteric ischemia. Underwent ex lap on 11/23 with finding of perforated prepyloric ulcer which was oversewn, along with gross contamination of the peritoneum. Hospital course complicated by DKA, AKI, acute thrombocytopenia, and C. glabrata candidemia. Patient remains with high NGT d/t bowel edema as demonstrated on UGI. TPN started on 11/30 but was stopped shortly thereafter to facilitate central line holiday for candidemia. Repeat blood cultures remain negative to date, and TPN now restarting with replacement of central line. Patient at risk for refeeding given prolonged lack of nutrition.  Glucose / Insulin: Hx DM; prev required D10 W infusion to maintain euglycemia  Current resistant Novolog scale q6h (12/5)   CBGs ( goal <150)  range 116-208, 14 units of Novolog  past 24 hrs, 25 units of regular insulin added to TPN  Electrolytes: WNL, corrected elevated at Ca 11.2 (12/21) Renal: AKI on admit, SCr WNL  LFTs / TGs: WNL except Alk phos up to 369, Trig 153 (12/21) Prealbumin / albumin: both low (11/30), 5.2 (12/4). 5.3 ( 12/7)  7.7 (12/14), 12.7 (12/21) I/O: 1000 ml from JP drain,  1450 ml UOP,  250 ml NGO, 0 ml stool MIVF: NS at 10 ml/hr GI Imaging: 11/30 UGI: high NG output & bowel edema 12/15 CTpersistent leak noted on CT scan with several fluid collections in the ventral abdomen and in pelvis.  Surgeries / Procedures: 11/22 ex lap: oversew of duodenal ulcer with [name] patch 12/16 IR placed drain into ant abd  collection  Central access: PICC replaced 12/3  TPN start date: resumed 12/3 (~ 2200)  Nutritional Goals (per RD recommendation on 12/11): Kcal:  2025-2330 kcal Protein:  115-130 grams Fluid:  >/= 2 L/day Goal TPN rate is 90 mL/hr (provides 129 g of protein and 2047 kcals per day) - GIR 3.3 mg/kg/min Current Nutrition:  NPO  Plan: at 1800  Continue custom TPN at 90 mL/hr (goal rate)   Electrolytes in TPN:  43mq/L Na, 25 mEq/L K, NO Ca, Magnesium 10 mEq/L , and 10 mmol/L of Phos ; continue Cl:Ac ratio 1:2  Standard MVI TPN on MWF only d/t nProducer, television/film/video Daily trace elements  Continue resistant SSI with q6h CBG checks and adjust as needed   Increase Novolog 30 units in TPN  Continue NS at [facility] on Mon/Thurs  MNetta Cedars PharmD, BCPS 03/03/2019 7:44 AM

## 2019-03-03 NOTE — Progress Notes (Signed)
Patient ID: Gina [name], female   DOB: 1960/07/22, 58 y.o.   MRN: [id number]    29 Days Post-Op  Subjective: Patient wanting to talk to her daughter on the phone.  Says she is having some abdominal discomfort this morning, but unable to really explain or express why/how.    ROS: See above, otherwise other systems negative  Objective: Vital signs in last 24 hours: Temp:  [97.6 F (36.4 C)-98.5 F (36.9 C)] 98 F (36.7 C) (12/21 0400) Pulse Rate:  [82-93] 84 (12/21 0800) Resp:  [12-23] 16 (12/21 0800) BP: (130-159)/(64-87) 130/66 (12/21 0800) SpO2:  [99 %] 99 % (12/20 2000) Weight:  [53.3 kg] 53.3 kg (12/21 0500) Last BM Date: 02/23/19  Intake/Output from previous day: 12/20 0701 - 12/21 0700 In: 2355.7 [I.V.:1925.6; IV Piggyback:430.1] Out: 2525 [Urine:1300; Emesis/NG output:450; Drains:775] Intake/Output this shift: Total I/O In: 405.1 [I.V.:405.1] Out: -   PE: Abd: soft, ND, doesn't seem tender to palpation, surgical JP drain with new onset bloody output, not frankly bloody, but with blood mixed in, 775cc/24 hrs.  IR drain in LUQ with brownish cloudy serous output.  Midline wound is clean and packed with gauze.  NGT with bilious output 450cc/24hrs.  Lab Results:  Recent Labs    03/03/19 0201  WBC 9.2  HGB 8.8*  HCT 29.5*  PLT 655*   BMET Recent Labs    03/03/19 0201  NA 143  K 4.4  CL 111  CO2 25  GLUCOSE 133*  BUN 44*  CREATININE 0.54  CALCIUM 9.2   PT/INR No results for input(s): LABPROT, INR in the last 72 hours. CMP     Component Value Date/Time   NA 143 03/03/2019 0201   NA 139 10/04/2017 1705   K 4.4 03/03/2019 0201   CL 111 03/03/2019 0201   CO2 25 03/03/2019 0201   GLUCOSE 133 (H) 03/03/2019 0201   BUN 44 (H) 03/03/2019 0201   BUN 11 10/04/2017 1705   CREATININE 0.54 03/03/2019 0201   CREATININE 0.68 04/28/2016 1640   CALCIUM 9.2 03/03/2019 0201   PROT 6.9 03/03/2019 0201   PROT 7.1 10/04/2017 1705   ALBUMIN 1.5 (L) 03/03/2019 0201     ALBUMIN 3.7 10/04/2017 1705   AST 35 03/03/2019 0201   ALT 33 03/03/2019 0201   ALKPHOS 369 (H) 03/03/2019 0201   BILITOT 0.6 03/03/2019 0201   BILITOT <0.2 10/04/2017 1705   GFRNONAA >60 03/03/2019 0201   GFRNONAA >89 04/28/2016 1640   GFRAA >60 03/03/2019 0201   GFRAA >89 04/28/2016 1640   Lipase     Component Value Date/Time   LIPASE 27 04/01/2016 1040       Studies/Results: No results found.  Anti-infectives: Anti-infectives (From admission, onward)   Start     Dose/Rate Route Frequency Ordered Stop   03/01/19 1800  Ampicillin-Sulbactam (UNASYN) 3 g in sodium chloride 0.9 % 100 mL IVPB     3 g 200 mL/hr over 30 Minutes Intravenous Every 8 hours 03/01/19 1304     02/28/19 1600  anidulafungin (ERAXIS) 100 mg in sodium chloride 0.9 % 100 mL IVPB     100 mg 78 mL/hr over 100 Minutes Intravenous Every 24 hours 02/27/19 1346     02/27/19 1600  anidulafungin (ERAXIS) 200 mg in sodium chloride 0.9 % 200 mL IVPB     200 mg 78 mL/hr over 200 Minutes Intravenous  Once 02/27/19 1346 02/27/19 1840   02/18/19 1800  piperacillin-tazobactam (ZOSYN) IVPB 3.375 g  Status:  Discontinued     3.375 g 12.5 mL/hr over 240 Minutes Intravenous Every 8 hours 02/18/19 1652 03/01/19 1259   02/05/19 1000  anidulafungin (ERAXIS) 100 mg in sodium chloride 0.9 % 100 mL IVPB  Status:  Discontinued     100 mg 78 mL/hr over 100 Minutes Intravenous Every 24 hours 02/04/19 0840 02/25/19 1604   02/04/19 1000  anidulafungin (ERAXIS) 200 mg in sodium chloride 0.9 % 200 mL IVPB     200 mg 78 mL/hr over 200 Minutes Intravenous  Once 02/04/19 0840 02/04/19 1301   02/04/19 0200  fluconazole (DIFLUCAN) IVPB 200 mg  Status:  Discontinued     200 mg 100 mL/hr over 60 Minutes Intravenous Every 24 hours 02/02/19 2302 02/04/19 0840   02/03/19 1800  vancomycin (VANCOCIN) IVPB 750 mg/150 ml premix  Status:  Discontinued     750 mg 150 mL/hr over 60 Minutes Intravenous Every 24 hours 02/03/19 0241 02/03/19 0242    02/03/19 1800  vancomycin (VANCOCIN) IVPB 750 mg/150 ml premix  Status:  Discontinued     750 mg 150 mL/hr over 60 Minutes Intravenous Every 24 hours 02/03/19 0242 02/03/19 0858   02/03/19 0600  piperacillin-tazobactam (ZOSYN) IVPB 3.375 g     3.375 g 12.5 mL/hr over 240 Minutes Intravenous Every 8 hours 02/03/19 0243 02/17/19 0205   02/02/19 2315  fluconazole (DIFLUCAN) IVPB 400 mg     400 mg 100 mL/hr over 120 Minutes Intravenous STAT 02/02/19 2301 02/03/19 0218   02/02/19 1800  ceFEPIme (MAXIPIME) 2 g in sodium chloride 0.9 % 100 mL IVPB     2 g 200 mL/hr over 30 Minutes Intravenous  Once 02/02/19 1745 02/02/19 2030   02/02/19 1800  metroNIDAZOLE (FLAGYL) IVPB 500 mg     500 mg 100 mL/hr over 60 Minutes Intravenous  Once 02/02/19 1745 02/02/19 2237   02/02/19 1800  vancomycin (VANCOCIN) IVPB 1000 mg/200 mL premix  Status:  Discontinued     1,000 mg 200 mL/hr over 60 Minutes Intravenous  Once 02/02/19 1745 02/02/19 1747   02/02/19 1800  vancomycin (VANCOCIN) 1,250 mg in sodium chloride 0.9 % 250 mL IVPB     1,250 mg 166.7 mL/hr over 90 Minutes Intravenous  Once 02/02/19 1747 02/02/19 2237       Assessment/Plan VDRF- extubated 11/29 Poorly controlled DM2- A1c15.2 S/p bilateral BKA Ischemic changes to both LE- defer to medicineandDr. 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  POD29,S/pex lap with oversew of duodenal ulcer,with Graham patch 11/23,Dr. Lucia Gaskins, for perforated prepyloric ulcer with gross contamination -persistent leak noted on CT scan with several fluid collections in the ventral abdomen and in  pelvis.cont IR drain and eraxis restarted due to cultures with yeast still present - BID wet to dry dressing changes to midline abdominal wound - BID PPI -pulm toilet and IS -OOB to chair as much as possible -mobilize, PT/OT -cont TNAgiven leak, will be unable to take in enteral nutrition for the foreseeable future -will cont JP Drain to suction to help with drainage of all the bile from her leak. -cont conservative management as surgical intervention would be more likely to cause more complications than help at this point due to time from initial operation,  SPCM, multiple comorbidities, etc. -agree with LTAC placement when able -ID has switched to unasyn, duration per them at this point   FEN -NPO/NGT VTE - NoSCDs,Bilateral BKa's - sq heparin (hold today for some blood in surgical drain) ID - Eraxis 11/24>>restarted on 12/17-->Zosyn 11/23 >>12/6, 12/8 -->12/18, unasyn 12/18 --> Foley -out Follow up -Dr. Lucia Gaskins   LOS: 29 days    Henreitta Cea , [facility] Surgery 03/03/2019, 8:26 AM Please see Amion for pager number during day hours 7:00am-4:30pm

## 2019-03-03 NOTE — Progress Notes (Signed)
Physical Therapy Treatment Patient Details Name: Gina [name] MRN: [id number] DOB: 1961/03/03 Today's Date: 03/03/2019    History of Present Illness 58 yo female admitted with septic shock, perforated ulcer. S/P ex lap, oversewing of duodenal ulcer 11/23. Extubated 11/29.  IR drain placed 12/16. Hx of bil BKA (2019/2020), DM, schizophrenia.    PT Comments    Pt assisted with rolling and hygiene with bed pad change due to BM. Nurse tech in to assist as well.  Pt with sacral pressure injury "burning" pain which limited her assist and cooperation.  Pt requiring total assist for bed mobility due to this.      Follow Up Recommendations  SNF;LTACH(f/u PT at SNF or LTAC per MD)     Equipment Recommendations  None recommended by PT    Recommendations for Other Services       Precautions / Restrictions Precautions Precautions: Fall Precaution Comments: bil BKA, NG tube, multiple lines/leads, NPO, R jp drain to suction. New IR drain to R lower abd placed 12/16    Mobility  Bed Mobility Overal bed mobility: Needs Assistance Bed Mobility: Rolling Rolling: Total assist;+2 for physical assistance         General bed mobility comments: performed rolling and requested pt assist as able however pt with pain and inconsistent with assist, Nurse tech assisted with rolling and pericare, pt crying out in pain "burning" of sacral pressure injury; pt becoming more upset due to pain however had 2 mucous type BMs while attempting to perform hygiene so did not further continue with supine to sit  Transfers                    Ambulation/Gait                 Stairs             Wheelchair Mobility    Modified Rankin (Stroke Patients Only)       Balance                                            Cognition Arousal/Alertness: Awake/alert Behavior During Therapy: WFL for tasks assessed/performed Overall Cognitive Status: No family/caregiver present  to determine baseline cognitive functioning Area of Impairment: Problem solving;Memory;Following commands                     Memory: Decreased short-term memory Following Commands: Follows one step commands with increased time     Problem Solving: Slow processing;Requires verbal cues;Requires tactile cues;Decreased initiation        Exercises      General Comments        Pertinent Vitals/Pain Pain Assessment: Faces Faces Pain Scale: Hurts even more Pain Location: buttocks Pain Descriptors / Indicators: Burning Pain Intervention(s): Monitored during session;Repositioned    Home Living                      Prior Function            PT Goals (current goals can now be found in the care plan section) Acute Rehab PT Goals PT Goal Formulation: With patient Time For Goal Achievement: 03/17/19 Potential to Achieve Goals: Fair Progress towards PT goals: Not progressing toward goals - comment(pt with pain and less willing to cooperate/assist)    Frequency    Min 2X/week  PT Plan Current plan remains appropriate    Co-evaluation              AM-PAC PT "6 Clicks" Mobility   Outcome Measure  Help needed turning from your back to your side while in a flat bed without using bedrails?: Total Help needed moving from lying on your back to sitting on the side of a flat bed without using bedrails?: Total Help needed moving to and from a bed to a chair (including a wheelchair)?: Total Help needed standing up from a chair using your arms (e.g., wheelchair or bedside chair)?: Total Help needed to walk in hospital room?: Total Help needed climbing 3-5 steps with a railing? : Total 6 Click Score: 6    End of Session   Activity Tolerance: Patient limited by fatigue Patient left: with call bell/phone within reach;in bed;with bed alarm set;with nursing/sitter in room   PT Visit Diagnosis: Muscle weakness (generalized) (M62.81);Other abnormalities of  gait and mobility (R26.89)     Time: [id number] PT Time Calculation (min) (ACUTE ONLY): 26 min  Charges:  $Therapeutic Activity: 23-37 mins                     Jannette Spanner PT, DPT Acute Rehabilitation Services Office: [phone]   Trena Platt 03/03/2019, 4:39 PM

## 2019-03-03 NOTE — Consult Note (Addendum)
[facility] Consult: 1:14 PM 03/03/2019  LOS: 29 days    Referring Provider: Dr Tawanna Solo Primary Care Physician:  [name], MD Primary Gastroenterologist:  unassigned     Reason for Consultation:  Gi bleeding.     HPI: Gina [name] is a 58 y.o. female.  Hx IDDM, poorly controlled.  DKA. Osteomyelitis.  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  Asthma.  Anxiety, depression, schizophrenia.  Htn.  Iron def anemia attributed to menorrhagia in 2014, to anemia of chronic dz in 2018.  Received blood transfusions in 05/2016 for leg amputation associated blood loss anemia.  Protein malnutrition 2018.  Labial cellulitis. Currently 3rd admission for 2020.   Left hand cellulitis, DKA (glucose 880) admission 12/2018. No prior colonoscopy or EGD.    Admission close to 1 month ago w sepsis, DKA (A1c 15.2), perforated prepyloric ulcer w gross contamination, s/p ex lap with oversew duodenal ulcer with [name] patch by Dr. Lucia Gaskins.  Dr. Lucia Gaskins ran the small bowel was dilated but there was no obvious ischemic injury post op leak and several fluid collections, peritoneal abscess.  02/26/2019 IR drain placement to LLQ anterior abscess.  Grew C glabrata, C albicans, Enterococcus faecalis, stenotrophomonas; ID following, Zosyn and anidulafungin for close to 1 month.   02/18/2019 CTAP with contrast showed stasis of oral contrast in the mid small bowel which floats in the central abdomen, loops of bowel difficult to discern from each other and pneumatosis noted within several loops of bowel, findings concerning for flaccid bowel/ischemic bowel.  Several fluid pockets in the peritoneal space with thin enhancing rims consistent with peritonitis.  Reduction in the subcapsular fluid  collection along the right hepatic lobe which may represent biloma. 02/25/2019 CTAP with contrast IMPRESSION: 1. There is a persistent fluid collection about the ventral abdomen, which is decreased in size compared to prior examination and partially contrast opacified, suspect persistent leak. This collection measures 14.0 x 3.2 x 14.5 cm, previously 19.6 x 4.2 x 15.2 cm when measured similarly (series 3, image 41, 55, series 7, image 87). 2. Additional fluid collections about the abdomen and pelvis as detailed above, some decreased in size compared to prior examination and some increased, possibly communicating and fluctuant. 3. Surgical drain positioned in the lesser omentum. 4. Diffuse thickening of the small bowel and colon, likely reactive in the setting of peritonitis. 5. Cholelithiasis with gallbladder wall thickening, unchanged compared to prior examination and nonspecific in the setting of ascites. 6. Moderate right, small left pleural effusions. Right pleural effusion is enlarged compared to prior examination, although left pleural effusion is decreased. 7. Anasarca. 8. Fluid in the endometrial cavity, unchanged compared to prior examination although abnormal in the postmenopausal setting. 9.  Aortic Atherosclerosis (ICD10-I70.0).  No evidence post op leak on 02/10/19 or 02/17/19 UGI.     Pt has been receiving TPN.  Patient has persistent abdominal discomfort.    Has had issues with anemia during admission Hgb 10.5 on admission but ~ 9 in mid 12/2018 and in  7 in 05/2018   Has received 3 units PRBCs for recurrent anemia, Nadir to 6.1.   Hgb >> 9 Also thrombocytopenic, nadir 33 on hosp day #4.    Since just before 11 AM this morning she has passed a total of 6 mucoid, bloody stools.  The drainage from the LLQ abscess is also somewhat pink-tinged as is the JP drainage from the surgical bed.  She is coughing up, possibly retching clear, bubbly mucoid material. Her BPs have gone from the  130s-140s/60s-70s to low 100s over 60s.  Heart rate has also accelerated from the 80s to low 90s up to the low 100s.  No fevers.  No previous history of GI bleeding.  Patient denies use of aspirin, NSAIDs etc. prior to admission. Social Hx: was in SNF for a while earlier this year but returned home.  General decline over 2 years duration.     Past Medical History:  Diagnosis Date  . Diabetes mellitus   . Hypertension   . Osteomyelitis of right foot ([place]) 02/22/2017  . Schizophrenia ([place])   . Septic arthritis of interphalangeal joint of toe of left foot ([place]) 06/02/2016  . Stress incontinence 04/26/2017    Past Surgical History:  Procedure Laterality Date  . AMPUTATION Left 06/05/2016   Procedure: LEFT FOOT TRANSMETATARSAL AMPUTATION;  Surgeon: [name], MD;  Location: [facility];  Service: Orthopedics;  Laterality: Left;  . AMPUTATION Right 11/11/2017   Procedure: AMPUTATION BELOW KNEE;  Surgeon: [name], MD;  Location: [place];  Service: Orthopedics;  Laterality: Right;  . AMPUTATION Left 05/10/2018   Procedure: LEFT BELOW KNEE AMPUTATION;  Surgeon: [name], MD;  Location: [place];  Service: Orthopedics;  Laterality: Left;  . AMPUTATION Left 12/28/2018   Procedure: AMPUTATION THIRD DIGIT;  Surgeon: Iran Planas, MD;  Location: [facility];  Service: Orthopedics;  Laterality: Left;  . AMPUTATION TOE Right 02/23/2017   Procedure: AMPUTATION RIGHT THIRD TOE;  Surgeon: [name], MD;  Location: [place];  Service: Orthopedics;  Laterality: Right;  . I & D EXTREMITY Right 11/09/2017   Procedure: Debride Ulcer Right Heel;  Surgeon: [name], MD;  Location: [place];  Service: Orthopedics;  Laterality: Right;  . I & D EXTREMITY Left 12/28/2018   Procedure: IRRIGATION AND DEBRIDEMENT EXTREMITY;  Surgeon: Iran Planas, MD;  Location: [facility];  Service: Orthopedics;  Laterality: Left;  . INCISION AND DRAINAGE OF WOUND Left 12/26/2018   Procedure: IRRIGATION AND DEBRIDEMENT LEFT HAND;   Surgeon: Iran Planas, MD;  Location: [facility];  Service: Orthopedics;  Laterality: Left;  . LAPAROTOMY N/A 02/02/2019   Procedure: EXPLORATORY LAPAROTOMY WITH OVERSEW OF DUODENAL ULCER;  Surgeon: Alphonsa Overall, MD;  Location: [facility];  Service: General;  Laterality: N/A;  . TUBAL LIGATION      Prior to Admission medications   Medication Sig Start Date End Date Taking? Authorizing Provider  acetaminophen (TYLENOL) 325 MG tablet Take 2 tablets (650 mg total) by mouth every 6 (six) hours as needed for mild pain (or Fever >/= 101). 05/21/18  Yes Roney Jaffe, MD  escitalopram (LEXAPRO) 20 MG tablet TAKE ONE TABLET BY MOUTH EVERY DAY Patient taking differently: Take 20 mg by mouth daily.  04/09/18  Yes Charlott Rakes, MD  gabapentin (NEURONTIN) 100 MG capsule Take 2 capsules (200 mg total) by mouth 2 (two) times daily. 11/28/18 02/02/19 Yes Spongberg, Audie Pinto, MD  insulin aspart (NOVOLOG) 100 UNIT/ML injection Inject 0-15 Units into the skin 3 (  three) times daily with meals. 05/21/18  Yes Roney Jaffe, MD  Insulin Glargine (LANTUS) 100 UNIT/ML Solostar Pen Inject 35 Units into the skin daily. 12/30/18  Yes Dhungel, Nishant, MD  Multiple Vitamin (MULTIVITAMIN WITH MINERALS) TABS tablet Take 1 tablet by mouth daily.   Yes [provider]  amLODipine (NORVASC) 5 MG tablet Take 1 tablet (5 mg total) by mouth daily. 11/29/18 12/29/18  Spongberg, Audie Pinto, MD  dextrose 50 % solution Inject 25 mLs (12.5 g total) into the vein as needed for low blood sugar. Patient not taking: Reported on 02/02/2019 05/21/18   Roney Jaffe, MD  doxycycline (VIBRA-TABS) 100 MG tablet Take 1 tablet (100 mg total) by mouth 2 (two) times daily. Patient not taking: Reported on 02/02/2019 12/30/18   Dhungel, Flonnie Overman, MD  polyethylene glycol (MIRALAX) packet Take 17 g by mouth daily as needed for mild constipation. Patient not taking: Reported on 02/02/2019 05/21/18   Roney Jaffe, MD  senna-docusate  (SENOKOT-S) 8.6-50 MG tablet Take 2 tablets by mouth at bedtime as needed for mild constipation. Patient not taking: Reported on 02/02/2019 05/21/18   Roney Jaffe, MD    Scheduled Meds: . chlorhexidine gluconate (MEDLINE KIT)  15 mL Mouth Rinse BID  . Chlorhexidine Gluconate Cloth  6 each Topical Daily  . collagenase   Topical Daily  . Gerhardt's butt cream   Topical TID  . [START ON 03/04/2019] heparin injection (subcutaneous)  5,000 Units Subcutaneous Q8H  . insulin aspart  0-20 Units Subcutaneous Q6H  . metoprolol tartrate  2.5 mg Intravenous Q6H  . pantoprazole (PROTONIX) IV  40 mg Intravenous Q12H  . sodium chloride flush  10-40 mL Intracatheter Q12H  . sodium chloride flush  5 mL Intracatheter Q8H   Infusions: . sodium chloride 10 mL/hr at 02/18/19 1833  . sodium chloride 10 mL/hr at 03/02/19 0200  . ampicillin-sulbactam (UNASYN) IV Stopped (03/03/19 1011)  . anidulafungin Stopped (03/02/19 1840)  . TPN ADULT (ION) 90 mL/hr at 03/03/19 1231  . TPN ADULT (ION)     PRN Meds: acetaminophen, albuterol, dextrose, hydrALAZINE, HYDROcodone-acetaminophen, lip balm, menthol-cetylpyridinium, morphine injection, ondansetron (ZOFRAN) IV, phenol, sodium chloride flush   Allergies as of 02/02/2019 - Review Complete 02/02/2019  Allergen Reaction Noted  . Bee venom Swelling 12/26/2018  . Metformin and related Other (See Comments) 06/18/2013    Family History  Problem Relation Age of Onset  . Diabetes type II Father   . CAD Father   . Prostate cancer Father   . Diabetes Mellitus II Brother     Social History   Socioeconomic History  . Marital status: Single    Spouse name: Not on file  . Number of children: Not on file  . Years of education: Not on file  . Highest education level: Not on file  Occupational History  . Not on file  Tobacco Use  . Smoking status: Never Smoker  . Smokeless tobacco: Never Used  Substance and Sexual Activity  . Alcohol use: No    Comment:  occasionally  . Drug use: No  . Sexual activity: Not Currently  Other Topics Concern  . Not on file  Social History Narrative  . Not on file   Social Determinants of Health   Financial Resource Strain:   . Difficulty of Paying Living Expenses: Not on file  Food Insecurity:   . Worried About Charity fundraiser in the Last Year: Not on file  . Ran Out of Food in the Last Year:  Not on file  Transportation Needs:   . Lack of Transportation (Medical): Not on file  . Lack of Transportation (Non-Medical): Not on file  Physical Activity:   . Days of Exercise per Week: Not on file  . Minutes of Exercise per Session: Not on file  Stress:   . Feeling of Stress : Not on file  Social Connections:   . Frequency of Communication with Friends and Family: Not on file  . Frequency of Social Gatherings with Friends and Family: Not on file  . Attends Religious Services: Not on file  . Active Member of Clubs or Organizations: Not on file  . Attends Archivist Meetings: Not on file  . Marital Status: Not on file  Intimate Partner Violence:   . Fear of Current or Ex-Partner: Not on file  . Emotionally Abused: Not on file  . Physically Abused: Not on file  . Sexually Abused: Not on file    REVIEW OF SYSTEMS: Constitutional: Weakness.  Nonambulatory after bilateral BKA's. ENT:  No nose bleeds Pulm: No shortness of breath.  Does have a productive cough. CV:  No palpitations, no LE edema.  No chest pain GU:  No hematuria, no frequency GI: See HPI. Heme: Bleeding from the rectum as well as from the drains as stated in HPI. Transfusions: See HPI. Neuro:  No headaches, no peripheral tingling or numbness.  No seizures, no syncope. Derm:  No itching, no rash or sores.  Endocrine:  No sweats or chills.  No polyuria or dysuria Immunization: Last documented influenza and pneumococcal vaccination was 05/2018 Travel:  None beyond local counties in last few months.    PHYSICAL EXAM: Vital  signs in last 24 hours: Vitals:   03/03/19 1100 03/03/19 1200  BP:  107/60  Pulse: (!) 108 89  Resp: (!) 25 (!) 23  Temp:    SpO2:  100%   Wt Readings from Last 3 Encounters:  03/03/19 53.3 kg  12/26/18 57.9 kg  12/17/18 54.8 kg    General: Thin, cachectic, chronically ill, alert AAF. Head: No facial asymmetry or swelling.  No signs of head trauma.  Temporal wasting. Eyes: No scleral icterus.  No conjunctival pallor.  EOMI. Ears: Not hard of hearing. Nose: No discharge or congestion. Mouth: Oral mucosa pink, moist, clear.  No lesions, no bleeding.  Poor dentition, the bulk of her teeth are absent. Neck: No JVD, no masses, no thyromegaly. Lungs: Clear to auscultation in front.  No labored breathing.  Coughing up clear, frothy mucus. Heart: Slightly tacky but regular.  No MRG.  S1, S2 present Abdomen: Soft.  Firm over incisional region.  There is a drain on the right abdomen which is a JP drain which is hooked up to suction.  This drainage is a combination of bloody and bilious looking but more brown in appearance.  The other drain, the one connecting to the LLQ abscess is also somewhat pink-tinged brown in color..   Rectal: Deferred Musc/Skeltl: Muscle wasting.  Bilateral BKA's. Extremities: No edema Neurologic: Alert.  Follows commands.  Does not speak a lot but nods appropriately yes no to answer questions.  No tremor.  Moves all 4 limbs but strength not tested. Skin: Did not do complete dermatologic survey but I do not see any lesions on the arms or legs. Nodes: No cervical adenopathy Psych: Cooperative.  Intake/Output from previous day: 12/20 0701 - 12/21 0700 In: 2355.7 [I.V.:1925.6; IV Piggyback:430.1] Out: 2525 [Urine:1300; Emesis/NG output:450; Drains:775] Intake/Output this shift: Total  I/O In: 907.5 [I.V.:811.6; IV Piggyback:95.9] Out: -   LAB RESULTS: Recent Labs    03/03/19 0201 03/03/19 1300  WBC 9.2 16.1*  HGB 8.8* 9.0*  HCT 29.5* 30.1*  PLT 655* 598*    BMET Lab Results  Component Value Date   NA 143 03/03/2019   NA 139 02/27/2019   NA 139 02/25/2019   K 4.4 03/03/2019   K 4.8 02/27/2019   K 4.6 02/25/2019   CL 111 03/03/2019   CL 106 02/27/2019   CL 108 02/25/2019   CO2 25 03/03/2019   CO2 27 02/27/2019   CO2 25 02/25/2019   GLUCOSE 133 (H) 03/03/2019   GLUCOSE 150 (H) 02/27/2019   GLUCOSE 137 (H) 02/25/2019   BUN 44 (H) 03/03/2019   BUN 42 (H) 02/27/2019   BUN 44 (H) 02/25/2019   CREATININE 0.54 03/03/2019   CREATININE 0.66 02/27/2019   CREATININE 0.67 02/25/2019   CALCIUM 9.2 03/03/2019   CALCIUM 8.9 02/27/2019   CALCIUM 8.7 (L) 02/25/2019   LFT Recent Labs    03/03/19 0201  PROT 6.9  ALBUMIN 1.5*  AST 35  ALT 33  ALKPHOS 369*  BILITOT 0.6   PT/INR Lab Results  Component Value Date   INR 1.2 02/02/2019   INR 1.3 (H) 02/02/2019   INR 1.02 02/22/2017   Hepatitis Panel No results for input(s): HEPBSAG, HCVAB, HEPAIGM, HEPBIGM in the last 72 hours. C-Diff No components found for: CDIFF Lipase     Component Value Date/Time   LIPASE 27 04/01/2016 1040    Drugs of Abuse     Component Value Date/Time   LABOPIA NONE DETECTED 02/02/2019 1803   COCAINSCRNUR NONE DETECTED 02/02/2019 1803   LABBENZ NONE DETECTED 02/02/2019 1803   AMPHETMU NONE DETECTED 02/02/2019 1803   THCU NONE DETECTED 02/02/2019 1803   LABBARB NONE DETECTED 02/02/2019 1803     RADIOLOGY STUDIES: No results found.    IMPRESSION:   *    GI bleed in pt who underwent oversew of perforated duodenal ulcer associated with intra-abdominal contamination.  Has subsequently required ongoing drainage of the surgical bed and another drain was placed on the 16th to address 1 of several fluid collections, abscesses.  Prolonged antibiotics and antifungal medications in place. H pylori IgG elevated at 2.1. R/o ischemic colitis or SB.  R/o bleeding from the duodenal ulcer.    *    Hepatitis C?  HCV Ab > 11. "Strongly reactive Ab screen is  c/w past or present HCV infection".  No liver disease on CTs of 11/22, 12/8, 12/15.  This is new finding for this admission.    *   Blood loss anemia and hx of anemia of chronic dz, IDA, menorrhagia associated blood loss.  MCV normal.  Low iron, low TIBC, borderline low/normal iron sats, ferritin 300, b12 and folate at or above normal.    *   Protein cal malnutrition, acute on chronic.  TPN in place since exlap.    *    Thrombocytopenia at admission.  Platelets nadir of 31 K, now corrected and platelets above normal at 598  *   Poorly controlled IDDM. Long term issue with hx DKA.  A1c 15.2 at admission.    *   S/p bil BKA.     PLAN:     *   Dr. Lyndel Safe prefers to avoid endoscopy.  Ordering CTAP with angiography  *    Continue current Protonix 40 mg IV bid.    *  HCV quant in AM.  Check stool for H Pylori but wait til bleeding resolves to do this.    Azucena Freed  03/03/2019, 1:14 PM Phone [phone]   Attending physician's note   I have taken an interval history, reviewed the chart and examined the patient. I agree with the Advanced Practitioner's note, impression and recommendations.   GI Bleed. Hb stable at around 9 Perforated DU s/p [name] patch 11/23 with leak. JP drain with some bloody aspirate. Pos HP antibody. Intra-abdominal abscess s/p LLQ drain placement 12/16 (enterococcus, candida glabrata, stenotrophomonas maltophilia). ID following. On Usasyn/Eraxis. Bactrim added. Chronic Hep C. No cirrhosis Co morbid conditions include DM2, PAD s/p B/L BKA, PCM on TNA, HTN  Plan: -IV protonix -Trend CBC. Keep Hb>7 -CTA (also some ? Ischemic bowel) -Continue TNA for now. May benefit from J tube. -EGD only if continued bleeding. May cause leak to worsen.   Carmell Austria, MD Velora Heckler Fabienne Bruns [phone].

## 2019-03-03 NOTE — Progress Notes (Signed)
Patient has had 4 bloody mucous bowel movements within an hour. Tech reports it is a small amount. Saverio Danker, PA, with [facility] has been paged.

## 2019-03-03 NOTE — Progress Notes (Signed)
    [facility]   Reason for visit: Follow up on perforated gastric ulcer and C glabrata   Interval History:  WBC increased to 16, remains afebrile.  Bloody mucus noted today.  Hgb stable.   Day 30 total antibiotics Day 2 amp/sulbactam Day 28 anidulafungin  Physical Exam: Constitutional:  Vitals:   03/03/19 1337 03/03/19 1400  BP: 108/63 125/65  Pulse: (!) 105 (!) 112  Resp: (!) 30 (!) 24  Temp:    SpO2: 100%    patient appears in NAD, sleeping but arousable; chronically ill appearing Eyes: anicteric HENT: +NGT Respiratory: Normal respiratory effort; CTA B Cardiovascular: RRR  Review of Systems: Constitutional: negative for chills, no fever Denies any pain Abd: no nausea  Lab Results  Component Value Date   WBC 16.1 (H) 03/03/2019   HGB 9.0 (L) 03/03/2019   HCT 30.1 (L) 03/03/2019   MCV 97.7 03/03/2019   PLT 598 (H) 03/03/2019    Lab Results  Component Value Date   CREATININE 0.54 03/03/2019   BUN 44 (H) 03/03/2019   NA 143 03/03/2019   K 4.4 03/03/2019   CL 111 03/03/2019   CO2 25 03/03/2019    Lab Results  Component Value Date   ALT 33 03/03/2019   AST 35 03/03/2019   ALKPHOS 369 (H) 03/03/2019     Microbiology: Recent Results (from the past 240 hour(s))  Aerobic/Anaerobic Culture (surgical/deep wound)     Status: None   Collection Time: 02/26/19  3:31 PM   Specimen: Abscess  Result Value Ref Range Status   Specimen Description   Final    ABSCESS LLQ CT DRAIN CATHETER Performed at [facility], [place] [address] [place], [place] [address]    Special Requests   Final    Normal Performed at [facility], [place] [address], [place], [place] [address]    Gram Stain   Final    MODERATE WBC PRESENT,BOTH PMN AND MONONUCLEAR FEW YEAST    Culture   Final    FEW ENTEROCOCCUS FAECALIS FEW STENOTROPHOMONAS MALTOPHILIA MODERATE CANDIDA GLABRATA NO ANAEROBES ISOLATED Performed at [facility], [place] [address] [place], [place] [address]    Report Status 03/03/2019 FINAL  Final   Organism ID, Bacteria ENTEROCOCCUS FAECALIS  Final   Organism ID, Bacteria STENOTROPHOMONAS MALTOPHILIA  Final      Susceptibility   Enterococcus faecalis - MIC*    AMPICILLIN <=2 SENSITIVE Sensitive     VANCOMYCIN >=32 RESISTANT Resistant     GENTAMICIN SYNERGY SENSITIVE Sensitive     LINEZOLID 2 SENSITIVE Sensitive     * FEW ENTEROCOCCUS FAECALIS   Stenotrophomonas maltophilia - MIC*    LEVOFLOXACIN >=8 RESISTANT Resistant     TRIMETH/SULFA <=20 SENSITIVE Sensitive     * FEW STENOTROPHOMONAS MALTOPHILIA    Impression/Plan:  1. Intra abdominal infection - abscess and drain placed on 12/16.  Has grown E faecalis and Stenotrophomonas and sensitivities noted.   I will add in Bactrim.    2. Medication monitoring - will monitor creat.    3.  Bloody mucus - Hgb stable, being seen by GI.  On IV ppi.

## 2019-03-04 ENCOUNTER — Inpatient Hospital Stay (HOSPITAL_COMMUNITY): Payer: Medicare HMO

## 2019-03-04 DIAGNOSIS — B1920 Unspecified viral hepatitis C without hepatic coma: Secondary | ICD-10-CM

## 2019-03-04 DIAGNOSIS — J90 Pleural effusion, not elsewhere classified: Secondary | ICD-10-CM

## 2019-03-04 DIAGNOSIS — I509 Heart failure, unspecified: Secondary | ICD-10-CM

## 2019-03-04 LAB — CBC
HCT: 25.8 % — ABNORMAL LOW (ref 36.0–46.0)
Hemoglobin: 7.6 g/dL — ABNORMAL LOW (ref 12.0–15.0)
MCH: 29.1 pg (ref 26.0–34.0)
MCHC: 29.5 g/dL — ABNORMAL LOW (ref 30.0–36.0)
MCV: 98.9 fL (ref 80.0–100.0)
Platelets: 557 10*3/uL — ABNORMAL HIGH (ref 150–400)
RBC: 2.61 MIL/uL — ABNORMAL LOW (ref 3.87–5.11)
RDW: 15.8 % — ABNORMAL HIGH (ref 11.5–15.5)
WBC: 14.5 10*3/uL — ABNORMAL HIGH (ref 4.0–10.5)
nRBC: 0 % (ref 0.0–0.2)

## 2019-03-04 LAB — LACTATE DEHYDROGENASE, PLEURAL OR PERITONEAL FLUID: LD, Fluid: 82 U/L — ABNORMAL HIGH (ref 3–23)

## 2019-03-04 LAB — BODY FLUID CELL COUNT WITH DIFFERENTIAL
Eos, Fluid: 1 %
Lymphs, Fluid: 14 %
Monocyte-Macrophage-Serous Fluid: 72 % (ref 50–90)
Neutrophil Count, Fluid: 13 % (ref 0–25)
Total Nucleated Cell Count, Fluid: 617 cu mm (ref 0–1000)

## 2019-03-04 LAB — GLUCOSE, CAPILLARY
Glucose-Capillary: 144 mg/dL — ABNORMAL HIGH (ref 70–99)
Glucose-Capillary: 151 mg/dL — ABNORMAL HIGH (ref 70–99)
Glucose-Capillary: 182 mg/dL — ABNORMAL HIGH (ref 70–99)
Glucose-Capillary: 210 mg/dL — ABNORMAL HIGH (ref 70–99)

## 2019-03-04 LAB — PROTEIN, PLEURAL OR PERITONEAL FLUID: Total protein, fluid: 3.8 g/dL

## 2019-03-04 LAB — ECHOCARDIOGRAM COMPLETE
Height: 62 in
Weight: 1880.08 oz

## 2019-03-04 MED ORDER — LIDOCAINE HCL 1 % IJ SOLN
INTRAMUSCULAR | Status: AC
  Filled 2019-03-04: qty 20

## 2019-03-04 MED ORDER — TRAVASOL 10 % IV SOLN
INTRAVENOUS | Status: AC
  Filled 2019-03-04: qty 1296

## 2019-03-04 NOTE — Procedures (Signed)
Ultrasound-guided diagnostic and therapeutic right thoracentesis performed yielding 710 cc of yellow fluid. No immediate complications. Follow-up chest x-ray pending.  The fluid was submitted to the lab for preordered studies. EBL none.
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
## 2019-03-04 NOTE — Progress Notes (Signed)
  Echocardiogram 2D Echocardiogram has been performed.  Gina [name] 03/04/2019, 2:50 PM

## 2019-03-04 NOTE — Progress Notes (Signed)
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  She was found to have perforated gastric ulcer with pneumoperitoneum, also had evidence DKA.  She was intubated and admitted to ICU.  Started on broad-spectrum antibiotics, pressors, IV fluids.  She underwent expiratory laparotomy 11/23 with finding of perforated prepyloric ulcer which was repaired, contaminated peritoneal fluid was drained.  She was extubated on 11/29.  She had extensive complicated hospital course with DKA, AKI, acute blood loss anemia, thrombocytopenia, intra-abdominal infection, bacteremia, fungemia.    General surgery closely following.  Currently on IV antibiotics.  Abdominal CT done on 12/15 showed persistent abdominal collection,leak.  IR placed left lower quadrant anterior peritoneal drain catheter on 02/26/19.Restarted on eraxis after aerobic/anaerobic culture showed yeast. On 03/03/19, she had multiple bloody bowel movements including bloody output from the JP drain.  GI consulted.  CT angiogram of the abdomen did not show any active bleeding but showed persistent large complex anterior peritoneal fluid collection.  Assessment & Plan:   Principal Problem:   Septic shock ([place]) Active Problems:   DKA (diabetic ketoacidoses) ([place])   Severe protein-calorie malnutrition ([place])   Acute kidney injury ([place])   Pressure injury of skin   Perforated gastric ulcer ([place])   Acute respiratory failure ([place])   Duodenal perforation ([facility])   Below-knee amputee ([place])   Atherosclerosis of native arteries of extremities with gangrene, right leg ([facility])   Atherosclerosis of native arteries of  extremities with gangrene, left leg ([facility])   Septic shock due to ruptured gastric ulcer with acute peritonitis, fungemia, UTI: S/pex lap with oversew of duodenal ulcer,with Graham patch 11/23,DrLucia Gaskins, for perforated prepyloric ulcer with gross contamination.General surgery following. ID was consulted and following. Blood cultures had shown Candida glabrata and she completed the course for fungemia.  Urine culture showed Klebsiella pneumoniae.  Peritoneal fluid culture showed Candida albicans. No evidence of fungal endophthalmitis after ophthalmology consultation.. CT abdomen done on 12/15 and 12/21 showed persistent large  fluid collection on the anterior abdomen,possible leak.IR placed left lower quadrant anterior peritoneal drain catheter on 12/16.[name] Aerobic/anaerobic culture now again showed candida glabrata Enterococcus faecalis, stenotrophomonas.  Restarted on Eraxis.Currently on unasyn and bactrim .She has a RLQ JP drain with suction and midline surgical wound.Has NG tube  Concern for GI bleed: On 12/21 she had multiple bloody bowel movements and there was output of bloody content in the JP drain.  GI consulted.  CT angio did not show any active bleeding.  H&H currently stable.  Moderate right pleural effusion: Atelectasis of the right lower lobe ith  decreased air entry on the right side.  We have ordered for thoracentesis.  Ischemic changes at bilateral BKA site: Possibly secondary to severe septic shock.  She was seen by vascular surgery, orthopedics.  She will eventually need above-knee amputation when she is more stable.  Patient declined surgery at this time so continue current medical treatment  Acute kidney injury: Back to baseline now  ICU delirium: Secondary to infectious etiology, hospital admission, ICU environment.  Mental status has improved and she is currently alert and oriented.  DKA with severely uncontrolled hyperglycemia: Resolved.  Hemoglobin A1c of 15.2 as per  11/23.  Continue current insulin  regimen  Severe protein calorie malnutrition with hypoalbuminemia: Started on TPN.   Dietitian following  Anemia due to acute blood loss, critical illness:  Total of 3 units so far this hospitalization.  Continue to monitor CBC  Acute thrombocytopenia: Secondary to septic shock.  Resolved  Status post bilateral BKA, left phalanx amputation: Hand surgery and orthopedics were following.  Hep C: Hep C RNA pending  Debility/deconditioning/poor prognosis: Extremely sick patient.  Completed hospital course.  Palliative  care was consulted for goals of care discussion.  Had meeting with family on 12/9.  Family wanted more time to consider further goals of care discussion. I have discussed with daughter again today.  I have expressed my views.  She is interested talk to palliative care regarding goals of care, she is leaning towards palliative approach.   Pressure Injury 12/26/18 Buttocks Left Stage II -  Partial thickness loss of dermis presenting as a shallow open ulcer with a red, pink wound bed without slough. (Active)  12/26/18 0630  Location: Buttocks  Location Orientation: Left  Staging: Stage II -  Partial thickness loss of dermis presenting as a shallow open ulcer with a red, pink wound bed without slough.  Wound Description (Comments):   Present on Admission: Yes     Pressure Injury 12/26/18 Buttocks Right;Lower Stage II -  Partial thickness loss of dermis presenting as a shallow open ulcer with a red, pink wound bed without slough. (Active)  12/26/18 0630  Location: Buttocks  Location Orientation: Right;Lower  Staging: Stage II -  Partial thickness loss of dermis presenting as a shallow open ulcer with a red, pink wound bed without slough.  Wound Description (Comments):   Present on Admission: Yes     Pressure Injury 02/03/19 Coccyx Right;Left;Medial Unstageable - Full thickness tissue loss in which the base of the ulcer is covered by slough  (yellow, tan, gray, green or brown) and/or eschar (tan, brown or black) in the wound bed. pt has 3 small injuri (Active)  02/03/19 0230  Location: Coccyx  Location Orientation: Right;Left;Medial  Staging: Unstageable - Full thickness tissue loss in which the base of the ulcer is covered by slough (yellow, tan, gray, green or brown) and/or eschar (tan, brown or black) in the wound bed.  Wound Description (Comments): pt has 3 small injuries all in the same area, one is medial and two are on either side of it  Present on Admission: Yes           Nutrition Problem: Inadequate oral intake Etiology: altered GI function(pyloric channel ulcer perforation s/p ex lap)      DVT prophylaxis:Heparin [place] Code Status: Full Family Communication: Discussed with daughter on phone on 03/04/19 Disposition Plan: LTACH ?  Anticipate prolonged hospitalization.  Not a stable for discharge.  Consultants: Neurosurgery, PCCM, ID  Procedures: Exploratory laparotomy  Antimicrobials:  Anti-infectives (From admission, onward)   Start     Dose/Rate Route Frequency Ordered Stop   03/03/19 1600  sulfamethoxazole-trimethoprim (BACTRIM) 200 mg of trimethoprim in dextrose 5 % 250 mL IVPB     200 mg of trimethoprim 262.5 mL/hr over 60 Minutes Intravenous Every 6 hours 03/03/19 1457     03/01/19 1800  Ampicillin-Sulbactam (UNASYN) 3 g in sodium chloride 0.9 % 100 mL IVPB     3 g 200 mL/hr over 30 Minutes Intravenous Every 8 hours 03/01/19 1304     02/28/19 1600  anidulafungin (ERAXIS) 100 mg in sodium chloride 0.9 % 100 mL IVPB  100 mg 78 mL/hr over 100 Minutes Intravenous Every 24 hours 02/27/19 1346     02/27/19 1600  anidulafungin (ERAXIS) 200 mg in sodium chloride 0.9 % 200 mL IVPB     200 mg 78 mL/hr over 200 Minutes Intravenous  Once 02/27/19 1346 02/27/19 1840   02/18/19 1800  piperacillin-tazobactam (ZOSYN) IVPB 3.375 g  Status:  Discontinued     3.375 g 12.5 mL/hr over 240 Minutes Intravenous  Every 8 hours 02/18/19 1652 03/01/19 1259   02/05/19 1000  anidulafungin (ERAXIS) 100 mg in sodium chloride 0.9 % 100 mL IVPB  Status:  Discontinued     100 mg 78 mL/hr over 100 Minutes Intravenous Every 24 hours 02/04/19 0840 02/25/19 1604   02/04/19 1000  anidulafungin (ERAXIS) 200 mg in sodium chloride 0.9 % 200 mL IVPB     200 mg 78 mL/hr over 200 Minutes Intravenous  Once 02/04/19 0840 02/04/19 1301   02/04/19 0200  fluconazole (DIFLUCAN) IVPB 200 mg  Status:  Discontinued     200 mg 100 mL/hr over 60 Minutes Intravenous Every 24 hours 02/02/19 2302 02/04/19 0840   02/03/19 1800  vancomycin (VANCOCIN) IVPB 750 mg/150 ml premix  Status:  Discontinued     750 mg 150 mL/hr over 60 Minutes Intravenous Every 24 hours 02/03/19 0241 02/03/19 0242   02/03/19 1800  vancomycin (VANCOCIN) IVPB 750 mg/150 ml premix  Status:  Discontinued     750 mg 150 mL/hr over 60 Minutes Intravenous Every 24 hours 02/03/19 0242 02/03/19 0858   02/03/19 0600  piperacillin-tazobactam (ZOSYN) IVPB 3.375 g     3.375 g 12.5 mL/hr over 240 Minutes Intravenous Every 8 hours 02/03/19 0243 02/17/19 0205   02/02/19 2315  fluconazole (DIFLUCAN) IVPB 400 mg     400 mg 100 mL/hr over 120 Minutes Intravenous STAT 02/02/19 2301 02/03/19 0218   02/02/19 1800  ceFEPIme (MAXIPIME) 2 g in sodium chloride 0.9 % 100 mL IVPB     2 g 200 mL/hr over 30 Minutes Intravenous  Once 02/02/19 1745 02/02/19 2030   02/02/19 1800  metroNIDAZOLE (FLAGYL) IVPB 500 mg     500 mg 100 mL/hr over 60 Minutes Intravenous  Once 02/02/19 1745 02/02/19 2237   02/02/19 1800  vancomycin (VANCOCIN) IVPB 1000 mg/200 mL premix  Status:  Discontinued     1,000 mg 200 mL/hr over 60 Minutes Intravenous  Once 02/02/19 1745 02/02/19 1747   02/02/19 1800  vancomycin (VANCOCIN) 1,250 mg in sodium chloride 0.9 % 250 mL IVPB     1,250 mg 166.7 mL/hr over 90 Minutes Intravenous  Once 02/02/19 1747 02/02/19 2237      Subjective: Patient seen and examined the  bedside this morning.  Hemodynamically stable.  Not having active bloody bowel movement at present but her JP drain is draining dark fluid.  Complains of abdominal discomfort but not in distress.  Objective: Vitals:   03/04/19 0000 03/04/19 0400 03/04/19 0800 03/04/19 1000  BP: (!) 118/45 (!) 107/45 (!) 115/59 (!) 119/57  Pulse: 100   89  Resp: _0 Temp: 98.1 F (36.7 C) 98.4 F (36.9 C) (!) 97.2 F (36.2 C)   TempSrc: Oral Oral Oral   SpO2: 100% 95% 100%   Weight:      Height:        Intake/Output Summary (Last 24 hours) at 03/04/2019 1042 Last data filed at 03/04/2019 0500 Gross per 24 hour  Intake 3050.43 ml  Output 1430 ml  Net  1620.43 ml   Filed Weights   03/01/19 0500 03/02/19 0453 03/03/19 0500  Weight: 51.7 kg 51.7 kg 53.3 kg    Examination:  General exam: Extremely Deconditioned, debilitated, weak HEENT:PERRL, Ear/Nose normal on gross exam,NG tube Respiratory system: Decreased air entry on the right side Cardiovascular system: S1 & S2 heard, RRR. No JVD, murmurs, rubs, gallops or clicks.  Gastrointestinal system: Abdomen is mildly distended, soft ,mild generalized tenderness. Bowel sounds not heard . Midline abdominal surgical wound, right lower quadrant and left lower quadrant drains  Central nervous system: Alert and awake  extremities: Bilateral BKA.  Amputation site covered with dressing, amputation of left middle finger Skin: No rashes, lesions no icterus ,no pallor.  Unstageable sacral wound   Data Reviewed: I have personally reviewed following labs and imaging studies  CBC: Recent Labs  Lab 02/26/19 0500 02/26/19 1142 02/28/19 0148 03/03/19 0201 03/03/19 1300 03/04/19 0210  WBC 11.7*  --  9.4 9.2 16.1* 14.5*  NEUTROABS  --   --  5.9 5.9  --   --   HGB 6.8* 8.7* 9.5* 8.8* 9.0* 7.6*  HCT 22.7* 28.1* 31.0* 29.5* 30.1* 25.8*  MCV 96.2  --  95.1 95.8 97.7 98.9  PLT 628*  --  651* 655* 598* 315*   Basic Metabolic Panel: Recent Labs  Lab  02/27/19 0548 03/03/19 0201  NA 139 143  K 4.8 4.4  CL 106 111  CO2 27 25  GLUCOSE 150* 133*  BUN 42* 44*  CREATININE 0.66 0.54  CALCIUM 8.9 9.2  MG 2.0 2.2  PHOS 3.5 3.5   GFR: Estimated Creatinine Clearance: 61.4 mL/min (by C-G formula based on SCr of 0.54 mg/dL). Liver Function Tests: Recent Labs  Lab 02/27/19 0548 03/03/19 0201  AST 25 35  ALT 23 33  ALKPHOS 275* 369*  BILITOT 0.3 0.6  PROT 6.7 6.9  ALBUMIN 1.2* 1.5*   No results for input(s): LIPASE, AMYLASE in the last 168 hours. No results for input(s): AMMONIA in the last 168 hours. Coagulation Profile: No results for input(s): INR, PROTIME in the last 168 hours. Cardiac Enzymes: No results for input(s): CKTOTAL, CKMB, CKMBINDEX, TROPONINI in the last 168 hours. BNP (last 3 results) No results for input(s): PROBNP in the last 8760 hours. HbA1C: No results for input(s): HGBA1C in the last 72 hours. CBG: Recent Labs  Lab 03/03/19 0612 03/03/19 1130 03/03/19 1804 03/03/19 2334 03/04/19 0531  GLUCAP 143* 124* 190* 250* 182*   Lipid Profile: Recent Labs    03/03/19 0201  TRIG 153*   Thyroid Function Tests: No results for input(s): TSH, T4TOTAL, FREET4, T3FREE, THYROIDAB in the last 72 hours. Anemia Panel: No results for input(s): VITAMINB12, FOLATE, FERRITIN, TIBC, IRON, RETICCTPCT in the last 72 hours. Sepsis Labs: No results for input(s): PROCALCITON, LATICACIDVEN in the last 168 hours.  Recent Results (from the past 240 hour(s))  Aerobic/Anaerobic Culture (surgical/deep wound)     Status: None   Collection Time: 02/26/19  3:31 PM   Specimen: Abscess  Result Value Ref Range Status   Specimen Description   Final    ABSCESS LLQ CT DRAIN CATHETER Performed at [facility], [address] [place], [place] [address]    Special Requests   Final    Normal Performed at [facility], [place] [address] [place], [place] [address]    Gram Stain   Final     MODERATE WBC PRESENT,BOTH PMN AND MONONUCLEAR FEW YEAST    Culture  Final    FEW ENTEROCOCCUS FAECALIS FEW STENOTROPHOMONAS MALTOPHILIA MODERATE CANDIDA GLABRATA NO ANAEROBES ISOLATED Performed at [facility], [place] [address], [place], [place] [address]    Report Status 03/03/2019 FINAL  Final   Organism ID, Bacteria ENTEROCOCCUS FAECALIS  Final   Organism ID, Bacteria STENOTROPHOMONAS MALTOPHILIA  Final      Susceptibility   Enterococcus faecalis - MIC*    AMPICILLIN <=2 SENSITIVE Sensitive     VANCOMYCIN >=32 RESISTANT Resistant     GENTAMICIN SYNERGY SENSITIVE Sensitive     LINEZOLID 2 SENSITIVE Sensitive     * FEW ENTEROCOCCUS FAECALIS   Stenotrophomonas maltophilia - MIC*    LEVOFLOXACIN >=8 RESISTANT Resistant     TRIMETH/SULFA <=20 SENSITIVE Sensitive     * FEW STENOTROPHOMONAS MALTOPHILIA         Radiology Studies: CT Angio Abd/Pel w/ and/or w/o  Result Date: 03/03/2019 CLINICAL DATA:  Duodenal ulcer, post surgical repair with postop anterior peritoneal fluid collection, post pigtail drain catheter placement 02/26/2019. New hypotension and tachycardia. EXAM: CTA ABDOMEN AND PELVIS WITHOUT AND WITH CONTRAST TECHNIQUE: Multidetector CT imaging of the abdomen and pelvis was performed using the standard protocol during bolus administration of intravenous contrast. Multiplanar reconstructed images and MIPs were obtained and reviewed to evaluate the vascular anatomy. CONTRAST:  125m OMNIPAQUE IOHEXOL 350 MG/ML SOLN COMPARISON:  02/25/2019 FINDINGS: VASCULAR Aorta: Mild scattered calcified plaque. No aneurysm, dissection, or stenosis. Celiac: Patent without evidence of aneurysm, dissection, vasculitis or significant stenosis. Left hepatic artery arises from left gastric artery, an anatomic variant. SMA: Patent without evidence of aneurysm, dissection, vasculitis or significant stenosis. Renals: Single left, widely patent. Duplicated right, superior dominant, both patent.  IMA: Patent without evidence of aneurysm, dissection, vasculitis or significant stenosis. Inflow: Patent without evidence of aneurysm, dissection, vasculitis or significant stenosis. Proximal Outflow: Bilateral common femoral and visualized portions of the superficial and profunda femoral arteries are patent without evidence of aneurysm, dissection, vasculitis or significant stenosis. Veins: Iliac venous system unremarkable. IVC is nondistended. Patent bilateral renal veins. Patent SMV, splenic vein, portal vein. Hepatic veins patent. Review of the MIP images confirms the above findings. NON-VASCULAR Lower chest: Some increase in moderate right pleural effusion. Extensive consolidation/atelectasis in the visualized right lower lobe. Patchy subsegmental atelectasis/consolidation/scarring posteriorly at the left lung base. Hepatobiliary: 1.4 cm partially calcified stone in the nondilated gallbladder. No focal liver lesion or biliary ductal dilatation. Pancreas: Unremarkable. No pancreatic ductal dilatation or surrounding inflammatory changes. Spleen: Normal in size without focal abnormality. Adrenals/Urinary Tract: Adrenal glands unremarkable. Normal bilateral renal enhancement. No hydronephrosis. Urinary bladder is distended. Stomach/Bowel: No evidence of active extravasation. Nasogastric tube extends into decompressed stomach. There is fluid distention of the proximal duodenum. Multiple fluid distended small bowel loops. There is a small amount of fluid in the cecum and proximal ascending colon, the remainder of the colon virtually completely decompressed. Lymphatic: Left para-aortic nodes up to 0.9 cm short axis diameter. Prominent 8 mm distal right external iliac node. No definite mesenteric adenopathy. Reproductive: Uterus and bilateral adnexa are unremarkable. Other: Little overall change in simple appearing fluid collection lateral and inferior to the greater curvature of the stomach. Right infrahepatic  surgical drain without significant adjacent fluid. Complex loculated peripherally enhancing collection in the anterior peritoneal cavity has increased in size now measuring 15.8 x 3.9 cm maximum transverse dimensions, previously 11.7 x 1.7 cm by my measurement. This contains mixed attenuation material and some scattered gas bubbles. Percutaneous drain catheter appears adequately positioned  at the inferior margin of this collection. Small loculated fluid collection posterior to the uterus is stable. No definite new fluid collections. No free air. Musculoskeletal: No fracture or worrisome bone lesion. Bilateral sacroiliitis. IMPRESSION: 1. No active GI bleed or other acute vascular findings. 2. Interval increase in size of large complex anterior peritoneal fluid collection, despite percutaneous drain catheter at the inferior margin. 3. Persistent fluid distended small bowel loops, possibly secondary to ileus. 4. Increasing moderate right pleural effusion with extensive right lower lobe consolidation/atelectasis. 5. Cholelithiasis. 6. Bilateral sacroiliitis. Electronically Signed   By: Lucrezia Europe M.D.   On: 03/03/2019 17:00        Scheduled Meds: . chlorhexidine gluconate (MEDLINE KIT)  15 mL Mouth Rinse BID  . Chlorhexidine Gluconate Cloth  6 each Topical Daily  . collagenase   Topical Daily  . Gerhardt's butt cream   Topical TID  . heparin injection (subcutaneous)  5,000 Units Subcutaneous Q8H  . insulin aspart  0-20 Units Subcutaneous Q6H  . metoprolol tartrate  2.5 mg Intravenous Q6H  . pantoprazole (PROTONIX) IV  40 mg Intravenous Q12H  . sodium chloride flush  10-40 mL Intracatheter Q12H  . sodium chloride flush  5 mL Intracatheter Q8H   Continuous Infusions: . sodium chloride 10 mL/hr at 03/04/19 0558  . sodium chloride 10 mL/hr at 03/04/19 0202  . ampicillin-sulbactam (UNASYN) IV 3 g (03/04/19 1012)  . anidulafungin Stopped (03/03/19 1657)  . sulfamethoxazole-trimethoprim Stopped  (03/04/19 0510)  . TPN ADULT (ION) 90 mL/hr at 03/04/19 0500  . TPN ADULT (ION)       LOS: 30 days    Time spent: 35 mins.More than 50% of that time was spent in counseling and/or coordination of care.      Shelly Coss, MD [facility] Pager [phone]  If 7PM-7AM, please contact night-coverage [email or web address] Password [facility] 03/04/2019, 10:42 AM

## 2019-03-04 NOTE — Progress Notes (Signed)
Patient ID: Gina [name], female   DOB: Jan 25, 1961, 58 y.o.   MRN: [id number]    Referring Physician(s): [place]  Supervising Physician: Jacqulynn Cadet  Patient Status:  [facility] - In-pt  Chief Complaint:  Abdominal fluid collection  Subjective: Pt s/p rt thoracentesis earlier today with no ptx afterwards; some persistent abd discomfort   Allergies: Bee venom and Metformin and related  Medications: Prior to Admission medications   Medication Sig Start Date End Date Taking? Authorizing Provider  acetaminophen (TYLENOL) 325 MG tablet Take 2 tablets (650 mg total) by mouth every 6 (six) hours as needed for mild pain (or Fever >/= 101). 05/21/18  Yes Roney Jaffe, MD  escitalopram (LEXAPRO) 20 MG tablet TAKE ONE TABLET BY MOUTH EVERY DAY Patient taking differently: Take 20 mg by mouth daily.  04/09/18  Yes Charlott Rakes, MD  gabapentin (NEURONTIN) 100 MG capsule Take 2 capsules (200 mg total) by mouth 2 (two) times daily. 11/28/18 02/02/19 Yes Spongberg, Audie Pinto, MD  insulin aspart (NOVOLOG) 100 UNIT/ML injection Inject 0-15 Units into the skin 3 (three) times daily with meals. 05/21/18  Yes Roney Jaffe, MD  Insulin Glargine (LANTUS) 100 UNIT/ML Solostar Pen Inject 35 Units into the skin daily. 12/30/18  Yes Dhungel, Nishant, MD  Multiple Vitamin (MULTIVITAMIN WITH MINERALS) TABS tablet Take 1 tablet by mouth daily.   Yes [provider]  amLODipine (NORVASC) 5 MG tablet Take 1 tablet (5 mg total) by mouth daily. 11/29/18 12/29/18  Spongberg, Audie Pinto, MD  dextrose 50 % solution Inject 25 mLs (12.5 g total) into the vein as needed for low blood sugar. Patient not taking: Reported on 02/02/2019 05/21/18   Roney Jaffe, MD  doxycycline (VIBRA-TABS) 100 MG tablet Take 1 tablet (100 mg total) by mouth 2 (two) times daily. Patient not taking: Reported on 02/02/2019 12/30/18   Dhungel, Flonnie Overman, MD  polyethylene glycol (MIRALAX) packet Take 17 g by mouth daily as  needed for mild constipation. Patient not taking: Reported on 02/02/2019 05/21/18   Roney Jaffe, MD  senna-docusate (SENOKOT-S) 8.6-50 MG tablet Take 2 tablets by mouth at bedtime as needed for mild constipation. Patient not taking: Reported on 02/02/2019 05/21/18   Roney Jaffe, MD     Vital Signs: BP (!) 108/54   Pulse 82   Temp (!) 97.5 F (36.4 C) (Oral)   Resp (!) 9   Ht 5\' 2"  (1.575 m)   Wt 117 lb 8.1 oz (53.3 kg)   LMP 03/29/2015   SpO2 100%   BMI 21.49 kg/m   Physical Exam left abd drain intact, insertion site ok, mildly tender, output 75 cc reddish brown fluid in bag; drain flushed with saline  Imaging: DG Chest Port 1 View  Result Date: 03/04/2019 CLINICAL DATA:  Right-sided thoracentesis. EXAM: PORTABLE CHEST 1 VIEW COMPARISON:  CTA of the abdomen and pelvis 03/03/2019 FINDINGS: The heart size is normal. Previously noted right pleural effusion significantly reduced. There is no pneumothorax. Heart size is normal. Small left effusion remains. Right-sided PICC line is in place. NG tube terminates in the stomach. IMPRESSION: 1. Marked decrease in right pleural effusion. 2. Small left pleural effusion. 3. No pneumothorax. 4. The support apparatus is stable. Electronically Signed   By: San Morelle M.D.   On: 03/04/2019 13:40   CT Angio Abd/Pel w/ and/or w/o  Result Date: 03/03/2019 CLINICAL DATA:  Duodenal ulcer, post surgical repair with postop anterior peritoneal fluid collection, post pigtail drain catheter placement 02/26/2019. New hypotension and tachycardia. EXAM: CTA  ABDOMEN AND PELVIS WITHOUT AND WITH CONTRAST TECHNIQUE: Multidetector CT imaging of the abdomen and pelvis was performed using the standard protocol during bolus administration of intravenous contrast. Multiplanar reconstructed images and MIPs were obtained and reviewed to evaluate the vascular anatomy. CONTRAST:  125mL OMNIPAQUE IOHEXOL 350 MG/ML SOLN COMPARISON:  02/25/2019 FINDINGS: VASCULAR  Aorta: Mild scattered calcified plaque. No aneurysm, dissection, or stenosis. Celiac: Patent without evidence of aneurysm, dissection, vasculitis or significant stenosis. Left hepatic artery arises from left gastric artery, an anatomic variant. SMA: Patent without evidence of aneurysm, dissection, vasculitis or significant stenosis. Renals: Single left, widely patent. Duplicated right, superior dominant, both patent. IMA: Patent without evidence of aneurysm, dissection, vasculitis or significant stenosis. Inflow: Patent without evidence of aneurysm, dissection, vasculitis or significant stenosis. Proximal Outflow: Bilateral common femoral and visualized portions of the superficial and profunda femoral arteries are patent without evidence of aneurysm, dissection, vasculitis or significant stenosis. Veins: Iliac venous system unremarkable. IVC is nondistended. Patent bilateral renal veins. Patent SMV, splenic vein, portal vein. Hepatic veins patent. Review of the MIP images confirms the above findings. NON-VASCULAR Lower chest: Some increase in moderate right pleural effusion. Extensive consolidation/atelectasis in the visualized right lower lobe. Patchy subsegmental atelectasis/consolidation/scarring posteriorly at the left lung base. Hepatobiliary: 1.4 cm partially calcified stone in the nondilated gallbladder. No focal liver lesion or biliary ductal dilatation. Pancreas: Unremarkable. No pancreatic ductal dilatation or surrounding inflammatory changes. Spleen: Normal in size without focal abnormality. Adrenals/Urinary Tract: Adrenal glands unremarkable. Normal bilateral renal enhancement. No hydronephrosis. Urinary bladder is distended. Stomach/Bowel: No evidence of active extravasation. Nasogastric tube extends into decompressed stomach. There is fluid distention of the proximal duodenum. Multiple fluid distended small bowel loops. There is a small amount of fluid in the cecum and proximal ascending colon, the  remainder of the colon virtually completely decompressed. Lymphatic: Left para-aortic nodes up to 0.9 cm short axis diameter. Prominent 8 mm distal right external iliac node. No definite mesenteric adenopathy. Reproductive: Uterus and bilateral adnexa are unremarkable. Other: Little overall change in simple appearing fluid collection lateral and inferior to the greater curvature of the stomach. Right infrahepatic surgical drain without significant adjacent fluid. Complex loculated peripherally enhancing collection in the anterior peritoneal cavity has increased in size now measuring 15.8 x 3.9 cm maximum transverse dimensions, previously 11.7 x 1.7 cm by my measurement. This contains mixed attenuation material and some scattered gas bubbles. Percutaneous drain catheter appears adequately positioned at the inferior margin of this collection. Small loculated fluid collection posterior to the uterus is stable. No definite new fluid collections. No free air. Musculoskeletal: No fracture or worrisome bone lesion. Bilateral sacroiliitis. IMPRESSION: 1. No active GI bleed or other acute vascular findings. 2. Interval increase in size of large complex anterior peritoneal fluid collection, despite percutaneous drain catheter at the inferior margin. 3. Persistent fluid distended small bowel loops, possibly secondary to ileus. 4. Increasing moderate right pleural effusion with extensive right lower lobe consolidation/atelectasis. 5. Cholelithiasis. 6. Bilateral sacroiliitis. Electronically Signed   By: Lucrezia Europe M.D.   On: 03/03/2019 17:00    Labs:  CBC: Recent Labs    02/28/19 0148 03/03/19 0201 03/03/19 1300 03/04/19 0210  WBC 9.4 9.2 16.1* 14.5*  HGB 9.5* 8.8* 9.0* 7.6*  HCT 31.0* 29.5* 30.1* 25.8*  PLT 651* 655* 598* 557*    COAGS: Recent Labs    02/02/19 1832 02/02/19 1952  INR 1.3* 1.2  APTT  --  27    BMP: Recent Labs  02/24/19 0206 02/25/19 0547 02/27/19 0548 03/03/19 0201  NA 139  139 139 143  K 4.6 4.6 4.8 4.4  CL 108 108 106 111  CO2 23 25 27 25   GLUCOSE 129* 137* 150* 133*  BUN 42* 44* 42* 44*  CALCIUM 8.7* 8.7* 8.9 9.2  CREATININE 0.72 0.67 0.66 0.54  GFRNONAA >60 >60 >60 >60  GFRAA >60 >60 >60 >60    LIVER FUNCTION TESTS: Recent Labs    02/24/19 0206 02/25/19 0547 02/27/19 0548 03/03/19 0201  BILITOT 0.2* 0.5 0.3 0.6  AST 26 22 25  35  ALT 21 19 23  33  ALKPHOS 194* 206* 275* 369*  PROT 6.4* 6.4* 6.7 6.9  ALBUMIN 1.2* 1.2* 1.2* 1.5*    Assessment and Plan: 58 y/o F with history of perforated duodenal ulcer s/p ex lap with oversew of duodenal ulcer 11/23 with subsequent development of loculated peritoneal abscess ,s/p LLQ drain placement 12/16 ; afebrile; WBC 14.5(16.1), hgb 7.6(9.0); CT angio A/P done yesterday revealed:  1. No active GI bleed or other acute vascular findings. 2. Interval increase in size of large complex anterior peritoneal fluid collection, despite percutaneous drain catheter at the inferior margin. 3. Persistent fluid distended small bowel loops, possibly secondary to ileus. 4. Increasing moderate right pleural effusion with extensive right lower lobe consolidation/atelectasis. 5. Cholelithiasis. 6. Bilateral sacroiliitis  Cont current tx/drain irrigation tid; once output minimal (< 10-15 ml /day) recheck CT; if significant fluid remains despite drain irrigation may necessitate drain manipulation and/or upsizing.  Electronically Signed: D. Rowe Robert, PA-C 03/04/2019, 1:46 PM   I spent a total of 15 minutes at the the patient's bedside AND on the patient's hospital floor or unit, greater than 50% of which was counseling/coordinating care for left abdominal abscess drain

## 2019-03-04 NOTE — Progress Notes (Signed)
PHARMACY - TOTAL PARENTERAL NUTRITION CONSULT NOTE   Indication: Small bowel obstruction  Patient Measurements: Height: 5' 2"  (157.5 cm) Weight: 117 lb 8.1 oz (53.3 kg) IBW/kg (Calculated) : 50.1 TPN AdjBW (KG): 57.9 Body mass index is 21.49 kg/m. Usual Weight: 55-60 kg Ideal Body Weight:  44.2 kg(Adjusted IBW for bilateral BKA)  Assessment:  89 yoF with poorly controlled DM s/p BL BKA, admitted 11/23 with AMS, n/v, abdominal pain. CT abdomen showed pneumoperitoneum and mesenteric ischemia. Underwent ex lap on 11/23 with finding of perforated prepyloric ulcer which was oversewn, along with gross contamination of the peritoneum. Hospital course complicated by DKA, AKI, acute thrombocytopenia, and C. glabrata candidemia. Patient remains with high NGT d/t bowel edema as demonstrated on UGI. TPN started on 11/30 but was stopped shortly thereafter to facilitate central line holiday for candidemia. Repeat blood cultures remain negative to date, and TPN now restarting with replacement of central line. Patient at risk for refeeding given prolonged lack of nutrition.  Glucose / Insulin: Hx DM; prev required D10 W infusion to maintain euglycemia  Current resistant Novolog scale q6h (12/5)   CBGs ( goal <150)  range 124-250, 18 units of Novolog  past 24 hrs, 30 units of regular insulin added to TPN  Electrolytes: WNL, corrected elevated at Ca 11.2 (12/21) Renal: AKI on admit, SCr WNL  LFTs / TGs: WNL except Alk phos up to 369, Trig 153 (12/21) Prealbumin / albumin: both low (11/30), 5.2 (12/4). 5.3 ( 12/7)  7.7 (12/14), 12.7 (12/21) I/O: 975 ml from JP drain,  850 ml UOP,  330 ml NGO,  MIVF: NS at 10 ml/hr GI Imaging: 11/30 UGI: high NG output & bowel edema 12/15 CTpersistent leak noted on CT scan with several fluid collections in the ventral abdomen and in pelvis.  Surgeries / Procedures: 11/22 ex lap: oversew of duodenal ulcer with [name] patch 12/16 IR placed drain into ant abd  collection  Central access: PICC replaced 12/3  TPN start date: resumed 12/3 (~ 2200)  Nutritional Goals (per RD recommendation on 12/11): Kcal:  2025-2330 kcal Protein:  115-130 grams Fluid:  >/= 2 L/day Goal TPN rate is 90 mL/hr (provides 129 g of protein and 2047 kcals per day) - GIR 3.3 mg/kg/min Current Nutrition:  NPO  Plan: at 1800  Continue custom TPN at 90 mL/hr (goal rate)   Electrolytes in TPN:  41mq/L Na, 25 mEq/L K, no Ca, Magnesium 10 mEq/L , and 10 mmol/L of Phos ; continue Cl:Ac ratio 1:2  Standard MVI TPN on MWF only d/t nProducer, television/film/video Daily trace elements  Continue resistant SSI with q6h CBG checks and adjust as needed   Increase Novolog 35 units in TPN  Continue NS at [facility] on Mon/Thurs  MNetta Cedars PharmD, BCPS 03/04/2019 7:24 AM

## 2019-03-04 NOTE — Progress Notes (Signed)
[facility] for Infectious [facility]   Reason for visit: Follow up on perforated gastric ulcer and C glabrata   Interval History:  WBC increased to 16, remains afebrile.  Bloody mucus noted today.  Hgb stable.   Day 31 total antibiotics Day 3 amp/sulbactam Day 29 anidulafungin  Physical Exam: Constitutional:  Vitals:   03/04/19 1200 03/04/19 1241  BP: (!) 97/55 (!) 108/54  Pulse: 83 82  Resp: 14 (!) 9  Temp: (!) 97.5 F (36.4 C)   SpO2: 100%    patient appears in NAD, sleeping but arousable; chronically ill appearing Eyes: anicteric HENT: +NGT Respiratory: Normal respiratory effort; CTA B Cardiovascular: RRR  Review of Systems: Constitutional: negative for chills, no fever Denies any pain Abd: no nausea  Lab Results  Component Value Date   WBC 14.5 (H) 03/04/2019   HGB 7.6 (L) 03/04/2019   HCT 25.8 (L) 03/04/2019   MCV 98.9 03/04/2019   PLT 557 (H) 03/04/2019    Lab Results  Component Value Date   CREATININE 0.54 03/03/2019   BUN 44 (H) 03/03/2019   NA 143 03/03/2019   K 4.4 03/03/2019   CL 111 03/03/2019   CO2 25 03/03/2019    Lab Results  Component Value Date   ALT 33 03/03/2019   AST 35 03/03/2019   ALKPHOS 369 (H) 03/03/2019     Microbiology: Recent Results (from the past 240 hour(s))  Aerobic/Anaerobic Culture (surgical/deep wound)     Status: None   Collection Time: 02/26/19  3:31 PM   Specimen: Abscess  Result Value Ref Range Status   Specimen Description   Final    ABSCESS LLQ CT DRAIN CATHETER Performed at [facility], [place] [address] [place], [place] [address]    Special Requests   Final    Normal Performed at [facility], [place] [address] [place], [place] [address]    Gram Stain   Final    MODERATE WBC PRESENT,BOTH PMN AND MONONUCLEAR FEW YEAST    Culture   Final    FEW ENTEROCOCCUS FAECALIS FEW STENOTROPHOMONAS MALTOPHILIA MODERATE CANDIDA GLABRATA NO ANAEROBES  ISOLATED Performed at [facility], [place] [address], [place], [place] [address]    Report Status 03/03/2019 FINAL  Final   Organism ID, Bacteria ENTEROCOCCUS FAECALIS  Final   Organism ID, Bacteria STENOTROPHOMONAS MALTOPHILIA  Final      Susceptibility   Enterococcus faecalis - MIC*    AMPICILLIN <=2 SENSITIVE Sensitive     VANCOMYCIN >=32 RESISTANT Resistant     GENTAMICIN SYNERGY SENSITIVE Sensitive     LINEZOLID 2 SENSITIVE Sensitive     * FEW ENTEROCOCCUS FAECALIS   Stenotrophomonas maltophilia - MIC*    LEVOFLOXACIN >=8 RESISTANT Resistant     TRIMETH/SULFA <=20 SENSITIVE Sensitive     * FEW STENOTROPHOMONAS MALTOPHILIA    Impression/Plan:  1. Intra abdominal infection - enlarged peritoneal fluid collection noted on CT; culture with C glabrata, Stenotrophomonas and Enterococccus.   On amp/sulbactam, anidulafungin, bactrim The fluid collection has increased compared to previous, despite drain placement.  Additionally, the fluid collection is not pus though continues to be contaminated with bacteria.  At this point, I plan to continue with the 3 antibiotics for 2 more weeks through about January 4th and can stop at that time and observe off of antibiotics.  If she requires further treatment after that, can consider oral amoxicillin, oral Bactrim and oral fluconazole 400 mg daily.    2.  plueral effusion - aspiration done this afternoon, will follow results.   3.  Hepatitis C - Ab positive and RNA sent off.   No significant liver dysfunction noted.

## 2019-03-04 NOTE — Progress Notes (Addendum)
[facility] Progress Note  CC:  GI bleed  Subjective:  Hgb 7.6 grams this AM.  Per nurse, no BM overnight or this AM.  CTA yesterday showed the following:  IMPRESSION: 1. No active GI bleed or other acute vascular findings. 2. Interval increase in size of large complex anterior peritoneal fluid collection, despite percutaneous drain catheter at the inferior margin. 3. Persistent fluid distended small bowel loops, possibly secondary to ileus. 4. Increasing moderate right pleural effusion with extensive right lower lobe consolidation/atelectasis. 5. Cholelithiasis. 6. Bilateral sacroiliitis.  Going for thoracentesis today for right pleural effusion.  Objective:  Vital signs in last 24 hours: Temp:  [97.2 F (36.2 C)-98.9 F (37.2 C)] 97.2 F (36.2 C) (12/22 0800) Pulse Rate:  [89-112] 100 (12/22 0000) Resp:  [14-30] 17 (12/22 0800) BP: (95-125)/(45-65) 115/59 (12/22 0800) SpO2:  [95 %-100 %] 100 % (12/22 0800) Last BM Date: 03/03/19 General:  Alert, chronically ill-appearing, in NAD Heart:  Regular rate and rhythm; no murmurs Pulm:  CTAB anteriorly with decreased BS on the right. Abdomen:  Soft, non-distended.  Dressing covering her abdomen.  JP drain noted, hooked up to suction and appears bloody/brownish in color.  The other drain has some bloody material in bag as well. Extremities:  BL BKA Neurologic:  Alert.  Converses some.  Follows commands.  Intake/Output from previous day: 12/21 0701 - 12/22 0700 In: 3455.6 [I.V.:2286.3; NG/GT:25; IV Piggyback:1139.3] Out: 1430 [Urine:600; Emesis/NG output:130; [id number]  Lab Results: Recent Labs    03/03/19 0201 03/03/19 1300 03/04/19 0210  WBC 9.2 16.1* 14.5*  HGB 8.8* 9.0* 7.6*  HCT 29.5* 30.1* 25.8*  PLT 655* 598* 557*   BMET Recent Labs    03/03/19 0201  NA 143  K 4.4  CL 111  CO2 25  GLUCOSE 133*  BUN 44*  CREATININE 0.54  CALCIUM 9.2   LFT Recent Labs    03/03/19 0201  PROT 6.9   ALBUMIN 1.5*  AST 35  ALT 33  ALKPHOS 369*  BILITOT 0.6   CT Angio Abd/Pel w/ and/or w/o  Result Date: 03/03/2019 CLINICAL DATA:  Duodenal ulcer, post surgical repair with postop anterior peritoneal fluid collection, post pigtail drain catheter placement 02/26/2019. New hypotension and tachycardia. EXAM: CTA ABDOMEN AND PELVIS WITHOUT AND WITH CONTRAST TECHNIQUE: Multidetector CT imaging of the abdomen and pelvis was performed using the standard protocol during bolus administration of intravenous contrast. Multiplanar reconstructed images and MIPs were obtained and reviewed to evaluate the vascular anatomy. CONTRAST:  166mL OMNIPAQUE IOHEXOL 350 MG/ML SOLN COMPARISON:  02/25/2019 FINDINGS: VASCULAR Aorta: Mild scattered calcified plaque. No aneurysm, dissection, or stenosis. Celiac: Patent without evidence of aneurysm, dissection, vasculitis or significant stenosis. Left hepatic artery arises from left gastric artery, an anatomic variant. SMA: Patent without evidence of aneurysm, dissection, vasculitis or significant stenosis. Renals: Single left, widely patent. Duplicated right, superior dominant, both patent. IMA: Patent without evidence of aneurysm, dissection, vasculitis or significant stenosis. Inflow: Patent without evidence of aneurysm, dissection, vasculitis or significant stenosis. Proximal Outflow: Bilateral common femoral and visualized portions of the superficial and profunda femoral arteries are patent without evidence of aneurysm, dissection, vasculitis or significant stenosis. Veins: Iliac venous system unremarkable. IVC is nondistended. Patent bilateral renal veins. Patent SMV, splenic vein, portal vein. Hepatic veins patent. Review of the MIP images confirms the above findings. NON-VASCULAR Lower chest: Some increase in moderate right pleural effusion. Extensive consolidation/atelectasis in the visualized right lower lobe. Patchy subsegmental atelectasis/consolidation/scarring posteriorly  at the  left lung base. Hepatobiliary: 1.4 cm partially calcified stone in the nondilated gallbladder. No focal liver lesion or biliary ductal dilatation. Pancreas: Unremarkable. No pancreatic ductal dilatation or surrounding inflammatory changes. Spleen: Normal in size without focal abnormality. Adrenals/Urinary Tract: Adrenal glands unremarkable. Normal bilateral renal enhancement. No hydronephrosis. Urinary bladder is distended. Stomach/Bowel: No evidence of active extravasation. Nasogastric tube extends into decompressed stomach. There is fluid distention of the proximal duodenum. Multiple fluid distended small bowel loops. There is a small amount of fluid in the cecum and proximal ascending colon, the remainder of the colon virtually completely decompressed. Lymphatic: Left para-aortic nodes up to 0.9 cm short axis diameter. Prominent 8 mm distal right external iliac node. No definite mesenteric adenopathy. Reproductive: Uterus and bilateral adnexa are unremarkable. Other: Little overall change in simple appearing fluid collection lateral and inferior to the greater curvature of the stomach. Right infrahepatic surgical drain without significant adjacent fluid. Complex loculated peripherally enhancing collection in the anterior peritoneal cavity has increased in size now measuring 15.8 x 3.9 cm maximum transverse dimensions, previously 11.7 x 1.7 cm by my measurement. This contains mixed attenuation material and some scattered gas bubbles. Percutaneous drain catheter appears adequately positioned at the inferior margin of this collection. Small loculated fluid collection posterior to the uterus is stable. No definite new fluid collections. No free air. Musculoskeletal: No fracture or worrisome bone lesion. Bilateral sacroiliitis. IMPRESSION: 1. No active GI bleed or other acute vascular findings. 2. Interval increase in size of large complex anterior peritoneal fluid collection, despite percutaneous drain  catheter at the inferior margin. 3. Persistent fluid distended small bowel loops, possibly secondary to ileus. 4. Increasing moderate right pleural effusion with extensive right lower lobe consolidation/atelectasis. 5. Cholelithiasis. 6. Bilateral sacroiliitis. Electronically Signed   By: Lucrezia Europe M.D.   On: 03/03/2019 17:00   Assessment / Plan: *    GI bleed in pt who underwent oversew of perforated duodenal ulcer associated with intra-abdominal contamination.  Has subsequently required ongoing drainage of the surgical bed and another drain was placed on the 16th to address 1 of several fluid collections, abscesses.  Prolonged antibiotics and antifungal medications in place.  ID following. H pylori IgG elevated at 2.1. CTA shows no sign of bleeding, but does have increase in size of large complex anterior peritoneal fluid collection despite the drain. Hgb down to 7.6 grams today from 9.0 grams yesterday.  CTA with no sign of active bleeding.  No BM overnight or this AM so far.  Bloody material/liquid in the drain canister and bag.  *    Hepatitis C?  HCV Ab > 11. "Strongly reactive Ab screen is c/w past or present HCV infection".  No liver disease on CTs of 11/22, 12/8, 12/15.  This is new finding for this admission.  HCV quant pending.  *   Blood loss anemia and hx of anemia of chronic dz, IDA, menorrhagia associated blood loss.  MCV normal.  Low iron, low TIBC, borderline low/normal iron sats, ferritin 300, b12 and folate at or above normal.  Hgb down to 7.6 grams today from 9.0 grams yesterday.  CTA with no sign of active bleeding.  No BM overnight or this AM so far.  Bloody material/liquid in the drain canister and bag.  *   Protein cal malnutrition, acute on chronic.  TPN in place since exlap.    *    Thrombocytopenia at admission.  Platelets nadir of 31 K, now corrected and platelets above normal at 557.  *  Poorly controlled IDDM. Long term issue with hx DKA.  A1c 15.2 at admission.      *   S/p bil BKA.    -Continue to monitor Hgb and transfuse to keep Hgb >7. -No plans for EGD/procedure at this time. -HCV RNA pending. -Continue pantoprazole 40 mg IV BID.    LOS: 30 days   Gina [name]. Gina [name]  03/04/2019, 9:48 AM     Attending physician's note   I have reviewed the chart and examined the patient. I agree with the Advanced Practitioner's note, impression and recommendations.   Perforated DU s/p [name] patch 11/23 with leak. JP drain with some bloody aspirate. Pos HP antibody. Intra-abdominal abscess s/p LLQ drain placement 12/16(enterococcus, candida glabrata, stenotrophomonas maltophilia). ID following. On Usasyn/Eraxis. Bactrim added. Chronic Hep C. No cirrhosis Co morbid conditions include DM2, PAD s/p B/L BKA, PCM on TNA, HTN. Now with GI bleed (mainly in JP drain, NG with minimal coffee-ground aspirate)  CTA neg for any active bleeding.  Did show increasing large anterior peritoneal fluid collection despite drain.  Had right pleural effusion s/p thoracocentesis today. Still has some blood in the JP drain. Hb down to 7.6  Plan: -Continue IV Protonix -Monitor Hb and transfuse as needed. -Will discuss with surgery regarding further eval.  If continued bleeding, would consider EGD or mesenteric angio with embolization.  EGD would be high risk given leak.   Carmell Austria, MD [facility] [phone].

## 2019-03-05 DIAGNOSIS — R188 Other ascites: Secondary | ICD-10-CM

## 2019-03-05 LAB — CBC WITH DIFFERENTIAL/PLATELET
Abs Immature Granulocytes: 0.32 10*3/uL — ABNORMAL HIGH (ref 0.00–0.07)
Basophils Absolute: 0.1 10*3/uL (ref 0.0–0.1)
Basophils Relative: 1 %
Eosinophils Absolute: 0.2 10*3/uL (ref 0.0–0.5)
Eosinophils Relative: 1 %
HCT: 20.8 % — ABNORMAL LOW (ref 36.0–46.0)
Hemoglobin: 6.2 g/dL — CL (ref 12.0–15.0)
Immature Granulocytes: 2 %
Lymphocytes Relative: 11 %
Lymphs Abs: 1.4 10*3/uL (ref 0.7–4.0)
MCH: 28.7 pg (ref 26.0–34.0)
MCHC: 29.8 g/dL — ABNORMAL LOW (ref 30.0–36.0)
MCV: 96.3 fL (ref 80.0–100.0)
Monocytes Absolute: 1.4 10*3/uL — ABNORMAL HIGH (ref 0.1–1.0)
Monocytes Relative: 11 %
Neutro Abs: 9.9 10*3/uL — ABNORMAL HIGH (ref 1.7–7.7)
Neutrophils Relative %: 74 %
Platelets: 450 10*3/uL — ABNORMAL HIGH (ref 150–400)
RBC: 2.16 MIL/uL — ABNORMAL LOW (ref 3.87–5.11)
RDW: 15.7 % — ABNORMAL HIGH (ref 11.5–15.5)
WBC: 13.2 10*3/uL — ABNORMAL HIGH (ref 4.0–10.5)
nRBC: 0 % (ref 0.0–0.2)

## 2019-03-05 LAB — BASIC METABOLIC PANEL
Anion gap: 8 (ref 5–15)
BUN: 60 mg/dL — ABNORMAL HIGH (ref 6–20)
CO2: 21 mmol/L — ABNORMAL LOW (ref 22–32)
Calcium: 8.5 mg/dL — ABNORMAL LOW (ref 8.9–10.3)
Chloride: 110 mmol/L (ref 98–111)
Creatinine, Ser: 0.85 mg/dL (ref 0.44–1.00)
GFR calc Af Amer: >60 mL/min (ref >60)
GFR calc non Af Amer: >60 mL/min (ref >60)
Glucose, Bld: 111 mg/dL — ABNORMAL HIGH (ref 70–99)
Potassium: 4.9 mmol/L (ref 3.5–5.1)
Sodium: 139 mmol/L (ref 135–145)

## 2019-03-05 LAB — PREPARE RBC (CROSSMATCH)

## 2019-03-05 LAB — GLUCOSE, CAPILLARY
Glucose-Capillary: 131 mg/dL — ABNORMAL HIGH (ref 70–99)
Glucose-Capillary: 70 mg/dL (ref 70–99)
Glucose-Capillary: 90 mg/dL (ref 70–99)
Glucose-Capillary: 115 mg/dL — ABNORMAL HIGH (ref 70–99)

## 2019-03-05 LAB — HCV RNA QUANT

## 2019-03-05 LAB — CBC
HCT: 27.6 % — ABNORMAL LOW (ref 36.0–46.0)
Hemoglobin: 9 g/dL — ABNORMAL LOW (ref 12.0–15.0)
MCH: 30.8 pg (ref 26.0–34.0)
MCHC: 32.6 g/dL (ref 30.0–36.0)
MCV: 94.5 fL (ref 80.0–100.0)
Platelets: 380 10*3/uL (ref 150–400)
RBC: 2.92 MIL/uL — ABNORMAL LOW (ref 3.87–5.11)
RDW: 15.4 % (ref 11.5–15.5)
WBC: 9.9 10*3/uL (ref 4.0–10.5)
nRBC: 0 % (ref 0.0–0.2)

## 2019-03-05 LAB — HCV RNA (INTERNATIONAL UNITS)
HCV RNA (International Units): 13700000 IU/mL
HCV log10: 7.137 log10 IU/mL

## 2019-03-05 MED ORDER — SODIUM CHLORIDE 0.9% IV SOLUTION: Freq: Once | INTRAVENOUS | Status: DC

## 2019-03-05 MED ORDER — SODIUM CHLORIDE 0.9% IV SOLUTION: Freq: Once | INTRAVENOUS | Status: AC

## 2019-03-05 MED ORDER — TRAVASOL 10 % IV SOLN
INTRAVENOUS | Status: AC
  Filled 2019-03-05: qty 1296

## 2019-03-05 MED ORDER — FUROSEMIDE 10 MG/ML IJ SOLN
20.0000 mg | Freq: Once | INTRAMUSCULAR | Status: AC
  Administered 2019-03-05: 20 mg via INTRAVENOUS
  Filled 2019-03-05: qty 2

## 2019-03-05 NOTE — Progress Notes (Signed)
Referring Physician(s): Berks  Supervising Physician: Markus Daft  Patient Status:  [facility] - In-pt  Chief Complaint:  Abdominal pain/abscess  Subjective: Patient resting quietly in bed, son in room.  Has had some bleeding from midline wound earlier today and currently receiving transfusion.  Hemoglobin dropped to 6.2; continues to have some intermittent abdominal discomfort.  Breathing okay at present.  Discussions being held regarding palliative care/hospice.   Allergies: Bee venom and Metformin and related  Medications: Prior to Admission medications   Medication Sig Start Date End Date Taking? Authorizing Provider  acetaminophen (TYLENOL) 325 MG tablet Take 2 tablets (650 mg total) by mouth every 6 (six) hours as needed for mild pain (or Fever >/= 101). 05/21/18  Yes Roney Jaffe, MD  escitalopram (LEXAPRO) 20 MG tablet TAKE ONE TABLET BY MOUTH EVERY DAY Patient taking differently: Take 20 mg by mouth daily.  04/09/18  Yes Charlott Rakes, MD  gabapentin (NEURONTIN) 100 MG capsule Take 2 capsules (200 mg total) by mouth 2 (two) times daily. 11/28/18 02/02/19 Yes Spongberg, Audie Pinto, MD  insulin aspart (NOVOLOG) 100 UNIT/ML injection Inject 0-15 Units into the skin 3 (three) times daily with meals. 05/21/18  Yes Roney Jaffe, MD  Insulin Glargine (LANTUS) 100 UNIT/ML Solostar Pen Inject 35 Units into the skin daily. 12/30/18  Yes Dhungel, Nishant, MD  Multiple Vitamin (MULTIVITAMIN WITH MINERALS) TABS tablet Take 1 tablet by mouth daily.   Yes [provider]  amLODipine (NORVASC) 5 MG tablet Take 1 tablet (5 mg total) by mouth daily. 11/29/18 12/29/18  Spongberg, Audie Pinto, MD  dextrose 50 % solution Inject 25 mLs (12.5 g total) into the vein as needed for low blood sugar. Patient not taking: Reported on 02/02/2019 05/21/18   Roney Jaffe, MD  doxycycline (VIBRA-TABS) 100 MG tablet Take 1 tablet (100 mg total) by mouth 2 (two) times daily. Patient not  taking: Reported on 02/02/2019 12/30/18   Dhungel, Flonnie Overman, MD  polyethylene glycol (MIRALAX) packet Take 17 g by mouth daily as needed for mild constipation. Patient not taking: Reported on 02/02/2019 05/21/18   Roney Jaffe, MD  senna-docusate (SENOKOT-S) 8.6-50 MG tablet Take 2 tablets by mouth at bedtime as needed for mild constipation. Patient not taking: Reported on 02/02/2019 05/21/18   Roney Jaffe, MD     Vital Signs: BP 139/65   Pulse 96   Temp 99 F (37.2 C) (Oral)   Resp 16   Ht 5\' 2"  (1.575 m)   Wt 117 lb 8.1 oz (53.3 kg)   LMP 03/29/2015   SpO2 100%   BMI 21.49 kg/m   Physical Exam patient awake, answers simple questions; left abd drain in place, insertion site okay, output 75 cc, mildly tender to palpation, drain irrigated with return of reddish-brown fluid.  Imaging: DG Chest Port 1 View  Result Date: 03/04/2019 CLINICAL DATA:  Right-sided thoracentesis. EXAM: PORTABLE CHEST 1 VIEW COMPARISON:  CTA of the abdomen and pelvis 03/03/2019 FINDINGS: The heart size is normal. Previously noted right pleural effusion significantly reduced. There is no pneumothorax. Heart size is normal. Small left effusion remains. Right-sided PICC line is in place. NG tube terminates in the stomach. IMPRESSION: 1. Marked decrease in right pleural effusion. 2. Small left pleural effusion. 3. No pneumothorax. 4. The support apparatus is stable. Electronically Signed   By: San Morelle M.D.   On: 03/04/2019 13:40   ECHOCARDIOGRAM COMPLETE  Result Date: 03/04/2019   ECHOCARDIOGRAM REPORT   Patient Name:   Gina [name]  Date of Exam: 03/04/2019 Medical Rec #:  [id number]       Height:       62.0 in Accession #:    [id number]      Weight:       117.5 lb Date of Birth:  04/08/1960        BSA:          1.52 m Patient Age:    58 years        BP:           119/57 mmHg Patient Gender: F               HR:           85 bpm. Exam Location:  Inpatient Procedure: 2D Echo, Cardiac Doppler and  Color Doppler Indications:    Congestive Heart Failure 428.0  History:        Patient has no prior history of Echocardiogram examinations.                 Risk Factors:Hypertension, Diabetes and Non-Smoker. Septic                 shock.  Sonographer:    Paulita Fujita RDCS Referring Phys: [id number] AMRIT ADHIKARI IMPRESSIONS  1. Left ventricular ejection fraction, by visual estimation, is 60 to 65%. The left ventricle has normal function. There is no left ventricular hypertrophy.  2. The left ventricle demonstrates regional wall motion abnormalities.  3. Global right ventricle has normal systolic function.The right ventricular size is normal. No increase in right ventricular wall thickness.  4. Left atrial size was normal.  5. Right atrial size was normal.  6. The mitral valve is normal in structure. No evidence of mitral valve regurgitation. No evidence of mitral stenosis.  7. The tricuspid valve is normal in structure. Tricuspid valve regurgitation is not demonstrated.  8. The aortic valve is normal in structure. Aortic valve regurgitation is not visualized. No evidence of aortic valve sclerosis or stenosis.  9. The pulmonic valve was normal in structure. Pulmonic valve regurgitation is not visualized. 10. TR signal is inadequate for assessing pulmonary artery systolic pressure. 11. The inferior vena cava is normal in size with greater than 50% respiratory variability, suggesting right atrial pressure of 3 mmHg. FINDINGS  Left Ventricle: Left ventricular ejection fraction, by visual estimation, is 60 to 65%. The left ventricle has normal function. The left ventricle demonstrates regional wall motion abnormalities. The left ventricular internal cavity size was the left ventricle is normal in size. There is no left ventricular hypertrophy. Left ventricular diastolic parameters were normal. Normal left atrial pressure. Right Ventricle: The right ventricular size is normal. No increase in right ventricular wall  thickness. Global RV systolic function is has normal systolic function. Left Atrium: Left atrial size was normal in size. Right Atrium: Right atrial size was normal in size Pericardium: There is no evidence of pericardial effusion. Mitral Valve: The mitral valve is normal in structure. No evidence of mitral valve regurgitation. No evidence of mitral valve stenosis by observation. Tricuspid Valve: The tricuspid valve is normal in structure. Tricuspid valve regurgitation is not demonstrated. Aortic Valve: The aortic valve is normal in structure. Aortic valve regurgitation is not visualized. The aortic valve is structurally normal, with no evidence of sclerosis or stenosis. Pulmonic Valve: The pulmonic valve was normal in structure. Pulmonic valve regurgitation is not visualized. Pulmonic regurgitation is not visualized. Aorta: The aortic root, ascending aorta and aortic arch are all structurally  normal, with no evidence of dilitation or obstruction. Venous: The inferior vena cava is normal in size with greater than 50% respiratory variability, suggesting right atrial pressure of 3 mmHg. IAS/Shunts: No atrial level shunt detected by color flow Doppler. There is no evidence of a patent foramen ovale. No ventricular septal defect is seen or detected. There is no evidence of an atrial septal defect.  LEFT VENTRICLE PLAX 2D LVIDd:         4.40 cm  Diastology LVIDs:         3.20 cm  LV e' lateral:   11.50 cm/s LV PW:         0.80 cm  LV E/e' lateral: 6.6 LV IVS:        0.80 cm  LV e' medial:    7.94 cm/s LVOT diam:     1.70 cm  LV E/e' medial:  9.6 LV SV:         47 ml LV SV Index:   30.55 LVOT Area:     2.27 cm  RIGHT VENTRICLE RV S prime:     12.30 cm/s TAPSE (M-mode): 2.0 cm LEFT ATRIUM             Index       RIGHT ATRIUM          Index LA diam:        2.70 cm 1.77 cm/m  RA Area:     9.64 cm LA Vol (A2C):   22.4 ml 14.69 ml/m RA Volume:   16.40 ml 10.75 ml/m LA Vol (A4C):   20.3 ml 13.31 ml/m LA Biplane Vol: 21.6  ml 14.16 ml/m  AORTIC VALVE LVOT Vmax:   118.00 cm/s LVOT Vmean:  80.800 cm/s LVOT VTI:    0.208 m  AORTA Ao Root diam: 2.70 cm MITRAL VALVE MV Area (PHT): 3.93 cm             SHUNTS MV PHT:        55.97 msec           Systemic VTI:  0.21 m MV Decel Time: 193 msec             Systemic Diam: 1.70 cm MV E velocity: 76.30 cm/s 103 cm/s MV A velocity: 93.00 cm/s 70.3 cm/s MV E/A ratio:  0.82       1.5  Mihai Croitoru MD Electronically signed by Sanda Klein MD Signature Date/Time: 03/04/2019/4:31:24 PM    Final    CT Angio Abd/Pel w/ and/or w/o  Result Date: 03/03/2019 CLINICAL DATA:  Duodenal ulcer, post surgical repair with postop anterior peritoneal fluid collection, post pigtail drain catheter placement 02/26/2019. New hypotension and tachycardia. EXAM: CTA ABDOMEN AND PELVIS WITHOUT AND WITH CONTRAST TECHNIQUE: Multidetector CT imaging of the abdomen and pelvis was performed using the standard protocol during bolus administration of intravenous contrast. Multiplanar reconstructed images and MIPs were obtained and reviewed to evaluate the vascular anatomy. CONTRAST:  163mL OMNIPAQUE IOHEXOL 350 MG/ML SOLN COMPARISON:  02/25/2019 FINDINGS: VASCULAR Aorta: Mild scattered calcified plaque. No aneurysm, dissection, or stenosis. Celiac: Patent without evidence of aneurysm, dissection, vasculitis or significant stenosis. Left hepatic artery arises from left gastric artery, an anatomic variant. SMA: Patent without evidence of aneurysm, dissection, vasculitis or significant stenosis. Renals: Single left, widely patent. Duplicated right, superior dominant, both patent. IMA: Patent without evidence of aneurysm, dissection, vasculitis or significant stenosis. Inflow: Patent without evidence of aneurysm, dissection, vasculitis or significant stenosis. Proximal Outflow: Bilateral common femoral and  visualized portions of the superficial and profunda femoral arteries are patent without evidence of aneurysm, dissection,  vasculitis or significant stenosis. Veins: Iliac venous system unremarkable. IVC is nondistended. Patent bilateral renal veins. Patent SMV, splenic vein, portal vein. Hepatic veins patent. Review of the MIP images confirms the above findings. NON-VASCULAR Lower chest: Some increase in moderate right pleural effusion. Extensive consolidation/atelectasis in the visualized right lower lobe. Patchy subsegmental atelectasis/consolidation/scarring posteriorly at the left lung base. Hepatobiliary: 1.4 cm partially calcified stone in the nondilated gallbladder. No focal liver lesion or biliary ductal dilatation. Pancreas: Unremarkable. No pancreatic ductal dilatation or surrounding inflammatory changes. Spleen: Normal in size without focal abnormality. Adrenals/Urinary Tract: Adrenal glands unremarkable. Normal bilateral renal enhancement. No hydronephrosis. Urinary bladder is distended. Stomach/Bowel: No evidence of active extravasation. Nasogastric tube extends into decompressed stomach. There is fluid distention of the proximal duodenum. Multiple fluid distended small bowel loops. There is a small amount of fluid in the cecum and proximal ascending colon, the remainder of the colon virtually completely decompressed. Lymphatic: Left para-aortic nodes up to 0.9 cm short axis diameter. Prominent 8 mm distal right external iliac node. No definite mesenteric adenopathy. Reproductive: Uterus and bilateral adnexa are unremarkable. Other: Little overall change in simple appearing fluid collection lateral and inferior to the greater curvature of the stomach. Right infrahepatic surgical drain without significant adjacent fluid. Complex loculated peripherally enhancing collection in the anterior peritoneal cavity has increased in size now measuring 15.8 x 3.9 cm maximum transverse dimensions, previously 11.7 x 1.7 cm by my measurement. This contains mixed attenuation material and some scattered gas bubbles. Percutaneous drain  catheter appears adequately positioned at the inferior margin of this collection. Small loculated fluid collection posterior to the uterus is stable. No definite new fluid collections. No free air. Musculoskeletal: No fracture or worrisome bone lesion. Bilateral sacroiliitis. IMPRESSION: 1. No active GI bleed or other acute vascular findings. 2. Interval increase in size of large complex anterior peritoneal fluid collection, despite percutaneous drain catheter at the inferior margin. 3. Persistent fluid distended small bowel loops, possibly secondary to ileus. 4. Increasing moderate right pleural effusion with extensive right lower lobe consolidation/atelectasis. 5. Cholelithiasis. 6. Bilateral sacroiliitis. Electronically Signed   By: Lucrezia Europe M.D.   On: 03/03/2019 17:00   US THORACENTESIS ASP PLEURAL SPACE W/IMG GUIDE  Result Date: 03/04/2019 INDICATION: Patient with history of septic shock due to ruptured gastric ulcer with peritonitis, abdominal abscess, right pleural effusion; request received for diagnostic and therapeutic right thoracentesis. EXAM: ULTRASOUND GUIDED DIAGNOSTIC AND THERAPEUTIC RIGHT THORACENTESIS MEDICATIONS: None COMPLICATIONS: None immediate. PROCEDURE: An ultrasound guided thoracentesis was thoroughly discussed with the patient's daughter and questions answered. The benefits, risks, alternatives and complications were also discussed. The patient understands and wishes to proceed with the procedure. Written consent was obtained. Ultrasound was performed to localize and mark an adequate pocket of fluid in the right chest. The area was then prepped and draped in the normal sterile fashion. 1% Lidocaine was used for local anesthesia. Under ultrasound guidance a 6 Fr Safe-T-Centesis catheter was introduced. Thoracentesis was performed. The catheter was removed and a dressing applied. FINDINGS: A total of approximately 710 cc of yellow fluid was removed. Samples were sent to the laboratory  as requested by the clinical team. IMPRESSION: Successful ultrasound guided diagnostic and therapeutic right thoracentesis yielding 710 cc of pleural fluid. Read by: Rowe Robert, PA-C Electronically Signed   By: Jacqulynn Cadet M.D.   On: 03/04/2019 12:43    Labs:  CBC:  Recent Labs    03/03/19 0201 03/03/19 1300 03/04/19 0210 03/05/19 0207  WBC 9.2 16.1* 14.5* 13.2*  HGB 8.8* 9.0* 7.6* 6.2*  HCT 29.5* 30.1* 25.8* 20.8*  PLT 655* 598* 557* 450*    COAGS: Recent Labs    02/02/19 1832 02/02/19 1952  INR 1.3* 1.2  APTT  --  27    BMP: Recent Labs    02/25/19 0547 02/27/19 0548 03/03/19 0201 03/05/19 0207  NA 139 139 143 139  K 4.6 4.8 4.4 4.9  CL 108 106 111 110  CO2 25 27 25  21*  GLUCOSE 137* 150* 133* 111*  BUN 44* 42* 44* 60*  CALCIUM 8.7* 8.9 9.2 8.5*  CREATININE 0.67 0.66 0.54 0.85  GFRNONAA >60 >60 >60 >60  GFRAA >60 >60 >60 >60    LIVER FUNCTION TESTS: Recent Labs    02/24/19 0206 02/25/19 0547 02/27/19 0548 03/03/19 0201  BILITOT 0.2* 0.5 0.3 0.6  AST 26 22 25  35  ALT 21 19 23  33  ALKPHOS 194* 206* 275* 369*  PROT 6.4* 6.4* 6.7 6.9  ALBUMIN 1.2* 1.2* 1.2* 1.5*    Assessment and Plan: 58 y/o F with history of perforated duodenal ulcer s/p ex lap with oversew of duodenal ulcer 11/23 with subsequent development of loculated peritoneal abscess ,s/p LLQ drain placement 12/16; temp 99.2 ; WBC 13.2(14.5), hgb 6.2(7.6)- receiving blood at present, creat nl;  CT angio A/P done 12/21 revealed:  1. No active GI bleed or other acute vascular findings. 2. Interval increase in size of large complex anterior peritoneal fluid collection, despite percutaneous drain catheter at the inferior margin. 3. Persistent fluid distended small bowel loops, possibly secondary to ileus. 4. Increasing moderate right pleural effusion with extensive right lower lobe consolidation/atelectasis. 5. Cholelithiasis. 6. Bilateral sacroiliitis  Pt now transitioning to  comfort care/DNR  Electronically Signed: D. Rowe Robert, PA-C 03/05/2019, 2:21 PM   I spent a total of 15 minutes at the the patient's bedside AND on the patient's hospital floor or unit, greater than 50% of which was counseling/coordinating care for abdominal abscess drain    Patient ID: Gina [name], female   DOB: 1960/03/22, 58 y.o.   MRN: [id number]

## 2019-03-05 NOTE — Progress Notes (Signed)
Discussed Pt's new Midline incision bleeding with [facility] NP Sharlet Salina. Surgery will be notified.  RN Will continue to monitor the patient very closely.

## 2019-03-05 NOTE — Progress Notes (Signed)
[name] aware FMLA paperwork has been faxed. She again states she is unable to come visit her mother until after work today, with plans of initiating FMLA starting tomorrow.   0933-Daughter called stating she received a call from [facility] confirming her mother has a bed and able to transfer today if appropriate. Daughter stating if patient is able to transfer there and be around until Christmas or after she would want that. She is tearful expressing her confusion and mixed emotions at this point. Support provided. I also spent time discussing her mother's current state and goal of comfort as she and her mother discussed. Daughter states she has a lot to think about but does understand. She will call with further questions and confirms plans to visit later this afternoon.   Alda Lea, AGPCNP-BC Palliative Medicine Team

## 2019-03-05 NOTE — TOC Transition Note (Signed)
Transition of Care [facility]) - CM/SW Discharge Note   Patient Details  Name: Gina [name] MRN: [id number] Date of Birth: [birth year]  Transition of Care [facility]) CM/SW Contact:  Servando Snare, LCSW Phone Number: 03/05/2019, 9:11 AM   Clinical Narrative:   Patient has a bed at [facility]. Attending notified via secure chat. RN please arrange transportation for 5 pm. Patient will report to room 5E05. Report number: [id number]    Final next level of care: Long Term Acute Care (LTAC) Barriers to Discharge: No Barriers Identified   Patient Goals and CMS Choice Patient states their goals for this hospitalization and ongoing recovery are:: Get well and go home.   Choice offered to / list presented to : NA  Discharge Placement                Patient to be transferred to facility by: [place] Name of family member notified: Beatrice Patient and family notified of of transfer: 03/05/19  Discharge Plan and Services                  DME Agency: NA       HH Arranged: NA HH Agency: NA        Social Determinants of Health (SDOH) Interventions     Readmission Risk Interventions Readmission Risk Prevention Plan 02/10/2019 11/07/2017  Post Dischage Appt - Patient refused  Medication Screening - Complete  Transportation Screening Complete Complete  PCP follow-up - Patient refused  PCP or Specialist Appt within 3-5 Days Not Complete -  Not Complete comments DC date unknown- pt established with PCP -  [place] or [place] Complete -  Social Work Consult for [place] Planning/Counseling Not Complete -  SW consult not completed comments awaiting call back from daughter and APS -  Palliative Care Screening Not Complete -  Palliative Care Screening Not Complete Comments pending need -  Medication Review ([place]) Referral to Pharmacy -  Some recent data might be hidden

## 2019-03-05 NOTE — Progress Notes (Signed)
Palliative Medicine RN Note: FMLA paperwork completed and faxed to [phone] per daughter [name]'s ([name]'s) request.  Marjie Skiff. Sandia Pfund, RN, BSN, [facility] Palliative Medicine Team 03/05/2019 9:36 AM Office [phone]

## 2019-03-05 NOTE — Progress Notes (Signed)
PALLIATIVE NOTE:  Chart reviewed and updates provided by RN. Patient awake and alert. I discussed with patient concerns with drop in hemoglobin, the need for transfusion, and concerns with poor prognosis. We discussed goals of care discussion on yesterday and patient continues to express she is hopeful she can "wait until after Christmas" before she passed away but also acknowledges God's timing. Support provided. Explained to patient concerns given changes and noticeable bleeding at abdominal sites. She verbalized understanding and awareness of no surgical interventions that can correct her multiple concerns.   I discussed with patient her current full code status with consideration to her current illness and acknowledging patient's wishes yesterday to further meet with daughter before making any changes such as DNR. We discussed concerns for sudden arrest and knowing patient would not want life-prolonging measures. She is aware if she remains a full code medical staff is required to perform heroic measures. Patient closed eyes and became tearful expressing she would not want that. I offered to contact her daughter via phone with hopes she could come in this morning for discussion and to discuss code status in the interim. Patient verbalized understanding and agreement.   I was able to call and speak with daughter, Gina [name]. Updates provided. I encouraged daughter to come visit patient this morning with concerns of her decreased hemoglobin and poor prognosis. Daughter tearful expressing she is unable to leave work at this time. She acknowledges that her employer has faxed FMLA forms to my office fax number. Fax has been received and Palliative RN working to complete these on my behalf. Daughter aware forms will be completed by 10am this morning. I offered to fax a letter to her employer in the interim requesting daughter be allowed to leave and be at her mother's bedside immediately, however Gina [name] declined and  expressed she did not think it would make a difference. She verbalized she is unable to leave work until after 3pm and requested a call if anything changed.   She is tearful in discussion and again expressed her hopes that her mother could hold on until Christmas however she is becoming at peace with her mother wanting to be comfortable. Gina [name] is tearful requesting assistance in telling her mother she is ok with her decision and she is ok with her letting go and going to heaven. Therapeutic listening and support provided. I discussed with daughter patient's full code status with consideration to her current illness and recommendations for DNR. Daughter verbalized understanding and confirmed agreement with DNR/DNI. She is requesting to continue treatment until she is able to be with her mom and make final decisions, with awareness patient could have a sudden event at any time. Daughter requesting to be notified with any urgent changes. Again confirmed DNR/DNI request and daughter aware status will be changed.   I followed up with patient post phone call. Support given. Patient confirmed wishes for DNR/DNI. She is aware her daughter will be coming up to continue discussions with her later this afternoon after 3pm unless other changes noted.   Assessment -awake, alert, chronically-ill, frail appearing - RRR -diminished bilaterally -A&O x3  Plan -DNR/DNI-as requested by patient/daughter -Daughter planning to come up this afternoon (3pm or later) to continue [place] discussion (leaning towards comfort).  -FMLA paperwork received for daughter. Will complete -PMT will continue to support and follow   Total Time: 45 min.   Greater than 50%  of this time was spent counseling and coordinating care related to the above assessment and  plan.   Alda Lea, AGPCNP-BC Palliative Medicine Team

## 2019-03-05 NOTE — Progress Notes (Signed)
Patient ID: Gina [name], female   DOB: Dec 07, 1960, 58 y.o.   MRN: [id number]    31 Days Post-Op  Subjective: Patient has decided on DNR and comfort care.  Patient is having further bleeding from her drains as well as now out of her midline wound, which is likely decompression from fluid intraperitoneally.  Complaining of some abdominal pain.    ROS: See above, otherwise other systems negative  Objective: Vital signs in last 24 hours: Temp:  [97.5 F (36.4 C)-98.1 F (36.7 C)] 97.8 F (36.6 C) (12/23 0827) Pulse Rate:  [75-107] 94 (12/23 0827) Resp:  [9-21] 15 (12/23 0827) BP: (97-129)/(46-86) 119/60 (12/23 0827) SpO2:  [100 %] 100 % (12/23 0608) Last BM Date: 03/03/19  Intake/Output from previous day: 12/22 0701 - 12/23 0700 In: 3711.8 [I.V.:2250; IV Piggyback:1456.9] Out: 2225 [Urine:1200; Emesis/NG output:600; Drains:425] Intake/Output this shift: No intake/output data recorded.  PE: Abd: soft, mild discomfort.  Midline wound with base stained dark brown from bleeding vs decompression of some ventral abdominal fluid collection.  Old bloody drainage mixed with bilious output in both drains, more so in surgical drain.  NGT with bilious output, no blood noted in that cannister.  Lab Results:  Recent Labs    03/04/19 0210 03/05/19 0207  WBC 14.5* 13.2*  HGB 7.6* 6.2*  HCT 25.8* 20.8*  PLT 557* 450*   BMET Recent Labs    03/03/19 0201 03/05/19 0207  NA 143 139  K 4.4 4.9  CL 111 110  CO2 25 21*  GLUCOSE 133* 111*  BUN 44* 60*  CREATININE 0.54 0.85  CALCIUM 9.2 8.5*   PT/INR No results for input(s): LABPROT, INR in the last 72 hours. CMP     Component Value Date/Time   NA 139 03/05/2019 0207   NA 139 10/04/2017 1705   K 4.9 03/05/2019 0207   CL 110 03/05/2019 0207   CO2 21 (L) 03/05/2019 0207   GLUCOSE 111 (H) 03/05/2019 0207   BUN 60 (H) 03/05/2019 0207   BUN 11 10/04/2017 1705   CREATININE 0.85 03/05/2019 0207   CREATININE 0.68 04/28/2016 1640   CALCIUM 8.5 (L) 03/05/2019 0207   PROT 6.9 03/03/2019 0201   PROT 7.1 10/04/2017 1705   ALBUMIN 1.5 (L) 03/03/2019 0201   ALBUMIN 3.7 10/04/2017 1705   AST 35 03/03/2019 0201   ALT 33 03/03/2019 0201   ALKPHOS 369 (H) 03/03/2019 0201   BILITOT 0.6 03/03/2019 0201   BILITOT <0.2 10/04/2017 1705   GFRNONAA >60 03/05/2019 0207   GFRNONAA >89 04/28/2016 1640   GFRAA >60 03/05/2019 0207   GFRAA >89 04/28/2016 1640   Lipase     Component Value Date/Time   LIPASE 27 04/01/2016 1040       Studies/Results: DG Chest Port 1 View  Result Date: 03/04/2019 CLINICAL DATA:  Right-sided thoracentesis. EXAM: PORTABLE CHEST 1 VIEW COMPARISON:  CTA of the abdomen and pelvis 03/03/2019 FINDINGS: The heart size is normal. Previously noted right pleural effusion significantly reduced. There is no pneumothorax. Heart size is normal. Small left effusion remains. Right-sided PICC line is in place. NG tube terminates in the stomach. IMPRESSION: 1. Marked decrease in right pleural effusion. 2. Small left pleural effusion. 3. No pneumothorax. 4. The support apparatus is stable. Electronically Signed   By: San Morelle M.D.   On: 03/04/2019 13:40   ECHOCARDIOGRAM COMPLETE  Result Date: 03/04/2019   ECHOCARDIOGRAM REPORT   Patient Name:   Gina [name] Date of Exam: 03/04/2019 Medical Rec #:  [id number]       Height:       62.0 in Accession #:    [id number]      Weight:       117.5 lb Date of Birth:  Dec 09, 1960        BSA:          1.52 m Patient Age:    27 years        BP:           119/57 mmHg Patient Gender: F               HR:           85 bpm. Exam Location:  Inpatient Procedure: 2D Echo, Cardiac Doppler and Color Doppler Indications:    Congestive Heart Failure 428.0  History:        Patient has no prior history of Echocardiogram examinations.                 Risk Factors:Hypertension, Diabetes and Non-Smoker. Septic                 shock.  Sonographer:    Paulita Fujita RDCS Referring Phys: [id number]  AMRIT ADHIKARI IMPRESSIONS  1. Left ventricular ejection fraction, by visual estimation, is 60 to 65%. The left ventricle has normal function. There is no left ventricular hypertrophy.  2. The left ventricle demonstrates regional wall motion abnormalities.  3. Global right ventricle has normal systolic function.The right ventricular size is normal. No increase in right ventricular wall thickness.  4. Left atrial size was normal.  5. Right atrial size was normal.  6. The mitral valve is normal in structure. No evidence of mitral valve regurgitation. No evidence of mitral stenosis.  7. The tricuspid valve is normal in structure. Tricuspid valve regurgitation is not demonstrated.  8. The aortic valve is normal in structure. Aortic valve regurgitation is not visualized. No evidence of aortic valve sclerosis or stenosis.  9. The pulmonic valve was normal in structure. Pulmonic valve regurgitation is not visualized. 10. TR signal is inadequate for assessing pulmonary artery systolic pressure. 11. The inferior vena cava is normal in size with greater than 50% respiratory variability, suggesting right atrial pressure of 3 mmHg. FINDINGS  Left Ventricle: Left ventricular ejection fraction, by visual estimation, is 60 to 65%. The left ventricle has normal function. The left ventricle demonstrates regional wall motion abnormalities. The left ventricular internal cavity size was the left ventricle is normal in size. There is no left ventricular hypertrophy. Left ventricular diastolic parameters were normal. Normal left atrial pressure. Right Ventricle: The right ventricular size is normal. No increase in right ventricular wall thickness. Global RV systolic function is has normal systolic function. Left Atrium: Left atrial size was normal in size. Right Atrium: Right atrial size was normal in size Pericardium: There is no evidence of pericardial effusion. Mitral Valve: The mitral valve is normal in structure. No evidence of  mitral valve regurgitation. No evidence of mitral valve stenosis by observation. Tricuspid Valve: The tricuspid valve is normal in structure. Tricuspid valve regurgitation is not demonstrated. Aortic Valve: The aortic valve is normal in structure. Aortic valve regurgitation is not visualized. The aortic valve is structurally normal, with no evidence of sclerosis or stenosis. Pulmonic Valve: The pulmonic valve was normal in structure. Pulmonic valve regurgitation is not visualized. Pulmonic regurgitation is not visualized. Aorta: The aortic root, ascending aorta and aortic arch are all structurally normal, with no evidence of dilitation or obstruction.  Venous: The inferior vena cava is normal in size with greater than 50% respiratory variability, suggesting right atrial pressure of 3 mmHg. IAS/Shunts: No atrial level shunt detected by color flow Doppler. There is no evidence of a patent foramen ovale. No ventricular septal defect is seen or detected. There is no evidence of an atrial septal defect.  LEFT VENTRICLE PLAX 2D LVIDd:         4.40 cm  Diastology LVIDs:         3.20 cm  LV e' lateral:   11.50 cm/s LV PW:         0.80 cm  LV E/e' lateral: 6.6 LV IVS:        0.80 cm  LV e' medial:    7.94 cm/s LVOT diam:     1.70 cm  LV E/e' medial:  9.6 LV SV:         47 ml LV SV Index:   30.55 LVOT Area:     2.27 cm  RIGHT VENTRICLE RV S prime:     12.30 cm/s TAPSE (M-mode): 2.0 cm LEFT ATRIUM             Index       RIGHT ATRIUM          Index LA diam:        2.70 cm 1.77 cm/m  RA Area:     9.64 cm LA Vol (A2C):   22.4 ml 14.69 ml/m RA Volume:   16.40 ml 10.75 ml/m LA Vol (A4C):   20.3 ml 13.31 ml/m LA Biplane Vol: 21.6 ml 14.16 ml/m  AORTIC VALVE LVOT Vmax:   118.00 cm/s LVOT Vmean:  80.800 cm/s LVOT VTI:    0.208 m  AORTA Ao Root diam: 2.70 cm MITRAL VALVE MV Area (PHT): 3.93 cm             SHUNTS MV PHT:        55.97 msec           Systemic VTI:  0.21 m MV Decel Time: 193 msec             Systemic Diam: 1.70 cm  MV E velocity: 76.30 cm/s 103 cm/s MV A velocity: 93.00 cm/s 70.3 cm/s MV E/A ratio:  0.82       1.5  Mihai Croitoru MD Electronically signed by Sanda Klein MD Signature Date/Time: 03/04/2019/4:31:24 PM    Final    CT Angio Abd/Pel w/ and/or w/o  Result Date: 03/03/2019 CLINICAL DATA:  Duodenal ulcer, post surgical repair with postop anterior peritoneal fluid collection, post pigtail drain catheter placement 02/26/2019. New hypotension and tachycardia. EXAM: CTA ABDOMEN AND PELVIS WITHOUT AND WITH CONTRAST TECHNIQUE: Multidetector CT imaging of the abdomen and pelvis was performed using the standard protocol during bolus administration of intravenous contrast. Multiplanar reconstructed images and MIPs were obtained and reviewed to evaluate the vascular anatomy. CONTRAST:  134mL OMNIPAQUE IOHEXOL 350 MG/ML SOLN COMPARISON:  02/25/2019 FINDINGS: VASCULAR Aorta: Mild scattered calcified plaque. No aneurysm, dissection, or stenosis. Celiac: Patent without evidence of aneurysm, dissection, vasculitis or significant stenosis. Left hepatic artery arises from left gastric artery, an anatomic variant. SMA: Patent without evidence of aneurysm, dissection, vasculitis or significant stenosis. Renals: Single left, widely patent. Duplicated right, superior dominant, both patent. IMA: Patent without evidence of aneurysm, dissection, vasculitis or significant stenosis. Inflow: Patent without evidence of aneurysm, dissection, vasculitis or significant stenosis. Proximal Outflow: Bilateral common femoral and visualized portions of the superficial and profunda femoral  arteries are patent without evidence of aneurysm, dissection, vasculitis or significant stenosis. Veins: Iliac venous system unremarkable. IVC is nondistended. Patent bilateral renal veins. Patent SMV, splenic vein, portal vein. Hepatic veins patent. Review of the MIP images confirms the above findings. NON-VASCULAR Lower chest: Some increase in moderate right  pleural effusion. Extensive consolidation/atelectasis in the visualized right lower lobe. Patchy subsegmental atelectasis/consolidation/scarring posteriorly at the left lung base. Hepatobiliary: 1.4 cm partially calcified stone in the nondilated gallbladder. No focal liver lesion or biliary ductal dilatation. Pancreas: Unremarkable. No pancreatic ductal dilatation or surrounding inflammatory changes. Spleen: Normal in size without focal abnormality. Adrenals/Urinary Tract: Adrenal glands unremarkable. Normal bilateral renal enhancement. No hydronephrosis. Urinary bladder is distended. Stomach/Bowel: No evidence of active extravasation. Nasogastric tube extends into decompressed stomach. There is fluid distention of the proximal duodenum. Multiple fluid distended small bowel loops. There is a small amount of fluid in the cecum and proximal ascending colon, the remainder of the colon virtually completely decompressed. Lymphatic: Left para-aortic nodes up to 0.9 cm short axis diameter. Prominent 8 mm distal right external iliac node. No definite mesenteric adenopathy. Reproductive: Uterus and bilateral adnexa are unremarkable. Other: Little overall change in simple appearing fluid collection lateral and inferior to the greater curvature of the stomach. Right infrahepatic surgical drain without significant adjacent fluid. Complex loculated peripherally enhancing collection in the anterior peritoneal cavity has increased in size now measuring 15.8 x 3.9 cm maximum transverse dimensions, previously 11.7 x 1.7 cm by my measurement. This contains mixed attenuation material and some scattered gas bubbles. Percutaneous drain catheter appears adequately positioned at the inferior margin of this collection. Small loculated fluid collection posterior to the uterus is stable. No definite new fluid collections. No free air. Musculoskeletal: No fracture or worrisome bone lesion. Bilateral sacroiliitis. IMPRESSION: 1. No active GI  bleed or other acute vascular findings. 2. Interval increase in size of large complex anterior peritoneal fluid collection, despite percutaneous drain catheter at the inferior margin. 3. Persistent fluid distended small bowel loops, possibly secondary to ileus. 4. Increasing moderate right pleural effusion with extensive right lower lobe consolidation/atelectasis. 5. Cholelithiasis. 6. Bilateral sacroiliitis. Electronically Signed   By: Lucrezia Europe M.D.   On: 03/03/2019 17:00   US THORACENTESIS ASP PLEURAL SPACE W/IMG GUIDE  Result Date: 03/04/2019 INDICATION: Patient with history of septic shock due to ruptured gastric ulcer with peritonitis, abdominal abscess, right pleural effusion; request received for diagnostic and therapeutic right thoracentesis. EXAM: ULTRASOUND GUIDED DIAGNOSTIC AND THERAPEUTIC RIGHT THORACENTESIS MEDICATIONS: None COMPLICATIONS: None immediate. PROCEDURE: An ultrasound guided thoracentesis was thoroughly discussed with the patient's daughter and questions answered. The benefits, risks, alternatives and complications were also discussed. The patient understands and wishes to proceed with the procedure. Written consent was obtained. Ultrasound was performed to localize and mark an adequate pocket of fluid in the right chest. The area was then prepped and draped in the normal sterile fashion. 1% Lidocaine was used for local anesthesia. Under ultrasound guidance a 6 Fr Safe-T-Centesis catheter was introduced. Thoracentesis was performed. The catheter was removed and a dressing applied. FINDINGS: A total of approximately 710 cc of yellow fluid was removed. Samples were sent to the laboratory as requested by the clinical team. IMPRESSION: Successful ultrasound guided diagnostic and therapeutic right thoracentesis yielding 710 cc of pleural fluid. Read by: Rowe Robert, PA-C Electronically Signed   By: Jacqulynn Cadet M.D.   On: 03/04/2019 12:43    Anti-infectives: Anti-infectives (From  admission, onward)   Start  Dose/Rate Route Frequency Ordered Stop   03/03/19 1600  sulfamethoxazole-trimethoprim (BACTRIM) 200 mg of trimethoprim in dextrose 5 % 250 mL IVPB     200 mg of trimethoprim 262.5 mL/hr over 60 Minutes Intravenous Every 6 hours 03/03/19 1457 03/17/19 2359   03/01/19 1800  Ampicillin-Sulbactam (UNASYN) 3 g in sodium chloride 0.9 % 100 mL IVPB     3 g 200 mL/hr over 30 Minutes Intravenous Every 8 hours 03/01/19 1304 03/17/19 2359   02/28/19 1600  anidulafungin (ERAXIS) 100 mg in sodium chloride 0.9 % 100 mL IVPB     100 mg 78 mL/hr over 100 Minutes Intravenous Every 24 hours 02/27/19 1346 03/17/19 2359   02/27/19 1600  anidulafungin (ERAXIS) 200 mg in sodium chloride 0.9 % 200 mL IVPB     200 mg 78 mL/hr over 200 Minutes Intravenous  Once 02/27/19 1346 02/27/19 1840   02/18/19 1800  piperacillin-tazobactam (ZOSYN) IVPB 3.375 g  Status:  Discontinued     3.375 g 12.5 mL/hr over 240 Minutes Intravenous Every 8 hours 02/18/19 1652 03/01/19 1259   02/05/19 1000  anidulafungin (ERAXIS) 100 mg in sodium chloride 0.9 % 100 mL IVPB  Status:  Discontinued     100 mg 78 mL/hr over 100 Minutes Intravenous Every 24 hours 02/04/19 0840 02/25/19 1604   02/04/19 1000  anidulafungin (ERAXIS) 200 mg in sodium chloride 0.9 % 200 mL IVPB     200 mg 78 mL/hr over 200 Minutes Intravenous  Once 02/04/19 0840 02/04/19 1301   02/04/19 0200  fluconazole (DIFLUCAN) IVPB 200 mg  Status:  Discontinued     200 mg 100 mL/hr over 60 Minutes Intravenous Every 24 hours 02/02/19 2302 02/04/19 0840   02/03/19 1800  vancomycin (VANCOCIN) IVPB 750 mg/150 ml premix  Status:  Discontinued     750 mg 150 mL/hr over 60 Minutes Intravenous Every 24 hours 02/03/19 0241 02/03/19 0242   02/03/19 1800  vancomycin (VANCOCIN) IVPB 750 mg/150 ml premix  Status:  Discontinued     750 mg 150 mL/hr over 60 Minutes Intravenous Every 24 hours 02/03/19 0242 02/03/19 0858   02/03/19 0600   piperacillin-tazobactam (ZOSYN) IVPB 3.375 g     3.375 g 12.5 mL/hr over 240 Minutes Intravenous Every 8 hours 02/03/19 0243 02/17/19 0205   02/02/19 2315  fluconazole (DIFLUCAN) IVPB 400 mg     400 mg 100 mL/hr over 120 Minutes Intravenous STAT 02/02/19 2301 02/03/19 0218   02/02/19 1800  ceFEPIme (MAXIPIME) 2 g in sodium chloride 0.9 % 100 mL IVPB     2 g 200 mL/hr over 30 Minutes Intravenous  Once 02/02/19 1745 02/02/19 2030   02/02/19 1800  metroNIDAZOLE (FLAGYL) IVPB 500 mg     500 mg 100 mL/hr over 60 Minutes Intravenous  Once 02/02/19 1745 02/02/19 2237   02/02/19 1800  vancomycin (VANCOCIN) IVPB 1000 mg/200 mL premix  Status:  Discontinued     1,000 mg 200 mL/hr over 60 Minutes Intravenous  Once 02/02/19 1745 02/02/19 1747   02/02/19 1800  vancomycin (VANCOCIN) 1,250 mg in sodium chloride 0.9 % 250 mL IVPB     1,250 mg 166.7 mL/hr over 90 Minutes Intravenous  Once 02/02/19 1747 02/02/19 2237       Assessment/Plan VDRF- extubated 11/29 Poorly controlled DM2- A1c15.2 S/p bilateral BKA Ischemic changes to both LE- defer to medicineandDr. 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  POD31,S/pex lap with oversew of duodenal ulcer,with Graham patch 11/23,Dr. Lucia Gaskins, for perforated prepyloric ulcer with gross contamination -persistent leak noted on CT scan with several fluid collections in the ventral abdomen and in pelvis.cont IR drainand eraxis restarted due to cultures with yeast still present - BID wet to dry dressing changes to midline abdominal wound - BID PPI -patient is still bleeding/oozing from somewhere causing blood in her surgical drain, IR drain, and  coming from her midline wound.  This is dark and maroon appearing for the most part. -patient has decided on DNR status and would like to transition to comfort care. -this is totally reasonable and unfortunately, there is little that can be done for correct her multiple problems.   FEN -NPO/NGT VTE - NoSCDs,Bilateral BKa's - sq heparin, may want to stop given bleeding and transition to DNR/comfort ID - Eraxis 11/24>>restarted on 12/17-->Zosyn 11/23 >>12/6, 12/8 -->12/18, unasyn 12/18 --> Foley -out Follow up -Dr. Lucia Gaskins as needed given transition to comfort care   LOS: 31 days    Henreitta Cea , [facility] Surgery 03/05/2019, 8:36 AM Please see Amion for pager number during day hours 7:00am-4:30pm

## 2019-03-05 NOTE — Progress Notes (Signed)
PHARMACY - TOTAL PARENTERAL NUTRITION CONSULT NOTE   Indication: Small bowel obstruction  Patient Measurements: Height: 5' 2"  (157.5 cm) Weight: 117 lb 8.1 oz (53.3 kg) IBW/kg (Calculated) : 50.1 TPN AdjBW (KG): 57.9 Body mass index is 21.49 kg/m. Usual Weight: 55-60 kg Ideal Body Weight:  44.2 kg(Adjusted IBW for bilateral BKA)  Assessment:  53 yoF with poorly controlled DM s/p BL BKA, admitted 11/23 with AMS, n/v, abdominal pain. CT abdomen showed pneumoperitoneum and mesenteric ischemia. Underwent ex lap on 11/23 with finding of perforated prepyloric ulcer which was oversewn, along with gross contamination of the peritoneum. Hospital course complicated by DKA, AKI, acute thrombocytopenia, and C. glabrata candidemia. Patient remains with high NGT d/t bowel edema as demonstrated on UGI. TPN started on 11/30 but was stopped shortly thereafter to facilitate central line holiday for candidemia. Repeat blood cultures remain negative to date, and TPN now restarting with replacement of central line. Patient at risk for refeeding given prolonged lack of nutrition.  Glucose / Insulin: Hx DM; prev required D10 W infusion to maintain euglycemia  Current resistant Novolog scale q6h (12/5)   CBGs ( goal <150)  range 111-151, 10 units of Novolog  past 24 hrs, 35 units of regular insulin added to TPN  Electrolytes: WNL, corrected Ca elevated but trending down with removal of Ca from TPN- 10.5 (12/23) Renal: AKI on admit, SCr now WNL  LFTs / TGs: WNL except Alk phos up to 369, Trig 153 (12/21) Prealbumin / albumin: both low (11/30), 5.2 (12/4). 5.3 ( 12/7)  7.7 (12/14), 12.7 (12/21) I/O: 975 ml from JP drain,  850 ml UOP,  330 ml NGO,  MIVF: NS at 10 ml/hr GI Imaging: 11/30 UGI: high NG output & bowel edema 12/15 CTpersistent leak noted on CT scan with several fluid collections in the ventral abdomen and in pelvis.  Surgeries / Procedures: 11/22 ex lap: oversew of duodenal ulcer with [name]  patch 12/16 IR placed drain into ant abd collection  Central access: PICC replaced 12/3  TPN start date: resumed 12/3 (~ 2200)  Nutritional Goals (per RD recommendation on 12/11): Kcal:  2025-2330 kcal Protein:  115-130 grams Fluid:  >/= 2 L/day Goal TPN rate is 90 mL/hr (provides 129 g of protein and 2047 kcals per day) - GIR 3.3 mg/kg/min Current Nutrition:  NPO  Plan: at 1800  Continue custom TPN at 90 mL/hr (goal rate)   Electrolytes in TPN:  32mq/L Na, 25 mEq/L K, no Ca, Magnesium 10 mEq/L , and 10 mmol/L of Phos ; continue Cl:Ac ratio 1:2  Standard MVI TPN on MWF only d/t nProducer, television/film/video Daily trace elements  Continue resistant SSI with q6h CBG checks and adjust as needed   Continue Novolog 35 units in TPN  Continue NS at [facility] on Mon/Thurs  MNetta Cedars PharmD, BCPS 03/05/2019 8:01 AM

## 2019-03-05 NOTE — Progress Notes (Signed)
[place] [facility]   Reason for visit: Follow up on perforated gastric ulcer and C glabrata   Interval History:  WBC down to 13.2, remains afebrile.  Does not respond much Day 32 total antibiotics Day 4 amp/sulbactam Day 30 anidulafungin  Physical Exam: Constitutional:  Vitals:   03/05/19 1200 03/05/19 1346  BP: (!) 123/55 139/65  Pulse: 96   Resp: 16   Temp:  99 F (37.2 C)  SpO2:     patient appears in NAD, awake alert Eyes: anicteric HENT: +NGT Respiratory: Normal respiratory effort; CTA B Cardiovascular: RRR  Review of Systems: Constitutional: negative for chills, no fever Denies any pain Abd: no nausea  Lab Results  Component Value Date   WBC 13.2 (H) 03/05/2019   HGB 6.2 (LL) 03/05/2019   HCT 20.8 (L) 03/05/2019   MCV 96.3 03/05/2019   PLT 450 (H) 03/05/2019    Lab Results  Component Value Date   CREATININE 0.85 03/05/2019   BUN 60 (H) 03/05/2019   NA 139 03/05/2019   K 4.9 03/05/2019   CL 110 03/05/2019   CO2 21 (L) 03/05/2019    Lab Results  Component Value Date   ALT 33 03/03/2019   AST 35 03/03/2019   ALKPHOS 369 (H) 03/03/2019     Microbiology: Recent Results (from the past 240 hour(s))  Aerobic/Anaerobic Culture (surgical/deep wound)     Status: None   Collection Time: 02/26/19  3:31 PM   Specimen: Abscess  Result Value Ref Range Status   Specimen Description   Final    ABSCESS LLQ CT DRAIN CATHETER Performed at [facility], [address] [place], [place] [address]    Special Requests   Final    Normal Performed at [facility], [place] [address] [place], [place] [address]    Gram Stain   Final    MODERATE WBC PRESENT,BOTH PMN AND MONONUCLEAR FEW YEAST    Culture   Final    FEW ENTEROCOCCUS FAECALIS FEW STENOTROPHOMONAS MALTOPHILIA MODERATE CANDIDA GLABRATA NO ANAEROBES ISOLATED Performed at [facility], [place] [address] [place], [place] [address]    Report Status 03/03/2019 FINAL  Final   Organism ID, Bacteria ENTEROCOCCUS FAECALIS  Final   Organism ID, Bacteria STENOTROPHOMONAS MALTOPHILIA  Final      Susceptibility   Enterococcus faecalis - MIC*    AMPICILLIN <=2 SENSITIVE Sensitive     VANCOMYCIN >=32 RESISTANT Resistant     GENTAMICIN SYNERGY SENSITIVE Sensitive     LINEZOLID 2 SENSITIVE Sensitive     * FEW ENTEROCOCCUS FAECALIS   Stenotrophomonas maltophilia - MIC*    LEVOFLOXACIN >=8 RESISTANT Resistant     TRIMETH/SULFA <=20 SENSITIVE Sensitive     * FEW STENOTROPHOMONAS MALTOPHILIA  Body fluid culture     Status: None (Preliminary result)   Collection Time: 03/04/19 12:54 PM   Specimen: Lung, Right; Pleural Fluid  Result Value Ref Range Status   Specimen Description   Final    PLEURAL RIGHT Performed at [facility] [address] [place], [place] [address]    Special Requests   Final    NONE Performed at [facility], [place] [address] [place], [place] [address]    Gram Stain   Final    RARE WBC PRESENT,BOTH PMN AND MONONUCLEAR NO ORGANISMS SEEN    Culture   Final    NO GROWTH < 24 HOURS Performed at [facility],  [address], [place], [place] [address]    Report Status PENDING  Incomplete    Impression/Plan:  1. Intra abdominal infection - still with abscess area.  On antibiotics  2. dispo - I have noted the patient is going for comfort measures.  I do not feel antibiotics will aid in comfort so from an ID perspective can be stopped if she is full comfort.  I will defer to palliative for this.  I will sign off at this point

## 2019-03-05 NOTE — TOC Progression Note (Signed)
Transition of Care [facility]) - Progression Note    Patient Details  Name: Gina [name] MRN: [id number] Date of Birth: 1960/04/04  Transition of Care [facility] CM/SW Contact  Servando Snare, LCSW Phone Number: 03/05/2019, 9:31 AM  Clinical Narrative:   Patient is not stable for dc. Notes report patient wants comfort care. Please notify [facility] staff is patient requires hospice. LCSW notified Doroteo Bradford at [facility] that patient will not dc today.       Barriers to Discharge: No Barriers Identified  Expected Discharge Plan and Services                             DME Agency: NA       HH Arranged: NA [place] Agency: NA         Social Determinants of Health (SDOH) Interventions    Readmission Risk Interventions Readmission Risk Prevention Plan 02/10/2019 11/07/2017  Post Dischage Appt - Patient refused  Medication Screening - Complete  Transportation Screening Complete Complete  PCP follow-up - Patient refused  PCP or Specialist Appt within 3-5 Days Not Complete -  Not Complete comments DC date unknown- pt established with PCP -  [place] or Home Care Consult Complete -  Social Work Consult for [place] Planning/Counseling Not Complete -  SW consult not completed comments awaiting call back from daughter and APS -  Palliative Care Screening Not Complete -  Palliative Care Screening Not Complete Comments pending need -  Medication Review ([place]) Referral to Pharmacy -  Some recent data might be hidden

## 2019-03-05 NOTE — Progress Notes (Signed)
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  She was found to have perforated gastric ulcer with pneumoperitoneum, also had evidence DKA.  She was intubated and admitted to ICU.  Started on broad-spectrum antibiotics, pressors, IV fluids.  She underwent expiratory laparotomy 11/23 with finding of perforated prepyloric ulcer which was repaired, contaminated peritoneal fluid was drained.  She was extubated on 11/29.  She had extensive complicated hospital course with DKA, AKI, acute blood loss anemia, thrombocytopenia, intra-abdominal infection, bacteremia, fungemia.    General surgery closely following.  Currently on IV antibiotics.  Abdominal CT done on 12/15 showed persistent abdominal collection,leak.  IR placed left lower quadrant anterior peritoneal drain catheter on 02/26/19.Restarted on eraxis after aerobic/anaerobic culture showed yeast. On 03/03/19, she had multiple bloody bowel movements including bloody output from the JP drain.  GI consulted.  CT angiogram of the abdomen did not show any active bleeding but showed persistent large complex anterior peritoneal fluid collection.  12/23: This morning she was found to be oozing from her midline surgical wound.  JP catheter also draining dark fluid.  Palliative care following.  Patient made DNR.  We recommend comfort care/hospice.  She is not stable for discharge to [facility].  She has been transfused units of PRBC today because her hemoglobin has dropped to 6.  She looks very drowsy, lethargic this morning.   Assessment & Plan:   Principal Problem:   Septic shock ([place]) Active Problems:   DKA  (diabetic ketoacidoses) ([place])   Severe protein-calorie malnutrition ([place])   Acute kidney injury ([place])   Pressure injury of skin   Perforated gastric ulcer ([place])   Acute respiratory failure ([place])   Duodenal perforation (H[facility])   Below-knee amputee ([place])   Atherosclerosis of native arteries of extremities with gangrene, right leg (HCC)   Atherosclerosis of native arteries of extremities with gangrene, left leg (HCC)   Septic shock due to ruptured gastric ulcer with acute peritonitis, fungemia, UTI: S/pex lap with oversew of duodenal ulcer,with Graham patch 11/23,DrLucia Gaskins, for perforated prepyloric ulcer with gross contamination.General surgery following. ID was consulted and following. Blood cultures had shown Candida glabrata and she completed the course for fungemia.  Urine culture showed Klebsiella pneumoniae.  Peritoneal fluid culture showed Candida albicans. No evidence of fungal endophthalmitis after ophthalmology consultation.. CT abdomen done on 12/15 and 12/21 showed persistent large  fluid collection on the anterior abdomen,possible leak.IR placed left lower quadrant anterior peritoneal drain catheter on 12/16.[name] Aerobic/anaerobic culture now again showed candida glabrata Enterococcus faecalis, stenotrophomonas.  Restarted on Eraxis.Currently on unasyn and bactrim .She has a RLQ JP drain with suction and midline surgical wound.Has NG tube  Concern for GI bleed: On 12/21 she had multiple bloody bowel movements and there was output of bloody content in the JP drain.  GI consulted.  CT angio did not show any active bleeding.  H&H currently stable.  Static bleeding from her surgical wound this morning.  Hemoglobin dropped to 6.  Being transfused with 2 units of PRBC.  Moderate right pleural effusion: Atelectasis of the right lower lobe ith  decreased air entry on the right side.  Underwent thoracentesis on 12/22 with removal of 700 mL of  fluid  Ischemic changes at bilateral BKA site:  Possibly secondary to severe septic shock.  She was seen by vascular surgery, orthopedics.  She will eventually need above-knee amputation when she is more stable.  Patient declined surgery at this time so continue current medical treatment  Acute kidney injury: Back to baseline now  ICU delirium: Secondary to infectious etiology, hospital admission, ICU environment.  Mental status has improved and she is currently alert and oriented.  DKA with severely uncontrolled hyperglycemia: Resolved.  Hemoglobin A1c of 15.2 as per 11/23.  Continue current insulin regimen  Severe protein calorie malnutrition with hypoalbuminemia: Started on TPN.   Dietitian following  Anemia due to acute blood loss, critical illness:  Total of 5 units so far this hospitalization.  Continue to monitor CBC  Acute thrombocytopenia: Secondary to septic shock.  Resolved  Status post bilateral BKA, left phalanx amputation: Hand surgery and orthopedics were following.  Hep C: Hep C RNA pending  Debility/deconditioning/poor prognosis: Extremely sick patient.  Completed hospital course.  Palliative  care was consulted for goals of care discussion.  DNR now.  We recommend comfort care/hospice   Pressure Injury 12/26/18 Buttocks Left Stage II -  Partial thickness loss of dermis presenting as a shallow open ulcer with a red, pink wound bed without slough. (Active)  12/26/18 0630  Location: Buttocks  Location Orientation: Left  Staging: Stage II -  Partial thickness loss of dermis presenting as a shallow open ulcer with a red, pink wound bed without slough.  Wound Description (Comments):   Present on Admission: Yes     Pressure Injury 12/26/18 Buttocks Right;Lower Stage II -  Partial thickness loss of dermis presenting as a shallow open ulcer with a red, pink wound bed without slough. (Active)  12/26/18 0630  Location: Buttocks  Location Orientation: Right;Lower  Staging: Stage II -  Partial thickness loss of dermis  presenting as a shallow open ulcer with a red, pink wound bed without slough.  Wound Description (Comments):   Present on Admission: Yes     Pressure Injury 02/03/19 Coccyx Right;Left;Medial Unstageable - Full thickness tissue loss in which the base of the ulcer is covered by slough (yellow, tan, gray, green or brown) and/or eschar (tan, brown or black) in the wound bed. pt has 3 small injuri (Active)  02/03/19 0230  Location: Coccyx  Location Orientation: Right;Left;Medial  Staging: Unstageable - Full thickness tissue loss in which the base of the ulcer is covered by slough (yellow, tan, gray, green or brown) and/or eschar (tan, brown or black) in the wound bed.  Wound Description (Comments): pt has 3 small injuries all in the same area, one is medial and two are on either side of it  Present on Admission: Yes           Nutrition Problem: Inadequate oral intake Etiology: altered GI function(pyloric channel ulcer perforation s/p ex lap)      DVT prophylaxis:Heparin [place] Code Status: Full Family Communication: Discussed with daughter on phone on 03/04/19 Disposition Plan: She has a bed in [facility].  Not stable for discharge today.  Recommend hospice/comfort care .  Consultants: General surgery, PCCM, ID  Procedures: Exploratory laparotomy  Antimicrobials:  Anti-infectives (From admission, onward)   Start     Dose/Rate Route Frequency Ordered Stop   03/03/19 1600  sulfamethoxazole-trimethoprim (BACTRIM) 200 mg of trimethoprim in dextrose 5 % 250 mL IVPB     200 mg of trimethoprim 262.5 mL/hr over 60 Minutes Intravenous Every 6 hours  03/03/19 1457 03/17/19 2359   03/01/19 1800  Ampicillin-Sulbactam (UNASYN) 3 g in sodium chloride 0.9 % 100 mL IVPB     3 g 200 mL/hr over 30 Minutes Intravenous Every 8 hours 03/01/19 1304 03/17/19 2359   02/28/19 1600  anidulafungin (ERAXIS) 100 mg in sodium chloride 0.9 % 100 mL IVPB     100 mg 78 mL/hr over 100 Minutes Intravenous Every 24  hours 02/27/19 1346 03/17/19 2359   02/27/19 1600  anidulafungin (ERAXIS) 200 mg in sodium chloride 0.9 % 200 mL IVPB     200 mg 78 mL/hr over 200 Minutes Intravenous  Once 02/27/19 1346 02/27/19 1840   02/18/19 1800  piperacillin-tazobactam (ZOSYN) IVPB 3.375 g  Status:  Discontinued     3.375 g 12.5 mL/hr over 240 Minutes Intravenous Every 8 hours 02/18/19 1652 03/01/19 1259   02/05/19 1000  anidulafungin (ERAXIS) 100 mg in sodium chloride 0.9 % 100 mL IVPB  Status:  Discontinued     100 mg 78 mL/hr over 100 Minutes Intravenous Every 24 hours 02/04/19 0840 02/25/19 1604   02/04/19 1000  anidulafungin (ERAXIS) 200 mg in sodium chloride 0.9 % 200 mL IVPB     200 mg 78 mL/hr over 200 Minutes Intravenous  Once 02/04/19 0840 02/04/19 1301   02/04/19 0200  fluconazole (DIFLUCAN) IVPB 200 mg  Status:  Discontinued     200 mg 100 mL/hr over 60 Minutes Intravenous Every 24 hours 02/02/19 2302 02/04/19 0840   02/03/19 1800  vancomycin (VANCOCIN) IVPB 750 mg/150 ml premix  Status:  Discontinued     750 mg 150 mL/hr over 60 Minutes Intravenous Every 24 hours 02/03/19 0241 02/03/19 0242   02/03/19 1800  vancomycin (VANCOCIN) IVPB 750 mg/150 ml premix  Status:  Discontinued     750 mg 150 mL/hr over 60 Minutes Intravenous Every 24 hours 02/03/19 0242 02/03/19 0858   02/03/19 0600  piperacillin-tazobactam (ZOSYN) IVPB 3.375 g     3.375 g 12.5 mL/hr over 240 Minutes Intravenous Every 8 hours 02/03/19 0243 02/17/19 0205   02/02/19 2315  fluconazole (DIFLUCAN) IVPB 400 mg     400 mg 100 mL/hr over 120 Minutes Intravenous STAT 02/02/19 2301 02/03/19 0218   02/02/19 1800  ceFEPIme (MAXIPIME) 2 g in sodium chloride 0.9 % 100 mL IVPB     2 g 200 mL/hr over 30 Minutes Intravenous  Once 02/02/19 1745 02/02/19 2030   02/02/19 1800  metroNIDAZOLE (FLAGYL) IVPB 500 mg     500 mg 100 mL/hr over 60 Minutes Intravenous  Once 02/02/19 1745 02/02/19 2237   02/02/19 1800  vancomycin (VANCOCIN) IVPB 1000 mg/200 mL  premix  Status:  Discontinued     1,000 mg 200 mL/hr over 60 Minutes Intravenous  Once 02/02/19 1745 02/02/19 1747   02/02/19 1800  vancomycin (VANCOCIN) 1,250 mg in sodium chloride 0.9 % 250 mL IVPB     1,250 mg 166.7 mL/hr over 90 Minutes Intravenous  Once 02/02/19 1747 02/02/19 2237      Subjective: Patient seen and examined the bedside this morning.  Hemodynamically stable.  Not having active bloody bowel movement at present but her JP drain is draining dark fluid.  Complains of abdominal discomfort but not in distress.  Objective: Vitals:   03/05/19 0825 03/05/19 0827 03/05/19 1044 03/05/19 1045  BP: 119/60 119/60 107/68 107/68  Pulse: 94 94 92 96  Resp: _0 Temp:  97.8 F (36.6 C) 98.7 F (37.1 C)   TempSrc:  Oral Oral   SpO2:      Weight:      Height:        Intake/Output Summary (Last 24 hours) at 03/05/2019 1051 Last data filed at 03/05/2019 0600 Gross per 24 hour  Intake 3711.83 ml  Output 2225 ml  Net 1486.83 ml   Filed Weights   03/01/19 0500 03/02/19 0453 03/03/19 0500  Weight: 51.7 kg 51.7 kg 53.3 kg    Examination:  General exam: Extremely Deconditioned, debilitated, weak HEENT:PERRL, Ear/Nose normal on gross exam,NG tube Respiratory system: Decreased air entry on the right side Cardiovascular system: S1 & S2 heard, RRR. No JVD, murmurs, rubs, gallops or clicks.  Gastrointestinal system: Abdomen is mildly distended, soft ,mild generalized tenderness. Bowel sounds not heard . Midline abdominal surgical wound, right lower quadrant and left lower quadrant drains  Central nervous system: lethargic  extremities: Bilateral BKA.  Amputation site covered with dressing, amputation of left middle finger Skin: No rashes, lesions no icterus ,no pallor.  Unstageable sacral wound   Data Reviewed: I have personally reviewed following labs and imaging studies  CBC: Recent Labs  Lab 02/28/19 0148 03/03/19 0201 03/03/19 1300 03/04/19 0210  03/05/19 0207  WBC 9.4 9.2 16.1* 14.5* 13.2*  NEUTROABS 5.9 5.9  --   --  9.9*  HGB 9.5* 8.8* 9.0* 7.6* 6.2*  HCT 31.0* 29.5* 30.1* 25.8* 20.8*  MCV 95.1 95.8 97.7 98.9 96.3  PLT 651* 655* 598* 557* 366*   Basic Metabolic Panel: Recent Labs  Lab 02/27/19 0548 03/03/19 0201 03/05/19 0207  NA 139 143 139  K 4.8 4.4 4.9  CL 106 111 110  CO2 27 25 21*  GLUCOSE 150* 133* 111*  BUN 42* 44* 60*  CREATININE 0.66 0.54 0.85  CALCIUM 8.9 9.2 8.5*  MG 2.0 2.2  --   PHOS 3.5 3.5  --    GFR: Estimated Creatinine Clearance: 57.8 mL/min (by C-G formula based on SCr of 0.85 mg/dL). Liver Function Tests: Recent Labs  Lab 02/27/19 0548 03/03/19 0201  AST 25 35  ALT 23 33  ALKPHOS 275* 369*  BILITOT 0.3 0.6  PROT 6.7 6.9  ALBUMIN 1.2* 1.5*   No results for input(s): LIPASE, AMYLASE in the last 168 hours. No results for input(s): AMMONIA in the last 168 hours. Coagulation Profile: No results for input(s): INR, PROTIME in the last 168 hours. Cardiac Enzymes: No results for input(s): CKTOTAL, CKMB, CKMBINDEX, TROPONINI in the last 168 hours. BNP (last 3 results) No results for input(s): PROBNP in the last 8760 hours. HbA1C: No results for input(s): HGBA1C in the last 72 hours. CBG: Recent Labs  Lab 03/04/19 0531 03/04/19 1238 03/04/19 1759 03/04/19 2346 03/05/19 0606  GLUCAP 182* 210* 151* 144* 131*   Lipid Profile: Recent Labs    03/03/19 0201  TRIG 153*   Thyroid Function Tests: No results for input(s): TSH, T4TOTAL, FREET4, T3FREE, THYROIDAB in the last 72 hours. Anemia Panel: No results for input(s): VITAMINB12, FOLATE, FERRITIN, TIBC, IRON, RETICCTPCT in the last 72 hours. Sepsis Labs: No results for input(s): PROCALCITON, LATICACIDVEN in the last 168 hours.  Recent Results (from the past 240 hour(s))  Aerobic/Anaerobic Culture (surgical/deep wound)     Status: None   Collection Time: 02/26/19  3:31 PM   Specimen: Abscess  Result Value Ref Range Status    Specimen Description   Final    ABSCESS LLQ CT DRAIN CATHETER Performed at [facility], [address], [place], [place] [address]  Special Requests   Final    Normal Performed at [facility], [place] [address], [place], [place] [address]    Gram Stain   Final    MODERATE WBC PRESENT,BOTH PMN AND MONONUCLEAR FEW YEAST    Culture   Final    FEW ENTEROCOCCUS FAECALIS FEW STENOTROPHOMONAS MALTOPHILIA MODERATE CANDIDA GLABRATA NO ANAEROBES ISOLATED Performed at [facility], [place] [address], [place], [place] [address]    Report Status 03/03/2019 FINAL  Final   Organism ID, Bacteria ENTEROCOCCUS FAECALIS  Final   Organism ID, Bacteria STENOTROPHOMONAS MALTOPHILIA  Final      Susceptibility   Enterococcus faecalis - MIC*    AMPICILLIN <=2 SENSITIVE Sensitive     VANCOMYCIN >=32 RESISTANT Resistant     GENTAMICIN SYNERGY SENSITIVE Sensitive     LINEZOLID 2 SENSITIVE Sensitive     * FEW ENTEROCOCCUS FAECALIS   Stenotrophomonas maltophilia - MIC*    LEVOFLOXACIN >=8 RESISTANT Resistant     TRIMETH/SULFA <=20 SENSITIVE Sensitive     * FEW STENOTROPHOMONAS MALTOPHILIA  Body fluid culture     Status: None (Preliminary result)   Collection Time: 03/04/19 12:54 PM   Specimen: Lung, Right; Pleural Fluid  Result Value Ref Range Status   Specimen Description   Final    PLEURAL RIGHT Performed at [name] [address], [place], [place] [address]    Special Requests   Final    NONE Performed at [facility], [place] [address], [place], [place] [address]    Gram Stain   Final    RARE WBC PRESENT,BOTH PMN AND MONONUCLEAR NO ORGANISMS SEEN    Culture   Final    NO GROWTH < 24 HOURS Performed at [facility], [place] [address], [place], [place] [address]    Report Status PENDING  Incomplete         Radiology Studies: DG Chest Port 1 View  Result Date: 03/04/2019 CLINICAL DATA:   Right-sided thoracentesis. EXAM: PORTABLE CHEST 1 VIEW COMPARISON:  CTA of the abdomen and pelvis 03/03/2019 FINDINGS: The heart size is normal. Previously noted right pleural effusion significantly reduced. There is no pneumothorax. Heart size is normal. Small left effusion remains. Right-sided PICC line is in place. NG tube terminates in the stomach. IMPRESSION: 1. Marked decrease in right pleural effusion. 2. Small left pleural effusion. 3. No pneumothorax. 4. The support apparatus is stable. Electronically Signed   By: San Morelle M.D.   On: 03/04/2019 13:40   ECHOCARDIOGRAM COMPLETE  Result Date: 03/04/2019   ECHOCARDIOGRAM REPORT   Patient Name:   KEYUNDRA FANT Date of Exam: 03/04/2019 Medical Rec #:  [id number]       Height:       62.0 in Accession #:    [id number]      Weight:       117.5 lb Date of Birth:  1960/08/26        BSA:          1.52 m Patient Age:    43 years        BP:           119/57 mmHg Patient Gender: F               HR:           85 bpm. Exam Location:  Inpatient Procedure: 2D Echo, Cardiac Doppler and Color Doppler Indications:    Congestive Heart Failure 428.0  History:  Patient has no prior history of Echocardiogram examinations.                 Risk Factors:Hypertension, Diabetes and Non-Smoker. Septic                 shock.  Sonographer:    Paulita Fujita RDCS Referring Phys: [id number] Glorine Hanratty IMPRESSIONS  1. Left ventricular ejection fraction, by visual estimation, is 60 to 65%. The left ventricle has normal function. There is no left ventricular hypertrophy.  2. The left ventricle demonstrates regional wall motion abnormalities.  3. Global right ventricle has normal systolic function.The right ventricular size is normal. No increase in right ventricular wall thickness.  4. Left atrial size was normal.  5. Right atrial size was normal.  6. The mitral valve is normal in structure. No evidence of mitral valve regurgitation. No evidence of mitral stenosis.  7.  The tricuspid valve is normal in structure. Tricuspid valve regurgitation is not demonstrated.  8. The aortic valve is normal in structure. Aortic valve regurgitation is not visualized. No evidence of aortic valve sclerosis or stenosis.  9. The pulmonic valve was normal in structure. Pulmonic valve regurgitation is not visualized. 10. TR signal is inadequate for assessing pulmonary artery systolic pressure. 11. The inferior vena cava is normal in size with greater than 50% respiratory variability, suggesting right atrial pressure of 3 mmHg. FINDINGS  Left Ventricle: Left ventricular ejection fraction, by visual estimation, is 60 to 65%. The left ventricle has normal function. The left ventricle demonstrates regional wall motion abnormalities. The left ventricular internal cavity size was the left ventricle is normal in size. There is no left ventricular hypertrophy. Left ventricular diastolic parameters were normal. Normal left atrial pressure. Right Ventricle: The right ventricular size is normal. No increase in right ventricular wall thickness. Global RV systolic function is has normal systolic function. Left Atrium: Left atrial size was normal in size. Right Atrium: Right atrial size was normal in size Pericardium: There is no evidence of pericardial effusion. Mitral Valve: The mitral valve is normal in structure. No evidence of mitral valve regurgitation. No evidence of mitral valve stenosis by observation. Tricuspid Valve: The tricuspid valve is normal in structure. Tricuspid valve regurgitation is not demonstrated. Aortic Valve: The aortic valve is normal in structure. Aortic valve regurgitation is not visualized. The aortic valve is structurally normal, with no evidence of sclerosis or stenosis. Pulmonic Valve: The pulmonic valve was normal in structure. Pulmonic valve regurgitation is not visualized. Pulmonic regurgitation is not visualized. Aorta: The aortic root, ascending aorta and aortic arch are all  structurally normal, with no evidence of dilitation or obstruction. Venous: The inferior vena cava is normal in size with greater than 50% respiratory variability, suggesting right atrial pressure of 3 mmHg. IAS/Shunts: No atrial level shunt detected by color flow Doppler. There is no evidence of a patent foramen ovale. No ventricular septal defect is seen or detected. There is no evidence of an atrial septal defect.  LEFT VENTRICLE PLAX 2D LVIDd:         4.40 cm  Diastology LVIDs:         3.20 cm  LV e' lateral:   11.50 cm/s LV PW:         0.80 cm  LV E/e' lateral: 6.6 LV IVS:        0.80 cm  LV e' medial:    7.94 cm/s LVOT diam:     1.70 cm  LV E/e' medial:  9.6  LV SV:         47 ml LV SV Index:   30.55 LVOT Area:     2.27 cm  RIGHT VENTRICLE RV S prime:     12.30 cm/s TAPSE (M-mode): 2.0 cm LEFT ATRIUM             Index       RIGHT ATRIUM          Index LA diam:        2.70 cm 1.77 cm/m  RA Area:     9.64 cm LA Vol (A2C):   22.4 ml 14.69 ml/m RA Volume:   16.40 ml 10.75 ml/m LA Vol (A4C):   20.3 ml 13.31 ml/m LA Biplane Vol: 21.6 ml 14.16 ml/m  AORTIC VALVE LVOT Vmax:   118.00 cm/s LVOT Vmean:  80.800 cm/s LVOT VTI:    0.208 m  AORTA Ao Root diam: 2.70 cm MITRAL VALVE MV Area (PHT): 3.93 cm             SHUNTS MV PHT:        55.97 msec           Systemic VTI:  0.21 m MV Decel Time: 193 msec             Systemic Diam: 1.70 cm MV E velocity: 76.30 cm/s 103 cm/s MV A velocity: 93.00 cm/s 70.3 cm/s MV E/A ratio:  0.82       1.5  Mihai Croitoru MD Electronically signed by Sanda Klein MD Signature Date/Time: 03/04/2019/4:31:24 PM    Final    CT Angio Abd/Pel w/ and/or w/o  Result Date: 03/03/2019 CLINICAL DATA:  Duodenal ulcer, post surgical repair with postop anterior peritoneal fluid collection, post pigtail drain catheter placement 02/26/2019. New hypotension and tachycardia. EXAM: CTA ABDOMEN AND PELVIS WITHOUT AND WITH CONTRAST TECHNIQUE: Multidetector CT imaging of the abdomen and pelvis was  performed using the standard protocol during bolus administration of intravenous contrast. Multiplanar reconstructed images and MIPs were obtained and reviewed to evaluate the vascular anatomy. CONTRAST:  164m OMNIPAQUE IOHEXOL 350 MG/ML SOLN COMPARISON:  02/25/2019 FINDINGS: VASCULAR Aorta: Mild scattered calcified plaque. No aneurysm, dissection, or stenosis. Celiac: Patent without evidence of aneurysm, dissection, vasculitis or significant stenosis. Left hepatic artery arises from left gastric artery, an anatomic variant. SMA: Patent without evidence of aneurysm, dissection, vasculitis or significant stenosis. Renals: Single left, widely patent. Duplicated right, superior dominant, both patent. IMA: Patent without evidence of aneurysm, dissection, vasculitis or significant stenosis. Inflow: Patent without evidence of aneurysm, dissection, vasculitis or significant stenosis. Proximal Outflow: Bilateral common femoral and visualized portions of the superficial and profunda femoral arteries are patent without evidence of aneurysm, dissection, vasculitis or significant stenosis. Veins: Iliac venous system unremarkable. IVC is nondistended. Patent bilateral renal veins. Patent SMV, splenic vein, portal vein. Hepatic veins patent. Review of the MIP images confirms the above findings. NON-VASCULAR Lower chest: Some increase in moderate right pleural effusion. Extensive consolidation/atelectasis in the visualized right lower lobe. Patchy subsegmental atelectasis/consolidation/scarring posteriorly at the left lung base. Hepatobiliary: 1.4 cm partially calcified stone in the nondilated gallbladder. No focal liver lesion or biliary ductal dilatation. Pancreas: Unremarkable. No pancreatic ductal dilatation or surrounding inflammatory changes. Spleen: Normal in size without focal abnormality. Adrenals/Urinary Tract: Adrenal glands unremarkable. Normal bilateral renal enhancement. No hydronephrosis. Urinary bladder is  distended. Stomach/Bowel: No evidence of active extravasation. Nasogastric tube extends into decompressed stomach. There is fluid distention of the proximal duodenum. Multiple fluid distended small bowel loops.  There is a small amount of fluid in the cecum and proximal ascending colon, the remainder of the colon virtually completely decompressed. Lymphatic: Left para-aortic nodes up to 0.9 cm short axis diameter. Prominent 8 mm distal right external iliac node. No definite mesenteric adenopathy. Reproductive: Uterus and bilateral adnexa are unremarkable. Other: Little overall change in simple appearing fluid collection lateral and inferior to the greater curvature of the stomach. Right infrahepatic surgical drain without significant adjacent fluid. Complex loculated peripherally enhancing collection in the anterior peritoneal cavity has increased in size now measuring 15.8 x 3.9 cm maximum transverse dimensions, previously 11.7 x 1.7 cm by my measurement. This contains mixed attenuation material and some scattered gas bubbles. Percutaneous drain catheter appears adequately positioned at the inferior margin of this collection. Small loculated fluid collection posterior to the uterus is stable. No definite new fluid collections. No free air. Musculoskeletal: No fracture or worrisome bone lesion. Bilateral sacroiliitis. IMPRESSION: 1. No active GI bleed or other acute vascular findings. 2. Interval increase in size of large complex anterior peritoneal fluid collection, despite percutaneous drain catheter at the inferior margin. 3. Persistent fluid distended small bowel loops, possibly secondary to ileus. 4. Increasing moderate right pleural effusion with extensive right lower lobe consolidation/atelectasis. 5. Cholelithiasis. 6. Bilateral sacroiliitis. Electronically Signed   By: Lucrezia Europe M.D.   On: 03/03/2019 17:00   US THORACENTESIS ASP PLEURAL SPACE W/IMG GUIDE  Result Date: 03/04/2019 INDICATION: Patient  with history of septic shock due to ruptured gastric ulcer with peritonitis, abdominal abscess, right pleural effusion; request received for diagnostic and therapeutic right thoracentesis. EXAM: ULTRASOUND GUIDED DIAGNOSTIC AND THERAPEUTIC RIGHT THORACENTESIS MEDICATIONS: None COMPLICATIONS: None immediate. PROCEDURE: An ultrasound guided thoracentesis was thoroughly discussed with the patient's daughter and questions answered. The benefits, risks, alternatives and complications were also discussed. The patient understands and wishes to proceed with the procedure. Written consent was obtained. Ultrasound was performed to localize and mark an adequate pocket of fluid in the right chest. The area was then prepped and draped in the normal sterile fashion. 1% Lidocaine was used for local anesthesia. Under ultrasound guidance a 6 Fr Safe-T-Centesis catheter was introduced. Thoracentesis was performed. The catheter was removed and a dressing applied. FINDINGS: A total of approximately 710 cc of yellow fluid was removed. Samples were sent to the laboratory as requested by the clinical team. IMPRESSION: Successful ultrasound guided diagnostic and therapeutic right thoracentesis yielding 710 cc of pleural fluid. Read by: Rowe Robert, PA-C Electronically Signed   By: Jacqulynn Cadet M.D.   On: 03/04/2019 12:43        Scheduled Meds: . sodium chloride   Intravenous Once  . sodium chloride   Intravenous Once  . chlorhexidine gluconate (MEDLINE KIT)  15 mL Mouth Rinse BID  . Chlorhexidine Gluconate Cloth  6 each Topical Daily  . collagenase   Topical Daily  . Gerhardt's butt cream   Topical TID  . insulin aspart  0-20 Units Subcutaneous Q6H  . metoprolol tartrate  2.5 mg Intravenous Q6H  . pantoprazole (PROTONIX) IV  40 mg Intravenous Q12H  . sodium chloride flush  10-40 mL Intracatheter Q12H  . sodium chloride flush  5 mL Intracatheter Q8H   Continuous Infusions: . sodium chloride 10 mL/hr at 03/04/19  0558  . sodium chloride 10 mL/hr at 03/04/19 0202  . ampicillin-sulbactam (UNASYN) IV Stopped (03/05/19 1017)  . anidulafungin Stopped (03/04/19 1756)  . sulfamethoxazole-trimethoprim Stopped (03/05/19 0523)  . TPN ADULT (ION) 90 mL/hr at 03/05/19  0600  . TPN ADULT (ION)       LOS: 31 days    Time spent: 35 mins.More than 50% of that time was spent in counseling and/or coordination of care.      Shelly Coss, MD [facility] Pager [phone]  If 7PM-7AM, please contact night-coverage [email or web address] Password [facility] 03/05/2019, 10:51 AM

## 2019-03-05 NOTE — Progress Notes (Signed)
CRITICAL VALUE ALERT  Critical Value:  Hgb 6.2  Date & Time Notied:  03/05/2019 0300  Provider Notified: M. Sharlet Salina   Orders Received/Actions taken: Awaiting orders. Will continue to monitor the patient

## 2019-03-05 NOTE — Progress Notes (Signed)
Progress Note     Brief Narrative:  58 yo female with multiple medical problems admitted with DKA, AKI,  poorly controlled DM, perforated duodenal ulcer s/p [name] patch late November. Developed leak with bloody drainage. LLQ drain placed 12/16 for intraabdominal abscess (multiple pathogens). . ID following. On prolonged antibiotics / anti-fungals. We were consulted on 03/02/20 for evaluation of bloody BMs and bloody JP drain output. We had concern for ischemic colitis vr mesenteric ischemia vrs bleeding from duodenal ulcer. Obtained CT angiogram which showed no active bleeding but did how increased size of complex peritoneal fluid collection despite drain.    ASSESSMENT AND PLAN:    Recurrent anemia, hgb 7.6 >> 6.2. Getting another unit of blood now. Large amount of bloody output from JP drain in RUQ. Blood in LLQ drain as well but less than in JP drain. Son at bedside. Patient appears comfortable. Palliative Care has evaluated. She has been made [place] and transferring to [place]    No complaints at present   OBJECTIVE:     Vital signs in last 24 hours: Temp:  [97.5 F (36.4 C)-99.2 F (37.3 C)] 99.2 F (37.3 C) (12/23 1055) Pulse Rate:  [75-107] 98 (12/23 1100) Resp:  [9-21] 15 (12/23 1100) BP: (97-129)/(46-86) 119/56 (12/23 1100) SpO2:  [100 %] 100 % (12/23 1055) Last BM Date: 03/03/19 General:   Alert, in NAD. Son at bedside EENT:  Normal hearing, non icteric sclera   Heart:  Regular rate  Pulm: Normal respiratory effort   Abdomen:  Soft, nondistended, a few BS. Blood output in JP drain and also IR drain in LLQ          Psych:  cooperative.    Intake/Output from previous day: 12/22 0701 - 12/23 0700 In: 3711.8 [I.V.:2250; IV Piggyback:1456.9] Out: 2225 [Urine:1200; Emesis/NG output:600; Drains:425] Intake/Output this shift: Total I/O In: 30 [Blood:30] Out: -   Lab Results: Recent Labs    03/03/19 1300 03/04/19 0210  03/05/19 0207  WBC 16.1* 14.5* 13.2*  HGB 9.0* 7.6* 6.2*  HCT 30.1* 25.8* 20.8*  PLT 598* 557* 450*   BMET Recent Labs    03/03/19 0201 03/05/19 0207  NA 143 139  K 4.4 4.9  CL 111 110  CO2 25 21*  GLUCOSE 133* 111*  BUN 44* 60*  CREATININE 0.54 0.85  CALCIUM 9.2 8.5*   LFT Recent Labs    03/03/19 0201  PROT 6.9  ALBUMIN 1.5*  AST 35  ALT 33  ALKPHOS 369*  BILITOT 0.6   PT/INR No results for input(s): LABPROT, INR in the last 72 hours. Hepatitis Panel No results for input(s): HEPBSAG, HCVAB, HEPAIGM, HEPBIGM in the last 72 hours.  DG Chest Port 1 View  Result Date: 03/04/2019 CLINICAL DATA:  Right-sided thoracentesis. EXAM: PORTABLE CHEST 1 VIEW COMPARISON:  CTA of the abdomen and pelvis 03/03/2019 FINDINGS: The heart size is normal. Previously noted right pleural effusion significantly reduced. There is no pneumothorax. Heart size is normal. Small left effusion remains. Right-sided PICC line is in place. NG tube terminates in the stomach. IMPRESSION: 1. Marked decrease in right pleural effusion. 2. Small left pleural effusion. 3. No pneumothorax. 4. The support apparatus is stable. Electronically Signed   By: San Morelle M.D.   On: 03/04/2019 13:40   ECHOCARDIOGRAM COMPLETE  Result Date: 03/04/2019   ECHOCARDIOGRAM REPORT   Patient Name:   Gina [name] Date of Exam: 03/04/2019 Medical Rec #:  [id number]  Height:       62.0 in Accession #:    [id number]      Weight:       117.5 lb Date of Birth:  1960-04-12        BSA:          1.52 m Patient Age:    19 years        BP:           119/57 mmHg Patient Gender: F               HR:           85 bpm. Exam Location:  Inpatient Procedure: 2D Echo, Cardiac Doppler and Color Doppler Indications:    Congestive Heart Failure 428.0  History:        Patient has no prior history of Echocardiogram examinations.                 Risk Factors:Hypertension, Diabetes and Non-Smoker. Septic                 shock.  Sonographer:     Paulita Fujita RDCS Referring Phys: [id number] AMRIT ADHIKARI IMPRESSIONS  1. Left ventricular ejection fraction, by visual estimation, is 60 to 65%. The left ventricle has normal function. There is no left ventricular hypertrophy.  2. The left ventricle demonstrates regional wall motion abnormalities.  3. Global right ventricle has normal systolic function.The right ventricular size is normal. No increase in right ventricular wall thickness.  4. Left atrial size was normal.  5. Right atrial size was normal.  6. The mitral valve is normal in structure. No evidence of mitral valve regurgitation. No evidence of mitral stenosis.  7. The tricuspid valve is normal in structure. Tricuspid valve regurgitation is not demonstrated.  8. The aortic valve is normal in structure. Aortic valve regurgitation is not visualized. No evidence of aortic valve sclerosis or stenosis.  9. The pulmonic valve was normal in structure. Pulmonic valve regurgitation is not visualized. 10. TR signal is inadequate for assessing pulmonary artery systolic pressure. 11. The inferior vena cava is normal in size with greater than 50% respiratory variability, suggesting right atrial pressure of 3 mmHg. FINDINGS  Left Ventricle: Left ventricular ejection fraction, by visual estimation, is 60 to 65%. The left ventricle has normal function. The left ventricle demonstrates regional wall motion abnormalities. The left ventricular internal cavity size was the left ventricle is normal in size. There is no left ventricular hypertrophy. Left ventricular diastolic parameters were normal. Normal left atrial pressure. Right Ventricle: The right ventricular size is normal. No increase in right ventricular wall thickness. Global RV systolic function is has normal systolic function. Left Atrium: Left atrial size was normal in size. Right Atrium: Right atrial size was normal in size Pericardium: There is no evidence of pericardial effusion. Mitral Valve: The mitral  valve is normal in structure. No evidence of mitral valve regurgitation. No evidence of mitral valve stenosis by observation. Tricuspid Valve: The tricuspid valve is normal in structure. Tricuspid valve regurgitation is not demonstrated. Aortic Valve: The aortic valve is normal in structure. Aortic valve regurgitation is not visualized. The aortic valve is structurally normal, with no evidence of sclerosis or stenosis. Pulmonic Valve: The pulmonic valve was normal in structure. Pulmonic valve regurgitation is not visualized. Pulmonic regurgitation is not visualized. Aorta: The aortic root, ascending aorta and aortic arch are all structurally normal, with no evidence of dilitation or obstruction. Venous: The inferior vena cava is normal  in size with greater than 50% respiratory variability, suggesting right atrial pressure of 3 mmHg. IAS/Shunts: No atrial level shunt detected by color flow Doppler. There is no evidence of a patent foramen ovale. No ventricular septal defect is seen or detected. There is no evidence of an atrial septal defect.  LEFT VENTRICLE PLAX 2D LVIDd:         4.40 cm  Diastology LVIDs:         3.20 cm  LV e' lateral:   11.50 cm/s LV PW:         0.80 cm  LV E/e' lateral: 6.6 LV IVS:        0.80 cm  LV e' medial:    7.94 cm/s LVOT diam:     1.70 cm  LV E/e' medial:  9.6 LV SV:         47 ml LV SV Index:   30.55 LVOT Area:     2.27 cm  RIGHT VENTRICLE RV S prime:     12.30 cm/s TAPSE (M-mode): 2.0 cm LEFT ATRIUM             Index       RIGHT ATRIUM          Index LA diam:        2.70 cm 1.77 cm/m  RA Area:     9.64 cm LA Vol (A2C):   22.4 ml 14.69 ml/m RA Volume:   16.40 ml 10.75 ml/m LA Vol (A4C):   20.3 ml 13.31 ml/m LA Biplane Vol: 21.6 ml 14.16 ml/m  AORTIC VALVE LVOT Vmax:   118.00 cm/s LVOT Vmean:  80.800 cm/s LVOT VTI:    0.208 m  AORTA Ao Root diam: 2.70 cm MITRAL VALVE MV Area (PHT): 3.93 cm             SHUNTS MV PHT:        55.97 msec           Systemic VTI:  0.21 m MV Decel Time:  193 msec             Systemic Diam: 1.70 cm MV E velocity: 76.30 cm/s 103 cm/s MV A velocity: 93.00 cm/s 70.3 cm/s MV E/A ratio:  0.82       1.5  Mihai Croitoru MD Electronically signed by Sanda Klein MD Signature Date/Time: 03/04/2019/4:31:24 PM    Final    CT Angio Abd/Pel w/ and/or w/o  Result Date: 03/03/2019 CLINICAL DATA:  Duodenal ulcer, post surgical repair with postop anterior peritoneal fluid collection, post pigtail drain catheter placement 02/26/2019. New hypotension and tachycardia. EXAM: CTA ABDOMEN AND PELVIS WITHOUT AND WITH CONTRAST TECHNIQUE: Multidetector CT imaging of the abdomen and pelvis was performed using the standard protocol during bolus administration of intravenous contrast. Multiplanar reconstructed images and MIPs were obtained and reviewed to evaluate the vascular anatomy. CONTRAST:  150mL OMNIPAQUE IOHEXOL 350 MG/ML SOLN COMPARISON:  02/25/2019 FINDINGS: VASCULAR Aorta: Mild scattered calcified plaque. No aneurysm, dissection, or stenosis. Celiac: Patent without evidence of aneurysm, dissection, vasculitis or significant stenosis. Left hepatic artery arises from left gastric artery, an anatomic variant. SMA: Patent without evidence of aneurysm, dissection, vasculitis or significant stenosis. Renals: Single left, widely patent. Duplicated right, superior dominant, both patent. IMA: Patent without evidence of aneurysm, dissection, vasculitis or significant stenosis. Inflow: Patent without evidence of aneurysm, dissection, vasculitis or significant stenosis. Proximal Outflow: Bilateral common femoral and visualized portions of the superficial and profunda femoral arteries are patent without evidence of aneurysm,  dissection, vasculitis or significant stenosis. Veins: Iliac venous system unremarkable. IVC is nondistended. Patent bilateral renal veins. Patent SMV, splenic vein, portal vein. Hepatic veins patent. Review of the MIP images confirms the above findings. NON-VASCULAR  Lower chest: Some increase in moderate right pleural effusion. Extensive consolidation/atelectasis in the visualized right lower lobe. Patchy subsegmental atelectasis/consolidation/scarring posteriorly at the left lung base. Hepatobiliary: 1.4 cm partially calcified stone in the nondilated gallbladder. No focal liver lesion or biliary ductal dilatation. Pancreas: Unremarkable. No pancreatic ductal dilatation or surrounding inflammatory changes. Spleen: Normal in size without focal abnormality. Adrenals/Urinary Tract: Adrenal glands unremarkable. Normal bilateral renal enhancement. No hydronephrosis. Urinary bladder is distended. Stomach/Bowel: No evidence of active extravasation. Nasogastric tube extends into decompressed stomach. There is fluid distention of the proximal duodenum. Multiple fluid distended small bowel loops. There is a small amount of fluid in the cecum and proximal ascending colon, the remainder of the colon virtually completely decompressed. Lymphatic: Left para-aortic nodes up to 0.9 cm short axis diameter. Prominent 8 mm distal right external iliac node. No definite mesenteric adenopathy. Reproductive: Uterus and bilateral adnexa are unremarkable. Other: Little overall change in simple appearing fluid collection lateral and inferior to the greater curvature of the stomach. Right infrahepatic surgical drain without significant adjacent fluid. Complex loculated peripherally enhancing collection in the anterior peritoneal cavity has increased in size now measuring 15.8 x 3.9 cm maximum transverse dimensions, previously 11.7 x 1.7 cm by my measurement. This contains mixed attenuation material and some scattered gas bubbles. Percutaneous drain catheter appears adequately positioned at the inferior margin of this collection. Small loculated fluid collection posterior to the uterus is stable. No definite new fluid collections. No free air. Musculoskeletal: No fracture or worrisome bone lesion.  Bilateral sacroiliitis. IMPRESSION: 1. No active GI bleed or other acute vascular findings. 2. Interval increase in size of large complex anterior peritoneal fluid collection, despite percutaneous drain catheter at the inferior margin. 3. Persistent fluid distended small bowel loops, possibly secondary to ileus. 4. Increasing moderate right pleural effusion with extensive right lower lobe consolidation/atelectasis. 5. Cholelithiasis. 6. Bilateral sacroiliitis. Electronically Signed   By: Lucrezia Europe M.D.   On: 03/03/2019 17:00   US THORACENTESIS ASP PLEURAL SPACE W/IMG GUIDE  Result Date: 03/04/2019 INDICATION: Patient with history of septic shock due to ruptured gastric ulcer with peritonitis, abdominal abscess, right pleural effusion; request received for diagnostic and therapeutic right thoracentesis. EXAM: ULTRASOUND GUIDED DIAGNOSTIC AND THERAPEUTIC RIGHT THORACENTESIS MEDICATIONS: None COMPLICATIONS: None immediate. PROCEDURE: An ultrasound guided thoracentesis was thoroughly discussed with the patient's daughter and questions answered. The benefits, risks, alternatives and complications were also discussed. The patient understands and wishes to proceed with the procedure. Written consent was obtained. Ultrasound was performed to localize and mark an adequate pocket of fluid in the right chest. The area was then prepped and draped in the normal sterile fashion. 1% Lidocaine was used for local anesthesia. Under ultrasound guidance a 6 Fr Safe-T-Centesis catheter was introduced. Thoracentesis was performed. The catheter was removed and a dressing applied. FINDINGS: A total of approximately 710 cc of yellow fluid was removed. Samples were sent to the laboratory as requested by the clinical team. IMPRESSION: Successful ultrasound guided diagnostic and therapeutic right thoracentesis yielding 710 cc of pleural fluid. Read by: Rowe Robert, PA-C Electronically Signed   By: Jacqulynn Cadet M.D.   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

## 2019-03-05 NOTE — Progress Notes (Signed)
Nutrition Follow-up  RD working remotely.   DOCUMENTATION CODES:   Not applicable  INTERVENTION:  - none needed at this time for patient desiring to transition to comfort care.     NUTRITION DIAGNOSIS:   Inadequate oral intake related to altered GI function(pyloric channel ulcer perforation s/p ex lap) as evidenced by NPO status. 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 Patient accompanied by daughter who reports patient has been complaining of "flu-like symptoms" for the past few days endorsing fever and chills. Patient lives alone, but has a caretaker that lives nearby and found patient on the floor of her home very altered. In ED patient found to be hypothermic and severely hypotensive, BG >1000, CT a/p showed findings concerning for mesenteric ischemia.  Significant Events: 11/22- admission 11/23- ex lap; over-sewing of duodenal ulcer with graham patch; pyloric channel ulcer perf; NGT placed 11/29- extubation; NGT replaced 11/30- plan for initiation of TPN 12/1- TPN held 12/3-double lumen PICC placed in R brachial;TPN re-started 12/5- NGT removed 12/7- NGT replaced   Patient remains NPO and NGT remains in place with flow sheet documentation indicating 400 ml output during day shift yesterday and 200 ml output during night shift.   She has double lumen PICC in R brachial and continues to receive custom TPN at goal rate of 90 ml/hr. This is providing 2047 kcal, 129 grams protein to meet 100% estimated needs.  Weight was stable 12/14-12/17 and then trended slightly down.  Weight yesterday consistent with previously stable weight.   Per notes: - bleeding from drains and midline wound thought to be 2/2 decompression from intraperitoneal fluid - several fluid collections seen in ventral abdomen and in pelvis on CT - DNR and, as of this morning, is interested in transitioning to comfort care    Labs reviewed; CBG: 131 mg/dl, BUN: 60 mg/dl, Ca: 8.5 mg/dl. Medications reviewed; 20 mg IV lasix x1 dose 12/23, sliding scale novolog.    Diet Order:   Diet Order            Diet NPO time specified Except for: Ice Chips  Diet effective now              EDUCATION NEEDS:   No education needs have been identified at this time  Skin:  Skin Assessment: Skin Integrity Issues: Skin Integrity Issues:: Stage II, Unstageable, Incisions Stage II: L buttocks Unstageable: full thickness coccyx Incisions: abdomen (11/23)  Last BM:  12/21  Height:   Ht Readings from Last 1 Encounters:  02/07/19 5' 2"  (1.575 m)    Weight:   Wt Readings from Last 1 Encounters:  03/03/19 53.3 kg    Ideal Body Weight:  44.2 kg(Adjusted IBW for bilateral BKA)  BMI:  Body mass index is 21.49 kg/m.  Estimated Nutritional Needs:   Kcal:  2025-2330 kcal  Protein:  115-130 grams  Fluid:  >/= 2 L/day      Jarome Matin, MS, RD, LDN, [facility] Inpatient Clinical Dietitian Pager # [phone] After hours/weekend pager # [phone]

## 2019-03-06 DIAGNOSIS — Z66 Do not resuscitate: Secondary | ICD-10-CM

## 2019-03-06 LAB — CBC WITH DIFFERENTIAL/PLATELET
Abs Immature Granulocytes: UNDETERMINED 10*3/uL (ref 0.00–0.07)
Abs Immature Granulocytes: 0.44 10*3/uL — ABNORMAL HIGH (ref 0.00–0.07)
Band Neutrophils: UNDETERMINED %
Basophils Absolute: UNDETERMINED 10*3/uL (ref 0.0–0.1)
Basophils Absolute: 0 10*3/uL (ref 0.0–0.1)
Basophils Relative: UNDETERMINED %
Basophils Relative: 0 %
Blasts: UNDETERMINED %
Eosinophils Absolute: UNDETERMINED 10*3/uL (ref 0.0–0.5)
Eosinophils Absolute: 0.2 10*3/uL (ref 0.0–0.5)
Eosinophils Relative: UNDETERMINED %
Eosinophils Relative: 1 %
HCT: UNDETERMINED % (ref 36.0–46.0)
HCT: 29.9 % — ABNORMAL LOW (ref 36.0–46.0)
Hemoglobin: UNDETERMINED g/dL (ref 12.0–15.0)
Hemoglobin: 9.5 g/dL — ABNORMAL LOW (ref 12.0–15.0)
Immature Granulocytes: UNDETERMINED %
Immature Granulocytes: 4 %
Lymphocytes Relative: UNDETERMINED %
Lymphocytes Relative: 14 %
Lymphs Abs: UNDETERMINED 10*3/uL (ref 0.7–4.0)
Lymphs Abs: 1.4 10*3/uL (ref 0.7–4.0)
MCH: UNDETERMINED pg (ref 26.0–34.0)
MCH: 29.2 pg (ref 26.0–34.0)
MCHC: UNDETERMINED g/dL (ref 30.0–36.0)
MCHC: 31.8 g/dL (ref 30.0–36.0)
MCV: UNDETERMINED fL (ref 80.0–100.0)
MCV: 92 fL (ref 80.0–100.0)
Metamyelocytes Relative: UNDETERMINED %
Monocytes Absolute: UNDETERMINED 10*3/uL (ref 0.1–1.0)
Monocytes Absolute: 1.7 10*3/uL — ABNORMAL HIGH (ref 0.1–1.0)
Monocytes Relative: UNDETERMINED %
Monocytes Relative: 16 %
Myelocytes: UNDETERMINED %
Neutro Abs: UNDETERMINED 10*3/uL (ref 1.7–7.7)
Neutro Abs: 6.8 10*3/uL (ref 1.7–7.7)
Neutrophils Relative %: UNDETERMINED %
Neutrophils Relative %: 65 %
Other: UNDETERMINED %
Platelets: UNDETERMINED 10*3/uL (ref 150–400)
Platelets: 382 10*3/uL (ref 150–400)
Promyelocytes Relative: UNDETERMINED %
RBC Morphology: UNDETERMINED
RBC: UNDETERMINED MIL/uL (ref 3.87–5.11)
RBC: 3.25 MIL/uL — ABNORMAL LOW (ref 3.87–5.11)
RDW: UNDETERMINED % (ref 11.5–15.5)
RDW: 15 % (ref 11.5–15.5)
Smear Review: UNDETERMINED
WBC Morphology: UNDETERMINED
WBC: UNDETERMINED 10*3/uL (ref 4.0–10.5)
WBC: 10.6 10*3/uL — ABNORMAL HIGH (ref 4.0–10.5)
nRBC: UNDETERMINED % (ref 0.0–0.2)
nRBC: UNDETERMINED /100 WBC
nRBC: 0 % (ref 0.0–0.2)

## 2019-03-06 LAB — MAGNESIUM
Magnesium: UNDETERMINED mg/dL (ref 1.7–2.4)
Magnesium: 2.2 mg/dL (ref 1.7–2.4)

## 2019-03-06 LAB — GLUCOSE, CAPILLARY
Glucose-Capillary: 104 mg/dL — ABNORMAL HIGH (ref 70–99)
Glucose-Capillary: 129 mg/dL — ABNORMAL HIGH (ref 70–99)
Glucose-Capillary: 131 mg/dL — ABNORMAL HIGH (ref 70–99)
Glucose-Capillary: 163 mg/dL — ABNORMAL HIGH (ref 70–99)

## 2019-03-06 LAB — COMPREHENSIVE METABOLIC PANEL
ALT: 38 U/L (ref 0–44)
AST: 38 U/L (ref 15–41)
Albumin: 1.4 g/dL — ABNORMAL LOW (ref 3.5–5.0)
Alkaline Phosphatase: 293 U/L — ABNORMAL HIGH (ref 38–126)
Anion gap: 6 (ref 5–15)
BUN: 46 mg/dL — ABNORMAL HIGH (ref 6–20)
CO2: 24 mmol/L (ref 22–32)
Calcium: 8.2 mg/dL — ABNORMAL LOW (ref 8.9–10.3)
Chloride: 104 mmol/L (ref 98–111)
Creatinine, Ser: 0.74 mg/dL (ref 0.44–1.00)
GFR calc Af Amer: >60 mL/min (ref >60)
GFR calc non Af Amer: >60 mL/min (ref >60)
Glucose, Bld: 108 mg/dL — ABNORMAL HIGH (ref 70–99)
Potassium: 5.2 mmol/L — ABNORMAL HIGH (ref 3.5–5.1)
Sodium: 134 mmol/L — ABNORMAL LOW (ref 135–145)
Total Bilirubin: 0.3 mg/dL (ref 0.3–1.2)
Total Protein: 6.6 g/dL (ref 6.5–8.1)

## 2019-03-06 LAB — PHOSPHORUS
Phosphorus: UNDETERMINED mg/dL (ref 2.5–4.6)
Phosphorus: 3.6 mg/dL (ref 2.5–4.6)

## 2019-03-06 MED ORDER — TRAVASOL 10 % IV SOLN
INTRAVENOUS | Status: DC
  Filled 2019-03-06: qty 1296

## 2019-03-06 MED ORDER — TRAVASOL 10 % IV SOLN
INTRAVENOUS | Status: AC
  Filled 2019-03-06: qty 1296

## 2019-03-06 NOTE — Progress Notes (Signed)
OT Cancellation Note  Patient Details Name: Alany Shimabukuro MRN: [id number] DOB: [birth year]   Cancelled Treatment:    Reason Eval/Treat Not Completed: Other (comment)  Noted plans for comfort care.  Will sign off at this time  Kari Baars, [place] [phone] Office- [phone], Thereasa Parkin 03/06/2019, 8:47 AM

## 2019-03-06 NOTE — Progress Notes (Addendum)
PALLIATIVE NOTE:  I met with patient and her daughter this afternoon for continued goals of care discussion as they requested. FMLA paperwork was completed for daughter, [name], faxed, and she was given the original copy.   Patient awake, alert, oriented x3. She was laughing and talking with daughter and her pastor via facetime on my arrival. Her pastor offered prayer prior to hanging up.   Patient denied pain or shortness of breath. Ms. Falkner reports she feels "100 times better since blood". She states she feels like she has much more energy and not as tired. Daughter expressing how her mother looks so much better.   We discussed patient's expressed wishes on yesterday regarding transitioning to comfort and her "wanting to hold on until Christmas!" Daughter confirms similar discussion with her mother prior to my discussion with patient. Ms. Charette states tearfully that Christmas is her favorite holiday and she wants to be here to see it. She reports if she passes away after that she would be at peace. Daughter verbalizes that patient has received prayer for her salvation. Therapeutic listening and support given.   Patient reports she wanted to discuss with her daughter in person because she wanted to be sure this is what they wanted to do. Patient begins discussing her awareness from her daughter that they had received a call from [facility] with a bed offer. Patient expressing emotional confusion regarding this new information and the option to continue care and live versus transitioning to comfort and passing away. Daughter is tearful also expressing her frustration and confusion.   I discussed at length patients current condition, instability to transfer, prognosis focusing on patient's expressed feelings regarding her poor quality of life. I encouraged patient and daughter to consider patient's previous stated feelings and her overall quality of life. Patient and daughter tearful. Patient  states she would not want to live the rest of her life in the bed connected to tubes. Daughter agrees but also mentions she does not want to lose her mother.   Daughter asking questions regarding the differences in care at [facility] versus being in the hospital receiving aggressive care. All questions answered to the best of my knowledge.   Patient states she is confused but knows she would not be able to go home.   I openly and directly explained to patient and daughter patient's poor prognosis, what her life would look like, what her end-of-life would look like with comfort, and explained to her in detail the question at hand which is continued care with awareness of she is receiving the maximum level of care and interventions that are available with no meaningful recovery or if she did not wish to continue in her current state of health and goal is to be kept comfortable, enjoy what time she has left with family while managing symptoms.   Daughter and patient tearful. Patient again states she is not sure she would want to be this way for the remainder of her life stating that she is "miserable". Daughter agrees but also verbalizes this is a hard decision and states she feels more confused today knowing there may be another option. I attempted to explain again although a bed has been offered at [facility] patient is not stable to transfer and her prognosis remains poor.   During discussion I also physically showed daughter at patient's approval, the tubes and drains patient had, educating them on their function, the drainage that were present in each, and how they were directly related  to patient's care and prognosis. Daughter verbalized appreciation expressing this does make her see things differently being aware of what she see's and how it relates to all discussions.   Patient and daughter does confirm wishes as previously requested today for DNR/DNI. Patient has made it clear that she would not want  heroic measures in an emergent event.   Patient and daughter requesting to have some time to speak alone and think overnight while processing their feelings. Support given.   Daughter states she plans to call tomorrow with updates.   Time In: 1530 Time Out: 1725 Total Time: 110 min.   Greater than 50%  of this time was spent counseling and coordinating care related to the above assessment and plan.  Alda Lea, AGPCNP-BC Palliative Medicine Team

## 2019-03-06 NOTE — Progress Notes (Signed)
Physical Therapy Discharge Patient Details Name: Gina [name] MRN: [id number] DOB: [birth year] Today's Date: 03/06/2019 Time:  -     Patient discharged from PT services secondary to patient has made no progress toward goals in a reasonable time frame.  Please see latest therapy progress note for current level of functioning and progress toward goals.    Progress and discharge plan discussed with patient and/or caregiver: plans per palliative care team following pt  GP Pt with pain and crying out during therapy sessions.  Pt and family would like comfort care at this time. Palliative care following.       Duaine Radin,KATHrine E 03/06/2019, 3:44 PM  Jannette Spanner PT, DPT Acute Rehabilitation Services Office: [phone]

## 2019-03-06 NOTE — Progress Notes (Signed)
[place] [facility] Office:  [phone] General Surgery Progress Note   LOS: 32 days  POD -  32 Days Post-Op  Assessment and Plan: 1.  EXPLORATORY LAPAROTOMY WITH OVERSEW OF DUODENAL ULCER with [name] patch - 02/03/2019 - Lucia Gaskins        - persistent leak noted on CT scan with several fluid collections in the ventral abdomen and in pelvis.cont IR drain  On Unasyn/Eraxis/Bactrim  Prognosis continues to be poor - agree with consideration of comfort care   2. Poorly controlled DM2- A1c15.2 3.  S/p bilateral BKA  Ischemic changes to both LE- defer to medicineandDr. Duda  4.  Acute onCKD- improved 5.  HTN 6.  Schizophrenia  7. Severe malnutrition - on TPN  Prealbumin12.7 on 03/03/2019 8.  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  Candida glabratabacteremia-per ID, on antifungal 10.  Unstageable sacral wound-cont santyl. 11.  Anemia  Hgb - 9.0 - 03/05/2019   Principal Problem:   Septic shock (HCC) Active Problems:   DKA (diabetic ketoacidoses) (HCC)   Severe protein-calorie malnutrition ([place])   Acute kidney injury ([place])   Pressure injury of skin   Perforated gastric ulcer ([place])   Acute respiratory failure (HCC)   Duodenal perforation (HCC)   Below-knee amputee ([place])   Atherosclerosis of native arteries of extremities with gangrene, right leg (HCC)   Atherosclerosis of native arteries of extremities with gangrene, left leg (HCC)   Intra-abdominal fluid collection  Subjective:  Startles, disoriented.  Objective:   Vitals:   03/06/19 0700 03/06/19 0800  BP: 117/60   Pulse: 82   Resp: 13   Temp:  (!) 97.5 F (36.4 C)  SpO2:       Intake/Output from previous day:  12/23 0701 - 12/24 0700 In: 3740.6 [P.O.:25; I.V.:2365.1; Blood:475.5; IV Piggyback:875] Out: 3225 [Urine:2250; Emesis/NG output:550; Drains:425]  Intake/Output this shift:  No intake/output data recorded.   Physical Exam:   General: AA F who  arouses and respondes but is not oriented.    HEENT: Normal. Pupils equal. .   Lungs: Clear   Abdomen: Soft - RUQ/LUQ - 25/400 cc last 24 hours   Lab Results:    Recent Labs    03/05/19 0207 03/05/19 1713  WBC 13.2* 9.9  HGB 6.2* 9.0*  HCT 20.8* 27.6*  PLT 450* 380    BMET   Recent Labs    03/05/19 0207  NA 139  K 4.9  CL 110  CO2 21*  GLUCOSE 111*  BUN 60*  CREATININE 0.85  CALCIUM 8.5*    PT/INR  No results for input(s): LABPROT, INR in the last 72 hours.  ABG  No results for input(s): PHART, HCO3 in the last 72 hours.  Invalid input(s): PCO2, PO2   Studies/Results:  DG Chest Port 1 View  Result Date: 03/04/2019 CLINICAL DATA:  Right-sided thoracentesis. EXAM: PORTABLE CHEST 1 VIEW COMPARISON:  CTA of the abdomen and pelvis 03/03/2019 FINDINGS: The heart size is normal. Previously noted right pleural effusion significantly reduced. There is no pneumothorax. Heart size is normal. Small left effusion remains. Right-sided PICC line is in place. NG tube terminates in the stomach. IMPRESSION: 1. Marked decrease in right pleural effusion. 2. Small left pleural effusion. 3. No pneumothorax. 4. The support apparatus is stable. Electronically Signed   By: San Morelle M.D.   On: 03/04/2019 13:40   ECHOCARDIOGRAM COMPLETE  Result Date: 03/04/2019   ECHOCARDIOGRAM REPORT   Patient Name:   Gina [name]  Date of Exam: 03/04/2019 Medical Rec #:  [id number]       Height:       62.0 in Accession #:    [id number]      Weight:       117.5 lb Date of Birth:  1960-04-13        BSA:          1.52 m Patient Age:   58 years        BP:           119/57 mmHg Patient Gender: F               HR:           85 bpm. Exam Location:  Inpatient Procedure: 2D Echo, Cardiac Doppler and Color Doppler Indications:    Congestive Heart Failure 428.0  History:        Patient has no prior history of Echocardiogram examinations.                 Risk Factors:Hypertension, Diabetes and Non-Smoker.  Septic                 shock.  Sonographer:    Paulita Fujita RDCS Referring Phys: [id number] AMRIT ADHIKARI IMPRESSIONS  1. Left ventricular ejection fraction, by visual estimation, is 60 to 65%. The left ventricle has normal function. There is no left ventricular hypertrophy.  2. The left ventricle demonstrates regional wall motion abnormalities.  3. Global right ventricle has normal systolic function.The right ventricular size is normal. No increase in right ventricular wall thickness.  4. Left atrial size was normal.  5. Right atrial size was normal.  6. The mitral valve is normal in structure. No evidence of mitral valve regurgitation. No evidence of mitral stenosis.  7. The tricuspid valve is normal in structure. Tricuspid valve regurgitation is not demonstrated.  8. The aortic valve is normal in structure. Aortic valve regurgitation is not visualized. No evidence of aortic valve sclerosis or stenosis.  9. The pulmonic valve was normal in structure. Pulmonic valve regurgitation is not visualized. 10. TR signal is inadequate for assessing pulmonary artery systolic pressure. 11. The inferior vena cava is normal in size with greater than 50% respiratory variability, suggesting right atrial pressure of 3 mmHg. FINDINGS  Left Ventricle: Left ventricular ejection fraction, by visual estimation, is 60 to 65%. The left ventricle has normal function. The left ventricle demonstrates regional wall motion abnormalities. The left ventricular internal cavity size was the left ventricle is normal in size. There is no left ventricular hypertrophy. Left ventricular diastolic parameters were normal. Normal left atrial pressure. Right Ventricle: The right ventricular size is normal. No increase in right ventricular wall thickness. Global RV systolic function is has normal systolic function. Left Atrium: Left atrial size was normal in size. Right Atrium: Right atrial size was normal in size Pericardium: There is no evidence of  pericardial effusion. Mitral Valve: The mitral valve is normal in structure. No evidence of mitral valve regurgitation. No evidence of mitral valve stenosis by observation. Tricuspid Valve: The tricuspid valve is normal in structure. Tricuspid valve regurgitation is not demonstrated. Aortic Valve: The aortic valve is normal in structure. Aortic valve regurgitation is not visualized. The aortic valve is structurally normal, with no evidence of sclerosis or stenosis. Pulmonic Valve: The pulmonic valve was normal in structure. Pulmonic valve regurgitation is not visualized. Pulmonic regurgitation is not visualized. Aorta: The aortic root, ascending aorta and aortic arch are all structurally  normal, with no evidence of dilitation or obstruction. Venous: The inferior vena cava is normal in size with greater than 50% respiratory variability, suggesting right atrial pressure of 3 mmHg. IAS/Shunts: No atrial level shunt detected by color flow Doppler. There is no evidence of a patent foramen ovale. No ventricular septal defect is seen or detected. There is no evidence of an atrial septal defect.  LEFT VENTRICLE PLAX 2D LVIDd:         4.40 cm  Diastology LVIDs:         3.20 cm  LV e' lateral:   11.50 cm/s LV PW:         0.80 cm  LV E/e' lateral: 6.6 LV IVS:        0.80 cm  LV e' medial:    7.94 cm/s LVOT diam:     1.70 cm  LV E/e' medial:  9.6 LV SV:         47 ml LV SV Index:   30.55 LVOT Area:     2.27 cm  RIGHT VENTRICLE RV S prime:     12.30 cm/s TAPSE (M-mode): 2.0 cm LEFT ATRIUM             Index       RIGHT ATRIUM          Index LA diam:        2.70 cm 1.77 cm/m  RA Area:     9.64 cm LA Vol (A2C):   22.4 ml 14.69 ml/m RA Volume:   16.40 ml 10.75 ml/m LA Vol (A4C):   20.3 ml 13.31 ml/m LA Biplane Vol: 21.6 ml 14.16 ml/m  AORTIC VALVE LVOT Vmax:   118.00 cm/s LVOT Vmean:  80.800 cm/s LVOT VTI:    0.208 m  AORTA Ao Root diam: 2.70 cm MITRAL VALVE MV Area (PHT): 3.93 cm             SHUNTS MV PHT:        55.97 msec            Systemic VTI:  0.21 m MV Decel Time: 193 msec             Systemic Diam: 1.70 cm MV E velocity: 76.30 cm/s 103 cm/s MV A velocity: 93.00 cm/s 70.3 cm/s MV E/A ratio:  0.82       1.5  Mihai Croitoru MD Electronically signed by Sanda Klein MD Signature Date/Time: 03/04/2019/4:31:24 PM    Final    US THORACENTESIS ASP PLEURAL SPACE W/IMG GUIDE  Result Date: 03/04/2019 INDICATION: Patient with history of septic shock due to ruptured gastric ulcer with peritonitis, abdominal abscess, right pleural effusion; request received for diagnostic and therapeutic right thoracentesis. EXAM: ULTRASOUND GUIDED DIAGNOSTIC AND THERAPEUTIC RIGHT THORACENTESIS MEDICATIONS: None COMPLICATIONS: None immediate. PROCEDURE: An ultrasound guided thoracentesis was thoroughly discussed with the patient's daughter and questions answered. The benefits, risks, alternatives and complications were also discussed. The patient understands and wishes to proceed with the procedure. Written consent was obtained. Ultrasound was performed to localize and mark an adequate pocket of fluid in the right chest. The area was then prepped and draped in the normal sterile fashion. 1% Lidocaine was used for local anesthesia. Under ultrasound guidance a 6 Fr Safe-T-Centesis catheter was introduced. Thoracentesis was performed. The catheter was removed and a dressing applied. FINDINGS: A total of approximately 710 cc of yellow fluid was removed. Samples were sent to the laboratory as requested by the clinical team. IMPRESSION: Successful ultrasound guided  diagnostic and therapeutic right thoracentesis yielding 710 cc of pleural fluid. Read by: Rowe Robert, PA-C Electronically Signed   By: Jacqulynn Cadet M.D.   On: 03/04/2019 12:43     Anti-infectives:   Anti-infectives (From admission, onward)   Start     Dose/Rate Route Frequency Ordered Stop   03/03/19 1600  sulfamethoxazole-trimethoprim (BACTRIM) 200 mg of trimethoprim in dextrose 5 %  250 mL IVPB     200 mg of trimethoprim 262.5 mL/hr over 60 Minutes Intravenous Every 6 hours 03/03/19 1457 03/18/19 0159   03/01/19 1800  Ampicillin-Sulbactam (UNASYN) 3 g in sodium chloride 0.9 % 100 mL IVPB     3 g 200 mL/hr over 30 Minutes Intravenous Every 8 hours 03/01/19 1304 03/17/19 2359   02/28/19 1600  anidulafungin (ERAXIS) 100 mg in sodium chloride 0.9 % 100 mL IVPB     100 mg 78 mL/hr over 100 Minutes Intravenous Every 24 hours 02/27/19 1346 03/17/19 2359   02/27/19 1600  anidulafungin (ERAXIS) 200 mg in sodium chloride 0.9 % 200 mL IVPB     200 mg 78 mL/hr over 200 Minutes Intravenous  Once 02/27/19 1346 02/27/19 1840   02/18/19 1800  piperacillin-tazobactam (ZOSYN) IVPB 3.375 g  Status:  Discontinued     3.375 g 12.5 mL/hr over 240 Minutes Intravenous Every 8 hours 02/18/19 1652 03/01/19 1259   02/05/19 1000  anidulafungin (ERAXIS) 100 mg in sodium chloride 0.9 % 100 mL IVPB  Status:  Discontinued     100 mg 78 mL/hr over 100 Minutes Intravenous Every 24 hours 02/04/19 0840 02/25/19 1604   02/04/19 1000  anidulafungin (ERAXIS) 200 mg in sodium chloride 0.9 % 200 mL IVPB     200 mg 78 mL/hr over 200 Minutes Intravenous  Once 02/04/19 0840 02/04/19 1301   02/04/19 0200  fluconazole (DIFLUCAN) IVPB 200 mg  Status:  Discontinued     200 mg 100 mL/hr over 60 Minutes Intravenous Every 24 hours 02/02/19 2302 02/04/19 0840   02/03/19 1800  vancomycin (VANCOCIN) IVPB 750 mg/150 ml premix  Status:  Discontinued     750 mg 150 mL/hr over 60 Minutes Intravenous Every 24 hours 02/03/19 0241 02/03/19 0242   02/03/19 1800  vancomycin (VANCOCIN) IVPB 750 mg/150 ml premix  Status:  Discontinued     750 mg 150 mL/hr over 60 Minutes Intravenous Every 24 hours 02/03/19 0242 02/03/19 0858   02/03/19 0600  piperacillin-tazobactam (ZOSYN) IVPB 3.375 g     3.375 g 12.5 mL/hr over 240 Minutes Intravenous Every 8 hours 02/03/19 0243 02/17/19 0205   02/02/19 2315  fluconazole (DIFLUCAN) IVPB 400  mg     400 mg 100 mL/hr over 120 Minutes Intravenous STAT 02/02/19 2301 02/03/19 0218   02/02/19 1800  ceFEPIme (MAXIPIME) 2 g in sodium chloride 0.9 % 100 mL IVPB     2 g 200 mL/hr over 30 Minutes Intravenous  Once 02/02/19 1745 02/02/19 2030   02/02/19 1800  metroNIDAZOLE (FLAGYL) IVPB 500 mg     500 mg 100 mL/hr over 60 Minutes Intravenous  Once 02/02/19 1745 02/02/19 2237   02/02/19 1800  vancomycin (VANCOCIN) IVPB 1000 mg/200 mL premix  Status:  Discontinued     1,000 mg 200 mL/hr over 60 Minutes Intravenous  Once 02/02/19 1745 02/02/19 1747   02/02/19 1800  vancomycin (VANCOCIN) 1,250 mg in sodium chloride 0.9 % 250 mL IVPB     1,250 mg 166.7 mL/hr over 90 Minutes Intravenous  Once 02/02/19 1747 02/02/19 2237  Alphonsa Overall, MD, [facility] Surgery Office: [phone] 03/06/2019

## 2019-03-06 NOTE — Progress Notes (Signed)
Pharmacy Antibiotic Note  Gina [name] is a 58 y.o. female with perforated gastric ulcer & complicated post-op course.  Pharmacy has been consulted for Unasyn dosing.  03/06/2019:  D33 total abx (D5 Unasyn, D31 Eraxis D4 Bactrim)  Afebrile, WBC >>10.6, Scr stable  K+ 5.2 today- K removed from TPN  Plan: Continue Bactrim 200mg  IV q6h Continue Unasyn 3gm IV q8h Eraxis 100mg  IV q24h Monitor renal function, K+ and cx data   Height: 5\' 2"  (157.5 cm) Weight: 117 lb 8.1 oz (53.3 kg) IBW/kg (Calculated) : 50.1  Temp (24hrs), Avg:98.5 F (36.9 C), Min:97.5 F (36.4 C), Max:99.2 F (37.3 C)  Recent Labs  Lab 03/03/19 0201 03/04/19 0210 03/05/19 0207 03/05/19 1713 03/06/19 1019 03/06/19 1138  WBC 9.2 14.5* 13.2* 9.9 SPECIMEN CONTAMINATED, UNABLE TO PERFORM TEST(S). 10.6*  CREATININE 0.54  --  0.85  --   --  0.74    Estimated Creatinine Clearance: 61.4 mL/min (by C-G formula based on SCr of 0.74 mg/dL).    Allergies  Allergen Reactions  . Bee Venom Swelling    Swells up with bee stings   . Metformin And Related Other (See Comments)    Upset stomach    Antimicrobials this admission: 11/22 Vanc/Cefepime/Flagyl >> 11/23   Zosyn 11/23 >> 12/06 >> 12/8 >> 12/19 11/22 Fluconazole >> Eraxis 11/24 >> 12/15  12/17>> 12/19 Unasyn>>  12/21 Bactrim>>  Microbiology results: 11/22 BCx x1: C glabrata S-echinocandins, Diflucan (MIC 16; dose dependent) - MICs for AmphoB, Vori, Itra, Posa, flucytosine, but no breakpoints given 11/22 UCx: Klebsiella (R to ampicillin, I to nitro) 11/22 Cov2: negative 11/23 peritoneal culture: C.albicans, C glabrata 11/23 MRSA PCR: negative 11/25 HCV Ab+ 11/24 BCx: NG-final 11/28 BCx: ng-final 12/8: BCx: NGF 12/16 Abscess LLQ drain catheter: enterococcus faecalis (R=Vanc; S=amp); stenotrophomonas (S= Bactrim, R-LVQ) 12/22 Pleural Fluid Cx: NGTD  Thank you for allowing pharmacy to be a part of this patient's care.  Netta Cedars, PharmD,  BCPS 03/06/2019 12:53 PM

## 2019-03-06 NOTE — Progress Notes (Addendum)
PHARMACY - TOTAL PARENTERAL NUTRITION CONSULT NOTE   Indication: Small bowel obstruction  Patient Measurements: Height: _0  (157.5 cm) Weight: 117 lb 8.1 oz (53.3 kg) IBW/kg (Calculated) : 50.1 TPN AdjBW (KG): 57.9 Body mass index is 21.49 kg/m. Usual Weight: 55-60 kg Ideal Body Weight:  44.2 kg(Adjusted IBW for bilateral BKA)  Assessment:  [name] with poorly controlled DM s/p BL BKA, admitted 11/23 with AMS, n/v, abdominal pain. CT abdomen showed pneumoperitoneum and mesenteric ischemia. Underwent ex lap on 11/23 with finding of perforated prepyloric ulcer which was oversewn, along with gross contamination of the peritoneum. Hospital course complicated by DKA, AKI, acute thrombocytopenia, and C. glabrata candidemia. Patient remains with high NGT d/t bowel edema as demonstrated on UGI. TPN started on 11/30 but was stopped shortly thereafter to facilitate central line holiday for candidemia. Repeat blood cultures remain negative to date, and TPN now restarting with replacement of central line. Patient at risk for refeeding given prolonged lack of nutrition.  Glucose / Insulin: Hx DM; prev required D10 W infusion to maintain euglycemia  Current resistant Novolog scale q6h (12/5)   CBGs ( goal <150)  range 70-131, 3 units of Novolog  past 24 hrs, 35 units of regular insulin added to TPN  Electrolytes: WNL except K now 5.2 on Bactrim (12/24) Renal: AKI on admit, SCr now WNL  LFTs / TGs: WNL except Alk phos up to 369, Trig 153 (12/21) Prealbumin / albumin: both low (11/30), 5.2 (12/4). 5.3 ( 12/7)  7.7 (12/14), 12.7 (12/21) MIVF: NS at 10 ml/hr GI Imaging: 11/30 UGI: high NG output & bowel edema 12/15 CTpersistent leak noted on CT scan with several fluid collections in the ventral abdomen and in pelvis.  Surgeries / Procedures: 11/22 ex lap: oversew of duodenal ulcer with [name] patch 12/16 IR placed drain into ant abd collection  Central access: PICC replaced 12/3  TPN start date:  resumed 12/3 (~ 2200)  Nutritional Goals (per RD recommendation on 12/11): Kcal:  2025-2330 kcal Protein:  115-130 grams Fluid:  >/= 2 L/day Goal TPN rate is 90 mL/hr (provides 129 g of protein and 2047 kcals per day) - GIR 3.3 mg/kg/min Current Nutrition:  NPO  Plan: at 1800  Continue custom TPN at 90 mL/hr (goal rate)   Electrolytes in TPN:  56mq/L Na, no K, no Ca, Magnesium 10 mEq/L , and 10 mmol/L of Phos; continue Cl:Ac ratio 1:2  Standard MVI TPN on MWF only d/t nProducer, television/film/video Daily trace elements  Continue resistant SSI with q6h CBG checks and adjust as needed   Decrease Novolog 30 units in TPN  [facility]   Monitor TPN labs on Mon/Thurs.  Check Bmet in am.  MNetta Cedars PharmD, BCPS 03/06/2019 12:38 PM

## 2019-03-06 NOTE — Progress Notes (Signed)
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  She was found to have perforated gastric ulcer with pneumoperitoneum, also had evidence DKA.  She was intubated and admitted to ICU.  Started on broad-spectrum antibiotics, pressors, IV fluids.  She underwent expiratory laparotomy 11/23 with finding of perforated prepyloric ulcer which was repaired, contaminated peritoneal fluid was drained.  She was extubated on 11/29.  She had extensive complicated hospital course with DKA, AKI, acute blood loss anemia, thrombocytopenia, intra-abdominal infection, bacteremia, fungemia.    General surgery closely following.  Currently on IV antibiotics.  Abdominal CT done on 12/15 showed persistent abdominal collection,leak.  IR placed left lower quadrant anterior peritoneal drain catheter on 02/26/19.Restarted on eraxis after aerobic/anaerobic culture showed yeast. On 03/03/19, she had multiple bloody bowel movements including bloody output from the JP drain.  GI consulted.  CT angiogram of the abdomen did not show any active bleeding but showed persistent large complex anterior peritoneal fluid collection.  12/24: Hemodynamically stable.  Looks very weak, drowsy.  Ongoing discussion with family from palliative care about goals of care/hospice/palliative approach. We are recommending comfort care. If family decides against comfort care, then we will plan to discharge her to [facility]  if she continues to remain hemodynamically stable.We also need surgical clearance for discharge.  Assessment & Plan:   Principal Problem:   Septic shock ([place]) Active Problems:   DKA  (diabetic ketoacidoses) ([place])   Severe protein-calorie malnutrition ([place])   Acute kidney injury ([place])   Pressure injury of skin   Perforated gastric ulcer ([place])   Acute respiratory failure ([place])   Duodenal perforation (H[facility])   Below-knee amputee ([place])   Atherosclerosis of native arteries of extremities with gangrene, right leg (HCC)   Atherosclerosis of native arteries of extremities with gangrene, left leg (HCC)   Intra-abdominal fluid collection   Septic shock due to ruptured gastric ulcer with acute peritonitis, fungemia, UTI: S/pex lap with oversew of duodenal ulcer,with Graham patch 11/23,DrLucia Gaskins, for perforated prepyloric ulcer with gross contamination.General surgery following. ID was consulted and following. Blood cultures had shown Candida glabrata and she completed the course for fungemia.  Urine culture showed Klebsiella pneumoniae.  Peritoneal fluid culture showed Candida albicans. No evidence of fungal endophthalmitis after ophthalmology consultation.. CT abdomen done on 12/15 and 12/21 showed persistent large  fluid collection on the anterior abdomen,possible leak.IR placed left lower quadrant anterior peritoneal drain catheter on 12/16.[name] Aerobic/anaerobic culture now again showed candida glabrata Enterococcus faecalis, stenotrophomonas.  Restarted on Eraxis.Currently on unasyn and bactrim .She has a RLQ JP drain with suction and midline surgical wound.Has NG tube  Concern for GI bleed: On 12/21 she had multiple bloody bowel movements and there was output of bloody content in the JP drain.  GI consulted.  CT angio did not show any active bleeding.Hemoglobin dropped to 6 so she was transfused with 2 units of PRBC on 03/05/19.Hb in the range of 9 today.  Moderate right pleural effusion: Atelectasis of the right lower lobe ith  decreased air entry on the right side.  Underwent thoracentesis on 12/22 with removal of 700 mL of fluid.  On 3 to 4 L of oxygen per minute.  Ischemic  changes at bilateral BKA site: Possibly secondary to severe septic shock.  She was seen by vascular surgery, orthopedics.  She will eventually need above-knee amputation when she is more stable.  Patient declined surgery at this time so continue current medical treatment  Acute kidney injury: Back to baseline now  ICU delirium: Secondary to infectious etiology, hospital admission, ICU environment.  Mental status has improved and she is currently alert and oriented.  DKA with severely uncontrolled hyperglycemia: Resolved.  Hemoglobin A1c of 15.2 as per 11/23.  Continue current insulin regimen  Severe protein calorie malnutrition with hypoalbuminemia: On TPN.   Dietitian following  Anemia due to acute blood loss, critical illness:  Total of 5 units so far this hospitalization.  Continue to monitor CBC  Acute thrombocytopenia: Secondary to septic shock.  Resolved  Status post bilateral BKA, left phalanx amputation: Hand surgery and orthopedics were following.  Hep C: Positive Ab. HCV RNA of 13,700,000   Debility/deconditioning/poor prognosis: Extremely sick patient.  Complex hospital course.  Palliative  care was consulted for goals of care discussion.  DNR now.  We recommend comfort care/hospice.  Ongoing discussion with family.   Pressure Injury 12/26/18 Buttocks Left Stage II -  Partial thickness loss of dermis presenting as a shallow open ulcer with a red, pink wound bed without slough. (Active)  12/26/18 0630  Location: Buttocks  Location Orientation: Left  Staging: Stage II -  Partial thickness loss of dermis presenting as a shallow open ulcer with a red, pink wound bed without slough.  Wound Description (Comments):   Present on Admission: Yes     Pressure Injury 12/26/18 Buttocks Right;Lower Stage II -  Partial thickness loss of dermis presenting as a shallow open ulcer with a red, pink wound bed without slough. (Active)  12/26/18 0630  Location: Buttocks  Location Orientation:  Right;Lower  Staging: Stage II -  Partial thickness loss of dermis presenting as a shallow open ulcer with a red, pink wound bed without slough.  Wound Description (Comments):   Present on Admission: Yes     Pressure Injury 02/03/19 Coccyx Right;Left;Medial Unstageable - Full thickness tissue loss in which the base of the ulcer is covered by slough (yellow, tan, gray, green or brown) and/or eschar (tan, brown or black) in the wound bed. pt has 3 small injuri (Active)  02/03/19 0230  Location: Coccyx  Location Orientation: Right;Left;Medial  Staging: Unstageable - Full thickness tissue loss in which the base of the ulcer is covered by slough (yellow, tan, gray, green or brown) and/or eschar (tan, brown or black) in the wound bed.  Wound Description (Comments): pt has 3 small injuries all in the same area, one is medial and two are on either side of it  Present on Admission: Yes           Nutrition Problem: Inadequate oral intake Etiology: altered GI function(pyloric channel ulcer perforation s/p ex lap)      DVT prophylaxis:Heparin [place] Code Status: Full Family Communication: Discussed with daughter on phone on 03/04/19 Disposition Plan: She has a bed in [facility].  Not stable for discharge .  Recommend hospice/comfort care .  Ongoing discussion.  Consultants: General surgery, PCCM, ID  Procedures: Exploratory laparotomy  Antimicrobials:  Anti-infectives (From admission, onward)   Start     Dose/Rate Route Frequency Ordered Stop   03/03/19 1600  sulfamethoxazole-trimethoprim (BACTRIM) 200 mg of trimethoprim in dextrose 5 % 250 mL IVPB     200 mg of trimethoprim 262.5 mL/hr over  60 Minutes Intravenous Every 6 hours 03/03/19 1457 03/18/19 0159   03/01/19 1800  Ampicillin-Sulbactam (UNASYN) 3 g in sodium chloride 0.9 % 100 mL IVPB     3 g 200 mL/hr over 30 Minutes Intravenous Every 8 hours 03/01/19 1304 03/17/19 2359   02/28/19 1600  anidulafungin (ERAXIS) 100 mg in sodium  chloride 0.9 % 100 mL IVPB     100 mg 78 mL/hr over 100 Minutes Intravenous Every 24 hours 02/27/19 1346 03/17/19 2359   02/27/19 1600  anidulafungin (ERAXIS) 200 mg in sodium chloride 0.9 % 200 mL IVPB     200 mg 78 mL/hr over 200 Minutes Intravenous  Once 02/27/19 1346 02/27/19 1840   02/18/19 1800  piperacillin-tazobactam (ZOSYN) IVPB 3.375 g  Status:  Discontinued     3.375 g 12.5 mL/hr over 240 Minutes Intravenous Every 8 hours 02/18/19 1652 03/01/19 1259   02/05/19 1000  anidulafungin (ERAXIS) 100 mg in sodium chloride 0.9 % 100 mL IVPB  Status:  Discontinued     100 mg 78 mL/hr over 100 Minutes Intravenous Every 24 hours 02/04/19 0840 02/25/19 1604   02/04/19 1000  anidulafungin (ERAXIS) 200 mg in sodium chloride 0.9 % 200 mL IVPB     200 mg 78 mL/hr over 200 Minutes Intravenous  Once 02/04/19 0840 02/04/19 1301   02/04/19 0200  fluconazole (DIFLUCAN) IVPB 200 mg  Status:  Discontinued     200 mg 100 mL/hr over 60 Minutes Intravenous Every 24 hours 02/02/19 2302 02/04/19 0840   02/03/19 1800  vancomycin (VANCOCIN) IVPB 750 mg/150 ml premix  Status:  Discontinued     750 mg 150 mL/hr over 60 Minutes Intravenous Every 24 hours 02/03/19 0241 02/03/19 0242   02/03/19 1800  vancomycin (VANCOCIN) IVPB 750 mg/150 ml premix  Status:  Discontinued     750 mg 150 mL/hr over 60 Minutes Intravenous Every 24 hours 02/03/19 0242 02/03/19 0858   02/03/19 0600  piperacillin-tazobactam (ZOSYN) IVPB 3.375 g     3.375 g 12.5 mL/hr over 240 Minutes Intravenous Every 8 hours 02/03/19 0243 02/17/19 0205   02/02/19 2315  fluconazole (DIFLUCAN) IVPB 400 mg     400 mg 100 mL/hr over 120 Minutes Intravenous STAT 02/02/19 2301 02/03/19 0218   02/02/19 1800  ceFEPIme (MAXIPIME) 2 g in sodium chloride 0.9 % 100 mL IVPB     2 g 200 mL/hr over 30 Minutes Intravenous  Once 02/02/19 1745 02/02/19 2030   02/02/19 1800  metroNIDAZOLE (FLAGYL) IVPB 500 mg     500 mg 100 mL/hr over 60 Minutes Intravenous  Once  02/02/19 1745 02/02/19 2237   02/02/19 1800  vancomycin (VANCOCIN) IVPB 1000 mg/200 mL premix  Status:  Discontinued     1,000 mg 200 mL/hr over 60 Minutes Intravenous  Once 02/02/19 1745 02/02/19 1747   02/02/19 1800  vancomycin (VANCOCIN) 1,250 mg in sodium chloride 0.9 % 250 mL IVPB     1,250 mg 166.7 mL/hr over 90 Minutes Intravenous  Once 02/02/19 1747 02/02/19 2237      Subjective: Patient seen and examined the bedside this morning.  Hemodynamically stable but looks very lethargic, weak.  Bloody output from the JP drain continues.  There is also dark output from her NG tube.  Complains of abdominal discomfort.  Objective: Vitals:   03/06/19 0800 03/06/19 0900 03/06/19 1000 03/06/19 1100  BP: 128/78 134/62 134/62 128/60  Pulse: 84  90 91  Resp: 11 11 11 13   Temp: (!) 97.5 F (36.4  C)     TempSrc: Axillary     SpO2:      Weight:      Height:        Intake/Output Summary (Last 24 hours) at 03/06/2019 1257 Last data filed at 03/06/2019 0500 Gross per 24 hour  Intake 3068.38 ml  Output 3225 ml  Net -156.62 ml   Filed Weights   03/01/19 0500 03/02/19 0453 03/03/19 0500  Weight: 51.7 kg 51.7 kg 53.3 kg    Examination:  General exam: Extremely Deconditioned, debilitated, weak HEENT:PERRL, Ear/Nose normal on gross exam,NG tube Respiratory system: Decreased air entry on the right side Cardiovascular system: S1 & S2 heard, RRR. No JVD, murmurs, rubs, gallops or clicks.  Gastrointestinal system: Abdomen is mildly distended, soft ,mild generalized tenderness. Bowel sounds not heard . Midline abdominal surgical wound, right lower quadrant and left lower quadrant drains  Central nervous system: lethargic  extremities: Bilateral BKA.  Amputation site covered with dressing, amputation of left middle finger Skin: No rashes, lesions no icterus ,no pallor.  Unstageable sacral wound   Data Reviewed: I have personally reviewed following labs and imaging studies  CBC: Recent Labs   Lab 02/28/19 0148 03/03/19 0201 03/04/19 0210 03/05/19 0207 03/05/19 1713 03/06/19 1019 03/06/19 1138  WBC 9.4 9.2 14.5* 13.2* 9.9 SPECIMEN CONTAMINATED, UNABLE TO PERFORM TEST(S). 10.6*  NEUTROABS 5.9 5.9  --  9.9*  --  SPECIMEN CONTAMINATED, UNABLE TO PERFORM TEST(S). 6.8  HGB 9.5* 8.8* 7.6* 6.2* 9.0* SPECIMEN CONTAMINATED, UNABLE TO PERFORM TEST(S). 9.5*  HCT 31.0* 29.5* 25.8* 20.8* 27.6* SPECIMEN CONTAMINATED, UNABLE TO PERFORM TEST(S). 29.9*  MCV 95.1 95.8 98.9 96.3 94.5 SPECIMEN CONTAMINATED, UNABLE TO PERFORM TEST(S). 92.0  PLT 651* 655* 557* 450* 380 SPECIMEN CONTAMINATED, UNABLE TO PERFORM TEST(S). 474   Basic Metabolic Panel: Recent Labs  Lab 03/03/19 0201 03/05/19 0207 03/06/19 1019 03/06/19 1138  NA 143 139  --  134*  K 4.4 4.9  --  5.2*  CL 111 110  --  104  CO2 25 21*  --  24  GLUCOSE 133* 111*  --  108*  BUN 44* 60*  --  46*  CREATININE 0.54 0.85  --  0.74  CALCIUM 9.2 8.5*  --  8.2*  MG 2.2  --  SPECIMEN CONTAMINATED, UNABLE TO PERFORM TEST(S). 2.2  PHOS 3.5  --  SPECIMEN CONTAMINATED, UNABLE TO PERFORM TEST(S). 3.6   GFR: Estimated Creatinine Clearance: 61.4 mL/min (by C-G formula based on SCr of 0.74 mg/dL). Liver Function Tests: Recent Labs  Lab 03/03/19 0201 03/06/19 1138  AST 35 38  ALT 33 38  ALKPHOS 369* 293*  BILITOT 0.6 0.3  PROT 6.9 6.6  ALBUMIN 1.5* 1.4*   No results for input(s): LIPASE, AMYLASE in the last 168 hours. No results for input(s): AMMONIA in the last 168 hours. Coagulation Profile: No results for input(s): INR, PROTIME in the last 168 hours. Cardiac Enzymes: No results for input(s): CKTOTAL, CKMB, CKMBINDEX, TROPONINI in the last 168 hours. BNP (last 3 results) No results for input(s): PROBNP in the last 8760 hours. HbA1C: No results for input(s): HGBA1C in the last 72 hours. CBG: Recent Labs  Lab 03/05/19 0606 03/05/19 1137 03/05/19 1750 03/05/19 2320 03/06/19 0542  GLUCAP 131* 70 90 115* 131*   Lipid  Profile: No results for input(s): CHOL, HDL, LDLCALC, TRIG, CHOLHDL, LDLDIRECT in the last 72 hours. Thyroid Function Tests: No results for input(s): TSH, T4TOTAL, FREET4, T3FREE, THYROIDAB in the last 72 hours. Anemia Panel:  No results for input(s): VITAMINB12, FOLATE, FERRITIN, TIBC, IRON, RETICCTPCT in the last 72 hours. Sepsis Labs: No results for input(s): PROCALCITON, LATICACIDVEN in the last 168 hours.  Recent Results (from the past 240 hour(s))  Aerobic/Anaerobic Culture (surgical/deep wound)     Status: None   Collection Time: 02/26/19  3:31 PM   Specimen: Abscess  Result Value Ref Range Status   Specimen Description   Final    ABSCESS LLQ CT DRAIN CATHETER Performed at [facility], [address], [place], [place] [address]    Special Requests   Final    Normal Performed at [facility], [place] [address] [place], [place] [address]    Gram Stain   Final    MODERATE WBC PRESENT,BOTH PMN AND MONONUCLEAR FEW YEAST    Culture   Final    FEW ENTEROCOCCUS FAECALIS FEW STENOTROPHOMONAS MALTOPHILIA MODERATE CANDIDA GLABRATA NO ANAEROBES ISOLATED Performed at [facility], [place] [address] [place], [place] [address]    Report Status 03/03/2019 FINAL  Final   Organism ID, Bacteria ENTEROCOCCUS FAECALIS  Final   Organism ID, Bacteria STENOTROPHOMONAS MALTOPHILIA  Final      Susceptibility   Enterococcus faecalis - MIC*    AMPICILLIN <=2 SENSITIVE Sensitive     VANCOMYCIN >=32 RESISTANT Resistant     GENTAMICIN SYNERGY SENSITIVE Sensitive     LINEZOLID 2 SENSITIVE Sensitive     * FEW ENTEROCOCCUS FAECALIS   Stenotrophomonas maltophilia - MIC*    LEVOFLOXACIN >=8 RESISTANT Resistant     TRIMETH/SULFA <=20 SENSITIVE Sensitive     * FEW STENOTROPHOMONAS MALTOPHILIA  Body fluid culture     Status: None (Preliminary result)   Collection Time: 03/04/19 12:54 PM   Specimen: Lung, Right; Pleural Fluid  Result Value Ref Range  Status   Specimen Description   Final    PLEURAL RIGHT Performed at [place] [address] [place], [place] [address]    Special Requests   Final    NONE Performed at [facility], [place] [address] [place], [place] [address]    Gram Stain   Final    RARE WBC PRESENT,BOTH PMN AND MONONUCLEAR NO ORGANISMS SEEN    Culture   Final    NO GROWTH 2 DAYS Performed at [facility], [place] [address], [place],  [address]    Report Status PENDING  Incomplete         Radiology Studies: DG Chest Port 1 View  Result Date: 03/04/2019 CLINICAL DATA:  Right-sided thoracentesis. EXAM: PORTABLE CHEST 1 VIEW COMPARISON:  CTA of the abdomen and pelvis 03/03/2019 FINDINGS: The heart size is normal. Previously noted right pleural effusion significantly reduced. There is no pneumothorax. Heart size is normal. Small left effusion remains. Right-sided PICC line is in place. NG tube terminates in the stomach. IMPRESSION: 1. Marked decrease in right pleural effusion. 2. Small left pleural effusion. 3. No pneumothorax. 4. The support apparatus is stable. Electronically Signed   By: San Morelle M.D.   On: 03/04/2019 13:40   ECHOCARDIOGRAM COMPLETE  Result Date: 03/04/2019   ECHOCARDIOGRAM REPORT   Patient Name:   MCKAY BRANDT Date of Exam: 03/04/2019 Medical Rec #:  [id number]       Height:       62.0 in Accession #:    [id number]      Weight:       117.5 lb Date of Birth:  1961/01/11  BSA:          1.52 m Patient Age:    32 years        BP:           119/57 mmHg Patient Gender: F               HR:           85 bpm. Exam Location:  Inpatient Procedure: 2D Echo, Cardiac Doppler and Color Doppler Indications:    Congestive Heart Failure 428.0  History:        Patient has no prior history of Echocardiogram examinations.                 Risk Factors:Hypertension, Diabetes and Non-Smoker. Septic                 shock.  Sonographer:    Paulita Fujita RDCS Referring Phys: [id number] Markeisha Mancias IMPRESSIONS  1. Left ventricular ejection fraction, by visual estimation, is 60 to 65%. The left ventricle has normal function. There is no left ventricular hypertrophy.  2. The left ventricle demonstrates regional wall motion abnormalities.  3. Global right ventricle has normal systolic function.The right ventricular size is normal. No increase in right ventricular wall thickness.  4. Left atrial size was normal.  5. Right atrial size was normal.  6. The mitral valve is normal in structure. No evidence of mitral valve regurgitation. No evidence of mitral stenosis.  7. The tricuspid valve is normal in structure. Tricuspid valve regurgitation is not demonstrated.  8. The aortic valve is normal in structure. Aortic valve regurgitation is not visualized. No evidence of aortic valve sclerosis or stenosis.  9. The pulmonic valve was normal in structure. Pulmonic valve regurgitation is not visualized. 10. TR signal is inadequate for assessing pulmonary artery systolic pressure. 11. The inferior vena cava is normal in size with greater than 50% respiratory variability, suggesting right atrial pressure of 3 mmHg. FINDINGS  Left Ventricle: Left ventricular ejection fraction, by visual estimation, is 60 to 65%. The left ventricle has normal function. The left ventricle demonstrates regional wall motion abnormalities. The left ventricular internal cavity size was the left ventricle is normal in size. There is no left ventricular hypertrophy. Left ventricular diastolic parameters were normal. Normal left atrial pressure. Right Ventricle: The right ventricular size is normal. No increase in right ventricular wall thickness. Global RV systolic function is has normal systolic function. Left Atrium: Left atrial size was normal in size. Right Atrium: Right atrial size was normal in size Pericardium: There is no evidence of pericardial effusion. Mitral Valve: The mitral valve is  normal in structure. No evidence of mitral valve regurgitation. No evidence of mitral valve stenosis by observation. Tricuspid Valve: The tricuspid valve is normal in structure. Tricuspid valve regurgitation is not demonstrated. Aortic Valve: The aortic valve is normal in structure. Aortic valve regurgitation is not visualized. The aortic valve is structurally normal, with no evidence of sclerosis or stenosis. Pulmonic Valve: The pulmonic valve was normal in structure. Pulmonic valve regurgitation is not visualized. Pulmonic regurgitation is not visualized. Aorta: The aortic root, ascending aorta and aortic arch are all structurally normal, with no evidence of dilitation or obstruction. Venous: The inferior vena cava is normal in size with greater than 50% respiratory variability, suggesting right atrial pressure of 3 mmHg. IAS/Shunts: No atrial level shunt detected by color flow Doppler. There is no evidence of a patent foramen ovale. No ventricular septal defect is seen or  detected. There is no evidence of an atrial septal defect.  LEFT VENTRICLE PLAX 2D LVIDd:         4.40 cm  Diastology LVIDs:         3.20 cm  LV e' lateral:   11.50 cm/s LV PW:         0.80 cm  LV E/e' lateral: 6.6 LV IVS:        0.80 cm  LV e' medial:    7.94 cm/s LVOT diam:     1.70 cm  LV E/e' medial:  9.6 LV SV:         47 ml LV SV Index:   30.55 LVOT Area:     2.27 cm  RIGHT VENTRICLE RV S prime:     12.30 cm/s TAPSE (M-mode): 2.0 cm LEFT ATRIUM             Index       RIGHT ATRIUM          Index LA diam:        2.70 cm 1.77 cm/m  RA Area:     9.64 cm LA Vol (A2C):   22.4 ml 14.69 ml/m RA Volume:   16.40 ml 10.75 ml/m LA Vol (A4C):   20.3 ml 13.31 ml/m LA Biplane Vol: 21.6 ml 14.16 ml/m  AORTIC VALVE LVOT Vmax:   118.00 cm/s LVOT Vmean:  80.800 cm/s LVOT VTI:    0.208 m  AORTA Ao Root diam: 2.70 cm MITRAL VALVE MV Area (PHT): 3.93 cm             SHUNTS MV PHT:        55.97 msec           Systemic VTI:  0.21 m MV Decel Time: 193 msec              Systemic Diam: 1.70 cm MV E velocity: 76.30 cm/s 103 cm/s MV A velocity: 93.00 cm/s 70.3 cm/s MV E/A ratio:  0.82       1.5  Mihai Croitoru MD Electronically signed by Sanda Klein MD Signature Date/Time: 03/04/2019/4:31:24 PM    Final    US THORACENTESIS ASP PLEURAL SPACE W/IMG GUIDE  Result Date: 03/04/2019 INDICATION: Patient with history of septic shock due to ruptured gastric ulcer with peritonitis, abdominal abscess, right pleural effusion; request received for diagnostic and therapeutic right thoracentesis. EXAM: ULTRASOUND GUIDED DIAGNOSTIC AND THERAPEUTIC RIGHT THORACENTESIS MEDICATIONS: None COMPLICATIONS: None immediate. PROCEDURE: An ultrasound guided thoracentesis was thoroughly discussed with the patient's daughter and questions answered. The benefits, risks, alternatives and complications were also discussed. The patient understands and wishes to proceed with the procedure. Written consent was obtained. Ultrasound was performed to localize and mark an adequate pocket of fluid in the right chest. The area was then prepped and draped in the normal sterile fashion. 1% Lidocaine was used for local anesthesia. Under ultrasound guidance a 6 Fr Safe-T-Centesis catheter was introduced. Thoracentesis was performed. The catheter was removed and a dressing applied. FINDINGS: A total of approximately 710 cc of yellow fluid was removed. Samples were sent to the laboratory as requested by the clinical team. IMPRESSION: Successful ultrasound guided diagnostic and therapeutic right thoracentesis yielding 710 cc of pleural fluid. Read by: Rowe Robert, PA-C Electronically Signed   By: Jacqulynn Cadet M.D.   On: 03/04/2019 12:43        Scheduled Meds: . sodium chloride   Intravenous Once  . sodium chloride   Intravenous Once  .  chlorhexidine gluconate (MEDLINE KIT)  15 mL Mouth Rinse BID  . Chlorhexidine Gluconate Cloth  6 each Topical Daily  . collagenase   Topical Daily  . Gerhardt's  butt cream   Topical TID  . insulin aspart  0-20 Units Subcutaneous Q6H  . metoprolol tartrate  2.5 mg Intravenous Q6H  . pantoprazole (PROTONIX) IV  40 mg Intravenous Q12H  . sodium chloride flush  10-40 mL Intracatheter Q12H  . sodium chloride flush  5 mL Intracatheter Q8H   Continuous Infusions: . sodium chloride 10 mL/hr at 03/04/19 0558  . sodium chloride 10 mL/hr at 03/04/19 0202  . ampicillin-sulbactam (UNASYN) IV Stopped (03/06/19 1158)  . anidulafungin 100 mg (03/05/19 1715)  . sulfamethoxazole-trimethoprim Stopped (03/06/19 1040)  . TPN ADULT (ION) 90 mL/hr at 03/05/19 1728     LOS: 32 days    Time spent: 35 mins.More than 50% of that time was spent in counseling and/or coordination of care.      Shelly Coss, MD [facility] Pager [phone]  If 7PM-7AM, please contact night-coverage [email or web address] Password [email or web address] [facility] 03/06/2019, 12:57 PM

## 2019-03-07 LAB — CBC WITH DIFFERENTIAL/PLATELET
Abs Immature Granulocytes: 0.53 10*3/uL — ABNORMAL HIGH (ref 0.00–0.07)
Basophils Absolute: 0 10*3/uL (ref 0.0–0.1)
Basophils Relative: 0 %
Eosinophils Absolute: 0.2 10*3/uL (ref 0.0–0.5)
Eosinophils Relative: 2 %
HCT: 31.5 % — ABNORMAL LOW (ref 36.0–46.0)
Hemoglobin: 9.8 g/dL — ABNORMAL LOW (ref 12.0–15.0)
Immature Granulocytes: 5 %
Lymphocytes Relative: 17 %
Lymphs Abs: 1.7 10*3/uL (ref 0.7–4.0)
MCH: 29.2 pg (ref 26.0–34.0)
MCHC: 31.1 g/dL (ref 30.0–36.0)
MCV: 93.8 fL (ref 80.0–100.0)
Monocytes Absolute: 1.4 10*3/uL — ABNORMAL HIGH (ref 0.1–1.0)
Monocytes Relative: 14 %
Neutro Abs: 6 10*3/uL (ref 1.7–7.7)
Neutrophils Relative %: 62 %
Platelets: 367 10*3/uL (ref 150–400)
RBC: 3.36 MIL/uL — ABNORMAL LOW (ref 3.87–5.11)
RDW: 14.8 % (ref 11.5–15.5)
WBC: 9.8 10*3/uL (ref 4.0–10.5)
nRBC: 0 % (ref 0.0–0.2)

## 2019-03-07 LAB — BODY FLUID CULTURE: Culture: NO GROWTH

## 2019-03-07 LAB — BASIC METABOLIC PANEL
Anion gap: 10 (ref 5–15)
BUN: 42 mg/dL — ABNORMAL HIGH (ref 6–20)
CO2: 20 mmol/L — ABNORMAL LOW (ref 22–32)
Calcium: 8.6 mg/dL — ABNORMAL LOW (ref 8.9–10.3)
Chloride: 103 mmol/L (ref 98–111)
Creatinine, Ser: 0.72 mg/dL (ref 0.44–1.00)
GFR calc Af Amer: >60 mL/min (ref >60)
GFR calc non Af Amer: >60 mL/min (ref >60)
Glucose, Bld: 97 mg/dL (ref 70–99)
Potassium: 4.6 mmol/L (ref 3.5–5.1)
Sodium: 133 mmol/L — ABNORMAL LOW (ref 135–145)

## 2019-03-07 LAB — GLUCOSE, CAPILLARY
Glucose-Capillary: 120 mg/dL — ABNORMAL HIGH (ref 70–99)
Glucose-Capillary: 194 mg/dL — ABNORMAL HIGH (ref 70–99)
Glucose-Capillary: 136 mg/dL — ABNORMAL HIGH (ref 70–99)
Glucose-Capillary: 159 mg/dL — ABNORMAL HIGH (ref 70–99)

## 2019-03-07 MED ORDER — TRAVASOL 10 % IV SOLN
INTRAVENOUS | Status: AC
  Filled 2019-03-07: qty 1296

## 2019-03-07 NOTE — Progress Notes (Signed)
PHARMACY - TOTAL PARENTERAL NUTRITION CONSULT NOTE   Indication: Small bowel obstruction  Patient Measurements: Height: 5' 2"  (157.5 cm) Weight: 117 lb 8.1 oz (53.3 kg) IBW/kg (Calculated) : 50.1 TPN AdjBW (KG): 57.9 Body mass index is 21.49 kg/m. Usual Weight: 55-60 kg Ideal Body Weight:  44.2 kg(Adjusted IBW for bilateral BKA)  Assessment:  89 yoF with poorly controlled DM s/p BL BKA, admitted 11/23 with AMS, n/v, abdominal pain. CT abdomen showed pneumoperitoneum and mesenteric ischemia. Underwent ex lap on 11/23 with finding of perforated prepyloric ulcer which was oversewn, along with gross contamination of the peritoneum. Hospital course complicated by DKA, AKI, acute thrombocytopenia, and C. glabrata candidemia. Patient remains with high NGT d/t bowel edema as demonstrated on UGI. TPN started on 11/30 but was stopped shortly thereafter to facilitate central line holiday for candidemia. Repeat blood cultures remain negative to date, and TPN now restarting with replacement of central line. Patient at risk for refeeding given prolonged lack of nutrition.  Glucose / Insulin: Hx DM; prev required D10 W infusion to maintain euglycemia  Current resistant Novolog scale q6h (12/5)   CBGs ( goal <150)  range 120-163, 7 units of Novolog  past 24 hrs, 30 units of regular insulin added to TPN  Electrolytes: WNL except Na now low at 133  (12/25) Renal: AKI on admit, SCr now WNL  LFTs / TGs: WNL except Alk phos up to 369, Trig 153 (12/21) Prealbumin / albumin: both low (11/30), 5.2 (12/4). 5.3 ( 12/7)  7.7 (12/14), 12.7 (12/21) MIVF: NS at 10 ml/hr GI Imaging: 11/30 UGI: high NG output & bowel edema 12/15 CTpersistent leak noted on CT scan with several fluid collections in the ventral abdomen and in pelvis.  Surgeries / Procedures: 11/22 ex lap: oversew of duodenal ulcer with [name] patch 12/16 IR placed drain into ant abd collection  Central access: PICC replaced 12/3  TPN start date:  resumed 12/3 (~ 2200)  Nutritional Goals (per RD recommendation on 12/11): Kcal:  2025-2330 kcal Protein:  115-130 grams Fluid:  >/= 2 L/day Goal TPN rate is 90 mL/hr (provides 129 g of protein and 2047 kcals per day) - GIR 3.3 mg/kg/min Current Nutrition:  NPO  Plan: at 1800  Continue custom TPN at 90 mL/hr (goal rate)   Electrolytes in TPN:  Increase 70mq/L Na, no K, no Ca, Magnesium 10 mEq/L , and 10 mmol/L of Phos; change Cl:Ac ratio 1:1  Standard MVI TPN on MWF only d/t nProducer, television/film/video Daily trace elements  Continue resistant SSI with q6h CBG checks and adjust as needed   Continue Novolog 30 units in TPN  [facility]   Monitor TPN labs on Mon/Thurs.  Check Bmet in am.  MNetta Cedars PharmD, BCPS 03/07/2019 7:42 AM

## 2019-03-07 NOTE — Progress Notes (Signed)
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  She was found to have perforated gastric ulcer with pneumoperitoneum, also had evidence DKA.  She was intubated and admitted to ICU.  Started on broad-spectrum antibiotics, pressors, IV fluids.  She underwent expiratory laparotomy 11/23 with finding of perforated prepyloric ulcer which was repaired, contaminated peritoneal fluid was drained.  She was extubated on 11/29.  She had extensive complicated hospital course with DKA, AKI, acute blood loss anemia, thrombocytopenia, intra-abdominal infection, bacteremia, fungemia.    General surgery closely following.  Currently on IV antibiotics.  Abdominal CT done on 12/15 showed persistent abdominal collection,leak.  IR placed left lower quadrant anterior peritoneal drain catheter on 02/26/19.Restarted on eraxis after aerobic/anaerobic culture showed yeast. On 03/03/19, she had multiple bloody bowel movements including bloody output from the JP drain.  GI consulted.  CT angiogram of the abdomen did not show any active bleeding but showed persistent large complex anterior peritoneal fluid collection.  12/24: Hemodynamically stable.  Looks much better today, comfortable.  Mental status has significantly improved today and she is alert and awake, communicative and oriented.  We were recommending comfort care. If family decides against comfort care, then we will plan to discharge her to [facility]  if she continues to remain hemodynamically stable.We also need surgical clearance for discharge.  We will follow-up with case manager if that can happen in the  weekend.  Assessment & Plan:   Principal Problem:   Septic shock ([place]) Active Problems:   DKA (diabetic ketoacidoses) ([place])   Severe protein-calorie malnutrition ([place])   Acute kidney injury ([place])   Pressure injury of skin   Perforated gastric ulcer ([place])   Acute respiratory failure ([place])   Duodenal perforation (H[facility])   Below-knee amputee ([place])   Atherosclerosis of native arteries of extremities with gangrene, right leg (HCC)   Atherosclerosis of native arteries of extremities with gangrene, left leg (HCC)   Intra-abdominal fluid collection   Septic shock due to ruptured gastric ulcer with acute peritonitis, fungemia, UTI: S/pex lap with oversew of duodenal ulcer,with Graham patch 11/23,DrLucia Gaskins, for perforated prepyloric ulcer with gross contamination.General surgery following. ID was consulted and following. Blood cultures had shown Candida glabrata and she completed the course for fungemia.  Urine culture showed Klebsiella pneumoniae.  Peritoneal fluid culture showed Candida albicans. No evidence of fungal endophthalmitis after ophthalmology consultation.. CT abdomen done on 12/15 and 12/21 showed persistent large  fluid collection on the anterior abdomen,possible leak.IR placed left lower quadrant anterior peritoneal drain catheter on 12/16.[name] Aerobic/anaerobic culture now again showed candida glabrata Enterococcus faecalis, stenotrophomonas.  Restarted on Eraxis.Currently on unasyn and bactrim .She has a RLQ JP drain with suction and midline surgical wound.Has NG tube  Concern for GI bleed: On 12/21 she had multiple bloody bowel movements and there was output of bloody content in the JP drain.  GI consulted.  CT angio did not show any active bleeding.Hemoglobin dropped to 6 so she was transfused with 2 units of PRBC on 03/05/19.Hb in the range of 9 today.  Moderate right pleural effusion: Atelectasis of the right lower lobe ith  decreased air entry on the right side.  Underwent  thoracentesis on 12/22  with removal of 700 mL of fluid.  On 2-3L of oxygen per minute.  Ischemic changes at bilateral BKA site: Possibly secondary to severe septic shock.  She was seen by vascular surgery, orthopedics.  She will eventually need above-knee amputation when she is more stable.  Patient declined surgery at this time so continue current medical treatment  Acute kidney injury: Back to baseline now  ICU delirium: Secondary to infectious etiology, hospital admission, ICU environment.  Mental status has improved and she is currently alert and oriented.  DKA with severely uncontrolled hyperglycemia: Resolved.  Hemoglobin A1c of 15.2 as per 11/23.  Continue current insulin regimen  Severe protein calorie malnutrition with hypoalbuminemia: On TPN.   Dietitian following  Anemia due to acute blood loss, critical illness:  Total of 5 units so far this hospitalization.  Continue to monitor CBC  Acute thrombocytopenia: Secondary to septic shock.  Resolved  Status post bilateral BKA, left phalanx amputation: Hand surgery and orthopedics were following.  Hep C: Positive Ab. HCV RNA of 13,700,000   Debility/deconditioning/poor prognosis: Extremely sick patient.  Complex hospital course.  Palliative  care was consulted for goals of care discussion.  DNR now.  We recommend comfort care/hospice.  Ongoing discussion with family.   Pressure Injury 12/26/18 Buttocks Left Stage II -  Partial thickness loss of dermis presenting as a shallow open ulcer with a red, pink wound bed without slough. (Active)  12/26/18 0630  Location: Buttocks  Location Orientation: Left  Staging: Stage II -  Partial thickness loss of dermis presenting as a shallow open ulcer with a red, pink wound bed without slough.  Wound Description (Comments):   Present on Admission: Yes     Pressure Injury 12/26/18 Buttocks Right;Lower Stage II -  Partial thickness loss of dermis presenting as a shallow open ulcer with a red, pink  wound bed without slough. (Active)  12/26/18 0630  Location: Buttocks  Location Orientation: Right;Lower  Staging: Stage II -  Partial thickness loss of dermis presenting as a shallow open ulcer with a red, pink wound bed without slough.  Wound Description (Comments):   Present on Admission: Yes     Pressure Injury 02/03/19 Coccyx Right;Left;Medial Unstageable - Full thickness tissue loss in which the base of the ulcer is covered by slough (yellow, tan, gray, green or brown) and/or eschar (tan, brown or black) in the wound bed. pt has 3 small injuri (Active)  02/03/19 0230  Location: Coccyx  Location Orientation: Right;Left;Medial  Staging: Unstageable - Full thickness tissue loss in which the base of the ulcer is covered by slough (yellow, tan, gray, green or brown) and/or eschar (tan, brown or black) in the wound bed.  Wound Description (Comments): pt has 3 small injuries all in the same area, one is medial and two are on either side of it  Present on Admission: Yes           Nutrition Problem: Inadequate oral intake Etiology: altered GI function(pyloric channel ulcer perforation s/p ex lap)      DVT prophylaxis:SCD Code Status: Full Family Communication: Discussed with daughter on phone on 03/04/19 Disposition Plan: She has a bed in [facility]. We recommend hospice/comfort care .  Ongoing discussion.  If family decides against hospice/comfort care, will plan to discharge her to [facility]   Consultants: General surgery, PCCM, ID  Procedures: Exploratory laparotomy  Antimicrobials:  Anti-infectives (From admission, onward)   Start     Dose/Rate Route Frequency Ordered Stop   03/03/19 1600  sulfamethoxazole-trimethoprim (BACTRIM) 200 mg of trimethoprim in dextrose 5 % 250 mL IVPB     200 mg of trimethoprim 262.5 mL/hr over 60 Minutes Intravenous Every 6 hours 03/03/19 1457 03/18/19 0159   03/01/19 1800  Ampicillin-Sulbactam (UNASYN) 3 g in sodium chloride 0.9 % 100 mL IVPB      3 g 200 mL/hr over 30 Minutes Intravenous Every 8 hours 03/01/19 1304 03/17/19 2359   02/28/19 1600  anidulafungin (ERAXIS) 100 mg in sodium chloride 0.9 % 100 mL IVPB     100 mg 78 mL/hr over 100 Minutes Intravenous Every 24 hours 02/27/19 1346 03/17/19 2359   02/27/19 1600  anidulafungin (ERAXIS) 200 mg in sodium chloride 0.9 % 200 mL IVPB     200 mg 78 mL/hr over 200 Minutes Intravenous  Once 02/27/19 1346 02/27/19 1840   02/18/19 1800  piperacillin-tazobactam (ZOSYN) IVPB 3.375 g  Status:  Discontinued     3.375 g 12.5 mL/hr over 240 Minutes Intravenous Every 8 hours 02/18/19 1652 03/01/19 1259   02/05/19 1000  anidulafungin (ERAXIS) 100 mg in sodium chloride 0.9 % 100 mL IVPB  Status:  Discontinued     100 mg 78 mL/hr over 100 Minutes Intravenous Every 24 hours 02/04/19 0840 02/25/19 1604   02/04/19 1000  anidulafungin (ERAXIS) 200 mg in sodium chloride 0.9 % 200 mL IVPB     200 mg 78 mL/hr over 200 Minutes Intravenous  Once 02/04/19 0840 02/04/19 1301   02/04/19 0200  fluconazole (DIFLUCAN) IVPB 200 mg  Status:  Discontinued     200 mg 100 mL/hr over 60 Minutes Intravenous Every 24 hours 02/02/19 2302 02/04/19 0840   02/03/19 1800  vancomycin (VANCOCIN) IVPB 750 mg/150 ml premix  Status:  Discontinued     750 mg 150 mL/hr over 60 Minutes Intravenous Every 24 hours 02/03/19 0241 02/03/19 0242   02/03/19 1800  vancomycin (VANCOCIN) IVPB 750 mg/150 ml premix  Status:  Discontinued     750 mg 150 mL/hr over 60 Minutes Intravenous Every 24 hours 02/03/19 0242 02/03/19 0858   02/03/19 0600  piperacillin-tazobactam (ZOSYN) IVPB 3.375 g     3.375 g 12.5 mL/hr over 240 Minutes Intravenous Every 8 hours 02/03/19 0243 02/17/19 0205   02/02/19 2315  fluconazole (DIFLUCAN) IVPB 400 mg     400 mg 100 mL/hr over 120 Minutes Intravenous STAT 02/02/19 2301 02/03/19 0218   02/02/19 1800  ceFEPIme (MAXIPIME) 2 g in sodium chloride 0.9 % 100 mL IVPB     2 g 200 mL/hr over 30 Minutes Intravenous   Once 02/02/19 1745 02/02/19 2030   02/02/19 1800  metroNIDAZOLE (FLAGYL) IVPB 500 mg     500 mg 100 mL/hr over 60 Minutes Intravenous  Once 02/02/19 1745 02/02/19 2237   02/02/19 1800  vancomycin (VANCOCIN) IVPB 1000 mg/200 mL premix  Status:  Discontinued     1,000 mg 200 mL/hr over 60 Minutes Intravenous  Once 02/02/19 1745 02/02/19 1747   02/02/19 1800  vancomycin (VANCOCIN) 1,250 mg in sodium chloride 0.9 % 250 mL IVPB     1,250 mg 166.7 mL/hr over 90 Minutes Intravenous  Once 02/02/19 1747 02/02/19 2237      Subjective: Patient seen and examined the bedside this morning.  Hemodynamically stable.  In a good mood today.  Feels comfortable.  Denies significant abdominal pain.  She was completely alert and oriented today.  Objective: Vitals:   03/07/19 0500 03/07/19 0700 03/07/19 0800 03/07/19 0900  BP: 126/67 117/64 124/68 139/66  Pulse: 82 81 80 85  Resp: 14 13 12 13   Temp:      TempSrc:      SpO2:      Weight:      Height:        Intake/Output Summary (Last 24 hours) at 03/07/2019 1041 Last data filed at 03/07/2019 0650 Gross per 24 hour  Intake 3482.92 ml  Output 2450 ml  Net 1032.92 ml   Filed Weights   03/01/19 0500 03/02/19 0453 03/03/19 0500  Weight: 51.7 kg 51.7 kg 53.3 kg    Examination:  General exam: Extremely Deconditioned, debilitated, weak HEENT:PERRL, Ear/Nose normal on gross exam,NG tube Respiratory system: Decreased air entry on the right side Cardiovascular system: S1 & S2 heard, RRR. No JVD, murmurs, rubs, gallops or clicks.  Gastrointestinal system: Abdomen is mildly distended, soft ,mild generalized tenderness. Bowel sounds not heard . Midline abdominal surgical wound, right lower quadrant and left lower quadrant drains  Central nervous system: lethargic  extremities: Bilateral BKA.  Amputation site covered with dressing, amputation of left middle finger Skin: No rashes, lesions no icterus ,no pallor.  Unstageable sacral wound   Data  Reviewed: I have personally reviewed following labs and imaging studies  CBC: Recent Labs  Lab 03/03/19 0201 03/05/19 0207 03/05/19 1713 03/06/19 1019 03/06/19 1138 03/07/19 0218  WBC 9.2 13.2* 9.9 SPECIMEN CONTAMINATED, UNABLE TO PERFORM TEST(S). 10.6* PENDING  NEUTROABS 5.9 9.9*  --  SPECIMEN CONTAMINATED, UNABLE TO PERFORM TEST(S). 6.8 PENDING  HGB 8.8* 6.2* 9.0* SPECIMEN CONTAMINATED, UNABLE TO PERFORM TEST(S). 9.5* 9.8*  HCT 29.5* 20.8* 27.6* SPECIMEN CONTAMINATED, UNABLE TO PERFORM TEST(S). 29.9* 31.5*  MCV 95.8 96.3 94.5 SPECIMEN CONTAMINATED, UNABLE TO PERFORM TEST(S). 92.0 93.8  PLT 655* 450* 380 SPECIMEN CONTAMINATED, UNABLE TO PERFORM TEST(S). 382 536   Basic Metabolic Panel: Recent Labs  Lab 03/03/19 0201 03/05/19 0207 03/06/19 1019 03/06/19 1138 03/07/19 0218  NA 143 139  --  134* 133*  K 4.4 4.9  --  5.2* 4.6  CL 111 110  --  104 103  CO2 25 21*  --  24 20*  GLUCOSE 133* 111*  --  108* 97  BUN 44* 60*  --  46* 42*  CREATININE 0.54 0.85  --  0.74 0.72  CALCIUM 9.2 8.5*  --  8.2* 8.6*  MG 2.2  --  SPECIMEN CONTAMINATED, UNABLE TO PERFORM TEST(S). 2.2  --   PHOS 3.5  --  SPECIMEN CONTAMINATED, UNABLE TO PERFORM TEST(S). 3.6  --    GFR: Estimated Creatinine Clearance: 61.4 mL/min (by C-G formula based on SCr of 0.72 mg/dL). Liver Function Tests: Recent Labs  Lab 03/03/19 0201 03/06/19 1138  AST 35 38  ALT 33 38  ALKPHOS 369* 293*  BILITOT 0.6 0.3  PROT 6.9 6.6  ALBUMIN 1.5* 1.4*   No results for input(s): LIPASE, AMYLASE in the last 168 hours. No results for input(s): AMMONIA in the last 168 hours. Coagulation Profile: No results for input(s): INR, PROTIME in the last 168 hours. Cardiac Enzymes: No results for input(s): CKTOTAL, CKMB, CKMBINDEX, TROPONINI in the last 168 hours. BNP (last 3 results) No results for input(s): PROBNP in the last 8760 hours. HbA1C: No results for input(s): HGBA1C in the last 72 hours. CBG: Recent Labs  Lab  03/06/19 0542 03/06/19 1115 03/06/19 1814 03/06/19 2335 03/07/19 0531  GLUCAP 131* 104* 129* 163* 120*   Lipid Profile: No results for input(s): CHOL, HDL, LDLCALC, TRIG, CHOLHDL, LDLDIRECT in the last 72 hours.  Thyroid Function Tests: No results for input(s): TSH, T4TOTAL, FREET4, T3FREE, THYROIDAB in the last 72 hours. Anemia Panel: No results for input(s): VITAMINB12, FOLATE, FERRITIN, TIBC, IRON, RETICCTPCT in the last 72 hours. Sepsis Labs: No results for input(s): PROCALCITON, LATICACIDVEN in the last 168 hours.  Recent Results (from the past 240 hour(s))  Aerobic/Anaerobic Culture (surgical/deep wound)     Status: None   Collection Time: 02/26/19  3:31 PM   Specimen: Abscess  Result Value Ref Range Status   Specimen Description   Final    ABSCESS LLQ CT DRAIN CATHETER Performed at [facility], [address] [place], [place] [address]    Special Requests   Final    Normal Performed at [facility], [place] [address], [place], [place] [address]    Gram Stain   Final    MODERATE WBC PRESENT,BOTH PMN AND MONONUCLEAR FEW YEAST    Culture   Final    FEW ENTEROCOCCUS FAECALIS FEW STENOTROPHOMONAS MALTOPHILIA MODERATE CANDIDA GLABRATA NO ANAEROBES ISOLATED Performed at [facility], [place] [address], [place], [place] [address]    Report Status 03/03/2019 FINAL  Final   Organism ID, Bacteria ENTEROCOCCUS FAECALIS  Final   Organism ID, Bacteria STENOTROPHOMONAS MALTOPHILIA  Final      Susceptibility   Enterococcus faecalis - MIC*    AMPICILLIN <=2 SENSITIVE Sensitive     VANCOMYCIN >=32 RESISTANT Resistant     GENTAMICIN SYNERGY SENSITIVE Sensitive     LINEZOLID 2 SENSITIVE Sensitive     * FEW ENTEROCOCCUS FAECALIS   Stenotrophomonas maltophilia - MIC*    LEVOFLOXACIN >=8 RESISTANT Resistant     TRIMETH/SULFA <=20 SENSITIVE Sensitive     * FEW STENOTROPHOMONAS MALTOPHILIA  Body fluid culture     Status: None  (Preliminary result)   Collection Time: 03/04/19 12:54 PM   Specimen: Lung, Right; Pleural Fluid  Result Value Ref Range Status   Specimen Description   Final    PLEURAL RIGHT Performed at [place] [address] [place], [place] [address]    Special Requests   Final    NONE Performed at [facility], [place] [address] [place], [place] [address]    Gram Stain   Final    RARE WBC PRESENT,BOTH PMN AND MONONUCLEAR NO ORGANISMS SEEN    Culture   Final    NO GROWTH 3 DAYS Performed at [facility], [place] [address], [place], [place] [address]    Report Status PENDING  Incomplete         Radiology Studies: No results found.      Scheduled Meds: . sodium chloride   Intravenous Once  . sodium chloride   Intravenous Once  . chlorhexidine gluconate (MEDLINE KIT)  15 mL Mouth Rinse BID  . Chlorhexidine Gluconate Cloth  6 each Topical Daily  . collagenase   Topical Daily  . Gerhardt's butt cream   Topical TID  . insulin aspart  0-20 Units Subcutaneous Q6H  . metoprolol tartrate  2.5 mg Intravenous Q6H  . pantoprazole (PROTONIX) IV  40 mg Intravenous Q12H  . sodium chloride flush  10-40 mL Intracatheter Q12H  . sodium chloride flush  5 mL Intracatheter Q8H   Continuous Infusions: . sodium chloride 10 mL/hr at 03/04/19 0558  . sodium chloride 10 mL/hr at 03/04/19 0202  . ampicillin-sulbactam (UNASYN) IV 3 g (03/07/19 1015)  . anidulafungin Stopped (03/06/19 1744)  . sulfamethoxazole-trimethoprim Stopped (03/07/19 0925)  .  TPN ADULT (ION) 90 mL/hr at 03/07/19 0400  . TPN ADULT (ION)       LOS: 33 days    Time spent: 35 mins.More than 50% of that time was spent in counseling and/or coordination of care.      Shelly Coss, MD [facility] Pager [phone]  If 7PM-7AM, please contact night-coverage [email or web address] Password [facility] 03/07/2019, 10:41 AM

## 2019-03-07 NOTE — Progress Notes (Signed)
PALLIATIVE NOTE:  Patient awake and alert. She is happy her son came to visit and brought her some gifts. She is wearing a [place] hat and son put some Christmas lights up in her room around the tv. She is proud when sharing them expressing family knows this is her most favorite holiday and time of year.   She denies pain or shortness of breath. Reports she is feeling better today continuing to express that the blood transfusion "really helped her". No family at the bedside. I called her daughter, [name] to follow up from extensive discussions on yesterday. Daughter continues to express her confusion with decisions. All questions answered and we again reviewed patient's current condition as requested by daughter.   Patient stated she did not want to talk about anything anymore today and asked to talk about Christmas. She states "it is my favorite time of year and I don't want to talk about nothing other than Christmas and be happy because this is what I wanted to live to see!" support given. Daughter verbalized understanding as well and her respect for Ms. Grinnell's request.   Patient wanted to sing a Christmas song. She was tearful and verbalized appreciation and support. She states she hopes to receive some Christmas cards tomorrow because she enjoys seeing families and everyone happy and together during this time of year.   Plan -Continue with current plan of care per medical team -If patient and daughter does not agree to comfort as recommended patient will require placement at [facility]. They are aware patient's prognosis remains poor despite transferring to [facility].  -PMT will continue to support and follow.   Total Time: 30 min.   Greater than 50%  of this time was spent counseling and coordinating care related to the above assessment and plan.  Alda Lea, AGPCNP-BC Palliative Medicine Team  Amion: N. Cousar

## 2019-03-07 NOTE — Progress Notes (Signed)
[facility]:  [phone] General Surgery Progress Note   LOS: 33 days  POD -  33 Days Post-Op  Assessment and Plan: 1.  EXPLORATORY LAPAROTOMY WITH OVERSEW OF DUODENAL ULCER with [name] patch - 02/03/2019 - Lucia Gaskins  persistent leak noted on CT scan with several fluid collections in the ventral abdomen and in pelvis.  RUQ drainage is a fair amount  Midline wound is not healing  On Unasyn/Eraxis/Bactrim  Prognosis continues to be poor - agree with consideration of comfort care - but the family wants to delay that discussion until after Christmas  She is more alert and oriented today for me than she usually is  2. Poorly controlled DM2- A1c15.2 3.  S/p bilateral BKA  Ischemic changes to both LE- defer to medicineandDr. Duda  4.  Acute onCKD- improved  Creatinine - 0.72 - 03/07/2019 5.  HTN 6.  Schizophrenia  7. Severe malnutrition - on TPN  Prealbumin12.7 on 03/03/2019 8.  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  Candida glabratabacteremia-per ID, on antifungal 10.  Unstageable sacral wound-cont santyl. 11.  Anemia  Hgb - 9.8 - 03/07/2019   Principal Problem:   Septic shock (HCC) Active Problems:   DKA (diabetic ketoacidoses) (HCC)   Severe protein-calorie malnutrition ([place])   Acute kidney injury ([place])   Pressure injury of skin   Perforated gastric ulcer ([place])   Acute respiratory failure (HCC)   Duodenal perforation (HCC)   Below-knee amputee ([place])   Atherosclerosis of native arteries of extremities with gangrene, right leg (HCC)   Atherosclerosis of native arteries of extremities with gangrene, left leg (HCC)   Intra-abdominal fluid collection  Subjective:  Wished me a Merry Christmas.  Alert and talkative - more oriented than most times I have seen her.  Objective:   Vitals:   03/07/19 0500 03/07/19 0700  BP: 126/67 117/64  Pulse: 82 81  Resp: 14 13  Temp:    SpO2:       Intake/Output  from previous day:  12/24 0701 - 12/25 0700 In: 3482.9 [I.V.:2057.1; IV Piggyback:1425.9] Out: 2450 [Urine:1400; Emesis/NG output:450; Drains:600]  Intake/Output this shift:  No intake/output data recorded.   Physical Exam:   General: AA F who arouses and respondes but is not oriented.    HEENT: Normal. Pupils equal. .   Lungs: Clear   Abdomen: Soft - RUQ/LUQ - 150/450 cc last 24 hours   Lab Results:    Recent Labs    03/06/19 1138 03/07/19 0218  WBC 10.6* PENDING  HGB 9.5* 9.8*  HCT 29.9* 31.5*  PLT 382 367    BMET   Recent Labs    03/06/19 1138 03/07/19 0218  NA 134* 133*  K 5.2* 4.6  CL 104 103  CO2 24 20*  GLUCOSE 108* 97  BUN 46* 42*  CREATININE 0.74 0.72  CALCIUM 8.2* 8.6*    PT/INR  No results for input(s): LABPROT, INR in the last 72 hours.  ABG  No results for input(s): PHART, HCO3 in the last 72 hours.  Invalid input(s): PCO2, PO2   Studies/Results:  No results found.   Anti-infectives:   Anti-infectives (From admission, onward)   Start     Dose/Rate Route Frequency Ordered Stop   03/03/19 1600  sulfamethoxazole-trimethoprim (BACTRIM) 200 mg of trimethoprim in dextrose 5 % 250 mL IVPB     200 mg of trimethoprim 262.5 mL/hr over 60 Minutes Intravenous Every 6 hours 03/03/19 1457 03/18/19 0159  03/01/19 1800  Ampicillin-Sulbactam (UNASYN) 3 g in sodium chloride 0.9 % 100 mL IVPB     3 g 200 mL/hr over 30 Minutes Intravenous Every 8 hours 03/01/19 1304 03/17/19 2359   02/28/19 1600  anidulafungin (ERAXIS) 100 mg in sodium chloride 0.9 % 100 mL IVPB     100 mg 78 mL/hr over 100 Minutes Intravenous Every 24 hours 02/27/19 1346 03/17/19 2359   02/27/19 1600  anidulafungin (ERAXIS) 200 mg in sodium chloride 0.9 % 200 mL IVPB     200 mg 78 mL/hr over 200 Minutes Intravenous  Once 02/27/19 1346 02/27/19 1840   02/18/19 1800  piperacillin-tazobactam (ZOSYN) IVPB 3.375 g  Status:  Discontinued     3.375 g 12.5 mL/hr over 240 Minutes Intravenous Every  8 hours 02/18/19 1652 03/01/19 1259   02/05/19 1000  anidulafungin (ERAXIS) 100 mg in sodium chloride 0.9 % 100 mL IVPB  Status:  Discontinued     100 mg 78 mL/hr over 100 Minutes Intravenous Every 24 hours 02/04/19 0840 02/25/19 1604   02/04/19 1000  anidulafungin (ERAXIS) 200 mg in sodium chloride 0.9 % 200 mL IVPB     200 mg 78 mL/hr over 200 Minutes Intravenous  Once 02/04/19 0840 02/04/19 1301   02/04/19 0200  fluconazole (DIFLUCAN) IVPB 200 mg  Status:  Discontinued     200 mg 100 mL/hr over 60 Minutes Intravenous Every 24 hours 02/02/19 2302 02/04/19 0840   02/03/19 1800  vancomycin (VANCOCIN) IVPB 750 mg/150 ml premix  Status:  Discontinued     750 mg 150 mL/hr over 60 Minutes Intravenous Every 24 hours 02/03/19 0241 02/03/19 0242   02/03/19 1800  vancomycin (VANCOCIN) IVPB 750 mg/150 ml premix  Status:  Discontinued     750 mg 150 mL/hr over 60 Minutes Intravenous Every 24 hours 02/03/19 0242 02/03/19 0858   02/03/19 0600  piperacillin-tazobactam (ZOSYN) IVPB 3.375 g     3.375 g 12.5 mL/hr over 240 Minutes Intravenous Every 8 hours 02/03/19 0243 02/17/19 0205   02/02/19 2315  fluconazole (DIFLUCAN) IVPB 400 mg     400 mg 100 mL/hr over 120 Minutes Intravenous STAT 02/02/19 2301 02/03/19 0218   02/02/19 1800  ceFEPIme (MAXIPIME) 2 g in sodium chloride 0.9 % 100 mL IVPB     2 g 200 mL/hr over 30 Minutes Intravenous  Once 02/02/19 1745 02/02/19 2030   02/02/19 1800  metroNIDAZOLE (FLAGYL) IVPB 500 mg     500 mg 100 mL/hr over 60 Minutes Intravenous  Once 02/02/19 1745 02/02/19 2237   02/02/19 1800  vancomycin (VANCOCIN) IVPB 1000 mg/200 mL premix  Status:  Discontinued     1,000 mg 200 mL/hr over 60 Minutes Intravenous  Once 02/02/19 1745 02/02/19 1747   02/02/19 1800  vancomycin (VANCOCIN) 1,250 mg in sodium chloride 0.9 % 250 mL IVPB     1,250 mg 166.7 mL/hr over 90 Minutes Intravenous  Once 02/02/19 1747 02/02/19 2237      Alphonsa Overall, MD, [facility]  Office: [phone] 03/07/2019

## 2019-03-08 LAB — BASIC METABOLIC PANEL
Anion gap: 11 (ref 5–15)
BUN: 49 mg/dL — ABNORMAL HIGH (ref 6–20)
CO2: 19 mmol/L — ABNORMAL LOW (ref 22–32)
Calcium: 8.7 mg/dL — ABNORMAL LOW (ref 8.9–10.3)
Chloride: 102 mmol/L (ref 98–111)
Creatinine, Ser: 0.73 mg/dL (ref 0.44–1.00)
GFR calc Af Amer: >60 mL/min (ref >60)
GFR calc non Af Amer: >60 mL/min (ref >60)
Glucose, Bld: 76 mg/dL (ref 70–99)
Potassium: 4.1 mmol/L (ref 3.5–5.1)
Sodium: 132 mmol/L — ABNORMAL LOW (ref 135–145)

## 2019-03-08 LAB — GLUCOSE, CAPILLARY
Glucose-Capillary: 124 mg/dL — ABNORMAL HIGH (ref 70–99)
Glucose-Capillary: 144 mg/dL — ABNORMAL HIGH (ref 70–99)
Glucose-Capillary: 89 mg/dL (ref 70–99)
Glucose-Capillary: 95 mg/dL (ref 70–99)

## 2019-03-08 MED ORDER — TRAVASOL 10 % IV SOLN
INTRAVENOUS | Status: AC
  Filled 2019-03-08: qty 1296

## 2019-03-08 NOTE — Progress Notes (Addendum)
PROGRESS NOTE    Gina [name]  RFX:[id number] DOB: [birth year] DOA: 02/02/2019 PCP: [name], MD   Brief Narrative:  Patient is a 58 year old female with history of severe protein calorie malnutrition, poorly controlled type diabetes mellitus with bilateral foot ulcers, bilateral BKA, recent left phalanx amputation, schizophrenia, hypertension who was admitted to the ICU with septic shock.  She was found to have perforated gastric ulcer with pneumoperitoneum, also had evidence DKA.  She was intubated and admitted to ICU.  Started on broad-spectrum antibiotics, pressors, IV fluids.  She underwent expiratory laparotomy 11/23 with finding of perforated prepyloric ulcer which was repaired, contaminated peritoneal fluid was drained.  She was extubated on 11/29.  She had extensive complicated hospital course with DKA, AKI, acute blood loss anemia, thrombocytopenia, intra-abdominal infection, bacteremia, fungemia.    General surgery closely following.  Currently on IV antibiotics.  Abdominal CT done on 12/15 showed persistent abdominal collection,leak.  IR placed left lower quadrant anterior peritoneal drain catheter on 02/26/19.Restarted on eraxis after aerobic/anaerobic culture showed yeast. On 03/03/19, she had multiple bloody bowel movements including bloody output from the JP drain.  GI consulted.  CT angiogram of the abdomen did not show any active bleeding but showed persistent large complex anterior peritoneal fluid collection.  12/24: Hemodynamically stable.  Looks  better today, comfortable.  Mental status has significantly improved today and she is alert and awake, communicative and oriented.  She denies any abdominal pain or any new problems.  She wished  me Angela Nevin Christmas  We still recommend comfort care but family is reluctant. If family decides against comfort care, then we will plan to discharge her to [facility]  if she continues to remain hemodynamically stable.General surgery is  agreeable with this plan is transfer her to [facility]. Discussed with case manager today, discharged on weekend is not possible. So tentative plan is to discharge her to [facility] on Monday pending family's decision for comfort care.   Assessment & Plan:   Principal Problem:   Septic shock ([place]) Active Problems:   DKA (diabetic ketoacidoses) ([place])   Severe protein-calorie malnutrition ([place])   Acute kidney injury ([place])   Pressure injury of skin   Perforated gastric ulcer ([place])   Acute respiratory failure ([place])   Duodenal perforation (HCC)   Below-knee amputee ([place])   Atherosclerosis of native arteries of extremities with gangrene, right leg (HCC)   Atherosclerosis of native arteries of extremities with gangrene, left leg (HCC)   Intra-abdominal fluid collection   Septic shock due to ruptured gastric ulcer with acute peritonitis, fungemia, UTI: S/pex lap with oversew of duodenal ulcer,with Graham patch 11/23,DrLucia Gaskins, for perforated prepyloric ulcer with gross contamination.General surgery following. ID was consulted and following. Blood cultures had shown Candida glabrata and she completed the course for fungemia.  Urine culture showed Klebsiella pneumoniae.  Peritoneal fluid culture showed Candida albicans. No evidence of fungal endophthalmitis after ophthalmology consultation.. CT abdomen done on 12/15 and 12/21 showed persistent large  fluid collection on the anterior abdomen,possible leak.IR placed left lower quadrant anterior peritoneal drain catheter on 12/16.[name] Aerobic/anaerobic culture now again showed candida glabrata Enterococcus faecalis, stenotrophomonas.  Restarted on Eraxis.Also on unasyn and bactrim .ID recommends to continue these antibiotics till January 4 and then monitor off antibiotics She has a RLQ JP drain with suction and midline surgical wound.Has NG tube  Concern for GI bleed: On 12/21 she had multiple bloody bowel movements and there was output of bloody content  in the JP drain.  GI  consulted.  CT angio did not show any active bleeding.Hemoglobin dropped to 6 so she was transfused with 2 units of PRBC on 03/05/19.Hb in the range of 9 now.  Moderate right pleural effusion: Atelectasis of the right lower lobe ith  decreased air entry on the right side.  Underwent thoracentesis on 12/22 with removal of 700 mL of fluid.  On 2-3L of oxygen per minute.  Ischemic changes at bilateral BKA site: Possibly secondary to severe septic shock.  She was seen by vascular surgery, orthopedics.  She will eventually need above-knee amputation when she is more stable.  Patient declined surgery at this time so continue current medical treatment  Acute kidney injury: Back to baseline now  ICU delirium: Secondary to infectious etiology, hospital admission, ICU environment.  Mental status has improved and she is currently alert and oriented.  DKA with severely uncontrolled hyperglycemia: Resolved.  Hemoglobin A1c of 15.2 as per 11/23.  Continue current insulin regimen  Severe protein calorie malnutrition with hypoalbuminemia: On TPN.   Dietitian following  Anemia due to acute blood loss, critical illness:  Total of 5 units so far this hospitalization.  Continue to monitor CBC  Acute thrombocytopenia: Secondary to septic shock.  Resolved  Status post bilateral BKA, left phalanx amputation: Hand surgery and orthopedics were following.  Hep C: Positive Ab. HCV RNA of 13,700,000   Debility/deconditioning/poor prognosis: Extremely sick patient.  Complex hospital course.  Palliative  care was consulted for goals of care discussion.  DNR now.  We recommend comfort care/hospice but family is reluctant.     Pressure Injury 12/26/18 Buttocks Left Stage II -  Partial thickness loss of dermis presenting as a shallow open ulcer with a red, pink wound bed without slough. (Active)  12/26/18 0630  Location: Buttocks  Location Orientation: Left  Staging: Stage II -  Partial thickness  loss of dermis presenting as a shallow open ulcer with a red, pink wound bed without slough.  Wound Description (Comments):   Present on Admission: Yes     Pressure Injury 12/26/18 Buttocks Right;Lower Stage II -  Partial thickness loss of dermis presenting as a shallow open ulcer with a red, pink wound bed without slough. (Active)  12/26/18 0630  Location: Buttocks  Location Orientation: Right;Lower  Staging: Stage II -  Partial thickness loss of dermis presenting as a shallow open ulcer with a red, pink wound bed without slough.  Wound Description (Comments):   Present on Admission: Yes     Pressure Injury 02/03/19 Coccyx Right;Left;Medial Unstageable - Full thickness tissue loss in which the base of the ulcer is covered by slough (yellow, tan, gray, green or brown) and/or eschar (tan, brown or black) in the wound bed. pt has 3 small injuri (Active)  02/03/19 0230  Location: Coccyx  Location Orientation: Right;Left;Medial  Staging: Unstageable - Full thickness tissue loss in which the base of the ulcer is covered by slough (yellow, tan, gray, green or brown) and/or eschar (tan, brown or black) in the wound bed.  Wound Description (Comments): pt has 3 small injuries all in the same area, one is medial and two are on either side of it  Present on Admission: Yes           Nutrition Problem: Inadequate oral intake Etiology: altered GI function(pyloric channel ulcer perforation s/p ex lap)      DVT prophylaxis:SCD Code Status: Full Family Communication: Discussed with daughter on phone several times in last few days Disposition Plan: She has a  bed in [facility]. We recommend hospice/comfort care .  Ongoing discussion but family is reluctant .  If family decides against hospice/comfort care, will plan to discharge her to [place] on Monday  Consultants: General surgery, PCCM, ID  Procedures: Exploratory laparotomy  Antimicrobials:  Anti-infectives (From admission, onward)   Start      Dose/Rate Route Frequency Ordered Stop   03/03/19 1600  sulfamethoxazole-trimethoprim (BACTRIM) 200 mg of trimethoprim in dextrose 5 % 250 mL IVPB     200 mg of trimethoprim 262.5 mL/hr over 60 Minutes Intravenous Every 6 hours 03/03/19 1457 03/18/19 0159   03/01/19 1800  Ampicillin-Sulbactam (UNASYN) 3 g in sodium chloride 0.9 % 100 mL IVPB     3 g 200 mL/hr over 30 Minutes Intravenous Every 8 hours 03/01/19 1304 03/17/19 2359   02/28/19 1600  anidulafungin (ERAXIS) 100 mg in sodium chloride 0.9 % 100 mL IVPB     100 mg 78 mL/hr over 100 Minutes Intravenous Every 24 hours 02/27/19 1346 03/17/19 2359   02/27/19 1600  anidulafungin (ERAXIS) 200 mg in sodium chloride 0.9 % 200 mL IVPB     200 mg 78 mL/hr over 200 Minutes Intravenous  Once 02/27/19 1346 02/27/19 1840   02/18/19 1800  piperacillin-tazobactam (ZOSYN) IVPB 3.375 g  Status:  Discontinued     3.375 g 12.5 mL/hr over 240 Minutes Intravenous Every 8 hours 02/18/19 1652 03/01/19 1259   02/05/19 1000  anidulafungin (ERAXIS) 100 mg in sodium chloride 0.9 % 100 mL IVPB  Status:  Discontinued     100 mg 78 mL/hr over 100 Minutes Intravenous Every 24 hours 02/04/19 0840 02/25/19 1604   02/04/19 1000  anidulafungin (ERAXIS) 200 mg in sodium chloride 0.9 % 200 mL IVPB     200 mg 78 mL/hr over 200 Minutes Intravenous  Once 02/04/19 0840 02/04/19 1301   02/04/19 0200  fluconazole (DIFLUCAN) IVPB 200 mg  Status:  Discontinued     200 mg 100 mL/hr over 60 Minutes Intravenous Every 24 hours 02/02/19 2302 02/04/19 0840   02/03/19 1800  vancomycin (VANCOCIN) IVPB 750 mg/150 ml premix  Status:  Discontinued     750 mg 150 mL/hr over 60 Minutes Intravenous Every 24 hours 02/03/19 0241 02/03/19 0242   02/03/19 1800  vancomycin (VANCOCIN) IVPB 750 mg/150 ml premix  Status:  Discontinued     750 mg 150 mL/hr over 60 Minutes Intravenous Every 24 hours 02/03/19 0242 02/03/19 0858   02/03/19 0600  piperacillin-tazobactam (ZOSYN) IVPB 3.375 g      3.375 g 12.5 mL/hr over 240 Minutes Intravenous Every 8 hours 02/03/19 0243 02/17/19 0205   02/02/19 2315  fluconazole (DIFLUCAN) IVPB 400 mg     400 mg 100 mL/hr over 120 Minutes Intravenous STAT 02/02/19 2301 02/03/19 0218   02/02/19 1800  ceFEPIme (MAXIPIME) 2 g in sodium chloride 0.9 % 100 mL IVPB     2 g 200 mL/hr over 30 Minutes Intravenous  Once 02/02/19 1745 02/02/19 2030   02/02/19 1800  metroNIDAZOLE (FLAGYL) IVPB 500 mg     500 mg 100 mL/hr over 60 Minutes Intravenous  Once 02/02/19 1745 02/02/19 2237   02/02/19 1800  vancomycin (VANCOCIN) IVPB 1000 mg/200 mL premix  Status:  Discontinued     1,000 mg 200 mL/hr over 60 Minutes Intravenous  Once 02/02/19 1745 02/02/19 1747   02/02/19 1800  vancomycin (VANCOCIN) 1,250 mg in sodium chloride 0.9 % 250 mL IVPB     1,250 mg 166.7 mL/hr over 90  Minutes Intravenous  Once 02/02/19 1747 02/02/19 2237       Objective: Vitals:   03/08/19 1000 03/08/19 1100 03/08/19 1200 03/08/19 1300  BP: 123/72 138/68 (!) 127/46 118/63  Pulse: 88 86 83 76  Resp: 17 20 13 14   Temp:   97.6 F (36.4 C)   TempSrc:   Oral   SpO2: 100% 100% 100% 100%  Weight:      Height:        Intake/Output Summary (Last 24 hours) at 03/08/2019 1403 Last data filed at 03/08/2019 1300 Gross per 24 hour  Intake 5068.85 ml  Output 2425 ml  Net 2643.85 ml   Filed Weights   03/01/19 0500 03/02/19 0453 03/03/19 0500  Weight: 51.7 kg 51.7 kg 53.3 kg    Examination:  General exam: Extremely Deconditioned, debilitated, weak HEENT:PERRL, Ear/Nose normal on gross exam,NG tube Respiratory system: Decreased air entry on the right side Cardiovascular system: S1 & S2 heard, RRR. No JVD, murmurs, rubs, gallops or clicks.  Gastrointestinal system: Abdomen is mildly distended, soft ,mild generalized tenderness.  Covered with dressings .bowel sounds very slow. Midline abdominal surgical wound, right lower quadrant and left lower quadrant drains with output of dark  fluid Central nervous system: lethargic  extremities: Bilateral BKA.  Amputation site covered with dressing, amputation of left middle finger Skin: No rashes, lesions no icterus ,no pallor.  Unstageable sacral wound   Data Reviewed: I have personally reviewed following labs and imaging studies  CBC: Recent Labs  Lab 03/03/19 0201 03/05/19 0207 03/05/19 1713 03/06/19 1019 03/06/19 1138 03/07/19 0218  WBC 9.2 13.2* 9.9 SPECIMEN CONTAMINATED, UNABLE TO PERFORM TEST(S). 10.6* 9.8  NEUTROABS 5.9 9.9*  --  SPECIMEN CONTAMINATED, UNABLE TO PERFORM TEST(S). 6.8 6.0  HGB 8.8* 6.2* 9.0* SPECIMEN CONTAMINATED, UNABLE TO PERFORM TEST(S). 9.5* 9.8*  HCT 29.5* 20.8* 27.6* SPECIMEN CONTAMINATED, UNABLE TO PERFORM TEST(S). 29.9* 31.5*  MCV 95.8 96.3 94.5 SPECIMEN CONTAMINATED, UNABLE TO PERFORM TEST(S). 92.0 93.8  PLT 655* 450* 380 SPECIMEN CONTAMINATED, UNABLE TO PERFORM TEST(S). 382 161   Basic Metabolic Panel: Recent Labs  Lab 03/03/19 0201 03/05/19 0207 03/06/19 1019 03/06/19 1138 03/07/19 0218 03/08/19 0212  NA 143 139  --  134* 133* 132*  K 4.4 4.9  --  5.2* 4.6 4.1  CL 111 110  --  104 103 102  CO2 25 21*  --  24 20* 19*  GLUCOSE 133* 111*  --  108* 97 76  BUN 44* 60*  --  46* 42* 49*  CREATININE 0.54 0.85  --  0.74 0.72 0.73  CALCIUM 9.2 8.5*  --  8.2* 8.6* 8.7*  MG 2.2  --  SPECIMEN CONTAMINATED, UNABLE TO PERFORM TEST(S). 2.2  --   --   PHOS 3.5  --  SPECIMEN CONTAMINATED, UNABLE TO PERFORM TEST(S). 3.6  --   --    GFR: Estimated Creatinine Clearance: 61.4 mL/min (by C-G formula based on SCr of 0.73 mg/dL). Liver Function Tests: Recent Labs  Lab 03/03/19 0201 03/06/19 1138  AST 35 38  ALT 33 38  ALKPHOS 369* 293*  BILITOT 0.6 0.3  PROT 6.9 6.6  ALBUMIN 1.5* 1.4*   No results for input(s): LIPASE, AMYLASE in the last 168 hours. No results for input(s): AMMONIA in the last 168 hours. Coagulation Profile: No results for input(s): INR, PROTIME in the last 168  hours. Cardiac Enzymes: No results for input(s): CKTOTAL, CKMB, CKMBINDEX, TROPONINI in the last 168 hours. BNP (last 3 results) No results  for input(s): PROBNP in the last 8760 hours. HbA1C: No results for input(s): HGBA1C in the last 72 hours. CBG: Recent Labs  Lab 03/07/19 0531 03/07/19 1151 03/07/19 1737 03/07/19 2323 03/08/19 0543  GLUCAP 120* 194* 136* 159* 95   Lipid Profile: No results for input(s): CHOL, HDL, LDLCALC, TRIG, CHOLHDL, LDLDIRECT in the last 72 hours. Thyroid Function Tests: No results for input(s): TSH, T4TOTAL, FREET4, T3FREE, THYROIDAB in the last 72 hours. Anemia Panel: No results for input(s): VITAMINB12, FOLATE, FERRITIN, TIBC, IRON, RETICCTPCT in the last 72 hours. Sepsis Labs: No results for input(s): PROCALCITON, LATICACIDVEN in the last 168 hours.  Recent Results (from the past 240 hour(s))  Aerobic/Anaerobic Culture (surgical/deep wound)     Status: None   Collection Time: 02/26/19  3:31 PM   Specimen: Abscess  Result Value Ref Range Status   Specimen Description   Final    ABSCESS LLQ CT DRAIN CATHETER Performed at [facility], [address], [place], [place] [address]    Special Requests   Final    Normal Performed at [facility], [place] [address], [place], [place] [address]    Gram Stain   Final    MODERATE WBC PRESENT,BOTH PMN AND MONONUCLEAR FEW YEAST    Culture   Final    FEW ENTEROCOCCUS FAECALIS FEW STENOTROPHOMONAS MALTOPHILIA MODERATE CANDIDA GLABRATA NO ANAEROBES ISOLATED Performed at [facility], [place] [address], [place], [place] [address]    Report Status 03/03/2019 FINAL  Final   Organism ID, Bacteria ENTEROCOCCUS FAECALIS  Final   Organism ID, Bacteria STENOTROPHOMONAS MALTOPHILIA  Final      Susceptibility   Enterococcus faecalis - MIC*    AMPICILLIN <=2 SENSITIVE Sensitive     VANCOMYCIN >=32 RESISTANT Resistant     GENTAMICIN SYNERGY SENSITIVE Sensitive      LINEZOLID 2 SENSITIVE Sensitive     * FEW ENTEROCOCCUS FAECALIS   Stenotrophomonas maltophilia - MIC*    LEVOFLOXACIN >=8 RESISTANT Resistant     TRIMETH/SULFA <=20 SENSITIVE Sensitive     * FEW STENOTROPHOMONAS MALTOPHILIA  Body fluid culture     Status: None   Collection Time: 03/04/19 12:54 PM   Specimen: Lung, Right; Pleural Fluid  Result Value Ref Range Status   Specimen Description   Final    PLEURAL RIGHT Performed at [place] [address] [place], [place] [address]    Special Requests   Final    NONE Performed at [facility], [place] [address], [place], [place] [address]    Gram Stain   Final    RARE WBC PRESENT,BOTH PMN AND MONONUCLEAR NO ORGANISMS SEEN    Culture   Final    NO GROWTH 3 DAYS Performed at [facility], [place] [address] [place],  [address]    Report Status 03/07/2019 FINAL  Final         Radiology Studies: No results found.      Scheduled Meds: . sodium chloride   Intravenous Once  . sodium chloride   Intravenous Once  . chlorhexidine gluconate (MEDLINE KIT)  15 mL Mouth Rinse BID  . Chlorhexidine Gluconate Cloth  6 each Topical Daily  . collagenase   Topical Daily  . Gerhardt's butt cream   Topical TID  . insulin aspart  0-20 Units Subcutaneous Q6H  . metoprolol tartrate  2.5 mg Intravenous Q6H  . pantoprazole (PROTONIX) IV  40 mg Intravenous Q12H  . sodium  chloride flush  10-40 mL Intracatheter Q12H  . sodium chloride flush  5 mL Intracatheter Q8H   Continuous Infusions: . sodium chloride 10 mL/hr at 03/04/19 0558  . sodium chloride 10 mL/hr at 03/04/19 0202  . ampicillin-sulbactam (UNASYN) IV Stopped (03/08/19 1116)  . anidulafungin Stopped (03/07/19 1734)  . sulfamethoxazole-trimethoprim Stopped (03/08/19 0953)  . TPN ADULT (ION) 90 mL/hr at 03/08/19 1300  . TPN ADULT (ION)       LOS: 34 days    Time spent: 35 mins.More than 50% of that time was spent in counseling  and/or coordination of care.      Shelly Coss, MD [facility] Pager [phone]  If 7PM-7AM, please contact night-coverage [email or web address] Password [email or web address] 03/08/2019, 2:03 PM

## 2019-03-08 NOTE — Progress Notes (Signed)
PHARMACY - TOTAL PARENTERAL NUTRITION CONSULT NOTE   Indication: Small bowel obstruction  Patient Measurements: Height: 5' 2"  (157.5 cm) Weight: 117 lb 8.1 oz (53.3 kg) IBW/kg (Calculated) : 50.1 TPN AdjBW (KG): 57.9 Body mass index is 21.49 kg/m. Usual Weight: 55-60 kg Ideal Body Weight:  44.2 kg(Adjusted IBW for bilateral BKA)  Assessment:  25 yoF with poorly controlled DM s/p BL BKA, admitted 11/23 with AMS, n/v, abdominal pain. CT abdomen showed pneumoperitoneum and mesenteric ischemia. Underwent ex lap on 11/23 with finding of perforated prepyloric ulcer which was oversewn, along with gross contamination of the peritoneum. Hospital course complicated by DKA, AKI, acute thrombocytopenia, and C. glabrata candidemia. Patient remains with high NGT d/t bowel edema as demonstrated on UGI. TPN started on 11/30 but was stopped shortly thereafter to facilitate central line holiday for candidemia. Repeat blood cultures remain negative to date, and TPN now restarting with replacement of central line. Patient at risk for refeeding given prolonged lack of nutrition.  Glucose / Insulin: Hx DM; prev required D10 W infusion to maintain euglycemia  Current resistant Novolog scale q6h (12/5)   CBGs ( goal <150)  range 76-136, 11 units of Novolog  past 24 hrs, 30 units of regular insulin added to TPN  Electrolytes: WNL except Na now low at 132 Renal: AKI on admit, SCr now WNL  LFTs / TGs: WNL except Alk phos up to 369, Trig 153 (12/21) Prealbumin / albumin: both low (11/30), 5.2 (12/4). 5.3 ( 12/7)  7.7 (12/14), 12.7 (12/21) MIVF: NS at 10 ml/hr GI Imaging: 11/30 UGI: high NG output & bowel edema 12/15 CTpersistent leak noted on CT scan with several fluid collections in the ventral abdomen and in pelvis.  Surgeries / Procedures: 11/22 ex lap: oversew of duodenal ulcer with [name] patch 12/16 IR placed drain into ant abd collection  Central access: PICC replaced 12/3  TPN start date: resumed 12/3  (~ 2200)  Nutritional Goals (per RD recommendation on 12/11): Kcal:  2025-2330 kcal Protein:  115-130 grams Fluid:  >/= 2 L/day Goal TPN rate is 90 mL/hr (provides 129 g of protein and 2047 kcals per day) - GIR 3.3 mg/kg/min Current Nutrition:  NPO  Plan: at 1800  Continue custom TPN at 90 mL/hr (goal rate)   Electrolytes in TPN:  Increase 63mq/L Na, no K, no Ca, Magnesium 10 mEq/L , and 10 mmol/L of Phos; change Cl:Ac ratio 1:1  Standard MVI TPN on MWF only d/t nProducer, television/film/video Daily trace elements  Continue resistant SSI with q6h CBG checks and adjust as needed   Continue Novolog 30 units in TPN  [facility]   Monitor TPN labs on Mon/Thurs.  Check Bmet in am.  EDolly RiasRPh 03/08/2019, 8:26 AM

## 2019-03-08 NOTE — Progress Notes (Addendum)
[place] [facility] Office:  [phone] General Surgery Progress Note   LOS: 34 days  POD -  34 Days Post-Op  Assessment and Plan: 1.  EXPLORATORY LAPAROTOMY WITH OVERSEW OF DUODENAL ULCER with [name] patch - 02/03/2019 - Lucia Gaskins  persistent leak noted on CT scan with several fluid collections in the ventral abdomen and in pelvis.  RUQ drainage is a fair amount  Midline wound is not healing  On Unasyn/Eraxis/Bactrim  Prognosis continues to be poor - agree with consideration of comfort care - but the family wants to delay that discussion until after Christmas  She mentally looks good today. She has no complaints.  2. Poorly controlled DM2- A1c15.2 3.  S/p bilateral BKA  Ischemic changes to both LE- defer to medicineandDr. Duda  4.  Acute onCKD- improved  Creatinine - 0.73 - 03/08/2019 5.  HTN 6.  Schizophrenia  7. Severe malnutrition - on TPN  Prealbumin12.7 on 03/03/2019 8.  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  Candida glabratabacteremia-per ID, on antifungal 10.  Unstageable sacral wound-cont santyl. 11.  Anemia  Hgb - 9.8 - 03/07/2019   Principal Problem:   Septic shock (HCC) Active Problems:   DKA (diabetic ketoacidoses) (HCC)   Severe protein-calorie malnutrition ([place])   Acute kidney injury ([place])   Pressure injury of skin   Perforated gastric ulcer ([employer])   Acute respiratory failure (HCC)   Duodenal perforation (HCC)   Below-knee amputee ([place])   Atherosclerosis of native arteries of extremities with gangrene, right leg (HCC)   Atherosclerosis of native arteries of extremities with gangrene, left leg (HCC)   Intra-abdominal fluid collection  Subjective:  She is doing well and has no complaints.  She remains alert and oriented.  She has Thumbelina, her sock puppet.  Objective:   Vitals:   03/08/19 0600 03/08/19 0654  BP: (!) 134/96   Pulse: 91   Resp: (!) 21   Temp:  97.7 F (36.5 C)  SpO2: 100%       Intake/Output from previous day:  12/25 0701 - 12/26 0700 In: 4087.1 [I.V.:2427.4; IV Piggyback:1659.7] Out: [id number] [Urine:1450; Emesis/NG output:420; Drains:555]  Intake/Output this shift:  No intake/output data recorded.   Physical Exam:   General: AA F who arouses and respondes but is not oriented.    HEENT: Normal. Pupils equal. .   Lungs: Clear   Abdomen: Soft - RUQ/LUQ - 150/450 cc last 24 hours   Lab Results:    Recent Labs    03/06/19 1138 03/07/19 0218  WBC 10.6* 9.8  HGB 9.5* 9.8*  HCT 29.9* 31.5*  PLT 382 367    BMET   Recent Labs    03/07/19 0218 03/08/19 0212  NA 133* 132*  K 4.6 4.1  CL 103 102  CO2 20* 19*  GLUCOSE 97 76  BUN 42* 49*  CREATININE 0.72 0.73  CALCIUM 8.6* 8.7*    PT/INR  No results for input(s): LABPROT, INR in the last 72 hours.  ABG  No results for input(s): PHART, HCO3 in the last 72 hours.  Invalid input(s): PCO2, PO2   Studies/Results:  No results found.   Anti-infectives:   Anti-infectives (From admission, onward)   Start     Dose/Rate Route Frequency Ordered Stop   03/03/19 1600  sulfamethoxazole-trimethoprim (BACTRIM) 200 mg of trimethoprim in dextrose 5 % 250 mL IVPB     200 mg of trimethoprim 262.5 mL/hr over 60 Minutes Intravenous Every 6 hours 03/03/19 1457 03/18/19  0159   03/01/19 1800  Ampicillin-Sulbactam (UNASYN) 3 g in sodium chloride 0.9 % 100 mL IVPB     3 g 200 mL/hr over 30 Minutes Intravenous Every 8 hours 03/01/19 1304 03/17/19 2359   02/28/19 1600  anidulafungin (ERAXIS) 100 mg in sodium chloride 0.9 % 100 mL IVPB     100 mg 78 mL/hr over 100 Minutes Intravenous Every 24 hours 02/27/19 1346 03/17/19 2359   02/27/19 1600  anidulafungin (ERAXIS) 200 mg in sodium chloride 0.9 % 200 mL IVPB     200 mg 78 mL/hr over 200 Minutes Intravenous  Once 02/27/19 1346 02/27/19 1840   02/18/19 1800  piperacillin-tazobactam (ZOSYN) IVPB 3.375 g  Status:  Discontinued     3.375 g 12.5 mL/hr over 240 Minutes  Intravenous Every 8 hours 02/18/19 1652 03/01/19 1259   02/05/19 1000  anidulafungin (ERAXIS) 100 mg in sodium chloride 0.9 % 100 mL IVPB  Status:  Discontinued     100 mg 78 mL/hr over 100 Minutes Intravenous Every 24 hours 02/04/19 0840 02/25/19 1604   02/04/19 1000  anidulafungin (ERAXIS) 200 mg in sodium chloride 0.9 % 200 mL IVPB     200 mg 78 mL/hr over 200 Minutes Intravenous  Once 02/04/19 0840 02/04/19 1301   02/04/19 0200  fluconazole (DIFLUCAN) IVPB 200 mg  Status:  Discontinued     200 mg 100 mL/hr over 60 Minutes Intravenous Every 24 hours 02/02/19 2302 02/04/19 0840   02/03/19 1800  vancomycin (VANCOCIN) IVPB 750 mg/150 ml premix  Status:  Discontinued     750 mg 150 mL/hr over 60 Minutes Intravenous Every 24 hours 02/03/19 0241 02/03/19 0242   02/03/19 1800  vancomycin (VANCOCIN) IVPB 750 mg/150 ml premix  Status:  Discontinued     750 mg 150 mL/hr over 60 Minutes Intravenous Every 24 hours 02/03/19 0242 02/03/19 0858   02/03/19 0600  piperacillin-tazobactam (ZOSYN) IVPB 3.375 g     3.375 g 12.5 mL/hr over 240 Minutes Intravenous Every 8 hours 02/03/19 0243 02/17/19 0205   02/02/19 2315  fluconazole (DIFLUCAN) IVPB 400 mg     400 mg 100 mL/hr over 120 Minutes Intravenous STAT 02/02/19 2301 02/03/19 0218   02/02/19 1800  ceFEPIme (MAXIPIME) 2 g in sodium chloride 0.9 % 100 mL IVPB     2 g 200 mL/hr over 30 Minutes Intravenous  Once 02/02/19 1745 02/02/19 2030   02/02/19 1800  metroNIDAZOLE (FLAGYL) IVPB 500 mg     500 mg 100 mL/hr over 60 Minutes Intravenous  Once 02/02/19 1745 02/02/19 2237   02/02/19 1800  vancomycin (VANCOCIN) IVPB 1000 mg/200 mL premix  Status:  Discontinued     1,000 mg 200 mL/hr over 60 Minutes Intravenous  Once 02/02/19 1745 02/02/19 1747   02/02/19 1800  vancomycin (VANCOCIN) 1,250 mg in sodium chloride 0.9 % 250 mL IVPB     1,250 mg 166.7 mL/hr over 90 Minutes Intravenous  Once 02/02/19 1747 02/02/19 2237      Alphonsa Overall, MD,  [facility] Surgery Office: [phone] 03/08/2019

## 2019-03-09 DIAGNOSIS — R0603 Acute respiratory distress: Secondary | ICD-10-CM

## 2019-03-09 LAB — BPAM RBC
Blood Product Expiration Date: 202101242359
Blood Product Expiration Date: 202101242359
Blood Product Expiration Date: 202101262359
ISSUE DATE / TIME: 202012230547
ISSUE DATE / TIME: 202012231040
Unit Type and Rh: 5100
Unit Type and Rh: 5100
Unit Type and Rh: 5100

## 2019-03-09 LAB — BASIC METABOLIC PANEL
Anion gap: 7 (ref 5–15)
BUN: 46 mg/dL — ABNORMAL HIGH (ref 6–20)
CO2: 18 mmol/L — ABNORMAL LOW (ref 22–32)
Calcium: 8.3 mg/dL — ABNORMAL LOW (ref 8.9–10.3)
Chloride: 105 mmol/L (ref 98–111)
Creatinine, Ser: 0.69 mg/dL (ref 0.44–1.00)
GFR calc Af Amer: >60 mL/min (ref >60)
GFR calc non Af Amer: >60 mL/min (ref >60)
Glucose, Bld: 134 mg/dL — ABNORMAL HIGH (ref 70–99)
Potassium: 3.7 mmol/L (ref 3.5–5.1)
Sodium: 130 mmol/L — ABNORMAL LOW (ref 135–145)

## 2019-03-09 LAB — GLUCOSE, CAPILLARY
Glucose-Capillary: 115 mg/dL — ABNORMAL HIGH (ref 70–99)
Glucose-Capillary: 171 mg/dL — ABNORMAL HIGH (ref 70–99)
Glucose-Capillary: 99 mg/dL (ref 70–99)
Glucose-Capillary: 157 mg/dL — ABNORMAL HIGH (ref 70–99)

## 2019-03-09 LAB — TYPE AND SCREEN
ABO/RH(D): O POS
Antibody Screen: NEGATIVE
Unit division: 0
Unit division: 0
Unit division: 0

## 2019-03-09 MED ORDER — TRAVASOL 10 % IV SOLN
INTRAVENOUS | Status: DC
  Filled 2019-03-09: qty 1296

## 2019-03-09 NOTE — Progress Notes (Signed)
Pharmacy Antibiotic Note  Gina [name] is a 58 y.o. female with perforated gastric ulcer & complicated post-op course.  Pharmacy has been consulted for Unasyn dosing.  03/09/2019:  D37 total abx (D8 Unasyn, D34 Eraxis D7 Bactrim)  Afebrile, WBC >>10.6, Scr stable  K+ 3.7 today- K removed from TPN  Plan: Continue Bactrim 200mg  IV q6h Continue Unasyn 3gm IV q8h Eraxis 100mg  IV q24h Monitor renal function, K+ and cx data   Height: 5\' 2"  (157.5 cm) Weight: 117 lb 8.1 oz (53.3 kg) IBW/kg (Calculated) : 50.1  Temp (24hrs), Avg:97.8 F (36.6 C), Min:97.6 F (36.4 C), Max:98.1 F (36.7 C)  Recent Labs  Lab 03/05/19 0207 03/05/19 1713 03/06/19 1019 03/06/19 1138 03/07/19 0218 03/08/19 0212 03/09/19 0222  WBC 13.2* 9.9 SPECIMEN CONTAMINATED, UNABLE TO PERFORM TEST(S). 10.6* 9.8  --   --   CREATININE 0.85  --   --  0.74 0.72 0.73 0.69    Estimated Creatinine Clearance: 61.4 mL/min (by C-G formula based on SCr of 0.69 mg/dL).    Allergies  Allergen Reactions  . Bee Venom Swelling    Swells up with bee stings   . Metformin And Related Other (See Comments)    Upset stomach    Antimicrobials this admission: 11/22 Vanc/Cefepime/Flagyl >> 11/23   Zosyn 11/23 >> 12/06 >> 12/8 >> 12/19 11/22 Fluconazole >> Eraxis 11/24 >> 12/15  12/17>> 12/19 Unasyn>>  12/21 Bactrim>>  Microbiology results: 11/22 BCx x1: C glabrata S-echinocandins, Diflucan (MIC 16; dose dependent) - MICs for AmphoB, Vori, Itra, Posa, flucytosine, but no breakpoints given 11/22 UCx: Klebsiella (R to ampicillin, I to nitro) 11/22 Cov2: negative 11/23 peritoneal culture: C.albicans, C glabrata 11/23 MRSA PCR: negative 11/25 HCV Ab+ 11/24 BCx: NG-final 11/28 BCx: ng-final 12/8: BCx: NGF 12/16 Abscess LLQ drain catheter: enterococcus faecalis (R=Vanc; S=amp); stenotrophomonas (S= Bactrim, R-LVQ) 12/22 Pleural Fluid Cx: NGTD  Thank you for allowing pharmacy to be a part of this patient's  care.  Dolly Rias RPh 03/09/2019, 9:15 AM

## 2019-03-09 NOTE — Progress Notes (Signed)
PHARMACY - TOTAL PARENTERAL NUTRITION CONSULT NOTE   Indication: Small bowel obstruction  Patient Measurements: Height: _0  (157.5 cm) Weight: 117 lb 8.1 oz (53.3 kg) IBW/kg (Calculated) : 50.1 TPN AdjBW (KG): 57.9 Body mass index is 21.49 kg/m. Usual Weight: 55-60 kg Ideal Body Weight:  44.2 kg(Adjusted IBW for bilateral BKA)  Assessment:  44 yoF with poorly controlled DM s/p BL BKA, admitted 11/23 with AMS, n/v, abdominal pain. CT abdomen showed pneumoperitoneum and mesenteric ischemia. Underwent ex lap on 11/23 with finding of perforated prepyloric ulcer which was oversewn, along with gross contamination of the peritoneum. Hospital course complicated by DKA, AKI, acute thrombocytopenia, and C. glabrata candidemia. Patient remains with high NGT d/t bowel edema as demonstrated on UGI. TPN started on 11/30 but was stopped shortly thereafter to facilitate central line holiday for candidemia. Repeat blood cultures remain negative to date, and TPN now restarting with replacement of central line. Patient at risk for refeeding given prolonged lack of nutrition.  Glucose / Insulin: Hx DM; prev required D10 W infusion to maintain euglycemia  Current resistant Novolog scale q6h (12/5)   CBGs ( goal <150)  range 89-144, 6 units of Novolog  past 24 hrs, 30 units of regular insulin added to TPN  Electrolytes: Na 130, CO2 18, all others WNL Renal: AKI on admit, SCr now WNL  LFTs / TGs: WNL except Alk phos up to 369, Trig 153 (12/21) Prealbumin / albumin: both low (11/30), 5.2 (12/4). 5.3 ( 12/7)  7.7 (12/14), 12.7 (12/21) MIVF: NS at 10 ml/hr GI Imaging: 11/30 UGI: high NG output & bowel edema 12/15 CTpersistent leak noted on CT scan with several fluid collections in the ventral abdomen and in pelvis.  Surgeries / Procedures: 11/22 ex lap: oversew of duodenal ulcer with [name] patch 12/16 IR placed drain into ant abd collection  Central access: PICC replaced 12/3  TPN start date: resumed 12/3  (~ 2200)  Nutritional Goals (per RD recommendation on 12/11): Kcal:  2025-2330 kcal Protein:  115-130 grams Fluid:  >/= 2 L/day Goal TPN rate is 90 mL/hr (provides 129 g of protein and 2047 kcals per day) - GIR 3.3 mg/kg/min Current Nutrition:  NPO  Plan: at 1800  Continue custom TPN at 90 mL/hr (goal rate)   Electrolytes in TPN:  Increase 90mq/L Na, no K, no Ca, Magnesium 10 mEq/L , and 10 mmol/L of Phos; change Cl:Ac ratio 1:2  Standard MVI TPN on MWF only d/t nProducer, television/film/video Daily trace elements  Continue resistant SSI with q6h CBG checks and adjust as needed   Continue Novolog 30 units in TPN  [facility]   Monitor TPN labs on Mon/Thurs.    EDolly RiasRPh 03/09/2019, 9:04 AM

## 2019-03-09 NOTE — Progress Notes (Signed)
PROGRESS NOTE  Gina [name] QKM:[id number] DOB: [birth year] DOA: 02/02/2019 PCP: [name], MD   LOS: 35 days   Brief Narrative / Interim history: 58 year old female with severe protein calorie malnutrition, poorly controlled diabetes mellitus with bilateral foot ulcers, bilateral BKA, recent left phalanx amputation, schizophrenia, HTN who was admitted to the ICU with septic shock.  She was found to have perforated gastric ulcer with pneumoperitoneum also had evidence of DKA.  She was initially intubated.  She has been on broad-spectrum antibiotics, pressors, fluids, and general surgery took her for an ex lap on 11/23 with findings of perforated prepyloric ulcer which was repaired, she was also found to have contaminated peritoneal fluid which was drained.  She was extubated 11/29.  Her hospital course has been complicated by DKA, acute kidney injury, acute blood loss anemia, thrombocytopenia, intra-abdominal infection as well as bacteremia and fungemia.  General surgery continues to follow, currently on IV antibiotics.  CT of the abdomen done on 12/15 showed persistent abdominal collection/leak.  IR placed left lower quadrant anterior peritoneal drain on 12/16.  On 12/21, she has had multiple bloody bowel movements including bloody output from JP drain.  CT angiogram did not show any active bleeding but showed persistent large complex anterior peritoneal fluid collection.  GI was consulted and evaluated patient.  Since 11/24 she has been hemodynamically stable, mental status improved (she is some ICU induced delirium early on).  Due to poor prognosis palliative has been consulted and discussions were held regarding goals of care with family has been reluctant.  Palliative following.  Plans are in place for [facility].  ID was consulted for her infections and recommended current antibiotic regimen to continue till January 4 and then be monitored off antibiotics.  She  is also on TPN   Subjective / 24h Interval events: She is feeling relatively well this morning, alert, pleasant.  Denies any chest pain, no abdominal pain, no nausea or vomiting  Assessment & Plan: Principal Problem Septic shock due to ruptured gastric ulcer with acute peritonitis, hospital course complicated by bacteremia, fungemia -patient was initially admitted to the ICU.  General surgery was consulted and status post exploratory laparotomy with oversew of duodenal ulcer with [name] patch on 11/23 for perforated prepyloric ulcer with gross contamination.  Blood cultures have shown Candida glabrata, urine culture showed Klebsiella and peritoneal fluid showed Candida albicans.  Ophthalmology was also consulted and there is no evidence of fungal endophthalmitis.  Repeat CT scan of the abdomen pelvis on 12/15 and 12/21 showed persistent large fluid collection in the anterior abdomen, possible leak.  IR did place a left lower quadrant anterior peritoneal drain catheter on 12/16.  Cultures showed persistent Candida glabrata, Enterococcus faecalis, stenotrophomonas.  ID recommends to continue current antibiotics with Unasyn, Bactrim, anidulafungin until January 4 and then monitor off.  She also has a right lower quadrant JP drain with suction and midline surgical wounds along with an NG tube.  She receives feeding via PICC line and is getting TPN  Active Problems Concern for GI bleed-on 12/21 had multiple bloody bowel movements and there was some bloody output in the JP drain.  CT angiogram did not show any active bleeding she required 2 units of packed red blood cells.  This seems to have spontaneously resolved, GI evaluated patient.  Continue to monitor hemoglobin  Moderate right-sided pleural effusion-stable respiratory status, continue to monitor, she underwent a thoracentesis 12/22 700 mL fluid.  Ischemic changes  bilateral BKA sites -Likely due to severe septic shock, was seen by vascular surgery  and orthopedic surgery.  Wound stable she will eventually need AKA.  Acute kidney injury-back to baseline now  ICU delirium-resolved, likely due to infectious etiology  Type 2 diabetes mellitus, hyperglycemia, DKA while hospitalized-hemoglobin A1c was 15.2.  Continue current insulin regimen  CBG (last 3)  Recent Labs    03/08/19 1749 03/08/19 2357 03/09/19 0610  GLUCAP 89 124* 115*   Severe protein calorie malnutrition /hypoalbuminemia-continue TPN per pharmacy  Anemia -multifactorial due to blood loss, critical illness-total 5 units transfused during this hospital stay.  Continue to monitor  Acute thrombocytopenia -due to septic shock, resolved  Status post bilateral BKA, left hallux amputation-hand surgery/orthopedic surgery followed  Positive hep C-follow-up as an outpatient when more stable  Debility/deconditioning/poor prognosis-very complex hospital course and remains quite deconditioned.  Palliative following.   Pressure Injury 12/26/18 Buttocks Left Stage II -  Partial thickness loss of dermis presenting as a shallow open ulcer with a red, pink wound bed without slough. (Active)  12/26/18 0630  Location: Buttocks  Location Orientation: Left  Staging: Stage II -  Partial thickness loss of dermis presenting as a shallow open ulcer with a red, pink wound bed without slough.  Wound Description (Comments):   Present on Admission: Yes     Pressure Injury 12/26/18 Buttocks Right;Lower Stage II -  Partial thickness loss of dermis presenting as a shallow open ulcer with a red, pink wound bed without slough. (Active)  12/26/18 0630  Location: Buttocks  Location Orientation: Right;Lower  Staging: Stage II -  Partial thickness loss of dermis presenting as a shallow open ulcer with a red, pink wound bed without slough.  Wound Description (Comments):   Present on Admission: Yes     Pressure Injury 02/03/19 Coccyx Right;Left;Medial Unstageable - Full thickness tissue loss in  which the base of the ulcer is covered by slough (yellow, tan, gray, green or brown) and/or eschar (tan, brown or black) in the wound bed. pt has 3 small injuri (Active)  02/03/19 0230  Location: Coccyx  Location Orientation: Right;Left;Medial  Staging: Unstageable - Full thickness tissue loss in which the base of the ulcer is covered by slough (yellow, tan, gray, green or brown) and/or eschar (tan, brown or black) in the wound bed.  Wound Description (Comments): pt has 3 small injuries all in the same area, one is medial and two are on either side of it  Present on Admission: Yes   Scheduled Meds: . sodium chloride   Intravenous Once  . sodium chloride   Intravenous Once  . chlorhexidine gluconate (MEDLINE KIT)  15 mL Mouth Rinse BID  . Chlorhexidine Gluconate Cloth  6 each Topical Daily  . collagenase   Topical Daily  . Gerhardt's butt cream   Topical TID  . insulin aspart  0-20 Units Subcutaneous Q6H  . metoprolol tartrate  2.5 mg Intravenous Q6H  . pantoprazole (PROTONIX) IV  40 mg Intravenous Q12H  . sodium chloride flush  10-40 mL Intracatheter Q12H  . sodium chloride flush  5 mL Intracatheter Q8H   Continuous Infusions: . sodium chloride 10 mL/hr at 03/08/19 2242  . sodium chloride 10 mL/hr at 03/04/19 0202  . ampicillin-sulbactam (UNASYN) IV Stopped (03/09/19 0217)  . anidulafungin Stopped (03/08/19 2225)  . sulfamethoxazole-trimethoprim 200 mg of trimethoprim (03/09/19 0844)  . TPN ADULT (ION) 90 mL/hr at 03/08/19 1800  . TPN ADULT (ION)     PRN Meds:.acetaminophen, albuterol,  dextrose, hydrALAZINE, HYDROcodone-acetaminophen, lip balm, menthol-cetylpyridinium, morphine injection, ondansetron (ZOFRAN) IV, phenol, sodium chloride flush  DVT prophylaxis: SCD Code Status: DNR Family Communication: no family at bedside  Disposition Plan: [facility] Monday if stable  Consultants:  General surgery PCCM ID Orthopedic surgery Palliative care  Procedures:  Exploratory  laparotomy  Microbiology  SARS-CoV-2 -11/23 Blood cultures 11/22-Candida glabrata Urine cultures 11/22-Klebsiella Peritoneal fluid culture 11/23-Candida albicans, Candida glabrata Blood cultures 11/24-negative Blood cultures 11/22-Candida glabrata Blood cultures 11/28, 12/8-no growth Peritoneal/left lower quadrant abscess drain catheter 12/16-Enterococcus faecalis, Stenotrophomonas maltophilia, Candida glabrata Right pleural fluid culture 12/22-no growth  Antimicrobials: Unasyn, Bactrim, Anidulafungin -to continue till January 4   Objective: Vitals:   03/09/19 0405 03/09/19 0500 03/09/19 0600 03/09/19 0901  BP:  119/64 127/62   Pulse:  81 77   Resp:  15 12   Temp: 97.7 F (36.5 C)   97.7 F (36.5 C)  TempSrc: Oral   Axillary  SpO2:      Weight:      Height:        Intake/Output Summary (Last 24 hours) at 03/09/2019 1051 Last data filed at 03/09/2019 0600 Gross per 24 hour  Intake 3695.13 ml  Output 2370 ml  Net 1325.13 ml   Filed Weights   03/01/19 0500 03/02/19 0453 03/03/19 0500  Weight: 51.7 kg 51.7 kg 53.3 kg    Examination:  Constitutional: NAD, chronically ill-appearing Eyes: no scleral icterus ENMT: Mucous membranes are moist.  Neck: normal, supple Respiratory: clear to auscultation bilaterally, no wheezing, no crackles. Normal respiratory effort.  Cardiovascular: Regular rate and rhythm, no murmurs / rubs / gallops. No LE edema.  Abdomen: non distended, no tenderness. Bowel sounds positive.  Drain in place, open Wound Musculoskeletal: no clubbing / cyanosis.  Skin: no rashes Neurologic: Nonfocal, equal strength.  Generalized weakness present Psychiatric: Alert and oriented x3   Data Reviewed: I have independently reviewed following labs and imaging studies   CBC: Recent Labs  Lab 03/03/19 0201 03/05/19 0207 03/05/19 1713 03/06/19 1019 03/06/19 1138 03/07/19 0218  WBC 9.2 13.2* 9.9 SPECIMEN CONTAMINATED, UNABLE TO PERFORM TEST(S). 10.6* 9.8   NEUTROABS 5.9 9.9*  --  SPECIMEN CONTAMINATED, UNABLE TO PERFORM TEST(S). 6.8 6.0  HGB 8.8* 6.2* 9.0* SPECIMEN CONTAMINATED, UNABLE TO PERFORM TEST(S). 9.5* 9.8*  HCT 29.5* 20.8* 27.6* SPECIMEN CONTAMINATED, UNABLE TO PERFORM TEST(S). 29.9* 31.5*  MCV 95.8 96.3 94.5 SPECIMEN CONTAMINATED, UNABLE TO PERFORM TEST(S). 92.0 93.8  PLT 655* 450* 380 SPECIMEN CONTAMINATED, UNABLE TO PERFORM TEST(S). 382 622   Basic Metabolic Panel: Recent Labs  Lab 03/03/19 0201 03/05/19 0207 03/06/19 1019 03/06/19 1138 03/07/19 0218 03/08/19 0212 03/09/19 0222  NA 143 139  --  134* 133* 132* 130*  K 4.4 4.9  --  5.2* 4.6 4.1 3.7  CL 111 110  --  104 103 102 105  CO2 25 21*  --  24 20* 19* 18*  GLUCOSE 133* 111*  --  108* 97 76 134*  BUN 44* 60*  --  46* 42* 49* 46*  CREATININE 0.54 0.85  --  0.74 0.72 0.73 0.69  CALCIUM 9.2 8.5*  --  8.2* 8.6* 8.7* 8.3*  MG 2.2  --  SPECIMEN CONTAMINATED, UNABLE TO PERFORM TEST(S). 2.2  --   --   --   PHOS 3.5  --  SPECIMEN CONTAMINATED, UNABLE TO PERFORM TEST(S). 3.6  --   --   --    Liver Function Tests: Recent Labs  Lab 03/03/19 0201 03/06/19 1138  AST 35 38  ALT 33 38  ALKPHOS 369* 293*  BILITOT 0.6 0.3  PROT 6.9 6.6  ALBUMIN 1.5* 1.4*   Coagulation Profile: No results for input(s): INR, PROTIME in the last 168 hours. HbA1C: No results for input(s): HGBA1C in the last 72 hours. CBG: Recent Labs  Lab 03/08/19 0543 03/08/19 1215 03/08/19 1749 03/08/19 2357 03/09/19 0610  GLUCAP 95 144* 89 124* 115*    Recent Results (from the past 240 hour(s))  Body fluid culture     Status: None   Collection Time: 03/04/19 12:54 PM   Specimen: Lung, Right; Pleural Fluid  Result Value Ref Range Status   Specimen Description   Final    PLEURAL RIGHT Performed at [facility] [address] [place], [place] [address]    Special Requests   Final    NONE Performed at [facility], [place] [address] [place], [place]  [address]    Gram Stain   Final    RARE WBC PRESENT,BOTH PMN AND MONONUCLEAR NO ORGANISMS SEEN    Culture   Final    NO GROWTH 3 DAYS Performed at [facility], [place] [address], [place], [place] [address]    Report Status 03/07/2019 FINAL  Final     Radiology Studies: No results found.  Marzetta Board, MD, PhD [facility]  Between 7 am - 7 pm I am available, please contact me via Amion or Securechat  Between 7 pm - 7 am I am not available, please contact night coverage MD/APP via Amion

## 2019-03-09 NOTE — Progress Notes (Addendum)
[place] [facility] Office:  [phone] General Surgery Progress Note   LOS: 35 days  POD -  35 Days Post-Op  Assessment and Plan: 1.  EXPLORATORY LAPAROTOMY WITH OVERSEW OF DUODENAL ULCER with [name] patch - 02/03/2019 - Lucia Gaskins  persistent leak noted on CT scan with several fluid collections in the ventral abdomen and in pelvis.  RUQ drainage is a fair amount  Midline wound is not healing  On Unasyn/Eraxis/Bactrim - ID recommending antibiotics until 03/17/2019  Prognosis continues to be poor - agree with consideration of comfort care   2. Poorly controlled DM2- A1c15.2 3.  S/p bilateral BKA  Ischemic changes to both LE- defer to medicineandDr. Duda  4.  Acute onCKD- improved  Creatinine - 0.69 - 03/09/2019 5.  HTN 6.  Schizophrenia  7. Severe malnutrition - on TPN  Prealbumin12.7 on 03/03/2019 8.  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  Candida glabratabacteremia-per ID, on antifungal 10.  Unstageable sacral wound-cont santyl. 11.  Anemia  Hgb - 9.8 - 03/07/2019   Principal Problem:   Septic shock (HCC) Active Problems:   DKA (diabetic ketoacidoses) (HCC)   Severe protein-calorie malnutrition ([place])   Acute kidney injury ([place])   Pressure injury of skin   Perforated gastric ulcer ([place])   Acute respiratory failure (HCC)   Duodenal perforation (HCC)   Below-knee amputee ([place])   Atherosclerosis of native arteries of extremities with gangrene, right leg (HCC)   Atherosclerosis of native arteries of extremities with gangrene, left leg (HCC)   Intra-abdominal fluid collection  Subjective:  She is doing well and has no complaints.    Objective:   Vitals:   03/09/19 0500 03/09/19 0600  BP: 119/64 127/62  Pulse: 81 77  Resp: 15 12  Temp:    SpO2:       Intake/Output from previous day:  12/26 0701 - 12/27 0700 In: 3695.1 [I.V.:2139; IV Piggyback:1556.1] Out: 2370 [Urine:1650; Emesis/NG output:470;  Drains:250]  Intake/Output this shift:  No intake/output data recorded.   Physical Exam:   General: AA F who arouses and respondes but is not oriented.    HEENT: Normal. Pupils equal. .   Lungs: Clear   Abdomen: Soft - RUQ/LUQ - 150/100 cc last 24 hours   Lab Results:    Recent Labs    03/06/19 1138 03/07/19 0218  WBC 10.6* 9.8  HGB 9.5* 9.8*  HCT 29.9* 31.5*  PLT 382 367    BMET   Recent Labs    03/08/19 0212 03/09/19 0222  NA 132* 130*  K 4.1 3.7  CL 102 105  CO2 19* 18*  GLUCOSE 76 134*  BUN 49* 46*  CREATININE 0.73 0.69  CALCIUM 8.7* 8.3*    PT/INR  No results for input(s): LABPROT, INR in the last 72 hours.  ABG  No results for input(s): PHART, HCO3 in the last 72 hours.  Invalid input(s): PCO2, PO2   Studies/Results:  No results found.   Anti-infectives:   Anti-infectives (From admission, onward)   Start     Dose/Rate Route Frequency Ordered Stop   03/03/19 1600  sulfamethoxazole-trimethoprim (BACTRIM) 200 mg of trimethoprim in dextrose 5 % 250 mL IVPB     200 mg of trimethoprim 262.5 mL/hr over 60 Minutes Intravenous Every 6 hours 03/03/19 1457 03/18/19 0159   03/01/19 1800  Ampicillin-Sulbactam (UNASYN) 3 g in sodium chloride 0.9 % 100 mL IVPB     3 g 200 mL/hr over 30 Minutes Intravenous Every  8 hours 03/01/19 1304 03/17/19 2359   02/28/19 1600  anidulafungin (ERAXIS) 100 mg in sodium chloride 0.9 % 100 mL IVPB     100 mg 78 mL/hr over 100 Minutes Intravenous Every 24 hours 02/27/19 1346 03/17/19 2359   02/27/19 1600  anidulafungin (ERAXIS) 200 mg in sodium chloride 0.9 % 200 mL IVPB     200 mg 78 mL/hr over 200 Minutes Intravenous  Once 02/27/19 1346 02/27/19 1840   02/18/19 1800  piperacillin-tazobactam (ZOSYN) IVPB 3.375 g  Status:  Discontinued     3.375 g 12.5 mL/hr over 240 Minutes Intravenous Every 8 hours 02/18/19 1652 03/01/19 1259   02/05/19 1000  anidulafungin (ERAXIS) 100 mg in sodium chloride 0.9 % 100 mL IVPB  Status:   Discontinued     100 mg 78 mL/hr over 100 Minutes Intravenous Every 24 hours 02/04/19 0840 02/25/19 1604   02/04/19 1000  anidulafungin (ERAXIS) 200 mg in sodium chloride 0.9 % 200 mL IVPB     200 mg 78 mL/hr over 200 Minutes Intravenous  Once 02/04/19 0840 02/04/19 1301   02/04/19 0200  fluconazole (DIFLUCAN) IVPB 200 mg  Status:  Discontinued     200 mg 100 mL/hr over 60 Minutes Intravenous Every 24 hours 02/02/19 2302 02/04/19 0840   02/03/19 1800  vancomycin (VANCOCIN) IVPB 750 mg/150 ml premix  Status:  Discontinued     750 mg 150 mL/hr over 60 Minutes Intravenous Every 24 hours 02/03/19 0241 02/03/19 0242   02/03/19 1800  vancomycin (VANCOCIN) IVPB 750 mg/150 ml premix  Status:  Discontinued     750 mg 150 mL/hr over 60 Minutes Intravenous Every 24 hours 02/03/19 0242 02/03/19 0858   02/03/19 0600  piperacillin-tazobactam (ZOSYN) IVPB 3.375 g     3.375 g 12.5 mL/hr over 240 Minutes Intravenous Every 8 hours 02/03/19 0243 02/17/19 0205   02/02/19 2315  fluconazole (DIFLUCAN) IVPB 400 mg     400 mg 100 mL/hr over 120 Minutes Intravenous STAT 02/02/19 2301 02/03/19 0218   02/02/19 1800  ceFEPIme (MAXIPIME) 2 g in sodium chloride 0.9 % 100 mL IVPB     2 g 200 mL/hr over 30 Minutes Intravenous  Once 02/02/19 1745 02/02/19 2030   02/02/19 1800  metroNIDAZOLE (FLAGYL) IVPB 500 mg     500 mg 100 mL/hr over 60 Minutes Intravenous  Once 02/02/19 1745 02/02/19 2237   02/02/19 1800  vancomycin (VANCOCIN) IVPB 1000 mg/200 mL premix  Status:  Discontinued     1,000 mg 200 mL/hr over 60 Minutes Intravenous  Once 02/02/19 1745 02/02/19 1747   02/02/19 1800  vancomycin (VANCOCIN) 1,250 mg in sodium chloride 0.9 % 250 mL IVPB     1,250 mg 166.7 mL/hr over 90 Minutes Intravenous  Once 02/02/19 1747 02/02/19 2237      Alphonsa Overall, MD, [facility] Surgery Office: [phone] 03/09/2019

## 2019-03-10 ENCOUNTER — Inpatient Hospital Stay
Admission: RE | Admit: 2019-03-10 | Discharge: 2019-04-22 | Disposition: A | Discharge status: Skilled Nursing Facility | Payer: Self-pay | Source: Ambulatory Visit | Attending: Internal Medicine | Admitting: Internal Medicine

## 2019-03-10 DIAGNOSIS — K8050 Calculus of bile duct without cholangitis or cholecystitis without obstruction: Secondary | ICD-10-CM

## 2019-03-10 DIAGNOSIS — Z95828 Presence of other vascular implants and grafts: Secondary | ICD-10-CM

## 2019-03-10 DIAGNOSIS — R748 Abnormal levels of other serum enzymes: Secondary | ICD-10-CM

## 2019-03-10 DIAGNOSIS — K819 Cholecystitis, unspecified: Secondary | ICD-10-CM

## 2019-03-10 DIAGNOSIS — J9690 Respiratory failure, unspecified, unspecified whether with hypoxia or hypercapnia: Secondary | ICD-10-CM

## 2019-03-10 DIAGNOSIS — T85598A Other mechanical complication of other gastrointestinal prosthetic devices, implants and grafts, initial encounter: Secondary | ICD-10-CM

## 2019-03-10 DIAGNOSIS — A419 Sepsis, unspecified organism: Secondary | ICD-10-CM

## 2019-03-10 LAB — CBC
HCT: 29.5 % — ABNORMAL LOW (ref 36.0–46.0)
Hemoglobin: 9 g/dL — ABNORMAL LOW (ref 12.0–15.0)
MCH: 28.7 pg (ref 26.0–34.0)
MCHC: 30.5 g/dL (ref 30.0–36.0)
MCV: 93.9 fL (ref 80.0–100.0)
Platelets: 424 10*3/uL — ABNORMAL HIGH (ref 150–400)
RBC: 3.14 MIL/uL — ABNORMAL LOW (ref 3.87–5.11)
RDW: 14.7 % (ref 11.5–15.5)
WBC: 11.5 10*3/uL — ABNORMAL HIGH (ref 4.0–10.5)
nRBC: 0 % (ref 0.0–0.2)

## 2019-03-10 LAB — MAGNESIUM
Magnesium: 2.2 mg/dL (ref 1.7–2.4)
Magnesium: 2 mg/dL (ref 1.7–2.4)

## 2019-03-10 LAB — DIFFERENTIAL
Abs Immature Granulocytes: 0.77 10*3/uL — ABNORMAL HIGH (ref 0.00–0.07)
Basophils Absolute: 0.1 10*3/uL (ref 0.0–0.1)
Basophils Relative: 0 %
Eosinophils Absolute: 0.2 10*3/uL (ref 0.0–0.5)
Eosinophils Relative: 2 %
Immature Granulocytes: 7 %
Lymphocytes Relative: 13 %
Lymphs Abs: 1.5 10*3/uL (ref 0.7–4.0)
Monocytes Absolute: 1.6 10*3/uL — ABNORMAL HIGH (ref 0.1–1.0)
Monocytes Relative: 14 %
Neutro Abs: 7.3 10*3/uL (ref 1.7–7.7)
Neutrophils Relative %: 64 %

## 2019-03-10 LAB — BASIC METABOLIC PANEL
Anion gap: 8 (ref 5–15)
BUN: 47 mg/dL — ABNORMAL HIGH (ref 6–20)
CO2: 18 mmol/L — ABNORMAL LOW (ref 22–32)
Calcium: 8.5 mg/dL — ABNORMAL LOW (ref 8.9–10.3)
Chloride: 107 mmol/L (ref 98–111)
Creatinine, Ser: 0.67 mg/dL (ref 0.44–1.00)
GFR calc Af Amer: >60 mL/min (ref >60)
GFR calc non Af Amer: >60 mL/min (ref >60)
Glucose, Bld: 103 mg/dL — ABNORMAL HIGH (ref 70–99)
Potassium: 3.4 mmol/L — ABNORMAL LOW (ref 3.5–5.1)
Sodium: 133 mmol/L — ABNORMAL LOW (ref 135–145)

## 2019-03-10 LAB — PHOSPHORUS: Phosphorus: 3.7 mg/dL (ref 2.5–4.6)

## 2019-03-10 LAB — GLUCOSE, CAPILLARY: Glucose-Capillary: 102 mg/dL — ABNORMAL HIGH (ref 70–99)

## 2019-03-10 LAB — PREALBUMIN: Prealbumin: 19.2 mg/dL (ref 18–38)

## 2019-03-10 LAB — TRIGLYCERIDES: Triglycerides: 117 mg/dL (ref <150)

## 2019-03-10 LAB — PATHOLOGIST SMEAR REVIEW

## 2019-03-10 MED ORDER — INSULIN ASPART 100 UNIT/ML ~~LOC~~ SOLN: 0.0000 [IU] | Freq: Four times a day (QID) | SUBCUTANEOUS | 11 refills | Status: DC

## 2019-03-10 MED ORDER — SODIUM CHLORIDE 0.9 % IV SOLN: 100.0000 mg | INTRAVENOUS | Status: DC

## 2019-03-10 MED ORDER — HYDROCODONE-ACETAMINOPHEN 5-325 MG PO TABS: 1.0000 | ORAL_TABLET | ORAL | 0 refills | Status: DC | PRN

## 2019-03-10 MED ORDER — PANTOPRAZOLE SODIUM 40 MG IV SOLR: 40.0000 mg | Freq: Two times a day (BID) | INTRAVENOUS | Status: DC

## 2019-03-10 MED ORDER — TRAVASOL 10 % IV SOLN
INTRAVENOUS | Status: DC
  Filled 2019-03-10: qty 1296

## 2019-03-10 MED ORDER — SODIUM CHLORIDE 0.9 % IV SOLN: 3.0000 g | Freq: Three times a day (TID) | INTRAVENOUS | Status: DC

## 2019-03-10 MED ORDER — POTASSIUM CHLORIDE 10 MEQ/50ML IV SOLN
10.0000 meq | INTRAVENOUS | Status: AC
  Administered 2019-03-10 (×4): 10 meq via INTRAVENOUS
  Filled 2019-03-10 (×4): qty 50

## 2019-03-10 MED ORDER — SULFAMETHOXAZOLE-TRIMETHOPRIM 400-80 MG/5ML IV SOLN: 200.0000 mg | Freq: Four times a day (QID) | INTRAVENOUS | Status: DC

## 2019-03-10 NOTE — TOC Progression Note (Addendum)
Transition of Care [facility]) - Progression Note    Patient Details  Name: Gina [name] MRN: [id number] Date of Birth: 1960/06/07  [facility] CM/SW Contact  Heide Brossart, Marjie Skiff, RN Phone Number: 03/10/2019, 10:39 AM  Clinical Narrative:    Per MD, plan is for pt to go to [facility] and daughter has been called to confirm. This CM contacted [facility] liaison to ensure [facility] Josem Kaufmann is still good. Per liaison, Josem Kaufmann is still good and [facility] has a bed available today. MD and RN made aware. Pt to go to room 5E01, with admitting MD Merton Border. RN to call report to [phone]. [facility] transport to be set up by [employer] operational officer.     Barriers to Discharge: No Barriers Identified  Expected Discharge Plan and Services                             DME Agency: NA       HH Arranged: NA [place] Agency: NA         Social Determinants of Health (SDOH) Interventions    Readmission Risk Interventions Readmission Risk Prevention Plan 02/10/2019 11/07/2017  Post Dischage Appt - Patient refused  Medication Screening - Complete  Transportation Screening Complete Complete  PCP follow-up - Patient refused  PCP or Specialist Appt within 3-5 Days Not Complete -  Not Complete comments DC date unknown- pt established with PCP -  [place] or Home Care Consult Complete -  Social Work Consult for [place] Planning/Counseling Not Complete -  SW consult not completed comments awaiting call back from daughter and APS -  Palliative Care Screening Not Complete -  Palliative Care Screening Not Complete Comments pending need -  Medication Review ([place]) Referral to Pharmacy -  Some recent data might be hidden

## 2019-03-10 NOTE — Progress Notes (Signed)
RN was unable to obtain 0600 CBG through a fingerstick. Both the RN and the NT tried and were unable to draw blood due to calluses on the fingertips . [facility] on-call was notified and no new orders were given.

## 2019-03-10 NOTE — Progress Notes (Signed)
36 Days Post-Op   Subjective/Chief Complaint: No complaints Talked with daughter on phone    Objective: Vital signs in last 24 hours: Temp:  [97.5 F (36.4 C)-98.1 F (36.7 C)] 98 F (36.7 C) (12/28 0800) Pulse Rate:  [75-91] 78 (12/28 0800) Resp:  [9-27] 18 (12/28 0800) BP: (110-155)/(46-79) 110/75 (12/28 0800) SpO2:  [99 %-100 %] 100 % (12/28 0800) Last BM Date: 03/03/19  Intake/Output from previous day: 12/27 0701 - 12/28 0700 In: 3526.2 [I.V.:2124.4; IV Piggyback:1396.8] Out: 1695 [Urine:725; Emesis/NG output:600; Drains:370] Intake/Output this shift: Total I/O In: -  Out: 100 [Drains:100]   General:  arouses and respondes but is not oriented.                          HEENT: Normal. Pupils equal. .                       Lungs: Clear                         Abdomen: Soft - RUQ/LUQ - 370  cc last 24 hours Lab Results:  Recent Labs    03/10/19 0241  WBC 11.5*  HGB 9.0*  HCT 29.5*  PLT 424*   BMET Recent Labs    03/09/19 0222 03/10/19 0241  NA 130* 133*  K 3.7 3.4*  CL 105 107  CO2 18* 18*  GLUCOSE 134* 103*  BUN 46* 47*  CREATININE 0.69 0.67  CALCIUM 8.3* 8.5*   PT/INR No results for input(s): LABPROT, INR in the last 72 hours. ABG No results for input(s): PHART, HCO3 in the last 72 hours.  Invalid input(s): PCO2, PO2  Studies/Results: No results found.  Anti-infectives: Anti-infectives (From admission, onward)   Start     Dose/Rate Route Frequency Ordered Stop   03/03/19 1600  sulfamethoxazole-trimethoprim (BACTRIM) 200 mg of trimethoprim in dextrose 5 % 250 mL IVPB     200 mg of trimethoprim 262.5 mL/hr over 60 Minutes Intravenous Every 6 hours 03/03/19 1457 03/18/19 0159   03/01/19 1800  Ampicillin-Sulbactam (UNASYN) 3 g in sodium chloride 0.9 % 100 mL IVPB     3 g 200 mL/hr over 30 Minutes Intravenous Every 8 hours 03/01/19 1304 03/17/19 2359   02/28/19 1600  anidulafungin (ERAXIS) 100 mg in sodium chloride 0.9 % 100 mL IVPB     100  mg 78 mL/hr over 100 Minutes Intravenous Every 24 hours 02/27/19 1346 03/17/19 2359   02/27/19 1600  anidulafungin (ERAXIS) 200 mg in sodium chloride 0.9 % 200 mL IVPB     200 mg 78 mL/hr over 200 Minutes Intravenous  Once 02/27/19 1346 02/27/19 1840   02/18/19 1800  piperacillin-tazobactam (ZOSYN) IVPB 3.375 g  Status:  Discontinued     3.375 g 12.5 mL/hr over 240 Minutes Intravenous Every 8 hours 02/18/19 1652 03/01/19 1259   02/05/19 1000  anidulafungin (ERAXIS) 100 mg in sodium chloride 0.9 % 100 mL IVPB  Status:  Discontinued     100 mg 78 mL/hr over 100 Minutes Intravenous Every 24 hours 02/04/19 0840 02/25/19 1604   02/04/19 1000  anidulafungin (ERAXIS) 200 mg in sodium chloride 0.9 % 200 mL IVPB     200 mg 78 mL/hr over 200 Minutes Intravenous  Once 02/04/19 0840 02/04/19 1301   02/04/19 0200  fluconazole (DIFLUCAN) IVPB 200 mg  Status:  Discontinued     200 mg 100 mL/hr over  60 Minutes Intravenous Every 24 hours 02/02/19 2302 02/04/19 0840   02/03/19 1800  vancomycin (VANCOCIN) IVPB 750 mg/150 ml premix  Status:  Discontinued     750 mg 150 mL/hr over 60 Minutes Intravenous Every 24 hours 02/03/19 0241 02/03/19 0242   02/03/19 1800  vancomycin (VANCOCIN) IVPB 750 mg/150 ml premix  Status:  Discontinued     750 mg 150 mL/hr over 60 Minutes Intravenous Every 24 hours 02/03/19 0242 02/03/19 0858   02/03/19 0600  piperacillin-tazobactam (ZOSYN) IVPB 3.375 g     3.375 g 12.5 mL/hr over 240 Minutes Intravenous Every 8 hours 02/03/19 0243 02/17/19 0205   02/02/19 2315  fluconazole (DIFLUCAN) IVPB 400 mg     400 mg 100 mL/hr over 120 Minutes Intravenous STAT 02/02/19 2301 02/03/19 0218   02/02/19 1800  ceFEPIme (MAXIPIME) 2 g in sodium chloride 0.9 % 100 mL IVPB     2 g 200 mL/hr over 30 Minutes Intravenous  Once 02/02/19 1745 02/02/19 2030   02/02/19 1800  metroNIDAZOLE (FLAGYL) IVPB 500 mg     500 mg 100 mL/hr over 60 Minutes Intravenous  Once 02/02/19 1745 02/02/19 2237    02/02/19 1800  vancomycin (VANCOCIN) IVPB 1000 mg/200 mL premix  Status:  Discontinued     1,000 mg 200 mL/hr over 60 Minutes Intravenous  Once 02/02/19 1745 02/02/19 1747   02/02/19 1800  vancomycin (VANCOCIN) 1,250 mg in sodium chloride 0.9 % 250 mL IVPB     1,250 mg 166.7 mL/hr over 90 Minutes Intravenous  Once 02/02/19 1747 02/02/19 2237      Assessment/Plan: s/p Procedure(s): EXPLORATORY LAPAROTOMY WITH OVERSEW OF DUODENAL ULCER (N/A) 1.  EXPLORATORY LAPAROTOMY WITH OVERSEW OF DUODENAL ULCER with Gina [name] patch - 02/03/2019 - Gina [name]             persistent leak noted on CT scan with several fluid collections in the ventral abdomen and in pelvis.             RUQ drainage is a fair amount             Midline wound is not healing             On Unasyn/Eraxis/Bactrim - ID recommending antibiotics until 03/17/2019             Prognosis continues to be poor - agree with consideration of comfort care Discussed on phone with daughter unlikely to [name] anytime soon so no further role of surgery Unclear if she will ever be able to have anything done               2. Poorly controlled DM2- A1c15.2 3.  S/p bilateral BKA             Ischemic changes to both LE- defer to medicineandDr. Duda  4.  Acute onCKD- improved             Creatinine - 0.69 - 03/09/2019 5.  HTN 6.  Schizophrenia  7. Severe malnutrition - on TPN             Prealbumin12.7 on 03/03/2019 8.  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  Candida glabratabacteremia-per ID, on antifungal 10.  Unstageable sacral wound-cont santyl. 11.  Anemia             Hgb - 9.8 - 03/07/2019              Principal Problem:   Septic shock (HCC) Active Problems:  DKA (diabetic ketoacidoses) (HCC)   Severe protein-calorie malnutrition ([place])   Acute kidney injury ([place])   Pressure injury of skin   Perforated gastric ulcer ([place])   Acute respiratory failure ([place])   Duodenal perforation  (HCC)   Below-knee amputee ([place])   Atherosclerosis of native arteries of extremities with gangrene, right leg (HCC)   Atherosclerosis of native arteries of extremities with gangrene, left leg ([place])   Intra-abdominal fluid collection  LOS: 36 days    Gina [name] 03/10/2019

## 2019-03-10 NOTE — Progress Notes (Signed)
PHARMACY - TOTAL PARENTERAL NUTRITION CONSULT NOTE   Indication: Small bowel obstruction  Patient Measurements: Height: _0  (157.5 cm) Weight: 117 lb 8.1 oz (53.3 kg) IBW/kg (Calculated) : 50.1 TPN AdjBW (KG): 57.9 Body mass index is 21.49 kg/m. Usual Weight: 55-60 kg Ideal Body Weight:  44.2 kg(Adjusted IBW for bilateral BKA)  Assessment:  21 yoF with poorly controlled DM s/p BL BKA, admitted 11/23 with AMS, n/v, abdominal pain. CT abdomen showed pneumoperitoneum and mesenteric ischemia. Underwent ex lap on 11/23 with finding of perforated prepyloric ulcer which was oversewn, along with gross contamination of the peritoneum. Hospital course complicated by DKA, AKI, acute thrombocytopenia, and C. glabrata candidemia. Patient remains with high NGT d/t bowel edema as demonstrated on UGI. TPN started on 11/30 but was stopped shortly thereafter to facilitate central line holiday for candidemia. Repeat blood cultures remain negative to date, and TPN now restarting with replacement of central line. Patient at risk for refeeding given prolonged lack of nutrition.  Glucose / Insulin: Hx DM; prev required D10 W infusion to maintain euglycemia  Current resistant Novolog scale q6h (12/5)   CBGs ( goal <150)  range 99-171, 8 units of Novolog  past 24 hrs, 30 units of regular insulin added to TPN  Electrolytes: Na 133, K3.4, CO2 18, all others WNL Renal: AKI on admit, SCr now WNL  LFTs / TGs: WNL except Alk phos up to 369, Trig 117 (12/28) Prealbumin / albumin: both low (11/30), 5.2 (12/4). 5.3 ( 12/7)  7.7 (12/14), 12.7 (12/21) MIVF: NS at 10 ml/hr GI Imaging: 11/30 UGI: high NG output & bowel edema 12/15 CTpersistent leak noted on CT scan with several fluid collections in the ventral abdomen and in pelvis.  Surgeries / Procedures: 11/22 ex lap: oversew of duodenal ulcer with [name] patch 12/16 IR placed drain into ant abd collection  Central access: PICC replaced 12/3  TPN start date:  resumed 12/3 (~ 2200)  Nutritional Goals (per RD recommendation on 12/11): Kcal:  2025-2330 kcal Protein:  115-130 grams Fluid:  >/= 2 L/day Goal TPN rate is 90 mL/hr (provides 129 g of protein and 2047 kcals per day) - GIR 3.3 mg/kg/min Current Nutrition:  NPO  Plan: at 1800 KCL 45mq IV x4 now  Continue custom TPN at 90 mL/hr (goal rate)   Electrolytes in TPN:   969m/L Na, add 2038mL K, no Ca, Magnesium 10 mEq/L , and 10 mmol/L of Phos; change Cl:Ac to max acetate  Standard MVI TPN on MWF only d/t natProducer, television/film/videoaily trace elements  Continue resistant SSI with q6h CBG checks and adjust as needed   Decrease Novolog to 25 units in TPN  [facility]  Monitor TPN labs on Mon/Thurs.    EllDolly Riash 03/10/2019, 8:36 AM

## 2019-03-10 NOTE — Discharge Summary (Signed)
Physician Discharge Summary  Jaelan Teater [id number] DOB: 10-19-1960 DOA: 02/02/2019  PCP: [name], MD  Admit date: 02/02/2019 Discharge date: 03/10/2019  Admitted From: home Disposition:  [facility]  Discharge Condition: guarded CODE STATUS: DNR Diet recommendation: on TPN  HPI: Per admitting MD, Gina [name] is a 58 y.o. 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 She is accompanied by her daughter who provided history as the patient is unable to do so. Per report, the patient has been complaining of "flu-like symptoms" for the past few days. Endorses fever, chills, N/V, abdominal pain. She lives alone but has a caretaker who lives nearby. She was found on the floor today very altered. In the ED, she was hypothermic and severely hypotensive. Initial blood glucose >1000. 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 The scan also noted cholelithiasis without cholecystitis and bibasilar airspace disease. CT head NAICA. CXR wnl. While in the ED, she was given 6L IVF and started on low-dose Levophed through a peripheral IV. She was also started on insulin infusion per DKA protocol. She received vancomycin, cefepime, metronidazole and fluconazole. Surgical consult was called and I spoke with Dr. Lucia Gaskins at the bedside. The family has decided to proceed with operative intervention.  Hospital Course / Discharge diagnoses: 58 year old female with severe protein calorie malnutrition, poorly controlled diabetes mellitus with bilateral foot ulcers, bilateral BKA, recent left phalanx amputation, schizophrenia, HTN who was admitted to the ICU with septic shock.  She was  found to have perforated gastric ulcer with pneumoperitoneum also had evidence of DKA.  She was initially intubated.  She has been on broad-spectrum antibiotics, pressors, fluids, and general surgery took her for an ex lap on 11/23 with findings of perforated prepyloric ulcer which was repaired, she was also found to have contaminated peritoneal fluid which was drained.  She was extubated 11/29.  Her hospital course has been complicated by DKA, acute kidney injury, acute blood loss anemia, thrombocytopenia, intra-abdominal infection as well as bacteremia and fungemia.  General surgery continues to follow, currently on IV antibiotics.  CT of the abdomen done on 12/15 showed persistent abdominal collection/leak.  IR placed left lower quadrant anterior peritoneal drain on 12/16.  On 12/21, she has had multiple bloody bowel movements including bloody output from JP drain.  CT angiogram did not show any active bleeding but showed persistent large complex anterior peritoneal fluid collection.  GI was consulted and evaluated patient.  Since 11/24 she has been hemodynamically stable, mental status improved (she is some ICU induced delirium early on).  Due to poor prognosis palliative has been consulted and discussions were held regarding goals of care with family has been reluctant.  Palliative following.  Plans are in place for patient to be transferred [facility].  ID was consulted for her infections and recommended current antibiotic regimen to continue till January 4 and then be monitored off antibiotics.  She is also on TPN  Principal Problem Septic shock due to ruptured gastric ulcer with acute peritonitis, hospital course complicated by bacteremia, fungemia -patient was initially admitted to the ICU.  General surgery was consulted and status post exploratory laparotomy with oversew of duodenal ulcer with [name] patch on 11/23 for perforated prepyloric ulcer  with gross contamination.  Blood cultures have  shown Candida glabrata, urine culture showed Klebsiella and peritoneal fluid showed Candida albicans.  Ophthalmology was also consulted and there is no evidence of fungal endophthalmitis.  Repeat CT scan of the abdomen pelvis on 12/15 and 12/21 showed persistent large fluid collection in the anterior abdomen, possible leak.  IR did place a left lower quadrant anterior peritoneal drain catheter on 12/16.  Cultures showed persistent Candida glabrata, Enterococcus faecalis, stenotrophomonas.  ID recommends to continue current antibiotics with Unasyn, Bactrim, anidulafungin until January 4 and then monitor off.  She also has a right lower quadrant JP drain with suction and midline surgical wounds along with an NG tube.  She receives feeding via PICC line and is getting TPN  Active Problems Concern for GI bleed-on 12/21 had multiple bloody bowel movements and there was some bloody output in the JP drain.  CT angiogram did not show any active bleeding she required 2 units of packed red blood cells.  This seems to have spontaneously resolved, GI evaluated patient.  Continue to monitor hemoglobin, stable  Moderate right-sided pleural effusion-stable respiratory status, continue to monitor, she underwent a thoracentesis 12/22 700 mL fluid.  Ischemic changes bilateral BKA sites -Likely due to severe septic shock, was seen by vascular surgery and orthopedic surgery.  When stable she will eventually need AKA.  Acute kidney injury-back to baseline now  ICU delirium-resolved, likely due to infectious etiology  Type 2 diabetes mellitus, hyperglycemia, DKA while hospitalized-hemoglobin A1c was 15.2.  Continue current insulin regimen  CBG (last 3)  Recent Labs    03/09/19 1155 03/09/19 1813 03/09/19 2317  GLUCAP 171* 99 157*    Severe protein calorie malnutrition /hypoalbuminemia-continue TPN per pharmacy  Anemia -multifactorial due to blood loss, critical illness-total 5 units transfused during this  hospital stay.  Continue to monitor  Acute thrombocytopenia -due to septic shock, resolved  Status post bilateral BKA, left hallux amputation-hand surgery/orthopedic surgery followed  Positive hep C-follow-up as an outpatient when more stable  Debility/deconditioning/poor prognosis-very complex hospital course and remains quite deconditioned.  Palliative following.  Pressure Injury 12/26/18 Buttocks Left Stage II -  Partial thickness loss of dermis presenting as a shallow open ulcer with a red, pink wound bed without slough. (Active)  12/26/18 0630  Location: Buttocks  Location Orientation: Left  Staging: Stage II -  Partial thickness loss of dermis presenting as a shallow open ulcer with a red, pink wound bed without slough.  Wound Description (Comments):   Present on Admission: Yes     Pressure Injury 12/26/18 Buttocks Right;Lower Stage II -  Partial thickness loss of dermis presenting as a shallow open ulcer with a red, pink wound bed without slough. (Active)  12/26/18 0630  Location: Buttocks  Location Orientation: Right;Lower  Staging: Stage II -  Partial thickness loss of dermis presenting as a shallow open ulcer with a red, pink wound bed without slough.  Wound Description (Comments):   Present on Admission: Yes     Pressure Injury 02/03/19 Coccyx Right;Left;Medial Unstageable - Full thickness tissue loss in which the base of the ulcer is covered by slough (yellow, tan, gray, green or brown) and/or eschar (tan, brown or black) in the wound bed. pt has 3 small injuri (Active)  02/03/19 0230  Location: Coccyx  Location Orientation: Right;Left;Medial  Staging: Unstageable - Full thickness tissue loss in which the base of the ulcer is covered by slough (yellow, tan, gray, green or brown) and/or eschar (tan, brown  or black) in the wound bed.  Wound Description (Comments): pt has 3 small injuries all in the same area, one is medial and two are on either side of it  Present on  Admission: Yes    Discharge Instructions  Allergies as of 03/10/2019      Reactions   Bee Venom Swelling   Swells up with bee stings    Metformin And Related Other (See Comments)   Upset stomach      Medication List    STOP taking these medications   acetaminophen 325 MG tablet Commonly known as: TYLENOL   dextrose 50 % solution   doxycycline 100 MG tablet Commonly known as: VIBRA-TABS   Insulin Glargine 100 UNIT/ML Solostar Pen Commonly known as: LANTUS   polyethylene glycol 17 g packet Commonly known as: MiraLax   senna-docusate 8.6-50 MG tablet Commonly known as: Senokot-S     TAKE these medications   amLODipine 5 MG tablet Commonly known as: NORVASC Take 1 tablet (5 mg total) by mouth daily.   Ampicillin-Sulbactam 3 g in sodium chloride 0.9 % 100 mL Inject 3 g into the vein every 8 (eight) hours.   anidulafungin 100 mg in sodium chloride 0.9 % 100 mL Inject 100 mg into the vein daily.   escitalopram 20 MG tablet Commonly known as: LEXAPRO TAKE ONE TABLET BY MOUTH EVERY DAY   gabapentin 100 MG capsule Commonly known as: NEURONTIN Take 2 capsules (200 mg total) by mouth 2 (two) times daily.   HYDROcodone-acetaminophen 5-325 MG tablet Commonly known as: NORCO/VICODIN Take 1-2 tablets by mouth every 4 (four) hours as needed for moderate pain.   insulin aspart 100 UNIT/ML injection Commonly known as: novoLOG Inject 0-20 Units into the skin every 6 (six) hours. What changed:   how much to take  when to take this   multivitamin with minerals Tabs tablet Take 1 tablet by mouth daily.   pantoprazole 40 MG injection Commonly known as: PROTONIX Inject 40 mg into the vein every 12 (twelve) hours.   sulfamethoxazole-trimethoprim 200 mg in dextrose 5 % 250 mL Inject 200 mg into the vein every 6 (six) hours.      Consultations:  General surgery  Palliative  GI  PCCM  Procedures/Studies:  CT ABDOMEN WO CONTRAST  Result Date:  02/10/2019 CLINICAL DATA:  Eval for perforation. EXAM: CT ABDOMEN WITHOUT CONTRAST TECHNIQUE: Multidetector CT imaging of the abdomen was performed following the standard protocol without IV contrast. COMPARISON:  Upper GI evaluation of the same date and CT abdomen and pelvis of 02/02/2019. FINDINGS: Lower chest: Basilar collapse/consolidation with small bilateral pleural effusions. No signs of pericardial effusion Hepatobiliary: Loculated appearing fluid along the a Paddock margin measures approximately 7 by 2.8 cm scalloping the liver margin. Limited assessment a left hepatic lobe due to extreme streak artifact from recently administered gastrointestinal contrast. Signs of cholelithiasis. Pancreas: Not well evaluated. No gross abnormality within the pancreatic bed. Spleen: Not well evaluated due to streak artifact. Adrenals/Urinary Tract: Normal adrenal glands. No signs of hydronephrosis. Stomach/Bowel: Dense contrast material administered during an upper GI procedure. Irregularity of the antral pyloric stomach compatible likely related to repair of gastric ulcer, surgical drain terminates anterior to the antro pyloric stomach and exits via right sub hepatic abdominal wall. There is a question of very subtle added density within the lumen of the drainage catheter. The density of material surrounding the stomach does not show definitive evidence of a leak however. Mixing of contrast, the small amount of  contrast that did pass the level of the ulcer repair is noted within the lumen of distended small bowel loops becoming more dilute distally. There are numerous distended small bowel loops. Since the previous study there has been resolution of the diffuse enteric pneumatosis. Small amount of fluid is seen along the anterior margin of the abdomen just deep to the abdominal wall, anterior to dilated loops, measuring approximately 8 mm in greatest thickness deep to the left rectus muscle (image 43, series 2.  Vascular/Lymphatic: Calcified atherosclerotic changes throughout the abdominal aorta. No signs of adenopathy. Other: Perihepatic fluid with loculated appearance. Dilated loops of bowel described above, associated with mesenteric edema and a small amount of peritoneal fluid. Musculoskeletal: No signs of acute bone finding or evidence of destructive bone process. IMPRESSION: 1. No signs of gross leak; however, there is a question of very subtle added density within the lumen of the drain, raising the question of a tiny leak that cannot be confirmed by the presence of a collection of extraluminal contrast. Suggest close follow-up perhaps with repeat CT utilizing dilute water-soluble contrast administered via existing gastric tube after resolution of pyloric edema that is seen on today's study. This presumed edema restricts flow of the contrast despite oblique positioning of the patient during the fluoroscopic exam. 2. Interval resolution of the diffuse enteric pneumatosis now with diffuse presumed ileus. Continued attention on follow-up is suggested. 3. Postoperative perihepatic fluid collection with lenticular contour compatible with loculated fluid. 4. Small amount of fluid along the anterior margin of the abdomen just deep to the left rectus muscle, measuring approximately 8 mm in greatest thickness. 5. Basilar collapse/consolidation with small bilateral pleural effusions. 6. These results will be called to the ordering clinician or representative by the Radiologist Assistant, and communication documented in the PACS or zVision Dashboard. Aortic Atherosclerosis (ICD10-I70.0). Electronically Signed   By: Zetta Bills M.D.   On: 02/10/2019 17:31   DG Abd 1 View  Result Date: 02/09/2019 CLINICAL DATA:  NG tube placement EXAM: ABDOMEN - 1 VIEW COMPARISON:  None. FINDINGS: Minimal bibasilar atelectasis. Enteric tube tip and side-port project over the stomach. Gaseous distended loops of small bowel within the  central abdomen. IMPRESSION: Enteric tube tip and side-port project over the stomach. Electronically Signed   By: Lovey Newcomer M.D.   On: 02/09/2019 14:50   CT HEAD WO CONTRAST  Result Date: 02/12/2019 CLINICAL DATA:  Altered mental status EXAM: CT HEAD WITHOUT CONTRAST TECHNIQUE: Contiguous axial images were obtained from the base of the skull through the vertex without intravenous contrast. COMPARISON:  02/02/2019 FINDINGS: Brain: No evidence of acute infarction, hemorrhage, hydrocephalus, extra-axial collection or mass lesion/mass effect. Vascular: No hyperdense vessel or unexpected calcification. Skull: Normal. Negative for fracture or focal lesion. Sinuses/Orbits: No acute finding. Other: None. IMPRESSION: No acute intracranial findings.  Stable exam from 02/02/2019. Electronically Signed   By: Davina Poke M.D.   On: 02/12/2019 14:59   CT ABDOMEN PELVIS W CONTRAST  Result Date: 02/25/2019 CLINICAL DATA:  Abdominal pain, status post gastric perforation repair, evaluate for abscess and the EXAM: CT ABDOMEN AND PELVIS WITH CONTRAST TECHNIQUE: Multidetector CT imaging of the abdomen and pelvis was performed using the standard protocol following bolus administration of intravenous contrast. CONTRAST:  164mL OMNIPAQUE IOHEXOL 300 MG/ML SOLN, additional enteric contrast COMPARISON:  02/18/2019 FINDINGS: Lower chest: Moderate right, small left pleural effusions. Right pleural effusion is enlarged compared to prior examination, left pleural effusion is decreased. Hepatobiliary: No solid liver abnormality is seen.  Gallstone in the contracted gallbladder. Gallbladder wall thickening, unchanged compared to prior examination and nonspecific in the setting of ascites. Pancreas: Unremarkable. No pancreatic ductal dilatation or surrounding inflammatory changes. Spleen: Normal in size without significant abnormality. Adrenals/Urinary Tract: Adrenal glands are unremarkable. Kidneys are normal, without renal calculi,  solid lesion, or hydronephrosis. Bladder is unremarkable. Stomach/Bowel: Diffuse thickening of the small bowel and colon (series 3, image 54). Rectal tubes. Vascular/Lymphatic: Aortic atherosclerosis. Mildly enlarged retroperitoneal lymph nodes. Reproductive: Fluid in the endometrial cavity, unchanged compared to prior examination although abnormal in the postmenopausal setting (series 3, image 64). Other: No abdominal wall hernia. Postoperative findings of midline laparotomy. Surgical drain is positioned in the lesser omentum. Anasarca. There is a persistent fluid collection about the ventral abdomen, which is decreased in size compared to prior examination and partially contrast opacified, suspect persistent leak. This collection measures 14.0 x 3.2 x 14.5 cm, previously 19.6 x 4.2 x 15.2 cm when measured similarly (series 3, image 41, 55, series 7, image 87). Unchanged, loculated fluid collection about the lateral aspect of the right lobe of the liver measuring 7.9 x 4.8 x 1.4 cm (series 3, image 21, series 6, image 45). Interval decrease in size of a loculated appearing fluid collection between the uterus and rectum, measuring 6.1 x 3.1 cm, previously 6.7 x 4.4 cm (series 3, image 66). Interval increase in size of fluid adjacent to the greater curvature of the stomach and spleen in the left upper quadrant, measuring 8.1 x 6.2 cm, previously 6.3 x 3.4 cm (series 3, image 22). Musculoskeletal: Sacral decubitus wound (series 3, image 76). IMPRESSION: 1. There is a persistent fluid collection about the ventral abdomen, which is decreased in size compared to prior examination and partially contrast opacified, suspect persistent leak. This collection measures 14.0 x 3.2 x 14.5 cm, previously 19.6 x 4.2 x 15.2 cm when measured similarly (series 3, image 41, 55, series 7, image 87). 2. Additional fluid collections about the abdomen and pelvis as detailed above, some decreased in size compared to prior examination and  some increased, possibly communicating and fluctuant. 3. Surgical drain positioned in the lesser omentum. 4. Diffuse thickening of the small bowel and colon, likely reactive in the setting of peritonitis. 5. Cholelithiasis with gallbladder wall thickening, unchanged compared to prior examination and nonspecific in the setting of ascites. 6. Moderate right, small left pleural effusions. Right pleural effusion is enlarged compared to prior examination, although left pleural effusion is decreased. 7. Anasarca. 8. Fluid in the endometrial cavity, unchanged compared to prior examination although abnormal in the postmenopausal setting. 9.  Aortic Atherosclerosis (ICD10-I70.0). Electronically Signed   By: Eddie Candle M.D.   On: 02/25/2019 16:30   CT ABDOMEN PELVIS W CONTRAST  Result Date: 02/18/2019 CLINICAL DATA:  Acute abdominal pain. Status post pre pyloric compared gastric ulcer perforation. EXAM: CT ABDOMEN AND PELVIS WITH CONTRAST TECHNIQUE: Multidetector CT imaging of the abdomen and pelvis was performed using the standard protocol following bolus administration of intravenous contrast. CONTRAST:  146mL OMNIPAQUE IOHEXOL 300 MG/ML  SOLN COMPARISON:  CT ABDOMEN 02/10/2019, UPPER GI 02/17/2019 FINDINGS: Lower chest: DENSE BIBASILAR ATELECTASIS WITH AIR BRONCHOGRAMS. SMALL BILATERAL PLEURAL EFFUSIONS. NO PULMONARY EDEMA. Hepatobiliary: Subcapsular fluid collection along the RIGHT hepatic lobe measures 5.5 by 1.3 cm and is smaller than comparison exam. Percutaneous drainage catheter enters the RIGHT abdominal wall adjacent to the RIGHT hepatic lobe. Catheter terminates adjacent the porta hepatis. No fluid collections associated with catheter. The gallbladder is collapsed around gallstones.  No biliary duct dilatation. Pancreas: Pancreas is normal. No ductal dilatation. No pancreatic inflammation. Spleen: Normal spleen Adrenals/urinary tract: Adrenal glands are normal. Kidneys enhance symmetrically. There is contrast  within mildly distended bladder. Small a gas bladder. Stomach/Bowel: NG tube extends the stomach. There is a large volume of high-density fluid within the ventral peritoneal space which appears associated with abnormal loops of small bowel. There is mild pneumatosis with the loops of small bowel (a image 47/3). The walls of the bowel are not well identified. This high-density collection which is favored to be within the loops small bowel suggest flaccid bowel with limited peristalsis resulting in a matted collection of contrast. Contrast does flow into the distal small bowel bowel and colon. Vascular/Lymphatic: Abdominal aorta is normal caliber. No periportal or retroperitoneal adenopathy. No pelvic adenopathy. Reproductive: Endometrium is mildly expanded to 8 mm Other: There several pockets of intraperitoneal free fluid with enhancing rim. 1 pocket is posterior to the uterus measuring 5.4 x 3.5 cm. Similar smaller pocket in the peritoneal space ventrally on the LEFT measures 1.5 x 2.5 cm sagittal image 90/126. Musculoskeletal: No aggressive osseous lesion. IMPRESSION: 1. Stasis of oral contrast within the mid small bowel which floats in the central abdomen. The loops of bowel are difficult to discern from each and there is pneumatosis within several loops of bowel. Findings concerning for flaccid bowel/ischemic bowel. Recommend clinical correlation and surgical consultation. 2. The above contrast collection in the ventral abdomen is not favored to be related to gastric perforation as there is no high-density intraperitoneal fluid elsewhere and no significant intraperitoneal free air. 3. Several pockets of fluid within the peritoneal space with thin enhancing rims consistent with peritonitis. 4. Reduction in subcapsular fluid collection along the RIGHT hepatic lobe which may represent a biloma. 5. Dense bibasilar atelectasis. 1.  Findings conveyed toKELLY OSBORNE on 02/18/2019  at17:24. Electronically Signed   By:  Suzy Bouchard M.D.   On: 02/18/2019 17:30   DG Chest Port 1 View  Result Date: 03/04/2019 CLINICAL DATA:  Right-sided thoracentesis. EXAM: PORTABLE CHEST 1 VIEW COMPARISON:  CTA of the abdomen and pelvis 03/03/2019 FINDINGS: The heart size is normal. Previously noted right pleural effusion significantly reduced. There is no pneumothorax. Heart size is normal. Small left effusion remains. Right-sided PICC line is in place. NG tube terminates in the stomach. IMPRESSION: 1. Marked decrease in right pleural effusion. 2. Small left pleural effusion. 3. No pneumothorax. 4. The support apparatus is stable. Electronically Signed   By: San Morelle M.D.   On: 03/04/2019 13:40   DG CHEST PORT 1 VIEW  Result Date: 02/17/2019 CLINICAL DATA:  Acute respiratory distress, hypertension, diabetes mellitus, post breathing treatment EXAM: PORTABLE CHEST 1 VIEW COMPARISON:  Portable exam 1648 hours compared to 02/06/2019 FINDINGS: RIGHT arm PICC line tip projects over confluence of SVC. Nasogastric tube extends into stomach. Retained contrast in stomach and proximal jejunum. Normal heart size, mediastinal contours, and pulmonary vascularity. Bibasilar atelectasis versus infiltrate, greater on LEFT. Upper lungs clear. No pleural effusion or pneumothorax. Osseous structures unremarkable. IMPRESSION: Bibasilar atelectasis versus infiltrate greater on LEFT. Electronically Signed   By: Lavonia Dana M.D.   On: 02/17/2019 16:57   DG Loyce Dys Tube Plc W/Fl W/Rad  Result Date: 02/17/2019 CLINICAL DATA:  Nasogastric tube placement. Recent gastric ulcer repair. Evaluate for leak. EXAM: NASO G TUBE PLACEMENT WITH FL AND WITH RAD; DG UGI W SINGLE CM CONTRAST:  124mL OMNIPAQUE IOHEXOL 300 MG/ML  SOLN FLUOROSCOPY TIME:  Fluoroscopy  Time:  1 minutes 6 seconds Radiation Exposure Index (if provided by the fluoroscopic device): 11 mGy Number of Acquired Spot Images: 2 COMPARISON:  Upper GI 02/10/2019 and CT abdomen pelvis 02/10/2019.  FINDINGS: Nasogastric tube was placed under fluoroscopic guidance with the tip positioned in the gastric cardia. Subsequently, 180 cc Omnipaque 300 was hand injected. No leak. Attempts were made to rotate the patient to help move contrast from the stomach into the proximal small bowel but, due to pain, she was not able to do so. No leak within the opacified portions of the stomach. IMPRESSION: 1. Successful nasogastric tube placement under fluoroscopy. 2. Limited exam of the upper gastrointestinal tract without a leak in the opacified portions of the stomach. Please see discussion above. Electronically Signed   By: Lorin Picket M.D.   On: 02/17/2019 14:49   DG UGI W SINGLE CM (SOL OR THIN BA)  Result Date: 02/17/2019 CLINICAL DATA:  Nasogastric tube placement. Recent gastric ulcer repair. Evaluate for leak. EXAM: NASO G TUBE PLACEMENT WITH FL AND WITH RAD; DG UGI W SINGLE CM CONTRAST:  18mL OMNIPAQUE IOHEXOL 300 MG/ML  SOLN FLUOROSCOPY TIME:  Fluoroscopy Time:  1 minutes 6 seconds Radiation Exposure Index (if provided by the fluoroscopic device): 11 mGy Number of Acquired Spot Images: 2 COMPARISON:  Upper GI 02/10/2019 and CT abdomen pelvis 02/10/2019. FINDINGS: Nasogastric tube was placed under fluoroscopic guidance with the tip positioned in the gastric cardia. Subsequently, 180 cc Omnipaque 300 was hand injected. No leak. Attempts were made to rotate the patient to help move contrast from the stomach into the proximal small bowel but, due to pain, she was not able to do so. No leak within the opacified portions of the stomach. IMPRESSION: 1. Successful nasogastric tube placement under fluoroscopy. 2. Limited exam of the upper gastrointestinal tract without a leak in the opacified portions of the stomach. Please see discussion above. Electronically Signed   By: Lorin Picket M.D.   On: 02/17/2019 14:49   DG UGI W SINGLE CM (SOL OR THIN BA)  Result Date: 02/10/2019 CLINICAL DATA:  History of  perforated gastric ulcer. EXAM: WATER SOLUBLE UPPER GI SERIES TECHNIQUE: Single-column upper GI series was performed using water soluble contrast. CONTRAST:  100 cc Omnipaque administered per existing G-tube period. COMPARISON:  CT abdomen and pelvis from 02/02/2019. FLUOROSCOPY TIME:  Fluoroscopy Time:  2 minutes 18 seconds Radiation Exposure Index (if provided by the fluoroscopic device): 20.4 mGy Number of Acquired Spot Images: 4 FINDINGS: Scout image obtained shows a G-tube coiled in the stomach and a surgical drain entering the image from the right likely terminating in the area around the repair site. Bowel distension in the upper abdomen is noted, small bowel loops reaching caliber near the 2-1/2 to 3 cm. Slow injection of iodinated contrast material pooled in the stomach, confined mainly to the fundus and body of stomach despite attempts at oblique positioning. This was complicated by the patient's debilitated state and recent surgery. Limited contrast passed through the pyloric channel. Irregularity of this channel likely relates to the repair and 2 significant edema in the area. IMPRESSION: 1. Presumed bowel edema following repair, limiting flow of contrast from the stomach into the small bowel, for this reason the exam the subsequent CT or limited with respect to detection of small leaks. There is no gross leak on the images which were obtained for today's study. 2. The patient was taken to CT following this procedure in an attempt to localize smaller amounts  of I had and 80 contrast which might be present but not detectable by fluoroscopy. Please see dedicated report for further detail. 3. Signs of presumed ileus.  Suggest attention on follow-up. Electronically Signed   By: Zetta Bills M.D.   On: 02/10/2019 17:36   ECHOCARDIOGRAM COMPLETE  Result Date: 03/04/2019   ECHOCARDIOGRAM REPORT   Patient Name:   Gina [name] Date of Exam: 03/04/2019 Medical Rec #:  [id number]       Height:       62.0  in Accession #:    [id number]      Weight:       117.5 lb Date of Birth:  Jul 17, 1960        BSA:          1.52 m Patient Age:    72 years        BP:           119/57 mmHg Patient Gender: F               HR:           85 bpm. Exam Location:  Inpatient Procedure: 2D Echo, Cardiac Doppler and Color Doppler Indications:    Congestive Heart Failure 428.0  History:        Patient has no prior history of Echocardiogram examinations.                 Risk Factors:Hypertension, Diabetes and Non-Smoker. Septic                 shock.  Sonographer:    Paulita Fujita RDCS Referring Phys: [id number] AMRIT ADHIKARI IMPRESSIONS  1. Left ventricular ejection fraction, by visual estimation, is 60 to 65%. The left ventricle has normal function. There is no left ventricular hypertrophy.  2. The left ventricle demonstrates regional wall motion abnormalities.  3. Global right ventricle has normal systolic function.The right ventricular size is normal. No increase in right ventricular wall thickness.  4. Left atrial size was normal.  5. Right atrial size was normal.  6. The mitral valve is normal in structure. No evidence of mitral valve regurgitation. No evidence of mitral stenosis.  7. The tricuspid valve is normal in structure. Tricuspid valve regurgitation is not demonstrated.  8. The aortic valve is normal in structure. Aortic valve regurgitation is not visualized. No evidence of aortic valve sclerosis or stenosis.  9. The pulmonic valve was normal in structure. Pulmonic valve regurgitation is not visualized. 10. TR signal is inadequate for assessing pulmonary artery systolic pressure. 11. The inferior vena cava is normal in size with greater than 50% respiratory variability, suggesting right atrial pressure of 3 mmHg. FINDINGS  Left Ventricle: Left ventricular ejection fraction, by visual estimation, is 60 to 65%. The left ventricle has normal function. The left ventricle demonstrates regional wall motion abnormalities. The left  ventricular internal cavity size was the left ventricle is normal in size. There is no left ventricular hypertrophy. Left ventricular diastolic parameters were normal. Normal left atrial pressure. Right Ventricle: The right ventricular size is normal. No increase in right ventricular wall thickness. Global RV systolic function is has normal systolic function. Left Atrium: Left atrial size was normal in size. Right Atrium: Right atrial size was normal in size Pericardium: There is no evidence of pericardial effusion. Mitral Valve: The mitral valve is normal in structure. No evidence of mitral valve regurgitation. No evidence of mitral valve stenosis by observation. Tricuspid Valve: The tricuspid valve is normal in structure.  Tricuspid valve regurgitation is not demonstrated. Aortic Valve: The aortic valve is normal in structure. Aortic valve regurgitation is not visualized. The aortic valve is structurally normal, with no evidence of sclerosis or stenosis. Pulmonic Valve: The pulmonic valve was normal in structure. Pulmonic valve regurgitation is not visualized. Pulmonic regurgitation is not visualized. Aorta: The aortic root, ascending aorta and aortic arch are all structurally normal, with no evidence of dilitation or obstruction. Venous: The inferior vena cava is normal in size with greater than 50% respiratory variability, suggesting right atrial pressure of 3 mmHg. IAS/Shunts: No atrial level shunt detected by color flow Doppler. There is no evidence of a patent foramen ovale. No ventricular septal defect is seen or detected. There is no evidence of an atrial septal defect.  LEFT VENTRICLE PLAX 2D LVIDd:         4.40 cm  Diastology LVIDs:         3.20 cm  LV e' lateral:   11.50 cm/s LV PW:         0.80 cm  LV E/e' lateral: 6.6 LV IVS:        0.80 cm  LV e' medial:    7.94 cm/s LVOT diam:     1.70 cm  LV E/e' medial:  9.6 LV SV:         47 ml LV SV Index:   30.55 LVOT Area:     2.27 cm  RIGHT VENTRICLE RV S  prime:     12.30 cm/s TAPSE (M-mode): 2.0 cm LEFT ATRIUM             Index       RIGHT ATRIUM          Index LA diam:        2.70 cm 1.77 cm/m  RA Area:     9.64 cm LA Vol (A2C):   22.4 ml 14.69 ml/m RA Volume:   16.40 ml 10.75 ml/m LA Vol (A4C):   20.3 ml 13.31 ml/m LA Biplane Vol: 21.6 ml 14.16 ml/m  AORTIC VALVE LVOT Vmax:   118.00 cm/s LVOT Vmean:  80.800 cm/s LVOT VTI:    0.208 m  AORTA Ao Root diam: 2.70 cm MITRAL VALVE MV Area (PHT): 3.93 cm             SHUNTS MV PHT:        55.97 msec           Systemic VTI:  0.21 m MV Decel Time: 193 msec             Systemic Diam: 1.70 cm MV E velocity: 76.30 cm/s 103 cm/s MV A velocity: 93.00 cm/s 70.3 cm/s MV E/A ratio:  0.82       1.5  Mihai Croitoru MD Electronically signed by Sanda Klein MD Signature Date/Time: 03/04/2019/4:31:24 PM    Final    CT IMAGE GUIDED DRAINAGE BY PERCUTANEOUS CATHETER  Result Date: 02/26/2019 CLINICAL DATA:  Duodenal ulcer, post surgical repair. Loculated anterior peritoneal fluid collection. EXAM: CT GUIDED DRAINAGE OF PERITONEAL ABSCESS ANESTHESIA/SEDATION: Intravenous Fentanyl 24mcg and Versed 1mg  were administered as conscious sedation during continuous monitoring of the patient's level of consciousness and physiological / cardiorespiratory status by the radiology RN, with a total moderate sedation time of 18 minutes. PROCEDURE: The procedure, risks, benefits, and alternatives were explained to the patient. Questions regarding the procedure were encouraged and answered. The patient understands and consents to the procedure. Limited axial scans through the abdomen were obtained. The  collection was localized and appropriate skin entry site was determined and marked. The operative field was prepped with chlorhexidinein a sterile fashion, and a sterile drape was applied covering the operative field. A sterile gown and sterile gloves were used for the procedure. Local anesthesia was provided with 1% Lidocaine. Under CT  fluoroscopic guidance, a 19 gauge percutaneous entry needle advanced into the anterior peritoneal collection. Brownish viscous opaque fluid could be aspirated. Amplatz guidewire advanced with within the collection, position confirmed on CT. Tract dilated to facilitate placement of a 12 French pigtail drain catheter, formed centrally within the collection. A sample of the aspirate was sent for Gram stain and culture. Catheter secured externally with 0 Prolene suture and StatLock and placed to external gravity drainage. The patient tolerated the procedure well. COMPLICATIONS: None immediate FINDINGS: Anterior loculated gas and fluid collection was localized. 12 French pigtail drain catheter placed within the dependent aspect of the collection as above. IMPRESSION: 1. Technically successful CT-guided peritoneal abscess drain catheter placement. Electronically Signed   By: Lucrezia Europe M.D.   On: 02/26/2019 16:18   [place] EKG SITE RITE  Result Date: 02/13/2019 If Site Rite image not attached, placement could not be confirmed due to current cardiac rhythm.  CT Angio Abd/Pel w/ and/or w/o  Result Date: 03/03/2019 CLINICAL DATA:  Duodenal ulcer, post surgical repair with postop anterior peritoneal fluid collection, post pigtail drain catheter placement 02/26/2019. New hypotension and tachycardia. EXAM: CTA ABDOMEN AND PELVIS WITHOUT AND WITH CONTRAST TECHNIQUE: Multidetector CT imaging of the abdomen and pelvis was performed using the standard protocol during bolus administration of intravenous contrast. Multiplanar reconstructed images and MIPs were obtained and reviewed to evaluate the vascular anatomy. CONTRAST:  176mL OMNIPAQUE IOHEXOL 350 MG/ML SOLN COMPARISON:  02/25/2019 FINDINGS: VASCULAR Aorta: Mild scattered calcified plaque. No aneurysm, dissection, or stenosis. Celiac: Patent without evidence of aneurysm, dissection, vasculitis or significant stenosis. Left hepatic artery arises from left gastric artery, an  anatomic variant. SMA: Patent without evidence of aneurysm, dissection, vasculitis or significant stenosis. Renals: Single left, widely patent. Duplicated right, superior dominant, both patent. IMA: Patent without evidence of aneurysm, dissection, vasculitis or significant stenosis. Inflow: Patent without evidence of aneurysm, dissection, vasculitis or significant stenosis. Proximal Outflow: Bilateral common femoral and visualized portions of the superficial and profunda femoral arteries are patent without evidence of aneurysm, dissection, vasculitis or significant stenosis. Veins: Iliac venous system unremarkable. IVC is nondistended. Patent bilateral renal veins. Patent SMV, splenic vein, portal vein. Hepatic veins patent. Review of the MIP images confirms the above findings. NON-VASCULAR Lower chest: Some increase in moderate right pleural effusion. Extensive consolidation/atelectasis in the visualized right lower lobe. Patchy subsegmental atelectasis/consolidation/scarring posteriorly at the left lung base. Hepatobiliary: 1.4 cm partially calcified stone in the nondilated gallbladder. No focal liver lesion or biliary ductal dilatation. Pancreas: Unremarkable. No pancreatic ductal dilatation or surrounding inflammatory changes. Spleen: Normal in size without focal abnormality. Adrenals/Urinary Tract: Adrenal glands unremarkable. Normal bilateral renal enhancement. No hydronephrosis. Urinary bladder is distended. Stomach/Bowel: No evidence of active extravasation. Nasogastric tube extends into decompressed stomach. There is fluid distention of the proximal duodenum. Multiple fluid distended small bowel loops. There is a small amount of fluid in the cecum and proximal ascending colon, the remainder of the colon virtually completely decompressed. Lymphatic: Left para-aortic nodes up to 0.9 cm short axis diameter. Prominent 8 mm distal right external iliac node. No definite mesenteric adenopathy. Reproductive: Uterus  and bilateral adnexa are unremarkable. Other: Little overall change in simple appearing  fluid collection lateral and inferior to the greater curvature of the stomach. Right infrahepatic surgical drain without significant adjacent fluid. Complex loculated peripherally enhancing collection in the anterior peritoneal cavity has increased in size now measuring 15.8 x 3.9 cm maximum transverse dimensions, previously 11.7 x 1.7 cm by my measurement. This contains mixed attenuation material and some scattered gas bubbles. Percutaneous drain catheter appears adequately positioned at the inferior margin of this collection. Small loculated fluid collection posterior to the uterus is stable. No definite new fluid collections. No free air. Musculoskeletal: No fracture or worrisome bone lesion. Bilateral sacroiliitis. IMPRESSION: 1. No active GI bleed or other acute vascular findings. 2. Interval increase in size of large complex anterior peritoneal fluid collection, despite percutaneous drain catheter at the inferior margin. 3. Persistent fluid distended small bowel loops, possibly secondary to ileus. 4. Increasing moderate right pleural effusion with extensive right lower lobe consolidation/atelectasis. 5. Cholelithiasis. 6. Bilateral sacroiliitis. Electronically Signed   By: Lucrezia Europe M.D.   On: 03/03/2019 17:00   US THORACENTESIS ASP PLEURAL SPACE W/IMG GUIDE  Result Date: 03/04/2019 INDICATION: Patient with history of septic shock due to ruptured gastric ulcer with peritonitis, abdominal abscess, right pleural effusion; request received for diagnostic and therapeutic right thoracentesis. EXAM: ULTRASOUND GUIDED DIAGNOSTIC AND THERAPEUTIC RIGHT THORACENTESIS MEDICATIONS: None COMPLICATIONS: None immediate. PROCEDURE: An ultrasound guided thoracentesis was thoroughly discussed with the patient's daughter and questions answered. The benefits, risks, alternatives and complications were also discussed. The patient  understands and wishes to proceed with the procedure. Written consent was obtained. Ultrasound was performed to localize and mark an adequate pocket of fluid in the right chest. The area was then prepped and draped in the normal sterile fashion. 1% Lidocaine was used for local anesthesia. Under ultrasound guidance a 6 Fr Safe-T-Centesis catheter was introduced. Thoracentesis was performed. The catheter was removed and a dressing applied. FINDINGS: A total of approximately 710 cc of yellow fluid was removed. Samples were sent to the laboratory as requested by the clinical team. IMPRESSION: Successful ultrasound guided diagnostic and therapeutic right thoracentesis yielding 710 cc of pleural fluid. Read by: Rowe Robert, PA-C Electronically Signed   By: Jacqulynn Cadet M.D.   On: 03/04/2019 12:43      Subjective: - no chest pain, shortness of breath, no abdominal pain, nausea or vomiting.   Discharge Exam: BP 110/75    Pulse 78    Temp 98 F (36.7 C) (Oral)    Resp 18    Ht 5\' 2"  (1.575 m)    Wt 53.3 kg    LMP 03/29/2015    SpO2 100%    BMI 21.49 kg/m   General: Pt is alert, awake, not in acute distress Cardiovascular: RRR, S1/S2 +, no rubs, no gallops Respiratory: CTA bilaterally, no wheezing, no rhonchi   The results of significant diagnostics from this hospitalization (including imaging, microbiology, ancillary and laboratory) are listed below for reference.     Microbiology: Recent Results (from the past 240 hour(s))  Body fluid culture     Status: None   Collection Time: 03/04/19 12:54 PM   Specimen: Lung, Right; Pleural Fluid  Result Value Ref Range Status   Specimen Description   Final    PLEURAL RIGHT Performed at [facility] [address], [place], [place] [address]    Special Requests   Final    NONE Performed at [facility], [place] [address], [place], [place] [address]    Gram Stain  Final    RARE WBC PRESENT,BOTH PMN AND  MONONUCLEAR NO ORGANISMS SEEN    Culture   Final    NO GROWTH 3 DAYS Performed at [facility], [place] [address], [place], [place] [address]    Report Status 03/07/2019 FINAL  Final     Labs: Basic Metabolic Panel: Recent Labs  Lab 03/06/19 1019 03/06/19 1138 03/07/19 0218 03/08/19 0212 03/09/19 0222 03/10/19 0241  NA  --  134* 133* 132* 130* 133*  K  --  5.2* 4.6 4.1 3.7 3.4*  CL  --  104 103 102 105 107  CO2  --  24 20* 19* 18* 18*  GLUCOSE  --  108* 97 76 134* 103*  BUN  --  46* 42* 49* 46* 47*  CREATININE  --  0.74 0.72 0.73 0.69 0.67  CALCIUM  --  8.2* 8.6* 8.7* 8.3* 8.5*  MG SPECIMEN CONTAMINATED, UNABLE TO PERFORM TEST(S). 2.2  --   --   --  2.2  PHOS SPECIMEN CONTAMINATED, UNABLE TO PERFORM TEST(S). 3.6  --   --   --  3.7   Liver Function Tests: Recent Labs  Lab 03/06/19 1138  AST 38  ALT 38  ALKPHOS 293*  BILITOT 0.3  PROT 6.6  ALBUMIN 1.4*   CBC: Recent Labs  Lab 03/05/19 0207 03/05/19 1713 03/06/19 1019 03/06/19 1138 03/07/19 0218 03/10/19 0241  WBC 13.2* 9.9 SPECIMEN CONTAMINATED, UNABLE TO PERFORM TEST(S). 10.6* 9.8 11.5*  NEUTROABS 9.9*  --  SPECIMEN CONTAMINATED, UNABLE TO PERFORM TEST(S). 6.8 6.0 7.3  HGB 6.2* 9.0* SPECIMEN CONTAMINATED, UNABLE TO PERFORM TEST(S). 9.5* 9.8* 9.0*  HCT 20.8* 27.6* SPECIMEN CONTAMINATED, UNABLE TO PERFORM TEST(S). 29.9* 31.5* 29.5*  MCV 96.3 94.5 SPECIMEN CONTAMINATED, UNABLE TO PERFORM TEST(S). 92.0 93.8 93.9  PLT 450* 380 SPECIMEN CONTAMINATED, UNABLE TO PERFORM TEST(S). 382 367 424*   CBG: Recent Labs  Lab 03/08/19 2357 03/09/19 0610 03/09/19 1155 03/09/19 1813 03/09/19 2317  GLUCAP 124* 115* 171* 99 157*   Hgb A1c No results for input(s): HGBA1C in the last 72 hours. Lipid Profile Recent Labs    03/10/19 0241  TRIG 117   Thyroid function studies No results for input(s): TSH, T4TOTAL, T3FREE, THYROIDAB in the last 72 hours.  Invalid input(s): FREET3 Urinalysis    Component Value  Date/Time   COLORURINE YELLOW 02/12/2019 1401   APPEARANCEUR CLOUDY (A) 02/12/2019 1401   LABSPEC 1.010 02/12/2019 1401   PHURINE 5.0 02/12/2019 1401   GLUCOSEU >=500 (A) 02/12/2019 1401   HGBUR MODERATE (A) 02/12/2019 1401   BILIRUBINUR NEGATIVE 02/12/2019 1401   BILIRUBINUR N 04/28/2016 1603   KETONESUR NEGATIVE 02/12/2019 1401   PROTEINUR 30 (A) 02/12/2019 1401   UROBILINOGEN 0.2 04/28/2016 1603   UROBILINOGEN 0.2 06/18/2013 0650   NITRITE NEGATIVE 02/12/2019 1401   LEUKOCYTESUR NEGATIVE 02/12/2019 1401    FURTHER DISCHARGE INSTRUCTIONS:   Get Medicines reviewed and adjusted: Please take all your medications with you for your next visit with your Primary MD   Laboratory/radiological data: Please request your Primary MD to go over all hospital tests and procedure/radiological results at the follow up, please ask your Primary MD to get all Hospital records sent to his/her office.   In some cases, they will be blood work, cultures and biopsy results pending at the time of your discharge. Please request that your primary care M.D. goes through all the records of your hospital data and follows up on these results.   Also Note the  following: If you experience worsening of your admission symptoms, develop shortness of breath, life threatening emergency, suicidal or homicidal thoughts you must seek medical attention immediately by calling 911 or calling your MD immediately  if symptoms less severe.   You must read complete instructions/literature along with all the possible adverse reactions/side effects for all the Medicines you take and that have been prescribed to you. Take any new Medicines after you have completely understood and accpet all the possible adverse reactions/side effects.    Do not drive when taking Pain medications or sleeping medications (Benzodaizepines)   Do not take more than prescribed Pain, Sleep and Anxiety Medications. It is not advisable to combine  anxiety,sleep and pain medications without talking with your primary care practitioner   Special Instructions: If you have smoked or chewed Tobacco  in the last 2 yrs please stop smoking, stop any regular Alcohol  and or any Recreational drug use.   Wear Seat belts while driving.   Please note: You were cared for by a hospitalist during your hospital stay. Once you are discharged, your primary care physician will handle any further medical issues. Please note that NO REFILLS for any discharge medications will be authorized once you are discharged, as it is imperative that you return to your primary care physician (or establish a relationship with a primary care physician if you do not have one) for your post hospital discharge needs so that they can reassess your need for medications and monitor your lab values.  Time coordinating discharge: 40 minutes  SIGNED:  Marzetta Board, MD, PhD 03/10/2019, 10:34 AM

## 2019-03-11 ENCOUNTER — Other Ambulatory Visit (HOSPITAL_COMMUNITY): Payer: Self-pay

## 2019-03-11 LAB — URINALYSIS, ROUTINE W REFLEX MICROSCOPIC
Bacteria, UA: NONE SEEN
Bilirubin Urine: NEGATIVE
Glucose, UA: 50 mg/dL — AB
Hgb urine dipstick: NEGATIVE
Ketones, ur: NEGATIVE mg/dL
Leukocytes,Ua: NEGATIVE
Nitrite: NEGATIVE
Protein, ur: 30 mg/dL — AB
Specific Gravity, Urine: 1.018 (ref 1.005–1.030)
pH: 6 (ref 5.0–8.0)

## 2019-03-11 LAB — COMPREHENSIVE METABOLIC PANEL
ALT: 115 U/L — ABNORMAL HIGH (ref 0–44)
AST: 87 U/L — ABNORMAL HIGH (ref 15–41)
Albumin: 1.3 g/dL — ABNORMAL LOW (ref 3.5–5.0)
Alkaline Phosphatase: 577 U/L — ABNORMAL HIGH (ref 38–126)
Anion gap: 10 (ref 5–15)
BUN: 40 mg/dL — ABNORMAL HIGH (ref 6–20)
CO2: 16 mmol/L — ABNORMAL LOW (ref 22–32)
Calcium: 8.6 mg/dL — ABNORMAL LOW (ref 8.9–10.3)
Chloride: 104 mmol/L (ref 98–111)
Creatinine, Ser: 0.75 mg/dL (ref 0.44–1.00)
GFR calc Af Amer: >60 mL/min (ref >60)
GFR calc non Af Amer: >60 mL/min (ref >60)
Glucose, Bld: 260 mg/dL — ABNORMAL HIGH (ref 70–99)
Potassium: 4.2 mmol/L (ref 3.5–5.1)
Sodium: 130 mmol/L — ABNORMAL LOW (ref 135–145)
Total Bilirubin: 0.2 mg/dL — ABNORMAL LOW (ref 0.3–1.2)
Total Protein: 6.6 g/dL (ref 6.5–8.1)

## 2019-03-11 LAB — CBC
HCT: 30 % — ABNORMAL LOW (ref 36.0–46.0)
Hemoglobin: 9.6 g/dL — ABNORMAL LOW (ref 12.0–15.0)
MCH: 28.9 pg (ref 26.0–34.0)
MCHC: 32 g/dL (ref 30.0–36.0)
MCV: 90.4 fL (ref 80.0–100.0)
Platelets: 446 10*3/uL — ABNORMAL HIGH (ref 150–400)
RBC: 3.32 MIL/uL — ABNORMAL LOW (ref 3.87–5.11)
RDW: 14.6 % (ref 11.5–15.5)
WBC: 12.2 10*3/uL — ABNORMAL HIGH (ref 4.0–10.5)
nRBC: 0 % (ref 0.0–0.2)

## 2019-03-11 LAB — TSH: TSH: 2.668 u[IU]/mL (ref 0.350–4.500)

## 2019-03-12 LAB — COMPREHENSIVE METABOLIC PANEL
ALT: 107 U/L — ABNORMAL HIGH (ref 0–44)
AST: 68 U/L — ABNORMAL HIGH (ref 15–41)
Albumin: 1.2 g/dL — ABNORMAL LOW (ref 3.5–5.0)
Alkaline Phosphatase: 479 U/L — ABNORMAL HIGH (ref 38–126)
Anion gap: 8 (ref 5–15)
BUN: 27 mg/dL — ABNORMAL HIGH (ref 6–20)
CO2: 17 mmol/L — ABNORMAL LOW (ref 22–32)
Calcium: 8.6 mg/dL — ABNORMAL LOW (ref 8.9–10.3)
Chloride: 102 mmol/L (ref 98–111)
Creatinine, Ser: 0.69 mg/dL (ref 0.44–1.00)
GFR calc Af Amer: >60 mL/min (ref >60)
GFR calc non Af Amer: >60 mL/min (ref >60)
Glucose, Bld: 438 mg/dL — ABNORMAL HIGH (ref 70–99)
Potassium: 4.8 mmol/L (ref 3.5–5.1)
Sodium: 127 mmol/L — ABNORMAL LOW (ref 135–145)
Total Bilirubin: 0.3 mg/dL (ref 0.3–1.2)
Total Protein: 6.3 g/dL — ABNORMAL LOW (ref 6.5–8.1)

## 2019-03-12 LAB — MAGNESIUM: Magnesium: 1.4 mg/dL — ABNORMAL LOW (ref 1.7–2.4)

## 2019-03-12 LAB — HEMOGLOBIN A1C
Hgb A1c MFr Bld: 7 % — ABNORMAL HIGH (ref 4.8–5.6)
Mean Plasma Glucose: 154.2 mg/dL

## 2019-03-12 LAB — PHOSPHORUS: Phosphorus: 3.7 mg/dL (ref 2.5–4.6)

## 2019-03-12 NOTE — Consult Note (Signed)
Infectious Disease Consultation   Gina [name]  [id number]  DOB: Sep 16, 1960  DOA: 03/10/2019  Requesting physician: Dr.Hijazi  Reason for consultation: Antibiotic recommendations   History of Present Illness: Gina [name] is an 58 y.o. 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 She had "flu-like symptoms" for the past few days. She lives alone but has a caretaker who lives nearby. She was found on the floor very altered. In the ED, she was hypothermic and severely hypotensive. Initial blood glucose >1000. 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 The scan also noted cholelithiasis without cholecystitis and bibasilar airspace disease. CT head no acute abnormality. CXR wnl. While in the ED, she was given 6L IVF and started on low-dose Levophed through a peripheral IV. She was also started on insulin infusion per DKA protocol. She received vancomycin, cefepime, metronidazole and fluconazole.  She was admitted with septic shock.  Surgical consult was called.She was initially intubated. She has been on broad-spectrum antibiotics, pressors, fluids, and general surgery took her for an ex lap on 11/23 with findings of perforated prepyloric ulcer which was repaired, she was also found to have contaminated peritoneal fluid which was drained. She was extubated 11/29. Her hospital course complicated by DKA, acute kidney injury, acute blood loss anemia, thrombocytopenia, intra-abdominal infection as well as bacteremia and fungemia. Blood cultures showed Candida glabrata, urine culture showed Klebsiella and peritoneal fluid showed Candida  albicans. Ophthalmology was also consulted and there is no evidence of fungal endophthalmitis. CT of the abdomen done on 12/15 showed persistent abdominal collection/leak. IR placed left lower quadrant anterior peritoneal drain on 12/16. On 12/21, she has had multiple bloody bowel movements including bloody output from JP drain. CT angiogram did not show any active bleeding but showed persistent large complex anterior peritoneal fluid collection. GI was consulted and evaluated patient. Since 12/24 hemodynamically stable, mental status improved. Due to poor prognosis palliative was consulted and discussions were held regarding goals of care with family reluctant.ID was consulted for her infections and recommended current antibiotic regimen to continue till January 4 and then be monitored off antibiotics. She is also on TPN.  She is currently on treatment with Unasyn, Bactrim, anidulafungin. She also has a right lower quadrant JP drain with suction and midline surgical wounds along with an NG tube. Getting TPN.  She is complaining of some abdominal discomfort and some pain in her lower extremity BKA site.   Review of Systems:  Review of systems negative except as mentioned above in the HPI.   Past Medical History: Past Medical History:  Diagnosis Date  . Diabetes mellitus   . Hypertension   . Osteomyelitis of right foot ([place]) 02/22/2017  . Schizophrenia ([place])   . Septic arthritis of interphalangeal joint of toe of left foot ([place]) 06/02/2016  . Stress incontinence 04/26/2017    Past Surgical History: Past Surgical History:  Procedure Laterality Date  . AMPUTATION Left 06/05/2016   Procedure: LEFT FOOT TRANSMETATARSAL AMPUTATION;  Surgeon: [name], MD;  Location: [facility];  Service: Orthopedics;  Laterality: Left;  . AMPUTATION Right 11/11/2017   Procedure: AMPUTATION BELOW KNEE;  Surgeon: [name], MD;  Location: [place];  Service: Orthopedics;  Laterality: Right;  .  AMPUTATION  Left 05/10/2018   Procedure: LEFT BELOW KNEE AMPUTATION;  Surgeon: [name], MD;  Location: [place];  Service: Orthopedics;  Laterality: Left;  . AMPUTATION Left 12/28/2018   Procedure: AMPUTATION THIRD DIGIT;  Surgeon: Iran Planas, MD;  Location: [facility];  Service: Orthopedics;  Laterality: Left;  . AMPUTATION TOE Right 02/23/2017   Procedure: AMPUTATION RIGHT THIRD TOE;  Surgeon: [name], MD;  Location: [place];  Service: Orthopedics;  Laterality: Right;  . I & D EXTREMITY Right 11/09/2017   Procedure: Debride Ulcer Right Heel;  Surgeon: [name], MD;  Location: [place];  Service: Orthopedics;  Laterality: Right;  . I & D EXTREMITY Left 12/28/2018   Procedure: IRRIGATION AND DEBRIDEMENT EXTREMITY;  Surgeon: Iran Planas, MD;  Location: [facility];  Service: Orthopedics;  Laterality: Left;  . INCISION AND DRAINAGE OF WOUND Left 12/26/2018   Procedure: IRRIGATION AND DEBRIDEMENT LEFT HAND;  Surgeon: Iran Planas, MD;  Location: [facility];  Service: Orthopedics;  Laterality: Left;  . LAPAROTOMY N/A 02/02/2019   Procedure: EXPLORATORY LAPAROTOMY WITH OVERSEW OF DUODENAL ULCER;  Surgeon: Alphonsa Overall, MD;  Location: [facility];  Service: General;  Laterality: N/A;  . TUBAL LIGATION       Allergies:   Allergies  Allergen Reactions  . Bee Venom Swelling    Swells up with bee stings   . Metformin And Related Other (See Comments)    Upset stomach     Social History:  reports that she has never smoked. She has never used smokeless tobacco. She reports that she does not drink alcohol or use drugs.   Family History: Family History  Problem Relation Age of Onset  . Diabetes type II Father   . CAD Father   . Prostate cancer Father   . Diabetes Mellitus II Brother     Physical Exam: Temperature 98.4, pulse 96, respiratory 15, blood pressure 138/75, pulse oximetry 97% General: Chronically ill-appearing female, awake and oriented, does not appear to be in any acute distress at this  time Eyes: PERLA, EOMI, irises appear normal, anicteric sclera,  ENMT: external ears and nose appear normal, normal hearing, lips appears normal, has NG tube Neck: Supple, no masses CVS: S1-S2 clear, no murmur Respiratory: Decreased breath sound lower lobes otherwise clear to auscultation Abdomen: Has dressing in place, 2 drains, positive bowel sounds Musculoskeletal: Bilateral lower extremity BKA with dressing in place Neuro: She is awake and oriented, has debility with generalized weakness Psych: stable mood and affect, mental status Skin: no rashes, sacrococcygeal pressure ulcer unstageable, right ischium deep tissue pressure injury, right trochanter deep tissue pressure injury, left ischium deep tissue pressure injury  Data reviewed:  I have personally reviewed following labs and imaging studies Labs:  CBC: Recent Labs  Lab 03/06/19 1019 03/06/19 1138 03/07/19 0218 03/10/19 0241 03/11/19 0630  WBC SPECIMEN CONTAMINATED, UNABLE TO PERFORM TEST(S). 10.6* 9.8 11.5* 12.2*  NEUTROABS SPECIMEN CONTAMINATED, UNABLE TO PERFORM TEST(S). 6.8 6.0 7.3  --   HGB SPECIMEN CONTAMINATED, UNABLE TO PERFORM TEST(S). 9.5* 9.8* 9.0* 9.6*  HCT SPECIMEN CONTAMINATED, UNABLE TO PERFORM TEST(S). 29.9* 31.5* 29.5* 30.0*  MCV SPECIMEN CONTAMINATED, UNABLE TO PERFORM TEST(S). 92.0 93.8 93.9 90.4  PLT SPECIMEN CONTAMINATED, UNABLE TO PERFORM TEST(S). 382 367 424* 446*    Basic Metabolic Panel: Recent Labs  Lab 03/06/19 1019 03/06/19 1138 03/08/19 0212 03/09/19 0222 03/10/19 0241 03/10/19 1830 03/11/19 0630 03/12/19 0540  NA  --  134* 132* 130* 133*  --  130* 127*  K  --  5.2* 4.1 3.7 3.4*  --  4.2 4.8  CL  --  104 102 105 107  --  104 102  CO2  --  24 19* 18* 18*  --  16* 17*  GLUCOSE  --  108* 76 134* 103*  --  260* 438*  BUN  --  46* 49* 46* 47*  --  40* 27*  CREATININE  --  0.74 0.73 0.69 0.67  --  0.75 0.69  CALCIUM  --  8.2* 8.7* 8.3* 8.5*  --  8.6* 8.6*  MG SPECIMEN CONTAMINATED,  UNABLE TO PERFORM TEST(S). 2.2  --   --  2.2 2.0  --   --   PHOS SPECIMEN CONTAMINATED, UNABLE TO PERFORM TEST(S). 3.6  --   --  3.7  --   --  3.7   GFR Estimated Creatinine Clearance: 61.4 mL/min (by C-G formula based on SCr of 0.69 mg/dL). Liver Function Tests: Recent Labs  Lab 03/06/19 1138 03/11/19 0630 03/12/19 0540  AST 38 87* 68*  ALT 38 115* 107*  ALKPHOS 293* 577* 479*  BILITOT 0.3 0.2* 0.3  PROT 6.6 6.6 6.3*  ALBUMIN 1.4* 1.3* 1.2*   No results for input(s): LIPASE, AMYLASE in the last 168 hours. No results for input(s): AMMONIA in the last 168 hours. Coagulation profile No results for input(s): INR, PROTIME in the last 168 hours.  Cardiac Enzymes: No results for input(s): CKTOTAL, CKMB, CKMBINDEX, TROPONINI in the last 168 hours. BNP: Invalid input(s): POCBNP CBG: Recent Labs  Lab 03/09/19 0610 03/09/19 1155 03/09/19 1813 03/09/19 2317 03/10/19 1144  GLUCAP 115* 171* 99 157* 102*   D-Dimer No results for input(s): DDIMER in the last 72 hours. Hgb A1c Recent Labs    03/12/19 0540  HGBA1C 7.0*   Lipid Profile Recent Labs    03/10/19 0241  TRIG 117   Thyroid function studies Recent Labs    03/11/19 0630  TSH 2.668   Anemia work up No results for input(s): VITAMINB12, FOLATE, FERRITIN, TIBC, IRON, RETICCTPCT in the last 72 hours. Urinalysis    Component Value Date/Time   COLORURINE YELLOW 03/11/2019 1240   APPEARANCEUR CLEAR 03/11/2019 1240   LABSPEC 1.018 03/11/2019 1240   PHURINE 6.0 03/11/2019 1240   GLUCOSEU 50 (A) 03/11/2019 1240   HGBUR NEGATIVE 03/11/2019 [place] 03/11/2019 1240   BILIRUBINUR N 04/28/2016 1603   KETONESUR NEGATIVE 03/11/2019 1240   PROTEINUR 30 (A) 03/11/2019 1240   UROBILINOGEN 0.2 04/28/2016 1603   UROBILINOGEN 0.2 06/18/2013 0650   NITRITE NEGATIVE 03/11/2019 [place] 03/11/2019 1240     Microbiology Recent Results (from the past 240 hour(s))  Body fluid culture      Status: None   Collection Time: 03/04/19 12:54 PM   Specimen: Lung, Right; Pleural Fluid  Result Value Ref Range Status   Specimen Description   Final    PLEURAL RIGHT Performed at [facility], [place] [address], [place], [place] [address]    Special Requests   Final    NONE Performed at [facility], [place] [address], [place], [place] [address]    Gram Stain   Final    RARE WBC PRESENT,BOTH PMN AND MONONUCLEAR NO ORGANISMS SEEN    Culture   Final    NO GROWTH 3 DAYS Performed at [facility], [place] [address] [place], [place] [address]    Report Status 03/07/2019 FINAL  Final       Inpatient Medications:   Scheduled Meds: Continuous Infusions:   Radiological Exams on Admission: DG Chest Port 1 View  Result Date: 03/11/2019 CLINICAL DATA:  Respiratory failure EXAM: PORTABLE CHEST 1 VIEW COMPARISON:  Chest radiograph 03/04/2019 FINDINGS: An enteric tube passes below the level of the left hemidiaphragm with tip projecting in the region of the stomach. Unchanged position of a right-sided PICC. Overlying cardiac monitoring leads. Heart size within normal limits. Hazy opacity at the right lung base consistent with small layering effusion and/or atelectasis. Pneumonia is difficult to exclude. Persistent small left pleural effusion. No evidence of pneumothorax. No acute bony abnormality. IMPRESSION: Support apparatus unchanged. Hazy opacity at the right lung base consistent with small layering effusion and/or atelectasis. Pneumonia is difficult to exclude. Persistent small left pleural effusion. Electronically Signed   By: Kellie Simmering DO   On: 03/11/2019 07:14   DG Abd Portable 1V  Result Date: 03/11/2019 CLINICAL DATA:  NG tube placement EXAM: PORTABLE ABDOMEN - 1 VIEW COMPARISON:  02/17/2019 FINDINGS: Esophageal tube tip in the left upper quadrant beneath elevated left hemidiaphragm. Hazy airspace disease at the bases. Overall decreased  bowel gas. Drainage catheters in the mid abdominal region and lower abdomen/upper pelvis. IMPRESSION: 1. Esophageal tube tip in the left upper quadrant presumably over the proximal stomach, left diaphragm is elevated. 2. Overall decreased bowel gas. Electronically Signed   By: Donavan Foil M.D.   On: 03/11/2019 01:05   US Abdomen Limited RUQ  Result Date: 03/11/2019 CLINICAL DATA:  Right upper quadrant abdominal pain. EXAM: ULTRASOUND ABDOMEN LIMITED RIGHT UPPER QUADRANT COMPARISON:  CT scan 03/03/2019 FINDINGS: Gallbladder: The gallbladder is mildly distended. There is moderate gallbladder wall thickening measuring up to 5 mm. There is a single echogenic gallstone measuring 12 mm. No pericholecystic fluid or sonographic Murphy sign. Diffuse echogenic material throughout the gallbladder is likely gallbladder sludge. Hematoma is also possible. Common bile duct: Diameter: 2.7 mm Liver: Normal echogenicity without focal lesion or biliary dilatation. Portal vein is patent on color Doppler imaging with normal direction of blood flow towards the liver. Other: None. IMPRESSION: 1. Echogenic material filling the gallbladder, likely sludge but also possibly hematoma. 2. Single echogenic shadowing gallstone and moderate gallbladder wall thickening. 3. Normal caliber common bile duct. 4. Normal liver. Electronically Signed   By: Marijo Sanes M.D.   On: 03/11/2019 16:54    Impression/Recommendations Perforated gastric ulcer with acute peritonitis Sepsis with shock (shock resolved) Candidemia UTI with Klebsiella Postoperative abdominal wound Sacrococcygeal pressure ulcer unstageable Diabetes mellitus, uncontrolled Hyponatremia Severe peripheral vascular disease with bilateral BKA Right pleural effusion Anemia Hypertension Protein calorie malnutrition Dysphagia History of schizophrenia  Perforated gastric ulcer with acute peritonitis: She is s/p exploratory laparotomy with oversew of duodenal ulcer with  [name] patch on 11/23 for perforated prepyloric ulcer with gross contamination. Blood cultures have showed Candida glabrata, urine culture showed Klebsiella and peritoneal fluid showed Candida albicans. Ophthalmology was also consulted and there is no evidence of fungal endophthalmitis. Repeat CT scan of the abdomen pelvis on 12/15 and 12/21 showed persistent large fluid collection in the anterior abdomen, possible leak. IR placed left lower quadrant anterior peritoneal drain catheter on 12/16. Cultures showed persistent Candida glabrata, Enterococcus faecalis, stenotrophomonas.  Currently on treatment with Unasyn, Bactrim, anidulafungin.  Recommend to continue current antimicrobials until January 4.  She will need repeat imaging to ensure resolution of the fluid collection.She also has a right lower quadrant JP drain with suction and  midline surgical wounds along with an NG tube.   Sepsis with shock: This was at the outside facility.  Currently shock is resolved.  On antimicrobials as mentioned above.  However, she is at very high risk for recurrent sepsis.  If she starts having any worsening fevers, leukocytosis then would recommend to send for repeat cultures.  Candidemia: Patient blood cultures at the outside facility showed Candida albicans.  This was likely secondary to the perforated ulcer and the peritonitis.  Antimicrobials as mentioned above.  Again, if she starts having any worsening fevers, worsening leukocytosis would recommend to repeat blood cultures.  UTI: Patient was found to have UTI with Klebsiella at the outside facility.  Antimicrobials as mentioned above.  Sacrococcygeal pressure ulcer: Unstageable: On antimicrobials as mentioned above.  Continue local wound care.  Due to her debility she is very high risk for worsening.  If the ulcer worsens further consider surgery consult.  Diabetes mellitus: She has uncontrolled diabetes mellitus and was in DKA at the outside facility on IV  insulin drip.  Continue to monitor Accu-Cheks, management per the primary team.  Right pleural effusion: Patient is status post thoracentesis.  Respiratory status appears to be stable at this time.  Continue to monitor.  Anemia: She required a transfusion at the outside facility.  There was some concern about GI bleed however CT angio did not show any source of bleeding.  Continue to monitor hemoglobin.  Further management per the primary team.  Severe peripheral vascular disease with bilateral BKA: Patient while admitted at [facility] was noted to have some worsening of her bilateral BKA likely secondary to ischemia from the septic shock.  Currently has dressing in place.  Continue local wound care.  If any further worsening, she will need to be evaluated for AKA once her acute issues resolve.  Protein calorie malnutrition: On TPN.  Due to her being on TPN she is high risk for fungemia.  Dysphagia: Due to her dysphagia she is high risk for aspiration and worsening respiratory failure secondary to aspiration pneumonia.  History of schizophrenia: Continue medication and management per primary team.  Recent acute renal failure: At this time appears to be stable.  Please monitor BUN/trending closely especially while on antibiotics.  Due to her complex medical problems she is high risk for worsening and decompensation.  Thank you for this consultation.    Yaakov Guthrie M.D. 03/12/2019, 5:57 PM

## 2019-03-13 LAB — CBC
HCT: 29 % — ABNORMAL LOW (ref 36.0–46.0)
Hemoglobin: 9.5 g/dL — ABNORMAL LOW (ref 12.0–15.0)
MCH: 29.1 pg (ref 26.0–34.0)
MCHC: 32.8 g/dL (ref 30.0–36.0)
MCV: 89 fL (ref 80.0–100.0)
Platelets: 449 10*3/uL — ABNORMAL HIGH (ref 150–400)
RBC: 3.26 MIL/uL — ABNORMAL LOW (ref 3.87–5.11)
RDW: 14.7 % (ref 11.5–15.5)
WBC: 10.5 10*3/uL (ref 4.0–10.5)
nRBC: 0 % (ref 0.0–0.2)

## 2019-03-14 LAB — COMPREHENSIVE METABOLIC PANEL
ALT: 58 U/L — ABNORMAL HIGH (ref 0–44)
AST: 28 U/L (ref 15–41)
Albumin: 1.3 g/dL — ABNORMAL LOW (ref 3.5–5.0)
Alkaline Phosphatase: 361 U/L — ABNORMAL HIGH (ref 38–126)
Anion gap: 6 (ref 5–15)
BUN: 24 mg/dL — ABNORMAL HIGH (ref 6–20)
CO2: 20 mmol/L — ABNORMAL LOW (ref 22–32)
Calcium: 8.7 mg/dL — ABNORMAL LOW (ref 8.9–10.3)
Chloride: 100 mmol/L (ref 98–111)
Creatinine, Ser: 0.73 mg/dL (ref 0.44–1.00)
GFR calc Af Amer: >60 mL/min (ref >60)
GFR calc non Af Amer: >60 mL/min (ref >60)
Glucose, Bld: 268 mg/dL — ABNORMAL HIGH (ref 70–99)
Potassium: 5.2 mmol/L — ABNORMAL HIGH (ref 3.5–5.1)
Sodium: 126 mmol/L — ABNORMAL LOW (ref 135–145)
Total Bilirubin: 0.4 mg/dL (ref 0.3–1.2)
Total Protein: 6.2 g/dL — ABNORMAL LOW (ref 6.5–8.1)

## 2019-03-14 LAB — MAGNESIUM: Magnesium: 1.4 mg/dL — ABNORMAL LOW (ref 1.7–2.4)

## 2019-03-14 LAB — TRIGLYCERIDES: Triglycerides: 137 mg/dL (ref <150)

## 2019-03-14 LAB — PHOSPHORUS: Phosphorus: 3.9 mg/dL (ref 2.5–4.6)

## 2019-03-14 LAB — POTASSIUM: Potassium: 5 mmol/L (ref 3.5–5.1)

## 2019-03-15 LAB — CBC
HCT: 25.2 % — ABNORMAL LOW (ref 36.0–46.0)
Hemoglobin: 8.5 g/dL — ABNORMAL LOW (ref 12.0–15.0)
MCH: 29.6 pg (ref 26.0–34.0)
MCHC: 33.7 g/dL (ref 30.0–36.0)
MCV: 87.8 fL (ref 80.0–100.0)
Platelets: 409 10*3/uL — ABNORMAL HIGH (ref 150–400)
RBC: 2.87 MIL/uL — ABNORMAL LOW (ref 3.87–5.11)
RDW: 14.6 % (ref 11.5–15.5)
WBC: 10.7 10*3/uL — ABNORMAL HIGH (ref 4.0–10.5)
nRBC: 0 % (ref 0.0–0.2)

## 2019-03-15 LAB — BASIC METABOLIC PANEL
Anion gap: 9 (ref 5–15)
BUN: 22 mg/dL — ABNORMAL HIGH (ref 6–20)
CO2: 21 mmol/L — ABNORMAL LOW (ref 22–32)
Calcium: 8.5 mg/dL — ABNORMAL LOW (ref 8.9–10.3)
Chloride: 97 mmol/L — ABNORMAL LOW (ref 98–111)
Creatinine, Ser: 0.61 mg/dL (ref 0.44–1.00)
GFR calc Af Amer: >60 mL/min (ref >60)
GFR calc non Af Amer: >60 mL/min (ref >60)
Glucose, Bld: 180 mg/dL — ABNORMAL HIGH (ref 70–99)
Potassium: 5.1 mmol/L (ref 3.5–5.1)
Sodium: 127 mmol/L — ABNORMAL LOW (ref 135–145)

## 2019-03-16 LAB — POTASSIUM: Potassium: 5 mmol/L (ref 3.5–5.1)

## 2019-03-16 LAB — MAGNESIUM: Magnesium: 1.6 mg/dL — ABNORMAL LOW (ref 1.7–2.4)

## 2019-03-17 LAB — PHOSPHORUS: Phosphorus: 4 mg/dL (ref 2.5–4.6)

## 2019-03-17 LAB — COMPREHENSIVE METABOLIC PANEL
ALT: 29 U/L (ref 0–44)
AST: 21 U/L (ref 15–41)
Albumin: 1.2 g/dL — ABNORMAL LOW (ref 3.5–5.0)
Alkaline Phosphatase: 289 U/L — ABNORMAL HIGH (ref 38–126)
Anion gap: 7 (ref 5–15)
BUN: 15 mg/dL (ref 6–20)
CO2: 24 mmol/L (ref 22–32)
Calcium: 8.4 mg/dL — ABNORMAL LOW (ref 8.9–10.3)
Chloride: 94 mmol/L — ABNORMAL LOW (ref 98–111)
Creatinine, Ser: 0.57 mg/dL (ref 0.44–1.00)
GFR calc Af Amer: >60 mL/min (ref >60)
GFR calc non Af Amer: >60 mL/min (ref >60)
Glucose, Bld: 171 mg/dL — ABNORMAL HIGH (ref 70–99)
Potassium: 5.1 mmol/L (ref 3.5–5.1)
Sodium: 125 mmol/L — ABNORMAL LOW (ref 135–145)
Total Bilirubin: 0.2 mg/dL — ABNORMAL LOW (ref 0.3–1.2)
Total Protein: 5.9 g/dL — ABNORMAL LOW (ref 6.5–8.1)

## 2019-03-17 LAB — CBC
HCT: 23.9 % — ABNORMAL LOW (ref 36.0–46.0)
Hemoglobin: 7.9 g/dL — ABNORMAL LOW (ref 12.0–15.0)
MCH: 29 pg (ref 26.0–34.0)
MCHC: 33.1 g/dL (ref 30.0–36.0)
MCV: 87.9 fL (ref 80.0–100.0)
Platelets: 329 10*3/uL (ref 150–400)
RBC: 2.72 MIL/uL — ABNORMAL LOW (ref 3.87–5.11)
RDW: 14.3 % (ref 11.5–15.5)
WBC: 7.1 10*3/uL (ref 4.0–10.5)
nRBC: 0 % (ref 0.0–0.2)

## 2019-03-17 LAB — MAGNESIUM
Magnesium: 1.8 mg/dL (ref 1.7–2.4)
Magnesium: 1.6 mg/dL — ABNORMAL LOW (ref 1.7–2.4)

## 2019-03-18 LAB — BASIC METABOLIC PANEL
Anion gap: 8 (ref 5–15)
BUN: 22 mg/dL — ABNORMAL HIGH (ref 6–20)
CO2: 24 mmol/L (ref 22–32)
Calcium: 8.6 mg/dL — ABNORMAL LOW (ref 8.9–10.3)
Chloride: 93 mmol/L — ABNORMAL LOW (ref 98–111)
Creatinine, Ser: 0.64 mg/dL (ref 0.44–1.00)
GFR calc Af Amer: >60 mL/min (ref >60)
GFR calc non Af Amer: >60 mL/min (ref >60)
Glucose, Bld: 164 mg/dL — ABNORMAL HIGH (ref 70–99)
Potassium: 5.5 mmol/L — ABNORMAL HIGH (ref 3.5–5.1)
Sodium: 125 mmol/L — ABNORMAL LOW (ref 135–145)

## 2019-03-19 LAB — COMPREHENSIVE METABOLIC PANEL
ALT: 30 U/L (ref 0–44)
AST: 29 U/L (ref 15–41)
Albumin: 1.3 g/dL — ABNORMAL LOW (ref 3.5–5.0)
Alkaline Phosphatase: 331 U/L — ABNORMAL HIGH (ref 38–126)
Anion gap: 9 (ref 5–15)
BUN: 22 mg/dL — ABNORMAL HIGH (ref 6–20)
CO2: 27 mmol/L (ref 22–32)
Calcium: 8.8 mg/dL — ABNORMAL LOW (ref 8.9–10.3)
Chloride: 91 mmol/L — ABNORMAL LOW (ref 98–111)
Creatinine, Ser: 0.67 mg/dL (ref 0.44–1.00)
GFR calc Af Amer: >60 mL/min (ref >60)
GFR calc non Af Amer: >60 mL/min (ref >60)
Glucose, Bld: 140 mg/dL — ABNORMAL HIGH (ref 70–99)
Potassium: 5.1 mmol/L (ref 3.5–5.1)
Sodium: 127 mmol/L — ABNORMAL LOW (ref 135–145)
Total Bilirubin: 0.2 mg/dL — ABNORMAL LOW (ref 0.3–1.2)
Total Protein: 6.6 g/dL (ref 6.5–8.1)

## 2019-03-19 LAB — MAGNESIUM: Magnesium: 1.6 mg/dL — ABNORMAL LOW (ref 1.7–2.4)

## 2019-03-19 LAB — PHOSPHORUS: Phosphorus: 4.4 mg/dL (ref 2.5–4.6)

## 2019-03-20 LAB — SODIUM: Sodium: 126 mmol/L — ABNORMAL LOW (ref 135–145)

## 2019-03-20 LAB — CORTISOL: Cortisol, Plasma: 14.8 ug/dL

## 2019-03-21 ENCOUNTER — Other Ambulatory Visit (HOSPITAL_COMMUNITY): Payer: Self-pay

## 2019-03-21 LAB — COMPREHENSIVE METABOLIC PANEL
ALT: 43 U/L (ref 0–44)
AST: 42 U/L — ABNORMAL HIGH (ref 15–41)
Albumin: 1.2 g/dL — ABNORMAL LOW (ref 3.5–5.0)
Alkaline Phosphatase: 388 U/L — ABNORMAL HIGH (ref 38–126)
Anion gap: 9 (ref 5–15)
BUN: 25 mg/dL — ABNORMAL HIGH (ref 6–20)
CO2: 27 mmol/L (ref 22–32)
Calcium: 8.7 mg/dL — ABNORMAL LOW (ref 8.9–10.3)
Chloride: 92 mmol/L — ABNORMAL LOW (ref 98–111)
Creatinine, Ser: 0.86 mg/dL (ref 0.44–1.00)
GFR calc Af Amer: >60 mL/min (ref >60)
GFR calc non Af Amer: >60 mL/min (ref >60)
Glucose, Bld: 206 mg/dL — ABNORMAL HIGH (ref 70–99)
Potassium: 4.5 mmol/L (ref 3.5–5.1)
Sodium: 128 mmol/L — ABNORMAL LOW (ref 135–145)
Total Bilirubin: 0.3 mg/dL (ref 0.3–1.2)
Total Protein: 6.3 g/dL — ABNORMAL LOW (ref 6.5–8.1)

## 2019-03-21 LAB — PHOSPHORUS: Phosphorus: 4.5 mg/dL (ref 2.5–4.6)

## 2019-03-21 LAB — MAGNESIUM: Magnesium: 1.7 mg/dL (ref 1.7–2.4)

## 2019-03-21 LAB — TRIGLYCERIDES: Triglycerides: 200 mg/dL — ABNORMAL HIGH (ref <150)

## 2019-03-22 ENCOUNTER — Other Ambulatory Visit (HOSPITAL_COMMUNITY): Payer: Self-pay

## 2019-03-22 LAB — URINALYSIS, ROUTINE W REFLEX MICROSCOPIC
Bilirubin Urine: NEGATIVE
Glucose, UA: NEGATIVE mg/dL
Ketones, ur: NEGATIVE mg/dL
Nitrite: NEGATIVE
Protein, ur: 100 mg/dL — AB
Specific Gravity, Urine: 1.024 (ref 1.005–1.030)
pH: 5 (ref 5.0–8.0)

## 2019-03-22 LAB — MAGNESIUM: Magnesium: 1.6 mg/dL — ABNORMAL LOW (ref 1.7–2.4)

## 2019-03-22 LAB — SODIUM: Sodium: 131 mmol/L — ABNORMAL LOW (ref 135–145)

## 2019-03-23 LAB — C DIFFICILE QUICK SCREEN W PCR REFLEX
C Diff antigen: NEGATIVE
C Diff interpretation: NOT DETECTED
C Diff toxin: NEGATIVE

## 2019-03-23 LAB — MAGNESIUM: Magnesium: 2.1 mg/dL (ref 1.7–2.4)

## 2019-03-23 LAB — EXPECTORATED SPUTUM ASSESSMENT W GRAM STAIN, RFLX TO RESP C

## 2019-03-23 LAB — SODIUM: Sodium: 127 mmol/L — ABNORMAL LOW (ref 135–145)

## 2019-03-24 LAB — MAGNESIUM: Magnesium: 1.8 mg/dL (ref 1.7–2.4)

## 2019-03-24 LAB — CBC
HCT: 22.8 % — ABNORMAL LOW (ref 36.0–46.0)
Hemoglobin: 7.4 g/dL — ABNORMAL LOW (ref 12.0–15.0)
MCH: 29.1 pg (ref 26.0–34.0)
MCHC: 32.5 g/dL (ref 30.0–36.0)
MCV: 89.8 fL (ref 80.0–100.0)
Platelets: 264 10*3/uL (ref 150–400)
RBC: 2.54 MIL/uL — ABNORMAL LOW (ref 3.87–5.11)
RDW: 13.9 % (ref 11.5–15.5)
WBC: 7.6 10*3/uL (ref 4.0–10.5)
nRBC: 0 % (ref 0.0–0.2)

## 2019-03-24 LAB — COMPREHENSIVE METABOLIC PANEL
ALT: 46 U/L — ABNORMAL HIGH (ref 0–44)
AST: 39 U/L (ref 15–41)
Albumin: 1.2 g/dL — ABNORMAL LOW (ref 3.5–5.0)
Alkaline Phosphatase: 356 U/L — ABNORMAL HIGH (ref 38–126)
Anion gap: 9 (ref 5–15)
BUN: 36 mg/dL — ABNORMAL HIGH (ref 6–20)
CO2: 24 mmol/L (ref 22–32)
Calcium: 8.7 mg/dL — ABNORMAL LOW (ref 8.9–10.3)
Chloride: 95 mmol/L — ABNORMAL LOW (ref 98–111)
Creatinine, Ser: 0.98 mg/dL (ref 0.44–1.00)
GFR calc Af Amer: >60 mL/min (ref >60)
GFR calc non Af Amer: >60 mL/min (ref >60)
Glucose, Bld: 113 mg/dL — ABNORMAL HIGH (ref 70–99)
Potassium: 4.4 mmol/L (ref 3.5–5.1)
Sodium: 128 mmol/L — ABNORMAL LOW (ref 135–145)
Total Bilirubin: 0.6 mg/dL (ref 0.3–1.2)
Total Protein: 6.2 g/dL — ABNORMAL LOW (ref 6.5–8.1)

## 2019-03-24 LAB — URINE CULTURE: Culture: NO GROWTH

## 2019-03-24 LAB — PHOSPHORUS: Phosphorus: 4 mg/dL (ref 2.5–4.6)

## 2019-03-25 ENCOUNTER — Other Ambulatory Visit (HOSPITAL_COMMUNITY): Payer: Self-pay

## 2019-03-25 LAB — CBC
HCT: 23.1 % — ABNORMAL LOW (ref 36.0–46.0)
Hemoglobin: 7.3 g/dL — ABNORMAL LOW (ref 12.0–15.0)
MCH: 28.6 pg (ref 26.0–34.0)
MCHC: 31.6 g/dL (ref 30.0–36.0)
MCV: 90.6 fL (ref 80.0–100.0)
Platelets: 316 10*3/uL (ref 150–400)
RBC: 2.55 MIL/uL — ABNORMAL LOW (ref 3.87–5.11)
RDW: 14 % (ref 11.5–15.5)
WBC: 6.7 10*3/uL (ref 4.0–10.5)
nRBC: 0 % (ref 0.0–0.2)

## 2019-03-25 LAB — VANCOMYCIN, TROUGH: Vancomycin Tr: 13 ug/mL — ABNORMAL LOW (ref 15–20)

## 2019-03-25 LAB — SODIUM: Sodium: 130 mmol/L — ABNORMAL LOW (ref 135–145)

## 2019-03-25 MED ORDER — IOHEXOL 300 MG/ML  SOLN
100.0000 mL | Freq: Once | INTRAMUSCULAR | Status: AC | PRN
  Administered 2019-03-25: 16:00:00 100 mL via INTRAVENOUS

## 2019-03-26 ENCOUNTER — Other Ambulatory Visit (HOSPITAL_COMMUNITY): Payer: Self-pay

## 2019-03-26 ENCOUNTER — Institutional Professional Consult (permissible substitution) (HOSPITAL_COMMUNITY): Payer: Self-pay

## 2019-03-26 LAB — COMPREHENSIVE METABOLIC PANEL
ALT: 20 U/L (ref 0–44)
AST: 9 U/L — ABNORMAL LOW (ref 15–41)
Albumin: 1.2 g/dL — ABNORMAL LOW (ref 3.5–5.0)
Alkaline Phosphatase: 258 U/L — ABNORMAL HIGH (ref 38–126)
Anion gap: 11 (ref 5–15)
BUN: 15 mg/dL (ref 6–20)
CO2: 19 mmol/L — ABNORMAL LOW (ref 22–32)
Calcium: 8.1 mg/dL — ABNORMAL LOW (ref 8.9–10.3)
Chloride: 101 mmol/L (ref 98–111)
Creatinine, Ser: 0.67 mg/dL (ref 0.44–1.00)
GFR calc Af Amer: >60 mL/min (ref >60)
GFR calc non Af Amer: >60 mL/min (ref >60)
Glucose, Bld: 115 mg/dL — ABNORMAL HIGH (ref 70–99)
Potassium: 3.8 mmol/L (ref 3.5–5.1)
Sodium: 131 mmol/L — ABNORMAL LOW (ref 135–145)
Total Bilirubin: 0.7 mg/dL (ref 0.3–1.2)
Total Protein: 6 g/dL — ABNORMAL LOW (ref 6.5–8.1)

## 2019-03-26 LAB — PHOSPHORUS: Phosphorus: 3.1 mg/dL (ref 2.5–4.6)

## 2019-03-26 LAB — CBC
HCT: 22.7 % — ABNORMAL LOW (ref 36.0–46.0)
Hemoglobin: 7.1 g/dL — ABNORMAL LOW (ref 12.0–15.0)
MCH: 28.3 pg (ref 26.0–34.0)
MCHC: 31.3 g/dL (ref 30.0–36.0)
MCV: 90.4 fL (ref 80.0–100.0)
Platelets: 327 10*3/uL (ref 150–400)
RBC: 2.51 MIL/uL — ABNORMAL LOW (ref 3.87–5.11)
RDW: 13.9 % (ref 11.5–15.5)
WBC: 6.3 10*3/uL (ref 4.0–10.5)
nRBC: 0 % (ref 0.0–0.2)

## 2019-03-26 LAB — MAGNESIUM: Magnesium: 1.8 mg/dL (ref 1.7–2.4)

## 2019-03-26 LAB — HEPARIN INDUCED PLATELET AB (HIT ANTIBODY): Heparin Induced Plt Ab: 0.134 OD (ref 0.000–0.400)

## 2019-03-27 LAB — CULTURE, BLOOD (ROUTINE X 2)
Culture: NO GROWTH
Culture: NO GROWTH

## 2019-03-27 LAB — PHOSPHORUS: Phosphorus: 3.2 mg/dL (ref 2.5–4.6)

## 2019-03-27 LAB — MAGNESIUM: Magnesium: 1.7 mg/dL (ref 1.7–2.4)

## 2019-03-27 LAB — SODIUM: Sodium: 134 mmol/L — ABNORMAL LOW (ref 135–145)

## 2019-03-27 LAB — TRIGLYCERIDES: Triglycerides: 252 mg/dL — ABNORMAL HIGH (ref <150)

## 2019-03-28 LAB — PHOSPHORUS: Phosphorus: 4 mg/dL (ref 2.5–4.6)

## 2019-03-28 LAB — BODY FLUID CULTURE: Gram Stain: NONE SEEN

## 2019-03-28 LAB — COMPREHENSIVE METABOLIC PANEL
ALT: 14 U/L (ref 0–44)
AST: 14 U/L — ABNORMAL LOW (ref 15–41)
Albumin: 1.2 g/dL — ABNORMAL LOW (ref 3.5–5.0)
Alkaline Phosphatase: 213 U/L — ABNORMAL HIGH (ref 38–126)
Anion gap: 8 (ref 5–15)
BUN: 10 mg/dL (ref 6–20)
CO2: 20 mmol/L — ABNORMAL LOW (ref 22–32)
Calcium: 7.9 mg/dL — ABNORMAL LOW (ref 8.9–10.3)
Chloride: 103 mmol/L (ref 98–111)
Creatinine, Ser: 0.61 mg/dL (ref 0.44–1.00)
GFR calc Af Amer: >60 mL/min (ref >60)
GFR calc non Af Amer: >60 mL/min (ref >60)
Glucose, Bld: 184 mg/dL — ABNORMAL HIGH (ref 70–99)
Potassium: 3.6 mmol/L (ref 3.5–5.1)
Sodium: 131 mmol/L — ABNORMAL LOW (ref 135–145)
Total Bilirubin: 0.7 mg/dL (ref 0.3–1.2)
Total Protein: 5.8 g/dL — ABNORMAL LOW (ref 6.5–8.1)

## 2019-03-28 LAB — CULTURE, RESPIRATORY W GRAM STAIN

## 2019-03-28 LAB — TRIGLYCERIDES: Triglycerides: 140 mg/dL (ref <150)

## 2019-03-28 LAB — MAGNESIUM: Magnesium: 1.8 mg/dL (ref 1.7–2.4)

## 2019-03-28 NOTE — Progress Notes (Signed)
PROGRESS NOTE    Gina [name]  [id number] DOB: April 08, 1960 DOA: 03/10/2019  Brief Narrative:  Gina [name] is an 59 y.o. 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 She had "flu-like symptoms" for the past few days. In the ED, she was hypothermic and severely hypotensive. Initial blood glucose >1000. 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 The scan also noted cholelithiasis without cholecystitis and bibasilar airspace disease. CT head no acute abnormality. CXR wnl. While in the ED, she was given 6L IVF and started on low-dose Levophed through a peripheral IV. She was also started on insulin infusion per DKA protocol. She received vancomycin, cefepime, metronidazole and fluconazole.  She was admitted with septic shock.  Surgical consult was called.She was initially intubated. She was treated with broad-spectrum antibiotics, pressors, fluids, and general surgery took her for an ex lap on 11/23 with findings of perforated prepyloric ulcer which was repaired, she was also found to have contaminated peritoneal fluid which was drained. She was extubated 11/29. Her hospital course complicated by DKA, acute kidney injury, acute blood loss anemia, thrombocytopenia, intra-abdominal infection as well as bacteremia and fungemia. Blood cultures showed Candida glabrata, urine culture showed Klebsiella and peritoneal fluid showed Candida albicans. Ophthalmology was also consulted and there is no evidence of fungal endophthalmitis. CT of the abdomen done on 12/15 showed persistent abdominal collection/leak. IR placed left lower quadrant anterior peritoneal  drain on 12/16. On 12/21, she has had multiple bloody bowel movements including bloody output from JP drain. CT angiogram did not show any active bleeding but showed persistent large complex anterior peritoneal fluid collection. GI was consulted and evaluated patient. Since 12/24 hemodynamically stable, mental status improved. Due to poor prognosis, palliative was consulted and discussions were held regarding goals of care with family reluctant.ID was consulted for her infections and recommended current antibiotic regimen to continue till January 4 and then be monitored off antibiotics. She was on treatment with Unasyn, Bactrim, anidulafungin. She also a right lower quadrant JP drain with suction and midline surgical wounds.  03/28/2019:  After completion of antibiotics, she had fevers and continued to complain of abdominal pain.  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  Today she is still complaining of abdominal pain which she says has been chronic.  However, afebrile.  She denies having any nausea or vomiting.  Per primary team she has been seen by Dr. Noberto Retort of surgery.  Assessment & Plan: Intra-abdominal abscess  Perforated gastric ulcer with acute peritonitis Systemic inflammatory response syndrome Sepsis with shock (shock resolved) Candidemia UTI with Klebsiella Postoperative abdominal wound Sacrococcygeal pressure ulcer unstageable Diabetes mellitus, uncontrolled Severe peripheral vascular disease with bilateral BKA Right pleural effusion Anemia Hypertension Protein calorie  malnutrition Dysphagia History of schizophrenia  Perforated gastric ulcer with acute peritonitis/intra-abdominal abscess: She is s/p exploratory laparotomy with oversew of duodenal ulcer with [name] patch on 11/23 for perforated  prepyloric ulcer with gross contamination. Blood cultures previously showed Candida glabrata, urine culture showed Klebsiella and peritoneal fluid showed Candida albicans. Ophthalmology was also consulted and there is no evidence of fungal endophthalmitis. Repeat CT scan of the abdomen pelvis on 12/15 and 12/21 showed persistent large fluid collection in the anterior abdomen, possible leak. IR placed left lower quadrant anterior peritoneal drain catheter on 12/16. Cultures showed persistent Candida glabrata, Enterococcus faecalis, stenotrophomonas. Completed treatment with Unasyn, Bactrim, anidulafungin January 4.  03/28/19: However, patient started having fever.  She has always had abdominal pain.  CT done on 03/25/2019 per report continues to show persistent intraperitoneal gas and fluid collection with pigtail catheter in the collection.   Fluid cultures preliminary report showing Klebsiella, Providentia, moderate Candida glabrata.  Final culture still pending at this time. Currently on treatment with IV vancomycin, ceftriaxone, Flagyl.  Will add fluconazole to her antimicrobial regimen.  Once the cultures are finalized and she does not have any evidence of MRSA we could probably discontinue the vancomycin but continue the other antimicrobials.  She will need repeat imaging in the next 7 to 10 days to ensure resolution of the fluid collection. If she has persistent fluid collection on the repeat imaging despite antimicrobial therapy then we may need to consider reconsulting surgery.  Per primary team patient was seen by Dr. Noberto Retort from Surgery.  SIRS: Likely secondary to the intra-abdominal abscess.  Previously also had UTI.  On antimicrobials as mentioned above.  Patient is  at high risk for sepsis.  If she starts having any worsening fevers or worsening leukocytosis would recommend to send for repeat pancultures.    Sepsis with shock: This was at the outside facility.  Currently shock is resolved.  On antimicrobials as mentioned above.  However, she is at very high risk for recurrent sepsis.  If she starts having any worsening fevers, leukocytosis then would recommend to send for repeat cultures.  Candidemia: Patient blood cultures at the outside facility showed Candida albicans.  This was likely secondary to the perforated ulcer and the peritonitis.  Antimicrobials as mentioned above.  Again, if she starts having any worsening fevers, worsening leukocytosis would recommend to repeat blood cultures.  UTI: Patient was found to have UTI with Klebsiella at the outside facility.  Antimicrobials as mentioned above.  Again, if she starts having any worsening fevers or leukocytosis would recommend to send for repeat pancultures including UA and urine cultures.  Sacrococcygeal pressure ulcer: Unstageable: On antimicrobials as mentioned above.  Continue local wound care.  Due to her debility she is very high risk for worsening.  If the pressure ulcer worsens further consider surgery consult.  Diabetes mellitus: She has uncontrolled diabetes mellitus and was in DKA at the outside facility on IV insulin drip.  At this time DKA resolved. Continue to monitor Accu-Cheks, management per the primary team.  Right pleural effusion: Patient is status post thoracentesis.  Respiratory status appears to be stable at this time.  Continue to monitor.  Anemia: She required a transfusion at the outside facility.  There was some concern about GI bleed however CT angio did not show any source of bleeding.  Continue to monitor hemoglobin.  Further management per the primary team.  Severe peripheral vascular disease with bilateral BKA: Patient while admitted at [facility] was noted to have some  worsening of her bilateral BKA likely secondary to ischemia from the septic shock.  Currently has dressing in place.  Continue local wound care.  If any further  worsening, she will need to be evaluated for AKA once her acute issues resolve.  Protein calorie malnutrition: On TPN.  Due to her being on TPN she is high risk for fungemia.  Dysphagia: Due to her dysphagia she is high risk for aspiration and worsening respiratory failure secondary to aspiration pneumonia.  History of schizophrenia: Continue medication and management per primary team.  Recent acute renal failure: At this time appears to be stable.  Please monitor BUN/trending closely especially while on antibiotics.  Plan of care discussed with primary attending Dr. Owens Shark.  Also discussed with pharmacy.  Due to her complex medical problems she is high risk for worsening and decompensation.   Subjective: She is continuing to complain of abdominal pain.  On TPN.  Denies having any nausea or vomiting, diarrhea or dysuria at this time.  Objective: Temperature 98.7, pulse 84, respiratory 32, blood pressure 128/67, pulse oximetry 98%  Examination: General: Chronically ill-appearing female, awake and oriented, does not appear to be in any acute distress at this time Eyes: PERLA, EOMI, irises appear normal, anicteric sclera,  ENMT: external ears and nose appear normal, normal hearing, lips appears normal, has NG tube Neck: Supple, no masses CVS: S1-S2 clear, murmur Respiratory: Decreased breath sound lower lobes otherwise clear to auscultation Abdomen: Has dressing in place,drain, ostomy, positive bowel sounds Musculoskeletal: Bilateral lower extremity BKA with dressing in place Neuro: She is awake and oriented, has debility with generalized weakness Psych: stable mood and affect, mental status Skin: no rashes, sacrococcygeal pressure ulcer unstageable, right ischium deep tissue pressure injury, right trochanter deep tissue  pressure injury, left ischium deep tissue pressure injury     Data Reviewed: I have personally reviewed following labs and imaging studies  CBC: Recent Labs  Lab 03/24/19 0527 03/25/19 1448 03/26/19 0540  WBC 7.6 6.7 6.3  HGB 7.4* 7.3* 7.1*  HCT 22.8* 23.1* 22.7*  MCV 89.8 90.6 90.4  PLT 264 316 [id number]   Basic Metabolic Panel: Recent Labs  Lab 03/22/19 0528 03/23/19 0336 03/23/19 1336 03/24/19 0527 03/24/19 1737 03/25/19 0719 03/26/19 0540 03/26/19 1801 03/27/19 0445 03/28/19 0401  NA 131*   < >  --  128*  --  130* 131*  --  134* 131*  K  --   --   --  4.4  --   --  3.8  --   --  3.6  CL  --   --   --  95*  --   --  101  --   --  103  CO2  --   --   --  24  --   --  19*  --   --  20*  GLUCOSE  --   --   --  113*  --   --  115*  --   --  184*  BUN  --   --   --  36*  --   --  15  --   --  10  CREATININE  --   --   --  0.98  --   --  0.67  --   --  0.61  CALCIUM  --   --   --  8.7*  --   --  8.1*  --   --  7.9*  MG 1.6*  --  2.1  --  1.8  --   --  1.8 1.7  --   PHOS  --   --   --  4.0  --   --  3.1  --  3.2 4.0   < > = values in this interval not displayed.   GFR: CrCl cannot be calculated (Unknown ideal weight.). Liver Function Tests: Recent Labs  Lab 03/24/19 0527 03/26/19 0540 03/28/19 0401  AST 39 9* 14*  ALT 46* 20 14  ALKPHOS 356* 258* 213*  BILITOT 0.6 0.7 0.7  PROT 6.2* 6.0* 5.8*  ALBUMIN 1.2* 1.2* 1.2*   No results for input(s): LIPASE, AMYLASE in the last 168 hours. No results for input(s): AMMONIA in the last 168 hours. Coagulation Profile: No results for input(s): INR, PROTIME in the last 168 hours. Cardiac Enzymes: No results for input(s): CKTOTAL, CKMB, CKMBINDEX, TROPONINI in the last 168 hours. BNP (last 3 results) No results for input(s): PROBNP in the last 8760 hours. HbA1C: No results for input(s): HGBA1C in the last 72 hours. CBG: No results for input(s): GLUCAP in the last 168 hours. Lipid Profile: Recent Labs    03/27/19 0445   TRIG 252*   Thyroid Function Tests: No results for input(s): TSH, T4TOTAL, FREET4, T3FREE, THYROIDAB in the last 72 hours. Anemia Panel: No results for input(s): VITAMINB12, FOLATE, FERRITIN, TIBC, IRON, RETICCTPCT in the last 72 hours. Sepsis Labs: No results for input(s): PROCALCITON, LATICACIDVEN in the last 168 hours.  Recent Results (from the past 240 hour(s))  Culture, blood (routine x 2)     Status: None   Collection Time: 03/22/19  5:56 PM   Specimen: BLOOD RIGHT HAND  Result Value Ref Range Status   Specimen Description BLOOD RIGHT HAND  Final   Special Requests   Final    BOTTLES DRAWN AEROBIC AND ANAEROBIC Blood Culture results may not be optimal due to an inadequate volume of blood received in culture bottles   Culture   Final    NO GROWTH 5 DAYS Performed at [facility], [place] [address], [place], [place] [address]    Report Status 03/27/2019 FINAL  Final  Culture, blood (routine x 2)     Status: None   Collection Time: 03/22/19  6:00 PM   Specimen: BLOOD LEFT HAND  Result Value Ref Range Status   Specimen Description BLOOD LEFT HAND  Final   Special Requests   Final    BOTTLES DRAWN AEROBIC AND ANAEROBIC Blood Culture results may not be optimal due to an inadequate volume of blood received in culture bottles   Culture   Final    NO GROWTH 5 DAYS Performed at [facility], [place] [address], [place], [place] [address]    Report Status 03/27/2019 FINAL  Final  C difficile quick scan w PCR reflex     Status: None   Collection Time: 03/22/19  8:00 PM   Specimen: STOOL  Result Value Ref Range Status   C Diff antigen NEGATIVE NEGATIVE Final   C Diff toxin NEGATIVE NEGATIVE Final   C Diff interpretation No C. difficile detected.  Final    Comment: Performed at [facility], [place] [address] [place], [place] [address]  Culture, Urine     Status: None   Collection Time: 03/22/19  8:52 PM   Specimen: Urine, Random  Result Value Ref Range Status    Specimen Description URINE, RANDOM  Final   Special Requests NONE  Final   Culture   Final    NO GROWTH Performed at [facility], [place] [address], [place], [place] [address]    Report Status 03/24/2019 FINAL  Final  Culture,  blood (routine x 2)     Status: None (Preliminary result)   Collection Time: 03/23/19 12:00 PM   Specimen: BLOOD LEFT ARM  Result Value Ref Range Status   Specimen Description BLOOD LEFT ARM  Final   Special Requests   Final    BOTTLES DRAWN AEROBIC ONLY Blood Culture adequate volume   Culture   Final    NO GROWTH 4 DAYS Performed at [facility], [address], [place], [place] [address]    Report Status PENDING  Incomplete  Culture, blood (routine x 2)     Status: None (Preliminary result)   Collection Time: 03/23/19 12:03 PM   Specimen: BLOOD LEFT HAND  Result Value Ref Range Status   Specimen Description BLOOD LEFT HAND  Final   Special Requests   Final    BOTTLES DRAWN AEROBIC ONLY Blood Culture adequate volume   Culture   Final    NO GROWTH 4 DAYS Performed at [facility], [place] [address], [place], [place] [address]    Report Status PENDING  Incomplete  Expectorated sputum assessment w rflx to resp cult     Status: None   Collection Time: 03/23/19  2:37 PM   Specimen: Expectorated Sputum  Result Value Ref Range Status   Specimen Description EXPECTORATED SPUTUM  Final   Special Requests NONE  Final   Sputum evaluation   Final    THIS SPECIMEN IS ACCEPTABLE FOR SPUTUM CULTURE Performed at [facility], [place] [address], [place], [place] [address]    Report Status 03/23/2019 FINAL  Final  Culture, respiratory     Status: None   Collection Time: 03/23/19  2:37 PM  Result Value Ref Range Status   Specimen Description EXPECTORATED SPUTUM  Final   Special Requests NONE Reflexed from [id number]  Final   Gram Stain   Final    FEW WBC PRESENT,BOTH PMN AND MONONUCLEAR FEW GRAM POSITIVE COCCI IN PAIRS IN CLUSTERS FEW YEAST     Culture MODERATE VANCOMYCIN RESISTANT ENTEROCOCCUS  Final   Report Status 03/28/2019 FINAL  Final   Organism ID, Bacteria VANCOMYCIN RESISTANT ENTEROCOCCUS  Final      Susceptibility   Vancomycin resistant enterococcus - MIC*    AMPICILLIN <=2 SENSITIVE Sensitive     VANCOMYCIN >=32 RESISTANT Resistant     GENTAMICIN SYNERGY SENSITIVE Sensitive     LINEZOLID 2 SENSITIVE Sensitive     * MODERATE VANCOMYCIN RESISTANT ENTEROCOCCUS  Fungus Culture With Stain     Status: Abnormal (Preliminary result)   Collection Time: 03/25/19 12:07 PM   Specimen: Peritoneal  Result Value Ref Range Status   Fungus Stain Final report (A)  Final    Comment: (NOTE) Performed At: [facility] [place], [place] [id number] Rush Farmer MD [id number]    Fungus (Mycology) Culture PENDING  Incomplete   Fungal Source PERITONEAL  Final    Comment: Performed at [facility], [place] [address], [place], [place] [address]  Fungus Culture Result     Status: Abnormal   Collection Time: 03/25/19 12:07 PM  Result Value Ref Range Status   Result 1 Yeast observed (A)  Final    Comment: (NOTE) POSITIVE SMEAR REPORTED TO DORQUITA VANHOOK AT [id number] ON 03/28/19 BY AM. Performed At: [facility] [place], [place] [id number] Rush Farmer MD [id number]   Body fluid culture     Status: None (Preliminary result)   Collection Time: 03/25/19  6:40 PM  Specimen: Body Fluid  Result Value Ref Range Status   Specimen Description FLUID PERITONEAL  Final   Special Requests NONE  Final   Gram Stain   Final    NO WBC SEEN FEW BUDDING YEAST SEEN FEW GRAM NEGATIVE RODS FEW GRAM POSITIVE RODS Performed at [facility], [place] [address] [place], [place] [address]    Culture   Final    ABUNDANT KLEBSIELLA PNEUMONIAE ABUNDANT PROVIDENCIA STUARTII    Report Status PENDING  Incomplete   Organism ID, Bacteria KLEBSIELLA PNEUMONIAE  Final   Organism ID, Bacteria  PROVIDENCIA STUARTII  Final      Susceptibility   Klebsiella pneumoniae - MIC*    AMPICILLIN >=32 RESISTANT Resistant     CEFAZOLIN <=4 SENSITIVE Sensitive     CEFEPIME <=0.12 SENSITIVE Sensitive     CEFTAZIDIME <=1 SENSITIVE Sensitive     CEFTRIAXONE <=0.25 SENSITIVE Sensitive     CIPROFLOXACIN <=0.25 SENSITIVE Sensitive     GENTAMICIN <=1 SENSITIVE Sensitive     IMIPENEM <=0.25 SENSITIVE Sensitive     TRIMETH/SULFA <=20 SENSITIVE Sensitive     AMPICILLIN/SULBACTAM >=32 RESISTANT Resistant     PIP/TAZO 64 INTERMEDIATE Intermediate     * ABUNDANT KLEBSIELLA PNEUMONIAE   Providencia stuartii - MIC*    AMPICILLIN RESISTANT Resistant     CEFAZOLIN >=64 RESISTANT Resistant     CEFEPIME <=0.12 SENSITIVE Sensitive     CEFTAZIDIME <=1 SENSITIVE Sensitive     CEFTRIAXONE <=0.25 SENSITIVE Sensitive     CIPROFLOXACIN 0.5 SENSITIVE Sensitive     GENTAMICIN RESISTANT Resistant     IMIPENEM 1 SENSITIVE Sensitive     TRIMETH/SULFA <=20 SENSITIVE Sensitive     AMPICILLIN/SULBACTAM 4 SENSITIVE Sensitive     PIP/TAZO <=4 SENSITIVE Sensitive     * ABUNDANT PROVIDENCIA STUARTII         Radiology Studies: DG Chest Port 1 View  Result Date: 03/26/2019 CLINICAL DATA:  PICC line placement EXAM: PORTABLE CHEST 1 VIEW COMPARISON:  03/22/2019 FINDINGS: Right PICC line is in place with the tip in the right atrium approximately 3 cm deep to the cavoatrial junction. Cardiomegaly. Low lung volumes with bibasilar atelectasis. IMPRESSION: Right PICC line tip in the upper right atrium approximately 3 cm deep to the cavoatrial junction. Low lung volumes with bibasilar atelectasis. Electronically Signed   By: Rolm Baptise M.D.   On: 03/26/2019 21:00        Scheduled Meds: Please see MAR   Yaakov Guthrie, MD 03/28/2019, 12:40 PM

## 2019-03-29 LAB — SODIUM: Sodium: 131 mmol/L — ABNORMAL LOW (ref 135–145)

## 2019-03-30 LAB — CULTURE, BLOOD (ROUTINE X 2)
Culture: NO GROWTH
Culture: NO GROWTH
Special Requests: ADEQUATE
Special Requests: ADEQUATE

## 2019-03-30 LAB — MAGNESIUM: Magnesium: 1.6 mg/dL — ABNORMAL LOW (ref 1.7–2.4)

## 2019-03-30 LAB — PHOSPHORUS: Phosphorus: 3.3 mg/dL (ref 2.5–4.6)

## 2019-03-31 LAB — BASIC METABOLIC PANEL
Anion gap: 8 (ref 5–15)
BUN: 14 mg/dL (ref 6–20)
CO2: 22 mmol/L (ref 22–32)
Calcium: 8.1 mg/dL — ABNORMAL LOW (ref 8.9–10.3)
Chloride: 105 mmol/L (ref 98–111)
Creatinine, Ser: 0.43 mg/dL — ABNORMAL LOW (ref 0.44–1.00)
GFR calc Af Amer: >60 mL/min (ref >60)
GFR calc non Af Amer: >60 mL/min (ref >60)
Glucose, Bld: 206 mg/dL — ABNORMAL HIGH (ref 70–99)
Potassium: 3.7 mmol/L (ref 3.5–5.1)
Sodium: 135 mmol/L (ref 135–145)

## 2019-03-31 LAB — CBC
HCT: 23.7 % — ABNORMAL LOW (ref 36.0–46.0)
Hemoglobin: 7.5 g/dL — ABNORMAL LOW (ref 12.0–15.0)
MCH: 28.6 pg (ref 26.0–34.0)
MCHC: 31.6 g/dL (ref 30.0–36.0)
MCV: 90.5 fL (ref 80.0–100.0)
Platelets: 523 10*3/uL — ABNORMAL HIGH (ref 150–400)
RBC: 2.62 MIL/uL — ABNORMAL LOW (ref 3.87–5.11)
RDW: 14.8 % (ref 11.5–15.5)
WBC: 7.6 10*3/uL (ref 4.0–10.5)
nRBC: 0 % (ref 0.0–0.2)

## 2019-03-31 LAB — MAGNESIUM
Magnesium: 1.8 mg/dL (ref 1.7–2.4)
Magnesium: 1.7 mg/dL (ref 1.7–2.4)

## 2019-03-31 LAB — PHOSPHORUS: Phosphorus: 3.2 mg/dL (ref 2.5–4.6)

## 2019-04-02 LAB — MAGNESIUM
Magnesium: 1.5 mg/dL — ABNORMAL LOW (ref 1.7–2.4)
Magnesium: 1.9 mg/dL (ref 1.7–2.4)

## 2019-04-02 LAB — PHOSPHORUS: Phosphorus: 3.5 mg/dL (ref 2.5–4.6)

## 2019-04-03 LAB — TRIGLYCERIDES: Triglycerides: 110 mg/dL (ref <150)

## 2019-04-03 NOTE — Progress Notes (Signed)
PROGRESS NOTE    Gina [name]  [id number] DOB: 31-Aug-1960 DOA: 03/10/2019   Brief Narrative:  Gina [name] an 59 y.o.femalewith poorly controlledDM2 with diabetic foot wounds s/p b/l BKA, recent left phalanx amputation, HTN and schizophrenia whowas admitted on 11/22/2020when shepresented to the ED via EMS with altered mental status, nausea, vomiting and abdominal pain.In the ED, she was hypothermic and severely hypotensive. Initial blood glucose >1000. 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  She was initially on pressors for septic shock and insulin infusion per DKA protocol. She received vancomycin, cefepime, metronidazole and fluconazole.She was treated with broad-spectrum antibiotics, pressors, fluids, and general surgery took her for an ex lap on 11/23 with findings of perforated prepyloric ulcer which was repaired, she was also found to have contaminated peritoneal fluid which was drained. She was extubated 11/29. Her hospital course complicated by DKA, acute kidney injury, acute blood loss anemia, thrombocytopenia, intra-abdominal infection as well as bacteremia and fungemia. Blood culturesshowedCandida glabrata, urine culture showed Klebsiella and peritoneal fluid showed Candida albicans. Ophthalmology was also consulted and there is no evidence of fungal endophthalmitis. CT of the abdomen done on 12/15 showed persistent abdominal collection/leak. IR placed left lower quadrant anterior peritoneal drain on 12/16. On 12/21, she has had multiple bloody bowel movements including bloody output from JP drain. CT angiogram did not show any active bleeding but showed persistent large complex anterior peritoneal fluid collection. GI was consulted and evaluated patient. She was on treatment with Unasyn, Bactrim, anidulafungin until  03/17/2019. After completion of antibiotics, she had fevers and continued to complain of abdominal pain.  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   Fluid culture showed Klebsiella pneumonia, Providencia stuartii, Candida glabrata.  She is currently on treatment with IV ceftriaxone, Flagyl, fluconazole. Per primary team she has been seen by Dr. Noberto Retort of surgery.   Assessment & Plan: Intra-abdominal abscess  Perforated gastric ulcer with acute peritonitis Systemic inflammatory response syndrome Sepsis with shock (shock resolved) Candidemia UTI with Klebsiella Postoperative abdominal wound Sacrococcygeal pressure ulcer unstageable Diabetes mellitus, uncontrolled Severe peripheral vascular disease with bilateral BKA Right pleural effusion Anemia Hypertension Protein calorie malnutrition Dysphagia History of schizophrenia  Perforated gastric ulcer with acute peritonitis/intra-abdominal abscess:She is s/pexploratory laparotomy with oversew of duodenal ulcer with [name] patch on 11/23 for perforated prepyloric ulcer with gross contamination. Blood cultures at the outside facilityshowedCandida glabrata, urine culture showed Klebsiella and peritoneal fluid showed Candida albicans for which she completed treatment with antibiotics. IRplacedleft lower quadrant anterior peritoneal drain catheter on 12/16. Cultures at that time showed persistent Candida glabrata, Enterococcus faecalis, stenotrophomonas. Completed treatment with Unasyn, Bactrim, anidulafungin January 4.  After completion of antibiotics she started  having fever. CT done on 03/25/2019 per report continues to show persistent intraperitoneal gas and fluid collection with pigtail catheter in the collection.  Fluid cultures showed Klebsiella, Providentia, moderate Candida glabrata.    Continue treatment with ceftriaxone, Flagyl, fluconazole.  Recommend to repeat CT scan in the next 4 to 5 days. If she has persistent fluid collection on the repeat imaging despite antimicrobial therapy then  we may need to consider reconsulting surgery.  Per primary team patient was seen by Dr. Noberto Retort from Surgery.  SIRS: Likely secondary to the intra-abdominal abscess.  Previously also had UTI.  On antimicrobials as mentioned above.  Patient is at high risk for sepsis.  If she starts having any worsening fevers or worsening leukocytosis would recommend to send for repeat pancultures.    Sepsis with shock: This was at the outside facility. Currently shock is resolved. On antimicrobials as mentioned above. However, she is at very high risk for recurrent sepsis. If she starts having any worsening fevers, leukocytosis then would recommend to send for repeat cultures.  Candidemia: Patient blood cultures at the outside facility showed Candida albicans. This was likely secondary to the perforated ulcer and the peritonitis. Antimicrobials as mentioned above. Again, if she starts having any worsening fevers, worsening leukocytosis would recommend to repeat blood cultures.  UTI: Patient was found to have UTI with Klebsiella at the outside facility. Antimicrobials as mentioned above.  Again, if she starts having any worsening fevers or leukocytosis would recommend to send for repeat pancultures including UA and urine cultures.  Sacrococcygeal pressure ulcer: Unstageable: On antimicrobials as mentioned above. Continue local wound care. Due to her debility she is very high risk for worsening. If the pressure ulcer worsens further consider surgery consult.  Diabetes  mellitus: She has uncontrolled diabetes mellitus and was in DKA at the outside facility on IV insulin drip.  At this time DKA resolved.Continue to monitor Accu-Cheks, management per the primary team.  Right pleural effusion: Patient is status post thoracentesis. Respiratory status appears to be stable at this time. Continue to monitor.  Anemia: She required a transfusion at the outside facility. There was some concern about GI bleed however CT angio did not show any source of bleeding. Continue to monitor hemoglobin. Further management per the primary team.  Severe peripheral vascular disease with bilateral BKA: Patient while admitted at [facility] noted to have some worsening of her bilateral BKA likely secondary to ischemia from the septic shock. Currently has dressing in place. Continue local wound care. If any further worsening, she will need to be evaluated for AKA once her acute issues resolve.  Protein calorie malnutrition: On TPN. Due to her being on TPN she is high risk for fungemia.  Dysphagia: Due to her dysphagia she is high risk for aspiration and worsening respiratory failure secondary to aspiration pneumonia.  She is currently n.p.o.  at this time.  History of schizophrenia: Continue medication and management per primary team.  Recent acute renal failure: At this time appears to be stable. Please monitor BUN/trending closely especially while on antibiotics.  Plan of care discussed with primary attending.  Due to her complex medical problems she is high risk for worsening and decompensation.  Subjective: No acute events.  Complaining of abdominal pain which she says is chronic.  She is anxious and wanting to know when she will be discharged.  Objective: Vitals: Temperature 97.9, pulse 98, respiratory rate 18, blood pressure 140/62, oxygen saturation 99%. Examination: General exam: Awake and oriented, anxious, wanting to go home, not in any acute distress at this  time Head: Atraumatic normocephalic Eyes: PERLA, EOMI, irises appear normal, anicteric sclera,  ENMT: external ears and nose appear normal,normal hearing,lips appears normal Neck:Supple, no masses CVS: S1-S2 clear, murmur Respiratory:Decreased breath sound lower lobes otherwise clear to auscultation Abdomen:Has dressing in place,drain, ostomy, positive bowel sounds Musculoskeletal:Bilateral lower extremity BKA with dressing in place Neuro:She is awake  and oriented, has debility with generalized weakness Psych:stable mood and affect, mental status Skin: no rashes,sacrococcygeal pressure ulcer unstageable, right ischium deep tissue pressure injury, right trochanter deep tissue pressure injury, left ischium deep tissue pressure injury      Data Reviewed: I have personally reviewed following labs and imaging studies  CBC: Recent Labs  Lab 03/31/19 0531  WBC 7.6  HGB 7.5*  HCT 23.7*  MCV 90.5  PLT 0000000*   Basic Metabolic Panel: Recent Labs  Lab 03/28/19 0401 03/28/19 1845 03/29/19 0647 03/30/19 0527 03/31/19 0531 03/31/19 1800 04/02/19 0433 04/02/19 1855  NA 131*  --  131*  --  135  --   --   --   K 3.6  --   --   --  3.7  --   --   --   CL 103  --   --   --  105  --   --   --   CO2 20*  --   --   --  22  --   --   --   GLUCOSE 184*  --   --   --  206*  --   --   --   BUN 10  --   --   --  14  --   --   --   CREATININE 0.61  --   --   --  0.43*  --   --   --   CALCIUM 7.9*  --   --   --  8.1*  --   --   --   MG  --    < >  --  1.6* 1.8 1.7 1.5* 1.9  PHOS 4.0  --   --  3.3 3.2  --  3.5  --    < > = values in this interval not displayed.   GFR: CrCl cannot be calculated (Unknown ideal weight.). Liver Function Tests: Recent Labs  Lab 03/28/19 0401  AST 14*  ALT 14  ALKPHOS 213*  BILITOT 0.7  PROT 5.8*  ALBUMIN 1.2*   No results for input(s): LIPASE, AMYLASE in the last 168 hours. No results for input(s): AMMONIA in the last 168 hours. Coagulation  Profile: No results for input(s): INR, PROTIME in the last 168 hours. Cardiac Enzymes: No results for input(s): CKTOTAL, CKMB, CKMBINDEX, TROPONINI in the last 168 hours. BNP (last 3 results) No results for input(s): PROBNP in the last 8760 hours. HbA1C: No results for input(s): HGBA1C in the last 72 hours. CBG: No results for input(s): GLUCAP in the last 168 hours. Lipid Profile: Recent Labs    04/03/19 0506  TRIG 110   Thyroid Function Tests: No results for input(s): TSH, T4TOTAL, FREET4, T3FREE, THYROIDAB in the last 72 hours. Anemia Panel: No results for input(s): VITAMINB12, FOLATE, FERRITIN, TIBC, IRON, RETICCTPCT in the last 72 hours. Sepsis Labs: No results for input(s): PROCALCITON, LATICACIDVEN in the last 168 hours.  Recent Results (from the past 240 hour(s))  Fungus Culture With Stain     Status: Abnormal   Collection Time: 03/25/19 12:07 PM   Specimen: Peritoneal  Result Value Ref Range Status   Fungus Stain Final report (A)  Final   Fungus (Mycology) Culture Preliminary report (A)  Final    Comment: (NOTE) Performed At: [facility] [place], [place] [id number] Rush Farmer MD [id number]    Fungal Source PERITONEAL  Final    Comment: Performed at [facility],  [address] [place], [place] [address]  Fungus Culture Result     Status: Abnormal   Collection Time: 03/25/19 12:07 PM  Result Value Ref Range Status   Result 1 Yeast observed (A)  Final    Comment: (NOTE) POSITIVE SMEAR REPORTED TO DORQUITA VANHOOK AT [id number] ON 03/28/19 BY AM. Performed At: [facility] [address] [place], [place] [id number] Rush Farmer MD [id number]   Fungal organism reflex     Status: Abnormal   Collection Time: 03/25/19 12:07 PM  Result Value Ref Range Status   Fungal result 1 Comment (A)  Final    Comment: (NOTE) Yeast isolated, identification in progress. Preliminary fungus culture reported to [employer] on  03/31/2019 at 14:50. Faxed to [phone]. [facility] Performed At: [facility] [place], [place] [id number] Rush Farmer MD [id number]   Body fluid culture     Status: None   Collection Time: 03/25/19  6:40 PM   Specimen: Body Fluid  Result Value Ref Range Status   Specimen Description FLUID PERITONEAL  Final   Special Requests NONE  Final   Gram Stain   Final    NO WBC SEEN FEW BUDDING YEAST SEEN FEW GRAM NEGATIVE RODS FEW GRAM POSITIVE RODS Performed at [facility], [place] [address] [place],  [address]    Culture   Final    ABUNDANT KLEBSIELLA PNEUMONIAE ABUNDANT PROVIDENCIA STUARTII MODERATE CANDIDA GLABRATA    Report Status 03/28/2019 FINAL  Final   Organism ID, Bacteria KLEBSIELLA PNEUMONIAE  Final   Organism ID, Bacteria PROVIDENCIA STUARTII  Final      Susceptibility   Klebsiella pneumoniae - MIC*    AMPICILLIN >=32 RESISTANT Resistant     CEFAZOLIN <=4 SENSITIVE Sensitive     CEFEPIME <=0.12 SENSITIVE Sensitive     CEFTAZIDIME <=1 SENSITIVE Sensitive     CEFTRIAXONE <=0.25 SENSITIVE Sensitive     CIPROFLOXACIN <=0.25 SENSITIVE Sensitive     GENTAMICIN <=1 SENSITIVE Sensitive     IMIPENEM <=0.25 SENSITIVE Sensitive     TRIMETH/SULFA <=20 SENSITIVE Sensitive     AMPICILLIN/SULBACTAM >=32 RESISTANT Resistant     PIP/TAZO 64 INTERMEDIATE Intermediate     * ABUNDANT KLEBSIELLA PNEUMONIAE   Providencia stuartii - MIC*    AMPICILLIN RESISTANT Resistant     CEFAZOLIN >=64 RESISTANT Resistant     CEFEPIME <=0.12 SENSITIVE Sensitive     CEFTAZIDIME <=1 SENSITIVE Sensitive     CEFTRIAXONE <=0.25 SENSITIVE Sensitive     CIPROFLOXACIN 0.5 SENSITIVE Sensitive     GENTAMICIN RESISTANT Resistant     IMIPENEM 1 SENSITIVE Sensitive     TRIMETH/SULFA <=20 SENSITIVE Sensitive     AMPICILLIN/SULBACTAM 4 SENSITIVE Sensitive     PIP/TAZO <=4 SENSITIVE Sensitive     * ABUNDANT PROVIDENCIA STUARTII         Radiology Studies: No results  found.   Scheduled Meds: Please see MAR   Orilla Templeman MD  04/03/2019, 2:21 PM

## 2019-04-04 LAB — TRIGLYCERIDES: Triglycerides: 129 mg/dL (ref <150)

## 2019-04-04 LAB — BASIC METABOLIC PANEL
Anion gap: 7 (ref 5–15)
BUN: 17 mg/dL (ref 6–20)
CO2: 21 mmol/L — ABNORMAL LOW (ref 22–32)
Calcium: 8.1 mg/dL — ABNORMAL LOW (ref 8.9–10.3)
Chloride: 102 mmol/L (ref 98–111)
Creatinine, Ser: 0.41 mg/dL — ABNORMAL LOW (ref 0.44–1.00)
GFR calc Af Amer: >60 mL/min (ref >60)
GFR calc non Af Amer: >60 mL/min (ref >60)
Glucose, Bld: 194 mg/dL — ABNORMAL HIGH (ref 70–99)
Potassium: 4.1 mmol/L (ref 3.5–5.1)
Sodium: 130 mmol/L — ABNORMAL LOW (ref 135–145)

## 2019-04-04 LAB — CBC
HCT: 21.7 % — ABNORMAL LOW (ref 36.0–46.0)
Hemoglobin: 7 g/dL — ABNORMAL LOW (ref 12.0–15.0)
MCH: 29 pg (ref 26.0–34.0)
MCHC: 32.3 g/dL (ref 30.0–36.0)
MCV: 90 fL (ref 80.0–100.0)
Platelets: 437 10*3/uL — ABNORMAL HIGH (ref 150–400)
RBC: 2.41 MIL/uL — ABNORMAL LOW (ref 3.87–5.11)
RDW: 15.4 % (ref 11.5–15.5)
WBC: 6.4 10*3/uL (ref 4.0–10.5)
nRBC: 0.3 % — ABNORMAL HIGH (ref 0.0–0.2)

## 2019-04-04 LAB — PREPARE RBC (CROSSMATCH)

## 2019-04-04 LAB — MAGNESIUM: Magnesium: 1.5 mg/dL — ABNORMAL LOW (ref 1.7–2.4)

## 2019-04-04 LAB — PHOSPHORUS: Phosphorus: 3.9 mg/dL (ref 2.5–4.6)

## 2019-04-05 LAB — TYPE AND SCREEN
ABO/RH(D): O POS
Antibody Screen: NEGATIVE
Unit division: 0

## 2019-04-05 LAB — BPAM RBC
Blood Product Expiration Date: 202102222359
ISSUE DATE / TIME: 202101221345
Unit Type and Rh: 5100

## 2019-04-05 LAB — PHOSPHORUS: Phosphorus: 3.8 mg/dL (ref 2.5–4.6)

## 2019-04-05 LAB — HEMOGLOBIN AND HEMATOCRIT, BLOOD
HCT: 27.5 % — ABNORMAL LOW (ref 36.0–46.0)
Hemoglobin: 8.9 g/dL — ABNORMAL LOW (ref 12.0–15.0)

## 2019-04-05 LAB — MAGNESIUM: Magnesium: 1.4 mg/dL — ABNORMAL LOW (ref 1.7–2.4)

## 2019-04-06 LAB — BASIC METABOLIC PANEL
Anion gap: 7 (ref 5–15)
BUN: 21 mg/dL — ABNORMAL HIGH (ref 6–20)
CO2: 24 mmol/L (ref 22–32)
Calcium: 8.6 mg/dL — ABNORMAL LOW (ref 8.9–10.3)
Chloride: 98 mmol/L (ref 98–111)
Creatinine, Ser: 0.34 mg/dL — ABNORMAL LOW (ref 0.44–1.00)
GFR calc Af Amer: >60 mL/min (ref >60)
GFR calc non Af Amer: >60 mL/min (ref >60)
Glucose, Bld: 169 mg/dL — ABNORMAL HIGH (ref 70–99)
Potassium: 4.4 mmol/L (ref 3.5–5.1)
Sodium: 129 mmol/L — ABNORMAL LOW (ref 135–145)

## 2019-04-06 LAB — TROPONIN I (HIGH SENSITIVITY)
Troponin I (High Sensitivity): 8 ng/L (ref <18)
Troponin I (High Sensitivity): 8 ng/L (ref <18)

## 2019-04-06 LAB — MAGNESIUM: Magnesium: 2 mg/dL (ref 1.7–2.4)

## 2019-04-07 LAB — CBC
HCT: 26.8 % — ABNORMAL LOW (ref 36.0–46.0)
Hemoglobin: 8.6 g/dL — ABNORMAL LOW (ref 12.0–15.0)
MCH: 29.1 pg (ref 26.0–34.0)
MCHC: 32.1 g/dL (ref 30.0–36.0)
MCV: 90.5 fL (ref 80.0–100.0)
Platelets: 380 10*3/uL (ref 150–400)
RBC: 2.96 MIL/uL — ABNORMAL LOW (ref 3.87–5.11)
RDW: 14.9 % (ref 11.5–15.5)
WBC: 6.6 10*3/uL (ref 4.0–10.5)
nRBC: 0 % (ref 0.0–0.2)

## 2019-04-07 LAB — MAGNESIUM: Magnesium: 1.5 mg/dL — ABNORMAL LOW (ref 1.7–2.4)

## 2019-04-07 LAB — PHOSPHORUS: Phosphorus: 4.5 mg/dL (ref 2.5–4.6)

## 2019-04-08 LAB — BASIC METABOLIC PANEL
Anion gap: 7 (ref 5–15)
BUN: 26 mg/dL — ABNORMAL HIGH (ref 6–20)
CO2: 25 mmol/L (ref 22–32)
Calcium: 8.7 mg/dL — ABNORMAL LOW (ref 8.9–10.3)
Chloride: 101 mmol/L (ref 98–111)
Creatinine, Ser: 0.55 mg/dL (ref 0.44–1.00)
GFR calc Af Amer: >60 mL/min (ref >60)
GFR calc non Af Amer: >60 mL/min (ref >60)
Glucose, Bld: 143 mg/dL — ABNORMAL HIGH (ref 70–99)
Potassium: 4.5 mmol/L (ref 3.5–5.1)
Sodium: 133 mmol/L — ABNORMAL LOW (ref 135–145)

## 2019-04-08 LAB — MAGNESIUM: Magnesium: 1.5 mg/dL — ABNORMAL LOW (ref 1.7–2.4)

## 2019-04-08 LAB — PHOSPHORUS: Phosphorus: 4.1 mg/dL (ref 2.5–4.6)

## 2019-04-09 LAB — MAGNESIUM: Magnesium: 1.7 mg/dL (ref 1.7–2.4)

## 2019-04-09 LAB — PHOSPHORUS: Phosphorus: 4.2 mg/dL (ref 2.5–4.6)

## 2019-04-10 ENCOUNTER — Other Ambulatory Visit (HOSPITAL_COMMUNITY): Payer: Self-pay

## 2019-04-10 LAB — MAGNESIUM: Magnesium: 1.9 mg/dL (ref 1.7–2.4)

## 2019-04-10 LAB — TRIGLYCERIDES: Triglycerides: 362 mg/dL — ABNORMAL HIGH (ref <150)

## 2019-04-10 NOTE — Progress Notes (Signed)
PROGRESS NOTE    Gina [name]  [id number] DOB: Sep 02, 1960 DOA: 03/10/2019   Brief Narrative:  Gina [name] an 59 y.o.femalewith poorly controlledDM2 with diabetic foot wounds s/p b/l BKA, recent left phalanx amputation, HTN and schizophrenia whowas admitted to acute hospital on 11/22/2020when shepresented to the ED via EMS with altered mental status, nausea, vomiting and abdominal pain.In the ED, she was hypothermic and severely hypotensive. Initial blood glucose >1000. 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  She was initially on pressors for septic shock and insulin infusion per DKA protocol. She received vancomycin, cefepime, metronidazole and fluconazole.Shewas treated withbroad-spectrum antibiotics, pressors, fluids, and general surgery took her for an ex lap on 11/23 with findings of perforated prepyloric ulcer which was repaired, she was also found to have contaminated peritoneal fluid which was drained. She was extubated 11/29. Her hospital course complicated by DKA, acute kidney injury, acute blood loss anemia, thrombocytopenia, intra-abdominal infection as well as bacteremia and fungemia. Blood culturesshowedCandida glabrata, urine culture showed Klebsiella and peritoneal fluid showed Candida albicans. Ophthalmology was also consulted and there is no evidence of fungal endophthalmitis. CT of the abdomen done on 12/15 showed persistent abdominal collection/leak. IR placed left lower quadrant anterior peritoneal drain on 12/16. On 12/21, she has had multiple bloody bowel movements including bloody output from JP drain. CT angiogram did not show any active bleeding but showed persistent large complex anterior peritoneal fluid collection. GI was consulted and evaluated patient. She wason treatment with Unasyn, Bactrim,  anidulafungin until 03/17/2019.After completion of antibiotics,she had fevers and continued to complain of abdominal pain. 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  Fluid culture showed Klebsiella pneumonia, Providencia stuartii, Candida glabrata.  She is currently on treatment with IV ceftriaxone, Flagyl, fluconazole. She has been seen by surgery.  At this time she denies having any major complaints.   Assessment & Plan:  Intra-abdominal abscess Perforated gastric ulcer with acute peritonitis Systemic inflammatory response syndrome Sepsis with shock (shock resolved) Candidemia UTI with Klebsiella Postoperative abdominal wound Sacrococcygeal pressure ulcer unstageable Diabetes mellitus, uncontrolled Severe peripheral vascular disease with bilateral BKA Right pleural effusion Anemia Hypertension Protein calorie malnutrition Dysphagia History of schizophrenia  Perforated gastric ulcer with acute peritonitis/intra-abdominal abscess:She is s/pexploratory laparotomy with oversew of duodenal ulcer with [name] patch on 02/03/19 for perforated prepyloric ulcer with gross contamination. Blood cultures at the outside facilityshowedCandida glabrata, urine culture showed Klebsiella and peritoneal fluid showed Candida albicans for which she completed treatment with antibiotics. IRplacedleft lower quadrant anterior peritoneal drain catheter on 12/16. Cultures at that time showed persistent Candida glabrata, Enterococcus faecalis, stenotrophomonas.Completedtreatment with Unasyn, Bactrim, anidulafungin March 17, 2019.  After completion of antibiotics she started having fever. CT done on 03/25/2019 per report continues to show persistent intraperitoneal gas and fluid collection with pigtail catheter in the collection.Fluid cultures showed Klebsiella, Providentia,moderate Candida glabrata.  04/10/2019: She is currently on  treatment with ceftriaxone, Flagyl, fluconazole.  Recommend to repeat CT scan. If she has persistent fluid collectionon the repeat imagingdespite antimicrobial therapy then we may need to consider reconsulting surgery. Per primary team patient  was already seen by Dr. Noberto Retort from Surgery.  SIRS:Likely secondary to the intra-abdominal abscess. Previously also had UTI. On antimicrobials as mentioned above. Patient is at high risk for sepsis.  She is afebrile at this time. If she starts having any worsening fevers or worsening leukocytosis would recommend to send for repeat pancultures.   Sepsis with shock: This was at the outside facility. Currently shock is resolved. On antimicrobials as mentioned above. However, she is at very high risk for recurrent sepsis. If she starts having any worsening fevers, leukocytosis then would recommend to send for repeat cultures.  Candidemia: Patient blood cultures at the outside facility showed Candida albicans. This was likely secondary to the perforated ulcer and the peritonitis. Antimicrobials as mentioned above. If she starts having any worsening fevers, worsening leukocytosis would recommend to repeat blood cultures.  UTI: Patient was found to have UTI with Klebsiella at the outside facility. Antimicrobials as mentioned above.In case she has any worsening fevers or leukocytosis would recommend to send for repeat pancultures including UA and urine cultures.  Sacrococcygeal pressure ulcer: Unstageable: On antimicrobials as mentioned above. Continue local wound care. Due to her debility she is very high risk for worsening. If  thepressureulcer worsens further consider surgery consult.  Diabetes mellitus: She has uncontrolled diabetes mellitus.At this time DKA resolved.Continue to monitor Accu-Cheks, management per the primary team.  Right pleural effusion: Patient is status post thoracentesis. Respiratory status appears to be stable at this time. Continue to monitor.  Anemia: She required a transfusion at the outside facility. There was some concern about GI bleed however CT angio did not show any source of bleeding. Continue to monitor hemoglobin. Further management per the primary team.  Severe peripheral vascular disease with bilateral BKA: Patient while admitted at [facility] noted to have some worsening of her bilateral BKA likely secondary to ischemia from the septic shock. Currently has dressing in place. Continue local wound care. If any further worsening, she will need to be evaluated for AKA once her acute issues resolve.  Protein calorie malnutrition: On TPN. Due to her being on TPN she is high risk for fungemia.  Dysphagia: Due to her dysphagia she is high risk for aspiration and worsening respiratory failure secondary to aspiration pneumonia.    She is n.p.o. on TPN.  History of schizophrenia: Continue medication and management per primary team.  Recent acute renal failure: At this time appears to be stable. Please monitor BUN/trending closely especially while on antibiotics.  Plan of care discussed with primary attending. Due to her complex medical problems she is high risk for worsening and decompensation.   Subjective: She is afebrile.  Denies having any major complaints at this time.  Objective: Vitals: Temperature 98.4, pulse 79, respiratory rate 20, blood pressure 163/90, pulse oximetry 100% on room air  Examination: General exam: Awake and oriented, anxious, not in any acute distress at this time Head: Atraumatic normocephalic Eyes: PERLA, EOMI, irises appear normal,  anicteric sclera,  ENMT: external ears and nose appear normal,normal hearing,lips appears normal Neck:Supple, no masses CVS: S1-S2 clear, murmur Respiratory:Decreased breath sound lower lobes otherwise clear to auscultation Abdomen:Has dressing in place,drain,ostomy,positive bowel sounds Musculoskeletal:Bilateral lower extremity BKA with dressing in place Neuro:She is awake and oriented, has debility with generalized weakness Psych:stable mood and affect, mental status Skin: no rashes,sacrococcygeal pressure ulcer unstageable, right ischium deep tissue pressure injury, right trochanter deep tissue pressure injury, left ischium deep tissue pressure injury    Data Reviewed: I have personally reviewed following  labs and imaging studies  CBC: Recent Labs  Lab 04/04/19 0623 04/05/19 1403 04/07/19 0526  WBC 6.4  --  6.6  HGB 7.0* 8.9* 8.6*  HCT 21.7* 27.5* 26.8*  MCV 90.0  --  90.5  PLT 437*  --  [id number]   Basic Metabolic Panel: Recent Labs  Lab 04/04/19 0623 04/04/19 1957 04/05/19 0550 04/06/19 0514 04/07/19 0526 04/07/19 1832 04/08/19 0523 04/09/19 0540 04/09/19 1123  NA 130*  --   --  129*  --   --  133*  --   --   K 4.1  --   --  4.4  --   --  4.5  --   --   CL 102  --   --  98  --   --  101  --   --   CO2 21*  --   --  24  --   --  25  --   --   GLUCOSE 194*  --   --  169*  --   --  143*  --   --   BUN 17  --   --  21*  --   --  26*  --   --   CREATININE 0.41*  --   --  0.34*  --   --  0.55  --   --   CALCIUM 8.1*  --   --  8.6*  --   --  8.7*  --   --   MG  --    < > 1.4* 2.0  --  1.5* 1.5*  --  1.7  PHOS 3.9  --  3.8  --  4.5  --  4.1 4.2  --    < > = values in this interval not displayed.   GFR: CrCl cannot be calculated (Unknown ideal weight.). Liver Function Tests: No results for input(s): AST, ALT, ALKPHOS, BILITOT, PROT, ALBUMIN in the last 168 hours. No results for input(s): LIPASE, AMYLASE in the last 168 hours. No results for input(s): AMMONIA  in the last 168 hours. Coagulation Profile: No results for input(s): INR, PROTIME in the last 168 hours. Cardiac Enzymes: No results for input(s): CKTOTAL, CKMB, CKMBINDEX, TROPONINI in the last 168 hours. BNP (last 3 results) No results for input(s): PROBNP in the last 8760 hours. HbA1C: No results for input(s): HGBA1C in the last 72 hours. CBG: No results for input(s): GLUCAP in the last 168 hours. Lipid Profile: Recent Labs    04/10/19 0448  TRIG 362*   Thyroid Function Tests: No results for input(s): TSH, T4TOTAL, FREET4, T3FREE, THYROIDAB in the last 72 hours. Anemia Panel: No results for input(s): VITAMINB12, FOLATE, FERRITIN, TIBC, IRON, RETICCTPCT in the last 72 hours. Sepsis Labs: No results for input(s): PROCALCITON, LATICACIDVEN in the last 168 hours.  No results found for this or any previous visit (from the past 240 hour(s)).       Radiology Studies: No results found.      Scheduled Meds: Please see MAR   Yaakov Guthrie, MD  04/10/2019, 2:17 PM

## 2019-04-11 ENCOUNTER — Institutional Professional Consult (permissible substitution) (HOSPITAL_COMMUNITY): Payer: Self-pay

## 2019-04-11 ENCOUNTER — Encounter: Payer: Self-pay | Admitting: Internal Medicine

## 2019-04-11 LAB — MAGNESIUM: Magnesium: 1.6 mg/dL — ABNORMAL LOW (ref 1.7–2.4)

## 2019-04-11 LAB — PHOSPHORUS: Phosphorus: 4.4 mg/dL (ref 2.5–4.6)

## 2019-04-11 MED ORDER — IOHEXOL 350 MG/ML SOLN
80.0000 mL | Freq: Once | INTRAVENOUS | Status: AC | PRN
  Administered 2019-04-11: 80 mL via INTRAVENOUS

## 2019-04-12 LAB — CBC
HCT: 28.3 % — ABNORMAL LOW (ref 36.0–46.0)
Hemoglobin: 9 g/dL — ABNORMAL LOW (ref 12.0–15.0)
MCH: 28.8 pg (ref 26.0–34.0)
MCHC: 31.8 g/dL (ref 30.0–36.0)
MCV: 90.7 fL (ref 80.0–100.0)
Platelets: 312 10*3/uL (ref 150–400)
RBC: 3.12 MIL/uL — ABNORMAL LOW (ref 3.87–5.11)
RDW: 14.6 % (ref 11.5–15.5)
WBC: 5.4 10*3/uL (ref 4.0–10.5)
nRBC: 0 % (ref 0.0–0.2)

## 2019-04-12 LAB — COMPREHENSIVE METABOLIC PANEL
ALT: 13 U/L (ref 0–44)
AST: 27 U/L (ref 15–41)
Albumin: 1.4 g/dL — ABNORMAL LOW (ref 3.5–5.0)
Alkaline Phosphatase: 199 U/L — ABNORMAL HIGH (ref 38–126)
Anion gap: 8 (ref 5–15)
BUN: 24 mg/dL — ABNORMAL HIGH (ref 6–20)
CO2: 24 mmol/L (ref 22–32)
Calcium: 8.5 mg/dL — ABNORMAL LOW (ref 8.9–10.3)
Chloride: 99 mmol/L (ref 98–111)
Creatinine, Ser: 0.47 mg/dL (ref 0.44–1.00)
GFR calc Af Amer: >60 mL/min (ref >60)
GFR calc non Af Amer: >60 mL/min (ref >60)
Glucose, Bld: 133 mg/dL — ABNORMAL HIGH (ref 70–99)
Potassium: 4.3 mmol/L (ref 3.5–5.1)
Sodium: 131 mmol/L — ABNORMAL LOW (ref 135–145)
Total Bilirubin: 0.2 mg/dL — ABNORMAL LOW (ref 0.3–1.2)
Total Protein: 6.4 g/dL — ABNORMAL LOW (ref 6.5–8.1)

## 2019-04-12 LAB — MAGNESIUM: Magnesium: 1.8 mg/dL (ref 1.7–2.4)

## 2019-04-14 LAB — CBC
HCT: 30 % — ABNORMAL LOW (ref 36.0–46.0)
Hemoglobin: 9.5 g/dL — ABNORMAL LOW (ref 12.0–15.0)
MCH: 28.9 pg (ref 26.0–34.0)
MCHC: 31.7 g/dL (ref 30.0–36.0)
MCV: 91.2 fL (ref 80.0–100.0)
Platelets: 349 10*3/uL (ref 150–400)
RBC: 3.29 MIL/uL — ABNORMAL LOW (ref 3.87–5.11)
RDW: 14.8 % (ref 11.5–15.5)
WBC: 6.2 10*3/uL (ref 4.0–10.5)
nRBC: 0 % (ref 0.0–0.2)

## 2019-04-14 LAB — COMPREHENSIVE METABOLIC PANEL
ALT: 12 U/L (ref 0–44)
AST: 24 U/L (ref 15–41)
Albumin: 1.5 g/dL — ABNORMAL LOW (ref 3.5–5.0)
Alkaline Phosphatase: 186 U/L — ABNORMAL HIGH (ref 38–126)
Anion gap: 10 (ref 5–15)
BUN: 27 mg/dL — ABNORMAL HIGH (ref 6–20)
CO2: 26 mmol/L (ref 22–32)
Calcium: 9.2 mg/dL (ref 8.9–10.3)
Chloride: 98 mmol/L (ref 98–111)
Creatinine, Ser: 0.46 mg/dL (ref 0.44–1.00)
GFR calc Af Amer: >60 mL/min (ref >60)
GFR calc non Af Amer: >60 mL/min (ref >60)
Glucose, Bld: 133 mg/dL — ABNORMAL HIGH (ref 70–99)
Potassium: 4.3 mmol/L (ref 3.5–5.1)
Sodium: 134 mmol/L — ABNORMAL LOW (ref 135–145)
Total Bilirubin: 0.7 mg/dL (ref 0.3–1.2)
Total Protein: 6.6 g/dL (ref 6.5–8.1)

## 2019-04-16 LAB — BASIC METABOLIC PANEL
Anion gap: 14 (ref 5–15)
BUN: 35 mg/dL — ABNORMAL HIGH (ref 6–20)
CO2: 22 mmol/L (ref 22–32)
Calcium: 9.2 mg/dL (ref 8.9–10.3)
Chloride: 101 mmol/L (ref 98–111)
Creatinine, Ser: 0.48 mg/dL (ref 0.44–1.00)
GFR calc Af Amer: >60 mL/min (ref >60)
GFR calc non Af Amer: >60 mL/min (ref >60)
Glucose, Bld: 133 mg/dL — ABNORMAL HIGH (ref 70–99)
Potassium: 4.7 mmol/L (ref 3.5–5.1)
Sodium: 137 mmol/L (ref 135–145)

## 2019-04-16 LAB — CBC
HCT: 28.2 % — ABNORMAL LOW (ref 36.0–46.0)
Hemoglobin: 8.9 g/dL — ABNORMAL LOW (ref 12.0–15.0)
MCH: 29.3 pg (ref 26.0–34.0)
MCHC: 31.6 g/dL (ref 30.0–36.0)
MCV: 92.8 fL (ref 80.0–100.0)
Platelets: 324 10*3/uL (ref 150–400)
RBC: 3.04 MIL/uL — ABNORMAL LOW (ref 3.87–5.11)
RDW: 15.1 % (ref 11.5–15.5)
WBC: 6 10*3/uL (ref 4.0–10.5)
nRBC: 0 % (ref 0.0–0.2)

## 2019-04-16 LAB — FUNGUS CULTURE WITH STAIN

## 2019-04-16 LAB — FUNGUS CULTURE RESULT

## 2019-04-16 LAB — MAGNESIUM: Magnesium: 1.5 mg/dL — ABNORMAL LOW (ref 1.7–2.4)

## 2019-04-16 LAB — FUNGAL ORGANISM REFLEX

## 2019-04-17 LAB — TRIGLYCERIDES: Triglycerides: 132 mg/dL (ref <150)

## 2019-04-17 NOTE — Progress Notes (Signed)
PROGRESS NOTE    Gina [name]  [id number] DOB: Aug 28, 1960 DOA: 03/10/2019 PCP: [name], MD    Brief Narrative:  Gina [name] an 59 y.o.femalewith poorly controlledDM2 with diabetic foot wounds s/p b/l BKA, recent left phalanx amputation, HTN and schizophrenia whowas admitted to acute hospital on 11/22/2020when shepresented to the ED via EMS with altered mental status, nausea, vomiting and abdominal pain.In the ED, she was hypothermic and severely hypotensive. Initial blood glucose >1000. CT a/p without contrast showed findings consistent with hollow viscus perforation and extensive pneumatosis involving multiple loops of small bowel concerning for mesenteric ischemia.She was initially on pressors for septic shock and insulin infusion per DKA protocol. She received vancomycin, cefepime, metronidazole and fluconazole.Shewas treated withbroad-spectrum antibiotics, pressors, fluids, and general surgery took her for an ex lap on 11/23 with findings of perforated prepyloric ulcer which was repaired, she was also found to have contaminated peritoneal fluid which was drained. She was extubated 11/29. Her hospital course complicated by DKA, acute kidney injury, acute blood loss anemia, thrombocytopenia, intra-abdominal infection as well as bacteremia and fungemia. Blood culturesshowedCandida glabrata, urine culture showed Klebsiella and peritoneal fluid showed Candida albicans. Ophthalmology was also consulted and there is no evidence of fungal endophthalmitis. CT of the abdomen done on 12/15 showed persistent abdominal collection/leak. IR placed left lower quadrant anterior peritoneal drain on 12/16. On 12/21, she has had multiple bloody bowel movements including bloody output from JP drain. CT angiogram did not show any active bleeding but showed persistent large complex anterior peritoneal fluid collection. GI was consulted and evaluated patient. She wason treatment with  Unasyn, Bactrim, anidulafunginuntil 03/17/2019.After completion of antibiotics,she had fevers and continued to complain of abdominal pain. 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 Fluid culture showed Klebsiella pneumonia, Providencia stuartii, Candida glabrata. She is currently on treatment with IV ceftriaxone, Flagyl, fluconazole. She has been seen by surgery.  At this time she denies having any major complaints.   Assessment & Plan:   Intra-abdominal abscess Perforated gastric ulcer with acute peritonitis Systemic inflammatory response syndrome Sepsis with shock (shock resolved) Candidemia UTI with Klebsiella Postoperative abdominal wound Sacrococcygeal pressure ulcer unstageable Diabetes mellitus, uncontrolled Severe peripheral vascular disease with bilateral BKA Right pleural effusion Anemia Hypertension Protein calorie malnutrition Dysphagia History of schizophrenia  Perforated gastric ulcer with acute peritonitis/intra-abdominal abscess:She is s/pexploratory laparotomy with oversew of duodenal ulcer with [name] patch on 02/03/19 for perforated prepyloric ulcer with gross contamination. Blood culturesat the outside facilityshowedCandida glabrata, urine culture showed Klebsiella and peritoneal fluid showed Candida albicansfor which she completed treatment with antibiotics. IRplacedleft lower quadrant anterior peritoneal drain catheter on 12/16. Culturesat that timeshowed persistent Candida glabrata, Enterococcus faecalis, stenotrophomonas.Completedtreatment with Unasyn, Bactrim,  anidulafungin March 17, 2019. After completion of antibiotics shestarted having fever. CT done on 03/25/2019 per report continues to show persistent intraperitoneal gas and fluid collection with pigtail catheter in the collection.Fluid culturesshowedKlebsiella, Providentia,moderate Candida glabrata. 04/17/2019: She is currently ontreatment withceftriaxone, Flagyl, fluconazole. Repeat CT on 04/11/2019 per report: Persistent complex gas and fluid complaining collection in the anterior abdomen somewhat decreased in size from prior now measuring 10.7 x 2.2 cm in size. Suspect persistent connection to the small bowel though  difficult to fully assess in the absence of small bowel contrast media. Continues to have the drain.  Per primary team patient was already seen by Dr. Noberto Retort from Surgery.  No plan for any further procedures at this time. -Continue ceftriaxone, fluconazole, Flagyl. We will plan to treat for another 1 week. -Per primary team patient will probably be discharged to a skilled nursing facility. -She will need a repeat CT of the abdomen and pelvis with contrast preferably in the next 10 to 14 days to evaluate. -Unfortunately due to the connection to the small bowel she is at very high risk for worsening of the abscess.  SIRS:Likely secondary to the intra-abdominal abscess. Previously also had UTI. On antimicrobials as mentioned above. Patient is at high risk for sepsis.  She is afebrile at this time. If she starts having any worsening fevers or worsening leukocytosis would recommend to send for repeat pancultures.   Sepsis with shock: This was at the outside facility. Currently shock is resolved. On antimicrobials as mentioned above. However, she is at very high risk for recurrent sepsis. If she starts having any worsening fevers, leukocytosis then would recommend to send for repeat cultures.  Candidemia: Patient blood cultures at the outside facility showed Candida  albicans. This was likely secondary to the perforated ulcer and the peritonitis. Antimicrobials as mentioned above. If she starts having any worsening fevers, worsening leukocytosis would recommend to repeat blood cultures.  UTI: Patient was found to have UTI with Klebsiella at the outside facility. Antimicrobials as mentioned above.In case she has any worsening fevers or leukocytosis would recommend to send for repeat pancultures including UA and urine cultures.  Sacrococcygeal pressure ulcer: Unstageable: On antimicrobials as mentioned above. Continue local wound care. Due to her debility she is very high risk for worsening. If thepressureulcer worsens further consider surgery consult.  Diabetes mellitus: She has uncontrolled diabetes mellitus.At this time DKA resolved.Continue to monitor Accu-Cheks, management per the primary team.  Right pleural effusion: Patient is status post thoracentesis. Respiratory status appears to be stable at this time. Continue to monitor.  Anemia: She required a transfusion at the outside facility. There was some concern about GI bleed however CT angio did not show any source of bleeding. Continue to monitor hemoglobin. Further management per the primary team.  Severe peripheral vascular disease with bilateral BKA: Patient while admitted at [facility] noted to have some worsening of her bilateral BKA likely secondary to ischemia from the septic shock. Currently has dressing in place. Continue local wound care. If any further worsening, she will need to be evaluated for AKA once her acute issues resolve.  Protein calorie malnutrition: On TPN. Due to her being on TPN she is high risk for fungemia.  Dysphagia: Due to her dysphagia she is high risk for aspiration and worsening respiratory failure secondary to aspiration pneumonia.Patient at this time is n.p.o. on TPN.  History of schizophrenia: Continue medication and management per  primary team.  Recent acute renal failure: At this time appears to be stable. Please monitor BUN/trending closely especially while on antibiotics.  Plan of care discussed with primary attending. Due to her complex medical problems she is high risk for worsening and decompensation.     Subjective: She denies having any major complaints at this time.  Afebrile.  Objective: Temperature 98.4, blood pressure 135/74, pulse 83, oxygen saturation 99% on room air  Examination: General exam:Awake and oriented, anxious, not in any acute distress at this time Head:Atraumatic normocephalic Eyes: PERLA, EOMI, irises  appear normal, anicteric sclera,  ENMT: external ears and nose appear normal,normal hearing,lips appears normal Neck:Supple, no masses CVS: S1-S2 clear, murmur Respiratory:Decreased breath sound lower lobes otherwise clear to auscultation Abdomen:Has dressing in place,drains,ostomy,positive bowel sounds Musculoskeletal:Bilateral lower extremity BKA with dressing in place Neuro:She is awake and oriented, has debility with generalized weakness Psych:stable mood and affect, mental status Skin: no rashes,sacrococcygeal pressure ulcer unstageable, right ischium deep tissue pressure injury, right trochanter deep tissue pressure injury, left ischium deep tissue pressure injury    Data Reviewed: I have personally reviewed following labs and imaging studies  CBC: Recent Labs  Lab 04/12/19 0620 04/14/19 0541 04/16/19 0714  WBC 5.4 6.2 6.0  HGB 9.0* 9.5* 8.9*  HCT 28.3* 30.0* 28.2*  MCV 90.7 91.2 92.8  PLT 312 349 0000000   Basic Metabolic Panel: Recent Labs  Lab 04/11/19 0631 04/12/19 0620 04/14/19 0541 04/16/19 0714  NA  --  131* 134* 137  K  --  4.3 4.3 4.7  CL  --  99 98 101  CO2  --  24 26 22   GLUCOSE  --  133* 133* 133*  BUN  --  24* 27* 35*  CREATININE  --  0.47 0.46 0.48  CALCIUM  --  8.5* 9.2 9.2  MG 1.6* 1.8  --  1.5*  PHOS 4.4  --   --   --     GFR: CrCl cannot be calculated (Unknown ideal weight.). Liver Function Tests: Recent Labs  Lab 04/12/19 0620 04/14/19 0541  AST 27 24  ALT 13 12  ALKPHOS 199* 186*  BILITOT 0.2* 0.7  PROT 6.4* 6.6  ALBUMIN 1.4* 1.5*   No results for input(s): LIPASE, AMYLASE in the last 168 hours. No results for input(s): AMMONIA in the last 168 hours. Coagulation Profile: No results for input(s): INR, PROTIME in the last 168 hours. Cardiac Enzymes: No results for input(s): CKTOTAL, CKMB, CKMBINDEX, TROPONINI in the last 168 hours. BNP (last 3 results) No results for input(s): PROBNP in the last 8760 hours. HbA1C: No results for input(s): HGBA1C in the last 72 hours. CBG: No results for input(s): GLUCAP in the last 168 hours. Lipid Profile: Recent Labs    04/17/19 0512  TRIG 132   Thyroid Function Tests: No results for input(s): TSH, T4TOTAL, FREET4, T3FREE, THYROIDAB in the last 72 hours. Anemia Panel: No results for input(s): VITAMINB12, FOLATE, FERRITIN, TIBC, IRON, RETICCTPCT in the last 72 hours. Sepsis Labs: No results for input(s): PROCALCITON, LATICACIDVEN in the last 168 hours.  No results found for this or any previous visit (from the past 240 hour(s)).       Radiology Studies: No results found.  Scheduled Meds: Please see MAR  Yaakov Guthrie, MD  04/17/2019, 4:53 PM

## 2019-04-19 LAB — MAGNESIUM: Magnesium: 1.5 mg/dL — ABNORMAL LOW (ref 1.7–2.4)

## 2019-04-20 LAB — MAGNESIUM: Magnesium: 2 mg/dL (ref 1.7–2.4)

## 2019-04-21 LAB — SARS CORONAVIRUS 2 (TAT 6-24 HRS): SARS Coronavirus 2: NEGATIVE

## 2019-05-21 ENCOUNTER — Inpatient Hospital Stay (HOSPITAL_COMMUNITY)
Admission: EM | Admit: 2019-05-21 | Discharge: 2019-05-21 | DRG: 864 | Disposition: A | Discharge status: Skilled Nursing Facility | Payer: Medicare Other | Source: Skilled Nursing Facility | Attending: Internal Medicine | Admitting: Internal Medicine

## 2019-05-21 ENCOUNTER — Emergency Department (HOSPITAL_COMMUNITY): Payer: Medicare Other

## 2019-05-21 DIAGNOSIS — Z833 Family history of diabetes mellitus: Secondary | ICD-10-CM | POA: Diagnosis not present

## 2019-05-21 DIAGNOSIS — D638 Anemia in other chronic diseases classified elsewhere: Secondary | ICD-10-CM | POA: Diagnosis not present

## 2019-05-21 DIAGNOSIS — Z89022 Acquired absence of left finger(s): Secondary | ICD-10-CM | POA: Diagnosis not present

## 2019-05-21 DIAGNOSIS — L97419 Non-pressure chronic ulcer of right heel and midfoot with unspecified severity: Secondary | ICD-10-CM | POA: Diagnosis present

## 2019-05-21 DIAGNOSIS — Z89512 Acquired absence of left leg below knee: Secondary | ICD-10-CM | POA: Diagnosis not present

## 2019-05-21 DIAGNOSIS — Z66 Do not resuscitate: Secondary | ICD-10-CM | POA: Diagnosis present

## 2019-05-21 DIAGNOSIS — Z91030 Bee allergy status: Secondary | ICD-10-CM

## 2019-05-21 DIAGNOSIS — Z794 Long term (current) use of insulin: Secondary | ICD-10-CM | POA: Diagnosis not present

## 2019-05-21 DIAGNOSIS — E119 Type 2 diabetes mellitus without complications: Secondary | ICD-10-CM

## 2019-05-21 DIAGNOSIS — Z89511 Acquired absence of right leg below knee: Secondary | ICD-10-CM

## 2019-05-21 DIAGNOSIS — K265 Chronic or unspecified duodenal ulcer with perforation: Secondary | ICD-10-CM

## 2019-05-21 DIAGNOSIS — Z20822 Contact with and (suspected) exposure to covid-19: Secondary | ICD-10-CM | POA: Diagnosis present

## 2019-05-21 DIAGNOSIS — I10 Essential (primary) hypertension: Secondary | ICD-10-CM

## 2019-05-21 DIAGNOSIS — K651 Peritoneal abscess: Secondary | ICD-10-CM

## 2019-05-21 DIAGNOSIS — D72829 Elevated white blood cell count, unspecified: Secondary | ICD-10-CM | POA: Diagnosis not present

## 2019-05-21 DIAGNOSIS — E43 Unspecified severe protein-calorie malnutrition: Secondary | ICD-10-CM

## 2019-05-21 DIAGNOSIS — E11628 Type 2 diabetes mellitus with other skin complications: Secondary | ICD-10-CM | POA: Diagnosis not present

## 2019-05-21 DIAGNOSIS — R188 Other ascites: Secondary | ICD-10-CM | POA: Diagnosis not present

## 2019-05-21 DIAGNOSIS — Z6821 Body mass index (BMI) 21.0-21.9, adult: Secondary | ICD-10-CM | POA: Diagnosis not present

## 2019-05-21 DIAGNOSIS — E1159 Type 2 diabetes mellitus with other circulatory complications: Secondary | ICD-10-CM

## 2019-05-21 DIAGNOSIS — Z8042 Family history of malignant neoplasm of prostate: Secondary | ICD-10-CM

## 2019-05-21 DIAGNOSIS — Z888 Allergy status to other drugs, medicaments and biological substances status: Secondary | ICD-10-CM | POA: Diagnosis not present

## 2019-05-21 DIAGNOSIS — E1165 Type 2 diabetes mellitus with hyperglycemia: Secondary | ICD-10-CM

## 2019-05-21 DIAGNOSIS — Z8249 Family history of ischemic heart disease and other diseases of the circulatory system: Secondary | ICD-10-CM | POA: Diagnosis not present

## 2019-05-21 DIAGNOSIS — N393 Stress incontinence (female) (male): Secondary | ICD-10-CM | POA: Diagnosis not present

## 2019-05-21 DIAGNOSIS — F209 Schizophrenia, unspecified: Secondary | ICD-10-CM | POA: Diagnosis present

## 2019-05-21 DIAGNOSIS — R509 Fever, unspecified: Secondary | ICD-10-CM | POA: Diagnosis present

## 2019-05-21 DIAGNOSIS — A419 Sepsis, unspecified organism: Secondary | ICD-10-CM

## 2019-05-21 LAB — CBC WITH DIFFERENTIAL/PLATELET
Abs Immature Granulocytes: 0.07 K/uL (ref 0.00–0.07)
Basophils Absolute: 0 K/uL (ref 0.0–0.1)
Basophils Relative: 0 %
Eosinophils Absolute: 0 K/uL (ref 0.0–0.5)
Eosinophils Relative: 0 %
HCT: 26 % — ABNORMAL LOW (ref 36.0–46.0)
Hemoglobin: 8.2 g/dL — ABNORMAL LOW (ref 12.0–15.0)
Immature Granulocytes: 1 %
Lymphocytes Relative: 14 %
Lymphs Abs: 1.7 K/uL (ref 0.7–4.0)
MCH: 30.7 pg (ref 26.0–34.0)
MCHC: 31.5 g/dL (ref 30.0–36.0)
MCV: 97.4 fL (ref 80.0–100.0)
Monocytes Absolute: 1.2 K/uL — ABNORMAL HIGH (ref 0.1–1.0)
Monocytes Relative: 10 %
Neutro Abs: 9.5 K/uL — ABNORMAL HIGH (ref 1.7–7.7)
Neutrophils Relative %: 75 %
Platelets: 172 K/uL (ref 150–400)
RBC: 2.67 MIL/uL — ABNORMAL LOW (ref 3.87–5.11)
RDW: 15 % (ref 11.5–15.5)
WBC: 12.5 K/uL — ABNORMAL HIGH (ref 4.0–10.5)
nRBC: 0 % (ref 0.0–0.2)

## 2019-05-21 LAB — COMPREHENSIVE METABOLIC PANEL WITH GFR
ALT: 46 U/L — ABNORMAL HIGH (ref 0–44)
AST: 55 U/L — ABNORMAL HIGH (ref 15–41)
Albumin: 1.6 g/dL — ABNORMAL LOW (ref 3.5–5.0)
Alkaline Phosphatase: 179 U/L — ABNORMAL HIGH (ref 38–126)
Anion gap: 8 (ref 5–15)
BUN: 37 mg/dL — ABNORMAL HIGH (ref 6–20)
CO2: 22 mmol/L (ref 22–32)
Calcium: 8.7 mg/dL — ABNORMAL LOW (ref 8.9–10.3)
Chloride: 104 mmol/L (ref 98–111)
Creatinine, Ser: 0.77 mg/dL (ref 0.44–1.00)
GFR calc Af Amer: >60 mL/min
GFR calc non Af Amer: >60 mL/min
Glucose, Bld: 158 mg/dL — ABNORMAL HIGH (ref 70–99)
Potassium: 4 mmol/L (ref 3.5–5.1)
Sodium: 134 mmol/L — ABNORMAL LOW (ref 135–145)
Total Bilirubin: 0.7 mg/dL (ref 0.3–1.2)
Total Protein: 6.7 g/dL (ref 6.5–8.1)

## 2019-05-21 LAB — URINALYSIS, ROUTINE W REFLEX MICROSCOPIC
Bilirubin Urine: NEGATIVE
Glucose, UA: NEGATIVE mg/dL
Ketones, ur: NEGATIVE mg/dL
Nitrite: NEGATIVE
Protein, ur: >=300 mg/dL — AB
RBC / HPF: >50 RBC/hpf — ABNORMAL HIGH (ref 0–5)
Specific Gravity, Urine: 1.017 (ref 1.005–1.030)
WBC, UA: >50 WBC/hpf — ABNORMAL HIGH (ref 0–5)
pH: 6 (ref 5.0–8.0)

## 2019-05-21 LAB — TYPE AND SCREEN
ABO/RH(D): O POS
Antibody Screen: NEGATIVE

## 2019-05-21 LAB — LACTIC ACID, PLASMA: Lactic Acid, Venous: 1.5 mmol/L (ref 0.5–1.9)

## 2019-05-21 LAB — IRON AND TIBC
Iron: 5 ug/dL — ABNORMAL LOW (ref 28–170)
Saturation Ratios: 4 % — ABNORMAL LOW (ref 10.4–31.8)
TIBC: 143 ug/dL — ABNORMAL LOW (ref 250–450)
UIBC: 138 ug/dL

## 2019-05-21 LAB — RETICULOCYTES
Immature Retic Fract: 13 % (ref 2.3–15.9)
RBC.: 2.67 MIL/uL — ABNORMAL LOW (ref 3.87–5.11)
Retic Count, Absolute: 45.7 10*3/uL (ref 19.0–186.0)
Retic Ct Pct: 1.7 % (ref 0.4–3.1)

## 2019-05-21 LAB — RESPIRATORY PANEL BY RT PCR (FLU A&B, COVID)
Influenza A by PCR: NEGATIVE
Influenza B by PCR: NEGATIVE
SARS Coronavirus 2 by RT PCR: NEGATIVE

## 2019-05-21 LAB — FERRITIN: Ferritin: 574 ng/mL — ABNORMAL HIGH (ref 11–307)

## 2019-05-21 LAB — VITAMIN B12: Vitamin B-12: 679 pg/mL (ref 180–914)

## 2019-05-21 LAB — FOLATE: Folate: 23.5 ng/mL (ref >5.9)

## 2019-05-21 MED ORDER — VANCOMYCIN HCL IN DEXTROSE 1-5 GM/200ML-% IV SOLN
1000.0000 mg | Freq: Once | INTRAVENOUS | Status: AC
  Administered 2019-05-21: 1000 mg via INTRAVENOUS
  Filled 2019-05-21: qty 200

## 2019-05-21 MED ORDER — OXYCODONE HCL 5 MG PO TABS: 5.0000 mg | ORAL_TABLET | Freq: Four times a day (QID) | ORAL | Status: DC | PRN

## 2019-05-21 MED ORDER — ENOXAPARIN SODIUM 40 MG/0.4ML ~~LOC~~ SOLN: 40.0000 mg | SUBCUTANEOUS | Status: DC

## 2019-05-21 MED ORDER — SODIUM CHLORIDE 0.9% FLUSH: 3.0000 mL | INTRAVENOUS | Status: DC | PRN

## 2019-05-21 MED ORDER — PIPERACILLIN-TAZOBACTAM 3.375 G IVPB 30 MIN
3.3750 g | Freq: Once | INTRAVENOUS | Status: DC
  Administered 2019-05-21: 3.375 g via INTRAVENOUS
  Filled 2019-05-21: qty 50

## 2019-05-21 MED ORDER — IOHEXOL 300 MG/ML  SOLN
100.0000 mL | Freq: Once | INTRAMUSCULAR | Status: AC | PRN
  Administered 2019-05-21: 100 mL via INTRAVENOUS

## 2019-05-21 MED ORDER — SODIUM CHLORIDE 0.9 % IV SOLN: 250.0000 mL | INTRAVENOUS | Status: DC | PRN

## 2019-05-21 MED ORDER — INSULIN ASPART 100 UNIT/ML ~~LOC~~ SOLN: 0.0000 [IU] | Freq: Three times a day (TID) | SUBCUTANEOUS | Status: DC

## 2019-05-21 MED ORDER — GABAPENTIN 300 MG PO CAPS: 300.0000 mg | ORAL_CAPSULE | Freq: Two times a day (BID) | ORAL | Status: DC

## 2019-05-21 MED ORDER — ESCITALOPRAM OXALATE 10 MG PO TABS: 10.0000 mg | ORAL_TABLET | Freq: Every day | ORAL | Status: DC

## 2019-05-21 MED ORDER — SODIUM CHLORIDE 0.9% FLUSH: 3.0000 mL | Freq: Two times a day (BID) | INTRAVENOUS | Status: DC

## 2019-05-21 MED ORDER — CHLORHEXIDINE GLUCONATE CLOTH 2 % EX PADS: 6.0000 | MEDICATED_PAD | Freq: Every day | CUTANEOUS | Status: DC

## 2019-05-21 MED ORDER — SIMETHICONE 80 MG PO CHEW
80.0000 mg | CHEWABLE_TABLET | Freq: Four times a day (QID) | ORAL | Status: DC | PRN
  Filled 2019-05-21: qty 1

## 2019-05-21 MED ORDER — METRONIDAZOLE NICU ORAL SYRINGE 50 MG/ML: 500.0000 mg | Freq: Three times a day (TID) | ORAL | 0 refills | Status: DC

## 2019-05-21 MED ORDER — AMLODIPINE BESYLATE 5 MG PO TABS: 5.0000 mg | ORAL_TABLET | Freq: Every day | ORAL | Status: DC

## 2019-05-21 MED ORDER — MELATONIN 3 MG PO TABS
3.0000 mg | ORAL_TABLET | Freq: Every day | ORAL | Status: DC
  Filled 2019-05-21: qty 1

## 2019-05-21 MED ORDER — INSULIN GLARGINE 100 UNIT/ML ~~LOC~~ SOLN
10.0000 [IU] | Freq: Every morning | SUBCUTANEOUS | Status: DC
  Filled 2019-05-21 (×2): qty 0.1

## 2019-05-21 MED ORDER — SODIUM CHLORIDE 0.9% FLUSH: 10.0000 mL | Freq: Two times a day (BID) | INTRAVENOUS | Status: DC

## 2019-05-21 MED ORDER — METOPROLOL TARTRATE 25 MG PO TABS: 50.0000 mg | ORAL_TABLET | Freq: Two times a day (BID) | ORAL | Status: DC

## 2019-05-21 MED ORDER — SODIUM CHLORIDE 0.9% FLUSH: 10.0000 mL | INTRAVENOUS | Status: DC | PRN

## 2019-05-21 MED ORDER — AMOXICILLIN-POT CLAVULANATE 250-62.5 MG/5ML PO SUSR: 250.0000 mg | Freq: Three times a day (TID) | ORAL | 0 refills | Status: DC

## 2019-05-21 MED ORDER — PANTOPRAZOLE SODIUM 40 MG PO TBEC: 40.0000 mg | DELAYED_RELEASE_TABLET | Freq: Every day | ORAL | Status: DC

## 2019-05-21 NOTE — ED Triage Notes (Addendum)
Pt to ED via EMS from [facility]. Sent to ED for abnormal labs; WBC elevated 13.6, low RBC 2.57.  Multiple drains : JP drain LLQ and  JP RUQ, midline ostomy. Indwellinhg Foley cath. BILAT BKA . Right stump drainage. Left stump Necrotic eschar and foul smelling drainage  Last VS:170/74, hr 90, rr 22, 96 Room air, cbg 226. HX DM: MOST FORM at bedside.

## 2019-05-21 NOTE — ED Notes (Signed)
Called pt placement to cancel bed request. Will have NS to call [facility].

## 2019-05-21 NOTE — ED Notes (Signed)
Patient verbalizes understanding of discharge instructions. Opportunity for questioning and answers were provided. Armband removed by staff, pt discharged from ED.  

## 2019-05-21 NOTE — Consult Note (Signed)
Medical Consultation   Gina [name]  SLH:[id number]  DOB: 12/03/1960  DOA: 05/21/2019  PCP: Gina Serene, MD (Confirm with patient/family/NH records and if not entered, this has to be entered at [facility] point of entry)           [name], attending MD at [facility]: unknown - general surgeon [facility] speciality and name if known)   Requesting physician: Dr. Miller/Lockwood  Reason for consultation: Patient with mild leukocytosis and low grade fever   History of Present Illness: Gina [name] is an 59 y.o. female with history of diabetes, bilateral BKA and recent prolonged hospitalization for perforated pyloric ulcer with sepsis, intraabdominal abscess, s/p ex. Lap with residual fistula and JP drains for complex fluid collection under left hepatic lobe consistent with residual abscess. [facility] in [facility] and transitioned to SNF at [facility] where she was receiving rehabilitative care and continued TPN. For 36-48 hours prior to eval patient complained of feeling bad and she had a temperature to 99.9. Her TPN labs revealed a Hgb 7.9 and WBC 13.8. For these reasons she was sent to [facility] ED for evaluation.  In the ED she was febrile to 99.9. Lab revealed WBC 12.5 with normal differential and Hgb 82 - down from 8.9 in early February. CT revealed fluid collect 7.9 x 1.5 cm under left hepatic lobe, actually smaller than previous study. Renal function and electrolytes were normal CXR with atelectasis. EKG with SR. GS saw patient and had no recommendations re: acute interventions.    (The initial 2-3 lines should be focused and good to copy and paste in the HPI section of the daily progress note).  (Please avoid self-populating past medical history here)  (For level 3, the HPI must include 4+ descriptors: Location, Quality, Severity, Duration, Timing, Context, modifying factors, associated signs/symptoms and/or status of 3+ chronic  problems.)       Review of Systems:  ROS As per HPI otherwise 10 point review of systems negative.  Unacceptable ROS statements: "10 systems reviewed," "Extensive" (without elaboration).  Acceptable ROS statements: "All others negative," "All others reviewed and are negative," and "All others unremarkable," with at Center documented Can't double dip - if using for HPI can't use for ROS   Past Medical History: Past Medical History:  Diagnosis Date  . Diabetes mellitus   . Hypertension   . Osteomyelitis of right foot ([place]) 02/22/2017  . Schizophrenia ([place])   . Septic arthritis of interphalangeal joint of toe of left foot ([place]) 06/02/2016  . Stress incontinence 04/26/2017    Past Surgical History: Past Surgical History:  Procedure Laterality Date  . AMPUTATION Left 06/05/2016   Procedure: LEFT FOOT TRANSMETATARSAL AMPUTATION;  Surgeon: [name], MD;  Location: [facility];  Service: Orthopedics;  Laterality: Left;  . AMPUTATION Right 11/11/2017   Procedure: AMPUTATION BELOW KNEE;  Surgeon: [name], MD;  Location: [place];  Service: Orthopedics;  Laterality: Right;  . AMPUTATION Left 05/10/2018   Procedure: LEFT BELOW KNEE AMPUTATION;  Surgeon: [name], MD;  Location: [place];  Service: Orthopedics;  Laterality: Left;  . AMPUTATION Left 12/28/2018   Procedure: AMPUTATION THIRD DIGIT;  Surgeon: Iran Planas, MD;  Location: [facility];  Service: Orthopedics;  Laterality: Left;  . AMPUTATION TOE Right 02/23/2017   Procedure: AMPUTATION RIGHT THIRD TOE;  Surgeon: [name], MD;  Location: [place];  Service:  Orthopedics;  Laterality: Right;  . I & D EXTREMITY Right 11/09/2017   Procedure: Debride Ulcer Right Heel;  Surgeon: [name], MD;  Location: [place];  Service: Orthopedics;  Laterality: Right;  . I & D EXTREMITY Left 12/28/2018   Procedure: IRRIGATION AND DEBRIDEMENT EXTREMITY;  Surgeon: Iran Planas, MD;  Location: [facility];  Service: Orthopedics;  Laterality:  Left;  . INCISION AND DRAINAGE OF WOUND Left 12/26/2018   Procedure: IRRIGATION AND DEBRIDEMENT LEFT HAND;  Surgeon: Iran Planas, MD;  Location: [facility];  Service: Orthopedics;  Laterality: Left;  . LAPAROTOMY N/A 02/02/2019   Procedure: EXPLORATORY LAPAROTOMY WITH OVERSEW OF DUODENAL ULCER;  Surgeon: Alphonsa Overall, MD;  Location: [facility];  Service: General;  Laterality: N/A;  . TUBAL LIGATION       Allergies:   Allergies  Allergen Reactions  . Bee Venom Swelling    Swells up with bee stings   . Metformin And Related Other (See Comments)    Upset stomach     Social History:  HSG,  Never married. Has one daughter and 2 sons, 7 grandsons and 1 great-grandson. She was living alone.    reports that she has never smoked. She has never used smokeless tobacco. She reports that she does not drink alcohol or use drugs.   Family History: Family History  Problem Relation Age of Onset  . Diabetes type II Father   . CAD Father   . Prostate cancer Father   . Diabetes Mellitus II Brother     Unacceptable: Noncontributory, unremarkable, or negative. Acceptable: Family history reviewed and not pertinent (If you reviewed it)   Physical Exam: Vitals:   05/21/19 1415 05/21/19 1430 05/21/19 1445 05/21/19 1515  BP: (!) 145/74 (!) 147/71 (!) 142/79 (!) 153/74  Pulse: 91 93 92 94  Resp:    18  Temp:      TempSrc:      SpO2: 96% 95% 95% 98%  Weight:      Height:        Constitutional: Appearance,  Alert and awake, oriented x3, not in any acute distress. Eyes: PERLA, EOMI, irises appear normal, anicteric sclera,  ENMT: external ears and nose appear normal, normal hearing            Lips appears normal, oropharynx mucosa normal, tongue coated, missing most of her teeth, no oral lesions noted Neck: neck appears normal, no masses, normal ROM, no thyromegaly, no JVD  CVS: S1-S2 clear, no murmur rubs or gallops, Respiratory:  clear to auscultation bilaterally, no wheezing, rales or  rhonchi. Respiratory effort normal. No accessory muscle use.  Abdomen: Hypoactive BS, umbilical fistula with colostomy bag, JP drain RUQ and LLQ with continued drainage. Not tender, not distended.  Musculoskeletal: : no cyanosis, claw hand deformity left hand, Bilateral BKA Neuro: Cranial nerves II-XII intact Psych: judgement and insight into her condition is poor, stable mood and affect, mental status Skin: multiple vascular ulcers distal thighs, end of stumps with with eschar over chronic wounds. Right buttock ulcer noted.  (Anything < 9 systems with 2 bullets each down codes to level 1) (If patient refuses exam can't bill higher level) (Make sure to document decubitus ulcers present on admission -- if possible -- and whether patient has chronic indwelling catheter at time of admission)  Data reviewed:  I have personally reviewed following labs and imaging studies Labs:  CBC: Recent Labs  Lab 05/21/19 1038  WBC 12.5*  NEUTROABS 9.5*  HGB 8.2*  HCT  26.0*  MCV 97.4  PLT 314    Basic Metabolic Panel: Recent Labs  Lab 05/21/19 1038  NA 134*  K 4.0  CL 104  CO2 22  GLUCOSE 158*  BUN 37*  CREATININE 0.77  CALCIUM 8.7*   GFR Estimated Creatinine Clearance: 60.6 mL/min (by C-G formula based on SCr of 0.77 mg/dL). Liver Function Tests: Recent Labs  Lab 05/21/19 1038  AST 55*  ALT 46*  ALKPHOS 179*  BILITOT 0.7  PROT 6.7  ALBUMIN 1.6*   No results for input(s): LIPASE, AMYLASE in the last 168 hours. No results for input(s): AMMONIA in the last 168 hours. Coagulation profile No results for input(s): INR, PROTIME in the last 168 hours.  Cardiac Enzymes: No results for input(s): CKTOTAL, CKMB, CKMBINDEX, TROPONINI in the last 168 hours. BNP: Invalid input(s): POCBNP CBG: No results for input(s): GLUCAP in the last 168 hours. D-Dimer No results for input(s): DDIMER in the last 72 hours. Hgb A1c No results for input(s): HGBA1C in the last 72 hours. Lipid  Profile No results for input(s): CHOL, HDL, LDLCALC, TRIG, CHOLHDL, LDLDIRECT in the last 72 hours. Thyroid function studies No results for input(s): TSH, T4TOTAL, T3FREE, THYROIDAB in the last 72 hours.  Invalid input(s): FREET3 Anemia work up Recent Labs    05/21/19 1038  VITAMINB12 679  FOLATE 23.5  FERRITIN 574*  TIBC 143*  IRON 5*  RETICCTPCT 1.7   Urinalysis    Component Value Date/Time   COLORURINE AMBER (A) 05/21/2019 1122   APPEARANCEUR CLOUDY (A) 05/21/2019 1122   LABSPEC 1.017 05/21/2019 1122   PHURINE 6.0 05/21/2019 1122   GLUCOSEU NEGATIVE 05/21/2019 1122   HGBUR LARGE (A) 05/21/2019 1122   BILIRUBINUR NEGATIVE 05/21/2019 1122   BILIRUBINUR N 04/28/2016 1603   KETONESUR NEGATIVE 05/21/2019 1122   PROTEINUR >=300 (A) 05/21/2019 1122   UROBILINOGEN 0.2 04/28/2016 1603   UROBILINOGEN 0.2 06/18/2013 0650   NITRITE NEGATIVE 05/21/2019 1122   LEUKOCYTESUR LARGE (A) 05/21/2019 1122     Microbiology Recent Results (from the past 240 hour(s))  Respiratory Panel by RT PCR (Flu A&B, Covid) - Nasopharyngeal Swab     Status: None   Collection Time: 05/21/19 10:03 AM   Specimen: Nasopharyngeal Swab  Result Value Ref Range Status   SARS Coronavirus 2 by RT PCR NEGATIVE NEGATIVE Final    Comment: (NOTE) SARS-CoV-2 target nucleic acids are NOT DETECTED. The SARS-CoV-2 RNA is generally detectable in upper respiratoy specimens during the acute phase of infection. The lowest concentration of SARS-CoV-2 viral copies this assay can detect is 131 copies/mL. A negative result does not preclude SARS-Cov-2 infection and should not be used as the sole basis for treatment or other patient management decisions. A negative result may occur with  improper specimen collection/handling, submission of specimen other than nasopharyngeal swab, presence of viral mutation(s) within the areas targeted by this assay, and inadequate number of viral copies (<131 copies/mL). A negative  result must be combined with clinical observations, patient history, and epidemiological information. The expected result is Negative. Fact Sheet for Patients:  PinkCheek.be Fact Sheet for Healthcare Providers:  GravelBags.it This test is not yet ap proved or cleared by the [place] FDA and  has been authorized for detection and/or diagnosis of SARS-CoV-2 by FDA under an Emergency Use Authorization (EUA). This EUA will remain  in effect (meaning this test can be used) for the duration of the COVID-19 declaration under Section 564(b)(1) of the Act, 21 U.S.C. section 360bbb-3(b)(1), unless the  authorization is terminated or revoked sooner.    Influenza A by PCR NEGATIVE NEGATIVE Final   Influenza B by PCR NEGATIVE NEGATIVE Final    Comment: (NOTE) The Xpert Xpress SARS-CoV-2/FLU/RSV assay is intended as an aid in  the diagnosis of influenza from Nasopharyngeal swab specimens and  should not be used as a sole basis for treatment. Nasal washings and  aspirates are unacceptable for Xpert Xpress SARS-CoV-2/FLU/RSV  testing. Fact Sheet for Patients: PinkCheek.be Fact Sheet for Healthcare Providers: GravelBags.it This test is not yet approved or cleared by the [place] FDA and  has been authorized for detection and/or diagnosis of SARS-CoV-2 by  FDA under an Emergency Use Authorization (EUA). This EUA will remain  in effect (meaning this test can be used) for the duration of the  Covid-19 declaration under Section 564(b)(1) of the Act, 21  U.S.C. section 360bbb-3(b)(1), unless the authorization is  terminated or revoked. Performed at [facility], [place] [address], [place], [place] [address]        Inpatient Medications:   Scheduled Meds: Continuous Infusions:   Radiological Exams on Admission: CT ABDOMEN PELVIS W CONTRAST  Result Date:  05/21/2019 CLINICAL DATA:  Acute generalized abdominal pain. EXAM: CT ABDOMEN AND PELVIS WITH CONTRAST TECHNIQUE: Multidetector CT imaging of the abdomen and pelvis was performed using the standard protocol following bolus administration of intravenous contrast. CONTRAST:  168mL OMNIPAQUE IOHEXOL 300 MG/ML  SOLN COMPARISON:  April 11, 2019. FINDINGS: Lower chest: Mild right pleural effusion is noted with associated atelectasis of the right lower lobe. Mild left basilar subsegmental atelectasis is noted. Hepatobiliary: Solitary gallstone is noted. No biliary dilatation is noted. The liver is unremarkable. Pancreas: Unremarkable. No pancreatic ductal dilatation or surrounding inflammatory changes. Spleen: Normal in size without focal abnormality. Adrenals/Urinary Tract: Adrenal glands and kidneys appear normal. No hydronephrosis or renal obstruction is noted. Urinary bladder is decompressed secondary to Foley catheter. Stomach/Bowel: The stomach is unremarkable. There is no evidence of bowel obstruction or inflammation. The appendix is unremarkable. Vascular/Lymphatic: No significant vascular findings are present. No enlarged abdominal or pelvic lymph nodes. Reproductive: Uterus and bilateral adnexa are unremarkable. Other: Surgical drain is seen passing underneath the liver with distal tip inferior to the left hepatic lobe. Continued presence of pigtail catheter within complex fluid collection anteriorly in the abdomen concerning for abscess. It is significantly smaller compared to prior exam, currently measuring 7.9 x 1.5 cm. Musculoskeletal: No acute or significant osseous findings. IMPRESSION: 1. Mild right pleural effusion is noted with associated atelectasis of the right lower lobe. 2. Solitary gallstone is noted. 3. Surgical drain is seen passing underneath the liver with distal tip inferior to the left hepatic lobe. Continued presence of complex fluid collection anteriorly in the abdomen concerning for  abscess. It is significantly smaller compared to prior exam, currently measuring 7.9 x 1.5 cm. Pigtail drainage catheter is unchanged in position. Electronically Signed   By: Marijo Conception M.D.   On: 05/21/2019 13:36   DG Chest Port 1 View  Result Date: 05/21/2019 CLINICAL DATA:  Weakness, altered mental status EXAM: PORTABLE CHEST 1 VIEW COMPARISON:  03/26/2019 FINDINGS: Left PICC line in place with the tip in the SVC. Low lung volumes. Bibasilar airspace opacities, right greater than left. Small right pleural effusion. Heart is mildly enlarged. No acute bony abnormality. IMPRESSION: Bibasilar atelectasis or infiltrates, right greater than left. Small right pleural effusion. Electronically Signed   By: Rolm Baptise M.D.   On: 05/21/2019 10:45  Impression/Recommendations Active Problems:   Leukocytosis   HTN (hypertension)   Diabetes (HCC)   Severe protein-calorie malnutrition (HCC)   Anemia of chronic disease   Intra-abdominal fluid collection   Fever with leukocytosis and leukocyte count less than 20,000  1. Leukocytosis - patient with mild leukocytosis with normal diff. No frank active infection indicated. May have early mild infection.    Recommend:  Return to SNF[facility] Oral Abx with liquid Augmentin 500 mg bid, metronidazole 500mg  tid x 7 days  2. Severe protein-calories malnutrition - continue current TPN regimen  3. Anemia chronic disease - no evidence of acute blood loss. Hgb relatively stable Recommend - iron replacement  4. Intrabdominal fluid collectio - improving. Recommend - continue with plan to schedule outpatient follow up with General surgery and GI as to timing of removal of drains and reinstitution of enteral nutrition.  5. Diabetes - continue present regimen.  Discussed diagnosis and treatment plan, including return to SNF, with the patient and her Daughter Ms. Ivan.   Thank you for this consultation.    Time Spent: 90 minutes  Adella Hare  M.D. [facility] 05/21/2019, 5:22 PM

## 2019-05-21 NOTE — H&P (Addendum)
CC: Abnormal labs at [facility]; CT showing intra-abdominal fluid collection  Requesting provider: Noemi Chapel, MD  HPI: Gina [name] is an 59 y.o. female with hx of HTN, DM, schizophrenia, stress urinary incontinence, bilateral BKAs, left middle finger amputation, who presented to the [facility] emergency department with perforated prepyloric ulcer.  She was taken emergently to the operating room 02/02/2019 for exploratory laparotomy, [name] patch repair of perforated prepyloric ulcer.  She had a protracted recovery and bacteremia. She was placed on TPN and underwent IV antibiotic therapy.  At one point she was having multiple bloody bowel movements with some concern for a gastrointestinal bleed.  CTA did not show active extravasation within the GI tract.  She did respond to transfusion.  In total, 5 units. She developed large volume bilious output 12/7 from surgical drain. Her previous UGIs did not show the stomach to be leaking.  She had multiple scans done, showed pneumatosis in segment of the small bowel, portal venous gas.  Subsequent CT scan did not show this to be clearly leaking from the stomach but she had a large collection in the central abdomen which was suspicious for a small bowel source.  IR drainage was obtained. Ultimately, 12/10 JP was placed on wall suction.  [facility] where she has been.  She has not had follow-up.  Over the past few days, she reports that she was told her lab work was slightly abnormal and by review of the nursing notes, she was complaining of some vague abdominal discomfort.  She was transferred to the [facility] emergency room.   She has been afebrile with normal vital signs. WBC: 2.5 Hemoglobin: 8.2, MCV normal Creatinine: 0.77 Albumin: 1.6  Currently, she reports she feels no different than she has of the last couple of days or even weeks.  She denies any issues with nausea or  vomiting, fever or chills.  She denies any abdominal pain.  She does report that she will occasionally pass gas and have bowel movements.  She has been on TPN since discharge from [facility]. States currently she is hungry and would love to eat food.  At time of discharge, she was noted to have a very poor prognosis.    Past Medical History:  Diagnosis Date  . Diabetes mellitus   . Hypertension   . Osteomyelitis of right foot ([place]) 02/22/2017  . Schizophrenia ([place])   . Septic arthritis of interphalangeal joint of toe of left foot ([place]) 06/02/2016  . Stress incontinence 04/26/2017    Past Surgical History:  Procedure Laterality Date  . AMPUTATION Left 06/05/2016   Procedure: LEFT FOOT TRANSMETATARSAL AMPUTATION;  Surgeon: [name], MD;  Location: [facility];  Service: Orthopedics;  Laterality: Left;  . AMPUTATION Right 11/11/2017   Procedure: AMPUTATION BELOW KNEE;  Surgeon: [name], MD;  Location: [place];  Service: Orthopedics;  Laterality: Right;  . AMPUTATION Left 05/10/2018   Procedure: LEFT BELOW KNEE AMPUTATION;  Surgeon: [name], MD;  Location: [place];  Service: Orthopedics;  Laterality: Left;  . AMPUTATION Left 12/28/2018   Procedure: AMPUTATION THIRD DIGIT;  Surgeon: Iran Planas, MD;  Location[facility];  Service: Orthopedics;  Laterality: Left;  . AMPUTATION TOE Right 02/23/2017   Procedure: AMPUTATION RIGHT THIRD TOE;  Surgeon: [name], MD;  Location: [place];  Service: Orthopedics;  Laterality: Right;  . I & D EXTREMITY Right 11/09/2017   Procedure: Debride Ulcer Right  Heel;  Surgeon: [name], MD;  Location: [place];  Service: Orthopedics;  Laterality: Right;  . I & D EXTREMITY Left 12/28/2018   Procedure: IRRIGATION AND DEBRIDEMENT EXTREMITY;  Surgeon: Iran Planas, MD;  [facility];  Service: Orthopedics;  Laterality: Left;  . INCISION AND DRAINAGE OF WOUND Left 12/26/2018   Procedure: IRRIGATION AND DEBRIDEMENT LEFT HAND;  Surgeon: Iran Planas, MD;   [facility];  Service: Orthopedics;  Laterality: Left;  . LAPAROTOMY N/A 02/02/2019   Procedure: EXPLORATORY LAPAROTOMY WITH OVERSEW OF DUODENAL ULCER;  Surgeon: Alphonsa Overall, MD;  [facility];  Service: General;  Laterality: N/A;  . TUBAL LIGATION      Family History  Problem Relation Age of Onset  . Diabetes type II Father   . CAD Father   . Prostate cancer Father   . Diabetes Mellitus II Brother     Social:  reports that she has never smoked. She has never used smokeless tobacco. She reports that she does not drink alcohol or use drugs.  Allergies:  Allergies  Allergen Reactions  . Bee Venom Swelling    Swells up with bee stings   . Metformin And Related Other (See Comments)    Upset stomach    Medications: I have reviewed the patient's current medications.  Results for orders placed or performed during the hospital encounter of 05/21/19 (from the past 48 hour(s))  Respiratory Panel by RT PCR (Flu A&B, Covid) - Nasopharyngeal Swab     Status: None   Collection Time: 05/21/19 10:03 AM   Specimen: Nasopharyngeal Swab  Result Value Ref Range   SARS Coronavirus 2 by RT PCR NEGATIVE NEGATIVE    Comment: (NOTE) SARS-CoV-2 target nucleic acids are NOT DETECTED. The SARS-CoV-2 RNA is generally detectable in upper respiratoy specimens during the acute phase of infection. The lowest concentration of SARS-CoV-2 viral copies this assay can detect is 131 copies/mL. A negative result does not preclude SARS-Cov-2 infection and should not be used as the sole basis for treatment or other patient management decisions. A negative result may occur with  improper specimen collection/handling, submission of specimen other than nasopharyngeal swab, presence of viral mutation(s) within the areas targeted by this assay, and inadequate number of viral copies (<131 copies/mL). A negative result must be combined with clinical observations, patient history, and epidemiological  information. The expected result is Negative. Fact Sheet for Patients:  PinkCheek.be Fact Sheet for Healthcare Providers:  GravelBags.it This test is not yet ap proved or cleared by the [place] FDA and  has been authorized for detection and/or diagnosis of SARS-CoV-2 by FDA under an Emergency Use Authorization (EUA). This EUA will remain  in effect (meaning this test can be used) for the duration of the COVID-19 declaration under Section 564(b)(1) of the Act, 21 U.S.C. section 360bbb-3(b)(1), unless the authorization is terminated or revoked sooner.    Influenza A by PCR NEGATIVE NEGATIVE   Influenza B by PCR NEGATIVE NEGATIVE    Comment: (NOTE) The Xpert Xpress SARS-CoV-2/FLU/RSV assay is intended as an aid in  the diagnosis of influenza from Nasopharyngeal swab specimens and  should not be used as a sole basis for treatment. Nasal washings and  aspirates are unacceptable for Xpert Xpress SARS-CoV-2/FLU/RSV  testing. Fact Sheet for Patients: PinkCheek.be Fact Sheet for Healthcare Providers: GravelBags.it This test is not yet approved or cleared by the [place] FDA and  has been authorized for detection and/or diagnosis of SARS-CoV-2 by  FDA under  an Emergency Use Authorization (EUA). This EUA will remain  in effect (meaning this test can be used) for the duration of the  Covid-19 declaration under Section 564(b)(1) of the Act, 21  U.S.C. section 360bbb-3(b)(1), unless the authorization is  terminated or revoked. Performed at [facility], [place] [address], [place], [place] [address]   CBC with Differential/Platelet     Status: Abnormal   Collection Time: 05/21/19 10:38 AM  Result Value Ref Range   WBC 12.5 (H) 4.0 - 10.5 K/uL   RBC 2.67 (L) 3.87 - 5.11 MIL/uL   Hemoglobin 8.2 (L) 12.0 - 15.0 g/dL   HCT 26.0 (L) 36.0 - 46.0 %   MCV 97.4 80.0 -  100.0 fL   MCH 30.7 26.0 - 34.0 pg   MCHC 31.5 30.0 - 36.0 g/dL   RDW 15.0 11.5 - 15.5 %   Platelets 172 150 - 400 K/uL   nRBC 0.0 0.0 - 0.2 %   Neutrophils Relative % 75 %   Neutro Abs 9.5 (H) 1.7 - 7.7 K/uL   Lymphocytes Relative 14 %   Lymphs Abs 1.7 0.7 - 4.0 K/uL   Monocytes Relative 10 %   Monocytes Absolute 1.2 (H) 0.1 - 1.0 K/uL   Eosinophils Relative 0 %   Eosinophils Absolute 0.0 0.0 - 0.5 K/uL   Basophils Relative 0 %   Basophils Absolute 0.0 0.0 - 0.1 K/uL   Immature Granulocytes 1 %   Abs Immature Granulocytes 0.07 0.00 - 0.07 K/uL    Comment: Performed at [facility], [place] [address] [place], [place] [address]  Comprehensive metabolic panel     Status: Abnormal   Collection Time: 05/21/19 10:38 AM  Result Value Ref Range   Sodium 134 (L) 135 - 145 mmol/L   Potassium 4.0 3.5 - 5.1 mmol/L   Chloride 104 98 - 111 mmol/L   CO2 22 22 - 32 mmol/L   Glucose, Bld 158 (H) 70 - 99 mg/dL    Comment: Glucose reference range applies only to samples taken after fasting for at least 8 hours.   BUN 37 (H) 6 - 20 mg/dL   Creatinine, Ser 0.77 0.44 - 1.00 mg/dL   Calcium 8.7 (L) 8.9 - 10.3 mg/dL   Total Protein 6.7 6.5 - 8.1 g/dL   Albumin 1.6 (L) 3.5 - 5.0 g/dL   AST 55 (H) 15 - 41 U/L   ALT 46 (H) 0 - 44 U/L   Alkaline Phosphatase 179 (H) 38 - 126 U/L   Total Bilirubin 0.7 0.3 - 1.2 mg/dL   GFR calc non Af Amer >60 >60 mL/min   GFR calc Af Amer >60 >60 mL/min   Anion gap 8 5 - 15    Comment: Performed at [facility] [address] [place], [place] [address]  Vitamin B12     Status: None   Collection Time: 05/21/19 10:38 AM  Result Value Ref Range   Vitamin B-12 679 180 - 914 pg/mL    Comment: (NOTE) This assay is not validated for testing neonatal or myeloproliferative syndrome specimens for Vitamin B12 levels. Performed at [facility], [place] [address] [place], [place] [address]   Iron and TIBC     Status: Abnormal   Collection Time: 05/21/19  10:38 AM  Result Value Ref Range   Iron 5 (L) 28 - 170 ug/dL   TIBC 143 (L) 250 - 450 ug/dL   Saturation Ratios 4 (L) 10.4 -  31.8 %   UIBC 138 ug/dL    Comment: Performed at [facility], [place] [address], [place], [place] [address]  Ferritin     Status: Abnormal   Collection Time: 05/21/19 10:38 AM  Result Value Ref Range   Ferritin 574 (H) 11 - 307 ng/mL    Comment: Performed at [facility], [place] [address] [place], [place] [address]  Lactic acid, plasma     Status: None   Collection Time: 05/21/19 10:38 AM  Result Value Ref Range   Lactic Acid, Venous 1.5 0.5 - 1.9 mmol/L    Comment: Performed at [facility] [address] [place], [place] [address]  Folate     Status: None   Collection Time: 05/21/19 10:38 AM  Result Value Ref Range   Folate 23.5 >5.9 ng/mL    Comment: Performed at [facility] [address] [place], [place] [address]  Reticulocytes     Status: Abnormal   Collection Time: 05/21/19 10:38 AM  Result Value Ref Range   Retic Ct Pct 1.7 0.4 - 3.1 %   RBC. 2.67 (L) 3.87 - 5.11 MIL/uL   Retic Count, Absolute 45.7 19.0 - 186.0 K/uL   Immature Retic Fract 13.0 2.3 - 15.9 %    Comment: Performed at [facility] [address] [place], [place] [address]  Type and screen     Status: None   Collection Time: 05/21/19 10:43 AM  Result Value Ref Range   ABO/RH(D) O POS    Antibody Screen NEG    Sample Expiration      05/24/2019,2359 Performed at [facility], [place] [address], [place], [place] [address]   Urinalysis, Routine w reflex microscopic     Status: Abnormal   Collection Time: 05/21/19 11:22 AM  Result Value Ref Range   Color, Urine AMBER (A) YELLOW    Comment: BIOCHEMICALS MAY BE AFFECTED BY COLOR   APPearance CLOUDY (A) CLEAR   Specific Gravity, Urine 1.017 1.005 - 1.030   pH 6.0 5.0 - 8.0   Glucose, UA NEGATIVE NEGATIVE mg/dL   Hgb urine dipstick LARGE (A) NEGATIVE   Bilirubin Urine NEGATIVE NEGATIVE   Ketones,  ur NEGATIVE NEGATIVE mg/dL   Protein, ur >=300 (A) NEGATIVE mg/dL   Nitrite NEGATIVE NEGATIVE   Leukocytes,Ua LARGE (A) NEGATIVE   RBC / HPF >50 (H) 0 - 5 RBC/hpf   WBC, UA >50 (H) 0 - 5 WBC/hpf   Bacteria, UA RARE (A) NONE SEEN   Squamous Epithelial / LPF 0-5 0 - 5   WBC Clumps PRESENT    Mucus PRESENT    Budding Yeast PRESENT     Comment: Performed at [facility], [address] [place], [place] [address]    CT ABDOMEN PELVIS W CONTRAST  Result Date: 05/21/2019 CLINICAL DATA:  Acute generalized abdominal pain. EXAM: CT ABDOMEN AND PELVIS WITH CONTRAST TECHNIQUE: Multidetector CT imaging of the abdomen and pelvis was performed using the standard protocol following bolus administration of intravenous contrast. CONTRAST:  119mL OMNIPAQUE IOHEXOL 300 MG/ML  SOLN COMPARISON:  April 11, 2019. FINDINGS: Lower chest: Mild right pleural effusion is noted with associated atelectasis of the right lower lobe. Mild left basilar subsegmental atelectasis is noted. Hepatobiliary: Solitary gallstone is noted. No biliary dilatation is noted. The liver is unremarkable. Pancreas: Unremarkable. No pancreatic ductal dilatation or surrounding inflammatory changes. Spleen: Normal in size without focal abnormality. Adrenals/Urinary Tract: Adrenal glands  and kidneys appear normal. No hydronephrosis or renal obstruction is noted. Urinary bladder is decompressed secondary to Foley catheter. Stomach/Bowel: The stomach is unremarkable. There is no evidence of bowel obstruction or inflammation. The appendix is unremarkable. Vascular/Lymphatic: No significant vascular findings are present. No enlarged abdominal or pelvic lymph nodes. Reproductive: Uterus and bilateral adnexa are unremarkable. Other: Surgical drain is seen passing underneath the liver with distal tip inferior to the left hepatic lobe. Continued presence of pigtail catheter within complex fluid collection anteriorly in the abdomen concerning for abscess.  It is significantly smaller compared to prior exam, currently measuring 7.9 x 1.5 cm. Musculoskeletal: No acute or significant osseous findings. IMPRESSION: 1. Mild right pleural effusion is noted with associated atelectasis of the right lower lobe. 2. Solitary gallstone is noted. 3. Surgical drain is seen passing underneath the liver with distal tip inferior to the left hepatic lobe. Continued presence of complex fluid collection anteriorly in the abdomen concerning for abscess. It is significantly smaller compared to prior exam, currently measuring 7.9 x 1.5 cm. Pigtail drainage catheter is unchanged in position. Electronically Signed   By: Marijo Conception M.D.   On: 05/21/2019 13:36   DG Chest Port 1 View  Result Date: 05/21/2019 CLINICAL DATA:  Weakness, altered mental status EXAM: PORTABLE CHEST 1 VIEW COMPARISON:  03/26/2019 FINDINGS: Left PICC line in place with the tip in the SVC. Low lung volumes. Bibasilar airspace opacities, right greater than left. Small right pleural effusion. Heart is mildly enlarged. No acute bony abnormality. IMPRESSION: Bibasilar atelectasis or infiltrates, right greater than left. Small right pleural effusion. Electronically Signed   By: Rolm Baptise M.D.   On: 05/21/2019 10:45    ROS - all of the below systems have been reviewed with the patient and positives are indicated with bold text General: chills, fever or night sweats Eyes: blurry vision or double vision ENT: epistaxis or sore throat Allergy/Immunology: itchy/watery eyes or nasal congestion Hematologic/Lymphatic: bleeding problems, blood clots or swollen lymph nodes Endocrine: temperature intolerance or unexpected weight changes Breast: new or changing breast lumps or nipple discharge Resp: cough, shortness of breath, or wheezing CV: chest pain or dyspnea on exertion GI: as per HPI GU: dysuria, trouble voiding, or hematuria MSK: joint pain or joint stiffness Neuro: TIA or stroke symptoms Derm: pruritus  and skin lesion changes Psych: anxiety and depression  PE Blood pressure (!) 153/74, pulse 94, temperature 99.9 F (37.7 C), temperature source Oral, resp. rate 18, height 5\' 2"  (1.575 m), weight 53.1 kg, last menstrual period 03/29/2015, SpO2 98 %. Constitutional: NAD; conversant; no deformities Eyes: Moist conjunctiva; no lid lag; anicteric; PERRL Neck: Trachea midline; no thyromegaly Lungs: Normal respiratory effort; no tactile fremitus CV: RRR; no palpable thrills; bilateral BKA stumps with small stump wounds with fibrinous exudate/ulcer without surrounding erythema or purulent drainage.  Right greater than left. GI: Abdomen soft, nontender, nondistended.  JP drain with GI contents in bulb; Eakin pouch with GI contents and appliance; IR drain with dark brown drainage; no palpable hepatosplenomegaly MSK: Limited range of motion of all extremities; lower legs surgically absent; left middle finger surgically absent Psychiatric: Appropriate affect; alert and oriented x3 Lymphatic: No palpable cervical or axillary lymphadenopathy  Results for orders placed or performed during the hospital encounter of 05/21/19 (from the past 48 hour(s))  Respiratory Panel by RT PCR (Flu A&B, Covid) - Nasopharyngeal Swab     Status: None   Collection Time: 05/21/19 10:03 AM   Specimen: Nasopharyngeal Swab  Result Value Ref Range   SARS Coronavirus 2 by RT PCR NEGATIVE NEGATIVE    Comment: (NOTE) SARS-CoV-2 target nucleic acids are NOT DETECTED. The SARS-CoV-2 RNA is generally detectable in upper respiratoy specimens during the acute phase of infection. The lowest concentration of SARS-CoV-2 viral copies this assay can detect is 131 copies/mL. A negative result does not preclude SARS-Cov-2 infection and should not be used as the sole basis for treatment or other patient management decisions. A negative result may occur with  improper specimen collection/handling, submission of specimen other than  nasopharyngeal swab, presence of viral mutation(s) within the areas targeted by this assay, and inadequate number of viral copies (<131 copies/mL). A negative result must be combined with clinical observations, patient history, and epidemiological information. The expected result is Negative. Fact Sheet for Patients:  PinkCheek.be Fact Sheet for Healthcare Providers:  GravelBags.it This test is not yet ap proved or cleared by the [place] FDA and  has been authorized for detection and/or diagnosis of SARS-CoV-2 by FDA under an Emergency Use Authorization (EUA). This EUA will remain  in effect (meaning this test can be used) for the duration of the COVID-19 declaration under Section 564(b)(1) of the Act, 21 U.S.C. section 360bbb-3(b)(1), unless the authorization is terminated or revoked sooner.    Influenza A by PCR NEGATIVE NEGATIVE   Influenza B by PCR NEGATIVE NEGATIVE    Comment: (NOTE) The Xpert Xpress SARS-CoV-2/FLU/RSV assay is intended as an aid in  the diagnosis of influenza from Nasopharyngeal swab specimens and  should not be used as a sole basis for treatment. Nasal washings and  aspirates are unacceptable for Xpert Xpress SARS-CoV-2/FLU/RSV  testing. Fact Sheet for Patients: PinkCheek.be Fact Sheet for Healthcare Providers: GravelBags.it This test is not yet approved or cleared by the [place] FDA and  has been authorized for detection and/or diagnosis of SARS-CoV-2 by  FDA under an Emergency Use Authorization (EUA). This EUA will remain  in effect (meaning this test can be used) for the duration of the  Covid-19 declaration under Section 564(b)(1) of the Act, 21  U.S.C. section 360bbb-3(b)(1), unless the authorization is  terminated or revoked. Performed at [facility], [place] [address] [place], [place] [address]   CBC with  Differential/Platelet     Status: Abnormal   Collection Time: 05/21/19 10:38 AM  Result Value Ref Range   WBC 12.5 (H) 4.0 - 10.5 K/uL   RBC 2.67 (L) 3.87 - 5.11 MIL/uL   Hemoglobin 8.2 (L) 12.0 - 15.0 g/dL   HCT 26.0 (L) 36.0 - 46.0 %   MCV 97.4 80.0 - 100.0 fL   MCH 30.7 26.0 - 34.0 pg   MCHC 31.5 30.0 - 36.0 g/dL   RDW 15.0 11.5 - 15.5 %   Platelets 172 150 - 400 K/uL   nRBC 0.0 0.0 - 0.2 %   Neutrophils Relative % 75 %   Neutro Abs 9.5 (H) 1.7 - 7.7 K/uL   Lymphocytes Relative 14 %   Lymphs Abs 1.7 0.7 - 4.0 K/uL   Monocytes Relative 10 %   Monocytes Absolute 1.2 (H) 0.1 - 1.0 K/uL   Eosinophils Relative 0 %   Eosinophils Absolute 0.0 0.0 - 0.5 K/uL   Basophils Relative 0 %   Basophils Absolute 0.0 0.0 - 0.1 K/uL   Immature Granulocytes 1 %   Abs Immature Granulocytes 0.07 0.00 - 0.07 K/uL    Comment: Performed at [facility], [place] [address],  [place], [place] [address]  Comprehensive metabolic panel     Status: Abnormal   Collection Time: 05/21/19 10:38 AM  Result Value Ref Range   Sodium 134 (L) 135 - 145 mmol/L   Potassium 4.0 3.5 - 5.1 mmol/L   Chloride 104 98 - 111 mmol/L   CO2 22 22 - 32 mmol/L   Glucose, Bld 158 (H) 70 - 99 mg/dL    Comment: Glucose reference range applies only to samples taken after fasting for at least 8 hours.   BUN 37 (H) 6 - 20 mg/dL   Creatinine, Ser 0.77 0.44 - 1.00 mg/dL   Calcium 8.7 (L) 8.9 - 10.3 mg/dL   Total Protein 6.7 6.5 - 8.1 g/dL   Albumin 1.6 (L) 3.5 - 5.0 g/dL   AST 55 (H) 15 - 41 U/L   ALT 46 (H) 0 - 44 U/L   Alkaline Phosphatase 179 (H) 38 - 126 U/L   Total Bilirubin 0.7 0.3 - 1.2 mg/dL   GFR calc non Af Amer >60 >60 mL/min   GFR calc Af Amer >60 >60 mL/min   Anion gap 8 5 - 15    Comment: Performed at [facility] [address] [place], [place] [address]  Vitamin B12     Status: None   Collection Time: 05/21/19 10:38 AM  Result Value Ref Range   Vitamin B-12 679 180 - 914 pg/mL    Comment:  (NOTE) This assay is not validated for testing neonatal or myeloproliferative syndrome specimens for Vitamin B12 levels. Performed at [facility], [place] [address], [place], [place] [address]   Iron and TIBC     Status: Abnormal   Collection Time: 05/21/19 10:38 AM  Result Value Ref Range   Iron 5 (L) 28 - 170 ug/dL   TIBC 143 (L) 250 - 450 ug/dL   Saturation Ratios 4 (L) 10.4 - 31.8 %   UIBC 138 ug/dL    Comment: Performed at [facility], [place] [address] [place], [place] [address]  Ferritin     Status: Abnormal   Collection Time: 05/21/19 10:38 AM  Result Value Ref Range   Ferritin 574 (H) 11 - 307 ng/mL    Comment: Performed at [facility], [place] [address] [place], [place] [address]  Lactic acid, plasma     Status: None   Collection Time: 05/21/19 10:38 AM  Result Value Ref Range   Lactic Acid, Venous 1.5 0.5 - 1.9 mmol/L    Comment: Performed at [facility] [address] [place], [place] [address]  Folate     Status: None   Collection Time: 05/21/19 10:38 AM  Result Value Ref Range   Folate 23.5 >5.9 ng/mL    Comment: Performed at [facility] [address], [place], [place] [address]  Reticulocytes     Status: Abnormal   Collection Time: 05/21/19 10:38 AM  Result Value Ref Range   Retic Ct Pct 1.7 0.4 - 3.1 %   RBC. 2.67 (L) 3.87 - 5.11 MIL/uL   Retic Count, Absolute 45.7 19.0 - 186.0 K/uL   Immature Retic Fract 13.0 2.3 - 15.9 %    Comment: Performed at [facility] [address] [place], [place] [address]  Type and screen     Status: None   Collection Time: 05/21/19 10:43 AM  Result Value Ref Range   ABO/RH(D) O POS    Antibody Screen NEG  Sample Expiration      05/24/2019,2359 Performed at [facility], [place] [address], [place], [place] [address]   Urinalysis, Routine w reflex microscopic     Status: Abnormal   Collection Time: 05/21/19 11:22 AM  Result Value Ref Range   Color, Urine AMBER (A) YELLOW     Comment: BIOCHEMICALS MAY BE AFFECTED BY COLOR   APPearance CLOUDY (A) CLEAR   Specific Gravity, Urine 1.017 1.005 - 1.030   pH 6.0 5.0 - 8.0   Glucose, UA NEGATIVE NEGATIVE mg/dL   Hgb urine dipstick LARGE (A) NEGATIVE   Bilirubin Urine NEGATIVE NEGATIVE   Ketones, ur NEGATIVE NEGATIVE mg/dL   Protein, ur >=300 (A) NEGATIVE mg/dL   Nitrite NEGATIVE NEGATIVE   Leukocytes,Ua LARGE (A) NEGATIVE   RBC / HPF >50 (H) 0 - 5 RBC/hpf   WBC, UA >50 (H) 0 - 5 WBC/hpf   Bacteria, UA RARE (A) NONE SEEN   Squamous Epithelial / LPF 0-5 0 - 5   WBC Clumps PRESENT    Mucus PRESENT    Budding Yeast PRESENT     Comment: Performed at [facility], [address] [place] [address], [place], [place] [address]    CT ABDOMEN PELVIS W CONTRAST  Result Date: 05/21/2019 CLINICAL DATA:  Acute generalized abdominal pain. EXAM: CT ABDOMEN AND PELVIS WITH CONTRAST TECHNIQUE: Multidetector CT imaging of the abdomen and pelvis was performed using the standard protocol following bolus administration of intravenous contrast. CONTRAST:  164mL OMNIPAQUE IOHEXOL 300 MG/ML  SOLN COMPARISON:  April 11, 2019. FINDINGS: Lower chest: Mild right pleural effusion is noted with associated atelectasis of the right lower lobe. Mild left basilar subsegmental atelectasis is noted. Hepatobiliary: Solitary gallstone is noted. No biliary dilatation is noted. The liver is unremarkable. Pancreas: Unremarkable. No pancreatic ductal dilatation or surrounding inflammatory changes. Spleen: Normal in size without focal abnormality. Adrenals/Urinary Tract: Adrenal glands and kidneys appear normal. No hydronephrosis or renal obstruction is noted. Urinary bladder is decompressed secondary to Foley catheter. Stomach/Bowel: The stomach is unremarkable. There is no evidence of bowel obstruction or inflammation. The appendix is unremarkable. Vascular/Lymphatic: No significant vascular findings are present. No enlarged abdominal or pelvic lymph nodes. Reproductive:  Uterus and bilateral adnexa are unremarkable. Other: Surgical drain is seen passing underneath the liver with distal tip inferior to the left hepatic lobe. Continued presence of pigtail catheter within complex fluid collection anteriorly in the abdomen concerning for abscess. It is significantly smaller compared to prior exam, currently measuring 7.9 x 1.5 cm. Musculoskeletal: No acute or significant osseous findings. IMPRESSION: 1. Mild right pleural effusion is noted with associated atelectasis of the right lower lobe. 2. Solitary gallstone is noted. 3. Surgical drain is seen passing underneath the liver with distal tip inferior to the left hepatic lobe. Continued presence of complex fluid collection anteriorly in the abdomen concerning for abscess. It is significantly smaller compared to prior exam, currently measuring 7.9 x 1.5 cm. Pigtail drainage catheter is unchanged in position. Electronically Signed   By: Marijo Conception M.D.   On: 05/21/2019 13:36   DG Chest Port 1 View  Result Date: 05/21/2019 CLINICAL DATA:  Weakness, altered mental status EXAM: PORTABLE CHEST 1 VIEW COMPARISON:  03/26/2019 FINDINGS: Left PICC line in place with the tip in the SVC. Low lung volumes. Bibasilar airspace opacities, right greater than left. Small right pleural effusion. Heart is mildly enlarged. No acute bony abnormality. IMPRESSION: Bibasilar atelectasis or infiltrates, right greater than left. Small right pleural  effusion. Electronically Signed   By: Rolm Baptise M.D.   On: 05/21/2019 10:45     A/P: Gina [name] is an 59 y.o. 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 -Long-term would need EGD -WOCN for Eakin assistance and wound care to BKA stumps -May benefit from Palliative care consultation for goals of care given prior discussions -We will follow with you  Sharon Mt. Dema Severin, M.D. [facility] Use [email or web address] to contact on call provider

## 2019-05-21 NOTE — ED Notes (Signed)
Pt transported to CT ?

## 2019-05-21 NOTE — ED Notes (Signed)
hospitalist at bedside

## 2019-05-21 NOTE — ED Notes (Signed)
Pt dtr called and is speaking with pt at this time.

## 2019-05-21 NOTE — Progress Notes (Signed)
PICC assessment: LUE DL PICC placed at outside facility. Site unremarkable. Blood return noted. Tip in SVC per Xray. Will need antimicrobial disc placed if admitted.

## 2019-05-21 NOTE — ED Provider Notes (Signed)
Patient has been seen and evaluated by our hospitalist colleagues.  She has been deemed appropriate for discharge, and this has been discussed, seemingly with the patient and her daughter.  Patient will have prescription of antibiotics, as recommended, be discharged, to follow-up with her physician.   Carmin Muskrat, MD 05/21/19 1807

## 2019-05-21 NOTE — ED Notes (Signed)
General sx at bedside

## 2019-05-21 NOTE — Discharge Instructions (Addendum)
Please be sure to follow up with your physician.  Return here for any concerning changes in your condition.

## 2019-05-21 NOTE — ED Notes (Signed)
Called [facility] for transport for pt.
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
## 2019-05-22 ENCOUNTER — Other Ambulatory Visit (HOSPITAL_COMMUNITY): Payer: Medicare Other

## 2019-05-22 ENCOUNTER — Other Ambulatory Visit: Payer: Self-pay

## 2019-05-22 ENCOUNTER — Inpatient Hospital Stay (HOSPITAL_COMMUNITY)
Admission: EM | Admit: 2019-05-22 | Discharge: 2019-06-10 | DRG: 314 | Disposition: A | Discharge status: Home / Self Care | Payer: Medicare Other | Attending: Internal Medicine | Admitting: Internal Medicine

## 2019-05-22 ENCOUNTER — Telehealth (HOSPITAL_BASED_OUTPATIENT_CLINIC_OR_DEPARTMENT_OTHER): Payer: Self-pay | Admitting: Emergency Medicine

## 2019-05-22 ENCOUNTER — Encounter (HOSPITAL_COMMUNITY): Payer: Self-pay

## 2019-05-22 ENCOUNTER — Telehealth: Payer: Self-pay | Admitting: *Deleted

## 2019-05-22 DIAGNOSIS — Z79899 Other long term (current) drug therapy: Secondary | ICD-10-CM

## 2019-05-22 DIAGNOSIS — L89153 Pressure ulcer of sacral region, stage 3: Secondary | ICD-10-CM | POA: Diagnosis present

## 2019-05-22 DIAGNOSIS — Z9989 Dependence on other enabling machines and devices: Secondary | ICD-10-CM | POA: Diagnosis not present

## 2019-05-22 DIAGNOSIS — R5381 Other malaise: Secondary | ICD-10-CM

## 2019-05-22 DIAGNOSIS — Z6821 Body mass index (BMI) 21.0-21.9, adult: Secondary | ICD-10-CM

## 2019-05-22 DIAGNOSIS — B9562 Methicillin resistant Staphylococcus aureus infection as the cause of diseases classified elsewhere: Secondary | ICD-10-CM | POA: Diagnosis present

## 2019-05-22 DIAGNOSIS — E1100 Type 2 diabetes mellitus with hyperosmolarity without nonketotic hyperglycemic-hyperosmolar coma (NKHHC): Secondary | ICD-10-CM | POA: Diagnosis present

## 2019-05-22 DIAGNOSIS — E860 Dehydration: Secondary | ICD-10-CM | POA: Diagnosis not present

## 2019-05-22 DIAGNOSIS — K219 Gastro-esophageal reflux disease without esophagitis: Secondary | ICD-10-CM | POA: Diagnosis present

## 2019-05-22 DIAGNOSIS — K651 Peritoneal abscess: Secondary | ICD-10-CM | POA: Diagnosis present

## 2019-05-22 DIAGNOSIS — R509 Fever, unspecified: Secondary | ICD-10-CM

## 2019-05-22 DIAGNOSIS — L89210 Pressure ulcer of right hip, unstageable: Secondary | ICD-10-CM | POA: Diagnosis present

## 2019-05-22 DIAGNOSIS — Z933 Colostomy status: Secondary | ICD-10-CM

## 2019-05-22 DIAGNOSIS — F39 Unspecified mood [affective] disorder: Secondary | ICD-10-CM | POA: Diagnosis present

## 2019-05-22 DIAGNOSIS — Z95828 Presence of other vascular implants and grafts: Secondary | ICD-10-CM | POA: Diagnosis not present

## 2019-05-22 DIAGNOSIS — Z888 Allergy status to other drugs, medicaments and biological substances status: Secondary | ICD-10-CM | POA: Diagnosis not present

## 2019-05-22 DIAGNOSIS — F209 Schizophrenia, unspecified: Secondary | ICD-10-CM | POA: Diagnosis present

## 2019-05-22 DIAGNOSIS — E43 Unspecified severe protein-calorie malnutrition: Secondary | ICD-10-CM | POA: Diagnosis present

## 2019-05-22 DIAGNOSIS — Z833 Family history of diabetes mellitus: Secondary | ICD-10-CM

## 2019-05-22 DIAGNOSIS — R1310 Dysphagia, unspecified: Secondary | ICD-10-CM | POA: Diagnosis not present

## 2019-05-22 DIAGNOSIS — R7881 Bacteremia: Secondary | ICD-10-CM | POA: Diagnosis present

## 2019-05-22 DIAGNOSIS — Z7189 Other specified counseling: Secondary | ICD-10-CM

## 2019-05-22 DIAGNOSIS — Z9889 Other specified postprocedural states: Secondary | ICD-10-CM

## 2019-05-22 DIAGNOSIS — R188 Other ascites: Secondary | ICD-10-CM | POA: Diagnosis present

## 2019-05-22 DIAGNOSIS — E1159 Type 2 diabetes mellitus with other circulatory complications: Secondary | ICD-10-CM

## 2019-05-22 DIAGNOSIS — K632 Fistula of intestine: Secondary | ICD-10-CM | POA: Diagnosis present

## 2019-05-22 DIAGNOSIS — Z8249 Family history of ischemic heart disease and other diseases of the circulatory system: Secondary | ICD-10-CM

## 2019-05-22 DIAGNOSIS — E1169 Type 2 diabetes mellitus with other specified complication: Secondary | ICD-10-CM | POA: Diagnosis present

## 2019-05-22 DIAGNOSIS — Z91030 Bee allergy status: Secondary | ICD-10-CM | POA: Diagnosis not present

## 2019-05-22 DIAGNOSIS — Z89519 Acquired absence of unspecified leg below knee: Secondary | ICD-10-CM

## 2019-05-22 DIAGNOSIS — I340 Nonrheumatic mitral (valve) insufficiency: Secondary | ICD-10-CM | POA: Diagnosis not present

## 2019-05-22 DIAGNOSIS — D638 Anemia in other chronic diseases classified elsewhere: Secondary | ICD-10-CM | POA: Diagnosis present

## 2019-05-22 DIAGNOSIS — R7401 Elevation of levels of liver transaminase levels: Secondary | ICD-10-CM | POA: Diagnosis present

## 2019-05-22 DIAGNOSIS — Z794 Long term (current) use of insulin: Secondary | ICD-10-CM | POA: Diagnosis not present

## 2019-05-22 DIAGNOSIS — R109 Unspecified abdominal pain: Secondary | ICD-10-CM | POA: Diagnosis not present

## 2019-05-22 DIAGNOSIS — E872 Acidosis: Secondary | ICD-10-CM | POA: Diagnosis not present

## 2019-05-22 DIAGNOSIS — R8271 Bacteriuria: Secondary | ICD-10-CM | POA: Diagnosis present

## 2019-05-22 DIAGNOSIS — Z8042 Family history of malignant neoplasm of prostate: Secondary | ICD-10-CM

## 2019-05-22 DIAGNOSIS — T80211A Bloodstream infection due to central venous catheter, initial encounter: Secondary | ICD-10-CM | POA: Diagnosis present

## 2019-05-22 DIAGNOSIS — Z515 Encounter for palliative care: Secondary | ICD-10-CM

## 2019-05-22 DIAGNOSIS — E11649 Type 2 diabetes mellitus with hypoglycemia without coma: Secondary | ICD-10-CM | POA: Diagnosis present

## 2019-05-22 DIAGNOSIS — E119 Type 2 diabetes mellitus without complications: Secondary | ICD-10-CM

## 2019-05-22 DIAGNOSIS — Z89511 Acquired absence of right leg below knee: Secondary | ICD-10-CM | POA: Diagnosis not present

## 2019-05-22 DIAGNOSIS — R197 Diarrhea, unspecified: Secondary | ICD-10-CM | POA: Diagnosis not present

## 2019-05-22 DIAGNOSIS — R64 Cachexia: Secondary | ICD-10-CM | POA: Diagnosis present

## 2019-05-22 DIAGNOSIS — I70261 Atherosclerosis of native arteries of extremities with gangrene, right leg: Secondary | ICD-10-CM | POA: Diagnosis present

## 2019-05-22 DIAGNOSIS — L89890 Pressure ulcer of other site, unstageable: Secondary | ICD-10-CM | POA: Diagnosis present

## 2019-05-22 DIAGNOSIS — E440 Moderate protein-calorie malnutrition: Secondary | ICD-10-CM | POA: Insufficient documentation

## 2019-05-22 DIAGNOSIS — R630 Anorexia: Secondary | ICD-10-CM | POA: Diagnosis present

## 2019-05-22 DIAGNOSIS — L89152 Pressure ulcer of sacral region, stage 2: Secondary | ICD-10-CM | POA: Diagnosis present

## 2019-05-22 DIAGNOSIS — Z66 Do not resuscitate: Secondary | ICD-10-CM

## 2019-05-22 DIAGNOSIS — E1152 Type 2 diabetes mellitus with diabetic peripheral angiopathy with gangrene: Secondary | ICD-10-CM | POA: Diagnosis present

## 2019-05-22 DIAGNOSIS — Z20822 Contact with and (suspected) exposure to covid-19: Secondary | ICD-10-CM | POA: Diagnosis present

## 2019-05-22 DIAGNOSIS — Z89512 Acquired absence of left leg below knee: Secondary | ICD-10-CM | POA: Diagnosis not present

## 2019-05-22 DIAGNOSIS — I10 Essential (primary) hypertension: Secondary | ICD-10-CM | POA: Diagnosis present

## 2019-05-22 DIAGNOSIS — N393 Stress incontinence (female) (male): Secondary | ICD-10-CM | POA: Diagnosis present

## 2019-05-22 DIAGNOSIS — E1165 Type 2 diabetes mellitus with hyperglycemia: Secondary | ICD-10-CM | POA: Diagnosis not present

## 2019-05-22 DIAGNOSIS — L89892 Pressure ulcer of other site, stage 2: Secondary | ICD-10-CM | POA: Diagnosis present

## 2019-05-22 HISTORY — DX: Acquired absence of right leg below knee: Z89.511

## 2019-05-22 HISTORY — DX: Chronic or unspecified gastric ulcer with perforation: K25.5

## 2019-05-22 LAB — SARS CORONAVIRUS 2 (TAT 6-24 HRS): SARS Coronavirus 2: NEGATIVE

## 2019-05-22 LAB — GLUCOSE, CAPILLARY: Glucose-Capillary: 85 mg/dL (ref 70–99)

## 2019-05-22 LAB — CBC
HCT: 24.1 % — ABNORMAL LOW (ref 36.0–46.0)
Hemoglobin: 7.6 g/dL — ABNORMAL LOW (ref 12.0–15.0)
MCH: 29.8 pg (ref 26.0–34.0)
MCHC: 31.5 g/dL (ref 30.0–36.0)
MCV: 94.5 fL (ref 80.0–100.0)
Platelets: 186 10*3/uL (ref 150–400)
RBC: 2.55 MIL/uL — ABNORMAL LOW (ref 3.87–5.11)
RDW: 14.9 % (ref 11.5–15.5)
WBC: 11.9 10*3/uL — ABNORMAL HIGH (ref 4.0–10.5)
nRBC: 0 % (ref 0.0–0.2)

## 2019-05-22 LAB — BLOOD CULTURE ID PANEL (REFLEXED)

## 2019-05-22 LAB — CBG MONITORING, ED: Glucose-Capillary: 220 mg/dL — ABNORMAL HIGH (ref 70–99)

## 2019-05-22 LAB — COMPREHENSIVE METABOLIC PANEL
ALT: 56 U/L — ABNORMAL HIGH (ref 0–44)
AST: 42 U/L — ABNORMAL HIGH (ref 15–41)
Albumin: 1.7 g/dL — ABNORMAL LOW (ref 3.5–5.0)
Alkaline Phosphatase: 181 U/L — ABNORMAL HIGH (ref 38–126)
Anion gap: 9 (ref 5–15)
BUN: 46 mg/dL — ABNORMAL HIGH (ref 6–20)
CO2: 25 mmol/L (ref 22–32)
Calcium: 8.6 mg/dL — ABNORMAL LOW (ref 8.9–10.3)
Chloride: 101 mmol/L (ref 98–111)
Creatinine, Ser: 0.88 mg/dL (ref 0.44–1.00)
GFR calc Af Amer: >60 mL/min (ref >60)
GFR calc non Af Amer: >60 mL/min (ref >60)
Glucose, Bld: 243 mg/dL — ABNORMAL HIGH (ref 70–99)
Potassium: 4.3 mmol/L (ref 3.5–5.1)
Sodium: 135 mmol/L (ref 135–145)
Total Bilirubin: 1.3 mg/dL — ABNORMAL HIGH (ref 0.3–1.2)
Total Protein: 7 g/dL (ref 6.5–8.1)

## 2019-05-22 LAB — LACTIC ACID, PLASMA: Lactic Acid, Venous: 1.2 mmol/L (ref 0.5–1.9)

## 2019-05-22 MED ORDER — ACETAMINOPHEN 650 MG RE SUPP: 650.0000 mg | Freq: Four times a day (QID) | RECTAL | Status: DC | PRN

## 2019-05-22 MED ORDER — SODIUM CHLORIDE 0.9 % IV SOLN: INTRAVENOUS | Status: AC

## 2019-05-22 MED ORDER — GABAPENTIN 300 MG PO CAPS
300.0000 mg | ORAL_CAPSULE | Freq: Two times a day (BID) | ORAL | Status: DC
  Administered 2019-05-22 – 2019-06-10 (×37): 300 mg via ORAL
  Filled 2019-05-22 (×39): qty 1

## 2019-05-22 MED ORDER — ONDANSETRON HCL 4 MG/2ML IJ SOLN: 4.0000 mg | Freq: Four times a day (QID) | INTRAMUSCULAR | Status: DC | PRN

## 2019-05-22 MED ORDER — PANTOPRAZOLE SODIUM 40 MG PO TBEC
40.0000 mg | DELAYED_RELEASE_TABLET | Freq: Every day | ORAL | Status: DC
  Administered 2019-05-23 – 2019-06-10 (×19): 40 mg via ORAL
  Filled 2019-05-22 (×19): qty 1

## 2019-05-22 MED ORDER — OXYCODONE HCL 5 MG PO TABS
5.0000 mg | ORAL_TABLET | Freq: Four times a day (QID) | ORAL | Status: DC | PRN
  Administered 2019-05-22 – 2019-06-08 (×17): 5 mg via ORAL
  Filled 2019-05-22 (×22): qty 1

## 2019-05-22 MED ORDER — AMLODIPINE BESYLATE 5 MG PO TABS
5.0000 mg | ORAL_TABLET | Freq: Every day | ORAL | Status: DC
  Administered 2019-05-23 – 2019-06-10 (×19): 5 mg via ORAL
  Filled 2019-05-22 (×19): qty 1

## 2019-05-22 MED ORDER — INSULIN ASPART 100 UNIT/ML ~~LOC~~ SOLN
0.0000 [IU] | SUBCUTANEOUS | Status: DC
  Administered 2019-05-22: 3 [IU] via SUBCUTANEOUS

## 2019-05-22 MED ORDER — ESCITALOPRAM OXALATE 10 MG PO TABS
10.0000 mg | ORAL_TABLET | Freq: Every day | ORAL | Status: DC
  Administered 2019-05-23 – 2019-06-10 (×19): 10 mg via ORAL
  Filled 2019-05-22 (×19): qty 1

## 2019-05-22 MED ORDER — VANCOMYCIN HCL 1250 MG/250ML IV SOLN
1250.0000 mg | INTRAVENOUS | Status: DC
  Administered 2019-05-22: 1250 mg via INTRAVENOUS
  Filled 2019-05-22 (×2): qty 250

## 2019-05-22 MED ORDER — INSULIN ASPART 100 UNIT/ML ~~LOC~~ SOLN: 0.0000 [IU] | Freq: Three times a day (TID) | SUBCUTANEOUS | Status: DC

## 2019-05-22 MED ORDER — ENOXAPARIN SODIUM 40 MG/0.4ML ~~LOC~~ SOLN
40.0000 mg | SUBCUTANEOUS | Status: DC
  Administered 2019-05-22 – 2019-06-09 (×16): 40 mg via SUBCUTANEOUS
  Filled 2019-05-22 (×17): qty 0.4

## 2019-05-22 MED ORDER — INSULIN GLARGINE 100 UNIT/ML ~~LOC~~ SOLN
10.0000 [IU] | Freq: Every day | SUBCUTANEOUS | Status: DC
  Filled 2019-05-22 (×2): qty 0.1

## 2019-05-22 MED ORDER — ACETAMINOPHEN 325 MG PO TABS
650.0000 mg | ORAL_TABLET | Freq: Four times a day (QID) | ORAL | Status: DC | PRN
  Administered 2019-05-22 – 2019-05-28 (×2): 650 mg via ORAL
  Filled 2019-05-22 (×3): qty 2

## 2019-05-22 MED ORDER — ONDANSETRON HCL 4 MG PO TABS: 4.0000 mg | ORAL_TABLET | Freq: Four times a day (QID) | ORAL | Status: DC | PRN

## 2019-05-22 MED ORDER — METOPROLOL TARTRATE 50 MG PO TABS
50.0000 mg | ORAL_TABLET | Freq: Two times a day (BID) | ORAL | Status: DC
  Administered 2019-05-22 – 2019-06-10 (×37): 50 mg via ORAL
  Filled 2019-05-22 (×38): qty 1

## 2019-05-22 MED ORDER — SIMETHICONE 80 MG PO CHEW
80.0000 mg | CHEWABLE_TABLET | Freq: Four times a day (QID) | ORAL | Status: DC
  Administered 2019-05-22 – 2019-06-10 (×66): 80 mg via ORAL
  Filled 2019-05-22 (×69): qty 1

## 2019-05-22 MED ORDER — MELATONIN 3 MG PO TABS
3.0000 mg | ORAL_TABLET | Freq: Every day | ORAL | Status: DC
  Administered 2019-05-23 – 2019-06-09 (×16): 3 mg via ORAL
  Filled 2019-05-22 (×21): qty 1

## 2019-05-22 NOTE — ED Notes (Signed)
This RN requested this patients medication be sent to this RN

## 2019-05-22 NOTE — Progress Notes (Signed)
Pharmacy Antibiotic Note  Gina [name] is a 59 y.o. female admitted on 05/22/2019 with bacteremia.  Pharmacy has been consulted for Vancomycin dosing.    Temp (24hrs), Avg:99.2 F (37.3 C), Min:99.2 F (37.3 C), Max:99.2 F (37.3 C)  Recent Labs  Lab 05/21/19 1038  WBC 12.5*  CREATININE 0.77  LATICACIDVEN 1.5    Estimated Creatinine Clearance: 60.6 mL/min (by C-G formula based on SCr of 0.77 mg/dL).    Allergies  Allergen Reactions  . Bee Venom Swelling    Swells up with bee stings   . Metformin And Related Other (See Comments)    Upset stomach    Antimicrobials this admission: 3/10 Vancomycin >>    Dose adjustments this admission:   Microbiology results: 3/10 Bcx  >> MRSA   Plan :  - Patient received Vancomycin 1000mg  IV x 1 dose on 3/10  - Will start Vancomycin 1250mg  IV q24h  - Est Calc AUC 574 (targeting higher dosing for bacteremia and AUC still < 600)  - Monitor patients renal function and urine output.    Thank you for allowing pharmacy to be a part of this patient's care.  Duanne Limerick PharmD. BCPS 05/22/2019 1:54 PM

## 2019-05-22 NOTE — ED Notes (Signed)
This RN called [employer] to send supplies to change osotomy pouch and dressing. Will assess need to change at time of materials arrival

## 2019-05-22 NOTE — H&P (Signed)
History and Physical  Patient Name: Gina [name]     ZOX:[id number]    DOB: 06-Aug-1960    DOA: 05/22/2019 PCP: [name], MD  Patient coming from: [facility]  Chief Complaint: Positive blood cultures      HPI: Gina [name] is a 59 y.o. F with hx DM, HTN, bilateral BKA, schizophrenia, and recent perforated ulcer leading to septic shock, DKA, fungemia, and persistent peritoneal abscess who presents with malaise, found to have MRSA bacteremia.  Patient admitted in Nov 2020 for septic shock, found to have acute peritonitis, underwent exploratory laparotomy with [name] patch on 11/23.  Subsequent blood culture showed Candida glabrata, peritoneal fluid culture showed Candida albicans.  Subsequent IR drainage of left lower quadrant peritoneal fluid collection showed Candida glabrata, Enterococcus faecalis, stenotrophomonas.  [facility]actrim, and anidulafungin for total 6 weeks.  She was discharged from [facility] to [facility], where she has been for last three months.  In the last several days, she has had vague sleepiness, malaise, anorexia, and low-grade fever, and so labs were drawn yesterday that showed leukocytosis, so she was sent to the ER.  In the ER, her exam was unremarkable, so she was discharged back to SNF.  Overnight blood cultures returned 2/2 MRSA.  ER, temp 99.25F, heart rate and blood pressure normal.  SPO2 normal on room air.  Vancomycin was ordered in the hospital service were asked to evaluate for MRSA bacteremia.             ROS: Review of Systems  Constitutional: Positive for fever, malaise/fatigue and weight loss.  Gastrointestinal: Positive for abdominal pain (chronic for several months, no recent change).  Genitourinary: Negative for dysuria, frequency, hematuria and urgency.  All other systems reviewed and are negative.         Past Medical History:  Diagnosis Date  . Diabetes mellitus   .  Hypertension   . Osteomyelitis of right foot ([place]) 02/22/2017  . Perforated gastric ulcer ([place])   . S/P bilateral BKA (below knee amputation) ([place])   . Schizophrenia ([place])   . Schizophrenia ([place])   . Septic arthritis of interphalangeal joint of toe of left foot ([place]) 06/02/2016  . Stress incontinence 04/26/2017    Past Surgical History:  Procedure Laterality Date  . AMPUTATION Left 06/05/2016   Procedure: LEFT FOOT TRANSMETATARSAL AMPUTATION;  Surgeon: [name], MD;  Location: [facility];  Service: Orthopedics;  Laterality: Left;  . AMPUTATION Right 11/11/2017   Procedure: AMPUTATION BELOW KNEE;  Surgeon: [name], MD;  Location: [place];  Service: Orthopedics;  Laterality: Right;  . AMPUTATION Left 05/10/2018   Procedure: LEFT BELOW KNEE AMPUTATION;  Surgeon: [name], MD;  Location: [place];  Service: Orthopedics;  Laterality: Left;  . AMPUTATION Left 12/28/2018   Procedure: AMPUTATION THIRD DIGIT;  Surgeon: Iran Planas, MD;  Location: [facility];  Service: Orthopedics;  Laterality: Left;  . AMPUTATION TOE Right 02/23/2017   Procedure: AMPUTATION RIGHT THIRD TOE;  Surgeon: [name], MD;  Location: [place];  Service: Orthopedics;  Laterality: Right;  . I & D EXTREMITY Right 11/09/2017   Procedure: Debride Ulcer Right Heel;  Surgeon: [name], MD;  Location: [place];  Service: Orthopedics;  Laterality: Right;  . I & D EXTREMITY Left 12/28/2018   Procedure: IRRIGATION AND DEBRIDEMENT EXTREMITY;  Surgeon: Iran Planas, MD;  Location: [facility];  Service: Orthopedics;  Laterality: Left;  . INCISION AND DRAINAGE  OF WOUND Left 12/26/2018   Procedure: IRRIGATION AND DEBRIDEMENT LEFT HAND;  Surgeon: Iran Planas, MD;  [facility];  Service: Orthopedics;  Laterality: Left;  . LAPAROTOMY N/A 02/02/2019   Procedure: EXPLORATORY LAPAROTOMY WITH OVERSEW OF DUODENAL ULCER;  Surgeon: Alphonsa Overall, MD;  Location: [facility];  Service: General;  Laterality: N/A;  . TUBAL LIGATION       Social History: Patient lived with her son prior to last November, is currently SNF bound.  The patient was able to transfer to whelelchair since her second BKA, prior to last Nov.  Nonsmoker.  Allergies  Allergen Reactions  . Bee Venom Swelling    Swells up with bee stings   . Metformin And Related Other (See Comments)    Upset stomach    Family history: family history includes CAD in her father; Diabetes Mellitus II in her brother; Diabetes type II in her father; Prostate cancer in her father.  Prior to Admission medications   Medication Sig Start Date End Date Taking? Authorizing Provider  amLODipine (NORVASC) 5 MG tablet Take 1 tablet (5 mg total) by mouth daily. 11/29/18 05/22/19 Yes Spongberg, Audie Pinto, MD  ascorbic acid (VITAMIN C) 500 MG tablet Take 500 mg by mouth 2 (two) times daily.   Yes [provider]  escitalopram (LEXAPRO) 10 MG tablet Take 10 mg by mouth daily.   Yes [provider]  gabapentin (NEURONTIN) 300 MG capsule Take 300 mg by mouth 2 (two) times daily.   Yes [provider]  insulin aspart (NOVOLOG) 100 UNIT/ML injection Inject 0-20 Units into the skin every 6 (six) hours. Patient taking differently: Inject 1-9 Units into the skin See admin instructions. Inject 1-9 units into the skin three times a day with meals, PER SLIDING SCALE: BGL 101-150 = 1 unit; 151-200 = 2 units; 201-250 = 3 units; 251-300 = 5 units; 301-350 = 7 units; >350 = 9 units; >400 or <60 = CALL MD 03/10/19  Yes Gherghe, Vella Redhead, MD  LANTUS SOLOSTAR 100 UNIT/ML Solostar Pen Inject 10 Units into the skin at bedtime.  03/11/19  Yes [provider]  Melatonin 3 MG TABS Take 3 mg by mouth at bedtime.   Yes [provider]  metoprolol tartrate (LOPRESSOR) 50 MG tablet Take 50 mg by mouth 2 (two) times daily.   Yes [provider]  oxyCODONE (OXY IR/ROXICODONE) 5 MG immediate release tablet Take 5 mg by mouth every 6 (six) hours as needed (or  pain).    Yes [provider]  pantoprazole (PROTONIX) 40 MG tablet Take 40 mg by mouth daily.   Yes [provider]  simethicone (MYLICON) 80 MG chewable tablet Chew 80 mg by mouth 4 (four) times daily.   Yes [provider]  sodium chloride (OCEAN) 0.65 % SOLN nasal spray Place 1 spray into both nostrils every 4 (four) hours as needed (dry nasal membranes).   Yes [provider]       Physical Exam: BP (!) 142/81   Pulse 76   Temp 99.1 F (37.3 C) (Rectal)   Resp 16   LMP 03/29/2015   SpO2 95%  General appearance: Chronically ill-appearing elderly adult female, alert and in no acute distress, lying in bed, interactive, surrounded by various and sundry bodily fluid drainage bags.   Eyes: Anicteric, conjunctiva pink, lids and lashes normal. PERRL.    ENT: No nasal deformity, discharge, epistaxis.  Hearing normal. OP moist without lesions.  Lips normal, dentition, mostly  edentulous. Neck: No neck masses.  Trachea midline.  No thyromegaly/tenderness. Lymph: No cervical or supraclavicular lymphadenopathy. Skin: Warm and dry.  No jaundice.  No suspicious rashes or lesions.  Bilateral stumps have some sloughing of the skin, without cellulitis. Cardiac: RRR, nl S1-S2, no murmurs appreciated.  Capillary refill is brisk.  JVP normal.  No LE.  Radial pulses 2+ and symmetric. Respiratory: Normal respiratory rate and rhythm.  CTAB without rales or wheezes. Abdomen: Abdomen soft.  Mild nonfocal TTP without guarding.  Ostomy bag in place. No ascites, distension, hepatosplenomegaly.   MSK: No deformities or effusions of the large joints of the upper or lower extremities bilaterally.  No cyanosis or clubbing.  Bilateral BKA.  Diffuse loss of subcutaneous muscle mass of back. Neuro: Cranial nerves 3 through 12 intact.  Sensation intact to light touch. Speech is fluent.  Muscle strength 5/5.    Psych: Sensorium intact and responding to questions, attention normal.   Behavior appropriate.  Affect normal.  Judgment and insight appear normal.     Labs on Admission:  I have personally reviewed following labs and imaging studies: CBC: Recent Labs  Lab 05/21/19 1038  WBC 12.5*  NEUTROABS 9.5*  HGB 8.2*  HCT 26.0*  MCV 97.4  PLT 419   Basic Metabolic Panel: Recent Labs  Lab 05/21/19 1038  NA 134*  K 4.0  CL 104  CO2 22  GLUCOSE 158*  BUN 37*  CREATININE 0.77  CALCIUM 8.7*   GFR: Estimated Creatinine Clearance: 60.6 mL/min (by C-G formula based on SCr of 0.77 mg/dL).  Liver Function Tests: Recent Labs  Lab 05/21/19 1038  AST 55*  ALT 46*  ALKPHOS 179*  BILITOT 0.7  PROT 6.7  ALBUMIN 1.6*   Anemia Panel: Recent Labs    05/21/19 1038  VITAMINB12 679  FOLATE 23.5  FERRITIN 574*  TIBC 143*  IRON 5*  RETICCTPCT 1.7   Sepsis Labs:  Recent Results (from the past 240 hour(s))  Urine Culture     Status: Abnormal (Preliminary result)   Collection Time: 05/21/19 10:00 AM   Specimen: Urine, Catheterized  Result Value Ref Range Status   Specimen Description URINE, CATHETERIZED  Final   Special Requests NONE  Final   Culture (A)  Final    >=100,000 COLONIES/mL PSEUDOMONAS AERUGINOSA CULTURE REINCUBATED FOR BETTER GROWTH SUSCEPTIBILITIES TO FOLLOW Performed at [facility], [place] [address], [place], [place] [address]    Report Status PENDING  Incomplete  Respiratory Panel by RT PCR (Flu A&B, Covid) - Nasopharyngeal Swab     Status: None   Collection Time: 05/21/19 10:03 AM   Specimen: Nasopharyngeal Swab  Result Value Ref Range Status   SARS Coronavirus 2 by RT PCR NEGATIVE NEGATIVE Final    Comment: (NOTE) SARS-CoV-2 target nucleic acids are NOT DETECTED. The SARS-CoV-2 RNA is generally detectable in upper respiratoy specimens during the acute phase of infection. The lowest concentration of SARS-CoV-2 viral copies this assay can detect is 131 copies/mL. A negative result does not preclude SARS-Cov-2 infection and  should not be used as the sole basis for treatment or other patient management decisions. A negative result may occur with  improper specimen collection/handling, submission of specimen other than nasopharyngeal swab, presence of viral mutation(s) within the areas targeted by this assay, and inadequate number of viral copies (<131 copies/mL). A negative result must be combined with clinical observations, patient history, and epidemiological information. The expected result is Negative. Fact Sheet for Patients:  PinkCheek.be Fact  Sheet for Healthcare Providers:  GravelBags.it This test is not yet ap proved or cleared by the [place] FDA and  has been authorized for detection and/or diagnosis of SARS-CoV-2 by FDA under an Emergency Use Authorization (EUA). This EUA will remain  in effect (meaning this test can be used) for the duration of the COVID-19 declaration under Section 564(b)(1) of the Act, 21 U.S.C. section 360bbb-3(b)(1), unless the authorization is terminated or revoked sooner.    Influenza A by PCR NEGATIVE NEGATIVE Final   Influenza B by PCR NEGATIVE NEGATIVE Final    Comment: (NOTE) The Xpert Xpress SARS-CoV-2/FLU/RSV assay is intended as an aid in  the diagnosis of influenza from Nasopharyngeal swab specimens and  should not be used as a sole basis for treatment. Nasal washings and  aspirates are unacceptable for Xpert Xpress SARS-CoV-2/FLU/RSV  testing. Fact Sheet for Patients: PinkCheek.be Fact Sheet for Healthcare Providers: GravelBags.it This test is not yet approved or cleared by the [place] FDA and  has been authorized for detection and/or diagnosis of SARS-CoV-2 by  FDA under an Emergency Use Authorization (EUA). This EUA will remain  in effect (meaning this test can be used) for the duration of the  Covid-19 declaration under Section  564(b)(1) of the Act, 21  U.S.C. section 360bbb-3(b)(1), unless the authorization is  terminated or revoked. Performed at [facility], [place] [address], [place], [place] [address]   Blood culture (routine x 2)     Status: None (Preliminary result)   Collection Time: 05/21/19 10:38 AM   Specimen: BLOOD RIGHT ARM  Result Value Ref Range Status   Specimen Description BLOOD RIGHT ARM  Final   Special Requests   Final    AEROBIC BOTTLE ONLY Blood Culture results may not be optimal due to an inadequate volume of blood received in culture bottles   Culture  Setup Time   Final    GRAM POSITIVE COCCI IN CLUSTERS AEROBIC BOTTLE ONLY CRITICAL RESULT CALLED TO, READ BACK BY AND VERIFIED WITH: Macky Lower RN, AT [phone] 05/22/19 BY Rush Landmark Performed at [facility], [place] [address], [place], [place] [address]    Culture GRAM POSITIVE COCCI  Final   Report Status PENDING  Incomplete  Blood culture (routine x 2)     Status: None (Preliminary result)   Collection Time: 05/21/19 10:38 AM   Specimen: BLOOD RIGHT HAND  Result Value Ref Range Status   Specimen Description BLOOD RIGHT HAND  Final   Special Requests   Final    AEROBIC BOTTLE ONLY Blood Culture results may not be optimal due to an inadequate volume of blood received in culture bottles   Culture  Setup Time   Final    AEROBIC BOTTLE ONLY GRAM POSITIVE COCCI IN CLUSTERS CRITICAL RESULT CALLED TO, READ BACK BY AND VERIFIED WITH: K NEAL RN 05/22/19 0137 JDW    Culture   Final    NO GROWTH < 24 HOURS Performed at [facility], [place] [address], [place],  [address]    Report Status PENDING  Incomplete  Blood Culture ID Panel (Reflexed)     Status: Abnormal   Collection Time: 05/21/19 10:38 AM  Result Value Ref Range Status   Enterococcus species NOT DETECTED NOT DETECTED Final   Listeria monocytogenes NOT DETECTED NOT DETECTED Final   Staphylococcus species DETECTED (A) NOT DETECTED Final    Comment: CRITICAL RESULT  CALLED TO, READ BACK BY AND VERIFIED WITH: S. COBLE  RN, AT [phone] 05/22/19 BY D. VANHOOK    Staphylococcus aureus (BCID) DETECTED (A) NOT DETECTED Final    Comment: Methicillin (oxacillin)-resistant Staphylococcus aureus (MRSA). MRSA is predictably resistant to beta-lactam antibiotics (except ceftaroline). Preferred therapy is vancomycin unless clinically contraindicated. Patient requires contact precautions if  hospitalized. CRITICAL RESULT CALLED TO, READ BACK BY AND VERIFIED WITH: S. COBLE RN, AT [phone] 05/22/19 BY D. VANHOOK    Methicillin resistance DETECTED (A) NOT DETECTED Final    Comment: CRITICAL RESULT CALLED TO, READ BACK BY AND VERIFIED WITH: S. COBLE RN, AT [phone] 05/22/19 BY D. VANHOOK    Streptococcus species NOT DETECTED NOT DETECTED Final   Streptococcus agalactiae NOT DETECTED NOT DETECTED Final   Streptococcus pneumoniae NOT DETECTED NOT DETECTED Final   Streptococcus pyogenes NOT DETECTED NOT DETECTED Final   Acinetobacter baumannii NOT DETECTED NOT DETECTED Final   Enterobacteriaceae species NOT DETECTED NOT DETECTED Final   Enterobacter cloacae complex NOT DETECTED NOT DETECTED Final   Escherichia coli NOT DETECTED NOT DETECTED Final   Klebsiella oxytoca NOT DETECTED NOT DETECTED Final   Klebsiella pneumoniae NOT DETECTED NOT DETECTED Final   Proteus species NOT DETECTED NOT DETECTED Final   Serratia marcescens NOT DETECTED NOT DETECTED Final   Haemophilus influenzae NOT DETECTED NOT DETECTED Final   Neisseria meningitidis NOT DETECTED NOT DETECTED Final   Pseudomonas aeruginosa NOT DETECTED NOT DETECTED Final   Candida albicans NOT DETECTED NOT DETECTED Final   Candida glabrata NOT DETECTED NOT DETECTED Final   Candida krusei NOT DETECTED NOT DETECTED Final   Candida parapsilosis NOT DETECTED NOT DETECTED Final   Candida tropicalis NOT DETECTED NOT DETECTED Final    Comment: Performed at [facility], [place]. [address], [place], [place] [address]            Radiological Exams on Admission: Personally reviewed CT abdomen pelvis from yesterday showing smaller abdominal fluid collection; chest x-ray report from yesterday showing atelectasis only, no pneumonia reviewed: CT ABDOMEN PELVIS W CONTRAST  Result Date: 05/21/2019 CLINICAL DATA:  Acute generalized abdominal pain. EXAM: CT ABDOMEN AND PELVIS WITH CONTRAST TECHNIQUE: Multidetector CT imaging of the abdomen and pelvis was performed using the standard protocol following bolus administration of intravenous contrast. CONTRAST:  15mL OMNIPAQUE IOHEXOL 300 MG/ML  SOLN COMPARISON:  April 11, 2019. FINDINGS: Lower chest: Mild right pleural effusion is noted with associated atelectasis of the right lower lobe. Mild left basilar subsegmental atelectasis is noted. Hepatobiliary: Solitary gallstone is noted. No biliary dilatation is noted. The liver is unremarkable. Pancreas: Unremarkable. No pancreatic ductal dilatation or surrounding inflammatory changes. Spleen: Normal in size without focal abnormality. Adrenals/Urinary Tract: Adrenal glands and kidneys appear normal. No hydronephrosis or renal obstruction is noted. Urinary bladder is decompressed secondary to Foley catheter. Stomach/Bowel: The stomach is unremarkable. There is no evidence of bowel obstruction or inflammation. The appendix is unremarkable. Vascular/Lymphatic: No significant vascular findings are present. No enlarged abdominal or pelvic lymph nodes. Reproductive: Uterus and bilateral adnexa are unremarkable. Other: Surgical drain is seen passing underneath the liver with distal tip inferior to the left hepatic lobe. Continued presence of pigtail catheter within complex fluid collection anteriorly in the abdomen concerning for abscess. It is significantly smaller compared to prior exam, currently measuring 7.9 x 1.5 cm. Musculoskeletal: No acute or significant osseous findings. IMPRESSION: 1. Mild right pleural effusion is noted with associated  atelectasis of the right lower lobe. 2. Solitary gallstone is noted. 3. Surgical drain is seen passing underneath the liver  with distal tip inferior to the left hepatic lobe. Continued presence of complex fluid collection anteriorly in the abdomen concerning for abscess. It is significantly smaller compared to prior exam, currently measuring 7.9 x 1.5 cm. Pigtail drainage catheter is unchanged in position. Electronically Signed   By: Marijo Conception M.D.   On: 05/21/2019 13:36   DG Chest Port 1 View  Result Date: 05/21/2019 CLINICAL DATA:  Weakness, altered mental status EXAM: PORTABLE CHEST 1 VIEW COMPARISON:  03/26/2019 FINDINGS: Left PICC line in place with the tip in the SVC. Low lung volumes. Bibasilar airspace opacities, right greater than left. Small right pleural effusion. Heart is mildly enlarged. No acute bony abnormality. IMPRESSION: Bibasilar atelectasis or infiltrates, right greater than left. Small right pleural effusion. Electronically Signed   By: Rolm Baptise M.D.   On: 05/21/2019 10:45            Assessment/Plan   MRSA bacteremia [place] [facility] Staphylococcus aureus bacteremia    Staphylococcus aureus bacteremia (SAB) is associated with a high rate of complications and mortality.  Specific aspects of clinical management are critical to optimizing the outcome of patients with SAB.  Therefore, the [facility] Antimicrobial Management Team [facility]) has initiated an intervention aimed at improving the management of SAB at [facility].  To do so, Infectious Diseases physicians are providing an evidence-based consult for the management of all patients with SAB.       Yes No Comments  Perform follow-up blood cultures (even if the patient is afebrile) to ensure clearance of bacteremia [x]   []   Ordered for AM  Remove vascular catheter and obtain follow-up blood cultures after the removal of the catheter [x]   []   Remove PICC tonight   Perform echocardiography  to evaluate for endocarditis (transthoracic ECHO is 40-50% sensitive, TEE is > 90% sensitive) [x]   []   TTE ordered  Consult electrophysiologist to evaluate implanted cardiac device (pacemaker, ICD) []   []    N/A  Ensure source control [x]   []   PICC removal planned.  No other suspected sites (doubt peritoneal abscess at this point in time)  Investigate for "metastatic" sites of infection [x]   []   No suspected metastatic sites.   -Consult ID -Consult TOC        Severe protein calorie malnutrition As evidenced by BMI 21, loss of subcutaneous muscle mass and fat, recent partial bowel resection, and weight loss.   S/p ex-lap and partial small bowel resection Intra-abdominal abscess Abscess smaller by imaging. Low suspicion that this remains infected. -Hold TPN -Consult general surgery regarding ability to start enteral nutrition -UGI planned for tomorrow -If general surgery feel safe starting enteral nutrition, can begin tomorrow and leave out PICC -If unable to tolerate oral nutrition, or if general surgery recommend not starting enteral nutrition after UGI study tomorrow, will need to replace PICC when blood culture clears -Consult dietitian  Diabetes -Continue Lantus -Start sliding scale correction insulin every 4 hours -Continue gabapentin  Hypertension -Continue amlodipine and metoprolol  Mood disorder -Continue Escitalopram  GERD -Continue pantoprazole  Anemia of chronic disease Stable relative to baseline, no clinical bleeding.  Transaminitis Suspect from TPN -Trend LFTs  Unstageable pressure ulcer bilateral BKA stumps, POA -WOC consult  Asymptomatic bacteriuria -replace foley         DVT prophylaxis: Lovenox  Code Status: FULL, confirmed with patient  Family Communication: Called to daughter twice, no answer  Disposition Plan: Anticipate IV antibiotics, TTE, ID consult.  Likely TEE if TTE  negative.  Remove PICC.  Plan for extended course of IV  antibiotics Consults called: Infectious disease, general surgery Admission status: Inpatient      Medical decision making: Patient seen at 4:34 PM on 05/22/2019.  The patient was discussed with ED provider and general surgery.  What exists of the patient's chart was reviewed in depth and summarized above.  Clinical condition: Stable.        [facility] Please page though [place] or Epic secure chat:  For password, contact charge nurse

## 2019-05-22 NOTE — ED Triage Notes (Signed)
Per [facility] pt from [employer]. Pt sent to ED per facility physician request due to abnormal labs. Pt seen here yesterday for the same, seen and evaluated by EDP and [place] team, they decided not to admit pt send her back to facility and treat there. Dr continues to call EMS for transport to hospital.  Pt with chronic indwelling foley, LLQ JP drain, RUQ JP drain and midline ostomy, BBKA    BP 140/70 HR 76 CBG 344 98.4 98% RA

## 2019-05-22 NOTE — Progress Notes (Signed)
  Echocardiogram 2D Echocardiogram has been attempted. Physician consult in progress. Reattempt at later date.  Randa Lynn Nat Lowenthal 05/22/2019, 4:15 PM

## 2019-05-22 NOTE — Telephone Encounter (Signed)
Lab called w pos blood cultures in 2 bottles in 2 sets  Gram pos cocci in clusters   BCID  MRSA,  Discussed w Dr Melina Copa.  Will attempt to call to check on pt,  If feeling OK and no fever no other steps needed,   If fever return to [facility] for additional treatment   Attempted to call pt  Phone would ring 2 times then get busy signal    Will attempt again

## 2019-05-22 NOTE — Telephone Encounter (Signed)
Pharmacy called related to Rx: Augmentin suspension changing to tablet .Marland KitchenMarland KitchenEDCM clarified with EDP Rona Ravens) to change Rx to: tablet.

## 2019-05-22 NOTE — Telephone Encounter (Signed)
Lab called with positive blood culture results. Consulted with Dr. Florina Ou who advised not further treatment required at this time.

## 2019-05-22 NOTE — Care Plan (Signed)
Hampton Abbot RT-R spoke with ED RN who had Gina [name] in ED 53.  Told him that I had spoken with Margie Billet, PA and we were going to do the UGI w/ Small Bowel tomorrow morning and to please report to floor RN to keep her NPO after midnight.

## 2019-05-22 NOTE — ED Notes (Signed)
This RN changed this patients ostomy bag

## 2019-05-22 NOTE — Consult Note (Addendum)
Gina [name] for Infectious Disease    Date of Admission:  05/22/2019     Total days of antibiotics 1               Reason for Consult: MRSA Bacteremia     Referring Provider: Tivis Ringer / [employer] Primary Care Provider: [name], MD   ASSESSMENT:  Gina [name] is a 59 y/o female recently hospitalized in Nov/December 2020 with intra-abdominal abscess with cultures positive Enterococcus, stenotrophomonas and candida galbrata now presenting from her SNF with MRSA bacteremia. CT scan with abscess which appears smaller compared to previous exam with drain in place. She also has a left PICC line which is used for TPN which will need to be removed and have line holiday. She has wounds/ulcers on her bilateral stumps which are concerning for possible sources of infection. Will started vancomycin for MRSA bacteremia and obtain TTE. Repeat blood cultures in the morning. Recommend WOC RN evaluation for lower extremity wound care recommendations initially.   PLAN:  1. Start vancomycin for MRSA bacteremia.  2. Repeat blood cultures in the morning.  3. TTE to rule out endocarditis in the setting of bacteremia. 4. PICC line removal/holiday when able.  5. Drain management per general surgery and primary team.  6. Recommend stump evaluation by [place] RN for treatment recommendations.  7. Contact Precautions for MRSA bacteremia.   Active Problems:   Intra-abdominal fluid collection   MRSA bacteremia   HPI: Gina [name] is a 59 y.o. female with previous medical history significant for type 2 diabetes, hypertension, osteomyelitis of the right foot, schizophrenia, and stress incontinence recently seen in the emergency department on 3/10 having arrived from [place] with a chief complaint of not feeling well with occasional nosebleeds and abnormal blood work with white blood cell count of 13,000, hemoglobin 7.8, and platelets 224.  Gina [name] was previously hospitalized in  November 2020 until December 2020 for Gram negative bacteremia and requiring TPN at the time of discharge with her hospital course complicated by perforation of her duodenal ulcer status post laparotomy.  She was treated for intra-abdominal infection with cultures positive for Candida glabrata, stenotrophomonas, and Enterococcus.  She was discharged on Unasyn, anidulafungin, and Bactrim with recommendations to complete antibiotics on 1/4. She remains on TPN at present and has chronic indwelling catheter.   Gina [name] had a temperature of 99.9 F and was mildly tachycardic in the ED.  On physical exam JP drains located in the right upper quadrant and left lower quadrant with central ventral wall ostomy draining feculent material.  Abdomen was noted to be generally tender without peritoneal signs.  She is also noted to have foul-smelling exudate and necrotic appearing eschar on bilateral stumps.  Lab work significant for leukocytosis with white blood cell count of 12.5, hemoglobin 8.2, sodium 134, AST 55, ALT 46, iron level of 5, and urine with greater than 300 protein, large leukocytes, and white blood cell count greater than 50.  Chest x-ray with bibasilar atelectasis or infiltrates.  CT abdomen/pelvis with mild right pleural effusion; surgical drain passing underneath the liver with distal tip inferior to the left hepatic lobe and continued presence of complex fluid collection anteriorly to the abdomen concerning for abscess which was significantly smaller compared to prior exam now measuring 7.9 x 1.5 cm.  General surgery agreed with admission for further work-up with recommendation for UGI and consideration of allowing her to eat. Hospitalists were consulted with recommendation for return to [facility]  with liquid Augmentin and metronidazole.  In the interim blood cultures positive for MRSA and Gina [name] has returned to the ED.  She was given 1 dose of piperacillin-tazobactam and vancomycin yesterday. She  has not been feeling well recently. Currently very warm and would like the thermostat decreased. Has bilateral leg pain on occasion but similar to previous.   Review of Systems: Review of Systems  Constitutional: Negative for chills, fever and weight loss.  Respiratory: Negative for cough, shortness of breath and wheezing.   Cardiovascular: Negative for chest pain and leg swelling.  Gastrointestinal: Negative for abdominal pain, constipation, diarrhea, nausea and vomiting.  Skin: Negative for rash.     Past Medical History:  Diagnosis Date  . Diabetes mellitus   . Hypertension   . Osteomyelitis of right foot ([place]) 02/22/2017  . Schizophrenia ([place])   . Septic arthritis of interphalangeal joint of toe of left foot ([place]) 06/02/2016  . Stress incontinence 04/26/2017    Social History   Tobacco Use  . Smoking status: Never Smoker  . Smokeless tobacco: Never Used  Substance Use Topics  . Alcohol use: No    Comment: occasionally  . Drug use: No    Family History  Problem Relation Age of Onset  . Diabetes type II Father   . CAD Father   . Prostate cancer Father   . Diabetes Mellitus II Brother     Allergies  Allergen Reactions  . Bee Venom Swelling    Swells up with bee stings   . Metformin And Related Other (See Comments)    Upset stomach    OBJECTIVE: Blood pressure 133/73, pulse 76, temperature 99.1 F (37.3 C), temperature source Rectal, resp. rate 16, last menstrual period 03/29/2015, SpO2 95 %.  Physical Exam Constitutional:      General: She is not in acute distress.    Appearance: She is well-developed. She is ill-appearing. She is not toxic-appearing or diaphoretic.     Comments: Lying in bed with head of bed elevated; pleasant.   Cardiovascular:     Rate and Rhythm: Normal rate and regular rhythm.     Heart sounds: Normal heart sounds.     Comments: PICC line present left upper extremity. Site appears without evidence of infection.  Pulmonary:      Effort: Pulmonary effort is normal.     Breath sounds: Normal breath sounds.  Abdominal:     Tenderness: There is abdominal tenderness.     Comments: Right upper quadrant drain and left lower quadrant drain patent. Liquid stool in ostomy.   Musculoskeletal:     Comments: Bilateral BKA.   Skin:    General: Skin is warm and dry.     Comments: Wounds on bilateral stump. Please see previous pictures.   Neurological:     Mental Status: She is alert and oriented to person, place, and time.  Psychiatric:        Behavior: Behavior normal.        Thought Content: Thought content normal.        Judgment: Judgment normal.           Lab Results Lab Results  Component Value Date   WBC 12.5 (H) 05/21/2019   HGB 8.2 (L) 05/21/2019   HCT 26.0 (L) 05/21/2019   MCV 97.4 05/21/2019   PLT 172 05/21/2019    Lab Results  Component Value Date   CREATININE 0.77 05/21/2019   BUN 37 (H) 05/21/2019   NA 134 (L) 05/21/2019  K 4.0 05/21/2019   CL 104 05/21/2019   CO2 22 05/21/2019    Lab Results  Component Value Date   ALT 46 (H) 05/21/2019   AST 55 (H) 05/21/2019   ALKPHOS 179 (H) 05/21/2019   BILITOT 0.7 05/21/2019     Microbiology: Recent Results (from the past 240 hour(s))  Urine Culture     Status: Abnormal (Preliminary result)   Collection Time: 05/21/19 10:00 AM   Specimen: Urine, Catheterized  Result Value Ref Range Status   Specimen Description URINE, CATHETERIZED  Final   Special Requests NONE  Final   Culture (A)  Final    >=100,000 COLONIES/mL PSEUDOMONAS AERUGINOSA CULTURE REINCUBATED FOR BETTER GROWTH SUSCEPTIBILITIES TO FOLLOW Performed at [facility], [address] [place], [place] [address]    Report Status PENDING  Incomplete  Respiratory Panel by RT PCR (Flu A&B, Covid) - Nasopharyngeal Swab     Status: None   Collection Time: 05/21/19 10:03 AM   Specimen: Nasopharyngeal Swab  Result Value Ref Range Status   SARS Coronavirus 2 by RT PCR NEGATIVE  NEGATIVE Final    Comment: (NOTE) SARS-CoV-2 target nucleic acids are NOT DETECTED. The SARS-CoV-2 RNA is generally detectable in upper respiratoy specimens during the acute phase of infection. The lowest concentration of SARS-CoV-2 viral copies this assay can detect is 131 copies/mL. A negative result does not preclude SARS-Cov-2 infection and should not be used as the sole basis for treatment or other patient management decisions. A negative result may occur with  improper specimen collection/handling, submission of specimen other than nasopharyngeal swab, presence of viral mutation(s) within the areas targeted by this assay, and inadequate number of viral copies (<131 copies/mL). A negative result must be combined with clinical observations, patient history, and epidemiological information. The expected result is Negative. Fact Sheet for Patients:  PinkCheek.be Fact Sheet for Healthcare Providers:  GravelBags.it This test is not yet ap proved or cleared by the [place] FDA and  has been authorized for detection and/or diagnosis of SARS-CoV-2 by FDA under an Emergency Use Authorization (EUA). This EUA will remain  in effect (meaning this test can be used) for the duration of the COVID-19 declaration under Section 564(b)(1) of the Act, 21 U.S.C. section 360bbb-3(b)(1), unless the authorization is terminated or revoked sooner.    Influenza A by PCR NEGATIVE NEGATIVE Final   Influenza B by PCR NEGATIVE NEGATIVE Final    Comment: (NOTE) The Xpert Xpress SARS-CoV-2/FLU/RSV assay is intended as an aid in  the diagnosis of influenza from Nasopharyngeal swab specimens and  should not be used as a sole basis for treatment. Nasal washings and  aspirates are unacceptable for Xpert Xpress SARS-CoV-2/FLU/RSV  testing. Fact Sheet for Patients: PinkCheek.be Fact Sheet for Healthcare  Providers: GravelBags.it This test is not yet approved or cleared by the [place] FDA and  has been authorized for detection and/or diagnosis of SARS-CoV-2 by  FDA under an Emergency Use Authorization (EUA). This EUA will remain  in effect (meaning this test can be used) for the duration of the  Covid-19 declaration under Section 564(b)(1) of the Act, 21  U.S.C. section 360bbb-3(b)(1), unless the authorization is  terminated or revoked. Performed at [facility], [place] [address] [place], [place] [address]   Blood culture (routine x 2)     Status: None (Preliminary result)   Collection Time: 05/21/19 10:38 AM   Specimen: BLOOD RIGHT ARM  Result Value Ref Range Status   Specimen  Description BLOOD RIGHT ARM  Final   Special Requests   Final    AEROBIC BOTTLE ONLY Blood Culture results may not be optimal due to an inadequate volume of blood received in culture bottles   Culture  Setup Time   Final    GRAM POSITIVE COCCI IN CLUSTERS AEROBIC BOTTLE ONLY CRITICAL RESULT CALLED TO, READ BACK BY AND VERIFIED WITH: Macky Lower RN, AT [phone] 05/22/19 BY Rush Landmark Performed at [facility], [place] [address], [place], [place] [address]    Culture GRAM POSITIVE COCCI  Final   Report Status PENDING  Incomplete  Blood culture (routine x 2)     Status: None (Preliminary result)   Collection Time: 05/21/19 10:38 AM   Specimen: BLOOD RIGHT HAND  Result Value Ref Range Status   Specimen Description BLOOD RIGHT HAND  Final   Special Requests   Final    AEROBIC BOTTLE ONLY Blood Culture results may not be optimal due to an inadequate volume of blood received in culture bottles   Culture  Setup Time   Final    AEROBIC BOTTLE ONLY GRAM POSITIVE COCCI IN CLUSTERS CRITICAL RESULT CALLED TO, READ BACK BY AND VERIFIED WITH: K NEAL RN 05/22/19 0137 JDW    Culture   Final    NO GROWTH < 24 HOURS Performed at [facility], [place] [address] [place], [place]  [address]    Report Status PENDING  Incomplete  Blood Culture ID Panel (Reflexed)     Status: Abnormal   Collection Time: 05/21/19 10:38 AM  Result Value Ref Range Status   Enterococcus species NOT DETECTED NOT DETECTED Final   Listeria monocytogenes NOT DETECTED NOT DETECTED Final   Staphylococcus species DETECTED (A) NOT DETECTED Final    Comment: CRITICAL RESULT CALLED TO, READ BACK BY AND VERIFIED WITH: S. COBLE RN, AT [phone] 05/22/19 BY D. VANHOOK    Staphylococcus aureus (BCID) DETECTED (A) NOT DETECTED Final    Comment: Methicillin (oxacillin)-resistant Staphylococcus aureus (MRSA). MRSA is predictably resistant to beta-lactam antibiotics (except ceftaroline). Preferred therapy is vancomycin unless clinically contraindicated. Patient requires contact precautions if  hospitalized. CRITICAL RESULT CALLED TO, READ BACK BY AND VERIFIED WITH: S. COBLE RN, AT [phone] 05/22/19 BY D. VANHOOK    Methicillin resistance DETECTED (A) NOT DETECTED Final    Comment: CRITICAL RESULT CALLED TO, READ BACK BY AND VERIFIED WITH: S. COBLE RN, AT [phone] 05/22/19 BY D. VANHOOK    Streptococcus species NOT DETECTED NOT DETECTED Final   Streptococcus agalactiae NOT DETECTED NOT DETECTED Final   Streptococcus pneumoniae NOT DETECTED NOT DETECTED Final   Streptococcus pyogenes NOT DETECTED NOT DETECTED Final   Acinetobacter baumannii NOT DETECTED NOT DETECTED Final   Enterobacteriaceae species NOT DETECTED NOT DETECTED Final   Enterobacter cloacae complex NOT DETECTED NOT DETECTED Final   Escherichia coli NOT DETECTED NOT DETECTED Final   Klebsiella oxytoca NOT DETECTED NOT DETECTED Final   Klebsiella pneumoniae NOT DETECTED NOT DETECTED Final   Proteus species NOT DETECTED NOT DETECTED Final   Serratia marcescens NOT DETECTED NOT DETECTED Final   Haemophilus influenzae NOT DETECTED NOT DETECTED Final   Neisseria meningitidis NOT DETECTED NOT DETECTED Final   Pseudomonas aeruginosa NOT DETECTED NOT DETECTED Final    Candida albicans NOT DETECTED NOT DETECTED Final   Candida glabrata NOT DETECTED NOT DETECTED Final   Candida krusei NOT DETECTED NOT DETECTED Final   Candida parapsilosis NOT DETECTED NOT DETECTED Final   Candida tropicalis NOT  DETECTED NOT DETECTED Final    Comment: Performed at [facility], [place] [address], [place], [place] [address]     Terri Piedra, [facility]   05/22/2019  2:57 PM
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
## 2019-05-23 ENCOUNTER — Inpatient Hospital Stay (HOSPITAL_COMMUNITY): Payer: Medicare Other

## 2019-05-23 DIAGNOSIS — R1310 Dysphagia, unspecified: Secondary | ICD-10-CM

## 2019-05-23 DIAGNOSIS — Z66 Do not resuscitate: Secondary | ICD-10-CM

## 2019-05-23 DIAGNOSIS — R7881 Bacteremia: Secondary | ICD-10-CM

## 2019-05-23 DIAGNOSIS — Z7189 Other specified counseling: Secondary | ICD-10-CM

## 2019-05-23 DIAGNOSIS — Z515 Encounter for palliative care: Secondary | ICD-10-CM

## 2019-05-23 DIAGNOSIS — R5381 Other malaise: Secondary | ICD-10-CM

## 2019-05-23 LAB — COMPREHENSIVE METABOLIC PANEL
ALT: 46 U/L — ABNORMAL HIGH (ref 0–44)
AST: 36 U/L (ref 15–41)
Albumin: 1.6 g/dL — ABNORMAL LOW (ref 3.5–5.0)
Alkaline Phosphatase: 169 U/L — ABNORMAL HIGH (ref 38–126)
Anion gap: 12 (ref 5–15)
BUN: 45 mg/dL — ABNORMAL HIGH (ref 6–20)
CO2: 23 mmol/L (ref 22–32)
Calcium: 8.7 mg/dL — ABNORMAL LOW (ref 8.9–10.3)
Chloride: 102 mmol/L (ref 98–111)
Creatinine, Ser: 1.11 mg/dL — ABNORMAL HIGH (ref 0.44–1.00)
GFR calc Af Amer: >60 mL/min (ref >60)
GFR calc non Af Amer: 55 mL/min — ABNORMAL LOW (ref >60)
Glucose, Bld: 109 mg/dL — ABNORMAL HIGH (ref 70–99)
Potassium: 3.9 mmol/L (ref 3.5–5.1)
Sodium: 137 mmol/L (ref 135–145)
Total Bilirubin: 1.5 mg/dL — ABNORMAL HIGH (ref 0.3–1.2)
Total Protein: 6.8 g/dL (ref 6.5–8.1)

## 2019-05-23 LAB — ECHOCARDIOGRAM COMPLETE
Height: 62 in
Weight: 1925.94 oz

## 2019-05-23 LAB — CBC
HCT: 22.1 % — ABNORMAL LOW (ref 36.0–46.0)
Hemoglobin: 7.1 g/dL — ABNORMAL LOW (ref 12.0–15.0)
MCH: 30.2 pg (ref 26.0–34.0)
MCHC: 32.1 g/dL (ref 30.0–36.0)
MCV: 94 fL (ref 80.0–100.0)
Platelets: 171 10*3/uL (ref 150–400)
RBC: 2.35 MIL/uL — ABNORMAL LOW (ref 3.87–5.11)
RDW: 15.2 % (ref 11.5–15.5)
WBC: 9.7 10*3/uL (ref 4.0–10.5)
nRBC: 0 % (ref 0.0–0.2)

## 2019-05-23 LAB — GLUCOSE, CAPILLARY
Glucose-Capillary: 92 mg/dL (ref 70–99)
Glucose-Capillary: 82 mg/dL (ref 70–99)
Glucose-Capillary: 67 mg/dL — ABNORMAL LOW (ref 70–99)
Glucose-Capillary: 77 mg/dL (ref 70–99)
Glucose-Capillary: 81 mg/dL (ref 70–99)

## 2019-05-23 LAB — HEMOGLOBIN A1C
Hgb A1c MFr Bld: 5.9 % — ABNORMAL HIGH (ref 4.8–5.6)
Mean Plasma Glucose: 122.63 mg/dL

## 2019-05-23 LAB — VANCOMYCIN, RANDOM: Vancomycin Rm: 23

## 2019-05-23 MED ORDER — DEXTROSE-NACL 5-0.9 % IV SOLN: INTRAVENOUS | Status: DC

## 2019-05-23 MED ORDER — ORAL CARE MOUTH RINSE
15.0000 mL | Freq: Two times a day (BID) | OROMUCOSAL | Status: DC
  Administered 2019-05-24 – 2019-06-05 (×14): 15 mL via OROMUCOSAL

## 2019-05-23 MED ORDER — ENSURE ENLIVE PO LIQD
237.0000 mL | Freq: Three times a day (TID) | ORAL | Status: DC
  Administered 2019-05-23 – 2019-05-29 (×12): 237 mL via ORAL

## 2019-05-23 MED ORDER — INSULIN ASPART 100 UNIT/ML ~~LOC~~ SOLN
0.0000 [IU] | Freq: Four times a day (QID) | SUBCUTANEOUS | Status: DC
  Administered 2019-05-25: 2 [IU] via SUBCUTANEOUS
  Administered 2019-05-25 – 2019-05-30 (×4): 1 [IU] via SUBCUTANEOUS

## 2019-05-23 MED ORDER — IOHEXOL 300 MG/ML  SOLN
150.0000 mL | Freq: Once | INTRAMUSCULAR | Status: AC | PRN
  Administered 2019-05-23: 150 mL via ORAL

## 2019-05-23 MED ORDER — VANCOMYCIN VARIABLE DOSE PER UNSTABLE RENAL FUNCTION (PHARMACIST DOSING): Status: DC

## 2019-05-23 MED ORDER — NYSTATIN 100000 UNIT/ML MT SUSP
5.0000 mL | Freq: Four times a day (QID) | OROMUCOSAL | Status: AC
  Administered 2019-05-23 – 2019-05-30 (×22): 500000 [IU] via ORAL
  Filled 2019-05-23 (×25): qty 5

## 2019-05-23 MED ORDER — VANCOMYCIN HCL IN DEXTROSE 1-5 GM/200ML-% IV SOLN
1000.0000 mg | Freq: Once | INTRAVENOUS | Status: AC
  Administered 2019-05-23: 1000 mg via INTRAVENOUS
  Filled 2019-05-23: qty 200

## 2019-05-23 MED ORDER — PRO-STAT SUGAR FREE PO LIQD
30.0000 mL | Freq: Two times a day (BID) | ORAL | Status: DC
  Administered 2019-05-24 – 2019-05-27 (×5): 30 mL via ORAL
  Filled 2019-05-23 (×7): qty 30

## 2019-05-23 MED ORDER — CHLORHEXIDINE GLUCONATE CLOTH 2 % EX PADS
6.0000 | MEDICATED_PAD | Freq: Every day | CUTANEOUS | Status: DC
  Administered 2019-05-23 – 2019-06-10 (×16): 6 via TOPICAL

## 2019-05-23 MED ORDER — CHLORHEXIDINE GLUCONATE 0.12 % MT SOLN
15.0000 mL | Freq: Two times a day (BID) | OROMUCOSAL | Status: DC
  Administered 2019-05-23 – 2019-06-10 (×31): 15 mL via OROMUCOSAL
  Filled 2019-05-23 (×35): qty 15

## 2019-05-23 NOTE — Progress Notes (Signed)
Replaced foley catheter with new one per MD order.   Paulla Fore, RN, BSN

## 2019-05-23 NOTE — Progress Notes (Signed)
Pharmacy Antibiotic Note  Gina [name] is a 59 y.o. female called back in for admission on 05/22/2019 with MRSA bacteremia. Pharmacy has been consulted for Vancomycin dosing.  The patient is noted to be in AKI with SCr trending up to 1.11 (baseline appears to be between 0.4-0.5). A Vancomycin random level this afternoon is 23 mcg/ml - given the doses she received on 3/10 + 3/11 this puts her estimated Vanc clearance at 0.69 mcg/hr. Will re-dose this evening x 1 and follow-up with SCr trends in the AM  Plan: - Vancomycin 1g x 1 at 2200 tonight - No standing doses with AKI - f/u SCr trends in the AM - Will continue to follow renal function, culture results, LOT, and antibiotic de-escalation plans   Height: 5\' 2"  (157.5 cm) Weight: 120 lb 5.9 oz (54.6 kg) IBW/kg (Calculated) : 50.1  Temp (24hrs), Avg:98.9 F (37.2 C), Min:98.1 F (36.7 C), Max:99.2 F (37.3 C)  Recent Labs  Lab 05/21/19 1038 05/22/19 1600 05/22/19 1633 05/23/19 0534  WBC 12.5* 11.9*  --  9.7  CREATININE 0.77 0.88  --  1.11*  LATICACIDVEN 1.5  --  1.2  --     Estimated Creatinine Clearance: 43.7 mL/min (A) (by C-G formula based on SCr of 1.11 mg/dL (H)).    Allergies  Allergen Reactions  . Bee Venom Swelling    Swells up with bee stings   . Metformin And Related Other (See Comments)    Upset stomach    Antimicrobials this admission: Zosyn 3/10 x 1 Vanc 3/10 >>  Dose adjustments this admission: - 3/12 VR: 23 mcg/ml - est Vanc clearance 0.69 mcg/ml per hr, est level at 2200 ~16, will give 1g x 1, no standing doses  Microbiology results: 3/10 UCx >> PSA + E faecium - asymptomatic? F/u w/ ID 3/10 BCx >> 2/2 MRSA 3/11 COVID >> neg 3/12 BCx >>   Thank you for allowing pharmacy to be a part of this patient's care.  Alycia Rossetti, PharmD, BCPS Clinical Pharmacist Clinical phone for 05/23/2019: [phone] 05/23/2019 1:52 PM   **Pharmacist phone directory can now be found on [email or web address] ([email or web address]).  Listed  under [facility].

## 2019-05-23 NOTE — Consult Note (Signed)
[facility] Nurse ostomy follow up Attempted to see patient for ostomy consult.  Patient is not in 5M12, she is OTF. Val Riles, RN, MSN, CWOCN, CNS-BC, pager [phone]

## 2019-05-23 NOTE — Progress Notes (Signed)
The chaplain visited as a result of a consult for prayer. The patient expressed a desire to go home. As well as a desire to eat. She also mentioned that she has missed all of the holidays and is just tired of being sick. The patient is also wrestling with the question of why is this happening to her. She mentioned that she came close to giving up but believes that she still has a purpose in living. The chaplain provided supportive conversation and prayer. The chaplain will refer this patient to the unit chaplain for continued support. The chaplain will also provide a bible to the patients nurse.  Brion Aliment Chaplain Resident For questions concerning this note please contact me by pager [phone]

## 2019-05-23 NOTE — Progress Notes (Signed)
  Echocardiogram 2D Echocardiogram has been performed.  Gina [name] 05/23/2019, 9:44 AM
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
## 2019-05-23 NOTE — Consult Note (Signed)
[facility] Nurse ostomy follow up Patient receiving care in [place].  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 Stoma type/location: midline, lower abdominal fistula, currently with a rectal pouch connected to a bedside drainage bag. I have ordered small [place]akin pouches and barrier rings and placed orders for fistula care in the "ostomy care" order section.  [facility] Nurse Consult Note: Reason for Consult: BLE wounds, chronic Wound type: the most distal areas of the BKA stumps have unstageable PIs.  There are partial thickness wounds on the medial and lateral LLE consistent with stage 2 wounds. Pressure Injury POA: Yes Measurement: RLE BKA stump unstageable wound measures 4 cm x 9 cm with 90% yellow/tan slough, no odor, no drainage with palpation.  LLE BKA stump unstageable wound measures 4.5 cm x 12 cm and is approximately 90% yellow/tan slough.  Left medial leg has a 100% pink wound that measures 4.5 cm x 2 cm and is consistent with a stage 2 PI. Left lateral leg has a 100% pink wound that measures 1.2 cm x 1.2 cm and is consistent with a stage 2 PI. Dressing procedure/placement/frequency:  Cleanse wounds on BLE with soap and water. Place as many hydrocolloid dressings as possible to cover all the wounds. Change daily.

## 2019-05-23 NOTE — Progress Notes (Signed)
[facility] for Infectious Disease  Date of Admission:  05/22/2019     Total days of antibiotics 3         ASSESSMENT:  [name] has MRSA bacteremia with no evidence of endocarditis on TTE. PICC line has been removed. Repeat blood cultures are in process. Source of infection unclear at present and does not appear likely to be abdominal abscess or wounds on her bilateral stumps. Intra-abdominal drains remain in place with decrease in abscess size.  Previous PICC certainly may have been source which has since been removed. Will order TEE to rule out endocarditis. Will require prolonged therapy with vancomycin. Monitor renal function for nephrotoxicity and vancomycin monitoring per pharmacy protocol. Wound care per [facility] RN recommendations and primary team. General surgery determining if she is able to eat.   PLAN:  1. Continue current dose of vancomycin 2. Therapeutic drug monitoring of vancomycin levels through pharmacy protocol and renal function for nephrotoxicity. 3. Obtain TEE to rule out endocarditis.  4. Wound care per primary team.  5. Oral intake as appropriate per General Surgery/SLP   Principal Problem:   MRSA bacteremia Active Problems:   HTN (hypertension)   Diabetes ([place])   Severe protein-calorie malnutrition ([place])   Anemia of chronic disease   Type 2 diabetes mellitus with hyperosmolar nonketotic hyperglycemia (HCC)   Pressure injury of right thigh, unstageable (HCC)   Below-knee amputee ([place])   Atherosclerosis of native arteries of extremities with gangrene, right leg (HCC)   Intra-abdominal fluid collection   Palliative care by specialist   Goals of care, counseling/discussion   DNR (do not resuscitate)   Physical deconditioning   Dysphagia   . amLODipine  5 mg Oral Daily  . chlorhexidine  15 mL Mouth Rinse BID  . Chlorhexidine Gluconate Cloth  6 each Topical Daily  . enoxaparin (LOVENOX) injection  40 mg Subcutaneous Q24H  . escitalopram  10 mg Oral  Daily  . gabapentin  300 mg Oral BID  . insulin aspart  0-9 Units Subcutaneous Q4H  . insulin glargine  10 Units Subcutaneous QHS  . mouth rinse  15 mL Mouth Rinse q12n4p  . Melatonin  3 mg Oral QHS  . metoprolol tartrate  50 mg Oral BID  . pantoprazole  40 mg Oral Daily  . simethicone  80 mg Oral QID  . vancomycin variable dose per unstable renal function (pharmacist dosing)   Does not apply See admin instructions    SUBJECTIVE:  Afebrile overnight with no acute events. TTE completed with 55-60% EF and preserved heart valve structure and function. PICC line has been removed. Repeat blood cultures are pending. Wanting to know when and if she can eat.    Allergies  Allergen Reactions  . Bee Venom Swelling    Swells up with bee stings   . Metformin And Related Other (See Comments)    Upset stomach     Review of Systems: Review of Systems  Constitutional: Negative for chills, fever and weight loss.  Respiratory: Negative for cough, shortness of breath and wheezing.   Cardiovascular: Negative for chest pain and leg swelling.  Gastrointestinal: Negative for abdominal pain, constipation, diarrhea, nausea and vomiting.  Skin: Negative for rash.      OBJECTIVE: Vitals:   05/22/19 2100 05/23/19 0422 05/23/19 0520 05/23/19 0932  BP: 119/81  133/61 128/61  Pulse: 71  72 80  Resp: 18  16 18   Temp: 99 F (37.2 C)  98.1 F (36.7 C) 98.9  F (37.2 C)  TempSrc: Oral  Oral Oral  SpO2: 96%  99% 94%  Weight: 54.6 kg     Height:  5\' 2"  (1.575 m)     Body mass index is 22.02 kg/m.  Physical Exam Constitutional:      General: She is not in acute distress.    Appearance: She is well-developed.  Cardiovascular:     Rate and Rhythm: Normal rate and regular rhythm.     Heart sounds: Normal heart sounds.  Pulmonary:     Effort: Pulmonary effort is normal.     Breath sounds: Normal breath sounds.  Abdominal:     Comments: Drain sites are clean and dry.   Skin:    General: Skin  is warm and dry.     Comments: Bilateral stumps with foam dressings in place.   Neurological:     Mental Status: She is alert and oriented to person, place, and time.  Psychiatric:        Behavior: Behavior normal.        Thought Content: Thought content normal.        Judgment: Judgment normal.     Lab Results Lab Results  Component Value Date   WBC 9.7 05/23/2019   HGB 7.1 (L) 05/23/2019   HCT 22.1 (L) 05/23/2019   MCV 94.0 05/23/2019   PLT 171 05/23/2019    Lab Results  Component Value Date   CREATININE 1.11 (H) 05/23/2019   BUN 45 (H) 05/23/2019   NA 137 05/23/2019   K 3.9 05/23/2019   CL 102 05/23/2019   CO2 23 05/23/2019    Lab Results  Component Value Date   ALT 46 (H) 05/23/2019   AST 36 05/23/2019   ALKPHOS 169 (H) 05/23/2019   BILITOT 1.5 (H) 05/23/2019     Microbiology: Recent Results (from the past 240 hour(s))  Urine Culture     Status: Abnormal (Preliminary result)   Collection Time: 05/21/19 10:00 AM   Specimen: Urine, Catheterized  Result Value Ref Range Status   Specimen Description URINE, CATHETERIZED  Final   Special Requests NONE  Final   Culture (A)  Final    >=100,000 COLONIES/mL PSEUDOMONAS AERUGINOSA 50,000 COLONIES/mL ENTEROCOCCUS FAECIUM SUSCEPTIBILITIES TO FOLLOW Performed at [name], [address], [place], [place] [address]    Report Status PENDING  Incomplete   Organism ID, Bacteria PSEUDOMONAS AERUGINOSA (A)  Final      Susceptibility   Pseudomonas aeruginosa - MIC*    CEFTAZIDIME 4 SENSITIVE Sensitive     CIPROFLOXACIN <=0.25 SENSITIVE Sensitive     GENTAMICIN <=1 SENSITIVE Sensitive     IMIPENEM 1 SENSITIVE Sensitive     PIP/TAZO <=4 SENSITIVE Sensitive     CEFEPIME 2 SENSITIVE Sensitive     * >=100,000 COLONIES/mL PSEUDOMONAS AERUGINOSA  Respiratory Panel by RT PCR (Flu A&B, Covid) - Nasopharyngeal Swab     Status: None   Collection Time: 05/21/19 10:03 AM   Specimen: Nasopharyngeal Swab  Result Value Ref  Range Status   SARS Coronavirus 2 by RT PCR NEGATIVE NEGATIVE Final    Comment: (NOTE) SARS-CoV-2 target nucleic acids are NOT DETECTED. The SARS-CoV-2 RNA is generally detectable in upper respiratoy specimens during the acute phase of infection. The lowest concentration of SARS-CoV-2 viral copies this assay can detect is 131 copies/mL. A negative result does not preclude SARS-Cov-2 infection and should not be used as the sole basis for treatment or other patient management decisions. A negative result  may occur with  improper specimen collection/handling, submission of specimen other than nasopharyngeal swab, presence of viral mutation(s) within the areas targeted by this assay, and inadequate number of viral copies (<131 copies/mL). A negative result must be combined with clinical observations, patient history, and epidemiological information. The expected result is Negative. Fact Sheet for Patients:  PinkCheek.be Fact Sheet for Healthcare Providers:  GravelBags.it This test is not yet ap proved or cleared by the [place] FDA and  has been authorized for detection and/or diagnosis of SARS-CoV-2 by FDA under an Emergency Use Authorization (EUA). This EUA will remain  in effect (meaning this test can be used) for the duration of the COVID-19 declaration under Section 564(b)(1) of the Act, 21 U.S.C. section 360bbb-3(b)(1), unless the authorization is terminated or revoked sooner.    Influenza A by PCR NEGATIVE NEGATIVE Final   Influenza B by PCR NEGATIVE NEGATIVE Final    Comment: (NOTE) The Xpert Xpress SARS-CoV-2/FLU/RSV assay is intended as an aid in  the diagnosis of influenza from Nasopharyngeal swab specimens and  should not be used as a sole basis for treatment. Nasal washings and  aspirates are unacceptable for Xpert Xpress SARS-CoV-2/FLU/RSV  testing. Fact Sheet for Patients:  PinkCheek.be Fact Sheet for Healthcare Providers: GravelBags.it This test is not yet approved or cleared by the [place] FDA and  has been authorized for detection and/or diagnosis of SARS-CoV-2 by  FDA under an Emergency Use Authorization (EUA). This EUA will remain  in effect (meaning this test can be used) for the duration of the  Covid-19 declaration under Section 564(b)(1) of the Act, 21  U.S.C. section 360bbb-3(b)(1), unless the authorization is  terminated or revoked. Performed at [name], [place] [address] [place], [place] [address]   Blood culture (routine x 2)     Status: Abnormal (Preliminary result)   Collection Time: 05/21/19 10:38 AM   Specimen: BLOOD RIGHT ARM  Result Value Ref Range Status   Specimen Description BLOOD RIGHT ARM  Final   Special Requests   Final    AEROBIC BOTTLE ONLY Blood Culture results may not be optimal due to an inadequate volume of blood received in culture bottles   Culture  Setup Time   Final    GRAM POSITIVE COCCI IN CLUSTERS AEROBIC BOTTLE ONLY CRITICAL RESULT CALLED TO, READ BACK BY AND VERIFIED WITH: S. COBLE RN, AT [phone] 05/22/19 BY D. VANHOOK    Culture (A)  Final    STAPHYLOCOCCUS AUREUS SUSCEPTIBILITIES TO FOLLOW Performed at [name], [place] [address], [place], [place] [address]    Report Status PENDING  Incomplete  Blood culture (routine x 2)     Status: Abnormal (Preliminary result)   Collection Time: 05/21/19 10:38 AM   Specimen: BLOOD RIGHT HAND  Result Value Ref Range Status   Specimen Description BLOOD RIGHT HAND  Final   Special Requests   Final    AEROBIC BOTTLE ONLY Blood Culture results may not be optimal due to an inadequate volume of blood received in culture bottles   Culture  Setup Time   Final    AEROBIC BOTTLE ONLY GRAM POSITIVE COCCI IN CLUSTERS CRITICAL RESULT CALLED TO, READ BACK BY AND VERIFIED WITH: Casandra Doffing RN 05/22/19 [id number] JDW  Performed at [name], [place] [address] [place], [place] [address]    Culture STAPHYLOCOCCUS AUREUS (A)  Final   Report Status PENDING  Incomplete  Blood Culture ID Panel (Reflexed)  Status: Abnormal   Collection Time: 05/21/19 10:38 AM  Result Value Ref Range Status   Enterococcus species NOT DETECTED NOT DETECTED Final   Listeria monocytogenes NOT DETECTED NOT DETECTED Final   Staphylococcus species DETECTED (A) NOT DETECTED Final    Comment: CRITICAL RESULT CALLED TO, READ BACK BY AND VERIFIED WITH: S. COBLE RN, AT [phone] 05/22/19 BY D. VANHOOK    Staphylococcus aureus (BCID) DETECTED (A) NOT DETECTED Final    Comment: Methicillin (oxacillin)-resistant Staphylococcus aureus (MRSA). MRSA is predictably resistant to beta-lactam antibiotics (except ceftaroline). Preferred therapy is vancomycin unless clinically contraindicated. Patient requires contact precautions if  hospitalized. CRITICAL RESULT CALLED TO, READ BACK BY AND VERIFIED WITH: S. COBLE RN, AT [phone] 05/22/19 BY D. VANHOOK    Methicillin resistance DETECTED (A) NOT DETECTED Final    Comment: CRITICAL RESULT CALLED TO, READ BACK BY AND VERIFIED WITH: S. COBLE RN, AT [phone] 05/22/19 BY D. VANHOOK    Streptococcus species NOT DETECTED NOT DETECTED Final   Streptococcus agalactiae NOT DETECTED NOT DETECTED Final   Streptococcus pneumoniae NOT DETECTED NOT DETECTED Final   Streptococcus pyogenes NOT DETECTED NOT DETECTED Final   Acinetobacter baumannii NOT DETECTED NOT DETECTED Final   Enterobacteriaceae species NOT DETECTED NOT DETECTED Final   Enterobacter cloacae complex NOT DETECTED NOT DETECTED Final   Escherichia coli NOT DETECTED NOT DETECTED Final   Klebsiella oxytoca NOT DETECTED NOT DETECTED Final   Klebsiella pneumoniae NOT DETECTED NOT DETECTED Final   Proteus species NOT DETECTED NOT DETECTED Final   Serratia marcescens NOT DETECTED NOT DETECTED Final   Haemophilus influenzae NOT DETECTED NOT DETECTED Final    Neisseria meningitidis NOT DETECTED NOT DETECTED Final   Pseudomonas aeruginosa NOT DETECTED NOT DETECTED Final   Candida albicans NOT DETECTED NOT DETECTED Final   Candida glabrata NOT DETECTED NOT DETECTED Final   Candida krusei NOT DETECTED NOT DETECTED Final   Candida parapsilosis NOT DETECTED NOT DETECTED Final   Candida tropicalis NOT DETECTED NOT DETECTED Final    Comment: Performed at [name], [place], [place], [place] [address]  SARS CORONAVIRUS 2 (TAT 6-24 HRS) Nasopharyngeal Nasopharyngeal Swab     Status: None   Collection Time: 05/22/19  2:45 PM   Specimen: Nasopharyngeal Swab  Result Value Ref Range Status   SARS Coronavirus 2 NEGATIVE NEGATIVE Final    Comment: (NOTE) SARS-CoV-2 target nucleic acids are NOT DETECTED. The SARS-CoV-2 RNA is generally detectable in upper and lower respiratory specimens during the acute phase of infection. Negative results do not preclude SARS-CoV-2 infection, do not rule out co-infections with other pathogens, and should not be used as the sole basis for treatment or other patient management decisions. Negative results must be combined with clinical observations, patient history, and epidemiological information. The expected result is Negative. Fact Sheet for Patients: SugarRoll.be Fact Sheet for Healthcare Providers: https://www.woods-mathews.com/ This test is not yet approved or cleared by the [place] FDA and  has been authorized for detection and/or diagnosis of SARS-CoV-2 by FDA under an Emergency Use Authorization (EUA). This EUA will remain  in effect (meaning this test can be used) for the duration of the COVID-19 declaration under Section 56 4(b)(1) of the Act, 21 U.S.C. section 360bbb-3(b)(1), unless the authorization is terminated or revoked sooner. Performed at [name], [place] [address], [place], [place] [address]      Terri Piedra, [facility] [place] [phone] Pager  05/23/2019  2:53 PM

## 2019-05-23 NOTE — Progress Notes (Signed)
I responded to consult. Nurse stated Pt was in a procedure and will take a couple of hours before her return. I will follow up at a later time.   Resident Chaplain  Fidel Levy [phone]

## 2019-05-23 NOTE — Progress Notes (Signed)
PROGRESS NOTE    Gina [name]  QPR:[id number] DOB: 12-27-1960 DOA: 05/22/2019 PCP: [name], MD   Brief Narrative: 59 year old female with DM, HTN, schizophrenia, bilateral BKA sent from [facility] due to positive blood culture.  Recent admission November 2020 for septic shock with acute peritonitis status post exlap with [name] patch on 11/23 subsequent blood culture grew Candida glabrata peritoneal fluid grew Candida albicans frequent adrenal crystalloid current protein per collection showed Candida glabrata Enterococcus faecalis stenotrophomonas patient was discharged to select LTAC on Unasyn Bactrim and antifungal for total 6 weeks.  Patient was discharged from [place] to [facility] whiere she has been living for last 3 months. Patient was seen in the ER for vague sleepiness malaise anorexia low-grade fever and leukocytosis at SNF, and in the ER exam labs unremarkable and patient was sent back to SNF but overnight blood culture returned MRSA 2/2 and she was readmitted to the hospital.  Subjective:  This morning alert awake oriented, feels somewhat better, no significant pain.  Getting dressing change on her stumps.Afebrile overnight.  Leukocytosis improved to 9.7K.  Last lactic acid 1.2.   Assessment & Plan:  MRSA bacteremia: indwelling PICC line used for TPN likely possible source.  Appreciate ID on board.  Plan is to continue vancomycin repeat blood cultures 3/12, TTE is being done to r/o endocarditis, PICC line to be able/holiday when able, drain management per general surgery, b/l stumps with wound and wound care is evaluating. Continue MRSA bacteremia precaution.  Intra-abdominal fluid collection with recent ex lap/partial small bowel resection and intra-abdominal abscess in November.  Imaging reviewed low suspicion for infection, holding TPN.  General surgery on consult regarding enteral nutrition, follow-up upper GI and further general surgery recommendation.  HTN: BP well  controlled , cont amlodipine.  Diabetes mellitus: hbA1c 5.9 3/12.  Blood sugar is stable on Lantus 10 unit nightly and sliding scale.  Severe protein-calorie malnutrition: TPN dependent, dietitian on consult.  Augment nutrition as tolerated, follow-up surgery recommendation for enteral.  Anemia of chronic disease: Monitor hemoglobin  Below-knee amputee/Atherosclerosis of native arteries: Stump evaluation by wound car Bilateral stumps are being evaluated by wound care.  Transaminitis: Suspect from TPN.  Monitor closely.  Asymptomatic bacteriuria: replace Foley  GERD: on PPI  Mood disorder:on Celexa  Goals of care: Palliative care consulted patient is DNR MOST form filled.  Per recommendation to treat what is treatable and plan on palliative care referral upon discharge to SNF.  Given significant comorbidities malnutrition high risk for readmission and decompensation.    Body mass index is 22.02 kg/m.  Pressure Ulcer: Pressure Injury 05/22/19 Coccyx Bilateral Stage 2 -  Partial thickness loss of dermis presenting as a shallow open injury with a red, pink wound bed without slough. (Active)  05/22/19 2104  Location: Coccyx  Location Orientation: Bilateral  Staging: Stage 2 -  Partial thickness loss of dermis presenting as a shallow open injury with a red, pink wound bed without slough.  Wound Description (Comments):   Present on Admission: Yes     Pressure Injury 05/22/19 Knee Right;Distal Unstageable - Full thickness tissue loss in which the base of the injury is covered by slough (yellow, tan, gray, green or brown) and/or eschar (tan, brown or black) in the wound bed. Stump (Active)  05/22/19 2106  Location: Knee  Location Orientation: Right;Distal  Staging: Unstageable - Full thickness tissue loss in which the base of the injury is covered by slough (yellow, tan, gray, green or brown) and/or eschar (tan,  brown or black) in the wound bed.  Wound Description (Comments): Stump    Present on Admission: Yes     Pressure Injury 05/22/19 Knee Left;Distal Unstageable - Full thickness tissue loss in which the base of the injury is covered by slough (yellow, tan, gray, green or brown) and/or eschar (tan, brown or black) in the wound bed. Stump (Active)  05/22/19 2108  Location: Knee  Location Orientation: Left;Distal  Staging: Unstageable - Full thickness tissue loss in which the base of the injury is covered by slough (yellow, tan, gray, green or brown) and/or eschar (tan, brown or black) in the wound bed.  Wound Description (Comments): Stump  Present on Admission: Yes     Pressure Injury 05/22/19 Thigh Left;Mid Stage 2 -  Partial thickness loss of dermis presenting as a shallow open injury with a red, pink wound bed without slough. (Active)  05/22/19 2109  Location: Thigh  Location Orientation: Left;Mid  Staging: Stage 2 -  Partial thickness loss of dermis presenting as a shallow open injury with a red, pink wound bed without slough.  Wound Description (Comments):   Present on Admission: Yes   DVT prophylaxis:lovenox Code Status: DNR Family Communication: plan of care discussed with patient at bedside. Disposition Plan: Patient is from: SNF Anticipated Disposition: to SNF Barriers to discharge or conditions that needs to be met prior to discharge: Patient remains hospitalized for MRSA bacteremia treatment and further work-up and source control and enteral nutrition assessment,  Consultants: Infectious disease, general surgery Procedures:see note Microbiology: Blood culture with MRSA 05/21/19  Medications: Scheduled Meds: . amLODipine  5 mg Oral Daily  . Chlorhexidine Gluconate Cloth  6 each Topical Daily  . enoxaparin (LOVENOX) injection  40 mg Subcutaneous Q24H  . escitalopram  10 mg Oral Daily  . gabapentin  300 mg Oral BID  . insulin aspart  0-9 Units Subcutaneous Q4H  . insulin glargine  10 Units Subcutaneous QHS  . Melatonin  3 mg Oral QHS  .  metoprolol tartrate  50 mg Oral BID  . pantoprazole  40 mg Oral Daily  . simethicone  80 mg Oral QID   Continuous Infusions: . vancomycin Stopped (05/22/19 1815)    Antimicrobials: Anti-infectives (From admission, onward)   Start     Dose/Rate Route Frequency Ordered Stop   05/22/19 1500  vancomycin (VANCOREADY) IVPB 1250 mg/250 mL     1,250 mg 166.7 mL/hr over 90 Minutes Intravenous Every 24 hours 05/22/19 1429         Objective: Vitals: Today's Vitals   05/22/19 2200 05/22/19 2338 05/23/19 0422 05/23/19 0520  BP:    133/61  Pulse:    72  Resp:    16  Temp:    98.1 F (36.7 C)  TempSrc:    Oral  SpO2:    99%  Weight:      Height:   5' 2"  (1.575 m)   PainSc: 5  Asleep      Intake/Output Summary (Last 24 hours) at 05/23/2019 0748 Last data filed at 05/23/2019 0600 Gross per 24 hour  Intake 436.25 ml  Output 650 ml  Net -213.75 ml   Filed Weights   05/22/19 2100  Weight: 54.6 kg   Weight change:    Intake/Output from previous day: 03/11 0701 - 03/12 0700 In: 436.3 [I.V.:436.3] Out: 650 [Urine:300; Drains:50; Stool:300] Intake/Output this shift: No intake/output data recorded.  Examination:  General exam: AAO, ill frail cachectic,weak appearing. HEENT:Oral mucosa moist, Ear/Nose WNL grossly,dentition normal. Respiratory  system: bilaterally clear,no use of accessory muscle, non tender. Cardiovascular system: S1 & S2 +, regular, No JVD. 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  Nervous System:Alert, awake, moving extremities and grossly nonfocal Extremities: No edema, distal peripheral pulses palpable.  Skin: No rashes,no icterus. MSK: BKA bilaterally with a stump with wound.    Data Reviewed: I have personally reviewed following labs and imaging studies CBC: Recent Labs  Lab 05/21/19 1038 05/22/19 1600 05/23/19 0534  WBC  12.5* 11.9* 9.7  NEUTROABS 9.5*  --   --   HGB 8.2* 7.6* 7.1*  HCT 26.0* 24.1* 22.1*  MCV 97.4 94.5 94.0  PLT 172 186 833   Basic Metabolic Panel: Recent Labs  Lab 05/21/19 1038 05/22/19 1600 05/23/19 0534  NA 134* 135 137  K 4.0 4.3 3.9  CL 104 101 102  CO2 22 25 23   GLUCOSE 158* 243* 109*  BUN 37* 46* 45*  CREATININE 0.77 0.88 1.11*  CALCIUM 8.7* 8.6* 8.7*   GFR: Estimated Creatinine Clearance: 43.7 mL/min (A) (by C-G formula based on SCr of 1.11 mg/dL (H)). Liver Function Tests: Recent Labs  Lab 05/21/19 1038 05/22/19 1600 05/23/19 0534  AST 55* 42* 36  ALT 46* 56* 46*  ALKPHOS 179* 181* 169*  BILITOT 0.7 1.3* 1.5*  PROT 6.7 7.0 6.8  ALBUMIN 1.6* 1.7* 1.6*   No results for input(s): LIPASE, AMYLASE in the last 168 hours. No results for input(s): AMMONIA in the last 168 hours. Coagulation Profile: No results for input(s): INR, PROTIME in the last 168 hours. Cardiac Enzymes: No results for input(s): CKTOTAL, CKMB, CKMBINDEX, TROPONINI in the last 168 hours. BNP (last 3 results) No results for input(s): PROBNP in the last 8760 hours. HbA1C: Recent Labs    05/23/19 0534  HGBA1C 5.9*   CBG: Recent Labs  Lab 05/22/19 1708 05/22/19 2123 05/23/19 0733  GLUCAP 220* 85 92   Lipid Profile: No results for input(s): CHOL, HDL, LDLCALC, TRIG, CHOLHDL, LDLDIRECT in the last 72 hours. Thyroid Function Tests: No results for input(s): TSH, T4TOTAL, FREET4, T3FREE, THYROIDAB in the last 72 hours. Anemia Panel: Recent Labs    05/21/19 1038  VITAMINB12 679  FOLATE 23.5  FERRITIN 574*  TIBC 143*  IRON 5*  RETICCTPCT 1.7   Sepsis Labs: Recent Labs  Lab 05/21/19 1038 05/22/19 1633  LATICACIDVEN 1.5 1.2    Recent Results (from the past 240 hour(s))  Urine Culture     Status: Abnormal (Preliminary result)   Collection Time: 05/21/19 10:00 AM   Specimen: Urine, Catheterized  Result Value Ref Range Status   Specimen Description URINE, CATHETERIZED  Final     Special Requests NONE  Final   Culture (A)  Final    >=100,000 COLONIES/mL PSEUDOMONAS AERUGINOSA CULTURE REINCUBATED FOR BETTER GROWTH Performed at [facility], [place] [address] [place], [place] [address]    Report Status PENDING  Incomplete   Organism ID, Bacteria PSEUDOMONAS AERUGINOSA (A)  Final      Susceptibility   Pseudomonas aeruginosa - MIC*    CEFTAZIDIME 4 SENSITIVE Sensitive     CIPROFLOXACIN <=0.25 SENSITIVE Sensitive     GENTAMICIN <=1 SENSITIVE Sensitive     IMIPENEM 1 SENSITIVE Sensitive     PIP/TAZO <=4 SENSITIVE Sensitive     CEFEPIME 2 SENSITIVE Sensitive     * >=100,000 COLONIES/mL PSEUDOMONAS AERUGINOSA  Respiratory Panel by RT PCR (Flu A&B,  Covid) - Nasopharyngeal Swab     Status: None   Collection Time: 05/21/19 10:03 AM   Specimen: Nasopharyngeal Swab  Result Value Ref Range Status   SARS Coronavirus 2 by RT PCR NEGATIVE NEGATIVE Final    Comment: (NOTE) SARS-CoV-2 target nucleic acids are NOT DETECTED. The SARS-CoV-2 RNA is generally detectable in upper respiratoy specimens during the acute phase of infection. The lowest concentration of SARS-CoV-2 viral copies this assay can detect is 131 copies/mL. A negative result does not preclude SARS-Cov-2 infection and should not be used as the sole basis for treatment or other patient management decisions. A negative result may occur with  improper specimen collection/handling, submission of specimen other than nasopharyngeal swab, presence of viral mutation(s) within the areas targeted by this assay, and inadequate number of viral copies (<131 copies/mL). A negative result must be combined with clinical observations, patient history, and epidemiological information. The expected result is Negative. Fact Sheet for Patients:  PinkCheek.be Fact Sheet for Healthcare Providers:  GravelBags.it This test is not yet ap proved or cleared by the [place] FDA and  has been authorized for detection and/or diagnosis of SARS-CoV-2 by FDA under an Emergency Use Authorization (EUA). This EUA will remain  in effect (meaning this test can be used) for the duration of the COVID-19 declaration under Section 564(b)(1) of the Act, 21 U.S.C. section 360bbb-3(b)(1), unless the authorization is terminated or revoked sooner.    Influenza A by PCR NEGATIVE NEGATIVE Final   Influenza B by PCR NEGATIVE NEGATIVE Final    Comment: (NOTE) The Xpert Xpress SARS-CoV-2/FLU/RSV assay is intended as an aid in  the diagnosis of influenza from Nasopharyngeal swab specimens and  should not be used as a sole basis for treatment. Nasal washings and  aspirates are unacceptable for Xpert Xpress SARS-CoV-2/FLU/RSV  testing. Fact Sheet for Patients: PinkCheek.be Fact Sheet for Healthcare Providers: GravelBags.it This test is not yet approved or cleared by the [place] FDA and  has been authorized for detection and/or diagnosis of SARS-CoV-2 by  FDA under an Emergency Use Authorization (EUA). This EUA will remain  in effect (meaning this test can be used) for the duration of the  Covid-19 declaration under Section 564(b)(1) of the Act, 21  U.S.C. section 360bbb-3(b)(1), unless the authorization is  terminated or revoked. Performed at [facility], [place] [address] [place], [place] [address]   Blood culture (routine x 2)     Status: Abnormal (Preliminary result)   Collection Time: 05/21/19 10:38 AM   Specimen: BLOOD RIGHT ARM  Result Value Ref Range Status   Specimen Description BLOOD RIGHT ARM  Final   Special Requests   Final    AEROBIC BOTTLE ONLY Blood Culture results may not be optimal due to an inadequate volume of blood received in culture bottles   Culture  Setup Time   Final    GRAM POSITIVE COCCI IN CLUSTERS AEROBIC BOTTLE ONLY CRITICAL RESULT CALLED TO, READ BACK BY AND VERIFIED  WITH: S. COBLE RN, AT [phone] 05/22/19 BY D. VANHOOK    Culture (A)  Final    STAPHYLOCOCCUS AUREUS SUSCEPTIBILITIES TO FOLLOW Performed at [facility], [place] [address] [place], [place] [address]    Report Status PENDING  Incomplete  Blood culture (routine x 2)     Status: Abnormal (Preliminary result)   Collection Time: 05/21/19 10:38 AM   Specimen: BLOOD RIGHT HAND  Result Value Ref Range Status   Specimen Description BLOOD RIGHT  HAND  Final   Special Requests   Final    AEROBIC BOTTLE ONLY Blood Culture results may not be optimal due to an inadequate volume of blood received in culture bottles   Culture  Setup Time   Final    AEROBIC BOTTLE ONLY GRAM POSITIVE COCCI IN CLUSTERS CRITICAL RESULT CALLED TO, READ BACK BY AND VERIFIED WITH: Casandra Doffing RN 05/22/19 6948 JDW Performed at [facility], [address] [place], [place] [address]    Culture STAPHYLOCOCCUS AUREUS (A)  Final   Report Status PENDING  Incomplete  Blood Culture ID Panel (Reflexed)     Status: Abnormal   Collection Time: 05/21/19 10:38 AM  Result Value Ref Range Status   Enterococcus species NOT DETECTED NOT DETECTED Final   Listeria monocytogenes NOT DETECTED NOT DETECTED Final   Staphylococcus species DETECTED (A) NOT DETECTED Final    Comment: CRITICAL RESULT CALLED TO, READ BACK BY AND VERIFIED WITH: S. COBLE RN, AT [phone] 05/22/19 BY D. VANHOOK    Staphylococcus aureus (BCID) DETECTED (A) NOT DETECTED Final    Comment: Methicillin (oxacillin)-resistant Staphylococcus aureus (MRSA). MRSA is predictably resistant to beta-lactam antibiotics (except ceftaroline). Preferred therapy is vancomycin unless clinically contraindicated. Patient requires contact precautions if  hospitalized. CRITICAL RESULT CALLED TO, READ BACK BY AND VERIFIED WITH: S. COBLE RN, AT [phone] 05/22/19 BY D. VANHOOK    Methicillin resistance DETECTED (A) NOT DETECTED Final    Comment: CRITICAL RESULT CALLED TO, READ BACK BY AND VERIFIED  WITH: S. COBLE RN, AT [phone] 05/22/19 BY D. VANHOOK    Streptococcus species NOT DETECTED NOT DETECTED Final   Streptococcus agalactiae NOT DETECTED NOT DETECTED Final   Streptococcus pneumoniae NOT DETECTED NOT DETECTED Final   Streptococcus pyogenes NOT DETECTED NOT DETECTED Final   Acinetobacter baumannii NOT DETECTED NOT DETECTED Final   Enterobacteriaceae species NOT DETECTED NOT DETECTED Final   Enterobacter cloacae complex NOT DETECTED NOT DETECTED Final   Escherichia coli NOT DETECTED NOT DETECTED Final   Klebsiella oxytoca NOT DETECTED NOT DETECTED Final   Klebsiella pneumoniae NOT DETECTED NOT DETECTED Final   Proteus species NOT DETECTED NOT DETECTED Final   Serratia marcescens NOT DETECTED NOT DETECTED Final   Haemophilus influenzae NOT DETECTED NOT DETECTED Final   Neisseria meningitidis NOT DETECTED NOT DETECTED Final   Pseudomonas aeruginosa NOT DETECTED NOT DETECTED Final   Candida albicans NOT DETECTED NOT DETECTED Final   Candida glabrata NOT DETECTED NOT DETECTED Final   Candida krusei NOT DETECTED NOT DETECTED Final   Candida parapsilosis NOT DETECTED NOT DETECTED Final   Candida tropicalis NOT DETECTED NOT DETECTED Final    Comment: Performed at [facility], [place]. [address], [place], [place] [address]  SARS CORONAVIRUS 2 (TAT 6-24 HRS) Nasopharyngeal Nasopharyngeal Swab     Status: None   Collection Time: 05/22/19  2:45 PM   Specimen: Nasopharyngeal Swab  Result Value Ref Range Status   SARS Coronavirus 2 NEGATIVE NEGATIVE Final    Comment: (NOTE) SARS-CoV-2 target nucleic acids are NOT DETECTED. The SARS-CoV-2 RNA is generally detectable in upper and lower respiratory specimens during the acute phase of infection. Negative results do not preclude SARS-CoV-2 infection, do not rule out co-infections with other pathogens, and should not be used as the sole basis for treatment or other patient management decisions. Negative results must be combined with  clinical observations, patient history, and epidemiological information. The expected result is Negative. Fact Sheet for Patients: SugarRoll.be Fact Sheet for  Healthcare Providers: https://www.woods-mathews.com/ This test is not yet approved or cleared by the [place] and  has been authorized for detection and/or diagnosis of SARS-CoV-2 by FDA under an Emergency Use Authorization (EUA). This EUA will remain  in effect (meaning this test can be used) for the duration of the COVID-19 declaration under Section 56 4(b)(1) of the Act, 21 U.S.C. section 360bbb-3(b)(1), unless the authorization is terminated or revoked sooner. Performed at [facility], [place] [address] [place], [place] [address]       Radiology Studies: CT ABDOMEN PELVIS W CONTRAST  Result Date: 05/21/2019 CLINICAL DATA:  Acute generalized abdominal pain. EXAM: CT ABDOMEN AND PELVIS WITH CONTRAST TECHNIQUE: Multidetector CT imaging of the abdomen and pelvis was performed using the standard protocol following bolus administration of intravenous contrast. CONTRAST:  162m OMNIPAQUE IOHEXOL 300 MG/ML  SOLN COMPARISON:  April 11, 2019. FINDINGS: Lower chest: Mild right pleural effusion is noted with associated atelectasis of the right lower lobe. Mild left basilar subsegmental atelectasis is noted. Hepatobiliary: Solitary gallstone is noted. No biliary dilatation is noted. The liver is unremarkable. Pancreas: Unremarkable. No pancreatic ductal dilatation or surrounding inflammatory changes. Spleen: Normal in size without focal abnormality. Adrenals/Urinary Tract: Adrenal glands and kidneys appear normal. No hydronephrosis or renal obstruction is noted. Urinary bladder is decompressed secondary to Foley catheter. Stomach/Bowel: The stomach is unremarkable. There is no evidence of bowel obstruction or inflammation. The appendix is unremarkable. Vascular/Lymphatic: No significant  vascular findings are present. No enlarged abdominal or pelvic lymph nodes. Reproductive: Uterus and bilateral adnexa are unremarkable. Other: Surgical drain is seen passing underneath the liver with distal tip inferior to the left hepatic lobe. Continued presence of pigtail catheter within complex fluid collection anteriorly in the abdomen concerning for abscess. It is significantly smaller compared to prior exam, currently measuring 7.9 x 1.5 cm. Musculoskeletal: No acute or significant osseous findings. IMPRESSION: 1. Mild right pleural effusion is noted with associated atelectasis of the right lower lobe. 2. Solitary gallstone is noted. 3. Surgical drain is seen passing underneath the liver with distal tip inferior to the left hepatic lobe. Continued presence of complex fluid collection anteriorly in the abdomen concerning for abscess. It is significantly smaller compared to prior exam, currently measuring 7.9 x 1.5 cm. Pigtail drainage catheter is unchanged in position. Electronically Signed   By: JMarijo ConceptionM.D.   On: 05/21/2019 13:36   DG Chest Port 1 View  Result Date: 05/21/2019 CLINICAL DATA:  Weakness, altered mental status EXAM: PORTABLE CHEST 1 VIEW COMPARISON:  03/26/2019 FINDINGS: Left PICC line in place with the tip in the SVC. Low lung volumes. Bibasilar airspace opacities, right greater than left. Small right pleural effusion. Heart is mildly enlarged. No acute bony abnormality. IMPRESSION: Bibasilar atelectasis or infiltrates, right greater than left. Small right pleural effusion. Electronically Signed   By: KRolm BaptiseM.D.   On: 05/21/2019 10:45     LOS: 1 day   Time spent: More than 50% of that time was spent in counseling and/or coordination of care.  RAntonieta Pert MD [facility]  05/23/2019, 7:48 AM

## 2019-05-23 NOTE — Evaluation (Signed)
Physical Therapy Evaluation Patient Details Name: Gina [name] MRN: [id number] DOB: 08-05-1960 Today's Date: 05/23/2019   History of Present Illness  Pt is a 59 y/o female admitted from SNF secondary to abnormal lab values. Found to have MRSA bacteremia. Pt also with abdominal fistula and multiple abdominal drains secondary to previous complicated hospital admission. PMH includes HTN, DM, bilateral BKA, and schizophrenia.   Clinical Impression  Pt admitted secondary to problem above with deficits below. Pt requiring mod A to roll from side to side. Performed supine LE HEP. Per pt plan is to return to SNF, however, per updated CSW notes, family may decide to take pt home. Pt reports using hoyer lift for transfers at [facility]. Feel pt would benefit from continued SNF level therapies prior to d/c home. However, if pt does return home will require DME below and 24/7 assist from family. Feel pt is likely close to baseline, so no further acute PT needs. Will sign off and defer to next venue of care. If needs change, please reconsult.     Follow Up Recommendations SNF;Supervision/Assistance - 24 hour(max HH services if family decides to take pt home )    Equipment Recommendations  Wheelchair (measurements PT);Wheelchair cushion (measurements PT);Hospital bed(hoyer lift with hoyer lift pad if family decides to d/c home)    Recommendations for Other Services       Precautions / Restrictions Precautions Precautions: Fall;Other (comment) Precaution Comments: bilateral BKA  Restrictions Weight Bearing Restrictions: No      Mobility  Bed Mobility Overal bed mobility: Needs Assistance Bed Mobility: Rolling Rolling: Mod assist         General bed mobility comments: Mod A to roll from side to side to simulate rolling for hoyer pad placement. Required mod A for assist with rolling.   Transfers                 General transfer comment: Dependent at baseline   Ambulation/Gait                Stairs            Wheelchair Mobility    Modified Rankin (Stroke Patients Only)       Balance                                             Pertinent Vitals/Pain Pain Assessment: Faces Faces Pain Scale: Hurts even more Pain Location: generalized Pain Descriptors / Indicators: Grimacing;Guarding Pain Intervention(s): Limited activity within patient's tolerance;Monitored during session;Repositioned    Home Living Family/patient expects to be discharged to:: Skilled nursing facility                      Prior Function Level of Independence: Needs assistance   Gait / Transfers Assistance Needed: Requires hoyer lift for transfers.   ADL's / Homemaking Assistance Needed: Requires assist with bathing/dressing at SNF         Hand Dominance        Extremity/Trunk Assessment   Upper Extremity Assessment Upper Extremity Assessment: Defer to OT evaluation    Lower Extremity Assessment Lower Extremity Assessment: RLE deficits/detail;LLE deficits/detail RLE Deficits / Details: BKA at baseline  LLE Deficits / Details: BKA at baseline. Increased weakness noted.        Communication   Communication: No difficulties  Cognition Arousal/Alertness: Awake/alert Behavior During Therapy: WFL for  tasks assessed/performed Overall Cognitive Status: No family/caregiver present to determine baseline cognitive functioning                                        General Comments      Exercises General Exercises - Lower Extremity Heel Slides: AROM;Both;Supine;5 reps(knee flexion ) Hip ABduction/ADduction: AROM;Both;5 reps Straight Leg Raises: AROM;Both;5 reps   Assessment/Plan    PT Assessment All further PT needs can be met in the next venue of care  PT Problem List Decreased strength;Decreased balance;Decreased mobility       PT Treatment Interventions      PT Goals (Current goals can be found in the Care Plan  section)  Acute Rehab PT Goals Patient Stated Goal: to feel better PT Goal Formulation: With patient Time For Goal Achievement: 05/23/19 Potential to Achieve Goals: Fair    Frequency     Barriers to discharge        Co-evaluation               AM-PAC PT "6 Clicks" Mobility  Outcome Measure Help needed turning from your back to your side while in a flat bed without using bedrails?: A Lot Help needed moving from lying on your back to sitting on the side of a flat bed without using bedrails?: A Lot Help needed moving to and from a bed to a chair (including a wheelchair)?: Total Help needed standing up from a chair using your arms (e.g., wheelchair or bedside chair)?: Total Help needed to walk in hospital room?: Total Help needed climbing 3-5 steps with a railing? : Total 6 Click Score: 8    End of Session   Activity Tolerance: Patient tolerated treatment well Patient left: in bed;with bed alarm set;with call bell/phone within reach Nurse Communication: Mobility status PT Visit Diagnosis: Difficulty in walking, not elsewhere classified (R26.2)    Time: 1215-1227 PT Time Calculation (min) (ACUTE ONLY): 12 min   Charges:   PT Evaluation $PT Eval Moderate Complexity: 1 Mod          Lou Miner, DPT  Acute Rehabilitation Services  Pager: [phone] Office: [phone]   Rudean Hitt 05/23/2019, 5:50 PM

## 2019-05-23 NOTE — Progress Notes (Signed)
Initial Nutrition Assessment  DOCUMENTATION CODES:   Not applicable  INTERVENTION:   Awaiting nutrition poc; TPN vs EN. Pharmacy Consult needed if plan to continue TPN  Recommend checking Vit C and zinc levels; supplement if deificient   NUTRITION DIAGNOSIS:   Inadequate oral intake related to inability to eat, altered GI function as evidenced by NPO status.  GOAL:   Patient will meet greater than or equal to 90% of their needs  MONITOR:   Labs, Weight trends, Skin, Other (Comment)(nutrition poc)  REASON FOR ASSESSMENT:   Consult Assessment of nutrition requirement/status  ASSESSMENT:   59 yo female presents with malaise from [facility] and admitted with MRSA bacteremia. Pt admitted 01/2019 with septic shock and found to have acute peritonitis and underwent ex lap with partial SB resection and intra-abdominal abscess. Pt with SB EC fistula on TPNPMH includes DM, HTN, bilateral BKA, schizophrenia.  RD working remotely.  NPO. Awaiting surgical consult with regards to ability to utilize Enteral Nutrition vs continued use of TPN  Noted pt has not taken anything by mouth since November. Pt has been on TPN, unsure of outpatient TPN prescription at this time  Pt with bilateral BKAs with wounds on both sides  Per weight encounters, weight has been relatively stable over the past year. Current weight 54.6 kg  Needs Nutrition-Focused Physical Exam on follow-up  EC fistula with rectal pouch in place; 300 mL stool output documented  Labs: Creatinine 1.11, BUN 45 Meds: ss novolog, lantus, simethicone    Diet Order:   Diet Order            Diet NPO time specified Except for: Sips with Meds  Diet effective now              EDUCATION NEEDS:   Not appropriate for education at this time  Skin:  Skin Assessment: Skin Integrity Issues: Skin Integrity Issues:: Stage II, Unstageable Stage II: L. leg x 2 Unstageable: b/l BKA stumps Other: Midline, lower abdominal  EC fistula  Last BM:  3/12 ostomy  Height:   Ht Readings from Last 1 Encounters:  05/23/19 5\' 2"  (1.575 m)    Weight:   Wt Readings from Last 1 Encounters:  05/22/19 54.6 kg    BMI:  Body mass index is 22.02 kg/m.  Estimated Nutritional Needs:   Kcal:  1800-2000 kcals  Protein:  83-110 g  Fluid:  >/= 1.9 L   Kerman Passey MS, RDN, LDN, CNSC RD Pager Number and Weekend/On-Call After Hours Pager Located in [place]

## 2019-05-23 NOTE — Evaluation (Addendum)
Clinical/Bedside Swallow Evaluation Patient Details  Name: Gina [name] MRN: [id number] Date of Birth: August 21, 1960  Today's Date: 05/23/2019 Time: SLP Start Time (ACUTE ONLY): 9924 SLP Stop Time (ACUTE ONLY): 1649 SLP Time Calculation (min) (ACUTE ONLY): 32 min  Past Medical History:  Past Medical History:  Diagnosis Date  . Diabetes mellitus   . Hypertension   . Osteomyelitis of right foot ([place]) 02/22/2017  . Perforated gastric ulcer ([place])   . S/P bilateral BKA (below knee amputation) ([place])   . Schizophrenia ([place])   . Schizophrenia ([place])   . Septic arthritis of interphalangeal joint of toe of left foot ([place]) 06/02/2016  . Stress incontinence 04/26/2017   Past Surgical History:  Past Surgical History:  Procedure Laterality Date  . AMPUTATION Left 06/05/2016   Procedure: LEFT FOOT TRANSMETATARSAL AMPUTATION;  Surgeon: [name], MD;  Location: [facility];  Service: Orthopedics;  Laterality: Left;  . AMPUTATION Right 11/11/2017   Procedure: AMPUTATION BELOW KNEE;  Surgeon: [name], MD;  Location: [place];  Service: Orthopedics;  Laterality: Right;  . AMPUTATION Left 05/10/2018   Procedure: LEFT BELOW KNEE AMPUTATION;  Surgeon: [name], MD;  Location: [place];  Service: Orthopedics;  Laterality: Left;  . AMPUTATION Left 12/28/2018   Procedure: AMPUTATION THIRD DIGIT;  Surgeon: Iran Planas, MD;  Location: [facility];  Service: Orthopedics;  Laterality: Left;  . AMPUTATION TOE Right 02/23/2017   Procedure: AMPUTATION RIGHT THIRD TOE;  Surgeon: [name], MD;  Location: [place];  Service: Orthopedics;  Laterality: Right;  . I & D EXTREMITY Right 11/09/2017   Procedure: Debride Ulcer Right Heel;  Surgeon: [name], MD;  Location: [place];  Service: Orthopedics;  Laterality: Right;  . I & D EXTREMITY Left 12/28/2018   Procedure: IRRIGATION AND DEBRIDEMENT EXTREMITY;  Surgeon: Iran Planas, MD;  Location: [facility];  Service: Orthopedics;  Laterality: Left;  . INCISION AND  DRAINAGE OF WOUND Left 12/26/2018   Procedure: IRRIGATION AND DEBRIDEMENT LEFT HAND;  Surgeon: Iran Planas, MD;  Location: [facility];  Service: Orthopedics;  Laterality: Left;  . LAPAROTOMY N/A 02/02/2019   Procedure: EXPLORATORY LAPAROTOMY WITH OVERSEW OF DUODENAL ULCER;  Surgeon: Alphonsa Overall, MD;  Location: [facility];  Service: General;  Laterality: N/A;  . TUBAL LIGATION     HPI:  Gina [name] is a 59 y.o. F with hx DM, HTN, bilateral BKA, schizophrenia, and recent perforated ulcer leading to septic shock, DKA, fungemia, and persistent peritoneal abscess who presents with malaise, found to have MRSA bacteremia. Patient admitted in Nov 2020 for septic shock, found to have acute peritonitis. [facility], and anidulafungin for total 6 weeks. Based on chart review, patient was seen for dysphagia and participated in [facility] 03/2019 while at [facility]. However, full notes not available. She was then discharged from [facility] to [facility], where she has been for last three months.  Pt now admitted with MRSA. CXR 3/10: Bibasilar atelectasis or infiltrates, right greater than left. Small right pleural effusion.   Assessment / Plan / Recommendation Clinical Impression   Patient seen at bedside for clinical swallowing examination. Patient's daughter present for portion of assessment. Per chart review, daughter and patient, she was last eating an oral diet in November. Pt with complex history, most recently receiving nutrition via TPN.  Patient alert, oral mechanism and cranial nerve exam unremarkable. Patient with missing dentition. Oral mucosa clear. Lingual surface with white coating, ?oral thrush.  ST able to elicit volitional swallow and volitional cough. Patient seen with thin liquids (water) via spoon, cup and straw sips. Oral phase adequate, no overt s/sx aspiration. Pt seen with puree solids (applesauce), soft solids (chopped peaches) and regular solids (one  bite of graham cracker). Patient with adequate oral acceptance, prolonged mastication with regular solids, adequate bolus formation and AP transport. Swallow initiation appeared timely. Min residue after the swallow with regular solids, no overt s/sx aspiration. Pt denies globus sensation with all consistencies.   Secure chat initiated with MD (Dr. Maren Beach) and RN re: results of this bedside assessment, but concern regarding her ability to tolerate a PO diet due to her complex history. MD indicated understanding. SLP will follow-up. A MBSS may be indicated to r/o silent aspiration and objectively assess swallow function. At this time, ST recommend patient remain NPO.   SLP Visit Diagnosis: Dysphagia, unspecified (R13.10)    Aspiration Risk       Diet Recommendation NPO except meds        Other  Recommendations Oral Care Recommendations: Oral care QID   Follow up Recommendations Other (comment)(TBD)      Frequency and Duration min 2x/week  2 weeks       Prognosis Prognosis for Safe Diet Advancement: Fair Barriers to Reach Goals: Severity of deficits;Time post onset      Swallow Study   General HPI: Gina [name] is a 59 y.o. F with hx DM, HTN, bilateral BKA, schizophrenia, and recent perforated ulcer leading to septic shock, DKA, fungemia, and persistent peritoneal abscess who presents with malaise, found to have MRSA bacteremia. Patient admitted in Nov 2020 for septic shock, found to have acute peritonitis. [facility] B[facility], and anidulafungin for total 6 weeks. Based on chart review, patient was seen for dysphagia and participated in [facility] 03/2019 while at [facility]. However, full notes not available. She was then discharged from [facility] to [facility], where she has been for last three months.  Pt now admitted with MRSA. CXR 3/10: Bibasilar atelectasis or infiltrates, right greater than left. Small right pleural effusion. Type of Study:  Bedside Swallow Evaluation Diet Prior to this Study: NPO Temperature Spikes Noted: No Respiratory Status: Room air History of Recent Intubation: No Behavior/Cognition: Alert;Cooperative;Pleasant mood Oral Cavity Assessment: Within Functional Limits Oral Care Completed by SLP: Yes Oral Cavity - Dentition: Missing dentition Vision: Functional for self-feeding Self-Feeding Abilities: Able to feed self;Needs set up Patient Positioning: Upright in bed Baseline Vocal Quality: Normal Volitional Cough: Strong Volitional Swallow: Able to elicit    Oral/Motor/Sensory Function Overall Oral Motor/Sensory Function: Within functional limits   Ice Chips Ice chips: Not tested   Thin Liquid Thin Liquid: Within functional limits    Nectar Thick Nectar Thick Liquid: Not tested   Honey Thick Honey Thick Liquid: Not tested   Puree Puree: Within functional limits   Solid     Solid: Within functional limits      Mercy Malena 05/23/2019,5:08 PM  Marina Goodell, M.Ed., [facility] Acute Rehabilitation [phone]: Acute Rehab office [phone] - pager

## 2019-05-23 NOTE — Progress Notes (Signed)
[facility] Progress Note     Subjective: CC-  Patient seen early this morning prior to UGI. Main complaint is that she is hungry. States that she swallows pills with sips of water without issues, does not note any difficult swallowing or issues with getting choked.   Objective: Vital signs in last 24 hours: Temp:  [98.1 F (36.7 C)-99.1 F (37.3 C)] 98.9 F (37.2 C) (03/12 0932) Pulse Rate:  [71-80] 80 (03/12 0932) Resp:  [16-18] 18 (03/12 0932) BP: (119-149)/(61-81) 128/61 (03/12 0932) SpO2:  [94 %-99 %] 94 % (03/12 0932) Weight:  [54.6 kg] 54.6 kg (03/11 2100) Last BM Date: 05/22/19  Intake/Output from previous day: 03/11 0701 - 03/12 0700 In: 436.3 [I.V.:436.3] Out: 650 [Urine:300; Drains:50; Stool:300] Intake/Output this shift: No intake/output data recorded.  PE: Gen:  Alert, NAD, pleasant HEENT: EOM's intact, pupils equal and round Card:  RRR Pulm:  CTAB, no W/R/R, rate and effort normal Abd: soft, nontender, nondistended, Eakins in place over fistula with GI contents in bag, JP drain and IR drain in place also with dark brown fluid Ext:  S/p BKA Skin: warm and dry  Lab Results:  Recent Labs    05/22/19 1600 05/23/19 0534  WBC 11.9* 9.7  HGB 7.6* 7.1*  HCT 24.1* 22.1*  PLT 186 171   BMET Recent Labs    05/22/19 1600 05/23/19 0534  NA 135 137  K 4.3 3.9  CL 101 102  CO2 25 23  GLUCOSE 243* 109*  BUN 46* 45*  CREATININE 0.88 1.11*  CALCIUM 8.6* 8.7*   PT/INR No results for input(s): LABPROT, INR in the last 72 hours. CMP     Component Value Date/Time   NA 137 05/23/2019 0534   NA 139 10/04/2017 1705   K 3.9 05/23/2019 0534   CL 102 05/23/2019 0534   CO2 23 05/23/2019 0534   GLUCOSE 109 (H) 05/23/2019 0534   BUN 45 (H) 05/23/2019 0534   BUN 11 10/04/2017 1705   CREATININE 1.11 (H) 05/23/2019 0534   CREATININE 0.68 04/28/2016 1640   CALCIUM 8.7 (L) 05/23/2019 0534   PROT 6.8 05/23/2019 0534   PROT 7.1 10/04/2017 1705    ALBUMIN 1.6 (L) 05/23/2019 0534   ALBUMIN 3.7 10/04/2017 1705   AST 36 05/23/2019 0534   ALT 46 (H) 05/23/2019 0534   ALKPHOS 169 (H) 05/23/2019 0534   BILITOT 1.5 (H) 05/23/2019 0534   BILITOT <0.2 10/04/2017 1705   GFRNONAA 55 (L) 05/23/2019 0534   GFRNONAA >89 04/28/2016 1640   GFRAA >60 05/23/2019 0534   GFRAA >89 04/28/2016 1640   Lipase     Component Value Date/Time   LIPASE 27 04/01/2016 1040       Studies/Results: ECHOCARDIOGRAM COMPLETE  Result Date: 05/23/2019    ECHOCARDIOGRAM REPORT   Patient Name:   LYNEL FORESTER Date of Exam: 05/23/2019 Medical Rec #:  [id number]       Height:       62.0 in Accession #:    [id number]      Weight:       120.4 lb Date of Birth:  [birth year]        BSA:          1.541 m Patient Age:    41 years        BP:           128/61 mmHg Patient Gender: F  HR:           76 bpm. Exam Location:  Inpatient Procedure: 2D Echo Indications:    bacteremia 790.7  History:        Patient has prior history of Echocardiogram examinations, most                 recent 03/04/2019. Signs/Symptoms:Bacteremia; Risk                 Factors:Diabetes and Hypertension.  Sonographer:    Johny Chess Referring Phys: [id number] CHRISTOPHER P DANFORD IMPRESSIONS  1. Left ventricular ejection fraction, by estimation, is 55 to 60%. The left ventricle has normal function. The left ventricle has no regional wall motion abnormalities. Left ventricular diastolic parameters were normal.  2. Right ventricular systolic function is normal. The right ventricular size is normal.  3. The mitral valve is normal in structure. No evidence of mitral valve regurgitation. No evidence of mitral stenosis.  4. The aortic valve is normal in structure. Aortic valve regurgitation is not visualized. No aortic stenosis is present. FINDINGS  Left Ventricle: Left ventricular ejection fraction, by estimation, is 55 to 60%. The left ventricle has normal function. The left ventricle has no regional  wall motion abnormalities. The left ventricular internal cavity size was normal in size. There is  no left ventricular hypertrophy. Left ventricular diastolic parameters were normal. Right Ventricle: The right ventricular size is normal. No increase in right ventricular wall thickness. Right ventricular systolic function is normal. Left Atrium: Left atrial size was normal in size. Right Atrium: Right atrial size was normal in size. Pericardium: There is no evidence of pericardial effusion. Mitral Valve: The mitral valve is normal in structure. No evidence of mitral valve regurgitation. No evidence of mitral valve stenosis. Tricuspid Valve: The tricuspid valve is normal in structure. Tricuspid valve regurgitation is trivial. Aortic Valve: The aortic valve is normal in structure. Aortic valve regurgitation is not visualized. No aortic stenosis is present. Pulmonic Valve: The pulmonic valve was normal in structure. Pulmonic valve regurgitation is not visualized. Aorta: The aortic root and ascending aorta are structurally normal, with no evidence of dilitation. IAS/Shunts: The atrial septum is grossly normal.  LEFT VENTRICLE PLAX 2D LVIDd:         4.30 cm  Diastology LVIDs:         3.10 cm  LV e' lateral:   8.81 cm/s LV PW:         1.00 cm  LV E/e' lateral: 12.3 LV IVS:        0.80 cm  LV e' medial:    6.20 cm/s LVOT diam:     1.70 cm  LV E/e' medial:  17.4 LV SV:         57 LV SV Index:   37 LVOT Area:     2.27 cm  RIGHT VENTRICLE RV S prime:     12.00 cm/s TAPSE (M-mode): 1.8 cm LEFT ATRIUM             Index       RIGHT ATRIUM           Index LA diam:        2.80 cm 1.82 cm/m  RA Area:     10.80 cm LA Vol (A2C):   34.3 ml 22.26 ml/m RA Volume:   21.90 ml  14.21 ml/m LA Vol (A4C):   34.5 ml 22.39 ml/m LA Biplane Vol: 36.3 ml 23.56 ml/m  AORTIC VALVE LVOT Vmax:  139.00 cm/s LVOT Vmean:  90.400 cm/s LVOT VTI:    0.251 m  AORTA Ao Root diam: 2.60 cm MITRAL VALVE MV Area (PHT): 2.62 cm     SHUNTS MV Decel Time:  289 msec     Systemic VTI:  0.25 m MV E velocity: 108.00 cm/s  Systemic Diam: 1.70 cm MV A velocity: 102.00 cm/s MV E/A ratio:  1.06 Mertie Moores MD Electronically signed by Mertie Moores MD Signature Date/Time: 05/23/2019/11:26:04 AM    Final    DG UGI W SMALL BOWEL  Result Date: 05/23/2019 CLINICAL DATA:  Bowel leak. EXAM: UPPER GI SERIES WITH KUB TECHNIQUE: After obtaining a scout radiograph a routine upper GI series was performed using water-soluble contrast only FLUOROSCOPY TIME:  Fluoroscopy Time:  4 minutes 36 seconds Radiation Exposure Index (if provided by the fluoroscopic device): 1.2 mGy Number of Acquired Spot Images: 4 COMPARISON:  CT study of May 21, 2019 and prior study from April 11, 2019 FINDINGS: Initial scout images show a surgical drain in the right upper quadrant and a pigtail drainage catheter near the midline. Study was performed only with water-soluble contrast. During the examination which was performed in the bilateral oblique and right lateral position the esophagus showed some evidence of tertiary peristalsis and a patulous appearance with moderate gastroesophageal reflux. Question of mild distal esophageal narrowing not well assessed. Stomach was grossly normal. Patient was placed in the right lateral position and contrast passed from the stomach into the small bowel. When contrast reached the level of the proximal jejunum a tract extended anteriorly towards the anterior abdominal wall and more distal small bowel loops began to fill, at least at 2 additional locations. Gas can be seen surrounding the drainage catheter. Paragraphs shortly after the tract extending from the jejunum is filled with contrast bowel loops to the right of the midline also began to fill and bowel loops inferior to the central abdominal collection. The at least 2 sites of communication or possible based on this study and based on review of previous imaging contrast did not fill the drain, likely passing  via route of least resistance into the bowel beyond. 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 Difficult to map given the nature the process in communication with proximal small bowel where the first site of communication likely involves the proximal jejunum. Follow-up CT could be performed with positive enteric contrast. Alternatively, waiting for clearance of current contrast followed by a CT after drain injection could be helpful to further map this complex process as clinically warranted. Injection of the drain followed by CT would give the added advantage of three-dimensional relationships and knowledge of what is in definitive communication with the collection. Electronically Signed   By: Zetta Bills M.D.   On: 05/23/2019 11:40    Anti-infectives: Anti-infectives (From admission, onward)   Start     Dose/Rate Route Frequency Ordered Stop   05/23/19 2200  vancomycin (VANCOCIN) IVPB 1000 mg/200 mL premix     1,000 mg 200 mL/hr over 60 Minutes Intravenous  Once 05/23/19 1345     05/23/19 1113  vancomycin variable dose per unstable renal function (pharmacist dosing)      Does not apply See admin instructions 05/23/19 1114     05/22/19 1500  vancomycin (VANCOREADY) IVPB 1250 mg/250 mL  Status:  Discontinued     1,250  mg 166.7 mL/hr over 90 Minutes Intravenous Every 24 hours 05/22/19 1429 05/23/19 1114       Assessment/Plan HTN DM Schizophrenia Stress urinary incontinence Bilateral BKAs Left middle finger amputation  Severe malnutrition - check prealbumin Code status DNR  MRSA bacteremia - per primary, PICC line out, on vancomycin  Perforated prepyloric ulcer S/p EXPLORATORY LAPAROTOMY WITH OVERSEW OF DUODENAL ULCER with [name] patch 02/03/2019 Dr. Lucia Gaskins [facility] Fistula - JP surgical drain and 1 IR drain  placed 12/16 in place with enteric contents  - UGI today shows fistulous connection at 2 points on the small bowel (proximal small bowel, suspect 1st site of communication is proximal jejunum) connecting to the known collection in the anterior abdomen and to the skin - may need drain injection studies to further characterize fistulas in the future, as well as EGD - continue strict I&O's of output from ECF and drain x2 - Will ask speech therapy to see for swallow evaluation. If she passes ok for diet as tolerated. Likely will not take in enough PO to meet her caloric needs, and will ultimately need PICC replaced and TPN restarted once bacteremia clears and once ok with ID.  ID - vancomycin 3/11>> FEN - NPO VTE - SCDs, lovenox Foley - in place Follow up - TBD   LOS: 1 day    Wellington Hampshire, [facility] 05/23/2019, 1:59 PM Please see Amion for pager number during day hours 7:00am-4:30pm
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
## 2019-05-23 NOTE — Progress Notes (Signed)
CBG 67, Speech has seen pt and has ordered a DSY 3 diet. Pt was able to drink apple juice. Will recheck CBG. MD [facility] notified via secure chat.   Paulla Fore, RN, BSN.

## 2019-05-24 ENCOUNTER — Inpatient Hospital Stay (HOSPITAL_COMMUNITY): Payer: Medicare Other

## 2019-05-24 LAB — URINE CULTURE: Culture: >=100000 — AB

## 2019-05-24 LAB — CBC
HCT: 22.1 % — ABNORMAL LOW (ref 36.0–46.0)
Hemoglobin: 7 g/dL — ABNORMAL LOW (ref 12.0–15.0)
MCH: 29.9 pg (ref 26.0–34.0)
MCHC: 31.7 g/dL (ref 30.0–36.0)
MCV: 94.4 fL (ref 80.0–100.0)
Platelets: 226 10*3/uL (ref 150–400)
RBC: 2.34 MIL/uL — ABNORMAL LOW (ref 3.87–5.11)
RDW: 15 % (ref 11.5–15.5)
WBC: 8.3 10*3/uL (ref 4.0–10.5)
nRBC: 0 % (ref 0.0–0.2)

## 2019-05-24 LAB — COMPREHENSIVE METABOLIC PANEL
ALT: 35 U/L (ref 0–44)
AST: 43 U/L — ABNORMAL HIGH (ref 15–41)
Albumin: 1.5 g/dL — ABNORMAL LOW (ref 3.5–5.0)
Alkaline Phosphatase: 150 U/L — ABNORMAL HIGH (ref 38–126)
Anion gap: 10 (ref 5–15)
BUN: 38 mg/dL — ABNORMAL HIGH (ref 6–20)
CO2: 19 mmol/L — ABNORMAL LOW (ref 22–32)
Calcium: 8 mg/dL — ABNORMAL LOW (ref 8.9–10.3)
Chloride: 106 mmol/L (ref 98–111)
Creatinine, Ser: 1.01 mg/dL — ABNORMAL HIGH (ref 0.44–1.00)
GFR calc Af Amer: >60 mL/min (ref >60)
GFR calc non Af Amer: >60 mL/min (ref >60)
Glucose, Bld: 90 mg/dL (ref 70–99)
Potassium: 4.7 mmol/L (ref 3.5–5.1)
Sodium: 135 mmol/L (ref 135–145)
Total Bilirubin: 1.8 mg/dL — ABNORMAL HIGH (ref 0.3–1.2)
Total Protein: 5.9 g/dL — ABNORMAL LOW (ref 6.5–8.1)

## 2019-05-24 LAB — CULTURE, BLOOD (ROUTINE X 2)

## 2019-05-24 LAB — GLUCOSE, CAPILLARY
Glucose-Capillary: 94 mg/dL (ref 70–99)
Glucose-Capillary: 84 mg/dL (ref 70–99)
Glucose-Capillary: 91 mg/dL (ref 70–99)
Glucose-Capillary: 93 mg/dL (ref 70–99)
Glucose-Capillary: 112 mg/dL — ABNORMAL HIGH (ref 70–99)
Glucose-Capillary: 118 mg/dL — ABNORMAL HIGH (ref 70–99)

## 2019-05-24 LAB — PREALBUMIN: Prealbumin: 7.1 mg/dL — ABNORMAL LOW (ref 18–38)

## 2019-05-24 MED ORDER — VANCOMYCIN HCL 750 MG/150ML IV SOLN
750.0000 mg | INTRAVENOUS | Status: DC
  Administered 2019-05-24: 750 mg via INTRAVENOUS
  Filled 2019-05-24: qty 150

## 2019-05-24 NOTE — Progress Notes (Signed)
  Speech Language Pathology Treatment: Dysphagia  Patient Details Name: Gina [name] MRN: [id number] DOB: 13-Jul-1960 Today's Date: 05/24/2019 Time: 7371-0626 SLP Time Calculation (min) (ACUTE ONLY): 13 min  Assessment / Plan / Recommendation Clinical Impression  Followed up at bedside for need for MBS per previous SLP recommendations. At bedside, patient providing conflicting information regarding swallowing history, unable to recall results of MBS complete during stay at [facility] and unfortunately, no notes available from that stay. Patient also unable to recall reason why she was NPO at SNF however she reports being on TPN there and reports that at some time " food was going into my lungs.". SLP provided thin liquids which again resulted in what appears to be normal oropharyngeal swallowing function. Given complicated history with possible aspiration, will procees with MBS this morning to determine potential to initiate a po diet.   HPI HPI: Gina [name] is a 59 y.o. F with hx DM, HTN, bilateral BKA, schizophrenia, and recent perforated ulcer leading to septic shock, DKA, fungemia, and persistent peritoneal abscess who presents with malaise, found to have MRSA bacteremia. Patient admitted in Nov 2020 for septic shock, found to have acute peritonitis. [facility] B[facility], and anidulafungin for total 6 weeks. Based on chart review, patient was seen for dysphagia and participated in [facility] 03/2019 while at [facility]. However, full notes not available. She was then discharged from [facility] to [facility], where she has been for last three months.  Pt now admitted with MRSA. CXR 3/10: Bibasilar atelectasis or infiltrates, right greater than left. Small right pleural effusion.      SLP Plan  MBS       Recommendations  Diet recommendations: NPO Medication Administration: Via alternative means                Oral Care Recommendations: Oral care QID SLP  Visit Diagnosis: Dysphagia, unspecified (R13.10) Plan: MBS       GO             Gina Schreier MA, CCC-SLP     Gina [name] 05/24/2019, 9:52 AM

## 2019-05-24 NOTE — Progress Notes (Signed)
Palliative Medicine Inpatient Follow Up Note   HPI: Hospitalist H&P -->Gina McCainis a 59 y.o.Fwith hx DM, HTN, bilateral BKA, schizophrenia, and recent perforated ulcer leading to septic shock, DKA,fungemia,and persistent peritoneal abscesswho presents with malaise, found to have MRSA bacteremia.  3/12 - Palliative Care was consulted to help with ongoing goals of care conversations.  Today's Discussion (05/24/19): Chart reviewed. S/P swallow evaluation yesterday has been recommended to maintain NPO status as she appeared to have functional limites with most consistencies. An MBSS was identified as indicated.  Patient evaluated at bedside. She reports feeling well. She states that she is not able to eat yet but is hopeful that she will be able to moving forward. She expresses that her goal is to get to a friends wedding on April 27th. She remains quite optimistic for improvement. She is noted to be listening to [employer] which has lifted her mood.   Discussed with patient the importance of continued conversation with family and their  medical providers regarding overall plan of care and treatment options, ensuring decisions are within the context of the patients values and GOCs.  Questions and concerns addressed   Vital Signs Vitals:   05/23/19 2114 05/24/19 0447  BP: 133/71 127/75  Pulse: 79 70  Resp: 16 14  Temp: 98.8 F (37.1 C) 97.7 F (36.5 C)  SpO2: 96% 100%    Intake/Output Summary (Last 24 hours) at 05/24/2019 0816 Last data filed at 05/24/2019 0806 Gross per 24 hour  Intake 554.61 ml  Output 1710 ml  Net -1155.39 ml   Last Weight  Most recent update: 05/22/2019  9:26 PM   Weight  54.6 kg (120 lb 5.9 oz)           Physical Exam Vitals and nursing note reviewed.  HENT:     Head: Normocephalic.     Nose: Nose normal.     Mouth/Throat:     Mouth: Mucous membranes are dry.  Eyes:     Pupils: Pupils are equal, round, and reactive to light.    Cardiovascular:     Rate and Rhythm: Normal rate and regular rhythm.     Pulses: Normal pulses.     Comments: Bilateral BKAs Pulmonary:     Effort: Pulmonary effort is normal.  Abdominal:     Palpations: Abdomen is soft.     Comments: (+) Colostomy (+) IR Tubes  Musculoskeletal:     Cervical back: Normal range of motion.     Comments: Can move upper extremities, noted to have partial contractures in hands  Skin:    General: Skin is dry.     Capillary Refill: Capillary refill takes less than 2 seconds.  Neurological:     General: No focal deficit present.     Mental Status: She is alert and oriented to person, place, and time.  Psychiatric:        Mood and Affect: Mood normal.   SUMMARY OF RECOMMENDATIONS   DNAR/DNI, Treat the treatable  MOST completed --> Place in chart  DNR completed --> Placed on chart  OP Palliative FU  Goal for physical improvement  Physical deconditioning --> resumption of PT/OT  Plan for swallow evaluation today  Chaplain Consult  Wound team involved  Dysphagia --> MBSS  Time Spent: 25 Greater than 50% of the time was spent in counseling and coordination of care ______________________________________________________________________________________ [address] Team Team Cell Phone: [phone] Please utilize secure chat with additional questions, if there is  no response within 30 minutes please call the above phone number  Palliative Medicine Team providers are available by phone from 7am to 7pm daily and can be reached through the team cell phone.  Should this patient require assistance outside of these hours, please call the patient's attending physician.     

## 2019-05-24 NOTE — Progress Notes (Addendum)
Modified Barium Swallow Progress Note  Patient Details  Name: Gina [name] MRN: [id number] Date of Birth: Nov 10, 1960  Today's Date: 05/24/2019  Note incorrect time entered for study. Completed [id number].   Modified Barium Swallow completed.  Full report located under Chart Review in the Imaging Section.  Brief recommendations include the following:  Clinical Impression  Patient presensts with a largely normal, age appropriate, oropharyngeal swallow. Oral phase timely with swift oral transit of bolus. Swallow triggered mostly at the vallecula with one episode of frank penetration of thin liquids which remained under the epiglottis post swallow however cleared spontaneously with subsequent swallows before reaching the level of the vocal cords. No additional episodes of penetration or aspiration despite being challenged with multiple consecutive straw sips of liquid. Note that patient unable to orally  transit pill with thin liquids, able with pureed solids, however no trouble reported at bedside with pills by patient or RN. Recommend initiation of regular texture solids, thin liquid (per MD, "diet as tolerated"). SLP will f/u briefly for tolerance.    Swallow Evaluation Recommendations       SLP Diet Recommendations: Regular solids;Thin liquid   Liquid Administration via: Cup;Straw   Medication Administration: Whole meds with liquid   Supervision: Staff to assist with self feeding   Compensations: Slow rate;Small sips/bites   Postural Changes: Seated upright at 90 degrees   Oral Care Recommendations: Oral care BID       Rajveer Handler MA, CCC-SLP   Bulah Lurie Meryl 05/24/2019,11:10 AM

## 2019-05-24 NOTE — Progress Notes (Signed)
RN noticed RLQ JP drain is leaking yellow green drainage and the tubing has came out a little bit due to the position of the stitch. On call NP, Blount, notified. RN will also make oncoming RN aware.   Celestia Khat

## 2019-05-24 NOTE — Progress Notes (Signed)
OT Cancellation Note  Patient Details Name: Gina [name] MRN: [id number] DOB: May 10, 1960   Cancelled Treatment:    Reason Eval/Treat Not Completed: Patient at procedure or test/ unavailable; pt currently out of room. Will follow up for OT eval as able.  Lou Cal, OT Acute Rehabilitation Services Pager [phone] Office [phone]   Raymondo Band 05/24/2019, 11:09 AM

## 2019-05-24 NOTE — Evaluation (Signed)
Occupational Therapy Evaluation Patient Details Name: Gina [name] MRN: [id number] DOB: September 07, 1960 Today's Date: 05/24/2019    History of Present Illness Pt is a 59 y/o female admitted from SNF secondary to abnormal lab values. Found to have MRSA bacteremia. Pt also with abdominal fistula and multiple abdominal drains secondary to previous complicated hospital admission. PMH includes HTN, DM, bilateral BKA, and schizophrenia.    Clinical Impression   This 59 y/o female presents with the above. Pt most recently in SNF PTA, reports staff assisting with ADL tasks and use of hoyer for OOB to wheelchair. Pt presenting with pain, weakness, and fluctuating cognition. Pt currently requires mod-maxA for bed mobility, rolling to L and R for completion of pericare and changing bed linens after episode of incontinent/BM. She currently requires totalA for pericare and LB ADL, minA for UB ADL. Pt with intermittent confusion during session completion. She will benefit from continued acute OT services, currently recommend continued therapy services in SNF setting after discharge to maximize her safety and independence with ADL and mobility (though pt verbalizing wishes to return home); will follow.     Follow Up Recommendations  SNF;Supervision/Assistance - 24 hour(pt reports wishes for home)    Equipment Recommendations  Other (comment);Wheelchair (measurements OT);Wheelchair cushion (measurements OT);Hospital bed(if returning home, will require hoyer)           Precautions / Restrictions Precautions Precautions: Fall;Other (comment) Precaution Comments: bilateral BKA  Restrictions Weight Bearing Restrictions: No      Mobility Bed Mobility Overal bed mobility: Needs Assistance Bed Mobility: Rolling Rolling: Mod assist;Max assist;+2 for safety/equipment         General bed mobility comments: rolling to L and R for pericare, changing of bed pads   Transfers                 General  transfer comment: Dependent at baseline         ADL either performed or assessed with clinical judgement   ADL Overall ADL's : Needs assistance/impaired Eating/Feeding: NPO   Grooming: Wash/dry face;Set up;Bed level   Upper Body Bathing: Minimal assistance;Bed level   Lower Body Bathing: Maximal assistance;Total assistance;Bed level   Upper Body Dressing : Minimal assistance;Bed level   Lower Body Dressing: Maximal assistance;Total assistance;Bed level       Toileting- Clothing Manipulation and Hygiene: Total assistance;+2 for physical assistance;+2 for safety/equipment;Bed level Toileting - Clothing Manipulation Details (indicate cue type and reason): pt incontinent of BM during session requiring assist for pericare                                 Pertinent Vitals/Pain Pain Assessment: Faces Faces Pain Scale: Hurts even more Pain Location: abdomen Pain Descriptors / Indicators: Grimacing;Guarding Pain Intervention(s): Limited activity within patient's tolerance;Monitored during session;Repositioned     Hand Dominance     Extremity/Trunk Assessment Upper Extremity Assessment Upper Extremity Assessment: Generalized weakness;RUE deficits/detail RUE Deficits / Details: pt reports painful RUE, decreased ROM RUE: Unable to fully assess due to pain RUE Coordination: decreased gross motor   Lower Extremity Assessment Lower Extremity Assessment: Defer to PT evaluation       Communication Communication Communication: No difficulties   Cognition Arousal/Alertness: Awake/alert Behavior During Therapy: WFL for tasks assessed/performed Overall Cognitive Status: No family/caregiver present to determine baseline cognitive functioning  General Comments: waxing/waning cognition with periods of confusion   General Comments  during initial part of pericare noted JP drain dislodged from pt abdomen, RN present and made  aware     Exercises     Shoulder Instructions      Home Living Family/patient expects to be discharged to:: Skilled nursing facility                                        Prior Functioning/Environment Level of Independence: Needs assistance  Gait / Transfers Assistance Needed: Requires hoyer lift for transfers.  ADL's / Homemaking Assistance Needed: Requires assist with bathing/dressing at SNF             OT Problem List: Decreased strength;Decreased range of motion;Decreased activity tolerance;Decreased cognition;Decreased knowledge of use of DME or AE;Pain      OT Treatment/Interventions: Self-care/ADL training;Therapeutic exercise;DME and/or AE instruction;Therapeutic activities;Cognitive remediation/compensation;Patient/family education;Balance training    OT Goals(Current goals can be found in the care plan section) Acute Rehab OT Goals Patient Stated Goal: wants to go home, to see her great grandchild OT Goal Formulation: With patient Time For Goal Achievement: 06/07/19 Potential to Achieve Goals: Good  OT Frequency: Min 2X/week   Barriers to D/C:            Co-evaluation              AM-PAC OT "6 Clicks" Daily Activity     Outcome Measure Help from another person eating meals?: Total(NPO) Help from another person taking care of personal grooming?: A Lot Help from another person toileting, which includes using toliet, bedpan, or urinal?: Total Help from another person bathing (including washing, rinsing, drying)?: A Lot Help from another person to put on and taking off regular upper body clothing?: A Lot Help from another person to put on and taking off regular lower body clothing?: Total 6 Click Score: 9   End of Session Nurse Communication: Mobility status  Activity Tolerance: Patient tolerated treatment well;Patient limited by pain Patient left: in bed;with call bell/phone within reach;with nursing/sitter in room  OT Visit  Diagnosis: Muscle weakness (generalized) (M62.81);Pain;Other abnormalities of gait and mobility (R26.89) Pain - part of body: (abdomen)                Time: 7915-0569 OT Time Calculation (min): 38 min Charges:  OT General Charges $OT Visit: 1 Visit OT Evaluation $OT Eval Moderate Complexity: 1 Mod OT Treatments $Self Care/Home Management : 23-37 mins  Lou Cal, OT Acute Rehabilitation Services Pager [phone] Office [place] 05/24/2019, 4:43 PM

## 2019-05-24 NOTE — Progress Notes (Signed)
Pharmacy Antibiotic Note  Gina [name] is a 59 y.o. female called back in for admission on 05/22/2019 with MRSA bacteremia. Pharmacy has been consulted for Vancomycin dosing.  The patient was noted to have a mild AKI with SCr trending up to 1.11 (baseline appears to be between 0.4-0.5). SCr is now trending down.    Plan: - Start Vancomycin 750 mg q24h at 2200 tonight - Calculated AUC 431 (goal 400-550) with SCr 1 - Will continue to follow renal function, culture results, LOT, and antibiotic de-escalation plans   Height: 5\' 2"  (157.5 cm) Weight: 120 lb 5.9 oz (54.6 kg) IBW/kg (Calculated) : 50.1  Temp (24hrs), Avg:98.3 F (36.8 C), Min:97.7 F (36.5 C), Max:98.8 F (37.1 C)  Recent Labs  Lab 05/21/19 1038 05/22/19 1600 05/22/19 1633 05/23/19 0534 05/23/19 1248 05/24/19 0522 05/24/19 0747  WBC 12.5* 11.9*  --  9.7  --   --  8.3  CREATININE 0.77 0.88  --  1.11*  --  1.01*  --   LATICACIDVEN 1.5  --  1.2  --   --   --   --   VANCORANDOM  --   --   --   --  23  --   --     Estimated Creatinine Clearance: 48 mL/min (A) (by C-G formula based on SCr of 1.01 mg/dL (H)).    Allergies  Allergen Reactions  . Bee Venom Swelling    Swells up with bee stings   . Metformin And Related Other (See Comments)    Upset stomach    Antimicrobials this admission: Zosyn 3/10 x 1 Vanc 3/10 >>  Dose adjustments this admission: - 3/12 VR: 23 mcg/ml - est Vanc clearance 0.69 mcg/ml per hr, est level at 2200 ~16, will give 1g x 1, no standing doses  Microbiology results: 3/10 UCx >> PSA + E faecium - asymptomatic? F/u w/ ID 3/10 BCx >> 2/2 MRSA 3/11 COVID >> neg 3/12 BCx >>   Thank you for allowing pharmacy to be a part of this patient's care.  [employer], Pharm.D., BCPS Clinical Pharmacist Clinical phone for 05/24/2019 from 8:30-4:00 is [phone].  **Pharmacist phone directory can be found on [email or web address] listed under [place].  05/24/2019 11:53 AM

## 2019-05-24 NOTE — Plan of Care (Signed)
  Problem: Education: Goal: Knowledge of General Education information will improve Description Including pain rating scale, medication(s)/side effects and non-pharmacologic comfort measures Outcome: Progressing   

## 2019-05-24 NOTE — Progress Notes (Signed)
PROGRESS NOTE    Gina [name]  SWF:[id number] DOB: 12-20-1960 DOA: 05/22/2019 PCP: [name], MD   Brief Narrative: 59 year old female with DM, HTN, schizophrenia, bilateral BKA sent from [facility] due to positive blood culture.  Recent admission November 2020 for septic shock with acute peritonitis status post exlap with [name] patch on 11/23 subsequent blood culture grew Candida glabrata peritoneal fluid grew Candida albicans frequent adrenal crystalloid current protein per collection showed Candida glabrata Enterococcus faecalis stenotrophomonas patient was discharged to select LTAC on Unasyn Bactrim and antifungal for total 6 weeks.  Patient was discharged from [place] to [facility] whiere she has been living for last 3 months. Patient was seen in the ER for vague sleepiness malaise anorexia low-grade fever and leukocytosis at SNF, and in the ER exam labs unremarkable and patient was sent back to SNF but overnight blood culture returned MRSA 2/2 and she was readmitted to the hospital.  Subjective:  Seen/examined And examined ttis morning.  Appears pleasant alert awake.  No new complaints. Overnight no fever, 100% on room air, blood pressure stable RLQ JP drain was leaking with yellow-green drainage in tubing had come out little bit due to pushing of the stitch. Labs hemoglobin 7.0.  BUN/creatinine stable.   Assessment & Plan:  MRSA bacteremia: indwelling PICC line used for TPN is felt likely possible source.  Appreciate ID on board. Plan is to continue vancomycin repeat blood cultures 3/12-result pending, TTE negative for endocarditis. She has enterocutaneous fistula and drains  per Surgery speech eval ordered to see patient can eat orally , PICC line to be removed hopefully patient can start eating orally and thus no longer needing TPN.  She has b/l stumps wound. Cont  wound care.  Intra-abdominal fluid collection with recent ex lap/partial small bowel resection and  intra-abdominal abscess in November.General surgery following, right-sided has mild leaking and drain slightly pulled out, will have surgery follow-up for Recs.Continue to drain management.    HTN: BP well controlled , cont amlodipine.  Metabolic acidosis/elevated BUN/dehydration: Continue on IV fluid hydration BUN decreasing.  Trend labs.  Diabetes mellitus: hbA1c 5.9 3/12.  With hypoglycemia blood sugar 67 3/12, discontinued Lantus and  Will cont D5 NS.Continue to monitor blood sugar.   Severe protein-calorie malnutrition: TPN dependent, dietitian on consult.  Augment nutrition as tolerated, speech consulted to see patient is able to eat orally, noted plan for MBS.  Follow-up surgery/ SLP recommendation for enteral feed.  Anemia of chronic disease: Previously hemoglobin has been ranging from 9 to 8 g, and drifting downward.  Suspect multifactorial with chronic disease, no obvious bleeding present. HB low at 7.0., order anemia panel, monitor hemoglobin, transfuse for less than 7 g.  Below-knee amputee/Atherosclerosis of native arteries: Stump evaluation by wound car Bilateral stumps are being evaluated by wound care.  Transaminitis: Suspect from TPN.  Monitor closely.  Asymptomatic bacteriuria: replaceD Foley  GERD: on PPI  Mood disorder:on Celexa  Goals of care: Palliative care consulted patient is DNR MOST form filled.  Per recommendation to treat what is treatable and plan on palliative care referral upon discharge. Given significant comorbidities malnutrition high risk for readmission and decompensation.  Interventions: Refer to RD note for recommendations Body mass index is 22.02 kg/m.  Pressure Ulcer: Pressure Injury 05/22/19 Coccyx Bilateral Stage 2 -  Partial thickness loss of dermis presenting as a shallow open injury with a red, pink wound bed without slough. (Active)  05/22/19 2104  Location: Coccyx  Location Orientation: Bilateral  Staging:  Stage 2 -  Partial thickness  loss of dermis presenting as a shallow open injury with a red, pink wound bed without slough.  Wound Description (Comments):   Present on Admission: Yes     Pressure Injury 05/22/19 Knee Right;Distal Unstageable - Full thickness tissue loss in which the base of the injury is covered by slough (yellow, tan, gray, green or brown) and/or eschar (tan, brown or black) in the wound bed. Stump (Active)  05/22/19 2106  Location: Knee  Location Orientation: Right;Distal  Staging: Unstageable - Full thickness tissue loss in which the base of the injury is covered by slough (yellow, tan, gray, green or brown) and/or eschar (tan, brown or black) in the wound bed.  Wound Description (Comments): Stump  Present on Admission: Yes     Pressure Injury 05/22/19 Knee Left;Distal Unstageable - Full thickness tissue loss in which the base of the injury is covered by slough (yellow, tan, gray, green or brown) and/or eschar (tan, brown or black) in the wound bed. Stump (Active)  05/22/19 2108  Location: Knee  Location Orientation: Left;Distal  Staging: Unstageable - Full thickness tissue loss in which the base of the injury is covered by slough (yellow, tan, gray, green or brown) and/or eschar (tan, brown or black) in the wound bed.  Wound Description (Comments): Stump  Present on Admission: Yes     Pressure Injury 05/22/19 Thigh Left;Mid Stage 2 -  Partial thickness loss of dermis presenting as a shallow open injury with a red, pink wound bed without slough. (Active)  05/22/19 2109  Location: Thigh  Location Orientation: Left;Mid  Staging: Stage 2 -  Partial thickness loss of dermis presenting as a shallow open injury with a red, pink wound bed without slough.  Wound Description (Comments):   Present on Admission: Yes   DVT prophylaxis:lovenox Code Status: DNR Family Communication: plan of care discussed with patient at bedside.  Reports her daughter is aware of the plan. Disposition Plan: Patient is from:  SNF Anticipated Disposition:? to home with home and services.  Patient reports she is not going back to skilled nursing facility.   Barriers to discharge or conditions that needs to be met prior to discharge: Patient remains hospitalized for MRSA bacteremia treatment and further work-up and source control and enteral nutrition assessment, a speech evaluation for diet.  Consultants: Infectious disease, general surgery Procedures:  2D ECHO 1. Left ventricular ejection fraction, by estimation, is 55 to 60%. The  left ventricle has normal function. The left ventricle has no regional  wall motion abnormalities. Left ventricular diastolic parameters were  normal.  2. Right ventricular systolic function is normal. The right ventricular  size is normal.  3. The mitral valve is normal in structure. No evidence of mitral valve  regurgitation. No evidence of mitral stenosis.  4. The aortic valve is normal in structure. Aortic valve regurgitation is  not visualized. No aortic stenosis is present.   Microbiology: Blood culture with MRSA 05/21/19  Medications: Scheduled Meds: . amLODipine  5 mg Oral Daily  . chlorhexidine  15 mL Mouth Rinse BID  . Chlorhexidine Gluconate Cloth  6 each Topical Daily  . enoxaparin (LOVENOX) injection  40 mg Subcutaneous Q24H  . escitalopram  10 mg Oral Daily  . feeding supplement (ENSURE ENLIVE)  237 mL Oral TID BM  . feeding supplement (PRO-STAT SUGAR FREE 64)  30 mL Oral BID WC  . gabapentin  300 mg Oral BID  . insulin aspart  0-9 Units Subcutaneous  Q6H  . mouth rinse  15 mL Mouth Rinse q12n4p  . Melatonin  3 mg Oral QHS  . metoprolol tartrate  50 mg Oral BID  . nystatin  5 mL Oral QID  . pantoprazole  40 mg Oral Daily  . simethicone  80 mg Oral QID  . vancomycin variable dose per unstable renal function (pharmacist dosing)   Does not apply See admin instructions   Continuous Infusions: . dextrose 5 % and 0.9% NaCl 50 mL/hr at 05/23/19 1800     Antimicrobials: Anti-infectives (From admission, onward)   Start     Dose/Rate Route Frequency Ordered Stop   05/23/19 2200  vancomycin (VANCOCIN) IVPB 1000 mg/200 mL premix     1,000 mg 200 mL/hr over 60 Minutes Intravenous  Once 05/23/19 1345 05/24/19 0008   05/23/19 1113  vancomycin variable dose per unstable renal function (pharmacist dosing)      Does not apply See admin instructions 05/23/19 1114     05/22/19 1500  vancomycin (VANCOREADY) IVPB 1250 mg/250 mL  Status:  Discontinued     1,250 mg 166.7 mL/hr over 90 Minutes Intravenous Every 24 hours 05/22/19 1429 05/23/19 1114       Objective: Vitals: Today's Vitals   05/23/19 1647 05/23/19 2114 05/23/19 2239 05/24/19 0447  BP: (!) 143/77 133/71  127/75  Pulse: 80 79  70  Resp: 18 16  14   Temp: 98.3 F (36.8 C) 98.8 F (37.1 C)  97.7 F (36.5 C)  TempSrc: Oral Oral  Oral  SpO2: 96% 96%  100%  Weight:      Height:      PainSc:   0-No pain     Intake/Output Summary (Last 24 hours) at 05/24/2019 0755 Last data filed at 05/24/2019 0614 Gross per 24 hour  Intake 554.61 ml  Output 1710 ml  Net -1155.39 ml   Filed Weights   05/22/19 2100  Weight: 54.6 kg   Weight change:    Intake/Output from previous day: 03/12 0701 - 03/13 0700 In: 554.6 [I.V.:554.6] Out: 1710 [Urine:1175; Drains:10; Stool:525] Intake/Output this shift: No intake/output data recorded.  Examination:  General exam: Alert awake oriented x3, ill frail and cachectic.   HEENT:Oral mucosa moist, Ear/Nose WNL grossly,dentition normal. Respiratory system: bilaterally clear,no use of accessory muscle, non tender. Cardiovascular system: S1 & S2 +, regular, No JVD. Gastrointestinal system: Abdomen soft, right lower quadrant drain is slightly pulled out mild greenish drainage +, enterocutaneous fistula present with costomy bag in place, Foley catheter present.  Nervous System:Alert, awake, moving extremities and grossly nonfocal Extremities: No  edema, distal peripheral pulses palpable.  Skin: No rashes,no icterus. MSK: BKA bilaterally with a stump with wounds.    Data Reviewed: I have personally reviewed following labs and imaging studies CBC: Recent Labs  Lab 05/21/19 1038 05/22/19 1600 05/23/19 0534  WBC 12.5* 11.9* 9.7  NEUTROABS 9.5*  --   --   HGB 8.2* 7.6* 7.1*  HCT 26.0* 24.1* 22.1*  MCV 97.4 94.5 94.0  PLT 172 186 672   Basic Metabolic Panel: Recent Labs  Lab 05/21/19 1038 05/22/19 1600 05/23/19 0534  NA 134* 135 137  K 4.0 4.3 3.9  CL 104 101 102  CO2 22 25 23   GLUCOSE 158* 243* 109*  BUN 37* 46* 45*  CREATININE 0.77 0.88 1.11*  CALCIUM 8.7* 8.6* 8.7*   GFR: Estimated Creatinine Clearance: 43.7 mL/min (A) (by C-G formula based on SCr of 1.11 mg/dL (H)). Liver Function Tests: Recent Labs  Lab  05/21/19 1038 05/22/19 1600 05/23/19 0534  AST 55* 42* 36  ALT 46* 56* 46*  ALKPHOS 179* 181* 169*  BILITOT 0.7 1.3* 1.5*  PROT 6.7 7.0 6.8  ALBUMIN 1.6* 1.7* 1.6*   No results for input(s): LIPASE, AMYLASE in the last 168 hours. No results for input(s): AMMONIA in the last 168 hours. Coagulation Profile: No results for input(s): INR, PROTIME in the last 168 hours. Cardiac Enzymes: No results for input(s): CKTOTAL, CKMB, CKMBINDEX, TROPONINI in the last 168 hours. BNP (last 3 results) No results for input(s): PROBNP in the last 8760 hours. HbA1C: Recent Labs    05/23/19 0534  HGBA1C 5.9*   CBG: Recent Labs  Lab 05/23/19 1754 05/23/19 2109 05/24/19 0105 05/24/19 0354 05/24/19 0725  GLUCAP 77 81 94 84 91   Lipid Profile: No results for input(s): CHOL, HDL, LDLCALC, TRIG, CHOLHDL, LDLDIRECT in the last 72 hours. Thyroid Function Tests: No results for input(s): TSH, T4TOTAL, FREET4, T3FREE, THYROIDAB in the last 72 hours. Anemia Panel: Recent Labs    05/21/19 1038  VITAMINB12 679  FOLATE 23.5  FERRITIN 574*  TIBC 143*  IRON 5*  RETICCTPCT 1.7   Sepsis Labs: Recent Labs  Lab  05/21/19 1038 05/22/19 1633  LATICACIDVEN 1.5 1.2    Recent Results (from the past 240 hour(s))  Urine Culture     Status: Abnormal   Collection Time: 05/21/19 10:00 AM   Specimen: Urine, Catheterized  Result Value Ref Range Status   Specimen Description URINE, CATHETERIZED  Final   Special Requests   Final    NONE Performed at [facility], [place] [address] [place], [place] [address]    Culture (A)  Final    >=100,000 COLONIES/mL PSEUDOMONAS AERUGINOSA 50,000 COLONIES/mL VANCOMYCIN RESISTANT ENTEROCOCCUS ISOLATED    Report Status 05/24/2019 FINAL  Final   Organism ID, Bacteria PSEUDOMONAS AERUGINOSA (A)  Final   Organism ID, Bacteria VANCOMYCIN RESISTANT ENTEROCOCCUS ISOLATED (A)  Final      Susceptibility   Pseudomonas aeruginosa - MIC*    CEFTAZIDIME 4 SENSITIVE Sensitive     CIPROFLOXACIN <=0.25 SENSITIVE Sensitive     GENTAMICIN <=1 SENSITIVE Sensitive     IMIPENEM 1 SENSITIVE Sensitive     PIP/TAZO <=4 SENSITIVE Sensitive     CEFEPIME 2 SENSITIVE Sensitive     * >=100,000 COLONIES/mL PSEUDOMONAS AERUGINOSA   Vancomycin resistant enterococcus isolated - MIC*    AMPICILLIN >=32 RESISTANT Resistant     NITROFURANTOIN 256 RESISTANT Resistant     VANCOMYCIN >=32 RESISTANT Resistant     LINEZOLID 2 SENSITIVE Sensitive     * 50,000 COLONIES/mL VANCOMYCIN RESISTANT ENTEROCOCCUS ISOLATED  Respiratory Panel by RT PCR (Flu A&B, Covid) - Nasopharyngeal Swab     Status: None   Collection Time: 05/21/19 10:03 AM   Specimen: Nasopharyngeal Swab  Result Value Ref Range Status   SARS Coronavirus 2 by RT PCR NEGATIVE NEGATIVE Final    Comment: (NOTE) SARS-CoV-2 target nucleic acids are NOT DETECTED. The SARS-CoV-2 RNA is generally detectable in upper respiratoy specimens during the acute phase of infection. The lowest concentration of SARS-CoV-2 viral copies this assay can detect is 131 copies/mL. A negative result does not preclude SARS-Cov-2 infection and should not be  used as the sole basis for treatment or other patient management decisions. A negative result may occur with  improper specimen collection/handling, submission of specimen other than nasopharyngeal swab, presence of viral mutation(s) within the areas targeted by this assay, and inadequate number of  viral copies (<131 copies/mL). A negative result must be combined with clinical observations, patient history, and epidemiological information. The expected result is Negative. Fact Sheet for Patients:  PinkCheek.be Fact Sheet for Healthcare Providers:  GravelBags.it This test is not yet ap proved or cleared by the [place] FDA and  has been authorized for detection and/or diagnosis of SARS-CoV-2 by FDA under an Emergency Use Authorization (EUA). This EUA will remain  in effect (meaning this test can be used) for the duration of the COVID-19 declaration under Section 564(b)(1) of the Act, 21 U.S.C. section 360bbb-3(b)(1), unless the authorization is terminated or revoked sooner.    Influenza A by PCR NEGATIVE NEGATIVE Final   Influenza B by PCR NEGATIVE NEGATIVE Final    Comment: (NOTE) The Xpert Xpress SARS-CoV-2/FLU/RSV assay is intended as an aid in  the diagnosis of influenza from Nasopharyngeal swab specimens and  should not be used as a sole basis for treatment. Nasal washings and  aspirates are unacceptable for Xpert Xpress SARS-CoV-2/FLU/RSV  testing. Fact Sheet for Patients: PinkCheek.be Fact Sheet for Healthcare Providers: GravelBags.it This test is not yet approved or cleared by the [place] FDA and  has been authorized for detection and/or diagnosis of SARS-CoV-2 by  FDA under an Emergency Use Authorization (EUA). This EUA will remain  in effect (meaning this test can be used) for the duration of the  Covid-19 declaration under Section 564(b)(1) of the  Act, 21  U.S.C. section 360bbb-3(b)(1), unless the authorization is  terminated or revoked. Performed at [facility], [place] [address], [place], [place] [address]   Blood culture (routine x 2)     Status: Abnormal   Collection Time: 05/21/19 10:38 AM   Specimen: BLOOD RIGHT ARM  Result Value Ref Range Status   Specimen Description BLOOD RIGHT ARM  Final   Special Requests   Final    AEROBIC BOTTLE ONLY Blood Culture results may not be optimal due to an inadequate volume of blood received in culture bottles   Culture  Setup Time   Final    GRAM POSITIVE COCCI IN CLUSTERS AEROBIC BOTTLE ONLY CRITICAL RESULT CALLED TO, READ BACK BY AND VERIFIED WITH: Macky Lower RN, AT [phone] 05/22/19 BY Rush Landmark Performed at [facility], [place] [address] [place], [place] [address]    Culture METHICILLIN RESISTANT STAPHYLOCOCCUS AUREUS (A)  Final   Report Status 05/24/2019 FINAL  Final   Organism ID, Bacteria METHICILLIN RESISTANT STAPHYLOCOCCUS AUREUS  Final      Susceptibility   Methicillin resistant staphylococcus aureus - MIC*    CIPROFLOXACIN >=8 RESISTANT Resistant     ERYTHROMYCIN <=0.25 SENSITIVE Sensitive     GENTAMICIN <=0.5 SENSITIVE Sensitive     OXACILLIN >=4 RESISTANT Resistant     TETRACYCLINE <=1 SENSITIVE Sensitive     VANCOMYCIN 1 SENSITIVE Sensitive     TRIMETH/SULFA <=10 SENSITIVE Sensitive     CLINDAMYCIN <=0.25 SENSITIVE Sensitive     RIFAMPIN <=0.5 SENSITIVE Sensitive     Inducible Clindamycin NEGATIVE Sensitive     * METHICILLIN RESISTANT STAPHYLOCOCCUS AUREUS  Blood culture (routine x 2)     Status: Abnormal   Collection Time: 05/21/19 10:38 AM   Specimen: BLOOD RIGHT HAND  Result Value Ref Range Status   Specimen Description BLOOD RIGHT HAND  Final   Special Requests   Final    AEROBIC BOTTLE ONLY Blood Culture results may not be optimal due to an inadequate volume of blood received in culture  bottles   Culture  Setup Time   Final    AEROBIC BOTTLE ONLY GRAM  POSITIVE COCCI IN CLUSTERS CRITICAL RESULT CALLED TO, READ BACK BY AND VERIFIED WITH: K NEAL RN 05/22/19 0137 JDW    Culture (A)  Final    STAPHYLOCOCCUS AUREUS SUSCEPTIBILITIES PERFORMED ON PREVIOUS CULTURE WITHIN THE LAST 5 DAYS. Performed at [facility], [place] [address], [place], [place] [address]    Report Status 05/24/2019 FINAL  Final  Blood Culture ID Panel (Reflexed)     Status: Abnormal   Collection Time: 05/21/19 10:38 AM  Result Value Ref Range Status   Enterococcus species NOT DETECTED NOT DETECTED Final   Listeria monocytogenes NOT DETECTED NOT DETECTED Final   Staphylococcus species DETECTED (A) NOT DETECTED Final    Comment: CRITICAL RESULT CALLED TO, READ BACK BY AND VERIFIED WITH: S. COBLE RN, AT [phone] 05/22/19 BY D. VANHOOK    Staphylococcus aureus (BCID) DETECTED (A) NOT DETECTED Final    Comment: Methicillin (oxacillin)-resistant Staphylococcus aureus (MRSA). MRSA is predictably resistant to beta-lactam antibiotics (except ceftaroline). Preferred therapy is vancomycin unless clinically contraindicated. Patient requires contact precautions if  hospitalized. CRITICAL RESULT CALLED TO, READ BACK BY AND VERIFIED WITH: S. COBLE RN, AT [phone] 05/22/19 BY D. VANHOOK    Methicillin resistance DETECTED (A) NOT DETECTED Final    Comment: CRITICAL RESULT CALLED TO, READ BACK BY AND VERIFIED WITH: S. COBLE RN, AT [phone] 05/22/19 BY D. VANHOOK    Streptococcus species NOT DETECTED NOT DETECTED Final   Streptococcus agalactiae NOT DETECTED NOT DETECTED Final   Streptococcus pneumoniae NOT DETECTED NOT DETECTED Final   Streptococcus pyogenes NOT DETECTED NOT DETECTED Final   Acinetobacter baumannii NOT DETECTED NOT DETECTED Final   Enterobacteriaceae species NOT DETECTED NOT DETECTED Final   Enterobacter cloacae complex NOT DETECTED NOT DETECTED Final   Escherichia coli NOT DETECTED NOT DETECTED Final   Klebsiella oxytoca NOT DETECTED NOT DETECTED Final   Klebsiella pneumoniae  NOT DETECTED NOT DETECTED Final   Proteus species NOT DETECTED NOT DETECTED Final   Serratia marcescens NOT DETECTED NOT DETECTED Final   Haemophilus influenzae NOT DETECTED NOT DETECTED Final   Neisseria meningitidis NOT DETECTED NOT DETECTED Final   Pseudomonas aeruginosa NOT DETECTED NOT DETECTED Final   Candida albicans NOT DETECTED NOT DETECTED Final   Candida glabrata NOT DETECTED NOT DETECTED Final   Candida krusei NOT DETECTED NOT DETECTED Final   Candida parapsilosis NOT DETECTED NOT DETECTED Final   Candida tropicalis NOT DETECTED NOT DETECTED Final    Comment: Performed at [facility], [place]. [address], [place], [place] [address]  SARS CORONAVIRUS 2 (TAT 6-24 HRS) Nasopharyngeal Nasopharyngeal Swab     Status: None   Collection Time: 05/22/19  2:45 PM   Specimen: Nasopharyngeal Swab  Result Value Ref Range Status   SARS Coronavirus 2 NEGATIVE NEGATIVE Final    Comment: (NOTE) SARS-CoV-2 target nucleic acids are NOT DETECTED. The SARS-CoV-2 RNA is generally detectable in upper and lower respiratory specimens during the acute phase of infection. Negative results do not preclude SARS-CoV-2 infection, do not rule out co-infections with other pathogens, and should not be used as the sole basis for treatment or other patient management decisions. Negative results must be combined with clinical observations, patient history, and epidemiological information. The expected result is Negative. Fact Sheet for Patients: SugarRoll.be Fact Sheet for Healthcare Providers: https://www.woods-mathews.com/ This test is not yet approved or cleared by the [place] FDA and  has been authorized for detection and/or diagnosis of SARS-CoV-2 by FDA under an Emergency Use Authorization (EUA). This EUA will remain  in effect (meaning this test can be used) for the duration of the COVID-19 declaration under Section 56 4(b)(1) of the Act, 21  U.S.C. section 360bbb-3(b)(1), unless the authorization is terminated or revoked sooner. Performed at [facility], [place] [address], [place], [place] [address]       Radiology Studies: ECHOCARDIOGRAM COMPLETE  Result Date: 05/23/2019    ECHOCARDIOGRAM REPORT   Patient Name:   AIANNA FAHS Date of Exam: 05/23/2019 Medical Rec #:  [id number]       Height:       62.0 in Accession #:    [id number]      Weight:       120.4 lb Date of Birth:  1960/04/24        BSA:          1.541 m Patient Age:    27 years        BP:           128/61 mmHg Patient Gender: F               HR:           76 bpm. Exam Location:  Inpatient Procedure: 2D Echo Indications:    bacteremia 790.7  History:        Patient has prior history of Echocardiogram examinations, most                 recent 03/04/2019. Signs/Symptoms:Bacteremia; Risk                 Factors:Diabetes and Hypertension.  Sonographer:    Johny Chess Referring Phys: [id number] CHRISTOPHER P DANFORD IMPRESSIONS  1. Left ventricular ejection fraction, by estimation, is 55 to 60%. The left ventricle has normal function. The left ventricle has no regional wall motion abnormalities. Left ventricular diastolic parameters were normal.  2. Right ventricular systolic function is normal. The right ventricular size is normal.  3. The mitral valve is normal in structure. No evidence of mitral valve regurgitation. No evidence of mitral stenosis.  4. The aortic valve is normal in structure. Aortic valve regurgitation is not visualized. No aortic stenosis is present. FINDINGS  Left Ventricle: Left ventricular ejection fraction, by estimation, is 55 to 60%. The left ventricle has normal function. The left ventricle has no regional wall motion abnormalities. The left ventricular internal cavity size was normal in size. There is  no left ventricular hypertrophy. Left ventricular diastolic parameters were normal. Right Ventricle: The right ventricular size is normal. No increase  in right ventricular wall thickness. Right ventricular systolic function is normal. Left Atrium: Left atrial size was normal in size. Right Atrium: Right atrial size was normal in size. Pericardium: There is no evidence of pericardial effusion. Mitral Valve: The mitral valve is normal in structure. No evidence of mitral valve regurgitation. No evidence of mitral valve stenosis. Tricuspid Valve: The tricuspid valve is normal in structure. Tricuspid valve regurgitation is trivial. Aortic Valve: The aortic valve is normal in structure. Aortic valve regurgitation is not visualized. No aortic stenosis is present. Pulmonic Valve: The pulmonic valve was normal in structure. Pulmonic valve regurgitation is not visualized. Aorta: The aortic root and ascending aorta are structurally normal, with no evidence of dilitation. IAS/Shunts: The atrial septum is grossly normal.  LEFT VENTRICLE PLAX 2D LVIDd:         4.30 cm  Diastology LVIDs:         3.10 cm  LV e' lateral:   8.81 cm/s LV PW:         1.00 cm  LV E/e' lateral: 12.3 LV IVS:        0.80 cm  LV e' medial:    6.20 cm/s LVOT diam:     1.70 cm  LV E/e' medial:  17.4 LV SV:         57 LV SV Index:   37 LVOT Area:     2.27 cm  RIGHT VENTRICLE RV S prime:     12.00 cm/s TAPSE (M-mode): 1.8 cm LEFT ATRIUM             Index       RIGHT ATRIUM           Index LA diam:        2.80 cm 1.82 cm/m  RA Area:     10.80 cm LA Vol (A2C):   34.3 ml 22.26 ml/m RA Volume:   21.90 ml  14.21 ml/m LA Vol (A4C):   34.5 ml 22.39 ml/m LA Biplane Vol: 36.3 ml 23.56 ml/m  AORTIC VALVE LVOT Vmax:   139.00 cm/s LVOT Vmean:  90.400 cm/s LVOT VTI:    0.251 m  AORTA Ao Root diam: 2.60 cm MITRAL VALVE MV Area (PHT): 2.62 cm     SHUNTS MV Decel Time: 289 msec     Systemic VTI:  0.25 m MV E velocity: 108.00 cm/s  Systemic Diam: 1.70 cm MV A velocity: 102.00 cm/s MV E/A ratio:  1.06 Mertie Moores MD Electronically signed by Mertie Moores MD Signature Date/Time: 05/23/2019/11:26:04 AM    Final    DG  UGI W SMALL BOWEL  Result Date: 05/23/2019 CLINICAL DATA:  Bowel leak. EXAM: UPPER GI SERIES WITH KUB TECHNIQUE: After obtaining a scout radiograph a routine upper GI series was performed using water-soluble contrast only FLUOROSCOPY TIME:  Fluoroscopy Time:  4 minutes 36 seconds Radiation Exposure Index (if provided by the fluoroscopic device): 1.2 mGy Number of Acquired Spot Images: 4 COMPARISON:  CT study of May 21, 2019 and prior study from April 11, 2019 FINDINGS: Initial scout images show a surgical drain in the right upper quadrant and a pigtail drainage catheter near the midline. Study was performed only with water-soluble contrast. During the examination which was performed in the bilateral oblique and right lateral position the esophagus showed some evidence of tertiary peristalsis and a patulous appearance with moderate gastroesophageal reflux. Question of mild distal esophageal narrowing not well assessed. Stomach was grossly normal. Patient was placed in the right lateral position and contrast passed from the stomach into the small bowel. When contrast reached the level of the proximal jejunum a tract extended anteriorly towards the anterior abdominal wall and more distal small bowel loops began to fill, at least at 2 additional locations. Gas can be seen surrounding the drainage catheter. Paragraphs shortly after the tract extending from the jejunum is filled with contrast bowel loops to the right of the midline also began to fill and bowel loops inferior to the central abdominal collection. The at least 2 sites of communication or possible based on this study and based on review of previous imaging contrast did not fill the drain, likely passing via route of least resistance into the bowel beyond. 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 Difficult to map given the nature the process in communication with proximal small bowel where the first site of communication likely involves the proximal jejunum. Follow-up CT could be performed with positive enteric contrast. Alternatively, waiting for clearance of current contrast followed by a CT after drain injection could be helpful to further map this complex process as clinically warranted. Injection of the drain followed by CT would give the added advantage of three-dimensional relationships and knowledge of what is in definitive communication with the collection. Electronically Signed   By: Zetta Bills M.D.   On: 05/23/2019 11:40     LOS: 2 days   Time spent: More than 50% of that time was spent in counseling and/or coordination of care.  Antonieta Pert, MD [facility]  05/24/2019, 7:55 AM

## 2019-05-24 NOTE — Progress Notes (Signed)
On call [facility] MD made aware the right JP drain is completely out.O.K. to leave the drain out.

## 2019-05-25 LAB — GLUCOSE, CAPILLARY
Glucose-Capillary: 111 mg/dL — ABNORMAL HIGH (ref 70–99)
Glucose-Capillary: 111 mg/dL — ABNORMAL HIGH (ref 70–99)
Glucose-Capillary: 117 mg/dL — ABNORMAL HIGH (ref 70–99)
Glucose-Capillary: 126 mg/dL — ABNORMAL HIGH (ref 70–99)
Glucose-Capillary: 144 mg/dL — ABNORMAL HIGH (ref 70–99)
Glucose-Capillary: 156 mg/dL — ABNORMAL HIGH (ref 70–99)
Glucose-Capillary: 156 mg/dL — ABNORMAL HIGH (ref 70–99)

## 2019-05-25 LAB — IRON AND TIBC
Iron: 22 ug/dL — ABNORMAL LOW (ref 28–170)
Saturation Ratios: 17 % (ref 10.4–31.8)
TIBC: 126 ug/dL — ABNORMAL LOW (ref 250–450)
UIBC: 104 ug/dL

## 2019-05-25 LAB — COMPREHENSIVE METABOLIC PANEL
ALT: 26 U/L (ref 0–44)
AST: 21 U/L (ref 15–41)
Albumin: 1.5 g/dL — ABNORMAL LOW (ref 3.5–5.0)
Alkaline Phosphatase: 154 U/L — ABNORMAL HIGH (ref 38–126)
Anion gap: 8 (ref 5–15)
BUN: 24 mg/dL — ABNORMAL HIGH (ref 6–20)
CO2: 21 mmol/L — ABNORMAL LOW (ref 22–32)
Calcium: 8.1 mg/dL — ABNORMAL LOW (ref 8.9–10.3)
Chloride: 105 mmol/L (ref 98–111)
Creatinine, Ser: 0.84 mg/dL (ref 0.44–1.00)
GFR calc Af Amer: >60 mL/min (ref >60)
GFR calc non Af Amer: >60 mL/min (ref >60)
Glucose, Bld: 124 mg/dL — ABNORMAL HIGH (ref 70–99)
Potassium: 3.8 mmol/L (ref 3.5–5.1)
Sodium: 134 mmol/L — ABNORMAL LOW (ref 135–145)
Total Bilirubin: 1 mg/dL (ref 0.3–1.2)
Total Protein: 6.5 g/dL (ref 6.5–8.1)

## 2019-05-25 LAB — CBC
HCT: 22.4 % — ABNORMAL LOW (ref 36.0–46.0)
Hemoglobin: 7.1 g/dL — ABNORMAL LOW (ref 12.0–15.0)
MCH: 29.8 pg (ref 26.0–34.0)
MCHC: 31.7 g/dL (ref 30.0–36.0)
MCV: 94.1 fL (ref 80.0–100.0)
Platelets: 260 10*3/uL (ref 150–400)
RBC: 2.38 MIL/uL — ABNORMAL LOW (ref 3.87–5.11)
RDW: 14.6 % (ref 11.5–15.5)
WBC: 8.4 10*3/uL (ref 4.0–10.5)
nRBC: 0 % (ref 0.0–0.2)

## 2019-05-25 LAB — ZINC: Zinc: 44 ug/dL (ref 44–115)

## 2019-05-25 LAB — RETICULOCYTES
Immature Retic Fract: 24.7 % — ABNORMAL HIGH (ref 2.3–15.9)
RBC.: 2.35 MIL/uL — ABNORMAL LOW (ref 3.87–5.11)
Retic Count, Absolute: 35.5 10*3/uL (ref 19.0–186.0)
Retic Ct Pct: 1.5 % (ref 0.4–3.1)

## 2019-05-25 LAB — VITAMIN B12: Vitamin B-12: 750 pg/mL (ref 180–914)

## 2019-05-25 LAB — FERRITIN: Ferritin: 646 ng/mL — ABNORMAL HIGH (ref 11–307)

## 2019-05-25 LAB — FOLATE: Folate: 22.6 ng/mL (ref >5.9)

## 2019-05-25 LAB — OCCULT BLOOD X 1 CARD TO LAB, STOOL: Fecal Occult Bld: NEGATIVE

## 2019-05-25 MED ORDER — VANCOMYCIN HCL IN DEXTROSE 1-5 GM/200ML-% IV SOLN
1000.0000 mg | INTRAVENOUS | Status: DC
  Administered 2019-05-25 – 2019-05-30 (×6): 1000 mg via INTRAVENOUS
  Filled 2019-05-25 (×8): qty 200

## 2019-05-25 MED ORDER — DEXTROSE-NACL 5-0.9 % IV SOLN: INTRAVENOUS | Status: AC

## 2019-05-25 NOTE — Progress Notes (Signed)
Pharmacy Antibiotic Note  Gina [name] is a 59 y.o. female called back in for admission on 05/22/2019 with MRSA bacteremia. Pharmacy has been consulted for Vancomycin dosing.  Mild AKI resolving, SCr down to 0.84 (baseline appears to be between 0.4-0.5).   Plan: - Change vancomycin to 1000 mg IV q24h  - Calculated AUC 449.2 (goal 400-550) with SCr 0.84 - Will continue to follow renal function, culture results, LOT, and antibiotic de-escalation plans   Height: 5\' 2"  (157.5 cm) Weight: 120 lb 6.1 oz (54.6 kg) IBW/kg (Calculated) : 50.1  Temp (24hrs), Avg:98.4 F (36.9 C), Min:98.1 F (36.7 C), Max:98.5 F (36.9 C)  Recent Labs  Lab 05/21/19 1038 05/22/19 1600 05/22/19 1633 05/23/19 0534 05/23/19 1248 05/24/19 0522 05/24/19 0747 05/25/19 0632  WBC 12.5* 11.9*  --  9.7  --   --  8.3 8.4  CREATININE 0.77 0.88  --  1.11*  --  1.01*  --  0.84  LATICACIDVEN 1.5  --  1.2  --   --   --   --   --   VANCORANDOM  --   --   --   --  23  --   --   --     Estimated Creatinine Clearance: 57.7 mL/min (by C-G formula based on SCr of 0.84 mg/dL).    Allergies  Allergen Reactions  . Bee Venom Swelling    Swells up with bee stings   . Metformin And Related Other (See Comments)    Upset stomach    Antimicrobials this admission: Zosyn 3/10 x 1 Vanc 3/10 >>  Dose adjustments this admission: - 3/12 VR: 23 mcg/ml - est Vanc clearance 0.69 mcg/ml per hr, est level at 2200 ~16, will give 1g x 1, no standing doses  Microbiology results: 3/10 UCx >> PSA + E faecium - asymptomatic? F/u w/ ID 3/10 BCx >> 2/2 MRSA 3/11 COVID >> neg 3/12 BCx >> ngtd  Thank you for involving pharmacy in this patient's care.  Renold Genta, PharmD, BCPS Clinical Pharmacist Clinical phone for 05/25/2019 until 3p is [phone] 05/25/2019 10:04 AM  **Pharmacist phone directory can be found on [email or web address] listed under [place]**

## 2019-05-25 NOTE — Progress Notes (Signed)
PROGRESS NOTE    Beth Goodlin  VFI:[id number] DOB: Apr 26, 1960 DOA: 05/22/2019 PCP: [name], MD   Brief Narrative: 59 year old female with DM, HTN, schizophrenia, bilateral BKA sent from [facility] due to positive blood culture.  Recent admission November 2020 for septic shock with acute peritonitis status post exlap with [name] patch on 11/23 subsequent blood culture grew Candida glabrata peritoneal fluid grew Candida albicans frequent adrenal crystalloid current protein per collection showed Candida glabrata Enterococcus faecalis stenotrophomonas patient was discharged to select LTAC on Unasyn Bactrim and antifungal for total 6 weeks.  Patient was discharged from [facility] to [facility] whiere she has been living for last 3 months. Patient was seen in the ER for vague sleepiness malaise anorexia low-grade fever and leukocytosis at SNF, and in the ER exam labs unremarkable and patient was sent back to SNF but overnight blood culture returned MRSA 2/2 and she was readmitted to the hospital.  Subjective: Seen this morning, pleasant, alert awake, no complaints. No fever overnight with T-max 98.5.  Blood work with low hemoglobin 7.1 g, WBC stable. RLQ JP drain came out and CCS aware- advised to keep it out   Assessment & Plan:  MRSA bacteremia: indwelling PICC line used for TPN is felt likely source and PICC is out. Appreciate ID on board. Plan is to continue vancomycin repeat blood cultures 3/12-result pending, TTE negative for endocarditis. She has enterocutaneous fistula and drains  per Surgery.  Seen by speech distress passed MBS-and placed on diet 3/13 hopefully will not need TPN-PICC line moving forward. She has b/l stumps wound. Cont  wound care.  Plan as per infectious disease.  Intra-abdominal fluid collection with recent ex lap/partial small bowel resection and intra-abdominal abscess in November.General surgery following, right-sided drain came up 3/13 and surgery awake.  Continue  Left sided drain, enterocutaneous fistula management, further plan as per surgery. discCussed with the surgical team this morning.   HTN: BP well controlled , cont amlodipine.  Metabolic acidosis/elevated BUN/dehydration: As patient is on oral diet, oral slowly wean her off IV fluids once tolerating well blood sugar stabilizes. BUN decreasing 46-->24. Keep in ivf 30 ml/hr next 24 hrs  Diabetes mellitus: hbA1c 5.9 3/12.  With hypoglycemia blood sugar 67 3/12, discontinued Lantus and  Placed on D5 NS.As po intake picks up we will stop the IV fluids.    Recent Labs  Lab 05/24/19 2006 05/25/19 0010 05/25/19 0501 05/25/19 0512 05/25/19 0725  GLUCAP 118* 111* 117* 111* 126*   Severe protein-calorie malnutrition: TPN dependent, dietitian on consult.  Now on oral diet continue supplementation, off TPN and PICC line.  Augment nutrition as tolerated.   Anemia of chronic disease: Previously hemoglobin has been ranging from 9 to 8 g, and drifting downward.  Suspect multifactorial with chronic disease, no obvious bleeding present. HB low at 7.0., anemia panel ferritin 646, iron low at 22, B12 750, folate 22. Ordered fecal occult blood,monitor hemoglobin, transfuse for less than 7 g or if symptomatic when she is more ambulatory.  Patient reports she has had received blood transfusion in the past.  Below-knee amputee/Atherosclerosis of native arteries: Stump evaluation by wound care for her ilateral stump wounds   Transaminitis: Suspect from TPN.  Monitor closely.  Asymptomatic bacteriuria: replaceD Foley after admission.  GERD: on PPI  Mood disorder:on Celexa  Goals of care: Palliative care consulted patient is DNR MOST form filled.  Per recommendation to treat what is treatable and plan on palliative care referral upon discharge. Given significant  comorbidities malnutrition high risk for readmission and decompensation.  Interventions: Refer to RD note for recommendations Body mass index is 22.02  kg/m.  Pressure Ulcer: Pressure Injury 05/22/19 Coccyx Bilateral Stage 2 -  Partial thickness loss of dermis presenting as a shallow open injury with a red, pink wound bed without slough. (Active)  05/22/19 2104  Location: Coccyx  Location Orientation: Bilateral  Staging: Stage 2 -  Partial thickness loss of dermis presenting as a shallow open injury with a red, pink wound bed without slough.  Wound Description (Comments):   Present on Admission: Yes     Pressure Injury 05/22/19 Knee Right;Distal Unstageable - Full thickness tissue loss in which the base of the injury is covered by slough (yellow, tan, gray, green or brown) and/or eschar (tan, brown or black) in the wound bed. Stump (Active)  05/22/19 2106  Location: Knee  Location Orientation: Right;Distal  Staging: Unstageable - Full thickness tissue loss in which the base of the injury is covered by slough (yellow, tan, gray, green or brown) and/or eschar (tan, brown or black) in the wound bed.  Wound Description (Comments): Stump  Present on Admission: Yes     Pressure Injury 05/22/19 Knee Left;Distal Unstageable - Full thickness tissue loss in which the base of the injury is covered by slough (yellow, tan, gray, green or brown) and/or eschar (tan, brown or black) in the wound bed. Stump (Active)  05/22/19 2108  Location: Knee  Location Orientation: Left;Distal  Staging: Unstageable - Full thickness tissue loss in which the base of the injury is covered by slough (yellow, tan, gray, green or brown) and/or eschar (tan, brown or black) in the wound bed.  Wound Description (Comments): Stump  Present on Admission: Yes     Pressure Injury 05/22/19 Thigh Left;Mid Stage 2 -  Partial thickness loss of dermis presenting as a shallow open injury with a red, pink wound bed without slough. (Active)  05/22/19 2109  Location: Thigh  Location Orientation: Left;Mid  Staging: Stage 2 -  Partial thickness loss of dermis presenting as a shallow open  injury with a red, pink wound bed without slough.  Wound Description (Comments):   Present on Admission: Yes   DVT prophylaxis:lovenox Code Status: DNR Family Communication: plan of care discussed with patient at bedside.  Reports her daughter is aware of the plan. Disposition Plan: Patient is from: SNF Anticipated Disposition:? to home with home and services.  Patient reports she is not going back to skilled nursing facility.   Barriers to discharge or conditions that needs to be met prior to discharge: Patient remains hospitalized for MRSA bacteremia treatment and further work-up and source control and enteral nutrition assessment, a speech evaluation for diet.  Consultants: Infectious disease, general surgery Procedures:  2D ECHO 1. Left ventricular ejection fraction, by estimation, is 55 to 60%. The  left ventricle has normal function. The left ventricle has no regional  wall motion abnormalities. Left ventricular diastolic parameters were  normal.  2. Right ventricular systolic function is normal. The right ventricular  size is normal.  3. The mitral valve is normal in structure. No evidence of mitral valve  regurgitation. No evidence of mitral stenosis.  4. The aortic valve is normal in structure. Aortic valve regurgitation is  not visualized. No aortic stenosis is present.   Microbiology: Blood culture with MRSA 05/21/19  Medications: Scheduled Meds: . amLODipine  5 mg Oral Daily  . chlorhexidine  15 mL Mouth Rinse BID  . Chlorhexidine Gluconate  Cloth  6 each Topical Daily  . enoxaparin (LOVENOX) injection  40 mg Subcutaneous Q24H  . escitalopram  10 mg Oral Daily  . feeding supplement (ENSURE ENLIVE)  237 mL Oral TID BM  . feeding supplement (PRO-STAT SUGAR FREE 64)  30 mL Oral BID WC  . gabapentin  300 mg Oral BID  . insulin aspart  0-9 Units Subcutaneous Q6H  . mouth rinse  15 mL Mouth Rinse q12n4p  . Melatonin  3 mg Oral QHS  . metoprolol tartrate  50 mg Oral  BID  . nystatin  5 mL Oral QID  . pantoprazole  40 mg Oral Daily  . simethicone  80 mg Oral QID   Continuous Infusions: . dextrose 5 % and 0.9% NaCl 50 mL/hr at 05/24/19 1546  . vancomycin 750 mg (05/24/19 2314)    Antimicrobials: Anti-infectives (From admission, onward)   Start     Dose/Rate Route Frequency Ordered Stop   05/24/19 2200  vancomycin (VANCOREADY) IVPB 750 mg/150 mL     750 mg 150 mL/hr over 60 Minutes Intravenous Every 24 hours 05/24/19 1156     05/23/19 2200  vancomycin (VANCOCIN) IVPB 1000 mg/200 mL premix     1,000 mg 200 mL/hr over 60 Minutes Intravenous  Once 05/23/19 1345 05/24/19 0008   05/23/19 1113  vancomycin variable dose per unstable renal function (pharmacist dosing)  Status:  Discontinued      Does not apply See admin instructions 05/23/19 1114 05/24/19 1156   05/22/19 1500  vancomycin (VANCOREADY) IVPB 1250 mg/250 mL  Status:  Discontinued     1,250 mg 166.7 mL/hr over 90 Minutes Intravenous Every 24 hours 05/22/19 1429 05/23/19 1114       Objective: Vitals: Today's Vitals   05/24/19 2007 05/25/19 0007 05/25/19 0107 05/25/19 0511  BP: 123/63   (!) 147/75  Pulse: 73   75  Resp: 16   15  Temp: 98.4 F (36.9 C)   98.4 F (36.9 C)  TempSrc:      SpO2: 95%   95%  Weight: 54.6 kg     Height:      PainSc:  7  Asleep     Intake/Output Summary (Last 24 hours) at 05/25/2019 0759 Last data filed at 05/25/2019 0511 Gross per 24 hour  Intake 1863.55 ml  Output 870 ml  Net 993.55 ml   Filed Weights   05/22/19 2100 05/24/19 2007  Weight: 54.6 kg 54.6 kg   Weight change:    Intake/Output from previous day: 03/13 0701 - 03/14 0700 In: 1863.6 [P.O.:960; I.V.:903.6] Out: 870 [Urine:650; Stool:220] Intake/Output this shift: No intake/output data recorded.  Examination:  General exam: AAO X3, ill frail and cachectic.   HEENT:Oral mucosa moist, Ear/Nose WNL grossly,dentition normal. Respiratory system: bilaterally clear,no use of accessory  muscle, non tender. Cardiovascular system: S1 & S2 +, regular, No JVD. Gastrointestinal system: Abdomen soft, right lower quadrant drain is slightly pulled out mild greenish drainage +, enterocutaneous fistula present with costomy bag-with yellowish liquid.Foley catheter present.  Nervous System:Alert, awake, moving extremities and grossly nonfocal Extremities: No edema, distal peripheral pulses palpable.  Skin: No rashes,no icterus. MSK: BKA bilaterally with a stump with wounds.    Data Reviewed: I have personally reviewed following labs and imaging studies CBC: Recent Labs  Lab 05/21/19 1038 05/22/19 1600 05/23/19 0534 05/24/19 0747 05/25/19 0632  WBC 12.5* 11.9* 9.7 8.3 8.4  NEUTROABS 9.5*  --   --   --   --   HGB  8.2* 7.6* 7.1* 7.0* 7.1*  HCT 26.0* 24.1* 22.1* 22.1* 22.4*  MCV 97.4 94.5 94.0 94.4 94.1  PLT 172 186 171 226 174   Basic Metabolic Panel: Recent Labs  Lab 05/21/19 1038 05/22/19 1600 05/23/19 0534 05/24/19 0522  NA 134* 135 137 135  K 4.0 4.3 3.9 4.7  CL 104 101 102 106  CO2 22 25 23  19*  GLUCOSE 158* 243* 109* 90  BUN 37* 46* 45* 38*  CREATININE 0.77 0.88 1.11* 1.01*  CALCIUM 8.7* 8.6* 8.7* 8.0*   GFR: Estimated Creatinine Clearance: 48 mL/min (A) (by C-G formula based on SCr of 1.01 mg/dL (H)). Liver Function Tests: Recent Labs  Lab 05/21/19 1038 05/22/19 1600 05/23/19 0534 05/24/19 0522  AST 55* 42* 36 43*  ALT 46* 56* 46* 35  ALKPHOS 179* 181* 169* 150*  BILITOT 0.7 1.3* 1.5* 1.8*  PROT 6.7 7.0 6.8 5.9*  ALBUMIN 1.6* 1.7* 1.6* 1.5*   No results for input(s): LIPASE, AMYLASE in the last 168 hours. No results for input(s): AMMONIA in the last 168 hours. Coagulation Profile: No results for input(s): INR, PROTIME in the last 168 hours. Cardiac Enzymes: No results for input(s): CKTOTAL, CKMB, CKMBINDEX, TROPONINI in the last 168 hours. BNP (last 3 results) No results for input(s): PROBNP in the last 8760 hours. HbA1C: Recent Labs     05/23/19 0534  HGBA1C 5.9*   CBG: Recent Labs  Lab 05/24/19 2006 05/25/19 0010 05/25/19 0501 05/25/19 0512 05/25/19 0725  GLUCAP 118* 111* 117* 111* 126*   Lipid Profile: No results for input(s): CHOL, HDL, LDLCALC, TRIG, CHOLHDL, LDLDIRECT in the last 72 hours. Thyroid Function Tests: No results for input(s): TSH, T4TOTAL, FREET4, T3FREE, THYROIDAB in the last 72 hours. Anemia Panel: Recent Labs    05/25/19 0814  RETICCTPCT 1.5   Sepsis Labs: Recent Labs  Lab 05/21/19 1038 05/22/19 1633  LATICACIDVEN 1.5 1.2    Recent Results (from the past 240 hour(s))  Urine Culture     Status: Abnormal   Collection Time: 05/21/19 10:00 AM   Specimen: Urine, Catheterized  Result Value Ref Range Status   Specimen Description URINE, CATHETERIZED  Final   Special Requests   Final    NONE Performed at [facility], [place] [address] [place], [place] [address]    Culture (A)  Final    >=100,000 COLONIES/mL PSEUDOMONAS AERUGINOSA 50,000 COLONIES/mL VANCOMYCIN RESISTANT ENTEROCOCCUS ISOLATED    Report Status 05/24/2019 FINAL  Final   Organism ID, Bacteria PSEUDOMONAS AERUGINOSA (A)  Final   Organism ID, Bacteria VANCOMYCIN RESISTANT ENTEROCOCCUS ISOLATED (A)  Final      Susceptibility   Pseudomonas aeruginosa - MIC*    CEFTAZIDIME 4 SENSITIVE Sensitive     CIPROFLOXACIN <=0.25 SENSITIVE Sensitive     GENTAMICIN <=1 SENSITIVE Sensitive     IMIPENEM 1 SENSITIVE Sensitive     PIP/TAZO <=4 SENSITIVE Sensitive     CEFEPIME 2 SENSITIVE Sensitive     * >=100,000 COLONIES/mL PSEUDOMONAS AERUGINOSA   Vancomycin resistant enterococcus isolated - MIC*    AMPICILLIN >=32 RESISTANT Resistant     NITROFURANTOIN 256 RESISTANT Resistant     VANCOMYCIN >=32 RESISTANT Resistant     LINEZOLID 2 SENSITIVE Sensitive     * 50,000 COLONIES/mL VANCOMYCIN RESISTANT ENTEROCOCCUS ISOLATED  Respiratory Panel by RT PCR (Flu A&B, Covid) - Nasopharyngeal Swab     Status: None   Collection Time:  05/21/19 10:03 AM   Specimen: Nasopharyngeal Swab  Result Value Ref Range  Status   SARS Coronavirus 2 by RT PCR NEGATIVE NEGATIVE Final    Comment: (NOTE) SARS-CoV-2 target nucleic acids are NOT DETECTED. The SARS-CoV-2 RNA is generally detectable in upper respiratoy specimens during the acute phase of infection. The lowest concentration of SARS-CoV-2 viral copies this assay can detect is 131 copies/mL. A negative result does not preclude SARS-Cov-2 infection and should not be used as the sole basis for treatment or other patient management decisions. A negative result may occur with  improper specimen collection/handling, submission of specimen other than nasopharyngeal swab, presence of viral mutation(s) within the areas targeted by this assay, and inadequate number of viral copies (<131 copies/mL). A negative result must be combined with clinical observations, patient history, and epidemiological information. The expected result is Negative. Fact Sheet for Patients:  PinkCheek.be Fact Sheet for Healthcare Providers:  GravelBags.it This test is not yet ap proved or cleared by the [place] FDA and  has been authorized for detection and/or diagnosis of SARS-CoV-2 by FDA under an Emergency Use Authorization (EUA). This EUA will remain  in effect (meaning this test can be used) for the duration of the COVID-19 declaration under Section 564(b)(1) of the Act, 21 U.S.C. section 360bbb-3(b)(1), unless the authorization is terminated or revoked sooner.    Influenza A by PCR NEGATIVE NEGATIVE Final   Influenza B by PCR NEGATIVE NEGATIVE Final    Comment: (NOTE) The Xpert Xpress SARS-CoV-2/FLU/RSV assay is intended as an aid in  the diagnosis of influenza from Nasopharyngeal swab specimens and  should not be used as a sole basis for treatment. Nasal washings and  aspirates are unacceptable for Xpert Xpress SARS-CoV-2/FLU/RSV    testing. Fact Sheet for Patients: PinkCheek.be Fact Sheet for Healthcare Providers: GravelBags.it This test is not yet approved or cleared by the [place] FDA and  has been authorized for detection and/or diagnosis of SARS-CoV-2 by  FDA under an Emergency Use Authorization (EUA). This EUA will remain  in effect (meaning this test can be used) for the duration of the  Covid-19 declaration under Section 564(b)(1) of the Act, 21  U.S.C. section 360bbb-3(b)(1), unless the authorization is  terminated or revoked. Performed at [facility], [place] [address], [place], [place] [address]   Blood culture (routine x 2)     Status: Abnormal   Collection Time: 05/21/19 10:38 AM   Specimen: BLOOD RIGHT ARM  Result Value Ref Range Status   Specimen Description BLOOD RIGHT ARM  Final   Special Requests   Final    AEROBIC BOTTLE ONLY Blood Culture results may not be optimal due to an inadequate volume of blood received in culture bottles   Culture  Setup Time   Final    GRAM POSITIVE COCCI IN CLUSTERS AEROBIC BOTTLE ONLY CRITICAL RESULT CALLED TO, READ BACK BY AND VERIFIED WITH: Macky Lower RN, AT [phone] 05/22/19 BY Rush Landmark Performed at [facility], [place] [address], [place], [place] [address]    Culture METHICILLIN RESISTANT STAPHYLOCOCCUS AUREUS (A)  Final   Report Status 05/24/2019 FINAL  Final   Organism ID, Bacteria METHICILLIN RESISTANT STAPHYLOCOCCUS AUREUS  Final      Susceptibility   Methicillin resistant staphylococcus aureus - MIC*    CIPROFLOXACIN >=8 RESISTANT Resistant     ERYTHROMYCIN <=0.25 SENSITIVE Sensitive     GENTAMICIN <=0.5 SENSITIVE Sensitive     OXACILLIN >=4 RESISTANT Resistant     TETRACYCLINE <=1 SENSITIVE Sensitive     VANCOMYCIN 1 SENSITIVE Sensitive  TRIMETH/SULFA <=10 SENSITIVE Sensitive     CLINDAMYCIN <=0.25 SENSITIVE Sensitive     RIFAMPIN <=0.5 SENSITIVE Sensitive     Inducible  Clindamycin NEGATIVE Sensitive     * METHICILLIN RESISTANT STAPHYLOCOCCUS AUREUS  Blood culture (routine x 2)     Status: Abnormal   Collection Time: 05/21/19 10:38 AM   Specimen: BLOOD RIGHT HAND  Result Value Ref Range Status   Specimen Description BLOOD RIGHT HAND  Final   Special Requests   Final    AEROBIC BOTTLE ONLY Blood Culture results may not be optimal due to an inadequate volume of blood received in culture bottles   Culture  Setup Time   Final    AEROBIC BOTTLE ONLY GRAM POSITIVE COCCI IN CLUSTERS CRITICAL RESULT CALLED TO, READ BACK BY AND VERIFIED WITH: K NEAL RN 05/22/19 0137 JDW    Culture (A)  Final    STAPHYLOCOCCUS AUREUS SUSCEPTIBILITIES PERFORMED ON PREVIOUS CULTURE WITHIN THE LAST 5 DAYS. Performed at [facility], [place] [address], [place], [place] [address]    Report Status 05/24/2019 FINAL  Final  Blood Culture ID Panel (Reflexed)     Status: Abnormal   Collection Time: 05/21/19 10:38 AM  Result Value Ref Range Status   Enterococcus species NOT DETECTED NOT DETECTED Final   Listeria monocytogenes NOT DETECTED NOT DETECTED Final   Staphylococcus species DETECTED (A) NOT DETECTED Final    Comment: CRITICAL RESULT CALLED TO, READ BACK BY AND VERIFIED WITH: S. COBLE RN, AT [phone] 05/22/19 BY D. VANHOOK    Staphylococcus aureus (BCID) DETECTED (A) NOT DETECTED Final    Comment: Methicillin (oxacillin)-resistant Staphylococcus aureus (MRSA). MRSA is predictably resistant to beta-lactam antibiotics (except ceftaroline). Preferred therapy is vancomycin unless clinically contraindicated. Patient requires contact precautions if  hospitalized. CRITICAL RESULT CALLED TO, READ BACK BY AND VERIFIED WITH: S. COBLE RN, AT [phone] 05/22/19 BY D. VANHOOK    Methicillin resistance DETECTED (A) NOT DETECTED Final    Comment: CRITICAL RESULT CALLED TO, READ BACK BY AND VERIFIED WITH: S. COBLE RN, AT [phone] 05/22/19 BY D. VANHOOK    Streptococcus species NOT DETECTED NOT DETECTED  Final   Streptococcus agalactiae NOT DETECTED NOT DETECTED Final   Streptococcus pneumoniae NOT DETECTED NOT DETECTED Final   Streptococcus pyogenes NOT DETECTED NOT DETECTED Final   Acinetobacter baumannii NOT DETECTED NOT DETECTED Final   Enterobacteriaceae species NOT DETECTED NOT DETECTED Final   Enterobacter cloacae complex NOT DETECTED NOT DETECTED Final   Escherichia coli NOT DETECTED NOT DETECTED Final   Klebsiella oxytoca NOT DETECTED NOT DETECTED Final   Klebsiella pneumoniae NOT DETECTED NOT DETECTED Final   Proteus species NOT DETECTED NOT DETECTED Final   Serratia marcescens NOT DETECTED NOT DETECTED Final   Haemophilus influenzae NOT DETECTED NOT DETECTED Final   Neisseria meningitidis NOT DETECTED NOT DETECTED Final   Pseudomonas aeruginosa NOT DETECTED NOT DETECTED Final   Candida albicans NOT DETECTED NOT DETECTED Final   Candida glabrata NOT DETECTED NOT DETECTED Final   Candida krusei NOT DETECTED NOT DETECTED Final   Candida parapsilosis NOT DETECTED NOT DETECTED Final   Candida tropicalis NOT DETECTED NOT DETECTED Final    Comment: Performed at [facility], [place]. [address], [place], [place] [address]  SARS CORONAVIRUS 2 (TAT 6-24 HRS) Nasopharyngeal Nasopharyngeal Swab     Status: None   Collection Time: 05/22/19  2:45 PM   Specimen: Nasopharyngeal Swab  Result Value Ref Range Status   SARS Coronavirus 2 NEGATIVE  NEGATIVE Final    Comment: (NOTE) SARS-CoV-2 target nucleic acids are NOT DETECTED. The SARS-CoV-2 RNA is generally detectable in upper and lower respiratory specimens during the acute phase of infection. Negative results do not preclude SARS-CoV-2 infection, do not rule out co-infections with other pathogens, and should not be used as the sole basis for treatment or other patient management decisions. Negative results must be combined with clinical observations, patient history, and epidemiological information. The expected result is  Negative. Fact Sheet for Patients: SugarRoll.be Fact Sheet for Healthcare Providers: https://www.woods-mathews.com/ This test is not yet approved or cleared by the [place] FDA and  has been authorized for detection and/or diagnosis of SARS-CoV-2 by FDA under an Emergency Use Authorization (EUA). This EUA will remain  in effect (meaning this test can be used) for the duration of the COVID-19 declaration under Section 56 4(b)(1) of the Act, 21 U.S.C. section 360bbb-3(b)(1), unless the authorization is terminated or revoked sooner. Performed at [facility], [place] [address] [place], [place] [address]   Culture, blood (routine x 2)     Status: None (Preliminary result)   Collection Time: 05/23/19  5:34 AM   Specimen: BLOOD  Result Value Ref Range Status   Specimen Description BLOOD LEFT ARM  Final   Special Requests   Final    BOTTLES DRAWN AEROBIC AND ANAEROBIC Blood Culture results may not be optimal due to an excessive volume of blood received in culture bottles   Culture   Final    NO GROWTH 1 DAY Performed at [facility], [place] [address], [place], [place] [address]    Report Status PENDING  Incomplete  Culture, blood (routine x 2)     Status: None (Preliminary result)   Collection Time: 05/23/19  5:42 AM   Specimen: BLOOD  Result Value Ref Range Status   Specimen Description BLOOD LEFT HAND  Final   Special Requests   Final    BOTTLES DRAWN AEROBIC AND ANAEROBIC Blood Culture adequate volume   Culture   Final    NO GROWTH 1 DAY Performed at [facility], [place] [address], [place], [place] [address]    Report Status PENDING  Incomplete      Radiology Studies: DG Swallowing Func-Speech Pathology  Result Date: 05/24/2019 Objective Swallowing Evaluation: Type of Study: MBS-Modified Barium Swallow Study  Patient Details Name: Abbigael Detlefsen MRN: [id number] Date of Birth: 12/05/1960 Today's Date: 05/24/2019 Time: SLP  Start Time (ACUTE ONLY): 1035 -SLP Stop Time (ACUTE ONLY): 1043 SLP Time Calculation (min) (ACUTE ONLY): 8 min Past Medical History: Past Medical History: Diagnosis Date . Diabetes mellitus  . Hypertension  . Osteomyelitis of right foot ([place]) 02/22/2017 . Perforated gastric ulcer ([place])  . S/P bilateral BKA (below knee amputation) ([place])  . Schizophrenia ([place])  . Schizophrenia ([place])  . Septic arthritis of interphalangeal joint of toe of left foot ([place]) 06/02/2016 . Stress incontinence 04/26/2017 Past Surgical History: Past Surgical History: Procedure Laterality Date . AMPUTATION Left 06/05/2016  Procedure: LEFT FOOT TRANSMETATARSAL AMPUTATION;  Surgeon: [name], MD;  Location: [facility];  Service: Orthopedics;  Laterality: Left; . AMPUTATION Right 11/11/2017  Procedure: AMPUTATION BELOW KNEE;  Surgeon: [name], MD;  Location: [facility];  Service: Orthopedics;  Laterality: Right; . AMPUTATION Left 05/10/2018  Procedure: LEFT BELOW KNEE AMPUTATION;  Surgeon: [name], MD;  Location: [facility];  Service: Orthopedics;  Laterality: Left; . AMPUTATION Left 12/28/2018  Procedure: AMPUTATION THIRD DIGIT;  Surgeon: Iran Planas, MD;  Location: [facility];  Service: Orthopedics;  Laterality: Left; . AMPUTATION TOE Right 02/23/2017  Procedure: AMPUTATION RIGHT THIRD TOE;  Surgeon: [name], MD;  Location: [place];  Service: Orthopedics;  Laterality: Right; . I & D EXTREMITY Right 11/09/2017  Procedure: Debride Ulcer Right Heel;  Surgeon: [name], MD;  Location: [place];  Service: Orthopedics;  Laterality: Right; . I & D EXTREMITY Left 12/28/2018  Procedure: IRRIGATION AND DEBRIDEMENT EXTREMITY;  Surgeon: Iran Planas, MD;  Location: [facility];  Service: Orthopedics;  Laterality: Left; . INCISION AND DRAINAGE OF WOUND Left 12/26/2018  Procedure: IRRIGATION AND DEBRIDEMENT LEFT HAND;  Surgeon: Iran Planas, MD;  Location: [facility];  Service: Orthopedics;  Laterality: Left; . LAPAROTOMY N/A 02/02/2019  Procedure: EXPLORATORY  LAPAROTOMY WITH OVERSEW OF DUODENAL ULCER;  Surgeon: Alphonsa Overall, MD;  Location: [facility];  Service: General;  Laterality: N/A; . TUBAL LIGATION   HPI: Etoile Looman is a 59 y.o. F with hx DM, HTN, bilateral BKA, schizophrenia, and recent perforated ulcer leading to septic shock, DKA, fungemia, and persistent peritoneal abscess who presents with malaise, found to have MRSA bacteremia. Patient admitted in Nov 2020 for septic shock, found to have acute peritonitis. [facility], and anidulafungin for total 6 weeks. Based on chart review, patient was seen for dysphagia and participated in [facility] 03/2019 while at [facility] However, full notes not available. She was then discharged from [facility] to [facility], where she has been for last three months.  Pt now admitted with MRSA. CXR 3/10: Bibasilar atelectasis or infiltrates, right greater than left. Small right pleural effusion.  No data recorded Assessment / Plan / Recommendation [facility] IP CLINICAL IMPRESSIONS 05/24/2019 Clinical Impression Patient presensts with a largely normal, age appropriate, oropharyngeal swallow. Oral phase timely with swift oral transit of bolus. Swallow triggered mostly at the vallecula with one episode of frank penetration of thin liquids which remained under the epiglottis post swallow however cleared spontaneously with subsequent swallows before reaching the level of the vocal cords. No additional episodes of penetration or aspiration despite being challenged with multiple consecutive straw sips of liquid. Note that patient unable to orally  transit pill with thin liquids, able with pureed solids, however no trouble reported at bedside with pills by patient or RN. Recommend initiation of regular texture solids, thin liquid (per MD, "diet as tolerated"). SLP will f/u briefly for tolerance.  SLP Visit Diagnosis Dysphagia, unspecified (R13.10) Attention and concentration deficit following -- Frontal  lobe and executive function deficit following -- Impact on safety and function No limitations   CH[facility] IP TREATMENT RECOMMENDATION 05/24/2019 Treatment Recommendations Therapy as outlined in treatment plan below   Prognosis 05/23/2019 Prognosis for Safe Diet Advancement Fair Barriers to Reach Goals Severity of deficits;Time post onset Barriers/Prognosis Comment -- CH[facility] IP DIET RECOMMENDATION 05/24/2019 SLP Diet Recommendations Regular solids;Thin liquid Liquid Administration via Cup;Straw Medication Administration Whole meds with liquid Compensations Slow rate;Small sips/bites Postural Changes Seated upright at 90 degrees   CHL IP OTHER RECOMMENDATIONS 05/24/2019 Recommended Consults -- Oral Care Recommendations Oral care BID Other Recommendations --   CHL IP FOLLOW UP RECOMMENDATIONS 05/24/2019 Follow up Recommendations None   CH[facility] IP FREQUENCY AND DURATION 05/24/2019 Speech Therapy Frequency (ACUTE ONLY) min 1 x/week Treatment Duration 1 week      CHL IP ORAL PHASE 05/24/2019 Oral Phase WFL Oral - Pudding Teaspoon -- Oral - Pudding Cup -- Oral - Honey Teaspoon --  Oral - Honey Cup -- Oral - Nectar Teaspoon -- Oral - Nectar Cup -- Oral - Nectar Straw -- Oral - Thin Teaspoon -- Oral - Thin Cup -- Oral - Thin Straw -- Oral - Puree -- Oral - Mech Soft -- Oral - Regular -- Oral - Multi-Consistency -- Oral - Pill -- Oral Phase - Comment --  CHL IP PHARYNGEAL PHASE 05/24/2019 Pharyngeal Phase WFL Pharyngeal- Pudding Teaspoon -- Pharyngeal -- Pharyngeal- Pudding Cup -- Pharyngeal -- Pharyngeal- Honey Teaspoon -- Pharyngeal -- Pharyngeal- Honey Cup -- Pharyngeal -- Pharyngeal- Nectar Teaspoon -- Pharyngeal -- Pharyngeal- Nectar Cup -- Pharyngeal -- Pharyngeal- Nectar Straw -- Pharyngeal -- Pharyngeal- Thin Teaspoon -- Pharyngeal -- Pharyngeal- Thin Cup -- Pharyngeal -- Pharyngeal- Thin Straw -- Pharyngeal -- Pharyngeal- Puree -- Pharyngeal -- Pharyngeal- Mechanical Soft -- Pharyngeal -- Pharyngeal- Regular -- Pharyngeal -- Pharyngeal-  Multi-consistency -- Pharyngeal -- Pharyngeal- Pill -- Pharyngeal -- Pharyngeal Comment --  CHL IP CERVICAL ESOPHAGEAL PHASE 05/24/2019 Cervical Esophageal Phase WFL Pudding Teaspoon -- Pudding Cup -- Honey Teaspoon -- Honey Cup -- Nectar Teaspoon -- Nectar Cup -- Nectar Straw -- Thin Teaspoon -- Thin Cup -- Thin Straw -- Puree -- Mechanical Soft -- Regular -- Multi-consistency -- Pill -- Cervical Esophageal Comment -- Gabriel Rainwater MA, CCC-SLP McCoy Leah Meryl 05/24/2019, 11:11 AM              ECHOCARDIOGRAM COMPLETE  Result Date: 05/23/2019    ECHOCARDIOGRAM REPORT   Patient Name:   DEAJA RIZO Date of Exam: 05/23/2019 Medical Rec #:  [id number]       Height:       62.0 in Accession #:    [id number]      Weight:       120.4 lb Date of Birth:  08-18-1960        BSA:          1.541 m Patient Age:    76 years        BP:           128/61 mmHg Patient Gender: F               HR:           76 bpm. Exam Location:  Inpatient Procedure: 2D Echo Indications:    bacteremia 790.7  History:        Patient has prior history of Echocardiogram examinations, most                 recent 03/04/2019. Signs/Symptoms:Bacteremia; Risk                 Factors:Diabetes and Hypertension.  Sonographer:    Johny Chess Referring Phys: [id number] CHRISTOPHER P DANFORD IMPRESSIONS  1. Left ventricular ejection fraction, by estimation, is 55 to 60%. The left ventricle has normal function. The left ventricle has no regional wall motion abnormalities. Left ventricular diastolic parameters were normal.  2. Right ventricular systolic function is normal. The right ventricular size is normal.  3. The mitral valve is normal in structure. No evidence of mitral valve regurgitation. No evidence of mitral stenosis.  4. The aortic valve is normal in structure. Aortic valve regurgitation is not visualized. No aortic stenosis is present. FINDINGS  Left Ventricle: Left ventricular ejection fraction, by estimation, is 55 to 60%. The left ventricle has normal  function. The left ventricle has no regional wall motion abnormalities. The left ventricular internal cavity size was normal in size. There is  no left ventricular hypertrophy.  Left ventricular diastolic parameters were normal. Right Ventricle: The right ventricular size is normal. No increase in right ventricular wall thickness. Right ventricular systolic function is normal. Left Atrium: Left atrial size was normal in size. Right Atrium: Right atrial size was normal in size. Pericardium: There is no evidence of pericardial effusion. Mitral Valve: The mitral valve is normal in structure. No evidence of mitral valve regurgitation. No evidence of mitral valve stenosis. Tricuspid Valve: The tricuspid valve is normal in structure. Tricuspid valve regurgitation is trivial. Aortic Valve: The aortic valve is normal in structure. Aortic valve regurgitation is not visualized. No aortic stenosis is present. Pulmonic Valve: The pulmonic valve was normal in structure. Pulmonic valve regurgitation is not visualized. Aorta: The aortic root and ascending aorta are structurally normal, with no evidence of dilitation. IAS/Shunts: The atrial septum is grossly normal.  LEFT VENTRICLE PLAX 2D LVIDd:         4.30 cm  Diastology LVIDs:         3.10 cm  LV e' lateral:   8.81 cm/s LV PW:         1.00 cm  LV E/e' lateral: 12.3 LV IVS:        0.80 cm  LV e' medial:    6.20 cm/s LVOT diam:     1.70 cm  LV E/e' medial:  17.4 LV SV:         57 LV SV Index:   37 LVOT Area:     2.27 cm  RIGHT VENTRICLE RV S prime:     12.00 cm/s TAPSE (M-mode): 1.8 cm LEFT ATRIUM             Index       RIGHT ATRIUM           Index LA diam:        2.80 cm 1.82 cm/m  RA Area:     10.80 cm LA Vol (A2C):   34.3 ml 22.26 ml/m RA Volume:   21.90 ml  14.21 ml/m LA Vol (A4C):   34.5 ml 22.39 ml/m LA Biplane Vol: 36.3 ml 23.56 ml/m  AORTIC VALVE LVOT Vmax:   139.00 cm/s LVOT Vmean:  90.400 cm/s LVOT VTI:    0.251 m  AORTA Ao Root diam: 2.60 cm MITRAL VALVE MV Area  (PHT): 2.62 cm     SHUNTS MV Decel Time: 289 msec     Systemic VTI:  0.25 m MV E velocity: 108.00 cm/s  Systemic Diam: 1.70 cm MV A velocity: 102.00 cm/s MV E/A ratio:  1.06 Mertie Moores MD Electronically signed by Mertie Moores MD Signature Date/Time: 05/23/2019/11:26:04 AM    Final    DG UGI W SMALL BOWEL  Result Date: 05/23/2019 CLINICAL DATA:  Bowel leak. EXAM: UPPER GI SERIES WITH KUB TECHNIQUE: After obtaining a scout radiograph a routine upper GI series was performed using water-soluble contrast only FLUOROSCOPY TIME:  Fluoroscopy Time:  4 minutes 36 seconds Radiation Exposure Index (if provided by the fluoroscopic device): 1.2 mGy Number of Acquired Spot Images: 4 COMPARISON:  CT study of May 21, 2019 and prior study from April 11, 2019 FINDINGS: Initial scout images show a surgical drain in the right upper quadrant and a pigtail drainage catheter near the midline. Study was performed only with water-soluble contrast. During the examination which was performed in the bilateral oblique and right lateral position the esophagus showed some evidence of tertiary peristalsis and a patulous appearance with moderate gastroesophageal reflux. Question of mild distal  esophageal narrowing not well assessed. Stomach was grossly normal. Patient was placed in the right lateral position and contrast passed from the stomach into the small bowel. When contrast reached the level of the proximal jejunum a tract extended anteriorly towards the anterior abdominal wall and more distal small bowel loops began to fill, at least at 2 additional locations. Gas can be seen surrounding the drainage catheter. Paragraphs shortly after the tract extending from the jejunum is filled with contrast bowel loops to the right of the midline also began to fill and bowel loops inferior to the central abdominal collection. The at least 2 sites of communication or possible based on this study and based on review of previous imaging contrast  did not fill the drain, likely passing via route of least resistance into the bowel beyond. 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 Difficult to map given the nature the process in communication with proximal small bowel where the first site of communication likely involves the proximal jejunum. Follow-up CT could be performed with positive enteric contrast. Alternatively, waiting for clearance of current contrast followed by a CT after drain injection could be helpful to further map this complex process as clinically warranted. Injection of the drain followed by CT would give the added advantage of three-dimensional relationships and knowledge of what is in definitive communication with the collection. Electronically Signed   By: Zetta Bills M.D.   On: 05/23/2019 11:40     LOS: 3 days   Time spent: More than 50% of that time was spent in counseling and/or coordination of care.  Antonieta Pert, MD [facility]  05/25/2019, 7:59 AM

## 2019-05-25 NOTE — Plan of Care (Signed)
  Problem: Pain Managment: Goal: General experience of comfort will improve Outcome: Progressing   

## 2019-05-25 NOTE — Progress Notes (Signed)
Subjective/Chief Complaint: No complaints, tol diet, right sided drain out overnight   Objective: Vital signs in last 24 hours: Temp:  [98.1 F (36.7 C)-98.5 F (36.9 C)] 98.1 F (36.7 C) (03/14 0830) Pulse Rate:  [68-75] 71 (03/14 0830) Resp:  [15-18] 18 (03/14 0830) BP: (119-157)/(63-80) 157/68 (03/14 0830) SpO2:  [95 %-99 %] 96 % (03/14 0830) Weight:  [54.6 kg] 54.6 kg (03/13 2007) Last BM Date: 05/23/19  Intake/Output from previous day: 03/13 0701 - 03/14 0700 In: 1863.6 [P.O.:960; I.V.:903.6] Out: 870 [Urine:650; Stool:220] Intake/Output this shift: Total I/O In: 240 [P.O.:240] Out: 300 [Urine:300]   Abd: soft, nontender, nondistended, [place] in place over fistula with GI contents in bag, IR drain in place also with dark brown fluid    Lab Results:  Recent Labs    05/24/19 0747 05/25/19 0632  WBC 8.3 8.4  HGB 7.0* 7.1*  HCT 22.1* 22.4*  PLT 226 260   BMET Recent Labs    05/24/19 0522 05/25/19 0632  NA 135 134*  K 4.7 3.8  CL 106 105  CO2 19* 21*  GLUCOSE 90 124*  BUN 38* 24*  CREATININE 1.01* 0.84  CALCIUM 8.0* 8.1*   PT/INR No results for input(s): LABPROT, INR in the last 72 hours. ABG No results for input(s): PHART, HCO3 in the last 72 hours.  Invalid input(s): PCO2, PO2  Studies/Results: DG Swallowing Func-Speech Pathology  Result Date: 05/24/2019 Objective Swallowing Evaluation: Type of Study: MBS-Modified Barium Swallow Study  Patient Details Name: Gina [name] MRN: [id number] Date of Birth: [birth year] Today's Date: 05/24/2019 Time: SLP Start Time (ACUTE ONLY): 7209 -SLP Stop Time (ACUTE ONLY): 1043 SLP Time Calculation (min) (ACUTE ONLY): 8 min Past Medical History: Past Medical History: Diagnosis Date . Diabetes mellitus  . Hypertension  . Osteomyelitis of right foot ([place]) 02/22/2017 . Perforated gastric ulcer ([place])  . S/P bilateral BKA (below knee amputation) ([place])  . Schizophrenia ([place])  . Schizophrenia ([place])  . Septic arthritis of  interphalangeal joint of toe of left foot ([place]) 06/02/2016 . Stress incontinence 04/26/2017 Past Surgical History: Past Surgical History: Procedure Laterality Date . AMPUTATION Left 06/05/2016  Procedure: LEFT FOOT TRANSMETATARSAL AMPUTATION;  Surgeon: [name], MD;  Location: [facility];  Service: Orthopedics;  Laterality: Left; . AMPUTATION Right 11/11/2017  Procedure: AMPUTATION BELOW KNEE;  Surgeon: [name], MD;  Location: [place];  Service: Orthopedics;  Laterality: Right; . AMPUTATION Left 05/10/2018  Procedure: LEFT BELOW KNEE AMPUTATION;  Surgeon: [name], MD;  Location: [place];  Service: Orthopedics;  Laterality: Left; . AMPUTATION Left 12/28/2018  Procedure: AMPUTATION THIRD DIGIT;  Surgeon: Iran Planas, MD;  Location: [facility];  Service: Orthopedics;  Laterality: Left; . AMPUTATION TOE Right 02/23/2017  Procedure: AMPUTATION RIGHT THIRD TOE;  Surgeon: [name], MD;  Location: [place];  Service: Orthopedics;  Laterality: Right; . I & D EXTREMITY Right 11/09/2017  Procedure: Debride Ulcer Right Heel;  Surgeon: [name], MD;  Location: [place];  Service: Orthopedics;  Laterality: Right; . I & D EXTREMITY Left 12/28/2018  Procedure: IRRIGATION AND DEBRIDEMENT EXTREMITY;  Surgeon: Iran Planas, MD;  Location: [facility];  Service: Orthopedics;  Laterality: Left; . INCISION AND DRAINAGE OF WOUND Left 12/26/2018  Procedure: IRRIGATION AND DEBRIDEMENT LEFT HAND;  Surgeon: Iran Planas, MD;  Location: [facility];  Service: Orthopedics;  Laterality: Left; . LAPAROTOMY N/A 02/02/2019  Procedure: EXPLORATORY LAPAROTOMY WITH OVERSEW OF DUODENAL ULCER;  Surgeon: Alphonsa Overall, MD;  Location: [facility];  Service: General;  Laterality: N/A; . TUBAL LIGATION   HPI: Gina [name] is a 59 y.o. F with hx DM, HTN, bilateral BKA, schizophrenia, and recent perforated ulcer leading to septic shock, DKA, fungemia, and persistent peritoneal abscess who presents with malaise, found to have MRSA bacteremia. Patient admitted  in Nov 2020 for septic shock, found to have acute peritonitis. [facility] Bactrim, and anidulafungin for total 6 weeks. Based on chart review, patient was seen for dysphagia and participated in [facility] 03/2019 while at [facility]. However, full notes not available. She was then discharged from [facility] to [facility], where she has been for last three months.  Pt now admitted with MRSA. CXR 3/10: Bibasilar atelectasis or infiltrates, right greater than left. Small right pleural effusion.  No data recorded Assessment / Plan / Recommendation CHL IP CLINICAL IMPRESSIONS 05/24/2019 Clinical Impression Patient presensts with a largely normal, age appropriate, oropharyngeal swallow. Oral phase timely with swift oral transit of bolus. Swallow triggered mostly at the vallecula with one episode of frank penetration of thin liquids which remained under the epiglottis post swallow however cleared spontaneously with subsequent swallows before reaching the level of the vocal cords. No additional episodes of penetration or aspiration despite being challenged with multiple consecutive straw sips of liquid. Note that patient unable to orally  transit pill with thin liquids, able with pureed solids, however no trouble reported at bedside with pills by patient or RN. Recommend initiation of regular texture solids, thin liquid (per MD, "diet as tolerated"). SLP will f/u briefly for tolerance.  SLP Visit Diagnosis Dysphagia, unspecified (R13.10) Attention and concentration deficit following -- Frontal lobe and executive function deficit following -- Impact on safety and function No limitations   CHL IP TREATMENT RECOMMENDATION 05/24/2019 Treatment Recommendations Therapy as outlined in treatment plan below   Prognosis 05/23/2019 Prognosis for Safe Diet Advancement Fair Barriers to Reach Goals Severity of deficits;Time post onset Barriers/Prognosis Comment -- CHL IP DIET RECOMMENDATION 05/24/2019 SLP  Diet Recommendations Regular solids;Thin liquid Liquid Administration via Cup;Straw Medication Administration Whole meds with liquid Compensations Slow rate;Small sips/bites Postural Changes Seated upright at 90 degrees   CHL IP OTHER RECOMMENDATIONS 05/24/2019 Recommended Consults -- Oral Care Recommendations Oral care BID Other Recommendations --   CHL IP FOLLOW UP RECOMMENDATIONS 05/24/2019 Follow up Recommendations None   CHL IP FREQUENCY AND DURATION 05/24/2019 Speech Therapy Frequency (ACUTE ONLY) min 1 x/week Treatment Duration 1 week      CHL IP ORAL PHASE 05/24/2019 Oral Phase WFL Oral - Pudding Teaspoon -- Oral - Pudding Cup -- Oral - Honey Teaspoon -- Oral - Honey Cup -- Oral - Nectar Teaspoon -- Oral - Nectar Cup -- Oral - Nectar Straw -- Oral - Thin Teaspoon -- Oral - Thin Cup -- Oral - Thin Straw -- Oral - Puree -- Oral - Mech Soft -- Oral - Regular -- Oral - Multi-Consistency -- Oral - Pill -- Oral Phase - Comment --  CHL IP PHARYNGEAL PHASE 05/24/2019 Pharyngeal Phase WFL Pharyngeal- Pudding Teaspoon -- Pharyngeal -- Pharyngeal- Pudding Cup -- Pharyngeal -- Pharyngeal- Honey Teaspoon -- Pharyngeal -- Pharyngeal- Honey Cup -- Pharyngeal -- Pharyngeal- Nectar Teaspoon -- Pharyngeal -- Pharyngeal- Nectar Cup -- Pharyngeal -- Pharyngeal- Nectar Straw -- Pharyngeal -- Pharyngeal- Thin Teaspoon -- Pharyngeal -- Pharyngeal- Thin Cup -- Pharyngeal -- Pharyngeal- Thin Straw -- Pharyngeal -- Pharyngeal- Puree -- Pharyngeal -- Pharyngeal- Mechanical Soft -- Pharyngeal -- Pharyngeal- Regular -- Pharyngeal -- Pharyngeal- Multi-consistency -- Pharyngeal --  Pharyngeal- Pill -- Pharyngeal -- Pharyngeal Comment --  CHL IP CERVICAL ESOPHAGEAL PHASE 05/24/2019 Cervical Esophageal Phase WFL Pudding Teaspoon -- Pudding Cup -- Honey Teaspoon -- Honey Cup -- Nectar Teaspoon -- Nectar Cup -- Nectar Straw -- Thin Teaspoon -- Thin Cup -- Thin Straw -- Puree -- Mechanical Soft -- Regular -- Multi-consistency -- Pill -- Cervical  Esophageal Comment -- Gabriel Rainwater MA, CCC-SLP [place] Leah Meryl 05/24/2019, 11:11 AM              ECHOCARDIOGRAM COMPLETE  Result Date: 05/23/2019    ECHOCARDIOGRAM REPORT   Patient Name:   Gina [name] Date of Exam: 05/23/2019 Medical Rec #:  [id number]       Height:       62.0 in Accession #:    [id number]      Weight:       120.4 lb Date of Birth:  02-Oct-1960        BSA:          1.541 m Patient Age:    59 years        BP:           128/61 mmHg Patient Gender: F               HR:           76 bpm. Exam Location:  Inpatient Procedure: 2D Echo Indications:    bacteremia 790.7  History:        Patient has prior history of Echocardiogram examinations, most                 recent 03/04/2019. Signs/Symptoms:Bacteremia; Risk                 Factors:Diabetes and Hypertension.  Sonographer:    Johny Chess Referring Phys: [id number] CHRISTOPHER P DANFORD IMPRESSIONS  1. Left ventricular ejection fraction, by estimation, is 55 to 60%. The left ventricle has normal function. The left ventricle has no regional wall motion abnormalities. Left ventricular diastolic parameters were normal.  2. Right ventricular systolic function is normal. The right ventricular size is normal.  3. The mitral valve is normal in structure. No evidence of mitral valve regurgitation. No evidence of mitral stenosis.  4. The aortic valve is normal in structure. Aortic valve regurgitation is not visualized. No aortic stenosis is present. FINDINGS  Left Ventricle: Left ventricular ejection fraction, by estimation, is 55 to 60%. The left ventricle has normal function. The left ventricle has no regional wall motion abnormalities. The left ventricular internal cavity size was normal in size. There is  no left ventricular hypertrophy. Left ventricular diastolic parameters were normal. Right Ventricle: The right ventricular size is normal. No increase in right ventricular wall thickness. Right ventricular systolic function is normal. Left Atrium: Left  atrial size was normal in size. Right Atrium: Right atrial size was normal in size. Pericardium: There is no evidence of pericardial effusion. Mitral Valve: The mitral valve is normal in structure. No evidence of mitral valve regurgitation. No evidence of mitral valve stenosis. Tricuspid Valve: The tricuspid valve is normal in structure. Tricuspid valve regurgitation is trivial. Aortic Valve: The aortic valve is normal in structure. Aortic valve regurgitation is not visualized. No aortic stenosis is present. Pulmonic Valve: The pulmonic valve was normal in structure. Pulmonic valve regurgitation is not visualized. Aorta: The aortic root and ascending aorta are structurally normal, with no evidence of dilitation. IAS/Shunts: The atrial septum is grossly normal.  LEFT VENTRICLE PLAX 2D LVIDd:  4.30 cm  Diastology LVIDs:         3.10 cm  LV e' lateral:   8.81 cm/s LV PW:         1.00 cm  LV E/e' lateral: 12.3 LV IVS:        0.80 cm  LV e' medial:    6.20 cm/s LVOT diam:     1.70 cm  LV E/e' medial:  17.4 LV SV:         57 LV SV Index:   37 LVOT Area:     2.27 cm  RIGHT VENTRICLE RV S prime:     12.00 cm/s TAPSE (M-mode): 1.8 cm LEFT ATRIUM             Index       RIGHT ATRIUM           Index LA diam:        2.80 cm 1.82 cm/m  RA Area:     10.80 cm LA Vol (A2C):   34.3 ml 22.26 ml/m RA Volume:   21.90 ml  14.21 ml/m LA Vol (A4C):   34.5 ml 22.39 ml/m LA Biplane Vol: 36.3 ml 23.56 ml/m  AORTIC VALVE LVOT Vmax:   139.00 cm/s LVOT Vmean:  90.400 cm/s LVOT VTI:    0.251 m  AORTA Ao Root diam: 2.60 cm MITRAL VALVE MV Area (PHT): 2.62 cm     SHUNTS MV Decel Time: 289 msec     Systemic VTI:  0.25 m MV E velocity: 108.00 cm/s  Systemic Diam: 1.70 cm MV A velocity: 102.00 cm/s MV E/A ratio:  1.06 Mertie Moores MD Electronically signed by Mertie Moores MD Signature Date/Time: 05/23/2019/11:26:04 AM    Final    DG UGI W SMALL BOWEL  Result Date: 05/23/2019 CLINICAL DATA:  Bowel leak. EXAM: UPPER GI SERIES WITH  KUB TECHNIQUE: After obtaining a scout radiograph a routine upper GI series was performed using water-soluble contrast only FLUOROSCOPY TIME:  Fluoroscopy Time:  4 minutes 36 seconds Radiation Exposure Index (if provided by the fluoroscopic device): 1.2 mGy Number of Acquired Spot Images: 4 COMPARISON:  CT study of May 21, 2019 and prior study from April 11, 2019 FINDINGS: Initial scout images show a surgical drain in the right upper quadrant and a pigtail drainage catheter near the midline. Study was performed only with water-soluble contrast. During the examination which was performed in the bilateral oblique and right lateral position the esophagus showed some evidence of tertiary peristalsis and a patulous appearance with moderate gastroesophageal reflux. Question of mild distal esophageal narrowing not well assessed. Stomach was grossly normal. Patient was placed in the right lateral position and contrast passed from the stomach into the small bowel. When contrast reached the level of the proximal jejunum a tract extended anteriorly towards the anterior abdominal wall and more distal small bowel loops began to fill, at least at 2 additional locations. Gas can be seen surrounding the drainage catheter. Paragraphs shortly after the tract extending from the jejunum is filled with contrast bowel loops to the right of the midline also began to fill and bowel loops inferior to the central abdominal collection. The at least 2 sites of communication or possible based on this study and based on review of previous imaging contrast did not fill the drain, likely passing via route of least resistance into the bowel beyond. 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 Difficult to map given the nature the process in communication with proximal  small bowel where the first site of communication likely involves the proximal jejunum. Follow-up CT could be performed with positive enteric contrast. Alternatively, waiting for clearance of current contrast followed by a CT after drain injection could be helpful to further map this complex process as clinically warranted. Injection of the drain followed by CT would give the added advantage of three-dimensional relationships and knowledge of what is in definitive communication with the collection. Electronically Signed   By: Zetta Bills M.D.   On: 05/23/2019 11:40    Anti-infectives: Anti-infectives (From admission, onward)   Start     Dose/Rate Route Frequency Ordered Stop   05/24/19 2200  vancomycin (VANCOREADY) IVPB 750 mg/150 mL     750 mg 150 mL/hr over 60 Minutes Intravenous Every 24 hours 05/24/19 1156     05/23/19 2200  vancomycin (VANCOCIN) IVPB 1000 mg/200 mL premix     1,000 mg 200 mL/hr over 60 Minutes Intravenous  Once 05/23/19 1345 05/24/19 0008   05/23/19 1113  vancomycin variable dose per unstable renal function (pharmacist dosing)  Status:  Discontinued      Does not apply See admin instructions 05/23/19 1114 05/24/19 1156   05/22/19 1500  vancomycin (VANCOREADY) IVPB 1250 mg/250 mL  Status:  Discontinued     1,250 mg 166.7 mL/hr over 90 Minutes Intravenous Every 24 hours 05/22/19 1429 05/23/19 1114      Assessment/Plan:  MRSA bacteremia - per primary, PICC line out, on vancomycin  Perforated prepyloric ulcer S/p EXPLORATORY LAPAROTOMY WITH OVERSEW OF DUODENAL ULCER with [name] patch 02/03/2019 Dr. Lucia Gaskins [facility] Fistula - JP surgical drain fell out  and 1 IR drain placed 12/16 in place with enteric contents  - UGI today shows fistulous connection at 2 points on the small bowel (proximal small bowel, suspect 1st site of communication is proximal jejunum) connecting to the known collection in the anterior abdomen and to the skin - may need drain injection studies to  further characterize fistulas in the future, as well as EGD - continue strict I&O's of output from ECF and drain  - diet -no indication to replace drain that fell out. This doesn't appear to be necessary VTE - SCDs, lovenox Foley - in place Follow up - TBD  Rolm Bookbinder 05/25/2019

## 2019-05-25 NOTE — Consult Note (Signed)
[facility] Nurse Consult Note: Reason for Consult: Reconsulted for sacral wound, full thickness. The original Stage of this pressure injury is not known. I first saw this wound in December 2020 at which time it was Unstageable. Patient seen on Friday for her bilateral LE wounds and her lower abdominal fistula by my partner, S. Doty. Wound type:Pressure Pressure Injury POA: Yes Measurement: 8cm x 3.8cm x 0.4cm with area of exposed white tissue measuring 3xm x 0.4cm. Surrounding tissue is red, moist. Exudate is serous in a small to moderate amount.  Normal saline dressing has just been placed and covered with silicone foam dressing. Wound bed:As described above Drainage (amount, consistency, odor) As described abvoe Periwound: Intact, dry Dressing procedure/placement/frequency: Patient is on a mattress replacement with low air loss feature. She is being turned and repositioned from side to side.  She reports that she has refused therapy lately at her facility due to pain when moving around in bed, using sliding board and sitting in wheelchair. She is able to articulate that increased protein (without added sugars) is required to heal her wound and tells me that she is taking her Glucerna. Patient reports she has an excellent Wound Treatment Nurse at her facility. I have provided wound care orders for the Nursing staff for the continuing care of the Sacral Stage 3 pressure injury.   [place] nursing team will not follow, but will remain available to this patient, the nursing and medical teams.  Please re-consult if needed. Thanks, Maudie Flakes, MSN, RN, [place], Arther Abbott  Pager# [phone]
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
## 2019-05-26 ENCOUNTER — Telehealth: Payer: Self-pay

## 2019-05-26 DIAGNOSIS — B9562 Methicillin resistant Staphylococcus aureus infection as the cause of diseases classified elsewhere: Secondary | ICD-10-CM

## 2019-05-26 DIAGNOSIS — K632 Fistula of intestine: Secondary | ICD-10-CM

## 2019-05-26 DIAGNOSIS — R197 Diarrhea, unspecified: Secondary | ICD-10-CM

## 2019-05-26 DIAGNOSIS — R7881 Bacteremia: Secondary | ICD-10-CM

## 2019-05-26 DIAGNOSIS — Z9989 Dependence on other enabling machines and devices: Secondary | ICD-10-CM

## 2019-05-26 DIAGNOSIS — Z95828 Presence of other vascular implants and grafts: Secondary | ICD-10-CM

## 2019-05-26 LAB — COMPREHENSIVE METABOLIC PANEL
ALT: 23 U/L (ref 0–44)
AST: 19 U/L (ref 15–41)
Albumin: 1.6 g/dL — ABNORMAL LOW (ref 3.5–5.0)
Alkaline Phosphatase: 152 U/L — ABNORMAL HIGH (ref 38–126)
Anion gap: 11 (ref 5–15)
BUN: 18 mg/dL (ref 6–20)
CO2: 17 mmol/L — ABNORMAL LOW (ref 22–32)
Calcium: 8.3 mg/dL — ABNORMAL LOW (ref 8.9–10.3)
Chloride: 108 mmol/L (ref 98–111)
Creatinine, Ser: 0.83 mg/dL (ref 0.44–1.00)
GFR calc Af Amer: >60 mL/min (ref >60)
GFR calc non Af Amer: >60 mL/min (ref >60)
Glucose, Bld: 139 mg/dL — ABNORMAL HIGH (ref 70–99)
Potassium: 3.7 mmol/L (ref 3.5–5.1)
Sodium: 136 mmol/L (ref 135–145)
Total Bilirubin: 0.8 mg/dL (ref 0.3–1.2)
Total Protein: 6.9 g/dL (ref 6.5–8.1)

## 2019-05-26 LAB — CBC
HCT: 24.1 % — ABNORMAL LOW (ref 36.0–46.0)
Hemoglobin: 7.5 g/dL — ABNORMAL LOW (ref 12.0–15.0)
MCH: 30 pg (ref 26.0–34.0)
MCHC: 31.1 g/dL (ref 30.0–36.0)
MCV: 96.4 fL (ref 80.0–100.0)
Platelets: 300 10*3/uL (ref 150–400)
RBC: 2.5 MIL/uL — ABNORMAL LOW (ref 3.87–5.11)
RDW: 14.8 % (ref 11.5–15.5)
WBC: 8.1 10*3/uL (ref 4.0–10.5)
nRBC: 0 % (ref 0.0–0.2)

## 2019-05-26 LAB — GLUCOSE, CAPILLARY
Glucose-Capillary: 132 mg/dL — ABNORMAL HIGH (ref 70–99)
Glucose-Capillary: 133 mg/dL — ABNORMAL HIGH (ref 70–99)
Glucose-Capillary: 139 mg/dL — ABNORMAL HIGH (ref 70–99)
Glucose-Capillary: 84 mg/dL (ref 70–99)
Glucose-Capillary: 85 mg/dL (ref 70–99)
Glucose-Capillary: 88 mg/dL (ref 70–99)
Glucose-Capillary: 89 mg/dL (ref 70–99)

## 2019-05-26 MED ORDER — SODIUM CHLORIDE 0.9 % IV SOLN: INTRAVENOUS | Status: DC

## 2019-05-26 MED ORDER — ADULT MULTIVITAMIN W/MINERALS CH
1.0000 | ORAL_TABLET | Freq: Every day | ORAL | Status: DC
  Administered 2019-05-26 – 2019-06-10 (×16): 1 via ORAL
  Filled 2019-05-26 (×16): qty 1

## 2019-05-26 NOTE — Progress Notes (Signed)
Nutrition Follow-up  RD working remotely.  DOCUMENTATION CODES:   Not applicable  INTERVENTION:   -Initiate 48 hour calorie count per MD request -Continue Ensure Enlive po TID, each supplement provides 350 kcal and 20 grams of protein -Continue 30 ml Prostat BID, each supplement provides 100 kcals and 15 grams protein -Magic cup TID with meals, each supplement provides 290 kcal and 9 grams of protein -MVI with minerals daily  NUTRITION DIAGNOSIS:   Inadequate oral intake related to inability to eat, altered GI function as evidenced by NPO status.  Progressing; advanced to carb modified diet on 05/26/19  GOAL:   Patient will meet greater than or equal to 90% of their needs  Progressing   MONITOR:   Labs, Weight trends, Skin, Other (Comment)(nutrition poc)  REASON FOR ASSESSMENT:   Consult Assessment of nutrition requirement/status, Calorie Count  ASSESSMENT:   59 yo female presents with malaise from [facility] and admitted with MRSA bacteremia. Pt admitted 01/2019 with septic shock and found to have acute peritonitis and underwent ex lap with partial SB resection and intra-abdominal abscess. Pt with SB EC fistula on TPNPMH includes DM, HTN, bilateral BKA, schizophrenia.  3/11- NGT removed 3/12- per general surgery notes, UGI reveals 2 separate EC fistulae 3/13- s/p MBSS- advanced to regular diet with thin liquids; JP drain d/c   Reviewed I/O's: -779 ml x 24 hours and -1.2 L since admission  UOP: 1.2 L x 24 hours  Drain output: 20 ml x 24 hours  Colostomy output: 945 ml x 24 hours  Per general surgery notes, pt may require drain studies to better characterize fistulas. Pt complains of abdominal discomfort; drain and eakins pouch with enteric appearing contents.   Plan for TEE tomorrow due to MRSA bacteremia.   Attempted to speak with pt via phone, however, no answer.   Pt advanced to PO diet on 05/24/19 s/p MBSS. Per SLP notes, pt tolerating diet texture  well, however, pt with minimal intake. General surgery services requesting calorie count in hopes that pt will not require TPN.   Documented meal completion 10-15%. Pt consumed 25% of breakfast (128 kcals, 7 grams protein).  Pt with poor oral intake and would benefit from nutrient dense supplement. One Ensure Enlive supplement provides 350 kcals, 20 grams protein, and 44-45 grams of carbohydrate vs one Glucerna shake supplement, which provides 220 kcals, 10 grams of protein, and 26 grams of carbohydrate. Given pt's hx of DM, RD will reassess adequacy of PO intake, CBGS, and adjust supplement regimen as appropriate at follow-up.   Labs reviewed: CBGS: 132-156 (inpatient orders for glycemic control are 0-9 units insulin aspart every 4 hours).   Diet Order:   Diet Order            Diet Carb Modified Fluid consistency: Thin; Room service appropriate? Yes  Diet effective now              EDUCATION NEEDS:   Not appropriate for education at this time  Skin:  Skin Assessment: Skin Integrity Issues: Skin Integrity Issues:: Stage II, Unstageable Stage II: lt leg x 2, coccyx Unstageable: b/l BKA stumps Other: Midline, lower abdominal EC fistula  Last BM:  05/26/19 (300 ml via colostomy)  Height:   Ht Readings from Last 1 Encounters:  05/23/19 5\' 2"  (1.575 m)    Weight:   Wt Readings from Last 1 Encounters:  05/25/19 54.6 kg   BMI:  Body mass index is 22.02 kg/m.  Estimated Nutritional Needs:  Kcal:  1800-2000 kcals  Protein:  83-110 g  Fluid:  >/= 1.9 L    Loistine Chance, RD, LDN, [place] Registered Dietitian II Certified Diabetes Care and Education Specialist Please refer to [facility] for RD and/or RD on-call/weekend/after hours pager

## 2019-05-26 NOTE — Progress Notes (Signed)
[facility] for Infectious Disease   Reason for visit: Follow up on bacteremia  Interval History: tolerating po intake.  WBC wnl, afebrile. No acute events.  No complaints today.    Physical Exam: Constitutional:  Vitals:   05/26/19 0412 05/26/19 0916  BP: 129/72 (!) 143/71  Pulse: 75 78  Resp: 16 20  Temp: 98.2 F (36.8 C) 98 F (36.7 C)  SpO2: 97% 99%   patient appears in NAD Eyes: anicteric Respiratory: Normal respiratory effort; CTA B Cardiovascular: RRR GI: soft, nt, nd  Review of Systems: Constitutional: negative for fevers and chills Gastrointestinal: positive for diarrhea, negative for nausea  Lab Results  Component Value Date   WBC 8.1 05/26/2019   HGB 7.5 (L) 05/26/2019   HCT 24.1 (L) 05/26/2019   MCV 96.4 05/26/2019   PLT 300 05/26/2019    Lab Results  Component Value Date   CREATININE 0.83 05/26/2019   BUN 18 05/26/2019   NA 136 05/26/2019   K 3.7 05/26/2019   CL 108 05/26/2019   CO2 17 (L) 05/26/2019    Lab Results  Component Value Date   ALT 23 05/26/2019   AST 19 05/26/2019   ALKPHOS 152 (H) 05/26/2019     Microbiology: Recent Results (from the past 240 hour(s))  Urine Culture     Status: Abnormal   Collection Time: 05/21/19 10:00 AM   Specimen: Urine, Catheterized  Result Value Ref Range Status   Specimen Description URINE, CATHETERIZED  Final   Special Requests   Final    NONE Performed at [facility], [address], [place], [place] [address]    Culture (A)  Final    >=100,000 COLONIES/mL PSEUDOMONAS AERUGINOSA 50,000 COLONIES/mL VANCOMYCIN RESISTANT ENTEROCOCCUS ISOLATED    Report Status 05/24/2019 FINAL  Final   Organism ID, Bacteria PSEUDOMONAS AERUGINOSA (A)  Final   Organism ID, Bacteria VANCOMYCIN RESISTANT ENTEROCOCCUS ISOLATED (A)  Final      Susceptibility   Pseudomonas aeruginosa - MIC*    CEFTAZIDIME 4 SENSITIVE Sensitive     CIPROFLOXACIN <=0.25 SENSITIVE Sensitive     GENTAMICIN <=1 SENSITIVE  Sensitive     IMIPENEM 1 SENSITIVE Sensitive     PIP/TAZO <=4 SENSITIVE Sensitive     CEFEPIME 2 SENSITIVE Sensitive     * >=100,000 COLONIES/mL PSEUDOMONAS AERUGINOSA   Vancomycin resistant enterococcus isolated - MIC*    AMPICILLIN >=32 RESISTANT Resistant     NITROFURANTOIN 256 RESISTANT Resistant     VANCOMYCIN >=32 RESISTANT Resistant     LINEZOLID 2 SENSITIVE Sensitive     * 50,000 COLONIES/mL VANCOMYCIN RESISTANT ENTEROCOCCUS ISOLATED  Respiratory Panel by RT PCR (Flu A&B, Covid) - Nasopharyngeal Swab     Status: None   Collection Time: 05/21/19 10:03 AM   Specimen: Nasopharyngeal Swab  Result Value Ref Range Status   SARS Coronavirus 2 by RT PCR NEGATIVE NEGATIVE Final    Comment: (NOTE) SARS-CoV-2 target nucleic acids are NOT DETECTED. The SARS-CoV-2 RNA is generally detectable in upper respiratoy specimens during the acute phase of infection. The lowest concentration of SARS-CoV-2 viral copies this assay can detect is 131 copies/mL. A negative result does not preclude SARS-Cov-2 infection and should not be used as the sole basis for treatment or other patient management decisions. A negative result may occur with  improper specimen collection/handling, submission of specimen other than nasopharyngeal swab, presence of viral mutation(s) within the areas targeted by this assay, and inadequate number of viral copies (<131 copies/mL).  A negative result must be combined with clinical observations, patient history, and epidemiological information. The expected result is Negative. Fact Sheet for Patients:  PinkCheek.be Fact Sheet for Healthcare Providers:  GravelBags.it This test is not yet ap proved or cleared by the [place] FDA and  has been authorized for detection and/or diagnosis of SARS-CoV-2 by FDA under an Emergency Use Authorization (EUA). This EUA will remain  in effect (meaning this test can be used) for  the duration of the COVID-19 declaration under Section 564(b)(1) of the Act, 21 U.S.C. section 360bbb-3(b)(1), unless the authorization is terminated or revoked sooner.    Influenza A by PCR NEGATIVE NEGATIVE Final   Influenza B by PCR NEGATIVE NEGATIVE Final    Comment: (NOTE) The Xpert Xpress SARS-CoV-2/FLU/RSV assay is intended as an aid in  the diagnosis of influenza from Nasopharyngeal swab specimens and  should not be used as a sole basis for treatment. Nasal washings and  aspirates are unacceptable for Xpert Xpress SARS-CoV-2/FLU/RSV  testing. Fact Sheet for Patients: PinkCheek.be Fact Sheet for Healthcare Providers: GravelBags.it This test is not yet approved or cleared by the [place] FDA and  has been authorized for detection and/or diagnosis of SARS-CoV-2 by  FDA under an Emergency Use Authorization (EUA). This EUA will remain  in effect (meaning this test can be used) for the duration of the  Covid-19 declaration under Section 564(b)(1) of the Act, 21  U.S.C. section 360bbb-3(b)(1), unless the authorization is  terminated or revoked. Performed at [facility], [place] [address], [place], [place] [address]   Blood culture (routine x 2)     Status: Abnormal   Collection Time: 05/21/19 10:38 AM   Specimen: BLOOD RIGHT ARM  Result Value Ref Range Status   Specimen Description BLOOD RIGHT ARM  Final   Special Requests   Final    AEROBIC BOTTLE ONLY Blood Culture results may not be optimal due to an inadequate volume of blood received in culture bottles   Culture  Setup Time   Final    GRAM POSITIVE COCCI IN CLUSTERS AEROBIC BOTTLE ONLY CRITICAL RESULT CALLED TO, READ BACK BY AND VERIFIED WITH: Macky Lower RN, AT [phone] 05/22/19 BY Rush Landmark Performed at [facility], [place] [address] [place], [place] [address]    Culture METHICILLIN RESISTANT STAPHYLOCOCCUS AUREUS (A)  Final   Report Status 05/24/2019  FINAL  Final   Organism ID, Bacteria METHICILLIN RESISTANT STAPHYLOCOCCUS AUREUS  Final      Susceptibility   Methicillin resistant staphylococcus aureus - MIC*    CIPROFLOXACIN >=8 RESISTANT Resistant     ERYTHROMYCIN <=0.25 SENSITIVE Sensitive     GENTAMICIN <=0.5 SENSITIVE Sensitive     OXACILLIN >=4 RESISTANT Resistant     TETRACYCLINE <=1 SENSITIVE Sensitive     VANCOMYCIN 1 SENSITIVE Sensitive     TRIMETH/SULFA <=10 SENSITIVE Sensitive     CLINDAMYCIN <=0.25 SENSITIVE Sensitive     RIFAMPIN <=0.5 SENSITIVE Sensitive     Inducible Clindamycin NEGATIVE Sensitive     * METHICILLIN RESISTANT STAPHYLOCOCCUS AUREUS  Blood culture (routine x 2)     Status: Abnormal   Collection Time: 05/21/19 10:38 AM   Specimen: BLOOD RIGHT HAND  Result Value Ref Range Status   Specimen Description BLOOD RIGHT HAND  Final   Special Requests   Final    AEROBIC BOTTLE ONLY Blood Culture results may not be optimal due to an inadequate volume of blood received in culture bottles  Culture  Setup Time   Final    AEROBIC BOTTLE ONLY GRAM POSITIVE COCCI IN CLUSTERS CRITICAL RESULT CALLED TO, READ BACK BY AND VERIFIED WITH: K NEAL RN 05/22/19 0137 JDW    Culture (A)  Final    STAPHYLOCOCCUS AUREUS SUSCEPTIBILITIES PERFORMED ON PREVIOUS CULTURE WITHIN THE LAST 5 DAYS. Performed at [facility], [place] [address] [place], [place] [address]    Report Status 05/24/2019 FINAL  Final  Blood Culture ID Panel (Reflexed)     Status: Abnormal   Collection Time: 05/21/19 10:38 AM  Result Value Ref Range Status   Enterococcus species NOT DETECTED NOT DETECTED Final   Listeria monocytogenes NOT DETECTED NOT DETECTED Final   Staphylococcus species DETECTED (A) NOT DETECTED Final    Comment: CRITICAL RESULT CALLED TO, READ BACK BY AND VERIFIED WITH: S. COBLE RN, AT [phone] 05/22/19 BY D. VANHOOK    Staphylococcus aureus (BCID) DETECTED (A) NOT DETECTED Final    Comment: Methicillin (oxacillin)-resistant  Staphylococcus aureus (MRSA). MRSA is predictably resistant to beta-lactam antibiotics (except ceftaroline). Preferred therapy is vancomycin unless clinically contraindicated. Patient requires contact precautions if  hospitalized. CRITICAL RESULT CALLED TO, READ BACK BY AND VERIFIED WITH: S. COBLE RN, AT [phone] 05/22/19 BY D. VANHOOK    Methicillin resistance DETECTED (A) NOT DETECTED Final    Comment: CRITICAL RESULT CALLED TO, READ BACK BY AND VERIFIED WITH: S. COBLE RN, AT [phone] 05/22/19 BY D. VANHOOK    Streptococcus species NOT DETECTED NOT DETECTED Final   Streptococcus agalactiae NOT DETECTED NOT DETECTED Final   Streptococcus pneumoniae NOT DETECTED NOT DETECTED Final   Streptococcus pyogenes NOT DETECTED NOT DETECTED Final   Acinetobacter baumannii NOT DETECTED NOT DETECTED Final   Enterobacteriaceae species NOT DETECTED NOT DETECTED Final   Enterobacter cloacae complex NOT DETECTED NOT DETECTED Final   Escherichia coli NOT DETECTED NOT DETECTED Final   Klebsiella oxytoca NOT DETECTED NOT DETECTED Final   Klebsiella pneumoniae NOT DETECTED NOT DETECTED Final   Proteus species NOT DETECTED NOT DETECTED Final   Serratia marcescens NOT DETECTED NOT DETECTED Final   Haemophilus influenzae NOT DETECTED NOT DETECTED Final   Neisseria meningitidis NOT DETECTED NOT DETECTED Final   Pseudomonas aeruginosa NOT DETECTED NOT DETECTED Final   Candida albicans NOT DETECTED NOT DETECTED Final   Candida glabrata NOT DETECTED NOT DETECTED Final   Candida krusei NOT DETECTED NOT DETECTED Final   Candida parapsilosis NOT DETECTED NOT DETECTED Final   Candida tropicalis NOT DETECTED NOT DETECTED Final    Comment: Performed at [facility], [place]. [address], [place], [place] [address]  SARS CORONAVIRUS 2 (TAT 6-24 HRS) Nasopharyngeal Nasopharyngeal Swab     Status: None   Collection Time: 05/22/19  2:45 PM   Specimen: Nasopharyngeal Swab  Result Value Ref Range Status   SARS Coronavirus 2  NEGATIVE NEGATIVE Final    Comment: (NOTE) SARS-CoV-2 target nucleic acids are NOT DETECTED. The SARS-CoV-2 RNA is generally detectable in upper and lower respiratory specimens during the acute phase of infection. Negative results do not preclude SARS-CoV-2 infection, do not rule out co-infections with other pathogens, and should not be used as the sole basis for treatment or other patient management decisions. Negative results must be combined with clinical observations, patient history, and epidemiological information. The expected result is Negative. Fact Sheet for Patients: SugarRoll.be Fact Sheet for Healthcare Providers: https://www.woods-mathews.com/ This test is not yet approved or cleared by the [place] and  has been authorized  for detection and/or diagnosis of SARS-CoV-2 by FDA under an Emergency Use Authorization (EUA). This EUA will remain  in effect (meaning this test can be used) for the duration of the COVID-19 declaration under Section 56 4(b)(1) of the Act, 21 U.S.C. section 360bbb-3(b)(1), unless the authorization is terminated or revoked sooner. Performed at [facility], [place] [address] [place], [place] [address]   Culture, blood (routine x 2)     Status: None (Preliminary result)   Collection Time: 05/23/19  5:34 AM   Specimen: BLOOD  Result Value Ref Range Status   Specimen Description BLOOD LEFT ARM  Final   Special Requests   Final    BOTTLES DRAWN AEROBIC AND ANAEROBIC Blood Culture results may not be optimal due to an excessive volume of blood received in culture bottles Performed at [place] [address] [place], [place] [address]    Culture NO GROWTH 3 DAYS  Final   Report Status PENDING  Incomplete  Culture, blood (routine x 2)     Status: None (Preliminary result)   Collection Time: 05/23/19  5:42 AM   Specimen: BLOOD  Result Value Ref Range Status   Specimen Description BLOOD  LEFT HAND  Final   Special Requests   Final    BOTTLES DRAWN AEROBIC AND ANAEROBIC Blood Culture adequate volume Performed at [facility], [place] [address], [place], [place] [address]    Culture NO GROWTH 3 DAYS  Final   Report Status PENDING  Incomplete    Impression/Plan:  1. MRSA bacteremia - likely from PICC line.  Repeat cultures ngtd.   TTE without concerns.  TEE tomorrow Duration will depend on TEE results   2.  Loose stools - increased oral feeds with Ensure and Pro Stat, particularly the last 24-48 hours and resultant loose stools.  No fever, no leukocytosis.  Not c/w C diff infection.  3.   EC fistula - tolerating po intake and may not need to continue with TPN.

## 2019-05-26 NOTE — Progress Notes (Signed)
PROGRESS NOTE    Gina [name]  RSW:[id number] DOB: March 21, 1960 DOA: 05/22/2019 PCP: [name], MD   Brief Narrative: 59 year old female with DM, HTN, schizophrenia, bilateral BKA sent from [facility] due to positive blood culture.  Recent admission November 2020 for perforated prepyloric ulcer with septic shock with acute peritonitis status post exlap with [name] patch on 11/23, and was discharged to select LTAC on Unasyn Bactrim and antifungal for total 6 weeks.  Patient was discharged with Foley catheter, PICC line, bilateral lower quadrant drains along with enterocutaneous fistula with colostomy bag . She was discharged from [facility]iere she has been living for last 3 months. Patient  Was sent to ER for vague sleepiness malaise anorexia low-grade fever and leukocytosis at SNF, and in the ER exam labs unremarkable and patient was sent back to SNF but overnight blood culture returned MRSA 2/2 and she was readmitted to the hospital.  Patient is being followed by infectious disease, general surgery.  She did well with speech, diet was restarted, PICC line was removed as no longer needing TPN and also needing line holidays given her MRSA bacteremia.  Foley catheter was exchanged.  Subjective: Seen and examined this morning alert awake pleasant. Diarrhea per rectum overnight some abdominal discomfort. Acute events overnight.  Afebrile T-max 98.4, saturating well on room air. RLQ JP drain came out 3/13 night-covered with dressing   Assessment & Plan:  MRSA bacteremia: indwelling PICC line used for TPN is felt likely source and PICC is out. Appreciate ID on board. Plan is to continue vancomycin repeat blood cultures 3/12-result pending, TTE negative for endocarditis. She has enterocutaneous fistula and drains continue per Surgery.  Seen by speech therapy passed MBS-and placed on diet 3/13.  Starting on 48-hour calorie count hopefully will not need TPN.  Off PICC line.She has b/l  stumps wound. Cont  wound care.  Await ID inputs for further plan regarding PICC line and iv antibiotics.  Intra-abdominal fluid collection with recent ex lap/partial small bowel resection and intra-abdominal abscess in November.General surgery following, right-sided drain came up 3/13 and surgery aware and dressing.  Having loose stool on the colostomy.Monitor her enterocutaneous fistula.  Recent admission November 2020 for perforated prepyloric ulcer with septic shock with acute peritonitis status post exlap with [name] patch on 11/23-patient undergoing bilateral lower quadrant drain, enterocutaneous fistula with colostomy bag.  Upper GI shows fistulous connection at 2 points in the small bowel (proximal small bowel, suspect 1st site of communication is proximal jejunum) connecting to the known collection in the anterior abdomen and io the skin.  May need drain injection to further characterize fistula in the future as well as EGD per surgery and will need to follow-up as outpatient  HTN: BP well controlled , cont amlodipine.  Metabolic acidosis/elevated BUN/dehydration: Tolerating diet slowly wean off IV fluids, bicarb is low suspect due to her fistula. BUN has normalized from 46-->18. Stop ivf.   Diabetes mellitus: hbA1c 5.9 3/12.  With hypoglycemia blood sugar 67 3/12, discontinued Lantus and  Placed on D5 NS-discontinue. Recent Labs  Lab 05/25/19 1140 05/25/19 1656 05/25/19 2035 05/26/19 0413 05/26/19 0726  GLUCAP 144* 156* 156* 133* 132*   Severe protein-calorie malnutrition: TPN dependent, dietitian on consult.  Now on oral diet continue supplementation, off TPN and PICC line.  Augment nutrition as tolerated.   Anemia of chronic disease: Previously hemoglobin has been ranging from 9 to 8 g, and drifting downward.  Suspect multifactorial with chronic disease, no obvious  bleeding present. HB low at 7.0., anemia panel ferritin 646, iron low at 22, B12 750, folate 22. Ordered fecal occult  blood,monitor hemoglobin, transfuse for less than 7 g or if symptomatic when she is more ambulatory.  Patient reports she has had received blood transfusion in the past.  Below-knee amputee/Atherosclerosis of native arteries: Stump evaluation by wound care for her ilateral stump wounds   Hypoalbuminemia/inadequate oral intake: Augment nutrition as tolerated.  Transaminitis: Suspect from TPN.  Monitor closely.  Asymptomatic bacteriuria: replaceD Foley after admission.  GERD: on PPI  Mood disorder:on Celexa  Goals of care: Palliative care consulted patient is DNR MOST form filled.  Per recommendation to treat what is treatable and plan on palliative care referral upon discharge. Given significant comorbidities malnutrition high risk for readmission and decompensation.  Interventions: Refer to RD note for recommendations Body mass index is 22.02 kg/m.  Pressure Ulcer: Pressure Injury 05/22/19 Coccyx Bilateral Stage 2 -  Partial thickness loss of dermis presenting as a shallow open injury with a red, pink wound bed without slough. (Active)  05/22/19 2104  Location: Coccyx  Location Orientation: Bilateral  Staging: Stage 2 -  Partial thickness loss of dermis presenting as a shallow open injury with a red, pink wound bed without slough.  Wound Description (Comments):   Present on Admission: Yes     Pressure Injury 05/22/19 Knee Right;Distal Unstageable - Full thickness tissue loss in which the base of the injury is covered by slough (yellow, tan, gray, green or brown) and/or eschar (tan, brown or black) in the wound bed. Stump (Active)  05/22/19 2106  Location: Knee  Location Orientation: Right;Distal  Staging: Unstageable - Full thickness tissue loss in which the base of the injury is covered by slough (yellow, tan, gray, green or brown) and/or eschar (tan, brown or black) in the wound bed.  Wound Description (Comments): Stump  Present on Admission: Yes     Pressure Injury 05/22/19  Knee Left;Distal Unstageable - Full thickness tissue loss in which the base of the injury is covered by slough (yellow, tan, gray, green or brown) and/or eschar (tan, brown or black) in the wound bed. Stump (Active)  05/22/19 2108  Location: Knee  Location Orientation: Left;Distal  Staging: Unstageable - Full thickness tissue loss in which the base of the injury is covered by slough (yellow, tan, gray, green or brown) and/or eschar (tan, brown or black) in the wound bed.  Wound Description (Comments): Stump  Present on Admission: Yes     Pressure Injury 05/22/19 Thigh Left;Mid Stage 2 -  Partial thickness loss of dermis presenting as a shallow open injury with a red, pink wound bed without slough. (Active)  05/22/19 2109  Location: Thigh  Location Orientation: Left;Mid  Staging: Stage 2 -  Partial thickness loss of dermis presenting as a shallow open injury with a red, pink wound bed without slough.  Wound Description (Comments):   Present on Admission: Yes   DVT prophylaxis:lovenox Code Status: DNR Family Communication: plan of care discussed with patient at bedside.  Reports her daughter is aware of the plan-Visiting today. Disposition Plan: Patient is from: SNF Anticipated Disposition:? to home with home and services.  Patient reports she is not going back to skilled nursing facility.   Barriers to discharge or conditions that needs to be met prior to discharge: Patient remains hospitalized for MRSA bacteremia treatment, await for ID input for further line and antibiotics recommendation, repeat blood culture negative from 3/12.  Hopefully home on  1-2 days once signed off by ID and surgery.   Consultants: Infectious disease, general surgery Procedures:  2D ECHO 1. Left ventricular ejection fraction, by estimation, is 55 to 60%. The  left ventricle has normal function. The left ventricle has no regional  wall motion abnormalities. Left ventricular diastolic parameters were  normal.    2. Right ventricular systolic function is normal. The right ventricular  size is normal.  3. The mitral valve is normal in structure. No evidence of mitral valve  regurgitation. No evidence of mitral stenosis.  4. The aortic valve is normal in structure. Aortic valve regurgitation is  not visualized. No aortic stenosis is present.   Microbiology: Blood culture with MRSA 05/21/19  Medications: Scheduled Meds: . amLODipine  5 mg Oral Daily  . chlorhexidine  15 mL Mouth Rinse BID  . Chlorhexidine Gluconate Cloth  6 each Topical Daily  . enoxaparin (LOVENOX) injection  40 mg Subcutaneous Q24H  . escitalopram  10 mg Oral Daily  . feeding supplement (ENSURE ENLIVE)  237 mL Oral TID BM  . feeding supplement (PRO-STAT SUGAR FREE 64)  30 mL Oral BID WC  . gabapentin  300 mg Oral BID  . insulin aspart  0-9 Units Subcutaneous Q6H  . mouth rinse  15 mL Mouth Rinse q12n4p  . Melatonin  3 mg Oral QHS  . metoprolol tartrate  50 mg Oral BID  . nystatin  5 mL Oral QID  . pantoprazole  40 mg Oral Daily  . simethicone  80 mg Oral QID   Continuous Infusions: . dextrose 5 % and 0.9% NaCl 30 mL/hr at 05/25/19 1227  . vancomycin Stopped (05/25/19 2338)    Antimicrobials: Anti-infectives (From admission, onward)   Start     Dose/Rate Route Frequency Ordered Stop   05/25/19 2200  vancomycin (VANCOCIN) IVPB 1000 mg/200 mL premix     1,000 mg 200 mL/hr over 60 Minutes Intravenous Every 24 hours 05/25/19 0958     05/24/19 2200  vancomycin (VANCOREADY) IVPB 750 mg/150 mL  Status:  Discontinued     750 mg 150 mL/hr over 60 Minutes Intravenous Every 24 hours 05/24/19 1156 05/25/19 0958   05/23/19 2200  vancomycin (VANCOCIN) IVPB 1000 mg/200 mL premix     1,000 mg 200 mL/hr over 60 Minutes Intravenous  Once 05/23/19 1345 05/24/19 0008   05/23/19 1113  vancomycin variable dose per unstable renal function (pharmacist dosing)  Status:  Discontinued      Does not apply See admin instructions 05/23/19  1114 05/24/19 1156   05/22/19 1500  vancomycin (VANCOREADY) IVPB 1250 mg/250 mL  Status:  Discontinued     1,250 mg 166.7 mL/hr over 90 Minutes Intravenous Every 24 hours 05/22/19 1429 05/23/19 1114       Objective: Vitals: Today's Vitals   05/25/19 0830 05/25/19 1657 05/25/19 2034 05/26/19 0412  BP: (!) 157/68 132/67 138/60 129/72  Pulse: 71 72 74 75  Resp: _0 Temp: 98.1 F (36.7 C)  98.4 F (36.9 C) 98.2 F (36.8 C)  TempSrc: Oral Oral    SpO2: 96% 94% 96% 97%  Weight:   54.6 kg   Height:      PainSc:   0-No pain     Intake/Output Summary (Last 24 hours) at 05/26/2019 0743 Last data filed at 05/26/2019 0600 Gross per 24 hour  Intake 1361.08 ml  Output 2140 ml  Net -778.92 ml   Filed Weights   05/22/19 2100 05/24/19 2007 05/25/19 2034  Weight: 54.6 kg 54.6 kg 54.6 kg   Weight change: 0 kg   Intake/Output from previous day: 03/14 0701 - 03/15 0700 In: 1361.1 [P.O.:730; I.V.:431.1; IV Piggyback:200] Out: 2140 [Urine:1175; Drains:20; Stool:945] Intake/Output this shift: No intake/output data recorded.  Examination:  General exam: AAO X3, ill frail and cachectic.   HEENT:Oral mucosa moist, Ear/Nose WNL grossly,dentition normal. Respiratory system: bilaterally clear,no use of accessory muscle, non tender. Cardiovascular system: S1 & S2 +, regular, No JVD. Gastrointestinal system: Abdomen soft, right lower quadrant drain is slightly pulled out mild greenish drainage +, enterocutaneous fistula present with costomy bag-with yellowish liquid.Foley catheter present.  Nervous System:Alert, awake, moving extremities and grossly nonfocal Extremities: No edema, distal peripheral pulses palpable.  Skin: No rashes,no icterus. MSK: BKA bilaterally with a stump with wounds.    Data Reviewed: I have personally reviewed following labs and imaging studies CBC: Recent Labs  Lab 05/21/19 1038 05/21/19 1038 05/22/19 1600 05/23/19 0534 05/24/19 0747 05/25/19 0632  05/26/19 0354  WBC 12.5*   < > 11.9* 9.7 8.3 8.4 8.1  NEUTROABS 9.5*  --   --   --   --   --   --   HGB 8.2*   < > 7.6* 7.1* 7.0* 7.1* 7.5*  HCT 26.0*   < > 24.1* 22.1* 22.1* 22.4* 24.1*  MCV 97.4   < > 94.5 94.0 94.4 94.1 96.4  PLT 172   < > 186 171 226 260 300   < > = values in this interval not displayed.   Basic Metabolic Panel: Recent Labs  Lab 05/22/19 1600 05/23/19 0534 05/24/19 0522 05/25/19 0632 05/26/19 0354  NA 135 137 135 134* 136  K 4.3 3.9 4.7 3.8 3.7  CL 101 102 106 105 108  CO2 25 23 19* 21* 17*  GLUCOSE 243* 109* 90 124* 139*  BUN 46* 45* 38* 24* 18  CREATININE 0.88 1.11* 1.01* 0.84 0.83  CALCIUM 8.6* 8.7* 8.0* 8.1* 8.3*   GFR: Estimated Creatinine Clearance: 58.4 mL/min (by C-G formula based on SCr of 0.83 mg/dL). Liver Function Tests: Recent Labs  Lab 05/22/19 1600 05/23/19 0534 05/24/19 0522 05/25/19 0632 05/26/19 0354  AST 42* 36 43* 21 19  ALT 56* 46* 35 26 23  ALKPHOS 181* 169* 150* 154* 152*  BILITOT 1.3* 1.5* 1.8* 1.0 0.8  PROT 7.0 6.8 5.9* 6.5 6.9  ALBUMIN 1.7* 1.6* 1.5* 1.5* 1.6*   No results for input(s): LIPASE, AMYLASE in the last 168 hours. No results for input(s): AMMONIA in the last 168 hours. Coagulation Profile: No results for input(s): INR, PROTIME in the last 168 hours. Cardiac Enzymes: No results for input(s): CKTOTAL, CKMB, CKMBINDEX, TROPONINI in the last 168 hours. BNP (last 3 results) No results for input(s): PROBNP in the last 8760 hours. HbA1C: No results for input(s): HGBA1C in the last 72 hours. CBG: Recent Labs  Lab 05/25/19 1140 05/25/19 1656 05/25/19 2035 05/26/19 0413 05/26/19 0726  GLUCAP 144* 156* 156* 133* 132*   Lipid Profile: No results for input(s): CHOL, HDL, LDLCALC, TRIG, CHOLHDL, LDLDIRECT in the last 72 hours. Thyroid Function Tests: No results for input(s): TSH, T4TOTAL, FREET4, T3FREE, THYROIDAB in the last 72 hours. Anemia Panel: Recent Labs    05/25/19 0632  VITAMINB12 750  FOLATE  22.6  FERRITIN 646*  TIBC 126*  IRON 22*  RETICCTPCT 1.5   Sepsis Labs: Recent Labs  Lab 05/21/19 1038 05/22/19 1633  LATICACIDVEN 1.5 1.2    Recent Results (from the past 240  hour(s))  Urine Culture     Status: Abnormal   Collection Time: 05/21/19 10:00 AM   Specimen: Urine, Catheterized  Result Value Ref Range Status   Specimen Description URINE, CATHETERIZED  Final   Special Requests   Final    NONE Performed at [facility], [address] [place], [place] [address]    Culture (A)  Final    >=100,000 COLONIES/mL PSEUDOMONAS AERUGINOSA 50,000 COLONIES/mL VANCOMYCIN RESISTANT ENTEROCOCCUS ISOLATED    Report Status 05/24/2019 FINAL  Final   Organism ID, Bacteria PSEUDOMONAS AERUGINOSA (A)  Final   Organism ID, Bacteria VANCOMYCIN RESISTANT ENTEROCOCCUS ISOLATED (A)  Final      Susceptibility   Pseudomonas aeruginosa - MIC*    CEFTAZIDIME 4 SENSITIVE Sensitive     CIPROFLOXACIN <=0.25 SENSITIVE Sensitive     GENTAMICIN <=1 SENSITIVE Sensitive     IMIPENEM 1 SENSITIVE Sensitive     PIP/TAZO <=4 SENSITIVE Sensitive     CEFEPIME 2 SENSITIVE Sensitive     * >=100,000 COLONIES/mL PSEUDOMONAS AERUGINOSA   Vancomycin resistant enterococcus isolated - MIC*    AMPICILLIN >=32 RESISTANT Resistant     NITROFURANTOIN 256 RESISTANT Resistant     VANCOMYCIN >=32 RESISTANT Resistant     LINEZOLID 2 SENSITIVE Sensitive     * 50,000 COLONIES/mL VANCOMYCIN RESISTANT ENTEROCOCCUS ISOLATED  Respiratory Panel by RT PCR (Flu A&B, Covid) - Nasopharyngeal Swab     Status: None   Collection Time: 05/21/19 10:03 AM   Specimen: Nasopharyngeal Swab  Result Value Ref Range Status   SARS Coronavirus 2 by RT PCR NEGATIVE NEGATIVE Final    Comment: (NOTE) SARS-CoV-2 target nucleic acids are NOT DETECTED. The SARS-CoV-2 RNA is generally detectable in upper respiratoy specimens during the acute phase of infection. The lowest concentration of SARS-CoV-2 viral copies this assay can detect  is 131 copies/mL. A negative result does not preclude SARS-Cov-2 infection and should not be used as the sole basis for treatment or other patient management decisions. A negative result may occur with  improper specimen collection/handling, submission of specimen other than nasopharyngeal swab, presence of viral mutation(s) within the areas targeted by this assay, and inadequate number of viral copies (<131 copies/mL). A negative result must be combined with clinical observations, patient history, and epidemiological information. The expected result is Negative. Fact Sheet for Patients:  PinkCheek.be Fact Sheet for Healthcare Providers:  GravelBags.it This test is not yet ap proved or cleared by the [place] FDA and  has been authorized for detection and/or diagnosis of SARS-CoV-2 by FDA under an Emergency Use Authorization (EUA). This EUA will remain  in effect (meaning this test can be used) for the duration of the COVID-19 declaration under Section 564(b)(1) of the Act, 21 U.S.C. section 360bbb-3(b)(1), unless the authorization is terminated or revoked sooner.    Influenza A by PCR NEGATIVE NEGATIVE Final   Influenza B by PCR NEGATIVE NEGATIVE Final    Comment: (NOTE) The Xpert Xpress SARS-CoV-2/FLU/RSV assay is intended as an aid in  the diagnosis of influenza from Nasopharyngeal swab specimens and  should not be used as a sole basis for treatment. Nasal washings and  aspirates are unacceptable for Xpert Xpress SARS-CoV-2/FLU/RSV  testing. Fact Sheet for Patients: PinkCheek.be Fact Sheet for Healthcare Providers: GravelBags.it This test is not yet approved or cleared by the [place] FDA and  has been authorized for detection and/or diagnosis of SARS-CoV-2 by  FDA under an Emergency Use Authorization (EUA). This EUA will remain  in effect (meaning this  test can be used) for the duration of the  Covid-19 declaration under Section 564(b)(1) of the Act, 21  U.S.C. section 360bbb-3(b)(1), unless the authorization is  terminated or revoked. Performed at [facility], [place] [address], [place], [place] [address]   Blood culture (routine x 2)     Status: Abnormal   Collection Time: 05/21/19 10:38 AM   Specimen: BLOOD RIGHT ARM  Result Value Ref Range Status   Specimen Description BLOOD RIGHT ARM  Final   Special Requests   Final    AEROBIC BOTTLE ONLY Blood Culture results may not be optimal due to an inadequate volume of blood received in culture bottles   Culture  Setup Time   Final    GRAM POSITIVE COCCI IN CLUSTERS AEROBIC BOTTLE ONLY CRITICAL RESULT CALLED TO, READ BACK BY AND VERIFIED WITH: Macky Lower RN, AT [phone] 05/22/19 BY Rush Landmark Performed at [facility], [place] [address] [place], [place] [address]    Culture METHICILLIN RESISTANT STAPHYLOCOCCUS AUREUS (A)  Final   Report Status 05/24/2019 FINAL  Final   Organism ID, Bacteria METHICILLIN RESISTANT STAPHYLOCOCCUS AUREUS  Final      Susceptibility   Methicillin resistant staphylococcus aureus - MIC*    CIPROFLOXACIN >=8 RESISTANT Resistant     ERYTHROMYCIN <=0.25 SENSITIVE Sensitive     GENTAMICIN <=0.5 SENSITIVE Sensitive     OXACILLIN >=4 RESISTANT Resistant     TETRACYCLINE <=1 SENSITIVE Sensitive     VANCOMYCIN 1 SENSITIVE Sensitive     TRIMETH/SULFA <=10 SENSITIVE Sensitive     CLINDAMYCIN <=0.25 SENSITIVE Sensitive     RIFAMPIN <=0.5 SENSITIVE Sensitive     Inducible Clindamycin NEGATIVE Sensitive     * METHICILLIN RESISTANT STAPHYLOCOCCUS AUREUS  Blood culture (routine x 2)     Status: Abnormal   Collection Time: 05/21/19 10:38 AM   Specimen: BLOOD RIGHT HAND  Result Value Ref Range Status   Specimen Description BLOOD RIGHT HAND  Final   Special Requests   Final    AEROBIC BOTTLE ONLY Blood Culture results may not be optimal due to an inadequate volume of  blood received in culture bottles   Culture  Setup Time   Final    AEROBIC BOTTLE ONLY GRAM POSITIVE COCCI IN CLUSTERS CRITICAL RESULT CALLED TO, READ BACK BY AND VERIFIED WITH: K NEAL RN 05/22/19 0137 JDW    Culture (A)  Final    STAPHYLOCOCCUS AUREUS SUSCEPTIBILITIES PERFORMED ON PREVIOUS CULTURE WITHIN THE LAST 5 DAYS. Performed at [facility], [place] [address] [place], [place] [address]    Report Status 05/24/2019 FINAL  Final  Blood Culture ID Panel (Reflexed)     Status: Abnormal   Collection Time: 05/21/19 10:38 AM  Result Value Ref Range Status   Enterococcus species NOT DETECTED NOT DETECTED Final   Listeria monocytogenes NOT DETECTED NOT DETECTED Final   Staphylococcus species DETECTED (A) NOT DETECTED Final    Comment: CRITICAL RESULT CALLED TO, READ BACK BY AND VERIFIED WITH: S. COBLE RN, AT [phone] 05/22/19 BY D. VANHOOK    Staphylococcus aureus (BCID) DETECTED (A) NOT DETECTED Final    Comment: Methicillin (oxacillin)-resistant Staphylococcus aureus (MRSA). MRSA is predictably resistant to beta-lactam antibiotics (except ceftaroline). Preferred therapy is vancomycin unless clinically contraindicated. Patient requires contact precautions if  hospitalized. CRITICAL RESULT CALLED TO, READ BACK BY AND VERIFIED WITH: Macky Lower RN, AT [phone] 05/22/19 BY D. VANHOOK    Methicillin resistance DETECTED (  A) NOT DETECTED Final    Comment: CRITICAL RESULT CALLED TO, READ BACK BY AND VERIFIED WITH: S. COBLE RN, AT [phone] 05/22/19 BY D. VANHOOK    Streptococcus species NOT DETECTED NOT DETECTED Final   Streptococcus agalactiae NOT DETECTED NOT DETECTED Final   Streptococcus pneumoniae NOT DETECTED NOT DETECTED Final   Streptococcus pyogenes NOT DETECTED NOT DETECTED Final   Acinetobacter baumannii NOT DETECTED NOT DETECTED Final   Enterobacteriaceae species NOT DETECTED NOT DETECTED Final   Enterobacter cloacae complex NOT DETECTED NOT DETECTED Final   Escherichia coli NOT DETECTED NOT  DETECTED Final   Klebsiella oxytoca NOT DETECTED NOT DETECTED Final   Klebsiella pneumoniae NOT DETECTED NOT DETECTED Final   Proteus species NOT DETECTED NOT DETECTED Final   Serratia marcescens NOT DETECTED NOT DETECTED Final   Haemophilus influenzae NOT DETECTED NOT DETECTED Final   Neisseria meningitidis NOT DETECTED NOT DETECTED Final   Pseudomonas aeruginosa NOT DETECTED NOT DETECTED Final   Candida albicans NOT DETECTED NOT DETECTED Final   Candida glabrata NOT DETECTED NOT DETECTED Final   Candida krusei NOT DETECTED NOT DETECTED Final   Candida parapsilosis NOT DETECTED NOT DETECTED Final   Candida tropicalis NOT DETECTED NOT DETECTED Final    Comment: Performed at [facility], [place]. [address], [place], [place] [address]  SARS CORONAVIRUS 2 (TAT 6-24 HRS) Nasopharyngeal Nasopharyngeal Swab     Status: None   Collection Time: 05/22/19  2:45 PM   Specimen: Nasopharyngeal Swab  Result Value Ref Range Status   SARS Coronavirus 2 NEGATIVE NEGATIVE Final    Comment: (NOTE) SARS-CoV-2 target nucleic acids are NOT DETECTED. The SARS-CoV-2 RNA is generally detectable in upper and lower respiratory specimens during the acute phase of infection. Negative results do not preclude SARS-CoV-2 infection, do not rule out co-infections with other pathogens, and should not be used as the sole basis for treatment or other patient management decisions. Negative results must be combined with clinical observations, patient history, and epidemiological information. The expected result is Negative. Fact Sheet for Patients: SugarRoll.be Fact Sheet for Healthcare Providers: https://www.woods-mathews.com/ This test is not yet approved or cleared by the [place] FDA and  has been authorized for detection and/or diagnosis of SARS-CoV-2 by FDA under an Emergency Use Authorization (EUA). This EUA will remain  in effect (meaning this test can be used)  for the duration of the COVID-19 declaration under Section 56 4(b)(1) of the Act, 21 U.S.C. section 360bbb-3(b)(1), unless the authorization is terminated or revoked sooner. Performed at [facility], [place] [address], [place], [place] [address]   Culture, blood (routine x 2)     Status: None (Preliminary result)   Collection Time: 05/23/19  5:34 AM   Specimen: BLOOD  Result Value Ref Range Status   Specimen Description BLOOD LEFT ARM  Final   Special Requests   Final    BOTTLES DRAWN AEROBIC AND ANAEROBIC Blood Culture results may not be optimal due to an excessive volume of blood received in culture bottles   Culture   Final    NO GROWTH 2 DAYS Performed at [facility], [place] [address] [place], [place] [address]    Report Status PENDING  Incomplete  Culture, blood (routine x 2)     Status: None (Preliminary result)   Collection Time: 05/23/19  5:42 AM   Specimen: BLOOD  Result Value Ref Range Status   Specimen Description BLOOD LEFT HAND  Final   Special Requests  Final    BOTTLES DRAWN AEROBIC AND ANAEROBIC Blood Culture adequate volume   Culture   Final    NO GROWTH 2 DAYS Performed at [facility], [place] [address] [place], [place] [address]    Report Status PENDING  Incomplete      Radiology Studies: DG Swallowing Func-Speech Pathology  Result Date: 05/24/2019 Objective Swallowing Evaluation: Type of Study: MBS-Modified Barium Swallow Study  Patient Details Name: Gina [name] MRN: [id number] Date of Birth: 04-Sep-1960 Today's Date: 05/24/2019 Time: SLP Start Time (ACUTE ONLY): 1035 -SLP Stop Time (ACUTE ONLY): 1043 SLP Time Calculation (min) (ACUTE ONLY): 8 min Past Medical History: Past Medical History: Diagnosis Date . Diabetes mellitus  . Hypertension  . Osteomyelitis of right foot ([place]) 02/22/2017 . Perforated gastric ulcer ([place])  . S/P bilateral BKA (below knee amputation) ([place])  . Schizophrenia ([place])  . Schizophrenia ([place])  . Septic arthritis of  interphalangeal joint of toe of left foot ([place]) 06/02/2016 . Stress incontinence 04/26/2017 Past Surgical History: Past Surgical History: Procedure Laterality Date . AMPUTATION Left 06/05/2016  Procedure: LEFT FOOT TRANSMETATARSAL AMPUTATION;  Surgeon: [name], MD;  Location: [facility];  Service: Orthopedics;  Laterality: Left; . AMPUTATION Right 11/11/2017  Procedure: AMPUTATION BELOW KNEE;  Surgeon: [name], MD;  Location: [place];  Service: Orthopedics;  Laterality: Right; . AMPUTATION Left 05/10/2018  Procedure: LEFT BELOW KNEE AMPUTATION;  Surgeon: [name], MD;  Location: [place];  Service: Orthopedics;  Laterality: Left; . AMPUTATION Left 12/28/2018  Procedure: AMPUTATION THIRD DIGIT;  Surgeon: Iran Planas, MD;  [facility];  Service: Orthopedics;  Laterality: Left; . AMPUTATION TOE Right 02/23/2017  Procedure: AMPUTATION RIGHT THIRD TOE;  Surgeon: [name], MD;  Location: [place];  Service: Orthopedics;  Laterality: Right; . I & D EXTREMITY Right 11/09/2017  Procedure: Debride Ulcer Right Heel;  Surgeon: [name], MD;  Location: [place];  Service: Orthopedics;  Laterality: Right; . I & D EXTREMITY Left 12/28/2018  Procedure: IRRIGATION AND DEBRIDEMENT EXTREMITY;  Surgeon: Iran Planas, MD;  [facility];  Service: Orthopedics;  Laterality: Left; . INCISION AND DRAINAGE OF WOUND Left 12/26/2018  Procedure: IRRIGATION AND DEBRIDEMENT LEFT HAND;  Surgeon: Iran Planas, MD;  [facility];  Service: Orthopedics;  Laterality: Left; . LAPAROTOMY N/A 02/02/2019  Procedure: EXPLORATORY LAPAROTOMY WITH OVERSEW OF DUODENAL ULCER;  Surgeon: Alphonsa Overall, MD;  [facility];  Service: General;  Laterality: N/A; . TUBAL LIGATION   HPI: Gina [name] is a 59 y.o. F with hx DM, HTN, bilateral BKA, schizophrenia, and recent perforated ulcer leading to septic shock, DKA, fungemia, and persistent peritoneal abscess who presents with malaise, found to have MRSA bacteremia. Patient admitted  in Nov 2020 for septic shock, found to have acute peritonitis. [facility], and anidulafungin for total 6 weeks. Based on chart review, patient was seen for dysphagia and participated in [facility] 03/2019 while at [facility]. However, full notes not available. She was then discharged from [facility] to [facility], where she has been for last three months.  Pt now admitted with MRSA. CXR 3/10: Bibasilar atelectasis or infiltrates, right greater than left. Small right pleural effusion.  No data recorded Assessment / Plan / Recommendation CHL IP CLINICAL IMPRESSIONS 05/24/2019 Clinical Impression Patient presensts with a largely normal, age appropriate, oropharyngeal swallow. Oral phase timely with swift oral transit of bolus. Swallow triggered mostly at the vallecula with one episode of  frank penetration of thin liquids which remained under the epiglottis post swallow however cleared spontaneously with subsequent swallows before reaching the level of the vocal cords. No additional episodes of penetration or aspiration despite being challenged with multiple consecutive straw sips of liquid. Note that patient unable to orally  transit pill with thin liquids, able with pureed solids, however no trouble reported at bedside with pills by patient or RN. Recommend initiation of regular texture solids, thin liquid (per MD, "diet as tolerated"). SLP will f/u briefly for tolerance.  SLP Visit Diagnosis Dysphagia, unspecified (R13.10) Attention and concentration deficit following -- Frontal lobe and executive function deficit following -- Impact on safety and function No limitations   CHL IP TREATMENT RECOMMENDATION 05/24/2019 Treatment Recommendations Therapy as outlined in treatment plan below   Prognosis 05/23/2019 Prognosis for Safe Diet Advancement Fair Barriers to Reach Goals Severity of deficits;Time post onset Barriers/Prognosis Comment -- CHL IP DIET RECOMMENDATION 05/24/2019 SLP  Diet Recommendations Regular solids;Thin liquid Liquid Administration via Cup;Straw Medication Administration Whole meds with liquid Compensations Slow rate;Small sips/bites Postural Changes Seated upright at 90 degrees   CHL IP OTHER RECOMMENDATIONS 05/24/2019 Recommended Consults -- Oral Care Recommendations Oral care BID Other Recommendations --   CHL IP FOLLOW UP RECOMMENDATIONS 05/24/2019 Follow up Recommendations None   CHL IP FREQUENCY AND DURATION 05/24/2019 Speech Therapy Frequency (ACUTE ONLY) min 1 x/week Treatment Duration 1 week      CHL IP ORAL PHASE 05/24/2019 Oral Phase WFL Oral - Pudding Teaspoon -- Oral - Pudding Cup -- Oral - Honey Teaspoon -- Oral - Honey Cup -- Oral - Nectar Teaspoon -- Oral - Nectar Cup -- Oral - Nectar Straw -- Oral - Thin Teaspoon -- Oral - Thin Cup -- Oral - Thin Straw -- Oral - Puree -- Oral - Mech Soft -- Oral - Regular -- Oral - Multi-Consistency -- Oral - Pill -- Oral Phase - Comment --  CHL IP PHARYNGEAL PHASE 05/24/2019 Pharyngeal Phase WFL Pharyngeal- Pudding Teaspoon -- Pharyngeal -- Pharyngeal- Pudding Cup -- Pharyngeal -- Pharyngeal- Honey Teaspoon -- Pharyngeal -- Pharyngeal- Honey Cup -- Pharyngeal -- Pharyngeal- Nectar Teaspoon -- Pharyngeal -- Pharyngeal- Nectar Cup -- Pharyngeal -- Pharyngeal- Nectar Straw -- Pharyngeal -- Pharyngeal- Thin Teaspoon -- Pharyngeal -- Pharyngeal- Thin Cup -- Pharyngeal -- Pharyngeal- Thin Straw -- Pharyngeal -- Pharyngeal- Puree -- Pharyngeal -- Pharyngeal- Mechanical Soft -- Pharyngeal -- Pharyngeal- Regular -- Pharyngeal -- Pharyngeal- Multi-consistency -- Pharyngeal -- Pharyngeal- Pill -- Pharyngeal -- Pharyngeal Comment --  CHL IP CERVICAL ESOPHAGEAL PHASE 05/24/2019 Cervical Esophageal Phase WFL Pudding Teaspoon -- Pudding Cup -- Honey Teaspoon -- Honey Cup -- Nectar Teaspoon -- Nectar Cup -- Nectar Straw -- Thin Teaspoon -- Thin Cup -- Thin Straw -- Puree -- Mechanical Soft -- Regular -- Multi-consistency -- Pill -- Cervical  Esophageal Comment -- Leah McCoy MA, CCC-SLP McCoy Leah Meryl 05/24/2019, 11:11 AM               LOS: 4 days  Time spent: More than 50% of that time was spent in counseling and/or coordination of care. Antonieta Pert, MD [facility]  05/26/2019, 7:43 AM

## 2019-05-26 NOTE — Telephone Encounter (Signed)
Pt with + [facility] 05/21/19 Currently adm and being followed by ID Per Wyn Quaker PA

## 2019-05-26 NOTE — Progress Notes (Signed)
  Speech Language Pathology Treatment: Dysphagia  Patient Details Name: Gina [name] MRN: [id number] DOB: [birth year] Today's Date: 05/26/2019 Time: 7034-0352 SLP Time Calculation (min) (ACUTE ONLY): 24 min  Assessment / Plan / Recommendation Clinical Impression  F/u diet tolerance assessment revealed good toleration of current po diet. Vital signs all remaining WFL without evidence of aspiration from a pulmonary standpoint. Skilled observation with am meal complete. Patient able to consume prescribed diet with no overt indication of aspiration, normal oropharyngeal swallow, as also indicated on MBS complete 3/13. No further SLP needs required at this time. Patient choosing diet textures she can efficiently masticate independently.    HPI HPI: Gina [name] is a 59 y.o. F with hx DM, HTN, bilateral BKA, schizophrenia, and recent perforated ulcer leading to septic shock, DKA, fungemia, and persistent peritoneal abscess who presents with malaise, found to have MRSA bacteremia. Patient admitted in Nov 2020 for septic shock, found to have acute peritonitis. [facility] B[facility], and anidulafungin for total 6 weeks. Based on chart review, patient was seen for dysphagia and participated in [facility] 03/2019 while at [facility]. However, full notes not available. She was then discharged from [facility] to [facility], where she has been for last three months.  Pt now admitted with MRSA. CXR 3/10: Bibasilar atelectasis or infiltrates, right greater than left. Small right pleural effusion.      SLP Plan  All goals met;Discharge SLP treatment due to (comment)       Recommendations  Diet recommendations: Regular;Thin liquid Liquids provided via: Cup;Straw Medication Administration: Whole meds with liquid Supervision: Patient able to self feed Compensations: Slow rate;Small sips/bites Postural Changes and/or Swallow Maneuvers: Seated upright 90 degrees                 Oral Care Recommendations: Oral care BID Follow up Recommendations: None SLP Visit Diagnosis: Dysphagia, unspecified (R13.10) Plan: All goals met;Discharge SLP treatment due to (comment)                   Gabriel Rainwater MA, CCC-SLP     Benard Minturn Meryl 05/26/2019, 9:01 AM

## 2019-05-26 NOTE — Plan of Care (Signed)
  Problem: Fluid Volume: Goal: Hemodynamic stability will improve Outcome: Progressing   

## 2019-05-26 NOTE — Progress Notes (Signed)
[facility] Progress Note     Subjective: CC-  Eating pancakes for breakfast. Denies abdominal pain, nausea, vomiting. States that she started having copious diarrhea over night. 945cc out from ECF last 24hr.  Objective: Vital signs in last 24 hours: Temp:  [98.1 F (36.7 C)-98.4 F (36.9 C)] 98.2 F (36.8 C) (03/15 0412) Pulse Rate:  [71-75] 75 (03/15 0412) Resp:  [15-18] 16 (03/15 0412) BP: (129-157)/(60-72) 129/72 (03/15 0412) SpO2:  [94 %-97 %] 97 % (03/15 0412) Weight:  [54.6 kg] 54.6 kg (03/14 2034) Last BM Date: 05/26/19  Intake/Output from previous day: 03/14 0701 - 03/15 0700 In: 1361.1 [P.O.:730; I.V.:431.1; IV Piggyback:200] Out: 2140 [Urine:1175; Drains:20; Stool:945] Intake/Output this shift: No intake/output data recorded.  PE: Gen:  Alert, NAD, pleasant Pulm:  rate and effort normal Abd: soft, nontender, nondistended, [place] in place over fistula with GI contents in bag, IR drain also with enteric contents Ext:  S/p BKA Skin: warm and dry  Lab Results:  Recent Labs    05/25/19 0632 05/26/19 0354  WBC 8.4 8.1  HGB 7.1* 7.5*  HCT 22.4* 24.1*  PLT 260 300   BMET Recent Labs    05/25/19 0632 05/26/19 0354  NA 134* 136  K 3.8 3.7  CL 105 108  CO2 21* 17*  GLUCOSE 124* 139*  BUN 24* 18  CREATININE 0.84 0.83  CALCIUM 8.1* 8.3*   PT/INR No results for input(s): LABPROT, INR in the last 72 hours. CMP     Component Value Date/Time   NA 136 05/26/2019 0354   NA 139 10/04/2017 1705   K 3.7 05/26/2019 0354   CL 108 05/26/2019 0354   CO2 17 (L) 05/26/2019 0354   GLUCOSE 139 (H) 05/26/2019 0354   BUN 18 05/26/2019 0354   BUN 11 10/04/2017 1705   CREATININE 0.83 05/26/2019 0354   CREATININE 0.68 04/28/2016 1640   CALCIUM 8.3 (L) 05/26/2019 0354   PROT 6.9 05/26/2019 0354   PROT 7.1 10/04/2017 1705   ALBUMIN 1.6 (L) 05/26/2019 0354   ALBUMIN 3.7 10/04/2017 1705   AST 19 05/26/2019 0354   ALT 23 05/26/2019 0354   ALKPHOS 152  (H) 05/26/2019 0354   BILITOT 0.8 05/26/2019 0354   BILITOT <0.2 10/04/2017 1705   GFRNONAA >60 05/26/2019 0354   GFRNONAA >89 04/28/2016 1640   GFRAA >60 05/26/2019 0354   GFRAA >89 04/28/2016 1640   Lipase     Component Value Date/Time   LIPASE 27 04/01/2016 1040       Studies/Results: DG Swallowing Func-Speech Pathology  Result Date: 05/24/2019 Objective Swallowing Evaluation: Type of Study: MBS-Modified Barium Swallow Study  Patient Details Name: Gina [name] MRN: [id number] Date of Birth: Mar 26, 1960 Today's Date: 05/24/2019 Time: SLP Start Time (ACUTE ONLY): 1035 -SLP Stop Time (ACUTE ONLY): 1043 SLP Time Calculation (min) (ACUTE ONLY): 8 min Past Medical History: Past Medical History: Diagnosis Date . Diabetes mellitus  . Hypertension  . Osteomyelitis of right foot ([place]) 02/22/2017 . Perforated gastric ulcer ([place])  . S/P bilateral BKA (below knee amputation) ([place])  . Schizophrenia ([place])  . Schizophrenia ([place])  . Septic arthritis of interphalangeal joint of toe of left foot ([place]) 06/02/2016 . Stress incontinence 04/26/2017 Past Surgical History: Past Surgical History: Procedure Laterality Date . AMPUTATION Left 06/05/2016  Procedure: LEFT FOOT TRANSMETATARSAL AMPUTATION;  Surgeon: [name], MD;  [facility];  Service: Orthopedics;  Laterality: Left; . AMPUTATION Right 11/11/2017  Procedure: AMPUTATION BELOW KNEE;  Surgeon: [name],  MD;  Location: [place];  Service: Orthopedics;  Laterality: Right; . AMPUTATION Left 05/10/2018  Procedure: LEFT BELOW KNEE AMPUTATION;  Surgeon: [name], MD;  Location: [place];  Service: Orthopedics;  Laterality: Left; . AMPUTATION Left 12/28/2018  Procedure: AMPUTATION THIRD DIGIT;  Surgeon: Iran Planas, MD;  [facility];  Service: Orthopedics;  Laterality: Left; . AMPUTATION TOE Right 02/23/2017  Procedure: AMPUTATION RIGHT THIRD TOE;  Surgeon: [name], MD;  Location: [place];  Service: Orthopedics;  Laterality: Right; . I & D  EXTREMITY Right 11/09/2017  Procedure: Debride Ulcer Right Heel;  Surgeon: [name], MD;  Location: [place];  Service: Orthopedics;  Laterality: Right; . I & D EXTREMITY Left 12/28/2018  Procedure: IRRIGATION AND DEBRIDEMENT EXTREMITY;  Surgeon: Iran Planas, MD;  [facility];  Service: Orthopedics;  Laterality: Left; . INCISION AND DRAINAGE OF WOUND Left 12/26/2018  Procedure: IRRIGATION AND DEBRIDEMENT LEFT HAND;  Surgeon: Iran Planas, MD;  [facility];  Service: Orthopedics;  Laterality: Left; . LAPAROTOMY N/A 02/02/2019  Procedure: EXPLORATORY LAPAROTOMY WITH OVERSEW OF DUODENAL ULCER;  Surgeon: Alphonsa Overall, MD;  [facility];  Service: General;  Laterality: N/A; . TUBAL LIGATION   HPI: Gina [name] is a 59 y.o. F with hx DM, HTN, bilateral BKA, schizophrenia, and recent perforated ulcer leading to septic shock, DKA, fungemia, and persistent peritoneal abscess who presents with malaise, found to have MRSA bacteremia. Patient admitted in Nov 2020 for septic shock, found to have acute peritonitis. [facility], and anidulafungin for total 6 weeks. Based on chart review, patient was seen for dysphagia and participated in [facility] 03/2019 while at [facility]. However, full notes not available. She was then discharged from [facility] to [facility], where she has been for last three months.  Pt now admitted with MRSA. CXR 3/10: Bibasilar atelectasis or infiltrates, right greater than left. Small right pleural effusion.  No data recorded Assessment / Plan / Recommendation CHL IP CLINICAL IMPRESSIONS 05/24/2019 Clinical Impression Patient presensts with a largely normal, age appropriate, oropharyngeal swallow. Oral phase timely with swift oral transit of bolus. Swallow triggered mostly at the vallecula with one episode of frank penetration of thin liquids which remained under the epiglottis post swallow however cleared spontaneously with subsequent  swallows before reaching the level of the vocal cords. No additional episodes of penetration or aspiration despite being challenged with multiple consecutive straw sips of liquid. Note that patient unable to orally  transit pill with thin liquids, able with pureed solids, however no trouble reported at bedside with pills by patient or RN. Recommend initiation of regular texture solids, thin liquid (per MD, "diet as tolerated"). SLP will f/u briefly for tolerance.  SLP Visit Diagnosis Dysphagia, unspecified (R13.10) Attention and concentration deficit following -- Frontal lobe and executive function deficit following -- Impact on safety and function No limitations   CHL IP TREATMENT RECOMMENDATION 05/24/2019 Treatment Recommendations Therapy as outlined in treatment plan below   Prognosis 05/23/2019 Prognosis for Safe Diet Advancement Fair Barriers to Reach Goals Severity of deficits;Time post onset Barriers/Prognosis Comment -- CHL IP DIET RECOMMENDATION 05/24/2019 SLP Diet Recommendations Regular solids;Thin liquid Liquid Administration via Cup;Straw Medication Administration Whole meds with liquid Compensations Slow rate;Small sips/bites Postural Changes Seated upright at 90 degrees   CHL IP OTHER RECOMMENDATIONS 05/24/2019 Recommended Consults -- Oral Care Recommendations Oral care BID Other Recommendations --   CHL IP FOLLOW UP RECOMMENDATIONS 05/24/2019 Follow up Recommendations None  CHL IP FREQUENCY AND DURATION 05/24/2019 Speech Therapy Frequency (ACUTE ONLY) min 1 x/week Treatment Duration 1 week      CHL IP ORAL PHASE 05/24/2019 Oral Phase WFL Oral - Pudding Teaspoon -- Oral - Pudding Cup -- Oral - Honey Teaspoon -- Oral - Honey Cup -- Oral - Nectar Teaspoon -- Oral - Nectar Cup -- Oral - Nectar Straw -- Oral - Thin Teaspoon -- Oral - Thin Cup -- Oral - Thin Straw -- Oral - Puree -- Oral - Mech Soft -- Oral - Regular -- Oral - Multi-Consistency -- Oral - Pill -- Oral Phase - Comment --  CHL IP PHARYNGEAL PHASE  05/24/2019 Pharyngeal Phase WFL Pharyngeal- Pudding Teaspoon -- Pharyngeal -- Pharyngeal- Pudding Cup -- Pharyngeal -- Pharyngeal- Honey Teaspoon -- Pharyngeal -- Pharyngeal- Honey Cup -- Pharyngeal -- Pharyngeal- Nectar Teaspoon -- Pharyngeal -- Pharyngeal- Nectar Cup -- Pharyngeal -- Pharyngeal- Nectar Straw -- Pharyngeal -- Pharyngeal- Thin Teaspoon -- Pharyngeal -- Pharyngeal- Thin Cup -- Pharyngeal -- Pharyngeal- Thin Straw -- Pharyngeal -- Pharyngeal- Puree -- Pharyngeal -- Pharyngeal- Mechanical Soft -- Pharyngeal -- Pharyngeal- Regular -- Pharyngeal -- Pharyngeal- Multi-consistency -- Pharyngeal -- Pharyngeal- Pill -- Pharyngeal -- Pharyngeal Comment --  CHL IP CERVICAL ESOPHAGEAL PHASE 05/24/2019 Cervical Esophageal Phase WFL Pudding Teaspoon -- Pudding Cup -- Honey Teaspoon -- Honey Cup -- Nectar Teaspoon -- Nectar Cup -- Nectar Straw -- Thin Teaspoon -- Thin Cup -- Thin Straw -- Puree -- Mechanical Soft -- Regular -- Multi-consistency -- Pill -- Cervical Esophageal Comment -- Gabriel Rainwater MA, CCC-SLP McCoy Leah Meryl 05/24/2019, 11:11 AM               Anti-infectives: Anti-infectives (From admission, onward)   Start     Dose/Rate Route Frequency Ordered Stop   05/25/19 2200  vancomycin (VANCOCIN) IVPB 1000 mg/200 mL premix     1,000 mg 200 mL/hr over 60 Minutes Intravenous Every 24 hours 05/25/19 0958     05/24/19 2200  vancomycin (VANCOREADY) IVPB 750 mg/150 mL  Status:  Discontinued     750 mg 150 mL/hr over 60 Minutes Intravenous Every 24 hours 05/24/19 1156 05/25/19 0958   05/23/19 2200  vancomycin (VANCOCIN) IVPB 1000 mg/200 mL premix     1,000 mg 200 mL/hr over 60 Minutes Intravenous  Once 05/23/19 1345 05/24/19 0008   05/23/19 1113  vancomycin variable dose per unstable renal function (pharmacist dosing)  Status:  Discontinued      Does not apply See admin instructions 05/23/19 1114 05/24/19 1156   05/22/19 1500  vancomycin (VANCOREADY) IVPB 1250 mg/250 mL  Status:  Discontinued      1,250 mg 166.7 mL/hr over 90 Minutes Intravenous Every 24 hours 05/22/19 1429 05/23/19 1114       Assessment/Plan HTN DM Schizophrenia Stress urinary incontinence Bilateral BKAs Left middle finger amputation  Severe malnutrition - prealbumin 7.1 (3/13) Code status DNR  MRSA bacteremia - per primary, PICC line out, on vancomycin  Perforated prepyloric ulcer S/pEXPLORATORY LAPAROTOMY WITH OVERSEW OF DUODENAL ULCER with [name] patch11/23/2020 Dr. 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 Will ask dietician to see for calorie count, and to assist with maximizing enteral nutrition.    LOS: 4 days    [facility] 05/26/2019, 8:08 AM Please see Amion for pager number during day hours 7:00am-4:30pm

## 2019-05-26 NOTE — Progress Notes (Signed)
Changed patients dressings. Tolerated well.   Farley Ly RN

## 2019-05-27 ENCOUNTER — Inpatient Hospital Stay (HOSPITAL_COMMUNITY): Payer: Medicare Other | Admitting: Critical Care Medicine

## 2019-05-27 ENCOUNTER — Encounter (HOSPITAL_COMMUNITY)
Admission: EM | Disposition: A | Discharge status: Home / Self Care | Payer: Self-pay | Source: Home / Self Care | Attending: Internal Medicine

## 2019-05-27 ENCOUNTER — Inpatient Hospital Stay (HOSPITAL_COMMUNITY): Payer: Medicare Other

## 2019-05-27 ENCOUNTER — Encounter (HOSPITAL_COMMUNITY): Payer: Self-pay | Admitting: Family Medicine

## 2019-05-27 DIAGNOSIS — R7881 Bacteremia: Secondary | ICD-10-CM

## 2019-05-27 DIAGNOSIS — Z89512 Acquired absence of left leg below knee: Secondary | ICD-10-CM

## 2019-05-27 DIAGNOSIS — R109 Unspecified abdominal pain: Secondary | ICD-10-CM

## 2019-05-27 DIAGNOSIS — Z91030 Bee allergy status: Secondary | ICD-10-CM

## 2019-05-27 DIAGNOSIS — Z888 Allergy status to other drugs, medicaments and biological substances status: Secondary | ICD-10-CM

## 2019-05-27 DIAGNOSIS — Z89511 Acquired absence of right leg below knee: Secondary | ICD-10-CM

## 2019-05-27 DIAGNOSIS — I340 Nonrheumatic mitral (valve) insufficiency: Secondary | ICD-10-CM

## 2019-05-27 HISTORY — PX: TEE WITHOUT CARDIOVERSION: SHX5443

## 2019-05-27 LAB — COMPREHENSIVE METABOLIC PANEL
ALT: 21 U/L (ref 0–44)
AST: 18 U/L (ref 15–41)
Albumin: 1.5 g/dL — ABNORMAL LOW (ref 3.5–5.0)
Alkaline Phosphatase: 135 U/L — ABNORMAL HIGH (ref 38–126)
Anion gap: 8 (ref 5–15)
BUN: 14 mg/dL (ref 6–20)
CO2: 19 mmol/L — ABNORMAL LOW (ref 22–32)
Calcium: 8.2 mg/dL — ABNORMAL LOW (ref 8.9–10.3)
Chloride: 109 mmol/L (ref 98–111)
Creatinine, Ser: 0.71 mg/dL (ref 0.44–1.00)
GFR calc Af Amer: >60 mL/min (ref >60)
GFR calc non Af Amer: >60 mL/min (ref >60)
Glucose, Bld: 82 mg/dL (ref 70–99)
Potassium: 3.8 mmol/L (ref 3.5–5.1)
Sodium: 136 mmol/L (ref 135–145)
Total Bilirubin: 0.5 mg/dL (ref 0.3–1.2)
Total Protein: 6.4 g/dL — ABNORMAL LOW (ref 6.5–8.1)

## 2019-05-27 LAB — GLUCOSE, CAPILLARY
Glucose-Capillary: 76 mg/dL (ref 70–99)
Glucose-Capillary: 83 mg/dL (ref 70–99)
Glucose-Capillary: 82 mg/dL (ref 70–99)
Glucose-Capillary: 79 mg/dL (ref 70–99)
Glucose-Capillary: 78 mg/dL (ref 70–99)

## 2019-05-27 LAB — CBC
HCT: 23.8 % — ABNORMAL LOW (ref 36.0–46.0)
Hemoglobin: 7.5 g/dL — ABNORMAL LOW (ref 12.0–15.0)
MCH: 30.5 pg (ref 26.0–34.0)
MCHC: 31.5 g/dL (ref 30.0–36.0)
MCV: 96.7 fL (ref 80.0–100.0)
Platelets: 344 10*3/uL (ref 150–400)
RBC: 2.46 MIL/uL — ABNORMAL LOW (ref 3.87–5.11)
RDW: 14.7 % (ref 11.5–15.5)
WBC: 7.7 10*3/uL (ref 4.0–10.5)
nRBC: 0 % (ref 0.0–0.2)

## 2019-05-27 SURGERY — ECHOCARDIOGRAM, TRANSESOPHAGEAL
Anesthesia: Monitor Anesthesia Care

## 2019-05-27 MED ORDER — BUTAMBEN-TETRACAINE-BENZOCAINE 2-2-14 % EX AERO
INHALATION_SPRAY | CUTANEOUS | Status: DC | PRN
  Administered 2019-05-27: 2 via TOPICAL

## 2019-05-27 MED ORDER — PROPOFOL 10 MG/ML IV BOLUS
INTRAVENOUS | Status: DC | PRN
  Administered 2019-05-27: 125 ug/kg/min via INTRAVENOUS

## 2019-05-27 MED ORDER — PROPOFOL 500 MG/50ML IV EMUL
INTRAVENOUS | Status: DC | PRN
  Administered 2019-05-27: 10 mg via INTRAVENOUS

## 2019-05-27 NOTE — CV Procedure (Signed)
Brief TEE Note  LVEF 55-60% Mild MR, mild TR, trivial PR No evidence of endocarditis No LA/LAA thrombus or masses  For additional details see full report.  Gina [name] C. Gina Linsey, MD, [facility] 05/27/2019 1:01 PM

## 2019-05-27 NOTE — Progress Notes (Addendum)
Occupational Therapy Treatment Patient Details Name: Gina [name] MRN: [id number] DOB: November 07, 1960 Today's Date: 05/27/2019    History of present illness Pt is a 59 y/o female admitted from SNF secondary to abnormal lab values. Found to have MRSA bacteremia. Pt also with abdominal fistula and multiple abdominal drains secondary to previous complicated hospital admission. PMH includes HTN, DM, bilateral BKA, and schizophrenia.    OT comments  Pt agreeable to bed level activity, declined sitting EOB. Pt rolled in bed max A for pericare/hygiene, grooming and UB dressing at bed level. Pt going for TEE. OT will continue to follow acutely  Follow Up Recommendations  SNF;Supervision/Assistance - 24 hour    Equipment Recommendations  Wheelchair (measurements OT);Wheelchair cushion (measurements OT);Hospital bed    Recommendations for Other Services      Precautions / Restrictions Precautions Precautions: Fall;Other (comment) Precaution Comments: bilateral BKA  Restrictions Weight Bearing Restrictions: No       Mobility Bed Mobility Overal bed mobility: Needs Assistance Bed Mobility: Rolling Rolling: Max assist         General bed mobility comments: rolling to L and R for pericare and hygiene, changing of bed linens  Transfers                 General transfer comment: Dependent at baseline     Balance                                           ADL either performed or assessed with clinical judgement   ADL Overall ADL's : Needs assistance/impaired     Grooming: Wash/dry hands;Wash/dry face;Set up;Supervision/safety;Bed level           Upper Body Dressing : Set up;Supervision/safety;Bed level           Toileting- Clothing Manipulation and Hygiene: Total assistance;Bed level               Vision Patient Visual Report: No change from baseline     Perception     Praxis      Cognition Arousal/Alertness: Awake/alert Behavior  During Therapy: WFL for tasks assessed/performed Overall Cognitive Status: No family/caregiver present to determine baseline cognitive functioning                                 General Comments: waxing/waning cognition with periods of confusion        Exercises     Shoulder Instructions       General Comments      Pertinent Vitals/ Pain       Pain Assessment: Faces Faces Pain Scale: Hurts even more Pain Location: abdomen Pain Descriptors / Indicators: Grimacing;Guarding Pain Intervention(s): Limited activity within patient's tolerance;Monitored during session;Repositioned  Home Living Family/patient expects to be discharged to:: Skilled nursing facility                                        Prior Functioning/Environment Level of Independence: Needs assistance  Gait / Transfers Assistance Needed: Requires hoyer lift for transfers.  ADL's / Homemaking Assistance Needed: Requires assist with bathing/dressing at SNF        Frequency  Min 2X/week        Progress Toward Goals  OT Goals(current goals can  now be found in the care plan section)  Progress towards OT goals: OT to reassess next treatment  Acute Rehab OT Goals Patient Stated Goal: wants to go home  Plan      Co-evaluation                 AM-PAC OT "6 Clicks" Daily Activity     Outcome Measure   Help from another person eating meals?: Total Help from another person taking care of personal grooming?: A Lot Help from another person toileting, which includes using toliet, bedpan, or urinal?: Total Help from another person bathing (including washing, rinsing, drying)?: A Lot Help from another person to put on and taking off regular upper body clothing?: A Lot Help from another person to put on and taking off regular lower body clothing?: Total 6 Click Score: 9    End of Session    OT Visit Diagnosis: Muscle weakness (generalized) (M62.81);Pain;Other  abnormalities of gait and mobility (R26.89) Pain - part of body: (abdomen)   Activity Tolerance Patient limited by pain   Patient Left in bed;with call bell/phone within reach;with nursing/sitter in room   Nurse Communication          Time: 1007-1219 OT Time Calculation (min): 19 min  Charges: OT General Charges $OT Visit: 1 Visit OT Treatments $Self Care/Home Management : 8-22 mins     Britt Bottom 05/27/2019, 1:05 PM

## 2019-05-27 NOTE — Anesthesia Preprocedure Evaluation (Addendum)
Anesthesia Evaluation  Patient identified by MRN, date of birth, ID band Patient awake    Reviewed: Allergy & Precautions, NPO status , Patient's Chart, lab work & pertinent test results  History of Anesthesia Complications Negative for: history of anesthetic complications  Airway Mallampati: III  TM Distance: >3 FB Neck ROM: Full  Mouth opening: Limited Mouth Opening  Dental  (+) Missing, Poor Dentition   Pulmonary neg pulmonary ROS,    Pulmonary exam normal        Cardiovascular hypertension, + Peripheral Vascular Disease  Normal cardiovascular exam     Neuro/Psych PSYCHIATRIC DISORDERS Depression Schizophrenia negative neurological ROS     GI/Hepatic Neg liver ROS, PUD,   Endo/Other  diabetes  Renal/GU negative Renal ROS  negative genitourinary   Musculoskeletal negative musculoskeletal ROS (+)   Abdominal   Peds  Hematology  (+) anemia ,   Anesthesia Other Findings TEE for bacteremia  Reproductive/Obstetrics                            Anesthesia Physical Anesthesia Plan  ASA: III  Anesthesia Plan: MAC   Post-op Pain Management:    Induction: Intravenous  PONV Risk Score and Plan: 2 and Propofol infusion, TIVA and Treatment may vary due to age or medical condition  Airway Management Planned: Natural Airway, Nasal Cannula and Simple Face Mask  Additional Equipment: None  Intra-op Plan:   Post-operative Plan:   Informed Consent: I have reviewed the patients History and Physical, chart, labs and discussed the procedure including the risks, benefits and alternatives for the proposed anesthesia with the patient or authorized representative who has indicated his/her understanding and acceptance.   Patient has DNR.  Discussed DNR with patient and Suspend DNR.     Plan Discussed with:   Anesthesia Plan Comments:         Anesthesia Quick Evaluation

## 2019-05-27 NOTE — Progress Notes (Signed)
  Echocardiogram Echocardiogram Transesophageal has been performed.  Gina [name] [name] 05/27/2019, 1:14 PM

## 2019-05-27 NOTE — Plan of Care (Signed)
  Problem: Clinical Measurements: Goal: Diagnostic test results will improve Outcome: Progressing   

## 2019-05-27 NOTE — Progress Notes (Signed)
[facility] Progress Note     Subjective: CC-  States that she is having some abdominal pain and general worsening feeling this morning since new IV was placed last night. Denies n/v. She does feel hungry. NPO for TEE today. She has not had any more diarrhea. ECF with 400cc output last 24hr. WBC 7.7, TMAX 99.3.  Objective: Vital signs in last 24 hours: Temp:  [98 F (36.7 C)-99.3 F (37.4 C)] 98.5 F (36.9 C) (03/16 0409) Pulse Rate:  [68-78] 68 (03/16 0409) Resp:  [15-20] 15 (03/16 0409) BP: (139-156)/(71-90) 156/74 (03/16 0409) SpO2:  [95 %-99 %] 98 % (03/16 0409) Last BM Date: 05/26/19  Intake/Output from previous day: 03/15 0701 - 03/16 0700 In: 1182.7 [P.O.:840; I.V.:142.7; IV Piggyback:200] Out: 700 [Urine:300; Stool:400] Intake/Output this shift: No intake/output data recorded.  PE: Gen: Alert, NAD, pleasant Pulm: rate and effort normal EYC:XKGY, nontender, nondistended, Eakins in place over fistula with GI contents in bag, IR drain also with enteric contents, previous drain site RUQ with no active drainage or erythema/ enteric contents noted on dressing Ext:S/p BKA Skin: warm and dry  Lab Results:  Recent Labs    05/26/19 0354 05/27/19 0429  WBC 8.1 7.7  HGB 7.5* 7.5*  HCT 24.1* 23.8*  PLT 300 344   BMET Recent Labs    05/26/19 0354 05/27/19 0429  NA 136 136  K 3.7 3.8  CL 108 109  CO2 17* 19*  GLUCOSE 139* 82  BUN 18 14  CREATININE 0.83 0.71  CALCIUM 8.3* 8.2*   PT/INR No results for input(s): LABPROT, INR in the last 72 hours. CMP     Component Value Date/Time   NA 136 05/27/2019 0429   NA 139 10/04/2017 1705   K 3.8 05/27/2019 0429   CL 109 05/27/2019 0429   CO2 19 (L) 05/27/2019 0429   GLUCOSE 82 05/27/2019 0429   BUN 14 05/27/2019 0429   BUN 11 10/04/2017 1705   CREATININE 0.71 05/27/2019 0429   CREATININE 0.68 04/28/2016 1640   CALCIUM 8.2 (L) 05/27/2019 0429   PROT 6.4 (L) 05/27/2019 0429   PROT 7.1  10/04/2017 1705   ALBUMIN 1.5 (L) 05/27/2019 0429   ALBUMIN 3.7 10/04/2017 1705   AST 18 05/27/2019 0429   ALT 21 05/27/2019 0429   ALKPHOS 135 (H) 05/27/2019 0429   BILITOT 0.5 05/27/2019 0429   BILITOT <0.2 10/04/2017 1705   GFRNONAA >60 05/27/2019 0429   GFRNONAA >89 04/28/2016 1640   GFRAA >60 05/27/2019 0429   GFRAA >89 04/28/2016 1640   Lipase     Component Value Date/Time   LIPASE 27 04/01/2016 1040       Studies/Results: No results found.  Anti-infectives: Anti-infectives (From admission, onward)   Start     Dose/Rate Route Frequency Ordered Stop   05/25/19 2200  vancomycin (VANCOCIN) IVPB 1000 mg/200 mL premix     1,000 mg 200 mL/hr over 60 Minutes Intravenous Every 24 hours 05/25/19 0958     05/24/19 2200  vancomycin (VANCOREADY) IVPB 750 mg/150 mL  Status:  Discontinued     750 mg 150 mL/hr over 60 Minutes Intravenous Every 24 hours 05/24/19 1156 05/25/19 0958   05/23/19 2200  vancomycin (VANCOCIN) IVPB 1000 mg/200 mL premix     1,000 mg 200 mL/hr over 60 Minutes Intravenous  Once 05/23/19 1345 05/24/19 0008   05/23/19 1113  vancomycin variable dose per unstable renal function (pharmacist dosing)  Status:  Discontinued      Does  not apply See admin instructions 05/23/19 1114 05/24/19 1156   05/22/19 1500  vancomycin (VANCOREADY) IVPB 1250 mg/250 mL  Status:  Discontinued     1,250 mg 166.7 mL/hr over 90 Minutes Intravenous Every 24 hours 05/22/19 1429 05/23/19 1114       Assessment/Plan HTN DM Schizophrenia Stress urinary incontinence Bilateral BKAs Left middle finger amputation Severe malnutrition - prealbumin 7.1 (3/13) Code status DNR  MRSA bacteremia - per primary, PICC line out, on vancomycin. Going for TEE today  Perforated prepyloric ulcer S/pEXPLORATORY LAPAROTOMY WITH OVERSEW OF DUODENAL ULCER with [name] patch11/23/2020 Dr. 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 Continue calorie count.    LOS: 5 days    [facility] 05/27/2019, 8:06 AM Please see Amion for pager number during day hours 7:00am-4:30pm

## 2019-05-27 NOTE — H&P (Signed)
Gina [name] is a 59 y.o. female who has presented today for surgery, with the diagnosis of bacteremia.  The various methods of treatment have been discussed with the patient and family. After consideration of risks, benefits and other options for treatment, the patient has consented to  Procedure(s): TRANSESOPHAGEAL ECHOCARDIOGRAM (TEE) (N/A) as a surgical intervention .  The patient's history has been reviewed, patient examined, no change in status, stable for surgery.  I have reviewed the patient's chart and labs.  Questions were answered to the patient's satisfaction.    Oaklen Thiam C. Oval Linsey, MD, [facility]  05/27/2019 12:38 PM

## 2019-05-27 NOTE — Progress Notes (Signed)
PROGRESS NOTE    Gina [name]  BJS:[id number] DOB: Jun 27, 1960 DOA: 05/22/2019 PCP: [name], MD   Brief Narrative: 59 year old female with DM, HTN, schizophrenia, bilateral BKA sent from [facility] due to positive blood culture.  Recent admission November 2020 for perforated prepyloric ulcer with septic shock with acute peritonitis status post exlap with [name] patch on 11/23, and was discharged to select LTAC on Unasyn Bactrim and antifungal for total 6 weeks.  Patient was discharged with Foley catheter, PICC line, bilateral lower quadrant drains along with enterocutaneous fistula with colostomy bag . She was discharged from [facility] to [facility]iere she has been living for last 3 months. Patient  Was sent to ER for vague sleepiness malaise anorexia low-grade fever and leukocytosis at SNF, and in the ER exam labs unremarkable and patient was sent back to SNF but overnight blood culture returned MRSA 2/2 and she was readmitted to the hospital.  Patient is being followed by infectious disease, general surgery.  She did well with speech, diet was restarted, PICC line was removed as no longer needing TPN and also needing line holidays given her MRSA bacteremia.  Foley catheter was exchanged.  Subjective:  Complains of some abdominal pain, feeling like as if she is going to have a bowel movement.  EC fistula with liquid output as expected. For TEE this afternoon.  Afebrile T-max 99.3, WBC 7.7K and hemoglobin 7.5 g  Assessment & Plan:  MRSA bacteremia: indwelling PICC line used for TPN is felt likely source and PICC is out. Appreciate ID on board. For TEE 3/16. Repeat blood cultures 3/12- Negative so far. TTE negative for endocarditis.  Await for further ID inputs for access and IV antibiotics duration  Intra-abdominal fluid collection with recent ex lap/partial small bowel resection and intra-abdominal abscess in November.General surgery following, right-sided drain came up 3/13 and  surgery aware and dressing in place.enterocutaneous fistula in placement w/ liquid bowel movement..  Bilateral stump wounds continue wound care and dressing.  Recent admission November 2020 for perforated prepyloric ulcer with septic shock with acute peritonitis status post exlap with [name] patch on 11/23-patient undergoing bilateral lower quadrant drain, enterocutaneous fistula with colostomy bag.  Upper GI shows fistulous connection at 2 points in the small bowel (proximal small bowel, suspect 1st site of communication is proximal jejunum) connecting to the known collection in the anterior abdomen and io the skin.  May need drain injection to further characterize fistula in the future as well as EGD per surgery and will need to follow-up as outpatient  Severe protein-calorie malnutrition: TPN dependent, dietitian on consult.  Now on oral diet continue supplementation, off TPN and PICC line.  Augment nutrition as tolerated.   continue calorie count to ensure she is able to maintain po intake to ensure good healing  HTN: BP well controlled , cont amlodipine.  Metabolic acidosis/elevated BUN/dehydration: Tolerating diet slowly wean off IV fluids, bicarb is low suspect due to her fistula. BUN has normalized from 46-->14. Stop ivf.   Diabetes mellitus: hbA1c 5.9 3/12.  With hypoglycemia blood sugar 67 3/12, discontinued Lantus and  Placed on D5 NS-discontinued ivf cont ssi. Recent Labs  Lab 05/26/19 1946 05/26/19 1959 05/26/19 2350 05/27/19 0409 05/27/19 0729  GLUCAP 85 84 89 76 83   Anemia of chronic disease: Previously hemoglobin has been ranging from 9 to 8 g, and drifting downward.  Improved to 7.5 g. Suspect multifactorial with chronic disease, no obvious bleeding present. HB low at 7.0., anemia panel ferritin  646, iron low at 22, B12 750, folate 22. Hemoccult was negative.  Below-knee amputee/Atherosclerosis of native arteries: Stump evaluation by wound care for her ilateral stump wounds    Transaminitis: Suspect from TPN.  Monitor closely.  Asymptomatic bacteriuria: replaceD Foley after admission.  GERD: on PPI  Mood disorder:on Celexa  Goals of care: Palliative care consulted patient is DNR MOST form filled.  Per recommendation to treat what is treatable and plan on palliative care referral upon discharge. Given significant comorbidities malnutrition high risk for readmission and decompensation.  Interventions: Refer to RD note for recommendations Body mass index is 22.02 kg/m.  Pressure Ulcer: Pressure Injury 05/22/19 Coccyx Bilateral Stage 2 -  Partial thickness loss of dermis presenting as a shallow open injury with a red, pink wound bed without slough. (Active)  05/22/19 2104  Location: Coccyx  Location Orientation: Bilateral  Staging: Stage 2 -  Partial thickness loss of dermis presenting as a shallow open injury with a red, pink wound bed without slough.  Wound Description (Comments):   Present on Admission: Yes     Pressure Injury 05/22/19 Knee Right;Distal Unstageable - Full thickness tissue loss in which the base of the injury is covered by slough (yellow, tan, gray, green or brown) and/or eschar (tan, brown or black) in the wound bed. Stump (Active)  05/22/19 2106  Location: Knee  Location Orientation: Right;Distal  Staging: Unstageable - Full thickness tissue loss in which the base of the injury is covered by slough (yellow, tan, gray, green or brown) and/or eschar (tan, brown or black) in the wound bed.  Wound Description (Comments): Stump  Present on Admission: Yes     Pressure Injury 05/22/19 Knee Left;Distal Unstageable - Full thickness tissue loss in which the base of the injury is covered by slough (yellow, tan, gray, green or brown) and/or eschar (tan, brown or black) in the wound bed. Stump (Active)  05/22/19 2108  Location: Knee  Location Orientation: Left;Distal  Staging: Unstageable - Full thickness tissue loss in which the base of the  injury is covered by slough (yellow, tan, gray, green or brown) and/or eschar (tan, brown or black) in the wound bed.  Wound Description (Comments): Stump  Present on Admission: Yes     Pressure Injury 05/22/19 Thigh Left;Mid Stage 2 -  Partial thickness loss of dermis presenting as a shallow open injury with a red, pink wound bed without slough. (Active)  05/22/19 2109  Location: Thigh  Location Orientation: Left;Mid  Staging: Stage 2 -  Partial thickness loss of dermis presenting as a shallow open injury with a red, pink wound bed without slough.  Wound Description (Comments):   Present on Admission: Yes   DVT prophylaxis:lovenox Code Status: DNR Family Communication: plan of care discussed with patient at bedside.  Disposition Plan: Patient is from: SNF Anticipated Disposition: to home with home and services.  Patient reports she is not going back to skilled nursing facility.   Barriers to discharge or conditions that needs to be met prior to discharge: Patient remains hospitalized for MRSA bacteremia treatment, await for ID input for further line and antibiotics recommendation, repeat blood culture negative from 3/12.  Hopefully home on 1-2 days once signed off by ID and surgery and is on calorie counts currently.  Consultants: Infectious disease, general surgery Procedures:  2D ECHO 1. Left ventricular ejection fraction, by estimation, is 55 to 60%. The  left ventricle has normal function. The left ventricle has no regional  wall motion abnormalities. Left  ventricular diastolic parameters were  normal.  2. Right ventricular systolic function is normal. The right ventricular  size is normal.  3. The mitral valve is normal in structure. No evidence of mitral valve  regurgitation. No evidence of mitral stenosis.  4. The aortic valve is normal in structure. Aortic valve regurgitation is  not visualized. No aortic stenosis is present.   Microbiology: Blood culture with MRSA  05/21/19  Medications: Scheduled Meds: . [MAR Hold] amLODipine  5 mg Oral Daily  . [MAR Hold] chlorhexidine  15 mL Mouth Rinse BID  . [MAR Hold] Chlorhexidine Gluconate Cloth  6 each Topical Daily  . [MAR Hold] enoxaparin (LOVENOX) injection  40 mg Subcutaneous Q24H  . [MAR Hold] escitalopram  10 mg Oral Daily  . [MAR Hold] feeding supplement (ENSURE ENLIVE)  237 mL Oral TID BM  . [MAR Hold] feeding supplement (PRO-STAT SUGAR FREE 64)  30 mL Oral BID WC  . [MAR Hold] gabapentin  300 mg Oral BID  . [MAR Hold] insulin aspart  0-9 Units Subcutaneous Q6H  . [MAR Hold] mouth rinse  15 mL Mouth Rinse q12n4p  . [MAR Hold] Melatonin  3 mg Oral QHS  . [MAR Hold] metoprolol tartrate  50 mg Oral BID  . [MAR Hold] multivitamin with minerals  1 tablet Oral Daily  . [MAR Hold] nystatin  5 mL Oral QID  . [MAR Hold] pantoprazole  40 mg Oral Daily  . [MAR Hold] simethicone  80 mg Oral QID   Continuous Infusions: . sodium chloride 20 mL/hr at 05/26/19 2252  . [MAR Hold] vancomycin 1,000 mg (05/26/19 2144)    Antimicrobials: Anti-infectives (From admission, onward)   Start     Dose/Rate Route Frequency Ordered Stop   05/25/19 2200  [MAR Hold]  vancomycin (VANCOCIN) IVPB 1000 mg/200 mL premix     (MAR Hold since Tue 05/27/2019 at 1143.Hold Reason: Transfer to a Procedural area.)   1,000 mg 200 mL/hr over 60 Minutes Intravenous Every 24 hours 05/25/19 0958     05/24/19 2200  vancomycin (VANCOREADY) IVPB 750 mg/150 mL  Status:  Discontinued     750 mg 150 mL/hr over 60 Minutes Intravenous Every 24 hours 05/24/19 1156 05/25/19 0958   05/23/19 2200  vancomycin (VANCOCIN) IVPB 1000 mg/200 mL premix     1,000 mg 200 mL/hr over 60 Minutes Intravenous  Once 05/23/19 1345 05/24/19 0008   05/23/19 1113  vancomycin variable dose per unstable renal function (pharmacist dosing)  Status:  Discontinued      Does not apply See admin instructions 05/23/19 1114 05/24/19 1156   05/22/19 1500  vancomycin (VANCOREADY)  IVPB 1250 mg/250 mL  Status:  Discontinued     1,250 mg 166.7 mL/hr over 90 Minutes Intravenous Every 24 hours 05/22/19 1429 05/23/19 1114       Objective: Vitals: Today's Vitals   05/27/19 0524 05/27/19 0853 05/27/19 0916 05/27/19 1140  BP:   (!) 152/71 (!) 154/70  Pulse:   64 68  Resp:   18 18  Temp:   98.1 F (36.7 C) 98.6 F (37 C)  TempSrc:   Oral Temporal  SpO2:   98% 94%  Weight:    54.6 kg  Height:    _0  (1.575 m)  PainSc: 2  Asleep  0-No pain    Intake/Output Summary (Last 24 hours) at 05/27/2019 1147 Last data filed at 05/27/2019 0900 Gross per 24 hour  Intake 1062.67 ml  Output 550 ml  Net 512.67 ml  Filed Weights   05/24/19 2007 05/25/19 2034 05/27/19 1140  Weight: 54.6 kg 54.6 kg 54.6 kg   Weight change:    Intake/Output from previous day: 03/15 0701 - 03/16 0700 In: 1182.7 [P.O.:840; I.V.:142.7; IV Piggyback:200] Out: 700 [Urine:300; Stool:400] Intake/Output this shift: Total I/O In: 0  Out: 450 [Urine:450]  Examination:  General exam: Alert awake ill, cachectic frail.   HEENT:Oral mucosa moist, Ear/Nose WNL grossly,dentition normal. Respiratory system: bilaterally clear,no use of accessory muscle, non tender. Cardiovascular system: S1 & S2 +, regular, No JVD. Gastrointestinal system: Abdomen soft, right lower quadrant with dressing present, EC fistula with liquidy stool and drain on the left quadrant present Nervous System:Alert, awake, moving extremities and grossly nonfocal Extremities: No edema, distal peripheral pulses palpable.  Skin: No rashes,no icterus. MSK: BKA bilaterally with a stump with wounds.    Data Reviewed: I have personally reviewed following labs and imaging studies CBC: Recent Labs  Lab 05/21/19 1038 05/22/19 1600 05/23/19 0534 05/24/19 0747 05/25/19 0632 05/26/19 0354 05/27/19 0429  WBC 12.5*   < > 9.7 8.3 8.4 8.1 7.7  NEUTROABS 9.5*  --   --   --   --   --   --   HGB 8.2*   < > 7.1* 7.0* 7.1* 7.5* 7.5*    HCT 26.0*   < > 22.1* 22.1* 22.4* 24.1* 23.8*  MCV 97.4   < > 94.0 94.4 94.1 96.4 96.7  PLT 172   < > 171 226 260 300 344   < > = values in this interval not displayed.   Basic Metabolic Panel: Recent Labs  Lab 05/23/19 0534 05/24/19 0522 05/25/19 0632 05/26/19 0354 05/27/19 0429  NA 137 135 134* 136 136  K 3.9 4.7 3.8 3.7 3.8  CL 102 106 105 108 109  CO2 23 19* 21* 17* 19*  GLUCOSE 109* 90 124* 139* 82  BUN 45* 38* 24* 18 14  CREATININE 1.11* 1.01* 0.84 0.83 0.71  CALCIUM 8.7* 8.0* 8.1* 8.3* 8.2*   GFR: Estimated Creatinine Clearance: 60.6 mL/min (by C-G formula based on SCr of 0.71 mg/dL). Liver Function Tests: Recent Labs  Lab 05/23/19 0534 05/24/19 0522 05/25/19 0632 05/26/19 0354 05/27/19 0429  AST 36 43* _0 ALT 46* 35 _1 ALKPHOS 169* 150* 154* 152* 135*  BILITOT 1.5* 1.8* 1.0 0.8 0.5  PROT 6.8 5.9* 6.5 6.9 6.4*  ALBUMIN 1.6* 1.5* 1.5* 1.6* 1.5*   No results for input(s): LIPASE, AMYLASE in the last 168 hours. No results for input(s): AMMONIA in the last 168 hours. Coagulation Profile: No results for input(s): INR, PROTIME in the last 168 hours. Cardiac Enzymes: No results for input(s): CKTOTAL, CKMB, CKMBINDEX, TROPONINI in the last 168 hours. BNP (last 3 results) No results for input(s): PROBNP in the last 8760 hours. HbA1C: No results for input(s): HGBA1C in the last 72 hours. CBG: Recent Labs  Lab 05/26/19 1946 05/26/19 1959 05/26/19 2350 05/27/19 0409 05/27/19 0729  GLUCAP 85 84 89 76 83   Lipid Profile: No results for input(s): CHOL, HDL, LDLCALC, TRIG, CHOLHDL, LDLDIRECT in the last 72 hours. Thyroid Function Tests: No results for input(s): TSH, T4TOTAL, FREET4, T3FREE, THYROIDAB in the last 72 hours. Anemia Panel: Recent Labs    05/25/19 0632  VITAMINB12 750  FOLATE 22.6  FERRITIN 646*  TIBC 126*  IRON 22*  RETICCTPCT 1.5   Sepsis Labs: Recent Labs  Lab 05/21/19 1038 05/22/19 1633  LATICACIDVEN 1.5 1.2  Recent Results (from the past 240 hour(s))  Urine Culture     Status: Abnormal   Collection Time: 05/21/19 10:00 AM   Specimen: Urine, Catheterized  Result Value Ref Range Status   Specimen Description URINE, CATHETERIZED  Final   Special Requests   Final    NONE Performed at [facility], [address] [place], [place] [address]    Culture (A)  Final    >=100,000 COLONIES/mL PSEUDOMONAS AERUGINOSA 50,000 COLONIES/mL VANCOMYCIN RESISTANT ENTEROCOCCUS ISOLATED    Report Status 05/24/2019 FINAL  Final   Organism ID, Bacteria PSEUDOMONAS AERUGINOSA (A)  Final   Organism ID, Bacteria VANCOMYCIN RESISTANT ENTEROCOCCUS ISOLATED (A)  Final      Susceptibility   Pseudomonas aeruginosa - MIC*    CEFTAZIDIME 4 SENSITIVE Sensitive     CIPROFLOXACIN <=0.25 SENSITIVE Sensitive     GENTAMICIN <=1 SENSITIVE Sensitive     IMIPENEM 1 SENSITIVE Sensitive     PIP/TAZO <=4 SENSITIVE Sensitive     CEFEPIME 2 SENSITIVE Sensitive     * >=100,000 COLONIES/mL PSEUDOMONAS AERUGINOSA   Vancomycin resistant enterococcus isolated - MIC*    AMPICILLIN >=32 RESISTANT Resistant     NITROFURANTOIN 256 RESISTANT Resistant     VANCOMYCIN >=32 RESISTANT Resistant     LINEZOLID 2 SENSITIVE Sensitive     * 50,000 COLONIES/mL VANCOMYCIN RESISTANT ENTEROCOCCUS ISOLATED  Respiratory Panel by RT PCR (Flu A&B, Covid) - Nasopharyngeal Swab     Status: None   Collection Time: 05/21/19 10:03 AM   Specimen: Nasopharyngeal Swab  Result Value Ref Range Status   SARS Coronavirus 2 by RT PCR NEGATIVE NEGATIVE Final    Comment: (NOTE) SARS-CoV-2 target nucleic acids are NOT DETECTED. The SARS-CoV-2 RNA is generally detectable in upper respiratoy specimens during the acute phase of infection. The lowest concentration of SARS-CoV-2 viral copies this assay can detect is 131 copies/mL. A negative result does not preclude SARS-Cov-2 infection and should not be used as the sole basis for treatment or other patient  management decisions. A negative result may occur with  improper specimen collection/handling, submission of specimen other than nasopharyngeal swab, presence of viral mutation(s) within the areas targeted by this assay, and inadequate number of viral copies (<131 copies/mL). A negative result must be combined with clinical observations, patient history, and epidemiological information. The expected result is Negative. Fact Sheet for Patients:  PinkCheek.be Fact Sheet for Healthcare Providers:  GravelBags.it This test is not yet ap proved or cleared by the [place] FDA and  has been authorized for detection and/or diagnosis of SARS-CoV-2 by FDA under an Emergency Use Authorization (EUA). This EUA will remain  in effect (meaning this test can be used) for the duration of the COVID-19 declaration under Section 564(b)(1) of the Act, 21 U.S.C. section 360bbb-3(b)(1), unless the authorization is terminated or revoked sooner.    Influenza A by PCR NEGATIVE NEGATIVE Final   Influenza B by PCR NEGATIVE NEGATIVE Final    Comment: (NOTE) The Xpert Xpress SARS-CoV-2/FLU/RSV assay is intended as an aid in  the diagnosis of influenza from Nasopharyngeal swab specimens and  should not be used as a sole basis for treatment. Nasal washings and  aspirates are unacceptable for Xpert Xpress SARS-CoV-2/FLU/RSV  testing. Fact Sheet for Patients: PinkCheek.be Fact Sheet for Healthcare Providers: GravelBags.it This test is not yet approved or cleared by the [place] FDA and  has been authorized for detection and/or diagnosis of SARS-CoV-2 by  FDA under an Emergency Use Authorization (  EUA). This EUA will remain  in effect (meaning this test can be used) for the duration of the  Covid-19 declaration under Section 564(b)(1) of the Act, 21  U.S.C. section 360bbb-3(b)(1), unless the  authorization is  terminated or revoked. Performed at [facility], [place] [address] [place], [place] [address]   Blood culture (routine x 2)     Status: Abnormal   Collection Time: 05/21/19 10:38 AM   Specimen: BLOOD RIGHT ARM  Result Value Ref Range Status   Specimen Description BLOOD RIGHT ARM  Final   Special Requests   Final    AEROBIC BOTTLE ONLY Blood Culture results may not be optimal due to an inadequate volume of blood received in culture bottles   Culture  Setup Time   Final    GRAM POSITIVE COCCI IN CLUSTERS AEROBIC BOTTLE ONLY CRITICAL RESULT CALLED TO, READ BACK BY AND VERIFIED WITH: Macky Lower RN, AT [phone] 05/22/19 BY Rush Landmark Performed at [facility], [place] [address] [place], [place] [address]    Culture METHICILLIN RESISTANT STAPHYLOCOCCUS AUREUS (A)  Final   Report Status 05/24/2019 FINAL  Final   Organism ID, Bacteria METHICILLIN RESISTANT STAPHYLOCOCCUS AUREUS  Final      Susceptibility   Methicillin resistant staphylococcus aureus - MIC*    CIPROFLOXACIN >=8 RESISTANT Resistant     ERYTHROMYCIN <=0.25 SENSITIVE Sensitive     GENTAMICIN <=0.5 SENSITIVE Sensitive     OXACILLIN >=4 RESISTANT Resistant     TETRACYCLINE <=1 SENSITIVE Sensitive     VANCOMYCIN 1 SENSITIVE Sensitive     TRIMETH/SULFA <=10 SENSITIVE Sensitive     CLINDAMYCIN <=0.25 SENSITIVE Sensitive     RIFAMPIN <=0.5 SENSITIVE Sensitive     Inducible Clindamycin NEGATIVE Sensitive     * METHICILLIN RESISTANT STAPHYLOCOCCUS AUREUS  Blood culture (routine x 2)     Status: Abnormal   Collection Time: 05/21/19 10:38 AM   Specimen: BLOOD RIGHT HAND  Result Value Ref Range Status   Specimen Description BLOOD RIGHT HAND  Final   Special Requests   Final    AEROBIC BOTTLE ONLY Blood Culture results may not be optimal due to an inadequate volume of blood received in culture bottles   Culture  Setup Time   Final    AEROBIC BOTTLE ONLY GRAM POSITIVE COCCI IN CLUSTERS CRITICAL RESULT CALLED TO,  READ BACK BY AND VERIFIED WITH: K NEAL RN 05/22/19 0137 JDW    Culture (A)  Final    STAPHYLOCOCCUS AUREUS SUSCEPTIBILITIES PERFORMED ON PREVIOUS CULTURE WITHIN THE LAST 5 DAYS. Performed at [facility], [place] [address], [place], [place] [address]    Report Status 05/24/2019 FINAL  Final  Blood Culture ID Panel (Reflexed)     Status: Abnormal   Collection Time: 05/21/19 10:38 AM  Result Value Ref Range Status   Enterococcus species NOT DETECTED NOT DETECTED Final   Listeria monocytogenes NOT DETECTED NOT DETECTED Final   Staphylococcus species DETECTED (A) NOT DETECTED Final    Comment: CRITICAL RESULT CALLED TO, READ BACK BY AND VERIFIED WITH: S. COBLE RN, AT [phone] 05/22/19 BY D. VANHOOK    Staphylococcus aureus (BCID) DETECTED (A) NOT DETECTED Final    Comment: Methicillin (oxacillin)-resistant Staphylococcus aureus (MRSA). MRSA is predictably resistant to beta-lactam antibiotics (except ceftaroline). Preferred therapy is vancomycin unless clinically contraindicated. Patient requires contact precautions if  hospitalized. CRITICAL RESULT CALLED TO, READ BACK BY AND VERIFIED WITH: SCurt Bears RN, AT [phone] 05/22/19 BY D. Victoriano Lain  Methicillin resistance DETECTED (A) NOT DETECTED Final    Comment: CRITICAL RESULT CALLED TO, READ BACK BY AND VERIFIED WITH: S. COBLE RN, AT [phone] 05/22/19 BY D. VANHOOK    Streptococcus species NOT DETECTED NOT DETECTED Final   Streptococcus agalactiae NOT DETECTED NOT DETECTED Final   Streptococcus pneumoniae NOT DETECTED NOT DETECTED Final   Streptococcus pyogenes NOT DETECTED NOT DETECTED Final   Acinetobacter baumannii NOT DETECTED NOT DETECTED Final   Enterobacteriaceae species NOT DETECTED NOT DETECTED Final   Enterobacter cloacae complex NOT DETECTED NOT DETECTED Final   Escherichia coli NOT DETECTED NOT DETECTED Final   Klebsiella oxytoca NOT DETECTED NOT DETECTED Final   Klebsiella pneumoniae NOT DETECTED NOT DETECTED Final   Proteus species NOT  DETECTED NOT DETECTED Final   Serratia marcescens NOT DETECTED NOT DETECTED Final   Haemophilus influenzae NOT DETECTED NOT DETECTED Final   Neisseria meningitidis NOT DETECTED NOT DETECTED Final   Pseudomonas aeruginosa NOT DETECTED NOT DETECTED Final   Candida albicans NOT DETECTED NOT DETECTED Final   Candida glabrata NOT DETECTED NOT DETECTED Final   Candida krusei NOT DETECTED NOT DETECTED Final   Candida parapsilosis NOT DETECTED NOT DETECTED Final   Candida tropicalis NOT DETECTED NOT DETECTED Final    Comment: Performed at [facility], [place]. [address], [place], [place] [address]  SARS CORONAVIRUS 2 (TAT 6-24 HRS) Nasopharyngeal Nasopharyngeal Swab     Status: None   Collection Time: 05/22/19  2:45 PM   Specimen: Nasopharyngeal Swab  Result Value Ref Range Status   SARS Coronavirus 2 NEGATIVE NEGATIVE Final    Comment: (NOTE) SARS-CoV-2 target nucleic acids are NOT DETECTED. The SARS-CoV-2 RNA is generally detectable in upper and lower respiratory specimens during the acute phase of infection. Negative results do not preclude SARS-CoV-2 infection, do not rule out co-infections with other pathogens, and should not be used as the sole basis for treatment or other patient management decisions. Negative results must be combined with clinical observations, patient history, and epidemiological information. The expected result is Negative. Fact Sheet for Patients: SugarRoll.be Fact Sheet for Healthcare Providers: https://www.woods-mathews.com/ This test is not yet approved or cleared by the [place] FDA and  has been authorized for detection and/or diagnosis of SARS-CoV-2 by FDA under an Emergency Use Authorization (EUA). This EUA will remain  in effect (meaning this test can be used) for the duration of the COVID-19 declaration under Section 56 4(b)(1) of the Act, 21 U.S.C. section 360bbb-3(b)(1), unless the authorization is  terminated or revoked sooner. Performed at [facility], [place] [address], [place], [place] [address]   Culture, blood (routine x 2)     Status: None (Preliminary result)   Collection Time: 05/23/19  5:34 AM   Specimen: BLOOD  Result Value Ref Range Status   Specimen Description BLOOD LEFT ARM  Final   Special Requests   Final    BOTTLES DRAWN AEROBIC AND ANAEROBIC Blood Culture results may not be optimal due to an excessive volume of blood received in culture bottles   Culture   Final    NO GROWTH 4 DAYS Performed at [facility], [place] [address] [place],  [address]    Report Status PENDING  Incomplete  Culture, blood (routine x 2)     Status: None (Preliminary result)   Collection Time: 05/23/19  5:42 AM   Specimen: BLOOD  Result Value Ref Range Status   Specimen Description BLOOD LEFT HAND  Final  Special Requests   Final    BOTTLES DRAWN AEROBIC AND ANAEROBIC Blood Culture adequate volume   Culture   Final    NO GROWTH 4 DAYS Performed at [facility], [place] [address], [place], [place] [address]    Report Status PENDING  Incomplete      Radiology Studies: No results found.  LOS: 5 days  Time spent: More than 50% of that time was spent in counseling and/or coordination of care. Antonieta Pert, MD [facility]  05/27/2019, 11:47 AM

## 2019-05-27 NOTE — Progress Notes (Addendum)
Patient was seen, examined,treatment plan was discussed with the  Advance Practice Provider.  I have personally reviewed the clinical findings, labs, imaging studies and management of this patient in detail.  I agree with the documentation, as recorded by the Advance Practice Provider.    TEE negative for vegetation.  Treat with IV vancomycin through 3/25. I will place OPAT.   OK for picc line and I will order now.   I will sign off, call with any questions.            [facility]  Date of Admission:  05/22/2019     Total days of antibiotics 7         ASSESSMENT:  Ms. Hannig remains afebrile and repeat blood cultures are without growth to date. Appears PICC line may have been source of infection. Awaiting TEE to rule out endocarditis. Renal function is stable and no evidence of nephrotoxicity. Appears to be tolerating oral intake thus far. Continue vancomycin.    PLAN:  1. Continue vancomycin. 2. Monitor vancomycin level and renal function for therapeutic monitoring. 3. Await TEE results today r/o endocarditis. 4. Continue oral intake and drain management per general surgery.   Principal Problem:   MRSA bacteremia Active Problems:   HTN (hypertension)   Diabetes ([place])   Severe protein-calorie malnutrition ([place])   Anemia of chronic disease   Type 2 diabetes mellitus with hyperosmolar nonketotic hyperglycemia (HCC)   Pressure injury of right thigh, unstageable (HCC)   Below-knee amputee ([place])   Atherosclerosis of native arteries of extremities with gangrene, right leg (HCC)   Intra-abdominal fluid collection   Palliative care by specialist   Goals of care, counseling/discussion   DNR (do not resuscitate)   Physical deconditioning   Dysphagia   . amLODipine  5 mg Oral Daily  . chlorhexidine  15 mL Mouth Rinse BID  . Chlorhexidine Gluconate Cloth  6 each Topical Daily  . enoxaparin (LOVENOX) injection  40 mg Subcutaneous Q24H  .  escitalopram  10 mg Oral Daily  . feeding supplement (ENSURE ENLIVE)  237 mL Oral TID BM  . feeding supplement (PRO-STAT SUGAR FREE 64)  30 mL Oral BID WC  . gabapentin  300 mg Oral BID  . insulin aspart  0-9 Units Subcutaneous Q6H  . mouth rinse  15 mL Mouth Rinse q12n4p  . Melatonin  3 mg Oral QHS  . metoprolol tartrate  50 mg Oral BID  . multivitamin with minerals  1 tablet Oral Daily  . nystatin  5 mL Oral QID  . pantoprazole  40 mg Oral Daily  . simethicone  80 mg Oral QID    SUBJECTIVE:  Afebrile overnight with no acute events. Having abdominal pain this morning and currently NPO for her TEE. Repeat blood cultures from 3/12 are without growth to date.    Allergies  Allergen Reactions  . Bee Venom Swelling    Swells up with bee stings   . Metformin And Related Other (See Comments)    Upset stomach     Review of Systems: Review of Systems  Constitutional: Negative for chills, fever and weight loss.  Respiratory: Negative for cough, shortness of breath and wheezing.   Cardiovascular: Negative for chest pain and leg swelling.  Gastrointestinal: Positive for abdominal pain. Negative for constipation, diarrhea, nausea and vomiting.  Skin: Negative for rash.      OBJECTIVE: Vitals:   05/26/19 1603 05/26/19 1959 05/27/19 0409 05/27/19 0916  BP: (!) 143/88  139/90 (!) 156/74 (!) 152/71  Pulse: 72 74 68 64  Resp: 18 16 15 18   Temp: 99.3 F (37.4 C) 98.4 F (36.9 C) 98.5 F (36.9 C) 98.1 F (36.7 C)  TempSrc: Oral   Oral  SpO2: 95% 99% 98% 98%  Weight:      Height:       Body mass index is 22.02 kg/m.  Physical Exam Constitutional:      General: She is not in acute distress.    Appearance: She is well-developed.     Comments: Lying in bed with head of bed elevated; pleasant.   Cardiovascular:     Rate and Rhythm: Normal rate and regular rhythm.     Heart sounds: Normal heart sounds.  Pulmonary:     Effort: Pulmonary effort is normal.     Breath sounds:  Normal breath sounds.  Musculoskeletal:     Comments: Bilateral BKA.   Skin:    General: Skin is warm and dry.  Neurological:     Mental Status: She is alert and oriented to person, place, and time.  Psychiatric:        Behavior: Behavior normal.        Thought Content: Thought content normal.        Judgment: Judgment normal.     Lab Results Lab Results  Component Value Date   WBC 7.7 05/27/2019   HGB 7.5 (L) 05/27/2019   HCT 23.8 (L) 05/27/2019   MCV 96.7 05/27/2019   PLT 344 05/27/2019    Lab Results  Component Value Date   CREATININE 0.71 05/27/2019   BUN 14 05/27/2019   NA 136 05/27/2019   K 3.8 05/27/2019   CL 109 05/27/2019   CO2 19 (L) 05/27/2019    Lab Results  Component Value Date   ALT 21 05/27/2019   AST 18 05/27/2019   ALKPHOS 135 (H) 05/27/2019   BILITOT 0.5 05/27/2019     Microbiology: Recent Results (from the past 240 hour(s))  Urine Culture     Status: Abnormal   Collection Time: 05/21/19 10:00 AM   Specimen: Urine, Catheterized  Result Value Ref Range Status   Specimen Description URINE, CATHETERIZED  Final   Special Requests   Final    NONE Performed at [facility], [address] [place], [place] [address]    Culture (A)  Final    >=100,000 COLONIES/mL PSEUDOMONAS AERUGINOSA 50,000 COLONIES/mL VANCOMYCIN RESISTANT ENTEROCOCCUS ISOLATED    Report Status 05/24/2019 FINAL  Final   Organism ID, Bacteria PSEUDOMONAS AERUGINOSA (A)  Final   Organism ID, Bacteria VANCOMYCIN RESISTANT ENTEROCOCCUS ISOLATED (A)  Final      Susceptibility   Pseudomonas aeruginosa - MIC*    CEFTAZIDIME 4 SENSITIVE Sensitive     CIPROFLOXACIN <=0.25 SENSITIVE Sensitive     GENTAMICIN <=1 SENSITIVE Sensitive     IMIPENEM 1 SENSITIVE Sensitive     PIP/TAZO <=4 SENSITIVE Sensitive     CEFEPIME 2 SENSITIVE Sensitive     * >=100,000 COLONIES/mL PSEUDOMONAS AERUGINOSA   Vancomycin resistant enterococcus isolated - MIC*    AMPICILLIN >=32 RESISTANT  Resistant     NITROFURANTOIN 256 RESISTANT Resistant     VANCOMYCIN >=32 RESISTANT Resistant     LINEZOLID 2 SENSITIVE Sensitive     * 50,000 COLONIES/mL VANCOMYCIN RESISTANT ENTEROCOCCUS ISOLATED  Respiratory Panel by RT PCR (Flu A&B, Covid) - Nasopharyngeal Swab     Status: None   Collection Time: 05/21/19 10:03 AM   Specimen:  Nasopharyngeal Swab  Result Value Ref Range Status   SARS Coronavirus 2 by RT PCR NEGATIVE NEGATIVE Final    Comment: (NOTE) SARS-CoV-2 target nucleic acids are NOT DETECTED. The SARS-CoV-2 RNA is generally detectable in upper respiratoy specimens during the acute phase of infection. The lowest concentration of SARS-CoV-2 viral copies this assay can detect is 131 copies/mL. A negative result does not preclude SARS-Cov-2 infection and should not be used as the sole basis for treatment or other patient management decisions. A negative result may occur with  improper specimen collection/handling, submission of specimen other than nasopharyngeal swab, presence of viral mutation(s) within the areas targeted by this assay, and inadequate number of viral copies (<131 copies/mL). A negative result must be combined with clinical observations, patient history, and epidemiological information. The expected result is Negative. Fact Sheet for Patients:  PinkCheek.be Fact Sheet for Healthcare Providers:  GravelBags.it This test is not yet ap proved or cleared by the [place] FDA and  has been authorized for detection and/or diagnosis of SARS-CoV-2 by FDA under an Emergency Use Authorization (EUA). This EUA will remain  in effect (meaning this test can be used) for the duration of the COVID-19 declaration under Section 564(b)(1) of the Act, 21 U.S.C. section 360bbb-3(b)(1), unless the authorization is terminated or revoked sooner.    Influenza A by PCR NEGATIVE NEGATIVE Final   Influenza B by PCR NEGATIVE  NEGATIVE Final    Comment: (NOTE) The Xpert Xpress SARS-CoV-2/FLU/RSV assay is intended as an aid in  the diagnosis of influenza from Nasopharyngeal swab specimens and  should not be used as a sole basis for treatment. Nasal washings and  aspirates are unacceptable for Xpert Xpress SARS-CoV-2/FLU/RSV  testing. Fact Sheet for Patients: PinkCheek.be Fact Sheet for Healthcare Providers: GravelBags.it This test is not yet approved or cleared by the [place] FDA and  has been authorized for detection and/or diagnosis of SARS-CoV-2 by  FDA under an Emergency Use Authorization (EUA). This EUA will remain  in effect (meaning this test can be used) for the duration of the  Covid-19 declaration under Section 564(b)(1) of the Act, 21  U.S.C. section 360bbb-3(b)(1), unless the authorization is  terminated or revoked. Performed at [facility], [place] [address] [place], [place] [address]   Blood culture (routine x 2)     Status: Abnormal   Collection Time: 05/21/19 10:38 AM   Specimen: BLOOD RIGHT ARM  Result Value Ref Range Status   Specimen Description BLOOD RIGHT ARM  Final   Special Requests   Final    AEROBIC BOTTLE ONLY Blood Culture results may not be optimal due to an inadequate volume of blood received in culture bottles   Culture  Setup Time   Final    GRAM POSITIVE COCCI IN CLUSTERS AEROBIC BOTTLE ONLY CRITICAL RESULT CALLED TO, READ BACK BY AND VERIFIED WITH: Macky Lower RN, AT [phone] 05/22/19 BY Rush Landmark Performed at [facility], [place] [address], [place], [place] [address]    Culture METHICILLIN RESISTANT STAPHYLOCOCCUS AUREUS (A)  Final   Report Status 05/24/2019 FINAL  Final   Organism ID, Bacteria METHICILLIN RESISTANT STAPHYLOCOCCUS AUREUS  Final      Susceptibility   Methicillin resistant staphylococcus aureus - MIC*    CIPROFLOXACIN >=8 RESISTANT Resistant     ERYTHROMYCIN <=0.25 SENSITIVE Sensitive      GENTAMICIN <=0.5 SENSITIVE Sensitive     OXACILLIN >=4 RESISTANT Resistant     TETRACYCLINE <=1 SENSITIVE Sensitive  VANCOMYCIN 1 SENSITIVE Sensitive     TRIMETH/SULFA <=10 SENSITIVE Sensitive     CLINDAMYCIN <=0.25 SENSITIVE Sensitive     RIFAMPIN <=0.5 SENSITIVE Sensitive     Inducible Clindamycin NEGATIVE Sensitive     * METHICILLIN RESISTANT STAPHYLOCOCCUS AUREUS  Blood culture (routine x 2)     Status: Abnormal   Collection Time: 05/21/19 10:38 AM   Specimen: BLOOD RIGHT HAND  Result Value Ref Range Status   Specimen Description BLOOD RIGHT HAND  Final   Special Requests   Final    AEROBIC BOTTLE ONLY Blood Culture results may not be optimal due to an inadequate volume of blood received in culture bottles   Culture  Setup Time   Final    AEROBIC BOTTLE ONLY GRAM POSITIVE COCCI IN CLUSTERS CRITICAL RESULT CALLED TO, READ BACK BY AND VERIFIED WITH: K NEAL RN 05/22/19 0137 JDW    Culture (A)  Final    STAPHYLOCOCCUS AUREUS SUSCEPTIBILITIES PERFORMED ON PREVIOUS CULTURE WITHIN THE LAST 5 DAYS. Performed at [facility], [place] [address] [place], [place] [address]    Report Status 05/24/2019 FINAL  Final  Blood Culture ID Panel (Reflexed)     Status: Abnormal   Collection Time: 05/21/19 10:38 AM  Result Value Ref Range Status   Enterococcus species NOT DETECTED NOT DETECTED Final   Listeria monocytogenes NOT DETECTED NOT DETECTED Final   Staphylococcus species DETECTED (A) NOT DETECTED Final    Comment: CRITICAL RESULT CALLED TO, READ BACK BY AND VERIFIED WITH: S. COBLE RN, AT [phone] 05/22/19 BY D. VANHOOK    Staphylococcus aureus (BCID) DETECTED (A) NOT DETECTED Final    Comment: Methicillin (oxacillin)-resistant Staphylococcus aureus (MRSA). MRSA is predictably resistant to beta-lactam antibiotics (except ceftaroline). Preferred therapy is vancomycin unless clinically contraindicated. Patient requires contact precautions if  hospitalized. CRITICAL RESULT CALLED TO, READ  BACK BY AND VERIFIED WITH: S. COBLE RN, AT [phone] 05/22/19 BY D. VANHOOK    Methicillin resistance DETECTED (A) NOT DETECTED Final    Comment: CRITICAL RESULT CALLED TO, READ BACK BY AND VERIFIED WITH: S. COBLE RN, AT [phone] 05/22/19 BY D. VANHOOK    Streptococcus species NOT DETECTED NOT DETECTED Final   Streptococcus agalactiae NOT DETECTED NOT DETECTED Final   Streptococcus pneumoniae NOT DETECTED NOT DETECTED Final   Streptococcus pyogenes NOT DETECTED NOT DETECTED Final   Acinetobacter baumannii NOT DETECTED NOT DETECTED Final   Enterobacteriaceae species NOT DETECTED NOT DETECTED Final   Enterobacter cloacae complex NOT DETECTED NOT DETECTED Final   Escherichia coli NOT DETECTED NOT DETECTED Final   Klebsiella oxytoca NOT DETECTED NOT DETECTED Final   Klebsiella pneumoniae NOT DETECTED NOT DETECTED Final   Proteus species NOT DETECTED NOT DETECTED Final   Serratia marcescens NOT DETECTED NOT DETECTED Final   Haemophilus influenzae NOT DETECTED NOT DETECTED Final   Neisseria meningitidis NOT DETECTED NOT DETECTED Final   Pseudomonas aeruginosa NOT DETECTED NOT DETECTED Final   Candida albicans NOT DETECTED NOT DETECTED Final   Candida glabrata NOT DETECTED NOT DETECTED Final   Candida krusei NOT DETECTED NOT DETECTED Final   Candida parapsilosis NOT DETECTED NOT DETECTED Final   Candida tropicalis NOT DETECTED NOT DETECTED Final    Comment: Performed at [facility], [place]. [address] [place], [place] [address]  SARS CORONAVIRUS 2 (TAT 6-24 HRS) Nasopharyngeal Nasopharyngeal Swab     Status: None   Collection Time: 05/22/19  2:45 PM   Specimen: Nasopharyngeal Swab  Result Value Ref Range  Status   SARS Coronavirus 2 NEGATIVE NEGATIVE Final    Comment: (NOTE) SARS-CoV-2 target nucleic acids are NOT DETECTED. The SARS-CoV-2 RNA is generally detectable in upper and lower respiratory specimens during the acute phase of infection. Negative results do not preclude SARS-CoV-2  infection, do not rule out co-infections with other pathogens, and should not be used as the sole basis for treatment or other patient management decisions. Negative results must be combined with clinical observations, patient history, and epidemiological information. The expected result is Negative. Fact Sheet for Patients: SugarRoll.be Fact Sheet for Healthcare Providers: https://www.woods-mathews.com/ This test is not yet approved or cleared by the [place] FDA and  has been authorized for detection and/or diagnosis of SARS-CoV-2 by FDA under an Emergency Use Authorization (EUA). This EUA will remain  in effect (meaning this test can be used) for the duration of the COVID-19 declaration under Section 56 4(b)(1) of the Act, 21 U.S.C. section 360bbb-3(b)(1), unless the authorization is terminated or revoked sooner. Performed at [facility], [place] [address] [place], [place] [address]   Culture, blood (routine x 2)     Status: None (Preliminary result)   Collection Time: 05/23/19  5:34 AM   Specimen: BLOOD  Result Value Ref Range Status   Specimen Description BLOOD LEFT ARM  Final   Special Requests   Final    BOTTLES DRAWN AEROBIC AND ANAEROBIC Blood Culture results may not be optimal due to an excessive volume of blood received in culture bottles   Culture   Final    NO GROWTH 4 DAYS Performed at [facility], [place] [address], [place], [place] [address]    Report Status PENDING  Incomplete  Culture, blood (routine x 2)     Status: None (Preliminary result)   Collection Time: 05/23/19  5:42 AM   Specimen: BLOOD  Result Value Ref Range Status   Specimen Description BLOOD LEFT HAND  Final   Special Requests   Final    BOTTLES DRAWN AEROBIC AND ANAEROBIC Blood Culture adequate volume   Culture   Final    NO GROWTH 4 DAYS Performed at [facility], [place] [address] [place], [place] [address]    Report Status PENDING   Incomplete     Terri Piedra, [facility]   05/27/2019  11:19 AM

## 2019-05-27 NOTE — Progress Notes (Signed)
PHARMACY CONSULT NOTE FOR:  OUTPATIENT  PARENTERAL ANTIBIOTIC THERAPY (OPAT)  Indication: MRSA Bacteremia Regimen: Vancomycin 1g Q 24 hrs End date: 06/05/19  IV antibiotic discharge orders are pended. To discharging provider:  please sign these orders via discharge navigator,  Select New Orders & click on the button choice - Manage This Unsigned Work.     Thank you for allowing pharmacy to be a part of this patient's care.  Jimmy Footman, PharmD, BCPS, BCIDP Infectious Diseases Clinical Pharmacist Phone: [phone]  Please check AMION for all [place] phone numbers After 10:00 PM, call [place] [phone]

## 2019-05-27 NOTE — Anesthesia Procedure Notes (Signed)
Procedure Name: MAC Date/Time: 05/27/2019 12:42 PM Performed by: Wilburn Cornelia, CRNA Pre-anesthesia Checklist: Patient identified, Emergency Drugs available, Suction available, Patient being monitored and Timeout performed Patient Re-evaluated:Patient Re-evaluated prior to induction Oxygen Delivery Method: Nasal cannula Placement Confirmation: positive ETCO2 and breath sounds checked- equal and bilateral Dental Injury: Teeth and Oropharynx as per pre-operative assessment

## 2019-05-27 NOTE — Anesthesia Postprocedure Evaluation (Signed)
Anesthesia Post Note  Patient: Dayzee Trower  Procedure(s) Performed: TRANSESOPHAGEAL ECHOCARDIOGRAM (TEE) (N/A )     Patient location during evaluation: Endoscopy Anesthesia Type: MAC Level of consciousness: awake and alert Pain management: pain level controlled Vital Signs Assessment: post-procedure vital signs reviewed and stable Respiratory status: spontaneous breathing, nonlabored ventilation and respiratory function stable Cardiovascular status: blood pressure returned to baseline and stable Postop Assessment: no apparent nausea or vomiting Anesthetic complications: no    Last Vitals:  Vitals:   05/27/19 1317 05/27/19 1325  BP: (!) 113/46 (!) 130/53  Pulse: 62 66  Resp: 11 18  Temp:    SpO2: 97% 95%    Last Pain:  Vitals:   05/27/19 1325  TempSrc:   PainSc: 0-No pain                 Lidia Collum

## 2019-05-27 NOTE — Transfer of Care (Signed)
Immediate Anesthesia Transfer of Care Note  Patient: Gina [name]  Procedure(s) Performed: TRANSESOPHAGEAL ECHOCARDIOGRAM (TEE) (N/A )  Patient Location: Endoscopy Unit  Anesthesia Type:MAC  Level of Consciousness: awake and alert   Airway & Oxygen Therapy: Patient Spontanous Breathing and Patient connected to nasal cannula oxygen  Post-op Assessment: Report given to RN and Post -op Vital signs reviewed and stable  Post vital signs: Reviewed and stable  Last Vitals:  Vitals Value Taken Time  BP    Temp    Pulse    Resp    SpO2      Last Pain:  Vitals:   05/27/19 1140  TempSrc: Temporal  PainSc: 0-No pain      Patients Stated Pain Goal: 0 (05/05/34 1224)  Complications: No apparent anesthesia complications

## 2019-05-27 NOTE — Progress Notes (Signed)
PHARMACY CONSULT NOTE FOR:  OUTPATIENT  PARENTERAL ANTIBIOTIC THERAPY (OPAT)  Indication: MRSA Bacteremia Regimen: Vancomycin 1g Q 24 hrs End date: 06/04/17  IV antibiotic discharge orders are pended. To discharging provider:  please sign these orders via discharge navigator,  Select New Orders & click on the button choice - Manage This Unsigned Work.     Thank you for allowing pharmacy to be a part of this patient's care.  Benetta Spar, PharmD, BCPS, BCCP Clinical Pharmacist  Please check AMION for all [place] phone numbers After 10:00 PM, call [place] [phone]

## 2019-05-28 ENCOUNTER — Inpatient Hospital Stay: Payer: Self-pay

## 2019-05-28 LAB — CULTURE, BLOOD (ROUTINE X 2)
Culture: NO GROWTH
Culture: NO GROWTH
Special Requests: ADEQUATE

## 2019-05-28 LAB — COMPREHENSIVE METABOLIC PANEL
ALT: 17 U/L (ref 0–44)
AST: 15 U/L (ref 15–41)
Albumin: 1.5 g/dL — ABNORMAL LOW (ref 3.5–5.0)
Alkaline Phosphatase: 137 U/L — ABNORMAL HIGH (ref 38–126)
Anion gap: 7 (ref 5–15)
BUN: 18 mg/dL (ref 6–20)
CO2: 18 mmol/L — ABNORMAL LOW (ref 22–32)
Calcium: 8.3 mg/dL — ABNORMAL LOW (ref 8.9–10.3)
Chloride: 109 mmol/L (ref 98–111)
Creatinine, Ser: 0.84 mg/dL (ref 0.44–1.00)
GFR calc Af Amer: >60 mL/min (ref >60)
GFR calc non Af Amer: >60 mL/min (ref >60)
Glucose, Bld: 91 mg/dL (ref 70–99)
Potassium: 4.1 mmol/L (ref 3.5–5.1)
Sodium: 134 mmol/L — ABNORMAL LOW (ref 135–145)
Total Bilirubin: 0.8 mg/dL (ref 0.3–1.2)
Total Protein: 6.7 g/dL (ref 6.5–8.1)

## 2019-05-28 LAB — CBC
HCT: 22.8 % — ABNORMAL LOW (ref 36.0–46.0)
Hemoglobin: 7.1 g/dL — ABNORMAL LOW (ref 12.0–15.0)
MCH: 30.6 pg (ref 26.0–34.0)
MCHC: 31.1 g/dL (ref 30.0–36.0)
MCV: 98.3 fL (ref 80.0–100.0)
Platelets: 390 10*3/uL (ref 150–400)
RBC: 2.32 MIL/uL — ABNORMAL LOW (ref 3.87–5.11)
RDW: 15.1 % (ref 11.5–15.5)
WBC: 11.5 10*3/uL — ABNORMAL HIGH (ref 4.0–10.5)
nRBC: 0 % (ref 0.0–0.2)

## 2019-05-28 LAB — GLUCOSE, CAPILLARY
Glucose-Capillary: 72 mg/dL (ref 70–99)
Glucose-Capillary: 84 mg/dL (ref 70–99)
Glucose-Capillary: 84 mg/dL (ref 70–99)
Glucose-Capillary: 87 mg/dL (ref 70–99)
Glucose-Capillary: 88 mg/dL (ref 70–99)
Glucose-Capillary: 98 mg/dL (ref 70–99)

## 2019-05-28 MED ORDER — DEXTROSE 10 % IV SOLN: INTRAVENOUS | Status: AC

## 2019-05-28 NOTE — Progress Notes (Signed)
PROGRESS NOTE    Gina [name]  YBO:[id number] DOB: [birth year] DOA: 05/22/2019 PCP: [name], MD   Brief Narrative: 59 year old female with DM, HTN, schizophrenia, bilateral BKA sent from [facility] due to positive blood culture.  Recent admission November 2020 for perforated prepyloric ulcer with septic shock with acute peritonitis status post exlap with [name] patch on 11/23, and was discharged to select LT[facility] on Unasyn Bactrim and antifungal for total 6 weeks.  Patient was discharged with Foley catheter, PICC line, bilateral lower quadrant drains along with enterocutaneous fistula with colostomy bag . She was discharged from [facility] to [facility] whiere she has been living for last 3 months. Patient  was sent to ER for vague sleepiness malaise anorexia low-grade fever and leukocytosis at SNF, and in the ER exam labs unremarkable and patient was sent back to SNF but overnight blood culture returned MRSA 2/2 and she was readmitted to the hospital.  Patient is being followed by infectious disease, general surgery.She did well with speech, diet was restarted, PICC line was removed as no longer needing TPN and also needing line holidays given her MRSA bacteremia.  Foley catheter was exchanged.  Repeat blood culture negative from 3/2.  ID has advised to continue vancomycin through 3/25 and ordered PICC line 3/17. Surgery has been following patient and on calorie count, her intestinal obstruction is not reliable so if it is not ideal and if not able to meet calorie count will likely need to go back on TPN.  Patient and family planning to return home with home health services upon discharge and has declined skilled nursing facility placement.    Subjective: Reports she is feeling better today.  No nausea vomiting fevers chills.  Diet intake has been poor and on calorie count.  Assessment & Plan:  MRSA bacteremia: indwelling PICC line used for TPN is felt likely source and PICC is out. Appreciate  ID on board. For TEE 3/16. Repeat blood cultures 3/12- Negative so far.TTE negative for endocarditis.  Noted ID plan for PICC line placement today and vancomycin through 3/25.   Intra-abdominal fluid collection with recent ex lap/partial small bowel resection and intra-abdominal abscess in November.General surgery following, right-sided drain came up 3/13 and surgery aware and dressing in place.enterocutaneous fistula in placement w/ liquid bowel movement..  Moderate protein-calorie malnutrition: was TPN dependent, dietitian on consult.  Now on oral diet and calorie count-surgery planning to address with TPN if unable to meet her requirement, as her intestinal obstruction is questionable and not ideal candidate for tube feeding. Await final surgery plan for dispo  Bilateral stump wounds continue wound care and dressing.  Recent admission November 2020 for perforated prepyloric ulcer with septic shock with acute peritonitis status post exlap with [name] patch on 11/23-patient undergoing bilateral lower quadrant drain, enterocutaneous fistula with colostomy bag.  Upper GI shows fistulous connection at 2 points in the small bowel (proximal small bowel, suspect 1st site of communication is proximal jejunum) connecting to the known collection in the anterior abdomen and io the skin.  May need drain injection to further characterize fistula in the future as well as EGD per surgery and will need to follow-up as outpatient  HTN:BP well controlled, cont amlodipine.  Metabolic acidosis/elevated BUN/dehydration: Tolerating diet slowly wean off IV fluids, bicarb is low suspect due to her fistula. BUN has normalized from 46-->14. She is off ivf.   Diabetes mellitus: hbA1c 5.9 3/12.  With hypoglycemia blood sugar 67 3/12, off lantus and on ssi. sugar  stable. Recent Labs  Lab 05/27/19 1949 05/28/19 0026 05/28/19 0438 05/28/19 0737 05/28/19 1153  GLUCAP 78 98 84 84 87   Anemia of chronic disease: Previously  hemoglobin has been ranging from 9 to 8 g, and drifting downward.  Improved to 7.5 g. Suspect multifactorial with chronic disease, no obvious bleeding present. HB low at 7.0., anemia panel ferritin 646, iron low at 22, B12 750, folate 22. Hemoccult was negative. Recent Labs  Lab 05/24/19 0747 05/25/19 0632 05/26/19 0354 05/27/19 0429 05/28/19 0152  HGB 7.0* 7.1* 7.5* 7.5* 7.1*  HCT 22.1* 22.4* 24.1* 23.8* 22.8*    Below-knee amputee/Atherosclerosis of native arteries: Stump evaluation by wound care for her ilateral stump wounds   Transaminitis: Suspect from TPN.  Monitor closely.  Asymptomatic bacteriuria: replaceD Foley after admission.  GERD: on PPI  Mood disorder:on Celexa  Goals of care: Palliative care consulted patient is DNR MOST form filled.Per recommendation to treat what is treatable and plan on palliative care referral upon discharge.Given significant comorbidities malnutrition patient is at high risk for readmission and decompensation.  Interventions: Refer to RD note for recommendations Body mass index is 21.37 kg/m.  Pressure Ulcer: Pressure Injury 05/22/19 Coccyx Bilateral Stage 2 -  Partial thickness loss of dermis presenting as a shallow open injury with a red, pink wound bed without slough. (Active)  05/22/19 2104  Location: Coccyx  Location Orientation: Bilateral  Staging: Stage 2 -  Partial thickness loss of dermis presenting as a shallow open injury with a red, pink wound bed without slough.  Wound Description (Comments):   Present on Admission: Yes     Pressure Injury 05/22/19 Knee Right;Distal Unstageable - Full thickness tissue loss in which the base of the injury is covered by slough (yellow, tan, gray, green or brown) and/or eschar (tan, brown or black) in the wound bed. Stump (Active)  05/22/19 2106  Location: Knee  Location Orientation: Right;Distal  Staging: Unstageable - Full thickness tissue loss in which the base of the injury is covered by  slough (yellow, tan, gray, green or brown) and/or eschar (tan, brown or black) in the wound bed.  Wound Description (Comments): Stump  Present on Admission: Yes     Pressure Injury 05/22/19 Knee Left;Distal Unstageable - Full thickness tissue loss in which the base of the injury is covered by slough (yellow, tan, gray, green or brown) and/or eschar (tan, brown or black) in the wound bed. Stump (Active)  05/22/19 2108  Location: Knee  Location Orientation: Left;Distal  Staging: Unstageable - Full thickness tissue loss in which the base of the injury is covered by slough (yellow, tan, gray, green or brown) and/or eschar (tan, brown or black) in the wound bed.  Wound Description (Comments): Stump  Present on Admission: Yes     Pressure Injury 05/22/19 Thigh Left;Mid Stage 2 -  Partial thickness loss of dermis presenting as a shallow open injury with a red, pink wound bed without slough. (Active)  05/22/19 2109  Location: Thigh  Location Orientation: Left;Mid  Staging: Stage 2 -  Partial thickness loss of dermis presenting as a shallow open injury with a red, pink wound bed without slough.  Wound Description (Comments):   Present on Admission: Yes   DVT prophylaxis:lovenox Code Status: DNR Family Communication: plan of care discussed with patient at bedside.  Disposition Plan: Patient is from: SNF Anticipated Disposition: to home with home and services.  Patient reports she is not going back to skilled nursing facility.   Barriers to  discharge or conditions that needs to be met prior to discharge: Patient remains hospitalized for MRSA bacteremia treatment, and on calorie per severe protein calorie malnutrition to see if patient needs to be placed back on TPN for discharge home, await for final surgery plan for disposition. Hopefully home soon.   Consultants: Infectious disease, general surgery Procedures:  2D ECHO 1. Left ventricular ejection fraction, by estimation, is 55 to 60%. The    left ventricle has normal function. The left ventricle has no regional  wall motion abnormalities. Left ventricular diastolic parameters were  normal.  2. Right ventricular systolic function is normal. The right ventricular  size is normal.  3. The mitral valve is normal in structure. No evidence of mitral valve  regurgitation. No evidence of mitral stenosis.  4. The aortic valve is normal in structure. Aortic valve regurgitation is  not visualized. No aortic stenosis is present.   Microbiology: Blood culture with MRSA 05/21/19  Medications: Scheduled Meds: . amLODipine  5 mg Oral Daily  . chlorhexidine  15 mL Mouth Rinse BID  . Chlorhexidine Gluconate Cloth  6 each Topical Daily  . enoxaparin (LOVENOX) injection  40 mg Subcutaneous Q24H  . escitalopram  10 mg Oral Daily  . feeding supplement (ENSURE ENLIVE)  237 mL Oral TID BM  . feeding supplement (PRO-STAT SUGAR FREE 64)  30 mL Oral BID WC  . gabapentin  300 mg Oral BID  . insulin aspart  0-9 Units Subcutaneous Q6H  . mouth rinse  15 mL Mouth Rinse q12n4p  . Melatonin  3 mg Oral QHS  . metoprolol tartrate  50 mg Oral BID  . multivitamin with minerals  1 tablet Oral Daily  . nystatin  5 mL Oral QID  . pantoprazole  40 mg Oral Daily  . simethicone  80 mg Oral QID   Continuous Infusions: . vancomycin 1,000 mg (05/27/19 2214)    Antimicrobials: Anti-infectives (From admission, onward)   Start     Dose/Rate Route Frequency Ordered Stop   05/25/19 2200  vancomycin (VANCOCIN) IVPB 1000 mg/200 mL premix     1,000 mg 200 mL/hr over 60 Minutes Intravenous Every 24 hours 05/25/19 0958     05/24/19 2200  vancomycin (VANCOREADY) IVPB 750 mg/150 mL  Status:  Discontinued     750 mg 150 mL/hr over 60 Minutes Intravenous Every 24 hours 05/24/19 1156 05/25/19 0958   05/23/19 2200  vancomycin (VANCOCIN) IVPB 1000 mg/200 mL premix     1,000 mg 200 mL/hr over 60 Minutes Intravenous  Once 05/23/19 1345 05/24/19 0008   05/23/19 1113   vancomycin variable dose per unstable renal function (pharmacist dosing)  Status:  Discontinued      Does not apply See admin instructions 05/23/19 1114 05/24/19 1156   05/22/19 1500  vancomycin (VANCOREADY) IVPB 1250 mg/250 mL  Status:  Discontinued     1,250 mg 166.7 mL/hr over 90 Minutes Intravenous Every 24 hours 05/22/19 1429 05/23/19 1114     Objective: Vitals: Today's Vitals   05/28/19 0953 05/28/19 1100 05/28/19 1157 05/28/19 1212  BP: (!) 104/52  (!) 120/59   Pulse: 71  79   Resp: 18  18   Temp: 98.6 F (37 C)  99.1 F (37.3 C)   TempSrc: Oral  Oral   SpO2: 92%  93%   Weight:      Height:      PainSc:  7   Asleep    Intake/Output Summary (Last 24 hours) at 05/28/2019 1612  Last data filed at 05/28/2019 1300 Gross per 24 hour  Intake 900 ml  Output 800 ml  Net 100 ml   Filed Weights   05/25/19 2034 05/27/19 1140 05/28/19 0438  Weight: 54.6 kg 54.6 kg 53 kg   Weight change:    Intake/Output from previous day: 03/16 0701 - 03/17 0700 In: 780 [P.O.:780] Out: 900 [Urine:900] Intake/Output this shift: Total I/O In: 420 [P.O.:420] Out: 350 [Stool:350]  Examination:  General exam: Alert awake oriented, thin and frail.   HEENT:Oral mucosa moist, Ear/Nose WNL grossly,dentition normal. Respiratory system: b/l clearno use of accessory muscle, non tender. Cardiovascular system: S1 & S2 +, regular, No JVD. Gastrointestinal system: Abdomen soft, right lower quadrant with dressing present, EC fistula with liquidy stool and LLQ drain intact. Nervous System:Alert, awake, moving extremities and grossly nonfocal Extremities: No edema, distal peripheral pulses palpable.  Skin: No rashes,no icterus. MSK: BKA bilaterally with a stump with wounds.    Data Reviewed: I have personally reviewed following labs and imaging studies CBC: Recent Labs  Lab 05/24/19 0747 05/25/19 0632 05/26/19 0354 05/27/19 0429 05/28/19 0152  WBC 8.3 8.4 8.1 7.7 11.5*  HGB 7.0* 7.1* 7.5* 7.5*  7.1*  HCT 22.1* 22.4* 24.1* 23.8* 22.8*  MCV 94.4 94.1 96.4 96.7 98.3  PLT 226 260 300 344 904   Basic Metabolic Panel: Recent Labs  Lab 05/24/19 0522 05/25/19 0632 05/26/19 0354 05/27/19 0429 05/28/19 0152  NA 135 134* 136 136 134*  K 4.7 3.8 3.7 3.8 4.1  CL 106 105 108 109 109  CO2 19* 21* 17* 19* 18*  GLUCOSE 90 124* 139* 82 91  BUN 38* 24* _0 CREATININE 1.01* 0.84 0.83 0.71 0.84  CALCIUM 8.0* 8.1* 8.3* 8.2* 8.3*   GFR: Estimated Creatinine Clearance: 57.7 mL/min (by C-G formula based on SCr of 0.84 mg/dL). Liver Function Tests: Recent Labs  Lab 05/24/19 0522 05/25/19 7533 05/26/19 0354 05/27/19 0429 05/28/19 0152  AST 43* _1 ALT 35 _2 ALKPHOS 150* 154* 152* 135* 137*  BILITOT 1.8* 1.0 0.8 0.5 0.8  PROT 5.9* 6.5 6.9 6.4* 6.7  ALBUMIN 1.5* 1.5* 1.6* 1.5* 1.5*   No results for input(s): LIPASE, AMYLASE in the last 168 hours. No results for input(s): AMMONIA in the last 168 hours. Coagulation Profile: No results for input(s): INR, PROTIME in the last 168 hours. Cardiac Enzymes: No results for input(s): CKTOTAL, CKMB, CKMBINDEX, TROPONINI in the last 168 hours. BNP (last 3 results) No results for input(s): PROBNP in the last 8760 hours. HbA1C: No results for input(s): HGBA1C in the last 72 hours. CBG: Recent Labs  Lab 05/27/19 1949 05/28/19 0026 05/28/19 0438 05/28/19 0737 05/28/19 1153  GLUCAP 78 98 84 84 87   Lipid Profile: No results for input(s): CHOL, HDL, LDLCALC, TRIG, CHOLHDL, LDLDIRECT in the last 72 hours. Thyroid Function Tests: No results for input(s): TSH, T4TOTAL, FREET4, T3FREE, THYROIDAB in the last 72 hours. Anemia Panel: No results for input(s): VITAMINB12, FOLATE, FERRITIN, TIBC, IRON, RETICCTPCT in the last 72 hours. Sepsis Labs: Recent Labs  Lab 05/22/19 1633  LATICACIDVEN 1.2    Recent Results (from the past 240 hour(s))  Urine Culture     Status: Abnormal   Collection Time: 05/21/19 10:00 AM    Specimen: Urine, Catheterized  Result Value Ref Range Status   Specimen Description URINE, CATHETERIZED  Final   Special Requests   Final    NONE Performed at [facility], [place] [address] [place], [place] [address]    Culture (A)  Final    >=100,000 COLONIES/mL PSEUDOMONAS AERUGINOSA 50,000 COLONIES/mL VANCOMYCIN RESISTANT ENTEROCOCCUS ISOLATED    Report Status 05/24/2019 FINAL  Final   Organism ID, Bacteria PSEUDOMONAS AERUGINOSA (A)  Final   Organism ID, Bacteria VANCOMYCIN RESISTANT ENTEROCOCCUS ISOLATED (A)  Final      Susceptibility   Pseudomonas aeruginosa - MIC*    CEFTAZIDIME 4 SENSITIVE Sensitive     CIPROFLOXACIN <=0.25 SENSITIVE Sensitive     GENTAMICIN <=1 SENSITIVE Sensitive     IMIPENEM 1 SENSITIVE Sensitive     PIP/TAZO <=4 SENSITIVE Sensitive     CEFEPIME 2 SENSITIVE Sensitive     * >=100,000 COLONIES/mL PSEUDOMONAS AERUGINOSA   Vancomycin resistant enterococcus isolated - MIC*    AMPICILLIN >=32 RESISTANT Resistant     NITROFURANTOIN 256 RESISTANT Resistant     VANCOMYCIN >=32 RESISTANT Resistant     LINEZOLID 2 SENSITIVE Sensitive     * 50,000 COLONIES/mL VANCOMYCIN RESISTANT ENTEROCOCCUS ISOLATED  Respiratory Panel by RT PCR (Flu A&B, Covid) - Nasopharyngeal Swab     Status: None   Collection Time: 05/21/19 10:03 AM   Specimen: Nasopharyngeal Swab  Result Value Ref Range Status   SARS Coronavirus 2 by RT PCR NEGATIVE NEGATIVE Final    Comment: (NOTE) SARS-CoV-2 target nucleic acids are NOT DETECTED. The SARS-CoV-2 RNA is generally detectable in upper respiratoy specimens during the acute phase of infection. The lowest concentration of SARS-CoV-2 viral copies this assay can detect is 131 copies/mL. A negative result does not preclude SARS-Cov-2 infection and should not be used as the sole basis for treatment or other patient management decisions. A negative result may occur with  improper specimen collection/handling, submission of specimen  other than nasopharyngeal swab, presence of viral mutation(s) within the areas targeted by this assay, and inadequate number of viral copies (<131 copies/mL). A negative result must be combined with clinical observations, patient history, and epidemiological information. The expected result is Negative. Fact Sheet for Patients:  PinkCheek.be Fact Sheet for Healthcare Providers:  GravelBags.it This test is not yet ap proved or cleared by the [place] FDA and  has been authorized for detection and/or diagnosis of SARS-CoV-2 by FDA under an Emergency Use Authorization (EUA). This EUA will remain  in effect (meaning this test can be used) for the duration of the COVID-19 declaration under Section 564(b)(1) of the Act, 21 U.S.C. section 360bbb-3(b)(1), unless the authorization is terminated or revoked sooner.    Influenza A by PCR NEGATIVE NEGATIVE Final   Influenza B by PCR NEGATIVE NEGATIVE Final    Comment: (NOTE) The Xpert Xpress SARS-CoV-2/FLU/RSV assay is intended as an aid in  the diagnosis of influenza from Nasopharyngeal swab specimens and  should not be used as a sole basis for treatment. Nasal washings and  aspirates are unacceptable for Xpert Xpress SARS-CoV-2/FLU/RSV  testing. Fact Sheet for Patients: PinkCheek.be Fact Sheet for Healthcare Providers: GravelBags.it This test is not yet approved or cleared by the [place] FDA and  has been authorized for detection and/or diagnosis of SARS-CoV-2 by  FDA under an Emergency Use Authorization (EUA). This EUA will remain  in effect (meaning this test can be used) for the duration of the  Covid-19 declaration under Section 564(b)(1) of the Act, 21  U.S.C. section 360bbb-3(b)(1), unless the authorization is  terminated or revoked. Performed at [facility], [place] [address] [place], [place] [address]  Blood culture (routine x 2)     Status: Abnormal   Collection Time: 05/21/19 10:38 AM   Specimen: BLOOD RIGHT ARM  Result Value Ref Range Status   Specimen Description BLOOD RIGHT ARM  Final   Special Requests   Final    AEROBIC BOTTLE ONLY Blood Culture results may not be optimal due to an inadequate volume of blood received in culture bottles   Culture  Setup Time   Final    GRAM POSITIVE COCCI IN CLUSTERS AEROBIC BOTTLE ONLY CRITICAL RESULT CALLED TO, READ BACK BY AND VERIFIED WITH: Macky Lower RN, AT [phone] 05/22/19 BY Rush Landmark Performed at [facility], [place] [address], [place], [place] [address]    Culture METHICILLIN RESISTANT STAPHYLOCOCCUS AUREUS (A)  Final   Report Status 05/24/2019 FINAL  Final   Organism ID, Bacteria METHICILLIN RESISTANT STAPHYLOCOCCUS AUREUS  Final      Susceptibility   Methicillin resistant staphylococcus aureus - MIC*    CIPROFLOXACIN >=8 RESISTANT Resistant     ERYTHROMYCIN <=0.25 SENSITIVE Sensitive     GENTAMICIN <=0.5 SENSITIVE Sensitive     OXACILLIN >=4 RESISTANT Resistant     TETRACYCLINE <=1 SENSITIVE Sensitive     VANCOMYCIN 1 SENSITIVE Sensitive     TRIMETH/SULFA <=10 SENSITIVE Sensitive     CLINDAMYCIN <=0.25 SENSITIVE Sensitive     RIFAMPIN <=0.5 SENSITIVE Sensitive     Inducible Clindamycin NEGATIVE Sensitive     * METHICILLIN RESISTANT STAPHYLOCOCCUS AUREUS  Blood culture (routine x 2)     Status: Abnormal   Collection Time: 05/21/19 10:38 AM   Specimen: BLOOD RIGHT HAND  Result Value Ref Range Status   Specimen Description BLOOD RIGHT HAND  Final   Special Requests   Final    AEROBIC BOTTLE ONLY Blood Culture results may not be optimal due to an inadequate volume of blood received in culture bottles   Culture  Setup Time   Final    AEROBIC BOTTLE ONLY GRAM POSITIVE COCCI IN CLUSTERS CRITICAL RESULT CALLED TO, READ BACK BY AND VERIFIED WITH: K NEAL RN 05/22/19 0137 JDW    Culture (A)  Final    STAPHYLOCOCCUS  AUREUS SUSCEPTIBILITIES PERFORMED ON PREVIOUS CULTURE WITHIN THE LAST 5 DAYS. Performed at [facility], [place] [address] [place], [place] [address]    Report Status 05/24/2019 FINAL  Final  Blood Culture ID Panel (Reflexed)     Status: Abnormal   Collection Time: 05/21/19 10:38 AM  Result Value Ref Range Status   Enterococcus species NOT DETECTED NOT DETECTED Final   Listeria monocytogenes NOT DETECTED NOT DETECTED Final   Staphylococcus species DETECTED (A) NOT DETECTED Final    Comment: CRITICAL RESULT CALLED TO, READ BACK BY AND VERIFIED WITH: S. COBLE RN, AT [phone] 05/22/19 BY D. VANHOOK    Staphylococcus aureus (BCID) DETECTED (A) NOT DETECTED Final    Comment: Methicillin (oxacillin)-resistant Staphylococcus aureus (MRSA). MRSA is predictably resistant to beta-lactam antibiotics (except ceftaroline). Preferred therapy is vancomycin unless clinically contraindicated. Patient requires contact precautions if  hospitalized. CRITICAL RESULT CALLED TO, READ BACK BY AND VERIFIED WITH: S. COBLE RN, AT [phone] 05/22/19 BY D. VANHOOK    Methicillin resistance DETECTED (A) NOT DETECTED Final    Comment: CRITICAL RESULT CALLED TO, READ BACK BY AND VERIFIED WITH: S. COBLE RN, AT [phone] 05/22/19 BY D. VANHOOK    Streptococcus species NOT DETECTED NOT DETECTED Final   Streptococcus agalactiae NOT DETECTED NOT DETECTED Final   Streptococcus pneumoniae NOT  DETECTED NOT DETECTED Final   Streptococcus pyogenes NOT DETECTED NOT DETECTED Final   Acinetobacter baumannii NOT DETECTED NOT DETECTED Final   Enterobacteriaceae species NOT DETECTED NOT DETECTED Final   Enterobacter cloacae complex NOT DETECTED NOT DETECTED Final   Escherichia coli NOT DETECTED NOT DETECTED Final   Klebsiella oxytoca NOT DETECTED NOT DETECTED Final   Klebsiella pneumoniae NOT DETECTED NOT DETECTED Final   Proteus species NOT DETECTED NOT DETECTED Final   Serratia marcescens NOT DETECTED NOT DETECTED Final   Haemophilus  influenzae NOT DETECTED NOT DETECTED Final   Neisseria meningitidis NOT DETECTED NOT DETECTED Final   Pseudomonas aeruginosa NOT DETECTED NOT DETECTED Final   Candida albicans NOT DETECTED NOT DETECTED Final   Candida glabrata NOT DETECTED NOT DETECTED Final   Candida krusei NOT DETECTED NOT DETECTED Final   Candida parapsilosis NOT DETECTED NOT DETECTED Final   Candida tropicalis NOT DETECTED NOT DETECTED Final    Comment: Performed at [facility], [place] [address], [place], [place] [address]  SARS CORONAVIRUS 2 (TAT 6-24 HRS) Nasopharyngeal Nasopharyngeal Swab     Status: None   Collection Time: 05/22/19  2:45 PM   Specimen: Nasopharyngeal Swab  Result Value Ref Range Status   SARS Coronavirus 2 NEGATIVE NEGATIVE Final    Comment: (NOTE) SARS-CoV-2 target nucleic acids are NOT DETECTED. The SARS-CoV-2 RNA is generally detectable in upper and lower respiratory specimens during the acute phase of infection. Negative results do not preclude SARS-CoV-2 infection, do not rule out co-infections with other pathogens, and should not be used as the sole basis for treatment or other patient management decisions. Negative results must be combined with clinical observations, patient history, and epidemiological information. The expected result is Negative. Fact Sheet for Patients: SugarRoll.be Fact Sheet for Healthcare Providers: https://www.woods-mathews.com/ This test is not yet approved or cleared by the [place] FDA and  has been authorized for detection and/or diagnosis of SARS-CoV-2 by FDA under an Emergency Use Authorization (EUA). This EUA will remain  in effect (meaning this test can be used) for the duration of the COVID-19 declaration under Section 56 4(b)(1) of the Act, 21 U.S.C. section 360bbb-3(b)(1), unless the authorization is terminated or revoked sooner. Performed at [facility], [place] [address], [place],  [place] [address]   Culture, blood (routine x 2)     Status: None   Collection Time: 05/23/19  5:34 AM   Specimen: BLOOD  Result Value Ref Range Status   Specimen Description BLOOD LEFT ARM  Final   Special Requests   Final    BOTTLES DRAWN AEROBIC AND ANAEROBIC Blood Culture results may not be optimal due to an excessive volume of blood received in culture bottles   Culture   Final    NO GROWTH 5 DAYS Performed at [facility], [place] [address], [place], [place] [address]    Report Status 05/28/2019 FINAL  Final  Culture, blood (routine x 2)     Status: None   Collection Time: 05/23/19  5:42 AM   Specimen: BLOOD  Result Value Ref Range Status   Specimen Description BLOOD LEFT HAND  Final   Special Requests   Final    BOTTLES DRAWN AEROBIC AND ANAEROBIC Blood Culture adequate volume   Culture   Final    NO GROWTH 5 DAYS Performed at [facility], [place] [address], [place],  [address]    Report Status 05/28/2019 FINAL  Final  Radiology Studies: ECHO TEE  Result Date: 05/27/2019    TRANSESOPHOGEAL ECHO REPORT   Patient Name:   Gina [name] Date of Exam: 05/27/2019 Medical Rec #:  [id number]       Height:       62.0 in Accession #:    [id number]      Weight:       120.4 lb Date of Birth:  [birth year]        BSA:          1.541 m Patient Age:    60 years        BP:           90/41 mmHg Patient Gender: F               HR:           62 bpm. Exam Location:  Inpatient Procedure: Transesophageal Echo, Cardiac Doppler and Color Doppler Indications:     Bacteremia  History:         Patient has prior history of Echocardiogram examinations, most                  recent 05/23/2019. Signs/Symptoms:Fever; Risk Factors:Diabetes                  and Hypertension. MRSA                  Septic shock.  Sonographer:     Vikki Ports Turrentine Referring Phys:  [id number] Abigail Butts Diagnosing Phys: Skeet Latch MD PROCEDURE: The transesophogeal probe was passed without difficulty through the  esophogus of the patient. Sedation performed by different physician. The patient's vital signs; including heart rate, blood pressure, and oxygen saturation; remained stable throughout the procedure. The patient developed no complications during the procedure. IMPRESSIONS  1. Left ventricular ejection fraction, by estimation, is 55 to 60%. The left ventricle has normal function. The left ventricle has no regional wall motion abnormalities. Left ventricular diastolic function could not be evaluated.  2. Right ventricular systolic function is normal. The right ventricular size is normal.  3. No left atrial/left atrial appendage thrombus was detected.  4. The mitral valve is normal in structure. Mild mitral valve regurgitation. No evidence of mitral stenosis.  5. The aortic valve is tricuspid. Aortic valve regurgitation is not visualized. No aortic stenosis is present. Conclusion(s)/Recommendation(s): Normal biventricular function without evidence of hemodynamically significant valvular heart disease. FINDINGS  Left Ventricle: Left ventricular ejection fraction, by estimation, is 55 to 60%. The left ventricle has normal function. The left ventricle has no regional wall motion abnormalities. The left ventricular internal cavity size was normal in size. There is  no left ventricular hypertrophy. Left ventricular diastolic function could not be evaluated. Right Ventricle: The right ventricular size is normal. No increase in right ventricular wall thickness. Right ventricular systolic function is normal. Left Atrium: Left atrial size was normal in size. No left atrial/left atrial appendage thrombus was detected. Right Atrium: Right atrial size was normal in size. Pericardium: Trivial pericardial effusion is present. Mitral Valve: The mitral valve is normal in structure. Normal mobility of the mitral valve leaflets. Mild mitral valve regurgitation. No evidence of mitral valve stenosis. Tricuspid Valve: The tricuspid valve is  normal in structure. Tricuspid valve regurgitation is mild . No evidence of tricuspid stenosis. Aortic Valve: The aortic valve is tricuspid. Aortic valve regurgitation is not visualized. No aortic stenosis is present. Pulmonic Valve: The pulmonic valve was normal in structure. Pulmonic valve  regurgitation is trivial. No evidence of pulmonic stenosis. Aorta: The aortic root is normal in size and structure. IAS/Shunts: No atrial level shunt detected by color flow Doppler.  TRICUSPID VALVE TR Peak grad:   49.0 mmHg TR Vmax:        350.00 cm/s Skeet Latch MD Electronically signed by Skeet Latch MD Signature Date/Time: 05/27/2019/5:12:39 PM    Final    [place] EKG SITE RITE  Result Date: 05/28/2019 If Site Rite image not attached, placement could not be confirmed due to current cardiac rhythm.   LOS: 6 days  Time spent: More than 50% of that time was spent in counseling and/or coordination of care. Antonieta Pert, MD [facility]  05/28/2019, 4:12 PM

## 2019-05-28 NOTE — Progress Notes (Addendum)
[facility] Progress Note  1 Day Post-Op  Subjective: CC-  Feeling better today. Denies any current abdominal pain, n/v. Tolerating diet but not eating much.  Output from ECF or drain not recorded yesterday. Patient states that Lynann Beaver did leak last night and have to be replaced. Per ID patient will be on IV vancomycin through 3/25, PICC has been ordered to be replaced.  Objective: Vital signs in last 24 hours: Temp:  [97.5 F (36.4 C)-98.6 F (37 C)] 98.4 F (36.9 C) (03/17 0438) Pulse Rate:  [62-71] 71 (03/17 0438) Resp:  [11-20] 16 (03/17 0438) BP: (90-154)/(41-78) 117/63 (03/17 0438) SpO2:  [88 %-98 %] 94 % (03/17 0438) Weight:  [53 kg-54.6 kg] 53 kg (03/17 0438) Last BM Date: 05/27/19  Intake/Output from previous day: 03/16 0701 - 03/17 0700 In: 780 [P.O.:780] Out: 900 [Urine:900] Intake/Output this shift: No intake/output data recorded.  PE: Gen: Alert, NAD, pleasant Pulm: rate and effort normal YKD:XIPJ, nontender, nondistended, E[place] in place over fistula with GI contents in bag, IR drainalso with enteric contents, previous drain site RUQ cdi with no active drainage or erythema Ext:S/p BKA Skin: warm and dry  Lab Results:  Recent Labs    05/27/19 0429 05/28/19 0152  WBC 7.7 11.5*  HGB 7.5* 7.1*  HCT 23.8* 22.8*  PLT 344 390   BMET Recent Labs    05/27/19 0429 05/28/19 0152  NA 136 134*  K 3.8 4.1  CL 109 109  CO2 19* 18*  GLUCOSE 82 91  BUN 14 18  CREATININE 0.71 0.84  CALCIUM 8.2* 8.3*   PT/INR No results for input(s): LABPROT, INR in the last 72 hours. CMP     Component Value Date/Time   NA 134 (L) 05/28/2019 0152   NA 139 10/04/2017 1705   K 4.1 05/28/2019 0152   CL 109 05/28/2019 0152   CO2 18 (L) 05/28/2019 0152   GLUCOSE 91 05/28/2019 0152   BUN 18 05/28/2019 0152   BUN 11 10/04/2017 1705   CREATININE 0.84 05/28/2019 0152   CREATININE 0.68 04/28/2016 1640   CALCIUM 8.3 (L) 05/28/2019 0152   PROT 6.7  05/28/2019 0152   PROT 7.1 10/04/2017 1705   ALBUMIN 1.5 (L) 05/28/2019 0152   ALBUMIN 3.7 10/04/2017 1705   AST 15 05/28/2019 0152   ALT 17 05/28/2019 0152   ALKPHOS 137 (H) 05/28/2019 0152   BILITOT 0.8 05/28/2019 0152   BILITOT <0.2 10/04/2017 1705   GFRNONAA >60 05/28/2019 0152   GFRNONAA >89 04/28/2016 1640   GFRAA >60 05/28/2019 0152   GFRAA >89 04/28/2016 1640   Lipase     Component Value Date/Time   LIPASE 27 04/01/2016 1040       Studies/Results: ECHO TEE  Result Date: 05/27/2019    TRANSESOPHOGEAL ECHO REPORT   Patient Name:   Gina [name] Date of Exam: 05/27/2019 Medical Rec #:  [id number]       Height:       62.0 in Accession #:    [id number]      Weight:       120.4 lb Date of Birth:  [birth year]        BSA:          1.541 m Patient Age:    59 years        BP:           90/41 mmHg Patient Gender: F               HR:  62 bpm. Exam Location:  Inpatient Procedure: Transesophageal Echo, Cardiac Doppler and Color Doppler Indications:     Bacteremia  History:         Patient has prior history of Echocardiogram examinations, most                  recent 05/23/2019. Signs/Symptoms:Fever; Risk Factors:Diabetes                  and Hypertension. MRSA                  Septic shock.  Sonographer:     Vikki Ports Turrentine Referring Phys:  [id number] Abigail Butts Diagnosing Phys: Skeet Latch MD PROCEDURE: The transesophogeal probe was passed without difficulty through the esophogus of the patient. Sedation performed by different physician. The patient's vital signs; including heart rate, blood pressure, and oxygen saturation; remained stable throughout the procedure. The patient developed no complications during the procedure. IMPRESSIONS  1. Left ventricular ejection fraction, by estimation, is 55 to 60%. The left ventricle has normal function. The left ventricle has no regional wall motion abnormalities. Left ventricular diastolic function could not be evaluated.  2. Right  ventricular systolic function is normal. The right ventricular size is normal.  3. No left atrial/left atrial appendage thrombus was detected.  4. The mitral valve is normal in structure. Mild mitral valve regurgitation. No evidence of mitral stenosis.  5. The aortic valve is tricuspid. Aortic valve regurgitation is not visualized. No aortic stenosis is present. Conclusion(s)/Recommendation(s): Normal biventricular function without evidence of hemodynamically significant valvular heart disease. FINDINGS  Left Ventricle: Left ventricular ejection fraction, by estimation, is 55 to 60%. The left ventricle has normal function. The left ventricle has no regional wall motion abnormalities. The left ventricular internal cavity size was normal in size. There is  no left ventricular hypertrophy. Left ventricular diastolic function could not be evaluated. Right Ventricle: The right ventricular size is normal. No increase in right ventricular wall thickness. Right ventricular systolic function is normal. Left Atrium: Left atrial size was normal in size. No left atrial/left atrial appendage thrombus was detected. Right Atrium: Right atrial size was normal in size. Pericardium: Trivial pericardial effusion is present. Mitral Valve: The mitral valve is normal in structure. Normal mobility of the mitral valve leaflets. Mild mitral valve regurgitation. No evidence of mitral valve stenosis. Tricuspid Valve: The tricuspid valve is normal in structure. Tricuspid valve regurgitation is mild . No evidence of tricuspid stenosis. Aortic Valve: The aortic valve is tricuspid. Aortic valve regurgitation is not visualized. No aortic stenosis is present. Pulmonic Valve: The pulmonic valve was normal in structure. Pulmonic valve regurgitation is trivial. No evidence of pulmonic stenosis. Aorta: The aortic root is normal in size and structure. IAS/Shunts: No atrial level shunt detected by color flow Doppler.  TRICUSPID VALVE TR Peak grad:   49.0  mmHg TR Vmax:        350.00 cm/s Skeet Latch MD Electronically signed by Skeet Latch MD Signature Date/Time: 05/27/2019/5:12:39 PM    Final     Anti-infectives: Anti-infectives (From admission, onward)   Start     Dose/Rate Route Frequency Ordered Stop   05/25/19 2200  vancomycin (VANCOCIN) IVPB 1000 mg/200 mL premix     1,000 mg 200 mL/hr over 60 Minutes Intravenous Every 24 hours 05/25/19 0958     05/24/19 2200  vancomycin (VANCOREADY) IVPB 750 mg/150 mL  Status:  Discontinued     750 mg 150 mL/hr over 60 Minutes Intravenous  Every 24 hours 05/24/19 1156 05/25/19 0958   05/23/19 2200  vancomycin (VANCOCIN) IVPB 1000 mg/200 mL premix     1,000 mg 200 mL/hr over 60 Minutes Intravenous  Once 05/23/19 1345 05/24/19 0008   05/23/19 1113  vancomycin variable dose per unstable renal function (pharmacist dosing)  Status:  Discontinued      Does not apply See admin instructions 05/23/19 1114 05/24/19 1156   05/22/19 1500  vancomycin (VANCOREADY) IVPB 1250 mg/250 mL  Status:  Discontinued     1,250 mg 166.7 mL/hr over 90 Minutes Intravenous Every 24 hours 05/22/19 1429 05/23/19 1114       Assessment/Plan HTN DM Schizophrenia Stress urinary incontinence Bilateral BKAs Left middle finger amputation Severe malnutrition - prealbumin7.1 (3/13) Code status DNR  MRSA bacteremia - per primary, PICC line out, on vancomycin. Going for TEE today  Perforated prepyloric ulcer S/pEXPLORATORY LAPAROTOMY WITH OVERSEW OF DUODENAL ULCER with [name] patch11/23/2020 Dr. Lucia Gaskins [facility] Fistula -JP surgical drainfell out3/14/21 and 1 IR drain placed 02/26/19 in place with enteric contents  - UGI3/12showed2 separate enterocutaneous fistulae - may need drain injection studies to further characterize fistulas in the future, as well as EGD - on CM diet, started calorie count 3/15 - continue strict I&O's of output from ECF and drain  ID - vancomycin 3/11>> VTE -SCDs, lovenox Foley -in  place Follow up -TBD  Plan: Discussed importance of I&O's from ECF and drain with nurse. Calorie count will be completed today, will follow up on results. I suspect Gina [name] is going to need some other source of nutrition. Will plan to restart TPN if this is the case, as Gina [name] likely would not absorb enough enterally with tube feedings with her proximal small bowel EC fistula.   LOS: 6 days    [facility] 05/28/2019, 8:57 AM Please see Amion for pager number during day hours 7:00am-4:30pm

## 2019-05-28 NOTE — Plan of Care (Signed)
  Problem: Education: Goal: Knowledge of General Education information will improve Description Including pain rating scale, medication(s)/side effects and non-pharmacologic comfort measures Outcome: Progressing   

## 2019-05-28 NOTE — Progress Notes (Signed)
PHARMACY - TOTAL PARENTERAL NUTRITION CONSULT NOTE   Indication: Fistula  Patient Measurements: Height: 5\' 2"  (157.5 cm) Weight: 116 lb 13.5 oz (53 kg) IBW/kg (Calculated) : 50.1 TPN AdjBW (KG): 54.6 Body mass index is 21.37 kg/m. 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  Sig hx of DM with bilateral BKA.  Presenting from SNF with MRSA bacteremia, suspected source from PICC.  Has been on oral diet this admission with line holiday however decreased appetite and may not be absorbing much per RD, unable to meet requirements.  Moderate malnutrition of chronic disease per RD assessment.     Glucose / Insulin: Hx DM: CBGs 70-80s, no insulin in last 24h, sSSI Electrolytes: Na 134, CoCa ok, K 4.1 Renal: SCr 0.84 LFTs / TGs: LFTs wnl, no recent TG Prealbumin / albumin: 7.1/1.5 Intake / Output; MIVF: UOP 0.7 ml/kg/hr, no IVF, net neg GI Imaging: Surgeries / Procedures:   Central access: (For PICC placement 3/17) TPN start date: 3/18  Nutritional Goals (per RD recommendation on 3/17): kCal: 1800-2000, Protein: 83-110g, Fluid: At least 1.9L  Goal TPN rate is 75 mL/hr   Current Nutrition:  Ensure Enlive TID (ea 350 kcal/20g AA) Prostat BID (ea 100 kcal/15g AA)  Plan:  TPN consult after 1200 cutoff for 3/17 orders, for PICC placement Start D10 IV @ 30 mL/hr until TPN start Continue sSSI, adjust as needed with TPN start Add TPN labs for AM F/u PICC placement, RD and surg updates  Bertis Ruddy, PharmD Clinical Pharmacist Please check AMION for all [place] numbers 05/28/2019 4:15 PM

## 2019-05-28 NOTE — Progress Notes (Addendum)
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  Ongoing  GOAL:   Patient will meet greater than or equal to 90% of their needs  Progressing   MONITOR:   PO intake, Supplement acceptance, Labs, Weight trends, Skin, I & O's  REASON FOR ASSESSMENT:   Consult Assessment of nutrition requirement/status, Calorie Count  ASSESSMENT:   59 yo female presents with malaise from [facility] and admitted with MRSA bacteremia. Pt admitted 01/2019 with septic shock and found to have acute peritonitis and underwent ex lap with partial SB resection and intra-abdominal abscess. Pt with SB EC fistula on TPNPMH includes DM, HTN, bilateral BKA, schizophrenia.  3/11- NGT removed 3/12- per general surgery notes, UGI reveals 2 separate EC fistulae 3/13- s/p MBSS- advanced to regular diet with thin liquids; JP drain d/c  3/16- s/p TEE- no evidence of endocarditis  Reviewed I/O's: -120 ml x 24 hours and -792 ml since admission  UOP: 900 ml x 24 hours  Spoke with pt at bedside, who was very tangential at time of visit. She shares with this RD that "this is my first time eating in a long time" and was NPO, TPN dependent from SNF PTA. Pt expresses desire "go home with my daughter and eat; I don't want that line in me".   Pt reports decreased appetite, which she  attributes to "getting used to eating again" and difficulty chewing food. Noted breakfast tray was untouched. Pt shares that she is missing multiple teeth and it is difficult to chew up her food; she is requesting a mechanical soft diet and is adamant that she will eat better and meet her needs if her diet order was downgraded. There are also many foods that pt reports she does not like or has pain with eating, such as magic cups, grits, and sodas, which pt attributes to not eating for so long. She is also drinking Ensure supplements, but requesting Glucerna due to previous experiences of hyperglycemia. RD explained rationale for Ensure order, especially given poor oral intake. Provided saltine crackers and grape juice per her request.   Pt endorses wt loss, but unsure when wt loss started. She reports UBW is between 150-160# and now is around 117#. Pt expressed concern about resuming TPN, as she believes this is what caused her to lose weight.   Discussed with pt importance of good meal and supplement intake to promote healing. General surgery considering resuming TPN; if pt desires aggressive care, would recommend resuming TPN, as it is difficult to tell how much pt is absorbing PO. Did not broach the topic of resuming TPN with pt at this time, as she was very tangential at time of visit. Palliative care consult also pending, which will be beneficial to assisting with goals of care.   ADDENDUM (1649): Per pharmacy note, plan to replace PICC and start TPN tomorrow (05/29/19).   05/26/19 Breakfast: 128 kcals, 7 grams protein Lunch: 211  kcals, 9 grams protein Dinner: 400 kcals, 9 grams protein Supplements: 1 Prostat, 2 Ensure supplements (800 kcals, 55 grams protein)  Total intake: 1530 kcal (86% of minimum estimated needs)  80 grams protein (96% of minimum estimated needs)  05/27/19 Breakfast: 0% (NPO for TEE), also 0% today (RD observed pt meal tray) Lunch: 265 kcals, 10 grams protein Dinner: 156  kcals, 10 grams protein Supplements: 1 Prostat, 1 Ensure supplement (450 kcals, 35 grams protein)  Total intake: 871 kcal (48% of minimum estimated needs)  55 grams protein (66% of minimum estimated needs)  Average Total intake: 1200 kcal (67% of minimum estimated needs)  68 grams protein (81% of minimum estimated needs)  Labs reviewed: Na: 134, CBGS: 84-87 (inpatient orders for glycemic control are 0-9 units insulin aspart every 6 hours).   NUTRITION - FOCUSED PHYSICAL EXAM:    Most Recent Value  Orbital Region  Moderate depletion  Upper Arm Region  Moderate depletion  Thoracic and Lumbar Region  Unable to assess  Buccal Region  Mild depletion  Temple Region  Mild depletion  Clavicle Bone Region  Mild depletion  Clavicle and Acromion Bone Region  Mild depletion  Scapular Bone Region  Mild depletion  Dorsal Hand  Moderate depletion  Patellar Region  Unable to assess  Anterior Thigh Region  Mild depletion  Posterior Calf Region  Unable to assess  Edema (RD Assessment)  None  Hair  Reviewed  Eyes  Reviewed  Mouth  Reviewed  Skin  Reviewed  Nails  Reviewed       Diet Order:   Diet Order            DIET DYS 2 Room service appropriate? Yes with Assist; Fluid consistency: Thin  Diet effective now              EDUCATION NEEDS:   Education needs have been addressed  Skin:  Skin Assessment: Skin Integrity Issues: Skin Integrity Issues:: Stage II, Unstageable Stage II: lt leg x 2, coccyx Unstageable: b/l BKA stumps Other: Midline, lower abdominal EC fistula  Last BM:  05/28/19 (350 ml via colostomy)  Height:   Ht Readings from Last 1 Encounters:  05/27/19 5\' 2"  (1.575 m)    Weight:   Wt Readings from Last 1 Encounters:  05/28/19 53 kg   BMI:  Body mass index is 21.37 kg/m.  Estimated Nutritional Needs:   Kcal:  1800-2000 kcals  Protein:  83-110 g  Fluid:  >/= 1.9 L    Loistine Chance, RD, LDN, [place] Registered Dietitian II Certified Diabetes Care  and Education Specialist Please refer to [facility] for RD and/or RD on-call/weekend/after hours pager

## 2019-05-28 NOTE — Progress Notes (Signed)
Went to patient room to placed a PICC line but patient have a temp of 101 F. Spoke to floor RN to hold off placing PICC and to notify MD. Will follow up.

## 2019-05-29 DIAGNOSIS — E440 Moderate protein-calorie malnutrition: Secondary | ICD-10-CM | POA: Insufficient documentation

## 2019-05-29 LAB — COMPREHENSIVE METABOLIC PANEL
ALT: 15 U/L (ref 0–44)
AST: 14 U/L — ABNORMAL LOW (ref 15–41)
Albumin: 1.5 g/dL — ABNORMAL LOW (ref 3.5–5.0)
Alkaline Phosphatase: 128 U/L — ABNORMAL HIGH (ref 38–126)
Anion gap: 7 (ref 5–15)
BUN: 21 mg/dL — ABNORMAL HIGH (ref 6–20)
CO2: 18 mmol/L — ABNORMAL LOW (ref 22–32)
Calcium: 8.1 mg/dL — ABNORMAL LOW (ref 8.9–10.3)
Chloride: 106 mmol/L (ref 98–111)
Creatinine, Ser: 0.97 mg/dL (ref 0.44–1.00)
GFR calc Af Amer: >60 mL/min (ref >60)
GFR calc non Af Amer: >60 mL/min (ref >60)
Glucose, Bld: 93 mg/dL (ref 70–99)
Potassium: 4.3 mmol/L (ref 3.5–5.1)
Sodium: 131 mmol/L — ABNORMAL LOW (ref 135–145)
Total Bilirubin: 0.8 mg/dL (ref 0.3–1.2)
Total Protein: 6.5 g/dL (ref 6.5–8.1)

## 2019-05-29 LAB — GLUCOSE, CAPILLARY
Glucose-Capillary: 116 mg/dL — ABNORMAL HIGH (ref 70–99)
Glucose-Capillary: 69 mg/dL — ABNORMAL LOW (ref 70–99)
Glucose-Capillary: 74 mg/dL (ref 70–99)
Glucose-Capillary: 81 mg/dL (ref 70–99)
Glucose-Capillary: 87 mg/dL (ref 70–99)
Glucose-Capillary: 89 mg/dL (ref 70–99)
Glucose-Capillary: 92 mg/dL (ref 70–99)

## 2019-05-29 LAB — DIFFERENTIAL
Abs Immature Granulocytes: 0.05 K/uL (ref 0.00–0.07)
Basophils Absolute: 0 K/uL (ref 0.0–0.1)
Basophils Relative: 0 %
Eosinophils Absolute: 0.2 K/uL (ref 0.0–0.5)
Eosinophils Relative: 2 %
Immature Granulocytes: 1 %
Lymphocytes Relative: 18 %
Lymphs Abs: 1.5 K/uL (ref 0.7–4.0)
Monocytes Absolute: 0.9 K/uL (ref 0.1–1.0)
Monocytes Relative: 12 %
Neutro Abs: 5.5 K/uL (ref 1.7–7.7)
Neutrophils Relative %: 67 %

## 2019-05-29 LAB — PREALBUMIN: Prealbumin: 7 mg/dL — ABNORMAL LOW (ref 18–38)

## 2019-05-29 LAB — CBC
HCT: 21.6 % — ABNORMAL LOW (ref 36.0–46.0)
Hemoglobin: 6.7 g/dL — CL (ref 12.0–15.0)
MCH: 29.9 pg (ref 26.0–34.0)
MCHC: 31 g/dL (ref 30.0–36.0)
MCV: 96.4 fL (ref 80.0–100.0)
Platelets: 397 10*3/uL (ref 150–400)
RBC: 2.24 MIL/uL — ABNORMAL LOW (ref 3.87–5.11)
RDW: 14.8 % (ref 11.5–15.5)
WBC: 8.2 10*3/uL (ref 4.0–10.5)
nRBC: 0 % (ref 0.0–0.2)

## 2019-05-29 LAB — PHOSPHORUS: Phosphorus: 4.3 mg/dL (ref 2.5–4.6)

## 2019-05-29 LAB — TRIGLYCERIDES: Triglycerides: 181 mg/dL — ABNORMAL HIGH

## 2019-05-29 LAB — MAGNESIUM: Magnesium: 1.7 mg/dL (ref 1.7–2.4)

## 2019-05-29 LAB — PREPARE RBC (CROSSMATCH)

## 2019-05-29 LAB — VITAMIN C: Vitamin C: 0.5 mg/dL (ref 0.4–2.0)

## 2019-05-29 MED ORDER — LOPERAMIDE HCL 2 MG PO CAPS
2.0000 mg | ORAL_CAPSULE | Freq: Three times a day (TID) | ORAL | Status: DC
  Administered 2019-05-29 – 2019-06-10 (×36): 2 mg via ORAL
  Filled 2019-05-29 (×37): qty 1

## 2019-05-29 MED ORDER — OCTREOTIDE ACETATE 100 MCG/ML IJ SOLN: 100.0000 ug | Freq: Two times a day (BID) | INTRAMUSCULAR | Status: DC

## 2019-05-29 MED ORDER — TRAVASOL 10 % IV SOLN
INTRAVENOUS | Status: AC
  Filled 2019-05-29: qty 537.6

## 2019-05-29 MED ORDER — SODIUM CHLORIDE 0.9% IV SOLUTION: Freq: Once | INTRAVENOUS | Status: AC

## 2019-05-29 MED ORDER — SODIUM CHLORIDE 0.9% FLUSH
10.0000 mL | INTRAVENOUS | Status: DC | PRN
  Administered 2019-05-30 – 2019-06-03 (×2): 10 mL
  Administered 2019-06-09: 20 mL

## 2019-05-29 NOTE — TOC Progression Note (Signed)
Transition of Care [facility]) - Progression Note    Patient Details  Name: Gina [name] MRN: [id number] Date of Birth: 09-May-1960  Transition of Care [facility]) CM/SW [place], [place] Work Phone Number: 05/29/2019, 2:13 PM  Clinical Narrative:    MSW Intern spoke with pt's daughter to confirm that she will be able to take pt home when ready. She confirmed and stated that they have family that can cover for some of the supervision, but have used [facility] in the past for personal care services. MSW Intern called Janeece Riggers and started the [facility]- [id number], contacted doctor to complete paperwork. [facility] was contacted for [facility], as daughter did not have preference, waiting to hear back. Orders were put in for DME, and will update daughter when they confirm. Also need to inform daughter that it may take some time for services to kick in. SW will continue to follow for DC planning.   Expected Discharge Plan: Skilled Nursing Facility(Definite plan not decided yet by patient/adult children. It is between returning to SNF or going home with services (possibly PCS/HH)) Barriers to Discharge: Continued Medical Work up  Expected Discharge Plan and Services Expected Discharge Plan: Skilled Nursing Facility(Definite plan not decided yet by patient/adult children. It is between returning to SNF or going home with services (possibly PCS/HH))                                               Social Determinants of Health (SDOH) Interventions    Readmission Risk Interventions Readmission Risk Prevention Plan 02/10/2019 11/07/2017  Post Dischage Appt - Patient refused  Medication Screening - Complete  Transportation Screening Complete Complete  PCP follow-up - Patient refused  PCP or Specialist Appt within 3-5 Days Not Complete -  Not Complete comments DC date unknown- pt established with PCP -  [place] or [place] Complete -  Social Work Consult for [place] Planning/Counseling  Not Complete -  SW consult not completed comments awaiting call back from daughter and APS -  Palliative Care Screening Not Complete -  Palliative Care Screening Not Complete Comments pending need -  Medication Review ([place]) Referral to Pharmacy -  Some recent data might be hidden

## 2019-05-29 NOTE — Progress Notes (Signed)
Okay per Dr Linus Salmons ID to place PICC.

## 2019-05-29 NOTE — Progress Notes (Signed)
Peripherally Inserted Central Catheter Placement  The IV Nurse has discussed with the patient and/or persons authorized to consent for the patient, the purpose of this procedure and the potential benefits and risks involved with this procedure.  The benefits include less needle sticks, lab draws from the catheter, and the patient may be discharged home with the catheter. Risks include, but not limited to, infection, bleeding, blood clot (thrombus formation), and puncture of an artery; nerve damage and irregular heartbeat and possibility to perform a PICC exchange if needed/ordered by physician.  Alternatives to this procedure were also discussed.  Bard Power PICC patient education guide, fact sheet on infection prevention and patient information card has been provided to patient /or left at bedside.    PICC Placement Documentation  PICC Double Lumen 05/29/19 PICC Right Brachial 36 cm 1 cm (Active)  Indication for Insertion or Continuance of Line Administration of hyperosmolar/irritating solutions (i.e. TPN, Vancomycin, etc.) 05/29/19 1400  Exposed Catheter (cm) 1 cm 05/29/19 1400  Site Assessment Clean;Dry;Intact 05/29/19 1400  Lumen #1 Status Flushed;Blood return noted;Saline locked 05/29/19 1400  Lumen #2 Status Flushed;Blood return noted;Saline locked 05/29/19 1400  Dressing Type Transparent 05/29/19 1400  Dressing Status Clean;Dry;Intact;Antimicrobial disc in place 05/29/19 1400  Dressing Change Due 06/05/19 05/29/19 1400       Gina [name] 05/29/2019, 2:01 PM

## 2019-05-29 NOTE — Progress Notes (Signed)
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  Ongoing  GOAL:   Patient will meet greater than or equal to 90% of their needs  Progressing   MONITOR:   PO intake, Supplement acceptance, Labs, Weight trends, Skin, I & O's  REASON FOR ASSESSMENT:   Consult New TPN/TNA  ASSESSMENT:   59 yo female presents with malaise from [facility] and admitted with MRSA bacteremia. Pt admitted 01/2019 with septic shock and found to have acute peritonitis and underwent ex lap with partial SB resection and intra-abdominal abscess. Pt with SB EC fistula on TPNPMH includes DM, HTN, bilateral BKA, schizophrenia.  3/11- NGT removed 3/12- per general surgery notes, UGI reveals 2 separate EC fistulae 3/13- s/p MBSS- advanced to regular diet with thin liquids; JP drain d/c  3/16- s/p TEE- no evidence of endocarditis  Reviewed I/O's: +28 ml x 24 hours and -764 ml since admission  UOP: 550 ml x 24 hours  Drain output: 20 ml x 24 hours  Colostomy output: 855 ml x 24 hours  Pt resting quietly at time of visit. RD did not disturb.   Pt remains with minimal oral intake; meal completion 25-75%. She is refusing Ensure and Prostat supplements.   Plan to replace PICC today and start TPN. Per pharmacy notes, plan to start TPN at 1800 at 40 ml/hr; regimen to provide 992 kcals and 54 grams protein, meeting 55% of estimated kcal needs 61% of estimated protein needs.  Labs reviewed: Na: 131, Mg, K, and Phos WDL, CBGS: 87-89 (inpatient orders for glycemic  control are 0-9 units insulin aspart every 6 hours).   Diet Order:   Diet Order            DIET DYS 2 Room service appropriate? Yes with Assist; Fluid consistency: Thin  Diet effective now              EDUCATION NEEDS:   Education needs have been addressed  Skin:  Skin Assessment: Skin Integrity Issues: Skin Integrity Issues:: Stage II, Unstageable Stage II: lt leg x 2, coccyx Unstageable: b/l BKA stumps Other: Midline, lower abdominal EC fistula  Last BM:  05/29/19 (80 ml via colostomy)  Height:   Ht Readings from Last 1 Encounters:  05/27/19 5\' 2"  (1.575 m)    Weight:   Wt Readings from Last 1 Encounters:  05/28/19 53 kg   BMI:  Body mass index is 21.37 kg/m.  Estimated Nutritional Needs:   Kcal:  1800-2000 kcals  Protein:  83-110 g  Fluid:  >/= 1.9 L    Loistine Chance, RD, LDN, [place] Registered Dietitian II Certified Diabetes Care and Education Specialist Please refer to [facility] for RD and/or RD on-call/weekend/after hours pager

## 2019-05-29 NOTE — Progress Notes (Signed)
PHARMACY - TOTAL PARENTERAL NUTRITION CONSULT NOTE   Indication: Fistula  Patient Measurements: Height: _0  (157.5 cm) Weight: 116 lb 13.5 oz (53 kg) IBW/kg (Calculated) : 50.1 TPN AdjBW (KG): 54.6 Body mass index is 21.37 kg/m.  Assessment:  42 YOF with hx of partial small bowel resection/EC fistula on chronic TPN since beginning of year.  Significant history of DM with bilateral BKA.  Presenting from SNF with MRSA bacteremia, suspected source from PICC.  Has been on oral diet this admission with line holiday however decreased appetite and may not be absorbing much per RD, unable to meet requirements.  Moderate malnutrition of chronic disease per RD assessment.     Glucose / Insulin: History of DM. Current CBGs 70-90s, no insulin in last 24 hours. Sensitive SSI.  Electrolytes: Na low at 134, CO2 down 18. Others within normal limits.  Renal: SCr 0.84 - stable.  LFTs / TGs: Alk Phos elevated - trend down. Others within normal limits.  Prealbumin / albumin: Prealbumin 7.1/ Albumin 1.5 Intake / Output; MIVF: UOP 0.9 mL/kg/hr. Colostomy output 945 mL. JP drain 20 mL.  GI Imaging: none since start of TPN Surgeries / Procedures: none since start of TPN  Central access: PICC to be replaced today TPN start date: 05/29/19  Nutritional Goals (per RD recommendation on 3/17): kCal: 1800-2000, Protein: 83-110g, Fluid: >= 1.9L Goal TPN rate is 75 mL/hr (provides 100.8 g of protein and 1860 kcals per day)  Current Nutrition:  Soft diet - Dysphagia 2 --intake 10-70% per meal D10% at 30 mL/hr Ensure Enlive TID (each supplement  Plan:  Start TPN at 40 mL/hr at 1800 Electrolytes in TPN: 617mq/L of Na, 556m/L of K, 17m317mL of Ca, 17mE66m of Mg, and 117mm43m of Phos. Cl:Ac ratio 1:1 Add standard MVI and trace elements to TPN Continue  Sensitive q6h SSI and adjust as needed  Discontinue D 10% when TPN bag hangs at 1800 PM.  Monitor TPN labs on Mon/Thurs, and daily with start of TPN.   Monitor  po intake and plan for TPN long-term.   JessiSloan LeiterrmD, BCPS, BCCCP Clinical Pharmacist Please refer to [facility] 05/29/2019,7:11 AM

## 2019-05-29 NOTE — Progress Notes (Signed)
Hospital Bed:   Question Answer Comment  Length of Need 6 Months   The above medical condition requires: Patient requires the ability to reposition frequently   Head must be elevated greater than: 30 degrees   Bed type Semi-electric   Hoyer Lift Yes   Support Surface: Gel Overlay     Wheelchair:   Patient suffers from bilateral BKA which impairs their ability to perform daily activities like ambulating/ bathing in the home. A walker will not resolve  issue with performing activities of daily living. A wheelchair will allow patient to safely perform daily activities. Patient is not able to propel themselves in the home using a standard weight wheelchair due to weakness. Patient can self propel in the lightweight wheelchair. Length of need lifetime.  Accessories: elevating leg rests (ELRs), wheel locks, extensions and anti-tippers.

## 2019-05-29 NOTE — Progress Notes (Signed)
PT Cancellation Note  Patient Details Name: Gina [name] MRN: [id number] DOB: October 21, 1960   Cancelled Treatment:    Reason Eval/Treat Not Completed: Patient not medically ready PT received reorders on pt.  Noted that she was evaluated last week and d/c due to near baseline and going back to SNF.  However, pt has now decided to go home with family.  PT unable to see pt today due to Hgb 6.7 and pt now getting PRBC.  Will f/u as able. Maggie Font, PT Acute Rehab Services Pager [phone] [facility] [phone] [facility] [phone]   Karlton Lemon 05/29/2019, 1:12 PM

## 2019-05-29 NOTE — Progress Notes (Signed)
PROGRESS NOTE    Gina [name]  DZH:[id number] DOB: 07/10/1960 DOA: 05/22/2019 PCP: [name], MD   Brief Narrative: 59 year old female with DM, HTN, schizophrenia, bilateral BKA sent from [facility] due to positive blood culture.  Recent admission November 2020 for perforated prepyloric ulcer with septic shock with acute peritonitis status post exlap with [name] patch on 11/23, and was discharged to select LT[facility] on Unasyn Bactrim and antifungal for total 6 weeks.  Patient was discharged with Foley catheter, PICC line, bilateral lower quadrant drains along with enterocutaneous fistula with colostomy bag . [facility] has been [facility] for last 3 months. Patient  was sent to ER for vague sleepiness malaise anorexia low-grade fever and leukocytosis at SNF, and in the ER exam labs unremarkable and patient was sent back to SNF but overnight blood culture returned MRSA 2/2 and she was readmitted to the hospital.  Patient is being followed by infectious disease, general surgery.She did well with speech, diet was restarted, PICC line was removed as no longer needing TPN and also needing line holidays given her MRSA bacteremia.  Foley catheter was exchanged.  Repeat blood culture negative from 3/2.  ID has advised to continue vancomycin through 3/25 and ordered PICC line 3/17. Surgery has been following patient and on calorie count, her intestinal obstruction is not reliable so if it is not ideal and if not able to meet calorie count will likely need to go back on TPN.  Patient and family planning to return home with home health services upon discharge and has declined skilled nursing facility placement.    Subjective: Reports she is feeling somewhat better today.  No nausea or vomiting.  Complains of bed positioning  Assessment & Plan:  MRSA bacteremia: indwelling PICC line used for TPN is felt likely source and PICC is out. Appreciate ID on board. For TEE  3/16. Repeat blood cultures 3/12- Negative so far.TTE negative for endocarditis.  Noted ID plan for PICC line placement-held yesterday due to fever hopefully we can put PICC line today.  Patient to continue vancomycin through 3/25.   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  Increasing Imodium to decrease for stool output-appreciate surgery input  Moderate protein-calorie malnutrition: was TPN dependent, dietitian on consult.  Now on oral diet and calorie count-surgery planning to address with TPN if unable to meet her requirement, as her intestinal obstruction is questionable and not ideal candidate for tube feeding. Await final surgery plan for dispo-surgery has recommended TPN moving forward and patient has agreed.  Hopefully PICC line replaced today  Bilateral stump wounds continue wound care and dressing.  Recent admission November 2020 for perforated prepyloric ulcer with septic shock with acute peritonitis status post exlap with [name] patch on 11/23-patient undergoing bilateral lower quadrant drain, enterocutaneous fistula with colostomy bag.  Upper GI shows fistulous connection at 2 points in the small bowel (proximal small bowel, suspect 1st site of communication is proximal jejunum) connecting to the known collection in the anterior abdomen and io the skin.  May need drain injection to further characterize fistula in the future as well as EGD per surgery and will need to follow-up as outpatient  HTN:BP well controlled, cont amlodipine.  Metabolic acidosis/elevated BUN/dehydration: Dehydration likely in the setting of EC fistula output.  Tolerating  diet-gentle IV fluid hydration. bUN has normalized from 46-->14. She is off ivf.   Diabetes mellitus: hbA1c  5.9 3/12.  With hypoglycemia blood sugar 67 3/12, off lantus and on ssi. sugar stable. Recent Labs  Lab 05/28/19 1951 05/29/19 0006 05/29/19 0351 05/29/19 0737 05/29/19 1141  GLUCAP 88 92 74 89 87   Anemia of chronic disease: Previously hemoglobin has been ranging from 9 to 8 g, and drifting downward.  6.7 g today patient appears weak and symptomatic.  Discussed next benefits and agrees for transfusion will transfuse 2 unit PRBC monitor H&H closely. Anemia panel ferritin 646, iron low at 22, B12 750, folate 22. Hemoccult was negative. No Signs of active bleeding. Recent Labs  Lab 05/25/19 0632 05/26/19 0354 05/27/19 0429 05/28/19 0152 05/29/19 1003  HGB 7.1* 7.5* 7.5* 7.1* 6.7*  HCT 22.4* 24.1* 23.8* 22.8* 21.6*   Below-knee amputee/Atherosclerosis of native arteries: Stump evaluation by wound care for her ilateral stump wounds   Transaminitis: Suspect from TPN.  Monitor closely.  Asymptomatic bacteriuria: replaceD Foley after admission.  GERD: on PPI  Mood disorder:on Celexa  Goals of care: Palliative care consulted patient is DNR MOST form filled.Per recommendation to treat what is treatable and plan on palliative care referral upon discharge.Given significant comorbidities malnutrition patient is at high risk for readmission and decompensation.  Interventions: Refer to RD note for recommendations Body mass index is 21.37 kg/m.  Pressure Ulcer: Pressure Injury 05/22/19 Coccyx Bilateral Stage 2 -  Partial thickness loss of dermis presenting as a shallow open injury with a red, pink wound bed without slough. (Active)  05/22/19 2104  Location: Coccyx  Location Orientation: Bilateral  Staging: Stage 2 -  Partial thickness loss of dermis presenting as a shallow open injury with a red, pink wound bed without slough.  Wound Description (Comments):   Present on Admission: Yes     Pressure Injury 05/22/19 Knee Right;Distal Unstageable - Full thickness tissue loss in which the  base of the injury is covered by slough (yellow, tan, gray, green or brown) and/or eschar (tan, brown or black) in the wound bed. Stump (Active)  05/22/19 2106  Location: Knee  Location Orientation: Right;Distal  Staging: Unstageable - Full thickness tissue loss in which the base of the injury is covered by slough (yellow, tan, gray, green or brown) and/or eschar (tan, brown or black) in the wound bed.  Wound Description (Comments): Stump  Present on Admission: Yes     Pressure Injury 05/22/19 Knee Left;Distal Unstageable - Full thickness tissue loss in which the base of the injury is covered by slough (yellow, tan, gray, green or brown) and/or eschar (tan, brown or black) in the wound bed. Stump (Active)  05/22/19 2108  Location: Knee  Location Orientation: Left;Distal  Staging: Unstageable - Full thickness tissue loss in which the base of the injury is covered by slough (yellow, tan, gray, green or brown) and/or eschar (tan, brown or black) in the wound bed.  Wound Description (Comments): Stump  Present on Admission: Yes     Pressure Injury 05/22/19 Thigh Left;Mid Stage 2 -  Partial thickness loss of dermis presenting as a shallow open injury with a red, pink wound bed without slough. (Active)  05/22/19 2109  Location: Thigh  Location Orientation: Left;Mid  Staging: Stage 2 -  Partial thickness loss of dermis presenting as a shallow open injury with a red, pink wound bed without slough.  Wound Description (Comments):   Present on Admission: Yes   DVT prophylaxis:lovenox   Code Status: DNR Family Communication: plan of care discussed with patient at bedside. Called her daughter [name] no answer-will reattempt. Disposition Plan: Patient is from: SNF Anticipated Disposition: to home with home and services.  Patient reports she is not going back to skilled nursing facility.   Barriers to discharge or conditions that needs to be met prior to discharge: Patient remains hospitalized for MRSA  bacteremia treatment, and protein calorie malnutrition to see if patient needs to be placed back on TPN for discharge home for PICC line today and also getting blood transfusion for severe symptomatic anemia.  Consultants: Infectious disease, general surgery Procedures:  2D ECHO 1. Left ventricular ejection fraction, by estimation, is 55 to 60%. The  left ventricle has normal function. The left ventricle has no regional  wall motion abnormalities. Left ventricular diastolic parameters were  normal.  2. Right ventricular systolic function is normal. The right ventricular  size is normal.  3. The mitral valve is normal in structure. No evidence of mitral valve  regurgitation. No evidence of mitral stenosis.  4. The aortic valve is normal in structure. Aortic valve regurgitation is  not visualized. No aortic stenosis is present.   Microbiology: Blood culture with MRSA 05/21/19  Medications: Scheduled Meds: . sodium chloride   Intravenous Once  . amLODipine  5 mg Oral Daily  . chlorhexidine  15 mL Mouth Rinse BID  . Chlorhexidine Gluconate Cloth  6 each Topical Daily  . enoxaparin (LOVENOX) injection  40 mg Subcutaneous Q24H  . escitalopram  10 mg Oral Daily  . feeding supplement (ENSURE ENLIVE)  237 mL Oral TID BM  . feeding supplement (PRO-STAT SUGAR FREE 64)  30 mL Oral BID WC  . gabapentin  300 mg Oral BID  . insulin aspart  0-9 Units Subcutaneous Q6H  . loperamide  2 mg Oral Q8H  . mouth rinse  15 mL Mouth Rinse q12n4p  . Melatonin  3 mg Oral QHS  . metoprolol tartrate  50 mg Oral BID  . multivitamin with minerals  1 tablet Oral Daily  . nystatin  5 mL Oral QID  . pantoprazole  40 mg Oral Daily  . simethicone  80 mg Oral QID   Continuous Infusions: . dextrose 30 mL/hr at 05/28/19 1914  . TPN ADULT (ION)    . vancomycin 1,000 mg (05/28/19 2152)    Antimicrobials: Anti-infectives (From admission, onward)   Start     Dose/Rate Route Frequency Ordered Stop   05/25/19  2200  vancomycin (VANCOCIN) IVPB 1000 mg/200 mL premix     1,000 mg 200 mL/hr over 60 Minutes Intravenous Every 24 hours 05/25/19 0958     05/24/19 2200  vancomycin (VANCOREADY) IVPB 750 mg/150 mL  Status:  Discontinued     750 mg 150 mL/hr over 60 Minutes Intravenous Every 24 hours 05/24/19 1156 05/25/19 0958   05/23/19 2200  vancomycin (VANCOCIN) IVPB 1000 mg/200 mL premix     1,000 mg 200 mL/hr over 60 Minutes Intravenous  Once 05/23/19 1345 05/24/19 0008   05/23/19 1113  vancomycin variable dose per unstable renal function (pharmacist dosing)  Status:  Discontinued      Does not apply See admin instructions 05/23/19 1114 05/24/19 1156   05/22/19 1500  vancomycin (VANCOREADY) IVPB 1250 mg/250 mL  Status:  Discontinued     1,250 mg 166.7 mL/hr over 90 Minutes Intravenous Every 24 hours 05/22/19 1429 05/23/19 1114     Objective: Vitals: Today's Vitals   05/28/19 2254 05/29/19 0347   05/29/19 0347 05/29/19 0907  BP:  (!) 142/65 (!) 142/65 (!) 102/53  Pulse:  71 71 67  Resp:  20 20 20  Temp:  98.3 F (36.8 C) 98.3 F (36.8 C) 98.3 F (36.8 C)  TempSrc:  Oral Oral Oral  SpO2:  97% 97% 95%  Weight:      Height:      PainSc: Asleep       Intake/Output Summary (Last 24 hours) at 05/29/2019 1218 Last data filed at 05/29/2019 0900 Gross per 24 hour  Intake 1572.47 ml  Output 925 ml  Net 647.47 ml   Filed Weights   05/25/19 2034 05/27/19 1140 05/28/19 0438  Weight: 54.6 kg 54.6 kg 53 kg   Weight change:    Intake/Output from previous day: 03/17 0701 - 03/18 0700 In: 1452.5 [P.O.:960; I.V.:292.5; IV Piggyback:200] Out: 1425 [Urine:550; Drains:20; Stool:855] Intake/Output this shift: Total I/O In: 240 [P.O.:240] Out: 0   Examination:  General exam: Alert awake ill frail cachectic, thin.   HEENT:Oral mucosa moist, Ear/Nose WNL grossly,dentition normal. Respiratory system: b/l clearno use of accessory muscle, non tender. Cardiovascular system: S1 & S2 +, regular, No  JVD. Gastrointestinal system: Abdomen soft, drain present on the left side EC fistula with liquid output, Foley catheter in place.  Dressing on the right lower quadrant.  Nervous System:Alert, awake, moving extremities and grossly nonfocal Extremities: No edema, distal peripheral pulses palpable.  Skin: No rashes,no icterus. MSK: BKA bilaterally with a stump with wounds.    Data Reviewed: I have personally reviewed following labs and imaging studies CBC: Recent Labs  Lab 05/25/19 0632 05/26/19 0354 05/27/19 0429 05/28/19 0152 05/29/19 1003  WBC 8.4 8.1 7.7 11.5* 8.2  NEUTROABS  --   --   --   --  5.5  HGB 7.1* 7.5* 7.5* 7.1* 6.7*  HCT 22.4* 24.1* 23.8* 22.8* 21.6*  MCV 94.1 96.4 96.7 98.3 96.4  PLT 260 300 344 390 397   Basic Metabolic Panel: Recent Labs  Lab 05/25/19 0632 05/26/19 0354 05/27/19 0429 05/28/19 0152 05/29/19 1003  NA 134* 136 136 134* 131*  K 3.8 3.7 3.8 4.1 4.3  CL 105 108 109 109 106  CO2 21* 17* 19* 18* 18*  GLUCOSE 124* 139* 82 91 93  BUN 24* 18 14 18 21*  CREATININE 0.84 0.83 0.71 0.84 0.97  CALCIUM 8.1* 8.3* 8.2* 8.3* 8.1*  MG  --   --   --   --  1.7  PHOS  --   --   --   --  4.3   GFR: Estimated Creatinine Clearance: 50 mL/min (by C-G formula based on SCr of 0.97 mg/dL). Liver Function Tests: Recent Labs  Lab 05/25/19 0632 05/26/19 0354 05/27/19 0429 05/28/19 0152 05/29/19 1003  AST 21 19 18 15 14*  ALT 26 23 21 17 15  ALKPHOS 154* 152* 135* 137* 128*  BILITOT 1.0 0.8 0.5 0.8 0.8  PROT 6.5 6.9 6.4* 6.7 6.5  ALBUMIN 1.5* 1.6* 1.5* 1.5* 1.5*   No results for input(s): LIPASE, AMYLASE in the last 168 hours. No results for input(s): AMMONIA in the last 168 hours. Coagulation Profile: No results for input(s): INR, PROTIME in the last 168 hours. Cardiac Enzymes: No results for input(s): CKTOTAL, CKMB, CKMBINDEX, TROPONINI in the last 168 hours. BNP (last 3 results) No results for input(s): PROBNP in the last 8760 hours. HbA1C: No  results for input(s): HGBA1C in the last 72 hours. CBG: Recent Labs  Lab 05/28/19 1951   05/29/19 0006 05/29/19 0351 05/29/19 0737 05/29/19 1141  GLUCAP 88 92 74 89 87   Lipid Profile: Recent Labs    05/29/19 1003  TRIG 181*   Thyroid Function Tests: No results for input(s): TSH, T4TOTAL, FREET4, T3FREE, THYROIDAB in the last 72 hours. Anemia Panel: No results for input(s): VITAMINB12, FOLATE, FERRITIN, TIBC, IRON, RETICCTPCT in the last 72 hours. Sepsis Labs: Recent Labs  Lab 05/22/19 1633  LATICACIDVEN 1.2    Recent Results (from the past 240 hour(s))  Urine Culture     Status: Abnormal   Collection Time: 05/21/19 10:00 AM   Specimen: Urine, Catheterized  Result Value Ref Range Status   Specimen Description URINE, CATHETERIZED  Final   Special Requests   Final    NONE Performed at [facility], [address] [place], [place] [address]    Culture (A)  Final    >=100,000 COLONIES/mL PSEUDOMONAS AERUGINOSA 50,000 COLONIES/mL VANCOMYCIN RESISTANT ENTEROCOCCUS ISOLATED    Report Status 05/24/2019 FINAL  Final   Organism ID, Bacteria PSEUDOMONAS AERUGINOSA (A)  Final   Organism ID, Bacteria VANCOMYCIN RESISTANT ENTEROCOCCUS ISOLATED (A)  Final      Susceptibility   Pseudomonas aeruginosa - MIC*    CEFTAZIDIME 4 SENSITIVE Sensitive     CIPROFLOXACIN <=0.25 SENSITIVE Sensitive     GENTAMICIN <=1 SENSITIVE Sensitive     IMIPENEM 1 SENSITIVE Sensitive     PIP/TAZO <=4 SENSITIVE Sensitive     CEFEPIME 2 SENSITIVE Sensitive     * >=100,000 COLONIES/mL PSEUDOMONAS AERUGINOSA   Vancomycin resistant enterococcus isolated - MIC*    AMPICILLIN >=32 RESISTANT Resistant     NITROFURANTOIN 256 RESISTANT Resistant     VANCOMYCIN >=32 RESISTANT Resistant     LINEZOLID 2 SENSITIVE Sensitive     * 50,000 COLONIES/mL VANCOMYCIN RESISTANT ENTEROCOCCUS ISOLATED  Respiratory Panel by RT PCR (Flu A&B, Covid) - Nasopharyngeal Swab     Status: None   Collection Time: 05/21/19  10:03 AM   Specimen: Nasopharyngeal Swab  Result Value Ref Range Status   SARS Coronavirus 2 by RT PCR NEGATIVE NEGATIVE Final    Comment: (NOTE) SARS-CoV-2 target nucleic acids are NOT DETECTED. The SARS-CoV-2 RNA is generally detectable in upper respiratoy specimens during the acute phase of infection. The lowest concentration of SARS-CoV-2 viral copies this assay can detect is 131 copies/mL. A negative result does not preclude SARS-Cov-2 infection and should not be used as the sole basis for treatment or other patient management decisions. A negative result may occur with  improper specimen collection/handling, submission of specimen other than nasopharyngeal swab, presence of viral mutation(s) within the areas targeted by this assay, and inadequate number of viral copies (<131 copies/mL). A negative result must be combined with clinical observations, patient history, and epidemiological information. The expected result is Negative. Fact Sheet for Patients:  https://www.fda.gov/media/142436/download Fact Sheet for Healthcare Providers:  https://www.fda.gov/media/142435/download This test is not yet ap proved or cleared by the [place] FDA and  has been authorized for detection and/or diagnosis of SARS-CoV-2 by FDA under an Emergency Use Authorization (EUA). This EUA will remain  in effect (meaning this test can be used) for the duration of the COVID-19 declaration under Section 564(b)(1) of the Act, 21 U.S.C. section 360bbb-3(b)(1), unless the authorization is terminated or revoked sooner.    Influenza A by PCR NEGATIVE NEGATIVE Final   Influenza B by PCR NEGATIVE NEGATIVE Final    Comment: (NOTE) The Xpert Xpress SARS-CoV-2/FLU/RSV assay is intended as an   aid in  the diagnosis of influenza from Nasopharyngeal swab specimens and  should not be used as a sole basis for treatment. Nasal washings and  aspirates are unacceptable for Xpert Xpress SARS-CoV-2/FLU/RSV   testing. Fact Sheet for Patients: https://www.fda.gov/media/142436/download Fact Sheet for Healthcare Providers: https://www.fda.gov/media/142435/download This test is not yet approved or cleared by the [place] FDA and  has been authorized for detection and/or diagnosis of SARS-CoV-2 by  FDA under an Emergency Use Authorization (EUA). This EUA will remain  in effect (meaning this test can be used) for the duration of the  Covid-19 declaration under Section 564(b)(1) of the Act, 21  U.S.C. section 360bbb-3(b)(1), unless the authorization is  terminated or revoked. Performed at [facility], [address] [place], [place] [address]   Blood culture (routine x 2)     Status: Abnormal   Collection Time: 05/21/19 10:38 AM   Specimen: BLOOD RIGHT ARM  Result Value Ref Range Status   Specimen Description BLOOD RIGHT ARM  Final   Special Requests   Final    AEROBIC BOTTLE ONLY Blood Culture results may not be optimal due to an inadequate volume of blood received in culture bottles   Culture  Setup Time   Final    GRAM POSITIVE COCCI IN CLUSTERS AEROBIC BOTTLE ONLY CRITICAL RESULT CALLED TO, READ BACK BY AND VERIFIED WITH: S. COBLE RN, AT 0716 05/22/19 BY D. VANHOOK Performed at [facility], [address] [place], [place] [address]    Culture METHICILLIN RESISTANT STAPHYLOCOCCUS AUREUS (A)  Final   Report Status 05/24/2019 FINAL  Final   Organism ID, Bacteria METHICILLIN RESISTANT STAPHYLOCOCCUS AUREUS  Final      Susceptibility   Methicillin resistant staphylococcus aureus - MIC*    CIPROFLOXACIN >=8 RESISTANT Resistant     ERYTHROMYCIN <=0.25 SENSITIVE Sensitive     GENTAMICIN <=0.5 SENSITIVE Sensitive     OXACILLIN >=4 RESISTANT Resistant     TETRACYCLINE <=1 SENSITIVE Sensitive     VANCOMYCIN 1 SENSITIVE Sensitive     TRIMETH/SULFA <=10 SENSITIVE Sensitive     CLINDAMYCIN <=0.25 SENSITIVE Sensitive     RIFAMPIN <=0.5 SENSITIVE Sensitive     Inducible  Clindamycin NEGATIVE Sensitive     * METHICILLIN RESISTANT STAPHYLOCOCCUS AUREUS  Blood culture (routine x 2)     Status: Abnormal   Collection Time: 05/21/19 10:38 AM   Specimen: BLOOD RIGHT HAND  Result Value Ref Range Status   Specimen Description BLOOD RIGHT HAND  Final   Special Requests   Final    AEROBIC BOTTLE ONLY Blood Culture results may not be optimal due to an inadequate volume of blood received in culture bottles   Culture  Setup Time   Final    AEROBIC BOTTLE ONLY GRAM POSITIVE COCCI IN CLUSTERS CRITICAL RESULT CALLED TO, READ BACK BY AND VERIFIED WITH: K NEAL RN 05/22/19 0137 JDW    Culture (A)  Final    STAPHYLOCOCCUS AUREUS SUSCEPTIBILITIES PERFORMED ON PREVIOUS CULTURE WITHIN THE LAST 5 DAYS. Performed at [facility], [address] [place], [place] [address]    Report Status 05/24/2019 FINAL  Final  Blood Culture ID Panel (Reflexed)     Status: Abnormal   Collection Time: 05/21/19 10:38 AM  Result Value Ref Range Status   Enterococcus species NOT DETECTED NOT DETECTED Final   Listeria monocytogenes NOT DETECTED NOT DETECTED Final   Staphylococcus species DETECTED (A) NOT DETECTED Final    Comment: CRITICAL RESULT   CALLED TO, READ BACK BY AND VERIFIED WITH: S. COBLE RN, AT 0716 05/22/19 BY D. VANHOOK    Staphylococcus aureus (BCID) DETECTED (A) NOT DETECTED Final    Comment: Methicillin (oxacillin)-resistant Staphylococcus aureus (MRSA). MRSA is predictably resistant to beta-lactam antibiotics (except ceftaroline). Preferred therapy is vancomycin unless clinically contraindicated. Patient requires contact precautions if  hospitalized. CRITICAL RESULT CALLED TO, READ BACK BY AND VERIFIED WITH: S. COBLE RN, AT 0716 05/22/19 BY D. VANHOOK    Methicillin resistance DETECTED (A) NOT DETECTED Final    Comment: CRITICAL RESULT CALLED TO, READ BACK BY AND VERIFIED WITH: S. COBLE RN, AT 0716 05/22/19 BY D. VANHOOK    Streptococcus species NOT DETECTED NOT DETECTED  Final   Streptococcus agalactiae NOT DETECTED NOT DETECTED Final   Streptococcus pneumoniae NOT DETECTED NOT DETECTED Final   Streptococcus pyogenes NOT DETECTED NOT DETECTED Final   Acinetobacter baumannii NOT DETECTED NOT DETECTED Final   Enterobacteriaceae species NOT DETECTED NOT DETECTED Final   Enterobacter cloacae complex NOT DETECTED NOT DETECTED Final   Escherichia coli NOT DETECTED NOT DETECTED Final   Klebsiella oxytoca NOT DETECTED NOT DETECTED Final   Klebsiella pneumoniae NOT DETECTED NOT DETECTED Final   Proteus species NOT DETECTED NOT DETECTED Final   Serratia marcescens NOT DETECTED NOT DETECTED Final   Haemophilus influenzae NOT DETECTED NOT DETECTED Final   Neisseria meningitidis NOT DETECTED NOT DETECTED Final   Pseudomonas aeruginosa NOT DETECTED NOT DETECTED Final   Candida albicans NOT DETECTED NOT DETECTED Final   Candida glabrata NOT DETECTED NOT DETECTED Final   Candida krusei NOT DETECTED NOT DETECTED Final   Candida parapsilosis NOT DETECTED NOT DETECTED Final   Candida tropicalis NOT DETECTED NOT DETECTED Final    Comment: Performed at [facility], [address] [place], [place] [address]  SARS CORONAVIRUS 2 (TAT 6-24 HRS) Nasopharyngeal Nasopharyngeal Swab     Status: None   Collection Time: 05/22/19  2:45 PM   Specimen: Nasopharyngeal Swab  Result Value Ref Range Status   SARS Coronavirus 2 NEGATIVE NEGATIVE Final    Comment: (NOTE) SARS-CoV-2 target nucleic acids are NOT DETECTED. The SARS-CoV-2 RNA is generally detectable in upper and lower respiratory specimens during the acute phase of infection. Negative results do not preclude SARS-CoV-2 infection, do not rule out co-infections with other pathogens, and should not be used as the sole basis for treatment or other patient management decisions. Negative results must be combined with clinical observations, patient history, and epidemiological information. The expected result is  Negative. Fact Sheet for Patients: https://www.fda.gov/media/138098/download Fact Sheet for Healthcare Providers: https://www.fda.gov/media/138095/download This test is not yet approved or cleared by the [place] FDA and  has been authorized for detection and/or diagnosis of SARS-CoV-2 by FDA under an Emergency Use Authorization (EUA). This EUA will remain  in effect (meaning this test can be used) for the duration of the COVID-19 declaration under Section 56 4(b)(1) of the Act, 21 U.S.C. section 360bbb-3(b)(1), unless the authorization is terminated or revoked sooner. Performed at [facility], [address] , [place] [address]   Culture, blood (routine x 2)     Status: None   Collection Time: 05/23/19  5:34 AM   Specimen: BLOOD  Result Value Ref Range Status   Specimen Description BLOOD LEFT ARM  Final   Special Requests   Final    BOTTLES DRAWN AEROBIC AND ANAEROBIC Blood Culture results may not be optimal due to an excessive volume of blood received   in culture bottles   Culture   Final    NO GROWTH 5 DAYS Performed at [facility], [address] [place], [place] [address]    Report Status 05/28/2019 FINAL  Final  Culture, blood (routine x 2)     Status: None   Collection Time: 05/23/19  5:42 AM   Specimen: BLOOD  Result Value Ref Range Status   Specimen Description BLOOD LEFT HAND  Final   Special Requests   Final    BOTTLES DRAWN AEROBIC AND ANAEROBIC Blood Culture adequate volume   Culture   Final    NO GROWTH 5 DAYS Performed at [facility], [address] [place], [place] [address]    Report Status 05/28/2019 FINAL  Final      Radiology Studies: ECHO TEE  Result Date: 05/27/2019    TRANSESOPHOGEAL ECHO REPORT   Patient Name:   Gina [name] Date of Exam: 05/27/2019 Medical Rec #:  [id number]       Height:       62.0 in Accession #:    [id number]      Weight:       120.4 lb Date of Birth:  09/03/1960        BSA:          1.541 m  Patient Age:    59 years        BP:           90/41 mmHg Patient Gender: F               HR:           62 bpm. Exam Location:  Inpatient Procedure: Transesophageal Echo, Cardiac Doppler and Color Doppler Indications:     Bacteremia  History:         Patient has prior history of Echocardiogram examinations, most                  recent 05/23/2019. Signs/Symptoms:Fever; Risk Factors:Diabetes                  and Hypertension. MRSA                  Septic shock.  Sonographer:     Chelsea Turrentine Referring Phys:  [id number] KRISTA M. KROEGER Diagnosing Phys: Tiffany Oakhurst MD PROCEDURE: The transesophogeal probe was passed without difficulty through the esophogus of the patient. Sedation performed by different physician. The patient's vital signs; including heart rate, blood pressure, and oxygen saturation; remained stable throughout the procedure. The patient developed no complications during the procedure. IMPRESSIONS  1. Left ventricular ejection fraction, by estimation, is 55 to 60%. The left ventricle has normal function. The left ventricle has no regional wall motion abnormalities. Left ventricular diastolic function could not be evaluated.  2. Right ventricular systolic function is normal. The right ventricular size is normal.  3. No left atrial/left atrial appendage thrombus was detected.  4. The mitral valve is normal in structure. Mild mitral valve regurgitation. No evidence of mitral stenosis.  5. The aortic valve is tricuspid. Aortic valve regurgitation is not visualized. No aortic stenosis is present. Conclusion(s)/Recommendation(s): Normal biventricular function without evidence of hemodynamically significant valvular heart disease. FINDINGS  Left Ventricle: Left ventricular ejection fraction, by estimation, is 55 to 60%. The left ventricle has normal function. The left ventricle has no regional wall motion abnormalities. The left ventricular internal cavity size was normal in size. There is  no left  ventricular hypertrophy.   Left ventricular diastolic function could not be evaluated. Right Ventricle: The right ventricular size is normal. No increase in right ventricular wall thickness. Right ventricular systolic function is normal. Left Atrium: Left atrial size was normal in size. No left atrial/left atrial appendage thrombus was detected. Right Atrium: Right atrial size was normal in size. Pericardium: Trivial pericardial effusion is present. Mitral Valve: The mitral valve is normal in structure. Normal mobility of the mitral valve leaflets. Mild mitral valve regurgitation. No evidence of mitral valve stenosis. Tricuspid Valve: The tricuspid valve is normal in structure. Tricuspid valve regurgitation is mild . No evidence of tricuspid stenosis. Aortic Valve: The aortic valve is tricuspid. Aortic valve regurgitation is not visualized. No aortic stenosis is present. Pulmonic Valve: The pulmonic valve was normal in structure. Pulmonic valve regurgitation is trivial. No evidence of pulmonic stenosis. Aorta: The aortic root is normal in size and structure. IAS/Shunts: No atrial level shunt detected by color flow Doppler.  TRICUSPID VALVE TR Peak grad:   49.0 mmHg TR Vmax:        350.00 cm/s Skeet Latch MD Electronically signed by Skeet Latch MD Signature Date/Time: 05/27/2019/5:12:39 PM    Final    [place] EKG SITE RITE  Result Date: 05/28/2019 If Site Rite image not attached, placement could not be confirmed due to current cardiac rhythm.   LOS: 7 days  Time spent: More than 50% of that time was spent in counseling and/or coordination of care. Antonieta Pert, MD [facility]  05/29/2019, 12:18 PM

## 2019-05-29 NOTE — Progress Notes (Addendum)
Progress Note: General Surgery Service   Chief Complaint/Subjective: Unable to get PICC placed due to persistent fevers. Patient complaining of food because of mechanical soft diet. Wants to go home.  Objective: Vital signs in last 24 hours: Temp:  [98.3 F (36.8 C)-101 F (38.3 C)] 98.3 F (36.8 C) (03/18 0347) Pulse Rate:  [71-80] 71 (03/18 0347) Resp:  [16-20] 20 (03/18 0347) BP: (91-142)/(42-65) 142/65 (03/18 0347) SpO2:  [91 %-97 %] 97 % (03/18 0347) Last BM Date: 05/27/19  Intake/Output from previous day: 03/17 0701 - 03/18 0700 In: 1452.5 [P.O.:960; I.V.:292.5; IV Piggyback:200] Out: 1425 [Urine:550; Drains:20; Stool:855] Intake/Output this shift: No intake/output data recorded.  Gen: NAD  Resp: nonlabored  Card: RRR  Abd: soft, NT, ND, Eakin's in place with moderate liquid output  Lab Results: CBC  Recent Labs    05/27/19 0429 05/28/19 0152  WBC 7.7 11.5*  HGB 7.5* 7.1*  HCT 23.8* 22.8*  PLT 344 390   BMET Recent Labs    05/27/19 0429 05/28/19 0152  NA 136 134*  K 3.8 4.1  CL 109 109  CO2 19* 18*  GLUCOSE 82 91  BUN 14 18  CREATININE 0.71 0.84  CALCIUM 8.2* 8.3*   PT/INR No results for input(s): LABPROT, INR in the last 72 hours. ABG No results for input(s): PHART, HCO3 in the last 72 hours.  Invalid input(s): PCO2, PO2  Anti-infectives: Anti-infectives (From admission, onward)   Start     Dose/Rate Route Frequency Ordered Stop   05/25/19 2200  vancomycin (VANCOCIN) IVPB 1000 mg/200 mL premix     1,000 mg 200 mL/hr over 60 Minutes Intravenous Every 24 hours 05/25/19 0958     05/24/19 2200  vancomycin (VANCOREADY) IVPB 750 mg/150 mL  Status:  Discontinued     750 mg 150 mL/hr over 60 Minutes Intravenous Every 24 hours 05/24/19 1156 05/25/19 0958   05/23/19 2200  vancomycin (VANCOCIN) IVPB 1000 mg/200 mL premix     1,000 mg 200 mL/hr over 60 Minutes Intravenous  Once 05/23/19 1345 05/24/19 0008   05/23/19 1113  vancomycin variable dose  per unstable renal function (pharmacist dosing)  Status:  Discontinued      Does not apply See admin instructions 05/23/19 1114 05/24/19 1156   05/22/19 1500  vancomycin (VANCOREADY) IVPB 1250 mg/250 mL  Status:  Discontinued     1,250 mg 166.7 mL/hr over 90 Minutes Intravenous Every 24 hours 05/22/19 1429 05/23/19 1114      Medications: Scheduled Meds: . amLODipine  5 mg Oral Daily  . chlorhexidine  15 mL Mouth Rinse BID  . Chlorhexidine Gluconate Cloth  6 each Topical Daily  . enoxaparin (LOVENOX) injection  40 mg Subcutaneous Q24H  . escitalopram  10 mg Oral Daily  . feeding supplement (ENSURE ENLIVE)  237 mL Oral TID BM  . feeding supplement (PRO-STAT SUGAR FREE 64)  30 mL Oral BID WC  . gabapentin  300 mg Oral BID  . insulin aspart  0-9 Units Subcutaneous Q6H  . mouth rinse  15 mL Mouth Rinse q12n4p  . Melatonin  3 mg Oral QHS  . metoprolol tartrate  50 mg Oral BID  . multivitamin with minerals  1 tablet Oral Daily  . nystatin  5 mL Oral QID  . pantoprazole  40 mg Oral Daily  . simethicone  80 mg Oral QID   Continuous Infusions: . dextrose 30 mL/hr at 05/28/19 1914  . vancomycin 1,000 mg (05/28/19 2152)   PRN Meds:.acetaminophen **OR** acetaminophen,  ondansetron **OR** ondansetron (ZOFRAN) IV, oxyCODONE  Assessment/Plan: s/p Procedure(s): TRANSESOPHAGEAL ECHOCARDIOGRAM (TEE) 05/27/2019  Perforated prepyloric ulcer S/pEXPLORATORY LAPAROTOMY WITH OVERSEW OF DUODENAL ULCER with [name] patch11/23/2020 Dr. 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 I think TPN would help to keep her hydrated at home given the persistent fistula output. Will increase medical therapy with imodium to decrease output.  ID - vancomycin 3/11>> VTE -SCDs, lovenox Foley -in place Follow up -TBD   LOS: 7 days   Mickeal Skinner, MD [facility]

## 2019-05-30 DIAGNOSIS — E440 Moderate protein-calorie malnutrition: Secondary | ICD-10-CM

## 2019-05-30 DIAGNOSIS — Z794 Long term (current) use of insulin: Secondary | ICD-10-CM

## 2019-05-30 DIAGNOSIS — E1165 Type 2 diabetes mellitus with hyperglycemia: Secondary | ICD-10-CM

## 2019-05-30 DIAGNOSIS — D638 Anemia in other chronic diseases classified elsewhere: Secondary | ICD-10-CM

## 2019-05-30 DIAGNOSIS — Z89519 Acquired absence of unspecified leg below knee: Secondary | ICD-10-CM

## 2019-05-30 LAB — TYPE AND SCREEN
ABO/RH(D): O POS
Antibody Screen: NEGATIVE
Unit division: 0
Unit division: 0

## 2019-05-30 LAB — BPAM RBC
Blood Product Expiration Date: 202104222359
Blood Product Expiration Date: 202104222359
ISSUE DATE / TIME: 202103181445
ISSUE DATE / TIME: 202103181841
Unit Type and Rh: 5100
Unit Type and Rh: 5100

## 2019-05-30 LAB — PHOSPHORUS: Phosphorus: 3.9 mg/dL (ref 2.5–4.6)

## 2019-05-30 LAB — COMPREHENSIVE METABOLIC PANEL
ALT: 14 U/L (ref 0–44)
AST: 12 U/L — ABNORMAL LOW (ref 15–41)
Albumin: 1.5 g/dL — ABNORMAL LOW (ref 3.5–5.0)
Alkaline Phosphatase: 123 U/L (ref 38–126)
Anion gap: 6 (ref 5–15)
BUN: 18 mg/dL (ref 6–20)
CO2: 19 mmol/L — ABNORMAL LOW (ref 22–32)
Calcium: 8.3 mg/dL — ABNORMAL LOW (ref 8.9–10.3)
Chloride: 108 mmol/L (ref 98–111)
Creatinine, Ser: 0.82 mg/dL (ref 0.44–1.00)
GFR calc Af Amer: >60 mL/min (ref >60)
GFR calc non Af Amer: >60 mL/min (ref >60)
Glucose, Bld: 152 mg/dL — ABNORMAL HIGH (ref 70–99)
Potassium: 4.4 mmol/L (ref 3.5–5.1)
Sodium: 133 mmol/L — ABNORMAL LOW (ref 135–145)
Total Bilirubin: 0.7 mg/dL (ref 0.3–1.2)
Total Protein: 6.6 g/dL (ref 6.5–8.1)

## 2019-05-30 LAB — GLUCOSE, CAPILLARY
Glucose-Capillary: 128 mg/dL — ABNORMAL HIGH (ref 70–99)
Glucose-Capillary: 130 mg/dL — ABNORMAL HIGH (ref 70–99)
Glucose-Capillary: 133 mg/dL — ABNORMAL HIGH (ref 70–99)
Glucose-Capillary: 171 mg/dL — ABNORMAL HIGH (ref 70–99)
Glucose-Capillary: 177 mg/dL — ABNORMAL HIGH (ref 70–99)
Glucose-Capillary: 182 mg/dL — ABNORMAL HIGH (ref 70–99)

## 2019-05-30 LAB — CBC
HCT: 31.7 % — ABNORMAL LOW (ref 36.0–46.0)
Hemoglobin: 10.2 g/dL — ABNORMAL LOW (ref 12.0–15.0)
MCH: 29.3 pg (ref 26.0–34.0)
MCHC: 32.2 g/dL (ref 30.0–36.0)
MCV: 91.1 fL (ref 80.0–100.0)
Platelets: 393 10*3/uL (ref 150–400)
RBC: 3.48 MIL/uL — ABNORMAL LOW (ref 3.87–5.11)
RDW: 16.2 % — ABNORMAL HIGH (ref 11.5–15.5)
WBC: 12.1 10*3/uL — ABNORMAL HIGH (ref 4.0–10.5)
nRBC: 0 % (ref 0.0–0.2)

## 2019-05-30 LAB — MAGNESIUM: Magnesium: 1.7 mg/dL (ref 1.7–2.4)

## 2019-05-30 MED ORDER — TRAVASOL 10 % IV SOLN
INTRAVENOUS | Status: AC
  Filled 2019-05-30: qty 739.2

## 2019-05-30 MED ORDER — MAGNESIUM SULFATE IN D5W 1-5 GM/100ML-% IV SOLN
1.0000 g | Freq: Once | INTRAVENOUS | Status: AC
  Administered 2019-05-30: 1 g via INTRAVENOUS
  Filled 2019-05-30: qty 100

## 2019-05-30 MED ORDER — INSULIN ASPART 100 UNIT/ML ~~LOC~~ SOLN
0.0000 [IU] | Freq: Four times a day (QID) | SUBCUTANEOUS | Status: DC
  Administered 2019-05-30 (×2): 3 [IU] via SUBCUTANEOUS
  Administered 2019-05-31 (×3): 2 [IU] via SUBCUTANEOUS
  Administered 2019-05-31: 3 [IU] via SUBCUTANEOUS
  Administered 2019-06-01: 5 [IU] via SUBCUTANEOUS
  Administered 2019-06-01: 2 [IU] via SUBCUTANEOUS
  Administered 2019-06-01 (×2): 3 [IU] via SUBCUTANEOUS
  Administered 2019-06-02: 2 [IU] via SUBCUTANEOUS
  Administered 2019-06-02 – 2019-06-05 (×4): 3 [IU] via SUBCUTANEOUS
  Administered 2019-06-05 – 2019-06-06 (×2): 2 [IU] via SUBCUTANEOUS
  Administered 2019-06-06 – 2019-06-07 (×3): 3 [IU] via SUBCUTANEOUS
  Administered 2019-06-08: 2 [IU] via SUBCUTANEOUS
  Administered 2019-06-08: 3 [IU] via SUBCUTANEOUS
  Administered 2019-06-09 (×2): 2 [IU] via SUBCUTANEOUS

## 2019-05-30 NOTE — Progress Notes (Signed)
PROGRESS NOTE    Gina [name]  QBH:[id number] DOB: [birth year] DOA: 05/22/2019 PCP: [name], MD   Brief Narrative: 59 year old female with DM, HTN, schizophrenia, bilateral BKA sent from [facility] due to positive blood culture.  Recent admission November 2020 for perforated prepyloric ulcer with septic shock with acute peritonitis status post exlap with [name] patch on 11/23, and was discharged to select LT[facility] on Unasyn Bactrim and antifungal for total 6 weeks.  Patient was discharged with Foley catheter, PICC line, bilateral lower quadrant drains along with enterocutaneous fistula with colostomy bag . She was discharged from [facility] to [facility] whiere she has been living for last 3 months. Patient  was sent to ER for vague sleepiness malaise anorexia low-grade fever and leukocytosis at SNF, and in the ER exam labs unremarkable and patient was sent back to SNF but overnight blood culture returned MRSA 2/2 and she was readmitted to the hospital.  Patient is being followed by infectious disease, general surgery.She did well with speech, diet was restarted, PICC line was removed as no longer needing TPN and also needing line holidays given her MRSA bacteremia.  Foley catheter was exchanged.  Repeat blood culture negative from 3/2.  ID has advised to continue vancomycin through 3/25 and ordered PICC line 3/17. Surgery has been following patient and on calorie count, her intestinal obstruction is not reliable so if it is not ideal and if not able to meet calorie count will likely need to go back on TPN.  Patient and family planning to return home with home health services upon discharge and has declined skilled nursing facility placement.   Patient has a PICC placed 3/18-started back on TPN.  Subjective:  Alert awake oriented not in acute distress. " I am not going to any nursing facility and going back home" No episodes of fever.  Blood sugar trending up on TPN-discussed with the pharmacy is  being monitored for next 24 hours to see patient needs to have insulin placed on her TPN bag No more fever spikes in 3/17   Assessment & Plan:  MRSA bacteremia: indwelling PICC line used for TPN is felt likely source and PICC is out. Appreciate ID on board. For TEE 3/16. Repeat blood cultures 3/12- Negative so far.TTE negative for endocarditis.  Appreciate ID input, status post PICC placed 3/18 and continue vancomycin through 3/25.   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  Continue on increased  Imodium to decrease for stool output-appreciate surgery input.  Moderate protein-calorie malnutrition: was TPN dependent, dietitian on consult.  Now on oral diet and calorie count-surgery planning to address with TPN if unable to meet her requirement, as her intestinal obstruction is questionable and not ideal candidate for tube feeding.  Per surgery patient is back on TPN, who is the clinical pharmacist today monitoring for next 24 hours to see patient is to have insulin placed on her TPN bag as blood sugar trending up and has required insulin previously  Bilateral stump wounds continue wound care and dressing.  Recent admission November 2020 for perforated prepyloric ulcer with septic shock with acute peritonitis status post exlap with [name] patch on 11/23-patient undergoing bilateral lower quadrant drain, enterocutaneous fistula with colostomy bag.  Upper GI shows fistulous connection at 2 points in the small bowel (proximal small bowel, suspect 1st site  of communication is proximal jejunum) connecting to the known collection in the anterior abdomen and io the skin.  May need drain injection to further characterize fistula in the future as well as EGD per surgery and  will need to follow-up as outpatient  HTN:BP well controlled, cont amlodipine.  Metabolic acidosis/elevated BUN/dehydration: Dehydration likely in the setting of EC fistula output.  Tolerating diet-gentle IV fluid hydration. bUN has normalized from 46-->14. She is off ivf.   Diabetes mellitus: hbA1c 5.9 3/12. She had hypoglycemia-blood sugar 67  (3/12)- stopped lantus and on ssi.  Now blood sugar trending up as we will start her on TPN.  May need to be back on insulin so monitor. Recent Labs  Lab 05/29/19 1651 05/29/19 2125 05/30/19 0009 05/30/19 0449 05/30/19 0729  GLUCAP 81 116* 130* 128* 171*   Anemia of chronic disease: Previously hemoglobin has been ranging from 9 to 8 g, and drifting downward.  6.7 g today patient appears weak and symptomatic.  Discussed next benefits and agrees for transfusion s/p 2 unit PRBC, h/h up in 10 gm. Anemia panel ferritin 646, iron low at 22, B12 750, folate 22. Hemoccult was negative. No Signs of active bleeding.  Monitor closely. Recent Labs  Lab 05/26/19 0354 05/27/19 0429 05/28/19 0152 05/29/19 1003 05/30/19 0903  HGB 7.5* 7.5* 7.1* 6.7* 10.2*  HCT 24.1* 23.8* 22.8* 21.6* 31.7*   Below-knee amputee/Atherosclerosis of native arteries: Stump evaluation by wound care for her ilateral stump wounds   Transaminitis: Suspect from TPN.  Monitor closely.  Asymptomatic bacteriuria: replaceD Foley after admission.  GERD: on PPI  Mood disorder:on Celexa  Goals of care: Palliative care consulted patient is DNR MOST form filled.Per recommendation to treat what is treatable and plan on palliative care referral upon discharge.Given significant comorbidities malnutrition patient is at high risk for readmission and decompensation.  Interventions: Refer to RD note for recommendations Body mass index is 24.19 kg/m.  Pressure Ulcer: Pressure Injury 05/22/19 Coccyx Bilateral Stage 2 -  Partial thickness loss of dermis presenting as a shallow open injury with  a red, pink wound bed without slough. (Active)  05/22/19 2104  Location: Coccyx  Location Orientation: Bilateral  Staging: Stage 2 -  Partial thickness loss of dermis presenting as a shallow open injury with a red, pink wound bed without slough.  Wound Description (Comments):   Present on Admission: Yes     Pressure Injury 05/22/19 Knee Right;Distal Unstageable - Full thickness tissue loss in which the base of the injury is covered by slough (yellow, tan, gray, green or brown) and/or eschar (tan, brown or black) in the wound bed. Stump (Active)  05/22/19 2106  Location: Knee  Location Orientation: Right;Distal  Staging: Unstageable - Full thickness tissue loss in which the base of the injury is covered by slough (yellow, tan, gray, green or brown) and/or eschar (tan, brown or black) in the wound bed.  Wound Description (Comments): Stump  Present on Admission: Yes     Pressure Injury 05/22/19 Knee Left;Distal Unstageable - Full thickness tissue loss in which the base of the injury is covered by slough (yellow, tan, gray, green or brown) and/or eschar (tan, brown or black) in the wound bed. Stump (Active)  05/22/19 2108  Location: Knee  Location Orientation: Left;Distal  Staging: Unstageable - Full thickness tissue loss in which the base of the injury is covered by slough (yellow, tan, gray, green or brown) and/or eschar (tan, brown or black) in the wound bed.  Wound Description (  Comments): Stump  Present on Admission: Yes     Pressure Injury 05/22/19 Thigh Left;Mid Stage 2 -  Partial thickness loss of dermis presenting as a shallow open injury with a red, pink wound bed without slough. (Active)  05/22/19 2109  Location: Thigh  Location Orientation: Left;Mid  Staging: Stage 2 -  Partial thickness loss of dermis presenting as a shallow open injury with a red, pink wound bed without slough.  Wound Description (Comments):   Present on Admission: Yes   DVT prophylaxis:lovenox Code Status:  DNR Family Communication: plan of care discussed with patient at bedside. Called her daughter [name] no answer-will reattempt. Disposition Plan: Patient is from: SNF Anticipated Disposition: to home with home and services.  Patient reports she is not going back to skilled nursing facility.   Barriers to discharge or conditions that needs to be met prior to discharge: Admitted for MRSA bacteremia treatment, and protein calorie malnutrition-off TPN and a PICC line due to bacteremia and now back on new PICC line with TPN as oral intake not reliable-patient has been recommended for a skilled nursing facility but she has declined and wants to return home with daughter, difficult disposition, CM following for feasibility pf home health services given her needs:TPN/IV vancomycin.Monitoring for insulin requirement for next 24 hours while on TPN.  Consultants: Infectious disease, general surgery Procedures:  2D ECHO 1. Left ventricular ejection fraction, by estimation, is 55 to 60%. The  left ventricle has normal function. The left ventricle has no regional  wall motion abnormalities. Left ventricular diastolic parameters were  normal.  2. Right ventricular systolic function is normal. The right ventricular  size is normal.  3. The mitral valve is normal in structure. No evidence of mitral valve  regurgitation. No evidence of mitral stenosis.  4. The aortic valve is normal in structure. Aortic valve regurgitation is  not visualized. No aortic stenosis is present.   Microbiology: Blood culture with MRSA 05/21/19  Medications: Scheduled Meds: . amLODipine  5 mg Oral Daily  . chlorhexidine  15 mL Mouth Rinse BID  . Chlorhexidine Gluconate Cloth  6 each Topical Daily  . enoxaparin (LOVENOX) injection  40 mg Subcutaneous Q24H  . escitalopram  10 mg Oral Daily  . gabapentin  300 mg Oral BID  . insulin aspart  0-15 Units Subcutaneous Q6H  . loperamide  2 mg Oral Q8H  . mouth rinse  15 mL Mouth  Rinse q12n4p  . Melatonin  3 mg Oral QHS  . metoprolol tartrate  50 mg Oral BID  . multivitamin with minerals  1 tablet Oral Daily  . nystatin  5 mL Oral QID  . pantoprazole  40 mg Oral Daily  . simethicone  80 mg Oral QID   Continuous Infusions: . magnesium sulfate bolus IVPB 1 g (05/30/19 1127)  . TPN ADULT (ION) 40 mL/hr at 05/29/19 1741  . TPN ADULT (ION)    . vancomycin 1,000 mg (05/29/19 2354)    Antimicrobials: Anti-infectives (From admission, onward)   Start     Dose/Rate Route Frequency Ordered Stop   05/25/19 2200  vancomycin (VANCOCIN) IVPB 1000 mg/200 mL premix     1,000 mg 200 mL/hr over 60 Minutes Intravenous Every 24 hours 05/25/19 0958     05/24/19 2200  vancomycin (VANCOREADY) IVPB 750 mg/150 mL  Status:  Discontinued     750 mg 150 mL/hr over 60 Minutes Intravenous Every 24 hours 05/24/19 1156 05/25/19 0958   05/23/19 2200  vancomycin (VANCOCIN) IVPB  1000 mg/200 mL premix     1,000 mg 200 mL/hr over 60 Minutes Intravenous  Once 05/23/19 1345 05/24/19 0008   05/23/19 1113  vancomycin variable dose per unstable renal function (pharmacist dosing)  Status:  Discontinued      Does not apply See admin instructions 05/23/19 1114 05/24/19 1156   05/22/19 1500  vancomycin (VANCOREADY) IVPB 1250 mg/250 mL  Status:  Discontinued     1,250 mg 166.7 mL/hr over 90 Minutes Intravenous Every 24 hours 05/22/19 1429 05/23/19 1114     Objective: Vitals: Today's Vitals   05/30/19 0541 05/30/19 0548 05/30/19 0608 05/30/19 0943  BP:    (!) 152/69  Pulse:    75  Resp:    16  Temp:    99.4 F (37.4 C)  TempSrc:    Oral  SpO2:    95%  Weight:      Height:      PainSc: 0-No pain 0-No pain 0-No pain     Intake/Output Summary (Last 24 hours) at 05/30/2019 1153 Last data filed at 05/30/2019 0946 Gross per 24 hour  Intake 2259.6 ml  Output 1000 ml  Net 1259.6 ml   Filed Weights   05/27/19 1140 05/28/19 0438 05/29/19 2128  Weight: 54.6 kg 53 kg 60 kg   Weight change:     Intake/Output from previous day: 03/18 0701 - 03/19 0700 In: 2259.6 [P.O.:460; I.V.:773.6; Blood:426; IV Piggyback:600] Out: 1275 [Urine:1275] Intake/Output this shift: Total I/O In: 240 [P.O.:240] Out: 150 [Urine:150]  Examination:  General exam: Not awake frail, cachectic and thin, with temporal wasting.   HEENT:Oral mucosa moist, Ear/Nose WNL grossly,dentition normal. Respiratory system: b/l clearno use of accessory muscle, non tender. Cardiovascular system: S1 & S2 +, regular, No JVD. Gastrointestinal system: Abdomen soft, drain present on the left side EC fistula with colostomy in place, foley+   Nervous System:Alert, awake, moving extremities and grossly nonfocal Extremities: No edema, distal peripheral pulses palpable.  Skin: No rashes,no icterus. MSK: BKA bilaterally with a stump with wounds.    Data Reviewed: I have personally reviewed following labs and imaging studies CBC: Recent Labs  Lab 05/26/19 0354 05/27/19 0429 05/28/19 0152 05/29/19 1003 05/30/19 0903  WBC 8.1 7.7 11.5* 8.2 12.1*  NEUTROABS  --   --   --  5.5  --   HGB 7.5* 7.5* 7.1* 6.7* 10.2*  HCT 24.1* 23.8* 22.8* 21.6* 31.7*  MCV 96.4 96.7 98.3 96.4 91.1  PLT 300 344 390 397 017   Basic Metabolic Panel: Recent Labs  Lab 05/26/19 0354 05/27/19 0429 05/28/19 0152 05/29/19 1003 05/30/19 0407  NA 136 136 134* 131* 133*  K 3.7 3.8 4.1 4.3 4.4  CL 108 109 109 106 108  CO2 17* 19* 18* 18* 19*  GLUCOSE 139* 82 91 93 152*  BUN 18 14 18  21* 18  CREATININE 0.83 0.71 0.84 0.97 0.82  CALCIUM 8.3* 8.2* 8.3* 8.1* 8.3*  MG  --   --   --  1.7 1.7  PHOS  --   --   --  4.3 3.9   GFR: Estimated Creatinine Clearance: 59.1 mL/min (by C-G formula based on SCr of 0.82 mg/dL). Liver Function Tests: Recent Labs  Lab 05/26/19 0354 05/27/19 0429 05/28/19 0152 05/29/19 1003 05/30/19 0407  AST 19 18 15  14* 12*  ALT 23 21 17 15 14   ALKPHOS 152* 135* 137* 128* 123  BILITOT 0.8 0.5 0.8 0.8 0.7  PROT 6.9  6.4* 6.7 6.5 6.6  ALBUMIN  1.6* 1.5* 1.5* 1.5* 1.5*   No results for input(s): LIPASE, AMYLASE in the last 168 hours. No results for input(s): AMMONIA in the last 168 hours. Coagulation Profile: No results for input(s): INR, PROTIME in the last 168 hours. Cardiac Enzymes: No results for input(s): CKTOTAL, CKMB, CKMBINDEX, TROPONINI in the last 168 hours. BNP (last 3 results) No results for input(s): PROBNP in the last 8760 hours. HbA1C: No results for input(s): HGBA1C in the last 72 hours. CBG: Recent Labs  Lab 05/29/19 1651 05/29/19 2125 05/30/19 0009 05/30/19 0449 05/30/19 0729  GLUCAP 81 116* 130* 128* 171*   Lipid Profile: Recent Labs    05/29/19 1003  TRIG 181*   Thyroid Function Tests: No results for input(s): TSH, T4TOTAL, FREET4, T3FREE, THYROIDAB in the last 72 hours. Anemia Panel: No results for input(s): VITAMINB12, FOLATE, FERRITIN, TIBC, IRON, RETICCTPCT in the last 72 hours. Sepsis Labs: No results for input(s): PROCALCITON, LATICACIDVEN in the last 168 hours.  Recent Results (from the past 240 hour(s))  Urine Culture     Status: Abnormal   Collection Time: 05/21/19 10:00 AM   Specimen: Urine, Catheterized  Result Value Ref Range Status   Specimen Description URINE, CATHETERIZED  Final   Special Requests   Final    NONE Performed at [facility], [address] [place], [place] [address]    Culture (A)  Final    >=100,000 COLONIES/mL PSEUDOMONAS AERUGINOSA 50,000 COLONIES/mL VANCOMYCIN RESISTANT ENTEROCOCCUS ISOLATED    Report Status 05/24/2019 FINAL  Final   Organism ID, Bacteria PSEUDOMONAS AERUGINOSA (A)  Final   Organism ID, Bacteria VANCOMYCIN RESISTANT ENTEROCOCCUS ISOLATED (A)  Final      Susceptibility   Pseudomonas aeruginosa - MIC*    CEFTAZIDIME 4 SENSITIVE Sensitive     CIPROFLOXACIN <=0.25 SENSITIVE Sensitive     GENTAMICIN <=1 SENSITIVE Sensitive     IMIPENEM 1 SENSITIVE Sensitive     PIP/TAZO <=4 SENSITIVE Sensitive      CEFEPIME 2 SENSITIVE Sensitive     * >=100,000 COLONIES/mL PSEUDOMONAS AERUGINOSA   Vancomycin resistant enterococcus isolated - MIC*    AMPICILLIN >=32 RESISTANT Resistant     NITROFURANTOIN 256 RESISTANT Resistant     VANCOMYCIN >=32 RESISTANT Resistant     LINEZOLID 2 SENSITIVE Sensitive     * 50,000 COLONIES/mL VANCOMYCIN RESISTANT ENTEROCOCCUS ISOLATED  Respiratory Panel by RT PCR (Flu A&B, Covid) - Nasopharyngeal Swab     Status: None   Collection Time: 05/21/19 10:03 AM   Specimen: Nasopharyngeal Swab  Result Value Ref Range Status   SARS Coronavirus 2 by RT PCR NEGATIVE NEGATIVE Final    Comment: (NOTE) SARS-CoV-2 target nucleic acids are NOT DETECTED. The SARS-CoV-2 RNA is generally detectable in upper respiratoy specimens during the acute phase of infection. The lowest concentration of SARS-CoV-2 viral copies this assay can detect is 131 copies/mL. A negative result does not preclude SARS-Cov-2 infection and should not be used as the sole basis for treatment or other patient management decisions. A negative result may occur with  improper specimen collection/handling, submission of specimen other than nasopharyngeal swab, presence of viral mutation(s) within the areas targeted by this assay, and inadequate number of viral copies (<131 copies/mL). A negative result must be combined with clinical observations, patient history, and epidemiological information. The expected result is Negative. Fact Sheet for Patients:  PinkCheek.be Fact Sheet for Healthcare Providers:  GravelBags.it This test is not yet ap proved or cleared by the [place] and  has been authorized for detection and/or diagnosis of SARS-CoV-2 by FDA under an Emergency Use Authorization (EUA). This EUA will remain  in effect (meaning this test can be used) for the duration of the COVID-19 declaration under Section 564(b)(1) of the Act, 21  U.S.C. section 360bbb-3(b)(1), unless the authorization is terminated or revoked sooner.    Influenza A by PCR NEGATIVE NEGATIVE Final   Influenza B by PCR NEGATIVE NEGATIVE Final    Comment: (NOTE) The Xpert Xpress SARS-CoV-2/FLU/RSV assay is intended as an aid in  the diagnosis of influenza from Nasopharyngeal swab specimens and  should not be used as a sole basis for treatment. Nasal washings and  aspirates are unacceptable for Xpert Xpress SARS-CoV-2/FLU/RSV  testing. Fact Sheet for Patients: PinkCheek.be Fact Sheet for Healthcare Providers: GravelBags.it This test is not yet approved or cleared by the [place] FDA and  has been authorized for detection and/or diagnosis of SARS-CoV-2 by  FDA under an Emergency Use Authorization (EUA). This EUA will remain  in effect (meaning this test can be used) for the duration of the  Covid-19 declaration under Section 564(b)(1) of the Act, 21  U.S.C. section 360bbb-3(b)(1), unless the authorization is  terminated or revoked. Performed at [facility], [place] [address], [place], [place] [address]   Blood culture (routine x 2)     Status: Abnormal   Collection Time: 05/21/19 10:38 AM   Specimen: BLOOD RIGHT ARM  Result Value Ref Range Status   Specimen Description BLOOD RIGHT ARM  Final   Special Requests   Final    AEROBIC BOTTLE ONLY Blood Culture results may not be optimal due to an inadequate volume of blood received in culture bottles   Culture  Setup Time   Final    GRAM POSITIVE COCCI IN CLUSTERS AEROBIC BOTTLE ONLY CRITICAL RESULT CALLED TO, READ BACK BY AND VERIFIED WITH: Macky Lower RN, AT [phone] 05/22/19 BY Rush Landmark Performed at [facility], [place] [address] [place], [place] [address]    Culture METHICILLIN RESISTANT STAPHYLOCOCCUS AUREUS (A)  Final   Report Status 05/24/2019 FINAL  Final   Organism ID, Bacteria METHICILLIN RESISTANT STAPHYLOCOCCUS AUREUS   Final      Susceptibility   Methicillin resistant staphylococcus aureus - MIC*    CIPROFLOXACIN >=8 RESISTANT Resistant     ERYTHROMYCIN <=0.25 SENSITIVE Sensitive     GENTAMICIN <=0.5 SENSITIVE Sensitive     OXACILLIN >=4 RESISTANT Resistant     TETRACYCLINE <=1 SENSITIVE Sensitive     VANCOMYCIN 1 SENSITIVE Sensitive     TRIMETH/SULFA <=10 SENSITIVE Sensitive     CLINDAMYCIN <=0.25 SENSITIVE Sensitive     RIFAMPIN <=0.5 SENSITIVE Sensitive     Inducible Clindamycin NEGATIVE Sensitive     * METHICILLIN RESISTANT STAPHYLOCOCCUS AUREUS  Blood culture (routine x 2)     Status: Abnormal   Collection Time: 05/21/19 10:38 AM   Specimen: BLOOD RIGHT HAND  Result Value Ref Range Status   Specimen Description BLOOD RIGHT HAND  Final   Special Requests   Final    AEROBIC BOTTLE ONLY Blood Culture results may not be optimal due to an inadequate volume of blood received in culture bottles   Culture  Setup Time   Final    AEROBIC BOTTLE ONLY GRAM POSITIVE COCCI IN CLUSTERS CRITICAL RESULT CALLED TO, READ BACK BY AND VERIFIED WITH: K NEAL RN 05/22/19 0137 JDW    Culture (A)  Final    STAPHYLOCOCCUS AUREUS SUSCEPTIBILITIES PERFORMED  ON PREVIOUS CULTURE WITHIN THE LAST 5 DAYS. Performed at [facility], [place] [address], [place], [place] [address]    Report Status 05/24/2019 FINAL  Final  Blood Culture ID Panel (Reflexed)     Status: Abnormal   Collection Time: 05/21/19 10:38 AM  Result Value Ref Range Status   Enterococcus species NOT DETECTED NOT DETECTED Final   Listeria monocytogenes NOT DETECTED NOT DETECTED Final   Staphylococcus species DETECTED (A) NOT DETECTED Final    Comment: CRITICAL RESULT CALLED TO, READ BACK BY AND VERIFIED WITH: S. COBLE RN, AT [phone] 05/22/19 BY D. VANHOOK    Staphylococcus aureus (BCID) DETECTED (A) NOT DETECTED Final    Comment: Methicillin (oxacillin)-resistant Staphylococcus aureus (MRSA). MRSA is predictably resistant to beta-lactam antibiotics (except  ceftaroline). Preferred therapy is vancomycin unless clinically contraindicated. Patient requires contact precautions if  hospitalized. CRITICAL RESULT CALLED TO, READ BACK BY AND VERIFIED WITH: S. COBLE RN, AT [phone] 05/22/19 BY D. VANHOOK    Methicillin resistance DETECTED (A) NOT DETECTED Final    Comment: CRITICAL RESULT CALLED TO, READ BACK BY AND VERIFIED WITH: S. COBLE RN, AT [phone] 05/22/19 BY D. VANHOOK    Streptococcus species NOT DETECTED NOT DETECTED Final   Streptococcus agalactiae NOT DETECTED NOT DETECTED Final   Streptococcus pneumoniae NOT DETECTED NOT DETECTED Final   Streptococcus pyogenes NOT DETECTED NOT DETECTED Final   Acinetobacter baumannii NOT DETECTED NOT DETECTED Final   Enterobacteriaceae species NOT DETECTED NOT DETECTED Final   Enterobacter cloacae complex NOT DETECTED NOT DETECTED Final   Escherichia coli NOT DETECTED NOT DETECTED Final   Klebsiella oxytoca NOT DETECTED NOT DETECTED Final   Klebsiella pneumoniae NOT DETECTED NOT DETECTED Final   Proteus species NOT DETECTED NOT DETECTED Final   Serratia marcescens NOT DETECTED NOT DETECTED Final   Haemophilus influenzae NOT DETECTED NOT DETECTED Final   Neisseria meningitidis NOT DETECTED NOT DETECTED Final   Pseudomonas aeruginosa NOT DETECTED NOT DETECTED Final   Candida albicans NOT DETECTED NOT DETECTED Final   Candida glabrata NOT DETECTED NOT DETECTED Final   Candida krusei NOT DETECTED NOT DETECTED Final   Candida parapsilosis NOT DETECTED NOT DETECTED Final   Candida tropicalis NOT DETECTED NOT DETECTED Final    Comment: Performed at [facility], [place] [address] [place], [place] [address]  SARS CORONAVIRUS 2 (TAT 6-24 HRS) Nasopharyngeal Nasopharyngeal Swab     Status: None   Collection Time: 05/22/19  2:45 PM   Specimen: Nasopharyngeal Swab  Result Value Ref Range Status   SARS Coronavirus 2 NEGATIVE NEGATIVE Final    Comment: (NOTE) SARS-CoV-2 target nucleic acids are NOT DETECTED. The  SARS-CoV-2 RNA is generally detectable in upper and lower respiratory specimens during the acute phase of infection. Negative results do not preclude SARS-CoV-2 infection, do not rule out co-infections with other pathogens, and should not be used as the sole basis for treatment or other patient management decisions. Negative results must be combined with clinical observations, patient history, and epidemiological information. The expected result is Negative. Fact Sheet for Patients: SugarRoll.be Fact Sheet for Healthcare Providers: https://www.woods-mathews.com/ This test is not yet approved or cleared by the [place] FDA and  has been authorized for detection and/or diagnosis of SARS-CoV-2 by FDA under an Emergency Use Authorization (EUA). This EUA will remain  in effect (meaning this test can be used) for the duration of the COVID-19 declaration under Section 56 4(b)(1) of the Act, 21 U.S.C. section 360bbb-3(b)(1), unless the authorization  is terminated or revoked sooner. Performed at [facility], [place] [address], [place], [place] [address]   Culture, blood (routine x 2)     Status: None   Collection Time: 05/23/19  5:34 AM   Specimen: BLOOD  Result Value Ref Range Status   Specimen Description BLOOD LEFT ARM  Final   Special Requests   Final    BOTTLES DRAWN AEROBIC AND ANAEROBIC Blood Culture results may not be optimal due to an excessive volume of blood received in culture bottles   Culture   Final    NO GROWTH 5 DAYS Performed at [facility], [place] [address] [place], [place] [address]    Report Status 05/28/2019 FINAL  Final  Culture, blood (routine x 2)     Status: None   Collection Time: 05/23/19  5:42 AM   Specimen: BLOOD  Result Value Ref Range Status   Specimen Description BLOOD LEFT HAND  Final   Special Requests   Final    BOTTLES DRAWN AEROBIC AND ANAEROBIC Blood Culture adequate volume   Culture   Final      NO GROWTH 5 DAYS Performed at [facility], [place] [address], [place], [place] [address]    Report Status 05/28/2019 FINAL  Final      Radiology Studies: No results found.  LOS: 8 days  Time spent: More than 50% of that time was spent in counseling and/or coordination of care. Antonieta Pert, MD [facility]  05/30/2019, 11:53 AM

## 2019-05-30 NOTE — Progress Notes (Signed)
Occupational Therapy Treatment Patient Details Name: Gina [name] MRN: [id number] DOB: [birth year] Today's Date: 05/30/2019    History of present illness Pt is a 59 y/o female admitted from SNF secondary to abnormal lab values. Found to have MRSA bacteremia. Pt also with abdominal fistula and multiple abdominal drains secondary to previous complicated hospital admission. Pt now s/p 1 unit PRBC 3/18. PMH includes HTN, DM, bilateral BKA, and schizophrenia.   OT comments  Pt making slow progress with functional goals. Pt initially reluctant to participate with therapy, but agreeable once OT/PT explained role of assisting to get her home with family safely. Pt required mod A for rolling in bed for pericare/hygiene total A, total - max A +2 to sit EOB, min guard A for simple grooming/hygiene, min - min guard A with simulated bathing and UB dressing tasks. Pt sat EOB x ~ 10 minutes with Fair balance with min guard A. Total A +2 to return pt to supine in bed and to position. OT will continue to follow acutely  Follow Up Recommendations  SNF;Supervision/Assistance - 24 hour(pt/family refusing SNF)    Equipment Recommendations  Wheelchair (measurements OT);Wheelchair cushion (measurements OT);Hospital bed;Other (comment)(hoyer lift)    Recommendations for Other Services      Precautions / Restrictions Precautions Precautions: Fall;Other (comment) Precaution Comments: bilateral BKA  Restrictions Weight Bearing Restrictions: No       Mobility Bed Mobility Overal bed mobility: Needs Assistance Bed Mobility: Rolling;Sidelying to Sit;Sit to Supine;Supine to Sit Rolling: Mod assist Sidelying to sit: Max assist;+2 for physical assistance;HOB elevated Supine to sit: Total assist;+2 for physical assistance;HOB elevated Sit to supine: Total assist;+2 for physical assistance;HOB elevated   General bed mobility comments: rolling to L and R for pericare and hygiene, pt initiates movement, needs mod A  to complete movement. Initially trialed sidelying to sit, pt requiring maxA of 2 to sit EOB, pt reported this caused sig pain and immediately laid back down. Then trialed transition with use of pads, required totalA of 2 but was easier to facilitate and pt reported no pain.  Transfers Overall transfer level: (pt unable, used hoyer at baseline)               General transfer comment: Dependent at baseline     Balance Overall balance assessment: Needs assistance Sitting-balance support: Single extremity supported;Feet supported Sitting balance-Leahy Scale: Fair Sitting balance - Comments: pt able to maintain balance without support with BUE support, min guard A     Standing balance-Leahy Scale: Zero Standing balance comment: pt unable                           ADL either performed or assessed with clinical judgement   ADL Overall ADL's : Needs assistance/impaired     Grooming: Wash/dry hands;Wash/dry face;Sitting;Min guard   Upper Body Bathing: Minimal assistance;Min guard Upper Body Bathing Details (indicate cue type and reason): simulated Lower Body Bathing: Maximal assistance;Moderate assistance;Sitting/lateral leans Lower Body Bathing Details (indicate cue type and reason): simulated Upper Body Dressing : Minimal assistance;Min guard;Standing           Toileting- Clothing Manipulation and Hygiene: Total assistance;Bed level         General ADL Comments: pt sat EOB x 10 minutes     Vision Patient Visual Report: No change from baseline     Perception     Praxis      Cognition Arousal/Alertness: Awake/alert Behavior During Therapy: WFL for tasks assessed/performed  Overall Cognitive Status: No family/caregiver present to determine baseline cognitive functioning                                 General Comments: pt initially voicing some furstration or hesitation to get up with PT, cooperative and agreeable to mobiltiy when educated  on my role in her return to live at home and my goal of making the transition easier for her and her family.        Exercises     Shoulder Instructions       General Comments      Pertinent Vitals/ Pain       Pain Assessment: Faces Faces Pain Scale: Hurts even more Pain Location: buttocks with pressure (when rolling or sitting) Pain Descriptors / Indicators: Grimacing;Guarding;Crying;Discomfort;Sore Pain Intervention(s): Limited activity within patient's tolerance;Monitored during session;Repositioned;Premedicated before session  Home Living Family/patient expects to be discharged to:: Private residence Living Arrangements: Children;Other relatives Available Help at Discharge: Family;Available 24 hours/day   Home Access: Level entry     Home Layout: One level               Home Equipment: Walker - 2 wheels;Bedside commode;Shower seat   Additional Comments: pt from SNF, now pt and family wanting pt to return home with family. Per chart, family will be with pt and providing assist as needed.      Prior Functioning/Environment Level of Independence: Needs assistance  Gait / Transfers Assistance Needed: Requires hoyer lift for transfers.  ADL's / Homemaking Assistance Needed: Requires assist with bathing/dressing at SNF    Comments: pt will now be returning with family to home vs returning to SNF   Frequency  Min 2X/week        Progress Toward Goals  OT Goals(current goals can now be found in the care plan section)  Progress towards OT goals: Progressing toward goals  Acute Rehab OT Goals Patient Stated Goal: wants to go home  Plan Discharge plan remains appropriate    Co-evaluation    PT/OT/SLP Co-Evaluation/Treatment: Yes Reason for Co-Treatment: Complexity of the patient's impairments (multi-system involvement);For patient/therapist safety;To address functional/ADL transfers   OT goals addressed during session: ADL's and self-care      AM-PAC OT  "6 Clicks" Daily Activity     Outcome Measure   Help from another person eating meals?: A Little Help from another person taking care of personal grooming?: A Little Help from another person toileting, which includes using toliet, bedpan, or urinal?: Total Help from another person bathing (including washing, rinsing, drying)?: A Lot Help from another person to put on and taking off regular upper body clothing?: A Little Help from another person to put on and taking off regular lower body clothing?: Total 6 Click Score: 13    End of Session    OT Visit Diagnosis: Muscle weakness (generalized) (M62.81);Pain;Other abnormalities of gait and mobility (R26.89) Pain - part of body: (buttocks)   Activity Tolerance Patient tolerated treatment well   Patient Left in bed;with call bell/phone within reach;with nursing/sitter in room   Nurse Communication Mobility status        Time: 1128-1206 OT Time Calculation (min): 38 min  Charges: OT General Charges $OT Visit: 1 Visit OT Treatments $Self Care/Home Management : 8-22 mins     Britt Bottom 05/30/2019, 1:58 PM

## 2019-05-30 NOTE — Progress Notes (Signed)
Progress Note: General Surgery Service   Chief Complaint/Subjective: Did not sleep well  Objective: Vital signs in last 24 hours: Temp:  [97.7 F (36.5 C)-99.2 F (37.3 C)] 99.2 F (37.3 C) (03/19 0447) Pulse Rate:  [67-79] 78 (03/19 0447) Resp:  [14-20] 14 (03/19 0447) BP: (102-154)/(53-76) 147/61 (03/19 0447) SpO2:  [91 %-97 %] 91 % (03/19 0447) Weight:  [60 kg] 60 kg (03/18 2128) Last BM Date: 05/29/19  Intake/Output from previous day: 03/18 0701 - 03/19 0700 In: 2259.6 [P.O.:460; I.V.:773.6; Blood:426; IV Piggyback:600] Out: 1275 [Urine:1275] Intake/Output this shift: No intake/output data recorded.  Gen: somnolent but arousable  Resp: nonlabored  Card: RRR  Abd: soft, ECF with some gas and small amount of fluid in bag  Lab Results: CBC  Recent Labs    05/28/19 0152 05/29/19 1003  WBC 11.5* 8.2  HGB 7.1* 6.7*  HCT 22.8* 21.6*  PLT 390 397   BMET Recent Labs    05/29/19 1003 05/30/19 0407  NA 131* 133*  K 4.3 4.4  CL 106 108  CO2 18* 19*  GLUCOSE 93 152*  BUN 21* 18  CREATININE 0.97 0.82  CALCIUM 8.1* 8.3*   PT/INR No results for input(s): LABPROT, INR in the last 72 hours. ABG No results for input(s): PHART, HCO3 in the last 72 hours.  Invalid input(s): PCO2, PO2  Anti-infectives: Anti-infectives (From admission, onward)   Start     Dose/Rate Route Frequency Ordered Stop   05/25/19 2200  vancomycin (VANCOCIN) IVPB 1000 mg/200 mL premix     1,000 mg 200 mL/hr over 60 Minutes Intravenous Every 24 hours 05/25/19 0958     05/24/19 2200  vancomycin (VANCOREADY) IVPB 750 mg/150 mL  Status:  Discontinued     750 mg 150 mL/hr over 60 Minutes Intravenous Every 24 hours 05/24/19 1156 05/25/19 0958   05/23/19 2200  vancomycin (VANCOCIN) IVPB 1000 mg/200 mL premix     1,000 mg 200 mL/hr over 60 Minutes Intravenous  Once 05/23/19 1345 05/24/19 0008   05/23/19 1113  vancomycin variable dose per unstable renal function (pharmacist dosing)  Status:   Discontinued      Does not apply See admin instructions 05/23/19 1114 05/24/19 1156   05/22/19 1500  vancomycin (VANCOREADY) IVPB 1250 mg/250 mL  Status:  Discontinued     1,250 mg 166.7 mL/hr over 90 Minutes Intravenous Every 24 hours 05/22/19 1429 05/23/19 1114      Medications: Scheduled Meds: . amLODipine  5 mg Oral Daily  . chlorhexidine  15 mL Mouth Rinse BID  . Chlorhexidine Gluconate Cloth  6 each Topical Daily  . enoxaparin (LOVENOX) injection  40 mg Subcutaneous Q24H  . escitalopram  10 mg Oral Daily  . gabapentin  300 mg Oral BID  . insulin aspart  0-15 Units Subcutaneous Q6H  . loperamide  2 mg Oral Q8H  . mouth rinse  15 mL Mouth Rinse q12n4p  . Melatonin  3 mg Oral QHS  . metoprolol tartrate  50 mg Oral BID  . multivitamin with minerals  1 tablet Oral Daily  . nystatin  5 mL Oral QID  . pantoprazole  40 mg Oral Daily  . simethicone  80 mg Oral QID   Continuous Infusions: . magnesium sulfate bolus IVPB    . TPN ADULT (ION) 40 mL/hr at 05/29/19 1741  . TPN ADULT (ION)    . vancomycin 1,000 mg (05/29/19 2354)   PRN Meds:.acetaminophen **OR** acetaminophen, ondansetron **OR** ondansetron (ZOFRAN) IV, oxyCODONE, sodium chloride  flush  Assessment/Plan: s/p Procedure(s): TRANSESOPHAGEAL ECHOCARDIOGRAM (TEE) 05/27/2019  Perforated prepyloric ulcer S/pEXPLORATORY LAPAROTOMY WITH OVERSEW OF DUODENAL ULCER with [name] patch11/23/2020 Dr. Lucia Gaskins [facility] Fistula -JP surgical drainfell out3/14/21and 1 IR drain placed 12/16/20in place with enteric contents  - UGI3/12showed2 separate enterocutaneous fistulae - may need drain injection studies to further characterize fistulas in the future, as well as EGD - on mechanical soft diet per SP, started calorie count 3/15 - continue strict I&O's of output from ECF and drain - TPN restarted 3/18 via R PICC - patient anxious to go home  ID - vancomycin 3/11>> VTE -SCDs, lovenox Foley -in place Follow up -TBD   LOS:  8 days   Mickeal Skinner, MD [facility]

## 2019-05-30 NOTE — Progress Notes (Signed)
PHARMACY - TOTAL PARENTERAL NUTRITION CONSULT NOTE   Indication: Fistula  Patient Measurements: Height: 5\' 2"  (157.5 cm) Weight: 132 lb 4.4 oz (60 kg) IBW/kg (Calculated) : 50.1 TPN AdjBW (KG): 54.6 Body mass index is 24.19 kg/m.  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  Significant history of DM with bilateral BKA.  Presenting from SNF with MRSA bacteremia, suspected source from PICC.  Has been on oral diet this admission with line holiday however decreased appetite and may not be absorbing much per RD, unable to meet requirements.  Moderate malnutrition of chronic disease per RD assessment.     Glucose / Insulin: History of DM and requirement of insulin in TPN. Previously this admission on D10 to maintain euglycemia before TPN start. Current CBGs 116-171 up with re-initiation of TPN, 1 unit insulin in last 24 hours. Will increase to Moderate Novolog sliding scale and watch CBGs with increase in TPN.   Electrolytes: Na low at 133, CO2 low at 19. Mag 1.7 and CoCa 10.3, Others within normal limits.  Renal: SCr 0.82 - stable. BUN down to 18.  LFTs / TGs: LFTs/Tbili within normal limits. TG 181.  Prealbumin / albumin: Prealbumin 7/ Albumin 1.5 Intake / Output; MIVF: UOP 0.9 mL/kg/hr. Colostomy output 855 mL. JP drain 20 mL. +953mL yesterday. Net -130 mL for admission. No generalized edema noted.  GI Imaging: none since start of TPN Surgeries / Procedures: none since start of TPN  Central access: PICC replaced 05/29/19 TPN start date: 05/29/19  Nutritional Goals (per RD recommendation on 3/18): kCal: 1800-2000, Protein: 83-110g, Fluid: >= 1.9L Goal TPN rate is 75 mL/hr (provides 100.8 g of protein and 1860 kcals per day)  Current Nutrition:  Soft diet - Dysphagia 2 --intake 0-25% per meal   Plan:  Increase custom TPN to 55 mL/hr at 1800 - Increasing to goal slowly due to CBG control.  Electrolytes in TPN: 55mEq/L  of Na, 47mEq/L of K, (decr) 2.26mEq/L of Ca, (incr) 37mEq/L of Mg, and 75mmol/L of Phos. Cl:Ac ratio 1:1 Patient receiving oral MVI which contains trace - will not add to TPN.  Increase  Moderate q6h SSI and adjust as needed  Monitor TPN labs on Mon/Thurs, and daily with start of TPN.   Magnesium 1g IV x1 today.  Monitor po intake and plan for TPN long-term.   Sloan Leiter, PharmD, BCPS, BCCCP Clinical Pharmacist Please refer to [facility] for [place] numbers 05/30/2019,8:06 AM

## 2019-05-30 NOTE — Plan of Care (Signed)
  Problem: Clinical Measurements: Goal: Ability to maintain clinical measurements within normal limits will improve Outcome: Progressing   Problem: Coping: Goal: Level of anxiety will decrease Outcome: Progressing   

## 2019-05-30 NOTE — Progress Notes (Signed)
PALLIATIVE NOTE:  Patient in bed resting. Awakens on verbal stimuli. A&O x3. Denies pain or shortness of breath. States she is tired, have not slept much over the past day. Expressing wishes to go home and not return to skilled facility.   PICC was placed on 05/29/19 and TPN restarted. Discussed briefly patient's goals, which she expresses clearly to continue all treatments, confirms DNR, and wishes to return home.   Assessment -thin, frail, cachectic -diminished bilaterally -bilateral BKA -alert and oriented, follows commands, mood appropriate  Plan -Continue current plan of care per medical team -Would recommend outpatient palliative support at discharge -PMT will continue to support and follow as needed.   Time Total: 20 min.   Visit consisted of counseling and education dealing with the complex and emotionally intense issues of symptom management and palliative care in the setting of serious and potentially life-threatening illness.Greater than 50%  of this time was spent counseling and coordinating care related to the above assessment and plan.  Alda Lea, AGPCNP-BC  Palliative Medicine Team [phone]

## 2019-05-30 NOTE — Evaluation (Signed)
Physical Therapy Re-Evaluation Patient Details Name: Gina [name] MRN: [id number] DOB: October 14, 1960 Today's Date: 05/30/2019   History of Present Illness  Pt is a 59 y/o female admitted from SNF secondary to abnormal lab values. Found to have MRSA bacteremia. Pt also with abdominal fistula and multiple abdominal drains secondary to previous complicated hospital admission. Pt now s/p 1 unit PRBC 3/18. PMH includes HTN, DM, bilateral BKA, and schizophrenia.  Clinical Impression  Pt in bed upon arrival of PT/OT, agreeable to evaluation at this time. Prior to admission the pt was dependent for mobility and ADLs while living at SNF, but now is wanting to return home with family. The pt presents with limitations in functional mobility, skin integrity, and activity tolerance due to above dx and sores resulting from continued immobility, and will continue to benefit from skilled PT to address these deficits. The pt will also need multiple pieces of equipment such as a hoyer lift for continued safety of the pt and her family as well as a WC and hospital bed specifically designed for pressure relief. The pt was able to transition to sitting EOB today and maintain with good core stability and BUE support, but will continue to benefit from skilled PT to facilitate independence and decrease caregiver burden with transfers.      Follow Up Recommendations Home health PT;Supervision/Assistance - 24 hour(family and pt prefer home to SNF)    Equipment Recommendations  Other (comment);Hospital bed(pt needs power WC with tilt-in-space capabilities for pressure relief, as well as pressure relief mattress and hoyer lift for transfers)    Recommendations for Other Services       Precautions / Restrictions Precautions Precautions: Fall;Other (comment) Precaution Comments: bilateral BKA  Restrictions Weight Bearing Restrictions: No      Mobility  Bed Mobility Overal bed mobility: Needs Assistance Bed Mobility:  Rolling;Sidelying to Sit;Sit to Supine;Supine to Sit Rolling: Mod assist Sidelying to sit: Max assist;+2 for physical assistance;HOB elevated Supine to sit: Total assist;+2 for physical assistance;HOB elevated(physically easier, but relied only on therapists to complete movement) Sit to supine: Total assist;+2 for physical assistance;HOB elevated   General bed mobility comments: rolling to L and R for pericare and hygiene, pt initiates movement, needs mod A to complete movement. Initially trialed sidelying to sit, pt requiring maxA of 2 to sit EOB, pt reported this caused sig pain and immediately laid back down. Then trialed transition with use of pads, required totalA of 2 but was easier to facilitate and pt reported no pain.  Transfers Overall transfer level: (pt unable, used hoyer at baseline)               General transfer comment: Dependent at baseline   Ambulation/Gait                Hotel manager mobility: (pt unable to transfer into w/c without hoyer)  Modified Rankin (Stroke Patients Only)       Balance Overall balance assessment: Needs assistance Sitting-balance support: Single extremity supported;Feet supported Sitting balance-Leahy Scale: Fair Sitting balance - Comments: pt able to maintain balance without support with BUE support, minG with single UE support     Standing balance-Leahy Scale: Zero Standing balance comment: pt unable                             Pertinent Vitals/Pain Pain Assessment: Faces Faces Pain  Scale: Hurts even more Pain Location: buttocks with pressure (when rolling or sitting) Pain Descriptors / Indicators: Grimacing;Guarding;Crying;Discomfort;Sore Pain Intervention(s): Limited activity within patient's tolerance;Monitored during session;Repositioned    Home Living Family/patient expects to be discharged to:: Private residence Living Arrangements:  Children;Other relatives Available Help at Discharge: Family;Available 24 hours/day   Home Access: Level entry     Home Layout: One level Home Equipment: Walker - 2 wheels;Bedside commode;Shower seat Additional Comments: pt from SNF, now pt and family wanting pt to return home with family. Per chart, family will be with pt and providing assist as needed.    Prior Function Level of Independence: Needs assistance   Gait / Transfers Assistance Needed: Requires hoyer lift for transfers.   ADL's / Homemaking Assistance Needed: Requires assist with bathing/dressing at SNF   Comments: pt will now be returning with family to home vs returning to SNF     Hand Dominance   Dominant Hand: Right    Extremity/Trunk Assessment   Upper Extremity Assessment Upper Extremity Assessment: Defer to OT evaluation RUE Deficits / Details: pt reports painful RUE, decreased ROM    Lower Extremity Assessment Lower Extremity Assessment: Generalized weakness;RLE deficits/detail;LLE deficits/detail RLE Deficits / Details: BKA at baseline  LLE Deficits / Details: BKA at baseline. Increased weakness noted.        Communication   Communication: No difficulties  Cognition Arousal/Alertness: Awake/alert Behavior During Therapy: WFL for tasks assessed/performed Overall Cognitive Status: No family/caregiver present to determine baseline cognitive functioning                                 General Comments: pt initially voicing some furstration or hesitation to get up with PT, cooperative and agreeable to mobiltiy when educated on my role in her return to live at home and my goal of making the transition easier for her and her family.      General Comments      Exercises     Assessment/Plan    PT Assessment Patient needs continued PT services  PT Problem List Decreased strength;Decreased balance;Decreased mobility;Decreased activity tolerance;Decreased skin integrity       PT  Treatment Interventions Therapeutic exercise;Balance training;Wheelchair mobility training;Functional mobility training;Therapeutic activities;Patient/family education    PT Goals (Current goals can be found in the Care Plan section)  Acute Rehab PT Goals Patient Stated Goal: wants to go home PT Goal Formulation: With patient Time For Goal Achievement: 05/23/19 Potential to Achieve Goals: Fair    Frequency Min 2X/week   Barriers to discharge   pt family needs equipment to meet pt needs    Co-evaluation PT/OT/SLP Co-Evaluation/Treatment: Yes Reason for Co-Treatment: Complexity of the patient's impairments (multi-system involvement);For patient/therapist safety;To address functional/ADL transfers           AM-PAC PT "6 Clicks" Mobility  Outcome Measure Help needed turning from your back to your side while in a flat bed without using bedrails?: A Lot Help needed moving from lying on your back to sitting on the side of a flat bed without using bedrails?: A Lot Help needed moving to and from a bed to a chair (including a wheelchair)?: Total Help needed standing up from a chair using your arms (e.g., wheelchair or bedside chair)?: Total Help needed to walk in hospital room?: Total Help needed climbing 3-5 steps with a railing? : Total 6 Click Score: 8    End of Session   Activity Tolerance: Patient  tolerated treatment well Patient left: in bed;with bed alarm set;with call bell/phone within reach Nurse Communication: Mobility status PT Visit Diagnosis: Difficulty in walking, not elsewhere classified (R26.2);Pain Pain - Right/Left: (bilateral) Pain - part of body: (buttocks)    Time: 3414-4360 PT Time Calculation (min) (ACUTE ONLY): 39 min   Charges:   PT Evaluation $PT Re-evaluation: 1 Re-eval PT Treatments $Therapeutic Activity: 8-22 mins        Karma Ganja, PT, DPT   Acute Rehabilitation Department Pager #: [phone]   Otho Bellows 05/30/2019, 1:04  PM

## 2019-05-31 DIAGNOSIS — L89210 Pressure ulcer of right hip, unstageable: Secondary | ICD-10-CM

## 2019-05-31 DIAGNOSIS — E43 Unspecified severe protein-calorie malnutrition: Secondary | ICD-10-CM

## 2019-05-31 DIAGNOSIS — R188 Other ascites: Secondary | ICD-10-CM

## 2019-05-31 DIAGNOSIS — I70261 Atherosclerosis of native arteries of extremities with gangrene, right leg: Secondary | ICD-10-CM

## 2019-05-31 DIAGNOSIS — I10 Essential (primary) hypertension: Secondary | ICD-10-CM

## 2019-05-31 LAB — BASIC METABOLIC PANEL
Anion gap: 5 (ref 5–15)
BUN: 28 mg/dL — ABNORMAL HIGH (ref 6–20)
CO2: 20 mmol/L — ABNORMAL LOW (ref 22–32)
Calcium: 8.2 mg/dL — ABNORMAL LOW (ref 8.9–10.3)
Chloride: 106 mmol/L (ref 98–111)
Creatinine, Ser: 0.83 mg/dL (ref 0.44–1.00)
GFR calc Af Amer: >60 mL/min (ref >60)
GFR calc non Af Amer: >60 mL/min (ref >60)
Glucose, Bld: 178 mg/dL — ABNORMAL HIGH (ref 70–99)
Potassium: 4.8 mmol/L (ref 3.5–5.1)
Sodium: 131 mmol/L — ABNORMAL LOW (ref 135–145)

## 2019-05-31 LAB — VANCOMYCIN, RANDOM
Vancomycin Rm: 30
Vancomycin Rm: 22

## 2019-05-31 LAB — CBC WITH DIFFERENTIAL/PLATELET
Abs Immature Granulocytes: 0.06 10*3/uL (ref 0.00–0.07)
Basophils Absolute: 0 10*3/uL (ref 0.0–0.1)
Basophils Relative: 0 %
Eosinophils Absolute: 0.1 10*3/uL (ref 0.0–0.5)
Eosinophils Relative: 1 %
HCT: 30.6 % — ABNORMAL LOW (ref 36.0–46.0)
Hemoglobin: 9.9 g/dL — ABNORMAL LOW (ref 12.0–15.0)
Immature Granulocytes: 1 %
Lymphocytes Relative: 22 %
Lymphs Abs: 2.1 10*3/uL (ref 0.7–4.0)
MCH: 29.6 pg (ref 26.0–34.0)
MCHC: 32.4 g/dL (ref 30.0–36.0)
MCV: 91.3 fL (ref 80.0–100.0)
Monocytes Absolute: 1.2 10*3/uL — ABNORMAL HIGH (ref 0.1–1.0)
Monocytes Relative: 12 %
Neutro Abs: 6.1 10*3/uL (ref 1.7–7.7)
Neutrophils Relative %: 64 %
Platelets: 367 10*3/uL (ref 150–400)
RBC: 3.35 MIL/uL — ABNORMAL LOW (ref 3.87–5.11)
RDW: 16.2 % — ABNORMAL HIGH (ref 11.5–15.5)
WBC: 9.6 10*3/uL (ref 4.0–10.5)
nRBC: 0 % (ref 0.0–0.2)

## 2019-05-31 LAB — PHOSPHORUS: Phosphorus: 3.3 mg/dL (ref 2.5–4.6)

## 2019-05-31 LAB — GLUCOSE, CAPILLARY
Glucose-Capillary: 121 mg/dL — ABNORMAL HIGH (ref 70–99)
Glucose-Capillary: 123 mg/dL — ABNORMAL HIGH (ref 70–99)
Glucose-Capillary: 141 mg/dL — ABNORMAL HIGH (ref 70–99)
Glucose-Capillary: 143 mg/dL — ABNORMAL HIGH (ref 70–99)
Glucose-Capillary: 145 mg/dL — ABNORMAL HIGH (ref 70–99)
Glucose-Capillary: 159 mg/dL — ABNORMAL HIGH (ref 70–99)
Glucose-Capillary: 176 mg/dL — ABNORMAL HIGH (ref 70–99)

## 2019-05-31 LAB — MAGNESIUM: Magnesium: 1.9 mg/dL (ref 1.7–2.4)

## 2019-05-31 MED ORDER — VANCOMYCIN HCL IN DEXTROSE 1-5 GM/200ML-% IV SOLN
1000.0000 mg | INTRAVENOUS | Status: DC
  Filled 2019-05-31: qty 200

## 2019-05-31 MED ORDER — SODIUM CHLORIDE 0.9 % IV BOLUS
250.0000 mL | Freq: Once | INTRAVENOUS | Status: AC
  Administered 2019-05-31: 250 mL via INTRAVENOUS

## 2019-05-31 MED ORDER — VANCOMYCIN VARIABLE DOSE PER UNSTABLE RENAL FUNCTION (PHARMACIST DOSING): Status: DC

## 2019-05-31 MED ORDER — TRAVASOL 10 % IV SOLN
INTRAVENOUS | Status: AC
  Filled 2019-05-31: qty 1080

## 2019-05-31 NOTE — Progress Notes (Signed)
[place],NP texted to inform of temp of 100.9 orally. Awaiting response.

## 2019-05-31 NOTE — Progress Notes (Addendum)
Pharmacy Antibiotic Note  Gina [name] is a 59 y.o. female called back in for admission on 05/22/2019 with MRSA bacteremia. Pharmacy has been consulted for Vancomycin dosing.  vanc level 30 @ 10:30 vanc level 22 @ 22:03 Est t1/2 25.67 hr, ke 0.027 q36 hours dosing interval best option but not ideal for outpatient dosing  Plan: - Restart vancomycin 1250 mg IV q48 hours - Estimated AUC 464 - Stop date 3/25, OPAT updated  Height: 5\' 2"  (157.5 cm) Weight: 134 lb 7.7 oz (61 kg) IBW/kg (Calculated) : 50.1  Temp (24hrs), Avg:99.6 F (37.6 C), Min:98 F (36.7 C), Max:100.9 F (38.3 C)  Recent Labs  Lab 05/27/19 0429 05/28/19 0152 05/29/19 1003 05/30/19 0407 05/30/19 0903 05/31/19 1020 05/31/19 1030 05/31/19 2203  WBC 7.7 11.5* 8.2  --  12.1* 9.6  --   --   CREATININE 0.71 0.84 0.97 0.82  --  0.83  --   --   VANCORANDOM  --   --   --   --   --   --  30 22    Estimated Creatinine Clearance: 63.6 mL/min (by C-G formula based on SCr of 0.83 mg/dL).    Allergies  Allergen Reactions  . Bee Venom Swelling    Swells up with bee stings   . Metformin And Related Other (See Comments)    Upset stomach   Thank you for allowing pharmacy to be a part of this patient's care.  Alycia Rossetti, PharmD, BCPS Clinical Pharmacist Clinical phone for 06/01/2019: [phone] 06/01/2019 8:18 AM   **Pharmacist phone directory can now be found on [email or web address] ([email or web address]).  Listed under [place].

## 2019-05-31 NOTE — Progress Notes (Signed)
PHARMACY - TOTAL PARENTERAL NUTRITION CONSULT NOTE   Indication: Fistula  Patient Measurements: Height: 5\' 2"  (157.5 cm) Weight: 134 lb 7.7 oz (61 kg) IBW/kg (Calculated) : 50.1 TPN AdjBW (KG): 54.6 Body mass index is 24.6 kg/m.  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  Significant history of DM with bilateral BKA.  Presenting from SNF with MRSA bacteremia, suspected source from PICC.  Has been on oral diet this admission with line holiday however decreased appetite and may not be absorbing much per RD, unable to meet requirements.  Moderate malnutrition of chronic disease per RD assessment.     Glucose / Insulin: History of DM and requirement of insulin in TPN. Previously this admission on D10 to maintain euglycemia before TPN start. Current CBGs 123-182, required 10 units of moderate SSI in last 24 hours.  Electrolytes: Na low at 131, K up 4.8- will decrease some in TPN, CO2 20, Mag 1.9, Phos 3.3, and CoCa 10.2. Renal: SCr 0.83 - stable. BUN up 28.  LFTs / TGs: LFTs/Tbili within normal limits. TG 181.  Prealbumin / albumin: Prealbumin 7/ Albumin 1.5 Intake / Output; MIVF: UOP 0.9 mL/kg/hr. Colostomy output 855 mL. JP drain 20 mL. +986mL yesterday. Net -130 mL for admission. No generalized edema noted.  GI Imaging: none since start of TPN Surgeries / Procedures: none since start of TPN  Central access: PICC replaced 05/29/19 TPN start date: 05/29/19  Nutritional Goals (per RD recommendation on 3/18): kCal: 1800-2000, Protein: 83-110g, Fluid: >= 1.9L Goal TPN rate is 75 mL/hr (provides 108 g of protein and 1819 kcals per day)  Current Nutrition:  Soft diet - Dysphagia 2 --intake 0% per meal yesterday and no supplements   Plan:  Increase custom TPN to goal rate of 75 mL/hr at 1800. Electrolytes in TPN: 10mEq/L of Na, (decr) 30 mEq/L of K (54 mEq/day), 2.68mEq/L of Ca,  10mEq/L of Mg, and 68mmol/L of Phos. Cl:Ac  ratio 1:2 Patient receiving oral MVI which contains trace - will not add to TPN.  Continue  Moderate q6h SSI and adjust as needed  Monitor TPN labs on Mon/Thurs, and daily with start of TPN.   Monitor po intake and plan for TPN long-term.   Sloan Leiter, PharmD, BCPS, BCCCP Clinical Pharmacist Please refer to [facility] for [place] numbers 05/31/2019,7:49 AM
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
## 2019-05-31 NOTE — Progress Notes (Signed)
PROGRESS NOTE    Gina [name]  YPP:[id number] DOB: 1960/03/31 DOA: 05/22/2019 PCP: [name], MD   Brief Narrative:  59 year old female with DM, HTN, schizophrenia, bilateral BKA sent from [facility] due to positive blood culture.  Recent admission November 2020 for perforated prepyloric ulcer with septic shock with acute peritonitis status post exlap with [name] patch on 11/23, and was discharged to [facility] on Unasyn Bactrim and antifungal for total 6 weeks.  Patient was discharged with Foley catheter, PICC line, bilateral lower quadrant drains along with enterocutaneous fistula with colostomy bag . She was discharged from [facility] to [facility] whiere she has been living for last 3 months. Patient  was sent to ER for vague sleepiness malaise anorexia low-grade fever and leukocytosis at SNF, and in the ER exam labs unremarkable and patient was sent back to SNF but overnight blood culture returned MRSA 2/2 and she was readmitted to the hospital.  Patient is being followed by infectious disease, general surgery.She did well with speech, diet was restarted, PICC line was removed as no longer needing TPN and also needing line holidays given her MRSA bacteremia.  Foley catheter was exchanged.  Repeat blood culture negative from 3/2.  ID has advised to continue vancomycin through 3/25 and ordered PICC line 3/17. Surgery has been following patient and on calorie count, her intestinal obstruction is not reliable so if it is not ideal and if not able to meet calorie count will likely need to go back on TPN.  Patient and family planning to return home with home health services upon discharge and has declined skilled nursing facility placement.   Patient has a PICC placed 3/18-started back on TPN.   Assessment & Plan:   Principal Problem:   MRSA bacteremia Active Problems:   HTN (hypertension)   Diabetes ([place])   Severe protein-calorie malnutrition ([place])   Anemia of chronic disease   Type 2  diabetes mellitus with hyperosmolar nonketotic hyperglycemia (HCC)   Pressure injury of right thigh, unstageable (HCC)   Below-knee amputee ([place])   Atherosclerosis of native arteries of extremities with gangrene, right leg (HCC)   Intra-abdominal fluid collection   Palliative care by specialist   Goals of care, counseling/discussion   DNR (do not resuscitate)   Physical deconditioning   Dysphagia   Malnutrition of moderate degree   MRSA bacteremia: indwelling PICC line used for TPN is felt likely source and PICC is out. Appreciate ID on board. For TEE 3/16. Repeat blood cultures 3/12- Negative so far.TTE negative for endocarditis.  Appreciate ID input, status post PICC placed 3/18 and continue vancomycin through 3/25.   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  Continue on increased  Imodium to decrease for stool output-discusssed with surg sat, no changes form their perspective  Moderate protein-calorie malnutrition: was TPN dependent, dietitian on consult.  Now on oral diet and calorie count-surgery planning to address with TPN if unable to meet her requirement, as her intestinal obstruction is questionable and not ideal candidate for tube feeding.  Per surgery patient is back on TPN, who is the clinical pharmacist today monitoring for next 24 hours to see patient is to have insulin placed on her TPN bag as blood sugar trending up and has required insulin previously, cont to monitor  Bilateral stump wounds continue wound care and  dressing.  Recent admission November 2020 for perforated prepyloric ulcer with septic shock with acute peritonitis status post exlap with [name] patch on 11/23-patient undergoing bilateral lower quadrant drain, enterocutaneous  fistula with colostomy bag.  Upper GI shows fistulous connection at 2 points in the small bowel (proximal small bowel, suspect 1st site of communication is proximal jejunum) connecting to the known collection in the anterior abdomen and io the skin.  May need drain injection to further characterize fistula in the future as well as EGD per surgery and will need to follow-up as outpatient  HTN:BP well controlled, cont amlodipine.  Metabolic acidosis/elevated BUN/dehydration: Dehydration likely in the setting of EC fistula output.  Tolerating diet-gentle IV fluid hydration. BUN 28. She is off ivf.   Diabetes mellitus: hbA1c 5.9 3/12. She had hypoglycemia-blood sugar 67  (3/12)- stopped lantus and on ssi.  Now blood sugar trending up as we will start her on TPN.  May need to be back on insulin so CONT to monitor.  Anemia of chronic disease: Previously hemoglobin has been ranging from 9 to 8 g, and drifting downward.  6.7 g today patient appears weak and symptomatic.  Discussed next benefits and agreed for transfusion s/p 2 unit PRBC, h/h up in 10 gm. Anemia panel ferritin 646, iron low at 22, B12 750, folate 22. Hemoccult was negative. No Signs of active bleeding.  Monitor closely. 9.9 on 3/20  Below-knee amputee/Atherosclerosis of native arteries: Stump evaluation by wound care for her ilateral stump wounds   Transaminitis: Suspect from TPN.  Monitor closely.  Asymptomatic bacteriuria: replaceD Foley after admission.  GERD: on PPI  Mood disorder:on Celexa  Goals of care: Palliative care consulted patient is DNR MOST form filled.Per recommendation to treat what is treatable and plan on palliative care referral upon discharge.Given significant comorbidities malnutrition patient is at high risk for readmission and decompensation.  Interventions: Refer to RD note for recommendations Body mass index is 24.19 kg/m.  Pressure Ulcer: Pressure Injury 05/22/19 Coccyx Bilateral Stage 2 -   Partial thickness loss of dermis presenting as a shallow open injury with a red, pink wound bed without slough. (Active)  05/22/19 2104  Location: Coccyx  Location Orientation: Bilateral  Staging: Stage 2 -  Partial thickness loss of dermis presenting as a shallow open injury with a red, pink wound bed without slough.  Wound Description (Comments):   Present on Admission: Yes     Pressure Injury 05/22/19 Knee Right;Distal Unstageable - Full thickness tissue loss in which the base of the injury is covered by slough (yellow, tan, gray, green or brown) and/or eschar (tan, brown or black) in the wound bed. Stump (Active)  05/22/19 2106  Location: Knee  Location Orientation: Right;Distal  Staging: Unstageable - Full thickness tissue loss in which the base of the injury is covered by slough (yellow, tan, gray, green or brown) and/or eschar (tan, brown or black) in the wound bed.  Wound Description (Comments): Stump  Present on Admission: Yes     Pressure Injury 05/22/19 Knee Left;Distal Unstageable - Full thickness tissue loss in which the base of the injury is covered by slough (yellow, tan, gray, green or brown) and/or eschar (tan, brown or black) in the wound bed. Stump (Active)  05/22/19 2108  Location: Knee  Location Orientation: Left;Distal  Staging: Unstageable - Full thickness tissue loss in which the base of the injury is covered by slough (yellow, tan, gray, green or brown) and/or eschar (tan, brown or black) in the wound  bed.  Wound Description (Comments): Stump  Present on Admission: Yes     Pressure Injury 05/22/19 Thigh Left;Mid Stage 2 -  Partial thickness loss of dermis presenting as a shallow open injury with a red, pink wound bed without slough. (Active)  05/22/19 2109  Location: Thigh  Location Orientation: Left;Mid  Staging: Stage 2 -  Partial thickness loss of dermis presenting as a shallow open injury with a red, pink wound bed without slough.  Wound Description  (Comments):   Present on Admission: Yes     DVT prophylaxis: Lovenox SQ  Code Status: DNR    Code Status Orders  (From admission, onward)         Start     Ordered   05/23/19 0752  Do not attempt resuscitation (DNR)  Continuous    Question Answer Comment  In the event of cardiac or respiratory ARREST Do not call a "code blue"   In the event of cardiac or respiratory ARREST Do not perform Intubation, CPR, defibrillation or ACLS   In the event of cardiac or respiratory ARREST Use medication by any route, position, wound care, and other measures to relive pain and suffering. May use oxygen, suction and manual treatment of airway obstruction as needed for comfort.      05/23/19 0751        Code Status History    Date Active Date Inactive Code Status Order ID Comments User Context   05/22/2019 1624 05/23/2019 0751 Full Code [id number]  Edwin Dada, MD ED   05/21/2019 1613 05/21/2019 1702 DNR [id number]  [name], MD ED   03/10/2019 1527 04/23/2019 0252 DNR [id number]  Janyth Pupa Inpatient   03/05/2019 0758 03/10/2019 1449 DNR [id number]  Jimmy Footman, NP Inpatient   02/03/2019 0251 03/05/2019 0758 Full Code [id number]  Bennie Pierini, MD Inpatient   12/26/2018 0553 12/30/2018 1846 Full Code [id number]  [name], MD ED   11/25/2018 0304 11/28/2018 1950 Full Code [id number]  [name], MD ED   05/09/2018 0146 05/21/2018 1805 Full Code [id number]  [name], MD Inpatient   03/13/2018 2234 03/14/2018 1625 Full Code [id number]  [name], PA-C ED   11/06/2017 1209 11/16/2017 1656 Full Code [id number]  [name], MD ED   02/22/2017 2342 02/26/2017 2155 Full Code [id number]  [name], MD ED   02/20/2017 2300 02/21/2017 2220 Full Code [id number]  Ward, [name], PA-C ED   06/01/2016 1944 06/07/2016 2220 Full Code [id number]  [name], MD Inpatient   06/18/2013 1157 06/21/2013 1240 Full Code [id number]  [name],  MD ED   07/31/2012 0518 08/03/2012 1526 Full Code [id number]  Theressa Millard, MD ED   Advance Care Planning Activity    Advance Directive Documentation     Most Recent Value  Type of Advance Directive  Out of facility DNR (pink MOST or yellow form)  Pre-existing out of facility DNR order (yellow form or pink MOST form)  --  "MOST" Form in Place?  --     Family Communication: Discussed with patient at bedside Disposition Plan:   Patient is from: SNF Anticipated Disposition: to home with home and services.  Patient reports she is not going back to skilled nursing facility.   Barriers to discharge or conditions that needs to be met prior to discharge: Admitted for MRSA bacteremia treatment, and protein calorie malnutrition-off TPN and a PICC line due to bacteremia and now back on new PICC line with  TPN as oral intake not reliable-patient has been recommended for a skilled nursing facility but she has declined and wants to return home with daughter, difficult disposition, CM following for feasibility pf home health services given her needs:TPN/IV vancomycin.Monitoring for insulin requirement for additional 24 hours while on TPN. Consults called: None Admission status: Inpatient   Consultants:   Infectious disease, general surgery  Procedures:  CT ABDOMEN PELVIS W CONTRAST  Result Date: 05/21/2019 CLINICAL DATA:  Acute generalized abdominal pain. EXAM: CT ABDOMEN AND PELVIS WITH CONTRAST TECHNIQUE: Multidetector CT imaging of the abdomen and pelvis was performed using the standard protocol following bolus administration of intravenous contrast. CONTRAST:  168m OMNIPAQUE IOHEXOL 300 MG/ML  SOLN COMPARISON:  April 11, 2019. FINDINGS: Lower chest: Mild right pleural effusion is noted with associated atelectasis of the right lower lobe. Mild left basilar subsegmental atelectasis is noted. Hepatobiliary: Solitary gallstone is noted. No biliary dilatation is noted. The liver is unremarkable.  Pancreas: Unremarkable. No pancreatic ductal dilatation or surrounding inflammatory changes. Spleen: Normal in size without focal abnormality. Adrenals/Urinary Tract: Adrenal glands and kidneys appear normal. No hydronephrosis or renal obstruction is noted. Urinary bladder is decompressed secondary to Foley catheter. Stomach/Bowel: The stomach is unremarkable. There is no evidence of bowel obstruction or inflammation. The appendix is unremarkable. Vascular/Lymphatic: No significant vascular findings are present. No enlarged abdominal or pelvic lymph nodes. Reproductive: Uterus and bilateral adnexa are unremarkable. Other: Surgical drain is seen passing underneath the liver with distal tip inferior to the left hepatic lobe. Continued presence of pigtail catheter within complex fluid collection anteriorly in the abdomen concerning for abscess. It is significantly smaller compared to prior exam, currently measuring 7.9 x 1.5 cm. Musculoskeletal: No acute or significant osseous findings. IMPRESSION: 1. Mild right pleural effusion is noted with associated atelectasis of the right lower lobe. 2. Solitary gallstone is noted. 3. Surgical drain is seen passing underneath the liver with distal tip inferior to the left hepatic lobe. Continued presence of complex fluid collection anteriorly in the abdomen concerning for abscess. It is significantly smaller compared to prior exam, currently measuring 7.9 x 1.5 cm. Pigtail drainage catheter is unchanged in position. Electronically Signed   By: JMarijo ConceptionM.D.   On: 05/21/2019 13:36   DG Chest Port 1 View  Result Date: 05/21/2019 CLINICAL DATA:  Weakness, altered mental status EXAM: PORTABLE CHEST 1 VIEW COMPARISON:  03/26/2019 FINDINGS: Left PICC line in place with the tip in the SVC. Low lung volumes. Bibasilar airspace opacities, right greater than left. Small right pleural effusion. Heart is mildly enlarged. No acute bony abnormality. IMPRESSION: Bibasilar atelectasis  or infiltrates, right greater than left. Small right pleural effusion. Electronically Signed   By: KRolm BaptiseM.D.   On: 05/21/2019 10:45   DG Swallowing Func-Speech Pathology  Result Date: 05/24/2019 Objective Swallowing Evaluation: Type of Study: MBS-Modified Barium Swallow Study  Patient Details Name: FSiana PanamenoMRN: [id number] of Birth: 2June 27, 1962Today's Date: 05/24/2019 Time: SLP Start Time (ACUTE ONLY): 1035 -SLP Stop Time (ACUTE ONLY): 1043 SLP Time Calculation (min) (ACUTE ONLY): 8 min Past Medical History: Past Medical History: Diagnosis Date . Diabetes mellitus  . Hypertension  . Osteomyelitis of right foot ([place] 02/22/2017 . Perforated gastric ulcer ([place]  . S/P bilateral BKA (below knee amputation) ([place]  . Schizophrenia ([facility]  . Schizophrenia ([place]  . Septic arthritis of interphalangeal joint of toe of left foot ([place] 06/02/2016 . Stress incontinence 04/26/2017 Past Surgical History: Past Surgical History: Procedure Laterality  Date . AMPUTATION Left 06/05/2016  Procedure: LEFT FOOT TRANSMETATARSAL AMPUTATION;  Surgeon: [name], MD;  Location: [facility];  Service: Orthopedics;  Laterality: Left; . AMPUTATION Right 11/11/2017  Procedure: AMPUTATION BELOW KNEE;  Surgeon: [name], MD;  Location: [place];  Service: Orthopedics;  Laterality: Right; . AMPUTATION Left 05/10/2018  Procedure: LEFT BELOW KNEE AMPUTATION;  Surgeon: [name], MD;  Location: [place];  Service: Orthopedics;  Laterality: Left; . AMPUTATION Left 12/28/2018  Procedure: AMPUTATION THIRD DIGIT;  Surgeon: Iran Planas, MD;  [facility];  Service: Orthopedics;  Laterality: Left; . AMPUTATION TOE Right 02/23/2017  Procedure: AMPUTATION RIGHT THIRD TOE;  Surgeon: [name], MD;  Location: [place];  Service: Orthopedics;  Laterality: Right; . I & D EXTREMITY Right 11/09/2017  Procedure: Debride Ulcer Right Heel;  Surgeon: [name], MD;  Location: [place];  Service: Orthopedics;  Laterality: Right; . I & D  EXTREMITY Left 12/28/2018  Procedure: IRRIGATION AND DEBRIDEMENT EXTREMITY;  Surgeon: Iran Planas, MD;  [facility];  Service: Orthopedics;  Laterality: Left; . INCISION AND DRAINAGE OF WOUND Left 12/26/2018  Procedure: IRRIGATION AND DEBRIDEMENT LEFT HAND;  Surgeon: Iran Planas, MD;  [facility];  Service: Orthopedics;  Laterality: Left; . LAPAROTOMY N/A 02/02/2019  Procedure: EXPLORATORY LAPAROTOMY WITH OVERSEW OF DUODENAL ULCER;  Surgeon: Alphonsa Overall, MD;  [facility];  Service: General;  Laterality: N/A; . TUBAL LIGATION   HPI: Dezirae Service is a 59 y.o. F with hx DM, HTN, bilateral BKA, schizophrenia, and recent perforated ulcer leading to septic shock, DKA, fungemia, and persistent peritoneal abscess who presents with malaise, found to have MRSA bacteremia. Patient admitted in Nov 2020 for septic shock, found to have acute peritonitis. [facility], and anidulafungin for total 6 weeks. Based on chart review, patient was seen for dysphagia and participated in [facility] 03/2019 while at [facility]. However, full notes not available. She was then discharged from [facility] to [facility], where she has been for last three months.  Pt now admitted with MRSA. CXR 3/10: Bibasilar atelectasis or infiltrates, right greater than left. Small right pleural effusion.  No data recorded Assessment / Plan / Recommendation CHL IP CLINICAL IMPRESSIONS 05/24/2019 Clinical Impression Patient presensts with a largely normal, age appropriate, oropharyngeal swallow. Oral phase timely with swift oral transit of bolus. Swallow triggered mostly at the vallecula with one episode of frank penetration of thin liquids which remained under the epiglottis post swallow however cleared spontaneously with subsequent swallows before reaching the level of the vocal cords. No additional episodes of penetration or aspiration despite being challenged with multiple consecutive straw sips  of liquid. Note that patient unable to orally  transit pill with thin liquids, able with pureed solids, however no trouble reported at bedside with pills by patient or RN. Recommend initiation of regular texture solids, thin liquid (per MD, "diet as tolerated"). SLP will f/u briefly for tolerance.  SLP Visit Diagnosis Dysphagia, unspecified (R13.10) Attention and concentration deficit following -- Frontal lobe and executive function deficit following -- Impact on safety and function No limitations   CHL IP TREATMENT RECOMMENDATION 05/24/2019 Treatment Recommendations Therapy as outlined in treatment plan below   Prognosis 05/23/2019 Prognosis for Safe Diet Advancement Fair Barriers to Reach Goals Severity of deficits;Time post onset Barriers/Prognosis Comment -- CHL IP DIET RECOMMENDATION 05/24/2019 SLP Diet Recommendations Regular solids;Thin liquid Liquid Administration via Cup;Straw Medication Administration Whole meds with liquid Compensations  Slow rate;Small sips/bites Postural Changes Seated upright at 90 degrees   CHL IP OTHER RECOMMENDATIONS 05/24/2019 Recommended Consults -- Oral Care Recommendations Oral care BID Other Recommendations --   CHL IP FOLLOW UP RECOMMENDATIONS 05/24/2019 Follow up Recommendations None   CHL IP FREQUENCY AND DURATION 05/24/2019 Speech Therapy Frequency (ACUTE ONLY) min 1 x/week Treatment Duration 1 week      CHL IP ORAL PHASE 05/24/2019 Oral Phase WFL Oral - Pudding Teaspoon -- Oral - Pudding Cup -- Oral - Honey Teaspoon -- Oral - Honey Cup -- Oral - Nectar Teaspoon -- Oral - Nectar Cup -- Oral - Nectar Straw -- Oral - Thin Teaspoon -- Oral - Thin Cup -- Oral - Thin Straw -- Oral - Puree -- Oral - Mech Soft -- Oral - Regular -- Oral - Multi-Consistency -- Oral - Pill -- Oral Phase - Comment --  CHL IP PHARYNGEAL PHASE 05/24/2019 Pharyngeal Phase WFL Pharyngeal- Pudding Teaspoon -- Pharyngeal -- Pharyngeal- Pudding Cup -- Pharyngeal -- Pharyngeal- Honey Teaspoon -- Pharyngeal --  Pharyngeal- Honey Cup -- Pharyngeal -- Pharyngeal- Nectar Teaspoon -- Pharyngeal -- Pharyngeal- Nectar Cup -- Pharyngeal -- Pharyngeal- Nectar Straw -- Pharyngeal -- Pharyngeal- Thin Teaspoon -- Pharyngeal -- Pharyngeal- Thin Cup -- Pharyngeal -- Pharyngeal- Thin Straw -- Pharyngeal -- Pharyngeal- Puree -- Pharyngeal -- Pharyngeal- Mechanical Soft -- Pharyngeal -- Pharyngeal- Regular -- Pharyngeal -- Pharyngeal- Multi-consistency -- Pharyngeal -- Pharyngeal- Pill -- Pharyngeal -- Pharyngeal Comment --  CHL IP CERVICAL ESOPHAGEAL PHASE 05/24/2019 Cervical Esophageal Phase WFL Pudding Teaspoon -- Pudding Cup -- Honey Teaspoon -- Honey Cup -- Nectar Teaspoon -- Nectar Cup -- Nectar Straw -- Thin Teaspoon -- Thin Cup -- Thin Straw -- Puree -- Mechanical Soft -- Regular -- Multi-consistency -- Pill -- Cervical Esophageal Comment -- Gabriel Rainwater MA, CCC-SLP McCoy Leah Meryl 05/24/2019, 11:11 AM              ECHOCARDIOGRAM COMPLETE  Result Date: 05/23/2019    ECHOCARDIOGRAM REPORT   Patient Name:   JOVANNI ECKHART Date of Exam: 05/23/2019 Medical Rec #:  [id number]       Height:       62.0 in Accession #:    [id number]      Weight:       120.4 lb Date of Birth:  12-09-1960        BSA:          1.541 m Patient Age:    73 years        BP:           128/61 mmHg Patient Gender: F               HR:           76 bpm. Exam Location:  Inpatient Procedure: 2D Echo Indications:    bacteremia 790.7  History:        Patient has prior history of Echocardiogram examinations, most                 recent 03/04/2019. Signs/Symptoms:Bacteremia; Risk                 Factors:Diabetes and Hypertension.  Sonographer:    Johny Chess Referring Phys: [id number] Martina Brodbeck P [place] IMPRESSIONS  1. Left ventricular ejection fraction, by estimation, is 55 to 60%. The left ventricle has normal function. The left ventricle has no regional wall motion abnormalities. Left ventricular diastolic parameters were normal.  2. Right ventricular systolic  function is normal. The right ventricular  size is normal.  3. The mitral valve is normal in structure. No evidence of mitral valve regurgitation. No evidence of mitral stenosis.  4. The aortic valve is normal in structure. Aortic valve regurgitation is not visualized. No aortic stenosis is present. FINDINGS  Left Ventricle: Left ventricular ejection fraction, by estimation, is 55 to 60%. The left ventricle has normal function. The left ventricle has no regional wall motion abnormalities. The left ventricular internal cavity size was normal in size. There is  no left ventricular hypertrophy. Left ventricular diastolic parameters were normal. Right Ventricle: The right ventricular size is normal. No increase in right ventricular wall thickness. Right ventricular systolic function is normal. Left Atrium: Left atrial size was normal in size. Right Atrium: Right atrial size was normal in size. Pericardium: There is no evidence of pericardial effusion. Mitral Valve: The mitral valve is normal in structure. No evidence of mitral valve regurgitation. No evidence of mitral valve stenosis. Tricuspid Valve: The tricuspid valve is normal in structure. Tricuspid valve regurgitation is trivial. Aortic Valve: The aortic valve is normal in structure. Aortic valve regurgitation is not visualized. No aortic stenosis is present. Pulmonic Valve: The pulmonic valve was normal in structure. Pulmonic valve regurgitation is not visualized. Aorta: The aortic root and ascending aorta are structurally normal, with no evidence of dilitation. IAS/Shunts: The atrial septum is grossly normal.  LEFT VENTRICLE PLAX 2D LVIDd:         4.30 cm  Diastology LVIDs:         3.10 cm  LV e' lateral:   8.81 cm/s LV PW:         1.00 cm  LV E/e' lateral: 12.3 LV IVS:        0.80 cm  LV e' medial:    6.20 cm/s LVOT diam:     1.70 cm  LV E/e' medial:  17.4 LV SV:         57 LV SV Index:   37 LVOT Area:     2.27 cm  RIGHT VENTRICLE RV S prime:     12.00 cm/s  TAPSE (M-mode): 1.8 cm LEFT ATRIUM             Index       RIGHT ATRIUM           Index LA diam:        2.80 cm 1.82 cm/m  RA Area:     10.80 cm LA Vol (A2C):   34.3 ml 22.26 ml/m RA Volume:   21.90 ml  14.21 ml/m LA Vol (A4C):   34.5 ml 22.39 ml/m LA Biplane Vol: 36.3 ml 23.56 ml/m  AORTIC VALVE LVOT Vmax:   139.00 cm/s LVOT Vmean:  90.400 cm/s LVOT VTI:    0.251 m  AORTA Ao Root diam: 2.60 cm MITRAL VALVE MV Area (PHT): 2.62 cm     SHUNTS MV Decel Time: 289 msec     Systemic VTI:  0.25 m MV E velocity: 108.00 cm/s  Systemic Diam: 1.70 cm MV A velocity: 102.00 cm/s MV E/A ratio:  1.06 Mertie Moores MD Electronically signed by Mertie Moores MD Signature Date/Time: 05/23/2019/11:26:04 AM    Final    ECHO TEE  Result Date: 05/27/2019    TRANSESOPHOGEAL ECHO REPORT   Patient Name:   CORYNNE SCIBILIA Date of Exam: 05/27/2019 Medical Rec #:  [id number]       Height:       62.0 in Accession #:    [id number]  Weight:       120.4 lb Date of Birth:  11-07-1960        BSA:          1.541 m Patient Age:    11 years        BP:           90/41 mmHg Patient Gender: F               HR:           62 bpm. Exam Location:  Inpatient Procedure: Transesophageal Echo, Cardiac Doppler and Color Doppler Indications:     Bacteremia  History:         Patient has prior history of Echocardiogram examinations, most                  recent 05/23/2019. Signs/Symptoms:Fever; Risk Factors:Diabetes                  and Hypertension. MRSA                  Septic shock.  Sonographer:     Vikki Ports Turrentine Referring Phys:  [id number] Abigail Butts Diagnosing Phys: Skeet Latch MD PROCEDURE: The transesophogeal probe was passed without difficulty through the esophogus of the patient. Sedation performed by different physician. The patient's vital signs; including heart rate, blood pressure, and oxygen saturation; remained stable throughout the procedure. The patient developed no complications during the procedure. IMPRESSIONS  1. Left  ventricular ejection fraction, by estimation, is 55 to 60%. The left ventricle has normal function. The left ventricle has no regional wall motion abnormalities. Left ventricular diastolic function could not be evaluated.  2. Right ventricular systolic function is normal. The right ventricular size is normal.  3. No left atrial/left atrial appendage thrombus was detected.  4. The mitral valve is normal in structure. Mild mitral valve regurgitation. No evidence of mitral stenosis.  5. The aortic valve is tricuspid. Aortic valve regurgitation is not visualized. No aortic stenosis is present. Conclusion(s)/Recommendation(s): Normal biventricular function without evidence of hemodynamically significant valvular heart disease. FINDINGS  Left Ventricle: Left ventricular ejection fraction, by estimation, is 55 to 60%. The left ventricle has normal function. The left ventricle has no regional wall motion abnormalities. The left ventricular internal cavity size was normal in size. There is  no left ventricular hypertrophy. Left ventricular diastolic function could not be evaluated. Right Ventricle: The right ventricular size is normal. No increase in right ventricular wall thickness. Right ventricular systolic function is normal. Left Atrium: Left atrial size was normal in size. No left atrial/left atrial appendage thrombus was detected. Right Atrium: Right atrial size was normal in size. Pericardium: Trivial pericardial effusion is present. Mitral Valve: The mitral valve is normal in structure. Normal mobility of the mitral valve leaflets. Mild mitral valve regurgitation. No evidence of mitral valve stenosis. Tricuspid Valve: The tricuspid valve is normal in structure. Tricuspid valve regurgitation is mild . No evidence of tricuspid stenosis. Aortic Valve: The aortic valve is tricuspid. Aortic valve regurgitation is not visualized. No aortic stenosis is present. Pulmonic Valve: The pulmonic valve was normal in structure.  Pulmonic valve regurgitation is trivial. No evidence of pulmonic stenosis. Aorta: The aortic root is normal in size and structure. IAS/Shunts: No atrial level shunt detected by color flow Doppler.  TRICUSPID VALVE TR Peak grad:   49.0 mmHg TR Vmax:        350.00 cm/s Skeet Latch MD Electronically signed by Skeet Latch  MD Signature Date/Time: 05/27/2019/5:12:39 PM    Final    [place] EKG SITE RITE  Result Date: 05/28/2019 If Site Rite image not attached, placement could not be confirmed due to current cardiac rhythm.  DG UGI W SMALL BOWEL  Result Date: 05/23/2019 CLINICAL DATA:  Bowel leak. EXAM: UPPER GI SERIES WITH KUB TECHNIQUE: After obtaining a scout radiograph a routine upper GI series was performed using water-soluble contrast only FLUOROSCOPY TIME:  Fluoroscopy Time:  4 minutes 36 seconds Radiation Exposure Index (if provided by the fluoroscopic device): 1.2 mGy Number of Acquired Spot Images: 4 COMPARISON:  CT study of May 21, 2019 and prior study from April 11, 2019 FINDINGS: Initial scout images show a surgical drain in the right upper quadrant and a pigtail drainage catheter near the midline. Study was performed only with water-soluble contrast. During the examination which was performed in the bilateral oblique and right lateral position the esophagus showed some evidence of tertiary peristalsis and a patulous appearance with moderate gastroesophageal reflux. Question of mild distal esophageal narrowing not well assessed. Stomach was grossly normal. Patient was placed in the right lateral position and contrast passed from the stomach into the small bowel. When contrast reached the level of the proximal jejunum a tract extended anteriorly towards the anterior abdominal wall and more distal small bowel loops began to fill, at least at 2 additional locations. Gas can be seen surrounding the drainage catheter. Paragraphs shortly after the tract extending from the jejunum is filled with  contrast bowel loops to the right of the midline also began to fill and bowel loops inferior to the central abdominal collection. The at least 2 sites of communication or possible based on this study and based on review of previous imaging contrast did not fill the drain, likely passing via route of least resistance into the bowel beyond. 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 Difficult to map given the nature the process in communication with proximal small bowel where the first site of communication likely involves the proximal jejunum. Follow-up CT could be performed with positive enteric contrast. Alternatively, waiting for clearance of current contrast followed by a CT after drain injection could be helpful to further map this complex process as clinically warranted. Injection of the drain followed by CT would give the added advantage of three-dimensional relationships and knowledge of what is in definitive communication with the collection. Electronically Signed   By: Zetta Bills M.D.   On: 05/23/2019 11:40     Antimicrobials:   Vancomycin till 3/25   Subjective: No acute changes overnight   Objective: Vitals:   05/30/19 1625 05/30/19 2058 05/31/19 0424 05/31/19 0941  BP: 132/71 129/63 (!) 153/69 (!) 148/58  Pulse: 78 79 81 80  Resp: 16 16 15 16   Temp: 99.6 F (37.6 C) 98.5 F (36.9 C) (!) 100.5 F (38.1 C) 98.8 F (37.1 C)  TempSrc: Oral Oral Oral Oral  SpO2: 92% 91% 94% 96%  Weight:  61 kg    Height:        Intake/Output Summary (Last 24 hours) at 05/31/2019 1419 Last data filed at 05/31/2019 1300 Gross per 24 hour  Intake 1526.14 ml  Output 1475 ml  Net 51.14 ml   Filed Weights   05/28/19 0438 05/29/19 2128 05/30/19 2058  Weight: 53 kg 60 kg 61 kg  Examination:  General exam:  Appears calm and comfortable  Respiratory system: Clear to auscultation. Respiratory effort normal. Cardiovascular system: S1 & S2 heard, RRR. No JVD, murmurs, rubs, gallops or clicks. No pedal edema. Gastrointestinal system: Abdomen is nondistended, soft and nontender. No organomegaly or masses felt. Normal bowel sounds heard. Central nervous system: Alert and oriented. No focal neurological deficits. 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     Data Reviewed: I have personally reviewed following labs and imaging studies  CBC: Recent Labs  Lab 05/27/19 0429 05/28/19 0152 05/29/19 1003 05/30/19 0903 05/31/19 1020  WBC 7.7 11.5* 8.2 12.1* 9.6  NEUTROABS  --   --  5.5  --  6.1  HGB 7.5* 7.1* 6.7* 10.2* 9.9*  HCT 23.8* 22.8* 21.6* 31.7* 30.6*  MCV 96.7 98.3 96.4 91.1 91.3  PLT 344 390 397 393 517   Basic Metabolic Panel: Recent Labs  Lab 05/27/19 0429 05/28/19 0152 05/29/19 1003 05/30/19 0407 05/31/19 1020  NA 136 134* 131* 133* 131*  K 3.8 4.1 4.3 4.4 4.8  CL 109 109 106 108 106  CO2 19* 18* 18* 19* 20*  GLUCOSE 82 91 93 152* 178*  BUN 14 18 21* 18 28*  CREATININE 0.71 0.84 0.97 0.82 0.83  CALCIUM 8.2* 8.3* 8.1* 8.3* 8.2*  MG  --   --  1.7 1.7 1.9  PHOS  --   --  4.3 3.9 3.3   GFR: Estimated Creatinine Clearance: 63.6 mL/min (by C-G formula based on SCr of 0.83 mg/dL). Liver Function Tests: Recent Labs  Lab 05/26/19 0354 05/27/19 0429 05/28/19 0152 05/29/19 1003 05/30/19 0407  AST 19 18 15  14* 12*  ALT 23 21 17 15 14   ALKPHOS 152* 135* 137* 128* 123  BILITOT 0.8 0.5 0.8 0.8 0.7  PROT 6.9 6.4* 6.7 6.5 6.6  ALBUMIN 1.6* 1.5* 1.5* 1.5* 1.5*   No results for input(s): LIPASE, AMYLASE in the last 168 hours. No results for input(s): AMMONIA in the last 168 hours. Coagulation Profile: No results for input(s): INR, PROTIME in the last 168  hours. Cardiac Enzymes: No results for input(s): CKTOTAL, CKMB, CKMBINDEX, TROPONINI in the last 168 hours. BNP (last 3 results) No results for input(s): PROBNP in the last 8760 hours. HbA1C: No results for input(s): HGBA1C in the last 72 hours. CBG: Recent Labs  Lab 05/30/19 2057 05/30/19 2348 05/31/19 0420 05/31/19 0729 05/31/19 1135  GLUCAP 133* 143* 123* 145* 159*   Lipid Profile: Recent Labs    05/29/19 1003  TRIG 181*   Thyroid Function Tests: No results for input(s): TSH, T4TOTAL, FREET4, T3FREE, THYROIDAB in the last 72 hours. Anemia Panel: No results for input(s): VITAMINB12, FOLATE, FERRITIN, TIBC, IRON, RETICCTPCT in the last 72 hours. Sepsis Labs: No results for input(s): PROCALCITON, LATICACIDVEN in the last 168 hours.  Recent Results (from the past 240 hour(s))  SARS CORONAVIRUS 2 (TAT 6-24 HRS) Nasopharyngeal Nasopharyngeal Swab     Status: None   Collection Time: 05/22/19  2:45 PM   Specimen: Nasopharyngeal Swab  Result Value Ref Range Status   SARS Coronavirus 2 NEGATIVE NEGATIVE Final    Comment: (NOTE) SARS-CoV-2 target nucleic acids are NOT DETECTED. The SARS-CoV-2 RNA is generally detectable in upper and lower respiratory specimens during the acute phase of infection. Negative results do not preclude SARS-CoV-2 infection, do not rule out co-infections with other pathogens, and should not be used  as the sole basis for treatment or other patient management decisions. Negative results must be combined with clinical observations, patient history, and epidemiological information. The expected result is Negative. Fact Sheet for Patients: SugarRoll.be Fact Sheet for Healthcare Providers: https://www.woods-mathews.com/ This test is not yet approved or cleared by the [place] FDA and  has been authorized for detection and/or diagnosis of SARS-CoV-2 by FDA under an Emergency Use Authorization (EUA). This EUA  will remain  in effect (meaning this test can be used) for the duration of the COVID-19 declaration under Section 56 4(b)(1) of the Act, 21 U.S.C. section 360bbb-3(b)(1), unless the authorization is terminated or revoked sooner. Performed at [facility], [place] [address], [place], [place] [address]   Culture, blood (routine x 2)     Status: None   Collection Time: 05/23/19  5:34 AM   Specimen: BLOOD  Result Value Ref Range Status   Specimen Description BLOOD LEFT ARM  Final   Special Requests   Final    BOTTLES DRAWN AEROBIC AND ANAEROBIC Blood Culture results may not be optimal due to an excessive volume of blood received in culture bottles   Culture   Final    NO GROWTH 5 DAYS Performed at [facility], [place] [address] [place], [place] [address]    Report Status 05/28/2019 FINAL  Final  Culture, blood (routine x 2)     Status: None   Collection Time: 05/23/19  5:42 AM   Specimen: BLOOD  Result Value Ref Range Status   Specimen Description BLOOD LEFT HAND  Final   Special Requests   Final    BOTTLES DRAWN AEROBIC AND ANAEROBIC Blood Culture adequate volume   Culture   Final    NO GROWTH 5 DAYS Performed at [facility], [place] [address] [place], [place] [address]    Report Status 05/28/2019 FINAL  Final         Radiology Studies: No results found.      Scheduled Meds: . amLODipine  5 mg Oral Daily  . chlorhexidine  15 mL Mouth Rinse BID  . Chlorhexidine Gluconate Cloth  6 each Topical Daily  . enoxaparin (LOVENOX) injection  40 mg Subcutaneous Q24H  . escitalopram  10 mg Oral Daily  . gabapentin  300 mg Oral BID  . insulin aspart  0-15 Units Subcutaneous Q6H  . loperamide  2 mg Oral Q8H  . mouth rinse  15 mL Mouth Rinse q12n4p  . Melatonin  3 mg Oral QHS  . metoprolol tartrate  50 mg Oral BID  . multivitamin with minerals  1 tablet Oral Daily  . pantoprazole  40 mg Oral Daily  . simethicone  80 mg Oral QID   Continuous Infusions: . TPN  ADULT (ION) 55 mL/hr at 05/30/19 1729  . TPN ADULT (ION)    . vancomycin 1,000 mg (05/30/19 2133)     LOS: 9 days    Time spent: 35 min    Nicolette Bang, MD [facility]  If 7PM-7AM, please contact night-coverage  05/31/2019, 2:19 PM

## 2019-06-01 ENCOUNTER — Inpatient Hospital Stay (HOSPITAL_COMMUNITY): Payer: Medicare Other

## 2019-06-01 LAB — BASIC METABOLIC PANEL
Anion gap: 5 (ref 5–15)
BUN: 32 mg/dL — ABNORMAL HIGH (ref 6–20)
CO2: 19 mmol/L — ABNORMAL LOW (ref 22–32)
Calcium: 8.2 mg/dL — ABNORMAL LOW (ref 8.9–10.3)
Chloride: 108 mmol/L (ref 98–111)
Creatinine, Ser: 0.63 mg/dL (ref 0.44–1.00)
GFR calc Af Amer: >60 mL/min (ref >60)
GFR calc non Af Amer: >60 mL/min (ref >60)
Glucose, Bld: 151 mg/dL — ABNORMAL HIGH (ref 70–99)
Potassium: 4.6 mmol/L (ref 3.5–5.1)
Sodium: 132 mmol/L — ABNORMAL LOW (ref 135–145)

## 2019-06-01 LAB — PHOSPHORUS: Phosphorus: 3.8 mg/dL (ref 2.5–4.6)

## 2019-06-01 LAB — GLUCOSE, CAPILLARY
Glucose-Capillary: 138 mg/dL — ABNORMAL HIGH (ref 70–99)
Glucose-Capillary: 178 mg/dL — ABNORMAL HIGH (ref 70–99)
Glucose-Capillary: 244 mg/dL — ABNORMAL HIGH (ref 70–99)
Glucose-Capillary: 138 mg/dL — ABNORMAL HIGH (ref 70–99)

## 2019-06-01 LAB — MAGNESIUM: Magnesium: 1.8 mg/dL (ref 1.7–2.4)

## 2019-06-01 MED ORDER — TRAVASOL 10 % IV SOLN
INTRAVENOUS | Status: AC
  Filled 2019-06-01: qty 1080

## 2019-06-01 MED ORDER — MAGNESIUM SULFATE 2 GM/50ML IV SOLN
2.0000 g | Freq: Once | INTRAVENOUS | Status: AC
  Administered 2019-06-01: 2 g via INTRAVENOUS
  Filled 2019-06-01: qty 50

## 2019-06-01 MED ORDER — VANCOMYCIN HCL 1250 MG/250ML IV SOLN
1250.0000 mg | INTRAVENOUS | Status: AC
  Administered 2019-06-01 – 2019-06-05 (×3): 1250 mg via INTRAVENOUS
  Filled 2019-06-01 (×3): qty 250

## 2019-06-01 NOTE — Progress Notes (Signed)
Progress Note: General Surgery Service   Chief Complaint/Subjective: Did not sleep well.  C/o GERD  Objective: Vital signs in last 24 hours: Temp:  [98 F (36.7 C)-100.9 F (38.3 C)] 98.6 F (37 C) (03/21 0859) Pulse Rate:  [72-88] 77 (03/21 0859) Resp:  [18-20] 18 (03/21 0859) BP: (124-142)/(57-66) 124/60 (03/21 0859) SpO2:  [94 %-98 %] 96 % (03/21 0859) Last BM Date: 05/31/19  Intake/Output from previous day: 03/20 0701 - 03/21 0700 In: 858.2 [P.O.:270; I.V.:588.2] Out: 1935 [MWNUU:7253; Drains:15; GUYQI:347] Intake/Output this shift: Total I/O In: 619.3 [P.O.:240; I.V.:379.3] Out: 225 [Urine:225]  Gen: somnolent but arousable  Resp: nonlabored  Card: RRR  Abd: soft, ECF with some gas and small amount of fluid in bag  Lab Results: CBC  Recent Labs    05/30/19 0903 05/31/19 1020  WBC 12.1* 9.6  HGB 10.2* 9.9*  HCT 31.7* 30.6*  PLT 393 367   BMET Recent Labs    05/31/19 1020 06/01/19 0317  NA 131* 132*  K 4.8 4.6  CL 106 108  CO2 20* 19*  GLUCOSE 178* 151*  BUN 28* 32*  CREATININE 0.83 0.63  CALCIUM 8.2* 8.2*   PT/INR No results for input(s): LABPROT, INR in the last 72 hours. ABG No results for input(s): PHART, HCO3 in the last 72 hours.  Invalid input(s): PCO2, PO2  Anti-infectives: Anti-infectives (From admission, onward)   Start     Dose/Rate Route Frequency Ordered Stop   06/01/19 1200  vancomycin (VANCOCIN) IVPB 1000 mg/200 mL premix  Status:  Discontinued     1,000 mg 200 mL/hr over 60 Minutes Intravenous Every 48 hours 05/31/19 2352 06/01/19 0813   06/01/19 1200  vancomycin (VANCOREADY) IVPB 1250 mg/250 mL     1,250 mg 166.7 mL/hr over 90 Minutes Intravenous Every 48 hours 06/01/19 0813 06/05/19 2359   05/31/19 1438  vancomycin variable dose per unstable renal function (pharmacist dosing)  Status:  Discontinued      Does not apply See admin instructions 05/31/19 1438 05/31/19 2351   05/25/19 2200  vancomycin (VANCOCIN) IVPB 1000  mg/200 mL premix  Status:  Discontinued     1,000 mg 200 mL/hr over 60 Minutes Intravenous Every 24 hours 05/25/19 0958 05/31/19 1438   05/24/19 2200  vancomycin (VANCOREADY) IVPB 750 mg/150 mL  Status:  Discontinued     750 mg 150 mL/hr over 60 Minutes Intravenous Every 24 hours 05/24/19 1156 05/25/19 0958   05/23/19 2200  vancomycin (VANCOCIN) IVPB 1000 mg/200 mL premix     1,000 mg 200 mL/hr over 60 Minutes Intravenous  Once 05/23/19 1345 05/24/19 0008   05/23/19 1113  vancomycin variable dose per unstable renal function (pharmacist dosing)  Status:  Discontinued      Does not apply See admin instructions 05/23/19 1114 05/24/19 1156   05/22/19 1500  vancomycin (VANCOREADY) IVPB 1250 mg/250 mL  Status:  Discontinued     1,250 mg 166.7 mL/hr over 90 Minutes Intravenous Every 24 hours 05/22/19 1429 05/23/19 1114      Medications: Scheduled Meds: . amLODipine  5 mg Oral Daily  . chlorhexidine  15 mL Mouth Rinse BID  . Chlorhexidine Gluconate Cloth  6 each Topical Daily  . enoxaparin (LOVENOX) injection  40 mg Subcutaneous Q24H  . escitalopram  10 mg Oral Daily  . gabapentin  300 mg Oral BID  . insulin aspart  0-15 Units Subcutaneous Q6H  . loperamide  2 mg Oral Q8H  . mouth rinse  15 mL Mouth Rinse  q12n4p  . Melatonin  3 mg Oral QHS  . metoprolol tartrate  50 mg Oral BID  . multivitamin with minerals  1 tablet Oral Daily  . pantoprazole  40 mg Oral Daily  . simethicone  80 mg Oral QID   Continuous Infusions: . magnesium sulfate bolus IVPB 2 g (06/01/19 0949)  . TPN ADULT (ION) 75 mL/hr at 06/01/19 0800  . vancomycin     PRN Meds:.acetaminophen **OR** acetaminophen, ondansetron **OR** ondansetron (ZOFRAN) IV, oxyCODONE, sodium chloride flush  Assessment/Plan: s/p Procedure(s): TRANSESOPHAGEAL ECHOCARDIOGRAM (TEE) 05/27/2019  Perforated prepyloric ulcer S/pEXPLORATORY LAPAROTOMY WITH OVERSEW OF DUODENAL ULCER with [name] patch11/23/2020 Dr. Lucia Gaskins [facility] Fistula -JP  surgical drainfell out3/14/21and 1 IR drain placed 12/16/20in place with enteric contents  - UGI3/12showed2 separate enterocutaneous fistulae - on mechanical soft diet per SP - continue strict I&O's of output from ECF and drain - TPN restarted 3/18 via R PICC - patient anxious to go home  ID - vancomycin 3/11>> VTE -SCDs, lovenox Foley -in place Follow up -TBD   LOS: 10 days   Rosario Adie, MD [phone] [facility]

## 2019-06-01 NOTE — Progress Notes (Signed)
PHARMACY - TOTAL PARENTERAL NUTRITION CONSULT NOTE   Indication: Fistula  Patient Measurements: Height: 5\' 2"  (157.5 cm) Weight: 134 lb 7.7 oz (61 kg) IBW/kg (Calculated) : 50.1 TPN AdjBW (KG): 54.6 Body mass index is 24.6 kg/m.  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  Significant history of DM with bilateral BKA.  Presenting from SNF with MRSA bacteremia, suspected source from PICC.  Has been on oral diet this admission with line holiday however decreased appetite and may not be absorbing much per RD, unable to meet requirements.  Moderate malnutrition of chronic disease per RD assessment.     Glucose / Insulin: History of DM and requirement of insulin in past TPNs. Previously this admission on D10 to maintain euglycemia before TPN start. Current CBGs controlled on moderate SSI at 121-178, required 8 units of Novolog insulin in last 24 hours.  Electrolytes: Na low at 121, K 4.6, CO2 19, Mag 1.8, Phos 3.8, and CoCa 10.2. Renal: SCr 0.83>>0.63 - stable. BUN up 32.  LFTs / TGs: LFTs/Tbili within normal limits. TG 181.  Prealbumin / albumin: Prealbumin 7/ Albumin 1.5 Intake / Output; MIVF: UOP 1.1 mL/kg/hr. Colostomy output 270 mL. JP drain 15 mL. -1L yesterday.  GI Imaging: none since start of TPN Surgeries / Procedures: none since start of TPN  Central access: PICC replaced 05/29/19 TPN start date: 05/29/19  Nutritional Goals (per RD recommendation on 3/18): kCal: 1800-2000, Protein: 83-110g, Fluid: >= 1.9L Goal TPN rate is 75 mL/hr (provides 108 g of protein and 1819 kcals per day)  Current Nutrition:  Soft diet - Dysphagia 2 --intake 0% per meal yesterday and no supplements   Plan:  Continue custom TPN to goal rate of 75 mL/hr at 1800. Electrolytes in TPN: 68mEq/L of Na, 25 mEq/L of K (2mEq/day), 2.76mEq/L of Ca,  45mEq/L of Mg, and 48mmol/L of Phos. Maximize Acetate. Patient receiving oral MVI which contains  trace - will not add to TPN.  Continue  Moderate q6h SSI and adjust as needed  Monitor TPN labs on Mon/Thurs, and daily with start of TPN.   Magnesium 2g IV x1 today Monitor po intake and plan for TPN long-term/discharge.  If CBGs remain controlled, consider transition to cyclic TPN pending long-term plans.   Sloan Leiter, PharmD, BCPS, BCCCP Clinical Pharmacist Please refer to [facility] for [place] numbers 06/01/2019,8:25 AM

## 2019-06-01 NOTE — Progress Notes (Signed)
PHARMACY CONSULT NOTE FOR:  OUTPATIENT  PARENTERAL ANTIBIOTIC THERAPY (OPAT)  Indication: MRSA Bacteremia Regimen: Vancomycin 1250mg  IV Q 48 hrs End date: 06/05/19  IV antibiotic discharge orders are pended. To discharging provider:  please sign these orders via discharge navigator,  Select New Orders & click on the button choice - Manage This Unsigned Work.    Thank you for allowing pharmacy to be a part of this patient's care.  Alycia Rossetti, PharmD, BCPS Clinical Pharmacist Clinical phone for 06/01/2019: [phone] 06/01/2019 8:20 AM   **Pharmacist phone directory can now be found on [email or web address] ([email or web address]).  Listed under [place].    Please check AMION for all [place] phone numbers After 10:00 PM, call [place] [phone]

## 2019-06-01 NOTE — Progress Notes (Signed)
PROGRESS NOTE    Gina [name]  YTK:[id number] DOB: 1961-02-06 DOA: 05/22/2019 PCP: [name], MD   Brief Narrative:  Complex patient: 59 year old female with DM, HTN, schizophrenia, bilateral BKA sent from [facility] due to positive blood culture. Recent admission November 2020 for perforated prepyloric ulcer with septic shock with acute peritonitis status post exlap with [name] patch on 11/23, and was discharged to [facility] on Unasyn Bactrim and antifungal for total 6 weeks. Patient was discharged with Foley catheter, PICC line, bilateral lower quadrant drains along with enterocutaneous fistula with colostomy bag . She was discharged from [facility] [place] to [facility] whiere she has been living for last 3 months. Patient was sent to ER for vague sleepiness malaise anorexia low-grade fever and leukocytosis at SNF, and in the ER exam labs unremarkable and patient was sent back to SNF but overnight blood culture returned MRSA 2/2 and she was readmitted to the hospital. Patient is being followed by infectious disease, general surgery.She did well with speech, diet was restarted, PICC line was removed as no longer needing TPN and also needing line holidays given her MRSA bacteremia. Foley catheter was exchanged. Repeat blood culture negative from 3/2. ID has advised to continue vancomycin through 3/25 and ordered PICC line 3/17. Surgery has been following patient and on calorie count, her intestinal obstruction is not reliable so if it is not ideal and if not able to meet calorie count will likely need to go back on TPN. Patient and family planning to return home with home health services upon discharge and has declined skilled nursing facility placement.  Patient has a PICCplaced 3/18-started back on TPN   Assessment & Plan:   Principal Problem:   MRSA bacteremia Active Problems:   HTN (hypertension)   Diabetes ([place])   Severe protein-calorie malnutrition ([place])   Anemia of chronic  disease   Type 2 diabetes mellitus with hyperosmolar nonketotic hyperglycemia (HCC)   Pressure injury of right thigh, unstageable (HCC)   Below-knee amputee (HCC)   Atherosclerosis of native arteries of extremities with gangrene, right leg (HCC)   Intra-abdominal fluid collection   Palliative care by specialist   Goals of care, counseling/discussion   DNR (do not resuscitate)   Physical deconditioning   Dysphagia   Malnutrition of moderate degree   MRSA bacteremia:indwelling PICC line used for TPN is felt likely source and PICC is out. Appreciate ID on board. For TEE 3/16. Repeat blood cultures 3/12- Negative so far.TTE negative for endocarditis. Appreciate ID input, status post PICCplaced 3/18 and continue vancomycin through 3/25.   Patient spiked fever overnight, checking urine, check and blood cultures, chest x-ray is unchanged  Intra-abdominal fluid collection with recent ex lap/partial small bowel resection and intra-abdominal abscess in November.General surgery following, right-sided drain came up 3/13 and surgery aware and dressing in place.enterocutaneous fistula in placement w/ liquid bowel movement.Continue on increasedImodium to decrease for stool output-discusssed with surg sat, no changes form their perspective  Moderate protein-calorie malnutrition:was TPN dependent, dietitian on consult. Now on oral diet and calorie count-surgery planning to address with TPN if unable to meet her requirement, as her intestinal obstruction is questionable and not ideal candidate for tube feeding.Per surgery patient is back on TPN, who is the clinical pharmacist today monitoring for next 24 hours to see patient is to have insulin placed on her TPN bag as blood sugar trending up and has required insulin previously, cont to monitor  Bilateral stump wounds continue wound care and dressing.  Recent admission  November 2020 for perforated prepyloric ulcer with septic shock with acute  peritonitis status post exlap with [name] patch on 11/23-patient undergoing bilateral lower quadrant drain, enterocutaneous fistula with colostomy bag. Upper GI shows fistulous connection at 2 points in the small bowel (proximal small bowel, suspect 1st site of communication is proximal jejunum) connecting to the known collection in the anterior abdomen and io the skin. May need drain injection to further characterize fistula in the future as well as EGD per surgery and will need to follow-up as outpatient  HTN:BP well controlled, cont amlodipine.  Metabolic acidosis/elevated BUN/dehydration:Dehydration likely in the setting of EC fistula output. Tolerating diet-gentle IV fluid hydration. BUN 28. She is off ivf.   Diabetes mellitus: hbA1c 5.9 3/12.She hadhypoglycemia-blood sugar 67(3/12)- stoppedlantus and on ssi.Now blood sugar trending up as we will start her on TPN. May need to be back on insulin so CONT to monitor.  Anemia of chronic disease:Previously hemoglobin has been ranging from 9 to 8 g, and drifting downward. 6.7 g today patient appears weak and symptomatic. Discussed next benefits and agreed for transfusion s/p2 unit PRBC, h/h up in 10 gm.Anemia panel ferritin 646, iron low at 22, B12 750, folate 22. Hemoccult was negative. No Signs of active bleeding.Monitor closely. 9.9 on 3/20  Below-knee amputee/Atherosclerosis of native arteries: Stump evaluation by wound care for her ilateral stump wounds   Transaminitis:Suspect from TPN. Monitor closely.  Asymptomatic bacteriuria: replaceD Foley after admission.  GERD: on PPI  Mood disorder:on Celexa  Goals of care: Palliative care consulted patient is DNR MOST form filled.Per recommendation to treat what is treatable and plan on palliative care referral upon discharge.Given significant comorbidities malnutrition patient is at high risk for readmission and decompensation.  Interventions: Refer to RD note for  recommendations Body mass index is 24.19 kg/m. Pressure Ulcer: Pressure Injury 05/22/19 Coccyx Bilateral Stage 2 - Partial thickness loss of dermis presenting as a shallow open injury with a red, pink wound bed without slough. (Active)  05/22/19 2104  Location: Coccyx  Location Orientation: Bilateral  Staging: Stage 2 - Partial thickness loss of dermis presenting as a shallow open injury with a red, pink wound bed without slough.  Wound Description (Comments):   Present on Admission: Yes    Pressure Injury 05/22/19 Knee Right;Distal Unstageable - Full thickness tissue loss in which the base of the injury is covered by slough (yellow, tan, gray, green or brown) and/or eschar (tan, brown or black) in the wound bed. Stump (Active)  05/22/19 2106  Location: Knee  Location Orientation: Right;Distal  Staging: Unstageable - Full thickness tissue loss in which the base of the injury is covered by slough (yellow, tan, gray, green or brown) and/or eschar (tan, brown or black) in the wound bed.  Wound Description (Comments): Stump  Present on Admission: Yes    Pressure Injury 05/22/19 Knee Left;Distal Unstageable - Full thickness tissue loss in which the base of the injury is covered by slough (yellow, tan, gray, green or brown) and/or eschar (tan, brown or black) in the wound bed. Stump (Active)  05/22/19 2108  Location: Knee  Location Orientation: Left;Distal  Staging: Unstageable - Full thickness tissue loss in which the base of the injury is covered by slough (yellow, tan, gray, green or brown) and/or eschar (tan, brown or black) in the wound bed.  Wound Description (Comments): Stump  Present on Admission: Yes    Pressure Injury 05/22/19 Thigh Left;Mid Stage 2 - Partial thickness loss of dermis presenting as a  shallow open injury with a red, pink wound bed without slough. (Active)  05/22/19 2109  Location: Thigh  Location Orientation: Left;Mid  Staging: Stage 2 - Partial thickness  loss of dermis presenting as a shallow open injury with a red, pink wound bed without slough.  Wound Description (Comments):   Present on Admission: Yes     DVT prophylaxis: Lovenox SQ  Code Status: DNR    Code Status Orders  (From admission, onward)         Start     Ordered   05/23/19 0752  Do not attempt resuscitation (DNR)  Continuous    Question Answer Comment  In the event of cardiac or respiratory ARREST Do not call a "code blue"   In the event of cardiac or respiratory ARREST Do not perform Intubation, CPR, defibrillation or ACLS   In the event of cardiac or respiratory ARREST Use medication by any route, position, wound care, and other measures to relive pain and suffering. May use oxygen, suction and manual treatment of airway obstruction as needed for comfort.      05/23/19 0751        Code Status History    Date Active Date Inactive Code Status Order ID Comments User Context   05/22/2019 1624 05/23/2019 0751 Full Code [id number]  Edwin Dada, MD ED   05/21/2019 1613 05/21/2019 1702 DNR [id number]  [name], MD ED   03/10/2019 1527 04/23/2019 0252 DNR [id number]  Janyth Pupa Inpatient   03/05/2019 0758 03/10/2019 1449 DNR [id number]  Jimmy Footman, NP Inpatient   02/03/2019 0251 03/05/2019 0758 Full Code [id number]  Bennie Pierini, MD Inpatient   12/26/2018 0553 12/30/2018 1846 Full Code [id number]  [name], MD ED   11/25/2018 0304 11/28/2018 1950 Full Code [id number]  [name], MD ED   05/09/2018 0146 05/21/2018 1805 Full Code [id number]  [name], MD Inpatient   03/13/2018 2234 03/14/2018 1625 Full Code [id number]  Caccavale, Sophia, PA-C ED   11/06/2017 1209 11/16/2017 1656 Full Code [id number]  [name], MD ED   02/22/2017 2342 02/26/2017 2155 Full Code [id number]  [name], MD ED   02/20/2017 2300 02/21/2017 2220 Full Code [id number]  Ward, [name], PA-C ED   06/01/2016 1944 06/07/2016 2220 Full Code  [id number]  [name], MD Inpatient   06/18/2013 1157 06/21/2013 1240 Full Code [id number]  [name], MD ED   07/31/2012 0518 08/03/2012 1526 Full Code [id number]  Theressa Millard, MD ED   Advance Care Planning Activity    Advance Directive Documentation     Most Recent Value  Type of Advance Directive  Out of facility DNR (pink MOST or yellow form)  Pre-existing out of facility DNR order (yellow form or pink MOST form)  --  "MOST" Form in Place?  --     Family Communication: tried calling daughter [phone], NA Disposition Plan:   Patient is from: SNF Anticipated Disposition: to home with home and services. Patient reports she is not going back to skilled nursing facility.  Barriers to discharge or conditions that needs to be met prior to discharge:Admitted forMRSA bacteremia treatment, and protein calorie malnutrition-off TPN and a PICC line due to bacteremia and now back on new PICC line with TPN as oral intake not reliable-patient has been recommended for a skilled nursing facility but she has declined and wants to return home with daughter, difficult disposition, CM following for feasibility pfhome health servicesgiven her needs:TPN/IV vancomycin.Monitoring  for insulin requirement for additional 24 hourswhile on TPN as well as evaluation for new fever Consults called: None Admission status: Inpatient   Consultants:   Infectious disease, general surgery  Procedures:  CT ABDOMEN PELVIS W CONTRAST  Result Date: 05/21/2019 CLINICAL DATA:  Acute generalized abdominal pain. EXAM: CT ABDOMEN AND PELVIS WITH CONTRAST TECHNIQUE: Multidetector CT imaging of the abdomen and pelvis was performed using the standard protocol following bolus administration of intravenous contrast. CONTRAST:  142m OMNIPAQUE IOHEXOL 300 MG/ML  SOLN COMPARISON:  April 11, 2019. FINDINGS: Lower chest: Mild right pleural effusion is noted with associated atelectasis of the right lower lobe. Mild  left basilar subsegmental atelectasis is noted. Hepatobiliary: Solitary gallstone is noted. No biliary dilatation is noted. The liver is unremarkable. Pancreas: Unremarkable. No pancreatic ductal dilatation or surrounding inflammatory changes. Spleen: Normal in size without focal abnormality. Adrenals/Urinary Tract: Adrenal glands and kidneys appear normal. No hydronephrosis or renal obstruction is noted. Urinary bladder is decompressed secondary to Foley catheter. Stomach/Bowel: The stomach is unremarkable. There is no evidence of bowel obstruction or inflammation. The appendix is unremarkable. Vascular/Lymphatic: No significant vascular findings are present. No enlarged abdominal or pelvic lymph nodes. Reproductive: Uterus and bilateral adnexa are unremarkable. Other: Surgical drain is seen passing underneath the liver with distal tip inferior to the left hepatic lobe. Continued presence of pigtail catheter within complex fluid collection anteriorly in the abdomen concerning for abscess. It is significantly smaller compared to prior exam, currently measuring 7.9 x 1.5 cm. Musculoskeletal: No acute or significant osseous findings. IMPRESSION: 1. Mild right pleural effusion is noted with associated atelectasis of the right lower lobe. 2. Solitary gallstone is noted. 3. Surgical drain is seen passing underneath the liver with distal tip inferior to the left hepatic lobe. Continued presence of complex fluid collection anteriorly in the abdomen concerning for abscess. It is significantly smaller compared to prior exam, currently measuring 7.9 x 1.5 cm. Pigtail drainage catheter is unchanged in position. Electronically Signed   By: JMarijo ConceptionM.D.   On: 05/21/2019 13:36   DG Chest Port 1 View  Result Date: 06/01/2019 CLINICAL DATA:  Fever evaluation. EXAM: PORTABLE CHEST 1 VIEW COMPARISON:  Chest radiograph 05/21/2019 FINDINGS: A right-sided PICC is present with tip projecting in the region of the upper right  atrium. Shallow inspiration radiograph with accentuation of the cardiac silhouette. Probable mild cardiomegaly, unchanged. Similar appearance of bibasilar opacities. Persistent small right pleural effusion. No evidence of pneumothorax. No acute bony abnormality IMPRESSION: Similar appearance of bibasilar opacities (greater on the right) which may reflect atelectasis or pneumonia. Persistent small right pleural effusion. Electronically Signed   By: KKellie SimmeringDO   On: 06/01/2019 09:11   DG Chest Port 1 View  Result Date: 05/21/2019 CLINICAL DATA:  Weakness, altered mental status EXAM: PORTABLE CHEST 1 VIEW COMPARISON:  03/26/2019 FINDINGS: Left PICC line in place with the tip in the SVC. Low lung volumes. Bibasilar airspace opacities, right greater than left. Small right pleural effusion. Heart is mildly enlarged. No acute bony abnormality. IMPRESSION: Bibasilar atelectasis or infiltrates, right greater than left. Small right pleural effusion. Electronically Signed   By: KRolm BaptiseM.D.   On: 05/21/2019 10:45   DG Swallowing Func-Speech Pathology  Result Date: 05/24/2019 Objective Swallowing Evaluation: Type of Study: MBS-Modified Barium Swallow Study  Patient Details Name: FAubrina NiemanMRN: [id number] of Birth: [birth year]Today's Date: 05/24/2019 Time: SLP Start Time (ACUTE ONLY): 12549-SLP Stop Time (ACUTE ONLY): 18264SLP Time  Calculation (min) (ACUTE ONLY): 8 min Past Medical History: Past Medical History: Diagnosis Date . Diabetes mellitus  . Hypertension  . Osteomyelitis of right foot ([place]) 02/22/2017 . Perforated gastric ulcer ([place])  . S/P bilateral BKA (below knee amputation) ([place])  . Schizophrenia ([place])  . Schizophrenia ([place])  . Septic arthritis of interphalangeal joint of toe of left foot ([place]) 06/02/2016 . Stress incontinence 04/26/2017 Past Surgical History: Past Surgical History: Procedure Laterality Date . AMPUTATION Left 06/05/2016  Procedure: LEFT FOOT TRANSMETATARSAL AMPUTATION;   Surgeon: [name], MD;  Location: [facility];  Service: Orthopedics;  Laterality: Left; . AMPUTATION Right 11/11/2017  Procedure: AMPUTATION BELOW KNEE;  Surgeon: [name], MD;  Location: [place];  Service: Orthopedics;  Laterality: Right; . AMPUTATION Left 05/10/2018  Procedure: LEFT BELOW KNEE AMPUTATION;  Surgeon: [name], MD;  Location: [place];  Service: Orthopedics;  Laterality: Left; . AMPUTATION Left 12/28/2018  Procedure: AMPUTATION THIRD DIGIT;  Surgeon: Iran Planas, MD;  Location: [facility];  Service: Orthopedics;  Laterality: Left; . AMPUTATION TOE Right 02/23/2017  Procedure: AMPUTATION RIGHT THIRD TOE;  Surgeon: [name], MD;  Location: [place];  Service: Orthopedics;  Laterality: Right; . I & D EXTREMITY Right 11/09/2017  Procedure: Debride Ulcer Right Heel;  Surgeon: [name], MD;  Location: [place];  Service: Orthopedics;  Laterality: Right; . I & D EXTREMITY Left 12/28/2018  Procedure: IRRIGATION AND DEBRIDEMENT EXTREMITY;  Surgeon: Iran Planas, MD;  Location: [facility];  Service: Orthopedics;  Laterality: Left; . INCISION AND DRAINAGE OF WOUND Left 12/26/2018  Procedure: IRRIGATION AND DEBRIDEMENT LEFT HAND;  Surgeon: Iran Planas, MD;  [facility];  Service: Orthopedics;  Laterality: Left; . LAPAROTOMY N/A 02/02/2019  Procedure: EXPLORATORY LAPAROTOMY WITH OVERSEW OF DUODENAL ULCER;  Surgeon: Alphonsa Overall, MD;  Location: [facility];  Service: General;  Laterality: N/A; . TUBAL LIGATION   HPI: Rashae Rother is a 59 y.o. F with hx DM, HTN, bilateral BKA, schizophrenia, and recent perforated ulcer leading to septic shock, DKA, fungemia, and persistent peritoneal abscess who presents with malaise, found to have MRSA bacteremia. Patient admitted in Nov 2020 for septic shock, found to have acute peritonitis. [facility], and anidulafungin for total 6 weeks. Based on chart review, patient was seen for dysphagia and participated in  [facility] 03/2019 while at [facility]. However, full notes not available. She was then discharged from [facility] to [facility], where she has been for last three months.  Pt now admitted with MRSA. CXR 3/10: Bibasilar atelectasis or infiltrates, right greater than left. Small right pleural effusion.  No data recorded Assessment / Plan / Recommendation CHL IP CLINICAL IMPRESSIONS 05/24/2019 Clinical Impression Patient presensts with a largely normal, age appropriate, oropharyngeal swallow. Oral phase timely with swift oral transit of bolus. Swallow triggered mostly at the vallecula with one episode of frank penetration of thin liquids which remained under the epiglottis post swallow however cleared spontaneously with subsequent swallows before reaching the level of the vocal cords. No additional episodes of penetration or aspiration despite being challenged with multiple consecutive straw sips of liquid. Note that patient unable to orally  transit pill with thin liquids, able with pureed solids, however no trouble reported at bedside with pills by patient or RN. Recommend initiation of regular texture solids, thin liquid (per MD, "diet as tolerated"). SLP will f/u briefly for tolerance.  SLP Visit Diagnosis Dysphagia, unspecified (R13.10) Attention and concentration deficit following --  Frontal lobe and executive function deficit following -- Impact on safety and function No limitations   CHL IP TREATMENT RECOMMENDATION 05/24/2019 Treatment Recommendations Therapy as outlined in treatment plan below   Prognosis 05/23/2019 Prognosis for Safe Diet Advancement Fair Barriers to Reach Goals Severity of deficits;Time post onset Barriers/Prognosis Comment -- CHL IP DIET RECOMMENDATION 05/24/2019 SLP Diet Recommendations Regular solids;Thin liquid Liquid Administration via Cup;Straw Medication Administration Whole meds with liquid Compensations Slow rate;Small sips/bites Postural Changes Seated upright at 90 degrees   CHL IP OTHER  RECOMMENDATIONS 05/24/2019 Recommended Consults -- Oral Care Recommendations Oral care BID Other Recommendations --   CHL IP FOLLOW UP RECOMMENDATIONS 05/24/2019 Follow up Recommendations None   CHL IP FREQUENCY AND DURATION 05/24/2019 Speech Therapy Frequency (ACUTE ONLY) min 1 x/week Treatment Duration 1 week      CHL IP ORAL PHASE 05/24/2019 Oral Phase WFL Oral - Pudding Teaspoon -- Oral - Pudding Cup -- Oral - Honey Teaspoon -- Oral - Honey Cup -- Oral - Nectar Teaspoon -- Oral - Nectar Cup -- Oral - Nectar Straw -- Oral - Thin Teaspoon -- Oral - Thin Cup -- Oral - Thin Straw -- Oral - Puree -- Oral - Mech Soft -- Oral - Regular -- Oral - Multi-Consistency -- Oral - Pill -- Oral Phase - Comment --  CHL IP PHARYNGEAL PHASE 05/24/2019 Pharyngeal Phase WFL Pharyngeal- Pudding Teaspoon -- Pharyngeal -- Pharyngeal- Pudding Cup -- Pharyngeal -- Pharyngeal- Honey Teaspoon -- Pharyngeal -- Pharyngeal- Honey Cup -- Pharyngeal -- Pharyngeal- Nectar Teaspoon -- Pharyngeal -- Pharyngeal- Nectar Cup -- Pharyngeal -- Pharyngeal- Nectar Straw -- Pharyngeal -- Pharyngeal- Thin Teaspoon -- Pharyngeal -- Pharyngeal- Thin Cup -- Pharyngeal -- Pharyngeal- Thin Straw -- Pharyngeal -- Pharyngeal- Puree -- Pharyngeal -- Pharyngeal- Mechanical Soft -- Pharyngeal -- Pharyngeal- Regular -- Pharyngeal -- Pharyngeal- Multi-consistency -- Pharyngeal -- Pharyngeal- Pill -- Pharyngeal -- Pharyngeal Comment --  CHL IP CERVICAL ESOPHAGEAL PHASE 05/24/2019 Cervical Esophageal Phase WFL Pudding Teaspoon -- Pudding Cup -- Honey Teaspoon -- Honey Cup -- Nectar Teaspoon -- Nectar Cup -- Nectar Straw -- Thin Teaspoon -- Thin Cup -- Thin Straw -- Puree -- Mechanical Soft -- Regular -- Multi-consistency -- Pill -- Cervical Esophageal Comment -- Gabriel Rainwater MA, CCC-SLP McCoy Leah Meryl 05/24/2019, 11:11 AM              ECHOCARDIOGRAM COMPLETE  Result Date: 05/23/2019    ECHOCARDIOGRAM REPORT   Patient Name:   Gina [name] Date of Exam: 05/23/2019 Medical  Rec #:  [id number]       Height:       62.0 in Accession #:    [id number]      Weight:       120.4 lb Date of Birth:  [birth year]        BSA:          1.541 m Patient Age:    51 years        BP:           128/61 mmHg Patient Gender: F               HR:           76 bpm. Exam Location:  Inpatient Procedure: 2D Echo Indications:    bacteremia 790.7  History:        Patient has prior history of Echocardiogram examinations, most                 recent 03/04/2019. Signs/Symptoms:Bacteremia; Risk  Factors:Diabetes and Hypertension.  Sonographer:    Johny Chess Referring Phys: [id number] Sedric Guia P [place] IMPRESSIONS  1. Left ventricular ejection fraction, by estimation, is 55 to 60%. The left ventricle has normal function. The left ventricle has no regional wall motion abnormalities. Left ventricular diastolic parameters were normal.  2. Right ventricular systolic function is normal. The right ventricular size is normal.  3. The mitral valve is normal in structure. No evidence of mitral valve regurgitation. No evidence of mitral stenosis.  4. The aortic valve is normal in structure. Aortic valve regurgitation is not visualized. No aortic stenosis is present. FINDINGS  Left Ventricle: Left ventricular ejection fraction, by estimation, is 55 to 60%. The left ventricle has normal function. The left ventricle has no regional wall motion abnormalities. The left ventricular internal cavity size was normal in size. There is  no left ventricular hypertrophy. Left ventricular diastolic parameters were normal. Right Ventricle: The right ventricular size is normal. No increase in right ventricular wall thickness. Right ventricular systolic function is normal. Left Atrium: Left atrial size was normal in size. Right Atrium: Right atrial size was normal in size. Pericardium: There is no evidence of pericardial effusion. Mitral Valve: The mitral valve is normal in structure. No evidence of mitral valve regurgitation.  No evidence of mitral valve stenosis. Tricuspid Valve: The tricuspid valve is normal in structure. Tricuspid valve regurgitation is trivial. Aortic Valve: The aortic valve is normal in structure. Aortic valve regurgitation is not visualized. No aortic stenosis is present. Pulmonic Valve: The pulmonic valve was normal in structure. Pulmonic valve regurgitation is not visualized. Aorta: The aortic root and ascending aorta are structurally normal, with no evidence of dilitation. IAS/Shunts: The atrial septum is grossly normal.  LEFT VENTRICLE PLAX 2D LVIDd:         4.30 cm  Diastology LVIDs:         3.10 cm  LV e' lateral:   8.81 cm/s LV PW:         1.00 cm  LV E/e' lateral: 12.3 LV IVS:        0.80 cm  LV e' medial:    6.20 cm/s LVOT diam:     1.70 cm  LV E/e' medial:  17.4 LV SV:         57 LV SV Index:   37 LVOT Area:     2.27 cm  RIGHT VENTRICLE RV S prime:     12.00 cm/s TAPSE (M-mode): 1.8 cm LEFT ATRIUM             Index       RIGHT ATRIUM           Index LA diam:        2.80 cm 1.82 cm/m  RA Area:     10.80 cm LA Vol (A2C):   34.3 ml 22.26 ml/m RA Volume:   21.90 ml  14.21 ml/m LA Vol (A4C):   34.5 ml 22.39 ml/m LA Biplane Vol: 36.3 ml 23.56 ml/m  AORTIC VALVE LVOT Vmax:   139.00 cm/s LVOT Vmean:  90.400 cm/s LVOT VTI:    0.251 m  AORTA Ao Root diam: 2.60 cm MITRAL VALVE MV Area (PHT): 2.62 cm     SHUNTS MV Decel Time: 289 msec     Systemic VTI:  0.25 m MV E velocity: 108.00 cm/s  Systemic Diam: 1.70 cm MV A velocity: 102.00 cm/s MV E/A ratio:  1.06 Mertie Moores MD Electronically signed by Mertie Moores MD Signature Date/Time:  05/23/2019/11:26:04 AM    Final    ECHO TEE  Result Date: 05/27/2019    TRANSESOPHOGEAL ECHO REPORT   Patient Name:   HANYA GUERIN Date of Exam: 05/27/2019 Medical Rec #:  [id number]       Height:       62.0 in Accession #:    [id number]      Weight:       120.4 lb Date of Birth:  02/04/1961        BSA:          1.541 m Patient Age:    52 years        BP:           90/41 mmHg  Patient Gender: F               HR:           62 bpm. Exam Location:  Inpatient Procedure: Transesophageal Echo, Cardiac Doppler and Color Doppler Indications:     Bacteremia  History:         Patient has prior history of Echocardiogram examinations, most                  recent 05/23/2019. Signs/Symptoms:Fever; Risk Factors:Diabetes                  and Hypertension. MRSA                  Septic shock.  Sonographer:     Vikki Ports Turrentine Referring Phys:  [id number] Abigail Butts Diagnosing Phys: Skeet Latch MD PROCEDURE: The transesophogeal probe was passed without difficulty through the esophogus of the patient. Sedation performed by different physician. The patient's vital signs; including heart rate, blood pressure, and oxygen saturation; remained stable throughout the procedure. The patient developed no complications during the procedure. IMPRESSIONS  1. Left ventricular ejection fraction, by estimation, is 55 to 60%. The left ventricle has normal function. The left ventricle has no regional wall motion abnormalities. Left ventricular diastolic function could not be evaluated.  2. Right ventricular systolic function is normal. The right ventricular size is normal.  3. No left atrial/left atrial appendage thrombus was detected.  4. The mitral valve is normal in structure. Mild mitral valve regurgitation. No evidence of mitral stenosis.  5. The aortic valve is tricuspid. Aortic valve regurgitation is not visualized. No aortic stenosis is present. Conclusion(s)/Recommendation(s): Normal biventricular function without evidence of hemodynamically significant valvular heart disease. FINDINGS  Left Ventricle: Left ventricular ejection fraction, by estimation, is 55 to 60%. The left ventricle has normal function. The left ventricle has no regional wall motion abnormalities. The left ventricular internal cavity size was normal in size. There is  no left ventricular hypertrophy. Left ventricular diastolic function  could not be evaluated. Right Ventricle: The right ventricular size is normal. No increase in right ventricular wall thickness. Right ventricular systolic function is normal. Left Atrium: Left atrial size was normal in size. No left atrial/left atrial appendage thrombus was detected. Right Atrium: Right atrial size was normal in size. Pericardium: Trivial pericardial effusion is present. Mitral Valve: The mitral valve is normal in structure. Normal mobility of the mitral valve leaflets. Mild mitral valve regurgitation. No evidence of mitral valve stenosis. Tricuspid Valve: The tricuspid valve is normal in structure. Tricuspid valve regurgitation is mild . No evidence of tricuspid stenosis. Aortic Valve: The aortic valve is tricuspid. Aortic valve regurgitation is not visualized. No aortic stenosis is present. Pulmonic Valve: The pulmonic  valve was normal in structure. Pulmonic valve regurgitation is trivial. No evidence of pulmonic stenosis. Aorta: The aortic root is normal in size and structure. IAS/Shunts: No atrial level shunt detected by color flow Doppler.  TRICUSPID VALVE TR Peak grad:   49.0 mmHg TR Vmax:        350.00 cm/s Skeet Latch MD Electronically signed by Skeet Latch MD Signature Date/Time: 05/27/2019/5:12:39 PM    Final    [place] EKG SITE RITE  Result Date: 05/28/2019 If Site Rite image not attached, placement could not be confirmed due to current cardiac rhythm.  DG UGI W SMALL BOWEL  Result Date: 05/23/2019 CLINICAL DATA:  Bowel leak. EXAM: UPPER GI SERIES WITH KUB TECHNIQUE: After obtaining a scout radiograph a routine upper GI series was performed using water-soluble contrast only FLUOROSCOPY TIME:  Fluoroscopy Time:  4 minutes 36 seconds Radiation Exposure Index (if provided by the fluoroscopic device): 1.2 mGy Number of Acquired Spot Images: 4 COMPARISON:  CT study of May 21, 2019 and prior study from April 11, 2019 FINDINGS: Initial scout images show a surgical drain in the  right upper quadrant and a pigtail drainage catheter near the midline. Study was performed only with water-soluble contrast. During the examination which was performed in the bilateral oblique and right lateral position the esophagus showed some evidence of tertiary peristalsis and a patulous appearance with moderate gastroesophageal reflux. Question of mild distal esophageal narrowing not well assessed. Stomach was grossly normal. Patient was placed in the right lateral position and contrast passed from the stomach into the small bowel. When contrast reached the level of the proximal jejunum a tract extended anteriorly towards the anterior abdominal wall and more distal small bowel loops began to fill, at least at 2 additional locations. Gas can be seen surrounding the drainage catheter. Paragraphs shortly after the tract extending from the jejunum is filled with contrast bowel loops to the right of the midline also began to fill and bowel loops inferior to the central abdominal collection. The at least 2 sites of communication or possible based on this study and based on review of previous imaging contrast did not fill the drain, likely passing via route of least resistance into the bowel beyond. 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 Difficult to map given the nature the process in communication with proximal small bowel where the first site of communication likely involves the proximal jejunum. Follow-up CT could be performed with positive enteric contrast. Alternatively, waiting for clearance of current contrast followed by a CT after drain injection could be helpful to further map this complex process as clinically warranted. Injection of the drain followed by CT would give the added advantage of three-dimensional relationships  and knowledge of what is in definitive communication with the collection. Electronically Signed   By: Zetta Bills M.D.   On: 05/23/2019 11:40     Antimicrobials:   Vancomycin till 3/25   Subjective: Patient had fever x2 overnight Indicated she felt warm Less verbal this morning  Objective: Vitals:   05/31/19 2136 06/01/19 0316 06/01/19 0534 06/01/19 0859  BP: 128/66 (!) 137/57 126/62 124/60  Pulse: 83 72 74 77  Resp: 20 20 18 18   Temp: (!) 100.9 F (38.3 C) 98 F (36.7 C) 98.6 F (37 C) 98.6 F (37 C)  TempSrc: Oral Axillary Oral Oral  SpO2:  94% 94% 95% 96%  Weight:      Height:        Intake/Output Summary (Last 24 hours) at 06/01/2019 1306 Last data filed at 06/01/2019 1132 Gross per 24 hour  Intake 1387.56 ml  Output 2160 ml  Net -772.44 ml   Filed Weights   05/28/19 0438 05/29/19 2128 05/30/19 2058  Weight: 53 kg 60 kg 61 kg    Examination: General exam: Appears calm and comfortable less verbal this morning Respiratory system: Clear to auscultation. Respiratory effort normal. Cardiovascular system: S1 & S2 heard, RRR. No JVD, murmurs, rubs, gallops or clicks. No pedal edema. Gastrointestinal system: Abdomen is nondistended, soft.  Small amount of fluid in bag  Central nervous system: Alert and oriented.  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 Skin: No new lesions, no bleedthrough Psychiatry:  poor judgment, poor insight,  mood & affect appropriate although less verbal this morning    Data Reviewed: I have personally reviewed following labs and imaging studies  CBC: Recent Labs  Lab 05/27/19 0429 05/28/19 0152 05/29/19 1003 05/30/19 0903 05/31/19 1020  WBC 7.7 11.5* 8.2 12.1* 9.6  NEUTROABS  --   --  5.5  --  6.1  HGB 7.5* 7.1* 6.7* 10.2* 9.9*  HCT 23.8* 22.8* 21.6* 31.7* 30.6*  MCV 96.7 98.3 96.4 91.1 91.3  PLT 344 390 397 393 237   Basic Metabolic Panel: Recent Labs  Lab 05/28/19 0152 05/29/19 1003 05/30/19 0407 05/31/19 1020  06/01/19 0317  NA 134* 131* 133* 131* 132*  K 4.1 4.3 4.4 4.8 4.6  CL 109 106 108 106 108  CO2 18* 18* 19* 20* 19*  GLUCOSE 91 93 152* 178* 151*  BUN 18 21* 18 28* 32*  CREATININE 0.84 0.97 0.82 0.83 0.63  CALCIUM 8.3* 8.1* 8.3* 8.2* 8.2*  MG  --  1.7 1.7 1.9 1.8  PHOS  --  4.3 3.9 3.3 3.8   GFR: Estimated Creatinine Clearance: 65.9 mL/min (by C-G formula based on SCr of 0.63 mg/dL). Liver Function Tests: Recent Labs  Lab 05/26/19 0354 05/27/19 0429 05/28/19 0152 05/29/19 1003 05/30/19 0407  AST 19 18 15  14* 12*  ALT 23 21 17 15 14   ALKPHOS 152* 135* 137* 128* 123  BILITOT 0.8 0.5 0.8 0.8 0.7  PROT 6.9 6.4* 6.7 6.5 6.6  ALBUMIN 1.6* 1.5* 1.5* 1.5* 1.5*   No results for input(s): LIPASE, AMYLASE in the last 168 hours. No results for input(s): AMMONIA in the last 168 hours. Coagulation Profile: No results for input(s): INR, PROTIME in the last 168 hours. Cardiac Enzymes: No results for input(s): CKTOTAL, CKMB, CKMBINDEX, TROPONINI in the last 168 hours. BNP (last 3 results) No results for input(s): PROBNP in the last 8760 hours. HbA1C: No results for input(s): HGBA1C in the last 72 hours. CBG: Recent Labs  Lab 05/31/19 2025 05/31/19 2354 06/01/19 0431 06/01/19 0729 06/01/19 1129  GLUCAP 141* 176* 138* 178* 244*   Lipid Profile: No results for input(s): CHOL, HDL, LDLCALC, TRIG, CHOLHDL, LDLDIRECT in the last 72 hours. Thyroid Function Tests: No results for input(s): TSH, T4TOTAL, FREET4, T3FREE, THYROIDAB in the last 72 hours. Anemia Panel: No results for input(s): VITAMINB12, FOLATE, FERRITIN, TIBC, IRON, RETICCTPCT in the last 72 hours. Sepsis Labs: No results for input(s): PROCALCITON, LATICACIDVEN in the last 168 hours.  Recent Results (from the past 240 hour(s))  SARS CORONAVIRUS 2 (TAT 6-24 HRS) Nasopharyngeal Nasopharyngeal Swab     Status: None   Collection Time: 05/22/19  2:45  PM   Specimen: Nasopharyngeal Swab  Result Value Ref Range Status    SARS Coronavirus 2 NEGATIVE NEGATIVE Final    Comment: (NOTE) SARS-CoV-2 target nucleic acids are NOT DETECTED. The SARS-CoV-2 RNA is generally detectable in upper and lower respiratory specimens during the acute phase of infection. Negative results do not preclude SARS-CoV-2 infection, do not rule out co-infections with other pathogens, and should not be used as the sole basis for treatment or other patient management decisions. Negative results must be combined with clinical observations, patient history, and epidemiological information. The expected result is Negative. Fact Sheet for Patients: SugarRoll.be Fact Sheet for Healthcare Providers: https://www.woods-mathews.com/ This test is not yet approved or cleared by the [place] FDA and  has been authorized for detection and/or diagnosis of SARS-CoV-2 by FDA under an Emergency Use Authorization (EUA). This EUA will remain  in effect (meaning this test can be used) for the duration of the COVID-19 declaration under Section 56 4(b)(1) of the Act, 21 U.S.C. section 360bbb-3(b)(1), unless the authorization is terminated or revoked sooner. Performed at [facility], [place] [address], [place], [place] [address]   Culture, blood (routine x 2)     Status: None   Collection Time: 05/23/19  5:34 AM   Specimen: BLOOD  Result Value Ref Range Status   Specimen Description BLOOD LEFT ARM  Final   Special Requests   Final    BOTTLES DRAWN AEROBIC AND ANAEROBIC Blood Culture results may not be optimal due to an excessive volume of blood received in culture bottles   Culture   Final    NO GROWTH 5 DAYS Performed at [facility], [place] [address], [place], [place] [address]    Report Status 05/28/2019 FINAL  Final  Culture, blood (routine x 2)     Status: None   Collection Time: 05/23/19  5:42 AM   Specimen: BLOOD  Result Value Ref Range Status   Specimen Description BLOOD LEFT HAND   Final   Special Requests   Final    BOTTLES DRAWN AEROBIC AND ANAEROBIC Blood Culture adequate volume   Culture   Final    NO GROWTH 5 DAYS Performed at [name], [place] [address] [place],  [address]    Report Status 05/28/2019 FINAL  Final         Radiology Studies: DG Chest Port 1 View  Result Date: 06/01/2019 CLINICAL DATA:  Fever evaluation. EXAM: PORTABLE CHEST 1 VIEW COMPARISON:  Chest radiograph 05/21/2019 FINDINGS: A right-sided PICC is present with tip projecting in the region of the upper right atrium. Shallow inspiration radiograph with accentuation of the cardiac silhouette. Probable mild cardiomegaly, unchanged. Similar appearance of bibasilar opacities. Persistent small right pleural effusion. No evidence of pneumothorax. No acute bony abnormality IMPRESSION: Similar appearance of bibasilar opacities (greater on the right) which may reflect atelectasis or pneumonia. Persistent small right pleural effusion. Electronically Signed   By: Kellie Simmering DO   On: 06/01/2019 09:11        Scheduled Meds: . amLODipine  5 mg Oral Daily  . chlorhexidine  15 mL Mouth Rinse BID  . Chlorhexidine Gluconate Cloth  6 each Topical Daily  . enoxaparin (LOVENOX) injection  40 mg Subcutaneous Q24H  . escitalopram  10 mg Oral Daily  . gabapentin  300 mg Oral BID  . insulin aspart  0-15 Units Subcutaneous Q6H  . loperamide  2 mg Oral Q8H  . mouth rinse  15 mL Mouth  Rinse q12n4p  . Melatonin  3 mg Oral QHS  . metoprolol tartrate  50 mg Oral BID  . multivitamin with minerals  1 tablet Oral Daily  . pantoprazole  40 mg Oral Daily  . simethicone  80 mg Oral QID   Continuous Infusions: . TPN ADULT (ION) 75 mL/hr at 06/01/19 0800  . TPN ADULT (ION)    . vancomycin 1,250 mg (06/01/19 1218)     LOS: 10 days    Time spent: 35 minutes    Nicolette Bang, MD [facility]  If 7PM-7AM, please contact night-coverage  06/01/2019, 1:06 PM

## 2019-06-02 LAB — DIFFERENTIAL
Abs Immature Granulocytes: 0 10*3/uL (ref 0.00–0.07)
Basophils Absolute: 0 10*3/uL (ref 0.0–0.1)
Basophils Relative: 0 %
Eosinophils Absolute: 0.2 10*3/uL (ref 0.0–0.5)
Eosinophils Relative: 2 %
Lymphocytes Relative: 12 %
Lymphs Abs: 1.1 10*3/uL (ref 0.7–4.0)
Monocytes Absolute: 0.3 10*3/uL (ref 0.1–1.0)
Monocytes Relative: 3 %
Neutro Abs: 7.8 10*3/uL — ABNORMAL HIGH (ref 1.7–7.7)
Neutrophils Relative %: 83 %
nRBC: 0 /100 WBC

## 2019-06-02 LAB — COMPREHENSIVE METABOLIC PANEL
ALT: 9 U/L (ref 0–44)
AST: 10 U/L — ABNORMAL LOW (ref 15–41)
Albumin: 1.3 g/dL — ABNORMAL LOW (ref 3.5–5.0)
Alkaline Phosphatase: 104 U/L (ref 38–126)
Anion gap: 6 (ref 5–15)
BUN: 45 mg/dL — ABNORMAL HIGH (ref 6–20)
CO2: 19 mmol/L — ABNORMAL LOW (ref 22–32)
Calcium: 8.5 mg/dL — ABNORMAL LOW (ref 8.9–10.3)
Chloride: 105 mmol/L (ref 98–111)
Creatinine, Ser: 0.82 mg/dL (ref 0.44–1.00)
GFR calc Af Amer: >60 mL/min (ref >60)
GFR calc non Af Amer: >60 mL/min (ref >60)
Glucose, Bld: 185 mg/dL — ABNORMAL HIGH (ref 70–99)
Potassium: 5 mmol/L (ref 3.5–5.1)
Sodium: 130 mmol/L — ABNORMAL LOW (ref 135–145)
Total Bilirubin: 0.6 mg/dL (ref 0.3–1.2)
Total Protein: 6.1 g/dL — ABNORMAL LOW (ref 6.5–8.1)

## 2019-06-02 LAB — CBC
HCT: 28.3 % — ABNORMAL LOW (ref 36.0–46.0)
Hemoglobin: 8.9 g/dL — ABNORMAL LOW (ref 12.0–15.0)
MCH: 29.7 pg (ref 26.0–34.0)
MCHC: 31.4 g/dL (ref 30.0–36.0)
MCV: 94.3 fL (ref 80.0–100.0)
Platelets: 318 10*3/uL (ref 150–400)
RBC: 3 MIL/uL — ABNORMAL LOW (ref 3.87–5.11)
RDW: 16.1 % — ABNORMAL HIGH (ref 11.5–15.5)
WBC: 9.4 10*3/uL (ref 4.0–10.5)
nRBC: 0 % (ref 0.0–0.2)

## 2019-06-02 LAB — GLUCOSE, CAPILLARY
Glucose-Capillary: 197 mg/dL — ABNORMAL HIGH (ref 70–99)
Glucose-Capillary: 133 mg/dL — ABNORMAL HIGH (ref 70–99)
Glucose-Capillary: 111 mg/dL — ABNORMAL HIGH (ref 70–99)
Glucose-Capillary: 169 mg/dL — ABNORMAL HIGH (ref 70–99)
Glucose-Capillary: 108 mg/dL — ABNORMAL HIGH (ref 70–99)

## 2019-06-02 LAB — PHOSPHORUS: Phosphorus: 4.4 mg/dL (ref 2.5–4.6)

## 2019-06-02 LAB — MAGNESIUM: Magnesium: 2.1 mg/dL (ref 1.7–2.4)

## 2019-06-02 LAB — TRIGLYCERIDES: Triglycerides: 101 mg/dL (ref <150)

## 2019-06-02 LAB — PREALBUMIN: Prealbumin: 6 mg/dL — ABNORMAL LOW (ref 18–38)

## 2019-06-02 MED ORDER — TRAVASOL 10 % IV SOLN
INTRAVENOUS | Status: AC
  Filled 2019-06-02: qty 1080

## 2019-06-02 NOTE — Social Work (Signed)
CSW faxed PCS referral to [phone].   Criss Alvine, CSW

## 2019-06-02 NOTE — Progress Notes (Signed)
PROGRESS NOTE    Gina [name]  XHB:[id number] DOB: 01-23-1961 DOA: 05/22/2019 PCP: [name], MD   Brief Narrative: 59 year old female with DM, HTN, schizophrenia, bilateral BKA sent from [facility] due to positive blood culture. She was recently admitted  on November 2020 for perforated prepyloric ulcer with septic shock with acute peritonitis status post exlap with [name] patch on 11/23, and was discharged to [facility] on Abx and antifungal for total 6 weeks. Patient was discharged with Foley catheter, PICC line, bilateral lower quadrant drains along with enterocutaneous fistula with colostomy bag . She was discharged from [facility] to [facility] whiere she has been living for last 3 months. Patient was sent to ER for vague sleepiness, malaise ,anorexia low-grade fever and leukocytosis at SNF, and in the ER exam labs were  unremarkable and patient was sent back to SNF but overnight blood culture showed MRSA and she was readmitted to the hospital. Patient is being followed by infectious disease, general surgery.She did well with speech, diet was restarted.Foley catheter was exchanged. Repeat blood culture negative from 3/2. ID has advised to continue vancomycin through 3/25 and ordered PICC line.Surgery has been following patient and on calorie count,  Patient and family planning to return home with home health services upon discharge and has declined skilled nursing facility placement.  She has been started back on TPN.  She was febrile in the weekend.  Afebrile today.  General surgery following. Assessment & Plan:   Principal Problem:   MRSA bacteremia Active Problems:   HTN (hypertension)   Diabetes ([place])   Severe protein-calorie malnutrition ([place])   Anemia of chronic disease   Type 2 diabetes mellitus with hyperosmolar nonketotic hyperglycemia (HCC)   Pressure injury of right thigh, unstageable (HCC)   Below-knee amputee ([place])   Atherosclerosis of native arteries of  extremities with gangrene, right leg (HCC)   Intra-abdominal fluid collection   Palliative care by specialist   Goals of care, counseling/discussion   DNR (do not resuscitate)   Physical deconditioning   Dysphagia   Malnutrition of moderate degree  MSSA bacteremia: Indwelling PICC line used for TPN was felt to be the likely source, PICC line was removed.  Repeat blood cultures have been negative.  New PICC line placed on 3/18.  Plan is to continue vancomycin through 3/25.  ID was following Had febrile episodes on weekend.  Afebrile today.  New cultures have been sent, so far negative.  Chest x-ray did not show any pneumonia. If she becomes persistently febrile, will plan for CT abdomen.  Intra-abdominal fluid collection: Patient with history of exploratory laparotomy/partial small bowel resection for intra-abdominal abscess in November.  She has Eakins pouch and LLQ drain. She was admitted in November 2020 for perforated prepyloric ulcer with septic shock, acute peritonitis.  Status post exploratory laparotomy with [name] patch.  Moderate protein calorie malnutrition: Dietitian following.  Currently on TPN.Also on oral diet  Hypertension: Currently blood pressure well controlled.  On amlodipine  Diabetes type 2: Hemoglobin A1c of 5.2 as per  3/12.  Currently on sliding scale insulin.  Normocytic anemia: Has been transfused with several units of PRBC during the hospitalization.  Currently hemoglobin stable  GERD: Continue PPI  Mood disorder: Continue Celexa  Goals of care: Patient is DNR.  Palliative care following.  Recommended to follow-up as an outpatient.  Pressure Injury 02/03/19 Coccyx Right;Left;Medial Unstageable - Full thickness tissue loss in which the base of the ulcer is covered by slough (yellow, tan, gray, green  or brown) and/or eschar (tan, brown or black) in the wound bed. pt has 3 small injuri (Active)  02/03/19 0230  Location: Coccyx  Location Orientation:  Right;Left;Medial  Staging: Unstageable - Full thickness tissue loss in which the base of the ulcer is covered by slough (yellow, tan, gray, green or brown) and/or eschar (tan, brown or black) in the wound bed.  Wound Description (Comments): pt has 3 small injuries all in the same area, one is medial and two are on either side of it  Present on Admission: Yes     Pressure Injury 05/22/19 Coccyx Bilateral Stage 2 -  Partial thickness loss of dermis presenting as a shallow open injury with a red, pink wound bed without slough. (Active)  05/22/19 2104  Location: Coccyx  Location Orientation: Bilateral  Staging: Stage 2 -  Partial thickness loss of dermis presenting as a shallow open injury with a red, pink wound bed without slough.  Wound Description (Comments):   Present on Admission: Yes     Pressure Injury 05/22/19 Knee Right;Distal Unstageable - Full thickness tissue loss in which the base of the injury is covered by slough (yellow, tan, gray, green or brown) and/or eschar (tan, brown or black) in the wound bed. Stump (Active)  05/22/19 2106  Location: Knee  Location Orientation: Right;Distal  Staging: Unstageable - Full thickness tissue loss in which the base of the injury is covered by slough (yellow, tan, gray, green or brown) and/or eschar (tan, brown or black) in the wound bed.  Wound Description (Comments): Stump  Present on Admission: Yes     Pressure Injury 05/22/19 Knee Left;Distal Unstageable - Full thickness tissue loss in which the base of the injury is covered by slough (yellow, tan, gray, green or brown) and/or eschar (tan, brown or black) in the wound bed. Stump (Active)  05/22/19 2108  Location: Knee  Location Orientation: Left;Distal  Staging: Unstageable - Full thickness tissue loss in which the base of the injury is covered by slough (yellow, tan, gray, green or brown) and/or eschar (tan, brown or black) in the wound bed.  Wound Description (Comments): Stump  Present on  Admission: Yes     Pressure Injury 05/22/19 Thigh Left;Mid Stage 2 -  Partial thickness loss of dermis presenting as a shallow open injury with a red, pink wound bed without slough. (Active)  05/22/19 2109  Location: Thigh  Location Orientation: Left;Mid  Staging: Stage 2 -  Partial thickness loss of dermis presenting as a shallow open injury with a red, pink wound bed without slough.  Wound Description (Comments):   Present on Admission: Yes         Nutrition Problem: Moderate Malnutrition Etiology: chronic illness(EC fistula)      DVT prophylaxis:Lovenox Code Status: DNR Family Communication: None present at the bedside Disposition Plan: Patient is from skilled nursing facility.  PT/OT recommending skilled nursing facility.    Needs clearance from surgery before discharge.  Patient/family wants to go home with home health when ready.    Consultants: General surgery, ID  Procedures:As above  Antimicrobials:  Anti-infectives (From admission, onward)   Start     Dose/Rate Route Frequency Ordered Stop   06/01/19 1200  vancomycin (VANCOCIN) IVPB 1000 mg/200 mL premix  Status:  Discontinued     1,000 mg 200 mL/hr over 60 Minutes Intravenous Every 48 hours 05/31/19 2352 06/01/19 0813   06/01/19 1200  vancomycin (VANCOREADY) IVPB 1250 mg/250 mL     1,250 mg 166.7 mL/hr over  90 Minutes Intravenous Every 48 hours 06/01/19 0813 06/05/19 2359   05/31/19 1438  vancomycin variable dose per unstable renal function (pharmacist dosing)  Status:  Discontinued      Does not apply See admin instructions 05/31/19 1438 05/31/19 2351   05/25/19 2200  vancomycin (VANCOCIN) IVPB 1000 mg/200 mL premix  Status:  Discontinued     1,000 mg 200 mL/hr over 60 Minutes Intravenous Every 24 hours 05/25/19 0958 05/31/19 1438   05/24/19 2200  vancomycin (VANCOREADY) IVPB 750 mg/150 mL  Status:  Discontinued     750 mg 150 mL/hr over 60 Minutes Intravenous Every 24 hours 05/24/19 1156 05/25/19 0958    05/23/19 2200  vancomycin (VANCOCIN) IVPB 1000 mg/200 mL premix     1,000 mg 200 mL/hr over 60 Minutes Intravenous  Once 05/23/19 1345 05/24/19 0008   05/23/19 1113  vancomycin variable dose per unstable renal function (pharmacist dosing)  Status:  Discontinued      Does not apply See admin instructions 05/23/19 1114 05/24/19 1156   05/22/19 1500  vancomycin (VANCOREADY) IVPB 1250 mg/250 mL  Status:  Discontinued     1,250 mg 166.7 mL/hr over 90 Minutes Intravenous Every 24 hours 05/22/19 1429 05/23/19 1114      Subjective: Patient seen and examined the bedside this afternoon.  Hemodynamically stable.  Afebrile today.  Denies any nausea, vomiting or abdominal pain.  She was talking on the phone during my evaluation.  Objective: Vitals:   06/01/19 2107 06/02/19 0309 06/02/19 0545 06/02/19 0918  BP: 119/60  (!) 106/56 (!) 112/59  Pulse: 84  72 80  Resp: 18  18 17   Temp: 100.2 F (37.9 C) 98.7 F (37.1 C) (!) 97.4 F (36.3 C) 97.8 F (36.6 C)  TempSrc: Oral Oral Oral Oral  SpO2: 96%  94% 96%  Weight:      Height:        Intake/Output Summary (Last 24 hours) at 06/02/2019 1439 Last data filed at 06/02/2019 0919 Gross per 24 hour  Intake 1158.61 ml  Output 1675 ml  Net -516.39 ml   Filed Weights   05/28/19 0438 05/29/19 2128 05/30/19 2058  Weight: 53 kg 60 kg 61 kg    Examination:  General exam: Chronically ill looking, frail, cachectic, malnourished HEENT:PERRL,Oral mucosa moist, Ear/Nose normal on gross exam Respiratory system: Bilateral equal air entry, normal vesicular breath sounds, no wheezes or crackles  Cardiovascular system: S1 & S2 heard, RRR. No JVD, murmurs, rubs, gallops or clicks Gastrointestinal system: Abdomen is nondistended, soft and nontender.  Eakins pouch,Drain on LLQ Central nervous system: Alert and oriented. No focal neurological deficits. Extremities: BKA bilaterally with stump Skin: Minor skin breakdowns ,no icterus ,no pallor    Data  Reviewed: I have personally reviewed following labs and imaging studies  CBC: Recent Labs  Lab 05/28/19 0152 05/29/19 1003 05/30/19 0903 05/31/19 1020 06/02/19 0415  WBC 11.5* 8.2 12.1* 9.6 9.4  NEUTROABS  --  5.5  --  6.1 7.8*  HGB 7.1* 6.7* 10.2* 9.9* 8.9*  HCT 22.8* 21.6* 31.7* 30.6* 28.3*  MCV 98.3 96.4 91.1 91.3 94.3  PLT 390 397 393 367 852   Basic Metabolic Panel: Recent Labs  Lab 05/29/19 1003 05/30/19 0407 05/31/19 1020 06/01/19 0317 06/02/19 0415  NA 131* 133* 131* 132* 130*  K 4.3 4.4 4.8 4.6 5.0  CL 106 108 106 108 105  CO2 18* 19* 20* 19* 19*  GLUCOSE 93 152* 178* 151* 185*  BUN 21* 18 28* 32*  45*  CREATININE 0.97 0.82 0.83 0.63 0.82  CALCIUM 8.1* 8.3* 8.2* 8.2* 8.5*  MG 1.7 1.7 1.9 1.8 2.1  PHOS 4.3 3.9 3.3 3.8 4.4   GFR: Estimated Creatinine Clearance: 64.3 mL/min (by C-G formula based on SCr of 0.82 mg/dL). Liver Function Tests: Recent Labs  Lab 05/27/19 0429 05/28/19 0152 05/29/19 1003 05/30/19 0407 06/02/19 0415  AST 18 15 14* 12* 10*  ALT 21 17 15 14 9   ALKPHOS 135* 137* 128* 123 104  BILITOT 0.5 0.8 0.8 0.7 0.6  PROT 6.4* 6.7 6.5 6.6 6.1*  ALBUMIN 1.5* 1.5* 1.5* 1.5* 1.3*   No results for input(s): LIPASE, AMYLASE in the last 168 hours. No results for input(s): AMMONIA in the last 168 hours. Coagulation Profile: No results for input(s): INR, PROTIME in the last 168 hours. Cardiac Enzymes: No results for input(s): CKTOTAL, CKMB, CKMBINDEX, TROPONINI in the last 168 hours. BNP (last 3 results) No results for input(s): PROBNP in the last 8760 hours. HbA1C: No results for input(s): HGBA1C in the last 72 hours. CBG: Recent Labs  Lab 06/01/19 1129 06/01/19 1619 06/02/19 0310 06/02/19 0653 06/02/19 1105  GLUCAP 244* 138* 197* 133* 111*   Lipid Profile: Recent Labs    06/02/19 0415  TRIG 101   Thyroid Function Tests: No results for input(s): TSH, T4TOTAL, FREET4, T3FREE, THYROIDAB in the last 72 hours. Anemia Panel: No  results for input(s): VITAMINB12, FOLATE, FERRITIN, TIBC, IRON, RETICCTPCT in the last 72 hours. Sepsis Labs: No results for input(s): PROCALCITON, LATICACIDVEN in the last 168 hours.  Recent Results (from the past 240 hour(s))  Culture, blood (routine x 2)     Status: None (Preliminary result)   Collection Time: 06/01/19  9:13 AM   Specimen: BLOOD RIGHT HAND  Result Value Ref Range Status   Specimen Description BLOOD RIGHT HAND  Final   Special Requests   Final    BOTTLES DRAWN AEROBIC ONLY Blood Culture adequate volume   Culture   Final    NO GROWTH 1 DAY Performed at [facility], [address], [place], [place] [address]    Report Status PENDING  Incomplete  Culture, blood (routine x 2)     Status: None (Preliminary result)   Collection Time: 06/01/19  9:13 AM   Specimen: BLOOD  Result Value Ref Range Status   Specimen Description BLOOD LEFT ANTECUBITAL  Final   Special Requests   Final    BOTTLES DRAWN AEROBIC ONLY Blood Culture results may not be optimal due to an inadequate volume of blood received in culture bottles   Culture   Final    NO GROWTH 1 DAY Performed at [facility], [place] [address] [place], [place] [address]    Report Status PENDING  Incomplete         Radiology Studies: DG Chest Port 1 View  Result Date: 06/01/2019 CLINICAL DATA:  Fever evaluation. EXAM: PORTABLE CHEST 1 VIEW COMPARISON:  Chest radiograph 05/21/2019 FINDINGS: A right-sided PICC is present with tip projecting in the region of the upper right atrium. Shallow inspiration radiograph with accentuation of the cardiac silhouette. Probable mild cardiomegaly, unchanged. Similar appearance of bibasilar opacities. Persistent small right pleural effusion. No evidence of pneumothorax. No acute bony abnormality IMPRESSION: Similar appearance of bibasilar opacities (greater on the right) which may reflect atelectasis or pneumonia. Persistent small right pleural effusion. Electronically Signed    By: Kellie Simmering DO   On: 06/01/2019 09:11  Scheduled Meds: . amLODipine  5 mg Oral Daily  . chlorhexidine  15 mL Mouth Rinse BID  . Chlorhexidine Gluconate Cloth  6 each Topical Daily  . enoxaparin (LOVENOX) injection  40 mg Subcutaneous Q24H  . escitalopram  10 mg Oral Daily  . gabapentin  300 mg Oral BID  . insulin aspart  0-15 Units Subcutaneous Q6H  . loperamide  2 mg Oral Q8H  . mouth rinse  15 mL Mouth Rinse q12n4p  . Melatonin  3 mg Oral QHS  . metoprolol tartrate  50 mg Oral BID  . multivitamin with minerals  1 tablet Oral Daily  . pantoprazole  40 mg Oral Daily  . simethicone  80 mg Oral QID   Continuous Infusions: . TPN ADULT (ION) 75 mL/hr at 06/02/19 0800  . TPN ADULT (ION)    . vancomycin 1,250 mg (06/01/19 1218)     LOS: 11 days    Time spent: 25 mins, More than 50% of that time was spent in counseling and/or coordination of care.      Shelly Coss, MD [facility] P3/22/2021, 2:39 PM

## 2019-06-02 NOTE — Plan of Care (Signed)
  Problem: Coping: Goal: Level of anxiety will decrease Outcome: Progressing   Problem: Pain Managment: Goal: General experience of comfort will improve Outcome: Progressing   

## 2019-06-02 NOTE — Progress Notes (Signed)
PT Cancellation Note  Patient Details Name: Anyeli Hockenbury MRN: [id number] DOB: 16-Sep-1960   Cancelled Treatment:    Reason Eval/Treat Not Completed: Pain limiting ability to participate - just had dressing changes, will check back as schedule allows.   [place] Pager [phone]  Office [phone]    [place] 06/02/2019, 4:32 PM

## 2019-06-02 NOTE — Progress Notes (Signed)
PHARMACY - TOTAL PARENTERAL NUTRITION CONSULT NOTE   Indication: Fistula  Patient Measurements: Height: 5\' 2"  (157.5 cm) Weight: 134 lb 7.7 oz (61 kg) IBW/kg (Calculated) : 50.1 TPN AdjBW (KG): 54.6 Body mass index is 24.6 kg/m.  Assessment:  88 YOF with hx of partial small bowel resection/EC fistula on chronic TPN since beginning of year.  Significant history of DM with bilateral BKA.  Presenting from SNF with MRSA bacteremia, suspected source from PICC.  Has been on oral diet this admission with line holiday however decreased appetite and may not be absorbing much per RD, unable to meet requirements.  Moderate malnutrition of chronic disease per RD assessment.     Glucose / Insulin: History of DM and requirement of insulin in past TPNs. Previously this admission on D10 to maintain euglycemia before TPN start. Current CBGs controlled on moderate SSI at 133-244, required 13 units of Novolog insulin in last 24 hours.  Electrolytes: Na low at 130, K 5, CO2 19, Mag 2.1, Phos 4.4, and CoCa 10.2. Renal: SCr 0.83>>0.63>>0.82 - stable. BUN up 32>>45.  LFTs / TGs: LFTs/Tbili within normal limits. TG 181>101.  Prealbumin / albumin: Prealbumin 7>>6/ Albumin 1.5>>1.3 Intake / Output; MIVF: UOP 0.8 mL/kg/hr. Colostomy output 550 mL. JP drain 0 mL. GI Imaging: none since start of TPN Surgeries / Procedures: none since start of TPN  Central access: PICC replaced 05/29/19 TPN start date: 05/29/19  Nutritional Goals (per RD recommendation on 3/18): kCal: 1800-2000, Protein: 83-110g, Fluid: >= 1.9L Goal TPN rate is 75 mL/hr (provides 108 g of protein and 1819 kcals per day)  Current Nutrition:  Soft diet - Dysphagia 2 --intake 15% of one meal yesterday and no supplements charted   Plan:  Continue custom TPN to goal rate of 75 mL/hr at 1800. Electrolytes in TPN: 50 mEq/L of Na, decrease to 20 mEq/L of K (36 mEq/day), 2.5 mEq/L of Ca, 6 mEq/L of Mg, and 12 mmol/L of Phos. Maximize Acetate. Patient  receiving oral MVI which contains trace - will not add to TPN.  Continue  Moderate q6h SSI and adjust as needed - patient only required 13 units of insulin over last 24 hours, will add 10 units to tonight's TPN  Monitor TPN labs on Mon/Thurs, and daily with start of TPN.   Monitor po intake and plan for TPN long-term/discharge.  If CBGs remain controlled, consider transition to cyclic TPN pending long-term plans.   Alanda Slim, PharmD, [facility] Clinical Pharmacist Please see AMION for all Pharmacists' Contact Phone Numbers 06/02/2019, 7:39 AM

## 2019-06-02 NOTE — Progress Notes (Signed)
[facility] Progress Note  6 Days Post-Op  Subjective: CC-  Tired this morning. Really wants to go home. Denies abdominal pain, nausea, vomiting. Not eating much. ECF with about 650cc out last 24 hours. Spiked fever over the weekend, TMAX 100.2 last 24 hours. U/a blood cultures pending. CXR unchanged with atelectasis vs pneumonia and small right pleural effusion.  Objective: Vital signs in last 24 hours: Temp:  [97.4 F (36.3 C)-100.2 F (37.9 C)] 97.8 F (36.6 C) (03/22 0918) Pulse Rate:  [72-84] 80 (03/22 0918) Resp:  [16-18] 17 (03/22 0918) BP: (106-119)/(56-63) 112/59 (03/22 0918) SpO2:  [94 %-98 %] 96 % (03/22 0918) Last BM Date: 06/02/19  Intake/Output from previous day: 03/21 0701 - 03/22 0700 In: 1332.8 [P.O.:360; I.V.:972.8] Out: 2075 [Urine:1425; Stool:650] Intake/Output this shift: Total I/O In: 445.2 [I.V.:445.2] Out: -   PE: Gen: Alert, NAD Pulm: rate and effort normal HAL:PFXT, nontender, nondistended, Eakins in place over fistula with GI contents in bag, IR drainalso with small amount of enteric contents, previous drain site RUQ cdi with no active drainage or erythema Ext:S/p BKA Skin: warm and dry  Lab Results:  Recent Labs    05/31/19 1020 06/02/19 0415  WBC 9.6 9.4  HGB 9.9* 8.9*  HCT 30.6* 28.3*  PLT 367 318   BMET Recent Labs    06/01/19 0317 06/02/19 0415  NA 132* 130*  K 4.6 5.0  CL 108 105  CO2 19* 19*  GLUCOSE 151* 185*  BUN 32* 45*  CREATININE 0.63 0.82  CALCIUM 8.2* 8.5*   PT/INR No results for input(s): LABPROT, INR in the last 72 hours. CMP     Component Value Date/Time   NA 130 (L) 06/02/2019 0415   NA 139 10/04/2017 1705   K 5.0 06/02/2019 0415   CL 105 06/02/2019 0415   CO2 19 (L) 06/02/2019 0415   GLUCOSE 185 (H) 06/02/2019 0415   BUN 45 (H) 06/02/2019 0415   BUN 11 10/04/2017 1705   CREATININE 0.82 06/02/2019 0415   CREATININE 0.68 04/28/2016 1640   CALCIUM 8.5 (L) 06/02/2019 0415   PROT  6.1 (L) 06/02/2019 0415   PROT 7.1 10/04/2017 1705   ALBUMIN 1.3 (L) 06/02/2019 0415   ALBUMIN 3.7 10/04/2017 1705   AST 10 (L) 06/02/2019 0415   ALT 9 06/02/2019 0415   ALKPHOS 104 06/02/2019 0415   BILITOT 0.6 06/02/2019 0415   BILITOT <0.2 10/04/2017 1705   GFRNONAA >60 06/02/2019 0415   GFRNONAA >89 04/28/2016 1640   GFRAA >60 06/02/2019 0415   GFRAA >89 04/28/2016 1640   Lipase     Component Value Date/Time   LIPASE 27 04/01/2016 1040       Studies/Results: DG Chest Port 1 View  Result Date: 06/01/2019 CLINICAL DATA:  Fever evaluation. EXAM: PORTABLE CHEST 1 VIEW COMPARISON:  Chest radiograph 05/21/2019 FINDINGS: A right-sided PICC is present with tip projecting in the region of the upper right atrium. Shallow inspiration radiograph with accentuation of the cardiac silhouette. Probable mild cardiomegaly, unchanged. Similar appearance of bibasilar opacities. Persistent small right pleural effusion. No evidence of pneumothorax. No acute bony abnormality IMPRESSION: Similar appearance of bibasilar opacities (greater on the right) which may reflect atelectasis or pneumonia. Persistent small right pleural effusion. Electronically Signed   By: Kellie Simmering DO   On: 06/01/2019 09:11    Anti-infectives: Anti-infectives (From admission, onward)   Start     Dose/Rate Route Frequency Ordered Stop   06/01/19 1200  vancomycin (VANCOCIN) IVPB 1000  mg/200 mL premix  Status:  Discontinued     1,000 mg 200 mL/hr over 60 Minutes Intravenous Every 48 hours 05/31/19 2352 06/01/19 0813   06/01/19 1200  vancomycin (VANCOREADY) IVPB 1250 mg/250 mL     1,250 mg 166.7 mL/hr over 90 Minutes Intravenous Every 48 hours 06/01/19 0813 06/05/19 2359   05/31/19 1438  vancomycin variable dose per unstable renal function (pharmacist dosing)  Status:  Discontinued      Does not apply See admin instructions 05/31/19 1438 05/31/19 2351   05/25/19 2200  vancomycin (VANCOCIN) IVPB 1000 mg/200 mL premix   Status:  Discontinued     1,000 mg 200 mL/hr over 60 Minutes Intravenous Every 24 hours 05/25/19 0958 05/31/19 1438   05/24/19 2200  vancomycin (VANCOREADY) IVPB 750 mg/150 mL  Status:  Discontinued     750 mg 150 mL/hr over 60 Minutes Intravenous Every 24 hours 05/24/19 1156 05/25/19 0958   05/23/19 2200  vancomycin (VANCOCIN) IVPB 1000 mg/200 mL premix     1,000 mg 200 mL/hr over 60 Minutes Intravenous  Once 05/23/19 1345 05/24/19 0008   05/23/19 1113  vancomycin variable dose per unstable renal function (pharmacist dosing)  Status:  Discontinued      Does not apply See admin instructions 05/23/19 1114 05/24/19 1156   05/22/19 1500  vancomycin (VANCOREADY) IVPB 1250 mg/250 mL  Status:  Discontinued     1,250 mg 166.7 mL/hr over 90 Minutes Intravenous Every 24 hours 05/22/19 1429 05/23/19 1114       Assessment/Plan HTN DM Schizophrenia Stress urinary incontinence Bilateral BKAs Left middle finger amputation Severe malnutrition - prealbumin6 (3/22) Code status DNR  MRSA bacteremia - per primary, PICC line out, on vancomycin through 3/25 per ID  Perforated prepyloric ulcer S/pEXPLORATORY LAPAROTOMY WITH OVERSEW OF DUODENAL ULCER with [name] patch11/23/2020 Dr. Lucia Gaskins [facility] Fistula -JP surgical drainfell out3/14/21and 1 IR drain placed 12/16/20in place with enteric contents  - UGI3/12showed2 separate enterocutaneous fistulae - may need drain injection studies to further characterize fistulas in the future, as well as EGD - continue strict I&O's of output from ECF and drain.   ID - vancomycin 3/11>> FEN - Dysphagia 2 diet, TPN VTE -SCDs, lovenox Foley -in place Follow up - TBD  Plan: Primary team is working towards discharge home with daughter but this is not easy given several needs including PICC, TPN, IV antibiotics, Eakins pouch and drain. Case management following.  Patient also undergoing new fever workup. Continue to encourage PO intake and monitor ECF  output, currently stable on imodium 2mg  q8 hours.   LOS: 11 days    [facility] 06/02/2019, 11:00 AM Please see Amion for pager number during day hours 7:00am-4:30pm

## 2019-06-03 LAB — BASIC METABOLIC PANEL
Anion gap: 5 (ref 5–15)
BUN: 57 mg/dL — ABNORMAL HIGH (ref 6–20)
CO2: 21 mmol/L — ABNORMAL LOW (ref 22–32)
Calcium: 8.5 mg/dL — ABNORMAL LOW (ref 8.9–10.3)
Chloride: 105 mmol/L (ref 98–111)
Creatinine, Ser: 0.88 mg/dL (ref 0.44–1.00)
GFR calc Af Amer: >60 mL/min (ref >60)
GFR calc non Af Amer: >60 mL/min (ref >60)
Glucose, Bld: 90 mg/dL (ref 70–99)
Potassium: 4.9 mmol/L (ref 3.5–5.1)
Sodium: 131 mmol/L — ABNORMAL LOW (ref 135–145)

## 2019-06-03 LAB — GLUCOSE, CAPILLARY
Glucose-Capillary: 104 mg/dL — ABNORMAL HIGH (ref 70–99)
Glucose-Capillary: 112 mg/dL — ABNORMAL HIGH (ref 70–99)
Glucose-Capillary: 83 mg/dL (ref 70–99)
Glucose-Capillary: 85 mg/dL (ref 70–99)
Glucose-Capillary: 86 mg/dL (ref 70–99)

## 2019-06-03 LAB — MAGNESIUM: Magnesium: 2.1 mg/dL (ref 1.7–2.4)

## 2019-06-03 LAB — PHOSPHORUS: Phosphorus: 4.5 mg/dL (ref 2.5–4.6)

## 2019-06-03 MED ORDER — TRAVASOL 10 % IV SOLN
INTRAVENOUS | Status: AC
  Filled 2019-06-03: qty 1080

## 2019-06-03 NOTE — TOC Progression Note (Addendum)
Transition of Care [facility]) - Progression Note    Patient Details  Name: Gina [name] MRN: [id number] Date of Birth: Dec 16, 1960  Transition of [facility]) CM/SW Contact  Sharlet Salina Mila Homer, LCSW Phone Number: 06/03/2019, 3:07 PM  Clinical Narrative:  Working on Publishing rights manager (PCS) and Home Health services for patient. Daughter Yadira Hada contacted [phone]) regarding these services and is interested in [place] for the [facility] services.  [facility], [facility] and [facility] unable to provide [facility] (RN, PT, OT services. PCS paperwork completed and faxed to [facility]. Received a call from [place], [place] with [facility]. Her questions were answered and the preferred companies for [facility] services are in order: [facility], [facility], [facility].   Call made to companies for [facility] services: [facility] declined. [facility] is out-of-network with patient's insurance. [facility], [facility], [facility], [facility], [facility] and [facility] can't staff. Waiting to hear from [place] and [facility].      Expected Discharge Plan: Skilled Nursing Facility(Definite plan not decided yet by patient/adult children. It is between returning to SNF or going home with services (possibly PCS/HH)) Barriers to Discharge: Continued Medical Work up  Expected Discharge Plan and Services Expected Discharge Plan: Skilled Nursing Facility(Definite plan not decided yet by patient/adult children. It is between returning to SNF or going home with services (possibly PCS/HH))                                               Social Determinants of Health (SDOH) Interventions    Readmission Risk Interventions Readmission Risk Prevention Plan 02/10/2019 11/07/2017  Post Dischage Appt - Patient refused  Medication Screening - Complete  Transportation Screening Complete Complete  PCP follow-up - Patient refused  PCP or Specialist Appt within 3-5 Days Not Complete -  Not Complete  comments DC date unknown- pt established with PCP -  [place] or [place] Complete -  Social Work Consult for [place] Planning/Counseling Not Complete -  SW consult not completed comments awaiting call back from daughter and APS -  Palliative Care Screening Not Complete -  Palliative Care Screening Not Complete Comments pending need -  Medication Review ([place]) Referral to Pharmacy -  Some recent data might be hidden

## 2019-06-03 NOTE — Progress Notes (Signed)
[facility] Progress Note  7 Days Post-Op  Subjective: CC-  Alert today. States her son and daughter visited yesterday which made her happy. Denies abdominal pain, nausea, vomiting. Not eating much. ECF output not documented in epic last 24 hours.  Afebrile after fevers over the weekend. blood cultures NGTD x48h. CXR unchanged with atelectasis vs pneumonia and small right pleural effusion. UA not collected.  Objective: Vital signs in last 24 hours: Temp:  [98 F (36.7 C)-98.7 F (37.1 C)] 98.7 F (37.1 C) (03/23 0510) Pulse Rate:  [72-80] 72 (03/23 0510) Resp:  [16] 16 (03/23 0510) BP: (108-120)/(51-61) 108/51 (03/23 0510) SpO2:  [94 %-97 %] 97 % (03/23 0510) Weight:  [55 kg] 55 kg (03/22 2124) Last BM Date: 06/02/19  Intake/Output from previous day: 03/22 0701 - 03/23 0700 In: 1919.2 [P.O.:300; I.V.:1369.2; IV Piggyback:250] Out: 5400 [Urine:3750] Intake/Output this shift: No intake/output data recorded.  PE: Gen: Alert, NAD Pulm: rate and effort normal QQP:YPPJ, nontender, nondistended, Eakins in place over fistula with GI contents in bag, IR drainalso with small amount of enteric contents, previous drain site RUQ cdi with no active drainage or erythema Ext:S/p BKA Skin: warm and dry  Lab Results:  Recent Labs    05/31/19 1020 06/02/19 0415  WBC 9.6 9.4  HGB 9.9* 8.9*  HCT 30.6* 28.3*  PLT 367 318   BMET Recent Labs    06/02/19 0415 06/03/19 0412  NA 130* 131*  K 5.0 4.9  CL 105 105  CO2 19* 21*  GLUCOSE 185* 90  BUN 45* 57*  CREATININE 0.82 0.88  CALCIUM 8.5* 8.5*   PT/INR No results for input(s): LABPROT, INR in the last 72 hours. CMP     Component Value Date/Time   NA 131 (L) 06/03/2019 0412   NA 139 10/04/2017 1705   K 4.9 06/03/2019 0412   CL 105 06/03/2019 0412   CO2 21 (L) 06/03/2019 0412   GLUCOSE 90 06/03/2019 0412   BUN 57 (H) 06/03/2019 0412   BUN 11 10/04/2017 1705   CREATININE 0.88 06/03/2019 0412   CREATININE 0.68 04/28/2016 1640   CALCIUM 8.5 (L) 06/03/2019 0412   PROT 6.1 (L) 06/02/2019 0415   PROT 7.1 10/04/2017 1705   ALBUMIN 1.3 (L) 06/02/2019 0415   ALBUMIN 3.7 10/04/2017 1705   AST 10 (L) 06/02/2019 0415   ALT 9 06/02/2019 0415   ALKPHOS 104 06/02/2019 0415   BILITOT 0.6 06/02/2019 0415   BILITOT <0.2 10/04/2017 1705   GFRNONAA >60 06/03/2019 0412   GFRNONAA >89 04/28/2016 1640   GFRAA >60 06/03/2019 0412   GFRAA >89 04/28/2016 1640   Lipase     Component Value Date/Time   LIPASE 27 04/01/2016 1040       Studies/Results: No results found.  Anti-infectives: Anti-infectives (From admission, onward)   Start     Dose/Rate Route Frequency Ordered Stop   06/01/19 1200  vancomycin (VANCOCIN) IVPB 1000 mg/200 mL premix  Status:  Discontinued     1,000 mg 200 mL/hr over 60 Minutes Intravenous Every 48 hours 05/31/19 2352 06/01/19 0813   06/01/19 1200  vancomycin (VANCOREADY) IVPB 1250 mg/250 mL     1,250 mg 166.7 mL/hr over 90 Minutes Intravenous Every 48 hours 06/01/19 0813 06/05/19 2359   05/31/19 1438  vancomycin variable dose per unstable renal function (pharmacist dosing)  Status:  Discontinued      Does not apply See admin instructions 05/31/19 1438 05/31/19 2351   05/25/19 2200  vancomycin (VANCOCIN) IVPB  1000 mg/200 mL premix  Status:  Discontinued     1,000 mg 200 mL/hr over 60 Minutes Intravenous Every 24 hours 05/25/19 0958 05/31/19 1438   05/24/19 2200  vancomycin (VANCOREADY) IVPB 750 mg/150 mL  Status:  Discontinued     750 mg 150 mL/hr over 60 Minutes Intravenous Every 24 hours 05/24/19 1156 05/25/19 0958   05/23/19 2200  vancomycin (VANCOCIN) IVPB 1000 mg/200 mL premix     1,000 mg 200 mL/hr over 60 Minutes Intravenous  Once 05/23/19 1345 05/24/19 0008   05/23/19 1113  vancomycin variable dose per unstable renal function (pharmacist dosing)  Status:  Discontinued      Does not apply See admin instructions 05/23/19 1114 05/24/19 1156   05/22/19  1500  vancomycin (VANCOREADY) IVPB 1250 mg/250 mL  Status:  Discontinued     1,250 mg 166.7 mL/hr over 90 Minutes Intravenous Every 24 hours 05/22/19 1429 05/23/19 1114       Assessment/Plan HTN DM Schizophrenia Stress urinary incontinence Bilateral BKAs Left middle finger amputation Severe malnutrition - prealbumin6 (3/22) Code status DNR  MRSA bacteremia - per primary, PICC line out, on vancomycin through 3/25 per ID  Perforated prepyloric ulcer S/pEXPLORATORY LAPAROTOMY WITH OVERSEW OF DUODENAL ULCER with [name] patch11/23/2020 Dr. Lucia Gaskins [facility] Fistula -JP surgical drainfell out3/14/21and 1 IR drain placed 12/16/20in place with enteric contents  - UGI3/12showed2 separate enterocutaneous fistulae - may need drain injection studies to further characterize fistulas in the future, as well as EGD - continue strict I&O's of output from ECF and drain.   ID - vancomycin 3/11>> (stop date 3/25) FEN - Dysphagia 2 diet, TPN VTE -SCDs, lovenox Foley -in place Follow up - TBD  Plan: Primary team is working towards discharge home with daughter but this is not easy given several needs including PICC, TPN, IV antibiotics, Eakins pouch and drain. Case management following.  Continue to encourage PO intake and monitor ECF output, currently stable on imodium 2mg  q8 hours.   LOS: 12 days   [facility] 06/03/2019, 9:43 AM Please see Amion for pager number during day hours 7:00am-4:30pm

## 2019-06-03 NOTE — Progress Notes (Signed)
PT Cancellation Note  Patient Details Name: Gina [name] MRN: [id number] DOB: 1960-08-17   Cancelled Treatment:    Reason Eval/Treat Not Completed: Other (comment) Pt refused PT , states "I already did that today."  Discussed she had OT who worked on UE exercises, encouraged to participate with PT and work on bed mobility and sitting.  Tried to encourage and educate on importance of maintaining mobility for general health and to ease caregiver burden.  Pt continued to decline.  Will f/u as able.  Maggie Font, PT Acute Rehab Services Pager [phone] [facility] Re[facility] [phone]   Karlton Lemon 06/03/2019, 3:11 PM

## 2019-06-03 NOTE — Progress Notes (Signed)
PROGRESS NOTE    Gina [name]  JJO:[id number] DOB: [birth year] DOA: 05/22/2019 PCP: [name], MD   Brief Narrative: 59 year old female with DM, HTN, schizophrenia, bilateral BKA sent from [facility] due to positive blood culture. She was recently admitted  on November 2020 for perforated prepyloric ulcer with septic shock with acute peritonitis status post exlap with [name] patch on 11/23, and was discharged to select LTAC on Abx and antifungal for total 6 weeks. Patient was discharged with Foley catheter, PICC line, bilateral lower quadrant drains along with enterocutaneous fistula with colostomy bag . She was discharged from [place] to [facility] whiere she has been living for last 3 months. Patient was sent to ER for vague sleepiness, malaise ,anorexia low-grade fever and leukocytosis at SNF, and in the ER exam labs were  unremarkable and patient was sent back to SNF but overnight blood culture showed MRSA and she was readmitted to the hospital. Patient is being followed by infectious disease, general surgery.She did well with speech, diet was restarted.Foley catheter was exchanged. Repeat blood culture negative from 3/2. ID has advised to continue vancomycin through 3/25 and ordered PICC line.Surgery has been following patient and on calorie count,  Patient and family planning to return home with home health services upon discharge and has declined skilled nursing facility placement.  She has been started back on TPN.  She was febrile last weekend.  Afebrile since last 2 days.  General surgery following. Discharge planning to home  with home health after surgical clearance.  Assessment & Plan:   Principal Problem:   MRSA bacteremia Active Problems:   HTN (hypertension)   Diabetes ([place])   Severe protein-calorie malnutrition ([place])   Anemia of chronic disease   Type 2 diabetes mellitus with hyperosmolar nonketotic hyperglycemia (HCC)   Pressure injury of right thigh,  unstageable (HCC)   Below-knee amputee ([place])   Atherosclerosis of native arteries of extremities with gangrene, right leg (HCC)   Intra-abdominal fluid collection   Palliative care by specialist   Goals of care, counseling/discussion   DNR (do not resuscitate)   Physical deconditioning   Dysphagia   Malnutrition of moderate degree  MSSA bacteremia: Indwelling PICC line used for TPN was felt to be the likely source, PICC line was removed.  Repeat blood cultures have been negative.  New PICC line placed on 3/18.  Plan is to continue vancomycin through 3/25.  ID was following Had febrile episodes on weekend.  Afebrile for last 24 hours.  New cultures have been sent, so far negative.  Chest x-ray did not show any pneumonia. If she becomes persistently febrile, will plan for CT abdomen.  Intra-abdominal fluid collection: Patient with history of exploratory laparotomy/partial small bowel resection for intra-abdominal abscess in November.  She has Eakins pouch and LLQ drain. She was admitted in November 2020 for perforated prepyloric ulcer with septic shock, acute peritonitis.  Status post exploratory laparotomy with [name] patch. She had 2 separate enterocutaneous fistula.  She might need injection studies to further characterize fistulas in the future as well as EGD.  Moderate protein calorie malnutrition: Dietitian following.  Currently on TPN.Also on oral diet.  Currently having loose stools.  On Imodium  Hypertension: Currently blood pressure well controlled.  On amlodipine  Diabetes type 2: Hemoglobin A1c of 5.2 as per  3/12.  Currently on sliding scale insulin.  Normocytic anemia: Has been transfused with several units of PRBC during the hospitalization.  Currently hemoglobin stable  GERD: Continue PPI  Mood  disorder: Continue Celexa  Goals of care: Patient is DNR.  Palliative care was following.  Recommended to follow-up as an outpatient.  Pressure Injury 02/03/19 Coccyx  Right;Left;Medial Unstageable - Full thickness tissue loss in which the base of the ulcer is covered by slough (yellow, tan, gray, green or brown) and/or eschar (tan, brown or black) in the wound bed. pt has 3 small injuri (Active)  02/03/19 0230  Location: Coccyx  Location Orientation: Right;Left;Medial  Staging: Unstageable - Full thickness tissue loss in which the base of the ulcer is covered by slough (yellow, tan, gray, green or brown) and/or eschar (tan, brown or black) in the wound bed.  Wound Description (Comments): pt has 3 small injuries all in the same area, one is medial and two are on either side of it  Present on Admission: Yes     Pressure Injury 05/22/19 Coccyx Bilateral Stage 2 -  Partial thickness loss of dermis presenting as a shallow open injury with a red, pink wound bed without slough. (Active)  05/22/19 2104  Location: Coccyx  Location Orientation: Bilateral  Staging: Stage 2 -  Partial thickness loss of dermis presenting as a shallow open injury with a red, pink wound bed without slough.  Wound Description (Comments):   Present on Admission: Yes     Pressure Injury 05/22/19 Knee Right;Distal Unstageable - Full thickness tissue loss in which the base of the injury is covered by slough (yellow, tan, gray, green or brown) and/or eschar (tan, brown or black) in the wound bed. Stump (Active)  05/22/19 2106  Location: Knee  Location Orientation: Right;Distal  Staging: Unstageable - Full thickness tissue loss in which the base of the injury is covered by slough (yellow, tan, gray, green or brown) and/or eschar (tan, brown or black) in the wound bed.  Wound Description (Comments): Stump  Present on Admission: Yes     Pressure Injury 05/22/19 Knee Left;Distal Unstageable - Full thickness tissue loss in which the base of the injury is covered by slough (yellow, tan, gray, green or brown) and/or eschar (tan, brown or black) in the wound bed. Stump (Active)  05/22/19 2108    Location: Knee  Location Orientation: Left;Distal  Staging: Unstageable - Full thickness tissue loss in which the base of the injury is covered by slough (yellow, tan, gray, green or brown) and/or eschar (tan, brown or black) in the wound bed.  Wound Description (Comments): Stump  Present on Admission: Yes     Pressure Injury 05/22/19 Thigh Left;Mid Stage 2 -  Partial thickness loss of dermis presenting as a shallow open injury with a red, pink wound bed without slough. (Active)  05/22/19 2109  Location: Thigh  Location Orientation: Left;Mid  Staging: Stage 2 -  Partial thickness loss of dermis presenting as a shallow open injury with a red, pink wound bed without slough.  Wound Description (Comments):   Present on Admission: Yes         Nutrition Problem: Moderate Malnutrition Etiology: chronic illness(EC fistula)      DVT prophylaxis:Lovenox Code Status: DNR Family Communication: Called daughter and discussed on the phone on 06/03/2019 Disposition Plan: Patient is from skilled nursing facility.  PT/OT recommending skilled nursing facility.   Patient and her daughter deny skilled nursing facility and want to go home with home health.  She is medically stable for discharge from medical perspective.  Waiting for surgical clearance.   Consultants: General surgery, ID  Procedures:As above  Antimicrobials:  Anti-infectives (From admission, onward)  Start     Dose/Rate Route Frequency Ordered Stop   06/01/19 1200  vancomycin (VANCOCIN) IVPB 1000 mg/200 mL premix  Status:  Discontinued     1,000 mg 200 mL/hr over 60 Minutes Intravenous Every 48 hours 05/31/19 2352 06/01/19 0813   06/01/19 1200  vancomycin (VANCOREADY) IVPB 1250 mg/250 mL     1,250 mg 166.7 mL/hr over 90 Minutes Intravenous Every 48 hours 06/01/19 0813 06/05/19 2359   05/31/19 1438  vancomycin variable dose per unstable renal function (pharmacist dosing)  Status:  Discontinued      Does not apply See admin  instructions 05/31/19 1438 05/31/19 2351   05/25/19 2200  vancomycin (VANCOCIN) IVPB 1000 mg/200 mL premix  Status:  Discontinued     1,000 mg 200 mL/hr over 60 Minutes Intravenous Every 24 hours 05/25/19 0958 05/31/19 1438   05/24/19 2200  vancomycin (VANCOREADY) IVPB 750 mg/150 mL  Status:  Discontinued     750 mg 150 mL/hr over 60 Minutes Intravenous Every 24 hours 05/24/19 1156 05/25/19 0958   05/23/19 2200  vancomycin (VANCOCIN) IVPB 1000 mg/200 mL premix     1,000 mg 200 mL/hr over 60 Minutes Intravenous  Once 05/23/19 1345 05/24/19 0008   05/23/19 1113  vancomycin variable dose per unstable renal function (pharmacist dosing)  Status:  Discontinued      Does not apply See admin instructions 05/23/19 1114 05/24/19 1156   05/22/19 1500  vancomycin (VANCOREADY) IVPB 1250 mg/250 mL  Status:  Discontinued     1,250 mg 166.7 mL/hr over 90 Minutes Intravenous Every 24 hours 05/22/19 1429 05/23/19 1114      Subjective: Patient seen and examined the bedside this morning.  Hemodynamically stable.  Looks comfortable.  Denies any abdomen pain, nausea or vomiting.  Still having brown loose stool on the eakins pouch.  Objective: Vitals:   06/02/19 0918 06/02/19 1700 06/02/19 2124 06/03/19 0510  BP: (!) 112/59 120/61 (!) 119/59 (!) 108/51  Pulse: 80 77 80 72  Resp: 17 16 16 16   Temp: 97.8 F (36.6 C) 98 F (36.7 C) 98.3 F (36.8 C) 98.7 F (37.1 C)  TempSrc: Oral Oral Oral Oral  SpO2: 96% 95% 94% 97%  Weight:   55 kg   Height:        Intake/Output Summary (Last 24 hours) at 06/03/2019 0843 Last data filed at 06/03/2019 0601 Gross per 24 hour  Intake 1474.07 ml  Output 3750 ml  Net -2275.93 ml   Filed Weights   05/29/19 2128 05/30/19 2058 06/02/19 2124  Weight: 60 kg 61 kg 55 kg    Examination:   General exam: Chronically ill looking, frail, cachectic, malnourished Respiratory system: Bilateral equal air entry, normal vesicular breath sounds, no wheezes or crackles    Cardiovascular system: S1 & S2 heard, RRR Gastrointestinal system: Abdomen is nondistended, soft and nontender.  Eakins pouch with brown liquid stool, drain on the left lower quadrant  Central nervous system: Alert and oriented. No focal neurological deficits. Extremities: Bilateral BKA Skin: Minor skin breakdowns   Data Reviewed: I have personally reviewed following labs and imaging studies  CBC: Recent Labs  Lab 05/28/19 0152 05/29/19 1003 05/30/19 0903 05/31/19 1020 06/02/19 0415  WBC 11.5* 8.2 12.1* 9.6 9.4  NEUTROABS  --  5.5  --  6.1 7.8*  HGB 7.1* 6.7* 10.2* 9.9* 8.9*  HCT 22.8* 21.6* 31.7* 30.6* 28.3*  MCV 98.3 96.4 91.1 91.3 94.3  PLT 390 397 393 367 102   Basic Metabolic Panel:  Recent Labs  Lab 05/30/19 0407 05/31/19 1020 06/01/19 0317 06/02/19 0415 06/03/19 0412  NA 133* 131* 132* 130* 131*  K 4.4 4.8 4.6 5.0 4.9  CL 108 106 108 105 105  CO2 19* 20* 19* 19* 21*  GLUCOSE 152* 178* 151* 185* 90  BUN 18 28* 32* 45* 57*  CREATININE 0.82 0.83 0.63 0.82 0.88  CALCIUM 8.3* 8.2* 8.2* 8.5* 8.5*  MG 1.7 1.9 1.8 2.1 2.1  PHOS 3.9 3.3 3.8 4.4 4.5   GFR: Estimated Creatinine Clearance: 55.1 mL/min (by C-G formula based on SCr of 0.88 mg/dL). Liver Function Tests: Recent Labs  Lab 05/28/19 0152 05/29/19 1003 05/30/19 0407 06/02/19 0415  AST 15 14* 12* 10*  ALT 17 15 14 9   ALKPHOS 137* 128* 123 104  BILITOT 0.8 0.8 0.7 0.6  PROT 6.7 6.5 6.6 6.1*  ALBUMIN 1.5* 1.5* 1.5* 1.3*   No results for input(s): LIPASE, AMYLASE in the last 168 hours. No results for input(s): AMMONIA in the last 168 hours. Coagulation Profile: No results for input(s): INR, PROTIME in the last 168 hours. Cardiac Enzymes: No results for input(s): CKTOTAL, CKMB, CKMBINDEX, TROPONINI in the last 168 hours. BNP (last 3 results) No results for input(s): PROBNP in the last 8760 hours. HbA1C: No results for input(s): HGBA1C in the last 72 hours. CBG: Recent Labs  Lab 06/02/19 1105  06/02/19 1604 06/02/19 2124 06/03/19 0039 06/03/19 0542  GLUCAP 111* 169* 108* 83 85   Lipid Profile: Recent Labs    06/02/19 0415  TRIG 101   Thyroid Function Tests: No results for input(s): TSH, T4TOTAL, FREET4, T3FREE, THYROIDAB in the last 72 hours. Anemia Panel: No results for input(s): VITAMINB12, FOLATE, FERRITIN, TIBC, IRON, RETICCTPCT in the last 72 hours. Sepsis Labs: No results for input(s): PROCALCITON, LATICACIDVEN in the last 168 hours.  Recent Results (from the past 240 hour(s))  Culture, blood (routine x 2)     Status: None (Preliminary result)   Collection Time: 06/01/19  9:13 AM   Specimen: BLOOD RIGHT HAND  Result Value Ref Range Status   Specimen Description BLOOD RIGHT HAND  Final   Special Requests   Final    BOTTLES DRAWN AEROBIC ONLY Blood Culture adequate volume   Culture NO GROWTH 2 DAYS  Final   Report Status PENDING  Incomplete  Culture, blood (routine x 2)     Status: None (Preliminary result)   Collection Time: 06/01/19  9:13 AM   Specimen: BLOOD  Result Value Ref Range Status   Specimen Description BLOOD LEFT ANTECUBITAL  Final   Special Requests   Final    BOTTLES DRAWN AEROBIC ONLY Blood Culture results may not be optimal due to an inadequate volume of blood received in culture bottles   Culture NO GROWTH 2 DAYS  Final   Report Status PENDING  Incomplete         Radiology Studies: DG Chest Port 1 View  Result Date: 06/01/2019 CLINICAL DATA:  Fever evaluation. EXAM: PORTABLE CHEST 1 VIEW COMPARISON:  Chest radiograph 05/21/2019 FINDINGS: A right-sided PICC is present with tip projecting in the region of the upper right atrium. Shallow inspiration radiograph with accentuation of the cardiac silhouette. Probable mild cardiomegaly, unchanged. Similar appearance of bibasilar opacities. Persistent small right pleural effusion. No evidence of pneumothorax. No acute bony abnormality IMPRESSION: Similar appearance of bibasilar opacities (greater  on the right) which may reflect atelectasis or pneumonia. Persistent small right pleural effusion. Electronically Signed   By: Kellie Simmering  DO   On: 06/01/2019 09:11        Scheduled Meds: . amLODipine  5 mg Oral Daily  . chlorhexidine  15 mL Mouth Rinse BID  . Chlorhexidine Gluconate Cloth  6 each Topical Daily  . enoxaparin (LOVENOX) injection  40 mg Subcutaneous Q24H  . escitalopram  10 mg Oral Daily  . gabapentin  300 mg Oral BID  . insulin aspart  0-15 Units Subcutaneous Q6H  . loperamide  2 mg Oral Q8H  . mouth rinse  15 mL Mouth Rinse q12n4p  . melatonin  3 mg Oral QHS  . metoprolol tartrate  50 mg Oral BID  . multivitamin with minerals  1 tablet Oral Daily  . pantoprazole  40 mg Oral Daily  . simethicone  80 mg Oral QID   Continuous Infusions: . TPN ADULT (ION) 75 mL/hr at 06/02/19 1746  . TPN ADULT (ION)    . vancomycin 1,250 mg (06/01/19 1218)     LOS: 12 days    Time spent: 25 mins, More than 50% of that time was spent in counseling and/or coordination of care.      Shelly Coss, MD [facility] P3/23/2021, 8:43 AM

## 2019-06-03 NOTE — Progress Notes (Signed)
Pharmacy Antibiotic Note  Gina [name] is a 59 y.o. female called back in for admission on 05/22/2019 with MRSA bacteremia. Pharmacy has been consulted for Vancomycin dosing.   Last Vanc levels 3/20 and regimen adjusted.  Creatinine about the same since dose adjusted, BUN trending up some.   Plan:  Continue Vancomycin 1250 mg IV q48 hours - due today, last dose 3/25.  Estimated AUC 464  Stop date 3/25, OPAT previously updated.  Height: 5\' 2"  (157.5 cm) Weight: 121 lb 4.1 oz (55 kg) IBW/kg (Calculated) : 50.1  Temp (24hrs), Avg:98.3 F (36.8 C), Min:98 F (36.7 C), Max:98.7 F (37.1 C)  Recent Labs  Lab 05/28/19 0152 05/28/19 0152 05/29/19 1003 05/29/19 1003 05/30/19 0407 05/30/19 0903 05/31/19 1020 05/31/19 1030 05/31/19 2203 06/01/19 0317 06/02/19 0415 06/03/19 0412  WBC 11.5*  --  8.2  --   --  12.1* 9.6  --   --   --  9.4  --   CREATININE 0.84   < > 0.97   < > 0.82  --  0.83  --   --  0.63 0.82 0.88  VANCORANDOM  --   --   --   --   --   --   --  30 22  --   --   --    < > = values in this interval not displayed.    Estimated Creatinine Clearance: 55.1 mL/min (by C-G formula based on SCr of 0.88 mg/dL).    Allergies  Allergen Reactions  . Bee Venom Swelling    Swells up with bee stings   . Metformin And Related Other (See Comments)    Upset stomach   Thank you for allowing pharmacy to be a part of this patient's care.  Arty Baumgartner,  Phone: [phone] 06/03/2019 11:38 AM

## 2019-06-03 NOTE — Progress Notes (Signed)
Occupational Therapy Treatment Patient Details Name: Gina [name] MRN: [id number] DOB: [birth year] Today's Date: 06/03/2019    History of present illness Pt is a 59 y/o female admitted from SNF secondary to abnormal lab values. Found to have MRSA bacteremia. Pt also with abdominal fistula and multiple abdominal drains secondary to previous complicated hospital admission. Pt now s/p 1 unit PRBC 3/18. PMH includes HTN, DM, bilateral BKA, and schizophrenia.   OT comments  Session limited this date due to buttocks pain. Pt declining EOB tasks due to pain with bed level activities performed. Began education/instruction with pt on BUE HEP with use of level 1 theraband. Attempted shld horizontal abduction however pt unable to complete due to weakness. Instructed pt on elbow flex/ext exercises with fair understanding and follow through. Pt required mod assist and cues on technique. Pt's pad noted to be soiled in BM with RN in to assist with bed change. Pt able to roll side to side with mod to max assist, utilizing bed rails for support. Educated/instructed pt on pursed lip breathing techniques to assist with pain management during rolling with fair understanding and follow through. Pt required total assist to get cleaned up, able to wash hands and face with setup at bed level. Pt required increased time to complete all tasks. OT will continue to follow acutely.    Follow Up Recommendations  SNF;Supervision/Assistance - 24 hour(Pt and family refusing SNF)    Equipment Recommendations  Wheelchair (measurements OT);Wheelchair cushion (measurements OT);Hospital bed;Other (comment)(hoyer lift)    Recommendations for Other Services      Precautions / Restrictions Precautions Precautions: Fall;Other (comment) Precaution Comments: bilateral BKA  Restrictions Weight Bearing Restrictions: No       Mobility Bed Mobility Overal bed mobility: Needs Assistance Bed Mobility: Rolling Rolling: Mod assist;Max  assist         General bed mobility comments: Instructed pt on use of bedrails with pt requiring assist to reach. Assist for trunk.  Transfers                 General transfer comment: Bed level tasks only this date    Balance               Standing balance comment: Pt declined EOB tasks this date due to pain                           ADL either performed or assessed with clinical judgement   ADL Overall ADL's : Needs assistance/impaired     Grooming: Wash/dry hands;Wash/dry face;Set up;Supervision/safety;Bed level       Lower Body Bathing: Total assistance;Bed level               Toileting- Clothing Manipulation and Hygiene: Total assistance;Bed level         General ADL Comments: Pt declined EOB tasks this date due to pain. Worked on rolling in bed to get cleaned up and change soiled sheets.      Vision       Perception     Praxis      Cognition Arousal/Alertness: Awake/alert Behavior During Therapy: WFL for tasks assessed/performed Overall Cognitive Status: No family/caregiver present to determine baseline cognitive functioning                                 General Comments: Pt required min to mod encouragement to participate in therapy tasks.  Pt declined EOB activities this date due to buttocks pain        Exercises Exercises: Other exercises Other Exercises Other Exercises: Began education with pt on BUE theraband HEP. Attempted shld horizontal abduction however pt unable to complete due to weakness. Instructed pt on elbow flex/ext exercises with fair understanding and follow through. Pt required mod assist and cues on technique. Completed at bed level as pt declined EOB tasks due to pain.    Shoulder Instructions       General Comments RN present for part of session to assist with bed change.    Pertinent Vitals/ Pain       Pain Assessment: Faces Faces Pain Scale: Hurts whole lot Pain Location:  buttocks with pressure (when rolling or sitting) Pain Descriptors / Indicators: Grimacing;Guarding;Crying;Discomfort;Sore Pain Intervention(s): Limited activity within patient's tolerance;Monitored during session;Repositioned(RN present and aware)  Home Living                                          Prior Functioning/Environment              Frequency           Progress Toward Goals  OT Goals(current goals can now be found in the care plan section)  Progress towards OT goals: Progressing toward goals  ADL Goals Pt Will Perform Grooming: with set-up;sitting;bed level Pt Will Perform Upper Body Dressing: with set-up;sitting;bed level Pt/caregiver will Perform Home Exercise Program: Increased strength;Increased ROM;Both right and left upper extremity;With written HEP provided;With Supervision Additional ADL Goal #1: Pt will perform bed mobility with minA as precursor to EOB/OOB ADL.  Plan Discharge plan remains appropriate    Co-evaluation                 AM-PAC OT "6 Clicks" Daily Activity     Outcome Measure   Help from another person eating meals?: A Little Help from another person taking care of personal grooming?: A Little Help from another person toileting, which includes using toliet, bedpan, or urinal?: Total Help from another person bathing (including washing, rinsing, drying)?: A Lot Help from another person to put on and taking off regular upper body clothing?: A Little Help from another person to put on and taking off regular lower body clothing?: Total 6 Click Score: 13    End of Session    OT Visit Diagnosis: Muscle weakness (generalized) (M62.81);Pain;Other abnormalities of gait and mobility (R26.89) Pain - Right/Left: (bilateral) Pain - part of body: (buttocks and abdomen)   Activity Tolerance Patient limited by pain   Patient Left in bed;with call bell/phone within reach;with nursing/sitter in room   Nurse Communication  Mobility status        Time: 7711-6579 OT Time Calculation (min): 30 min  Charges: OT General Charges $OT Visit: 1 Visit OT Treatments $Therapeutic Activity: 8-22 mins $Therapeutic Exercise: 8-22 mins  Mauri Brooklyn OTR/L [phone]   Mauri Brooklyn 06/03/2019, 10:51 AM

## 2019-06-03 NOTE — Progress Notes (Signed)
PHARMACY - TOTAL PARENTERAL NUTRITION CONSULT NOTE   Indication: Fistula  Patient Measurements: Height: 5\' 2"  (157.5 cm) Weight: 121 lb 4.1 oz (55 kg) IBW/kg (Calculated) : 50.1 TPN AdjBW (KG): 54.6 Body mass index is 22.18 kg/m.  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  Significant history of DM with bilateral BKA.  Presenting from SNF with MRSA bacteremia, suspected source from PICC.  Has been on oral diet this admission with line holiday however decreased appetite and may not be absorbing much per RD, unable to meet requirements.  Moderate malnutrition of chronic disease per RD assessment.     Glucose / Insulin: History of DM and requirement of insulin in past TPNs. Previously this admission on D10 to maintain euglycemia before TPN start. Current CBGs controlled on moderate SSI at 85-111, required 5 units of Novolog insulin in last 24 hours.  Electrolytes: Na low at 131, K 4.9, CO2 21, Mag 2.1, Phos 4.5, and CoCa 10.2. Renal: SCr 0.88- stable. BUN up 32>>45>>57.  LFTs / TGs: LFTs/Tbili within normal limits. TG 181>101.  Prealbumin / albumin: Prealbumin 7>>6/ Albumin 1.5>>1.3 Intake / Output; MIVF: UOP 2.8 mL/kg/hr. Colostomy output 0 mL. JP drain 0 mL. GI Imaging: none since start of TPN Surgeries / Procedures: none since start of TPN  Central access: PICC replaced 05/29/19 TPN start date: 05/29/19  Nutritional Goals (per RD recommendation on 3/18): kCal: 1800-2000, Protein: 83-110g, Fluid: >= 1.9L Goal TPN rate is 75 mL/hr (provides 108 g of protein and 1819 kcals per day)  Current Nutrition:  Soft diet - Dysphagia 2 --intake 0% of meals yesterday and no supplements charted   Plan:  Continue custom TPN to goal rate of 75 mL/hr at 1800. Electrolytes in TPN: 50 mEq/L of Na, 20 mEq/L of K (36 mEq/day), 2.5 mEq/L of Ca, 6 mEq/L of Mg, and decrease to 10 mmol/L of Phos. Maximize Acetate. Patient receiving oral  MVI which contains trace - will not add to TPN.  Continue  Moderate q6h SSI and adjust as needed - patient only required 5 units of insulin over last 24 hours but got a little low this am at 85, will decrease insulin in the TPN to 7 units for tonight  Monitor TPN labs on Mon/Thurs, and daily with start of TPN.   Monitor po intake and plan for TPN long-term/discharge.  If CBGs remain controlled, consider transition to cyclic TPN pending long-term plans.   Alanda Slim, PharmD, [facility] Clinical Pharmacist Please see AMION for all Pharmacists' Contact Phone Numbers 06/03/2019, 7:15 AM

## 2019-06-04 LAB — GLUCOSE, CAPILLARY
Glucose-Capillary: 95 mg/dL (ref 70–99)
Glucose-Capillary: 115 mg/dL — ABNORMAL HIGH (ref 70–99)
Glucose-Capillary: 106 mg/dL — ABNORMAL HIGH (ref 70–99)

## 2019-06-04 MED ORDER — TRAVASOL 10 % IV SOLN
INTRAVENOUS | Status: AC
  Filled 2019-06-04: qty 1080

## 2019-06-04 NOTE — TOC Progression Note (Signed)
Transition of Care [facility]) - Progression Note    Patient Details  Name: Gina [name] MRN: [id number] Date of Birth: 07-28-1960  Transition of Care [facility]) CM/SW Contact  Sharlet Salina Mila Homer, LCSW Phone Number: 06/04/2019, 2:26 PM  Clinical Narrative: Unable to find a home health agency that can accept patient, primarily due to nursing staffing issues. PCS services have been approved, however patient will not have nursing services to monitor and assist with TPN and/or [place] pouch through a [facility] agency.   Contacted daughter, [name] [phone]), and update provided on [facility] search and her questions and concerns addressed. The daughter has submitted for FMLA and plans to use it if needed to be at home with her mother, however she expressed hope that a [facility] agency can be located to assist her mother at home. [name] agreeable to [facility] being contacted.    Expected Discharge Plan: Skilled Nursing Facility(Definite plan not decided yet by patient/adult children. It is between returning to SNF or going home with services (possibly PCS/HH)) Barriers to Discharge: Continued Medical Work up  Expected Discharge Plan and Services Expected Discharge Plan: Skilled Nursing Facility(Definite plan not decided yet by patient/adult children. It is between returning to SNF or going home with services (possibly PCS/HH))                                             Social Determinants of Health (SDOH) Interventions    Readmission Risk Interventions Readmission Risk Prevention Plan 02/10/2019 11/07/2017  Post Dischage Appt - Patient refused  Medication Screening - Complete  Transportation Screening Complete Complete  PCP follow-up - Patient refused  PCP or Specialist Appt within 3-5 Days Not Complete -  Not Complete comments DC date unknown- pt established with PCP -  [place] or [place] Complete -  Social Work Consult for [place] Planning/Counseling Not Complete -  SW  consult not completed comments awaiting call back from daughter and APS -  Palliative Care Screening Not Complete -  Palliative Care Screening Not Complete Comments pending need -  Medication Review ([place]) Referral to Pharmacy -  Some recent data might be hidden

## 2019-06-04 NOTE — [place] Notes (Signed)
PROGRESS NOTE    Gina [name]  BZJ:[id number] DOB: 01-07-1961 DOA: 05/22/2019 PCP: [name], MD   Brief Narrative: 59 year old female with DM, HTN, schizophrenia, bilateral BKA sent from [facility] due to positive blood culture. She was recently admitted  on November 2020 for perforated prepyloric ulcer with septic shock with acute peritonitis status post exlap with [name] patch on 11/23, and was discharged to [facility] on Abx and antifungal for total 6 weeks. Patient was discharged with Foley catheter, PICC line, bilateral lower quadrant drains along with enterocutaneous fistula with colostomy bag . She was discharged from [facility] to [facility] whiere she has been living for last 3 months. Patient was sent to ER for vague sleepiness, malaise ,anorexia low-grade fever and leukocytosis at SNF, and in the ER exam labs were  unremarkable and patient was sent back to SNF but overnight blood culture showed MRSA and she was readmitted to the hospital. Patient is being followed by infectious disease, general surgery.She did well with speech, diet was restarted.Foley catheter was exchanged. Repeat blood culture negative from 3/2. ID has advised to continue vancomycin through 3/25 and ordered PICC line.Surgery has been following patient and on calorie count,  Patient and family planning to return home with home health services upon discharge and has declined skilled nursing facility placement.  She has been started back on TPN.  She was febrile last weekend.  Afebrile since last 3 days.  General surgery following. Discharge planning to home  with home health after surgical clearance.  Assessment & Plan:   Principal Problem:   MRSA bacteremia Active Problems:   HTN (hypertension)   Diabetes ([place])   Severe protein-calorie malnutrition ([place])   Anemia of chronic disease   Type 2 diabetes mellitus with hyperosmolar nonketotic hyperglycemia (HCC)   Pressure injury of right thigh,  unstageable (HCC)   Below-knee amputee ([place])   Atherosclerosis of native arteries of extremities with gangrene, right leg (HCC)   Intra-abdominal fluid collection   Palliative care by specialist   Goals of care, counseling/discussion   DNR (do not resuscitate)   Physical deconditioning   Dysphagia   Malnutrition of moderate degree  MSSA bacteremia: Indwelling PICC line used for TPN was felt to be the likely source, PICC line was removed.  Repeat blood cultures have been negative.  New PICC line placed on 3/18.  Plan is to continue vancomycin through 3/25.  ID was following Had febrile episodes on weekend.  Afebrile for last 3 days.  New cultures have been sent, so far negative.  Chest x-ray did not show any pneumonia. If she becomes persistently febrile, will plan for CT abdomen.  Intra-abdominal fluid collection: Patient with history of exploratory laparotomy/partial small bowel resection for intra-abdominal abscess in November.  She has Eakins pouch and LLQ drain. She was admitted in November 2020 for perforated prepyloric ulcer with septic shock, acute peritonitis.  Status post exploratory laparotomy with [name] patch. She had 2 separate enterocutaneous fistula.  She might need injection studies to further characterize fistulas in the future as well as EGD.  Moderate protein calorie malnutrition: Dietitian following.  Currently on TPN.Also on oral diet.  Currently having loose stools.  On Imodium  Hypertension: Currently blood pressure well controlled.  On amlodipine  Diabetes type 2: Hemoglobin A1c of 5.2 as per  3/12.  Currently on sliding scale insulin.  Normocytic anemia: Has been transfused with several units of PRBC during the hospitalization.  Currently hemoglobin stable  GERD: Continue PPI  Mood  disorder: Continue Celexa  Goals of care: Patient is DNR.  Palliative care was following.  Recommended to follow-up as an outpatient.  Pressure Injury 02/03/19 Coccyx  Right;Left;Medial Unstageable - Full thickness tissue loss in which the base of the ulcer is covered by slough (yellow, tan, gray, green or brown) and/or eschar (tan, brown or black) in the wound bed. pt has 3 small injuri (Active)  02/03/19 0230  Location: Coccyx  Location Orientation: Right;Left;Medial  Staging: Unstageable - Full thickness tissue loss in which the base of the ulcer is covered by slough (yellow, tan, gray, green or brown) and/or eschar (tan, brown or black) in the wound bed.  Wound Description (Comments): pt has 3 small injuries all in the same area, one is medial and two are on either side of it  Present on Admission: Yes     Pressure Injury 05/22/19 Coccyx Bilateral Stage 2 -  Partial thickness loss of dermis presenting as a shallow open injury with a red, pink wound bed without slough. (Active)  05/22/19 2104  Location: Coccyx  Location Orientation: Bilateral  Staging: Stage 2 -  Partial thickness loss of dermis presenting as a shallow open injury with a red, pink wound bed without slough.  Wound Description (Comments):   Present on Admission: Yes     Pressure Injury 05/22/19 Knee Right;Distal Unstageable - Full thickness tissue loss in which the base of the injury is covered by slough (yellow, tan, gray, green or brown) and/or eschar (tan, brown or black) in the wound bed. Stump (Active)  05/22/19 2106  Location: Knee  Location Orientation: Right;Distal  Staging: Unstageable - Full thickness tissue loss in which the base of the injury is covered by slough (yellow, tan, gray, green or brown) and/or eschar (tan, brown or black) in the wound bed.  Wound Description (Comments): Stump  Present on Admission: Yes     Pressure Injury 05/22/19 Knee Left;Distal Unstageable - Full thickness tissue loss in which the base of the injury is covered by slough (yellow, tan, gray, green or brown) and/or eschar (tan, brown or black) in the wound bed. Stump (Active)  05/22/19 2108    Location: Knee  Location Orientation: Left;Distal  Staging: Unstageable - Full thickness tissue loss in which the base of the injury is covered by slough (yellow, tan, gray, green or brown) and/or eschar (tan, brown or black) in the wound bed.  Wound Description (Comments): Stump  Present on Admission: Yes     Pressure Injury 05/22/19 Thigh Left;Mid Stage 2 -  Partial thickness loss of dermis presenting as a shallow open injury with a red, pink wound bed without slough. (Active)  05/22/19 2109  Location: Thigh  Location Orientation: Left;Mid  Staging: Stage 2 -  Partial thickness loss of dermis presenting as a shallow open injury with a red, pink wound bed without slough.  Wound Description (Comments):   Present on Admission: Yes         Nutrition Problem: Moderate Malnutrition Etiology: chronic illness(EC fistula)      DVT prophylaxis:Lovenox Code Status: DNR Family Communication: Called daughter and discussed on the phone on 06/03/2019 Disposition Plan: Patient is from skilled nursing facility.  PT/OT recommending skilled nursing facility.   Patient and her daughter deny skilled nursing facility and want to go home with home health.  She is medically stable for discharge from medical perspective.  Waiting for surgical clearance.   Consultants: General surgery, ID  Procedures:As above  Antimicrobials:  Anti-infectives (From admission, onward)  Start     Dose/Rate Route Frequency Ordered Stop   06/01/19 1200  vancomycin (VANCOCIN) IVPB 1000 mg/200 mL premix  Status:  Discontinued     1,000 mg 200 mL/hr over 60 Minutes Intravenous Every 48 hours 05/31/19 2352 06/01/19 0813   06/01/19 1200  vancomycin (VANCOREADY) IVPB 1250 mg/250 mL     1,250 mg 166.7 mL/hr over 90 Minutes Intravenous Every 48 hours 06/01/19 0813 06/05/19 2359   05/31/19 1438  vancomycin variable dose per unstable renal function (pharmacist dosing)  Status:  Discontinued      Does not apply See admin  instructions 05/31/19 1438 05/31/19 2351   05/25/19 2200  vancomycin (VANCOCIN) IVPB 1000 mg/200 mL premix  Status:  Discontinued     1,000 mg 200 mL/hr over 60 Minutes Intravenous Every 24 hours 05/25/19 0958 05/31/19 1438   05/24/19 2200  vancomycin (VANCOREADY) IVPB 750 mg/150 mL  Status:  Discontinued     750 mg 150 mL/hr over 60 Minutes Intravenous Every 24 hours 05/24/19 1156 05/25/19 0958   05/23/19 2200  vancomycin (VANCOCIN) IVPB 1000 mg/200 mL premix     1,000 mg 200 mL/hr over 60 Minutes Intravenous  Once 05/23/19 1345 05/24/19 0008   05/23/19 1113  vancomycin variable dose per unstable renal function (pharmacist dosing)  Status:  Discontinued      Does not apply See admin instructions 05/23/19 1114 05/24/19 1156   05/22/19 1500  vancomycin (VANCOREADY) IVPB 1250 mg/250 mL  Status:  Discontinued     1,250 mg 166.7 mL/hr over 90 Minutes Intravenous Every 24 hours 05/22/19 1429 05/23/19 1114      Subjective: Patient seen and examined the bedside this morning.  Hemodynamically stable.  Denies any complaints.  No nausea, vomiting or abdominal pain.  Objective: Vitals:   06/03/19 0510 06/03/19 0822 06/03/19 2044 06/04/19 0452  BP: (!) 108/51 (!) 105/53 (!) 110/58 (!) 114/52  Pulse: 72 68 76 72  Resp: 16 18 (!) 22 (!) 24  Temp: 98.7 F (37.1 C) 98.4 F (36.9 C) 98.8 F (37.1 C) 99.9 F (37.7 C)  TempSrc: Oral Oral Oral Oral  SpO2: 97% 96% 95% 96%  Weight:      Height:        Intake/Output Summary (Last 24 hours) at 06/04/2019 0824 Last data filed at 06/04/2019 0758 Gross per 24 hour  Intake 2117.22 ml  Output 2900 ml  Net -782.78 ml   Filed Weights   05/29/19 2128 05/30/19 2058 06/02/19 2124  Weight: 60 kg 61 kg 55 kg    Examination:  General exam: Chronically ill looking, frail, cachectic, malnourished Respiratory system: Bilateral equal air entry, normal vesicular breath sounds, no wheezes or crackles  Cardiovascular system: S1 & S2 heard, RRR. No JVD,  murmurs, rubs, gallops or clicks. Gastrointestinal system: Abdomen is nondistended, soft and nontender.  Eakin's pouch  with brown liquid stool, drain on the left lower quadrant Central nervous system: Alert and oriented. No focal neurological deficits. Extremities:Bilateral BKA Skin: Minor skin breakdowns  Data Reviewed: I have personally reviewed following labs and imaging studies  CBC: Recent Labs  Lab 05/29/19 1003 05/30/19 0903 05/31/19 1020 06/02/19 0415  WBC 8.2 12.1* 9.6 9.4  NEUTROABS 5.5  --  6.1 7.8*  HGB 6.7* 10.2* 9.9* 8.9*  HCT 21.6* 31.7* 30.6* 28.3*  MCV 96.4 91.1 91.3 94.3  PLT 397 393 367 517   Basic Metabolic Panel: Recent Labs  Lab 05/30/19 0407 05/31/19 1020 06/01/19 0317 06/02/19 0415 06/03/19 0017  NA 133* 131* 132* 130* 131*  K 4.4 4.8 4.6 5.0 4.9  CL 108 106 108 105 105  CO2 19* 20* 19* 19* 21*  GLUCOSE 152* 178* 151* 185* 90  BUN 18 28* 32* 45* 57*  CREATININE 0.82 0.83 0.63 0.82 0.88  CALCIUM 8.3* 8.2* 8.2* 8.5* 8.5*  MG 1.7 1.9 1.8 2.1 2.1  PHOS 3.9 3.3 3.8 4.4 4.5   GFR: Estimated Creatinine Clearance: 55.1 mL/min (by C-G formula based on SCr of 0.88 mg/dL). Liver Function Tests: Recent Labs  Lab 05/29/19 1003 05/30/19 0407 06/02/19 0415  AST 14* 12* 10*  ALT 15 14 9   ALKPHOS 128* 123 104  BILITOT 0.8 0.7 0.6  PROT 6.5 6.6 6.1*  ALBUMIN 1.5* 1.5* 1.3*   No results for input(s): LIPASE, AMYLASE in the last 168 hours. No results for input(s): AMMONIA in the last 168 hours. Coagulation Profile: No results for input(s): INR, PROTIME in the last 168 hours. Cardiac Enzymes: No results for input(s): CKTOTAL, CKMB, CKMBINDEX, TROPONINI in the last 168 hours. BNP (last 3 results) No results for input(s): PROBNP in the last 8760 hours. HbA1C: No results for input(s): HGBA1C in the last 72 hours. CBG: Recent Labs  Lab 06/03/19 0542 06/03/19 1111 06/03/19 1559 06/03/19 2155 06/04/19 0700  GLUCAP 85 86 104* 112* 95   Lipid  Profile: Recent Labs    06/02/19 0415  TRIG 101   Thyroid Function Tests: No results for input(s): TSH, T4TOTAL, FREET4, T3FREE, THYROIDAB in the last 72 hours. Anemia Panel: No results for input(s): VITAMINB12, FOLATE, FERRITIN, TIBC, IRON, RETICCTPCT in the last 72 hours. Sepsis Labs: No results for input(s): PROCALCITON, LATICACIDVEN in the last 168 hours.  Recent Results (from the past 240 hour(s))  Culture, blood (routine x 2)     Status: None (Preliminary result)   Collection Time: 06/01/19  9:13 AM   Specimen: BLOOD RIGHT HAND  Result Value Ref Range Status   Specimen Description BLOOD RIGHT HAND  Final   Special Requests   Final    BOTTLES DRAWN AEROBIC ONLY Blood Culture adequate volume   Culture   Final    NO GROWTH 3 DAYS Performed at [facility], [address], [place], [place] [address]    Report Status PENDING  Incomplete  Culture, blood (routine x 2)     Status: None (Preliminary result)   Collection Time: 06/01/19  9:13 AM   Specimen: BLOOD  Result Value Ref Range Status   Specimen Description BLOOD LEFT ANTECUBITAL  Final   Special Requests   Final    BOTTLES DRAWN AEROBIC ONLY Blood Culture results may not be optimal due to an inadequate volume of blood received in culture bottles   Culture   Final    NO GROWTH 3 DAYS Performed at [facility], [place] [address] [place], [place] [address]    Report Status PENDING  Incomplete         Radiology Studies: No results found.      Scheduled Meds: . amLODipine  5 mg Oral Daily  . chlorhexidine  15 mL Mouth Rinse BID  . Chlorhexidine Gluconate Cloth  6 each Topical Daily  . enoxaparin (LOVENOX) injection  40 mg Subcutaneous Q24H  . escitalopram  10 mg Oral Daily  . gabapentin  300 mg Oral BID  . insulin aspart  0-15 Units Subcutaneous Q6H  . loperamide  2 mg Oral Q8H  . mouth rinse  15 mL Mouth Rinse q12n4p  .  melatonin  3 mg Oral QHS  . metoprolol tartrate  50 mg Oral BID  .  multivitamin with minerals  1 tablet Oral Daily  . pantoprazole  40 mg Oral Daily  . simethicone  80 mg Oral QID   Continuous Infusions: . TPN ADULT (ION) 75 mL/hr at 06/03/19 1834  . vancomycin 1,250 mg (06/03/19 1520)     LOS: 13 days    Time spent: 25 mins, More than 50% of that time was spent in counseling and/or coordination of care.      Shelly Coss, MD [facility] P3/24/2021, 8:24 AM

## 2019-06-04 NOTE — [place] Notes (Signed)
Physical Therapy Treatment Patient Details Name: Gina [name] MRN: [id number] DOB: 1960-05-20 Today's Date: 06/04/2019    History of Present Illness Pt is a 59 y/o female admitted from SNF secondary to abnormal lab values. Found to have MRSA bacteremia. Pt also with abdominal fistula and multiple abdominal drains secondary to previous complicated hospital admission. Pt now s/p 1 unit PRBC 3/18. PMH includes HTN, DM, bilateral BKA, and schizophrenia.    PT Comments    Pt agreeable to EOB activity with mod PT encouragement this session. Pt tolerated sitting EOB with both static and dynamic tasks ~15 minutes. Pt requires max assist for all mobility at present. Pt upset and near tearful this session, states her daughter is expressing concerns about pt d/cing home due to significant level of care she will provide. Pt is very adamant about going home, but if family support and 24/7 care is not available, SNF may be safer option. PT to continue to follow acutely.    Follow Up Recommendations  Home health PT;Supervision/Assistance - 24 hour(family and pt prefer home to SNF)     Equipment Recommendations  Other (comment);Hospital bed(pt needs power WC with tilt-in-space capabilities for pressure relief, as well as pressure relief mattress and hoyer lift for transfers)    Recommendations for Other Services       Precautions / Restrictions Precautions Precautions: Fall;Other (comment) Precaution Comments: bilateral BKA  Restrictions Weight Bearing Restrictions: No    Mobility  Bed Mobility Overal bed mobility: Needs Assistance Bed Mobility: Rolling;Sidelying to Sit;Sit to Supine Rolling: Max assist Sidelying to sit: Max assist;HOB elevated   Sit to supine: Max assist;HOB elevated   General bed mobility comments: Max assist for rolling and sidelying to sit for trunk and LE management, scooting to EOB with use of bed pads. Log roll technique encouraged to minimize shear on buttocks. Max  assist to return to supine  Transfers                 General transfer comment: unable this session  Ambulation/Gait                 Stairs             Wheelchair Mobility    Modified Rankin (Stroke Patients Only)       Balance Overall balance assessment: Needs assistance Sitting-balance support: Single extremity supported;Feet supported Sitting balance-Leahy Scale: Fair Sitting balance - Comments: Tolerates dynamic LE tasks in sitting without LOB but does lean posteriorly Postural control: Posterior lean     Standing balance comment: pt unable                            Cognition Arousal/Alertness: Awake/alert Behavior During Therapy: WFL for tasks assessed/performed Overall Cognitive Status: No family/caregiver present to determine baseline cognitive functioning                                 General Comments: Requires PT encouragement to participate in mobility, tearful this session due to pt's family giving pushback on pt going home      Exercises General Exercises - Lower Extremity Long Arc Quad: AROM;Both;15 reps;Seated(3x5 reps) Hip ABduction/ADduction: Both;AAROM;10 reps;Seated(2x5 reps) Hip Flexion/Marching: AROM;Both;5 reps;Seated    General Comments General comments (skin integrity, edema, etc.): bilateral residual limb dressing coming off, with serosanguinous fluid noted coming from both. RN notified of need for dressing change.  Pertinent Vitals/Pain Pain Assessment: Faces Faces Pain Scale: Hurts even more Pain Location: LEs, buttocks during mobility Pain Descriptors / Indicators: Grimacing;Guarding;Crying;Discomfort;Sore Pain Intervention(s): Limited activity within patient's tolerance;Monitored during session;Premedicated before session;Repositioned    Home Living                      Prior Function            PT Goals (current goals can now be found in the care plan section) Acute  Rehab PT Goals Patient Stated Goal: wants to go home PT Goal Formulation: With patient Time For Goal Achievement: 05/23/19 Potential to Achieve Goals: Fair Progress towards PT goals: Progressing toward goals    Frequency    Min 2X/week      PT Plan Current plan remains appropriate    Co-evaluation              AM-PAC PT "6 Clicks" Mobility   Outcome Measure  Help needed turning from your back to your side while in a flat bed without using bedrails?: Total Help needed moving from lying on your back to sitting on the side of a flat bed without using bedrails?: Total Help needed moving to and from a bed to a chair (including a wheelchair)?: Total Help needed standing up from a chair using your arms (e.g., wheelchair or bedside chair)?: Total Help needed to walk in hospital room?: Total Help needed climbing 3-5 steps with a railing? : Total 6 Click Score: 6    End of Session   Activity Tolerance: Patient tolerated treatment well;Patient limited by fatigue Patient left: in bed;with call bell/phone within reach;with nursing/sitter in room Nurse Communication: Mobility status PT Visit Diagnosis: Difficulty in walking, not elsewhere classified (R26.2);Pain Pain - Right/Left: (bilateral) Pain - part of body: (buttocks)     Time: 8309-4076 PT Time Calculation (min) (ACUTE ONLY): 26 min  Charges:  $Therapeutic Exercise: 8-22 mins $Neuromuscular Re-education: 8-22 mins                     Cornie Mccomber E, PT Acute Rehabilitation Services Pager [phone]  Office [phone]  Dilara Navarrete D Aloysious Vangieson 06/04/2019, 2:05 PM

## 2019-06-04 NOTE — [place] Notes (Signed)
PHARMACY - TOTAL PARENTERAL NUTRITION CONSULT NOTE   Indication: Fistula  Patient Measurements: Height: 5\' 2"  (157.5 cm) Weight: 121 lb 4.1 oz (55 kg) IBW/kg (Calculated) : 50.1 TPN AdjBW (KG): 54.6 Body mass index is 22.18 kg/m.  Assessment:  88 YOF with hx of partial small bowel resection/EC fistula on chronic TPN since beginning of year.  Significant history of DM with bilateral BKA.  Presenting from SNF with MRSA bacteremia, suspected source from PICC.  Has been on oral diet this admission with line holiday however decreased appetite and may not be absorbing much per RD, unable to meet requirements.  Moderate malnutrition of chronic disease per RD assessment.     Glucose / Insulin: History of DM and requirement of insulin in past TPNs. Previously this admission on D10 to maintain euglycemia before TPN start. Current CBGs low 85-112, required 0 units of Novolog insulin in last 24 hours and 7 units of insulin in TPN.  Electrolytes: labs from 3/23: Na low at 131, K 4.9, CO2 21, Mag 2.1, Phos 4.5, and CoCa 10.2. Renal: SCr 0.88- stable. BUN up 32>>45>>57.  LFTs / TGs: LFTs/Tbili within normal limits. TG 181>101.  Prealbumin / albumin: Prealbumin 7>>6/ Albumin 1.5>>1.3 Intake / Output; MIVF: UOP 2.8 mL/kg/hr. Colostomy output 200 mL - on Imodium 2mg  every 8 hours. JP drain 0 mL.  GI Imaging: none since start of TPN Surgeries / Procedures: none since start of TPN  Central access: PICC replaced 05/29/19 TPN start date: 05/29/19  Nutritional Goals (per RD recommendation on 3/18): kCal: 1800-2000, Protein: 83-110g, Fluid: >= 1.9L Goal TPN rate is 75 mL/hr (provides 108 g of protein and 1819 kcals per day)  Current Nutrition:  Soft diet - Dysphagia 2 --intake 0-10% of meals and no supplements charted   Plan:  Continue custom TPN to goal rate of 75 mL/hr at 1800. Electrolytes in TPN: 50 mEq/L of Na, 20 mEq/L of K (36 mEq/day), 2.5 mEq/L of Ca, 6 mEq/L of Mg, and decrease to 10 mmol/L of  Phos. Maximize Acetate. Patient receiving oral MVI which contains trace - will not add to TPN.   CBGs currently running low (no po intake in addition to TPN now) - Will remove insulin from TPN at this time to prevent hypoglycemia. Continue coverage with Moderate q6h SSI and adjust as needed. If begins eating oral diet - will need to reconsider need for insulin in TPN.  TPN labs Mon/Thurs.    Monitor po intake and plan for TPN long-term/discharge.  If CBGs remain controlled, consider transition to cyclic TPN pending long-term plans.   Sloan Leiter, PharmD, BCPS, BCCCP Clinical Pharmacist Please refer to [facility] for [facility] numbers 06/04/2019, 8:57 AM

## 2019-06-05 LAB — COMPREHENSIVE METABOLIC PANEL
ALT: 16 U/L (ref 0–44)
AST: 17 U/L (ref 15–41)
Albumin: 1.4 g/dL — ABNORMAL LOW (ref 3.5–5.0)
Alkaline Phosphatase: 150 U/L — ABNORMAL HIGH (ref 38–126)
Anion gap: 5 (ref 5–15)
BUN: 62 mg/dL — ABNORMAL HIGH (ref 6–20)
CO2: 23 mmol/L (ref 22–32)
Calcium: 8.6 mg/dL — ABNORMAL LOW (ref 8.9–10.3)
Chloride: 103 mmol/L (ref 98–111)
Creatinine, Ser: 0.87 mg/dL (ref 0.44–1.00)
GFR calc Af Amer: >60 mL/min (ref >60)
GFR calc non Af Amer: >60 mL/min (ref >60)
Glucose, Bld: 186 mg/dL — ABNORMAL HIGH (ref 70–99)
Potassium: 4.9 mmol/L (ref 3.5–5.1)
Sodium: 131 mmol/L — ABNORMAL LOW (ref 135–145)
Total Bilirubin: 0.5 mg/dL (ref 0.3–1.2)
Total Protein: 6.5 g/dL (ref 6.5–8.1)

## 2019-06-05 LAB — GLUCOSE, CAPILLARY
Glucose-Capillary: 103 mg/dL — ABNORMAL HIGH (ref 70–99)
Glucose-Capillary: 127 mg/dL — ABNORMAL HIGH (ref 70–99)
Glucose-Capillary: 137 mg/dL — ABNORMAL HIGH (ref 70–99)
Glucose-Capillary: 163 mg/dL — ABNORMAL HIGH (ref 70–99)
Glucose-Capillary: 173 mg/dL — ABNORMAL HIGH (ref 70–99)

## 2019-06-05 LAB — PHOSPHORUS: Phosphorus: 4.5 mg/dL (ref 2.5–4.6)

## 2019-06-05 LAB — MAGNESIUM: Magnesium: 2 mg/dL (ref 1.7–2.4)

## 2019-06-05 MED ORDER — TRAVASOL 10 % IV SOLN
INTRAVENOUS | Status: AC
  Filled 2019-06-05: qty 1080

## 2019-06-05 NOTE — Progress Notes (Addendum)
PHARMACY - TOTAL PARENTERAL NUTRITION CONSULT NOTE   Indication: Fistula  Patient Measurements: Height: 5\' 2"  (157.5 cm) Weight: 121 lb 4.1 oz (55 kg) IBW/kg (Calculated) : 50.1 TPN AdjBW (KG): 54.6 Body mass index is 22.18 kg/m.  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  Significant history of DM with bilateral BKA.  Presenting from SNF with MRSA bacteremia, suspected source from PICC.  Has been on oral diet this admission with line holiday however decreased appetite and may not be absorbing much per RD, unable to meet requirements.  Moderate malnutrition of chronic disease per RD assessment.     Glucose / Insulin: History of DM and requirement of insulin in past TPNs. Previously this admission on D10 to maintain euglycemia before TPN start. Current CBGs 160-170s, required 6 units of Novolog insulin in last 24 hours. Insulin requirement is not high enough to justify insulin in TPN.  Electrolytes: Na low at 131, K 4.9, CO2 23, Mag 2, Phos 4.5, and CoCa 10.7.  Renal: SCr 0.87- stable. BUN up 62.  LFTs / TGs: LFTs/Tbili within normal limits. TG 181>101.  Prealbumin / albumin: Prealbumin 6/ Albumin 1.4 Intake / Output; MIVF: UOP 1.6 mL/kg/hr. Colostomy output 225 mL - on Imodium 2mg  every 8 hours. JP drain 0 mL.  GI Imaging: none since start of TPN Surgeries / Procedures: none since start of TPN  Central access: PICC replaced 05/29/19 TPN start date: 05/29/19  Nutritional Goals (per RD recommendation on 3/18): kCal: 1800-2000, Protein: 83-110g, Fluid: >= 1.9L Goal TPN rate is 75 mL/hr (provides 108 g of protein and 1819 kcals per day)  Current Nutrition:   Soft diet - Dysphagia 2 --intake 0-25% of meals (starting to increase)   Plan:  Continue custom TPN to goal rate of 75 mL/hr at 1800. Electrolytes in TPN: 60 mEq/L of Na, 18 mEq/L of K (32 mEq/day), 2.5 mEq/L of Ca, 6 mEq/L of Mg, and decrease to 7.5 mmol/L of  Phos. Cl: Acetate 1:2. Patient receiving oral MVI which contains trace - will not add to TPN.   Continue coverage with Moderate q6h SSI and adjust as needed. TPN labs Mon/Thurs.   Monitor po intake (starting to increase) and plan for TPN long-term/discharge.  If CBGs remain controlled, consider transition to cyclic TPN pending long-term plans.  Addendum: Surgery clarified plans-- will start transition to cyclic TPN on 3/32,  Sloan Leiter, PharmD, BCPS, BCCCP Clinical Pharmacist Please refer to [facility] for [place] numbers 06/05/2019, 7:02 AM

## 2019-06-05 NOTE — TOC Progression Note (Signed)
Transition of Care [facility]) - Progression Note    Patient Details  Name: Gina [name] MRN: [id number] Date of Birth: [birth year]  Transition of Care [facility]) CM/SW Contact  Maryclare Labrador, RN Phone Number: 06/05/2019, 1:02 PM  Clinical Narrative:   [facility] has declined pt due to cost of TPN (per agency approximately $3000 per Day) and agency does not feel that pt has a hospice appropriate dx.   [facility] to follow up with [facility] as estimated cost of daily TPN will not exceed $100 - [facility] to review verified cost of TPN with leadership. Update:  [facility] continues to decline pt.   CM also reviewed list of [facility] friendly agencies sent from [facility],   Results from [facility] referrals 3/25 [facility] - not in [place] - only Diplomatic Services operational officer and aides [employer] [place] - can not accept TPN pts [facility] - unable to reach   [facility] text paged attending to discuss next course of action   Expected Discharge Plan: Skilled Nursing Facility(Definite plan not decided yet by patient/adult children. It is between returning to SNF or going home with services (possibly PCS/HH)) Barriers to Discharge: Continued Medical Work up  Expected Discharge Plan and Services Expected Discharge Plan: Skilled Nursing Facility(Definite plan not decided yet by patient/adult children. It is between returning to SNF or going home with services (possibly PCS/HH))                                               Social Determinants of Health (SDOH) Interventions    Readmission Risk Interventions Readmission Risk Prevention Plan 02/10/2019 11/07/2017  Post Dischage Appt - Patient refused  Medication Screening - Complete  Transportation Screening Complete Complete  PCP follow-up - Patient refused  PCP or Specialist Appt within 3-5 Days Not Complete -  Not Complete comments DC date unknown- pt established with PCP -  [place] or [place] Complete -  Social Work Consult for [place]  Planning/Counseling Not Complete -  SW consult not completed comments awaiting call back from daughter and APS -  Palliative Care Screening Not Complete -  Palliative Care Screening Not Complete Comments pending need -  Medication Review ([place]) Referral to Pharmacy -  Some recent data might be hidden

## 2019-06-05 NOTE — Progress Notes (Signed)
Nutrition Follow-up  DOCUMENTATION CODES:   Non-severe (moderate) malnutrition in context of chronic illness  INTERVENTION:   Change diet back to regular per pt request  -TPN to meet 100% of needs- plan to start cycling tomorrow    -Continue with dysphagia 2 diet for comfort  NUTRITION DIAGNOSIS:   Moderate Malnutrition related to chronic illness(EC fistula) as evidenced by mild fat depletion, moderate fat depletion, mild muscle depletion, moderate muscle depletion.  Ongoing  GOAL:   Patient will meet greater than or equal to 90% of their needs  Not meeting    MONITOR:   PO intake, Supplement acceptance, Labs, Weight trends, Skin, I & O's  REASON FOR ASSESSMENT:   Consult New TPN/TNA  ASSESSMENT:   59 yo female presents with malaise from [facility] and admitted with MRSA bacteremia. Pt admitted 01/2019 with septic shock and found to have acute peritonitis and underwent ex lap with partial SB resection and intra-abdominal abscess. Pt with SB EC fistula on TPNPMH includes DM, HTN, bilateral BKA, schizophrenia.  3/11- NGT removed 3/12- per general surgery notes, UGI reveals 2 separate EC fistulae 3/13- s/p MBSS- advanced to regular diet with thin liquids; JP drain d/c  3/16- s/p TEE- no evidence of endocarditis 3/18- s/p PICC replacement, TPN start   Pt reports she dislikes DYS 2 diet and wants her "regular food" back. RD to change diet order. Intake remains inadequate. Meal completions charted as 0-25% for her last eight meals.   TPN running at 75 ml/r to provide 108 grams protein and 1819 kcal daily. For d/c with home health on TPN. Plan to start cycling TPN tomorrow to prepare.   Admission weight: 54.6 kg  Current weight: 55 kg   I/O: -5,374 ml since admit  UOP: 2,100 ml x 24 hrs  Colostomy: 125 ml x 24 hrs   Medications: SS novolog, imodium, MVI with minerals  Labs: Na 131 (L) CBG 95-173  Diet Order:   Diet Order            DIET DYS 2 Room service  appropriate? Yes with Assist; Fluid consistency: Thin  Diet effective now              EDUCATION NEEDS:   Education needs have been addressed  Skin:  Skin Assessment: Skin Integrity Issues: Skin Integrity Issues:: Stage II, Unstageable Stage II: lt leg x 2, coccyx Unstageable: b/l BKA stumps Other: Midline, lower abdominal EC fistula  Last BM:  3/22- via colostomy  Height:   Ht Readings from Last 1 Encounters:  05/27/19 5\' 2"  (1.575 m)    Weight:   Wt Readings from Last 1 Encounters:  06/02/19 55 kg   BMI:  Body mass index is 22.18 kg/m.  Estimated Nutritional Needs:   Kcal:  1800-2000 kcals  Protein:  83-110 g  Fluid:  >/= 1.9 L   Mariana Single RD, LDN Clinical Nutrition Pager listed in [place]

## 2019-06-05 NOTE — Progress Notes (Signed)
PROGRESS NOTE    Gina [name]  LOV:[id number] DOB: 1960/08/18 DOA: 05/22/2019 PCP: [name], MD   Brief Narrative: 59 year old female with DM, HTN, schizophrenia, bilateral BKA sent from [facility] due to positive blood culture. She was recently admitted  on November 2020 for perforated prepyloric ulcer with septic shock with acute peritonitis status post exlap with [name] patch on 11/23, and was discharged to [facility] on Abx and antifungal for total 6 weeks. Patient was discharged with Foley catheter, PICC line, bilateral lower quadrant drains along with enterocutaneous fistula with colostomy bag . She was discharged from [place] to [facility] whiere she has been living for last 3 months. Patient was sent to ER for vague sleepiness, malaise ,anorexia low-grade fever and leukocytosis at SNF, and in the ER exam labs were  unremarkable and patient was sent back to SNF but overnight blood culture showed MRSA and she was readmitted to the hospital. Patient is being followed by infectious disease, general surgery.She did well with speech, diet was restarted.Foley catheter was exchanged. Repeat blood culture negative from 3/2. ID has advised to continue vancomycin through 3/25 and ordered PICC line.Surgery has been following patient and on calorie count,  Patient and family planning to return home with home health services upon discharge and has declined skilled nursing facility placement.  She has been started back on TPN.  She was febrile last weekend.  Afebrile since last 3 days.  General surgery following. General surgery has cleared for discharge.  Patient and daughter want home health.  She needs TPN at home.  She is medically stable for discharge as soon as home health and other requirements arranged.  Assessment & Plan:   Principal Problem:   MRSA bacteremia Active Problems:   HTN (hypertension)   Diabetes ([place])   Severe protein-calorie malnutrition ([place])   Anemia of  chronic disease   Type 2 diabetes mellitus with hyperosmolar nonketotic hyperglycemia (HCC)   Pressure injury of right thigh, unstageable (HCC)   Below-knee amputee ([place])   Atherosclerosis of native arteries of extremities with gangrene, right leg (HCC)   Intra-abdominal fluid collection   Palliative care by specialist   Goals of care, counseling/discussion   DNR (do not resuscitate)   Physical deconditioning   Dysphagia   Malnutrition of moderate degree  MSSA bacteremia: Indwelling PICC line used for TPN was felt to be the likely source, PICC line was removed.  Repeat blood cultures have been negative.  New PICC line placed on 3/18.  Plan is to continue vancomycin through 3/25.Today is the last day.  ID was following Had febrile episodes on weekend.  Afebrile for last 3 days.  New cultures have been sent, so far negative.  Chest x-ray did not show any pneumonia. If she becomes persistently febrile, will plan for CT abdomen.  Intra-abdominal fluid collection: Patient with history of exploratory laparotomy/partial small bowel resection for intra-abdominal abscess in November.  She has Eakins pouch and LLQ drain. She was admitted in November 2020 for perforated prepyloric ulcer with septic shock, acute peritonitis.  Status post exploratory laparotomy with [name] patch. She had 2 separate enterocutaneous fistula.  She might need injection studies to further characterize fistulas in the future as well as EGD.  Moderate protein calorie malnutrition: Dietitian following.  Currently on TPN.Also on oral diet.  Currently having loose stools.  On Imodium  Hypertension: Currently blood pressure well controlled.  On amlodipine  Diabetes type 2: Hemoglobin A1c of 5.2 as per  3/12.  Currently  on sliding scale insulin.  Normocytic anemia: Has been transfused with several units of PRBC during the hospitalization.  Currently hemoglobin stable  GERD: Continue PPI  Mood disorder: Continue Celexa  Goals  of care: Patient is DNR.  Palliative care was following.  Recommended to follow-up as an outpatient.  Pressure Injury 02/03/19 Coccyx Right;Left;Medial Unstageable - Full thickness tissue loss in which the base of the ulcer is covered by slough (yellow, tan, gray, green or brown) and/or eschar (tan, brown or black) in the wound bed. pt has 3 small injuri (Active)  02/03/19 0230  Location: Coccyx  Location Orientation: Right;Left;Medial  Staging: Unstageable - Full thickness tissue loss in which the base of the ulcer is covered by slough (yellow, tan, gray, green or brown) and/or eschar (tan, brown or black) in the wound bed.  Wound Description (Comments): pt has 3 small injuries all in the same area, one is medial and two are on either side of it  Present on Admission: Yes     Pressure Injury 05/22/19 Coccyx Bilateral Stage 2 -  Partial thickness loss of dermis presenting as a shallow open injury with a red, pink wound bed without slough. (Active)  05/22/19 2104  Location: Coccyx  Location Orientation: Bilateral  Staging: Stage 2 -  Partial thickness loss of dermis presenting as a shallow open injury with a red, pink wound bed without slough.  Wound Description (Comments):   Present on Admission: Yes     Pressure Injury 05/22/19 Knee Right;Distal Unstageable - Full thickness tissue loss in which the base of the injury is covered by slough (yellow, tan, gray, green or brown) and/or eschar (tan, brown or black) in the wound bed. Stump (Active)  05/22/19 2106  Location: Knee  Location Orientation: Right;Distal  Staging: Unstageable - Full thickness tissue loss in which the base of the injury is covered by slough (yellow, tan, gray, green or brown) and/or eschar (tan, brown or black) in the wound bed.  Wound Description (Comments): Stump  Present on Admission: Yes     Pressure Injury 05/22/19 Knee Left;Distal Unstageable - Full thickness tissue loss in which the base of the injury is covered by  slough (yellow, tan, gray, green or brown) and/or eschar (tan, brown or black) in the wound bed. Stump (Active)  05/22/19 2108  Location: Knee  Location Orientation: Left;Distal  Staging: Unstageable - Full thickness tissue loss in which the base of the injury is covered by slough (yellow, tan, gray, green or brown) and/or eschar (tan, brown or black) in the wound bed.  Wound Description (Comments): Stump  Present on Admission: Yes     Pressure Injury 05/22/19 Thigh Left;Mid Stage 2 -  Partial thickness loss of dermis presenting as a shallow open injury with a red, pink wound bed without slough. (Active)  05/22/19 2109  Location: Thigh  Location Orientation: Left;Mid  Staging: Stage 2 -  Partial thickness loss of dermis presenting as a shallow open injury with a red, pink wound bed without slough.  Wound Description (Comments):   Present on Admission: Yes         Nutrition Problem: Moderate Malnutrition Etiology: chronic illness(EC fistula)      DVT prophylaxis:Lovenox Code Status: DNR Family Communication: Called daughter and discussed on the phone on 06/03/2019 Disposition Plan: Patient is from skilled nursing facility.  PT/OT recommending skilled nursing facility.   Patient and her daughter deny skilled nursing facility and want to go home with home health.  She is medically stable  for discharge from medical perspective.  Waiting for home health arrangement.  Consultants: General surgery, ID  Procedures:As above  Antimicrobials:  Anti-infectives (From admission, onward)   Start     Dose/Rate Route Frequency Ordered Stop   06/01/19 1200  vancomycin (VANCOCIN) IVPB 1000 mg/200 mL premix  Status:  Discontinued     1,000 mg 200 mL/hr over 60 Minutes Intravenous Every 48 hours 05/31/19 2352 06/01/19 0813   06/01/19 1200  vancomycin (VANCOREADY) IVPB 1250 mg/250 mL     1,250 mg 166.7 mL/hr over 90 Minutes Intravenous Every 48 hours 06/01/19 0813 06/05/19 2359   05/31/19 1438   vancomycin variable dose per unstable renal function (pharmacist dosing)  Status:  Discontinued      Does not apply See admin instructions 05/31/19 1438 05/31/19 2351   05/25/19 2200  vancomycin (VANCOCIN) IVPB 1000 mg/200 mL premix  Status:  Discontinued     1,000 mg 200 mL/hr over 60 Minutes Intravenous Every 24 hours 05/25/19 0958 05/31/19 1438   05/24/19 2200  vancomycin (VANCOREADY) IVPB 750 mg/150 mL  Status:  Discontinued     750 mg 150 mL/hr over 60 Minutes Intravenous Every 24 hours 05/24/19 1156 05/25/19 0958   05/23/19 2200  vancomycin (VANCOCIN) IVPB 1000 mg/200 mL premix     1,000 mg 200 mL/hr over 60 Minutes Intravenous  Once 05/23/19 1345 05/24/19 0008   05/23/19 1113  vancomycin variable dose per unstable renal function (pharmacist dosing)  Status:  Discontinued      Does not apply See admin instructions 05/23/19 1114 05/24/19 1156   05/22/19 1500  vancomycin (VANCOREADY) IVPB 1250 mg/250 mL  Status:  Discontinued     1,250 mg 166.7 mL/hr over 90 Minutes Intravenous Every 24 hours 05/22/19 1429 05/23/19 1114      Subjective: Patient seen and examined at the bedside this morning.  Hemodynamically stable.  Denies any abdominal pain, nausea or vomiting.  No new changes from yesterday.   Objective: Vitals:   06/04/19 0940 06/04/19 1704 06/04/19 2131 06/05/19 0415  BP: 128/61 119/65 129/61 131/61  Pulse: 71 70 70 71  Resp: 14 16 18 18   Temp: 98.1 F (36.7 C) 98 F (36.7 C) 98.1 F (36.7 C) 97.6 F (36.4 C)  TempSrc:  Oral Oral Oral  SpO2: 96% 94% 98% 100%  Weight:      Height:        Intake/Output Summary (Last 24 hours) at 06/05/2019 0810 Last data filed at 06/05/2019 0601 Gross per 24 hour  Intake 1002.58 ml  Output 1625 ml  Net -622.42 ml   Filed Weights   05/29/19 2128 05/30/19 2058 06/02/19 2124  Weight: 60 kg 61 kg 55 kg    Examination:  General exam: Chronically ill looking, frail, cachectic, malnourished.Foul smell in the room Respiratory  system: Bilateral equal air entry, normal vesicular breath sounds, no wheezes or crackles  Cardiovascular system: S1 & S2 heard, RRR. No JVD, murmurs, rubs, gallops or clicks. Gastrointestinal system: Abdomen is nondistended, soft and nontender.  Eakin's pouch  with brown liquid stool, drain on the left lower quadrant. Central nervous system: Alert and oriented. No focal neurological deficits. Extremities:Bilateral BKA Skin: Minor skin breakdowns  Data Reviewed: I have personally reviewed following labs and imaging studies  CBC: Recent Labs  Lab 05/29/19 1003 05/30/19 0903 05/31/19 1020 06/02/19 0415  WBC 8.2 12.1* 9.6 9.4  NEUTROABS 5.5  --  6.1 7.8*  HGB 6.7* 10.2* 9.9* 8.9*  HCT 21.6* 31.7* 30.6* 28.3*  MCV 96.4 91.1 91.3 94.3  PLT 397 393 367 585   Basic Metabolic Panel: Recent Labs  Lab 05/31/19 1020 06/01/19 0317 06/02/19 0415 06/03/19 0412 06/05/19 0323  NA 131* 132* 130* 131* 131*  K 4.8 4.6 5.0 4.9 4.9  CL 106 108 105 105 103  CO2 20* 19* 19* 21* 23  GLUCOSE 178* 151* 185* 90 186*  BUN 28* 32* 45* 57* 62*  CREATININE 0.83 0.63 0.82 0.88 0.87  CALCIUM 8.2* 8.2* 8.5* 8.5* 8.6*  MG 1.9 1.8 2.1 2.1 2.0  PHOS 3.3 3.8 4.4 4.5 4.5   GFR: Estimated Creatinine Clearance: 55.7 mL/min (by C-G formula based on SCr of 0.87 mg/dL). Liver Function Tests: Recent Labs  Lab 05/29/19 1003 05/30/19 0407 06/02/19 0415 06/05/19 0323  AST 14* 12* 10* 17  ALT 15 14 9 16   ALKPHOS 128* 123 104 150*  BILITOT 0.8 0.7 0.6 0.5  PROT 6.5 6.6 6.1* 6.5  ALBUMIN 1.5* 1.5* 1.3* 1.4*   No results for input(s): LIPASE, AMYLASE in the last 168 hours. No results for input(s): AMMONIA in the last 168 hours. Coagulation Profile: No results for input(s): INR, PROTIME in the last 168 hours. Cardiac Enzymes: No results for input(s): CKTOTAL, CKMB, CKMBINDEX, TROPONINI in the last 168 hours. BNP (last 3 results) No results for input(s): PROBNP in the last 8760 hours. HbA1C: No results  for input(s): HGBA1C in the last 72 hours. CBG: Recent Labs  Lab 06/04/19 0700 06/04/19 1144 06/04/19 1700 06/05/19 0056 06/05/19 0641  GLUCAP 95 115* 106* 173* 163*   Lipid Profile: No results for input(s): CHOL, HDL, LDLCALC, TRIG, CHOLHDL, LDLDIRECT in the last 72 hours. Thyroid Function Tests: No results for input(s): TSH, T4TOTAL, FREET4, T3FREE, THYROIDAB in the last 72 hours. Anemia Panel: No results for input(s): VITAMINB12, FOLATE, FERRITIN, TIBC, IRON, RETICCTPCT in the last 72 hours. Sepsis Labs: No results for input(s): PROCALCITON, LATICACIDVEN in the last 168 hours.  Recent Results (from the past 240 hour(s))  Culture, blood (routine x 2)     Status: None (Preliminary result)   Collection Time: 06/01/19  9:13 AM   Specimen: BLOOD RIGHT HAND  Result Value Ref Range Status   Specimen Description BLOOD RIGHT HAND  Final   Special Requests   Final    BOTTLES DRAWN AEROBIC ONLY Blood Culture adequate volume   Culture   Final    NO GROWTH 4 DAYS Performed at [facility], [address], [place], [place] [address]    Report Status PENDING  Incomplete  Culture, blood (routine x 2)     Status: None (Preliminary result)   Collection Time: 06/01/19  9:13 AM   Specimen: BLOOD  Result Value Ref Range Status   Specimen Description BLOOD LEFT ANTECUBITAL  Final   Special Requests   Final    BOTTLES DRAWN AEROBIC ONLY Blood Culture results may not be optimal due to an inadequate volume of blood received in culture bottles   Culture   Final    NO GROWTH 4 DAYS Performed at [facility], [place] [address], [place], [place] [address]    Report Status PENDING  Incomplete         Radiology Studies: No results found.      Scheduled Meds: . amLODipine  5 mg Oral Daily  . chlorhexidine  15 mL Mouth Rinse BID  . Chlorhexidine Gluconate Cloth  6 each Topical Daily  . enoxaparin (LOVENOX) injection  40 mg Subcutaneous Q24H  .  escitalopram  10 mg Oral Daily    . gabapentin  300 mg Oral BID  . insulin aspart  0-15 Units Subcutaneous Q6H  . loperamide  2 mg Oral Q8H  . mouth rinse  15 mL Mouth Rinse q12n4p  . melatonin  3 mg Oral QHS  . metoprolol tartrate  50 mg Oral BID  . multivitamin with minerals  1 tablet Oral Daily  . pantoprazole  40 mg Oral Daily  . simethicone  80 mg Oral QID   Continuous Infusions: . TPN ADULT (ION) 75 mL/hr at 06/04/19 1900  . TPN ADULT (ION)    . vancomycin 1,250 mg (06/03/19 1520)     LOS: 14 days    Time spent: 25 mins, More than 50% of that time was spent in counseling and/or coordination of care.      Shelly Coss, MD [facility] P3/25/2021, 8:10 AM

## 2019-06-05 NOTE — Progress Notes (Signed)
Occupational Therapy Treatment Patient Details Name: Gina [name] MRN: [id number] DOB: February 28, 1961 Today's Date: 06/05/2019    History of present illness Pt is a 59 y/o female admitted from SNF secondary to abnormal lab values. Found to have MRSA bacteremia. Pt also with abdominal fistula and multiple abdominal drains secondary to previous complicated hospital admission. Pt now s/p 1 unit PRBC 3/18. PMH includes HTN, DM, bilateral BKA, and schizophrenia.   OT comments  Pt willing to sit EOB, but not able to participate in ADL in sitting due to decreased balance without UE support. Pt requiring max to total assist for bed mobility and demonstrated decreased tolerance of sitting due to pain in buttocks and LEs when in dependent position. Educated in importance of pressure relief by rolling, but pt without adequate strength to perform herself.  Pt given large crayons and set up for coloring and word puzzles at bed level. Continue to recommend SNF, but pt adamant she wants to go home.  Follow Up Recommendations  SNF;Supervision/Assistance - 24 hour(pt wants to go home)    Equipment Recommendations  Hospital bed(per pt, old hosp mattress has water damage, needs hoyer lift)    Recommendations for Other Services      Precautions / Restrictions Precautions Precautions: Fall Precaution Comments: bilateral BKA  Restrictions Weight Bearing Restrictions: No       Mobility Bed Mobility Overal bed mobility: Needs Assistance Bed Mobility: Rolling;Sidelying to Sit;Sit to Supine Rolling: Max assist Sidelying to sit: Max assist;HOB elevated   Sit to supine: Total assist      Transfers                 General transfer comment: deferred this visit, pt will need a hoyer lift for home    Balance Overall balance assessment: Needs assistance Sitting-balance support: Bilateral upper extremity supported Sitting balance-Leahy Scale: Fair Sitting balance - Comments: reliant on B UEs for  support at EOB Postural control: Posterior lean                                 ADL either performed or assessed with clinical judgement   ADL                                         General ADL Comments: pt unable to perform ADL in sitting at EOB due to poor sitting balance, gave pt larger diameter crayons and set up for her to color and do word searches     Vision       Perception     Praxis      Cognition Arousal/Alertness: Awake/alert Behavior During Therapy: WFL for tasks assessed/performed Overall Cognitive Status: No family/caregiver present to determine baseline cognitive functioning                                 General Comments: pt wants to go home, not considering the taxing effort it will require of her family        Exercises Other Exercises Other Exercises: educated in importance of pressure relief on buttocks in bed   Shoulder Instructions       General Comments      Pertinent Vitals/ Pain       Pain Assessment: Faces Faces Pain Scale: Hurts little  more Pain Location: LEs, buttocks during mobility Pain Descriptors / Indicators: Grimacing;Guarding;Crying;Discomfort;Sore Pain Intervention(s): Patient requesting pain meds-RN notified;Monitored during session  Home Living                                          Prior Functioning/Environment              Frequency  Min 2X/week        Progress Toward Goals  OT Goals(current goals can now be found in the care plan section)  Progress towards OT goals: Not progressing toward goals - comment(pt with significant weakness)  Acute Rehab OT Goals Patient Stated Goal: wants to go home OT Goal Formulation: With patient Time For Goal Achievement: 06/12/19 Potential to Achieve Goals: [place] Discharge plan remains appropriate    Co-evaluation                 AM-PAC OT "6 Clicks" Daily Activity     Outcome Measure    Help from another person eating meals?: A Little Help from another person taking care of personal grooming?: A Little Help from another person toileting, which includes using toliet, bedpan, or urinal?: Total Help from another person bathing (including washing, rinsing, drying)?: A Lot Help from another person to put on and taking off regular upper body clothing?: A Lot Help from another person to put on and taking off regular lower body clothing?: Total 6 Click Score: 12    End of Session    OT Visit Diagnosis: Muscle weakness (generalized) (M62.81);Pain;Other abnormalities of gait and mobility (R26.89)   Activity Tolerance Patient tolerated treatment well   Patient Left in bed;with call bell/phone within reach   Nurse Communication          Time: 1610-9604 OT Time Calculation (min): 21 min  Charges: OT General Charges $OT Visit: 1 Visit OT Treatments $Therapeutic Activity: 8-22 mins  Nestor Lewandowsky, OTR/L Acute Rehabilitation Services Pager: [phone] Office: [phone]   Malka So 06/05/2019, 11:36 AM

## 2019-06-06 LAB — GLUCOSE, CAPILLARY
Glucose-Capillary: 148 mg/dL — ABNORMAL HIGH (ref 70–99)
Glucose-Capillary: 111 mg/dL — ABNORMAL HIGH (ref 70–99)
Glucose-Capillary: 151 mg/dL — ABNORMAL HIGH (ref 70–99)
Glucose-Capillary: 95 mg/dL (ref 70–99)
Glucose-Capillary: 182 mg/dL — ABNORMAL HIGH (ref 70–99)

## 2019-06-06 LAB — CULTURE, BLOOD (ROUTINE X 2)
Culture: NO GROWTH
Culture: NO GROWTH
Special Requests: ADEQUATE

## 2019-06-06 MED ORDER — TRAVASOL 10 % IV SOLN
INTRAVENOUS | Status: AC
  Filled 2019-06-06: qty 1080

## 2019-06-06 NOTE — Progress Notes (Addendum)
[facility] Progress Note  10 Days Post-Op  Subjective: CC-  Alert today. AFVSS. Not taking in much PO. ECF output 900 cc/24h thin and brown  Objective: Vital signs in last 24 hours: Temp:  [97.8 F (36.6 C)-99.6 F (37.6 C)] 98.6 F (37 C) (03/26 0356) Pulse Rate:  [75-78] 77 (03/26 0356) Resp:  [16-18] 16 (03/26 0356) BP: (107-139)/(54-69) 107/54 (03/26 0356) SpO2:  [92 %-97 %] 93 % (03/26 0356) Weight:  [61 kg] 61 kg (03/25 2137) Last BM Date: 06/02/19  Intake/Output from previous day: 03/25 0701 - 03/26 0700 In: 1538 [P.O.:610; I.V.:928] Out: 3250 [Urine:2350; Stool:900] Intake/Output this shift: Total I/O In: 120 [P.O.:120] Out: -   PE: Gen: Alert, NAD Pulm: rate and effort normal OEU:MPNT, nontender, nondistended, Eakins in place over fistula with GI contents in bag, IR drainalso with enteric contents (not quantified in Epic) Ext:S/p BKA Skin: warm and dry  Lab Results:  No results for input(s): WBC, HGB, HCT, PLT in the last 72 hours. BMET Recent Labs    06/05/19 0323  NA 131*  K 4.9  CL 103  CO2 23  GLUCOSE 186*  BUN 62*  CREATININE 0.87  CALCIUM 8.6*   PT/INR No results for input(s): LABPROT, INR in the last 72 hours. CMP     Component Value Date/Time   NA 131 (L) 06/05/2019 0323   NA 139 10/04/2017 1705   K 4.9 06/05/2019 0323   CL 103 06/05/2019 0323   CO2 23 06/05/2019 0323   GLUCOSE 186 (H) 06/05/2019 0323   BUN 62 (H) 06/05/2019 0323   BUN 11 10/04/2017 1705   CREATININE 0.87 06/05/2019 0323   CREATININE 0.68 04/28/2016 1640   CALCIUM 8.6 (L) 06/05/2019 0323   PROT 6.5 06/05/2019 0323   PROT 7.1 10/04/2017 1705   ALBUMIN 1.4 (L) 06/05/2019 0323   ALBUMIN 3.7 10/04/2017 1705   AST 17 06/05/2019 0323   ALT 16 06/05/2019 0323   ALKPHOS 150 (H) 06/05/2019 0323   BILITOT 0.5 06/05/2019 0323   BILITOT <0.2 10/04/2017 1705   GFRNONAA >60 06/05/2019 0323   GFRNONAA >89 04/28/2016 1640   GFRAA >60 06/05/2019 0323    GFRAA >89 04/28/2016 1640   Lipase     Component Value Date/Time   LIPASE 27 04/01/2016 1040       Studies/Results: No results found.  Anti-infectives: Anti-infectives (From admission, onward)   Start     Dose/Rate Route Frequency Ordered Stop   06/01/19 1200  vancomycin (VANCOCIN) IVPB 1000 mg/200 mL premix  Status:  Discontinued     1,000 mg 200 mL/hr over 60 Minutes Intravenous Every 48 hours 05/31/19 2352 06/01/19 0813   06/01/19 1200  vancomycin (VANCOREADY) IVPB 1250 mg/250 mL     1,250 mg 166.7 mL/hr over 90 Minutes Intravenous Every 48 hours 06/01/19 0813 06/05/19 1401   05/31/19 1438  vancomycin variable dose per unstable renal function (pharmacist dosing)  Status:  Discontinued      Does not apply See admin instructions 05/31/19 1438 05/31/19 2351   05/25/19 2200  vancomycin (VANCOCIN) IVPB 1000 mg/200 mL premix  Status:  Discontinued     1,000 mg 200 mL/hr over 60 Minutes Intravenous Every 24 hours 05/25/19 0958 05/31/19 1438   05/24/19 2200  vancomycin (VANCOREADY) IVPB 750 mg/150 mL  Status:  Discontinued     750 mg 150 mL/hr over 60 Minutes Intravenous Every 24 hours 05/24/19 1156 05/25/19 0958   05/23/19 2200  vancomycin (VANCOCIN) IVPB 1000 mg/200  mL premix     1,000 mg 200 mL/hr over 60 Minutes Intravenous  Once 05/23/19 1345 05/24/19 0008   05/23/19 1113  vancomycin variable dose per unstable renal function (pharmacist dosing)  Status:  Discontinued      Does not apply See admin instructions 05/23/19 1114 05/24/19 1156   05/22/19 1500  vancomycin (VANCOREADY) IVPB 1250 mg/250 mL  Status:  Discontinued     1,250 mg 166.7 mL/hr over 90 Minutes Intravenous Every 24 hours 05/22/19 1429 05/23/19 1114       Assessment/Plan HTN DM Schizophrenia Stress urinary incontinence Bilateral BKAs Left middle finger amputation Severe malnutrition - prealbumin6 (3/22) Code status DNR  MRSA bacteremia - per primary, PICC line out, completed vancomycin therapy  per ID recs  Perforated prepyloric ulcer S/pEXPLORATORY LAPAROTOMY WITH OVERSEW OF DUODENAL ULCER with [name] patch11/23/2020 Dr. Lucia Gaskins [facility] Fistula -JP surgical drainfell out3/14/21and 1 IR drain placed 12/16/20in place with enteric contents  - UGI3/12showed2 separate enterocutaneous fistulae - may need drain injection studies to further characterize fistulas in the future, as well as EGD - continue strict I&O's of output from ECF and drain.  - continue imodium   ID - vancomycin 3/11-3/25 FEN - Dysphagia 2 diet, TPN VTE -SCDs, lovenox Foley -in place Follow up - TBD  Plan:  Medically stable for discharge, await [place] arrangements per primary team. HH needs: PICC, TPN and eakins pouch and drain.    LOS: 15 days   [facility] 06/06/2019, 9:48 AM Please see Amion for pager number during day hours 7:00am-4:30pm

## 2019-06-06 NOTE — Progress Notes (Signed)
PHARMACY - TOTAL PARENTERAL NUTRITION CONSULT NOTE   Indication: Fistula  Patient Measurements: Height: 5\' 2"  (157.5 cm) Weight: 134 lb 7.7 oz (61 kg) IBW/kg (Calculated) : 50.1 TPN AdjBW (KG): 54.6 Body mass index is 24.6 kg/m.  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  Significant history of DM with bilateral BKA.  Presenting from SNF with MRSA bacteremia, suspected source from PICC.  Has been on oral diet this admission with line holiday however decreased appetite and may not be absorbing much per RD, unable to meet requirements.  Moderate malnutrition of chronic disease per RD assessment.     Glucose / Insulin: History of DM and requirement of insulin in past TPNs. Previously this admission on D10 to maintain euglycemia before TPN start. Current CBGs 103-127, required 4 units of Novolog insulin in last 24 hours. Insulin requirement is not high enough to justify insulin in TPN.  Electrolytes: No labs 3/26; Na low at 131, K 4.9, CO2 23, Mag 2, Phos 4.5, and CoCa 10.7.  Renal: SCr 0.87- stable. BUN up 62.  LFTs / TGs: LFTs/Tbili within normal limits. TG 181>101.  Prealbumin / albumin: Prealbumin 6/ Albumin 1.4 Intake / Output; MIVF: UOP 1.6 mL/kg/hr. Colostomy output 900 mL - on Imodium 2mg  every 8 hours. JP drain 0 mL.  GI Imaging: none since start of TPN Surgeries / Procedures: none since start of TPN  Central access: PICC replaced 05/29/19 TPN start date: 05/29/19  Nutritional Goals (per RD recommendation on 3/18): kCal: 1800-2000, Protein: 83-110g, Fluid: >= 1.9L Goal TPN rate is 75 mL/hr (provides 108 g of protein and 1819 kcals per day)  Current Nutrition:   Soft diet - Dysphagia 2 --intake 0-25% of meals (starting to increase) Want to start cycling TPN for discharge home    Plan:  Change to cyclic TPN over 16 hours - rate of 60 mL/hr x 1 hour, the 120 ml/hr x 14 hrs, then 60 ml/hr x 1 hour then  off. Electrolytes in TPN: 60 mEq/L of Na, 18 mEq/L of K (32 mEq/day), 2.5 mEq/L of Ca, 6 mEq/L of Mg, and decrease to 7.5 mmol/L of Phos. Cl: Acetate 1:2. Patient receiving oral MVI which contains trace - will not add to TPN.  Continue coverage with Moderate q6h SSI and adjust as needed. TPN labs Mon/Thurs.   Monitor po intake (starting to increase) and plan for TPN long-term/discharge.    Alanda Slim, PharmD, [facility] Clinical Pharmacist Please see AMION for all Pharmacists' Contact Phone Numbers 06/06/2019, 8:20 AM

## 2019-06-06 NOTE — TOC Progression Note (Addendum)
Transition of Care [facility]) - Progression Note    Patient Details  Name: Gina [name] MRN: [id number] Date of Birth: 09/30/1960  Transition of Care [facility]) CM/SW Contact  Sharlet Salina Mila Homer, LCSW Phone Number: 06/06/2019, 12:33 PM  Clinical Narrative:  CSW and nurse case manager talked with daughter [name] by phone regarding unsuccessful attempts to find a [facility] agency that can provide RN, PT, OT. Patient is a CNA and was advised by nurse case manager that she can come to hospital and get training on changing the Eakins pouch and daughter agreeable to learning. Daughter also agreeable to patient still discharging home with PCS services and family supplementing the care.  [name] added that her aunt will be able to assist with some of the care-giving.  Daughter sent FMLA paperwork to [place] on 3/25, MD advised and paperwork faxed to [facility] Dept [phone]) per MD's request.   Call made to [facility] with [facility] and confirmed that patient has been approved for [facility] services. They sent the notification to patient/family's first choice ([facility]) and they need to respond by selecting accepting or rejecting request for [facility] services.   Call made to [place] - [phone]) and talked with [facility]. She has the patient's information and will call the daughter [name]. Confirmed with [name] daughter's contact information. CSW will continue to follow.        Expected Discharge Plan: Skilled Nursing Facility(Definite plan not decided yet by patient/adult children. It is between returning to SNF or going home with services (possibly PCS/HH)) Barriers to Discharge: Continued Medical Work up  Expected Discharge Plan and Services Expected Discharge Plan: Skilled Nursing Facility(Definite plan not decided yet by patient/adult children. It is between returning to SNF or going home with services (possibly PCS/HH))                                             Social Determinants of  Health (SDOH) Interventions  No SDOH interventions needed or requested at this time.  Readmission Risk Interventions Readmission Risk Prevention Plan 02/10/2019 11/07/2017  Post Dischage Appt - Patient refused  Medication Screening - Complete  Transportation Screening Complete Complete  PCP follow-up - Patient refused  PCP or Specialist Appt within 3-5 Days Not Complete -  Not Complete comments DC date unknown- pt established with PCP -  [place] or Home Care Consult Complete -  Social Work Consult for [place] Planning/Counseling Not Complete -  SW consult not completed comments awaiting call back from daughter and APS -  Palliative Care Screening Not Complete -  Palliative Care Screening Not Complete Comments pending need -  Medication Review ([place]) Referral to Pharmacy -  Some recent data might be hidden

## 2019-06-06 NOTE — Progress Notes (Signed)
PROGRESS NOTE    Gina [name]  RWE:[id number] DOB: [birth year] DOA: 05/22/2019 PCP: [name], MD   Brief Narrative: 59 year old female with DM, HTN, schizophrenia, bilateral BKA sent from [facility] due to positive blood culture. She was recently admitted  on November 2020 for perforated prepyloric ulcer with septic shock with acute peritonitis status post exlap with [name] patch on 11/23, and was discharged to [facility] on Abx and antifungal for total 6 weeks. Patient was discharged with Foley catheter, PICC line, bilateral lower quadrant drains along with enterocutaneous fistula with colostomy bag . She was discharged from [facility] to [facility] whiere she has been living for last 3 months. Patient was sent to ER for vague sleepiness, malaise ,anorexia low-grade fever and leukocytosis at SNF, and in the ER exam labs were  unremarkable and patient was sent back to SNF but overnight blood culture showed MRSA and she was readmitted to the hospital. Patient is being followed by infectious disease, general surgery.She did well with speech, diet was restarted.Foley catheter was exchanged. Repeat blood culture negative from 3/2. ID has advised to continue vancomycin through 3/25 and ordered PICC line.Surgery has been following patient and on calorie count,  Patient and family planning to return home with home health services upon discharge and has declined skilled nursing facility placement.  She has been started back on TPN.  She was febrile last weekend.  Afebrile since last 3 days.  General surgery following. General surgery has cleared for discharge.  Patient and daughter want home health.  She needs TPN at home.  She is medically stable for discharge as soon as home health and other requirements arranged.  Assessment & Plan:   Principal Problem:   MRSA bacteremia Active Problems:   HTN (hypertension)   Diabetes ([place])   Severe protein-calorie malnutrition ([place])   Anemia of  chronic disease   Type 2 diabetes mellitus with hyperosmolar nonketotic hyperglycemia (HCC)   Pressure injury of right thigh, unstageable (HCC)   Below-knee amputee ([place])   Atherosclerosis of native arteries of extremities with gangrene, right leg (HCC)   Intra-abdominal fluid collection   Palliative care by specialist   Goals of care, counseling/discussion   DNR (do not resuscitate)   Physical deconditioning   Dysphagia   Malnutrition of moderate degree  MSSA bacteremia: Indwelling PICC line used for TPN was felt to be the likely source, PICC line was removed.  Repeat blood cultures have been negative.  New PICC line placed on 3/18.  Completed vancomycin course on 3/25.  ID was following.  New cultures have been sent, so far negative.  Chest x-ray did not show any pneumonia.Afebrile since last several days  Intra-abdominal fluid collection: Patient with history of exploratory laparotomy/partial small bowel resection for intra-abdominal abscess in November.  She has Eakins pouch and LLQ drain. She was admitted in November 2020 for perforated prepyloric ulcer with septic shock, acute peritonitis.  Status post exploratory laparotomy with [name] patch. She had 2 separate enterocutaneous fistula.  She might need injection studies to further characterize fistulas in the future as well as EGD.  Moderate protein calorie malnutrition: Dietitian following.  Currently on TPN.Also on oral diet.  Currently having loose stools.  On Imodium  Hypertension: Currently blood pressure well controlled.  On amlodipine  Diabetes type 2: Hemoglobin A1c of 5.2 as per  3/12.  Currently on sliding scale insulin.  Normocytic anemia: Has been transfused with several units of PRBC during the hospitalization.  Currently hemoglobin stable  GERD: Continue PPI  Mood disorder: Continue Celexa  Goals of care: Patient is DNR.  Palliative care was following.  Recommended to follow-up as an outpatient.  Pressure Injury  02/03/19 Coccyx Right;Left;Medial Unstageable - Full thickness tissue loss in which the base of the ulcer is covered by slough (yellow, tan, gray, green or brown) and/or eschar (tan, brown or black) in the wound bed. pt has 3 small injuri (Active)  02/03/19 0230  Location: Coccyx  Location Orientation: Right;Left;Medial  Staging: Unstageable - Full thickness tissue loss in which the base of the ulcer is covered by slough (yellow, tan, gray, green or brown) and/or eschar (tan, brown or black) in the wound bed.  Wound Description (Comments): pt has 3 small injuries all in the same area, one is medial and two are on either side of it  Present on Admission: Yes     Pressure Injury 05/22/19 Coccyx Bilateral Stage 2 -  Partial thickness loss of dermis presenting as a shallow open injury with a red, pink wound bed without slough. (Active)  05/22/19 2104  Location: Coccyx  Location Orientation: Bilateral  Staging: Stage 2 -  Partial thickness loss of dermis presenting as a shallow open injury with a red, pink wound bed without slough.  Wound Description (Comments):   Present on Admission: Yes     Pressure Injury 05/22/19 Knee Right;Distal Unstageable - Full thickness tissue loss in which the base of the injury is covered by slough (yellow, tan, gray, green or brown) and/or eschar (tan, brown or black) in the wound bed. Stump (Active)  05/22/19 2106  Location: Knee  Location Orientation: Right;Distal  Staging: Unstageable - Full thickness tissue loss in which the base of the injury is covered by slough (yellow, tan, gray, green or brown) and/or eschar (tan, brown or black) in the wound bed.  Wound Description (Comments): Stump  Present on Admission: Yes     Pressure Injury 05/22/19 Knee Left;Distal Unstageable - Full thickness tissue loss in which the base of the injury is covered by slough (yellow, tan, gray, green or brown) and/or eschar (tan, brown or black) in the wound bed. Stump (Active)    05/22/19 2108  Location: Knee  Location Orientation: Left;Distal  Staging: Unstageable - Full thickness tissue loss in which the base of the injury is covered by slough (yellow, tan, gray, green or brown) and/or eschar (tan, brown or black) in the wound bed.  Wound Description (Comments): Stump  Present on Admission: Yes     Pressure Injury 05/22/19 Thigh Left;Mid Stage 2 -  Partial thickness loss of dermis presenting as a shallow open injury with a red, pink wound bed without slough. (Active)  05/22/19 2109  Location: Thigh  Location Orientation: Left;Mid  Staging: Stage 2 -  Partial thickness loss of dermis presenting as a shallow open injury with a red, pink wound bed without slough.  Wound Description (Comments):   Present on Admission: Yes         Nutrition Problem: Moderate Malnutrition Etiology: chronic illness(EC fistula)      DVT prophylaxis:Lovenox Code Status: DNR Family Communication: Called daughter and discussed on the phone on 06/03/2019 Disposition Plan: Patient is from skilled nursing facility.  PT/OT recommending skilled nursing facility.   Patient and her daughter deny skilled nursing facility and want to go home with home health.  She is medically stable for discharge from medical perspective.  Waiting for home health arrangement.  Consultants: General surgery, ID  Procedures:As above  Antimicrobials:  Anti-infectives (From admission, onward)   Start     Dose/Rate Route Frequency Ordered Stop   06/01/19 1200  vancomycin (VANCOCIN) IVPB 1000 mg/200 mL premix  Status:  Discontinued     1,000 mg 200 mL/hr over 60 Minutes Intravenous Every 48 hours 05/31/19 2352 06/01/19 0813   06/01/19 1200  vancomycin (VANCOREADY) IVPB 1250 mg/250 mL     1,250 mg 166.7 mL/hr over 90 Minutes Intravenous Every 48 hours 06/01/19 0813 06/05/19 1401   05/31/19 1438  vancomycin variable dose per unstable renal function (pharmacist dosing)  Status:  Discontinued      Does not  apply See admin instructions 05/31/19 1438 05/31/19 2351   05/25/19 2200  vancomycin (VANCOCIN) IVPB 1000 mg/200 mL premix  Status:  Discontinued     1,000 mg 200 mL/hr over 60 Minutes Intravenous Every 24 hours 05/25/19 0958 05/31/19 1438   05/24/19 2200  vancomycin (VANCOREADY) IVPB 750 mg/150 mL  Status:  Discontinued     750 mg 150 mL/hr over 60 Minutes Intravenous Every 24 hours 05/24/19 1156 05/25/19 0958   05/23/19 2200  vancomycin (VANCOCIN) IVPB 1000 mg/200 mL premix     1,000 mg 200 mL/hr over 60 Minutes Intravenous  Once 05/23/19 1345 05/24/19 0008   05/23/19 1113  vancomycin variable dose per unstable renal function (pharmacist dosing)  Status:  Discontinued      Does not apply See admin instructions 05/23/19 1114 05/24/19 1156   05/22/19 1500  vancomycin (VANCOREADY) IVPB 1250 mg/250 mL  Status:  Discontinued     1,250 mg 166.7 mL/hr over 90 Minutes Intravenous Every 24 hours 05/22/19 1429 05/23/19 1114      Subjective: Patient seen and examined at the bedside this morning.  Comfortable.  No active issues.  Denies any abdominal pain  Objective: Vitals:   06/05/19 1500 06/05/19 2137 06/05/19 2137 06/06/19 0356  BP: 139/69 (!) 126/59 (!) 126/59 (!) 107/54  Pulse: 78 75 75 77  Resp: 18 16 16 16   Temp: 97.8 F (36.6 C) 99.6 F (37.6 C) 99.6 F (37.6 C) 98.6 F (37 C)  TempSrc: Oral Oral Oral Oral  SpO2: 97% 92% 92% 93%  Weight:   61 kg   Height:        Intake/Output Summary (Last 24 hours) at 06/06/2019 0835 Last data filed at 06/06/2019 0746 Gross per 24 hour  Intake 1557.99 ml  Output 3250 ml  Net -1692.01 ml   Filed Weights   05/30/19 2058 06/02/19 2124 06/05/19 2137  Weight: 61 kg 55 kg 61 kg    Examination:  General exam: Chronically ill looking, frail, tachycardic, noticed Respiratory system: Bilateral equal air entry, normal vesicular breath sounds, no wheezes or crackles  Cardiovascular system: S1 & S2 heard, RRR. No JVD, murmurs, rubs, gallops or  clicks. Gastrointestinal system: Abdomen is nondistended, soft and nontender. eakin's pouch with brown liquid stool, drain on the left lower quadrant  Central nervous system: Alert and oriented. No focal neurological deficits. Extremities: bilateral BkA. Skin:Minor skin breakdowns    Data Reviewed: I have personally reviewed following labs and imaging studies  CBC: Recent Labs  Lab 05/30/19 0903 05/31/19 1020 06/02/19 0415  WBC 12.1* 9.6 9.4  NEUTROABS  --  6.1 7.8*  HGB 10.2* 9.9* 8.9*  HCT 31.7* 30.6* 28.3*  MCV 91.1 91.3 94.3  PLT 393 367 102   Basic Metabolic Panel: Recent Labs  Lab 05/31/19 1020 06/01/19 0317 06/02/19 0415 06/03/19 0412 06/05/19 0323  NA 131* 132* 130* 131*  131*  K 4.8 4.6 5.0 4.9 4.9  CL 106 108 105 105 103  CO2 20* 19* 19* 21* 23  GLUCOSE 178* 151* 185* 90 186*  BUN 28* 32* 45* 57* 62*  CREATININE 0.83 0.63 0.82 0.88 0.87  CALCIUM 8.2* 8.2* 8.5* 8.5* 8.6*  MG 1.9 1.8 2.1 2.1 2.0  PHOS 3.3 3.8 4.4 4.5 4.5   GFR: Estimated Creatinine Clearance: 60.6 mL/min (by C-G formula based on SCr of 0.87 mg/dL). Liver Function Tests: Recent Labs  Lab 06/02/19 0415 06/05/19 0323  AST 10* 17  ALT 9 16  ALKPHOS 104 150*  BILITOT 0.6 0.5  PROT 6.1* 6.5  ALBUMIN 1.3* 1.4*   No results for input(s): LIPASE, AMYLASE in the last 168 hours. No results for input(s): AMMONIA in the last 168 hours. Coagulation Profile: No results for input(s): INR, PROTIME in the last 168 hours. Cardiac Enzymes: No results for input(s): CKTOTAL, CKMB, CKMBINDEX, TROPONINI in the last 168 hours. BNP (last 3 results) No results for input(s): PROBNP in the last 8760 hours. HbA1C: No results for input(s): HGBA1C in the last 72 hours. CBG: Recent Labs  Lab 06/05/19 1109 06/05/19 1618 06/05/19 2139 06/06/19 0134 06/06/19 0603  GLUCAP 103* 137* 127* 148* 111*   Lipid Profile: No results for input(s): CHOL, HDL, LDLCALC, TRIG, CHOLHDL, LDLDIRECT in the last 72  hours. Thyroid Function Tests: No results for input(s): TSH, T4TOTAL, FREET4, T3FREE, THYROIDAB in the last 72 hours. Anemia Panel: No results for input(s): VITAMINB12, FOLATE, FERRITIN, TIBC, IRON, RETICCTPCT in the last 72 hours. Sepsis Labs: No results for input(s): PROCALCITON, LATICACIDVEN in the last 168 hours.  Recent Results (from the past 240 hour(s))  Culture, blood (routine x 2)     Status: None (Preliminary result)   Collection Time: 06/01/19  9:13 AM   Specimen: BLOOD RIGHT HAND  Result Value Ref Range Status   Specimen Description BLOOD RIGHT HAND  Final   Special Requests   Final    BOTTLES DRAWN AEROBIC ONLY Blood Culture adequate volume   Culture   Final    NO GROWTH 4 DAYS Performed at [facility], [address] [place], [place] [address]    Report Status PENDING  Incomplete  Culture, blood (routine x 2)     Status: None (Preliminary result)   Collection Time: 06/01/19  9:13 AM   Specimen: BLOOD  Result Value Ref Range Status   Specimen Description BLOOD LEFT ANTECUBITAL  Final   Special Requests   Final    BOTTLES DRAWN AEROBIC ONLY Blood Culture results may not be optimal due to an inadequate volume of blood received in culture bottles   Culture   Final    NO GROWTH 4 DAYS Performed at [facility], [place] [address], [place],  [address]    Report Status PENDING  Incomplete         Radiology Studies: No results found.      Scheduled Meds: . amLODipine  5 mg Oral Daily  . chlorhexidine  15 mL Mouth Rinse BID  . Chlorhexidine Gluconate Cloth  6 each Topical Daily  . enoxaparin (LOVENOX) injection  40 mg Subcutaneous Q24H  . escitalopram  10 mg Oral Daily  . gabapentin  300 mg Oral BID  . insulin aspart  0-15 Units Subcutaneous Q6H  . loperamide  2 mg Oral Q8H  . mouth rinse  15 mL Mouth Rinse q12n4p  . melatonin  3 mg Oral QHS  .  metoprolol tartrate  50 mg Oral BID  . multivitamin with minerals  1 tablet Oral Daily  .  pantoprazole  40 mg Oral Daily  . simethicone  80 mg Oral QID   Continuous Infusions: . TPN ADULT (ION) 75 mL/hr at 06/05/19 1722  . TPN CYCLIC-ADULT (ION)       LOS: 15 days    Time spent: 25 mins, More than 50% of that time was spent in counseling and/or coordination of care.      Shelly Coss, MD [facility] P3/26/2021, 8:35 AM

## 2019-06-06 NOTE — TOC Progression Note (Addendum)
Transition of Care [facility]) - Progression Note    Patient Details  Name: Gina [name] MRN: [id number] Date of Birth: 09-12-1960  Transition of [facility]) CM/SW Contact  Bartholomew Crews, RN Phone Number: [phone] 06/06/2019, 3:53 PM  Clinical Narrative:    Discussed with patient's daughter, [name], about setting up patient with [facility] for Eakin's pouches and barrier rings. NCM called [facility] to create account and place initial order. Account information placed on AVS. TOC team following for transition needs.   [facility] to contract with [facility] for infusion RN to provide PICC care and assistance with TPN.   Update:  Spoke with liaison at [facility]. Arrangements have been made for [facility] to follow up with patient next week after discharge home.   NCM also spoke with liaison at [facility] about DME orders for hospital bed/hoyer lift and reclining wheelchair. Will follow up on Monday.  TOC team following for transition needs.    Expected Discharge Plan: Skilled Nursing Facility(Definite plan not decided yet by patient/adult children. It is between returning to SNF or going home with services (possibly PCS/HH)) Barriers to Discharge: Continued Medical Work up  Expected Discharge Plan and Services Expected Discharge Plan: Skilled Nursing Facility(Definite plan not decided yet by patient/adult children. It is between returning to SNF or going home with services (possibly PCS/HH))                                               Social Determinants of Health (SDOH) Interventions    Readmission Risk Interventions Readmission Risk Prevention Plan 02/10/2019 11/07/2017  Post Dischage Appt - Patient refused  Medication Screening - Complete  Transportation Screening Complete Complete  PCP follow-up - Patient refused  PCP or Specialist Appt within 3-5 Days Not Complete -  Not Complete comments DC date unknown- pt established with PCP -  [place] or [place] Complete -  Social Work Consult for [place] Planning/Counseling Not Complete -  SW consult not completed comments awaiting call back from daughter and APS -  Palliative Care Screening Not Complete -  Palliative Care Screening Not Complete Comments pending need -  Medication Review ([place]) Referral to Pharmacy -  Some recent data might be hidden

## 2019-06-06 NOTE — Progress Notes (Signed)
PT Cancellation Note  Patient Details Name: Gina [name] MRN: [id number] DOB: 01-28-1961   Cancelled Treatment:    Reason Eval/Treat Not Completed: Fatigue/lethargy limiting ability to participate - Pt sleeping upon arrival to room, states "I don't feel like it" when asked to mobilize. PT encouraged pt to roll L and R for pressure relief if unwilling to get up, and she is agreeable.   [place] Pager [phone]  Office [phone]    Roxine Caddy D Elonda Husky 06/06/2019, 2:17 PM

## 2019-06-06 NOTE — Plan of Care (Signed)
  Problem: Nutrition: Goal: Adequate nutrition will be maintained Outcome: Progressing   Problem: Pain Managment: Goal: General experience of comfort will improve Outcome: Progressing   

## 2019-06-07 LAB — GLUCOSE, CAPILLARY
Glucose-Capillary: 103 mg/dL — ABNORMAL HIGH (ref 70–99)
Glucose-Capillary: 153 mg/dL — ABNORMAL HIGH (ref 70–99)
Glucose-Capillary: 156 mg/dL — ABNORMAL HIGH (ref 70–99)
Glucose-Capillary: 86 mg/dL (ref 70–99)

## 2019-06-07 LAB — BASIC METABOLIC PANEL
Anion gap: 6 (ref 5–15)
BUN: 60 mg/dL — ABNORMAL HIGH (ref 6–20)
CO2: 23 mmol/L (ref 22–32)
Calcium: 9 mg/dL (ref 8.9–10.3)
Chloride: 104 mmol/L (ref 98–111)
Creatinine, Ser: 0.78 mg/dL (ref 0.44–1.00)
GFR calc Af Amer: >60 mL/min (ref >60)
GFR calc non Af Amer: >60 mL/min (ref >60)
Glucose, Bld: 142 mg/dL — ABNORMAL HIGH (ref 70–99)
Potassium: 4.6 mmol/L (ref 3.5–5.1)
Sodium: 133 mmol/L — ABNORMAL LOW (ref 135–145)

## 2019-06-07 LAB — MAGNESIUM: Magnesium: 1.8 mg/dL (ref 1.7–2.4)

## 2019-06-07 LAB — PHOSPHORUS: Phosphorus: 3.5 mg/dL (ref 2.5–4.6)

## 2019-06-07 MED ORDER — TRAVASOL 10 % IV SOLN
INTRAVENOUS | Status: AC
  Filled 2019-06-07: qty 1080

## 2019-06-07 NOTE — Progress Notes (Signed)
PHARMACY - TOTAL PARENTERAL NUTRITION CONSULT NOTE   Indication: Fistula  Patient Measurements: Height: 5\' 2"  (157.5 cm) Weight: 134 lb 7.7 oz (61 kg) IBW/kg (Calculated) : 50.1 TPN AdjBW (KG): 54.6 Body mass index is 24.6 kg/m.  Assessment:  18 YOF with hx of partial small bowel resection/EC fistula on chronic TPN since beginning of year.  Significant history of DM with bilateral BKA.  Presenting from SNF with MRSA bacteremia, suspected source from PICC.  Has been on oral diet this admission with line holiday however decreased appetite and may not be absorbing much per RD, unable to meet requirements.  Moderate malnutrition of chronic disease per RD assessment.     Glucose / Insulin: History of DM and requirement of insulin in past TPNs. Previously this admission on D10 to maintain euglycemia before TPN start. Current CBGs 95-156, required 9 units of Novolog insulin in last 24 hours. Insulin requirement is not high enough to justify insulin in TPN.  Electrolytes: Na low at 133, K 4.6, CO2 23, Mag 1.8, Phos 3.5, and CoCa 10.7.  Renal: SCr 0.78- stable. BUN up 60.  LFTs / TGs: LFTs/Tbili within normal limits. TG 181>101.  Prealbumin / albumin: Prealbumin 6/ Albumin 1.4 Intake / Output; MIVF: UOP 1.6 mL/kg/hr. Colostomy output 900 mL - on Imodium 2mg  every 8 hours. JP drain 0 mL.  GI Imaging: none since start of TPN Surgeries / Procedures: none since start of TPN  Central access: PICC replaced 05/29/19 TPN start date: 05/29/19  Nutritional Goals (per RD recommendation on 3/18): kCal: 1800-2000, Protein: 83-110g, Fluid: >= 1.9L Goal TPN rate is 75 mL/hr (provides 108 g of protein and 1819 kcals per day)  Current Nutrition:   Soft diet - Dysphagia 2 --intake 0-25% of meals  Cycling TPN for discharge home    Plan:  Change to cyclic TPN over 14 hours - rate of 69 mL/hr x 1 hour, the 138 ml/hr x 12 hrs, then 69 ml/hr x 1 hour then off. Electrolytes in TPN: 60 mEq/L of Na, 18 mEq/L of K  (32 mEq/day), 2.5 mEq/L of Ca, 6 mEq/L of Mg, and decrease to 7.5 mmol/L of Phos. Cl: Acetate 1:2. Patient receiving oral MVI which contains trace - will not add to TPN.  Continue coverage with Moderate q6h SSI and adjust as needed. TPN labs Mon/Thurs.   Monitor po intake (starting to increase) and plan for TPN long-term/discharge.    Alanda Slim, PharmD, [facility] Clinical Pharmacist Please see AMION for all Pharmacists' Contact Phone Numbers 06/07/2019, 7:53 AM

## 2019-06-07 NOTE — Progress Notes (Signed)
PROGRESS NOTE    Gina [name]  ZGY:[id number] DOB: 1961-02-25 DOA: 05/22/2019 PCP: [name], MD   Brief Narrative: 59 year old female with DM, HTN, schizophrenia, bilateral BKA sent from [facility] due to positive blood culture. She was recently admitted  on November 2020 for perforated prepyloric ulcer with septic shock with acute peritonitis status post exlap with [name] patch on 11/23, and was [facility] on Abx and antifungal for total 6 weeks. Patient was discharged with Foley catheter, PICC line, bilateral lower quadrant drains along with enterocutaneous fistula with colostomy bag . She was discharged from [place] to [facility] whiere she has been living for last 3 months. Patient was sent to ER for vague sleepiness, malaise ,anorexia low-grade fever and leukocytosis at SNF, and in the ER exam labs were  unremarkable and patient was sent back to SNF but overnight blood culture showed MRSA and she was readmitted to the hospital. Patient is being followed by infectious disease, general surgery.She did well with speech, diet was restarted.Foley catheter was exchanged. Repeat blood culture negative from 3/2. ID has advised to continue vancomycin through 3/25 and ordered PICC line.Surgery has been following patient and on calorie count,  Patient and family planning to return home with home health services upon discharge and has declined skilled nursing facility placement.  She has been started back on TPN.  She was febrile last weekend.  Afebrile since last 3 days.  General surgery following. General surgery has cleared for discharge.  Patient and daughter want home health.  She needs TPN at home.  She is medically stable for discharge as soon as home health and other requirements arranged.  Assessment & Plan:   Principal Problem:   MRSA bacteremia Active Problems:   HTN (hypertension)   Diabetes ([place])   Severe protein-calorie malnutrition ([place])   Anemia of  chronic disease   Type 2 diabetes mellitus with hyperosmolar nonketotic hyperglycemia (HCC)   Pressure injury of right thigh, unstageable (HCC)   Below-knee amputee ( [place])   Atherosclerosis of native arteries of extremities with gangrene, right leg (HCC)   Intra-abdominal fluid collection   Palliative care by specialist   Goals of care, counseling/discussion   DNR (do not resuscitate)   Physical deconditioning   Dysphagia   Malnutrition of moderate degree  MSSA bacteremia: Indwelling PICC line used for TPN was felt to be the likely source, PICC line was removed.  Repeat blood cultures have been negative.  New PICC line placed on 3/18.  Completed vancomycin course on 3/25.  ID was following.  New cultures have been sent, so far negative.  Chest x-ray did not show any pneumonia.Afebrile since last several days  Intra-abdominal fluid collection: Patient with history of exploratory laparotomy/partial small bowel resection for intra-abdominal abscess in November.  She has Eakins pouch and LLQ drain. She was admitted in November 2020 for perforated prepyloric ulcer with septic shock, acute peritonitis.  Status post exploratory laparotomy with [name] patch. She had 2 separate enterocutaneous fistula.  She might need injection studies to further characterize fistulas in the future as well as EGD.  Moderate protein calorie malnutrition: Dietitian following.  Currently on TPN.Also on oral diet.  Currently having loose stools.  On Imodium  Hypertension: Currently blood pressure well controlled.  On amlodipine  Diabetes type 2: Hemoglobin A1c of 5.2 as per  3/12.  Currently on sliding scale insulin.  Normocytic anemia: Has been transfused with several units of PRBC during the hospitalization.  Currently hemoglobin stable  GERD: Continue PPI  Mood disorder: Continue Celexa  Goals of care: Patient is DNR.  Palliative care was following.  Recommended to follow-up as an outpatient.  Pressure Injury  02/03/19 Coccyx Right;Left;Medial Unstageable - Full thickness tissue loss in which the base of the ulcer is covered by slough (yellow, tan, gray, green or brown) and/or eschar (tan, brown or black) in the wound bed. pt has 3 small injuri (Active)  02/03/19 0230  Location: Coccyx  Location Orientation: Right;Left;Medial  Staging: Unstageable - Full thickness tissue loss in which the base of the ulcer is covered by slough (yellow, tan, gray, green or brown) and/or eschar (tan, brown or black) in the wound bed.  Wound Description (Comments): pt has 3 small injuries all in the same area, one is medial and two are on either side of it  Present on Admission: Yes     Pressure Injury 05/22/19 Coccyx Bilateral Stage 2 -  Partial thickness loss of dermis presenting as a shallow open injury with a red, pink wound bed without slough. (Active)  05/22/19 2104  Location: Coccyx  Location Orientation: Bilateral  Staging: Stage 2 -  Partial thickness loss of dermis presenting as a shallow open injury with a red, pink wound bed without slough.  Wound Description (Comments):   Present on Admission: Yes     Pressure Injury 05/22/19 Knee Right;Distal Unstageable - Full thickness tissue loss in which the base of the injury is covered by slough (yellow, tan, gray, green or brown) and/or eschar (tan, brown or black) in the wound bed. Stump (Active)  05/22/19 2106  Location: Knee  Location Orientation: Right;Distal  Staging: Unstageable - Full thickness tissue loss in which the base of the injury is covered by slough (yellow, tan, gray, green or brown) and/or eschar (tan, brown or black) in the wound bed.  Wound Description (Comments): Stump  Present on Admission: Yes     Pressure Injury 05/22/19 Knee Left;Distal Unstageable - Full thickness tissue loss in which the base of the injury is covered by slough (yellow, tan, gray, green or brown) and/or eschar (tan, brown or black) in the wound bed. Stump (Active)    05/22/19 2108  Location: Knee  Location Orientation: Left;Distal  Staging: Unstageable - Full thickness tissue loss in which the base of the injury is covered by slough (yellow, tan, gray, green or brown) and/or eschar (tan, brown or black) in the wound bed.  Wound Description (Comments): Stump  Present on Admission: Yes     Pressure Injury 05/22/19 Thigh Left;Mid Stage 2 -  Partial thickness loss of dermis presenting as a shallow open injury with a red, pink wound bed without slough. (Active)  05/22/19 2109  Location: Thigh  Location Orientation: Left;Mid  Staging: Stage 2 -  Partial thickness loss of dermis presenting as a shallow open injury with a red, pink wound bed without slough.  Wound Description (Comments):   Present on Admission: Yes         Nutrition Problem: Moderate Malnutrition Etiology: chronic illness(EC fistula)      DVT prophylaxis:Lovenox Code Status: DNR Family Communication: Called daughter and discussed on the phone on 06/03/2019 Disposition Plan: Patient is from skilled nursing facility.  PT/OT recommending skilled nursing facility.   Patient and her daughter deny skilled nursing facility and want to go home with home health.  She is medically stable for discharge from medical perspective.  Waiting for home health arrangement.  Consultants: General surgery, ID  Procedures:As above  Antimicrobials:  Anti-infectives (From admission, onward)   Start     Dose/Rate Route Frequency Ordered Stop   06/01/19 1200  vancomycin (VANCOCIN) IVPB 1000 mg/200 mL premix  Status:  Discontinued     1,000 mg 200 mL/hr over 60 Minutes Intravenous Every 48 hours 05/31/19 2352 06/01/19 0813   06/01/19 1200  vancomycin (VANCOREADY) IVPB 1250 mg/250 mL     1,250 mg 166.7 mL/hr over 90 Minutes Intravenous Every 48 hours 06/01/19 0813 06/05/19 1401   05/31/19 1438  vancomycin variable dose per unstable renal function (pharmacist dosing)  Status:  Discontinued      Does not  apply See admin instructions 05/31/19 1438 05/31/19 2351   05/25/19 2200  vancomycin (VANCOCIN) IVPB 1000 mg/200 mL premix  Status:  Discontinued     1,000 mg 200 mL/hr over 60 Minutes Intravenous Every 24 hours 05/25/19 0958 05/31/19 1438   05/24/19 2200  vancomycin (VANCOREADY) IVPB 750 mg/150 mL  Status:  Discontinued     750 mg 150 mL/hr over 60 Minutes Intravenous Every 24 hours 05/24/19 1156 05/25/19 0958   05/23/19 2200  vancomycin (VANCOCIN) IVPB 1000 mg/200 mL premix     1,000 mg 200 mL/hr over 60 Minutes Intravenous  Once 05/23/19 1345 05/24/19 0008   05/23/19 1113  vancomycin variable dose per unstable renal function (pharmacist dosing)  Status:  Discontinued      Does not apply See admin instructions 05/23/19 1114 05/24/19 1156   05/22/19 1500  vancomycin (VANCOREADY) IVPB 1250 mg/250 mL  Status:  Discontinued     1,250 mg 166.7 mL/hr over 90 Minutes Intravenous Every 24 hours 05/22/19 1429 05/23/19 1114      Subjective: Patient seen and examined the bedside this morning.  No nausea still.  Comfortable.  No active issues.  Denies any abdominal pain.  Objective: Vitals:   06/06/19 1024 06/06/19 1658 06/06/19 2045 06/07/19 0429  BP: 140/79 (!) 127/59 (!) 154/63 (!) 125/56  Pulse: 73 78 76 81  Resp: 14 16    Temp: 97.9 F (36.6 C) 98.4 F (36.9 C) 98.3 F (36.8 C) 98.8 F (37.1 C)  TempSrc: Oral Oral Oral Oral  SpO2: 99% 92% 93% 95%  Weight:      Height:        Intake/Output Summary (Last 24 hours) at 06/07/2019 0815 Last data filed at 06/07/2019 0640 Gross per 24 hour  Intake 2689.42 ml  Output 1900 ml  Net 789.42 ml   Filed Weights   05/30/19 2058 06/02/19 2124 06/05/19 2137  Weight: 61 kg 55 kg 61 kg    Examination:  General exam: Chronically ill looking, frail, cachectic, intense foul smell while entering the room Respiratory system: no wheezes or crackles  Cardiovascular system: S1 & S2 heard, RRR. No JVD, murmurs, rubs, gallops or  clicks. Gastrointestinal system: Abdomen is nondistended, soft and nontender.eakin's pouch with brown liquid stool, drain on the left lower quadrant   Central nervous system: Alert and oriented. Extremities: Bilateral BKA Skin: Minor skin breakdowns  Data Reviewed: I have personally reviewed following labs and imaging studies  CBC: Recent Labs  Lab 05/31/19 1020 06/02/19 0415  WBC 9.6 9.4  NEUTROABS 6.1 7.8*  HGB 9.9* 8.9*  HCT 30.6* 28.3*  MCV 91.3 94.3  PLT 367 226   Basic Metabolic Panel: Recent Labs  Lab 06/01/19 0317 06/02/19 0415 06/03/19 0412 06/05/19 0323 06/07/19 0420  NA 132* 130* 131* 131* 133*  K 4.6 5.0 4.9 4.9 4.6  CL 108 105 105 103 104  CO2 19* 19* 21* 23 23  GLUCOSE 151* 185* 90 186* 142*  BUN 32* 45* 57* 62* 60*  CREATININE 0.63 0.82 0.88 0.87 0.78  CALCIUM 8.2* 8.5* 8.5* 8.6* 9.0  MG 1.8 2.1 2.1 2.0 1.8  PHOS 3.8 4.4 4.5 4.5 3.5   GFR: Estimated Creatinine Clearance: 65.9 mL/min (by C-G formula based on SCr of 0.78 mg/dL). Liver Function Tests: Recent Labs  Lab 06/02/19 0415 06/05/19 0323  AST 10* 17  ALT 9 16  ALKPHOS 104 150*  BILITOT 0.6 0.5  PROT 6.1* 6.5  ALBUMIN 1.3* 1.4*   No results for input(s): LIPASE, AMYLASE in the last 168 hours. No results for input(s): AMMONIA in the last 168 hours. Coagulation Profile: No results for input(s): INR, PROTIME in the last 168 hours. Cardiac Enzymes: No results for input(s): CKTOTAL, CKMB, CKMBINDEX, TROPONINI in the last 168 hours. BNP (last 3 results) No results for input(s): PROBNP in the last 8760 hours. HbA1C: No results for input(s): HGBA1C in the last 72 hours. CBG: Recent Labs  Lab 06/06/19 0603 06/06/19 1149 06/06/19 1656 06/06/19 2311 06/07/19 0534  GLUCAP 111* 151* 95 182* 156*   Lipid Profile: No results for input(s): CHOL, HDL, LDLCALC, TRIG, CHOLHDL, LDLDIRECT in the last 72 hours. Thyroid Function Tests: No results for input(s): TSH, T4TOTAL, FREET4, T3FREE,  THYROIDAB in the last 72 hours. Anemia Panel: No results for input(s): VITAMINB12, FOLATE, FERRITIN, TIBC, IRON, RETICCTPCT in the last 72 hours. Sepsis Labs: No results for input(s): PROCALCITON, LATICACIDVEN in the last 168 hours.  Recent Results (from the past 240 hour(s))  Culture, blood (routine x 2)     Status: None   Collection Time: 06/01/19  9:13 AM   Specimen: BLOOD RIGHT HAND  Result Value Ref Range Status   Specimen Description BLOOD RIGHT HAND  Final   Special Requests   Final    BOTTLES DRAWN AEROBIC ONLY Blood Culture adequate volume   Culture   Final    NO GROWTH 5 DAYS Performed at [facility], [address] [place], [place] [address]    Report Status 06/06/2019 FINAL  Final  Culture, blood (routine x 2)     Status: None   Collection Time: 06/01/19  9:13 AM   Specimen: BLOOD  Result Value Ref Range Status   Specimen Description BLOOD LEFT ANTECUBITAL  Final   Special Requests   Final    BOTTLES DRAWN AEROBIC ONLY Blood Culture results may not be optimal due to an inadequate volume of blood received in culture bottles   Culture   Final    NO GROWTH 5 DAYS Performed at [facility], [place] [address], [place], [place] [address]    Report Status 06/06/2019 FINAL  Final         Radiology Studies: No results found.      Scheduled Meds: . amLODipine  5 mg Oral Daily  . chlorhexidine  15 mL Mouth Rinse BID  . Chlorhexidine Gluconate Cloth  6 each Topical Daily  . enoxaparin (LOVENOX) injection  40 mg Subcutaneous Q24H  . escitalopram  10 mg Oral Daily  . gabapentin  300 mg Oral BID  . insulin aspart  0-15 Units Subcutaneous Q6H  . loperamide  2 mg Oral Q8H  . mouth rinse  15 mL Mouth Rinse q12n4p  . melatonin  3 mg Oral QHS  . metoprolol tartrate  50 mg Oral BID  . multivitamin with minerals  1 tablet Oral Daily  .  pantoprazole  40 mg Oral Daily  . simethicone  80 mg Oral QID   Continuous Infusions: . TPN CYCLIC-ADULT (ION) 829 mL/hr  at 06/06/19 1926  . TPN CYCLIC-ADULT (ION)       LOS: 16 days    Time spent: 25 mins, More than 50% of that time was spent in counseling and/or coordination of care.      Shelly Coss, MD [facility] P3/27/2021, 8:15 AM

## 2019-06-08 LAB — CBC WITH DIFFERENTIAL/PLATELET
Abs Immature Granulocytes: 0.03 10*3/uL (ref 0.00–0.07)
Basophils Absolute: 0 10*3/uL (ref 0.0–0.1)
Basophils Relative: 0 %
Eosinophils Absolute: 0.2 10*3/uL (ref 0.0–0.5)
Eosinophils Relative: 2 %
HCT: 25.1 % — ABNORMAL LOW (ref 36.0–46.0)
Hemoglobin: 7.8 g/dL — ABNORMAL LOW (ref 12.0–15.0)
Immature Granulocytes: 0 %
Lymphocytes Relative: 22 %
Lymphs Abs: 1.7 10*3/uL (ref 0.7–4.0)
MCH: 29.1 pg (ref 26.0–34.0)
MCHC: 31.1 g/dL (ref 30.0–36.0)
MCV: 93.7 fL (ref 80.0–100.0)
Monocytes Absolute: 0.8 10*3/uL (ref 0.1–1.0)
Monocytes Relative: 10 %
Neutro Abs: 5.1 10*3/uL (ref 1.7–7.7)
Neutrophils Relative %: 66 %
Platelets: 309 10*3/uL (ref 150–400)
RBC: 2.68 MIL/uL — ABNORMAL LOW (ref 3.87–5.11)
RDW: 15.3 % (ref 11.5–15.5)
WBC: 7.8 10*3/uL (ref 4.0–10.5)
nRBC: 0 % (ref 0.0–0.2)

## 2019-06-08 LAB — GLUCOSE, CAPILLARY
Glucose-Capillary: 163 mg/dL — ABNORMAL HIGH (ref 70–99)
Glucose-Capillary: 126 mg/dL — ABNORMAL HIGH (ref 70–99)
Glucose-Capillary: 62 mg/dL — ABNORMAL LOW (ref 70–99)
Glucose-Capillary: 100 mg/dL — ABNORMAL HIGH (ref 70–99)

## 2019-06-08 MED ORDER — TRAVASOL 10 % IV SOLN
INTRAVENOUS | Status: AC
  Filled 2019-06-08: qty 1080

## 2019-06-08 NOTE — Progress Notes (Signed)
CRITICAL VALUE ALERT  Critical Value: CBG:62  Date & Time Notied: 06/08/19 1800  Orders Received/Actions taken: Carb snack given. No symptoms of hypoglycemia assessed or reported. CBG now 100.

## 2019-06-08 NOTE — Progress Notes (Signed)
PHARMACY - TOTAL PARENTERAL NUTRITION CONSULT NOTE   Indication: Fistula  Patient Measurements: Height: 5\' 2"  (157.5 cm) Weight: 134 lb 7.7 oz (61 kg) IBW/kg (Calculated) : 50.1 TPN AdjBW (KG): 54.6 Body mass index is 24.6 kg/m.  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  Significant history of DM with bilateral BKA.  Presenting from SNF with MRSA bacteremia, suspected source from PICC.  Has been on oral diet this admission with line holiday however decreased appetite and may not be absorbing much per RD, unable to meet requirements.  Moderate malnutrition of chronic disease per RD assessment.     Glucose / Insulin: History of DM and requirement of insulin in past TPNs. Previously this admission on D10 to maintain euglycemia before TPN start. Current CBGs 86-153, required 3 units of Novolog insulin in last 24 hours. Insulin requirement is not high enough to justify insulin in TPN.  Electrolytes: No new labs 3/28 -  Na low at 133, K 4.6, CO2 23, Mag 1.8, Phos 3.5, and CoCa 10.7.  Renal: SCr 0.78- stable. BUN up 60.  LFTs / TGs: LFTs/Tbili within normal limits. TG 181>101.  Prealbumin / albumin: Prealbumin 6/ Albumin 1.4 Intake / Output; MIVF: UOP 1 mL/kg/hr. Colostomy output 800 mL - on Imodium 2mg  every 8 hours. JP drain 0 mL.  GI Imaging: none since start of TPN Surgeries / Procedures: none since start of TPN  Central access: PICC replaced 05/29/19 TPN start date: 05/29/19  Nutritional Goals (per RD recommendation on 3/18): kCal: 1800-2000, Protein: 83-110g, Fluid: >= 1.9L Goal TPN rate is 75 mL/hr (provides 108 g of protein and 1819 kcals per day)  Current Nutrition:   Regular diet --intake 0-10% of meals  Cycling TPN for discharge home    Plan:  Change to cyclic TPN over 14 hours - rate of 69 mL/hr x 1 hour, the 138 ml/hr x 12 hrs, then 69 ml/hr x 1 hour then off. Electrolytes in TPN: 60 mEq/L of Na, 18 mEq/L  of K (32 mEq/day), 2.5 mEq/L of Ca, 6 mEq/L of Mg, and decrease to 7.5 mmol/L of Phos. Cl: Acetate 1:2. Patient receiving oral MVI which contains trace - will not add to TPN.  Continue coverage with Moderate q6h SSI and adjust as needed. TPN labs Mon/Thurs.   Monitor po intake (starting to increase) and plan for TPN long-term/discharge.    Alanda Slim, PharmD, [facility] Clinical Pharmacist Please see AMION for all Pharmacists' Contact Phone Numbers 06/08/2019, 7:19 AM

## 2019-06-08 NOTE — Progress Notes (Signed)
PROGRESS NOTE    Gina [name]  YQI:[id number] DOB: 1960-09-18 DOA: 05/22/2019 PCP: [name], MD   Brief Narrative: 59 year old female with DM, HTN, schizophrenia, bilateral BKA sent from [facility] due to positive blood culture. She was recently admitted  on November 2020 for perforated prepyloric ulcer with septic shock with acute peritonitis status post exlap with [name] patch on 11/23, and was [facility] on Abx and antifungal for total 6 weeks. Patient was discharged with Foley catheter, PICC line, bilateral lower quadrant drains along with enterocutaneous fistula with colostomy bag . She was discharged from [facility] to [facility] whiere she has been living for last 3 months. Patient was sent to ER for vague sleepiness, malaise ,anorexia low-grade fever and leukocytosis at SNF, and in the ER exam labs were  unremarkable and patient was sent back to SNF but overnight blood culture showed MRSA and she was readmitted to the hospital. Patient is being followed by infectious disease, general surgery.She did well with speech, diet was restarted.Foley catheter was exchanged. Repeat blood culture negative from 3/2. ID has advised to continue vancomycin through 3/25 and ordered PICC line.Surgery has been following patient and on calorie count,  Patient and family planning to return home with home health services upon discharge and has declined skilled nursing facility placement.  She has been started back on TPN.  She was febrile last weekend.  Afebrile since last 3 days.  General surgery following. General surgery has cleared for discharge.  Patient and daughter want home health.  She needs TPN at home.  She is medically stable for discharge as soon as home health and other requirements arranged.  Assessment & Plan:   Principal Problem:   MRSA bacteremia Active Problems:   HTN (hypertension)   Diabetes ([place])   Severe protein-calorie malnutrition ([place])   Anemia of  chronic disease   Type 2 diabetes mellitus with hyperosmolar nonketotic hyperglycemia (HCC)   Pressure injury of right thigh, unstageable (HCC)   Below-knee amputee ([place])   Atherosclerosis of native arteries of extremities with gangrene, right leg (HCC)   Intra-abdominal fluid collection   Palliative care by specialist   Goals of care, counseling/discussion   DNR (do not resuscitate)   Physical deconditioning   Dysphagia   Malnutrition of moderate degree  MSSA bacteremia: Indwelling PICC line used for TPN was felt to be the likely source, PICC line was removed.  Repeat blood cultures have been negative.  New PICC line placed on 3/18.  Completed vancomycin course on 3/25.  ID was following.  New cultures have been sent, so far negative.  Chest x-ray did not show any pneumonia.Afebrile since last several days  Intra-abdominal fluid collection: Patient with history of exploratory laparotomy/partial small bowel resection for intra-abdominal abscess in November.  She has Eakins pouch and LLQ drain. She was admitted in November 2020 for perforated prepyloric ulcer with septic shock, acute peritonitis.  Status post exploratory laparotomy with [name] patch. She had 2 separate enterocutaneous fistula.  She might need injection studies to further characterize fistulas in the future as well as EGD.  Moderate protein calorie malnutrition: Dietitian following.  Currently on TPN.Also on oral diet.  Currently having loose stools.  On Imodium  Hypertension: Currently blood pressure well controlled.  On amlodipine  Diabetes type 2: Hemoglobin A1c of 5.2 as per  3/12.  Currently on sliding scale insulin.  Normocytic anemia: Has been transfused with several units of PRBC during the hospitalization.  Currently hemoglobin stable  GERD: Continue PPI  Mood disorder: Continue Celexa  Goals of care: Patient is DNR.  Palliative care was following.  Recommended to follow-up as an outpatient.  Pressure Injury  02/03/19 Coccyx Right;Left;Medial Unstageable - Full thickness tissue loss in which the base of the ulcer is covered by slough (yellow, tan, gray, green or brown) and/or eschar (tan, brown or black) in the wound bed. pt has 3 small injuri (Active)  02/03/19 0230  Location: Coccyx  Location Orientation: Right;Left;Medial  Staging: Unstageable - Full thickness tissue loss in which the base of the ulcer is covered by slough (yellow, tan, gray, green or brown) and/or eschar (tan, brown or black) in the wound bed.  Wound Description (Comments): pt has 3 small injuries all in the same area, one is medial and two are on either side of it  Present on Admission: Yes     Pressure Injury 05/22/19 Coccyx Bilateral Stage 2 -  Partial thickness loss of dermis presenting as a shallow open injury with a red, pink wound bed without slough. (Active)  05/22/19 2104  Location: Coccyx  Location Orientation: Bilateral  Staging: Stage 2 -  Partial thickness loss of dermis presenting as a shallow open injury with a red, pink wound bed without slough.  Wound Description (Comments):   Present on Admission: Yes     Pressure Injury 05/22/19 Knee Right;Distal Unstageable - Full thickness tissue loss in which the base of the injury is covered by slough (yellow, tan, gray, green or brown) and/or eschar (tan, brown or black) in the wound bed. Stump (Active)  05/22/19 2106  Location: Knee  Location Orientation: Right;Distal  Staging: Unstageable - Full thickness tissue loss in which the base of the injury is covered by slough (yellow, tan, gray, green or brown) and/or eschar (tan, brown or black) in the wound bed.  Wound Description (Comments): Stump  Present on Admission: Yes     Pressure Injury 05/22/19 Knee Left;Distal Unstageable - Full thickness tissue loss in which the base of the injury is covered by slough (yellow, tan, gray, green or brown) and/or eschar (tan, brown or black) in the wound bed. Stump (Active)    05/22/19 2108  Location: Knee  Location Orientation: Left;Distal  Staging: Unstageable - Full thickness tissue loss in which the base of the injury is covered by slough (yellow, tan, gray, green or brown) and/or eschar (tan, brown or black) in the wound bed.  Wound Description (Comments): Stump  Present on Admission: Yes     Pressure Injury 05/22/19 Thigh Left;Mid Stage 2 -  Partial thickness loss of dermis presenting as a shallow open injury with a red, pink wound bed without slough. (Active)  05/22/19 2109  Location: Thigh  Location Orientation: Left;Mid  Staging: Stage 2 -  Partial thickness loss of dermis presenting as a shallow open injury with a red, pink wound bed without slough.  Wound Description (Comments):   Present on Admission: Yes         Nutrition Problem: Moderate Malnutrition Etiology: chronic illness(EC fistula)      DVT prophylaxis:Lovenox Code Status: DNR Family Communication: Called daughter and discussed on the phone on 06/03/2019 Disposition Plan: Patient is from skilled nursing facility.  PT/OT recommending skilled nursing facility.   Patient and her daughter deny skilled nursing facility and want to go home with home health.  She is medically stable for discharge from medical perspective.  Waiting for home health arrangement.  Consultants: General surgery, ID  Procedures:As above  Antimicrobials:  Anti-infectives (From admission, onward)   Start     Dose/Rate Route Frequency Ordered Stop   06/01/19 1200  vancomycin (VANCOCIN) IVPB 1000 mg/200 mL premix  Status:  Discontinued     1,000 mg 200 mL/hr over 60 Minutes Intravenous Every 48 hours 05/31/19 2352 06/01/19 0813   06/01/19 1200  vancomycin (VANCOREADY) IVPB 1250 mg/250 mL     1,250 mg 166.7 mL/hr over 90 Minutes Intravenous Every 48 hours 06/01/19 0813 06/05/19 1401   05/31/19 1438  vancomycin variable dose per unstable renal function (pharmacist dosing)  Status:  Discontinued      Does not  apply See admin instructions 05/31/19 1438 05/31/19 2351   05/25/19 2200  vancomycin (VANCOCIN) IVPB 1000 mg/200 mL premix  Status:  Discontinued     1,000 mg 200 mL/hr over 60 Minutes Intravenous Every 24 hours 05/25/19 0958 05/31/19 1438   05/24/19 2200  vancomycin (VANCOREADY) IVPB 750 mg/150 mL  Status:  Discontinued     750 mg 150 mL/hr over 60 Minutes Intravenous Every 24 hours 05/24/19 1156 05/25/19 0958   05/23/19 2200  vancomycin (VANCOCIN) IVPB 1000 mg/200 mL premix     1,000 mg 200 mL/hr over 60 Minutes Intravenous  Once 05/23/19 1345 05/24/19 0008   05/23/19 1113  vancomycin variable dose per unstable renal function (pharmacist dosing)  Status:  Discontinued      Does not apply See admin instructions 05/23/19 1114 05/24/19 1156   05/22/19 1500  vancomycin (VANCOREADY) IVPB 1250 mg/250 mL  Status:  Discontinued     1,250 mg 166.7 mL/hr over 90 Minutes Intravenous Every 24 hours 05/22/19 1429 05/23/19 1114      Subjective:  Patient seen and examined the bedside this morning.  Hemodynamically stable. Objective: Vitals:   06/07/19 1721 06/07/19 2230 06/08/19 0621 06/08/19 0833  BP: (!) 141/68 140/68 (!) 129/59 (!) 125/58  Pulse: 76 75 69 75  Resp: 16 18 20 18   Temp: 99.1 F (37.3 C) (!) 97.4 F (36.3 C) 98.9 F (37.2 C) 98.4 F (36.9 C)  TempSrc: Oral Oral Oral Oral  SpO2: 94% 94% 94% 93%  Weight:      Height:        Intake/Output Summary (Last 24 hours) at 06/08/2019 0840 Last data filed at 06/08/2019 0600 Gross per 24 hour  Intake 1024.49 ml  Output 3120 ml  Net -2095.51 ml   Filed Weights   05/30/19 2058 06/02/19 2124 06/05/19 2137  Weight: 61 kg 55 kg 61 kg    Examination:  General exam: Chronically ill looking, frail, cachectic Respiratory system: no wheezes or crackles  Cardiovascular system: S1 & S2 heard, RRR. No JVD, murmurs, rubs, gallops or clicks. Gastrointestinal system: Abdomen is nondistended, soft and nontender.eakin's pouch with brown  liquid stool, drain on the left lower quadrant   Central nervous system: Alert and oriented. Extremities: Bilateral BKA Skin: Minor skin breakdowns  Data Reviewed: I have personally reviewed following labs and imaging studies  CBC: Recent Labs  Lab 06/02/19 0415 06/08/19 0347  WBC 9.4 7.8  NEUTROABS 7.8* 5.1  HGB 8.9* 7.8*  HCT 28.3* 25.1*  MCV 94.3 93.7  PLT 318 867   Basic Metabolic Panel: Recent Labs  Lab 06/02/19 0415 06/03/19 0412 06/05/19 0323 06/07/19 0420  NA 130* 131* 131* 133*  K 5.0 4.9 4.9 4.6  CL 105 105 103 104  CO2 19* 21* 23 23  GLUCOSE 185* 90 186* 142*  BUN 45* 57* 62* 60*  CREATININE 0.82 0.88 0.87  0.78  CALCIUM 8.5* 8.5* 8.6* 9.0  MG 2.1 2.1 2.0 1.8  PHOS 4.4 4.5 4.5 3.5   GFR: Estimated Creatinine Clearance: 65.9 mL/min (by C-G formula based on SCr of 0.78 mg/dL). Liver Function Tests: Recent Labs  Lab 06/02/19 0415 06/05/19 0323  AST 10* 17  ALT 9 16  ALKPHOS 104 150*  BILITOT 0.6 0.5  PROT 6.1* 6.5  ALBUMIN 1.3* 1.4*   No results for input(s): LIPASE, AMYLASE in the last 168 hours. No results for input(s): AMMONIA in the last 168 hours. Coagulation Profile: No results for input(s): INR, PROTIME in the last 168 hours. Cardiac Enzymes: No results for input(s): CKTOTAL, CKMB, CKMBINDEX, TROPONINI in the last 168 hours. BNP (last 3 results) No results for input(s): PROBNP in the last 8760 hours. HbA1C: No results for input(s): HGBA1C in the last 72 hours. CBG: Recent Labs  Lab 06/07/19 0534 06/07/19 1159 06/07/19 1723 06/07/19 2352 06/08/19 0719  GLUCAP 156* 103* 86 153* 163*   Lipid Profile: No results for input(s): CHOL, HDL, LDLCALC, TRIG, CHOLHDL, LDLDIRECT in the last 72 hours. Thyroid Function Tests: No results for input(s): TSH, T4TOTAL, FREET4, T3FREE, THYROIDAB in the last 72 hours. Anemia Panel: No results for input(s): VITAMINB12, FOLATE, FERRITIN, TIBC, IRON, RETICCTPCT in the last 72 hours. Sepsis Labs: No  results for input(s): PROCALCITON, LATICACIDVEN in the last 168 hours.  Recent Results (from the past 240 hour(s))  Culture, blood (routine x 2)     Status: None   Collection Time: 06/01/19  9:13 AM   Specimen: BLOOD RIGHT HAND  Result Value Ref Range Status   Specimen Description BLOOD RIGHT HAND  Final   Special Requests   Final    BOTTLES DRAWN AEROBIC ONLY Blood Culture adequate volume   Culture   Final    NO GROWTH 5 DAYS Performed at [facility], [address], [place], [place] [address]    Report Status 06/06/2019 FINAL  Final  Culture, blood (routine x 2)     Status: None   Collection Time: 06/01/19  9:13 AM   Specimen: BLOOD  Result Value Ref Range Status   Specimen Description BLOOD LEFT ANTECUBITAL  Final   Special Requests   Final    BOTTLES DRAWN AEROBIC ONLY Blood Culture results may not be optimal due to an inadequate volume of blood received in culture bottles   Culture   Final    NO GROWTH 5 DAYS Performed at [facility], [place] [address], [place], [place] [address]    Report Status 06/06/2019 FINAL  Final         Radiology Studies: No results found.      Scheduled Meds: . amLODipine  5 mg Oral Daily  . chlorhexidine  15 mL Mouth Rinse BID  . Chlorhexidine Gluconate Cloth  6 each Topical Daily  . enoxaparin (LOVENOX) injection  40 mg Subcutaneous Q24H  . escitalopram  10 mg Oral Daily  . gabapentin  300 mg Oral BID  . insulin aspart  0-15 Units Subcutaneous Q6H  . loperamide  2 mg Oral Q8H  . mouth rinse  15 mL Mouth Rinse q12n4p  . melatonin  3 mg Oral QHS  . metoprolol tartrate  50 mg Oral BID  . multivitamin with minerals  1 tablet Oral Daily  . pantoprazole  40 mg Oral Daily  . simethicone  80 mg Oral QID   Continuous Infusions: . TPN CYCLIC-ADULT (ION)  LOS: 17 days    Time spent: 25 mins, More than 50% of that time was spent in counseling and/or coordination of care.      Shelly Coss, MD [facility] P3/28/2021, 8:40 AM

## 2019-06-09 LAB — COMPREHENSIVE METABOLIC PANEL
ALT: 26 U/L (ref 0–44)
AST: 25 U/L (ref 15–41)
Albumin: 1.4 g/dL — ABNORMAL LOW (ref 3.5–5.0)
Alkaline Phosphatase: 179 U/L — ABNORMAL HIGH (ref 38–126)
Anion gap: 5 (ref 5–15)
BUN: 68 mg/dL — ABNORMAL HIGH (ref 6–20)
CO2: 25 mmol/L (ref 22–32)
Calcium: 9 mg/dL (ref 8.9–10.3)
Chloride: 106 mmol/L (ref 98–111)
Creatinine, Ser: 0.92 mg/dL (ref 0.44–1.00)
GFR calc Af Amer: >60 mL/min (ref >60)
GFR calc non Af Amer: >60 mL/min (ref >60)
Glucose, Bld: 108 mg/dL — ABNORMAL HIGH (ref 70–99)
Potassium: 4.4 mmol/L (ref 3.5–5.1)
Sodium: 136 mmol/L (ref 135–145)
Total Bilirubin: 0.9 mg/dL (ref 0.3–1.2)
Total Protein: 6.4 g/dL — ABNORMAL LOW (ref 6.5–8.1)

## 2019-06-09 LAB — DIFFERENTIAL
Abs Immature Granulocytes: 0.02 10*3/uL (ref 0.00–0.07)
Basophils Absolute: 0 10*3/uL (ref 0.0–0.1)
Basophils Relative: 0 %
Eosinophils Absolute: 0.2 10*3/uL (ref 0.0–0.5)
Eosinophils Relative: 2 %
Immature Granulocytes: 0 %
Lymphocytes Relative: 23 %
Lymphs Abs: 1.7 10*3/uL (ref 0.7–4.0)
Monocytes Absolute: 0.8 10*3/uL (ref 0.1–1.0)
Monocytes Relative: 11 %
Neutro Abs: 4.5 10*3/uL (ref 1.7–7.7)
Neutrophils Relative %: 64 %

## 2019-06-09 LAB — PHOSPHORUS: Phosphorus: 4 mg/dL (ref 2.5–4.6)

## 2019-06-09 LAB — CBC
HCT: 24.2 % — ABNORMAL LOW (ref 36.0–46.0)
Hemoglobin: 7.5 g/dL — ABNORMAL LOW (ref 12.0–15.0)
MCH: 29 pg (ref 26.0–34.0)
MCHC: 31 g/dL (ref 30.0–36.0)
MCV: 93.4 fL (ref 80.0–100.0)
Platelets: 319 10*3/uL (ref 150–400)
RBC: 2.59 MIL/uL — ABNORMAL LOW (ref 3.87–5.11)
RDW: 15.4 % (ref 11.5–15.5)
WBC: 7.2 10*3/uL (ref 4.0–10.5)
nRBC: 0 % (ref 0.0–0.2)

## 2019-06-09 LAB — TRIGLYCERIDES: Triglycerides: 86 mg/dL (ref <150)

## 2019-06-09 LAB — MAGNESIUM: Magnesium: 1.9 mg/dL (ref 1.7–2.4)

## 2019-06-09 LAB — GLUCOSE, CAPILLARY
Glucose-Capillary: 123 mg/dL — ABNORMAL HIGH (ref 70–99)
Glucose-Capillary: 127 mg/dL — ABNORMAL HIGH (ref 70–99)
Glucose-Capillary: 129 mg/dL — ABNORMAL HIGH (ref 70–99)
Glucose-Capillary: 90 mg/dL (ref 70–99)

## 2019-06-09 LAB — PREALBUMIN: Prealbumin: 9.3 mg/dL — ABNORMAL LOW (ref 18–38)

## 2019-06-09 MED ORDER — TRAVASOL 10 % IV SOLN
INTRAVENOUS | Status: AC
  Filled 2019-06-09: qty 1101.6

## 2019-06-09 MED ORDER — WHITE PETROLATUM EX OINT
TOPICAL_OINTMENT | CUTANEOUS | Status: AC
  Filled 2019-06-09: qty 28.35

## 2019-06-09 MED ORDER — INSULIN ASPART 100 UNIT/ML ~~LOC~~ SOLN
0.0000 [IU] | Freq: Four times a day (QID) | SUBCUTANEOUS | Status: DC
  Administered 2019-06-09 (×2): 1 [IU] via SUBCUTANEOUS
  Administered 2019-06-10 (×2): 2 [IU] via SUBCUTANEOUS

## 2019-06-09 NOTE — TOC Progression Note (Signed)
Transition of Care [facility]) - Progression Note    Patient Details  Name: Gina [name] MRN: [id number] Date of Birth: December 16, 1960  Transition of Care [facility] CM/SW Contact  Bartholomew Crews, RN Phone Number: [phone] 06/09/2019, 10:23 AM  Clinical Narrative:    Spoke with liaison at [facility] about DME orders for hospital bed/hoyer lift and wheelchair. Request to add to comments for wheelchair cushion "needs roho cushion d/t pressure injury." This was updated. Daughter, [name], will visit this afternoon to discuss teaching for eakins pouch. TOC team following for transition needs.    Expected Discharge Plan: Skilled Nursing Facility(Definite plan not decided yet by patient/adult children. It is between returning to SNF or going home with services (possibly PCS/HH)) Barriers to Discharge: Continued Medical Work up  Expected Discharge Plan and Services Expected Discharge Plan: Skilled Nursing Facility(Definite plan not decided yet by patient/adult children. It is between returning to SNF or going home with services (possibly PCS/HH))                                               Social Determinants of Health (SDOH) Interventions    Readmission Risk Interventions Readmission Risk Prevention Plan 02/10/2019 11/07/2017  Post Dischage Appt - Patient refused  Medication Screening - Complete  Transportation Screening Complete Complete  PCP follow-up - Patient refused  PCP or Specialist Appt within 3-5 Days Not Complete -  Not Complete comments DC date unknown- pt established with PCP -  [place] or [place] Complete -  Social Work Consult for [place] Planning/Counseling Not Complete -  SW consult not completed comments awaiting call back from daughter and APS -  Palliative Care Screening Not Complete -  Palliative Care Screening Not Complete Comments pending need -  Medication Review ([place]) Referral to Pharmacy -  Some recent data might be hidden

## 2019-06-09 NOTE — Progress Notes (Signed)
PROGRESS NOTE    Gina [name]  JSE:[id number] DOB: [birth year] DOA: 05/22/2019 PCP: [name], MD   Brief Narrative: 59 year old female with DM, HTN, schizophrenia, bilateral BKA sent from [facility] due to positive blood culture. She was recently admitted  on November 2020 for perforated prepyloric ulcer with septic shock with acute peritonitis status post exlap with [name] patch on 11/23, and was discharged to [facility] on Abx and antifungal for total 6 weeks. Patient was discharged with Foley catheter, PICC line, bilateral lower quadrant drains along with enterocutaneous fistula with colostomy bag . She was discharged from [facility]iere she has been living for last 3 months. Patient was sent to ER for vague sleepiness, malaise ,anorexia low-grade fever and leukocytosis at SNF, and in the ER exam labs were  unremarkable and patient was sent back to SNF but overnight blood culture showed MRSA and she was readmitted to the hospital. Patient is being followed by infectious disease, general surgery.She did well with speech, diet was restarted.Foley catheter was exchanged. Repeat blood culture negative from 3/2. ID has advised to continue vancomycin through 3/25 and ordered PICC line.Surgery has been following patient and on calorie count,  Patient and family planning to return home with home health services upon discharge and has declined skilled nursing facility placement.  She has been started back on TPN.  She was febrile last weekend.  Afebrile since last 3 days.  General surgery following. General surgery has cleared for discharge.  Patient and daughter want home health.  She needs TPN at home.  She is medically stable for discharge as soon as home health and other requirements arranged.  Assessment & Plan:   Principal Problem:   MRSA bacteremia Active Problems:   HTN (hypertension)   Diabetes ([place])   Severe protein-calorie malnutrition ([place])   Anemia of  chronic disease   Type 2 diabetes mellitus with hyperosmolar nonketotic hyperglycemia (HCC)   Pressure injury of right thigh, unstageable (HCC)   Below-knee amputee ([place])   Atherosclerosis of native arteries of extremities with gangrene, right leg (HCC)   Intra-abdominal fluid collection   Palliative care by specialist   Goals of care, counseling/discussion   DNR (do not resuscitate)   Physical deconditioning   Dysphagia   Malnutrition of moderate degree  MSSA bacteremia: Indwelling PICC line used for TPN was felt to be the likely source, PICC line was removed.  Repeat blood cultures have been negative.  New PICC line placed on 3/18.  Completed vancomycin course on 3/25.  ID was following.  New cultures have been sent, so far negative.  Chest x-ray did not show any pneumonia.Afebrile since last several days  Intra-abdominal fluid collection: Patient with history of exploratory laparotomy/partial small bowel resection for intra-abdominal abscess in November.  She has Eakins pouch and LLQ drain. She was admitted in November 2020 for perforated prepyloric ulcer with septic shock, acute peritonitis.  Status post exploratory laparotomy with [name] patch. She had 2 separate enterocutaneous fistula.  She might need injection studies to further characterize fistulas in the future as well as EGD.  Moderate protein calorie malnutrition: Dietitian following.  Currently on TPN.Also on oral diet.  Currently having loose stools.  On Imodium  Hypertension: Currently blood pressure well controlled.  On amlodipine  Diabetes type 2: Hemoglobin A1c of 5.2 as per  3/12.  Currently on sliding scale insulin.  Normocytic anemia: Has been transfused with several units of PRBC during the hospitalization.  Currently hemoglobin stable  GERD: Continue PPI  Mood disorder: Continue Celexa  Goals of care: Patient is DNR.  Palliative care was following.  Recommended to follow-up as an outpatient.  Pressure Injury  05/22/19 Coccyx Bilateral Stage 2 -  Partial thickness loss of dermis presenting as a shallow open injury with a red, pink wound bed without slough. (Active)  05/22/19 2104  Location: Coccyx  Location Orientation: Bilateral  Staging: Stage 2 -  Partial thickness loss of dermis presenting as a shallow open injury with a red, pink wound bed without slough.  Wound Description (Comments):   Present on Admission: Yes     Pressure Injury 05/22/19 Knee Right;Distal Unstageable - Full thickness tissue loss in which the base of the injury is covered by slough (yellow, tan, gray, green or brown) and/or eschar (tan, brown or black) in the wound bed. Stump (Active)  05/22/19 2106  Location: Knee  Location Orientation: Right;Distal  Staging: Unstageable - Full thickness tissue loss in which the base of the injury is covered by slough (yellow, tan, gray, green or brown) and/or eschar (tan, brown or black) in the wound bed.  Wound Description (Comments): Stump  Present on Admission: Yes     Pressure Injury 05/22/19 Knee Left;Distal Unstageable - Full thickness tissue loss in which the base of the injury is covered by slough (yellow, tan, gray, green or brown) and/or eschar (tan, brown or black) in the wound bed. Stump (Active)  05/22/19 2108  Location: Knee  Location Orientation: Left;Distal  Staging: Unstageable - Full thickness tissue loss in which the base of the injury is covered by slough (yellow, tan, gray, green or brown) and/or eschar (tan, brown or black) in the wound bed.  Wound Description (Comments): Stump  Present on Admission: Yes     Pressure Injury 05/22/19 Thigh Left;Mid Stage 2 -  Partial thickness loss of dermis presenting as a shallow open injury with a red, pink wound bed without slough. (Active)  05/22/19 2109  Location: Thigh  Location Orientation: Left;Mid  Staging: Stage 2 -  Partial thickness loss of dermis presenting as a shallow open injury with a red, pink wound bed without  slough.  Wound Description (Comments):   Present on Admission: Yes         Nutrition Problem: Moderate Malnutrition Etiology: chronic illness(EC fistula)      DVT prophylaxis:Lovenox Code Status: DNR Family Communication: Called daughter and discussed on the phone on 06/03/2019 Disposition Plan: Patient is from skilled nursing facility.  PT/OT recommending skilled nursing facility.   Patient and her daughter deny skilled nursing facility and want to go home with home health.  She is medically stable for discharge from medical perspective.  Waiting for home health arrangement/daughter being taught for assistance at home.  Consultants: General surgery, ID  Procedures:As above  Antimicrobials:  Anti-infectives (From admission, onward)   Start     Dose/Rate Route Frequency Ordered Stop   06/01/19 1200  vancomycin (VANCOCIN) IVPB 1000 mg/200 mL premix  Status:  Discontinued     1,000 mg 200 mL/hr over 60 Minutes Intravenous Every 48 hours 05/31/19 2352 06/01/19 0813   06/01/19 1200  vancomycin (VANCOREADY) IVPB 1250 mg/250 mL     1,250 mg 166.7 mL/hr over 90 Minutes Intravenous Every 48 hours 06/01/19 0813 06/05/19 1401   05/31/19 1438  vancomycin variable dose per unstable renal function (pharmacist dosing)  Status:  Discontinued      Does not apply See admin instructions 05/31/19 1438 05/31/19 2351   05/25/19 2200  vancomycin (VANCOCIN) IVPB 1000 mg/200 mL premix  Status:  Discontinued     1,000 mg 200 mL/hr over 60 Minutes Intravenous Every 24 hours 05/25/19 0958 05/31/19 1438   05/24/19 2200  vancomycin (VANCOREADY) IVPB 750 mg/150 mL  Status:  Discontinued     750 mg 150 mL/hr over 60 Minutes Intravenous Every 24 hours 05/24/19 1156 05/25/19 0958   05/23/19 2200  vancomycin (VANCOCIN) IVPB 1000 mg/200 mL premix     1,000 mg 200 mL/hr over 60 Minutes Intravenous  Once 05/23/19 1345 05/24/19 0008   05/23/19 1113  vancomycin variable dose per unstable renal function  (pharmacist dosing)  Status:  Discontinued      Does not apply See admin instructions 05/23/19 1114 05/24/19 1156   05/22/19 1500  vancomycin (VANCOREADY) IVPB 1250 mg/250 mL  Status:  Discontinued     1,250 mg 166.7 mL/hr over 90 Minutes Intravenous Every 24 hours 05/22/19 1429 05/23/19 1114      Subjective:  Patient seen and examined the bedside this morning.  Hemodynamically stable.  Comfortable.  Denies any abdominal pain, nausea or vomiting.  Eager to go home.  Objective: Vitals:   06/08/19 1803 06/08/19 2059 06/09/19 0542 06/09/19 1010  BP: (!) 100/55 128/71 (!) 131/58 116/67  Pulse: 73 75 77 76  Resp: 18 16 18 16   Temp: 98.9 F (37.2 C) 98.6 F (37 C) 98.3 F (36.8 C) 98.9 F (37.2 C)  TempSrc: Oral Oral Oral Oral  SpO2: 90% 92% 94% 95%  Weight:      Height:        Intake/Output Summary (Last 24 hours) at 06/09/2019 1312 Last data filed at 06/09/2019 0900 Gross per 24 hour  Intake 1776.18 ml  Output 1110 ml  Net 666.18 ml   Filed Weights   05/30/19 2058 06/02/19 2124 06/05/19 2137  Weight: 61 kg 55 kg 61 kg    Examination:   General exam: Chronically looking, frail, cachectic, malnourished Respiratory system:  no wheezes or crackles  Cardiovascular system: S1 & S2 heard, RRR.  Gastrointestinal system: Abdomen is nondistended, soft and nontender. eakin's pouch with brown liquid stool, drain on the left lower quadrant   Central nervous system: Alert and oriented. No focal neurological deficits. Extremities: Bilateral BKA Skin: Minor skin breakdowns  Data Reviewed: I have personally reviewed following labs and imaging studies  CBC: Recent Labs  Lab 06/08/19 0347 06/09/19 0406  WBC 7.8 7.2  NEUTROABS 5.1 4.5  HGB 7.8* 7.5*  HCT 25.1* 24.2*  MCV 93.7 93.4  PLT 309 626   Basic Metabolic Panel: Recent Labs  Lab 06/03/19 0412 06/05/19 0323 06/07/19 0420 06/09/19 0406  NA 131* 131* 133* 136  K 4.9 4.9 4.6 4.4  CL 105 103 104 106  CO2 21* 23 23  25   GLUCOSE 90 186* 142* 108*  BUN 57* 62* 60* 68*  CREATININE 0.88 0.87 0.78 0.92  CALCIUM 8.5* 8.6* 9.0 9.0  MG 2.1 2.0 1.8 1.9  PHOS 4.5 4.5 3.5 4.0   GFR: Estimated Creatinine Clearance: 57.3 mL/min (by C-G formula based on SCr of 0.92 mg/dL). Liver Function Tests: Recent Labs  Lab 06/05/19 0323 06/09/19 0406  AST 17 25  ALT 16 26  ALKPHOS 150* 179*  BILITOT 0.5 0.9  PROT 6.5 6.4*  ALBUMIN 1.4* 1.4*   No results for input(s): LIPASE, AMYLASE in the last 168 hours. No results for input(s): AMMONIA in the last 168 hours. Coagulation Profile: No results for input(s): INR, PROTIME in the  last 168 hours. Cardiac Enzymes: No results for input(s): CKTOTAL, CKMB, CKMBINDEX, TROPONINI in the last 168 hours. BNP (last 3 results) No results for input(s): PROBNP in the last 8760 hours. HbA1C: No results for input(s): HGBA1C in the last 72 hours. CBG: Recent Labs  Lab 06/08/19 1756 06/08/19 1900 06/09/19 0017 06/09/19 0551 06/09/19 1149  GLUCAP 62* 100* 129* 127* 123*   Lipid Profile: Recent Labs    06/09/19 0406  TRIG 86   Thyroid Function Tests: No results for input(s): TSH, T4TOTAL, FREET4, T3FREE, THYROIDAB in the last 72 hours. Anemia Panel: No results for input(s): VITAMINB12, FOLATE, FERRITIN, TIBC, IRON, RETICCTPCT in the last 72 hours. Sepsis Labs: No results for input(s): PROCALCITON, LATICACIDVEN in the last 168 hours.  Recent Results (from the past 240 hour(s))  Culture, blood (routine x 2)     Status: None   Collection Time: 06/01/19  9:13 AM   Specimen: BLOOD RIGHT HAND  Result Value Ref Range Status   Specimen Description BLOOD RIGHT HAND  Final   Special Requests   Final    BOTTLES DRAWN AEROBIC ONLY Blood Culture adequate volume   Culture   Final    NO GROWTH 5 DAYS Performed at [facility], [address], [place], [place] [address]    Report Status 06/06/2019 FINAL  Final  Culture, blood (routine x 2)     Status: None   Collection  Time: 06/01/19  9:13 AM   Specimen: BLOOD  Result Value Ref Range Status   Specimen Description BLOOD LEFT ANTECUBITAL  Final   Special Requests   Final    BOTTLES DRAWN AEROBIC ONLY Blood Culture results may not be optimal due to an inadequate volume of blood received in culture bottles   Culture   Final    NO GROWTH 5 DAYS Performed at [facility], [place] [address] [place], [place] [address]    Report Status 06/06/2019 FINAL  Final         Radiology Studies: No results found.      Scheduled Meds: . amLODipine  5 mg Oral Daily  . chlorhexidine  15 mL Mouth Rinse BID  . Chlorhexidine Gluconate Cloth  6 each Topical Daily  . enoxaparin (LOVENOX) injection  40 mg Subcutaneous Q24H  . escitalopram  10 mg Oral Daily  . gabapentin  300 mg Oral BID  . insulin aspart  0-9 Units Subcutaneous Q6H  . loperamide  2 mg Oral Q8H  . mouth rinse  15 mL Mouth Rinse q12n4p  . melatonin  3 mg Oral QHS  . metoprolol tartrate  50 mg Oral BID  . multivitamin with minerals  1 tablet Oral Daily  . pantoprazole  40 mg Oral Daily  . simethicone  80 mg Oral QID  . white petrolatum       Continuous Infusions: . TPN CYCLIC-ADULT (ION)       LOS: 18 days    Time spent: 25 mins, More than 50% of that time was spent in counseling and/or coordination of care.      Shelly Coss, MD [facility] P3/29/2021, 1:12 PM

## 2019-06-09 NOTE — Progress Notes (Signed)
PHARMACY - TOTAL PARENTERAL NUTRITION CONSULT NOTE   Indication: Fistula  Patient Measurements: Height: _0  (157.5 cm) Weight: 134 lb 7.7 oz (61 kg) IBW/kg (Calculated) : 50.1 TPN AdjBW (KG): 54.6 Body mass index is 24.6 kg/m.  Assessment:  75 YOF with hx of partial small bowel resection/EC fistula on chronic TPN since beginning of year.  Significant history of DM with bilateral BKA.  Presenting from SNF with MRSA bacteremia, suspected source from PICC.  Has been on oral diet this admission with line holiday however decreased appetite and may not be absorbing much per RD, unable to meet requirements.  Moderate malnutrition of chronic disease per RD assessment.     Glucose / Insulin: History of DM and requirement of insulin in past TPNs. Previously this admission on D10 to maintain euglycemia before TPN start. CBG controlled on TPN. Low of 62 off TPN (occurred with increase in cycle to 14 hrs; responded to carb snack). 4 units of Novolog insulin in last 24 hours. Insulin requirement is not high enough to justify insulin in TPN.  Electrolytes: Na improved at 136, K 4.4, CO2 25, Mag 1.9, Phos 4, and CoCa 11.1.  Renal: SCr up 0.92,  BUN up 68.  LFTs / TGs: AST/ALT/Tbili within normal limits. Alk Phos elevated at 179. TG 86. Prealbumin / albumin: Prealbumin 6>>9.3/ Albumin 1.4 Intake / Output; MIVF: UOP 0.9 mL/kg/hr. Colostomy output 100 mL (decreased) - on Imodium 52m every 8 hours. JP drain 10 mL. LLQ drain 0 mL. Net - 6L.  GI Imaging: none since start of TPN Surgeries / Procedures: none since start of TPN  Central access: PICC replaced 05/29/19 TPN start date: 05/29/19  Nutritional Goals (per RD recommendation on 3/25): kCal: 1800-2000, Protein: 83-110g, Fluid: >= 1.9L Goal TPN rate is 75 mL/hr (provides 110 g of protein and 1805 kcals per day)  Current Nutrition:   Regular diet --intake 0-10% of meals  Cycling TPN for discharge home    Plan:  Change to cyclic TPN over 14 hours -  rate of 69 mL/hr x 1 hour, the 138 ml/hr x 12 hrs, then 69 ml/hr x 1 hour then off.  *Increased protein and lipids and decreased dextrose in attempt to prevent significant lows off TPN after dextrose load of TPN.  Electrolytes in TPN: 60 mEq/L of Na, 18 mEq/L of K (32 mEq/day), remove Ca, 6 mEq/L of Mg, and 7.5 mmol/L of Phos. Cl: Acetate 1:2. Patient receiving oral MVI which contains trace - will not add to TPN.  Reduce coverage to Sensitive q6h SSI and adjust as needed. TPN labs Mon/Thurs.   Monitor po intake and plan for TPN long-term/discharge.  Monitor renal function (trend up) and electrolytes closely.  JSloan Leiter PharmD, BCPS, BCCCP Clinical Pharmacist Please refer to AVal[facility] Ltdfor MOricknumbers 06/09/2019, 7:45 AM

## 2019-06-09 NOTE — Progress Notes (Signed)
Physical Therapy Treatment Patient Details Name: Gina [name] MRN: [id number] DOB: 11-Feb-1961 Today's Date: 06/09/2019    History of Present Illness Pt is a 59 y/o female admitted from SNF secondary to abnormal lab values. Found to have MRSA bacteremia. Pt also with abdominal fistula and multiple abdominal drains secondary to previous complicated hospital admission. Pt now s/p 1 unit PRBC 3/18. PMH includes HTN, DM, bilateral BKA, and schizophrenia.    PT Comments    Pt agreeable to participate in PT, and progressed OOB to recliner via AP transfer and total +2 assist. Pt with significant weakness in UEs, so assisting in scooting and weight shifting is very difficult for pt at present. Pt sat EOB x10 minutes and engaged in both static and dynamic activities, including LAQs and closed chain partial ROM elbow extensions to aide in transfers in the future. PT feels SNF is safest d/c plan and will be most successful in increasing pt's mobility and independence, however pt remains set on d/c home.    Follow Up Recommendations  Home health PT;Supervision/Assistance - 24 hour(family and pt prefer home to SNF)     Equipment Recommendations  Other (comment);Hospital bed(pt needs power WC with tilt-in-space capabilities for pressure relief, as well as pressure relief mattress and hoyer lift for transfers)    Recommendations for Other Services       Precautions / Restrictions Precautions Precautions: Fall Precaution Comments: bilateral BKA, weeping from bilateral BKA wounds Restrictions Weight Bearing Restrictions: No    Mobility  Bed Mobility Overal bed mobility: Needs Assistance Bed Mobility: Rolling;Sidelying to Sit Rolling: Max assist Sidelying to sit: Max assist;HOB elevated       General bed mobility comments: Max assist for rolling and sidelying to sit for trunk and LE management, scooting to EOB with use of bed pads. Log roll technique encouraged to minimize shear on buttocks,  with pt initiating roll to L with use of UE on bedrail.  Transfers Overall transfer level: Needs assistance Equipment used: 2 person hand held assist Transfers: Comptroller transfers: Total assist;+2 physical assistance;+2 safety/equipment   General transfer comment: total assist +2 for AP scooting into recliner from bed, with verbal cuing for sequencing and using UEs to aide in scooting backwards. use of bed pads to lift pt into recliner to avoid gluteal shear.  Ambulation/Gait                 Stairs             Wheelchair Mobility    Modified Rankin (Stroke Patients Only)       Balance Overall balance assessment: Needs assistance Sitting-balance support: Bilateral upper extremity supported Sitting balance-Leahy Scale: Good                                      Cognition Arousal/Alertness: Awake/alert Behavior During Therapy: WFL for tasks assessed/performed Overall Cognitive Status: No family/caregiver present to determine baseline cognitive functioning                                 General Comments: pt expresses excitement about going home soon, periods of smiling and laughing followed by looking downcast      Exercises General Exercises - Lower Extremity Long Arc Quad: AROM;Both;10 reps;Seated    General Comments General comments (skin integrity, edema,  etc.): L residual limb wound weeping during and post-transfer. RN notified.      Pertinent Vitals/Pain Pain Assessment: Faces Faces Pain Scale: Hurts even more Pain Location: buttocks during AP transfer Pain Descriptors / Indicators: Grimacing;Discomfort;Sore Pain Intervention(s): Limited activity within patient's tolerance;Monitored during session;Repositioned    Home Living                      Prior Function            PT Goals (current goals can now be found in the care plan section) Acute Rehab PT  Goals Patient Stated Goal: wants to go home PT Goal Formulation: With patient Time For Goal Achievement: 06/16/19 Potential to Achieve Goals: Fair Progress towards PT goals: Progressing toward goals    Frequency    Min 2X/week      PT Plan Current plan remains appropriate    Co-evaluation              AM-PAC PT "6 Clicks" Mobility   Outcome Measure  Help needed turning from your back to your side while in a flat bed without using bedrails?: Total Help needed moving from lying on your back to sitting on the side of a flat bed without using bedrails?: Total Help needed moving to and from a bed to a chair (including a wheelchair)?: Total Help needed standing up from a chair using your arms (e.g., wheelchair or bedside chair)?: Total Help needed to walk in hospital room?: Total Help needed climbing 3-5 steps with a railing? : Total 6 Click Score: 6    End of Session   Activity Tolerance: Patient tolerated treatment well;Patient limited by fatigue Patient left: with call bell/phone within reach;in chair;with chair alarm set(with hoyer lift pad placed, pillow under pt buttocks for pressure relief) Nurse Communication: Mobility status PT Visit Diagnosis: Difficulty in walking, not elsewhere classified (R26.2);Pain Pain - Right/Left: (bilateral) Pain - part of body: (buttocks)     Time: 2229-7989 PT Time Calculation (min) (ACUTE ONLY): 32 min  Charges:  $Therapeutic Activity: 8-22 mins $Neuromuscular Re-education: 8-22 mins                     Suman Trivedi E, PT Acute Rehabilitation Services Pager [phone]  Office [phone]    Gillian Kluever D Abena Erdman 06/09/2019, 1:31 PM

## 2019-06-09 NOTE — Progress Notes (Signed)
Daughter, [name], visited this afternoon and was educated on how to empty the Eakin pouch as well as changing the pouch via teach back method. Pt verbalized understanding and demonstrated how to change the pouch.

## 2019-06-10 LAB — GLUCOSE, CAPILLARY
Glucose-Capillary: 162 mg/dL — ABNORMAL HIGH (ref 70–99)
Glucose-Capillary: 169 mg/dL — ABNORMAL HIGH (ref 70–99)
Glucose-Capillary: 95 mg/dL (ref 70–99)

## 2019-06-10 MED ORDER — MELATONIN 3 MG PO TABS: 3.0000 mg | ORAL_TABLET | Freq: Every day | ORAL | 1 refills | Status: AC

## 2019-06-10 MED ORDER — METOPROLOL TARTRATE 50 MG PO TABS: 50.0000 mg | ORAL_TABLET | Freq: Two times a day (BID) | ORAL | 1 refills | Status: AC

## 2019-06-10 MED ORDER — TRAVASOL 10 % IV SOLN
INTRAVENOUS | Status: DC
  Filled 2019-06-10: qty 1101.6

## 2019-06-10 MED ORDER — PANTOPRAZOLE SODIUM 40 MG PO TBEC: 40.0000 mg | DELAYED_RELEASE_TABLET | Freq: Every day | ORAL | 1 refills | Status: AC

## 2019-06-10 MED ORDER — GABAPENTIN 300 MG PO CAPS: 300.0000 mg | ORAL_CAPSULE | Freq: Two times a day (BID) | ORAL | 1 refills | Status: AC

## 2019-06-10 MED ORDER — LOPERAMIDE HCL 2 MG PO CAPS: 2.0000 mg | ORAL_CAPSULE | Freq: Three times a day (TID) | ORAL | 1 refills | Status: DC

## 2019-06-10 MED ORDER — OXYCODONE HCL 5 MG PO TABS: 5.0000 mg | ORAL_TABLET | Freq: Four times a day (QID) | ORAL | 0 refills | Status: AC | PRN

## 2019-06-10 MED ORDER — ONDANSETRON HCL 4 MG PO TABS: 4.0000 mg | ORAL_TABLET | Freq: Four times a day (QID) | ORAL | 0 refills | Status: AC | PRN

## 2019-06-10 MED ORDER — AMLODIPINE BESYLATE 5 MG PO TABS: 5.0000 mg | ORAL_TABLET | Freq: Every day | ORAL | 1 refills | Status: DC

## 2019-06-10 MED ORDER — ESCITALOPRAM OXALATE 10 MG PO TABS: 10.0000 mg | ORAL_TABLET | Freq: Every day | ORAL | 1 refills | Status: DC

## 2019-06-10 MED ORDER — HEPARIN SOD (PORK) LOCK FLUSH 100 UNIT/ML IV SOLN
250.0000 [IU] | INTRAVENOUS | Status: AC | PRN
  Administered 2019-06-10: 250 [IU]
  Filled 2019-06-10: qty 2.5

## 2019-06-10 MED ORDER — ASCORBIC ACID 500 MG PO TABS: 500.0000 mg | ORAL_TABLET | Freq: Two times a day (BID) | ORAL | 1 refills | Status: AC

## 2019-06-10 NOTE — Progress Notes (Signed)
PHARMACY - TOTAL PARENTERAL NUTRITION CONSULT NOTE   Indication: Fistula  Patient Measurements: Height: 5' 2"  (157.5 cm) Weight: 134 lb 7.7 oz (61 kg) IBW/kg (Calculated) : 50.1 TPN AdjBW (KG): 54.6 Body mass index is 24.6 kg/m.  Assessment:  31 YOF with hx of partial small bowel resection/EC fistula on chronic TPN since beginning of year.  Significant history of DM with bilateral BKA.  Presenting from SNF with MRSA bacteremia, suspected source from PICC.  Has been on oral diet this admission with line holiday however decreased appetite and may not be absorbing much per RD, unable to meet requirements.  Moderate malnutrition of chronic disease per RD assessment.     Glucose / Insulin: History of DM and requirement of insulin in past TPNs. Previously this admission on D10 to maintain euglycemia before TPN start. CBG controlled on TPN.  6 units of Novolog insulin in last 24 hours. Insulin requirement is not high enough to justify insulin in TPN.  Electrolytes: From 3/29 labs: Na improved at 136, K 4.4, CO2 25, Mag 1.9, Phos 4, and CoCa 11.1.  Renal: SCr up 0.92,  BUN up 68.  LFTs / TGs: AST/ALT/Tbili within normal limits. Alk Phos elevated at 179. TG 86. Prealbumin / albumin: Prealbumin 6>>9.3/ Albumin 1.4 Intake / Output; MIVF: UOP 0.7 mL/kg/hr. Colostomy output 200 mL - on Imodium 34m every 8 hours. JP drain 10 mL. LLQ drain 0 mL.   GI Imaging: none since start of TPN Surgeries / Procedures: none since start of TPN  Central access: PICC replaced 05/29/19 TPN start date: 05/29/19  Nutritional Goals (per RD recommendation on 3/25): kCal: 1800-2000, Protein: 83-110g, Fluid: >= 1.9L Goal TPN rate is 75 mL/hr (provides 110 g of protein and 1805 kcals per day)  Current Nutrition:   Regular diet --intake 0-10% of meals  Cycling TPN for discharge home - current TPN provides 1800 kcal and 110g protein - meeting 100% of estimated needs  Plan:  Continue cyclic TPN over 14 hours - rate of 69  mL/hr x 1 hour, the 138 ml/hr x 12 hrs, then 69 ml/hr x 1 hour then off.  Electrolytes in TPN: 60 mEq/L of Na, 18 mEq/L of K (32 mEq/day), Ca removed 3/29, 6 mEq/L of Mg, and 7.5 mmol/L of Phos. Cl: Acetate 1:2. Patient receiving oral MVI which contains trace - will not add to TPN.  Continue Sensitive q6h SSI and adjust as needed. TPN labs Mon/Thurs.   Monitor po intake and plan for TPN long-term/discharge.  Monitor renal function (trend up) and electrolytes closely.  Thank you for allowing pharmacy to be a part of this patient's care.  EAlycia Rossetti PharmD, BCPS Clinical Pharmacist Clinical phone for 06/10/2019: [phone]/30/2021 8:59 AM   **Pharmacist phone directory can now be found on [email or web address] ([email or web address]).  Listed under [email or web address]

## 2019-06-10 NOTE — Progress Notes (Signed)
Gina [name] to be discharged Home per MD order. Discussed prescriptions and follow up appointments with the patient. Prescriptions given to patient; medication list explained in detail. Patient verbalized understanding.  IV catheter discontinued intact. Site without signs and symptoms of complications. Dressing and pressure applied. Pt denies pain at the site currently. No complaints noted.  Patient D/C'd home w/ Foley, PICC Line, eakin pouch and abdominal drains. Daughter called and notified of patient being en route home via [facility].    An After Visit Summary (AVS) was printed and given to the patient. Patient escorted via stretcher, and discharged home via [place].  Rivers Hamrick Nghus  Karisa Nesser, RN

## 2019-06-10 NOTE — Progress Notes (Signed)
[facility] ([facility])  Hospital Liaison: RN note         Notified by TOC manager of patient/family request for [facility] Palliative services at home after discharge.                  [facility] Palliative team will follow up with patient after discharge.         Please call with any hospice or palliative related questions.         Thank you for this referral.         Mary Anne Robertson, RN, CCM  [facility] Hospital Liaison (listed on [facility] under [facility])    [phone]   

## 2019-06-10 NOTE — Progress Notes (Signed)
Discharge instructions has been discussed with daughter over the phone, she verbalized understanding. Waiting for [facility] to transport pt to her home.

## 2019-06-10 NOTE — Discharge Summary (Signed)
Physician Discharge Summary  Gina [name] [id number]:[id number] DOB: 1961-02-15 DOA: 05/22/2019  PCP: [name], MD  Admit date: 05/22/2019 Discharge date: 06/10/2019  Admitted From: Home Disposition:  Home  Discharge Condition:Stable CODE STATUS: DNR Diet recommendation: regular, TPN  Brief/Interim Summary:  59 year old female with DM, HTN, schizophrenia, bilateral BKA sent from [facility] due to positive blood culture. She was recently admitted  on November 2020 for perforated prepyloric ulcer with septic shock with acute peritonitis status post exlap with [name] patch on 11/23, and was [facility] on Abx and antifungal for total 6 weeks. Patient was discharged with Foley catheter, PICC line, bilateral lower quadrant drains along with enterocutaneous fistula with colostomy bag . She was discharged from [facility] to [facility] whiere she has been living for last 3 months. Patient was sent to ER for vague sleepiness, malaise ,anorexia low-grade fever and leukocytosis at SNF, and in the ER exam labs were  unremarkable and patient was sent back to SNF but overnight blood culture showed MRSA and she was readmitted to the hospital. Patient was being followed by infectious disease, general surgery.She did well with speech, diet was restarted.Foley catheter was exchanged. Repeat blood culture negative from 3/2. ID has advised to continue vancomycin through 3/25 and ordered PICC line and she completed the course.  Patient and family wished  to return home with home health services upon discharge and has declined skilled nursing facility placement.  She has been started back on TPN.  General surgery were  following. General surgery has cleared for discharge.  Patient and daughter want home health.  She needs TPN at home.  She is medically stable for discharge .  She will follow-up with  surgery on 06/25/2019.  Following problems were addressed during her hospitalization:  MSSA  bacteremia: Indwelling PICC line used for TPN was felt to be the likely source, PICC line was removed.  Repeat blood cultures have been negative.  New PICC line placed on 3/18.  Completed vancomycin course on 3/25.  ID was following.  New cultures have been sent, so far negative.  Chest x-ray did not show any pneumonia.Afebrile since last several days  Intra-abdominal fluid collection: Patient with history of exploratory laparotomy/partial small bowel resection for intra-abdominal abscess in November.  She has Eakins pouch and LLQ drain. She was admitted in November 2020 for perforated prepyloric ulcer with septic shock, acute peritonitis.  Status post exploratory laparotomy with [name] patch. She had 2 separate enterocutaneous fistula.  She might need injection studies to further characterize fistulas in the future as well as EGD.  She will follow-up with surgery on 06/25/2019 as an outpatient.  Moderate protein calorie malnutrition: Dietitian following.  Currently on TPN.Also on oral diet.  Currently having loose stools.  On Imodium  Hypertension: Currently blood pressure well controlled.  On amlodipine,lopressor.  Diabetes type 2: Hemoglobin A1c of 5.2 as per  3/12.  Will not put on insulin on DC.  Normocytic anemia: Has been transfused with several units of PRBC during the hospitalization.  Currently hemoglobin stable  GERD: Continue PPI  Mood disorder: Continue Celexa  Goals of care: Patient is DNR.  Palliative care was following.  Recommended to follow-up as an outpatient.   Discharge Diagnoses:  Principal Problem:   MRSA bacteremia Active Problems:   HTN (hypertension)   Diabetes ([place])   Severe protein-calorie malnutrition ([place])   Anemia of chronic disease   Type 2 diabetes mellitus with hyperosmolar nonketotic hyperglycemia (HCC)   Pressure injury  of right thigh, unstageable ([place])   Below-knee amputee ([place])   Atherosclerosis of native arteries of extremities with  gangrene, right leg (HCC)   Intra-abdominal fluid collection   Palliative care by specialist   Goals of care, counseling/discussion   DNR (do not resuscitate)   Physical deconditioning   Dysphagia   Malnutrition of moderate degree    Discharge Instructions  Discharge Instructions    Diet general   Complete by: As directed    Discharge instructions   Complete by: As directed    1)Do a CBC, BMP test in a week. 2)Follow up with Dr. Lucia Gaskins on 06/25/2019 at 9 AM 3)Follow up with home health.   Increase activity slowly   Complete by: As directed      Allergies as of 06/10/2019      Reactions   Bee Venom Swelling   Swells up with bee stings    Metformin And Related Other (See Comments)   Upset stomach      Medication List    STOP taking these medications   insulin aspart 100 UNIT/ML injection Commonly known as: novoLOG   Lantus SoloStar 100 UNIT/ML Solostar Pen Generic drug: insulin glargine     TAKE these medications   amLODipine 5 MG tablet Commonly known as: NORVASC Take 1 tablet (5 mg total) by mouth daily.   ascorbic acid 500 MG tablet Commonly known as: VITAMIN C Take 1 tablet (500 mg total) by mouth 2 (two) times daily.   escitalopram 10 MG tablet Commonly known as: LEXAPRO Take 1 tablet (10 mg total) by mouth daily.   gabapentin 300 MG capsule Commonly known as: NEURONTIN Take 1 capsule (300 mg total) by mouth 2 (two) times daily.   loperamide 2 MG capsule Commonly known as: IMODIUM Take 1 capsule (2 mg total) by mouth every 8 (eight) hours.   melatonin 3 MG Tabs tablet Take 1 tablet (3 mg total) by mouth at bedtime.   metoprolol tartrate 50 MG tablet Commonly known as: LOPRESSOR Take 1 tablet (50 mg total) by mouth 2 (two) times daily.   ondansetron 4 MG tablet Commonly known as: ZOFRAN Take 1 tablet (4 mg total) by mouth every 6 (six) hours as needed for nausea.   oxyCODONE 5 MG immediate release tablet Commonly known as: Oxy  IR/ROXICODONE Take 1 tablet (5 mg total) by mouth every 6 (six) hours as needed (or pain).   pantoprazole 40 MG tablet Commonly known as: PROTONIX Take 1 tablet (40 mg total) by mouth daily.   simethicone 80 MG chewable tablet Commonly known as: MYLICON Chew 80 mg by mouth 4 (four) times daily.   sodium chloride 0.65 % Soln nasal spray Commonly known as: OCEAN Place 1 spray into both nostrils every 4 (four) hours as needed (dry nasal membranes).            Durable Medical Equipment  (From admission, onward)         Start     Ordered   06/09/19 1021  For home use only DME wheelchair cushion (seat and back)  Once    Comments: Needs roho cushion d/t unstageable pressure injury to coccyx area   06/09/19 1021   05/29/19 1354  For home use only DME Hospital bed  Once    Question Answer Comment  Length of Need 6 Months   The above medical condition requires: Patient requires the ability to reposition frequently   Head must be elevated greater than: 30 degrees   Bed  type Semi-electric   [employer] Yes   Support Surface: Gel Overlay      05/29/19 1353   05/29/19 1350  For home use only DME lightweight manual wheelchair with seat cushion  Once    Comments: Patient suffers from bilateral BKA which impairs their ability to perform daily activities like ambulating/ bathing in the home.  A walker will not resolve  issue with performing activities of daily living. A wheelchair will allow patient to safely perform daily activities. Patient is not able to propel themselves in the home using a standard weight wheelchair due to weakness. Patient can self propel in the lightweight wheelchair. Length of need lifetime. Accessories: elevating leg rests (ELRs), wheel locks, extensions and anti-tippers.   05/29/19 1352         Follow-up Information    Alphonsa Overall, MD. Go on 06/25/2019.   Specialty: General Surgery Why: Your appointment is 4/14 at [facility]. Please arrive 30 minutes prior to  your appointment to check in and fill out paperwork. Bring photo ID and insurance information. Contact information: [address] [place] [address] [phone]        [facility] Follow up.   Why: Account [id number] Initial order placed [place] pouches #[id number] Box of 10 (3 boxes) and barrier rings #[id number] Box of 10 (3 boxes) - may take up to 6 days for delivery Contact information: [place] [address] P [phone] F [phone] [email or web address] M-F, 8:00 a.m. to 9:00 p.m. EST Sat, 9:00 a.m. to 3:00 p.m. EST       [facility] Follow up.   Contact information: [address] [place], [place] [address] [phone]         Allergies  Allergen Reactions  . Bee Venom Swelling    Swells up with bee stings   . Metformin And Related Other (See Comments)    Upset stomach    Consultations:  General surgery, ID   Procedures/Studies: CT ABDOMEN PELVIS W CONTRAST  Result Date: 05/21/2019 CLINICAL DATA:  Acute generalized abdominal pain. EXAM: CT ABDOMEN AND PELVIS WITH CONTRAST TECHNIQUE: Multidetector CT imaging of the abdomen and pelvis was performed using the standard protocol following bolus administration of intravenous contrast. CONTRAST:  165mL OMNIPAQUE IOHEXOL 300 MG/ML  SOLN COMPARISON:  April 11, 2019. FINDINGS: Lower chest: Mild right pleural effusion is noted with associated atelectasis of the right lower lobe. Mild left basilar subsegmental atelectasis is noted. Hepatobiliary: Solitary gallstone is noted. No biliary dilatation is noted. The liver is unremarkable. Pancreas: Unremarkable. No pancreatic ductal dilatation or surrounding inflammatory changes. Spleen: Normal in size without focal abnormality. Adrenals/Urinary Tract: Adrenal glands and kidneys appear normal. No hydronephrosis or renal obstruction is noted. Urinary bladder is decompressed secondary to Foley catheter.  Stomach/Bowel: The stomach is unremarkable. There is no evidence of bowel obstruction or inflammation. The appendix is unremarkable. Vascular/Lymphatic: No significant vascular findings are present. No enlarged abdominal or pelvic lymph nodes. Reproductive: Uterus and bilateral adnexa are unremarkable. Other: Surgical drain is seen passing underneath the liver with distal tip inferior to the left hepatic lobe. Continued presence of pigtail catheter within complex fluid collection anteriorly in the abdomen concerning for abscess. It is significantly smaller compared to prior exam, currently measuring 7.9 x 1.5 cm. Musculoskeletal: No acute or significant osseous findings. IMPRESSION: 1. Mild right pleural effusion is noted with associated atelectasis of the right lower lobe. 2. Solitary gallstone is noted. 3. Surgical drain is seen passing  underneath the liver with distal tip inferior to the left hepatic lobe. Continued presence of complex fluid collection anteriorly in the abdomen concerning for abscess. It is significantly smaller compared to prior exam, currently measuring 7.9 x 1.5 cm. Pigtail drainage catheter is unchanged in position. Electronically Signed   By: Marijo Conception M.D.   On: 05/21/2019 13:36   DG Chest Port 1 View  Result Date: 06/01/2019 CLINICAL DATA:  Fever evaluation. EXAM: PORTABLE CHEST 1 VIEW COMPARISON:  Chest radiograph 05/21/2019 FINDINGS: A right-sided PICC is present with tip projecting in the region of the upper right atrium. Shallow inspiration radiograph with accentuation of the cardiac silhouette. Probable mild cardiomegaly, unchanged. Similar appearance of bibasilar opacities. Persistent small right pleural effusion. No evidence of pneumothorax. No acute bony abnormality IMPRESSION: Similar appearance of bibasilar opacities (greater on the right) which may reflect atelectasis or pneumonia. Persistent small right pleural effusion. Electronically Signed   By: Kellie Simmering DO    On: 06/01/2019 09:11   DG Chest Port 1 View  Result Date: 05/21/2019 CLINICAL DATA:  Weakness, altered mental status EXAM: PORTABLE CHEST 1 VIEW COMPARISON:  03/26/2019 FINDINGS: Left PICC line in place with the tip in the SVC. Low lung volumes. Bibasilar airspace opacities, right greater than left. Small right pleural effusion. Heart is mildly enlarged. No acute bony abnormality. IMPRESSION: Bibasilar atelectasis or infiltrates, right greater than left. Small right pleural effusion. Electronically Signed   By: Rolm Baptise M.D.   On: 05/21/2019 10:45   DG Swallowing Func-Speech Pathology  Result Date: 05/24/2019 Objective Swallowing Evaluation: Type of Study: MBS-Modified Barium Swallow Study  Patient Details Name: Alinna Siple MRN: [id number] Date of Birth: 1960-12-20 Today's Date: 05/24/2019 Time: SLP Start Time (ACUTE ONLY): 1035 -SLP Stop Time (ACUTE ONLY): 1043 SLP Time Calculation (min) (ACUTE ONLY): 8 min Past Medical History: Past Medical History: Diagnosis Date . Diabetes mellitus  . Hypertension  . Osteomyelitis of right foot ([place]) 02/22/2017 . Perforated gastric ulcer ([place])  . S/P bilateral BKA (below knee amputation) ([place])  . Schizophrenia ([place])  . Schizophrenia ([place])  . Septic arthritis of interphalangeal joint of toe of left foot ([place]) 06/02/2016 . Stress incontinence 04/26/2017 Past Surgical History: Past Surgical History: Procedure Laterality Date . AMPUTATION Left 06/05/2016  Procedure: LEFT FOOT TRANSMETATARSAL AMPUTATION;  Surgeon: [name], MD;  Location: [facility];  Service: Orthopedics;  Laterality: Left; . AMPUTATION Right 11/11/2017  Procedure: AMPUTATION BELOW KNEE;  Surgeon: [name], MD;  Location: [facility];  Service: Orthopedics;  Laterality: Right; . AMPUTATION Left 05/10/2018  Procedure: LEFT BELOW KNEE AMPUTATION;  Surgeon: [name], MD;  Location: [facility];  Service: Orthopedics;  Laterality: Left; . AMPUTATION Left 12/28/2018  Procedure: AMPUTATION THIRD DIGIT;  Surgeon:  Iran Planas, MD;  Location: [facility];  Service: Orthopedics;  Laterality: Left; . AMPUTATION TOE Right 02/23/2017  Procedure: AMPUTATION RIGHT THIRD TOE;  Surgeon: [name], MD;  Location: [facility];  Service: Orthopedics;  Laterality: Right; . I & D EXTREMITY Right 11/09/2017  Procedure: Debride Ulcer Right Heel;  Surgeon: [name], MD;  Location: [facility];  Service: Orthopedics;  Laterality: Right; . I & D EXTREMITY Left 12/28/2018  Procedure: IRRIGATION AND DEBRIDEMENT EXTREMITY;  Surgeon: Iran Planas, MD;  [facility]: [facility];  Service: Orthopedics;  Laterality: Left; . INCISION AND DRAINAGE OF WOUND Left 12/26/2018  Procedure: IRRIGATION AND DEBRIDEMENT LEFT HAND;  Surgeon: Iran Planas, MD;  [facility];  Service: Orthopedics;  Laterality:  Left; . LAPAROTOMY N/A 02/02/2019  Procedure: EXPLORATORY LAPAROTOMY WITH OVERSEW OF DUODENAL ULCER;  Surgeon: Alphonsa Overall, MD;  [facility];  Service: General;  Laterality: N/A; . TUBAL LIGATION   HPI: Gina [name] is a 59 y.o. F with hx DM, HTN, bilateral BKA, schizophrenia, and recent perforated ulcer leading to septic shock, DKA, fungemia, and persistent peritoneal abscess who presents with malaise, found to have MRSA bacteremia. Patient admitted in Nov 2020 for septic shock, found to have acute peritonitis. [facility], and anidulafungin for total 6 weeks. Based on chart review, patient was seen for dysphagia and participated in [facility] 03/2019 while at [facility]. However, full notes not available. She was then discharged from [facility] to [facility], where she has been for last three months.  Pt now admitted with MRSA. CXR 3/10: Bibasilar atelectasis or infiltrates, right greater than left. Small right pleural effusion.  No data recorded Assessment / Plan / Recommendation CHL IP CLINICAL IMPRESSIONS 05/24/2019 Clinical Impression Patient presensts with a largely normal, age appropriate, oropharyngeal  swallow. Oral phase timely with swift oral transit of bolus. Swallow triggered mostly at the vallecula with one episode of frank penetration of thin liquids which remained under the epiglottis post swallow however cleared spontaneously with subsequent swallows before reaching the level of the vocal cords. No additional episodes of penetration or aspiration despite being challenged with multiple consecutive straw sips of liquid. Note that patient unable to orally  transit pill with thin liquids, able with pureed solids, however no trouble reported at bedside with pills by patient or RN. Recommend initiation of regular texture solids, thin liquid (per MD, "diet as tolerated"). SLP will f/u briefly for tolerance.  SLP Visit Diagnosis Dysphagia, unspecified (R13.10) Attention and concentration deficit following -- Frontal lobe and executive function deficit following -- Impact on safety and function No limitations   CHL IP TREATMENT RECOMMENDATION 05/24/2019 Treatment Recommendations Therapy as outlined in treatment plan below   Prognosis 05/23/2019 Prognosis for Safe Diet Advancement Fair Barriers to Reach Goals Severity of deficits;Time post onset Barriers/Prognosis Comment -- CHL IP DIET RECOMMENDATION 05/24/2019 SLP Diet Recommendations Regular solids;Thin liquid Liquid Administration via Cup;Straw Medication Administration Whole meds with liquid Compensations Slow rate;Small sips/bites Postural Changes Seated upright at 90 degrees   CHL IP OTHER RECOMMENDATIONS 05/24/2019 Recommended Consults -- Oral Care Recommendations Oral care BID Other Recommendations --   CHL IP FOLLOW UP RECOMMENDATIONS 05/24/2019 Follow up Recommendations None   CHL IP FREQUENCY AND DURATION 05/24/2019 Speech Therapy Frequency (ACUTE ONLY) min 1 x/week Treatment Duration 1 week      CHL IP ORAL PHASE 05/24/2019 Oral Phase WFL Oral - Pudding Teaspoon -- Oral - Pudding Cup -- Oral - Honey Teaspoon -- Oral - Honey Cup -- Oral - Nectar Teaspoon --  Oral - Nectar Cup -- Oral - Nectar Straw -- Oral - Thin Teaspoon -- Oral - Thin Cup -- Oral - Thin Straw -- Oral - Puree -- Oral - Mech Soft -- Oral - Regular -- Oral - Multi-Consistency -- Oral - Pill -- Oral Phase - Comment --  CHL IP PHARYNGEAL PHASE 05/24/2019 Pharyngeal Phase WFL Pharyngeal- Pudding Teaspoon -- Pharyngeal -- Pharyngeal- Pudding Cup -- Pharyngeal -- Pharyngeal- Honey Teaspoon -- Pharyngeal -- Pharyngeal- Honey Cup -- Pharyngeal -- Pharyngeal- Nectar Teaspoon -- Pharyngeal -- Pharyngeal- Nectar Cup -- Pharyngeal -- Pharyngeal- Nectar Straw -- Pharyngeal -- Pharyngeal- Thin Teaspoon -- Pharyngeal -- Pharyngeal- Thin Cup -- Pharyngeal -- Pharyngeal- Thin  Straw -- Pharyngeal -- Pharyngeal- Puree -- Pharyngeal -- Pharyngeal- Mechanical Soft -- Pharyngeal -- Pharyngeal- Regular -- Pharyngeal -- Pharyngeal- Multi-consistency -- Pharyngeal -- Pharyngeal- Pill -- Pharyngeal -- Pharyngeal Comment --  CHL IP CERVICAL ESOPHAGEAL PHASE 05/24/2019 Cervical Esophageal Phase WFL Pudding Teaspoon -- Pudding Cup -- Honey Teaspoon -- Honey Cup -- Nectar Teaspoon -- Nectar Cup -- Nectar Straw -- Thin Teaspoon -- Thin Cup -- Thin Straw -- Puree -- Mechanical Soft -- Regular -- Multi-consistency -- Pill -- Cervical Esophageal Comment -- Gabriel Rainwater MA, CCC-SLP McCoy Leah Meryl 05/24/2019, 11:11 AM              ECHOCARDIOGRAM COMPLETE  Result Date: 05/23/2019    ECHOCARDIOGRAM REPORT   Patient Name:   Gina [name] Date of Exam: 05/23/2019 Medical Rec #:  [id number]       Height:       62.0 in Accession #:    [id number]      Weight:       120.4 lb Date of Birth:  [birth year]        BSA:          1.541 m Patient Age:    58 years        BP:           128/61 mmHg Patient Gender: F               HR:           76 bpm. Exam Location:  Inpatient Procedure: 2D Echo Indications:    bacteremia 790.7  History:        Patient has prior history of Echocardiogram examinations, most                 recent 03/04/2019.  Signs/Symptoms:Bacteremia; Risk                 Factors:Diabetes and Hypertension.  Sonographer:    Johny Chess Referring Phys: [id number] CHRISTOPHER P DANFORD IMPRESSIONS  1. Left ventricular ejection fraction, by estimation, is 55 to 60%. The left ventricle has normal function. The left ventricle has no regional wall motion abnormalities. Left ventricular diastolic parameters were normal.  2. Right ventricular systolic function is normal. The right ventricular size is normal.  3. The mitral valve is normal in structure. No evidence of mitral valve regurgitation. No evidence of mitral stenosis.  4. The aortic valve is normal in structure. Aortic valve regurgitation is not visualized. No aortic stenosis is present. FINDINGS  Left Ventricle: Left ventricular ejection fraction, by estimation, is 55 to 60%. The left ventricle has normal function. The left ventricle has no regional wall motion abnormalities. The left ventricular internal cavity size was normal in size. There is  no left ventricular hypertrophy. Left ventricular diastolic parameters were normal. Right Ventricle: The right ventricular size is normal. No increase in right ventricular wall thickness. Right ventricular systolic function is normal. Left Atrium: Left atrial size was normal in size. Right Atrium: Right atrial size was normal in size. Pericardium: There is no evidence of pericardial effusion. Mitral Valve: The mitral valve is normal in structure. No evidence of mitral valve regurgitation. No evidence of mitral valve stenosis. Tricuspid Valve: The tricuspid valve is normal in structure. Tricuspid valve regurgitation is trivial. Aortic Valve: The aortic valve is normal in structure. Aortic valve regurgitation is not visualized. No aortic stenosis is present. Pulmonic Valve: The pulmonic valve was normal in structure. Pulmonic valve regurgitation is not visualized. Aorta: The aortic root  and ascending aorta are structurally normal, with no  evidence of dilitation. IAS/Shunts: The atrial septum is grossly normal.  LEFT VENTRICLE PLAX 2D LVIDd:         4.30 cm  Diastology LVIDs:         3.10 cm  LV e' lateral:   8.81 cm/s LV PW:         1.00 cm  LV E/e' lateral: 12.3 LV IVS:        0.80 cm  LV e' medial:    6.20 cm/s LVOT diam:     1.70 cm  LV E/e' medial:  17.4 LV SV:         57 LV SV Index:   37 LVOT Area:     2.27 cm  RIGHT VENTRICLE RV S prime:     12.00 cm/s TAPSE (M-mode): 1.8 cm LEFT ATRIUM             Index       RIGHT ATRIUM           Index LA diam:        2.80 cm 1.82 cm/m  RA Area:     10.80 cm LA Vol (A2C):   34.3 ml 22.26 ml/m RA Volume:   21.90 ml  14.21 ml/m LA Vol (A4C):   34.5 ml 22.39 ml/m LA Biplane Vol: 36.3 ml 23.56 ml/m  AORTIC VALVE LVOT Vmax:   139.00 cm/s LVOT Vmean:  90.400 cm/s LVOT VTI:    0.251 m  AORTA Ao Root diam: 2.60 cm MITRAL VALVE MV Area (PHT): 2.62 cm     SHUNTS MV Decel Time: 289 msec     Systemic VTI:  0.25 m MV E velocity: 108.00 cm/s  Systemic Diam: 1.70 cm MV A velocity: 102.00 cm/s MV E/A ratio:  1.06 Mertie Moores MD Electronically signed by Mertie Moores MD Signature Date/Time: 05/23/2019/11:26:04 AM    Final    ECHO TEE  Result Date: 05/27/2019    TRANSESOPHOGEAL ECHO REPORT   Patient Name:   Gina [name] Date of Exam: 05/27/2019 Medical Rec #:  [id number]       Height:       62.0 in Accession #:    [id number]      Weight:       120.4 lb Date of Birth:  October 22, 1960        BSA:          1.541 m Patient Age:    46 years        BP:           90/41 mmHg Patient Gender: F               HR:           62 bpm. Exam Location:  Inpatient Procedure: Transesophageal Echo, Cardiac Doppler and Color Doppler Indications:     Bacteremia  History:         Patient has prior history of Echocardiogram examinations, most                  recent 05/23/2019. Signs/Symptoms:Fever; Risk Factors:Diabetes                  and Hypertension. MRSA                  Septic shock.  Sonographer:     Vikki Ports Turrentine Referring  Phys:  [id number] Abigail Butts Diagnosing Phys: Skeet Latch MD PROCEDURE: The transesophogeal probe was  passed without difficulty through the esophogus of the patient. Sedation performed by different physician. The patient's vital signs; including heart rate, blood pressure, and oxygen saturation; remained stable throughout the procedure. The patient developed no complications during the procedure. IMPRESSIONS  1. Left ventricular ejection fraction, by estimation, is 55 to 60%. The left ventricle has normal function. The left ventricle has no regional wall motion abnormalities. Left ventricular diastolic function could not be evaluated.  2. Right ventricular systolic function is normal. The right ventricular size is normal.  3. No left atrial/left atrial appendage thrombus was detected.  4. The mitral valve is normal in structure. Mild mitral valve regurgitation. No evidence of mitral stenosis.  5. The aortic valve is tricuspid. Aortic valve regurgitation is not visualized. No aortic stenosis is present. Conclusion(s)/Recommendation(s): Normal biventricular function without evidence of hemodynamically significant valvular heart disease. FINDINGS  Left Ventricle: Left ventricular ejection fraction, by estimation, is 55 to 60%. The left ventricle has normal function. The left ventricle has no regional wall motion abnormalities. The left ventricular internal cavity size was normal in size. There is  no left ventricular hypertrophy. Left ventricular diastolic function could not be evaluated. Right Ventricle: The right ventricular size is normal. No increase in right ventricular wall thickness. Right ventricular systolic function is normal. Left Atrium: Left atrial size was normal in size. No left atrial/left atrial appendage thrombus was detected. Right Atrium: Right atrial size was normal in size. Pericardium: Trivial pericardial effusion is present. Mitral Valve: The mitral valve is normal in structure. Normal  mobility of the mitral valve leaflets. Mild mitral valve regurgitation. No evidence of mitral valve stenosis. Tricuspid Valve: The tricuspid valve is normal in structure. Tricuspid valve regurgitation is mild . No evidence of tricuspid stenosis. Aortic Valve: The aortic valve is tricuspid. Aortic valve regurgitation is not visualized. No aortic stenosis is present. Pulmonic Valve: The pulmonic valve was normal in structure. Pulmonic valve regurgitation is trivial. No evidence of pulmonic stenosis. Aorta: The aortic root is normal in size and structure. IAS/Shunts: No atrial level shunt detected by color flow Doppler.  TRICUSPID VALVE TR Peak grad:   49.0 mmHg TR Vmax:        350.00 cm/s Skeet Latch MD Electronically signed by Skeet Latch MD Signature Date/Time: 05/27/2019/5:12:39 PM    Final    [place] EKG SITE RITE  Result Date: 05/28/2019 If Site Rite image not attached, placement could not be confirmed due to current cardiac rhythm.  DG UGI W SMALL BOWEL  Result Date: 05/23/2019 CLINICAL DATA:  Bowel leak. EXAM: UPPER GI SERIES WITH KUB TECHNIQUE: After obtaining a scout radiograph a routine upper GI series was performed using water-soluble contrast only FLUOROSCOPY TIME:  Fluoroscopy Time:  4 minutes 36 seconds Radiation Exposure Index (if provided by the fluoroscopic device): 1.2 mGy Number of Acquired Spot Images: 4 COMPARISON:  CT study of May 21, 2019 and prior study from April 11, 2019 FINDINGS: Initial scout images show a surgical drain in the right upper quadrant and a pigtail drainage catheter near the midline. Study was performed only with water-soluble contrast. During the examination which was performed in the bilateral oblique and right lateral position the esophagus showed some evidence of tertiary peristalsis and a patulous appearance with moderate gastroesophageal reflux. Question of mild distal esophageal narrowing not well assessed. Stomach was grossly normal. Patient was placed  in the right lateral position and contrast passed from the stomach into the small bowel. When contrast reached the level of the  proximal jejunum a tract extended anteriorly towards the anterior abdominal wall and more distal small bowel loops began to fill, at least at 2 additional locations. Gas can be seen surrounding the drainage catheter. Paragraphs shortly after the tract extending from the jejunum is filled with contrast bowel loops to the right of the midline also began to fill and bowel loops inferior to the central abdominal collection. The at least 2 sites of communication or possible based on this study and based on review of previous imaging contrast did not fill the drain, likely passing via route of least resistance into the bowel beyond. 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 Difficult to map given the nature the process in communication with proximal small bowel where the first site of communication likely involves the proximal jejunum. Follow-up CT could be performed with positive enteric contrast. Alternatively, waiting for clearance of current contrast followed by a CT after drain injection could be helpful to further map this complex process as clinically warranted. Injection of the drain followed by CT would give the added advantage of three-dimensional relationships and knowledge of what is in definitive communication with the collection. Electronically Signed   By: Zetta Bills M.D.   On: 05/23/2019 11:40      Subjective: Patient seen and examined at the bedside this morning.  Hemodynamically stable for discharge today.  Discharge Exam: Vitals:   06/10/19 0534 06/10/19 0912  BP: 122/62 129/63  Pulse: 74 72  Resp: 16 16  Temp: 98.2 F (36.8 C) 98.1 F (36.7 C)  SpO2: 93% 97%   Vitals:    06/09/19 1703 06/09/19 2222 06/10/19 0534 06/10/19 0912  BP: 122/62 127/67 122/62 129/63  Pulse: 72 73 74 72  Resp: 18 16 16 16   Temp: 98.7 F (37.1 C) 98.8 F (37.1 C) 98.2 F (36.8 C) 98.1 F (36.7 C)  TempSrc: Oral Oral Oral Oral  SpO2: 96% 93% 93% 97%  Weight:      Height:        General: Pt is alert, awake, not in acute distress, malnourished, cachectic Cardiovascular: RRR, S1/S2 +, no rubs, no gallops Respiratory: CTA bilaterally, no wheezing, no rhonchi Abdominal: Soft, NT, ND, bowel sounds +,Eakin's pouch,abdominal drain 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     Microbiology: Recent Results (from the past 240 hour(s))  Culture, blood (routine x 2)     Status: None   Collection Time: 06/01/19  9:13 AM   Specimen: BLOOD RIGHT HAND  Result Value Ref Range Status   Specimen Description BLOOD RIGHT HAND  Final   Special Requests   Final    BOTTLES DRAWN AEROBIC ONLY Blood Culture adequate volume   Culture   Final    NO GROWTH 5 DAYS Performed at [facility], [address], [place], [place] [address]    Report Status 06/06/2019 FINAL  Final  Culture, blood (routine x 2)     Status: None   Collection Time: 06/01/19  9:13 AM   Specimen: BLOOD  Result Value Ref Range Status   Specimen Description BLOOD LEFT ANTECUBITAL  Final   Special Requests   Final    BOTTLES DRAWN AEROBIC ONLY Blood Culture results may not be optimal  due to an inadequate volume of blood received in culture bottles   Culture   Final    NO GROWTH 5 DAYS Performed at [facility], [place] [address], [place], [place] [address]    Report Status 06/06/2019 FINAL  Final     Labs: BNP (last 3 results) No results for input(s): BNP in the last 8760 hours. Basic Metabolic Panel: Recent Labs  Lab 06/05/19 0323 06/07/19 0420  06/09/19 0406  NA 131* 133* 136  K 4.9 4.6 4.4  CL 103 104 106  CO2 23 23 25   GLUCOSE 186* 142* 108*  BUN 62* 60* 68*  CREATININE 0.87 0.78 0.92  CALCIUM 8.6* 9.0 9.0  MG 2.0 1.8 1.9  PHOS 4.5 3.5 4.0   Liver Function Tests: Recent Labs  Lab 06/05/19 0323 06/09/19 0406  AST 17 25  ALT 16 26  ALKPHOS 150* 179*  BILITOT 0.5 0.9  PROT 6.5 6.4*  ALBUMIN 1.4* 1.4*   No results for input(s): LIPASE, AMYLASE in the last 168 hours. No results for input(s): AMMONIA in the last 168 hours. CBC: Recent Labs  Lab 06/08/19 0347 06/09/19 0406  WBC 7.8 7.2  NEUTROABS 5.1 4.5  HGB 7.8* 7.5*  HCT 25.1* 24.2*  MCV 93.7 93.4  PLT 309 319   Cardiac Enzymes: No results for input(s): CKTOTAL, CKMB, CKMBINDEX, TROPONINI in the last 168 hours. BNP: Invalid input(s): POCBNP CBG: Recent Labs  Lab 06/09/19 1149 06/09/19 1805 06/10/19 0027 06/10/19 0538 06/10/19 1133  GLUCAP 123* 90 169* 162* 95   D-Dimer No results for input(s): DDIMER in the last 72 hours. Hgb A1c No results for input(s): HGBA1C in the last 72 hours. Lipid Profile Recent Labs    06/09/19 0406  TRIG 86   Thyroid function studies No results for input(s): TSH, T4TOTAL, T3FREE, THYROIDAB in the last 72 hours.  Invalid input(s): FREET3 Anemia work up No results for input(s): VITAMINB12, FOLATE, FERRITIN, TIBC, IRON, RETICCTPCT in the last 72 hours. Urinalysis    Component Value Date/Time   COLORURINE AMBER (A) 05/21/2019 1122   APPEARANCEUR CLOUDY (A) 05/21/2019 1122   LABSPEC 1.017 05/21/2019 1122   PHURINE 6.0 05/21/2019 1122   GLUCOSEU NEGATIVE 05/21/2019 1122   HGBUR LARGE (A) 05/21/2019 1122   BILIRUBINUR NEGATIVE 05/21/2019 1122   BILIRUBINUR N 04/28/2016 1603   KETONESUR NEGATIVE 05/21/2019 1122   PROTEINUR >=300 (A) 05/21/2019 1122   UROBILINOGEN 0.2 04/28/2016 1603   UROBILINOGEN 0.2 06/18/2013 0650   NITRITE NEGATIVE 05/21/2019 1122   LEUKOCYTESUR LARGE (A) 05/21/2019 1122   Sepsis  Labs Invalid input(s): PROCALCITONIN,  WBC,  LACTICIDVEN Microbiology Recent Results (from the past 240 hour(s))  Culture, blood (routine x 2)     Status: None   Collection Time: 06/01/19  9:13 AM   Specimen: BLOOD RIGHT HAND  Result Value Ref Range Status   Specimen Description BLOOD RIGHT HAND  Final   Special Requests   Final    BOTTLES DRAWN AEROBIC ONLY Blood Culture adequate volume   Culture   Final    NO GROWTH 5 DAYS Performed at [facility], [place] [address] [place], [place] [address]    Report Status 06/06/2019 FINAL  Final  Culture, blood (routine x 2)     Status: None   Collection Time: 06/01/19  9:13 AM   Specimen: BLOOD  Result Value Ref Range Status   Specimen Description BLOOD LEFT ANTECUBITAL  Final   Special Requests   Final  BOTTLES DRAWN AEROBIC ONLY Blood Culture results may not be optimal due to an inadequate volume of blood received in culture bottles   Culture   Final    NO GROWTH 5 DAYS Performed at [facility], [place] [address], [place], [place] [address]    Report Status 06/06/2019 FINAL  Final    Please note: You were cared for by a hospitalist during your hospital stay. Once you are discharged, your primary care physician will handle any further medical issues. Please note that NO REFILLS for any discharge medications will be authorized once you are discharged, as it is imperative that you return to your primary care physician (or establish a relationship with a primary care physician if you do not have one) for your post hospital discharge needs so that they can reassess your need for medications and monitor your lab values.    Time coordinating discharge: 40 minutes  SIGNED:   Shelly Coss, MD  [facility] 06/10/2019, 11:45 AM Pager [phone]  If 7PM-7AM, please contact night-coverage [email or web address] Password [email or web address]

## 2019-06-10 NOTE — TOC Transition Note (Signed)
Transition of Care [facility]) - CM/SW Discharge Note   Patient Details  Name: Gina [name] MRN: [id number] Date of Birth: September 28, 1960  [facility] CM/SW Contact:  Bartholomew Crews, RN Phone Number: [phone] 06/10/2019, 4:37 PM   Clinical Narrative:    Patient to transition home today. Arrangements made for infusion nurse to meet patient in the home this evening. Daughter provided phone number for [employer] to schedule delivery of hoyer lift and wheelchair. Plans to exchange hospital bed in a couple days once shipment arrives. Spoke with [place] acceptance pending with [facility] - If [facility] declines, request will go to [facility]. Spoke with representative at [facility], order updated for 2 boxes d/t previous order was over max limit. Order to ship today and should be received by patient the end of week. Daughter aware. [facility] arranged for transport. No further TOC needs identified at this time.    Final next level of care: [place] Barriers to Discharge: No Barriers Identified   Patient Goals and CMS Choice Patient states their goals for this hospitalization and ongoing recovery are:: Patient wants to discharge home once medically ready. Daughter not sure at this point what the best d/c plan is. CMS Medicare.gov Compare Post Acute Care list provided to:: Patient Represenative (must comment) Choice offered to / list presented to : Adult Children  Discharge Placement                  Name of family member notified: [name] Patient and family notified of of transfer: 06/10/19  Discharge Plan and Services                DME Arranged: Hospital bed, Wheelchair manual, Other see comment(hoyer lift)   Date DME Agency Contacted: 06/10/19 Time DME Agency Contacted: 21 Representative spoke with at DME Agency: Thedore Mins HH Arranged: RN, TPN, PCS/Personal Care Services [facility] Agency: [employer] Date [place]: 06/10/19 Time [place]: 1400 Representative spoke with at [place]: [place] ([place]) Interventions     Readmission Risk Interventions Readmission Risk Prevention Plan 02/10/2019 11/07/2017  Post Dischage Appt - Patient refused  Medication Screening - Complete  Transportation Screening Complete Complete  PCP follow-up - Patient refused  PCP or Specialist Appt within 3-5 Days Not Complete -  Not Complete comments DC date unknown- pt established with PCP -  [place] or [place] Complete -  Social Work Consult for [place] Planning/Counseling Not Complete -  SW consult not completed comments awaiting call back from daughter and APS -  Palliative Care Screening Not Complete -  Palliative Care Screening Not Complete Comments pending need -  Medication Review (RN Care Manager) Referral to Pharmacy -  Some recent data might be hidden

## 2019-06-10 NOTE — Plan of Care (Signed)
  Problem: Education: Goal: Knowledge of General Education information will improve Description Including pain rating scale, medication(s)/side effects and non-pharmacologic comfort measures Outcome: Progressing   

## 2019-06-11 ENCOUNTER — Telehealth: Payer: Self-pay | Admitting: Internal Medicine

## 2019-06-11 NOTE — Telephone Encounter (Signed)
Spoke with patients dtr-- Marcheta Grammes  regarding scheduling [facility] Palliative visit. She said they have a lot going on with her just coming home from the hospital and asked that we call them back next week.

## 2019-06-18 ENCOUNTER — Encounter (HOSPITAL_BASED_OUTPATIENT_CLINIC_OR_DEPARTMENT_OTHER): Payer: Medicare Other | Admitting: Physician Assistant

## 2019-06-18 ENCOUNTER — Other Ambulatory Visit: Payer: Self-pay

## 2019-06-18 DIAGNOSIS — E440 Moderate protein-calorie malnutrition: Secondary | ICD-10-CM | POA: Insufficient documentation

## 2019-06-18 DIAGNOSIS — L89893 Pressure ulcer of other site, stage 3: Secondary | ICD-10-CM | POA: Insufficient documentation

## 2019-06-18 DIAGNOSIS — L89892 Pressure ulcer of other site, stage 2: Secondary | ICD-10-CM | POA: Diagnosis not present

## 2019-06-18 DIAGNOSIS — Z89511 Acquired absence of right leg below knee: Secondary | ICD-10-CM | POA: Insufficient documentation

## 2019-06-18 DIAGNOSIS — Z89512 Acquired absence of left leg below knee: Secondary | ICD-10-CM | POA: Insufficient documentation

## 2019-06-18 DIAGNOSIS — I10 Essential (primary) hypertension: Secondary | ICD-10-CM | POA: Insufficient documentation

## 2019-06-18 DIAGNOSIS — L89153 Pressure ulcer of sacral region, stage 3: Secondary | ICD-10-CM | POA: Insufficient documentation

## 2019-06-19 ENCOUNTER — Telehealth: Payer: Self-pay | Admitting: Internal Medicine

## 2019-06-19 NOTE — Telephone Encounter (Signed)
Spoke with patient's daughter [name], regarding Palliative services and all questions were answered and she was in agreement with this.  I have scheduled a Telephone Consult for 06/20/19 @ 10 AM.

## 2019-06-20 ENCOUNTER — Other Ambulatory Visit: Payer: Self-pay

## 2019-06-20 ENCOUNTER — Other Ambulatory Visit: Payer: Medicare Other | Admitting: Internal Medicine

## 2019-06-20 DIAGNOSIS — Z515 Encounter for palliative care: Secondary | ICD-10-CM

## 2019-06-20 DIAGNOSIS — E43 Unspecified severe protein-calorie malnutrition: Secondary | ICD-10-CM

## 2019-06-23 ENCOUNTER — Encounter (HOSPITAL_COMMUNITY): Payer: Self-pay | Admitting: Emergency Medicine

## 2019-06-23 ENCOUNTER — Emergency Department (HOSPITAL_COMMUNITY)
Admission: EM | Admit: 2019-06-23 | Discharge: 2019-06-24 | Disposition: A | Discharge status: Home / Self Care | Payer: Medicare Other | Attending: Emergency Medicine | Admitting: Emergency Medicine

## 2019-06-23 DIAGNOSIS — Z89512 Acquired absence of left leg below knee: Secondary | ICD-10-CM | POA: Insufficient documentation

## 2019-06-23 DIAGNOSIS — X58XXXA Exposure to other specified factors, initial encounter: Secondary | ICD-10-CM | POA: Diagnosis not present

## 2019-06-23 DIAGNOSIS — E119 Type 2 diabetes mellitus without complications: Secondary | ICD-10-CM | POA: Insufficient documentation

## 2019-06-23 DIAGNOSIS — Z79899 Other long term (current) drug therapy: Secondary | ICD-10-CM | POA: Insufficient documentation

## 2019-06-23 DIAGNOSIS — T839XXA Unspecified complication of genitourinary prosthetic device, implant and graft, initial encounter: Secondary | ICD-10-CM | POA: Diagnosis not present

## 2019-06-23 DIAGNOSIS — T83038A Leakage of other indwelling urethral catheter, initial encounter: Secondary | ICD-10-CM | POA: Diagnosis present

## 2019-06-23 DIAGNOSIS — I10 Essential (primary) hypertension: Secondary | ICD-10-CM | POA: Diagnosis not present

## 2019-06-23 DIAGNOSIS — Z89511 Acquired absence of right leg below knee: Secondary | ICD-10-CM | POA: Insufficient documentation

## 2019-06-23 LAB — BASIC METABOLIC PANEL
Anion gap: 7 (ref 5–15)
BUN: 49 mg/dL — ABNORMAL HIGH (ref 6–20)
CO2: 28 mmol/L (ref 22–32)
Calcium: 8.3 mg/dL — ABNORMAL LOW (ref 8.9–10.3)
Chloride: 99 mmol/L (ref 98–111)
Creatinine, Ser: 0.67 mg/dL (ref 0.44–1.00)
GFR calc Af Amer: >60 mL/min (ref >60)
GFR calc non Af Amer: >60 mL/min (ref >60)
Glucose, Bld: 109 mg/dL — ABNORMAL HIGH (ref 70–99)
Potassium: 4.1 mmol/L (ref 3.5–5.1)
Sodium: 134 mmol/L — ABNORMAL LOW (ref 135–145)

## 2019-06-23 LAB — URINALYSIS, ROUTINE W REFLEX MICROSCOPIC
Bilirubin Urine: NEGATIVE
Glucose, UA: NEGATIVE mg/dL
Ketones, ur: NEGATIVE mg/dL
Nitrite: NEGATIVE
Protein, ur: 100 mg/dL — AB
Specific Gravity, Urine: 1.013 (ref 1.005–1.030)
WBC, UA: >50 WBC/hpf — ABNORMAL HIGH (ref 0–5)
pH: 8 (ref 5.0–8.0)

## 2019-06-23 LAB — CBC WITH DIFFERENTIAL/PLATELET
Abs Immature Granulocytes: 0.02 10*3/uL (ref 0.00–0.07)
Basophils Absolute: 0 10*3/uL (ref 0.0–0.1)
Basophils Relative: 0 %
Eosinophils Absolute: 0.1 10*3/uL (ref 0.0–0.5)
Eosinophils Relative: 2 %
HCT: 23.8 % — ABNORMAL LOW (ref 36.0–46.0)
Hemoglobin: 7.5 g/dL — ABNORMAL LOW (ref 12.0–15.0)
Immature Granulocytes: 0 %
Lymphocytes Relative: 32 %
Lymphs Abs: 2.5 10*3/uL (ref 0.7–4.0)
MCH: 29.3 pg (ref 26.0–34.0)
MCHC: 31.5 g/dL (ref 30.0–36.0)
MCV: 93 fL (ref 80.0–100.0)
Monocytes Absolute: 1 10*3/uL (ref 0.1–1.0)
Monocytes Relative: 13 %
Neutro Abs: 4.2 10*3/uL (ref 1.7–7.7)
Neutrophils Relative %: 53 %
Platelets: 349 10*3/uL (ref 150–400)
RBC: 2.56 MIL/uL — ABNORMAL LOW (ref 3.87–5.11)
RDW: 15.4 % (ref 11.5–15.5)
WBC: 7.9 10*3/uL (ref 4.0–10.5)
nRBC: 0 % (ref 0.0–0.2)

## 2019-06-23 NOTE — Progress Notes (Signed)
Gina [name] ([id number]) Visit Report for 06/18/2019 Allergy List Details Patient Name: Date of Service: Gina [name], Gina [name] 06/18/2019 2:45 PM Medical Record [id number] Patient Account Number: [id number] Date of Birth/Sex: Treating RN: [birth year] (59 y.o. Female) Levan Hurst Primary Care Niki Cosman: Apolonio Schneiders Other Clinician: Referring Mckinzie Saksa: Treating Lindsea Olivar/Extender:Stone III, Jesus Genera, ANDREW Weeks in Treatment: 0 Allergies Active Allergies bee venom protein (honey bee) Reaction: swelling Severity: Moderate metformin Reaction: upset stomach Severity: Moderate Allergy Notes Electronic Signature(s) Signed: 06/23/2019 6:12:10 PM By: Levan Hurst RN, BSN Entered By: Levan Hurst on 06/18/2019 16:00:43 -------------------------------------------------------------------------------- Arrival Information Details Patient Name: Date of Service: Gina [name] 06/18/2019 2:45 PM Medical Record [id number] Patient Account Number: [id number] Date of Birth/Sex: Treating RN: 1960-11-17 (59 y.o. Female) Levan Hurst Primary Care Zyanna Leisinger: Apolonio Schneiders Other Clinician: Referring Corleone Biegler: Treating Shawneequa Baldridge/Extender:Stone III, Jesus Genera, ANDREW Weeks in Treatment: 0 Visit Information Patient Arrived: Wheel Chair Arrival Time: 15:17 Accompanied By: daughter Transfer Assistance: Manual Patient Identification Verified: Yes Secondary Verification Process Completed: Yes Patient Requires Transmission-Based No Precautions: Patient Has Alerts: No Electronic Signature(s) Signed: 06/23/2019 6:12:10 PM By: Levan Hurst RN, BSN Entered By: Levan Hurst on 06/18/2019 15:18:14 -------------------------------------------------------------------------------- Clinic Level of Care Assessment Details Patient Name: Date of Service: Gina [name] 06/18/2019 2:45 PM Medical Record [id number] Patient Account Number: [id number] Date of Birth/Sex: Treating  RN: 12/17/1960 (59 y.o. Female) Baruch Gouty Primary Care Keyleigh Manninen: Apolonio Schneiders Other Clinician: Referring Marquail Bradwell: Treating Jenny Lai/Extender:Stone III, Jesus Genera, ANDREW Weeks in Treatment: 0 Clinic Level of Care Assessment Items TOOL 2 Quantity Score []  - Use when only an EandM is performed on the INITIAL visit 0 ASSESSMENTS - Nursing Assessment / Reassessment X - General Physical Exam (combine w/ comprehensive assessment (listed just below) 1 20 when performed on new pt. evals) X - Comprehensive Assessment (HX, ROS, Risk Assessments, Wounds Hx, etc.) 1 25 ASSESSMENTS - Wound and Skin Assessment / Reassessment []  - Simple Wound Assessment / Reassessment - one wound 0 X - Complex Wound Assessment / Reassessment - multiple wounds 6 5 []  - Dermatologic / Skin Assessment (not related to wound area) 0 ASSESSMENTS - Ostomy and/or Continence Assessment and Care []  - Incontinence Assessment and Management 0 []  - Ostomy Care Assessment and Management (repouching, etc.) 0 PROCESS - Coordination of Care X - Simple Patient / Family Education for ongoing care 1 15 []  - Complex (extensive) Patient / Family Education for ongoing care 0 X - Staff obtains Programmer, systems, Records, Test Results / Process Orders 1 10 X - Staff telephones HHA, Nursing Homes / Clarify orders / etc 1 10 []  - Routine Transfer to another Facility (non-emergent condition) 0 []  - Routine Hospital Admission (non-emergent condition) 0 []  - New Admissions / Biomedical engineer / Ordering NPWT, Apligraf, etc. 0 []  - Emergency Hospital Admission (emergent condition) 0 X - Simple Discharge Coordination 1 10 []  - Complex (extensive) Discharge Coordination 0 PROCESS - Special Needs []  - Pediatric / Minor Patient Management 0 []  - Isolation Patient Management 0 []  - Hearing / Language / Visual special needs 0 []  - Assessment of Community assistance (transportation, D/C planning, etc.) 0 []  - Additional assistance / Altered  mentation 0 []  - Support Surface(s) Assessment (bed, cushion, seat, etc.) 0 INTERVENTIONS - Wound Cleansing / Measurement X - Wound Imaging (photographs - any number of wounds) 1 5 []  - Wound Tracing (instead of photographs) 0 []  - Simple Wound Measurement - one wound 0 X - Complex Wound Measurement - multiple wounds 6 5 []  - Simple Wound Cleansing -  one wound 0 X - Complex Wound Cleansing - multiple wounds 6 5 INTERVENTIONS - Wound Dressings X - Small Wound Dressing one or multiple wounds 6 10 []  - Medium Wound Dressing one or multiple wounds 0 []  - Large Wound Dressing one or multiple wounds 0 []  - Application of Medications - injection 0 INTERVENTIONS - Miscellaneous []  - External ear exam 0 []  - Specimen Collection (cultures, biopsies, blood, body fluids, etc.) 0 []  - Specimen(s) / Culture(s) sent or taken to Lab for analysis 0 []  - Patient Transfer (multiple staff / Harrel Lemon Lift / Similar devices) 0 []  - Simple Staple / Suture removal (25 or less) 0 []  - Complex Staple / Suture removal (26 or more) 0 []  - Hypo / Hyperglycemic Management (close monitor of Blood Glucose) 0 []  - Ankle / Brachial Index (ABI) - do not check if billed separately 0 Has the patient been seen at the hospital within the last three years: Yes Total Score: 245 Level Of Care: New/Established - Level 5 Electronic Signature(s) Signed: 06/18/2019 6:07:56 PM By: Baruch Gouty RN, BSN Entered By: Baruch Gouty on 06/18/2019 16:42:38 -------------------------------------------------------------------------------- Encounter Discharge Information Details Patient Name: Date of Service: Gina [name] 06/18/2019 2:45 PM Medical Record [id number] Patient Account Number: [id number] Date of Birth/Sex: Treating RN: [birth year] (59 y.o. Female) Carlene Coria Primary Care Tyon Cerasoli: Apolonio Schneiders Other Clinician: Referring Mica Releford: Treating Janasha Barkalow/Extender:Stone III, Jesus Genera, ANDREW Weeks in Treatment:  0 Encounter Discharge Information Items Discharge Condition: Stable Ambulatory Status: Wheelchair Discharge Destination: Home Transportation: Private Auto Accompanied By: daughter Schedule Follow-up Appointment: Yes Clinical Summary of Care: Patient Declined Electronic Signature(s) Signed: 06/18/2019 5:52:27 PM By: Carlene Coria RN Entered By: Carlene Coria on 06/18/2019 17:15:12 -------------------------------------------------------------------------------- Multi-Disciplinary Care Plan Details Patient Name: Date of Service: ERNESTINE, ROHMAN 06/18/2019 2:45 PM Medical Record [id number] Patient Account Number: [id number] Date of Birth/Sex: Treating RN: December 24, 1960 (59 y.o. Female) Baruch Gouty Primary Care Emalia Witkop: Apolonio Schneiders Other Clinician: Referring Wilhelm Ganaway: Treating Soffia Doshier/Extender:Stone III, Jesus Genera, ANDREW Weeks in Treatment: 0 Active Inactive Nutrition Nursing Diagnoses: Impaired glucose control: actual or potential Potential for alteratiion in Nutrition/Potential for imbalanced nutrition Goals: Patient/caregiver will maintain therapeutic glucose control Date Initiated: 06/18/2019 Target Resolution Date: 07/16/2019 Goal Status: Active Interventions: Assess HgA1c results as ordered upon admission and as needed Assess patient nutrition upon admission and as needed per policy Treatment Activities: Patient referred to Primary Care Physician for further nutritional evaluation : 06/18/2019 Notes: Pressure Nursing Diagnoses: Knowledge deficit related to causes and risk factors for pressure ulcer development Knowledge deficit related to management of pressures ulcers Potential for impaired tissue integrity related to pressure, friction, moisture, and shear Goals: Patient/caregiver will verbalize understanding of pressure ulcer management Date Initiated: 06/18/2019 Target Resolution Date: 07/16/2019 Goal Status: Active Interventions: Assess: immobility, friction,  shearing, incontinence upon admission and as needed Assess offloading mechanisms upon admission and as needed Assess potential for pressure ulcer upon admission and as needed Provide education on pressure ulcers Notes: Wound/Skin Impairment Nursing Diagnoses: Impaired tissue integrity Knowledge deficit related to ulceration/compromised skin integrity Goals: Patient/caregiver will verbalize understanding of skin care regimen Date Initiated: 06/18/2019 Target Resolution Date: 07/16/2019 Goal Status: Active Ulcer/skin breakdown will have a volume reduction of 30% by week 4 Date Initiated: 06/18/2019 Target Resolution Date: 07/16/2019 Goal Status: Active Interventions: Assess patient/caregiver ability to obtain necessary supplies Assess patient/caregiver ability to perform ulcer/skin care regimen upon admission and as needed Assess ulceration(s) every visit Provide education on ulcer and skin care Treatment Activities: Skin care regimen initiated :  06/18/2019 Topical wound management initiated : 06/18/2019 Notes: Electronic Signature(s) Signed: 06/18/2019 6:07:56 PM By: Baruch Gouty RN, BSN Entered By: Baruch Gouty on 06/18/2019 16:41:07 -------------------------------------------------------------------------------- Pain Assessment Details Patient Name: Date of Service: YURIANNA, TUSING 06/18/2019 2:45 PM Medical Record [id number] Patient Account Number: [id number] Date of Birth/Sex: Treating RN: 1960-12-01 (59 y.o. Female) Levan Hurst Primary Care Loredana Medellin: Apolonio Schneiders Other Clinician: Referring Benson Porcaro: Treating Jalaila Caradonna/Extender:Stone III, Jesus Genera, ANDREW Weeks in Treatment: 0 Active Problems Location of Pain Severity and Description of Pain Patient Has Paino Yes Site Locations Pain Management and Medication Current Pain Management: Electronic Signature(s) Signed: 06/23/2019 6:12:10 PM By: Levan Hurst RN, BSN Entered By: Levan Hurst on 06/18/2019  16:10:35 -------------------------------------------------------------------------------- Patient/Caregiver Education Details Patient Name: [name] 4/7/2021andnbsp2:45 Date of Service: PM Medical Record [id number] Number: Patient Account Number: [id number] Treating RN: 1960/12/13 (59 y.o. Baruch Gouty Date of Birth/Gender: Female) Other Clinician: Primary Care Treating LAMB, Ledora Bottcher Physician: Physician/Extender: Referring Physician: Rosalio Macadamia in Treatment: 0 Education Assessment Education Provided To: Patient Education Topics Provided Pressure: Handouts: Pressure Ulcers: Care and Offloading, Pressure Ulcers: Care and Offloading 2 Methods: Explain/Verbal, Printed Responses: Reinforcements needed, State content correctly Wound/Skin Impairment: Handouts: Caring for Your Ulcer, Skin Care Do's and Dont's Methods: Explain/Verbal, Printed Responses: Reinforcements needed, State content correctly Electronic Signature(s) Signed: 06/18/2019 6:07:56 PM By: Baruch Gouty RN, BSN Entered By: Baruch Gouty on 06/18/2019 16:41:41 -------------------------------------------------------------------------------- Wound Assessment Details Patient Name: Date of Service: KAHLEN, BOYDE 06/18/2019 2:45 PM Medical Record [id number] Patient Account Number: [id number] Date of Birth/Sex: Treating RN: [birth year] (59 y.o. Female) Levan Hurst Primary Care Germaine Shenker: Apolonio Schneiders Other Clinician: Referring Jelissa Espiritu: Treating Jeric Slagel/Extender:Stone III, Jesus Genera, ANDREW Weeks in Treatment: 0 Wound Status Wound Number: 1 Primary Etiology: Pressure Ulcer Wound Location: Left, Lateral Amputation Site - Wound Status: Open Below Knee Wounding Event: Blister Date Acquired: 01/12/2019 Weeks Of Treatment: 0 Clustered Wound: No Wound Measurements Length: (cm) 1.5 Width: (cm) 2 Depth: (cm) 0.1 Area: (cm) 2.356 Volume: (cm) 0.236 Wound  Description Classification: Category/Stage II Wound Margin: Flat and Intact Exudate Amount: Medium Exudate Type: Serosanguineous Exudate Color: red, brown Wound Bed Granulation Amount: Large (67-100%) Granulation Quality: Pink Necrotic Amount: None Present (0%) Exposed Structure ed: No ubcutaneous Tissue) Exposed: Yes ed: No ed: No d: No : No % Reduction in Area: % Reduction in Volume: Epithelialization: None Tunneling: No Undermining: No Fascia Expos Fat Layer (S Tendon Expos Muscle Expos Joint Expose Bone Exposed Treatment Notes Wound #1 (Left, Lateral Amputation Site - Below Knee) 1. Cleanse With Wound Cleanser 3. Primary Dressing Applied Hydrofera Blue 4. Secondary Dressing Dry Gauze Roll Gauze 5. Secured With Recruitment consultant) Signed: 06/23/2019 6:12:10 PM By: Levan Hurst RN, BSN Entered By: Levan Hurst on 06/18/2019 15:51:30 -------------------------------------------------------------------------------- Wound Assessment Details Patient Name: Date of Service: BELLARAE, LIZER 06/18/2019 2:45 PM Medical Record [id number] Patient Account Number: [id number] Date of Birth/Sex: Treating RN: 1960/12/30 (59 y.o. Female) Levan Hurst Primary Care Nahuel Wilbert: Apolonio Schneiders Other Clinician: Referring Caldonia Leap: Treating Miranda Frese/Extender:Stone III, Jesus Genera, ANDREW Weeks in Treatment: 0 Wound Status Wound Number: 2 Primary Etiology: Pressure Ulcer Wound Location: Left, Posterior Amputation Site - Wound Status: Open Below Knee Wounding Event: Blister Date Acquired: 01/12/2019 Weeks Of Treatment: 0 Clustered Wound: No Wound Measurements Length: (cm) 4.8 Width: (cm) 11.5 Depth: (cm) 0.1 Area: (cm) 43.354 Volume: (cm) 4.335 Wound Description Classification: Category/Stage III Wound Margin: Flat and Intact Exudate Amount: Large Wound Bed Granulation Amount: Medium (34-66%) Granulation Quality: Pink Necrotic Amount: Medium  (  34-66%) Necrotic Quality: Eschar, Adherent Slough After Cleansing: No brino Yes Exposed Structure posed: No (Subcutaneous Tissue) Exposed: Yes posed: No posed: No osed: No sed: No % Reduction in Area: % Reduction in Volume: Epithelialization: None Tunneling: No Undermining: No Foul Odor Slough/Fi Fascia Ex Fat Layer Tendon Ex Muscle Ex Joint Exp Bone Expo Treatment Notes Wound #2 (Left, Posterior Amputation Site - Below Knee) 1. Cleanse With Wound Cleanser 3. Primary Dressing Applied Hydrofera Blue 4. Secondary Dressing Dry Gauze Roll Gauze 5. Secured With Recruitment consultant) Signed: 06/23/2019 6:12:10 PM By: Levan Hurst RN, BSN Entered By: Levan Hurst on 06/18/2019 15:53:08 -------------------------------------------------------------------------------- Wound Assessment Details Patient Name: Date of Service: OFFIE, PICKRON 06/18/2019 2:45 PM Medical Record [id number] Patient Account Number: [id number] Date of Birth/Sex: Treating RN: October 31, 1960 (59 y.o. Female) Levan Hurst Primary Care Padraig Nhan: Apolonio Schneiders Other Clinician: Referring Jyl Chico: Treating Lowen Barringer/Extender:Stone III, Jesus Genera, ANDREW Weeks in Treatment: 0 Wound Status Wound Number: 3 Primary Etiology: Pressure Ulcer Wound Location: Left, Medial Upper Leg Wound Status: Open Wounding Event: Blister Date Acquired: 01/12/2019 Weeks Of Treatment: 0 Clustered Wound: No Wound Measurements Length: (cm) 7 % Reduction Width: (cm) 2.5 % Reduction Depth: (cm) 0.1 Epithelializ Area: (cm) 13.744 Tunneling: Volume: (cm) 1.374 Undermining Wound Description Classification: Category/Stage II Foul Odor A Wound Margin: Fibrotic scar, thickened scar Slough/Fibr Exudate Amount: Medium Exudate Type: Serosanguineous Exudate Color: red, brown Wound Bed Granulation Amount: Large (67-100%) Granulation Quality: Pink, Hyper-granulation Fascia Expos Necrotic Amount: None Present  (0%) Fat Layer (S Tendon Expos Muscle Expos Joint Expose Bone Exposed fter Cleansing: No ino No Exposed Structure ed: No ubcutaneous Tissue) Exposed: Yes ed: No ed: No d: No : No in Area: in Volume: ation: None No : No Treatment Notes Wound #3 (Left, Medial Upper Leg) 1. Cleanse With Wound Cleanser 3. Primary Dressing Applied Hydrofera Blue 4. Secondary Dressing Dry Gauze Roll Gauze 5. Secured With Recruitment consultant) Signed: 06/23/2019 6:12:10 PM By: Levan Hurst RN, BSN Entered By: Levan Hurst on 06/18/2019 15:54:37 -------------------------------------------------------------------------------- Wound Assessment Details Patient Name: Date of Service: DAYELIN, BALDUCCI 06/18/2019 2:45 PM Medical Record [id number] Patient Account Number: [id number] Date of Birth/Sex: Treating RN: 1960-06-10 (59 y.o. 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 Wound Status: Open Wounding Event: Blister Date Acquired: 01/12/2019 Weeks Of Treatment: 0 Clustered Wound: No Wound Measurements Length: (cm) 2.6 Width: (cm) 6.5 Depth: (cm) 0.1 Area: (cm) 13.273 Volume: (cm) 1.327 Wound Description Classification: Category/Stage III Wound Margin: Distinct, outline attached Exudate Amount: Medium Exudate Type: Serosanguineous Exudate Color: red, brown Wound Bed Granulation Amount: Large (67-100%) Granulation Quality: Pink, Hyper-granulation Necrotic Amount: Small (1-33%) Necrotic Quality: Adherent Slough After Cleansing: No brino Yes Exposed Structure osed: No (Subcutaneous Tissue) Exposed: Yes osed: No osed: No sed: No ed: No % Reduction in Area: % Reduction in Volume: Epithelialization: None Tunneling:  No Undermining: No Foul Odor Slough/Fi Fascia Exp Fat Layer Tendon Exp Muscle Exp Joint Expo Bone Expos Electronic Signature(s) Signed: 06/23/2019 6:12:10 PM By: Levan Hurst RN, BSN Entered By: Levan Hurst on 06/18/2019 15:56:15 -------------------------------------------------------------------------------- Wound Assessment Details Patient Name: Date of Service: GURBANI, FIGGE 06/18/2019 2:45 PM Medical Record [id number] Patient Account Number: [id number] Date of Birth/Sex: Treating RN: 04/04/1960 (59 y.o. Female) Levan Hurst Primary Care Millianna Szymborski: Apolonio Schneiders Other Clinician: Referring Pasha Broad: Treating Bynum Mccullars/Extender:Stone III, Jesus Genera, ANDREW  Weeks in Treatment: 0 Wound Status Wound Number: 5 Primary Etiology: Pressure Ulcer Wound Location: Right, Lateral Upper Leg Wound Status: Open Wounding Event: Blister Date Acquired: 01/12/2019 Weeks Of Treatment: 0 Clustered Wound: No Wound Measurements Length: (cm) 8 Width: (cm) 2 Depth: (cm) 0.1 Area: (cm) 12.566 Volume: (cm) 1.257 Wound Description Classification: Category/Stage II Wound Margin: Distinct, outline attached Exudate Amount: Medium Exudate Type: Serosanguineous Exudate Color: red, brown Wound Bed Granulation Amount: Large (67-100%) Granulation Quality: Pink, Hyper-granulation Necrotic Amount: None Present (0%) r After Cleansing: No Exposed Structure posed: No (Subcutaneous Tissue) Exposed: Yes posed: No posed: No osed: No sed: No % Reduction in Area: % Reduction in Volume: Epithelialization: None Tunneling: No Undermining: No Foul Odo Fascia Ex Fat Layer Tendon Ex Muscle Ex Joint Exp Bone Expo Treatment Notes Wound #5 (Right, Lateral Upper Leg) 1. Cleanse With Wound Cleanser 3. Primary Dressing Applied Hydrofera Blue 4. Secondary Dressing Dry Gauze Roll Gauze 5. Secured With Recruitment consultant) Signed: 06/23/2019 6:12:10 PM By: Levan Hurst RN,  BSN Entered By: Levan Hurst on 06/18/2019 15:57:59 -------------------------------------------------------------------------------- Wound Assessment Details Patient Name: Date of Service: AIRYN, ELLZEY 06/18/2019 2:45 PM Medical Record [id number] Patient Account Number: [id number] Date of Birth/Sex: Treating RN: 03/17/1960 (59 y.o. Female) Levan Hurst Primary Care Mabry Tift: Apolonio Schneiders Other Clinician: Referring Laquasia Pincus: Treating Otilio Groleau/Extender:Stone III, Jesus Genera, ANDREW Weeks in Treatment: 0 Wound Status Wound Number: 6 Primary Etiology: Pressure Ulcer Wound Location: Sacrum Wound Status: Open Wounding Event: Pressure Injury Date Acquired: 01/12/2019 Weeks Of Treatment: 0 Clustered Wound: No Wound Measurements Length: (cm) 6 % Reduction Width: (cm) 4 % Reduction Depth: (cm) 0.5 Epithelializ Area: (cm) 18.85 Tunneling: Volume: (cm) 9.425 Undermining Wound Description Classification: Category/Stage III Foul Odor A Wound Margin: Flat and Intact Slough/Fibr Exudate Amount: Medium Exudate Type: Serosanguineous Exudate Color: red, brown Wound Bed Granulation Amount: Large (67-100%) Granulation Quality: Pink Fascia Expos Necrotic Amount: Small (1-33%) Fat Layer (S Necrotic Quality: Adherent Slough Tendon Expos Muscle Expos Joint Expose Bone Exposed fter Cleansing: No ino Yes Exposed Structure ed: No ubcutaneous Tissue) Exposed: Yes ed: No ed: No d: No : No in Area: in Volume: ation: None No : No Treatment Notes Wound #6 (Sacrum) 1. Cleanse With Wound Cleanser 3. Primary Dressing Applied Hydrofera Blue 4. Secondary Dressing Dry Gauze Roll Gauze 5. Secured With Recruitment consultant) Signed: 06/23/2019 6:12:10 PM By: Levan Hurst RN, BSN Entered By: Levan Hurst on 06/18/2019 15:59:05 -------------------------------------------------------------------------------- [place] Details Patient Name: Date of Service: OMYA, WINFIELD 06/18/2019 2:45 PM Medical Record [id number] Patient Account Number: [id number] Date of Birth/Sex: Treating RN: [birth year] (59 y.o. Female) Levan Hurst Primary Care Marrissa Dai: Apolonio Schneiders Other Clinician: Referring Sye Schroepfer: Treating Shaquavia Whisonant/Extender:Stone III, Jesus Genera, ANDREW Weeks in Treatment: 0 Vital Signs Time Taken: 15:19 Temperature (F): 98.8 Height (in): 62 Pulse (bpm): 72 Source: Stated Respiratory Rate (breaths/min): 16 Weight (lbs): 110 Blood Pressure (mmHg): 126/66 Source: Stated Reference Range: 80 - 120 mg / dl Body Mass Index (BMI): 20.1 Electronic Signature(s) Signed: 06/23/2019 6:12:10 PM By: Levan Hurst RN, BSN Entered By: Levan Hurst on 06/18/2019 15:20:32

## 2019-06-23 NOTE — Progress Notes (Signed)
REMA, LIEVANOS ([id number]) Visit Report for 06/18/2019 Chief Complaint Document Details Patient Name: Date of Service: Gina [name], Gina [name] 06/18/2019 2:45 PM Medical Record [id number] Patient Account Number: [id number] Date of Birth/Sex: Treating RN: 1960-08-04 (59 y.o. Female) Baruch Gouty Primary Care Provider: Apolonio Schneiders Other Clinician: Referring Provider: Treating Provider/Extender:Stone III, Jesus Genera, ANDREW Weeks in Treatment: 0 Information Obtained from: Patient Chief Complaint Multiple LE Pressure Ulcers Electronic Signature(s) Signed: 06/18/2019 4:33:36 PM By: Worthy Keeler PA-C Entered By: Worthy Keeler on 06/18/2019 16:33:35 -------------------------------------------------------------------------------- HPI Details Patient Name: Date of Service: Gina [name], Gina [name] 06/18/2019 2:45 PM Medical Record [id number] Patient Account Number: [id number] Date of Birth/Sex: Treating RN: 1960-05-28 (59 y.o. Female) Baruch Gouty Primary Care Provider: Apolonio Schneiders Other Clinician: Referring Provider: Treating Provider/Extender:Stone III, Jesus Genera, ANDREW Weeks in Treatment: 0 History of Present Illness HPI Description: 06/18/2019 upon evaluation today patient appears to be doing somewhat poorly in regard to her lower extremities as well as her sacral region at this point. Unfortunately she has been having a lot of significant issues here since the latter part of 2020 headed into 2021. She was at 1 point in a skilled nursing facility and then subsequently has come home to live with her son and daughter who help take care of her. She unfortunately has had to lower extremity amputations below the knee. On the distal amputation sites she actually has open wounds noted there that are pressure in nature there are also wounds on her legs at other sites and it is questionable if this is pressure versus other causative mechanisms. She also has a significant pressure ulcer on the  sacral region. All seem to be somewhat hyper granular except for the sacral region although I believe Hydrofera Blue could be beneficial here as well for her. She does have a history of hypertension, moderate protein calorie malnutrition and currently is fed By way of TPN. She also has an air mattress which is on the way they ordered this themselves and should be arriving within the next 24 to 48 hours. She already has a hospital bed at home. It apparently has been discussed whether or not palliative care should be involved and again the patient does appear to be quite significant in regard to her wound. With that being said she did have a hemoglobin A1c in September 2020 of around 15 as of March 2021 this was down to 5.2 that is excellent. Since November however she has been getting worse in regard to the wounds. Electronic Signature(s) Signed: 06/18/2019 6:59:58 PM By: Worthy Keeler PA-C Entered By: Worthy Keeler on 06/18/2019 18:59:58 -------------------------------------------------------------------------------- Physical Exam Details Patient Name: Date of Service: Gina [name], Gina [name] 06/18/2019 2:45 PM Medical Record [id number] Patient Account Number: [id number] Date of Birth/Sex: Treating RN: Jun 25, 1960 (59 y.o. Female) Baruch Gouty Primary Care Provider: Apolonio Schneiders Other Clinician: Referring Provider: Treating Provider/Extender:Stone III, Jesus Genera, ANDREW Weeks in Treatment: 0 Constitutional supine blood pressure is within target range for patient.. pulse regular and within target range for patient.[name] respirations regular, non-labored and within target range for patient.[name] temperature within target range for patient.. Thin and well-hydrated in no acute distress. Eyes conjunctiva clear no eyelid edema noted. pupils equal round and reactive to light and accommodation. Ears, Nose, Mouth, and Throat no gross abnormality of ear auricles or external auditory canals. normal  hearing noted during conversation. mucus membranes moist. Respiratory normal breathing without difficulty. Cardiovascular 1+ pitting edema of the bilateral lower extremities. The patient has bilateral below-knee amputations. Musculoskeletal Patient unable  to walk Secondary to the bilateral below-knee amputations.[name] Psychiatric this patient is able to make decisions and demonstrates good insight into disease process. Alert and Oriented x 3. pleasant and cooperative. Notes Patient's wounds currently showed signs of significant hyper granulation pretty much throughout. She is very thin and somewhat malnourished. She appears to be chronically ill. With that being said she does seem to be in fairly good spirits she was very talkative today that was encouraging. With that being said I do still feel like that her wounds are quite significant and widespread over her lower extremities she also does have a significant wound over the sacral region which apparently causes her quite a bit of discomfort unfortunately. Electronic Signature(s) Signed: 06/18/2019 7:01:04 PM By: Worthy Keeler PA-C Entered By: Worthy Keeler on 06/18/2019 19:01:04 -------------------------------------------------------------------------------- Physician Orders Details Patient Name: Date of Service: MORIA, BROPHY 06/18/2019 2:45 PM Medical Record [id number] Patient Account Number: [id number] Date of Birth/Sex: Treating RN: 03-Mar-1961 (60 y.o. Female) Baruch Gouty Primary Care Provider: Apolonio Schneiders Other Clinician: Referring Provider: Treating Provider/Extender:Stone III, Jesus Genera, ANDREW Weeks in Treatment: 0 Verbal / Phone Orders: No Diagnosis Coding ICD-10 Coding Code Description L89.153 Pressure ulcer of sacral region, stage 3 L89.893 Pressure ulcer of other site, stage 3 L89.892 Pressure ulcer of other site, stage 2 Z89.511 Acquired absence of right leg below knee Z89.512 Acquired absence of left  leg below knee I10 Essential (primary) hypertension E44.0 Moderate protein-calorie malnutrition Follow-up Appointments Return Appointment in 1 week. Dressing Change Frequency Change Dressing every other day. - or daily if needed Wound Cleansing Clean wound with Wound Cleanser May shower and wash wound with soap and water. Primary Wound Dressing Hydrofera Blue - ready on all wounds Secondary Dressing Wound #6 Sacrum Foam Border - or ABD secured with tape Wound #1 Left,Lateral Amputation Site - Below Knee Kerlix/Rolled Gauze Dry Gauze ABD pad Wound #2 Left,Posterior Amputation Site - Below Knee Kerlix/Rolled Gauze Dry Gauze ABD pad Wound #3 Left,Medial Upper Leg Kerlix/Rolled Gauze Dry Gauze ABD pad Wound #4 Right Amputation Site - Below Knee Kerlix/Rolled Gauze Dry Gauze ABD pad Wound #5 Right,Lateral Upper Leg Kerlix/Rolled Gauze Dry Gauze ABD pad Off-Loading Low air-loss mattress (Group 2) Turn and reposition every 2 hours [place] [facility] [place] for Skilled Nursing Electronic Signature(s) Signed: 06/18/2019 6:07:56 PM By: Baruch Gouty RN, BSN Signed: 06/18/2019 7:02:55 PM By: Worthy Keeler PA-C Entered By: Baruch Gouty on 06/18/2019 16:47:15 -------------------------------------------------------------------------------- Problem List Details Patient Name: Date of Service: Gina [name], Gina [name] 06/18/2019 2:45 PM Medical Record [id number] Patient Account Number: [id number] Date of Birth/Sex: Treating RN: 10-Dec-1960 (59 y.o. Female) Baruch Gouty Primary Care Provider: Apolonio Schneiders Other Clinician: Referring Provider: Treating Provider/Extender:Stone III, Jesus Genera, ANDREW Weeks in Treatment: 0 Active Problems ICD-10 Evaluated Encounter Code Description Active Date Today Diagnosis L89.153 Pressure ulcer of sacral region, stage 3 06/18/2019 No Yes L89.893 Pressure ulcer of other site, stage 3 06/18/2019 No Yes L89.892 Pressure ulcer of other  site, stage 2 06/18/2019 No Yes Z89.511 Acquired absence of right leg below knee 06/18/2019 No Yes Z89.512 Acquired absence of left leg below knee 06/18/2019 No Yes I10 Essential (primary) hypertension 06/18/2019 No Yes E44.0 Moderate protein-calorie malnutrition 06/18/2019 No Yes Inactive Problems Resolved Problems Electronic Signature(s) Signed: 06/18/2019 4:33:16 PM By: Worthy Keeler PA-C Entered By: Worthy Keeler on 06/18/2019 16:33:15 -------------------------------------------------------------------------------- Progress Note Details Patient Name: Date of Service: Gina [name], Gina [name] 06/18/2019 2:45 PM Medical Record [id number] Patient Account Number: [id number] Date of Birth/Sex:  Treating RN: 1960-10-03 (59 y.o. Female) Baruch Gouty Primary Care Provider: Apolonio Schneiders Other Clinician: Referring Provider: Treating Provider/Extender:Stone III, Jesus Genera, ANDREW Weeks in Treatment: 0 Subjective Chief Complaint Information obtained from Patient Multiple LE Pressure Ulcers History of Present Illness (HPI) 06/18/2019 upon evaluation today patient appears to be doing somewhat poorly in regard to her lower extremities as well as her sacral region at this point. Unfortunately she has been having a lot of significant issues here since the latter part of 2020 headed into 2021. She was at 1 point in a skilled nursing facility and then subsequently has come home to live with her son and daughter who help take care of her. She unfortunately has had to lower extremity amputations below the knee. On the distal amputation sites she actually has open wounds noted there that are pressure in nature there are also wounds on her legs at other sites and it is questionable if this is pressure versus other causative mechanisms. She also has a significant pressure ulcer on the sacral region. All seem to be somewhat hyper granular except for the sacral region although I believe Hydrofera Blue could be  beneficial here as well for her. She does have a history of hypertension, moderate protein calorie malnutrition and currently is fed By way of TPN. She also has an air mattress which is on the way they ordered this themselves and should be arriving within the next 24 to 48 hours. She already has a hospital bed at home. It apparently has been discussed whether or not palliative care should be involved and again the patient does appear to be quite significant in regard to her wound. With that being said she did have a hemoglobin A1c in September 2020 of around 15 as of March 2021 this was down to 5.2 that is excellent. Since November however she has been getting worse in regard to the wounds. Patient History Allergies bee venom protein (honey bee) (Severity: Moderate, Reaction: swelling), metformin (Severity: Moderate, Reaction: upset stomach) Family History Unknown History. Social History Never smoker, Alcohol Use - Never, Caffeine Use - Rarely. Medical History Hematologic/Lymphatic Patient has history of Anemia Cardiovascular Patient has history of Hypertension, Peripheral Arterial Disease Endocrine Patient has history of Type II Diabetes Musculoskeletal Patient has history of Osteomyelitis Patient is treated with Controlled Diet. Blood sugar is not tested. Medical And Surgical History Notes Gastrointestinal hx perforated ulcer Musculoskeletal bilateral below knee amputation Psychiatric Schizophrenia Review of Systems (ROS) Constitutional Symptoms (General Health) Denies complaints or symptoms of Fatigue, Fever, Chills, Marked Weight Change. Eyes Denies complaints or symptoms of Dry Eyes, Vision Changes, Glasses / Contacts. Ear/Nose/Mouth/Throat Denies complaints or symptoms of Chronic sinus problems or rhinitis. Respiratory Denies complaints or symptoms of Chronic or frequent coughs, Shortness of Breath. Integumentary (Skin) Complains or has symptoms of Wounds - multiple  wounds on bilateral lower extremities and sacrum. Neurologic Denies complaints or symptoms of Numbness/parasthesias. Psychiatric Denies complaints or symptoms of Claustrophobia, Suicidal. Objective Constitutional supine blood pressure is within target range for patient.. pulse regular and within target range for patient.[name] respirations regular, non-labored and within target range for patient.[name] temperature within target range for patient.. Thin and well-hydrated in no acute distress. Vitals Time Taken: 3:19 PM, Height: 62 in, Source: Stated, Weight: 110 lbs, Source: Stated, BMI: 20.1, Temperature: 98.8 F, Pulse: 72 bpm, Respiratory Rate: 16 breaths/min, Blood Pressure: 126/66 mmHg. Eyes conjunctiva clear no eyelid edema noted. pupils equal round and reactive to light and accommodation. Ears, Nose, Mouth, and  Throat no gross abnormality of ear auricles or external auditory canals. normal hearing noted during conversation. mucus membranes moist. Respiratory normal breathing without difficulty. Cardiovascular 1+ pitting edema of the bilateral lower extremities. The patient has bilateral below-knee amputations. Musculoskeletal Patient unable to walk Secondary to the bilateral below-knee amputations.[name] Psychiatric this patient is able to make decisions and demonstrates good insight into disease process. Alert and Oriented x 3. pleasant and cooperative. General Notes: Patient's wounds currently showed signs of significant hyper granulation pretty much throughout. She is very thin and somewhat malnourished. She appears to be chronically ill. With that being said she does seem to be in fairly good spirits she was very talkative today that was encouraging. With that being said I do still feel like that her wounds are quite significant and widespread over her lower extremities she also does have a significant wound over the sacral region which apparently causes her quite a bit of discomfort  unfortunately. Integumentary (Hair, Skin) Wound #1 status is Open. Original cause of wound was Blister. The wound is located on the Left,Lateral Amputation Site - Below Knee. The wound measures 1.5cm length x 2cm width x 0.1cm depth; 2.356cm^2 area and 0.236cm^3 volume. There is Fat Layer (Subcutaneous Tissue) Exposed exposed. There is no tunneling or undermining noted. There is a medium amount of serosanguineous drainage noted. The wound margin is flat and intact. There is large (67-100%) pink granulation within the wound bed. There is no necrotic tissue within the wound bed. Wound #2 status is Open. Original cause of wound was Blister. The wound is located on the Left,Posterior Amputation Site - Below Knee. The wound measures 4.8cm length x 11.5cm width x 0.1cm depth; 43.354cm^2 area and 4.335cm^3 volume. There is Fat Layer (Subcutaneous Tissue) Exposed exposed. There is no tunneling or undermining noted. There is a large amount of drainage noted. The wound margin is flat and intact. There is medium (34-66%) pink granulation within the wound bed. There is a medium (34-66%) amount of necrotic tissue within the wound bed including Eschar and Adherent Slough. Wound #3 status is Open. Original cause of wound was Blister. The wound is located on the Left,Medial Upper Leg. The wound measures 7cm length x 2.5cm width x 0.1cm depth; 13.744cm^2 area and 1.374cm^3 volume. There is Fat Layer (Subcutaneous Tissue) Exposed exposed. There is no tunneling or undermining noted. There is a medium amount of serosanguineous drainage noted. The wound margin is fibrotic, thickened scar. There is large (67-100%) pink, hyper - granulation within the wound bed. There is no necrotic tissue within the wound bed. Wound #4 status is Open. Original cause of wound was Blister. The wound is located on the Right Amputation Site - Below Knee. The wound measures 2.6cm length x 6.5cm width x 0.1cm depth; 13.273cm^2 area and  1.327cm^3 volume. There is Fat Layer (Subcutaneous Tissue) Exposed exposed. There is no tunneling or undermining noted. There is a medium amount of serosanguineous drainage noted. The wound margin is distinct with the outline attached to the wound base. There is large (67-100%) pink, hyper - granulation within the wound bed. There is a small (1-33%) amount of necrotic tissue within the wound bed including Adherent Slough. Wound #5 status is Open. Original cause of wound was Blister. The wound is located on the Right,Lateral Upper Leg. The wound measures 8cm length x 2cm width x 0.1cm depth; 12.566cm^2 area and 1.257cm^3 volume. There is Fat Layer (Subcutaneous Tissue) Exposed exposed. There is no tunneling or undermining noted. There is a  medium amount of serosanguineous drainage noted. The wound margin is distinct with the outline attached to the wound base. There is large (67-100%) pink, hyper - granulation within the wound bed. There is no necrotic tissue within the wound bed. Wound #6 status is Open. Original cause of wound was Pressure Injury. The wound is located on the Sacrum. The wound measures 6cm length x 4cm width x 0.5cm depth; 18.85cm^2 area and 9.425cm^3 volume. There is Fat Layer (Subcutaneous Tissue) Exposed exposed. There is no tunneling or undermining noted. There is a medium amount of serosanguineous drainage noted. The wound margin is flat and intact. There is large (67-100%) pink granulation within the wound bed. There is a small (1-33%) amount of necrotic tissue within the wound bed including Adherent Slough. Assessment Active Problems ICD-10 Pressure ulcer of sacral region, stage 3 Pressure ulcer of other site, stage 3 Pressure ulcer of other site, stage 2 Acquired absence of right leg below knee Acquired absence of left leg below knee Essential (primary) hypertension Moderate protein-calorie malnutrition Plan Follow-up Appointments: Return Appointment in 1  week. Dressing Change Frequency: Change Dressing every other day. - or daily if needed Wound Cleansing: Clean wound with Wound Cleanser May shower and wash wound with soap and water. Primary Wound Dressing: Hydrofera Blue - ready on all wounds Secondary Dressing: Wound #6 Sacrum: Foam Border - or ABD secured with tape Wound #1 Left,Lateral Amputation Site - Below Knee: Kerlix/Rolled Gauze Dry Gauze ABD pad Wound #2 Left,Posterior Amputation Site - Below Knee: Kerlix/Rolled Gauze Dry Gauze ABD pad Wound #3 Left,Medial Upper Leg: Kerlix/Rolled Gauze Dry Gauze ABD pad Wound #4 Right Amputation Site - Below Knee: Kerlix/Rolled Gauze Dry Gauze ABD pad Wound #5 Right,Lateral Upper Leg: Kerlix/Rolled Gauze Dry Gauze ABD pad Off-Loading: Low air-loss mattress (Group 2) Turn and reposition every 2 hours Home Health: Admit to [facility] for Skilled Nursing 1. At this point based on what I am seeing I would recommend Hydrofera Blue to be used as a dressing of choice for the wounds of the lower extremities. I also believe this can be helpful for the sacral region we will see how things do over the next week. 2. I am also can recommend at this time that the patient continue with appropriate offloading the air mattress that her family ordered for her to go on her hospital bed should be beneficial. 3. With regard to the offloading I really think she needs to specifically be rotating positions at a minimum every 2 hours obviously more is better. We will see patient back for reevaluation in 1 week here in the clinic. If anything worsens or changes patient will contact our office for additional recommendations. Electronic Signature(s) Signed: 06/18/2019 7:01:53 PM By: Worthy Keeler PA-C Entered By: Worthy Keeler on 06/18/2019 19:01:53 -------------------------------------------------------------------------------- HxROS Details Patient Name: Date of Service: AYUSHI, PLA  06/18/2019 2:45 PM Medical Record [id number] Patient Account Number: [id number] Date of Birth/Sex: Treating RN: [birth year] (59 y.o. Female) Levan Hurst Primary Care Provider: Apolonio Schneiders Other Clinician: Referring Provider: Treating Provider/Extender:Stone III, Jesus Genera, ANDREW Weeks in Treatment: 0 Constitutional Symptoms (General Health) Complaints and Symptoms: Negative for: Fatigue; Fever; Chills; Marked Weight Change Eyes Complaints and Symptoms: Negative for: Dry Eyes; Vision Changes; Glasses / Contacts Ear/Nose/Mouth/Throat Complaints and Symptoms: Negative for: Chronic sinus problems or rhinitis Respiratory Complaints and Symptoms: Negative for: Chronic or frequent coughs; Shortness of Breath Integumentary (Skin) Complaints and Symptoms: Positive for: Wounds - multiple wounds on bilateral lower extremities and sacrum  Neurologic Complaints and Symptoms: Negative for: Numbness/parasthesias Psychiatric Complaints and Symptoms: Negative for: Claustrophobia; Suicidal Medical History: Past Medical History Notes: Schizophrenia Hematologic/Lymphatic Medical History: Positive for: Anemia Cardiovascular Medical History: Positive for: Hypertension; Peripheral Arterial Disease Gastrointestinal Medical History: Past Medical History Notes: hx perforated ulcer Endocrine Medical History: Positive for: Type II Diabetes Treated with: Diet Blood sugar tested every day: No Immunological Musculoskeletal Medical History: Positive for: Osteomyelitis Past Medical History Notes: bilateral below knee amputation Oncologic Immunizations Pneumococcal Vaccine: Received Pneumococcal Vaccination: Yes Implantable Devices None Family and Social History Unknown History: Yes; Never smoker; Alcohol Use: Never; Caffeine Use: Rarely; Financial Concerns: No; Food, Clothing or Shelter Needs: No; Support System Lacking: No; Transportation Concerns: No Electronic  Signature(s) Signed: 06/18/2019 7:02:55 PM By: Worthy Keeler PA-C Signed: 06/23/2019 6:12:10 PM By: Levan Hurst RN, BSN Entered By: Levan Hurst on 06/18/2019 16:08:50 -------------------------------------------------------------------------------- [place] Details Patient Name: Date of Service: Gina [name], Gina [name] 06/18/2019 Medical Record [id number] Patient Account Number: [id number] Date of Birth/Sex: Treating RN: September 13, 1960 (59 y.o. Female) Baruch Gouty Primary Care Provider: Apolonio Schneiders Other Clinician: Referring Provider: Treating Provider/Extender:Stone III, Jesus Genera, ANDREW Weeks in Treatment: 0 Diagnosis Coding ICD-10 Codes Code Description L89.153 Pressure ulcer of sacral region, stage 3 L89.893 Pressure ulcer of other site, stage 3 L89.892 Pressure ulcer of other site, stage 2 Z89.511 Acquired absence of right leg below knee Z89.512 Acquired absence of left leg below knee I10 Essential (primary) hypertension E44.0 Moderate protein-calorie malnutrition Facility Procedures CPT4 Code: [id number] Description: [phone] - WOUND CARE VISIT-LEV 5 EST PT Modifier: Quantity: 1 Physician Procedures CPT4 Code: [id number] Description: WC PHYS LEVEL 3 NEW PT ICD-10 Diagnosis Description L89.153 Pressure ulcer of sacral region, stage 3 L89.893 Pressure ulcer of other site, stage 3 L89.892 Pressure ulcer of other site, stage 2 Z89.511 Acquired absence of right leg below knee Modifier: Quantity: 1 Electronic Signature(s) Signed: 06/18/2019 7:02:23 PM By: Worthy Keeler PA-C Previous Signature: 06/18/2019 6:07:56 PM Version By: Baruch Gouty RN, BSN Entered By: Worthy Keeler on 06/18/2019 19:02:23

## 2019-06-23 NOTE — Progress Notes (Signed)
Gina, [name] ([id number]) Visit Report for 06/18/2019 Abuse/Suicide Risk Screen Details Patient Name: Date of Service: Gina [name], Gina [name] 06/18/2019 2:45 PM Medical Record [id number] Patient Account Number: [id number] Date of Birth/Sex: Treating RN: 08-17-1960 (59 y.o. Female) Levan Hurst Primary Care Khira Cudmore: Apolonio Schneiders Other Clinician: Referring Graceann Boileau: Treating Bryley Chrisman/Extender:Stone III, Jesus Genera, ANDREW Weeks in Treatment: 0 Abuse/Suicide Risk Screen Items Answer ABUSE RISK SCREEN: Has anyone close to you tried to hurt or harm you recentlyo No Do you feel uncomfortable with anyone in your familyo No Has anyone forced you do things that you didnt want to doo No Electronic Signature(s) Signed: 06/23/2019 6:12:10 PM By: Levan Hurst RN, BSN Entered By: Levan Hurst on 06/18/2019 16:08:57 -------------------------------------------------------------------------------- Activities of Daily Living Details Patient Name: Date of Service: Gina [name], Gina [name] 06/18/2019 2:45 PM Medical Record [id number] Patient Account Number: [id number] Date of Birth/Sex: Treating RN: 07/08/1960 (59 y.o. Female) Levan Hurst Primary Care Jamonta Goerner: Apolonio Schneiders Other Clinician: Referring Mikal Blasdell: Treating Able Malloy/Extender:Stone III, Jesus Genera, ANDREW Weeks in Treatment: 0 Activities of Daily Living Items Answer Activities of Daily Living (Please select one for each item) Drive Automobile Not Able Take Medications Completely Able Use Telephone Completely Able Care for Appearance Need Assistance Use Toilet Need Assistance Bath / Shower Need Assistance Dress Self Need Assistance Feed Self Completely Able Walk Not Able Get In / Out Bed Need Assistance Housework Need Assistance Prepare Meals Need Assistance Handle Money Need Assistance Shop for Self Need Assistance Electronic Signature(s) Signed: 06/23/2019 6:12:10 PM By: Levan Hurst RN, BSN Entered By: Levan Hurst on  06/18/2019 16:09:24 -------------------------------------------------------------------------------- Education Screening Details Patient Name: Date of Service: Gina [name], Gina [name] 06/18/2019 2:45 PM Medical Record [id number] Patient Account Number: [id number] Date of Birth/Sex: Treating RN: Oct 23, 1960 (59 y.o. Female) Levan Hurst Primary Care Zi Newbury: Apolonio Schneiders Other Clinician: Referring Britaney Espaillat: Treating Joline Encalada/Extender:Stone III, Jesus Genera, ANDREW Weeks in Treatment: 0 Primary Learner Assessed: Patient Learning Preferences/Education Level/Primary Language Learning Preference: Explanation, Demonstration, Printed Material Highest Education Level: High School Preferred Language: English Cognitive Barrier Language Barrier: No Translator Needed: No Memory Deficit: No Emotional Barrier: No Cultural/Religious Beliefs Affecting Medical Care: No Physical Barrier Impaired Vision: No Impaired Hearing: No Decreased Hand dexterity: No Knowledge/Comprehension Knowledge Level: High Comprehension Level: High Ability to understand written High instructions: Ability to understand verbal High instructions: Motivation Anxiety Level: Calm Cooperation: Cooperative Education Importance: Acknowledges Need Interest in Health Problems: Asks Questions Perception: Coherent Willingness to Engage in Self- High Management Activities: Readiness to Engage in Self- High Management Activities: Electronic Signature(s) Signed: 06/23/2019 6:12:10 PM By: Levan Hurst RN, BSN Entered By: Levan Hurst on 06/18/2019 16:09:43 -------------------------------------------------------------------------------- Fall Risk Assessment Details Patient Name: Date of Service: Gina [name], Gina [name] 06/18/2019 2:45 PM Medical Record [id number] Patient Account Number: [id number] Date of Birth/Sex: Treating RN: April 04, 1960 (59 y.o. Female) Levan Hurst Primary Care Sindi Beckworth: Apolonio Schneiders Other  Clinician: Referring Carlesha Seiple: Treating Rayona Sardinha/Extender:Stone III, Jesus Genera, ANDREW Weeks in Treatment: 0 Fall Risk Assessment Items Have you had 2 or more falls in the last 12 monthso 0 No Have you had any fall that resulted in injury in the last 12 monthso 0 No FALLS RISK SCREEN History of falling - immediate or within 3 months 25 Yes Secondary diagnosis (Do you have 2 or more medical diagnoseso) 0 No Ambulatory aid None/bed rest/wheelchair/nurse 0 Yes Crutches/cane/walker 0 No Furniture 0 No Intravenous therapy Access/Saline/Heparin Lock 0 No Weak (short steps with or without shuffle, stooped but able to lift head 0 No while walking, may seek support from furniture) Impaired (short steps  with shuffle, may have difficulty arising from chair, 0 No head down, impaired balance) Mental Status Oriented to own ability 0 Yes Overestimates or forgets limitations 0 No Risk Level: Medium Risk Score: 25 Electronic Signature(s) Signed: 06/23/2019 6:12:10 PM By: Levan Hurst RN, BSN Entered By: Levan Hurst on 06/18/2019 16:10:00 -------------------------------------------------------------------------------- Foot Assessment Details Patient Name: Date of Service: Gina [name], Gina [name] 06/18/2019 2:45 PM Medical Record [id number] Patient Account Number: [id number] Date of Birth/Sex: Treating RN: Jun 28, 1960 (59 y.o. Female) Levan Hurst Primary Care Raquel Sayres: Apolonio Schneiders Other Clinician: Referring Jaquayla Hege: Treating Sharlon Pfohl/Extender:Stone III, Jesus Genera, ANDREW Weeks in Treatment: 0 Foot Assessment Items [x]  Unable to perform right foot assessment due to amputation [x]  Unable to perform left foot assessment due to amputation Site Locations + = Sensation present, - = Sensation absent, C = Callus, U = Ulcer R = Redness, W = Warmth, M = Maceration, PU = Pre-ulcerative lesion F = Fissure, S = Swelling, D = Dryness Assessment Right: Left: Other Deformity: Prior Foot  Ulcer: Prior Amputation: Charcot Joint: Ambulatory Status: Non-ambulatory Assistance Device: Wheelchair Gait: Architect) Signed: 06/23/2019 6:12:10 PM By: Levan Hurst RN, BSN Entered By: Levan Hurst on 06/18/2019 16:10:25 -------------------------------------------------------------------------------- Nutrition Risk Screening Details Patient Name: Date of Service: Gina [name], Gina [name] 06/18/2019 2:45 PM Medical Record [id number] Patient Account Number: [id number] Date of Birth/Sex: Treating RN: Apr 06, 1960 (59 y.o. Female) Levan Hurst Primary Care Detrice Cales: Apolonio Schneiders Other Clinician: Referring Bart Ashford: Treating Melicia Esqueda/Extender:Stone III, Jesus Genera, ANDREW Weeks in Treatment: 0 Height (in): 62 Weight (lbs): 110 Body Mass Index (BMI): 20.1 Nutrition Risk Screening Items Score Screening NUTRITION RISK SCREEN: I have an illness or condition that made me change the kind and/or 2 Yes amount of food I eat I eat fewer than two meals per day 0 No I eat few fruits and vegetables, or milk products 0 No I have three or more drinks of beer, liquor or wine almost every day 0 No I have tooth or mouth problems that make it hard for me to eat 0 No I don't always have enough money to buy the food I need 0 No I eat alone most of the time 0 No I take three or more different prescribed or over-the-counter drugs a day 1 Yes 0 No Without wanting to, I have lost or gained 10 pounds in the last six months I am not always physically able to shop, cook and/or feed myself 2 Yes Nutrition Protocols Good Risk Protocol Provide education on Moderate Risk Protocol 0 nutrition High Risk Proctocol Risk Level: Moderate Risk Score: 5 Electronic Signature(s) Signed: 06/23/2019 6:12:10 PM By: Levan Hurst RN, BSN Entered By: Levan Hurst on 06/18/2019 16:10:11

## 2019-06-23 NOTE — Progress Notes (Signed)
[facility] Consult Note Telephone: [phone]  Fax: [phone]  PATIENT NAME: Gina [name] DOB: April 18, 1960 MRN: [id number]  PRIMARY CARE PROVIDER:   [name], MD  REFERRING PROVIDER:  [name], MD [place],  [place] [address]  RESPONSIBLE PARTYNatalija, Mavis Daughter [phone]     RECOMMENDATIONS and PLAN:   Palliative care encounter Z51.5  1.  Advance care planning:  Reviewed Palliative and Hospice care to patient and daughter.  Advanced directives reviewed.  DNR in place at home.  MOST reviewed with selections of limited interventions (no intubation, antibiotics and IV therapies if indicated.  Pt. Is unsure about feeding tube placement. Daughter is primary caregiver but no HCPOA in place.  Mail MOST and HCPOA document for review.  Palliative care will follow up with patient.   2.  Protein calorie malnutrition:  Current TPN recipient.  Soft diet as tolerated.  Encourage hydration.   3. Sacral wound:  Needs home health wound management. Discuss with hospital liaisons to assist with attaining home health care.    I spent 60 minutes providing this consultation,  from  to . More than 50% of the time in this consultation was spent coordinating communication.   HISTORY OF PRESENT ILLNESS:  Gina [name] is a 59 y.o. year old female with multiple medical problems including diabetes, sacral decubitus with sepsis related to a PICC line, bilat. Below knee amputee.  She is receiving TPN due to severe protein calorie malnutrion.  Daughter assists with all ADLS for pt.lon along with dressigng changes and  TPN IV line changes. Thus far, home health care has not been available to assist patient with her immediate needs.  At this time, she is bedriddend. Palliative Care was asked to help address goals of care.   CODE STATUS: DNAR/DNI  PPS: 30% HOSPICE ELIGIBILITY/DIAGNOSIS: TBD  PAST MEDICAL HISTORY:  Past  Medical History:  Diagnosis Date  . Diabetes mellitus   . Hypertension   . Osteomyelitis of right foot ([place]) 02/22/2017  . Perforated gastric ulcer ([place])   . S/P bilateral BKA (below knee amputation) ([place])   . Schizophrenia ([place])   . Schizophrenia ([place])   . Septic arthritis of interphalangeal joint of toe of left foot ([place]) 06/02/2016  . Stress incontinence 04/26/2017    SOCIAL HX: Lives with caregiver daughter  PERTINENT MEDICATIONS:  Outpatient Encounter Medications as of 06/20/2019  Medication Sig  . amLODipine (NORVASC) 5 MG tablet Take 1 tablet (5 mg total) by mouth daily.  [name] ascorbic acid (VITAMIN C) 500 MG tablet Take 1 tablet (500 mg total) by mouth 2 (two) times daily.  [name] escitalopram (LEXAPRO) 10 MG tablet Take 1 tablet (10 mg total) by mouth daily.  [name] gabapentin (NEURONTIN) 300 MG capsule Take 1 capsule (300 mg total) by mouth 2 (two) times daily.  [name] loperamide (IMODIUM) 2 MG capsule Take 1 capsule (2 mg total) by mouth every 8 (eight) hours.  . melatonin 3 MG TABS tablet Take 1 tablet (3 mg total) by mouth at bedtime.  . metoprolol tartrate (LOPRESSOR) 50 MG tablet Take 1 tablet (50 mg total) by mouth 2 (two) times daily.  . ondansetron (ZOFRAN) 4 MG tablet Take 1 tablet (4 mg total) by mouth every 6 (six) hours as needed for nausea.  [name] oxyCODONE (OXY IR/ROXICODONE) 5 MG immediate release tablet Take 1 tablet (5 mg total) by mouth every 6 (six) hours as needed (or pain).  [name]  pantoprazole (PROTONIX) 40 MG tablet Take 1 tablet (40 mg total) by mouth daily.  . simethicone (MYLICON) 80 MG chewable tablet Chew 80 mg by mouth 4 (four) times daily.  . sodium chloride (OCEAN) 0.65 % SOLN nasal spray Place 1 spray into both nostrils every 4 (four) hours as needed (dry nasal membranes).   No facility-administered encounter medications on file as of 06/20/2019.    PHYSICAL EXAM:   General: NAD, Cardiovascular: unable to assess Pulmonary: speech is not forced Abdomen: colostomy  Extremities: Bilat below knee amputations Skin: reports of sacral decubitus but not visualized today Neurological: Alert and oriented. Weakness  Gonzella Lex, NP-C

## 2019-06-23 NOTE — ED Triage Notes (Signed)
Per [facility] pt from home for foley catheter leaking when they got her up from bed this morning. Pt has bilat BKAs.  Vitals 140/80, 98% 97.4temp, CBG 233.
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
## 2019-06-23 NOTE — Discharge Instructions (Addendum)
We will call you if your culture indicate you need further treatment.  Return to the emergency department for any new and/or concerning symptoms.

## 2019-06-23 NOTE — ED Notes (Signed)
[facility] called for pt transfer home

## 2019-06-23 NOTE — ED Notes (Signed)
Foley catheter replaced with assistance of NT, perineal area cleaned to best of our ability due to immense amount of barrier cream in the area. Urine returned, 20 ml, bag securement leg strap in place and skin prep utilized

## 2019-06-24 NOTE — ED Notes (Signed)
Pt repositioned for comfort

## 2019-06-24 NOTE — ED Notes (Signed)
Patient repositioned on to right side per request

## 2019-06-26 ENCOUNTER — Encounter (HOSPITAL_BASED_OUTPATIENT_CLINIC_OR_DEPARTMENT_OTHER): Payer: Medicare Other | Attending: Internal Medicine | Admitting: Internal Medicine

## 2019-06-26 ENCOUNTER — Encounter (HOSPITAL_BASED_OUTPATIENT_CLINIC_OR_DEPARTMENT_OTHER): Payer: Medicare Other | Admitting: Internal Medicine

## 2019-06-26 LAB — URINE CULTURE

## 2019-06-27 ENCOUNTER — Emergency Department (HOSPITAL_COMMUNITY)
Admission: EM | Admit: 2019-06-27 | Discharge: 2019-06-28 | Disposition: A | Discharge status: Home / Self Care | Payer: Medicare Other | Attending: Emergency Medicine | Admitting: Emergency Medicine

## 2019-06-27 ENCOUNTER — Other Ambulatory Visit: Payer: Self-pay

## 2019-06-27 ENCOUNTER — Encounter (HOSPITAL_COMMUNITY): Payer: Self-pay | Admitting: Emergency Medicine

## 2019-06-27 DIAGNOSIS — Z79899 Other long term (current) drug therapy: Secondary | ICD-10-CM | POA: Diagnosis not present

## 2019-06-27 DIAGNOSIS — I10 Essential (primary) hypertension: Secondary | ICD-10-CM | POA: Diagnosis not present

## 2019-06-27 DIAGNOSIS — E119 Type 2 diabetes mellitus without complications: Secondary | ICD-10-CM | POA: Diagnosis not present

## 2019-06-27 DIAGNOSIS — T83031A Leakage of indwelling urethral catheter, initial encounter: Secondary | ICD-10-CM | POA: Insufficient documentation

## 2019-06-27 DIAGNOSIS — Z436 Encounter for attention to other artificial openings of urinary tract: Secondary | ICD-10-CM | POA: Diagnosis present

## 2019-06-27 DIAGNOSIS — R102 Pelvic and perineal pain: Secondary | ICD-10-CM | POA: Diagnosis not present

## 2019-06-27 DIAGNOSIS — Y828 Other medical devices associated with adverse incidents: Secondary | ICD-10-CM | POA: Insufficient documentation

## 2019-06-27 DIAGNOSIS — R300 Dysuria: Secondary | ICD-10-CM | POA: Insufficient documentation

## 2019-06-27 DIAGNOSIS — T839XXA Unspecified complication of genitourinary prosthetic device, implant and graft, initial encounter: Secondary | ICD-10-CM

## 2019-06-27 NOTE — ED Notes (Signed)
[facility] called for transport.  

## 2019-06-27 NOTE — ED Notes (Signed)
Chuck pad placed under patient. Catheter balloon deflated, noted 10 mL of fluid, and re-inflated with 10 mL of fluid. Irrigated catheter and no drainage noted on new chuck pad, no leaking visualized.
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
## 2019-06-27 NOTE — ED Triage Notes (Signed)
Arrived via EMS from home. Family noticed leaking from her catheter bag with each urination. Patient is requesting a thigh bag. Patient has wound on vaginal area, takes antibiotics for this. Vitals are stable.

## 2019-06-27 NOTE — Discharge Instructions (Addendum)
A leg bag was given and a new catheter was placed - it seems to be draining normally If you continue to have problems with urine draining around the catheter please make an appointment with urology for them to evaluate it (Dr. Junious Silk)

## 2019-06-28 ENCOUNTER — Other Ambulatory Visit: Payer: Self-pay

## 2019-06-28 ENCOUNTER — Other Ambulatory Visit (HOSPITAL_COMMUNITY)
Admission: AD | Admit: 2019-06-28 | Discharge: 2019-06-28 | Disposition: A | Discharge status: Home / Self Care | Payer: Medicare Other | Attending: Surgery | Admitting: Surgery

## 2019-06-28 ENCOUNTER — Emergency Department (HOSPITAL_COMMUNITY)
Admission: EM | Admit: 2019-06-28 | Discharge: 2019-06-29 | Disposition: A | Discharge status: Home / Self Care | Payer: Medicare Other | Source: Home / Self Care | Attending: Emergency Medicine | Admitting: Emergency Medicine

## 2019-06-28 ENCOUNTER — Encounter (HOSPITAL_COMMUNITY): Payer: Self-pay

## 2019-06-28 DIAGNOSIS — T83031A Leakage of indwelling urethral catheter, initial encounter: Secondary | ICD-10-CM | POA: Diagnosis not present

## 2019-06-28 DIAGNOSIS — D649 Anemia, unspecified: Secondary | ICD-10-CM

## 2019-06-28 LAB — CBC WITH DIFFERENTIAL/PLATELET
Abs Immature Granulocytes: 0.02 10*3/uL (ref 0.00–0.07)
Abs Immature Granulocytes: 0.01 10*3/uL (ref 0.00–0.07)
Basophils Absolute: 0 10*3/uL (ref 0.0–0.1)
Basophils Absolute: 0 10*3/uL (ref 0.0–0.1)
Basophils Relative: 0 %
Basophils Relative: 1 %
Eosinophils Absolute: 0.1 10*3/uL (ref 0.0–0.5)
Eosinophils Absolute: 0.1 10*3/uL (ref 0.0–0.5)
Eosinophils Relative: 2 %
Eosinophils Relative: 2 %
HCT: 21.8 % — ABNORMAL LOW (ref 36.0–46.0)
HCT: 33.8 % — ABNORMAL LOW (ref 36.0–46.0)
Hemoglobin: 6.7 g/dL — CL (ref 12.0–15.0)
Hemoglobin: 10.5 g/dL — ABNORMAL LOW (ref 12.0–15.0)
Immature Granulocytes: 0 %
Immature Granulocytes: 0 %
Lymphocytes Relative: 20 %
Lymphocytes Relative: 30 %
Lymphs Abs: 1.5 10*3/uL (ref 0.7–4.0)
Lymphs Abs: 1.3 10*3/uL (ref 0.7–4.0)
MCH: 29.5 pg (ref 26.0–34.0)
MCH: 29.1 pg (ref 26.0–34.0)
MCHC: 30.7 g/dL (ref 30.0–36.0)
MCHC: 31.1 g/dL (ref 30.0–36.0)
MCV: 96 fL (ref 80.0–100.0)
MCV: 93.6 fL (ref 80.0–100.0)
Monocytes Absolute: 0.6 10*3/uL (ref 0.1–1.0)
Monocytes Absolute: 0.5 10*3/uL (ref 0.1–1.0)
Monocytes Relative: 9 %
Monocytes Relative: 11 %
Neutro Abs: 5 10*3/uL (ref 1.7–7.7)
Neutro Abs: 2.4 10*3/uL (ref 1.7–7.7)
Neutrophils Relative %: 69 %
Neutrophils Relative %: 56 %
Platelets: 379 10*3/uL (ref 150–400)
Platelets: 273 10*3/uL (ref 150–400)
RBC: 2.27 MIL/uL — ABNORMAL LOW (ref 3.87–5.11)
RBC: 3.61 MIL/uL — ABNORMAL LOW (ref 3.87–5.11)
RDW: 15.8 % — ABNORMAL HIGH (ref 11.5–15.5)
RDW: 15.6 % — ABNORMAL HIGH (ref 11.5–15.5)
WBC: 7.3 10*3/uL (ref 4.0–10.5)
WBC: 4.2 10*3/uL (ref 4.0–10.5)
nRBC: 0 % (ref 0.0–0.2)
nRBC: 0 % (ref 0.0–0.2)

## 2019-06-28 LAB — COMPREHENSIVE METABOLIC PANEL
ALT: 15 U/L (ref 0–44)
AST: 20 U/L (ref 15–41)
Albumin: 2.1 g/dL — ABNORMAL LOW (ref 3.5–5.0)
Alkaline Phosphatase: 84 U/L (ref 38–126)
Anion gap: 8 (ref 5–15)
BUN: 36 mg/dL — ABNORMAL HIGH (ref 6–20)
CO2: 25 mmol/L (ref 22–32)
Calcium: 8.3 mg/dL — ABNORMAL LOW (ref 8.9–10.3)
Chloride: 103 mmol/L (ref 98–111)
Creatinine, Ser: 0.65 mg/dL (ref 0.44–1.00)
GFR calc Af Amer: >60 mL/min (ref >60)
GFR calc non Af Amer: >60 mL/min (ref >60)
Glucose, Bld: 246 mg/dL — ABNORMAL HIGH (ref 70–99)
Potassium: 4 mmol/L (ref 3.5–5.1)
Sodium: 136 mmol/L (ref 135–145)
Total Bilirubin: 0.5 mg/dL (ref 0.3–1.2)
Total Protein: 7 g/dL (ref 6.5–8.1)

## 2019-06-28 LAB — BASIC METABOLIC PANEL
Anion gap: 7 (ref 5–15)
BUN: 44 mg/dL — ABNORMAL HIGH (ref 6–20)
CO2: 26 mmol/L (ref 22–32)
Calcium: 8.3 mg/dL — ABNORMAL LOW (ref 8.9–10.3)
Chloride: 105 mmol/L (ref 98–111)
Creatinine, Ser: 0.73 mg/dL (ref 0.44–1.00)
GFR calc Af Amer: >60 mL/min (ref >60)
GFR calc non Af Amer: >60 mL/min (ref >60)
Glucose, Bld: 190 mg/dL — ABNORMAL HIGH (ref 70–99)
Potassium: 4.2 mmol/L (ref 3.5–5.1)
Sodium: 138 mmol/L (ref 135–145)

## 2019-06-28 LAB — TYPE AND SCREEN
ABO/RH(D): O POS
Antibody Screen: NEGATIVE

## 2019-06-28 LAB — PHOSPHORUS: Phosphorus: 3.9 mg/dL (ref 2.5–4.6)

## 2019-06-28 NOTE — ED Triage Notes (Signed)
Pt bib [facility]. She was sent here for low hgb and an infusion.  Pt hx of anemia and ulcer in small intestine. Bilat BKA and bedsores. V/S. cbg 312. Pt has no complaint of pain.

## 2019-06-28 NOTE — ED Notes (Signed)
Brief and gown changed. [facility] here for transport home. Pt ready for d/c

## 2019-06-28 NOTE — ED Notes (Signed)
Pt lying in bed, eye's closed, chest rising and falling. Will continue to monitor.
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
## 2019-06-28 NOTE — ED Notes (Signed)
Pt lying in bed waiting on transport home. Spoke with [facility] and pt should be next on the list. Pt updated on plan of care. Pt denies any needs. Will continue to monitor.

## 2019-06-29 DIAGNOSIS — T83031A Leakage of indwelling urethral catheter, initial encounter: Secondary | ICD-10-CM | POA: Diagnosis not present

## 2019-06-29 LAB — HEMOGLOBIN A1C
Hgb A1c MFr Bld: 6.1 % — ABNORMAL HIGH (ref 4.8–5.6)
Mean Plasma Glucose: 128.37 mg/dL

## 2019-06-29 LAB — HEMOGLOBIN AND HEMATOCRIT, BLOOD
HCT: 21.6 % — ABNORMAL LOW (ref 36.0–46.0)
HCT: 23.4 % — ABNORMAL LOW (ref 36.0–46.0)
Hemoglobin: 6.8 g/dL — CL (ref 12.0–15.0)
Hemoglobin: 7.4 g/dL — ABNORMAL LOW (ref 12.0–15.0)

## 2019-06-29 MED ORDER — SODIUM CHLORIDE 0.9 % IV SOLN: 10.0000 mL/h | Freq: Once | INTRAVENOUS | Status: DC

## 2019-06-29 MED ORDER — OXYCODONE-ACETAMINOPHEN 5-325 MG PO TABS
1.0000 | ORAL_TABLET | Freq: Once | ORAL | Status: AC
  Administered 2019-06-29: 1 via ORAL
  Filled 2019-06-29: qty 1

## 2019-06-29 NOTE — ED Notes (Signed)
Pt verbalized understanding of d/c instructions, follow up care and s/s requiring return to ed. Pt transported home via [facility]

## 2019-06-29 NOTE — Discharge Instructions (Signed)
You were seen today for concerns for anemia.  After multiple rechecks, I do not believe you need transfusion and are likely at your baseline.  Follow-up closely with your primary doctor for recheck.  Your blood pressure was slightly low but you were asymptomatic.  Again, follow-up for recheck closely.

## 2019-06-30 LAB — CEA: CEA: 1.2 ng/mL (ref 0.0–4.7)

## 2019-06-30 LAB — CALCIUM, IONIZED: Calcium, Ionized, Serum: 5.4 mg/dL (ref 4.5–5.6)

## 2019-07-03 ENCOUNTER — Other Ambulatory Visit (HOSPITAL_COMMUNITY)
Admission: RE | Admit: 2019-07-03 | Discharge: 2019-07-03 | Disposition: A | Discharge status: Home / Self Care | Payer: Medicare Other | Source: Skilled Nursing Facility | Attending: Internal Medicine | Admitting: Internal Medicine

## 2019-07-03 DIAGNOSIS — K264 Chronic or unspecified duodenal ulcer with hemorrhage: Secondary | ICD-10-CM | POA: Diagnosis not present

## 2019-07-03 DIAGNOSIS — R112 Nausea with vomiting, unspecified: Secondary | ICD-10-CM | POA: Diagnosis not present

## 2019-07-03 LAB — COMPREHENSIVE METABOLIC PANEL
ALT: 14 U/L (ref 0–44)
AST: 18 U/L (ref 15–41)
Albumin: 2.2 g/dL — ABNORMAL LOW (ref 3.5–5.0)
Alkaline Phosphatase: 76 U/L (ref 38–126)
Anion gap: 6 (ref 5–15)
BUN: 34 mg/dL — ABNORMAL HIGH (ref 6–20)
CO2: 26 mmol/L (ref 22–32)
Calcium: 8.6 mg/dL — ABNORMAL LOW (ref 8.9–10.3)
Chloride: 102 mmol/L (ref 98–111)
Creatinine, Ser: 0.7 mg/dL (ref 0.44–1.00)
GFR calc Af Amer: >60 mL/min (ref >60)
GFR calc non Af Amer: >60 mL/min (ref >60)
Glucose, Bld: 124 mg/dL — ABNORMAL HIGH (ref 70–99)
Potassium: 3.9 mmol/L (ref 3.5–5.1)
Sodium: 134 mmol/L — ABNORMAL LOW (ref 135–145)
Total Bilirubin: 0.4 mg/dL (ref 0.3–1.2)
Total Protein: 7 g/dL (ref 6.5–8.1)

## 2019-07-03 LAB — MAGNESIUM: Magnesium: 1.7 mg/dL (ref 1.7–2.4)

## 2019-07-03 LAB — PHOSPHORUS: Phosphorus: 4.1 mg/dL (ref 2.5–4.6)

## 2019-07-04 ENCOUNTER — Encounter (HOSPITAL_COMMUNITY): Payer: Self-pay

## 2019-07-04 ENCOUNTER — Inpatient Hospital Stay (HOSPITAL_COMMUNITY)
Admission: EM | Admit: 2019-07-04 | Discharge: 2019-07-08 | DRG: 377 | Disposition: A | Discharge status: Home / Self Care | Payer: Medicare Other | Attending: Internal Medicine | Admitting: Internal Medicine

## 2019-07-04 ENCOUNTER — Other Ambulatory Visit: Payer: Self-pay

## 2019-07-04 ENCOUNTER — Emergency Department (HOSPITAL_COMMUNITY): Payer: Medicare Other

## 2019-07-04 DIAGNOSIS — E43 Unspecified severe protein-calorie malnutrition: Secondary | ICD-10-CM | POA: Diagnosis present

## 2019-07-04 DIAGNOSIS — Z833 Family history of diabetes mellitus: Secondary | ICD-10-CM | POA: Diagnosis not present

## 2019-07-04 DIAGNOSIS — F209 Schizophrenia, unspecified: Secondary | ICD-10-CM | POA: Diagnosis present

## 2019-07-04 DIAGNOSIS — E1169 Type 2 diabetes mellitus with other specified complication: Secondary | ICD-10-CM | POA: Diagnosis present

## 2019-07-04 DIAGNOSIS — Z89511 Acquired absence of right leg below knee: Secondary | ICD-10-CM | POA: Diagnosis not present

## 2019-07-04 DIAGNOSIS — Z8614 Personal history of Methicillin resistant Staphylococcus aureus infection: Secondary | ICD-10-CM | POA: Diagnosis not present

## 2019-07-04 DIAGNOSIS — Z8619 Personal history of other infectious and parasitic diseases: Secondary | ICD-10-CM

## 2019-07-04 DIAGNOSIS — Z91030 Bee allergy status: Secondary | ICD-10-CM

## 2019-07-04 DIAGNOSIS — K264 Chronic or unspecified duodenal ulcer with hemorrhage: Secondary | ICD-10-CM | POA: Diagnosis present

## 2019-07-04 DIAGNOSIS — Z89512 Acquired absence of left leg below knee: Secondary | ICD-10-CM | POA: Diagnosis not present

## 2019-07-04 DIAGNOSIS — Z20822 Contact with and (suspected) exposure to covid-19: Secondary | ICD-10-CM | POA: Diagnosis present

## 2019-07-04 DIAGNOSIS — K632 Fistula of intestine: Secondary | ICD-10-CM | POA: Diagnosis present

## 2019-07-04 DIAGNOSIS — R195 Other fecal abnormalities: Secondary | ICD-10-CM | POA: Diagnosis present

## 2019-07-04 DIAGNOSIS — L309 Dermatitis, unspecified: Secondary | ICD-10-CM | POA: Diagnosis present

## 2019-07-04 DIAGNOSIS — L89159 Pressure ulcer of sacral region, unspecified stage: Secondary | ICD-10-CM | POA: Diagnosis present

## 2019-07-04 DIAGNOSIS — D649 Anemia, unspecified: Secondary | ICD-10-CM

## 2019-07-04 DIAGNOSIS — Z79899 Other long term (current) drug therapy: Secondary | ICD-10-CM | POA: Diagnosis not present

## 2019-07-04 DIAGNOSIS — Z8042 Family history of malignant neoplasm of prostate: Secondary | ICD-10-CM | POA: Diagnosis not present

## 2019-07-04 DIAGNOSIS — K8020 Calculus of gallbladder without cholecystitis without obstruction: Secondary | ICD-10-CM | POA: Diagnosis present

## 2019-07-04 DIAGNOSIS — Z89519 Acquired absence of unspecified leg below knee: Secondary | ICD-10-CM

## 2019-07-04 DIAGNOSIS — D500 Iron deficiency anemia secondary to blood loss (chronic): Principal | ICD-10-CM | POA: Diagnosis present

## 2019-07-04 DIAGNOSIS — Z79891 Long term (current) use of opiate analgesic: Secondary | ICD-10-CM

## 2019-07-04 DIAGNOSIS — Z66 Do not resuscitate: Secondary | ICD-10-CM | POA: Diagnosis present

## 2019-07-04 DIAGNOSIS — I10 Essential (primary) hypertension: Secondary | ICD-10-CM | POA: Diagnosis present

## 2019-07-04 DIAGNOSIS — Z888 Allergy status to other drugs, medicaments and biological substances status: Secondary | ICD-10-CM

## 2019-07-04 DIAGNOSIS — E1159 Type 2 diabetes mellitus with other circulatory complications: Secondary | ICD-10-CM

## 2019-07-04 DIAGNOSIS — Z8249 Family history of ischemic heart disease and other diseases of the circulatory system: Secondary | ICD-10-CM | POA: Diagnosis not present

## 2019-07-04 DIAGNOSIS — R112 Nausea with vomiting, unspecified: Secondary | ICD-10-CM | POA: Diagnosis present

## 2019-07-04 DIAGNOSIS — Z95828 Presence of other vascular implants and grafts: Secondary | ICD-10-CM

## 2019-07-04 DIAGNOSIS — E119 Type 2 diabetes mellitus without complications: Secondary | ICD-10-CM

## 2019-07-04 LAB — CBC WITH DIFFERENTIAL/PLATELET
Abs Immature Granulocytes: 0.02 10*3/uL (ref 0.00–0.07)
Abs Immature Granulocytes: 0.02 10*3/uL (ref 0.00–0.07)
Basophils Absolute: 0 10*3/uL (ref 0.0–0.1)
Basophils Absolute: 0 10*3/uL (ref 0.0–0.1)
Basophils Relative: 0 %
Basophils Relative: 0 %
Eosinophils Absolute: 0.1 10*3/uL (ref 0.0–0.5)
Eosinophils Absolute: 0 10*3/uL (ref 0.0–0.5)
Eosinophils Relative: 2 %
Eosinophils Relative: 0 %
HCT: 21.2 % — ABNORMAL LOW (ref 36.0–46.0)
HCT: 21 % — ABNORMAL LOW (ref 36.0–46.0)
Hemoglobin: 6.7 g/dL — CL (ref 12.0–15.0)
Hemoglobin: 6.6 g/dL — CL (ref 12.0–15.0)
Immature Granulocytes: 0 %
Immature Granulocytes: 0 %
Lymphocytes Relative: 37 %
Lymphocytes Relative: 15 %
Lymphs Abs: 2.2 10*3/uL (ref 0.7–4.0)
Lymphs Abs: 1.4 10*3/uL (ref 0.7–4.0)
MCH: 30 pg (ref 26.0–34.0)
MCH: 30.3 pg (ref 26.0–34.0)
MCHC: 31.6 g/dL (ref 30.0–36.0)
MCHC: 31.4 g/dL (ref 30.0–36.0)
MCV: 95.1 fL (ref 80.0–100.0)
MCV: 96.3 fL (ref 80.0–100.0)
Monocytes Absolute: 0.7 10*3/uL (ref 0.1–1.0)
Monocytes Absolute: 0.9 10*3/uL (ref 0.1–1.0)
Monocytes Relative: 12 %
Monocytes Relative: 9 %
Neutro Abs: 2.8 10*3/uL (ref 1.7–7.7)
Neutro Abs: 7.3 10*3/uL (ref 1.7–7.7)
Neutrophils Relative %: 49 %
Neutrophils Relative %: 76 %
Platelets: 327 10*3/uL (ref 150–400)
Platelets: 300 10*3/uL (ref 150–400)
RBC: 2.23 MIL/uL — ABNORMAL LOW (ref 3.87–5.11)
RBC: 2.18 MIL/uL — ABNORMAL LOW (ref 3.87–5.11)
RDW: 15.7 % — ABNORMAL HIGH (ref 11.5–15.5)
RDW: 15.6 % — ABNORMAL HIGH (ref 11.5–15.5)
WBC: 5.9 10*3/uL (ref 4.0–10.5)
WBC: 9.6 10*3/uL (ref 4.0–10.5)
nRBC: 0 % (ref 0.0–0.2)
nRBC: 0 % (ref 0.0–0.2)

## 2019-07-04 LAB — URINALYSIS, ROUTINE W REFLEX MICROSCOPIC
Bilirubin Urine: NEGATIVE
Glucose, UA: NEGATIVE mg/dL
Ketones, ur: NEGATIVE mg/dL
Nitrite: NEGATIVE
Protein, ur: >=300 mg/dL — AB
Specific Gravity, Urine: 1.016 (ref 1.005–1.030)
WBC, UA: >50 WBC/hpf — ABNORMAL HIGH (ref 0–5)
pH: 7 (ref 5.0–8.0)

## 2019-07-04 LAB — PREPARE RBC (CROSSMATCH)

## 2019-07-04 LAB — COMPREHENSIVE METABOLIC PANEL
ALT: 13 U/L (ref 0–44)
AST: 15 U/L (ref 15–41)
Albumin: 2.5 g/dL — ABNORMAL LOW (ref 3.5–5.0)
Alkaline Phosphatase: 67 U/L (ref 38–126)
Anion gap: 8 (ref 5–15)
BUN: 43 mg/dL — ABNORMAL HIGH (ref 6–20)
CO2: 25 mmol/L (ref 22–32)
Calcium: 8.8 mg/dL — ABNORMAL LOW (ref 8.9–10.3)
Chloride: 106 mmol/L (ref 98–111)
Creatinine, Ser: 0.91 mg/dL (ref 0.44–1.00)
GFR calc Af Amer: >60 mL/min (ref >60)
GFR calc non Af Amer: >60 mL/min (ref >60)
Glucose, Bld: 135 mg/dL — ABNORMAL HIGH (ref 70–99)
Potassium: 4 mmol/L (ref 3.5–5.1)
Sodium: 139 mmol/L (ref 135–145)
Total Bilirubin: 0.5 mg/dL (ref 0.3–1.2)
Total Protein: 7.6 g/dL (ref 6.5–8.1)

## 2019-07-04 LAB — POC OCCULT BLOOD, ED: Fecal Occult Bld: POSITIVE — AB

## 2019-07-04 LAB — RESPIRATORY PANEL BY RT PCR (FLU A&B, COVID)
Influenza A by PCR: NEGATIVE
Influenza B by PCR: NEGATIVE
SARS Coronavirus 2 by RT PCR: NEGATIVE

## 2019-07-04 MED ORDER — INSULIN ASPART 100 UNIT/ML ~~LOC~~ SOLN
0.0000 [IU] | Freq: Every day | SUBCUTANEOUS | Status: DC
  Filled 2019-07-04: qty 0.05

## 2019-07-04 MED ORDER — INSULIN ASPART 100 UNIT/ML ~~LOC~~ SOLN
0.0000 [IU] | Freq: Three times a day (TID) | SUBCUTANEOUS | Status: DC
  Filled 2019-07-04: qty 0.09

## 2019-07-04 MED ORDER — SODIUM CHLORIDE 0.9% IV SOLUTION: Freq: Once | INTRAVENOUS | Status: DC

## 2019-07-04 MED ORDER — SODIUM CHLORIDE 0.9 % IV SOLN
8.0000 mg/h | INTRAVENOUS | Status: DC
  Administered 2019-07-04 – 2019-07-06 (×4): 8 mg/h via INTRAVENOUS
  Filled 2019-07-04 (×4): qty 80

## 2019-07-04 MED ORDER — SODIUM CHLORIDE 0.9 % IV SOLN
80.0000 mg | Freq: Once | INTRAVENOUS | Status: AC
  Administered 2019-07-04: 80 mg via INTRAVENOUS
  Filled 2019-07-04: qty 80

## 2019-07-04 MED ORDER — ONDANSETRON HCL 4 MG PO TABS
4.0000 mg | ORAL_TABLET | Freq: Four times a day (QID) | ORAL | Status: DC | PRN
  Filled 2019-07-04: qty 1

## 2019-07-04 MED ORDER — PANTOPRAZOLE SODIUM 40 MG IV SOLR: 40.0000 mg | Freq: Two times a day (BID) | INTRAVENOUS | Status: DC

## 2019-07-04 MED ORDER — ONDANSETRON HCL 4 MG/2ML IJ SOLN
4.0000 mg | Freq: Four times a day (QID) | INTRAMUSCULAR | Status: DC | PRN
  Administered 2019-07-04 – 2019-07-06 (×4): 4 mg via INTRAVENOUS
  Filled 2019-07-04 (×4): qty 2

## 2019-07-04 MED ORDER — SODIUM CHLORIDE 0.9 % IV SOLN
10.0000 mL/h | Freq: Once | INTRAVENOUS | Status: AC
  Administered 2019-07-04: 10 mL/h via INTRAVENOUS

## 2019-07-04 MED ORDER — SODIUM CHLORIDE 0.9 % IV SOLN: INTRAVENOUS | Status: DC

## 2019-07-04 MED ORDER — MORPHINE SULFATE (PF) 2 MG/ML IV SOLN
2.0000 mg | INTRAVENOUS | Status: DC | PRN
  Administered 2019-07-04 – 2019-07-08 (×6): 2 mg via INTRAVENOUS
  Filled 2019-07-04 (×6): qty 1

## 2019-07-04 NOTE — ED Notes (Signed)
Upon entering room to meet patient, pt advised that she had had a BM.  RN Meghan B assisted me with cleaning pt and replacing sacral pad and brief. Pt was also changed into a hospital gown with her belongings placed in pt belonging bag at bedside. Pt repositioned to her right side and call light and cell phone are in reach.

## 2019-07-04 NOTE — H&P (Signed)
History and Physical   Gina [name] RCV:[id number] DOB: 1960/06/25 DOA: 07/04/2019  Referring MD/NP/PA: Dr. Kathrynn Humble  PCP: [name], MD   Outpatient Specialists: None  Patient coming from: Home  Chief Complaint: Nausea vomiting or weakness  HPI: Gina [name] is a 59 y.o. female with medical history significant of diabetes, hypertension, bilateral BKA, perforated gastric ulcer, osteomyelitis, schizophrenia, recurrent DKA's, history of peritonsillar abscess, history of MRSA bacteremia who presented to the ER with significant abdominal pain and nausea.  She is having pain more in the epigastric region.  Patient was noted to have drop her hemoglobin currently is 6.6.  She is still guaiac positive.  She was recently admitted with MRSA bacteremia back in March of this year.  Prior to that she did have acute peritonitis with exploratory laparotomy done with [name] patch in November of last year.  After that she had Candida glabrata blood cultures and peritoneal fluid also showing Candida.  She had IR drainage.  Patient also has previous perforated peptic ulcer that led to septic shock.  Today however she is having significant anemia that appears to be due to upper GI bleed.  She is being admitted to the hospital for further work-up..  ED Course: Temperature is 99 blood pressure 103/77 pulse 94 respirate 22 oxygen sat 94% room air.  White count 9.6 hemoglobin 6.6 and platelet 300.  Sodium 139 potassium 4.0 chloride 106 CO2 25 BUN 43 creatinine 0.91 calcium 8.8.  COVID-19 is negative.  Stool guaiac positive.  Urinalysis showed large leukocytes.  Many bacteria.  RBC 21-50 and WBC more than 50.  Troponin possible is positive.  Chemistry appears to be within normal.  Chest x-ray showed right PICC line overlies superior vena cava.  Patient is being admitted with acute GI bleed.  Review of Systems: As per HPI otherwise 10 point review of systems negative.    Past Medical History:  Diagnosis Date    . Diabetes mellitus   . Hypertension   . Osteomyelitis of right foot ([place]) 02/22/2017  . Perforated gastric ulcer ([place])   . S/P bilateral BKA (below knee amputation) ([place])   . Schizophrenia ([place])   . Schizophrenia ([place])   . Septic arthritis of interphalangeal joint of toe of left foot ([place]) 06/02/2016  . Stress incontinence 04/26/2017    Past Surgical History:  Procedure Laterality Date  . AMPUTATION Left 06/05/2016   Procedure: LEFT FOOT TRANSMETATARSAL AMPUTATION;  Surgeon: [name], MD;  Location: [facility];  Service: Orthopedics;  Laterality: Left;  . AMPUTATION Right 11/11/2017   Procedure: AMPUTATION BELOW KNEE;  Surgeon: [name], MD;  Location: [place];  Service: Orthopedics;  Laterality: Right;  . AMPUTATION Left 05/10/2018   Procedure: LEFT BELOW KNEE AMPUTATION;  Surgeon: [name], MD;  Location: [place];  Service: Orthopedics;  Laterality: Left;  . AMPUTATION Left 12/28/2018   Procedure: AMPUTATION THIRD DIGIT;  Surgeon: Iran Planas, MD;  Location: [facility];  Service: Orthopedics;  Laterality: Left;  . AMPUTATION TOE Right 02/23/2017   Procedure: AMPUTATION RIGHT THIRD TOE;  Surgeon: [name], MD;  Location: [place];  Service: Orthopedics;  Laterality: Right;  . I & D EXTREMITY Right 11/09/2017   Procedure: Debride Ulcer Right Heel;  Surgeon: [name], MD;  Location: [place];  Service: Orthopedics;  Laterality: Right;  . I & D EXTREMITY Left 12/28/2018   Procedure: IRRIGATION AND DEBRIDEMENT EXTREMITY;  Surgeon: Iran Planas, MD;  Location: [facility];  Service: Orthopedics;  Laterality: Left;  . INCISION AND DRAINAGE OF WOUND Left 12/26/2018   Procedure: IRRIGATION AND DEBRIDEMENT LEFT HAND;  Surgeon: Iran Planas, MD;  Location: [facility];  Service: Orthopedics;  Laterality: Left;  . LAPAROTOMY N/A 02/02/2019   Procedure: EXPLORATORY LAPAROTOMY WITH OVERSEW OF DUODENAL ULCER;  Surgeon: Alphonsa Overall, MD;  Location: [facility];  Service: General;  Laterality: N/A;  .  TEE WITHOUT CARDIOVERSION N/A 05/27/2019   Procedure: TRANSESOPHAGEAL ECHOCARDIOGRAM (TEE);  Surgeon: Skeet Latch, MD;  Location: [place] [facility];  Service: Cardiovascular;  Laterality: N/A;  . TUBAL LIGATION       reports that she has never smoked. She has never used smokeless tobacco. She reports that she does not drink alcohol or use drugs.  Allergies  Allergen Reactions  . Bee Venom Swelling    Swells up with bee stings   . Metformin And Related Other (See Comments)    Upset stomach    Family History  Problem Relation Age of Onset  . Diabetes type II Father   . CAD Father   . Prostate cancer Father   . Diabetes Mellitus II Brother      Prior to Admission medications   Medication Sig Start Date End Date Taking? Authorizing Provider  amLODipine (NORVASC) 5 MG tablet Take 1 tablet (5 mg total) by mouth daily. 06/10/19 07/10/19 Yes Shelly Coss, MD  ascorbic acid (VITAMIN C) 500 MG tablet Take 1 tablet (500 mg total) by mouth 2 (two) times daily. 06/10/19  Yes Shelly Coss, MD  escitalopram (LEXAPRO) 10 MG tablet Take 1 tablet (10 mg total) by mouth daily. Patient taking differently: Take 20 mg by mouth daily.  06/10/19  Yes Shelly Coss, MD  gabapentin (NEURONTIN) 300 MG capsule Take 1 capsule (300 mg total) by mouth 2 (two) times daily. 06/10/19  Yes Shelly Coss, MD  loperamide (IMODIUM) 2 MG capsule Take 1 capsule (2 mg total) by mouth every 8 (eight) hours. 06/10/19  Yes Adhikari, Tamsen Meek, MD  melatonin 3 MG TABS tablet Take 1 tablet (3 mg total) by mouth at bedtime. 06/10/19  Yes Shelly Coss, MD  metoprolol tartrate (LOPRESSOR) 50 MG tablet Take 1 tablet (50 mg total) by mouth 2 (two) times daily. 06/10/19  Yes Shelly Coss, MD  Nutritional Supplements (ENSURE ENLIVE PO) Take 237 mLs by mouth 2 (two) times daily with a meal.   Yes [provider]  ondansetron (ZOFRAN) 4 MG tablet Take 1 tablet (4 mg total) by mouth every 6 (six) hours as needed for nausea.  06/10/19  Yes Shelly Coss, MD  oxyCODONE (OXY IR/ROXICODONE) 5 MG immediate release tablet Take 1 tablet (5 mg total) by mouth every 6 (six) hours as needed (or pain). 06/10/19  Yes Shelly Coss, MD  pantoprazole (PROTONIX) 40 MG tablet Take 1 tablet (40 mg total) by mouth daily. 06/10/19  Yes Shelly Coss, MD  perphenazine (TRILAFON) 2 MG tablet Take 2 mg by mouth 2 (two) times daily.   Yes [provider]  polyethylene glycol (MIRALAX / GLYCOLAX) 17 g packet Take 17 g by mouth daily.   Yes [provider]  sennosides-docusate sodium (SENOKOT-S) 8.6-50 MG tablet Take 2 tablets by mouth at bedtime as needed for constipation.   Yes [provider]  simethicone (MYLICON) 80 MG chewable tablet Chew 80 mg by mouth 4 (four) times daily.   Yes [provider]  sodium chloride (OCEAN) 0.65 % SOLN nasal spray Place 1 spray into both nostrils every 4 (four) hours as needed (  dry nasal membranes).   Yes [provider]    Physical Exam: Vitals:   07/04/19 1629 07/04/19 1630 07/04/19 1700 07/04/19 1800  BP:  112/60 106/77 103/77  Pulse:  90 90 89  Resp: 10 10 17  (!) 22  Temp:      TempSrc:      SpO2:  98% 100% 100%      Constitutional: Anxious, chronically ill looking, malnourished Vitals:   07/04/19 1629 07/04/19 1630 07/04/19 1700 07/04/19 1800  BP:  112/60 106/77 103/77  Pulse:  90 90 89  Resp: 10 10 17  (!) 22  Temp:      TempSrc:      SpO2:  98% 100% 100%   Eyes: PERRL, lids and conjunctivae normal ENMT: Mucous membranes are dry. Posterior pharynx clear of any exudate or lesions.edentulous Neck: normal, supple, no masses, no thyromegaly Respiratory: clear to auscultation bilaterally, no wheezing, no crackles. Normal respiratory effort. No accessory muscle use.  Cardiovascular: Regular rate and rhythm, no murmurs / rubs / gallops. No extremity edema. 2+ pedal pulses. No carotid bruits.  Abdomen: Mild epigastric tenderness no masses  palpated. No hepatosplenomegaly. Bowel sounds positive.  Musculoskeletal: no clubbing / cyanosis.  Bilateral BKA  skin: no rashes, lesions, ulcers. No induration Neurologic: CN 2-12 grossly intact. Sensation intact, DTR normal. Strength 5/5 in all 4.  Psychiatric: Normal judgment and insight. Alert and oriented x 3. Normal mood.     Labs on Admission: I have personally reviewed following labs and imaging studies  CBC: Recent Labs  Lab 06/28/19 1645 06/28/19 2300 06/29/19 0138 07/03/19 2050 07/04/19 1647  WBC 7.3 4.2  --  5.9 9.6  NEUTROABS 5.0 2.4  --  2.8 7.3  HGB 6.7* 10.5* 7.4* 6.7* 6.6*  HCT 21.8* 33.8* 23.4* 21.2* 21.0*  MCV 96.0 93.6  --  95.1 96.3  PLT 379 273  --  327 546   Basic Metabolic Panel: Recent Labs  Lab 06/28/19 1645 06/28/19 2300 07/03/19 2050 07/04/19 1647  NA 136 138 134* 139  K 4.0 4.2 3.9 4.0  CL 103 105 102 106  CO2 25 26 26 25   GLUCOSE 246* 190* 124* 135*  BUN 36* 44* 34* 43*  CREATININE 0.65 0.73 0.70 0.91  CALCIUM 8.3* 8.3* 8.6* 8.8*  MG  --   --  1.7  --   PHOS 3.9  --  4.1  --    GFR: Estimated Creatinine Clearance: 58 mL/min (by C-G formula based on SCr of 0.91 mg/dL). Liver Function Tests: Recent Labs  Lab 06/28/19 1645 07/03/19 2050 07/04/19 1647  AST 20 18 15   ALT 15 14 13   ALKPHOS 84 76 67  BILITOT 0.5 0.4 0.5  PROT 7.0 7.0 7.6  ALBUMIN 2.1* 2.2* 2.5*   No results for input(s): LIPASE, AMYLASE in the last 168 hours. No results for input(s): AMMONIA in the last 168 hours. Coagulation Profile: No results for input(s): INR, PROTIME in the last 168 hours. Cardiac Enzymes: No results for input(s): CKTOTAL, CKMB, CKMBINDEX, TROPONINI in the last 168 hours. BNP (last 3 results) No results for input(s): PROBNP in the last 8760 hours. HbA1C: No results for input(s): HGBA1C in the last 72 hours. CBG: No results for input(s): GLUCAP in the last 168 hours. Lipid Profile: No results for input(s): CHOL, HDL, LDLCALC, TRIG,  CHOLHDL, LDLDIRECT in the last 72 hours. Thyroid Function Tests: No results for input(s): TSH, T4TOTAL, FREET4, T3FREE, THYROIDAB in the last 72 hours. Anemia Panel: No results for  input(s): VITAMINB12, FOLATE, FERRITIN, TIBC, IRON, RETICCTPCT in the last 72 hours. Urine analysis:    Component Value Date/Time   COLORURINE YELLOW 06/23/2019 1820   APPEARANCEUR CLOUDY (A) 06/23/2019 1820   LABSPEC 1.013 06/23/2019 1820   PHURINE 8.0 06/23/2019 1820   GLUCOSEU NEGATIVE 06/23/2019 1820   HGBUR SMALL (A) 06/23/2019 1820   BILIRUBINUR NEGATIVE 06/23/2019 1820   BILIRUBINUR N 04/28/2016 1603   KETONESUR NEGATIVE 06/23/2019 1820   PROTEINUR 100 (A) 06/23/2019 1820   UROBILINOGEN 0.2 04/28/2016 1603   UROBILINOGEN 0.2 06/18/2013 0650   NITRITE NEGATIVE 06/23/2019 1820   LEUKOCYTESUR MODERATE (A) 06/23/2019 1820   Sepsis Labs: @LABRCNTIP (procalcitonin:4,lacticidven:4) )No results found for this or any previous visit (from the past 240 hour(s)).   Radiological Exams on Admission: DG Chest Port 1 View  Result Date: 07/04/2019 CLINICAL DATA:  Nausea and vomiting, anemia, PICC placement EXAM: PORTABLE CHEST 1 VIEW COMPARISON:  06/01/2019 FINDINGS: Single frontal view of the chest demonstrates right-sided PICC tip overlying superior vena cava. The cardiac silhouette is unremarkable. No airspace disease, effusion, or pneumothorax. No acute bony abnormalities. IMPRESSION: 1. Right PICC overlying superior vena cava. 2. No acute intrathoracic process. Electronically Signed   By: Randa Ngo M.D.   On: 07/04/2019 16:36      Assessment/Plan Principal Problem:   Symptomatic anemia Active Problems:   HTN (hypertension)   Diabetes ([place])   Osteomyelitis due to type 2 diabetes mellitus ([place])   Below-knee amputee ([place])     #1 symptomatic anemia: Secondary to GI bleed.  Patient will be admitted and serial's H&H will be initiated.  GI consultation.  IV Protonix.  We will keep her n.p.o. except  for sips.  Patient will be n.p.o. after midnight for possible EGD in the morning.  She has had previous peptic ulcer disease which could be a recurrence.  #2 hypertension: Medications will be on hold while GI work-up is completed.  #3 diabetes: Sliding scale insulin will be initiated.  #4 status post bilateral BKA: Stable.  #5 history of osteomyelitis: Patient has a PICC line in place.  Patient reportedly has completed vancomycin on March 25.   DVT prophylaxis: SCD  cannot be done due to bilateral NPT Code Status: DNR Family Communication: Daughter over the phone Disposition Plan: Home Consults called: Gastroenterology Dr. Alessandra Bevels Admission status: Inpatient  Severity of Illness: The appropriate patient status for this patient is INPATIENT. Inpatient status is judged to be reasonable and necessary in order to provide the required intensity of service to ensure the patient's safety. The patient's presenting symptoms, physical exam findings, and initial radiographic and laboratory data in the context of their chronic comorbidities is felt to place them at high risk for further clinical deterioration. Furthermore, it is not anticipated that the patient will be medically stable for discharge from the hospital within 2 midnights of admission. The following factors support the patient status of inpatient.   " The patient's presenting symptoms include abdominal pain and melena. " The worrisome physical exam findings include epigastric tenderness. " The initial radiographic and laboratory data are worrisome because of hemoglobin 6.6. " The chronic co-morbidities include diabetes.   * I certify that at the point of admission it is my clinical judgment that the patient will require inpatient hospital care spanning beyond 2 midnights from the point of admission due to high intensity of service, high risk for further deterioration and high frequency of surveillance required.[name] Merino  MD [facility] Pager [phone]  [phone]  If 7PM-7AM, please contact night-coverage [email or web address] Password [facility]  07/04/2019, 6:28 PM
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
## 2019-07-04 NOTE — ED Notes (Signed)
Delay on lab collection- waiting for confirmation of PICC line placement.

## 2019-07-04 NOTE — ED Notes (Signed)
Blood bank has 2 units of blood ready for this patient. Morey Hummingbird, RN informed.

## 2019-07-04 NOTE — ED Triage Notes (Addendum)
Pt BIBA from PCP office. -  Per EMS-  Pt c/o n/v and low hemoglobin.   Pt has PICC in right upper arm.   PCP gave 8 mg ODT zofran.   AOx4,

## 2019-07-04 NOTE — ED Notes (Signed)
Pt lying in bed. Denies any needs or s/s of transfusion reaction. Family at bedside. Will continue to monitor.

## 2019-07-04 NOTE — ED Notes (Signed)
Pt lying in bed, family at bedside. Pt asking for pain meds. Denies s/s of transfusion reaction. Will continue to monitor.

## 2019-07-04 NOTE — ED Notes (Signed)
Pt lying in bed eating. NAD noted. Family at bedside. Will continue to monitor.

## 2019-07-05 ENCOUNTER — Inpatient Hospital Stay (HOSPITAL_COMMUNITY): Payer: Medicare Other

## 2019-07-05 LAB — COMPREHENSIVE METABOLIC PANEL
ALT: 11 U/L (ref 0–44)
AST: 14 U/L — ABNORMAL LOW (ref 15–41)
Albumin: 2.3 g/dL — ABNORMAL LOW (ref 3.5–5.0)
Alkaline Phosphatase: 61 U/L (ref 38–126)
Anion gap: 8 (ref 5–15)
BUN: 40 mg/dL — ABNORMAL HIGH (ref 6–20)
CO2: 23 mmol/L (ref 22–32)
Calcium: 8.6 mg/dL — ABNORMAL LOW (ref 8.9–10.3)
Chloride: 107 mmol/L (ref 98–111)
Creatinine, Ser: 0.92 mg/dL (ref 0.44–1.00)
GFR calc Af Amer: >60 mL/min (ref >60)
GFR calc non Af Amer: >60 mL/min (ref >60)
Glucose, Bld: 98 mg/dL (ref 70–99)
Potassium: 4.3 mmol/L (ref 3.5–5.1)
Sodium: 138 mmol/L (ref 135–145)
Total Bilirubin: 0.8 mg/dL (ref 0.3–1.2)
Total Protein: 7.2 g/dL (ref 6.5–8.1)

## 2019-07-05 LAB — CBC
HCT: 29.9 % — ABNORMAL LOW (ref 36.0–46.0)
HCT: 29.8 % — ABNORMAL LOW (ref 36.0–46.0)
Hemoglobin: 9.5 g/dL — ABNORMAL LOW (ref 12.0–15.0)
Hemoglobin: 9.6 g/dL — ABNORMAL LOW (ref 12.0–15.0)
MCH: 29.5 pg (ref 26.0–34.0)
MCH: 29.7 pg (ref 26.0–34.0)
MCHC: 31.8 g/dL (ref 30.0–36.0)
MCHC: 32.2 g/dL (ref 30.0–36.0)
MCV: 92.9 fL (ref 80.0–100.0)
MCV: 92.3 fL (ref 80.0–100.0)
Platelets: 268 10*3/uL (ref 150–400)
Platelets: 275 10*3/uL (ref 150–400)
RBC: 3.22 MIL/uL — ABNORMAL LOW (ref 3.87–5.11)
RBC: 3.23 MIL/uL — ABNORMAL LOW (ref 3.87–5.11)
RDW: 16.3 % — ABNORMAL HIGH (ref 11.5–15.5)
RDW: 16.6 % — ABNORMAL HIGH (ref 11.5–15.5)
WBC: 6.9 10*3/uL (ref 4.0–10.5)
WBC: 7.9 10*3/uL (ref 4.0–10.5)
nRBC: 0 % (ref 0.0–0.2)
nRBC: 0 % (ref 0.0–0.2)

## 2019-07-05 LAB — HEMOGLOBIN AND HEMATOCRIT, BLOOD
HCT: 29.7 % — ABNORMAL LOW (ref 36.0–46.0)
Hemoglobin: 9.4 g/dL — ABNORMAL LOW (ref 12.0–15.0)

## 2019-07-05 LAB — GLUCOSE, CAPILLARY
Glucose-Capillary: 109 mg/dL — ABNORMAL HIGH (ref 70–99)
Glucose-Capillary: 62 mg/dL — ABNORMAL LOW (ref 70–99)
Glucose-Capillary: 66 mg/dL — ABNORMAL LOW (ref 70–99)
Glucose-Capillary: 68 mg/dL — ABNORMAL LOW (ref 70–99)
Glucose-Capillary: 82 mg/dL (ref 70–99)
Glucose-Capillary: 83 mg/dL (ref 70–99)
Glucose-Capillary: 83 mg/dL (ref 70–99)
Glucose-Capillary: 87 mg/dL (ref 70–99)

## 2019-07-05 LAB — PREPARE RBC (CROSSMATCH)

## 2019-07-05 MED ORDER — SODIUM CHLORIDE 0.9% FLUSH: 10.0000 mL | INTRAVENOUS | Status: DC | PRN

## 2019-07-05 MED ORDER — DEXTROSE-NACL 5-0.9 % IV SOLN: INTRAVENOUS | Status: DC

## 2019-07-05 MED ORDER — SODIUM CHLORIDE 0.9% FLUSH
10.0000 mL | Freq: Two times a day (BID) | INTRAVENOUS | Status: DC
  Administered 2019-07-05 – 2019-07-07 (×3): 10 mL

## 2019-07-05 MED ORDER — SODIUM CHLORIDE (PF) 0.9 % IJ SOLN
INTRAMUSCULAR | Status: AC
  Filled 2019-07-05: qty 50

## 2019-07-05 MED ORDER — CHLORHEXIDINE GLUCONATE CLOTH 2 % EX PADS
6.0000 | MEDICATED_PAD | Freq: Every day | CUTANEOUS | Status: DC
  Administered 2019-07-05 – 2019-07-08 (×4): 6 via TOPICAL

## 2019-07-05 MED ORDER — IOHEXOL 300 MG/ML  SOLN
100.0000 mL | Freq: Once | INTRAMUSCULAR | Status: AC | PRN
  Administered 2019-07-05: 100 mL via INTRAVENOUS

## 2019-07-05 MED ORDER — IOHEXOL 9 MG/ML PO SOLN
ORAL | Status: AC
  Filled 2019-07-05: qty 1000

## 2019-07-05 NOTE — ED Notes (Signed)
ED TO INPATIENT HANDOFF REPORT  ED Nurse Name and Phone #: Fredonia Highland [phone]  S Name/Age/Gender Gina [name] 59 y.o. female Room/Bed: WA07/WA07  Code Status   Code Status: DNR  Home/SNF/Other Home Patient oriented to: self, place, time and situation Is this baseline? Yes   Triage Complete: Triage complete  Chief Complaint Symptomatic anemia [D64.9]  Triage Note Pt BIBA from PCP office. -  Per EMS-  Pt c/o n/v and low hemoglobin.   Pt has PICC in right upper arm.   PCP gave 8 mg ODT zofran.   AOx4,     Allergies Allergies  Allergen Reactions  . Bee Venom Swelling    Swells up with bee stings   . Metformin And Related Other (See Comments)    Upset stomach    Level of Care/Admitting Diagnosis ED Disposition    ED Disposition Condition [facility] Area: [place] [[id number]]  Level of Care: Telemetry [5]  Admit to tele based on following criteria: Complex arrhythmia (Bradycardia/Tachycardia)  Covid Evaluation: Asymptomatic Screening Protocol (No Symptoms)  Diagnosis: Symptomatic anemia [7741287]  Admitting Physician: Elwyn Reach [[id number]]  Attending Physician: Elwyn Reach [[id number]]  Estimated length of stay: past midnight tomorrow  Certification:: I certify this patient will need inpatient services for at least 2 midnights       B Medical/Surgery History Past Medical History:  Diagnosis Date  . Diabetes mellitus   . Hypertension   . Osteomyelitis of right foot ([place]) 02/22/2017  . Perforated gastric ulcer ([place])   . S/P bilateral BKA (below knee amputation) ([place])   . Schizophrenia ([place])   . Schizophrenia ([place])   . Septic arthritis of interphalangeal joint of toe of left foot ([place]) 06/02/2016  . Stress incontinence 04/26/2017   Past Surgical History:  Procedure Laterality Date  . AMPUTATION Left 06/05/2016   Procedure: LEFT FOOT TRANSMETATARSAL AMPUTATION;  Surgeon: [name], MD;  Location: [facility];   Service: Orthopedics;  Laterality: Left;  . AMPUTATION Right 11/11/2017   Procedure: AMPUTATION BELOW KNEE;  Surgeon: [name], MD;  Location: [place];  Service: Orthopedics;  Laterality: Right;  . AMPUTATION Left 05/10/2018   Procedure: LEFT BELOW KNEE AMPUTATION;  Surgeon: [name], MD;  Location: [place];  Service: Orthopedics;  Laterality: Left;  . AMPUTATION Left 12/28/2018   Procedure: AMPUTATION THIRD DIGIT;  Surgeon: Iran Planas, MD;  Location: [facility];  Service: Orthopedics;  Laterality: Left;  . AMPUTATION TOE Right 02/23/2017   Procedure: AMPUTATION RIGHT THIRD TOE;  Surgeon: [name], MD;  Location: [place];  Service: Orthopedics;  Laterality: Right;  . I & D EXTREMITY Right 11/09/2017   Procedure: Debride Ulcer Right Heel;  Surgeon: [name], MD;  Location: [place];  Service: Orthopedics;  Laterality: Right;  . I & D EXTREMITY Left 12/28/2018   Procedure: IRRIGATION AND DEBRIDEMENT EXTREMITY;  Surgeon: Iran Planas, MD;  Location[facility];  Service: Orthopedics;  Laterality: Left;  . INCISION AND DRAINAGE OF WOUND Left 12/26/2018   Procedure: IRRIGATION AND DEBRIDEMENT LEFT HAND;  Surgeon: Iran Planas, MD;  Location[facility];  Service: Orthopedics;  Laterality: Left;  . LAPAROTOMY N/A 02/02/2019   Procedure: EXPLORATORY LAPAROTOMY WITH OVERSEW OF DUODENAL ULCER;  Surgeon: Alphonsa Overall, MD;  Location[facility];  Service: General;  Laterality: N/A;  . TEE WITHOUT CARDIOVERSION N/A 05/27/2019   Procedure: TRANSESOPHAGEAL ECHOCARDIOGRAM (TEE);  Surgeon: Skeet Latch, MD;  Location: [place];  Service: Cardiovascular;  Laterality: N/A;  . TUBAL LIGATION       A IV Location/Drains/Wounds Patient Lines/Drains/Airways Status   Active Line/Drains/Airways    Name:   Placement date:   Placement time:   Site:   Days:   PICC Double Lumen 05/29/19 PICC Right Brachial 36 cm 1 cm   05/29/19    1400     37   Closed System Drain Right RLQ Bulb (JP) 19 Fr.   02/02/19    --     RLQ   153   Open Drain LLQ 12 Fr.   02/03/19    --    LLQ   152   Colostomy Other (comment)   --    --    Other (comment)      Urethral Catheter Leonides Sake, Nursing Student Latex 16 Fr.   06/27/19    1708    Latex   8   Pressure Injury 05/22/19 Coccyx Bilateral Stage 2 -  Partial thickness loss of dermis presenting as a shallow open injury with a red, pink wound bed without slough.   05/22/19    2104     44   Pressure Injury 05/22/19 Knee Right;Distal Unstageable - Full thickness tissue loss in which the base of the injury is covered by slough (yellow, tan, gray, green or brown) and/or eschar (tan, brown or black) in the wound bed. Stump   05/22/19    2106     44   Pressure Injury 05/22/19 Knee Left;Distal Unstageable - Full thickness tissue loss in which the base of the injury is covered by slough (yellow, tan, gray, green or brown) and/or eschar (tan, brown or black) in the wound bed. Stump   05/22/19    2108     44   Pressure Injury 05/22/19 Thigh Left;Mid Stage 2 -  Partial thickness loss of dermis presenting as a shallow open injury with a red, pink wound bed without slough.   05/22/19    2109     44   Wound / Incision (Open or Dehisced) 05/22/19 Non-pressure wound Knee Posterior;Right;Lateral   05/22/19    2116    Knee   44   Wound / Incision (Open or Dehisced) 05/22/19 Non-pressure wound Knee Left;Posterior;Lateral   05/22/19    2118    Knee   44   Wound / Incision (Open or Dehisced) 05/22/19 Incision - Open Abdomen Medial;Mid   05/22/19    2119    Abdomen   44          Intake/Output Last 24 hours  Intake/Output Summary (Last 24 hours) at 07/05/2019 0052 Last data filed at 07/04/2019 2300 Gross per 24 hour  Intake 400 ml  Output --  Net 400 ml    Labs/Imaging Results for orders placed or performed during the hospital encounter of 07/04/19 (from the past 48 hour(s))  POC occult blood, ED     Status: Abnormal   Collection Time: 07/04/19  3:32 PM  Result Value Ref Range   Fecal Occult  Bld POSITIVE (A) NEGATIVE  Comprehensive metabolic panel     Status: Abnormal   Collection Time: 07/04/19  4:47 PM  Result Value Ref Range   Sodium 139 135 - 145 mmol/L   Potassium 4.0 3.5 - 5.1 mmol/L   Chloride 106 98 - 111 mmol/L   CO2 25 22 - 32 mmol/L   Glucose, Bld 135 (H) 70 - 99 mg/dL    Comment: Glucose reference range  applies only to samples taken after fasting for at least 8 hours.   BUN 43 (H) 6 - 20 mg/dL   Creatinine, Ser 0.91 0.44 - 1.00 mg/dL   Calcium 8.8 (L) 8.9 - 10.3 mg/dL   Total Protein 7.6 6.5 - 8.1 g/dL   Albumin 2.5 (L) 3.5 - 5.0 g/dL   AST 15 15 - 41 U/L   ALT 13 0 - 44 U/L   Alkaline Phosphatase 67 38 - 126 U/L   Total Bilirubin 0.5 0.3 - 1.2 mg/dL   GFR calc non Af Amer >60 >60 mL/min   GFR calc Af Amer >60 >60 mL/min   Anion gap 8 5 - 15    Comment: Performed at [facility], [place] [address] [place], [place] [address]  CBC with Differential     Status: Abnormal   Collection Time: 07/04/19  4:47 PM  Result Value Ref Range   WBC 9.6 4.0 - 10.5 K/uL   RBC 2.18 (L) 3.87 - 5.11 MIL/uL   Hemoglobin 6.6 (LL) 12.0 - 15.0 g/dL    Comment: REPEATED TO VERIFY THIS CRITICAL RESULT HAS VERIFIED AND BEEN CALLED TO C.SIMPSON BY NATHAN THOMPSON ON 04 23 2021 AT 1723, AND HAS BEEN READ BACK. CRITICAL RESULT VERIFIED    HCT 21.0 (L) 36.0 - 46.0 %   MCV 96.3 80.0 - 100.0 fL   MCH 30.3 26.0 - 34.0 pg   MCHC 31.4 30.0 - 36.0 g/dL   RDW 15.6 (H) 11.5 - 15.5 %   Platelets 300 150 - 400 K/uL   nRBC 0.0 0.0 - 0.2 %   Neutrophils Relative % 76 %   Neutro Abs 7.3 1.7 - 7.7 K/uL   Lymphocytes Relative 15 %   Lymphs Abs 1.4 0.7 - 4.0 K/uL   Monocytes Relative 9 %   Monocytes Absolute 0.9 0.1 - 1.0 K/uL   Eosinophils Relative 0 %   Eosinophils Absolute 0.0 0.0 - 0.5 K/uL   Basophils Relative 0 %   Basophils Absolute 0.0 0.0 - 0.1 K/uL   Immature Granulocytes 0 %   Abs Immature Granulocytes 0.02 0.00 - 0.07 K/uL    Comment: Performed at [facility], [place] [address], [place], [place] [address]  Type and screen     Status: None (Preliminary result)   Collection Time: 07/04/19  4:47 PM  Result Value Ref Range   ABO/RH(D) O POS    Antibody Screen NEG    Sample Expiration 07/07/2019,2359    Unit Number [id number]    Blood Component Type RED CELLS,LR    Unit division 00    Status of Unit ISSUED    Transfusion Status OK TO TRANSFUSE    Crossmatch Result Compatible    Unit Number [id number]    Blood Component Type RED CELLS,LR    Unit division 00    Status of Unit ISSUED    Transfusion Status OK TO TRANSFUSE    Crossmatch Result      Compatible Performed at [facility], [place] [address] [place], [place] [address]   Urinalysis, Routine w reflex microscopic     Status: Abnormal   Collection Time: 07/04/19  5:54 PM  Result Value Ref Range   Color, Urine YELLOW YELLOW   APPearance CLOUDY (A) CLEAR   Specific Gravity, Urine 1.016 1.005 - 1.030   pH 7.0 5.0 - 8.0   Glucose, UA NEGATIVE NEGATIVE mg/dL   Hgb urine dipstick SMALL (A) NEGATIVE  Bilirubin Urine NEGATIVE NEGATIVE   Ketones, ur NEGATIVE NEGATIVE mg/dL   Protein, ur >=300 (A) NEGATIVE mg/dL   Nitrite NEGATIVE NEGATIVE   Leukocytes,Ua LARGE (A) NEGATIVE   RBC / HPF 21-50 0 - 5 RBC/hpf   WBC, UA >50 (H) 0 - 5 WBC/hpf   Bacteria, UA MANY (A) NONE SEEN   Squamous Epithelial / LPF 0-5 0 - 5   Mucus PRESENT    Hyaline Casts, UA PRESENT    Triple Phosphate Crystal PRESENT     Comment: Performed at [facility], [place] [address] [place], [place] [address]  Prepare RBC (crossmatch)     Status: None   Collection Time: 07/04/19  6:00 PM  Result Value Ref Range   Order Confirmation      ORDER PROCESSED BY BLOOD BANK Performed at [facility], [place] [address] [place], [place] [address]   Respiratory Panel by RT PCR (Flu A&B, Covid) - Nasopharyngeal Swab     Status: None   Collection Time:  07/04/19  6:40 PM   Specimen: Nasopharyngeal Swab  Result Value Ref Range   SARS Coronavirus 2 by RT PCR NEGATIVE NEGATIVE    Comment: (NOTE) SARS-CoV-2 target nucleic acids are NOT DETECTED. The SARS-CoV-2 RNA is generally detectable in upper respiratoy specimens during the acute phase of infection. The lowest concentration of SARS-CoV-2 viral copies this assay can detect is 131 copies/mL. A negative result does not preclude SARS-Cov-2 infection and should not be used as the sole basis for treatment or other patient management decisions. A negative result may occur with  improper specimen collection/handling, submission of specimen other than nasopharyngeal swab, presence of viral mutation(s) within the areas targeted by this assay, and inadequate number of viral copies (<131 copies/mL). A negative result must be combined with clinical observations, patient history, and epidemiological information. The expected result is Negative. Fact Sheet for Patients:  PinkCheek.be Fact Sheet for Healthcare Providers:  GravelBags.it This test is not yet ap proved or cleared by the [place] FDA and  has been authorized for detection and/or diagnosis of SARS-CoV-2 by FDA under an Emergency Use Authorization (EUA). This EUA will remain  in effect (meaning this test can be used) for the duration of the COVID-19 declaration under Section 564(b)(1) of the Act, 21 U.S.C. section 360bbb-3(b)(1), unless the authorization is terminated or revoked sooner.    Influenza A by PCR NEGATIVE NEGATIVE   Influenza B by PCR NEGATIVE NEGATIVE    Comment: (NOTE) The Xpert Xpress SARS-CoV-2/FLU/RSV assay is intended as an aid in  the diagnosis of influenza from Nasopharyngeal swab specimens and  should not be used as a sole basis for treatment. Nasal washings and  aspirates are unacceptable for Xpert Xpress SARS-CoV-2/FLU/RSV  testing. Fact Sheet for  Patients: PinkCheek.be Fact Sheet for Healthcare Providers: GravelBags.it This test is not yet approved or cleared by the [place] FDA and  has been authorized for detection and/or diagnosis of SARS-CoV-2 by  FDA under an Emergency Use Authorization (EUA). This EUA will remain  in effect (meaning this test can be used) for the duration of the  Covid-19 declaration under Section 564(b)(1) of the Act, 21  U.S.C. section 360bbb-3(b)(1), unless the authorization is  terminated or revoked. Performed at [facility], [place] [address] [place], [place] [address]    DG Chest Port 1 View  Result Date: 07/04/2019 CLINICAL DATA:  Nausea and vomiting, anemia, PICC placement EXAM: PORTABLE CHEST 1 VIEW COMPARISON:  06/01/2019 FINDINGS: Single frontal view of the chest demonstrates right-sided PICC tip overlying superior vena cava. The cardiac silhouette is unremarkable. No airspace disease, effusion, or pneumothorax. No acute bony abnormalities. IMPRESSION: 1. Right PICC overlying superior vena cava. 2. No acute intrathoracic process. Electronically Signed   By: Randa Ngo M.D.   On: 07/04/2019 16:36    Pending Labs Unresulted Labs (From admission, onward)    Start     Ordered   07/05/19 0500  Comprehensive metabolic panel  Tomorrow morning,   R     07/04/19 1917   07/04/19 1917  CBC  Now then every 6 hours,   R (with STAT occurrences)     07/04/19 1917   07/04/19 1917  Prepare RBC (crossmatch)  (Adult Blood Administration - Red Blood Cells)  Once,   R    Question Answer Comment  # of Units 2 units   Transfusion Indications Symptomatic Anemia   Number of Units to Keep Ahead 2 units ahead   Keep ahead indications Active Bleeding/GI Bleed   If emergent release call blood bank [facility] [phone]      07/04/19 1917          Vitals/Pain Today's Vitals   07/04/19 2300 07/04/19 2315 07/04/19 2330 07/04/19  2340  BP: 117/73 129/69 128/70 113/63  Pulse: 88 87 92 88  Resp: 16 14 10  (!) 21  Temp: 98.5 F (36.9 C)   99 F (37.2 C)  TempSrc:      SpO2: 97% 100% 100% 99%  PainSc:        Isolation Precautions No active isolations  Medications Medications  pantoprazole (PROTONIX) 80 mg in sodium chloride 0.9 % 100 mL (0.8 mg/mL) infusion (8 mg/hr Intravenous New Bag/Given 07/04/19 2308)  pantoprazole (PROTONIX) injection 40 mg (has no administration in time range)  0.9 %  sodium chloride infusion ( Intravenous New Bag/Given 07/04/19 2309)  ondansetron (ZOFRAN) tablet 4 mg ( Oral See Alternative 07/04/19 2126)    Or  ondansetron (ZOFRAN) injection 4 mg (4 mg Intravenous Given 07/04/19 2126)  morphine 2 MG/ML injection 2 mg (2 mg Intravenous Given 07/04/19 2126)  0.9 %  sodium chloride infusion (Manually program via Guardrails IV Fluids) ( Intravenous Not Given 07/04/19 2242)  insulin aspart (novoLOG) injection 0-9 Units (has no administration in time range)  insulin aspart (novoLOG) injection 0-5 Units (has no administration in time range)  0.9 %  sodium chloride infusion (10 mL/hr Intravenous New Bag/Given 07/04/19 2000)  pantoprazole (PROTONIX) 80 mg in sodium chloride 0.9 % 100 mL IVPB (0 mg Intravenous Stopped 07/04/19 1905)  pantoprazole (PROTONIX) 80 mg in sodium chloride 0.9 % 100 mL IVPB (80 mg Intravenous New Bag/Given 07/04/19 2132)    Mobility non-ambulatory High fall risk   Focused Assessments gen med   R Recommendations: See Admitting Provider Note  Report given to:   Additional Notes:

## 2019-07-05 NOTE — Progress Notes (Addendum)
PROGRESS NOTE    Gina [name]  VOZ:[id number] DOB: August 31, 1960 DOA: 07/04/2019 PCP: [name], MD   Brief Narrative: 59 year old with past medical history significant for diabetes, hypertension, bilateral BKA, perforated gastric ulcer, osteomyelitis, schizophrenia, recurrent DKA's, history of peritonsillar abscess, history of MSSA bacteremia who presents to the ER complaining of abdominal pain and nausea.  She is having pain more in the epigastric area.  Also patient presented with a drop in hemoglobin at 6.6 on admission.  Occult blood positive.  She recently completed treatment for MRSA bacteremia March of this year.  She has a prior history of acute peritonitis with exploratory laparotomy done with Grahan patch November last year.  Subsequently had Candida glabrata blood cultures and peritoneal fluid also showing Candida.  She had IR drainage.   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  Received 2 units of packed red blood cell.  Hemoglobin increased to 9. GI consulted, no overt GI bleeding.  GI recommends CT abdomen and pelvis to evaluate abdominal pain.  No plan for endoscopy at this point.  2-Hypertension: Continue to hold medication due to severe anemia and concern for GI bleed.  3-Prior history of acute peritonitis status post exploratory laparotomy with [name] patch November 4259, complicated by enterocutaneous fistula and currently has IR guided drain placement as well Eakins over the central abdomen: -Plan to proceed with CT abdomen and pelvis. -Might need surgical consultation depending on results  4-Bilateral BKA: With wound.  Wound care consulted 5-history of MRSA bacteremia completed antibiotic March 25. Pressure injury Coccyx Bilateral.  Wound care consulted.  6-Severe Malnutrition; resume TPN.   Pressure Injury 05/22/19 Coccyx Bilateral Stage 2 -  Partial thickness loss of dermis presenting as a shallow open injury with a red, pink wound bed without slough. (Active)  05/22/19 2104  Location: Coccyx  Location Orientation: Bilateral  Staging: Stage 2 -  Partial thickness loss of dermis presenting as a shallow open injury with a red, pink wound bed without slough.  Wound Description (Comments):   Present on Admission: Yes     Pressure Injury 05/22/19 Knee Right;Distal Unstageable - Full thickness tissue loss in which the base of the injury is covered by slough (yellow, tan, gray, green or brown) and/or eschar (tan, brown or black) in the wound bed. Stump (Active)  05/22/19 2106  Location: Knee  Location Orientation: Right;Distal  Staging: Unstageable - Full thickness tissue loss in which the base of the injury is covered by slough (yellow, tan, gray, green or brown) and/or eschar (tan, brown or black) in the wound bed.  Wound Description (Comments): Stump  Present on Admission: Yes     Pressure Injury 05/22/19 Knee Left;Distal Unstageable - Full thickness tissue loss in which the base of the injury is covered by slough (yellow, tan, gray, green or brown) and/or eschar (tan, brown or black) in the wound bed. Stump (Active)  05/22/19 2108  Location: Knee  Location Orientation: Left;Distal  Staging: Unstageable - Full thickness tissue loss in which the base of the injury is covered by slough (yellow, tan, gray, green or brown) and/or eschar (tan, brown or black) in the wound bed.  Wound Description (Comments): Stump  Present on Admission: Yes     Pressure Injury 05/22/19 Thigh Left;Mid Stage  2 -  Partial thickness loss of dermis presenting as a shallow open injury with a red, pink wound bed without slough. (Active)  05/22/19 2109  Location: Thigh  Location Orientation: Left;Mid  Staging: Stage 2 -  Partial thickness loss of  dermis presenting as a shallow open injury with a red, pink wound bed without slough.  Wound Description (Comments):   Present on Admission: Yes     Estimated body mass index is 24.6 kg/m as calculated from the following:   Height as of 06/28/19: 5\' 2"  (1.575 m).   Weight as of 06/28/19: 61 kg.   DVT prophylaxis: SCD Code Status: DNR Family Communication: Try to contact daughter  Disposition Plan:  Patient is from: Home Anticipated d/c date: 2-3 days depending on Ct scan results.  Barriers to d/c or necessity for inpatient status: patient with GI bleed, monitor for recurrent bleeding, abdominal pain undergoing evaluation.   Consultants:   GI  Procedures:   None  Antimicrobials:    Subjective: Alert, complaining of abdominal pain, chronic ill appearing.   Objective: Vitals:   07/05/19 0258 07/05/19 0547 07/05/19 0938 07/05/19 1404  BP: 140/83 132/75 (!) 144/79 (!) 133/97  Pulse: 80 76 78 77  Resp: 20 19 20 18   Temp: 97.7 F (36.5 C) 98.1 F (36.7 C) 98.7 F (37.1 C) 98.3 F (36.8 C)  TempSrc: Oral Oral Oral Oral  SpO2: 100% 100% 100% 100%    Intake/Output Summary (Last 24 hours) at 07/05/2019 1456 Last data filed at 07/05/2019 1100 Gross per 24 hour  Intake 802.08 ml  Output 1150 ml  Net -347.92 ml   There were no vitals filed for this visit.  Examination:  General exam: Chronic ill appearing Respiratory system: Clear to auscultation. Respiratory effort normal. Cardiovascular system: S1 & S2 heard, RRR. No JVD, murmurs, rubs, gallops or clicks. No pedal edema. Gastrointestinal system: Abdomen soft, drain left, midline eakin wound drainage.  Central nervous system: Alert  Extremities: BL BKA   Data Reviewed: I have personally reviewed following labs and imaging studies  CBC: Recent Labs  Lab 06/28/19 1645 06/28/19 1645 06/28/19 2300 06/29/19 0138 07/03/19 2050 07/04/19 1647 07/05/19 0117 07/05/19 0320 07/05/19 0840  WBC 7.3   < > 4.2  --   5.9 9.6 6.9  --  7.9  NEUTROABS 5.0  --  2.4  --  2.8 7.3  --   --   --   HGB 6.7*   < > 10.5*   < > 6.7* 6.6* 9.5* 9.4* 9.6*  HCT 21.8*   < > 33.8*   < > 21.2* 21.0* 29.9* 29.7* 29.8*  MCV 96.0   < > 93.6  --  95.1 96.3 92.9  --  92.3  PLT 379   < > 273  --  327 300 268  --  275   < > = values in this interval not displayed.   Basic Metabolic Panel: Recent Labs  Lab 06/28/19 1645 06/28/19 2300 07/03/19 2050 07/04/19 1647 07/05/19 0320  NA 136 138 134* 139 138  K 4.0 4.2 3.9 4.0 4.3  CL 103 105 102 106 107  CO2 25 26 26 25 23   GLUCOSE 246* 190* 124* 135* 98  BUN 36* 44* 34* 43* 40*  CREATININE 0.65 0.73 0.70 0.91 0.92  CALCIUM 8.3* 8.3* 8.6* 8.8* 8.6*  MG  --   --  1.7  --   --   PHOS 3.9  --  4.1  --   --  GFR: Estimated Creatinine Clearance: 57.3 mL/min (by C-G formula based on SCr of 0.92 mg/dL). Liver Function Tests: Recent Labs  Lab 06/28/19 1645 07/03/19 2050 07/04/19 1647 07/05/19 0320  AST 20 18 15  14*  ALT 15 14 13 11   ALKPHOS 84 76 67 61  BILITOT 0.5 0.4 0.5 0.8  PROT 7.0 7.0 7.6 7.2  ALBUMIN 2.1* 2.2* 2.5* 2.3*   No results for input(s): LIPASE, AMYLASE in the last 168 hours. No results for input(s): AMMONIA in the last 168 hours. Coagulation Profile: No results for input(s): INR, PROTIME in the last 168 hours. Cardiac Enzymes: No results for input(s): CKTOTAL, CKMB, CKMBINDEX, TROPONINI in the last 168 hours. BNP (last 3 results) No results for input(s): PROBNP in the last 8760 hours. HbA1C: No results for input(s): HGBA1C in the last 72 hours. CBG: Recent Labs  Lab 07/05/19 0813 07/05/19 1147  GLUCAP 82 87   Lipid Profile: No results for input(s): CHOL, HDL, LDLCALC, TRIG, CHOLHDL, LDLDIRECT in the last 72 hours. Thyroid Function Tests: No results for input(s): TSH, T4TOTAL, FREET4, T3FREE, THYROIDAB in the last 72 hours. Anemia Panel: No results for input(s): VITAMINB12, FOLATE, FERRITIN, TIBC, IRON, RETICCTPCT in the last 72  hours. Sepsis Labs: No results for input(s): PROCALCITON, LATICACIDVEN in the last 168 hours.  Recent Results (from the past 240 hour(s))  Respiratory Panel by RT PCR (Flu A&B, Covid) - Nasopharyngeal Swab     Status: None   Collection Time: 07/04/19  6:40 PM   Specimen: Nasopharyngeal Swab  Result Value Ref Range Status   SARS Coronavirus 2 by RT PCR NEGATIVE NEGATIVE Final    Comment: (NOTE) SARS-CoV-2 target nucleic acids are NOT DETECTED. The SARS-CoV-2 RNA is generally detectable in upper respiratoy specimens during the acute phase of infection. The lowest concentration of SARS-CoV-2 viral copies this assay can detect is 131 copies/mL. A negative result does not preclude SARS-Cov-2 infection and should not be used as the sole basis for treatment or other patient management decisions. A negative result may occur with  improper specimen collection/handling, submission of specimen other than nasopharyngeal swab, presence of viral mutation(s) within the areas targeted by this assay, and inadequate number of viral copies (<131 copies/mL). A negative result must be combined with clinical observations, patient history, and epidemiological information. The expected result is Negative. Fact Sheet for Patients:  PinkCheek.be Fact Sheet for Healthcare Providers:  GravelBags.it This test is not yet ap proved or cleared by the [place] FDA and  has been authorized for detection and/or diagnosis of SARS-CoV-2 by FDA under an Emergency Use Authorization (EUA). This EUA will remain  in effect (meaning this test can be used) for the duration of the COVID-19 declaration under Section 564(b)(1) of the Act, 21 U.S.C. section 360bbb-3(b)(1), unless the authorization is terminated or revoked sooner.    Influenza A by PCR NEGATIVE NEGATIVE Final   Influenza B by PCR NEGATIVE NEGATIVE Final    Comment: (NOTE) The Xpert Xpress  SARS-CoV-2/FLU/RSV assay is intended as an aid in  the diagnosis of influenza from Nasopharyngeal swab specimens and  should not be used as a sole basis for treatment. Nasal washings and  aspirates are unacceptable for Xpert Xpress SARS-CoV-2/FLU/RSV  testing. Fact Sheet for Patients: PinkCheek.be Fact Sheet for Healthcare Providers: GravelBags.it This test is not yet approved or cleared by the [place] FDA and  has been authorized for detection and/or diagnosis of SARS-CoV-2 by  FDA under an Emergency Use Authorization (EUA). This EUA will  remain  in effect (meaning this test can be used) for the duration of the  Covid-19 declaration under Section 564(b)(1) of the Act, 21  U.S.C. section 360bbb-3(b)(1), unless the authorization is  terminated or revoked. Performed at [facility], [place] [address] [place], [place] [address]          Radiology Studies: DG Chest Port 1 View  Result Date: 07/04/2019 CLINICAL DATA:  Nausea and vomiting, anemia, PICC placement EXAM: PORTABLE CHEST 1 VIEW COMPARISON:  06/01/2019 FINDINGS: Single frontal view of the chest demonstrates right-sided PICC tip overlying superior vena cava. The cardiac silhouette is unremarkable. No airspace disease, effusion, or pneumothorax. No acute bony abnormalities. IMPRESSION: 1. Right PICC overlying superior vena cava. 2. No acute intrathoracic process. Electronically Signed   By: Randa Ngo M.D.   On: 07/04/2019 16:36        Scheduled Meds: . sodium chloride   Intravenous Once  . Chlorhexidine Gluconate Cloth  6 each Topical Daily  . insulin aspart  0-5 Units Subcutaneous QHS  . insulin aspart  0-9 Units Subcutaneous TID WC  . iohexol      . [START ON 07/08/2019] pantoprazole  40 mg Intravenous Q12H  . sodium chloride (PF)      . sodium chloride flush  10-40 mL Intracatheter Q12H   Continuous Infusions: . sodium chloride 75  mL/hr at 07/04/19 2309  . pantoprozole (PROTONIX) infusion 8 mg/hr (07/05/19 0855)     LOS: 1 day    Time spent: 35 minutes.     Elmarie Shiley, MD [facility]   If 7PM-7AM, please contact night-coverage [email or web address]  07/05/2019, 2:56 PM

## 2019-07-05 NOTE — Consult Note (Signed)
Referring Provider: EDP Primary Care Physician:  [name], MD Primary Gastroenterologist: Althia Forts  Reason for Consultation: Symptomatic anemia  HPI: Gina [name] is a 59 y.o. female with past medical history of perforated peptic ulcer with exploratory laparotomy and oversew of duodenal ulcer with [name] patch in November 3244 which was complicated by enterocutaneous fistula and currently has IR guided drain placement as well as Eakins over the central abdomen, osteomyelitis with bilateral BKA's, history of schizophrenia and  recent admission for MRSA bacteremia, history of peritonitis presented to the hospital with nausea vomiting and weakness.  Found to have a hemoglobin of 6.6.  Heme positive stool.  GI is consulted for further evaluation.  Patient seen and examined at bedside.  Complaining of generalized abdominal discomfort.  RN at bedside.  No evidence of overt bleeding. Eakins contains brown-colored material, minimal content into the IR guided drain placement.     Past Medical History:  Diagnosis Date  . Diabetes mellitus   . Hypertension   . Osteomyelitis of right foot ([place]) 02/22/2017  . Perforated gastric ulcer ([place])   . S/P bilateral BKA (below knee amputation) ([place])   . Schizophrenia ([place])   . Schizophrenia ([place])   . Septic arthritis of interphalangeal joint of toe of left foot ([place]) 06/02/2016  . Stress incontinence 04/26/2017    Past Surgical History:  Procedure Laterality Date  . AMPUTATION Left 06/05/2016   Procedure: LEFT FOOT TRANSMETATARSAL AMPUTATION;  Surgeon: [name], MD;  Location: [facility];  Service: Orthopedics;  Laterality: Left;  . AMPUTATION Right 11/11/2017   Procedure: AMPUTATION BELOW KNEE;  Surgeon: [name], MD;  Location: [place];  Service: Orthopedics;  Laterality: Right;  . AMPUTATION Left 05/10/2018   Procedure: LEFT BELOW KNEE AMPUTATION;  Surgeon: [name], MD;  Location: [place];  Service: Orthopedics;  Laterality: Left;  .  AMPUTATION Left 12/28/2018   Procedure: AMPUTATION THIRD DIGIT;  Surgeon: Iran Planas, MD;  Location: [facility];  Service: Orthopedics;  Laterality: Left;  . AMPUTATION TOE Right 02/23/2017   Procedure: AMPUTATION RIGHT THIRD TOE;  Surgeon: [name], MD;  Location: [place];  Service: Orthopedics;  Laterality: Right;  . I & D EXTREMITY Right 11/09/2017   Procedure: Debride Ulcer Right Heel;  Surgeon: [name], MD;  Location: [place];  Service: Orthopedics;  Laterality: Right;  . I & D EXTREMITY Left 12/28/2018   Procedure: IRRIGATION AND DEBRIDEMENT EXTREMITY;  Surgeon: Iran Planas, MD;  Location: [facility];  Service: Orthopedics;  Laterality: Left;  . INCISION AND DRAINAGE OF WOUND Left 12/26/2018   Procedure: IRRIGATION AND DEBRIDEMENT LEFT HAND;  Surgeon: Iran Planas, MD;  Location: [facility];  Service: Orthopedics;  Laterality: Left;  . LAPAROTOMY N/A 02/02/2019   Procedure: EXPLORATORY LAPAROTOMY WITH OVERSEW OF DUODENAL ULCER;  Surgeon: Alphonsa Overall, MD;  Location: [facility];  Service: General;  Laterality: N/A;  . TEE WITHOUT CARDIOVERSION N/A 05/27/2019   Procedure: TRANSESOPHAGEAL ECHOCARDIOGRAM (TEE);  Surgeon: Skeet Latch, MD;  Location: [place];  Service: Cardiovascular;  Laterality: N/A;  . TUBAL LIGATION      Prior to Admission medications   Medication Sig Start Date End Date Taking? Authorizing Provider  amLODipine (NORVASC) 5 MG tablet Take 1 tablet (5 mg total) by mouth daily. 06/10/19 07/10/19 Yes Shelly Coss, MD  ascorbic acid (VITAMIN C) 500 MG tablet Take 1 tablet (500 mg total) by mouth 2 (two) times daily. 06/10/19  Yes Shelly Coss, MD  escitalopram (LEXAPRO) 10  MG tablet Take 1 tablet (10 mg total) by mouth daily. Patient taking differently: Take 20 mg by mouth daily.  06/10/19  Yes Shelly Coss, MD  gabapentin (NEURONTIN) 300 MG capsule Take 1 capsule (300 mg total) by mouth 2 (two) times daily. 06/10/19  Yes Shelly Coss, MD  loperamide (IMODIUM)  2 MG capsule Take 1 capsule (2 mg total) by mouth every 8 (eight) hours. 06/10/19  Yes Adhikari, Tamsen Meek, MD  melatonin 3 MG TABS tablet Take 1 tablet (3 mg total) by mouth at bedtime. 06/10/19  Yes Shelly Coss, MD  metoprolol tartrate (LOPRESSOR) 50 MG tablet Take 1 tablet (50 mg total) by mouth 2 (two) times daily. 06/10/19  Yes Shelly Coss, MD  Nutritional Supplements (ENSURE ENLIVE PO) Take 237 mLs by mouth 2 (two) times daily with a meal.   Yes [provider]  ondansetron (ZOFRAN) 4 MG tablet Take 1 tablet (4 mg total) by mouth every 6 (six) hours as needed for nausea. 06/10/19  Yes Shelly Coss, MD  oxyCODONE (OXY IR/ROXICODONE) 5 MG immediate release tablet Take 1 tablet (5 mg total) by mouth every 6 (six) hours as needed (or pain). 06/10/19  Yes Shelly Coss, MD  pantoprazole (PROTONIX) 40 MG tablet Take 1 tablet (40 mg total) by mouth daily. 06/10/19  Yes Shelly Coss, MD  perphenazine (TRILAFON) 2 MG tablet Take 2 mg by mouth 2 (two) times daily.   Yes [provider]  polyethylene glycol (MIRALAX / GLYCOLAX) 17 g packet Take 17 g by mouth daily.   Yes [provider]  sennosides-docusate sodium (SENOKOT-S) 8.6-50 MG tablet Take 2 tablets by mouth at bedtime as needed for constipation.   Yes [provider]  simethicone (MYLICON) 80 MG chewable tablet Chew 80 mg by mouth 4 (four) times daily.   Yes [provider]  sodium chloride (OCEAN) 0.65 % SOLN nasal spray Place 1 spray into both nostrils every 4 (four) hours as needed (dry nasal membranes).   Yes [provider]    Scheduled Meds: . sodium chloride   Intravenous Once  . Chlorhexidine Gluconate Cloth  6 each Topical Daily  . insulin aspart  0-5 Units Subcutaneous QHS  . insulin aspart  0-9 Units Subcutaneous TID WC  . [START ON 07/08/2019] pantoprazole  40 mg Intravenous Q12H  . sodium chloride flush  10-40 mL Intracatheter Q12H   Continuous Infusions: . sodium  chloride 75 mL/hr at 07/04/19 2309  . pantoprozole (PROTONIX) infusion 8 mg/hr (07/04/19 2308)   PRN Meds:.morphine injection, ondansetron **OR** ondansetron (ZOFRAN) IV, sodium chloride flush  Allergies as of 07/04/2019 - Review Complete 07/04/2019  Allergen Reaction Noted  . Bee venom Swelling 12/26/2018  . Metformin and related Other (See Comments) 06/18/2013    Family History  Problem Relation Age of Onset  . Diabetes type II Father   . CAD Father   . Prostate cancer Father   . Diabetes Mellitus II Brother     Social History   Socioeconomic History  . Marital status: Single    Spouse name: Not on file  . Number of children: Not on file  . Years of education: Not on file  . Highest education level: Not on file  Occupational History  . Not on file  Tobacco Use  . Smoking status: Never Smoker  . Smokeless tobacco: Never Used  Substance and Sexual Activity  . Alcohol use: No    Comment: occasionally  . Drug use: No  . Sexual activity: Not  Currently  Other Topics Concern  . Not on file  Social History Narrative  . Not on file   Social Determinants of Health   Financial Resource Strain:   . Difficulty of Paying Living Expenses:   Food Insecurity:   . Worried About Charity fundraiser in the Last Year:   . Arboriculturist in the Last Year:   Transportation Needs:   . Film/video editor (Medical):   [name] Lack of Transportation (Non-Medical):   Physical Activity:   . Days of Exercise per Week:   . Minutes of Exercise per Session:   Stress:   . Feeling of Stress :   Social Connections:   . Frequency of Communication with Friends and Family:   . Frequency of Social Gatherings with Friends and Family:   . Attends Religious Services:   . Active Member of Clubs or Organizations:   . Attends Archivist Meetings:   [name] Marital Status:   Intimate Partner Violence:   . Fear of Current or Ex-Partner:   . Emotionally Abused:   [name] Physically Abused:   .  Sexually Abused:     Review of Systems: Limited review of system per HPI  Physical Exam: Vital signs: Vitals:   07/05/19 0258 07/05/19 0547  BP: 140/83 132/75  Pulse: 80 76  Resp: 20 19  Temp: 97.7 F (36.5 C) 98.1 F (36.7 C)  SpO2: 100% 100%   Last BM Date: 07/05/19 Physical Exam  Constitutional:  Elderly-appearing patient in mild distress from pain  HENT:  Head: Normocephalic and atraumatic.  Eyes: EOM are normal.  Cardiovascular: Normal rate and normal heart sounds.  Pulmonary/Chest: Effort normal. No respiratory distress.  Anterior exam only  Abdominal: Soft.  Midline eakin wound drainage with brown color fecal material noted, IR guided drain placement also noted, abdomen is soft but has generalized discomfort on palpation.  Bowel sounds present  Musculoskeletal:     Cervical back: Normal range of motion and neck supple.     Comments: Bilateral BKA.  Neurological:  Alert but not able to give appropriate history.  Skin: Skin is dry. No erythema.  Psychiatric: She has a normal mood and affect. Her behavior is normal.    GI:  Lab Results: Recent Labs    07/03/19 2050 07/03/19 2050 07/04/19 1647 07/05/19 0117 07/05/19 0320  WBC 5.9  --  9.6 6.9  --   HGB 6.7*   < > 6.6* 9.5* 9.4*  HCT 21.2*   < > 21.0* 29.9* 29.7*  PLT 327  --  300 268  --    < > = values in this interval not displayed.   BMET Recent Labs    07/03/19 2050 07/04/19 1647 07/05/19 0320  NA 134* 139 138  K 3.9 4.0 4.3  CL 102 106 107  CO2 26 25 23   GLUCOSE 124* 135* 98  BUN 34* 43* 40*  CREATININE 0.70 0.91 0.92  CALCIUM 8.6* 8.8* 8.6*   LFT Recent Labs    07/05/19 0320  PROT 7.2  ALBUMIN 2.3*  AST 14*  ALT 11  ALKPHOS 61  BILITOT 0.8   PT/INR No results for input(s): LABPROT, INR in the last 72 hours.   Studies/Results: DG Chest Port 1 View  Result Date: 07/04/2019 CLINICAL DATA:  Nausea and vomiting, anemia, PICC placement EXAM: PORTABLE CHEST 1 VIEW COMPARISON:   06/01/2019 FINDINGS: Single frontal view of the chest demonstrates right-sided PICC tip overlying superior vena cava. The cardiac  silhouette is unremarkable. No airspace disease, effusion, or pneumothorax. No acute bony abnormalities. IMPRESSION: 1. Right PICC overlying superior vena cava. 2. No acute intrathoracic process. Electronically Signed   By: Randa Ngo M.D.   On: 07/04/2019 16:36    Impression/Plan: -Anemia with occult blood positive stool.  No overt bleeding. -history of perforated peptic ulcer with exploratory laparotomy and oversew of duodenal ulcer with [name] patch in November 6349 which was complicated by enterocutaneous fistula and currently has IR guided drain placement as well as Eakins over the central abdomen -History of osteomyelitis -Bilateral BKA   Recommendations ----------------------- -There is no evidence of overt GI bleed.  Hemoglobin improved with blood transfusion. -Recommend CT abdomen pelvis for follow-up on enterocutaneous fistula. -May need surgery consult -Palliative care following  -Called patient's daughter.  No answer.  -GI will follow    LOS: 1 day   Otis Brace  MD, FACP 07/05/2019, 8:11 AM  Contact #  [phone]

## 2019-07-05 NOTE — ED Notes (Signed)
Pt lying in bed. NAD noted. Full monitor on. 2nd unit of blood transfusing. Pt denies and s/s of transfusion reaction.

## 2019-07-05 NOTE — ED Notes (Signed)
Pt lying in bed. NAD noted. Denies s/s of transfusion reaction. Pt heading to the floor.

## 2019-07-05 NOTE — Progress Notes (Signed)
PHARMACY - TOTAL PARENTERAL NUTRITION CONSULT NOTE   Indication: Fistula  Patient Measurements:     There is no height or weight on file to calculate BMI. 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 PMH includes DM2, HTN, osteomyelitis with bilateral BKA's, schizophrenia, recent admissions for fungal peritonitis and MRSA bacteremia requiring PICC replacement. Now admitted for Hgb 6.6 with occult blood positive stool. Pharmacy consulted to continue managing home TPN.  She has not been on TPN since admission on 4/23.  Glucose / Insulin: Hx DM, A1c 6.1 on 06/28/19. CBGs 83-98. Electrolytes: All wnl Renal: wnl LFTs / TGs: wnl, TG 86 (3/29) Prealbumin / albumin: prealb 9.3 (3/29), alb 2.3 Intake / Output; MIVF: NS 75 ml/hr GI Imaging:  4/23 CT abd: There is a fistula to the skin with overlying wound dressing and the evident nidus of contrast is small bowel in the left hemiabdomen. Cholelithiasis. Surgeries / Procedures:   Central access: PICC placed 05/29/19 TPN start date: chronic  Per review of home TPN records provided by [facility]: 1847ml cycled over 14hrs daily to provide 1472 kcals/day Protein 90g Dextrose 180g Lipids 50g  Nutritional Goals (per RD recommendation on ___): kCal: ___, Protein: ___, Fluid: ___ Goal TPN rate is ___ mL/hr (provides ___ g of protein and ___ kcals per day)  Current Nutrition:  Clear liquids  Plan: *Consult received after [facility] compounding deadline of 12:00, therefore, TPN will not start until 4/25*  Start TPN at __ mL/hr at 1800 Electrolytes in TPN: 82mEq/L of Na, 33mEq/L of K, 50mEq/L of Ca, 39mEq/L of Mg, and 61mmol/L of Phos. Cl:Ac ratio 1:1 Add standard MVI and trace elements to TPN Continue Sensitive SSI ACHS and adjust as needed  Reduce MIVF to ___ mL/hr at 1800 Monitor  TPN labs on Mon/Thurs, CMET, Mg and Phos in AM  Peggyann Juba, PharmD, BCPS Pharmacy: [phone] 07/05/2019,4:51 PM

## 2019-07-05 NOTE — Consult Note (Signed)
[place] Nurse wound consult note Consultation was completed by review of records, images and assistance from the bedside nurse/clinical staff.    Reason for Consult: bilateral LE wounds Wound type: discussed with bedside nurse, no wounds documented on nursing flowsheet during 2 person RN skin assessment or since admission.  Bedside nurse reports no wounds identified at this time.  She will contact [place] nurse if topical care needed.    Re consult if needed, will not follow at this time. Thanks  Saim Almanza [employer], RN,CWOCN, CNS, [place] [phone])

## 2019-07-06 LAB — COMPREHENSIVE METABOLIC PANEL
ALT: 11 U/L (ref 0–44)
AST: 16 U/L (ref 15–41)
Albumin: 2.1 g/dL — ABNORMAL LOW (ref 3.5–5.0)
Alkaline Phosphatase: 57 U/L (ref 38–126)
Anion gap: 5 (ref 5–15)
BUN: 25 mg/dL — ABNORMAL HIGH (ref 6–20)
CO2: 22 mmol/L (ref 22–32)
Calcium: 8.5 mg/dL — ABNORMAL LOW (ref 8.9–10.3)
Chloride: 111 mmol/L (ref 98–111)
Creatinine, Ser: 0.89 mg/dL (ref 0.44–1.00)
GFR calc Af Amer: >60 mL/min (ref >60)
GFR calc non Af Amer: >60 mL/min (ref >60)
Glucose, Bld: 113 mg/dL — ABNORMAL HIGH (ref 70–99)
Potassium: 3.8 mmol/L (ref 3.5–5.1)
Sodium: 138 mmol/L (ref 135–145)
Total Bilirubin: 0.6 mg/dL (ref 0.3–1.2)
Total Protein: 6.8 g/dL (ref 6.5–8.1)

## 2019-07-06 LAB — CBC
HCT: 29.4 % — ABNORMAL LOW (ref 36.0–46.0)
Hemoglobin: 9.5 g/dL — ABNORMAL LOW (ref 12.0–15.0)
MCH: 30.2 pg (ref 26.0–34.0)
MCHC: 32.3 g/dL (ref 30.0–36.0)
MCV: 93.3 fL (ref 80.0–100.0)
Platelets: 246 10*3/uL (ref 150–400)
RBC: 3.15 MIL/uL — ABNORMAL LOW (ref 3.87–5.11)
RDW: 16.7 % — ABNORMAL HIGH (ref 11.5–15.5)
WBC: 5.9 10*3/uL (ref 4.0–10.5)
nRBC: 0 % (ref 0.0–0.2)

## 2019-07-06 LAB — IRON AND TIBC
Iron: 27 ug/dL — ABNORMAL LOW (ref 28–170)
Saturation Ratios: 17 % (ref 10.4–31.8)
TIBC: 163 ug/dL — ABNORMAL LOW (ref 250–450)
UIBC: 136 ug/dL

## 2019-07-06 LAB — TRIGLYCERIDES: Triglycerides: 97 mg/dL (ref <150)

## 2019-07-06 LAB — PHOSPHORUS: Phosphorus: 3.9 mg/dL (ref 2.5–4.6)

## 2019-07-06 LAB — DIFFERENTIAL
Abs Immature Granulocytes: 0.02 10*3/uL (ref 0.00–0.07)
Basophils Absolute: 0 10*3/uL (ref 0.0–0.1)
Basophils Relative: 0 %
Eosinophils Absolute: 0.2 10*3/uL (ref 0.0–0.5)
Eosinophils Relative: 4 %
Immature Granulocytes: 0 %
Lymphocytes Relative: 32 %
Lymphs Abs: 1.9 10*3/uL (ref 0.7–4.0)
Monocytes Absolute: 1 10*3/uL (ref 0.1–1.0)
Monocytes Relative: 17 %
Neutro Abs: 2.8 10*3/uL (ref 1.7–7.7)
Neutrophils Relative %: 47 %

## 2019-07-06 LAB — RETICULOCYTES
Immature Retic Fract: 11.3 % (ref 2.3–15.9)
RBC.: 3.22 MIL/uL — ABNORMAL LOW (ref 3.87–5.11)
Retic Count, Absolute: 52.2 10*3/uL (ref 19.0–186.0)
Retic Ct Pct: 1.6 % (ref 0.4–3.1)

## 2019-07-06 LAB — PREALBUMIN: Prealbumin: 10.7 mg/dL — ABNORMAL LOW (ref 18–38)

## 2019-07-06 LAB — FOLATE: Folate: 28 ng/mL (ref >5.9)

## 2019-07-06 LAB — MAGNESIUM: Magnesium: 1.7 mg/dL (ref 1.7–2.4)

## 2019-07-06 LAB — VITAMIN B12: Vitamin B-12: 721 pg/mL (ref 180–914)

## 2019-07-06 LAB — GLUCOSE, CAPILLARY
Glucose-Capillary: 94 mg/dL (ref 70–99)
Glucose-Capillary: 122 mg/dL — ABNORMAL HIGH (ref 70–99)
Glucose-Capillary: 113 mg/dL — ABNORMAL HIGH (ref 70–99)
Glucose-Capillary: 119 mg/dL — ABNORMAL HIGH (ref 70–99)

## 2019-07-06 LAB — FERRITIN: Ferritin: 377 ng/mL — ABNORMAL HIGH (ref 11–307)

## 2019-07-06 MED ORDER — INSULIN ASPART 100 UNIT/ML ~~LOC~~ SOLN
0.0000 [IU] | Freq: Four times a day (QID) | SUBCUTANEOUS | Status: DC
  Administered 2019-07-06: 1 [IU] via SUBCUTANEOUS
  Administered 2019-07-07 – 2019-07-08 (×4): 2 [IU] via SUBCUTANEOUS

## 2019-07-06 MED ORDER — TRAVASOL 10 % IV SOLN
INTRAVENOUS | Status: AC
  Filled 2019-07-06: qty 900

## 2019-07-06 MED ORDER — NYSTATIN 100000 UNIT/ML MT SUSP
5.0000 mL | Freq: Four times a day (QID) | OROMUCOSAL | Status: DC
  Administered 2019-07-06 – 2019-07-08 (×5): 500000 [IU] via ORAL
  Filled 2019-07-06 (×6): qty 5

## 2019-07-06 MED ORDER — PANTOPRAZOLE SODIUM 40 MG IV SOLR
40.0000 mg | Freq: Two times a day (BID) | INTRAVENOUS | Status: DC
  Administered 2019-07-06 – 2019-07-08 (×5): 40 mg via INTRAVENOUS
  Filled 2019-07-06 (×5): qty 40

## 2019-07-06 NOTE — Progress Notes (Signed)
This shift pt's family member brought  [place] wound pouch package to hospital.

## 2019-07-06 NOTE — Evaluation (Signed)
Physical Therapy Evaluation Patient Details Name: Gina [name] MRN: [id number] DOB: 1961/01/10 Today's Date: 07/06/2019   History of Present Illness  59 yo female admitted from home with anemia. Hx of bil BKA, DM, schizophrenia, HTN, multiple abdominal drains 2* previous complicated hospital admission, bacteremia  Clinical Impression  On eval, pt required Mod assist +2 for mobility. She was able to participate in performing an A-P transfer from bed to recliner. Pt presents with general weakness, decreased activity tolerance, and impaired balance. She reports she has required significant assistance for mobility and ADLs at baseline most recently. At start of session, pt stated "Im not going back to that rehab place." She plans to return home with family assisting as needed. Recommend HHPT and a home health aide (if possible).     Follow Up Recommendations Home health PT;Supervision/Assistance - 24 hour; Home health Aide    Equipment Recommendations  None recommended by PT    Recommendations for Other Services       Precautions / Restrictions Precautions Precautions: Fall Precaution Comments: bil BKA, multiple drains Restrictions Weight Bearing Restrictions: No      Mobility  Bed Mobility Overal bed mobility: Needs Assistance Bed Mobility: Supine to Sit     Supine to sit: Mod assist;+2 for safety/equipment     General bed mobility comments: modA to progress trunk upright and scoot hips  Transfers Overall transfer level: Needs assistance Equipment used: None Transfers: Comptroller transfers: Mod assist;+2 safety/equipment;+2 physical assistance   General transfer comment: Assist to stabilize pt and scoot into recliner. Increased time. Cues for technique and for pt to do as much as she could.  Ambulation/Gait                Stairs            Wheelchair Mobility    Modified Rankin (Stroke Patients Only)        Balance Overall balance assessment: Needs assistance Sitting-balance support: Bilateral upper extremity supported Sitting balance-Leahy Scale: Fair Sitting balance - Comments: reliant on BUE support                                     Pertinent Vitals/Pain Pain Assessment: Faces Faces Pain Scale: Hurts little more Pain Location: buttocks (per pt, has wound/pressure ulcer) Pain Descriptors / Indicators: Grimacing;Discomfort;Tender Pain Intervention(s): Limited activity within patient's tolerance;Monitored during session;Repositioned    Home Living Family/patient expects to be discharged to:: Private residence Living Arrangements: Children Available Help at Discharge: Family;Available 24 hours/day Type of Home: Apartment Home Access: Level entry     Home Layout: One level Home Equipment: Walker - 2 wheels;Wheelchair - manual;Hospital bed;Tub bench Additional Comments: pt from SNF, now pt and family wanting pt to return home with family. Per chart, family will be with pt and providing assist as needed.    Prior Function Level of Independence: Needs assistance   Gait / Transfers Assistance Needed: family assists with transfers to w/c   ADL's / Homemaking Assistance Needed: daughter helps with dressing/bathing        Hand Dominance   Dominant Hand: Right    Extremity/Trunk Assessment   Upper Extremity Assessment Upper Extremity Assessment: LUE deficits/detail;Generalized weakness LUE Deficits / Details: 3rd digit amputation, DIP/PIP in slight flexion, grip strength 3/5, LUE grossly 3/5  LUE Coordination: decreased gross motor;decreased fine motor    Lower Extremity Assessment Lower  Extremity Assessment: (hip flex at least 3/5. knee ext ~5-10 degrees from 0)    Cervical / Trunk Assessment Cervical / Trunk Assessment: Kyphotic  Communication   Communication: No difficulties  Cognition Arousal/Alertness: Awake/alert Behavior During Therapy: WFL for  tasks assessed/performed Overall Cognitive Status: Within Functional Limits for tasks assessed                                        General Comments General comments (skin integrity, edema, etc.): vss    Exercises     Assessment/Plan    PT Assessment Patient needs continued PT services  PT Problem List Decreased strength;Decreased mobility;Decreased activity tolerance;Decreased balance;Decreased knowledge of use of DME;Pain;Decreased skin integrity;Decreased safety awareness       PT Treatment Interventions Therapeutic activities;Therapeutic exercise;DME instruction;Patient/family education;Balance training;Functional mobility training    PT Goals (Current goals can be found in the Care Plan section)  Acute Rehab PT Goals Patient Stated Goal: to go home, pt reports she does not want to go to SNF PT Goal Formulation: With patient Time For Goal Achievement: 07/20/19 Potential to Achieve Goals: Fair    Frequency Min 3X/week   Barriers to discharge        Co-evaluation   Reason for Co-Treatment: For patient/therapist safety;To address functional/ADL transfers   OT goals addressed during session: ADL's and self-care       AM-PAC PT "6 Clicks" Mobility  Outcome Measure Help needed turning from your back to your side while in a flat bed without using bedrails?: A Lot Help needed moving from lying on your back to sitting on the side of a flat bed without using bedrails?: A Lot Help needed moving to and from a bed to a chair (including a wheelchair)?: A Lot Help needed standing up from a chair using your arms (e.g., wheelchair or bedside chair)?: Total Help needed to walk in hospital room?: Total Help needed climbing 3-5 steps with a railing? : Total 6 Click Score: 9    End of Session   Activity Tolerance: Patient tolerated treatment well Patient left: in chair;with call bell/phone within reach;with chair alarm set   PT Visit Diagnosis: Muscle weakness  (generalized) (M62.81);History of falling (Z91.81)    Time: 1470-9295 PT Time Calculation (min) (ACUTE ONLY): 22 min   Charges:   PT Evaluation $PT Eval Moderate Complexity: 1 Mod             Verda Mehta P, PT Acute Rehabilitation

## 2019-07-06 NOTE — Consult Note (Signed)
Re:   Gina [name] DOB:   February 09, 1961 MRN:   [id number]  ASSESEMENT AND PLAN: 1.  Anemia  Admission hgb - 6.6 - transfused to 9.5 2.  Malnutrition - prealbumin - 10.7 - 07/06/2019  On home TPN 3.  Enterocutaneous fistula  History of perforated pyloric channel ulcer, oversewed with [name] patch - 02/03/2019 - [facility]  Has pouch around fistula  It looks like the pouch is holding up well.  Will get ostomy nurses to review wound.  4.  IR drain in LLQ - ?02/26/2020  This drain lays under the abdominal wall and is draining the same area as the enterocutaneous fistula - ?whether this is adding anything to her care  5.  ID DM 6.  Bilateral BKA 7.  Schizophrenia 8.  Sacral decubiti  Chief Complaint  Patient presents with  . Emesis  . Nausea  . Abnormal Lab   PHYSICIAN REQUESTING CONSULTATION:  Dr. Lindie Spruce  HISTORY OF PRESENT ILLNESS: Gina [name] is a 58 y.o. (DOB: 07-Jan-1961)  AA female whose primary care physician is [name], MD.   She is seen at [facility].  She saw Dr. Arline Asp, but she said that he has left.  She came to the [facility] on 07/04/2019 with severe anemia and some nausea and vomiting.  And she was admitted for her anemia and symptoms.  She has a complex GI history in that she had a perforated ulcer treated with oversewing the ulcer and a Graham patch on 02/03/2019 by Dr. Lucia Gaskins.  Of note, she presented in November with uncontrolled DM with a blood sugar of 1,179.  Her post op course was complicated.  It looks like she developed a enterocutaneous fistula post op - noticed in December.  There were attempts to perc drain this, but she now has an open fistula in her upper midline.  She is medically and mentally much more stable that she was in [place].    Past Medical History:  Diagnosis Date  . Diabetes mellitus   . Hypertension   . Osteomyelitis of right foot ([place]) 02/22/2017  . Perforated gastric ulcer ([place])   . S/P bilateral BKA (below  knee amputation) ([place])   . Schizophrenia ([place])   . Schizophrenia ([place])   . Septic arthritis of interphalangeal joint of toe of left foot ([place]) 06/02/2016  . Stress incontinence 04/26/2017      Past Surgical History:  Procedure Laterality Date  . AMPUTATION Left 06/05/2016   Procedure: LEFT FOOT TRANSMETATARSAL AMPUTATION;  Surgeon: [name], MD;  Location: [facility];  Service: Orthopedics;  Laterality: Left;  . AMPUTATION Right 11/11/2017   Procedure: AMPUTATION BELOW KNEE;  Surgeon: [name], MD;  Location: [place];  Service: Orthopedics;  Laterality: Right;  . AMPUTATION Left 05/10/2018   Procedure: LEFT BELOW KNEE AMPUTATION;  Surgeon: [name], MD;  Location: [place];  Service: Orthopedics;  Laterality: Left;  . AMPUTATION Left 12/28/2018   Procedure: AMPUTATION THIRD DIGIT;  Surgeon: Iran Planas, MD;  Location: [facility];  Service: Orthopedics;  Laterality: Left;  . AMPUTATION TOE Right 02/23/2017   Procedure: AMPUTATION RIGHT THIRD TOE;  Surgeon: [name], MD;  Location: [place];  Service: Orthopedics;  Laterality: Right;  . I & D EXTREMITY Right 11/09/2017   Procedure: Debride Ulcer Right Heel;  Surgeon: [name], MD;  Location: [place];  Service: Orthopedics;  Laterality: Right;  . I & D EXTREMITY Left 12/28/2018   Procedure:  IRRIGATION AND DEBRIDEMENT EXTREMITY;  Surgeon: Iran Planas, MD;  Location: [facility];  Service: Orthopedics;  Laterality: Left;  . INCISION AND DRAINAGE OF WOUND Left 12/26/2018   Procedure: IRRIGATION AND DEBRIDEMENT LEFT HAND;  Surgeon: Iran Planas, MD;  [facility];  Service: Orthopedics;  Laterality: Left;  . LAPAROTOMY N/A 02/02/2019   Procedure: EXPLORATORY LAPAROTOMY WITH OVERSEW OF DUODENAL ULCER;  Surgeon: Alphonsa Overall, MD;  Location: [facility];  Service: General;  Laterality: N/A;  . TEE WITHOUT CARDIOVERSION N/A 05/27/2019   Procedure: TRANSESOPHAGEAL ECHOCARDIOGRAM (TEE);  Surgeon: Skeet Latch, MD;  Location: [place];   Service: Cardiovascular;  Laterality: N/A;  . TUBAL LIGATION        Current Facility-Administered Medications  Medication Dose Route Frequency Provider Last Rate Last Admin  . 0.9 %  sodium chloride infusion (Manually program via Guardrails IV Fluids)   Intravenous Once Elwyn Reach, MD      . Chlorhexidine Gluconate Cloth 2 % PADS 6 each  6 each Topical Daily Elwyn Reach, MD   6 each at 07/06/19 [phone]  . dextrose 5 %-0.9 % sodium chloride infusion   Intravenous Continuous Lovey Newcomer T, NP 75 mL/hr at 07/06/19 0835 New Bag at 07/06/19 0835  . insulin aspart (novoLOG) injection 0-9 Units  0-9 Units Subcutaneous Q6H Wofford, Cindie Laroche, RPH      . morphine 2 MG/ML injection 2 mg  2 mg Intravenous Q4H PRN Elwyn Reach, MD   2 mg at 07/06/19 9604  . ondansetron (ZOFRAN) tablet 4 mg  4 mg Oral Q6H PRN Elwyn Reach, MD       Or  . ondansetron (ZOFRAN) injection 4 mg  4 mg Intravenous Q6H PRN Elwyn Reach, MD   4 mg at 07/06/19 5409  . pantoprazole (PROTONIX) injection 40 mg  40 mg Intravenous Q12H Brahmbhatt, Parag, MD      . sodium chloride flush (NS) 0.9 % injection 10-40 mL  10-40 mL Intracatheter Q12H Gala Romney L, MD   10 mL at 07/06/19 0834  . sodium chloride flush (NS) 0.9 % injection 10-40 mL  10-40 mL Intracatheter PRN Elwyn Reach, MD      . TPN ADULT (ION)   Intravenous Continuous TPN Wofford, Drew A, RPH          Allergies  Allergen Reactions  . Bee Venom Swelling    Swells up with bee stings   . Metformin And Related Other (See Comments)    Upset stomach    REVIEW OF SYSTEMS: Skin:  Sacral decubiti Infection:  No history of hepatitis or HIV.  No history of MRSA. Neurologic:  No history of stroke.  No history of seizure.  No history of headaches. Cardiac:  Had an echo 05/27/2019 - EF - 55-60% Pulmonary:  Does not smoke cigarettes.  No asthma or bronchitis.  No OSA/CPAP.  Endocrine:  IDDM Gastrointestinal:  See HPI Urologic:  No history of  kidney stones.  No history of bladder infections. Musculoskeletal:  Bilateral BKA from poorly controlled DM Hematologic:  Admitted with anemia Psycho-social:  The patient is oriented.     SOCIAL and FAMILY HISTORY: Daughter, Tynleigh Birt and son take care of her at home.  IT looks like they have done a good job.  PHYSICAL EXAM: BP (!) 159/76 (BP Location: Left Arm)   Pulse 74   Temp 98 F (36.7 C) (Oral)   Resp 20   LMP 03/29/2015   SpO2 100%  General: AA F who is alert. Skin:  Inspection and palpation - no mass or rash. Eyes:  Conjunctiva and lids unremarkable.            Pupils are equal Ears, Nose, Mouth, and Throat:  Ears and nose unremarkable             Neck: Supple. No mass, trachea midline.  No thyroid mass. Lymph Nodes:  No supraclavicular, cervical, or inguinal nodes. Lungs: Normal respiratory effort.  Clear to auscultation and symmetric breath sounds. Heart:  Palpation of the heart is normal.            Auscultation: RRR. No murmur or rub.  Abdomen: Soft. Upper midline enterocutaneous fistula.  LLQ drain. Rectal: Not done. Musculoskeletal:  Bilateral BKA.  She has some breakdown of the skin on both BKA, but this should heal with local wound care.  Neurologic:  Grossly intact to motor and sensory function. Psychiatric: Normal judgement and insight.             Oriented to time, person, place.    Abdomen with enterocutaneous fistula   BKA stumps  DATA REVIEWED, COUNSELING AND COORDINATION OF CARE: Epic notes reviewed. I have personally seen and evaluated the patient, evaluated laboratory and imaging results, formulated the assessment and plan and placed orders. This requires high medical decision making. Total time spent with patient and charting: 60 minutes This is an admission  24 observation  consultation.  Alphonsa Overall, MD,  [facility], [place]  [place], [place]    [place] Phone:   [phone] FAX:  [phone]

## 2019-07-06 NOTE — Progress Notes (Signed)
PROGRESS NOTE    Sherita Decoste  PJK:[id number] DOB: Jun 18, 1960 DOA: 07/04/2019 PCP: [name], MD   Brief Narrative: 59 year old with past medical history significant for diabetes, hypertension, bilateral BKA, perforated gastric ulcer, osteomyelitis, schizophrenia, recurrent DKA's, history of peritonsillar abscess, history of MSSA bacteremia who presents to the ER complaining of abdominal pain and nausea.  She is having pain more in the epigastric area.  Also patient presented with a drop in hemoglobin at 6.6 on admission.  Occult blood positive.  She recently completed treatment for MRSA bacteremia March of this year.  She has a prior history of acute peritonitis with exploratory laparotomy done with Grahan patch November last year.  Subsequently had Candida glabrata blood cultures and peritoneal fluid also showing Candida.  She had IR drainage.   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  Received 2 units of packed red blood cell.  Hemoglobin increased to 9. GI consulted, no overt GI bleeding.  GI recommends CT abdomen and pelvis to evaluate abdominal pain.  No plan for endoscopy at this point. Check anemia panel.  Hb stable.   2-Hypertension: Continue to hold medication due to severe anemia and concern for GI bleed.  3-Prior history of acute peritonitis status post exploratory laparotomy with [name] patch November 8099, complicated by enterocutaneous fistula and currently has IR guided drain placement and  Eakins over the central abdomen: -CT abdomen and pelvis; Redemonstrated flat, elongated air and contrast containing ventral peritoneal cavity, with a left approach percutaneous pigtail drainage catheter. There is a fistula to the skin with overlying  wound dressing and the evident nidus of contrast is small bowel in the left hemiabdomen. -Surgery consulted.  -Surgery consulted.   4-Bilateral BKA: With wound.  Wound care consulted. 5-History of MRSA bacteremia completed antibiotic March 25. Pressure injury Coccyx Bilateral. Wound care consulted.  6-Severe Malnutrition; resume TPN.   Pressure Injury 05/22/19 Coccyx Bilateral Stage 2 -  Partial thickness loss of dermis presenting as a shallow open injury with a red, pink wound bed without slough. (Active)  05/22/19 2104  Location: Coccyx  Location Orientation: Bilateral  Staging: Stage 2 -  Partial thickness loss of dermis presenting as a shallow open injury with a red, pink wound bed without slough.  Wound Description (Comments):   Present on Admission: Yes     Pressure Injury 05/22/19 Knee Right;Distal Unstageable - Full thickness tissue loss in which the base of the injury is covered by slough (yellow, tan, gray, green or brown) and/or eschar (tan, brown or black) in the wound bed. Stump (Active)  05/22/19 2106  Location: Knee  Location Orientation: Right;Distal  Staging: Unstageable - Full thickness tissue loss in which the base of the injury is covered by slough (yellow, tan, gray, green or brown) and/or eschar (tan, brown or black) in the wound bed.  Wound Description (Comments): Stump  Present on Admission: Yes     Pressure Injury 05/22/19 Knee Left;Distal Unstageable - Full thickness tissue loss in which the base of the injury is covered by slough (yellow, tan, gray, green or brown) and/or eschar (tan, brown or black) in the wound bed. Stump (Active)  05/22/19 2108  Location: Knee  Location Orientation: Left;Distal  Staging: Unstageable - Full thickness tissue loss in which the  base of the injury is covered by slough (yellow, tan, gray, green or brown) and/or eschar (tan, brown or black) in the wound bed.  Wound Description (Comments): Stump  Present on Admission: Yes       Pressure Injury 05/22/19 Thigh Left;Mid Stage 2 -  Partial thickness loss of dermis presenting as a shallow open injury with a red, pink wound bed without slough. (Active)  05/22/19 2109  Location: Thigh  Location Orientation: Left;Mid  Staging: Stage 2 -  Partial thickness loss of dermis presenting as a shallow open injury with a red, pink wound bed without slough.  Wound Description (Comments):   Present on Admission: Yes     Estimated body mass index is 24.6 kg/m as calculated from the following:   Height as of 06/28/19: 5\' 2"  (1.575 m).   Weight as of 06/28/19: 61 kg.   DVT prophylaxis: SCD Code Status: DNR Family Communication: Try to contact daughter  Disposition Plan:  Patient is from: Home Anticipated d/c date: 2-3 days depending on Ct scan results.  Barriers to d/c or necessity for inpatient status: patient with GI bleed, monitor for recurrent bleeding, abdominal pain undergoing evaluation.   Consultants:   GI  Procedures:   None  Antimicrobials:    Subjective: Alert, feeling better.  Denies worsening abdominal pain.   Objective: Vitals:   07/05/19 2044 07/06/19 0032 07/06/19 0423 07/06/19 1421  BP: (!) 173/86 (!) 146/75 (!) 159/76 (!) 142/91  Pulse: 77 70 74 78  Resp: 20  20 16   Temp: 97.8 F (36.6 C)  98 F (36.7 C) 98.2 F (36.8 C)  TempSrc: Oral  Oral Oral  SpO2: 100%  100% 99%    Intake/Output Summary (Last 24 hours) at 07/06/2019 1503 Last data filed at 07/06/2019 1300 Gross per 24 hour  Intake 1425.03 ml  Output 2200 ml  Net -774.97 ml   There were no vitals filed for this visit.  Examination:  General exam: Chronic ill appearing Respiratory system: Clear to auscultation. Respiratory effort normal. Cardiovascular system: S1 & S2 heard, RRR. No JVD, murmurs, rubs, gallops or clicks. No pedal edema. Gastrointestinal system: Abdomen soft, drain left side, midline eakin wound drainage.  Central nervous system: Alert  Extremities: BL  BKA   Data Reviewed: I have personally reviewed following labs and imaging studies  CBC: Recent Labs  Lab 07/03/19 2050 07/03/19 2050 07/04/19 1647 07/05/19 0117 07/05/19 0320 07/05/19 0840 07/06/19 0315  WBC 5.9  --  9.6 6.9  --  7.9 5.9  NEUTROABS 2.8  --  7.3  --   --   --  2.8  HGB 6.7*   < > 6.6* 9.5* 9.4* 9.6* 9.5*  HCT 21.2*   < > 21.0* 29.9* 29.7* 29.8* 29.4*  MCV 95.1  --  96.3 92.9  --  92.3 93.3  PLT 327  --  300 268  --  275 246   < > = values in this interval not displayed.   Basic Metabolic Panel: Recent Labs  Lab 07/03/19 2050 07/04/19 1647 07/05/19 0320 07/06/19 0315  NA 134* 139 138 138  K 3.9 4.0 4.3 3.8  CL 102 106 107 111  CO2 26 25 23 22   GLUCOSE 124* 135* 98 113*  BUN 34* 43* 40* 25*  CREATININE 0.70 0.91 0.92 0.89  CALCIUM 8.6* 8.8* 8.6* 8.5*  MG 1.7  --   --  1.7  PHOS 4.1  --   --  3.9   GFR: Estimated Creatinine  Clearance: 59.3 mL/min (by C-G formula based on SCr of 0.89 mg/dL). Liver Function Tests: Recent Labs  Lab 07/03/19 2050 07/04/19 1647 07/05/19 0320 07/06/19 0315  AST 18 15 14* 16  ALT 14 13 11 11   ALKPHOS 76 67 61 57  BILITOT 0.4 0.5 0.8 0.6  PROT 7.0 7.6 7.2 6.8  ALBUMIN 2.2* 2.5* 2.3* 2.1*   No results for input(s): LIPASE, AMYLASE in the last 168 hours. No results for input(s): AMMONIA in the last 168 hours. Coagulation Profile: No results for input(s): INR, PROTIME in the last 168 hours. Cardiac Enzymes: No results for input(s): CKTOTAL, CKMB, CKMBINDEX, TROPONINI in the last 168 hours. BNP (last 3 results) No results for input(s): PROBNP in the last 8760 hours. HbA1C: No results for input(s): HGBA1C in the last 72 hours. CBG: Recent Labs  Lab 07/05/19 2142 07/05/19 2217 07/05/19 2357 07/06/19 0806 07/06/19 1210  GLUCAP 68* 83 109* 94 122*   Lipid Profile: Recent Labs    07/06/19 0315  TRIG 97   Thyroid Function Tests: No results for input(s): TSH, T4TOTAL, FREET4, T3FREE, THYROIDAB in the last  72 hours. Anemia Panel: Recent Labs    07/06/19 1250  VITAMINB12 721  FOLATE 28.0  FERRITIN 377*  TIBC 163*  IRON 27*  RETICCTPCT 1.6   Sepsis Labs: No results for input(s): PROCALCITON, LATICACIDVEN in the last 168 hours.  Recent Results (from the past 240 hour(s))  Respiratory Panel by RT PCR (Flu A&B, Covid) - Nasopharyngeal Swab     Status: None   Collection Time: 07/04/19  6:40 PM   Specimen: Nasopharyngeal Swab  Result Value Ref Range Status   SARS Coronavirus 2 by RT PCR NEGATIVE NEGATIVE Final    Comment: (NOTE) SARS-CoV-2 target nucleic acids are NOT DETECTED. The SARS-CoV-2 RNA is generally detectable in upper respiratoy specimens during the acute phase of infection. The lowest concentration of SARS-CoV-2 viral copies this assay can detect is 131 copies/mL. A negative result does not preclude SARS-Cov-2 infection and should not be used as the sole basis for treatment or other patient management decisions. A negative result may occur with  improper specimen collection/handling, submission of specimen other than nasopharyngeal swab, presence of viral mutation(s) within the areas targeted by this assay, and inadequate number of viral copies (<131 copies/mL). A negative result must be combined with clinical observations, patient history, and epidemiological information. The expected result is Negative. Fact Sheet for Patients:  PinkCheek.be Fact Sheet for Healthcare Providers:  GravelBags.it This test is not yet ap proved or cleared by the [place] FDA and  has been authorized for detection and/or diagnosis of SARS-CoV-2 by FDA under an Emergency Use Authorization (EUA). This EUA will remain  in effect (meaning this test can be used) for the duration of the COVID-19 declaration under Section 564(b)(1) of the Act, 21 U.S.C. section 360bbb-3(b)(1), unless the authorization is terminated or revoked sooner.     Influenza A by PCR NEGATIVE NEGATIVE Final   Influenza B by PCR NEGATIVE NEGATIVE Final    Comment: (NOTE) The Xpert Xpress SARS-CoV-2/FLU/RSV assay is intended as an aid in  the diagnosis of influenza from Nasopharyngeal swab specimens and  should not be used as a sole basis for treatment. Nasal washings and  aspirates are unacceptable for Xpert Xpress SARS-CoV-2/FLU/RSV  testing. Fact Sheet for Patients: PinkCheek.be Fact Sheet for Healthcare Providers: GravelBags.it This test is not yet approved or cleared by the [place] FDA and  has been authorized for detection and/or  diagnosis of SARS-CoV-2 by  FDA under an Emergency Use Authorization (EUA). This EUA will remain  in effect (meaning this test can be used) for the duration of the  Covid-19 declaration under Section 564(b)(1) of the Act, 21  U.S.C. section 360bbb-3(b)(1), unless the authorization is  terminated or revoked. Performed at [facility], [place] [address], [place], [place] [address]   Urine Culture     Status: Abnormal (Preliminary result)   Collection Time: 07/05/19  7:20 AM   Specimen: Urine, Random  Result Value Ref Range Status   Specimen Description   Final    URINE, RANDOM Performed at [facility], [place] [address] [place], [place] [address]    Special Requests   Final    NONE Performed at [facility], [place] [address], [place], [place] [address]    Culture (A)  Final    >=100,000 COLONIES/mL PROTEUS MIRABILIS SUSCEPTIBILITIES TO FOLLOW Performed at [facility], [place] [address] [place], [place] [address]    Report Status PENDING  Incomplete         Radiology Studies: CT ABDOMEN PELVIS W CONTRAST  Result Date: 07/05/2019 CLINICAL DATA:  Anemia, history of perforated duodenal ulcer complicated by enteric cutaneous fistula EXAM: CT ABDOMEN AND PELVIS WITH CONTRAST TECHNIQUE:  Multidetector CT imaging of the abdomen and pelvis was performed using the standard protocol following bolus administration of intravenous contrast. CONTRAST:  118mL OMNIPAQUE IOHEXOL 300 MG/ML SOLN, additional oral enteric contrast COMPARISON:  05/22/2019, 04/11/2019 FINDINGS: Lower chest: Redemonstrated dense atelectasis or scarring of the right greater than left lung bases with a trace residual right pleural effusion, significantly decreased compared to prior examination. Hepatobiliary: No solid liver abnormality is seen. Gallstones and gravel in the gallbladder. Periportal edema, similar to prior examination. No significant biliary ductal dilatation. Pancreas: Unremarkable. No pancreatic ductal dilatation or surrounding inflammatory changes. Spleen: Normal in size without significant abnormality. Adrenals/Urinary Tract: Adrenal glands are unremarkable. Kidneys are normal, without renal calculi, solid lesion, or hydronephrosis. Foley catheter decompressing the urinary bladder. Thickened urinary bladder wall. Stomach/Bowel: Stomach is within normal limits. No evidence of bowel wall thickening, distention, or inflammatory changes. Large burden of stool throughout the colon. Vascular/Lymphatic: Aortic atherosclerosis. No enlarged abdominal or pelvic lymph nodes. Reproductive: No mass or other significant abnormality. Other: Redemonstrated flat, elongated air and contrast containing ventral peritoneal cavity, with a left approach percutaneous pigtail drainage catheter (series 2, image 43). There is fistula to the skin with overlying wound dressing. This cavity measures approximately 13.0 x 7.5 x 0.7 cm and is similar to prior examination. A previously seen right approach surgical drain has been removed. Evident nidus of contrast fistulation is to small bowel in the left hemiabdomen (series 2, image 49). Musculoskeletal: No acute or significant osseous findings. IMPRESSION: 1. Redemonstrated flat, elongated air and  contrast containing ventral peritoneal cavity, with a left approach percutaneous pigtail drainage catheter. There is a fistula to the skin with overlying wound dressing and the evident nidus of contrast is small bowel in the left hemiabdomen. 2. Periportal edema, similar to prior examination and of uncertain significance, possibly reactive to adjacent inflammation. 3.  Cholelithiasis. 4.  Aortic Atherosclerosis (ICD10-I70.0). Electronically Signed   By: Eddie Candle M.D.   On: 07/05/2019 16:03   DG Chest Port 1 View  Result Date: 07/04/2019 CLINICAL DATA:  Nausea and vomiting, anemia, PICC placement EXAM: PORTABLE CHEST 1 VIEW COMPARISON:  06/01/2019 FINDINGS: Single frontal view of the chest demonstrates  right-sided PICC tip overlying superior vena cava. The cardiac silhouette is unremarkable. No airspace disease, effusion, or pneumothorax. No acute bony abnormalities. IMPRESSION: 1. Right PICC overlying superior vena cava. 2. No acute intrathoracic process. Electronically Signed   By: Randa Ngo M.D.   On: 07/04/2019 16:36        Scheduled Meds: . sodium chloride   Intravenous Once  . Chlorhexidine Gluconate Cloth  6 each Topical Daily  . insulin aspart  0-9 Units Subcutaneous Q6H  . nystatin  5 mL Oral QID  . pantoprazole (PROTONIX) IV  40 mg Intravenous Q12H  . sodium chloride flush  10-40 mL Intracatheter Q12H   Continuous Infusions: . dextrose 5 % and 0.9% NaCl 75 mL/hr at 07/06/19 0835  . TPN ADULT (ION)       LOS: 2 days    Time spent: 35 minutes.     Elmarie Shiley, MD [facility]   If 7PM-7AM, please contact night-coverage [email or web address]  07/06/2019, 3:03 PM

## 2019-07-06 NOTE — Progress Notes (Signed)
PHARMACY - TOTAL PARENTERAL NUTRITION CONSULT NOTE   Indication: Fistula  Patient Measurements:     There is no height or weight on file to calculate BMI. Height: 62 inches Weight: 61 kg Usual Weight:   Assessment: 59 yo female hx of perforated peptic ulcer s/p [name] patch Nov 3662 complicated by [facility] fistula s/p IR drainage now on chronic TPN since early 2021. PMH includes DM2, HTN, osteomyelitis with bilateral BKA's, schizophrenia, recent admissions for fungal peritonitis and MRSA bacteremia requiring PICC replacement. Now admitted for Hgb 6.6 with occult blood positive stool. Pharmacy consulted to continue managing home TPN.  She has not been on TPN since admission on 4/23.  Glucose / Insulin: Hx DM, A1c 6.1 on 06/28/19. CBGs 83-98 prior to resuming TPN. Electrolytes: All wnl Renal: SCr, BUN, bicarb all wnl; UOP charted < 24 hr Hepatic:  - LFTs wnl; albumin low (4/25) - TG wnl; Prealbumin 10.7 on admission (4/25) I/O:  - MIVF: D5/NS at 75 ml/hr GI Imaging:  4/23 CT abd: There is a fistula to the skin with overlying wound dressing and the evident nidus of contrast is small bowel in the left hemiabdomen. Cholelithiasis. Surgeries / Procedures:   Central access: PICC placed 05/29/19 TPN start date: chronic  Per review of home TPN records provided by [facility]: 1829ml cycled over 14hrs daily to provide 1472 kcals/day Protein 90g Dextrose 180g Lipids 50g  Nutritional Goals  RD recs: pending Current TPN at 75 ml/hr is equivalent to patient's home regimen above  Current Nutrition:  Clear liquids  Plan: *Consult received after [name] compounding deadline of 12:00, therefore, TPN will not start until 4/25*   Start TPN at 75 mL/hr at 1800 tonight; will revert to 24-hr TPN while acutely ill to avoid significant fluid shifts; can transition back to 14 hr cyclic TPN over 48 hr once clinically stable  Will do 1-hr ramp-up since patient off TPN for > 24 hr  Electrolytes in  TPN:   - Na 39mEq/L, K 52mEq/L, Ca 56mEq/L, Mg 61mEq/L, Phos 71mmol/L  - Cl:Ac ratio 1:1  Add standard MVI and trace elements to TPN (can usually take PO meds; leave in TPN for now during GIB workup)  Start Sensitive SSI with q6 hr CBG checks   Stop MIVF at 1800  Monitor TPN labs on Mon/Thurs, CMET, Mg and Phos in AM  Reuel Boom, PharmD, BCPS [phone] 07/06/2019, 9:03 AM

## 2019-07-06 NOTE — Progress Notes (Signed)
[facility] Progress Note  Gina [name] 59 y.o. 11-06-1960  CC: Anemia   Subjective: Patient seen and examined at bedside.  Feeling somewhat better.  CT scan report from yesterday discussed.  Patient also discussed with RN.  She continues to have generalized abdominal discomfort and nausea according to RN requiring antiemetic and pain medication.  ROS : Afebrile.  Negative for chest pain   Objective: Vital signs in last 24 hours: Vitals:   07/06/19 0032 07/06/19 0423  BP: (!) 146/75 (!) 159/76  Pulse: 70 74  Resp:  20  Temp:  98 F (36.7 C)  SpO2:  100%    Physical Exam:  Elderly-appearing patient, NAD  Cardiovascular: Normal rate and normal heart sounds.  Pulmonary/Chest: Effort normal. No respiratory distress.  Abdominal: Soft. Midline Eakin wound drainage with brown color fecal material noted. abdomen is soft , NT ,   Bowel sounds present  Musculoskeletal:  Bilateral BKA.     Lab Results: Recent Labs    07/03/19 2050 07/04/19 1647 07/05/19 0320 07/06/19 0315  NA 134*   < > 138 138  K 3.9   < > 4.3 3.8  CL 102   < > 107 111  CO2 26   < > 23 22  GLUCOSE 124*   < > 98 113*  BUN 34*   < > 40* 25*  CREATININE 0.70   < > 0.92 0.89  CALCIUM 8.6*   < > 8.6* 8.5*  MG 1.7  --   --  1.7  PHOS 4.1  --   --  3.9   < > = values in this interval not displayed.   Recent Labs    07/05/19 0320 07/06/19 0315  AST 14* 16  ALT 11 11  ALKPHOS 61 57  BILITOT 0.8 0.6  PROT 7.2 6.8  ALBUMIN 2.3* 2.1*   Recent Labs    07/04/19 1647 07/05/19 0117 07/05/19 0840 07/06/19 0315  WBC 9.6   < > 7.9 5.9  NEUTROABS 7.3  --   --  2.8  HGB 6.6*   < > 9.6* 9.5*  HCT 21.0*   < > 29.8* 29.4*  MCV 96.3   < > 92.3 93.3  PLT 300   < > 275 246   < > = values in this interval not displayed.   No results for input(s): LABPROT, INR in the last 72 hours.    Assessment/Plan: -Anemia with occult blood positive stool.  No overt bleeding. -history of perforated peptic  ulcer with exploratory laparotomy and oversew of duodenal ulcer with [name] patch in November 3244 which was complicated by enterocutaneous fistula and currently has IR guided drain placement as well as Eakins over the central abdomen -History of osteomyelitis -Bilateral BKA   Recommendations ----------------------- -There is no evidence of overt GI bleed.  Hemoglobin stable. -CT scan yesterday showed no changes -Surgery consult for further evaluation and management of enterocutaneous fistula which has  high output according to nursing staff -Change PPI to IV twice daily  -No plan for endoscopic intervention in absence of overt bleeding. -GI will sign off.  Call us back if needed   Otis Brace MD, [place] 07/06/2019, 9:56 AM  Contact #  [phone]

## 2019-07-06 NOTE — Evaluation (Signed)
Occupational Therapy Evaluation Patient Details Name: Gina [name] MRN: [id number] DOB: 12-Jul-1960 Today's Date: 07/06/2019    History of Present Illness 59 yo female admitted from home with anemia. Hx of bil BKA, DM, schizophrenia, HTN, multiple abdominal drains 2* previous complicated hospital admission, bacteremia   Clinical Impression   PTA, pt was living at home with her children, pt had assistance from family for ADL/IADL and transfers to w/c. Pt currently requires modA+2 for anterior/posterior transfer to recliner. She demonstrates decreased activity tolerance, generalized weakness, and instability impacting her safety and independence with ADL/IADL and functional mobility. Due to decline in current level of function, pt would benefit from acute OT to address established goals to facilitate safe D/C to venue listed below. Pt adamantly declining SNF, reports her family is available to assist. At this time, recommend [facility] with 24/7 physical assistance follow-up. Will continue to follow acutely.     Follow Up Recommendations  Home health OT;Supervision/Assistance - 24 hour    Equipment Recommendations  3 in 1 bedside commode    Recommendations for Other Services       Precautions / Restrictions Precautions Precautions: Fall Precaution Comments: bil BKA, multiple drains Restrictions Weight Bearing Restrictions: No      Mobility Bed Mobility Overal bed mobility: Needs Assistance Bed Mobility: Supine to Sit     Supine to sit: Mod assist;+2 for safety/equipment     General bed mobility comments: modA to progress trunk upright and scoot hips  Transfers Overall transfer level: Needs assistance Equipment used: 2 person hand held assist Transfers: Comptroller transfers: Mod assist;+2 safety/equipment   General transfer comment: modA for safety     Balance Overall balance assessment: Needs assistance Sitting-balance support:  Bilateral upper extremity supported Sitting balance-Leahy Scale: Poor Sitting balance - Comments: reliant on BUE support                                   ADL either performed or assessed with clinical judgement   ADL Overall ADL's : Needs assistance/impaired Eating/Feeding: Set up;Sitting   Grooming: Set up;Sitting   Upper Body Bathing: Minimal assistance;Sitting   Lower Body Bathing: Moderate assistance;Sitting/lateral leans   Upper Body Dressing : Minimal assistance;Sitting   Lower Body Dressing: Moderate assistance;Sitting/lateral leans   Toilet Transfer: Moderate assistance;+2 for safety/equipment;+2 for physical assistance;Anterior/posterior Armed forces technical officer Details (indicate cue type and reason): simulated to recliner         Functional mobility during ADLs: Moderate assistance;+2 for safety/equipment General ADL Comments: anterior posterior transfer, poor sitting balance requries BUE support     Vision         Perception     Praxis      Pertinent Vitals/Pain Pain Assessment: Faces Faces Pain Scale: Hurts little more Pain Location: buttocks (per pt, has wound/pressure ulcer) Pain Descriptors / Indicators: Grimacing;Discomfort;Tender Pain Intervention(s): Limited activity within patient's tolerance;Monitored during session;Repositioned     Hand Dominance Right   Extremity/Trunk Assessment Upper Extremity Assessment Upper Extremity Assessment: LUE deficits/detail;Generalized weakness LUE Deficits / Details: 3rd digit amputation, DIP/PIP in slight flexion, grip strength 3/5, LUE grossly 3/5  LUE Coordination: decreased gross motor;decreased fine motor   Lower Extremity Assessment Lower Extremity Assessment: (hip flex at least 3/5. knee ext ~5-10 degrees from 0)   Cervical / Trunk Assessment Cervical / Trunk Assessment: Kyphotic   Communication Communication Communication: No difficulties   Cognition Arousal/Alertness:  Awake/alert Behavior During Therapy: WFL for tasks assessed/performed Overall Cognitive Status: Within Functional Limits for tasks assessed                                     General Comments  vss    Exercises     Shoulder Instructions      Home Living Family/patient expects to be discharged to:: Private residence Living Arrangements: Children Available Help at Discharge: Family;Available 24 hours/day Type of Home: Apartment Home Access: Level entry     Home Layout: One level     Bathroom Shower/Tub: Teacher, early years/pre: Standard Bathroom Accessibility: No   Home Equipment: Environmental consultant - 2 wheels;Wheelchair - manual;Hospital bed;Tub bench   Additional Comments: pt from SNF, now pt and family wanting pt to return home with family. Per chart, family will be with pt and providing assist as needed.      Prior Functioning/Environment Level of Independence: Needs assistance  Gait / Transfers Assistance Needed: family assists with transfers to w/c  ADL's / Homemaking Assistance Needed: daughter helps with dressing/bathing            OT Problem List: Decreased activity tolerance;Decreased strength;Impaired balance (sitting and/or standing);Decreased safety awareness;Decreased knowledge of use of DME or AE;Decreased knowledge of precautions;Pain      OT Treatment/Interventions: Self-care/ADL training;DME and/or AE instruction;Therapeutic activities;Therapeutic exercise;Patient/family education;Balance training    OT Goals(Current goals can be found in the care plan section) Acute Rehab OT Goals Patient Stated Goal: to go home, pt reports she does not want to go to SNF OT Goal Formulation: With patient Time For Goal Achievement: 07/20/19 Potential to Achieve Goals: Good ADL Goals Pt Will Perform Grooming: with set-up;sitting Pt Will Perform Upper Body Dressing: with set-up;sitting Additional ADL Goal #1: Pt will transfer to/from surfaces with  anterior/posterior or lateral scoot transfer with minA. Additional ADL Goal #2: Pt will progress to EOB with supervision in preparation for ADL.  OT Frequency: Min 2X/week   Barriers to D/C:            Co-evaluation PT/OT/SLP Co-Evaluation/Treatment: Yes Reason for Co-Treatment: For patient/therapist safety;To address functional/ADL transfers   OT goals addressed during session: ADL's and self-care      AM-PAC OT "6 Clicks" Daily Activity     Outcome Measure Help from another person eating meals?: A Little Help from another person taking care of personal grooming?: A Little Help from another person toileting, which includes using toliet, bedpan, or urinal?: A Lot Help from another person bathing (including washing, rinsing, drying)?: A Lot Help from another person to put on and taking off regular upper body clothing?: A Little Help from another person to put on and taking off regular lower body clothing?: A Lot 6 Click Score: 15   End of Session Nurse Communication: Mobility status  Activity Tolerance: Patient tolerated treatment well Patient left: in chair;with call bell/phone within reach;with chair alarm set  OT Visit Diagnosis: Other abnormalities of gait and mobility (R26.89);Muscle weakness (generalized) (M62.81);Pain Pain - part of body: (wounds )                Time: 2202-5427 OT Time Calculation (min): 23 min Charges:  OT General Charges $OT Visit: 1 Visit OT Evaluation $OT Eval Moderate Complexity: [place] OTR/L Acute Rehabilitation Services Office: [place] 07/06/2019, 1:51 PM

## 2019-07-07 LAB — DIFFERENTIAL
Abs Immature Granulocytes: 0.01 10*3/uL (ref 0.00–0.07)
Basophils Absolute: 0 10*3/uL (ref 0.0–0.1)
Basophils Relative: 0 %
Eosinophils Absolute: 0.1 10*3/uL (ref 0.0–0.5)
Eosinophils Relative: 2 %
Immature Granulocytes: 0 %
Lymphocytes Relative: 25 %
Lymphs Abs: 1.6 10*3/uL (ref 0.7–4.0)
Monocytes Absolute: 0.8 10*3/uL (ref 0.1–1.0)
Monocytes Relative: 12 %
Neutro Abs: 4 10*3/uL (ref 1.7–7.7)
Neutrophils Relative %: 61 %

## 2019-07-07 LAB — CBC
HCT: 28.6 % — ABNORMAL LOW (ref 36.0–46.0)
Hemoglobin: 9.1 g/dL — ABNORMAL LOW (ref 12.0–15.0)
MCH: 29.7 pg (ref 26.0–34.0)
MCHC: 31.8 g/dL (ref 30.0–36.0)
MCV: 93.5 fL (ref 80.0–100.0)
Platelets: 241 10*3/uL (ref 150–400)
RBC: 3.06 MIL/uL — ABNORMAL LOW (ref 3.87–5.11)
RDW: 16 % — ABNORMAL HIGH (ref 11.5–15.5)
WBC: 6.5 10*3/uL (ref 4.0–10.5)
nRBC: 0 % (ref 0.0–0.2)

## 2019-07-07 LAB — COMPREHENSIVE METABOLIC PANEL
ALT: 17 U/L (ref 0–44)
AST: 24 U/L (ref 15–41)
Albumin: 2 g/dL — ABNORMAL LOW (ref 3.5–5.0)
Alkaline Phosphatase: 135 U/L — ABNORMAL HIGH (ref 38–126)
Anion gap: 4 — ABNORMAL LOW (ref 5–15)
BUN: 25 mg/dL — ABNORMAL HIGH (ref 6–20)
CO2: 21 mmol/L — ABNORMAL LOW (ref 22–32)
Calcium: 8.1 mg/dL — ABNORMAL LOW (ref 8.9–10.3)
Chloride: 108 mmol/L (ref 98–111)
Creatinine, Ser: 1.09 mg/dL — ABNORMAL HIGH (ref 0.44–1.00)
GFR calc Af Amer: >60 mL/min (ref >60)
GFR calc non Af Amer: 56 mL/min — ABNORMAL LOW (ref >60)
Glucose, Bld: 153 mg/dL — ABNORMAL HIGH (ref 70–99)
Potassium: 3.9 mmol/L (ref 3.5–5.1)
Sodium: 133 mmol/L — ABNORMAL LOW (ref 135–145)
Total Bilirubin: 0.6 mg/dL (ref 0.3–1.2)
Total Protein: 6.5 g/dL (ref 6.5–8.1)

## 2019-07-07 LAB — PHOSPHORUS: Phosphorus: 4 mg/dL (ref 2.5–4.6)

## 2019-07-07 LAB — URINE CULTURE: Culture: >=100000 — AB

## 2019-07-07 LAB — GLUCOSE, CAPILLARY
Glucose-Capillary: 121 mg/dL — ABNORMAL HIGH (ref 70–99)
Glucose-Capillary: 160 mg/dL — ABNORMAL HIGH (ref 70–99)
Glucose-Capillary: 175 mg/dL — ABNORMAL HIGH (ref 70–99)
Glucose-Capillary: 196 mg/dL — ABNORMAL HIGH (ref 70–99)

## 2019-07-07 LAB — PREALBUMIN: Prealbumin: 9.7 mg/dL — ABNORMAL LOW (ref 18–38)

## 2019-07-07 LAB — MAGNESIUM: Magnesium: 1.6 mg/dL — ABNORMAL LOW (ref 1.7–2.4)

## 2019-07-07 LAB — TRIGLYCERIDES: Triglycerides: 83 mg/dL (ref <150)

## 2019-07-07 MED ORDER — ESCITALOPRAM OXALATE 20 MG PO TABS
20.0000 mg | ORAL_TABLET | Freq: Every day | ORAL | Status: DC
  Administered 2019-07-07 – 2019-07-08 (×2): 20 mg via ORAL
  Filled 2019-07-07 (×2): qty 1

## 2019-07-07 MED ORDER — PERPHENAZINE 2 MG PO TABS
2.0000 mg | ORAL_TABLET | Freq: Two times a day (BID) | ORAL | Status: DC
  Administered 2019-07-07 – 2019-07-08 (×3): 2 mg via ORAL
  Filled 2019-07-07 (×3): qty 1

## 2019-07-07 MED ORDER — SALINE SPRAY 0.65 % NA SOLN
1.0000 | NASAL | Status: DC | PRN
  Filled 2019-07-07: qty 44

## 2019-07-07 MED ORDER — AMLODIPINE BESYLATE 5 MG PO TABS
5.0000 mg | ORAL_TABLET | Freq: Every day | ORAL | Status: DC
  Administered 2019-07-07 – 2019-07-08 (×2): 5 mg via ORAL
  Filled 2019-07-07 (×2): qty 1

## 2019-07-07 MED ORDER — FERROUS SULFATE 300 (60 FE) MG/5ML PO SYRP
300.0000 mg | ORAL_SOLUTION | Freq: Every day | ORAL | Status: DC
  Administered 2019-07-08: 300 mg via ORAL
  Filled 2019-07-07 (×2): qty 5

## 2019-07-07 MED ORDER — MAGNESIUM 30 MG PO TABS
30.0000 mg | ORAL_TABLET | Freq: Two times a day (BID) | ORAL | 0 refills | Status: DC
Start: 2019-07-07 — End: 2019-07-07

## 2019-07-07 MED ORDER — BOOST / RESOURCE BREEZE PO LIQD CUSTOM
1.0000 | Freq: Two times a day (BID) | ORAL | Status: DC
  Administered 2019-07-07 – 2019-07-08 (×3): 1 via ORAL

## 2019-07-07 MED ORDER — SODIUM CHLORIDE 0.9 % IV BOLUS
1000.0000 mL | Freq: Once | INTRAVENOUS | Status: AC
  Administered 2019-07-07: 1000 mL via INTRAVENOUS

## 2019-07-07 MED ORDER — METOPROLOL TARTRATE 50 MG PO TABS
50.0000 mg | ORAL_TABLET | Freq: Two times a day (BID) | ORAL | Status: DC
  Administered 2019-07-07 – 2019-07-08 (×3): 50 mg via ORAL
  Filled 2019-07-07 (×3): qty 1

## 2019-07-07 MED ORDER — FERROUS SULFATE 300 (60 FE) MG/5ML PO SYRP: 300.0000 mg | ORAL_SOLUTION | Freq: Every day | ORAL | 3 refills | Status: AC

## 2019-07-07 MED ORDER — NYSTATIN 100000 UNIT/ML MT SUSP: 5.0000 mL | Freq: Four times a day (QID) | OROMUCOSAL | 0 refills | Status: DC

## 2019-07-07 MED ORDER — MAGNESIUM SULFATE 2 GM/50ML IV SOLN
2.0000 g | Freq: Once | INTRAVENOUS | Status: AC
  Administered 2019-07-07: 2 g via INTRAVENOUS
  Filled 2019-07-07: qty 50

## 2019-07-07 MED ORDER — MAGNESIUM 400 MG PO CAPS: 400.0000 mg | ORAL_CAPSULE | Freq: Two times a day (BID) | ORAL | 0 refills | Status: AC

## 2019-07-07 MED ORDER — TRAVASOL 10 % IV SOLN
INTRAVENOUS | Status: DC
  Filled 2019-07-07: qty 1035.84

## 2019-07-07 MED ORDER — LOPERAMIDE HCL 2 MG PO CAPS: 2.0000 mg | ORAL_CAPSULE | Freq: Three times a day (TID) | ORAL | 1 refills | Status: AC | PRN

## 2019-07-07 NOTE — Progress Notes (Signed)
Supervising Physician: Corrie Mckusick  Patient Status:  [facility] - In-pt  Chief Complaint: EC fistula Malnutrition  Subjective: "I want to go home." No concerns or complaints related to drain.  Breakfast tray at bedside untouched.    Allergies: Bee venom and Metformin and related  Medications: Prior to Admission medications   Medication Sig Start Date End Date Taking? Authorizing Provider  amLODipine (NORVASC) 5 MG tablet Take 1 tablet (5 mg total) by mouth daily. 06/10/19 07/10/19 Yes Shelly Coss, MD  ascorbic acid (VITAMIN C) 500 MG tablet Take 1 tablet (500 mg total) by mouth 2 (two) times daily. 06/10/19  Yes Shelly Coss, MD  escitalopram (LEXAPRO) 10 MG tablet Take 1 tablet (10 mg total) by mouth daily. Patient taking differently: Take 20 mg by mouth daily.  06/10/19  Yes Shelly Coss, MD  gabapentin (NEURONTIN) 300 MG capsule Take 1 capsule (300 mg total) by mouth 2 (two) times daily. 06/10/19  Yes Adhikari, Tamsen Meek, MD  melatonin 3 MG TABS tablet Take 1 tablet (3 mg total) by mouth at bedtime. 06/10/19  Yes Shelly Coss, MD  metoprolol tartrate (LOPRESSOR) 50 MG tablet Take 1 tablet (50 mg total) by mouth 2 (two) times daily. 06/10/19  Yes Shelly Coss, MD  Nutritional Supplements (ENSURE ENLIVE PO) Take 237 mLs by mouth 2 (two) times daily with a meal.   Yes [provider]  ondansetron (ZOFRAN) 4 MG tablet Take 1 tablet (4 mg total) by mouth every 6 (six) hours as needed for nausea. 06/10/19  Yes Shelly Coss, MD  oxyCODONE (OXY IR/ROXICODONE) 5 MG immediate release tablet Take 1 tablet (5 mg total) by mouth every 6 (six) hours as needed (or pain). 06/10/19  Yes Shelly Coss, MD  pantoprazole (PROTONIX) 40 MG tablet Take 1 tablet (40 mg total) by mouth daily. 06/10/19  Yes Shelly Coss, MD  perphenazine (TRILAFON) 2 MG tablet Take 2 mg by mouth 2 (two) times daily.   Yes [provider]  polyethylene glycol (MIRALAX / GLYCOLAX) 17 g packet  Take 17 g by mouth daily.   Yes [provider]  sennosides-docusate sodium (SENOKOT-S) 8.6-50 MG tablet Take 2 tablets by mouth at bedtime as needed for constipation.   Yes [provider]  simethicone (MYLICON) 80 MG chewable tablet Chew 80 mg by mouth 4 (four) times daily.   Yes [provider]  sodium chloride (OCEAN) 0.65 % SOLN nasal spray Place 1 spray into both nostrils every 4 (four) hours as needed (dry nasal membranes).   Yes [provider]  ferrous sulfate 300 (60 Fe) MG/5ML syrup Take 5 mLs (300 mg total) by mouth daily with breakfast. 07/07/19   Regalado, Belkys A, MD  loperamide (IMODIUM) 2 MG capsule Take 1 capsule (2 mg total) by mouth every 8 (eight) hours as needed for diarrhea or loose stools. 07/07/19   Regalado, Belkys A, MD  magnesium 30 MG tablet Take 1 tablet (30 mg total) by mouth 2 (two) times daily. 07/07/19   Regalado, Belkys A, MD  nystatin (MYCOSTATIN) 100000 UNIT/ML suspension Take 5 mLs (500,000 Units total) by mouth 4 (four) times daily. 07/07/19   Regalado, Belkys A, MD     Vital Signs: BP (!) 164/106 (BP Location: Left Arm) Comment: RN notified  Pulse 81   Temp 97.8 F (36.6 C) (Oral)   Resp 20   LMP 03/29/2015   SpO2 98%   Physical Exam  NAD, alert Abdomen: soft, non-tender.  Eakins pouch in place.  LLQ drain in place.  Insertion site c/d/i.  Feculent material in tubing/bag.   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 CONTRAST:  142mL OMNIPAQUE IOHEXOL 300 MG/ML SOLN, additional oral enteric contrast COMPARISON:  05/22/2019, 04/11/2019 FINDINGS: Lower chest: Redemonstrated dense atelectasis or scarring  of the right greater than left lung bases with a trace residual right pleural effusion, significantly decreased compared to prior examination. Hepatobiliary: No solid liver abnormality is seen. Gallstones and gravel in the gallbladder. Periportal edema, similar to prior examination. No significant biliary ductal dilatation. Pancreas: Unremarkable. No pancreatic ductal dilatation or surrounding inflammatory changes. Spleen: Normal in size without significant abnormality. Adrenals/Urinary Tract: Adrenal glands are unremarkable. Kidneys are normal, without renal calculi, solid lesion, or hydronephrosis. Foley catheter decompressing the urinary bladder. Thickened urinary bladder wall. Stomach/Bowel: Stomach is within normal limits. No evidence of bowel wall thickening, distention, or inflammatory changes. Large burden of stool throughout the colon. Vascular/Lymphatic: Aortic atherosclerosis. No enlarged abdominal or pelvic lymph nodes. Reproductive: No mass or other significant abnormality. Other: Redemonstrated flat, elongated air and contrast containing ventral peritoneal cavity, with a left approach percutaneous pigtail drainage catheter (series 2, image 43). There is fistula to the skin with overlying wound dressing. This cavity measures approximately 13.0 x 7.5 x 0.7 cm and is similar to prior examination. A previously seen right approach surgical drain has been removed. Evident nidus of contrast fistulation is to small bowel in the left hemiabdomen (series 2, image 49). Musculoskeletal: No acute or significant osseous findings. IMPRESSION: 1. Redemonstrated flat, elongated air and contrast containing ventral peritoneal cavity, with a left approach percutaneous pigtail drainage catheter. There is a fistula to the skin with overlying wound dressing and the evident nidus of contrast is small bowel in the left hemiabdomen. 2. Periportal edema, similar to prior examination and of uncertain significance, possibly  reactive to adjacent inflammation. 3.  Cholelithiasis. 4.  Aortic Atherosclerosis (ICD10-I70.0). Electronically Signed   By: Eddie Candle M.D.   On: 07/05/2019 16:03   DG Chest Port 1 View  Result Date: 07/04/2019 CLINICAL DATA:  Nausea and vomiting, anemia, PICC placement EXAM: PORTABLE CHEST 1 VIEW COMPARISON:  06/01/2019 FINDINGS: Single frontal view of the chest demonstrates right-sided PICC tip overlying superior vena cava. The cardiac silhouette is unremarkable. No airspace disease, effusion, or pneumothorax. No acute bony abnormalities. IMPRESSION: 1. Right PICC overlying superior vena cava. 2. No acute intrathoracic process. Electronically Signed   By: Randa Ngo M.D.   On: 07/04/2019 16:36    Labs:  CBC: Recent Labs    07/05/19 0117 07/05/19 0117 07/05/19 0320 07/05/19 0840 07/06/19 0315 07/07/19 0330  WBC 6.9  --   --  7.9 5.9 6.5  HGB 9.5*   < > 9.4* 9.6* 9.5* 9.1*  HCT 29.9*   < > 29.7* 29.8* 29.4* 28.6*  PLT 268  --   --  275 246 241   < > = values in this interval not displayed.    COAGS: Recent Labs    02/02/19 1832 02/02/19 1952  INR 1.3* 1.2  APTT  --  27    BMP: Recent Labs    07/04/19 1647 07/05/19 0320 07/06/19 0315 07/07/19 0330  NA 139 138 138 133*  K 4.0  4.3 3.8 3.9  CL 106 107 111 108  CO2 25 23 22  21*  GLUCOSE 135* 98 113* 153*  BUN 43* 40* 25* 25*  CALCIUM 8.8* 8.6* 8.5* 8.1*  CREATININE 0.91 0.92 0.89 1.09*  GFRNONAA >60 >60 >60 56*  GFRAA >60 >60 >60 >60    LIVER FUNCTION TESTS: Recent Labs    07/04/19 1647 07/05/19 0320 07/06/19 0315 07/07/19 0330  BILITOT 0.5 0.8 0.6 0.6  AST 15 14* 16 24  ALT 13 11 11 17   ALKPHOS 67 61 57 135*  PROT 7.6 7.2 6.8 6.5  ALBUMIN 2.5* 2.3* 2.1* 2.0*    Assessment and Plan: 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  S/p aspiration and drainage of peritoneal fluid collection 02/26/19. Patient admitted with malnutrition, anemia requiring blood transfusion.  She has a LLQ  drain in place related to management of her EC fistula.  She also has an Eakin's pouch for open drainage of her fistula along upper midline abdomen.  CT Abdomen Pelvis reviewed between Dr. Earleen [place] and surgery.  Drain is positioned within the fistula track.  Ok to remove.   PA to bedside.  Removed drain in its entirety without complication.  Patient tolerated well.  Dressing placed.  No IR follow-up needed at this time.   Electronically Signed: Docia Barrier, PA 07/07/2019, 1:02 PM   I spent a total of 15 Minutes at the the patient's bedside AND on the patient's hospital floor or unit, greater than 50% of which was counseling/coordinating care for enterocutaneous fistula.

## 2019-07-07 NOTE — Progress Notes (Signed)
PROGRESS NOTE    Gina [name]  OLM:[id number] DOB: 1960/04/14 DOA: 07/04/2019 PCP: [name], MD   Brief Narrative: 59 year old with past medical history significant for diabetes, hypertension, bilateral BKA, perforated gastric ulcer, osteomyelitis, schizophrenia, recurrent DKA's, history of peritonsillar abscess, history of MSSA bacteremia who presents to the ER complaining of abdominal pain and nausea.  She is having pain more in the epigastric area.  Also patient presented with a drop in hemoglobin at 6.6 on admission.  Occult blood positive.  She recently completed treatment for MRSA bacteremia March of this year.  She has a prior history of acute peritonitis with exploratory laparotomy done with Grahan patch November last year.  Subsequently had Candida glabrata blood cultures and peritoneal fluid also showing Candida.  She had IR drainage.   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  Received 2 units of packed red blood cell.  Hemoglobin increased to 9. GI consulted, no overt GI bleeding.  GI recommends CT abdomen and pelvis to evaluate abdominal pain.  No plan for endoscopy at this point. Check anemia panel.  Hb stable.   2-Hypertension: Continue to hold medication due to severe anemia and concern for GI bleed.  3-Prior history of acute peritonitis status post exploratory laparotomy with [name] patch November 0100, complicated by enterocutaneous fistula and currently has IR guided drain placement and  [place] over the central abdomen: -CT abdomen and pelvis; Redemonstrated flat, elongated air and contrast containing ventral peritoneal cavity, with a left approach percutaneous pigtail drainage catheter. There is a fistula to the skin with overlying  wound dressing and the evident nidus of contrast is small bowel in the left hemiabdomen. -Surgery consulted.  -Surgery consulted. Recommended to remove IR drain. Removed today.   4-Bilateral BKA: With wound.  Wound care consulted. 5-History of MRSA bacteremia completed antibiotic March 25. Pressure injury Coccyx Bilateral. Wound care consulted.  6-Severe Malnutrition; resume TPN. No family available at home tonight to provide TPN. Keep overnight.  Mild Increase Cr; IV fluids.   Pressure Injury 05/22/19 Coccyx Bilateral Stage 2 -  Partial thickness loss of dermis presenting as a shallow open injury with a red, pink wound bed without slough. (Active)  05/22/19 2104  Location: Coccyx  Location Orientation: Bilateral  Staging: Stage 2 -  Partial thickness loss of dermis presenting as a shallow open injury with a red, pink wound bed without slough.  Wound Description (Comments):   Present on Admission: Yes     Pressure Injury 05/22/19 Knee Right;Distal Unstageable - Full thickness tissue loss in which the base of the injury is covered by slough (yellow, tan, gray, green or brown) and/or eschar (tan, brown or black) in the wound bed. Stump (Active)  05/22/19 2106  Location: Knee  Location Orientation: Right;Distal  Staging: Unstageable - Full thickness tissue loss in which the base of the injury is covered by slough (yellow, tan, gray, green or brown) and/or eschar (tan, brown or black) in the wound bed.  Wound Description (Comments): Stump  Present on Admission: Yes     Pressure Injury 05/22/19 Knee Left;Distal Unstageable - Full thickness tissue loss in which the base of the injury is covered by slough (yellow, tan, gray, green or brown) and/or eschar (tan, brown or black) in the wound  bed. Stump (Active)  05/22/19 2108  Location: Knee  Location Orientation: Left;Distal  Staging: Unstageable - Full thickness tissue loss in which the base of the injury is covered by slough (yellow, tan,  gray, green or brown) and/or eschar (tan, brown or black) in the wound bed.  Wound Description (Comments): Stump  Present on Admission: Yes     Pressure Injury 05/22/19 Thigh Left;Mid Stage 2 -  Partial thickness loss of dermis presenting as a shallow open injury with a red, pink wound bed without slough. (Active)  05/22/19 2109  Location: Thigh  Location Orientation: Left;Mid  Staging: Stage 2 -  Partial thickness loss of dermis presenting as a shallow open injury with a red, pink wound bed without slough.  Wound Description (Comments):   Present on Admission: Yes     Estimated body mass index is 24.6 kg/m as calculated from the following:   Height as of 06/28/19: 5\' 2"  (1.575 m).   Weight as of 06/28/19: 61 kg.   DVT prophylaxis: SCD Code Status: DNR Family Communication: Try to contact daughter  Disposition Plan:  Patient is from: Home Anticipated d/c date: 2-3 days depending on Ct scan results.  Barriers to d/c or necessity for inpatient status: patient with GI bleed, monitor for recurrent bleeding, abdominal pain undergoing evaluation.   Consultants:   GI  Procedures:   None  Antimicrobials:    Subjective: Feeling well. Denies abdominal pain   Objective: Vitals:   07/06/19 2010 07/07/19 0115 07/07/19 0609 07/07/19 1526  BP: (!) 176/94 (!) 168/87 (!) 164/106 (!) 189/89  Pulse: 79 88 81 89  Resp: 20 20 20 19   Temp: 98.2 F (36.8 C) 98 F (36.7 C) 97.8 F (36.6 C) 97.6 F (36.4 C)  TempSrc: Oral Oral Oral Oral  SpO2: 100% 99% 98% 100%    Intake/Output Summary (Last 24 hours) at 07/07/2019 1659 Last data filed at 07/07/2019 0600 Gross per 24 hour  Intake 1488.43 ml  Output 1350 ml  Net 138.43 ml   There were no vitals filed for this visit.  Examination:  General exam: Chronic ill appearing Respiratory system: CTA Cardiovascular system: S 1, S 2 RRR Gastrointestinal system: Abdomen soft, drain left side, midline eakin wound drainage.  Central  nervous system: Alert Extremities; B/L BKA   Data Reviewed: I have personally reviewed following labs and imaging studies  CBC: Recent Labs  Lab 07/03/19 2050 07/03/19 2050 07/04/19 1647 07/04/19 1647 07/05/19 0117 07/05/19 0320 07/05/19 0840 07/06/19 0315 07/07/19 0330  WBC 5.9   < > 9.6  --  6.9  --  7.9 5.9 6.5  NEUTROABS 2.8  --  7.3  --   --   --   --  2.8 4.0  HGB 6.7*   < > 6.6*   < > 9.5* 9.4* 9.6* 9.5* 9.1*  HCT 21.2*   < > 21.0*   < > 29.9* 29.7* 29.8* 29.4* 28.6*  MCV 95.1   < > 96.3  --  92.9  --  92.3 93.3 93.5  PLT 327   < > 300  --  268  --  275 246 241   < > = values in this interval not displayed.   Basic Metabolic Panel: Recent Labs  Lab 07/03/19 2050 07/04/19 1647 07/05/19 0320 07/06/19 0315 07/07/19 0330  NA 134* 139 138 138 133*  K 3.9 4.0 4.3 3.8 3.9  CL 102 106 107 111 108  CO2 26 25 23 22  21*  GLUCOSE 124*  135* 98 113* 153*  BUN 34* 43* 40* 25* 25*  CREATININE 0.70 0.91 0.92 0.89 1.09*  CALCIUM 8.6* 8.8* 8.6* 8.5* 8.1*  MG 1.7  --   --  1.7 1.6*  PHOS 4.1  --   --  3.9 4.0   GFR: Estimated Creatinine Clearance: 48.4 mL/min (A) (by C-G formula based on SCr of 1.09 mg/dL (H)). Liver Function Tests: Recent Labs  Lab 07/03/19 2050 07/04/19 1647 07/05/19 0320 07/06/19 0315 07/07/19 0330  AST 18 15 14* 16 24  ALT 14 13 11 11 17   ALKPHOS 76 67 61 57 135*  BILITOT 0.4 0.5 0.8 0.6 0.6  PROT 7.0 7.6 7.2 6.8 6.5  ALBUMIN 2.2* 2.5* 2.3* 2.1* 2.0*   No results for input(s): LIPASE, AMYLASE in the last 168 hours. No results for input(s): AMMONIA in the last 168 hours. Coagulation Profile: No results for input(s): INR, PROTIME in the last 168 hours. Cardiac Enzymes: No results for input(s): CKTOTAL, CKMB, CKMBINDEX, TROPONINI in the last 168 hours. BNP (last 3 results) No results for input(s): PROBNP in the last 8760 hours. HbA1C: No results for input(s): HGBA1C in the last 72 hours. CBG: Recent Labs  Lab 07/06/19 1210 07/06/19 1627  07/06/19 2308 07/07/19 0613 07/07/19 1218  GLUCAP 122* 113* 119* 160* 175*   Lipid Profile: Recent Labs    07/06/19 0315 07/07/19 0330  TRIG 97 83   Thyroid Function Tests: No results for input(s): TSH, T4TOTAL, FREET4, T3FREE, THYROIDAB in the last 72 hours. Anemia Panel: Recent Labs    07/06/19 1250  VITAMINB12 721  FOLATE 28.0  FERRITIN 377*  TIBC 163*  IRON 27*  RETICCTPCT 1.6   Sepsis Labs: No results for input(s): PROCALCITON, LATICACIDVEN in the last 168 hours.  Recent Results (from the past 240 hour(s))  Respiratory Panel by RT PCR (Flu A&B, Covid) - Nasopharyngeal Swab     Status: None   Collection Time: 07/04/19  6:40 PM   Specimen: Nasopharyngeal Swab  Result Value Ref Range Status   SARS Coronavirus 2 by RT PCR NEGATIVE NEGATIVE Final    Comment: (NOTE) SARS-CoV-2 target nucleic acids are NOT DETECTED. The SARS-CoV-2 RNA is generally detectable in upper respiratoy specimens during the acute phase of infection. The lowest concentration of SARS-CoV-2 viral copies this assay can detect is 131 copies/mL. A negative result does not preclude SARS-Cov-2 infection and should not be used as the sole basis for treatment or other patient management decisions. A negative result may occur with  improper specimen collection/handling, submission of specimen other than nasopharyngeal swab, presence of viral mutation(s) within the areas targeted by this assay, and inadequate number of viral copies (<131 copies/mL). A negative result must be combined with clinical observations, patient history, and epidemiological information. The expected result is Negative. Fact Sheet for Patients:  PinkCheek.be Fact Sheet for Healthcare Providers:  GravelBags.it This test is not yet ap proved or cleared by the [place] FDA and  has been authorized for detection and/or diagnosis of SARS-CoV-2 by FDA under an Emergency Use  Authorization (EUA). This EUA will remain  in effect (meaning this test can be used) for the duration of the COVID-19 declaration under Section 564(b)(1) of the Act, 21 U.S.C. section 360bbb-3(b)(1), unless the authorization is terminated or revoked sooner.    Influenza A by PCR NEGATIVE NEGATIVE Final   Influenza B by PCR NEGATIVE NEGATIVE Final    Comment: (NOTE) The Xpert Xpress SARS-CoV-2/FLU/RSV assay is intended as an aid in  the diagnosis of influenza from Nasopharyngeal swab specimens and  should not be used as a sole basis for treatment. Nasal washings and  aspirates are unacceptable for Xpert Xpress SARS-CoV-2/FLU/RSV  testing. Fact Sheet for Patients: PinkCheek.be Fact Sheet for Healthcare Providers: GravelBags.it This test is not yet approved or cleared by the [place] FDA and  has been authorized for detection and/or diagnosis of SARS-CoV-2 by  FDA under an Emergency Use Authorization (EUA). This EUA will remain  in effect (meaning this test can be used) for the duration of the  Covid-19 declaration under Section 564(b)(1) of the Act, 21  U.S.C. section 360bbb-3(b)(1), unless the authorization is  terminated or revoked. Performed at [facility], [place] [address], [place], [place] [address]   Urine Culture     Status: Abnormal   Collection Time: 07/05/19  7:20 AM   Specimen: Urine, Random  Result Value Ref Range Status   Specimen Description   Final    URINE, RANDOM Performed at [place] [address], [place], [place] [address]    Special Requests   Final    NONE Performed at [facility], [place] [address], [place], [place] [address]    Culture >=100,000 COLONIES/mL PROTEUS MIRABILIS (A)  Final   Report Status 07/07/2019 FINAL  Final   Organism ID, Bacteria PROTEUS MIRABILIS (A)  Final      Susceptibility   Proteus mirabilis - MIC*     AMPICILLIN <=2 SENSITIVE Sensitive     CEFAZOLIN 8 SENSITIVE Sensitive     CEFTRIAXONE <=1 SENSITIVE Sensitive     CIPROFLOXACIN >=4 RESISTANT Resistant     GENTAMICIN <=1 SENSITIVE Sensitive     IMIPENEM 2 SENSITIVE Sensitive     NITROFURANTOIN 128 RESISTANT Resistant     TRIMETH/SULFA 40 SENSITIVE Sensitive     AMPICILLIN/SULBACTAM <=2 SENSITIVE Sensitive     PIP/TAZO <=4 SENSITIVE Sensitive     * >=100,000 COLONIES/mL PROTEUS MIRABILIS         Radiology Studies: No results found.      Scheduled Meds: . sodium chloride   Intravenous Once  . amLODipine  5 mg Oral Daily  . Chlorhexidine Gluconate Cloth  6 each Topical Daily  . escitalopram  20 mg Oral Daily  . feeding supplement  1 Container Oral BID BM  . ferrous sulfate  300 mg Oral Q breakfast  . insulin aspart  0-9 Units Subcutaneous Q6H  . metoprolol tartrate  50 mg Oral BID  . nystatin  5 mL Oral QID  . pantoprazole (PROTONIX) IV  40 mg Intravenous Q12H  . perphenazine  2 mg Oral BID  . sodium chloride flush  10-40 mL Intracatheter Q12H   Continuous Infusions: . TPN ADULT (ION) 75 mL/hr at 07/07/19 0400  . TPN ADULT (ION)       LOS: 3 days    Time spent: 35 minutes.     Elmarie Shiley, MD [facility]   If 7PM-7AM, please contact night-coverage [email or web address]  07/07/2019, 4:59 PM

## 2019-07-07 NOTE — Care Management Important Message (Signed)
Important Message  Patient Details IM Letter given to Nancy Marus RN Case Manager to present to the Patient Name: Gina [name] MRN: [id number] Date of Birth: 1961/02/13   Medicare Important Message Given:  Yes     Kerin Salen 07/07/2019, 10:51 AM

## 2019-07-07 NOTE — TOC Progression Note (Signed)
Transition of Care [facility]) - Progression Note    Patient Details  Name: Gina [name] MRN: [id number] Date of Birth: 1960/11/09  Transition of Care [facility]) CM/SW Contact  Joaquin Courts, RN Phone Number: 07/07/2019, 5:01 PM  Clinical Narrative: Patient previously active with [facility] for home TPN and [facility] for nursing services.  CM called all agency providers ([facility], [facility], [facility], [facility], [facility], [facility], [facility], [facility]) who have all declined to accept patient citing being out of network with insurance or lack of staffing.  At this time CM is unable to arrange any [facility] services.  Per Carolynn Sayers with [facility], agency will continue to provide home TPN and [facility] will continue to provide home nurse.  CM also followed up on patients PCS aide services, application for this was submitted on her previous admission to the hospital but no services have started yet.  Per [employer] rep, referral was sent to three home care agencies who have all declined and patient's family/patient will need to contact [employer] to provide additional choices of agencies that they would like to use.  Cm called and left voicemail for patient's daughter requesting a call back so this information can be provided to her.           Expected Discharge Plan: [place] Barriers to Discharge: Continued Medical Work up  Expected Discharge Plan and Services Expected Discharge Plan: [place] Choice: Resumption of Svcs/PTA Provider Living arrangements for the past 2 months: Single Family Home Expected Discharge Date: 07/07/19                         HH Arranged: TPN HH Agency: [employer] spoke with at [employer]: [place] ([place]) Interventions    Readmission Risk Interventions Readmission Risk Prevention Plan 02/10/2019 11/07/2017  Post Dischage Appt - Patient refused  Medication  Screening - Complete  Transportation Screening Complete Complete  PCP follow-up - Patient refused  PCP or Specialist Appt within 3-5 Days Not Complete -  Not Complete comments DC date unknown- pt established with PCP -  [place] or Home Care Consult Complete -  Social Work Consult for [place] Planning/Counseling Not Complete -  SW consult not completed comments awaiting call back from daughter and APS -  Palliative Care Screening Not Complete -  Palliative Care Screening Not Complete Comments pending need -  Medication Review ([address]) Referral to Pharmacy -  Some recent data might be hidden

## 2019-07-07 NOTE — Progress Notes (Signed)
OT Cancellation Note  Patient Details Name: Gina [name] MRN: [id number] DOB: December 13, 1960   Cancelled Treatment:    Reason Eval/Treat Not Completed: Other (comment)(Patient declined therapy stating "I had a rough night.")  Tanish Sinkler L Apollos Tenbrink 07/07/2019, 4:24 PM

## 2019-07-07 NOTE — Progress Notes (Signed)
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 PMH includes DM2, HTN, osteomyelitis with bilateral BKA's, schizophrenia, recent admissions for fungal peritonitis and MRSA bacteremia requiring PICC replacement. Now admitted for Hgb 6.6 with occult blood positive stool. Pharmacy consulted to continue managing home TPN.  TPN held on admission on 4/23, resumed on 4/25.  Glucose / Insulin: Hx DM, A1c 6.1 on 06/28/19. CBGs range 94-160. Goal BG <150. 3 units SSI/24 hrs.  Electrolytes: Na (133) slightly low, Mg (1.6) low; all other lytes WNL including CorrCa (9.7).  Renal: SCr 1.09 - slightly increased. BUN trending down Hepatic:  - LFTs wnl; albumin low (2) - TG wnl; Prealbumin 10.7 > 9.7 I/O:  - MIVF: D5/NS at 75 ml/hr GI Imaging:  4/23 CT abd: There is a fistula to the skin with overlying wound dressing and the evident nidus of contrast is small bowel in the left hemiabdomen. Cholelithiasis. Surgeries / Procedures:   Central access: PICC placed 05/29/19 TPN start date: chronic, on PTA. Resumed 4/25 in hospital  Per review of home TPN records provided by [facility]: 1818ml cycled over 14hrs daily to provide 1472 kcals/day Protein 90g Dextrose 180g Lipids 50g  Nutritional Goals: RD recs (per note 4/26): Kcal: 1830-2015; protein 90-105 g; Fluid >/= 2L/day  Current TPN at 83 mL/hr provides: 104 g protein, 1882 kcal, 279 g dextrose; meeting 100% of patient needs.  Current Nutrition:  Soft diet TPN  Plan:  Now:  -Magnesium 2 g IV once  At 1800:  Increase TPN to 83 mL/hr.  Will do continuous instead of cyclic while acutely ill  Electrolytes in TPN: Increase Na & Mg. No other changes  Na  20mEq/L, K 62mEq/L, Ca 71mEq/L, Mg 56mEq/L, Phos 8mmol/L  Cl:Ac ratio 1:2  Add standard MVI and trace elements to TPN   Continue Sensitive SSI with q6 hr CBG checks   Stop MIVF, discussed with MD.  Monitor TPN labs on Mon/Thurs. Recheck electrolytes with AM labs tomorrow  Lenis Noon, PharmD 07/07/19 11:26 AM

## 2019-07-07 NOTE — Progress Notes (Signed)
[facility] Progress Note     Subjective: CC:  Eager to go home. States that her family (daughter/other family members) perform her Eakin pouch changes at home. Patients states her LLQ drain is mostly empty at all times.  Objective: Vital signs in last 24 hours: Temp:  [97.8 F (36.6 C)-98.2 F (36.8 C)] 97.8 F (36.6 C) (04/26 0609) Pulse Rate:  [78-88] 81 (04/26 0609) Resp:  [16-20] 20 (04/26 0609) BP: (142-176)/(87-106) 164/106 (04/26 0609) SpO2:  [98 %-100 %] 98 % (04/26 0609) Last BM Date: 07/05/19  Intake/Output from previous day: 04/25 0701 - 04/26 0700 In: 1728.4 [P.O.:240; I.V.:1488.4] Out: 2500 [Urine:1500; Stool:1000] Intake/Output this shift: No intake/output data recorded.  PE: Gen:  Alert, NAD, pleasant Card:  Regular rate and rhythm, pedal pulses 2+ BL Pulm:  Normal effort, clear to auscultation bilaterally Abd: Soft, non-tender, EC fistula midline with eakin pouch in place with feculent output in pouch, to gravity. LLQ drain with <2cc brown liquid fluid.  GU: chronic indwelling foley.  Skin: warm and dry, no rashes  Psych: A&Ox3   Lab Results:  Recent Labs    07/06/19 0315 07/07/19 0330  WBC 5.9 6.5  HGB 9.5* 9.1*  HCT 29.4* 28.6*  PLT 246 241   BMET Recent Labs    07/06/19 0315 07/07/19 0330  NA 138 133*  K 3.8 3.9  CL 111 108  CO2 22 21*  GLUCOSE 113* 153*  BUN 25* 25*  CREATININE 0.89 1.09*  CALCIUM 8.5* 8.1*   PT/INR No results for input(s): LABPROT, INR in the last 72 hours. CMP     Component Value Date/Time   NA 133 (L) 07/07/2019 0330   NA 139 10/04/2017 1705   K 3.9 07/07/2019 0330   CL 108 07/07/2019 0330   CO2 21 (L) 07/07/2019 0330   GLUCOSE 153 (H) 07/07/2019 0330   BUN 25 (H) 07/07/2019 0330   BUN 11 10/04/2017 1705   CREATININE 1.09 (H) 07/07/2019 0330   CREATININE 0.68 04/28/2016 1640   CALCIUM 8.1 (L) 07/07/2019 0330   PROT 6.5 07/07/2019 0330   PROT 7.1 10/04/2017 1705   ALBUMIN 2.0 (L)  07/07/2019 0330   ALBUMIN 3.7 10/04/2017 1705   AST 24 07/07/2019 0330   ALT 17 07/07/2019 0330   ALKPHOS 135 (H) 07/07/2019 0330   BILITOT 0.6 07/07/2019 0330   BILITOT <0.2 10/04/2017 1705   GFRNONAA 56 (L) 07/07/2019 0330   GFRNONAA >89 04/28/2016 1640   GFRAA >60 07/07/2019 0330   GFRAA >89 04/28/2016 1640   Lipase     Component Value Date/Time   LIPASE 27 04/01/2016 1040       Studies/Results: CT ABDOMEN PELVIS W CONTRAST  Result Date: 07/05/2019 CLINICAL DATA:  Anemia, history of perforated duodenal ulcer complicated by enteric cutaneous fistula EXAM: CT ABDOMEN AND PELVIS WITH CONTRAST TECHNIQUE: Multidetector CT imaging of the abdomen and pelvis was performed using the standard protocol following bolus administration of intravenous contrast. CONTRAST:  156mL OMNIPAQUE IOHEXOL 300 MG/ML SOLN, additional oral enteric contrast COMPARISON:  05/22/2019, 04/11/2019 FINDINGS: Lower chest: Redemonstrated dense atelectasis or scarring of the right greater than left lung bases with a trace residual right pleural effusion, significantly decreased compared to prior examination. Hepatobiliary: No solid liver abnormality is seen. Gallstones and gravel in the gallbladder. Periportal edema, similar to prior examination. No significant biliary ductal dilatation. Pancreas: Unremarkable. No pancreatic ductal dilatation or surrounding inflammatory changes. Spleen: Normal in size without significant abnormality. Adrenals/Urinary Tract: Adrenal glands are  unremarkable. Kidneys are normal, without renal calculi, solid lesion, or hydronephrosis. Foley catheter decompressing the urinary bladder. Thickened urinary bladder wall. Stomach/Bowel: Stomach is within normal limits. No evidence of bowel wall thickening, distention, or inflammatory changes. Large burden of stool throughout the colon. Vascular/Lymphatic: Aortic atherosclerosis. No enlarged abdominal or pelvic lymph nodes. Reproductive: No mass or other  significant abnormality. Other: Redemonstrated flat, elongated air and contrast containing ventral peritoneal cavity, with a left approach percutaneous pigtail drainage catheter (series 2, image 43). There is fistula to the skin with overlying wound dressing. This cavity measures approximately 13.0 x 7.5 x 0.7 cm and is similar to prior examination. A previously seen right approach surgical drain has been removed. Evident nidus of contrast fistulation is to small bowel in the left hemiabdomen (series 2, image 49). Musculoskeletal: No acute or significant osseous findings. IMPRESSION: 1. Redemonstrated flat, elongated air and contrast containing ventral peritoneal cavity, with a left approach percutaneous pigtail drainage catheter. There is a fistula to the skin with overlying wound dressing and the evident nidus of contrast is small bowel in the left hemiabdomen. 2. Periportal edema, similar to prior examination and of uncertain significance, possibly reactive to adjacent inflammation. 3.  Cholelithiasis. 4.  Aortic Atherosclerosis (ICD10-I70.0). Electronically Signed   By: Eddie Candle M.D.   On: 07/05/2019 16:03    Anti-infectives: Anti-infectives (From admission, onward)   None     Assessment/Plan Anemia Malnutrition - pre albumin 10.7 4/25, home TNA PMH perforated pyloric ulcer s/p graham patch 02/03/19 Dr. Lucia Gaskins  EC fistula - stable, managed well with Eakin pouch. No acute surgical needs Discussed patients LLQ drain with IR Dr. Aleen Campi, drain sits within fistula track and draining minimal fluid. PA will come pull drain today.   FEN: SOFT, IVF, start TPN per pharmacy  ID: nystatin PO QID  VTE: chemical VTE was held in the setting of anemia     LOS: 3 days    Obie Dredge, [facility]

## 2019-07-07 NOTE — Progress Notes (Signed)
Initial Nutrition Assessment  DOCUMENTATION CODES:   (unable to assess for malnutrition at this time.)  INTERVENTION:  - TPN per Pharmacist. - will order Boost Breeze BID, each supplement provides 250 kcal and 9 grams of protein. - continue to encourage PO intakes.  * weigh patient today.   NUTRITION DIAGNOSIS:   Increased nutrient needs related to acute illness, chronic illness as evidenced by estimated needs.  GOAL:   Patient will meet greater than or equal to 90% of their needs  MONITOR:   PO intake, Supplement acceptance, Labs, Weight trends, Other (Comment)(TPN regimen)  REASON FOR ASSESSMENT:   Consult New TPN/TNA  ASSESSMENT:   59 y.o. female with medical history of DM, HTN, bilateral BKA, perforated gastric ulcer, osteomyelitis, schizophrenia, recurrent DKA's, hx of peritonsillar abscess, and hx of MRSA bacteremia. She presented to the ED with abdominal pain and nausea. She was found to still be guaiac positive. She was admitted in March d/t MRSA bacteremia. In November 2020 she had ex lap and Graham patch d/t acute peritonitis.  Patient sleeping at the time of visit with no family/visitors present. Patient was last seen in person by a RD on 3/25. Patient was noted to meet criteria for moderate/non-severe malnutrition in the context of chronic illness as evidenced by NFPE findings during March admission; suspect this is ongoing.   Patient is currently receiving TPN at 75 ml/hr which Pharmacist's note indicates is equivalent to home TPN regimen. She typically receives cyclic TPN at home but receiving it over 24 hours here d/t acute illness.   Per chart review, weight on 3/25 was 134 lb and it does not appear that new weight was actually obtained since that time.   Surgery team is following patient and saw her earlier this AM.   Labs reviewed; CBG: 160 mg/dl, Na: 133 mmol/l, BUN: 25 mg/dl, creatinine: 1.09 mg/dl, Ca: 8.1 mg/dl, Mg: 1.6 mg/dl, Alk Phos  elevated. Medications reviewed; 300 mg ferrous sulfate/day, sliding scale novolog, 2 g IV Mg sulfate x1 run 4/26, 5 ml mycostatin QID, 40 mg IV protonix BID. IVF; D5-NS @ 75 ml/hr (306 kcal).     NUTRITION - FOCUSED PHYSICAL EXAM:  unable to complete as patient was sleeping.   Diet Order:   Diet Order            DIET SOFT Room service appropriate? Yes; Fluid consistency: Thin  Diet effective now              EDUCATION NEEDS:   Not appropriate for education at this time  Skin:  Skin Assessment: Reviewed RN Assessment  Last BM:  4/26  Height:   Ht Readings from Last 1 Encounters:  06/28/19 5' 2"  (1.575 m)    Weight:   Wt Readings from Last 1 Encounters:  06/28/19 61 kg    Ideal Body Weight:  50 kg  BMI:  There is no height or weight on file to calculate BMI.  Estimated Nutritional Needs:   Kcal:  1830-2015 kcal  Protein:  90-105 grams  Fluid:  >= 2 L/day     Jarome Matin, MS, RD, LDN, CNSC Inpatient Clinical Dietitian RD pager # available in [facility]  After hours/weekend pager # available in [employer] [facility]

## 2019-07-08 LAB — TYPE AND SCREEN
ABO/RH(D): O POS
Antibody Screen: NEGATIVE
Unit division: 0
Unit division: 0
Unit division: 0
Unit division: 0

## 2019-07-08 LAB — BPAM RBC
Blood Product Expiration Date: 202105242359
Blood Product Expiration Date: 202105242359
Blood Product Expiration Date: 202105242359
Blood Product Expiration Date: 202105242359
ISSUE DATE / TIME: 202104231945
ISSUE DATE / TIME: 202104232325
Unit Type and Rh: 5100
Unit Type and Rh: 5100
Unit Type and Rh: 5100
Unit Type and Rh: 5100

## 2019-07-08 LAB — BASIC METABOLIC PANEL
Anion gap: 5 (ref 5–15)
BUN: 37 mg/dL — ABNORMAL HIGH (ref 6–20)
CO2: 21 mmol/L — ABNORMAL LOW (ref 22–32)
Calcium: 8 mg/dL — ABNORMAL LOW (ref 8.9–10.3)
Chloride: 110 mmol/L (ref 98–111)
Creatinine, Ser: 0.78 mg/dL (ref 0.44–1.00)
GFR calc Af Amer: >60 mL/min (ref >60)
GFR calc non Af Amer: >60 mL/min (ref >60)
Glucose, Bld: 210 mg/dL — ABNORMAL HIGH (ref 70–99)
Potassium: 3.5 mmol/L (ref 3.5–5.1)
Sodium: 136 mmol/L (ref 135–145)

## 2019-07-08 LAB — PHOSPHORUS: Phosphorus: 3.9 mg/dL (ref 2.5–4.6)

## 2019-07-08 LAB — GLUCOSE, CAPILLARY
Glucose-Capillary: 179 mg/dL — ABNORMAL HIGH (ref 70–99)
Glucose-Capillary: 169 mg/dL — ABNORMAL HIGH (ref 70–99)

## 2019-07-08 LAB — MAGNESIUM: Magnesium: 2 mg/dL (ref 1.7–2.4)

## 2019-07-08 MED ORDER — INSULIN ASPART 100 UNIT/ML ~~LOC~~ SOLN
0.0000 [IU] | Freq: Four times a day (QID) | SUBCUTANEOUS | Status: DC
  Administered 2019-07-08: 3 [IU] via SUBCUTANEOUS

## 2019-07-08 MED ORDER — TRAVASOL 10 % IV SOLN
INTRAVENOUS | Status: DC
  Filled 2019-07-08: qty 1035.84

## 2019-07-08 MED ORDER — POTASSIUM CHLORIDE 10 MEQ/50ML IV SOLN
10.0000 meq | INTRAVENOUS | Status: AC
  Administered 2019-07-08 (×2): 10 meq via INTRAVENOUS
  Filled 2019-07-08 (×2): qty 50

## 2019-07-08 NOTE — Final Consult Note (Signed)
Consultant Final Sign-Off Note    Assessment/Final recommendations  Karley Pho is a 59 y.o. female followed by me for EC fistula. LLQ IR drain removed yesterday. Drain site dressing is c/d/i. 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 No visible blood in pouch this AM. Vitals are stable. Recommend continuing with current plan of care - TPN, SOFT diet as tolerated, and Eakin pouch. Mathews RN consulted for assistance with pouching this AM after pouch leaked overnight.    Wound care (if applicable): change Eakin pouch as needed, per [facility] RN.    Diet at discharge: SOFT, ensure   Activity at discharge: ad lib   Follow-up appointment:  07/31/19 at 1:45 with Dr. Lucia Gaskins   Pending results:  Unresulted Labs (From admission, onward)    Start     Ordered   07/07/19 0500  Prealbumin  (TPN Lab Panel)  Every Monday (0500),   R    Question:  Specimen collection method  Answer:  IV Team=IV Team collect   07/05/19 1518   07/07/19 0500  Comprehensive metabolic panel  (TPN Lab Panel)  Every Mon,Thu (0500),   R    Question:  Specimen collection method  Answer:  IV Team=IV Team collect   07/05/19 1524   07/07/19 0500  Magnesium  (TPN Lab Panel)  Every Mon,Thu (0500),   R    Question:  Specimen collection method  Answer:  IV Team=IV Team collect   07/05/19 1524   07/07/19 0500  Phosphorus  (TPN Lab Panel)  Every Mon,Thu (0500),   R    Question:  Specimen collection method  Answer:  IV Team=IV Team collect   07/05/19 1524   07/07/19 0500  CBC  (TPN Lab Panel)  Every Monday (0500),   R    Question:  Specimen collection method  Answer:  IV Team=IV Team collect   07/05/19 1524   07/07/19 0500  Differential  (TPN Lab Panel)  Every Monday (0500),   R    Question:  Specimen collection method  Answer:  IV Team=IV Team collect   07/05/19 1524   07/07/19 0500  Triglycerides  (TPN Lab Panel)  Every Monday (0500),   R    Question:  Specimen collection method  Answer:  IV Team=IV Team collect    07/05/19 1524   07/07/19 0500  Prealbumin  (TPN Lab Panel)  Every Monday (0500),   R    Question:  Specimen collection method  Answer:  IV Team=IV Team collect   07/05/19 1524   07/05/19 0432  MRSA PCR Screening  Once,   R     07/05/19 0431           Medication recommendations:   Other recommendations:    Thank you for allowing [place] to participate in the care of your patient!  Please consult [place] again if you have further needs for your patient.  Darci Current Nyrah Demos 07/08/2019 8:03 AM   Subjective  No complaints. States her appetite is gradually increasing but she is not eating much. Eakin pouch leaked overnight - RN staff currently changing pouch.  Objective  Vital signs in last 24 hours: Temp:  [97.6 F (36.4 C)-98.2 F (36.8 C)] 98.1 F (36.7 C) (04/27 0605) Pulse Rate:  [78-89] 78 (04/27 0605) Resp:  [19] 19 (04/27 0605) BP: (154-189)/(74-89) 154/74 (04/27 0626) SpO2:  [100 %] 100 % (04/27 0605)  General: alert, chronically ill appearing, NAD CV: RRR Pulm: normal effort  Abd: soft, non-tender, interval  removal of LLQ drain - dressing c/d/i. Eakin pouch currently being changed by RN staff - non-bloody, feculent drainage in pouch.   Pertinent labs and Studies: Recent Labs    07/05/19 0840 07/06/19 0315 07/07/19 0330  WBC 7.9 5.9 6.5  HGB 9.6* 9.5* 9.1*  HCT 29.8* 29.4* 28.6*   BMET Recent Labs    07/07/19 0330 07/08/19 0421  NA 133* 136  K 3.9 3.5  CL 108 110  CO2 21* 21*  GLUCOSE 153* 210*  BUN 25* 37*  CREATININE 1.09* 0.78  CALCIUM 8.1* 8.0*   No results for input(s): LABURIN in the last 72 hours. Results for orders placed or performed during the hospital encounter of 07/04/19  Respiratory Panel by RT PCR (Flu A&B, Covid) - Nasopharyngeal Swab     Status: None   Collection Time: 07/04/19  6:40 PM   Specimen: Nasopharyngeal Swab  Result Value Ref Range Status   SARS Coronavirus 2 by RT PCR NEGATIVE NEGATIVE Final    Comment:  (NOTE) SARS-CoV-2 target nucleic acids are NOT DETECTED. The SARS-CoV-2 RNA is generally detectable in upper respiratoy specimens during the acute phase of infection. The lowest concentration of SARS-CoV-2 viral copies this assay can detect is 131 copies/mL. A negative result does not preclude SARS-Cov-2 infection and should not be used as the sole basis for treatment or other patient management decisions. A negative result may occur with  improper specimen collection/handling, submission of specimen other than nasopharyngeal swab, presence of viral mutation(s) within the areas targeted by this assay, and inadequate number of viral copies (<131 copies/mL). A negative result must be combined with clinical observations, patient history, and epidemiological information. The expected result is Negative. Fact Sheet for Patients:  PinkCheek.be Fact Sheet for Healthcare Providers:  GravelBags.it This test is not yet ap proved or cleared by the [place] FDA and  has been authorized for detection and/or diagnosis of SARS-CoV-2 by FDA under an Emergency Use Authorization (EUA). This EUA will remain  in effect (meaning this test can be used) for the duration of the COVID-19 declaration under Section 564(b)(1) of the Act, 21 U.S.C. section 360bbb-3(b)(1), unless the authorization is terminated or revoked sooner.    Influenza A by PCR NEGATIVE NEGATIVE Final   Influenza B by PCR NEGATIVE NEGATIVE Final    Comment: (NOTE) The Xpert Xpress SARS-CoV-2/FLU/RSV assay is intended as an aid in  the diagnosis of influenza from Nasopharyngeal swab specimens and  should not be used as a sole basis for treatment. Nasal washings and  aspirates are unacceptable for Xpert Xpress SARS-CoV-2/FLU/RSV  testing. Fact Sheet for Patients: PinkCheek.be Fact Sheet for Healthcare  Providers: GravelBags.it This test is not yet approved or cleared by the [place] FDA and  has been authorized for detection and/or diagnosis of SARS-CoV-2 by  FDA under an Emergency Use Authorization (EUA). This EUA will remain  in effect (meaning this test can be used) for the duration of the  Covid-19 declaration under Section 564(b)(1) of the Act, 21  U.S.C. section 360bbb-3(b)(1), unless the authorization is  terminated or revoked. Performed at [facility], [place] [address] [place], [place] [address]   Urine Culture     Status: Abnormal   Collection Time: 07/05/19  7:20 AM   Specimen: Urine, Random  Result Value Ref Range Status   Specimen Description   Final    URINE, RANDOM Performed at [facility] [address], [place], [place] [address]    Special  Requests   Final    NONE Performed at [facility], [place] [address] [place], [place] [address]    Culture >=100,000 COLONIES/mL PROTEUS MIRABILIS (A)  Final   Report Status 07/07/2019 FINAL  Final   Organism ID, Bacteria PROTEUS MIRABILIS (A)  Final      Susceptibility   Proteus mirabilis - MIC*    AMPICILLIN <=2 SENSITIVE Sensitive     CEFAZOLIN 8 SENSITIVE Sensitive     CEFTRIAXONE <=1 SENSITIVE Sensitive     CIPROFLOXACIN >=4 RESISTANT Resistant     GENTAMICIN <=1 SENSITIVE Sensitive     IMIPENEM 2 SENSITIVE Sensitive     NITROFURANTOIN 128 RESISTANT Resistant     TRIMETH/SULFA 40 SENSITIVE Sensitive     AMPICILLIN/SULBACTAM <=2 SENSITIVE Sensitive     PIP/TAZO <=4 SENSITIVE Sensitive     * >=100,000 COLONIES/mL PROTEUS MIRABILIS    Imaging: No results found.

## 2019-07-08 NOTE — Discharge Instructions (Signed)
Anemia  Anemia is a condition in which you do not have enough red blood cells or hemoglobin. Hemoglobin is a substance in red blood cells that carries oxygen. When you do not have enough red blood cells or hemoglobin (are anemic), your body cannot get enough oxygen and your organs may not work properly. As a result, you may feel very tired or have other problems. What are the causes? Common causes of anemia include:  Excessive bleeding. Anemia can be caused by excessive bleeding inside or outside the body, including bleeding from the intestine or from periods in women.  Poor nutrition.  Long-lasting (chronic) kidney, thyroid, and liver disease.  Bone marrow disorders.  Cancer and treatments for cancer.  HIV (human immunodeficiency virus) and AIDS (acquired immunodeficiency syndrome).  Treatments for HIV and AIDS.  Spleen problems.  Blood disorders.  Infections, medicines, and autoimmune disorders that destroy red blood cells. What are the signs or symptoms? Symptoms of this condition include:  Minor weakness.  Dizziness.  Headache.  Feeling heartbeats that are irregular or faster than normal (palpitations).  Shortness of breath, especially with exercise.  Paleness.  Cold sensitivity.  Indigestion.  Nausea.  Difficulty sleeping.  Difficulty concentrating. Symptoms may occur suddenly or develop slowly. If your anemia is mild, you may not have symptoms. How is this diagnosed? This condition is diagnosed based on:  Blood tests.  Your medical history.  A physical exam.  Bone marrow biopsy. Your health care provider may also check your stool (feces) for blood and may do additional testing to look for the cause of your bleeding. You may also have other tests, including:  Imaging tests, such as a CT scan or MRI.  Endoscopy.  Colonoscopy. How is this treated? Treatment for this condition depends on the cause. If you continue to lose a lot of blood, you may  need to be treated at a hospital. Treatment may include:  Taking supplements of iron, vitamin S31, or folic acid.  Taking a hormone medicine (erythropoietin) that can help to stimulate red blood cell growth.  Having a blood transfusion. This may be needed if you lose a lot of blood.  Making changes to your diet.  Having surgery to remove your spleen. Follow these instructions at home:  Take over-the-counter and prescription medicines only as told by your health care provider.  Take supplements only as told by your health care provider.  Follow any diet instructions that you were given.  Keep all follow-up visits as told by your health care provider. This is important. Contact a health care provider if:  You develop new bleeding anywhere in the body. Get help right away if:  You are very weak.  You are short of breath.  You have pain in your abdomen or chest.  You are dizzy or feel faint.  You have trouble concentrating.  You have bloody or black, tarry stools.  You vomit repeatedly or you vomit up blood. Summary  Anemia is a condition in which you do not have enough red blood cells or enough of a substance in your red blood cells that carries oxygen (hemoglobin).  Symptoms may occur suddenly or develop slowly.  If your anemia is mild, you may not have symptoms.  This condition is diagnosed with blood tests as well as a medical history and physical exam. Other tests may be needed.  Treatment for this condition depends on the cause of the anemia. This information is not intended to replace advice given to you by  your health care provider. Make sure you discuss any questions you have with your health care provider. Document Revised: 02/09/2017 Document Reviewed: 03/31/2016 Elsevier Patient Education  [place].

## 2019-07-08 NOTE — Progress Notes (Signed)
PHARMACY - TOTAL PARENTERAL NUTRITION CONSULT NOTE   Indication: Fistula, on chronic TPN at home  Patient Measurements: Height: 62 inches Weight: 61 kg Usual Weight:   Assessment: 59 yo female hx of perforated peptic ulcer s/p [name] patch Nov 5852 complicated by [facility] fistula s/p IR drainage now on chronic TPN since early 2021. PMH includes DM2, HTN, osteomyelitis with bilateral BKA's, schizophrenia, recent admissions for fungal peritonitis and MRSA bacteremia requiring PICC replacement. Now admitted for Hgb 6.6 with occult blood positive stool. Pharmacy consulted to continue managing home TPN.  TPN held on admission on 4/23, resumed on 4/25.  Glucose / Insulin: Hx DM, A1c 6.1 on 06/28/19. CBGs range 121-210. Goal BG <150. 6 units SSI/24 hrs.  Electrolytes: All lytes WNL, including CorrCa (9.6). K is on lower end of normal. Renal: SCr 0.8 - WNL. BUN (37) elevated, increased Hepatic:  - LFTs WNL (4/26); TG 83 - Prealbumin 10.7 > 9.7, Albumin 2 I/O: UOP 2150 mL; Unmeasured stool. I/O Net: -833 mL. MIVF stopped on 4/26. GI Imaging:  4/23 CT abd: There is a fistula to the skin with overlying wound dressing and the evident nidus of contrast is small bowel in the left hemiabdomen. Cholelithiasis. Surgeries / Procedures:   Central access: PICC placed 05/29/19 TPN start date: chronic, on TPN PTA. Resumed 4/25 in hospital  Per review of home TPN records provided by [facility]: 1883ml cycled over 14hrs daily to provide 1472 kcals/day Protein 90g Dextrose 180g Lipids 50g  Nutritional Goals: RD recs (per note 4/26): Kcal: 1830-2015; protein 90-105 g; Fluid >/= 2L/day  Current TPN at 83 mL/hr provides: 104 g protein, 1882 kcal, 279 g dextrose; meeting 100% of patient needs.  Current Nutrition:  Soft diet, no meal intake charted in previous 24 hrs TPN  Plan:  Now:  -KCl 10 mEq IV x2  At 1800:  Continue TPN at 83 mL/hr. Continuous TPN while acutely ill.  Electrolytes in TPN:  Increase K. No other changes  Na 71mEq/L, K 73mEq/L, Ca 75mEq/L, Mg 3mEq/L, Phos 10mmol/L  Cl:Ac ratio 1:2  Add standard MVI and trace elements to TPN   Increase SSI to moderate with q6 hr CBG checks   No MIVF  Monitor TPN labs on Mon/Thurs. Recheck electrolytes with AM labs tomorrow  Lenis Noon, PharmD 07/08/19 7:44 AM

## 2019-07-08 NOTE — Discharge Summary (Signed)
Physician Discharge Summary  Gina [name] EVO:[id number] DOB: [birth year] DOA: 07/04/2019  PCP: [name], MD  Admit date: 07/04/2019 Discharge date: 07/08/2019  Admitted From: Home Disposition:  Home   Recommendations for Outpatient Follow-up:  1. Follow up with PCP in 1-2 weeks 2. Please obtain BMP/CBC in one week 3. Follow up with Surgery for further care of enterocutaneous Fistula.  Home Health: Yes, PT   Discharge Condition: Stable.  CODE STATUS: Full Code Diet recommendation: Heart Healthy   Brief/Interim Summary: 59 year old with past medical history significant for diabetes, hypertension, bilateral BKA, perforated gastric ulcer, osteomyelitis, schizophrenia, recurrent DKA's, history of peritonsillar abscess, history of MSSA bacteremia who presents to the ER complaining of abdominal pain and nausea.  She is having pain more in the epigastric area.  Also patient presented with a drop in hemoglobin at 6.6 on admission.  Occult blood positive.  She recently completed treatment for MRSA bacteremia March of this year.  She has a prior history of acute peritonitis with exploratory laparotomy done with Grahan patch November last year.  Subsequently had Candida glabrata blood cultures and peritoneal fluid also showing Candida.  She had IR drainage.  1-Severe anemia: Symptomatic: Patient presented with a hemoglobin of 6.6.  Received 2 units of packed red blood cell.  Hemoglobin increased to 9. GI consulted, no overt GI bleeding.  GI recommends CT abdomen and pelvis to evaluate abdominal pain.  No plan for endoscopy at this point.  anemia panel consistent with iron deficiency. Discharge on oral iron.  Hb stable.   2-Hypertension: resume at discharge  3-Prior history of acute peritonitis status post exploratory laparotomy with [name] patch November 2993, complicated by enterocutaneous fistula and currently has IR guided drain placement and  Eakins over the central abdomen: -CT  abdomen and pelvis; Redemonstrated flat, elongated air and contrast containing ventral peritoneal cavity, with a left approach percutaneous pigtail drainage catheter. There is a fistula to the skin with overlying wound dressing and the evident nidus of contrast is small bowel in the left hemiabdomen. -Surgery consulted.  -Surgery consulted. Recommended to remove IR drain. Drain was removed.  Stable to discharge home. Needs to follow up with Sx  4-Bilateral BKA: With wound.  Wound care consulted. 5-History of MRSA bacteremia completed antibiotic March 25. Pressure injury Coccyx Bilateral stage 2. Wound care consulted.  6-Severe Malnutrition; resume TPN.  Mild Increase Cr; IV fluids.    Discharge Diagnoses:  Principal Problem:   Symptomatic anemia Active Problems:   HTN (hypertension)   Diabetes ([place])   Osteomyelitis due to type 2 diabetes mellitus ([place])   Below-knee amputee [facility])    Discharge Instructions  Discharge Instructions    Diet - low sodium heart healthy   Complete by: As directed    Increase activity slowly   Complete by: As directed    Increase activity slowly   Complete by: As directed      Allergies as of 07/08/2019      Reactions   Bee Venom Swelling   Swells up with bee stings    Metformin And Related Other (See Comments)   Upset stomach      Medication List    STOP taking these medications   polyethylene glycol 17 g packet Commonly known as: MIRALAX / GLYCOLAX     TAKE these medications   amLODipine 5 MG tablet Commonly known as: NORVASC Take 1 tablet (5 mg total) by mouth daily.   ascorbic acid 500 MG tablet Commonly known as: VITAMIN C  Take 1 tablet (500 mg total) by mouth 2 (two) times daily.   ENSURE ENLIVE PO Take 237 mLs by mouth 2 (two) times daily with a meal.   escitalopram 10 MG tablet Commonly known as: LEXAPRO Take 1 tablet (10 mg total) by mouth daily. What changed: how much to take   ferrous sulfate 300 (60 Fe) MG/5ML  syrup Take 5 mLs (300 mg total) by mouth daily with breakfast.   gabapentin 300 MG capsule Commonly known as: NEURONTIN Take 1 capsule (300 mg total) by mouth 2 (two) times daily.   loperamide 2 MG capsule Commonly known as: IMODIUM Take 1 capsule (2 mg total) by mouth every 8 (eight) hours as needed for diarrhea or loose stools. What changed:   when to take this  reasons to take this   Magnesium 400 MG Caps Take 400 mg by mouth 2 (two) times daily.   melatonin 3 MG Tabs tablet Take 1 tablet (3 mg total) by mouth at bedtime.   metoprolol tartrate 50 MG tablet Commonly known as: LOPRESSOR Take 1 tablet (50 mg total) by mouth 2 (two) times daily.   nystatin 100000 UNIT/ML suspension Commonly known as: MYCOSTATIN Take 5 mLs (500,000 Units total) by mouth 4 (four) times daily.   ondansetron 4 MG tablet Commonly known as: ZOFRAN Take 1 tablet (4 mg total) by mouth every 6 (six) hours as needed for nausea.   oxyCODONE 5 MG immediate release tablet Commonly known as: Oxy IR/ROXICODONE Take 1 tablet (5 mg total) by mouth every 6 (six) hours as needed (or pain).   pantoprazole 40 MG tablet Commonly known as: PROTONIX Take 1 tablet (40 mg total) by mouth daily.   perphenazine 2 MG tablet Commonly known as: TRILAFON Take 2 mg by mouth 2 (two) times daily.   sennosides-docusate sodium 8.6-50 MG tablet Commonly known as: SENOKOT-S Take 2 tablets by mouth at bedtime as needed for constipation.   simethicone 80 MG chewable tablet Commonly known as: MYLICON Chew 80 mg by mouth 4 (four) times daily.   sodium chloride 0.65 % Soln nasal spray Commonly known as: OCEAN Place 1 spray into both nostrils every 4 (four) hours as needed (dry nasal membranes).      Follow-up Information    [name], MD Follow up in 2 week(s).   Specialty: Internal Medicine Why: follow hb level./  Contact information: [place] [address] [phone]        Alphonsa Overall, MD. Go on 07/31/2019.   Specialty: General Surgery Why: at 1:45 PM. please arrive 15 minutes early. Contact information: [address] [place] [address] [phone]          Allergies  Allergen Reactions  . Bee Venom Swelling    Swells up with bee stings   . Metformin And Related Other (See Comments)    Upset stomach    Consultations:  Surgery  GI   Procedures/Studies: CT ABDOMEN PELVIS W CONTRAST  Result Date: 07/05/2019 CLINICAL DATA:  Anemia, history of perforated duodenal ulcer complicated by enteric cutaneous fistula EXAM: CT ABDOMEN AND PELVIS WITH CONTRAST TECHNIQUE: Multidetector CT imaging of the abdomen and pelvis was performed using the standard protocol following bolus administration of intravenous contrast. CONTRAST:  163mL OMNIPAQUE IOHEXOL 300 MG/ML SOLN, additional oral enteric contrast COMPARISON:  05/22/2019, 04/11/2019 FINDINGS: Lower chest: Redemonstrated dense atelectasis or scarring of the right greater than left lung bases with a trace residual right pleural effusion, significantly decreased  compared to prior examination. Hepatobiliary: No solid liver abnormality is seen. Gallstones and gravel in the gallbladder. Periportal edema, similar to prior examination. No significant biliary ductal dilatation. Pancreas: Unremarkable. No pancreatic ductal dilatation or surrounding inflammatory changes. Spleen: Normal in size without significant abnormality. Adrenals/Urinary Tract: Adrenal glands are unremarkable. Kidneys are normal, without renal calculi, solid lesion, or hydronephrosis. Foley catheter decompressing the urinary bladder. Thickened urinary bladder wall. Stomach/Bowel: Stomach is within normal limits. No evidence of bowel wall thickening, distention, or inflammatory changes. Large burden of stool throughout the colon. Vascular/Lymphatic: Aortic atherosclerosis. No enlarged abdominal or pelvic lymph nodes. Reproductive: No mass or other  significant abnormality. Other: Redemonstrated flat, elongated air and contrast containing ventral peritoneal cavity, with a left approach percutaneous pigtail drainage catheter (series 2, image 43). There is fistula to the skin with overlying wound dressing. This cavity measures approximately 13.0 x 7.5 x 0.7 cm and is similar to prior examination. A previously seen right approach surgical drain has been removed. Evident nidus of contrast fistulation is to small bowel in the left hemiabdomen (series 2, image 49). Musculoskeletal: No acute or significant osseous findings. IMPRESSION: 1. Redemonstrated flat, elongated air and contrast containing ventral peritoneal cavity, with a left approach percutaneous pigtail drainage catheter. There is a fistula to the skin with overlying wound dressing and the evident nidus of contrast is small bowel in the left hemiabdomen. 2. Periportal edema, similar to prior examination and of uncertain significance, possibly reactive to adjacent inflammation. 3.  Cholelithiasis. 4.  Aortic Atherosclerosis (ICD10-I70.0). Electronically Signed   By: Eddie Candle M.D.   On: 07/05/2019 16:03   DG Chest Port 1 View  Result Date: 07/04/2019 CLINICAL DATA:  Nausea and vomiting, anemia, PICC placement EXAM: PORTABLE CHEST 1 VIEW COMPARISON:  06/01/2019 FINDINGS: Single frontal view of the chest demonstrates right-sided PICC tip overlying superior vena cava. The cardiac silhouette is unremarkable. No airspace disease, effusion, or pneumothorax. No acute bony abnormalities. IMPRESSION: 1. Right PICC overlying superior vena cava. 2. No acute intrathoracic process. Electronically Signed   By: Randa Ngo M.D.   On: 07/04/2019 16:36     Subjective: Feeling better.    Discharge Exam: Vitals:   07/08/19 0605 07/08/19 0626  BP: (!) 180/85 (!) 154/74  Pulse: 78   Resp: 19   Temp: 98.1 F (36.7 C)   SpO2: 100%      General: Pt is alert, awake, not in acute  distress Cardiovascular: RRR, S1/S2 +, no rubs, no gallops Respiratory: CTA bilaterally, no wheezing, no rhonchi Abdominal: Soft, eakins wound draining Extremities: B/L BKA    The results of significant diagnostics from this hospitalization (including imaging, microbiology, ancillary and laboratory) are listed below for reference.     Microbiology: Recent Results (from the past 240 hour(s))  Respiratory Panel by RT PCR (Flu A&B, Covid) - Nasopharyngeal Swab     Status: None   Collection Time: 07/04/19  6:40 PM   Specimen: Nasopharyngeal Swab  Result Value Ref Range Status   SARS Coronavirus 2 by RT PCR NEGATIVE NEGATIVE Final    Comment: (NOTE) SARS-CoV-2 target nucleic acids are NOT DETECTED. The SARS-CoV-2 RNA is generally detectable in upper respiratoy specimens during the acute phase of infection. The lowest concentration of SARS-CoV-2 viral copies this assay can detect is 131 copies/mL. A negative result does not preclude SARS-Cov-2 infection and should not be used as the sole basis for treatment or other patient management decisions. A negative result may occur with  improper specimen  collection/handling, submission of specimen other than nasopharyngeal swab, presence of viral mutation(s) within the areas targeted by this assay, and inadequate number of viral copies (<131 copies/mL). A negative result must be combined with clinical observations, patient history, and epidemiological information. The expected result is Negative. Fact Sheet for Patients:  PinkCheek.be Fact Sheet for Healthcare Providers:  GravelBags.it This test is not yet ap proved or cleared by the [place] FDA and  has been authorized for detection and/or diagnosis of SARS-CoV-2 by FDA under an Emergency Use Authorization (EUA). This EUA will remain  in effect (meaning this test can be used) for the duration of the COVID-19 declaration under  Section 564(b)(1) of the Act, 21 U.S.C. section 360bbb-3(b)(1), unless the authorization is terminated or revoked sooner.    Influenza A by PCR NEGATIVE NEGATIVE Final   Influenza B by PCR NEGATIVE NEGATIVE Final    Comment: (NOTE) The Xpert Xpress SARS-CoV-2/FLU/RSV assay is intended as an aid in  the diagnosis of influenza from Nasopharyngeal swab specimens and  should not be used as a sole basis for treatment. Nasal washings and  aspirates are unacceptable for Xpert Xpress SARS-CoV-2/FLU/RSV  testing. Fact Sheet for Patients: PinkCheek.be Fact Sheet for Healthcare Providers: GravelBags.it This test is not yet approved or cleared by the [place] FDA and  has been authorized for detection and/or diagnosis of SARS-CoV-2 by  FDA under an Emergency Use Authorization (EUA). This EUA will remain  in effect (meaning this test can be used) for the duration of the  Covid-19 declaration under Section 564(b)(1) of the Act, 21  U.S.C. section 360bbb-3(b)(1), unless the authorization is  terminated or revoked. Performed at [facility], [place] [address], [place], [place] [address]   Urine Culture     Status: Abnormal   Collection Time: 07/05/19  7:20 AM   Specimen: Urine, Random  Result Value Ref Range Status   Specimen Description   Final    URINE, RANDOM Performed at [place] [address] [place], [place] [address]    Special Requests   Final    NONE Performed at [facility], [place] [address], [place], [place] [address]    Culture >=100,000 COLONIES/mL PROTEUS MIRABILIS (A)  Final   Report Status 07/07/2019 FINAL  Final   Organism ID, Bacteria PROTEUS MIRABILIS (A)  Final      Susceptibility   Proteus mirabilis - MIC*    AMPICILLIN <=2 SENSITIVE Sensitive     CEFAZOLIN 8 SENSITIVE Sensitive     CEFTRIAXONE <=1 SENSITIVE Sensitive     CIPROFLOXACIN >=4  RESISTANT Resistant     GENTAMICIN <=1 SENSITIVE Sensitive     IMIPENEM 2 SENSITIVE Sensitive     NITROFURANTOIN 128 RESISTANT Resistant     TRIMETH/SULFA 40 SENSITIVE Sensitive     AMPICILLIN/SULBACTAM <=2 SENSITIVE Sensitive     PIP/TAZO <=4 SENSITIVE Sensitive     * >=100,000 COLONIES/mL PROTEUS MIRABILIS     Labs: BNP (last 3 results) No results for input(s): BNP in the last 8760 hours. Basic Metabolic Panel: Recent Labs  Lab 07/03/19 2050 07/03/19 2050 07/04/19 1647 07/05/19 0320 07/06/19 0315 07/07/19 0330 07/08/19 0421  NA 134*   < > 139 138 138 133* 136  K 3.9   < > 4.0 4.3 3.8 3.9 3.5  CL 102   < > 106 107 111 108 110  CO2 26   < > 25 23 22  21* 21*  GLUCOSE 124*   < >  135* 98 113* 153* 210*  BUN 34*   < > 43* 40* 25* 25* 37*  CREATININE 0.70   < > 0.91 0.92 0.89 1.09* 0.78  CALCIUM 8.6*   < > 8.8* 8.6* 8.5* 8.1* 8.0*  MG 1.7  --   --   --  1.7 1.6* 2.0  PHOS 4.1  --   --   --  3.9 4.0 3.9   < > = values in this interval not displayed.   Liver Function Tests: Recent Labs  Lab 07/03/19 2050 07/04/19 1647 07/05/19 0320 07/06/19 0315 07/07/19 0330  AST 18 15 14* 16 24  ALT 14 13 11 11 17   ALKPHOS 76 67 61 57 135*  BILITOT 0.4 0.5 0.8 0.6 0.6  PROT 7.0 7.6 7.2 6.8 6.5  ALBUMIN 2.2* 2.5* 2.3* 2.1* 2.0*   No results for input(s): LIPASE, AMYLASE in the last 168 hours. No results for input(s): AMMONIA in the last 168 hours. CBC: Recent Labs  Lab 07/03/19 2050 07/03/19 2050 07/04/19 1647 07/04/19 1647 07/05/19 0117 07/05/19 0320 07/05/19 0840 07/06/19 0315 07/07/19 0330  WBC 5.9   < > 9.6  --  6.9  --  7.9 5.9 6.5  NEUTROABS 2.8  --  7.3  --   --   --   --  2.8 4.0  HGB 6.7*   < > 6.6*   < > 9.5* 9.4* 9.6* 9.5* 9.1*  HCT 21.2*   < > 21.0*   < > 29.9* 29.7* 29.8* 29.4* 28.6*  MCV 95.1   < > 96.3  --  92.9  --  92.3 93.3 93.5  PLT 327   < > 300  --  268  --  275 246 241   < > = values in this interval not displayed.   Cardiac Enzymes: No results  for input(s): CKTOTAL, CKMB, CKMBINDEX, TROPONINI in the last 168 hours. BNP: Invalid input(s): POCBNP CBG: Recent Labs  Lab 07/07/19 0613 07/07/19 1218 07/07/19 1802 07/07/19 2351 07/08/19 0602  GLUCAP 160* 175* 121* 196* 179*   D-Dimer No results for input(s): DDIMER in the last 72 hours. Hgb A1c No results for input(s): HGBA1C in the last 72 hours. Lipid Profile Recent Labs    07/06/19 0315 07/07/19 0330  TRIG 97 83   Thyroid function studies No results for input(s): TSH, T4TOTAL, T3FREE, THYROIDAB in the last 72 hours.  Invalid input(s): FREET3 Anemia work up Recent Labs    07/06/19 1250  VITAMINB12 721  FOLATE 28.0  FERRITIN 377*  TIBC 163*  IRON 27*  RETICCTPCT 1.6   Urinalysis    Component Value Date/Time   COLORURINE YELLOW 07/04/2019 1754   APPEARANCEUR CLOUDY (A) 07/04/2019 1754   LABSPEC 1.016 07/04/2019 1754   PHURINE 7.0 07/04/2019 1754   GLUCOSEU NEGATIVE 07/04/2019 1754   HGBUR SMALL (A) 07/04/2019 1754   BILIRUBINUR NEGATIVE 07/04/2019 1754   BILIRUBINUR N 04/28/2016 1603   KETONESUR NEGATIVE 07/04/2019 1754   PROTEINUR >=300 (A) 07/04/2019 1754   UROBILINOGEN 0.2 04/28/2016 1603   UROBILINOGEN 0.2 06/18/2013 0650   NITRITE NEGATIVE 07/04/2019 1754   LEUKOCYTESUR LARGE (A) 07/04/2019 1754   Sepsis Labs Invalid input(s): PROCALCITONIN,  WBC,  LACTICIDVEN Microbiology Recent Results (from the past 240 hour(s))  Respiratory Panel by RT PCR (Flu A&B, Covid) - Nasopharyngeal Swab     Status: None   Collection Time: 07/04/19  6:40 PM   Specimen: Nasopharyngeal Swab  Result Value Ref Range Status   SARS Coronavirus  2 by RT PCR NEGATIVE NEGATIVE Final    Comment: (NOTE) SARS-CoV-2 target nucleic acids are NOT DETECTED. The SARS-CoV-2 RNA is generally detectable in upper respiratoy specimens during the acute phase of infection. The lowest concentration of SARS-CoV-2 viral copies this assay can detect is 131 copies/mL. A negative result  does not preclude SARS-Cov-2 infection and should not be used as the sole basis for treatment or other patient management decisions. A negative result may occur with  improper specimen collection/handling, submission of specimen other than nasopharyngeal swab, presence of viral mutation(s) within the areas targeted by this assay, and inadequate number of viral copies (<131 copies/mL). A negative result must be combined with clinical observations, patient history, and epidemiological information. The expected result is Negative. Fact Sheet for Patients:  PinkCheek.be Fact Sheet for Healthcare Providers:  GravelBags.it This test is not yet ap proved or cleared by the [place] FDA and  has been authorized for detection and/or diagnosis of SARS-CoV-2 by FDA under an Emergency Use Authorization (EUA). This EUA will remain  in effect (meaning this test can be used) for the duration of the COVID-19 declaration under Section 564(b)(1) of the Act, 21 U.S.C. section 360bbb-3(b)(1), unless the authorization is terminated or revoked sooner.    Influenza A by PCR NEGATIVE NEGATIVE Final   Influenza B by PCR NEGATIVE NEGATIVE Final    Comment: (NOTE) The Xpert Xpress SARS-CoV-2/FLU/RSV assay is intended as an aid in  the diagnosis of influenza from Nasopharyngeal swab specimens and  should not be used as a sole basis for treatment. Nasal washings and  aspirates are unacceptable for Xpert Xpress SARS-CoV-2/FLU/RSV  testing. Fact Sheet for Patients: PinkCheek.be Fact Sheet for Healthcare Providers: GravelBags.it This test is not yet approved or cleared by the [place] FDA and  has been authorized for detection and/or diagnosis of SARS-CoV-2 by  FDA under an Emergency Use Authorization (EUA). This EUA will remain  in effect (meaning this test can be used) for the duration of  the  Covid-19 declaration under Section 564(b)(1) of the Act, 21  U.S.C. section 360bbb-3(b)(1), unless the authorization is  terminated or revoked. Performed at [facility], [place] [address], [place], [place] [address]   Urine Culture     Status: Abnormal   Collection Time: 07/05/19  7:20 AM   Specimen: Urine, Random  Result Value Ref Range Status   Specimen Description   Final    URINE, RANDOM Performed at [place] [address] [place], [place] [address]    Special Requests   Final    NONE Performed at [facility], [place] [address], [place], [place] [address]    Culture >=100,000 COLONIES/mL PROTEUS MIRABILIS (A)  Final   Report Status 07/07/2019 FINAL  Final   Organism ID, Bacteria PROTEUS MIRABILIS (A)  Final      Susceptibility   Proteus mirabilis - MIC*    AMPICILLIN <=2 SENSITIVE Sensitive     CEFAZOLIN 8 SENSITIVE Sensitive     CEFTRIAXONE <=1 SENSITIVE Sensitive     CIPROFLOXACIN >=4 RESISTANT Resistant     GENTAMICIN <=1 SENSITIVE Sensitive     IMIPENEM 2 SENSITIVE Sensitive     NITROFURANTOIN 128 RESISTANT Resistant     TRIMETH/SULFA 40 SENSITIVE Sensitive     AMPICILLIN/SULBACTAM <=2 SENSITIVE Sensitive     PIP/TAZO <=4 SENSITIVE Sensitive     * >=100,000 COLONIES/mL PROTEUS MIRABILIS     Time coordinating discharge: 40 minutes  SIGNED:  Elmarie Shiley, MD  [facility]

## 2019-07-08 NOTE — Consult Note (Addendum)
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 Managed with eakin pouch at home. Daughter changes every 2-3 days. Peristomal skin has irritant dermatitis from effluent.  Pouch is noted to be cut too large.  I have changed pouch and provided a pattern for next pouch change.  I have ordered additional supplies and will type care instructions for staff and family.  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  Chronic nonhealing wounds to bilateral BKA stumps.   Pressure Injury POA: Yes  Has resolving sacral wound Measurement: Sacrum:  5 cm x 5 cm x 0.3 cm  1 cm x 2 cm to each stump 0.5 cm round fistula in midline abdominal incision site, proximal to umbilicus Wound bed: necrotic to stump sites Pink and moist sacrum Denuded skin to fistula peristomal skin   Applied peristomal powder and barrier ring and cut Eakin to fit.  Left pattern at bedside.  Connected pouch to bedside drainage.  Drainage (amount, consistency, odor) moderate feculent drainage from fistula Serosanguinous form sacral and stumps are dry Periwound:intact Dressing procedure/placement/frequency: Cleanse fistula with soap and water and pat dry.  Apply stomal powder  Light dusting Use 1/2 barrier ring to encircle the fistula opening.  Cut Eakin to fit. Cut no larger than 7/8"  Pattern at bedside.  Apply Eakin.  May tape perimeter per patient preference.  Attach to bedside drainage.   Change twice weekly and PRN soilage.  Stump care:  Wrap with dry gauze, kerlix and tape daily.  Cleanse sacral wound with NS and pat dry. Apply silicone foam dressing.  Change every three days and PRN soilage.  Will not follow at this time.  Please re-consult if needed.  Domenic Moras MSN, RN, FNP-BC CWON Wound, Ostomy, Continence Nurse Pager [phone]

## 2019-07-08 NOTE — Plan of Care (Signed)
  Problem: Clinical Measurements: Goal: Will remain free from infection Outcome: Adequate for Discharge   Problem: Education: Goal: Knowledge of General Education information will improve Description: Including pain rating scale, medication(s)/side effects and non-pharmacologic comfort measures Outcome: Adequate for Discharge   Problem: Health Behavior/Discharge Planning: Goal: Ability to manage health-related needs will improve Outcome: Adequate for Discharge   Problem: Clinical Measurements: Goal: Ability to maintain clinical measurements within normal limits will improve Outcome: Adequate for Discharge Goal: Will remain free from infection Outcome: Adequate for Discharge Goal: Diagnostic test results will improve Outcome: Adequate for Discharge Goal: Respiratory complications will improve Outcome: Adequate for Discharge Goal: Cardiovascular complication will be avoided Outcome: Adequate for Discharge   Problem: Activity: Goal: Risk for activity intolerance will decrease Outcome: Adequate for Discharge   Problem: Nutrition: Goal: Adequate nutrition will be maintained Outcome: Adequate for Discharge   Problem: Coping: Goal: Level of anxiety will decrease Outcome: Adequate for Discharge   Problem: Elimination: Goal: Will not experience complications related to bowel motility Outcome: Adequate for Discharge Goal: Will not experience complications related to urinary retention Outcome: Adequate for Discharge   Problem: Pain Managment: Goal: General experience of comfort will improve Outcome: Adequate for Discharge   Problem: Safety: Goal: Ability to remain free from injury will improve Outcome: Adequate for Discharge   Problem: Skin Integrity: Goal: Risk for impaired skin integrity will decrease Outcome: Adequate for Discharge

## 2019-07-08 NOTE — TOC Transition Note (Signed)
Transition of Care [facility]) - CM/SW Discharge Note   Patient Details  Name: Gina [name] MRN: [id number] Date of Birth: Aug 31, 1960  [facility] CM/SW Contact:  Trish Mage, LCSW Phone Number: 07/08/2019, 1:26 PM   Clinical Narrative:   Patient to d/c today.  Sent home with wound care supplies.  TPN support through [employer], who also assures RN following for same.  [facility] will supply HH PT. Daughter given list of PCS agencies and instructed to call [facility] with additional choices of selected providers. Also given [facility] contact information as possible support. PTAR arranged.  TOC sign off.    Final next level of care: [place] Barriers to Discharge: Barriers Resolved   Patient Goals and CMS Choice Patient states their goals for this hospitalization and ongoing recovery are:: to go home CMS Medicare.gov Compare Post Acute Care list provided to:: Patient Represenative (must comment)    Discharge Placement                       Discharge Plan and Services     Post Acute Care Choice: Resumption of Svcs/PTA Provider                    HH Arranged: TPN HH Agency: [employer] spoke with at [place]: [place] Determinants of Health ([place]) Interventions     Readmission Risk Interventions Readmission Risk Prevention Plan 02/10/2019 11/07/2017  Post Dischage Appt - Patient refused  Medication Screening - Complete  Transportation Screening Complete Complete  PCP follow-up - Patient refused  PCP or Specialist Appt within 3-5 Days Not Complete -  Not Complete comments DC date unknown- pt established with PCP -  [place] or Home Care Consult Complete -  Social Work Consult for [place] Planning/Counseling Not Complete -  SW consult not completed comments awaiting call back from daughter and APS -  Palliative Care Screening Not Complete -  Palliative Care Screening Not Complete Comments pending need -   Medication Review ([place]) Referral to Pharmacy -  Some recent data might be hidden

## 2019-07-09 NOTE — Clinical Social Work Note (Signed)
Received call back from [place] with [facility].  At d/c, I had asked for [facility] PT, which was my understanding of need.  She states an order was included for RN wound care, which they do not have staffing to do.  As such, they are unable to provide any services.  Gina [name] states she is reaching out to some other companies to find out if they if they are able to pick up the former patient and service as ordered.

## 2019-07-21 ENCOUNTER — Inpatient Hospital Stay (HOSPITAL_COMMUNITY)
Admission: EM | Admit: 2019-07-21 | Discharge: 2019-07-25 | DRG: 377 | Disposition: A | Discharge status: Home / Self Care | Payer: Medicare Other | Attending: Family Medicine | Admitting: Family Medicine

## 2019-07-21 ENCOUNTER — Encounter (HOSPITAL_COMMUNITY): Payer: Self-pay | Admitting: Emergency Medicine

## 2019-07-21 ENCOUNTER — Other Ambulatory Visit: Payer: Self-pay

## 2019-07-21 DIAGNOSIS — Z89519 Acquired absence of unspecified leg below knee: Secondary | ICD-10-CM | POA: Diagnosis not present

## 2019-07-21 DIAGNOSIS — G8929 Other chronic pain: Secondary | ICD-10-CM | POA: Diagnosis present

## 2019-07-21 DIAGNOSIS — Z91030 Bee allergy status: Secondary | ICD-10-CM

## 2019-07-21 DIAGNOSIS — L89893 Pressure ulcer of other site, stage 3: Secondary | ICD-10-CM | POA: Diagnosis present

## 2019-07-21 DIAGNOSIS — L8990 Pressure ulcer of unspecified site, unspecified stage: Secondary | ICD-10-CM | POA: Diagnosis present

## 2019-07-21 DIAGNOSIS — D649 Anemia, unspecified: Secondary | ICD-10-CM | POA: Diagnosis present

## 2019-07-21 DIAGNOSIS — K255 Chronic or unspecified gastric ulcer with perforation: Secondary | ICD-10-CM | POA: Diagnosis not present

## 2019-07-21 DIAGNOSIS — F209 Schizophrenia, unspecified: Secondary | ICD-10-CM | POA: Diagnosis present

## 2019-07-21 DIAGNOSIS — Z888 Allergy status to other drugs, medicaments and biological substances status: Secondary | ICD-10-CM

## 2019-07-21 DIAGNOSIS — K256 Chronic or unspecified gastric ulcer with both hemorrhage and perforation: Secondary | ICD-10-CM | POA: Diagnosis not present

## 2019-07-21 DIAGNOSIS — Z8249 Family history of ischemic heart disease and other diseases of the circulatory system: Secondary | ICD-10-CM

## 2019-07-21 DIAGNOSIS — Z79899 Other long term (current) drug therapy: Secondary | ICD-10-CM

## 2019-07-21 DIAGNOSIS — Z89511 Acquired absence of right leg below knee: Secondary | ICD-10-CM

## 2019-07-21 DIAGNOSIS — F32A Depression, unspecified: Secondary | ICD-10-CM | POA: Diagnosis present

## 2019-07-21 DIAGNOSIS — N393 Stress incontinence (female) (male): Secondary | ICD-10-CM | POA: Diagnosis present

## 2019-07-21 DIAGNOSIS — E11649 Type 2 diabetes mellitus with hypoglycemia without coma: Secondary | ICD-10-CM | POA: Diagnosis not present

## 2019-07-21 DIAGNOSIS — I10 Essential (primary) hypertension: Secondary | ICD-10-CM | POA: Diagnosis not present

## 2019-07-21 DIAGNOSIS — Z833 Family history of diabetes mellitus: Secondary | ICD-10-CM

## 2019-07-21 DIAGNOSIS — Z79891 Long term (current) use of opiate analgesic: Secondary | ICD-10-CM

## 2019-07-21 DIAGNOSIS — D500 Iron deficiency anemia secondary to blood loss (chronic): Secondary | ICD-10-CM | POA: Diagnosis present

## 2019-07-21 DIAGNOSIS — F329 Major depressive disorder, single episode, unspecified: Secondary | ICD-10-CM | POA: Diagnosis present

## 2019-07-21 DIAGNOSIS — E1100 Type 2 diabetes mellitus with hyperosmolarity without nonketotic hyperglycemic-hyperosmolar coma (NKHHC): Secondary | ICD-10-CM | POA: Diagnosis present

## 2019-07-21 DIAGNOSIS — E1169 Type 2 diabetes mellitus with other specified complication: Secondary | ICD-10-CM | POA: Diagnosis present

## 2019-07-21 DIAGNOSIS — E43 Unspecified severe protein-calorie malnutrition: Secondary | ICD-10-CM | POA: Diagnosis present

## 2019-07-21 DIAGNOSIS — Z66 Do not resuscitate: Secondary | ICD-10-CM | POA: Diagnosis present

## 2019-07-21 DIAGNOSIS — R195 Other fecal abnormalities: Secondary | ICD-10-CM | POA: Diagnosis present

## 2019-07-21 DIAGNOSIS — Z8614 Personal history of Methicillin resistant Staphylococcus aureus infection: Secondary | ICD-10-CM

## 2019-07-21 DIAGNOSIS — Z8042 Family history of malignant neoplasm of prostate: Secondary | ICD-10-CM

## 2019-07-21 DIAGNOSIS — L89153 Pressure ulcer of sacral region, stage 3: Secondary | ICD-10-CM | POA: Diagnosis present

## 2019-07-21 DIAGNOSIS — E1151 Type 2 diabetes mellitus with diabetic peripheral angiopathy without gangrene: Secondary | ICD-10-CM | POA: Diagnosis present

## 2019-07-21 DIAGNOSIS — Z20822 Contact with and (suspected) exposure to covid-19: Secondary | ICD-10-CM | POA: Diagnosis present

## 2019-07-21 DIAGNOSIS — Z89512 Acquired absence of left leg below knee: Secondary | ICD-10-CM

## 2019-07-21 DIAGNOSIS — L8942 Pressure ulcer of contiguous site of back, buttock and hip, stage 2: Secondary | ICD-10-CM

## 2019-07-21 DIAGNOSIS — L89159 Pressure ulcer of sacral region, unspecified stage: Secondary | ICD-10-CM | POA: Diagnosis present

## 2019-07-21 DIAGNOSIS — Z6824 Body mass index (BMI) 24.0-24.9, adult: Secondary | ICD-10-CM

## 2019-07-21 LAB — BASIC METABOLIC PANEL
Anion gap: 8 (ref 5–15)
BUN: 41 mg/dL — ABNORMAL HIGH (ref 6–20)
CO2: 24 mmol/L (ref 22–32)
Calcium: 8.3 mg/dL — ABNORMAL LOW (ref 8.9–10.3)
Chloride: 106 mmol/L (ref 98–111)
Creatinine, Ser: 0.74 mg/dL (ref 0.44–1.00)
GFR calc Af Amer: >60 mL/min (ref >60)
GFR calc non Af Amer: >60 mL/min (ref >60)
Glucose, Bld: 73 mg/dL (ref 70–99)
Potassium: 3.8 mmol/L (ref 3.5–5.1)
Sodium: 138 mmol/L (ref 135–145)

## 2019-07-21 LAB — CBC WITH DIFFERENTIAL/PLATELET
Abs Immature Granulocytes: 0.01 10*3/uL (ref 0.00–0.07)
Basophils Absolute: 0 10*3/uL (ref 0.0–0.1)
Basophils Relative: 0 %
Eosinophils Absolute: 0.1 10*3/uL (ref 0.0–0.5)
Eosinophils Relative: 1 %
HCT: 21 % — ABNORMAL LOW (ref 36.0–46.0)
Hemoglobin: 6.5 g/dL — CL (ref 12.0–15.0)
Immature Granulocytes: 0 %
Lymphocytes Relative: 32 %
Lymphs Abs: 2.1 10*3/uL (ref 0.7–4.0)
MCH: 29.1 pg (ref 26.0–34.0)
MCHC: 31 g/dL (ref 30.0–36.0)
MCV: 94.2 fL (ref 80.0–100.0)
Monocytes Absolute: 0.8 10*3/uL (ref 0.1–1.0)
Monocytes Relative: 12 %
Neutro Abs: 3.5 10*3/uL (ref 1.7–7.7)
Neutrophils Relative %: 55 %
Platelets: 246 10*3/uL (ref 150–400)
RBC: 2.23 MIL/uL — ABNORMAL LOW (ref 3.87–5.11)
RDW: 15.5 % (ref 11.5–15.5)
WBC: 6.4 10*3/uL (ref 4.0–10.5)
nRBC: 0 % (ref 0.0–0.2)

## 2019-07-21 LAB — CBG MONITORING, ED: Glucose-Capillary: 74 mg/dL (ref 70–99)

## 2019-07-21 LAB — PREPARE RBC (CROSSMATCH)

## 2019-07-21 LAB — SARS CORONAVIRUS 2 BY RT PCR (HOSPITAL ORDER, PERFORMED IN ~~LOC~~ HOSPITAL LAB): SARS Coronavirus 2: NEGATIVE

## 2019-07-21 MED ORDER — ONDANSETRON HCL 4 MG PO TABS
4.0000 mg | ORAL_TABLET | Freq: Four times a day (QID) | ORAL | Status: DC | PRN
  Administered 2019-07-22: 4 mg via ORAL
  Filled 2019-07-21: qty 1

## 2019-07-21 MED ORDER — OXYCODONE HCL 5 MG PO TABS
5.0000 mg | ORAL_TABLET | Freq: Four times a day (QID) | ORAL | Status: DC | PRN
  Administered 2019-07-22 – 2019-07-24 (×5): 5 mg via ORAL
  Filled 2019-07-21 (×7): qty 1

## 2019-07-21 MED ORDER — SODIUM CHLORIDE 0.9 % IV SOLN: 10.0000 mL/h | Freq: Once | INTRAVENOUS | Status: DC

## 2019-07-21 MED ORDER — NYSTATIN 100000 UNIT/ML MT SUSP: 5.0000 mL | Freq: Four times a day (QID) | OROMUCOSAL | Status: DC

## 2019-07-21 MED ORDER — SIMETHICONE 80 MG PO CHEW
80.0000 mg | CHEWABLE_TABLET | Freq: Four times a day (QID) | ORAL | Status: DC | PRN
  Filled 2019-07-21: qty 1

## 2019-07-21 MED ORDER — LOPERAMIDE HCL 2 MG PO CAPS: 2.0000 mg | ORAL_CAPSULE | Freq: Three times a day (TID) | ORAL | Status: DC | PRN

## 2019-07-21 MED ORDER — MAGNESIUM OXIDE 400 (241.3 MG) MG PO TABS
400.0000 mg | ORAL_TABLET | Freq: Two times a day (BID) | ORAL | Status: DC
  Administered 2019-07-22 – 2019-07-25 (×5): 400 mg via ORAL
  Filled 2019-07-21 (×5): qty 1

## 2019-07-21 MED ORDER — ASCORBIC ACID 500 MG PO TABS
500.0000 mg | ORAL_TABLET | Freq: Two times a day (BID) | ORAL | Status: DC
  Administered 2019-07-22 – 2019-07-25 (×7): 500 mg via ORAL
  Filled 2019-07-21 (×7): qty 1

## 2019-07-21 MED ORDER — METOPROLOL TARTRATE 50 MG PO TABS
50.0000 mg | ORAL_TABLET | Freq: Two times a day (BID) | ORAL | Status: DC
  Administered 2019-07-22 – 2019-07-25 (×7): 50 mg via ORAL
  Filled 2019-07-21: qty 2
  Filled 2019-07-21: qty 1
  Filled 2019-07-21 (×2): qty 2
  Filled 2019-07-21: qty 1
  Filled 2019-07-21: qty 2
  Filled 2019-07-21: qty 1

## 2019-07-21 MED ORDER — ESCITALOPRAM OXALATE 20 MG PO TABS
20.0000 mg | ORAL_TABLET | Freq: Every day | ORAL | Status: DC
  Administered 2019-07-22 – 2019-07-25 (×4): 20 mg via ORAL
  Filled 2019-07-21 (×2): qty 2
  Filled 2019-07-21 (×2): qty 1

## 2019-07-21 MED ORDER — PANTOPRAZOLE SODIUM 40 MG PO TBEC: 40.0000 mg | DELAYED_RELEASE_TABLET | Freq: Every day | ORAL | Status: DC

## 2019-07-21 MED ORDER — ACETAMINOPHEN 650 MG RE SUPP: 650.0000 mg | Freq: Four times a day (QID) | RECTAL | Status: DC | PRN

## 2019-07-21 MED ORDER — AMLODIPINE BESYLATE 5 MG PO TABS
5.0000 mg | ORAL_TABLET | Freq: Every day | ORAL | Status: DC
  Administered 2019-07-22 – 2019-07-25 (×3): 5 mg via ORAL
  Filled 2019-07-21 (×3): qty 1

## 2019-07-21 MED ORDER — ESCITALOPRAM OXALATE 10 MG PO TABS: 10.0000 mg | ORAL_TABLET | Freq: Every day | ORAL | Status: DC

## 2019-07-21 MED ORDER — PANTOPRAZOLE SODIUM 40 MG IV SOLR
40.0000 mg | Freq: Two times a day (BID) | INTRAVENOUS | Status: DC
  Administered 2019-07-22 – 2019-07-25 (×7): 40 mg via INTRAVENOUS
  Filled 2019-07-21 (×7): qty 40

## 2019-07-21 MED ORDER — ACETAMINOPHEN 325 MG PO TABS
650.0000 mg | ORAL_TABLET | Freq: Four times a day (QID) | ORAL | Status: DC | PRN
  Filled 2019-07-21: qty 2

## 2019-07-21 MED ORDER — MELATONIN 3 MG PO TABS
3.0000 mg | ORAL_TABLET | Freq: Every day | ORAL | Status: DC
  Administered 2019-07-22 – 2019-07-24 (×3): 3 mg via ORAL
  Filled 2019-07-21 (×3): qty 1

## 2019-07-21 MED ORDER — MORPHINE SULFATE (PF) 2 MG/ML IV SOLN: 1.0000 mg | INTRAVENOUS | Status: DC | PRN

## 2019-07-21 MED ORDER — INSULIN ASPART 100 UNIT/ML ~~LOC~~ SOLN
0.0000 [IU] | Freq: Three times a day (TID) | SUBCUTANEOUS | Status: DC
  Filled 2019-07-21: qty 0.09

## 2019-07-21 MED ORDER — FERROUS SULFATE 300 (60 FE) MG/5ML PO SYRP: 300.0000 mg | ORAL_SOLUTION | Freq: Every day | ORAL | Status: DC

## 2019-07-21 MED ORDER — GABAPENTIN 300 MG PO CAPS
300.0000 mg | ORAL_CAPSULE | Freq: Two times a day (BID) | ORAL | Status: DC
  Administered 2019-07-22 – 2019-07-25 (×7): 300 mg via ORAL
  Filled 2019-07-21 (×7): qty 1
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
## 2019-07-21 NOTE — Progress Notes (Signed)
PHARMACY - TOTAL PARENTERAL NUTRITION CONSULT NOTE   Indication: Fistula  Patient Measurements: Height: 5\' 2"  (157.5 cm) Weight: 61 kg (134 lb 7.7 oz) IBW/kg (Calculated) : 50.1 TPN AdjBW (KG): 61 Body mass index is 24.6 kg/m. Usual Weight: fluctuates between 55 and 61 kg since Oct 2020  Assessment: 59 yo female hx of perforated peptic ulcer s/p [name] patch Nov 3976 complicated by [facility] fistula s/p IR drainage now on chronic TPN since early 2021. PMH includes DM2, HTN, osteomyelitis with bilateral BKA's, schizophrenia, recent admissions for fungal peritonitis and MRSA bacteremia requiring PICC replacement. Admitted for the second time in 30 days with anemia and weakness. Pharmacy consulted to continue managing home TPN.  Glucose / Insulin:  Previously moderately well controlled (ranging from 100s to low 200s, no lows) Electrolytes: WNL on admission Renal: SCr, bicarb WNL; BUN elevated c/w occult GIB (had +FOBT last admission; not ordered yet this admission) LFTs / TGs: pending Prealbumin / albumin: pending Intake / Output; MIVF: abdominal drains removed during most recent admission, although still charted as in place in Epic GI Imaging: possible EGD in AM Surgeries / Procedures: n/a  Central access: PICC replaced 05/29/19 TPN start date: chronic, on PTA; resuming 5/11 as inpatient  Nutritional Goals   [facility] office closed; will contact in AM for most recent TPN formulation  Will also consult RD while inpatient; recs pending  Current Nutrition:  Clear liquids, expect can resume home TPN at goal rate tomorrow  Plan:  F/u AM labs, updated TPN formulation, and RD recs; start TPN as clinically appropriate  Kayleann Mccaffery A 07/21/2019,7:37 PM

## 2019-07-21 NOTE — ED Triage Notes (Signed)
Pt arrived via [facility] from home.    Home health come every Thursday and was told her hemoglobin 6.9 and needed to come to ED on Friday. Patient did not want to come to ED and waiting until today.   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   BP-156/82 HR-78 RR-18 98% RA CBG-102 98 temp   A/Ox4

## 2019-07-21 NOTE — ED Notes (Signed)
Date and time results received: 07/21/19 1:53 PM  (use smartphrase ".now" to insert current time)  Test: Hgb Critical Value: 6.5  Name of Provider Notified: [place]  Orders Received? Or Actions Taken?: Orders Received - See Orders for details

## 2019-07-21 NOTE — Progress Notes (Addendum)
History and Physical    DOA: 07/21/2019  PCP: [name], MD  Patient coming from: home  Chief Complaint: weakness, abnormal labs  HPI: Gina [name] is a 59 y.o. female with history h/o  diabetes, hypertension, PAD,  bilateral BKA, osteomyelitis, schizophrenia, perforated peptic ulcer s/p [name] patch Nov 0240 complicated by [facility] fistula requiring IR drain, now on chronic TPN since early 2021,  history of MRSA bacteremia in March 2021, chronic decubiti who was  recently admitted to this [facility] 4/23-4/27/2021 for dyspepsia/anemia, presented again today to ED with complaints of generalized weakness and after outpatient lab work revealed worsening hemoglobin level.   Patient received 2 units of PRBC in April, was seen by GI and underwent CT abdomen/pelvis for abdominal pain work-up but EGD was deferred by GI in previous admission. Patient was discharged on iron supplements and hemoglobin improved to 9 posttransfusion.  Of note, patient has a prior history of duodenal ulcer with [name] patch repair done in November 9735 complicated by enterocutaneous fistula requiring IR drain placement.  This was followed by peritonitis/ candidemia.  CT abdomen/pelvis done in April redemonstrated air/contrast containing ventral peritoneal cavity with pigtail drain--General surgery was consulted and IR drain was removed prior to discharge.   Patient was set up for home health visits and had blood work done past Thursday--which resulted at hemoglobin level 6.5.  She denies any complaints except for generalized weakness and pain at decubiti site.  She was referred to ED for further evaluation.  She did have guaiac positive stools in the last admission. Denies melena/hematochezia/hematemesis Review of Systems: As per HPI otherwise 10 point review of systems negative except for pain at pressure wound site.  ED work-up: Temperature 98.7, pulse 83, respiratory rate 18, blood pressure 104/74, O2 sat 100% on room  air.  WBC 6.4, hemoglobin 6.5, hematocrit 21, MCV 94, platelet 246, sodium 138, potassium 3.8, bicarb 24, BUN 41, creatinine 1.74, calcium 8.3.  Has orders for type and crossmatch, 2 units of PRBC transfusion through ED and requested to be admitted to [facility] service for further evaluation and management.   Past Medical History:  Diagnosis Date  . Diabetes mellitus   . Hypertension   . Osteomyelitis of right foot ([place]) 02/22/2017  . Perforated gastric ulcer ([place])   . S/P bilateral BKA (below knee amputation) ([place])   . Schizophrenia ([place])   . Schizophrenia ([place])   . Septic arthritis of interphalangeal joint of toe of left foot ([place]) 06/02/2016  . Stress incontinence 04/26/2017    Past Surgical History:  Procedure Laterality Date  . AMPUTATION Left 06/05/2016   Procedure: LEFT FOOT TRANSMETATARSAL AMPUTATION;  Surgeon: [name], MD;  [facility];  Service: Orthopedics;  Laterality: Left;  . AMPUTATION Right 11/11/2017   Procedure: AMPUTATION BELOW KNEE;  Surgeon: [name], MD;  Location: [place];  Service: Orthopedics;  Laterality: Right;  . AMPUTATION Left 05/10/2018   Procedure: LEFT BELOW KNEE AMPUTATION;  Surgeon: [name], MD;  Location: [place];  Service: Orthopedics;  Laterality: Left;  . AMPUTATION Left 12/28/2018   Procedure: AMPUTATION THIRD DIGIT;  Surgeon: Iran Planas, MD;  [facility];  Service: Orthopedics;  Laterality: Left;  . AMPUTATION TOE Right 02/23/2017   Procedure: AMPUTATION RIGHT THIRD TOE;  Surgeon: [name], MD;  Location: [place];  Service: Orthopedics;  Laterality: Right;  . I & D EXTREMITY Right 11/09/2017   Procedure: Debride Ulcer Right Heel;  Surgeon: [name], MD;  Location: [place];  Service: Orthopedics;  Laterality: Right;  . I & D EXTREMITY Left 12/28/2018   Procedure: IRRIGATION AND DEBRIDEMENT EXTREMITY;  Surgeon: Iran Planas, MD;  [facility];  Service: Orthopedics;  Laterality: Left;  . INCISION AND DRAINAGE OF WOUND  Left 12/26/2018   Procedure: IRRIGATION AND DEBRIDEMENT LEFT HAND;  Surgeon: Iran Planas, MD;  [facility];  Service: Orthopedics;  Laterality: Left;  . LAPAROTOMY N/A 02/02/2019   Procedure: EXPLORATORY LAPAROTOMY WITH OVERSEW OF DUODENAL ULCER;  Surgeon: Alphonsa Overall, MD;  [facility];  Service: General;  Laterality: N/A;  . TEE WITHOUT CARDIOVERSION N/A 05/27/2019   Procedure: TRANSESOPHAGEAL ECHOCARDIOGRAM (TEE);  Surgeon: Skeet Latch, MD;  Location: [place];  Service: Cardiovascular;  Laterality: N/A;  . TUBAL LIGATION      Social history:  reports that she has never smoked. She has never used smokeless tobacco. She reports that she does not drink alcohol or use drugs.   Allergies  Allergen Reactions  . Bee Venom Swelling    Swells up with bee stings   . Metformin And Related Other (See Comments)    Upset stomach    Family History  Problem Relation Age of Onset  . Diabetes type II Father   . CAD Father   . Prostate cancer Father   . Diabetes Mellitus II Brother       Prior to Admission medications   Medication Sig Start Date End Date Taking? Authorizing Provider  amLODipine (NORVASC) 5 MG tablet Take 5 mg by mouth daily.   Yes [provider]  ascorbic acid (VITAMIN C) 500 MG tablet Take 1 tablet (500 mg total) by mouth 2 (two) times daily. 06/10/19  Yes Shelly Coss, MD  escitalopram (LEXAPRO) 20 MG tablet Take 20 mg by mouth daily.   Yes [provider]  gabapentin (NEURONTIN) 300 MG capsule Take 1 capsule (300 mg total) by mouth 2 (two) times daily. 06/10/19  Yes Shelly Coss, MD  loperamide (IMODIUM) 2 MG capsule Take 1 capsule (2 mg total) by mouth every 8 (eight) hours as needed for diarrhea or loose stools. 07/07/19  Yes Regalado, Belkys A, MD  melatonin 3 MG TABS tablet Take 1 tablet (3 mg total) by mouth at bedtime. 06/10/19  Yes Shelly Coss, MD  metoprolol tartrate (LOPRESSOR) 50 MG tablet Take 1 tablet (50 mg total)  by mouth 2 (two) times daily. 06/10/19  Yes Shelly Coss, MD  Nutritional Supplements (ENSURE ENLIVE PO) Take 237 mLs by mouth 2 (two) times daily with a meal.   Yes [provider]  ondansetron (ZOFRAN) 4 MG tablet Take 1 tablet (4 mg total) by mouth every 6 (six) hours as needed for nausea. 06/10/19  Yes Shelly Coss, MD  oxyCODONE (OXY IR/ROXICODONE) 5 MG immediate release tablet Take 1 tablet (5 mg total) by mouth every 6 (six) hours as needed (or pain). 06/10/19  Yes Shelly Coss, MD  pantoprazole (PROTONIX) 40 MG tablet Take 1 tablet (40 mg total) by mouth daily. 06/10/19  Yes Shelly Coss, MD  simethicone (MYLICON) 80 MG chewable tablet Chew 80 mg by mouth 4 (four) times daily as needed (indigestion).    Yes [provider]  amLODipine (NORVASC) 5 MG tablet Take 1 tablet (5 mg total) by mouth daily. 06/10/19 07/10/19  Shelly Coss, MD  escitalopram (LEXAPRO) 10 MG tablet Take 1 tablet (10 mg total) by mouth daily. Patient not taking: Reported on 07/21/2019 06/10/19   Shelly Coss, MD  ferrous  sulfate 300 (60 Fe) MG/5ML syrup Take 5 mLs (300 mg total) by mouth daily with breakfast. 07/07/19   Regalado, Belkys A, MD  Magnesium 400 MG CAPS Take 400 mg by mouth 2 (two) times daily. 07/07/19   Regalado, Belkys A, MD  nystatin (MYCOSTATIN) 100000 UNIT/ML suspension Take 5 mLs (500,000 Units total) by mouth 4 (four) times daily. Patient not taking: Reported on 07/21/2019 07/07/19   Regalado, Jerald Kief A, MD  sodium chloride (OCEAN) 0.65 % SOLN nasal spray Place 1 spray into both nostrils every 4 (four) hours as needed (dry nasal membranes).    [provider]    Physical Exam: Vitals:   07/21/19 1500 07/21/19 1530 07/21/19 1600 07/21/19 1630  BP: 120/74 115/89 (!) 150/83 104/74  Pulse: 85 86 81 83  Resp: 20 (!) 24 20 18   Temp:      TempSrc:      SpO2: 93% 95% 98% 100%  Weight:      Height:        Constitutional: chronically debilitated female in mild  distress due to sacral pain at decubiti site Eyes: PERRL, lids and conjunctivae pale ENMT: Mucous membranes are moist. Posterior pharynx clear of any exudate or lesions.Normal dentition.  Neck: normal, supple, no masses, no thyromegaly Respiratory: clear to auscultation bilaterally, no wheezing, no crackles. Normal respiratory effort. Cardiovascular: Regular rate and rhythm, no murmurs / rubs / gallops.  No carotid bruits.  Abdomen: s/p colosctomy with nl output, no tenderness, no masses palpated. No hepatosplenomegaly. Bowel sounds positive. Green stool noted in diaper Musculoskeletal: s/p bilateral BKA with dressing at stump wounds Neurologic: CN 2-12 grossly intact. Sensation intact, DTR normal. Strength 5/5 in all 4.  Psychiatric: Normal judgment and insight. Alert and oriented x 3. Normal mood.  SKIN/catheters: Dressing along BLE stump wounds, sacral/coccygeal decubiti with no signs of infection/bleeding  Labs on Admission: I have personally reviewed following labs and imaging studies  CBC: Recent Labs  Lab 07/21/19 1320  WBC 6.4  NEUTROABS 3.5  HGB 6.5*  HCT 21.0*  MCV 94.2  PLT 712   Basic Metabolic Panel: Recent Labs  Lab 07/21/19 1320  NA 138  K 3.8  CL 106  CO2 24  GLUCOSE 73  BUN 41*  CREATININE 0.74  CALCIUM 8.3*   GFR: Estimated Creatinine Clearance: 65.9 mL/min (by C-G formula based on SCr of 0.74 mg/dL). Recent Labs  Lab 07/21/19 1320  WBC 6.4   Liver Function Tests: No results for input(s): AST, ALT, ALKPHOS, BILITOT, PROT, ALBUMIN in the last 168 hours. No results for input(s): LIPASE, AMYLASE in the last 168 hours. No results for input(s): AMMONIA in the last 168 hours. Coagulation Profile: No results for input(s): INR, PROTIME in the last 168 hours. Cardiac Enzymes: No results for input(s): CKTOTAL, CKMB, CKMBINDEX, TROPONINI in the last 168 hours. BNP (last 3 results) No results for input(s): PROBNP in the last 8760 hours. HbA1C: No results  for input(s): HGBA1C in the last 72 hours. CBG: No results for input(s): GLUCAP in the last 168 hours. Lipid Profile: No results for input(s): CHOL, HDL, LDLCALC, TRIG, CHOLHDL, LDLDIRECT in the last 72 hours. Thyroid Function Tests: No results for input(s): TSH, T4TOTAL, FREET4, T3FREE, THYROIDAB in the last 72 hours. Anemia Panel: No results for input(s): VITAMINB12, FOLATE, FERRITIN, TIBC, IRON, RETICCTPCT in the last 72 hours. Urine analysis:    Component Value Date/Time   COLORURINE YELLOW 07/04/2019 1754   APPEARANCEUR CLOUDY (A) 07/04/2019 1754   LABSPEC 1.016 07/04/2019  [place] 7.0 07/04/2019 1754   GLUCOSEU NEGATIVE 07/04/2019 1754   HGBUR SMALL (A) 07/04/2019 1754   BILIRUBINUR NEGATIVE 07/04/2019 1754   BILIRUBINUR N 04/28/2016 1603   [place] 07/04/2019 1754   PROTEINUR >=300 (A) 07/04/2019 1754   UROBILINOGEN 0.2 04/28/2016 1603   UROBILINOGEN 0.2 06/18/2013 0650   NITRITE NEGATIVE 07/04/2019 1754   LEUKOCYTESUR LARGE (A) 07/04/2019 1754    Radiological Exams on Admission: Personally reviewed  No results found.  EKG: Not done today.    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  Recurrent symptomatic iron deficiency anemia: Secondary to anemia of chronic disease versus chronic GI losses versus malnutrition.  MCV within normal limits but noted to have iron deficiency on lab work in previous admission.  Also with guaiac positive stools recently--although could be related to multiple chronic GI issues.  Given recurrent anemia, will consult GI for EGD eval in a.m.  Transfused 2 units PRBC as ordered through  ED.  IV PPI.  CLD-n.p.o. after midnight for possible EGD in a.m.  2.  History of perforated gastric ulcer/[place] patch: On PPI oral at home.  IV PPI given problem #1.  GI to evaluate and suggest further work-up.  3.  Diabetes mellitus:?  Diet controlled ?  Resolved.  Not on any insulin or oral hypoglycemics.  Last hemoglobin A1c in March and April less than 6.2.  (Was 15.2 in hospitalization 01/2019, also has h/o DKA in the past). SSI for now  4.  Hypertension: Resume home medications  5.  History of complicated peritonitis/candidemia/exploratory laparotomy: S/p IR abdominal drain removal in recent admission.   6.  Peripheral arterial disease/osteomyelitis/bilateral BKA, stump wounds: follows wound care/resume home medications.  7.  Pressure injury, bilateral coccyx stage II: Resume wound care. Pain control  8.  Severe protein calorie malnutrition: On TPN  DVT prophylaxis: S/P bilateral BKA. Anemia precludes anticoagulants  COVID screen: negative  Code Status: DNR as confirmed by patient.Health care proxy would be her daughter --unable to reach her , left message  Patient/Family Communication: Discussed with patient and all questions answered to satisfaction.  Consults called: [facility] consulted Admission status : Patient will be admitted under OBSERVATION status.The patient's presenting symptoms, physical exam findings, and initial radiographic and laboratory data in the context of their medical condition is felt to place them at low risk for further clinical deterioration. Furthermore, it is anticipated that the patient will be medically stable for discharge from the hospital within 2 midnights of hospital stay.      Guilford Shi MD [facility] Pager in [place]  If 7PM-7AM, please contact night-coverage [email or web address]   07/21/2019, 4:55 PM

## 2019-07-22 ENCOUNTER — Telehealth: Payer: Self-pay

## 2019-07-22 ENCOUNTER — Telehealth: Payer: Self-pay | Admitting: Physician Assistant

## 2019-07-22 DIAGNOSIS — F320 Major depressive disorder, single episode, mild: Secondary | ICD-10-CM

## 2019-07-22 DIAGNOSIS — F209 Schizophrenia, unspecified: Secondary | ICD-10-CM | POA: Diagnosis present

## 2019-07-22 DIAGNOSIS — E1169 Type 2 diabetes mellitus with other specified complication: Secondary | ICD-10-CM | POA: Diagnosis present

## 2019-07-22 DIAGNOSIS — Z66 Do not resuscitate: Secondary | ICD-10-CM | POA: Diagnosis present

## 2019-07-22 DIAGNOSIS — K256 Chronic or unspecified gastric ulcer with both hemorrhage and perforation: Secondary | ICD-10-CM | POA: Diagnosis not present

## 2019-07-22 DIAGNOSIS — Z833 Family history of diabetes mellitus: Secondary | ICD-10-CM | POA: Diagnosis not present

## 2019-07-22 DIAGNOSIS — D649 Anemia, unspecified: Secondary | ICD-10-CM | POA: Diagnosis present

## 2019-07-22 DIAGNOSIS — E43 Unspecified severe protein-calorie malnutrition: Secondary | ICD-10-CM | POA: Diagnosis present

## 2019-07-22 DIAGNOSIS — Z79899 Other long term (current) drug therapy: Secondary | ICD-10-CM | POA: Diagnosis not present

## 2019-07-22 DIAGNOSIS — Z6824 Body mass index (BMI) 24.0-24.9, adult: Secondary | ICD-10-CM | POA: Diagnosis not present

## 2019-07-22 DIAGNOSIS — Z89512 Acquired absence of left leg below knee: Secondary | ICD-10-CM | POA: Diagnosis not present

## 2019-07-22 DIAGNOSIS — L89893 Pressure ulcer of other site, stage 3: Secondary | ICD-10-CM | POA: Diagnosis present

## 2019-07-22 DIAGNOSIS — F329 Major depressive disorder, single episode, unspecified: Secondary | ICD-10-CM | POA: Diagnosis present

## 2019-07-22 DIAGNOSIS — Z8249 Family history of ischemic heart disease and other diseases of the circulatory system: Secondary | ICD-10-CM | POA: Diagnosis not present

## 2019-07-22 DIAGNOSIS — D500 Iron deficiency anemia secondary to blood loss (chronic): Secondary | ICD-10-CM | POA: Diagnosis present

## 2019-07-22 DIAGNOSIS — Z91030 Bee allergy status: Secondary | ICD-10-CM | POA: Diagnosis not present

## 2019-07-22 DIAGNOSIS — Z79891 Long term (current) use of opiate analgesic: Secondary | ICD-10-CM | POA: Diagnosis not present

## 2019-07-22 DIAGNOSIS — E1100 Type 2 diabetes mellitus with hyperosmolarity without nonketotic hyperglycemic-hyperosmolar coma (NKHHC): Secondary | ICD-10-CM | POA: Diagnosis present

## 2019-07-22 DIAGNOSIS — L89153 Pressure ulcer of sacral region, stage 3: Secondary | ICD-10-CM | POA: Diagnosis present

## 2019-07-22 DIAGNOSIS — E1151 Type 2 diabetes mellitus with diabetic peripheral angiopathy without gangrene: Secondary | ICD-10-CM | POA: Diagnosis present

## 2019-07-22 DIAGNOSIS — I10 Essential (primary) hypertension: Secondary | ICD-10-CM | POA: Diagnosis present

## 2019-07-22 DIAGNOSIS — Z888 Allergy status to other drugs, medicaments and biological substances status: Secondary | ICD-10-CM | POA: Diagnosis not present

## 2019-07-22 DIAGNOSIS — Z8042 Family history of malignant neoplasm of prostate: Secondary | ICD-10-CM | POA: Diagnosis not present

## 2019-07-22 DIAGNOSIS — Z89519 Acquired absence of unspecified leg below knee: Secondary | ICD-10-CM | POA: Diagnosis not present

## 2019-07-22 DIAGNOSIS — Z8614 Personal history of Methicillin resistant Staphylococcus aureus infection: Secondary | ICD-10-CM | POA: Diagnosis not present

## 2019-07-22 DIAGNOSIS — Z89511 Acquired absence of right leg below knee: Secondary | ICD-10-CM | POA: Diagnosis not present

## 2019-07-22 DIAGNOSIS — Z20822 Contact with and (suspected) exposure to covid-19: Secondary | ICD-10-CM | POA: Diagnosis present

## 2019-07-22 LAB — CBC
HCT: 28.5 % — ABNORMAL LOW (ref 36.0–46.0)
Hemoglobin: 9.2 g/dL — ABNORMAL LOW (ref 12.0–15.0)
MCH: 27.8 pg (ref 26.0–34.0)
MCHC: 32.3 g/dL (ref 30.0–36.0)
MCV: 86.1 fL (ref 80.0–100.0)
Platelets: 260 10*3/uL (ref 150–400)
RBC: 3.31 MIL/uL — ABNORMAL LOW (ref 3.87–5.11)
RDW: 17.2 % — ABNORMAL HIGH (ref 11.5–15.5)
WBC: 7.1 10*3/uL (ref 4.0–10.5)
nRBC: 0 % (ref 0.0–0.2)

## 2019-07-22 LAB — TYPE AND SCREEN
ABO/RH(D): O POS
Antibody Screen: NEGATIVE
Unit division: 0
Unit division: 0

## 2019-07-22 LAB — COMPREHENSIVE METABOLIC PANEL
ALT: 13 U/L (ref 0–44)
AST: 17 U/L (ref 15–41)
Albumin: 2 g/dL — ABNORMAL LOW (ref 3.5–5.0)
Alkaline Phosphatase: 69 U/L (ref 38–126)
Anion gap: 5 (ref 5–15)
BUN: 31 mg/dL — ABNORMAL HIGH (ref 6–20)
CO2: 24 mmol/L (ref 22–32)
Calcium: 8 mg/dL — ABNORMAL LOW (ref 8.9–10.3)
Chloride: 105 mmol/L (ref 98–111)
Creatinine, Ser: 0.62 mg/dL (ref 0.44–1.00)
GFR calc Af Amer: >60 mL/min (ref >60)
GFR calc non Af Amer: >60 mL/min (ref >60)
Glucose, Bld: 67 mg/dL — ABNORMAL LOW (ref 70–99)
Potassium: 3.7 mmol/L (ref 3.5–5.1)
Sodium: 134 mmol/L — ABNORMAL LOW (ref 135–145)
Total Bilirubin: 0.6 mg/dL (ref 0.3–1.2)
Total Protein: 6.3 g/dL — ABNORMAL LOW (ref 6.5–8.1)

## 2019-07-22 LAB — DIFFERENTIAL
Abs Immature Granulocytes: 0.01 10*3/uL (ref 0.00–0.07)
Basophils Absolute: 0 10*3/uL (ref 0.0–0.1)
Basophils Relative: 0 %
Eosinophils Absolute: 0.1 10*3/uL (ref 0.0–0.5)
Eosinophils Relative: 2 %
Immature Granulocytes: 0 %
Lymphocytes Relative: 36 %
Lymphs Abs: 2.5 10*3/uL (ref 0.7–4.0)
Monocytes Absolute: 1 10*3/uL (ref 0.1–1.0)
Monocytes Relative: 15 %
Neutro Abs: 3.4 10*3/uL (ref 1.7–7.7)
Neutrophils Relative %: 47 %

## 2019-07-22 LAB — CBG MONITORING, ED
Glucose-Capillary: 69 mg/dL — ABNORMAL LOW (ref 70–99)
Glucose-Capillary: 107 mg/dL — ABNORMAL HIGH (ref 70–99)
Glucose-Capillary: 75 mg/dL (ref 70–99)

## 2019-07-22 LAB — BPAM RBC
Blood Product Expiration Date: 202106102359
Blood Product Expiration Date: 202106102359
ISSUE DATE / TIME: 202105101746
ISSUE DATE / TIME: 202105102047
Unit Type and Rh: 5100
Unit Type and Rh: 5100

## 2019-07-22 LAB — GLUCOSE, CAPILLARY
Glucose-Capillary: 65 mg/dL — ABNORMAL LOW (ref 70–99)
Glucose-Capillary: 78 mg/dL (ref 70–99)
Glucose-Capillary: 145 mg/dL — ABNORMAL HIGH (ref 70–99)

## 2019-07-22 LAB — MAGNESIUM: Magnesium: 1.6 mg/dL — ABNORMAL LOW (ref 1.7–2.4)

## 2019-07-22 LAB — PHOSPHORUS: Phosphorus: 3.4 mg/dL (ref 2.5–4.6)

## 2019-07-22 LAB — PREALBUMIN: Prealbumin: 8.8 mg/dL — ABNORMAL LOW (ref 18–38)

## 2019-07-22 LAB — TRIGLYCERIDES: Triglycerides: 118 mg/dL (ref <150)

## 2019-07-22 MED ORDER — PEG 3350-KCL-NA BICARB-NACL 420 G PO SOLR
4000.0000 mL | Freq: Once | ORAL | Status: AC
  Administered 2019-07-22: 4000 mL via ORAL

## 2019-07-22 MED ORDER — CHLORHEXIDINE GLUCONATE CLOTH 2 % EX PADS
6.0000 | MEDICATED_PAD | Freq: Every day | CUTANEOUS | Status: DC
  Administered 2019-07-22 – 2019-07-25 (×3): 6 via TOPICAL

## 2019-07-22 MED ORDER — SODIUM CHLORIDE 0.9% FLUSH
10.0000 mL | INTRAVENOUS | Status: DC | PRN
  Administered 2019-07-25: 10 mL

## 2019-07-22 MED ORDER — DEXTROSE-NACL 5-0.9 % IV SOLN: INTRAVENOUS | Status: DC

## 2019-07-22 MED ORDER — LIP MEDEX EX OINT
TOPICAL_OINTMENT | CUTANEOUS | Status: DC | PRN
  Filled 2019-07-22: qty 7

## 2019-07-22 MED ORDER — SODIUM CHLORIDE 0.9 % IV SOLN: INTRAVENOUS | Status: DC

## 2019-07-22 MED ORDER — FERROUS SULFATE 300 (60 FE) MG/5ML PO SYRP
300.0000 mg | ORAL_SOLUTION | Freq: Every day | ORAL | Status: DC
  Administered 2019-07-23 – 2019-07-25 (×2): 300 mg via ORAL
  Filled 2019-07-22 (×4): qty 5

## 2019-07-22 MED ORDER — TRAVASOL 10 % IV SOLN
INTRAVENOUS | Status: AC
  Filled 2019-07-22: qty 900

## 2019-07-22 MED ORDER — MAGNESIUM SULFATE IN D5W 1-5 GM/100ML-% IV SOLN
1.0000 g | Freq: Once | INTRAVENOUS | Status: AC
  Administered 2019-07-22: 1 g via INTRAVENOUS
  Filled 2019-07-22: qty 100

## 2019-07-22 MED ORDER — DEXTROSE 50 % IV SOLN
25.0000 mL | Freq: Once | INTRAVENOUS | Status: AC
  Administered 2019-07-22: 25 mL via INTRAVENOUS
  Filled 2019-07-22: qty 50

## 2019-07-22 MED ORDER — INSULIN ASPART 100 UNIT/ML ~~LOC~~ SOLN
0.0000 [IU] | SUBCUTANEOUS | Status: DC
  Filled 2019-07-22: qty 0.09

## 2019-07-22 NOTE — Telephone Encounter (Signed)
A new hem appt has been scheduled for Gina [name] to see Cassi on 5/18 at 1:30pm w/labs at 1pm. Pt is currently in the hospital.

## 2019-07-22 NOTE — Telephone Encounter (Signed)
NOTES ON FILE FROM [facility] [phone], SENT REFERRAL TO SCHEDULING 

## 2019-07-22 NOTE — H&P (View-Only) (Signed)
Referring Provider: Dr. Earnest Conroy Primary Care Physician:  [name], MD Primary Gastroenterologist:  Althia Forts  Reason for Consultation:  Anemia  HPI: Gina [name] is a 59 y.o. 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 She has been feeling weak and nauseous but denies melena, hematochezia, or vomiting. Chronic abdominal pain and chronic loose stools into pouch and from rectum. She had a percutaneous drain for this enterocutaneous fistula as well that was removed last month while hospitalized. Hgb 6.6 last month and 9.1 at d/c. Hgb on presentation yesterday 6.5 and transfused to 9.2. Bilateral BKA. Denies alcohol. Not on NSAIDs. No history of previous EGD/colonoscopy.     Past Medical History:  Diagnosis Date  . Diabetes mellitus   . Hypertension   . Osteomyelitis of right foot ([place]) 02/22/2017  . Perforated gastric ulcer ([place])   . S/P bilateral BKA (below knee amputation) ([place])   . Schizophrenia ([place])   . Schizophrenia ([place])   . Septic arthritis of interphalangeal joint of toe of left foot ([place]) 06/02/2016  . Stress incontinence 04/26/2017    Past Surgical History:  Procedure Laterality Date  . AMPUTATION Left 06/05/2016   Procedure: LEFT FOOT TRANSMETATARSAL AMPUTATION;  Surgeon: [name], MD;  [facility];  Service: Orthopedics;  Laterality: Left;  . AMPUTATION Right 11/11/2017   Procedure: AMPUTATION BELOW KNEE;  Surgeon: [name], MD;  Location: [place];  Service: Orthopedics;  Laterality: Right;  . AMPUTATION Left 05/10/2018   Procedure: LEFT BELOW KNEE AMPUTATION;  Surgeon: [name], MD;  Location: [place];  Service: Orthopedics;  Laterality: Left;  . AMPUTATION Left 12/28/2018   Procedure: AMPUTATION THIRD DIGIT;  Surgeon: Iran Planas, MD;  [facility];  Service: Orthopedics;  Laterality: Left;   . AMPUTATION TOE Right 02/23/2017   Procedure: AMPUTATION RIGHT THIRD TOE;  Surgeon: [name], MD;  Location: [place];  Service: Orthopedics;  Laterality: Right;  . I & D EXTREMITY Right 11/09/2017   Procedure: Debride Ulcer Right Heel;  Surgeon: [name], MD;  Location: [place];  Service: Orthopedics;  Laterality: Right;  . I & D EXTREMITY Left 12/28/2018   Procedure: IRRIGATION AND DEBRIDEMENT EXTREMITY;  Surgeon: Iran Planas, MD;  [facility];  Service: Orthopedics;  Laterality: Left;  . INCISION AND DRAINAGE OF WOUND Left 12/26/2018   Procedure: IRRIGATION AND DEBRIDEMENT LEFT HAND;  Surgeon: Iran Planas, MD;  [facility];  Service: Orthopedics;  Laterality: Left;  . LAPAROTOMY N/A 02/02/2019   Procedure: EXPLORATORY LAPAROTOMY WITH OVERSEW OF DUODENAL ULCER;  Surgeon: Alphonsa Overall, MD;  [facility];  Service: General;  Laterality: N/A;  . TEE WITHOUT CARDIOVERSION N/A 05/27/2019   Procedure: TRANSESOPHAGEAL ECHOCARDIOGRAM (TEE);  Surgeon: Skeet Latch, MD;  Location: [place];  Service: Cardiovascular;  Laterality: N/A;  . TUBAL LIGATION      Prior to Admission medications   Medication Sig Start Date End Date Taking? Authorizing Provider  amLODipine (NORVASC) 5 MG tablet Take 5 mg by mouth daily.   Yes [provider]  ascorbic acid (VITAMIN C) 500 MG tablet Take 1 tablet (500 mg total) by mouth 2 (two) times daily. 06/10/19  Yes Shelly Coss, MD  escitalopram (LEXAPRO) 20 MG tablet Take 20 mg by mouth daily.   Yes [provider]  gabapentin (NEURONTIN) 300 MG capsule Take 1 capsule (  300 mg total) by mouth 2 (two) times daily. 06/10/19  Yes Shelly Coss, MD  loperamide (IMODIUM) 2 MG capsule Take 1 capsule (2 mg total) by mouth every 8 (eight) hours as needed for diarrhea or loose stools. 07/07/19  Yes Regalado, Belkys A, MD  melatonin 3 MG TABS tablet Take 1 tablet (3 mg total) by mouth at bedtime. 06/10/19  Yes Shelly Coss, MD   metoprolol tartrate (LOPRESSOR) 50 MG tablet Take 1 tablet (50 mg total) by mouth 2 (two) times daily. 06/10/19  Yes Shelly Coss, MD  Nutritional Supplements (ENSURE ENLIVE PO) Take 237 mLs by mouth 2 (two) times daily with a meal.   Yes [provider]  ondansetron (ZOFRAN) 4 MG tablet Take 1 tablet (4 mg total) by mouth every 6 (six) hours as needed for nausea. 06/10/19  Yes Shelly Coss, MD  oxyCODONE (OXY IR/ROXICODONE) 5 MG immediate release tablet Take 1 tablet (5 mg total) by mouth every 6 (six) hours as needed (or pain). 06/10/19  Yes Shelly Coss, MD  pantoprazole (PROTONIX) 40 MG tablet Take 1 tablet (40 mg total) by mouth daily. 06/10/19  Yes Shelly Coss, MD  simethicone (MYLICON) 80 MG chewable tablet Chew 80 mg by mouth 4 (four) times daily as needed (indigestion).    Yes [provider]  amLODipine (NORVASC) 5 MG tablet Take 1 tablet (5 mg total) by mouth daily. 06/10/19 07/10/19  Shelly Coss, MD  ferrous sulfate 300 (60 Fe) MG/5ML syrup Take 5 mLs (300 mg total) by mouth daily with breakfast. 07/07/19   Regalado, Belkys A, MD  Magnesium 400 MG CAPS Take 400 mg by mouth 2 (two) times daily. 07/07/19   Regalado, Belkys A, MD  nystatin (MYCOSTATIN) 100000 UNIT/ML suspension Take 5 mLs (500,000 Units total) by mouth 4 (four) times daily. Patient not taking: Reported on 07/21/2019 07/07/19   Regalado, Jerald Kief A, MD  sodium chloride (OCEAN) 0.65 % SOLN nasal spray Place 1 spray into both nostrils every 4 (four) hours as needed (dry nasal membranes).    [provider]    Scheduled Meds: . amLODipine  5 mg Oral Daily  . ascorbic acid  500 mg Oral BID  . escitalopram  20 mg Oral Daily  . ferrous sulfate  300 mg Oral Q breakfast  . gabapentin  300 mg Oral BID  . insulin aspart  0-9 Units Subcutaneous TID WC  . Magnesium  400 mg Oral BID  . melatonin  3 mg Oral QHS  . metoprolol tartrate  50 mg Oral BID  . pantoprazole (PROTONIX) IV  40 mg Intravenous  Q12H   Continuous Infusions: . sodium chloride    . magnesium sulfate bolus IVPB     PRN Meds:.acetaminophen **OR** acetaminophen, loperamide, morphine injection, ondansetron, oxyCODONE, simethicone  Allergies as of 07/21/2019 - Review Complete 07/21/2019  Allergen Reaction Noted  . Bee venom Swelling 12/26/2018  . Metformin and related Other (See Comments) 06/18/2013    Family History  Problem Relation Age of Onset  . Diabetes type II Father   . CAD Father   . Prostate cancer Father   . Diabetes Mellitus II Brother     Social History   Socioeconomic History  . Marital status: Single    Spouse name: Not on file  . Number of children: Not on file  . Years of education: Not on file  . Highest education level: Not on file  Occupational History  . Not on file  Tobacco Use  . Smoking  status: Never Smoker  . Smokeless tobacco: Never Used  Substance and Sexual Activity  . Alcohol use: No    Comment: occasionally  . Drug use: No  . Sexual activity: Not Currently  Other Topics Concern  . Not on file  Social History Narrative  . Not on file   Social Determinants of Health   Financial Resource Strain:   . Difficulty of Paying Living Expenses:   Food Insecurity:   . Worried About Charity fundraiser in the Last Year:   . Arboriculturist in the Last Year:   Transportation Needs:   . Film/video editor (Medical):   [name] Lack of Transportation (Non-Medical):   Physical Activity:   . Days of Exercise per Week:   . Minutes of Exercise per Session:   Stress:   . Feeling of Stress :   Social Connections:   . Frequency of Communication with Friends and Family:   . Frequency of Social Gatherings with Friends and Family:   . Attends Religious Services:   . Active Member of Clubs or Organizations:   . Attends Archivist Meetings:   [name] Marital Status:   Intimate Partner Violence:   . Fear of Current or Ex-Partner:   . Emotionally Abused:   [name] Physically Abused:    . Sexually Abused:     Review of Systems: All negative except as stated above in HPI.  Physical Exam: Vital signs: Vitals:   07/22/19 0800 07/22/19 0851  BP: (!) 144/89 (!) 150/73  Pulse: 66 66  Resp: 15 13  Temp:    SpO2: 97% 99%  T 98.6   General:   Lethargic, cachetic, frail, no acute distress  Head: normocephalic, atraumatic Eyes: anicteric sclera ENT: oropharynx clear, poor dentition Neck: supple, nontender Lungs:  Clear throughout to auscultation.   No wheezes, crackles, or rhonchi. No acute distress. Heart:  Regular rate and rhythm; no murmurs, clicks, rubs,  or gallops. Abdomen: diffuse tenderness with guarding, soft, nondistended, ostomy pouch in place with loose brown stool noted  Rectal:  Deferred Ext: bilateral BKA  GI:  Lab Results: Recent Labs    07/21/19 1320 07/22/19 0518  WBC 6.4 7.1  HGB 6.5* 9.2*  HCT 21.0* 28.5*  PLT 246 260   BMET Recent Labs    07/21/19 1320 07/22/19 0518  NA 138 134*  K 3.8 3.7  CL 106 105  CO2 24 24  GLUCOSE 73 67*  BUN 41* 31*  CREATININE 0.74 0.62  CALCIUM 8.3* 8.0*   LFT Recent Labs    07/22/19 0518  PROT 6.3*  ALBUMIN 2.0*  AST 17  ALT 13  ALKPHOS 69  BILITOT 0.6   PT/INR No results for input(s): LABPROT, INR in the last 72 hours.   Studies/Results: No results found.  Impression/Plan: 59 yo with recurrent anemia and heme positive last month with history of complicated perforated duodenal ulcer that was patched but developed enterocutaneous fistula. No overt bleeding but due to recurrent severe anemia needs inpt GI evaluation with EGD/colonoscopy to further evaluate. Colon prep today and tomorrow and will do procedures on 07/24/19. Clear liquid diet. Supportive care.    LOS: 0 days   Lear Ng  07/22/2019, 9:35 AM  Questions please call [phone]

## 2019-07-22 NOTE — Progress Notes (Signed)
PHARMACY - TOTAL PARENTER[facility] NUTRITION CONSULT NOTE   Indication: Fistula  Patient Measurements: Height: 5\' 2"  (157.5 cm) Weight: 61 kg (134 lb 7.7 oz) IBW/kg (Calculated) : 50.1 TPN AdjBW (KG): 61 Body mass index is 24.6 kg/m. Usual Weight: fluctuates between 55 and 61 kg since Oct 2020  Assessment: 59 yo female hx of perforated peptic ulcer s/p [name] patch Nov 7353 complicated by [facility] fistula s/p IR drainage now on chronic TPN since early 2021. PMH includes DM2, HTN, osteomyelitis with bilateral BKA's, schizophrenia, recent admissions for fungal peritonitis and MRSA bacteremia requiring PICC replacement. Admitted for the second time in 30 days with anemia and weakness. Pharmacy consulted to continue managing home TPN.  Glucose / Insulin:  Previously moderately well controlled (ranging from 100s to low 200s, no lows, mild hypoglycemia this admission requiring D50, D5NS started Electrolytes: WNL except Mg 1.6, Na 134; Corr Ca: 9.2 Renal: SCr, bicarb WNL; BUN elevated c/w occult GIB (had +FOBT last admission; not ordered yet this admission) LFTs / TGs: WNL Prealbumin / albumin: 8.8 (5/11)/2 (5/11) Intake / Output; MIVF: abdominal drains removed during most recent admission, although still charted as in place in Epic GI Imaging: possible EGD Surgeries / Procedures: n/a  Central access: PICC replaced 05/29/19 TPN start date: chronic, on PTA; resuming 5/11 as inpatient  Nutritional Goals   RD consulted  Current Nutrition:  Per [facility]:   TPN 3-in-1 1800 mL IV daily.    Base Solution:  --Amino Acid: 90 gm/day; 641mL/day of 15% amino acids  (1.7gm/kg/day)  --Dextrose: 180gm/day; 269mL/day of 70% dextrose    (GIR: 4.1mg /kg/min)  --Lipids:50 gm/day ; 222mL/day of 20% smoflipids  --Final %= AA: 5; Dextrose: 10; Lipids: 2.8    Provides 1472kcals/day (28kcal/kg/day)  --Electrolytes/bag:   Calcium Gluconate: 0 mEq    Magnesium Sulfate: 80mEq        Potassium Acetate:  12.12mEq    Potassium  Chloride: 0 mEq     Potassium Phosphate: 13.5 mmol        Sodium Acetate: 73mEq    Sodium Chloride: 85 mEq    Sodium Phosphate: 0 mmol  --Additives/bag:   Trace Elements: 68mL      --Patient additives/bag:   MVI 10 mL      Plan:  Continue TPN to mimic what patient has been receiving from [facility] with small adjustments: Cyclic TPN 14 h with 1 h ramp up and 1 h ramp down providing 1468 kcal w/ 90 g protein, ~ 50 g lipid/day, and 180 g dextrose Electrolytes in TPN: 1mEq/L of Na, 40 mEq/L of K, 0 mEq/L of Ca, 7.5 mEq/L of Mg, and 7.5 mmol/L of Phos. Cl:Ac ratio 1:1 Add standard MVI and trace elements to TPN Initiate Sensitive SSI with CBGs 0200, 0900, 1400, 2000 and adjust as needed  Reduce MIVF at 20 ml/hr at 1800 Monitor TPN labs on Mon/Thurs CMET, Mg, and phos in AM F/U rate changes  Ulice Dash D 07/22/2019,12:58 PM

## 2019-07-22 NOTE — ED Notes (Addendum)
Hypoglycemic Event  CBG: 69  Treatment: 1/2 amp D50  Symptoms: none  Follow-up CBG: OFVW:[id number] CBG Result:107  Possible Reasons for Event:poor appetite  Comments/MD notified:Kamineni made aware, will order D5 fluids    Prince Solian

## 2019-07-22 NOTE — Progress Notes (Signed)
PROGRESS NOTE    Gina [name]  LDJ:[id number]  DOB: 1961-01-25  PCP: [name], MD Admit date:07/21/2019 59 y.o. female with history h/o diabetes, hypertension, PAD,  bilateral BKA, osteomyelitis, schizophrenia, perforated peptic ulcer s/p [name] patch Nov 0300 complicated by [facility] fistula requiring IR drain, now on chronic TPNsince early 2021,  history of MRSA bacteremia in March 2021, chronic decubiti who was  recently admitted to this [facility] 4/23-4/27/2021 for dyspepsia/anemia, presented again today to ED with complaints of generalized weakness and after outpatient lab work revealed worsening hemoglobin level.  Patient received 2 units of PRBC in April, was seen by GI and underwent CT abdomen/pelvis for abdominal pain work-up but EGD was deferred by GI in previous admission. Patient was discharged on iron supplements and hemoglobin improved to 9 posttransfusion ED Course: WBC 6.4, hemoglobin 6.5, hematocrit 21, MCV 94, platelet 246, sodium 138, potassium 3.8, bicarb 24, BUN 41, creatinine 1.74,  2 units of PRBC transfusion ordered through ED  Hospital course: Patient admitted to [facility] for further evaluation with GI consultation.   Subjective:  Patient still roomed in ED , awaiting bed availability. Has had low BG levels overnight. Was ordered for NPO after MN but had CLD for breakfast anyway. Seen by GI and plan for bowel prep   Objective: Vitals:   07/22/19 1200 07/22/19 1221 07/22/19 1230 07/22/19 1313  BP: 139/66 139/66 127/71 135/67  Pulse: 65 64 68 66  Resp: 11 12 (!) 21 14  Temp:      TempSrc:      SpO2: 100% 100% 97% 99%  Weight:      Height:        Intake/Output Summary (Last 24 hours) at 07/22/2019 1327 Last data filed at 07/22/2019 1115 Gross per 24 hour  Intake 1147.28 ml  Output --  Net 1147.28 ml   Filed Weights   07/21/19 1248  Weight: 61 kg    Physical Examination:  General exam: Appears calm and comfortable today, no acute  distress Respiratory system: Clear to auscultation. Respiratory effort normal. Cardiovascular system: S1 & S2 heard, RRR. No JVD, murmurs, rubs, gallops or clicks. No pedal edema. Gastrointestinal system: Abdomen is nondistended, soft and nontender. Normal bowel sounds heard. Central nervous system: Alert and oriented. No new focal neurological deficits. Extremities: s/p bilateral BKA with dressing along stump wounds Skin: bilateral stump wounds and stage 2 coccygeal decubiti Psychiatry: Judgement and insight appear normal. Mood & affect appropriate.   Data Reviewed: I have personally reviewed following labs and imaging studies  CBC: Recent Labs  Lab 07/21/19 1320 07/22/19 0518  WBC 6.4 7.1  NEUTROABS 3.5 3.4  HGB 6.5* 9.2*  HCT 21.0* 28.5*  MCV 94.2 86.1  PLT 246 923   Basic Metabolic Panel: Recent Labs  Lab 07/21/19 1320 07/22/19 0518  NA 138 134*  K 3.8 3.7  CL 106 105  CO2 24 24  GLUCOSE 73 67*  BUN 41* 31*  CREATININE 0.74 0.62  CALCIUM 8.3* 8.0*  MG  --  1.6*  PHOS  --  3.4   GFR: Estimated Creatinine Clearance: 65.9 mL/min (by C-G formula based on SCr of 0.62 mg/dL). Liver Function Tests: Recent Labs  Lab 07/22/19 0518  AST 17  ALT 13  ALKPHOS 69  BILITOT 0.6  PROT 6.3*  ALBUMIN 2.0*   No results for input(s): LIPASE, AMYLASE in the last 168 hours. No results for input(s): AMMONIA in the last 168 hours. Coagulation Profile: No results for input(s): INR, PROTIME  in the last 168 hours. Cardiac Enzymes: No results for input(s): CKTOTAL, CKMB, CKMBINDEX, TROPONINI in the last 168 hours. BNP (last 3 results) No results for input(s): PROBNP in the last 8760 hours. HbA1C: No results for input(s): HGBA1C in the last 72 hours. CBG: Recent Labs  Lab 07/21/19 2333 07/22/19 0808 07/22/19 0936 07/22/19 1154  GLUCAP 74 69* 107* 75   Lipid Profile: Recent Labs    07/22/19 0519  TRIG 118   Thyroid Function Tests: No results for input(s): TSH,  T4TOTAL, FREET4, T3FREE, THYROIDAB in the last 72 hours. Anemia Panel: No results for input(s): VITAMINB12, FOLATE, FERRITIN, TIBC, IRON, RETICCTPCT in the last 72 hours. Sepsis Labs: No results for input(s): PROCALCITON, LATICACIDVEN in the last 168 hours.  Recent Results (from the past 240 hour(s))  SARS Coronavirus 2 by RT PCR (hospital order, performed in [facility] hospital lab) Nasopharyngeal Nasopharyngeal Swab     Status: None   Collection Time: 07/21/19  3:06 PM   Specimen: Nasopharyngeal Swab  Result Value Ref Range Status   SARS Coronavirus 2 NEGATIVE NEGATIVE Final    Comment: (NOTE) SARS-CoV-2 target nucleic acids are NOT DETECTED. The SARS-CoV-2 RNA is generally detectable in upper and lower respiratory specimens during the acute phase of infection. The lowest concentration of SARS-CoV-2 viral copies this assay can detect is 250 copies / mL. A negative result does not preclude SARS-CoV-2 infection and should not be used as the sole basis for treatment or other patient management decisions.  A negative result may occur with improper specimen collection / handling, submission of specimen other than nasopharyngeal swab, presence of viral mutation(s) within the areas targeted by this assay, and inadequate number of viral copies (<250 copies / mL). A negative result must be combined with clinical observations, patient history, and epidemiological information. Fact Sheet for Patients:   StrictlyIdeas.no Fact Sheet for Healthcare Providers: BankingDealers.co.za This test is not yet approved or cleared  by the [place] FDA and has been authorized for detection and/or diagnosis of SARS-CoV-2 by FDA under an Emergency Use Authorization (EUA).  This EUA will remain in effect (meaning this test can be used) for the duration of the COVID-19 declaration under Section 564(b)(1) of the Act, 21 U.S.C. section 360bbb-3(b)(1), unless  the authorization is terminated or revoked sooner. Performed at [facility], [place] [address] [place], [place] [address]       Radiology Studies: No results found.      Scheduled Meds: . amLODipine  5 mg Oral Daily  . ascorbic acid  500 mg Oral BID  . escitalopram  20 mg Oral Daily  . ferrous sulfate  300 mg Oral Q breakfast  . gabapentin  300 mg Oral BID  . magnesium oxide  400 mg Oral BID  . melatonin  3 mg Oral QHS  . metoprolol tartrate  50 mg Oral BID  . pantoprazole (PROTONIX) IV  40 mg Intravenous Q12H  . polyethylene glycol-electrolytes  4,000 mL Oral Once   Continuous Infusions: . sodium chloride Stopped (07/22/19 1109)  . dextrose 5 % and 0.9% NaCl Stopped (07/22/19 1030)  . TPN CYCLIC-ADULT (ION)       Assessment/Plan:  1.  Recurrent symptomatic iron deficiency anemia: Secondary to anemia of chronic disease versus chronic GI losses versus malnutrition.  MCV within normal limits but noted to have iron deficiency on lab work in previous admission.  Also with guaiac positive stools recently--although could be related to multiple chronic GI issues.  Given  recurrent anemia, consulted GI--seen by Dr Michail Sermon and plan for bowel prep followed by EGD/colonoscopy on 5/13. Transfused 2 units PRBC yesterday--Hgb improved to 9 now.  IV PPI. CLD  2.  History of perforated gastric ulcer/Graham patch: On PPI oral at home.  IV PPI given problem #1.  GI evaluation appreciated. IV PPI  3.  Diabetes mellitus:?  Diet controlled ?  Resolved.  Not on any insulin or oral hypoglycemics.  Last hemoglobin A1c in March and April less than 6.2.  (Was 15.2 in hospitalization 01/2019, also has h/o DKA in the past). Been somewhat hypoglycemic with BG 60-80 this morning, started on dextrose fluids. Hold off on SSI per d/w DE.Check cortisol level.  4.  Hypertension: Resume home medications  5.  History of complicated peritonitis/candidemia/exploratory laparotomy: S/p IR  abdominal drain removal in recent admission.   6.  Peripheral arterial disease/osteomyelitis/bilateral BKA, stump wounds: follows wound care/resume home medications.  7.  Pressure injury, bilateral coccyx stage II: wound care consulted-patient known to them from recent hospitalization. Pain control  8.  Severe protein calorie malnutrition: On TPN  DVT prophylaxis: S/P bilateral BKA. Anemia precludes anticoagulants Code Status: DNR Family / Patient Communication: Tried calling at the listed number, no answer. Patient stated she will update daughter Disposition Plan:   Status is: Inpatient  Remains inpatient appropriate because:Ongoing diagnostic testing needed not appropriate for outpatient work up   Dispo: The patient is from: Home              Anticipated d/c is to: Home              Anticipated d/c date is: 2 days              Patient currently is not medically stable to d/c.           LOS: 0 days    Time spent:     Guilford Shi, MD [facility] Pager in [place]  If 7PM-7AM, please contact night-coverage [email or web address] 07/22/2019, 1:27 PM

## 2019-07-22 NOTE — ED Notes (Signed)
Dr. Earnest Conroy made aware of CBG 75, no new order, patient currently eating her clear liquid diet.

## 2019-07-22 NOTE — Progress Notes (Signed)
PHARMACY - TOTAL PARENTERAL NUTRITION CONSULT NOTE   Indication: Fistula  Patient Measurements: Height: 5\' 2"  (157.5 cm) Weight: 61 kg (134 lb 7.7 oz) IBW/kg (Calculated) : 50.1 TPN AdjBW (KG): 61 Body mass index is 24.6 kg/m. Usual Weight: fluctuates between 55 and 61 kg since Oct 2020  Assessment: 59 yo female hx of perforated peptic ulcer s/p [name] patch Nov 1540 complicated by [facility] fistula s/p IR drainage now on chronic TPN since early 2021. PMH includes DM2, HTN, osteomyelitis with bilateral BKA's, schizophrenia, recent admissions for fungal peritonitis and MRSA bacteremia requiring PICC replacement. Admitted for the second time in 30 days with anemia and weakness. Pharmacy consulted to continue managing home TPN.  Glucose / Insulin:  Previously moderately well controlled (ranging from 100s to low 200s, no lows, mild hypoglycemia this admission requiring D50, D5NS started Electrolytes: WNL except Mg 1.6, Na 134 Renal: SCr, bicarb WNL; BUN elevated c/w occult GIB (had +FOBT last admission; not ordered yet this admission) LFTs / TGs: WNL Prealbumin / albumin: 8.8 (5/11)/2 (5/11) Intake / Output; MIVF: abdominal drains removed during most recent admission, although still charted as in place in Epic GI Imaging: possible EGD Surgeries / Procedures: n/a  Central access: PICC replaced 05/29/19 TPN start date: chronic, on PTA; resuming 5/11 as inpatient  Nutritional Goals   RD consulted  Current Nutrition:  Per [facility]:   TPN 3-in-1 1800 mL IV daily.    Base Solution:  --Amino Acid: 90 gm/day; 640mL/day of 15% amino acids  (1.7gm/kg/day)  --Dextrose: 180gm/day; 236mL/day of 70% dextrose    (GIR: 4.1mg /kg/min)  --Lipids:50 gm/day ; 210mL/day of 20% smoflipids  --Final %= AA: 5; Dextrose: 10; Lipids: 2.8    Provides 1472kcals/day (28kcal/kg/day)  --Electrolytes/bag:   Calcium Gluconate: 0 mEq    Magnesium Sulfate: 20mEq        Potassium Acetate:  12.49mEq    Potassium Chloride: 0  mEq     Potassium Phosphate: 13.5 mmol        Sodium Acetate: 34mEq    Sodium Chloride: 85 mEq    Sodium Phosphate: 0 mmol  --Additives/bag:   Trace Elements: 60mL      --Patient additives/bag:   MVI 10 mL      Plan:  Continue TPN to mimic what patient has been receiving from [facility] with small adjustments: Cyclic TPN 14 h with 1 h ramp up and 1 h ramp down providing 1468 kcal w/ 90 g protein, ~ 50 g lipid/day, and 180 g dextrose Electrolytes in TPN: 73mEq/L of Na, 40 mEq/L of K, 0 mEq/L of Ca, 7.5 mEq/L of Mg, and 7.5 mmol/L of Phos. Cl:Ac ratio 1:1 Add standard MVI and trace elements to TPN Initiate Sensitive SSI with CBGs 0200, 0900, 1400, 2000 and adjust as needed  Reduce MIVF at 20 ml/hr at 1800 Monitor TPN labs on Mon/Thurs CMET, Mg, and phos in AM F/U rate changes  Ulice Dash D 07/22/2019,11:36 AM

## 2019-07-22 NOTE — Consult Note (Signed)
Referring Provider: Dr. Earnest Conroy Primary Care Physician:  [name], MD Primary Gastroenterologist:  Althia Forts  Reason for Consultation:  Anemia  HPI: Gina [name] is a 59 y.o. female with history of a perforated pyloric channel ulcer oversewn with a [name] patch in November 5956 complicated by a chronic enterocutaneous fistula with a pouch/ostomy bag around the fistula. She has been feeling weak and nauseous but denies melena, hematochezia, or vomiting. Chronic abdominal pain and chronic loose stools into pouch and from rectum. She had a percutaneous drain for this enterocutaneous fistula as well that was removed last month while hospitalized. Hgb 6.6 last month and 9.1 at d/c. Hgb on presentation yesterday 6.5 and transfused to 9.2. Bilateral BKA. Denies alcohol. Not on NSAIDs. No history of previous EGD/colonoscopy.     Past Medical History:  Diagnosis Date  . Diabetes mellitus   . Hypertension   . Osteomyelitis of right foot ([place]) 02/22/2017  . Perforated gastric ulcer ([place])   . S/P bilateral BKA (below knee amputation) ([place])   . Schizophrenia ([place])   . Schizophrenia ([place])   . Septic arthritis of interphalangeal joint of toe of left foot ([place]) 06/02/2016  . Stress incontinence 04/26/2017    Past Surgical History:  Procedure Laterality Date  . AMPUTATION Left 06/05/2016   Procedure: LEFT FOOT TRANSMETATARSAL AMPUTATION;  Surgeon: [name], MD;  Location: [facility];  Service: Orthopedics;  Laterality: Left;  . AMPUTATION Right 11/11/2017   Procedure: AMPUTATION BELOW KNEE;  Surgeon: [name], MD;  Location: [place];  Service: Orthopedics;  Laterality: Right;  . AMPUTATION Left 05/10/2018   Procedure: LEFT BELOW KNEE AMPUTATION;  Surgeon: [name], MD;  Location: [place];  Service: Orthopedics;  Laterality: Left;  . AMPUTATION Left 12/28/2018   Procedure: AMPUTATION THIRD DIGIT;  Surgeon: Iran Planas, MD;  Location: [facility];  Service: Orthopedics;  Laterality: Left;   . AMPUTATION TOE Right 02/23/2017   Procedure: AMPUTATION RIGHT THIRD TOE;  Surgeon: [name], MD;  Location: [facility];  Service: Orthopedics;  Laterality: Right;  . I & D EXTREMITY Right 11/09/2017   Procedure: Debride Ulcer Right Heel;  Surgeon: [name], MD;  Location: [place];  Service: Orthopedics;  Laterality: Right;  . I & D EXTREMITY Left 12/28/2018   Procedure: IRRIGATION AND DEBRIDEMENT EXTREMITY;  Surgeon: Iran Planas, MD;  Location: [facility];  Service: Orthopedics;  Laterality: Left;  . INCISION AND DRAINAGE OF WOUND Left 12/26/2018   Procedure: IRRIGATION AND DEBRIDEMENT LEFT HAND;  Surgeon: Iran Planas, MD;  Location: [facility];  Service: Orthopedics;  Laterality: Left;  . LAPAROTOMY N/A 02/02/2019   Procedure: EXPLORATORY LAPAROTOMY WITH OVERSEW OF DUODENAL ULCER;  Surgeon: Alphonsa Overall, MD;  Location: [facility];  Service: General;  Laterality: N/A;  . TEE WITHOUT CARDIOVERSION N/A 05/27/2019   Procedure: TRANSESOPHAGEAL ECHOCARDIOGRAM (TEE);  Surgeon: Skeet Latch, MD;  Location: [place];  Service: Cardiovascular;  Laterality: N/A;  . TUBAL LIGATION      Prior to Admission medications   Medication Sig Start Date End Date Taking? Authorizing Provider  amLODipine (NORVASC) 5 MG tablet Take 5 mg by mouth daily.   Yes [provider]  ascorbic acid (VITAMIN C) 500 MG tablet Take 1 tablet (500 mg total) by mouth 2 (two) times daily. 06/10/19  Yes Shelly Coss, MD  escitalopram (LEXAPRO) 20 MG tablet Take 20 mg by mouth daily.   Yes [provider]  gabapentin (NEURONTIN) 300 MG capsule Take 1 capsule (  300 mg total) by mouth 2 (two) times daily. 06/10/19  Yes Shelly Coss, MD  loperamide (IMODIUM) 2 MG capsule Take 1 capsule (2 mg total) by mouth every 8 (eight) hours as needed for diarrhea or loose stools. 07/07/19  Yes Regalado, Belkys A, MD  melatonin 3 MG TABS tablet Take 1 tablet (3 mg total) by mouth at bedtime. 06/10/19  Yes Shelly Coss, MD   metoprolol tartrate (LOPRESSOR) 50 MG tablet Take 1 tablet (50 mg total) by mouth 2 (two) times daily. 06/10/19  Yes Shelly Coss, MD  Nutritional Supplements (ENSURE ENLIVE PO) Take 237 mLs by mouth 2 (two) times daily with a meal.   Yes [provider]  ondansetron (ZOFRAN) 4 MG tablet Take 1 tablet (4 mg total) by mouth every 6 (six) hours as needed for nausea. 06/10/19  Yes Shelly Coss, MD  oxyCODONE (OXY IR/ROXICODONE) 5 MG immediate release tablet Take 1 tablet (5 mg total) by mouth every 6 (six) hours as needed (or pain). 06/10/19  Yes Shelly Coss, MD  pantoprazole (PROTONIX) 40 MG tablet Take 1 tablet (40 mg total) by mouth daily. 06/10/19  Yes Shelly Coss, MD  simethicone (MYLICON) 80 MG chewable tablet Chew 80 mg by mouth 4 (four) times daily as needed (indigestion).    Yes [provider]  amLODipine (NORVASC) 5 MG tablet Take 1 tablet (5 mg total) by mouth daily. 06/10/19 07/10/19  Shelly Coss, MD  ferrous sulfate 300 (60 Fe) MG/5ML syrup Take 5 mLs (300 mg total) by mouth daily with breakfast. 07/07/19   Regalado, Belkys A, MD  Magnesium 400 MG CAPS Take 400 mg by mouth 2 (two) times daily. 07/07/19   Regalado, Belkys A, MD  nystatin (MYCOSTATIN) 100000 UNIT/ML suspension Take 5 mLs (500,000 Units total) by mouth 4 (four) times daily. Patient not taking: Reported on 07/21/2019 07/07/19   Regalado, Jerald Kief A, MD  sodium chloride (OCEAN) 0.65 % SOLN nasal spray Place 1 spray into both nostrils every 4 (four) hours as needed (dry nasal membranes).    [provider]    Scheduled Meds: . amLODipine  5 mg Oral Daily  . ascorbic acid  500 mg Oral BID  . escitalopram  20 mg Oral Daily  . ferrous sulfate  300 mg Oral Q breakfast  . gabapentin  300 mg Oral BID  . insulin aspart  0-9 Units Subcutaneous TID WC  . Magnesium  400 mg Oral BID  . melatonin  3 mg Oral QHS  . metoprolol tartrate  50 mg Oral BID  . pantoprazole (PROTONIX) IV  40 mg Intravenous  Q12H   Continuous Infusions: . sodium chloride    . magnesium sulfate bolus IVPB     PRN Meds:.acetaminophen **OR** acetaminophen, loperamide, morphine injection, ondansetron, oxyCODONE, simethicone  Allergies as of 07/21/2019 - Review Complete 07/21/2019  Allergen Reaction Noted  . Bee venom Swelling 12/26/2018  . Metformin and related Other (See Comments) 06/18/2013    Family History  Problem Relation Age of Onset  . Diabetes type II Father   . CAD Father   . Prostate cancer Father   . Diabetes Mellitus II Brother     Social History   Socioeconomic History  . Marital status: Single    Spouse name: Not on file  . Number of children: Not on file  . Years of education: Not on file  . Highest education level: Not on file  Occupational History  . Not on file  Tobacco Use  . Smoking  status: Never Smoker  . Smokeless tobacco: Never Used  Substance and Sexual Activity  . Alcohol use: No    Comment: occasionally  . Drug use: No  . Sexual activity: Not Currently  Other Topics Concern  . Not on file  Social History Narrative  . Not on file   Social Determinants of Health   Financial Resource Strain:   . Difficulty of Paying Living Expenses:   Food Insecurity:   . Worried About Charity fundraiser in the Last Year:   . Arboriculturist in the Last Year:   Transportation Needs:   . Film/video editor (Medical):   [name] Lack of Transportation (Non-Medical):   Physical Activity:   . Days of Exercise per Week:   . Minutes of Exercise per Session:   Stress:   . Feeling of Stress :   Social Connections:   . Frequency of Communication with Friends and Family:   . Frequency of Social Gatherings with Friends and Family:   . Attends Religious Services:   . Active Member of Clubs or Organizations:   . Attends Archivist Meetings:   [name] Marital Status:   Intimate Partner Violence:   . Fear of Current or Ex-Partner:   . Emotionally Abused:   [name] Physically Abused:    . Sexually Abused:     Review of Systems: All negative except as stated above in HPI.  Physical Exam: Vital signs: Vitals:   07/22/19 0800 07/22/19 0851  BP: (!) 144/89 (!) 150/73  Pulse: 66 66  Resp: 15 13  Temp:    SpO2: 97% 99%  T 98.6   General:   Lethargic, cachetic, frail, no acute distress  Head: normocephalic, atraumatic Eyes: anicteric sclera ENT: oropharynx clear, poor dentition Neck: supple, nontender Lungs:  Clear throughout to auscultation.   No wheezes, crackles, or rhonchi. No acute distress. Heart:  Regular rate and rhythm; no murmurs, clicks, rubs,  or gallops. Abdomen: diffuse tenderness with guarding, soft, nondistended, ostomy pouch in place with loose brown stool noted  Rectal:  Deferred Ext: bilateral BKA  GI:  Lab Results: Recent Labs    07/21/19 1320 07/22/19 0518  WBC 6.4 7.1  HGB 6.5* 9.2*  HCT 21.0* 28.5*  PLT 246 260   BMET Recent Labs    07/21/19 1320 07/22/19 0518  NA 138 134*  K 3.8 3.7  CL 106 105  CO2 24 24  GLUCOSE 73 67*  BUN 41* 31*  CREATININE 0.74 0.62  CALCIUM 8.3* 8.0*   LFT Recent Labs    07/22/19 0518  PROT 6.3*  ALBUMIN 2.0*  AST 17  ALT 13  ALKPHOS 69  BILITOT 0.6   PT/INR No results for input(s): LABPROT, INR in the last 72 hours.   Studies/Results: No results found.  Impression/Plan: 59 yo with recurrent anemia and heme positive last month with history of complicated perforated duodenal ulcer that was patched but developed enterocutaneous fistula. No overt bleeding but due to recurrent severe anemia needs inpt GI evaluation with EGD/colonoscopy to further evaluate. Colon prep today and tomorrow and will do procedures on 07/24/19. Clear liquid diet. Supportive care.    LOS: 0 days   Lear Ng  07/22/2019, 9:35 AM  Questions please call [phone]

## 2019-07-22 NOTE — Consult Note (Addendum)
[place] Nurse Consult Note: Pt is familiar to [facility] team from several previous admissions.  She has a chronic abd fistula and her daughter changes the pouch at home every 2-3 days. Current pouch is leaking large amt brown drainage.  Removed pouch and applied new one.   Full thickness wound with fistua to midline abd; .3X.3cm with intact skin surrounding.  3 extra small Eakin pouches left in the room and instructions provided for staff nurses to perform PRN if leaking occurs as follows:  Cut Eakin to fit; use pattern in room.  [address] [place] File # [phone]) Cleanse fistula area with soap and water and pat dry.  Apply Eakin and press around edges.  May tape perimeter per patient preference.  Attach to bedside drainage bag.    Reason for Consult: Sacrum with chronic stage 3 pressure injury; 4X1.5X.3cm, 100% red and moist, mod amt tan drainage, no odor Left posterior stump with chronic healing  stage 3 pressure injury; 2X3X.2cm, 100% red and moist, mod amt tan drainage, no odor Right posterior stump with chronic healing stage 3 pressure injury; 1X1X.1cm, 100% red and moist, mod amt tan drainage, no odor Bilat stumps have pink dry scar tissue on the sides from previous wounds which have healed.  Pressure Injury POA: Yes Dressing procedure/placement/frequency: Pt was using a Blue foam dressing to left stump prior to admission.  Informed her we do not have this product in the [place] and will substitute Alginate; she verbalized understanding. Pt was also using Ace wraps to BLE prior to admission.  Topical treatment orders provided for staff nurses to perform 3 times a week to absorb drainage and promote healing as follows: Change dressing to sacrum and bilat stumps Q Tues/Thurs/Sat as follows:  Moisten with NS to remove previous dressing, then apply a piece of alginate Kellie Simmering # [phone]) and cover with foam dressing.  (Change foam dressings Q 3 days or PRN soiling.)  Cover bilat stumps with ace wrap to  hold dressings in place.  Please re-consult if further assistance is needed.  Thank-you,  Julien Girt MSN, [place], [place], [place], [place]

## 2019-07-23 DIAGNOSIS — Z89519 Acquired absence of unspecified leg below knee: Secondary | ICD-10-CM

## 2019-07-23 DIAGNOSIS — D649 Anemia, unspecified: Secondary | ICD-10-CM

## 2019-07-23 DIAGNOSIS — E43 Unspecified severe protein-calorie malnutrition: Secondary | ICD-10-CM

## 2019-07-23 DIAGNOSIS — I10 Essential (primary) hypertension: Secondary | ICD-10-CM

## 2019-07-23 DIAGNOSIS — E1100 Type 2 diabetes mellitus with hyperosmolarity without nonketotic hyperglycemic-hyperosmolar coma (NKHHC): Secondary | ICD-10-CM

## 2019-07-23 LAB — COMPREHENSIVE METABOLIC PANEL
ALT: 12 U/L (ref 0–44)
AST: 14 U/L — ABNORMAL LOW (ref 15–41)
Albumin: 1.9 g/dL — ABNORMAL LOW (ref 3.5–5.0)
Alkaline Phosphatase: 51 U/L (ref 38–126)
Anion gap: 2 — ABNORMAL LOW (ref 5–15)
BUN: 32 mg/dL — ABNORMAL HIGH (ref 6–20)
CO2: 25 mmol/L (ref 22–32)
Calcium: 7.9 mg/dL — ABNORMAL LOW (ref 8.9–10.3)
Chloride: 107 mmol/L (ref 98–111)
Creatinine, Ser: 0.63 mg/dL (ref 0.44–1.00)
GFR calc Af Amer: >60 mL/min (ref >60)
GFR calc non Af Amer: >60 mL/min (ref >60)
Glucose, Bld: 227 mg/dL — ABNORMAL HIGH (ref 70–99)
Potassium: 5 mmol/L (ref 3.5–5.1)
Sodium: 134 mmol/L — ABNORMAL LOW (ref 135–145)
Total Bilirubin: 0.3 mg/dL (ref 0.3–1.2)
Total Protein: 6 g/dL — ABNORMAL LOW (ref 6.5–8.1)

## 2019-07-23 LAB — GLUCOSE, CAPILLARY
Glucose-Capillary: 167 mg/dL — ABNORMAL HIGH (ref 70–99)
Glucose-Capillary: 226 mg/dL — ABNORMAL HIGH (ref 70–99)
Glucose-Capillary: 151 mg/dL — ABNORMAL HIGH (ref 70–99)
Glucose-Capillary: 69 mg/dL — ABNORMAL LOW (ref 70–99)
Glucose-Capillary: 72 mg/dL (ref 70–99)
Glucose-Capillary: 172 mg/dL — ABNORMAL HIGH (ref 70–99)

## 2019-07-23 LAB — PHOSPHORUS: Phosphorus: 3.2 mg/dL (ref 2.5–4.6)

## 2019-07-23 LAB — MAGNESIUM: Magnesium: 2 mg/dL (ref 1.7–2.4)

## 2019-07-23 MED ORDER — TRAVASOL 10 % IV SOLN
INTRAVENOUS | Status: AC
  Filled 2019-07-23: qty 900

## 2019-07-23 MED ORDER — INSULIN ASPART 100 UNIT/ML ~~LOC~~ SOLN
0.0000 [IU] | SUBCUTANEOUS | Status: DC
  Administered 2019-07-23 – 2019-07-25 (×6): 2 [IU] via SUBCUTANEOUS
  Administered 2019-07-25: 3 [IU] via SUBCUTANEOUS

## 2019-07-23 MED ORDER — BOOST / RESOURCE BREEZE PO LIQD CUSTOM
1.0000 | Freq: Two times a day (BID) | ORAL | Status: DC
  Administered 2019-07-23 – 2019-07-25 (×3): 1 via ORAL

## 2019-07-23 NOTE — Progress Notes (Signed)
PHARMACY - TOTAL PARENTERAL NUTRITION CONSULT NOTE   Indication: Fistula  Patient Measurements: Height: 5\' 2"  (157.5 cm) Weight: 61 kg (134 lb 7.7 oz) IBW/kg (Calculated) : 50.1 TPN AdjBW (KG): 61 Body mass index is 24.6 kg/m. Usual Weight: fluctuates between 55 and 61 kg since Oct 2020  Assessment: 59 yo female hx of perforated peptic ulcer s/p [name] patch Nov 6270 complicated by [facility] fistula s/p IR drainage now on chronic TPN since early 2021. PMH includes DM2, HTN, osteomyelitis with bilateral BKA's, schizophrenia, recent admissions for fungal peritonitis and MRSA bacteremia requiring PICC replacement. Admitted for the second time in 30 days with anemia and weakness. Pharmacy consulted to continue managing home TPN.  Glucose / Insulin:  Previously moderately well controlled (ranging from 100s to low 200s, no lows) 5/11 mild hypoglycemia requiring D50x1, D5NS started 5/11 PM 5/12 CBGs up to 350 during cyclic TPN, CBGs 09-381, on D5NS at 50 ml/hr SSI dc'd by MD on 5/11, will resume now Electrolytes: K 5, Na 134, CoCa WNL, Mg up to 2, phos 3.2 Renal: SCr, bicarb WNL; BUN elevated c/w occult GIB (had +FOBT last admission; not ordered yet this admission) LFTs / TGs: WNL Prealbumin / albumin: 8.8 (5/11)/2 (5/11) Intake / Output; MIVF: abdominal drains removed during most recent admission, although still charted as in place in Epic 5/11 + 2030, 1500 mls out/24 hrs D5NS at 50 ml/hr - d/w TRH, will leave today and f/u SSI GI Imaging:  EGD/colonoscopy planned for 5/13 Surgeries / Procedures: n/a Central access: PICC replaced 05/29/19 TPN start date: chronic, on PTA; resuming 5/11 as inpatient Nutritional Goals   RD consulted Current Nutrition: TPN & CLD Per [facility]:  TPN 3-in-1 1800 mL IV daily.    Base Solution: --Amino Acid: 90 gm/day; 636mL/day of 15% amino acids  (1.7gm/kg/day) --Dextrose: 180gm/day; 281mL/day of 70% dextrose    (GIR: 4.1mg /kg/min) --Lipids:50 gm/day ; 221mL/day of 20%  smoflipids --Final %= AA: 5; Dextrose: 10; Lipids: 2.8     Provides 1472kcals/day(28kcal/kg/day) --Electrolytes/bag:   Calcium Gluconate: 0 mEq    Magnesium Sulfate: 33mEq       Potassium Acetate:  12.67mEq    Potassium Chloride: 0 mEq     Potassium Phosphate: 13.5 mmol       Sodium Acetate: 87mEq    Sodium Chloride: 85 mEq    Sodium Phosphate: 0 mmol --Additives/bag:   Trace Elements: 53mL    --Patient additives/bag:   MVI 10 mL      Plan:  Continue TPN to mimic what patient has been receiving from [facility] with small adjustments: Cyclic TPN 14 h with 1 h ramp up and 1 h ramp down providing 1468 kcal w/ 90 g protein, ~ 50 g lipid/day, and 180 g dextrose Electrolytes in TPN: 100 mEq/L of Na (increase), 35 mEq/L of K (decrease) , 0 mEq/L of Ca, 7.5 mEq/L of Mg, and 7.5 mmol/L of Phos. Cl:Ac ratio 1:1 Add standard MVI and trace elements to TPN resume Sensitive SSI with CBGs 0200, 0900, 1400, 2000 and adjust as needed  MIVF D5NS at 50 ml/hr per [facility] Monitor TPN labs on Mon/Thurs F/U rate changes  Eudelia Bunch, Pharm.D [phone] 07/23/2019 9:19 AM

## 2019-07-23 NOTE — Progress Notes (Signed)
Initial Nutrition Assessment  DOCUMENTATION CODES:   Non-severe (moderate) malnutrition in context of chronic illness  INTERVENTION:   -Cyclic TPN per Pharmacy -Boost Breeze po TID, each supplement provides 250 kcal and 9 grams of protein  NUTRITION DIAGNOSIS:   Moderate Malnutrition related to chronic illness, wound healing(chronic abdominal fistula) as evidenced by mild fat depletion, mild muscle depletion.  GOAL:   Patient will meet greater than or equal to 90% of their needs  MONITOR:   PO intake, Labs, Weight trends, I & O's, Skin(TPN)  REASON FOR ASSESSMENT:   Consult New TPN/TNA  ASSESSMENT:   59 y.o. female with history h/o  diabetes, hypertension, PAD,  bilateral BKA, osteomyelitis, schizophrenia, perforated peptic ulcer s/p [name] patch Nov 6644 complicated by [facility] fistula requiring IR drain, now on chronic TPN since early 2021,  history of MRSA bacteremia in March 2021, chronic decubiti who was  recently admitted to this [facility] 4/23-4/27/2021 for dyspepsia/anemia, presented again today to ED with complaints of generalized weakness and after outpatient lab work revealed worsening hemoglobin level.  Patient reports consuming solid foods at home along with Ensure/Boost drinks. Pt states her children won't allow her to have certain foods such as canned fruits and cookies as they have "too much sugar". Pt interested in having information about carbohydrate foods and diabetes. Will provide information at follow-up. Pt is interested in receiving Boost Breeze while on clear liquids. Pt denies any issues with chewing or swallowing.  Pt scheduled for EGD/colonoscopy 5/13 per GI note.  Pt is on chronic home TPN. Cyclic TPN has been initiated via Pharmacy.   No new weight has been measured for this admission. Pt states she hasn't been weighed this admission and last time she was weighed was at nursing home when she weighed ~180 lbs. It may be difficult to weigh pt with  bilateral BKAs.   Medications: Vitamin C tablet, Ferrous sulfate syrup, MAG-OX tablet, D5 infusion Labs reviewed: CBGs: 151-226 Low Na Mg/Phos WNL  NUTRITION - FOCUSED PHYSICAL EXAM:    Most Recent Value  Orbital Region  No depletion  Upper Arm Region  Mild depletion  Thoracic and Lumbar Region  No depletion  Buccal Region  Mild depletion  Temple Region  Mild depletion  Clavicle Bone Region  Mild depletion  Clavicle and Acromion Bone Region  Mild depletion  Scapular Bone Region  Mild depletion  Dorsal Hand  Mild depletion  Patellar Region  Unable to assess  Anterior Thigh Region  Unable to assess  Posterior Calf Region  Unable to assess  Edema (RD Assessment)  Unable to assess  Hair  Reviewed  Eyes  Reviewed  Mouth  Other (Comment) [missing teeth]  Skin  Reviewed  Nails  Reviewed       Diet Order:   Diet Order            Diet NPO time specified  Diet effective midnight        Diet clear liquid Room service appropriate? Yes; Fluid consistency: Thin  Diet effective now              EDUCATION NEEDS:   Education needs have been addressed  Skin:  Skin Assessment: Skin Integrity Issues: Skin Integrity Issues:: Stage III Stage III: sacrum, bilateral posterior stumps  Last BM:  5/12 -type 7  Height:   Ht Readings from Last 1 Encounters:  07/21/19 5\' 2"  (1.575 m)    Weight:   Wt Readings from Last 1 Encounters:  07/21/19 61 kg  Ideal Body Weight:  44.5 kg(adjusted for bilateral BKAs)  BMI:  Body mass index is 24.6 kg/m.  Estimated Nutritional Needs:   Kcal:  1800-2000  Protein:  90-105g  Fluid:  2L/day  Clayton Bibles, MS, RD, LDN Inpatient Clinical Dietitian Contact information available via Amion

## 2019-07-23 NOTE — Progress Notes (Signed)
07/23/2019  1858  Offered patient prep for procedure schedule for tomorrow 07/24/2019  Multiple times through out the day. Each time patient refused to drink prep. I told patient and patient is aware that if she does not drink the prep and get cleaned out enough the doctors may cancel her procedure for 07/24/2019.
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
## 2019-07-23 NOTE — Progress Notes (Signed)
[facility]  PROGRESS NOTE  Gina [name] IDP:[id number] DOB: Apr 18, 1960 DOA: 07/21/2019 PCP: [name], MD   Brief HPI:   60 year old female with a history of diabetes mellitus type 2, hypertension, PAD, bilateral BKA, osteomyelitis, schizophrenia, perforated peptic ulcer status post [name] patch in November 4431 complicated by enterocutaneous fistula requiring IR drain, on chronic TPN since early 2021, history of MRSA bacteremia in March 2021, was recently admitted to hospital from 07/04/2019 to 07/08/2019 for dyspepsia/anemia.  Patient again presented today with complaints of generalized weakness after outpatient lab work revealed worsening hemoglobin 6.6.  She received 2 units PRBC in April was seen by GI and underwent CT abdomen/pelvis for abdominal pain work-up but EGD was deferred by GI in previous admission.  She was discharged home on iron supplements and hemoglobin improved to 9 posttransfusion.     Subjective   Patient seen and examined, denies any complaints.  Plan for EGD and colonoscopy in a.m.   Assessment/Plan:     1. Symptomatic anemia-secondary to GI bleed.  GI has been consulted.  Continue IV Protonix.  Patient is n.p.o.  Plan for EGD and colonoscopy on 07/24/2019. 2. History of perforated gastric ulcer-status post [name] patch.  Patient is on PPI at home.  Continue IV Protonix.  GI following. 3. Diabetes mellitus type 2-patient is not on insulin or oral hypoglycemics at home.  Last A1c in March and April was less than 6.2.  Patient was hypoglycemic yesterday no started on D5 normal saline.  We will continue with sliding scale insulin NovoLog as blood sugar has improved. 4. Hypertension-blood pressure is stable continue home medications including amlodipine, metoprolol. 5. History of complicated peritonitis/candidemia/exploratory laparotomy-s/p IR abdominal drain removal in recent admission. 6. Peripheral arterial disease/osteomyelitis/bilateral BKA/stump  wounds-wound care consulted. 7. Pressure injury, bilateral coccyx stage II ulcer-wound care consulted. 8. Severe protein calorie malnutrition-continue TPN    SpO2: 100 %   COVID-19 Labs  No results for input(s): DDIMER, FERRITIN, LDH, CRP in the last 72 hours.  Lab Results  Component Value Date   SARSCOV2NAA NEGATIVE 07/21/2019   [place] NEGATIVE 07/04/2019   [place] NEGATIVE 05/22/2019   [place] NEGATIVE 05/21/2019     CBG: Recent Labs  Lab 07/22/19 1729 07/22/19 2123 07/23/19 0203 07/23/19 0738 07/23/19 1110  GLUCAP 78 145* 167* 226* 151*    CBC: Recent Labs  Lab 07/21/19 1320 07/22/19 0518  WBC 6.4 7.1  NEUTROABS 3.5 3.4  HGB 6.5* 9.2*  HCT 21.0* 28.5*  MCV 94.2 86.1  PLT 246 540    Basic Metabolic Panel: Recent Labs  Lab 07/21/19 1320 07/22/19 0518 07/23/19 0325  NA 138 134* 134*  K 3.8 3.7 5.0  CL 106 105 107  CO2 24 24 25   GLUCOSE 73 67* 227*  BUN 41* 31* 32*  CREATININE 0.74 0.62 0.63  CALCIUM 8.3* 8.0* 7.9*  MG  --  1.6* 2.0  PHOS  --  3.4 3.2     Liver Function Tests: Recent Labs  Lab 07/22/19 0518 07/23/19 0325  AST 17 14*  ALT 13 12  ALKPHOS 69 51  BILITOT 0.6 0.3  PROT 6.3* 6.0*  ALBUMIN 2.0* 1.9*        DVT prophylaxis: SCDs, no anticoagulants due to GI bleed.  Code Status: DNR  Family Communication: No family at bedside  Disposition Plan:   Status is: Inpatient  Dispo: The patient is from: Home              Anticipated d/c is  to: Home              Anticipated d/c date is: 07/25/2019              Patient currently admitted for anemia due to GI bleed, plan for EGD and colonoscopy in a.m.  Pressure Injury 05/22/19 Coccyx Bilateral Stage 2 -  Partial thickness loss of dermis presenting as a shallow open injury with a red, pink wound bed without slough. (Active)  05/22/19 2104  Location: Coccyx  Location Orientation: Bilateral  Staging: Stage 2 -  Partial thickness loss of dermis presenting as a  shallow open injury with a red, pink wound bed without slough.  Wound Description (Comments):   Present on Admission: Yes     Pressure Injury 05/22/19 Knee Right;Distal Unstageable - Full thickness tissue loss in which the base of the injury is covered by slough (yellow, tan, gray, green or brown) and/or eschar (tan, brown or black) in the wound bed. Stump (Active)  05/22/19 2106  Location: Knee  Location Orientation: Right;Distal  Staging: Unstageable - Full thickness tissue loss in which the base of the injury is covered by slough (yellow, tan, gray, green or brown) and/or eschar (tan, brown or black) in the wound bed.  Wound Description (Comments): Stump  Present on Admission: Yes     Pressure Injury 05/22/19 Knee Left;Distal Unstageable - Full thickness tissue loss in which the base of the injury is covered by slough (yellow, tan, gray, green or brown) and/or eschar (tan, brown or black) in the wound bed. Stump (Active)  05/22/19 2108  Location: Knee  Location Orientation: Left;Distal  Staging: Unstageable - Full thickness tissue loss in which the base of the injury is covered by slough (yellow, tan, gray, green or brown) and/or eschar (tan, brown or black) in the wound bed.  Wound Description (Comments): Stump  Present on Admission: Yes     Pressure Injury 05/22/19 Thigh Left;Mid Stage 2 -  Partial thickness loss of dermis presenting as a shallow open injury with a red, pink wound bed without slough. (Active)  05/22/19 2109  Location: Thigh  Location Orientation: Left;Mid  Staging: Stage 2 -  Partial thickness loss of dermis presenting as a shallow open injury with a red, pink wound bed without slough.  Wound Description (Comments):   Present on Admission: Yes     Pressure Injury 07/22/19 Sacrum Mid Stage 3 -  Full thickness tissue loss. Subcutaneous fat may be visible but bone, tendon or muscle are NOT exposed. (Active)  07/22/19   Location: Sacrum  Location Orientation: Mid   Staging: Stage 3 -  Full thickness tissue loss. Subcutaneous fat may be visible but bone, tendon or muscle are NOT exposed.  Wound Description (Comments):   Present on Admission: Yes     Pressure Injury 07/22/19 Leg Left Stage 3 -  Full thickness tissue loss. Subcutaneous fat may be visible but bone, tendon or muscle are NOT exposed. (Active)  07/22/19   Location: Leg  Location Orientation: Left  Staging: Stage 3 -  Full thickness tissue loss. Subcutaneous fat may be visible but bone, tendon or muscle are NOT exposed.  Wound Description (Comments):   Present on Admission: Yes     Pressure Injury 07/22/19 Leg Right Stage 3 -  Full thickness tissue loss. Subcutaneous fat may be visible but bone, tendon or muscle are NOT exposed. (Active)  07/22/19   Location: Leg  Location Orientation: Right  Staging: Stage 3 -  Full thickness tissue  loss. Subcutaneous fat may be visible but bone, tendon or muscle are NOT exposed.  Wound Description (Comments):   Present on Admission: Yes       Scheduled medications:  . amLODipine  5 mg Oral Daily  . ascorbic acid  500 mg Oral BID  . Chlorhexidine Gluconate Cloth  6 each Topical Daily  . escitalopram  20 mg Oral Daily  . feeding supplement  1 Container Oral BID BM  . ferrous sulfate  300 mg Oral Q breakfast  . gabapentin  300 mg Oral BID  . insulin aspart  0-9 Units Subcutaneous 4 times per day  . magnesium oxide  400 mg Oral BID  . melatonin  3 mg Oral QHS  . metoprolol tartrate  50 mg Oral BID  . pantoprazole (PROTONIX) IV  40 mg Intravenous Q12H    Consultants:  Gastroenterology  Procedures:  None  Antibiotics:   Anti-infectives (From admission, onward)   None       Objective   Vitals:   07/22/19 2213 07/23/19 0201 07/23/19 0600 07/23/19 1109  BP: (!) 155/79 (!) 145/70 (!) 158/72 (!) 163/77  Pulse: 75 71 73 71  Resp: 16 16 20 20   Temp: 98.1 F (36.7 C) 98.4 F (36.9 C) 97.7 F (36.5 C) 98.7 F (37.1 C)  TempSrc:   Oral Oral Oral  SpO2: 98% 98% 98% 100%  Weight:      Height:        Intake/Output Summary (Last 24 hours) at 07/23/2019 1455 Last data filed at 07/23/2019 1203 Gross per 24 hour  Intake 3554.33 ml  Output 2000 ml  Net 1554.33 ml    05/10 1901 - 05/12 0700 In: 1749 [P.O.:1080; I.V.:2350.8] Out: 1500   Filed Weights   07/21/19 1248  Weight: 61 kg    Physical Examination:    General-appears in no acute distress  Heart-S1-S2, regular, no murmur auscultated  Lungs-clear to auscultation bilaterally, no wheezing or crackles auscultated  Abdomen-soft, nontender, no organomegaly  Extremities-s/p bilateral BKA  Neuro-alert, oriented x3, no focal deficit noted    Data Reviewed:   Recent Results (from the past 240 hour(s))  SARS Coronavirus 2 by RT PCR (hospital order, performed in [facility] hospital lab) Nasopharyngeal Nasopharyngeal Swab     Status: None   Collection Time: 07/21/19  3:06 PM   Specimen: Nasopharyngeal Swab  Result Value Ref Range Status   SARS Coronavirus 2 NEGATIVE NEGATIVE Final    Comment: (NOTE) SARS-CoV-2 target nucleic acids are NOT DETECTED. The SARS-CoV-2 RNA is generally detectable in upper and lower respiratory specimens during the acute phase of infection. The lowest concentration of SARS-CoV-2 viral copies this assay can detect is 250 copies / mL. A negative result does not preclude SARS-CoV-2 infection and should not be used as the sole basis for treatment or other patient management decisions.  A negative result may occur with improper specimen collection / handling, submission of specimen other than nasopharyngeal swab, presence of viral mutation(s) within the areas targeted by this assay, and inadequate number of viral copies (<250 copies / mL). A negative result must be combined with clinical observations, patient history, and epidemiological information. Fact Sheet for Patients:   StrictlyIdeas.no Fact  Sheet for Healthcare Providers: BankingDealers.co.za This test is not yet approved or cleared  by the [place] FDA and has been authorized for detection and/or diagnosis of SARS-CoV-2 by FDA under an Emergency Use Authorization (EUA).  This EUA will remain in effect (meaning this test can  be used) for the duration of the COVID-19 declaration under Section 564(b)(1) of the Act, 21 U.S.C. section 360bbb-3(b)(1), unless the authorization is terminated or revoked sooner. Performed at [facility], [place] [address], [place], [place] [address]      Studies:  No results found.     Oswald Hillock   [facility] If 7PM-7AM, please contact night-coverage at [email or web address], Office  [phone]   07/23/2019, 2:55 PM  LOS: 1 day

## 2019-07-23 NOTE — Progress Notes (Signed)
07/23/2019  1730  Patient BS 69. Gave 4oz of orange juice. Repeat BS 72. Patient eating clear liquid tray.

## 2019-07-23 NOTE — Plan of Care (Signed)
  Problem: Elimination: Goal: Will not experience complications related to bowel motility Outcome: Progressing   Problem: Safety: Goal: Ability to remain free from injury will improve Outcome: Progressing   Problem: Nutrition: Goal: Adequate nutrition will be maintained Outcome: Progressing

## 2019-07-23 NOTE — Progress Notes (Signed)
CRITICAL VALUE ALERT  Critical Value:  BS 69  Date & Time Notied:  07/23/2019  1715  Provider Notified: [place]  Orders Received/Actions taken: Orders

## 2019-07-24 ENCOUNTER — Inpatient Hospital Stay (HOSPITAL_COMMUNITY): Payer: Medicare Other | Admitting: Certified Registered Nurse Anesthetist

## 2019-07-24 ENCOUNTER — Encounter (HOSPITAL_COMMUNITY)
Admission: EM | Disposition: A | Discharge status: Home / Self Care | Payer: Self-pay | Source: Home / Self Care | Attending: Family Medicine

## 2019-07-24 HISTORY — PX: ESOPHAGOGASTRODUODENOSCOPY (EGD) WITH PROPOFOL: SHX5813

## 2019-07-24 LAB — COMPREHENSIVE METABOLIC PANEL
ALT: 9 U/L (ref 0–44)
AST: 13 U/L — ABNORMAL LOW (ref 15–41)
Albumin: 1.8 g/dL — ABNORMAL LOW (ref 3.5–5.0)
Alkaline Phosphatase: 52 U/L (ref 38–126)
Anion gap: 3 — ABNORMAL LOW (ref 5–15)
BUN: 35 mg/dL — ABNORMAL HIGH (ref 6–20)
CO2: 23 mmol/L (ref 22–32)
Calcium: 7.8 mg/dL — ABNORMAL LOW (ref 8.9–10.3)
Chloride: 109 mmol/L (ref 98–111)
Creatinine, Ser: 0.59 mg/dL (ref 0.44–1.00)
GFR calc Af Amer: >60 mL/min (ref >60)
GFR calc non Af Amer: >60 mL/min (ref >60)
Glucose, Bld: 184 mg/dL — ABNORMAL HIGH (ref 70–99)
Potassium: 4.7 mmol/L (ref 3.5–5.1)
Sodium: 135 mmol/L (ref 135–145)
Total Bilirubin: 0.5 mg/dL (ref 0.3–1.2)
Total Protein: 5.9 g/dL — ABNORMAL LOW (ref 6.5–8.1)

## 2019-07-24 LAB — CBC
HCT: 27 % — ABNORMAL LOW (ref 36.0–46.0)
Hemoglobin: 8.3 g/dL — ABNORMAL LOW (ref 12.0–15.0)
MCH: 28.1 pg (ref 26.0–34.0)
MCHC: 30.7 g/dL (ref 30.0–36.0)
MCV: 91.5 fL (ref 80.0–100.0)
Platelets: 247 10*3/uL (ref 150–400)
RBC: 2.95 MIL/uL — ABNORMAL LOW (ref 3.87–5.11)
RDW: 16.5 % — ABNORMAL HIGH (ref 11.5–15.5)
WBC: 5.3 10*3/uL (ref 4.0–10.5)
nRBC: 0 % (ref 0.0–0.2)

## 2019-07-24 LAB — GLUCOSE, CAPILLARY
Glucose-Capillary: 141 mg/dL — ABNORMAL HIGH (ref 70–99)
Glucose-Capillary: 186 mg/dL — ABNORMAL HIGH (ref 70–99)
Glucose-Capillary: 136 mg/dL — ABNORMAL HIGH (ref 70–99)
Glucose-Capillary: 140 mg/dL — ABNORMAL HIGH (ref 70–99)
Glucose-Capillary: 158 mg/dL — ABNORMAL HIGH (ref 70–99)
Glucose-Capillary: 116 mg/dL — ABNORMAL HIGH (ref 70–99)
Glucose-Capillary: 194 mg/dL — ABNORMAL HIGH (ref 70–99)

## 2019-07-24 LAB — PHOSPHORUS: Phosphorus: 3.2 mg/dL (ref 2.5–4.6)

## 2019-07-24 LAB — MAGNESIUM: Magnesium: 2 mg/dL (ref 1.7–2.4)

## 2019-07-24 SURGERY — ESOPHAGOGASTRODUODENOSCOPY (EGD) WITH PROPOFOL
Anesthesia: Monitor Anesthesia Care

## 2019-07-24 MED ORDER — PROPOFOL 10 MG/ML IV BOLUS
INTRAVENOUS | Status: DC | PRN
  Administered 2019-07-24 (×2): 20 mg via INTRAVENOUS
  Administered 2019-07-24: 10 mg via INTRAVENOUS

## 2019-07-24 MED ORDER — LACTATED RINGERS IV SOLN: INTRAVENOUS | Status: DC | PRN

## 2019-07-24 MED ORDER — LIDOCAINE 2% (20 MG/ML) 5 ML SYRINGE
INTRAMUSCULAR | Status: DC | PRN
  Administered 2019-07-24: 60 mg via INTRAVENOUS

## 2019-07-24 MED ORDER — PROPOFOL 10 MG/ML IV BOLUS
INTRAVENOUS | Status: AC
  Filled 2019-07-24: qty 20

## 2019-07-24 MED ORDER — PROPOFOL 500 MG/50ML IV EMUL
INTRAVENOUS | Status: AC
  Filled 2019-07-24: qty 50

## 2019-07-24 MED ORDER — TRAVASOL 10 % IV SOLN
INTRAVENOUS | Status: DC
  Filled 2019-07-24: qty 936

## 2019-07-24 MED ORDER — PROPOFOL 500 MG/50ML IV EMUL
INTRAVENOUS | Status: DC | PRN
  Administered 2019-07-24: 100 ug/kg/min via INTRAVENOUS

## 2019-07-24 SURGICAL SUPPLY — 25 items

## 2019-07-24 NOTE — Progress Notes (Signed)
Pt continued to refuse prep throughout the night stating "Its just to much to drink".  RN relayed Pt status to Endo RN this am. Will continue to monitor.

## 2019-07-24 NOTE — Transfer of Care (Signed)
Immediate Anesthesia Transfer of Care Note  Patient: Annalyssa Thune  Procedure(s) Performed: ESOPHAGOGASTRODUODENOSCOPY (EGD) WITH PROPOFOL (N/A )  Patient Location: Endoscopy Unit  Anesthesia Type:MAC  Level of Consciousness: drowsy and patient cooperative  Airway & Oxygen Therapy: Patient Spontanous Breathing and Patient connected to nasal cannula oxygen  Post-op Assessment: Report given to RN and Post -op Vital signs reviewed and stable  Post vital signs: Reviewed and stable  Last Vitals:  Vitals Value Taken Time  BP    Temp    Pulse 67 07/24/19 1006  Resp 17 07/24/19 1006  SpO2 100 % 07/24/19 1006  Vitals shown include unvalidated device data.  Last Pain:  Vitals:   07/24/19 0908  TempSrc: Oral  PainSc: 0-No pain      Patients Stated Pain Goal: 3 (03/00/92 3300)  Complications: No apparent anesthesia complications

## 2019-07-24 NOTE — Anesthesia Preprocedure Evaluation (Signed)
Anesthesia Evaluation  Patient identified by MRN, date of birth, ID band Patient awake    Reviewed: Allergy & Precautions, NPO status , Patient's Chart, lab work & pertinent test results  Airway Mallampati: I  TM Distance: >3 FB Neck ROM: Full    Dental   Pulmonary    Pulmonary exam normal        Cardiovascular hypertension, Pt. on medications Normal cardiovascular exam     Neuro/Psych Depression Schizophrenia    GI/Hepatic   Endo/Other  diabetes, Type 2  Renal/GU      Musculoskeletal   Abdominal   Peds  Hematology   Anesthesia Other Findings   Reproductive/Obstetrics                             Anesthesia Physical Anesthesia Plan  ASA: III  Anesthesia Plan: MAC   Post-op Pain Management:    Induction: Intravenous  PONV Risk Score and Plan: 2  Airway Management Planned: Nasal Cannula  Additional Equipment:   Intra-op Plan:   Post-operative Plan:   Informed Consent: I have reviewed the patients History and Physical, chart, labs and discussed the procedure including the risks, benefits and alternatives for the proposed anesthesia with the patient or authorized representative who has indicated his/her understanding and acceptance.       Plan Discussed with: CRNA and Surgeon  Anesthesia Plan Comments:         Anesthesia Quick Evaluation

## 2019-07-24 NOTE — Interval H&P Note (Signed)
History and Physical Interval Note:  07/24/2019 9:27 AM  Gina [name]  has presented today for surgery, with the diagnosis of Anemia.  The various methods of treatment have been discussed with the patient and family. After consideration of risks, benefits and other options for treatment, the patient has consented to  Procedure(s): COLONOSCOPY WITH PROPOFOL (N/A) ESOPHAGOGASTRODUODENOSCOPY (EGD) WITH PROPOFOL (N/A) as a surgical intervention.  The patient's history has been reviewed, patient examined, no change in status, stable for surgery.  I have reviewed the patient's chart and labs.  Questions were answered to the patient's satisfaction.     Lear Ng

## 2019-07-24 NOTE — Progress Notes (Signed)
PHARMACY - TOTAL PARENTERAL NUTRITION CONSULT NOTE   Indication: Fistula  Patient Measurements: Height: 5\' 2"  (157.5 cm) Weight: 61 kg (134 lb 7.7 oz) IBW/kg (Calculated) : 50.1 TPN AdjBW (KG): 61 Body mass index is 24.6 kg/m. Usual Weight: fluctuates between 55 and 61 kg since Oct 2020  Assessment: 59 yo female hx of perforated peptic ulcer s/p [name] patch Nov 8546 complicated by [facility] fistula s/p IR drainage now on chronic TPN since early 2021. PMH includes DM2, HTN, osteomyelitis with bilateral BKA's, schizophrenia, recent admissions for fungal peritonitis and MRSA bacteremia requiring PICC replacement. Admitted for the second time in 30 days with anemia and weakness. Pharmacy consulted to continue managing home TPN.  Glucose / Insulin:  Previously moderately well controlled (ranging from 100s to low 200s, no lows) CBGs 69-189, low of 69 given OJ, recheck up to 72, 6 units SSI/24 hrs, on D5NS at 50 ml/hr per TR[facility] Electrolytes: Lytes WNL, CoCa 9.56 Renal: SCr, bicarb WNL; BUN elevated c/w occult GIB (had +FOBT last admission; not ordered yet this admission) LFTs / TGs: WNL Prealbumin / albumin: 8.8 (5/11)/2 (5/11) Intake / Output; MIVF: abdominal drains removed during most recent admission, although still charted as in place in Epic I/O + 1228, plus urine x 1, 1850 mls urine & 450 mls other out/24 hrs D5NS at 50 ml/hr per MD GI Imaging:  EGD/colonoscopy planned for 5/13- pt refused prep Surgeries / Procedures: n/a Central access: PICC replaced 05/29/19 TPN start date: chronic, on PTA; resuming 5/11 as inpatient Nutritional Goals  Per RD 07/23/2019 Kcal:  1800-2000 Protein:  90-105g Fluid:  2L/day Current Nutrition: TPN & CLD w/ Boost Breeze TID (250 kcal, 9 gm protein each)  NPO 5/13 for EGD/colonoscopy Per [facility]:  TPN 3-in-1 1800 mL IV daily.    Base Solution: --Amino Acid: 90 gm/day; 664mL/day of 15% amino acids  (1.7gm/kg/day) --Dextrose: 180gm/day; 266mL/day of 70% dextrose     (GIR: 4.1mg /kg/min) --Lipids:50 gm/day ; 274mL/day of 20% smoflipids --Final %= AA: 5; Dextrose: 10; Lipids: 2.8     Provides 1472kcals/day(28kcal/kg/day) --Electrolytes/bag:   Calcium Gluconate: 0 mEq    Magnesium Sulfate: 75mEq       Potassium Acetate:  12.53mEq    Potassium Chloride: 0 mEq     Potassium Phosphate: 13.5 mmol       Sodium Acetate: 68mEq    Sodium Chloride: 85 mEq    Sodium Phosphate: 0 mmol --Additives/bag:   Trace Elements: 10mL    --Patient additives/bag:   MVI 10 mL      Plan:  Adjust TPN formula to meet RD goals Cyclic TPN 14 h with 1 h ramp up and 1 h ramp down providing ~1832 kcal and  ~ 94 g protein = 100 % support Electrolytes in TPN: 100 mEq/L of Na, 35 mEq/L of K , 0 mEq/L of Ca, 7.5 mEq/L of Mg, and 7.5 mmol/L of Phos. Cl:Ac ratio 1:1 Add standard MVI and trace elements to TPN continue Sensitive SSI with CBGs 0200, 0900, 1400, 2000 and adjust as needed  MIVF D5NS at 50 ml/hr per [facility] Monitor TPN labs on Mon/Thurs, BMET in AM F/U rate changes  Eudelia Bunch, Pharm.D [phone] 07/24/2019 7:44 AM

## 2019-07-24 NOTE — Brief Op Note (Signed)
Patient refused to drink colon prep so colonoscopy was cancelled. EGD showed minimal gastritis and small amount of irritation at duodenal [name] patch site without an ulcer seen. See endopro note for details. Clear liquid diet and advance diet as tolerated. Patient states that she will not drink the prep because it made her sick. Will sign off. Call if questions.

## 2019-07-24 NOTE — Anesthesia Postprocedure Evaluation (Signed)
Anesthesia Post Note  Patient: Gina [name]  Procedure(s) Performed: ESOPHAGOGASTRODUODENOSCOPY (EGD) WITH PROPOFOL (N/A )     Patient location during evaluation: PACU Anesthesia Type: MAC Level of consciousness: awake and alert Pain management: pain level controlled Vital Signs Assessment: post-procedure vital signs reviewed and stable Respiratory status: spontaneous breathing, nonlabored ventilation, respiratory function stable and patient connected to nasal cannula oxygen Cardiovascular status: stable and blood pressure returned to baseline Postop Assessment: no apparent nausea or vomiting Anesthetic complications: no    Last Vitals:  Vitals:   07/24/19 1020 07/24/19 1101  BP: (!) 165/80 (!) 158/78  Pulse: 65 63  Resp: 10 13  Temp:  36.8 C  SpO2: 100% 99%    Last Pain:  Vitals:   07/24/19 1104  TempSrc:   PainSc: 0-No pain                 Jaimy Kliethermes DAVID

## 2019-07-24 NOTE — Op Note (Addendum)
[facility] Patient Name: Gina [name] Procedure Date: 07/24/2019 MRN: [id number] Attending MD: Lear Ng , MD Date of Birth: 30-Apr-1960 CSN: [id number] Age: 59 Admit Type: Inpatient Procedure:                Upper GI endoscopy Indications:              Suspected upper gastrointestinal bleeding, Iron                            deficiency anemia Providers:                Lear Ng, MD, William Dalton,                            Technician, Clyde Lundborg, RN, Carmie End, RN Referring MD:             hospital team Medicines:                Propofol per Anesthesia, Monitored Anesthesia Care Complications:            No immediate complications. Estimated Blood Loss:     Estimated blood loss was minimal. Procedure:                Pre-Anesthesia Assessment:                           - Prior to the procedure, a History and Physical                            was performed, and patient medications and                            allergies were reviewed. The patient's tolerance of                            previous anesthesia was also reviewed. The risks                            and benefits of the procedure and the sedation                            options and risks were discussed with the patient.                            All questions were answered, and informed consent                            was obtained. Prior Anticoagulants: The patient has                            taken no previous anticoagulant or antiplatelet                            agents. ASA Grade Assessment: III - A patient with  severe systemic disease. After reviewing the risks                            and benefits, the patient was deemed in                            satisfactory condition to undergo the procedure.                           After obtaining informed consent, the endoscope was                            passed under direct  vision. Throughout the                            procedure, the patient's blood pressure, pulse, and                            oxygen saturations were monitored continuously. The                            [id number]-[id number] ([id number] Olympus gastroscope was                            introduced through the mouth, and advanced to the                            second part of duodenum. The upper GI endoscopy was                            accomplished without difficulty. The patient                            tolerated the procedure well. Scope In: Scope Out: Findings:      The examined esophagus was normal.      The Z-line was regular and was found 38 cm from the incisors.      Localized minimal inflammation characterized by erythema was found in       the prepyloric region of the stomach.      The cardia and gastric fundus were normal on retroflexion.      There was evidence of a closure of a perforated duodenal ulcer in the       duodenal bulb. This was characterized by congestion and erythema.      The exam of the duodenum was otherwise normal. Impression:               - Normal esophagus.                           - Z-line regular, 38 cm from the incisors.                           - Acute gastritis.                           -  Closure of a perforated duodenal ulcer,                            characterized by congestion and erythema was found.                           - No specimens collected. Moderate Sedation:      Not Applicable - Patient had care per Anesthesia. Recommendation:           - Clear liquid diet.                           - Observe patient's clinical course. Procedure Code(s):        --- Professional ---                           [id number], Esophagogastroduodenoscopy, flexible,                            transoral; diagnostic, including collection of                            specimen(s) by brushing or washing, when performed                            (separate  procedure) Diagnosis Code(s):        --- Professional ---                           D50.9, Iron deficiency anemia, unspecified                           K29.00, Acute gastritis without bleeding                           Z98.890, Other specified postprocedural states CPT copyright 2019 [employer]. All rights reserved. The codes documented in this report are preliminary and upon coder review may  be revised to meet current compliance requirements. Lear Ng, MD 07/24/2019 9:58:43 AM This report has been signed electronically. Number of Addenda: 0

## 2019-07-24 NOTE — Progress Notes (Signed)
[facility]  PROGRESS NOTE  Gina [name] KZL:[id number] DOB: 1960-08-16 DOA: 07/21/2019 PCP: [name], MD   Brief HPI:   59 year old female with a history of diabetes mellitus type 2, hypertension, PAD, bilateral BKA, osteomyelitis, schizophrenia, perforated peptic ulcer status post [name] patch in November 3903 complicated by enterocutaneous fistula requiring IR drain, on chronic TPN since early 2021, history of MRSA bacteremia in March 2021, was recently admitted to hospital from 07/04/2019 to 07/08/2019 for dyspepsia/anemia.  Patient again presented today with complaints of generalized weakness after outpatient lab work revealed worsening hemoglobin 6.6.  She received 2 units PRBC in April was seen by GI and underwent CT abdomen/pelvis for abdominal pain work-up but EGD was deferred by GI in previous admission.  She was discharged home on iron supplements and hemoglobin improved to 9 posttransfusion.     Subjective   Patient seen and examined, denies any complaints.  Plan for EGD and colonoscopy today.   Assessment/Plan:     1. Symptomatic anemia-secondary to GI bleed.  Hemoglobin is 8.3, dropped from 9.2 yesterday.  GI has been consulted.  Continue IV Protonix.  Patient is n.p.o.  Plan for EGD and colonoscopy today. 2. History of perforated gastric ulcer-status post [name] patch.  Patient is on PPI at home.  Continue IV Protonix.  GI following. 3. Diabetes mellitus type 2-patient is not on insulin or oral hypoglycemics at home.  Last A1c in March and April was less than 6.2.  Patient was hypoglycemic yesterday no started on D5 normal saline.  We will continue with sliding scale insulin NovoLog as blood sugar has improved. 4. Hypertension-blood pressure is stable continue home medications including amlodipine, metoprolol. 5. History of complicated peritonitis/candidemia/exploratory laparotomy-s/p IR abdominal drain removal in recent admission. 6. Peripheral arterial  disease/osteomyelitis/bilateral BKA/stump wounds-wound care consulted. 7. Pressure injury, bilateral coccyx stage II ulcer-wound care consulted. 8. Severe protein calorie malnutrition-continue TPN    SpO2: 99 % O2 Flow Rate (L/min): 2.5 L/min   COVID-19 Labs  No results for input(s): DDIMER, FERRITIN, LDH, CRP in the last 72 hours.  Lab Results  Component Value Date   SARSCOV2NAA NEGATIVE 07/21/2019   [place] NEGATIVE 07/04/2019   [place] NEGATIVE 05/22/2019   [place] NEGATIVE 05/21/2019     CBG: Recent Labs  Lab 07/24/19 0151 07/24/19 0415 07/24/19 0735 07/24/19 1155 07/24/19 1344  GLUCAP 141* 186* 136* 140* 158*    CBC: Recent Labs  Lab 07/21/19 1320 07/22/19 0518 07/24/19 0352  WBC 6.4 7.1 5.3  NEUTROABS 3.5 3.4  --   HGB 6.5* 9.2* 8.3*  HCT 21.0* 28.5* 27.0*  MCV 94.2 86.1 91.5  PLT 246 260 009    Basic Metabolic Panel: Recent Labs  Lab 07/21/19 1320 07/22/19 0518 07/23/19 0325 07/24/19 0352  NA 138 134* 134* 135  K 3.8 3.7 5.0 4.7  CL 106 105 107 109  CO2 24 24 25 23   GLUCOSE 73 67* 227* 184*  BUN 41* 31* 32* 35*  CREATININE 0.74 0.62 0.63 0.59  CALCIUM 8.3* 8.0* 7.9* 7.8*  MG  --  1.6* 2.0 2.0  PHOS  --  3.4 3.2 3.2     Liver Function Tests: Recent Labs  Lab 07/22/19 0518 07/23/19 0325 07/24/19 0352  AST 17 14* 13*  ALT 13 12 9   ALKPHOS 69 51 52  BILITOT 0.6 0.3 0.5  PROT 6.3* 6.0* 5.9*  ALBUMIN 2.0* 1.9* 1.8*        DVT prophylaxis: SCDs, no anticoagulants due to GI bleed.  Code Status: DNR  Family Communication: No family at bedside  Disposition Plan:   Status is: Inpatient  Dispo: The patient is from: Home              Anticipated d/c is to: Home              Anticipated d/c date is: 07/25/2019              Patient currently admitted for anemia due to GI bleed, plan for EGD and colonoscopy in a.m.  Pressure Injury 05/22/19 Coccyx Bilateral Stage 2 -  Partial thickness loss of dermis presenting as a  shallow open injury with a red, pink wound bed without slough. (Active)  05/22/19 2104  Location: Coccyx  Location Orientation: Bilateral  Staging: Stage 2 -  Partial thickness loss of dermis presenting as a shallow open injury with a red, pink wound bed without slough.  Wound Description (Comments):   Present on Admission: Yes     Pressure Injury 05/22/19 Knee Right;Distal Unstageable - Full thickness tissue loss in which the base of the injury is covered by slough (yellow, tan, gray, green or brown) and/or eschar (tan, brown or black) in the wound bed. Stump (Active)  05/22/19 2106  Location: Knee  Location Orientation: Right;Distal  Staging: Unstageable - Full thickness tissue loss in which the base of the injury is covered by slough (yellow, tan, gray, green or brown) and/or eschar (tan, brown or black) in the wound bed.  Wound Description (Comments): Stump  Present on Admission: Yes     Pressure Injury 05/22/19 Knee Left;Distal Unstageable - Full thickness tissue loss in which the base of the injury is covered by slough (yellow, tan, gray, green or brown) and/or eschar (tan, brown or black) in the wound bed. Stump (Active)  05/22/19 2108  Location: Knee  Location Orientation: Left;Distal  Staging: Unstageable - Full thickness tissue loss in which the base of the injury is covered by slough (yellow, tan, gray, green or brown) and/or eschar (tan, brown or black) in the wound bed.  Wound Description (Comments): Stump  Present on Admission: Yes     Pressure Injury 05/22/19 Thigh Left;Mid Stage 2 -  Partial thickness loss of dermis presenting as a shallow open injury with a red, pink wound bed without slough. (Active)  05/22/19 2109  Location: Thigh  Location Orientation: Left;Mid  Staging: Stage 2 -  Partial thickness loss of dermis presenting as a shallow open injury with a red, pink wound bed without slough.  Wound Description (Comments):   Present on Admission: Yes     Pressure  Injury 07/22/19 Sacrum Mid Stage 3 -  Full thickness tissue loss. Subcutaneous fat may be visible but bone, tendon or muscle are NOT exposed. (Active)  07/22/19   Location: Sacrum  Location Orientation: Mid  Staging: Stage 3 -  Full thickness tissue loss. Subcutaneous fat may be visible but bone, tendon or muscle are NOT exposed.  Wound Description (Comments):   Present on Admission: Yes     Pressure Injury 07/22/19 Leg Left Stage 3 -  Full thickness tissue loss. Subcutaneous fat may be visible but bone, tendon or muscle are NOT exposed. (Active)  07/22/19   Location: Leg  Location Orientation: Left  Staging: Stage 3 -  Full thickness tissue loss. Subcutaneous fat may be visible but bone, tendon or muscle are NOT exposed.  Wound Description (Comments):   Present on Admission: Yes     Pressure Injury 07/22/19 Leg Right  Stage 3 -  Full thickness tissue loss. Subcutaneous fat may be visible but bone, tendon or muscle are NOT exposed. (Active)  07/22/19   Location: Leg  Location Orientation: Right  Staging: Stage 3 -  Full thickness tissue loss. Subcutaneous fat may be visible but bone, tendon or muscle are NOT exposed.  Wound Description (Comments):   Present on Admission: Yes       Scheduled medications:  . amLODipine  5 mg Oral Daily  . ascorbic acid  500 mg Oral BID  . Chlorhexidine Gluconate Cloth  6 each Topical Daily  . escitalopram  20 mg Oral Daily  . feeding supplement  1 Container Oral BID BM  . ferrous sulfate  300 mg Oral Q breakfast  . gabapentin  300 mg Oral BID  . insulin aspart  0-9 Units Subcutaneous 4 times per day  . magnesium oxide  400 mg Oral BID  . melatonin  3 mg Oral QHS  . metoprolol tartrate  50 mg Oral BID  . pantoprazole (PROTONIX) IV  40 mg Intravenous Q12H    Consultants:  Gastroenterology  Procedures:  None  Antibiotics:   Anti-infectives (From admission, onward)   None       Objective   Vitals:   07/24/19 1005 07/24/19 1010  07/24/19 1020 07/24/19 1101  BP: (!) 146/90 (!) 152/87 (!) 165/80 (!) 158/78  Pulse: 68 66 65 63  Resp: (!) 21 17 10 13   Temp: 97.8 F (36.6 C)   98.3 F (36.8 C)  TempSrc: Axillary   Oral  SpO2: 100% 100% 100% 99%  Weight:      Height:        Intake/Output Summary (Last 24 hours) at 07/24/2019 1502 Last data filed at 07/24/2019 0954 Gross per 24 hour  Intake 3183.06 ml  Output 1250 ml  Net 1933.06 ml    05/11 1901 - 05/13 0700 In: 6836.2 [P.O.:1912; I.V.:4924.2] Out: 3800 [Urine:1850]  Filed Weights   07/21/19 1248  Weight: 61 kg    Physical Examination:   General-appears in no acute distress  Heart-S1-S2, regular, no murmur auscultated  Lungs-clear to auscultation bilaterally, no wheezing or crackles auscultated  Abdomen-soft, nontender, no organomegaly  Extremities-bilateral BKA  Neuro-alert, oriented x3, no focal deficit noted    Data Reviewed:   Recent Results (from the past 240 hour(s))  SARS Coronavirus 2 by RT PCR (hospital order, performed in [facility] hospital lab) Nasopharyngeal Nasopharyngeal Swab     Status: None   Collection Time: 07/21/19  3:06 PM   Specimen: Nasopharyngeal Swab  Result Value Ref Range Status   SARS Coronavirus 2 NEGATIVE NEGATIVE Final    Comment: (NOTE) SARS-CoV-2 target nucleic acids are NOT DETECTED. The SARS-CoV-2 RNA is generally detectable in upper and lower respiratory specimens during the acute phase of infection. The lowest concentration of SARS-CoV-2 viral copies this assay can detect is 250 copies / mL. A negative result does not preclude SARS-CoV-2 infection and should not be used as the sole basis for treatment or other patient management decisions.  A negative result may occur with improper specimen collection / handling, submission of specimen other than nasopharyngeal swab, presence of viral mutation(s) within the areas targeted by this assay, and inadequate number of viral copies (<250 copies / mL). A  negative result must be combined with clinical observations, patient history, and epidemiological information. Fact Sheet for Patients:   StrictlyIdeas.no Fact Sheet for Healthcare Providers: BankingDealers.co.za This test is not yet approved or cleared  by the [place] and has been authorized for detection and/or diagnosis of SARS-CoV-2 by FDA under an Emergency Use Authorization (EUA).  This EUA will remain in effect (meaning this test can be used) for the duration of the COVID-19 declaration under Section 564(b)(1) of the Act, 21 U.S.C. section 360bbb-3(b)(1), unless the authorization is terminated or revoked sooner. Performed at [facility], [place] [address] [place], [place] [address]         Oswald Hillock   [facility] If 7PM-7AM, please contact night-coverage at [email or web address], Office  [phone]   07/24/2019, 3:02 PM  LOS: 2 days

## 2019-07-25 LAB — CBC
HCT: 26 % — ABNORMAL LOW (ref 36.0–46.0)
Hemoglobin: 8 g/dL — ABNORMAL LOW (ref 12.0–15.0)
MCH: 28.6 pg (ref 26.0–34.0)
MCHC: 30.8 g/dL (ref 30.0–36.0)
MCV: 92.9 fL (ref 80.0–100.0)
Platelets: 239 10*3/uL (ref 150–400)
RBC: 2.8 MIL/uL — ABNORMAL LOW (ref 3.87–5.11)
RDW: 16.6 % — ABNORMAL HIGH (ref 11.5–15.5)
WBC: 6.5 10*3/uL (ref 4.0–10.5)
nRBC: 0 % (ref 0.0–0.2)

## 2019-07-25 LAB — BASIC METABOLIC PANEL
Anion gap: 3 — ABNORMAL LOW (ref 5–15)
BUN: 41 mg/dL — ABNORMAL HIGH (ref 6–20)
CO2: 22 mmol/L (ref 22–32)
Calcium: 7.9 mg/dL — ABNORMAL LOW (ref 8.9–10.3)
Chloride: 111 mmol/L (ref 98–111)
Creatinine, Ser: 0.61 mg/dL (ref 0.44–1.00)
GFR calc Af Amer: >60 mL/min (ref >60)
GFR calc non Af Amer: >60 mL/min (ref >60)
Glucose, Bld: 149 mg/dL — ABNORMAL HIGH (ref 70–99)
Potassium: 4.4 mmol/L (ref 3.5–5.1)
Sodium: 136 mmol/L (ref 135–145)

## 2019-07-25 LAB — GLUCOSE, CAPILLARY
Glucose-Capillary: 202 mg/dL — ABNORMAL HIGH (ref 70–99)
Glucose-Capillary: 183 mg/dL — ABNORMAL HIGH (ref 70–99)

## 2019-07-25 NOTE — Plan of Care (Signed)
  Problem: Education: Goal: Knowledge of General Education information will improve Description: Including pain rating scale, medication(s)/side effects and non-pharmacologic comfort measures Outcome: Adequate for Discharge   Problem: Health Behavior/Discharge Planning: Goal: Ability to manage health-related needs will improve Outcome: Adequate for Discharge   Problem: Clinical Measurements: Goal: Ability to maintain clinical measurements within normal limits will improve Outcome: Adequate for Discharge Goal: Will remain free from infection Outcome: Adequate for Discharge Goal: Diagnostic test results will improve Outcome: Adequate for Discharge Goal: Respiratory complications will improve Outcome: Adequate for Discharge Goal: Cardiovascular complication will be avoided Outcome: Adequate for Discharge   Problem: Activity: Goal: Risk for activity intolerance will decrease Outcome: Adequate for Discharge   Problem: Nutrition: Goal: Adequate nutrition will be maintained Outcome: Adequate for Discharge   Problem: Coping: Goal: Level of anxiety will decrease Outcome: Adequate for Discharge   Problem: Elimination: Goal: Will not experience complications related to bowel motility Outcome: Adequate for Discharge Goal: Will not experience complications related to urinary retention Outcome: Adequate for Discharge   Problem: Pain Managment: Goal: General experience of comfort will improve Outcome: Adequate for Discharge   Problem: Safety: Goal: Ability to remain free from injury will improve Outcome: Adequate for Discharge   Problem: Skin Integrity: Goal: Risk for impaired skin integrity will decrease Outcome: Adequate for Discharge   Problem: Education: Goal: Ability to identify signs and symptoms of gastrointestinal bleeding will improve Outcome: Adequate for Discharge   Problem: Bowel/Gastric: Goal: Will show no signs and symptoms of gastrointestinal bleeding Outcome:  Adequate for Discharge   Problem: Fluid Volume: Goal: Will show no signs and symptoms of excessive bleeding Outcome: Adequate for Discharge   Problem: Clinical Measurements: Goal: Complications related to the disease process, condition or treatment will be avoided or minimized Outcome: Adequate for Discharge   

## 2019-07-25 NOTE — Care Management Important Message (Signed)
Important Message  Patient Details IM Letter given to Roque Lias SW Case Manager to present to the Patient Name: Gina [name] MRN: [id number] Date of Birth: September 28, 1960   Medicare Important Message Given:  Yes     Kerin Salen 07/25/2019, 10:31 AM

## 2019-07-25 NOTE — Discharge Summary (Addendum)
Physician Discharge Summary  Gina [name] LSL:[id number] DOB: 1960-12-22 DOA: 07/21/2019  PCP: [name], MD  Admit date: 07/21/2019 Discharge date: 07/25/2019  Time spent: 50* minutes  Recommendations for Outpatient Follow-up:  1. Follow PCP in 1 week. 2. 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)   Severe protein-calorie malnutrition (HCC)   Depression   Type 2 diabetes mellitus with hyperosmolar nonketotic hyperglycemia (HCC)   Pressure injury of skin   Perforated gastric ulcer ([place])   Below-knee amputee ([place])   Anemia   Discharge Condition: Stable  Diet recommendation: Heart healthy diet  Filed Weights   07/21/19 1248  Weight: 61 kg    History of present illness:  59 year old female with a history of diabetes mellitus type 2, hypertension, PAD, bilateral BKA, osteomyelitis, schizophrenia, perforated peptic ulcer status post [name] patch in November 1157 complicated by enterocutaneous fistula requiring IR drain, on chronic TPN since early 2021, history of MRSA bacteremia in March 2021, was recently admitted to hospital from 07/04/2019 to 07/08/2019 for dyspepsia/anemia.  Patient again presented today with complaints of generalized weakness after outpatient lab work revealed worsening hemoglobin 6.6.  She received 2 units PRBC in April was seen by GI and underwent CT abdomen/pelvis for abdominal pain work-up but EGD was deferred by GI in previous admission.  She was discharged home on iron supplements and hemoglobin improved to 9 posttransfusion.  Hospital Course:   1. Symptomatic anemia-secondary to GI bleed.  Hemoglobin is 8.0, dropped from 8.3 yesterday.  Patient underwent EGD, which showed minimal gastritis and small amount of irritation at duodenal Graham patch site without an ulcer.  Patient is currently on Protonix 40 mg p.o.  daily.  She did not get a colonoscopy as she refused to drink the prep because it made her sick.  Colonoscopy was canceled.  Patient hemoglobin is stable at 8.0.  She will need CBC in 1 week to assess her hemoglobin and will require as needed blood transfusion.  2. History of perforated gastric ulcer-status post [name] patch.  Patient is on PPI at home.  Continue p.o. Protonix.  EGD showed small amount of irritation at duodenal Graham patch site without ulcer. 3. Diabetes mellitus type 2-patient is not on insulin or oral hypoglycemics at home.    Last hemoglobin A1c was 6.2. 4. Hypertension-blood pressure is stable continue home medications including amlodipine, metoprolol. 5. History of complicated peritonitis/candidemia/exploratory laparotomy-s/p IR abdominal drain removal in recent admission. 6. Peripheral arterial disease/osteomyelitis/bilateral BKA/stump wounds-continue wound care at home. 7. Pressure injury, bilateral coccyx stage II ulcer-wound care consulted. 8. Severe protein calorie malnutrition-continue TPN   Procedures:  EGD  Consultations:  Gastroenterology  Discharge Exam: Vitals:   07/24/19 2045 07/25/19 0600  BP: 140/67 (!) 126/52  Pulse: 75 79  Resp: 20 16  Temp: 98 F (36.7 C) 98.7 F (37.1 C)  SpO2: 99% 99%    General: Appears in no acute distress Cardiovascular: S1-S2, regular Respiratory: Clear to auscultation bilaterally  Discharge Instructions   Discharge Instructions    Diet - low sodium heart healthy   Complete by: As directed    Increase activity slowly   Complete by: As directed      Allergies as of 07/25/2019      Reactions   Bee Venom Swelling   Swells up with bee stings    Metformin And  Related Other (See Comments)   Upset stomach      Medication List    STOP taking these medications   nystatin 100000 UNIT/ML suspension Commonly known as: MYCOSTATIN     TAKE these medications   amLODipine 5 MG tablet Commonly known as:  NORVASC Take 5 mg by mouth daily. What changed: Another medication with the same name was removed. Continue taking this medication, and follow the directions you see here.   ascorbic acid 500 MG tablet Commonly known as: VITAMIN C Take 1 tablet (500 mg total) by mouth 2 (two) times daily.   ENSURE ENLIVE PO Take 237 mLs by mouth 2 (two) times daily with a meal.   escitalopram 20 MG tablet Commonly known as: LEXAPRO Take 20 mg by mouth daily.   ferrous sulfate 300 (60 Fe) MG/5ML syrup Take 5 mLs (300 mg total) by mouth daily with breakfast.   gabapentin 300 MG capsule Commonly known as: NEURONTIN Take 1 capsule (300 mg total) by mouth 2 (two) times daily.   loperamide 2 MG capsule Commonly known as: IMODIUM Take 1 capsule (2 mg total) by mouth every 8 (eight) hours as needed for diarrhea or loose stools.   Magnesium 400 MG Caps Take 400 mg by mouth 2 (two) times daily.   melatonin 3 MG Tabs tablet Take 1 tablet (3 mg total) by mouth at bedtime.   metoprolol tartrate 50 MG tablet Commonly known as: LOPRESSOR Take 1 tablet (50 mg total) by mouth 2 (two) times daily.   ondansetron 4 MG tablet Commonly known as: ZOFRAN Take 1 tablet (4 mg total) by mouth every 6 (six) hours as needed for nausea.   oxyCODONE 5 MG immediate release tablet Commonly known as: Oxy IR/ROXICODONE Take 1 tablet (5 mg total) by mouth every 6 (six) hours as needed (or pain).   pantoprazole 40 MG tablet Commonly known as: PROTONIX Take 1 tablet (40 mg total) by mouth daily.   simethicone 80 MG chewable tablet Commonly known as: MYLICON Chew 80 mg by mouth 4 (four) times daily as needed (indigestion).   sodium chloride 0.65 % Soln nasal spray Commonly known as: OCEAN Place 1 spray into both nostrils every 4 (four) hours as needed (dry nasal membranes).      Allergies  Allergen Reactions  . Bee Venom Swelling    Swells up with bee stings   . Metformin And Related Other (See Comments)     Upset stomach   Follow-up Information    [name], MD Follow up in 1 week(s).   Specialty: Internal Medicine Why: Check CBC in one week as outpatient Contact information: [place] [address] [phone]            The results of significant diagnostics from this hospitalization (including imaging, microbiology, ancillary and laboratory) are listed below for reference.    Significant Diagnostic Studies: CT ABDOMEN PELVIS W CONTRAST  Result Date: 07/05/2019 CLINICAL DATA:  Anemia, history of perforated duodenal ulcer complicated by enteric cutaneous fistula EXAM: CT ABDOMEN AND PELVIS WITH CONTRAST TECHNIQUE: Multidetector CT imaging of the abdomen and pelvis was performed using the standard protocol following bolus administration of intravenous contrast. CONTRAST:  150mL OMNIPAQUE IOHEXOL 300 MG/ML SOLN, additional oral enteric contrast COMPARISON:  05/22/2019, 04/11/2019 FINDINGS: Lower chest: Redemonstrated dense atelectasis or scarring of the right greater than left lung bases with a trace residual right pleural effusion, significantly decreased compared to prior examination. Hepatobiliary: No solid liver abnormality is seen. Gallstones  and gravel in the gallbladder. Periportal edema, similar to prior examination. No significant biliary ductal dilatation. Pancreas: Unremarkable. No pancreatic ductal dilatation or surrounding inflammatory changes. Spleen: Normal in size without significant abnormality. Adrenals/Urinary Tract: Adrenal glands are unremarkable. Kidneys are normal, without renal calculi, solid lesion, or hydronephrosis. Foley catheter decompressing the urinary bladder. Thickened urinary bladder wall. Stomach/Bowel: Stomach is within normal limits. No evidence of bowel wall thickening, distention, or inflammatory changes. Large burden of stool throughout the Kemar Pandit. Vascular/Lymphatic: Aortic atherosclerosis. No enlarged abdominal or pelvic lymph nodes.  Reproductive: No mass or other significant abnormality. Other: Redemonstrated flat, elongated air and contrast containing ventral peritoneal cavity, with a left approach percutaneous pigtail drainage catheter (series 2, image 43). There is fistula to the skin with overlying wound dressing. This cavity measures approximately 13.0 x 7.5 x 0.7 cm and is similar to prior examination. A previously seen right approach surgical drain has been removed. Evident nidus of contrast fistulation is to small bowel in the left hemiabdomen (series 2, image 49). Musculoskeletal: No acute or significant osseous findings. IMPRESSION: 1. Redemonstrated flat, elongated air and contrast containing ventral peritoneal cavity, with a left approach percutaneous pigtail drainage catheter. There is a fistula to the skin with overlying wound dressing and the evident nidus of contrast is small bowel in the left hemiabdomen. 2. Periportal edema, similar to prior examination and of uncertain significance, possibly reactive to adjacent inflammation. 3.  Cholelithiasis. 4.  Aortic Atherosclerosis (ICD10-I70.0). Electronically Signed   By: Eddie Candle M.D.   On: 07/05/2019 16:03   DG Chest Port 1 View  Result Date: 07/04/2019 CLINICAL DATA:  Nausea and vomiting, anemia, PICC placement EXAM: PORTABLE CHEST 1 VIEW COMPARISON:  06/01/2019 FINDINGS: Single frontal view of the chest demonstrates right-sided PICC tip overlying superior vena cava. The cardiac silhouette is unremarkable. No airspace disease, effusion, or pneumothorax. No acute bony abnormalities. IMPRESSION: 1. Right PICC overlying superior vena cava. 2. No acute intrathoracic process. Electronically Signed   By: Randa Ngo M.D.   On: 07/04/2019 16:36    Microbiology: Recent Results (from the past 240 hour(s))  SARS Coronavirus 2 by RT PCR (hospital order, performed in [facility] hospital lab) Nasopharyngeal Nasopharyngeal Swab     Status: None   Collection Time: 07/21/19   3:06 PM   Specimen: Nasopharyngeal Swab  Result Value Ref Range Status   SARS Coronavirus 2 NEGATIVE NEGATIVE Final    Comment: (NOTE) SARS-CoV-2 target nucleic acids are NOT DETECTED. The SARS-CoV-2 RNA is generally detectable in upper and lower respiratory specimens during the acute phase of infection. The lowest concentration of SARS-CoV-2 viral copies this assay can detect is 250 copies / mL. A negative result does not preclude SARS-CoV-2 infection and should not be used as the sole basis for treatment or other patient management decisions.  A negative result may occur with improper specimen collection / handling, submission of specimen other than nasopharyngeal swab, presence of viral mutation(s) within the areas targeted by this assay, and inadequate number of viral copies (<250 copies / mL). A negative result must be combined with clinical observations, patient history, and epidemiological information. Fact Sheet for Patients:   StrictlyIdeas.no Fact Sheet for Healthcare Providers: BankingDealers.co.za This test is not yet approved or cleared  by the [place] FDA and has been authorized for detection and/or diagnosis of SARS-CoV-2 by FDA under an Emergency Use Authorization (EUA).  This EUA will remain in effect (meaning this test can be used) for the duration of the  COVID-19 declaration under Section 564(b)(1) of the Act, 21 U.S.C. section 360bbb-3(b)(1), unless the authorization is terminated or revoked sooner. Performed at [facility], [place] [address], [place],  [address]      Labs: Basic Metabolic Panel: Recent Labs  Lab 07/21/19 1320 07/22/19 0518 07/23/19 0325 07/24/19 0352 07/25/19 0539  NA 138 134* 134* 135 136  K 3.8 3.7 5.0 4.7 4.4  CL 106 105 107 109 111  CO2 24 24 25 23 22   GLUCOSE 73 67* 227* 184* 149*  BUN 41* 31* 32* 35* 41*  CREATININE 0.74 0.62 0.63 0.59 0.61  CALCIUM  8.3* 8.0* 7.9* 7.8* 7.9*  MG  --  1.6* 2.0 2.0  --   PHOS  --  3.4 3.2 3.2  --    Liver Function Tests: Recent Labs  Lab 07/22/19 0518 07/23/19 0325 07/24/19 0352  AST 17 14* 13*  ALT 13 12 9   ALKPHOS 69 51 52  BILITOT 0.6 0.3 0.5  PROT 6.3* 6.0* 5.9*  ALBUMIN 2.0* 1.9* 1.8*   No results for input(s): LIPASE, AMYLASE in the last 168 hours. No results for input(s): AMMONIA in the last 168 hours. CBC: Recent Labs  Lab 07/21/19 1320 07/22/19 0518 07/24/19 0352 07/25/19 0539  WBC 6.4 7.1 5.3 6.5  NEUTROABS 3.5 3.4  --   --   HGB 6.5* 9.2* 8.3* 8.0*  HCT 21.0* 28.5* 27.0* 26.0*  MCV 94.2 86.1 91.5 92.9  PLT 246 260 247 239    CBG: Recent Labs  Lab 07/24/19 1344 07/24/19 1645 07/24/19 2049 07/25/19 0324 07/25/19 0735  GLUCAP 158* 116* 194* 202* 183*       Signed:  Oswald Hillock MD.  [facility] 07/25/2019, 12:42 PM

## 2019-07-29 ENCOUNTER — Other Ambulatory Visit: Payer: Self-pay | Admitting: Physician Assistant

## 2019-07-29 ENCOUNTER — Encounter: Payer: Self-pay | Admitting: *Deleted

## 2019-07-29 ENCOUNTER — Telehealth: Payer: Self-pay | Admitting: Physician Assistant

## 2019-07-29 ENCOUNTER — Inpatient Hospital Stay: Payer: Medicare Other | Attending: Physician Assistant

## 2019-07-29 ENCOUNTER — Other Ambulatory Visit: Payer: Self-pay

## 2019-07-29 ENCOUNTER — Inpatient Hospital Stay: Payer: Medicare Other | Admitting: Physician Assistant

## 2019-07-29 DIAGNOSIS — D500 Iron deficiency anemia secondary to blood loss (chronic): Secondary | ICD-10-CM

## 2019-07-29 NOTE — Telephone Encounter (Signed)
Rescheduled 05/18 appointment to 05/25, patient is notified.

## 2019-08-05 ENCOUNTER — Other Ambulatory Visit: Payer: Self-pay | Admitting: Physician Assistant

## 2019-08-05 ENCOUNTER — Inpatient Hospital Stay: Payer: Medicare Other | Admitting: Physician Assistant

## 2019-08-05 ENCOUNTER — Inpatient Hospital Stay: Payer: Medicare Other

## 2019-08-05 ENCOUNTER — Telehealth: Payer: Self-pay | Admitting: Physician Assistant

## 2019-08-05 DIAGNOSIS — D500 Iron deficiency anemia secondary to blood loss (chronic): Secondary | ICD-10-CM

## 2019-08-05 DIAGNOSIS — D649 Anemia, unspecified: Secondary | ICD-10-CM

## 2019-08-05 NOTE — Telephone Encounter (Signed)
Per staff from [facility] pt was more than hour late to her appt on 5/18. Pt was then rescheduled to 5/25 and no showed. As of today her referral will be closed. Referring office has been notified.

## 2019-08-17 ENCOUNTER — Inpatient Hospital Stay (HOSPITAL_COMMUNITY)
Admission: EM | Admit: 2019-08-17 | Discharge: 2019-08-29 | DRG: 314 | Disposition: A | Discharge status: Home Health | Payer: Medicare HMO | Attending: Internal Medicine | Admitting: Internal Medicine

## 2019-08-17 ENCOUNTER — Other Ambulatory Visit: Payer: Self-pay

## 2019-08-17 ENCOUNTER — Emergency Department (HOSPITAL_COMMUNITY): Payer: Medicare HMO

## 2019-08-17 DIAGNOSIS — Z89511 Acquired absence of right leg below knee: Secondary | ICD-10-CM

## 2019-08-17 DIAGNOSIS — N179 Acute kidney failure, unspecified: Secondary | ICD-10-CM | POA: Diagnosis present

## 2019-08-17 DIAGNOSIS — N390 Urinary tract infection, site not specified: Secondary | ICD-10-CM | POA: Diagnosis present

## 2019-08-17 DIAGNOSIS — R739 Hyperglycemia, unspecified: Secondary | ICD-10-CM

## 2019-08-17 DIAGNOSIS — Z8249 Family history of ischemic heart disease and other diseases of the circulatory system: Secondary | ICD-10-CM | POA: Diagnosis not present

## 2019-08-17 DIAGNOSIS — Z6820 Body mass index (BMI) 20.0-20.9, adult: Secondary | ICD-10-CM | POA: Diagnosis not present

## 2019-08-17 DIAGNOSIS — E1151 Type 2 diabetes mellitus with diabetic peripheral angiopathy without gangrene: Secondary | ICD-10-CM | POA: Diagnosis present

## 2019-08-17 DIAGNOSIS — E1165 Type 2 diabetes mellitus with hyperglycemia: Secondary | ICD-10-CM | POA: Diagnosis not present

## 2019-08-17 DIAGNOSIS — T80211A Bloodstream infection due to central venous catheter, initial encounter: Secondary | ICD-10-CM | POA: Diagnosis present

## 2019-08-17 DIAGNOSIS — D638 Anemia in other chronic diseases classified elsewhere: Secondary | ICD-10-CM | POA: Diagnosis present

## 2019-08-17 DIAGNOSIS — E1136 Type 2 diabetes mellitus with diabetic cataract: Secondary | ICD-10-CM | POA: Diagnosis present

## 2019-08-17 DIAGNOSIS — D509 Iron deficiency anemia, unspecified: Secondary | ICD-10-CM | POA: Diagnosis present

## 2019-08-17 DIAGNOSIS — D500 Iron deficiency anemia secondary to blood loss (chronic): Secondary | ICD-10-CM | POA: Diagnosis present

## 2019-08-17 DIAGNOSIS — H269 Unspecified cataract: Secondary | ICD-10-CM | POA: Diagnosis present

## 2019-08-17 DIAGNOSIS — B377 Candidal sepsis: Secondary | ICD-10-CM | POA: Diagnosis present

## 2019-08-17 DIAGNOSIS — A419 Sepsis, unspecified organism: Secondary | ICD-10-CM | POA: Diagnosis not present

## 2019-08-17 DIAGNOSIS — Z933 Colostomy status: Secondary | ICD-10-CM | POA: Diagnosis not present

## 2019-08-17 DIAGNOSIS — Z79899 Other long term (current) drug therapy: Secondary | ICD-10-CM

## 2019-08-17 DIAGNOSIS — I10 Essential (primary) hypertension: Secondary | ICD-10-CM | POA: Diagnosis present

## 2019-08-17 DIAGNOSIS — Z89512 Acquired absence of left leg below knee: Secondary | ICD-10-CM

## 2019-08-17 DIAGNOSIS — E43 Unspecified severe protein-calorie malnutrition: Secondary | ICD-10-CM | POA: Diagnosis present

## 2019-08-17 DIAGNOSIS — Z20822 Contact with and (suspected) exposure to covid-19: Secondary | ICD-10-CM | POA: Diagnosis present

## 2019-08-17 DIAGNOSIS — Z833 Family history of diabetes mellitus: Secondary | ICD-10-CM

## 2019-08-17 DIAGNOSIS — B379 Candidiasis, unspecified: Secondary | ICD-10-CM | POA: Diagnosis not present

## 2019-08-17 DIAGNOSIS — Z66 Do not resuscitate: Secondary | ICD-10-CM | POA: Diagnosis present

## 2019-08-17 DIAGNOSIS — L89152 Pressure ulcer of sacral region, stage 2: Secondary | ICD-10-CM | POA: Diagnosis present

## 2019-08-17 DIAGNOSIS — K632 Fistula of intestine: Secondary | ICD-10-CM | POA: Diagnosis present

## 2019-08-17 DIAGNOSIS — R509 Fever, unspecified: Secondary | ICD-10-CM | POA: Diagnosis present

## 2019-08-17 DIAGNOSIS — Z794 Long term (current) use of insulin: Secondary | ICD-10-CM

## 2019-08-17 DIAGNOSIS — D65 Disseminated intravascular coagulation [defibrination syndrome]: Secondary | ICD-10-CM | POA: Diagnosis not present

## 2019-08-17 DIAGNOSIS — M79606 Pain in leg, unspecified: Secondary | ICD-10-CM | POA: Diagnosis not present

## 2019-08-17 DIAGNOSIS — D649 Anemia, unspecified: Secondary | ICD-10-CM | POA: Diagnosis present

## 2019-08-17 DIAGNOSIS — Z8042 Family history of malignant neoplasm of prostate: Secondary | ICD-10-CM | POA: Diagnosis not present

## 2019-08-17 DIAGNOSIS — Z8711 Personal history of peptic ulcer disease: Secondary | ICD-10-CM

## 2019-08-17 DIAGNOSIS — Z8614 Personal history of Methicillin resistant Staphylococcus aureus infection: Secondary | ICD-10-CM | POA: Diagnosis not present

## 2019-08-17 DIAGNOSIS — L89159 Pressure ulcer of sacral region, unspecified stage: Secondary | ICD-10-CM | POA: Diagnosis present

## 2019-08-17 DIAGNOSIS — E1159 Type 2 diabetes mellitus with other circulatory complications: Secondary | ICD-10-CM

## 2019-08-17 DIAGNOSIS — B964 Proteus (mirabilis) (morganii) as the cause of diseases classified elsewhere: Secondary | ICD-10-CM | POA: Diagnosis not present

## 2019-08-17 DIAGNOSIS — R6520 Severe sepsis without septic shock: Secondary | ICD-10-CM | POA: Diagnosis not present

## 2019-08-17 DIAGNOSIS — A021 Salmonella sepsis: Secondary | ICD-10-CM

## 2019-08-17 DIAGNOSIS — R52 Pain, unspecified: Secondary | ICD-10-CM

## 2019-08-17 DIAGNOSIS — F209 Schizophrenia, unspecified: Secondary | ICD-10-CM | POA: Diagnosis present

## 2019-08-17 LAB — BASIC METABOLIC PANEL
Anion gap: 12 (ref 5–15)
Anion gap: 8 (ref 5–15)
BUN: 70 mg/dL — ABNORMAL HIGH (ref 6–20)
BUN: 68 mg/dL — ABNORMAL HIGH (ref 6–20)
CO2: 20 mmol/L — ABNORMAL LOW (ref 22–32)
CO2: 22 mmol/L (ref 22–32)
Calcium: 8.2 mg/dL — ABNORMAL LOW (ref 8.9–10.3)
Calcium: 8.2 mg/dL — ABNORMAL LOW (ref 8.9–10.3)
Chloride: 97 mmol/L — ABNORMAL LOW (ref 98–111)
Chloride: 101 mmol/L (ref 98–111)
Creatinine, Ser: 1.42 mg/dL — ABNORMAL HIGH (ref 0.44–1.00)
Creatinine, Ser: 1.34 mg/dL — ABNORMAL HIGH (ref 0.44–1.00)
GFR calc Af Amer: 47 mL/min — ABNORMAL LOW (ref >60)
GFR calc Af Amer: 50 mL/min — ABNORMAL LOW (ref >60)
GFR calc non Af Amer: 41 mL/min — ABNORMAL LOW (ref >60)
GFR calc non Af Amer: 44 mL/min — ABNORMAL LOW (ref >60)
Glucose, Bld: 602 mg/dL (ref 70–99)
Glucose, Bld: 177 mg/dL — ABNORMAL HIGH (ref 70–99)
Potassium: 5.1 mmol/L (ref 3.5–5.1)
Potassium: 5.3 mmol/L — ABNORMAL HIGH (ref 3.5–5.1)
Sodium: 129 mmol/L — ABNORMAL LOW (ref 135–145)
Sodium: 131 mmol/L — ABNORMAL LOW (ref 135–145)

## 2019-08-17 LAB — CBC WITH DIFFERENTIAL/PLATELET
Abs Immature Granulocytes: 0.02 10*3/uL (ref 0.00–0.07)
Basophils Absolute: 0 10*3/uL (ref 0.0–0.1)
Basophils Relative: 0 %
Eosinophils Absolute: 0 10*3/uL (ref 0.0–0.5)
Eosinophils Relative: 0 %
HCT: 24.4 % — ABNORMAL LOW (ref 36.0–46.0)
Hemoglobin: 7.5 g/dL — ABNORMAL LOW (ref 12.0–15.0)
Immature Granulocytes: 0 %
Lymphocytes Relative: 8 %
Lymphs Abs: 0.4 10*3/uL — ABNORMAL LOW (ref 0.7–4.0)
MCH: 28.5 pg (ref 26.0–34.0)
MCHC: 30.7 g/dL (ref 30.0–36.0)
MCV: 92.8 fL (ref 80.0–100.0)
Monocytes Absolute: 0.3 10*3/uL (ref 0.1–1.0)
Monocytes Relative: 5 %
Neutro Abs: 4.1 10*3/uL (ref 1.7–7.7)
Neutrophils Relative %: 87 %
Platelets: 195 10*3/uL (ref 150–400)
RBC: 2.63 MIL/uL — ABNORMAL LOW (ref 3.87–5.11)
RDW: 16.6 % — ABNORMAL HIGH (ref 11.5–15.5)
WBC: 4.8 10*3/uL (ref 4.0–10.5)
nRBC: 0 % (ref 0.0–0.2)

## 2019-08-17 LAB — GLUCOSE, CAPILLARY: Glucose-Capillary: 271 mg/dL — ABNORMAL HIGH (ref 70–99)

## 2019-08-17 LAB — CBG MONITORING, ED
Glucose-Capillary: >600 mg/dL (ref 70–99)
Glucose-Capillary: 557 mg/dL (ref 70–99)
Glucose-Capillary: 430 mg/dL — ABNORMAL HIGH (ref 70–99)
Glucose-Capillary: 165 mg/dL — ABNORMAL HIGH (ref 70–99)
Glucose-Capillary: 128 mg/dL — ABNORMAL HIGH (ref 70–99)

## 2019-08-17 LAB — COMPREHENSIVE METABOLIC PANEL
ALT: 20 U/L (ref 0–44)
AST: 20 U/L (ref 15–41)
Albumin: 1.9 g/dL — ABNORMAL LOW (ref 3.5–5.0)
Alkaline Phosphatase: 72 U/L (ref 38–126)
Anion gap: 13 (ref 5–15)
BUN: 74 mg/dL — ABNORMAL HIGH (ref 6–20)
CO2: 19 mmol/L — ABNORMAL LOW (ref 22–32)
Calcium: 8.6 mg/dL — ABNORMAL LOW (ref 8.9–10.3)
Chloride: 96 mmol/L — ABNORMAL LOW (ref 98–111)
Creatinine, Ser: 1.4 mg/dL — ABNORMAL HIGH (ref 0.44–1.00)
GFR calc Af Amer: 48 mL/min — ABNORMAL LOW (ref >60)
GFR calc non Af Amer: 41 mL/min — ABNORMAL LOW (ref >60)
Glucose, Bld: 746 mg/dL (ref 70–99)
Potassium: 5.3 mmol/L — ABNORMAL HIGH (ref 3.5–5.1)
Sodium: 128 mmol/L — ABNORMAL LOW (ref 135–145)
Total Bilirubin: 0.6 mg/dL (ref 0.3–1.2)
Total Protein: 7.8 g/dL (ref 6.5–8.1)

## 2019-08-17 LAB — URINALYSIS, ROUTINE W REFLEX MICROSCOPIC
Bilirubin Urine: NEGATIVE
Glucose, UA: >=500 mg/dL — AB
Ketones, ur: NEGATIVE mg/dL
Nitrite: NEGATIVE
Protein, ur: >=300 mg/dL — AB
Specific Gravity, Urine: 1.015 (ref 1.005–1.030)
pH: 8 (ref 5.0–8.0)

## 2019-08-17 LAB — I-STAT VENOUS BLOOD GAS, ED
Acid-base deficit: 5 mmol/L — ABNORMAL HIGH (ref 0.0–2.0)
Bicarbonate: 20.7 mmol/L (ref 20.0–28.0)
Calcium, Ion: 1.19 mmol/L (ref 1.15–1.40)
HCT: 20 % — ABNORMAL LOW (ref 36.0–46.0)
Hemoglobin: 6.8 g/dL — CL (ref 12.0–15.0)
O2 Saturation: 56 %
Potassium: 5 mmol/L (ref 3.5–5.1)
Sodium: 131 mmol/L — ABNORMAL LOW (ref 135–145)
TCO2: 22 mmol/L (ref 22–32)
pCO2, Ven: 38 mmHg — ABNORMAL LOW (ref 44.0–60.0)
pH, Ven: 7.343 (ref 7.250–7.430)
pO2, Ven: 31 mmHg — CL (ref 32.0–45.0)

## 2019-08-17 LAB — SARS CORONAVIRUS 2 BY RT PCR (HOSPITAL ORDER, PERFORMED IN ~~LOC~~ HOSPITAL LAB): SARS Coronavirus 2: NEGATIVE

## 2019-08-17 LAB — OSMOLALITY: Osmolality: 322 mOsm/kg (ref 275–295)

## 2019-08-17 LAB — LIPASE, BLOOD: Lipase: 21 U/L (ref 11–51)

## 2019-08-17 LAB — LACTIC ACID, PLASMA
Lactic Acid, Venous: 1.8 mmol/L (ref 0.5–1.9)
Lactic Acid, Venous: 2.6 mmol/L (ref 0.5–1.9)

## 2019-08-17 LAB — BETA-HYDROXYBUTYRIC ACID: Beta-Hydroxybutyric Acid: 0.06 mmol/L (ref 0.05–0.27)

## 2019-08-17 LAB — APTT: aPTT: 35 seconds (ref 24–36)

## 2019-08-17 LAB — MRSA PCR SCREENING: MRSA by PCR: POSITIVE — AB

## 2019-08-17 LAB — PROTIME-INR
INR: 1.2 (ref 0.8–1.2)
Prothrombin Time: 14.9 seconds (ref 11.4–15.2)

## 2019-08-17 MED ORDER — FERROUS SULFATE 300 (60 FE) MG/5ML PO SYRP
300.0000 mg | ORAL_SOLUTION | Freq: Every day | ORAL | Status: DC
  Administered 2019-08-19 – 2019-08-29 (×10): 300 mg via ORAL
  Filled 2019-08-17 (×13): qty 5

## 2019-08-17 MED ORDER — INSULIN REGULAR(HUMAN) IN NACL 100-0.9 UT/100ML-% IV SOLN
INTRAVENOUS | Status: DC
  Administered 2019-08-17: 7.5 [IU]/h via INTRAVENOUS
  Filled 2019-08-17: qty 100

## 2019-08-17 MED ORDER — MELATONIN 3 MG PO TABS
3.0000 mg | ORAL_TABLET | Freq: Every day | ORAL | Status: DC
  Administered 2019-08-17 – 2019-08-29 (×13): 3 mg via ORAL
  Filled 2019-08-17 (×14): qty 1

## 2019-08-17 MED ORDER — CHLORHEXIDINE GLUCONATE CLOTH 2 % EX PADS
6.0000 | MEDICATED_PAD | Freq: Every day | CUTANEOUS | Status: DC
  Administered 2019-08-17 – 2019-08-28 (×10): 6 via TOPICAL

## 2019-08-17 MED ORDER — ONDANSETRON HCL 4 MG PO TABS
4.0000 mg | ORAL_TABLET | Freq: Four times a day (QID) | ORAL | Status: DC | PRN
  Administered 2019-08-22 – 2019-08-23 (×2): 4 mg via ORAL
  Filled 2019-08-17 (×2): qty 1

## 2019-08-17 MED ORDER — ALBUMIN HUMAN 25 % IV SOLN
12.5000 g | Freq: Four times a day (QID) | INTRAVENOUS | Status: AC
  Administered 2019-08-17 – 2019-08-18 (×4): 12.5 g via INTRAVENOUS
  Filled 2019-08-17 (×4): qty 50

## 2019-08-17 MED ORDER — SODIUM CHLORIDE 0.9 % IV SOLN: INTRAVENOUS | Status: DC

## 2019-08-17 MED ORDER — SODIUM CHLORIDE 0.9 % IV SOLN
2.0000 g | Freq: Once | INTRAVENOUS | Status: AC
  Administered 2019-08-17: 2 g via INTRAVENOUS
  Filled 2019-08-17: qty 2

## 2019-08-17 MED ORDER — DEXTROSE 50 % IV SOLN
0.0000 mL | INTRAVENOUS | Status: DC | PRN
  Administered 2019-08-18: 25 mL via INTRAVENOUS
  Filled 2019-08-17: qty 50

## 2019-08-17 MED ORDER — DEXTROSE-NACL 5-0.45 % IV SOLN: INTRAVENOUS | Status: DC

## 2019-08-17 MED ORDER — LACTATED RINGERS IV BOLUS (SEPSIS)
1000.0000 mL | Freq: Once | INTRAVENOUS | Status: AC
  Administered 2019-08-17: 1000 mL via INTRAVENOUS

## 2019-08-17 MED ORDER — ASCORBIC ACID 500 MG PO TABS
500.0000 mg | ORAL_TABLET | Freq: Two times a day (BID) | ORAL | Status: DC
  Administered 2019-08-17 – 2019-08-29 (×25): 500 mg via ORAL
  Filled 2019-08-17 (×25): qty 1

## 2019-08-17 MED ORDER — ENOXAPARIN SODIUM 40 MG/0.4ML ~~LOC~~ SOLN: 40.0000 mg | SUBCUTANEOUS | Status: DC

## 2019-08-17 MED ORDER — MUPIROCIN 2 % EX OINT
1.0000 "application " | TOPICAL_OINTMENT | Freq: Two times a day (BID) | CUTANEOUS | Status: AC
  Administered 2019-08-18 – 2019-08-22 (×10): 1 via NASAL
  Filled 2019-08-17: qty 22

## 2019-08-17 MED ORDER — ESCITALOPRAM OXALATE 10 MG PO TABS
20.0000 mg | ORAL_TABLET | Freq: Every day | ORAL | Status: DC
  Administered 2019-08-18 – 2019-08-29 (×12): 20 mg via ORAL
  Filled 2019-08-17 (×12): qty 2

## 2019-08-17 MED ORDER — LOPERAMIDE HCL 2 MG PO CAPS: 2.0000 mg | ORAL_CAPSULE | Freq: Three times a day (TID) | ORAL | Status: DC | PRN

## 2019-08-17 MED ORDER — VANCOMYCIN HCL IN DEXTROSE 1-5 GM/200ML-% IV SOLN
1000.0000 mg | Freq: Once | INTRAVENOUS | Status: AC
  Administered 2019-08-17: 1000 mg via INTRAVENOUS
  Filled 2019-08-17: qty 200

## 2019-08-17 MED ORDER — ACETAMINOPHEN 325 MG PO TABS
650.0000 mg | ORAL_TABLET | Freq: Four times a day (QID) | ORAL | Status: DC | PRN
  Administered 2019-08-17: 650 mg via ORAL
  Filled 2019-08-17: qty 2

## 2019-08-17 MED ORDER — SODIUM CHLORIDE 0.9 % IV SOLN
2.0000 g | Freq: Two times a day (BID) | INTRAVENOUS | Status: DC
  Administered 2019-08-17 – 2019-08-18 (×2): 2 g via INTRAVENOUS
  Filled 2019-08-17 (×3): qty 2

## 2019-08-17 MED ORDER — METOCLOPRAMIDE HCL 5 MG/ML IJ SOLN
10.0000 mg | Freq: Once | INTRAMUSCULAR | Status: AC
  Administered 2019-08-17: 10 mg via INTRAVENOUS
  Filled 2019-08-17: qty 2

## 2019-08-17 MED ORDER — OXYCODONE HCL 5 MG PO TABS
5.0000 mg | ORAL_TABLET | Freq: Four times a day (QID) | ORAL | Status: DC | PRN
  Administered 2019-08-18 – 2019-08-27 (×7): 5 mg via ORAL
  Filled 2019-08-17 (×8): qty 1

## 2019-08-17 MED ORDER — DEXTROSE 50 % IV SOLN: 0.0000 mL | INTRAVENOUS | Status: DC | PRN

## 2019-08-17 MED ORDER — PANTOPRAZOLE SODIUM 40 MG PO TBEC
40.0000 mg | DELAYED_RELEASE_TABLET | Freq: Every day | ORAL | Status: DC
  Administered 2019-08-18: 40 mg via ORAL
  Filled 2019-08-17: qty 1

## 2019-08-17 MED ORDER — ACETAMINOPHEN 325 MG PO TABS
650.0000 mg | ORAL_TABLET | Freq: Once | ORAL | Status: AC
  Administered 2019-08-17: 650 mg via ORAL
  Filled 2019-08-17: qty 2

## 2019-08-17 MED ORDER — SALINE SPRAY 0.65 % NA SOLN
1.0000 | NASAL | Status: DC | PRN
  Filled 2019-08-17: qty 44

## 2019-08-17 MED ORDER — SODIUM CHLORIDE 0.9 % IV BOLUS
500.0000 mL | Freq: Once | INTRAVENOUS | Status: AC
  Administered 2019-08-17: 500 mL via INTRAVENOUS

## 2019-08-17 MED ORDER — METOPROLOL TARTRATE 5 MG/5ML IV SOLN
5.0000 mg | Freq: Four times a day (QID) | INTRAVENOUS | Status: DC | PRN
  Administered 2019-08-19: 5 mg via INTRAVENOUS
  Filled 2019-08-17: qty 5

## 2019-08-17 MED ORDER — GABAPENTIN 300 MG PO CAPS
300.0000 mg | ORAL_CAPSULE | Freq: Two times a day (BID) | ORAL | Status: DC
  Administered 2019-08-17 – 2019-08-23 (×12): 300 mg via ORAL
  Filled 2019-08-17 (×12): qty 1

## 2019-08-17 MED ORDER — SIMETHICONE 80 MG PO CHEW
80.0000 mg | CHEWABLE_TABLET | Freq: Four times a day (QID) | ORAL | Status: DC | PRN
  Administered 2019-08-19: 80 mg via ORAL
  Filled 2019-08-17: qty 1

## 2019-08-17 MED ORDER — VANCOMYCIN HCL 750 MG/150ML IV SOLN
750.0000 mg | INTRAVENOUS | Status: DC
  Filled 2019-08-17: qty 150

## 2019-08-17 NOTE — Progress Notes (Signed)
Pharmacy Antibiotic Note  Gina [name] is a 59 y.o. female admitted on 08/17/2019 with decreased appetite and fever.  Patient is also on chronic TPN for PCM.  Pharmacy has been consulted for cefepime dosing for sepsis/UTI.  She has an AKI - SCr up 1.4, CrCL 35 ml/min, Tmax 103.1, WBC WNL, LA 1.8.  Plan: - Cefepime 2gm IV Q12H - Add Vancomycin 1000 mg IV x 1 dose  - Followed by Vancomycin 750 mg q24h  - Monitor renal fxn, clinical progress  Height: 5\' 2"  (157.5 cm) Weight: 49.9 kg (110 lb) IBW/kg (Calculated) : 50.1  Temp (24hrs), Avg:101.4 F (38.6 C), Min:99.2 F (37.3 C), Max:103.1 F (39.5 C)  Recent Labs  Lab 08/17/19 0950 08/17/19 1312  WBC 4.8  --   CREATININE 1.40* 1.42*  LATICACIDVEN 1.8 2.6*    Estimated Creatinine Clearance: 34 mL/min (A) (by C-G formula based on SCr of 1.42 mg/dL (H)).    Allergies  Allergen Reactions  . Bee Venom Swelling    Swells up with bee stings   . Metformin And Related Other (See Comments)    Upset stomach    Cefepime 6/6 >>  6/6 covid -  6/6 UCx -  6/6 BCx -   Thuy D. Mina Marble, PharmD, BCPS, [place] 08/17/2019, 3:36 PM

## 2019-08-17 NOTE — Consult Note (Signed)
[facility] for Infectious Disease    Date of Admission:  08/17/2019           Day 1 cefepime       Reason for Consult: Fever and sepsis    Referring Provider: Dr. Wynetta Fines  Assessment: She has multiple potential sources for fever including her PICC line, open sacral wounds, UTI and recurrent intra-abdominal infection.  I cannot tell if her previous enterocutaneous fistula has healed or communicates with her colostomy.  I recommend adding vancomycin to cefepime pending culture results and further observation.  She may need a repeat abdominal and pelvic CT scan.  Plan: 1. Continue cefepime 2. Add vancomycin 3. Await culture results  Principal Problem:   Fever Active Problems:   Sepsis ([place])   HTN (hypertension)   Diabetes ([place])   Anemia of chronic disease   Schizophrenia ([place])   Acute kidney injury ([place])   Sacral decubitus ulcer   Normocytic anemia   S/P BKA (below knee amputation) bilateral (HCC)   Status post colostomy (HCC)   Enterocutaneous fistula   Scheduled Meds: . ascorbic acid  500 mg Oral BID  . enoxaparin (LOVENOX) injection  40 mg Subcutaneous Q24H  . escitalopram  20 mg Oral Daily  . [[name] ON 08/18/2019] ferrous sulfate  300 mg Oral Q breakfast  . gabapentin  300 mg Oral BID  . melatonin  3 mg Oral QHS  . pantoprazole  40 mg Oral Daily   Continuous Infusions: . sodium chloride 75 mL/hr at 08/17/19 1244  . sodium chloride    . albumin human 12.5 g (08/17/19 1452)  . ceFEPime (MAXIPIME) IV    . dextrose 5 % and 0.45% NaCl    . dextrose 5 % and 0.45% NaCl    . insulin     PRN Meds:.dextrose, dextrose, loperamide, metoprolol tartrate, ondansetron, oxyCODONE, simethicone, sodium chloride  HPI: Gina [name] is a 59 y.o. female with diabetes and schizophrenia who suffered a gastric perforation last November.  She underwent patch repair of the perforation.  She had diffuse peritonitis and Candida glabrata fungemia.  She had a very  difficult and prolonged course with intra-abdominal abscesses and was felt to have an enterocutaneous fistula.  She has undergone colostomy.  She has had a PICC for TPN ever since.  She was hospitalized again in March with MRSA bacteremia.  She was treated for a Proteus UTI in April.  She was admitted again today with a temperature of 103.1.  Her admission note indicates that she had had decreased appetite and nausea recently.  It also mentioned that "she also seemed to have burning sensation when urinated."  Apparently her daughter, who changes her sacral wound dressings, said that there had not been any changes in her wound recently.   Review of Systems: Review of Systems  Unable to perform ROS: Mental acuity    Past Medical History:  Diagnosis Date  . Diabetes mellitus   . Hypertension   . Osteomyelitis of right foot ([place]) 02/22/2017  . Perforated gastric ulcer ([place])   . S/P bilateral BKA (below knee amputation) ([place])   . Schizophrenia ([place])   . Schizophrenia ([place])   . Septic arthritis of interphalangeal joint of toe of left foot ([place]) 06/02/2016  . Stress incontinence 04/26/2017    Social History   Tobacco Use  . Smoking status: Never Smoker  . Smokeless tobacco: Never Used  Substance Use Topics  . Alcohol use: No  Comment: occasionally  . Drug use: No    Family History  Problem Relation Age of Onset  . Diabetes type II Father   . CAD Father   . Prostate cancer Father   . Diabetes Mellitus II Brother    Allergies  Allergen Reactions  . Bee Venom Swelling    Swells up with bee stings   . Metformin And Related Other (See Comments)    Upset stomach    OBJECTIVE: Blood pressure 110/65, pulse (!) 112, temperature 99.2 F (37.3 C), temperature source Oral, resp. rate 20, height 5\' 2"  (1.575 m), weight 49.9 kg, last menstrual period 03/29/2015.  Physical Exam Constitutional:      Comments: She is resting quietly on a stretcher in the emergency department.  No family  are present.  She opens eyes to voice but does not respond to questions.  She is cachectic  Cardiovascular:     Rate and Rhythm: Normal rate and regular rhythm.     Heart sounds: No murmur.  Pulmonary:     Effort: Pulmonary effort is normal.     Breath sounds: Normal breath sounds.  Abdominal:     Palpations: Abdomen is soft.     Tenderness: There is no abdominal tenderness.     Comments: She has a colostomy.  Skin:    Findings: No rash.     Comments: Her right arm PICC site appears normal.  She has excoriations on the lateral aspects of her bilateral BKA stumps.     Lab Results Lab Results  Component Value Date   WBC 4.8 08/17/2019   HGB 6.8 (LL) 08/17/2019   HCT 20.0 (L) 08/17/2019   MCV 92.8 08/17/2019   PLT 195 08/17/2019    Lab Results  Component Value Date   CREATININE 1.42 (H) 08/17/2019   BUN 70 (H) 08/17/2019   NA 131 (L) 08/17/2019   K 5.0 08/17/2019   CL 97 (L) 08/17/2019   CO2 20 (L) 08/17/2019    Lab Results  Component Value Date   ALT 20 08/17/2019   AST 20 08/17/2019   ALKPHOS 72 08/17/2019   BILITOT 0.6 08/17/2019     Microbiology: Recent Results (from the past 240 hour(s))  SARS Coronavirus 2 by RT PCR (hospital order, performed in [facility] hospital lab) Nasopharyngeal Nasopharyngeal Swab     Status: None   Collection Time: 08/17/19  8:55 AM   Specimen: Nasopharyngeal Swab  Result Value Ref Range Status   SARS Coronavirus 2 NEGATIVE NEGATIVE Final    Comment: (NOTE) SARS-CoV-2 target nucleic acids are NOT DETECTED. The SARS-CoV-2 RNA is generally detectable in upper and lower respiratory specimens during the acute phase of infection. The lowest concentration of SARS-CoV-2 viral copies this assay can detect is 250 copies / mL. A negative result does not preclude SARS-CoV-2 infection and should not be used as the sole basis for treatment or other patient management decisions.  A negative result may occur with improper specimen collection /  handling, submission of specimen other than nasopharyngeal swab, presence of viral mutation(s) within the areas targeted by this assay, and inadequate number of viral copies (<250 copies / mL). A negative result must be combined with clinical observations, patient history, and epidemiological information. Fact Sheet for Patients:   StrictlyIdeas.no Fact Sheet for Healthcare Providers: BankingDealers.co.za This test is not yet approved or cleared  by the [place] FDA and has been authorized for detection and/or diagnosis of SARS-CoV-2 by FDA under an Emergency Use Authorization (  EUA).  This EUA will remain in effect (meaning this test can be used) for the duration of the COVID-19 declaration under Section 564(b)(1) of the Act, 21 U.S.C. section 360bbb-3(b)(1), unless the authorization is terminated or revoked sooner. Performed at [facility], [place] [address], [place], [place] [address]     Michel Bickers, [place] [facility] [phone] pager   [phone] cell 08/17/2019, 2:55 PM

## 2019-08-17 NOTE — H&P (Signed)
History and Physical    Gina [name] VPX:[id number] DOB: May 16, 1960 DOA: 08/17/2019  PCP: [name], MD (Confirm with patient/family/NH records and if not entered, this has to be entered at [facility] point of entry) Patient coming from: Home  I have personally briefly reviewed patient's old medical records in [place]  Chief Complaint: Fever, dysuria  HPI: Gina [name] is a 59 y.o. female with medical history significant of perforated prepyloric ulcer in 2020 s/p exlap with [name] patch, chronic TPN via right arm PICC, IDDM, bilateral lower quadrant drains along with enterocutaneous fistula with colostomy bag, Chronic sacral pressure ulcers stage II, HTN, schizophrenia, bilateral BKA, PVD s/p B/L BKA,  sent from from home for evaluation of fever. She was admitted few times this year for sepsis, including MRSA bacteremia fungemia in March, Candida peritonitis in April and Proteus UTI in May.  Pt on TPN and she also take soft diet to improve her nutrition condition. Daughter noticed that pt has had decreased appetite for last 3-4 day and was warm to touch last night and complained about feeling of nausea but no vomiting. No abd pain. She also seemed to have burning sensation when urinated. Daughter reported that she does wound care everyday herself and did not see significant changes associated with the sacral wounds. Daughter also reported that pt Glucose has been fairly controlled at home seldom >200. ED Course: Cre 1.4, CrCL 35 ml/min, Tmax 103.1, WBC WNL, LA 1.8. UA showed WBC 11-20/hpf. Xray no infiltrates. GLU >700  Review of Systems: As per HPI otherwise 10 point review of systems negative.    Past Medical History:  Diagnosis Date  . Diabetes mellitus   . Hypertension   . Osteomyelitis of right foot ([place]) 02/22/2017  . Perforated gastric ulcer ([place])   . S/P bilateral BKA (below knee amputation) ([place])   . Schizophrenia ([place])   . Schizophrenia ([place])   . Septic arthritis of  interphalangeal joint of toe of left foot ([place]) 06/02/2016  . Stress incontinence 04/26/2017    Past Surgical History:  Procedure Laterality Date  . AMPUTATION Left 06/05/2016   Procedure: LEFT FOOT TRANSMETATARSAL AMPUTATION;  Surgeon: [name], MD;  Location: [facility];  Service: Orthopedics;  Laterality: Left;  . AMPUTATION Right 11/11/2017   Procedure: AMPUTATION BELOW KNEE;  Surgeon: [name], MD;  Location: [place];  Service: Orthopedics;  Laterality: Right;  . AMPUTATION Left 05/10/2018   Procedure: LEFT BELOW KNEE AMPUTATION;  Surgeon: [name], MD;  Location: [place];  Service: Orthopedics;  Laterality: Left;  . AMPUTATION Left 12/28/2018   Procedure: AMPUTATION THIRD DIGIT;  Surgeon: Iran Planas, MD;  Location: [facility];  Service: Orthopedics;  Laterality: Left;  . AMPUTATION TOE Right 02/23/2017   Procedure: AMPUTATION RIGHT THIRD TOE;  Surgeon: [name], MD;  Location: [place];  Service: Orthopedics;  Laterality: Right;  . ESOPHAGOGASTRODUODENOSCOPY (EGD) WITH PROPOFOL N/A 07/24/2019   Procedure: ESOPHAGOGASTRODUODENOSCOPY (EGD) WITH PROPOFOL;  Surgeon: Wilford Corner, MD;  [facility];  Service: Endoscopy;  Laterality: N/A;  . I & D EXTREMITY Right 11/09/2017   Procedure: Debride Ulcer Right Heel;  Surgeon: [name], MD;  Location: [place];  Service: Orthopedics;  Laterality: Right;  . I & D EXTREMITY Left 12/28/2018   Procedure: IRRIGATION AND DEBRIDEMENT EXTREMITY;  Surgeon: Iran Planas, MD;  Location: [facility];  Service: Orthopedics;  Laterality: Left;  . INCISION AND DRAINAGE OF WOUND Left 12/26/2018   Procedure: IRRIGATION AND  DEBRIDEMENT LEFT HAND;  Surgeon: Iran Planas, MD;  Location: [facility];  Service: Orthopedics;  Laterality: Left;  . LAPAROTOMY N/A 02/02/2019   Procedure: EXPLORATORY LAPAROTOMY WITH OVERSEW OF DUODENAL ULCER;  Surgeon: Alphonsa Overall, MD;  Location: [facility];  Service: General;  Laterality: N/A;  . TEE WITHOUT CARDIOVERSION N/A  05/27/2019   Procedure: TRANSESOPHAGEAL ECHOCARDIOGRAM (TEE);  Surgeon: Skeet Latch, MD;  Location: [place];  Service: Cardiovascular;  Laterality: N/A;  . TUBAL LIGATION       reports that she has never smoked. She has never used smokeless tobacco. She reports that she does not drink alcohol or use drugs.  Allergies  Allergen Reactions  . Bee Venom Swelling    Swells up with bee stings   . Metformin And Related Other (See Comments)    Upset stomach    Family History  Problem Relation Age of Onset  . Diabetes type II Father   . CAD Father   . Prostate cancer Father   . Diabetes Mellitus II Brother      Prior to Admission medications   Medication Sig Start Date End Date Taking? Authorizing Provider  amLODipine (NORVASC) 5 MG tablet Take 5 mg by mouth daily.   Yes [provider]  ascorbic acid (VITAMIN C) 500 MG tablet Take 1 tablet (500 mg total) by mouth 2 (two) times daily. 06/10/19  Yes Shelly Coss, MD  escitalopram (LEXAPRO) 20 MG tablet Take 20 mg by mouth daily.   Yes [provider]  gabapentin (NEURONTIN) 300 MG capsule Take 1 capsule (300 mg total) by mouth 2 (two) times daily. 06/10/19  Yes Shelly Coss, MD  loperamide (IMODIUM) 2 MG capsule Take 1 capsule (2 mg total) by mouth every 8 (eight) hours as needed for diarrhea or loose stools. 07/07/19  Yes Regalado, Belkys A, MD  melatonin 3 MG TABS tablet Take 1 tablet (3 mg total) by mouth at bedtime. 06/10/19  Yes Shelly Coss, MD  metoprolol tartrate (LOPRESSOR) 50 MG tablet Take 1 tablet (50 mg total) by mouth 2 (two) times daily. 06/10/19  Yes Shelly Coss, MD  Nutritional Supplements (ENSURE ENLIVE PO) Take 237 mLs by mouth 2 (two) times daily with a meal.   Yes [provider]  ondansetron (ZOFRAN) 4 MG tablet Take 1 tablet (4 mg total) by mouth every 6 (six) hours as needed for nausea. 06/10/19  Yes Shelly Coss, MD  oxyCODONE (OXY IR/ROXICODONE) 5 MG immediate release  tablet Take 1 tablet (5 mg total) by mouth every 6 (six) hours as needed (or pain). 06/10/19  Yes Shelly Coss, MD  pantoprazole (PROTONIX) 40 MG tablet Take 1 tablet (40 mg total) by mouth daily. 06/10/19  Yes Shelly Coss, MD  simethicone (MYLICON) 80 MG chewable tablet Chew 80 mg by mouth 4 (four) times daily as needed (indigestion).    Yes [provider]  sodium chloride (OCEAN) 0.65 % SOLN nasal spray Place 1 spray into both nostrils every 4 (four) hours as needed (dry nasal membranes).   Yes [provider]  ferrous sulfate 300 (60 Fe) MG/5ML syrup Take 5 mLs (300 mg total) by mouth daily with breakfast. 07/07/19   Regalado, Belkys A, MD  Magnesium 400 MG CAPS Take 400 mg by mouth 2 (two) times daily. 07/07/19   Elmarie Shiley, MD    Physical Exam: Vitals:   08/17/19 0858 08/17/19 0900  BP: 110/65   Pulse: (!) 112   Resp: 20   Temp: (!) 103.1 F (  39.5 C)   TempSrc: Oral   Weight:  49.9 kg  Height:  5\' 2"  (1.575 m)    Constitutional: NAD, calm, comfortable Vitals:   08/17/19 0858 08/17/19 0900  BP: 110/65   Pulse: (!) 112   Resp: 20   Temp: (!) 103.1 F (39.5 C)   TempSrc: Oral   Weight:  49.9 kg  Height:  5\' 2"  (1.575 m)   Eyes: PERRL, lids and conjunctivae normal ENMT: Mucous membranes are moist. Posterior pharynx clear of any exudate or lesions.Normal dentition.  Neck: normal, supple, no masses, no thyromegaly Respiratory: clear to auscultation bilaterally, no wheezing, no crackles. Normal respiratory effort. No accessory muscle use.  Cardiovascular: Regular rate and rhythm, no murmurs / rubs / gallops. No extremity edema. 2+ pedal pulses. No carotid bruits.  Abdomen: no tenderness, no masses palpated. No hepatosplenomegaly. Bowel sounds positive. Colostomy bag has soft yellow stool Musculoskeletal: no clubbing / cyanosis. No joint deformity upper and lower extremities. Good ROM, no contractures. Normal muscle tone. B/L BKA Skin: no rashes,  lesions, ulcers. No induration, Stage II sacral ulcers with clean base. No rash or tenderness around PICC site Neurologic: CN 2-12 grossly intact. Sensation intact, DTR normal. Strength 5/5 in all 4.  Psychiatric: Normal judgment and insight. Alert and oriented x 3. Normal mood.     Labs on Admission: I have personally reviewed following labs and imaging studies  CBC: Recent Labs  Lab 08/17/19 0950  WBC 4.8  NEUTROABS 4.1  HGB 7.5*  HCT 24.4*  MCV 92.8  PLT 027   Basic Metabolic Panel: Recent Labs  Lab 08/17/19 0950  NA 128*  K 5.3*  CL 96*  CO2 19*  GLUCOSE 746*  BUN 74*  CREATININE 1.40*  CALCIUM 8.6*   GFR: Estimated Creatinine Clearance: 34.5 mL/min (A) (by C-G formula based on SCr of 1.4 mg/dL (H)). Liver Function Tests: Recent Labs  Lab 08/17/19 0950  AST 20  ALT 20  ALKPHOS 72  BILITOT 0.6  PROT 7.8  ALBUMIN 1.9*   Recent Labs  Lab 08/17/19 0950  LIPASE 21   No results for input(s): AMMONIA in the last 168 hours. Coagulation Profile: Recent Labs  Lab 08/17/19 0950  INR 1.2   Cardiac Enzymes: No results for input(s): CKTOTAL, CKMB, CKMBINDEX, TROPONINI in the last 168 hours. BNP (last 3 results) No results for input(s): PROBNP in the last 8760 hours. HbA1C: No results for input(s): HGBA1C in the last 72 hours. CBG: Recent Labs  Lab 08/17/19 0848 08/17/19 1230  GLUCAP >600* 557*   Lipid Profile: No results for input(s): CHOL, HDL, LDLCALC, TRIG, CHOLHDL, LDLDIRECT in the last 72 hours. Thyroid Function Tests: No results for input(s): TSH, T4TOTAL, FREET4, T3FREE, THYROIDAB in the last 72 hours. Anemia Panel: No results for input(s): VITAMINB12, FOLATE, FERRITIN, TIBC, IRON, RETICCTPCT in the last 72 hours. Urine analysis:    Component Value Date/Time   COLORURINE YELLOW 08/17/2019 0858   APPEARANCEUR CLEAR 08/17/2019 0858   LABSPEC 1.015 08/17/2019 0858   PHURINE 8.0 08/17/2019 0858   GLUCOSEU >=500 (A) 08/17/2019 0858   HGBUR  LARGE (A) 08/17/2019 0858   BILIRUBINUR NEGATIVE 08/17/2019 0858   BILIRUBINUR N 04/28/2016 1603   KETONESUR NEGATIVE 08/17/2019 0858   PROTEINUR >=300 (A) 08/17/2019 0858   UROBILINOGEN 0.2 04/28/2016 1603   UROBILINOGEN 0.2 06/18/2013 0650   NITRITE NEGATIVE 08/17/2019 0858   LEUKOCYTESUR MODERATE (A) 08/17/2019 0858    Radiological Exams on Admission: [facility]  Result Date: 08/17/2019 CLINICAL DATA:  Fever of unknown origin. EXAM: PORTABLE CHEST 1 VIEW COMPARISON:  07/04/2019 FINDINGS: Cardiac silhouette is normal in size. No mediastinal or hilar masses. No evidence of adenopathy. Mild atelectasis in the medial lung bases similar to the prior study. Lungs otherwise clear. Lung volumes are low. No convincing pleural effusion and no pneumothorax. Right PICC is stable, catheter tip in the lower superior vena cava. Skeletal structures are grossly intact. IMPRESSION: No acute cardiopulmonary disease. Electronically Signed   By: Lajean Manes M.D.   On: 08/17/2019 09:42    EKG: Independently reviewed. Sinus tachy  Assessment/Plan Active Problems:   * No active hospital problems. *  Sepsis -Source considered to be UTI.  However patient complicated infections in recent months including MRSA bacteremia, Candida bacteremia and peritonitis, Proteus UTI, discussed with on-call infectious disease Dr. Megan Salon who will evaluate patient this afternoon and give further recommendation.  Continue cefepime for now. -Hold TPN and monitor PICC line  HHS -Probably related to sepsis, insulin drip and EndoTool  AKI -Received IV boluses 30 cc/kg, continue IV fluid  Stage II pressure ulcer, chronic POA -Consult Wound Care  Chronic Anemia iron deficiency and chronic illness related -Slight decrease compared to last month, consider transfusion if H&H drop below 7 -No significant signs of active bleeding  History of perforated gastric ulcer-status post [name] patch. - On  PPI  Hypoalbuminemia -Albumin infusion to 6 x 1 day  Severe protein calorie malnutrition -Hold TPN until sepsis resolves and or blood culture becomes negative  DVT prophylaxis: Lovenox Code Status: DNR Family Communication: Daughter over the phone Disposition Plan: Patient has complicated medical conditions, likely will need more than 2 midnight hospital stay to treat sepsis as well as HHS. Consults called: Infectious disease Admission status: PCU   Lequita Halt MD [facility] Pager [phone]   08/17/2019, 12:33 PM

## 2019-08-17 NOTE — ED Triage Notes (Signed)
PT from home with daughter . EMS reported Pt has Picc line in Rt ARM for TPN. Pt had decreased appetite since Tuesday. Fever starting last night. EMS reported  Temp. 102 -104.4.  CBG reported to be high on EMS glucometer . Pt did refused po tylenol due to N/V.   TPN is dosed for 12 hrs and off  10 hrs. The prescribed  Dosing is 7 A -7 P daughter reports  Not following those times.  Pt also has an [place] pouch for drainage from fistula bowle

## 2019-08-17 NOTE — ED Notes (Signed)
CBG 128 

## 2019-08-17 NOTE — ED Notes (Signed)
Only one blood culture drawn because of difficult stick This writer attempted x3 for blood draw. Lab tech drew blood

## 2019-08-17 NOTE — Progress Notes (Signed)
Pharmacy Antibiotic Note  Gina [name] is a 59 y.o. female admitted on 08/17/2019 with decreased appetite and fever.  Patient is also on chronic TPN for PCM.  Pharmacy has been consulted for cefepime dosing for sepsis/UTI.  She has an AKI - SCr up 1.4, CrCL 35 ml/min, Tmax 103.1, WBC WNL, LA 1.8.  Plan: Cefepime 2gm IV Q12H Monitor renal fxn, clinical progress  Height: 5\' 2"  (157.5 cm) Weight: 49.9 kg (110 lb) IBW/kg (Calculated) : 50.1  Temp (24hrs), Avg:103.1 F (39.5 C), Min:103.1 F (39.5 C), Max:103.1 F (39.5 C)  Recent Labs  Lab 08/17/19 0950  WBC 4.8  CREATININE 1.40*  LATICACIDVEN 1.8    Estimated Creatinine Clearance: 34.5 mL/min (A) (by C-G formula based on SCr of 1.4 mg/dL (H)).    Allergies  Allergen Reactions  . Bee Venom Swelling    Swells up with bee stings   . Metformin And Related Other (See Comments)    Upset stomach    Cefepime 6/6 >>  6/6 covid -  6/6 UCx -  6/6 BCx -   [employer] [employer] D. Mina Marble, PharmD, BCPS, [place] 08/17/2019, 11:22 AM
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
## 2019-08-18 ENCOUNTER — Inpatient Hospital Stay (HOSPITAL_COMMUNITY): Payer: Medicare HMO

## 2019-08-18 ENCOUNTER — Ambulatory Visit: Payer: Medicare Other | Admitting: Cardiology

## 2019-08-18 DIAGNOSIS — R509 Fever, unspecified: Secondary | ICD-10-CM

## 2019-08-18 DIAGNOSIS — M79606 Pain in leg, unspecified: Secondary | ICD-10-CM

## 2019-08-18 LAB — COMPREHENSIVE METABOLIC PANEL
ALT: 30 U/L (ref 0–44)
AST: 39 U/L (ref 15–41)
Albumin: 1.5 g/dL — ABNORMAL LOW (ref 3.5–5.0)
Alkaline Phosphatase: 51 U/L (ref 38–126)
Anion gap: 5 (ref 5–15)
BUN: 63 mg/dL — ABNORMAL HIGH (ref 6–20)
CO2: 22 mmol/L (ref 22–32)
Calcium: 8 mg/dL — ABNORMAL LOW (ref 8.9–10.3)
Chloride: 105 mmol/L (ref 98–111)
Creatinine, Ser: 1.22 mg/dL — ABNORMAL HIGH (ref 0.44–1.00)
GFR calc Af Amer: 57 mL/min — ABNORMAL LOW (ref >60)
GFR calc non Af Amer: 49 mL/min — ABNORMAL LOW (ref >60)
Glucose, Bld: 215 mg/dL — ABNORMAL HIGH (ref 70–99)
Potassium: 4 mmol/L (ref 3.5–5.1)
Sodium: 132 mmol/L — ABNORMAL LOW (ref 135–145)
Total Bilirubin: 0.6 mg/dL (ref 0.3–1.2)
Total Protein: 5.6 g/dL — ABNORMAL LOW (ref 6.5–8.1)

## 2019-08-18 LAB — PHOSPHORUS: Phosphorus: 3.4 mg/dL (ref 2.5–4.6)

## 2019-08-18 LAB — BLOOD CULTURE ID PANEL (REFLEXED)
Acinetobacter baumannii: NOT DETECTED
Candida albicans: NOT DETECTED
Candida glabrata: DETECTED — AB
Candida krusei: NOT DETECTED
Candida parapsilosis: NOT DETECTED
Candida tropicalis: NOT DETECTED
Enterobacter cloacae complex: NOT DETECTED
Enterobacteriaceae species: NOT DETECTED
Enterococcus species: NOT DETECTED
Escherichia coli: NOT DETECTED
Haemophilus influenzae: NOT DETECTED
Klebsiella oxytoca: NOT DETECTED
Klebsiella pneumoniae: NOT DETECTED
Listeria monocytogenes: NOT DETECTED
Neisseria meningitidis: NOT DETECTED
Proteus species: NOT DETECTED
Pseudomonas aeruginosa: NOT DETECTED
Serratia marcescens: NOT DETECTED
Staphylococcus aureus (BCID): NOT DETECTED
Staphylococcus species: NOT DETECTED
Streptococcus agalactiae: NOT DETECTED
Streptococcus pneumoniae: NOT DETECTED
Streptococcus pyogenes: NOT DETECTED
Streptococcus species: NOT DETECTED

## 2019-08-18 LAB — CBC WITH DIFFERENTIAL/PLATELET
Abs Immature Granulocytes: 0.01 10*3/uL (ref 0.00–0.07)
Basophils Absolute: 0 10*3/uL (ref 0.0–0.1)
Basophils Relative: 0 %
Eosinophils Absolute: 0 10*3/uL (ref 0.0–0.5)
Eosinophils Relative: 0 %
HCT: 17.6 % — ABNORMAL LOW (ref 36.0–46.0)
Hemoglobin: 5.6 g/dL — CL (ref 12.0–15.0)
Immature Granulocytes: 0 %
Lymphocytes Relative: 13 %
Lymphs Abs: 0.7 10*3/uL (ref 0.7–4.0)
MCH: 28.7 pg (ref 26.0–34.0)
MCHC: 31.8 g/dL (ref 30.0–36.0)
MCV: 90.3 fL (ref 80.0–100.0)
Monocytes Absolute: 0.5 10*3/uL (ref 0.1–1.0)
Monocytes Relative: 10 %
Neutro Abs: 3.8 10*3/uL (ref 1.7–7.7)
Neutrophils Relative %: 77 %
Platelets: 141 10*3/uL — ABNORMAL LOW (ref 150–400)
RBC: 1.95 MIL/uL — ABNORMAL LOW (ref 3.87–5.11)
RDW: 16.7 % — ABNORMAL HIGH (ref 11.5–15.5)
WBC Morphology: INCREASED
WBC: 5 10*3/uL (ref 4.0–10.5)
nRBC: 0 % (ref 0.0–0.2)

## 2019-08-18 LAB — IRON AND TIBC: Iron: 10 ug/dL — ABNORMAL LOW (ref 28–170)

## 2019-08-18 LAB — FERRITIN: Ferritin: 1228 ng/mL — ABNORMAL HIGH (ref 11–307)

## 2019-08-18 LAB — TRIGLYCERIDES: Triglycerides: 133 mg/dL (ref <150)

## 2019-08-18 LAB — GLUCOSE, CAPILLARY
Glucose-Capillary: 211 mg/dL — ABNORMAL HIGH (ref 70–99)
Glucose-Capillary: 182 mg/dL — ABNORMAL HIGH (ref 70–99)
Glucose-Capillary: 184 mg/dL — ABNORMAL HIGH (ref 70–99)
Glucose-Capillary: 59 mg/dL — ABNORMAL LOW (ref 70–99)
Glucose-Capillary: 73 mg/dL (ref 70–99)
Glucose-Capillary: 69 mg/dL — ABNORMAL LOW (ref 70–99)

## 2019-08-18 LAB — ECHOCARDIOGRAM COMPLETE
Height: 62 in
Weight: 1760 oz

## 2019-08-18 LAB — HEMOGLOBIN A1C
Hgb A1c MFr Bld: 7 % — ABNORMAL HIGH (ref 4.8–5.6)
Mean Plasma Glucose: 154.2 mg/dL

## 2019-08-18 LAB — PREALBUMIN: Prealbumin: 5.1 mg/dL — ABNORMAL LOW (ref 18–38)

## 2019-08-18 LAB — HEMOGLOBIN AND HEMATOCRIT, BLOOD
HCT: 30.5 % — ABNORMAL LOW (ref 36.0–46.0)
Hemoglobin: 10.2 g/dL — ABNORMAL LOW (ref 12.0–15.0)

## 2019-08-18 LAB — FOLATE: Folate: 26.5 ng/mL (ref >5.9)

## 2019-08-18 LAB — MAGNESIUM: Magnesium: 1.8 mg/dL (ref 1.7–2.4)

## 2019-08-18 LAB — VITAMIN B12: Vitamin B-12: 402 pg/mL (ref 180–914)

## 2019-08-18 LAB — PREPARE RBC (CROSSMATCH)

## 2019-08-18 LAB — OCCULT BLOOD X 1 CARD TO LAB, STOOL: Fecal Occult Bld: POSITIVE — AB

## 2019-08-18 MED ORDER — SODIUM CHLORIDE 0.9% IV SOLUTION: Freq: Once | INTRAVENOUS | Status: AC

## 2019-08-18 MED ORDER — IOHEXOL 9 MG/ML PO SOLN
500.0000 mL | ORAL | Status: AC
  Administered 2019-08-18: 500 mL via ORAL

## 2019-08-18 MED ORDER — ENSURE ENLIVE PO LIQD
237.0000 mL | Freq: Three times a day (TID) | ORAL | Status: DC
  Administered 2019-08-19 – 2019-08-21 (×4): 237 mL via ORAL

## 2019-08-18 MED ORDER — SODIUM CHLORIDE 0.9 % IV SOLN
100.0000 mg | INTRAVENOUS | Status: DC
  Administered 2019-08-19 – 2019-08-29 (×11): 100 mg via INTRAVENOUS
  Filled 2019-08-18 (×11): qty 100

## 2019-08-18 MED ORDER — INSULIN ASPART 100 UNIT/ML ~~LOC~~ SOLN
0.0000 [IU] | SUBCUTANEOUS | Status: DC
  Administered 2019-08-18 (×2): 2 [IU] via SUBCUTANEOUS

## 2019-08-18 MED ORDER — SODIUM CHLORIDE 0.9 % IV SOLN
200.0000 mg | Freq: Once | INTRAVENOUS | Status: AC
  Administered 2019-08-18: 200 mg via INTRAVENOUS
  Filled 2019-08-18: qty 200

## 2019-08-18 MED ORDER — IOHEXOL 300 MG/ML  SOLN
100.0000 mL | Freq: Once | INTRAMUSCULAR | Status: AC | PRN
  Administered 2019-08-18: 100 mL via INTRAVENOUS

## 2019-08-18 MED ORDER — PANTOPRAZOLE SODIUM 40 MG PO TBEC
40.0000 mg | DELAYED_RELEASE_TABLET | Freq: Two times a day (BID) | ORAL | Status: DC
  Administered 2019-08-18 – 2019-08-29 (×23): 40 mg via ORAL
  Filled 2019-08-18 (×23): qty 1

## 2019-08-18 MED ORDER — SODIUM CHLORIDE 0.9 % IV SOLN: 100.0000 mg | INTRAVENOUS | Status: DC

## 2019-08-18 MED ORDER — SODIUM CHLORIDE 0.9 % IV SOLN: 200.0000 mg | Freq: Once | INTRAVENOUS | Status: DC

## 2019-08-18 MED ORDER — LACTATED RINGERS IV SOLN: INTRAVENOUS | Status: AC

## 2019-08-18 MED ORDER — SODIUM CHLORIDE 0.9 % IV BOLUS
500.0000 mL | Freq: Once | INTRAVENOUS | Status: AC
  Administered 2019-08-18: 500 mL via INTRAVENOUS

## 2019-08-18 MED ORDER — TRAVASOL 10 % IV SOLN
INTRAVENOUS | Status: DC
  Filled 2019-08-18: qty 705.6

## 2019-08-18 NOTE — Progress Notes (Signed)
[facility] for Infectious Disease   Reason for visit: Follow up on fever  Interval History: no fever overnight, WBC wnl.  No new complaints.  Has continued leg pain. No chillls.  No rash.   Physical Exam: Constitutional:  Vitals:   08/18/19 0800 08/18/19 0806  BP: 120/90 117/70  Pulse:    Resp: 13 10  Temp: (!) 97.4 F (36.3 C)   SpO2:     patient appears in NAD Respiratory: Normal respiratory effort; CTA B Cardiovascular: RRR GI: soft, nt, nd; +ostomy MS: bilateral BKA  Review of Systems: Constitutional: negative for fevers, chills and anorexia Respiratory: negative for cough or sputum Genitourinary: negative for frequency and dysuria Integument/breast: negative for rash  Lab Results  Component Value Date   WBC 5.0 08/18/2019   HGB 5.6 (LL) 08/18/2019   HCT 17.6 (L) 08/18/2019   MCV 90.3 08/18/2019   PLT 141 (L) 08/18/2019    Lab Results  Component Value Date   CREATININE 1.22 (H) 08/18/2019   BUN 63 (H) 08/18/2019   NA 132 (L) 08/18/2019   K 4.0 08/18/2019   CL 105 08/18/2019   CO2 22 08/18/2019    Lab Results  Component Value Date   ALT 30 08/18/2019   AST 39 08/18/2019   ALKPHOS 51 08/18/2019     Microbiology: Recent Results (from the past 240 hour(s))  SARS Coronavirus 2 by RT PCR (hospital order, performed in [facility] hospital lab) Nasopharyngeal Nasopharyngeal Swab     Status: None   Collection Time: 08/17/19  8:55 AM   Specimen: Nasopharyngeal Swab  Result Value Ref Range Status   SARS Coronavirus 2 NEGATIVE NEGATIVE Final    Comment: (NOTE) SARS-CoV-2 target nucleic acids are NOT DETECTED. The SARS-CoV-2 RNA is generally detectable in upper and lower respiratory specimens during the acute phase of infection. The lowest concentration of SARS-CoV-2 viral copies this assay can detect is 250 copies / mL. A negative result does not preclude SARS-CoV-2 infection and should not be used as the sole basis for treatment or other patient  management decisions.  A negative result may occur with improper specimen collection / handling, submission of specimen other than nasopharyngeal swab, presence of viral mutation(s) within the areas targeted by this assay, and inadequate number of viral copies (<250 copies / mL). A negative result must be combined with clinical observations, patient history, and epidemiological information. Fact Sheet for Patients:   StrictlyIdeas.no Fact Sheet for Healthcare Providers: BankingDealers.co.za This test is not yet approved or cleared  by the [place] FDA and has been authorized for detection and/or diagnosis of SARS-CoV-2 by FDA under an Emergency Use Authorization (EUA).  This EUA will remain in effect (meaning this test can be used) for the duration of the COVID-19 declaration under Section 564(b)(1) of the Act, 21 U.S.C. section 360bbb-3(b)(1), unless the authorization is terminated or revoked sooner. Performed at [facility], [place] [address] [place], [place] [address]   Blood Culture (routine x 2)     Status: None (Preliminary result)   Collection Time: 08/17/19  9:50 AM   Specimen: BLOOD LEFT ARM  Result Value Ref Range Status   Specimen Description BLOOD LEFT ARM  Final   Special Requests   Final    BOTTLES DRAWN AEROBIC AND ANAEROBIC Blood Culture adequate volume   Culture   Final    NO GROWTH < 24 HOURS Performed at [facility], [place] [address] [place], [place] [address]  Report Status PENDING  Incomplete  Blood Culture (routine x 2)     Status: None (Preliminary result)   Collection Time: 08/17/19  1:12 PM   Specimen: BLOOD LEFT ARM  Result Value Ref Range Status   Specimen Description BLOOD LEFT ARM  Final   Special Requests AEROBIC BOTTLE ONLY Blood Culture adequate volume  Final   Culture   Final    NO GROWTH < 24 HOURS Performed at [facility], [place] [address], [place], [place] [address]     Report Status PENDING  Incomplete  MRSA PCR Screening     Status: Abnormal   Collection Time: 08/17/19  8:23 PM   Specimen: Nasal Mucosa; Nasopharyngeal  Result Value Ref Range Status   MRSA by PCR POSITIVE (A) NEGATIVE Final    Comment:        The GeneXpert MRSA Assay (FDA approved for NASAL specimens only), is one component of a comprehensive MRSA colonization surveillance program. It is not intended to diagnose MRSA infection nor to guide or monitor treatment for MRSA infections. RESULT CALLED TO, READ BACK BY AND VERIFIED WITH: J. MILLER,CHARGE RN 2423 08/17/2019 Mena Goes Performed at [facility], [place] [address] [place], [place] [address]     Impression/Plan:  1. Fever - unclear source.  Fever curve down overnight.  No new complaints.  On empiric antibiotics and will continue.  Potential source includes picc line, UTI.  UA with very high glucose so unclear if there is infection.  Also with open sacral wounds but looks ok per WOC note.  No abdominal pain or tenderness on exam.   2.  Renal insufficiency - up from her baseline but improving.

## 2019-08-18 NOTE — Progress Notes (Signed)
PHARMACY - PHYSICIAN COMMUNICATION CRITICAL VALUE ALERT - BLOOD CULTURE IDENTIFICATION (BCID)  Gina [name] is an 59 y.o. female who presented to [facility] on 08/17/2019 with a chief complaint of fever and dysuria  Assessment: Candida glabrata identified from   Name of physician (or Provider) Contacted: Dr. Florene Glen and ID team  Current antibiotics: Vancomycin, cefepime and anidulafungin added  Changes to prescribed antibiotics recommended: continue current antibiotics for now   Results for orders placed or performed during the hospital encounter of 08/17/19  Blood Culture ID Panel (Reflexed) (Collected: 08/17/2019  9:50 AM)  Result Value Ref Range   Enterococcus species NOT DETECTED NOT DETECTED   Listeria monocytogenes NOT DETECTED NOT DETECTED   Staphylococcus species NOT DETECTED NOT DETECTED   Staphylococcus aureus (BCID) NOT DETECTED NOT DETECTED   Streptococcus species NOT DETECTED NOT DETECTED   Streptococcus agalactiae NOT DETECTED NOT DETECTED   Streptococcus pneumoniae NOT DETECTED NOT DETECTED   Streptococcus pyogenes NOT DETECTED NOT DETECTED   Acinetobacter baumannii NOT DETECTED NOT DETECTED   Enterobacteriaceae species NOT DETECTED NOT DETECTED   Enterobacter cloacae complex NOT DETECTED NOT DETECTED   Escherichia coli NOT DETECTED NOT DETECTED   Klebsiella oxytoca NOT DETECTED NOT DETECTED   Klebsiella pneumoniae NOT DETECTED NOT DETECTED   Proteus species NOT DETECTED NOT DETECTED   Serratia marcescens NOT DETECTED NOT DETECTED   Haemophilus influenzae NOT DETECTED NOT DETECTED   Neisseria meningitidis NOT DETECTED NOT DETECTED   Pseudomonas aeruginosa NOT DETECTED NOT DETECTED   Candida albicans NOT DETECTED NOT DETECTED   Candida glabrata DETECTED (A) NOT DETECTED   Candida krusei NOT DETECTED NOT DETECTED   Candida parapsilosis NOT DETECTED NOT DETECTED   Candida tropicalis NOT DETECTED NOT DETECTED    Candie Mile 08/18/2019  12:29 PM

## 2019-08-18 NOTE — Consult Note (Addendum)
[facility] Nurse Consult Note: Patient receiving care in [facility].  Assisted with turning by primary RN, Vinnie Level. Reason for Consult: bilateral stump wound Wound type: BLE stumps have partial thickness wounds as follows: Right:  0.7 x 0.7 dried scab. Left lateral stump:  2.2 cm x 2.5 cm x no measureable depth. 100% pink. Left stump tip: 2 cm x 1 cm dried scab. Left stump: 2.5 cm x 1.5 cm, no depth, 100% pink  Foam dressing for all of these is appropriate. Pressure Injury POA: Yes Measurement: sacrum: 5.5 cm x 1.5 cm x no measureable depth; healing stage 4 PI Wound bed: 100% pink, clean Drainage (amount, consistency, odor) none Periwound: intact Dressing procedure/placement/frequency: Criticaid clear and foam dressing to sacral wound. Change every 3 days and prn.  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  There is a small Eakin pouch to the area that has thin brown effluent.  No s/s of impending leakage.  This was connected to a bedside drain bag.  Use a small Eakin pouch Kellie Simmering [phone]) for containment. Change prn. Monitor the wound area(s) for worsening of condition such as: Signs/symptoms of infection,  Increase in size,  Development of or worsening of odor, Development of pain, or increased pain at the affected locations.  Notify the medical team if any of these develop.  Thank you for the consult.  Discussed plan of care with the patient and bedside nurse.  [facility] nurse will not follow at this time.  Please re-consult the [facility] team if needed.  Val Riles, RN, MSN, CWOCN, CNS-BC, pager [phone]

## 2019-08-18 NOTE — Plan of Care (Signed)

## 2019-08-18 NOTE — Progress Notes (Addendum)
.  PHARMACY - TOTAL PARENTERAL NUTRITION CONSULT NOTE   Indication: Fistula  Patient Measurements: Height: 5\' 2"  (157.5 cm) Weight: 49.9 kg (110 lb) IBW/kg (Calculated) : 50.1 TPN AdjBW (KG): 49.9 Body mass index is 20.12 kg/m. Usual Weight: 53-60 kg  Assessment: 59 yo female presents on 08/17/2019 with fever, dysuria, and decreased intake. On presentation, patient found to have blood glucose of > 600 and started on insulin gtt. Prior to admission patient is on chronic cyclic TPN via R arm PICC and soft diet. TPN was initiated in 02/2019 after perforated prepyloric ulcer which was repaired with a [name] patch and continued to leak. Patient currently has an Chief Strategy Officer in place from January 2021 due to [facility] fistula.   Per patient, decreased appetite and po intake for previous 3-4 days prior to presentation. She reports transitioning from eat a normal diet of foods such as grits, chicken, tuna, chicken noodle soup, chips, jello, and pudding to full liquid diet containing mainly soup. Patient reports outpatient 12 hour cyclic TPN from 7am to 7pm and is handled by [facility].   Patient currently on vancomycin/cefepime and cultures have been obtained. Of note, multiple possible sources of infection include PICC line, open sacral wounds, UTI (presented with dysuria), or intra-abdominal infection. Patient with history of MRSA bacteremia in 05/2019.  Glucose / Insulin: Hx of DM (controlled by diet outpt). Hyperglycemia on admission started on insulin gtt. CBGs 179-270s now off insulin gtt and transitioned to SSI.  Electrolytes: Na 132. Corrected Ca 10. Mg 1.8 - borderline low. K decreased to 4.0. Other electrolytes wnl.  Renal: Scr 1.22, baseline Scr 0.6-0.8. No UOP documented. LFTs / TGs: AST/ALT wnl. Tbili wnl. TG 133. Prealbumin / albumin: prealbumin 5.1, albumin 1.5. Intake / Output; MIVF: Off D5 1/2 NS @75  mL/hr. GI Imaging:  Surgeries / Procedures:   Central access: R PICC TPN start date:  Chronic TPN prior to admission since 02/2019  Nutritional Goals (pending RD recommendations): kCal: 1300 kcal, Protein: 70g, Fluid: 1541mL Goal TPN rate is 60 mL/hr (provides 70.5g of protein and 1306 kcals per day)  Current Nutrition:  NPO  Starting TPN at 1800 on 6/7   Plan:  Start 24 hour TPN at 96mL/hr at 1800 given patient came in with significant hyperglycemia and likely transition to cyclic TPN tomorrow. Electrolytes in TPN: 134mEq/L of Na, 36mEq/L of K, 31mEq/L of Ca, 9mEq/L of Mg, and 65mmol/L of Phos. Cl:Ac ratio 1:1 Add standard MVI and trace elements to TPN Starting Sensitive q4h SSI and adjust as needed  Monitor TPN labs on Mon/Thurs Follow up [facility] outpatient TPN order (requested from [facility] on 6/7)   Cristela Felt, PharmD PGY1 Pharmacy Resident [employer]: [phone]  08/18/2019,6:50 AM   Addendum: Blood culture obtained on 6/6 now 2/3 budding yeast with BCID pending. Spoke with Dr. Florene Glen and will discontinue TPN which was ordered to start today.   Plan: - Discontinue TPN order planned to start at 1800  - Follow up blood culture and plans for TPN

## 2019-08-18 NOTE — Progress Notes (Signed)
Initial Nutrition Assessment  DOCUMENTATION CODES:   Non-severe (moderate) malnutrition in context of chronic illness  INTERVENTION:   - Ensure Enlive po TID, each supplement provides 350 kcal and 20 grams of protein  - Once able to restart, TPN per pharmacy  NUTRITION DIAGNOSIS:   Moderate Malnutrition related to chronic illness (EC fistula, PVD, perforated prepyloric ulcer s/p ex-lap on chronic cyclic TPN) as evidenced by severe fat depletion, moderate muscle depletion.  GOAL:   Patient will meet greater than or equal to 90% of their needs  MONITOR:   PO intake, Supplement acceptance, Labs, Weight trends, Skin, I & O's  REASON FOR ASSESSMENT:   Malnutrition Screening Tool    ASSESSMENT:   59 year old female who presented to the ED on 6/06 with fever, dysuria. PMH of perforated prepyloric ulcer in 2020 s/p ex-lap with [name] patch, chronic cyclic TPN via right arm PICC, T1DM, bilateral lower quadrant drains along with enterocutaneous fistula with Eakin pouch, chronic stage II sacral pressure injuries, HTN, schizophrenia, PVD s/p bilateral BKA. Pt admitted with sepsis secondary to fungemia, source unclear at this time.   TPN currently on hold as blood culture obtained on 6/6 now 2/3 budding yeast with BCID pending. Pt currently on a soft diet.  Spoke with pt at bedside. Pt eating from lunch tray at time of RD visit. Pt had completed ~25% of meal. Pt reports that her stomach hurts. RN aware.  Pt states that she follows a liquid diet at home. Pt is a poor historian but reports to RD that the last time that she ate solid food was back in April of this year. Pt reports that she has lost a lot of weight from following a full liquid diet. Pt unable to tell RD what her UBW is.  Reviewed weight history in chart. Pt with an 11.1 kg weight loss since 07/21/19. This is an 18.2% weight loss in 1 month which is severe and significant for timeframe. However, weight on admission appears stated  rather than measured so unsure of accuracy.  Medications reviewed and include: vitamin C, ferrous sulfate, SSI q 4 hours, protonix, IV abx IVF: LR @ 75 ml/hr  Labs reviewed: sodium 132, hemoglobin 5.6, prealbumin 5.1 CBG's: 128-271 x 24 hours  NUTRITION - FOCUSED PHYSICAL EXAM:    Most Recent Value  Orbital Region  Moderate depletion  Upper Arm Region  Severe depletion  Thoracic and Lumbar Region  Severe depletion  Buccal Region  Moderate depletion  Temple Region  Moderate depletion  Clavicle Bone Region  Moderate depletion  Clavicle and Acromion Bone Region  Moderate depletion  Scapular Bone Region  Moderate depletion  Dorsal Hand  Moderate depletion  Patellar Region  Unable to assess  Anterior Thigh Region  Severe depletion  Posterior Calf Region  Unable to assess  Edema (RD Assessment)  None  Hair  Reviewed  Eyes  Reviewed  Mouth  Reviewed  Skin  Reviewed  Nails  Reviewed       Diet Order:   Diet Order            DIET SOFT Room service appropriate? Yes; Fluid consistency: Thin  Diet effective now              EDUCATION NEEDS:   Not appropriate for education at this time  Skin:  Skin Assessment: Skin Integrity Issues: Stage II: bilateral coccyx  Last BM:  08/17/19  Height:   Ht Readings from Last 1 Encounters:  08/17/19 5\' 2"  (1.575 m)  Weight:   Wt Readings from Last 1 Encounters:  08/17/19 49.9 kg    Ideal Body Weight:  43.5 kg (adjusted for bilateral BKA)  BMI:  Body mass index is 20.12 kg/m.  Estimated Nutritional Needs:   Kcal:  1500-1700  Protein:  65-80 grams  Fluid:  >/= 1.5 L    Gaynell Face, MS, RD, LDN Inpatient Clinical Dietitian Pager: [phone] Weekend/After Hours: [phone]

## 2019-08-18 NOTE — Progress Notes (Signed)
I phoned CT radiology earlier this afternoon, advised that the Pt would not drink her Contrast

## 2019-08-18 NOTE — Progress Notes (Signed)
Echocardiogram 2D Echocardiogram has been performed.  [place] Deputy Lamekia Nolden 08/18/2019, 12:26 PM

## 2019-08-18 NOTE — Progress Notes (Signed)
Inpatient Diabetes Program Recommendations  AACE/ADA: New Consensus Statement on Inpatient Glycemic Control (2015)  Target Ranges:  Prepandial:   less than 140 mg/dL      Peak postprandial:   less than 180 mg/dL (1-2 hours)      Critically ill patients:  140 - 180 mg/dL   Lab Results  Component Value Date   GLUCAP 182 (H) 08/18/2019   HGBA1C 6.1 (H) 06/28/2019    Review of Glycemic Control Results for Gina [name], Gina [name] (MRN [id number]) as of 08/18/2019 10:07  Ref. Range 08/17/2019 08:48 08/17/2019 12:30 08/17/2019 14:41 08/17/2019 17:15 08/17/2019 18:33 08/17/2019 21:05 08/18/2019 05:45 08/18/2019 09:22  Glucose-Capillary Latest Ref Range: 70 - 99 mg/dL >600 (HH) 557 (HH) 430 (H) 165 (H) 128 (H) 271 (H) 211 (H) 182 (H)   Diabetes history: DM1 Outpatient Diabetes medications: None Current orders for Inpatient glycemic control: Novolog  Sensitive correction q 4 hrs.  Inpatient Diabetes Program Recommendations:   Patient has been reviewed by DM coordinators in the past and insulin was discontinued on discharge due to hypoglycemia. Patient admitted with blood glucose >600. Noted IV insulin via endotool discontinued. Will review CBG @ noon and list recommendations for basal insulin. Levemir 4-7 units bid. Sent secure chat to Dr. Florene Glen with update.  Thank you, Nani Gasser. Ethelwyn Gilbertson, RN, MSN, CDE  Diabetes Coordinator Inpatient Glycemic Control Team Team Pager [phone] (8am-5pm) 08/18/2019 10:23 AM

## 2019-08-18 NOTE — Progress Notes (Signed)
Chaplain engaged in initial visit with Gina [name] and offered support and prayer over her.  Chaplain will follow-up.

## 2019-08-18 NOTE — Progress Notes (Addendum)
PROGRESS NOTE    Gina [name]  HWE:[id number] DOB: 07-Sep-1960 DOA: 08/17/2019 PCP: [name], MD   Chief Complaint  Patient presents with  . Fever    Brief Narrative:  Gina [name] is Gina [name] 59 y.o. female with medical history significant of perforated prepyloric ulcer in 2020 s/p exlap with [name] patch, chronic TPN via right arm PICC, IDDM, bilateral lower quadrant drains along with enterocutaneous fistula with colostomy bag, Chronic sacral pressure ulcers stage II, HTN, schizophrenia, bilateral BKA, PVD s/p B/L BKA,  sent from from home for evaluation of fever. She was admitted few times this year for sepsis, including MRSA bacteremia fungemia in March, Candida peritonitis in April and Proteus UTI in May.  Pt on TPN and she also take soft diet to improve her nutrition condition. Daughter noticed that pt has had decreased appetite for last 3-4 day and was warm to touch last night and complained about feeling of nausea but no vomiting. No abd pain. She also seemed to have burning sensation when urinated. Daughter reported that she does wound care everyday herself and did not see significant changes associated with the sacral wounds. Daughter also reported that pt Glucose has been fairly controlled at home seldom >200. ED Course: Cre 1.4, CrCL 35 ml/min, Tmax 103.1, WBC WNL, LA 1.8. UA showed WBC 11-20/hpf. Xray no infiltrates. GLU >700  Assessment & Plan:   Principal Problem:   Fever Active Problems:   HTN (hypertension)   Diabetes (HCC)   Anemia of chronic disease   Sepsis (HCC)   Schizophrenia ([place])   Acute kidney injury ([place])   Sacral decubitus ulcer   Normocytic anemia   S/P BKA (below knee amputation) bilateral (HCC)   Status post colostomy (HCC)   Enterocutaneous fistula  Sepsis 2/2 Fungemia - fever, normal WBC, tachycardia, normal RR - source unclear at this time - initially thought to be UTI, but now with budding yeast in 1/2 blood cultures, but hx of MRSA bacteremia,  candida bacteremia, peritonitis, proteus UTI in past  - pt on TPN and with picc line - Appreciate ID assistance   - consider CT abdomen/pelvis  - continue vanc/cefepime - awaiting BCID per Dr. Linus Salmons to determine whether fluconazole can be used - follow blood and urine cultures - 1/2 blood cx currently with budding yeast - establish PIV and remove PICC - will need repeat blood cx   HHS  T2DM -SSI q4 hr, appreciate diabetes coordinator assistance with basal insulin  Acute on Chronic Anemia iron deficiency and chronic illness related - likely dilutional drop this morning, no signs bleeding - transfuse 2 units pRBC - follow iron, b12, folate, ferritin - addendum hemoccult positive, but brown stool - consider GI c/s if recurrent drop in Hb after transfusion  AKI -improving, baseline creatinine <1 -continue IVF  Stage II pressure ulcer, chronic POA -Consult Wound Care  Pressure Injury 05/22/19 Coccyx Bilateral Stage 2 -  Partial thickness loss of dermis presenting as Gina Mcintire shallow open injury with Gina [name] red, pink wound bed without slough. (Active)  05/22/19 2104  Location: Coccyx  Location Orientation: Bilateral  Staging: Stage 2 -  Partial thickness loss of dermis presenting as Gina [name] shallow open injury with Gina [name] red, pink wound bed without slough.  Wound Description (Comments):   Present on Admission: Yes     Pressure Injury 05/22/19 Knee Right;Distal Unstageable - Full thickness tissue loss in which the base of the injury is covered by slough (yellow, tan, gray, green or brown) and/or eschar (  tan, brown or black) in the wound bed. Stump (Active)  05/22/19 2106  Location: Knee  Location Orientation: Right;Distal  Staging: Unstageable - Full thickness tissue loss in which the base of the injury is covered by slough (yellow, tan, gray, green or brown) and/or eschar (tan, brown or black) in the wound bed.  Wound Description (Comments): Stump  Present on Admission: Yes     Pressure Injury  05/22/19 Knee Left;Distal Unstageable - Full thickness tissue loss in which the base of the injury is covered by slough (yellow, tan, gray, green or brown) and/or eschar (tan, brown or black) in the wound bed. Stump (Active)  05/22/19 2108  Location: Knee  Location Orientation: Left;Distal  Staging: Unstageable - Full thickness tissue loss in which the base of the injury is covered by slough (yellow, tan, gray, green or brown) and/or eschar (tan, brown or black) in the wound bed.  Wound Description (Comments): Stump  Present on Admission: Yes     Pressure Injury 05/22/19 Thigh Left;Mid Stage 2 -  Partial thickness loss of dermis presenting as Gina [name] shallow open injury with Gina [name] red, pink wound bed without slough. (Active)  05/22/19 2109  Location: Thigh  Location Orientation: Left;Mid  Staging: Stage 2 -  Partial thickness loss of dermis presenting as Gina [name] shallow open injury with Gina [name] red, pink wound bed without slough.  Wound Description (Comments):   Present on Admission: Yes     Pressure Injury 07/22/19 Sacrum Mid Stage 3 -  Full thickness tissue loss. Subcutaneous fat may be visible but bone, tendon or muscle are NOT exposed. (Active)  07/22/19   Location: Sacrum  Location Orientation: Mid  Staging: Stage 3 -  Full thickness tissue loss. Subcutaneous fat may be visible but bone, tendon or muscle are NOT exposed.  Wound Description (Comments):   Present on Admission: Yes     Pressure Injury 07/22/19 Leg Left Stage 3 -  Full thickness tissue loss. Subcutaneous fat may be visible but bone, tendon or muscle are NOT exposed. (Active)  07/22/19   Location: Leg  Location Orientation: Left  Staging: Stage 3 -  Full thickness tissue loss. Subcutaneous fat may be visible but bone, tendon or muscle are NOT exposed.  Wound Description (Comments):   Present on Admission: Yes     Pressure Injury 07/22/19 Leg Right Stage 3 -  Full thickness tissue loss. Subcutaneous fat may be visible but bone, tendon or  muscle are NOT exposed. (Active)  07/22/19   Location: Leg  Location Orientation: Right  Staging: Stage 3 -  Full thickness tissue loss. Subcutaneous fat may be visible but bone, tendon or muscle are NOT exposed.  Wound Description (Comments):   Present on Admission: Yes   History of perforated gastric ulcer-status post [name] patch. - On PPI  Hypoalbuminemia -s/p albumin   Severe protein calorie malnutrition -TPN on hold with   DVT prophylaxis: lovenox - hold due to anemia and + hemoccult Code Status: DNR Family Communication: none at bedside - daughter 6/7 Disposition:   Status is: Inpatient  Remains inpatient appropriate because:Inpatient level of care appropriate due to severity of illness   Dispo: The patient is from: Home              Anticipated d/c is to: pending              Anticipated d/c date is: > 3 days              Patient currently is not  medically stable to d/c.   Consultants:   ID  Procedures:   none  Antimicrobials:  Anti-infectives (From admission, onward)   Start     Dose/Rate Route Frequency Ordered Stop   08/19/19 1100  anidulafungin (ERAXIS) 100 mg in sodium chloride 0.9 % 100 mL IVPB  Status:  Discontinued     100 mg 78 mL/hr over 100 Minutes Intravenous Every 24 hours 08/18/19 1111 08/18/19 1115   08/18/19 2130  vancomycin (VANCOREADY) IVPB 750 mg/150 mL     750 mg 150 mL/hr over 60 Minutes Intravenous Every 24 hours 08/17/19 1536     08/18/19 1115  anidulafungin (ERAXIS) 200 mg in sodium chloride 0.9 % 200 mL IVPB  Status:  Discontinued     200 mg 78 mL/hr over 200 Minutes Intravenous  Once 08/18/19 1111 08/18/19 1115   08/17/19 2200  ceFEPIme (MAXIPIME) 2 g in sodium chloride 0.9 % 100 mL IVPB     2 g 200 mL/hr over 30 Minutes Intravenous Every 12 hours 08/17/19 1122     08/17/19 1545  vancomycin (VANCOCIN) IVPB 1000 mg/200 mL premix     1,000 mg 200 mL/hr over 60 Minutes Intravenous  Once 08/17/19 1536 08/18/19 0100   08/17/19  0900  ceFEPIme (MAXIPIME) 2 g in sodium chloride 0.9 % 100 mL IVPB     2 g 200 mL/hr over 30 Minutes Intravenous  Once 08/17/19 0854 08/17/19 1102     Subjective: No new complaints today Can't clearly say why she came yesterday  Objective: Vitals:   08/18/19 0900 08/18/19 1000 08/18/19 1005 08/18/19 1037  BP: 131/82 123/66 (!) 124/98 136/86  Pulse: 72 69 70   Resp: 16 14 13 18   Temp:   (!) 97.4 F (36.3 C) (!) 97.3 F (36.3 C)  TempSrc:   Oral Oral  SpO2:   99%   Weight:      Height:        Intake/Output Summary (Last 24 hours) at 08/18/2019 1100 Last data filed at 08/18/2019 1005 Gross per 24 hour  Intake 6882.33 ml  Output --  Net 6882.33 ml   Filed Weights   08/17/19 0900  Weight: 49.9 kg    Examination:  General exam: Appears calm and comfortable.  Chronically ill appearing. Respiratory system: Clear to auscultation. Respiratory effort normal. Cardiovascular system: S1 & S2 heard, RRR. Gastrointestinal system: Abdomen is nondistended, soft and nontender - enterocutaneous fistula with pouch  Central nervous system: Alert and oriented. No focal neurological deficits. Extremities: bilateral amputations LE Skin: No rashes, lesions or ulcers Psychiatry: Judgement and insight appear normal. Mood & affect appropriate.     Data Reviewed: I have personally reviewed following labs and imaging studies  CBC: Recent Labs  Lab 08/17/19 0950 08/17/19 1331 08/18/19 0443  WBC 4.8  --  5.0  NEUTROABS 4.1  --  3.8  HGB 7.5* 6.8* 5.6*  HCT 24.4* 20.0* 17.6*  MCV 92.8  --  90.3  PLT 195  --  141*    Basic Metabolic Panel: Recent Labs  Lab 08/17/19 0950 08/17/19 1312 08/17/19 1331 08/17/19 2051 08/18/19 0443  NA 128* 129* 131* 131* 132*  K 5.3* 5.1 5.0 5.3* 4.0  CL 96* 97*  --  101 105  CO2 19* 20*  --  22 22  GLUCOSE 746* 602*  --  177* 215*  BUN 74* 70*  --  68* 63*  CREATININE 1.40* 1.42*  --  1.34* 1.22*  CALCIUM 8.6* 8.2*  --  8.2* 8.0*  MG  --   --    --   --  1.8  PHOS  --   --   --   --  3.4    GFR: Estimated Creatinine Clearance: 39.6 mL/min (Gina [name]) (by C-G formula based on SCr of 1.22 mg/dL (H)).  Liver Function Tests: Recent Labs  Lab 08/17/19 0950 08/18/19 0443  AST 20 39  ALT 20 30  ALKPHOS 72 51  BILITOT 0.6 0.6  PROT 7.8 5.6*  ALBUMIN 1.9* 1.5*    CBG: Recent Labs  Lab 08/17/19 1715 08/17/19 1833 08/17/19 2105 08/18/19 0545 08/18/19 0922  GLUCAP 165* 128* 271* 211* 182*     Recent Results (from the past 240 hour(s))  SARS Coronavirus 2 by RT PCR (hospital order, performed in [facility] hospital lab) Nasopharyngeal Nasopharyngeal Swab     Status: None   Collection Time: 08/17/19  8:55 AM   Specimen: Nasopharyngeal Swab  Result Value Ref Range Status   SARS Coronavirus 2 NEGATIVE NEGATIVE Final    Comment: (NOTE) SARS-CoV-2 target nucleic acids are NOT DETECTED. The SARS-CoV-2 RNA is generally detectable in upper and lower respiratory specimens during the acute phase of infection. The lowest concentration of SARS-CoV-2 viral copies this assay can detect is 250 copies / mL. Gina [name] negative result does not preclude SARS-CoV-2 infection and should not be used as the sole basis for treatment or other patient management decisions.  Candler Ginsberg negative result may occur with improper specimen collection / handling, submission of specimen other than nasopharyngeal swab, presence of viral mutation(s) within the areas targeted by this assay, and inadequate number of viral copies (<250 copies / mL). Gina [name] negative result must be combined with clinical observations, patient history, and epidemiological information. Fact Sheet for Patients:   StrictlyIdeas.no Fact Sheet for Healthcare Providers: BankingDealers.co.za This test is not yet approved or cleared  by the [place] FDA and has been authorized for detection and/or diagnosis of SARS-CoV-2 by FDA under an Emergency Use  Authorization (EUA).  This EUA will remain in effect (meaning this test can be used) for the duration of the COVID-19 declaration under Section 564(b)(1) of the Act, 21 U.S.C. section 360bbb-3(b)(1), unless the authorization is terminated or revoked sooner. Performed at [facility], [place] [address], [place], [place] [address]   Blood Culture (routine x 2)     Status: None (Preliminary result)   Collection Time: 08/17/19  9:50 AM   Specimen: BLOOD LEFT ARM  Result Value Ref Range Status   Specimen Description BLOOD LEFT ARM  Final   Special Requests   Final    BOTTLES DRAWN AEROBIC AND ANAEROBIC Blood Culture adequate volume   Culture  Setup Time   Final    BUDDING YEAST SEEN ANAEROBIC BOTTLE ONLY Organism ID to follow    Culture   Final    NO GROWTH < 24 HOURS Performed at [facility], [place] [address] [place], [place] [address]    Report Status PENDING  Incomplete  Blood Culture (routine x 2)     Status: None (Preliminary result)   Collection Time: 08/17/19  1:12 PM   Specimen: BLOOD LEFT ARM  Result Value Ref Range Status   Specimen Description BLOOD LEFT ARM  Final   Special Requests AEROBIC BOTTLE ONLY Blood Culture adequate volume  Final   Culture   Final    NO GROWTH < 24 HOURS Performed at [facility], [place] [address] [place], [place]  [address]    Report Status PENDING  Incomplete  MRSA PCR Screening     Status: Abnormal   Collection Time: 08/17/19  8:23 PM   Specimen: Nasal Mucosa; Nasopharyngeal  Result Value Ref Range Status   MRSA by PCR POSITIVE (Gina [name]) NEGATIVE Final    Comment:        The GeneXpert MRSA Assay (FDA approved for NASAL specimens only), is one component of Gina [name] comprehensive MRSA colonization surveillance program. It is not intended to diagnose MRSA infection nor to guide or monitor treatment for MRSA infections. RESULT CALLED TO, READ BACK BY AND VERIFIED WITH: J. MILLER,CHARGE RN [id number] 08/17/2019 Mena Goes Performed at [facility], [place] [address], [place], [place] [address]          Radiology Studies: DG Chest Port 1 View  Result Date: 08/17/2019 CLINICAL DATA:  Fever of unknown origin. EXAM: PORTABLE CHEST 1 VIEW COMPARISON:  07/04/2019 FINDINGS: Cardiac silhouette is normal in size. No mediastinal or hilar masses. No evidence of adenopathy. Mild atelectasis in the medial lung bases similar to the prior study. Lungs otherwise clear. Lung volumes are low. No convincing pleural effusion and no pneumothorax. Right PICC is stable, catheter tip in the lower superior vena cava. Skeletal structures are grossly intact. IMPRESSION: No acute cardiopulmonary disease. Electronically Signed   By: Lajean Manes M.D.   On: 08/17/2019 09:42        Scheduled Meds: . ascorbic acid  500 mg Oral BID  . Chlorhexidine Gluconate Cloth  6 each Topical Daily  . enoxaparin (LOVENOX) injection  40 mg Subcutaneous Q24H  . escitalopram  20 mg Oral Daily  . ferrous sulfate  300 mg Oral Q breakfast  . gabapentin  300 mg Oral BID  . insulin aspart  0-9 Units Subcutaneous Q4H  . melatonin  3 mg Oral QHS  . mupirocin ointment  1 application Nasal BID  . pantoprazole  40 mg Oral Daily   Continuous Infusions: . ceFEPime (MAXIPIME) IV 2 g (08/18/19 0956)  . TPN ADULT (ION)    . vancomycin       LOS: 1 day    Time spent: over 30 min    Fayrene Helper, MD [facility]   To contact the attending provider between 7A-7P or the covering provider during after hours 7P-7A, please log into the web site [email or web address] and access using universal [place] password for that web site. If you do not have the password, please call the hospital operator.  08/18/2019, 11:00 AM

## 2019-08-19 LAB — COMPREHENSIVE METABOLIC PANEL
ALT: 24 U/L (ref 0–44)
AST: 30 U/L (ref 15–41)
Albumin: 2 g/dL — ABNORMAL LOW (ref 3.5–5.0)
Alkaline Phosphatase: 58 U/L (ref 38–126)
Anion gap: 9 (ref 5–15)
BUN: 43 mg/dL — ABNORMAL HIGH (ref 6–20)
CO2: 18 mmol/L — ABNORMAL LOW (ref 22–32)
Calcium: 8.9 mg/dL (ref 8.9–10.3)
Chloride: 106 mmol/L (ref 98–111)
Creatinine, Ser: 0.93 mg/dL (ref 0.44–1.00)
GFR calc Af Amer: >60 mL/min (ref >60)
GFR calc non Af Amer: >60 mL/min (ref >60)
Glucose, Bld: 94 mg/dL (ref 70–99)
Potassium: 3.9 mmol/L (ref 3.5–5.1)
Sodium: 133 mmol/L — ABNORMAL LOW (ref 135–145)
Total Bilirubin: 0.7 mg/dL (ref 0.3–1.2)
Total Protein: 6.2 g/dL — ABNORMAL LOW (ref 6.5–8.1)

## 2019-08-19 LAB — CBC WITH DIFFERENTIAL/PLATELET
Abs Immature Granulocytes: 0.02 10*3/uL (ref 0.00–0.07)
Basophils Absolute: 0 10*3/uL (ref 0.0–0.1)
Basophils Relative: 0 %
Eosinophils Absolute: 0.1 10*3/uL (ref 0.0–0.5)
Eosinophils Relative: 1 %
HCT: 30.4 % — ABNORMAL LOW (ref 36.0–46.0)
Hemoglobin: 9.9 g/dL — ABNORMAL LOW (ref 12.0–15.0)
Immature Granulocytes: 0 %
Lymphocytes Relative: 17 %
Lymphs Abs: 1.1 10*3/uL (ref 0.7–4.0)
MCH: 28.9 pg (ref 26.0–34.0)
MCHC: 32.6 g/dL (ref 30.0–36.0)
MCV: 88.6 fL (ref 80.0–100.0)
Monocytes Absolute: 0.8 10*3/uL (ref 0.1–1.0)
Monocytes Relative: 12 %
Neutro Abs: 4.5 10*3/uL (ref 1.7–7.7)
Neutrophils Relative %: 70 %
Platelets: 127 10*3/uL — ABNORMAL LOW (ref 150–400)
RBC: 3.43 MIL/uL — ABNORMAL LOW (ref 3.87–5.11)
RDW: 16.1 % — ABNORMAL HIGH (ref 11.5–15.5)
WBC: 6.5 10*3/uL (ref 4.0–10.5)
nRBC: 0 % (ref 0.0–0.2)

## 2019-08-19 LAB — BPAM RBC
Blood Product Expiration Date: 202107062359
Blood Product Expiration Date: 202107062359
ISSUE DATE / TIME: 202106070733
ISSUE DATE / TIME: 202106071014
Unit Type and Rh: 5100
Unit Type and Rh: 5100

## 2019-08-19 LAB — GLUCOSE, CAPILLARY
Glucose-Capillary: 101 mg/dL — ABNORMAL HIGH (ref 70–99)
Glucose-Capillary: 105 mg/dL — ABNORMAL HIGH (ref 70–99)
Glucose-Capillary: 73 mg/dL (ref 70–99)
Glucose-Capillary: 89 mg/dL (ref 70–99)
Glucose-Capillary: 92 mg/dL (ref 70–99)
Glucose-Capillary: 97 mg/dL (ref 70–99)

## 2019-08-19 LAB — TYPE AND SCREEN
ABO/RH(D): O POS
Antibody Screen: NEGATIVE
Unit division: 0
Unit division: 0

## 2019-08-19 LAB — PHOSPHORUS: Phosphorus: 3.3 mg/dL (ref 2.5–4.6)

## 2019-08-19 LAB — MAGNESIUM: Magnesium: 1.7 mg/dL (ref 1.7–2.4)

## 2019-08-19 MED ORDER — INSULIN ASPART 100 UNIT/ML ~~LOC~~ SOLN
0.0000 [IU] | Freq: Every day | SUBCUTANEOUS | Status: DC
  Administered 2019-08-21: 3 [IU] via SUBCUTANEOUS

## 2019-08-19 MED ORDER — INSULIN ASPART 100 UNIT/ML ~~LOC~~ SOLN
0.0000 [IU] | Freq: Three times a day (TID) | SUBCUTANEOUS | Status: DC
  Administered 2019-08-20: 2 [IU] via SUBCUTANEOUS
  Administered 2019-08-22: 9 [IU] via SUBCUTANEOUS

## 2019-08-19 NOTE — Progress Notes (Addendum)
PROGRESS NOTE    Gina [name]  IHK:[id number] DOB: 02-08-1961 DOA: 08/17/2019 PCP: Gina Serene, MD   Chief Complaint  Patient presents with  . Fever    Brief Narrative:  Gina [name] is Gina [name] 59 y.o. female with medical history significant of perforated prepyloric ulcer in 2020 s/p exlap with Gina [name] patch, chronic TPN via right arm PICC, IDDM, bilateral lower quadrant drains along with enterocutaneous fistula with colostomy bag, Chronic sacral pressure ulcers stage II, HTN, schizophrenia, bilateral BKA, PVD s/p B/L BKA,  sent from from home for evaluation of fever. She was admitted few times this year for sepsis, including MRSA bacteremia fungemia in March, Candida peritonitis in April and Proteus UTI in May.  Pt on TPN and she also take soft diet to improve her nutrition condition. Daughter noticed that pt has had decreased appetite for last 3-4 day and was warm to touch last night and complained about feeling of nausea but no vomiting. No abd pain. She also seemed to have burning sensation when urinated. Daughter reported that she does wound care everyday herself and did not see significant changes associated with the sacral wounds. Daughter also reported that pt Glucose has been fairly controlled at home seldom >200. ED Course: Cre 1.4, CrCL 35 ml/min, Tmax 103.1, WBC WNL, LA 1.8. UA showed WBC 11-20/hpf. Xray no infiltrates. GLU >700  Assessment & Plan:   Principal Problem:   Fever Active Problems:   HTN (hypertension)   Diabetes (HCC)   Anemia of chronic disease   Sepsis ([place])   Schizophrenia ([place])   Acute kidney injury ([place])   Sacral decubitus ulcer   Normocytic anemia   S/P BKA (below knee amputation) bilateral (HCC)   Status post colostomy (HCC)   Enterocutaneous fistula  Sepsis 2/2 Candida Glabrata Fungemia - fever, normal WBC, tachycardia, normal RR - candida glabrata on 1/2 blood cx from 6/6 - repeat blood cx from 6/8 pending - pt on TPN and with picc line -> PICC  now d/c'd, holding on TPN - CT without significant change in size of fluid collection  - Appreciate ID assistance - continue eraxis, pt will need dilated eye exam, will discuss with ophtho  History of perforated gastric ulcer-status post oversewing and Graham patch 01/2019  Enterocutaneous Fistula: imaging with persistent fluid collection within ventral aspect of peritoneal cavity with continued fistula to skin surrface at level of umbilicus - presumably ongoing enterocutaneous fistula - no significant change in size  Per surgery notes 06/2019, appears developed enterocutaneous fistula post op from perf ulcer treated with oversewing and graham patch Appreciate wound care recs Continue PPI  HHS  T2DM - Follow with SSI. Fasting BG this AM ~100 - a1c 7  Acute on Chronic Anemia iron deficiency and chronic illness related - likely dilutional drop this morning, no signs bleeding - transfuse 2 units pRBC -> Hb 9.9 today, follow  - follow iron, b12, folate, ferritin -> iron def anemia with chronic disease - hemoccult positive, but brown stool - consider GI c/s if recurrent drop in Hb after transfusion  AKI -improving, baseline creatinine <1 -continue IVF  Stage II pressure ulcer, chronic POA -Consult Wound Care  Pressure Injury 05/22/19 Coccyx Bilateral Stage 4 - Full thickness tissue loss with exposed bone, tendon or muscle. Per wound nurse, healing stage IV from prior admission (Active)  05/22/19 2104  Location: Coccyx  Location Orientation: Bilateral  Staging: Stage 4 - Full thickness tissue loss with exposed bone, tendon or muscle.  Wound Description (  Comments): Per wound nurse, healing stage IV from prior admission  Present on Admission: Yes     Pressure Injury 05/22/19 Knee Right;Distal Unstageable - Full thickness tissue loss in which the base of the injury is covered by slough (yellow, tan, gray, green or brown) and/or eschar (tan, brown or black) in the wound bed. Stump  (Active)  05/22/19 2106  Location: Knee  Location Orientation: Right;Distal  Staging: Unstageable - Full thickness tissue loss in which the base of the injury is covered by slough (yellow, tan, gray, green or brown) and/or eschar (tan, brown or black) in the wound bed.  Wound Description (Comments): Stump  Present on Admission: Yes     Pressure Injury 05/22/19 Knee Left;Distal Unstageable - Full thickness tissue loss in which the base of the injury is covered by slough (yellow, tan, gray, green or brown) and/or eschar (tan, brown or black) in the wound bed. Stump (Active)  05/22/19 2108  Location: Knee  Location Orientation: Left;Distal  Staging: Unstageable - Full thickness tissue loss in which the base of the injury is covered by slough (yellow, tan, gray, green or brown) and/or eschar (tan, brown or black) in the wound bed.  Wound Description (Comments): Stump  Present on Admission: Yes     Pressure Injury 05/22/19 Thigh Left;Mid Stage 2 -  Partial thickness loss of dermis presenting as Calah Gershman shallow open injury with [place] Jakub[place]ak red, pink wound bed without slough. (Active)  05/22/19 2109  Location: Thigh  Location Orientation: Left;Mid  Staging: Stage 2 -  Partial thickness loss of dermis presenting as Kaijah Abts shallow open injury with Iyona Pehrson red, pink wound bed without slough.  Wound Description (Comments):   Present on Admission: Yes     Pressure Injury 07/22/19 Sacrum Mid Stage 3 -  Full thickness tissue loss. Subcutaneous fat may be visible but bone, tendon or muscle are NOT exposed. (Active)  07/22/19   Location: Sacrum  Location Orientation: Mid  Staging: Stage 3 -  Full thickness tissue loss. Subcutaneous fat may be visible but bone, tendon or muscle are NOT exposed.  Wound Description (Comments):   Present on Admission: Yes     Pressure Injury 07/22/19 Leg Left Stage 3 -  Full thickness tissue loss. Subcutaneous fat may be visible but bone, tendon or muscle are NOT exposed. (Active)  07/22/19     Location: Leg  Location Orientation: Left  Staging: Stage 3 -  Full thickness tissue loss. Subcutaneous fat may be visible but bone, tendon or muscle are NOT exposed.  Wound Description (Comments):   Present on Admission: Yes     Pressure Injury 07/22/19 Leg Right Stage 3 -  Full thickness tissue loss. Subcutaneous fat may be visible but bone, tendon or muscle are NOT exposed. (Active)  07/22/19   Location: Leg  Location Orientation: Right  Staging: Stage 3 -  Full thickness tissue loss. Subcutaneous fat may be visible but bone, tendon or muscle are NOT exposed.  Wound Description (Comments):   Present on Admission: Yes   Hypoalbuminemia -s/p albumin   Severe protein calorie malnutrition -TPN on hold with   DVT prophylaxis: lovenox - hold due to anemia and + hemoccult Code Status: DNR Family Communication: none at bedside - daughter 6/8 Disposition:   Status is: Inpatient  Remains inpatient appropriate because:Inpatient level of care appropriate due to severity of illness   Dispo: The patient is from: Home              Anticipated d/c is to:  pending              Anticipated d/c date is: > 3 days              Patient currently is not medically stable to d/c.   Consultants:   ID  Procedures:   none  Antimicrobials:  Anti-infectives (From admission, onward)   Start     Dose/Rate Route Frequency Ordered Stop   08/19/19 1230  anidulafungin (ERAXIS) 100 mg in sodium chloride 0.9 % 100 mL IVPB     100 mg 78 mL/hr over 100 Minutes Intravenous Every 24 hours 08/18/19 1212     08/19/19 1100  anidulafungin (ERAXIS) 100 mg in sodium chloride 0.9 % 100 mL IVPB  Status:  Discontinued     100 mg 78 mL/hr over 100 Minutes Intravenous Every 24 hours 08/18/19 1111 08/18/19 1115   08/18/19 2130  vancomycin (VANCOREADY) IVPB 750 mg/150 mL  Status:  Discontinued     750 mg 150 mL/hr over 60 Minutes Intravenous Every 24 hours 08/17/19 1536 08/18/19 1603   08/18/19 1230   anidulafungin (ERAXIS) 200 mg in sodium chloride 0.9 % 200 mL IVPB     200 mg 78 mL/hr over 200 Minutes Intravenous  Once 08/18/19 1212 08/18/19 1650   08/18/19 1115  anidulafungin (ERAXIS) 200 mg in sodium chloride 0.9 % 200 mL IVPB  Status:  Discontinued     200 mg 78 mL/hr over 200 Minutes Intravenous  Once 08/18/19 1111 08/18/19 1115   08/17/19 2200  ceFEPIme (MAXIPIME) 2 g in sodium chloride 0.9 % 100 mL IVPB  Status:  Discontinued     2 g 200 mL/hr over 30 Minutes Intravenous Every 12 hours 08/17/19 1122 08/18/19 1603   08/17/19 1545  vancomycin (VANCOCIN) IVPB 1000 mg/200 mL premix     1,000 mg 200 mL/hr over 60 Minutes Intravenous  Once 08/17/19 1536 08/18/19 0100   08/17/19 0900  ceFEPIme (MAXIPIME) 2 g in sodium chloride 0.9 % 100 mL IVPB     2 g 200 mL/hr over 30 Minutes Intravenous  Once 08/17/19 0854 08/17/19 1102     Subjective: No new complaints  Objective: Vitals:   08/19/19 0004 08/19/19 0423 08/19/19 0801 08/19/19 1109  BP: 139/78 136/69 (!) 150/78 (!) 164/89  Pulse: 72 68  76  Resp: 16 16 13 15   Temp: 97.6 F (36.4 C) 97.8 F (36.6 C) 97.7 F (36.5 C) (!) 97.5 F (36.4 C)  TempSrc: Oral Oral Oral Oral  SpO2: 100% 99% 92% 100%  Weight:      Height:        Intake/Output Summary (Last 24 hours) at 08/19/2019 1148 Last data filed at 08/19/2019 1116 Gross per 24 hour  Intake 1460.74 ml  Output 2050 ml  Net -589.26 ml   Filed Weights   08/17/19 0900  Weight: 49.9 kg    Examination:  General: No acute distress. Cardiovascular: Heart sounds show Kristina Mcnorton regular rate, and rhythm. Lungs: Clear to auscultation bilaterally Abdomen: Soft, nontender, nondistended, enterocutaneous fistua emptying into pouch Neurological: Alert and oriented 3. Moves all extremities 4. Cranial nerves II through XII grossly intact. Skin: Warm and dry. No rashes or lesions. Extremities: bilateral lower extremity edema     Data Reviewed: I have personally reviewed following labs  and imaging studies  CBC: Recent Labs  Lab 08/17/19 0950 08/17/19 1331 08/18/19 0443 08/18/19 2109 08/19/19 0619  WBC 4.8  --  5.0  --  6.5  NEUTROABS 4.1  --  3.8  --  4.5  HGB 7.5* 6.8* 5.6* 10.2* 9.9*  HCT 24.4* 20.0* 17.6* 30.5* 30.4*  MCV 92.8  --  90.3  --  88.6  PLT 195  --  141*  --  127*    Basic Metabolic Panel: Recent Labs  Lab 08/17/19 0950 08/17/19 0950 08/17/19 1312 08/17/19 1331 08/17/19 2051 08/18/19 0443 08/19/19 0619  NA 128*   < > 129* 131* 131* 132* 133*  K 5.3*   < > 5.1 5.0 5.3* 4.0 3.9  CL 96*  --  97*  --  101 105 106  CO2 19*  --  20*  --  22 22 18*  GLUCOSE 746*  --  602*  --  177* 215* 94  BUN 74*  --  70*  --  68* 63* 43*  CREATININE 1.40*  --  1.42*  --  1.34* 1.22* 0.93  CALCIUM 8.6*  --  8.2*  --  8.2* 8.0* 8.9  MG  --   --   --   --   --  1.8 1.7  PHOS  --   --   --   --   --  3.4 3.3   < > = values in this interval not displayed.    GFR: Estimated Creatinine Clearance: 51.9 mL/min (by C-G formula based on SCr of 0.93 mg/dL).  Liver Function Tests: Recent Labs  Lab 08/17/19 0950 08/18/19 0443 08/19/19 0619  AST 20 39 30  ALT 20 30 24   ALKPHOS 72 51 58  BILITOT 0.6 0.6 0.7  PROT 7.8 5.6* 6.2*  ALBUMIN 1.9* 1.5* 2.0*    CBG: Recent Labs  Lab 08/18/19 1634 08/18/19 2109 08/19/19 0003 08/19/19 0550 08/19/19 0837  GLUCAP 73 69* 97 73 101*     Recent Results (from the past 240 hour(s))  SARS Coronavirus 2 by RT PCR (hospital order, performed in [facility] hospital lab) Nasopharyngeal Nasopharyngeal Swab     Status: None   Collection Time: 08/17/19  8:55 AM   Specimen: Nasopharyngeal Swab  Result Value Ref Range Status   SARS Coronavirus 2 NEGATIVE NEGATIVE Final    Comment: (NOTE) SARS-CoV-2 target nucleic acids are NOT DETECTED. The SARS-CoV-2 RNA is generally detectable in upper and lower respiratory specimens during the acute phase of infection. The lowest concentration of SARS-CoV-2 viral copies this assay  can detect is 250 copies / mL. Aleeyah Bensen negative result does not preclude SARS-CoV-2 infection and should not be used as the sole basis for treatment or other patient management decisions.  Tassie Pollett negative result may occur with improper specimen collection / handling, submission of specimen other than nasopharyngeal swab, presence of viral mutation(s) within the areas targeted by this assay, and inadequate number of viral copies (<250 copies / mL). Royer Cristobal negative result must be combined with clinical observations, patient history, and epidemiological information. Fact Sheet for Patients:   StrictlyIdeas.no Fact Sheet for Healthcare Providers: BankingDealers.co.za This test is not yet approved or cleared  by the [place] FDA and has been authorized for detection and/or diagnosis of SARS-CoV-2 by FDA under an Emergency Use Authorization (EUA).  This EUA will remain in effect (meaning this test can be used) for the duration of the COVID-19 declaration under Section 564(b)(1) of the Act, 21 U.S.C. section 360bbb-3(b)(1), unless the authorization is terminated or revoked sooner. Performed at [facility], [place] [address] [place], [place] [address]   Urine culture     Status: Abnormal (Preliminary result)  Collection Time: 08/17/19  9:05 AM   Specimen: In/Out Cath Urine  Result Value Ref Range Status   Specimen Description IN/OUT CATH URINE  Final   Special Requests NONE  Final   Culture (Joshuan Bolander)  Final    >=100,000 COLONIES/mL PROTEUS MIRABILIS SUSCEPTIBILITIES TO FOLLOW Performed at [facility], [place] [address], [place], [place] [address]    Report Status PENDING  Incomplete  Blood Culture (routine x 2)     Status: Abnormal (Preliminary result)   Collection Time: 08/17/19  9:50 AM   Specimen: BLOOD LEFT ARM  Result Value Ref Range Status   Specimen Description BLOOD LEFT ARM  Final   Special Requests   Final    BOTTLES DRAWN AEROBIC AND  ANAEROBIC Blood Culture adequate volume   Culture  Setup Time   Final    BUDDING YEAST SEEN ANAEROBIC BOTTLE ONLY CRITICAL RESULT CALLED TO, READ BACK BY AND VERIFIED WITH: Park Breed [id number] FCP Performed at [facility], [place] [address] [place], [place] [address]    Culture CANDIDA GLABRATA (Xiamara Hulet)  Final   Report Status PENDING  Incomplete  Blood Culture ID Panel (Reflexed)     Status: Abnormal   Collection Time: 08/17/19  9:50 AM  Result Value Ref Range Status   Enterococcus species NOT DETECTED NOT DETECTED Final   Listeria monocytogenes NOT DETECTED NOT DETECTED Final   Staphylococcus species NOT DETECTED NOT DETECTED Final   Staphylococcus aureus (BCID) NOT DETECTED NOT DETECTED Final   Streptococcus species NOT DETECTED NOT DETECTED Final   Streptococcus agalactiae NOT DETECTED NOT DETECTED Final   Streptococcus pneumoniae NOT DETECTED NOT DETECTED Final   Streptococcus pyogenes NOT DETECTED NOT DETECTED Final   Acinetobacter baumannii NOT DETECTED NOT DETECTED Final   Enterobacteriaceae species NOT DETECTED NOT DETECTED Final   Enterobacter cloacae complex NOT DETECTED NOT DETECTED Final   Escherichia coli NOT DETECTED NOT DETECTED Final   Klebsiella oxytoca NOT DETECTED NOT DETECTED Final   Klebsiella pneumoniae NOT DETECTED NOT DETECTED Final   Proteus species NOT DETECTED NOT DETECTED Final   Serratia marcescens NOT DETECTED NOT DETECTED Final   Haemophilus influenzae NOT DETECTED NOT DETECTED Final   Neisseria meningitidis NOT DETECTED NOT DETECTED Final   Pseudomonas aeruginosa NOT DETECTED NOT DETECTED Final   Candida albicans NOT DETECTED NOT DETECTED Final   Candida glabrata DETECTED (Adena Sima) NOT DETECTED Final    Comment: CRITICAL RESULT CALLED TO, READ BACK BY AND VERIFIED WITH: PHARMD JEREMY F. [id number] FCP    Candida krusei NOT DETECTED NOT DETECTED Final   Candida parapsilosis NOT DETECTED NOT DETECTED Final   Candida tropicalis NOT DETECTED NOT  DETECTED Final    Comment: Performed at [facility], [address] [place], [place] [address]  Blood Culture (routine x 2)     Status: None (Preliminary result)   Collection Time: 08/17/19  1:12 PM   Specimen: BLOOD LEFT ARM  Result Value Ref Range Status   Specimen Description BLOOD LEFT ARM  Final   Special Requests   Final    BOTTLES DRAWN AEROBIC ONLY Blood Culture adequate volume   Culture   Final    NO GROWTH 2 DAYS Performed at [facility], [place] [address], [place], [place] [address]    Report Status PENDING  Incomplete  MRSA PCR Screening     Status: Abnormal   Collection Time: 08/17/19  8:23 PM   Specimen: Nasal Mucosa; Nasopharyngeal  Result Value Ref Range Status   MRSA by PCR POSITIVE (Karole Oo) NEGATIVE Final    Comment:        The GeneXpert MRSA Assay (FDA approved for NASAL specimens only), is one component of Romeka Scifres comprehensive MRSA colonization surveillance program. It is not intended to diagnose MRSA infection nor to guide or monitor treatment for MRSA infections. RESULT CALLED TO, READ BACK BY AND VERIFIED WITH: J. MILLER,CHARGE RN [id number] 08/17/2019 Mena Goes Performed at [facility], [place] [address] [place], [place] [address]          Radiology Studies: CT ABDOMEN PELVIS W CONTRAST  Result Date: 08/18/2019 CLINICAL DATA:  Sepsis EXAM: CT ABDOMEN AND PELVIS WITH CONTRAST TECHNIQUE: Multidetector CT imaging of the abdomen and pelvis was performed using the standard protocol following bolus administration of intravenous contrast. CONTRAST:  177mL OMNIPAQUE IOHEXOL 300 MG/ML  SOLN COMPARISON:  07/05/2019 FINDINGS: Lower chest: There are trace bilateral pleural effusions with minimal bilateral dependent lower lobe atelectasis. Hepatobiliary: Periportal edema is again identified unchanged. Calcified gallstones are identified, with trace pericholecystic fluid. No gallbladder wall thickening. Pancreas: Unremarkable. No pancreatic ductal dilatation or  surrounding inflammatory changes. Spleen: Normal in size without focal abnormality. Adrenals/Urinary Tract: Adrenal glands are unremarkable. Kidneys are normal, without renal calculi, focal lesion, or hydronephrosis. Bladder is unremarkable. Stomach/Bowel: No bowel obstruction or ileus. Moderate retained stool within the colon. No bowel wall thickening or inflammatory change. Vascular/Lymphatic: Aortic atherosclerosis. No enlarged abdominal or pelvic lymph nodes. Reproductive: Uterus and bilateral adnexa are unremarkable. Other: Elongated gas and fluid collection within the ventral aspect of the peritoneal cavity is again noted, without significant change in size. This measures up to approximately 9 mm in thickness. There is evidence of fistula to the skin surface at the level of the umbilicus with overlying dressing in place. The percutaneous drainage catheter seen previously has been removed in the interim. Musculoskeletal: No acute or destructive bony lesions. Reconstructed images demonstrate no additional findings. IMPRESSION: 1. Continued fluid collection within the ventral aspect of the peritoneal cavity, with continued fistula to the skin surface at the level the umbilicus. 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 No significant change in size of the fluid collection despite removal of the percutaneous drainage catheter. 2. Stable periportal edema, nonspecific. 3. Cholelithiasis without cholecystitis. 4. Small bilateral pleural effusions. Electronically Signed   By: Randa Ngo M.D.   On: 08/18/2019 21:16   ECHOCARDIOGRAM COMPLETE  Result Date: 08/18/2019    ECHOCARDIOGRAM REPORT   Patient Name:   NINOSHKA WAINWRIGHT Date of Exam: 08/18/2019 Medical Rec #:  [id number]       Height:       62.0 in Accession #:    [id number]      Weight:       110.0 lb Date of Birth:  Aug 06, 1960        BSA:          1.483 m Patient Age:    81 years        BP:           133/66 mmHg  Patient Gender: F               HR:           72 bpm. Exam Location:  Inpatient Procedure: 2D Echo, Color Doppler and Cardiac Doppler Indications:    Candidemia  History:        Patient has prior history of Echocardiogram examinations, most  recent 05/27/2019. Risk Factors:Hypertension and Diabetes.  Sonographer:    Raquel Sarna Senior RDCS Referring Phys: [phone] Joceline Hinchcliff CALDWELL [place]  1. Left ventricular ejection fraction, by estimation, is 55 to 60%. The left ventricle has normal function. The left ventricle has no regional wall motion abnormalities. Left ventricular diastolic parameters are consistent with Grade I diastolic dysfunction (impaired relaxation).  2. Right ventricular systolic function is normal. The right ventricular size is normal. There is normal pulmonary artery systolic pressure.  3. Left atrial size was moderately dilated.  4. The mitral valve is abnormal. Trivial mitral valve regurgitation.  5. The aortic valve is tricuspid. Aortic valve regurgitation is not visualized.  6. The inferior vena cava is normal in size with greater than 50% respiratory variability, suggesting right atrial pressure of 3 mmHg. Comparison(s): No significant change from prior study. Conclusion(s)/Recommendation(s): No evidence of valvular vegetations on this transthoracic echocardiogram. Would recommend Shannin Naab transesophageal echocardiogram to exclude infective endocarditis if clinically indicated. FINDINGS  Left Ventricle: Left ventricular ejection fraction, by estimation, is 55 to 60%. The left ventricle has normal function. The left ventricle has no regional wall motion abnormalities. The left ventricular internal cavity size was normal in size. There is  no left ventricular hypertrophy. Left ventricular diastolic parameters are consistent with Grade I diastolic dysfunction (impaired relaxation). Indeterminate filling pressures. Right Ventricle: The right ventricular size is normal. No increase in right  ventricular wall thickness. Right ventricular systolic function is normal. There is normal pulmonary artery systolic pressure. The tricuspid regurgitant velocity is 1.55 m/s, and  with an assumed right atrial pressure of 3 mmHg, the estimated right ventricular systolic pressure is 71.6 mmHg. Left Atrium: Left atrial size was moderately dilated. Right Atrium: Right atrial size was normal in size. Pericardium: There is no evidence of pericardial effusion. Mitral Valve: The mitral valve is abnormal. There is mild thickening of the mitral valve leaflet(s). Trivial mitral valve regurgitation. Tricuspid Valve: The tricuspid valve is grossly normal. Tricuspid valve regurgitation is trivial. Aortic Valve: The aortic valve is tricuspid. Aortic valve regurgitation is not visualized. Pulmonic Valve: The pulmonic valve was not well visualized. Pulmonic valve regurgitation is not visualized. Aorta: The aortic root and ascending aorta are structurally normal, with no evidence of dilitation. Venous: The inferior vena cava is normal in size with greater than 50% respiratory variability, suggesting right atrial pressure of 3 mmHg. IAS/Shunts: No atrial level shunt detected by color flow Doppler.  LEFT VENTRICLE PLAX 2D LVIDd:         4.10 cm      Diastology LVIDs:         3.30 cm      LV e' lateral:   7.83 cm/s LV PW:         0.80 cm      LV E/e' lateral: 10.9 LV IVS:        0.80 cm      LV e' medial:    5.98 cm/s LVOT diam:     1.80 cm      LV E/e' medial:  14.3 LV SV:         57 LV SV Index:   39 LVOT Area:     2.54 cm  LV Volumes (MOD) LV vol d, MOD A2C: 114.0 ml LV vol d, MOD A4C: 92.9 ml LV vol s, MOD A2C: 55.5 ml LV vol s, MOD A4C: 43.1 ml LV SV MOD A2C:     58.5 ml LV SV MOD A4C:  92.9 ml LV SV MOD BP:      60.9 ml RIGHT VENTRICLE RV S prime:     8.38 cm/s TAPSE (M-mode): 1.7 cm LEFT ATRIUM             Index       RIGHT ATRIUM          Index LA diam:        3.40 cm 2.29 cm/m  RA Area:     8.97 cm LA Vol (A2C):   48.7 ml  32.84 ml/m RA Volume:   16.80 ml 11.33 ml/m LA Vol (A4C):   62.3 ml 42.02 ml/m LA Biplane Vol: 56.9 ml 38.37 ml/m  AORTIC VALVE LVOT Vmax:   95.30 cm/s LVOT Vmean:  61.800 cm/s LVOT VTI:    0.225 m  AORTA Ao Root diam: 2.60 cm Ao Asc diam:  2.80 cm MITRAL VALVE                TRICUSPID VALVE MV Area (PHT): 3.72 cm     TR Peak grad:   9.6 mmHg MV Decel Time: 204 msec     TR Vmax:        155.00 cm/s MV E velocity: 85.30 cm/s MV Gwin Eagon velocity: 104.00 cm/s  SHUNTS MV E/Shelitha Magley ratio:  0.82         Systemic VTI:  0.22 m                             Systemic Diam: 1.80 cm Lyman Bishop MD Electronically signed by Lyman Bishop MD Signature Date/Time: 08/18/2019/12:56:19 PM    Final         Scheduled Meds: . ascorbic acid  500 mg Oral BID  . Chlorhexidine Gluconate Cloth  6 each Topical Daily  . escitalopram  20 mg Oral Daily  . feeding supplement (ENSURE ENLIVE)  237 mL Oral TID BM  . ferrous sulfate  300 mg Oral Q breakfast  . gabapentin  300 mg Oral BID  . insulin aspart  0-9 Units Subcutaneous Q4H  . melatonin  3 mg Oral QHS  . mupirocin ointment  1 application Nasal BID  . pantoprazole  40 mg Oral BID   Continuous Infusions: . anidulafungin    . lactated ringers 75 mL/hr at 08/19/19 0600     LOS: 2 days    Time spent: over 30 min    Fayrene Helper, MD [facility]   To contact the attending provider between 7A-7P or the covering provider during after hours 7P-7A, please log into the web site [email or web address] and access using universal [place] password for that web site. If you do not have the password, please call the hospital operator.  08/19/2019, 11:48 AM

## 2019-08-19 NOTE — Consult Note (Signed)
Reason for consult:  Rule out fungal endophthalmitis  HPI: Gina [name] is an 59 y.o. female.  She has a candida fungal infection.  ID recommended ophthalmology consult to rule out fungal involvement in eyes.  Patient reports she "needs my bifocals."  She does not have them.  Vision blurry without her bifocals.  No flashes or floaters.  No eye pain.  Past ocular history:  "I need bifocals"  She does not recall last eye exam or last eye doctor.  She denies past eye surgery or laser. Poor historian.  Family Ocular history: She does not know of any glaucoma or macular degeneration in the family  Past Medical History:  Diagnosis Date  . Diabetes mellitus   . Hypertension   . Osteomyelitis of right foot ([place]) 02/22/2017  . Perforated gastric ulcer ([place])   . S/P bilateral BKA (below knee amputation) ([place])   . Schizophrenia ([place])   . Schizophrenia ([place])   . Septic arthritis of interphalangeal joint of toe of left foot ([place]) 06/02/2016  . Stress incontinence 04/26/2017   Past Surgical History:  Procedure Laterality Date  . AMPUTATION Left 06/05/2016   Procedure: LEFT FOOT TRANSMETATARSAL AMPUTATION;  Surgeon: [name], MD;  [facility];  Service: Orthopedics;  Laterality: Left;  . AMPUTATION Right 11/11/2017   Procedure: AMPUTATION BELOW KNEE;  Surgeon: [name], MD;  Location: [place];  Service: Orthopedics;  Laterality: Right;  . AMPUTATION Left 05/10/2018   Procedure: LEFT BELOW KNEE AMPUTATION;  Surgeon: [name], MD;  Location: [facility];  Service: Orthopedics;  Laterality: Left;  . AMPUTATION Left 12/28/2018   Procedure: AMPUTATION THIRD DIGIT;  Surgeon: Iran Planas, MD;  [facility];  Service: Orthopedics;  Laterality: Left;  . AMPUTATION TOE Right 02/23/2017   Procedure: AMPUTATION RIGHT THIRD TOE;  Surgeon: [name], MD;  Location: [facility];  Service: Orthopedics;  Laterality: Right;  . ESOPHAGOGASTRODUODENOSCOPY (EGD) WITH PROPOFOL N/A 07/24/2019    Procedure: ESOPHAGOGASTRODUODENOSCOPY (EGD) WITH PROPOFOL;  Surgeon: Wilford Corner, MD;  [facility];  Service: Endoscopy;  Laterality: N/A;  . I & D EXTREMITY Right 11/09/2017   Procedure: Debride Ulcer Right Heel;  Surgeon: [name], MD;  Location: [facility];  Service: Orthopedics;  Laterality: Right;  . I & D EXTREMITY Left 12/28/2018   Procedure: IRRIGATION AND DEBRIDEMENT EXTREMITY;  Surgeon: Iran Planas, MD;  [facility];  Service: Orthopedics;  Laterality: Left;  . INCISION AND DRAINAGE OF WOUND Left 12/26/2018   Procedure: IRRIGATION AND DEBRIDEMENT LEFT HAND;  Surgeon: Iran Planas, MD;  [facility];  Service: Orthopedics;  Laterality: Left;  . LAPAROTOMY N/A 02/02/2019   Procedure: EXPLORATORY LAPAROTOMY WITH OVERSEW OF DUODENAL ULCER;  Surgeon: Alphonsa Overall, MD;  [facility];  Service: General;  Laterality: N/A;  . TEE WITHOUT CARDIOVERSION N/A 05/27/2019   Procedure: TRANSESOPHAGEAL ECHOCARDIOGRAM (TEE);  Surgeon: Skeet Latch, MD;  [facility];  Service: Cardiovascular;  Laterality: N/A;  . TUBAL LIGATION     Family History  Problem Relation Age of Onset  . Diabetes type II Father   . CAD Father   . Prostate cancer Father   . Diabetes Mellitus II Brother    Current Facility-Administered Medications  Medication Dose Route Frequency Provider Last Rate Last Admin  . acetaminophen (TYLENOL) tablet 650 mg  650 mg Oral Q6H PRN Lovey Newcomer T, NP   650 mg at 08/17/19 2139  . anidulafungin (ERAXIS) 100 mg in sodium chloride 0.9 %  100 mL IVPB  100 mg Intravenous Q24H Thayer Headings, MD 78 mL/hr at 08/19/19 1535 Rate Verify at 08/19/19 1535  . ascorbic acid (VITAMIN C) tablet 500 mg  500 mg Oral BID Wynetta Fines T, MD   500 mg at 08/19/19 1101  . Chlorhexidine Gluconate Cloth 2 % PADS 6 each  6 each Topical Daily Lequita Halt, MD   6 each at 08/19/19 1102  . dextrose 50 % solution 0-50 mL  0-50 mL Intravenous PRN Wynetta Fines T, MD   25  mL at 08/18/19 2130  . escitalopram (LEXAPRO) tablet 20 mg  20 mg Oral Daily Wynetta Fines T, MD   20 mg at 08/19/19 1104  . feeding supplement (ENSURE ENLIVE) (ENSURE ENLIVE) liquid 237 mL  237 mL Oral TID BM Elodia Florence., MD      . ferrous sulfate 300 (60 Fe) MG/5ML syrup 300 mg  300 mg Oral Q breakfast Wynetta Fines T, MD   300 mg at 08/19/19 1103  . gabapentin (NEURONTIN) capsule 300 mg  300 mg Oral BID Wynetta Fines T, MD   300 mg at 08/19/19 1101  . insulin aspart (novoLOG) injection 0-5 Units  0-5 Units Subcutaneous QHS Elodia Florence., MD      . insulin aspart (novoLOG) injection 0-9 Units  0-9 Units Subcutaneous TID WC Elodia Florence., MD      . loperamide (IMODIUM) capsule 2 mg  2 mg Oral Q8H PRN Wynetta Fines T, MD      . melatonin tablet 3 mg  3 mg Oral QHS Wynetta Fines T, MD   3 mg at 08/18/19 2133  . metoprolol tartrate (LOPRESSOR) injection 5 mg  5 mg Intravenous Q6H PRN Wynetta Fines T, MD   5 mg at 08/19/19 1740  . mupirocin ointment (BACTROBAN) 2 % 1 application  1 application Nasal BID Lequita Halt, MD   1 application at 36/14/43 1101  . ondansetron (ZOFRAN) tablet 4 mg  4 mg Oral Q6H PRN Wynetta Fines T, MD      . oxyCODONE (Oxy IR/ROXICODONE) immediate release tablet 5 mg  5 mg Oral Q6H PRN Lequita Halt, MD   5 mg at 08/18/19 0902  . pantoprazole (PROTONIX) EC tablet 40 mg  40 mg Oral BID Elodia Florence., MD   40 mg at 08/19/19 1101  . simethicone (MYLICON) chewable tablet 80 mg  80 mg Oral QID PRN Wynetta Fines T, MD      . sodium chloride (OCEAN) 0.65 % nasal spray 1 spray  1 spray Each Nare Q4H PRN Lequita Halt, MD       Allergies  Allergen Reactions  . Bee Venom Swelling    Swells up with bee stings   . Metformin And Related Other (See Comments)    Upset stomach   Social History   Socioeconomic History  . Marital status: Single    Spouse name: Not on file  . Number of children: Not on file  . Years of education: Not on file  . Highest  education level: Not on file  Occupational History  . Not on file  Tobacco Use  . Smoking status: Never Smoker  . Smokeless tobacco: Never Used  Substance and Sexual Activity  . Alcohol use: No    Comment: occasionally  . Drug use: No  . Sexual activity: Not Currently  Other Topics Concern  . Not on file  Social History Narrative  .  Not on file   Social Determinants of Health   Financial Resource Strain:   . Difficulty of Paying Living Expenses:   Food Insecurity:   . Worried About Charity fundraiser in the Last Year:   . Arboriculturist in the Last Year:   Transportation Needs:   . Film/video editor (Medical):   [name] Lack of Transportation (Non-Medical):   Physical Activity:   . Days of Exercise per Week:   . Minutes of Exercise per Session:   Stress:   . Feeling of Stress :   Social Connections:   . Frequency of Communication with Friends and Family:   . Frequency of Social Gatherings with Friends and Family:   . Attends Religious Services:   . Active Member of Clubs or Organizations:   . Attends Archivist Meetings:   [name] Marital Status:   Intimate Partner Violence:   . Fear of Current or Ex-Partner:   . Emotionally Abused:   [name] Physically Abused:   . Sexually Abused:     Review of systems: Review of Systems  Constitutional: Positive for malaise/fatigue.  HENT: Negative for sinus pain.   Eyes: Positive for blurred vision.  Respiratory: Positive for cough.   Cardiovascular: Negative for chest pain and leg swelling.  Gastrointestinal: Positive for abdominal pain.  Genitourinary: Negative for flank pain.  Musculoskeletal: Negative for neck pain.  Skin: Negative for rash.  Neurological: Negative.   All other systems reviewed and are negative.   Physical Exam:  Blood pressure (!) 189/80, pulse 71, temperature 97.9 F (36.6 C), temperature source Oral, resp. rate 14, height 5\' 2"  (1.575 m), weight 49.9 kg, last menstrual period 03/29/2015, SpO2 99  %.   VAsc near:  OD 20/50 OS  20/50  Pupils:   OD round, reactive to light, no APD            OS round, reactive to light, no APD  IOP (T pen)  OD  13 OS  14  JWJ:XBJYNW (patient not maintaining fixation)  Motility:  OD full ductions  OS full ductions  Balance/alignment:  Ortho by Luiz Ochoa   Bedside examination:                                 OD                                       External/adnexa: Normal                                      Lids/lashes:        Normal                                      Conjunctiva        White, quiet, pingueculae        Cornea:              Clear                  AC:                     Deep, quiet  Iris:                     Normal        Lens:                  NS                                       OS                                       External/adnexa: Normal                                      Lids/lashes:        Normal                                      Conjunctiva        White, quiet, pingueculae        Cornea:              Clear                  AC:                     Deep, quiet                                Iris:                     Normal        Lens:                  NS       Dilated fundus exam: (Neo 2.5; Myd 1%)      OD Vitreous            Clear, quiet                                Optic Disc:       Normal, perfused                      Macula:             Flat                                            Vessels:           Attenuated vessels         Periphery:         Flat, attached                                      OS Vitreous            Clear, quiet  Optic Disc:       Normal, perfused                      Macula:             Flat                                            Vessels:          Attenuated vessels        Periphery:         Flat, attached        Labs/studies: Results for orders placed or performed during the hospital encounter of  08/17/19 (from the past 48 hour(s))  Basic metabolic panel     Status: Abnormal   Collection Time: 08/17/19  8:51 PM  Result Value Ref Range   Sodium 131 (L) 135 - 145 mmol/L   Potassium 5.3 (H) 3.5 - 5.1 mmol/L   Chloride 101 98 - 111 mmol/L   CO2 22 22 - 32 mmol/L   Glucose, Bld 177 (H) 70 - 99 mg/dL    Comment: Glucose reference range applies only to samples taken after fasting for at least 8 hours.   BUN 68 (H) 6 - 20 mg/dL   Creatinine, Ser 1.34 (H) 0.44 - 1.00 mg/dL   Calcium 8.2 (L) 8.9 - 10.3 mg/dL   GFR calc non Af Amer 44 (L) >60 mL/min   GFR calc Af Amer 50 (L) >60 mL/min   Anion gap 8 5 - 15    Comment: Performed at [facility] [address] [place], [place] [address]  Glucose, capillary     Status: Abnormal   Collection Time: 08/17/19  9:05 PM  Result Value Ref Range   Glucose-Capillary 271 (H) 70 - 99 mg/dL    Comment: Glucose reference range applies only to samples taken after fasting for at least 8 hours.  CBC with Differential/Platelet     Status: Abnormal   Collection Time: 08/18/19  4:43 AM  Result Value Ref Range   WBC 5.0 4.0 - 10.5 K/uL   RBC 1.95 (L) 3.87 - 5.11 MIL/uL   Hemoglobin 5.6 (LL) 12.0 - 15.0 g/dL    Comment: REPEATED TO VERIFY THIS CRITICAL RESULT HAS VERIFIED AND BEEN CALLED TO J.MILLER,RN BY MELISSA BROGDON ON 06 07 2021 AT 0501, AND HAS BEEN READ BACK.     HCT 17.6 (L) 36.0 - 46.0 %   MCV 90.3 80.0 - 100.0 fL   MCH 28.7 26.0 - 34.0 pg   MCHC 31.8 30.0 - 36.0 g/dL   RDW 16.7 (H) 11.5 - 15.5 %   Platelets 141 (L) 150 - 400 K/uL   nRBC 0.0 0.0 - 0.2 %   Neutrophils Relative % 77 %   Neutro Abs 3.8 1.7 - 7.7 K/uL   Lymphocytes Relative 13 %   Lymphs Abs 0.7 0.7 - 4.0 K/uL   Monocytes Relative 10 %   Monocytes Absolute 0.5 0.1 - 1.0 K/uL   Eosinophils Relative 0 %   Eosinophils Absolute 0.0 0.0 - 0.5 K/uL   Basophils Relative 0 %   Basophils Absolute 0.0 0.0 - 0.1 K/uL   WBC Morphology INCREASED BANDS (>20% BANDS)    Immature  Granulocytes 0 %   Abs Immature Granulocytes 0.01 0.00 - 0.07 K/uL   Polychromasia  PRESENT     Comment: Performed at [facility], [place] [address] [place], [place] [address]  Comprehensive metabolic panel     Status: Abnormal   Collection Time: 08/18/19  4:43 AM  Result Value Ref Range   Sodium 132 (L) 135 - 145 mmol/L   Potassium 4.0 3.5 - 5.1 mmol/L   Chloride 105 98 - 111 mmol/L   CO2 22 22 - 32 mmol/L   Glucose, Bld 215 (H) 70 - 99 mg/dL    Comment: Glucose reference range applies only to samples taken after fasting for at least 8 hours.   BUN 63 (H) 6 - 20 mg/dL   Creatinine, Ser 1.22 (H) 0.44 - 1.00 mg/dL   Calcium 8.0 (L) 8.9 - 10.3 mg/dL   Total Protein 5.6 (L) 6.5 - 8.1 g/dL   Albumin 1.5 (L) 3.5 - 5.0 g/dL   AST 39 15 - 41 U/L   ALT 30 0 - 44 U/L   Alkaline Phosphatase 51 38 - 126 U/L   Total Bilirubin 0.6 0.3 - 1.2 mg/dL   GFR calc non Af Amer 49 (L) >60 mL/min   GFR calc Af Amer 57 (L) >60 mL/min   Anion gap 5 5 - 15    Comment: Performed at [facility] [address], [place], [place] [address]  Magnesium     Status: None   Collection Time: 08/18/19  4:43 AM  Result Value Ref Range   Magnesium 1.8 1.7 - 2.4 mg/dL    Comment: Performed at [facility] [address] [place], [place] [address]  Phosphorus     Status: None   Collection Time: 08/18/19  4:43 AM  Result Value Ref Range   Phosphorus 3.4 2.5 - 4.6 mg/dL    Comment: Performed at [facility] [address] [place], [place] [address]  Triglycerides     Status: None   Collection Time: 08/18/19  4:43 AM  Result Value Ref Range   Triglycerides 133 <150 mg/dL    Comment: Performed at [facility] [address] [place], [place] [address]  Hemoglobin A1c     Status: Abnormal   Collection Time: 08/18/19  4:43 AM  Result Value Ref Range   Hgb A1c MFr Bld 7.0 (H) 4.8 - 5.6 %    Comment: (NOTE) Pre diabetes:          5.7%-6.4% Diabetes:              >6.4% Glycemic  control for   <7.0% adults with diabetes    Mean Plasma Glucose 154.2 mg/dL    Comment: Performed at [facility] [address], [place], [place] [address]  Iron and TIBC     Status: Abnormal   Collection Time: 08/18/19  5:00 AM  Result Value Ref Range   Iron 10 (L) 28 - 170 ug/dL   TIBC NOT CALCULATED 250 - 450 ug/dL    Comment: DUE TO TRANSFERRIN <70   Saturation Ratios NOT CALCULATED 10.4 - 31.8 %   UIBC NOT CALCULATED ug/dL    Comment: Performed at [facility], [place] [address], [place], [place] [address]  Vitamin B12     Status: None   Collection Time: 08/18/19  5:00 AM  Result Value Ref Range   Vitamin B-12 402 180 - 914 pg/mL    Comment: (NOTE) This assay is not validated for testing neonatal or myeloproliferative syndrome specimens for Vitamin  B12 levels. Performed at [facility], [place] [address] [place], [place] [address]   Ferritin     Status: Abnormal   Collection Time: 08/18/19  5:00 AM  Result Value Ref Range   Ferritin 1,228 (H) 11 - 307 ng/mL    Comment: Performed at [facility], [place] [address] [place], [place] [address]  Folate     Status: None   Collection Time: 08/18/19  5:00 AM  Result Value Ref Range   Folate 26.5 >5.9 ng/mL    Comment: Performed at [facility] [address] [place], [place] [address]  Type and screen [place]     Status: None   Collection Time: 08/18/19  5:15 AM  Result Value Ref Range   ABO/RH(D) O POS    Antibody Screen NEG    Sample Expiration 08/21/2019,2359    Unit Number [id number]    Blood Component Type RED CELLS,LR    Unit division 00    Status of Unit ISSUED,FINAL    Transfusion Status OK TO TRANSFUSE    Crossmatch Result      Compatible Performed at [facility], [place] [address], [place], [place] [address]    Unit Number [id number]    Blood Component Type RED CELLS,LR    Unit division 00    Status of Unit ISSUED,FINAL    Transfusion Status OK TO  TRANSFUSE    Crossmatch Result Compatible   Prepare RBC (crossmatch)     Status: None   Collection Time: 08/18/19  5:30 AM  Result Value Ref Range   Order Confirmation      ORDER PROCESSED BY BLOOD BANK Performed at [facility], [place] [address], [place], [place] [address]   Glucose, capillary     Status: Abnormal   Collection Time: 08/18/19  5:45 AM  Result Value Ref Range   Glucose-Capillary 211 (H) 70 - 99 mg/dL    Comment: Glucose reference range applies only to samples taken after fasting for at least 8 hours.  Prealbumin     Status: Abnormal   Collection Time: 08/18/19  7:58 AM  Result Value Ref Range   Prealbumin 5.1 (L) 18 - 38 mg/dL    Comment: Performed at [facility] [address] [place], [place] [address]  Glucose, capillary     Status: Abnormal   Collection Time: 08/18/19  9:22 AM  Result Value Ref Range   Glucose-Capillary 182 (H) 70 - 99 mg/dL    Comment: Glucose reference range applies only to samples taken after fasting for at least 8 hours.   Comment 1 Notify RN    Comment 2 Document in Chart   Glucose, capillary     Status: Abnormal   Collection Time: 08/18/19 11:06 AM  Result Value Ref Range   Glucose-Capillary 184 (H) 70 - 99 mg/dL    Comment: Glucose reference range applies only to samples taken after fasting for at least 8 hours.   Comment 1 Notify RN    Comment 2 Document in Chart   Occult blood card to lab, stool     Status: Abnormal   Collection Time: 08/18/19 12:37 PM  Result Value Ref Range   Fecal Occult Bld POSITIVE (A) NEGATIVE    Comment: Performed at [facility], [address] [place], [place] [address]  Glucose, capillary     Status: Abnormal   Collection Time: 08/18/19  4:13 PM  Result Value  Ref Range   Glucose-Capillary 59 (L) 70 - 99 mg/dL    Comment: Glucose reference range applies only to samples taken after fasting for at least 8 hours.   Comment 1 Notify RN    Comment 2 Document in Chart   Glucose,  capillary     Status: None   Collection Time: 08/18/19  4:34 PM  Result Value Ref Range   Glucose-Capillary 73 70 - 99 mg/dL    Comment: Glucose reference range applies only to samples taken after fasting for at least 8 hours.  Hemoglobin and hematocrit, blood     Status: Abnormal   Collection Time: 08/18/19  9:09 PM  Result Value Ref Range   Hemoglobin 10.2 (L) 12.0 - 15.0 g/dL    Comment: REPEATED TO VERIFY POST TRANSFUSION SPECIMEN DELTA CHECK NOTED    HCT 30.5 (L) 36.0 - 46.0 %    Comment: Performed at [facility] [address] [place], [place] [address]  Glucose, capillary     Status: Abnormal   Collection Time: 08/18/19  9:09 PM  Result Value Ref Range   Glucose-Capillary 69 (L) 70 - 99 mg/dL    Comment: Glucose reference range applies only to samples taken after fasting for at least 8 hours.  Glucose, capillary     Status: None   Collection Time: 08/19/19 12:03 AM  Result Value Ref Range   Glucose-Capillary 97 70 - 99 mg/dL    Comment: Glucose reference range applies only to samples taken after fasting for at least 8 hours.  Glucose, capillary     Status: None   Collection Time: 08/19/19  5:50 AM  Result Value Ref Range   Glucose-Capillary 73 70 - 99 mg/dL    Comment: Glucose reference range applies only to samples taken after fasting for at least 8 hours.  Comprehensive metabolic panel     Status: Abnormal   Collection Time: 08/19/19  6:19 AM  Result Value Ref Range   Sodium 133 (L) 135 - 145 mmol/L   Potassium 3.9 3.5 - 5.1 mmol/L   Chloride 106 98 - 111 mmol/L   CO2 18 (L) 22 - 32 mmol/L   Glucose, Bld 94 70 - 99 mg/dL    Comment: Glucose reference range applies only to samples taken after fasting for at least 8 hours.   BUN 43 (H) 6 - 20 mg/dL   Creatinine, Ser 0.93 0.44 - 1.00 mg/dL   Calcium 8.9 8.9 - 10.3 mg/dL   Total Protein 6.2 (L) 6.5 - 8.1 g/dL   Albumin 2.0 (L) 3.5 - 5.0 g/dL   AST 30 15 - 41 U/L   ALT 24 0 - 44 U/L   Alkaline Phosphatase 58  38 - 126 U/L   Total Bilirubin 0.7 0.3 - 1.2 mg/dL   GFR calc non Af Amer >60 >60 mL/min   GFR calc Af Amer >60 >60 mL/min   Anion gap 9 5 - 15    Comment: Performed at [facility] [address], [place], [place] [address]  Magnesium     Status: None   Collection Time: 08/19/19  6:19 AM  Result Value Ref Range   Magnesium 1.7 1.7 - 2.4 mg/dL    Comment: Performed at [facility] [address] [place],  [address]  Phosphorus     Status: None   Collection Time: 08/19/19  6:19 AM  Result Value Ref Range   Phosphorus 3.3 2.5 - 4.6 mg/dL  Comment: Performed at [facility], [place] [address], [place], [place] [address]  CBC with Differential/Platelet     Status: Abnormal   Collection Time: 08/19/19  6:19 AM  Result Value Ref Range   WBC 6.5 4.0 - 10.5 K/uL   RBC 3.43 (L) 3.87 - 5.11 MIL/uL   Hemoglobin 9.9 (L) 12.0 - 15.0 g/dL   HCT 30.4 (L) 36.0 - 46.0 %   MCV 88.6 80.0 - 100.0 fL   MCH 28.9 26.0 - 34.0 pg   MCHC 32.6 30.0 - 36.0 g/dL   RDW 16.1 (H) 11.5 - 15.5 %   Platelets 127 (L) 150 - 400 K/uL   nRBC 0.0 0.0 - 0.2 %   Neutrophils Relative % 70 %   Neutro Abs 4.5 1.7 - 7.7 K/uL   Lymphocytes Relative 17 %   Lymphs Abs 1.1 0.7 - 4.0 K/uL   Monocytes Relative 12 %   Monocytes Absolute 0.8 0.1 - 1.0 K/uL   Eosinophils Relative 1 %   Eosinophils Absolute 0.1 0.0 - 0.5 K/uL   Basophils Relative 0 %   Basophils Absolute 0.0 0.0 - 0.1 K/uL   Immature Granulocytes 0 %   Abs Immature Granulocytes 0.02 0.00 - 0.07 K/uL    Comment: Performed at [facility] [address], [place], [place] [address]  Glucose, capillary     Status: Abnormal   Collection Time: 08/19/19  8:37 AM  Result Value Ref Range   Glucose-Capillary 101 (H) 70 - 99 mg/dL    Comment: Glucose reference range applies only to samples taken after fasting for at least 8 hours.  Glucose, capillary     Status: Abnormal   Collection Time: 08/19/19  1:04 PM  Result Value Ref Range    Glucose-Capillary 105 (H) 70 - 99 mg/dL    Comment: Glucose reference range applies only to samples taken after fasting for at least 8 hours.  Glucose, capillary     Status: None   Collection Time: 08/19/19  5:09 PM  Result Value Ref Range   Glucose-Capillary 92 70 - 99 mg/dL    Comment: Glucose reference range applies only to samples taken after fasting for at least 8 hours.   CT ABDOMEN PELVIS W CONTRAST  Result Date: 08/18/2019 CLINICAL DATA:  Sepsis EXAM: CT ABDOMEN AND PELVIS WITH CONTRAST TECHNIQUE: Multidetector CT imaging of the abdomen and pelvis was performed using the standard protocol following bolus administration of intravenous contrast. CONTRAST:  187mL OMNIPAQUE IOHEXOL 300 MG/ML  SOLN COMPARISON:  07/05/2019 FINDINGS: Lower chest: There are trace bilateral pleural effusions with minimal bilateral dependent lower lobe atelectasis. Hepatobiliary: Periportal edema is again identified unchanged. Calcified gallstones are identified, with trace pericholecystic fluid. No gallbladder wall thickening. Pancreas: Unremarkable. No pancreatic ductal dilatation or surrounding inflammatory changes. Spleen: Normal in size without focal abnormality. Adrenals/Urinary Tract: Adrenal glands are unremarkable. Kidneys are normal, without renal calculi, focal lesion, or hydronephrosis. Bladder is unremarkable. Stomach/Bowel: No bowel obstruction or ileus. Moderate retained stool within the colon. No bowel wall thickening or inflammatory change. Vascular/Lymphatic: Aortic atherosclerosis. No enlarged abdominal or pelvic lymph nodes. Reproductive: Uterus and bilateral adnexa are unremarkable. Other: Elongated gas and fluid collection within the ventral aspect of the peritoneal cavity is again noted, without significant change in size. This measures up to approximately 9 mm in thickness. There is evidence of fistula to the skin surface at the level of the umbilicus with overlying dressing in place. The percutaneous  drainage catheter seen  previously has been removed in the interim. Musculoskeletal: No acute or destructive bony lesions. Reconstructed images demonstrate no additional findings. IMPRESSION: 1. Continued fluid collection within the ventral aspect of the peritoneal cavity, with continued fistula to the skin surface at the level the umbilicus. 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 No significant change in size of the fluid collection despite removal of the percutaneous drainage catheter. 2. Stable periportal edema, nonspecific. 3. Cholelithiasis without cholecystitis. 4. Small bilateral pleural effusions. Electronically Signed   By: Randa Ngo M.D.   On: 08/18/2019 21:16   ECHOCARDIOGRAM COMPLETE  Result Date: 08/18/2019    ECHOCARDIOGRAM REPORT   Patient Name:   WYNNE ROZAK Date of Exam: 08/18/2019 Medical Rec #:  [id number]       Height:       62.0 in Accession #:    [id number]      Weight:       110.0 lb Date of Birth:  [birth year]        BSA:          1.483 m Patient Age:    63 years        BP:           133/66 mmHg Patient Gender: F               HR:           72 bpm. Exam Location:  Inpatient Procedure: 2D Echo, Color Doppler and Cardiac Doppler Indications:    Candidemia  History:        Patient has prior history of Echocardiogram examinations, most                 recent 05/27/2019. Risk Factors:Hypertension and Diabetes.  Sonographer:    Raquel Sarna Senior RDCS Referring Phys: [phone] A CALDWELL [place]  1. Left ventricular ejection fraction, by estimation, is 55 to 60%. The left ventricle has normal function. The left ventricle has no regional wall motion abnormalities. Left ventricular diastolic parameters are consistent with Grade I diastolic dysfunction (impaired relaxation).  2. Right ventricular systolic function is normal. The right ventricular size is normal. There is normal pulmonary artery systolic pressure.  3. Left atrial size was  moderately dilated.  4. The mitral valve is abnormal. Trivial mitral valve regurgitation.  5. The aortic valve is tricuspid. Aortic valve regurgitation is not visualized.  6. The inferior vena cava is normal in size with greater than 50% respiratory variability, suggesting right atrial pressure of 3 mmHg. Comparison(s): No significant change from prior study. Conclusion(s)/Recommendation(s): No evidence of valvular vegetations on this transthoracic echocardiogram. Would recommend a transesophageal echocardiogram to exclude infective endocarditis if clinically indicated. FINDINGS  Left Ventricle: Left ventricular ejection fraction, by estimation, is 55 to 60%. The left ventricle has normal function. The left ventricle has no regional wall motion abnormalities. The left ventricular internal cavity size was normal in size. There is  no left ventricular hypertrophy. Left ventricular diastolic parameters are consistent with Grade I diastolic dysfunction (impaired relaxation). Indeterminate filling pressures. Right Ventricle: The right ventricular size is normal. No increase in right ventricular wall thickness. Right ventricular systolic function is normal. There is normal pulmonary artery systolic pressure. The tricuspid regurgitant velocity is 1.55 m/s, and  with an assumed right atrial pressure of 3 mmHg, the estimated right ventricular systolic pressure is 35.3 mmHg. Left Atrium: Left atrial size was moderately dilated. Right Atrium: Right atrial size was normal in size. Pericardium: There is  no evidence of pericardial effusion. Mitral Valve: The mitral valve is abnormal. There is mild thickening of the mitral valve leaflet(s). Trivial mitral valve regurgitation. Tricuspid Valve: The tricuspid valve is grossly normal. Tricuspid valve regurgitation is trivial. Aortic Valve: The aortic valve is tricuspid. Aortic valve regurgitation is not visualized. Pulmonic Valve: The pulmonic valve was not well visualized. Pulmonic  valve regurgitation is not visualized. Aorta: The aortic root and ascending aorta are structurally normal, with no evidence of dilitation. Venous: The inferior vena cava is normal in size with greater than 50% respiratory variability, suggesting right atrial pressure of 3 mmHg. IAS/Shunts: No atrial level shunt detected by color flow Doppler.  LEFT VENTRICLE PLAX 2D LVIDd:         4.10 cm      Diastology LVIDs:         3.30 cm      LV e' lateral:   7.83 cm/s LV PW:         0.80 cm      LV E/e' lateral: 10.9 LV IVS:        0.80 cm      LV e' medial:    5.98 cm/s LVOT diam:     1.80 cm      LV E/e' medial:  14.3 LV SV:         57 LV SV Index:   39 LVOT Area:     2.54 cm  LV Volumes (MOD) LV vol d, MOD A2C: 114.0 ml LV vol d, MOD A4C: 92.9 ml LV vol s, MOD A2C: 55.5 ml LV vol s, MOD A4C: 43.1 ml LV SV MOD A2C:     58.5 ml LV SV MOD A4C:     92.9 ml LV SV MOD BP:      60.9 ml RIGHT VENTRICLE RV S prime:     8.38 cm/s TAPSE (M-mode): 1.7 cm LEFT ATRIUM             Index       RIGHT ATRIUM          Index LA diam:        3.40 cm 2.29 cm/m  RA Area:     8.97 cm LA Vol (A2C):   48.7 ml 32.84 ml/m RA Volume:   16.80 ml 11.33 ml/m LA Vol (A4C):   62.3 ml 42.02 ml/m LA Biplane Vol: 56.9 ml 38.37 ml/m  AORTIC VALVE LVOT Vmax:   95.30 cm/s LVOT Vmean:  61.800 cm/s LVOT VTI:    0.225 m  AORTA Ao Root diam: 2.60 cm Ao Asc diam:  2.80 cm MITRAL VALVE                TRICUSPID VALVE MV Area (PHT): 3.72 cm     TR Peak grad:   9.6 mmHg MV Decel Time: 204 msec     TR Vmax:        155.00 cm/s MV E velocity: 85.30 cm/s MV A velocity: 104.00 cm/s  SHUNTS MV E/A ratio:  0.82         Systemic VTI:  0.22 m                             Systemic Diam: 1.80 cm Lyman Bishop MD Electronically signed by Lyman Bishop MD Signature Date/Time: 08/18/2019/12:56:19 PM    Final  Assessment and Plan: 1.  Cataracts 2. No fungal intraocular infection    All questions were answered.   Elmo L 08/19/2019,  8:41 PM  [facility] Ophthalmology [phone]

## 2019-08-19 NOTE — Progress Notes (Signed)
Gina [name] for Infectious Disease   Reason for visit: Follow up on Candidemia  Interval History: feels better overall; daughter at bedside.  Patient confirmed ok to discuss her medical care with her daughter present. Afebrile.  WBC wnl. She is having no vision changes or concerns.   Eraxis day 2  Physical Exam: Constitutional:  Vitals:   08/19/19 0423 08/19/19 0801  BP: 136/69 (!) 150/78  Pulse: 68   Resp: 16 13  Temp: 97.8 F (36.6 C) 97.7 F (36.5 C)  SpO2: 99% 92%   patient appears in NAD Respiratory: Normal respiratory effort; CTA B Cardiovascular: RRR GI: soft, nt, nd  Review of Systems: Constitutional: negative for fevers and chills Gastrointestinal: negative for diarrhea  Lab Results  Component Value Date   WBC 6.5 08/19/2019   HGB 9.9 (L) 08/19/2019   HCT 30.4 (L) 08/19/2019   MCV 88.6 08/19/2019   PLT 127 (L) 08/19/2019    Lab Results  Component Value Date   CREATININE 0.93 08/19/2019   BUN 43 (H) 08/19/2019   NA 133 (L) 08/19/2019   K 3.9 08/19/2019   CL 106 08/19/2019   CO2 18 (L) 08/19/2019    Lab Results  Component Value Date   ALT 24 08/19/2019   AST 30 08/19/2019   ALKPHOS 58 08/19/2019     Microbiology: Recent Results (from the past 240 hour(s))  SARS Coronavirus 2 by RT PCR (hospital order, performed in [place] hospital lab) Nasopharyngeal Nasopharyngeal Swab     Status: None   Collection Time: 08/17/19  8:55 AM   Specimen: Nasopharyngeal Swab  Result Value Ref Range Status   SARS Coronavirus 2 NEGATIVE NEGATIVE Final    Comment: (NOTE) SARS-CoV-2 target nucleic acids are NOT DETECTED. The SARS-CoV-2 RNA is generally detectable in upper and lower respiratory specimens during the acute phase of infection. The lowest concentration of SARS-CoV-2 viral copies this assay can detect is 250 copies / mL. A negative result does not preclude SARS-CoV-2 infection and should not be used as the sole basis for treatment or  other patient management decisions.  A negative result may occur with improper specimen collection / handling, submission of specimen other than nasopharyngeal swab, presence of viral mutation(s) within the areas targeted by this assay, and inadequate number of viral copies (<250 copies / mL). A negative result must be combined with clinical observations, patient history, and epidemiological information. Fact Sheet for Patients:   StrictlyIdeas.no Fact Sheet for Healthcare Providers: BankingDealers.co.za This test is not yet approved or cleared  by the [place] FDA and has been authorized for detection and/or diagnosis of SARS-CoV-2 by FDA under an Emergency Use Authorization (EUA).  This EUA will remain in effect (meaning this test can be used) for the duration of the COVID-19 declaration under Section 564(b)(1) of the Act, 21 U.S.C. section 360bbb-3(b)(1), unless the authorization is terminated or revoked sooner. Performed at [facility], [place] [address] [place], [place] [address]   Urine culture     Status: Abnormal (Preliminary result)   Collection Time: 08/17/19  9:05 AM   Specimen: In/Out Cath Urine  Result Value Ref Range Status   Specimen Description IN/OUT CATH URINE  Final   Special Requests NONE  Final   Culture (A)  Final    >=100,000 COLONIES/mL PROTEUS MIRABILIS SUSCEPTIBILITIES TO FOLLOW Performed at [facility], [place] [address] [place], [place] [address]    Report Status PENDING  Incomplete  Blood Culture (routine  x 2)     Status: Abnormal (Preliminary result)   Collection Time: 08/17/19  9:50 AM   Specimen: BLOOD LEFT ARM  Result Value Ref Range Status   Specimen Description BLOOD LEFT ARM  Final   Special Requests   Final    BOTTLES DRAWN AEROBIC AND ANAEROBIC Blood Culture adequate volume   Culture  Setup Time   Final    BUDDING YEAST SEEN ANAEROBIC BOTTLE ONLY CRITICAL RESULT CALLED TO,  READ BACK BY AND VERIFIED WITH: Park Breed [phone] FCP Performed at [facility], [place] [address] [place], [place] [address]    Culture YEAST (A)  Final   Report Status PENDING  Incomplete  Blood Culture ID Panel (Reflexed)     Status: Abnormal   Collection Time: 08/17/19  9:50 AM  Result Value Ref Range Status   Enterococcus species NOT DETECTED NOT DETECTED Final   Listeria monocytogenes NOT DETECTED NOT DETECTED Final   Staphylococcus species NOT DETECTED NOT DETECTED Final   Staphylococcus aureus (BCID) NOT DETECTED NOT DETECTED Final   Streptococcus species NOT DETECTED NOT DETECTED Final   Streptococcus agalactiae NOT DETECTED NOT DETECTED Final   Streptococcus pneumoniae NOT DETECTED NOT DETECTED Final   Streptococcus pyogenes NOT DETECTED NOT DETECTED Final   Acinetobacter baumannii NOT DETECTED NOT DETECTED Final   Enterobacteriaceae species NOT DETECTED NOT DETECTED Final   Enterobacter cloacae complex NOT DETECTED NOT DETECTED Final   Escherichia coli NOT DETECTED NOT DETECTED Final   Klebsiella oxytoca NOT DETECTED NOT DETECTED Final   Klebsiella pneumoniae NOT DETECTED NOT DETECTED Final   Proteus species NOT DETECTED NOT DETECTED Final   Serratia marcescens NOT DETECTED NOT DETECTED Final   Haemophilus influenzae NOT DETECTED NOT DETECTED Final   Neisseria meningitidis NOT DETECTED NOT DETECTED Final   Pseudomonas aeruginosa NOT DETECTED NOT DETECTED Final   Candida albicans NOT DETECTED NOT DETECTED Final   Candida glabrata DETECTED (A) NOT DETECTED Final    Comment: CRITICAL RESULT CALLED TO, READ BACK BY AND VERIFIED WITH: PHARMD JEREMY F. [phone] FCP    Candida krusei NOT DETECTED NOT DETECTED Final   Candida parapsilosis NOT DETECTED NOT DETECTED Final   Candida tropicalis NOT DETECTED NOT DETECTED Final    Comment: Performed at [facility], [address], [place], [place] [address]  Blood Culture (routine x 2)     Status: None  (Preliminary result)   Collection Time: 08/17/19  1:12 PM   Specimen: BLOOD LEFT ARM  Result Value Ref Range Status   Specimen Description BLOOD LEFT ARM  Final   Special Requests   Final    BOTTLES DRAWN AEROBIC ONLY Blood Culture adequate volume   Culture   Final    NO GROWTH 2 DAYS Performed at [facility], [place] [address], [place], [place] [address]    Report Status PENDING  Incomplete  MRSA PCR Screening     Status: Abnormal   Collection Time: 08/17/19  8:23 PM   Specimen: Nasal Mucosa; Nasopharyngeal  Result Value Ref Range Status   MRSA by PCR POSITIVE (A) NEGATIVE Final    Comment:        The GeneXpert MRSA Assay (FDA approved for NASAL specimens only), is one component of a comprehensive MRSA colonization surveillance program. It is not intended to diagnose MRSA infection nor to guide or monitor treatment for MRSA infections. RESULT CALLED TO, READ BACK BY AND VERIFIED WITH: J. MILLER,CHARGE RN [phone] 08/17/2019  T. TYSOR Performed at [facility], [place] [address] [place], [place] [address]     Impression/Plan:  1. 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  Picc line has been removed.  She needs a dilated eye exam.  This is her second episode with C glabrata.  Repeat blood cultures have been sent.    2.  Access - picc line out and needs line holiday.  Will consider replacing in 2-3 days.    3.  Acute renal insufficiency - now wnl, improved.
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
## 2019-08-19 NOTE — Progress Notes (Signed)
Outpatient TPN Formula  Outpatient TPN formula received from [facility] [place] on 08/18/2019.        Cristela Felt, PharmD PGY1 Pharmacy Resident [employer]: [phone]

## 2019-08-19 NOTE — Progress Notes (Signed)
Inpatient Diabetes Program Recommendations  AACE/ADA: New Consensus Statement on Inpatient Glycemic Control (2015)  Target Ranges:  Prepandial:   less than 140 mg/dL      Peak postprandial:   less than 180 mg/dL (1-2 hours)      Critically ill patients:  140 - 180 mg/dL   Lab Results  Component Value Date   GLUCAP 101 (H) 08/19/2019   HGBA1C 7.0 (H) 08/18/2019    Review of Glycemic Control Results for Gina [name], Gina [name] (MRN [id number]) as of 08/19/2019 08:53  Ref. Range 08/18/2019 09:22 08/18/2019 11:06 08/18/2019 16:13 08/18/2019 16:34 08/18/2019 21:09 08/19/2019 00:03 08/19/2019 05:50 08/19/2019 08:37  Glucose-Capillary Latest Ref Range: 70 - 99 mg/dL 182 (H) Novolog 2 units 184 (H) Novolog 2 units 59 (L) 73 69 (L) 97 73 101 (H)   Inpatient Diabetes Program Recommendations:   Noted patient 3 post correction of Novolog 2 units. -Change Novolog to sensitive tid   Thank you, Nani Gasser. Caretha Rumbaugh, RN, MSN, CDE  Diabetes Coordinator Inpatient Glycemic Control Team Team Pager [phone] (8am-5pm) 08/19/2019 8:59 AM

## 2019-08-19 NOTE — Evaluation (Signed)
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 PMH includes HTN, DM, schizophrenia, bilateral BKA, hx of perforated ulcer, colostomy bag.   Clinical Impression  Pt admitted secondary to problem above with deficits below. Pt requiring min A to roll from side to side this session for repositioning. Per pt, at baseline she uses WC and family assists with transfers. Pt reports using pampers for toileting. Pt reports she has hoyer lift, but will need to confirm with family. If she does not, feel she would benefit from one for increased safety. Feel pt is close to baseline. Requesting HHPT at d/c. Will sign off as pt with no further skilled acute PT needs. If needs change, please re-consult.     Follow Up Recommendations Home health PT;Supervision/Assistance - 24 hour(pt requesting HHPT )    Equipment Recommendations  Other (comment)(if does not have hoyer, would benefit)    Recommendations for Other Services       Precautions / Restrictions Precautions Precautions: Fall Restrictions Weight Bearing Restrictions: No Other Position/Activity Restrictions: bilateral BKA      Mobility  Bed Mobility Overal bed mobility: Needs Assistance Bed Mobility: Rolling Rolling: Min assist         General bed mobility comments: Min A to roll from side to side for repositioning.   Transfers                 General transfer comment: Family performs transfers for pt.   Ambulation/Gait                Stairs            Wheelchair Mobility    Modified Rankin (Stroke Patients Only)       Balance                                             Pertinent Vitals/Pain Pain Assessment: Faces Faces Pain Scale: Hurts little more Pain Location: end of  both residual limbs Pain Descriptors / Indicators: Sharp Pain Intervention(s): Limited activity within patient's tolerance;Monitored during session;Repositioned    Home Living Family/patient expects to be discharged to:: Private residence Living Arrangements: Children Available Help at Discharge: Family;Available 24 hours/day Type of Home: Apartment Home Access: Level entry     Home Layout: One level Home Equipment: Hospital bed;Wheelchair Administrator, sports (comment)(hoyer lift ) Additional Comments: Has an aide for 3 hours/day, 7 days/week    Prior Function Level of Independence: Needs assistance   Gait / Transfers Assistance Needed: family assists with transfers to w/c   ADL's / Homemaking Assistance Needed: daughter helps with dressing/bathing. Reports using pamper for toileting         Hand Dominance        Extremity/Trunk Assessment   Upper Extremity Assessment Upper Extremity Assessment: LUE deficits/detail LUE Deficits / Details: L hand finger contractures noted, however, baseline for pt.     Lower Extremity Assessment Lower Extremity Assessment: RLE deficits/detail;LLE deficits/detail RLE Deficits / Details: R BKA at baseline LLE Deficits / Details: L BKA at baseline        Communication   Communication: No difficulties  Cognition Arousal/Alertness: Awake/alert Behavior During Therapy: WFL for tasks assessed/performed Overall Cognitive Status: History  of cognitive impairments - at baseline                                        General Comments General comments (skin integrity, edema, etc.): Educated about desensistizing techniques for residual limbs. Also educated about repositioning to avoid pressure sores.     Exercises     Assessment/Plan    PT Assessment Patent does not need any further PT services  PT Problem List         PT Treatment Interventions      PT Goals (Current goals can be found in the Care Plan section)  Acute  Rehab PT Goals Patient Stated Goal: to go home PT Goal Formulation: With patient Time For Goal Achievement: 08/19/19 Potential to Achieve Goals: Fair    Frequency     Barriers to discharge        Co-evaluation               AM-PAC PT "6 Clicks" Mobility  Outcome Measure Help needed turning from your back to your side while in a flat bed without using bedrails?: A Little Help needed moving from lying on your back to sitting on the side of a flat bed without using bedrails?: A Little Help needed moving to and from a bed to a chair (including a wheelchair)?: Total Help needed standing up from a chair using your arms (e.g., wheelchair or bedside chair)?: Total Help needed to walk in hospital room?: Total Help needed climbing 3-5 steps with a railing? : Total 6 Click Score: 10    End of Session   Activity Tolerance: Patient tolerated treatment well Patient left: in bed;with call bell/phone within reach Nurse Communication: Mobility status PT Visit Diagnosis: Difficulty in walking, not elsewhere classified (R26.2)    Time: 8315-1761 PT Time Calculation (min) (ACUTE ONLY): 16 min   Charges:   PT Evaluation $PT Eval Moderate Complexity: 1 Mod          Gina [name], DPT  Acute Rehabilitation Services  Pager: [phone] Office: [phone]   Gina [name] 08/19/2019, 4:29 PM

## 2019-08-20 DIAGNOSIS — K632 Fistula of intestine: Secondary | ICD-10-CM

## 2019-08-20 DIAGNOSIS — D509 Iron deficiency anemia, unspecified: Secondary | ICD-10-CM

## 2019-08-20 DIAGNOSIS — B377 Candidal sepsis: Secondary | ICD-10-CM

## 2019-08-20 DIAGNOSIS — L89152 Pressure ulcer of sacral region, stage 2: Secondary | ICD-10-CM

## 2019-08-20 DIAGNOSIS — N179 Acute kidney failure, unspecified: Secondary | ICD-10-CM

## 2019-08-20 DIAGNOSIS — D638 Anemia in other chronic diseases classified elsewhere: Secondary | ICD-10-CM

## 2019-08-20 DIAGNOSIS — D500 Iron deficiency anemia secondary to blood loss (chronic): Secondary | ICD-10-CM | POA: Diagnosis present

## 2019-08-20 DIAGNOSIS — B964 Proteus (mirabilis) (morganii) as the cause of diseases classified elsewhere: Secondary | ICD-10-CM

## 2019-08-20 DIAGNOSIS — N390 Urinary tract infection, site not specified: Secondary | ICD-10-CM

## 2019-08-20 DIAGNOSIS — R6520 Severe sepsis without septic shock: Secondary | ICD-10-CM

## 2019-08-20 LAB — CBC WITH DIFFERENTIAL/PLATELET
Abs Immature Granulocytes: 0.02 10*3/uL (ref 0.00–0.07)
Basophils Absolute: 0 10*3/uL (ref 0.0–0.1)
Basophils Relative: 0 %
Eosinophils Absolute: 0.1 10*3/uL (ref 0.0–0.5)
Eosinophils Relative: 1 %
HCT: 29.8 % — ABNORMAL LOW (ref 36.0–46.0)
Hemoglobin: 9.8 g/dL — ABNORMAL LOW (ref 12.0–15.0)
Immature Granulocytes: 0 %
Lymphocytes Relative: 24 %
Lymphs Abs: 1.5 10*3/uL (ref 0.7–4.0)
MCH: 28.8 pg (ref 26.0–34.0)
MCHC: 32.9 g/dL (ref 30.0–36.0)
MCV: 87.6 fL (ref 80.0–100.0)
Monocytes Absolute: 0.8 10*3/uL (ref 0.1–1.0)
Monocytes Relative: 13 %
Neutro Abs: 3.8 10*3/uL (ref 1.7–7.7)
Neutrophils Relative %: 62 %
Platelets: 145 10*3/uL — ABNORMAL LOW (ref 150–400)
RBC: 3.4 MIL/uL — ABNORMAL LOW (ref 3.87–5.11)
RDW: 16.1 % — ABNORMAL HIGH (ref 11.5–15.5)
WBC: 6.2 10*3/uL (ref 4.0–10.5)
nRBC: 0 % (ref 0.0–0.2)

## 2019-08-20 LAB — COMPREHENSIVE METABOLIC PANEL
ALT: 21 U/L (ref 0–44)
AST: 22 U/L (ref 15–41)
Albumin: 1.8 g/dL — ABNORMAL LOW (ref 3.5–5.0)
Alkaline Phosphatase: 75 U/L (ref 38–126)
Anion gap: 7 (ref 5–15)
BUN: 36 mg/dL — ABNORMAL HIGH (ref 6–20)
CO2: 19 mmol/L — ABNORMAL LOW (ref 22–32)
Calcium: 8.9 mg/dL (ref 8.9–10.3)
Chloride: 110 mmol/L (ref 98–111)
Creatinine, Ser: 0.94 mg/dL (ref 0.44–1.00)
GFR calc Af Amer: >60 mL/min (ref >60)
GFR calc non Af Amer: >60 mL/min (ref >60)
Glucose, Bld: 104 mg/dL — ABNORMAL HIGH (ref 70–99)
Potassium: 3.5 mmol/L (ref 3.5–5.1)
Sodium: 136 mmol/L (ref 135–145)
Total Bilirubin: 0.7 mg/dL (ref 0.3–1.2)
Total Protein: 6.3 g/dL — ABNORMAL LOW (ref 6.5–8.1)

## 2019-08-20 LAB — GLUCOSE, CAPILLARY
Glucose-Capillary: 102 mg/dL — ABNORMAL HIGH (ref 70–99)
Glucose-Capillary: 102 mg/dL — ABNORMAL HIGH (ref 70–99)
Glucose-Capillary: 104 mg/dL — ABNORMAL HIGH (ref 70–99)
Glucose-Capillary: 117 mg/dL — ABNORMAL HIGH (ref 70–99)
Glucose-Capillary: 190 mg/dL — ABNORMAL HIGH (ref 70–99)
Glucose-Capillary: 97 mg/dL (ref 70–99)
Glucose-Capillary: 99 mg/dL (ref 70–99)

## 2019-08-20 LAB — MAGNESIUM: Magnesium: 1.8 mg/dL (ref 1.7–2.4)

## 2019-08-20 LAB — PHOSPHORUS: Phosphorus: 3.3 mg/dL (ref 2.5–4.6)

## 2019-08-20 LAB — URINE CULTURE: Culture: >=100000 — AB

## 2019-08-20 MED ORDER — SODIUM CHLORIDE 0.9 % IV SOLN
510.0000 mg | Freq: Once | INTRAVENOUS | Status: AC
  Administered 2019-08-20: 510 mg via INTRAVENOUS
  Filled 2019-08-20: qty 17

## 2019-08-20 MED ORDER — POTASSIUM CHLORIDE CRYS ER 20 MEQ PO TBCR
40.0000 meq | EXTENDED_RELEASE_TABLET | Freq: Once | ORAL | Status: AC
  Administered 2019-08-20: 40 meq via ORAL
  Filled 2019-08-20: qty 2

## 2019-08-20 MED ORDER — SODIUM CHLORIDE 0.9 % IV SOLN
1.0000 g | INTRAVENOUS | Status: DC
  Administered 2019-08-20: 1 g via INTRAVENOUS
  Filled 2019-08-20: qty 10

## 2019-08-20 MED ORDER — MAGNESIUM SULFATE 4 GM/100ML IV SOLN
4.0000 g | Freq: Once | INTRAVENOUS | Status: AC
  Administered 2019-08-20: 4 g via INTRAVENOUS
  Filled 2019-08-20: qty 100

## 2019-08-20 NOTE — Progress Notes (Signed)
PROGRESS NOTE    Gina [name]  ZOX:[id number] DOB: 1960-11-09 DOA: 08/17/2019 PCP: [name], MD   Chief Complaint  Patient presents with  . Fever    Brief Narrative:  Gina [name] a 59 y.o.femalewith medical history significant ofperforated prepyloric ulcerin 2020 s/pexlap with [name] patch, chronic TPN via right arm PICC, IDDM, bilateral lower quadrant drains along with enterocutaneous fistula with colostomy bag, Chronic sacral pressure ulcers stage II,HTN, schizophrenia, bilateral BKA, PVD s/p B/L BKA,sent from from home for evaluation of fever. She was admitted few times this year for sepsis, including MRSAbacteremia fungemia in [place] peritonitis in April andProteus UTIin May.Pt on TPN and she also take soft diet to improve her nutrition condition. Daughter noticed that pt has had decreased appetite for last 3-4 day and was warm to touch last night and complained about feeling of nausea but no vomiting. No abd pain. She also seemed to have burning sensation when urinated. Daughter reported that she does wound care everyday herself and did not see significant changes associated with the sacral wounds. Daughter also reported that pt Glucose has been fairly controlled at home seldom >200. ED Course:Cre1.4, CrCL 35 ml/min, Tmax 103.1, WBC WNL, LA 1.8. UA showed WBC 11-20/hpf. Xray no infiltrates. GLU >700    Assessment & Plan:   Principal Problem:   Fever Active Problems:   HTN (hypertension)   Diabetes (HCC)   Anemia of chronic disease   Sepsis ([place])   Schizophrenia ([place])   Acute kidney injury ([place])   Sacral decubitus ulcer   Normocytic anemia   S/P BKA (below knee amputation) bilateral (HCC)   Status post colostomy ([place])   Enterocutaneous fistula  1 sepsis secondary to Candida glabrata fungemia, POA Patient presented with fevers, normal white count, tachycardic, normal respiratory rate.  Blood cultures from 08/17/2019 with 1/2+ for Candida.   Repeat blood cultures from 08/19/2019 pending with no growth to date.  Patient noted to have been on TPN with a PICC line.  PICC line has been removed for line holiday pending repeat blood cultures.  TPN on hold.  CT abdomen and pelvis which was done with no significant change in size of fluid collection.  Patient seen by ophthalmology yesterday(08/19/2019) and endophthalmitis ruled out.  Continue IV Eraxis day #3.  2D echo which was done was negative for endocarditis.  Patient being seen in consultation by ID who are following and per ID if blood cultures remain negative tomorrow could likely place PICC back in in 1 to 2 days.  ID following and appreciate their input and recommendations.  2.  History of perforated gastric ulcer status post oversewing and Graham patch 01/2019/enterocutaneous fistula CT abdomen and pelvis with persistent fluid collection within ventral aspect of peritoneal cavity with continued fistula to skin surface at level of umbilicus, presumably ongoing enterocutaneous fistula, no significant change in size.  Per general surgical notes from 06/2019, appears developed enterocutaneous fistula postop from perforated ulcer treated with oversewing and [name] patch.  Wound care following.  PPI.  3.  Diabetes mellitus type 2 Hemoglobin A1c 7.0.  CBG of 99 this morning.  Sliding scale insulin.  Outpatient follow-up with PCP.  4.  Anemia/iron deficiency anemia of chronic illness Patient noted to have a drop in hemoglobin with no signs of overt bleeding felt likely dilutional.  Status post 2 units packed red blood cells hemoglobin currently stable at 9.8.  Patient noted to be having brown stool however Hemoccult was positive.  If patient continues to have  further drop in hemoglobin will need to consult with GI for further evaluation.  We will give a dose of IV iron.  Follow H&H.  5.  Acute kidney injury Resolved with hydration.  6.  Proteus mirabilis UTI Patient noted to have presented with  some dysuria which is since improving.  Urine cultures with greater than 100,000 colonies of Proteus mirabilis.  Patient improving clinically.  Patient noted to have received 2 days of IV cefepime and as such we will give 1 more day of IV Rocephin.  No further antibiotics needed.  Discussed with ID.  7.  Stage II pressure ulcer, chronic, POA Patient has been assessed by wound care.  Continue current recommendations per wound care.  Pressure Injury 05/22/19 Coccyx Bilateral Stage 4 - Full thickness tissue loss with exposed bone, tendon or muscle. Per wound nurse, healing stage IV from prior admission (Active)  05/22/19 2104  Location: Coccyx  Location Orientation: Bilateral  Staging: Stage 4 - Full thickness tissue loss with exposed bone, tendon or muscle.  Wound Description (Comments): Per wound nurse, healing stage IV from prior admission  Present on Admission: Yes     Pressure Injury 05/22/19 Knee Right;Distal Unstageable - Full thickness tissue loss in which the base of the injury is covered by slough (yellow, tan, gray, green or brown) and/or eschar (tan, brown or black) in the wound bed. Stump (Active)  05/22/19 2106  Location: Knee  Location Orientation: Right;Distal  Staging: Unstageable - Full thickness tissue loss in which the base of the injury is covered by slough (yellow, tan, gray, green or brown) and/or eschar (tan, brown or black) in the wound bed.  Wound Description (Comments): Stump  Present on Admission: Yes     Pressure Injury 05/22/19 Knee Left;Distal Unstageable - Full thickness tissue loss in which the base of the injury is covered by slough (yellow, tan, gray, green or brown) and/or eschar (tan, brown or black) in the wound bed. Stump (Active)  05/22/19 2108  Location: Knee  Location Orientation: Left;Distal  Staging: Unstageable - Full thickness tissue loss in which the base of the injury is covered by slough (yellow, tan, gray, green or brown) and/or eschar (tan,  brown or black) in the wound bed.  Wound Description (Comments): Stump  Present on Admission: Yes     Pressure Injury 05/22/19 Thigh Left;Mid Stage 2 -  Partial thickness loss of dermis presenting as a shallow open injury with a red, pink wound bed without slough. (Active)  05/22/19 2109  Location: Thigh  Location Orientation: Left;Mid  Staging: Stage 2 -  Partial thickness loss of dermis presenting as a shallow open injury with a red, pink wound bed without slough.  Wound Description (Comments):   Present on Admission: Yes     Pressure Injury 07/22/19 Sacrum Mid Stage 3 -  Full thickness tissue loss. Subcutaneous fat may be visible but bone, tendon or muscle are NOT exposed. (Active)  07/22/19   Location: Sacrum  Location Orientation: Mid  Staging: Stage 3 -  Full thickness tissue loss. Subcutaneous fat may be visible but bone, tendon or muscle are NOT exposed.  Wound Description (Comments):   Present on Admission: Yes     Pressure Injury 07/22/19 Leg Left Stage 3 -  Full thickness tissue loss. Subcutaneous fat may be visible but bone, tendon or muscle are NOT exposed. (Active)  07/22/19   Location: Leg  Location Orientation: Left  Staging: Stage 3 -  Full thickness tissue loss. Subcutaneous fat may  be visible but bone, tendon or muscle are NOT exposed.  Wound Description (Comments):   Present on Admission: Yes     Pressure Injury 07/22/19 Leg Right Stage 3 -  Full thickness tissue loss. Subcutaneous fat may be visible but bone, tendon or muscle are NOT exposed. (Active)  07/22/19   Location: Leg  Location Orientation: Right  Staging: Stage 3 -  Full thickness tissue loss. Subcutaneous fat may be visible but bone, tendon or muscle are NOT exposed.  Wound Description (Comments):   Present on Admission: Yes        DVT prophylaxis: Lovenox on hold due to anemia and positive Hemoccult. Code Status: DNR Family Communication: Updated patient.  No family at bedside. Disposition:    Status is: Inpatient    Dispo: The patient is from: Home              Anticipated d/c is to: Home with home health.              Anticipated d/c date is: To be determined.  Hopefully in 2 to 3 days.              Patient currently on IV Eraxis, Candida bacteremia, PICC line has been removed for line holiday and likely PICC line to be placed in the next 1 to 2 days if blood cultures remain negative.  Patient currently not stable for discharge.       Consultants:   Ophthalmology: Dr. Ellie Lunch 08/19/2019  ID: Dr. Megan Salon 08/17/2019  Procedures:   CT abdomen and pelvis 08/18/2019  Chest x-ray 08/17/2019  2D echo 08/18/2019  PICC line removal  2 units packed red blood cell transfusion 08/18/2019  Antimicrobials:  IV Eraxis 08/18/2019>>>>  IV cefepime 08/17/2019>>>> 08/18/2019  IV Rocephin 6/9/2021x1 dose   Subjective: Patient sitting up in bed.  States starting to feel better.  Denies any chest pain or shortness of breath.  Denies any abdominal pain.  Denies any visual difficulties at this time.  Stated had dysuria prior to admission that is slowly improving.  Tolerating current oral diet.  Objective: Vitals:   08/19/19 1942 08/19/19 2200 08/19/19 2230 08/20/19 0350  BP: (!) 189/80 138/74 135/67   Pulse: 71 71 67   Resp: 14 13 10    Temp: 97.9 F (36.6 C)   98 F (36.7 C)  TempSrc: Oral     SpO2: 99% 98% 98%   Weight:      Height:        Intake/Output Summary (Last 24 hours) at 08/20/2019 1000 Last data filed at 08/20/2019 0531 Gross per 24 hour  Intake 130 ml  Output 2450 ml  Net -2320 ml   Filed Weights   08/17/19 0900  Weight: 49.9 kg    Examination:  General exam: Appears calm and comfortable  Respiratory system: Clear to auscultation. Respiratory effort normal. Cardiovascular system: S1 & S2 heard, RRR. No JVD, murmurs, rubs, gallops or clicks.  Gastrointestinal system: Abdomen is nondistended, soft and nontender. No organomegaly or masses felt. Normal bowel  sounds heard.  Ostomy with loose stool noted. Central nervous system: Alert and oriented. No focal neurological deficits. Extremities: Status post bilateral BKA.  Skin: No rashes, lesions or ulcers Psychiatry: Judgement and insight appear normal. Mood & affect appropriate.     Data Reviewed: I have personally reviewed following labs and imaging studies  CBC: Recent Labs  Lab 08/17/19 0950 08/17/19 0950 08/17/19 1331 08/18/19 0443 08/18/19 2109 08/19/19 0619 08/20/19 0235  WBC 4.8  --   --  5.0  --  6.5 6.2  NEUTROABS 4.1  --   --  3.8  --  4.5 3.8  HGB 7.5*   < > 6.8* 5.6* 10.2* 9.9* 9.8*  HCT 24.4*   < > 20.0* 17.6* 30.5* 30.4* 29.8*  MCV 92.8  --   --  90.3  --  88.6 87.6  PLT 195  --   --  141*  --  127* 145*   < > = values in this interval not displayed.    Basic Metabolic Panel: Recent Labs  Lab 08/17/19 1312 08/17/19 1312 08/17/19 1331 08/17/19 2051 08/18/19 0443 08/19/19 0619 08/20/19 0235  NA 129*   < > 131* 131* 132* 133* 136  K 5.1   < > 5.0 5.3* 4.0 3.9 3.5  CL 97*  --   --  101 105 106 110  CO2 20*  --   --  22 22 18* 19*  GLUCOSE 602*  --   --  177* 215* 94 104*  BUN 70*  --   --  68* 63* 43* 36*  CREATININE 1.42*  --   --  1.34* 1.22* 0.93 0.94  CALCIUM 8.2*  --   --  8.2* 8.0* 8.9 8.9  MG  --   --   --   --  1.8 1.7 1.8  PHOS  --   --   --   --  3.4 3.3 3.3   < > = values in this interval not displayed.    GFR: Estimated Creatinine Clearance: 51.4 mL/min (by C-G formula based on SCr of 0.94 mg/dL).  Liver Function Tests: Recent Labs  Lab 08/17/19 0950 08/18/19 0443 08/19/19 0619 08/20/19 0235  AST 20 39 30 22  ALT 20 30 24 21   ALKPHOS 72 51 58 75  BILITOT 0.6 0.6 0.7 0.7  PROT 7.8 5.6* 6.2* 6.3*  ALBUMIN 1.9* 1.5* 2.0* 1.8*    CBG: Recent Labs  Lab 08/19/19 2109 08/19/19 2359 08/20/19 0341 08/20/19 0646 08/20/19 0801  GLUCAP 89 102* 97 99 117*     Recent Results (from the past 240 hour(s))  SARS Coronavirus 2 by RT PCR  (hospital order, performed in [facility] hospital lab) Nasopharyngeal Nasopharyngeal Swab     Status: None   Collection Time: 08/17/19  8:55 AM   Specimen: Nasopharyngeal Swab  Result Value Ref Range Status   SARS Coronavirus 2 NEGATIVE NEGATIVE Final    Comment: (NOTE) SARS-CoV-2 target nucleic acids are NOT DETECTED. The SARS-CoV-2 RNA is generally detectable in upper and lower respiratory specimens during the acute phase of infection. The lowest concentration of SARS-CoV-2 viral copies this assay can detect is 250 copies / mL. A negative result does not preclude SARS-CoV-2 infection and should not be used as the sole basis for treatment or other patient management decisions.  A negative result may occur with improper specimen collection / handling, submission of specimen other than nasopharyngeal swab, presence of viral mutation(s) within the areas targeted by this assay, and inadequate number of viral copies (<250 copies / mL). A negative result must be combined with clinical observations, patient history, and epidemiological information. Fact Sheet for Patients:   StrictlyIdeas.no Fact Sheet for Healthcare Providers: BankingDealers.co.za This test is not yet approved or cleared  by the [place] FDA and has been authorized for detection and/or diagnosis of SARS-CoV-2 by FDA under an Emergency Use Authorization (EUA).  This EUA will remain in effect (meaning this test can be used) for the duration  of the COVID-19 declaration under Section 564(b)(1) of the Act, 21 U.S.C. section 360bbb-3(b)(1), unless the authorization is terminated or revoked sooner. Performed at [facility], [place] [address], [place], [place] [address]   Urine culture     Status: Abnormal   Collection Time: 08/17/19  9:05 AM   Specimen: In/Out Cath Urine  Result Value Ref Range Status   Specimen Description IN/OUT CATH URINE  Final   Special Requests    Final    NONE Performed at [facility], [place] [address], [place], [place] [address]    Culture >=100,000 COLONIES/mL PROTEUS MIRABILIS (A)  Final   Report Status 08/20/2019 FINAL  Final   Organism ID, Bacteria PROTEUS MIRABILIS (A)  Final      Susceptibility   Proteus mirabilis - MIC*    AMPICILLIN <=2 SENSITIVE Sensitive     CEFAZOLIN <=4 SENSITIVE Sensitive     CEFTRIAXONE <=1 SENSITIVE Sensitive     CIPROFLOXACIN >=4 RESISTANT Resistant     GENTAMICIN <=1 SENSITIVE Sensitive     IMIPENEM 2 SENSITIVE Sensitive     NITROFURANTOIN 128 RESISTANT Resistant     TRIMETH/SULFA >=320 RESISTANT Resistant     AMPICILLIN/SULBACTAM <=2 SENSITIVE Sensitive     PIP/TAZO <=4 SENSITIVE Sensitive     * >=100,000 COLONIES/mL PROTEUS MIRABILIS  Blood Culture (routine x 2)     Status: Abnormal (Preliminary result)   Collection Time: 08/17/19  9:50 AM   Specimen: BLOOD LEFT ARM  Result Value Ref Range Status   Specimen Description BLOOD LEFT ARM  Final   Special Requests   Final    BOTTLES DRAWN AEROBIC AND ANAEROBIC Blood Culture adequate volume   Culture  Setup Time   Final    BUDDING YEAST SEEN IN BOTH AEROBIC AND ANAEROBIC BOTTLES CRITICAL RESULT CALLED TO, READ BACK BY AND VERIFIED WITH: Park Breed [id number] FCP Performed at [facility], [address], [place], [place] [address]    Culture CANDIDA GLABRATA (A)  Final   Report Status PENDING  Incomplete  Blood Culture ID Panel (Reflexed)     Status: Abnormal   Collection Time: 08/17/19  9:50 AM  Result Value Ref Range Status   Enterococcus species NOT DETECTED NOT DETECTED Final   Listeria monocytogenes NOT DETECTED NOT DETECTED Final   Staphylococcus species NOT DETECTED NOT DETECTED Final   Staphylococcus aureus (BCID) NOT DETECTED NOT DETECTED Final   Streptococcus species NOT DETECTED NOT DETECTED Final   Streptococcus agalactiae NOT DETECTED NOT DETECTED Final   Streptococcus pneumoniae NOT DETECTED NOT  DETECTED Final   Streptococcus pyogenes NOT DETECTED NOT DETECTED Final   Acinetobacter baumannii NOT DETECTED NOT DETECTED Final   Enterobacteriaceae species NOT DETECTED NOT DETECTED Final   Enterobacter cloacae complex NOT DETECTED NOT DETECTED Final   Escherichia coli NOT DETECTED NOT DETECTED Final   Klebsiella oxytoca NOT DETECTED NOT DETECTED Final   Klebsiella pneumoniae NOT DETECTED NOT DETECTED Final   Proteus species NOT DETECTED NOT DETECTED Final   Serratia marcescens NOT DETECTED NOT DETECTED Final   Haemophilus influenzae NOT DETECTED NOT DETECTED Final   Neisseria meningitidis NOT DETECTED NOT DETECTED Final   Pseudomonas aeruginosa NOT DETECTED NOT DETECTED Final   Candida albicans NOT DETECTED NOT DETECTED Final   Candida glabrata DETECTED (A) NOT DETECTED Final    Comment: CRITICAL RESULT CALLED TO, READ BACK BY AND VERIFIED WITH: PHARMD JEREMY F. [id number] FCP    Candida krusei NOT  DETECTED NOT DETECTED Final   Candida parapsilosis NOT DETECTED NOT DETECTED Final   Candida tropicalis NOT DETECTED NOT DETECTED Final    Comment: Performed at [facility], [place] [address] [place], [place] [address]  Blood Culture (routine x 2)     Status: None (Preliminary result)   Collection Time: 08/17/19  1:12 PM   Specimen: BLOOD LEFT ARM  Result Value Ref Range Status   Specimen Description BLOOD LEFT ARM  Final   Special Requests   Final    BOTTLES DRAWN AEROBIC ONLY Blood Culture adequate volume   Culture  Setup Time   Final    AEROBIC BOTTLE ONLY YEAST CRITICAL VALUE NOTED.  VALUE IS CONSISTENT WITH PREVIOUSLY REPORTED AND CALLED VALUE.    Culture   Final    NO GROWTH 3 DAYS Performed at [facility], [place] [address], [place], [place] [address]    Report Status PENDING  Incomplete  MRSA PCR Screening     Status: Abnormal   Collection Time: 08/17/19  8:23 PM   Specimen: Nasal Mucosa; Nasopharyngeal  Result Value Ref Range Status   MRSA by PCR POSITIVE  (A) NEGATIVE Final    Comment:        The GeneXpert MRSA Assay (FDA approved for NASAL specimens only), is one component of a comprehensive MRSA colonization surveillance program. It is not intended to diagnose MRSA infection nor to guide or monitor treatment for MRSA infections. RESULT CALLED TO, READ BACK BY AND VERIFIED WITH: J. MILLER,CHARGE RN [id number] 08/17/2019 Mena Goes Performed at [facility], [place] [address], [place], [place] [address]   Culture, blood (routine x 2)     Status: None (Preliminary result)   Collection Time: 08/19/19  5:56 AM   Specimen: BLOOD LEFT HAND  Result Value Ref Range Status   Specimen Description BLOOD LEFT HAND  Final   Special Requests   Final    BOTTLES DRAWN AEROBIC AND ANAEROBIC Blood Culture adequate volume   Culture   Final    NO GROWTH 1 DAY Performed at [facility], [place] [address] [place], [place] [address]    Report Status PENDING  Incomplete  Culture, blood (routine x 2)     Status: None (Preliminary result)   Collection Time: 08/19/19  5:56 AM   Specimen: BLOOD LEFT HAND  Result Value Ref Range Status   Specimen Description BLOOD LEFT HAND  Final   Special Requests   Final    BOTTLES DRAWN AEROBIC AND ANAEROBIC Blood Culture adequate volume   Culture   Final    NO GROWTH 1 DAY Performed at [facility], [place] [address] [place], [place] [address]    Report Status PENDING  Incomplete         Radiology Studies: CT ABDOMEN PELVIS W CONTRAST  Result Date: 08/18/2019 CLINICAL DATA:  Sepsis EXAM: CT ABDOMEN AND PELVIS WITH CONTRAST TECHNIQUE: Multidetector CT imaging of the abdomen and pelvis was performed using the standard protocol following bolus administration of intravenous contrast. CONTRAST:  183mL OMNIPAQUE IOHEXOL 300 MG/ML  SOLN COMPARISON:  07/05/2019 FINDINGS: Lower chest: There are trace bilateral pleural effusions with minimal bilateral dependent lower lobe atelectasis. Hepatobiliary: Periportal edema  is again identified unchanged. Calcified gallstones are identified, with trace pericholecystic fluid. No gallbladder wall thickening. Pancreas: Unremarkable. No pancreatic ductal dilatation or surrounding inflammatory changes. Spleen: Normal in size without focal abnormality. Adrenals/Urinary Tract: Adrenal glands are unremarkable. Kidneys are normal, without  renal calculi, focal lesion, or hydronephrosis. Bladder is unremarkable. Stomach/Bowel: No bowel obstruction or ileus. Moderate retained stool within the colon. No bowel wall thickening or inflammatory change. Vascular/Lymphatic: Aortic atherosclerosis. No enlarged abdominal or pelvic lymph nodes. Reproductive: Uterus and bilateral adnexa are unremarkable. Other: Elongated gas and fluid collection within the ventral aspect of the peritoneal cavity is again noted, without significant change in size. This measures up to approximately 9 mm in thickness. There is evidence of fistula to the skin surface at the level of the umbilicus with overlying dressing in place. The percutaneous drainage catheter seen previously has been removed in the interim. Musculoskeletal: No acute or destructive bony lesions. Reconstructed images demonstrate no additional findings. IMPRESSION: 1. Continued fluid collection within the ventral aspect of the peritoneal cavity, with continued fistula to the skin surface at the level the umbilicus. 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 No significant change in size of the fluid collection despite removal of the percutaneous drainage catheter. 2. Stable periportal edema, nonspecific. 3. Cholelithiasis without cholecystitis. 4. Small bilateral pleural effusions. Electronically Signed   By: Randa Ngo M.D.   On: 08/18/2019 21:16   ECHOCARDIOGRAM COMPLETE  Result Date: 08/18/2019    ECHOCARDIOGRAM REPORT   Patient Name:   MARGRIT MINNER Date of Exam: 08/18/2019 Medical Rec #:  [id number]        Height:       62.0 in Accession #:    [id number]      Weight:       110.0 lb Date of Birth:  May 15, 1960        BSA:          1.483 m Patient Age:    47 years        BP:           133/66 mmHg Patient Gender: F               HR:           72 bpm. Exam Location:  Inpatient Procedure: 2D Echo, Color Doppler and Cardiac Doppler Indications:    Candidemia  History:        Patient has prior history of Echocardiogram examinations, most                 recent 05/27/2019. Risk Factors:Hypertension and Diabetes.  Sonographer:    Raquel Sarna Senior RDCS Referring Phys: [phone] A CALDWELL [place]  1. Left ventricular ejection fraction, by estimation, is 55 to 60%. The left ventricle has normal function. The left ventricle has no regional wall motion abnormalities. Left ventricular diastolic parameters are consistent with Grade I diastolic dysfunction (impaired relaxation).  2. Right ventricular systolic function is normal. The right ventricular size is normal. There is normal pulmonary artery systolic pressure.  3. Left atrial size was moderately dilated.  4. The mitral valve is abnormal. Trivial mitral valve regurgitation.  5. The aortic valve is tricuspid. Aortic valve regurgitation is not visualized.  6. The inferior vena cava is normal in size with greater than 50% respiratory variability, suggesting right atrial pressure of 3 mmHg. Comparison(s): No significant change from prior study. Conclusion(s)/Recommendation(s): No evidence of valvular vegetations on this transthoracic echocardiogram. Would recommend a transesophageal echocardiogram to exclude infective endocarditis if clinically indicated. FINDINGS  Left Ventricle: Left ventricular ejection fraction, by estimation, is 55 to 60%. The left ventricle has normal function. The left ventricle has no regional wall motion abnormalities. The left ventricular internal cavity size was  normal in size. There is  no left ventricular hypertrophy. Left ventricular diastolic  parameters are consistent with Grade I diastolic dysfunction (impaired relaxation). Indeterminate filling pressures. Right Ventricle: The right ventricular size is normal. No increase in right ventricular wall thickness. Right ventricular systolic function is normal. There is normal pulmonary artery systolic pressure. The tricuspid regurgitant velocity is 1.55 m/s, and  with an assumed right atrial pressure of 3 mmHg, the estimated right ventricular systolic pressure is 26.8 mmHg. Left Atrium: Left atrial size was moderately dilated. Right Atrium: Right atrial size was normal in size. Pericardium: There is no evidence of pericardial effusion. Mitral Valve: The mitral valve is abnormal. There is mild thickening of the mitral valve leaflet(s). Trivial mitral valve regurgitation. Tricuspid Valve: The tricuspid valve is grossly normal. Tricuspid valve regurgitation is trivial. Aortic Valve: The aortic valve is tricuspid. Aortic valve regurgitation is not visualized. Pulmonic Valve: The pulmonic valve was not well visualized. Pulmonic valve regurgitation is not visualized. Aorta: The aortic root and ascending aorta are structurally normal, with no evidence of dilitation. Venous: The inferior vena cava is normal in size with greater than 50% respiratory variability, suggesting right atrial pressure of 3 mmHg. IAS/Shunts: No atrial level shunt detected by color flow Doppler.  LEFT VENTRICLE PLAX 2D LVIDd:         4.10 cm      Diastology LVIDs:         3.30 cm      LV e' lateral:   7.83 cm/s LV PW:         0.80 cm      LV E/e' lateral: 10.9 LV IVS:        0.80 cm      LV e' medial:    5.98 cm/s LVOT diam:     1.80 cm      LV E/e' medial:  14.3 LV SV:         57 LV SV Index:   39 LVOT Area:     2.54 cm  LV Volumes (MOD) LV vol d, MOD A2C: 114.0 ml LV vol d, MOD A4C: 92.9 ml LV vol s, MOD A2C: 55.5 ml LV vol s, MOD A4C: 43.1 ml LV SV MOD A2C:     58.5 ml LV SV MOD A4C:     92.9 ml LV SV MOD BP:      60.9 ml RIGHT VENTRICLE  RV S prime:     8.38 cm/s TAPSE (M-mode): 1.7 cm LEFT ATRIUM             Index       RIGHT ATRIUM          Index LA diam:        3.40 cm 2.29 cm/m  RA Area:     8.97 cm LA Vol (A2C):   48.7 ml 32.84 ml/m RA Volume:   16.80 ml 11.33 ml/m LA Vol (A4C):   62.3 ml 42.02 ml/m LA Biplane Vol: 56.9 ml 38.37 ml/m  AORTIC VALVE LVOT Vmax:   95.30 cm/s LVOT Vmean:  61.800 cm/s LVOT VTI:    0.225 m  AORTA Ao Root diam: 2.60 cm Ao Asc diam:  2.80 cm MITRAL VALVE                TRICUSPID VALVE MV Area (PHT): 3.72 cm     TR Peak grad:   9.6 mmHg MV Decel Time: 204 msec     TR Vmax:  155.00 cm/s MV E velocity: 85.30 cm/s MV A velocity: 104.00 cm/s  SHUNTS MV E/A ratio:  0.82         Systemic VTI:  0.22 m                             Systemic Diam: 1.80 cm Lyman Bishop MD Electronically signed by Lyman Bishop MD Signature Date/Time: 08/18/2019/12:56:19 PM    Final         Scheduled Meds: . ascorbic acid  500 mg Oral BID  . Chlorhexidine Gluconate Cloth  6 each Topical Daily  . escitalopram  20 mg Oral Daily  . feeding supplement (ENSURE ENLIVE)  237 mL Oral TID BM  . ferrous sulfate  300 mg Oral Q breakfast  . gabapentin  300 mg Oral BID  . insulin aspart  0-5 Units Subcutaneous QHS  . insulin aspart  0-9 Units Subcutaneous TID WC  . melatonin  3 mg Oral QHS  . mupirocin ointment  1 application Nasal BID  . pantoprazole  40 mg Oral BID  . potassium chloride  40 mEq Oral Once   Continuous Infusions: . anidulafungin 78 mL/hr at 08/19/19 1535  . cefTRIAXone (ROCEPHIN)  IV    . magnesium sulfate bolus IVPB       LOS: 3 days    Time spent: 40 minutes    Irine Seal, MD [facility]   To contact the attending provider between 7A-7P or the covering provider during after hours 7P-7A, please log into the web site [email or web address] and access using universal [place] password for that web site. If you do not have the password, please call the hospital operator.  08/20/2019, 10:00 AM

## 2019-08-20 NOTE — Progress Notes (Signed)
[facility]   Reason for visit: Follow up on Candidemia  Interval History: feels better overall.   Had her eye exam yesterday by Dr. Ellie Lunch and no endophthalmitis noted.   No fever, no chills.  Eraxis day 3  Physical Exam: Constitutional:  Vitals:   08/19/19 2230 08/20/19 0350  BP: 135/67   Pulse: 67   Resp: 10   Temp:  98 F (36.7 C)  SpO2: 98%    patient appears in NAD Respiratory: Normal respiratory effort; CTA B Cardiovascular: RRR GI: soft, nt, nd  Review of Systems: Constitutional: negative for fevers and chills Gastrointestinal: negative for diarrhea  Lab Results  Component Value Date   WBC 6.2 08/20/2019   HGB 9.8 (L) 08/20/2019   HCT 29.8 (L) 08/20/2019   MCV 87.6 08/20/2019   PLT 145 (L) 08/20/2019    Lab Results  Component Value Date   CREATININE 0.94 08/20/2019   BUN 36 (H) 08/20/2019   NA 136 08/20/2019   K 3.5 08/20/2019   CL 110 08/20/2019   CO2 19 (L) 08/20/2019    Lab Results  Component Value Date   ALT 21 08/20/2019   AST 22 08/20/2019   ALKPHOS 75 08/20/2019     Microbiology: Recent Results (from the past 240 hour(s))  SARS Coronavirus 2 by RT PCR (hospital order, performed in [place] hospital lab) Nasopharyngeal Nasopharyngeal Swab     Status: None   Collection Time: 08/17/19  8:55 AM   Specimen: Nasopharyngeal Swab  Result Value Ref Range Status   SARS Coronavirus 2 NEGATIVE NEGATIVE Final    Comment: (NOTE) SARS-CoV-2 target nucleic acids are NOT DETECTED. The SARS-CoV-2 RNA is generally detectable in upper and lower respiratory specimens during the acute phase of infection. The lowest concentration of SARS-CoV-2 viral copies this assay can detect is 250 copies / mL. A negative result does not preclude SARS-CoV-2 infection and should not be used as the sole basis for treatment or other patient management decisions.  A negative result may occur with improper specimen collection / handling,  submission of specimen other than nasopharyngeal swab, presence of viral mutation(s) within the areas targeted by this assay, and inadequate number of viral copies (<250 copies / mL). A negative result must be combined with clinical observations, patient history, and epidemiological information. Fact Sheet for Patients:   StrictlyIdeas.no Fact Sheet for Healthcare Providers: BankingDealers.co.za This test is not yet approved or cleared  by the [place] FDA and has been authorized for detection and/or diagnosis of SARS-CoV-2 by FDA under an Emergency Use Authorization (EUA).  This EUA will remain in effect (meaning this test can be used) for the duration of the COVID-19 declaration under Section 564(b)(1) of the Act, 21 U.S.C. section 360bbb-3(b)(1), unless the authorization is terminated or revoked sooner. Performed at [facility], [place] [address] [place], [place] [address]   Urine culture     Status: Abnormal   Collection Time: 08/17/19  9:05 AM   Specimen: In/Out Cath Urine  Result Value Ref Range Status   Specimen Description IN/OUT CATH URINE  Final   Special Requests   Final    NONE Performed at [facility], [place] [address], [place], [place] [address]    Culture >=100,000 COLONIES/mL PROTEUS MIRABILIS (A)  Final   Report Status 08/20/2019 FINAL  Final   Organism ID, Bacteria PROTEUS MIRABILIS (A)  Final      Susceptibility   Proteus mirabilis -  MIC*    AMPICILLIN <=2 SENSITIVE Sensitive     CEFAZOLIN <=4 SENSITIVE Sensitive     CEFTRIAXONE <=1 SENSITIVE Sensitive     CIPROFLOXACIN >=4 RESISTANT Resistant     GENTAMICIN <=1 SENSITIVE Sensitive     IMIPENEM 2 SENSITIVE Sensitive     NITROFURANTOIN 128 RESISTANT Resistant     TRIMETH/SULFA >=320 RESISTANT Resistant     AMPICILLIN/SULBACTAM <=2 SENSITIVE Sensitive     PIP/TAZO <=4 SENSITIVE Sensitive     * >=100,000 COLONIES/mL PROTEUS MIRABILIS  Blood  Culture (routine x 2)     Status: Abnormal (Preliminary result)   Collection Time: 08/17/19  9:50 AM   Specimen: BLOOD LEFT ARM  Result Value Ref Range Status   Specimen Description BLOOD LEFT ARM  Final   Special Requests   Final    BOTTLES DRAWN AEROBIC AND ANAEROBIC Blood Culture adequate volume   Culture  Setup Time   Final    BUDDING YEAST SEEN IN BOTH AEROBIC AND ANAEROBIC BOTTLES CRITICAL RESULT CALLED TO, READ BACK BY AND VERIFIED WITH: Park Breed [phone] FCP Performed at [facility], [address] [place], [place] [address]    Culture CANDIDA GLABRATA (A)  Final   Report Status PENDING  Incomplete  Blood Culture ID Panel (Reflexed)     Status: Abnormal   Collection Time: 08/17/19  9:50 AM  Result Value Ref Range Status   Enterococcus species NOT DETECTED NOT DETECTED Final   Listeria monocytogenes NOT DETECTED NOT DETECTED Final   Staphylococcus species NOT DETECTED NOT DETECTED Final   Staphylococcus aureus (BCID) NOT DETECTED NOT DETECTED Final   Streptococcus species NOT DETECTED NOT DETECTED Final   Streptococcus agalactiae NOT DETECTED NOT DETECTED Final   Streptococcus pneumoniae NOT DETECTED NOT DETECTED Final   Streptococcus pyogenes NOT DETECTED NOT DETECTED Final   Acinetobacter baumannii NOT DETECTED NOT DETECTED Final   Enterobacteriaceae species NOT DETECTED NOT DETECTED Final   Enterobacter cloacae complex NOT DETECTED NOT DETECTED Final   Escherichia coli NOT DETECTED NOT DETECTED Final   Klebsiella oxytoca NOT DETECTED NOT DETECTED Final   Klebsiella pneumoniae NOT DETECTED NOT DETECTED Final   Proteus species NOT DETECTED NOT DETECTED Final   Serratia marcescens NOT DETECTED NOT DETECTED Final   Haemophilus influenzae NOT DETECTED NOT DETECTED Final   Neisseria meningitidis NOT DETECTED NOT DETECTED Final   Pseudomonas aeruginosa NOT DETECTED NOT DETECTED Final   Candida albicans NOT DETECTED NOT DETECTED Final   Candida glabrata  DETECTED (A) NOT DETECTED Final    Comment: CRITICAL RESULT CALLED TO, READ BACK BY AND VERIFIED WITH: PHARMD JEREMY F. [phone] FCP    Candida krusei NOT DETECTED NOT DETECTED Final   Candida parapsilosis NOT DETECTED NOT DETECTED Final   Candida tropicalis NOT DETECTED NOT DETECTED Final    Comment: Performed at [facility], [address] [place], [place] [address]  Blood Culture (routine x 2)     Status: None (Preliminary result)   Collection Time: 08/17/19  1:12 PM   Specimen: BLOOD LEFT ARM  Result Value Ref Range Status   Specimen Description BLOOD LEFT ARM  Final   Special Requests   Final    BOTTLES DRAWN AEROBIC ONLY Blood Culture adequate volume   Culture  Setup Time   Final    AEROBIC BOTTLE ONLY YEAST CRITICAL VALUE NOTED.  VALUE IS CONSISTENT WITH PREVIOUSLY REPORTED AND CALLED VALUE.    Culture   Final  NO GROWTH 3 DAYS Performed at [facility], [place] [address], [place], [place] [address]    Report Status PENDING  Incomplete  MRSA PCR Screening     Status: Abnormal   Collection Time: 08/17/19  8:23 PM   Specimen: Nasal Mucosa; Nasopharyngeal  Result Value Ref Range Status   MRSA by PCR POSITIVE (A) NEGATIVE Final    Comment:        The GeneXpert MRSA Assay (FDA approved for NASAL specimens only), is one component of a comprehensive MRSA colonization surveillance program. It is not intended to diagnose MRSA infection nor to guide or monitor treatment for MRSA infections. RESULT CALLED TO, READ BACK BY AND VERIFIED WITH: J. MILLER,CHARGE RN [id number] 08/17/2019 Mena Goes Performed at [facility], [place] [address], [place], [place] [address]   Culture, blood (routine x 2)     Status: None (Preliminary result)   Collection Time: 08/19/19  5:56 AM   Specimen: BLOOD LEFT HAND  Result Value Ref Range Status   Specimen Description BLOOD LEFT HAND  Final   Special Requests   Final    BOTTLES DRAWN AEROBIC AND ANAEROBIC Blood Culture adequate  volume   Culture   Final    NO GROWTH 1 DAY Performed at [facility], [place] [address], [place], [place] [address]    Report Status PENDING  Incomplete  Culture, blood (routine x 2)     Status: None (Preliminary result)   Collection Time: 08/19/19  5:56 AM   Specimen: BLOOD LEFT HAND  Result Value Ref Range Status   Specimen Description BLOOD LEFT HAND  Final   Special Requests   Final    BOTTLES DRAWN AEROBIC AND ANAEROBIC Blood Culture adequate volume   Culture   Final    NO GROWTH 1 DAY Performed at [facility], [place] [address], [place], [place] [address]    Report Status PENDING  Incomplete    Impression/Plan:  1. 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  Eye exam ok, appreciated Dr. Ellie Lunch.  Now 3/4 bottles positive with C glabrata.  Repeat blood cultures remain ngtd.  If blood cultures remain negative tomorrow, ok to replace picc line.    2.  Access - ok for picc line tomorrow if cultures remain negative  3.  Urine culture positive - no significant complaints.  UA with protein, hgb.  Urine culture with same Proteus c/w colonization.   She has already received cefepime x 2 days and will get one dose of ceftriaxone today.  No systemic symptoms so no indication to continue after that.

## 2019-08-20 NOTE — TOC Transition Note (Signed)
Transition of Care [facility]) - CM/SW Discharge Note   Patient Details  Name: Gina [name] MRN: [id number] Date of Birth: Jun 15, 1960  Transition of [facility]) CM/SW Contact:  Zenon Mayo, RN Phone Number: 08/20/2019, 3:38 PM   Clinical Narrative:    [facility] will supply HHPT, HHAIDE , soc will begin 24 to 48 hrs post dc.  [facility] will supply the  [facility] for TPN.  TOC will cont to follow for dc needs.   Final next level of care: [place] Barriers to Discharge: Continued Medical Work up   Patient Goals and CMS Choice Patient states their goals for this hospitalization and ongoing recovery are:: get better CMS Medicare.gov Compare Post Acute Care list provided to:: Patient Choice offered to / list presented to : Patient  Discharge Placement                       Discharge Plan and Services   Discharge Planning Services: CM Consult Post Acute Care Choice: Home Health            DME Agency: NA       HH Arranged: PT, Nurse's Aide [place] Agency: [place] Date [place]: 08/20/19 Time [place]: 1538 Representative spoke with at [place]: [place] ([place]) Interventions     Readmission Risk Interventions Readmission Risk Prevention Plan 08/19/2019 02/10/2019 11/07/2017  Post Dischage Appt - - Patient refused  Medication Screening - - Complete  Transportation Screening Complete Complete Complete  PCP follow-up - - Patient refused  PCP or Specialist Appt within 3-5 Days - Not Complete -  Not Complete comments - DC date unknown- pt established with PCP -  [place] or [place] - Complete -  Social Work Consult for [place] Planning/Counseling - Not Complete -  SW consult not completed comments - awaiting call back from daughter and APS -  Palliative Care Screening - Not Complete -  Palliative Care Screening Not Complete Comments - pending need -  Medication Review (RN  Care Manager) Complete Referral to [place] or Home Care Consult Complete - -  SW Recovery Care/Counseling Consult Complete - -  Palliative Care Screening Not Applicable - -  [place] Not Applicable - -  Some recent data might be hidden

## 2019-08-20 NOTE — Plan of Care (Signed)

## 2019-08-21 ENCOUNTER — Inpatient Hospital Stay: Payer: Self-pay

## 2019-08-21 DIAGNOSIS — Z89512 Acquired absence of left leg below knee: Secondary | ICD-10-CM

## 2019-08-21 DIAGNOSIS — A419 Sepsis, unspecified organism: Secondary | ICD-10-CM

## 2019-08-21 DIAGNOSIS — Z89511 Acquired absence of right leg below knee: Secondary | ICD-10-CM

## 2019-08-21 LAB — CBC WITH DIFFERENTIAL/PLATELET
Abs Immature Granulocytes: 0.03 10*3/uL (ref 0.00–0.07)
Basophils Absolute: 0 10*3/uL (ref 0.0–0.1)
Basophils Relative: 0 %
Eosinophils Absolute: 0.1 10*3/uL (ref 0.0–0.5)
Eosinophils Relative: 1 %
HCT: 29.7 % — ABNORMAL LOW (ref 36.0–46.0)
Hemoglobin: 9.6 g/dL — ABNORMAL LOW (ref 12.0–15.0)
Immature Granulocytes: 0 %
Lymphocytes Relative: 28 %
Lymphs Abs: 2.3 10*3/uL (ref 0.7–4.0)
MCH: 28.3 pg (ref 26.0–34.0)
MCHC: 32.3 g/dL (ref 30.0–36.0)
MCV: 87.6 fL (ref 80.0–100.0)
Monocytes Absolute: 0.8 10*3/uL (ref 0.1–1.0)
Monocytes Relative: 10 %
Neutro Abs: 5.2 10*3/uL (ref 1.7–7.7)
Neutrophils Relative %: 61 %
Platelets: 163 10*3/uL (ref 150–400)
RBC: 3.39 MIL/uL — ABNORMAL LOW (ref 3.87–5.11)
RDW: 16 % — ABNORMAL HIGH (ref 11.5–15.5)
WBC: 8.4 10*3/uL (ref 4.0–10.5)
nRBC: 0 % (ref 0.0–0.2)

## 2019-08-21 LAB — CULTURE, BLOOD (ROUTINE X 2)
Special Requests: ADEQUATE
Special Requests: ADEQUATE

## 2019-08-21 LAB — GLUCOSE, CAPILLARY
Glucose-Capillary: 73 mg/dL (ref 70–99)
Glucose-Capillary: 95 mg/dL (ref 70–99)
Glucose-Capillary: 98 mg/dL (ref 70–99)
Glucose-Capillary: 270 mg/dL — ABNORMAL HIGH (ref 70–99)
Glucose-Capillary: 273 mg/dL — ABNORMAL HIGH (ref 70–99)

## 2019-08-21 LAB — BASIC METABOLIC PANEL
Anion gap: 5 (ref 5–15)
BUN: 29 mg/dL — ABNORMAL HIGH (ref 6–20)
CO2: 19 mmol/L — ABNORMAL LOW (ref 22–32)
Calcium: 8.8 mg/dL — ABNORMAL LOW (ref 8.9–10.3)
Chloride: 110 mmol/L (ref 98–111)
Creatinine, Ser: 1.08 mg/dL — ABNORMAL HIGH (ref 0.44–1.00)
GFR calc Af Amer: >60 mL/min (ref >60)
GFR calc non Af Amer: 57 mL/min — ABNORMAL LOW (ref >60)
Glucose, Bld: 73 mg/dL (ref 70–99)
Potassium: 4.1 mmol/L (ref 3.5–5.1)
Sodium: 134 mmol/L — ABNORMAL LOW (ref 135–145)

## 2019-08-21 LAB — MAGNESIUM: Magnesium: 2.6 mg/dL — ABNORMAL HIGH (ref 1.7–2.4)

## 2019-08-21 MED ORDER — AMLODIPINE BESYLATE 5 MG PO TABS
5.0000 mg | ORAL_TABLET | Freq: Every day | ORAL | Status: DC
  Administered 2019-08-21 – 2019-08-29 (×9): 5 mg via ORAL
  Filled 2019-08-21 (×9): qty 1

## 2019-08-21 MED ORDER — SODIUM CHLORIDE 0.9% FLUSH: 10.0000 mL | INTRAVENOUS | Status: DC | PRN

## 2019-08-21 MED ORDER — TRAVASOL 10 % IV SOLN
INTRAVENOUS | Status: AC
  Filled 2019-08-21: qty 900

## 2019-08-21 MED ORDER — SODIUM CHLORIDE 0.9% FLUSH
10.0000 mL | Freq: Two times a day (BID) | INTRAVENOUS | Status: DC
  Administered 2019-08-21 – 2019-08-28 (×12): 10 mL

## 2019-08-21 NOTE — Progress Notes (Signed)
.  PHARMACY - TOTAL PARENTERAL NUTRITION CONSULT NOTE   Indication: Fistula  Patient Measurements: Height: 5\' 2"  (157.5 cm) Weight: 49.9 kg (110 lb) IBW/kg (Calculated) : 50.1 TPN AdjBW (KG): 49.9 Body mass index is 20.12 kg/m. Usual Weight: 53-60 kg  Assessment: 59 yo female presents on 08/17/2019 with fever, dysuria, and decreased intake. On presentation, patient found to have blood glucose of > 600 and started on insulin gtt. Prior to admission patient is on chronic cyclic TPN via R arm PICC and soft diet. TPN was initiated in 02/2019 after perforated prepyloric ulcer which was repaired with a [name] patch and continued to leak. Patient currently has an Chief Strategy Officer in place from January 2021 due to [facility] fistula.   Per patient, decreased appetite and po intake for previous 3-4 days prior to presentation. She reports transitioning from eat a normal diet of foods such as grits, chicken, tuna, chicken noodle soup, chips, jello, and pudding to full liquid diet containing mainly soup. Per outpatient records, she is on a 14 hour TPN cycle   TPN was held on admission until today due to candidemia which is being treated with anidulafungin. PICC line was pulled and is being reinserted today. Pharmacy consulted to resume TPN today (on 08/21/19)  Glucose / Insulin: Hx of DM (controlled by diet outpt). Hyperglycemia on admission started on insulin gtt but now stable off insulin drip for days. CBGs 73-104 on sSSI  Electrolytes: Na 134. Corrected Ca 10.3. Mg 2.6. Other electrolytes wnl  Renal: Scr 1.0.8, baseline Scr 0.6-0.8. CO2 19. No UOP documented.  LFTs / TGs: AST/ALT wnl. Tbili wnl. TG 133. Prealbumin / albumin: prealbumin 5.1, albumin 1.5. Intake / Output; MIVF: No MIVF GI Imaging:  Surgeries / Procedures:   Central access: PICC planned for today  TPN start date: Chronic TPN prior to admission since 02/2019  Nutritional Goals (per outpatient records): kCal: 1471 kcal, Protein: 93 g, Fluid:  1800 Goal TPN rate is 60 mL/hr (provides 70.5g of protein and 1306 kcals per day)  Current Nutrition:  NPO  Starting TPN at 1800 on 6/10   Plan:  Start 14 hour cycle with TPN at 1800 as per home TPN formula. Electrolytes in TPN: 151mEq/L of Na, 32mEq/L of K, 4.8mEq/L of Ca, 39mEq/L of Mg, and 78mmol/L of Phos. Cl:Ac ratio 1:2 Add standard MVI and trace elements to TPN Continue Sensitive q4h SSI and adjust as needed  Monitor TPN labs on Mon/Thurs   Albertina Parr, PharmD., BCPS, BCCCP Clinical Pharmacist Clinical phone for 08/21/19 until 3:30pm: [phone] If after 3:30pm, please refer to [facility] for unit-specific pharmacist

## 2019-08-21 NOTE — Plan of Care (Signed)

## 2019-08-21 NOTE — Progress Notes (Signed)
PHARMACY CONSULT NOTE FOR:  OUTPATIENT  PARENTERAL ANTIBIOTIC THERAPY (OPAT)  Indication: Candida glabrata bacteremia  Regimen: Anidulafungin 100 mg daily End date: 08/31/2019  IV antibiotic discharge orders are pended. To discharging provider:  please sign these orders via discharge navigator,  Select New Orders & click on the button choice - Manage This Unsigned Work.     Thank you for allowing pharmacy to be a part of this patient's care.  Phillis Haggis 08/21/2019, 9:22 AM

## 2019-08-21 NOTE — Progress Notes (Addendum)
PROGRESS NOTE    Gina [name]  GQQ:[id number] DOB: 21-Nov-1960 DOA: 08/17/2019 PCP: [name], MD   Chief Complaint  Patient presents with  . Fever    Brief Narrative:  Gina [name] a 59 y.o.femalewith medical history significant ofperforated prepyloric ulcerin 2020 s/pexlap with [name] patch, chronic TPN via right arm PICC, IDDM, bilateral lower quadrant drains along with enterocutaneous fistula with colostomy bag, Chronic sacral pressure ulcers stage II,HTN, schizophrenia, bilateral BKA, PVD s/p B/L BKA,sent from from home for evaluation of fever. She was admitted few times this year for sepsis, including MRSAbacteremia fungemia in [place] peritonitis in April andProteus UTIin May.Pt on TPN and she also take soft diet to improve her nutrition condition. Daughter noticed that pt has had decreased appetite for last 3-4 day and was warm to touch last night and complained about feeling of nausea but no vomiting. No abd pain. She also seemed to have burning sensation when urinated. Daughter reported that she does wound care everyday herself and did not see significant changes associated with the sacral wounds. Daughter also reported that pt Glucose has been fairly controlled at home seldom >200. ED Course:Cre1.4, CrCL 35 ml/min, Tmax 103.1, WBC WNL, LA 1.8. UA showed WBC 11-20/hpf. Xray no infiltrates. GLU >700    Assessment & Plan:   Principal Problem:   Fever Active Problems:   HTN (hypertension)   Diabetes (HCC)   Anemia of chronic disease   Sepsis ([place])   Schizophrenia ([place])   Acute kidney injury ([place])   Sacral decubitus ulcer   Normocytic anemia   S/P BKA (below knee amputation) bilateral (HCC)   Status post colostomy (HCC)   Enterocutaneous fistula   Urinary tract infection due to Proteus   Iron deficiency anemia  1 sepsis secondary to Candida glabrata fungemia, POA Patient presented with fevers, normal white count, tachycardic, normal  respiratory rate.  Blood cultures from 08/17/2019 with 1/2+ for Candida.  Repeat blood cultures from 08/19/2019 pending with no growth to date.  Patient noted to have been on TPN with a PICC line.  PICC line has been removed for line holiday pending repeat blood cultures.  TPN on hold.  CT abdomen and pelvis which was done with no significant change in size of fluid collection.  Patient seen by ophthalmology yesterday(08/19/2019) and endophthalmitis ruled out.  Continue IV Eraxis day #4/14.  2D echo which was done was negative for endocarditis.  Patient being seen in consultation by ID who are following and per ID as blood cultures are still negative PICC line has been ordered to be placed.  Patient will need a total of 14 days of treatment per ID recommendations.  ID following and I appreciate the input and recommendations.    2.  History of perforated gastric ulcer status post oversewing and Graham patch 01/2019/enterocutaneous fistula CT abdomen and pelvis with persistent fluid collection within ventral aspect of peritoneal cavity with continued fistula to skin surface at level of umbilicus, presumably ongoing enterocutaneous fistula, no significant change in size.  Per general surgical notes from 06/2019, appears developed enterocutaneous fistula postop from perforated ulcer treated with oversewing and [name] patch.  Wound care following.  PPI.  3.  Diabetes mellitus type 2 Hemoglobin A1c 7.0.  CBG of 73 this morning.  Sliding scale insulin.  Outpatient follow-up with PCP.  4.  Anemia/iron deficiency anemia of chronic illness Patient noted to have a drop in hemoglobin with no signs of overt bleeding felt likely dilutional.  Status post 2 units  packed red blood cells hemoglobin currently stable at 9.6.  Patient noted to be having brown stool however Hemoccult was positive.  If patient continues to have further drop in hemoglobin will need to consult with GI for further evaluation.  Status post IV Feraheme.   Continue oral iron supplementation.  Outpatient follow-up. Follow H&H.  5.  Acute kidney injury Resolved with hydration.  6.  Proteus mirabilis UTI Patient on presentation had complaints of dysuria.  Urine cultures with > 100,000 colonies of Proteus mirabilis.  Clinical improvement.  Afebrile.  Status post 2 days IV cefepime and 1 dose of IV Rocephin.  No further antibiotics needed.  Discussed with ID.  7.  Hypertension Resume home regimen Norvasc 5 mg daily.  8.  Stage II pressure ulcer, chronic, POA Patient has been assessed by wound care.  Continue current recommendations per wound care.  Pressure Injury 05/22/19 Coccyx Bilateral Stage 4 - Full thickness tissue loss with exposed bone, tendon or muscle. Per wound nurse, healing stage IV from prior admission (Active)  05/22/19 2104  Location: Coccyx  Location Orientation: Bilateral  Staging: Stage 4 - Full thickness tissue loss with exposed bone, tendon or muscle.  Wound Description (Comments): Per wound nurse, healing stage IV from prior admission  Present on Admission: Yes     Pressure Injury 05/22/19 Knee Right;Distal Unstageable - Full thickness tissue loss in which the base of the injury is covered by slough (yellow, tan, gray, green or brown) and/or eschar (tan, brown or black) in the wound bed. Stump (Active)  05/22/19 2106  Location: Knee  Location Orientation: Right;Distal  Staging: Unstageable - Full thickness tissue loss in which the base of the injury is covered by slough (yellow, tan, gray, green or brown) and/or eschar (tan, brown or black) in the wound bed.  Wound Description (Comments): Stump  Present on Admission: Yes     Pressure Injury 05/22/19 Knee Left;Distal Unstageable - Full thickness tissue loss in which the base of the injury is covered by slough (yellow, tan, gray, green or brown) and/or eschar (tan, brown or black) in the wound bed. Stump (Active)  05/22/19 2108  Location: Knee  Location Orientation:  Left;Distal  Staging: Unstageable - Full thickness tissue loss in which the base of the injury is covered by slough (yellow, tan, gray, green or brown) and/or eschar (tan, brown or black) in the wound bed.  Wound Description (Comments): Stump  Present on Admission: Yes     Pressure Injury 05/22/19 Thigh Left;Mid Stage 2 -  Partial thickness loss of dermis presenting as a shallow open injury with a red, pink wound bed without slough. (Active)  05/22/19 2109  Location: Thigh  Location Orientation: Left;Mid  Staging: Stage 2 -  Partial thickness loss of dermis presenting as a shallow open injury with a red, pink wound bed without slough.  Wound Description (Comments):   Present on Admission: Yes     Pressure Injury 07/22/19 Sacrum Mid Stage 3 -  Full thickness tissue loss. Subcutaneous fat may be visible but bone, tendon or muscle are NOT exposed. (Active)  07/22/19   Location: Sacrum  Location Orientation: Mid  Staging: Stage 3 -  Full thickness tissue loss. Subcutaneous fat may be visible but bone, tendon or muscle are NOT exposed.  Wound Description (Comments):   Present on Admission: Yes     Pressure Injury 07/22/19 Leg Left Stage 3 -  Full thickness tissue loss. Subcutaneous fat may be visible but bone, tendon or muscle are  NOT exposed. (Active)  07/22/19   Location: Leg  Location Orientation: Left  Staging: Stage 3 -  Full thickness tissue loss. Subcutaneous fat may be visible but bone, tendon or muscle are NOT exposed.  Wound Description (Comments):   Present on Admission: Yes     Pressure Injury 07/22/19 Leg Right Stage 3 -  Full thickness tissue loss. Subcutaneous fat may be visible but bone, tendon or muscle are NOT exposed. (Active)  07/22/19   Location: Leg  Location Orientation: Right  Staging: Stage 3 -  Full thickness tissue loss. Subcutaneous fat may be visible but bone, tendon or muscle are NOT exposed.  Wound Description (Comments):   Present on Admission: Yes         DVT prophylaxis: Lovenox on hold due to anemia and positive Hemoccult. Code Status: DNR Family Communication: Updated patient.  Updated daughter via speaker phone as daughter was on telephone during visit. Disposition:   Status is: Inpatient    Dispo: The patient is from: Home              Anticipated d/c is to: Home with home health.              Anticipated d/c date is: Hopefully 1 to 2 days.              Patient currently on IV Eraxis, Candida bacteremia, PICC line has been removed for line holiday and likely PICC line to be placed in today.  Will need to be started back on TPN.        Consultants:   Ophthalmology: Dr. Ellie Lunch 08/19/2019  ID: Dr. Megan Salon 08/17/2019  Procedures:   CT abdomen and pelvis 08/18/2019  Chest x-ray 08/17/2019  2D echo 08/18/2019  PICC line removal  2 units packed red blood cell transfusion 08/18/2019  Antimicrobials:  IV Eraxis 08/18/2019>>>> 08/31/2019  IV cefepime 08/17/2019>>>> 08/18/2019  IV Rocephin 6/9/2021x1 dose   Subjective: Patient sitting up in bed on the telephone with her daughter.  Patient denies any chest pain.  No shortness of breath.  No abdominal pain.  No visual difficulties.  Poor oral intake but tolerating current diet.    Objective: Vitals:   08/20/19 2000 08/21/19 0000 08/21/19 0400 08/21/19 0748  BP: (!) 152/80 (!) 145/68 (!) 145/72 (!) 153/76  Pulse: 72 66 67 69  Resp: 13 11 13 15   Temp:  97.7 F (36.5 C) 98.5 F (36.9 C) 97.6 F (36.4 C)  TempSrc:  Oral Oral Oral  SpO2: 99% 96% 97% 99%  Weight:      Height:        Intake/Output Summary (Last 24 hours) at 08/21/2019 0939 Last data filed at 08/21/2019 0438 Gross per 24 hour  Intake 107.83 ml  Output 500 ml  Net -392.17 ml   Filed Weights   08/17/19 0900  Weight: 49.9 kg    Examination:  General exam: NAD Respiratory system: Lungs clear to auscultation bilaterally.  No wheezes, no crackles, no rhonchi.  Normal respiratory effort.  Speaking in  full sentences.  Cardiovascular system: Regular rate and rhythm no murmurs rubs or gallops.  No JVD. Gastrointestinal system: Abdomen is soft, nontender, nondistended, positive bowel sounds.  Ostomy with loose stool noted.  Central nervous system: Alert and oriented. No focal neurological deficits. Extremities: Status post bilateral BKA.  Skin: No rashes, lesions or ulcers Psychiatry: Judgement and insight appear normal. Mood & affect appropriate.     Data Reviewed: I have personally reviewed following labs and  imaging studies  CBC: Recent Labs  Lab 08/17/19 0950 08/17/19 1331 08/18/19 0443 08/18/19 2109 08/19/19 0619 08/20/19 0235 08/21/19 0308  WBC 4.8  --  5.0  --  6.5 6.2 8.4  NEUTROABS 4.1  --  3.8  --  4.5 3.8 5.2  HGB 7.5*   < > 5.6* 10.2* 9.9* 9.8* 9.6*  HCT 24.4*   < > 17.6* 30.5* 30.4* 29.8* 29.7*  MCV 92.8  --  90.3  --  88.6 87.6 87.6  PLT 195  --  141*  --  127* 145* 163   < > = values in this interval not displayed.    Basic Metabolic Panel: Recent Labs  Lab 08/17/19 2051 08/18/19 0443 08/19/19 0619 08/20/19 0235 08/21/19 0308  NA 131* 132* 133* 136 134*  K 5.3* 4.0 3.9 3.5 4.1  CL 101 105 106 110 110  CO2 22 22 18* 19* 19*  GLUCOSE 177* 215* 94 104* 73  BUN 68* 63* 43* 36* 29*  CREATININE 1.34* 1.22* 0.93 0.94 1.08*  CALCIUM 8.2* 8.0* 8.9 8.9 8.8*  MG  --  1.8 1.7 1.8 2.6*  PHOS  --  3.4 3.3 3.3  --     GFR: Estimated Creatinine Clearance: 44.7 mL/min (A) (by C-G formula based on SCr of 1.08 mg/dL (H)).  Liver Function Tests: Recent Labs  Lab 08/17/19 0950 08/18/19 0443 08/19/19 0619 08/20/19 0235  AST 20 39 30 22  ALT 20 30 24 21   ALKPHOS 72 51 58 75  BILITOT 0.6 0.6 0.7 0.7  PROT 7.8 5.6* 6.2* 6.3*  ALBUMIN 1.9* 1.5* 2.0* 1.8*    CBG: Recent Labs  Lab 08/20/19 0801 08/20/19 1108 08/20/19 1714 08/20/19 2130 08/21/19 0623  GLUCAP 117* 190* 104* 102* 73     Recent Results (from the past 240 hour(s))  SARS Coronavirus 2 by  RT PCR (hospital order, performed in [facility] hospital lab) Nasopharyngeal Nasopharyngeal Swab     Status: None   Collection Time: 08/17/19  8:55 AM   Specimen: Nasopharyngeal Swab  Result Value Ref Range Status   SARS Coronavirus 2 NEGATIVE NEGATIVE Final    Comment: (NOTE) SARS-CoV-2 target nucleic acids are NOT DETECTED. The SARS-CoV-2 RNA is generally detectable in upper and lower respiratory specimens during the acute phase of infection. The lowest concentration of SARS-CoV-2 viral copies this assay can detect is 250 copies / mL. A negative result does not preclude SARS-CoV-2 infection and should not be used as the sole basis for treatment or other patient management decisions.  A negative result may occur with improper specimen collection / handling, submission of specimen other than nasopharyngeal swab, presence of viral mutation(s) within the areas targeted by this assay, and inadequate number of viral copies (<250 copies / mL). A negative result must be combined with clinical observations, patient history, and epidemiological information. Fact Sheet for Patients:   StrictlyIdeas.no Fact Sheet for Healthcare Providers: BankingDealers.co.za This test is not yet approved or cleared  by the [place] FDA and has been authorized for detection and/or diagnosis of SARS-CoV-2 by FDA under an Emergency Use Authorization (EUA).  This EUA will remain in effect (meaning this test can be used) for the duration of the COVID-19 declaration under Section 564(b)(1) of the Act, 21 U.S.C. section 360bbb-3(b)(1), unless the authorization is terminated or revoked sooner. Performed at [facility], [place] [address], [place], [place] [address]   Urine culture     Status: Abnormal   Collection Time: 08/17/19  9:05 AM   Specimen: In/Out Cath Urine  Result Value Ref Range Status   Specimen Description IN/OUT CATH URINE  Final   Special  Requests   Final    NONE Performed at [facility], [address], [place], [place] [address]    Culture >=100,000 COLONIES/mL PROTEUS MIRABILIS (A)  Final   Report Status 08/20/2019 FINAL  Final   Organism ID, Bacteria PROTEUS MIRABILIS (A)  Final      Susceptibility   Proteus mirabilis - MIC*    AMPICILLIN <=2 SENSITIVE Sensitive     CEFAZOLIN <=4 SENSITIVE Sensitive     CEFTRIAXONE <=1 SENSITIVE Sensitive     CIPROFLOXACIN >=4 RESISTANT Resistant     GENTAMICIN <=1 SENSITIVE Sensitive     IMIPENEM 2 SENSITIVE Sensitive     NITROFURANTOIN 128 RESISTANT Resistant     TRIMETH/SULFA >=320 RESISTANT Resistant     AMPICILLIN/SULBACTAM <=2 SENSITIVE Sensitive     PIP/TAZO <=4 SENSITIVE Sensitive     * >=100,000 COLONIES/mL PROTEUS MIRABILIS  Blood Culture (routine x 2)     Status: Abnormal (Preliminary result)   Collection Time: 08/17/19  9:50 AM   Specimen: BLOOD LEFT ARM  Result Value Ref Range Status   Specimen Description BLOOD LEFT ARM  Final   Special Requests   Final    BOTTLES DRAWN AEROBIC AND ANAEROBIC Blood Culture adequate volume   Culture  Setup Time   Final    BUDDING YEAST SEEN IN BOTH AEROBIC AND ANAEROBIC BOTTLES CRITICAL RESULT CALLED TO, READ BACK BY AND VERIFIED WITH: Park Breed [id number] FCP Performed at [facility], [address], [place], [place] [address]    Culture CANDIDA GLABRATA (A)  Final   Report Status PENDING  Incomplete  Blood Culture ID Panel (Reflexed)     Status: Abnormal   Collection Time: 08/17/19  9:50 AM  Result Value Ref Range Status   Enterococcus species NOT DETECTED NOT DETECTED Final   Listeria monocytogenes NOT DETECTED NOT DETECTED Final   Staphylococcus species NOT DETECTED NOT DETECTED Final   Staphylococcus aureus (BCID) NOT DETECTED NOT DETECTED Final   Streptococcus species NOT DETECTED NOT DETECTED Final   Streptococcus agalactiae NOT DETECTED NOT DETECTED Final   Streptococcus pneumoniae NOT DETECTED  NOT DETECTED Final   Streptococcus pyogenes NOT DETECTED NOT DETECTED Final   Acinetobacter baumannii NOT DETECTED NOT DETECTED Final   Enterobacteriaceae species NOT DETECTED NOT DETECTED Final   Enterobacter cloacae complex NOT DETECTED NOT DETECTED Final   Escherichia coli NOT DETECTED NOT DETECTED Final   Klebsiella oxytoca NOT DETECTED NOT DETECTED Final   Klebsiella pneumoniae NOT DETECTED NOT DETECTED Final   Proteus species NOT DETECTED NOT DETECTED Final   Serratia marcescens NOT DETECTED NOT DETECTED Final   Haemophilus influenzae NOT DETECTED NOT DETECTED Final   Neisseria meningitidis NOT DETECTED NOT DETECTED Final   Pseudomonas aeruginosa NOT DETECTED NOT DETECTED Final   Candida albicans NOT DETECTED NOT DETECTED Final   Candida glabrata DETECTED (A) NOT DETECTED Final    Comment: CRITICAL RESULT CALLED TO, READ BACK BY AND VERIFIED WITH: PHARMD JEREMY F. [id number] FCP    Candida krusei NOT DETECTED NOT DETECTED Final   Candida parapsilosis NOT DETECTED NOT DETECTED Final   Candida tropicalis NOT DETECTED NOT DETECTED Final    Comment: Performed at [facility], [address] [place], [place] [address]  Blood Culture (routine x 2)     Status: Abnormal (  Preliminary result)   Collection Time: 08/17/19  1:12 PM   Specimen: BLOOD LEFT ARM  Result Value Ref Range Status   Specimen Description BLOOD LEFT ARM  Final   Special Requests   Final    BOTTLES DRAWN AEROBIC ONLY Blood Culture adequate volume   Culture  Setup Time   Final    AEROBIC BOTTLE ONLY YEAST CRITICAL VALUE NOTED.  VALUE IS CONSISTENT WITH PREVIOUSLY REPORTED AND CALLED VALUE.    Culture (A)  Final    YEAST IDENTIFICATION TO FOLLOW Performed at [facility], [place] [address], [place], [place] [address]    Report Status PENDING  Incomplete  MRSA PCR Screening     Status: Abnormal   Collection Time: 08/17/19  8:23 PM   Specimen: Nasal Mucosa; Nasopharyngeal  Result Value Ref Range Status     MRSA by PCR POSITIVE (A) NEGATIVE Final    Comment:        The GeneXpert MRSA Assay (FDA approved for NASAL specimens only), is one component of a comprehensive MRSA colonization surveillance program. It is not intended to diagnose MRSA infection nor to guide or monitor treatment for MRSA infections. RESULT CALLED TO, READ BACK BY AND VERIFIED WITH: J. MILLER,CHARGE RN [id number] 08/17/2019 Mena Goes Performed at [facility], [place] [address] [place], [place] [address]   Culture, blood (routine x 2)     Status: None (Preliminary result)   Collection Time: 08/19/19  5:56 AM   Specimen: BLOOD LEFT HAND  Result Value Ref Range Status   Specimen Description BLOOD LEFT HAND  Final   Special Requests   Final    BOTTLES DRAWN AEROBIC AND ANAEROBIC Blood Culture adequate volume   Culture   Final    NO GROWTH 2 DAYS Performed at [facility], [place] [address] [place], [place] [address]    Report Status PENDING  Incomplete  Culture, blood (routine x 2)     Status: None (Preliminary result)   Collection Time: 08/19/19  5:56 AM   Specimen: BLOOD LEFT HAND  Result Value Ref Range Status   Specimen Description BLOOD LEFT HAND  Final   Special Requests   Final    BOTTLES DRAWN AEROBIC AND ANAEROBIC Blood Culture adequate volume   Culture   Final    NO GROWTH 2 DAYS Performed at [facility], [place] [address], [place],  [address]    Report Status PENDING  Incomplete         Radiology Studies: [place] EKG SITE RITE  Result Date: 08/21/2019 If Site Rite image not attached, placement could not be confirmed due to current cardiac rhythm.       Scheduled Meds: . ascorbic acid  500 mg Oral BID  . Chlorhexidine Gluconate Cloth  6 each Topical Daily  . escitalopram  20 mg Oral Daily  . feeding supplement (ENSURE ENLIVE)  237 mL Oral TID BM  . ferrous sulfate  300 mg Oral Q breakfast  . gabapentin  300 mg Oral BID  . insulin aspart  0-5 Units Subcutaneous QHS  .  insulin aspart  0-9 Units Subcutaneous TID WC  . melatonin  3 mg Oral QHS  . mupirocin ointment  1 application Nasal BID  . pantoprazole  40 mg Oral BID   Continuous Infusions: . anidulafungin 100 mg (08/20/19 1400)     LOS: 4 days    Time spent: 40 minutes    Irine Seal, MD [facility]  To contact the attending provider between 7A-7P or the covering provider during after hours 7P-7A, please log into the web site [email or web address] and access using universal [place] password for that web site. If you do not have the password, please call the hospital operator.  08/21/2019, 9:39 AM

## 2019-08-21 NOTE — Progress Notes (Signed)
Peripherally Inserted Central Catheter Placement  The IV Nurse has discussed with the patient and/or persons authorized to consent for the patient, the purpose of this procedure and the potential benefits and risks involved with this procedure.  The benefits include less needle sticks, lab draws from the catheter, and the patient may be discharged home with the catheter. Risks include, but not limited to, infection, bleeding, blood clot (thrombus formation), and puncture of an artery; nerve damage and irregular heartbeat and possibility to perform a PICC exchange if needed/ordered by physician.  Alternatives to this procedure were also discussed.  Bard Power PICC patient education guide, fact sheet on infection prevention and patient information card has been provided to patient /or left at bedside.    PICC Placement Documentation  PICC Double Lumen 08/21/19 PICC Right Brachial 35 cm 0 cm (Active)  Indication for Insertion or Continuance of Line Administration of hyperosmolar/irritating solutions (i.e. TPN, Vancomycin, etc.) 08/21/19 1651  Exposed Catheter (cm) 0 cm 08/21/19 1651  Site Assessment Clean;Dry;Intact 08/21/19 1651  Lumen #1 Status Flushed;Saline locked;Blood return noted 08/21/19 1651  Lumen #2 Status Flushed;Saline locked;Blood return noted 08/21/19 1651  Dressing Type Transparent 08/21/19 1651  Dressing Status Clean;Dry;Intact;Antimicrobial disc in place 08/21/19 1651  Dressing Intervention New dressing 08/21/19 1651  Dressing Change Due 08/28/19 08/21/19 1651       Rakesh Dutko, Nicolette Bang 08/21/2019, 4:53 PM

## 2019-08-21 NOTE — Progress Notes (Signed)
PHARMACY CONSULT NOTE FOR:  OUTPATIENT  PARENTERAL ANTIBIOTIC THERAPY (OPAT)  Indication: Candida glabrata bacteremia  Regimen: Caspofungin 70 mg x 1 then 50 mg daily  End date: 08/31/2019  IV antibiotic discharge orders are pended. To discharging provider:  please sign these orders via discharge navigator,  Select New Orders & click on the button choice - Manage This Unsigned Work.     Changing outpatient echinocandin to match [employer] formulary.   Thank you for allowing pharmacy to be a part of this patient's care.  Jimmy Footman, PharmD, BCPS, BCIDP Infectious Diseases Clinical Pharmacist Phone: [phone] 08/21/2019, 11:00 AM

## 2019-08-21 NOTE — Progress Notes (Signed)
[facility]   Reason for visit: Follow up on Candidemia  Interval History: feels better overall.  No concerns.  No fever, no chills.  Eraxis day 4  Physical Exam: Constitutional:  Vitals:   08/21/19 0400 08/21/19 0748  BP: (!) 145/72 (!) 153/76  Pulse: 67 69  Resp: 13 15  Temp: 98.5 F (36.9 C) 97.6 F (36.4 C)  SpO2: 97% 99%   patient appears in NAD Respiratory: Normal respiratory effort; CTA B Cardiovascular: RRR GI: soft, nt, nd  Review of Systems: Constitutional: negative for fevers and chills Gastrointestinal: negative for diarrhea  Lab Results  Component Value Date   WBC 8.4 08/21/2019   HGB 9.6 (L) 08/21/2019   HCT 29.7 (L) 08/21/2019   MCV 87.6 08/21/2019   PLT 163 08/21/2019    Lab Results  Component Value Date   CREATININE 1.08 (H) 08/21/2019   BUN 29 (H) 08/21/2019   NA 134 (L) 08/21/2019   K 4.1 08/21/2019   CL 110 08/21/2019   CO2 19 (L) 08/21/2019    Lab Results  Component Value Date   ALT 21 08/20/2019   AST 22 08/20/2019   ALKPHOS 75 08/20/2019     Microbiology: Recent Results (from the past 240 hour(s))  SARS Coronavirus 2 by RT PCR (hospital order, performed in [facility] hospital lab) Nasopharyngeal Nasopharyngeal Swab     Status: None   Collection Time: 08/17/19  8:55 AM   Specimen: Nasopharyngeal Swab  Result Value Ref Range Status   SARS Coronavirus 2 NEGATIVE NEGATIVE Final    Comment: (NOTE) SARS-CoV-2 target nucleic acids are NOT DETECTED. The SARS-CoV-2 RNA is generally detectable in upper and lower respiratory specimens during the acute phase of infection. The lowest concentration of SARS-CoV-2 viral copies this assay can detect is 250 copies / mL. A negative result does not preclude SARS-CoV-2 infection and should not be used as the sole basis for treatment or other patient management decisions.  A negative result may occur with improper specimen collection / handling, submission of  specimen other than nasopharyngeal swab, presence of viral mutation(s) within the areas targeted by this assay, and inadequate number of viral copies (<250 copies / mL). A negative result must be combined with clinical observations, patient history, and epidemiological information. Fact Sheet for Patients:   StrictlyIdeas.no Fact Sheet for Healthcare Providers: BankingDealers.co.za This test is not yet approved or cleared  by the [place] FDA and has been authorized for detection and/or diagnosis of SARS-CoV-2 by FDA under an Emergency Use Authorization (EUA).  This EUA will remain in effect (meaning this test can be used) for the duration of the COVID-19 declaration under Section 564(b)(1) of the Act, 21 U.S.C. section 360bbb-3(b)(1), unless the authorization is terminated or revoked sooner. Performed at [facility], [place] [address] [place], [place] [address]   Urine culture     Status: Abnormal   Collection Time: 08/17/19  9:05 AM   Specimen: In/Out Cath Urine  Result Value Ref Range Status   Specimen Description IN/OUT CATH URINE  Final   Special Requests   Final    NONE Performed at [facility], [place] [address], [place], [place] [address]    Culture >=100,000 COLONIES/mL PROTEUS MIRABILIS (A)  Final   Report Status 08/20/2019 FINAL  Final   Organism ID, Bacteria PROTEUS MIRABILIS (A)  Final      Susceptibility   Proteus mirabilis - MIC*    AMPICILLIN <=  2 SENSITIVE Sensitive     CEFAZOLIN <=4 SENSITIVE Sensitive     CEFTRIAXONE <=1 SENSITIVE Sensitive     CIPROFLOXACIN >=4 RESISTANT Resistant     GENTAMICIN <=1 SENSITIVE Sensitive     IMIPENEM 2 SENSITIVE Sensitive     NITROFURANTOIN 128 RESISTANT Resistant     TRIMETH/SULFA >=320 RESISTANT Resistant     AMPICILLIN/SULBACTAM <=2 SENSITIVE Sensitive     PIP/TAZO <=4 SENSITIVE Sensitive     * >=100,000 COLONIES/mL PROTEUS MIRABILIS  Blood Culture (routine x  2)     Status: Abnormal (Preliminary result)   Collection Time: 08/17/19  9:50 AM   Specimen: BLOOD LEFT ARM  Result Value Ref Range Status   Specimen Description BLOOD LEFT ARM  Final   Special Requests   Final    BOTTLES DRAWN AEROBIC AND ANAEROBIC Blood Culture adequate volume   Culture  Setup Time   Final    BUDDING YEAST SEEN IN BOTH AEROBIC AND ANAEROBIC BOTTLES CRITICAL RESULT CALLED TO, READ BACK BY AND VERIFIED WITH: Park Breed [phone] FCP Performed at [facility], [place] [address], [place], [place] [address]    Culture CANDIDA GLABRATA (A)  Final   Report Status PENDING  Incomplete  Blood Culture ID Panel (Reflexed)     Status: Abnormal   Collection Time: 08/17/19  9:50 AM  Result Value Ref Range Status   Enterococcus species NOT DETECTED NOT DETECTED Final   Listeria monocytogenes NOT DETECTED NOT DETECTED Final   Staphylococcus species NOT DETECTED NOT DETECTED Final   Staphylococcus aureus (BCID) NOT DETECTED NOT DETECTED Final   Streptococcus species NOT DETECTED NOT DETECTED Final   Streptococcus agalactiae NOT DETECTED NOT DETECTED Final   Streptococcus pneumoniae NOT DETECTED NOT DETECTED Final   Streptococcus pyogenes NOT DETECTED NOT DETECTED Final   Acinetobacter baumannii NOT DETECTED NOT DETECTED Final   Enterobacteriaceae species NOT DETECTED NOT DETECTED Final   Enterobacter cloacae complex NOT DETECTED NOT DETECTED Final   Escherichia coli NOT DETECTED NOT DETECTED Final   Klebsiella oxytoca NOT DETECTED NOT DETECTED Final   Klebsiella pneumoniae NOT DETECTED NOT DETECTED Final   Proteus species NOT DETECTED NOT DETECTED Final   Serratia marcescens NOT DETECTED NOT DETECTED Final   Haemophilus influenzae NOT DETECTED NOT DETECTED Final   Neisseria meningitidis NOT DETECTED NOT DETECTED Final   Pseudomonas aeruginosa NOT DETECTED NOT DETECTED Final   Candida albicans NOT DETECTED NOT DETECTED Final   Candida glabrata DETECTED (A) NOT  DETECTED Final    Comment: CRITICAL RESULT CALLED TO, READ BACK BY AND VERIFIED WITH: PHARMD JEREMY F. [phone] FCP    Candida krusei NOT DETECTED NOT DETECTED Final   Candida parapsilosis NOT DETECTED NOT DETECTED Final   Candida tropicalis NOT DETECTED NOT DETECTED Final    Comment: Performed at [facility], [address], [place], [place] [address]  Blood Culture (routine x 2)     Status: Abnormal (Preliminary result)   Collection Time: 08/17/19  1:12 PM   Specimen: BLOOD LEFT ARM  Result Value Ref Range Status   Specimen Description BLOOD LEFT ARM  Final   Special Requests   Final    BOTTLES DRAWN AEROBIC ONLY Blood Culture adequate volume   Culture  Setup Time   Final    AEROBIC BOTTLE ONLY YEAST CRITICAL VALUE NOTED.  VALUE IS CONSISTENT WITH PREVIOUSLY REPORTED AND CALLED VALUE.    Culture (A)  Final    YEAST IDENTIFICATION TO FOLLOW  Performed at [facility], [place] [address] [place], [place] [address]    Report Status PENDING  Incomplete  MRSA PCR Screening     Status: Abnormal   Collection Time: 08/17/19  8:23 PM   Specimen: Nasal Mucosa; Nasopharyngeal  Result Value Ref Range Status   MRSA by PCR POSITIVE (A) NEGATIVE Final    Comment:        The GeneXpert MRSA Assay (FDA approved for NASAL specimens only), is one component of a comprehensive MRSA colonization surveillance program. It is not intended to diagnose MRSA infection nor to guide or monitor treatment for MRSA infections. RESULT CALLED TO, READ BACK BY AND VERIFIED WITH: J. MILLER,CHARGE RN [id number] 08/17/2019 Mena Goes Performed at [facility], [place] [address], [place], [place] [address]   Culture, blood (routine x 2)     Status: None (Preliminary result)   Collection Time: 08/19/19  5:56 AM   Specimen: BLOOD LEFT HAND  Result Value Ref Range Status   Specimen Description BLOOD LEFT HAND  Final   Special Requests   Final    BOTTLES DRAWN AEROBIC AND ANAEROBIC Blood Culture  adequate volume   Culture   Final    NO GROWTH 2 DAYS Performed at [facility], [place] [address] [place], [place] [address]    Report Status PENDING  Incomplete  Culture, blood (routine x 2)     Status: None (Preliminary result)   Collection Time: 08/19/19  5:56 AM   Specimen: BLOOD LEFT HAND  Result Value Ref Range Status   Specimen Description BLOOD LEFT HAND  Final   Special Requests   Final    BOTTLES DRAWN AEROBIC AND ANAEROBIC Blood Culture adequate volume   Culture   Final    NO GROWTH 2 DAYS Performed at [facility], [place] [address], [place], [place] [address]    Report Status PENDING  Incomplete    Impression/Plan:  1. 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   Eye exam ok, TTE ok. Will need 14 days of treatment.   2.  Access - blood cultures remain negative.  Will place order for picc line.  Diagnosis: Line infection with Candida parapsilosis  Culture Result: C parapsilosis  Allergies  Allergen Reactions   Bee Venom Swelling    Swells up with bee stings    Metformin And Related Other (See Comments)    Upset stomach    OPAT Orders Discharge antibiotics to be given via PICC line Discharge antibiotics: anidulafungin Per pharmacy protocol yes Duration: 14 days End Date: 08/31/19  [facility] Care Per Protocol: yes   Home health RN for IV administration and teaching; PICC line care and labs.    Labs weekly while on IV antibiotics: _x_ CBC with differential _x_ BMP __ CMP __ CRP __ ESR __ Vancomycin trough __ CK  __ Please pull PIC at completion of IV antibiotics _x_ Please leave PIC in place - she is on chronic TPN and needs the line long term  Fax weekly labs to [phone]  Clinic Follow Up Appt: Not indicated unless clinical changes

## 2019-08-22 DIAGNOSIS — E1165 Type 2 diabetes mellitus with hyperglycemia: Secondary | ICD-10-CM

## 2019-08-22 DIAGNOSIS — Z794 Long term (current) use of insulin: Secondary | ICD-10-CM

## 2019-08-22 LAB — COMPREHENSIVE METABOLIC PANEL
ALT: 41 U/L (ref 0–44)
AST: 55 U/L — ABNORMAL HIGH (ref 15–41)
Albumin: 1.8 g/dL — ABNORMAL LOW (ref 3.5–5.0)
Alkaline Phosphatase: 266 U/L — ABNORMAL HIGH (ref 38–126)
Anion gap: 7 (ref 5–15)
BUN: 40 mg/dL — ABNORMAL HIGH (ref 6–20)
CO2: 20 mmol/L — ABNORMAL LOW (ref 22–32)
Calcium: 8.4 mg/dL — ABNORMAL LOW (ref 8.9–10.3)
Chloride: 103 mmol/L (ref 98–111)
Creatinine, Ser: 0.94 mg/dL (ref 0.44–1.00)
GFR calc Af Amer: >60 mL/min (ref >60)
GFR calc non Af Amer: >60 mL/min (ref >60)
Glucose, Bld: 447 mg/dL — ABNORMAL HIGH (ref 70–99)
Potassium: 4.7 mmol/L (ref 3.5–5.1)
Sodium: 130 mmol/L — ABNORMAL LOW (ref 135–145)
Total Bilirubin: 1.7 mg/dL — ABNORMAL HIGH (ref 0.3–1.2)
Total Protein: 6.4 g/dL — ABNORMAL LOW (ref 6.5–8.1)

## 2019-08-22 LAB — CBC WITH DIFFERENTIAL/PLATELET
Abs Immature Granulocytes: 0.03 10*3/uL (ref 0.00–0.07)
Basophils Absolute: 0 10*3/uL (ref 0.0–0.1)
Basophils Relative: 0 %
Eosinophils Absolute: 0.1 10*3/uL (ref 0.0–0.5)
Eosinophils Relative: 1 %
HCT: 28.3 % — ABNORMAL LOW (ref 36.0–46.0)
Hemoglobin: 9.2 g/dL — ABNORMAL LOW (ref 12.0–15.0)
Immature Granulocytes: 0 %
Lymphocytes Relative: 17 %
Lymphs Abs: 1.4 10*3/uL (ref 0.7–4.0)
MCH: 29 pg (ref 26.0–34.0)
MCHC: 32.5 g/dL (ref 30.0–36.0)
MCV: 89.3 fL (ref 80.0–100.0)
Monocytes Absolute: 0.8 10*3/uL (ref 0.1–1.0)
Monocytes Relative: 10 %
Neutro Abs: 5.9 10*3/uL (ref 1.7–7.7)
Neutrophils Relative %: 72 %
Platelets: 201 10*3/uL (ref 150–400)
RBC: 3.17 MIL/uL — ABNORMAL LOW (ref 3.87–5.11)
RDW: 15.5 % (ref 11.5–15.5)
WBC: 8.2 10*3/uL (ref 4.0–10.5)
nRBC: 0 % (ref 0.0–0.2)

## 2019-08-22 LAB — GLUCOSE, CAPILLARY
Glucose-Capillary: 331 mg/dL — ABNORMAL HIGH (ref 70–99)
Glucose-Capillary: 439 mg/dL — ABNORMAL HIGH (ref 70–99)
Glucose-Capillary: 174 mg/dL — ABNORMAL HIGH (ref 70–99)
Glucose-Capillary: 128 mg/dL — ABNORMAL HIGH (ref 70–99)
Glucose-Capillary: 210 mg/dL — ABNORMAL HIGH (ref 70–99)

## 2019-08-22 LAB — TRIGLYCERIDES: Triglycerides: 103 mg/dL (ref <150)

## 2019-08-22 LAB — PREALBUMIN: Prealbumin: 8.9 mg/dL — ABNORMAL LOW (ref 18–38)

## 2019-08-22 LAB — MAGNESIUM: Magnesium: 2.1 mg/dL (ref 1.7–2.4)

## 2019-08-22 LAB — PHOSPHORUS: Phosphorus: 3.6 mg/dL (ref 2.5–4.6)

## 2019-08-22 MED ORDER — BOOST / RESOURCE BREEZE PO LIQD CUSTOM
1.0000 | Freq: Three times a day (TID) | ORAL | Status: DC
  Administered 2019-08-22 – 2019-08-29 (×20): 1 via ORAL

## 2019-08-22 MED ORDER — TRAVASOL 10 % IV SOLN: INTRAVENOUS | Status: DC

## 2019-08-22 MED ORDER — INSULIN ASPART 100 UNIT/ML ~~LOC~~ SOLN
0.0000 [IU] | SUBCUTANEOUS | Status: DC
  Administered 2019-08-22: 1 [IU] via SUBCUTANEOUS
  Administered 2019-08-22: 3 [IU] via SUBCUTANEOUS
  Administered 2019-08-22: 2 [IU] via SUBCUTANEOUS
  Administered 2019-08-23 (×3): 3 [IU] via SUBCUTANEOUS

## 2019-08-22 MED ORDER — TRAVASOL 10 % IV SOLN
INTRAVENOUS | Status: AC
  Filled 2019-08-22: qty 900

## 2019-08-22 MED ORDER — INSULIN GLARGINE 100 UNIT/ML ~~LOC~~ SOLN
5.0000 [IU] | SUBCUTANEOUS | Status: DC
  Administered 2019-08-22 – 2019-08-23 (×2): 5 [IU] via SUBCUTANEOUS
  Filled 2019-08-22 (×2): qty 0.05

## 2019-08-22 MED ORDER — PROCHLORPERAZINE EDISYLATE 10 MG/2ML IJ SOLN
10.0000 mg | Freq: Four times a day (QID) | INTRAMUSCULAR | Status: DC | PRN
  Filled 2019-08-22: qty 2

## 2019-08-22 MED ORDER — METOPROLOL TARTRATE 50 MG PO TABS
50.0000 mg | ORAL_TABLET | Freq: Two times a day (BID) | ORAL | Status: DC
  Administered 2019-08-22 – 2019-08-29 (×16): 50 mg via ORAL
  Filled 2019-08-22 (×16): qty 1

## 2019-08-22 NOTE — Progress Notes (Signed)
Pt's care started from 11 am, got report from April RN  Palma Holter, RN

## 2019-08-22 NOTE — Plan of Care (Signed)

## 2019-08-22 NOTE — Progress Notes (Signed)
.  PHARMACY - TOTAL PARENTERAL NUTRITION CONSULT NOTE   Indication: Fistula  Patient Measurements: Height: '5\' 2"'$  (157.5 cm) Weight: 49.9 kg (110 lb) IBW/kg (Calculated) : 50.1 TPN AdjBW (KG): 49.9 Body mass index is 20.12 kg/m. Usual Weight: 53-60 kg  Assessment: 59 yo female presents on 08/17/2019 with fever, dysuria, and decreased intake. On presentation, patient found to have blood glucose of > 600 and started on insulin gtt. Prior to admission patient is on chronic cyclic TPN via R arm PICC and soft diet. TPN was initiated in 02/2019 after perforated prepyloric ulcer which was repaired with a [name] patch and continued to leak. Patient currently has an Chief Strategy Officer in place from January 2021 due to [facility] fistula.   Per patient, decreased appetite and po intake for previous 3-4 days prior to presentation. She reports transitioning from eat a normal diet of foods such as grits, chicken, tuna, chicken noodle soup, chips, jello, and pudding to full liquid diet containing mainly soup. Per outpatient records, she is on a 14 hour TPN cycle.   TPN was held on admission until today due to candidemia which is being treated with anidulafungin. PICC line was pulled and is being reinserted today. Pharmacy consulted to resume TPN on 08/21/19.  Glucose / Insulin: Hx of DM (controlled by diet outpt). Hyperglycemia on admission started on insulin gtt then stable for days. CBGs uncontrolled during cyclic TPN ranging from 270-430.  Electrolytes: Na 130. Corrected Ca 10.1. CO2 20. Other electrolytes wnl.  Renal: Scr 0.94, baseline Scr 0.6-0.8. CO2 19. No UOP documented, 2 unmeasured.  LFTs / TGs: Alk phos 266 - increase. AST 55 - increase. ALT wnl. Tbili 1.7 - increase. TG 103. Prealbumin / albumin: prealbumin 8.9, albumin 1.8. Intake / Output; MIVF: No MIVF GI Imaging:  Surgeries / Procedures:   Central access: PICC 6/10 TPN start date: Chronic TPN prior to admission since 02/2019; TPN resumed on  6/10  Nutritional Goals (per outpatient records): kCal: 1471 kcal, Protein: 93 g, Fluid: 1800 Goal TPN rate is 75 mL/hr (provides 90g of protein and 1476 kcals per day)  Current Nutrition:  NPO  Resumed home 14 hr cyclic TPN on 0/30   Plan:  Adjust cyclic TPN to continuous TPN infusion at 75 mL/hr at 1800 today due to hyperglycemia. If tolerates will attempt to recycle starting at potentially with 16 hour cycle tomorrow  Adjusted electrolytes in TPN: increased Na to 150 mEq/L, decreased K to 20 mEq/L,  Continued electrolytes in TPN:  4.52mq/L of Ca, 554m/L of Mg, and 1065m/L of Phos. Cl:Ac ratio 1:2 Add standard MVI and trace elements to TPN Adjusted to sSSI q4h and added Lantus 5 units by Dr. [place] Silosd adjust as needed; follow up 1200 CBG to assess trend Monitor TPN labs on Mon/Thurs Monitor LFTs closely    GraCristela FeltharmD PGY1 Pharmacy Resident [employer]: [phone]

## 2019-08-22 NOTE — Progress Notes (Signed)
PROGRESS NOTE    Gina [name]  OMB:[id number] DOB: 21-Jun-1960 DOA: 08/17/2019 PCP: [name], MD   Chief Complaint  Patient presents with  . Fever    Brief Narrative:  Gina [name] a 59 y.o.femalewith medical history significant ofperforated prepyloric ulcerin 2020 s/pexlap with [name] patch, chronic TPN via right arm PICC, IDDM, bilateral lower quadrant drains along with enterocutaneous fistula with colostomy bag, Chronic sacral pressure ulcers stage II,HTN, schizophrenia, bilateral BKA, PVD s/p B/L BKA,sent from from home for evaluation of fever. She was admitted few times this year for sepsis, including MRSAbacteremia fungemia in [place] peritonitis in April andProteus UTIin May.Pt on TPN and she also take soft diet to improve her nutrition condition. Daughter noticed that pt has had decreased appetite for last 3-4 day and was warm to touch last night and complained about feeling of nausea but no vomiting. No abd pain. She also seemed to have burning sensation when urinated. Daughter reported that she does wound care everyday herself and did not see significant changes associated with the sacral wounds. Daughter also reported that pt Glucose has been fairly controlled at home seldom >200. ED Course:Cre1.4, CrCL 35 ml/min, Tmax 103.1, WBC WNL, LA 1.8. UA showed WBC 11-20/hpf. Xray no infiltrates. GLU >700    Assessment & Plan:   Principal Problem:   Fever Active Problems:   HTN (hypertension)   Diabetes (HCC)   Anemia of chronic disease   Sepsis ([place])   Schizophrenia ([place])   Acute kidney injury ([place])   Sacral decubitus ulcer   Normocytic anemia   S/P BKA (below knee amputation) bilateral (HCC)   Status post colostomy (HCC)   Enterocutaneous fistula   Urinary tract infection due to Proteus   Iron deficiency anemia  1 sepsis secondary to Candida glabrata fungemia, POA Patient presented with fevers, normal white count, tachycardic, normal  respiratory rate.  Blood cultures from 08/17/2019 with 1/2+ for Candida.  Repeat blood cultures from 08/19/2019 with no growth to date.  Patient noted to have been on TPN with a PICC line.  PICC line had been removed for line holiday pending repeat blood cultures.  CT abdomen and pelvis which was done with no significant change in size of fluid collection.  Patient seen by ophthalmology (08/19/2019) and endophthalmitis ruled out.  Continue IV Eraxis day #5/14.  2D echo which was done was negative for endocarditis.  Patient being seen in consultation by ID who are following and per ID as blood cultures are still negative PICC line ordered and was placed 08/21/2019. Patient will need a total of 14 days of treatment per ID recommendations.  ID following and I appreciate the input and recommendations.    2.  History of perforated gastric ulcer status post oversewing and Graham patch 01/2019/enterocutaneous fistula CT abdomen and pelvis with persistent fluid collection within ventral aspect of peritoneal cavity with continued fistula to skin surface at level of umbilicus, presumably ongoing enterocutaneous fistula, no significant change in size.  Per general surgical notes from 06/2019, appears developed enterocutaneous fistula postop from perforated ulcer treated with oversewing and [name] patch.  Wound care following.  PPI.  3.  Diabetes mellitus type 2 Hemoglobin A1c 7.0.  CBG of 439 this morning.  Elevated blood glucose levels likely secondary to TPN.  Start Lantus 5 units daily and uptitrate as needed for better blood glucose control.  Check CBGs every 4 hours.  Sliding scale insulin.  Outpatient follow-up with PCP.  4.  Anemia/iron deficiency anemia of chronic  illness Patient noted to have a drop in hemoglobin with no signs of overt bleeding felt likely dilutional.  Status post 2 units packed red blood cells hemoglobin currently stable at 9.2.  Patient noted to be having brown stool however Hemoccult was positive.   If patient continues to have further drop in hemoglobin will need to consult with GI for further evaluation.  Status post IV Feraheme.  Continue oral iron supplementation.  Outpatient follow-up. Follow H&H.  5.  Acute kidney injury Resolved with hydration.  6.  Proteus mirabilis UTI Patient on presentation had complaints of dysuria.  Urine cultures with > 100,000 colonies of Proteus mirabilis.  Clinical improvement.  Afebrile.  Status post 2 days IV cefepime and 1 dose of IV Rocephin.  No further antibiotics needed.  Discussed with ID.  7.  Hypertension Continue home regimen Norvasc.  Resume home regimen Lopressor.    8.  Stage II pressure ulcer, chronic, POA Patient has been assessed by wound care.  Continue current recommendations per wound care.  Pressure Injury 05/22/19 Coccyx Bilateral Stage 4 - Full thickness tissue loss with exposed bone, tendon or muscle. Per wound nurse, healing stage IV from prior admission (Active)  05/22/19 2104  Location: Coccyx  Location Orientation: Bilateral  Staging: Stage 4 - Full thickness tissue loss with exposed bone, tendon or muscle.  Wound Description (Comments): Per wound nurse, healing stage IV from prior admission  Present on Admission: Yes     Pressure Injury 05/22/19 Knee Right;Distal Unstageable - Full thickness tissue loss in which the base of the injury is covered by slough (yellow, tan, gray, green or brown) and/or eschar (tan, brown or black) in the wound bed. Stump (Active)  05/22/19 2106  Location: Knee  Location Orientation: Right;Distal  Staging: Unstageable - Full thickness tissue loss in which the base of the injury is covered by slough (yellow, tan, gray, green or brown) and/or eschar (tan, brown or black) in the wound bed.  Wound Description (Comments): Stump  Present on Admission: Yes     Pressure Injury 05/22/19 Knee Left;Distal Unstageable - Full thickness tissue loss in which the base of the injury is covered by slough  (yellow, tan, gray, green or brown) and/or eschar (tan, brown or black) in the wound bed. Stump (Active)  05/22/19 2108  Location: Knee  Location Orientation: Left;Distal  Staging: Unstageable - Full thickness tissue loss in which the base of the injury is covered by slough (yellow, tan, gray, green or brown) and/or eschar (tan, brown or black) in the wound bed.  Wound Description (Comments): Stump  Present on Admission: Yes     Pressure Injury 05/22/19 Thigh Left;Mid Stage 2 -  Partial thickness loss of dermis presenting as a shallow open injury with a red, pink wound bed without slough. (Active)  05/22/19 2109  Location: Thigh  Location Orientation: Left;Mid  Staging: Stage 2 -  Partial thickness loss of dermis presenting as a shallow open injury with a red, pink wound bed without slough.  Wound Description (Comments):   Present on Admission: Yes     Pressure Injury 07/22/19 Sacrum Mid Stage 3 -  Full thickness tissue loss. Subcutaneous fat may be visible but bone, tendon or muscle are NOT exposed. (Active)  07/22/19   Location: Sacrum  Location Orientation: Mid  Staging: Stage 3 -  Full thickness tissue loss. Subcutaneous fat may be visible but bone, tendon or muscle are NOT exposed.  Wound Description (Comments):   Present on Admission: Yes  Pressure Injury 07/22/19 Leg Left Stage 3 -  Full thickness tissue loss. Subcutaneous fat may be visible but bone, tendon or muscle are NOT exposed. (Active)  07/22/19   Location: Leg  Location Orientation: Left  Staging: Stage 3 -  Full thickness tissue loss. Subcutaneous fat may be visible but bone, tendon or muscle are NOT exposed.  Wound Description (Comments):   Present on Admission: Yes     Pressure Injury 07/22/19 Leg Right Stage 3 -  Full thickness tissue loss. Subcutaneous fat may be visible but bone, tendon or muscle are NOT exposed. (Active)  07/22/19   Location: Leg  Location Orientation: Right  Staging: Stage 3 -  Full  thickness tissue loss. Subcutaneous fat may be visible but bone, tendon or muscle are NOT exposed.  Wound Description (Comments):   Present on Admission: Yes        DVT prophylaxis: Lovenox on hold due to anemia and positive Hemoccult. Code Status: DNR Family Communication: Updated patient.  No family at bedside. Disposition:   Status is: Inpatient    Dispo: The patient is from: Home              Anticipated d/c is to: Home with home health.              Anticipated d/c date is: Hopefully 1 to 2 days once blood glucose levels have improved..              Patient currently on IV Eraxis, Candida bacteremia, PICC line placed back in 08/21/2019 and TPN started.  Patient now with elevated blood glucose levels in the 400s.  Blood glucose levels need to be better controlled prior to discharge.       Consultants:   Ophthalmology: Dr. Ellie Lunch 08/19/2019  ID: Dr. Megan Salon 08/17/2019  Procedures:   CT abdomen and pelvis 08/18/2019  Chest x-ray 08/17/2019  2D echo 08/18/2019  PICC line removal  2 units packed red blood cell transfusion 08/18/2019  PICC line 08/21/2019  Antimicrobials:  IV Eraxis 08/18/2019>>>> 08/31/2019  IV cefepime 08/17/2019>>>> 08/18/2019  IV Rocephin 6/9/2021x1 dose   Subjective: Patient sleeping but arousable.  Breakfast at bedside.  Patient denies any chest pain.  No shortness of breath.  No visual difficulties.  Tolerating current oral diet.  Patient eating about 40% of her meals.  TPN started yesterday.    Objective: Vitals:   08/21/19 1959 08/21/19 2313 08/22/19 0344 08/22/19 0740  BP: (!) 156/79 (!) 165/79 (!) 169/79   Pulse: 81 83 97   Resp: 12 18 20    Temp: 98.7 F (37.1 C) 98.5 F (36.9 C) 98.8 F (37.1 C) 98.4 F (36.9 C)  TempSrc: Oral Oral Oral Oral  SpO2: 98% 96% 98%   Weight:      Height:        Intake/Output Summary (Last 24 hours) at 08/22/2019 0931 Last data filed at 08/22/2019 0600 Gross per 24 hour  Intake 2060.12 ml  Output 600 ml    Net 1460.12 ml   Filed Weights   08/17/19 0900  Weight: 49.9 kg    Examination:  General exam: NAD Respiratory system: CTA B.  No wheezes, no crackles, no rhonchi.  Normal respiratory effort.  Speaking in full sentences.  Cardiovascular system: RRR no murmurs rubs or gallops.  No JVD.  Gastrointestinal system: Abdomen is nontender, nondistended, soft, positive bowel sounds.  Ostomy with loose stool noted.   Central nervous system: Alert and oriented. No focal neurological deficits. Extremities: Status post  bilateral BKA.  Skin: No rashes, lesions or ulcers Psychiatry: Judgement and insight appear normal. Mood & affect appropriate.     Data Reviewed: I have personally reviewed following labs and imaging studies  CBC: Recent Labs  Lab 08/18/19 0443 08/18/19 0443 08/18/19 2109 08/19/19 0619 08/20/19 0235 08/21/19 0308 08/22/19 0640  WBC 5.0  --   --  6.5 6.2 8.4 8.2  NEUTROABS 3.8  --   --  4.5 3.8 5.2 5.9  HGB 5.6*   < > 10.2* 9.9* 9.8* 9.6* 9.2*  HCT 17.6*   < > 30.5* 30.4* 29.8* 29.7* 28.3*  MCV 90.3  --   --  88.6 87.6 87.6 89.3  PLT 141*  --   --  127* 145* 163 201   < > = values in this interval not displayed.    Basic Metabolic Panel: Recent Labs  Lab 08/18/19 0443 08/19/19 0619 08/20/19 0235 08/21/19 0308 08/22/19 0640  NA 132* 133* 136 134* 130*  K 4.0 3.9 3.5 4.1 4.7  CL 105 106 110 110 103  CO2 22 18* 19* 19* 20*  GLUCOSE 215* 94 104* 73 447*  BUN 63* 43* 36* 29* 40*  CREATININE 1.22* 0.93 0.94 1.08* 0.94  CALCIUM 8.0* 8.9 8.9 8.8* 8.4*  MG 1.8 1.7 1.8 2.6* 2.1  PHOS 3.4 3.3 3.3  --  3.6    GFR: Estimated Creatinine Clearance: 51.4 mL/min (by C-G formula based on SCr of 0.94 mg/dL).  Liver Function Tests: Recent Labs  Lab 08/17/19 0950 08/18/19 0443 08/19/19 0619 08/20/19 0235 08/22/19 0640  AST 20 39 30 22 55*  ALT 20 30 24 21  41  ALKPHOS 72 51 58 75 266*  BILITOT 0.6 0.6 0.7 0.7 1.7*  PROT 7.8 5.6* 6.2* 6.3* 6.4*  ALBUMIN 1.9*  1.5* 2.0* 1.8* 1.8*    CBG: Recent Labs  Lab 08/21/19 1705 08/21/19 2121 08/21/19 2311 08/22/19 0323 08/22/19 0712  GLUCAP 98 270* 273* 331* 439*     Recent Results (from the past 240 hour(s))  SARS Coronavirus 2 by RT PCR (hospital order, performed in [facility] hospital lab) Nasopharyngeal Nasopharyngeal Swab     Status: None   Collection Time: 08/17/19  8:55 AM   Specimen: Nasopharyngeal Swab  Result Value Ref Range Status   SARS Coronavirus 2 NEGATIVE NEGATIVE Final    Comment: (NOTE) SARS-CoV-2 target nucleic acids are NOT DETECTED. The SARS-CoV-2 RNA is generally detectable in upper and lower respiratory specimens during the acute phase of infection. The lowest concentration of SARS-CoV-2 viral copies this assay can detect is 250 copies / mL. A negative result does not preclude SARS-CoV-2 infection and should not be used as the sole basis for treatment or other patient management decisions.  A negative result may occur with improper specimen collection / handling, submission of specimen other than nasopharyngeal swab, presence of viral mutation(s) within the areas targeted by this assay, and inadequate number of viral copies (<250 copies / mL). A negative result must be combined with clinical observations, patient history, and epidemiological information. Fact Sheet for Patients:   StrictlyIdeas.no Fact Sheet for Healthcare Providers: BankingDealers.co.za This test is not yet approved or cleared  by the [place] FDA and has been authorized for detection and/or diagnosis of SARS-CoV-2 by FDA under an Emergency Use Authorization (EUA).  This EUA will remain in effect (meaning this test can be used) for the duration of the COVID-19 declaration under Section 564(b)(1) of the Act, 21 U.S.C. section 360bbb-3(b)(1), unless the  authorization is terminated or revoked sooner. Performed at [facility], [place] [address] [place], [place] [address]   Urine culture     Status: Abnormal   Collection Time: 08/17/19  9:05 AM   Specimen: In/Out Cath Urine  Result Value Ref Range Status   Specimen Description IN/OUT CATH URINE  Final   Special Requests   Final    NONE Performed at [facility], [place] [address] [place], [place] [address]    Culture >=100,000 COLONIES/mL PROTEUS MIRABILIS (A)  Final   Report Status 08/20/2019 FINAL  Final   Organism ID, Bacteria PROTEUS MIRABILIS (A)  Final      Susceptibility   Proteus mirabilis - MIC*    AMPICILLIN <=2 SENSITIVE Sensitive     CEFAZOLIN <=4 SENSITIVE Sensitive     CEFTRIAXONE <=1 SENSITIVE Sensitive     CIPROFLOXACIN >=4 RESISTANT Resistant     GENTAMICIN <=1 SENSITIVE Sensitive     IMIPENEM 2 SENSITIVE Sensitive     NITROFURANTOIN 128 RESISTANT Resistant     TRIMETH/SULFA >=320 RESISTANT Resistant     AMPICILLIN/SULBACTAM <=2 SENSITIVE Sensitive     PIP/TAZO <=4 SENSITIVE Sensitive     * >=100,000 COLONIES/mL PROTEUS MIRABILIS  Blood Culture (routine x 2)     Status: Abnormal   Collection Time: 08/17/19  9:50 AM   Specimen: BLOOD LEFT ARM  Result Value Ref Range Status   Specimen Description BLOOD LEFT ARM  Final   Special Requests   Final    BOTTLES DRAWN AEROBIC AND ANAEROBIC Blood Culture adequate volume   Culture  Setup Time   Final    BUDDING YEAST SEEN IN BOTH AEROBIC AND ANAEROBIC BOTTLES CRITICAL RESULT CALLED TO, READ BACK BY AND VERIFIED WITH: Park Breed [id number] FCP Performed at [facility], [address], [place], [place] [address]    Culture CANDIDA GLABRATA (A)  Final   Report Status 08/21/2019 FINAL  Final  Blood Culture ID Panel (Reflexed)     Status: Abnormal   Collection Time: 08/17/19  9:50 AM  Result Value Ref Range Status   Enterococcus species NOT DETECTED NOT DETECTED Final   Listeria monocytogenes NOT DETECTED NOT DETECTED Final   Staphylococcus species NOT DETECTED NOT DETECTED Final    Staphylococcus aureus (BCID) NOT DETECTED NOT DETECTED Final   Streptococcus species NOT DETECTED NOT DETECTED Final   Streptococcus agalactiae NOT DETECTED NOT DETECTED Final   Streptococcus pneumoniae NOT DETECTED NOT DETECTED Final   Streptococcus pyogenes NOT DETECTED NOT DETECTED Final   Acinetobacter baumannii NOT DETECTED NOT DETECTED Final   Enterobacteriaceae species NOT DETECTED NOT DETECTED Final   Enterobacter cloacae complex NOT DETECTED NOT DETECTED Final   Escherichia coli NOT DETECTED NOT DETECTED Final   Klebsiella oxytoca NOT DETECTED NOT DETECTED Final   Klebsiella pneumoniae NOT DETECTED NOT DETECTED Final   Proteus species NOT DETECTED NOT DETECTED Final   Serratia marcescens NOT DETECTED NOT DETECTED Final   Haemophilus influenzae NOT DETECTED NOT DETECTED Final   Neisseria meningitidis NOT DETECTED NOT DETECTED Final   Pseudomonas aeruginosa NOT DETECTED NOT DETECTED Final   Candida albicans NOT DETECTED NOT DETECTED Final   Candida glabrata DETECTED (A) NOT DETECTED Final    Comment: CRITICAL RESULT CALLED TO, READ BACK BY AND VERIFIED WITH: PHARMD JEREMY F. [id number] FCP    Candida krusei NOT DETECTED NOT DETECTED Final   Candida parapsilosis NOT DETECTED NOT DETECTED Final   Candida tropicalis  NOT DETECTED NOT DETECTED Final    Comment: Performed at [facility], [place] [address], [place], [place] [address]  Blood Culture (routine x 2)     Status: Abnormal   Collection Time: 08/17/19  1:12 PM   Specimen: BLOOD LEFT ARM  Result Value Ref Range Status   Specimen Description BLOOD LEFT ARM  Final   Special Requests   Final    BOTTLES DRAWN AEROBIC ONLY Blood Culture adequate volume   Culture  Setup Time   Final    AEROBIC BOTTLE ONLY YEAST CRITICAL VALUE NOTED.  VALUE IS CONSISTENT WITH PREVIOUSLY REPORTED AND CALLED VALUE. Performed at [facility], [place] [address] [place], [place] [address]    Culture CANDIDA GLABRATA (A)  Final   Report  Status 08/21/2019 FINAL  Final  MRSA PCR Screening     Status: Abnormal   Collection Time: 08/17/19  8:23 PM   Specimen: Nasal Mucosa; Nasopharyngeal  Result Value Ref Range Status   MRSA by PCR POSITIVE (A) NEGATIVE Final    Comment:        The GeneXpert MRSA Assay (FDA approved for NASAL specimens only), is one component of a comprehensive MRSA colonization surveillance program. It is not intended to diagnose MRSA infection nor to guide or monitor treatment for MRSA infections. RESULT CALLED TO, READ BACK BY AND VERIFIED WITH: J. MILLER,CHARGE RN 1601 08/17/2019 Mena Goes Performed at [facility], [place] [address] [place], [place] [address]   Culture, blood (routine x 2)     Status: None (Preliminary result)   Collection Time: 08/19/19  5:56 AM   Specimen: BLOOD LEFT HAND  Result Value Ref Range Status   Specimen Description BLOOD LEFT HAND  Final   Special Requests   Final    BOTTLES DRAWN AEROBIC AND ANAEROBIC Blood Culture adequate volume   Culture   Final    NO GROWTH 3 DAYS Performed at [facility], [place] [address], [place], [place] [address]    Report Status PENDING  Incomplete  Culture, blood (routine x 2)     Status: None (Preliminary result)   Collection Time: 08/19/19  5:56 AM   Specimen: BLOOD LEFT HAND  Result Value Ref Range Status   Specimen Description BLOOD LEFT HAND  Final   Special Requests   Final    BOTTLES DRAWN AEROBIC AND ANAEROBIC Blood Culture adequate volume   Culture   Final    NO GROWTH 3 DAYS Performed at [facility], [place] [address] [place], [place] [address]    Report Status PENDING  Incomplete         Radiology Studies: [place] EKG SITE RITE  Result Date: 08/21/2019 If Site Rite image not attached, placement could not be confirmed due to current cardiac rhythm.       Scheduled Meds: . amLODipine  5 mg Oral Daily  . ascorbic acid  500 mg Oral BID  . Chlorhexidine Gluconate Cloth  6 each Topical Daily  .  escitalopram  20 mg Oral Daily  . feeding supplement (ENSURE ENLIVE)  237 mL Oral TID BM  . ferrous sulfate  300 mg Oral Q breakfast  . gabapentin  300 mg Oral BID  . insulin aspart  0-5 Units Subcutaneous QHS  . insulin aspart  0-9 Units Subcutaneous TID WC  . insulin glargine  5 Units Subcutaneous BH-q7a  . melatonin  3 mg Oral QHS  . metoprolol tartrate  50 mg  Oral BID  . mupirocin ointment  1 application Nasal BID  . pantoprazole  40 mg Oral BID  . sodium chloride flush  10-40 mL Intracatheter Q12H   Continuous Infusions: . anidulafungin Stopped (08/21/19 1514)     LOS: 5 days    Time spent: 40 minutes    Irine Seal, MD [facility]   To contact the attending provider between 7A-7P or the covering provider during after hours 7P-7A, please log into the web site [email or web address] and access using universal [place] password for that web site. If you do not have the password, please call the hospital operator.  08/22/2019, 9:31 AM

## 2019-08-22 NOTE — Plan of Care (Signed)

## 2019-08-22 NOTE — Progress Notes (Signed)
Nutrition Follow-up  DOCUMENTATION CODES:   Non-severe (moderate) malnutrition in context of chronic illness  INTERVENTION:   - d/c Ensure Enlive due to pt refusal  - Boost Breeze po TID, each supplement provides 250 kcal and 9 grams of protein  - Cyclic TPN per pharmacy  - Encourage adequate PO intake  NUTRITION DIAGNOSIS:   Moderate Malnutrition related to chronic illness (EC fistula, PVD, perforated prepyloric ulcer s/p ex-lap on chronic cyclic TPN) as evidenced by severe fat depletion, moderate muscle depletion.  Ongoing, being addressed via TPN  GOAL:   Patient will meet greater than or equal to 90% of their needs  Met via TPN  MONITOR:   PO intake, Supplement acceptance, Labs, Weight trends, Skin, I & O's  REASON FOR ASSESSMENT:   Malnutrition Screening Tool    ASSESSMENT:   59 year old female who presented to the ED on 6/06 with fever, dysuria. PMH of perforated prepyloric ulcer in 2020 s/p ex-lap with [name] patch, chronic cyclic TPN via right arm PICC, T1DM, bilateral lower quadrant drains along with enterocutaneous fistula with Eakin pouch, chronic stage II sacral pressure injuries, HTN, schizophrenia, PVD s/p bilateral BKA. Pt admitted with sepsis secondary to fungemia, source unclear at this time.  6/10 - PICC replaced, TPN restarted  Per outpatient records, pt is on a 14 hour TPN cycle. TPN held on admission until 6/10 due to candidemia. TPN infusing at 75 ml/hr for 14 hours starting at 1800 daily which provides 1471 kcal and 93 grams of protein.  Spoke with pt at bedside. Pt had completed ~50% of lunch meal tray. Pt states that she is still working on it and plans to eat the slice of cake.  Pt reports that Ensure Enlive makes her nauseous which is why she has been refusing them. Noted pt with Boost Breeze at bedside which she states that she enjoys and tolerates well. Will order TID.  Pt reports that she vomited earlier today but states that she cannot  remember what time. Discussed with RN who reports pt reported experiencing nausea so Zofran was administered. RN not aware that pt had a vomiting episode.  Meal Completion: 0-40%  Medications reviewed and include: vitamin C, ferrous sulfate, SSI, Lantus 5 units daily, protonix, Eraxis, TPN  Labs reviewed: sodium 130, hemoglobin 9.2 CBG's: 98-439 x 24 hours  Stool: 600 ml x 24 hours  Diet Order:   Diet Order            DIET SOFT Room service appropriate? Yes; Fluid consistency: Thin  Diet effective now                 EDUCATION NEEDS:   Not appropriate for education at this time  Skin:  Skin Assessment: Skin Integrity Issues: Stage IV: bilateral coccyx  Last BM:  08/22/19 type 7 (Eakin's pouch)  Height:   Ht Readings from Last 1 Encounters:  08/17/19 5' 2"  (1.575 m)    Weight:   Wt Readings from Last 1 Encounters:  08/17/19 49.9 kg    Ideal Body Weight:  43.5 kg (adjusted for bilateral BKA)  BMI:  Body mass index is 20.12 kg/m.  Estimated Nutritional Needs:   Kcal:  1500-1700  Protein:  65-80 grams  Fluid:  >/= 1.5 L    Gaynell Face, MS, RD, LDN Inpatient Clinical Dietitian Pager: [phone] Weekend/After Hours: [phone]

## 2019-08-22 NOTE — Care Management Important Message (Signed)
Important Message  Patient Details  Name: Gina [name] MRN: [id number] Date of Birth: [birth year]   Medicare Important Message Given:  Yes     Zenon Mayo, RN 08/22/2019, 5:30 PM

## 2019-08-22 NOTE — TOC Progression Note (Signed)
Transition of Care [facility]) - Progression Note    Patient Details  Name: Gina [name] MRN: [id number] Date of Birth: 1961-03-08  Transition of Care [facility]) CM/SW Contact  Zenon Mayo, RN Phone Number: 08/22/2019, 5:37 PM  Clinical Narrative:    Patient is set up with [facility] for HHPT, HHAIDE.  She was getting TPN thru [facility] but now Alvis Lemmings will take it over per Carolynn Sayers, so will need HHRN,HHPT, HHAIDE orders, prior to dc.    Expected Discharge Plan: [place] Barriers to Discharge: Continued Medical Work up  Expected Discharge Plan and Services Expected Discharge Plan: [place]   Discharge Planning Services: CM Consult Post Acute Care Choice: [place] arrangements for the past 2 months: Single Family Home                   DME Agency: NA       HH Arranged: PT, Nurse's Aide [place] Agency: [place] Date [place]: 08/20/19 Time [place]: 1538 Representative spoke with at [place]: [place] ([place]) Interventions    Readmission Risk Interventions Readmission Risk Prevention Plan 08/19/2019 02/10/2019 11/07/2017  Post Dischage Appt - - Patient refused  Medication Screening - - Complete  Transportation Screening Complete Complete Complete  PCP follow-up - - Patient refused  PCP or Specialist Appt within 3-5 Days - Not Complete -  Not Complete comments - DC date unknown- pt established with PCP -  [place] or [place] - Complete -  Social Work Consult for [place] Planning/Counseling - Not Complete -  SW consult not completed comments - awaiting call back from daughter and APS -  [place] - Not Complete -  Palliative Care Screening Not Complete Comments - pending need -  Medication Review (RN Care Manager) Complete Referral to [place] or Home Care Consult Complete - -  SW Recovery Care/Counseling Consult Complete - -   Palliative Care Screening Not Applicable - -  [place] Not Applicable - -  Some recent data might be hidden

## 2019-08-22 NOTE — Progress Notes (Signed)
Pt is stable. Denies pain, SOB and distress, Ostomy bag changed, sacral dressing Foam changed. TPN started from 1800, pt's appetite is low, CBG maintained, will continue to monitor  Palma Holter, RN

## 2019-08-22 NOTE — Progress Notes (Signed)
Inpatient Diabetes Program Recommendations  AACE/ADA: New Consensus Statement on Inpatient Glycemic Control (2015)  Target Ranges:  Prepandial:   less than 140 mg/dL      Peak postprandial:   less than 180 mg/dL (1-2 hours)      Critically ill patients:  140 - 180 mg/dL   Lab Results  Component Value Date   GLUCAP 439 (H) 08/22/2019   HGBA1C 7.0 (H) 08/18/2019    Review of Glycemic Control  Diabetes history: DM type 1 Outpatient Diabetes medications: none (strangely has not required basal insulin since later 2020) Current orders for Inpatient glycemic control:  Novolog 0-9 units tid + hs Lantus 5 units Daily  Ensure En[place] tid between meals TPN started back on 6/10 at 1800 this most likely is the cause of her hyperglycemia.  Inpatient Diabetes Program Recommendations:    Noted Lantus 5 units ordered this am when glucose increased to 439 mg/dl after TPN restarted last night. Will follow trends.  Thanks,  Tama Headings RN, MSN, BC-ADM Inpatient Diabetes Coordinator Team Pager [phone] (8a-5p)

## 2019-08-23 LAB — BASIC METABOLIC PANEL
Anion gap: 8 (ref 5–15)
BUN: 41 mg/dL — ABNORMAL HIGH (ref 6–20)
CO2: 21 mmol/L — ABNORMAL LOW (ref 22–32)
Calcium: 8.7 mg/dL — ABNORMAL LOW (ref 8.9–10.3)
Chloride: 105 mmol/L (ref 98–111)
Creatinine, Ser: 0.74 mg/dL (ref 0.44–1.00)
GFR calc Af Amer: >60 mL/min (ref >60)
GFR calc non Af Amer: >60 mL/min (ref >60)
Glucose, Bld: 234 mg/dL — ABNORMAL HIGH (ref 70–99)
Potassium: 4.4 mmol/L (ref 3.5–5.1)
Sodium: 134 mmol/L — ABNORMAL LOW (ref 135–145)

## 2019-08-23 LAB — GLUCOSE, CAPILLARY
Glucose-Capillary: 154 mg/dL — ABNORMAL HIGH (ref 70–99)
Glucose-Capillary: 156 mg/dL — ABNORMAL HIGH (ref 70–99)
Glucose-Capillary: 210 mg/dL — ABNORMAL HIGH (ref 70–99)
Glucose-Capillary: 216 mg/dL — ABNORMAL HIGH (ref 70–99)
Glucose-Capillary: 231 mg/dL — ABNORMAL HIGH (ref 70–99)
Glucose-Capillary: 234 mg/dL — ABNORMAL HIGH (ref 70–99)

## 2019-08-23 LAB — PHOSPHORUS: Phosphorus: 3.4 mg/dL (ref 2.5–4.6)

## 2019-08-23 LAB — MAGNESIUM: Magnesium: 2 mg/dL (ref 1.7–2.4)

## 2019-08-23 MED ORDER — GABAPENTIN 300 MG PO CAPS
300.0000 mg | ORAL_CAPSULE | Freq: Three times a day (TID) | ORAL | Status: DC
  Administered 2019-08-23 – 2019-08-29 (×20): 300 mg via ORAL
  Filled 2019-08-23 (×20): qty 1

## 2019-08-23 MED ORDER — INSULIN ASPART 100 UNIT/ML ~~LOC~~ SOLN
0.0000 [IU] | Freq: Three times a day (TID) | SUBCUTANEOUS | Status: DC
  Administered 2019-08-23: 3 [IU] via SUBCUTANEOUS
  Administered 2019-08-23: 2 [IU] via SUBCUTANEOUS
  Administered 2019-08-24 (×3): 1 [IU] via SUBCUTANEOUS
  Administered 2019-08-25: 2 [IU] via SUBCUTANEOUS
  Administered 2019-08-25 – 2019-08-26 (×3): 1 [IU] via SUBCUTANEOUS
  Administered 2019-08-26: 3 [IU] via SUBCUTANEOUS
  Administered 2019-08-26 (×2): 1 [IU] via SUBCUTANEOUS
  Administered 2019-08-27: 3 [IU] via SUBCUTANEOUS
  Administered 2019-08-27: 1 [IU] via SUBCUTANEOUS

## 2019-08-23 NOTE — Progress Notes (Signed)
[place] for Infectious Disease   Reason for visit: Follow up on Candidemia  Interval History: new positive C glabrata in blood culture at 4 days.   No fever, no chills.   Eraxis day 6  Physical Exam: Constitutional:  Vitals:   08/23/19 0745 08/23/19 0800  BP: 135/66 (!) 142/63  Pulse: 72 72  Resp: 16 11  Temp: 98.2 F (36.8 C)   SpO2: 97% 95%   patient appears in NAD Respiratory: Normal respiratory effort; CTA B Cardiovascular: RRR GI: soft, nt, nd  Review of Systems: Constitutional: negative for fevers and chills Gastrointestinal: negative for diarrhea  Lab Results  Component Value Date   WBC 8.2 08/22/2019   HGB 9.2 (L) 08/22/2019   HCT 28.3 (L) 08/22/2019   MCV 89.3 08/22/2019   PLT 201 08/22/2019    Lab Results  Component Value Date   CREATININE 0.74 08/23/2019   BUN 41 (H) 08/23/2019   NA 134 (L) 08/23/2019   K 4.4 08/23/2019   CL 105 08/23/2019   CO2 21 (L) 08/23/2019    Lab Results  Component Value Date   ALT 41 08/22/2019   AST 55 (H) 08/22/2019   ALKPHOS 266 (H) 08/22/2019     Microbiology: Recent Results (from the past 240 hour(s))  SARS Coronavirus 2 by RT PCR (hospital order, performed in [place] hospital lab) Nasopharyngeal Nasopharyngeal Swab     Status: None   Collection Time: 08/17/19  8:55 AM   Specimen: Nasopharyngeal Swab  Result Value Ref Range Status   SARS Coronavirus 2 NEGATIVE NEGATIVE Final    Comment: (NOTE) SARS-CoV-2 target nucleic acids are NOT DETECTED. The SARS-CoV-2 RNA is generally detectable in upper and lower respiratory specimens during the acute phase of infection. The lowest concentration of SARS-CoV-2 viral copies this assay can detect is 250 copies / mL. A negative result does not preclude SARS-CoV-2 infection and should not be used as the sole basis for treatment or other patient management decisions.  A negative result may occur with improper specimen collection / handling, submission of  specimen other than nasopharyngeal swab, presence of viral mutation(s) within the areas targeted by this assay, and inadequate number of viral copies (<250 copies / mL). A negative result must be combined with clinical observations, patient history, and epidemiological information. Fact Sheet for Patients:   StrictlyIdeas.no Fact Sheet for Healthcare Providers: BankingDealers.co.za This test is not yet approved or cleared  by the [place] FDA and has been authorized for detection and/or diagnosis of SARS-CoV-2 by FDA under an Emergency Use Authorization (EUA).  This EUA will remain in effect (meaning this test can be used) for the duration of the COVID-19 declaration under Section 564(b)(1) of the Act, 21 U.S.C. section 360bbb-3(b)(1), unless the authorization is terminated or revoked sooner. Performed at [facility], [place] [address], [place], [place] [address]   Urine culture     Status: Abnormal   Collection Time: 08/17/19  9:05 AM   Specimen: In/Out Cath Urine  Result Value Ref Range Status   Specimen Description IN/OUT CATH URINE  Final   Special Requests   Final    NONE Performed at [facility], [place] [address] [place], [place] [address]    Culture >=100,000 COLONIES/mL PROTEUS MIRABILIS (A)  Final   Report Status 08/20/2019 FINAL  Final   Organism ID, Bacteria PROTEUS MIRABILIS (A)  Final      Susceptibility   Proteus mirabilis - MIC*  AMPICILLIN <=2 SENSITIVE Sensitive     CEFAZOLIN <=4 SENSITIVE Sensitive     CEFTRIAXONE <=1 SENSITIVE Sensitive     CIPROFLOXACIN >=4 RESISTANT Resistant     GENTAMICIN <=1 SENSITIVE Sensitive     IMIPENEM 2 SENSITIVE Sensitive     NITROFURANTOIN 128 RESISTANT Resistant     TRIMETH/SULFA >=320 RESISTANT Resistant     AMPICILLIN/SULBACTAM <=2 SENSITIVE Sensitive     PIP/TAZO <=4 SENSITIVE Sensitive     * >=100,000 COLONIES/mL PROTEUS MIRABILIS  Blood Culture (routine x  2)     Status: Abnormal   Collection Time: 08/17/19  9:50 AM   Specimen: BLOOD LEFT ARM  Result Value Ref Range Status   Specimen Description BLOOD LEFT ARM  Final   Special Requests   Final    BOTTLES DRAWN AEROBIC AND ANAEROBIC Blood Culture adequate volume   Culture  Setup Time   Final    BUDDING YEAST SEEN IN BOTH AEROBIC AND ANAEROBIC BOTTLES CRITICAL RESULT CALLED TO, READ BACK BY AND VERIFIED WITH: Park Breed [id number] FCP Performed at [facility], [place] [address] [place], [place] [address]    Culture CANDIDA GLABRATA (A)  Final   Report Status 08/21/2019 FINAL  Final  Blood Culture ID Panel (Reflexed)     Status: Abnormal   Collection Time: 08/17/19  9:50 AM  Result Value Ref Range Status   Enterococcus species NOT DETECTED NOT DETECTED Final   Listeria monocytogenes NOT DETECTED NOT DETECTED Final   Staphylococcus species NOT DETECTED NOT DETECTED Final   Staphylococcus aureus (BCID) NOT DETECTED NOT DETECTED Final   Streptococcus species NOT DETECTED NOT DETECTED Final   Streptococcus agalactiae NOT DETECTED NOT DETECTED Final   Streptococcus pneumoniae NOT DETECTED NOT DETECTED Final   Streptococcus pyogenes NOT DETECTED NOT DETECTED Final   Acinetobacter baumannii NOT DETECTED NOT DETECTED Final   Enterobacteriaceae species NOT DETECTED NOT DETECTED Final   Enterobacter cloacae complex NOT DETECTED NOT DETECTED Final   Escherichia coli NOT DETECTED NOT DETECTED Final   Klebsiella oxytoca NOT DETECTED NOT DETECTED Final   Klebsiella pneumoniae NOT DETECTED NOT DETECTED Final   Proteus species NOT DETECTED NOT DETECTED Final   Serratia marcescens NOT DETECTED NOT DETECTED Final   Haemophilus influenzae NOT DETECTED NOT DETECTED Final   Neisseria meningitidis NOT DETECTED NOT DETECTED Final   Pseudomonas aeruginosa NOT DETECTED NOT DETECTED Final   Candida albicans NOT DETECTED NOT DETECTED Final   Candida glabrata DETECTED (A) NOT DETECTED Final     Comment: CRITICAL RESULT CALLED TO, READ BACK BY AND VERIFIED WITH: PHARMD JEREMY F. [id number] FCP    Candida krusei NOT DETECTED NOT DETECTED Final   Candida parapsilosis NOT DETECTED NOT DETECTED Final   Candida tropicalis NOT DETECTED NOT DETECTED Final    Comment: Performed at [facility], [name], [place], [place] [address]  Blood Culture (routine x 2)     Status: Abnormal   Collection Time: 08/17/19  1:12 PM   Specimen: BLOOD LEFT ARM  Result Value Ref Range Status   Specimen Description BLOOD LEFT ARM  Final   Special Requests   Final    BOTTLES DRAWN AEROBIC ONLY Blood Culture adequate volume   Culture  Setup Time   Final    AEROBIC BOTTLE ONLY YEAST CRITICAL VALUE NOTED.  VALUE IS CONSISTENT WITH PREVIOUSLY REPORTED AND CALLED VALUE. Performed at [facility], [place] [address], [place], [place] [address]  Culture CANDIDA GLABRATA (A)  Final   Report Status 08/21/2019 FINAL  Final  MRSA PCR Screening     Status: Abnormal   Collection Time: 08/17/19  8:23 PM   Specimen: Nasal Mucosa; Nasopharyngeal  Result Value Ref Range Status   MRSA by PCR POSITIVE (A) NEGATIVE Final    Comment:        The GeneXpert MRSA Assay (FDA approved for NASAL specimens only), is one component of a comprehensive MRSA colonization surveillance program. It is not intended to diagnose MRSA infection nor to guide or monitor treatment for MRSA infections. RESULT CALLED TO, READ BACK BY AND VERIFIED WITH: J. MILLER,CHARGE RN [id number] 08/17/2019 Mena Goes Performed at [facility], [place] [address], [place], [place] [address]   Culture, blood (routine x 2)     Status: None (Preliminary result)   Collection Time: 08/19/19  5:56 AM   Specimen: BLOOD LEFT HAND  Result Value Ref Range Status   Specimen Description BLOOD LEFT HAND  Final   Special Requests   Final    BOTTLES DRAWN AEROBIC AND ANAEROBIC Blood Culture adequate volume   Culture  Setup Time   Final    ANAEROBIC  BOTTLE ONLY YEAST CRITICAL VALUE NOTED.  VALUE IS CONSISTENT WITH PREVIOUSLY REPORTED AND CALLED VALUE.    Culture   Final    NO GROWTH 4 DAYS Performed at [facility], [place] [address], [place], [place] [address]    Report Status PENDING  Incomplete  Culture, blood (routine x 2)     Status: Abnormal (Preliminary result)   Collection Time: 08/19/19  5:56 AM   Specimen: BLOOD LEFT HAND  Result Value Ref Range Status   Specimen Description BLOOD LEFT HAND  Final   Special Requests   Final    BOTTLES DRAWN AEROBIC AND ANAEROBIC Blood Culture adequate volume   Culture  Setup Time (A)  Final    YEAST AEROBIC BOTTLE ONLY CRITICAL RESULT CALLED TO, READ BACK BY AND VERIFIED WITH: PHARMD Y PATEL [id number] AT 84 AM BY CM    Culture   Final    CULTURE REINCUBATED FOR BETTER GROWTH Performed at [facility], [place] [address] [place], [place] [address]    Report Status PENDING  Incomplete    Impression/Plan:  1. 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 New culture positive again.  I recommend a TEE with the new positive blood cultures  Will repeat blood cultures again today.    2.  Access - blood cultures again positive.  Picc line removed.    Allergies  Allergen Reactions  . Bee Venom Swelling    Swells up with bee stings   . Metformin And Related Other (See Comments)    Upset stomach

## 2019-08-23 NOTE — Progress Notes (Addendum)
.  PHARMACY - TOTAL PARENTERAL NUTRITION CONSULT NOTE   Indication: Fistula  Patient Measurements: Height: _0  (157.5 cm) Weight: 49.9 kg (110 lb) IBW/kg (Calculated) : 50.1 TPN AdjBW (KG): 49.9 Body mass index is 20.12 kg/m. Usual Weight: 53-60 kg  Assessment: 59 yo female presents on 08/17/2019 with fever, dysuria, and decreased intake. On presentation, patient found to have blood glucose of > 600 and started on insulin gtt. Prior to admission patient is on chronic cyclic TPN via R arm PICC and soft diet. TPN was initiated in 02/2019 after perforated prepyloric ulcer which was repaired with a [name] patch and continued to leak. Patient currently has an Chief Strategy Officer in place from January 2021 due to [facility] fistula.   Per patient, decreased appetite and po intake for previous 3-4 days prior to presentation. She reports transitioning from a normal diet of foods such as grits, chicken, tuna, chicken noodle soup, chips, jello, and pudding, to full liquid diet of mainly soup. Per outpatient records, she is on a 14-hour TPN cycle.   TPN was held on admission until 6/10 due to candidemia which is being treated with anidulafungin. PICC line was pulled and is being reinserted 6/10. Pharmacy consulted to resume TPN on 08/21/19.  Glucose / Insulin: Hx of DM (controlled by diet outpt). Hyperglycemia on admission started on insulin gtt, then stable for days. CBGs improved but remain uncontrolled back on continuous TPN, ranging from 210-234. Lantus 5 units daily per MD. Irena Reichmann 12 units sensitive SSI in last 24 hrs  Electrolytes: Na 134. Corrected Ca 10.4. CO2 21. Others wnl Renal: Scr down to 0.74, baseline Scr 0.6-0.8 LFTs / TGs: Alk phos 266 - increase. AST 55 - increase. ALT wnl. Tbili 1.7 - increase. TG WNL Prealbumin / albumin: prealbumin 8.9, albumin 1.8. Intake / Output; MIVF: LBM 6/11 GI Imaging: 6/7 CT abd - ongoing enterocutaneous fistula, no significant change in size of fluid collection despite  removng perc drainage catheter Surgeries / Procedures:   Central access: PICC 6/10 TPN start date: Chronic TPN prior to admission since 02/2019; TPN resumed on 6/10  Nutritional Goals (per outpatient records): kCal: 1471 kcal, Protein: 93 g, Fluid: 1800 ml Goal TPN rate is 75 mL/hr (provides 90g of protein and 1476 kcals per day)  RD goals per note 6/7: KCal: 1500-1700, Protein: 65-80 g, Fluid: >/=1.5L  Current Nutrition:  Boost Breeze PO TID Resumed home 18-HT cyclic TPN on 0/93, back to continuous 24-hr cycle 6/11  Plan:  -Patient's blood cultures now persistently positive, resulting with candida glabrata again this morning. Per Dr. [place] Silos discussion with Dr. Linus Salmons, will d/c PICC line again today. TPN will be held until replaced -D/c Lantus, as CBGs were controlled prior to resuming TPN -Per Dr. [place] Silos, hold for now but will hang D5W at same rate as TPN at 75 ml/hr if CBGs drop   Arturo Morton, PharmD, BCPS Please check AMION for all [place] contact numbers Clinical Pharmacist 08/23/2019 7:28 AM

## 2019-08-23 NOTE — Progress Notes (Signed)
PICC line removed per order and line is intact. Vaseline gauze and gauze dressing applied and pressure held. Site clean, dry, and intact. Patient and RN aware that patient is on bedrest for 30 minutes. Patient aware to leave dressing on and dry for 24 hours.

## 2019-08-23 NOTE — Progress Notes (Signed)
PROGRESS NOTE    Gina [name]  OMV:[id number] DOB: 1960/03/29 DOA: 08/17/2019 PCP: [name], MD   Chief Complaint  Patient presents with  . Fever    Brief Narrative:  Gina [name] a 59 y.o.femalewith medical history significant ofperforated prepyloric ulcerin 2020 s/pexlap with [name] patch, chronic TPN via right arm PICC, IDDM, bilateral lower quadrant drains along with enterocutaneous fistula with colostomy bag, Chronic sacral pressure ulcers stage II,HTN, schizophrenia, bilateral BKA, PVD s/p B/L BKA,sent from from home for evaluation of fever. She was admitted few times this year for sepsis, including MRSAbacteremia fungemia in [place] peritonitis in April andProteus UTIin May.Pt on TPN and she also take soft diet to improve her nutrition condition. Daughter noticed that pt has had decreased appetite for last 3-4 day and was warm to touch last night and complained about feeling of nausea but no vomiting. No abd pain. She also seemed to have burning sensation when urinated. Daughter reported that she does wound care everyday herself and did not see significant changes associated with the sacral wounds. Daughter also reported that pt Glucose has been fairly controlled at home seldom >200. ED Course:Cre1.4, CrCL 35 ml/min, Tmax 103.1, WBC WNL, LA 1.8. UA showed WBC 11-20/hpf. Xray no infiltrates. GLU >700    Assessment & Plan:   Principal Problem:   Fever Active Problems:   HTN (hypertension)   Diabetes (HCC)   Anemia of chronic disease   Sepsis ([place])   Schizophrenia ([place])   Acute kidney injury ([place])   Sacral decubitus ulcer   Normocytic anemia   S/P BKA (below knee amputation) bilateral (HCC)   Status post colostomy (HCC)   Enterocutaneous fistula   Urinary tract infection due to Proteus   Iron deficiency anemia  1 sepsis secondary to Candida glabrata fungemia, POA Patient presented with fevers, normal white count, tachycardic, normal  respiratory rate.  Blood cultures from 08/17/2019 with 1/2+ for Candida.  Repeat blood cultures from 08/19/2019 initially with no growth to date however this morning are positive for yeast/Candida.  Patient noted to have been on TPN with a PICC line.  PICC line had been removed for line holiday pending repeat blood cultures.  CT abdomen and pelvis which was done with no significant change in size of fluid collection.  Patient seen by ophthalmology (08/19/2019) and endophthalmitis ruled out.  Continue IV Eraxis day #6/14.  2D echo which was done was negative for endocarditis.  Patient had PICC line placed back in on 08/21/2019 as blood cultures continue to have remained negative.  However this morning blood cultures from 08/19/2019 are positive with yeast.  Remove PICC line.  Repeat blood cultures ordered per ID.  Once repeat blood cultures from 08/23/2019 and negative for 5 days for per ID recommendations, PICC line may be placed back in.  Patient will need a total of 14 days of treatment per ID recommendations.  ID following and I appreciate the input and recommendations.    2.  History of perforated gastric ulcer status post oversewing and Graham patch 01/2019/enterocutaneous fistula CT abdomen and pelvis with persistent fluid collection within ventral aspect of peritoneal cavity with continued fistula to skin surface at level of umbilicus, presumably ongoing enterocutaneous fistula, no significant change in size.  Per general surgical notes from 06/2019, appears developed enterocutaneous fistula postop from perforated ulcer treated with oversewing and [name] patch.  Wound care following.  PPI.  3.  Diabetes mellitus type 2 Hemoglobin A1c 7.0.  CBG of 210 this morning.  Elevated blood glucose levels likely secondary to TPN.  Patient started on Lantus 5 units daily.  Patient with repeat positive blood cultures and as such PICC line will be removed and TPN held.  Discontinue Lantus for now.  Continue sliding scale  insulin.  Change CBGs to before meals and at bedtime.  Outpatient follow-up with PCP.  Increase Neurontin.  4.  Anemia/iron deficiency anemia of chronic illness Patient noted to have a drop in hemoglobin with no signs of overt bleeding felt likely dilutional.  Status post 2 units packed red blood cells hemoglobin currently stable at 9.2.  Patient noted to be having brown stool however Hemoccult was positive.  If patient continues to have further drop in hemoglobin will need to consult with GI for further evaluation.  Status post IV Feraheme.  Continue oral iron supplementation.  Outpatient follow-up. Follow H&H.  5.  Acute kidney injury Resolved with hydration.  6.  Proteus mirabilis UTI Patient on presentation had complaints of dysuria.  Urine cultures with > 100,000 colonies of Proteus mirabilis.  Clinical improvement.  Afebrile.  Status post 2 days IV cefepime and 1 dose of IV Rocephin.  No further antibiotics needed.  Discussed with ID.  7.  Hypertension Stable.  Continue Norvasc and Lopressor.  8.  Stage II pressure ulcer, chronic, POA Patient has been assessed by wound care.  Continue current recommendations per wound care.  Pressure Injury 05/22/19 Coccyx Bilateral Stage 4 - Full thickness tissue loss with exposed bone, tendon or muscle. Per wound nurse, healing stage IV from prior admission (Active)  05/22/19 2104  Location: Coccyx  Location Orientation: Bilateral  Staging: Stage 4 - Full thickness tissue loss with exposed bone, tendon or muscle.  Wound Description (Comments): Per wound nurse, healing stage IV from prior admission  Present on Admission: Yes     Pressure Injury 05/22/19 Knee Right;Distal Unstageable - Full thickness tissue loss in which the base of the injury is covered by slough (yellow, tan, gray, green or brown) and/or eschar (tan, brown or black) in the wound bed. Stump (Active)  05/22/19 2106  Location: Knee  Location Orientation: Right;Distal  Staging:  Unstageable - Full thickness tissue loss in which the base of the injury is covered by slough (yellow, tan, gray, green or brown) and/or eschar (tan, brown or black) in the wound bed.  Wound Description (Comments): Stump  Present on Admission: Yes     Pressure Injury 05/22/19 Knee Left;Distal Unstageable - Full thickness tissue loss in which the base of the injury is covered by slough (yellow, tan, gray, green or brown) and/or eschar (tan, brown or black) in the wound bed. Stump (Active)  05/22/19 2108  Location: Knee  Location Orientation: Left;Distal  Staging: Unstageable - Full thickness tissue loss in which the base of the injury is covered by slough (yellow, tan, gray, green or brown) and/or eschar (tan, brown or black) in the wound bed.  Wound Description (Comments): Stump  Present on Admission: Yes     Pressure Injury 05/22/19 Thigh Left;Mid Stage 2 -  Partial thickness loss of dermis presenting as a shallow open injury with a red, pink wound bed without slough. (Active)  05/22/19 2109  Location: Thigh  Location Orientation: Left;Mid  Staging: Stage 2 -  Partial thickness loss of dermis presenting as a shallow open injury with a red, pink wound bed without slough.  Wound Description (Comments):   Present on Admission: Yes     Pressure Injury 07/22/19 Sacrum Mid Stage 3 -  Full thickness tissue loss. Subcutaneous fat may be visible but bone, tendon or muscle are NOT exposed. (Active)  07/22/19   Location: Sacrum  Location Orientation: Mid  Staging: Stage 3 -  Full thickness tissue loss. Subcutaneous fat may be visible but bone, tendon or muscle are NOT exposed.  Wound Description (Comments):   Present on Admission: Yes     Pressure Injury 07/22/19 Leg Left Stage 3 -  Full thickness tissue loss. Subcutaneous fat may be visible but bone, tendon or muscle are NOT exposed. (Active)  07/22/19   Location: Leg  Location Orientation: Left  Staging: Stage 3 -  Full thickness tissue loss.  Subcutaneous fat may be visible but bone, tendon or muscle are NOT exposed.  Wound Description (Comments):   Present on Admission: Yes     Pressure Injury 07/22/19 Leg Right Stage 3 -  Full thickness tissue loss. Subcutaneous fat may be visible but bone, tendon or muscle are NOT exposed. (Active)  07/22/19   Location: Leg  Location Orientation: Right  Staging: Stage 3 -  Full thickness tissue loss. Subcutaneous fat may be visible but bone, tendon or muscle are NOT exposed.  Wound Description (Comments):   Present on Admission: Yes        DVT prophylaxis: Lovenox on hold due to anemia and positive Hemoccult. Code Status: DNR Family Communication: Updated patient.  No family at bedside. Disposition:   Status is: Inpatient    Dispo: The patient is from: Home              Anticipated d/c is to: Home with home health.              Anticipated d/c date is: Hopefully 1 to 2 days once blood glucose levels have improved..              Patient currently on IV Eraxis, Candida bacteremia, PICC line placed back in 08/21/2019 and TPN started.  Patient now with blood cultures coming back positive from 08/19/2019 and as such PICC line being removed today's, 08/23/2019.  Not ready for discharge.         Consultants:   Ophthalmology: Dr. Ellie Lunch 08/19/2019  ID: Dr. Megan Salon 08/17/2019  Procedures:   CT abdomen and pelvis 08/18/2019  Chest x-ray 08/17/2019  2D echo 08/18/2019  PICC line removal  2 units packed red blood cell transfusion 08/18/2019  PICC line 08/21/2019  PICC line removal 08/23/2019  Antimicrobials:  IV Eraxis 08/18/2019>>>> 08/31/2019  IV cefepime 08/17/2019>>>> 08/18/2019  IV Rocephin 6/9/2021x1 dose   Subjective: Sitting up in bed.  Complaining of nausea.  Had an episode of small emesis today.  States she feels she may have tried to eat too much.  Denies any chest pain.  No shortness of breath.  Complaining of pain from blood pressure cuff.  Complaining of neuropathic pain in  bilateral BKA.    Objective: Vitals:   08/23/19 0011 08/23/19 0352 08/23/19 0745 08/23/19 0800  BP: 132/66 (!) 131/59 135/66 (!) 142/63  Pulse: 71 72 72 72  Resp: 14 18 16 11   Temp: 98.2 F (36.8 C) 98 F (36.7 C) 98.2 F (36.8 C)   TempSrc: Oral Oral Oral   SpO2: 96% 95% 97% 95%  Weight:      Height:        Intake/Output Summary (Last 24 hours) at 08/23/2019 1025 Last data filed at 08/23/2019 0800 Gross per 24 hour  Intake 603.16 ml  Output 1325 ml  Net -721.84 ml  Filed Weights   08/17/19 0900  Weight: 49.9 kg    Examination:  General exam: NAD Respiratory system: Lungs clear to auscultation bilaterally.  No wheezes, no crackles, no rhonchi.  Normal respiratory effort.  Speaking in full sentences.   Cardiovascular system: Regular rate rhythm no murmurs rubs or gallops.  No JVD.  Gastrointestinal system: Abdomen is soft, nontender, nondistended, positive bowel sounds.  Ostomy with loose stool noted.  Central nervous system: Alert and oriented. No focal neurological deficits. Extremities: Status post bilateral BKA.  Skin: No rashes, lesions or ulcers Psychiatry: Judgement and insight appear normal. Mood & affect appropriate.     Data Reviewed: I have personally reviewed following labs and imaging studies  CBC: Recent Labs  Lab 08/18/19 0443 08/18/19 0443 08/18/19 2109 08/19/19 0619 08/20/19 0235 08/21/19 0308 08/22/19 0640  WBC 5.0  --   --  6.5 6.2 8.4 8.2  NEUTROABS 3.8  --   --  4.5 3.8 5.2 5.9  HGB 5.6*   < > 10.2* 9.9* 9.8* 9.6* 9.2*  HCT 17.6*   < > 30.5* 30.4* 29.8* 29.7* 28.3*  MCV 90.3  --   --  88.6 87.6 87.6 89.3  PLT 141*  --   --  127* 145* 163 201   < > = values in this interval not displayed.    Basic Metabolic Panel: Recent Labs  Lab 08/18/19 0443 08/18/19 0443 08/19/19 0086 08/20/19 0235 08/21/19 0308 08/22/19 0640 08/23/19 0243  NA 132*   < > 133* 136 134* 130* 134*  K 4.0   < > 3.9 3.5 4.1 4.7 4.4  CL 105   < > 106 110 110  103 105  CO2 22   < > 18* 19* 19* 20* 21*  GLUCOSE 215*   < > 94 104* 73 447* 234*  BUN 63*   < > 43* 36* 29* 40* 41*  CREATININE 1.22*   < > 0.93 0.94 1.08* 0.94 0.74  CALCIUM 8.0*   < > 8.9 8.9 8.8* 8.4* 8.7*  MG 1.8   < > 1.7 1.8 2.6* 2.1 2.0  PHOS 3.4  --  3.3 3.3  --  3.6 3.4   < > = values in this interval not displayed.    GFR: Estimated Creatinine Clearance: 60.4 mL/min (by C-G formula based on SCr of 0.74 mg/dL).  Liver Function Tests: Recent Labs  Lab 08/17/19 0950 08/18/19 0443 08/19/19 0619 08/20/19 0235 08/22/19 0640  AST 20 39 30 22 55*  ALT 20 30 24 21  41  ALKPHOS 72 51 58 75 266*  BILITOT 0.6 0.6 0.7 0.7 1.7*  PROT 7.8 5.6* 6.2* 6.3* 6.4*  ALBUMIN 1.9* 1.5* 2.0* 1.8* 1.8*    CBG: Recent Labs  Lab 08/22/19 1123 08/22/19 1602 08/22/19 1937 08/23/19 0010 08/23/19 0347  GLUCAP 174* 128* 210* 234* 210*     Recent Results (from the past 240 hour(s))  SARS Coronavirus 2 by RT PCR (hospital order, performed in [facility] hospital lab) Nasopharyngeal Nasopharyngeal Swab     Status: None   Collection Time: 08/17/19  8:55 AM   Specimen: Nasopharyngeal Swab  Result Value Ref Range Status   SARS Coronavirus 2 NEGATIVE NEGATIVE Final    Comment: (NOTE) SARS-CoV-2 target nucleic acids are NOT DETECTED. The SARS-CoV-2 RNA is generally detectable in upper and lower respiratory specimens during the acute phase of infection. The lowest concentration of SARS-CoV-2 viral copies this assay can detect is 250 copies / mL. A negative result does  not preclude SARS-CoV-2 infection and should not be used as the sole basis for treatment or other patient management decisions.  A negative result may occur with improper specimen collection / handling, submission of specimen other than nasopharyngeal swab, presence of viral mutation(s) within the areas targeted by this assay, and inadequate number of viral copies (<250 copies / mL). A negative result must be combined with  clinical observations, patient history, and epidemiological information. Fact Sheet for Patients:   StrictlyIdeas.no Fact Sheet for Healthcare Providers: BankingDealers.co.za This test is not yet approved or cleared  by the [place] FDA and has been authorized for detection and/or diagnosis of SARS-CoV-2 by FDA under an Emergency Use Authorization (EUA).  This EUA will remain in effect (meaning this test can be used) for the duration of the COVID-19 declaration under Section 564(b)(1) of the Act, 21 U.S.C. section 360bbb-3(b)(1), unless the authorization is terminated or revoked sooner. Performed at [facility], [place] [address], [place], [place] [address]   Urine culture     Status: Abnormal   Collection Time: 08/17/19  9:05 AM   Specimen: In/Out Cath Urine  Result Value Ref Range Status   Specimen Description IN/OUT CATH URINE  Final   Special Requests   Final    NONE Performed at [facility], [place] [address], [place], [place] [address]    Culture >=100,000 COLONIES/mL PROTEUS MIRABILIS (A)  Final   Report Status 08/20/2019 FINAL  Final   Organism ID, Bacteria PROTEUS MIRABILIS (A)  Final      Susceptibility   Proteus mirabilis - MIC*    AMPICILLIN <=2 SENSITIVE Sensitive     CEFAZOLIN <=4 SENSITIVE Sensitive     CEFTRIAXONE <=1 SENSITIVE Sensitive     CIPROFLOXACIN >=4 RESISTANT Resistant     GENTAMICIN <=1 SENSITIVE Sensitive     IMIPENEM 2 SENSITIVE Sensitive     NITROFURANTOIN 128 RESISTANT Resistant     TRIMETH/SULFA >=320 RESISTANT Resistant     AMPICILLIN/SULBACTAM <=2 SENSITIVE Sensitive     PIP/TAZO <=4 SENSITIVE Sensitive     * >=100,000 COLONIES/mL PROTEUS MIRABILIS  Blood Culture (routine x 2)     Status: Abnormal   Collection Time: 08/17/19  9:50 AM   Specimen: BLOOD LEFT ARM  Result Value Ref Range Status   Specimen Description BLOOD LEFT ARM  Final   Special Requests   Final    BOTTLES DRAWN  AEROBIC AND ANAEROBIC Blood Culture adequate volume   Culture  Setup Time   Final    BUDDING YEAST SEEN IN BOTH AEROBIC AND ANAEROBIC BOTTLES CRITICAL RESULT CALLED TO, READ BACK BY AND VERIFIED WITH: Park Breed [phone] FCP Performed at [facility], [address] [place], [place] [address]    Culture CANDIDA GLABRATA (A)  Final   Report Status 08/21/2019 FINAL  Final  Blood Culture ID Panel (Reflexed)     Status: Abnormal   Collection Time: 08/17/19  9:50 AM  Result Value Ref Range Status   Enterococcus species NOT DETECTED NOT DETECTED Final   Listeria monocytogenes NOT DETECTED NOT DETECTED Final   Staphylococcus species NOT DETECTED NOT DETECTED Final   Staphylococcus aureus (BCID) NOT DETECTED NOT DETECTED Final   Streptococcus species NOT DETECTED NOT DETECTED Final   Streptococcus agalactiae NOT DETECTED NOT DETECTED Final   Streptococcus pneumoniae NOT DETECTED NOT DETECTED Final   Streptococcus pyogenes NOT DETECTED NOT DETECTED Final   Acinetobacter baumannii NOT DETECTED NOT DETECTED Final  Enterobacteriaceae species NOT DETECTED NOT DETECTED Final   Enterobacter cloacae complex NOT DETECTED NOT DETECTED Final   Escherichia coli NOT DETECTED NOT DETECTED Final   Klebsiella oxytoca NOT DETECTED NOT DETECTED Final   Klebsiella pneumoniae NOT DETECTED NOT DETECTED Final   Proteus species NOT DETECTED NOT DETECTED Final   Serratia marcescens NOT DETECTED NOT DETECTED Final   Haemophilus influenzae NOT DETECTED NOT DETECTED Final   Neisseria meningitidis NOT DETECTED NOT DETECTED Final   Pseudomonas aeruginosa NOT DETECTED NOT DETECTED Final   Candida albicans NOT DETECTED NOT DETECTED Final   Candida glabrata DETECTED (A) NOT DETECTED Final    Comment: CRITICAL RESULT CALLED TO, READ BACK BY AND VERIFIED WITH: PHARMD JEREMY F. [phone] FCP    Candida krusei NOT DETECTED NOT DETECTED Final   Candida parapsilosis NOT DETECTED NOT DETECTED Final    Candida tropicalis NOT DETECTED NOT DETECTED Final    Comment: Performed at [facility], [place] [address] [place], [place] [address]  Blood Culture (routine x 2)     Status: Abnormal   Collection Time: 08/17/19  1:12 PM   Specimen: BLOOD LEFT ARM  Result Value Ref Range Status   Specimen Description BLOOD LEFT ARM  Final   Special Requests   Final    BOTTLES DRAWN AEROBIC ONLY Blood Culture adequate volume   Culture  Setup Time   Final    AEROBIC BOTTLE ONLY YEAST CRITICAL VALUE NOTED.  VALUE IS CONSISTENT WITH PREVIOUSLY REPORTED AND CALLED VALUE. Performed at [facility], [place] [address], [place], [place] [address]    Culture CANDIDA GLABRATA (A)  Final   Report Status 08/21/2019 FINAL  Final  MRSA PCR Screening     Status: Abnormal   Collection Time: 08/17/19  8:23 PM   Specimen: Nasal Mucosa; Nasopharyngeal  Result Value Ref Range Status   MRSA by PCR POSITIVE (A) NEGATIVE Final    Comment:        The GeneXpert MRSA Assay (FDA approved for NASAL specimens only), is one component of a comprehensive MRSA colonization surveillance program. It is not intended to diagnose MRSA infection nor to guide or monitor treatment for MRSA infections. RESULT CALLED TO, READ BACK BY AND VERIFIED WITH: J. MILLER,CHARGE RN [id number] 08/17/2019 Mena Goes Performed at [facility], [place] [address], [place], [place] [address]   Culture, blood (routine x 2)     Status: None (Preliminary result)   Collection Time: 08/19/19  5:56 AM   Specimen: BLOOD LEFT HAND  Result Value Ref Range Status   Specimen Description BLOOD LEFT HAND  Final   Special Requests   Final    BOTTLES DRAWN AEROBIC AND ANAEROBIC Blood Culture adequate volume   Culture  Setup Time   Final    ANAEROBIC BOTTLE ONLY YEAST CRITICAL VALUE NOTED.  VALUE IS CONSISTENT WITH PREVIOUSLY REPORTED AND CALLED VALUE.    Culture   Final    NO GROWTH 4 DAYS Performed at [facility], [place] [address] [place],  [place] [address]    Report Status PENDING  Incomplete  Culture, blood (routine x 2)     Status: Abnormal (Preliminary result)   Collection Time: 08/19/19  5:56 AM   Specimen: BLOOD LEFT HAND  Result Value Ref Range Status   Specimen Description BLOOD LEFT HAND  Final   Special Requests   Final    BOTTLES DRAWN AEROBIC AND ANAEROBIC Blood Culture adequate volume   Culture  Setup Time (A)  Final    YEAST AEROBIC BOTTLE ONLY CRITICAL RESULT CALLED TO, READ BACK BY AND VERIFIED WITH: PHARMD Y PATEL [id number] AT 59 AM BY CM    Culture   Final    CULTURE REINCUBATED FOR BETTER GROWTH Performed at [facility], [address], [place], [place] [address]    Report Status PENDING  Incomplete         Radiology Studies: No results found.      Scheduled Meds: . amLODipine  5 mg Oral Daily  . ascorbic acid  500 mg Oral BID  . Chlorhexidine Gluconate Cloth  6 each Topical Daily  . escitalopram  20 mg Oral Daily  . feeding supplement  1 Container Oral TID BM  . ferrous sulfate  300 mg Oral Q breakfast  . gabapentin  300 mg Oral BID  . insulin aspart  0-9 Units Subcutaneous Q4H  . melatonin  3 mg Oral QHS  . metoprolol tartrate  50 mg Oral BID  . pantoprazole  40 mg Oral BID  . sodium chloride flush  10-40 mL Intracatheter Q12H   Continuous Infusions: . anidulafungin 100 mg (08/22/19 1229)  . TPN ADULT (ION) 75 mL/hr at 08/22/19 1745     LOS: 6 days    Time spent: 35 minutes    Irine Seal, MD [facility]   To contact the attending provider between 7A-7P or the covering provider during after hours 7P-7A, please log into the web site [email or web address] and access using universal [place] password for that web site. If you do not have the password, please call the hospital operator.  08/23/2019, 10:25 AM

## 2019-08-23 NOTE — Progress Notes (Signed)
PHARMACY - PHYSICIAN COMMUNICATION CRITICAL VALUE ALERT - BLOOD CULTURE IDENTIFICATION (BCID)  Gina [name] is an 59 y.o. female with known candida glabrata bacteremia  Assessment:  1 aerobic bottle of repeat blood cx set from 6/8 positive for yeast  Name of physician (or Provider) Contacted: Dr Linus Salmons  Patient to remain on eraxis  Results for orders placed or performed during the hospital encounter of 08/17/19  Blood Culture ID Panel (Reflexed) (Collected: 08/17/2019  9:50 AM)  Result Value Ref Range   Enterococcus species NOT DETECTED NOT DETECTED   Listeria monocytogenes NOT DETECTED NOT DETECTED   Staphylococcus species NOT DETECTED NOT DETECTED   Staphylococcus aureus (BCID) NOT DETECTED NOT DETECTED   Streptococcus species NOT DETECTED NOT DETECTED   Streptococcus agalactiae NOT DETECTED NOT DETECTED   Streptococcus pneumoniae NOT DETECTED NOT DETECTED   Streptococcus pyogenes NOT DETECTED NOT DETECTED   Acinetobacter baumannii NOT DETECTED NOT DETECTED   Enterobacteriaceae species NOT DETECTED NOT DETECTED   Enterobacter cloacae complex NOT DETECTED NOT DETECTED   Escherichia coli NOT DETECTED NOT DETECTED   Klebsiella oxytoca NOT DETECTED NOT DETECTED   Klebsiella pneumoniae NOT DETECTED NOT DETECTED   Proteus species NOT DETECTED NOT DETECTED   Serratia marcescens NOT DETECTED NOT DETECTED   Haemophilus influenzae NOT DETECTED NOT DETECTED   Neisseria meningitidis NOT DETECTED NOT DETECTED   Pseudomonas aeruginosa NOT DETECTED NOT DETECTED   Candida albicans NOT DETECTED NOT DETECTED   Candida glabrata DETECTED (A) NOT DETECTED   Candida krusei NOT DETECTED NOT DETECTED   Candida parapsilosis NOT DETECTED NOT DETECTED   Candida tropicalis NOT DETECTED NOT DETECTED   Barth Kirks, PharmD, BCPS, BCCCP Clinical Pharmacist [phone]  Please check AMION for all [place] numbers  08/23/2019 8:41 AM

## 2019-08-24 LAB — GLUCOSE, CAPILLARY
Glucose-Capillary: 121 mg/dL — ABNORMAL HIGH (ref 70–99)
Glucose-Capillary: 123 mg/dL — ABNORMAL HIGH (ref 70–99)
Glucose-Capillary: 132 mg/dL — ABNORMAL HIGH (ref 70–99)
Glucose-Capillary: 144 mg/dL — ABNORMAL HIGH (ref 70–99)
Glucose-Capillary: 144 mg/dL — ABNORMAL HIGH (ref 70–99)

## 2019-08-24 LAB — CBC WITH DIFFERENTIAL/PLATELET
Abs Immature Granulocytes: 0.04 10*3/uL (ref 0.00–0.07)
Basophils Absolute: 0 10*3/uL (ref 0.0–0.1)
Basophils Relative: 0 %
Eosinophils Absolute: 0.1 10*3/uL (ref 0.0–0.5)
Eosinophils Relative: 1 %
HCT: 28.1 % — ABNORMAL LOW (ref 36.0–46.0)
Hemoglobin: 8.7 g/dL — ABNORMAL LOW (ref 12.0–15.0)
Immature Granulocytes: 1 %
Lymphocytes Relative: 22 %
Lymphs Abs: 1.8 10*3/uL (ref 0.7–4.0)
MCH: 28.4 pg (ref 26.0–34.0)
MCHC: 31 g/dL (ref 30.0–36.0)
MCV: 91.8 fL (ref 80.0–100.0)
Monocytes Absolute: 1.2 10*3/uL — ABNORMAL HIGH (ref 0.1–1.0)
Monocytes Relative: 15 %
Neutro Abs: 4.9 10*3/uL (ref 1.7–7.7)
Neutrophils Relative %: 61 %
Platelets: 279 10*3/uL (ref 150–400)
RBC: 3.06 MIL/uL — ABNORMAL LOW (ref 3.87–5.11)
RDW: 16.2 % — ABNORMAL HIGH (ref 11.5–15.5)
WBC: 8 10*3/uL (ref 4.0–10.5)
nRBC: 0 % (ref 0.0–0.2)

## 2019-08-24 LAB — CULTURE, BLOOD (ROUTINE X 2): Special Requests: ADEQUATE

## 2019-08-24 LAB — BASIC METABOLIC PANEL
Anion gap: 8 (ref 5–15)
BUN: 40 mg/dL — ABNORMAL HIGH (ref 6–20)
CO2: 19 mmol/L — ABNORMAL LOW (ref 22–32)
Calcium: 8.8 mg/dL — ABNORMAL LOW (ref 8.9–10.3)
Chloride: 108 mmol/L (ref 98–111)
Creatinine, Ser: 0.8 mg/dL (ref 0.44–1.00)
GFR calc Af Amer: >60 mL/min (ref >60)
GFR calc non Af Amer: >60 mL/min (ref >60)
Glucose, Bld: 152 mg/dL — ABNORMAL HIGH (ref 70–99)
Potassium: 4.5 mmol/L (ref 3.5–5.1)
Sodium: 135 mmol/L (ref 135–145)

## 2019-08-24 LAB — PHOSPHORUS: Phosphorus: 3.6 mg/dL (ref 2.5–4.6)

## 2019-08-24 LAB — MAGNESIUM: Magnesium: 1.8 mg/dL (ref 1.7–2.4)

## 2019-08-24 MED ORDER — CHOLESTYRAMINE 4 G PO PACK
4.0000 g | PACK | Freq: Once | ORAL | Status: AC
  Administered 2019-08-24: 4 g via ORAL
  Filled 2019-08-24: qty 1

## 2019-08-24 NOTE — Plan of Care (Signed)

## 2019-08-24 NOTE — Progress Notes (Signed)
PROGRESS NOTE    Gina [name]  PYP:[id number] DOB: 1960-03-19 DOA: 08/17/2019 PCP: [name], MD   Chief Complaint  Patient presents with  . Fever    Brief Narrative:  Ardella Chhim a 59 y.o.femalewith medical history significant ofperforated prepyloric ulcerin 2020 s/pexlap with [name] patch, chronic TPN via right arm PICC, IDDM, bilateral lower quadrant drains along with enterocutaneous fistula with colostomy bag, Chronic sacral pressure ulcers stage II,HTN, schizophrenia, bilateral BKA, PVD s/p B/L BKA,sent from from home for evaluation of fever. She was admitted few times this year for sepsis, including MRSAbacteremia fungemia in [place] peritonitis in April andProteus UTIin May.Pt on TPN and she also take soft diet to improve her nutrition condition. Daughter noticed that pt has had decreased appetite for last 3-4 day and was warm to touch last night and complained about feeling of nausea but no vomiting. No abd pain. She also seemed to have burning sensation when urinated. Daughter reported that she does wound care everyday herself and did not see significant changes associated with the sacral wounds. Daughter also reported that pt Glucose has been fairly controlled at home seldom >200. ED Course:Cre1.4, CrCL 35 ml/min, Tmax 103.1, WBC WNL, LA 1.8. UA showed WBC 11-20/hpf. Xray no infiltrates. GLU >700    Assessment & Plan:   Principal Problem:   Fever Active Problems:   HTN (hypertension)   Diabetes (HCC)   Anemia of chronic disease   Sepsis ([place])   Schizophrenia ([place])   Acute kidney injury ([place])   Sacral decubitus ulcer   Normocytic anemia   S/P BKA (below knee amputation) bilateral (HCC)   Status post colostomy (HCC)   Enterocutaneous fistula   Urinary tract infection due to Proteus   Iron deficiency anemia  1 sepsis secondary to Candida glabrata fungemia, POA Patient presented with fevers, normal white count, tachycardic, normal  respiratory rate.  Blood cultures from 08/17/2019 with 1/2+ for Candida.  Repeat blood cultures from 08/19/2019 initially with no growth to date however this morning are positive for yeast/Candida.  Patient noted to have been on TPN with a PICC line.  PICC line had been removed for line holiday pending repeat blood cultures.  CT abdomen and pelvis which was done with no significant change in size of fluid collection.  Patient seen by ophthalmology (08/19/2019) and endophthalmitis ruled out.  Continue IV Eraxis day #7/14.  2D echo which was done was negative for endocarditis.  Patient had PICC line placed back in on 08/21/2019 as blood cultures continue to have remained negative.  However repeat blood cultures from 08/19/2019 are positive on day four, with yeast. PICC line has been removed.  Repeat blood cultures ordered per ID.  Once repeat blood cultures from 08/23/2019 and negative for 5 days per ID recommendations, PICC line may be placed back in.  Patient it was felt initially would need a total of 14 days of treatment per ID recommendations however due to persistent fungemia length of treatment may need to be increased.  Due to history of perforated gastric ulcer will likely hold off on requesting TEE..  ID following and I appreciate the input and recommendations.    2.  History of perforated gastric ulcer status post oversewing and Graham patch 01/2019/enterocutaneous fistula CT abdomen and pelvis with persistent fluid collection within ventral aspect of peritoneal cavity with continued fistula to skin surface at level of umbilicus, presumably ongoing enterocutaneous fistula, no significant change in size.  Per general surgical notes from 06/2019, appears developed enterocutaneous  fistula postop from perforated ulcer treated with oversewing and Graham patch.  Wound care following.  Give a dose of Questran secondary to watery loose stools.  Continue PPI.  3.  Diabetes mellitus type 2 Hemoglobin A1c 7.0.  CBG of 144  this morning.  Elevated blood glucose levels likely secondary to TPN.  Patient started on Lantus 5 units daily.  Patient with repeat positive blood cultures and as such PICC line removed on 08/23/2019 and TPN held.  Lantus has been discontinued.  Continue sliding scale insulin.  Continue Neurontin.  Outpatient follow-up with PCP.   4.  Anemia/iron deficiency anemia of chronic illness Patient noted to have a drop in hemoglobin with no signs of overt bleeding felt likely dilutional.  Status post 2 units packed red blood cells hemoglobin currently stable at 9.2.  Patient noted to be having brown stool however Hemoccult was positive.  If patient continues to have further drop in hemoglobin will need to consult with GI for further evaluation.  Status post IV Feraheme.  Continue oral iron supplementation.  Outpatient follow-up. Follow H&H.  5.  Acute kidney injury Resolved with hydration.   6.  Proteus mirabilis UTI Patient on presentation had complaints of dysuria.  Urine cultures with > 100,000 colonies of Proteus mirabilis.  Clinical improvement.  Afebrile.  Status post 2 days IV cefepime and 1 dose of IV Rocephin.  No further antibiotics needed.  Discussed with ID.  7.  Hypertension Continue Norvasc and Lopressor.  Follow.    8.  Stage II pressure ulcer, chronic, POA Patient has been assessed by wound care.  Continue current recommendations per wound care.  Pressure Injury 05/22/19 Coccyx Bilateral Stage 4 - Full thickness tissue loss with exposed bone, tendon or muscle. Per wound nurse, healing stage IV from prior admission (Active)  05/22/19 2104  Location: Coccyx  Location Orientation: Bilateral  Staging: Stage 4 - Full thickness tissue loss with exposed bone, tendon or muscle.  Wound Description (Comments): Per wound nurse, healing stage IV from prior admission  Present on Admission: Yes     Pressure Injury 05/22/19 Knee Right;Distal Unstageable - Full thickness tissue loss in which the  base of the injury is covered by slough (yellow, tan, gray, green or brown) and/or eschar (tan, brown or black) in the wound bed. Stump (Active)  05/22/19 2106  Location: Knee  Location Orientation: Right;Distal  Staging: Unstageable - Full thickness tissue loss in which the base of the injury is covered by slough (yellow, tan, gray, green or brown) and/or eschar (tan, brown or black) in the wound bed.  Wound Description (Comments): Stump  Present on Admission: Yes     Pressure Injury 05/22/19 Knee Left;Distal Unstageable - Full thickness tissue loss in which the base of the injury is covered by slough (yellow, tan, gray, green or brown) and/or eschar (tan, brown or black) in the wound bed. Stump (Active)  05/22/19 2108  Location: Knee  Location Orientation: Left;Distal  Staging: Unstageable - Full thickness tissue loss in which the base of the injury is covered by slough (yellow, tan, gray, green or brown) and/or eschar (tan, brown or black) in the wound bed.  Wound Description (Comments): Stump  Present on Admission: Yes     Pressure Injury 05/22/19 Thigh Left;Mid Stage 2 -  Partial thickness loss of dermis presenting as a shallow open injury with a red, pink wound bed without slough. (Active)  05/22/19 2109  Location: Thigh  Location Orientation: Left;Mid  Staging: Stage 2 -  Partial thickness loss of dermis presenting as a shallow open injury with a red, pink wound bed without slough.  Wound Description (Comments):   Present on Admission: Yes     Pressure Injury 07/22/19 Sacrum Mid Stage 3 -  Full thickness tissue loss. Subcutaneous fat may be visible but bone, tendon or muscle are NOT exposed. (Active)  07/22/19   Location: Sacrum  Location Orientation: Mid  Staging: Stage 3 -  Full thickness tissue loss. Subcutaneous fat may be visible but bone, tendon or muscle are NOT exposed.  Wound Description (Comments):   Present on Admission: Yes     Pressure Injury 07/22/19 Leg Left  Stage 3 -  Full thickness tissue loss. Subcutaneous fat may be visible but bone, tendon or muscle are NOT exposed. (Active)  07/22/19   Location: Leg  Location Orientation: Left  Staging: Stage 3 -  Full thickness tissue loss. Subcutaneous fat may be visible but bone, tendon or muscle are NOT exposed.  Wound Description (Comments):   Present on Admission: Yes     Pressure Injury 07/22/19 Leg Right Stage 3 -  Full thickness tissue loss. Subcutaneous fat may be visible but bone, tendon or muscle are NOT exposed. (Active)  07/22/19   Location: Leg  Location Orientation: Right  Staging: Stage 3 -  Full thickness tissue loss. Subcutaneous fat may be visible but bone, tendon or muscle are NOT exposed.  Wound Description (Comments):   Present on Admission: Yes        DVT prophylaxis: Lovenox on hold due to anemia and positive Hemoccult. Code Status: DNR Family Communication: Updated patient.  No family at bedside. Disposition:   Status is: Inpatient    Dispo: The patient is from: Home              Anticipated d/c is to: Home with home health.              Anticipated d/c date is: Hopefully in 5 to 6 days once repeat blood cultures are negative and when okay with ID.               Patient currently on IV Eraxis, Candida bacteremia, PICC line placed back in 08/21/2019 and TPN started.  Patient now with blood cultures coming back positive from 08/19/2019 and as such PICC line being removed 08/23/2019.  Repeat blood cultures pending.  Not ready for discharge.         Consultants:   Ophthalmology: Dr. Ellie Lunch 08/19/2019  ID: Dr. Megan Salon 08/17/2019  Procedures:   CT abdomen and pelvis 08/18/2019  Chest x-ray 08/17/2019  2D echo 08/18/2019  PICC line removal  2 units packed red blood cell transfusion 08/18/2019  PICC line 08/21/2019  PICC line removal 08/23/2019  Antimicrobials:  IV Eraxis 08/18/2019>>>> 08/31/2019  IV cefepime 08/17/2019>>>> 08/18/2019  IV Rocephin 6/9/2021x1  dose   Subjective: Patient laying in bed.  No chest pain.  No shortness of breath.  States nausea and emesis improved.  Asking when she is going to be able to go home.   Objective: Vitals:   08/23/19 2120 08/24/19 0000 08/24/19 0400 08/24/19 0751  BP: 114/70 105/60 104/63 (!) 108/57  Pulse: 72 73 72 70  Resp:  10 17 15   Temp:  98.4 F (36.9 C) 98.8 F (37.1 C) 98.2 F (36.8 C)  TempSrc:  Oral Oral Oral  SpO2:  92% 92% 96%  Weight:      Height:        Intake/Output Summary (Last  24 hours) at 08/24/2019 0942 Last data filed at 08/23/2019 2000 Gross per 24 hour  Intake 511 ml  Output 400 ml  Net 111 ml   Filed Weights   08/17/19 0900  Weight: 49.9 kg    Examination:  General exam: NAD Respiratory system: CTAB anterior lung fields.  Cardiovascular system: RRR no murmurs rubs or gallops.  No JVD.  Gastrointestinal system: Abdomen is nontender, nondistended, soft, positive bowel sounds.  Ostomy with loose/watery stool noted.  Central nervous system: Alert and oriented. No focal neurological deficits. Extremities: Status post bilateral BKA.  Skin: No rashes, lesions or ulcers Psychiatry: Judgement and insight appear normal. Mood & affect appropriate.     Data Reviewed: I have personally reviewed following labs and imaging studies  CBC: Recent Labs  Lab 08/18/19 0443 08/18/19 0443 08/18/19 2109 08/19/19 0619 08/20/19 0235 08/21/19 0308 08/22/19 0640  WBC 5.0  --   --  6.5 6.2 8.4 8.2  NEUTROABS 3.8  --   --  4.5 3.8 5.2 5.9  HGB 5.6*   < > 10.2* 9.9* 9.8* 9.6* 9.2*  HCT 17.6*   < > 30.5* 30.4* 29.8* 29.7* 28.3*  MCV 90.3  --   --  88.6 87.6 87.6 89.3  PLT 141*  --   --  127* 145* 163 201   < > = values in this interval not displayed.    Basic Metabolic Panel: Recent Labs  Lab 08/18/19 0443 08/18/19 0443 08/19/19 1093 08/20/19 0235 08/21/19 0308 08/22/19 0640 08/23/19 0243  NA 132*   < > 133* 136 134* 130* 134*  K 4.0   < > 3.9 3.5 4.1 4.7 4.4  CL  105   < > 106 110 110 103 105  CO2 22   < > 18* 19* 19* 20* 21*  GLUCOSE 215*   < > 94 104* 73 447* 234*  BUN 63*   < > 43* 36* 29* 40* 41*  CREATININE 1.22*   < > 0.93 0.94 1.08* 0.94 0.74  CALCIUM 8.0*   < > 8.9 8.9 8.8* 8.4* 8.7*  MG 1.8   < > 1.7 1.8 2.6* 2.1 2.0  PHOS 3.4  --  3.3 3.3  --  3.6 3.4   < > = values in this interval not displayed.    GFR: Estimated Creatinine Clearance: 60.4 mL/min (by C-G formula based on SCr of 0.74 mg/dL).  Liver Function Tests: Recent Labs  Lab 08/17/19 0950 08/18/19 0443 08/19/19 0619 08/20/19 0235 08/22/19 0640  AST 20 39 30 22 55*  ALT 20 30 24 21  41  ALKPHOS 72 51 58 75 266*  BILITOT 0.6 0.6 0.7 0.7 1.7*  PROT 7.8 5.6* 6.2* 6.3* 6.4*  ALBUMIN 1.9* 1.5* 2.0* 1.8* 1.8*    CBG: Recent Labs  Lab 08/23/19 1559 08/23/19 2013 08/23/19 2355 08/24/19 0413 08/24/19 0752  GLUCAP 154* 156* 121* 144* 123*     Recent Results (from the past 240 hour(s))  SARS Coronavirus 2 by RT PCR (hospital order, performed in [facility] hospital lab) Nasopharyngeal Nasopharyngeal Swab     Status: None   Collection Time: 08/17/19  8:55 AM   Specimen: Nasopharyngeal Swab  Result Value Ref Range Status   SARS Coronavirus 2 NEGATIVE NEGATIVE Final    Comment: (NOTE) SARS-CoV-2 target nucleic acids are NOT DETECTED. The SARS-CoV-2 RNA is generally detectable in upper and lower respiratory specimens during the acute phase of infection. The lowest concentration of SARS-CoV-2 viral copies this assay can detect  is 250 copies / mL. A negative result does not preclude SARS-CoV-2 infection and should not be used as the sole basis for treatment or other patient management decisions.  A negative result may occur with improper specimen collection / handling, submission of specimen other than nasopharyngeal swab, presence of viral mutation(s) within the areas targeted by this assay, and inadequate number of viral copies (<250 copies / mL). A negative result  must be combined with clinical observations, patient history, and epidemiological information. Fact Sheet for Patients:   StrictlyIdeas.no Fact Sheet for Healthcare Providers: BankingDealers.co.za This test is not yet approved or cleared  by the [place] FDA and has been authorized for detection and/or diagnosis of SARS-CoV-2 by FDA under an Emergency Use Authorization (EUA).  This EUA will remain in effect (meaning this test can be used) for the duration of the COVID-19 declaration under Section 564(b)(1) of the Act, 21 U.S.C. section 360bbb-3(b)(1), unless the authorization is terminated or revoked sooner. Performed at [facility], [place] [address], [place], [place] [address]   Urine culture     Status: Abnormal   Collection Time: 08/17/19  9:05 AM   Specimen: In/Out Cath Urine  Result Value Ref Range Status   Specimen Description IN/OUT CATH URINE  Final   Special Requests   Final    NONE Performed at [facility], [place] [address], [place], [place] [address]    Culture >=100,000 COLONIES/mL PROTEUS MIRABILIS (A)  Final   Report Status 08/20/2019 FINAL  Final   Organism ID, Bacteria PROTEUS MIRABILIS (A)  Final      Susceptibility   Proteus mirabilis - MIC*    AMPICILLIN <=2 SENSITIVE Sensitive     CEFAZOLIN <=4 SENSITIVE Sensitive     CEFTRIAXONE <=1 SENSITIVE Sensitive     CIPROFLOXACIN >=4 RESISTANT Resistant     GENTAMICIN <=1 SENSITIVE Sensitive     IMIPENEM 2 SENSITIVE Sensitive     NITROFURANTOIN 128 RESISTANT Resistant     TRIMETH/SULFA >=320 RESISTANT Resistant     AMPICILLIN/SULBACTAM <=2 SENSITIVE Sensitive     PIP/TAZO <=4 SENSITIVE Sensitive     * >=100,000 COLONIES/mL PROTEUS MIRABILIS  Blood Culture (routine x 2)     Status: Abnormal   Collection Time: 08/17/19  9:50 AM   Specimen: BLOOD LEFT ARM  Result Value Ref Range Status   Specimen Description BLOOD LEFT ARM  Final   Special Requests    Final    BOTTLES DRAWN AEROBIC AND ANAEROBIC Blood Culture adequate volume   Culture  Setup Time   Final    BUDDING YEAST SEEN IN BOTH AEROBIC AND ANAEROBIC BOTTLES CRITICAL RESULT CALLED TO, READ BACK BY AND VERIFIED WITH: Park Breed [id number] FCP Performed at [facility], [address], [place], [place] [address]    Culture CANDIDA GLABRATA (A)  Final   Report Status 08/21/2019 FINAL  Final  Blood Culture ID Panel (Reflexed)     Status: Abnormal   Collection Time: 08/17/19  9:50 AM  Result Value Ref Range Status   Enterococcus species NOT DETECTED NOT DETECTED Final   Listeria monocytogenes NOT DETECTED NOT DETECTED Final   Staphylococcus species NOT DETECTED NOT DETECTED Final   Staphylococcus aureus (BCID) NOT DETECTED NOT DETECTED Final   Streptococcus species NOT DETECTED NOT DETECTED Final   Streptococcus agalactiae NOT DETECTED NOT DETECTED Final   Streptococcus pneumoniae NOT DETECTED NOT DETECTED Final   Streptococcus pyogenes NOT DETECTED NOT DETECTED Final  Acinetobacter baumannii NOT DETECTED NOT DETECTED Final   Enterobacteriaceae species NOT DETECTED NOT DETECTED Final   Enterobacter cloacae complex NOT DETECTED NOT DETECTED Final   Escherichia coli NOT DETECTED NOT DETECTED Final   Klebsiella oxytoca NOT DETECTED NOT DETECTED Final   Klebsiella pneumoniae NOT DETECTED NOT DETECTED Final   Proteus species NOT DETECTED NOT DETECTED Final   Serratia marcescens NOT DETECTED NOT DETECTED Final   Haemophilus influenzae NOT DETECTED NOT DETECTED Final   Neisseria meningitidis NOT DETECTED NOT DETECTED Final   Pseudomonas aeruginosa NOT DETECTED NOT DETECTED Final   Candida albicans NOT DETECTED NOT DETECTED Final   Candida glabrata DETECTED (A) NOT DETECTED Final    Comment: CRITICAL RESULT CALLED TO, READ BACK BY AND VERIFIED WITH: PHARMD JEREMY F. [id number] FCP    Candida krusei NOT DETECTED NOT DETECTED Final   Candida parapsilosis NOT DETECTED NOT  DETECTED Final   Candida tropicalis NOT DETECTED NOT DETECTED Final    Comment: Performed at [facility], [place] [address], [place], [place] [address]  Blood Culture (routine x 2)     Status: Abnormal   Collection Time: 08/17/19  1:12 PM   Specimen: BLOOD LEFT ARM  Result Value Ref Range Status   Specimen Description BLOOD LEFT ARM  Final   Special Requests   Final    BOTTLES DRAWN AEROBIC ONLY Blood Culture adequate volume   Culture  Setup Time   Final    AEROBIC BOTTLE ONLY YEAST CRITICAL VALUE NOTED.  VALUE IS CONSISTENT WITH PREVIOUSLY REPORTED AND CALLED VALUE. Performed at [facility], [place] [address], [place], [place] [address]    Culture CANDIDA GLABRATA (A)  Final   Report Status 08/21/2019 FINAL  Final  MRSA PCR Screening     Status: Abnormal   Collection Time: 08/17/19  8:23 PM   Specimen: Nasal Mucosa; Nasopharyngeal  Result Value Ref Range Status   MRSA by PCR POSITIVE (A) NEGATIVE Final    Comment:        The GeneXpert MRSA Assay (FDA approved for NASAL specimens only), is one component of a comprehensive MRSA colonization surveillance program. It is not intended to diagnose MRSA infection nor to guide or monitor treatment for MRSA infections. RESULT CALLED TO, READ BACK BY AND VERIFIED WITH: J. MILLER,CHARGE RN 0623 08/17/2019 Mena Goes Performed at [facility], [place] [address] [place], [place] [address]   Culture, blood (routine x 2)     Status: None (Preliminary result)   Collection Time: 08/19/19  5:56 AM   Specimen: BLOOD LEFT HAND  Result Value Ref Range Status   Specimen Description BLOOD LEFT HAND  Final   Special Requests   Final    BOTTLES DRAWN AEROBIC AND ANAEROBIC Blood Culture adequate volume   Culture  Setup Time   Final    ANAEROBIC BOTTLE ONLY YEAST CRITICAL VALUE NOTED.  VALUE IS CONSISTENT WITH PREVIOUSLY REPORTED AND CALLED VALUE.    Culture   Final    CULTURE REINCUBATED FOR BETTER GROWTH CORRECTED ON 06/13 AT 0736:  PREVIOUSLY REPORTED AS NO GROWTH 5 DAYS Performed at [facility], [place] [address], [place], [place] [address]    Report Status PENDING  Incomplete  Culture, blood (routine x 2)     Status: Abnormal (Preliminary result)   Collection Time: 08/19/19  5:56 AM   Specimen: BLOOD LEFT HAND  Result Value Ref Range Status   Specimen Description BLOOD LEFT HAND  Final  Special Requests   Final    BOTTLES DRAWN AEROBIC AND ANAEROBIC Blood Culture adequate volume   Culture  Setup Time (A)  Final    YEAST AEROBIC BOTTLE ONLY CRITICAL RESULT CALLED TO, READ BACK BY AND VERIFIED WITH: PHARMD Y PATEL [id number] AT 21 AM BY CM    Culture   Final    CULTURE REINCUBATED FOR BETTER GROWTH Performed at [facility], [place] [address] [place], [place] [address]    Report Status PENDING  Incomplete  Culture, blood (routine x 2)     Status: None (Preliminary result)   Collection Time: 08/23/19 11:17 AM   Specimen: BLOOD LEFT FOREARM  Result Value Ref Range Status   Specimen Description BLOOD LEFT FOREARM  Final   Special Requests   Final    BOTTLES DRAWN AEROBIC ONLY Blood Culture adequate volume   Culture   Final    NO GROWTH < 24 HOURS Performed at [facility], [place] [address], [place], [place] [address]    Report Status PENDING  Incomplete  Culture, blood (routine x 2)     Status: None (Preliminary result)   Collection Time: 08/23/19 11:22 AM   Specimen: BLOOD LEFT FOREARM  Result Value Ref Range Status   Specimen Description BLOOD LEFT FOREARM  Final   Special Requests   Final    BOTTLES DRAWN AEROBIC ONLY Blood Culture adequate volume   Culture   Final    NO GROWTH < 24 HOURS Performed at [facility], [place] [address] [place], [place] [address]    Report Status PENDING  Incomplete         Radiology Studies: No results found.      Scheduled Meds: . amLODipine  5 mg Oral Daily  . ascorbic acid  500 mg Oral BID  . Chlorhexidine Gluconate Cloth  6 each  Topical Daily  . escitalopram  20 mg Oral Daily  . feeding supplement  1 Container Oral TID BM  . ferrous sulfate  300 mg Oral Q breakfast  . gabapentin  300 mg Oral TID  . insulin aspart  0-9 Units Subcutaneous TID WC  . melatonin  3 mg Oral QHS  . metoprolol tartrate  50 mg Oral BID  . pantoprazole  40 mg Oral BID  . sodium chloride flush  10-40 mL Intracatheter Q12H   Continuous Infusions: . anidulafungin 100 mg (08/23/19 1153)     LOS: 7 days    Time spent: 35 minutes    Irine Seal, MD [facility]   To contact the attending provider between 7A-7P or the covering provider during after hours 7P-7A, please log into the web site [email or web address] and access using universal [place] password for that web site. If you do not have the password, please call the hospital operator.  08/24/2019, 9:42 AM

## 2019-08-24 NOTE — Progress Notes (Signed)
[place] pouch Kellie Simmering # [phone]) not available at this hour for pouch change. Will forward the information to the day shift nurse.

## 2019-08-24 NOTE — Progress Notes (Signed)
.  PHARMACY - TOTAL PARENTERAL NUTRITION CONSULT NOTE   Indication: Fistula  Patient Measurements: Height: _0  (157.5 cm) Weight: 49.9 kg (110 lb) IBW/kg (Calculated) : 50.1 TPN AdjBW (KG): 49.9 Body mass index is 20.12 kg/m. Usual Weight: 53-60 kg  Assessment: 59 yo female presents on 08/17/2019 with fever, dysuria, and decreased intake. On presentation, patient found to have blood glucose of > 600 and started on insulin gtt. Prior to admission patient is on chronic cyclic TPN via R arm PICC and soft diet. TPN was initiated in 02/2019 after perforated prepyloric ulcer which was repaired with a [name] patch and continued to leak. Patient currently has an Chief Strategy Officer in place from January 2021 due to [facility] fistula.   Per patient, decreased appetite and po intake for previous 3-4 days prior to presentation. She reports transitioning from a normal diet of foods such as grits, chicken, tuna, chicken noodle soup, chips, jello, and pudding, to full liquid diet of mainly soup. Per outpatient records, she is on a 14-hour TPN cycle.   TPN was held on admission until 6/10 due to candidemia which is being treated with anidulafungin. PICC line was pulled and is being reinserted 6/10. Pharmacy consulted to resume TPN on 08/21/19.  Glucose / Insulin: Hx of DM (controlled by diet outpt). Hyperglycemia on admission, started on insulin gtt, then stable for days. CBGs improved but remain uncontrolled back on continuous TPN. Now Lantus d/c'd 6/12 with TPN held again and CBGs controlled off TPN. Utilized 8 units sensitive SSI in last 24 hrs  Electrolytes: Na 134. Corrected Ca 10.4. CO2 21. Others wnl Renal: Scr down to 0.74, baseline Scr 0.6-0.8 LFTs / TGs: Alk phos 266 - increase. AST 55 - increase. ALT wnl. Tbili 1.7 - increase. TG WNL Prealbumin / albumin: prealbumin 8.9, albumin 1.8. Intake / Output; MIVF: LBM 6/12 GI Imaging: 6/7 CT abd - ongoing enterocutaneous fistula, no significant change in size of fluid  collection despite removng perc drainage catheter Surgeries / Procedures:   Central access: PICC 6/10 TPN start date: Chronic TPN prior to admission since 02/2019; TPN resumed on 6/10  Nutritional Goals (per outpatient records): kCal: 1471 kcal, Protein: 93 g, Fluid: 1800 ml Goal TPN rate is 75 mL/hr (provides 90g of protein and 1476 kcals per day)  RD goals per note 6/7: KCal: 1500-1700, Protein: 65-80 g, Fluid: >/=1.5L  Current Nutrition:  Boost Breeze PO TID Resumed home 56-CL cyclic TPN on 2/75, back to continuous 24-hr cycle 6/11; now TPN held again 6/12  Plan:  Continue to hold TPN for now due to PICC line removal 6/12 for persistently positive BCx Continue sSSI and adjust as needed. Lantus d/c'd 6/12 with TPN held Per Dr. Grandville Silos, will monitor but hold off on hanging D10 or D5 for now   Arturo Morton, PharmD, BCPS Please check AMION for all [facility] contact numbers Clinical Pharmacist 08/24/2019 7:58 AM

## 2019-08-25 DIAGNOSIS — B379 Candidiasis, unspecified: Secondary | ICD-10-CM

## 2019-08-25 DIAGNOSIS — B377 Candidal sepsis: Secondary | ICD-10-CM | POA: Diagnosis present

## 2019-08-25 LAB — GLUCOSE, CAPILLARY
Glucose-Capillary: 135 mg/dL — ABNORMAL HIGH (ref 70–99)
Glucose-Capillary: 147 mg/dL — ABNORMAL HIGH (ref 70–99)
Glucose-Capillary: 163 mg/dL — ABNORMAL HIGH (ref 70–99)
Glucose-Capillary: 176 mg/dL — ABNORMAL HIGH (ref 70–99)
Glucose-Capillary: 185 mg/dL — ABNORMAL HIGH (ref 70–99)

## 2019-08-25 LAB — CBC
HCT: 25.7 % — ABNORMAL LOW (ref 36.0–46.0)
Hemoglobin: 8 g/dL — ABNORMAL LOW (ref 12.0–15.0)
MCH: 28.7 pg (ref 26.0–34.0)
MCHC: 31.1 g/dL (ref 30.0–36.0)
MCV: 92.1 fL (ref 80.0–100.0)
Platelets: 311 10*3/uL (ref 150–400)
RBC: 2.79 MIL/uL — ABNORMAL LOW (ref 3.87–5.11)
RDW: 16.2 % — ABNORMAL HIGH (ref 11.5–15.5)
WBC: 7.4 10*3/uL (ref 4.0–10.5)
nRBC: 0 % (ref 0.0–0.2)

## 2019-08-25 LAB — DIFFERENTIAL
Abs Immature Granulocytes: 0 10*3/uL (ref 0.00–0.07)
Basophils Absolute: 0 10*3/uL (ref 0.0–0.1)
Basophils Relative: 0 %
Eosinophils Absolute: 0 10*3/uL (ref 0.0–0.5)
Eosinophils Relative: 0 %
Lymphocytes Relative: 17 %
Lymphs Abs: 1.3 10*3/uL (ref 0.7–4.0)
Monocytes Absolute: 0.5 10*3/uL (ref 0.1–1.0)
Monocytes Relative: 7 %
Neutro Abs: 5.6 10*3/uL (ref 1.7–7.7)
Neutrophils Relative %: 76 %
nRBC: 0 /100 WBC

## 2019-08-25 LAB — COMPREHENSIVE METABOLIC PANEL
ALT: 22 U/L (ref 0–44)
AST: 11 U/L — ABNORMAL LOW (ref 15–41)
Albumin: 1.7 g/dL — ABNORMAL LOW (ref 3.5–5.0)
Alkaline Phosphatase: 187 U/L — ABNORMAL HIGH (ref 38–126)
Anion gap: 8 (ref 5–15)
BUN: 38 mg/dL — ABNORMAL HIGH (ref 6–20)
CO2: 21 mmol/L — ABNORMAL LOW (ref 22–32)
Calcium: 8.7 mg/dL — ABNORMAL LOW (ref 8.9–10.3)
Chloride: 105 mmol/L (ref 98–111)
Creatinine, Ser: 0.85 mg/dL (ref 0.44–1.00)
GFR calc Af Amer: >60 mL/min (ref >60)
GFR calc non Af Amer: >60 mL/min (ref >60)
Glucose, Bld: 196 mg/dL — ABNORMAL HIGH (ref 70–99)
Potassium: 3.9 mmol/L (ref 3.5–5.1)
Sodium: 134 mmol/L — ABNORMAL LOW (ref 135–145)
Total Bilirubin: 0.7 mg/dL (ref 0.3–1.2)
Total Protein: 6.6 g/dL (ref 6.5–8.1)

## 2019-08-25 LAB — TRIGLYCERIDES: Triglycerides: 252 mg/dL — ABNORMAL HIGH (ref <150)

## 2019-08-25 LAB — MAGNESIUM: Magnesium: 1.7 mg/dL (ref 1.7–2.4)

## 2019-08-25 LAB — PHOSPHORUS: Phosphorus: 3.6 mg/dL (ref 2.5–4.6)

## 2019-08-25 LAB — PREALBUMIN: Prealbumin: 9.4 mg/dL — ABNORMAL LOW (ref 18–38)

## 2019-08-25 MED ORDER — MAGNESIUM SULFATE 4 GM/100ML IV SOLN
4.0000 g | Freq: Once | INTRAVENOUS | Status: AC
  Administered 2019-08-25: 4 g via INTRAVENOUS
  Filled 2019-08-25: qty 100

## 2019-08-25 MED ORDER — SODIUM CHLORIDE 0.9 % IV SOLN: 4.0000 | Freq: Once | INTRAVENOUS | Status: DC

## 2019-08-25 NOTE — Consult Note (Signed)
Referring Provider: Dr. Irine Seal Primary Care Physician:  [name], MD Primary Gastroenterologist:  Althia Forts  Reason for Consultation:  Iron-deficiency anemia, heme-positive stools  HPI: Gina [name] is a 59 y.o. female with past medical history of a perforated pyloric channel ulcer (s/p exlap with [name] patch in 16/109) complicated by a chronic enterocutaneous fistula with bag, on chronic TPN, PVD (s/p bilateral BKA), several episodes of sepsis (MRSA, candida, proteus) currently hospitalized for candida glabrata fungemia presenting for consultation of iron-deficiency anemia.  Patient states she feels well and is ready to go home.  She denies abdominal pain, heartburn, dysphagia, nausea, vomiting, decreased appetite, early satiety, and unexplained weight loss.  Denies fatigue, shortness of breath, and chest pain. Does not recall seeing any melena or hematochezia.  She states she has bowel movements (per rectum) every few days.  Per RN, a small amount of bright red blood has been noted at the tissue at the end of bag, especially after changing the bag, as well as in the ileostomy bag.  However, she has not seen any black stools.  Stools are mostly brown or green.  Patient denies NSAID or anticoagulation use.  Most recent EGD 07/25/19 showed acute gastritis and previous closure of a perforated duodenal ulcer characterized by congestion and erythema.  Past Medical History:  Diagnosis Date  . Diabetes mellitus   . Hypertension   . Osteomyelitis of right foot ([place]) 02/22/2017  . Perforated gastric ulcer ([place])   . S/P bilateral BKA (below knee amputation) ([place])   . Schizophrenia ([place])   . Schizophrenia ([place])   . Septic arthritis of interphalangeal joint of toe of left foot ([place]) 06/02/2016  . Stress incontinence 04/26/2017    Past Surgical History:  Procedure Laterality Date  . AMPUTATION Left 06/05/2016   Procedure: LEFT FOOT TRANSMETATARSAL AMPUTATION;  Surgeon: [name], MD;  Location: [facility];  Service: Orthopedics;  Laterality: Left;  . AMPUTATION Right 11/11/2017   Procedure: AMPUTATION BELOW KNEE;  Surgeon: [name], MD;  Location: [place];  Service: Orthopedics;  Laterality: Right;  . AMPUTATION Left 05/10/2018   Procedure: LEFT BELOW KNEE AMPUTATION;  Surgeon: [name], MD;  Location: [place];  Service: Orthopedics;  Laterality: Left;  . AMPUTATION Left 12/28/2018   Procedure: AMPUTATION THIRD DIGIT;  Surgeon: Iran Planas, MD;  Location: [facility];  Service: Orthopedics;  Laterality: Left;  . AMPUTATION TOE Right 02/23/2017   Procedure: AMPUTATION RIGHT THIRD TOE;  Surgeon: [name], MD;  Location: [place];  Service: Orthopedics;  Laterality: Right;  . ESOPHAGOGASTRODUODENOSCOPY (EGD) WITH PROPOFOL N/A 07/24/2019   Procedure: ESOPHAGOGASTRODUODENOSCOPY (EGD) WITH PROPOFOL;  Surgeon: Wilford Corner, MD;  [facility];  Service: Endoscopy;  Laterality: N/A;  . I & D EXTREMITY Right 11/09/2017   Procedure: Debride Ulcer Right Heel;  Surgeon: [name], MD;  Location: [place];  Service: Orthopedics;  Laterality: Right;  . I & D EXTREMITY Left 12/28/2018   Procedure: IRRIGATION AND DEBRIDEMENT EXTREMITY;  Surgeon: Iran Planas, MD;  Location: [facility];  Service: Orthopedics;  Laterality: Left;  . INCISION AND DRAINAGE OF WOUND Left 12/26/2018   Procedure: IRRIGATION AND DEBRIDEMENT LEFT HAND;  Surgeon: Iran Planas, MD;  Location: [facility];  Service: Orthopedics;  Laterality: Left;  . LAPAROTOMY N/A 02/02/2019   Procedure: EXPLORATORY LAPAROTOMY WITH OVERSEW OF DUODENAL ULCER;  Surgeon: Alphonsa Overall, MD;  Location: [facility];  Service: General;  Laterality: N/A;  . TEE WITHOUT CARDIOVERSION N/A 05/27/2019  Procedure: TRANSESOPHAGEAL ECHOCARDIOGRAM (TEE);  Surgeon: Skeet Latch, MD;  Location: [place];  Service: Cardiovascular;  Laterality: N/A;  . TUBAL LIGATION      Prior to Admission medications   Medication Sig Start  Date End Date Taking? Authorizing Provider  amLODipine (NORVASC) 5 MG tablet Take 5 mg by mouth daily.   Yes [provider]  ascorbic acid (VITAMIN C) 500 MG tablet Take 1 tablet (500 mg total) by mouth 2 (two) times daily. 06/10/19  Yes Shelly Coss, MD  escitalopram (LEXAPRO) 20 MG tablet Take 20 mg by mouth daily.   Yes [provider]  gabapentin (NEURONTIN) 300 MG capsule Take 1 capsule (300 mg total) by mouth 2 (two) times daily. 06/10/19  Yes Shelly Coss, MD  loperamide (IMODIUM) 2 MG capsule Take 1 capsule (2 mg total) by mouth every 8 (eight) hours as needed for diarrhea or loose stools. 07/07/19  Yes Regalado, Belkys A, MD  melatonin 3 MG TABS tablet Take 1 tablet (3 mg total) by mouth at bedtime. 06/10/19  Yes Shelly Coss, MD  metoprolol tartrate (LOPRESSOR) 50 MG tablet Take 1 tablet (50 mg total) by mouth 2 (two) times daily. 06/10/19  Yes Shelly Coss, MD  Nutritional Supplements (ENSURE ENLIVE PO) Take 237 mLs by mouth 2 (two) times daily with a meal.   Yes [provider]  ondansetron (ZOFRAN) 4 MG tablet Take 1 tablet (4 mg total) by mouth every 6 (six) hours as needed for nausea. 06/10/19  Yes Shelly Coss, MD  oxyCODONE (OXY IR/ROXICODONE) 5 MG immediate release tablet Take 1 tablet (5 mg total) by mouth every 6 (six) hours as needed (or pain). 06/10/19  Yes Shelly Coss, MD  pantoprazole (PROTONIX) 40 MG tablet Take 1 tablet (40 mg total) by mouth daily. 06/10/19  Yes Shelly Coss, MD  simethicone (MYLICON) 80 MG chewable tablet Chew 80 mg by mouth 4 (four) times daily as needed (indigestion).    Yes [provider]  sodium chloride (OCEAN) 0.65 % SOLN nasal spray Place 1 spray into both nostrils every 4 (four) hours as needed (dry nasal membranes).   Yes [provider]  ferrous sulfate 300 (60 Fe) MG/5ML syrup Take 5 mLs (300 mg total) by mouth daily with breakfast. 07/07/19   Regalado, Belkys A, MD  Magnesium 400 MG  CAPS Take 400 mg by mouth 2 (two) times daily. 07/07/19   Regalado, Belkys A, MD    Scheduled Meds: . amLODipine  5 mg Oral Daily  . ascorbic acid  500 mg Oral BID  . Chlorhexidine Gluconate Cloth  6 each Topical Daily  . escitalopram  20 mg Oral Daily  . feeding supplement  1 Container Oral TID BM  . ferrous sulfate  300 mg Oral Q breakfast  . gabapentin  300 mg Oral TID  . insulin aspart  0-9 Units Subcutaneous TID WC  . melatonin  3 mg Oral QHS  . metoprolol tartrate  50 mg Oral BID  . pantoprazole  40 mg Oral BID  . sodium chloride flush  10-40 mL Intracatheter Q12H   Continuous Infusions: . anidulafungin 78 mL/hr at 08/25/19 0015  . magnesium sulfate bolus IVPB     PRN Meds:.acetaminophen, dextrose, loperamide, metoprolol tartrate, ondansetron, oxyCODONE, prochlorperazine, simethicone, sodium chloride, sodium chloride flush  Allergies as of 08/17/2019 - Review Complete 08/17/2019  Allergen Reaction Noted  . Bee venom Swelling 12/26/2018  . Metformin and related Other (See Comments) 06/18/2013    Family History  Problem  Relation Age of Onset  . Diabetes type II Father   . CAD Father   . Prostate cancer Father   . Diabetes Mellitus II Brother     Social History   Socioeconomic History  . Marital status: Single    Spouse name: Not on file  . Number of children: Not on file  . Years of education: Not on file  . Highest education level: Not on file  Occupational History  . Not on file  Tobacco Use  . Smoking status: Never Smoker  . Smokeless tobacco: Never Used  Vaping Use  . Vaping Use: Never used  Substance and Sexual Activity  . Alcohol use: No    Comment: occasionally  . Drug use: No  . Sexual activity: Not Currently  Other Topics Concern  . Not on file  Social History Narrative  . Not on file   Social Determinants of Health   Financial Resource Strain:   . Difficulty of Paying Living Expenses:   Food Insecurity:   . Worried About Sales executive in the Last Year:   . Arboriculturist in the Last Year:   Transportation Needs:   . Film/video editor (Medical):   [name] Lack of Transportation (Non-Medical):   Physical Activity:   . Days of Exercise per Week:   . Minutes of Exercise per Session:   Stress:   . Feeling of Stress :   Social Connections:   . Frequency of Communication with Friends and Family:   . Frequency of Social Gatherings with Friends and Family:   . Attends Religious Services:   . Active Member of Clubs or Organizations:   . Attends Archivist Meetings:   [name] Marital Status:   Intimate Partner Violence:   . Fear of Current or Ex-Partner:   . Emotionally Abused:   [name] Physically Abused:   . Sexually Abused:     Review of Systems: Review of Systems  Constitutional: Negative for chills, fever, malaise/fatigue and weight loss.  HENT: Negative for hearing loss and tinnitus.   Eyes: Negative for pain and redness.  Respiratory: Negative for cough and shortness of breath.   Cardiovascular: Negative for chest pain and palpitations.  Gastrointestinal: Positive for blood in stool. Negative for abdominal pain, constipation, diarrhea, heartburn, melena, nausea and vomiting.  Genitourinary: Negative for flank pain and hematuria.  Musculoskeletal: Negative for back pain and neck pain.  Skin: Negative for itching and rash.  Neurological: Negative for seizures and loss of consciousness.  Endo/Heme/Allergies: Negative for polydipsia. Does not bruise/bleed easily.  Psychiatric/Behavioral: Negative for substance abuse. The patient is not nervous/anxious.      Physical Exam: Vital signs: Vitals:   08/25/19 0743 08/25/19 1105  BP:  (!) 128/59  Pulse:  63  Resp:  13  Temp: 98 F (36.7 C) (!) 97 F (36.1 C)  SpO2:  95%   Last BM Date: 08/24/19  Physical Exam Constitutional:      General: She is not in acute distress.    Appearance: Normal appearance.  HENT:     Head: Normocephalic and atraumatic.   Eyes:     General: No scleral icterus.    Extraocular Movements: Extraocular movements intact.     Comments: Mild conjunctival pallor  Cardiovascular:     Rate and Rhythm: Normal rate and regular rhythm.     Pulses: Normal pulses.     Heart sounds: Normal heart sounds.  Pulmonary:     Effort: Pulmonary  effort is normal. No respiratory distress.     Breath sounds: Normal breath sounds.  Abdominal:     General: Bowel sounds are normal. There is no distension.     Palpations: Abdomen is soft. There is no mass.     Tenderness: There is no abdominal tenderness. There is no guarding or rebound.     Comments: Ileostomy bag present  Musculoskeletal:     Cervical back: Normal range of motion and neck supple.     Right lower leg: No edema.     Left lower leg: No edema.     Comments: Bilateral below the knee amputation  Skin:    General: Skin is warm and dry.  Neurological:     General: No focal deficit present.     Mental Status: She is alert and oriented to person, place, and time.     Gait: Abnormal gait:    Psychiatric:        Mood and Affect: Mood normal.        Behavior: Behavior normal.     GI:  Lab Results: Recent Labs    08/24/19 1744 08/25/19 0605  WBC 8.0 7.4  HGB 8.7* 8.0*  HCT 28.1* 25.7*  PLT 279 311   BMET Recent Labs    08/23/19 0243 08/24/19 1744 08/25/19 0605  NA 134* 135 134*  K 4.4 4.5 3.9  CL 105 108 105  CO2 21* 19* 21*  GLUCOSE 234* 152* 196*  BUN 41* 40* 38*  CREATININE 0.74 0.80 0.85  CALCIUM 8.7* 8.8* 8.7*   LFT Recent Labs    08/25/19 0605  PROT 6.6  ALBUMIN 1.7*  AST 11*  ALT 22  ALKPHOS 187*  BILITOT 0.7   PT/INR No results for input(s): LABPROT, INR in the last 72 hours.   Studies/Results: No results found.  Impression: Iron-deficiency anemia, heme-positive stools -Hgb persistently decreasing, now at 8.0, decreased from 8.7 yesterday (Hgb 5.6 on 6/7, s/p 2u pRBCs on 6/7) -Elevated BUN:Cr; however, patient has CKD  and has persistent elevations in BUN per chart review -FOBT positive on 6/7  -Iron decreased to 10 as of 6/7 but ferritin is elevated -history of perforated ulcer with enterocutaneous fistula (as per prior CT scan, the nidus is in the small bowel) -small amounts of bright red blood at end of bag, mostly after changing of bag, possibly granulation tissue/tissue irritation  Plan: -Since patient had an EGD 07/25/19, the yield of a repeat EGD at this time is low, especially in the absence of melenic stools.  Furthermore, patient's BUN is trending down. -If further drop in Hemoglobin, recommend bleeding scan -Would benefit from colonoscopy in the future, though patient declines colonoscopy at the present time, as she states she does not want to take the prep, and prep may not reach the colon, as most of the bowel secretions seem to come out from the enterocutaneous fistula into the bag.  [facility] will follow.   LOS: 8 days   [name]  PA-C 08/25/2019, 11:37 AM  Contact #  [phone]

## 2019-08-25 NOTE — Progress Notes (Signed)
PROGRESS NOTE    Gina [name]  WCH:[id number] DOB: 12-20-1960 DOA: 08/17/2019 PCP: [name], MD   Chief Complaint  Patient presents with  . Fever    Brief Narrative:  Gina [name] a 59 y.o.femalewith medical history significant ofperforated prepyloric ulcerin 2020 s/pexlap with [name] patch, chronic TPN via right arm PICC, IDDM, bilateral lower quadrant drains along with enterocutaneous fistula with colostomy bag, Chronic sacral pressure ulcers stage II,HTN, schizophrenia, bilateral BKA, PVD s/p B/L BKA,sent from from home for evaluation of fever. She was admitted few times this year for sepsis, including MRSAbacteremia fungemia in [place] peritonitis in April andProteus UTIin May.Pt on TPN and she also take soft diet to improve her nutrition condition. Daughter noticed that pt has had decreased appetite for last 3-4 day and was warm to touch last night and complained about feeling of nausea but no vomiting. No abd pain. She also seemed to have burning sensation when urinated. Daughter reported that she does wound care everyday herself and did not see significant changes associated with the sacral wounds. Daughter also reported that pt Glucose has been fairly controlled at home seldom >200. ED Course:Cre1.4, CrCL 35 ml/min, Tmax 103.1, WBC WNL, LA 1.8. UA showed WBC 11-20/hpf. Xray no infiltrates. GLU >700    Assessment & Plan:   Principal Problem:   Candidemia (F[place]) Active Problems:   HTN (hypertension)   Diabetes (Gina [name])   Anemia of chronic disease   Schizophrenia (Gina [name])   Acute kidney injury (Gina [name])   Sacral decubitus ulcer   Normocytic anemia   S/P BKA (below knee amputation) bilateral (Gina [name])   Status post colostomy (Gina [name])   Enterocutaneous fistula   Urinary tract infection due to Proteus   Iron deficiency anemia  1 sepsis secondary to Candida glabrata fungemia, POA Patient presented with fevers, normal white count, tachycardic, normal  respiratory rate.  Blood cultures from 08/17/2019 with 1/2+ for Candida.  Repeat blood cultures from 08/19/2019 initially with no growth to date however this morning are positive for yeast/Candida.  Patient noted to have been on TPN with a PICC line.  PICC line had been removed for line holiday pending repeat blood cultures.  CT abdomen and pelvis which was done with no significant change in size of fluid collection.  Patient seen by ophthalmology (08/19/2019) and endophthalmitis ruled out.  Continue IV Eraxis day #8/14.  2D echo which was done was negative for endocarditis.  Patient had PICC line placed back in on 08/21/2019 as blood cultures were negative at that time.  However repeat blood cultures from 08/19/2019 are positive on day 4, with yeast. PICC line has been removed.  Repeat blood cultures ordered per ID.  Once repeat blood cultures from 08/23/2019 are negative for 5 days per ID recommendations, PICC line may be placed back in.  Patient it was felt initially would need a total of 14 days of treatment per ID recommendations however due to persistent fungemia length of treatment may need to be increased.  Due to history of perforated gastric ulcer will likely hold off on requesting TEE..  ID following and I appreciate the input and recommendations.    2.  History of perforated gastric ulcer status post oversewing and Graham patch 01/2019/enterocutaneous fistula CT abdomen and pelvis with persistent fluid collection within ventral aspect of peritoneal cavity with continued fistula to skin surface at level of umbilicus, presumably ongoing enterocutaneous fistula, no significant change in size.  Per general surgical notes from 06/2019, appears developed enterocutaneous fistula postop from  perforated ulcer treated with oversewing and Graham patch.  Wound care following.  Patient received a dose of Questran with some improvement with watery stools.  Continue PPI.  May need to be placed on Questran daily will monitor  for now.  3.  Diabetes mellitus type 2 Hemoglobin A1c 7.0.  CBG of 144 this morning.  Elevated blood glucose levels likely secondary to TPN.  Patient started on Lantus 5 units daily.  Patient with repeat positive blood cultures and as such PICC line removed on 08/23/2019 and TPN held.  Lantus has been discontinued.  Continue sliding scale insulin.  Continue Neurontin.  Outpatient follow-up with PCP.   4.  Anemia/iron deficiency anemia of chronic illness Patient noted to have a drop in hemoglobin with no signs of overt bleeding felt likely dilutional.  Status post 2 units packed red blood cells hemoglobin has slowly been trending down and currently at 8.0 from 8.7 from 9.2 from 9.6 from 9.8 from 9.9 from 10.2 from 5.6.  Patient noted to be having brown stool however Hemoccult was positive.  As patient has had a continued drop in her hemoglobin will consult with GI for further evaluation and management.  Status post IV Feraheme.  Continue oral iron supplementation.  Follow.    5.  Acute kidney injury Resolved with hydration.   6.  Proteus mirabilis UTI Patient on presentation had complaints of dysuria.  Urine cultures with > 100,000 colonies of Proteus mirabilis.  Clinical improvement.  Afebrile.  Status post 2 days IV cefepime and 1 dose of IV Rocephin.  No further antibiotics needed.  Discussed with ID.  7.  Hypertension Lopressor, Norvasc.  Follow.     8.  Stage II pressure ulcer, chronic, POA Patient has been assessed by wound care.  Continue current wound care.   Pressure Injury 05/22/19 Coccyx Bilateral Stage 4 - Full thickness tissue loss with exposed bone, tendon or muscle. Per wound nurse, healing stage IV from prior admission (Active)  05/22/19 2104  Location: Coccyx  Location Orientation: Bilateral  Staging: Stage 4 - Full thickness tissue loss with exposed bone, tendon or muscle.  Wound Description (Comments): Per wound nurse, healing stage IV from prior admission  Present on  Admission: Yes     Pressure Injury 05/22/19 Knee Right;Distal Unstageable - Full thickness tissue loss in which the base of the injury is covered by slough (yellow, tan, gray, green or brown) and/or eschar (tan, brown or black) in the wound bed. Stump (Active)  05/22/19 2106  Location: Knee  Location Orientation: Right;Distal  Staging: Unstageable - Full thickness tissue loss in which the base of the injury is covered by slough (yellow, tan, gray, green or brown) and/or eschar (tan, brown or black) in the wound bed.  Wound Description (Comments): Stump  Present on Admission: Yes     Pressure Injury 05/22/19 Knee Left;Distal Unstageable - Full thickness tissue loss in which the base of the injury is covered by slough (yellow, tan, gray, green or brown) and/or eschar (tan, brown or black) in the wound bed. Stump (Active)  05/22/19 2108  Location: Knee  Location Orientation: Left;Distal  Staging: Unstageable - Full thickness tissue loss in which the base of the injury is covered by slough (yellow, tan, gray, green or brown) and/or eschar (tan, brown or black) in the wound bed.  Wound Description (Comments): Stump  Present on Admission: Yes     Pressure Injury 05/22/19 Thigh Left;Mid Stage 2 -  Partial thickness loss of dermis presenting  as a shallow open injury with a red, pink wound bed without slough. (Active)  05/22/19 2109  Location: Thigh  Location Orientation: Left;Mid  Staging: Stage 2 -  Partial thickness loss of dermis presenting as a shallow open injury with a red, pink wound bed without slough.  Wound Description (Comments):   Present on Admission: Yes     Pressure Injury 07/22/19 Sacrum Mid Stage 3 -  Full thickness tissue loss. Subcutaneous fat may be visible but bone, tendon or muscle are NOT exposed. (Active)  07/22/19   Location: Sacrum  Location Orientation: Mid  Staging: Stage 3 -  Full thickness tissue loss. Subcutaneous fat may be visible but bone, tendon or muscle are  NOT exposed.  Wound Description (Comments):   Present on Admission: Yes     Pressure Injury 07/22/19 Leg Left Stage 3 -  Full thickness tissue loss. Subcutaneous fat may be visible but bone, tendon or muscle are NOT exposed. (Active)  07/22/19   Location: Leg  Location Orientation: Left  Staging: Stage 3 -  Full thickness tissue loss. Subcutaneous fat may be visible but bone, tendon or muscle are NOT exposed.  Wound Description (Comments):   Present on Admission: Yes     Pressure Injury 07/22/19 Leg Right Stage 3 -  Full thickness tissue loss. Subcutaneous fat may be visible but bone, tendon or muscle are NOT exposed. (Active)  07/22/19   Location: Leg  Location Orientation: Right  Staging: Stage 3 -  Full thickness tissue loss. Subcutaneous fat may be visible but bone, tendon or muscle are NOT exposed.  Wound Description (Comments):   Present on Admission: Yes        DVT prophylaxis: Lovenox on hold due to anemia and positive Hemoccult. Code Status: DNR Family Communication: Updated patient.  No family at bedside. Disposition:   Status is: Inpatient    Dispo: The patient is from: Home              Anticipated d/c is to: Home with home health.              Anticipated d/c date is: Hopefully in 5 to 6 days once repeat blood cultures are negative and when okay with ID.               Patient currently on IV Eraxis, Candida bacteremia, PICC line placed back in 08/21/2019 and TPN started.  Patient now with blood cultures coming back positive from 08/19/2019 and as such PICC line removed 08/23/2019.  Repeat blood cultures(08/23/2019) pending.  Not ready for discharge.         Consultants:   Ophthalmology: Dr. Ellie Lunch 08/19/2019  ID: Dr. Megan Salon 08/17/2019  Procedures:   CT abdomen and pelvis 08/18/2019  Chest x-ray 08/17/2019  2D echo 08/18/2019  PICC line removal  2 units packed red blood cell transfusion 08/18/2019  PICC line 08/21/2019  PICC line removal  08/23/2019  Antimicrobials:  IV Eraxis 08/18/2019>>>>   IV cefepime 08/17/2019>>>> 08/18/2019  IV Rocephin 6/9/2021x1 dose   Subjective: Patient laying in bed.  Denies any nausea or emesis.  No chest pain.  No shortness of breath.  Asking when she is going to be able to go home  Objective: Vitals:   08/24/19 1952 08/25/19 0010 08/25/19 0503 08/25/19 0743  BP: 115/64 (!) 98/56 127/73   Pulse: 72 72 67   Resp: 13 17 15    Temp: 98.6 F (37 C) 98 F (36.7 C) 98 F (36.7 C) 98 F (36.7  C)  TempSrc: Oral Oral Oral Oral  SpO2: 98% 100% 100%   Weight:      Height:        Intake/Output Summary (Last 24 hours) at 08/25/2019 1029 Last data filed at 08/25/2019 0503 Gross per 24 hour  Intake 1306.84 ml  Output 800 ml  Net 506.84 ml   Filed Weights   08/17/19 0900  Weight: 49.9 kg    Examination:  General exam: NAD Respiratory system: Lungs" bilaterally.  No wheezes, no crackles, no rhonchi.  Normal respiratory effort.  Cardiovascular system: Regular rate rhythm no murmurs rubs or gallops.  No JVD.  Gastrointestinal system: Abdomen is soft, nontender, nondistended, positive bowel sounds.  Ostomy with combination of loose and chunky stools noted.  Central nervous system: Alert and oriented. No focal neurological deficits. Extremities: Status post bilateral BKA.  Skin: No rashes, lesions or ulcers Psychiatry: Judgement and insight appear normal. Mood & affect appropriate.     Data Reviewed: I have personally reviewed following labs and imaging studies  CBC: Recent Labs  Lab 08/20/19 0235 08/21/19 0308 08/22/19 0640 08/24/19 1744 08/25/19 0605  WBC 6.2 8.4 8.2 8.0 7.4  NEUTROABS 3.8 5.2 5.9 4.9 5.6  HGB 9.8* 9.6* 9.2* 8.7* 8.0*  HCT 29.8* 29.7* 28.3* 28.1* 25.7*  MCV 87.6 87.6 89.3 91.8 92.1  PLT 145* 163 201 279 852    Basic Metabolic Panel: Recent Labs  Lab 08/20/19 0235 08/20/19 0235 08/21/19 0308 08/22/19 0640 08/23/19 0243 08/24/19 1744 08/25/19 0605  NA  136   < > 134* 130* 134* 135 134*  K 3.5   < > 4.1 4.7 4.4 4.5 3.9  CL 110   < > 110 103 105 108 105  CO2 19*   < > 19* 20* 21* 19* 21*  GLUCOSE 104*   < > 73 447* 234* 152* 196*  BUN 36*   < > 29* 40* 41* 40* 38*  CREATININE 0.94   < > 1.08* 0.94 0.74 0.80 0.85  CALCIUM 8.9   < > 8.8* 8.4* 8.7* 8.8* 8.7*  MG 1.8   < > 2.6* 2.1 2.0 1.8 1.7  PHOS 3.3  --   --  3.6 3.4 3.6 3.6   < > = values in this interval not displayed.    GFR: Estimated Creatinine Clearance: 56.8 mL/min (by C-G formula based on SCr of 0.85 mg/dL).  Liver Function Tests: Recent Labs  Lab 08/19/19 0619 08/20/19 0235 08/22/19 0640 08/25/19 0605  AST 30 22 55* 11*  ALT 24 21 41 22  ALKPHOS 58 75 266* 187*  BILITOT 0.7 0.7 1.7* 0.7  PROT 6.2* 6.3* 6.4* 6.6  ALBUMIN 2.0* 1.8* 1.8* 1.7*    CBG: Recent Labs  Lab 08/24/19 0752 08/24/19 1126 08/24/19 1548 08/24/19 2348 08/25/19 0612  GLUCAP 123* 144* 132* 163* 185*     Recent Results (from the past 240 hour(s))  SARS Coronavirus 2 by RT PCR (hospital order, performed in [facility] hospital lab) Nasopharyngeal Nasopharyngeal Swab     Status: None   Collection Time: 08/17/19  8:55 AM   Specimen: Nasopharyngeal Swab  Result Value Ref Range Status   SARS Coronavirus 2 NEGATIVE NEGATIVE Final    Comment: (NOTE) SARS-CoV-2 target nucleic acids are NOT DETECTED. The SARS-CoV-2 RNA is generally detectable in upper and lower respiratory specimens during the acute phase of infection. The lowest concentration of SARS-CoV-2 viral copies this assay can detect is 250 copies / mL. A negative result does not preclude  SARS-CoV-2 infection and should not be used as the sole basis for treatment or other patient management decisions.  A negative result may occur with improper specimen collection / handling, submission of specimen other than nasopharyngeal swab, presence of viral mutation(s) within the areas targeted by this assay, and inadequate number of viral  copies (<250 copies / mL). A negative result must be combined with clinical observations, patient history, and epidemiological information. Fact Sheet for Patients:   StrictlyIdeas.no Fact Sheet for Healthcare Providers: BankingDealers.co.za This test is not yet approved or cleared  by the [place] FDA and has been authorized for detection and/or diagnosis of SARS-CoV-2 by FDA under an Emergency Use Authorization (EUA).  This EUA will remain in effect (meaning this test can be used) for the duration of the COVID-19 declaration under Section 564(b)(1) of the Act, 21 U.S.C. section 360bbb-3(b)(1), unless the authorization is terminated or revoked sooner. Performed at [facility], [place] [address], [place], [place] [address]   Urine culture     Status: Abnormal   Collection Time: 08/17/19  9:05 AM   Specimen: In/Out Cath Urine  Result Value Ref Range Status   Specimen Description IN/OUT CATH URINE  Final   Special Requests   Final    NONE Performed at [facility], [place] [address], [place], [place] [address]    Culture >=100,000 COLONIES/mL PROTEUS MIRABILIS (A)  Final   Report Status 08/20/2019 FINAL  Final   Organism ID, Bacteria PROTEUS MIRABILIS (A)  Final      Susceptibility   Proteus mirabilis - MIC*    AMPICILLIN <=2 SENSITIVE Sensitive     CEFAZOLIN <=4 SENSITIVE Sensitive     CEFTRIAXONE <=1 SENSITIVE Sensitive     CIPROFLOXACIN >=4 RESISTANT Resistant     GENTAMICIN <=1 SENSITIVE Sensitive     IMIPENEM 2 SENSITIVE Sensitive     NITROFURANTOIN 128 RESISTANT Resistant     TRIMETH/SULFA >=320 RESISTANT Resistant     AMPICILLIN/SULBACTAM <=2 SENSITIVE Sensitive     PIP/TAZO <=4 SENSITIVE Sensitive     * >=100,000 COLONIES/mL PROTEUS MIRABILIS  Blood Culture (routine x 2)     Status: Abnormal   Collection Time: 08/17/19  9:50 AM   Specimen: BLOOD LEFT ARM  Result Value Ref Range Status   Specimen Description  BLOOD LEFT ARM  Final   Special Requests   Final    BOTTLES DRAWN AEROBIC AND ANAEROBIC Blood Culture adequate volume   Culture  Setup Time   Final    BUDDING YEAST SEEN IN BOTH AEROBIC AND ANAEROBIC BOTTLES CRITICAL RESULT CALLED TO, READ BACK BY AND VERIFIED WITH: Park Breed [id number] FCP Performed at [facility], [address], [place], [place] [address]    Culture CANDIDA GLABRATA (A)  Final   Report Status 08/21/2019 FINAL  Final  Blood Culture ID Panel (Reflexed)     Status: Abnormal   Collection Time: 08/17/19  9:50 AM  Result Value Ref Range Status   Enterococcus species NOT DETECTED NOT DETECTED Final   Listeria monocytogenes NOT DETECTED NOT DETECTED Final   Staphylococcus species NOT DETECTED NOT DETECTED Final   Staphylococcus aureus (BCID) NOT DETECTED NOT DETECTED Final   Streptococcus species NOT DETECTED NOT DETECTED Final   Streptococcus agalactiae NOT DETECTED NOT DETECTED Final   Streptococcus pneumoniae NOT DETECTED NOT DETECTED Final   Streptococcus pyogenes NOT DETECTED NOT DETECTED Final   Acinetobacter baumannii NOT DETECTED NOT DETECTED Final   Enterobacteriaceae  species NOT DETECTED NOT DETECTED Final   Enterobacter cloacae complex NOT DETECTED NOT DETECTED Final   Escherichia coli NOT DETECTED NOT DETECTED Final   Klebsiella oxytoca NOT DETECTED NOT DETECTED Final   Klebsiella pneumoniae NOT DETECTED NOT DETECTED Final   Proteus species NOT DETECTED NOT DETECTED Final   Serratia marcescens NOT DETECTED NOT DETECTED Final   Haemophilus influenzae NOT DETECTED NOT DETECTED Final   Neisseria meningitidis NOT DETECTED NOT DETECTED Final   Pseudomonas aeruginosa NOT DETECTED NOT DETECTED Final   Candida albicans NOT DETECTED NOT DETECTED Final   Candida glabrata DETECTED (A) NOT DETECTED Final    Comment: CRITICAL RESULT CALLED TO, READ BACK BY AND VERIFIED WITH: PHARMD JEREMY F. [id number] FCP    Candida krusei NOT DETECTED NOT DETECTED  Final   Candida parapsilosis NOT DETECTED NOT DETECTED Final   Candida tropicalis NOT DETECTED NOT DETECTED Final    Comment: Performed at [facility], [place] [address] [place], [place] [address]  Blood Culture (routine x 2)     Status: Abnormal   Collection Time: 08/17/19  1:12 PM   Specimen: BLOOD LEFT ARM  Result Value Ref Range Status   Specimen Description BLOOD LEFT ARM  Final   Special Requests   Final    BOTTLES DRAWN AEROBIC ONLY Blood Culture adequate volume   Culture  Setup Time   Final    AEROBIC BOTTLE ONLY YEAST CRITICAL VALUE NOTED.  VALUE IS CONSISTENT WITH PREVIOUSLY REPORTED AND CALLED VALUE. Performed at [facility], [place] [address], [place], [place] [address]    Culture CANDIDA GLABRATA (A)  Final   Report Status 08/21/2019 FINAL  Final  MRSA PCR Screening     Status: Abnormal   Collection Time: 08/17/19  8:23 PM   Specimen: Nasal Mucosa; Nasopharyngeal  Result Value Ref Range Status   MRSA by PCR POSITIVE (A) NEGATIVE Final    Comment:        The GeneXpert MRSA Assay (FDA approved for NASAL specimens only), is one component of a comprehensive MRSA colonization surveillance program. It is not intended to diagnose MRSA infection nor to guide or monitor treatment for MRSA infections. RESULT CALLED TO, READ BACK BY AND VERIFIED WITH: J. MILLER,CHARGE RN [id number] 08/17/2019 Mena Goes Performed at [facility], [place] [address] [place], [place] [address]   Culture, blood (routine x 2)     Status: Abnormal (Preliminary result)   Collection Time: 08/19/19  5:56 AM   Specimen: BLOOD LEFT HAND  Result Value Ref Range Status   Specimen Description BLOOD LEFT HAND  Final   Special Requests   Final    BOTTLES DRAWN AEROBIC AND ANAEROBIC Blood Culture adequate volume   Culture  Setup Time   Final    ANAEROBIC BOTTLE ONLY YEAST CRITICAL VALUE NOTED.  VALUE IS CONSISTENT WITH PREVIOUSLY REPORTED AND CALLED VALUE. Performed at [facility],  [place] [address], [place], [place] [address]    Culture CANDIDA GLABRATA (A)  Final   Report Status PENDING  Incomplete  Culture, blood (routine x 2)     Status: Abnormal   Collection Time: 08/19/19  5:56 AM   Specimen: BLOOD LEFT HAND  Result Value Ref Range Status   Specimen Description BLOOD LEFT HAND  Final   Special Requests   Final    BOTTLES DRAWN AEROBIC AND ANAEROBIC Blood Culture adequate volume   Culture  Setup Time (A)  Final  YEAST AEROBIC BOTTLE ONLY CRITICAL RESULT CALLED TO, READ BACK BY AND VERIFIED WITH: PHARMD Y PATEL [id number] AT 78 AM BY CM Performed at [facility], [place] [address], [place], [place] [address]    Culture CANDIDA GLABRATA (A)  Final   Report Status 08/24/2019 FINAL  Final  Culture, blood (routine x 2)     Status: None (Preliminary result)   Collection Time: 08/23/19 11:17 AM   Specimen: BLOOD LEFT FOREARM  Result Value Ref Range Status   Specimen Description BLOOD LEFT FOREARM  Final   Special Requests   Final    BOTTLES DRAWN AEROBIC ONLY Blood Culture adequate volume   Culture   Final    NO GROWTH 2 DAYS Performed at [facility], [place] [address], [place], [place] [address]    Report Status PENDING  Incomplete  Culture, blood (routine x 2)     Status: None (Preliminary result)   Collection Time: 08/23/19 11:22 AM   Specimen: BLOOD LEFT FOREARM  Result Value Ref Range Status   Specimen Description BLOOD LEFT FOREARM  Final   Special Requests   Final    BOTTLES DRAWN AEROBIC ONLY Blood Culture adequate volume   Culture   Final    NO GROWTH 2 DAYS Performed at [facility], [place] [address], [place], [place] [address]    Report Status PENDING  Incomplete         Radiology Studies: No results found.      Scheduled Meds: . amLODipine  5 mg Oral Daily  . ascorbic acid  500 mg Oral BID  . Chlorhexidine Gluconate Cloth  6 each Topical Daily  . escitalopram  20 mg Oral Daily  . feeding supplement  1 Container Oral TID  BM  . ferrous sulfate  300 mg Oral Q breakfast  . gabapentin  300 mg Oral TID  . insulin aspart  0-9 Units Subcutaneous TID WC  . melatonin  3 mg Oral QHS  . metoprolol tartrate  50 mg Oral BID  . pantoprazole  40 mg Oral BID  . sodium chloride flush  10-40 mL Intracatheter Q12H   Continuous Infusions: . anidulafungin 78 mL/hr at 08/25/19 0015  . magnesium sulfate bolus IVPB       LOS: 8 days    Time spent: 35 minutes    Irine Seal, MD [facility]   To contact the attending provider between 7A-7P or the covering provider during after hours 7P-7A, please log into the web site [email or web address] and access using universal [place] password for that web site. If you do not have the password, please call the hospital operator.  08/25/2019, 10:29 AM

## 2019-08-25 NOTE — Progress Notes (Signed)
Patient ID: Gina [name], female   DOB: [birth year], 59 y.o.   MRN: [id number]         [facility]  Date of Admission:  08/17/2019           Day 8 anidulafungin ASSESSMENT: Gina [name] remains afebrile and clinically stable.  Blood cultures on 08/19/2019 grew Candida again.  Repeat blood cultures done 2 days ago remain negative.  I agree with holding off on TEE.  PLAN: 1. Continue anidulafungin pending final blood cultures  Principal Problem:   Candidemia ([place]) Active Problems:   HTN (hypertension)   Diabetes ([place])   Anemia of chronic disease   Schizophrenia ([place])   Acute kidney injury ([place])   Sacral decubitus ulcer   Normocytic anemia   S/P BKA (below knee amputation) bilateral (HCC)   Status post colostomy (HCC)   Enterocutaneous fistula   Urinary tract infection due to Proteus   Iron deficiency anemia   Scheduled Meds: . amLODipine  5 mg Oral Daily  . ascorbic acid  500 mg Oral BID  . Chlorhexidine Gluconate Cloth  6 each Topical Daily  . escitalopram  20 mg Oral Daily  . feeding supplement  1 Container Oral TID BM  . ferrous sulfate  300 mg Oral Q breakfast  . gabapentin  300 mg Oral TID  . insulin aspart  0-9 Units Subcutaneous TID WC  . melatonin  3 mg Oral QHS  . metoprolol tartrate  50 mg Oral BID  . pantoprazole  40 mg Oral BID  . sodium chloride flush  10-40 mL Intracatheter Q12H   Continuous Infusions: . anidulafungin 78 mL/hr at 08/25/19 0015  . magnesium sulfate bolus IVPB     PRN Meds:.acetaminophen, dextrose, loperamide, metoprolol tartrate, ondansetron, oxyCODONE, prochlorperazine, simethicone, sodium chloride, sodium chloride flush   Review of Systems: Review of Systems  Unable to perform ROS: Mental acuity    Allergies  Allergen Reactions  . Bee Venom Swelling    Swells up with bee stings   . Metformin And Related Other (See Comments)    Upset stomach    OBJECTIVE: Vitals:   08/24/19 1952 08/25/19 0010  08/25/19 0503 08/25/19 0743  BP: 115/64 (!) 98/56 127/73   Pulse: 72 72 67   Resp: 13 17 15    Temp: 98.6 F (37 C) 98 F (36.7 C) 98 F (36.7 C) 98 F (36.7 C)  TempSrc: Oral Oral Oral Oral  SpO2: 98% 100% 100%   Weight:      Height:       Body mass index is 20.12 kg/m.  Physical Exam Constitutional:      Comments: She is sleeping quietly in bed.  Cardiovascular:     Rate and Rhythm: Normal rate and regular rhythm.     Heart sounds: No murmur heard.   Pulmonary:     Effort: Pulmonary effort is normal.     Breath sounds: Normal breath sounds.     Lab Results Lab Results  Component Value Date   WBC 7.4 08/25/2019   HGB 8.0 (L) 08/25/2019   HCT 25.7 (L) 08/25/2019   MCV 92.1 08/25/2019   PLT 311 08/25/2019    Lab Results  Component Value Date   CREATININE 0.85 08/25/2019   BUN 38 (H) 08/25/2019   NA 134 (L) 08/25/2019   K 3.9 08/25/2019   CL 105 08/25/2019   CO2 21 (L) 08/25/2019    Lab Results  Component Value Date   ALT 22 08/25/2019  AST 11 (L) 08/25/2019   ALKPHOS 187 (H) 08/25/2019   BILITOT 0.7 08/25/2019     Microbiology: Recent Results (from the past 240 hour(s))  SARS Coronavirus 2 by RT PCR (hospital order, performed in [facility] hospital lab) Nasopharyngeal Nasopharyngeal Swab     Status: None   Collection Time: 08/17/19  8:55 AM   Specimen: Nasopharyngeal Swab  Result Value Ref Range Status   SARS Coronavirus 2 NEGATIVE NEGATIVE Final    Comment: (NOTE) SARS-CoV-2 target nucleic acids are NOT DETECTED. The SARS-CoV-2 RNA is generally detectable in upper and lower respiratory specimens during the acute phase of infection. The lowest concentration of SARS-CoV-2 viral copies this assay can detect is 250 copies / mL. A negative result does not preclude SARS-CoV-2 infection and should not be used as the sole basis for treatment or other patient management decisions.  A negative result may occur with improper specimen collection / handling,  submission of specimen other than nasopharyngeal swab, presence of viral mutation(s) within the areas targeted by this assay, and inadequate number of viral copies (<250 copies / mL). A negative result must be combined with clinical observations, patient history, and epidemiological information. Fact Sheet for Patients:   StrictlyIdeas.no Fact Sheet for Healthcare Providers: BankingDealers.co.za This test is not yet approved or cleared  by the [place] FDA and has been authorized for detection and/or diagnosis of SARS-CoV-2 by FDA under an Emergency Use Authorization (EUA).  This EUA will remain in effect (meaning this test can be used) for the duration of the COVID-19 declaration under Section 564(b)(1) of the Act, 21 U.S.C. section 360bbb-3(b)(1), unless the authorization is terminated or revoked sooner. Performed at [facility], [place] [address] [place], [place] [address]   Urine culture     Status: Abnormal   Collection Time: 08/17/19  9:05 AM   Specimen: In/Out Cath Urine  Result Value Ref Range Status   Specimen Description IN/OUT CATH URINE  Final   Special Requests   Final    NONE Performed at [facility], [place] [address] [place],  [address]    Culture >=100,000 COLONIES/mL PROTEUS MIRABILIS (A)  Final   Report Status 08/20/2019 FINAL  Final   Organism ID, Bacteria PROTEUS MIRABILIS (A)  Final      Susceptibility   Proteus mirabilis - MIC*    AMPICILLIN <=2 SENSITIVE Sensitive     CEFAZOLIN <=4 SENSITIVE Sensitive     CEFTRIAXONE <=1 SENSITIVE Sensitive     CIPROFLOXACIN >=4 RESISTANT Resistant     GENTAMICIN <=1 SENSITIVE Sensitive     IMIPENEM 2 SENSITIVE Sensitive     NITROFURANTOIN 128 RESISTANT Resistant     TRIMETH/SULFA >=320 RESISTANT Resistant     AMPICILLIN/SULBACTAM <=2 SENSITIVE Sensitive     PIP/TAZO <=4 SENSITIVE Sensitive     * >=100,000 COLONIES/mL PROTEUS MIRABILIS  Blood  Culture (routine x 2)     Status: Abnormal   Collection Time: 08/17/19  9:50 AM   Specimen: BLOOD LEFT ARM  Result Value Ref Range Status   Specimen Description BLOOD LEFT ARM  Final   Special Requests   Final    BOTTLES DRAWN AEROBIC AND ANAEROBIC Blood Culture adequate volume   Culture  Setup Time   Final    BUDDING YEAST SEEN IN BOTH AEROBIC AND ANAEROBIC BOTTLES CRITICAL RESULT CALLED TO, READ BACK BY AND VERIFIED WITH: Park Breed [id number] FCP Performed at [facility], [address],  [place] [address]    Culture CANDIDA GLABRATA (A)  Final   Report Status 08/21/2019 FINAL  Final  Blood Culture ID Panel (Reflexed)     Status: Abnormal   Collection Time: 08/17/19  9:50 AM  Result Value Ref Range Status   Enterococcus species NOT DETECTED NOT DETECTED Final   Listeria monocytogenes NOT DETECTED NOT DETECTED Final   Staphylococcus species NOT DETECTED NOT DETECTED Final   Staphylococcus aureus (BCID) NOT DETECTED NOT DETECTED Final   Streptococcus species NOT DETECTED NOT DETECTED Final   Streptococcus agalactiae NOT DETECTED NOT DETECTED Final   Streptococcus pneumoniae NOT DETECTED NOT DETECTED Final   Streptococcus pyogenes NOT DETECTED NOT DETECTED Final   Acinetobacter baumannii NOT DETECTED NOT DETECTED Final   Enterobacteriaceae species NOT DETECTED NOT DETECTED Final   Enterobacter cloacae complex NOT DETECTED NOT DETECTED Final   Escherichia coli NOT DETECTED NOT DETECTED Final   Klebsiella oxytoca NOT DETECTED NOT DETECTED Final   Klebsiella pneumoniae NOT DETECTED NOT DETECTED Final   Proteus species NOT DETECTED NOT DETECTED Final   Serratia marcescens NOT DETECTED NOT DETECTED Final   Haemophilus influenzae NOT DETECTED NOT DETECTED Final   Neisseria meningitidis NOT DETECTED NOT DETECTED Final   Pseudomonas aeruginosa NOT DETECTED NOT DETECTED Final   Candida albicans NOT DETECTED NOT DETECTED Final   Candida glabrata DETECTED (A) NOT  DETECTED Final    Comment: CRITICAL RESULT CALLED TO, READ BACK BY AND VERIFIED WITH: PHARMD JEREMY F. [phone] FCP    Candida krusei NOT DETECTED NOT DETECTED Final   Candida parapsilosis NOT DETECTED NOT DETECTED Final   Candida tropicalis NOT DETECTED NOT DETECTED Final    Comment: Performed at [facility], [address], [place], [place] [address]  Blood Culture (routine x 2)     Status: Abnormal   Collection Time: 08/17/19  1:12 PM   Specimen: BLOOD LEFT ARM  Result Value Ref Range Status   Specimen Description BLOOD LEFT ARM  Final   Special Requests   Final    BOTTLES DRAWN AEROBIC ONLY Blood Culture adequate volume   Culture  Setup Time   Final    AEROBIC BOTTLE ONLY YEAST CRITICAL VALUE NOTED.  VALUE IS CONSISTENT WITH PREVIOUSLY REPORTED AND CALLED VALUE. Performed at [facility], [place] [address], [place], [place] [address]    Culture CANDIDA GLABRATA (A)  Final   Report Status 08/21/2019 FINAL  Final  MRSA PCR Screening     Status: Abnormal   Collection Time: 08/17/19  8:23 PM   Specimen: Nasal Mucosa; Nasopharyngeal  Result Value Ref Range Status   MRSA by PCR POSITIVE (A) NEGATIVE Final    Comment:        The GeneXpert MRSA Assay (FDA approved for NASAL specimens only), is one component of a comprehensive MRSA colonization surveillance program. It is not intended to diagnose MRSA infection nor to guide or monitor treatment for MRSA infections. RESULT CALLED TO, READ BACK BY AND VERIFIED WITH: J. MILLER,CHARGE RN [id number] 08/17/2019 Mena Goes Performed at [facility], [place] [address] [place], [place] [address]   Culture, blood (routine x 2)     Status: Abnormal (Preliminary result)   Collection Time: 08/19/19  5:56 AM   Specimen: BLOOD LEFT HAND  Result Value Ref Range Status   Specimen Description BLOOD LEFT HAND  Final   Special Requests   Final    BOTTLES DRAWN AEROBIC AND ANAEROBIC Blood Culture adequate volume  Culture  Setup Time    Final    ANAEROBIC BOTTLE ONLY YEAST CRITICAL VALUE NOTED.  VALUE IS CONSISTENT WITH PREVIOUSLY REPORTED AND CALLED VALUE. Performed at [facility], [place] [address], [place], [place] [address]    Culture CANDIDA GLABRATA (A)  Final   Report Status PENDING  Incomplete  Culture, blood (routine x 2)     Status: Abnormal   Collection Time: 08/19/19  5:56 AM   Specimen: BLOOD LEFT HAND  Result Value Ref Range Status   Specimen Description BLOOD LEFT HAND  Final   Special Requests   Final    BOTTLES DRAWN AEROBIC AND ANAEROBIC Blood Culture adequate volume   Culture  Setup Time (A)  Final    YEAST AEROBIC BOTTLE ONLY CRITICAL RESULT CALLED TO, READ BACK BY AND VERIFIED WITH: PHARMD Y PATEL [id number] AT 32 AM BY CM Performed at [facility], [place] [address] [place], [place] [address]    Culture CANDIDA GLABRATA (A)  Final   Report Status 08/24/2019 FINAL  Final  Culture, blood (routine x 2)     Status: None (Preliminary result)   Collection Time: 08/23/19 11:17 AM   Specimen: BLOOD LEFT FOREARM  Result Value Ref Range Status   Specimen Description BLOOD LEFT FOREARM  Final   Special Requests   Final    BOTTLES DRAWN AEROBIC ONLY Blood Culture adequate volume   Culture   Final    NO GROWTH 2 DAYS Performed at [facility], [place] [address], [place], [place] [address]    Report Status PENDING  Incomplete  Culture, blood (routine x 2)     Status: None (Preliminary result)   Collection Time: 08/23/19 11:22 AM   Specimen: BLOOD LEFT FOREARM  Result Value Ref Range Status   Specimen Description BLOOD LEFT FOREARM  Final   Special Requests   Final    BOTTLES DRAWN AEROBIC ONLY Blood Culture adequate volume   Culture   Final    NO GROWTH 2 DAYS Performed at [facility], [place] [address], [place], [place] [address]    Report Status PENDING  Incomplete    Michel Bickers, MD [facility] [place] [facility] [phone] pager   [phone] cell 08/25/2019, 10:42 AM

## 2019-08-25 NOTE — Progress Notes (Signed)
.  PHARMACY - TOTAL PARENTERAL NUTRITION CONSULT NOTE   Indication: Fistula  Patient Measurements: Height: '5\' 2"'$  (157.5 cm) Weight: 49.9 kg (110 lb) IBW/kg (Calculated) : 50.1 TPN AdjBW (KG): 49.9 Body mass index is 20.12 kg/m. Usual Weight: 53-60 kg  Assessment: 59 yo female presents on 08/17/2019 with fever, dysuria, and decreased intake. On presentation, patient found to have blood glucose of > 600 and started on insulin gtt. Prior to admission patient is on chronic cyclic TPN via R arm PICC and soft diet. TPN was initiated in 02/2019 after perforated prepyloric ulcer which was repaired with a [name] patch and continued to leak. Patient currently has an Chief Strategy Officer in place from January 2021 due to [facility] fistula.   Per patient, decreased appetite and po intake for previous 3-4 days prior to presentation. She reports transitioning from a normal diet of foods such as grits, chicken, tuna, chicken noodle soup, chips, jello, and pudding, to full liquid diet of mainly soup. Per outpatient records, she is on a 14-hour TPN cycle.   TPN was held on admission until 6/10 due to candidemia which is being treated with anidulafungin. PICC line was pulled and reinserted 6/10. Pharmacy consulted to resume TPN on 08/21/19 then blood culture from 6/8 grew 2/4 c. Glabrata and PICC was removed on 6/12 again and TPN held. Blood cultures obtained on 6/12 currently no growth to date.    Glucose / Insulin: Hx of DM (controlled by diet outpt). Hyperglycemia on admission, started on insulin gtt, then stable for days. CBGs improved but remain uncontrolled back on continuous TPN. Now Lantus d/c'd 6/12 with TPN held again and CBGs controlled off TPN. 5 units/24 hours.  Electrolytes: Na 134. Corrected Ca 10.4. CO2 21. Mg 1.7. Others wnl Renal: Scr down to 0.74, baseline Scr 0.6-0.8. LFTs / TGs: Alk phos 187 - decrease. AST/ ALT wnl. Tbili 0.7 - decrease. TG 252. Prealbumin / albumin: prealbumin 9.4, albumin 1.7.  Intake /  Output; MIVF: no MIVF GI Imaging: 6/7 CT abd - ongoing enterocutaneous fistula, no significant change in size of fluid collection despite removng perc drainage catheter Surgeries / Procedures:   Central access: PICC removed 6/12 TPN start date: Chronic TPN prior to admission since 02/2019; TPN resumed on 6/10-6/12  Nutritional Goals (per outpatient records): kCal: 1471 kcal, Protein: 93 g, Fluid: 1800 ml Goal TPN rate is 75 mL/hr (provides 90g of protein and 1476 kcals per day)  RD goals per note 6/11: KCal: 1500-1700, Protein: 65-80 g, Fluid: >/=1.5L  Current Nutrition:  Boost Breeze PO TID Soft diet Resumed home 44-HQ cyclic TPN on 7/59, back to continuous 24-hr cycle 6/11; now TPN held again 6/12  Plan:  Continue to hold TPN for now due to PICC line removal 6/12 for persistently positive BCx Follow up blood cultures and plans to place PICC and resume TPN Continue sSSI and adjust as needed Per Dr. [place] Silos, will monitor but hold off on hanging D10 or D5 for now   Cristela Felt, PharmD PGY1 Pharmacy Resident [employer]: [phone]  08/25/2019 6:52 AM

## 2019-08-26 ENCOUNTER — Inpatient Hospital Stay (HOSPITAL_COMMUNITY): Payer: Medicare HMO

## 2019-08-26 ENCOUNTER — Encounter: Payer: Self-pay | Admitting: General Practice

## 2019-08-26 LAB — CULTURE, BLOOD (ROUTINE X 2): Special Requests: ADEQUATE

## 2019-08-26 LAB — BASIC METABOLIC PANEL
Anion gap: 7 (ref 5–15)
BUN: 35 mg/dL — ABNORMAL HIGH (ref 6–20)
CO2: 20 mmol/L — ABNORMAL LOW (ref 22–32)
Calcium: 9 mg/dL (ref 8.9–10.3)
Chloride: 110 mmol/L (ref 98–111)
Creatinine, Ser: 0.8 mg/dL (ref 0.44–1.00)
GFR calc Af Amer: >60 mL/min (ref >60)
GFR calc non Af Amer: >60 mL/min (ref >60)
Glucose, Bld: 199 mg/dL — ABNORMAL HIGH (ref 70–99)
Potassium: 3.8 mmol/L (ref 3.5–5.1)
Sodium: 137 mmol/L (ref 135–145)

## 2019-08-26 LAB — CBC WITH DIFFERENTIAL/PLATELET
Abs Immature Granulocytes: 0.04 10*3/uL (ref 0.00–0.07)
Basophils Absolute: 0 10*3/uL (ref 0.0–0.1)
Basophils Relative: 0 %
Eosinophils Absolute: 0.1 10*3/uL (ref 0.0–0.5)
Eosinophils Relative: 1 %
HCT: 26.9 % — ABNORMAL LOW (ref 36.0–46.0)
Hemoglobin: 8.5 g/dL — ABNORMAL LOW (ref 12.0–15.0)
Immature Granulocytes: 1 %
Lymphocytes Relative: 29 %
Lymphs Abs: 2.1 10*3/uL (ref 0.7–4.0)
MCH: 29.1 pg (ref 26.0–34.0)
MCHC: 31.6 g/dL (ref 30.0–36.0)
MCV: 92.1 fL (ref 80.0–100.0)
Monocytes Absolute: 1.1 10*3/uL — ABNORMAL HIGH (ref 0.1–1.0)
Monocytes Relative: 16 %
Neutro Abs: 3.8 10*3/uL (ref 1.7–7.7)
Neutrophils Relative %: 53 %
Platelets: 333 10*3/uL (ref 150–400)
RBC: 2.92 MIL/uL — ABNORMAL LOW (ref 3.87–5.11)
RDW: 16.2 % — ABNORMAL HIGH (ref 11.5–15.5)
WBC: 7.1 10*3/uL (ref 4.0–10.5)
nRBC: 0 % (ref 0.0–0.2)

## 2019-08-26 LAB — GLUCOSE, CAPILLARY
Glucose-Capillary: 149 mg/dL — ABNORMAL HIGH (ref 70–99)
Glucose-Capillary: 217 mg/dL — ABNORMAL HIGH (ref 70–99)
Glucose-Capillary: 147 mg/dL — ABNORMAL HIGH (ref 70–99)
Glucose-Capillary: 181 mg/dL — ABNORMAL HIGH (ref 70–99)

## 2019-08-26 LAB — PHOSPHORUS: Phosphorus: 4.8 mg/dL — ABNORMAL HIGH (ref 2.5–4.6)

## 2019-08-26 LAB — MAGNESIUM: Magnesium: 2.6 mg/dL — ABNORMAL HIGH (ref 1.7–2.4)

## 2019-08-26 MED ORDER — CHOLESTYRAMINE 4 G PO PACK
4.0000 g | PACK | Freq: Two times a day (BID) | ORAL | Status: DC
  Administered 2019-08-26 – 2019-08-28 (×5): 4 g via ORAL
  Filled 2019-08-26 (×7): qty 1

## 2019-08-26 NOTE — Progress Notes (Signed)
PROGRESS NOTE    Gina [name]  IOX:[id number] DOB: May 29, 1960 DOA: 08/17/2019 PCP: [name], MD   Chief Complaint  Patient presents with  . Fever    Brief Narrative:  Gina [name] a 59 y.o.femalewith medical history significant ofperforated prepyloric ulcerin 2020 s/pexlap with [name] patch, chronic TPN via right arm PICC, IDDM, bilateral lower quadrant drains along with enterocutaneous fistula with colostomy bag, Chronic sacral pressure ulcers stage II,HTN, schizophrenia, bilateral BKA, PVD s/p B/L BKA,sent from from home for evaluation of fever. She was admitted few times this year for sepsis, including MRSAbacteremia fungemia in [place] peritonitis in April andProteus UTIin May.Pt on TPN and she also take soft diet to improve her nutrition condition. Daughter noticed that pt has had decreased appetite for last 3-4 day and was warm to touch last night and complained about feeling of nausea but no vomiting. No abd pain. She also seemed to have burning sensation when urinated. Daughter reported that she does wound care everyday herself and did not see significant changes associated with the sacral wounds. Daughter also reported that pt Glucose has been fairly controlled at home seldom >200. ED Course:Cre1.4, CrCL 35 ml/min, Tmax 103.1, WBC WNL, LA 1.8. UA showed WBC 11-20/hpf. Xray no infiltrates. 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  Blood cultures from 08/17/2019 with 1/2+ for Candida.  Repeat blood cultures from 08/19/2019 initially with no growth to date however this morning are positive for yeast/Candida.  Patient noted to have been on TPN with a PICC line.  PICC line had been removed for line holiday pending repeat blood cultures.  CT abdomen and pelvis which was done with no significant change in size of fluid collection.  Patient seen by ophthalmology (08/19/2019) and endophthalmitis ruled out.  Continue IV Eraxis day #9/14.  2D echo which was done was negative for endocarditis.  Patient had PICC line placed back in on 08/21/2019 as blood cultures were negative at that time.  However repeat blood cultures from 08/19/2019 are positive on day 4, with yeast. PICC line subsequently removed.  Repeat blood cultures ordered per ID.  Once repeat blood cultures from 08/23/2019 are negative for 5 days per ID recommendations, PICC line may be placed back in.  Patient it was felt initially would need a total of 14 days of treatment per ID recommendations however due to persistent fungemia length of treatment may need to be increased.  Due to history of perforated gastric ulcer will likely hold off on requesting TEE..  ID following and I appreciate the input and recommendations.    2.  History of perforated gastric ulcer status post oversewing and Graham patch 01/2019/enterocutaneous fistula CT abdomen and pelvis with persistent fluid collection within ventral aspect of peritoneal cavity with continued fistula to skin surface at level of umbilicus, presumably ongoing enterocutaneous fistula, no significant change in size.  Per general surgical notes from 06/2019, appears developed enterocutaneous fistula postop from perforated  ulcer treated with oversewing and Graham patch.  Wound care following.  Patient received a dose of Questran with some improvement with watery stools.  Patient with watery stools this morning.  Place on Questran twice  daily.  Continue PPI.  3.  Diabetes mellitus type 2 Hemoglobin A1c 7.0.  CBG of 149 this morning.  Elevated blood glucose levels likely secondary to TPN.   Patient with repeat positive blood cultures and as such PICC line removed on 08/23/2019 and TPN held.  Lantus has been discontinued.  Continue sliding scale insulin.  Continue Neurontin.  Outpatient follow-up with PCP.   4.  Anemia/iron deficiency anemia of chronic illness Patient noted to have a drop in hemoglobin with no signs of overt bleeding felt likely dilutional.  Status post 2 units packed red blood cells hemoglobin has slowly been trending down and currently at 8.5 from 8.0 from 8.7 from 9.2 from 9.6 from 9.8 from 9.9 from 10.2 from 5.6.  Patient noted to be having brown stool however Hemoccult was positive.  As patient has had a continued drop in her hemoglobin GI was consulted and saw the patient in consultation on 08/25/2019 and recommended if any further drop in hemoglobin then will need a bleeding scan.  Continue PPI.  Status post IV Feraheme.  Continue oral iron supplementation.  Follow.    5.  Acute kidney injury Resolved with hydration.   6.  Proteus mirabilis UTI Patient on presentation had complaints of dysuria.  Urine cultures with > 100,000 colonies of Proteus mirabilis.  Clinical improvement.  Afebrile.  Status post 2 days IV cefepime and 1 dose of IV Rocephin.  No further antibiotics needed.  Discussed with ID.  7.  Hypertension Continue current regimen of Norvasc and Lopressor.  Follow.   8.  Stage II pressure ulcer, chronic, POA Patient has been assessed by wound care.  Continue current wound care.   Pressure Injury 05/22/19 Coccyx Bilateral Stage 4 - Full thickness tissue loss with exposed bone, tendon or muscle. Per wound nurse, healing stage IV from prior admission (Active)  05/22/19 2104  Location: Coccyx  Location Orientation: Bilateral  Staging: Stage 4 - Full thickness tissue loss with exposed bone, tendon or  muscle.  Wound Description (Comments): Per wound nurse, healing stage IV from prior admission  Present on Admission: Yes     Pressure Injury 05/22/19 Knee Right;Distal Unstageable - Full thickness tissue loss in which the base of the injury is covered by slough (yellow, tan, gray, green or brown) and/or eschar (tan, brown or black) in the wound bed. Stump (Active)  05/22/19 2106  Location: Knee  Location Orientation: Right;Distal  Staging: Unstageable - Full thickness tissue loss in which the base of the injury is covered by slough (yellow, tan, gray, green or brown) and/or eschar (tan, brown or black) in the wound bed.  Wound Description (Comments): Stump  Present on Admission: Yes     Pressure Injury 05/22/19 Knee Left;Distal Unstageable - Full thickness tissue loss in which the base of the injury is covered by slough (yellow, tan, gray, green or brown) and/or eschar (tan, brown or black) in the wound bed. Stump (Active)  05/22/19 2108  Location: Knee  Location Orientation: Left;Distal  Staging: Unstageable - Full thickness tissue loss in which the base of the injury is covered by slough (yellow, tan, gray, green or brown) and/or eschar (tan, brown or black) in the wound bed.  Wound Description (Comments): Stump  Present on Admission: Yes  Pressure Injury 05/22/19 Thigh Left;Mid Stage 2 -  Partial thickness loss of dermis presenting as a shallow open injury with a red, pink wound bed without slough. (Active)  05/22/19 2109  Location: Thigh  Location Orientation: Left;Mid  Staging: Stage 2 -  Partial thickness loss of dermis presenting as a shallow open injury with a red, pink wound bed without slough.  Wound Description (Comments):   Present on Admission: Yes     Pressure Injury 07/22/19 Sacrum Mid Stage 3 -  Full thickness tissue loss. Subcutaneous fat may be visible but bone, tendon or muscle are NOT exposed. (Active)  07/22/19   Location: Sacrum  Location Orientation: Mid    Staging: Stage 3 -  Full thickness tissue loss. Subcutaneous fat may be visible but bone, tendon or muscle are NOT exposed.  Wound Description (Comments):   Present on Admission: Yes     Pressure Injury 07/22/19 Leg Left Stage 3 -  Full thickness tissue loss. Subcutaneous fat may be visible but bone, tendon or muscle are NOT exposed. (Active)  07/22/19   Location: Leg  Location Orientation: Left  Staging: Stage 3 -  Full thickness tissue loss. Subcutaneous fat may be visible but bone, tendon or muscle are NOT exposed.  Wound Description (Comments):   Present on Admission: Yes     Pressure Injury 07/22/19 Leg Right Stage 3 -  Full thickness tissue loss. Subcutaneous fat may be visible but bone, tendon or muscle are NOT exposed. (Active)  07/22/19   Location: Leg  Location Orientation: Right  Staging: Stage 3 -  Full thickness tissue loss. Subcutaneous fat may be visible but bone, tendon or muscle are NOT exposed.  Wound Description (Comments):   Present on Admission: Yes        DVT prophylaxis: Lovenox on hold due to anemia and positive Hemoccult. Code Status: DNR Family Communication: Updated patient.  No family at bedside. Disposition:   Status is: Inpatient    Dispo: The patient is from: Home              Anticipated d/c is to: Home with home health.              Anticipated d/c date is: Hopefully in 5 to 6 days once repeat blood cultures are negative and when okay with ID.               Patient currently on IV Eraxis, Candida bacteremia, PICC line placed back in 08/21/2019 and TPN started.  Patient now with blood cultures coming back positive from 08/19/2019 and as such PICC line removed 08/23/2019.  Repeat blood cultures(08/23/2019) pending.  Not ready for discharge.         Consultants:   Ophthalmology: Dr. Ellie Lunch 08/19/2019  ID: Dr. Megan Salon 08/17/2019  Gastroenterology: Dr. Therisa Doyne 08/25/2019  Procedures:   CT abdomen and pelvis 08/18/2019  Chest x-ray 08/17/2019  2D  echo 08/18/2019  PICC line removal  2 units packed red blood cell transfusion 08/18/2019  PICC line 08/21/2019  PICC line removal 08/23/2019  Abdominal films pending 08/26/2019  Antimicrobials:  IV Eraxis 08/18/2019>>>>   IV cefepime 08/17/2019>>>> 08/18/2019  IV Rocephin 6/9/2021x1 dose   Subjective: Patient sleeping but arousable.  Denies any emesis.  No chest pain or shortness of breath.  Complains of left lower quadrant abdominal pain.  Asking for pain medication.   Objective: Vitals:   08/25/19 2120 08/25/19 2355 08/26/19 0400 08/26/19 0800  BP: (!) 137/57 (!) 110/52 (!) 106/54 118/85  Pulse:  65 60 (!) 56   Resp:  12 14 14   Temp:  97.6 F (36.4 C) 97.7 F (36.5 C) 97.9 F (36.6 C)  TempSrc:  Oral Oral Oral  SpO2:  98% 100% 93%  Weight:      Height:        Intake/Output Summary (Last 24 hours) at 08/26/2019 1054 Last data filed at 08/26/2019 0800 Gross per 24 hour  Intake 740 ml  Output 7100 ml  Net -6360 ml   Filed Weights   08/17/19 0900  Weight: 49.9 kg    Examination:  General exam: NAD Respiratory system: CTAB.  No wheezes, no crackles, no rhonchi.  Normal respiratory effort.  Cardiovascular system: RRR no murmurs rubs or gallops.  No JVD.  Gastrointestinal system: Abdomen is TTP in left lower quadrant.  Nontender, nondistended, soft, positive bowel sounds.  Ostomy with watery loose stools.   Central nervous system: Alert and oriented. No focal neurological deficits. Extremities: Status post bilateral BKA.  Skin: No rashes, lesions or ulcers Psychiatry: Judgement and insight appear normal. Mood & affect appropriate.     Data Reviewed: I have personally reviewed following labs and imaging studies  CBC: Recent Labs  Lab 08/21/19 0308 08/22/19 0640 08/24/19 1744 08/25/19 0605 08/26/19 0248  WBC 8.4 8.2 8.0 7.4 7.1  NEUTROABS 5.2 5.9 4.9 5.6 3.8  HGB 9.6* 9.2* 8.7* 8.0* 8.5*  HCT 29.7* 28.3* 28.1* 25.7* 26.9*  MCV 87.6 89.3 91.8 92.1 92.1  PLT 163  201 279 311 025    Basic Metabolic Panel: Recent Labs  Lab 08/22/19 0640 08/23/19 0243 08/24/19 1744 08/25/19 0605 08/26/19 0248  NA 130* 134* 135 134* 137  K 4.7 4.4 4.5 3.9 3.8  CL 103 105 108 105 110  CO2 20* 21* 19* 21* 20*  GLUCOSE 447* 234* 152* 196* 199*  BUN 40* 41* 40* 38* 35*  CREATININE 0.94 0.74 0.80 0.85 0.80  CALCIUM 8.4* 8.7* 8.8* 8.7* 9.0  MG 2.1 2.0 1.8 1.7 2.6*  PHOS 3.6 3.4 3.6 3.6 4.8*    GFR: Estimated Creatinine Clearance: 60.4 mL/min (by C-G formula based on SCr of 0.8 mg/dL).  Liver Function Tests: Recent Labs  Lab 08/20/19 0235 08/22/19 0640 08/25/19 0605  AST 22 55* 11*  ALT 21 41 22  ALKPHOS 75 266* 187*  BILITOT 0.7 1.7* 0.7  PROT 6.3* 6.4* 6.6  ALBUMIN 1.8* 1.8* 1.7*    CBG: Recent Labs  Lab 08/25/19 0612 08/25/19 1108 08/25/19 1553 08/25/19 2054 08/26/19 0625  GLUCAP 185* 147* 135* 176* 149*     Recent Results (from the past 240 hour(s))  SARS Coronavirus 2 by RT PCR (hospital order, performed in [facility] hospital lab) Nasopharyngeal Nasopharyngeal Swab     Status: None   Collection Time: 08/17/19  8:55 AM   Specimen: Nasopharyngeal Swab  Result Value Ref Range Status   SARS Coronavirus 2 NEGATIVE NEGATIVE Final    Comment: (NOTE) SARS-CoV-2 target nucleic acids are NOT DETECTED. The SARS-CoV-2 RNA is generally detectable in upper and lower respiratory specimens during the acute phase of infection. The lowest concentration of SARS-CoV-2 viral copies this assay can detect is 250 copies / mL. A negative result does not preclude SARS-CoV-2 infection and should not be used as the sole basis for treatment or other patient management decisions.  A negative result may occur with improper specimen collection / handling, submission of specimen other than nasopharyngeal swab, presence of viral mutation(s) within the areas targeted by this assay, and  inadequate number of viral copies (<250 copies / mL). A negative result must  be combined with clinical observations, patient history, and epidemiological information. Fact Sheet for Patients:   StrictlyIdeas.no Fact Sheet for Healthcare Providers: BankingDealers.co.za This test is not yet approved or cleared  by the [place] FDA and has been authorized for detection and/or diagnosis of SARS-CoV-2 by FDA under an Emergency Use Authorization (EUA).  This EUA will remain in effect (meaning this test can be used) for the duration of the COVID-19 declaration under Section 564(b)(1) of the Act, 21 U.S.C. section 360bbb-3(b)(1), unless the authorization is terminated or revoked sooner. Performed at [facility], [place] [address], [place], [place] [address]   Urine culture     Status: Abnormal   Collection Time: 08/17/19  9:05 AM   Specimen: In/Out Cath Urine  Result Value Ref Range Status   Specimen Description IN/OUT CATH URINE  Final   Special Requests   Final    NONE Performed at [facility], [place] [address] [place], [place] [address]    Culture >=100,000 COLONIES/mL PROTEUS MIRABILIS (A)  Final   Report Status 08/20/2019 FINAL  Final   Organism ID, Bacteria PROTEUS MIRABILIS (A)  Final      Susceptibility   Proteus mirabilis - MIC*    AMPICILLIN <=2 SENSITIVE Sensitive     CEFAZOLIN <=4 SENSITIVE Sensitive     CEFTRIAXONE <=1 SENSITIVE Sensitive     CIPROFLOXACIN >=4 RESISTANT Resistant     GENTAMICIN <=1 SENSITIVE Sensitive     IMIPENEM 2 SENSITIVE Sensitive     NITROFURANTOIN 128 RESISTANT Resistant     TRIMETH/SULFA >=320 RESISTANT Resistant     AMPICILLIN/SULBACTAM <=2 SENSITIVE Sensitive     PIP/TAZO <=4 SENSITIVE Sensitive     * >=100,000 COLONIES/mL PROTEUS MIRABILIS  Blood Culture (routine x 2)     Status: Abnormal   Collection Time: 08/17/19  9:50 AM   Specimen: BLOOD LEFT ARM  Result Value Ref Range Status   Specimen Description BLOOD LEFT ARM  Final   Special Requests   Final      BOTTLES DRAWN AEROBIC AND ANAEROBIC Blood Culture adequate volume   Culture  Setup Time   Final    BUDDING YEAST SEEN IN BOTH AEROBIC AND ANAEROBIC BOTTLES CRITICAL RESULT CALLED TO, READ BACK BY AND VERIFIED WITH: Park Breed [id number] FCP Performed at [facility], [address] [place],  [address]    Culture CANDIDA GLABRATA (A)  Final   Report Status 08/21/2019 FINAL  Final  Blood Culture ID Panel (Reflexed)     Status: Abnormal   Collection Time: 08/17/19  9:50 AM  Result Value Ref Range Status   Enterococcus species NOT DETECTED NOT DETECTED Final   Listeria monocytogenes NOT DETECTED NOT DETECTED Final   Staphylococcus species NOT DETECTED NOT DETECTED Final   Staphylococcus aureus (BCID) NOT DETECTED NOT DETECTED Final   Streptococcus species NOT DETECTED NOT DETECTED Final   Streptococcus agalactiae NOT DETECTED NOT DETECTED Final   Streptococcus pneumoniae NOT DETECTED NOT DETECTED Final   Streptococcus pyogenes NOT DETECTED NOT DETECTED Final   Acinetobacter baumannii NOT DETECTED NOT DETECTED Final   Enterobacteriaceae species NOT DETECTED NOT DETECTED Final   Enterobacter cloacae complex NOT DETECTED NOT DETECTED Final   Escherichia coli NOT DETECTED NOT DETECTED Final   Klebsiella oxytoca NOT DETECTED NOT DETECTED Final   Klebsiella pneumoniae NOT DETECTED NOT DETECTED Final   Proteus species NOT  DETECTED NOT DETECTED Final   Serratia marcescens NOT DETECTED NOT DETECTED Final   Haemophilus influenzae NOT DETECTED NOT DETECTED Final   Neisseria meningitidis NOT DETECTED NOT DETECTED Final   Pseudomonas aeruginosa NOT DETECTED NOT DETECTED Final   Candida albicans NOT DETECTED NOT DETECTED Final   Candida glabrata DETECTED (A) NOT DETECTED Final    Comment: CRITICAL RESULT CALLED TO, READ BACK BY AND VERIFIED WITH: PHARMD JEREMY F. [id number] FCP    Candida krusei NOT DETECTED NOT DETECTED Final   Candida parapsilosis NOT DETECTED NOT  DETECTED Final   Candida tropicalis NOT DETECTED NOT DETECTED Final    Comment: Performed at [facility], [place] [address], [place], [place] [address]  Blood Culture (routine x 2)     Status: Abnormal   Collection Time: 08/17/19  1:12 PM   Specimen: BLOOD LEFT ARM  Result Value Ref Range Status   Specimen Description BLOOD LEFT ARM  Final   Special Requests   Final    BOTTLES DRAWN AEROBIC ONLY Blood Culture adequate volume   Culture  Setup Time   Final    AEROBIC BOTTLE ONLY YEAST CRITICAL VALUE NOTED.  VALUE IS CONSISTENT WITH PREVIOUSLY REPORTED AND CALLED VALUE. Performed at [facility], [place] [address], [place], [place] [address]    Culture CANDIDA GLABRATA (A)  Final   Report Status 08/21/2019 FINAL  Final  MRSA PCR Screening     Status: Abnormal   Collection Time: 08/17/19  8:23 PM   Specimen: Nasal Mucosa; Nasopharyngeal  Result Value Ref Range Status   MRSA by PCR POSITIVE (A) NEGATIVE Final    Comment:        The GeneXpert MRSA Assay (FDA approved for NASAL specimens only), is one component of a comprehensive MRSA colonization surveillance program. It is not intended to diagnose MRSA infection nor to guide or monitor treatment for MRSA infections. RESULT CALLED TO, READ BACK BY AND VERIFIED WITH: J. MILLER,CHARGE RN [id number] 08/17/2019 Mena Goes Performed at [facility], [place] [address] [place], [place] [address]   Culture, blood (routine x 2)     Status: Abnormal   Collection Time: 08/19/19  5:56 AM   Specimen: BLOOD LEFT HAND  Result Value Ref Range Status   Specimen Description BLOOD LEFT HAND  Final   Special Requests   Final    BOTTLES DRAWN AEROBIC AND ANAEROBIC Blood Culture adequate volume   Culture  Setup Time   Final    ANAEROBIC BOTTLE ONLY YEAST CRITICAL VALUE NOTED.  VALUE IS CONSISTENT WITH PREVIOUSLY REPORTED AND CALLED VALUE. Performed at [facility], [place] [address], [place], [place] [address]    Culture CANDIDA GLABRATA  (A)  Final   Report Status 08/26/2019 FINAL  Final  Culture, blood (routine x 2)     Status: Abnormal   Collection Time: 08/19/19  5:56 AM   Specimen: BLOOD LEFT HAND  Result Value Ref Range Status   Specimen Description BLOOD LEFT HAND  Final   Special Requests   Final    BOTTLES DRAWN AEROBIC AND ANAEROBIC Blood Culture adequate volume   Culture  Setup Time (A)  Final    YEAST AEROBIC BOTTLE ONLY CRITICAL RESULT CALLED TO, READ BACK BY AND VERIFIED WITH: PHARMD Y PATEL [id number] AT 23 AM BY CM Performed at [facility], [place] [address] [place], [place] [address]    Culture CANDIDA GLABRATA (A)  Final   Report Status  08/24/2019 FINAL  Final  Culture, blood (routine x 2)     Status: None (Preliminary result)   Collection Time: 08/23/19 11:17 AM   Specimen: BLOOD LEFT FOREARM  Result Value Ref Range Status   Specimen Description BLOOD LEFT FOREARM  Final   Special Requests   Final    BOTTLES DRAWN AEROBIC ONLY Blood Culture adequate volume   Culture   Final    NO GROWTH 2 DAYS Performed at [facility], [place] [address], [place], [place] [address]    Report Status PENDING  Incomplete  Culture, blood (routine x 2)     Status: None (Preliminary result)   Collection Time: 08/23/19 11:22 AM   Specimen: BLOOD LEFT FOREARM  Result Value Ref Range Status   Specimen Description BLOOD LEFT FOREARM  Final   Special Requests   Final    BOTTLES DRAWN AEROBIC ONLY Blood Culture adequate volume   Culture   Final    NO GROWTH 2 DAYS Performed at [facility], [place] [address], [place], [place] [address]    Report Status PENDING  Incomplete         Radiology Studies: No results found.      Scheduled Meds: . amLODipine  5 mg Oral Daily  . ascorbic acid  500 mg Oral BID  . Chlorhexidine Gluconate Cloth  6 each Topical Daily  . escitalopram  20 mg Oral Daily  . feeding supplement  1 Container Oral TID BM  . ferrous sulfate  300 mg Oral Q breakfast  . gabapentin   300 mg Oral TID  . insulin aspart  0-9 Units Subcutaneous TID WC  . melatonin  3 mg Oral QHS  . metoprolol tartrate  50 mg Oral BID  . pantoprazole  40 mg Oral BID  . sodium chloride flush  10-40 mL Intracatheter Q12H   Continuous Infusions: . anidulafungin 78 mL/hr at 08/25/19 1800     LOS: 9 days    Time spent: 35 minutes    Irine Seal, MD [facility]   To contact the attending provider between 7A-7P or the covering provider during after hours 7P-7A, please log into the web site [email or web address] and access using universal [place] password for that web site. If you do not have the password, please call the hospital operator.  08/26/2019, 10:54 AM

## 2019-08-26 NOTE — Progress Notes (Signed)
[facility] Gastroenterology Progress Note  Gina [name] 59 y.o. [birth year]  CC:  Anemia, heme-positive stools  Subjective: Patient reports abdominal pain today.  Unable to localize the pain.  States she doesn't feel like eating.   Per RN, patient passed a small, brown stool per rectum today without melena or hematochezia.  ROS : Review of Systems  Cardiovascular: Negative for chest pain and palpitations.  Gastrointestinal: Positive for abdominal pain. Negative for blood in stool, constipation, diarrhea, heartburn, melena, nausea and vomiting.    Objective: Vital signs in last 24 hours: Vitals:   08/26/19 0400 08/26/19 0800  BP: (!) 106/54 118/85  Pulse: (!) 56   Resp: 14 14  Temp: 97.7 F (36.5 C) 97.9 F (36.6 C)  SpO2: 100% 93%    Physical Exam:  General:  Lethargic, oriented, cooperative, no distress  Head:  Normocephalic, without obvious abnormality, atraumatic  Eyes:  Anicteric sclera, EOMs intact  Lungs:   Clear to auscultation bilaterally, respirations unlabored  Heart:  Regular rate and rhythm, S1/S2 normal  Abdomen:   Soft, mild diffuse tenderness, nondistended, no guarding or peritoneal signs, normoactive bowel sounds, bag with clear yellow liquid   Extremities: Extremities normal, atraumatic, no  edema  Pulses: 2+ and symmetric    Lab Results: Recent Labs    08/25/19 0605 08/26/19 0248  NA 134* 137  K 3.9 3.8  CL 105 110  CO2 21* 20*  GLUCOSE 196* 199*  BUN 38* 35*  CREATININE 0.85 0.80  CALCIUM 8.7* 9.0  MG 1.7 2.6*  PHOS 3.6 4.8*   Recent Labs    08/25/19 0605  AST 11*  ALT 22  ALKPHOS 187*  BILITOT 0.7  PROT 6.6  ALBUMIN 1.7*   Recent Labs    08/25/19 0605 08/26/19 0248  WBC 7.4 7.1  NEUTROABS 5.6 3.8  HGB 8.0* 8.5*  HCT 25.7* 26.9*  MCV 92.1 92.1  PLT 311 333   No results for input(s): LABPROT, INR in the last 72 hours.  Impression: Iron-deficiency anemia, heme-positive stools -Hgb 8.5 today as compared to 8.0  yesterday -Elevated BUN:Cr; however, patient has CKD and has persistent elevations in BUN per chart review.  BUN is trending down, now at 35 -FOBT positive on 6/7  -Iron decreased to 10 as of 6/7 but ferritin is elevated  History of perforated ulcer with enterocutaneous fistula (as per prior CT scan, the nidus is in the small bowel), bag in place.  Chronic abdominal pain. -Preparation of colon will be challenging and may not reach colon due to enterocutaneous fistula.  Plan: Since patient had an EGD 07/25/19, the yield of a repeat EGD at this time is low, especially in the absence of melenic stools.  Furthermore, patient's BUN is trending down.  If further drop in Hemoglobin, recommend bleeding scan.  Would benefit from colonoscopy in the future, though patient declines colonoscopy at the present time, and prep may not reach the colon, as most of the bowel secretions seem to come out from the enterocutaneous fistula into the bag.  No plans for endoscopic intervention at this time.  No signs of GI bleeding.  [facility] will sign off.  Please contact us if we can be of any further assistance during this hospital stay.   [name] PA-C 08/26/2019, 12:06 PM  Contact #  [phone]

## 2019-08-26 NOTE — Plan of Care (Signed)

## 2019-08-26 NOTE — Progress Notes (Signed)
.  PHARMACY - TOTAL PARENTERAL NUTRITION CONSULT NOTE   Indication: Fistula  Patient Measurements: Height: _0  (157.5 cm) Weight: 49.9 kg (110 lb) IBW/kg (Calculated) : 50.1 TPN AdjBW (KG): 49.9 Body mass index is 20.12 kg/m. Usual Weight: 53-60 kg  Assessment: 59 yo female presents on 08/17/2019 with fever, dysuria, and decreased intake. On presentation, patient found to have blood glucose of > 600 and started on insulin gtt. Prior to admission patient is on chronic cyclic TPN via R arm PICC and soft diet. TPN was initiated in 02/2019 after perforated prepyloric ulcer which was repaired with a [name] patch and continued to leak. Patient currently has an Chief Strategy Officer in place from January 2021 due to [facility] fistula.   Per patient, decreased appetite and po intake for previous 3-4 days prior to presentation. She reports transitioning from a normal diet of foods such as grits, chicken, tuna, chicken noodle soup, chips, jello, and pudding, to full liquid diet of mainly soup. Per outpatient records, she is on a 14-hour TPN cycle.   TPN was held on admission until 6/10 due to candidemia which is being treated with anidulafungin. PICC line was pulled and reinserted 6/10. Pharmacy consulted to resume TPN on 08/21/19 then blood culture from 6/8 grew 2/4 c. Glabrata and PICC was removed on 6/12 again and TPN held. Blood cultures obtained on 6/12 currently no growth to date.    Glucose / Insulin: Hx of DM (controlled by diet outpt). Hyperglycemia on admission, started on insulin gtt, then stable for days. CBGs improved but remain uncontrolled back on continuous TPN. Now Lantus d/c'd 6/12 with TPN held again and CBGs controlled off TPN. 3 units/24 hours.  Electrolytes: Na 137. Corrected Ca 10.4. CO2 21. Mg 2.6, Phos 4.8  Renal: Scr stable at 0.8, baseline Scr 0.6-0.8. LFTs / TGs: Alk phos 187 - decrease. AST/ ALT wnl. Tbili 0.7 - decrease. TG 252. Prealbumin / albumin: prealbumin 9.4, albumin 1.7.  Intake /  Output; MIVF: no MIVF GI Imaging: 6/7 CT abd - ongoing enterocutaneous fistula, no significant change in size of fluid collection despite removng perc drainage catheter Surgeries / Procedures:   Central access: PICC removed 6/12 TPN start date: Chronic TPN prior to admission since 02/2019; TPN resumed on 6/10-6/12  Nutritional Goals (per outpatient records): kCal: 1471 kcal, Protein: 93 g, Fluid: 1800 ml Goal TPN rate is 75 mL/hr (provides 90g of protein and 1476 kcals per day)  RD goals per note 6/11: KCal: 1500-1700, Protein: 65-80 g, Fluid: >/=1.5L  Current Nutrition:  Boost Breeze PO TID Soft diet Resumed home 16-XW cyclic TPN on 9/60, back to continuous 24-hr cycle 6/11; now TPN held again 6/12  Plan:  Continue to hold TPN for now due to PICC line removal 6/12 for persistently positive BCx. Per ID, if they remain negative overnight, can have PICC placed tomorrow  Follow up blood cultures and plans to place PICC and resume TPN Continue sSSI and adjust as needed Per Dr. Grandville Silos, will monitor but hold off on hanging D10 or D5 for now   Albertina Parr, PharmD., BCPS, BCCCP Clinical Pharmacist Clinical phone for 08/26/19 until 3:30pm: [phone] If after 3:30pm, please refer to [facility] for unit-specific pharmacist

## 2019-08-26 NOTE — Progress Notes (Signed)
         Gina [name] for Infectious Disease    Date of Admission:  08/17/2019   Day 9 anidulafungin         Gina [name] remains clinically stable and afebrile on therapy for candidemia.  Repeat blood cultures are negative at 72 hours.  If they remain negative overnight she can have her PICC replaced.  She needs to continue anidulafungin for 2 weeks after blood cultures become negative.        Michel Bickers, MD [facility] for Infectious [facility] [phone] pager   [phone] cell 08/26/2019, 10:59 AM

## 2019-08-27 ENCOUNTER — Inpatient Hospital Stay: Payer: Self-pay

## 2019-08-27 LAB — GLUCOSE, CAPILLARY
Glucose-Capillary: 147 mg/dL — ABNORMAL HIGH (ref 70–99)
Glucose-Capillary: 168 mg/dL — ABNORMAL HIGH (ref 70–99)
Glucose-Capillary: 227 mg/dL — ABNORMAL HIGH (ref 70–99)
Glucose-Capillary: 207 mg/dL — ABNORMAL HIGH (ref 70–99)
Glucose-Capillary: 291 mg/dL — ABNORMAL HIGH (ref 70–99)

## 2019-08-27 LAB — CBC
HCT: 27.1 % — ABNORMAL LOW (ref 36.0–46.0)
Hemoglobin: 8.5 g/dL — ABNORMAL LOW (ref 12.0–15.0)
MCH: 28.9 pg (ref 26.0–34.0)
MCHC: 31.4 g/dL (ref 30.0–36.0)
MCV: 92.2 fL (ref 80.0–100.0)
Platelets: 350 10*3/uL (ref 150–400)
RBC: 2.94 MIL/uL — ABNORMAL LOW (ref 3.87–5.11)
RDW: 16.7 % — ABNORMAL HIGH (ref 11.5–15.5)
WBC: 7.2 10*3/uL (ref 4.0–10.5)
nRBC: 0 % (ref 0.0–0.2)

## 2019-08-27 LAB — BASIC METABOLIC PANEL
Anion gap: 8 (ref 5–15)
BUN: 35 mg/dL — ABNORMAL HIGH (ref 6–20)
CO2: 18 mmol/L — ABNORMAL LOW (ref 22–32)
Calcium: 9 mg/dL (ref 8.9–10.3)
Chloride: 114 mmol/L — ABNORMAL HIGH (ref 98–111)
Creatinine, Ser: 1.08 mg/dL — ABNORMAL HIGH (ref 0.44–1.00)
GFR calc Af Amer: >60 mL/min (ref >60)
GFR calc non Af Amer: 57 mL/min — ABNORMAL LOW (ref >60)
Glucose, Bld: 178 mg/dL — ABNORMAL HIGH (ref 70–99)
Potassium: 3.8 mmol/L (ref 3.5–5.1)
Sodium: 140 mmol/L (ref 135–145)

## 2019-08-27 LAB — MAGNESIUM: Magnesium: 2.4 mg/dL (ref 1.7–2.4)

## 2019-08-27 LAB — PHOSPHORUS: Phosphorus: 5.6 mg/dL — ABNORMAL HIGH (ref 2.5–4.6)

## 2019-08-27 MED ORDER — SODIUM CHLORIDE 0.9% FLUSH
10.0000 mL | Freq: Two times a day (BID) | INTRAVENOUS | Status: DC
  Administered 2019-08-28 – 2019-08-29 (×2): 10 mL

## 2019-08-27 MED ORDER — SODIUM CHLORIDE 0.9% FLUSH: 10.0000 mL | INTRAVENOUS | Status: DC | PRN

## 2019-08-27 MED ORDER — TRAVASOL 10 % IV SOLN
INTRAVENOUS | Status: AC
  Filled 2019-08-27: qty 810

## 2019-08-27 MED ORDER — INSULIN ASPART 100 UNIT/ML ~~LOC~~ SOLN
0.0000 [IU] | SUBCUTANEOUS | Status: DC
  Administered 2019-08-27: 5 [IU] via SUBCUTANEOUS
  Administered 2019-08-28: 1 [IU] via SUBCUTANEOUS
  Administered 2019-08-28: 2 [IU] via SUBCUTANEOUS
  Administered 2019-08-28: 3 [IU] via SUBCUTANEOUS
  Administered 2019-08-28: 1 [IU] via SUBCUTANEOUS
  Administered 2019-08-28 – 2019-08-29 (×4): 2 [IU] via SUBCUTANEOUS
  Administered 2019-08-29: 1 [IU] via SUBCUTANEOUS
  Administered 2019-08-29: 2 [IU] via SUBCUTANEOUS
  Administered 2019-08-29: 3 [IU] via SUBCUTANEOUS

## 2019-08-27 NOTE — Progress Notes (Signed)
Patient's youngest sister is concerned for patient to be discharged to home, states she lives alone and does not live with daughter or son--she is mostly at home by herself with son/daughter rarely coming to help--I have sent text page to debbie/RN and Dr Tyrell Antonio, will pass on in report to make sure social work/home health is established (or whatever arrangements need to be made) for patient to have before discharge

## 2019-08-27 NOTE — Progress Notes (Signed)
PROGRESS NOTE    Gina [name]  WUJ:[id number] DOB: 11/27/1960 DOA: 08/17/2019 PCP: [name], MD   Brief Narrative: 59 year old with past medical history significant for perforated prepyloric ulcer in 2020 status oratory laparotomy with [name] patch, chronic TPN via right arm PICC line, insulin-dependent diabetes, enterocutaneous fistula with colostomy bag, chronic sacral pressure ulcer stage II, hypertension, schizophrenia, bilateral BKA, PVD status post bilateral BKA sent from home for evaluation of fever.  She was admitted a few times this year for sepsis, including MRSA bacteremia, fungemia in March, Candida peritonitis in April and Proteus UTI in May.  Patient on TPN and she also takes soft diet to improve her nutrition status.  Daughter noticed that patient has had decreased appetite for the last 3 to 4 days prior to admission and was febrile.  She she was complaining of dysuria.  Per daughter when appears to be stable. She was admitted with fungemia.   Assessment & Plan:   Principal Problem:   Candidemia ([place]) Active Problems:   HTN (hypertension)   Diabetes ([place])   Anemia of chronic disease   Schizophrenia ([place])   Acute kidney injury ([place])   Sacral decubitus ulcer   Normocytic anemia   S/P BKA (below knee amputation) bilateral ([facility])   Status post colostomy ([place])   Enterocutaneous fistula   Urinary tract infection due to Proteus   Iron deficiency anemia   1-Sepsis secondary to Candida glabrata fungemia, POA; Patient presented with fever, normal white count, tachycardia.  Blood cultures from 08/17/2019 1 out of 2 positive for Candida. Repeated blood cultures on 08/19/2019 continue to growth Candida. PICC line was removed, patient had a PICC line holiday cough. Repeated blood cultures on 6/12; no growth for the last 3 days. CT abdomen and pelvis show no significant changes in size fluid collection. Patient was also evaluated by ophthalmology on 08/19/2019 and endophthalmitis  was ruled out. She was a started on Eraxis. Per ID if blood cultures continue to remain negative today okay to proceed with PICC line placement. She will need to complete 2 weeks from 6/12 of Eraxis.   2-History of perforated gastric ulcer status post oversewing and Grahan patch on 01/2019/enterocutaneous fistula: -CT abdomen and pelvis with persistent fluid collection within the ventral aspect of peritoneal cavity with continued fistula to the skin surface at level of umbilicus, presumably ongoing enterocutaneous fistula, no significant change in size.  Per general surgery note from 06/2019, appears developed enterocutaneous fistula postop from perforated ulcer treated with oversewing and [name] patch. Continue with Questran for watery stool  3-Diabetes mellitus type 2: hb A1c 7.0 Continue with a sliding scale Might need  to resume Lantus when back on TPN  4-anemia/iron deficiency anemia of chronic disease: Patient noted to have a drop in hemoglobin no signs of overt bleeding.  Felt to be delusional. Received 2 unit of packed red blood cell. GI was consulted, they recommend that if patient hemoglobin dropped again patient will need a bleeding scan Continue with PPI Received IV Feraheme  5-acute kidney injury: Resolved with IV fluids 6-Proteus mirabilis UTI; Urine culture grew more than 100 K Proteus mirabilis Of 3 days of IV antibiotics  7-hypertension: Continue with Norvasc and Lopressor 8-stage II pressure ulcer POA coccyx: Continue with local care  Pressure Injury 05/22/19 Coccyx Bilateral Stage 4 - Full thickness tissue loss with exposed bone, tendon or muscle. Per wound nurse, healing stage IV from prior admission (Active)  05/22/19 2104  Location: Coccyx  Location Orientation: Bilateral  Staging: Stage 4 - Full thickness tissue loss with exposed bone, tendon or muscle.  Wound Description (Comments): Per wound nurse, healing stage IV from prior admission  Present on  Admission: Yes     Pressure Injury 05/22/19 Knee Right;Distal Unstageable - Full thickness tissue loss in which the base of the injury is covered by slough (yellow, tan, gray, green or brown) and/or eschar (tan, brown or black) in the wound bed. Stump (Active)  05/22/19 2106  Location: Knee  Location Orientation: Right;Distal  Staging: Unstageable - Full thickness tissue loss in which the base of the injury is covered by slough (yellow, tan, gray, green or brown) and/or eschar (tan, brown or black) in the wound bed.  Wound Description (Comments): Stump  Present on Admission: Yes     Pressure Injury 05/22/19 Knee Left;Distal Unstageable - Full thickness tissue loss in which the base of the injury is covered by slough (yellow, tan, gray, green or brown) and/or eschar (tan, brown or black) in the wound bed. Stump (Active)  05/22/19 2108  Location: Knee  Location Orientation: Left;Distal  Staging: Unstageable - Full thickness tissue loss in which the base of the injury is covered by slough (yellow, tan, gray, green or brown) and/or eschar (tan, brown or black) in the wound bed.  Wound Description (Comments): Stump  Present on Admission: Yes     Pressure Injury 05/22/19 Thigh Left;Mid Stage 2 -  Partial thickness loss of dermis presenting as a shallow open injury with a red, pink wound bed without slough. (Active)  05/22/19 2109  Location: Thigh  Location Orientation: Left;Mid  Staging: Stage 2 -  Partial thickness loss of dermis presenting as a shallow open injury with a red, pink wound bed without slough.  Wound Description (Comments):   Present on Admission: Yes     Pressure Injury 07/22/19 Sacrum Mid Stage 3 -  Full thickness tissue loss. Subcutaneous fat may be visible but bone, tendon or muscle are NOT exposed. (Active)  07/22/19   Location: Sacrum  Location Orientation: Mid  Staging: Stage 3 -  Full thickness tissue loss. Subcutaneous fat may be visible but bone, tendon or muscle are  NOT exposed.  Wound Description (Comments):   Present on Admission: Yes     Pressure Injury 07/22/19 Leg Left Stage 3 -  Full thickness tissue loss. Subcutaneous fat may be visible but bone, tendon or muscle are NOT exposed. (Active)  07/22/19   Location: Leg  Location Orientation: Left  Staging: Stage 3 -  Full thickness tissue loss. Subcutaneous fat may be visible but bone, tendon or muscle are NOT exposed.  Wound Description (Comments):   Present on Admission: Yes     Pressure Injury 07/22/19 Leg Right Stage 3 -  Full thickness tissue loss. Subcutaneous fat may be visible but bone, tendon or muscle are NOT exposed. (Active)  07/22/19   Location: Leg  Location Orientation: Right  Staging: Stage 3 -  Full thickness tissue loss. Subcutaneous fat may be visible but bone, tendon or muscle are NOT exposed.  Wound Description (Comments):   Present on Admission: Yes     Nutrition Problem: Moderate Malnutrition Etiology: chronic illness (EC fistula, PVD, perforated prepyloric ulcer s/p ex-lap on chronic cyclic TPN)    Signs/Symptoms: severe fat depletion, moderate muscle depletion    Interventions: Boost Breeze, TPN  Estimated body mass index is 20.12 kg/m as calculated from the following:   Height as of this encounter: 5\' 2"  (1.575 m).   Weight as  of this encounter: 49.9 kg.   DVT prophylaxis: SCDs  Code Status: DNR Family Communication: Care discussed with patient Disposition Plan:  Status is: Inpatient  Remains inpatient appropriate because:IV treatments appropriate due to intensity of illness or inability to take PO   Dispo: The patient is from: Home              Anticipated d/c is to: Home              Anticipated d/c date is: 1 day              Patient currently is not medically stable to d/c.  Plan to proceed with PICC line placement today, initiate TPN, arrange home antibiotic infusion.  Goal to be discharged 6//17        Consultants:   Ophthalmology:  Dr. Ellie Lunch 08/19/2019  ID: Dr. Megan Salon 08/17/2019  Gastroenterology: Dr. Therisa Doyne 08/25/2019   Procedures:   CT abdomen and pelvis 08/18/2019  Chest x-ray 08/17/2019  2D echo 08/18/2019  PICC line removal  2 units packed red blood cell transfusion 08/18/2019  PICC line 08/21/2019  PICC line removal 08/23/2019  Abdominal films pending 08/26/2019  Antimicrobials:   IV Eraxis 08/18/2019>>>>   IV cefepime 08/17/2019>>>> 08/18/2019  IV Rocephin 6/9/2021x1 dose  Subjective: She is alert, denies worsening abdominal pain.   Objective: Vitals:   08/27/19 0300 08/27/19 0423 08/27/19 0734 08/27/19 1029  BP:   (!) 92/53 114/62  Pulse:  62 64 68  Resp: 20 16 15 12   Temp:  98 F (36.7 C) 97.8 F (36.6 C) 97.7 F (36.5 C)  TempSrc:  Oral Oral Oral  SpO2:  96% 97% 97%  Weight:      Height:        Intake/Output Summary (Last 24 hours) at 08/27/2019 1255 Last data filed at 08/27/2019 0900 Gross per 24 hour  Intake 610 ml  Output 4425 ml  Net -3815 ml   Filed Weights   08/17/19 0900  Weight: 49.9 kg    Examination:  General exam: Appears calm and comfortable  Respiratory system: Clear to auscultation. Respiratory effort normal. Cardiovascular system: S1 & S2 heard, RRR. No JVD, murmurs, rubs, gallops or clicks. No pedal edema. Gastrointestinal system: Abdomen is nondistended, soft and nontender. No organomegaly or masses felt. Normal bowel sounds heard. enterocutaneous fistula, ostomy bag.  Central nervous system: Alert and oriented. Follows command Extremities: no edema  Data Reviewed: I have personally reviewed following labs and imaging studies  CBC: Recent Labs  Lab 08/21/19 0308 08/21/19 0308 08/22/19 0640 08/24/19 1744 08/25/19 0605 08/26/19 0248 08/27/19 0321  WBC 8.4   < > 8.2 8.0 7.4 7.1 7.2  NEUTROABS 5.2  --  5.9 4.9 5.6 3.8  --   HGB 9.6*   < > 9.2* 8.7* 8.0* 8.5* 8.5*  HCT 29.7*   < > 28.3* 28.1* 25.7* 26.9* 27.1*  MCV 87.6   < > 89.3 91.8 92.1 92.1 92.2    PLT 163   < > 201 279 311 333 350   < > = values in this interval not displayed.   Basic Metabolic Panel: Recent Labs  Lab 08/23/19 0243 08/24/19 1744 08/25/19 0605 08/26/19 0248 08/27/19 0321  NA 134* 135 134* 137 140  K 4.4 4.5 3.9 3.8 3.8  CL 105 108 105 110 114*  CO2 21* 19* 21* 20* 18*  GLUCOSE 234* 152* 196* 199* 178*  BUN 41* 40* 38* 35* 35*  CREATININE 0.74 0.80 0.85 0.80 1.08*  CALCIUM 8.7* 8.8* 8.7* 9.0 9.0  MG 2.0 1.8 1.7 2.6* 2.4  PHOS 3.4 3.6 3.6 4.8* 5.6*   GFR: Estimated Creatinine Clearance: 44.7 mL/min (A) (by C-G formula based on SCr of 1.08 mg/dL (H)). Liver Function Tests: Recent Labs  Lab 08/22/19 0640 08/25/19 0605  AST 55* 11*  ALT 41 22  ALKPHOS 266* 187*  BILITOT 1.7* 0.7  PROT 6.4* 6.6  ALBUMIN 1.8* 1.7*   No results for input(s): LIPASE, AMYLASE in the last 168 hours. No results for input(s): AMMONIA in the last 168 hours. Coagulation Profile: No results for input(s): INR, PROTIME in the last 168 hours. Cardiac Enzymes: No results for input(s): CKTOTAL, CKMB, CKMBINDEX, TROPONINI in the last 168 hours. BNP (last 3 results) No results for input(s): PROBNP in the last 8760 hours. HbA1C: No results for input(s): HGBA1C in the last 72 hours. CBG: Recent Labs  Lab 08/26/19 1636 08/26/19 2111 08/27/19 0628 08/27/19 0740 08/27/19 1117  GLUCAP 147* 181* 147* 168* 227*   Lipid Profile: Recent Labs    08/25/19 0605  TRIG 252*   Thyroid Function Tests: No results for input(s): TSH, T4TOTAL, FREET4, T3FREE, THYROIDAB in the last 72 hours. Anemia Panel: No results for input(s): VITAMINB12, FOLATE, FERRITIN, TIBC, IRON, RETICCTPCT in the last 72 hours. Sepsis Labs: No results for input(s): PROCALCITON, LATICACIDVEN in the last 168 hours.  Recent Results (from the past 240 hour(s))  Blood Culture (routine x 2)     Status: Abnormal   Collection Time: 08/17/19  1:12 PM   Specimen: BLOOD LEFT ARM  Result Value Ref Range Status    Specimen Description BLOOD LEFT ARM  Final   Special Requests   Final    BOTTLES DRAWN AEROBIC ONLY Blood Culture adequate volume   Culture  Setup Time   Final    AEROBIC BOTTLE ONLY YEAST CRITICAL VALUE NOTED.  VALUE IS CONSISTENT WITH PREVIOUSLY REPORTED AND CALLED VALUE. Performed at [facility], [place] [address], [place], [place] [address]    Culture CANDIDA GLABRATA (A)  Final   Report Status 08/21/2019 FINAL  Final  MRSA PCR Screening     Status: Abnormal   Collection Time: 08/17/19  8:23 PM   Specimen: Nasal Mucosa; Nasopharyngeal  Result Value Ref Range Status   MRSA by PCR POSITIVE (A) NEGATIVE Final    Comment:        The GeneXpert MRSA Assay (FDA approved for NASAL specimens only), is one component of a comprehensive MRSA colonization surveillance program. It is not intended to diagnose MRSA infection nor to guide or monitor treatment for MRSA infections. RESULT CALLED TO, READ BACK BY AND VERIFIED WITH: J. MILLER,CHARGE RN [id number] 08/17/2019 Mena Goes Performed at [facility], [place] [address], [place], [place] [address]   Culture, blood (routine x 2)     Status: Abnormal   Collection Time: 08/19/19  5:56 AM   Specimen: BLOOD LEFT HAND  Result Value Ref Range Status   Specimen Description BLOOD LEFT HAND  Final   Special Requests   Final    BOTTLES DRAWN AEROBIC AND ANAEROBIC Blood Culture adequate volume   Culture  Setup Time   Final    ANAEROBIC BOTTLE ONLY YEAST CRITICAL VALUE NOTED.  VALUE IS CONSISTENT WITH PREVIOUSLY REPORTED AND CALLED VALUE. Performed at [facility], [place] [address], [place], [place] [address]    Culture CANDIDA GLABRATA (A)  Final   Report Status 08/26/2019 FINAL  Final  Culture,  blood (routine x 2)     Status: Abnormal   Collection Time: 08/19/19  5:56 AM   Specimen: BLOOD LEFT HAND  Result Value Ref Range Status   Specimen Description BLOOD LEFT HAND  Final   Special Requests   Final    BOTTLES DRAWN AEROBIC AND  ANAEROBIC Blood Culture adequate volume   Culture  Setup Time (A)  Final    YEAST AEROBIC BOTTLE ONLY CRITICAL RESULT CALLED TO, READ BACK BY AND VERIFIED WITH: PHARMD Y PATEL [id number] AT 1 AM BY CM Performed at [facility], [place] [address] [place], [place] [address]    Culture CANDIDA GLABRATA (A)  Final   Report Status 08/24/2019 FINAL  Final  Culture, blood (routine x 2)     Status: None (Preliminary result)   Collection Time: 08/23/19 11:17 AM   Specimen: BLOOD LEFT FOREARM  Result Value Ref Range Status   Specimen Description BLOOD LEFT FOREARM  Final   Special Requests   Final    BOTTLES DRAWN AEROBIC ONLY Blood Culture adequate volume   Culture   Final    NO GROWTH 4 DAYS Performed at [facility], [place] [address] [place], [place] [address]    Report Status PENDING  Incomplete  Culture, blood (routine x 2)     Status: None (Preliminary result)   Collection Time: 08/23/19 11:22 AM   Specimen: BLOOD LEFT FOREARM  Result Value Ref Range Status   Specimen Description BLOOD LEFT FOREARM  Final   Special Requests   Final    BOTTLES DRAWN AEROBIC ONLY Blood Culture adequate volume   Culture   Final    NO GROWTH 4 DAYS Performed at [facility], [place] [address], [place], [place] [address]    Report Status PENDING  Incomplete         Radiology Studies: DG Abd Portable 2V  Result Date: 08/26/2019 CLINICAL DATA:  Generalized abdominal pain. EXAM: PORTABLE ABDOMEN - 2 VIEW COMPARISON:  CT 08/18/2019. FINDINGS: EKG leads noted. Catheter noted over pelvis. Bibasilar atelectasis. No bowel distention or free air. Stool noted throughout colon. Degenerative changes scoliosis thoracolumbar spine. Degenerative changes both hips. No acute bony abnormality identified. IMPRESSION: 1.  Bibasilar atelectasis. 2. No acute intra-abdominal abnormality identified. No bowel distention. Stool noted throughout the colon. Electronically Signed   By: Marcello Moores  Register   On: 08/26/2019  11:57   [place] EKG SITE RITE  Result Date: 08/27/2019 If Site Rite image not attached, placement could not be confirmed due to current cardiac rhythm.       Scheduled Meds: . amLODipine  5 mg Oral Daily  . ascorbic acid  500 mg Oral BID  . Chlorhexidine Gluconate Cloth  6 each Topical Daily  . cholestyramine  4 g Oral BID  . escitalopram  20 mg Oral Daily  . feeding supplement  1 Container Oral TID BM  . ferrous sulfate  300 mg Oral Q breakfast  . gabapentin  300 mg Oral TID  . insulin aspart  0-9 Units Subcutaneous Q4H  . melatonin  3 mg Oral QHS  . metoprolol tartrate  50 mg Oral BID  . pantoprazole  40 mg Oral BID  . sodium chloride flush  10-40 mL Intracatheter Q12H   Continuous Infusions: . anidulafungin 100 mg (08/27/19 1245)  . TPN ADULT (ION)       LOS: 10 days    Time spent: 35 minutes.     Elmarie Shiley, MD  [facility]   If 7PM-7AM, please contact night-coverage [email or web address]  08/27/2019, 12:55 PM

## 2019-08-27 NOTE — Progress Notes (Signed)
[facility] - TOTAL PARENTERAL NUTRITION CONSULT NOTE   Indication: Fistula  Patient Measurements: Height: '5\' 2"'$  (157.5 cm) Weight: 49.9 kg (110 lb) IBW/kg (Calculated) : 50.1 TPN AdjBW (KG): 49.9 Body mass index is 20.12 kg/m. Usual Weight: 53-60 kg  Assessment: 59 yo female presents on 08/17/2019 with fever, dysuria, and decreased intake. On presentation, patient found to have blood glucose of > 600 and started on insulin gtt. Prior to admission patient is on chronic cyclic TPN via R arm PICC and soft diet. TPN was initiated in 02/2019 after perforated prepyloric ulcer which was repaired with a [name] patch and continued to leak. Patient currently has an Chief Strategy Officer in place from January 2021 due to [facility] fistula.   Per patient, decreased appetite and po intake for previous 3-4 days prior to presentation. She reports transitioning from a normal diet of foods such as grits, chicken, tuna, chicken noodle soup, chips, jello, and pudding, to full liquid diet of mainly soup. Per outpatient records, she is on a 14-hour TPN cycle.   TPN was held on admission until 6/10 due to candidemia which is being treated with anidulafungin. PICC line was pulled and reinserted 6/10. Pharmacy consulted to resume TPN on 08/21/19 then blood culture from 6/8 grew 2/4 c. Glabrata and PICC was removed on 6/12 again and TPN held. Blood cultures obtained on 6/12 currently no growth to date and the plan is to replace the PICC and resume TPN today. .    Glucose / Insulin: Hx of DM (controlled by diet outpt). Hyperglycemia on admission, started on insulin gtt, then stable for days. CBGs improved but remain uncontrolled back on continuous TPN. Now Lantus d/c'd 6/12 with TPN held again and CBGs 150-140 off TPN, SSI 6 units/24 hours.  Electrolytes: Na 140. Corrected Ca 10.8. CO2 18. Mg 2.4, Phos 5.6  Renal: Bump in SCr up to 1.08 << 0.8 (BL 0.6-0.8). UOP not accurately charted but appears reduced.  LFTs / TGs: Alk phos 187 -  decrease. AST/ ALT wnl. Tbili 0.7 - decrease. TG 252. Prealbumin / albumin: prealbumin 9.4, albumin 1.7.  Intake / Output; MIVF: no MIVF GI Imaging: 6/7 CT abd - ongoing enterocutaneous fistula, no significant change in size of fluid collection despite removng perc drainage catheter Surgeries / Procedures:   Central access: PICC removed 6/12; replacing 6/16 TPN start date: Chronic TPN prior to admission since 02/2019; TPN resumed on 6/16 - given CBG trends at the last cycling attempt, will start with q24h interval and cycle once CBG control ensured  Nutritional Goals (per outpatient records): kCal: 1471 kcal, Protein: 93 g, Fluid: 1800 ml Goal TPN rate is 75 mL/hr (provides 90g of protein and 1476 kcals per day)  RD goals per note 6/11: KCal: 1500-1700, Protein: 65-80 g, Fluid: >/=1.5L  Current Nutrition:  Boost Breeze PO TID (2 of 3 given yesterday) Soft diet (10-50% charted as given)  Plan:  - Restart TPN at 75 cc/hr to provide 81g protein, 216g CHO, and 45g lipids providing ~1500 kcal and meeting 100% of estimated needs - Electrolytes in TPN: 30mq/L of Na, 528m/L of K, 0 mEq/L of Ca, 37m47mL of Mg, and 0 mmol/L of Phos, max acetate - Add standard MVI and trace elements to TPN - Add 10 units of insulin R to TPN bag - Adjust sensitive SSI to q4h - Monitor TPN labs on Mon/Thurs  Thank you for allowing pharmacy to be a part of this patient's care.  EliAlycia RossettiharmD, BCPS Clinical Pharmacist 08/27/2019  7:25 AM   **Pharmacist phone directory can now be found on [email or web address] ([email or web address]).  Listed under [place].

## 2019-08-27 NOTE — Progress Notes (Signed)
Nutrition Follow-up  DOCUMENTATION CODES:   Non-severe (moderate) malnutrition in context of chronic illness  INTERVENTION:   - Continue Boost Breeze po TID, each supplement provides 250 kcal and 9 grams of protein  - Add Magic Cup TID with meals, each supplement provides 290 kcal and 9 grams of protein  - Continuous TPN per pharmacy  - Encourage adequate PO intake  NUTRITION DIAGNOSIS:   Moderate Malnutrition related to chronic illness (EC fistula, PVD, perforated prepyloric ulcer s/p ex-lap on chronic cyclic TPN) as evidenced by severe fat depletion, moderate muscle depletion.  Ongoing  GOAL:   Patient will meet greater than or equal to 90% of their needs  Progressing  MONITOR:   PO intake, Supplement acceptance, Labs, Weight trends, Skin, I & O's  REASON FOR ASSESSMENT:   Malnutrition Screening Tool    ASSESSMENT:   59 year old female who presented to the ED on 6/06 with fever, dysuria. PMH of perforated prepyloric ulcer in 2020 s/p ex-lap with [name] patch, chronic cyclic TPN via right arm PICC, T1DM, bilateral lower quadrant drains along with enterocutaneous fistula with Eakin pouch, chronic stage II sacral pressure injuries, HTN, schizophrenia, PVD s/p bilateral BKA. Pt admitted with sepsis secondary to fungemia, source unclear at this time.  6/10 - PICC replaced, TPN restarted 6/11 - TPN changed to 24-hour cycle 6/12 - PICC removed, TPN held 6/16 - PICC to be replaced, TPN to restart  Continuous TPN to restart tonight at 75 ml/hr to provide 1500 kcal and 81 grams of protein.  RD unable to speak with pt today as pt was having PICC line placed at time of RD visit. Pt accepting ~90% of all Boost Breeze supplements per [facility] documentation. RD will add Magic Cups with meals for added kcal and protein.  Per GI notes, pt having some formed BMs via rectum. Questran added for loose BMs via Eakin pouch.  Meal Completion: 0-50% x last 8 recorded meals  Medications  reviewed and include: vitamin C 500 mg BID, Questran BID, Boost Breeze TID, ferrous sulfate, SSI q 4 hours, protonix, Eraxis  Labs reviewed: BUN 25, creatinine 1.08, phosphorus 5.6, hemoglobin 8.5 CBG's: 147-227 x 24 hours  Eakin pouch: 4325 ml x 24 hours  Diet Order:   Diet Order            DIET SOFT Room service appropriate? Yes; Fluid consistency: Thin  Diet effective now                 EDUCATION NEEDS:   Not appropriate for education at this time  Skin:  Skin Assessment: Skin Integrity Issues: Stage IV: bilateral coccyx  Last BM:  08/27/19 Eakin's pouch  Height:   Ht Readings from Last 1 Encounters:  08/17/19 5\' 2"  (1.575 m)    Weight:   Wt Readings from Last 1 Encounters:  08/17/19 49.9 kg    Ideal Body Weight:  43.5 kg (adjusted for bilateral BKA)  BMI:  Body mass index is 20.12 kg/m.  Estimated Nutritional Needs:   Kcal:  1500-1700  Protein:  65-80 grams  Fluid:  >/= 1.5 L    Gaynell Face, MS, RD, LDN Inpatient Clinical Dietitian Pager: [phone] Weekend/After Hours: [phone]

## 2019-08-27 NOTE — Plan of Care (Signed)
  Problem: Education: Goal: Knowledge of General Education information will improve Description: Including pain rating scale, medication(s)/side effects and non-pharmacologic comfort measures Outcome: Progressing   Problem: Health Behavior/Discharge Planning: Goal: Ability to manage health-related needs will improve Outcome: Progressing   Problem: Coping: Goal: Level of anxiety will decrease Outcome: Progressing   Problem: Elimination: Goal: Will not experience complications related to bowel motility Outcome: Progressing Goal: Will not experience complications related to urinary retention Outcome: Progressing   Problem: Pain Managment: Goal: General experience of comfort will improve Outcome: Progressing   Problem: Safety: Goal: Ability to remain free from injury will improve Outcome: Progressing

## 2019-08-27 NOTE — Progress Notes (Signed)
Peripherally Inserted Central Catheter Placement  The IV Nurse has discussed with the patient and/or persons authorized to consent for the patient, the purpose of this procedure and the potential benefits and risks involved with this procedure.  The benefits include less needle sticks, lab draws from the catheter, and the patient may be discharged home with the catheter. Risks include, but not limited to, infection, bleeding, blood clot (thrombus formation), and puncture of an artery; nerve damage and irregular heartbeat and possibility to perform a PICC exchange if needed/ordered by physician.  Alternatives to this procedure were also discussed.  Bard Power PICC patient education guide, fact sheet on infection prevention and patient information card has been provided to patient /or left at bedside.    PICC Placement Documentation  PICC Double Lumen 08/27/19 PICC Right Brachial 33 cm 0 cm (Active)  Indication for Insertion or Continuance of Line Administration of hyperosmolar/irritating solutions (i.e. TPN, Vancomycin, etc.) 08/27/19 1500  Exposed Catheter (cm) 0 cm 08/27/19 1500  Site Assessment Clean;Dry;Intact 08/27/19 1500  Lumen #1 Status Flushed;Saline locked;Blood return noted 08/27/19 1500  Lumen #2 Status Flushed;Saline locked;Blood return noted 08/27/19 1500  Dressing Type Transparent;Securing device 08/27/19 1500  Dressing Status Clean;Dry;Intact;Antimicrobial disc in place 08/27/19 1500  Dressing Change Due 09/03/19 08/27/19 1500       Holley Bouche [place] 08/27/2019, 3:55 PM

## 2019-08-27 NOTE — Progress Notes (Signed)
PHARMACY CONSULT NOTE FOR:  OUTPATIENT  PARENTERAL ANTIBIOTIC THERAPY (OPAT)  Indication: Candidemia Regimen: Caspofungin 70 mg x1, then 50 mg once daily End date: 09/06/2019  IV antibiotic discharge orders are pended. To discharging provider:  please sign these orders via discharge navigator,  Select New Orders & click on the button choice - Manage This Unsigned Work.     Thank you for allowing pharmacy to be a part of this patient's care.  Lorel Monaco, PharmD PGY1 Ambulatory Care Resident

## 2019-08-27 NOTE — Progress Notes (Signed)
Patient ID: Gina [name], female   DOB: Aug 07, 1960, 59 y.o.   MRN: [id number]          [facility] for Infectious Disease    Date of Admission:  08/17/2019   Day 10 anidulafungin         [name] remains clinically stable and afebrile on therapy for candidemia.  Repeat blood cultures are negative at 4 days.  I am comfortable with PICC replacement at any time and restarting TPN.  She needs 2 weeks of anidulafungin starting from the negative blood culture on 08/23/2019.  I will sign off now.  Diagnosis: PICC line infection  Culture Result: Candidemia  Allergies  Allergen Reactions  . Bee Venom Swelling    Swells up with bee stings   . Metformin And Related Other (See Comments)    Upset stomach    OPAT Orders Discharge antibiotics to be given via PICC line Discharge antibiotics: Per pharmacy protocol anidulafungin (or comparable echinocandin) Duration: 20 days End Date: 09/06/2019  [facility] Care Per Protocol:  Home health RN for IV administration and teaching; PICC line care and labs.    Labs weekly while on IV antibiotics: _x_ CBC with differential _x_ BMP __ CMP __ CRP __ ESR __ Vancomycin trough __ CK  __ Please pull PIC at completion of IV antibiotics __ Please leave PIC in place until doctor has seen patient or been notified  Fax weekly labs to [phone]  Clinic Follow Up Appt: As needed         Michel Bickers, [place] for [place] [phone] pager   [phone] cell 08/27/2019, 1:06 PM

## 2019-08-27 NOTE — TOC Progression Note (Addendum)
Transition of Care [facility]) - Progression Note    Patient Details  Name: Lonnie Rosado MRN: [id number] Date of Birth: 10-17-1960  Transition of [facility] CM/SW Contact  Carles Collet, RN Phone Number: 08/27/2019, 4:40 PM  Clinical Narrative:   Informed by bedside nurse that patient's sister was raising concern that [place] could not enter house, that doors stayed locked, and that patient was not supervised at home. Spoke w patient's daughter, with whom she lives (sister not listed on patient contact list).  She states that patient lives at home with her, her brother, and her four children. The patient has 24 hour supervision.  [name] states that she is a Quarry manager and a phlebotomist. She fluently speaks of administering TPN, patients current level of function, and goals of care. She states that she has applied 2.5 months ago for additional CAPS help and feels it will be approved soon.  She states that there are handicapped handles on doors that lock for which the patient's sister does not have a key. However there is someone home to let in [facility], this has not been a problem. At this time, I have no concerns regarding level of support for patient and feel her return to home with [facility] services is appropriate.    Greenberger,[name] Daughter [phone]       Expected Discharge Plan: [place] Barriers to Discharge: Continued Medical Work up  Expected Discharge Plan and Services Expected Discharge Plan: [place]   Discharge Planning Services: CM Consult Post Acute Care Choice: [place] arrangements for the past 2 months: Single Family Home                   DME Agency: NA       HH Arranged: PT, Nurse's Aide [place] Agency: [place] Date [place]: 08/20/19 Time [place]: 1538 Representative spoke with at [place]: [place] ([place]) Interventions    Readmission Risk  Interventions Readmission Risk Prevention Plan 08/19/2019 02/10/2019 11/07/2017  Post Dischage Appt - - Patient refused  Medication Screening - - Complete  Transportation Screening Complete Complete Complete  PCP follow-up - - Patient refused  PCP or Specialist Appt within 3-5 Days - Not Complete -  Not Complete comments - DC date unknown- pt established with PCP -  [place] or [facility] - Complete -  Social Work Consult for [place] Planning/Counseling - Not Complete -  SW consult not completed comments - awaiting call back from daughter and APS -  [place] - Not Complete -  Palliative Care Screening Not Complete Comments - pending need -  Medication Review (RN Care Manager) Complete Referral to [place] or Home Care Consult Complete - -  SW Recovery Care/Counseling Consult Complete - -  Palliative Care Screening Not Applicable - -  [place] Not Applicable - -  Some recent data might be hidden

## 2019-08-27 NOTE — Consult Note (Signed)
[place] Nurse Consult Note: Reason for Consult: leaking around eakin Patient is known to the [facility] nursing team. Orders in the computer with instructions for change. Managed at home by family.  Wear time is about 2 days at home.  Wound type: EC fistula, midline Pressure Injury POA: NA Measurement:0.3cmx 0.3cm with slightly denuded skin circumfencially Wound FYT:WKMQKM to visualize, active fistula  Drainage (amount, consistency, odor) brown effluent, small amount Periwound: intact; see above. Creasing at 3 and 9 o'clock  Dressing procedure/placement/frequency: Eakin pouch placed by staff with out cutting to accommodate  wound bed. Removed and made new pattern for pouch, strips of Eakin placed around wound bed and ostomy barrier ring to flatten surface. Small Eakin placed. Patient tolerated well. Connected to bedside drainage bag.   [place] Nurse team will follow with you and see patient within 10 days for wound assessments.  Please notify [place] nurses of any acute changes in the wounds or any new areas of concern [place] MSN, [place], [place], [place]

## 2019-08-28 LAB — GLUCOSE, CAPILLARY
Glucose-Capillary: 140 mg/dL — ABNORMAL HIGH (ref 70–99)
Glucose-Capillary: 141 mg/dL — ABNORMAL HIGH (ref 70–99)
Glucose-Capillary: 173 mg/dL — ABNORMAL HIGH (ref 70–99)
Glucose-Capillary: 180 mg/dL — ABNORMAL HIGH (ref 70–99)
Glucose-Capillary: 185 mg/dL — ABNORMAL HIGH (ref 70–99)
Glucose-Capillary: 194 mg/dL — ABNORMAL HIGH (ref 70–99)
Glucose-Capillary: 217 mg/dL — ABNORMAL HIGH (ref 70–99)

## 2019-08-28 LAB — COMPREHENSIVE METABOLIC PANEL
ALT: 18 U/L (ref 0–44)
AST: 12 U/L — ABNORMAL LOW (ref 15–41)
Albumin: 1.9 g/dL — ABNORMAL LOW (ref 3.5–5.0)
Alkaline Phosphatase: 145 U/L — ABNORMAL HIGH (ref 38–126)
Anion gap: 7 (ref 5–15)
BUN: 43 mg/dL — ABNORMAL HIGH (ref 6–20)
CO2: 20 mmol/L — ABNORMAL LOW (ref 22–32)
Calcium: 8.7 mg/dL — ABNORMAL LOW (ref 8.9–10.3)
Chloride: 110 mmol/L (ref 98–111)
Creatinine, Ser: 1.53 mg/dL — ABNORMAL HIGH (ref 0.44–1.00)
GFR calc Af Amer: 43 mL/min — ABNORMAL LOW (ref >60)
GFR calc non Af Amer: 37 mL/min — ABNORMAL LOW (ref >60)
Glucose, Bld: 149 mg/dL — ABNORMAL HIGH (ref 70–99)
Potassium: 4.4 mmol/L (ref 3.5–5.1)
Sodium: 137 mmol/L (ref 135–145)
Total Bilirubin: 0.5 mg/dL (ref 0.3–1.2)
Total Protein: 7 g/dL (ref 6.5–8.1)

## 2019-08-28 LAB — MAGNESIUM: Magnesium: 2.4 mg/dL (ref 1.7–2.4)

## 2019-08-28 LAB — PHOSPHORUS: Phosphorus: 4.5 mg/dL (ref 2.5–4.6)

## 2019-08-28 LAB — CULTURE, BLOOD (ROUTINE X 2)
Culture: NO GROWTH
Culture: NO GROWTH
Special Requests: ADEQUATE
Special Requests: ADEQUATE

## 2019-08-28 MED ORDER — TRAVASOL 10 % IV SOLN
INTRAVENOUS | Status: AC
  Filled 2019-08-28: qty 810.9

## 2019-08-28 MED ORDER — SODIUM CHLORIDE 0.9 % IV BOLUS
500.0000 mL | Freq: Once | INTRAVENOUS | Status: AC
  Administered 2019-08-28: 500 mL via INTRAVENOUS

## 2019-08-28 MED ORDER — SODIUM CHLORIDE 0.9 % IV SOLN: INTRAVENOUS | Status: DC

## 2019-08-28 NOTE — Progress Notes (Signed)
[facility] - TOTAL PARENTERAL NUTRITION CONSULT NOTE   Indication: Fistula  Patient Measurements: Height: _0  (157.5 cm) Weight: 49.9 kg (110 lb) IBW/kg (Calculated) : 50.1 TPN AdjBW (KG): 49.9 Body mass index is 20.12 kg/m. Usual Weight: 53-60 kg  Assessment: 59 yo female presents on 08/17/2019 with fever, dysuria, and decreased intake. On presentation, patient found to have blood glucose of > 600 and started on insulin gtt. Prior to admission patient is on chronic cyclic TPN via R arm PICC and soft diet. TPN was initiated in 02/2019 after perforated prepyloric ulcer which was repaired with a [name] patch and continued to leak. Patient currently has an Chief Strategy Officer in place from January 2021 due to [facility] fistula.   Per patient, decreased appetite and po intake for previous 3-4 days prior to presentation. She reports transitioning from a normal diet of foods such as grits, chicken, tuna, chicken noodle soup, chips, jello, and pudding, to full liquid diet of mainly soup. Per outpatient records, she is on a 14-hour TPN cycle.   TPN was held on admission until 6/10 due to candidemia which is being treated with anidulafungin. PICC line was pulled and reinserted 6/10. Pharmacy consulted to resume TPN on 08/21/19 then blood culture from 6/8 grew 2/4 c. Glabrata and PICC was removed on 6/12 again and TPN held. Blood cultures obtained on 6/12 are still ngtd so the PICC was replaced and TPN resumed on 6/16.   Glucose / Insulin: Hx of DM (controlled by diet outpt). Hyperglycemia on admission, started on insulin gtt, then stable for days. CBGs elevated on attempt at cyclic TPN overnight on 6/10 so will stabilize on continuous TPN prior to reattempting cycling. CBGs 150-300 since TPN initiated. SSI/12h: 9 units Electrolytes: Na 137. Corrected Ca 10.3. CO2 20 << 18. Mg 2.4, Phos 4.5  Renal: Bump in SCr up to 1.53 << 1.08 (BL 0.6-0.8). UOP 0.5 ml/kg/hr LFTs / TGs: Alk phos 145 - decrease. AST/ ALT okay. Tbili 0.5  - decrease. TG 252 (6/14) - will watch closely. Prealbumin / albumin: Prealbumin 9.4 (6/15), albumin 1.9 << 1.7 Intake / Output; MIVF: no MIVF, stool output ~2500 cc/24h GI Imaging: 6/7 CT abd - ongoing enterocutaneous fistula, no significant change in size of fluid collection despite removng perc drainage catheter Surgeries / Procedures:   Central access: PICC removed 6/12; replaced 6/16 TPN start date: Chronic TPN prior to admission since 02/2019; Held for fungemia and PICC replacement, resumed 6/16  Nutritional Goals (per outpatient records): kCal: 1471 kcal, Protein: 93 g, Fluid: 1800 ml Goal TPN rate is 75 mL/hr (provides 90g of protein and 1476 kcals per day)  RD goals per note 6/11: KCal: 1500-1700, Protein: 65-80 g, Fluid: >/=1.5L  Current Nutrition:  Boost Breeze PO TID (3 of 3 given yesterday) Soft diet (0-50% charted as given)  Plan:  - Cycle TPN 1800 ml over 18 hours to provide 81g protein, 216g CHO, and 45g lipids providing ~1500 kcal and meeting 100% of estimated needs - If the patient tolerates 18h cycle without overt hyperglycemia - will attempt to reduce down to home 14h cycle.  - Electrolytes in TPN: increase Na to 26mq/L, reduce K to 360m/L, 0 mEq/L of Ca, 9m84mL of Mg, and 0 mmol/L of Phos, max acetate - Add standard MVI and trace elements to TPN - Continue with 10 units of insulin R to TPN bag - Continue sensitive SSI q4h for now - Monitor TPN labs on Mon/Thurs, BMET, Mg/Phos in AM  Thank you for  allowing pharmacy to be a part of this patient's care.  Alycia Rossetti, PharmD, BCPS Clinical Pharmacist 08/28/2019 7:14 AM   **Pharmacist phone directory can now be found on [email or web address] ([email or web address]).  Listed under [place].

## 2019-08-28 NOTE — Evaluation (Addendum)
Occupational Therapy Evaluation Patient Details Name: Gina [name] MRN: [id number] DOB: August 29, 1960 Today's Date: 08/28/2019    History of Present Illness Pt is a 59 y/o female admitted secondary to fever and sepsis from candida glabarata fungenia. PMH includes HTN, DM, schizophrenia, bilateral BKA, hx of perforated ulcer, colostomy bag.    Clinical Impression   PTA pt living with her children and needing assist for most BADL/IADL. Pt reports needing assist with wheelchair transfers, toileting, hygiene, bathing, and dressing. She has a hoyer lift at home but family does not use it, they "pick her up" instead. At time of eval, pt completing bed mobility with mod A +2 and AP transfers with max A +2. Pt needing increased cueing on how to problem solve and engage in transfers. Noted cognitive deficits in safety awareness, attention, and problem solving. Ostomy bag full of gas with some leakage during session, RN notified. Given current status and many readmissions, recommend pt d/c to SNF for continued higher level of care for safety and progression of BADLs. Will continue to follow per POC listed below.     Follow Up Recommendations  SNF (pt likely to refuse, will need max HH)    Equipment Recommendations  None recommended by OT    Recommendations for Other Services       Precautions / Restrictions Precautions Precautions: Fall Precaution Comments: bilateral BKA Restrictions Weight Bearing Restrictions: No      Mobility Bed Mobility Overal bed mobility: Needs Assistance Bed Mobility: Supine to Sit     Supine to sit: Mod assist;+2 for safety/equipment     General bed mobility comments: mod A +2 to rise into long sitting with 1 HHA, use of bedrail, and posterior trncal support. Requires min-mod assist to maintain upright sitting balance with cueing for propping  Transfers Overall transfer level: Needs assistance Equipment used: None Transfers: Ecologist transfers: Max assist;+2 physical assistance;+2 safety/equipment   General transfer comment: max A +2 to scoot back into recliner with use of pad to facilitate scooting. Cues for hand placement given but pt needing significant reminders to assist in transfers    Balance Overall balance assessment: Needs assistance Sitting-balance support: Bilateral upper extremity supported Sitting balance-Leahy Scale: Poor Sitting balance - Comments: reliant on min-mod assist to maintain upright sitting, R lateral leaning Postural control: Right lateral lean                                 ADL either performed or assessed with clinical judgement   ADL Overall ADL's : Needs assistance/impaired Eating/Feeding: Set up;Sitting   Grooming: Set up;Sitting   Upper Body Bathing: Minimal assistance;Sitting   Lower Body Bathing: Moderate assistance;Sitting/lateral leans   Upper Body Dressing : Set up;Sitting   Lower Body Dressing: Moderate assistance;Sitting/lateral leans   Toilet Transfer: Maximal assistance;+2 for physical assistance;+2 for safety/equipment Toilet Transfer Details (indicate cue type and reason): simulated with chair transfer- max A +2 AP transfer. Cues for hand placement Toileting- Clothing Manipulation and Hygiene: Maximal assistance Toileting - Clothing Manipulation Details (indicate cue type and reason): uses brief for urination, needs assist to manage ostomy bag     Functional mobility during ADLs: Maximal assistance;+2 for physical assistance;+2 for safety/equipment (AP t/f)       Vision Patient Visual Report: No change from baseline       Perception     Praxis  Pertinent Vitals/Pain Pain Assessment: Faces Faces Pain Scale: Hurts little more Pain Location: wound near sacrum, especially during scooting Pain Descriptors / Indicators: Sore Pain Intervention(s): Monitored during session;Repositioned     Hand Dominance Right    Extremity/Trunk Assessment Upper Extremity Assessment Upper Extremity Assessment: Generalized weakness LUE Deficits / Details: L hand finger contractures noted, however, baseline for pt. finger amp   Lower Extremity Assessment Lower Extremity Assessment: LLE deficits/detail;RLE deficits/detail RLE Deficits / Details: R BKA at baseline LLE Deficits / Details: L BKA at baseline    Cervical / Trunk Assessment Cervical / Trunk Assessment: Normal   Communication Communication Communication: No difficulties   Cognition Arousal/Alertness: Awake/alert Behavior During Therapy: WFL for tasks assessed/performed Overall Cognitive Status: History of cognitive impairments - at baseline Area of Impairment: Attention;Following commands;Safety/judgement;Problem solving                   Current Attention Level: Selective   Following Commands: Follows one step commands consistently;Follows one step commands with increased time Safety/Judgement: Decreased awareness of safety;Decreased awareness of deficits   Problem Solving: Slow processing;Requires verbal cues;Requires tactile cues General Comments: Pt follows simple commands well, at times needs PT/OT to rephrase for participation. Increased time and effort for problem solving during mobility, poor insight into deficits and carryover into every day life.   General Comments       Exercises     Shoulder Instructions      Home Living Family/patient expects to be discharged to:: Private residence Living Arrangements: Children Available Help at Discharge: Family;Available 24 hours/day Type of Home: Apartment Home Access: Level entry     Home Layout: One level     Bathroom Shower/Tub: Teacher, early years/pre: Standard     Home Equipment: Hospital bed;Wheelchair - manual;Other (comment)   Additional Comments: Has an aide for 3 hours/day, 7 days/week. Has hoyer lift      Prior Functioning/Environment Level of  Independence: Needs assistance  Gait / Transfers Assistance Needed: family assists with transfers to w/c  ADL's / Homemaking Assistance Needed: daughter helps with dressing/bathing. Reports using briefs for toileting- has ostomy bag            OT Problem List: Decreased strength;Decreased knowledge of use of DME or AE;Decreased activity tolerance;Decreased cognition;Impaired balance (sitting and/or standing);Decreased safety awareness;Pain      OT Treatment/Interventions: Self-care/ADL training;Therapeutic exercise;Patient/family education;Balance training;Energy conservation;Therapeutic activities;DME and/or AE instruction;Cognitive remediation/compensation    OT Goals(Current goals can be found in the care plan section) Acute Rehab OT Goals Patient Stated Goal: get better at transfers (especially to car) OT Goal Formulation: With patient Time For Goal Achievement: 10/08/2019 Potential to Achieve Goals: Good  OT Frequency: Min 2X/week   Barriers to D/C:            Co-evaluation PT/OT/SLP Co-Evaluation/Treatment: Yes Reason for Co-Treatment: For patient/therapist safety;To address functional/ADL transfers PT goals addressed during session: Mobility/safety with mobility;Balance OT goals addressed during session: ADL's and self-care;Strengthening/ROM      AM-PAC OT "6 Clicks" Daily Activity     Outcome Measure Help from another person eating meals?: A Little Help from another person taking care of personal grooming?: A Little Help from another person toileting, which includes using toliet, bedpan, or urinal?: A Lot Help from another person bathing (including washing, rinsing, drying)?: A Lot Help from another person to put on and taking off regular upper body clothing?: A Little Help from another person to put on and taking off regular lower body  clothing?: A Lot 6 Click Score: 15   End of Session Nurse Communication: Mobility status  Activity Tolerance: Patient tolerated  treatment well Patient left: in chair;with call bell/phone within reach;with chair alarm set  OT Visit Diagnosis: Other abnormalities of gait and mobility (R26.89);Muscle weakness (generalized) (M62.81)                Time: 6438-3779 OT Time Calculation (min): 25 min Charges:  OT General Charges $OT Visit: 1 Visit OT Evaluation $OT Eval Moderate Complexity: [place], MSOT, OTR/L Acute Rehabilitation Services [facility] Office Number: [phone] Pager: [phone]  Zenovia Jarred 08/28/2019, 4:25 PM

## 2019-08-28 NOTE — Progress Notes (Signed)
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  She was admitted a few times this year for sepsis, including MRSA bacteremia, fungemia in March, Candida peritonitis in April and Proteus UTI in May.  Patient on TPN and she also takes soft diet to improve her nutrition status.  Daughter noticed that patient has had decreased appetite for the last 3 to 4 days prior to admission and was febrile.  She she was complaining of dysuria.  Per daughter when appears to be stable. She was admitted with fungemia.   Assessment & Plan:   Principal Problem:   Candidemia ([place]) Active Problems:   HTN (hypertension)   Diabetes ([place])   Anemia of chronic disease   Schizophrenia ([place])   Acute kidney injury ([place])   Sacral decubitus ulcer   Normocytic anemia   S/P BKA (below knee amputation) bilateral ([facility])   Status post colostomy ([place])   Enterocutaneous fistula   Urinary tract infection due to Proteus   Iron deficiency anemia   1-Sepsis secondary to Candida glabrata fungemia, POA; Patient presented with fever, normal white count, tachycardia.  Blood cultures from 08/17/2019 1 out of 2 positive for Candida. Repeated blood cultures on 08/19/2019 continue to growth Candida. PICC line was removed, patient had a PICC line holiday cough. Repeated blood cultures on 6/12; no growth for the last 3 days. CT abdomen and pelvis show no significant changes in size fluid collection. Patient was also evaluated by ophthalmology on 08/19/2019 and endophthalmitis  was ruled out. She was a started on Eraxis. Per ID if blood cultures continue to remain negative today okay to proceed with PICC line placement. She will need to complete 2 weeks from 6/12 of Eraxis.   2-History of perforated gastric ulcer status post oversewing and Grahan patch on 01/2019/enterocutaneous fistula: -CT abdomen and pelvis with persistent fluid collection within the ventral aspect of peritoneal cavity with continued fistula to the skin surface at level of umbilicus, presumably ongoing enterocutaneous fistula, no significant change in size.  Per general surgery note from 06/2019, appears developed enterocutaneous fistula postop from perforated ulcer treated with oversewing and [name] patch. Continue with Questran for watery stool  3-Diabetes mellitus type 2: hb A1c 7.0 Continue with a sliding scale Might need  to resume Lantus when back on TPN  4-anemia/iron deficiency anemia of chronic disease: Patient noted to have a drop in hemoglobin no signs of overt bleeding.  Felt to be delusional. Received 2 unit of packed red blood cell. GI was consulted, they recommend that if patient hemoglobin dropped again patient will need a bleeding scan Continue with PPI Received IV Feraheme  5-acute kidney injury: worsening kidney function today.  Plan to resume IV fluids and repeat labs in am.   6-Proteus mirabilis UTI; Urine culture grew more than 100 K Proteus mirabilis Of 3 days of IV antibiotics  7-hypertension: Continue with Norvasc and Lopressor 8-stage II pressure ulcer POA coccyx: Continue with local care  Pressure Injury 05/22/19 Coccyx Bilateral Stage 4 - Full thickness tissue loss with exposed bone, tendon or muscle. Per wound nurse, healing stage IV from prior  admission (Active)  05/22/19 2104  Location: Coccyx  Location Orientation: Bilateral  Staging: Stage 4 - Full thickness tissue loss with exposed bone, tendon or muscle.  Wound Description (Comments): Per wound  nurse, healing stage IV from prior admission  Present on Admission: Yes     Pressure Injury 05/22/19 Knee Right;Distal Unstageable - Full thickness tissue loss in which the base of the injury is covered by slough (yellow, tan, gray, green or brown) and/or eschar (tan, brown or black) in the wound bed. Stump (Active)  05/22/19 2106  Location: Knee  Location Orientation: Right;Distal  Staging: Unstageable - Full thickness tissue loss in which the base of the injury is covered by slough (yellow, tan, gray, green or brown) and/or eschar (tan, brown or black) in the wound bed.  Wound Description (Comments): Stump  Present on Admission: Yes     Pressure Injury 05/22/19 Knee Left;Distal Unstageable - Full thickness tissue loss in which the base of the injury is covered by slough (yellow, tan, gray, green or brown) and/or eschar (tan, brown or black) in the wound bed. Stump (Active)  05/22/19 2108  Location: Knee  Location Orientation: Left;Distal  Staging: Unstageable - Full thickness tissue loss in which the base of the injury is covered by slough (yellow, tan, gray, green or brown) and/or eschar (tan, brown or black) in the wound bed.  Wound Description (Comments): Stump  Present on Admission: Yes     Pressure Injury 05/22/19 Thigh Left;Mid Stage 2 -  Partial thickness loss of dermis presenting as a shallow open injury with a red, pink wound bed without slough. (Active)  05/22/19 2109  Location: Thigh  Location Orientation: Left;Mid  Staging: Stage 2 -  Partial thickness loss of dermis presenting as a shallow open injury with a red, pink wound bed without slough.  Wound Description (Comments):   Present on Admission: Yes     Pressure Injury 07/22/19 Sacrum Mid Stage 3 -  Full thickness tissue loss. Subcutaneous fat may be visible but bone, tendon or muscle are NOT exposed. (Active)  07/22/19   Location: Sacrum  Location Orientation: Mid  Staging: Stage 3 -  Full thickness tissue loss.  Subcutaneous fat may be visible but bone, tendon or muscle are NOT exposed.  Wound Description (Comments):   Present on Admission: Yes     Pressure Injury 07/22/19 Leg Left Stage 3 -  Full thickness tissue loss. Subcutaneous fat may be visible but bone, tendon or muscle are NOT exposed. (Active)  07/22/19   Location: Leg  Location Orientation: Left  Staging: Stage 3 -  Full thickness tissue loss. Subcutaneous fat may be visible but bone, tendon or muscle are NOT exposed.  Wound Description (Comments):   Present on Admission: Yes     Pressure Injury 07/22/19 Leg Right Stage 3 -  Full thickness tissue loss. Subcutaneous fat may be visible but bone, tendon or muscle are NOT exposed. (Active)  07/22/19   Location: Leg  Location Orientation: Right  Staging: Stage 3 -  Full thickness tissue loss. Subcutaneous fat may be visible but bone, tendon or muscle are NOT exposed.  Wound Description (Comments):   Present on Admission: Yes     Nutrition Problem: Moderate Malnutrition Etiology: chronic illness (EC fistula, PVD, perforated prepyloric ulcer s/p ex-lap on chronic cyclic TPN)    Signs/Symptoms: severe fat depletion, moderate muscle depletion    Interventions: Boost Breeze, TPN  Estimated body mass index is 20.12 kg/m as calculated from the following:  Height as of this encounter: 5\' 2"  (1.575 m).   Weight as of this encounter: 49.9 kg.   DVT prophylaxis: SCDs  Code Status: DNR Family Communication: Care discussed with patient Disposition Plan:  Status is: Inpatient  Remains inpatient appropriate because:IV treatments appropriate due to intensity of illness or inability to take PO   Dispo: The patient is from: Home              Anticipated d/c is to: Home              Anticipated d/c date is: 1 day              Patient currently is not medically stable to d/c.  Worsening kidney function today, plan to start IV fluids, repeat labs in am.          Consultants:    Ophthalmology: Dr. Ellie Lunch 08/19/2019  ID: Dr. Megan Salon 08/17/2019  Gastroenterology: Dr. Therisa Doyne 08/25/2019   Procedures:   CT abdomen and pelvis 08/18/2019  Chest x-ray 08/17/2019  2D echo 08/18/2019  PICC line removal  2 units packed red blood cell transfusion 08/18/2019  PICC line 08/21/2019  PICC line removal 08/23/2019  Abdominal films pending 08/26/2019  Antimicrobials:   IV Eraxis 08/18/2019>>>>   IV cefepime 08/17/2019>>>> 08/18/2019  IV Rocephin 6/9/2021x1 dose  Subjective: No new complaints.   Objective: Vitals:   08/28/19 0300 08/28/19 0430 08/28/19 0820 08/28/19 1235  BP:  109/65    Pulse:    69  Resp: 12 14    Temp:  98.6 F (37 C) 97.7 F (36.5 C) 98 F (36.7 C)  TempSrc:  Oral Oral Oral  SpO2:  96%  95%  Weight:      Height:        Intake/Output Summary (Last 24 hours) at 08/28/2019 1402 Last data filed at 08/28/2019 0449 Gross per 24 hour  Intake 936.26 ml  Output 1950 ml  Net -1013.74 ml   Filed Weights   08/17/19 0900  Weight: 49.9 kg    Examination:  General exam: NAD, chronic ill appearing Respiratory system: CTA Cardiovascular system: S 1, S 2 RRR Gastrointestinal system: BS present, soft, enterocutaneous fistula, ostomy bag.  Central nervous system: Alert Extremities: no edema, B? BKA  Data Reviewed: I have personally reviewed following labs and imaging studies  CBC: Recent Labs  Lab 08/22/19 0640 08/24/19 1744 08/25/19 0605 08/26/19 0248 08/27/19 0321  WBC 8.2 8.0 7.4 7.1 7.2  NEUTROABS 5.9 4.9 5.6 3.8  --   HGB 9.2* 8.7* 8.0* 8.5* 8.5*  HCT 28.3* 28.1* 25.7* 26.9* 27.1*  MCV 89.3 91.8 92.1 92.1 92.2  PLT 201 279 311 333 462   Basic Metabolic Panel: Recent Labs  Lab 08/24/19 1744 08/25/19 0605 08/26/19 0248 08/27/19 0321 08/28/19 0500  NA 135 134* 137 140 137  K 4.5 3.9 3.8 3.8 4.4  CL 108 105 110 114* 110  CO2 19* 21* 20* 18* 20*  GLUCOSE 152* 196* 199* 178* 149*  BUN 40* 38* 35* 35* 43*  CREATININE 0.80 0.85  0.80 1.08* 1.53*  CALCIUM 8.8* 8.7* 9.0 9.0 8.7*  MG 1.8 1.7 2.6* 2.4 2.4  PHOS 3.6 3.6 4.8* 5.6* 4.5   GFR: Estimated Creatinine Clearance: 31.6 mL/min (A) (by C-G formula based on SCr of 1.53 mg/dL (H)). Liver Function Tests: Recent Labs  Lab 08/22/19 0640 08/25/19 0605 08/28/19 0500  AST 55* 11* 12*  ALT 41 22 18  ALKPHOS 266* 187* 145*  BILITOT 1.7* 0.7  0.5  PROT 6.4* 6.6 7.0  ALBUMIN 1.8* 1.7* 1.9*   No results for input(s): LIPASE, AMYLASE in the last 168 hours. No results for input(s): AMMONIA in the last 168 hours. Coagulation Profile: No results for input(s): INR, PROTIME in the last 168 hours. Cardiac Enzymes: No results for input(s): CKTOTAL, CKMB, CKMBINDEX, TROPONINI in the last 168 hours. BNP (last 3 results) No results for input(s): PROBNP in the last 8760 hours. HbA1C: No results for input(s): HGBA1C in the last 72 hours. CBG: Recent Labs  Lab 08/27/19 1944 08/28/19 0016 08/28/19 0430 08/28/19 0820 08/28/19 1235  GLUCAP 291* 217* 141* 185* 140*   Lipid Profile: No results for input(s): CHOL, HDL, LDLCALC, TRIG, CHOLHDL, LDLDIRECT in the last 72 hours. Thyroid Function Tests: No results for input(s): TSH, T4TOTAL, FREET4, T3FREE, THYROIDAB in the last 72 hours. Anemia Panel: No results for input(s): VITAMINB12, FOLATE, FERRITIN, TIBC, IRON, RETICCTPCT in the last 72 hours. Sepsis Labs: No results for input(s): PROCALCITON, LATICACIDVEN in the last 168 hours.  Recent Results (from the past 240 hour(s))  Culture, blood (routine x 2)     Status: Abnormal   Collection Time: 08/19/19  5:56 AM   Specimen: BLOOD LEFT HAND  Result Value Ref Range Status   Specimen Description BLOOD LEFT HAND  Final   Special Requests   Final    BOTTLES DRAWN AEROBIC AND ANAEROBIC Blood Culture adequate volume   Culture  Setup Time   Final    ANAEROBIC BOTTLE ONLY YEAST CRITICAL VALUE NOTED.  VALUE IS CONSISTENT WITH PREVIOUSLY REPORTED AND CALLED VALUE. Performed at  [facility], [place] [address], [place], [place] [address]    Culture CANDIDA GLABRATA (A)  Final   Report Status 08/26/2019 FINAL  Final  Culture, blood (routine x 2)     Status: Abnormal   Collection Time: 08/19/19  5:56 AM   Specimen: BLOOD LEFT HAND  Result Value Ref Range Status   Specimen Description BLOOD LEFT HAND  Final   Special Requests   Final    BOTTLES DRAWN AEROBIC AND ANAEROBIC Blood Culture adequate volume   Culture  Setup Time (A)  Final    YEAST AEROBIC BOTTLE ONLY CRITICAL RESULT CALLED TO, READ BACK BY AND VERIFIED WITH: PHARMD Y PATEL [id number] AT 75 AM BY CM Performed at [facility], [place] [address], [place], [place] [address]    Culture CANDIDA GLABRATA (A)  Final   Report Status 08/24/2019 FINAL  Final  Culture, blood (routine x 2)     Status: None   Collection Time: 08/23/19 11:17 AM   Specimen: BLOOD LEFT FOREARM  Result Value Ref Range Status   Specimen Description BLOOD LEFT FOREARM  Final   Special Requests   Final    BOTTLES DRAWN AEROBIC ONLY Blood Culture adequate volume   Culture   Final    NO GROWTH 5 DAYS Performed at [facility], [place] [address], [place],  [address]    Report Status 08/28/2019 FINAL  Final  Culture, blood (routine x 2)     Status: None   Collection Time: 08/23/19 11:22 AM   Specimen: BLOOD LEFT FOREARM  Result Value Ref Range Status   Specimen Description BLOOD LEFT FOREARM  Final   Special Requests   Final    BOTTLES DRAWN AEROBIC ONLY Blood Culture adequate volume   Culture   Final    NO GROWTH 5 DAYS Performed at [facility], [place]  [address] [place], [place] [address]    Report Status 08/28/2019 FINAL  Final         Radiology Studies: [place] EKG SITE RITE  Result Date: 08/27/2019 If Site Rite image not attached, placement could not be confirmed due to current cardiac rhythm.       Scheduled Meds: . amLODipine  5 mg Oral Daily  . ascorbic acid  500 mg Oral BID  . Chlorhexidine  Gluconate Cloth  6 each Topical Daily  . cholestyramine  4 g Oral BID  . escitalopram  20 mg Oral Daily  . feeding supplement  1 Container Oral TID BM  . ferrous sulfate  300 mg Oral Q breakfast  . gabapentin  300 mg Oral TID  . insulin aspart  0-9 Units Subcutaneous Q4H  . melatonin  3 mg Oral QHS  . metoprolol tartrate  50 mg Oral BID  . pantoprazole  40 mg Oral BID  . sodium chloride flush  10-40 mL Intracatheter Q12H  . sodium chloride flush  10-40 mL Intracatheter Q12H   Continuous Infusions: . sodium chloride 100 mL/hr at 08/28/19 0838  . anidulafungin 100 mg (08/28/19 1241)  . TPN ADULT (ION) 75 mL/hr at 08/28/19 0300  . TPN CYCLIC-ADULT (ION)       LOS: 11 days    Time spent: 35 minutes.     Elmarie Shiley, MD [facility]   If 7PM-7AM, please contact night-coverage [email or web address]  08/28/2019, 2:02 PM

## 2019-08-28 NOTE — Evaluation (Signed)
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 PMH includes HTN, DM, schizophrenia, bilateral BKA, hx of perforated ulcer, colostomy bag.   Clinical Impression   Pt presents with generalized weakness, impaired sitting balance, difficulty performing mobility tasks, poor skin integrity especially around sacrum, and decreased activity tolerance. Pt to benefit from acute PT to address deficits. Pt required mod-max +2 for bed mobility, sitting balance, and transfer to recliner via AP transfer. Pt requires frequent cuing for involvement in mobility, as pt is used to family doing mobility tasks for her. Pt wants to become more independent with transfers especially from w/c to car, and per pt family does not get pt out of bed unless she has an appointment or planned outing so more independence in transfers would allow her more mobility at home. PT educated pt on importance of frequent pericare and positional changes for skin integrity, pt expresses understanding. PT encouraged SNF level of care post-acutely to maximize functional independence prior to d/c home, pt declines so HHPT with 24/7 is PT recommendation. PT to progress mobility as tolerated, and will continue to follow acutely.      Follow Up Recommendations Supervision/Assistance - 24 hour;SNF (pt declines SNF recommendation, requests HHPT)    Equipment Recommendations  None recommended by PT    Recommendations for Other Services       Precautions / Restrictions Precautions Precautions: Fall Precaution Comments: chronic bilateral BKA Restrictions Weight Bearing Restrictions: No      Mobility  Bed Mobility Overal bed mobility: Needs Assistance Bed Mobility: Supine to Sit     Supine to sit: Mod assist;+2 for safety/equipment      General bed mobility comments: mod A +2 to rise into long sitting with 1 HHA, use of bedrail, and posterior trncal support. Requires min-mod assist to maintain upright sitting balance with cuing for propping  Transfers Overall transfer level: Needs assistance Equipment used: None Transfers: Comptroller transfers: Max assist;+2 physical assistance;+2 safety/equipment   General transfer comment: max A +2 to scoot back into recliner with use of pad to facilitate scooting. Cues for hand placement given but pt needing significant reminders to assist in transfers  Ambulation/Gait                Stairs            Wheelchair Mobility    Modified Rankin (Stroke Patients Only)       Balance Overall balance assessment: Needs assistance Sitting-balance support: Bilateral upper extremity supported Sitting balance-Leahy Scale: Poor Sitting balance - Comments: reliant on min-mod assist to maintain upright sitting, R lateral leaning Postural control: Right lateral lean                                   Pertinent Vitals/Pain Pain Assessment: Faces Faces Pain Scale: Hurts little more Pain Location: wound near sacrum, especially during scooting Pain Descriptors / Indicators: Sore Pain Intervention(s): Limited activity within patient's tolerance;Monitored during session;Repositioned    Home Living Family/patient expects to be discharged to:: Private residence Living Arrangements: Children Available Help at Discharge: Family;Available 24 hours/day Type of Home: Apartment Home Access: Level entry     Home Layout: One level Home Equipment: Hospital bed;Wheelchair - Education administrator (comment)  Additional Comments: Has an aide for 3 hours/day, 7 days/week. Has hoyer lift    Prior Function Level of Independence: Needs assistance   Gait / Transfers Assistance Needed: family assists with transfers to w/c   ADL's / Homemaking  Assistance Needed: daughter helps with dressing/bathing. Reports using briefs for toileting- has ostomy bag        Hand Dominance   Dominant Hand: Right    Extremity/Trunk Assessment   Upper Extremity Assessment Upper Extremity Assessment: Defer to OT evaluation LUE Deficits / Details: L hand finger contractures noted, however, baseline for pt. finger amp    Lower Extremity Assessment Lower Extremity Assessment: LLE deficits/detail;RLE deficits/detail RLE Deficits / Details: R BKA at baseline LLE Deficits / Details: L BKA at baseline     Cervical / Trunk Assessment Cervical / Trunk Assessment: Normal  Communication   Communication: No difficulties  Cognition Arousal/Alertness: Awake/alert Behavior During Therapy: WFL for tasks assessed/performed Overall Cognitive Status: History of cognitive impairments - at baseline Area of Impairment: Attention;Following commands;Safety/judgement;Problem solving                   Current Attention Level: Selective   Following Commands: Follows one step commands consistently;Follows one step commands with increased time Safety/Judgement: Decreased awareness of safety;Decreased awareness of deficits   Problem Solving: Slow processing;Requires verbal cues;Requires tactile cues General Comments: Pt follows simple commands well, at times needs PT/OT to rephrase for participation. Increased time and effort for problem solving during mobility, poor insight into deficits and carryover into every day life.      General Comments      Exercises     Assessment/Plan    PT Assessment Patient needs continued PT services  PT Problem List Decreased strength;Decreased safety awareness;Decreased mobility;Decreased activity tolerance;Decreased cognition;Decreased balance;Decreased knowledge of use of DME;Pain;Decreased skin integrity       PT Treatment Interventions DME instruction;Therapeutic activities;Therapeutic exercise;Patient/family  education;Balance training;Functional mobility training;Neuromuscular re-education    PT Goals (Current goals can be found in the Care Plan section)  Acute Rehab PT Goals Patient Stated Goal: get better at transfers (especially to car) PT Goal Formulation: With patient Time For Goal Achievement: 10/08/2019 Potential to Achieve Goals: Fair    Frequency Min 2X/week   Barriers to discharge        Co-evaluation PT/OT/SLP Co-Evaluation/Treatment: Yes Reason for Co-Treatment: For patient/therapist safety;To address functional/ADL transfers PT goals addressed during session: Mobility/safety with mobility;Balance OT goals addressed during session: ADL's and self-care;Strengthening/ROM       AM-PAC PT "6 Clicks" Mobility  Outcome Measure Help needed turning from your back to your side while in a flat bed without using bedrails?: A Little Help needed moving from lying on your back to sitting on the side of a flat bed without using bedrails?: A Lot Help needed moving to and from a bed to a chair (including a wheelchair)?: Total Help needed standing up from a chair using your arms (e.g., wheelchair or bedside chair)?: Total Help needed to walk in hospital room?: Total Help needed climbing 3-5 steps with a railing? : Total 6 Click Score: 9    End of Session   Activity Tolerance: Patient tolerated treatment well Patient left: with call bell/phone within reach;in chair;with chair alarm set Nurse Communication: Mobility status PT Visit Diagnosis: Other abnormalities of gait and mobility (R26.89);Muscle weakness (generalized) (M62.81)    Time: 2094-7096 PT Time Calculation (min) (ACUTE ONLY): 26 min   Charges:   PT Evaluation $PT Eval Low Complexity: 1  Low         Constantine Ruddick E, PT Acute Rehabilitation Services Pager [phone]  Office [phone]  Roxine Caddy D Elonda Husky 08/28/2019, 2:54 PM

## 2019-08-29 LAB — BASIC METABOLIC PANEL
Anion gap: 5 (ref 5–15)
BUN: 52 mg/dL — ABNORMAL HIGH (ref 6–20)
CO2: 18 mmol/L — ABNORMAL LOW (ref 22–32)
Calcium: 8.5 mg/dL — ABNORMAL LOW (ref 8.9–10.3)
Chloride: 111 mmol/L (ref 98–111)
Creatinine, Ser: 1.04 mg/dL — ABNORMAL HIGH (ref 0.44–1.00)
GFR calc Af Amer: >60 mL/min (ref >60)
GFR calc non Af Amer: 59 mL/min — ABNORMAL LOW (ref >60)
Glucose, Bld: 185 mg/dL — ABNORMAL HIGH (ref 70–99)
Potassium: 5.1 mmol/L (ref 3.5–5.1)
Sodium: 134 mmol/L — ABNORMAL LOW (ref 135–145)

## 2019-08-29 LAB — GLUCOSE, CAPILLARY
Glucose-Capillary: 174 mg/dL — ABNORMAL HIGH (ref 70–99)
Glucose-Capillary: 139 mg/dL — ABNORMAL HIGH (ref 70–99)
Glucose-Capillary: 212 mg/dL — ABNORMAL HIGH (ref 70–99)
Glucose-Capillary: 175 mg/dL — ABNORMAL HIGH (ref 70–99)

## 2019-08-29 LAB — MAGNESIUM: Magnesium: 2.2 mg/dL (ref 1.7–2.4)

## 2019-08-29 LAB — PHOSPHORUS: Phosphorus: 2.3 mg/dL — ABNORMAL LOW (ref 2.5–4.6)

## 2019-08-29 MED ORDER — CHOLESTYRAMINE 4 G PO PACK: 4.0000 g | PACK | Freq: Two times a day (BID) | ORAL | 12 refills | Status: AC

## 2019-08-29 MED ORDER — CASPOFUNGIN IV (FOR PTA / DISCHARGE USE ONLY)
INTRAVENOUS | 0 refills | Status: AC
Start: 2019-08-29 — End: 2019-09-09

## 2019-08-29 NOTE — Discharge Summary (Addendum)
Physician Discharge Summary  Eldine Rencher LNL:[id number] DOB: 01-17-1961 DOA: 08/17/2019  PCP: [name], MD  Admit date: 08/17/2019 Discharge date: 08/29/2019  Admitted From: Home  Disposition:  Home   Recommendations for Outpatient Follow-up:  1. Follow up with PCP in 1-2 weeks 2. Please obtain BMP/CBC in one week 3. Needs to complete Eraxis until 6/26 4. Continue with wound care  Home Health: yes.   Discharge Condition: stable.  CODE STATUS: DNR Diet recommendation: Regular   Brief/Interim Summary: 59 year old with past medical history significant for perforated prepyloric ulcer in 2020 status oratory laparotomy with [name] patch, chronic TPN via right arm PICC line, insulin-dependent diabetes, enterocutaneous fistula with colostomy bag, chronic sacral pressure ulcer stage II, hypertension, schizophrenia, bilateral BKA, PVD status post bilateral BKA sent from home for evaluation of fever.  She was admitted a few times this year for sepsis, including MRSA bacteremia, fungemia in March, Candida peritonitis in April and Proteus UTI in May.  Patient on TPN and she also takes soft diet to improve her nutrition status.  Daughter noticed that patient has had decreased appetite for the last 3 to 4 days prior to admission and was febrile.  She she was complaining of dysuria.  Per daughter when appears to be stable. She was admitted with fungemia.  1-Sepsis secondary to Candida glabrata fungemia, Secondary to PICC line, POA; Patient presented with fever, normal white count, tachycardia.  Blood cultures from 08/17/2019 1 out of 2 positive for Candida. Repeated blood cultures on 08/19/2019 continue to growth Candida. PICC line was removed, patient had a PICC line holiday cough. Repeated blood cultures on 6/12; no growth for the last 3 days. CT abdomen and pelvis show no significant changes in size fluid collection. Patient was also evaluated by ophthalmology on 08/19/2019 and endophthalmitis was  ruled out. She was a started on Eraxis. Per ID if blood cultures continue to remain negative today okay to proceed with PICC line placement. She will need to complete 2 weeks from 6/12 of Eraxis --6/26   2-History of perforated gastric ulcer status post oversewing and [place]han patch on 01/2019/enterocutaneous fistula: -CT abdomen and pelvis with persistent fluid collection within the ventral aspect of peritoneal cavity with continued fistula to the skin surface at level of umbilicus, presumably ongoing enterocutaneous fistula, no significant change in size.  Per general surgery note from 06/2019, appears developed enterocutaneous fistula postop from perforated ulcer treated with oversewing and [name] patch. Continue with Questran for watery stool Resume home TPN at discharge.   3-Diabetes mellitus type 2: hb A1c 7.0 Continue with a sliding scale Might need  to resume Lantus when back on TPN  4-Anemia/iron deficiency anemia of chronic disease: Patient noted to have a drop in hemoglobin no signs of overt bleeding.  Felt to be delusional. Received 2 unit of packed red blood cell. GI was consulted, they recommend that if patient hemoglobin dropped again patient will need a bleeding scan Continue with PPI Received IV Feraheme  5-Acute kidney injury: worsening kidney function today.  Improved with fluids. Cr down   6-Proteus mirabilis UTI; Urine culture grew more than 100 K Proteus mirabilis Received  3 days of IV antibiotics  7-Hypertension: Continue with Norvasc and Lopressor 8-Stage II pressure ulcer POA coccyx: Continue with local care.   Discharge Diagnoses:  Principal Problem:   Candidemia ([place]) Active Problems:   HTN (hypertension)   Diabetes ([place])   Anemia of chronic disease   Schizophrenia ([place])   Acute kidney injury ([place])  Sacral decubitus ulcer   Normocytic anemia   S/P BKA (below knee amputation) bilateral ([facility])   Status post colostomy ([place])   Enterocutaneous  fistula   Urinary tract infection due to Proteus   Iron deficiency anemia    Discharge Instructions  Discharge Instructions    [facility] pharmacist to adjust dose for Vancomycin, Aminoglycosides and other anti-infective therapies as requested by physician.   Complete by: As directed    [facility] to provide Cath Flo 24m   Complete by: As directed    Administer for PICC line occlusion and as ordered by physician for other access device issues.   Anaphylaxis Kit: Provided to treat any anaphylactic reaction to the medication being provided to the patient if First Dose or when requested by physician   Complete by: As directed    Epinephrine 148mml vial / amp: Administer 0.4m64m0.4ml57mubcutaneously once for moderate to severe anaphylaxis, nurse to call physician and pharmacy when reaction occurs and call 911 if needed for immediate care   Diphenhydramine 50mg87mIV vial: Administer 25-50mg 64mM PRN for first dose reaction, rash, itching, mild reaction, nurse to call physician and pharmacy when reaction occurs   Sodium Chloride 0.9% NS 500ml I59mdminister if needed for hypovolemic blood pressure drop or as ordered by physician after call to physician with anaphylactic reaction   Call MD for:  temperature >100.4   Complete by: As directed    Change dressing on IV access line weekly and PRN   Complete by: As directed    Diet - low sodium heart healthy   Complete by: As directed    Discharge wound care:   Complete by: As directed    Prior home wound   Flush IV access with Sodium Chloride 0.9% and Heparin 10 units/ml or 100 units/ml   Complete by: As directed    Home infusion instructions - [facility]   Complete by: As directed    Instructions: Flush IV access with Sodium Chloride 0.9% and Heparin 10units/ml or 100units/ml   Change dressing on IV access line: Weekly and PRN   Instructions Cath Flo 2mg: Ad64mister for PICC Line occlusion and as ordered  by physician for other access device   [facility] pharmacist to adjust dose for: Vancomycin, Aminoglycosides and other anti-infective therapies as requested by physician   Increase activity slowly   Complete by: As directed    Method of administration may be changed at the discretion of home infusion pharmacist based upon assessment of the patient and/or caregiver's ability to self-administer the medication ordered   Complete by: As directed      Allergies as of 08/29/2019      Reactions   Bee Venom Swelling   Swells up with bee stings    Metformin And Related Other (See Comments)   Upset stomach      Medication List    TAKE these medications   amLODipine 5 MG tablet Commonly known as: NORVASC Take 5 mg by mouth daily.   ascorbic acid 500 MG tablet Commonly known as: VITAMIN C Take 1 tablet (500 mg total) by mouth 2 (two) times daily.   caspofungin  IVPB Commonly known as: CANCIDAS Inject 70 mg into the vein daily for 1 day, THEN 50 mg daily for 10 days. Load of 70 mg x 1 then 50 mg daily through 09/06/19 Indication:  Candida glabrata bacteremia First Dose: Yes Last Day of Therapy:  09/06/2019 Labs - Once weekly:  CBC/D and BMP, Labs - Every other week:  ESR and CRP Method of administration: Elastomeric Method of administration may be changed at the discretion of home infusion pharmacist based upon assessment of the patient and/or caregiver's ability to self-administer the medication ordered.. Start taking on: August 29, 2019   cholestyramine 4 g packet Commonly known as: QUESTRAN Take 1 packet (4 g total) by mouth 2 (two) times daily.   ENSURE ENLIVE PO Take 237 mLs by mouth 2 (two) times daily with a meal.   escitalopram 20 MG tablet Commonly known as: LEXAPRO Take 20 mg by mouth daily.   ferrous sulfate 300 (60 Fe) MG/5ML syrup Take 5 mLs (300 mg total) by mouth daily with breakfast.   gabapentin 300 MG capsule Commonly known as: NEURONTIN Take 1  capsule (300 mg total) by mouth 2 (two) times daily.   loperamide 2 MG capsule Commonly known as: IMODIUM Take 1 capsule (2 mg total) by mouth every 8 (eight) hours as needed for diarrhea or loose stools.   Magnesium 400 MG Caps Take 400 mg by mouth 2 (two) times daily.   melatonin 3 MG Tabs tablet Take 1 tablet (3 mg total) by mouth at bedtime.   metoprolol tartrate 50 MG tablet Commonly known as: LOPRESSOR Take 1 tablet (50 mg total) by mouth 2 (two) times daily.   ondansetron 4 MG tablet Commonly known as: ZOFRAN Take 1 tablet (4 mg total) by mouth every 6 (six) hours as needed for nausea.   oxyCODONE 5 MG immediate release tablet Commonly known as: Oxy IR/ROXICODONE Take 1 tablet (5 mg total) by mouth every 6 (six) hours as needed (or pain).   pantoprazole 40 MG tablet Commonly known as: PROTONIX Take 1 tablet (40 mg total) by mouth daily.   simethicone 80 MG chewable tablet Commonly known as: MYLICON Chew 80 mg by mouth 4 (four) times daily as needed (indigestion).   sodium chloride 0.65 % Soln nasal spray Commonly known as: OCEAN Place 1 spray into both nostrils every 4 (four) hours as needed (dry nasal membranes).            Discharge Care Instructions  (From admission, onward)         Start     Ordered   08/29/19 0000  Change dressing on IV access line weekly and PRN  (Home infusion instructions - [facility] )        08/29/19 1051   08/29/19 0000  Discharge wound care:       Comments: Prior home wound   08/29/19 1051          Follow-up Information    [facility] Follow up.   Specialty: Home Health Services Why: HHPT, HHAIDE Contact information: [place] [address] [phone]        [facility] Follow up.   Why: HHRN for TPN care       [facility] Follow up.   Why: Carolynn Sayers will be following the patient for  coordination of infusion services.             Allergies   Allergen Reactions  . Bee Venom Swelling    Swells up with bee stings   . Metformin And Related Other (See Comments)    Upset stomach    Consultations:  ID   Procedures/Studies: CT ABDOMEN PELVIS W CONTRAST  Result Date: 08/18/2019 CLINICAL DATA:  Sepsis EXAM: CT ABDOMEN AND PELVIS WITH CONTRAST TECHNIQUE:  Multidetector CT imaging of the abdomen and pelvis was performed using the standard protocol following bolus administration of intravenous contrast. CONTRAST:  16m OMNIPAQUE IOHEXOL 300 MG/ML  SOLN COMPARISON:  07/05/2019 FINDINGS: Lower chest: There are trace bilateral pleural effusions with minimal bilateral dependent lower lobe atelectasis. Hepatobiliary: Periportal edema is again identified unchanged. Calcified gallstones are identified, with trace pericholecystic fluid. No gallbladder wall thickening. Pancreas: Unremarkable. No pancreatic ductal dilatation or surrounding inflammatory changes. Spleen: Normal in size without focal abnormality. Adrenals/Urinary Tract: Adrenal glands are unremarkable. Kidneys are normal, without renal calculi, focal lesion, or hydronephrosis. Bladder is unremarkable. Stomach/Bowel: No bowel obstruction or ileus. Moderate retained stool within the colon. No bowel wall thickening or inflammatory change. Vascular/Lymphatic: Aortic atherosclerosis. No enlarged abdominal or pelvic lymph nodes. Reproductive: Uterus and bilateral adnexa are unremarkable. Other: Elongated gas and fluid collection within the ventral aspect of the peritoneal cavity is again noted, without significant change in size. This measures up to approximately 9 mm in thickness. There is evidence of fistula to the skin surface at the level of the umbilicus with overlying dressing in place. The percutaneous drainage catheter seen previously has been removed in the interim. Musculoskeletal: No acute or destructive bony lesions. Reconstructed images demonstrate no additional findings. IMPRESSION: 1.  Continued fluid collection within the ventral aspect of the peritoneal cavity, with continued fistula to the skin surface at the level the umbilicus. 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 No significant change in size of the fluid collection despite removal of the percutaneous drainage catheter. 2. Stable periportal edema, nonspecific. 3. Cholelithiasis without cholecystitis. 4. Small bilateral pleural effusions. Electronically Signed   By: MRanda NgoM.D.   On: 08/18/2019 21:16   DG Chest Port 1 View  Result Date: 08/17/2019 CLINICAL DATA:  Fever of unknown origin. EXAM: PORTABLE CHEST 1 VIEW COMPARISON:  07/04/2019 FINDINGS: Cardiac silhouette is normal in size. No mediastinal or hilar masses. No evidence of adenopathy. Mild atelectasis in the medial lung bases similar to the prior study. Lungs otherwise clear. Lung volumes are low. No convincing pleural effusion and no pneumothorax. Right PICC is stable, catheter tip in the lower superior vena cava. Skeletal structures are grossly intact. IMPRESSION: No acute cardiopulmonary disease. Electronically Signed   By: DLajean ManesM.D.   On: 08/17/2019 09:42   DG Abd Portable 2V  Result Date: 08/26/2019 CLINICAL DATA:  Generalized abdominal pain. EXAM: PORTABLE ABDOMEN - 2 VIEW COMPARISON:  CT 08/18/2019. FINDINGS: EKG leads noted. Catheter noted over pelvis. Bibasilar atelectasis. No bowel distention or free air. Stool noted throughout colon. Degenerative changes scoliosis thoracolumbar spine. Degenerative changes both hips. No acute bony abnormality identified. IMPRESSION: 1.  Bibasilar atelectasis. 2. No acute intra-abdominal abnormality identified. No bowel distention. Stool noted throughout the colon. Electronically Signed   By: TMarcello Moores Register   On: 08/26/2019 11:57   ECHOCARDIOGRAM COMPLETE  Result Date: 08/18/2019    ECHOCARDIOGRAM REPORT   Patient Name:   FYOUA DELONEYDate of Exam: 08/18/2019  Medical Rec #:  [id number]      Height:       62.0 in Accession #:    [id number]     Weight:       110.0 lb Date of Birth:  [birth year]       BSA:          1.483 m Patient Age:    60years        BP:  133/66 mmHg Patient Gender: F               HR:           72 bpm. Exam Location:  Inpatient Procedure: 2D Echo, Color Doppler and Cardiac Doppler Indications:    Candidemia  History:        Patient has prior history of Echocardiogram examinations, most                 recent 05/27/2019. Risk Factors:Hypertension and Diabetes.  Sonographer:    Raquel Sarna Senior RDCS Referring Phys: [phone] A CALDWELL [place]  1. Left ventricular ejection fraction, by estimation, is 55 to 60%. The left ventricle has normal function. The left ventricle has no regional wall motion abnormalities. Left ventricular diastolic parameters are consistent with Grade I diastolic dysfunction (impaired relaxation).  2. Right ventricular systolic function is normal. The right ventricular size is normal. There is normal pulmonary artery systolic pressure.  3. Left atrial size was moderately dilated.  4. The mitral valve is abnormal. Trivial mitral valve regurgitation.  5. The aortic valve is tricuspid. Aortic valve regurgitation is not visualized.  6. The inferior vena cava is normal in size with greater than 50% respiratory variability, suggesting right atrial pressure of 3 mmHg. Comparison(s): No significant change from prior study. Conclusion(s)/Recommendation(s): No evidence of valvular vegetations on this transthoracic echocardiogram. Would recommend a transesophageal echocardiogram to exclude infective endocarditis if clinically indicated. FINDINGS  Left Ventricle: Left ventricular ejection fraction, by estimation, is 55 to 60%. The left ventricle has normal function. The left ventricle has no regional wall motion abnormalities. The left ventricular internal cavity size was normal in size. There is  no left ventricular  hypertrophy. Left ventricular diastolic parameters are consistent with Grade I diastolic dysfunction (impaired relaxation). Indeterminate filling pressures. Right Ventricle: The right ventricular size is normal. No increase in right ventricular wall thickness. Right ventricular systolic function is normal. There is normal pulmonary artery systolic pressure. The tricuspid regurgitant velocity is 1.55 m/s, and  with an assumed right atrial pressure of 3 mmHg, the estimated right ventricular systolic pressure is 11.6 mmHg. Left Atrium: Left atrial size was moderately dilated. Right Atrium: Right atrial size was normal in size. Pericardium: There is no evidence of pericardial effusion. Mitral Valve: The mitral valve is abnormal. There is mild thickening of the mitral valve leaflet(s). Trivial mitral valve regurgitation. Tricuspid Valve: The tricuspid valve is grossly normal. Tricuspid valve regurgitation is trivial. Aortic Valve: The aortic valve is tricuspid. Aortic valve regurgitation is not visualized. Pulmonic Valve: The pulmonic valve was not well visualized. Pulmonic valve regurgitation is not visualized. Aorta: The aortic root and ascending aorta are structurally normal, with no evidence of dilitation. Venous: The inferior vena cava is normal in size with greater than 50% respiratory variability, suggesting right atrial pressure of 3 mmHg. IAS/Shunts: No atrial level shunt detected by color flow Doppler.  LEFT VENTRICLE PLAX 2D LVIDd:         4.10 cm      Diastology LVIDs:         3.30 cm      LV e' lateral:   7.83 cm/s LV PW:         0.80 cm      LV E/e' lateral: 10.9 LV IVS:        0.80 cm      LV e' medial:    5.98 cm/s LVOT diam:     1.80 cm  LV E/e' medial:  14.3 LV SV:         57 LV SV Index:   39 LVOT Area:     2.54 cm  LV Volumes (MOD) LV vol d, MOD A2C: 114.0 ml LV vol d, MOD A4C: 92.9 ml LV vol s, MOD A2C: 55.5 ml LV vol s, MOD A4C: 43.1 ml LV SV MOD A2C:     58.5 ml LV SV MOD A4C:     92.9 ml LV  SV MOD BP:      60.9 ml RIGHT VENTRICLE RV S prime:     8.38 cm/s TAPSE (M-mode): 1.7 cm LEFT ATRIUM             Index       RIGHT ATRIUM          Index LA diam:        3.40 cm 2.29 cm/m  RA Area:     8.97 cm LA Vol (A2C):   48.7 ml 32.84 ml/m RA Volume:   16.80 ml 11.33 ml/m LA Vol (A4C):   62.3 ml 42.02 ml/m LA Biplane Vol: 56.9 ml 38.37 ml/m  AORTIC VALVE LVOT Vmax:   95.30 cm/s LVOT Vmean:  61.800 cm/s LVOT VTI:    0.225 m  AORTA Ao Root diam: 2.60 cm Ao Asc diam:  2.80 cm MITRAL VALVE                TRICUSPID VALVE MV Area (PHT): 3.72 cm     TR Peak grad:   9.6 mmHg MV Decel Time: 204 msec     TR Vmax:        155.00 cm/s MV E velocity: 85.30 cm/s MV A velocity: 104.00 cm/s  SHUNTS MV E/A ratio:  0.82         Systemic VTI:  0.22 m                             Systemic Diam: 1.80 cm Lyman Bishop MD Electronically signed by Lyman Bishop MD Signature Date/Time: 08/18/2019/12:56:19 PM    Final    [place] EKG SITE RITE  Result Date: 08/27/2019 If Site Rite image not attached, placement could not be confirmed due to current cardiac rhythm.  [place] EKG SITE RITE  Result Date: 08/21/2019 If Site Rite image not attached, placement could not be confirmed due to current cardiac rhythm.    Subjective: Alert, no new complaits  Discharge Exam: Vitals:   08/29/19 0850 08/29/19 1104  BP: (!) 144/70 140/63  Pulse: 76 75  Resp:  15  Temp:  98.5 F (36.9 C)  SpO2:  98%     General: Pt is alert, awake, not in acute distress Cardiovascular: RRR, S1/S2 +, no rubs, no gallops Respiratory: CTA bilaterally, no wheezing, no rhonchi Abdominal: Soft, NT, ND, bowel sounds + enterocutaneous fistula with ostomy bag in place Extremities: no edema, no cyanosis    The results of significant diagnostics from this hospitalization (including imaging, microbiology, ancillary and laboratory) are listed below for reference.     Microbiology: Recent Results (from the past 240 hour(s))  Culture, blood (routine x 2)      Status: None   Collection Time: 08/23/19 11:17 AM   Specimen: BLOOD LEFT FOREARM  Result Value Ref Range Status   Specimen Description BLOOD LEFT FOREARM  Final   Special Requests   Final    BOTTLES DRAWN AEROBIC ONLY Blood Culture adequate volume  Culture   Final    NO GROWTH 5 DAYS Performed at [facility], [place] [address] [place], [place] [address]    Report Status 08/28/2019 FINAL  Final  Culture, blood (routine x 2)     Status: None   Collection Time: 08/23/19 11:22 AM   Specimen: BLOOD LEFT FOREARM  Result Value Ref Range Status   Specimen Description BLOOD LEFT FOREARM  Final   Special Requests   Final    BOTTLES DRAWN AEROBIC ONLY Blood Culture adequate volume   Culture   Final    NO GROWTH 5 DAYS Performed at [facility], [place] [address] [place], [place] [address]    Report Status 08/28/2019 FINAL  Final     Labs: BNP (last 3 results) No results for input(s): BNP in the last 8760 hours. Basic Metabolic Panel: Recent Labs  Lab 08/25/19 0605 08/26/19 0248 08/27/19 0321 08/28/19 0500 08/29/19 0336  NA 134* 137 140 137 134*  K 3.9 3.8 3.8 4.4 5.1  CL 105 110 114* 110 111  CO2 21* 20* 18* 20* 18*  GLUCOSE 196* 199* 178* 149* 185*  BUN 38* 35* 35* 43* 52*  CREATININE 0.85 0.80 1.08* 1.53* 1.04*  CALCIUM 8.7* 9.0 9.0 8.7* 8.5*  MG 1.7 2.6* 2.4 2.4 2.2  PHOS 3.6 4.8* 5.6* 4.5 2.3*   Liver Function Tests: Recent Labs  Lab 08/25/19 0605 08/28/19 0500  AST 11* 12*  ALT 22 18  ALKPHOS 187* 145*  BILITOT 0.7 0.5  PROT 6.6 7.0  ALBUMIN 1.7* 1.9*   No results for input(s): LIPASE, AMYLASE in the last 168 hours. No results for input(s): AMMONIA in the last 168 hours. CBC: Recent Labs  Lab 08/24/19 1744 08/25/19 0605 08/26/19 0248 08/27/19 0321  WBC 8.0 7.4 7.1 7.2  NEUTROABS 4.9 5.6 3.8  --   HGB 8.7* 8.0* 8.5* 8.5*  HCT 28.1* 25.7* 26.9* 27.1*  MCV 91.8 92.1 92.1 92.2  PLT 279 311 333 350   Cardiac Enzymes: No results for  input(s): CKTOTAL, CKMB, CKMBINDEX, TROPONINI in the last 168 hours. BNP: Invalid input(s): POCBNP CBG: Recent Labs  Lab 08/28/19 1918 08/28/19 2326 08/29/19 0325 08/29/19 0835 08/29/19 1107  GLUCAP 180* 173* 174* 139* 212*   D-Dimer No results for input(s): DDIMER in the last 72 hours. Hgb A1c No results for input(s): HGBA1C in the last 72 hours. Lipid Profile No results for input(s): CHOL, HDL, LDLCALC, TRIG, CHOLHDL, LDLDIRECT in the last 72 hours. Thyroid function studies No results for input(s): TSH, T4TOTAL, T3FREE, THYROIDAB in the last 72 hours.  Invalid input(s): FREET3 Anemia work up No results for input(s): VITAMINB12, FOLATE, FERRITIN, TIBC, IRON, RETICCTPCT in the last 72 hours. Urinalysis    Component Value Date/Time   COLORURINE YELLOW 08/17/2019 0858   APPEARANCEUR CLEAR 08/17/2019 0858   LABSPEC 1.015 08/17/2019 0858   PHURINE 8.0 08/17/2019 0858   GLUCOSEU >=500 (A) 08/17/2019 0858   HGBUR LARGE (A) 08/17/2019 0858   BILIRUBINUR NEGATIVE 08/17/2019 0858   BILIRUBINUR N 04/28/2016 1603   KETONESUR NEGATIVE 08/17/2019 0858   PROTEINUR >=300 (A) 08/17/2019 0858   UROBILINOGEN 0.2 04/28/2016 1603   UROBILINOGEN 0.2 06/18/2013 0650   NITRITE NEGATIVE 08/17/2019 0858   LEUKOCYTESUR MODERATE (A) 08/17/2019 0858   Sepsis Labs Invalid input(s): PROCALCITONIN,  WBC,  LACTICIDVEN Microbiology Recent Results (from the past 240 hour(s))  Culture, blood (routine x 2)     Status: None   Collection Time: 08/23/19 11:17 AM  Specimen: BLOOD LEFT FOREARM  Result Value Ref Range Status   Specimen Description BLOOD LEFT FOREARM  Final   Special Requests   Final    BOTTLES DRAWN AEROBIC ONLY Blood Culture adequate volume   Culture   Final    NO GROWTH 5 DAYS Performed at [facility], [address] [place], [place] [address]    Report Status 08/28/2019 FINAL  Final  Culture, blood (routine x 2)     Status: None   Collection Time: 08/23/19 11:22 AM    Specimen: BLOOD LEFT FOREARM  Result Value Ref Range Status   Specimen Description BLOOD LEFT FOREARM  Final   Special Requests   Final    BOTTLES DRAWN AEROBIC ONLY Blood Culture adequate volume   Culture   Final    NO GROWTH 5 DAYS Performed at [facility], [place] [address], [place], [place] [address]    Report Status 08/28/2019 FINAL  Final     Time coordinating discharge: 40 minutes  SIGNED:   Elmarie Shiley, MD  [facility]

## 2019-08-29 NOTE — Progress Notes (Signed)
.  PHARMACY - TOTAL PARENTERAL NUTRITION CONSULT NOTE   Indication: Fistula  Patient Measurements: Height: '5\' 2"'$  (157.5 cm) Weight: 49.9 kg (110 lb) IBW/kg (Calculated) : 50.1 TPN AdjBW (KG): 49.9 Body mass index is 20.12 kg/m. Usual Weight: 53-60 kg  Assessment: 59 yo female presents on 08/17/2019 with fever, dysuria, and decreased intake. On presentation, patient found to have blood glucose of > 600 and started on insulin gtt. Prior to admission patient is on chronic cyclic TPN via R arm PICC and soft diet. TPN was initiated in 02/2019 after perforated prepyloric ulcer which was repaired with a [name] patch and continued to leak. Patient currently has an Chief Strategy Officer in place from January 2021 due to [facility] fistula.   Per patient, decreased appetite and po intake for previous 3-4 days prior to presentation. She reports transitioning from a normal diet of foods such as grits, chicken, tuna, chicken noodle soup, chips, jello, and pudding, to full liquid diet of mainly soup. Per outpatient records, she is on a 14-hour TPN cycle.   TPN was held on admission until 6/10 due to candidemia which is being treated with anidulafungin. PICC line was pulled and reinserted 6/10. Pharmacy consulted to resume TPN on 08/21/19 then blood culture from 6/8 grew 2/4 c. Glabrata and PICC was removed on 6/12 again and TPN held. Blood cultures obtained on 6/12 are still ngtd so the PICC was replaced and TPN resumed on 6/16.   Glucose / Insulin: Hx of DM (controlled by diet outpt). Hyperglycemia on admission, started on insulin gtt, then stable for days. CBGs elevated on attempt at cyclic TPN overnight on 6/10 so will stabilize on continuous TPN prior to reattempting cycling. CBGs 180-200 since TPN initiated. SSI/12h: 6 units Electrolytes: Na 134. Corrected Ca 10.1. CO2 18 << 20. Mg 2.2, Phos 2.3  Renal: SCr back down to 1.04 << 1.53 (BL 0.6-0.8). UOP 0.6 ml/kg/hr LFTs / TGs: Alk phos 145 - decrease. AST/ ALT okay. Tbili 0.5 -  decrease. TG 252 (6/14) - will watch closely. Prealbumin / albumin: Prealbumin 9.4 (6/15), albumin 1.9 << 1.7 Intake / Output; MIVF: no MIVF, stool output ~1950 cc/24h GI Imaging: 6/7 CT abd - ongoing enterocutaneous fistula, no significant change in size of fluid collection despite removng perc drainage catheter Surgeries / Procedures:   Central access: PICC removed 6/12; replaced 6/16 TPN start date: Chronic TPN prior to admission since 02/2019; Held for fungemia and PICC replacement, resumed 6/16  Nutritional Goals (per outpatient records): kCal: 1471 kcal, Protein: 93 g, Fluid: 1800 ml Goal TPN rate is 75 mL/hr (provides 90g of protein and 1476 kcals per day)  RD goals per note 6/11: KCal: 1500-1700, Protein: 65-80 g, Fluid: >/=1.5L  Current Nutrition:  Boost Breeze PO TID (3 of 3 given yesterday) Soft diet (0-50% charted as given)  Plan:  - The patient tolerated cyclic TPN over 18 hours with over hyperglycemia - Discussed with MD and plans are to discharge home today - Called to inform home health agency that makes her TPN that she will be discharging today - they are aware.  - Will not plan on making TPN for her inpatient today given her plans for discharge  Thank you for allowing pharmacy to be a part of this patient's care.  Alycia Rossetti, PharmD, BCPS Clinical Pharmacist 08/29/2019 7:36 AM   **Pharmacist phone directory can now be found on [email or web address] ([email or web address]).  Listed under [place].

## 2019-08-29 NOTE — TOC Transition Note (Signed)
Transition of Care [facility]) - CM/SW Discharge Note   Patient Details  Name: Gina [name] MRN: [id number] Date of Birth: 1961/01/26  [facility] CM/SW Contact:  Curlene Labrum, RN Phone Number: 08/29/2019, 3:56 PM   Clinical Narrative:     Case management called PTAR and schedule the patient for home.  The daughter was called and notified of the patient's transport home by ambulance.  Final next level of care: [place] Barriers to Discharge: Continued Medical Work up   Patient Goals and CMS Choice Patient states their goals for this hospitalization and ongoing recovery are:: get better CMS Medicare.gov Compare Post Acute Care list provided to:: Patient Choice offered to / list presented to : Patient  Discharge Placement                       Discharge Plan and Services   Discharge Planning Services: CM Consult Post Acute Care Choice: Home Health            DME Agency: NA       HH Arranged: PT, Nurse's Aide [place] Agency: [place] Date [place]: 08/20/19 Time [place]: 1538 Representative spoke with at [place]: [place] ([place]) Interventions     Readmission Risk Interventions Readmission Risk Prevention Plan 08/29/2019 08/19/2019 02/10/2019  Post Dischage Appt - - -  Medication Screening - - -  Transportation Screening Complete Complete Complete  PCP follow-up - - -  PCP or Specialist Appt within 3-5 Days - - Not Complete  Not Complete comments - - DC date unknown- pt established with PCP  [place] or [place] - - Complete  Social Work Consult for [place] Planning/Counseling - - Not Complete  SW consult not completed comments - - awaiting call back from daughter and APS  Palliative Care Screening - - Not Complete  Palliative Care Screening Not Complete Comments - - pending need  Medication Review (RN Care Manager) Complete Complete Referral to  Pharmacy  PCP or Specialist appointment within 3-5 days of discharge Complete - -  HRI or Home Care Consult Complete Complete -  SW Recovery Care/Counseling Consult Complete Complete -  Palliative Care Screening Complete Not Applicable -  [place] Complete Not Applicable -  Some recent data might be hidden

## 2019-08-29 NOTE — TOC Transition Note (Signed)
Transition of Care [facility]) - CM/SW Discharge Note   Patient Details  Name: Gina [name] MRN: [id number] Date of Birth: 1960/07/19  Transition of Care [facility]) CM/SW Contact:  Curlene Labrum, RN Phone Number: 08/29/2019, 11:24 AM   Clinical Narrative:    Case management spoke with Carolynn Sayers on the phone regarding the patient's discharge to home today.  Tamera Punt, RNCM with [facility] will be seeing the patient today prior to discharge to complete home health infusion teaching.  The patient will be receiving IV coordination through [facility] ([facility]) and home health RN through [facility].  Will continue to follow for discharge needs.   Final next level of care: [place] Barriers to Discharge: Continued Medical Work up   Patient Goals and CMS Choice Patient states their goals for this hospitalization and ongoing recovery are:: get better CMS Medicare.gov Compare Post Acute Care list provided to:: Patient Choice offered to / list presented to : Patient  Discharge Placement                       Discharge Plan and Services   Discharge Planning Services: CM Consult Post Acute Care Choice: Home Health            DME Agency: NA       HH Arranged: PT, Nurse's Aide [place] Agency: [place] Date [place]: 08/20/19 Time [place]: 1538 Representative spoke with at [place]: [place] ([place]) Interventions     Readmission Risk Interventions Readmission Risk Prevention Plan 08/29/2019 08/19/2019 02/10/2019  Post Dischage Appt - - -  Medication Screening - - -  Transportation Screening Complete Complete Complete  PCP follow-up - - -  PCP or Specialist Appt within 3-5 Days - - Not Complete  Not Complete comments - - DC date unknown- pt established with PCP  [place] or [place] - - Complete  Social Work Consult for [place] Planning/Counseling - - Not Complete  SW  consult not completed comments - - awaiting call back from daughter and APS  Palliative Care Screening - - Not Complete  Palliative Care Screening Not Complete Comments - - pending need  Medication Review (RN Care Manager) Complete Complete Referral to Pharmacy  PCP or Specialist appointment within 3-5 days of discharge Complete - -  HRI or Home Care Consult Complete Complete -  SW Recovery Care/Counseling Consult Complete Complete -  Palliative Care Screening Complete Not Applicable -  [place] Complete Not Applicable -  Some recent data might be hidden

## 2019-08-29 NOTE — Progress Notes (Signed)
RN contacted daughter, [name] to provide detailed verbal discharge instruction. RN answered all questions. Copy of discharge summary placed in patient discharge packet. Pt going home with PICC. Ostomy bag changed, peri care provided, and gown changed. Pt belongings placed in belongings bag and patient blue personal bag. PTAR called by CSW. RN awaiting PTAR pick up.

## 2019-08-29 NOTE — Plan of Care (Signed)

## 2019-09-11 ENCOUNTER — Other Ambulatory Visit: Payer: Self-pay

## 2019-09-11 ENCOUNTER — Inpatient Hospital Stay (HOSPITAL_COMMUNITY)
Admission: EM | Admit: 2019-09-11 | Discharge: 2019-10-12 | DRG: 377 | Disposition: E | Payer: Medicare HMO | Attending: Internal Medicine | Admitting: Internal Medicine

## 2019-09-11 ENCOUNTER — Encounter (HOSPITAL_COMMUNITY): Payer: Self-pay | Admitting: Emergency Medicine

## 2019-09-11 DIAGNOSIS — E861 Hypovolemia: Secondary | ICD-10-CM | POA: Diagnosis present

## 2019-09-11 DIAGNOSIS — L89223 Pressure ulcer of left hip, stage 3: Secondary | ICD-10-CM | POA: Diagnosis present

## 2019-09-11 DIAGNOSIS — K279 Peptic ulcer, site unspecified, unspecified as acute or chronic, without hemorrhage or perforation: Secondary | ICD-10-CM | POA: Diagnosis present

## 2019-09-11 DIAGNOSIS — R739 Hyperglycemia, unspecified: Secondary | ICD-10-CM | POA: Diagnosis present

## 2019-09-11 DIAGNOSIS — Z933 Colostomy status: Secondary | ICD-10-CM

## 2019-09-11 DIAGNOSIS — I739 Peripheral vascular disease, unspecified: Secondary | ICD-10-CM | POA: Diagnosis present

## 2019-09-11 DIAGNOSIS — E0810 Diabetes mellitus due to underlying condition with ketoacidosis without coma: Secondary | ICD-10-CM | POA: Diagnosis not present

## 2019-09-11 DIAGNOSIS — D649 Anemia, unspecified: Secondary | ICD-10-CM | POA: Diagnosis present

## 2019-09-11 DIAGNOSIS — B961 Klebsiella pneumoniae [K. pneumoniae] as the cause of diseases classified elsewhere: Secondary | ICD-10-CM | POA: Diagnosis present

## 2019-09-11 DIAGNOSIS — G894 Chronic pain syndrome: Secondary | ICD-10-CM | POA: Diagnosis present

## 2019-09-11 DIAGNOSIS — Z794 Long term (current) use of insulin: Secondary | ICD-10-CM

## 2019-09-11 DIAGNOSIS — N390 Urinary tract infection, site not specified: Secondary | ICD-10-CM | POA: Diagnosis present

## 2019-09-11 DIAGNOSIS — L89202 Pressure ulcer of unspecified hip, stage 2: Secondary | ICD-10-CM

## 2019-09-11 DIAGNOSIS — E1165 Type 2 diabetes mellitus with hyperglycemia: Secondary | ICD-10-CM | POA: Diagnosis present

## 2019-09-11 DIAGNOSIS — Z5189 Encounter for other specified aftercare: Secondary | ICD-10-CM

## 2019-09-11 DIAGNOSIS — L8993 Pressure ulcer of unspecified site, stage 3: Secondary | ICD-10-CM | POA: Diagnosis present

## 2019-09-11 DIAGNOSIS — R338 Other retention of urine: Secondary | ICD-10-CM | POA: Diagnosis not present

## 2019-09-11 DIAGNOSIS — L89213 Pressure ulcer of right hip, stage 3: Secondary | ICD-10-CM | POA: Diagnosis present

## 2019-09-11 DIAGNOSIS — Z681 Body mass index (BMI) 19 or less, adult: Secondary | ICD-10-CM

## 2019-09-11 DIAGNOSIS — D638 Anemia in other chronic diseases classified elsewhere: Secondary | ICD-10-CM | POA: Diagnosis present

## 2019-09-11 DIAGNOSIS — N179 Acute kidney failure, unspecified: Secondary | ICD-10-CM | POA: Diagnosis present

## 2019-09-11 DIAGNOSIS — R339 Retention of urine, unspecified: Secondary | ICD-10-CM | POA: Diagnosis present

## 2019-09-11 DIAGNOSIS — I10 Essential (primary) hypertension: Secondary | ICD-10-CM | POA: Diagnosis present

## 2019-09-11 DIAGNOSIS — E8809 Other disorders of plasma-protein metabolism, not elsewhere classified: Secondary | ICD-10-CM

## 2019-09-11 DIAGNOSIS — L89159 Pressure ulcer of sacral region, unspecified stage: Secondary | ICD-10-CM | POA: Diagnosis present

## 2019-09-11 DIAGNOSIS — Z89511 Acquired absence of right leg below knee: Secondary | ICD-10-CM

## 2019-09-11 DIAGNOSIS — E11649 Type 2 diabetes mellitus with hypoglycemia without coma: Secondary | ICD-10-CM | POA: Diagnosis not present

## 2019-09-11 DIAGNOSIS — K256 Chronic or unspecified gastric ulcer with both hemorrhage and perforation: Secondary | ICD-10-CM | POA: Diagnosis not present

## 2019-09-11 DIAGNOSIS — K255 Chronic or unspecified gastric ulcer with perforation: Secondary | ICD-10-CM

## 2019-09-11 DIAGNOSIS — Z89512 Acquired absence of left leg below knee: Secondary | ICD-10-CM

## 2019-09-11 DIAGNOSIS — R188 Other ascites: Secondary | ICD-10-CM | POA: Diagnosis present

## 2019-09-11 DIAGNOSIS — Z20822 Contact with and (suspected) exposure to covid-19: Secondary | ICD-10-CM | POA: Diagnosis present

## 2019-09-11 DIAGNOSIS — R636 Underweight: Secondary | ICD-10-CM | POA: Diagnosis present

## 2019-09-11 DIAGNOSIS — Z7401 Bed confinement status: Secondary | ICD-10-CM

## 2019-09-11 DIAGNOSIS — E1159 Type 2 diabetes mellitus with other circulatory complications: Secondary | ICD-10-CM | POA: Diagnosis present

## 2019-09-11 DIAGNOSIS — R509 Fever, unspecified: Secondary | ICD-10-CM | POA: Diagnosis present

## 2019-09-11 DIAGNOSIS — E871 Hypo-osmolality and hyponatremia: Secondary | ICD-10-CM | POA: Diagnosis present

## 2019-09-11 DIAGNOSIS — I9589 Other hypotension: Secondary | ICD-10-CM | POA: Diagnosis present

## 2019-09-11 DIAGNOSIS — Z79899 Other long term (current) drug therapy: Secondary | ICD-10-CM

## 2019-09-11 DIAGNOSIS — E43 Unspecified severe protein-calorie malnutrition: Secondary | ICD-10-CM | POA: Diagnosis present

## 2019-09-11 DIAGNOSIS — L89154 Pressure ulcer of sacral region, stage 4: Secondary | ICD-10-CM | POA: Diagnosis present

## 2019-09-11 DIAGNOSIS — F209 Schizophrenia, unspecified: Secondary | ICD-10-CM | POA: Diagnosis present

## 2019-09-11 DIAGNOSIS — Z89519 Acquired absence of unspecified leg below knee: Secondary | ICD-10-CM

## 2019-09-11 DIAGNOSIS — D500 Iron deficiency anemia secondary to blood loss (chronic): Secondary | ICD-10-CM | POA: Diagnosis present

## 2019-09-11 DIAGNOSIS — Z789 Other specified health status: Secondary | ICD-10-CM | POA: Diagnosis present

## 2019-09-11 DIAGNOSIS — I959 Hypotension, unspecified: Secondary | ICD-10-CM | POA: Diagnosis present

## 2019-09-11 DIAGNOSIS — K922 Gastrointestinal hemorrhage, unspecified: Secondary | ICD-10-CM | POA: Diagnosis present

## 2019-09-11 DIAGNOSIS — E162 Hypoglycemia, unspecified: Secondary | ICD-10-CM | POA: Diagnosis not present

## 2019-09-11 DIAGNOSIS — R809 Proteinuria, unspecified: Secondary | ICD-10-CM | POA: Diagnosis present

## 2019-09-11 DIAGNOSIS — L8995 Pressure ulcer of unspecified site, unstageable: Secondary | ICD-10-CM | POA: Diagnosis present

## 2019-09-11 DIAGNOSIS — K632 Fistula of intestine: Secondary | ICD-10-CM | POA: Diagnosis present

## 2019-09-11 DIAGNOSIS — K921 Melena: Secondary | ICD-10-CM | POA: Diagnosis not present

## 2019-09-11 DIAGNOSIS — L8994 Pressure ulcer of unspecified site, stage 4: Secondary | ICD-10-CM | POA: Diagnosis present

## 2019-09-11 DIAGNOSIS — E1151 Type 2 diabetes mellitus with diabetic peripheral angiopathy without gangrene: Secondary | ICD-10-CM | POA: Diagnosis present

## 2019-09-11 DIAGNOSIS — Z66 Do not resuscitate: Secondary | ICD-10-CM | POA: Diagnosis not present

## 2019-09-11 LAB — COMPREHENSIVE METABOLIC PANEL
ALT: 10 U/L (ref 0–44)
AST: 14 U/L — ABNORMAL LOW (ref 15–41)
Albumin: 1.9 g/dL — ABNORMAL LOW (ref 3.5–5.0)
Alkaline Phosphatase: 83 U/L (ref 38–126)
Anion gap: 11 (ref 5–15)
BUN: 34 mg/dL — ABNORMAL HIGH (ref 6–20)
CO2: 24 mmol/L (ref 22–32)
Calcium: 8.4 mg/dL — ABNORMAL LOW (ref 8.9–10.3)
Chloride: 96 mmol/L — ABNORMAL LOW (ref 98–111)
Creatinine, Ser: 0.9 mg/dL (ref 0.44–1.00)
GFR calc Af Amer: >60 mL/min (ref >60)
GFR calc non Af Amer: >60 mL/min (ref >60)
Glucose, Bld: 203 mg/dL — ABNORMAL HIGH (ref 70–99)
Potassium: 4.1 mmol/L (ref 3.5–5.1)
Sodium: 131 mmol/L — ABNORMAL LOW (ref 135–145)
Total Bilirubin: 0.5 mg/dL (ref 0.3–1.2)
Total Protein: 7.5 g/dL (ref 6.5–8.1)

## 2019-09-11 LAB — CBC
HCT: 24.6 % — ABNORMAL LOW (ref 36.0–46.0)
Hemoglobin: 7.7 g/dL — ABNORMAL LOW (ref 12.0–15.0)
MCH: 28.6 pg (ref 26.0–34.0)
MCHC: 31.3 g/dL (ref 30.0–36.0)
MCV: 91.4 fL (ref 80.0–100.0)
Platelets: 365 10*3/uL (ref 150–400)
RBC: 2.69 MIL/uL — ABNORMAL LOW (ref 3.87–5.11)
RDW: 15.2 % (ref 11.5–15.5)
WBC: 9.1 10*3/uL (ref 4.0–10.5)
nRBC: 0 % (ref 0.0–0.2)

## 2019-09-11 LAB — POC OCCULT BLOOD, ED: Fecal Occult Bld: POSITIVE — AB

## 2019-09-11 LAB — CBG MONITORING, ED: Glucose-Capillary: 155 mg/dL — ABNORMAL HIGH (ref 70–99)
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
## 2019-09-11 NOTE — ED Notes (Signed)
CBG 155 

## 2019-09-11 NOTE — ED Triage Notes (Signed)
Pt reports being sent here for low hgb. Denies any bleeding. Has a PICC line in right arm for TPN.

## 2019-09-11 NOTE — H&P (Signed)
History and Physical  Gina [name] EGB:[id number] DOB: 1960-09-30 DOA: 09/12/2019  Referring physician: Kennyth Lose MD  PCP: [name], MD  Patient coming from: Home  Chief Complaint: Low hemoglobin  HPI: Gina [name] is a 59 y.o. female with medical history significant for perforated prepyloric ulcer in 2020 s/p  exploratory laparotomy with [name] patch, chronic TPN via right arm PICC, IDDM, bilateral lower quadrant drains along with enterocutaneous fistula with colostomy bag, Chronic sacral pressure ulcers stage II, HTN, schizophrenia, bilateral BKA, PVD s/p B/L BKA,  sent from from home for evaluation of fever. She was admitted few times this year for sepsis, including MRSA bacteremia fungemia in March, Candida peritonitis in April and Proteus UTI in May.  Pt on TPN and she also takes soft diet to improve her nutrition condition.   Patient was unable to provide history (she states that she was too tired and that all she knows was that she was sent to the ED due to low hemoglobin), history was obtained from ED physician and ED medical record.  Per report, presents to the ED from PCPs office due to hypotension with SBP in the 60s per medical record, patient presented with nausea and nonbloody vomiting which has since resolved this morning.  She denies fever, chills, painful urination,abdominal pain.  Has a history of recurrent anemia requiring transfusion with last admission being about 3 weeks ago due to hemoglobin of 6.6 at that time.  Patient denies gross blood in feces/colostomy.  ED Course:  In the ED emergency department, he was hemodynamically stable.  Work-up in the ED showed hemoglobin of 7.7 (hemoglobin was 8.5 about 2 weeks ago), hyponatremia, hyperglycemia.  Fecal occult blood was noted to be positive.  SARS coronavirus 2 was negative.  GI was consulted Gina [name]) and will see patient in the morning per ED physician.  Hospitalist was asked to admit patient for further  evaluation management.  Review of Systems: Constitutional: Negative for chills and fever.  HENT: Negative for ear pain and sore throat.   Eyes: Negative for pain and visual disturbance.  Respiratory: Negative for cough, chest tightness and shortness of breath.   Cardiovascular: Negative for chest pain and palpitations.  Gastrointestinal: nausea and vomiting (resolved). Negative for abdominal pain and vomiting.  Endocrine: Negative for polyphagia and polyuria.  Genitourinary: Negative for decreased urine volume, dysuria, enuresis, hematuria, vaginal discharge and vaginal pain.  Musculoskeletal: Negative for arthralgias and back pain.  Skin: Negative for color change and rash.  Allergic/Immunologic: Negative for immunocompromised state.  Neurological: Negative for tremors, syncope, speech difficulty, weakness, light-headedness and headaches.  Hematological: Does not bruise/bleed easily.   Past Medical History:  Diagnosis Date  . Diabetes mellitus   . Hypertension   . Osteomyelitis of right foot ([place]) 02/22/2017  . Perforated gastric ulcer ([place])   . S/P bilateral BKA (below knee amputation) ([place])   . Schizophrenia ([place])   . Schizophrenia ([place])   . Septic arthritis of interphalangeal joint of toe of left foot ([place]) 06/02/2016  . Stress incontinence 04/26/2017   Past Surgical History:  Procedure Laterality Date  . AMPUTATION Left 06/05/2016   Procedure: LEFT FOOT TRANSMETATARSAL AMPUTATION;  Surgeon: [name], MD;  Location: [facility];  Service: Orthopedics;  Laterality: Left;  . AMPUTATION Right 11/11/2017   Procedure: AMPUTATION BELOW KNEE;  Surgeon: [name], MD;  Location: [place];  Service: Orthopedics;  Laterality: Right;  . AMPUTATION Left 05/10/2018   Procedure: LEFT BELOW KNEE AMPUTATION;  Surgeon:  [name], MD;  Location: [place];  Service: Orthopedics;  Laterality: Left;  . AMPUTATION Left 12/28/2018   Procedure: AMPUTATION THIRD DIGIT;  Surgeon: Iran Planas, MD;   Location: [facility];  Service: Orthopedics;  Laterality: Left;  . AMPUTATION TOE Right 02/23/2017   Procedure: AMPUTATION RIGHT THIRD TOE;  Surgeon: [name], MD;  Location: [place];  Service: Orthopedics;  Laterality: Right;  . ESOPHAGOGASTRODUODENOSCOPY (EGD) WITH PROPOFOL N/A 07/24/2019   Procedure: ESOPHAGOGASTRODUODENOSCOPY (EGD) WITH PROPOFOL;  Surgeon: Wilford Corner, MD;  [facility];  Service: Endoscopy;  Laterality: N/A;  . I & D EXTREMITY Right 11/09/2017   Procedure: Debride Ulcer Right Heel;  Surgeon: [name], MD;  Location: [place];  Service: Orthopedics;  Laterality: Right;  . I & D EXTREMITY Left 12/28/2018   Procedure: IRRIGATION AND DEBRIDEMENT EXTREMITY;  Surgeon: Iran Planas, MD;  Location: [facility];  Service: Orthopedics;  Laterality: Left;  . INCISION AND DRAINAGE OF WOUND Left 12/26/2018   Procedure: IRRIGATION AND DEBRIDEMENT LEFT HAND;  Surgeon: Iran Planas, MD;  Location: [facility];  Service: Orthopedics;  Laterality: Left;  . LAPAROTOMY N/A 02/02/2019   Procedure: EXPLORATORY LAPAROTOMY WITH OVERSEW OF DUODENAL ULCER;  Surgeon: Alphonsa Overall, MD;  Location: [facility];  Service: General;  Laterality: N/A;  . TEE WITHOUT CARDIOVERSION N/A 05/27/2019   Procedure: TRANSESOPHAGEAL ECHOCARDIOGRAM (TEE);  Surgeon: Skeet Latch, MD;  Location: [place];  Service: Cardiovascular;  Laterality: N/A;  . TUBAL LIGATION      Social History:  reports that she has never smoked. She has never used smokeless tobacco. She reports that she does not drink alcohol and does not use drugs.   Allergies  Allergen Reactions  . Bee Venom Swelling    Swells up with bee stings   . Metformin And Related Other (See Comments)    Upset stomach    Family History  Problem Relation Age of Onset  . Diabetes type II Father   . CAD Father   . Prostate cancer Father   . Diabetes Mellitus II Brother       Prior to Admission medications   Medication Sig Start Date End Date  Taking? Authorizing Provider  amLODipine (NORVASC) 5 MG tablet Take 5 mg by mouth daily.   Yes [provider]  ascorbic acid (VITAMIN C) 500 MG tablet Take 1 tablet (500 mg total) by mouth 2 (two) times daily. 06/10/19  Yes Shelly Coss, MD  cholestyramine (QUESTRAN) 4 g packet Take 1 packet (4 g total) by mouth 2 (two) times daily. 08/29/19  Yes Regalado, Belkys A, MD  escitalopram (LEXAPRO) 20 MG tablet Take 20 mg by mouth daily.   Yes [provider]  gabapentin (NEURONTIN) 300 MG capsule Take 1 capsule (300 mg total) by mouth 2 (two) times daily. 06/10/19  Yes Shelly Coss, MD  loperamide (IMODIUM) 2 MG capsule Take 1 capsule (2 mg total) by mouth every 8 (eight) hours as needed for diarrhea or loose stools. 07/07/19  Yes Regalado, Belkys A, MD  Magnesium 400 MG CAPS Take 400 mg by mouth 2 (two) times daily. 07/07/19  Yes Regalado, Belkys A, MD  melatonin 3 MG TABS tablet Take 1 tablet (3 mg total) by mouth at bedtime. 06/10/19  Yes Shelly Coss, MD  metoprolol tartrate (LOPRESSOR) 50 MG tablet Take 1 tablet (50 mg total) by mouth 2 (two) times daily. 06/10/19  Yes Shelly Coss, MD  Nutritional Supplements (ENSURE ENLIVE PO) Take 237 mLs by mouth  2 (two) times daily with a meal.   Yes [provider]  ondansetron (ZOFRAN) 4 MG tablet Take 1 tablet (4 mg total) by mouth every 6 (six) hours as needed for nausea. 06/10/19  Yes Shelly Coss, MD  oxyCODONE (OXY IR/ROXICODONE) 5 MG immediate release tablet Take 1 tablet (5 mg total) by mouth every 6 (six) hours as needed (or pain). 06/10/19  Yes Shelly Coss, MD  pantoprazole (PROTONIX) 40 MG tablet Take 1 tablet (40 mg total) by mouth daily. 06/10/19  Yes Shelly Coss, MD  simethicone (MYLICON) 80 MG chewable tablet Chew 80 mg by mouth 4 (four) times daily as needed (indigestion).    Yes [provider]  sodium chloride (OCEAN) 0.65 % SOLN nasal spray Place 1 spray into both nostrils every 4 (four) hours  as needed (dry nasal membranes).   Yes [provider]  ferrous sulfate 300 (60 Fe) MG/5ML syrup Take 5 mLs (300 mg total) by mouth daily with breakfast. Patient not taking: Reported on 09/15/2019 07/07/19   Elmarie Shiley, MD    Physical Exam: BP (!) 148/75 (BP Location: Left Arm)   Pulse 78   Temp 98.6 F (37 C) (Oral)   Resp 18   Ht 5\' 2"  (1.575 m)   Wt 45.4 kg   LMP 03/29/2015   SpO2 100%   BMI 18.29 kg/m   . General: 59 y.o. year-old female well developed well nourished in no acute distress.  Alert and oriented x3. [name] HEENT: Normocephalic, atraumatic. PERRLA . Neck: Supple trachea medial . Cardiovascular: Regular rate and rhythm with no rubs or gallops.  No thyromegaly or JVD noted.   [name] Respiratory: Clear to auscultation with no wheezes or rales. Good inspiratory effort. . Abdomen: Soft nontender nondistended with normal bowel sounds x4 quadrants, colostomy bag with soft yellow stool noted. . Muskuloskeletal: Bilateral BKA.  Right PICC line noted no cyanosis or clubbing noted bilaterally . Neuro: CN II-XII intact, strength, sensation, reflexes . Skin: Stage II sacral ulcer with clean base.  No rash or tenderness around PICC site. Psychiatry: Judgement and insight appear normal. Mood is appropriate for condition and setting          Labs on Admission:  Basic Metabolic Panel: Recent Labs  Lab 10/08/2019 1531  NA 131*  K 4.1  CL 96*  CO2 24  GLUCOSE 203*  BUN 34*  CREATININE 0.90  CALCIUM 8.4*   Liver Function Tests: Recent Labs  Lab 09/15/2019 1531  AST 14*  ALT 10  ALKPHOS 83  BILITOT 0.5  PROT 7.5  ALBUMIN 1.9*   No results for input(s): LIPASE, AMYLASE in the last 168 hours. No results for input(s): AMMONIA in the last 168 hours. CBC: Recent Labs  Lab 10/11/2019 1531  WBC 9.1  HGB 7.7*  HCT 24.6*  MCV 91.4  PLT 365   Cardiac Enzymes: No results for input(s): CKTOTAL, CKMB, CKMBINDEX, TROPONINI in the last 168 hours.  BNP (last 3  results) No results for input(s): BNP in the last 8760 hours.  ProBNP (last 3 results) No results for input(s): PROBNP in the last 8760 hours.  CBG: Recent Labs  Lab 10/02/2019 1933  GLUCAP 155*    Radiological Exams on Admission: No results found.  EKG: I independently viewed the EKG done and my findings are as followed: EKG was not done in the ED  Assessment/Plan Present on Admission: . Acute GI bleeding . Hyperglycemia . HTN (hypertension) . Sacral decubitus ulcer . Anemia of  chronic disease . Perforated gastric ulcer ([place])  Principal Problem:   Acute GI bleeding Active Problems:   Hyperglycemia   HTN (hypertension)   Diabetes ([place])   Anemia of chronic disease   Sacral decubitus ulcer   Perforated gastric ulcer ([place])   S/P BKA (below knee amputation) bilateral ([facility])   Hypoalbuminemia   Acute GI bleed H/H= 7.7/24.6, this was 8.5/27.1 on 08/27/2019 Continue IV Protonix drip Gastroenterologist [facility]) was consulted and will see patient in the morning per ED physician Consider blood transfusion if hemoglobin drops below 7. severe presacral moderation  Hyperglycemia secondary to type II DM  Continue insulin sliding scale and hypoglycemia protocol  Essential hypertension Continue amlodipine and Lopressor when med rec is updated  Sacral decubitus ulcer Continue wound care  Anemia of chronic disease  Patient presented with positive occult blood  Hemoglobin lower than 2 weeks ago. Consider transfusion if H&H drop below 7  History of perforated gastric ulcer-status post [name] patch. On IV Protonix   Hypoalbuminemia secondary to severe protein calorie malnutrition Patient appears to be on TPN diet, patient will be consulted for management  Acute febrile illness Several hours after patient was admitted, RN called to report a afebrile episode with a temperature of 102.36F(rectal).  No obvious source none at this time. Patient was started on Tylenol 650 mg  p.o. every 6 hours as needed Blood culture was obtained.  Chronic pain due to bilateral BKA Continue oxycodone and gabapentin per home regimen  DVT prophylaxis: SCDs  Code Status: Full code (patient states that she will need to reconsider DNR status she had during last admission, she was told that she will placed on full code at this time and that she could notify patient/nurse if she decides to attempt to be DNR).  Family Communication: None at bedside  Disposition Plan:  Patient is from:                        home Anticipated DC to:                   SNF or family members home Anticipated DC date:                Possibly 24 hours Anticipated DC barriers:           GI consult and recommendation   Consults called: GI Gina [name]) per ED physician  Admission status: Observation    Bernadette Hoit MD [facility]  If 7PM-7AM, please contact night-coverage [email or web address]  09/12/2019, 2:10 AM

## 2019-09-12 ENCOUNTER — Observation Stay (HOSPITAL_COMMUNITY): Payer: Medicare HMO

## 2019-09-12 DIAGNOSIS — E1159 Type 2 diabetes mellitus with other circulatory complications: Secondary | ICD-10-CM | POA: Diagnosis present

## 2019-09-12 DIAGNOSIS — R339 Retention of urine, unspecified: Secondary | ICD-10-CM | POA: Diagnosis present

## 2019-09-12 DIAGNOSIS — E1151 Type 2 diabetes mellitus with diabetic peripheral angiopathy without gangrene: Secondary | ICD-10-CM | POA: Diagnosis present

## 2019-09-12 DIAGNOSIS — Z20822 Contact with and (suspected) exposure to covid-19: Secondary | ICD-10-CM | POA: Diagnosis present

## 2019-09-12 DIAGNOSIS — K632 Fistula of intestine: Secondary | ICD-10-CM | POA: Diagnosis present

## 2019-09-12 DIAGNOSIS — D638 Anemia in other chronic diseases classified elsewhere: Secondary | ICD-10-CM | POA: Diagnosis present

## 2019-09-12 DIAGNOSIS — Z89511 Acquired absence of right leg below knee: Secondary | ICD-10-CM | POA: Diagnosis not present

## 2019-09-12 DIAGNOSIS — D649 Anemia, unspecified: Secondary | ICD-10-CM | POA: Diagnosis present

## 2019-09-12 DIAGNOSIS — L89159 Pressure ulcer of sacral region, unspecified stage: Secondary | ICD-10-CM | POA: Diagnosis not present

## 2019-09-12 DIAGNOSIS — Z7401 Bed confinement status: Secondary | ICD-10-CM | POA: Diagnosis not present

## 2019-09-12 DIAGNOSIS — E0810 Diabetes mellitus due to underlying condition with ketoacidosis without coma: Secondary | ICD-10-CM | POA: Diagnosis not present

## 2019-09-12 DIAGNOSIS — E11649 Type 2 diabetes mellitus with hypoglycemia without coma: Secondary | ICD-10-CM | POA: Diagnosis not present

## 2019-09-12 DIAGNOSIS — L89152 Pressure ulcer of sacral region, stage 2: Secondary | ICD-10-CM | POA: Diagnosis not present

## 2019-09-12 DIAGNOSIS — E8809 Other disorders of plasma-protein metabolism, not elsewhere classified: Secondary | ICD-10-CM | POA: Diagnosis present

## 2019-09-12 DIAGNOSIS — K921 Melena: Secondary | ICD-10-CM | POA: Diagnosis present

## 2019-09-12 DIAGNOSIS — Z66 Do not resuscitate: Secondary | ICD-10-CM | POA: Diagnosis not present

## 2019-09-12 DIAGNOSIS — K256 Chronic or unspecified gastric ulcer with both hemorrhage and perforation: Secondary | ICD-10-CM | POA: Diagnosis present

## 2019-09-12 DIAGNOSIS — R509 Fever, unspecified: Secondary | ICD-10-CM | POA: Diagnosis not present

## 2019-09-12 DIAGNOSIS — L89154 Pressure ulcer of sacral region, stage 4: Secondary | ICD-10-CM | POA: Diagnosis present

## 2019-09-12 DIAGNOSIS — I10 Essential (primary) hypertension: Secondary | ICD-10-CM | POA: Diagnosis present

## 2019-09-12 DIAGNOSIS — K922 Gastrointestinal hemorrhage, unspecified: Secondary | ICD-10-CM | POA: Diagnosis not present

## 2019-09-12 DIAGNOSIS — Z89512 Acquired absence of left leg below knee: Secondary | ICD-10-CM | POA: Diagnosis not present

## 2019-09-12 DIAGNOSIS — Z79899 Other long term (current) drug therapy: Secondary | ICD-10-CM | POA: Diagnosis not present

## 2019-09-12 DIAGNOSIS — N390 Urinary tract infection, site not specified: Secondary | ICD-10-CM | POA: Diagnosis present

## 2019-09-12 DIAGNOSIS — E871 Hypo-osmolality and hyponatremia: Secondary | ICD-10-CM | POA: Diagnosis present

## 2019-09-12 DIAGNOSIS — F209 Schizophrenia, unspecified: Secondary | ICD-10-CM | POA: Diagnosis present

## 2019-09-12 DIAGNOSIS — E1165 Type 2 diabetes mellitus with hyperglycemia: Secondary | ICD-10-CM | POA: Diagnosis present

## 2019-09-12 DIAGNOSIS — E43 Unspecified severe protein-calorie malnutrition: Secondary | ICD-10-CM | POA: Diagnosis present

## 2019-09-12 DIAGNOSIS — Z681 Body mass index (BMI) 19 or less, adult: Secondary | ICD-10-CM | POA: Diagnosis not present

## 2019-09-12 DIAGNOSIS — N179 Acute kidney failure, unspecified: Secondary | ICD-10-CM | POA: Diagnosis present

## 2019-09-12 DIAGNOSIS — L89213 Pressure ulcer of right hip, stage 3: Secondary | ICD-10-CM | POA: Diagnosis present

## 2019-09-12 DIAGNOSIS — L89223 Pressure ulcer of left hip, stage 3: Secondary | ICD-10-CM | POA: Diagnosis present

## 2019-09-12 LAB — CBC
HCT: 25.9 % — ABNORMAL LOW (ref 36.0–46.0)
Hemoglobin: 8 g/dL — ABNORMAL LOW (ref 12.0–15.0)
MCH: 29 pg (ref 26.0–34.0)
MCHC: 30.9 g/dL (ref 30.0–36.0)
MCV: 93.8 fL (ref 80.0–100.0)
Platelets: 363 10*3/uL (ref 150–400)
RBC: 2.76 MIL/uL — ABNORMAL LOW (ref 3.87–5.11)
RDW: 15.4 % (ref 11.5–15.5)
WBC: 9.9 10*3/uL (ref 4.0–10.5)
nRBC: 0 % (ref 0.0–0.2)

## 2019-09-12 LAB — URINALYSIS, ROUTINE W REFLEX MICROSCOPIC
Bilirubin Urine: NEGATIVE
Glucose, UA: 150 mg/dL — AB
Ketones, ur: NEGATIVE mg/dL
Leukocytes,Ua: NEGATIVE
Nitrite: NEGATIVE
Protein, ur: >=300 mg/dL — AB
Specific Gravity, Urine: 1.015 (ref 1.005–1.030)
pH: 5 (ref 5.0–8.0)

## 2019-09-12 LAB — GLUCOSE, CAPILLARY
Glucose-Capillary: 115 mg/dL — ABNORMAL HIGH (ref 70–99)
Glucose-Capillary: 141 mg/dL — ABNORMAL HIGH (ref 70–99)
Glucose-Capillary: 93 mg/dL (ref 70–99)

## 2019-09-12 LAB — IRON AND TIBC
Iron: 16 ug/dL — ABNORMAL LOW (ref 28–170)
Saturation Ratios: 15 % (ref 10.4–31.8)
TIBC: 105 ug/dL — ABNORMAL LOW (ref 250–450)
UIBC: 89 ug/dL

## 2019-09-12 LAB — CBG MONITORING, ED
Glucose-Capillary: 98 mg/dL (ref 70–99)
Glucose-Capillary: 99 mg/dL (ref 70–99)

## 2019-09-12 LAB — COMPREHENSIVE METABOLIC PANEL
ALT: 9 U/L (ref 0–44)
AST: 13 U/L — ABNORMAL LOW (ref 15–41)
Albumin: 1.7 g/dL — ABNORMAL LOW (ref 3.5–5.0)
Alkaline Phosphatase: 73 U/L (ref 38–126)
Anion gap: 8 (ref 5–15)
BUN: 32 mg/dL — ABNORMAL HIGH (ref 6–20)
CO2: 27 mmol/L (ref 22–32)
Calcium: 8.3 mg/dL — ABNORMAL LOW (ref 8.9–10.3)
Chloride: 96 mmol/L — ABNORMAL LOW (ref 98–111)
Creatinine, Ser: 1.01 mg/dL — ABNORMAL HIGH (ref 0.44–1.00)
GFR calc Af Amer: >60 mL/min (ref >60)
GFR calc non Af Amer: >60 mL/min (ref >60)
Glucose, Bld: 106 mg/dL — ABNORMAL HIGH (ref 70–99)
Potassium: 4 mmol/L (ref 3.5–5.1)
Sodium: 131 mmol/L — ABNORMAL LOW (ref 135–145)
Total Bilirubin: 0.6 mg/dL (ref 0.3–1.2)
Total Protein: 6.9 g/dL (ref 6.5–8.1)

## 2019-09-12 LAB — PROTIME-INR
INR: 1.2 (ref 0.8–1.2)
Prothrombin Time: 14.7 seconds (ref 11.4–15.2)

## 2019-09-12 LAB — PROCALCITONIN: Procalcitonin: 0.25 ng/mL

## 2019-09-12 LAB — SARS CORONAVIRUS 2 BY RT PCR (HOSPITAL ORDER, PERFORMED IN ~~LOC~~ HOSPITAL LAB): SARS Coronavirus 2: NEGATIVE

## 2019-09-12 LAB — MAGNESIUM: Magnesium: 1.9 mg/dL (ref 1.7–2.4)

## 2019-09-12 LAB — PHOSPHORUS: Phosphorus: 3.7 mg/dL (ref 2.5–4.6)

## 2019-09-12 LAB — APTT: aPTT: 37 seconds — ABNORMAL HIGH (ref 24–36)

## 2019-09-12 MED ORDER — CHOLESTYRAMINE 4 G PO PACK
4.0000 g | PACK | Freq: Two times a day (BID) | ORAL | Status: DC
  Administered 2019-09-13 – 2019-10-02 (×17): 4 g via ORAL
  Filled 2019-09-12 (×44): qty 1

## 2019-09-12 MED ORDER — PANTOPRAZOLE SODIUM 40 MG IV SOLR: 40.0000 mg | Freq: Two times a day (BID) | INTRAVENOUS | Status: DC

## 2019-09-12 MED ORDER — GABAPENTIN 300 MG PO CAPS
300.0000 mg | ORAL_CAPSULE | Freq: Two times a day (BID) | ORAL | Status: DC
  Administered 2019-09-13 – 2019-10-02 (×36): 300 mg via ORAL
  Filled 2019-09-12 (×41): qty 1

## 2019-09-12 MED ORDER — METOPROLOL TARTRATE 50 MG PO TABS
50.0000 mg | ORAL_TABLET | Freq: Two times a day (BID) | ORAL | Status: DC
  Administered 2019-09-12 – 2019-10-02 (×37): 50 mg via ORAL
  Filled 2019-09-12 (×15): qty 1
  Filled 2019-09-12: qty 2
  Filled 2019-09-12 (×26): qty 1

## 2019-09-12 MED ORDER — CYANOCOBALAMIN 1000 MCG/ML IJ SOLN
1000.0000 ug | Freq: Once | INTRAMUSCULAR | Status: DC
  Filled 2019-09-12: qty 1

## 2019-09-12 MED ORDER — PANTOPRAZOLE SODIUM 40 MG IV SOLR
40.0000 mg | Freq: Two times a day (BID) | INTRAVENOUS | Status: DC
  Administered 2019-09-12 – 2019-09-27 (×30): 40 mg via INTRAVENOUS
  Filled 2019-09-12 (×30): qty 40

## 2019-09-12 MED ORDER — SODIUM CHLORIDE 0.9 % IV SOLN: INTRAVENOUS | Status: DC

## 2019-09-12 MED ORDER — ONDANSETRON HCL 4 MG/2ML IJ SOLN
4.0000 mg | Freq: Four times a day (QID) | INTRAMUSCULAR | Status: DC | PRN
  Administered 2019-09-12 – 2019-10-01 (×15): 4 mg via INTRAVENOUS
  Filled 2019-09-12 (×17): qty 2

## 2019-09-12 MED ORDER — OXYCODONE HCL 5 MG PO TABS
5.0000 mg | ORAL_TABLET | Freq: Four times a day (QID) | ORAL | Status: DC | PRN
  Administered 2019-09-12 – 2019-10-01 (×16): 5 mg via ORAL
  Filled 2019-09-12 (×16): qty 1

## 2019-09-12 MED ORDER — SODIUM CHLORIDE 0.9 % IV SOLN
200.0000 mg | Freq: Once | INTRAVENOUS | Status: DC
  Filled 2019-09-12: qty 200

## 2019-09-12 MED ORDER — ACETAMINOPHEN 325 MG PO TABS
650.0000 mg | ORAL_TABLET | Freq: Four times a day (QID) | ORAL | Status: DC | PRN
  Administered 2019-09-12 – 2019-10-01 (×4): 650 mg via ORAL
  Filled 2019-09-12 (×5): qty 2

## 2019-09-12 MED ORDER — SODIUM CHLORIDE 0.9 % IV SOLN
80.0000 mg | Freq: Once | INTRAVENOUS | Status: AC
  Administered 2019-09-12: 80 mg via INTRAVENOUS
  Filled 2019-09-12: qty 80

## 2019-09-12 MED ORDER — INSULIN ASPART 100 UNIT/ML ~~LOC~~ SOLN
0.0000 [IU] | Freq: Every day | SUBCUTANEOUS | Status: DC
  Administered 2019-09-14 – 2019-09-15 (×2): 2 [IU] via SUBCUTANEOUS

## 2019-09-12 MED ORDER — IOHEXOL 300 MG/ML  SOLN
100.0000 mL | Freq: Once | INTRAMUSCULAR | Status: AC | PRN
  Administered 2019-09-12: 100 mL via INTRAVENOUS

## 2019-09-12 MED ORDER — SODIUM CHLORIDE 0.9 % IV SOLN
8.0000 mg/h | INTRAVENOUS | Status: DC
  Administered 2019-09-12: 8 mg/h via INTRAVENOUS
  Filled 2019-09-12 (×3): qty 80

## 2019-09-12 MED ORDER — INSULIN ASPART 100 UNIT/ML ~~LOC~~ SOLN
0.0000 [IU] | Freq: Three times a day (TID) | SUBCUTANEOUS | Status: DC
  Administered 2019-09-13: 2 [IU] via SUBCUTANEOUS
  Administered 2019-09-14: 3 [IU] via SUBCUTANEOUS
  Administered 2019-09-14: 2 [IU] via SUBCUTANEOUS
  Administered 2019-09-15: 5 [IU] via SUBCUTANEOUS
  Administered 2019-09-15: 2 [IU] via SUBCUTANEOUS
  Administered 2019-09-16: 5 [IU] via SUBCUTANEOUS

## 2019-09-12 MED ORDER — ALUM & MAG HYDROXIDE-SIMETH 200-200-20 MG/5ML PO SUSP
30.0000 mL | Freq: Once | ORAL | Status: AC
  Administered 2019-09-12: 30 mL via ORAL
  Filled 2019-09-12: qty 30

## 2019-09-12 MED ORDER — AMLODIPINE BESYLATE 5 MG PO TABS: 5.0000 mg | ORAL_TABLET | Freq: Every day | ORAL | Status: DC

## 2019-09-12 MED ORDER — BOOST / RESOURCE BREEZE PO LIQD CUSTOM
1.0000 | Freq: Three times a day (TID) | ORAL | Status: DC
  Administered 2019-09-12 – 2019-09-19 (×17): 1 via ORAL

## 2019-09-12 MED ORDER — SODIUM CHLORIDE 0.9 % IV SOLN
100.0000 mg | INTRAVENOUS | Status: DC
  Administered 2019-09-13 – 2019-09-14 (×2): 100 mg via INTRAVENOUS
  Filled 2019-09-12 (×3): qty 100

## 2019-09-12 NOTE — Progress Notes (Signed)
Received call from Dr. Tyrell Antonio about patient.  Complex surgical history status post exploratory laparotomy November 2020 by Dr. Alphonsa Overall for perforated peptic ulcer disease.  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  She is not a candidate for surgical reversal due to poor overall medical condition severe protein calorie malnutrition.  She had a controlled fistula with an Eakin's pouch for the last 7 months.  She has had multiple CT scans.  Today's scan was done which I was asked to review.  The fistula still present.  There is minimal change with with more maturity of the sinus fistula tract which explains the thickness.  There is no undrained fluid collection that I can see.  She was admitted for fever and GI bleeding.  She is not an operative candidate due to her severe protein calorie malnutrition.  Most likely source of fever will be central venous access in her case given her need for TPN.  [facility] will follow her while in house but unfortunately not much else to offer currently for treatment of fever or GI bleeding.  She does take p.o. intake as well.

## 2019-09-12 NOTE — ED Notes (Signed)
Pt was cleaned and new linen applied to bed. Pt rectal temp 102.2. Purewick applied and hooked to suction canister.

## 2019-09-12 NOTE — ED Notes (Signed)
MS Breakfast Ordered 

## 2019-09-12 NOTE — Progress Notes (Addendum)
PROGRESS NOTE    Gina [name]  EXH:[id number] DOB: 1960/10/29 DOA: 10/10/2019 PCP: [name], MD   Brief Narrative: 59 year old with past medical history significant for perforated prepyloric ulcer in 2020 status post exploratory laparotomy with [name] patch, chronic TPN via right arm PICC line, insulin-dependent diabetes, history off bilateral lower quadrant drains along with enterocutaneous fistula with colostomy bag, chronic sacral pressure ulcer stage II, schizophrenia, bilateral BKA who presents for evaluation of fever.  She has been admitted a few times this year for sepsis including MRSA bacteremia, fungemia, Candida peritonitis in April, Proteus UTI, Candida glabrata who completed Eraxis 6/26.  Presents with persistent fevers and low hemoglobin. Patient report that she was sent to the ED due to low hemoglobin.  Patient was sent from PCP office due to hypotension systolic blood pressure in the 60s, patient had an episode of nausea and vomiting.  In the ED her hemoglobin was stable around 7.7, hyponatremia, hyperglycemia.  Fecal occult was noted to be positive.  SIRS coronavirus 2 -.    Assessment & Plan:   Principal Problem:   Acute GI bleeding Active Problems:   Hyperglycemia   HTN (hypertension)   Diabetes (HCC)   Anemia of chronic disease   Sacral decubitus ulcer   Perforated gastric ulcer (HCC)   S/P BKA (below knee amputation) bilateral (HCC)   Hypoalbuminemia   Fever; unclear source: -Repeated blood cultures, UA, urine culture. -ID consulted plan to start Eraxis awaiting blood cultures result. -Chest x-ray: No acute abnormality -CT Abdomen and pelvis:Similar extent but increased fullness of the chronic intra-abdominal collection associated with the known enterocutaneous fistula. 1 cm low-density in the subcapsular central liver that is new from March 2021 and very similar to June 2021 CT, would expect more evolution if this were an  abscess. -Will Consult surgery     Anemia: Iron deficiency  May be chronic low GI bleed. Hemoglobin is stable 7.7.  2 weeks ago was 8.5 Continue with IV Protonix. Discussed with Dr. Kinnie Feil, [facility] no further evaluation unless patient develop active GI bleed. She will need IV iron, when infection is controlled.   History of candida glabrata fungemia;  Completed Eraxis. 6-26  Hypotension; BP normal range. Monitor. Continue with IV fluids.   Diabetes type 2: Continue with a sliding scale insulin  Hypertension: Hold amlodipine.  Continue with Lopressor.  Sacral decubitus ulcer stage  III sacrum present on admission.  Left LE stage II : Continue with wound care.   History of perforated gastric ulcer status post Grahan patch; enterocutaneous Fistula;  Continue with wound care.  Chronic Pain due to bilateral BKA: Continue with gabapentin and oxycodone Hyponatremia; Continue with IV fluids.     Pressure Injury 05/22/19 Coccyx Bilateral Stage 4 - Full thickness tissue loss with exposed bone, tendon or muscle. Per wound nurse, healing stage IV from prior admission (Active)  05/22/19 2104  Location: Coccyx  Location Orientation: Bilateral  Staging: Stage 4 - Full thickness tissue loss with exposed bone, tendon or muscle.  Wound Description (Comments): Per wound nurse, healing stage IV from prior admission  Present on Admission: Yes     Pressure Injury 05/22/19 Knee Right;Distal Unstageable - Full thickness tissue loss in which the base of the injury is covered by slough (yellow, tan, gray, green or brown) and/or eschar (tan, brown or black) in the wound bed. Stump (Active)  05/22/19 2106  Location: Knee  Location Orientation: Right;Distal  Staging: Unstageable - Full thickness tissue loss in which the  base of the injury is covered by slough (yellow, tan, gray, green or brown) and/or eschar (tan, brown or black) in the wound bed.  Wound Description (Comments): Stump  Present on Admission: Yes     Pressure Injury  05/22/19 Knee Left;Distal Unstageable - Full thickness tissue loss in which the base of the injury is covered by slough (yellow, tan, gray, green or brown) and/or eschar (tan, brown or black) in the wound bed. Stump (Active)  05/22/19 2108  Location: Knee  Location Orientation: Left;Distal  Staging: Unstageable - Full thickness tissue loss in which the base of the injury is covered by slough (yellow, tan, gray, green or brown) and/or eschar (tan, brown or black) in the wound bed.  Wound Description (Comments): Stump  Present on Admission: Yes     Pressure Injury 05/22/19 Thigh Left;Mid Stage 2 -  Partial thickness loss of dermis presenting as a shallow open injury with a red, pink wound bed without slough. (Active)  05/22/19 2109  Location: Thigh  Location Orientation: Left;Mid  Staging: Stage 2 -  Partial thickness loss of dermis presenting as a shallow open injury with a red, pink wound bed without slough.  Wound Description (Comments):   Present on Admission: Yes     Pressure Injury 07/22/19 Sacrum Mid Stage 3 -  Full thickness tissue loss. Subcutaneous fat may be visible but bone, tendon or muscle are NOT exposed. (Active)  07/22/19   Location: Sacrum  Location Orientation: Mid  Staging: Stage 3 -  Full thickness tissue loss. Subcutaneous fat may be visible but bone, tendon or muscle are NOT exposed.  Wound Description (Comments):   Present on Admission: Yes     Pressure Injury 07/22/19 Leg Left Stage 3 -  Full thickness tissue loss. Subcutaneous fat may be visible but bone, tendon or muscle are NOT exposed. (Active)  07/22/19   Location: Leg  Location Orientation: Left  Staging: Stage 3 -  Full thickness tissue loss. Subcutaneous fat may be visible but bone, tendon or muscle are NOT exposed.  Wound Description (Comments):   Present on Admission: Yes     Pressure Injury 07/22/19 Leg Right Stage 3 -  Full thickness tissue loss. Subcutaneous fat may be visible but bone, tendon or  muscle are NOT exposed. (Active)  07/22/19   Location: Leg  Location Orientation: Right  Staging: Stage 3 -  Full thickness tissue loss. Subcutaneous fat may be visible but bone, tendon or muscle are NOT exposed.  Wound Description (Comments):   Present on Admission: Yes                  Estimated body mass index is 18.29 kg/m as calculated from the following:   Height as of this encounter: 5\' 2"  (1.575 m).   Weight as of this encounter: 45.4 kg.   DVT prophylaxis: SCD due to concern for low hb Code Status: Full code, will need to discussed code status with patient.  Family Communication: care discussed with patient Disposition Plan:  Status is: inpatient.   Dispo: The patient is from: Home              Anticipated d/c is to: Home              Anticipated d/c date is: 3 days              Patient currently is not medically stable to d/c. Undergoing evaluation for fever, awaiting blood culture results.  Consultants:   ID  Surgery   Procedures:   none  Antimicrobials:  Eraxis  Subjective: She is alert, not toxic. Denies blood in the stool. Develops fever today.  BP was low at her PCP.   Objective: Vitals:   09/12/19 0230 09/12/19 0324 09/12/19 0354 09/12/19 0556  BP: (!) 117/56 (!) 120/57  132/72  Pulse: 90 87  64  Resp:  18  18  Temp:   (!) 102.2 F (39 C)   TempSrc:   Rectal   SpO2: 95% 100%  96%  Weight:      Height:       No intake or output data in the 24 hours ending 09/12/19 0821 Filed Weights   10/08/2019 1521  Weight: 45.4 kg    Examination:  General exam: Appears calm and comfortable  Respiratory system: Clear to auscultation. Respiratory effort normal. Cardiovascular system: S1 & S2 heard, RRR. No JVD, murmurs, rubs, gallops or clicks. No pedal edema. Gastrointestinal system: Abdomen is non distended, enterocutaneous fistula with ostomy bag in place.  Central nervous system: Alert and oriented.  Extremities: B/L  BKA  Data Reviewed: I have personally reviewed following labs and imaging studies  CBC: Recent Labs  Lab 09/24/2019 1531  WBC 9.1  HGB 7.7*  HCT 24.6*  MCV 91.4  PLT 983   Basic Metabolic Panel: Recent Labs  Lab 09/14/2019 1531 09/12/19 0443  NA 131* 131*  K 4.1 4.0  CL 96* 96*  CO2 24 27  GLUCOSE 203* 106*  BUN 34* 32*  CREATININE 0.90 1.01*  CALCIUM 8.4* 8.3*  MG  --  1.9  PHOS  --  3.7   GFR: Estimated Creatinine Clearance: 43.5 mL/min (A) (by C-G formula based on SCr of 1.01 mg/dL (H)). Liver Function Tests: Recent Labs  Lab 09/30/2019 1531 09/12/19 0443  AST 14* 13*  ALT 10 9  ALKPHOS 83 73  BILITOT 0.5 0.6  PROT 7.5 6.9  ALBUMIN 1.9* 1.7*   No results for input(s): LIPASE, AMYLASE in the last 168 hours. No results for input(s): AMMONIA in the last 168 hours. Coagulation Profile: Recent Labs  Lab 09/12/19 0443  INR 1.2   Cardiac Enzymes: No results for input(s): CKTOTAL, CKMB, CKMBINDEX, TROPONINI in the last 168 hours. BNP (last 3 results) No results for input(s): PROBNP in the last 8760 hours. HbA1C: No results for input(s): HGBA1C in the last 72 hours. CBG: Recent Labs  Lab 09/12/2019 1933 09/12/19 0308 09/12/19 0747  GLUCAP 155* 98 99   Lipid Profile: No results for input(s): CHOL, HDL, LDLCALC, TRIG, CHOLHDL, LDLDIRECT in the last 72 hours. Thyroid Function Tests: No results for input(s): TSH, T4TOTAL, FREET4, T3FREE, THYROIDAB in the last 72 hours. Anemia Panel: No results for input(s): VITAMINB12, FOLATE, FERRITIN, TIBC, IRON, RETICCTPCT in the last 72 hours. Sepsis Labs: No results for input(s): PROCALCITON, LATICACIDVEN in the last 168 hours.  Recent Results (from the past 240 hour(s))  SARS Coronavirus 2 by RT PCR (hospital order, performed in [facility] hospital lab) Nasopharyngeal Nasopharyngeal Swab     Status: None   Collection Time: 10/08/2019 10:06 PM   Specimen: Nasopharyngeal Swab  Result Value Ref Range Status   SARS  Coronavirus 2 NEGATIVE NEGATIVE Final    Comment: (NOTE) SARS-CoV-2 target nucleic acids are NOT DETECTED.  The SARS-CoV-2 RNA is generally detectable in upper and lower respiratory specimens during the acute phase of infection. The lowest concentration of SARS-CoV-2 viral copies this assay can detect is 250 copies /  mL. A negative result does not preclude SARS-CoV-2 infection and should not be used as the sole basis for treatment or other patient management decisions.  A negative result may occur with improper specimen collection / handling, submission of specimen other than nasopharyngeal swab, presence of viral mutation(s) within the areas targeted by this assay, and inadequate number of viral copies (<250 copies / mL). A negative result must be combined with clinical observations, patient history, and epidemiological information.  Fact Sheet for Patients:   StrictlyIdeas.no  Fact Sheet for Healthcare Providers: BankingDealers.co.za  This test is not yet approved or  cleared by the [place] FDA and has been authorized for detection and/or diagnosis of SARS-CoV-2 by FDA under an Emergency Use Authorization (EUA).  This EUA will remain in effect (meaning this test can be used) for the duration of the COVID-19 declaration under Section 564(b)(1) of the Act, 21 U.S.C. section 360bbb-3(b)(1), unless the authorization is terminated or revoked sooner.  Performed at [facility], [place] [address] [place], [place] [address]          Radiology Studies: No results found.      Scheduled Meds: . amLODipine  5 mg Oral Daily  . cholestyramine  4 g Oral BID  . gabapentin  300 mg Oral BID  . insulin aspart  0-15 Units Subcutaneous TID WC  . insulin aspart  0-5 Units Subcutaneous QHS  . metoprolol tartrate  50 mg Oral BID  . [START ON 09/15/2019] pantoprazole  40 mg Intravenous Q12H   Continuous Infusions: . pantoprozole  (PROTONIX) infusion 8 mg/hr (09/12/19 0518)     LOS: 0 days    Time spent: 35 minutes.     Elmarie Shiley, MD [facility]   If 7PM-7AM, please contact night-coverage [email or web address]  09/12/2019, 8:21 AM

## 2019-09-12 NOTE — Progress Notes (Signed)
PHARMACY - TOTAL PARENTERAL NUTRITION CONSULT NOTE  Indication:  EC fistula  Patient Measurements: Height: 5\' 2"  (157.5 cm) Weight: 45.4 kg (100 lb) IBW/kg (Calculated) : 50.1 TPN AdjBW (KG): 45.4 Body mass index is 18.29 kg/m. Usual Weight: 53-60 kg  Assessment:  46 YOF presents on 09/29/2019 with anemia.  Patient has a history of perforated prepyloric ulcer s/p [place].  She has an Chief Strategy Officer in place from January 2021 due to [facility] fistula and has been on TPN since 02/2019.  She was recently admitted with candidemia, resulting in PICC replacement.  Pharmacy consulted to continue TPN.  Patient reports not missing any TPN bags PTA.  Glucose / Insulin: hx DM controlled by diet outpt. Required insulin gtt last admit. Electrolytes: low Na/CL, others WNL Renal: SCr 1.01 (BL 0.6-0.8), BUN 32 LFTs / TGs: LFTs / tbili WNL.  TG 252 last admit. Prealbumin / albumin: Prealbumin 9.4 (6/15), albumin 1.7 Intake / Output; MIVF: NS at 100 ml/hr GI Imaging: none since admit Surgeries / Procedures: none since admit  Central access: PICC removed 6/12; replaced 6/16 TPN start date: chronic TPN  Nutritional Goals (per [facility] staff on 7/2): kCal: 1472 kCal, Protein: 90g, CHO 180g, ILE 50g, no insulin Infuse 18 hrs over 14 hrs  RD goals on 6/16: 1500-1700 kCal, 65-80g AA and >/= 1.5L fluid per day  Current Nutrition:  HH/CMD  Plan:  Per discussion with MD, hold off on starting TPN given fever of 102.2 and recent fungemia MD will re-consult when indicated  Rochelle Larue D. Mina Marble, PharmD, BCPS, [place] 09/12/2019, 9:19 AM

## 2019-09-12 NOTE — Progress Notes (Addendum)
Initial Nutrition Assessment  DOCUMENTATION CODES:   Non-severe (moderate) malnutrition in context of chronic illness  INTERVENTION:   TPN currently on hold per MD. Recommend resuming TPN once able/appropriate by MD and team.  Provide Boost Breeze po TID, each supplement provides 250 kcal and 9 grams of protein.  NUTRITION DIAGNOSIS:   Moderate Malnutrition related to chronic illness (EC fistula, perforated prepyloric ulcer on TPN) as evidenced by percent weight loss, moderate fat depletion, moderate muscle depletion.  GOAL:   Patient will meet greater than or equal to 90% of their needs  MONITOR:   PO intake, Supplement acceptance, Skin, Weight trends, Labs, I & O's  REASON FOR ASSESSMENT:   Consult Assessment of nutrition requirement/status  ASSESSMENT:   59 y.o. female with history of perforated prepyloric ulcer in 2020 s/p  exploratory laparotomy with [name] patch, chronic TPN via right arm PICC, IDDM, bilateral lower quadrant drains along with enterocutaneous fistula with colostomy bag, Chronic sacral pressure ulcers stage II, HTN, schizophrenia, bilateral BKA, PVD s/p B/L BKA presents with fever and low hemoglobin. Fecal occult blood was noted to be positive.   TPN currently on hold given fever and recent history of fungemia per MD and pharmacy. Pt currently on PO diet. Pt reports eating at least 3 meals a day, however reports intake amounts are small. Majority of nutrition provided by home TPN. Recommend resuming TPN as appropriate pending labs and ongoing evaluations for source of potential infection. Noted pt with a 9% weight loss over the past 1 month per weight records, which is significant for time frame. Pt reports favoring Boost Breeze supplements. RD to order. Labs and medications reviewed.   Per Pharmacy, pt cyclic home TPN provides 1472 kcal and 90 grams of protein.   NUTRITION - FOCUSED PHYSICAL EXAM:    Most Recent Value  Orbital Region Moderate depletion   Upper Arm Region Moderate depletion  Thoracic and Lumbar Region Moderate depletion  Buccal Region Moderate depletion  Temple Region Moderate depletion  Clavicle Bone Region Moderate depletion  Clavicle and Acromion Bone Region Moderate depletion  Scapular Bone Region Unable to assess  Dorsal Hand Unable to assess  Patellar Region Unable to assess  Anterior Thigh Region Unable to assess  Posterior Calf Region Unable to assess  Edema (RD Assessment) None  Hair Reviewed  Eyes Reviewed  Mouth Reviewed  Skin Reviewed  Nails Reviewed      Labs and medications reviewed.   Diet Order:   Diet Order            Diet heart healthy/carb modified Room service appropriate? Yes; Fluid consistency: Thin  Diet effective now                 EDUCATION NEEDS:   Not appropriate for education at this time  Skin:  Skin Assessment: Skin Integrity Issues: Skin Integrity Issues:: Stage IV Stage IV: bilateral coccyx  Last BM:  7/2 colostomy  Height:   Ht Readings from Last 1 Encounters:  10/08/2019 5\' 2"  (1.575 m)    Weight:   Wt Readings from Last 1 Encounters:  09/21/2019 45.4 kg   BMI:  Body mass index is 18.29 kg/m.  Estimated Nutritional Needs:   Kcal:  1500-1700  Protein:  65-80 grams  Fluid:  >/= 1.5 L/day  Corrin Parker, MS, RD, LDN RD pager number/after hours weekend pager number on Amion.

## 2019-09-12 NOTE — Consult Note (Signed)
Gina [name] for Infectious Disease    Date of Admission:  10/06/2019          Reason for Consult: Recurrent fever    Referring Provider: Dr. Niel Hummer  Assessment: Gina [name] has multiple potential sources of infection including her PICC line, intra-abdominal infection, UTI, and pressure sores.  She actually is more alert and looks better than I have ever seen her to look.  I will restart antifungal therapy with anidulafungin pending blood culture results and further observation.  I do not feel broader antibiotic therapy or imaging is warranted at this time given her relatively good condition.  Plan: 1. Restart anidulafungin pending culture results  Principal Problem:   Acute GI bleeding Active Problems:   Hyperglycemia   HTN (hypertension)   Diabetes (HCC)   Anemia of chronic disease   Sacral decubitus ulcer   Perforated gastric ulcer (HCC)   S/P BKA (below knee amputation) bilateral (HCC)   Hypoalbuminemia   Anemia   Scheduled Meds: . cholestyramine  4 g Oral BID  . cyanocobalamin  1,000 mcg Intramuscular Once  . gabapentin  300 mg Oral BID  . insulin aspart  0-15 Units Subcutaneous TID WC  . insulin aspart  0-5 Units Subcutaneous QHS  . metoprolol tartrate  50 mg Oral BID  . [START ON 09/15/2019] pantoprazole  40 mg Intravenous Q12H   Continuous Infusions: . sodium chloride    . [START ON 09/13/2019] anidulafungin    . anidulafungin    . pantoprozole (PROTONIX) infusion 8 mg/hr (09/12/19 0518)   PRN Meds:.acetaminophen, oxyCODONE  HPI: Gina [name] is a 59 y.o. female with diabetes and schizophrenia who suffered a gastric perforation last November.  She underwent patch repair of the perforation.  She had diffuse peritonitis and Candida glabrata fungemia.  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  She has undergone colostomy.  She has had a PICC for TPN ever since.   She was hospitalized again in March with MRSA bacteremia.  She was treated for a Proteus UTI in April.  She was admitted again on 08/17/2019 with candidemia secondary to Candida glabrata.  Repeat blood cultures were positive on 08/19/2019 but became negative by 08/23/2019.  She was discharged on caspofungin after her PICC was replaced and completed therapy on 09/09/2019.  I spoke to her daughter by phone who said that she started having recurrent fever toward the end of her caspofungin therapy.  She also had some nausea and began to have some green drainage from her right BKA stump.  She states that her sacral pressure sores look good.  Review of Systems: Review of Systems  Unable to perform ROS: Mental acuity    Past Medical History:  Diagnosis Date  . Diabetes mellitus   . Hypertension   . Osteomyelitis of right foot ([place]) 02/22/2017  . Perforated gastric ulcer ([place])   . S/P bilateral BKA (below knee amputation) ([place])   . Schizophrenia ([place])   . Schizophrenia ([place])   . Septic arthritis of interphalangeal joint of toe of left foot ([place]) 06/02/2016  . Stress incontinence 04/26/2017    Social History   Tobacco Use  . Smoking status: Never Smoker  . Smokeless tobacco: Never Used  Vaping Use  . Vaping Use: Never used  Substance Use Topics  . Alcohol use: No    Comment: occasionally  . Drug use: No  Family History  Problem Relation Age of Onset  . Diabetes type II Father   . CAD Father   . Prostate cancer Father   . Diabetes Mellitus II Brother    Allergies  Allergen Reactions  . Bee Venom Swelling    Swells up with bee stings   . Metformin And Related Other (See Comments)    Upset stomach    OBJECTIVE: Blood pressure 132/72, pulse 64, temperature (!) 102.2 F (39 C), temperature source Rectal, resp. rate 18, height 5\' 2"  (1.575 m), weight 45.4 kg, last menstrual period 03/29/2015, SpO2 96 %.  Physical Exam Constitutional:      Comments: She is alert and in no distress.   She is sitting up on a stretcher in the emergency department.  She is holding the phone to her ear but not talking.  Her daughter is on the other end of the phone.  Cardiovascular:     Rate and Rhythm: Normal rate and regular rhythm.     Heart sounds: No murmur heard.   Pulmonary:     Effort: Pulmonary effort is normal.     Breath sounds: Normal breath sounds.  Abdominal:     General: There is no distension.     Palpations: Abdomen is soft.     Tenderness: There is no abdominal tenderness.     Comments: Liquid brown stool in her ostomy bag.  Musculoskeletal:     Comments: Hypopigmented abrasions on her lateral BKA stumps without significant drainage, cellulitis or malodor.  Skin:    Findings: No rash.     Comments: Right arm PICC site looks good.     Lab Results Lab Results  Component Value Date   WBC 9.9 09/12/2019   HGB 8.0 (L) 09/12/2019   HCT 25.9 (L) 09/12/2019   MCV 93.8 09/12/2019   PLT 363 09/12/2019    Lab Results  Component Value Date   CREATININE 1.01 (H) 09/12/2019   BUN 32 (H) 09/12/2019   NA 131 (L) 09/12/2019   K 4.0 09/12/2019   CL 96 (L) 09/12/2019   CO2 27 09/12/2019    Lab Results  Component Value Date   ALT 9 09/12/2019   AST 13 (L) 09/12/2019   ALKPHOS 73 09/12/2019   BILITOT 0.6 09/12/2019     Microbiology: Recent Results (from the past 240 hour(s))  SARS Coronavirus 2 by RT PCR (hospital order, performed in [place] hospital lab) Nasopharyngeal Nasopharyngeal Swab     Status: None   Collection Time: 10/02/2019 10:06 PM   Specimen: Nasopharyngeal Swab  Result Value Ref Range Status   SARS Coronavirus 2 NEGATIVE NEGATIVE Final    Comment: (NOTE) SARS-CoV-2 target nucleic acids are NOT DETECTED.  The SARS-CoV-2 RNA is generally detectable in upper and lower respiratory specimens during the acute phase of infection. The lowest concentration of SARS-CoV-2 viral copies this assay can detect is 250 copies / mL. A negative result does not  preclude SARS-CoV-2 infection and should not be used as the sole basis for treatment or other patient management decisions.  A negative result may occur with improper specimen collection / handling, submission of specimen other than nasopharyngeal swab, presence of viral mutation(s) within the areas targeted by this assay, and inadequate number of viral copies (<250 copies / mL). A negative result must be combined with clinical observations, patient history, and epidemiological information.  Fact Sheet for Patients:   StrictlyIdeas.no  Fact Sheet for Healthcare Providers: BankingDealers.co.za  This test is not yet  approved or  cleared by the [place] and has been authorized for detection and/or diagnosis of SARS-CoV-2 by FDA under an Emergency Use Authorization (EUA).  This EUA will remain in effect (meaning this test can be used) for the duration of the COVID-19 declaration under Section 564(b)(1) of the Act, 21 U.S.C. section 360bbb-3(b)(1), unless the authorization is terminated or revoked sooner.  Performed at [facility], [place] [address], [place], [place] [address]     Michel Bickers, [facility] [phone] pager   [phone] cell 09/12/2019, 10:15 AM

## 2019-09-13 DIAGNOSIS — K922 Gastrointestinal hemorrhage, unspecified: Secondary | ICD-10-CM

## 2019-09-13 DIAGNOSIS — K632 Fistula of intestine: Secondary | ICD-10-CM

## 2019-09-13 DIAGNOSIS — L89152 Pressure ulcer of sacral region, stage 2: Secondary | ICD-10-CM

## 2019-09-13 DIAGNOSIS — E0810 Diabetes mellitus due to underlying condition with ketoacidosis without coma: Secondary | ICD-10-CM

## 2019-09-13 DIAGNOSIS — Z89512 Acquired absence of left leg below knee: Secondary | ICD-10-CM

## 2019-09-13 DIAGNOSIS — Z89511 Acquired absence of right leg below knee: Secondary | ICD-10-CM

## 2019-09-13 LAB — PREPARE RBC (CROSSMATCH)

## 2019-09-13 LAB — BASIC METABOLIC PANEL
Anion gap: 8 (ref 5–15)
BUN: 29 mg/dL — ABNORMAL HIGH (ref 6–20)
CO2: 24 mmol/L (ref 22–32)
Calcium: 8.1 mg/dL — ABNORMAL LOW (ref 8.9–10.3)
Chloride: 105 mmol/L (ref 98–111)
Creatinine, Ser: 1.26 mg/dL — ABNORMAL HIGH (ref 0.44–1.00)
GFR calc Af Amer: 54 mL/min — ABNORMAL LOW (ref >60)
GFR calc non Af Amer: 47 mL/min — ABNORMAL LOW (ref >60)
Glucose, Bld: 103 mg/dL — ABNORMAL HIGH (ref 70–99)
Potassium: 3.8 mmol/L (ref 3.5–5.1)
Sodium: 137 mmol/L (ref 135–145)

## 2019-09-13 LAB — GLUCOSE, CAPILLARY
Glucose-Capillary: 122 mg/dL — ABNORMAL HIGH (ref 70–99)
Glucose-Capillary: 89 mg/dL (ref 70–99)
Glucose-Capillary: 104 mg/dL — ABNORMAL HIGH (ref 70–99)
Glucose-Capillary: 118 mg/dL — ABNORMAL HIGH (ref 70–99)

## 2019-09-13 LAB — CBC
HCT: 21.5 % — ABNORMAL LOW (ref 36.0–46.0)
Hemoglobin: 6.6 g/dL — CL (ref 12.0–15.0)
MCH: 28.8 pg (ref 26.0–34.0)
MCHC: 30.7 g/dL (ref 30.0–36.0)
MCV: 93.9 fL (ref 80.0–100.0)
Platelets: 344 10*3/uL (ref 150–400)
RBC: 2.29 MIL/uL — ABNORMAL LOW (ref 3.87–5.11)
RDW: 15.3 % (ref 11.5–15.5)
WBC: 7.2 10*3/uL (ref 4.0–10.5)
nRBC: 0 % (ref 0.0–0.2)

## 2019-09-13 LAB — PREALBUMIN: Prealbumin: 5.9 mg/dL — ABNORMAL LOW (ref 18–38)

## 2019-09-13 LAB — PROCALCITONIN: Procalcitonin: 0.2 ng/mL

## 2019-09-13 LAB — HEMOGLOBIN AND HEMATOCRIT, BLOOD
HCT: 27.4 % — ABNORMAL LOW (ref 36.0–46.0)
Hemoglobin: 8.6 g/dL — ABNORMAL LOW (ref 12.0–15.0)

## 2019-09-13 LAB — URINE CULTURE

## 2019-09-13 MED ORDER — CHLORHEXIDINE GLUCONATE CLOTH 2 % EX PADS
6.0000 | MEDICATED_PAD | Freq: Every day | CUTANEOUS | Status: DC
  Administered 2019-09-13 – 2019-10-02 (×20): 6 via TOPICAL

## 2019-09-13 MED ORDER — SODIUM CHLORIDE 0.9% IV SOLUTION: Freq: Once | INTRAVENOUS | Status: AC

## 2019-09-13 MED ORDER — SODIUM CHLORIDE 0.9% FLUSH
10.0000 mL | INTRAVENOUS | Status: DC | PRN
  Administered 2019-09-23: 10 mL

## 2019-09-13 MED ORDER — COLLAGENASE 250 UNIT/GM EX OINT
TOPICAL_OINTMENT | Freq: Every day | CUTANEOUS | Status: DC
  Filled 2019-09-13 (×2): qty 30

## 2019-09-13 MED ORDER — SODIUM CHLORIDE 0.9% FLUSH
10.0000 mL | Freq: Two times a day (BID) | INTRAVENOUS | Status: DC
  Administered 2019-09-13 – 2019-10-02 (×19): 10 mL

## 2019-09-13 NOTE — Progress Notes (Addendum)
Gina [name] [id number] 1960-04-19  CARE TEAM:  PCP: [name], MD  Outpatient Care Team: Patient Care Team: [name], MD as PCP - General (Internal Medicine)  Inpatient Treatment Team: Treatment Team: Attending Provider: Elmarie Shiley, MD; Rounding Team: Willadean Carol, MD; Consulting Physician: Edison Pace, Md, MD; Technician: Gasper Sells, [place]; Registered Nurse: Pieter Partridge, RN; Physical Therapist: Lucendia Herrlich, PT; Occupational Therapist: Zenovia Jarred, OT; Technician: Merton Border, NT; Registered Nurse: Minna Antis, RN   Problem List:   Principal Problem:   Acute GI bleeding Active Problems:   Enterocutaneous fistula   Hyperglycemia   HTN (hypertension)   Diabetes ([place])   Severe protein-calorie malnutrition ([place])   Anemia of chronic disease   Sacral decubitus ulcer   Perforated gastric ulcer ([place])   S/P BKA (below knee amputation) bilateral ([place])   Iron deficiency anemia   Hypoalbuminemia   Anemia      * No surgery found *   Enterocutaneous fistula             History of perforated pyloric channel ulcer, oversewed with [name] patch - 02/03/2019 - [place]             Has pouch around fistula since 02/2019    Assessment  Enterocutaneus fistula stable.  [facility] Stay = 1 days)  Plan:  Still having a fair amount of bowel movements transanally which argues against complete fistula.  Continue pouching and wound care.  Wound ostomy consult as needed, but suspect nonemergent issues since has had this pouch/fistula for 7 months without any worsening decline.  Supplemental shakes.  Check nutritional status.  Severely deconditioned and bedridden state makes operative intervention with extremely high failure rate.  Hemoglobin stable.  PPI for perforated ulcer.  Gastritis and irritation on an upper endoscopy by Dr. Michail Sermon with [facility] May 2021.  No malignancy.  Defer to GI with  further management  There are no  acute surgical needs at this time.  Surgery will be available but will follow more peripherally at this time. -VTE prophylaxis- SCDs, etc -mobilize as tolerated to help recovery  20 minutes spent in review, evaluation, examination, counseling, and coordination of care.  More than 50% of that time was spent in counseling.  09/13/2019    Subjective: (Chief complaint)  Tired in bed.  Empties fistula bag few times a day.  Still having transanal bowel movements.  Objective:  Vital signs:  Vitals:   09/12/19 1236 09/12/19 2109 09/13/19 0238 09/13/19 0530  BP: (!) 145/75 114/65 131/67 (!) 152/76  Pulse: 60 72 76 78  Resp: 17 14 14 16   Temp: 97.6 F (36.4 C) 98.3 F (36.8 C) 99 F (37.2 C) 98.2 F (36.8 C)  TempSrc: Oral Oral Oral Oral  SpO2: 100% 100% 97% 100%  Weight:      Height:        Last BM Date: 09/12/19  Intake/Output   Yesterday:  07/02 0701 - 07/03 0700 In: 820.9 [P.O.:60; I.V.:760.9] Out: 200 [Urine:200] This shift:  No intake/output data recorded.  Bowel function:  Flatus: YES  BM:  YES  Drain: (No drain)   Physical Exam:  General: Pt awake/alert in mild acute distress Eyes: PERRL, normal EOM.  Sclera clear.  No icterus Neuro: CN II-XII intact w/o focal sensory/motor deficits. Lymph: No head/neck/groin lymphadenopathy Psych:  No delerium/psychosis/paranoia.  Oriented x 3 HENT: Normocephalic, Mucus membranes moist.  No thrush Neck: Supple, No tracheal deviation.  No obvious thyromegaly Chest:  No pain to chest wall compression.  Good respiratory excursion.  No audible wheezing CV:  Pulses intact.  Regular rhythm.  No major extremity edema MS: Normal AROM mjr joints.  No obvious deformity  Abdomen: Somewhat firm.  Nondistended.  MILD Tenderness at RUQ EC fistula only.  Gas and tan effluent in bag.  No evidence of peritonitis.  No incarcerated hernias.  Ext:  BKA bilateral unchanged.  No mjr edema.  No cyanosis Skin: No petechiae / purpurea.  Warm  and dry    Results:   Cultures: Recent Results (from the past 720 hour(s))  SARS Coronavirus 2 by RT PCR (hospital order, performed in [facility] hospital lab) Nasopharyngeal Nasopharyngeal Swab     Status: None   Collection Time: 08/17/19  8:55 AM   Specimen: Nasopharyngeal Swab  Result Value Ref Range Status   SARS Coronavirus 2 NEGATIVE NEGATIVE Final    Comment: (NOTE) SARS-CoV-2 target nucleic acids are NOT DETECTED. The SARS-CoV-2 RNA is generally detectable in upper and lower respiratory specimens during the acute phase of infection. The lowest concentration of SARS-CoV-2 viral copies this assay can detect is 250 copies / mL. A negative result does not preclude SARS-CoV-2 infection and should not be used as the sole basis for treatment or other patient management decisions.  A negative result may occur with improper specimen collection / handling, submission of specimen other than nasopharyngeal swab, presence of viral mutation(s) within the areas targeted by this assay, and inadequate number of viral copies (<250 copies / mL). A negative result must be combined with clinical observations, patient history, and epidemiological information. Fact Sheet for Patients:   StrictlyIdeas.no Fact Sheet for Healthcare Providers: BankingDealers.co.za This test is not yet approved or cleared  by the [place] FDA and has been authorized for detection and/or diagnosis of SARS-CoV-2 by FDA under an Emergency Use Authorization (EUA).  This EUA will remain in effect (meaning this test can be used) for the duration of the COVID-19 declaration under Section 564(b)(1) of the Act, 21 U.S.C. section 360bbb-3(b)(1), unless the authorization is terminated or revoked sooner. Performed at [facility], [place] [address] [place], [place] [address]   Urine culture     Status: Abnormal   Collection Time: 08/17/19  9:05 AM   Specimen: In/Out  Cath Urine  Result Value Ref Range Status   Specimen Description IN/OUT CATH URINE  Final   Special Requests   Final    NONE Performed at [facility], [place] [address], [place], [place] [address]    Culture >=100,000 COLONIES/mL PROTEUS MIRABILIS (A)  Final   Report Status 08/20/2019 FINAL  Final   Organism ID, Bacteria PROTEUS MIRABILIS (A)  Final      Susceptibility   Proteus mirabilis - MIC*    AMPICILLIN <=2 SENSITIVE Sensitive     CEFAZOLIN <=4 SENSITIVE Sensitive     CEFTRIAXONE <=1 SENSITIVE Sensitive     CIPROFLOXACIN >=4 RESISTANT Resistant     GENTAMICIN <=1 SENSITIVE Sensitive     IMIPENEM 2 SENSITIVE Sensitive     NITROFURANTOIN 128 RESISTANT Resistant     TRIMETH/SULFA >=320 RESISTANT Resistant     AMPICILLIN/SULBACTAM <=2 SENSITIVE Sensitive     PIP/TAZO <=4 SENSITIVE Sensitive     * >=100,000 COLONIES/mL PROTEUS MIRABILIS  Blood Culture (routine x 2)     Status: Abnormal   Collection Time: 08/17/19  9:50 AM   Specimen: BLOOD LEFT ARM  Result Value Ref Range Status  Specimen Description BLOOD LEFT ARM  Final   Special Requests   Final    BOTTLES DRAWN AEROBIC AND ANAEROBIC Blood Culture adequate volume   Culture  Setup Time   Final    BUDDING YEAST SEEN IN BOTH AEROBIC AND ANAEROBIC BOTTLES CRITICAL RESULT CALLED TO, READ BACK BY AND VERIFIED WITH: Park Breed [id number] FCP Performed at [facility], [address] [place], [place] [address]    Culture CANDIDA GLABRATA (A)  Final   Report Status 08/21/2019 FINAL  Final  Blood Culture ID Panel (Reflexed)     Status: Abnormal   Collection Time: 08/17/19  9:50 AM  Result Value Ref Range Status   Enterococcus species NOT DETECTED NOT DETECTED Final   Listeria monocytogenes NOT DETECTED NOT DETECTED Final   Staphylococcus species NOT DETECTED NOT DETECTED Final   Staphylococcus aureus (BCID) NOT DETECTED NOT DETECTED Final   Streptococcus species NOT DETECTED NOT DETECTED Final    Streptococcus agalactiae NOT DETECTED NOT DETECTED Final   Streptococcus pneumoniae NOT DETECTED NOT DETECTED Final   Streptococcus pyogenes NOT DETECTED NOT DETECTED Final   Acinetobacter baumannii NOT DETECTED NOT DETECTED Final   Enterobacteriaceae species NOT DETECTED NOT DETECTED Final   Enterobacter cloacae complex NOT DETECTED NOT DETECTED Final   Escherichia coli NOT DETECTED NOT DETECTED Final   Klebsiella oxytoca NOT DETECTED NOT DETECTED Final   Klebsiella pneumoniae NOT DETECTED NOT DETECTED Final   Proteus species NOT DETECTED NOT DETECTED Final   Serratia marcescens NOT DETECTED NOT DETECTED Final   Haemophilus influenzae NOT DETECTED NOT DETECTED Final   Neisseria meningitidis NOT DETECTED NOT DETECTED Final   Pseudomonas aeruginosa NOT DETECTED NOT DETECTED Final   Candida albicans NOT DETECTED NOT DETECTED Final   Candida glabrata DETECTED (A) NOT DETECTED Final    Comment: CRITICAL RESULT CALLED TO, READ BACK BY AND VERIFIED WITH: PHARMD JEREMY F. [id number] FCP    Candida krusei NOT DETECTED NOT DETECTED Final   Candida parapsilosis NOT DETECTED NOT DETECTED Final   Candida tropicalis NOT DETECTED NOT DETECTED Final    Comment: Performed at [facility], [address], [place], [place] [address]  Blood Culture (routine x 2)     Status: Abnormal   Collection Time: 08/17/19  1:12 PM   Specimen: BLOOD LEFT ARM  Result Value Ref Range Status   Specimen Description BLOOD LEFT ARM  Final   Special Requests   Final    BOTTLES DRAWN AEROBIC ONLY Blood Culture adequate volume   Culture  Setup Time   Final    AEROBIC BOTTLE ONLY YEAST CRITICAL VALUE NOTED.  VALUE IS CONSISTENT WITH PREVIOUSLY REPORTED AND CALLED VALUE. Performed at [facility], [place] [address] [place], [place] [address]    Culture CANDIDA GLABRATA (A)  Final   Report Status 08/21/2019 FINAL  Final  MRSA PCR Screening     Status: Abnormal   Collection Time: 08/17/19  8:23 PM   Specimen:  Nasal Mucosa; Nasopharyngeal  Result Value Ref Range Status   MRSA by PCR POSITIVE (A) NEGATIVE Final    Comment:        The GeneXpert MRSA Assay (FDA approved for NASAL specimens only), is one component of a comprehensive MRSA colonization surveillance program. It is not intended to diagnose MRSA infection nor to guide or monitor treatment for MRSA infections. RESULT CALLED TO, READ BACK BY AND VERIFIED WITH: J. MILLER,CHARGE RN [id number] 08/17/2019 T. TYSOR Performed at  [facility], [place] [address] [place], [place] [address]   Culture, blood (routine x 2)     Status: Abnormal   Collection Time: 08/19/19  5:56 AM   Specimen: BLOOD LEFT HAND  Result Value Ref Range Status   Specimen Description BLOOD LEFT HAND  Final   Special Requests   Final    BOTTLES DRAWN AEROBIC AND ANAEROBIC Blood Culture adequate volume   Culture  Setup Time   Final    ANAEROBIC BOTTLE ONLY YEAST CRITICAL VALUE NOTED.  VALUE IS CONSISTENT WITH PREVIOUSLY REPORTED AND CALLED VALUE. Performed at [facility], [place] [address], [place], [place] [address]    Culture CANDIDA GLABRATA (A)  Final   Report Status 08/26/2019 FINAL  Final  Culture, blood (routine x 2)     Status: Abnormal   Collection Time: 08/19/19  5:56 AM   Specimen: BLOOD LEFT HAND  Result Value Ref Range Status   Specimen Description BLOOD LEFT HAND  Final   Special Requests   Final    BOTTLES DRAWN AEROBIC AND ANAEROBIC Blood Culture adequate volume   Culture  Setup Time (A)  Final    YEAST AEROBIC BOTTLE ONLY CRITICAL RESULT CALLED TO, READ BACK BY AND VERIFIED WITH: PHARMD Y PATEL [id number] AT 76 AM BY CM Performed at [facility], [place] [address], [place], [place] [address]    Culture CANDIDA GLABRATA (A)  Final   Report Status 08/24/2019 FINAL  Final  Culture, blood (routine x 2)     Status: None   Collection Time: 08/23/19 11:17 AM   Specimen: BLOOD LEFT FOREARM  Result Value Ref Range Status   Specimen Description  BLOOD LEFT FOREARM  Final   Special Requests   Final    BOTTLES DRAWN AEROBIC ONLY Blood Culture adequate volume   Culture   Final    NO GROWTH 5 DAYS Performed at [facility], [place] [address], [place], [place] [address]    Report Status 08/28/2019 FINAL  Final  Culture, blood (routine x 2)     Status: None   Collection Time: 08/23/19 11:22 AM   Specimen: BLOOD LEFT FOREARM  Result Value Ref Range Status   Specimen Description BLOOD LEFT FOREARM  Final   Special Requests   Final    BOTTLES DRAWN AEROBIC ONLY Blood Culture adequate volume   Culture   Final    NO GROWTH 5 DAYS Performed at [facility], [place] [address], [place], [place] [address]    Report Status 08/28/2019 FINAL  Final  SARS Coronavirus 2 by RT PCR (hospital order, performed in [facility] hospital lab) Nasopharyngeal Nasopharyngeal Swab     Status: None   Collection Time: 10/06/2019 10:06 PM   Specimen: Nasopharyngeal Swab  Result Value Ref Range Status   SARS Coronavirus 2 NEGATIVE NEGATIVE Final    Comment: (NOTE) SARS-CoV-2 target nucleic acids are NOT DETECTED.  The SARS-CoV-2 RNA is generally detectable in upper and lower respiratory specimens during the acute phase of infection. The lowest concentration of SARS-CoV-2 viral copies this assay can detect is 250 copies / mL. A negative result does not preclude SARS-CoV-2 infection and should not be used as the sole basis for treatment or other patient management decisions.  A negative result may occur with improper specimen collection / handling, submission of specimen other than nasopharyngeal swab, presence of viral mutation(s) within the areas targeted by this assay, and inadequate number of viral copies (<  250 copies / mL). A negative result must be combined with clinical observations, patient history, and epidemiological information.  Fact Sheet for Patients:   StrictlyIdeas.no  Fact Sheet for Healthcare  Providers: BankingDealers.co.za  This test is not yet approved or  cleared by the [place] FDA and has been authorized for detection and/or diagnosis of SARS-CoV-2 by FDA under an Emergency Use Authorization (EUA).  This EUA will remain in effect (meaning this test can be used) for the duration of the COVID-19 declaration under Section 564(b)(1) of the Act, 21 U.S.C. section 360bbb-3(b)(1), unless the authorization is terminated or revoked sooner.  Performed at [facility], [place] [address], [place], [place] [address]     Labs: Results for orders placed or performed during the hospital encounter of 09/20/2019 (from the past 48 hour(s))  Type and screen [place]     Status: None   Collection Time: 09/13/2019  3:28 PM  Result Value Ref Range   ABO/RH(D) O POS    Antibody Screen NEG    Sample Expiration      09/14/2019,2359 Performed at [facility], [place] [address], [place], [place] [address]   Comprehensive metabolic panel     Status: Abnormal   Collection Time: 09/30/2019  3:31 PM  Result Value Ref Range   Sodium 131 (L) 135 - 145 mmol/L   Potassium 4.1 3.5 - 5.1 mmol/L   Chloride 96 (L) 98 - 111 mmol/L   CO2 24 22 - 32 mmol/L   Glucose, Bld 203 (H) 70 - 99 mg/dL    Comment: Glucose reference range applies only to samples taken after fasting for at least 8 hours.   BUN 34 (H) 6 - 20 mg/dL   Creatinine, Ser 0.90 0.44 - 1.00 mg/dL   Calcium 8.4 (L) 8.9 - 10.3 mg/dL   Total Protein 7.5 6.5 - 8.1 g/dL   Albumin 1.9 (L) 3.5 - 5.0 g/dL   AST 14 (L) 15 - 41 U/L   ALT 10 0 - 44 U/L   Alkaline Phosphatase 83 38 - 126 U/L   Total Bilirubin 0.5 0.3 - 1.2 mg/dL   GFR calc non Af Amer >60 >60 mL/min   GFR calc Af Amer >60 >60 mL/min   Anion gap 11 5 - 15    Comment: Performed at [place] [address] [place], [place] [address]  CBC     Status: Abnormal   Collection Time: 09/15/2019  3:31 PM  Result Value Ref Range    WBC 9.1 4.0 - 10.5 K/uL   RBC 2.69 (L) 3.87 - 5.11 MIL/uL   Hemoglobin 7.7 (L) 12.0 - 15.0 g/dL   HCT 24.6 (L) 36 - 46 %   MCV 91.4 80.0 - 100.0 fL   MCH 28.6 26.0 - 34.0 pg   MCHC 31.3 30.0 - 36.0 g/dL   RDW 15.2 11.5 - 15.5 %   Platelets 365 150 - 400 K/uL   nRBC 0.0 0.0 - 0.2 %    Comment: Performed at [facility], [place] [address], [place], [place] [address]  CBG monitoring, ED     Status: Abnormal   Collection Time: 09/16/2019  7:33 PM  Result Value Ref Range   Glucose-Capillary 155 (H) 70 - 99 mg/dL    Comment: Glucose reference range applies only to samples taken after fasting for at least 8 hours.  POC occult blood, ED     Status: Abnormal  Collection Time: 10/01/2019  9:31 PM  Result Value Ref Range   Fecal Occult Bld POSITIVE (A) NEGATIVE  SARS Coronavirus 2 by RT PCR (hospital order, performed in [facility] hospital lab) Nasopharyngeal Nasopharyngeal Swab     Status: None   Collection Time: 09/16/2019 10:06 PM   Specimen: Nasopharyngeal Swab  Result Value Ref Range   SARS Coronavirus 2 NEGATIVE NEGATIVE    Comment: (NOTE) SARS-CoV-2 target nucleic acids are NOT DETECTED.  The SARS-CoV-2 RNA is generally detectable in upper and lower respiratory specimens during the acute phase of infection. The lowest concentration of SARS-CoV-2 viral copies this assay can detect is 250 copies / mL. A negative result does not preclude SARS-CoV-2 infection and should not be used as the sole basis for treatment or other patient management decisions.  A negative result may occur with improper specimen collection / handling, submission of specimen other than nasopharyngeal swab, presence of viral mutation(s) within the areas targeted by this assay, and inadequate number of viral copies (<250 copies / mL). A negative result must be combined with clinical observations, patient history, and epidemiological information.  Fact Sheet for Patients:    StrictlyIdeas.no  Fact Sheet for Healthcare Providers: BankingDealers.co.za  This test is not yet approved or  cleared by the [place] FDA and has been authorized for detection and/or diagnosis of SARS-CoV-2 by FDA under an Emergency Use Authorization (EUA).  This EUA will remain in effect (meaning this test can be used) for the duration of the COVID-19 declaration under Section 564(b)(1) of the Act, 21 U.S.C. section 360bbb-3(b)(1), unless the authorization is terminated or revoked sooner.  Performed at [facility], [place] [address] [place], [place] [address]   CBG monitoring, ED     Status: None   Collection Time: 09/12/19  3:08 AM  Result Value Ref Range   Glucose-Capillary 98 70 - 99 mg/dL    Comment: Glucose reference range applies only to samples taken after fasting for at least 8 hours.   Comment 1 Notify RN    Comment 2 Document in Chart   Comprehensive metabolic panel     Status: Abnormal   Collection Time: 09/12/19  4:43 AM  Result Value Ref Range   Sodium 131 (L) 135 - 145 mmol/L   Potassium 4.0 3.5 - 5.1 mmol/L   Chloride 96 (L) 98 - 111 mmol/L   CO2 27 22 - 32 mmol/L   Glucose, Bld 106 (H) 70 - 99 mg/dL    Comment: Glucose reference range applies only to samples taken after fasting for at least 8 hours.   BUN 32 (H) 6 - 20 mg/dL   Creatinine, Ser 1.01 (H) 0.44 - 1.00 mg/dL   Calcium 8.3 (L) 8.9 - 10.3 mg/dL   Total Protein 6.9 6.5 - 8.1 g/dL   Albumin 1.7 (L) 3.5 - 5.0 g/dL   AST 13 (L) 15 - 41 U/L   ALT 9 0 - 44 U/L   Alkaline Phosphatase 73 38 - 126 U/L   Total Bilirubin 0.6 0.3 - 1.2 mg/dL   GFR calc non Af Amer >60 >60 mL/min   GFR calc Af Amer >60 >60 mL/min   Anion gap 8 5 - 15    Comment: Performed at [place] [address], [place], [place] [address]  Protime-INR     Status: None   Collection Time: 09/12/19  4:43 AM  Result Value Ref Range   Prothrombin Time 14.7 11.4 - 15.2  seconds   INR 1.2 0.8 - 1.2    Comment: (NOTE) INR goal varies based on device and disease states. Performed at [facility], [place] [address] [place], [place] [address]   APTT     Status: Abnormal   Collection Time: 09/12/19  4:43 AM  Result Value Ref Range   aPTT 37 (H) 24 - 36 seconds    Comment:        IF BASELINE aPTT IS ELEVATED, SUGGEST PATIENT RISK ASSESSMENT BE USED TO DETERMINE APPROPRIATE ANTICOAGULANT THERAPY. Performed at [facility], [place] [address] [place], [place] [address]   Magnesium     Status: None   Collection Time: 09/12/19  4:43 AM  Result Value Ref Range   Magnesium 1.9 1.7 - 2.4 mg/dL    Comment: Performed at [facility] [address], [place], [place] [address]  Phosphorus     Status: None   Collection Time: 09/12/19  4:43 AM  Result Value Ref Range   Phosphorus 3.7 2.5 - 4.6 mg/dL    Comment: Performed at [place] [address], [place], [place] [address]  CBG monitoring, ED     Status: None   Collection Time: 09/12/19  7:47 AM  Result Value Ref Range   Glucose-Capillary 99 70 - 99 mg/dL    Comment: Glucose reference range applies only to samples taken after fasting for at least 8 hours.   Comment 1 Notify RN    Comment 2 Document in Chart   Urinalysis, Routine w reflex microscopic     Status: Abnormal   Collection Time: 09/12/19  8:42 AM  Result Value Ref Range   Color, Urine YELLOW YELLOW   APPearance HAZY (A) CLEAR   Specific Gravity, Urine 1.015 1.005 - 1.030   pH 5.0 5.0 - 8.0   Glucose, UA 150 (A) NEGATIVE mg/dL   Hgb urine dipstick LARGE (A) NEGATIVE   Bilirubin Urine NEGATIVE NEGATIVE   Ketones, ur NEGATIVE NEGATIVE mg/dL   Protein, ur >=300 (A) NEGATIVE mg/dL   Nitrite NEGATIVE NEGATIVE   Leukocytes,Ua NEGATIVE NEGATIVE   RBC / HPF 11-20 0 - 5 RBC/hpf   WBC, UA 6-10 0 - 5 WBC/hpf   Bacteria, UA FEW (A) NONE SEEN   Squamous Epithelial / LPF 0-5 0 - 5   Hyaline Casts, UA PRESENT     Comment:  Performed at [facility], [address], [place],  [address]  CBC     Status: Abnormal   Collection Time: 09/12/19  9:03 AM  Result Value Ref Range   WBC 9.9 4.0 - 10.5 K/uL   RBC 2.76 (L) 3.87 - 5.11 MIL/uL   Hemoglobin 8.0 (L) 12.0 - 15.0 g/dL   HCT 25.9 (L) 36 - 46 %   MCV 93.8 80.0 - 100.0 fL   MCH 29.0 26.0 - 34.0 pg   MCHC 30.9 30.0 - 36.0 g/dL   RDW 15.4 11.5 - 15.5 %   Platelets 363 150 - 400 K/uL   nRBC 0.0 0.0 - 0.2 %    Comment: Performed at [facility], [place] [address] [place], [place] [address]  Iron and TIBC     Status: Abnormal   Collection Time: 09/12/19  9:03 AM  Result Value Ref Range   Iron 16 (L) 28 - 170 ug/dL   TIBC 105 (L) 250 - 450 ug/dL   Saturation Ratios 15 10.4 - 31.8 %   UIBC  89 ug/dL    Comment: Performed at [facility], [place] [address], [place], [place] [address]  Procalcitonin - Baseline     Status: None   Collection Time: 09/12/19  9:03 AM  Result Value Ref Range   Procalcitonin 0.25 ng/mL    Comment:        Interpretation: PCT (Procalcitonin) <= 0.5 ng/mL: Systemic infection (sepsis) is not likely. Local bacterial infection is possible. (NOTE)       Sepsis PCT Algorithm           Lower Respiratory Tract                                      Infection PCT Algorithm    ----------------------------     ----------------------------         PCT < 0.25 ng/mL                PCT < 0.10 ng/mL          Strongly encourage             Strongly discourage   discontinuation of antibiotics    initiation of antibiotics    ----------------------------     -----------------------------       PCT 0.25 - 0.50 ng/mL            PCT 0.10 - 0.25 ng/mL               OR       >80% decrease in PCT            Discourage initiation of                                            antibiotics      Encourage discontinuation           of antibiotics    ----------------------------     -----------------------------         PCT >= 0.50 ng/mL               PCT 0.26 - 0.50 ng/mL               AND        <80% decrease in PCT             Encourage initiation of                                             antibiotics       Encourage continuation           of antibiotics    ----------------------------     -----------------------------        PCT >= 0.50 ng/mL                  PCT > 0.50 ng/mL               AND         increase in PCT                  Strongly encourage  initiation of antibiotics    Strongly encourage escalation           of antibiotics                                     -----------------------------                                           PCT <= 0.25 ng/mL                                                 OR                                        > 80% decrease in PCT                                      Discontinue / Do not initiate                                             antibiotics  Performed at [facility], [address] [place], [place] [address]   Glucose, capillary     Status: Abnormal   Collection Time: 09/12/19 12:39 PM  Result Value Ref Range   Glucose-Capillary 115 (H) 70 - 99 mg/dL    Comment: Glucose reference range applies only to samples taken after fasting for at least 8 hours.  Glucose, capillary     Status: Abnormal   Collection Time: 09/12/19  4:59 PM  Result Value Ref Range   Glucose-Capillary 141 (H) 70 - 99 mg/dL    Comment: Glucose reference range applies only to samples taken after fasting for at least 8 hours.  Glucose, capillary     Status: None   Collection Time: 09/12/19  9:05 PM  Result Value Ref Range   Glucose-Capillary 93 70 - 99 mg/dL    Comment: Glucose reference range applies only to samples taken after fasting for at least 8 hours.    Imaging / Studies: CT ABDOMEN PELVIS W CONTRAST  Result Date: 09/12/2019 CLINICAL DATA:  Fever of unknown origin EXAM: CT ABDOMEN AND PELVIS WITH CONTRAST TECHNIQUE: Multidetector CT  imaging of the abdomen and pelvis was performed using the standard protocol following bolus administration of intravenous contrast. CONTRAST:  175mL OMNIPAQUE IOHEXOL 300 MG/ML  SOLN COMPARISON:  08/18/2019 FINDINGS: Lower chest:  Stable scarring/atelectasis in the right lower lobe Hepatobiliary: Vague low-density in the subcapsular central liver measuring 1 cm, also present on prior. This is not well-defined and suggestive of abscess.Cholelithiasis. The gallstone has migrated more centrally. There is mild pericholecystic fat haziness but chronic when compared with prior. No bile duct dilatation. Pancreas: Unremarkable. Spleen: Unremarkable. Adrenals/Urinary Tract: Negative adrenals. No hydronephrosis or stone. Unremarkable bladder. Stomach/Bowel: Chronic ventral abdominal collection which spans at least 10 cm transverse dimension and 1.6 cm in thickness. Thickness has  increased from prior although the extent is not clearly increased. There is known fistula by prior fluoroscopy. No associated bowel obstruction or wall thickening. Vascular/Lymphatic: No acute vascular abnormality. Prominent retroperitoneal lymph nodes that are considered reactive. Reproductive:No pathologic findings. Other: No ascites or pneumoperitoneum. Musculoskeletal: No acute abnormalities. Chronic sacroiliac sclerosis and erosions. IMPRESSION: 1. Similar extent but increased fullness of the chronic intra-abdominal collection associated with the known enterocutaneous fistula. 2. 1 cm low-density in the subcapsular central liver that is new from March 2021 and very similar to June 2021 CT, would expect more evolution if this were an abscess. 3. Cholelithiasis. Electronically Signed   By: Monte Fantasia M.D.   On: 09/12/2019 11:20   DG CHEST PORT 1 VIEW  Result Date: 09/12/2019 CLINICAL DATA:  Low hemoglobin EXAM: PORTABLE CHEST 1 VIEW COMPARISON:  08/17/2019 FINDINGS: Cardiac shadow is stable. Right-sided PICC line is again noted and stable.  Lungs are well aerated bilaterally. No focal infiltrate or sizable effusion is seen. No bony abnormality is noted. IMPRESSION: No acute abnormality noted. Electronically Signed   By: Inez Catalina M.D.   On: 09/12/2019 08:31    Medications / Allergies: per chart  Antibiotics: Anti-infectives (From admission, onward)   Start     Dose/Rate Route Frequency Ordered Stop   09/13/19 1030  anidulafungin (ERAXIS) 100 mg in sodium chloride 0.9 % 100 mL IVPB     Discontinue     100 mg 78 mL/hr over 100 Minutes Intravenous Every 24 hours 09/12/19 0952     09/12/19 1030  anidulafungin (ERAXIS) 200 mg in sodium chloride 0.9 % 200 mL IVPB     Discontinue     200 mg 78 mL/hr over 200 Minutes Intravenous  Once 09/12/19 2482          Note: Portions of this report may have been transcribed using voice recognition software. Every effort was made to ensure accuracy; however, inadvertent computerized transcription errors may be present.   Any transcriptional errors that result from this process are unintentional.    Adin Hector, MD, FACS, MASCRS Gastrointestinal and Minimally Invasive Surgery  [facility] [address] [place], [place] [address] [phone] Fax [phone] Main/Paging  CONTACT INFORMATION: Weekday (9AM-5PM) concerns: Call [facility] main office at [phone] Weeknight (5PM-9AM) or Weekend/Holiday concerns: Check [email or web address] for General Surgery [facility] coverage (Please, do not use SecureChat as it is not reliable communication to operating surgeons for immediate patient care)      09/13/2019  7:36 AM

## 2019-09-13 NOTE — Progress Notes (Signed)
PROGRESS NOTE    Gina [name]  CWC:[id number] DOB: [birth year] DOA: 09/28/2019 PCP: [name], MD   Brief Narrative: 59 year old with past medical history significant for perforated prepyloric ulcer in 2020 status post exploratory laparotomy with [name] patch, chronic TPN via right arm PICC line, insulin-dependent diabetes, history off bilateral lower quadrant drains along with enterocutaneous fistula with colostomy bag, chronic sacral pressure ulcer stage II, schizophrenia, bilateral BKA who presents for evaluation of fever.  She has been admitted a few times this year for sepsis including MRSA bacteremia, fungemia, Candida peritonitis in April, Proteus UTI, Candida glabrata who completed Eraxis 6/26.  Presents with persistent fevers and low hemoglobin. Patient report that she was sent to the ED due to low hemoglobin.  Patient was sent from PCP office due to hypotension systolic blood pressure in the 60s, patient had an episode of nausea and vomiting.  In the ED her hemoglobin was stable around 7.7, hyponatremia, hyperglycemia.  Fecal occult was noted to be positive.  SIRS coronavirus 2 -.    Assessment & Plan:   Principal Problem:   Acute GI bleeding Active Problems:   Hyperglycemia   HTN (hypertension)   Diabetes (HCC)   Severe protein-calorie malnutrition (HCC)   Anemia of chronic disease   Sacral decubitus ulcer   Perforated gastric ulcer (HCC)   S/P BKA (below knee amputation) bilateral (HCC)   Enterocutaneous fistula   Iron deficiency anemia   Hypoalbuminemia   Anemia   Fever; unclear source: -Repeated blood cultures, UA, urine culture. -ID consulted plan to start Eraxis awaiting blood cultures result. -Chest x-ray: No acute abnormality -CT Abdomen and pelvis:Similar extent but increased fullness of the chronic intra-abdominal collection associated with the known enterocutaneous fistula. 1 cm low-density in the subcapsular central liver that is new from March 2021 and  very similar to June 2021 CT, would expect more evolution if this were an  abscess.  -Appreciate sx evaluation, no further recommendations.   Anemia: Iron deficiency  May be chronic low GI bleed. Hemoglobin is stable 7.7.  2 weeks ago was 8.5 Continue with IV Protonix. Discussed with Dr. Kinnie Feil, [facility] no further evaluation unless patient develop active GI bleed. She will need IV iron, when infection is controlled.  Hb down to 6.6---plan to transfuse one unit. No evidence of active bleeding.   History of candida glabrata fungemia;  Completed Eraxis. 6-26  Hypotension; BP normal range. Monitor. Continue with IV fluids.   Diabetes type 2: Continue with a sliding scale insulin  Hypertension: Hold amlodipine.  Continue with Lopressor.  Sacral decubitus ulcer stage  III sacrum present on admission.  Left LE stage II : Continue with wound care.   History of perforated gastric ulcer status post Grahan patch; enterocutaneous Fistula;  Continue with wound care.  Chronic Pain due to bilateral BKA: Continue with gabapentin and oxycodone Hyponatremia; Continue with IV fluids. improved    Pressure Injury 05/22/19 Coccyx Bilateral Stage 4 - Full thickness tissue loss with exposed bone, tendon or muscle. Per wound nurse, healing stage IV from prior admission (Active)  05/22/19 2104  Location: Coccyx  Location Orientation: Bilateral  Staging: Stage 4 - Full thickness tissue loss with exposed bone, tendon or muscle.  Wound Description (Comments): Per wound nurse, healing stage IV from prior admission  Present on Admission: Yes     Pressure Injury 05/22/19 Knee Right;Distal Unstageable - Full thickness tissue loss in which the base of the injury is covered by slough (yellow, tan, gray, green  or brown) and/or eschar (tan, brown or black) in the wound bed. Stump (Active)  05/22/19 2106  Location: Knee  Location Orientation: Right;Distal  Staging: Unstageable - Full thickness tissue loss in  which the base of the injury is covered by slough (yellow, tan, gray, green or brown) and/or eschar (tan, brown or black) in the wound bed.  Wound Description (Comments): Stump  Present on Admission: Yes     Pressure Injury 05/22/19 Knee Left;Distal Unstageable - Full thickness tissue loss in which the base of the injury is covered by slough (yellow, tan, gray, green or brown) and/or eschar (tan, brown or black) in the wound bed. Stump (Active)  05/22/19 2108  Location: Knee  Location Orientation: Left;Distal  Staging: Unstageable - Full thickness tissue loss in which the base of the injury is covered by slough (yellow, tan, gray, green or brown) and/or eschar (tan, brown or black) in the wound bed.  Wound Description (Comments): Stump  Present on Admission: Yes     Pressure Injury 05/22/19 Thigh Left;Mid Stage 2 -  Partial thickness loss of dermis presenting as a shallow open injury with a red, pink wound bed without slough. (Active)  05/22/19 2109  Location: Thigh  Location Orientation: Left;Mid  Staging: Stage 2 -  Partial thickness loss of dermis presenting as a shallow open injury with a red, pink wound bed without slough.  Wound Description (Comments):   Present on Admission: Yes     Pressure Injury 07/22/19 Sacrum Mid Stage 3 -  Full thickness tissue loss. Subcutaneous fat may be visible but bone, tendon or muscle are NOT exposed. (Active)  07/22/19   Location: Sacrum  Location Orientation: Mid  Staging: Stage 3 -  Full thickness tissue loss. Subcutaneous fat may be visible but bone, tendon or muscle are NOT exposed.  Wound Description (Comments):   Present on Admission: Yes     Pressure Injury 07/22/19 Leg Left Stage 3 -  Full thickness tissue loss. Subcutaneous fat may be visible but bone, tendon or muscle are NOT exposed. (Active)  07/22/19   Location: Leg  Location Orientation: Left  Staging: Stage 3 -  Full thickness tissue loss. Subcutaneous fat may be visible but bone,  tendon or muscle are NOT exposed.  Wound Description (Comments):   Present on Admission: Yes     Pressure Injury 07/22/19 Leg Right Stage 3 -  Full thickness tissue loss. Subcutaneous fat may be visible but bone, tendon or muscle are NOT exposed. (Active)  07/22/19   Location: Leg  Location Orientation: Right  Staging: Stage 3 -  Full thickness tissue loss. Subcutaneous fat may be visible but bone, tendon or muscle are NOT exposed.  Wound Description (Comments):   Present on Admission: Yes     Nutrition Problem: Moderate Malnutrition Etiology: chronic illness (EC fistula, perforated prepyloric ulcer on TPN)    Signs/Symptoms: percent weight loss, moderate fat depletion, moderate muscle depletion Percent weight loss: 9 % (in 1 mo)    Interventions: Refer to RD note for recommendations  Estimated body mass index is 18.29 kg/m as calculated from the following:   Height as of this encounter: 5\' 2"  (1.575 m).   Weight as of this encounter: 45.4 kg.   DVT prophylaxis: SCD due to concern for low hb Code Status: Full code, will need to discussed code status with patient.  Family Communication: care discussed with patient Disposition Plan:  Status is: inpatient.   Dispo: The patient is from: Home  Anticipated d/c is to: Home              Anticipated d/c date is: 3 days              Patient currently is not medically stable to d/c. Undergoing evaluation for fever, awaiting blood culture results.         Consultants:   ID  Surgery   Procedures:   none  Antimicrobials:  Eraxis  Subjective: Alert, denies pain.   Objective: Vitals:   09/13/19 0238 09/13/19 0530 09/13/19 1310 09/13/19 1345  BP: 131/67 (!) 152/76 139/70 135/72  Pulse: 76 78 73 72  Resp: 14 16 16 16   Temp: 99 F (37.2 C) 98.2 F (36.8 C) 98.2 F (36.8 C) 98.2 F (36.8 C)  TempSrc: Oral Oral Oral Oral  SpO2: 97% 100% 100% 100%  Weight:      Height:        Intake/Output  Summary (Last 24 hours) at 09/13/2019 1353 Last data filed at 09/13/2019 0900 Gross per 24 hour  Intake 1060.89 ml  Output 200 ml  Net 860.89 ml   Filed Weights   09/17/2019 1521  Weight: 45.4 kg    Examination:  General exam: NAD Respiratory system: CTA Cardiovascular system: S 1, S 2 RRR Gastrointestinal system: BS present, soft, ntenterocutaneous fistula with ostomy bag in place.  Central nervous system: Alert and oriented,  Extremities: B/L BKA  Data Reviewed: I have personally reviewed following labs and imaging studies  CBC: Recent Labs  Lab 09/12/2019 1531 09/12/19 0903 09/13/19 1047  WBC 9.1 9.9 7.2  HGB 7.7* 8.0* 6.6*  HCT 24.6* 25.9* 21.5*  MCV 91.4 93.8 93.9  PLT 365 363 262   Basic Metabolic Panel: Recent Labs  Lab 09/28/2019 1531 09/12/19 0443 09/13/19 1047  NA 131* 131* 137  K 4.1 4.0 3.8  CL 96* 96* 105  CO2 24 27 24   GLUCOSE 203* 106* 103*  BUN 34* 32* 29*  CREATININE 0.90 1.01* 1.26*  CALCIUM 8.4* 8.3* 8.1*  MG  --  1.9  --   PHOS  --  3.7  --    GFR: Estimated Creatinine Clearance: 34.9 mL/min (A) (by C-G formula based on SCr of 1.26 mg/dL (H)). Liver Function Tests: Recent Labs  Lab 09/22/2019 1531 09/12/19 0443  AST 14* 13*  ALT 10 9  ALKPHOS 83 73  BILITOT 0.5 0.6  PROT 7.5 6.9  ALBUMIN 1.9* 1.7*   No results for input(s): LIPASE, AMYLASE in the last 168 hours. No results for input(s): AMMONIA in the last 168 hours. Coagulation Profile: Recent Labs  Lab 09/12/19 0443  INR 1.2   Cardiac Enzymes: No results for input(s): CKTOTAL, CKMB, CKMBINDEX, TROPONINI in the last 168 hours. BNP (last 3 results) No results for input(s): PROBNP in the last 8760 hours. HbA1C: No results for input(s): HGBA1C in the last 72 hours. CBG: Recent Labs  Lab 09/12/19 1239 09/12/19 1659 09/12/19 2105 09/13/19 0803 09/13/19 1133  GLUCAP 115* 141* 93 122* 89   Lipid Profile: No results for input(s): CHOL, HDL, LDLCALC, TRIG, CHOLHDL, LDLDIRECT  in the last 72 hours. Thyroid Function Tests: No results for input(s): TSH, T4TOTAL, FREET4, T3FREE, THYROIDAB in the last 72 hours. Anemia Panel: Recent Labs    09/12/19 0903  TIBC 105*  IRON 16*   Sepsis Labs: Recent Labs  Lab 09/12/19 0903 09/13/19 0752  PROCALCITON 0.25 0.20    Recent Results (from the past 240 hour(s))  SARS Coronavirus 2  by RT PCR (hospital order, performed in [facility] hospital lab) Nasopharyngeal Nasopharyngeal Swab     Status: None   Collection Time: 09/13/2019 10:06 PM   Specimen: Nasopharyngeal Swab  Result Value Ref Range Status   SARS Coronavirus 2 NEGATIVE NEGATIVE Final    Comment: (NOTE) SARS-CoV-2 target nucleic acids are NOT DETECTED.  The SARS-CoV-2 RNA is generally detectable in upper and lower respiratory specimens during the acute phase of infection. The lowest concentration of SARS-CoV-2 viral copies this assay can detect is 250 copies / mL. A negative result does not preclude SARS-CoV-2 infection and should not be used as the sole basis for treatment or other patient management decisions.  A negative result may occur with improper specimen collection / handling, submission of specimen other than nasopharyngeal swab, presence of viral mutation(s) within the areas targeted by this assay, and inadequate number of viral copies (<250 copies / mL). A negative result must be combined with clinical observations, patient history, and epidemiological information.  Fact Sheet for Patients:   StrictlyIdeas.no  Fact Sheet for Healthcare Providers: BankingDealers.co.za  This test is not yet approved or  cleared by the [place] FDA and has been authorized for detection and/or diagnosis of SARS-CoV-2 by FDA under an Emergency Use Authorization (EUA).  This EUA will remain in effect (meaning this test can be used) for the duration of the COVID-19 declaration under Section 564(b)(1) of the Act,  21 U.S.C. section 360bbb-3(b)(1), unless the authorization is terminated or revoked sooner.  Performed at [facility], [place] [address], [place], [place] [address]   Urine Culture     Status: Abnormal   Collection Time: 09/12/19  8:36 AM   Specimen: Urine, Random  Result Value Ref Range Status   Specimen Description URINE, RANDOM  Final   Special Requests   Final    NONE Performed at [facility], [place] [address] [place], [place] [address]    Culture MULTIPLE SPECIES PRESENT, SUGGEST RECOLLECTION (A)  Final   Report Status 09/13/2019 FINAL  Final         Radiology Studies: CT ABDOMEN PELVIS W CONTRAST  Result Date: 09/12/2019 CLINICAL DATA:  Fever of unknown origin EXAM: CT ABDOMEN AND PELVIS WITH CONTRAST TECHNIQUE: Multidetector CT imaging of the abdomen and pelvis was performed using the standard protocol following bolus administration of intravenous contrast. CONTRAST:  167mL OMNIPAQUE IOHEXOL 300 MG/ML  SOLN COMPARISON:  08/18/2019 FINDINGS: Lower chest:  Stable scarring/atelectasis in the right lower lobe Hepatobiliary: Vague low-density in the subcapsular central liver measuring 1 cm, also present on prior. This is not well-defined and suggestive of abscess.Cholelithiasis. The gallstone has migrated more centrally. There is mild pericholecystic fat haziness but chronic when compared with prior. No bile duct dilatation. Pancreas: Unremarkable. Spleen: Unremarkable. Adrenals/Urinary Tract: Negative adrenals. No hydronephrosis or stone. Unremarkable bladder. Stomach/Bowel: Chronic ventral abdominal collection which spans at least 10 cm transverse dimension and 1.6 cm in thickness. Thickness has increased from prior although the extent is not clearly increased. There is known fistula by prior fluoroscopy. No associated bowel obstruction or wall thickening. Vascular/Lymphatic: No acute vascular abnormality. Prominent retroperitoneal lymph nodes that are considered reactive.  Reproductive:No pathologic findings. Other: No ascites or pneumoperitoneum. Musculoskeletal: No acute abnormalities. Chronic sacroiliac sclerosis and erosions. IMPRESSION: 1. Similar extent but increased fullness of the chronic intra-abdominal collection associated with the known enterocutaneous fistula. 2. 1 cm low-density in the subcapsular central liver that is new from March 2021 and very similar  to June 2021 CT, would expect more evolution if this were an abscess. 3. Cholelithiasis. Electronically Signed   By: Monte Fantasia M.D.   On: 09/12/2019 11:20   DG CHEST PORT 1 VIEW  Result Date: 09/12/2019 CLINICAL DATA:  Low hemoglobin EXAM: PORTABLE CHEST 1 VIEW COMPARISON:  08/17/2019 FINDINGS: Cardiac shadow is stable. Right-sided PICC line is again noted and stable. Lungs are well aerated bilaterally. No focal infiltrate or sizable effusion is seen. No bony abnormality is noted. IMPRESSION: No acute abnormality noted. Electronically Signed   By: Inez Catalina M.D.   On: 09/12/2019 08:31        Scheduled Meds:  Chlorhexidine Gluconate Cloth  6 each Topical Daily   cholestyramine  4 g Oral BID   cyanocobalamin  1,000 mcg Intramuscular Once   feeding supplement  1 Container Oral TID BM   gabapentin  300 mg Oral BID   insulin aspart  0-15 Units Subcutaneous TID WC   insulin aspart  0-5 Units Subcutaneous QHS   metoprolol tartrate  50 mg Oral BID   pantoprazole  40 mg Intravenous Q12H   sodium chloride flush  10-40 mL Intracatheter Q12H   Continuous Infusions:  sodium chloride 100 mL/hr at 09/13/19 0659   anidulafungin 100 mg (09/13/19 1030)   anidulafungin       LOS: 1 day    Time spent: 35 minutes.     Elmarie Shiley, MD [facility]   If 7PM-7AM, please contact night-coverage [email or web address]  09/13/2019, 1:53 PM

## 2019-09-13 NOTE — Progress Notes (Signed)
CRITICAL VALUE ALERT  Critical Value: hgb 6.6  Date & Time Notied: 09/13/2019 1113  Provider Notified: Niel Hummer, MD  Orders Received/Actions taken: STAT CBC and 1 unit of PRBCs

## 2019-09-13 NOTE — Consult Note (Addendum)
[place] Nurse Consult Note: Reason for Consult:Sacral healing Stage 4 pressure injury. A photo was added to the EMR today of the wound by [facility] PA Kasandra Knudsen. Wound type:Pressure Pressure Injury POA: Yes Measurement: 5.5cm x 4cm x 0.4cm Wound bed: Pink, yellow (30%), moist Drainage (amount, consistency, odor) small serous to light yellow Periwound:with evidence of previous wound healing (contraction, scarring) Dressing procedure/placement/frequency: I will provide guidance for the Nursing staff using collagenase ointment for enzymatic debridement topped with NS moistened gauze dressings and daily changes. PRN changes for soiling are ordered. Patient is using a PurWick for the management of urinary incontinence. Patient reports she has a mattress replacement at home, I will provide an order for one while here in house. A pressure redistribution chair pad is also requested for her use while OOB to chair.  The patient is known to our service for her EC fistula that is managed with a small Eakin pouch Kellie Simmering # [phone]) connected to a bedside urinary drainage bag. This is currently intact and to be changed PRN.  [facility] nursing team will not follow, but will remain available to this patient, the nursing and medical teams.  Please re-consult if needed. Thanks, Maudie Flakes, MSN, RN, [place], Arther Abbott  Pager# [phone]

## 2019-09-13 NOTE — Plan of Care (Signed)

## 2019-09-13 NOTE — Progress Notes (Signed)
Subjective: "My butt hurts"   Antibiotics:  Anti-infectives (From admission, onward)   Start     Dose/Rate Route Frequency Ordered Stop   09/13/19 1030  anidulafungin (ERAXIS) 100 mg in sodium chloride 0.9 % 100 mL IVPB     Discontinue     100 mg 78 mL/hr over 100 Minutes Intravenous Every 24 hours 09/12/19 0952     09/12/19 1030  anidulafungin (ERAXIS) 200 mg in sodium chloride 0.9 % 200 mL IVPB     Discontinue     200 mg 78 mL/hr over 200 Minutes Intravenous  Once 09/12/19 5625        Medications: Scheduled Meds: . Chlorhexidine Gluconate Cloth  6 each Topical Daily  . cholestyramine  4 g Oral BID  . cyanocobalamin  1,000 mcg Intramuscular Once  . feeding supplement  1 Container Oral TID BM  . gabapentin  300 mg Oral BID  . insulin aspart  0-15 Units Subcutaneous TID WC  . insulin aspart  0-5 Units Subcutaneous QHS  . metoprolol tartrate  50 mg Oral BID  . pantoprazole  40 mg Intravenous Q12H  . sodium chloride flush  10-40 mL Intracatheter Q12H   Continuous Infusions: . sodium chloride 100 mL/hr at 09/13/19 0659  . anidulafungin 100 mg (09/13/19 1030)  . anidulafungin     PRN Meds:.acetaminophen, ondansetron (ZOFRAN) IV, oxyCODONE, sodium chloride flush    Objective: Weight change:   Intake/Output Summary (Last 24 hours) at 09/13/2019 1549 Last data filed at 09/13/2019 1512 Gross per 24 hour  Intake 2137.52 ml  Output 200 ml  Net 1937.52 ml   Blood pressure (!) 147/85, pulse 71, temperature 97.7 F (36.5 C), temperature source Axillary, resp. rate 16, height 5\' 2"  (1.575 m), weight 45.4 kg, last menstrual period 03/29/2015, SpO2 100 %. Temp:  [97.7 F (36.5 C)-99 F (37.2 C)] 97.7 F (36.5 C) (07/03 1438) Pulse Rate:  [71-78] 71 (07/03 1438) Resp:  [14-16] 16 (07/03 1438) BP: (114-152)/(65-85) 147/85 (07/03 1438) SpO2:  [97 %-100 %] 100 % (07/03 1438)  Physical Exam: General: Alert and awake, oriented x3, not in any acute distress. HEENT:  anicteric sclera, EOMI CVS regular rate, normal  Chest: , no wheezing, no respiratory distress Abdomen: soft, ostomy in place Extremities: did not examine BKA site Lower back decubitus ulcer with fecal soiling 09/13/2019:    Neuro: nonfocal  CBC:    BMET Recent Labs    09/12/19 0443 09/13/19 1047  NA 131* 137  K 4.0 3.8  CL 96* 105  CO2 27 24  GLUCOSE 106* 103*  BUN 32* 29*  CREATININE 1.01* 1.26*  CALCIUM 8.3* 8.1*     Liver Panel  Recent Labs    09/23/2019 1531 09/12/19 0443  PROT 7.5 6.9  ALBUMIN 1.9* 1.7*  AST 14* 13*  ALT 10 9  ALKPHOS 83 73  BILITOT 0.5 0.6       Sedimentation Rate No results for input(s): ESRSEDRATE in the last 72 hours. C-Reactive Protein No results for input(s): CRP in the last 72 hours.  Micro Results: Recent Results (from the past 720 hour(s))  SARS Coronavirus 2 by RT PCR (hospital order, performed in [facility] hospital lab) Nasopharyngeal Nasopharyngeal Swab     Status: None   Collection Time: 08/17/19  8:55 AM   Specimen: Nasopharyngeal Swab  Result Value Ref Range Status   SARS Coronavirus 2 NEGATIVE NEGATIVE Final    Comment: (NOTE) SARS-CoV-2 target nucleic acids are  NOT DETECTED. The SARS-CoV-2 RNA is generally detectable in upper and lower respiratory specimens during the acute phase of infection. The lowest concentration of SARS-CoV-2 viral copies this assay can detect is 250 copies / mL. A negative result does not preclude SARS-CoV-2 infection and should not be used as the sole basis for treatment or other patient management decisions.  A negative result may occur with improper specimen collection / handling, submission of specimen other than nasopharyngeal swab, presence of viral mutation(s) within the areas targeted by this assay, and inadequate number of viral copies (<250 copies / mL). A negative result must be combined with clinical observations, patient history, and epidemiological information. Fact  Sheet for Patients:   StrictlyIdeas.no Fact Sheet for Healthcare Providers: BankingDealers.co.za This test is not yet approved or cleared  by the [place] FDA and has been authorized for detection and/or diagnosis of SARS-CoV-2 by FDA under an Emergency Use Authorization (EUA).  This EUA will remain in effect (meaning this test can be used) for the duration of the COVID-19 declaration under Section 564(b)(1) of the Act, 21 U.S.C. section 360bbb-3(b)(1), unless the authorization is terminated or revoked sooner. Performed at [facility], [place] [address], [place], [place] [address]   Urine culture     Status: Abnormal   Collection Time: 08/17/19  9:05 AM   Specimen: In/Out Cath Urine  Result Value Ref Range Status   Specimen Description IN/OUT CATH URINE  Final   Special Requests   Final    NONE Performed at [facility], [place] [address], [place], [place] [address]    Culture >=100,000 COLONIES/mL PROTEUS MIRABILIS (A)  Final   Report Status 08/20/2019 FINAL  Final   Organism ID, Bacteria PROTEUS MIRABILIS (A)  Final      Susceptibility   Proteus mirabilis - MIC*    AMPICILLIN <=2 SENSITIVE Sensitive     CEFAZOLIN <=4 SENSITIVE Sensitive     CEFTRIAXONE <=1 SENSITIVE Sensitive     CIPROFLOXACIN >=4 RESISTANT Resistant     GENTAMICIN <=1 SENSITIVE Sensitive     IMIPENEM 2 SENSITIVE Sensitive     NITROFURANTOIN 128 RESISTANT Resistant     TRIMETH/SULFA >=320 RESISTANT Resistant     AMPICILLIN/SULBACTAM <=2 SENSITIVE Sensitive     PIP/TAZO <=4 SENSITIVE Sensitive     * >=100,000 COLONIES/mL PROTEUS MIRABILIS  Blood Culture (routine x 2)     Status: Abnormal   Collection Time: 08/17/19  9:50 AM   Specimen: BLOOD LEFT ARM  Result Value Ref Range Status   Specimen Description BLOOD LEFT ARM  Final   Special Requests   Final    BOTTLES DRAWN AEROBIC AND ANAEROBIC Blood Culture adequate volume   Culture  Setup Time    Final    BUDDING YEAST SEEN IN BOTH AEROBIC AND ANAEROBIC BOTTLES CRITICAL RESULT CALLED TO, READ BACK BY AND VERIFIED WITH: Park Breed [id number] FCP Performed at [facility], [address] [place], [place] [address]    Culture CANDIDA GLABRATA (A)  Final   Report Status 08/21/2019 FINAL  Final  Blood Culture ID Panel (Reflexed)     Status: Abnormal   Collection Time: 08/17/19  9:50 AM  Result Value Ref Range Status   Enterococcus species NOT DETECTED NOT DETECTED Final   Listeria monocytogenes NOT DETECTED NOT DETECTED Final   Staphylococcus species NOT DETECTED NOT DETECTED Final   Staphylococcus aureus (BCID) NOT DETECTED NOT DETECTED Final   Streptococcus species NOT DETECTED  NOT DETECTED Final   Streptococcus agalactiae NOT DETECTED NOT DETECTED Final   Streptococcus pneumoniae NOT DETECTED NOT DETECTED Final   Streptococcus pyogenes NOT DETECTED NOT DETECTED Final   Acinetobacter baumannii NOT DETECTED NOT DETECTED Final   Enterobacteriaceae species NOT DETECTED NOT DETECTED Final   Enterobacter cloacae complex NOT DETECTED NOT DETECTED Final   Escherichia coli NOT DETECTED NOT DETECTED Final   Klebsiella oxytoca NOT DETECTED NOT DETECTED Final   Klebsiella pneumoniae NOT DETECTED NOT DETECTED Final   Proteus species NOT DETECTED NOT DETECTED Final   Serratia marcescens NOT DETECTED NOT DETECTED Final   Haemophilus influenzae NOT DETECTED NOT DETECTED Final   Neisseria meningitidis NOT DETECTED NOT DETECTED Final   Pseudomonas aeruginosa NOT DETECTED NOT DETECTED Final   Candida albicans NOT DETECTED NOT DETECTED Final   Candida glabrata DETECTED (A) NOT DETECTED Final    Comment: CRITICAL RESULT CALLED TO, READ BACK BY AND VERIFIED WITH: PHARMD JEREMY F. [phone] FCP    Candida krusei NOT DETECTED NOT DETECTED Final   Candida parapsilosis NOT DETECTED NOT DETECTED Final   Candida tropicalis NOT DETECTED NOT DETECTED Final    Comment: Performed at [facility], [address], [place], [place] [address]  Blood Culture (routine x 2)     Status: Abnormal   Collection Time: 08/17/19  1:12 PM   Specimen: BLOOD LEFT ARM  Result Value Ref Range Status   Specimen Description BLOOD LEFT ARM  Final   Special Requests   Final    BOTTLES DRAWN AEROBIC ONLY Blood Culture adequate volume   Culture  Setup Time   Final    AEROBIC BOTTLE ONLY YEAST CRITICAL VALUE NOTED.  VALUE IS CONSISTENT WITH PREVIOUSLY REPORTED AND CALLED VALUE. Performed at [facility], [place] [address] [place], [place] [address]    Culture CANDIDA GLABRATA (A)  Final   Report Status 08/21/2019 FINAL  Final  MRSA PCR Screening     Status: Abnormal   Collection Time: 08/17/19  8:23 PM   Specimen: Nasal Mucosa; Nasopharyngeal  Result Value Ref Range Status   MRSA by PCR POSITIVE (A) NEGATIVE Final    Comment:        The GeneXpert MRSA Assay (FDA approved for NASAL specimens only), is one component of a comprehensive MRSA colonization surveillance program. It is not intended to diagnose MRSA infection nor to guide or monitor treatment for MRSA infections. RESULT CALLED TO, READ BACK BY AND VERIFIED WITH: J. MILLER,CHARGE RN [id number] 08/17/2019 Mena Goes Performed at [facility], [place] [address] [place], [place] [address]   Culture, blood (routine x 2)     Status: Abnormal   Collection Time: 08/19/19  5:56 AM   Specimen: BLOOD LEFT HAND  Result Value Ref Range Status   Specimen Description BLOOD LEFT HAND  Final   Special Requests   Final    BOTTLES DRAWN AEROBIC AND ANAEROBIC Blood Culture adequate volume   Culture  Setup Time   Final    ANAEROBIC BOTTLE ONLY YEAST CRITICAL VALUE NOTED.  VALUE IS CONSISTENT WITH PREVIOUSLY REPORTED AND CALLED VALUE. Performed at [facility], [place] [address], [place], [place] [address]    Culture CANDIDA GLABRATA (A)  Final   Report Status 08/26/2019 FINAL  Final  Culture, blood (routine x 2)     Status: Abnormal    Collection Time: 08/19/19  5:56 AM   Specimen: BLOOD LEFT HAND  Result Value Ref Range  Status   Specimen Description BLOOD LEFT HAND  Final   Special Requests   Final    BOTTLES DRAWN AEROBIC AND ANAEROBIC Blood Culture adequate volume   Culture  Setup Time (A)  Final    YEAST AEROBIC BOTTLE ONLY CRITICAL RESULT CALLED TO, READ BACK BY AND VERIFIED WITH: PHARMD Y PATEL [id number] AT 39 AM BY CM Performed at [facility], [place] [address] [place], [place] [address]    Culture CANDIDA GLABRATA (A)  Final   Report Status 08/24/2019 FINAL  Final  Culture, blood (routine x 2)     Status: None   Collection Time: 08/23/19 11:17 AM   Specimen: BLOOD LEFT FOREARM  Result Value Ref Range Status   Specimen Description BLOOD LEFT FOREARM  Final   Special Requests   Final    BOTTLES DRAWN AEROBIC ONLY Blood Culture adequate volume   Culture   Final    NO GROWTH 5 DAYS Performed at [facility], [place] [address], [place], [place] [address]    Report Status 08/28/2019 FINAL  Final  Culture, blood (routine x 2)     Status: None   Collection Time: 08/23/19 11:22 AM   Specimen: BLOOD LEFT FOREARM  Result Value Ref Range Status   Specimen Description BLOOD LEFT FOREARM  Final   Special Requests   Final    BOTTLES DRAWN AEROBIC ONLY Blood Culture adequate volume   Culture   Final    NO GROWTH 5 DAYS Performed at [facility], [place] [address] [place], [place] [address]    Report Status 08/28/2019 FINAL  Final  SARS Coronavirus 2 by RT PCR (hospital order, performed in [facility] hospital lab) Nasopharyngeal Nasopharyngeal Swab     Status: None   Collection Time: 10/02/2019 10:06 PM   Specimen: Nasopharyngeal Swab  Result Value Ref Range Status   SARS Coronavirus 2 NEGATIVE NEGATIVE Final    Comment: (NOTE) SARS-CoV-2 target nucleic acids are NOT DETECTED.  The SARS-CoV-2 RNA is generally detectable in upper and lower respiratory specimens during the acute phase of infection.  The lowest concentration of SARS-CoV-2 viral copies this assay can detect is 250 copies / mL. A negative result does not preclude SARS-CoV-2 infection and should not be used as the sole basis for treatment or other patient management decisions.  A negative result may occur with improper specimen collection / handling, submission of specimen other than nasopharyngeal swab, presence of viral mutation(s) within the areas targeted by this assay, and inadequate number of viral copies (<250 copies / mL). A negative result must be combined with clinical observations, patient history, and epidemiological information.  Fact Sheet for Patients:   StrictlyIdeas.no  Fact Sheet for Healthcare Providers: BankingDealers.co.za  This test is not yet approved or  cleared by the [place] FDA and has been authorized for detection and/or diagnosis of SARS-CoV-2 by FDA under an Emergency Use Authorization (EUA).  This EUA will remain in effect (meaning this test can be used) for the duration of the COVID-19 declaration under Section 564(b)(1) of the Act, 21 U.S.C. section 360bbb-3(b)(1), unless the authorization is terminated or revoked sooner.  Performed at [facility], [place] [address] [place], [place] [address]   Culture, blood (Routine X 2) w Reflex to ID Panel     Status: None (Preliminary result)   Collection Time: 09/12/19  4:05 AM   Specimen: BLOOD  Result Value Ref Range Status   Specimen Description BLOOD PICC  LINE  Final   Special Requests   Final    BOTTLES DRAWN AEROBIC AND ANAEROBIC Blood Culture results may not be optimal due to an excessive volume of blood received in culture bottles   Culture   Final    NO GROWTH 1 DAY Performed at [place] [address] [place], [place] [address]    Report Status PENDING  Incomplete  Urine Culture     Status: Abnormal   Collection Time: 09/12/19  8:36 AM   Specimen: Urine,  Random  Result Value Ref Range Status   Specimen Description URINE, RANDOM  Final   Special Requests   Final    NONE Performed at [facility], [place] [address], [place], [place] [address]    Culture MULTIPLE SPECIES PRESENT, SUGGEST RECOLLECTION (A)  Final   Report Status 09/13/2019 FINAL  Final  Culture, blood (Routine X 2) w Reflex to ID Panel     Status: None (Preliminary result)   Collection Time: 09/12/19 12:14 PM   Specimen: BLOOD  Result Value Ref Range Status   Specimen Description BLOOD LEFT ANTECUBITAL  Final   Special Requests   Final    BOTTLES DRAWN AEROBIC AND ANAEROBIC Blood Culture results may not be optimal due to an inadequate volume of blood received in culture bottles   Culture   Final    NO GROWTH 1 DAY Performed at [facility], [place] [address], [place], [place] [address]    Report Status PENDING  Incomplete    Studies/Results: CT ABDOMEN PELVIS W CONTRAST  Result Date: 09/12/2019 CLINICAL DATA:  Fever of unknown origin EXAM: CT ABDOMEN AND PELVIS WITH CONTRAST TECHNIQUE: Multidetector CT imaging of the abdomen and pelvis was performed using the standard protocol following bolus administration of intravenous contrast. CONTRAST:  176mL OMNIPAQUE IOHEXOL 300 MG/ML  SOLN COMPARISON:  08/18/2019 FINDINGS: Lower chest:  Stable scarring/atelectasis in the right lower lobe Hepatobiliary: Vague low-density in the subcapsular central liver measuring 1 cm, also present on prior. This is not well-defined and suggestive of abscess.Cholelithiasis. The gallstone has migrated more centrally. There is mild pericholecystic fat haziness but chronic when compared with prior. No bile duct dilatation. Pancreas: Unremarkable. Spleen: Unremarkable. Adrenals/Urinary Tract: Negative adrenals. No hydronephrosis or stone. Unremarkable bladder. Stomach/Bowel: Chronic ventral abdominal collection which spans at least 10 cm transverse dimension and 1.6 cm in thickness. Thickness has  increased from prior although the extent is not clearly increased. There is known fistula by prior fluoroscopy. No associated bowel obstruction or wall thickening. Vascular/Lymphatic: No acute vascular abnormality. Prominent retroperitoneal lymph nodes that are considered reactive. Reproductive:No pathologic findings. Other: No ascites or pneumoperitoneum. Musculoskeletal: No acute abnormalities. Chronic sacroiliac sclerosis and erosions. IMPRESSION: 1. Similar extent but increased fullness of the chronic intra-abdominal collection associated with the known enterocutaneous fistula. 2. 1 cm low-density in the subcapsular central liver that is new from March 2021 and very similar to June 2021 CT, would expect more evolution if this were an abscess. 3. Cholelithiasis. Electronically Signed   By: Monte Fantasia M.D.   On: 09/12/2019 11:20   DG CHEST PORT 1 VIEW  Result Date: 09/12/2019 CLINICAL DATA:  Low hemoglobin EXAM: PORTABLE CHEST 1 VIEW COMPARISON:  08/17/2019 FINDINGS: Cardiac shadow is stable. Right-sided PICC line is again noted and stable. Lungs are well aerated bilaterally. No focal infiltrate or sizable effusion is seen. No bony abnormality is noted. IMPRESSION: No acute abnormality noted. Electronically Signed   By: Elta Guadeloupe  Lukens M.D.   On: 09/12/2019 08:31      Assessment/Plan:  INTERVAL HISTORY: patient now afebrile   Principal Problem:   Acute GI bleeding Active Problems:   Hyperglycemia   HTN (hypertension)   Diabetes (HCC)   Severe protein-calorie malnutrition (HCC)   Anemia of chronic disease   Sacral decubitus ulcer   Perforated gastric ulcer (HCC)   S/P BKA (below knee amputation) bilateral (HCC)   Enterocutaneous fistula   Iron deficiency anemia   Hypoalbuminemia   Anemia    Gina [name] is a 59 y.o. female with 59 y.o. female with diabetes and schizophrenia who suffered a gastric perforation last November. She underwent patch repair of the perforation. She had  diffuse peritonitis and Candida glabrata fungemia. 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 She has undergone colostomy. She has had a PICC for TPN ever since. She was hospitalized again in March with MRSA bacteremia. She was treated for a Proteus UTI in April. She was admitted again on 08/17/2019 with candidemia secondary to Candida glabrata.  Repeat blood cultures were positive on 08/19/2019 but became negative by 08/23/2019.  She was discharged on caspofungin after her PICC was replaced and completed therapy on 09/09/2019. She started having recurrent fever toward the end of her caspofungin therapy.   CT scan shows persistent intrabdominal fluid collection present in connection with EC fistula  She has subcapsular fluid that radiology do not think is an abscess  --continue echinocandin for now --if she fevers again ? IR or general surgery can get at the fluid collection connected to her EC fistula and get culture?      LOS: 1 day   Alcide Evener 09/13/2019, 3:49 PM

## 2019-09-13 NOTE — Evaluation (Signed)
Occupational Therapy Evaluation Patient Details Name: Gina [name] MRN: [id number] DOB: 06-06-1960 Today's Date: 09/13/2019    History of Present Illness Pt is a 59 year old with past medical history significant for perforated prepyloric ulcer in 2020 status post exploratory laparotomy with [name] patch, chronic TPN via right arm PICC line, insulin-dependent diabetes, history off bilateral lower quadrant drains along with enterocutaneous fistula with colostomy bag, chronic sacral pressure ulcer stage II, schizophrenia, bilateral BKA who presents for evaluation of fever of unclear source. Also with anemia thought to be due to chronic GI bleed.    Clinical Impression   PTA pt living at home with assist from family members. Pt requires max-total care at baseline. She has had many recent hospitalization admissions. At time of eval, pt required mod A for bed mobility and max A +2 for AP transfer to chair. She c/o sacral pain due to wound. Pt presents with poor understanding/problem solving of SNF recommendation. She also requires increased processing time and cues for problem solving (baseline). Offered education as to why pt needs higher level of care to reduce readmissions and improve BADL independence. Recommend SNF at d/c, will continue to follow per POC listed below.     Follow Up Recommendations  SNF (pt likely to refuse, will need max HH)    Equipment Recommendations  None recommended by OT    Recommendations for Other Services       Precautions / Restrictions Precautions Precautions: Fall;Other (comment) Precaution Comments: colostomy, bilateral BKA Restrictions Weight Bearing Restrictions: No      Mobility Bed Mobility Overal bed mobility: Needs Assistance Bed Mobility: Supine to Sit     Supine to sit: Mod assist     General bed mobility comments: Supine> long sit with modA for pulling trunk to upright  Transfers Overall transfer level: Needs assistance Equipment used:  None Transfers: Comptroller transfers: Max assist;+2 physical assistance;+2 safety/equipment   General transfer comment: MaxA + 2 for anterior to posterior transfer, with use of bed pad to guide hips, cues for hand positioning.     Balance Overall balance assessment: Needs assistance Sitting-balance support: Bilateral upper extremity supported Sitting balance-Leahy Scale: Poor Sitting balance - Comments: reliant on BUE support and supervision Postural control: Right lateral lean                                 ADL either performed or assessed with clinical judgement   ADL Overall ADL's : Needs assistance/impaired Eating/Feeding: Set up;Sitting   Grooming: Set up;Sitting   Upper Body Bathing: Minimal assistance;Sitting   Lower Body Bathing: Moderate assistance;Sitting/lateral leans   Upper Body Dressing : Set up;Sitting   Lower Body Dressing: Maximal assistance;Sitting/lateral leans;Sit to/from stand   Toilet Transfer: Maximal assistance;+2 for physical assistance;+2 for safety/equipment Toilet Transfer Details (indicate cue type and reason): simulated with chair transfer- max A +2 AP transfer. Cues for hand placement Toileting- Clothing Manipulation and Hygiene: Maximal assistance Toileting - Clothing Manipulation Details (indicate cue type and reason): uses brief for urination, needs assist to manage ostomy bag     Functional mobility during ADLs: Maximal assistance;+2 for physical assistance;+2 for safety/equipment (AP t/f to recliner)       Vision Patient Visual Report: No change from baseline       Perception     Praxis      Pertinent Vitals/Pain Pain Assessment: Faces Faces Pain Scale: Hurts little  more Pain Location: sacral area Pain Descriptors / Indicators: Sore Pain Intervention(s): Limited activity within patient's tolerance;Monitored during session;Repositioned     Hand Dominance Right    Extremity/Trunk Assessment Upper Extremity Assessment Upper Extremity Assessment: Generalized weakness LUE Deficits / Details: L hand finger contractures noted, however, baseline for pt. finger amp   Lower Extremity Assessment Lower Extremity Assessment: Defer to PT evaluation RLE Deficits / Details: BKA, lacking ~5 degrees from neutral extension LLE Deficits / Details: BKA, ROM WFL       Communication Communication Communication: No difficulties   Cognition Arousal/Alertness: Awake/alert Behavior During Therapy: WFL for tasks assessed/performed Overall Cognitive Status: History of cognitive impairments - at baseline Area of Impairment: Problem solving;Awareness;Safety/judgement;Following commands                       Following Commands: Follows one step commands consistently;Follows one step commands with increased time Safety/Judgement: Decreased awareness of safety;Decreased awareness of deficits Awareness: Emergent Problem Solving: Slow processing;Requires verbal cues;Requires tactile cues General Comments: Decreased awareness into deficits and functional impact. Follows majority of 1 step commands, cues for problem solving provided   General Comments       Exercises     Shoulder Instructions      Home Living Family/patient expects to be discharged to:: Private residence Living Arrangements: Children Available Help at Discharge: Family;Available 24 hours/day Type of Home: Apartment Home Access: Level entry     Home Layout: One level     Bathroom Shower/Tub: Teacher, early years/pre: Standard Bathroom Accessibility: No   Home Equipment: Hospital bed;Wheelchair - manual;Other (comment) (hoyer)   Additional Comments: Has an aide for 3 hours/day, 7 days/week. Has hoyer lift but pt states family just "picks her up"      Prior Functioning/Environment Level of Independence: Needs assistance  Gait / Transfers Assistance Needed: family assists  with transfers to w/c  ADL's / Homemaking Assistance Needed: daughter helps with dressing/bathing. Reports using briefs for toileting- has ostomy bag            OT Problem List: Decreased strength;Decreased knowledge of use of DME or AE;Decreased activity tolerance;Decreased cognition;Impaired balance (sitting and/or standing);Decreased safety awareness;Pain      OT Treatment/Interventions: Self-care/ADL training;Therapeutic exercise;Patient/family education;Balance training;Energy conservation;Therapeutic activities;DME and/or AE instruction;Cognitive remediation/compensation    OT Goals(Current goals can be found in the care plan section) Acute Rehab OT Goals Patient Stated Goal: be more independent with transfers OT Goal Formulation: With patient Time For Goal Achievement: 09/27/19 Potential to Achieve Goals: Good  OT Frequency: Min 2X/week   Barriers to D/C:            Co-evaluation PT/OT/SLP Co-Evaluation/Treatment: Yes Reason for Co-Treatment: For patient/therapist safety;To address functional/ADL transfers   OT goals addressed during session: ADL's and self-care;Strengthening/ROM      AM-PAC OT "6 Clicks" Daily Activity     Outcome Measure Help from another person eating meals?: A Little Help from another person taking care of personal grooming?: A Little Help from another person toileting, which includes using toliet, bedpan, or urinal?: A Lot Help from another person bathing (including washing, rinsing, drying)?: A Lot Help from another person to put on and taking off regular upper body clothing?: A Little Help from another person to put on and taking off regular lower body clothing?: A Lot 6 Click Score: 15   End of Session Nurse Communication: Mobility status;Other (comment) (educated NT on AP t/f)  Activity Tolerance: Patient tolerated treatment well  Patient left: in chair;with call bell/phone within reach                   Time: 1027-1048 OT Time  Calculation (min): 21 min Charges:  OT General Charges $OT Visit: 1 Visit OT Evaluation $OT Eval Moderate Complexity: [place], MSOT, OTR/L Acute Rehabilitation Services [facility] Office Number: [phone] Pager: [phone]  Zenovia Jarred 09/13/2019, 11:32 AM

## 2019-09-13 NOTE — Evaluation (Signed)
Physical Therapy Evaluation Patient Details Name: Gina [name] MRN: [id number] DOB: 1960-06-16 Today's Date: 09/13/2019   History of Present Illness  Pt is a 59 year old with past medical history significant for perforated prepyloric ulcer in 2020 status post exploratory laparotomy with [name] patch, chronic TPN via right arm PICC line, insulin-dependent diabetes, history off bilateral lower quadrant drains along with enterocutaneous fistula with colostomy bag, chronic sacral pressure ulcer stage II, schizophrenia, bilateral BKA who presents for evaluation of fever of unclear source. Also with anemia thought to be due to chronic GI bleed.   Clinical Impression  Prior to admission, pt lives with her children, requires assist with anterior to posterior transfers and wheelchair mobility. She presents with decreased functional mobility secondary to debility/deconditioning, generalized weakness, decreased ROM, and balance deficits. Requiring two person maximal assist for transfers. She is fairly sedentary at home and has had frequent hospital readmissions. Recommending SNF to maximize functional mobility and decrease caregiver burden.     Follow Up Recommendations SNF    Equipment Recommendations  None recommended by PT (has all DME)   Recommendations for Other Services       Precautions / Restrictions Precautions Precautions: Fall;Other (comment) Precaution Comments: colostomy, bilateral BKA Restrictions Weight Bearing Restrictions: No      Mobility  Bed Mobility Overal bed mobility: Needs Assistance Bed Mobility: Supine to Sit     Supine to sit: Mod assist     General bed mobility comments: Supine> long sit with modA for pulling trunk to upright  Transfers Overall transfer level: Needs assistance Equipment used: None Transfers: Comptroller transfers: Max assist;+2 physical assistance;+2 safety/equipment   General transfer comment:  MaxA + 2 for anterior to posterior transfer, with use of bed pad to guide hips, cues for hand positioning.   Ambulation/Gait             General Gait Details: unable  Stairs            Wheelchair Mobility    Modified Rankin (Stroke Patients Only)       Balance Overall balance assessment: Needs assistance Sitting-balance support: Bilateral upper extremity supported Sitting balance-Leahy Scale: Poor Sitting balance - Comments: reliant on BUE support and supervision                                     Pertinent Vitals/Pain Pain Assessment: Faces Faces Pain Scale: Hurts little more Pain Location: bottom Pain Descriptors / Indicators: Sore Pain Intervention(s): Limited activity within patient's tolerance;Monitored during session    Home Living Family/patient expects to be discharged to:: Private residence Living Arrangements: Children Available Help at Discharge: Family;Available 24 hours/day Type of Home: Apartment Home Access: Level entry     Home Layout: One level Home Equipment: Hospital bed;Wheelchair - manual;Other (comment) (hoyer lift) Additional Comments: Has an aide for 3 hours/day, 7 days/week. Has hoyer lift    Prior Function Level of Independence: Needs assistance   Gait / Transfers Assistance Needed: family assists with transfers to w/c   ADL's / Homemaking Assistance Needed: daughter helps with dressing/bathing. Reports using briefs for toileting- has ostomy bag        Hand Dominance   Dominant Hand: Right    Extremity/Trunk Assessment   Upper Extremity Assessment Upper Extremity Assessment: Defer to OT evaluation    Lower Extremity Assessment Lower Extremity Assessment: RLE deficits/detail;LLE deficits/detail RLE Deficits / Details: BKA,  lacking ~5 degrees from neutral extension LLE Deficits / Details: BKA, ROM WFL       Communication   Communication: No difficulties  Cognition Arousal/Alertness:  Awake/alert Behavior During Therapy: WFL for tasks assessed/performed Overall Cognitive Status: History of cognitive impairments - at baseline Area of Impairment: Problem solving;Awareness;Safety/judgement                         Safety/Judgement: Decreased awareness of safety;Decreased awareness of deficits Awareness: Emergent Problem Solving: Slow processing;Requires verbal cues;Requires tactile cues General Comments: Decreased awareness into deficits and functional impact. Follows majority of 1 step commands, cues for problem solving provided      General Comments      Exercises     Assessment/Plan    PT Assessment Patient needs continued PT services  PT Problem List Decreased strength;Decreased safety awareness;Decreased mobility;Decreased activity tolerance;Decreased cognition;Decreased balance;Decreased knowledge of use of DME;Pain;Decreased skin integrity       PT Treatment Interventions DME instruction;Therapeutic activities;Therapeutic exercise;Patient/family education;Balance training;Functional mobility training;Neuromuscular re-education    PT Goals (Current goals can be found in the Care Plan section)  Acute Rehab PT Goals Patient Stated Goal: be more independent with transfers PT Goal Formulation: With patient Time For Goal Achievement: 09/27/19 Potential to Achieve Goals: Fair    Frequency Min 2X/week   Barriers to discharge        Co-evaluation PT/OT/SLP Co-Evaluation/Treatment: Yes Reason for Co-Treatment: Complexity of the patient's impairments (multi-system involvement);For patient/therapist safety;To address functional/ADL transfers           AM-PAC PT "6 Clicks" Mobility  Outcome Measure Help needed turning from your back to your side while in a flat bed without using bedrails?: A Little Help needed moving from lying on your back to sitting on the side of a flat bed without using bedrails?: A Lot Help needed moving to and from a bed  to a chair (including a wheelchair)?: Total Help needed standing up from a chair using your arms (e.g., wheelchair or bedside chair)?: Total Help needed to walk in hospital room?: Total Help needed climbing 3-5 steps with a railing? : Total 6 Click Score: 9    End of Session   Activity Tolerance: Patient tolerated treatment well Patient left: in chair;with call bell/phone within reach Nurse Communication: Mobility status PT Visit Diagnosis: Other abnormalities of gait and mobility (R26.89);Muscle weakness (generalized) (M62.81)    Time: 5852-7782 PT Time Calculation (min) (ACUTE ONLY): 30 min   Charges:   PT Evaluation $PT Eval Moderate Complexity: 1 Mod            Wyona Almas, PT, DPT Acute Rehabilitation Services Pager [phone] Office [phone]   Deno Etienne 09/13/2019, 11:08 AM

## 2019-09-14 LAB — GLUCOSE, CAPILLARY
Glucose-Capillary: 127 mg/dL — ABNORMAL HIGH (ref 70–99)
Glucose-Capillary: 66 mg/dL — ABNORMAL LOW (ref 70–99)
Glucose-Capillary: 95 mg/dL (ref 70–99)
Glucose-Capillary: 168 mg/dL — ABNORMAL HIGH (ref 70–99)
Glucose-Capillary: 242 mg/dL — ABNORMAL HIGH (ref 70–99)

## 2019-09-14 LAB — BPAM RBC
Blood Product Expiration Date: 202108052359
ISSUE DATE / TIME: 202107031313
Unit Type and Rh: 5100

## 2019-09-14 LAB — TYPE AND SCREEN
ABO/RH(D): O POS
Antibody Screen: NEGATIVE
Unit division: 0

## 2019-09-14 LAB — CBC
HCT: 26.9 % — ABNORMAL LOW (ref 36.0–46.0)
Hemoglobin: 8.5 g/dL — ABNORMAL LOW (ref 12.0–15.0)
MCH: 29.9 pg (ref 26.0–34.0)
MCHC: 31.6 g/dL (ref 30.0–36.0)
MCV: 94.7 fL (ref 80.0–100.0)
Platelets: 356 10*3/uL (ref 150–400)
RBC: 2.84 MIL/uL — ABNORMAL LOW (ref 3.87–5.11)
RDW: 15 % (ref 11.5–15.5)
WBC: 12.2 10*3/uL — ABNORMAL HIGH (ref 4.0–10.5)
nRBC: 0 % (ref 0.0–0.2)

## 2019-09-14 LAB — BASIC METABOLIC PANEL
Anion gap: 8 (ref 5–15)
BUN: 27 mg/dL — ABNORMAL HIGH (ref 6–20)
CO2: 22 mmol/L (ref 22–32)
Calcium: 8.1 mg/dL — ABNORMAL LOW (ref 8.9–10.3)
Chloride: 105 mmol/L (ref 98–111)
Creatinine, Ser: 1.54 mg/dL — ABNORMAL HIGH (ref 0.44–1.00)
GFR calc Af Amer: 43 mL/min — ABNORMAL LOW (ref >60)
GFR calc non Af Amer: 37 mL/min — ABNORMAL LOW (ref >60)
Glucose, Bld: 170 mg/dL — ABNORMAL HIGH (ref 70–99)
Potassium: 4 mmol/L (ref 3.5–5.1)
Sodium: 135 mmol/L (ref 135–145)

## 2019-09-14 LAB — PROCALCITONIN: Procalcitonin: 0.33 ng/mL

## 2019-09-14 MED ORDER — DEXTROSE IN LACTATED RINGERS 5 % IV SOLN: INTRAVENOUS | Status: DC

## 2019-09-14 MED ORDER — SODIUM CHLORIDE 0.9 % IV BOLUS
500.0000 mL | Freq: Once | INTRAVENOUS | Status: AC
  Administered 2019-09-14: 500 mL via INTRAVENOUS

## 2019-09-14 MED ORDER — SODIUM CHLORIDE 0.9 % IV SOLN
510.0000 mg | Freq: Once | INTRAVENOUS | Status: AC
  Administered 2019-09-14: 510 mg via INTRAVENOUS
  Filled 2019-09-14: qty 17

## 2019-09-14 NOTE — Plan of Care (Signed)
  Problem: Nutrition: Goal: Adequate nutrition will be maintained Outcome: Progressing   Problem: Pain Managment: Goal: General experience of comfort will improve Outcome: Progressing   Problem: Safety: Goal: Ability to remain free from injury will improve Outcome: Progressing   
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
## 2019-09-14 NOTE — Progress Notes (Addendum)
PROGRESS NOTE    Gina [name]  PNT:[id number] DOB: [birth year] DOA: 09/20/2019 PCP: [name], MD   Brief Narrative: 59 year old with past medical history significant for perforated prepyloric ulcer in 2020 status post exploratory laparotomy with [name] patch, chronic TPN via right arm PICC line, insulin-dependent diabetes, history off bilateral lower quadrant drains along with enterocutaneous fistula with colostomy bag, chronic sacral pressure ulcer stage II, schizophrenia, bilateral BKA who presents for evaluation of fever.  She has been admitted a few times this year for sepsis including MRSA bacteremia, fungemia, Candida peritonitis in April, Proteus UTI, Candida glabrata who completed Eraxis 6/26.  Presents with persistent fevers and low hemoglobin. Patient report that she was sent to the ED due to low hemoglobin.  Patient was sent from PCP office due to hypotension systolic blood pressure in the 60s, patient had an episode of nausea and vomiting.  In the ED her hemoglobin was stable around 7.7, hyponatremia, hyperglycemia.  Fecal occult was noted to be positive.  SIRS coronavirus 2 -.    Assessment & Plan:   Principal Problem:   Acute GI bleeding Active Problems:   Hyperglycemia   HTN (hypertension)   Diabetes (HCC)   Severe protein-calorie malnutrition (HCC)   Anemia of chronic disease   Sacral decubitus ulcer   Perforated gastric ulcer (HCC)   S/P BKA (below knee amputation) bilateral (HCC)   Enterocutaneous fistula   Iron deficiency anemia   Hypoalbuminemia   Anemia   Fever; unclear source: -Repeated blood cultures, UA, urine culture. -ID consulted. Patient was started  Eraxis, received 2 days. Blood culture no growth , Eraxis stopped. Monitor for fever.  -Chest x-ray: No acute abnormality -CT Abdomen and pelvis:Similar extent but increased fullness of the chronic intra-abdominal collection associated with the known enterocutaneous fistula. 1 cm low-density in the  subcapsular central liver that is new from March 2021 and very similar to June 2021 CT, would expect more evolution if this were an  abscess.  -Appreciate sx evaluation, no further recommendations.   Anemia: Iron deficiency  May be chronic low GI bleed. Hemoglobin is stable 7.7.  2 weeks ago was 8.5 Continue with IV Protonix. Discussed with Dr. Kinnie Feil, [facility] no further evaluation unless patient develop active GI bleed. She will need IV iron, when infection is controlled.  Hb stable at 8.5 Will give IV iron. Now that she is afebrile and blood culture are negative.   History of candida glabrata fungemia;  Completed Eraxis. 6-26  Hypotension; BP normal range. Monitor. Continue with IV fluids.   Diabetes type 2: Continue with a sliding scale insulin  Hypertension: Hold amlodipine.  Continue with Lopressor.  Sacral decubitus ulcer stage  IV sacrum present on admission.  Left LE stage II : Continue with wound care.   History of perforated gastric ulcer status post Grahan patch; enterocutaneous Fistula;  Continue with wound care.  Resume TPN.   Chronic Pain due to bilateral BKA: Continue with gabapentin and oxycodone.  Hyponatremia; Continue with IV fluids. Improved.  Leukocytosis; follow trend.   AKI; will give IV bolus.   Hypoglycemia; change fluids to D 5.   Pressure Injury 05/22/19 Coccyx Bilateral Stage 4 - Full thickness tissue loss with exposed bone, tendon or muscle. Per wound nurse, healing stage IV from prior admission (Active)  05/22/19 2104  Location: Coccyx  Location Orientation: Bilateral  Staging: Stage 4 - Full thickness tissue loss with exposed bone, tendon or muscle.  Wound Description (Comments): Per wound nurse, healing stage  IV from prior admission  Present on Admission: Yes     Pressure Injury 05/22/19 Knee Right;Distal Unstageable - Full thickness tissue loss in which the base of the injury is covered by slough (yellow, tan, gray, green or brown)  and/or eschar (tan, brown or black) in the wound bed. Stump (Active)  05/22/19 2106  Location: Knee  Location Orientation: Right;Distal  Staging: Unstageable - Full thickness tissue loss in which the base of the injury is covered by slough (yellow, tan, gray, green or brown) and/or eschar (tan, brown or black) in the wound bed.  Wound Description (Comments): Stump  Present on Admission: Yes     Pressure Injury 05/22/19 Knee Left;Distal Unstageable - Full thickness tissue loss in which the base of the injury is covered by slough (yellow, tan, gray, green or brown) and/or eschar (tan, brown or black) in the wound bed. Stump (Active)  05/22/19 2108  Location: Knee  Location Orientation: Left;Distal  Staging: Unstageable - Full thickness tissue loss in which the base of the injury is covered by slough (yellow, tan, gray, green or brown) and/or eschar (tan, brown or black) in the wound bed.  Wound Description (Comments): Stump  Present on Admission: Yes     Pressure Injury 05/22/19 Thigh Left;Mid Stage 2 -  Partial thickness loss of dermis presenting as a shallow open injury with a red, pink wound bed without slough. (Active)  05/22/19 2109  Location: Thigh  Location Orientation: Left;Mid  Staging: Stage 2 -  Partial thickness loss of dermis presenting as a shallow open injury with a red, pink wound bed without slough.  Wound Description (Comments):   Present on Admission: Yes     Pressure Injury 07/22/19 Sacrum Mid Stage 3 -  Full thickness tissue loss. Subcutaneous fat may be visible but bone, tendon or muscle are NOT exposed. (Active)  07/22/19   Location: Sacrum  Location Orientation: Mid  Staging: Stage 3 -  Full thickness tissue loss. Subcutaneous fat may be visible but bone, tendon or muscle are NOT exposed.  Wound Description (Comments):   Present on Admission: Yes     Pressure Injury 07/22/19 Leg Left Stage 3 -  Full thickness tissue loss. Subcutaneous fat may be visible but bone,  tendon or muscle are NOT exposed. (Active)  07/22/19   Location: Leg  Location Orientation: Left  Staging: Stage 3 -  Full thickness tissue loss. Subcutaneous fat may be visible but bone, tendon or muscle are NOT exposed.  Wound Description (Comments):   Present on Admission: Yes     Pressure Injury 07/22/19 Leg Right Stage 3 -  Full thickness tissue loss. Subcutaneous fat may be visible but bone, tendon or muscle are NOT exposed. (Active)  07/22/19   Location: Leg  Location Orientation: Right  Staging: Stage 3 -  Full thickness tissue loss. Subcutaneous fat may be visible but bone, tendon or muscle are NOT exposed.  Wound Description (Comments):   Present on Admission: Yes     Nutrition Problem: Moderate Malnutrition Etiology: chronic illness (EC fistula, perforated prepyloric ulcer on TPN)    Signs/Symptoms: percent weight loss, moderate fat depletion, moderate muscle depletion Percent weight loss: 9 % (in 1 mo)    Interventions: Refer to RD note for recommendations  Estimated body mass index is 18.29 kg/m as calculated from the following:   Height as of this encounter: 5\' 2"  (1.575 m).   Weight as of this encounter: 45.4 kg.   DVT prophylaxis: SCD due to concern for  low hb Code Status: Full code, will need to discussed code status with patient.  Family Communication: care discussed with patient Disposition Plan:  Status is: inpatient.   Dispo: The patient is from: Home              Anticipated d/c is to: Home              Anticipated d/c date is: 3 days              Patient currently is not medically stable to d/c. Undergoing evaluation for fever, awaiting blood culture results.         Consultants:   ID  Surgery   Procedures:   none  Antimicrobials:  Eraxis  Subjective: She is sleepy. She relates I want to sleep today , four of July.  I am cold, cover me.   Objective: Vitals:   09/13/19 1345 09/13/19 1438 09/13/19 2139 09/14/19 0604  BP:  135/72 (!) 147/85 (!) 162/85 103/62  Pulse: 72 71 90 71  Resp: 16 16 16 14   Temp: 98.2 F (36.8 C) 97.7 F (36.5 C) (!) 100.4 F (38 C) 98.6 F (37 C)  TempSrc: Oral Axillary Oral Oral  SpO2: 100% 100% 95% 91%  Weight:      Height:        Intake/Output Summary (Last 24 hours) at 09/14/2019 1447 Last data filed at 09/14/2019 5621 Gross per 24 hour  Intake 959.63 ml  Output 975 ml  Net -15.37 ml   Filed Weights   09/16/2019 1521  Weight: 45.4 kg    Examination:  General exam: NAD Respiratory system: CTA Cardiovascular system: S 1, S 2 RRR Gastrointestinal system: BS present, soft, ententerocutaneous fistula with ostomy bag in place.  Central nervous system: sleepy Extremities: B/L BKA  Data Reviewed: I have personally reviewed following labs and imaging studies  CBC: Recent Labs  Lab 09/28/2019 1531 09/12/19 0903 09/13/19 1047 09/13/19 1822 09/14/19 0416  WBC 9.1 9.9 7.2  --  12.2*  HGB 7.7* 8.0* 6.6* 8.6* 8.5*  HCT 24.6* 25.9* 21.5* 27.4* 26.9*  MCV 91.4 93.8 93.9  --  94.7  PLT 365 363 344  --  308   Basic Metabolic Panel: Recent Labs  Lab 10/01/2019 1531 09/12/19 0443 09/13/19 1047 09/14/19 0416  NA 131* 131* 137 135  K 4.1 4.0 3.8 4.0  CL 96* 96* 105 105  CO2 24 27 24 22   GLUCOSE 203* 106* 103* 170*  BUN 34* 32* 29* 27*  CREATININE 0.90 1.01* 1.26* 1.54*  CALCIUM 8.4* 8.3* 8.1* 8.1*  MG  --  1.9  --   --   PHOS  --  3.7  --   --    GFR: Estimated Creatinine Clearance: 28.5 mL/min (A) (by C-G formula based on SCr of 1.54 mg/dL (H)). Liver Function Tests: Recent Labs  Lab 09/29/2019 1531 09/12/19 0443  AST 14* 13*  ALT 10 9  ALKPHOS 83 73  BILITOT 0.5 0.6  PROT 7.5 6.9  ALBUMIN 1.9* 1.7*   No results for input(s): LIPASE, AMYLASE in the last 168 hours. No results for input(s): AMMONIA in the last 168 hours. Coagulation Profile: Recent Labs  Lab 09/12/19 0443  INR 1.2   Cardiac Enzymes: No results for input(s): CKTOTAL, CKMB, CKMBINDEX,  TROPONINI in the last 168 hours. BNP (last 3 results) No results for input(s): PROBNP in the last 8760 hours. HbA1C: No results for input(s): HGBA1C in the last 72 hours. CBG: Recent Labs  Lab 09/13/19 1723 09/13/19 2134 09/14/19 0759 09/14/19 1214 09/14/19 1337  GLUCAP 104* 118* 127* 66* 95   Lipid Profile: No results for input(s): CHOL, HDL, LDLCALC, TRIG, CHOLHDL, LDLDIRECT in the last 72 hours. Thyroid Function Tests: No results for input(s): TSH, T4TOTAL, FREET4, T3FREE, THYROIDAB in the last 72 hours. Anemia Panel: Recent Labs    09/12/19 0903  TIBC 105*  IRON 16*   Sepsis Labs: Recent Labs  Lab 09/12/19 0903 09/13/19 0752 09/14/19 0416  PROCALCITON 0.25 0.20 0.33    Recent Results (from the past 240 hour(s))  SARS Coronavirus 2 by RT PCR (hospital order, performed in [facility] hospital lab) Nasopharyngeal Nasopharyngeal Swab     Status: None   Collection Time: 10/08/2019 10:06 PM   Specimen: Nasopharyngeal Swab  Result Value Ref Range Status   SARS Coronavirus 2 NEGATIVE NEGATIVE Final    Comment: (NOTE) SARS-CoV-2 target nucleic acids are NOT DETECTED.  The SARS-CoV-2 RNA is generally detectable in upper and lower respiratory specimens during the acute phase of infection. The lowest concentration of SARS-CoV-2 viral copies this assay can detect is 250 copies / mL. A negative result does not preclude SARS-CoV-2 infection and should not be used as the sole basis for treatment or other patient management decisions.  A negative result may occur with improper specimen collection / handling, submission of specimen other than nasopharyngeal swab, presence of viral mutation(s) within the areas targeted by this assay, and inadequate number of viral copies (<250 copies / mL). A negative result must be combined with clinical observations, patient history, and epidemiological information.  Fact Sheet for Patients:    StrictlyIdeas.no  Fact Sheet for Healthcare Providers: BankingDealers.co.za  This test is not yet approved or  cleared by the [place] FDA and has been authorized for detection and/or diagnosis of SARS-CoV-2 by FDA under an Emergency Use Authorization (EUA).  This EUA will remain in effect (meaning this test can be used) for the duration of the COVID-19 declaration under Section 564(b)(1) of the Act, 21 U.S.C. section 360bbb-3(b)(1), unless the authorization is terminated or revoked sooner.  Performed at [facility], [place] [address], [place], [place] [address]   Culture, blood (Routine X 2) w Reflex to ID Panel     Status: None (Preliminary result)   Collection Time: 09/12/19  4:05 AM   Specimen: BLOOD  Result Value Ref Range Status   Specimen Description BLOOD PICC LINE  Final   Special Requests   Final    BOTTLES DRAWN AEROBIC AND ANAEROBIC Blood Culture results may not be optimal due to an excessive volume of blood received in culture bottles   Culture   Final    NO GROWTH 2 DAYS Performed at [facility], [place] [address] [place], [place] [address]    Report Status PENDING  Incomplete  Urine Culture     Status: Abnormal   Collection Time: 09/12/19  8:36 AM   Specimen: Urine, Random  Result Value Ref Range Status   Specimen Description URINE, RANDOM  Final   Special Requests   Final    NONE Performed at [facility], [place] [address] [place], [place] [address]    Culture MULTIPLE SPECIES PRESENT, SUGGEST RECOLLECTION (A)  Final   Report Status 09/13/2019 FINAL  Final  Culture, blood (Routine X 2) w Reflex to ID Panel     Status: None (Preliminary result)   Collection Time: 09/12/19 12:14 PM   Specimen: BLOOD  Result  Value Ref Range Status   Specimen Description BLOOD LEFT ANTECUBITAL  Final   Special Requests   Final    BOTTLES DRAWN AEROBIC AND ANAEROBIC Blood Culture results may not be optimal due  to an inadequate volume of blood received in culture bottles   Culture   Final    NO GROWTH 2 DAYS Performed at [facility] [address], [place], [place] [address]    Report Status PENDING  Incomplete         Radiology Studies: No results found.      Scheduled Meds: . Chlorhexidine Gluconate Cloth  6 each Topical Daily  . cholestyramine  4 g Oral BID  . collagenase   Topical Daily  . cyanocobalamin  1,000 mcg Intramuscular Once  . feeding supplement  1 Container Oral TID BM  . gabapentin  300 mg Oral BID  . insulin aspart  0-15 Units Subcutaneous TID WC  . insulin aspart  0-5 Units Subcutaneous QHS  . metoprolol tartrate  50 mg Oral BID  . pantoprazole  40 mg Intravenous Q12H  . sodium chloride flush  10-40 mL Intracatheter Q12H   Continuous Infusions: . anidulafungin    . dextrose 5% lactated ringers       LOS: 2 days    Time spent: 35 minutes.     Elmarie Shiley, MD [facility]   If 7PM-7AM, please contact night-coverage [email or web address]  09/14/2019, 2:47 PM

## 2019-09-14 NOTE — Progress Notes (Signed)
Hypoglycemic Event  CBG: 66  Treatment: Peach Boost Drink   Symptoms: Pt lethargic  Follow-up CBG: Time: 1337 CBG Result: 95  Possible Reasons for Event: Pt declined morning medications and to not eat breakfast or lunch today.  Comments/MD notified:[name], Md was notified and made aware. See MAR for new orders. Will continue to monitor pt.    [name] [name]

## 2019-09-14 NOTE — Progress Notes (Signed)
Subjective: I want to sleep   Antibiotics:  Anti-infectives (From admission, onward)   Start     Dose/Rate Route Frequency Ordered Stop   09/13/19 1030  anidulafungin (ERAXIS) 100 mg in sodium chloride 0.9 % 100 mL IVPB     Discontinue     100 mg 78 mL/hr over 100 Minutes Intravenous Every 24 hours 09/12/19 0952     09/12/19 1030  anidulafungin (ERAXIS) 200 mg in sodium chloride 0.9 % 200 mL IVPB     Discontinue     200 mg 78 mL/hr over 200 Minutes Intravenous  Once 09/12/19 5697        Medications: Scheduled Meds: . Chlorhexidine Gluconate Cloth  6 each Topical Daily  . cholestyramine  4 g Oral BID  . collagenase   Topical Daily  . cyanocobalamin  1,000 mcg Intramuscular Once  . feeding supplement  1 Container Oral TID BM  . gabapentin  300 mg Oral BID  . insulin aspart  0-15 Units Subcutaneous TID WC  . insulin aspart  0-5 Units Subcutaneous QHS  . metoprolol tartrate  50 mg Oral BID  . pantoprazole  40 mg Intravenous Q12H  . sodium chloride flush  10-40 mL Intracatheter Q12H   Continuous Infusions: . sodium chloride 100 mL/hr at 09/13/19 0659  . anidulafungin 100 mg (09/14/19 1119)  . anidulafungin     PRN Meds:.acetaminophen, ondansetron (ZOFRAN) IV, oxyCODONE, sodium chloride flush    Objective: Weight change:   Intake/Output Summary (Last 24 hours) at 09/14/2019 1227 Last data filed at 09/14/2019 0605 Gross per 24 hour  Intake 1196.63 ml  Output 975 ml  Net 221.63 ml   Blood pressure 103/62, pulse 71, temperature 98.6 F (37 C), temperature source Oral, resp. rate 14, height 5\' 2"  (1.575 m), weight 45.4 kg, last menstrual period 03/29/2015, SpO2 91 %. Temp:  [97.7 F (36.5 C)-100.4 F (38 C)] 98.6 F (37 C) (07/04 0604) Pulse Rate:  [71-90] 71 (07/04 0604) Resp:  [14-16] 14 (07/04 0604) BP: (103-162)/(62-85) 103/62 (07/04 0604) SpO2:  [91 %-100 %] 91 % (07/04 0604)  Physical Exam: General: Alert and awake, oriented x3, not in any acute  distress. HEENT: anicteric sclera, EOMI CVS regular rate, normal  Chest: , no wheezing, no respiratory distress Abdomen: soft, ostomy in place Extremities: did not examine BKA site Lower back decubitus ulcer with fecal soiling 09/13/2019:    Neuro: nonfocal  CBC:    BMET Recent Labs    09/13/19 1047 09/14/19 0416  NA 137 135  K 3.8 4.0  CL 105 105  CO2 24 22  GLUCOSE 103* 170*  BUN 29* 27*  CREATININE 1.26* 1.54*  CALCIUM 8.1* 8.1*     Liver Panel  Recent Labs    10/11/2019 1531 09/12/19 0443  PROT 7.5 6.9  ALBUMIN 1.9* 1.7*  AST 14* 13*  ALT 10 9  ALKPHOS 83 73  BILITOT 0.5 0.6       Sedimentation Rate No results for input(s): ESRSEDRATE in the last 72 hours. C-Reactive Protein No results for input(s): CRP in the last 72 hours.  Micro Results: Recent Results (from the past 720 hour(s))  SARS Coronavirus 2 by RT PCR (hospital order, performed in [facility] hospital lab) Nasopharyngeal Nasopharyngeal Swab     Status: None   Collection Time: 08/17/19  8:55 AM   Specimen: Nasopharyngeal Swab  Result Value Ref Range Status   SARS Coronavirus 2 NEGATIVE NEGATIVE Final  Comment: (NOTE) SARS-CoV-2 target nucleic acids are NOT DETECTED. The SARS-CoV-2 RNA is generally detectable in upper and lower respiratory specimens during the acute phase of infection. The lowest concentration of SARS-CoV-2 viral copies this assay can detect is 250 copies / mL. A negative result does not preclude SARS-CoV-2 infection and should not be used as the sole basis for treatment or other patient management decisions.  A negative result may occur with improper specimen collection / handling, submission of specimen other than nasopharyngeal swab, presence of viral mutation(s) within the areas targeted by this assay, and inadequate number of viral copies (<250 copies / mL). A negative result must be combined with clinical observations, patient history, and epidemiological  information. Fact Sheet for Patients:   StrictlyIdeas.no Fact Sheet for Healthcare Providers: BankingDealers.co.za This test is not yet approved or cleared  by the [place] FDA and has been authorized for detection and/or diagnosis of SARS-CoV-2 by FDA under an Emergency Use Authorization (EUA).  This EUA will remain in effect (meaning this test can be used) for the duration of the COVID-19 declaration under Section 564(b)(1) of the Act, 21 U.S.C. section 360bbb-3(b)(1), unless the authorization is terminated or revoked sooner. Performed at [facility], [place] [address], [place], [place] [address]   Urine culture     Status: Abnormal   Collection Time: 08/17/19  9:05 AM   Specimen: In/Out Cath Urine  Result Value Ref Range Status   Specimen Description IN/OUT CATH URINE  Final   Special Requests   Final    NONE Performed at [facility], [place] [address], [place], [place] [address]    Culture >=100,000 COLONIES/mL PROTEUS MIRABILIS (A)  Final   Report Status 08/20/2019 FINAL  Final   Organism ID, Bacteria PROTEUS MIRABILIS (A)  Final      Susceptibility   Proteus mirabilis - MIC*    AMPICILLIN <=2 SENSITIVE Sensitive     CEFAZOLIN <=4 SENSITIVE Sensitive     CEFTRIAXONE <=1 SENSITIVE Sensitive     CIPROFLOXACIN >=4 RESISTANT Resistant     GENTAMICIN <=1 SENSITIVE Sensitive     IMIPENEM 2 SENSITIVE Sensitive     NITROFURANTOIN 128 RESISTANT Resistant     TRIMETH/SULFA >=320 RESISTANT Resistant     AMPICILLIN/SULBACTAM <=2 SENSITIVE Sensitive     PIP/TAZO <=4 SENSITIVE Sensitive     * >=100,000 COLONIES/mL PROTEUS MIRABILIS  Blood Culture (routine x 2)     Status: Abnormal   Collection Time: 08/17/19  9:50 AM   Specimen: BLOOD LEFT ARM  Result Value Ref Range Status   Specimen Description BLOOD LEFT ARM  Final   Special Requests   Final    BOTTLES DRAWN AEROBIC AND ANAEROBIC Blood Culture adequate volume    Culture  Setup Time   Final    BUDDING YEAST SEEN IN BOTH AEROBIC AND ANAEROBIC BOTTLES CRITICAL RESULT CALLED TO, READ BACK BY AND VERIFIED WITH: Park Breed [id number] FCP Performed at [facility], [address], [place], [place] [address]    Culture CANDIDA GLABRATA (A)  Final   Report Status 08/21/2019 FINAL  Final  Blood Culture ID Panel (Reflexed)     Status: Abnormal   Collection Time: 08/17/19  9:50 AM  Result Value Ref Range Status   Enterococcus species NOT DETECTED NOT DETECTED Final   Listeria monocytogenes NOT DETECTED NOT DETECTED Final   Staphylococcus species NOT DETECTED NOT DETECTED Final   Staphylococcus aureus (BCID) NOT DETECTED NOT DETECTED  Final   Streptococcus species NOT DETECTED NOT DETECTED Final   Streptococcus agalactiae NOT DETECTED NOT DETECTED Final   Streptococcus pneumoniae NOT DETECTED NOT DETECTED Final   Streptococcus pyogenes NOT DETECTED NOT DETECTED Final   Acinetobacter baumannii NOT DETECTED NOT DETECTED Final   Enterobacteriaceae species NOT DETECTED NOT DETECTED Final   Enterobacter cloacae complex NOT DETECTED NOT DETECTED Final   Escherichia coli NOT DETECTED NOT DETECTED Final   Klebsiella oxytoca NOT DETECTED NOT DETECTED Final   Klebsiella pneumoniae NOT DETECTED NOT DETECTED Final   Proteus species NOT DETECTED NOT DETECTED Final   Serratia marcescens NOT DETECTED NOT DETECTED Final   Haemophilus influenzae NOT DETECTED NOT DETECTED Final   Neisseria meningitidis NOT DETECTED NOT DETECTED Final   Pseudomonas aeruginosa NOT DETECTED NOT DETECTED Final   Candida albicans NOT DETECTED NOT DETECTED Final   Candida glabrata DETECTED (A) NOT DETECTED Final    Comment: CRITICAL RESULT CALLED TO, READ BACK BY AND VERIFIED WITH: PHARMD JEREMY F. [id number] FCP    Candida krusei NOT DETECTED NOT DETECTED Final   Candida parapsilosis NOT DETECTED NOT DETECTED Final   Candida tropicalis NOT DETECTED NOT DETECTED Final     Comment: Performed at [facility], [address], [place], [place] [address]  Blood Culture (routine x 2)     Status: Abnormal   Collection Time: 08/17/19  1:12 PM   Specimen: BLOOD LEFT ARM  Result Value Ref Range Status   Specimen Description BLOOD LEFT ARM  Final   Special Requests   Final    BOTTLES DRAWN AEROBIC ONLY Blood Culture adequate volume   Culture  Setup Time   Final    AEROBIC BOTTLE ONLY YEAST CRITICAL VALUE NOTED.  VALUE IS CONSISTENT WITH PREVIOUSLY REPORTED AND CALLED VALUE. Performed at [facility], [place] [address], [place], [place] [address]    Culture CANDIDA GLABRATA (A)  Final   Report Status 08/21/2019 FINAL  Final  MRSA PCR Screening     Status: Abnormal   Collection Time: 08/17/19  8:23 PM   Specimen: Nasal Mucosa; Nasopharyngeal  Result Value Ref Range Status   MRSA by PCR POSITIVE (A) NEGATIVE Final    Comment:        The GeneXpert MRSA Assay (FDA approved for NASAL specimens only), is one component of a comprehensive MRSA colonization surveillance program. It is not intended to diagnose MRSA infection nor to guide or monitor treatment for MRSA infections. RESULT CALLED TO, READ BACK BY AND VERIFIED WITH: J. MILLER,CHARGE RN [id number] 08/17/2019 Mena Goes Performed at [facility], [place] [address] [place], [place] [address]   Culture, blood (routine x 2)     Status: Abnormal   Collection Time: 08/19/19  5:56 AM   Specimen: BLOOD LEFT HAND  Result Value Ref Range Status   Specimen Description BLOOD LEFT HAND  Final   Special Requests   Final    BOTTLES DRAWN AEROBIC AND ANAEROBIC Blood Culture adequate volume   Culture  Setup Time   Final    ANAEROBIC BOTTLE ONLY YEAST CRITICAL VALUE NOTED.  VALUE IS CONSISTENT WITH PREVIOUSLY REPORTED AND CALLED VALUE. Performed at [facility], [place] [address] [place], [place] [address]    Culture CANDIDA GLABRATA (A)  Final   Report Status 08/26/2019 FINAL  Final  Culture, blood  (routine x 2)     Status: Abnormal   Collection Time: 08/19/19  5:56 AM   Specimen: BLOOD  LEFT HAND  Result Value Ref Range Status   Specimen Description BLOOD LEFT HAND  Final   Special Requests   Final    BOTTLES DRAWN AEROBIC AND ANAEROBIC Blood Culture adequate volume   Culture  Setup Time (A)  Final    YEAST AEROBIC BOTTLE ONLY CRITICAL RESULT CALLED TO, READ BACK BY AND VERIFIED WITH: PHARMD Y PATEL [id number] AT 33 AM BY CM Performed at [facility], [place] [address] [place], [place] [address]    Culture CANDIDA GLABRATA (A)  Final   Report Status 08/24/2019 FINAL  Final  Culture, blood (routine x 2)     Status: None   Collection Time: 08/23/19 11:17 AM   Specimen: BLOOD LEFT FOREARM  Result Value Ref Range Status   Specimen Description BLOOD LEFT FOREARM  Final   Special Requests   Final    BOTTLES DRAWN AEROBIC ONLY Blood Culture adequate volume   Culture   Final    NO GROWTH 5 DAYS Performed at [facility], [place] [address] [place], [place] [address]    Report Status 08/28/2019 FINAL  Final  Culture, blood (routine x 2)     Status: None   Collection Time: 08/23/19 11:22 AM   Specimen: BLOOD LEFT FOREARM  Result Value Ref Range Status   Specimen Description BLOOD LEFT FOREARM  Final   Special Requests   Final    BOTTLES DRAWN AEROBIC ONLY Blood Culture adequate volume   Culture   Final    NO GROWTH 5 DAYS Performed at [facility], [place] [address], [place], [place] [address]    Report Status 08/28/2019 FINAL  Final  SARS Coronavirus 2 by RT PCR (hospital order, performed in [facility] hospital lab) Nasopharyngeal Nasopharyngeal Swab     Status: None   Collection Time: 09/29/2019 10:06 PM   Specimen: Nasopharyngeal Swab  Result Value Ref Range Status   SARS Coronavirus 2 NEGATIVE NEGATIVE Final    Comment: (NOTE) SARS-CoV-2 target nucleic acids are NOT DETECTED.  The SARS-CoV-2 RNA is generally detectable in upper and lower respiratory specimens  during the acute phase of infection. The lowest concentration of SARS-CoV-2 viral copies this assay can detect is 250 copies / mL. A negative result does not preclude SARS-CoV-2 infection and should not be used as the sole basis for treatment or other patient management decisions.  A negative result may occur with improper specimen collection / handling, submission of specimen other than nasopharyngeal swab, presence of viral mutation(s) within the areas targeted by this assay, and inadequate number of viral copies (<250 copies / mL). A negative result must be combined with clinical observations, patient history, and epidemiological information.  Fact Sheet for Patients:   StrictlyIdeas.no  Fact Sheet for Healthcare Providers: BankingDealers.co.za  This test is not yet approved or  cleared by the [place] FDA and has been authorized for detection and/or diagnosis of SARS-CoV-2 by FDA under an Emergency Use Authorization (EUA).  This EUA will remain in effect (meaning this test can be used) for the duration of the COVID-19 declaration under Section 564(b)(1) of the Act, 21 U.S.C. section 360bbb-3(b)(1), unless the authorization is terminated or revoked sooner.  Performed at [facility], [place] [address], [place], [place] [address]   Culture, blood (Routine X 2) w Reflex to ID Panel     Status: None (Preliminary result)   Collection Time: 09/12/19  4:05 AM   Specimen: BLOOD  Result Value Ref Range  Status   Specimen Description BLOOD PICC LINE  Final   Special Requests   Final    BOTTLES DRAWN AEROBIC AND ANAEROBIC Blood Culture results may not be optimal due to an excessive volume of blood received in culture bottles   Culture   Final    NO GROWTH 1 DAY Performed at [facility], [place] [address], [place], [place] [address]    Report Status PENDING  Incomplete  Urine Culture     Status: Abnormal   Collection Time:  09/12/19  8:36 AM   Specimen: Urine, Random  Result Value Ref Range Status   Specimen Description URINE, RANDOM  Final   Special Requests   Final    NONE Performed at [facility], [place] [address] [place], [place] [address]    Culture MULTIPLE SPECIES PRESENT, SUGGEST RECOLLECTION (A)  Final   Report Status 09/13/2019 FINAL  Final  Culture, blood (Routine X 2) w Reflex to ID Panel     Status: None (Preliminary result)   Collection Time: 09/12/19 12:14 PM   Specimen: BLOOD  Result Value Ref Range Status   Specimen Description BLOOD LEFT ANTECUBITAL  Final   Special Requests   Final    BOTTLES DRAWN AEROBIC AND ANAEROBIC Blood Culture results may not be optimal due to an inadequate volume of blood received in culture bottles   Culture   Final    NO GROWTH 1 DAY Performed at [facility], [place] [address], [place],  [address]    Report Status PENDING  Incomplete    Studies/Results: No results found.    Assessment/Plan:  INTERVAL HISTORY: no new events   Principal Problem:   Acute GI bleeding Active Problems:   Hyperglycemia   HTN (hypertension)   Diabetes (HCC)   Severe protein-calorie malnutrition (HCC)   Anemia of chronic disease   Sacral decubitus ulcer   Perforated gastric ulcer (HCC)   S/P BKA (below knee amputation) bilateral (HCC)   Enterocutaneous fistula   Iron deficiency anemia   Hypoalbuminemia   Anemia    Gina [name] is a 59 y.o. female with 59 y.o. female with diabetes and schizophrenia who suffered a gastric perforation last November. She underwent patch repair of the perforation. She had diffuse peritonitis and Candida glabrata fungemia. 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 She has undergone colostomy. She has had a PICC for TPN ever since. She was hospitalized again in March with MRSA bacteremia. She was treated for a Proteus UTI in  April. She was admitted again on 08/17/2019 with candidemia secondary to Candida glabrata.  Repeat blood cultures were positive on 08/19/2019 but became negative by 08/23/2019.  She was discharged on caspofungin after her PICC was replaced and completed therapy on 09/09/2019. She started having recurrent fever toward the end of her caspofungin therapy.   CT scan shows persistent intrabdominal fluid collection present in connection with EC fistula  She has subcapsular fluid that radiology do not think is an abscess  He has a stage IV decubitus ulcer that had some fecal contamination but does not appear overtly infected.  Her bilateral below the knee amputation do not also appear infected I examined the wounds on the right today and they were not purulent  Her blood cultures remain negative.  I want to stop the Eraxis for now  --if she fevers again ? IR or general surgery can get at  the fluid collection connected to her EC fistula and get culture?      LOS: 2 days   Alcide Evener 09/14/2019, 12:27 PM

## 2019-09-15 ENCOUNTER — Inpatient Hospital Stay (HOSPITAL_COMMUNITY): Payer: Medicare HMO

## 2019-09-15 DIAGNOSIS — R5383 Other fatigue: Secondary | ICD-10-CM

## 2019-09-15 DIAGNOSIS — D509 Iron deficiency anemia, unspecified: Secondary | ICD-10-CM

## 2019-09-15 DIAGNOSIS — R339 Retention of urine, unspecified: Secondary | ICD-10-CM

## 2019-09-15 DIAGNOSIS — R5381 Other malaise: Secondary | ICD-10-CM

## 2019-09-15 DIAGNOSIS — E1165 Type 2 diabetes mellitus with hyperglycemia: Secondary | ICD-10-CM

## 2019-09-15 DIAGNOSIS — R509 Fever, unspecified: Secondary | ICD-10-CM

## 2019-09-15 LAB — GLUCOSE, CAPILLARY
Glucose-Capillary: 237 mg/dL — ABNORMAL HIGH (ref 70–99)
Glucose-Capillary: 127 mg/dL — ABNORMAL HIGH (ref 70–99)
Glucose-Capillary: 79 mg/dL (ref 70–99)
Glucose-Capillary: 248 mg/dL — ABNORMAL HIGH (ref 70–99)

## 2019-09-15 LAB — DIFFERENTIAL
Abs Immature Granulocytes: 0.09 10*3/uL — ABNORMAL HIGH (ref 0.00–0.07)
Basophils Absolute: 0 10*3/uL (ref 0.0–0.1)
Basophils Relative: 0 %
Eosinophils Absolute: 0 10*3/uL (ref 0.0–0.5)
Eosinophils Relative: 0 %
Immature Granulocytes: 1 %
Lymphocytes Relative: 12 %
Lymphs Abs: 1.6 10*3/uL (ref 0.7–4.0)
Monocytes Absolute: 1.5 10*3/uL — ABNORMAL HIGH (ref 0.1–1.0)
Monocytes Relative: 11 %
Neutro Abs: 10 10*3/uL — ABNORMAL HIGH (ref 1.7–7.7)
Neutrophils Relative %: 76 %

## 2019-09-15 LAB — URINALYSIS, ROUTINE W REFLEX MICROSCOPIC
Bilirubin Urine: NEGATIVE
Glucose, UA: NEGATIVE mg/dL
Ketones, ur: NEGATIVE mg/dL
Leukocytes,Ua: NEGATIVE
Nitrite: NEGATIVE
Protein, ur: 100 mg/dL — AB
RBC / HPF: >50 RBC/hpf — ABNORMAL HIGH (ref 0–5)
Specific Gravity, Urine: 1.017 (ref 1.005–1.030)
pH: 5 (ref 5.0–8.0)

## 2019-09-15 LAB — COMPREHENSIVE METABOLIC PANEL
ALT: 9 U/L (ref 0–44)
AST: 11 U/L — ABNORMAL LOW (ref 15–41)
Albumin: 1.5 g/dL — ABNORMAL LOW (ref 3.5–5.0)
Alkaline Phosphatase: 73 U/L (ref 38–126)
Anion gap: 8 (ref 5–15)
BUN: 28 mg/dL — ABNORMAL HIGH (ref 6–20)
CO2: 19 mmol/L — ABNORMAL LOW (ref 22–32)
Calcium: 8.1 mg/dL — ABNORMAL LOW (ref 8.9–10.3)
Chloride: 110 mmol/L (ref 98–111)
Creatinine, Ser: 2.28 mg/dL — ABNORMAL HIGH (ref 0.44–1.00)
GFR calc Af Amer: 27 mL/min — ABNORMAL LOW (ref >60)
GFR calc non Af Amer: 23 mL/min — ABNORMAL LOW (ref >60)
Glucose, Bld: 262 mg/dL — ABNORMAL HIGH (ref 70–99)
Potassium: 3.6 mmol/L (ref 3.5–5.1)
Sodium: 137 mmol/L (ref 135–145)
Total Bilirubin: 0.7 mg/dL (ref 0.3–1.2)
Total Protein: 6.2 g/dL — ABNORMAL LOW (ref 6.5–8.1)

## 2019-09-15 LAB — PREALBUMIN: Prealbumin: <5 mg/dL — ABNORMAL LOW (ref 18–38)

## 2019-09-15 LAB — CBC
HCT: 23.3 % — ABNORMAL LOW (ref 36.0–46.0)
Hemoglobin: 7.4 g/dL — ABNORMAL LOW (ref 12.0–15.0)
MCH: 30.5 pg (ref 26.0–34.0)
MCHC: 31.8 g/dL (ref 30.0–36.0)
MCV: 95.9 fL (ref 80.0–100.0)
Platelets: 331 10*3/uL (ref 150–400)
RBC: 2.43 MIL/uL — ABNORMAL LOW (ref 3.87–5.11)
RDW: 15.2 % (ref 11.5–15.5)
WBC: 13.2 10*3/uL — ABNORMAL HIGH (ref 4.0–10.5)
nRBC: 0 % (ref 0.0–0.2)

## 2019-09-15 LAB — PHOSPHORUS: Phosphorus: 3.7 mg/dL (ref 2.5–4.6)

## 2019-09-15 LAB — MAGNESIUM: Magnesium: 1.5 mg/dL — ABNORMAL LOW (ref 1.7–2.4)

## 2019-09-15 LAB — TRIGLYCERIDES: Triglycerides: 126 mg/dL (ref <150)

## 2019-09-15 MED ORDER — SODIUM CHLORIDE 0.9 % IV BOLUS
500.0000 mL | Freq: Once | INTRAVENOUS | Status: AC
  Administered 2019-09-15: 500 mL via INTRAVENOUS

## 2019-09-15 MED ORDER — SODIUM CHLORIDE 0.9 % IV SOLN: INTRAVENOUS | Status: AC

## 2019-09-15 MED ORDER — NYSTATIN 100000 UNIT/ML MT SUSP
5.0000 mL | Freq: Four times a day (QID) | OROMUCOSAL | Status: DC
  Administered 2019-09-15 – 2019-10-02 (×35): 500000 [IU] via ORAL
  Filled 2019-09-15 (×35): qty 5

## 2019-09-15 MED ORDER — TRACE MINERALS CU-MN-SE-ZN 300-55-60-3000 MCG/ML IV SOLN
INTRAVENOUS | Status: AC
  Filled 2019-09-15: qty 320

## 2019-09-15 MED ORDER — MAGNESIUM SULFATE 2 GM/50ML IV SOLN
2.0000 g | Freq: Once | INTRAVENOUS | Status: AC
  Administered 2019-09-15: 2 g via INTRAVENOUS
  Filled 2019-09-15: qty 50

## 2019-09-15 NOTE — Progress Notes (Signed)
PROGRESS NOTE    Gina [name]  XNA:[id number] DOB: 07-Mar-1961 DOA: 09/23/2019 PCP: [name], MD   Brief Narrative: 59 year old with past medical history significant for perforated prepyloric ulcer in 2020 status post exploratory laparotomy with [name] patch, chronic TPN via right arm PICC line, insulin-dependent diabetes, history off bilateral lower quadrant drains along with enterocutaneous fistula with colostomy bag, chronic sacral pressure ulcer stage II, schizophrenia, bilateral BKA who presents for evaluation of fever.  She has been admitted a few times this year for sepsis including MRSA bacteremia, fungemia, Candida peritonitis in April, Proteus UTI, Candida glabrata who completed Eraxis 6/26.  Presents with persistent fevers and low hemoglobin. Patient report that she was sent to the ED due to low hemoglobin.  Patient was sent from PCP office due to hypotension systolic blood pressure in the 60s, patient had an episode of nausea and vomiting.  In the ED her hemoglobin was stable around 7.7, hyponatremia, hyperglycemia.  Fecal occult was noted to be positive.  SIRS coronavirus 2 -.    Assessment & Plan:   Principal Problem:   Acute GI bleeding Active Problems:   Hyperglycemia   HTN (hypertension)   Diabetes (HCC)   Severe protein-calorie malnutrition (HCC)   Anemia of chronic disease   Sacral decubitus ulcer   Perforated gastric ulcer (HCC)   S/P BKA (below knee amputation) bilateral (HCC)   Enterocutaneous fistula   Iron deficiency anemia   Hypoalbuminemia   Anemia   Fever; unclear source: -Repeated blood cultures, UA, urine culture. -ID consulted. Patient was started  Eraxis, received 2 days. Blood culture no growth , Eraxis stopped. Monitor for fever.  -Chest x-ray: No acute abnormality -CT Abdomen and pelvis:Similar extent but increased fullness of the chronic intra-abdominal collection associated with the known enterocutaneous fistula. 1 cm low-density in the  subcapsular central liver that is new from March 2021 and very similar to June 2021 CT, would expect more evolution if this were an  abscess. -Fevers persist, discussed with ID 7/05  they recommend surgery evaluation/IR for drainage sample intraabdominal collection.  Repeat  urine culture. Plan to check blood culture if she spikes fever.   Anemia: Iron deficiency  May be chronic low GI bleed. Hemoglobin is stable 7.7.  2 weeks ago was 8.5 Continue with IV Protonix. Discussed with Dr. Kinnie Feil, [facility] no further evaluation unless patient develop active GI bleed. She will need IV iron, when infection is controlled.  Hb stable at 8.5 Received IV iron 7/05.  History of candida glabrata fungemia;  Completed Eraxis. 6-26  Hypotension; BP normal range. Monitor. Continue with IV fluids.   Diabetes type 2: Continue with a sliding scale insulin  Hypertension: Hold amlodipine.  Continue with Lopressor.  Sacral decubitus ulcer stage  IV sacrum present on admission.  Left LE stage II : Continue with wound care.   History of perforated gastric ulcer status post Grahan patch; enterocutaneous Fistula;  Continue with wound care.  Resume TPN.   Chronic Pain due to bilateral BKA: Continue with gabapentin and oxycodone.  Hyponatremia; Continue with IV fluids. Improved.  Leukocytosis; follow trend.   AKI; Urine retention overnight.  Check renal US.  Urine culture.  UA IV bolus, IV fluids.   Oral thrush; nystatin ordered.   Hypoglycemia; change fluids to D 5.   Pressure Injury 05/22/19 Coccyx Bilateral Stage 4 - Full thickness tissue loss with exposed bone, tendon or muscle. Per wound nurse, healing stage IV from prior admission (Active)  05/22/19 2104  Location: Coccyx  Location Orientation: Bilateral  Staging: Stage 4 - Full thickness tissue loss with exposed bone, tendon or muscle.  Wound Description (Comments): Per wound nurse, healing stage IV from prior admission  Present on  Admission: Yes     Pressure Injury 05/22/19 Knee Right;Distal Unstageable - Full thickness tissue loss in which the base of the injury is covered by slough (yellow, tan, gray, green or brown) and/or eschar (tan, brown or black) in the wound bed. Stump (Active)  05/22/19 2106  Location: Knee  Location Orientation: Right;Distal  Staging: Unstageable - Full thickness tissue loss in which the base of the injury is covered by slough (yellow, tan, gray, green or brown) and/or eschar (tan, brown or black) in the wound bed.  Wound Description (Comments): Stump  Present on Admission: Yes     Pressure Injury 05/22/19 Knee Left;Distal Unstageable - Full thickness tissue loss in which the base of the injury is covered by slough (yellow, tan, gray, green or brown) and/or eschar (tan, brown or black) in the wound bed. Stump (Active)  05/22/19 2108  Location: Knee  Location Orientation: Left;Distal  Staging: Unstageable - Full thickness tissue loss in which the base of the injury is covered by slough (yellow, tan, gray, green or brown) and/or eschar (tan, brown or black) in the wound bed.  Wound Description (Comments): Stump  Present on Admission: Yes     Pressure Injury 05/22/19 Thigh Left;Mid Stage 2 -  Partial thickness loss of dermis presenting as a shallow open injury with a red, pink wound bed without slough. (Active)  05/22/19 2109  Location: Thigh  Location Orientation: Left;Mid  Staging: Stage 2 -  Partial thickness loss of dermis presenting as a shallow open injury with a red, pink wound bed without slough.  Wound Description (Comments):   Present on Admission: Yes     Pressure Injury 07/22/19 Sacrum Mid Stage 3 -  Full thickness tissue loss. Subcutaneous fat may be visible but bone, tendon or muscle are NOT exposed. (Active)  07/22/19   Location: Sacrum  Location Orientation: Mid  Staging: Stage 3 -  Full thickness tissue loss. Subcutaneous fat may be visible but bone, tendon or muscle are  NOT exposed.  Wound Description (Comments):   Present on Admission: Yes     Pressure Injury 07/22/19 Leg Left Stage 3 -  Full thickness tissue loss. Subcutaneous fat may be visible but bone, tendon or muscle are NOT exposed. (Active)  07/22/19   Location: Leg  Location Orientation: Left  Staging: Stage 3 -  Full thickness tissue loss. Subcutaneous fat may be visible but bone, tendon or muscle are NOT exposed.  Wound Description (Comments):   Present on Admission: Yes     Pressure Injury 07/22/19 Leg Right Stage 3 -  Full thickness tissue loss. Subcutaneous fat may be visible but bone, tendon or muscle are NOT exposed. (Active)  07/22/19   Location: Leg  Location Orientation: Right  Staging: Stage 3 -  Full thickness tissue loss. Subcutaneous fat may be visible but bone, tendon or muscle are NOT exposed.  Wound Description (Comments):   Present on Admission: Yes     Nutrition Problem: Moderate Malnutrition Etiology: chronic illness (EC fistula, perforated prepyloric ulcer on TPN)    Signs/Symptoms: percent weight loss, moderate fat depletion, moderate muscle depletion Percent weight loss: 9 % (in 1 mo)    Interventions: Refer to RD note for recommendations  Estimated body mass index is 18.29 kg/m as calculated from the  following:   Height as of this encounter: 5\' 2"  (1.575 m).   Weight as of this encounter: 45.4 kg.   DVT prophylaxis: SCD due to concern for low hb Code Status: Full code, will need to discussed code status with patient.  Family Communication: care discussed with patient Disposition Plan:  Status is: inpatient.   Dispo: The patient is from: Home              Anticipated d/c is to: Home              Anticipated d/c date is: 3 days              Patient currently is not medically stable to d/c. Undergoing evaluation for fever, awaiting blood culture results.         Consultants:   ID  Surgery   Procedures:   none  Antimicrobials:   Eraxis  Subjective: She is sleepy, feels tired.   Objective: Vitals:   09/14/19 1743 09/14/19 2128 09/15/19 0438 09/15/19 1343  BP: (!) 111/53 130/62 (!) 115/53 116/67  Pulse: 89 90 88 67  Resp: 17 17 17 18   Temp: (!) 100.4 F (38 C) 99 F (37.2 C) 99.5 F (37.5 C) 98.1 F (36.7 C)  TempSrc: Oral Tympanic Tympanic Oral  SpO2: 94% 95% 92% 94%  Weight:      Height:        Intake/Output Summary (Last 24 hours) at 09/15/2019 1537 Last data filed at 09/15/2019 1500 Gross per 24 hour  Intake 4514.34 ml  Output 1375 ml  Net 3139.34 ml   Filed Weights   09/26/2019 1521  Weight: 45.4 kg    Examination:  General exam: NAD Respiratory system: CTA Cardiovascular system: SS 1, S 2 RRR Gastrointestinal system: BS present, soft , nt ententerocutaneous fistula with ostomy bag in place.  Central nervous system: Sleepy Extremities: B/L BKA  Data Reviewed: I have personally reviewed following labs and imaging studies  CBC: Recent Labs  Lab 09/16/2019 1531 10/08/2019 1531 09/12/19 0903 09/13/19 1047 09/13/19 1822 09/14/19 0416 09/15/19 0420  WBC 9.1  --  9.9 7.2  --  12.2* 13.2*  NEUTROABS  --   --   --   --   --   --  10.0*  HGB 7.7*   < > 8.0* 6.6* 8.6* 8.5* 7.4*  HCT 24.6*   < > 25.9* 21.5* 27.4* 26.9* 23.3*  MCV 91.4  --  93.8 93.9  --  94.7 95.9  PLT 365  --  363 344  --  356 331   < > = values in this interval not displayed.   Basic Metabolic Panel: Recent Labs  Lab 09/28/2019 1531 09/12/19 0443 09/13/19 1047 09/14/19 0416 09/15/19 0420  NA 131* 131* 137 135 137  K 4.1 4.0 3.8 4.0 3.6  CL 96* 96* 105 105 110  CO2 24 27 24 22  19*  GLUCOSE 203* 106* 103* 170* 262*  BUN 34* 32* 29* 27* 28*  CREATININE 0.90 1.01* 1.26* 1.54* 2.28*  CALCIUM 8.4* 8.3* 8.1* 8.1* 8.1*  MG  --  1.9  --   --  1.5*  PHOS  --  3.7  --   --  3.7   GFR: Estimated Creatinine Clearance: 19.3 mL/min (A) (by C-G formula based on SCr of 2.28 mg/dL (H)). Liver Function Tests: Recent Labs   Lab 09/27/2019 1531 09/12/19 0443 09/15/19 0420  AST 14* 13* 11*  ALT 10 9 9   ALKPHOS 83 73  73  BILITOT 0.5 0.6 0.7  PROT 7.5 6.9 6.2*  ALBUMIN 1.9* 1.7* 1.5*   No results for input(s): LIPASE, AMYLASE in the last 168 hours. No results for input(s): AMMONIA in the last 168 hours. Coagulation Profile: Recent Labs  Lab 09/12/19 0443  INR 1.2   Cardiac Enzymes: No results for input(s): CKTOTAL, CKMB, CKMBINDEX, TROPONINI in the last 168 hours. BNP (last 3 results) No results for input(s): PROBNP in the last 8760 hours. HbA1C: No results for input(s): HGBA1C in the last 72 hours. CBG: Recent Labs  Lab 09/14/19 1337 09/14/19 1659 09/14/19 2131 09/15/19 0749 09/15/19 1155  GLUCAP 95 168* 242* 237* 127*   Lipid Profile: Recent Labs    09/15/19 0420  TRIG 126   Thyroid Function Tests: No results for input(s): TSH, T4TOTAL, FREET4, T3FREE, THYROIDAB in the last 72 hours. Anemia Panel: No results for input(s): VITAMINB12, FOLATE, FERRITIN, TIBC, IRON, RETICCTPCT in the last 72 hours. Sepsis Labs: Recent Labs  Lab 09/12/19 0903 09/13/19 0752 09/14/19 0416  PROCALCITON 0.25 0.20 0.33    Recent Results (from the past 240 hour(s))  SARS Coronavirus 2 by RT PCR (hospital order, performed in [facility] hospital lab) Nasopharyngeal Nasopharyngeal Swab     Status: None   Collection Time: 09/26/2019 10:06 PM   Specimen: Nasopharyngeal Swab  Result Value Ref Range Status   SARS Coronavirus 2 NEGATIVE NEGATIVE Final    Comment: (NOTE) SARS-CoV-2 target nucleic acids are NOT DETECTED.  The SARS-CoV-2 RNA is generally detectable in upper and lower respiratory specimens during the acute phase of infection. The lowest concentration of SARS-CoV-2 viral copies this assay can detect is 250 copies / mL. A negative result does not preclude SARS-CoV-2 infection and should not be used as the sole basis for treatment or other patient management decisions.  A negative result may  occur with improper specimen collection / handling, submission of specimen other than nasopharyngeal swab, presence of viral mutation(s) within the areas targeted by this assay, and inadequate number of viral copies (<250 copies / mL). A negative result must be combined with clinical observations, patient history, and epidemiological information.  Fact Sheet for Patients:   StrictlyIdeas.no  Fact Sheet for Healthcare Providers: BankingDealers.co.za  This test is not yet approved or  cleared by the [place] FDA and has been authorized for detection and/or diagnosis of SARS-CoV-2 by FDA under an Emergency Use Authorization (EUA).  This EUA will remain in effect (meaning this test can be used) for the duration of the COVID-19 declaration under Section 564(b)(1) of the Act, 21 U.S.C. section 360bbb-3(b)(1), unless the authorization is terminated or revoked sooner.  Performed at [facility], [place] [address], [place], [place] [address]   Culture, blood (Routine X 2) w Reflex to ID Panel     Status: None (Preliminary result)   Collection Time: 09/12/19  4:05 AM   Specimen: BLOOD  Result Value Ref Range Status   Specimen Description BLOOD PICC LINE  Final   Special Requests   Final    BOTTLES DRAWN AEROBIC AND ANAEROBIC Blood Culture results may not be optimal due to an excessive volume of blood received in culture bottles   Culture   Final    NO GROWTH 3 DAYS Performed at [facility], [place] [address] [place], [place] [address]    Report Status PENDING  Incomplete  Urine Culture     Status: Abnormal   Collection Time: 09/12/19  8:36 AM  Specimen: Urine, Random  Result Value Ref Range Status   Specimen Description URINE, RANDOM  Final   Special Requests   Final    NONE Performed at [facility], [address] [place], [place] [address]    Culture MULTIPLE SPECIES PRESENT, SUGGEST RECOLLECTION (A)  Final    Report Status 09/13/2019 FINAL  Final  Culture, blood (Routine X 2) w Reflex to ID Panel     Status: None (Preliminary result)   Collection Time: 09/12/19 12:14 PM   Specimen: BLOOD  Result Value Ref Range Status   Specimen Description BLOOD LEFT ANTECUBITAL  Final   Special Requests   Final    BOTTLES DRAWN AEROBIC AND ANAEROBIC Blood Culture results may not be optimal due to an inadequate volume of blood received in culture bottles   Culture   Final    NO GROWTH 3 DAYS Performed at [facility], [place] [address] [place], [place] [address]    Report Status PENDING  Incomplete         Radiology Studies: No results found.      Scheduled Meds: . Chlorhexidine Gluconate Cloth  6 each Topical Daily  . cholestyramine  4 g Oral BID  . collagenase   Topical Daily  . cyanocobalamin  1,000 mcg Intramuscular Once  . feeding supplement  1 Container Oral TID BM  . gabapentin  300 mg Oral BID  . insulin aspart  0-15 Units Subcutaneous TID WC  . insulin aspart  0-5 Units Subcutaneous QHS  . metoprolol tartrate  50 mg Oral BID  . nystatin  5 mL Oral QID  . pantoprazole  40 mg Intravenous Q12H  . sodium chloride flush  10-40 mL Intracatheter Q12H   Continuous Infusions: . sodium chloride 100 mL/hr at 09/15/19 1500  . sodium chloride    . TPN ADULT (ION)       LOS: 3 days    Time spent: 35 minutes.     Elmarie Shiley, MD [facility]   If 7PM-7AM, please contact night-coverage [email or web address]  09/15/2019, 3:37 PM

## 2019-09-15 NOTE — TOC Progression Note (Addendum)
Transition of Care [facility]) - Progression Note    Patient Details  Name: Gina [name] MRN: [id number] Date of Birth: [birth year]  [facility] CM/SW Contact  Jacalyn Lefevre Edson Snowball, RN Phone Number: 09/15/2019, 4:08 PM  Clinical Narrative:     Confirmed with Pam with [facility], patient is active for home TPN. Confirmed with Tommi Rumps with [facility] patient active with [facility] for home TPN, PICC line care and fistula care   Spoke to patient at bedside. Patient understands PT recommendation for SNF, however she wants to go home. She lives with son and daughter helps her. She has hospital bed, hoyer lift and wheel chair at home. Confirmed address. Patient unsure if she would need PTAR transport at discharge. Patient wants to continue with [facility] and [facility].    1655 Received a message from Dr Tyrell Antonio that daughter wants patient to go to SNF at discharge. Told patient. Patient responded "No way". With permission called daughter [name] [phone] from patient's room. No answer left message with my direct call back number, aslo left message asking [name] to discuss discharge plan with her mother this evening. Patient aware and will discuss with daughter this evening. NCM will follow up in morning.  Daughter called back placed on speaker phone. Daughter will visit her mother this evening and discuss SNF. Daughter has had vaccinations, for covid patient has not. Both aware patient may not be allowed visitors at [facility]  Expected Discharge Plan: [place] Barriers to Discharge: Continued Medical Work up  Expected Discharge Plan and Services Expected Discharge Plan: [place]       Living arrangements for the past 2 months: Apartment                                       Social Determinants of Health (SDOH) Interventions    Readmission Risk Interventions Readmission Risk Prevention Plan 08/29/2019 08/19/2019 02/10/2019  Post Dischage  Appt - - -  Medication Screening - - -  Transportation Screening Complete Complete Complete  PCP follow-up - - -  PCP or Specialist Appt within 3-5 Days - - Not Complete  Not Complete comments - - DC date unknown- pt established with PCP  HRI or [place] - - Complete  Social Work Consult for [place] Planning/Counseling - - Not Complete  SW consult not completed comments - - awaiting call back from daughter and [place] Screening - - Not Complete  Palliative Care Screening Not Complete Comments - - pending need  Medication Review (RN Care Manager) Complete Complete Referral to Pharmacy  PCP or Specialist appointment within 3-5 days of discharge Complete - -  HRI or Home Care Consult Complete Complete -  SW Recovery Care/Counseling Consult Complete Complete -  Palliative Care Screening Complete Not Applicable -  [place] Complete Not Applicable -  Some recent data might be hidden

## 2019-09-15 NOTE — Progress Notes (Signed)
PHARMACY - TOTAL PARENTERAL NUTRITION CONSULT NOTE  Indication:  EC fistula  Patient Measurements: Height: 5\' 2"  (157.5 cm) Weight: 45.4 kg (100 lb) IBW/kg (Calculated) : 50.1 TPN AdjBW (KG): 45.4 Body mass index is 18.29 kg/m. Usual Weight: 53-60 kg  Assessment:  49 YOF presents on 09/27/2019 with anemia.  Patient has a history of perforated prepyloric ulcer s/p [place].  She has an Chief Strategy Officer in place from January 2021 due to [facility] fistula and has been on TPN since 02/2019.  She was recently admitted with candidemia, resulting in PICC replacement.  Pharmacy consulted to continue TPN.  Patient reports not missing any TPN bags PTA. Per further discussion with MD on 7/2, held off on starting TPN initially due to fever and recent fungemia. Plan to resume 7/5. ID monitoring off antibiotics for now.  Glucose / Insulin: hx DM controlled by diet outpatient. Required insulin gtt last admit. CBGs variable off TPN, 95-262. Utilized 7 units SSI / 24 hrs Electrolytes: CO2 low 19, Mag 1.5 (Mag sulfate 2g IV x 1 already given this AM) others WNL Renal: AKI - SCr 0.9>>2.28 (BL 0.6-0.8), BUN 28 - stable; UOP 0.9 ml/kg/hr LFTs / TGs: LFTs / Tbili / TG WNL Prealbumin / albumin: Prealbumin down to <5, albumin 1.5 Intake / Output; MIVF: 175 ml stool output/24 hrs. D5LR at 75 ml/hr GI Imaging: none since admit Surgeries / Procedures: none since admit  Central access: PICC removed 6/12; replaced 6/16 TPN start date: chronic TPN  Nutritional Goals (per [facility] staff on 7/2): kCal: 1472 kCal, Protein: 90g, CHO 180g, ILE 50g, no insulin Infuse 1800 mL over 14 hrs  RD goals on 6/16: 1500-1700 kCal, 65-80g AA and >/= 1.5L fluid per day  Current Nutrition:  HH/CMD Boost TID between meals - 2 charted yesterday  Plan:  Start TPN at 40 mL/hr at 1800. Titrate to goal as appropriate Electrolytes in TPN: 42mEq/L of Na, remove K/Ca/Phos due to AKI and previous trends, 71mEq/L of Mg. Cl:Ac ratio 1:2 Add  standard MVI and trace elements to TPN Change SSI to Moderate q4h SSI and adjust as needed D/c D5LR per discussion with MD with elevated CBGs. Change to NS at 75 ml/hr. Reduce MIVF to 35 mL/hr at 1800 when TPN bag hung Monitor TPN labs on Mon/Thurs   Arturo Morton, PharmD, BCPS Please check AMION for all [place] contact numbers Clinical Pharmacist 09/15/2019 10:33 AM

## 2019-09-15 NOTE — Consult Note (Signed)
WOC Nurse Consult Note:   Patient receiving care in [place]. Reason for Consult: Chronic EC fistula.  Patient seen by [facility] Nurse L. McNichol on 7/3.  Orders for [facility] fistula care present in chart.  Thank you for the consult. [place] nurse will not follow at this time.  Please re-consult the [place] team if needed.  Val Riles, RN, MSN, CWOCN, CNS-BC, pager [phone]

## 2019-09-15 NOTE — Progress Notes (Signed)
Pt unable to urinate; bladder scan showed 450ml; Made NP on call aware. In and out cath has been ordered and executed. Got 600 ml of amber urine out. Will continue to monitor pt.

## 2019-09-15 NOTE — Progress Notes (Addendum)
Gina [name] [id number] [birth year]  CARE TEAM:  PCP: [name], MD  Outpatient Care Team: Patient Care Team: [name], MD as PCP - General (Internal Medicine)  Inpatient Treatment Team: Treatment Team: Attending Provider: Elmarie Shiley, MD; Rounding Team: Willadean Carol, MD; Consulting Physician: Edison Pace, Md, MD; Technician: Gasper Sells, [place]; Technician: Merton Border, NT; Registered Nurse: Debby Bud, RN; Case Manager: Robyne Askew, RN; Utilization Review: Rolland Porter, RN; Technician: Maple Mirza, NT; Social Worker: Alexander Mt, [place]   Problem List:   Principal Problem:   Acute GI bleeding Active Problems:   Enterocutaneous fistula   Hyperglycemia   HTN (hypertension)   Diabetes ([place])   Severe protein-calorie malnutrition ([place])   Anemia of chronic disease   Sacral decubitus ulcer   Perforated gastric ulcer ([place])   S/P BKA (below knee amputation) bilateral ([place])   Iron deficiency anemia   Hypoalbuminemia   Anemia      * No surgery found *   Enterocutaneous fistula             History of perforated pyloric channel ulcer, oversewed with [name] patch - 02/03/2019 - [place]             Has pouch around fistula since 02/2019    Assessment  Enterocutaneus fistula stable.  Concern of low-grade fever without leukocytosis.  [facility] Stay = 3 days)  Plan:  With urinary retention would rule out urinary sepsis or other sources.  Had abnormal urine culture 7/2.  I am skeptical that she has any new intra-abdominal pathology.  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  Fecalization of contents on CAT scan most likely due to it being a chronic fistula that is naturally thickening up.  There is no evidence of any perforation, ischemia, pneumatosis, obstruction.   Can check C. difficile to rule out other infection.  There is nothing surgically to do to try and  drain or assess this.  There is no undrained collection.    Continue pouching and wound care.  Wound ostomy consult reevaluation.   Still having a fair amount of bowel movements transanally which argues against complete fistula.  Supplemental shakes.  Severe protein calorie malnutrition.  Severely deconditioned and bedridden state makes operative intervention with extremely high failure rate.  Restarting TNA  Hemoglobin stable.  PPI for perforated ulcer.  Gastritis and irritation on an upper endoscopy by Dr. Michail Sermon with [facility] gastroenterology May 2021.  No malignancy.  Defer to GI with  further management  There are no acute surgical needs at this time.  Surgery will be available but will follow more peripherally at this time.   -VTE prophylaxis- SCDs, etc -mobilize as tolerated to help recovery  20 minutes spent in review, evaluation, examination, counseling, and coordination of care.  More than 50% of that time was spent in counseling.  09/15/2019     Objective:  Vital signs:  Vitals:   09/14/19 1500 09/14/19 1743 09/14/19 2128 09/15/19 0438  BP: (!) 114/55 (!) 111/53 130/62 (!) 115/53  Pulse: 88 89 90 88  Resp: 17 17 17 17   Temp: 98.2 F (36.8 C) (!) 100.4 F (38 C) 99 F (37.2 C) 99.5 F (37.5 C)  TempSrc: Oral Oral Tympanic Tympanic  SpO2: 96% 94% 95% 92%  Weight:      Height:        Last BM Date: 09/14/19  Intake/Output  Yesterday:  07/04 0701 - 07/05 0700 In: 1484.9 [I.V.:1346.5; IV Piggyback:138.4] Out: 5573 [Urine:1000; Drains:100; Stool:175] This shift:  No intake/output data recorded.  Bowel function:  Flatus: YES  BM:  YES  Drain: (No drain)       Results:   Cultures: Recent Results (from the past 720 hour(s))  SARS Coronavirus 2 by RT PCR (hospital order, performed in [facility] hospital lab) Nasopharyngeal Nasopharyngeal Swab     Status: None   Collection Time: 08/17/19  8:55 AM   Specimen: Nasopharyngeal Swab  Result Value Ref  Range Status   SARS Coronavirus 2 NEGATIVE NEGATIVE Final    Comment: (NOTE) SARS-CoV-2 target nucleic acids are NOT DETECTED. The SARS-CoV-2 RNA is generally detectable in upper and lower respiratory specimens during the acute phase of infection. The lowest concentration of SARS-CoV-2 viral copies this assay can detect is 250 copies / mL. A negative result does not preclude SARS-CoV-2 infection and should not be used as the sole basis for treatment or other patient management decisions.  A negative result may occur with improper specimen collection / handling, submission of specimen other than nasopharyngeal swab, presence of viral mutation(s) within the areas targeted by this assay, and inadequate number of viral copies (<250 copies / mL). A negative result must be combined with clinical observations, patient history, and epidemiological information. Fact Sheet for Patients:   StrictlyIdeas.no Fact Sheet for Healthcare Providers: BankingDealers.co.za This test is not yet approved or cleared  by the [place] FDA and has been authorized for detection and/or diagnosis of SARS-CoV-2 by FDA under an Emergency Use Authorization (EUA).  This EUA will remain in effect (meaning this test can be used) for the duration of the COVID-19 declaration under Section 564(b)(1) of the Act, 21 U.S.C. section 360bbb-3(b)(1), unless the authorization is terminated or revoked sooner. Performed at [facility], [place] [address] [place], [place] [address]   Urine culture     Status: Abnormal   Collection Time: 08/17/19  9:05 AM   Specimen: In/Out Cath Urine  Result Value Ref Range Status   Specimen Description IN/OUT CATH URINE  Final   Special Requests   Final    NONE Performed at [facility], [place] [address], [place], [place] [address]    Culture >=100,000 COLONIES/mL PROTEUS MIRABILIS (A)  Final   Report Status 08/20/2019 FINAL  Final     Organism ID, Bacteria PROTEUS MIRABILIS (A)  Final      Susceptibility   Proteus mirabilis - MIC*    AMPICILLIN <=2 SENSITIVE Sensitive     CEFAZOLIN <=4 SENSITIVE Sensitive     CEFTRIAXONE <=1 SENSITIVE Sensitive     CIPROFLOXACIN >=4 RESISTANT Resistant     GENTAMICIN <=1 SENSITIVE Sensitive     IMIPENEM 2 SENSITIVE Sensitive     NITROFURANTOIN 128 RESISTANT Resistant     TRIMETH/SULFA >=320 RESISTANT Resistant     AMPICILLIN/SULBACTAM <=2 SENSITIVE Sensitive     PIP/TAZO <=4 SENSITIVE Sensitive     * >=100,000 COLONIES/mL PROTEUS MIRABILIS  Blood Culture (routine x 2)     Status: Abnormal   Collection Time: 08/17/19  9:50 AM   Specimen: BLOOD LEFT ARM  Result Value Ref Range Status   Specimen Description BLOOD LEFT ARM  Final   Special Requests   Final    BOTTLES DRAWN AEROBIC AND ANAEROBIC Blood Culture adequate volume   Culture  Setup Time   Final    BUDDING YEAST SEEN IN BOTH AEROBIC AND  ANAEROBIC BOTTLES CRITICAL RESULT CALLED TO, READ BACK BY AND VERIFIED WITH: Park Breed [id number] FCP Performed at [facility], [place] [address], [place], [place] [address]    Culture CANDIDA GLABRATA (A)  Final   Report Status 08/21/2019 FINAL  Final  Blood Culture ID Panel (Reflexed)     Status: Abnormal   Collection Time: 08/17/19  9:50 AM  Result Value Ref Range Status   Enterococcus species NOT DETECTED NOT DETECTED Final   Listeria monocytogenes NOT DETECTED NOT DETECTED Final   Staphylococcus species NOT DETECTED NOT DETECTED Final   Staphylococcus aureus (BCID) NOT DETECTED NOT DETECTED Final   Streptococcus species NOT DETECTED NOT DETECTED Final   Streptococcus agalactiae NOT DETECTED NOT DETECTED Final   Streptococcus pneumoniae NOT DETECTED NOT DETECTED Final   Streptococcus pyogenes NOT DETECTED NOT DETECTED Final   Acinetobacter baumannii NOT DETECTED NOT DETECTED Final   Enterobacteriaceae species NOT DETECTED NOT DETECTED Final   Enterobacter cloacae  complex NOT DETECTED NOT DETECTED Final   Escherichia coli NOT DETECTED NOT DETECTED Final   Klebsiella oxytoca NOT DETECTED NOT DETECTED Final   Klebsiella pneumoniae NOT DETECTED NOT DETECTED Final   Proteus species NOT DETECTED NOT DETECTED Final   Serratia marcescens NOT DETECTED NOT DETECTED Final   Haemophilus influenzae NOT DETECTED NOT DETECTED Final   Neisseria meningitidis NOT DETECTED NOT DETECTED Final   Pseudomonas aeruginosa NOT DETECTED NOT DETECTED Final   Candida albicans NOT DETECTED NOT DETECTED Final   Candida glabrata DETECTED (A) NOT DETECTED Final    Comment: CRITICAL RESULT CALLED TO, READ BACK BY AND VERIFIED WITH: PHARMD JEREMY F. [id number] FCP    Candida krusei NOT DETECTED NOT DETECTED Final   Candida parapsilosis NOT DETECTED NOT DETECTED Final   Candida tropicalis NOT DETECTED NOT DETECTED Final    Comment: Performed at [facility], [address] [place], [place] [address]  Blood Culture (routine x 2)     Status: Abnormal   Collection Time: 08/17/19  1:12 PM   Specimen: BLOOD LEFT ARM  Result Value Ref Range Status   Specimen Description BLOOD LEFT ARM  Final   Special Requests   Final    BOTTLES DRAWN AEROBIC ONLY Blood Culture adequate volume   Culture  Setup Time   Final    AEROBIC BOTTLE ONLY YEAST CRITICAL VALUE NOTED.  VALUE IS CONSISTENT WITH PREVIOUSLY REPORTED AND CALLED VALUE. Performed at [facility], [place] [address] [place], [place] [address]    Culture CANDIDA GLABRATA (A)  Final   Report Status 08/21/2019 FINAL  Final  MRSA PCR Screening     Status: Abnormal   Collection Time: 08/17/19  8:23 PM   Specimen: Nasal Mucosa; Nasopharyngeal  Result Value Ref Range Status   MRSA by PCR POSITIVE (A) NEGATIVE Final    Comment:        The GeneXpert MRSA Assay (FDA approved for NASAL specimens only), is one component of a comprehensive MRSA colonization surveillance program. It is not intended to diagnose MRSA infection  nor to guide or monitor treatment for MRSA infections. RESULT CALLED TO, READ BACK BY AND VERIFIED WITH: J. MILLER,CHARGE RN [id number] 08/17/2019 Mena Goes Performed at [facility], [place] [address], [place], [place] [address]   Culture, blood (routine x 2)     Status: Abnormal   Collection Time: 08/19/19  5:56 AM   Specimen: BLOOD LEFT HAND  Result Value Ref Range Status  Specimen Description BLOOD LEFT HAND  Final   Special Requests   Final    BOTTLES DRAWN AEROBIC AND ANAEROBIC Blood Culture adequate volume   Culture  Setup Time   Final    ANAEROBIC BOTTLE ONLY YEAST CRITICAL VALUE NOTED.  VALUE IS CONSISTENT WITH PREVIOUSLY REPORTED AND CALLED VALUE. Performed at [facility], [place] [address], [place], [place] [address]    Culture CANDIDA GLABRATA (A)  Final   Report Status 08/26/2019 FINAL  Final  Culture, blood (routine x 2)     Status: Abnormal   Collection Time: 08/19/19  5:56 AM   Specimen: BLOOD LEFT HAND  Result Value Ref Range Status   Specimen Description BLOOD LEFT HAND  Final   Special Requests   Final    BOTTLES DRAWN AEROBIC AND ANAEROBIC Blood Culture adequate volume   Culture  Setup Time (A)  Final    YEAST AEROBIC BOTTLE ONLY CRITICAL RESULT CALLED TO, READ BACK BY AND VERIFIED WITH: PHARMD Y PATEL [id number] AT 86 AM BY CM Performed at [facility], [place] [address] [place],  [place] [address]    Culture CANDIDA GLABRATA (A)  Final   Report Status 08/24/2019 FINAL  Final  Culture, blood (routine x 2)     Status: None   Collection Time: 08/23/19 11:17 AM   Specimen: BLOOD LEFT FOREARM  Result Value Ref Range Status   Specimen Description BLOOD LEFT FOREARM  Final   Special Requests   Final    BOTTLES DRAWN AEROBIC ONLY Blood Culture adequate volume   Culture   Final    NO GROWTH 5 DAYS Performed at [facility], [place] [address], [place], [place] [address]    Report Status 08/28/2019 FINAL  Final  Culture, blood (routine x 2)     Status:  None   Collection Time: 08/23/19 11:22 AM   Specimen: BLOOD LEFT FOREARM  Result Value Ref Range Status   Specimen Description BLOOD LEFT FOREARM  Final   Special Requests   Final    BOTTLES DRAWN AEROBIC ONLY Blood Culture adequate volume   Culture   Final    NO GROWTH 5 DAYS Performed at [facility], [place] [address], [place], [place] [address]    Report Status 08/28/2019 FINAL  Final  SARS Coronavirus 2 by RT PCR (hospital order, performed in [facility] hospital lab) Nasopharyngeal Nasopharyngeal Swab     Status: None   Collection Time: 09/28/2019 10:06 PM   Specimen: Nasopharyngeal Swab  Result Value Ref Range Status   SARS Coronavirus 2 NEGATIVE NEGATIVE Final    Comment: (NOTE) SARS-CoV-2 target nucleic acids are NOT DETECTED.  The SARS-CoV-2 RNA is generally detectable in upper and lower respiratory specimens during the acute phase of infection. The lowest concentration of SARS-CoV-2 viral copies this assay can detect is 250 copies / mL. A negative result does not preclude SARS-CoV-2 infection and should not be used as the sole basis for treatment or other patient management decisions.  A negative result may occur with improper specimen collection / handling, submission of specimen other than nasopharyngeal swab, presence of viral mutation(s) within the areas targeted by this assay, and inadequate number of viral copies (<250 copies / mL). A negative result must be combined with clinical observations, patient history, and epidemiological information.  Fact Sheet for Patients:   StrictlyIdeas.no  Fact Sheet for Healthcare Providers: BankingDealers.co.za  This test is not yet approved or  cleared by the [place]  States FDA and has been authorized for detection and/or diagnosis of SARS-CoV-2 by FDA under an Emergency Use Authorization (EUA).  This EUA will remain in effect (meaning this test can be used) for the duration of  the COVID-19 declaration under Section 564(b)(1) of the Act, 21 U.S.C. section 360bbb-3(b)(1), unless the authorization is terminated or revoked sooner.  Performed at [facility], [place] [address] [place], [place] [address]   Culture, blood (Routine X 2) w Reflex to ID Panel     Status: None (Preliminary result)   Collection Time: 09/12/19  4:05 AM   Specimen: BLOOD  Result Value Ref Range Status   Specimen Description BLOOD PICC LINE  Final   Special Requests   Final    BOTTLES DRAWN AEROBIC AND ANAEROBIC Blood Culture results may not be optimal due to an excessive volume of blood received in culture bottles   Culture   Final    NO GROWTH 3 DAYS Performed at [facility], [place] [address] [place], [place] [address]    Report Status PENDING  Incomplete  Urine Culture     Status: Abnormal   Collection Time: 09/12/19  8:36 AM   Specimen: Urine, Random  Result Value Ref Range Status   Specimen Description URINE, RANDOM  Final   Special Requests   Final    NONE Performed at [facility], [place] [address], [place], [place] [address]    Culture MULTIPLE SPECIES PRESENT, SUGGEST RECOLLECTION (A)  Final   Report Status 09/13/2019 FINAL  Final  Culture, blood (Routine X 2) w Reflex to ID Panel     Status: None (Preliminary result)   Collection Time: 09/12/19 12:14 PM   Specimen: BLOOD  Result Value Ref Range Status   Specimen Description BLOOD LEFT ANTECUBITAL  Final   Special Requests   Final    BOTTLES DRAWN AEROBIC AND ANAEROBIC Blood Culture results may not be optimal due to an inadequate volume of blood received in culture bottles   Culture   Final    NO GROWTH 3 DAYS Performed at [facility], [place] [address] [place], [place] [address]    Report Status PENDING  Incomplete    Labs: Results for orders placed or performed during the hospital encounter of 09/26/2019 (from the past 48 hour(s))  Glucose, capillary     Status: Abnormal   Collection Time:  09/13/19  5:23 PM  Result Value Ref Range   Glucose-Capillary 104 (H) 70 - 99 mg/dL    Comment: Glucose reference range applies only to samples taken after fasting for at least 8 hours.  Hemoglobin and hematocrit, blood     Status: Abnormal   Collection Time: 09/13/19  6:22 PM  Result Value Ref Range   Hemoglobin 8.6 (L) 12.0 - 15.0 g/dL    Comment: REPEATED TO VERIFY POST TRANSFUSION SPECIMEN    HCT 27.4 (L) 36 - 46 %    Comment: Performed at [facility] [address], [place], [place] [address]  Glucose, capillary     Status: Abnormal   Collection Time: 09/13/19  9:34 PM  Result Value Ref Range   Glucose-Capillary 118 (H) 70 - 99 mg/dL    Comment: Glucose reference range applies only to samples taken after fasting for at least 8 hours.  Procalcitonin     Status: None   Collection Time: 09/14/19  4:16 AM  Result Value Ref Range   Procalcitonin 0.33 ng/mL  Comment:        Interpretation: PCT (Procalcitonin) <= 0.5 ng/mL: Systemic infection (sepsis) is not likely. Local bacterial infection is possible. (NOTE)       Sepsis PCT Algorithm           Lower Respiratory Tract                                      Infection PCT Algorithm    ----------------------------     ----------------------------         PCT < 0.25 ng/mL                PCT < 0.10 ng/mL          Strongly encourage             Strongly discourage   discontinuation of antibiotics    initiation of antibiotics    ----------------------------     -----------------------------       PCT 0.25 - 0.50 ng/mL            PCT 0.10 - 0.25 ng/mL               OR       >80% decrease in PCT            Discourage initiation of                                            antibiotics      Encourage discontinuation           of antibiotics    ----------------------------     -----------------------------         PCT >= 0.50 ng/mL              PCT 0.26 - 0.50 ng/mL               AND        <80% decrease in PCT              Encourage initiation of                                             antibiotics       Encourage continuation           of antibiotics    ----------------------------     -----------------------------        PCT >= 0.50 ng/mL                  PCT > 0.50 ng/mL               AND         increase in PCT                  Strongly encourage                                      initiation of antibiotics    Strongly encourage escalation           of antibiotics                                     -----------------------------  PCT <= 0.25 ng/mL                                                 OR                                        > 80% decrease in PCT                                      Discontinue / Do not initiate                                             antibiotics  Performed at [facility], [place] [address], [place], [place] [address]   Basic metabolic panel     Status: Abnormal   Collection Time: 09/14/19  4:16 AM  Result Value Ref Range   Sodium 135 135 - 145 mmol/L   Potassium 4.0 3.5 - 5.1 mmol/L   Chloride 105 98 - 111 mmol/L   CO2 22 22 - 32 mmol/L   Glucose, Bld 170 (H) 70 - 99 mg/dL    Comment: Glucose reference range applies only to samples taken after fasting for at least 8 hours.   BUN 27 (H) 6 - 20 mg/dL   Creatinine, Ser 1.54 (H) 0.44 - 1.00 mg/dL   Calcium 8.1 (L) 8.9 - 10.3 mg/dL   GFR calc non Af Amer 37 (L) >60 mL/min   GFR calc Af Amer 43 (L) >60 mL/min   Anion gap 8 5 - 15    Comment: Performed at [facility] [address] [place], [place] [address]  CBC     Status: Abnormal   Collection Time: 09/14/19  4:16 AM  Result Value Ref Range   WBC 12.2 (H) 4.0 - 10.5 K/uL   RBC 2.84 (L) 3.87 - 5.11 MIL/uL   Hemoglobin 8.5 (L) 12.0 - 15.0 g/dL   HCT 26.9 (L) 36 - 46 %   MCV 94.7 80.0 - 100.0 fL   MCH 29.9 26.0 - 34.0 pg   MCHC 31.6 30.0 - 36.0 g/dL   RDW 15.0 11.5 - 15.5 %   Platelets 356 150 - 400  K/uL   nRBC 0.0 0.0 - 0.2 %    Comment: Performed at [facility], [place] [address], [place], [place] [address]  Glucose, capillary     Status: Abnormal   Collection Time: 09/14/19  7:59 AM  Result Value Ref Range   Glucose-Capillary 127 (H) 70 - 99 mg/dL    Comment: Glucose reference range applies only to samples taken after fasting for at least 8 hours.  Glucose, capillary     Status: Abnormal   Collection Time: 09/14/19 12:14 PM  Result Value Ref Range   Glucose-Capillary 66 (L) 70 - 99 mg/dL    Comment: Glucose reference range applies only to samples taken after fasting for at least 8 hours.  Glucose, capillary     Status: None   Collection Time: 09/14/19  1:37 PM  Result Value Ref  Range   Glucose-Capillary 95 70 - 99 mg/dL    Comment: Glucose reference range applies only to samples taken after fasting for at least 8 hours.  Glucose, capillary     Status: Abnormal   Collection Time: 09/14/19  4:59 PM  Result Value Ref Range   Glucose-Capillary 168 (H) 70 - 99 mg/dL    Comment: Glucose reference range applies only to samples taken after fasting for at least 8 hours.  Glucose, capillary     Status: Abnormal   Collection Time: 09/14/19  9:31 PM  Result Value Ref Range   Glucose-Capillary 242 (H) 70 - 99 mg/dL    Comment: Glucose reference range applies only to samples taken after fasting for at least 8 hours.  Comprehensive metabolic panel     Status: Abnormal   Collection Time: 09/15/19  4:20 AM  Result Value Ref Range   Sodium 137 135 - 145 mmol/L   Potassium 3.6 3.5 - 5.1 mmol/L   Chloride 110 98 - 111 mmol/L   CO2 19 (L) 22 - 32 mmol/L   Glucose, Bld 262 (H) 70 - 99 mg/dL    Comment: Glucose reference range applies only to samples taken after fasting for at least 8 hours.   BUN 28 (H) 6 - 20 mg/dL   Creatinine, Ser 2.28 (H) 0.44 - 1.00 mg/dL   Calcium 8.1 (L) 8.9 - 10.3 mg/dL   Total Protein 6.2 (L) 6.5 - 8.1 g/dL   Albumin 1.5 (L) 3.5 - 5.0 g/dL   AST 11 (L) 15 -  41 U/L   ALT 9 0 - 44 U/L   Alkaline Phosphatase 73 38 - 126 U/L   Total Bilirubin 0.7 0.3 - 1.2 mg/dL   GFR calc non Af Amer 23 (L) >60 mL/min   GFR calc Af Amer 27 (L) >60 mL/min   Anion gap 8 5 - 15    Comment: Performed at [facility] [address], [place], [place] [address]  Prealbumin     Status: Abnormal   Collection Time: 09/15/19  4:20 AM  Result Value Ref Range   Prealbumin <5 (L) 18 - 38 mg/dL    Comment: Performed at [facility] [address], [place], [place] [address]  Magnesium     Status: Abnormal   Collection Time: 09/15/19  4:20 AM  Result Value Ref Range   Magnesium 1.5 (L) 1.7 - 2.4 mg/dL    Comment: Performed at [facility] [address] [place], [place] [address]  Phosphorus     Status: None   Collection Time: 09/15/19  4:20 AM  Result Value Ref Range   Phosphorus 3.7 2.5 - 4.6 mg/dL    Comment: Performed at [facility] [address] [place], [place] [address]  Triglycerides     Status: None   Collection Time: 09/15/19  4:20 AM  Result Value Ref Range   Triglycerides 126 <150 mg/dL    Comment: Performed at [facility] [address], [place], [place] [address]  CBC     Status: Abnormal   Collection Time: 09/15/19  4:20 AM  Result Value Ref Range   WBC 13.2 (H) 4.0 - 10.5 K/uL   RBC 2.43 (L) 3.87 - 5.11 MIL/uL   Hemoglobin 7.4 (L) 12.0 - 15.0 g/dL   HCT 23.3 (L) 36 - 46 %   MCV 95.9 80.0 - 100.0 fL   MCH 30.5 26.0 - 34.0 pg  MCHC 31.8 30.0 - 36.0 g/dL   RDW 15.2 11.5 - 15.5 %   Platelets 331 150 - 400 K/uL   nRBC 0.0 0.0 - 0.2 %    Comment: Performed at [facility], [place] [address], [place], [place] [address]  Differential     Status: Abnormal   Collection Time: 09/15/19  4:20 AM  Result Value Ref Range   Neutrophils Relative % 76 %   Neutro Abs 10.0 (H) 1.7 - 7.7 K/uL   Lymphocytes Relative 12 %   Lymphs Abs 1.6 0.7 - 4.0 K/uL   Monocytes Relative 11 %   Monocytes Absolute 1.5 (H) 0 - 1 K/uL     Eosinophils Relative 0 %   Eosinophils Absolute 0.0 0 - 0 K/uL   Basophils Relative 0 %   Basophils Absolute 0.0 0 - 0 K/uL   WBC Morphology MILD LEFT SHIFT (1-5% METAS, OCC MYELO, OCC BANDS)    Immature Granulocytes 1 %   Abs Immature Granulocytes 0.09 (H) 0.00 - 0.07 K/uL    Comment: Performed at [facility] [address] [place], [place] [address]  Glucose, capillary     Status: Abnormal   Collection Time: 09/15/19  7:49 AM  Result Value Ref Range   Glucose-Capillary 237 (H) 70 - 99 mg/dL    Comment: Glucose reference range applies only to samples taken after fasting for at least 8 hours.    Imaging / Studies: No results found.  Medications / Allergies: per chart  Antibiotics: Anti-infectives (From admission, onward)   Start     Dose/Rate Route Frequency Ordered Stop   09/13/19 1030  anidulafungin (ERAXIS) 100 mg in sodium chloride 0.9 % 100 mL IVPB  Status:  Discontinued        100 mg 78 mL/hr over 100 Minutes Intravenous Every 24 hours 09/12/19 0952 09/14/19 1230   09/12/19 1030  anidulafungin (ERAXIS) 200 mg in sodium chloride 0.9 % 200 mL IVPB  Status:  Discontinued        200 mg 78 mL/hr over 200 Minutes Intravenous  Once 09/12/19 3151 09/15/19 7616        Note: Portions of this report may have been transcribed using voice recognition software. Every effort was made to ensure accuracy; however, inadvertent computerized transcription errors may be present.   Any transcriptional errors that result from this process are unintentional.    Adin Hector, MD, FACS, MASCRS Gastrointestinal and Minimally Invasive Surgery  [facility] [address], [place], [place] [address] [phone] Fax [phone] Main/Paging  CONTACT INFORMATION: Weekday (9AM-5PM) concerns: Call [facility] main office at [phone] Weeknight (5PM-9AM) or Weekend/Holiday concerns: Check [email or web address] for General Surgery [facility] coverage (Please, do not use  SecureChat as it is not reliable communication to operating surgeons for immediate patient care)      09/15/2019  11:54 AM

## 2019-09-15 NOTE — Progress Notes (Signed)
[facility] for Infectious Disease  Date of Admission:  09/20/2019     Total days of antibiotics 2         ASSESSMENT:  Gina [name] continues to have elevated temperatures of unclear source. No current surgical interventions recommended at this time with nothing to try and drain given no un drained collection. Blood cultures from 7/2 have been without growth to date. Previous urine cultures with multiple species present. Treated 1 month ago Candida Glabrata fungemia. Certainly would consider removal of the PICC line for holiday. Continue to monitor off antibiotics for the next 24 hours.   PLAN:  1. Monitor off antibiotics for the next 24 hours.  2. Consider PICC line removal and holiday 3. Monitor blood cultures and consider additional culture if continues to be febrile.   Principal Problem:   Acute GI bleeding Active Problems:   Hyperglycemia   HTN (hypertension)   Diabetes (HCC)   Severe protein-calorie malnutrition (HCC)   Anemia of chronic disease   Sacral decubitus ulcer   Perforated gastric ulcer (HCC)   S/P BKA (below knee amputation) bilateral (HCC)   Enterocutaneous fistula   Iron deficiency anemia   Hypoalbuminemia   Anemia   . Chlorhexidine Gluconate Cloth  6 each Topical Daily  . cholestyramine  4 g Oral BID  . collagenase   Topical Daily  . cyanocobalamin  1,000 mcg Intramuscular Once  . feeding supplement  1 Container Oral TID BM  . gabapentin  300 mg Oral BID  . insulin aspart  0-15 Units Subcutaneous TID WC  . insulin aspart  0-5 Units Subcutaneous QHS  . metoprolol tartrate  50 mg Oral BID  . nystatin  5 mL Oral QID  . pantoprazole  40 mg Intravenous Q12H  . sodium chloride flush  10-40 mL Intracatheter Q12H    SUBJECTIVE:  Elevated temperature overnight. Not feeling well today. Wondering why she is tired and sleepy. Several questions regarding the plan of care. Urinary retention overnight and need for in and out catheterization.   Allergies   Allergen Reactions  . Bee Venom Swelling    Swells up with bee stings   . Metformin And Related Other (See Comments)    Upset stomach     Review of Systems: Review of Systems  Constitutional: Positive for malaise/fatigue. Negative for chills, fever and weight loss.  Respiratory: Negative for cough, shortness of breath and wheezing.   Cardiovascular: Negative for chest pain and leg swelling.  Gastrointestinal: Negative for abdominal pain, constipation, diarrhea, nausea and vomiting.  Genitourinary:       Positive for urinary retention.   Skin: Negative for rash.      OBJECTIVE: Vitals:   09/14/19 1500 09/14/19 1743 09/14/19 2128 09/15/19 0438  BP: (!) 114/55 (!) 111/53 130/62 (!) 115/53  Pulse: 88 89 90 88  Resp: 17 17 17 17   Temp: 98.2 F (36.8 C) (!) 100.4 F (38 C) 99 F (37.2 C) 99.5 F (37.5 C)  TempSrc: Oral Oral Tympanic Tympanic  SpO2: 96% 94% 95% 92%  Weight:      Height:       Body mass index is 18.29 kg/m.  Physical Exam Constitutional:      General: She is not in acute distress.    Appearance: She is well-developed.  Cardiovascular:     Rate and Rhythm: Normal rate and regular rhythm.     Heart sounds: Normal heart sounds.     Comments: PICC line right upper extremity  with clean and dry dressing.  Pulmonary:     Effort: Pulmonary effort is normal.     Breath sounds: Normal breath sounds.  Skin:    General: Skin is warm and dry.  Neurological:     Mental Status: She is alert and oriented to person, place, and time.  Psychiatric:        Mood and Affect: Mood normal.     Lab Results Lab Results  Component Value Date   WBC 13.2 (H) 09/15/2019   HGB 7.4 (L) 09/15/2019   HCT 23.3 (L) 09/15/2019   MCV 95.9 09/15/2019   PLT 331 09/15/2019    Lab Results  Component Value Date   CREATININE 2.28 (H) 09/15/2019   BUN 28 (H) 09/15/2019   NA 137 09/15/2019   K 3.6 09/15/2019   CL 110 09/15/2019   CO2 19 (L) 09/15/2019    Lab Results    Component Value Date   ALT 9 09/15/2019   AST 11 (L) 09/15/2019   ALKPHOS 73 09/15/2019   BILITOT 0.7 09/15/2019     Microbiology: Recent Results (from the past 240 hour(s))  SARS Coronavirus 2 by RT PCR (hospital order, performed in [facility] hospital lab) Nasopharyngeal Nasopharyngeal Swab     Status: None   Collection Time: 09/14/2019 10:06 PM   Specimen: Nasopharyngeal Swab  Result Value Ref Range Status   SARS Coronavirus 2 NEGATIVE NEGATIVE Final    Comment: (NOTE) SARS-CoV-2 target nucleic acids are NOT DETECTED.  The SARS-CoV-2 RNA is generally detectable in upper and lower respiratory specimens during the acute phase of infection. The lowest concentration of SARS-CoV-2 viral copies this assay can detect is 250 copies / mL. A negative result does not preclude SARS-CoV-2 infection and should not be used as the sole basis for treatment or other patient management decisions.  A negative result may occur with improper specimen collection / handling, submission of specimen other than nasopharyngeal swab, presence of viral mutation(s) within the areas targeted by this assay, and inadequate number of viral copies (<250 copies / mL). A negative result must be combined with clinical observations, patient history, and epidemiological information.  Fact Sheet for Patients:   StrictlyIdeas.no  Fact Sheet for Healthcare Providers: BankingDealers.co.za  This test is not yet approved or  cleared by the [place] FDA and has been authorized for detection and/or diagnosis of SARS-CoV-2 by FDA under an Emergency Use Authorization (EUA).  This EUA will remain in effect (meaning this test can be used) for the duration of the COVID-19 declaration under Section 564(b)(1) of the Act, 21 U.S.C. section 360bbb-3(b)(1), unless the authorization is terminated or revoked sooner.  Performed at [facility], [place] [address]  [place], [place] [address]   Culture, blood (Routine X 2) w Reflex to ID Panel     Status: None (Preliminary result)   Collection Time: 09/12/19  4:05 AM   Specimen: BLOOD  Result Value Ref Range Status   Specimen Description BLOOD PICC LINE  Final   Special Requests   Final    BOTTLES DRAWN AEROBIC AND ANAEROBIC Blood Culture results may not be optimal due to an excessive volume of blood received in culture bottles   Culture   Final    NO GROWTH 3 DAYS Performed at [facility], [place] [address] [place],  [address]    Report Status PENDING  Incomplete  Urine Culture     Status: Abnormal   Collection Time: 09/12/19  8:36  AM   Specimen: Urine, Random  Result Value Ref Range Status   Specimen Description URINE, RANDOM  Final   Special Requests   Final    NONE Performed at [facility], [address], [place], [place] [address]    Culture MULTIPLE SPECIES PRESENT, SUGGEST RECOLLECTION (A)  Final   Report Status 09/13/2019 FINAL  Final  Culture, blood (Routine X 2) w Reflex to ID Panel     Status: None (Preliminary result)   Collection Time: 09/12/19 12:14 PM   Specimen: BLOOD  Result Value Ref Range Status   Specimen Description BLOOD LEFT ANTECUBITAL  Final   Special Requests   Final    BOTTLES DRAWN AEROBIC AND ANAEROBIC Blood Culture results may not be optimal due to an inadequate volume of blood received in culture bottles   Culture   Final    NO GROWTH 3 DAYS Performed at [facility], [place] [address] [place],  [address]    Report Status PENDING  Incomplete     Terri Piedra, [facility] for Infectious Disease [facility]  09/15/2019  12:26 PM

## 2019-09-16 DIAGNOSIS — E43 Unspecified severe protein-calorie malnutrition: Secondary | ICD-10-CM

## 2019-09-16 DIAGNOSIS — L89159 Pressure ulcer of sacral region, unspecified stage: Secondary | ICD-10-CM

## 2019-09-16 LAB — PHOSPHORUS: Phosphorus: 3.3 mg/dL (ref 2.5–4.6)

## 2019-09-16 LAB — COMPREHENSIVE METABOLIC PANEL
ALT: 8 U/L (ref 0–44)
AST: 12 U/L — ABNORMAL LOW (ref 15–41)
Albumin: 1.4 g/dL — ABNORMAL LOW (ref 3.5–5.0)
Alkaline Phosphatase: 82 U/L (ref 38–126)
Anion gap: 11 (ref 5–15)
BUN: 32 mg/dL — ABNORMAL HIGH (ref 6–20)
CO2: 17 mmol/L — ABNORMAL LOW (ref 22–32)
Calcium: 7.9 mg/dL — ABNORMAL LOW (ref 8.9–10.3)
Chloride: 105 mmol/L (ref 98–111)
Creatinine, Ser: 2.12 mg/dL — ABNORMAL HIGH (ref 0.44–1.00)
GFR calc Af Amer: 29 mL/min — ABNORMAL LOW (ref >60)
GFR calc non Af Amer: 25 mL/min — ABNORMAL LOW (ref >60)
Glucose, Bld: 256 mg/dL — ABNORMAL HIGH (ref 70–99)
Potassium: 3.2 mmol/L — ABNORMAL LOW (ref 3.5–5.1)
Sodium: 133 mmol/L — ABNORMAL LOW (ref 135–145)
Total Bilirubin: 0.6 mg/dL (ref 0.3–1.2)
Total Protein: 6.3 g/dL — ABNORMAL LOW (ref 6.5–8.1)

## 2019-09-16 LAB — URINE CULTURE: Culture: NO GROWTH

## 2019-09-16 LAB — CBC
HCT: 22.1 % — ABNORMAL LOW (ref 36.0–46.0)
Hemoglobin: 7 g/dL — ABNORMAL LOW (ref 12.0–15.0)
MCH: 29.9 pg (ref 26.0–34.0)
MCHC: 31.7 g/dL (ref 30.0–36.0)
MCV: 94.4 fL (ref 80.0–100.0)
Platelets: 353 10*3/uL (ref 150–400)
RBC: 2.34 MIL/uL — ABNORMAL LOW (ref 3.87–5.11)
RDW: 15.3 % (ref 11.5–15.5)
WBC: 11.9 10*3/uL — ABNORMAL HIGH (ref 4.0–10.5)
nRBC: 0 % (ref 0.0–0.2)

## 2019-09-16 LAB — GLUCOSE, CAPILLARY
Glucose-Capillary: 118 mg/dL — ABNORMAL HIGH (ref 70–99)
Glucose-Capillary: 150 mg/dL — ABNORMAL HIGH (ref 70–99)
Glucose-Capillary: 152 mg/dL — ABNORMAL HIGH (ref 70–99)
Glucose-Capillary: 197 mg/dL — ABNORMAL HIGH (ref 70–99)
Glucose-Capillary: 250 mg/dL — ABNORMAL HIGH (ref 70–99)

## 2019-09-16 LAB — MAGNESIUM: Magnesium: 2.1 mg/dL (ref 1.7–2.4)

## 2019-09-16 MED ORDER — TRACE MINERALS CU-MN-SE-ZN 300-55-60-3000 MCG/ML IV SOLN: INTRAVENOUS | Status: DC

## 2019-09-16 MED ORDER — POTASSIUM CHLORIDE 10 MEQ/100ML IV SOLN
10.0000 meq | INTRAVENOUS | Status: AC
  Administered 2019-09-16 (×3): 10 meq via INTRAVENOUS
  Filled 2019-09-16 (×3): qty 100

## 2019-09-16 MED ORDER — TRACE MINERALS CU-MN-SE-ZN 300-55-60-3000 MCG/ML IV SOLN
INTRAVENOUS | Status: AC
  Filled 2019-09-16: qty 320

## 2019-09-16 MED ORDER — INSULIN ASPART 100 UNIT/ML ~~LOC~~ SOLN
0.0000 [IU] | SUBCUTANEOUS | Status: DC
  Administered 2019-09-16: 1 [IU] via SUBCUTANEOUS
  Administered 2019-09-16 – 2019-09-17 (×3): 2 [IU] via SUBCUTANEOUS
  Administered 2019-09-17: 1 [IU] via SUBCUTANEOUS
  Administered 2019-09-17: 2 [IU] via SUBCUTANEOUS
  Administered 2019-09-18: 5 [IU] via SUBCUTANEOUS
  Administered 2019-09-18 (×2): 2 [IU] via SUBCUTANEOUS
  Administered 2019-09-18: 3 [IU] via SUBCUTANEOUS
  Administered 2019-09-18: 2 [IU] via SUBCUTANEOUS
  Administered 2019-09-18: 3 [IU] via SUBCUTANEOUS
  Administered 2019-09-19: 2 [IU] via SUBCUTANEOUS
  Administered 2019-09-19: 5 [IU] via SUBCUTANEOUS
  Administered 2019-09-19 (×2): 1 [IU] via SUBCUTANEOUS
  Administered 2019-09-19 (×2): 3 [IU] via SUBCUTANEOUS
  Administered 2019-09-20 (×2): 1 [IU] via SUBCUTANEOUS

## 2019-09-16 NOTE — Progress Notes (Signed)
PHARMACY - TOTAL PARENTERAL NUTRITION CONSULT NOTE  Indication:  EC fistula  Patient Measurements: Height: 5\' 2"  (157.5 cm) Weight: 45.4 kg (100 lb) IBW/kg (Calculated) : 50.1 TPN AdjBW (KG): 45.4 Body mass index is 18.29 kg/m. Usual Weight: 53-60 kg  Assessment:  2 YOF presents on 10/08/2019 with anemia.  Patient has a history of perforated prepyloric ulcer s/p [place].  She has an Chief Strategy Officer in place from January 2021 due to [facility] fistula and has been on TPN since 02/2019.  She was recently admitted with candidemia, resulting in PICC replacement.  Pharmacy consulted to continue TPN.  Patient reports not missing any TPN bags PTA. Per further discussion with MD on 7/2, held off on starting TPN initially due to fever and recent fungemia. TPN resumed 7/5. ID monitoring off antibiotics for now.  Glucose / Insulin: hx DM controlled by diet outpatient. Required insulin gtt last admit. CBGs variable off TPN, 79-262. Now CBGs elevated in 250s since TPN started last night. Utilized 9 units SSI / 24 hrs Electrolytes: Na 133, K 3.2, CO2 down to 17, Mag 2.1 (S/p Mag sulfate 2g IV x 1 yesterday), others WNL Renal: AKI - SCr back down to 2.12 (0.9 on 7/1; BL 0.6-0.8), BUN up to 32; UOP down to 0.3 ml/kg/hr LFTs / TGs: LFTs / Tbili / TG WNL Prealbumin / albumin: Prealbumin down to <5, albumin 1.4 Intake / Output; MIVF: 100 ml drain output/24 hrs. NS at 100 ml/hr GI Imaging: none since admit Surgeries / Procedures: none since admit  Central access: PICC removed 6/12; replaced 6/16 TPN start date: chronic TPN  Nutritional Goals (per [facility] staff on 7/2): kCal: 1472 kCal, Protein: 90g, CHO 180g, ILE 50g, no insulin Infuse 1800 mL over 14 hrs  RD goals on 7/2: 1500-1700 kCal, 65-80g protein, >/= 1.5L fluid per day  Current Nutrition:  HH/CMD Boost TID between meals - all doses charted yesterday  Plan:  Continue TPN at 40 mL/hr at 1800 with CBGs >200 today. Titrate to goal and transition  back to cyclic TPN as appropriate. Electrolytes in TPN: Increase to 78mEq/L of Na, add back small amount of K 57mEq/L and Phos 5 mmol/L, continue 52mEq/L of Mg, Ca removed. Change to max acetate Add standard MVI and trace elements to TPN Change SSI to sensitive q4h SSI and adjust as needed. Add insulin to TPN bag 7/7 if CBGs not controlled. Continue NS at 100 ml/hr for today per MD - f/u further plans tomorrow Monitor TPN labs  K runs x 3 already ordered per MD   Arturo Morton, PharmD, BCPS Please check AMION for all [facility] contact numbers Clinical Pharmacist 09/16/2019 8:56 AM
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
## 2019-09-16 NOTE — TOC Progression Note (Addendum)
Transition of Care [facility]) - Progression Note    Patient Details  Name: Gina [name] MRN: [id number] Date of Birth: [birth year]  [facility] CM/SW Contact  Jacalyn Lefevre Edson Snowball, RN Phone Number: 09/16/2019, 10:57 AM  Clinical Narrative:     Spoke to patient at bedside. She did not not talk to her family last night. Called daughter [name] via phone and placed on speaker phone. [name] and her brother tried to call their mother last night but were unable to do a three way call. They came to visit her last night but it was after visiting hours , so they were not allowed in.   Explained so far TOC team have been unable to find an accepting SNF (TPN).   [name] explained both her and her brother are working and there is no one else to assist patient. They applied for [facility] two months ago and have not received services , [facility] sent patient PCP at the time Posey Pronto?) paperwork to complete, per daughter MD would not sign , because he recommended SNF. Patient changed PCP , went to Dr Marvel Plan with [facility] on [address] 336 [phone]. At first visit Dr Marvel Plan office called EMS to bring patient to hospital.   [facility] contact is [facility] [phone]. Spoke to [employer] with patient and daughter's permission.   Margreta Journey confirmed patient's application is in " consent processing". The state needs a form signed by patient's PCP. Once state receives form , [facility]S has 30 days to do an assessment and submit assessment to the state, then state has 30 days to approve it.   Per [place] [facility]S is meant to be a supplement for the caregiver , not the primary source of care while family is working. Family needs to be available in case there are staffing issues , or aide calls in, doesn't show up etc.   Margreta Journey will discuss case with Colletta Maryland a nurse at [facility] and call NCM back. Per Margreta Journey even if patient is approved for [facility] it could take months to get  approval and staffing to cover.  Per PT note patient is 2 person assist.   Margreta Journey spoke to her supervisor and they feel that a family member would need to be present at least until patient was a one person assist. However, NCM can call the state at [phone] and provide them with Dr Nena Alexander fax number. State will fax application for MD signature and they will review.   Called  [facility] on [address] [phone]. For fax number. Patient's PCP is DR Alford Highland and fax number is [phone]. Called the state [phone] left message asking them to fax patient's CAP application to DR Alford Highland, also left daughter's contact information on voicemail   Expected Discharge Plan: [place] Barriers to Discharge: Continued Medical Work up  Expected Discharge Plan and Services Expected Discharge Plan: [place]       Living arrangements for the past 2 months: Apartment                                       Social Determinants of Health (SDOH) Interventions    Readmission Risk Interventions Readmission Risk Prevention Plan 08/29/2019 08/19/2019 02/10/2019  Post Dischage Appt - - -  Medication Screening - - -  Transportation Screening Complete Complete Complete  PCP follow-up - - -  PCP or Specialist Appt within 3-5 Days - - Not Complete  Not Complete comments - - DC date unknown- pt established with PCP  [place] or [place] - - Complete  Social Work Consult for [place] Planning/Counseling - - Not Complete  SW consult not completed comments - - awaiting call back from daughter and APS  Palliative Care Screening - - Not Complete  Palliative Care Screening Not Complete Comments - - pending need  Medication Review (RN Care Manager) Complete Complete Referral to Pharmacy  PCP or Specialist appointment within 3-5 days of discharge Complete - -  HRI or Home Care Consult Complete Complete -  SW  Recovery Care/Counseling Consult Complete Complete -  Palliative Care Screening Complete Not Applicable -  New Site Complete Not Applicable -  Some recent data might be hidden

## 2019-09-16 NOTE — Progress Notes (Signed)
[facility]  Date of Admission:  10/06/2019     Total days of antibiotics 2         ASSESSMENT:  Ms. Loera has remained afebrile off antibiotics and antifungals over the past 24 hours. Unlikely C. Diff as she has had minimal stool output over the past 24 hours. Urinary retention appears to have resolved and voiding independently. Recommend continuing to monitor off antibiotics.   PLAN:  1. Continue to monitor off antibiotics. 2. Discontinue enteric precautions.   Principal Problem:   Acute GI bleeding Active Problems:   Hyperglycemia   HTN (hypertension)   Diabetes (HCC)   Severe protein-calorie malnutrition (HCC)   Anemia of chronic disease   Sacral decubitus ulcer   Perforated gastric ulcer (HCC)   S/P BKA (below knee amputation) bilateral (HCC)   Enterocutaneous fistula   Iron deficiency anemia   Hypoalbuminemia   Anemia   . Chlorhexidine Gluconate Cloth  6 each Topical Daily  . cholestyramine  4 g Oral BID  . collagenase   Topical Daily  . cyanocobalamin  1,000 mcg Intramuscular Once  . feeding supplement  1 Container Oral TID BM  . gabapentin  300 mg Oral BID  . insulin aspart  0-9 Units Subcutaneous Q4H  . metoprolol tartrate  50 mg Oral BID  . nystatin  5 mL Oral QID  . pantoprazole  40 mg Intravenous Q12H  . sodium chloride flush  10-40 mL Intracatheter Q12H    SUBJECTIVE:  Afebrile overnight with no acute events. Has had 50 cc of stool since 7am per nursing staff.   Allergies  Allergen Reactions  . Bee Venom Swelling    Swells up with bee stings   . Metformin And Related Other (See Comments)    Upset stomach     Review of Systems: Review of Systems  Constitutional: Negative for chills, fever and weight loss.  Respiratory: Negative for cough, shortness of breath and wheezing.   Cardiovascular: Negative for chest pain and leg swelling.  Gastrointestinal: Negative for abdominal pain, constipation, diarrhea, nausea  and vomiting.  Skin: Negative for rash.      OBJECTIVE: Vitals:   09/14/19 2128 09/15/19 0438 09/15/19 1343 09/15/19 2217  BP: 130/62 (!) 115/53 116/67 (!) 160/65  Pulse: 90 88 67 90  Resp: 17 17 18 17   Temp: 99 F (37.2 C) 99.5 F (37.5 C) 98.1 F (36.7 C) 98.8 F (37.1 C)  TempSrc: Tympanic Tympanic Oral Oral  SpO2: 95% 92% 94% 93%  Weight:      Height:       Body mass index is 18.29 kg/m.  Physical Exam Constitutional:      General: She is not in acute distress.    Appearance: She is well-developed.  Cardiovascular:     Rate and Rhythm: Normal rate and regular rhythm.     Heart sounds: Normal heart sounds.     Comments: Right upper extremity PICC line with clean and dry dressing.  Pulmonary:     Effort: Pulmonary effort is normal.     Breath sounds: Normal breath sounds.  Abdominal:     Comments: Enterocutaneous fistula with pouch present.   Skin:    General: Skin is warm and dry.  Neurological:     Mental Status: She is alert and oriented to person, place, and time.     Lab Results Lab Results  Component Value Date   WBC 11.9 (H) 09/16/2019   HGB 7.0 (L) 09/16/2019  HCT 22.1 (L) 09/16/2019   MCV 94.4 09/16/2019   PLT 353 09/16/2019    Lab Results  Component Value Date   CREATININE 2.12 (H) 09/16/2019   BUN 32 (H) 09/16/2019   NA 133 (L) 09/16/2019   K 3.2 (L) 09/16/2019   CL 105 09/16/2019   CO2 17 (L) 09/16/2019    Lab Results  Component Value Date   ALT 8 09/16/2019   AST 12 (L) 09/16/2019   ALKPHOS 82 09/16/2019   BILITOT 0.6 09/16/2019     Microbiology: Recent Results (from the past 240 hour(s))  SARS Coronavirus 2 by RT PCR (hospital order, performed in [facility] hospital lab) Nasopharyngeal Nasopharyngeal Swab     Status: None   Collection Time: 10/02/2019 10:06 PM   Specimen: Nasopharyngeal Swab  Result Value Ref Range Status   SARS Coronavirus 2 NEGATIVE NEGATIVE Final    Comment: (NOTE) SARS-CoV-2 target nucleic acids are  NOT DETECTED.  The SARS-CoV-2 RNA is generally detectable in upper and lower respiratory specimens during the acute phase of infection. The lowest concentration of SARS-CoV-2 viral copies this assay can detect is 250 copies / mL. A negative result does not preclude SARS-CoV-2 infection and should not be used as the sole basis for treatment or other patient management decisions.  A negative result may occur with improper specimen collection / handling, submission of specimen other than nasopharyngeal swab, presence of viral mutation(s) within the areas targeted by this assay, and inadequate number of viral copies (<250 copies / mL). A negative result must be combined with clinical observations, patient history, and epidemiological information.  Fact Sheet for Patients:   StrictlyIdeas.no  Fact Sheet for Healthcare Providers: BankingDealers.co.za  This test is not yet approved or  cleared by the [place] FDA and has been authorized for detection and/or diagnosis of SARS-CoV-2 by FDA under an Emergency Use Authorization (EUA).  This EUA will remain in effect (meaning this test can be used) for the duration of the COVID-19 declaration under Section 564(b)(1) of the Act, 21 U.S.C. section 360bbb-3(b)(1), unless the authorization is terminated or revoked sooner.  Performed at [facility], [place] [address] [place], [place] [address]   Culture, blood (Routine X 2) w Reflex to ID Panel     Status: None (Preliminary result)   Collection Time: 09/12/19  4:05 AM   Specimen: BLOOD  Result Value Ref Range Status   Specimen Description BLOOD PICC LINE  Final   Special Requests   Final    BOTTLES DRAWN AEROBIC AND ANAEROBIC Blood Culture results may not be optimal due to an excessive volume of blood received in culture bottles   Culture   Final    NO GROWTH 3 DAYS Performed at [facility], [place] [address], [place], [place]  [address]    Report Status PENDING  Incomplete  Urine Culture     Status: Abnormal   Collection Time: 09/12/19  8:36 AM   Specimen: Urine, Random  Result Value Ref Range Status   Specimen Description URINE, RANDOM  Final   Special Requests   Final    NONE Performed at [facility], [place] [address], [place], [place] [address]    Culture MULTIPLE SPECIES PRESENT, SUGGEST RECOLLECTION (A)  Final   Report Status 09/13/2019 FINAL  Final  Culture, blood (Routine X 2) w Reflex to ID Panel     Status: None (Preliminary result)   Collection Time: 09/12/19 12:14 PM   Specimen:  BLOOD  Result Value Ref Range Status   Specimen Description BLOOD LEFT ANTECUBITAL  Final   Special Requests   Final    BOTTLES DRAWN AEROBIC AND ANAEROBIC Blood Culture results may not be optimal due to an inadequate volume of blood received in culture bottles   Culture   Final    NO GROWTH 3 DAYS Performed at [facility] [address] [place], [place] [address]    Report Status PENDING  Incomplete  Urine Culture     Status: None   Collection Time: 09/15/19  8:03 AM   Specimen: Urine, Random  Result Value Ref Range Status   Specimen Description URINE, RANDOM  Final   Special Requests NONE  Final   Culture   Final    NO GROWTH Performed at [facility], [place] [address], [place], [place] [address]    Report Status 09/16/2019 FINAL  Final     Terri Piedra, NP [facility]  09/16/2019  11:56 AM

## 2019-09-16 NOTE — Progress Notes (Signed)
PROGRESS NOTE    Gina [name]  AVW:[id number] DOB: [birth year] DOA: 10/10/2019 PCP: [name], MD   Brief Narrative: 59 year old with past medical history significant for perforated prepyloric ulcer in 2020 status post exploratory laparotomy with [name] patch, chronic TPN via right arm PICC line, insulin-dependent diabetes, history off bilateral lower quadrant drains along with enterocutaneous fistula with colostomy bag, chronic sacral pressure ulcer stage II, schizophrenia, bilateral BKA who presents for evaluation of fever.  She has been admitted a few times this year for sepsis including MRSA bacteremia, fungemia, Candida peritonitis in April, Proteus UTI, Candida glabrata who completed Eraxis 6/26.  Presents with persistent fevers and low hemoglobin. Patient report that she was sent to the ED due to low hemoglobin.  Patient was sent from PCP office due to hypotension systolic blood pressure in the 60s, patient had an episode of nausea and vomiting.  In the ED her hemoglobin was stable around 7.7, hyponatremia, hyperglycemia.  Fecal occult was noted to be positive.  SIRS coronavirus 2 -.    Assessment & Plan:   Principal Problem:   Acute GI bleeding Active Problems:   Hyperglycemia   HTN (hypertension)   Diabetes (HCC)   Severe protein-calorie malnutrition (HCC)   Anemia of chronic disease   Sacral decubitus ulcer   Perforated gastric ulcer (HCC)   S/P BKA (below knee amputation) bilateral (HCC)   Enterocutaneous fistula   Iron deficiency anemia   Hypoalbuminemia   Anemia   Fever; unclear source: -Repeated blood cultures, UA, urine culture. -ID consulted. Patient was started  Eraxis, received 2 days. Blood culture no growth , Eraxis stopped. Monitor for fever.  -Chest x-ray: No acute abnormality -CT Abdomen and pelvis:Similar extent but increased fullness of the chronic intra-abdominal collection associated with the known enterocutaneous fistula. 1 cm low-density in the  subcapsular central liver that is new from March 2021 and very similar to June 2021 CT, would expect more evolution if this were an  abscess. -Fevers persist, discussed with ID 7/05  they recommend surgery evaluation/IR for drainage sample intraabdominal collection.  Repeat  urine culture; No grow to date. Plan to check blood culture if she spikes fever.  Per ID continue to monitor off antibiotics.   Anemia: Iron deficiency  May be chronic low GI bleed. Hemoglobin is stable 7.7.  2 weeks ago was 8.5 Continue with IV Protonix. Discussed with Dr. Kinnie Feil, [facility] no further evaluation unless patient develop active GI bleed. She will need IV iron, when infection is controlled.  Hb trending down, no evidence of active bleeding. Continue to monitor.  Received IV iron 7/05.  History of candida glabrata fungemia;  Completed Eraxis. 6-26  Hypotension; BP normal range. Monitor.improved.   Diabetes type 2: Continue with a sliding scale insulin  Hypertension: Hold amlodipine.  Continue with Lopressor.  Sacral decubitus ulcer stage  IV sacrum present on admission.  Left LE stage II : Continue with wound care.   History of perforated gastric ulcer status post Grahan patch; enterocutaneous Fistula;  Continue with wound care.  Resume TPN.   Chronic Pain due to bilateral BKA: Continue with gabapentin and oxycodone.  Hyponatremia; Continue with IV fluids. Improved.  Leukocytosis; follow trend.   AKI; Urine retention overnight.  Renal US negative for hydronephrosis.  Urine culture; No growth to date.  Cr stable at 2, continue with IV fluids.   Oral thrush; nystatin ordered.   Hypoglycemia; change fluids to D 5.   Pressure Injury 05/22/19 Coccyx Bilateral Stage 4 -  Full thickness tissue loss with exposed bone, tendon or muscle. Per wound nurse, healing stage IV from prior admission (Active)  05/22/19 2104  Location: Coccyx  Location Orientation: Bilateral  Staging: Stage 4 - Full  thickness tissue loss with exposed bone, tendon or muscle.  Wound Description (Comments): Per wound nurse, healing stage IV from prior admission  Present on Admission: Yes     Pressure Injury 05/22/19 Knee Right;Distal Unstageable - Full thickness tissue loss in which the base of the injury is covered by slough (yellow, tan, gray, green or brown) and/or eschar (tan, brown or black) in the wound bed. Stump (Active)  05/22/19 2106  Location: Knee  Location Orientation: Right;Distal  Staging: Unstageable - Full thickness tissue loss in which the base of the injury is covered by slough (yellow, tan, gray, green or brown) and/or eschar (tan, brown or black) in the wound bed.  Wound Description (Comments): Stump  Present on Admission: Yes     Pressure Injury 05/22/19 Knee Left;Distal Unstageable - Full thickness tissue loss in which the base of the injury is covered by slough (yellow, tan, gray, green or brown) and/or eschar (tan, brown or black) in the wound bed. Stump (Active)  05/22/19 2108  Location: Knee  Location Orientation: Left;Distal  Staging: Unstageable - Full thickness tissue loss in which the base of the injury is covered by slough (yellow, tan, gray, green or brown) and/or eschar (tan, brown or black) in the wound bed.  Wound Description (Comments): Stump  Present on Admission: Yes     Pressure Injury 05/22/19 Thigh Left;Mid Stage 2 -  Partial thickness loss of dermis presenting as a shallow open injury with a red, pink wound bed without slough. (Active)  05/22/19 2109  Location: Thigh  Location Orientation: Left;Mid  Staging: Stage 2 -  Partial thickness loss of dermis presenting as a shallow open injury with a red, pink wound bed without slough.  Wound Description (Comments):   Present on Admission: Yes     Pressure Injury 07/22/19 Sacrum Mid Stage 3 -  Full thickness tissue loss. Subcutaneous fat may be visible but bone, tendon or muscle are NOT exposed. (Active)  07/22/19     Location: Sacrum  Location Orientation: Mid  Staging: Stage 3 -  Full thickness tissue loss. Subcutaneous fat may be visible but bone, tendon or muscle are NOT exposed.  Wound Description (Comments):   Present on Admission: Yes     Pressure Injury 07/22/19 Leg Left Stage 3 -  Full thickness tissue loss. Subcutaneous fat may be visible but bone, tendon or muscle are NOT exposed. (Active)  07/22/19   Location: Leg  Location Orientation: Left  Staging: Stage 3 -  Full thickness tissue loss. Subcutaneous fat may be visible but bone, tendon or muscle are NOT exposed.  Wound Description (Comments):   Present on Admission: Yes     Pressure Injury 07/22/19 Leg Right Stage 3 -  Full thickness tissue loss. Subcutaneous fat may be visible but bone, tendon or muscle are NOT exposed. (Active)  07/22/19   Location: Leg  Location Orientation: Right  Staging: Stage 3 -  Full thickness tissue loss. Subcutaneous fat may be visible but bone, tendon or muscle are NOT exposed.  Wound Description (Comments):   Present on Admission: Yes     Nutrition Problem: Moderate Malnutrition Etiology: chronic illness (EC fistula, perforated prepyloric ulcer on TPN)    Signs/Symptoms: percent weight loss, moderate fat depletion, moderate muscle depletion Percent weight loss: 9 % (  in 1 mo)    Interventions: Refer to RD note for recommendations  Estimated body mass index is 18.29 kg/m as calculated from the following:   Height as of this encounter: 5\' 2"  (1.575 m).   Weight as of this encounter: 45.4 kg.   DVT prophylaxis: SCD due to concern for low hb Code Status: Full code Family Communication: care discussed with patient and daughter 7/05 Disposition Plan:  Status is: inpatient.   Dispo: The patient is from: Home              Anticipated d/c is to: SNF              Anticipated d/c date is: 3 days              Patient currently is not medically stable to d/c. Undergoing evaluation for fever,  awaiting blood culture results.         Consultants:   ID  Surgery   Procedures:   none  Antimicrobials:  Eraxis  Subjective: She is more alert, denies pain   Objective: Vitals:   09/15/19 0438 09/15/19 1343 09/15/19 2217 09/16/19 1300  BP: (!) 115/53 116/67 (!) 160/65 (!) 153/70  Pulse: 88 67 90 74  Resp: 17 18 17 17   Temp: 99.5 F (37.5 C) 98.1 F (36.7 C) 98.8 F (37.1 C) 97.7 F (36.5 C)  TempSrc: Tympanic Oral Oral Oral  SpO2: 92% 94% 93%   Weight:      Height:        Intake/Output Summary (Last 24 hours) at 09/16/2019 1435 Last data filed at 09/16/2019 1039 Gross per 24 hour  Intake 3549.41 ml  Output 900 ml  Net 2649.41 ml   Filed Weights   09/24/2019 1521  Weight: 45.4 kg    Examination:  General exam: NAD Respiratory system: CTA Cardiovascular system: S 1, S 2 RRR Gastrointestinal system: BS present, soft , nt ententerocutaneous fistula with ostomy bag in place.  Central nervous system: alert Extremities: B/L BKA  Data Reviewed: I have personally reviewed following labs and imaging studies  CBC: Recent Labs  Lab 09/12/19 0903 09/12/19 0903 09/13/19 1047 09/13/19 1822 09/14/19 0416 09/15/19 0420 09/16/19 0846  WBC 9.9  --  7.2  --  12.2* 13.2* 11.9*  NEUTROABS  --   --   --   --   --  10.0*  --   HGB 8.0*   < > 6.6* 8.6* 8.5* 7.4* 7.0*  HCT 25.9*   < > 21.5* 27.4* 26.9* 23.3* 22.1*  MCV 93.8  --  93.9  --  94.7 95.9 94.4  PLT 363  --  344  --  356 331 353   < > = values in this interval not displayed.   Basic Metabolic Panel: Recent Labs  Lab 09/12/19 0443 09/13/19 1047 09/14/19 0416 09/15/19 0420 09/16/19 0415  NA 131* 137 135 137 133*  K 4.0 3.8 4.0 3.6 3.2*  CL 96* 105 105 110 105  CO2 27 24 22  19* 17*  GLUCOSE 106* 103* 170* 262* 256*  BUN 32* 29* 27* 28* 32*  CREATININE 1.01* 1.26* 1.54* 2.28* 2.12*  CALCIUM 8.3* 8.1* 8.1* 8.1* 7.9*  MG 1.9  --   --  1.5* 2.1  PHOS 3.7  --   --  3.7 3.3   GFR: Estimated  Creatinine Clearance: 20.7 mL/min (A) (by C-G formula based on SCr of 2.12 mg/dL (H)). Liver Function Tests: Recent Labs  Lab 09/25/2019 1531 09/12/19 0443  09/15/19 0420 09/16/19 0415  AST 14* 13* 11* 12*  ALT 10 9 9 8   ALKPHOS 83 73 73 82  BILITOT 0.5 0.6 0.7 0.6  PROT 7.5 6.9 6.2* 6.3*  ALBUMIN 1.9* 1.7* 1.5* 1.4*   No results for input(s): LIPASE, AMYLASE in the last 168 hours. No results for input(s): AMMONIA in the last 168 hours. Coagulation Profile: Recent Labs  Lab 09/12/19 0443  INR 1.2   Cardiac Enzymes: No results for input(s): CKTOTAL, CKMB, CKMBINDEX, TROPONINI in the last 168 hours. BNP (last 3 results) No results for input(s): PROBNP in the last 8760 hours. HbA1C: No results for input(s): HGBA1C in the last 72 hours. CBG: Recent Labs  Lab 09/15/19 1155 09/15/19 1638 09/15/19 2203 09/16/19 0811 09/16/19 1150  GLUCAP 127* 79 248* 250* 197*   Lipid Profile: Recent Labs    09/15/19 0420  TRIG 126   Thyroid Function Tests: No results for input(s): TSH, T4TOTAL, FREET4, T3FREE, THYROIDAB in the last 72 hours. Anemia Panel: No results for input(s): VITAMINB12, FOLATE, FERRITIN, TIBC, IRON, RETICCTPCT in the last 72 hours. Sepsis Labs: Recent Labs  Lab 09/12/19 0903 09/13/19 0752 09/14/19 0416  PROCALCITON 0.25 0.20 0.33    Recent Results (from the past 240 hour(s))  SARS Coronavirus 2 by RT PCR (hospital order, performed in [facility] hospital lab) Nasopharyngeal Nasopharyngeal Swab     Status: None   Collection Time: 10/08/2019 10:06 PM   Specimen: Nasopharyngeal Swab  Result Value Ref Range Status   SARS Coronavirus 2 NEGATIVE NEGATIVE Final    Comment: (NOTE) SARS-CoV-2 target nucleic acids are NOT DETECTED.  The SARS-CoV-2 RNA is generally detectable in upper and lower respiratory specimens during the acute phase of infection. The lowest concentration of SARS-CoV-2 viral copies this assay can detect is 250 copies / mL. A negative result  does not preclude SARS-CoV-2 infection and should not be used as the sole basis for treatment or other patient management decisions.  A negative result may occur with improper specimen collection / handling, submission of specimen other than nasopharyngeal swab, presence of viral mutation(s) within the areas targeted by this assay, and inadequate number of viral copies (<250 copies / mL). A negative result must be combined with clinical observations, patient history, and epidemiological information.  Fact Sheet for Patients:   StrictlyIdeas.no  Fact Sheet for Healthcare Providers: BankingDealers.co.za  This test is not yet approved or  cleared by the [place] FDA and has been authorized for detection and/or diagnosis of SARS-CoV-2 by FDA under an Emergency Use Authorization (EUA).  This EUA will remain in effect (meaning this test can be used) for the duration of the COVID-19 declaration under Section 564(b)(1) of the Act, 21 U.S.C. section 360bbb-3(b)(1), unless the authorization is terminated or revoked sooner.  Performed at [facility], [place] [address] [place], [place] [address]   Culture, blood (Routine X 2) w Reflex to ID Panel     Status: None (Preliminary result)   Collection Time: 09/12/19  4:05 AM   Specimen: BLOOD  Result Value Ref Range Status   Specimen Description BLOOD PICC LINE  Final   Special Requests   Final    BOTTLES DRAWN AEROBIC AND ANAEROBIC Blood Culture results may not be optimal due to an excessive volume of blood received in culture bottles   Culture   Final    NO GROWTH 4 DAYS Performed at [facility], [place] [address] [place], [place] [address]  Report Status PENDING  Incomplete  Urine Culture     Status: Abnormal   Collection Time: 09/12/19  8:36 AM   Specimen: Urine, Random  Result Value Ref Range Status   Specimen Description URINE, RANDOM  Final   Special Requests   Final     NONE Performed at [facility], [address] [place], [place] [address]    Culture MULTIPLE SPECIES PRESENT, SUGGEST RECOLLECTION (A)  Final   Report Status 09/13/2019 FINAL  Final  Culture, blood (Routine X 2) w Reflex to ID Panel     Status: None (Preliminary result)   Collection Time: 09/12/19 12:14 PM   Specimen: BLOOD  Result Value Ref Range Status   Specimen Description BLOOD LEFT ANTECUBITAL  Final   Special Requests   Final    BOTTLES DRAWN AEROBIC AND ANAEROBIC Blood Culture results may not be optimal due to an inadequate volume of blood received in culture bottles   Culture   Final    NO GROWTH 4 DAYS Performed at [facility], [place] [address], [place], [place] [address]    Report Status PENDING  Incomplete  Urine Culture     Status: None   Collection Time: 09/15/19  8:03 AM   Specimen: Urine, Random  Result Value Ref Range Status   Specimen Description URINE, RANDOM  Final   Special Requests NONE  Final   Culture   Final    NO GROWTH Performed at [facility], [place] [address], [place], [place] [address]    Report Status 09/16/2019 FINAL  Final         Radiology Studies: US RENAL  Result Date: 09/15/2019 CLINICAL DATA:  Acute renal insufficiency. EXAM: RENAL / URINARY TRACT ULTRASOUND COMPLETE COMPARISON:  None. FINDINGS: Right Kidney: Renal measurements: 12.2 x 5.9 x 5.9 cm = volume: 220 mL. Increased cortical echogenicity. No hydronephrosis or focal mass. Left Kidney: Renal measurements: 10.9 x 6.9 x 5.6 cm = volume: 219 mL. Increased cortical echogenicity. No evidence of hydronephrosis or focal mass. Bladder: Foley catheter is present within a decompressed bladder. Other: None. IMPRESSION: Normal size kidneys without hydronephrosis. Increased cortical echogenicity compatible with medical renal disease. Electronically Signed   By: Marin Olp M.D.   On: 09/15/2019 16:06        Scheduled Meds: . Chlorhexidine Gluconate Cloth  6 each Topical  Daily  . cholestyramine  4 g Oral BID  . collagenase   Topical Daily  . cyanocobalamin  1,000 mcg Intramuscular Once  . feeding supplement  1 Container Oral TID BM  . gabapentin  300 mg Oral BID  . insulin aspart  0-9 Units Subcutaneous Q4H  . metoprolol tartrate  50 mg Oral BID  . nystatin  5 mL Oral QID  . pantoprazole  40 mg Intravenous Q12H  . sodium chloride flush  10-40 mL Intracatheter Q12H   Continuous Infusions: . sodium chloride 100 mL/hr at 09/16/19 0834  . TPN ADULT (ION) 40 mL/hr at 09/15/19 1715  . TPN ADULT (ION)       LOS: 4 days    Time spent: 35 minutes.     Elmarie Shiley, MD [facility]   If 7PM-7AM, please contact night-coverage [email or web address]  09/16/2019, 2:35 PM

## 2019-09-16 NOTE — Progress Notes (Signed)
Occupational Therapy Treatment Patient Details Name: Gina [name] MRN: [id number] DOB: 14-Apr-1960 Today's Date: 09/16/2019    History of present illness Pt is a 59 year old with past medical history significant for perforated prepyloric ulcer in 2020 status post exploratory laparotomy with [name] patch, chronic TPN via right arm PICC line, insulin-dependent diabetes, history off bilateral lower quadrant drains along with enterocutaneous fistula with colostomy bag, chronic sacral pressure ulcer stage II, schizophrenia, bilateral BKA who presents for evaluation of fever of unclear source. Also with anemia thought to be due to chronic GI bleed.    OT comments  This 59 yo female admitted with above presents to acute OT with ability to A with rolling in bed with increased time and cues (min guard A left and right). PTA she was doing her basic ADLs at bed level with her report of her dtr doing them for her and that she wore a diaper all the time. Pt definitely has potential to do more and feel she would benefit from continued therapy at SNF.  Follow Up Recommendations  SNF    Equipment Recommendations  None recommended by OT       Precautions / Restrictions Precautions Precautions: Fall Precaution Comments: colostomy, bilateral BKA Restrictions Weight Bearing Restrictions: No       Mobility Bed Mobility Overal bed mobility: Needs Assistance Bed Mobility: Rolling Rolling: Min guard         General bed mobility comments: VCs for sequencing with rolling left and right with increassed time; would not agree to keep working on this or sitting up on EOB                Vision Patient Visual Report: No change from baseline            Cognition Arousal/Alertness: Awake/alert Behavior During Therapy: WFL for tasks assessed/performed Overall Cognitive Status: History of cognitive impairments - at baseline Area of Impairment: Attention;Following commands;Problem solving                    Current Attention Level: Selective   Following Commands: Follows one step commands consistently;Follows one step commands inconsistently     Problem Solving: Slow processing;Requires verbal cues;Requires tactile cues                     Pertinent Vitals/ Pain       Pain Assessment: Faces Faces Pain Scale: Hurts little more Pain Location: sacral area (in bed--air mattress) Pain Descriptors / Indicators: Sore Pain Intervention(s): Limited activity within patient's tolerance;Monitored during session;Repositioned         Frequency  Min 2X/week        Progress Toward Goals  OT Goals(current goals can now be found in the care plan section)  Progress towards OT goals: Goals drowngraded-see care plan  Acute Rehab OT Goals Patient Stated Goal: to go home OT Goal Formulation: With patient Time For Goal Achievement: 09/27/19 Potential to Achieve Goals: Fair  Plan         AM-PAC OT "6 Clicks" Daily Activity     Outcome Measure   Help from another person eating meals?: A Lot Help from another person taking care of personal grooming?: A Lot Help from another person toileting, which includes using toliet, bedpan, or urinal?: A Lot (can A in rollign) Help from another person bathing (including washing, rinsing, drying)?: A Lot Help from another person to put on and taking off regular upper body clothing?: A Lot Help from another person  to put on and taking off regular lower body clothing?: A Lot (Can A wtih rolling) 6 Click Score: 12    End of Session    OT Visit Diagnosis: Other abnormalities of gait and mobility (R26.89);Muscle weakness (generalized) (M62.81);Pain Pain - part of body:  (bottom)   Activity Tolerance Patient tolerated treatment well   Patient Left in bed;with call bell/phone within reach   Nurse Communication  (pt has a couple of old scar wounds on inner thighs that were quite read when patient pulled her legs apart, showed them to RN  and encouraged pt to not rest her legs together)        Time: 9381-0175 OT Time Calculation (min): 22 min  Charges: OT General Charges $OT Visit: 1 Visit OT Treatments $Therapeutic Activity: 8-22 mins  Golden Circle, OTR/L Acute [employer] Pager [phone] Office [phone]      Almon Register 09/16/2019, 1:32 PM

## 2019-09-16 NOTE — Social Work (Addendum)
To Whom it May Concern, Please be advised that the above named patient will require a short term nursing home stay. Anticipated to be 30 days or less for rehabilitation and strengthening. The plan is for return home.

## 2019-09-16 NOTE — Plan of Care (Signed)
  Problem: Pain Managment: Goal: General experience of comfort will improve Outcome: Progressing   Problem: Safety: Goal: Ability to remain free from injury will improve Outcome: Progressing   

## 2019-09-16 NOTE — Consult Note (Signed)
WOC Nurse Consult Note: Patient receiving care in [place]. Eakin supplies in room for staff use when needed.  Pouch not currently needing changing per night shift RN.  Currently connected to bedside drainage bag. Please see orders for supplies for fistula care. Thank you for the consult.  Discussed plan of care with the bedside nurse. [facility] nurse will not follow at this time.  Please re-consult the [facility] team if needed.  Val Riles, RN, MSN, CWOCN, CNS-BC, pager [phone]

## 2019-09-17 DIAGNOSIS — L8995 Pressure ulcer of unspecified site, unstageable: Secondary | ICD-10-CM | POA: Diagnosis present

## 2019-09-17 DIAGNOSIS — I739 Peripheral vascular disease, unspecified: Secondary | ICD-10-CM | POA: Diagnosis present

## 2019-09-17 DIAGNOSIS — L8993 Pressure ulcer of unspecified site, stage 3: Secondary | ICD-10-CM | POA: Diagnosis present

## 2019-09-17 DIAGNOSIS — E162 Hypoglycemia, unspecified: Secondary | ICD-10-CM | POA: Diagnosis not present

## 2019-09-17 DIAGNOSIS — R338 Other retention of urine: Secondary | ICD-10-CM | POA: Diagnosis not present

## 2019-09-17 DIAGNOSIS — I959 Hypotension, unspecified: Secondary | ICD-10-CM | POA: Diagnosis present

## 2019-09-17 DIAGNOSIS — Z789 Other specified health status: Secondary | ICD-10-CM | POA: Diagnosis present

## 2019-09-17 DIAGNOSIS — R809 Proteinuria, unspecified: Secondary | ICD-10-CM | POA: Diagnosis present

## 2019-09-17 DIAGNOSIS — L89202 Pressure ulcer of unspecified hip, stage 2: Secondary | ICD-10-CM

## 2019-09-17 DIAGNOSIS — G894 Chronic pain syndrome: Secondary | ICD-10-CM | POA: Diagnosis present

## 2019-09-17 DIAGNOSIS — R636 Underweight: Secondary | ICD-10-CM | POA: Diagnosis present

## 2019-09-17 DIAGNOSIS — K279 Peptic ulcer, site unspecified, unspecified as acute or chronic, without hemorrhage or perforation: Secondary | ICD-10-CM | POA: Diagnosis present

## 2019-09-17 DIAGNOSIS — B49 Unspecified mycosis: Secondary | ICD-10-CM | POA: Insufficient documentation

## 2019-09-17 DIAGNOSIS — L8994 Pressure ulcer of unspecified site, stage 4: Secondary | ICD-10-CM | POA: Diagnosis present

## 2019-09-17 DIAGNOSIS — E871 Hypo-osmolality and hyponatremia: Secondary | ICD-10-CM | POA: Diagnosis present

## 2019-09-17 DIAGNOSIS — Z5189 Encounter for other specified aftercare: Secondary | ICD-10-CM

## 2019-09-17 LAB — GLUCOSE, CAPILLARY
Glucose-Capillary: 105 mg/dL — ABNORMAL HIGH (ref 70–99)
Glucose-Capillary: 121 mg/dL — ABNORMAL HIGH (ref 70–99)
Glucose-Capillary: 124 mg/dL — ABNORMAL HIGH (ref 70–99)
Glucose-Capillary: 160 mg/dL — ABNORMAL HIGH (ref 70–99)
Glucose-Capillary: 187 mg/dL — ABNORMAL HIGH (ref 70–99)
Glucose-Capillary: 98 mg/dL (ref 70–99)

## 2019-09-17 LAB — MAGNESIUM: Magnesium: 1.6 mg/dL — ABNORMAL LOW (ref 1.7–2.4)

## 2019-09-17 LAB — BASIC METABOLIC PANEL
Anion gap: 8 (ref 5–15)
BUN: 35 mg/dL — ABNORMAL HIGH (ref 6–20)
CO2: 18 mmol/L — ABNORMAL LOW (ref 22–32)
Calcium: 7.8 mg/dL — ABNORMAL LOW (ref 8.9–10.3)
Chloride: 106 mmol/L (ref 98–111)
Creatinine, Ser: 1.45 mg/dL — ABNORMAL HIGH (ref 0.44–1.00)
GFR calc Af Amer: 46 mL/min — ABNORMAL LOW (ref >60)
GFR calc non Af Amer: 40 mL/min — ABNORMAL LOW (ref >60)
Glucose, Bld: 133 mg/dL — ABNORMAL HIGH (ref 70–99)
Potassium: 3.5 mmol/L (ref 3.5–5.1)
Sodium: 132 mmol/L — ABNORMAL LOW (ref 135–145)

## 2019-09-17 LAB — CULTURE, BLOOD (ROUTINE X 2)
Culture: NO GROWTH
Culture: NO GROWTH

## 2019-09-17 LAB — PHOSPHORUS: Phosphorus: 2.2 mg/dL — ABNORMAL LOW (ref 2.5–4.6)

## 2019-09-17 MED ORDER — SODIUM CHLORIDE 0.9 % IV SOLN: INTRAVENOUS | Status: AC

## 2019-09-17 MED ORDER — POTASSIUM PHOSPHATES 15 MMOLE/5ML IV SOLN
10.0000 mmol | Freq: Once | INTRAVENOUS | Status: AC
  Administered 2019-09-17: 10 mmol via INTRAVENOUS
  Filled 2019-09-17: qty 3.33

## 2019-09-17 MED ORDER — MAGNESIUM SULFATE 2 GM/50ML IV SOLN
2.0000 g | Freq: Once | INTRAVENOUS | Status: AC
  Administered 2019-09-17: 2 g via INTRAVENOUS
  Filled 2019-09-17: qty 50

## 2019-09-17 MED ORDER — TRACE MINERALS CU-MN-SE-ZN 300-55-60-3000 MCG/ML IV SOLN
INTRAVENOUS | Status: AC
  Filled 2019-09-17: qty 520

## 2019-09-17 NOTE — Progress Notes (Signed)
Nutrition Follow-up  DOCUMENTATION CODES:   Non-severe (moderate) malnutrition in context of chronic illness  INTERVENTION:   -TPN management per pharmacy -Continue Boost Breeze po TID, each supplement provides 250 kcal and 9 grams of protein -Continue PO diet for comfort  NUTRITION DIAGNOSIS:   Moderate Malnutrition related to chronic illness (EC fistula, perforated prepyloric ulcer on TPN) as evidenced by percent weight loss, moderate fat depletion, moderate muscle depletion.  Ongoing  GOAL:   Patient will meet greater than or equal to 90% of their needs  Progressing   MONITOR:   PO intake, Supplement acceptance, Skin, Weight trends, Labs, I & O's  REASON FOR ASSESSMENT:   Consult Assessment of nutrition requirement/status  ASSESSMENT:   59 y.o. female with history of perforated prepyloric ulcer in 2020 s/p  exploratory laparotomy with [name] patch, chronic TPN via right arm PICC, IDDM, bilateral lower quadrant drains along with enterocutaneous fistula with colostomy bag, Chronic sacral pressure ulcers stage II, HTN, schizophrenia, bilateral BKA, PVD s/p B/L BKA presents with fever and low hemoglobin. Fecal occult blood was noted to be positive.  7/5- TPN re-started  Reviewed I/O's: -793 ml x 24 hours and +3.6 L since admission  UOP: 1.2 L x 24 hours  Pt continues to consume a PO diet for comfort (noted meal completion 25-50%). She is intermittently consuming Boost Breeze supplements. Due to [facility] fistula with eakin pouch, it is difficult to determine how much pt is absorbing via PO route.   Pt receiving TPN for nutritional support. Pt currently receiving TPN at 40 ml/hr, which provides 786 kcals and 48 grams protein, meeting 49% of estimated kcal needs and 64% of estimated protein needs. Per pharmacy note, plan to increase TPN to 65 ml/hr at 1800, which provides 1278 kcals and 78 grams protein, meeting 80% of estimated kcal needs and 100% of protein needs. Titration has  been slow secondary to hyperglycemia.   Per TOC notes, plan to d/c to SNF at discharge. However, placement will be challenging due to chronic TPN and eakin pouch.   Labs reviewed: CBGS: 118-152 (inpatient orders for glycemic control are 0-9 units insulin aspart every 4 hours).   Diet Order:   Diet Order            Diet heart healthy/carb modified Room service appropriate? Yes; Fluid consistency: Thin  Diet effective now                 EDUCATION NEEDS:   Not appropriate for education at this time  Skin:  Skin Assessment: Skin Integrity Issues: Skin Integrity Issues:: Stage IV Stage IV: bilateral coccyx  Last BM:  09/16/19 (eakin pouch for EC fistula)  Height:   Ht Readings from Last 1 Encounters:  10/08/2019 5\' 2"  (1.575 m)    Weight:   Wt Readings from Last 1 Encounters:  10/11/2019 45.4 kg    Ideal Body Weight:  43.5 kg (adjusted for bilateral BKAs)  BMI:  Body mass index is 18.29 kg/m.  Estimated Nutritional Needs:   Kcal:  1600-1800  Protein:  75-90 grams  Fluid:  > 1.6 L    Loistine Chance, RD, LDN, [place] Registered Dietitian II Certified Diabetes Care and Education Specialist Please refer to [facility] for RD and/or RD on-call/weekend/after hours pager

## 2019-09-17 NOTE — Consult Note (Signed)
Palliative Care  Consultation Reason: Goals of Care  59 yo woman with chronic critical illness following perforated ulcer in 2020 who remains TPN dependent and has multiple enterocuteaneous fistuals and eakins pouch- this all complicated by pre-existing schizophrenia, severe PVD with bilateral leg amputations and poorly controlled diabetes. She is well known to the palliative care service.  Today Gina [name] is not lethargic, not very talkative, but appropriate and oriented to person and situation-her judgement and insight seem to be intact. We discussed the challenges of providing her with care and she expresses understanding that she needs to be in a care facility.   I also spoke with her directly about her code status- her reason for changing her mind was because she was told she would not "receive treatment" but could not give me any other details or information. I re-addressed her code status and she clearly aligns with DNR/DNI in the event of cardiac or respiratory arrest but wants full scope treatment otherwise.Will enter a LIMITED CODE STATUS order for hospitalization only to calm her fears about witholding treatment that would help her, but out of hospital her DNR will be active and she should have a GOLDEN ROD-she also has documents in [place].  For now issues seem to be related to complicated disposition issues-will follow for any additional palliative care needs. Code status updated.  Lane Hacker, DO Palliative Medicine   Time: 30 minutes Greater than 50%  of this time was spent counseling and coordinating care related to the above assessment and plan.'

## 2019-09-17 NOTE — Progress Notes (Signed)
Gina [name] for Infectious Disease  Date of Admission:  09/19/2019     Total days of antibiotics 2         ASSESSMENT:  [name] has remained afebrile and without clear evidence or source of infection. Blood and urine cultures have remained without growth to date. Recommend continuing current care with no indications for antibiotics at the present time. If she has additional fevers would recommend checking blood cultures and give consideration to removal/replacement of central line. ID will sign off and be available as needed.   PLAN:  1. No further antibiotics are needed at the present time. 2. Monitor for fevers if they recur.  3. ID will sign off.   Principal Problem:   Acute GI bleeding Active Problems:   Hyperglycemia   HTN (hypertension)   Diabetes (HCC)   Severe protein-calorie malnutrition (HCC)   Anemia of chronic disease   Sacral decubitus ulcer   Perforated gastric ulcer (HCC)   S/P BKA (below knee amputation) bilateral (HCC)   Enterocutaneous fistula   Iron deficiency anemia   Hypoalbuminemia   Anemia   . Chlorhexidine Gluconate Cloth  6 each Topical Daily  . cholestyramine  4 g Oral BID  . collagenase   Topical Daily  . cyanocobalamin  1,000 mcg Intramuscular Once  . feeding supplement  1 Container Oral TID BM  . gabapentin  300 mg Oral BID  . insulin aspart  0-9 Units Subcutaneous Q4H  . metoprolol tartrate  50 mg Oral BID  . nystatin  5 mL Oral QID  . pantoprazole  40 mg Intravenous Q12H  . sodium chloride flush  10-40 mL Intracatheter Q12H    SUBJECTIVE:  Afebrile overnight with no acute events. Feeling okay today denies any fevers, chills or sweats.   Allergies  Allergen Reactions  . Bee Venom Swelling    Swells up with bee stings   . Metformin And Related Other (See Comments)    Upset stomach     Review of Systems: Review of Systems  Constitutional: Negative for chills, fever and weight loss.  Respiratory: Negative for cough,  shortness of breath and wheezing.   Cardiovascular: Negative for chest pain and leg swelling.  Gastrointestinal: Negative for abdominal pain, constipation, diarrhea, nausea and vomiting.  Skin: Negative for rash.      OBJECTIVE: Vitals:   09/15/19 2217 09/16/19 1300 09/16/19 2036 09/17/19 0405  BP: (!) 160/65 (!) 153/70 (!) 159/71 (!) 169/72  Pulse: 90 74 83 81  Resp: 17 17 17 17   Temp: 98.8 F (37.1 C) 97.7 F (36.5 C) 99 F (37.2 C) 99.9 F (37.7 C)  TempSrc: Oral Oral Tympanic Tympanic  SpO2: 93%  94% 100%  Weight:      Height:       Body mass index is 18.29 kg/m.  Physical Exam Constitutional:      General: She is not in acute distress.    Appearance: She is well-developed.     Comments: Lying in bed with head of bed elevated; sleeping on arrival and easily aroused.   Cardiovascular:     Rate and Rhythm: Normal rate and regular rhythm.     Heart sounds: Normal heart sounds.     Comments: Central line in right upper extremity with dressing is clean an dry. No evidence of infection.  Pulmonary:     Effort: Pulmonary effort is normal.     Breath sounds: Normal breath sounds.  Skin:    General: Skin is  warm and dry.  Neurological:     Mental Status: She is alert and oriented to person, place, and time.     Lab Results Lab Results  Component Value Date   WBC 11.9 (H) 09/16/2019   HGB 7.0 (L) 09/16/2019   HCT 22.1 (L) 09/16/2019   MCV 94.4 09/16/2019   PLT 353 09/16/2019    Lab Results  Component Value Date   CREATININE 1.45 (H) 09/17/2019   BUN 35 (H) 09/17/2019   NA 132 (L) 09/17/2019   K 3.5 09/17/2019   CL 106 09/17/2019   CO2 18 (L) 09/17/2019    Lab Results  Component Value Date   ALT 8 09/16/2019   AST 12 (L) 09/16/2019   ALKPHOS 82 09/16/2019   BILITOT 0.6 09/16/2019     Microbiology: Recent Results (from the past 240 hour(s))  SARS Coronavirus 2 by RT PCR (hospital order, performed in [place] hospital lab) Nasopharyngeal  Nasopharyngeal Swab     Status: None   Collection Time: 10/08/2019 10:06 PM   Specimen: Nasopharyngeal Swab  Result Value Ref Range Status   SARS Coronavirus 2 NEGATIVE NEGATIVE Final    Comment: (NOTE) SARS-CoV-2 target nucleic acids are NOT DETECTED.  The SARS-CoV-2 RNA is generally detectable in upper and lower respiratory specimens during the acute phase of infection. The lowest concentration of SARS-CoV-2 viral copies this assay can detect is 250 copies / mL. A negative result does not preclude SARS-CoV-2 infection and should not be used as the sole basis for treatment or other patient management decisions.  A negative result may occur with improper specimen collection / handling, submission of specimen other than nasopharyngeal swab, presence of viral mutation(s) within the areas targeted by this assay, and inadequate number of viral copies (<250 copies / mL). A negative result must be combined with clinical observations, patient history, and epidemiological information.  Fact Sheet for Patients:   StrictlyIdeas.no  Fact Sheet for Healthcare Providers: BankingDealers.co.za  This test is not yet approved or  cleared by the [place] FDA and has been authorized for detection and/or diagnosis of SARS-CoV-2 by FDA under an Emergency Use Authorization (EUA).  This EUA will remain in effect (meaning this test can be used) for the duration of the COVID-19 declaration under Section 564(b)(1) of the Act, 21 U.S.C. section 360bbb-3(b)(1), unless the authorization is terminated or revoked sooner.  Performed at [facility], [place] [address] [place], [place] [address]   Culture, blood (Routine X 2) w Reflex to ID Panel     Status: None (Preliminary result)   Collection Time: 09/12/19  4:05 AM   Specimen: BLOOD  Result Value Ref Range Status   Specimen Description BLOOD PICC LINE  Final   Special Requests   Final    BOTTLES  DRAWN AEROBIC AND ANAEROBIC Blood Culture results may not be optimal due to an excessive volume of blood received in culture bottles   Culture   Final    NO GROWTH 4 DAYS Performed at [facility], [place] [address], [place], [place] [address]    Report Status PENDING  Incomplete  Urine Culture     Status: Abnormal   Collection Time: 09/12/19  8:36 AM   Specimen: Urine, Random  Result Value Ref Range Status   Specimen Description URINE, RANDOM  Final   Special Requests   Final    NONE Performed at [facility], [place] [name], [place],  [address]  Culture MULTIPLE SPECIES PRESENT, SUGGEST RECOLLECTION (A)  Final   Report Status 09/13/2019 FINAL  Final  Culture, blood (Routine X 2) w Reflex to ID Panel     Status: None (Preliminary result)   Collection Time: 09/12/19 12:14 PM   Specimen: BLOOD  Result Value Ref Range Status   Specimen Description BLOOD LEFT ANTECUBITAL  Final   Special Requests   Final    BOTTLES DRAWN AEROBIC AND ANAEROBIC Blood Culture results may not be optimal due to an inadequate volume of blood received in culture bottles   Culture   Final    NO GROWTH 4 DAYS Performed at [facility], [place] [address], [place], [place] [address]    Report Status PENDING  Incomplete  Urine Culture     Status: None   Collection Time: 09/15/19  8:03 AM   Specimen: Urine, Random  Result Value Ref Range Status   Specimen Description URINE, RANDOM  Final   Special Requests NONE  Final   Culture   Final    NO GROWTH Performed at [facility], [place] [address] [place], [place] [address]    Report Status 09/16/2019 FINAL  Final     Terri Piedra, NP [place] for Infectious Disease [facility]  09/17/2019  9:38 AM

## 2019-09-17 NOTE — Progress Notes (Addendum)
PHARMACY - TOTAL PARENTERAL NUTRITION CONSULT NOTE  Indication:  EC fistula  Patient Measurements: Height: 5\' 2"  (157.5 cm) Weight: 45.4 kg (100 lb) IBW/kg (Calculated) : 50.1 TPN AdjBW (KG): 45.4 Body mass index is 18.29 kg/m. Usual Weight: 53-60 kg  Assessment:  44 YOF presents on 09/13/2019 with anemia.  Patient has a history of perforated prepyloric ulcer s/p [place].  She has an Chief Strategy Officer in place from January 2021 due to [facility] fistula and has been on TPN since 02/2019.  She was recently admitted with candidemia, resulting in PICC replacement.  Pharmacy consulted to continue TPN.  Patient reports not missing any TPN bags PTA. Per further discussion with MD on 7/2, held off on starting TPN initially due to fever and recent fungemia. TPN resumed 7/5. ID monitoring off antibiotics for now.  Glucose / Insulin: hx DM controlled by diet outpatient. Required insulin gtt last admit. CBGs variable off TPN, 79-262. Now controlled 120-150s. Utilized 6 units SSI / 24 hrs (decreased) Electrolytes: Na 132, K 3.5, CO2 down to 18, Mag 1.6, phos 2.2, others WNL Renal: AKI - SCr back down to 1.45 (0.9 on 7/1; BL 0.6-0.8), BUN up to 35; UOP up 0.3>1.1 ml/kg/hr LFTs / TGs: LFTs / Tbili / TG WNL Prealbumin / albumin: Prealbumin down to <5, albumin 1.4 Intake / Output; MIVF: NS at 100 ml/hr GI Imaging: none since admit Surgeries / Procedures: none since admit  Central access: PICC removed 6/12; replaced 6/16 TPN start date: chronic TPN  Nutritional Goals (per [facility] staff on 7/2): kCal: 1472 kCal, Protein: 90g, CHO 180g, ILE 50g, no insulin Infuse 1800 mL over 14 hrs  RD goals on 7/2: 1500-1700 kCal, 65-80g protein, >/= 1.5L fluid per day  Current Nutrition:  HH/CMD Boost TID between meals - refused 1 dose 7/6  Plan:  Increase TPN to 65 mL/hr at 1800 with improved CBG control. Titrate to goal and transition back to cyclic TPN as appropriate. This TPN provides 78 g AA, 156 g CHO, 44 g  ILE meeting ~80% goals Electrolytes in TPN: Increase Na/Mg/phos relative to rate inc, keep K at current, Ca removed. Cont max acetate Add standard MVI and trace elements to TPN Continue SSI sensitive q4h SSI and adjust as needed. 10 mmol KPhos IV x 1 Mg 2g IV x 1 Decrease NS to 75 ml/hr with TPN rate increase @1800  TPN labs in AM   Bertis Ruddy, PharmD Clinical Pharmacist Please check AMION for all [place] numbers 09/17/2019 7:31 AM

## 2019-09-17 NOTE — Progress Notes (Signed)
Physical Therapy Treatment Patient Details Name: Gina [name] MRN: [id number] DOB: [birth year] Today's Date: 09/17/2019    History of Present Illness Pt is a 59 year old with past medical history significant for perforated prepyloric ulcer in 2020 status post exploratory laparotomy with [name] patch, chronic TPN via right arm PICC line, insulin-dependent diabetes, history off bilateral lower quadrant drains along with enterocutaneous fistula with colostomy bag, chronic sacral pressure ulcer stage II, schizophrenia, bilateral BKA who presents for evaluation of fever of unclear source. Also with anemia thought to be due to chronic GI bleed.     PT Comments    Pt supine in bed on arrival and required max cues for encouragement to participate in PT session.  She was agreeable to sit edge of bed and perform LE/UE exercises but session was very limited.  Pt adamantly refused OOB to recliner but reports she wants to try tomorrow.  Pt continues to require +2 to mobilize to edge of bed and return back to bed.  SNF placement remains appropriate to continue to maximize functional gains.     Follow Up Recommendations  SNF     Equipment Recommendations  None recommended by PT    Recommendations for Other Services       Precautions / Restrictions Precautions Precautions: Fall Precaution Comments: colostomy, bilateral BKA Restrictions Other Position/Activity Restrictions: bilateral BKA    Mobility  Bed Mobility Overal bed mobility: Needs Assistance Bed Mobility: Rolling;Supine to Sit;Sit to Supine Rolling: Mod assist;+2 for physical assistance   Supine to sit: Max assist;+2 for physical assistance Sit to supine: Mod assist;+2 for physical assistance   General bed mobility comments: Pt with poor grip strength in R hand, required multimodal cues to roll into L sidelying and max +2 to rise into a seated position.  Pt presents with posterior bias in sitting.  Pt more participatory returning back  to bed.  +2 total to scoot with bed pad to HOB.  Transfers Overall transfer level:  (Pt refused OOB this session despite cues for encouragement.)                  Ambulation/Gait                 Stairs             Wheelchair Mobility    Modified Rankin (Stroke Patients Only)       Balance Overall balance assessment: Needs assistance Sitting-balance support: Bilateral upper extremity supported Sitting balance-Leahy Scale: Poor Sitting balance - Comments: reliant on BUE support and supervision Postural control: Posterior lean   Standing balance-Leahy Scale:  (unable B BKA)                              Cognition Arousal/Alertness: Awake/alert Behavior During Therapy: WFL for tasks assessed/performed Overall Cognitive Status: History of cognitive impairments - at baseline                                 General Comments: Pt speaking non-sensically at times.  Continues to require max encouragement to participate with PT during session.  Pt follows commands 50% of the time.      Exercises Other Exercises Other Exercises: Core rotation with external assistance to R and L x 10 reps in seated position. Other Exercises: shoulder flexion to 90 degrees as she was unable to go further due to pain x  10 reps AAROM B UEs in seated position. Other Exercises: B elbow flexion and extension AAROM x 10 reps in seated position. Other Exercises: LAQs B LEs in seated x 5 reps AROM.    General Comments        Pertinent Vitals/Pain Pain Assessment: Faces Faces Pain Scale: Hurts little more Pain Location: L knee with movement, also reports generalized pain with movement. Pain Descriptors / Indicators: Sore;Grimacing Pain Intervention(s): Repositioned;Monitored during session    Home Living                      Prior Function            PT Goals (current goals can now be found in the care plan section) Acute Rehab PT  Goals Patient Stated Goal: To go back to bed. Potential to Achieve Goals: Poor Progress towards PT goals: Not progressing toward goals - comment (Pt self limiting.)    Frequency    Min 2X/week      PT Plan Current plan remains appropriate    Co-evaluation              AM-PAC PT "6 Clicks" Mobility   Outcome Measure  Help needed turning from your back to your side while in a flat bed without using bedrails?: A Lot Help needed moving from lying on your back to sitting on the side of a flat bed without using bedrails?: Total Help needed moving to and from a bed to a chair (including a wheelchair)?: Total Help needed standing up from a chair using your arms (e.g., wheelchair or bedside chair)?: Total Help needed to walk in hospital room?: Total Help needed climbing 3-5 steps with a railing? : Total 6 Click Score: 7    End of Session   Activity Tolerance: Patient limited by lethargy;Patient limited by pain Patient left: in bed;with call bell/phone within reach Nurse Communication: Mobility status PT Visit Diagnosis: Other abnormalities of gait and mobility (R26.89);Muscle weakness (generalized) (M62.81)     Time: 1062-6948 PT Time Calculation (min) (ACUTE ONLY): 15 min  Charges:  $Therapeutic Activity: 8-22 mins                     [name] , PTA Acute Rehabilitation Services Pager [phone] Office [phone]     Toby Breithaupt [name] 09/17/2019, 3:21 PM

## 2019-09-17 NOTE — Progress Notes (Signed)
PROGRESS NOTE    Gina [name]  JSH:[id number] DOB: 27-Mar-1960 DOA: 09/30/2019 PCP: [name], MD   Brief Narrative: 59 year old with past medical history significant for perforated prepyloric ulcer in 2020 status post exploratory laparotomy with [name] patch, chronic TPN via right arm PICC line, insulin-dependent diabetes, history off bilateral lower quadrant drains along with enterocutaneous fistula with colostomy bag, chronic sacral pressure ulcer stage II, schizophrenia, bilateral BKA who presents for evaluation of fever.  She has been admitted a few times this year for sepsis including MRSA bacteremia, fungemia, Candida peritonitis in April, Proteus UTI, Candida glabrata who completed Eraxis 6/26.  Presents with persistent fevers and low hemoglobin. Patient report that she was sent to the ED due to low hemoglobin.  Patient was sent from PCP office due to hypotension systolic blood pressure in the 60s, patient had an episode of nausea and vomiting. In the ED her hemoglobin was stable around 7.7, hyponatremia, hyperglycemia.  Fecal occult was noted to be positive.    Assessment & Plan:   Principal Problem:   Acute GI bleeding Active Problems:   Hyperglycemia (glucose 203 on admission)   HTN (hypertension)   Type 2 diabetes mellitus with other circulatory complications (HCC)   Severe protein-calorie malnutrition (HCC)   Anemia of chronic disease   Schizophrenia (HCC)   Sacral decubitus ulcer, stage II   Below-knee amputee (HCC)   Intra-abdominal fluid collection, chronic   S/P BKA (below knee amputation) bilateral (HCC)   Enterocutaneous fistula   Fever   Iron deficiency anemia due to chronic blood loss   Hypoalbuminemia   On total parenteral nutrition (TPN)   PVD (peripheral vascular disease) (HCC) s/p bilateral BKAs   Hypotension   Hyponatremia (Sodium 131 on admission)   PUD (peptic ulcer disease) with history of perforated gastric ulcer s/p [name] patch   Chronic pain  syndrome   Proteinuria   Hypomagnesemia   Hypophosphatemia   Stage 4 pressure ulcer ([place]) of bilateral coccyx   Pressure ulcer, unstageable ([place]) to right distal knee, left distal knee   Pressure ulcer of left mid thigh, stage 2 (HCC)   Pressure injury, stage 3 ([place]) to mid sacrum, left leg and right leg   Underweight   Encounter for blood transfusion on 09/13/19   Acute urinary retention   Hypoglycemia   Fever of unknown origin, POA -Repeated blood cultures, UA, urine culture. -ID consulted. Patient was started  Eraxis, received 2 days. Blood culture no growth , Eraxis stopped. Monitor for fever curve. -Chest x-ray: No acute abnormality -CT Abdomen and pelvis:Similar extent but increased fullness of the chronic intra-abdominal collection associated with the known enterocutaneous fistula. 1 cm low-density in the subcapsular central liver that is new from March 2021 and very similar to June 2021 CT, would expect more evolution if this were an  abscess. -Fevers persist, discussed with ID 7/05  they recommend surgery evaluation/IR for drainage sample intraabdominal collection.  Repeat  urine culture; No grow to date. Plan to check blood culture if she spikes fever.  Per ID continue to monitor off antibiotics.   Anemia: Iron deficiency, with likely concurrent GI bleed in the setting of hemoccult positive stool, POA May be chronic/slow GI bleed Baseline previously 8-9 CBC Latest Ref Rng & Units 09/16/2019 09/15/2019 09/14/2019  WBC 4.0 - 10.5 K/uL 11.9(H) 13.2(H) 12.2(H)  Hemoglobin 12.0 - 15.0 g/dL 7.0(L) 7.4(L) 8.5(L)  Hematocrit 36 - 46 % 22.1(L) 23.3(L) 26.9(L)  Platelets 150 - 400 K/uL 353 331 356  Continue with IV Protonix. Discussed with Dr. Kinnie Feil, [facility] no further evaluation unless patient develop active GI bleed. She will need IV iron, when infection is controlled.  Hb trending down, no evidence of active bleeding at this time.  Continue to monitor - transfuse if Hgb<7 or  symptomatic Received IV iron 7/05.  History of candida glabrata fungemia;  Completed Eraxis. 6-26  Hypotension; BP normal range. Monitor.improved.   Diabetes type 2: Continue with a sliding scale insulin  Hypertension: Hold amlodipine.  Continue with Lopressor.  Sacral decubitus ulcer stage IV sacrum present on admission.  Left LE stage II : Continue with wound care.   History of perforated gastric ulcer status post Grahan patch; enterocutaneous Fistula;  Continue with wound care.  Resume TPN.   Chronic Pain due to bilateral BKA: Continue with gabapentin and oxycodone.  Hyponatremia; Continue with IV fluids. Improved.  Leukocytosis; follow trend.   AKI, resolving.  Previous issues of urinary retention - renal US negative for hydronephrosis.  Baseline creatinine around 0.8 Urine culture; No growth to date.  Lab Results  Component Value Date   CREATININE 1.45 (H) 09/17/2019   CREATININE 2.12 (H) 09/16/2019   CREATININE 2.28 (H) 09/15/2019   Oral thrush; nystatin ordered.   Hypoglycemia; change fluids to D 5.   Pressure Injury 05/22/19 Coccyx Bilateral Stage 4 - Full thickness tissue loss with exposed bone, tendon or muscle. Per wound nurse, healing stage IV from prior admission (Active)  05/22/19 2104  Location: Coccyx  Location Orientation: Bilateral  Staging: Stage 4 - Full thickness tissue loss with exposed bone, tendon or muscle.  Wound Description (Comments): Per wound nurse, healing stage IV from prior admission  Present on Admission: Yes     Pressure Injury 05/22/19 Knee Right;Distal Unstageable - Full thickness tissue loss in which the base of the injury is covered by slough (yellow, tan, gray, green or brown) and/or eschar (tan, brown or black) in the wound bed. Stump (Active)  05/22/19 2106  Location: Knee  Location Orientation: Right;Distal  Staging: Unstageable - Full thickness tissue loss in which the base of the injury is covered by slough (yellow, tan,  gray, green or brown) and/or eschar (tan, brown or black) in the wound bed.  Wound Description (Comments): Stump  Present on Admission: Yes     Pressure Injury 05/22/19 Knee Left;Distal Unstageable - Full thickness tissue loss in which the base of the injury is covered by slough (yellow, tan, gray, green or brown) and/or eschar (tan, brown or black) in the wound bed. Stump (Active)  05/22/19 2108  Location: Knee  Location Orientation: Left;Distal  Staging: Unstageable - Full thickness tissue loss in which the base of the injury is covered by slough (yellow, tan, gray, green or brown) and/or eschar (tan, brown or black) in the wound bed.  Wound Description (Comments): Stump  Present on Admission: Yes     Pressure Injury 05/22/19 Thigh Left;Mid Stage 2 -  Partial thickness loss of dermis presenting as a shallow open injury with a red, pink wound bed without slough. (Active)  05/22/19 2109  Location: Thigh  Location Orientation: Left;Mid  Staging: Stage 2 -  Partial thickness loss of dermis presenting as a shallow open injury with a red, pink wound bed without slough.  Wound Description (Comments):   Present on Admission: Yes     Pressure Injury 07/22/19 Sacrum Mid Stage 3 -  Full thickness tissue loss. Subcutaneous fat may be visible but bone, tendon or muscle are NOT  exposed. (Active)  07/22/19   Location: Sacrum  Location Orientation: Mid  Staging: Stage 3 -  Full thickness tissue loss. Subcutaneous fat may be visible but bone, tendon or muscle are NOT exposed.  Wound Description (Comments):   Present on Admission: Yes     Pressure Injury 07/22/19 Leg Left Stage 3 -  Full thickness tissue loss. Subcutaneous fat may be visible but bone, tendon or muscle are NOT exposed. (Active)  07/22/19   Location: Leg  Location Orientation: Left  Staging: Stage 3 -  Full thickness tissue loss. Subcutaneous fat may be visible but bone, tendon or muscle are NOT exposed.  Wound Description (Comments):    Present on Admission: Yes     Pressure Injury 07/22/19 Leg Right Stage 3 -  Full thickness tissue loss. Subcutaneous fat may be visible but bone, tendon or muscle are NOT exposed. (Active)  07/22/19   Location: Leg  Location Orientation: Right  Staging: Stage 3 -  Full thickness tissue loss. Subcutaneous fat may be visible but bone, tendon or muscle are NOT exposed.  Wound Description (Comments):   Present on Admission: Yes   Nutrition Problem: Moderate Malnutrition Etiology: chronic illness (EC fistula, perforated prepyloric ulcer on TPN)  Signs/Symptoms: percent weight loss, moderate fat depletion, moderate muscle depletion Percent weight loss: 9 % (in 1 mo)  Interventions: Refer to RD note for recommendations  Estimated body mass index is 18.29 kg/m as calculated from the following:   Height as of this encounter: 5\' 2"  (1.575 m).   Weight as of this encounter: 45.4 kg.   DVT prophylaxis: SCD due to concern for low hb and possible occult bleed Code Status: Full code Family Communication: care discussed with patient and daughter 7/05 Disposition Plan:  Status is: inpatient.   Dispo: The patient is from: Home              Anticipated d/c is to: SNF              Anticipated d/c date is: 3 days              Patient currently is not medically stable to d/c. Undergoing evaluation for fever, awaiting blood culture results with worsening anemia concerning for occult bleed.   Consultants:   ID  Surgery   Procedures:   none  Antimicrobials:  Eraxis - discontinued Nystatin oral  Subjective: No acute issues or events reported overnight, patient awake and more alert this morning, denies chest pain, shortness of breath, nausea, vomiting, diarrhea, constipation, headache, fevers, chills.  Her only complaint is that she is hungry and would like more food   Objective: Vitals:   09/16/19 1300 09/16/19 2036 09/17/19 0405 09/17/19 1555  BP: (!) 153/70 (!) 159/71 (!) 169/72 (!)  180/80  Pulse: 74 83 81 80  Resp: 17 17 17 17   Temp: 97.7 F (36.5 C) 99 F (37.2 C) 99.9 F (37.7 C) (!) 100.8 F (38.2 C)  TempSrc: Oral Tympanic Tympanic Oral  SpO2:  94% 100% 94%  Weight:      Height:        Intake/Output Summary (Last 24 hours) at 09/17/2019 1558 Last data filed at 09/17/2019 1141 Gross per 24 hour  Intake 477 ml  Output 950 ml  Net -473 ml   Filed Weights   09/16/2019 1521  Weight: 45.4 kg    Examination:  General exam: NAD Respiratory system: CTA Cardiovascular system: S 1, S 2 RRR Gastrointestinal system: BS present, soft , nt  ententerocutaneous fistula with ostomy bag in place.  Central nervous system: alert Extremities: B/L BKA  Data Reviewed: I have personally reviewed following labs and imaging studies  CBC: Recent Labs  Lab 09/12/19 0903 09/12/19 0903 09/13/19 1047 09/13/19 1822 09/14/19 0416 09/15/19 0420 09/16/19 0846  WBC 9.9  --  7.2  --  12.2* 13.2* 11.9*  NEUTROABS  --   --   --   --   --  10.0*  --   HGB 8.0*   < > 6.6* 8.6* 8.5* 7.4* 7.0*  HCT 25.9*   < > 21.5* 27.4* 26.9* 23.3* 22.1*  MCV 93.8  --  93.9  --  94.7 95.9 94.4  PLT 363  --  344  --  356 331 353   < > = values in this interval not displayed.   Basic Metabolic Panel: Recent Labs  Lab 09/12/19 0443 09/12/19 0443 09/13/19 1047 09/14/19 0416 09/15/19 0420 09/16/19 0415 09/17/19 0425  NA 131*   < > 137 135 137 133* 132*  K 4.0   < > 3.8 4.0 3.6 3.2* 3.5  CL 96*   < > 105 105 110 105 106  CO2 27   < > 24 22 19* 17* 18*  GLUCOSE 106*   < > 103* 170* 262* 256* 133*  BUN 32*   < > 29* 27* 28* 32* 35*  CREATININE 1.01*   < > 1.26* 1.54* 2.28* 2.12* 1.45*  CALCIUM 8.3*   < > 8.1* 8.1* 8.1* 7.9* 7.8*  MG 1.9  --   --   --  1.5* 2.1 1.6*  PHOS 3.7  --   --   --  3.7 3.3 2.2*   < > = values in this interval not displayed.   GFR: Estimated Creatinine Clearance: 30.3 mL/min (A) (by C-G formula based on SCr of 1.45 mg/dL (H)). Liver Function Tests: Recent Labs   Lab 09/15/2019 1531 09/12/19 0443 09/15/19 0420 09/16/19 0415  AST 14* 13* 11* 12*  ALT 10 9 9 8   ALKPHOS 83 73 73 82  BILITOT 0.5 0.6 0.7 0.6  PROT 7.5 6.9 6.2* 6.3*  ALBUMIN 1.9* 1.7* 1.5* 1.4*   No results for input(s): LIPASE, AMYLASE in the last 168 hours. No results for input(s): AMMONIA in the last 168 hours. Coagulation Profile: Recent Labs  Lab 09/12/19 0443  INR 1.2   Cardiac Enzymes: No results for input(s): CKTOTAL, CKMB, CKMBINDEX, TROPONINI in the last 168 hours. BNP (last 3 results) No results for input(s): PROBNP in the last 8760 hours. HbA1C: No results for input(s): HGBA1C in the last 72 hours. CBG: Recent Labs  Lab 09/16/19 2040 09/16/19 2351 09/17/19 0403 09/17/19 0743 09/17/19 1139  GLUCAP 150* 152* 121* 187* 160*   Lipid Profile: Recent Labs    09/15/19 0420  TRIG 126   Thyroid Function Tests: No results for input(s): TSH, T4TOTAL, FREET4, T3FREE, THYROIDAB in the last 72 hours. Anemia Panel: No results for input(s): VITAMINB12, FOLATE, FERRITIN, TIBC, IRON, RETICCTPCT in the last 72 hours. Sepsis Labs: Recent Labs  Lab 09/12/19 0903 09/13/19 0752 09/14/19 0416  PROCALCITON 0.25 0.20 0.33    Recent Results (from the past 240 hour(s))  SARS Coronavirus 2 by RT PCR (hospital order, performed in [facility] hospital lab) Nasopharyngeal Nasopharyngeal Swab     Status: None   Collection Time: 09/20/2019 10:06 PM   Specimen: Nasopharyngeal Swab  Result Value Ref Range Status   SARS Coronavirus 2 NEGATIVE NEGATIVE Final  Comment: (NOTE) SARS-CoV-2 target nucleic acids are NOT DETECTED.  The SARS-CoV-2 RNA is generally detectable in upper and lower respiratory specimens during the acute phase of infection. The lowest concentration of SARS-CoV-2 viral copies this assay can detect is 250 copies / mL. A negative result does not preclude SARS-CoV-2 infection and should not be used as the sole basis for treatment or other patient  management decisions.  A negative result may occur with improper specimen collection / handling, submission of specimen other than nasopharyngeal swab, presence of viral mutation(s) within the areas targeted by this assay, and inadequate number of viral copies (<250 copies / mL). A negative result must be combined with clinical observations, patient history, and epidemiological information.  Fact Sheet for Patients:   StrictlyIdeas.no  Fact Sheet for Healthcare Providers: BankingDealers.co.za  This test is not yet approved or  cleared by the [place] FDA and has been authorized for detection and/or diagnosis of SARS-CoV-2 by FDA under an Emergency Use Authorization (EUA).  This EUA will remain in effect (meaning this test can be used) for the duration of the COVID-19 declaration under Section 564(b)(1) of the Act, 21 U.S.C. section 360bbb-3(b)(1), unless the authorization is terminated or revoked sooner.  Performed at [facility], [place] [address], [place], [place] [address]   Culture, blood (Routine X 2) w Reflex to ID Panel     Status: None   Collection Time: 09/12/19  4:05 AM   Specimen: BLOOD  Result Value Ref Range Status   Specimen Description BLOOD PICC LINE  Final   Special Requests   Final    BOTTLES DRAWN AEROBIC AND ANAEROBIC Blood Culture results may not be optimal due to an excessive volume of blood received in culture bottles   Culture   Final    NO GROWTH 5 DAYS Performed at [facility], [place] [address], [place], [place] [address]    Report Status 09/17/2019 FINAL  Final  Urine Culture     Status: Abnormal   Collection Time: 09/12/19  8:36 AM   Specimen: Urine, Random  Result Value Ref Range Status   Specimen Description URINE, RANDOM  Final   Special Requests   Final    NONE Performed at [facility], [place] [address] [place], [place] [address]    Culture MULTIPLE SPECIES PRESENT, SUGGEST  RECOLLECTION (A)  Final   Report Status 09/13/2019 FINAL  Final  Culture, blood (Routine X 2) w Reflex to ID Panel     Status: None   Collection Time: 09/12/19 12:14 PM   Specimen: BLOOD  Result Value Ref Range Status   Specimen Description BLOOD LEFT ANTECUBITAL  Final   Special Requests   Final    BOTTLES DRAWN AEROBIC AND ANAEROBIC Blood Culture results may not be optimal due to an inadequate volume of blood received in culture bottles   Culture   Final    NO GROWTH 5 DAYS Performed at [facility], [place] [address], [place], [place] [address]    Report Status 09/17/2019 FINAL  Final  Urine Culture     Status: None   Collection Time: 09/15/19  8:03 AM   Specimen: Urine, Random  Result Value Ref Range Status   Specimen Description URINE, RANDOM  Final   Special Requests NONE  Final   Culture   Final    NO GROWTH Performed at [facility], [place] [address], [place], [place] [address]    Report Status  09/16/2019 FINAL  Final         Radiology Studies: No results found.      Scheduled Meds: . Chlorhexidine Gluconate Cloth  6 each Topical Daily  . cholestyramine  4 g Oral BID  . collagenase   Topical Daily  . cyanocobalamin  1,000 mcg Intramuscular Once  . feeding supplement  1 Container Oral TID BM  . gabapentin  300 mg Oral BID  . insulin aspart  0-9 Units Subcutaneous Q4H  . metoprolol tartrate  50 mg Oral BID  . nystatin  5 mL Oral QID  . pantoprazole  40 mg Intravenous Q12H  . sodium chloride flush  10-40 mL Intracatheter Q12H   Continuous Infusions: . sodium chloride 100 mL/hr at 09/17/19 0849  . sodium chloride    . magnesium sulfate bolus IVPB 2 g (09/17/19 1517)  . potassium PHOSPHATE IVPB (in mmol) 10 mmol (09/17/19 1000)  . TPN ADULT (ION) 40 mL/hr at 09/16/19 1751  . TPN ADULT (ION)      LOS: 5 days   Time spent: 35 minutes.   Little Ishikawa, DO [facility]  If 7PM-7AM, please contact  night-coverage [email or web address]  09/17/2019, 3:58 PM

## 2019-09-17 NOTE — Assessment & Plan Note (Signed)
Hgb 7.7 on admit

## 2019-09-17 NOTE — TOC Progression Note (Addendum)
Transition of Care [facility]) - Progression Note    Patient Details  Name: Gina [name] MRN: [id number] Date of Birth: [birth year]  Transition of Care [facility]) CM/SW Contact  Jacalyn Lefevre Edson Snowball, RN Phone Number: 09/17/2019, 1:41 PM  Clinical Narrative:     No SNF bed offers.   Sent messages to [facility] , Angela cannot meet patient's needs.   [name] cannot do TPN.   Nikki at [facility] accept patient.   Tonya  at [facility] cannot take TPN.    Lorrie at [facility] : TPN would need to be cyclic ( per notes that is the plan) , she will check to see if she can accept eakin pouch   Freda Munro at [facility] do TPN   Tammi at [facility] unable to accept due to [place] call back from , [place] at [facility] .  Olivia Mackie at [facility] accept due to TPN    Expected Discharge Plan: [place] Barriers to Discharge: Continued Medical Work up, Ship broker, Other (comment), Programmer, applications (PASRR) (TPN needed)  Expected Discharge Plan and Services Expected Discharge Plan: [place]       Living arrangements for the past 2 months: Apartment                                       Social Determinants of Health (SDOH) Interventions    Readmission Risk Interventions Readmission Risk Prevention Plan 09/16/2019 08/29/2019 08/19/2019  Post Dischage Appt - - -  Medication Screening - - -  Transportation Screening Complete Complete Complete  PCP follow-up - - -  PCP or Specialist Appt within 3-5 Days - - -  Not Complete comments - - -  HRI or [place] Work Consult for [place] Planning/Counseling - - -  SW consult not completed comments - - -  Palliative Care Screening - - -  Palliative Care Screening Not Complete Comments - - -  Medication Review ([place]) Referral to Pharmacy Complete Complete  PCP or Specialist appointment within 3-5 days of  discharge Not Complete Complete -  PCP/Specialist Appt Not Complete comments disposition pending - -  HRI or Home Care Consult Complete Complete Complete  SW Recovery Care/Counseling Consult Complete Complete Complete  Palliative Care Screening Complete Complete Not Applicable  Skilled Nursing Facility Complete Complete Not Applicable  Some recent data might be hidden

## 2019-09-18 LAB — COMPREHENSIVE METABOLIC PANEL
ALT: 12 U/L (ref 0–44)
AST: 14 U/L — ABNORMAL LOW (ref 15–41)
Albumin: 1.3 g/dL — ABNORMAL LOW (ref 3.5–5.0)
Alkaline Phosphatase: 93 U/L (ref 38–126)
Anion gap: 6 (ref 5–15)
BUN: 33 mg/dL — ABNORMAL HIGH (ref 6–20)
CO2: 20 mmol/L — ABNORMAL LOW (ref 22–32)
Calcium: 7.8 mg/dL — ABNORMAL LOW (ref 8.9–10.3)
Chloride: 107 mmol/L (ref 98–111)
Creatinine, Ser: 1.14 mg/dL — ABNORMAL HIGH (ref 0.44–1.00)
GFR calc Af Amer: >60 mL/min (ref >60)
GFR calc non Af Amer: 53 mL/min — ABNORMAL LOW (ref >60)
Glucose, Bld: 199 mg/dL — ABNORMAL HIGH (ref 70–99)
Potassium: 3.6 mmol/L (ref 3.5–5.1)
Sodium: 133 mmol/L — ABNORMAL LOW (ref 135–145)
Total Bilirubin: 0.1 mg/dL — ABNORMAL LOW (ref 0.3–1.2)
Total Protein: 6 g/dL — ABNORMAL LOW (ref 6.5–8.1)

## 2019-09-18 LAB — CBC
HCT: 20.9 % — ABNORMAL LOW (ref 36.0–46.0)
Hemoglobin: 6.6 g/dL — CL (ref 12.0–15.0)
MCH: 29.3 pg (ref 26.0–34.0)
MCHC: 31.6 g/dL (ref 30.0–36.0)
MCV: 92.9 fL (ref 80.0–100.0)
Platelets: 311 10*3/uL (ref 150–400)
RBC: 2.25 MIL/uL — ABNORMAL LOW (ref 3.87–5.11)
RDW: 15.2 % (ref 11.5–15.5)
WBC: 13.1 10*3/uL — ABNORMAL HIGH (ref 4.0–10.5)
nRBC: 0 % (ref 0.0–0.2)

## 2019-09-18 LAB — GLUCOSE, CAPILLARY
Glucose-Capillary: 182 mg/dL — ABNORMAL HIGH (ref 70–99)
Glucose-Capillary: 217 mg/dL — ABNORMAL HIGH (ref 70–99)
Glucose-Capillary: 224 mg/dL — ABNORMAL HIGH (ref 70–99)
Glucose-Capillary: 252 mg/dL — ABNORMAL HIGH (ref 70–99)
Glucose-Capillary: 153 mg/dL — ABNORMAL HIGH (ref 70–99)

## 2019-09-18 LAB — PHOSPHORUS: Phosphorus: 2.9 mg/dL (ref 2.5–4.6)

## 2019-09-18 LAB — MAGNESIUM: Magnesium: 2.1 mg/dL (ref 1.7–2.4)

## 2019-09-18 LAB — HEMOGLOBIN AND HEMATOCRIT, BLOOD
HCT: 27 % — ABNORMAL LOW (ref 36.0–46.0)
Hemoglobin: 8.7 g/dL — ABNORMAL LOW (ref 12.0–15.0)

## 2019-09-18 LAB — PREPARE RBC (CROSSMATCH)

## 2019-09-18 MED ORDER — SODIUM CHLORIDE 0.9 % IV SOLN: INTRAVENOUS | Status: DC

## 2019-09-18 MED ORDER — SODIUM CHLORIDE 0.9% IV SOLUTION: Freq: Once | INTRAVENOUS | Status: AC

## 2019-09-18 MED ORDER — TRACE MINERALS CU-MN-SE-ZN 300-55-60-3000 MCG/ML IV SOLN
INTRAVENOUS | Status: AC
  Filled 2019-09-18: qty 600

## 2019-09-18 NOTE — Progress Notes (Signed)
PROGRESS NOTE    Gina [name]  QPY:[id number] DOB: 10/23/1960 DOA: 10/10/2019 PCP: [name], MD    Brief Narrative:  59 year old with past medical history significant for perforated prepyloric ulcer in 2020 status post exploratory laparotomy with [name] patch, chronic TPN via right arm PICC line, insulin-dependent diabetes, history of bilateral lower quadrant drains along with enterocutaneous fistula with colostomy bag, chronic sacral pressure ulcer stage II, schizophrenia, bilateral BKA who presents for evaluation of fever.  She has been admitted a few times this year for sepsis including MRSA bacteremia, fungemia, Candida peritonitis in April, Proteus UTI, Candida glabrata who completed Eraxis 6/26.  Presents with persistent fevers and low hemoglobin. Patient reported that she was sent to the ED due to low hemoglobin.  Patient was sent from PCP office due to hypotension, systolic blood pressure in the 60s, patient had an episode of nausea and vomiting. In the ED her hemoglobin was stable around 7.7, hyponatremia, hyperglycemia.  Fecal occult was noted to be positive.   Assessment & Plan:   Principal Problem:   Acute GI bleeding Active Problems:   Hyperglycemia (glucose 203 on admission)   HTN (hypertension)   Type 2 diabetes mellitus with other circulatory complications (HCC)   Severe protein-calorie malnutrition (HCC)   Anemia of chronic disease   Schizophrenia (HCC)   Sacral decubitus ulcer, stage II   Below-knee amputee (HCC)   Intra-abdominal fluid collection, chronic   S/P BKA (below knee amputation) bilateral (HCC)   Enterocutaneous fistula   Fever   Iron deficiency anemia due to chronic blood loss   Hypoalbuminemia   On total parenteral nutrition (TPN)   PVD (peripheral vascular disease) (HCC) s/p bilateral BKAs   Hypotension   Hyponatremia (Sodium 131 on admission)   PUD (peptic ulcer disease) with history of perforated gastric ulcer s/p [name] patch   Chronic pain  syndrome   Proteinuria   Hypomagnesemia   Hypophosphatemia   Stage 4 pressure ulcer ([place]) of bilateral coccyx   Pressure ulcer, unstageable ([place]) to right distal knee, left distal knee   Pressure ulcer of left mid thigh, stage 2 (HCC)   Pressure injury, stage 3 ([place]) to mid sacrum, left leg and right leg   Underweight   Encounter for blood transfusion on 09/13/19   Acute urinary retention   Hypoglycemia  Fever of unknown origin, present on admission: Repeat blood cultures, urinalysis and urine cultures negative. Infectious disease consulted, patient was restarted on Eraxis that she received for 2 days and that was discontinued. No evidence of recurrent fever or localization of infection.  Monitoring off antibiotics.  Afebrile now for more than 72 hours. CT scan abdomen pelvis Chronic, chronic intra-abdominal collection, enterocutaneous fistula. Symptomatic treatment.  Anemia: Iron deficiency with likely acute on chronic anemia.  Hemoccult positive stool present on admission.  Suspect chronic GI bleeding. Hemoglobin 6.6 - 1 unit of PRBC-  8.5-7.4-7-6.6. 1 unit PRBC transfusion today 7/8, recheck to ensure stabilization. Continue IV Protonix, will change to p.o. Protonix on discharge. Previous provider discussed with Dr. Paulita Fujita with [facility] and they recommended symptomatic treatment. Received IV iron on 7/5.  History of Candida glabrata fungemia: Completed Eraxis on 6/26.  Hypotension: Due to hypovolemia.  Improved.  Type 2 diabetes, diet controlled: Currently on TPN and remains on sliding scale insulin.  Sacral decubitus ulcer stage IV/left lower extremity stage II decubitus ulcer: Present on admission.  Local wound care.    Chronic pain due to bilateral below knee amputation: On gabapentin and  oxycodone.  Acute kidney injury: Resolving.  No more urinary retention.  Nutrition Status: Nutrition Problem: Moderate Malnutrition Etiology: chronic illness (EC fistula, perforated  prepyloric ulcer on TPN) Signs/Symptoms: percent weight loss, moderate fat depletion, moderate muscle depletion Percent weight loss: 9 % (in 1 mo) Interventions: Refer to RD note for recommendations  On TPN, managed by pharmacy.  IV fluids, electrolyte replacement along with TPN protocol.   DVT prophylaxis: SCD   Code Status: Limited code Family Communication: None today, will discuss with family Disposition Plan: Status is: Inpatient  Remains inpatient appropriate because:Unsafe d/c plan   Dispo: The patient is from: Home              Anticipated d/c is to: SNF              Anticipated d/c date is: 2 days              Patient currently is not medically stable to d/c.         Consultants:   Infectious disease, signed off  Gastroenterology, curbside  Procedures:   None  Antimicrobials:  Anti-infectives (From admission, onward)   Start     Dose/Rate Route Frequency Ordered Stop   09/13/19 1030  anidulafungin (ERAXIS) 100 mg in sodium chloride 0.9 % 100 mL IVPB  Status:  Discontinued        100 mg 78 mL/hr over 100 Minutes Intravenous Every 24 hours 09/12/19 0952 09/14/19 1230   09/12/19 1030  anidulafungin (ERAXIS) 200 mg in sodium chloride 0.9 % 200 mL IVPB  Status:  Discontinued        200 mg 78 mL/hr over 200 Minutes Intravenous  Once 09/12/19 9024 09/15/19 0973         Subjective: Patient seen and examined.  No overnight events.  Pleasantly confused.  Afebrile overnight.  States she is hungry but unable to eat.  Objective: Vitals:   09/17/19 0405 09/17/19 1555 09/17/19 2145 09/18/19 0419  BP: (!) 169/72 (!) 180/80 (!) 165/78 (!) 173/86  Pulse: 81 80 78 66  Resp: 17 17 16 15   Temp: 99.9 F (37.7 C) (!) 100.8 F (38.2 C) 98.4 F (36.9 C) 97.7 F (36.5 C)  TempSrc: Tympanic Oral Oral Oral  SpO2: 100% 94% 96% 99%  Weight:      Height:        Intake/Output Summary (Last 24 hours) at 09/18/2019 1035 Last data filed at 09/18/2019 0425 Gross per 24  hour  Intake 773.06 ml  Output 1800 ml  Net -1026.94 ml   Filed Weights   10/02/2019 1521  Weight: 45.4 kg    Examination:  General exam: Appears calm and comfortable, chronically sick looking Respiratory system: Bilateral clear Cardiovascular system: No added sounds Gastrointestinal system: Soft nontender.  Enterocutaneous fistula with ostomy bag in place, minimal liquidy stool.  Central nervous system: Alert and oriented. No focal neurological deficits. Bilateral BKA.   Data Reviewed: I have personally reviewed following labs and imaging studies  CBC: Recent Labs  Lab 09/13/19 1047 09/13/19 1047 09/13/19 1822 09/14/19 0416 09/15/19 0420 09/16/19 0846 09/18/19 0444  WBC 7.2  --   --  12.2* 13.2* 11.9* 13.1*  NEUTROABS  --   --   --   --  10.0*  --   --   HGB 6.6*   < > 8.6* 8.5* 7.4* 7.0* 6.6*  HCT 21.5*   < > 27.4* 26.9* 23.3* 22.1* 20.9*  MCV 93.9  --   --  94.7 95.9 94.4 92.9  PLT 344  --   --  356 331 353 311   < > = values in this interval not displayed.   Basic Metabolic Panel: Recent Labs  Lab 09/12/19 0443 09/13/19 1047 09/14/19 0416 09/15/19 0420 09/16/19 0415 09/17/19 0425 09/18/19 0444  NA 131*   < > 135 137 133* 132* 133*  K 4.0   < > 4.0 3.6 3.2* 3.5 3.6  CL 96*   < > 105 110 105 106 107  CO2 27   < > 22 19* 17* 18* 20*  GLUCOSE 106*   < > 170* 262* 256* 133* 199*  BUN 32*   < > 27* 28* 32* 35* 33*  CREATININE 1.01*   < > 1.54* 2.28* 2.12* 1.45* 1.14*  CALCIUM 8.3*   < > 8.1* 8.1* 7.9* 7.8* 7.8*  MG 1.9  --   --  1.5* 2.1 1.6* 2.1  PHOS 3.7  --   --  3.7 3.3 2.2* 2.9   < > = values in this interval not displayed.   GFR: Estimated Creatinine Clearance: 38.6 mL/min (A) (by C-G formula based on SCr of 1.14 mg/dL (H)). Liver Function Tests: Recent Labs  Lab 09/12/2019 1531 09/12/19 0443 09/15/19 0420 09/16/19 0415 09/18/19 0444  AST 14* 13* 11* 12* 14*  ALT 10 9 9 8 12   ALKPHOS 83 73 73 82 93  BILITOT 0.5 0.6 0.7 0.6 0.1*  PROT 7.5 6.9  6.2* 6.3* 6.0*  ALBUMIN 1.9* 1.7* 1.5* 1.4* 1.3*   No results for input(s): LIPASE, AMYLASE in the last 168 hours. No results for input(s): AMMONIA in the last 168 hours. Coagulation Profile: Recent Labs  Lab 09/12/19 0443  INR 1.2   Cardiac Enzymes: No results for input(s): CKTOTAL, CKMB, CKMBINDEX, TROPONINI in the last 168 hours. BNP (last 3 results) No results for input(s): PROBNP in the last 8760 hours. HbA1C: No results for input(s): HGBA1C in the last 72 hours. CBG: Recent Labs  Lab 09/17/19 1652 09/17/19 1937 09/17/19 2352 09/18/19 0418 09/18/19 0802  GLUCAP 105* 98 124* 182* 217*   Lipid Profile: No results for input(s): CHOL, HDL, LDLCALC, TRIG, CHOLHDL, LDLDIRECT in the last 72 hours. Thyroid Function Tests: No results for input(s): TSH, T4TOTAL, FREET4, T3FREE, THYROIDAB in the last 72 hours. Anemia Panel: No results for input(s): VITAMINB12, FOLATE, FERRITIN, TIBC, IRON, RETICCTPCT in the last 72 hours. Sepsis Labs: Recent Labs  Lab 09/12/19 0903 09/13/19 0752 09/14/19 0416  PROCALCITON 0.25 0.20 0.33    Recent Results (from the past 240 hour(s))  SARS Coronavirus 2 by RT PCR (hospital order, performed in [facility] hospital lab) Nasopharyngeal Nasopharyngeal Swab     Status: None   Collection Time: 09/18/2019 10:06 PM   Specimen: Nasopharyngeal Swab  Result Value Ref Range Status   SARS Coronavirus 2 NEGATIVE NEGATIVE Final    Comment: (NOTE) SARS-CoV-2 target nucleic acids are NOT DETECTED.  The SARS-CoV-2 RNA is generally detectable in upper and lower respiratory specimens during the acute phase of infection. The lowest concentration of SARS-CoV-2 viral copies this assay can detect is 250 copies / mL. A negative result does not preclude SARS-CoV-2 infection and should not be used as the sole basis for treatment or other patient management decisions.  A negative result may occur with improper specimen collection / handling, submission of specimen  other than nasopharyngeal swab, presence of viral mutation(s) within the areas targeted by this assay, and inadequate number of viral copies (<250  copies / mL). A negative result must be combined with clinical observations, patient history, and epidemiological information.  Fact Sheet for Patients:   StrictlyIdeas.no  Fact Sheet for Healthcare Providers: BankingDealers.co.za  This test is not yet approved or  cleared by the [place] FDA and has been authorized for detection and/or diagnosis of SARS-CoV-2 by FDA under an Emergency Use Authorization (EUA).  This EUA will remain in effect (meaning this test can be used) for the duration of the COVID-19 declaration under Section 564(b)(1) of the Act, 21 U.S.C. section 360bbb-3(b)(1), unless the authorization is terminated or revoked sooner.  Performed at [facility], [place] [address] [place], [place] [address]   Culture, blood (Routine X 2) w Reflex to ID Panel     Status: None   Collection Time: 09/12/19  4:05 AM   Specimen: BLOOD  Result Value Ref Range Status   Specimen Description BLOOD PICC LINE  Final   Special Requests   Final    BOTTLES DRAWN AEROBIC AND ANAEROBIC Blood Culture results may not be optimal due to an excessive volume of blood received in culture bottles   Culture   Final    NO GROWTH 5 DAYS Performed at [facility], [place] [address] [place], [place] [address]    Report Status 09/17/2019 FINAL  Final  Urine Culture     Status: Abnormal   Collection Time: 09/12/19  8:36 AM   Specimen: Urine, Random  Result Value Ref Range Status   Specimen Description URINE, RANDOM  Final   Special Requests   Final    NONE Performed at [facility], [place] [address], [place], [place] [address]    Culture MULTIPLE SPECIES PRESENT, SUGGEST RECOLLECTION (A)  Final   Report Status 09/13/2019 FINAL  Final  Culture, blood (Routine X 2) w Reflex to ID Panel      Status: None   Collection Time: 09/12/19 12:14 PM   Specimen: BLOOD  Result Value Ref Range Status   Specimen Description BLOOD LEFT ANTECUBITAL  Final   Special Requests   Final    BOTTLES DRAWN AEROBIC AND ANAEROBIC Blood Culture results may not be optimal due to an inadequate volume of blood received in culture bottles   Culture   Final    NO GROWTH 5 DAYS Performed at [facility], [place] [address] [place], [place] [address]    Report Status 09/17/2019 FINAL  Final  Urine Culture     Status: None   Collection Time: 09/15/19  8:03 AM   Specimen: Urine, Random  Result Value Ref Range Status   Specimen Description URINE, RANDOM  Final   Special Requests NONE  Final   Culture   Final    NO GROWTH Performed at [facility], [place] [address] [place], [place] [address]    Report Status 09/16/2019 FINAL  Final         Radiology Studies: No results found.      Scheduled Meds: . sodium chloride   Intravenous Once  . Chlorhexidine Gluconate Cloth  6 each Topical Daily  . cholestyramine  4 g Oral BID  . collagenase   Topical Daily  . cyanocobalamin  1,000 mcg Intramuscular Once  . feeding supplement  1 Container Oral TID BM  . gabapentin  300 mg Oral BID  . insulin aspart  0-9 Units Subcutaneous Q4H  . metoprolol tartrate  50 mg Oral BID  . nystatin  5 mL  Oral QID  . pantoprazole  40 mg Intravenous Q12H  . sodium chloride flush  10-40 mL Intracatheter Q12H   Continuous Infusions: . sodium chloride 75 mL/hr at 09/17/19 1816  . sodium chloride    . TPN ADULT (ION) 65 mL/hr at 09/17/19 1831  . TPN ADULT (ION)       LOS: 6 days    Time spent: 30 minutes     Barb Merino, MD [facility] Pager [phone]

## 2019-09-18 NOTE — Progress Notes (Signed)
CRITICAL VALUE ALERT  Critical Value:  hbg 6.6  Date & Time Notied:  09/18/2019 6:49am  Provider Notified: M. Sharlet [place] (paged)  Orders Received/Actions taken: awaiting order

## 2019-09-18 NOTE — TOC Progression Note (Addendum)
Transition of Care [facility]) - Progression Note    Patient Details  Name: Gina [name] MRN: [id number] Date of Birth: [birth year]  Transition of Care [facility]) CM/SW Contact  Jacalyn Lefevre Edson Snowball, RN Phone Number: 09/18/2019, 8:27 AM  Clinical Narrative:     Still no SNF bed offers.   Called [facility] and left a message with Corrina regarding possible admission.Awaiting call back.  Also left a message with Rob at [facility] . Awaiting call back.   Phillips with [facility] returned call. She will review clinicals and get back to NCM.  Expected Discharge Plan: [place] Barriers to Discharge: Continued Medical Work up, Ship broker, Other (comment), Awaiting State Approval (PASRR) (TPN needed)  Expected Discharge Plan and Services Expected Discharge Plan: [place]       Living arrangements for the past 2 months: Apartment                                       Social Determinants of Health (SDOH) Interventions    Readmission Risk Interventions Readmission Risk Prevention Plan 09/16/2019 08/29/2019 08/19/2019  Post Dischage Appt - - -  Medication Screening - - -  Transportation Screening Complete Complete Complete  PCP follow-up - - -  PCP or Specialist Appt within 3-5 Days - - -  Not Complete comments - - -  HRI or [place] Work Consult for [place] Planning/Counseling - - -  SW consult not completed comments - - -  Palliative Care Screening - - -  Palliative Care Screening Not Complete Comments - - -  Medication Review ([place]) Referral to Pharmacy Complete Complete  PCP or Specialist appointment within 3-5 days of discharge Not Complete Complete -  PCP/Specialist Appt Not Complete comments disposition pending - -  HRI or Home Care Consult Complete Complete Complete  SW Recovery Care/Counseling Consult Complete Complete Complete  Palliative Care Screening Complete  Complete Not Applicable  Skilled Nursing Facility Complete Complete Not Applicable  Some recent data might be hidden

## 2019-09-18 NOTE — Progress Notes (Signed)
PHARMACY - TOTAL PARENTERAL NUTRITION CONSULT NOTE  Indication:  EC fistula  Patient Measurements: Height: 5\' 2"  (157.5 cm) Weight: 45.4 kg (100 lb) IBW/kg (Calculated) : 50.1 TPN AdjBW (KG): 45.4 Body mass index is 18.29 kg/m. Usual Weight: 53-60 kg  Assessment:  2 YOF presents on 10/02/2019 with anemia, likely concurrent GIB. Patient has a history of perforated prepyloric ulcer s/p Delford Field.  She has an Chief Strategy Officer in place from January 2021 due to [facility] fistula and has been on TPN since 02/2019.  She was recently admitted with candidemia, resulting in PICC replacement.  Pharmacy consulted to continue TPN.  Patient reports not missing any TPN bags PTA. Per further discussion with MD on 7/2, held off on starting TPN initially due to fever and recent fungemia. TPN resumed 7/5. ID monitoring off antibiotics for now.  Glucose / Insulin: hx DM controlled by diet outpatient. Required insulin gtt last admit. CBGs variable off TPN, 79-262. Now controlled 98-182. Utilized 8 units SSI / 24 hrs Electrolytes: Na 133, CO2 low 20 (up), Mag up to 2.1 (s/p Mag sulfate 2g IV x 1 yesterday), phos up to 2.9 (s/p Kphos 43mmol IV x 1 yesterday), others WNL Renal: AKI - SCr back down to 1.14(0.9 on 7/1; BL 0.6-0.8), BUN down to 33; UOP 1.7 ml/kg/hr LFTs / TGs: LFTs / Tbili / TG WNL Prealbumin / albumin: Prealbumin down to <5, albumin 1.3 Intake / Output; MIVF: NS at 75 ml/hr; LBM 7/6 GI Imaging: none since admit Surgeries / Procedures: none since admit  Central access: PICC removed 6/12; replaced 6/16 TPN start date: chronic TPN  Nutritional Goals (per [facility] staff on 7/2): kCal: 1472 kCal, Protein (concentrated AA): 90g, CHO 180g, ILE 50g, no insulin Infuse 1800 mL over 14 hrs  RD goals, updated on 7/7: 1600-1800 kCal, 75-90g protein, > 1.6L fluid per day  Current Nutrition:  HH/CMD Boost TID between meals - refused 1 dose yesterday  Plan:  Increase TPN to goal rate 75 mL/hr at 1800 with  CBGs controlled <200. Begin transition back to cyclic TPN on 7/9 if patient tolerates. This TPN provides 90 g AA, 216 g CHO, and 49 g ILE, for total 1580 kCal; meeting 100% of protein and 99% of kCal goals. Electrolytes in TPN: Na/K/Phos/Mag will increase relative to rate increase, Ca removed. Continue max acetate Add standard MVI and trace elements to TPN Continue sensitive SSI q4h SSI and adjust as needed Decrease NS to 65 ml/hr with TPN rate increase at 1800 F/u AM labs   Arturo Morton, PharmD, BCPS Please check AMION for all [place] contact numbers Clinical Pharmacist 09/18/2019 7:55 AM

## 2019-09-19 LAB — TYPE AND SCREEN
ABO/RH(D): O POS
Antibody Screen: NEGATIVE
Unit division: 0

## 2019-09-19 LAB — BPAM RBC
Blood Product Expiration Date: 202108032359
ISSUE DATE / TIME: 202107081326
Unit Type and Rh: 5100

## 2019-09-19 LAB — GLUCOSE, CAPILLARY
Glucose-Capillary: 127 mg/dL — ABNORMAL HIGH (ref 70–99)
Glucose-Capillary: 139 mg/dL — ABNORMAL HIGH (ref 70–99)
Glucose-Capillary: 145 mg/dL — ABNORMAL HIGH (ref 70–99)
Glucose-Capillary: 168 mg/dL — ABNORMAL HIGH (ref 70–99)
Glucose-Capillary: 210 mg/dL — ABNORMAL HIGH (ref 70–99)
Glucose-Capillary: 236 mg/dL — ABNORMAL HIGH (ref 70–99)
Glucose-Capillary: 279 mg/dL — ABNORMAL HIGH (ref 70–99)

## 2019-09-19 LAB — MAGNESIUM: Magnesium: 2.1 mg/dL (ref 1.7–2.4)

## 2019-09-19 LAB — CBC
HCT: 26.2 % — ABNORMAL LOW (ref 36.0–46.0)
Hemoglobin: 8.5 g/dL — ABNORMAL LOW (ref 12.0–15.0)
MCH: 30.6 pg (ref 26.0–34.0)
MCHC: 32.4 g/dL (ref 30.0–36.0)
MCV: 94.2 fL (ref 80.0–100.0)
Platelets: 316 10*3/uL (ref 150–400)
RBC: 2.78 MIL/uL — ABNORMAL LOW (ref 3.87–5.11)
RDW: 15.4 % (ref 11.5–15.5)
WBC: 12.7 10*3/uL — ABNORMAL HIGH (ref 4.0–10.5)
nRBC: 0 % (ref 0.0–0.2)

## 2019-09-19 LAB — BASIC METABOLIC PANEL
Anion gap: 6 (ref 5–15)
BUN: 39 mg/dL — ABNORMAL HIGH (ref 6–20)
CO2: 19 mmol/L — ABNORMAL LOW (ref 22–32)
Calcium: 7.7 mg/dL — ABNORMAL LOW (ref 8.9–10.3)
Chloride: 107 mmol/L (ref 98–111)
Creatinine, Ser: 1.03 mg/dL — ABNORMAL HIGH (ref 0.44–1.00)
GFR calc Af Amer: >60 mL/min (ref >60)
GFR calc non Af Amer: 60 mL/min — ABNORMAL LOW (ref >60)
Glucose, Bld: 255 mg/dL — ABNORMAL HIGH (ref 70–99)
Potassium: 3.2 mmol/L — ABNORMAL LOW (ref 3.5–5.1)
Sodium: 132 mmol/L — ABNORMAL LOW (ref 135–145)

## 2019-09-19 LAB — PHOSPHORUS: Phosphorus: 3.4 mg/dL (ref 2.5–4.6)

## 2019-09-19 MED ORDER — POTASSIUM CHLORIDE 10 MEQ/50ML IV SOLN
10.0000 meq | INTRAVENOUS | Status: AC
  Administered 2019-09-19 (×3): 10 meq via INTRAVENOUS
  Filled 2019-09-19 (×3): qty 50

## 2019-09-19 MED ORDER — TRACE MINERALS CU-MN-SE-ZN 300-55-60-3000 MCG/ML IV SOLN
INTRAVENOUS | Status: AC
  Filled 2019-09-19: qty 600

## 2019-09-19 NOTE — Progress Notes (Signed)
PHARMACY - TOTAL PARENTERAL NUTRITION CONSULT NOTE  Indication:  EC fistula  Patient Measurements: Height: 5\' 2"  (157.5 cm) Weight: 45.4 kg (100 lb) IBW/kg (Calculated) : 50.1 TPN AdjBW (KG): 45.4 Body mass index is 18.29 kg/m. Usual Weight: 53-60 kg  Assessment:  68 YOF presents on 10/04/2019 with anemia, likely concurrent GIB. Patient has a history of perforated prepyloric ulcer s/p Delford Field.  She has an Chief Strategy Officer in place from January 2021 due to [facility] fistula and has been on TPN since 02/2019.  She was recently admitted with candidemia, resulting in PICC replacement.  Pharmacy consulted to continue TPN.  Patient reports not missing any TPN bags PTA. Per further discussion with MD on 7/2, held off on starting TPN initially due to fever and recent fungemia. TPN resumed 7/5. ID monitoring off antibiotics for now.  Glucose / Insulin: hx DM controlled by diet outpatient. Required insulin gtt last admit. CBGs variable off TPN, 79-262. Now elevated 153-279 when advanced to goal TPN (and reformulated with increased CHO to meet updated RD goals on 7/8). Utilized 18 units SSI / 24 hrs Electrolytes: Na 132, K 3.6>3.2, CO2 low 19 (down), others WNL Renal: AKI - SCr back down to 1.03 (0.9 on 7/1; BL 0.6-0.8), BUN up to 39; UOP 0.8 ml/kg/hr LFTs / TGs: LFTs / Tbili / TG WNL Prealbumin / albumin: Prealbumin down to <5, albumin 1.3 Intake / Output; MIVF: +4.2L this admit; MIVF: NS at 65 ml/hr; LBM 7/6 GI Imaging: none since admit Surgeries / Procedures: none since admit  Central access: PICC removed 6/12; replaced 6/16 TPN start date: chronic TPN  Nutritional Goals (Chronic TPN formula, per [facility] staff on 7/2): kCal: 1472 kCal, Protein (concentrated AA): 90g, CHO 180g, ILE 50g, no insulin Infuse 1800 mL over 14 hrs  RD goals, updated on 7/7: 1600-1800 kCal, 75-90g protein, > 1.6L fluid per day  Current Nutrition:  HH/CMD Boost TID between meals - refused 1 dose yesterday  Plan:   Continue TPN at goal rate 75 mL/hr at 1800. Attempt transition back to cyclic TPN on 1/09 if CBGs are controlled <200. TPN provides 90 g AA, 216 g CHO, and 49 g ILE, for total 1580 kCal; meeting 100% of protein and 99% of kCal goals. Electrolytes in TPN: Increase Na to 90 mEq/L, increase K to 10 mEq/L; Continue current Phos/Mag, Ca removed. Continue max acetate -Give K runs x 3 Add standard MVI and trace elements to TPN Continue sensitive SSI q4h SSI + add 10 units regular insulin to TPN and adjust as needed D/c IVMF (NS) per discussion with Dr. Sloan Leiter F/u AM labs   Arturo Morton, PharmD, BCPS Please check AMION for all [place] contact numbers Clinical Pharmacist 09/19/2019 8:22 AM

## 2019-09-19 NOTE — Progress Notes (Signed)
Physical Therapy Treatment Patient Details Name: Gina [name] MRN: [id number] DOB: 11/19/1960 Today's Date: 09/19/2019    History of Present Illness Pt is a 59 year old with past medical history significant for perforated prepyloric ulcer in 2020 status post exploratory laparotomy with Gina [name] patch, chronic TPN via right arm PICC line, insulin-dependent diabetes, history off bilateral lower quadrant drains along with enterocutaneous fistula with colostomy bag, chronic sacral pressure ulcer stage II, schizophrenia, bilateral BKA who presents for evaluation of fever of unclear source. Also with anemia thought to be due to chronic GI bleed.     PT Comments    Pt supine in bed on arrival this session.  Required max encouragement to move to edge of bed and continues to refuse OOB to recliner.  Pt continues to benefit from SNF placement.      Follow Up Recommendations  SNF     Equipment Recommendations  None recommended by PT    Recommendations for Other Services       Precautions / Restrictions Precautions Precautions: Fall Precaution Comments: colostomy, bilateral BKA Restrictions Other Position/Activity Restrictions: bilateral BKA    Mobility  Bed Mobility Overal bed mobility: Needs Assistance       Supine to sit: Max assist Sit to supine: Max assist   General bed mobility comments: Pt required assistance to move from supin<>sit.  Pt sat edge of bed x 10 min with support of railing on R side.  Transfers Overall transfer level:  (refused)                  Ambulation/Gait                 Stairs             Wheelchair Mobility    Modified Rankin (Stroke Patients Only)       Balance Overall balance assessment: Needs assistance Sitting-balance support: Bilateral upper extremity supported Sitting balance-Leahy Scale: Poor Sitting balance - Comments: reliant on BUE support and supervision Postural control: Posterior lean   Standing  balance-Leahy Scale:  (unable BKA)                              Cognition Arousal/Alertness: Awake/alert Behavior During Therapy:  (tearful intermittently) Overall Cognitive Status: History of cognitive impairments - at baseline                                 General Comments: difficult to understand speech at times, patient is tearful because she does not want to go to SNF, tried to educate patient on current level of assistance impacting safety to be cared for at home at this time      Exercises Other Exercises Other Exercises: Core rotation with external assistance to R and L x 10 reps in seated position. Other Exercises: shoulder flexion to 90 degrees as she was unable to go further due to pain x 10 reps AAROM B UEs in seated position. Other Exercises: LAQs and marching B LEs in seated x 10reps AROM.    General Comments        Pertinent Vitals/Pain Pain Assessment: Faces Faces Pain Scale: Hurts even more Pain Location: back with repositioning Pain Descriptors / Indicators: Grimacing;Crying Pain Intervention(s): Monitored during session;Repositioned    Home Living  Prior Function            PT Goals (current goals can now be found in the care plan section) Acute Rehab PT Goals Patient Stated Goal: To go back to bed. Potential to Achieve Goals: Poor Progress towards PT goals: Not progressing toward goals - comment (pt continues to refuse OOB transfers)    Frequency           PT Plan Current plan remains appropriate    Co-evaluation              AM-PAC PT "6 Clicks" Mobility   Outcome Measure  Help needed turning from your back to your side while in a flat bed without using bedrails?: A Lot Help needed moving from lying on your back to sitting on the side of a flat bed without using bedrails?: Total Help needed moving to and from a bed to a chair (including a wheelchair)?: Total Help needed  standing up from a chair using your arms (e.g., wheelchair or bedside chair)?: Total Help needed to walk in hospital room?: Total Help needed climbing 3-5 steps with a railing? : Total 6 Click Score: 7    End of Session Equipment Utilized During Treatment: Gait belt Activity Tolerance: Patient limited by lethargy;Patient limited by pain Patient left: in bed;with call bell/phone within reach Nurse Communication: Mobility status PT Visit Diagnosis: Other abnormalities of gait and mobility (R26.89);Muscle weakness (generalized) (M62.81)     Time: 3009-2330 PT Time Calculation (min) (ACUTE ONLY): 22 min  Charges:  $Therapeutic Activity: 8-22 mins                     Gina [name] , PTA Acute Rehabilitation Services Pager [phone] Office [phone]     Gina [name] Gina [name] 09/19/2019, 6:28 PM

## 2019-09-19 NOTE — Progress Notes (Signed)
Occupational Therapy Treatment Patient Details Name: Gina [name] MRN: [id number] DOB: [birth year] Today's Date: 09/19/2019    History of present illness Pt is a 59 year old with past medical history significant for perforated prepyloric ulcer in 2020 status post exploratory laparotomy with [name] patch, chronic TPN via right arm PICC line, insulin-dependent diabetes, history off bilateral lower quadrant drains along with enterocutaneous fistula with colostomy bag, chronic sacral pressure ulcer stage II, schizophrenia, bilateral BKA who presents for evaluation of fever of unclear source. Also with anemia thought to be due to chronic GI bleed.    OT comments  Patient appears emotional upon arrival, refusing to take some of her morning medications with nursing. Max A to reposition to midline in preparation for self care tasks. Min A for oral care with mouth wash, set up for washing face. Min cues to progress to next task as patient tends to perseverate. Patient tearful at end of session, put on gospel music per patient request for comfort. Continue to recommend SNF as patient requires max A to reposition in bed with limited initiation by patient for pressure relief or to correct posture.     Follow Up Recommendations  SNF    Equipment Recommendations  None recommended by OT       Precautions / Restrictions Precautions Precautions: Fall Precaution Comments: colostomy, bilateral BKA       Mobility Bed Mobility Overal bed mobility: Needs Assistance Bed Mobility:  (repositioning)           General bed mobility comments: cue patient to assist with R UE to pull on bed rail, poor UE strength requiring max A to reposition trunk to midline in preparation for self care tasks. Positioned with pillows to support midline posture.          ADL either performed or assessed with clinical judgement   ADL Overall ADL's : Needs assistance/impaired Eating/Feeding: Minimal assistance;Bed  level Eating/Feeding Details (indicate cue type and reason): holding cup to drink water as patient has poor fine motor control and strength dropping cup Grooming: Oral care;Wash/dry face;Minimal assistance;Bed level Grooming Details (indicate cue type and reason): min A for managing kidney basin and cups to rinse mouth wash due to poor fine motor control, strength. set up with washing face with min cues for thoroughness                                               Cognition Arousal/Alertness: Awake/alert Behavior During Therapy:  (tearful) Overall Cognitive Status: History of cognitive impairments - at baseline Area of Impairment: Attention;Following commands                   Current Attention Level: Focused   Following Commands: Follows one step commands with increased time       General Comments: difficult to understand speech at times, patient is tearful because she does not want to go to SNF, tried to educate patient on current level of assistance impacting safety to be cared for at home at this time                   Pertinent Vitals/ Pain       Pain Assessment: Faces Faces Pain Scale: Hurts even more Pain Location: back with repositioning Pain Descriptors / Indicators: Grimacing;Crying Pain Intervention(s): Monitored during session         Frequency  Min 2X/week        Progress Toward Goals  OT Goals(current goals can now be found in the care plan section)  Progress towards OT goals: Progressing toward goals  Acute Rehab OT Goals Patient Stated Goal: To go back to bed. OT Goal Formulation: With patient Time For Goal Achievement: 09/27/19 Potential to Achieve Goals: Fair ADL Goals Pt Will Perform Upper Body Bathing: with min guard assist;sitting (in bed or recliner) Pt Will Perform Lower Body Bathing: with min assist;sitting/lateral leans;with adaptive equipment Pt Will Perform Upper Body Dressing: with min assist;sitting (in  bed or recliner) Pt Will Perform Lower Body Dressing: with min assist;sitting/lateral leans;with adaptive equipment Pt Will Transfer to Toilet: with min assist;anterior/posterior transfer;with transfer board;grab bars;regular height toilet Pt/caregiver will Perform Home Exercise Program: Increased strength;Both right and left upper extremity;With written HEP provided (theraband level 1) Additional ADL Goal #1: Pt will verbalize and initiate independent pressure relief to promote healthy skin integrity Additional ADL Goal #2: Pt will be able to roll right and left 3 times each side with S to A with basic ADLs at bedlevel  Plan Discharge plan remains appropriate       AM-PAC OT "6 Clicks" Daily Activity     Outcome Measure   Help from another person eating meals?: A Little Help from another person taking care of personal grooming?: A Lot Help from another person toileting, which includes using toliet, bedpan, or urinal?: A Lot (can A in rolling) Help from another person bathing (including washing, rinsing, drying)?: A Lot Help from another person to put on and taking off regular upper body clothing?: A Lot Help from another person to put on and taking off regular lower body clothing?: A Lot (can A with rolling) 6 Click Score: 13    End of Session  OT Visit Diagnosis: Other abnormalities of gait and mobility (R26.89);Muscle weakness (generalized) (M62.81);Pain Pain - part of body:  (back)   Activity Tolerance Patient tolerated treatment well   Patient Left in bed;with call bell/phone within reach           Time: 1030-1055 OT Time Calculation (min): 25 min  Charges: OT General Charges $OT Visit: 1 Visit OT Treatments $Self Care/Home Management : 23-37 mins  Delbert Phenix OT OT office: [place] 09/19/2019, 11:46 AM

## 2019-09-19 NOTE — Care Management (Addendum)
Did not hear back from [facility] yesterday. Messaged today.   Raquel Sarna with [facility] reviewing.   Update 1220 no response from [facility]. [place] with [facility], she will have someone call NCM back.   Update 1250 Spoke to [place] with [facility], she will review clinicals, await determination.   Called patient's daughter Scarlett Portlock and left voicemail with my direct call back number.  [name] Camposano called back and aware of above. [name] would like NCM to [place] same done.   [place] with [facility] called back and patient does not have the required ICU days for insurance to cover LTAC. Patient does not have the revenue codes for LTAC . Confirmed  with Raquel Sarna with [facility] daughter [name] and left message.  Will secure chat attending MD Dr Barb Merino . TOC Lead Olga Coaster aware.  Magdalen Spatz RN

## 2019-09-19 NOTE — Progress Notes (Signed)
PROGRESS NOTE    Gina [name]  IOX:[id number] DOB: Dec 27, 1960 DOA: 09/15/2019 PCP: [name], MD    Brief Narrative:  59 year old with past medical history significant for perforated prepyloric ulcer in 2020 status post exploratory laparotomy with [name] patch, chronic TPN via right arm PICC line, insulin-dependent diabetes, history of bilateral lower quadrant drains along with enterocutaneous fistula with colostomy bag, chronic sacral pressure ulcer stage II, schizophrenia, bilateral BKA who presents for evaluation of fever.  She has been admitted a few times this year for sepsis including MRSA bacteremia, fungemia, Candida peritonitis in April, Proteus UTI, Candida glabrata who completed Eraxis 6/26.  Presents with persistent fevers and low hemoglobin. Patient reported that she was sent to the ED due to low hemoglobin.  Patient was sent from PCP office due to hypotension, systolic blood pressure in the 60s, patient had an episode of nausea and vomiting. In the ED her hemoglobin was stable around 7.7, hyponatremia, hyperglycemia.  Fecal occult was noted to be positive.   Assessment & Plan:   Principal Problem:   Acute GI bleeding Active Problems:   Hyperglycemia (glucose 203 on admission)   HTN (hypertension)   Type 2 diabetes mellitus with other circulatory complications (HCC)   Severe protein-calorie malnutrition (HCC)   Anemia of chronic disease   Schizophrenia (HCC)   Sacral decubitus ulcer, stage II   Below-knee amputee (HCC)   Intra-abdominal fluid collection, chronic   S/P BKA (below knee amputation) bilateral (HCC)   Enterocutaneous fistula   Fever   Iron deficiency anemia due to chronic blood loss   Hypoalbuminemia   On total parenteral nutrition (TPN)   PVD (peripheral vascular disease) (HCC) s/p bilateral BKAs   Hypotension   Hyponatremia (Sodium 131 on admission)   PUD (peptic ulcer disease) with history of perforated gastric ulcer s/p [name] patch   Chronic pain  syndrome   Proteinuria   Hypomagnesemia   Hypophosphatemia   Stage 4 pressure ulcer ([place]) of bilateral coccyx   Pressure ulcer, unstageable ([place]) to right distal knee, left distal knee   Pressure ulcer of left mid thigh, stage 2 (HCC)   Pressure injury, stage 3 ([place]) to mid sacrum, left leg and right leg   Underweight   Encounter for blood transfusion on 09/13/19   Acute urinary retention   Hypoglycemia  Fever of unknown origin, present on admission: Repeat blood cultures, urinalysis and urine cultures negative. Infectious disease consulted, patient was restarted on Eraxis that she received for 2 days and that was discontinued. No evidence of recurrent fever or localization of infection.  Monitoring off antibiotics.  Afebrile now. CT scan abdomen pelvis chronic intra-abdominal collection, enterocutaneous fistula. Symptomatic treatment.  Anemia: Iron deficiency with likely acute on chronic anemia.  Hemoccult positive stool present on admission.  Suspect chronic GI bleeding. Hemoglobin 6.6 - 1 unit of PRBC-  8.5-7.4-7-6.6- 1 unit PRBC -8.5 Continue IV Protonix, will change to p.o. Protonix on discharge. Previous provider discussed with Dr. Paulita Fujita with [facility] and they recommended symptomatic treatment. Received IV iron on 7/5.  History of Candida glabrata fungemia: Completed Eraxis on 6/26.  Hypotension: Due to hypovolemia.  Improved.  Type 2 diabetes, diet controlled: Currently on TPN and remains on sliding scale insulin. Insulin adjusted with TPN.  Sacral decubitus ulcer stage IV/left lower extremity stage II decubitus ulcer: Present on admission.  Local wound care.    Chronic pain due to bilateral below knee amputation: On gabapentin and oxycodone.  Acute kidney injury: Resolving.  No  more urinary retention.  Nutrition Status: Nutrition Problem: Moderate Malnutrition Etiology: chronic illness (EC fistula, perforated prepyloric ulcer on TPN) Signs/Symptoms: percent weight  loss, moderate fat depletion, moderate muscle depletion Percent weight loss: 9 % (in 1 mo) Interventions: Refer to RD note for recommendations  On TPN, managed by pharmacy.  IV fluids, electrolyte replacement along with TPN protocol. Needing high complexity of care , will benefit with LTAC level of care.    DVT prophylaxis: SCD   Code Status: Limited code Family Communication: updated daughter 7/8 Disposition Plan: Status is: Inpatient  Remains inpatient appropriate because:Unsafe d/c plan, needs higher level of care. LTAC or [facility] level of care    Dispo: The patient is from: Home              Anticipated d/c is to: LTAC/ [facility]               Anticipated d/c date is: 2 days              Patient currently is not medically stable to d/c. only can transfer to [place]      Consultants:   Infectious disease, signed off  Gastroenterology, curbside  Procedures:   None  Antimicrobials:  Anti-infectives (From admission, onward)   Start     Dose/Rate Route Frequency Ordered Stop   09/13/19 1030  anidulafungin (ERAXIS) 100 mg in sodium chloride 0.9 % 100 mL IVPB  Status:  Discontinued        100 mg 78 mL/hr over 100 Minutes Intravenous Every 24 hours 09/12/19 0952 09/14/19 1230   09/12/19 1030  anidulafungin (ERAXIS) 200 mg in sodium chloride 0.9 % 200 mL IVPB  Status:  Discontinued        200 mg 78 mL/hr over 200 Minutes Intravenous  Once 09/12/19 5852 09/15/19 7782         Subjective: Patient seen and examined. Sleepy. No other overnight events.  Feels hungry and needs someone to feed her.  No other overnight events.  Objective: Vitals:   09/18/19 1335 09/18/19 1403 09/18/19 1651 09/18/19 1948  BP: (!) 151/78 (!) 170/79 (!) 161/88 (!) 184/94  Pulse: 72 71 70 72  Resp: 16 16 15 18   Temp: 98.4 F (36.9 C) 98.2 F (36.8 C) 98.8 F (37.1 C) 97.9 F (36.6 C)  TempSrc: Oral Oral Oral Oral  SpO2: 98% 98% 96% 91%  Weight:      Height:        Intake/Output Summary  (Last 24 hours) at 09/19/2019 1141 Last data filed at 09/19/2019 0600 Gross per 24 hour  Intake 2310.95 ml  Output 870 ml  Net 1440.95 ml   Filed Weights   09/26/2019 1521  Weight: 45.4 kg    Examination:  General exam: Appears calm and comfortable, chronically sick looking, sleepy. Respiratory system: Bilateral clear Cardiovascular system: No added sounds Gastrointestinal system: Soft nontender.  Enterocutaneous fistula with ostomy bag in place, minimal liquidy stool.  Central nervous system: Alert and oriented. No focal neurological deficits. Bilateral BKA.   Data Reviewed: I have personally reviewed following labs and imaging studies  CBC: Recent Labs  Lab 09/14/19 0416 09/14/19 0416 09/15/19 0420 09/16/19 0846 09/18/19 0444 09/18/19 2030 09/19/19 0440  WBC 12.2*  --  13.2* 11.9* 13.1*  --  12.7*  NEUTROABS  --   --  10.0*  --   --   --   --   HGB 8.5*   < > 7.4* 7.0* 6.6* 8.7* 8.5*  HCT  26.9*   < > 23.3* 22.1* 20.9* 27.0* 26.2*  MCV 94.7  --  95.9 94.4 92.9  --  94.2  PLT 356  --  331 353 311  --  316   < > = values in this interval not displayed.   Basic Metabolic Panel: Recent Labs  Lab 09/15/19 0420 09/16/19 0415 09/17/19 0425 09/18/19 0444 09/19/19 0440  NA 137 133* 132* 133* 132*  K 3.6 3.2* 3.5 3.6 3.2*  CL 110 105 106 107 107  CO2 19* 17* 18* 20* 19*  GLUCOSE 262* 256* 133* 199* 255*  BUN 28* 32* 35* 33* 39*  CREATININE 2.28* 2.12* 1.45* 1.14* 1.03*  CALCIUM 8.1* 7.9* 7.8* 7.8* 7.7*  MG 1.5* 2.1 1.6* 2.1 2.1  PHOS 3.7 3.3 2.2* 2.9 3.4   GFR: Estimated Creatinine Clearance: 42.7 mL/min (A) (by C-G formula based on SCr of 1.03 mg/dL (H)). Liver Function Tests: Recent Labs  Lab 09/15/19 0420 09/16/19 0415 09/18/19 0444  AST 11* 12* 14*  ALT 9 8 12   ALKPHOS 73 82 93  BILITOT 0.7 0.6 0.1*  PROT 6.2* 6.3* 6.0*  ALBUMIN 1.5* 1.4* 1.3*   No results for input(s): LIPASE, AMYLASE in the last 168 hours. No results for input(s): AMMONIA in the last  168 hours. Coagulation Profile: No results for input(s): INR, PROTIME in the last 168 hours. Cardiac Enzymes: No results for input(s): CKTOTAL, CKMB, CKMBINDEX, TROPONINI in the last 168 hours. BNP (last 3 results) No results for input(s): PROBNP in the last 8760 hours. HbA1C: No results for input(s): HGBA1C in the last 72 hours. CBG: Recent Labs  Lab 09/18/19 1945 09/19/19 0024 09/19/19 0424 09/19/19 0745 09/19/19 1131  GLUCAP 153* 168* 210* 279* 236*   Lipid Profile: No results for input(s): CHOL, HDL, LDLCALC, TRIG, CHOLHDL, LDLDIRECT in the last 72 hours. Thyroid Function Tests: No results for input(s): TSH, T4TOTAL, FREET4, T3FREE, THYROIDAB in the last 72 hours. Anemia Panel: No results for input(s): VITAMINB12, FOLATE, FERRITIN, TIBC, IRON, RETICCTPCT in the last 72 hours. Sepsis Labs: Recent Labs  Lab 09/13/19 0752 09/14/19 0416  PROCALCITON 0.20 0.33    Recent Results (from the past 240 hour(s))  SARS Coronavirus 2 by RT PCR (hospital order, performed in [facility] hospital lab) Nasopharyngeal Nasopharyngeal Swab     Status: None   Collection Time: 09/27/2019 10:06 PM   Specimen: Nasopharyngeal Swab  Result Value Ref Range Status   SARS Coronavirus 2 NEGATIVE NEGATIVE Final    Comment: (NOTE) SARS-CoV-2 target nucleic acids are NOT DETECTED.  The SARS-CoV-2 RNA is generally detectable in upper and lower respiratory specimens during the acute phase of infection. The lowest concentration of SARS-CoV-2 viral copies this assay can detect is 250 copies / mL. A negative result does not preclude SARS-CoV-2 infection and should not be used as the sole basis for treatment or other patient management decisions.  A negative result may occur with improper specimen collection / handling, submission of specimen other than nasopharyngeal swab, presence of viral mutation(s) within the areas targeted by this assay, and inadequate number of viral copies (<250 copies / mL). A  negative result must be combined with clinical observations, patient history, and epidemiological information.  Fact Sheet for Patients:   StrictlyIdeas.no  Fact Sheet for Healthcare Providers: BankingDealers.co.za  This test is not yet approved or  cleared by the [place] FDA and has been authorized for detection and/or diagnosis of SARS-CoV-2 by FDA under an Emergency Use Authorization (EUA).  This  EUA will remain in effect (meaning this test can be used) for the duration of the COVID-19 declaration under Section 564(b)(1) of the Act, 21 U.S.C. section 360bbb-3(b)(1), unless the authorization is terminated or revoked sooner.  Performed at [facility], [place] [address] [place], [place] [address]   Culture, blood (Routine X 2) w Reflex to ID Panel     Status: None   Collection Time: 09/12/19  4:05 AM   Specimen: BLOOD  Result Value Ref Range Status   Specimen Description BLOOD PICC LINE  Final   Special Requests   Final    BOTTLES DRAWN AEROBIC AND ANAEROBIC Blood Culture results may not be optimal due to an excessive volume of blood received in culture bottles   Culture   Final    NO GROWTH 5 DAYS Performed at [facility], [place] [address] [place], [place] [address]    Report Status 09/17/2019 FINAL  Final  Urine Culture     Status: Abnormal   Collection Time: 09/12/19  8:36 AM   Specimen: Urine, Random  Result Value Ref Range Status   Specimen Description URINE, RANDOM  Final   Special Requests   Final    NONE Performed at [facility], [place] [address] [place], [place] [address]    Culture MULTIPLE SPECIES PRESENT, SUGGEST RECOLLECTION (A)  Final   Report Status 09/13/2019 FINAL  Final  Culture, blood (Routine X 2) w Reflex to ID Panel     Status: None   Collection Time: 09/12/19 12:14 PM   Specimen: BLOOD  Result Value Ref Range Status   Specimen Description BLOOD LEFT ANTECUBITAL  Final   Special  Requests   Final    BOTTLES DRAWN AEROBIC AND ANAEROBIC Blood Culture results may not be optimal due to an inadequate volume of blood received in culture bottles   Culture   Final    NO GROWTH 5 DAYS Performed at [facility], [place] [address] [place], [place] [address]    Report Status 09/17/2019 FINAL  Final  Urine Culture     Status: None   Collection Time: 09/15/19  8:03 AM   Specimen: Urine, Random  Result Value Ref Range Status   Specimen Description URINE, RANDOM  Final   Special Requests NONE  Final   Culture   Final    NO GROWTH Performed at [facility], [place] [name], [place], [place] [address]    Report Status 09/16/2019 FINAL  Final         Radiology Studies: No results found.      Scheduled Meds: . Chlorhexidine Gluconate Cloth  6 each Topical Daily  . cholestyramine  4 g Oral BID  . collagenase   Topical Daily  . cyanocobalamin  1,000 mcg Intramuscular Once  . feeding supplement  1 Container Oral TID BM  . gabapentin  300 mg Oral BID  . insulin aspart  0-9 Units Subcutaneous Q4H  . metoprolol tartrate  50 mg Oral BID  . nystatin  5 mL Oral QID  . pantoprazole  40 mg Intravenous Q12H  . sodium chloride flush  10-40 mL Intracatheter Q12H   Continuous Infusions: . potassium chloride 10 mEq (09/19/19 1021)  . TPN ADULT (ION) 75 mL/hr at 09/18/19 1746  . TPN ADULT (ION)       LOS: 7 days    Time spent: 30 minutes     Barb Merino, MD [facility] Pager [phone]

## 2019-09-19 NOTE — Progress Notes (Signed)
[facility] Progress Note     Subjective: CC-  Sitting up in bed. Vomiting once last night. Mild nausea this AM. Not very hungry and did not eat much of her breakfast. Thinks the nausea is from nystatin that she started for oral thrush. Afebrile off antibiotics.  Objective: Vital signs in last 24 hours: Temp:  [97.9 F (36.6 C)-98.8 F (37.1 C)] 97.9 F (36.6 C) (07/08 1948) Pulse Rate:  [70-72] 72 (07/08 1948) Resp:  [15-18] 18 (07/08 1948) BP: (151-184)/(78-94) 184/94 (07/08 1948) SpO2:  [91 %-98 %] 91 % (07/08 1948) Last BM Date: 09/16/19  Intake/Output from previous day: 07/08 0701 - 07/09 0700 In: 2311 [P.O.:360; I.V.:1636; Blood:315] Out: 870 [Urine:850; Drains:20] Intake/Output this shift: No intake/output data recorded.  PE: Gen:  Alert, NAD HEENT: EOM's intact, pupils equal and round Pulm:  rate and effort normal Abd: Soft, NT/ND, EC fistula with Eakins pouch in place and thin feculent drainage Skin: warm and dry  Lab Results:  Recent Labs    09/18/19 0444 09/18/19 0444 09/18/19 2030 09/19/19 0440  WBC 13.1*  --   --  12.7*  HGB 6.6*   < > 8.7* 8.5*  HCT 20.9*   < > 27.0* 26.2*  PLT 311  --   --  316   < > = values in this interval not displayed.   BMET Recent Labs    09/18/19 0444 09/19/19 0440  NA 133* 132*  K 3.6 3.2*  CL 107 107  CO2 20* 19*  GLUCOSE 199* 255*  BUN 33* 39*  CREATININE 1.14* 1.03*  CALCIUM 7.8* 7.7*   PT/INR No results for input(s): LABPROT, INR in the last 72 hours. CMP     Component Value Date/Time   NA 132 (L) 09/19/2019 0440   NA 139 10/04/2017 1705   K 3.2 (L) 09/19/2019 0440   CL 107 09/19/2019 0440   CO2 19 (L) 09/19/2019 0440   GLUCOSE 255 (H) 09/19/2019 0440   BUN 39 (H) 09/19/2019 0440   BUN 11 10/04/2017 1705   CREATININE 1.03 (H) 09/19/2019 0440   CREATININE 0.68 04/28/2016 1640   CALCIUM 7.7 (L) 09/19/2019 0440   PROT 6.0 (L) 09/18/2019 0444   PROT 7.1 10/04/2017 1705   ALBUMIN 1.3  (L) 09/18/2019 0444   ALBUMIN 3.7 10/04/2017 1705   AST 14 (L) 09/18/2019 0444   ALT 12 09/18/2019 0444   ALKPHOS 93 09/18/2019 0444   BILITOT 0.1 (L) 09/18/2019 0444   BILITOT <0.2 10/04/2017 1705   GFRNONAA 60 (L) 09/19/2019 0440   GFRNONAA >89 04/28/2016 1640   GFRAA >60 09/19/2019 0440   GFRAA >89 04/28/2016 1640   Lipase     Component Value Date/Time   LIPASE 21 08/17/2019 0950       Studies/Results: No results found.  Anti-infectives: Anti-infectives (From admission, onward)   Start     Dose/Rate Route Frequency Ordered Stop   09/13/19 1030  anidulafungin (ERAXIS) 100 mg in sodium chloride 0.9 % 100 mL IVPB  Status:  Discontinued        100 mg 78 mL/hr over 100 Minutes Intravenous Every 24 hours 09/12/19 0952 09/14/19 1230   09/12/19 1030  anidulafungin (ERAXIS) 200 mg in sodium chloride 0.9 % 200 mL IVPB  Status:  Discontinued        200 mg 78 mL/hr over 200 Minutes Intravenous  Once 09/12/19 0952 09/15/19 0903       Assessment/Plan Anemia - Hgb 8.5 from 8.7, stable Malnutrition -  pre albumin <5 (7/5), on TNA here and at home PMH perforated pyloric ulcer s/p graham patch 02/03/19 Dr. Lucia Gaskins  EC fistula  - stable, managed well with Eakin pouch. -Afebrile -CT 7/2 with chronic intra-abdominal collection associated with the known enterocutaneous fistula -No acute surgical needs - general surgery will sign off, please call with questions or concerns  FEN: HH/CM diet, Boost, TPN ID: nystatin PO QID  VTE: per primary   LOS: 7 days    Wellington Hampshire, [facility] 09/19/2019, 11:25 AM Please see Amion for pager number during day hours 7:00am-4:30pm

## 2019-09-20 LAB — CBC
HCT: 23.7 % — ABNORMAL LOW (ref 36.0–46.0)
Hemoglobin: 7.7 g/dL — ABNORMAL LOW (ref 12.0–15.0)
MCH: 29.8 pg (ref 26.0–34.0)
MCHC: 32.5 g/dL (ref 30.0–36.0)
MCV: 91.9 fL (ref 80.0–100.0)
Platelets: 290 10*3/uL (ref 150–400)
RBC: 2.58 MIL/uL — ABNORMAL LOW (ref 3.87–5.11)
RDW: 15.7 % — ABNORMAL HIGH (ref 11.5–15.5)
WBC: 8.9 10*3/uL (ref 4.0–10.5)
nRBC: 0 % (ref 0.0–0.2)

## 2019-09-20 LAB — GLUCOSE, CAPILLARY
Glucose-Capillary: 145 mg/dL — ABNORMAL HIGH (ref 70–99)
Glucose-Capillary: 141 mg/dL — ABNORMAL HIGH (ref 70–99)
Glucose-Capillary: 192 mg/dL — ABNORMAL HIGH (ref 70–99)
Glucose-Capillary: 182 mg/dL — ABNORMAL HIGH (ref 70–99)

## 2019-09-20 LAB — BASIC METABOLIC PANEL
Anion gap: 6 (ref 5–15)
BUN: 42 mg/dL — ABNORMAL HIGH (ref 6–20)
CO2: 22 mmol/L (ref 22–32)
Calcium: 7.7 mg/dL — ABNORMAL LOW (ref 8.9–10.3)
Chloride: 108 mmol/L (ref 98–111)
Creatinine, Ser: 0.94 mg/dL (ref 0.44–1.00)
GFR calc Af Amer: >60 mL/min (ref >60)
GFR calc non Af Amer: >60 mL/min (ref >60)
Glucose, Bld: 158 mg/dL — ABNORMAL HIGH (ref 70–99)
Potassium: 3.1 mmol/L — ABNORMAL LOW (ref 3.5–5.1)
Sodium: 136 mmol/L (ref 135–145)

## 2019-09-20 LAB — MAGNESIUM: Magnesium: 1.9 mg/dL (ref 1.7–2.4)

## 2019-09-20 LAB — PHOSPHORUS: Phosphorus: 2.8 mg/dL (ref 2.5–4.6)

## 2019-09-20 MED ORDER — INSULIN ASPART 100 UNIT/ML ~~LOC~~ SOLN
0.0000 [IU] | SUBCUTANEOUS | Status: DC
  Administered 2019-09-20: 2 [IU] via SUBCUTANEOUS
  Administered 2019-09-21: 1 [IU] via SUBCUTANEOUS

## 2019-09-20 MED ORDER — TRACE MINERALS CU-MN-SE-ZN 300-55-60-3000 MCG/ML IV SOLN
INTRAVENOUS | Status: AC
  Filled 2019-09-20: qty 600.67

## 2019-09-20 MED ORDER — POTASSIUM CHLORIDE 10 MEQ/50ML IV SOLN
10.0000 meq | INTRAVENOUS | Status: AC
  Administered 2019-09-20 (×4): 10 meq via INTRAVENOUS
  Filled 2019-09-20 (×6): qty 50

## 2019-09-20 NOTE — Progress Notes (Signed)
PHARMACY - TOTAL PARENTERAL NUTRITION CONSULT NOTE  Indication:  EC fistula  Patient Measurements: Height: 5\' 2"  (157.5 cm) Weight: 45.4 kg (100 lb) IBW/kg (Calculated) : 50.1 TPN AdjBW (KG): 45.4 Body mass index is 18.29 kg/m. Usual Weight: 53-60 kg  Assessment:  8 YOF presents on 09/20/2019 with anemia, likely concurrent GIB. Patient has a history of perforated prepyloric ulcer s/p Delford Field.  She has an Chief Strategy Officer in place from January 2021 due to [facility] fistula and has been on TPN since 02/2019.  She was recently admitted with candidemia, resulting in PICC replacement.  Pharmacy consulted to continue TPN.  Patient reports not missing any TPN bags PTA. Per further discussion with MD on 7/2, held off on starting TPN initially due to fever and recent fungemia. TPN resumed 7/5. ID monitoring off antibiotics for now.  N/V noted overnight 7/9 (patient thinks from her nystatin started for oral thrush).  Glucose / Insulin: hx DM controlled by diet outpatient. Required insulin gtt last admit. CBGs variable off TPN, 79-262. CBGs now improved 127-158. Utilized 12 units SSI / 24 hrs + 10 units insulin added to TPN bag on 7/9. Electrolytes: Na 132>136, K 3.2>3.1 (s/p K runs x3 yesterday), CO2 normalized; others WNL Renal: AKI resolving - SCr back down to 0.94 (0.9 on 7/1; BL 0.6-0.8), BUN up to 42; UOP 1.1 ml/kg/hr LFTs / TGs: LFTs / Tbili / TG WNL Prealbumin / albumin: Prealbumin down to <5, albumin 1.3 Intake / Output; MIVF: Drain output 620 mL/24hrs; +2.8L this admit; MIVF d/c'd 7/9; LBM 7/9 GI Imaging: 7/2 CT abd/pelvis - similar extent, increased fullness of chronic intra-abdominal collection associated w/ known ECF, cholelithiasis Surgeries / Procedures: none since admit  Central access: PICC removed 6/12; replaced 6/16 TPN start date: chronic TPN  Nutritional Goals (Chronic TPN formula, per [facility] staff on 7/2): kCal: 1472 kCal, Protein (concentrated AA): 90g, CHO 180g, ILE 50g, no  insulin Infuse 1800 mL over 14 hrs  RD goals, updated on 7/7: 1600-1800 kCal, 75-90g protein, > 1.6L fluid per day  Current Nutrition:  HH/CMD Boost TID between meals - refused 1 dose yesterday  Plan:  Begin transition back to cyclic TPN over 18 hrs at 1800. Cycle 1802 mL over 18 hrs - 53 mL/hr x 1 hr; then 106 mL/hr x 16 hrs; then 53 mL/hr x 1 hr. Will attempt further advancement of cyclic TPN tomorrow if patient tolerates. TPN provides 90 g AA, 216 g CHO, and 49 g ILE, for total 1582 kCal; meeting 100% of protein and 99% of kCal goals. Electrolytes in TPN: Increase K to 20 mEq/L, Continue current Na 90 mEq/L, Phos 10 mmol/L, and Mag 30mEq/L, Ca removed. Continue max acetate -Give K runs x 6 Add standard MVI and trace elements to TPN Adjust to custom cyclic TPN 4x sensitive SSI + continue 10 units regular insulin added to TPN bag and adjust as needed F/u AM labs   Arturo Morton, PharmD, BCPS Please check AMION for all [place] contact numbers Clinical Pharmacist 09/20/2019 9:54 AM

## 2019-09-20 NOTE — Progress Notes (Signed)
PROGRESS NOTE    Gina [name]  IRC:[id number] DOB: Jan 10, 1961 DOA: 09/18/2019 PCP: [name], MD    Brief Narrative:  59 year old with past medical history significant for perforated prepyloric ulcer in 2020 status post exploratory laparotomy with [name] patch, chronic TPN via right arm PICC line, insulin-dependent diabetes, history of bilateral lower quadrant drains along with enterocutaneous fistula with colostomy bag, chronic sacral pressure ulcer stage II, schizophrenia, bilateral BKA who presents for evaluation of fever.  She has been admitted a few times this year for sepsis including MRSA bacteremia, fungemia, Candida peritonitis in April, Proteus UTI, Candida glabrata who completed Eraxis 6/26.  Presents with persistent fevers and low hemoglobin. Patient reported that she was sent to the ED due to low hemoglobin.  Patient was sent from PCP office due to hypotension, systolic blood pressure in the 60s, patient had an episode of nausea and vomiting. In the ED her hemoglobin was stable around 7.7, hyponatremia, hyperglycemia.  Fecal occult was noted to be positive.   Assessment & Plan:   Principal Problem:   Acute GI bleeding Active Problems:   Hyperglycemia (glucose 203 on admission)   HTN (hypertension)   Type 2 diabetes mellitus with other circulatory complications (HCC)   Severe protein-calorie malnutrition (HCC)   Anemia of chronic disease   Schizophrenia (HCC)   Sacral decubitus ulcer, stage II   Below-knee amputee (HCC)   Intra-abdominal fluid collection, chronic   S/P BKA (below knee amputation) bilateral (HCC)   Enterocutaneous fistula   Fever   Iron deficiency anemia due to chronic blood loss   Hypoalbuminemia   On total parenteral nutrition (TPN)   PVD (peripheral vascular disease) (HCC) s/p bilateral BKAs   Hypotension   Hyponatremia (Sodium 131 on admission)   PUD (peptic ulcer disease) with history of perforated gastric ulcer s/p [name] patch   Chronic pain  syndrome   Proteinuria   Hypomagnesemia   Hypophosphatemia   Stage 4 pressure ulcer ([place]) of bilateral coccyx   Pressure ulcer, unstageable ([place]) to right distal knee, left distal knee   Pressure ulcer of left mid thigh, stage 2 (HCC)   Pressure injury, stage 3 ([place]) to mid sacrum, left leg and right leg   Underweight   Encounter for blood transfusion on 09/13/19   Acute urinary retention   Hypoglycemia  Fever of unknown origin, present on admission: Repeat blood cultures, urinalysis and urine cultures negative. Infectious disease consulted, patient was restarted on Eraxis that she received for 2 days and that was discontinued. No evidence of recurrent fever or localization of infection.  Monitoring off antibiotics.  Afebrile now. CT scan abdomen pelvis chronic intra-abdominal collection, enterocutaneous fistula. Symptomatic treatment.  Anemia: Iron deficiency with likely acute on chronic anemia.  Hemoccult positive stool present on admission.  Suspect chronic GI bleeding. Hemoglobin 6.6 - 1 unit of PRBC-  8.5-7.4-7-6.6- 1 unit PRBC -8.5-7.5.  We will continue to closely monitor. Continue IV Protonix, will change to p.o. Protonix on discharge. Previous provider discussed with Dr. Paulita Fujita with [facility] and they recommended symptomatic treatment. Received IV iron on 7/5.  History of Candida glabrata fungemia: Completed Eraxis on 6/26.  Hypotension: Due to hypovolemia.  Improved.  Type 2 diabetes, diet controlled: Currently on TPN and remains on sliding scale insulin. Insulin adjusted with TPN.  Sacral decubitus ulcer stage IV/left lower extremity stage II decubitus ulcer: Present on admission.  Local wound care.    Chronic pain due to bilateral below knee amputation: On gabapentin and oxycodone.  Acute kidney injury: Resolving.  No more urinary retention.  Nutrition Status: Nutrition Problem: Moderate Malnutrition Etiology: chronic illness (EC fistula, perforated prepyloric ulcer  on TPN) Signs/Symptoms: percent weight loss, moderate fat depletion, moderate muscle depletion Percent weight loss: 9 % (in 1 mo) Interventions: Refer to RD note for recommendations  On TPN, managed by pharmacy.  IV fluids, electrolyte replacement along with TPN protocol. Needing high complexity of care , will benefit with placement at nursing home.   DVT prophylaxis: SCD   Code Status: Limited code Family Communication: updated daughter 7/8 Disposition Plan: Status is: Inpatient  Remains inpatient appropriate because:Unsafe d/c plan, needs higher level of care. LTAC or [facility] level of care    Dispo: The patient is from: Home              Anticipated d/c is to: LTAC/ [facility]               Anticipated d/c date is: 2 days              Patient currently is not medically stable to d/c. only can transfer to [facility] or skilled nursing facility.      Consultants:   Infectious disease, signed off  Gastroenterology, curbside  Procedures:   None  Antimicrobials:  Anti-infectives (From admission, onward)   Start     Dose/Rate Route Frequency Ordered Stop   09/13/19 1030  anidulafungin (ERAXIS) 100 mg in sodium chloride 0.9 % 100 mL IVPB  Status:  Discontinued        100 mg 78 mL/hr over 100 Minutes Intravenous Every 24 hours 09/12/19 0952 09/14/19 1230   09/12/19 1030  anidulafungin (ERAXIS) 200 mg in sodium chloride 0.9 % 200 mL IVPB  Status:  Discontinued        200 mg 78 mL/hr over 200 Minutes Intravenous  Once 09/12/19 7371 09/15/19 0626         Subjective: Patient seen and examined.  No overnight events.  "I want to go home" Objective: Vitals:   09/18/19 1948 09/19/19 1358 09/19/19 2104 09/20/19 0445  BP: (!) 184/94 (!) 163/90 (!) 189/79 (!) 159/81  Pulse: 72 74 79 72  Resp: 18 15 18 18   Temp: 97.9 F (36.6 C) 99.5 F (37.5 C) 99.1 F (37.3 C) 98.4 F (36.9 C)  TempSrc: Oral Oral Oral Oral  SpO2: 91% 96% 95% 98%  Weight:      Height:        Intake/Output  Summary (Last 24 hours) at 09/20/2019 1405 Last data filed at 09/20/2019 0449 Gross per 24 hour  Intake 270 ml  Output 1325 ml  Net -1055 ml   Filed Weights   09/24/2019 1521  Weight: 45.4 kg    Examination:  General exam: Appears calm and comfortable, chronically sick looking, sleepy. Respiratory system: Bilateral clear Cardiovascular system: No added sounds Gastrointestinal system: Soft nontender.  Enterocutaneous fistula with ostomy bag in place with Eakins patch, dark substance present..  Central nervous system: Alert and oriented. No focal neurological deficits. Bilateral BKA.   Data Reviewed: I have personally reviewed following labs and imaging studies  CBC: Recent Labs  Lab 09/15/19 0420 09/15/19 0420 09/16/19 0846 09/18/19 0444 09/18/19 2030 09/19/19 0440 09/20/19 0430  WBC 13.2*  --  11.9* 13.1*  --  12.7* 8.9  NEUTROABS 10.0*  --   --   --   --   --   --   HGB 7.4*   < > 7.0* 6.6* 8.7* 8.5* 7.7*  HCT 23.3*   < > 22.1* 20.9* 27.0* 26.2* 23.7*  MCV 95.9  --  94.4 92.9  --  94.2 91.9  PLT 331  --  353 311  --  316 290   < > = values in this interval not displayed.   Basic Metabolic Panel: Recent Labs  Lab 09/16/19 0415 09/17/19 0425 09/18/19 0444 09/19/19 0440 09/20/19 0430  NA 133* 132* 133* 132* 136  K 3.2* 3.5 3.6 3.2* 3.1*  CL 105 106 107 107 108  CO2 17* 18* 20* 19* 22  GLUCOSE 256* 133* 199* 255* 158*  BUN 32* 35* 33* 39* 42*  CREATININE 2.12* 1.45* 1.14* 1.03* 0.94  CALCIUM 7.9* 7.8* 7.8* 7.7* 7.7*  MG 2.1 1.6* 2.1 2.1 1.9  PHOS 3.3 2.2* 2.9 3.4 2.8   GFR: Estimated Creatinine Clearance: 46.8 mL/min (by C-G formula based on SCr of 0.94 mg/dL). Liver Function Tests: Recent Labs  Lab 09/15/19 0420 09/16/19 0415 09/18/19 0444  AST 11* 12* 14*  ALT 9 8 12   ALKPHOS 73 82 93  BILITOT 0.7 0.6 0.1*  PROT 6.2* 6.3* 6.0*  ALBUMIN 1.5* 1.4* 1.3*   No results for input(s): LIPASE, AMYLASE in the last 168 hours. No results for input(s):  AMMONIA in the last 168 hours. Coagulation Profile: No results for input(s): INR, PROTIME in the last 168 hours. Cardiac Enzymes: No results for input(s): CKTOTAL, CKMB, CKMBINDEX, TROPONINI in the last 168 hours. BNP (last 3 results) No results for input(s): PROBNP in the last 8760 hours. HbA1C: No results for input(s): HGBA1C in the last 72 hours. CBG: Recent Labs  Lab 09/19/19 1637 09/19/19 1936 09/19/19 2334 09/20/19 0409 09/20/19 0812  GLUCAP 145* 127* 139* 145* 141*   Lipid Profile: No results for input(s): CHOL, HDL, LDLCALC, TRIG, CHOLHDL, LDLDIRECT in the last 72 hours. Thyroid Function Tests: No results for input(s): TSH, T4TOTAL, FREET4, T3FREE, THYROIDAB in the last 72 hours. Anemia Panel: No results for input(s): VITAMINB12, FOLATE, FERRITIN, TIBC, IRON, RETICCTPCT in the last 72 hours. Sepsis Labs: Recent Labs  Lab 09/14/19 0416  PROCALCITON 0.33    Recent Results (from the past 240 hour(s))  SARS Coronavirus 2 by RT PCR (hospital order, performed in [facility] hospital lab) Nasopharyngeal Nasopharyngeal Swab     Status: None   Collection Time: 09/26/2019 10:06 PM   Specimen: Nasopharyngeal Swab  Result Value Ref Range Status   SARS Coronavirus 2 NEGATIVE NEGATIVE Final    Comment: (NOTE) SARS-CoV-2 target nucleic acids are NOT DETECTED.  The SARS-CoV-2 RNA is generally detectable in upper and lower respiratory specimens during the acute phase of infection. The lowest concentration of SARS-CoV-2 viral copies this assay can detect is 250 copies / mL. A negative result does not preclude SARS-CoV-2 infection and should not be used as the sole basis for treatment or other patient management decisions.  A negative result may occur with improper specimen collection / handling, submission of specimen other than nasopharyngeal swab, presence of viral mutation(s) within the areas targeted by this assay, and inadequate number of viral copies (<250 copies / mL). A  negative result must be combined with clinical observations, patient history, and epidemiological information.  Fact Sheet for Patients:   StrictlyIdeas.no  Fact Sheet for Healthcare Providers: BankingDealers.co.za  This test is not yet approved or  cleared by the [place] FDA and has been authorized for detection and/or diagnosis of SARS-CoV-2 by FDA under an Emergency Use Authorization (EUA).  This EUA will remain in  effect (meaning this test can be used) for the duration of the COVID-19 declaration under Section 564(b)(1) of the Act, 21 U.S.C. section 360bbb-3(b)(1), unless the authorization is terminated or revoked sooner.  Performed at [facility], [place] [address], [place], [place] [address]   Culture, blood (Routine X 2) w Reflex to ID Panel     Status: None   Collection Time: 09/12/19  4:05 AM   Specimen: BLOOD  Result Value Ref Range Status   Specimen Description BLOOD PICC LINE  Final   Special Requests   Final    BOTTLES DRAWN AEROBIC AND ANAEROBIC Blood Culture results may not be optimal due to an excessive volume of blood received in culture bottles   Culture   Final    NO GROWTH 5 DAYS Performed at [facility], [place] [address] [place], [place] [address]    Report Status 09/17/2019 FINAL  Final  Urine Culture     Status: Abnormal   Collection Time: 09/12/19  8:36 AM   Specimen: Urine, Random  Result Value Ref Range Status   Specimen Description URINE, RANDOM  Final   Special Requests   Final    NONE Performed at [facility], [place] [address] [place], [place] [address]    Culture MULTIPLE SPECIES PRESENT, SUGGEST RECOLLECTION (A)  Final   Report Status 09/13/2019 FINAL  Final  Culture, blood (Routine X 2) w Reflex to ID Panel     Status: None   Collection Time: 09/12/19 12:14 PM   Specimen: BLOOD  Result Value Ref Range Status   Specimen Description BLOOD LEFT ANTECUBITAL  Final   Special  Requests   Final    BOTTLES DRAWN AEROBIC AND ANAEROBIC Blood Culture results may not be optimal due to an inadequate volume of blood received in culture bottles   Culture   Final    NO GROWTH 5 DAYS Performed at [facility], [place] [address] [place], [place] [address]    Report Status 09/17/2019 FINAL  Final  Urine Culture     Status: None   Collection Time: 09/15/19  8:03 AM   Specimen: Urine, Random  Result Value Ref Range Status   Specimen Description URINE, RANDOM  Final   Special Requests NONE  Final   Culture   Final    NO GROWTH Performed at [facility], [place] [address], [place], [place] [address]    Report Status 09/16/2019 FINAL  Final         Radiology Studies: No results found.      Scheduled Meds: . Chlorhexidine Gluconate Cloth  6 each Topical Daily  . cholestyramine  4 g Oral BID  . collagenase   Topical Daily  . cyanocobalamin  1,000 mcg Intramuscular Once  . feeding supplement  1 Container Oral TID BM  . gabapentin  300 mg Oral BID  . insulin aspart  0-9 Units Subcutaneous 4 times per day  . metoprolol tartrate  50 mg Oral BID  . nystatin  5 mL Oral QID  . pantoprazole  40 mg Intravenous Q12H  . sodium chloride flush  10-40 mL Intracatheter Q12H   Continuous Infusions: . potassium chloride    . TPN ADULT (ION) 75 mL/hr at 09/19/19 1732  . TPN CYCLIC-ADULT (ION)       LOS: 8 days    Time spent: 30 minutes     Barb Merino, MD [facility] Pager [phone]

## 2019-09-21 LAB — CBC
HCT: 24.6 % — ABNORMAL LOW (ref 36.0–46.0)
Hemoglobin: 7.9 g/dL — ABNORMAL LOW (ref 12.0–15.0)
MCH: 29.9 pg (ref 26.0–34.0)
MCHC: 32.1 g/dL (ref 30.0–36.0)
MCV: 93.2 fL (ref 80.0–100.0)
Platelets: 306 10*3/uL (ref 150–400)
RBC: 2.64 MIL/uL — ABNORMAL LOW (ref 3.87–5.11)
RDW: 15.9 % — ABNORMAL HIGH (ref 11.5–15.5)
WBC: 7.7 10*3/uL (ref 4.0–10.5)
nRBC: 0 % (ref 0.0–0.2)

## 2019-09-21 LAB — GLUCOSE, CAPILLARY
Glucose-Capillary: 106 mg/dL — ABNORMAL HIGH (ref 70–99)
Glucose-Capillary: 139 mg/dL — ABNORMAL HIGH (ref 70–99)
Glucose-Capillary: 148 mg/dL — ABNORMAL HIGH (ref 70–99)
Glucose-Capillary: 151 mg/dL — ABNORMAL HIGH (ref 70–99)
Glucose-Capillary: 98 mg/dL (ref 70–99)

## 2019-09-21 LAB — BASIC METABOLIC PANEL
Anion gap: 6 (ref 5–15)
BUN: 48 mg/dL — ABNORMAL HIGH (ref 6–20)
CO2: 27 mmol/L (ref 22–32)
Calcium: 8 mg/dL — ABNORMAL LOW (ref 8.9–10.3)
Chloride: 104 mmol/L (ref 98–111)
Creatinine, Ser: 0.77 mg/dL (ref 0.44–1.00)
GFR calc Af Amer: >60 mL/min (ref >60)
GFR calc non Af Amer: >60 mL/min (ref >60)
Glucose, Bld: 116 mg/dL — ABNORMAL HIGH (ref 70–99)
Potassium: 3.7 mmol/L (ref 3.5–5.1)
Sodium: 137 mmol/L (ref 135–145)

## 2019-09-21 LAB — MAGNESIUM: Magnesium: 2.1 mg/dL (ref 1.7–2.4)

## 2019-09-21 LAB — PHOSPHORUS: Phosphorus: 2.8 mg/dL (ref 2.5–4.6)

## 2019-09-21 MED ORDER — INSULIN ASPART 100 UNIT/ML ~~LOC~~ SOLN
0.0000 [IU] | SUBCUTANEOUS | Status: DC
  Administered 2019-09-21: 1 [IU] via SUBCUTANEOUS
  Administered 2019-09-21 – 2019-09-22 (×2): 2 [IU] via SUBCUTANEOUS
  Administered 2019-09-22: 5 [IU] via SUBCUTANEOUS
  Administered 2019-09-22: 2 [IU] via SUBCUTANEOUS
  Administered 2019-09-23: 5 [IU] via SUBCUTANEOUS

## 2019-09-21 MED ORDER — BOOST / RESOURCE BREEZE PO LIQD CUSTOM
1.0000 | Freq: Three times a day (TID) | ORAL | Status: DC
  Administered 2019-09-21 – 2019-09-29 (×24): 1 via ORAL
  Filled 2019-09-21: qty 1

## 2019-09-21 MED ORDER — TRACE MINERALS CU-MN-SE-ZN 300-55-60-3000 MCG/ML IV SOLN
INTRAVENOUS | Status: AC
  Filled 2019-09-21: qty 600

## 2019-09-21 MED ORDER — HYDRALAZINE HCL 25 MG PO TABS
25.0000 mg | ORAL_TABLET | Freq: Four times a day (QID) | ORAL | Status: DC | PRN
  Administered 2019-09-21 – 2019-09-23 (×4): 25 mg via ORAL
  Filled 2019-09-21 (×4): qty 1

## 2019-09-21 NOTE — Progress Notes (Addendum)
PHARMACY - TOTAL PARENTERAL NUTRITION CONSULT NOTE  Indication:  EC fistula  Patient Measurements: Height: 5\' 2"  (157.5 cm) Weight: 45.4 kg (100 lb) IBW/kg (Calculated) : 50.1 TPN AdjBW (KG): 45.4 Body mass index is 18.29 kg/m. Usual Weight: 53-60 kg  Assessment:  24 YOF presents on 09/23/2019 with anemia, likely concurrent GIB. Patient has a history of perforated prepyloric ulcer s/p Delford Field.  She has an Chief Strategy Officer in place from January 2021 due to [facility] fistula and has been on TPN since 02/2019.  She was recently admitted with candidemia, resulting in PICC replacement.  Pharmacy consulted to continue TPN.  Patient reports not missing any TPN bags PTA. Per further discussion with MD on 7/2, held off on starting TPN initially due to fever and recent fungemia. TPN resumed 7/5. ID monitoring off antibiotics for now.  N/V noted overnight 7/9 (patient thinks from her nystatin started for oral thrush and has refused most doses since).  Glucose / Insulin: hx DM controlled by diet outpatient. Required insulin gtt last admit. CBGs variable off TPN, 79-262. CBGs now improved 98-182 (lower CBGs likely due to refusing feeding supplements 7/10 due to "wrong flavor." Utilized 3 units SSI / 24 hrs + 10 units insulin added to TPN bag on 7/9. Began cycling TPN again 7/10.  Electrolytes: K 3.1>3.7 (s/p K runs x4 yesterday), corrected Ca high-normal 10.2 (none in TPN), others WNL and stable Renal: AKI resolving - SCr back down to 0.77 (0.9 on 7/1; BL 0.6-0.8), BUN up to 48; UOP 1.3 ml/kg/hr LFTs / TGs: LFTs / Tbili / TG WNL Prealbumin / albumin: Prealbumin down to <5, albumin 1.3 Intake / Output; MIVF: Drain output 620>25 mL/24hrs; +1.8L this admit; MIVF d/c'd 7/9 GI Imaging: 7/2 CT abd/pelvis - similar extent, increased fullness of chronic intra-abdominal collection associated w/ known ECF, cholelithiasis Surgeries / Procedures: none since admit  Central access: PICC removed 6/12; replaced 6/16 TPN  start date: chronic TPN  Nutritional Goals (Chronic TPN formula, per [facility] staff on 7/2): kCal: 1472 kCal, Protein (concentrated AA): 90g, CHO 180g, ILE 50g, no insulin Infuse 1800 mL over 14 hrs  RD goals, updated on 7/7: 1600-1800 kCal, 75-90g protein, > 1.6L fluid per day  Current Nutrition:  HH/CMD - eating ~10-15% meals per RN 7/11 Boost TID between meals - Refused all doses yesterday. Per patient, she was sent the wrong flavor - wants "peach fruit" and received "red raspberry." Pharmacy adjusted order to state flavor preference and spoke with RN.  Plan:  Advance to cyclic TPN over 16 hrs at 1800 with CBGs controlled <200 and electrolytes stable. Cycle 1800 mL over 16 hrs - 60 mL/hr x 1 hr; then 120 mL/hr x 14 hrs; then 60 mL/hr x 1 hr. Will attempt further advancement of cyclic TPN tomorrow if patient tolerates. TPN provides 90 g AA, 216 g CHO, and 49 g ILE, for total 1580 kCal; meeting 100% of protein and 99% of kCal goals. Electrolytes in TPN: Continue same today - Na 90 mEq/L, K 20 mEq/L, Phos 10 mmol/L, and Mag 9mEq/L, Ca removed, max acetate Add standard MVI and trace elements to TPN Adjust custom cyclic TPN 4x sensitive SSI + remove insulin from TPN bag with CBGs trending low. Watch PO intake/CBG trend closely and adjust as needed. F/u AM TPN labs Monitor discharge plan - planning SNF or LTACH   Arturo Morton, PharmD, BCPS Please check AMION for all [place] contact numbers Clinical Pharmacist 09/21/2019 7:49 AM

## 2019-09-21 NOTE — Progress Notes (Signed)
Bp elevated, md made aware. PRN medication given as ordered

## 2019-09-21 NOTE — Progress Notes (Signed)
PROGRESS NOTE    Gina [name]  TMH:[id number] DOB: 01-16-1961 DOA: 09/13/2019 PCP: [name], MD    Brief Narrative:  59 year old with past medical history significant for perforated prepyloric ulcer in 2020 status post exploratory laparotomy with [name] patch, chronic TPN via right arm PICC line, insulin-dependent diabetes, history of bilateral lower quadrant drains along with enterocutaneous fistula with colostomy bag, chronic sacral pressure ulcer stage II, schizophrenia, bilateral BKA who presents for evaluation of fever.  She has been admitted a few times this year for sepsis including MRSA bacteremia, fungemia, Candida peritonitis in April, Proteus UTI, Candida glabrata who completed Eraxis 6/26.  Presents with persistent fevers and low hemoglobin. Patient reported that she was sent to the ED due to low hemoglobin.  Patient was sent from PCP office due to hypotension, systolic blood pressure in the 60s, patient had an episode of nausea and vomiting. In the ED her hemoglobin was stable around 7.7, hyponatremia, hyperglycemia.  Fecal occult was noted to be positive.   Assessment & Plan:   Principal Problem:   Acute GI bleeding Active Problems:   Hyperglycemia (glucose 203 on admission)   HTN (hypertension)   Type 2 diabetes mellitus with other circulatory complications (HCC)   Severe protein-calorie malnutrition (HCC)   Anemia of chronic disease   Schizophrenia (HCC)   Sacral decubitus ulcer, stage II   Below-knee amputee (HCC)   Intra-abdominal fluid collection, chronic   S/P BKA (below knee amputation) bilateral (HCC)   Enterocutaneous fistula   Fever   Iron deficiency anemia due to chronic blood loss   Hypoalbuminemia   On total parenteral nutrition (TPN)   PVD (peripheral vascular disease) (HCC) s/p bilateral BKAs   Hypotension   Hyponatremia (Sodium 131 on admission)   PUD (peptic ulcer disease) with history of perforated gastric ulcer s/p [name] patch   Chronic pain  syndrome   Proteinuria   Hypomagnesemia   Hypophosphatemia   Stage 4 pressure ulcer ([place]) of bilateral coccyx   Pressure ulcer, unstageable ([place]) to right distal knee, left distal knee   Pressure ulcer of left mid thigh, stage 2 (HCC)   Pressure injury, stage 3 ([place]) to mid sacrum, left leg and right leg   Underweight   Encounter for blood transfusion on 09/13/19   Acute urinary retention   Hypoglycemia  Fever of unknown origin, present on admission: Repeat blood cultures, urinalysis and urine cultures negative. Infectious disease consulted, patient was restarted on Eraxis that she received for 2 days and that was discontinued. No evidence of recurrent fever or localization of infection.  Monitoring off antibiotics.  Afebrile now. CT scan abdomen pelvis chronic intra-abdominal collection, enterocutaneous fistula. Symptomatic treatment.  Anemia: Iron deficiency with likely acute on chronic anemia.  Hemoccult positive stool present on admission.  Suspect chronic GI bleeding. Hemoglobin 6.6 - 1 unit of PRBC-  8.5-7.4-7-6.6- 1 unit PRBC -8.5-7.5.  We will continue to closely monitor. Continue IV Protonix, will change to p.o. Protonix on discharge. Previous provider discussed with Dr. Paulita Fujita with [facility] and they recommended symptomatic treatment. Received IV iron on 7/5.  History of Candida glabrata fungemia: Completed Eraxis on 6/26.  Hypotension: Due to hypovolemia.  Improved.  Type 2 diabetes, diet controlled: Currently on TPN and remains on sliding scale insulin. Insulin adjusted with TPN.  Sacral decubitus ulcer stage IV/left lower extremity stage II decubitus ulcer: Present on admission.  Local wound care.    Chronic pain due to bilateral below knee amputation: On gabapentin and oxycodone.  Acute kidney injury: Resolving.  No more urinary retention.  Nutrition Status: Nutrition Problem: Moderate Malnutrition Etiology: chronic illness (EC fistula, perforated prepyloric ulcer  on TPN) Signs/Symptoms: percent weight loss, moderate fat depletion, moderate muscle depletion Percent weight loss: 9 % (in 1 mo) Interventions: Refer to RD note for recommendations  On TPN, managed by pharmacy.  IV fluids, electrolyte replacement along with TPN protocol. Needing high complexity of care , will benefit with placement at nursing home.   DVT prophylaxis: SCD   Code Status: Limited code Family Communication: updated daughter 7/8 Disposition Plan: Status is: Inpatient  Remains inpatient appropriate because:Unsafe d/c plan, needs higher level of care. LTAC or SNF level of care    Dispo: The patient is from: Home              Anticipated d/c is to: [facility]/ [facility]               Anticipated d/c date is: 2 days              Patient currently is not medically stable to d/c. only can transfer to [facility] or skilled nursing facility.      Consultants:   Infectious disease, signed off  Gastroenterology, curbside  Procedures:   None  Antimicrobials:  Anti-infectives (From admission, onward)   Start     Dose/Rate Route Frequency Ordered Stop   09/13/19 1030  anidulafungin (ERAXIS) 100 mg in sodium chloride 0.9 % 100 mL IVPB  Status:  Discontinued        100 mg 78 mL/hr over 100 Minutes Intravenous Every 24 hours 09/12/19 0952 09/14/19 1230   09/12/19 1030  anidulafungin (ERAXIS) 200 mg in sodium chloride 0.9 % 200 mL IVPB  Status:  Discontinued        200 mg 78 mL/hr over 200 Minutes Intravenous  Once 09/12/19 0952 09/15/19 0903         Subjective: Seen and examined.  No overnight events.  Denies any nausea today. Objective: Vitals:   09/20/19 1946 09/21/19 0441 09/21/19 1100 09/21/19 1234  BP: (!) 150/80 (!) 156/79  (!) 196/95  Pulse: 78 72  82  Resp: 18 18  18   Temp: 98 F (36.7 C) 98.4 F (36.9 C) 98.2 F (36.8 C)   TempSrc: Oral Oral Oral   SpO2: 96% 95%    Weight:      Height:        Intake/Output Summary (Last 24 hours) at 09/21/2019 1324 Last data  filed at 09/21/2019 0442 Gross per 24 hour  Intake 360 ml  Output 1400 ml  Net -1040 ml   Filed Weights   09/20/2019 1521  Weight: 45.4 kg    Examination:  General exam: Appears calm and comfortable, chronically sick looking, sleepy. Respiratory system: Bilateral clear Cardiovascular system: No added sounds Gastrointestinal system: Soft nontender.  Enterocutaneous fistula with ostomy bag in place with Eakins patch, dark substance present..  Central nervous system: Alert and oriented. No focal neurological deficits. Bilateral BKA.   Data Reviewed: I have personally reviewed following labs and imaging studies  CBC: Recent Labs  Lab 09/15/19 0420 09/15/19 0420 09/16/19 0846 09/16/19 0846 09/18/19 0444 09/18/19 2030 09/19/19 0440 09/20/19 0430 09/21/19 0359  WBC 13.2*   < > 11.9*  --  13.1*  --  12.7* 8.9 7.7  NEUTROABS 10.0*  --   --   --   --   --   --   --   --   HGB 7.4*   < >  7.0*   < > 6.6* 8.7* 8.5* 7.7* 7.9*  HCT 23.3*   < > 22.1*   < > 20.9* 27.0* 26.2* 23.7* 24.6*  MCV 95.9   < > 94.4  --  92.9  --  94.2 91.9 93.2  PLT 331   < > 353  --  311  --  316 290 306   < > = values in this interval not displayed.   Basic Metabolic Panel: Recent Labs  Lab 09/17/19 0425 09/18/19 0444 09/19/19 0440 09/20/19 0430 09/21/19 0359  NA 132* 133* 132* 136 137  K 3.5 3.6 3.2* 3.1* 3.7  CL 106 107 107 108 104  CO2 18* 20* 19* 22 27  GLUCOSE 133* 199* 255* 158* 116*  BUN 35* 33* 39* 42* 48*  CREATININE 1.45* 1.14* 1.03* 0.94 0.77  CALCIUM 7.8* 7.8* 7.7* 7.7* 8.0*  MG 1.6* 2.1 2.1 1.9 2.1  PHOS 2.2* 2.9 3.4 2.8 2.8   GFR: Estimated Creatinine Clearance: 54.9 mL/min (by C-G formula based on SCr of 0.77 mg/dL). Liver Function Tests: Recent Labs  Lab 09/15/19 0420 09/16/19 0415 09/18/19 0444  AST 11* 12* 14*  ALT 9 8 12   ALKPHOS 73 82 93  BILITOT 0.7 0.6 0.1*  PROT 6.2* 6.3* 6.0*  ALBUMIN 1.5* 1.4* 1.3*   No results for input(s): LIPASE, AMYLASE in the last 168  hours. No results for input(s): AMMONIA in the last 168 hours. Coagulation Profile: No results for input(s): INR, PROTIME in the last 168 hours. Cardiac Enzymes: No results for input(s): CKTOTAL, CKMB, CKMBINDEX, TROPONINI in the last 168 hours. BNP (last 3 results) No results for input(s): PROBNP in the last 8760 hours. HbA1C: No results for input(s): HGBA1C in the last 72 hours. CBG: Recent Labs  Lab 09/20/19 1944 09/21/19 0013 09/21/19 0440 09/21/19 0818 09/21/19 1152  GLUCAP 182* 148* 106* 98 151*   Lipid Profile: No results for input(s): CHOL, HDL, LDLCALC, TRIG, CHOLHDL, LDLDIRECT in the last 72 hours. Thyroid Function Tests: No results for input(s): TSH, T4TOTAL, FREET4, T3FREE, THYROIDAB in the last 72 hours. Anemia Panel: No results for input(s): VITAMINB12, FOLATE, FERRITIN, TIBC, IRON, RETICCTPCT in the last 72 hours. Sepsis Labs: No results for input(s): PROCALCITON, LATICACIDVEN in the last 168 hours.  Recent Results (from the past 240 hour(s))  SARS Coronavirus 2 by RT PCR (hospital order, performed in [facility] hospital lab) Nasopharyngeal Nasopharyngeal Swab     Status: None   Collection Time: 09/23/2019 10:06 PM   Specimen: Nasopharyngeal Swab  Result Value Ref Range Status   SARS Coronavirus 2 NEGATIVE NEGATIVE Final    Comment: (NOTE) SARS-CoV-2 target nucleic acids are NOT DETECTED.  The SARS-CoV-2 RNA is generally detectable in upper and lower respiratory specimens during the acute phase of infection. The lowest concentration of SARS-CoV-2 viral copies this assay can detect is 250 copies / mL. A negative result does not preclude SARS-CoV-2 infection and should not be used as the sole basis for treatment or other patient management decisions.  A negative result may occur with improper specimen collection / handling, submission of specimen other than nasopharyngeal swab, presence of viral mutation(s) within the areas targeted by this assay, and  inadequate number of viral copies (<250 copies / mL). A negative result must be combined with clinical observations, patient history, and epidemiological information.  Fact Sheet for Patients:   StrictlyIdeas.no  Fact Sheet for Healthcare Providers: BankingDealers.co.za  This test is not yet approved or  cleared by the [place]  States FDA and has been authorized for detection and/or diagnosis of SARS-CoV-2 by FDA under an Emergency Use Authorization (EUA).  This EUA will remain in effect (meaning this test can be used) for the duration of the COVID-19 declaration under Section 564(b)(1) of the Act, 21 U.S.C. section 360bbb-3(b)(1), unless the authorization is terminated or revoked sooner.  Performed at [facility], [place] [address] [place], [place] [address]   Culture, blood (Routine X 2) w Reflex to ID Panel     Status: None   Collection Time: 09/12/19  4:05 AM   Specimen: BLOOD  Result Value Ref Range Status   Specimen Description BLOOD PICC LINE  Final   Special Requests   Final    BOTTLES DRAWN AEROBIC AND ANAEROBIC Blood Culture results may not be optimal due to an excessive volume of blood received in culture bottles   Culture   Final    NO GROWTH 5 DAYS Performed at [facility], [place] [address] [place], [place] [address]    Report Status 09/17/2019 FINAL  Final  Urine Culture     Status: Abnormal   Collection Time: 09/12/19  8:36 AM   Specimen: Urine, Random  Result Value Ref Range Status   Specimen Description URINE, RANDOM  Final   Special Requests   Final    NONE Performed at [facility], [place] [address], [place], [place] [address]    Culture MULTIPLE SPECIES PRESENT, SUGGEST RECOLLECTION (A)  Final   Report Status 09/13/2019 FINAL  Final  Culture, blood (Routine X 2) w Reflex to ID Panel     Status: None   Collection Time: 09/12/19 12:14 PM   Specimen: BLOOD  Result Value Ref Range Status    Specimen Description BLOOD LEFT ANTECUBITAL  Final   Special Requests   Final    BOTTLES DRAWN AEROBIC AND ANAEROBIC Blood Culture results may not be optimal due to an inadequate volume of blood received in culture bottles   Culture   Final    NO GROWTH 5 DAYS Performed at [facility], [place] [address], [place], [place] [address]    Report Status 09/17/2019 FINAL  Final  Urine Culture     Status: None   Collection Time: 09/15/19  8:03 AM   Specimen: Urine, Random  Result Value Ref Range Status   Specimen Description URINE, RANDOM  Final   Special Requests NONE  Final   Culture   Final    NO GROWTH Performed at [facility], [place] [address], [place], [place] [address]    Report Status 09/16/2019 FINAL  Final         Radiology Studies: No results found.      Scheduled Meds: . Chlorhexidine Gluconate Cloth  6 each Topical Daily  . cholestyramine  4 g Oral BID  . collagenase   Topical Daily  . cyanocobalamin  1,000 mcg Intramuscular Once  . feeding supplement  1 Container Oral TID BM  . gabapentin  300 mg Oral BID  . insulin aspart  0-9 Units Subcutaneous 4 times per day  . metoprolol tartrate  50 mg Oral BID  . nystatin  5 mL Oral QID  . pantoprazole  40 mg Intravenous Q12H  . sodium chloride flush  10-40 mL Intracatheter Q12H   Continuous Infusions: . TPN CYCLIC-ADULT (ION)       LOS: 9 days    Time spent: 25 minutes     Barb Merino,  MD [facility] Pager [phone]

## 2019-09-22 LAB — DIFFERENTIAL
Abs Immature Granulocytes: 0.06 10*3/uL (ref 0.00–0.07)
Basophils Absolute: 0 10*3/uL (ref 0.0–0.1)
Basophils Relative: 1 %
Eosinophils Absolute: 0.1 10*3/uL (ref 0.0–0.5)
Eosinophils Relative: 1 %
Immature Granulocytes: 1 %
Lymphocytes Relative: 23 %
Lymphs Abs: 1.4 10*3/uL (ref 0.7–4.0)
Monocytes Absolute: 0.7 10*3/uL (ref 0.1–1.0)
Monocytes Relative: 12 %
Neutro Abs: 3.9 10*3/uL (ref 1.7–7.7)
Neutrophils Relative %: 62 %

## 2019-09-22 LAB — GLUCOSE, CAPILLARY
Glucose-Capillary: 108 mg/dL — ABNORMAL HIGH (ref 70–99)
Glucose-Capillary: 172 mg/dL — ABNORMAL HIGH (ref 70–99)
Glucose-Capillary: 177 mg/dL — ABNORMAL HIGH (ref 70–99)
Glucose-Capillary: 188 mg/dL — ABNORMAL HIGH (ref 70–99)
Glucose-Capillary: 192 mg/dL — ABNORMAL HIGH (ref 70–99)
Glucose-Capillary: 243 mg/dL — ABNORMAL HIGH (ref 70–99)
Glucose-Capillary: 298 mg/dL — ABNORMAL HIGH (ref 70–99)

## 2019-09-22 LAB — CBC
HCT: 24 % — ABNORMAL LOW (ref 36.0–46.0)
Hemoglobin: 7.8 g/dL — ABNORMAL LOW (ref 12.0–15.0)
MCH: 30.6 pg (ref 26.0–34.0)
MCHC: 32.5 g/dL (ref 30.0–36.0)
MCV: 94.1 fL (ref 80.0–100.0)
Platelets: 317 10*3/uL (ref 150–400)
RBC: 2.55 MIL/uL — ABNORMAL LOW (ref 3.87–5.11)
RDW: 16.4 % — ABNORMAL HIGH (ref 11.5–15.5)
WBC: 6.2 10*3/uL (ref 4.0–10.5)
nRBC: 0 % (ref 0.0–0.2)

## 2019-09-22 LAB — MAGNESIUM: Magnesium: 2 mg/dL (ref 1.7–2.4)

## 2019-09-22 LAB — PREALBUMIN: Prealbumin: 8.5 mg/dL — ABNORMAL LOW (ref 18–38)

## 2019-09-22 LAB — COMPREHENSIVE METABOLIC PANEL
ALT: 13 U/L (ref 0–44)
AST: 15 U/L (ref 15–41)
Albumin: 1.3 g/dL — ABNORMAL LOW (ref 3.5–5.0)
Alkaline Phosphatase: 97 U/L (ref 38–126)
Anion gap: 6 (ref 5–15)
BUN: 45 mg/dL — ABNORMAL HIGH (ref 6–20)
CO2: 30 mmol/L (ref 22–32)
Calcium: 8 mg/dL — ABNORMAL LOW (ref 8.9–10.3)
Chloride: 103 mmol/L (ref 98–111)
Creatinine, Ser: 0.74 mg/dL (ref 0.44–1.00)
GFR calc Af Amer: >60 mL/min (ref >60)
GFR calc non Af Amer: >60 mL/min (ref >60)
Glucose, Bld: 196 mg/dL — ABNORMAL HIGH (ref 70–99)
Potassium: 3.7 mmol/L (ref 3.5–5.1)
Sodium: 139 mmol/L (ref 135–145)
Total Bilirubin: 0.5 mg/dL (ref 0.3–1.2)
Total Protein: 6 g/dL — ABNORMAL LOW (ref 6.5–8.1)

## 2019-09-22 LAB — PHOSPHORUS: Phosphorus: 3 mg/dL (ref 2.5–4.6)

## 2019-09-22 LAB — TRIGLYCERIDES: Triglycerides: 123 mg/dL (ref <150)

## 2019-09-22 MED ORDER — TRACE MINERALS CU-MN-SE-ZN 300-55-60-3000 MCG/ML IV SOLN: INTRAVENOUS | Status: DC

## 2019-09-22 MED ORDER — TRACE MINERALS CU-MN-SE-ZN 300-55-60-3000 MCG/ML IV SOLN
INTRAVENOUS | Status: AC
  Filled 2019-09-22: qty 600

## 2019-09-22 MED ORDER — AMLODIPINE BESYLATE 5 MG PO TABS
5.0000 mg | ORAL_TABLET | Freq: Every day | ORAL | Status: DC
  Administered 2019-09-22 – 2019-09-25 (×4): 5 mg via ORAL
  Filled 2019-09-22 (×4): qty 1

## 2019-09-22 NOTE — Progress Notes (Signed)
PROGRESS NOTE    Gina [name]  XBJ:[id number] DOB: 04-12-1960 DOA: 10/06/2019 PCP: [name], MD    Brief Narrative:  59 year old with past medical history significant for perforated prepyloric ulcer in 2020 status post exploratory laparotomy with [name] patch, chronic TPN via right arm PICC line, insulin-dependent diabetes, history of bilateral lower quadrant drains along with enterocutaneous fistula with colostomy bag, chronic sacral pressure ulcer stage II, schizophrenia, bilateral BKA who presents for evaluation of fever.  She has been admitted a few times this year for sepsis including MRSA bacteremia, fungemia, Candida peritonitis in April, Proteus UTI, Candida glabrata who completed Eraxis 6/26.  Presents with persistent fevers and low hemoglobin. Patient reported that she was sent to the ED due to low hemoglobin.  Patient was sent from PCP office due to hypotension, systolic blood pressure in the 60s, patient had an episode of nausea and vomiting. In the ED her hemoglobin was stable around 7.7, hyponatremia, hyperglycemia.  Fecal occult was noted to be positive.   Assessment & Plan:   Principal Problem:   Acute GI bleeding Active Problems:   Hyperglycemia (glucose 203 on admission)   HTN (hypertension)   Type 2 diabetes mellitus with other circulatory complications (HCC)   Severe protein-calorie malnutrition (HCC)   Anemia of chronic disease   Schizophrenia (HCC)   Sacral decubitus ulcer, stage II   Below-knee amputee (HCC)   Intra-abdominal fluid collection, chronic   S/P BKA (below knee amputation) bilateral (HCC)   Enterocutaneous fistula   Fever   Iron deficiency anemia due to chronic blood loss   Hypoalbuminemia   On total parenteral nutrition (TPN)   PVD (peripheral vascular disease) (HCC) s/p bilateral BKAs   Hypotension   Hyponatremia (Sodium 131 on admission)   PUD (peptic ulcer disease) with history of perforated gastric ulcer s/p [name] patch   Chronic pain  syndrome   Proteinuria   Hypomagnesemia   Hypophosphatemia   Stage 4 pressure ulcer ([place]) of bilateral coccyx   Pressure ulcer, unstageable ([place]) to right distal knee, left distal knee   Pressure ulcer of left mid thigh, stage 2 (HCC)   Pressure injury, stage 3 ([place]) to mid sacrum, left leg and right leg   Underweight   Encounter for blood transfusion on 09/13/19   Acute urinary retention   Hypoglycemia  Fever of unknown origin, present on admission:  Repeat blood cultures, urinalysis and urine cultures negative. Infectious disease consulted, patient was restarted on Eraxis that she received for 2 days and that was discontinued. No evidence of recurrent fever or localization of infection.  Monitoring off antibiotics.  Afebrile now. CT scan abdomen pelvis chronic intra-abdominal collection, enterocutaneous fistula. Symptomatic treatment.  Anemia: Iron deficiency with likely acute on chronic anemia.  Hemoccult positive stool present on admission.  Suspect chronic GI bleeding. Hemoglobin 6.6 - 1 unit of PRBC-  8.5-7.4-7-6.6- 1 unit PRBC -8.5-7.5.  We will continue to closely monitor. Continue IV Protonix, will change to p.o. Protonix on discharge. Previous provider discussed with Dr. Paulita Fujita with [facility] and they recommended symptomatic treatment. Received IV iron on 7/5.  History of Candida glabrata fungemia: Completed Eraxis on 6/26.  Hypotension: Due to hypovolemia.  Improved. Blood pressures improved and elevated now.  Start patient on amlodipine and hydralazine.  Is already on metoprolol.  Type 2 diabetes, diet controlled: Currently on TPN and remains on sliding scale insulin. Insulin adjusted with TPN.  Sacral decubitus ulcer stage IV/left lower extremity stage II decubitus ulcer: Present on admission.  Local wound care.    Chronic pain due to bilateral below knee amputation: On gabapentin and oxycodone.  Acute kidney injury: Resolving.  Foley catheter reinserted last  night.  Nutrition Status: Nutrition Problem: Moderate Malnutrition Etiology: chronic illness (EC fistula, perforated prepyloric ulcer on TPN) Signs/Symptoms: percent weight loss, moderate fat depletion, moderate muscle depletion Percent weight loss: 9 % (in 1 mo) Interventions: Refer to RD note for recommendations  On TPN, managed by pharmacy.  IV fluids, electrolyte replacement along with TPN protocol. Needing high complexity of care , will benefit with placement at nursing home.   DVT prophylaxis: SCD   Code Status: Limited code Family Communication: updated daughter 7/8 Disposition Plan: Status is: Inpatient  Remains inpatient appropriate because:Unsafe d/c plan, needs higher level of care. LTAC or SNF level of care    Dispo: The patient is from: Home              Anticipated d/c is to: [facility]/ [facility]               Anticipated d/c date is: Whenever bed ready.              Patient currently is not medically stable to d/c. only can transfer to [facility] or skilled nursing facility.  Patient cannot go home.  Does not have any support.  Waiting to go to some facilities that can manage the Eakin's patch and TPN.      Consultants:   Infectious disease, signed off  Gastroenterology, curbside  Procedures:   None  Antimicrobials:  Anti-infectives (From admission, onward)   Start     Dose/Rate Route Frequency Ordered Stop   09/13/19 1030  anidulafungin (ERAXIS) 100 mg in sodium chloride 0.9 % 100 mL IVPB  Status:  Discontinued        100 mg 78 mL/hr over 100 Minutes Intravenous Every 24 hours 09/12/19 0952 09/14/19 1230   09/12/19 1030  anidulafungin (ERAXIS) 200 mg in sodium chloride 0.9 % 200 mL IVPB  Status:  Discontinued        200 mg 78 mL/hr over 200 Minutes Intravenous  Once 09/12/19 0952 09/15/19 0903         Subjective: Seen and examined.  No overnight events.  She was able to eat some food.  Denies any abdominal pain or nausea today.  Blood pressure is  elevated. Objective: Vitals:   09/21/19 2309 09/22/19 0149 09/22/19 0446 09/22/19 0518  BP: (!) 189/88 (!) 190/82 (!) 185/80 (!) 186/82  Pulse:   85   Resp:   18   Temp:   98 F (36.7 C)   TempSrc:   Oral   SpO2:   99%   Weight:      Height:        Intake/Output Summary (Last 24 hours) at 09/22/2019 1141 Last data filed at 09/22/2019 0600 Gross per 24 hour  Intake 2047.61 ml  Output 3200 ml  Net -1152.39 ml   Filed Weights   09/14/2019 1521  Weight: 45.4 kg    Examination:  General exam: Appears calm and comfortable, chronically sick looking, sleepy. Respiratory system: Bilateral clear Cardiovascular system: No added sounds Gastrointestinal system: Soft nontender.  Enterocutaneous fistula with ostomy bag in place with Eakins patch, dark substance present. Central nervous system: Alert and oriented. No focal neurological deficits. Bilateral BKA.   Data Reviewed: I have personally reviewed following labs and imaging studies  CBC: Recent Labs  Lab 09/18/19 0444 09/18/19 0444 09/18/19 2030 09/19/19 0440 09/20/19  0430 09/21/19 0359 09/22/19 0333  WBC 13.1*  --   --  12.7* 8.9 7.7 6.2  NEUTROABS  --   --   --   --   --   --  3.9  HGB 6.6*   < > 8.7* 8.5* 7.7* 7.9* 7.8*  HCT 20.9*   < > 27.0* 26.2* 23.7* 24.6* 24.0*  MCV 92.9  --   --  94.2 91.9 93.2 94.1  PLT 311  --   --  316 290 306 317   < > = values in this interval not displayed.   Basic Metabolic Panel: Recent Labs  Lab 09/18/19 0444 09/19/19 0440 09/20/19 0430 09/21/19 0359 09/22/19 0333  NA 133* 132* 136 137 139  K 3.6 3.2* 3.1* 3.7 3.7  CL 107 107 108 104 103  CO2 20* 19* 22 27 30   GLUCOSE 199* 255* 158* 116* 196*  BUN 33* 39* 42* 48* 45*  CREATININE 1.14* 1.03* 0.94 0.77 0.74  CALCIUM 7.8* 7.7* 7.7* 8.0* 8.0*  MG 2.1 2.1 1.9 2.1 2.0  PHOS 2.9 3.4 2.8 2.8 3.0   GFR: Estimated Creatinine Clearance: 54.9 mL/min (by C-G formula based on SCr of 0.74 mg/dL). Liver Function Tests: Recent Labs   Lab 09/16/19 0415 09/18/19 0444 09/22/19 0333  AST 12* 14* 15  ALT 8 12 13   ALKPHOS 82 93 97  BILITOT 0.6 0.1* 0.5  PROT 6.3* 6.0* 6.0*  ALBUMIN 1.4* 1.3* 1.3*   No results for input(s): LIPASE, AMYLASE in the last 168 hours. No results for input(s): AMMONIA in the last 168 hours. Coagulation Profile: No results for input(s): INR, PROTIME in the last 168 hours. Cardiac Enzymes: No results for input(s): CKTOTAL, CKMB, CKMBINDEX, TROPONINI in the last 168 hours. BNP (last 3 results) No results for input(s): PROBNP in the last 8760 hours. HbA1C: No results for input(s): HGBA1C in the last 72 hours. CBG: Recent Labs  Lab 09/21/19 1152 09/21/19 2015 09/22/19 0008 09/22/19 0133 09/22/19 0734  GLUCAP 151* 139* 172* 188* 243*   Lipid Profile: Recent Labs    09/22/19 0333  TRIG 123   Thyroid Function Tests: No results for input(s): TSH, T4TOTAL, FREET4, T3FREE, THYROIDAB in the last 72 hours. Anemia Panel: No results for input(s): VITAMINB12, FOLATE, FERRITIN, TIBC, IRON, RETICCTPCT in the last 72 hours. Sepsis Labs: No results for input(s): PROCALCITON, LATICACIDVEN in the last 168 hours.  Recent Results (from the past 240 hour(s))  Culture, blood (Routine X 2) w Reflex to ID Panel     Status: None   Collection Time: 09/12/19 12:14 PM   Specimen: BLOOD  Result Value Ref Range Status   Specimen Description BLOOD LEFT ANTECUBITAL  Final   Special Requests   Final    BOTTLES DRAWN AEROBIC AND ANAEROBIC Blood Culture results may not be optimal due to an inadequate volume of blood received in culture bottles   Culture   Final    NO GROWTH 5 DAYS Performed at [facility], [place] [address], [place], [place] [address]    Report Status 09/17/2019 FINAL  Final  Urine Culture     Status: None   Collection Time: 09/15/19  8:03 AM   Specimen: Urine, Random  Result Value Ref Range Status   Specimen Description URINE, RANDOM  Final   Special Requests NONE  Final    Culture   Final    NO GROWTH Performed at [facility], [place] [address] [place], [place] [address]  Report Status 09/16/2019 FINAL  Final         Radiology Studies: No results found.      Scheduled Meds: . amLODipine  5 mg Oral Daily  . Chlorhexidine Gluconate Cloth  6 each Topical Daily  . cholestyramine  4 g Oral BID  . collagenase   Topical Daily  . cyanocobalamin  1,000 mcg Intramuscular Once  . feeding supplement  1 Container Oral TID BM  . gabapentin  300 mg Oral BID  . insulin aspart  0-9 Units Subcutaneous 4 times per day  . metoprolol tartrate  50 mg Oral BID  . nystatin  5 mL Oral QID  . pantoprazole  40 mg Intravenous Q12H  . sodium chloride flush  10-40 mL Intracatheter Q12H   Continuous Infusions: . TPN CYCLIC-ADULT (ION)       LOS: 10 days    Time spent: 25 minutes     Barb Merino, MD [facility] Pager [phone]

## 2019-09-22 NOTE — Progress Notes (Addendum)
PHARMACY - TOTAL PARENTERAL NUTRITION CONSULT NOTE  Indication:  EC fistula  Patient Measurements: Height: 5\' 2"  (157.5 cm) Weight: 45.4 kg (100 lb) IBW/kg (Calculated) : 50.1 TPN AdjBW (KG): 45.4 Body mass index is 18.29 kg/m. Usual Weight: 53-60 kg  Assessment:  30 YOF presents on 09/30/2019 with anemia, likely concurrent GIB. Patient has a history of perforated prepyloric ulcer s/p Delford Field.  She has an Chief Strategy Officer in place from January 2021 due to [facility] fistula and has been on TPN since 02/2019.  She was recently admitted with candidemia, resulting in PICC replacement.  Pharmacy consulted to continue TPN.  Patient reports not missing any TPN bags PTA. Per further discussion with MD on 7/2, held off on starting TPN initially due to fever and recent fungemia. TPN resumed 7/5. ID monitoring off antibiotics for now.  N/V noted overnight 7/9 (patient thinks from her nystatin started for oral thrush and has refused most doses since).  Glucose / Insulin: hx DM controlled by diet outpatient. Required insulin gtt last admit. CBGs 619-509 during cylic TPN with insulin removed 7/11. Utilized 5 units SSI / 24 hours.  Electrolytes: K 3.7. Cl 103. All electrolytes wnl.  Renal: AKI resolved. - SCr back down to 0.74 (BL 0.6-0.8), BUN 45. UOP 1.6 ml/kg/hr LFTs / TGs: LFTs / Tbili / TG WNL Prealbumin / albumin: Prealbumin increased to 8.5. Albumin 1.3 Intake / Output; MIVF: Drain output 1500 mL/24hrs; +2.5L this admit; MIVF d/c'd 7/9 GI Imaging: 7/2 CT abd/pelvis - similar extent, increased fullness of chronic intra-abdominal collection associated w/ known ECF, cholelithiasis Surgeries / Procedures: none since admit  Central access: PICC removed 6/12; replaced 6/16 TPN start date: chronic TPN  Nutritional Goals (Chronic TPN formula, per [facility] staff on 7/2): kCal: 1472 kCal, Protein (concentrated AA): 90g, CHO 180g, ILE 50g, no insulin Infuse 1800 mL over 14 hrs  RD goals, updated on 7/7:  1600-1800 kCal, 75-90g protein, > 1.6L fluid per day  Current Nutrition:  HH/CMD - eating 10-15% of breakfast per RN 7/12 and pt reports eating 30-40% Boost TID between meals - Patient refused once yesterday but reason unknown.  Patient previously refused due to flavor. Patient was drinking Boost with breakfast.   Plan:  Continue cyclic TPN over 16 hrs at 1800 with CBGs controlled >200. Cycle 1800 mL over 16 hrs - 60 mL/hr x 1 hr; then 120 mL/hr x 14 hrs; then 60 mL/hr x 1 hr. Will attempt further advancement of cyclic TPN tomorrow if patient tolerates. TPN provides 90 g AA, 216 g CHO, and 49 g ILE, for total 1580 kCal; meeting 100% of protein and 99% of kCal goals. Adjusted electrolytes in TPN: K increased to 25 mEq/L, max acetate adjusted to 1:2  Continued electrolytes in TPN: Na 90 mEq/L,  Phos 10 mmol/L, and Mag 23mEq/L, Ca removed Add standard MVI and trace elements to TPN Continue custom cyclic TPN 4x sensitive SSI + add back 10 units insulin to TPN and follow up CBGs closely and oral consumption  Monitor TPN labs Monday/Thursday, repeat electrolytes tomorrow  Monitor discharge plan - planning SNF or LTACH   Cristela Felt, PharmD Clinical Pharmacist  09/22/2019 8:34 AM

## 2019-09-22 NOTE — TOC Progression Note (Signed)
Transition of Care [facility]) - Progression Note    Patient Details  Name: Gina [name] MRN: [id number] Date of Birth: 1960/05/09  Transition of Care [facility]) CM/SW [place], [place] Phone Number: 09/22/2019, 10:55 AM  Clinical Narrative:    Continued difficulty with locating SNF that accepts pt with TPN/Eakins pouch. Further referrals made, [facility] leadership aware. Pt has been declined by both LTACs as does not meet admissions criteria.    Expected Discharge Plan: [place] Barriers to Discharge: Continued Medical Work up, Ship broker, Other (comment), Awaiting State Approval (PASRR) (TPN needed)  Expected Discharge Plan and Services Expected Discharge Plan: [place] Living arrangements for the past 2 months: Apartment  Readmission Risk Interventions Readmission Risk Prevention Plan 09/16/2019 08/29/2019 08/19/2019  Post Dischage Appt - - -  Medication Screening - - -  Transportation Screening Complete Complete Complete  PCP follow-up - - -  PCP or Specialist Appt within 3-5 Days - - -  Not Complete comments - - -  HRI or [place] Work Consult for [place] Planning/Counseling - - -  SW consult not completed comments - - -  Palliative Care Screening - - -  Palliative Care Screening Not Complete Comments - - -  Medication Review ([place]) Referral to Pharmacy Complete Complete  PCP or Specialist appointment within 3-5 days of discharge Not Complete Complete -  PCP/Specialist Appt Not Complete comments disposition pending - -  HRI or Home Care Consult Complete Complete Complete  SW Recovery Care/Counseling Consult Complete Complete Complete  Palliative Care Screening Complete Complete Not Applicable  Skilled Nursing Facility Complete Complete Not Applicable  Some recent data might be hidden

## 2019-09-23 LAB — BASIC METABOLIC PANEL
Anion gap: 8 (ref 5–15)
BUN: 40 mg/dL — ABNORMAL HIGH (ref 6–20)
CO2: 29 mmol/L (ref 22–32)
Calcium: 7.8 mg/dL — ABNORMAL LOW (ref 8.9–10.3)
Chloride: 99 mmol/L (ref 98–111)
Creatinine, Ser: 0.71 mg/dL (ref 0.44–1.00)
GFR calc Af Amer: >60 mL/min (ref >60)
GFR calc non Af Amer: >60 mL/min (ref >60)
Glucose, Bld: 155 mg/dL — ABNORMAL HIGH (ref 70–99)
Potassium: 3.2 mmol/L — ABNORMAL LOW (ref 3.5–5.1)
Sodium: 136 mmol/L (ref 135–145)

## 2019-09-23 LAB — GLUCOSE, CAPILLARY
Glucose-Capillary: 151 mg/dL — ABNORMAL HIGH (ref 70–99)
Glucose-Capillary: 163 mg/dL — ABNORMAL HIGH (ref 70–99)
Glucose-Capillary: 190 mg/dL — ABNORMAL HIGH (ref 70–99)
Glucose-Capillary: 194 mg/dL — ABNORMAL HIGH (ref 70–99)
Glucose-Capillary: 199 mg/dL — ABNORMAL HIGH (ref 70–99)

## 2019-09-23 LAB — MAGNESIUM: Magnesium: 2.1 mg/dL (ref 1.7–2.4)

## 2019-09-23 LAB — PHOSPHORUS: Phosphorus: 3.5 mg/dL (ref 2.5–4.6)

## 2019-09-23 MED ORDER — TRACE MINERALS CU-MN-SE-ZN 300-55-60-3000 MCG/ML IV SOLN
INTRAVENOUS | Status: AC
  Filled 2019-09-23: qty 600

## 2019-09-23 MED ORDER — ESCITALOPRAM OXALATE 20 MG PO TABS
20.0000 mg | ORAL_TABLET | Freq: Every day | ORAL | Status: DC
  Administered 2019-09-23 – 2019-10-02 (×10): 20 mg via ORAL
  Filled 2019-09-23 (×11): qty 1

## 2019-09-23 MED ORDER — POTASSIUM CHLORIDE CRYS ER 20 MEQ PO TBCR
30.0000 meq | EXTENDED_RELEASE_TABLET | ORAL | Status: AC
  Administered 2019-09-23 (×2): 30 meq via ORAL
  Filled 2019-09-23 (×2): qty 1

## 2019-09-23 MED ORDER — INSULIN ASPART 100 UNIT/ML ~~LOC~~ SOLN
0.0000 [IU] | SUBCUTANEOUS | Status: DC
  Administered 2019-09-23 – 2019-09-24 (×4): 3 [IU] via SUBCUTANEOUS
  Administered 2019-09-24: 2 [IU] via SUBCUTANEOUS
  Administered 2019-09-24: 3 [IU] via SUBCUTANEOUS
  Administered 2019-09-25 – 2019-09-26 (×3): 2 [IU] via SUBCUTANEOUS
  Administered 2019-09-26: 8 [IU] via SUBCUTANEOUS
  Administered 2019-09-26: 2 [IU] via SUBCUTANEOUS
  Administered 2019-09-27 (×3): 5 [IU] via SUBCUTANEOUS

## 2019-09-23 NOTE — Progress Notes (Signed)
PROGRESS NOTE    Gina [name]  BSW:[id number] DOB: Aug 04, 1960 DOA: 10/10/2019 PCP: [name], MD    Brief Narrative:  59 year old with past medical history significant for perforated prepyloric ulcer in 2020 status post exploratory laparotomy with [name] patch, chronic TPN via right arm PICC line, insulin-dependent diabetes, history of bilateral lower quadrant drains along with enterocutaneous fistula with colostomy bag, chronic sacral pressure ulcer stage II, schizophrenia, bilateral BKA who presents for evaluation of fever.  She has been admitted a few times this year for sepsis including MRSA bacteremia, fungemia, Candida peritonitis in April, Proteus UTI, Candida glabrata who completed Eraxis 6/26.  Presents with persistent fevers and low hemoglobin. Patient reported that she was sent to the ED due to low hemoglobin.  Patient was sent from PCP office due to hypotension, systolic blood pressure in the 60s, patient had an episode of nausea and vomiting. In the ED her hemoglobin was stable around 7.7, hyponatremia, hyperglycemia.  Fecal occult was noted to be positive.  7/13, she lives at home and her children were taking turns to take care of her.  Now they cannot provide any care at home.  Unable to be accepted by multiple LTAC or skilled nursing facilities.  She does want to go home, however does not have any support.   Assessment & Plan:   Principal Problem:   Acute GI bleeding Active Problems:   Hyperglycemia (glucose 203 on admission)   HTN (hypertension)   Type 2 diabetes mellitus with other circulatory complications (HCC)   Severe protein-calorie malnutrition (HCC)   Anemia of chronic disease   Schizophrenia (HCC)   Sacral decubitus ulcer, stage II   Below-knee amputee (HCC)   Intra-abdominal fluid collection, chronic   S/P BKA (below knee amputation) bilateral (HCC)   Enterocutaneous fistula   Fever   Iron deficiency anemia due to chronic blood loss   Hypoalbuminemia    On total parenteral nutrition (TPN)   PVD (peripheral vascular disease) (HCC) s/p bilateral BKAs   Hypotension   Hyponatremia (Sodium 131 on admission)   PUD (peptic ulcer disease) with history of perforated gastric ulcer s/p [name] patch   Chronic pain syndrome   Proteinuria   Hypomagnesemia   Hypophosphatemia   Stage 4 pressure ulcer ([place]) of bilateral coccyx   Pressure ulcer, unstageable ([place]) to right distal knee, left distal knee   Pressure ulcer of left mid thigh, stage 2 (HCC)   Pressure injury, stage 3 ([place]) to mid sacrum, left leg and right leg   Underweight   Encounter for blood transfusion on 09/13/19   Acute urinary retention   Hypoglycemia  Fever of unknown origin, present on admission: Currently no evidence of infection. Repeat blood cultures, urinalysis and urine cultures negative. Infectious disease consulted, patient was restarted on Eraxis that she received for 2 days and that was discontinued. No evidence of recurrent fever or localization of infection.  Monitoring off antibiotics.  Afebrile now. CT scan abdomen pelvis chronic intra-abdominal collection, enterocutaneous fistula. Symptomatic treatment.  Anemia: Iron deficiency with likely acute on chronic anemia.  Hemoccult positive stool present on admission.  Suspect chronic GI bleeding. Hemoglobin 6.6 - 1 unit of PRBC-  8.5-7.4-7-6.6- 1 unit PRBC -8.5-7.5.  We will continue to closely monitor. Continue IV Protonix, will change to p.o. Protonix on discharge. Previous provider discussed with Dr. Paulita Fujita with [facility] and they recommended symptomatic treatment. Received IV iron on 7/5.  We will repeat 1 dose today.  History of Candida glabrata fungemia: Completed  Eraxis on 6/26.  Hypotension: Due to hypovolemia.  Improved. Blood pressures improved and elevated now.  Started patient on amlodipine and hydralazine.  Is already on metoprolol.  Type 2 diabetes, diet controlled: Currently on TPN and remains on sliding  scale insulin. Insulin adjusted with TPN.  Sacral decubitus ulcer stage IV/left lower extremity stage II decubitus ulcer: Present on admission.  Local wound care.    Chronic pain due to bilateral below knee amputation: On gabapentin and oxycodone.  Acute kidney injury: AKI resolved.  She had retained urine multiple times and now has a Foley in.  Continue Foley catheter until improvement of mobility.  Nutrition Status: Nutrition Problem: Moderate Malnutrition Etiology: chronic illness (EC fistula, perforated prepyloric ulcer on TPN) Signs/Symptoms: percent weight loss, moderate fat depletion, moderate muscle depletion Percent weight loss: 9 % (in 1 mo) Interventions: Refer to RD note for recommendations  On TPN, managed by pharmacy.  IV fluids, electrolyte replacement along with TPN protocol. Needing high complexity of care , will benefit with placement at nursing home.   DVT prophylaxis: SCD   Code Status: Limited code Family Communication: updated daughter 7/8 Disposition Plan: Status is: Inpatient  Remains inpatient appropriate because:Unsafe d/c plan, needs higher level of care. LTAC or SNF level of care    Dispo: The patient is from: Home              Anticipated d/c is to: LTAC/ [facility]               Anticipated d/c date is: Whenever bed ready.              Patient currently is not medically stable to d/c. only can transfer to [facility] or skilled nursing facility.  Patient cannot go home.  Does not have any support.  Waiting to go to some facilities that can manage the Eakin's patch and TPN.      Consultants:   Infectious disease, signed off  Gastroenterology, curbside  Procedures:   None  Antimicrobials:  Anti-infectives (From admission, onward)   Start     Dose/Rate Route Frequency Ordered Stop   09/13/19 1030  anidulafungin (ERAXIS) 100 mg in sodium chloride 0.9 % 100 mL IVPB  Status:  Discontinued        100 mg 78 mL/hr over 100 Minutes Intravenous Every 24  hours 09/12/19 0952 09/14/19 1230   09/12/19 1030  anidulafungin (ERAXIS) 200 mg in sodium chloride 0.9 % 200 mL IVPB  Status:  Discontinued        200 mg 78 mL/hr over 200 Minutes Intravenous  Once 09/12/19 6967 09/15/19 8938         Subjective: Patient seen and examined.  She wants to go home but she knows that she does not have support at home.  Some nausea today.  Does not want to eat any food.  Denies any abdominal pain. Objective: Vitals:   09/22/19 1520 09/22/19 2029 09/23/19 0505 09/23/19 0540  BP: (!) 191/84 (!) 186/83 (!) 194/87 (!) 162/79  Pulse: 74 74 75 76  Resp: 16 15 19 18   Temp: 99 F (37.2 C) 98.1 F (36.7 C) 98.4 F (36.9 C) 98.2 F (36.8 C)  TempSrc: Oral Oral Oral Oral  SpO2: 97% 98% 97% 97%  Weight:      Height:        Intake/Output Summary (Last 24 hours) at 09/23/2019 1207 Last data filed at 09/23/2019 1000 Gross per 24 hour  Intake 1317.28 ml  Output 2500 ml  Net -1182.72 ml   Filed Weights   09/27/2019 1521  Weight: 45.4 kg    Examination:  General exam: Appears calm and comfortable, chronically sick looking, frail lady. Respiratory system: Bilateral clear Cardiovascular system: No added sounds Gastrointestinal system: Soft nontender.  Enterocutaneous fistula with ostomy bag in place with Eakins patch, dark substance present. Central nervous system: Alert and oriented. No focal neurological deficits. Bilateral BKA.   Data Reviewed: I have personally reviewed following labs and imaging studies  CBC: Recent Labs  Lab 09/18/19 0444 09/18/19 0444 09/18/19 2030 09/19/19 0440 09/20/19 0430 09/21/19 0359 09/22/19 0333  WBC 13.1*  --   --  12.7* 8.9 7.7 6.2  NEUTROABS  --   --   --   --   --   --  3.9  HGB 6.6*   < > 8.7* 8.5* 7.7* 7.9* 7.8*  HCT 20.9*   < > 27.0* 26.2* 23.7* 24.6* 24.0*  MCV 92.9  --   --  94.2 91.9 93.2 94.1  PLT 311  --   --  316 290 306 317   < > = values in this interval not displayed.   Basic Metabolic  Panel: Recent Labs  Lab 09/19/19 0440 09/20/19 0430 09/21/19 0359 09/22/19 0333 09/23/19 0357  NA 132* 136 137 139 136  K 3.2* 3.1* 3.7 3.7 3.2*  CL 107 108 104 103 99  CO2 19* 22 27 30 29   GLUCOSE 255* 158* 116* 196* 155*  BUN 39* 42* 48* 45* 40*  CREATININE 1.03* 0.94 0.77 0.74 0.71  CALCIUM 7.7* 7.7* 8.0* 8.0* 7.8*  MG 2.1 1.9 2.1 2.0 2.1  PHOS 3.4 2.8 2.8 3.0 3.5   GFR: Estimated Creatinine Clearance: 54.9 mL/min (by C-G formula based on SCr of 0.71 mg/dL). Liver Function Tests: Recent Labs  Lab 09/18/19 0444 09/22/19 0333  AST 14* 15  ALT 12 13  ALKPHOS 93 97  BILITOT 0.1* 0.5  PROT 6.0* 6.0*  ALBUMIN 1.3* 1.3*   No results for input(s): LIPASE, AMYLASE in the last 168 hours. No results for input(s): AMMONIA in the last 168 hours. Coagulation Profile: No results for input(s): INR, PROTIME in the last 168 hours. Cardiac Enzymes: No results for input(s): CKTOTAL, CKMB, CKMBINDEX, TROPONINI in the last 168 hours. BNP (last 3 results) No results for input(s): PROBNP in the last 8760 hours. HbA1C: No results for input(s): HGBA1C in the last 72 hours. CBG: Recent Labs  Lab 09/22/19 1517 09/22/19 2019 09/22/19 2331 09/23/19 0208 09/23/19 1118  GLUCAP 192* 108* 177* 199* 163*   Lipid Profile: Recent Labs    09/22/19 0333  TRIG 123   Thyroid Function Tests: No results for input(s): TSH, T4TOTAL, FREET4, T3FREE, THYROIDAB in the last 72 hours. Anemia Panel: No results for input(s): VITAMINB12, FOLATE, FERRITIN, TIBC, IRON, RETICCTPCT in the last 72 hours. Sepsis Labs: No results for input(s): PROCALCITON, LATICACIDVEN in the last 168 hours.  Recent Results (from the past 240 hour(s))  Urine Culture     Status: None   Collection Time: 09/15/19  8:03 AM   Specimen: Urine, Random  Result Value Ref Range Status   Specimen Description URINE, RANDOM  Final   Special Requests NONE  Final   Culture   Final    NO GROWTH Performed at [place], [address], [place], [place] [address]    Report Status 09/16/2019 FINAL  Final         Radiology Studies: No results found.  Scheduled Meds: . amLODipine  5 mg Oral Daily  . Chlorhexidine Gluconate Cloth  6 each Topical Daily  . cholestyramine  4 g Oral BID  . collagenase   Topical Daily  . cyanocobalamin  1,000 mcg Intramuscular Once  . escitalopram  20 mg Oral Daily  . feeding supplement  1 Container Oral TID BM  . gabapentin  300 mg Oral BID  . insulin aspart  0-15 Units Subcutaneous Q4H  . metoprolol tartrate  50 mg Oral BID  . nystatin  5 mL Oral QID  . pantoprazole  40 mg Intravenous Q12H  . potassium chloride  30 mEq Oral Q4H  . sodium chloride flush  10-40 mL Intracatheter Q12H   Continuous Infusions: . TPN CYCLIC-ADULT (ION)       LOS: 11 days    Time spent: 25 minutes     Barb Merino, MD [facility] Pager [phone]

## 2019-09-23 NOTE — Progress Notes (Signed)
Nutrition Follow-up  DOCUMENTATION CODES:   Non-severe (moderate) malnutrition in context of chronic illness  INTERVENTION:   -TPN management per pharmacy -Continue Boost Breeze po TID, each supplement provides 250 kcal and 9 grams of protein -Continue PO diet for comfort  NUTRITION DIAGNOSIS:   Moderate Malnutrition related to chronic illness (EC fistula, perforated prepyloric ulcer on TPN) as evidenced by percent weight loss, moderate fat depletion, moderate muscle depletion.  Ongoing  GOAL:   Patient will meet greater than or equal to 90% of their needs  Met with TPN   MONITOR:   PO intake, Supplement acceptance, Skin, Weight trends, Labs, I & O's  REASON FOR ASSESSMENT:   Consult Assessment of nutrition requirement/status  ASSESSMENT:   59 y.o. female with history of perforated prepyloric ulcer in 2020 s/p  exploratory laparotomy with [name] patch, chronic TPN via right arm PICC, IDDM, bilateral lower quadrant drains along with enterocutaneous fistula with colostomy bag, Chronic sacral pressure ulcers stage II, HTN, schizophrenia, bilateral BKA, PVD s/p B/L BKA presents with fever and low hemoglobin. Fecal occult blood was noted to be positive.  7/5- TPN re-started  UOP: 1 L x 24 hours  Pt continues to consume a PO diet for comfort. She reports a poor appetite and has been eating poorly for several days; meal completion 0-10%. She is consuming Boost Breeze supplements 2-3 times daily. Due to [facility] fistula, it is difficult to determine how much pt is absorbing via PO route.   Currently receiving cyclic TPN, 0370 ml per day, which provides 1580 kcals and 90 grams protein, meeting 99% of estimated kcal needs and 100% of estimated protein needs.   Per TOC notes, SNF placement will be challenging due to chronic TPN and eakin pouch.   Labs reviewed: potassium 3.2  CBGs: 199-163 (inpatient orders for glycemic control are 0-15 units insulin aspart every 4 hours).    Medications include Questran, KCl.  Diet Order:   Diet Order            Diet heart healthy/carb modified Room service appropriate? Yes; Fluid consistency: Thin  Diet effective now                 EDUCATION NEEDS:   Not appropriate for education at this time  Skin:  Skin Assessment: Skin Integrity Issues: Skin Integrity Issues:: Stage IV Stage IV: bilateral coccyx  Last BM:  7/11 (Eakin pouch for EC fistula)  Height:   Ht Readings from Last 1 Encounters:  10/10/2019 _0  (1.575 m)    Weight:   Wt Readings from Last 1 Encounters:  09/13/2019 45.4 kg    Ideal Body Weight:  43.5 kg (adjusted for bilateral BKAs)  BMI:  21 (adjusted for bilateral BKAs)  Estimated Nutritional Needs:   Kcal:  1600-1800  Protein:  75-90 grams  Fluid:  > 1.6 L   Lucas Mallow, RD, LDN, CNSC Please refer to Amion for contact information.

## 2019-09-23 NOTE — Progress Notes (Addendum)
PHARMACY - TOTAL PARENTERAL NUTRITION CONSULT NOTE  Indication:  EC fistula  Patient Measurements: Height: 5\' 2"  (157.5 cm) Weight: 45.4 kg (100 lb) IBW/kg (Calculated) : 50.1 TPN AdjBW (KG): 45.4 Body mass index is 18.29 kg/m. Usual Weight: 53-60 kg  Assessment:  50 YOF presents on 09/15/2019 with anemia, likely concurrent GIB. Patient has a history of perforated prepyloric ulcer s/p Delford Field.  She has an Chief Strategy Officer in place from January 2021 due to [facility] fistula and has been on TPN since 02/2019.  She was recently admitted with candidemia, resulting in PICC replacement.  Pharmacy consulted to continue TPN.  Patient reports not missing any TPN bags PTA. Per further discussion with MD on 7/2, held off on starting TPN initially due to fever and recent fungemia. TPN resumed 7/5. ID monitoring off antibiotics for now.  N/V noted overnight 7/9 (patient thinks from her nystatin started for oral thrush and has refused most doses since).  Glucose / Insulin: Hx DM controlled by diet outpatient. Required insulin gtt last admit. CBGs 132-440 on cyclic TPN and 102-725 off TPN. Utilized 12 units SSI / 24 hours.  Electrolytes: K 3.2. Cl 99. Other electrolytes wnl.  Renal: AKI resolved. - SCr back down to 0.71 (BL 0.6-0.8), BUN 40. UOP 0.9 ml/kg/hr LFTs / TGs: LFTs / Tbili / TG WNL Prealbumin / albumin: Prealbumin increased to 8.5. Albumin 1.3 Intake / Output; MIVF: Drain output 1100 mL/24hrs; Positive 2L this admit; MIVF d/c'd 7/9 GI Imaging: 7/2 CT abd/pelvis - similar extent, increased fullness of chronic intra-abdominal collection associated w/ known ECF, cholelithiasis Surgeries / Procedures: none since admit  Central access: PICC removed 6/12; replaced 6/16 TPN start date: chronic TPN  Nutritional Goals (Chronic TPN formula, per [facility] staff on 7/2): kCal: 1472 kCal, Protein (concentrated AA): 90g, CHO 180g, ILE 50g, no insulin Infuse 1800 mL over 14 hrs  RD goals, updated on 7/7:  1600-1800 kCal, 75-90g protein, > 1.6L fluid per day  Current Nutrition:  HH/CMD - per patient only having a few bites of breakfast/lunch/dinner due to decrease appetite and nausea this morning  Boost TID between meals - Patient had all boost supplements yesterday and patient reports drinking almost all of it.   Plan:  Continue cyclic TPN over 16 hrs at 1800 with variable CBGs and try to control CBGs better and achieve more consistent po intake before adjusting  Cycle 1800 mL over 16 hrs - 60 mL/hr x 1 hr; then 120 mL/hr x 14 hrs; then 60 mL/hr x 1 hr. Will attempt further advancement of cyclic TPN tomorrow if patient tolerates. TPN provides 90 g AA, 216 g CHO, and 49 g ILE, for total 1580 kCal; meeting 100% of protein and 99% of kCal goals. Adjusted electrolytes in TPN: increase K to 40 mEq/L. Cl:Ac 1:1  Continued electrolytes in TPN: 90 mEq/L of Na, 0 mEq/L of Ca, 10 mEq/L of Mg, 10 mmol/L of phos.  Supplement 30 mEq x2 KCl Add standard MVI and trace elements to TPN Continue 10 units insulin in TPN and adjust to moderate SSI q4h and follow up CBGs closely and oral consumption - will be more conservative due to minimal intake to avoid hypoglycemia Monitor TPN labs Monday/Thursday, repeat electrolytes tomorrow  Monitor discharge plan - planning SNF or LTACH   Cristela Felt, PharmD Clinical Pharmacist  09/23/2019 8:10 AM

## 2019-09-23 NOTE — Progress Notes (Signed)
Physical Therapy Treatment Patient Details Name: Gina [name] MRN: [id number] DOB: 06-15-1960 Today's Date: 09/23/2019    History of Present Illness Pt is a 59 year old with past medical history significant for perforated prepyloric ulcer in 2020 status post exploratory laparotomy with [name] patch, chronic TPN via right arm PICC line, insulin-dependent diabetes, history off bilateral lower quadrant drains along with enterocutaneous fistula with colostomy bag, chronic sacral pressure ulcer stage II, schizophrenia, bilateral BKA who presents for evaluation of fever of unclear source. Also with anemia thought to be due to chronic GI bleed.     PT Comments    Pt supine in bed on arrival.  Pt required max encouragement for moving out of bed to recliner.  Pt's son in room and also provided encouragement.  Continue to recommend snf placement as she required max/total assistance +2.     Follow Up Recommendations  SNF     Equipment Recommendations  None recommended by PT    Recommendations for Other Services       Precautions / Restrictions Precautions Precautions: Fall Precaution Comments: colostomy, bilateral BKA Restrictions Weight Bearing Restrictions: No Other Position/Activity Restrictions: bilateral BKA    Mobility  Bed Mobility Overal bed mobility: Needs Assistance       Supine to sit: +2 for physical assistance;Max assist     General bed mobility comments: Pt required max +2 to move into sitting and back to edge of bed to prepare for AP scoot.  Transfers Overall transfer level: Needs assistance Equipment used: None Transfers: Comptroller transfers: Max assist;+2 physical assistance;+2 safety/equipment   General transfer comment: Use of bed pad to scoot posterior into recliner.  PTA supported trunk during scooting.  Ambulation/Gait Ambulation/Gait assistance:  (NT)               Stairs              Wheelchair Mobility    Modified Rankin (Stroke Patients Only)       Balance     Sitting balance-Leahy Scale: Poor                                      Cognition Arousal/Alertness: Awake/alert Behavior During Therapy: WFL for tasks assessed/performed Overall Cognitive Status: History of cognitive impairments - at baseline                                 General Comments: Appeared with in functional limits during session.      Exercises      General Comments        Pertinent Vitals/Pain Pain Assessment: Faces Faces Pain Scale: Hurts little more Pain Location: residual limbs and back Pain Descriptors / Indicators: Grimacing;Crying Pain Intervention(s): Monitored during session;Repositioned    Home Living                      Prior Function            PT Goals (current goals can now be found in the care plan section) Acute Rehab PT Goals Patient Stated Goal: To go back to bed. Potential to Achieve Goals: Good Progress towards PT goals: Progressing toward goals    Frequency    Min 2X/week      PT Plan Current plan remains appropriate    Co-evaluation  AM-PAC PT "6 Clicks" Mobility   Outcome Measure  Help needed turning from your back to your side while in a flat bed without using bedrails?: A Lot Help needed moving from lying on your back to sitting on the side of a flat bed without using bedrails?: Total Help needed moving to and from a bed to a chair (including a wheelchair)?: Total Help needed standing up from a chair using your arms (e.g., wheelchair or bedside chair)?: Total Help needed to walk in hospital room?: Total Help needed climbing 3-5 steps with a railing? : Total 6 Click Score: 7    End of Session Equipment Utilized During Treatment: Gait belt Activity Tolerance: Patient limited by lethargy;Patient limited by pain Patient left: in chair;with chair alarm set Nurse  Communication: Mobility status PT Visit Diagnosis: Other abnormalities of gait and mobility (R26.89);Muscle weakness (generalized) (M62.81)     Time: 5361-4431 PT Time Calculation (min) (ACUTE ONLY): 21 min  Charges:  $Therapeutic Activity: 8-22 mins                     [name] , PTA Acute Rehabilitation Services Pager [phone] Office [phone]     Gina [name] 09/23/2019, 3:17 PM

## 2019-09-24 LAB — MAGNESIUM: Magnesium: 2.3 mg/dL (ref 1.7–2.4)

## 2019-09-24 LAB — GLUCOSE, CAPILLARY
Glucose-Capillary: 136 mg/dL — ABNORMAL HIGH (ref 70–99)
Glucose-Capillary: 101 mg/dL — ABNORMAL HIGH (ref 70–99)
Glucose-Capillary: 117 mg/dL — ABNORMAL HIGH (ref 70–99)
Glucose-Capillary: 159 mg/dL — ABNORMAL HIGH (ref 70–99)
Glucose-Capillary: 180 mg/dL — ABNORMAL HIGH (ref 70–99)
Glucose-Capillary: 180 mg/dL — ABNORMAL HIGH (ref 70–99)

## 2019-09-24 LAB — BASIC METABOLIC PANEL
Anion gap: 6 (ref 5–15)
BUN: 44 mg/dL — ABNORMAL HIGH (ref 6–20)
CO2: 29 mmol/L (ref 22–32)
Calcium: 7.9 mg/dL — ABNORMAL LOW (ref 8.9–10.3)
Chloride: 101 mmol/L (ref 98–111)
Creatinine, Ser: 0.76 mg/dL (ref 0.44–1.00)
GFR calc Af Amer: >60 mL/min (ref >60)
GFR calc non Af Amer: >60 mL/min (ref >60)
Glucose, Bld: 145 mg/dL — ABNORMAL HIGH (ref 70–99)
Potassium: 4.8 mmol/L (ref 3.5–5.1)
Sodium: 136 mmol/L (ref 135–145)

## 2019-09-24 LAB — PHOSPHORUS: Phosphorus: 4 mg/dL (ref 2.5–4.6)

## 2019-09-24 MED ORDER — TAMSULOSIN HCL 0.4 MG PO CAPS
0.4000 mg | ORAL_CAPSULE | Freq: Every day | ORAL | Status: DC
  Administered 2019-09-24 – 2019-10-01 (×8): 0.4 mg via ORAL
  Filled 2019-09-24 (×8): qty 1

## 2019-09-24 MED ORDER — PROCHLORPERAZINE EDISYLATE 10 MG/2ML IJ SOLN
10.0000 mg | Freq: Four times a day (QID) | INTRAMUSCULAR | Status: DC | PRN
  Administered 2019-09-24 – 2019-10-02 (×9): 10 mg via INTRAVENOUS
  Filled 2019-09-24 (×10): qty 2

## 2019-09-24 MED ORDER — BETHANECHOL CHLORIDE 5 MG PO TABS
5.0000 mg | ORAL_TABLET | Freq: Three times a day (TID) | ORAL | Status: DC
  Administered 2019-09-24 – 2019-10-02 (×18): 5 mg via ORAL
  Filled 2019-09-24 (×29): qty 1

## 2019-09-24 MED ORDER — TRACE MINERALS CU-MN-SE-ZN 300-55-60-3000 MCG/ML IV SOLN
INTRAVENOUS | Status: AC
  Filled 2019-09-24: qty 600

## 2019-09-24 NOTE — Progress Notes (Signed)
PHARMACY - TOTAL PARENTERAL NUTRITION CONSULT NOTE  Indication:  EC fistula  Patient Measurements: Height: 5\' 2"  (157.5 cm) Weight: 45.4 kg (100 lb) IBW/kg (Calculated) : 50.1 TPN AdjBW (KG): 45.4 Body mass index is 18.29 kg/m. Usual Weight: 53-60 kg  Assessment:  38 YOF presents on 09/12/2019 with anemia, likely concurrent GIB. Patient has a history of perforated prepyloric ulcer s/p [place].  She has an Chief Strategy Officer in place from January 2021 due to [facility] fistula and has been on TPN since 02/2019.  She was recently admitted with candidemia, resulting in PICC replacement.  Pharmacy consulted to continue TPN.  Patient reports not missing any TPN bags PTA. Per further discussion with MD on 7/2, held off on starting TPN initially due to fever and recent fungemia. TPN resumed 7/5. ID monitoring off antibiotics for now.  N/V noted overnight 7/9 (patient thinks from her nystatin started for oral thrush and has refused most doses since).  Glucose / Insulin: Hx DM controlled by diet outpatient. Required insulin gtt last admit. CBGs 993-570 on cyclic TPN and [id number] off TPN. Utilized 11 units / 24 hours. Electrolytes: K 4.8 s/p 60 mEq KCl yesterday. Phos 4.0 - trending up. Mg 2.3. Other electrolytes wnl.  Renal: AKI resolved. - SCr back down to 0.76 (BL 0.6-0.8), BUN 44. UOP 1.1 ml/kg/hr LFTs / TGs: LFTs / Tbili / TG WNL Prealbumin / albumin: Prealbumin increased to 8.5. Albumin 1.3 Intake / Output; MIVF: Drain output 1000 mL/24hrs; Positive 1.6L this admit; MIVF d/c'd 7/9 GI Imaging: 7/2 CT abd/pelvis - similar extent, increased fullness of chronic intra-abdominal collection associated w/ known ECF, cholelithiasis Surgeries / Procedures: none since admit  Central access: PICC removed 6/12; replaced 6/16 TPN start date: chronic TPN  Nutritional Goals (Chronic TPN formula, per [facility] staff on 7/2): kCal: 1472 kCal, Protein (concentrated AA): 90g, CHO 180g, ILE 50g, no insulin Infuse 1800  mL over 14 hrs  RD goals, updated on 7/7: 1600-1800 kCal, 75-90g protein, > 1.6L fluid per day  Current Nutrition:  HH/CMD - per patient reports not eating much at all/none due to abdominal pain and nausea (1 emesis yesterday documented and patient reported 2-3) Boost TID between meals - Patient had all boost supplements yesterday and reports drinking all of it and liking it  Plan:  Adjust cyclic TPN over 14 hours at 1800 with CBGs < 200 Cycle 1800 mL over 14 hours - 69 mL/hr x 1 hr; then 138 mL/hr x 12 hrs; then 69 mL/hr x 1 hr  TPN provides 90 g AA, 216 g CHO, and 49 g ILE, for total 1580 kCal; meeting 100% of protein and 99% of kCal goals. Adjusted electrolytes in TPN: K decreased to 30 mEq/L, phos decreased to 5 mmol/L, Mg decreased to 5 mEq/L Continued electrolytes in TPN: 90 mEq/L of Na, 0 mEq/L of Ca. Cl:Ac 1:1 Add standard MVI and trace elements to TPN Continue 10 units insulin in TPN and continue moderate SSI q4h and follow up CBGs closely and oral consumption - will be more conservative due to minimal intake to avoid hypoglycemia Monitor TPN labs Monday/Thursday Monitor discharge plan - planning SNF or LTACH   Cristela Felt, PharmD Clinical Pharmacist  09/24/2019 8:46 AM

## 2019-09-24 NOTE — Progress Notes (Signed)
Occupational Therapy Treatment Patient Details Name: Gina [name] MRN: [id number] DOB: 1960/11/20 Today's Date: 09/24/2019    History of present illness Pt is a 59 year old with past medical history significant for perforated prepyloric ulcer in 2020 status post exploratory laparotomy with [name] patch, chronic TPN via right arm PICC line, insulin-dependent diabetes, history off bilateral lower quadrant drains along with enterocutaneous fistula with colostomy bag, chronic sacral pressure ulcer stage II, schizophrenia, bilateral BKA who presents for evaluation of fever of unclear source. Also with anemia thought to be due to chronic GI bleed.    OT comments  Pt seen for OT follow up with focus on therapeutic exercise to progress strength in preparation for BADL engagement. Issued pt level one theraband to complete exercises listed below. Pt began to complete long sitting in bed (max A to maintain). Once long sitting, pt with several bouts of emesis. Pt attempting to continue exercise despite emesis and needing cues to rest. RN staff notified. Theraband left in room tied to bed to increase engagement between therapy sessions. D/c recs remain appropriate. Will continue to follow.    Follow Up Recommendations  SNF;Supervision/Assistance - 24 hour    Equipment Recommendations  None recommended by OT    Recommendations for Other Services      Precautions / Restrictions Precautions Precautions: Fall Precaution Comments: colostomy, bilateral BKA Restrictions Weight Bearing Restrictions: No       Mobility Bed Mobility Overal bed mobility: Needs Assistance             General bed mobility comments: max A to come into long sitting in bed for ther ex  Transfers                      Balance                                           ADL either performed or assessed with clinical judgement   ADL                                          General ADL Comments: session focused on bil UE ther ex to improve engagement and independence in BADLs     Vision Patient Visual Report: No change from baseline     Perception     Praxis      Cognition Arousal/Alertness: Awake/alert Behavior During Therapy: WFL for tasks assessed/performed Overall Cognitive Status: History of cognitive impairments - at baseline Area of Impairment: Safety/judgement;Awareness;Problem solving                         Safety/Judgement: Decreased awareness of safety;Decreased awareness of deficits Awareness: Emergent Problem Solving: Slow processing;Requires verbal cues;Requires tactile cues General Comments: continues to have poor awareness of current situation and d/c plan. Pt attempting to continue through session despite several bouts of vomiting, needing safety cues to rest        Exercises Other Exercises Other Exercises: level one theraband x5 in shoulder flexion, elbow flexion, elbow extension. theraband tied to bed rail to encourage use   Shoulder Instructions       General Comments      Pertinent Vitals/ Pain       Pain Assessment: Faces Faces Pain Scale: Hurts  little more Pain Location: residual limbs and back Pain Descriptors / Indicators: Aching;Grimacing Pain Intervention(s): Monitored during session;Repositioned  Home Living                                          Prior Functioning/Environment              Frequency  Min 2X/week        Progress Toward Goals  OT Goals(current goals can now be found in the care plan section)  Progress towards OT goals: Progressing toward goals  Acute Rehab OT Goals Patient Stated Goal: reduce vomiting OT Goal Formulation: With patient Time For Goal Achievement: 09/27/19 Potential to Achieve Goals: [place] Discharge plan remains appropriate    Co-evaluation                 AM-PAC OT "6 Clicks" Daily Activity     Outcome  Measure   Help from another person eating meals?: A Little Help from another person taking care of personal grooming?: A Lot Help from another person toileting, which includes using toliet, bedpan, or urinal?: A Lot Help from another person bathing (including washing, rinsing, drying)?: A Lot Help from another person to put on and taking off regular upper body clothing?: A Lot Help from another person to put on and taking off regular lower body clothing?: A Lot 6 Click Score: 13    End of Session    OT Visit Diagnosis: Other abnormalities of gait and mobility (R26.89);Muscle weakness (generalized) (M62.81);Pain Pain - part of body:  (back)   Activity Tolerance Treatment limited secondary to medical complications (Comment);Other (comment) (n/v)   Patient Left in bed;with call bell/phone within reach   Nurse Communication Other (comment) (vomiting)        Time: 9379-0240 OT Time Calculation (min): 10 min  Charges: OT General Charges $OT Visit: 1 Visit OT Treatments $Therapeutic Exercise: 8-22 mins  Zenovia Jarred, MSOT, OTR/L Acute Rehabilitation Services [facility] Office Number: [phone] Pager: [phone]  Zenovia Jarred 09/24/2019, 5:34 PM

## 2019-09-24 NOTE — Plan of Care (Signed)
  Problem: Nutrition: Goal: Adequate nutrition will be maintained Outcome: Progressing   

## 2019-09-24 NOTE — Progress Notes (Signed)
[facility] PROGRESS NOTE  Gina [name] JQB:[id number] DOB: 1960/12/07 DOA: 09/23/2019 PCP: [name], MD  Status: Inpatient---Remains inpatient appropriate because:Ongoing diagnostic testing needed not appropriate for outpatient work up, Unsafe d/c plan and IV treatments appropriate due to intensity of illness or inability to take PO   Dispo: The patient is from: Home              Anticipated d/c is to: SNF              Anticipated d/c date is: > 3 days              Patient currently is medically stable to d/c.  On [name] barrier to discharge is SNF that is capable of managing chronic TPN as well as Eakin's pouch over EC fistula  HPI: 59 year old with past medical history significant for perforated prepyloric ulcer in 2020 status post exploratory laparotomy with [name] patch, chronic TPN via right arm PICC line, insulin-dependent diabetes, history of bilateral lower quadrant drains along with enterocutaneous fistula with colostomy bag, chronic sacral pressure ulcer stage II, schizophrenia, bilateral BKA who presents for evaluation of fever. She has been admitted a few times this year for sepsis including MRSA bacteremia, fungemia, Candida peritonitis in April, Proteus UTI, Candida glabrata who completed Eraxis 6/26. Presents with persistent fevers and low hemoglobin. Patient reported that she was sent to the ED due to low hemoglobin. Patient was sent from PCP office due to hypotension, systolic blood pressure in the 60s, patient had an episode of nausea and vomiting. In the ED her hemoglobin was stable around 7.7, hyponatremia, hyperglycemia. Fecal occult was noted to be positive.  7/13, she lives at home and her children were taking turns to take care of her.  Now they cannot provide any care at home.  Unable to be accepted by multiple LTAC or skilled nursing facilities.  She does want to go home, however does not have any support.  Subjective: Primarily complaining of buttock pain  while sitting up in chair.  States has not been up to chair long. Wants Foley catheter out but explained to patient that she apparently has a small decubitus that likely would benefit from continued use of Foley catheter but can reevaluate as wound heals. Patient very reluctant to go to SNF.  States "have escaped 1 of those places before".  She is aware that all of her family members that would care for her work and would not be able to provide 24/7 care.  Objective: Vitals:   09/24/19 0402 09/24/19 1211  BP: (!) 169/76 (!) 163/72  Pulse: 76 77  Resp: 18 16  Temp: 98.4 F (36.9 C) 98.4 F (36.9 C)  SpO2: 99% 96%    Intake/Output Summary (Last 24 hours) at 09/24/2019 1429 Last data filed at 09/24/2019 1100 Gross per 24 hour  Intake 2293.28 ml  Output 1550 ml  Net 743.28 ml   Filed Weights   10/07/2019 1521  Weight: 45.4 kg    Exam: Constitutional: NAD, calm, uncomfortable 2/2 buttock discomfort ENMT: Mucous membranes are moist..Normal dentition.  Neck: normal, supple, no masses, no thyromegaly Respiratory: clear to auscultation bilaterally, no wheezing, no crackles. Normal respiratory effort.  Air 96% sat Cardiovascular: Regular rate and rhythm, no murmurs / rubs / gallops. No extremity edema. 2+ pedal pulses. No carotid bruits.  Abdomen: no tenderness, no masses palpated. No hepatosplenomegaly. Bowel sounds positive.  Eakin's pouch in place over fistula attached to Foley drainage bag with thin feculent drainage  noted. Musculoskeletal: no clubbing / cyanosis. No joint deformity upper and lower extremities.  Lateral lower extremity amputations.  Good ROM, no contractures. Normal muscle tone.  Skin: no rashes, lesions, ulcers. No induration Neurologic: CN 2-12 grossly intact. Sensation intact, DTR normal. Strength 3-4/5 x all 4 extremities.  Psychiatric:  Alert and oriented x 3. Normal mood.    Assessment/Plan: Fever of unknown origin, present on admission:  History of Candida  glabrata fungemia Currently no evidence of infection. Repeat blood cultures, urinalysis and urine cultures negative. Infectious disease consulted, patient was restarted on Eraxis that she received for 2 days and that was discontinued as of\26 No evidence of recurrent fever or localization of infection.  Monitoring off antibiotics.  Afebrile now. CT scan abdomen pelvis chronic intra-abdominal collection, enterocutaneous fistula. Symptomatic treatment.  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 Hemoccult positive stool present on admission.  Suspect chronic GI bleeding. Hemoglobin 6.6 - 1 unit of PRBC-  8.5-7.4-7-6.6- 1 unit PRBC -8.5-7.5.  We will continue to closely monitor. Continue IV Protonix, will change to p.o. Protonix on discharge. Previous provider discussed with Dr. Paulita Fujita with [facility] and they recommended symptomatic treatment. Received IV iron on 7/5.   Was given a repeat dose on 7/13  Hypotension (resolved) Hypertension:  Etiology to hypotension 2/2 hypovolemia.     Continue metoprolol  Started patient on amlodipine and hydralazine.    Type 2 diabetes, diet controlled:  Currently on TPN and remains on sliding scale insulin with readings between 101 and 194 Insulin adjusted in TPN per pharmacy.  Sacral decubitus ulcer stage IV/left lower extremity stage II decubitus ulcer:  Present on admission.   Sacral coccyx ulcer Reevaluated by Randall RN on 7/14.  Wound now measures 3.1 x 1 x 0.2 cm with 448% slick dark pink bed.  No longer meets requirement of stage IV decubitus.   Santyl has been discontinued in small piece of Xeroform dressing placed into the gluteal fold wound with recommendations to cover with foam dressing and change daily. Continue air mattress.  Order placed for chair pad. Will discontinue Foley catheter since wound essentially healed Continue to follow left lower extremity decubitus  Chronic pain due to bilateral below knee amputation:  On gabapentin and oxycodone.  Acute kidney injury/history of urinary retention:  AKI resolved.   She had retained urine multiple times - Foley in but will try to discontinue on 7/14 and follow bladder scans for recurrent urinary retention We will also start Flomax and bethanechol   Nutrition Status: Nutrition Problem: Moderate Malnutrition Etiology: chronic illness (EC fistula, perforated prepyloric ulcer on TPN) Signs/Symptoms: percent weight loss, moderate fat depletion, moderate muscle depletion Percent weight loss: 9 % (in 1 mo) Interventions: Refer to RD note for recommendations **On TPN, managed by pharmacy.  IV fluids, electrolyte replacement along with TPN protocol. Needing high complexity of care , will benefit with placement at nursing home. Estimated body mass index is 18.29 kg/m as calculated from the following:   Height as of this encounter: 5\' 2"  (1.575 m).   Weight as of this encounter: 45.4 kg.    Data Reviewed: Basic Metabolic Panel: Recent  Labs  Lab 09/20/19 0430 09/21/19 0359 09/22/19 0333 09/23/19 0357 09/24/19 0313  NA 136 137 139 136 136  K 3.1* 3.7 3.7 3.2* 4.8  CL 108 104 103 99 101  CO2 22 27 30 29 29   GLUCOSE 158* 116* 196* 155* 145*  BUN 42* 48* 45* 40* 44*  CREATININE 0.94 0.77 0.74 0.71 0.76  CALCIUM 7.7* 8.0* 8.0* 7.8* 7.9*  MG 1.9 2.1 2.0 2.1 2.3  PHOS 2.8 2.8 3.0 3.5 4.0   Liver Function Tests: Recent Labs  Lab 09/18/19 0444 09/22/19 0333  AST 14* 15  ALT 12 13  ALKPHOS 93 97  BILITOT 0.1* 0.5  PROT 6.0* 6.0*  ALBUMIN 1.3* 1.3*   No results  for input(s): LIPASE, AMYLASE in the last 168 hours. No results for input(s): AMMONIA in the last 168 hours. CBC: Recent Labs  Lab 09/18/19 0444 09/18/19 0444 09/18/19 2030 09/19/19 0440 09/20/19 0430 09/21/19 0359 09/22/19 0333  WBC 13.1*  --   --  12.7* 8.9 7.7 6.2  NEUTROABS  --   --   --   --   --   --  3.9  HGB 6.6*   < > 8.7* 8.5* 7.7* 7.9* 7.8*  HCT 20.9*   < > 27.0* 26.2* 23.7* 24.6* 24.0*  MCV 92.9  --   --  94.2 91.9 93.2 94.1  PLT 311  --   --  316 290 306 317   < > = values in this interval not displayed.   Cardiac Enzymes: No results for input(s): CKTOTAL, CKMB, CKMBINDEX, TROPONINI in the last 168 hours. BNP (last 3 results) No results for input(s): BNP in the last 8760 hours.  ProBNP (last 3 results) No results for input(s): PROBNP in the last 8760 hours.  CBG: Recent Labs  Lab 09/23/19 2031 09/23/19 2341 09/24/19 0400 09/24/19 0734 09/24/19 1209  GLUCAP 190* 194* 136* 101* 117*    Recent Results (from the past 240 hour(s))  Urine Culture     Status: None   Collection Time: 09/15/19  8:03 AM   Specimen: Urine, Random  Result Value Ref Range Status   Specimen Description URINE, RANDOM  Final   Special Requests NONE  Final   Culture   Final    NO GROWTH Performed at [facility], [place] [address] [place], [place] [address]    Report Status 09/16/2019 FINAL  Final     Studies: No results found.  Scheduled Meds:  amLODipine  5 mg Oral Daily   Chlorhexidine Gluconate Cloth  6 each Topical Daily   cholestyramine  4 g Oral BID   cyanocobalamin  1,000 mcg Intramuscular Once   escitalopram  20 mg Oral Daily   feeding supplement  1 Container Oral TID BM   gabapentin  300 mg Oral BID   insulin aspart  0-15 Units Subcutaneous Q4H   metoprolol tartrate  50 mg Oral BID   nystatin  5 mL Oral QID   pantoprazole  40 mg Intravenous Q12H   sodium chloride flush  10-40 mL Intracatheter Q12H   Continuous Infusions:  TPN CYCLIC-ADULT  (ION)      Principal Problem:   Acute GI bleeding Active Problems:   Hyperglycemia (glucose 203 on admission)   HTN (hypertension)   Type 2 diabetes mellitus with other circulatory complications (HCC)   Severe protein-calorie malnutrition ([place])   Anemia of chronic disease   Schizophrenia ([place])   Sacral decubitus ulcer, stage II   Below-knee amputee ([place])  Intra-abdominal fluid collection, chronic   S/P BKA (below knee amputation) bilateral (HCC)   Enterocutaneous fistula   Fever   Iron deficiency anemia due to chronic blood loss   Hypoalbuminemia   On total parenteral nutrition (TPN)   PVD (peripheral vascular disease) (HCC) s/p bilateral BKAs   Hypotension   Hyponatremia (Sodium 131 on admission)   PUD (peptic ulcer disease) with history of perforated gastric ulcer s/p [name] patch   Chronic pain syndrome   Proteinuria   Hypomagnesemia   Hypophosphatemia   Stage 4 pressure ulcer (HCC) of bilateral coccyx   Pressure ulcer, unstageable ([place]) to right distal knee, left distal knee   Pressure ulcer of left mid thigh, stage 2 (HCC)   Pressure injury, stage 3 ([place]) to mid sacrum, left leg and right leg   Underweight   Encounter for blood transfusion on 09/13/19   Acute urinary retention   Hypoglycemia     Code Status: Partial: DNR except okay to use NIMV/BiPAP Family Communication: Patient only DVT prophylaxis: Low risk secondary to chronicity of current issues and bilateral lower extremity amputations therefore no indication for pharmacological DVT prophylaxis or SCDs   Consultants:  Infectious disease, signed off  Gastroenterology, curbside  Procedures:  None  Antibiotics: Anti-infectives (From admission, onward)   Start     Dose/Rate Route Frequency Ordered Stop   09/13/19 1030  anidulafungin (ERAXIS) 100 mg in sodium chloride 0.9 % 100 mL IVPB  Status:  Discontinued        100 mg 78 mL/hr over 100 Minutes Intravenous Every 24 hours 09/12/19 0952 09/14/19  1230   09/12/19 1030  anidulafungin (ERAXIS) 200 mg in sodium chloride 0.9 % 200 mL IVPB  Status:  Discontinued        200 mg 78 mL/hr over 200 Minutes Intravenous  Once 09/12/19 0952 09/15/19 8099        Time spent: [place] ANP  [facility] Pager [phone]. If 7PM-7AM, please contact night-coverage at [email or web address] 09/24/2019, 2:29 PM  LOS: 12 days

## 2019-09-24 NOTE — Consult Note (Signed)
WOC Nurse Consult Note: Patient receiving care in [place]. Reason for Consult: "Reported stage 4 sacral/coccyx decub- ? Appropriate for vac" Wound type: healing stage 3 to gluteal fold, not appropriate for VAC therapy, nor continued use of Santyl Pressure Injury POA: Yes Measurement: 3.1 cm x 1 cm x 0.2 cm Wound bed: 902% slick, dark pink Drainage (amount, consistency, odor) none Periwound: intact Dressing procedure/placement/frequency: I discontinued the Santyl as it is no longer needed.  Place a small piece of Xeroform Kellie Simmering [phone]) into the gluteal fold wound. Cover with a foam dressing.  Change daily. Patient has a low air loss mattress in use. Monitor the wound area(s) for worsening of condition such as: Signs/symptoms of infection,  Increase in size,  Development of or worsening of odor, Development of pain, or increased pain at the affected locations.  Notify the medical team if any of these develop.  Thank you for the consult.  [place] nurse will not follow at this time.  Please re-consult the [place] team if needed.  Val Riles, RN, MSN, CWOCN, CNS-BC, pager [phone]

## 2019-09-25 LAB — GLUCOSE, CAPILLARY
Glucose-Capillary: 109 mg/dL — ABNORMAL HIGH (ref 70–99)
Glucose-Capillary: 114 mg/dL — ABNORMAL HIGH (ref 70–99)
Glucose-Capillary: 120 mg/dL — ABNORMAL HIGH (ref 70–99)
Glucose-Capillary: 138 mg/dL — ABNORMAL HIGH (ref 70–99)
Glucose-Capillary: 143 mg/dL — ABNORMAL HIGH (ref 70–99)
Glucose-Capillary: 77 mg/dL (ref 70–99)
Glucose-Capillary: 91 mg/dL (ref 70–99)

## 2019-09-25 LAB — COMPREHENSIVE METABOLIC PANEL
ALT: 15 U/L (ref 0–44)
AST: 20 U/L (ref 15–41)
Albumin: 1.5 g/dL — ABNORMAL LOW (ref 3.5–5.0)
Alkaline Phosphatase: 96 U/L (ref 38–126)
Anion gap: 6 (ref 5–15)
BUN: 51 mg/dL — ABNORMAL HIGH (ref 6–20)
CO2: 29 mmol/L (ref 22–32)
Calcium: 7.9 mg/dL — ABNORMAL LOW (ref 8.9–10.3)
Chloride: 100 mmol/L (ref 98–111)
Creatinine, Ser: 0.79 mg/dL (ref 0.44–1.00)
GFR calc Af Amer: >60 mL/min (ref >60)
GFR calc non Af Amer: >60 mL/min (ref >60)
Glucose, Bld: 83 mg/dL (ref 70–99)
Potassium: 4.7 mmol/L (ref 3.5–5.1)
Sodium: 135 mmol/L (ref 135–145)
Total Bilirubin: 0.2 mg/dL — ABNORMAL LOW (ref 0.3–1.2)
Total Protein: 6.3 g/dL — ABNORMAL LOW (ref 6.5–8.1)

## 2019-09-25 LAB — PHOSPHORUS: Phosphorus: 3.9 mg/dL (ref 2.5–4.6)

## 2019-09-25 LAB — MAGNESIUM: Magnesium: 2.3 mg/dL (ref 1.7–2.4)

## 2019-09-25 MED ORDER — TRACE MINERALS CU-MN-SE-ZN 300-55-60-3000 MCG/ML IV SOLN
INTRAVENOUS | Status: AC
  Filled 2019-09-25: qty 600

## 2019-09-25 NOTE — Progress Notes (Signed)
[facility] PROGRESS NOTE  Gina [name] ALP:[id number] DOB: [birth year] DOA: 09/13/2019 PCP: Gina Serene, MD  Status: Inpatient---Remains inpatient appropriate because:Ongoing diagnostic testing needed not appropriate for outpatient work up, Unsafe d/c plan and IV treatments appropriate due to intensity of illness or inability to take PO   Dispo: The patient is from: Home              Anticipated d/c is to: SNF              Anticipated d/c date is: > 3 days              Patient currently is medically stable to d/c.  On Gina [name] barrier to discharge is SNF that is capable of managing chronic TPN as well as Eakin's pouch over EC fistula  Code Status: Partial: DNR except okay to use NIMV/BiPAP Family Communication: Patient only DVT prophylaxis: Low risk secondary to chronicity of current issues and bilateral lower extremity amputations therefore no indication for pharmacological DVT prophylaxis or SCDs  HPI: 59 year old with past medical history significant for perforated prepyloric ulcer in 2020 status post exploratory laparotomy with Gina [name] patch, chronic TPN via right arm PICC line, insulin-dependent diabetes, history of bilateral lower quadrant drains along with enterocutaneous fistula with colostomy bag, chronic sacral pressure ulcer stage II, schizophrenia, bilateral BKA who presents for evaluation of fever. She has been admitted a few times this year for sepsis including MRSA bacteremia, fungemia, Candida peritonitis in April, Proteus UTI, Candida glabrata who completed Eraxis 6/26. Presents with persistent fevers and low hemoglobin. Patient reported that she was sent to the ED due to low hemoglobin. Patient was sent from PCP office due to hypotension, systolic blood pressure in the 60s, patient had an episode of nausea and vomiting. In the ED her hemoglobin was stable around 7.7, hyponatremia, hyperglycemia. Fecal occult was noted to be positive.  7/13, she lives at home and her  children were taking turns to take care of her.  Now they cannot provide any care at home.  Unable to be accepted by multiple LTAC or skilled nursing facilities.  She does want to go home, however does not have any support.  Subjective:  Encouraged that sacral coccyx wound nearly healed Patient request to be discharged in time to attend August 7 wedding Patient informed that still requires SNF discharge due to family's inability to provide 24/7 care  Objective: Vitals:   09/25/19 0737 09/25/19 1156  BP: (!) 162/70 (!) 170/77  Pulse: 85 78  Resp: 18 18  Temp: 98.1 F (36.7 C) 98.8 F (37.1 C)  SpO2: 97% 98%    Intake/Output Summary (Last 24 hours) at 09/25/2019 1421 Last data filed at 09/25/2019 0602 Gross per 24 hour  Intake 2125.36 ml  Output 1375 ml  Net 750.36 ml   Filed Weights   09/30/2019 1521  Weight: 45.4 kg    Exam: Constitutional: NAD, calm, uncomfortable 2/2 buttock discomfort ENMT: Mucous membranes are moist.  Respiratory: clear to auscultation bilaterally. Normal respiratory effort.  Air 96% sat Cardiovascular: Regular rate and rhythm, no murmurs / rubs / gallops. No extremity edema. 2+ pedal pulses.  Abdomen: no tenderness, no masses palpated.  Sounds present.  TPN infusing for nutrition.  Eakin's pouch over EC fistula with feculent drainage to collection bag Genitourinary: Foley cath removed on 7/14 and patient voiding Musculoskeletal: no clubbing / cyanosis. No joint deformity upper and lower extremities.  bilateral lower extremity amputations.  Good ROM, no contractures. Normal muscle tone.  Skin: no rashes, lesions, ulcers. No induration Neurologic: CN 2-12 grossly intact. Sensation intact, DTR normal. Strength 3-4/5 x all 4 extremities.  Psychiatric:  Alert and oriented x 3. Normal mood.    Assessment/Plan: Fever of unknown origin, present on admission:  History of Candida glabrata fungemia Currently no evidence of infection. Repeat blood cultures,  urinalysis and urine cultures negative. Infectious disease consulted, patient was restarted on Eraxis that she received for 2 days and that was discontinued as of\26 No evidence of recurrent fever or localization of infection.  Monitoring off antibiotics.  Afebrile now. CT scan abdomen pelvis chronic intra-abdominal collection, enterocutaneous fistula. Symptomatic treatment.  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 Hemoccult positive stool present on admission.  Suspect chronic GI bleeding. Hemoglobin 6.6 - 1 unit of PRBC-  8.5-7.4-7-6.6- 1 unit PRBC -8.5-7.5.  We will continue to closely monitor. Continue IV Protonix, will change to p.o. Protonix on discharge. Previous provider discussed with Dr. Paulita [name] with [name] and they recommended symptomatic treatment. Received IV iron on 7/5.   Was given a repeat dose on 7/13  Hypotension (resolved) Hypertension:  Etiology to hypotension 2/2 hypovolemia.     Continue metoprolol  Started patient on amlodipine and hydralazine.    Type 2 diabetes, diet controlled:  Currently on TPN and remains on sliding scale insulin with readings between 101 and 194 Insulin adjusted in TPN per pharmacy.  Sacral decubitus ulcer stage IV/left lower extremity stage II decubitus ulcer:  Present on admission.   Sacral coccyx ulcer Reevaluated by Anderson RN on 7/14.  Wound now measures 3.1 x 1 x 0.2 cm with 161% slick dark pink bed.  No longer meets requirement of stage IV decubitus.  Santyl has been discontinued in small piece of Xeroform dressing placed into the  gluteal fold wound with recommendations to cover with foam dressing and change daily. Continue air mattress.  Order placed for chair pad. Foley catheter discontinued Continue to follow left lower extremity decubitus  Chronic pain due to bilateral below knee amputation:  On gabapentin and oxycodone.  Acute [name] injury/history of urinary retention:  AKI resolved.   She had retained urine multiple times - Foley in but will try to discontinue on 7/14 and follow bladder scans for recurrent urinary retention We will also start Flomax and bethanechol   Nutrition Status: Nutrition Problem: Moderate Malnutrition Etiology: chronic illness (EC fistula, perforated prepyloric ulcer on TPN) Signs/Symptoms: percent weight loss, moderate fat depletion, moderate muscle depletion Percent weight loss: 9 % (in 1 mo) Interventions: Refer to RD note for recommendations **On TPN, managed by pharmacy.  IV fluids, electrolyte replacement along with TPN protocol. Needing high complexity of care , will benefit with placement at nursing home. Estimated body mass index is 18.29 kg/m as calculated from the following:   Height as of this encounter: 5\' 2"  (1.575 m).   Weight as of this encounter: 45.4 kg.    Data Reviewed: Basic Metabolic Panel: Recent Labs  Lab 09/21/19 0359 09/22/19 0333 09/23/19 0357 09/24/19 0313 09/25/19 0440  NA 137 139 136 136 135  K 3.7 3.7 3.2* 4.8 4.7  CL 104 103 99  101 100  CO2 27 30 29 29 29   GLUCOSE 116* 196* 155* 145* 83  BUN 48* 45* 40* 44* 51*  CREATININE 0.77 0.74 0.71 0.76 0.79  CALCIUM 8.0* 8.0* 7.8* 7.9* 7.9*  MG 2.1 2.0 2.1 2.3 2.3  PHOS 2.8 3.0 3.5 4.0 3.9   Liver Function Tests: Recent Labs  Lab 09/22/19 0333 09/25/19 0440  AST 15 20  ALT 13 15  ALKPHOS 97 96  BILITOT 0.5 0.2*  PROT 6.0* 6.3*  ALBUMIN 1.3* 1.5*   No results for input(s): LIPASE, AMYLASE in the last 168 hours. No results for input(s): AMMONIA in the last 168  hours. CBC: Recent Labs  Lab 09/18/19 2030 09/19/19 0440 09/20/19 0430 09/21/19 0359 09/22/19 0333  WBC  --  12.7* 8.9 7.7 6.2  NEUTROABS  --   --   --   --  3.9  HGB 8.7* 8.5* 7.7* 7.9* 7.8*  HCT 27.0* 26.2* 23.7* 24.6* 24.0*  MCV  --  94.2 91.9 93.2 94.1  PLT  --  316 290 306 317   Cardiac Enzymes: No results for input(s): CKTOTAL, CKMB, CKMBINDEX, TROPONINI in the last 168 hours. BNP (last 3 results) No results for input(s): BNP in the last 8760 hours.  ProBNP (last 3 results) No results for input(s): PROBNP in the last 8760 hours.  CBG: Recent Labs  Lab 09/24/19 2010 09/24/19 2359 09/25/19 0409 09/25/19 0734 09/25/19 1154  GLUCAP 180* 91 77 109* 114*    No results found for this or any previous visit (from the past 240 hour(s)).   Studies: No results found.  Scheduled Meds: . amLODipine  5 mg Oral Daily  . bethanechol  5 mg Oral TID  . Chlorhexidine Gluconate Cloth  6 each Topical Daily  . cholestyramine  4 g Oral BID  . cyanocobalamin  1,000 mcg Intramuscular Once  . escitalopram  20 mg Oral Daily  . feeding supplement  1 Container Oral TID BM  . gabapentin  300 mg Oral BID  . insulin aspart  0-15 Units Subcutaneous Q4H  . metoprolol tartrate  50 mg Oral BID  . nystatin  5 mL Oral QID  . pantoprazole  40 mg Intravenous Q12H  . sodium chloride flush  10-40 mL Intracatheter Q12H  . tamsulosin  0.4 mg Oral QPC supper   Continuous Infusions: . TPN CYCLIC-ADULT (ION)      Principal Problem:   Acute GI bleeding Active Problems:   Hyperglycemia (glucose 203 on admission)   HTN (hypertension)   Type 2 diabetes mellitus with other circulatory complications (HCC)   Severe protein-calorie malnutrition (HCC)   Anemia of chronic disease   Schizophrenia (HCC)   Sacral decubitus ulcer, stage II   Below-knee amputee (HCC)   Intra-abdominal fluid collection, chronic   S/P BKA (below knee amputation) bilateral (HCC)   Enterocutaneous fistula   Fever    Iron deficiency anemia due to chronic blood loss   Hypoalbuminemia   On total parenteral nutrition (TPN)   PVD (peripheral vascular disease) (HCC) s/p bilateral BKAs   Hypotension   Hyponatremia (Sodium 131 on admission)   PUD (peptic ulcer disease) with history of perforated gastric ulcer s/p Gina [name] patch   Chronic pain syndrome   Proteinuria   Hypomagnesemia   Hypophosphatemia   Stage 4 pressure ulcer ([place]) of bilateral coccyx   Pressure ulcer, unstageable ([place]) to right distal knee, left distal knee   Pressure ulcer of left mid thigh, stage 2 (HCC)   Pressure injury,  stage 3 (HCC) to mid sacrum, left leg and right leg   Underweight   Encounter for blood transfusion on 09/13/19   Acute urinary retention   Hypoglycemia   Consultants:  Infectious disease, signed off  Gastroenterology, curbside  Procedures:  None  Antibiotics: Anti-infectives (From admission, onward)   Start     Dose/Rate Route Frequency Ordered Stop   09/13/19 1030  anidulafungin (ERAXIS) 100 mg in sodium chloride 0.9 % 100 mL IVPB  Status:  Discontinued        100 mg 78 mL/hr over 100 Minutes Intravenous Every 24 hours 09/12/19 0952 09/14/19 1230   09/12/19 1030  anidulafungin (ERAXIS) 200 mg in sodium chloride 0.9 % 200 mL IVPB  Status:  Discontinued        200 mg 78 mL/hr over 200 Minutes Intravenous  Once 09/12/19 6734 09/15/19 1937       Time spent: [place] ANP  [facility] Pager [phone]. If 7PM-7AM, please contact night-coverage at [email or web address] 09/25/2019, 2:21 PM  LOS: 13 days

## 2019-09-25 NOTE — Progress Notes (Signed)
PHARMACY - TOTAL PARENTERAL NUTRITION CONSULT NOTE  Indication:  EC fistula  Patient Measurements: Height: 5\' 2"  (157.5 cm) Weight: 45.4 kg (100 lb) IBW/kg (Calculated) : 50.1 TPN AdjBW (KG): 45.4 Body mass index is 18.29 kg/m. Usual Weight: 53-60 kg  Assessment:  [name] presents on 09/24/2019 with anemia, likely concurrent GIB. Patient has a history of perforated prepyloric ulcer s/p [place].  She has an Chief Strategy Officer in place from January 2021 due to [facility] fistula and has been on TPN since 02/2019.  She was recently admitted with candidemia, resulting in PICC replacement.  Pharmacy consulted to continue TPN.  Patient reports not missing any TPN bags PTA. Per further discussion with MD on 7/2, held off on starting TPN initially due to fever and recent fungemia. TPN resumed 7/5. ID monitoring off antibiotics for now.  Glucose / Insulin: Hx DM controlled by diet outpatient. Required insulin gtt last admit. CBGs 77-180. Utilized 6 units / 24 hours. Electrolytes: Electrolytes WNL Renal: AKI resolved - SCr 0.79 (BL 0.6-0.8), BUN 51. UOP 0.8 ml/kg/hr LFTs / TGs: LFTs WNL, Tbili low, TG WNL Prealbumin / albumin: Prealbumin increased to 8.5. Albumin 1.5 Intake / Output; MIVF: Drain output 500 mL/24hrs; Positive 1.3L this admit; MIVF d/c'd 7/9 GI Imaging: 7/2 CT abd/pelvis - similar extent, increased fullness of chronic intra-abdominal collection associated w/ known ECF, cholelithiasis Surgeries / Procedures: none since admit  Central access: PICC removed 6/12; replaced 6/16 TPN start date: chronic TPN  Nutritional Goals (Chronic TPN formula, per [facility] staff on 7/2): kCal: 1472 kCal, Protein (concentrated AA): 90g, CHO 180g, ILE 50g, no insulin Infuse 1800 mL over 14 hrs  RD goals as of 7/13: 1600-1800 kCal, 75-90g protein, > 1.6L fluid per day  Current Nutrition:  HH/CMD - poor appetite Boost TID between meals - Patient had 2/3 boost supplements yesterday   Plan:  Continue  cyclic TPN 1194 mL over 14 hours - 69 mL/hr x 1 hr; then 138 mL/hr x 12 hrs; then 69 mL/hr x 1 hr  TPN provides 90 g AA, 216 g CHO, and 49 g ILE, for total 1580 kCal; meeting 100% of protein and 99% of kCal goals. Electrolytes in TPN: no change today, Cl:Ac 1:1 Add standard MVI and trace elements to TPN Reduce insulin to 5 units insulin in TPN and continue moderate SSI q4h and follow up CBGs closely and oral consumption Monitor TPN labs Monday/Thursday, repeat BMET in AM Monitor discharge plan - planning SNF or LTACH  Salome Arnt, PharmD, BCPS Clinical Pharmacist Please see AMION for all pharmacy numbers 09/25/2019 7:37 AM

## 2019-09-25 NOTE — TOC Progression Note (Signed)
Transition of Care [facility]) - Progression Note    Patient Details  Name: Gina [name] MRN: [id number] Date of Birth: 07-15-1960  Transition of Care [facility] CM/SW [place], [place] Phone Number: 09/25/2019, 10:30 AM  Clinical Narrative:    Continued difficulty with locating SNF that accepts pt with TPN/Eakins pouch. Further referrals made, [facility] leadership aware. Pt has been declined by both LTACs as does not meet admissions criteria.     Expected Discharge Plan: [place] Barriers to Discharge: Continued Medical Work up, Ship broker, Other (comment), Awaiting State Approval (PASRR) (TPN needed)  Expected Discharge Plan and Services Expected Discharge Plan: [place] Living arrangements for the past 2 months: Apartment   Readmission Risk Interventions Readmission Risk Prevention Plan 09/16/2019 08/29/2019 08/19/2019  Post Dischage Appt - - -  Medication Screening - - -  Transportation Screening Complete Complete Complete  PCP follow-up - - -  PCP or Specialist Appt within 3-5 Days - - -  Not Complete comments - - -  HRI or [place] Work Consult for [place] Planning/Counseling - - -  SW consult not completed comments - - -  Palliative Care Screening - - -  Palliative Care Screening Not Complete Comments - - -  Medication Review ([place]) Referral to Pharmacy Complete Complete  PCP or Specialist appointment within 3-5 days of discharge Not Complete Complete -  PCP/Specialist Appt Not Complete comments disposition pending - -  HRI or Home Care Consult Complete Complete Complete  SW Recovery Care/Counseling Consult Complete Complete Complete  Palliative Care Screening Complete Complete Not Applicable  Skilled Nursing Facility Complete Complete Not Applicable  Some recent data might be hidden

## 2019-09-26 LAB — CBC WITH DIFFERENTIAL/PLATELET
Abs Immature Granulocytes: 0.05 10*3/uL (ref 0.00–0.07)
Basophils Absolute: 0 10*3/uL (ref 0.0–0.1)
Basophils Relative: 0 %
Eosinophils Absolute: 0.1 10*3/uL (ref 0.0–0.5)
Eosinophils Relative: 1 %
HCT: 23.6 % — ABNORMAL LOW (ref 36.0–46.0)
Hemoglobin: 7.3 g/dL — ABNORMAL LOW (ref 12.0–15.0)
Immature Granulocytes: 1 %
Lymphocytes Relative: 26 %
Lymphs Abs: 1.8 10*3/uL (ref 0.7–4.0)
MCH: 29.4 pg (ref 26.0–34.0)
MCHC: 30.9 g/dL (ref 30.0–36.0)
MCV: 95.2 fL (ref 80.0–100.0)
Monocytes Absolute: 0.7 10*3/uL (ref 0.1–1.0)
Monocytes Relative: 10 %
Neutro Abs: 4.4 10*3/uL (ref 1.7–7.7)
Neutrophils Relative %: 62 %
Platelets: 254 10*3/uL (ref 150–400)
RBC: 2.48 MIL/uL — ABNORMAL LOW (ref 3.87–5.11)
RDW: 16.2 % — ABNORMAL HIGH (ref 11.5–15.5)
WBC: 7 10*3/uL (ref 4.0–10.5)
nRBC: 0 % (ref 0.0–0.2)

## 2019-09-26 LAB — GLUCOSE, CAPILLARY
Glucose-Capillary: 125 mg/dL — ABNORMAL HIGH (ref 70–99)
Glucose-Capillary: 130 mg/dL — ABNORMAL HIGH (ref 70–99)
Glucose-Capillary: 68 mg/dL — ABNORMAL LOW (ref 70–99)
Glucose-Capillary: 74 mg/dL (ref 70–99)
Glucose-Capillary: 108 mg/dL — ABNORMAL HIGH (ref 70–99)
Glucose-Capillary: 284 mg/dL — ABNORMAL HIGH (ref 70–99)

## 2019-09-26 LAB — BASIC METABOLIC PANEL
Anion gap: 5 (ref 5–15)
BUN: 53 mg/dL — ABNORMAL HIGH (ref 6–20)
CO2: 29 mmol/L (ref 22–32)
Calcium: 8 mg/dL — ABNORMAL LOW (ref 8.9–10.3)
Chloride: 103 mmol/L (ref 98–111)
Creatinine, Ser: 0.85 mg/dL (ref 0.44–1.00)
GFR calc Af Amer: >60 mL/min (ref >60)
GFR calc non Af Amer: >60 mL/min (ref >60)
Glucose, Bld: 136 mg/dL — ABNORMAL HIGH (ref 70–99)
Potassium: 4.8 mmol/L (ref 3.5–5.1)
Sodium: 137 mmol/L (ref 135–145)

## 2019-09-26 LAB — URINALYSIS, ROUTINE W REFLEX MICROSCOPIC
Bilirubin Urine: NEGATIVE
Glucose, UA: NEGATIVE mg/dL
Ketones, ur: NEGATIVE mg/dL
Leukocytes,Ua: NEGATIVE
Nitrite: NEGATIVE
Protein, ur: 100 mg/dL — AB
Specific Gravity, Urine: 1.013 (ref 1.005–1.030)
pH: 7 (ref 5.0–8.0)

## 2019-09-26 MED ORDER — HYDRALAZINE HCL 25 MG PO TABS
25.0000 mg | ORAL_TABLET | Freq: Three times a day (TID) | ORAL | Status: DC
  Administered 2019-09-26 – 2019-10-02 (×18): 25 mg via ORAL
  Filled 2019-09-26 (×19): qty 1

## 2019-09-26 MED ORDER — TRACE MINERALS CU-MN-SE-ZN 300-55-60-3000 MCG/ML IV SOLN
INTRAVENOUS | Status: AC
  Filled 2019-09-26: qty 600

## 2019-09-26 MED ORDER — AMLODIPINE BESYLATE 10 MG PO TABS
10.0000 mg | ORAL_TABLET | Freq: Every day | ORAL | Status: DC
  Administered 2019-09-27 – 2019-10-02 (×6): 10 mg via ORAL
  Filled 2019-09-26 (×7): qty 1

## 2019-09-26 NOTE — Progress Notes (Addendum)
[facility] PROGRESS NOTE  Gina [name] YYT:[id number] DOB: [birth year] DOA: 10/07/2019 PCP: [name], MD  Status: Inpatient---Remains inpatient appropriate because:Ongoing diagnostic testing needed not appropriate for outpatient work up, Unsafe d/c plan and IV treatments appropriate due to intensity of illness or inability to take PO   Dispo: The patient is from: Home              Anticipated d/c is to: SNF              Anticipated d/c date is: > 3 days              Patient currently is medically stable to d/c.  On [name] barrier to discharge is SNF that is capable of managing chronic TPN as well as Eakin's pouch over EC fistula. Previously patient's daughter quit her job to care for her mother 5 days a week. Care was provided both by daughter and son. Daughter also has children she must take care of. Unfortunately it was not financially feasible for the daughter to remain out of work so she has had to return to work and therefore patient did not have appropriate level of 24/7 care available at discharge.  Code Status: Partial: DNR except okay to use NIMV/BiPAP Family Communication: Patient only DVT prophylaxis: Low risk secondary to chronicity of current issues and bilateral lower extremity amputations therefore no indication for pharmacological DVT prophylaxis or SCDs  HPI: 59 year old with past medical history significant for perforated prepyloric ulcer in 2020 status post exploratory laparotomy with [name] patch, chronic TPN via right arm PICC line, insulin-dependent diabetes, history of bilateral lower quadrant drains along with enterocutaneous fistula with colostomy bag, chronic sacral pressure ulcer stage II, schizophrenia, bilateral BKA who presents for evaluation of fever. She has been admitted a few times this year for sepsis including MRSA bacteremia, fungemia, Candida peritonitis in April, Proteus UTI, Candida glabrata who completed Eraxis 6/26. Presents with persistent  fevers and low hemoglobin. Patient reported that she was sent to the ED due to low hemoglobin. Patient was sent from PCP office due to hypotension, systolic blood pressure in the 60s, patient had an episode of nausea and vomiting. In the ED her hemoglobin was stable around 7.7, hyponatremia, hyperglycemia. Fecal occult was noted to be positive.  7/13, she lives at home and her children were taking turns to take care of her.  Now they cannot provide any care at home.  Unable to be accepted by multiple LTAC or skilled nursing facilities.  She does want to go home, however does not have any support.  Subjective:  Complaining of nausea. On physical exam was also reporting suprapubic discomfort  Objective: Vitals:   09/25/19 2018 09/26/19 0434  BP: (!) 174/81 (!) 183/85  Pulse: 78 77  Resp: 16 16  Temp: 99 F (37.2 C) 99 F (37.2 C)  SpO2: 97% 95%    Intake/Output Summary (Last 24 hours) at 09/26/2019 0857 Last data filed at 09/26/2019 0433 Gross per 24 hour  Intake 7.26 ml  Output 1800 ml  Net -1792.74 ml   Filed Weights   09/12/2019 1521  Weight: 45.4 kg    Exam: Constitutional: NAD, calm, uncomfortable 2/2 buttock discomfort ENMT: Mucous membranes are moist.  Respiratory: clear to auscultation bilaterally. Normal respiratory effort.  Air 96% sat Cardiovascular: Regular rate and rhythm, no murmurs / rubs / gallops. No extremity edema. 2+ pedal pulses.  Abdomen: no tenderness, no masses palpated.  Sounds present.  TPN infusing for nutrition.  Eakin's pouch over EC fistula with feculent drainage to collection bag Genitourinary: Foley cath removed on 7/14 and patient voiding Musculoskeletal: no clubbing / cyanosis. No joint deformity upper and lower extremities.  bilateral lower extremity amputations.  Good ROM, no contractures. Normal muscle tone.  Skin: no rashes, lesions, ulcers. No induration Neurologic: CN 2-12 grossly intact. Sensation intact, DTR normal. Strength 3-4/5 x all  4 extremities.  Psychiatric:  Alert and oriented x 3. Normal mood.    Assessment/Plan: Fever of unknown origin, present on admission:  History of Candida glabrata fungemia Currently no evidence of infection. Repeat blood cultures, urinalysis and urine cultures negative. Infectious disease consulted, patient was restarted on Eraxis that she received for 2 days and that was discontinued as of\26 No evidence of recurrent fever or localization of infection.  Monitoring off antibiotics.  Afebrile now. CT scan abdomen pelvis chronic intra-abdominal collection, enterocutaneous fistula. Symptomatic treatment.  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 Surgery okay with comfort feeds chronic for this patient Continue parenteral nutrition Due to level of patient's care which requires 24/7 attention family unable to manage patient at home family unable to manage patient at home therefore plan is to discharge to skilled nursing facility-has Medicaid  I will also reach out to patient's family again today to clarify barriers to continuing care at home in the event there are things we can assist with therefore allowing patient to discharge to home which is her preferred disposition  Nausea Also associated with suprapubic discomfort Recently had Foley catheter removed so we will check urinalysis and culture to rule out UTI Low-grade fevers in the 99 F range-check CBC  Anemia:  Iron deficiency with likely acute on chronic anemia as well as malnutrition. Hemoccult positive stool present on admission.  Suspect chronic GI bleeding. Hemoglobin 6.6 - 1 unit of PRBC-  8.5-7.4-7-6.6- 1 unit PRBC -8.5-7.5.  We will continue to closely monitor. Continue IV Protonix, will change to p.o. Protonix on discharge. Previous provider discussed with Dr. Paulita Fujita with [facility] and they recommended symptomatic treatment. Received IV iron on 7/5.   Was given a repeat dose on 7/13  Hypotension  (resolved) Hypertension:  Etiology to hypotension 2/2 hypovolemia.     Continue metoprolol  Started patient on amlodipine and hydralazine.    Type 2 diabetes, diet controlled:  Currently on TPN and remains on sliding scale insulin with readings between 101 and 194 Insulin adjusted in TPN per pharmacy.  Sacral decubitus ulcer stage IV/left lower extremity stage II decubitus ulcer:  Present on admission.   Sacral coccyx ulcer Reevaluated by [place] RN on 7/14.  Wound now measures 3.1 x 1 x 0.2 cm with 161% slick dark pink bed.  No longer meets requirement of stage IV decubitus.  Santyl has been discontinued in small piece of Xeroform dressing placed into the gluteal fold wound with recommendations to cover with foam dressing and change daily. Continue air mattress.  Order placed for chair pad. Foley catheter discontinued Continue to follow left lower extremity decubitus  Chronic pain due to bilateral below knee amputation:  On gabapentin and oxycodone.  Acute kidney injury/history of urinary retention:  AKI resolved.   She had retained urine multiple times - Foley in but will try to discontinue on 7/14 and follow bladder scans for recurrent urinary retention We will also start Flomax and bethanechol   Nutrition Status: Nutrition Problem: Moderate Malnutrition Etiology: chronic illness (EC fistula, perforated prepyloric ulcer on TPN) Signs/Symptoms: percent weight loss, moderate fat depletion, moderate  muscle depletion Percent weight loss: 9 % (in 1 mo) Interventions: Refer to RD note for recommendations **On TPN, managed by pharmacy.  IV fluids, electrolyte replacement along with TPN protocol. Needing high complexity of care , will benefit with placement at nursing home. Estimated body mass index is 18.29 kg/m as calculated from the following:   Height as of this encounter: 5\' 2"  (1.575 m).   Weight as of this encounter: 45.4 kg.    Data Reviewed: Basic Metabolic  Panel: Recent Labs  Lab 09/21/19 0359 09/21/19 0359 09/22/19 0333 09/23/19 0357 09/24/19 0313 09/25/19 0440 09/26/19 0331  NA 137   < > 139 136 136 135 137  K 3.7   < > 3.7 3.2* 4.8 4.7 4.8  CL 104   < > 103 99 101 100 103  CO2 27   < > 30 29 29 29 29   GLUCOSE 116*   < > 196* 155* 145* 83 136*  BUN 48*   < > 45* 40* 44* 51* 53*  CREATININE 0.77   < > 0.74 0.71 0.76 0.79 0.85  CALCIUM 8.0*   < > 8.0* 7.8* 7.9* 7.9* 8.0*  MG 2.1  --  2.0 2.1 2.3 2.3  --   PHOS 2.8  --  3.0 3.5 4.0 3.9  --    < > = values in this interval not displayed.   Liver Function Tests: Recent Labs  Lab 09/22/19 0333 09/25/19 0440  AST 15 20  ALT 13 15  ALKPHOS 97 96  BILITOT 0.5 0.2*  PROT 6.0* 6.3*  ALBUMIN 1.3* 1.5*   No results for input(s): LIPASE, AMYLASE in the last 168 hours. No results for input(s): AMMONIA in the last 168 hours. CBC: Recent Labs  Lab 09/20/19 0430 09/21/19 0359 09/22/19 0333  WBC 8.9 7.7 6.2  NEUTROABS  --   --  3.9  HGB 7.7* 7.9* 7.8*  HCT 23.7* 24.6* 24.0*  MCV 91.9 93.2 94.1  PLT 290 306 317   Cardiac Enzymes: No results for input(s): CKTOTAL, CKMB, CKMBINDEX, TROPONINI in the last 168 hours. BNP (last 3 results) No results for input(s): BNP in the last 8760 hours.  ProBNP (last 3 results) No results for input(s): PROBNP in the last 8760 hours.  CBG: Recent Labs  Lab 09/25/19 1520 09/25/19 2015 09/25/19 2346 09/26/19 0429 09/26/19 0743  GLUCAP 120* 143* 138* 125* 130*    No results found for this or any previous visit (from the past 240 hour(s)).   Studies: No results found.  Scheduled Meds:  amLODipine  10 mg Oral Daily   bethanechol  5 mg Oral TID   Chlorhexidine Gluconate Cloth  6 each Topical Daily   cholestyramine  4 g Oral BID   cyanocobalamin  1,000 mcg Intramuscular Once   escitalopram  20 mg Oral Daily   feeding supplement  1 Container Oral TID BM   gabapentin  300 mg Oral BID   hydrALAZINE  25 mg Oral Q8H   insulin  aspart  0-15 Units Subcutaneous Q4H   metoprolol tartrate  50 mg Oral BID   nystatin  5 mL Oral QID   pantoprazole  40 mg Intravenous Q12H   sodium chloride flush  10-40 mL Intracatheter Q12H   tamsulosin  0.4 mg Oral QPC supper   Continuous Infusions:  TPN CYCLIC-ADULT (ION)      Principal Problem:   Acute GI bleeding Active Problems:   Hyperglycemia (glucose 203 on admission)   HTN (hypertension)  Type 2 diabetes mellitus with other circulatory complications (HCC)   Severe protein-calorie malnutrition (HCC)   Anemia of chronic disease   Schizophrenia ([place])   Sacral decubitus ulcer, stage II   Below-knee amputee (HCC)   Intra-abdominal fluid collection, chronic   S/P BKA (below knee amputation) bilateral (HCC)   Enterocutaneous fistula   Fever   Iron deficiency anemia due to chronic blood loss   Hypoalbuminemia   On total parenteral nutrition (TPN)   PVD (peripheral vascular disease) (HCC) s/p bilateral BKAs   Hypotension   Hyponatremia (Sodium 131 on admission)   PUD (peptic ulcer disease) with history of perforated gastric ulcer s/p [name] patch   Chronic pain syndrome   Proteinuria   Hypomagnesemia   Hypophosphatemia   Stage 4 pressure ulcer ([place]) of bilateral coccyx   Pressure ulcer, unstageable ([place]) to right distal knee, left distal knee   Pressure ulcer of left mid thigh, stage 2 (HCC)   Pressure injury, stage 3 ([place]) to mid sacrum, left leg and right leg   Underweight   Encounter for blood transfusion on 09/13/19   Acute urinary retention   Hypoglycemia   Consultants:  Infectious disease, signed off  Gastroenterology, curbside  Procedures:  None  Antibiotics: Anti-infectives (From admission, onward)   Start     Dose/Rate Route Frequency Ordered Stop   09/13/19 1030  anidulafungin (ERAXIS) 100 mg in sodium chloride 0.9 % 100 mL IVPB  Status:  Discontinued        100 mg 78 mL/hr over 100 Minutes Intravenous Every 24 hours 09/12/19 0952  09/14/19 1230   09/12/19 1030  anidulafungin (ERAXIS) 200 mg in sodium chloride 0.9 % 200 mL IVPB  Status:  Discontinued        200 mg 78 mL/hr over 200 Minutes Intravenous  Once 09/12/19 0952 09/15/19 1975       Time spent: Fletcher ANP  [facility] Pager [phone]. If 7PM-7AM, please contact night-coverage at [email or web address] 09/26/2019, 8:57 AM  LOS: 14 days

## 2019-09-26 NOTE — Progress Notes (Signed)
PHARMACY - TOTAL PARENTERAL NUTRITION CONSULT NOTE  Indication:  EC fistula  Patient Measurements: Height: 5\' 2"  (157.5 cm) Weight: 45.4 kg (100 lb) IBW/kg (Calculated) : 50.1 TPN AdjBW (KG): 45.4 Body mass index is 18.29 kg/m. Usual Weight: 53-60 kg  Assessment:  20 YOF presents on 09/19/2019 with anemia, likely concurrent GIB. Patient has a history of perforated prepyloric ulcer s/p [place].  She has an Chief Strategy Officer in place from January 2021 due to [facility] fistula and has been on TPN since 02/2019.  She was recently admitted with candidemia, resulting in PICC replacement.  Pharmacy consulted to continue TPN.  Patient reports not missing any TPN bags PTA. Per further discussion with MD on 7/2, held off on starting TPN initially due to fever and recent fungemia. TPN resumed 7/5. ID monitoring off antibiotics for now.  Glucose / Insulin: Hx DM controlled by diet outpatient. Required insulin gtt last admit. CBGs 114-143. Utilized 6 units / 24 hours + 5 in the TPN Electrolytes: Electrolytes WNL, potassium has been at the upper end of the goal range Renal: AKI resolved - SCr 0.85 (BL 0.6-0.8), BUN 53. UOP 1.7 ml/kg/hr LFTs / TGs: LFTs WNL, Tbili low, TG WNL Prealbumin / albumin: Prealbumin increased to 8.5. Albumin 1.5 Intake / Output; MIVF: No drain output noted GI Imaging: 7/2 CT abd/pelvis - similar extent, increased fullness of chronic intra-abdominal collection associated w/ known ECF, cholelithiasis Surgeries / Procedures: none since admit  Central access: PICC removed 6/12; replaced 6/16 TPN start date: chronic TPN  Nutritional Goals (Chronic TPN formula, per [facility] staff on 7/2): kCal: 1472 kCal, Protein (concentrated AA): 90g, CHO 180g, ILE 50g, no insulin Infuse 1800 mL over 14 hrs  RD goals as of 7/13: 1600-1800 kCal, 75-90g protein, > 1.6L fluid per day  Current Nutrition:  HH/CMD - poor appetite Boost TID between meals - Patient had 3 boost supplements yesterday    Plan:  Continue cyclic TPN 1324 mL over 14 hours - 69 mL/hr x 1 hr; then 138 mL/hr x 12 hrs; then 69 mL/hr x 1 hr  TPN provides 90 g AA, 216 g CHO, and 49 g ILE, for total 1580 kCal; meeting 100% of protein and 99% of kCal goals. Electrolytes in TPN: reduce potassium 95meq/L>>20meq/L, Cl:Ac 1:1 Add standard MVI and trace elements to TPN Remove insulin from TPN as pt is requiring minimal but continue moderate SSI q4h and follow up CBGs closely and oral consumption Monitor TPN labs Monday/Thursday, repeat BMET in AM Monitor discharge plan - planning SNF or LTACH  Salome Arnt, PharmD, BCPS Clinical Pharmacist Please see AMION for all pharmacy numbers 09/26/2019 7:24 AM

## 2019-09-26 NOTE — Progress Notes (Signed)
Occupational Therapy Treatment Patient Details Name: Gina [name] MRN: [id number] DOB: 1961/02/26 Today's Date: 09/26/2019    History of present illness Pt is a 59 year old with past medical history significant for perforated prepyloric ulcer in 2020 status post exploratory laparotomy with [name] patch, chronic TPN via right arm PICC line, insulin-dependent diabetes, history off bilateral lower quadrant drains along with enterocutaneous fistula with colostomy bag, chronic sacral pressure ulcer stage II, schizophrenia, bilateral BKA who presents for evaluation of fever of unclear source. Also with anemia thought to be due to chronic GI bleed.    OT comments  Pt seen for follow up OT session with focus on OOB mobility in w/c. Pt completed AP transfer to w/c with total A (has slight ability to assist with RUE). Once in w/c, pt able to participate in propulsion with max A to maintain steering and safety. LUE appeared edematous this date in dorsum of hand and forearm. Educated pt on AROM exercises and elevation to reduce swelling. Continue to note cognitive deficits in regard to safety/awareness. D/c recs remain appropriate, will continue to follow. Goals updated to reflect current progress.   Follow Up Recommendations  SNF;Supervision/Assistance - 24 hour    Equipment Recommendations  None recommended by OT    Recommendations for Other Services      Precautions / Restrictions Precautions Precautions: Fall Precaution Comments: eakin pouch, bilateral BKA Restrictions Weight Bearing Restrictions: No Other Position/Activity Restrictions: bilateral BKA       Mobility Bed Mobility Overal bed mobility: Needs Assistance   Rolling: Mod assist;+2 for physical assistance     Sit to supine: Mod assist;+2 for physical assistance   General bed mobility comments: max A to pull into long sitting and scoot in preparation for AP Transfer  Transfers Overall transfer level: Needs  assistance Equipment used:  (bed pad.) Transfers: Lateral/Scoot Transfers       Anterior-Posterior transfers: Total assist   General transfer comment: total A to scoot from bed to [facility]- very slight assist from pt with RUE    Balance Overall balance assessment: Needs assistance   Sitting balance-Leahy Scale: Poor Sitting balance - Comments: reliant on BUE support and supervision Postural control: Posterior lean   Standing balance-Leahy Scale:  (unable BKA)                             ADL either performed or assessed with clinical judgement   ADL Overall ADL's : Needs assistance/impaired Eating/Feeding: Minimal assistance Eating/Feeding Details (indicate cue type and reason): poor [facility] with utensils                     Toilet Transfer: Total assistance Toilet Transfer Details (indicate cue type and reason): simulated with max A AP transfer; total A with pt and OT. Only able to minimally help push with UEs Toileting- Clothing Manipulation and Hygiene: Total assistance       Functional mobility during ADLs: Wheelchair;Maximal assistance (w/c propulsion in hallway) General ADL Comments: pt completed w/c transfer to practice propulsion in hallway     Vision Patient Visual Report: No change from baseline     Perception     Praxis      Cognition Arousal/Alertness: Awake/alert Behavior During Therapy: WFL for tasks assessed/performed Overall Cognitive Status: History of cognitive impairments - at baseline Area of Impairment: Safety/judgement;Awareness;Problem solving  Safety/Judgement: Decreased awareness of safety;Decreased awareness of deficits   Problem Solving: Slow processing;Requires verbal cues;Requires tactile cues General Comments: continues to state how SNF is an unreasonable option for her and how her children are trying to put her there        Exercises     Shoulder Instructions       General  Comments      Pertinent Vitals/ Pain       Pain Assessment: Faces Faces Pain Scale: Hurts little more Pain Location: residual limbs and back Pain Descriptors / Indicators: Aching;Grimacing Pain Intervention(s): Monitored during session;Repositioned  Home Living                                          Prior Functioning/Environment              Frequency  Min 2X/week        Progress Toward Goals  OT Goals(current goals can now be found in the care plan section)  Progress towards OT goals: Progressing toward goals  Acute Rehab OT Goals Patient Stated Goal: get out of room OT Goal Formulation: With patient Time For Goal Achievement: 10/10/19 Potential to Achieve Goals: [place] Discharge plan remains appropriate    Co-evaluation                 AM-PAC OT "6 Clicks" Daily Activity     Outcome Measure   Help from another person eating meals?: A Little Help from another person taking care of personal grooming?: A Lot Help from another person toileting, which includes using toliet, bedpan, or urinal?: Total Help from another person bathing (including washing, rinsing, drying)?: A Lot Help from another person to put on and taking off regular upper body clothing?: A Lot Help from another person to put on and taking off regular lower body clothing?: A Lot 6 Click Score: 12    End of Session Equipment Utilized During Treatment: Other (comment) (w/c)  OT Visit Diagnosis: Other abnormalities of gait and mobility (R26.89);Muscle weakness (generalized) (M62.81);Pain;Other symptoms and signs involving cognitive function   Activity Tolerance Patient tolerated treatment well   Patient Left Other (comment) (in w/c in prep for PT Session)   Nurse Communication Mobility status        Time: 5885-0277 OT Time Calculation (min): 44 min  Charges: OT General Charges $OT Visit: 1 Visit OT Treatments $Self Care/Home Management : 38-52  mins  Zenovia Jarred, MSOT, OTR/L [facility] Office Number: [phone] Pager: [phone]  Zenovia Jarred 09/26/2019, 5:33 PM

## 2019-09-26 NOTE — Progress Notes (Signed)
Physical Therapy Treatment Patient Details Name: Gina [name] MRN: [id number] DOB: 03-19-1960 Today's Date: 09/26/2019    History of Present Illness Pt is a 59 year old with past medical history significant for perforated prepyloric ulcer in 2020 status post exploratory laparotomy with [name] patch, chronic TPN via right arm PICC line, insulin-dependent diabetes, history off bilateral lower quadrant drains along with enterocutaneous fistula with colostomy bag, chronic sacral pressure ulcer stage II, schizophrenia, bilateral BKA who presents for evaluation of fever of unclear source. Also with anemia thought to be due to chronic GI bleed.     PT Comments    Pt performed back to bed with +2 external assistance.  She lacks strength in her arms to move back to bed.  Pt able to maintain core while PTA and rehan tech used bed pad for lateral scoot uphill.  Continue to recommend SNF placement at this time.     Follow Up Recommendations  SNF     Equipment Recommendations  None recommended by PT    Recommendations for Other Services       Precautions / Restrictions Precautions Precautions: Fall Precaution Comments: eakin pouch, bilateral BKA Restrictions Weight Bearing Restrictions: No Other Position/Activity Restrictions: bilateral BKA    Mobility  Bed Mobility Overal bed mobility: Needs Assistance   Rolling: Mod assist;+2 for physical assistance     Sit to supine: Mod assist;+2 for physical assistance   General bed mobility comments: Mod +2 overall, pt performed rolling to R and L to reposition and place bed pads under her bottom.  Transfers Overall transfer level: Needs assistance Equipment used:  (bed pad.) Transfers: Lateral/Scoot Transfers       Anterior-Posterior transfers: Total assist;+2 physical assistance   General transfer comment: Total assistance to move patient from [facility] ( lower surface) back to bed ( higher surface) with total +2 dependent position.  Pt  tolerated session well.  Ambulation/Gait Ambulation/Gait assistance:  (Unable)               Stairs             Wheelchair Mobility    Modified Rankin (Stroke Patients Only)       Balance Overall balance assessment: Needs assistance   Sitting balance-Leahy Scale: Poor Sitting balance - Comments: reliant on BUE support and supervision Postural control: Posterior lean   Standing balance-Leahy Scale:  (unable BKA)                              Cognition Arousal/Alertness: Awake/alert Behavior During Therapy: WFL for tasks assessed/performed Overall Cognitive Status: History of cognitive impairments - at baseline                                        Exercises      General Comments        Pertinent Vitals/Pain Pain Assessment: Faces Faces Pain Scale: Hurts little more Pain Location: residual limbs and back Pain Descriptors / Indicators: Aching;Grimacing Pain Intervention(s): Monitored during session;Repositioned    Home Living                      Prior Function            PT Goals (current goals can now be found in the care plan section) Acute Rehab PT Goals Potential to Achieve Goals: Good Progress towards PT  goals: Progressing toward goals    Frequency    Min 2X/week      PT Plan Current plan remains appropriate    Co-evaluation              AM-PAC PT "6 Clicks" Mobility   Outcome Measure  Help needed turning from your back to your side while in a flat bed without using bedrails?: A Lot Help needed moving from lying on your back to sitting on the side of a flat bed without using bedrails?: Total Help needed moving to and from a bed to a chair (including a wheelchair)?: Total Help needed standing up from a chair using your arms (e.g., wheelchair or bedside chair)?: Total Help needed to walk in hospital room?: Total Help needed climbing 3-5 steps with a railing? : Total 6 Click Score:  7    End of Session Equipment Utilized During Treatment: Gait belt Activity Tolerance: Patient limited by lethargy;Patient limited by pain Patient left: in bed;with bed alarm set Nurse Communication: Mobility status PT Visit Diagnosis: Other abnormalities of gait and mobility (R26.89);Muscle weakness (generalized) (M62.81)     Time: 7092-9574 PT Time Calculation (min) (ACUTE ONLY): 16 min  Charges:  $Therapeutic Activity: 8-22 mins                     [name] , PTA Acute Rehabilitation Services Pager [phone] Office [phone]     [name] [name] 09/26/2019, 4:01 PM

## 2019-09-27 LAB — GLUCOSE, CAPILLARY
Glucose-Capillary: 124 mg/dL — ABNORMAL HIGH (ref 70–99)
Glucose-Capillary: 216 mg/dL — ABNORMAL HIGH (ref 70–99)
Glucose-Capillary: 228 mg/dL — ABNORMAL HIGH (ref 70–99)
Glucose-Capillary: 261 mg/dL — ABNORMAL HIGH (ref 70–99)
Glucose-Capillary: 54 mg/dL — ABNORMAL LOW (ref 70–99)
Glucose-Capillary: 55 mg/dL — ABNORMAL LOW (ref 70–99)
Glucose-Capillary: 59 mg/dL — ABNORMAL LOW (ref 70–99)
Glucose-Capillary: 62 mg/dL — ABNORMAL LOW (ref 70–99)
Glucose-Capillary: 63 mg/dL — ABNORMAL LOW (ref 70–99)
Glucose-Capillary: 64 mg/dL — ABNORMAL LOW (ref 70–99)
Glucose-Capillary: 87 mg/dL (ref 70–99)
Glucose-Capillary: 96 mg/dL (ref 70–99)

## 2019-09-27 LAB — BASIC METABOLIC PANEL
Anion gap: 6 (ref 5–15)
BUN: 51 mg/dL — ABNORMAL HIGH (ref 6–20)
CO2: 25 mmol/L (ref 22–32)
Calcium: 7.8 mg/dL — ABNORMAL LOW (ref 8.9–10.3)
Chloride: 104 mmol/L (ref 98–111)
Creatinine, Ser: 0.86 mg/dL (ref 0.44–1.00)
GFR calc Af Amer: >60 mL/min (ref >60)
GFR calc non Af Amer: >60 mL/min (ref >60)
Glucose, Bld: 249 mg/dL — ABNORMAL HIGH (ref 70–99)
Potassium: 4.6 mmol/L (ref 3.5–5.1)
Sodium: 135 mmol/L (ref 135–145)

## 2019-09-27 MED ORDER — PANTOPRAZOLE SODIUM 40 MG PO TBEC
40.0000 mg | DELAYED_RELEASE_TABLET | Freq: Two times a day (BID) | ORAL | Status: DC
  Administered 2019-09-27 – 2019-10-02 (×10): 40 mg via ORAL
  Filled 2019-09-27 (×11): qty 1

## 2019-09-27 MED ORDER — SODIUM CHLORIDE 0.9 % IV SOLN
510.0000 mg | Freq: Once | INTRAVENOUS | Status: AC
  Administered 2019-09-27: 510 mg via INTRAVENOUS
  Filled 2019-09-27: qty 17

## 2019-09-27 MED ORDER — TRACE MINERALS CU-MN-SE-ZN 300-55-60-3000 MCG/ML IV SOLN
INTRAVENOUS | Status: AC
  Filled 2019-09-27: qty 600

## 2019-09-27 MED ORDER — DEXTROSE 50 % IV SOLN
INTRAVENOUS | Status: AC
  Administered 2019-09-27: 25 mL
  Filled 2019-09-27: qty 50

## 2019-09-27 MED ORDER — INSULIN ASPART 100 UNIT/ML ~~LOC~~ SOLN
0.0000 [IU] | SUBCUTANEOUS | Status: DC
  Administered 2019-09-27: 3 [IU] via SUBCUTANEOUS
  Administered 2019-09-28: 4 [IU] via SUBCUTANEOUS
  Administered 2019-09-28: 3 [IU] via SUBCUTANEOUS

## 2019-09-27 NOTE — Progress Notes (Signed)
PROGRESS NOTE    Gina [name]  TZG:[id number] DOB: [birth year] DOA: 10/02/2019 PCP: [name], MD    Brief Narrative:  59 year old with past medical history significant for perforated prepyloric ulcer in 2020 status post exploratory laparotomy with [name] patch, chronic TPN via right arm PICC line, insulin-dependent diabetes, history of bilateral lower quadrant drains along with enterocutaneous fistula with colostomy bag, chronic sacral pressure ulcer stage II, schizophrenia, bilateral BKA who presents for evaluation of fever.  She has been admitted a few times this year for sepsis including MRSA bacteremia, fungemia, Candida peritonitis in April, Proteus UTI, Candida glabrata who completed Eraxis 6/26.  Presents with persistent fevers and low hemoglobin. Patient reported that she was sent to the ED due to low hemoglobin.  Patient was sent from PCP office due to hypotension, systolic blood pressure in the 60s, patient had an episode of nausea and vomiting. In the ED her hemoglobin was stable around 7.7, hyponatremia, hyperglycemia.  Fecal occult was noted to be positive.  7/13, she lives at home and her children were taking turns to take care of her.  Now they cannot provide any care at home.  Unable to be accepted by multiple LTAC or skilled nursing facilities.  She does want to go home, however does not have any support. 7/17, patient seen and examined.  Appetite is poor.  No active nausea today.   Assessment & Plan:   Principal Problem:   Acute GI bleeding Active Problems:   Hyperglycemia (glucose 203 on admission)   HTN (hypertension)   Type 2 diabetes mellitus with other circulatory complications (HCC)   Severe protein-calorie malnutrition (HCC)   Anemia of chronic disease   Schizophrenia (HCC)   Sacral decubitus ulcer, stage II   Below-knee amputee (HCC)   Intra-abdominal fluid collection, chronic   S/P BKA (below knee amputation) bilateral (HCC)   Enterocutaneous fistula    Fever   Iron deficiency anemia due to chronic blood loss   Hypoalbuminemia   On total parenteral nutrition (TPN)   PVD (peripheral vascular disease) (HCC) s/p bilateral BKAs   Hypotension   Hyponatremia (Sodium 131 on admission)   PUD (peptic ulcer disease) with history of perforated gastric ulcer s/p [name] patch   Chronic pain syndrome   Proteinuria   Hypomagnesemia   Hypophosphatemia   Stage 4 pressure ulcer ([place]) of bilateral coccyx   Pressure ulcer, unstageable ([place]) to right distal knee, left distal knee   Pressure ulcer of left mid thigh, stage 2 (HCC)   Pressure injury, stage 3 ([place]) to mid sacrum, left leg and right leg   Underweight   Encounter for blood transfusion on 09/13/19   Acute urinary retention   Hypoglycemia  Fever of unknown origin, present on admission: Currently no evidence of infection. Repeat blood cultures, urinalysis and urine cultures negative. Infectious disease consulted, patient was restarted on Eraxis that she received for 2 days and that was discontinued. No evidence of recurrent fever or localization of infection.  Monitoring off antibiotics.  Afebrile now. CT scan abdomen pelvis chronic intra-abdominal collection, enterocutaneous fistula.Symptomatic treatment.  Anemia: Iron deficiency with likely acute on chronic anemia.  Hemoccult positive stool present on admission.  Suspect chronic GI bleeding. Hemoglobin 6.6 - 1 unit of PRBC-  8.5-7.4-7-6.6- 1 unit PRBC -8.5-7.5-7.3  We will continue to closely monitor. Continue IV Protonix, will change to p.o. Protonix today. Previous provider discussed with Dr. Paulita Fujita with [facility] and they recommended symptomatic treatment. Received IV iron on 7/5.  We  will repeat 1 dose today.  History of Candida glabrata fungemia: Completed Eraxis on 6/26.  Hypotension: Due to hypovolemia.  Improved. Blood pressures improved and elevated now.  Started patient on amlodipine and hydralazine.  Is already on metoprolol.  Type  2 diabetes, diet controlled: Currently on TPN and remains on sliding scale insulin. Insulin adjusted with TPN.  Sacral decubitus ulcer stage IV/left lower extremity stage II decubitus ulcer: Present on admission.  Local wound care.    Chronic pain due to bilateral below knee amputation: On gabapentin and oxycodone.  Acute kidney injury: AKI resolved.  Foley removed.  Voiding well.  Nutrition Status: Nutrition Problem: Moderate Malnutrition Etiology: chronic illness (EC fistula, perforated prepyloric ulcer on TPN) Signs/Symptoms: percent weight loss, moderate fat depletion, moderate muscle depletion Percent weight loss: 9 % (in 1 mo) Interventions: Refer to RD note for recommendations  On TPN, managed by pharmacy.  IV fluids, electrolyte replacement along with TPN protocol. Needing high complexity of care , will benefit with placement at nursing home.   DVT prophylaxis: SCD   Code Status: Limited code Family Communication: updated family intermittently.  Her son and daughter sometimes visit her in the hospital. Disposition Plan: Status is: Inpatient  Remains inpatient appropriate because:Unsafe d/c plan, needs higher level of care. LTAC or SNF level of care    Dispo: The patient is from: Home              Anticipated d/c is to: LTAC/ [facility]               Anticipated d/c date is: Whenever bed ready.              Patient currently is not medically stable to d/c. only can transfer to [facility] or skilled nursing facility.  Patient cannot go home.  Does not have any support.  Waiting to go to some facilities that can manage the Eakin's patch and TPN.      Consultants:   Infectious disease, signed off  Gastroenterology, curbside  Procedures:   None  Antimicrobials:  Anti-infectives (From admission, onward)   Start     Dose/Rate Route Frequency Ordered Stop   09/13/19 1030  anidulafungin (ERAXIS) 100 mg in sodium chloride 0.9 % 100 mL IVPB  Status:  Discontinued        100  mg 78 mL/hr over 100 Minutes Intravenous Every 24 hours 09/12/19 0952 09/14/19 1230   09/12/19 1030  anidulafungin (ERAXIS) 200 mg in sodium chloride 0.9 % 200 mL IVPB  Status:  Discontinued        200 mg 78 mL/hr over 200 Minutes Intravenous  Once 09/12/19 9381 09/15/19 8299         Subjective: Patient seen and examined.  No change in his status.  Some nausea persist. Objective: Vitals:   09/25/19 2018 09/26/19 0434 09/27/19 0421 09/27/19 1029  BP: (!) 174/81 (!) 183/85 (!) 162/76 (!) 156/81  Pulse: 78 77 86 69  Resp: 16 16 18    Temp: 99 F (37.2 C) 99 F (37.2 C) 98.1 F (36.7 C)   TempSrc: Oral Oral Oral   SpO2: 97% 95% 97%   Weight:      Height:        Intake/Output Summary (Last 24 hours) at 09/27/2019 1325 Last data filed at 09/27/2019 1034 Gross per 24 hour  Intake 127.22 ml  Output 1900 ml  Net -1772.78 ml   Filed Weights   09/18/2019 1521  Weight: 45.4 kg  Examination:  General exam: Appears calm and comfortable, chronically sick looking, frail lady. Respiratory system: Bilateral clear Cardiovascular system: No added sounds Gastrointestinal system: Soft nontender.  Enterocutaneous fistula with ostomy bag in place with Eakins patch, dark substance present. Central nervous system: Alert and oriented. No focal neurological deficits. Bilateral BKA.   Data Reviewed: I have personally reviewed following labs and imaging studies  CBC: Recent Labs  Lab 09/21/19 0359 09/22/19 0333 09/26/19 1014  WBC 7.7 6.2 7.0  NEUTROABS  --  3.9 4.4  HGB 7.9* 7.8* 7.3*  HCT 24.6* 24.0* 23.6*  MCV 93.2 94.1 95.2  PLT 306 317 256   Basic Metabolic Panel: Recent Labs  Lab 09/21/19 0359 09/21/19 0359 09/22/19 0333 09/22/19 0333 09/23/19 0357 09/24/19 0313 09/25/19 0440 09/26/19 0331 09/27/19 0350  NA 137   < > 139   < > 136 136 135 137 135  K 3.7   < > 3.7   < > 3.2* 4.8 4.7 4.8 4.6  CL 104   < > 103   < > 99 101 100 103 104  CO2 27   < > 30   < > 29 29 29  29 25   GLUCOSE 116*   < > 196*   < > 155* 145* 83 136* 249*  BUN 48*   < > 45*   < > 40* 44* 51* 53* 51*  CREATININE 0.77   < > 0.74   < > 0.71 0.76 0.79 0.85 0.86  CALCIUM 8.0*   < > 8.0*   < > 7.8* 7.9* 7.9* 8.0* 7.8*  MG 2.1  --  2.0  --  2.1 2.3 2.3  --   --   PHOS 2.8  --  3.0  --  3.5 4.0 3.9  --   --    < > = values in this interval not displayed.   GFR: Estimated Creatinine Clearance: 51.1 mL/min (by C-G formula based on SCr of 0.86 mg/dL). Liver Function Tests: Recent Labs  Lab 09/22/19 0333 09/25/19 0440  AST 15 20  ALT 13 15  ALKPHOS 97 96  BILITOT 0.5 0.2*  PROT 6.0* 6.3*  ALBUMIN 1.3* 1.5*   No results for input(s): LIPASE, AMYLASE in the last 168 hours. No results for input(s): AMMONIA in the last 168 hours. Coagulation Profile: No results for input(s): INR, PROTIME in the last 168 hours. Cardiac Enzymes: No results for input(s): CKTOTAL, CKMB, CKMBINDEX, TROPONINI in the last 168 hours. BNP (last 3 results) No results for input(s): PROBNP in the last 8760 hours. HbA1C: No results for input(s): HGBA1C in the last 72 hours. CBG: Recent Labs  Lab 09/27/19 1148 09/27/19 1149 09/27/19 1223 09/27/19 1250 09/27/19 1310  GLUCAP 55* 54* 59* 62* 63*   Lipid Profile: No results for input(s): CHOL, HDL, LDLCALC, TRIG, CHOLHDL, LDLDIRECT in the last 72 hours. Thyroid Function Tests: No results for input(s): TSH, T4TOTAL, FREET4, T3FREE, THYROIDAB in the last 72 hours. Anemia Panel: No results for input(s): VITAMINB12, FOLATE, FERRITIN, TIBC, IRON, RETICCTPCT in the last 72 hours. Sepsis Labs: No results for input(s): PROCALCITON, LATICACIDVEN in the last 168 hours.  No results found for this or any previous visit (from the past 240 hour(s)).       Radiology Studies: No results found.      Scheduled Meds: . amLODipine  10 mg Oral Daily  . bethanechol  5 mg Oral TID  . Chlorhexidine Gluconate Cloth  6 each Topical Daily  . cholestyramine  4 g Oral  BID  . cyanocobalamin  1,000 mcg Intramuscular Once  . escitalopram  20 mg Oral Daily  . feeding supplement  1 Container Oral TID BM  . gabapentin  300 mg Oral BID  . hydrALAZINE  25 mg Oral Q8H  . insulin aspart  0-20 Units Subcutaneous Q4H  . metoprolol tartrate  50 mg Oral BID  . nystatin  5 mL Oral QID  . pantoprazole  40 mg Oral BID  . sodium chloride flush  10-40 mL Intracatheter Q12H  . tamsulosin  0.4 mg Oral QPC supper   Continuous Infusions: . ferumoxytol    . TPN CYCLIC-ADULT (ION)       LOS: 15 days    Time spent: 25 minutes     Barb Merino, MD [facility] Pager [phone]

## 2019-09-27 NOTE — Progress Notes (Signed)
PHARMACY - TOTAL PARENTERAL NUTRITION CONSULT NOTE  Indication:  EC fistula  Patient Measurements: Height: 5\' 2"  (157.5 cm) Weight: 45.4 kg (100 lb) IBW/kg (Calculated) : 50.1 TPN AdjBW (KG): 45.4 Body mass index is 18.29 kg/m. Usual Weight: 53-60 kg  Assessment:  32 YOF presents on 09/22/2019 with anemia, likely concurrent GIB. Patient has a history of perforated prepyloric ulcer s/p [place] Field.  She has an Chief Strategy Officer in place from January 2021 due to [facility] fistula and has been on TPN since 02/2019.  She was recently admitted with candidemia, resulting in PICC replacement.  Pharmacy consulted to continue TPN.  Patient reports not missing any TPN bags PTA. Per further discussion with MD on 7/2, held off on starting TPN initially due to fever and recent fungemia. TPN resumed 7/5. ID monitoring off antibiotics for now.  Glucose / Insulin: Hx DM controlled by diet outpatient. Required insulin gtt last admit. CBGs 976-734 on cyclic TPN and 19-.379 off TPN Utilized 20 units / 24 hours + 5 in the TPN Electrolytes: Electrolytes WNL. Na at lower end of therapeutic range but stable. Renal: AKI resolved - SCr 0.86 (BL 0.6-0.8), BUN 51. UOP 1.3 ml/kg/hr LFTs / TGs: LFTs WNL, Tbili low, TG WNL Prealbumin / albumin: Prealbumin increased to 8.5. Albumin 1.5 Intake / Output; MIVF: No drain output noted GI Imaging: 7/2 CT abd/pelvis - similar extent, increased fullness of chronic intra-abdominal collection associated w/ known ECF, cholelithiasis Surgeries / Procedures: none since admit  Central access: PICC removed 6/12; replaced 6/16 TPN start date: chronic TPN  Nutritional Goals (Chronic TPN formula, per [facility] staff on 7/2): kCal: 1472 kCal, Protein (concentrated AA): 90g, CHO 180g, ILE 50g, no insulin Infuse 1800 mL over 14 hrs  RD goals as of 7/13: 1600-1800 kCal, 75-90g protein, > 1.6L fluid per day  Current Nutrition:  HH/CMD - poor appetite, per RN pt has to be encouraged to eat and  has not wanted breakfast yet  Boost TID between meals - Patient had 3 boost supplements yesterday   Plan:  Continue cyclic TPN 0240 mL over 14 hours - 69 mL/hr x 1 hr; then 138 mL/hr x 12 hrs; then 69 mL/hr x 1 hr  TPN provides 90 g AA, 216 g CHO, and 49 g ILE, for total 1580 kCal; meeting 100% of protein and 99% of kCal goals. No adjustments to electrolytes in TPN Add standard MVI and trace elements to TPN Continue no insulin in TPN and increase to resistant SSI q4h and follow up CBGs closely and oral consumption - given patient continues to have poor appetite and concern of morning hypoglycemia with previous insulin in TPN will continue with no insulin in TPN but if CBGs continue to be elevated during cyclic TPN tonight will likely need to add insulin back to TPN tomorrow  Monitor TPN labs Monday/Thursday, repeat BMET in AM Monitor discharge plan - planning SNF or [place], PharmD Clinical Pharmacist  09/27/2019 8:44 AM

## 2019-09-27 NOTE — Plan of Care (Signed)
  Problem: Education: Goal: Knowledge of General Education information will improve Description: Including pain rating scale, medication(s)/side effects and non-pharmacologic comfort measures 09/27/2019 1107 by Shanon Ace, RN Outcome: Progressing 09/27/2019 1107 by Shanon Ace, RN Outcome: Progressing   Problem: Health Behavior/Discharge Planning: Goal: Ability to manage health-related needs will improve 09/27/2019 1107 by Shanon Ace, RN Outcome: Progressing 09/27/2019 1107 by Shanon Ace, RN Outcome: Progressing   Problem: Clinical Measurements: Goal: Ability to maintain clinical measurements within normal limits will improve 09/27/2019 1107 by Shanon Ace, RN Outcome: Progressing 09/27/2019 1107 by Shanon Ace, RN Outcome: Progressing Goal: Will remain free from infection 09/27/2019 1107 by Shanon Ace, RN Outcome: Progressing 09/27/2019 1107 by Shanon Ace, RN Outcome: Progressing Goal: Diagnostic test results will improve 09/27/2019 1107 by Shanon Ace, RN Outcome: Progressing 09/27/2019 1107 by Shanon Ace, RN Outcome: Progressing Goal: Respiratory complications will improve 09/27/2019 1107 by Shanon Ace, RN Outcome: Progressing 09/27/2019 1107 by Shanon Ace, RN Outcome: Progressing Goal: Cardiovascular complication will be avoided 09/27/2019 1107 by Shanon Ace, RN Outcome: Progressing 09/27/2019 1107 by Shanon Ace, RN Outcome: Progressing   Problem: Nutrition: Goal: Adequate nutrition will be maintained 09/27/2019 1107 by Shanon Ace, RN Outcome: Progressing 09/27/2019 1107 by Shanon Ace, RN Outcome: Progressing   Problem: Coping: Goal: Level of anxiety will decrease 09/27/2019 1107 by Shanon Ace, RN Outcome: Progressing 09/27/2019 1107 by Shanon Ace, RN Outcome: Progressing   Problem: Elimination: Goal: Will not experience complications related to bowel motility 09/27/2019 1107 by Shanon Ace, RN Outcome:  Progressing 09/27/2019 1107 by Shanon Ace, RN Outcome: Progressing Goal: Will not experience complications related to urinary retention 09/27/2019 1107 by Shanon Ace, RN Outcome: Progressing 09/27/2019 1107 by Shanon Ace, RN Outcome: Progressing   Problem: Pain Managment: Goal: General experience of comfort will improve 09/27/2019 1107 by Shanon Ace, RN Outcome: Progressing 09/27/2019 1107 by Shanon Ace, RN Outcome: Progressing   Problem: Safety: Goal: Ability to remain free from injury will improve 09/27/2019 1107 by Shanon Ace, RN Outcome: Progressing 09/27/2019 1107 by Shanon Ace, RN Outcome: Progressing   Problem: Skin Integrity: Goal: Risk for impaired skin integrity will decrease 09/27/2019 1107 by Shanon Ace, RN Outcome: Progressing 09/27/2019 1107 by Shanon Ace, RN Outcome: Progressing

## 2019-09-27 NOTE — Progress Notes (Signed)
Assisted Vinnie Level, bedside RN, by changing the small Eakin's pouch for the [facility] fistula which was leaking. Skin was prepped with skin prep and layered with [employer] stoma powder. Used leftover Eakin's pouch material to fill in the umbilicus area and reinforced the edges with rectangular foam dressings.  This Lynann Beaver pouch is connected to a urinary drainage bag.

## 2019-09-28 LAB — GLUCOSE, CAPILLARY
Glucose-Capillary: 109 mg/dL — ABNORMAL HIGH (ref 70–99)
Glucose-Capillary: 114 mg/dL — ABNORMAL HIGH (ref 70–99)
Glucose-Capillary: 123 mg/dL — ABNORMAL HIGH (ref 70–99)
Glucose-Capillary: 126 mg/dL — ABNORMAL HIGH (ref 70–99)
Glucose-Capillary: 129 mg/dL — ABNORMAL HIGH (ref 70–99)
Glucose-Capillary: 178 mg/dL — ABNORMAL HIGH (ref 70–99)
Glucose-Capillary: 207 mg/dL — ABNORMAL HIGH (ref 70–99)
Glucose-Capillary: 44 mg/dL — CL (ref 70–99)
Glucose-Capillary: 95 mg/dL (ref 70–99)

## 2019-09-28 LAB — BASIC METABOLIC PANEL
Anion gap: 3 — ABNORMAL LOW (ref 5–15)
BUN: 53 mg/dL — ABNORMAL HIGH (ref 6–20)
CO2: 27 mmol/L (ref 22–32)
Calcium: 8.1 mg/dL — ABNORMAL LOW (ref 8.9–10.3)
Chloride: 104 mmol/L (ref 98–111)
Creatinine, Ser: 0.88 mg/dL (ref 0.44–1.00)
GFR calc Af Amer: >60 mL/min (ref >60)
GFR calc non Af Amer: >60 mL/min (ref >60)
Glucose, Bld: 143 mg/dL — ABNORMAL HIGH (ref 70–99)
Potassium: 4.6 mmol/L (ref 3.5–5.1)
Sodium: 134 mmol/L — ABNORMAL LOW (ref 135–145)

## 2019-09-28 LAB — URINE CULTURE: Culture: >=100000 — AB

## 2019-09-28 LAB — PHOSPHORUS: Phosphorus: 4.1 mg/dL (ref 2.5–4.6)

## 2019-09-28 LAB — MAGNESIUM: Magnesium: 2.1 mg/dL (ref 1.7–2.4)

## 2019-09-28 MED ORDER — TRACE MINERALS CU-MN-SE-ZN 300-55-60-3000 MCG/ML IV SOLN
INTRAVENOUS | Status: AC
  Filled 2019-09-28: qty 600

## 2019-09-28 MED ORDER — DEXTROSE 50 % IV SOLN
INTRAVENOUS | Status: AC
  Administered 2019-09-28: 50 mL
  Filled 2019-09-28: qty 50

## 2019-09-28 MED ORDER — INSULIN ASPART 100 UNIT/ML ~~LOC~~ SOLN
0.0000 [IU] | Freq: Three times a day (TID) | SUBCUTANEOUS | Status: DC
  Administered 2019-09-29: 3 [IU] via SUBCUTANEOUS

## 2019-09-28 MED ORDER — INSULIN ASPART 100 UNIT/ML ~~LOC~~ SOLN
0.0000 [IU] | SUBCUTANEOUS | Status: DC
  Administered 2019-09-28: 1 [IU] via SUBCUTANEOUS

## 2019-09-28 MED ORDER — INSULIN ASPART 100 UNIT/ML ~~LOC~~ SOLN
0.0000 [IU] | Freq: Every day | SUBCUTANEOUS | Status: DC
  Administered 2019-09-28: 2 [IU] via SUBCUTANEOUS

## 2019-09-28 NOTE — Plan of Care (Signed)
  Problem: Education: Goal: Knowledge of General Education information will improve Description: Including pain rating scale, medication(s)/side effects and non-pharmacologic comfort measures 09/28/2019 0722 by Shanon Ace, RN Outcome: Progressing 09/28/2019 0721 by Shanon Ace, RN Outcome: Progressing   Problem: Health Behavior/Discharge Planning: Goal: Ability to manage health-related needs will improve 09/28/2019 0722 by Shanon Ace, RN Outcome: Progressing 09/28/2019 0721 by Shanon Ace, RN Outcome: Progressing   Problem: Clinical Measurements: Goal: Ability to maintain clinical measurements within normal limits will improve Outcome: Progressing Goal: Will remain free from infection 09/28/2019 0722 by Shanon Ace, RN Outcome: Progressing 09/28/2019 0721 by Shanon Ace, RN Outcome: Progressing Goal: Diagnostic test results will improve 09/28/2019 0722 by Shanon Ace, RN Outcome: Progressing 09/28/2019 0721 by Shanon Ace, RN Outcome: Progressing Goal: Respiratory complications will improve 09/28/2019 0722 by Shanon Ace, RN Outcome: Progressing 09/28/2019 0721 by Shanon Ace, RN Outcome: Progressing Goal: Cardiovascular complication will be avoided Outcome: Progressing   Problem: Activity: Goal: Risk for activity intolerance will decrease Outcome: Progressing   Problem: Nutrition: Goal: Adequate nutrition will be maintained 09/28/2019 0722 by Shanon Ace, RN Outcome: Not Progressing 09/28/2019 0721 by Shanon Ace, RN Outcome: Progressing   Problem: Coping: Goal: Level of anxiety will decrease 09/28/2019 0722 by Shanon Ace, RN Outcome: Progressing 09/28/2019 0721 by Shanon Ace, RN Outcome: Progressing   Problem: Elimination: Goal: Will not experience complications related to bowel motility 09/28/2019 0722 by Shanon Ace, RN Outcome: Progressing 09/28/2019 0721 by Shanon Ace, RN Outcome: Progressing Goal: Will not experience  complications related to urinary retention Outcome: Progressing   Problem: Pain Managment: Goal: General experience of comfort will improve 09/28/2019 0722 by Shanon Ace, RN Outcome: Progressing 09/28/2019 0721 by Shanon Ace, RN Outcome: Progressing   Problem: Safety: Goal: Ability to remain free from injury will improve 09/28/2019 0722 by Shanon Ace, RN Outcome: Progressing 09/28/2019 0721 by Shanon Ace, RN Outcome: Progressing   Problem: Skin Integrity: Goal: Risk for impaired skin integrity will decrease 09/28/2019 0722 by Shanon Ace, RN Outcome: Progressing 09/28/2019 0721 by Shanon Ace, RN Outcome: Progressing

## 2019-09-28 NOTE — Progress Notes (Signed)
PROGRESS NOTE  Gina [name]  DOB: 07-30-1960  PCP: [name], MD CVE:[id number]  DOA: 09/28/2019  LOS: 16 days   Chief Complaint  Patient presents with  . low hbg   Brief narrative: 59 year old with past medical history significant for perforated prepyloric ulcer in 2020 status post exploratory laparotomy with [name] patch. She has an Chief Strategy Officer in place from January 2021 due to [facility] fistula and has been on TPN since 02/2019. She was recently admitted with candidemia, resulting in PICC replacement.   She also has history of insulin-dependent diabetes, chronic sacral pressure ulcer stage II, schizophrenia, bilateral BKA. She had been admitted a few times this year for sepsis including MRSA bacteremia, fungemia, Candida peritonitis in April, Proteus UTI, Candida glabrata for which she completed Eraxis on 6/26.   Patient presented to ED on 7/1 for evaluation of fever, hypotension and low hemoglobin.  In the ED her hemoglobin was stable around 7.7, hyponatremia, hyperglycemia. Fecal occult was noted to be positive.  Subjective: Patient was seen and examined this morning.  Middle-aged African-American female.  Looks much older for her age.  Looks depressed.  Sitting up in chair.  Nauseated.  Had one episode of vomiting this morning.  Poor appetite.  TPN off this morning.  Blood sugar level down to 60s for the next few hours. Chart reviewed.  No fever.  Heart rate in 70s, blood pressure mostly 140s, not on supplemental oxygen. BMP from this morning with normal creatinine, normal magnesium, potassium and phosphorus.  Last hemoglobin from 7/16 was 7.3. Patient apparently had an urine culture sent on 7/16 which is showing more than 100,000 CFU per mL of Klebsiella pneumoniae.  Assessment/Plan: Fever of unknown origin - POA -Repeat blood cultures, urinalysis and urine cultures negative. -CT scan abdomen pelvis chronic intra-abdominal collection, enterocutaneous fistula.  -Infectious disease  consulted, patient was restarted on Eraxis that she received for 2 days and that was discontinued.  -No evidence of recurrent fever or localization of infection.    Klebsiella in urine -7/16 -patient apparently had an urine culture sent on 7/16 which is showing more than 100,000 CFU per mL of Klebsiella pneumoniae. -Patient is not having any fever.  WBC count improving.  Monitor off antibiotics.  Acute on chronic anemia  Iron deficiency anemia  -Hemoccult positive stool POA -suspect chronic GI bleeding. -Hemoglobin 6.6 on admission.  Patient received 2 units of PRBC so far.  Last hemoglobin on 7/16 was 7.3.  Repeat hemoglobin tomorrow.  -Continue Protonix oral.   -Previous provider discussed with Dr. Paulita Fujita with [facility] and was recommended symptomatic treatment. -Received IV iron on 7/5 and 7/17.  History of Candida glabrata fungemia: Completed Eraxis on 6/26.  Hypertension -Blood pressure controlled on metoprolol: Amlodipine and hydralazine.  Type 2 diabetes, diet controlled: A1c 7 on 6/7. Hypoglycemia -Currently on TPN and on sliding scale insulin.  -With poor oral appetite, intermittent TPN and episodes of hypoglycemia, I would switch patient's sliding scale to very sensitive regimen.  Continue to monitor.  Sacral decubitus ulcer stage IV/left lower extremity stage II decubitus ulcer: Present on admission.  Local wound care.    Chronic pain due to bilateral below knee amputation: On gabapentin and oxycodone.  Acute kidney injury: AKI resolved.  Foley removed.    Voiding well.  Mobility: Limited because of bilateral BKA status Code Status:   Code Status: Partial Code  Nutritional status: Body mass index is 18.29 kg/m. Nutrition Problem: Moderate Malnutrition Etiology: chronic illness (EC fistula, perforated  prepyloric ulcer on TPN) Signs/Symptoms: percent weight loss, moderate fat depletion, moderate muscle depletion Percent weight loss: 9 % (in 1 mo) Diet Order             Diet heart healthy/carb modified Room service appropriate? Yes; Fluid consistency: Thin  Diet effective now                On TPN, managed by pharmacy.  IV fluids, electrolyte replacement along with TPN protocol. Needing high complexity of care , will benefit with placement at nursing home.  DVT prophylaxis: Unable to be on Lovenox/heparin because of acute on chronic GI bleeding.  Unable to have SCDs because of bilateral BKA status   Antimicrobials:  None Fluid: None Consultants:  Infectious disease, signed off Gastroenterology, curbside  Family Communication:  None at bedside  Status is: Inpatient  Remains inpatient appropriate because:Unsafe d/c plan   Dispo: The patient is from: Home              Anticipated d/c is to: SNF, LTAC, select              Anticipated d/c date is: 3 days       Patient was living at her home and her children were taking turns to take care of her.  They now say that they cannot provide any care at home.  Patient currently is not medically stable to d/c to home.  Recommend LTAC or skilled nursing facility. Waiting to go to some facilities that can manage the Eakin's patch and TPN.  Infusions:  . TPN CYCLIC-ADULT (ION)      Scheduled Meds: . amLODipine  10 mg Oral Daily  . bethanechol  5 mg Oral TID  . Chlorhexidine Gluconate Cloth  6 each Topical Daily  . cholestyramine  4 g Oral BID  . cyanocobalamin  1,000 mcg Intramuscular Once  . escitalopram  20 mg Oral Daily  . feeding supplement  1 Container Oral TID BM  . gabapentin  300 mg Oral BID  . hydrALAZINE  25 mg Oral Q8H  . insulin aspart  0-15 Units Subcutaneous Q4H  . metoprolol tartrate  50 mg Oral BID  . nystatin  5 mL Oral QID  . pantoprazole  40 mg Oral BID  . sodium chloride flush  10-40 mL Intracatheter Q12H  . tamsulosin  0.4 mg Oral QPC supper    Antimicrobials: Anti-infectives (From admission, onward)   Start     Dose/Rate Route Frequency Ordered Stop   09/13/19  1030  anidulafungin (ERAXIS) 100 mg in sodium chloride 0.9 % 100 mL IVPB  Status:  Discontinued        100 mg 78 mL/hr over 100 Minutes Intravenous Every 24 hours 09/12/19 0952 09/14/19 1230   09/12/19 1030  anidulafungin (ERAXIS) 200 mg in sodium chloride 0.9 % 200 mL IVPB  Status:  Discontinued        200 mg 78 mL/hr over 200 Minutes Intravenous  Once 09/12/19 0952 09/15/19 0903      PRN meds: acetaminophen, ondansetron (ZOFRAN) IV, oxyCODONE, prochlorperazine, sodium chloride flush   Objective: Vitals:   09/27/19 1955 09/28/19 0441  BP: (!) 141/70 (!) 144/80  Pulse: 70 76  Resp: 18 18  Temp: 97.9 F (36.6 C) 98 F (36.7 C)  SpO2: 98% 100%    Intake/Output Summary (Last 24 hours) at 09/28/2019 1056 Last data filed at 09/28/2019 0901 Gross per 24 hour  Intake 1971.85 ml  Output 650 ml  Net 1321.85 ml  Filed Weights   09/13/2019 1521  Weight: 45.4 kg   Weight change:  Body mass index is 18.29 kg/m.   Physical Exam: General exam: Appears calm and comfortable.  Nauseated Skin: No rashes, lesions or ulcers. HEENT: Atraumatic, normocephalic, supple neck, no obvious bleeding Lungs: Clear to auscultation bilaterally CVS: Regular rate and rhythm, no murmur GI/Abd soft, nondistended, nontender, drain in place. CNS: Alert, awake, able to answer simple questions Psychiatry: Depressed look Extremities: Bilateral BKA status  Data Review: I have personally reviewed the laboratory data and studies available.  Recent Labs  Lab 09/22/19 0333 09/26/19 1014  WBC 6.2 7.0  NEUTROABS 3.9 4.4  HGB 7.8* 7.3*  HCT 24.0* 23.6*  MCV 94.1 95.2  PLT 317 254   Recent Labs  Lab 09/22/19 0333 09/22/19 0333 09/23/19 0357 09/23/19 0357 09/24/19 0313 09/25/19 0440 09/26/19 0331 09/27/19 0350 09/28/19 0435  NA 139   < > 136   < > 136 135 137 135 134*  K 3.7   < > 3.2*   < > 4.8 4.7 4.8 4.6 4.6  CL 103   < > 99   < > 101 100 103 104 104  CO2 30   < > 29   < > 29 29 29 25 27     GLUCOSE 196*   < > 155*   < > 145* 83 136* 249* 143*  BUN 45*   < > 40*   < > 44* 51* 53* 51* 53*  CREATININE 0.74   < > 0.71   < > 0.76 0.79 0.85 0.86 0.88  CALCIUM 8.0*   < > 7.8*   < > 7.9* 7.9* 8.0* 7.8* 8.1*  MG 2.0  --  2.1  --  2.3 2.3  --   --  2.1  PHOS 3.0  --  3.5  --  4.0 3.9  --   --  4.1   < > = values in this interval not displayed.    Signed, Terrilee Croak, MD [facility] Pager: [phone] (Secure Chat preferred). 09/28/2019

## 2019-09-28 NOTE — Progress Notes (Signed)
PHARMACY - TOTAL PARENTERAL NUTRITION CONSULT NOTE  Indication:  EC fistula  Patient Measurements: Height: 5\' 2"  (157.5 cm) Weight: 45.4 kg (100 lb) IBW/kg (Calculated) : 50.1 TPN AdjBW (KG): 45.4 Body mass index is 18.29 kg/m. Usual Weight: 53-60 kg  Assessment:  79 YOF presents on 09/28/2019 with anemia, likely concurrent GIB. Patient has a history of perforated prepyloric ulcer s/p Delford Field.  She has an Chief Strategy Officer in place from January 2021 due to [facility] fistula and has been on TPN since 02/2019.  She was recently admitted with candidemia, resulting in PICC replacement.  Pharmacy consulted to continue TPN.  Patient reports not missing any TPN bags PTA. Per further discussion with MD on 7/2, held off on starting TPN initially due to fever and recent fungemia. TPN resumed 7/5. ID monitoring off antibiotics for now.  Glucose / Insulin: Hx DM controlled by diet outpatient. Required insulin gtt last admit. CBGs 196-222 on cyclic TPN (improved) and 55-216 off TPN. Hypoglycemic and dextrose administered 7/17 AM. Utilized 15 units / 24 hours. Electrolytes: Na 134. Phos 4.1. Other electrolytes wnl.  Renal: AKI resolved - SCr 0.88 (BL 0.6-0.8), BUN 53. UOP 0.6 ml/kg/hr (decrease though 1 unmeasured urine occurrence) LFTs / TGs: LFTs WNL, Tbili low, TG WNL Prealbumin / albumin: Prealbumin increased to 8.5. Albumin 1.5 Intake / Output; MIVF: No output from EC fistula documented GI Imaging: 7/2 CT abd/pelvis - similar extent, increased fullness of chronic intra-abdominal collection associated w/ known ECF, cholelithiasis Surgeries / Procedures: none since admit  Central access: PICC removed 6/12; replaced 6/16 TPN start date: chronic TPN  Nutritional Goals (Chronic TPN formula, per [facility] staff on 7/2): kCal: 1472 kCal, Protein (concentrated AA): 90g, CHO 180g, ILE 50g, no insulin Infuse 1800 mL over 14 hrs  RD goals as of 7/13: 1600-1800 kCal, 75-90g protein, > 1.6L fluid per  day  Current Nutrition:  HH/CMD - poor appetite, per RN pt has to be encouraged to eat and has not wanted breakfast yet  Boost TID between meals - Patient had 3 boost supplements yesterday and reports drinking all of them per daytime RN had 1 full one with breakfast and 3/4 of one later on  Plan:  Continue cyclic TPN 9798 mL over 14 hours - 69 mL/hr x 1 hr; then 138 mL/hr x 12 hrs; then 69 mL/hr x 1 hr  TPN provides 90 g AA, 216 g CHO, and 49 g ILE, for total 1580 kCal; meeting 100% of protein and 99% of kCal goals. Adjusted electrolytes in TPN: increase Na to 115 mEq/L, no other electrolyte adjustments but monitor phos closely for adjustment  Add standard MVI and trace elements to TPN Continue no insulin in TPN and reduce SSI back to mSSI - continue to follow poor oral intake and adjust accordingly as CBGs have been widely fluctuating due to oral consumption  Monitor TPN labs Monday/Thursday Monitor discharge plan - planning SNF or LTACH Follow up new weight being obtained from RN and follow up with RD on current nutritional goals given poor oral intake   Cristela Felt, PharmD Clinical Pharmacist  09/28/2019 7:28 AM

## 2019-09-29 LAB — CBC WITH DIFFERENTIAL/PLATELET
Abs Immature Granulocytes: 0.02 10*3/uL (ref 0.00–0.07)
Basophils Absolute: 0 10*3/uL (ref 0.0–0.1)
Basophils Relative: 0 %
Eosinophils Absolute: 0.1 10*3/uL (ref 0.0–0.5)
Eosinophils Relative: 1 %
HCT: 23.4 % — ABNORMAL LOW (ref 36.0–46.0)
Hemoglobin: 7 g/dL — ABNORMAL LOW (ref 12.0–15.0)
Immature Granulocytes: 0 %
Lymphocytes Relative: 18 %
Lymphs Abs: 0.9 10*3/uL (ref 0.7–4.0)
MCH: 29.8 pg (ref 26.0–34.0)
MCHC: 29.9 g/dL — ABNORMAL LOW (ref 30.0–36.0)
MCV: 99.6 fL (ref 80.0–100.0)
Monocytes Absolute: 0.4 10*3/uL (ref 0.1–1.0)
Monocytes Relative: 8 %
Neutro Abs: 3.5 10*3/uL (ref 1.7–7.7)
Neutrophils Relative %: 73 %
Platelets: 233 10*3/uL (ref 150–400)
RBC: 2.35 MIL/uL — ABNORMAL LOW (ref 3.87–5.11)
RDW: 16.4 % — ABNORMAL HIGH (ref 11.5–15.5)
WBC: 4.8 10*3/uL (ref 4.0–10.5)
nRBC: 0 % (ref 0.0–0.2)

## 2019-09-29 LAB — GLUCOSE, CAPILLARY
Glucose-Capillary: 40 mg/dL — CL (ref 70–99)
Glucose-Capillary: 255 mg/dL — ABNORMAL HIGH (ref 70–99)
Glucose-Capillary: 193 mg/dL — ABNORMAL HIGH (ref 70–99)

## 2019-09-29 LAB — COMPREHENSIVE METABOLIC PANEL
ALT: 9 U/L (ref 0–44)
AST: 16 U/L (ref 15–41)
Albumin: 1.5 g/dL — ABNORMAL LOW (ref 3.5–5.0)
Alkaline Phosphatase: 88 U/L (ref 38–126)
Anion gap: 5 (ref 5–15)
BUN: 53 mg/dL — ABNORMAL HIGH (ref 6–20)
CO2: 26 mmol/L (ref 22–32)
Calcium: 8 mg/dL — ABNORMAL LOW (ref 8.9–10.3)
Chloride: 105 mmol/L (ref 98–111)
Creatinine, Ser: 0.93 mg/dL (ref 0.44–1.00)
GFR calc Af Amer: >60 mL/min (ref >60)
GFR calc non Af Amer: >60 mL/min (ref >60)
Glucose, Bld: 278 mg/dL — ABNORMAL HIGH (ref 70–99)
Potassium: 4.4 mmol/L (ref 3.5–5.1)
Sodium: 136 mmol/L (ref 135–145)
Total Bilirubin: 0.5 mg/dL (ref 0.3–1.2)
Total Protein: 6.3 g/dL — ABNORMAL LOW (ref 6.5–8.1)

## 2019-09-29 LAB — PREALBUMIN: Prealbumin: 11.7 mg/dL — ABNORMAL LOW (ref 18–38)

## 2019-09-29 LAB — PHOSPHORUS: Phosphorus: 4 mg/dL (ref 2.5–4.6)

## 2019-09-29 LAB — MAGNESIUM: Magnesium: 2 mg/dL (ref 1.7–2.4)

## 2019-09-29 LAB — TRIGLYCERIDES: Triglycerides: 106 mg/dL (ref <150)

## 2019-09-29 MED ORDER — SODIUM CHLORIDE 0.9 % IV SOLN
1.0000 g | INTRAVENOUS | Status: DC
  Administered 2019-09-29 – 2019-10-02 (×4): 1 g via INTRAVENOUS
  Filled 2019-09-29 (×2): qty 10
  Filled 2019-09-29 (×3): qty 1

## 2019-09-29 MED ORDER — INSULIN ASPART 100 UNIT/ML ~~LOC~~ SOLN
0.0000 [IU] | SUBCUTANEOUS | Status: DC
  Administered 2019-09-29 – 2019-09-30 (×2): 3 [IU] via SUBCUTANEOUS
  Administered 2019-09-30: 5 [IU] via SUBCUTANEOUS
  Administered 2019-09-30: 2 [IU] via SUBCUTANEOUS
  Administered 2019-10-01: 5 [IU] via SUBCUTANEOUS
  Administered 2019-10-01: 3 [IU] via SUBCUTANEOUS

## 2019-09-29 MED ORDER — TRACE MINERALS CU-MN-SE-ZN 300-55-60-3000 MCG/ML IV SOLN
INTRAVENOUS | Status: AC
  Filled 2019-09-29: qty 600

## 2019-09-29 NOTE — Progress Notes (Signed)
PHARMACY - TOTAL PARENTERAL NUTRITION CONSULT NOTE  Indication:  EC fistula  Patient Measurements: Height: 5\' 2"  (157.5 cm) Weight: 62.6 kg (138 lb) IBW/kg (Calculated) : 50.1 TPN AdjBW (KG): 45.4 Body mass index is 25.24 kg/m. Usual Weight: 53-60 kg  Assessment:  77 YOF presents on 09/22/2019 with anemia, likely concurrent GIB. Patient has a history of perforated prepyloric ulcer s/p Delford Field.  She has an Chief Strategy Officer in place from January 2021 due to [facility] fistula and has been on TPN since 02/2019.  She was recently admitted with candidemia, resulting in PICC replacement.  Pharmacy consulted to continue TPN.  Patient reports not missing any TPN bags PTA. Per further discussion with MD on 7/2, held off on starting TPN initially due to fever and recent fungemia. TPN resumed 7/5. ID monitoring off antibiotics for now.  Glucose / Insulin: Hx DM controlled by diet outpatient. Required insulin gtt last admit. CBGs 161-096 while on cyclic TPN and 04-540 off TPN. Hypoglycemic and dextrose administered 7/17 AM. Utilized 15 units / 24 hours. Electrolytes: Na 136. Other electrolytes wnl.  Renal: AKI resolved - SCr wnl (BL 0.6-0.8), BUN 53. UOP 1 ml/kg/hr (decrease though 1 unmeasured urine occurrence) LFTs / TGs: LFTs WNL, Tbili low, TG WNL Prealbumin / albumin: Prealbumin increased to 11.7. Albumin 1.5 Intake / Output; MIVF: 1600 mL from EC fistula, -4.8L yesterday  GI Imaging: 7/2 CT abd/pelvis - similar extent, increased fullness of chronic intra-abdominal collection associated w/ known ECF, cholelithiasis Surgeries / Procedures: none since admit  Central access: PICC removed 6/12; replaced 6/16 TPN start date: chronic TPN  Nutritional Goals (Chronic TPN formula, per [facility] staff on 7/2): kCal: 1472 kCal, Protein (concentrated AA): 90g, CHO 180g, ILE 50g, no insulin Infuse 1800 mL over 14 hrs  RD goals as of 7/13: 1600-1800 kCal, 75-90g protein, > 1.6L fluid per day  Current  Nutrition:  HH/CMD - poor appetite, per RN pt has to be encouraged to eat and has not wanted breakfast yet  Boost TID between meals - Patient had 2/3 boost supplements yesterday   Plan:  Continue cyclic TPN 9811 mL over 14 hours - 69 mL/hr x 1 hr; then 138 mL/hr x 12 hrs; then 69 mL/hr x 1 hr  TPN provides 90 g AA, 216 g CHO, and 49 g ILE, for total 1580 kCal; meeting 100% of protein and 99% of kCal goals. Adjusted electrolytes in TPN: None, Monitor phos closely for adjustment  Add standard MVI and trace elements to TPN Continue no insulin in TPN. Will add a moderate SSI ONLY while patient is receiving TPN. No insulin at all while she is off TPN - continue to follow poor oral intake and adjust accordingly as CBGs have been widely fluctuating due to oral consumption. Plan was discussed with MD  Monitor TPN labs Monday/Thursday Monitor discharge plan - planning SNF or LTACH Follow up new weight being obtained from RN and follow up with RD on current nutritional goals given poor oral intake   Albertina Parr, PharmD., BCPS, BCCCP Clinical Pharmacist Clinical phone for 09/29/19 until 3:30pm: [phone] If after 3:30pm, please refer to [facility] for unit-specific pharmacist

## 2019-09-29 NOTE — Progress Notes (Addendum)
[facility] PROGRESS NOTE  Gina [name] YQI:[id number] DOB: 12-25-1960 DOA: 09/30/2019 PCP: [name], MD  Status: Inpatient---Remains inpatient appropriate because:Ongoing diagnostic testing needed not appropriate for outpatient work up, Unsafe d/c plan and IV treatments appropriate due to intensity of illness or inability to take PO-high level of care that family is unable to provide   Dispo: The patient is from: Home              Anticipated d/c is to: SNF              Anticipated d/c date is: > 3 days              Patient currently is medically stable to d/c.  On [name] barrier to discharge is SNF that is capable of managing chronic TPN as well as Eakin's pouch over EC fistula. Previously patient's daughter quit her job to care for her mother 5 days a week. Care was provided both by daughter and son. Daughter also has children she must take care of. Unfortunately it was not financially feasible for the daughter to remain out of work so she has had to return to work and therefore patient did not have appropriate level of 24/7 care available at discharge.  Code Status: Partial: DNR except okay to use NIMV/BiPAP Family Communication: Patient only DVT prophylaxis: Low risk secondary to chronicity of current issues and bilateral lower extremity amputations therefore no indication for pharmacological DVT prophylaxis or SCDs  HPI: 59 year old with past medical history significant for perforated prepyloric ulcer in 2020 status post exploratory laparotomy with [name] patch, chronic TPN via right arm PICC line, insulin-dependent diabetes, history of bilateral lower quadrant drains along with enterocutaneous fistula with colostomy bag, chronic sacral pressure ulcer stage II, schizophrenia, bilateral BKA who presents for evaluation of fever. She has been admitted a few times this year for sepsis including MRSA bacteremia, fungemia, Candida peritonitis in April, Proteus UTI, Candida glabrata who  completed Eraxis 6/26. Presents with persistent fevers and low hemoglobin. Patient reported that she was sent to the ED due to low hemoglobin. Patient was sent from PCP office due to hypotension, systolic blood pressure in the 60s, patient had an episode of nausea and vomiting. In the ED her hemoglobin was stable around 7.7, hyponatremia, hyperglycemia. Fecal occult was noted to be positive.  PTA she lived at home and her children were taking turns to take care of her.  Now they cannot provide any care at home.  Unable to be accepted by multiple LTAC or skilled nursing facilities.  She does want to go home, however does not have any support.  Subjective:  Continues to have nausea and mild suprapubic discomfort  Objective: Vitals:   09/28/19 2053 09/29/19 0523  BP: (!) 153/81 (!) 150/68  Pulse: 73 86  Resp: 16 18  Temp: 97.7 F (36.5 C) 98.8 F (37.1 C)  SpO2: 94% 94%    Intake/Output Summary (Last 24 hours) at 09/29/2019 1402 Last data filed at 09/29/2019 0600 Gross per 24 hour  Intake 0 ml  Output 4850 ml  Net -4850 ml   Filed Weights   09/18/2019 1521 09/28/19 1509 09/28/19 1935  Weight: 45.4 kg 62.6 kg 62.6 kg    Exam: Constitutional: NAD, calm, uncomfortable 2/2 buttock discomfort  Respiratory: clear to auscultation bilaterally. Normal respiratory effort.  Air 96% sat Cardiovascular: Regular rate and rhythm, no murmurs / rubs / gallops. No extremity edema. 2+ pedal pulses.  Abdomen: no tenderness, no masses palpated.  Sounds present.  TPN infusing for nutrition.  Eakin's pouch over EC fistula with feculent drainage to collection bag-only tender over suprapubic area Musculoskeletal: no clubbing / cyanosis. No joint deformity upper and lower extremities.  bilateral lower extremity amputations.  Good ROM, no contractures. Normal muscle tone.  Skin: no rashes, lesions, ulcers. No induration Neurologic: CN 2-12 grossly intact. Sensation intact, DTR normal. Strength 3-4/5 x all 4  extremities.  Psychiatric:  Alert and oriented x 3. Normal mood.    Assessment/Plan: Fever of unknown origin, present on admission:  History of Candida glabrata fungemia Currently no evidence of infection. Repeat blood cultures, urinalysis and urine cultures negative. Infectious disease consulted, patient was restarted on Eraxis that she received for 2 days and that was discontinued as of\26 No evidence of recurrent fever or localization of infection.  Monitoring off antibiotics.  Afebrile now. CT scan abdomen pelvis chronic intra-abdominal collection, enterocutaneous fistula. Symptomatic treatment.  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 Surgery okay with comfort feeds chronic for this patient Continue parenteral nutrition Tenuous with large volume liquid stool output from colostomy as well as large volume feculent appearing material out of EC fistula into Eakin's pouch Due to level of patient's care which requires 24/7 attention family unable to manage patient at home family unable to manage patient at home (daughter who had previously stopped working to take care of mother had to return to work) therefore plan is to discharge to skilled nursing facility-has Medicaid  **/19 discussed with surgery PA and no indication at this juncture to reimage since no change in drainage and no increase in abdominal discomfort.  Klebsiella UTI Simply had Foley catheter discontinued late last week Has been complaining of nausea and suprapubic discomfort Culture positive for >100,000 colonies pansensitive (resistant to ampicillin only) Klebsiella WBC is normal with only 1 low-grade fever 99 F in the past 24 hours Given persistent nausea and suprapubic pain will initiate IV Rocephin for at least 5 days given recent Foley as well as chronic intra-abdominal fluid collection secondary to enterocutaneous fistula which could be causing translocation of bacteria into the urinary  tract  Anemia:  Iron deficiency with likely acute on chronic anemia as well as malnutrition. Hemoccult positive stool present on admission.  Suspect chronic GI bleeding. Hemoglobin 6.6 - 1 unit of PRBC-  8.5>7.4>7>6.6- 1 unit PRBC -8.5>7.5.  7/19 hemoglobin back down to 7.0 Continue IV Protonix, will change to p.o. Protonix on discharge. Previous provider discussed with Dr. Paulita Fujita with [facility] and they recommended symptomatic treatment. Received IV iron on 7/5.   Was given a repeat dose on 7/13 B12 continues to trend downward and was low at 402 on 6/7 therefore will check-suspect would have malabsorption syndrome given above surgical issues therefore may benefit from at least monthly B12 injections although if levels low enough may need to give weekly for 4 weeks.  Hypotension (resolved) Hypertension:  Etiology to hypotension 2/2 hypovolemia.     Continue metoprolol  Started patient on amlodipine and hydralazine.    Type 2 diabetes, diet controlled:  Currently on TPN and remains on sliding scale insulin with readings between 101 and 194 Insulin adjusted in TPN per pharmacy.  Sacral decubitus ulcer stage IV/left lower extremity stage II decubitus ulcer:  Present on admission.   Sacral coccyx ulcer -Reevaluated by [place] RN on 7/14.  Wound now measures 3.1 x 1 x 0.2 cm with 102% slick dark pink bed.  No longer meets requirement of stage IV decubitus.  Santyl has  been discontinued in small piece of Xeroform dressing placed into the gluteal fold wound with recommendations to cover with foam dressing and change daily. Continue air mattress.  Order placed for chair pad. Foley catheter discontinued Continue to follow left lower extremity decubitus  Chronic pain due to bilateral below knee amputation:  On gabapentin and oxycodone.  Acute kidney injury/history of urinary retention:  AKI resolved.   She had retained urine multiple times - Foley in but will try to discontinue on 7/14 and  follow bladder scans for recurrent urinary retention We will also start Flomax and bethanechol   Nutrition Status: Nutrition Problem: Moderate Malnutrition Etiology: chronic illness (EC fistula, perforated prepyloric ulcer on TPN) Signs/Symptoms: percent weight loss, moderate fat depletion, moderate muscle depletion Percent weight loss: 9 % (in 1 mo) Interventions: Refer to RD note for recommendations **On TPN, managed by pharmacy.  IV fluids, electrolyte replacement along with TPN protocol. Needing high complexity of care , will benefit with placement at nursing home. Estimated body mass index is 25.24 kg/m as calculated from the following:   Height as of this encounter: 5\' 2"  (1.575 m).   Weight as of this encounter: 62.6 kg.    Data Reviewed: Basic Metabolic Panel: Recent Labs  Lab 09/23/19 0357 09/23/19 0357 09/24/19 0313 09/24/19 0313 09/25/19 0440 09/26/19 0331 09/27/19 0350 09/28/19 0435 09/29/19 0400  NA 136   < > 136   < > 135 137 135 134* 136  K 3.2*   < > 4.8   < > 4.7 4.8 4.6 4.6 4.4  CL 99   < > 101   < > 100 103 104 104 105  CO2 29   < > 29   < > 29 29 25 27 26   GLUCOSE 155*   < > 145*   < > 83 136* 249* 143* 278*  BUN 40*   < > 44*   < > 51* 53* 51* 53* 53*  CREATININE 0.71   < > 0.76   < > 0.79 0.85 0.86 0.88 0.93  CALCIUM 7.8*   < > 7.9*   < > 7.9* 8.0* 7.8* 8.1* 8.0*  MG 2.1  --  2.3  --  2.3  --   --  2.1 2.0  PHOS 3.5  --  4.0  --  3.9  --   --  4.1 4.0   < > = values in this interval not displayed.   Liver Function Tests: Recent Labs  Lab 09/25/19 0440 09/29/19 0400  AST 20 16  ALT 15 9  ALKPHOS 96 88  BILITOT 0.2* 0.5  PROT 6.3* 6.3*  ALBUMIN 1.5* 1.5*   No results for input(s): LIPASE, AMYLASE in the last 168 hours. No results for input(s): AMMONIA in the last 168 hours. CBC: Recent Labs  Lab 09/26/19 1014 09/29/19 0400  WBC 7.0 4.8  NEUTROABS 4.4 3.5  HGB 7.3* 7.0*  HCT 23.6* 23.4*  MCV 95.2 99.6  PLT 254 233   Cardiac  Enzymes: No results for input(s): CKTOTAL, CKMB, CKMBINDEX, TROPONINI in the last 168 hours. BNP (last 3 results) No results for input(s): BNP in the last 8760 hours.  ProBNP (last 3 results) No results for input(s): PROBNP in the last 8760 hours.  CBG: Recent Labs  Lab 09/28/19 1132 09/28/19 1540 09/28/19 1822 09/28/19 2051 09/29/19 0758  GLUCAP 95 114* 109* 207* 255*    Recent Results (from the past 240 hour(s))  Culture, Urine     Status: Abnormal  Collection Time: 09/26/19  6:43 PM   Specimen: Urine, Catheterized  Result Value Ref Range Status   Specimen Description URINE, CATHETERIZED  Final   Special Requests   Final    NONE Performed at [facility], [address], [place], [place] [address]    Culture >=100,000 COLONIES/mL KLEBSIELLA PNEUMONIAE (A)  Final   Report Status 09/28/2019 FINAL  Final   Organism ID, Bacteria KLEBSIELLA PNEUMONIAE (A)  Final      Susceptibility   Klebsiella pneumoniae - MIC*    AMPICILLIN >=32 RESISTANT Resistant     CEFAZOLIN <=4 SENSITIVE Sensitive     CEFTRIAXONE <=0.25 SENSITIVE Sensitive     CIPROFLOXACIN <=0.25 SENSITIVE Sensitive     GENTAMICIN <=1 SENSITIVE Sensitive     IMIPENEM <=0.25 SENSITIVE Sensitive     NITROFURANTOIN 64 INTERMEDIATE Intermediate     TRIMETH/SULFA <=20 SENSITIVE Sensitive     AMPICILLIN/SULBACTAM 4 SENSITIVE Sensitive     PIP/TAZO <=4 SENSITIVE Sensitive     * >=100,000 COLONIES/mL KLEBSIELLA PNEUMONIAE     Studies: No results found.  Scheduled Meds: . amLODipine  10 mg Oral Daily  . bethanechol  5 mg Oral TID  . Chlorhexidine Gluconate Cloth  6 each Topical Daily  . cholestyramine  4 g Oral BID  . cyanocobalamin  1,000 mcg Intramuscular Once  . escitalopram  20 mg Oral Daily  . feeding supplement  1 Container Oral TID BM  . gabapentin  300 mg Oral BID  . hydrALAZINE  25 mg Oral Q8H  . insulin aspart  0-15 Units Subcutaneous 3 times per day  . metoprolol tartrate  50 mg Oral BID  .  nystatin  5 mL Oral QID  . pantoprazole  40 mg Oral BID  . sodium chloride flush  10-40 mL Intracatheter Q12H  . tamsulosin  0.4 mg Oral QPC supper   Continuous Infusions: . cefTRIAXone (ROCEPHIN)  IV 1 g (09/29/19 1034)  . TPN CYCLIC-ADULT (ION)      Principal Problem:   Acute GI bleeding Active Problems:   Hyperglycemia (glucose 203 on admission)   HTN (hypertension)   Type 2 diabetes mellitus with other circulatory complications (HCC)   Severe protein-calorie malnutrition (HCC)   Anemia of chronic disease   Schizophrenia (HCC)   Sacral decubitus ulcer, stage II   Below-knee amputee (HCC)   Intra-abdominal fluid collection, chronic   S/P BKA (below knee amputation) bilateral (HCC)   Enterocutaneous fistula   Fever   Iron deficiency anemia due to chronic blood loss   Hypoalbuminemia   On total parenteral nutrition (TPN)   PVD (peripheral vascular disease) (HCC) s/p bilateral BKAs   Hypotension   Hyponatremia (Sodium 131 on admission)   PUD (peptic ulcer disease) with history of perforated gastric ulcer s/p [name] patch   Chronic pain syndrome   Proteinuria   Hypomagnesemia   Hypophosphatemia   Stage 4 pressure ulcer ([place]) of bilateral coccyx   Pressure ulcer, unstageable ([place]) to right distal knee, left distal knee   Pressure ulcer of left mid thigh, stage 2 (HCC)   Pressure injury, stage 3 ([place]) to mid sacrum, left leg and right leg   Underweight   Encounter for blood transfusion on 09/13/19   Acute urinary retention   Hypoglycemia   Consultants:  Infectious disease, signed off  Gastroenterology, curbside  Procedures:  None  Antibiotics: Anti-infectives (From admission, onward)   Start     Dose/Rate Route Frequency Ordered Stop   09/29/19 1000  cefTRIAXone (ROCEPHIN) 1 g in sodium chloride 0.9 % 100 mL IVPB     Discontinue     1 g 200 mL/hr over 30 Minutes Intravenous Every 24 hours 09/29/19 0841 10/06/19 0959   09/13/19 1030  anidulafungin (ERAXIS) 100  mg in sodium chloride 0.9 % 100 mL IVPB  Status:  Discontinued        100 mg 78 mL/hr over 100 Minutes Intravenous Every 24 hours 09/12/19 0952 09/14/19 1230   09/12/19 1030  anidulafungin (ERAXIS) 200 mg in sodium chloride 0.9 % 200 mL IVPB  Status:  Discontinued        200 mg 78 mL/hr over 200 Minutes Intravenous  Once 09/12/19 8138 09/15/19 8719       Time spent: [place] ANP  [facility] Pager [phone]. If 7PM-7AM, please contact night-coverage at [email or web address] 09/29/2019, 2:02 PM  LOS: 17 days

## 2019-09-29 NOTE — Progress Notes (Signed)
Dressing changed as ordered. Pt tolerated well. Will continue to monitor.

## 2019-09-29 NOTE — Progress Notes (Signed)
Inpatient Diabetes Program Recommendations  AACE/ADA: New Consensus Statement on Inpatient Glycemic Control (2015)  Target Ranges:  Prepandial:   less than 140 mg/dL      Peak postprandial:   less than 180 mg/dL (1-2 hours)      Critically ill patients:  140 - 180 mg/dL   Lab Results  Component Value Date   GLUCAP 255 (H) 09/29/2019   HGBA1C 7.0 (H) 08/18/2019    Review of Glycemic Control Results for Gina [name], Gina [name] (MRN [id number]) as of 09/29/2019 09:46  Ref. Range 09/26/2019 20:28 09/27/2019 00:11 09/27/2019 04:17 09/27/2019 07:39 09/27/2019 11:48 09/27/2019 11:49 09/27/2019 12:23 09/27/2019 12:50 09/27/2019 13:10 09/27/2019 13:40 09/27/2019 14:12 09/27/2019 15:51 09/27/2019 19:53 09/28/2019 00:11 09/28/2019 04:40 09/28/2019 07:25 09/28/2019 10:33 09/28/2019 10:36 09/28/2019 10:50 09/28/2019 11:32 09/28/2019 15:40 09/28/2019 18:22 09/28/2019 20:51 09/29/2019 07:58  Glucose-Capillary Latest Ref Range: 70 - 99 mg/dL 284 (H) 261 (H) 228 (H) 216 (H) 55 (L) 54 (L) 59 (L) 62 (L) 63 (L) 64 (L) 96 87 124 (H) 178 (H) 129 (H) 123 (H) 40 (LL) 44 (LL) 126 (H) 95 114 (H) 109 (H) 207 (H) 255 (H)   Diabetes history: DM  Outpatient Diabetes medications: has been on basal bolus in the past, insulin needs decreased dramatically after perforated ulcer Current orders for Inpatient glycemic control: Novolog 0-15 units tid  TPN ordered without insulin added  Inpatient Diabetes Program Recommendations:    Insulin potentially stacking leading to hypoglycemia.   - consider decreasing Novolog Correction to 0-9 units Q6 hours.  Thanks,  Tama Headings RN, MSN, BC-ADM Inpatient Diabetes Coordinator Team Pager [phone] (8a-5p)

## 2019-09-30 LAB — BASIC METABOLIC PANEL
Anion gap: 5 (ref 5–15)
BUN: 59 mg/dL — ABNORMAL HIGH (ref 6–20)
CO2: 26 mmol/L (ref 22–32)
Calcium: 8.3 mg/dL — ABNORMAL LOW (ref 8.9–10.3)
Chloride: 106 mmol/L (ref 98–111)
Creatinine, Ser: 1 mg/dL (ref 0.44–1.00)
GFR calc Af Amer: >60 mL/min (ref >60)
GFR calc non Af Amer: >60 mL/min (ref >60)
Glucose, Bld: 151 mg/dL — ABNORMAL HIGH (ref 70–99)
Potassium: 4.3 mmol/L (ref 3.5–5.1)
Sodium: 137 mmol/L (ref 135–145)

## 2019-09-30 LAB — VITAMIN B12: Vitamin B-12: 710 pg/mL (ref 180–914)

## 2019-09-30 LAB — GLUCOSE, CAPILLARY
Glucose-Capillary: 135 mg/dL — ABNORMAL HIGH (ref 70–99)
Glucose-Capillary: 155 mg/dL — ABNORMAL HIGH (ref 70–99)
Glucose-Capillary: 206 mg/dL — ABNORMAL HIGH (ref 70–99)

## 2019-09-30 MED ORDER — SODIUM CHLORIDE 0.9 % IV SOLN: INTRAVENOUS | Status: DC

## 2019-09-30 MED ORDER — TRACE MINERALS CU-MN-SE-ZN 300-55-60-3000 MCG/ML IV SOLN
INTRAVENOUS | Status: AC
  Filled 2019-09-30: qty 600

## 2019-09-30 MED ORDER — BOOST / RESOURCE BREEZE PO LIQD CUSTOM
1.0000 | ORAL | Status: DC
  Administered 2019-09-30 – 2019-10-01 (×3): 1 via ORAL

## 2019-09-30 NOTE — Progress Notes (Signed)
[facility] PROGRESS NOTE  Gina [name] ERX:[id number] DOB: Feb 02, 1961 DOA: 09/21/2019 PCP: [name], MD  Status: Inpatient---Remains inpatient appropriate because:Ongoing diagnostic testing needed not appropriate for outpatient work up, Unsafe d/c plan and IV treatments appropriate due to intensity of illness or inability to take PO-high level of care that family is unable to provide   Dispo: The patient is from: Home              Anticipated d/c is to: SNF              Anticipated d/c date is: > 3 days              Patient currently is medically stable to d/c.  On [name] barrier to discharge is SNF that is capable of managing chronic TPN as well as Eakin's pouch over EC fistula. Previously patient's daughter quit her job to care for her mother 5 days a week. Care was provided both by daughter and son. Daughter also has children she must take care of. Unfortunately it was not financially feasible for the daughter to remain out of work so she has had to return to work and therefore patient did not have appropriate level of 24/7 care available at discharge.  Code Status: Partial: DNR except okay to use NIMV/BiPAP Family Communication: Patient only DVT prophylaxis: Low risk secondary to chronicity of current issues and bilateral lower extremity amputations therefore no indication for pharmacological DVT prophylaxis or SCDs  HPI: 59 year old with past medical history significant for perforated prepyloric ulcer in 2020 status post exploratory laparotomy with [name] patch, chronic TPN via right arm PICC line, insulin-dependent diabetes, history of bilateral lower quadrant drains along with enterocutaneous fistula with colostomy bag, chronic sacral pressure ulcer stage II, schizophrenia, bilateral BKA who presents for evaluation of fever. She has been admitted a few times this year for sepsis including MRSA bacteremia, fungemia, Candida peritonitis in April, Proteus UTI, Candida glabrata who  completed Eraxis 6/26. Presents with persistent fevers and low hemoglobin. Patient reported that she was sent to the ED due to low hemoglobin. Patient was sent from PCP office due to hypotension, systolic blood pressure in the 60s, patient had an episode of nausea and vomiting. In the ED her hemoglobin was stable around 7.7, hyponatremia, hyperglycemia. Fecal occult was noted to be positive.  PTA she lived at home and her children were taking turns to take care of her.  Now they cannot provide any care at home.  Unable to be accepted by multiple LTAC or skilled nursing facilities.  She does want to go home, however does not have any support.  Subjective:  Reports nausea primarily after attempts to eat comfort food; denies suprapubic discomfort Discussed with patient need to adjust to easier to digest food and to pay attention to which foods may increase her discomfort since the primary reason for eating is pleasure and comfort  Objective: Vitals:   09/29/19 2105 09/30/19 0511  BP: (!) 162/76 (!) 166/85  Pulse: 76 84  Resp: 17 17  Temp: 98.1 F (36.7 C) (!) 97.5 F (36.4 C)  SpO2: 94%     Intake/Output Summary (Last 24 hours) at 09/30/2019 1237 Last data filed at 09/30/2019 0530 Gross per 24 hour  Intake 1802.28 ml  Output 2750 ml  Net -947.72 ml   Filed Weights   09/25/2019 1521 09/28/19 1509 09/28/19 1935  Weight: 45.4 kg 62.6 kg 62.6 kg    Exam: Constitutional: NAD, calm, uncomfortable 2/2 buttock discomfort  Respiratory: clear to auscultation bilaterally. Normal respiratory effort.  Air 96% sat Cardiovascular: Regular rate and rhythm, no murmurs / rubs / gallops. No extremity edema. 2+ pedal pulses.  Abdomen: no tenderness, no masses palpated.  Bowel sounds present.  TPN fusing.  Eakin's pouch over EC fistula with feculent drainage to collection bag w/ large volume returns noted Musculoskeletal: no clubbing / cyanosis. No joint deformity upper and lower extremities.  bilateral  lower extremity amputations.  Good ROM, no contractures. Normal upper body muscle tone.  Skin: no rashes, lesions, ulcers. No induration Neurologic: CN 2-12 grossly intact. Sensation intact, DTR normal. Strength 3-4/5 x all 4 extremities.  Psychiatric:  Alert and oriented x 3. Normal mood.    Assessment/Plan: Fever of unknown origin, present on admission:  History of Candida glabrata fungemia Currently no evidence of infection.-Repeat blood cultures, urinalysis and urine cultures negative. ID consulted-was restarted on Eraxis (received for 2 days) and was discontinued 6\26 CT scan abdomen pelvis chronic intra-abdominal collection, enterocutaneous fistula.  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 Surgery commenced comfort feeds Continue parenteral nutrition as well as maintenance IV fluids given large volume liquid output of feculent appearing material out of EC fistula in the Eakin's pouch-patient currently in a negative balance of nearly 10 L therefore I will start maintenance fluids as of 7/20 with NS at 100 cc/hr Due to level of patient's care which requires 24/7 attention family unable to manage patient at home family unable to manage patient at home (daughter who had previously stopped working to take care of mother had to return to work) therefore plan is to discharge to skilled nursing facility-has Medicaid  **7/19 discussed with surgery PA and no indication at this juncture to reimage since no change in drainage and no increase in abdominal discomfort.  Klebsiella UTI Foley catheter discontinued late last week Has been complaining of nausea and suprapubic discomfort Culture positive for >100,000 colonies pansensitive (resistant to ampicillin only) Klebsiella WBC is normal with only one low-grade fever 99 F Given persistent nausea and suprapubic pain started IV Rocephin on 7/19 x5 days- given recent Foley as well as chronic intra-abdominal fluid collection  secondary to enterocutaneous fistula which could be causing translocation of bacteria into the urinary tract  Anemia:  Iron deficiency with likely acute on chronic anemia as well as malnutrition. Hemoccult positive stool present on admission.  Suspect chronic GI bleeding. Hemoglobin 6.6 - 1 unit of PRBC-  8.5>7.4>7>6.6- 1 unit PRBC -8.5>7.5.  7/19 hemoglobin back down to 7.0 Continue IV Protonix, will change to p.o. Protonix on discharge. Previous provider discussed with Dr. Paulita Fujita with [facility] and they recommended symptomatic treatment. Received IV iron on 7/5.   Was given a repeat dose on 7/13 B12 continues to trend downward and was low at 402 on 6/7-repeat B12 level on 7/19 was around 7  Hypotension (resolved) Hypertension:  Etiology to hypotension 2/2 hypovolemia.     Continue metoprolol  Continue amlodipine and hydralazine.    Type 2 diabetes, diet controlled:  Currently on TPN and remains on sliding scale insulin  Insulin adjusted in TPN per pharmacy.  Sacral decubitus ulcer stage IV/left lower extremity stage II decubitus ulcer:  Present on admission.   Sacral coccyx ulcer -Reevaluated by [place] RN on 7/14.  Wound now measures 3.1 x 1 x 0.2 cm with 182% slick dark pink bed.  No longer meets requirement of stage IV decubitus.  Santyl has been discontinued in small piece of Xeroform dressing placed into the gluteal  fold wound with recommendations to cover with foam dressing and change daily. Continue air mattress.  Order placed for chair pad. Foley catheter discontinued Continue to follow left lower extremity decubitus  Chronic pain due to bilateral below knee amputation:  On gabapentin and oxycodone.  Acute kidney injury/history of urinary retention:  AKI resolved.   She had retained urine multiple times - Foley discontinued 7/14 patient voiding without difficulty Continue Flomax and bethanechol   Nutrition Status: Nutrition Problem: Moderate Malnutrition Etiology:  chronic illness (EC fistula, perforated prepyloric ulcer on TPN) Signs/Symptoms: percent weight loss, moderate fat depletion, moderate muscle depletion Percent weight loss: 9 % (in 1 mo) Interventions: Refer to RD note for recommendations **On TPN, managed by pharmacy.  IV fluids, electrolyte replacement along with TPN protocol. Needing high complexity of care , will benefit with placement at nursing home. Estimated body mass index is 25.24 kg/m as calculated from the following:   Height as of this encounter: 5\' 2"  (1.575 m).   Weight as of this encounter: 62.6 kg.    Data Reviewed: Basic Metabolic Panel: Recent Labs  Lab 09/24/19 0313 09/24/19 0313 09/25/19 0440 09/25/19 0440 09/26/19 0331 09/27/19 0350 09/28/19 0435 09/29/19 0400 09/30/19 0343  NA 136   < > 135   < > 137 135 134* 136 137  K 4.8   < > 4.7   < > 4.8 4.6 4.6 4.4 4.3  CL 101   < > 100   < > 103 104 104 105 106  CO2 29   < > 29   < > 29 25 27 26 26   GLUCOSE 145*   < > 83   < > 136* 249* 143* 278* 151*  BUN 44*   < > 51*   < > 53* 51* 53* 53* 59*  CREATININE 0.76   < > 0.79   < > 0.85 0.86 0.88 0.93 1.00  CALCIUM 7.9*   < > 7.9*   < > 8.0* 7.8* 8.1* 8.0* 8.3*  MG 2.3  --  2.3  --   --   --  2.1 2.0  --   PHOS 4.0  --  3.9  --   --   --  4.1 4.0  --    < > = values in this interval not displayed.   Liver Function Tests: Recent Labs  Lab 09/25/19 0440 09/29/19 0400  AST 20 16  ALT 15 9  ALKPHOS 96 88  BILITOT 0.2* 0.5  PROT 6.3* 6.3*  ALBUMIN 1.5* 1.5*   No results for input(s): LIPASE, AMYLASE in the last 168 hours. No results for input(s): AMMONIA in the last 168 hours. CBC: Recent Labs  Lab 09/26/19 1014 09/29/19 0400  WBC 7.0 4.8  NEUTROABS 4.4 3.5  HGB 7.3* 7.0*  HCT 23.6* 23.4*  MCV 95.2 99.6  PLT 254 233   Cardiac Enzymes: No results for input(s): CKTOTAL, CKMB, CKMBINDEX, TROPONINI in the last 168 hours. BNP (last 3 results) No results for input(s): BNP in the last 8760  hours.  ProBNP (last 3 results) No results for input(s): PROBNP in the last 8760 hours.  CBG: Recent Labs  Lab 09/28/19 2051 09/29/19 0758 09/29/19 2102 09/30/19 0002 09/30/19 0452  GLUCAP 207* 255* 193* 206* 135*    Recent Results (from the past 240 hour(s))  Culture, Urine     Status: Abnormal   Collection Time: 09/26/19  6:43 PM   Specimen: Urine, Catheterized  Result Value Ref Range Status  Specimen Description URINE, CATHETERIZED  Final   Special Requests   Final    NONE Performed at [facility], [place] [address], [place], [place] [address]    Culture >=100,000 COLONIES/mL KLEBSIELLA PNEUMONIAE (A)  Final   Report Status 09/28/2019 FINAL  Final   Organism ID, Bacteria KLEBSIELLA PNEUMONIAE (A)  Final      Susceptibility   Klebsiella pneumoniae - MIC*    AMPICILLIN >=32 RESISTANT Resistant     CEFAZOLIN <=4 SENSITIVE Sensitive     CEFTRIAXONE <=0.25 SENSITIVE Sensitive     CIPROFLOXACIN <=0.25 SENSITIVE Sensitive     GENTAMICIN <=1 SENSITIVE Sensitive     IMIPENEM <=0.25 SENSITIVE Sensitive     NITROFURANTOIN 64 INTERMEDIATE Intermediate     TRIMETH/SULFA <=20 SENSITIVE Sensitive     AMPICILLIN/SULBACTAM 4 SENSITIVE Sensitive     PIP/TAZO <=4 SENSITIVE Sensitive     * >=100,000 COLONIES/mL KLEBSIELLA PNEUMONIAE     Studies: No results found.  Scheduled Meds:  amLODipine  10 mg Oral Daily   bethanechol  5 mg Oral TID   Chlorhexidine Gluconate Cloth  6 each Topical Daily   cholestyramine  4 g Oral BID   cyanocobalamin  1,000 mcg Intramuscular Once   escitalopram  20 mg Oral Daily   feeding supplement  1 Container Oral Q24H   gabapentin  300 mg Oral BID   hydrALAZINE  25 mg Oral Q8H   insulin aspart  0-15 Units Subcutaneous 3 times per day   metoprolol tartrate  50 mg Oral BID   nystatin  5 mL Oral QID   pantoprazole  40 mg Oral BID   sodium chloride flush  10-40 mL Intracatheter Q12H   tamsulosin  0.4 mg Oral QPC supper    Continuous Infusions:  cefTRIAXone (ROCEPHIN)  IV 1 g (53/61/44 3154)   TPN CYCLIC-ADULT (ION)      Principal Problem:   Acute GI bleeding Active Problems:   Hyperglycemia (glucose 203 on admission)   HTN (hypertension)   Type 2 diabetes mellitus with other circulatory complications (HCC)   Severe protein-calorie malnutrition ([place])   Anemia of chronic disease   Schizophrenia ([place])   Sacral decubitus ulcer, stage II   Below-knee amputee ([place])   Intra-abdominal fluid collection, chronic   S/P BKA (below knee amputation) bilateral (HCC)   Enterocutaneous fistula   Fever   Iron deficiency anemia due to chronic blood loss   Hypoalbuminemia   On total parenteral nutrition (TPN)   PVD (peripheral vascular disease) (HCC) s/p bilateral BKAs   Hypotension   Hyponatremia (Sodium 131 on admission)   PUD (peptic ulcer disease) with history of perforated gastric ulcer s/p [name] patch   Chronic pain syndrome   Proteinuria   Hypomagnesemia   Hypophosphatemia   Stage 4 pressure ulcer ([place]) of bilateral coccyx   Pressure ulcer, unstageable ([place]) to right distal knee, left distal knee   Pressure ulcer of left mid thigh, stage 2 (HCC)   Pressure injury, stage 3 ([place]) to mid sacrum, left leg and right leg   Underweight   Encounter for blood transfusion on 09/13/19   Acute urinary retention   Hypoglycemia   Consultants:  Infectious disease, signed off  Gastroenterology, curbside  Procedures:  None  Antibiotics: Anti-infectives (From admission, onward)   Start     Dose/Rate Route Frequency Ordered Stop   09/29/19 1000  cefTRIAXone (ROCEPHIN) 1 g in sodium chloride 0.9 % 100 mL IVPB     Discontinue  1 g 200 mL/hr over 30 Minutes Intravenous Every 24 hours 09/29/19 0841 10/06/19 0959   09/13/19 1030  anidulafungin (ERAXIS) 100 mg in sodium chloride 0.9 % 100 mL IVPB  Status:  Discontinued        100 mg 78 mL/hr over 100 Minutes Intravenous Every 24 hours 09/12/19 0952  09/14/19 1230   09/12/19 1030  anidulafungin (ERAXIS) 200 mg in sodium chloride 0.9 % 200 mL IVPB  Status:  Discontinued        200 mg 78 mL/hr over 200 Minutes Intravenous  Once 09/12/19 2957 09/15/19 4734       Time spent: [place] ANP  [facility] Pager [phone]. If 7PM-7AM, please contact night-coverage at [email or web address] 09/30/2019, 12:37 PM  LOS: 18 days

## 2019-09-30 NOTE — Progress Notes (Signed)
Nutrition Follow-up  RD working remotely.  DOCUMENTATION CODES:   Non-severe (moderate) malnutrition in context of chronic illness  INTERVENTION:   -Decrease Boost Breeze po to daily, each supplement provides 250 kcal and 9 grams of protein -Continue PO diet for comfort -TPN management per pharmacy  NUTRITION DIAGNOSIS:   Moderate Malnutrition related to chronic illness (EC fistula, perforated prepyloric ulcer on TPN) as evidenced by percent weight loss, moderate fat depletion, moderate muscle depletion.  Ongoing  GOAL:   Patient will meet greater than or equal to 90% of their needs  Progressing   MONITOR:   PO intake, Supplement acceptance, Skin, Weight trends, Labs, I & O's  REASON FOR ASSESSMENT:   Consult Assessment of nutrition requirement/status  ASSESSMENT:   59 y.o. female with history of perforated prepyloric ulcer in 2020 s/p  exploratory laparotomy with [name] patch, chronic TPN via right arm PICC, IDDM, bilateral lower quadrant drains along with enterocutaneous fistula with colostomy bag, Chronic sacral pressure ulcers stage II, HTN, schizophrenia, bilateral BKA, PVD s/p B/L BKA presents with fever and low hemoglobin. Fecal occult blood was noted to be positive.  7/5- TPN re-started  Reviewed I/O's: -948 ml x 24 hours and -10 L since 09/16/19  UOP: 1.5 L x 24 hours  Drain output: 1.5 L x 24 hours  Attempted to speak with pt via hospital room phone, however, no answer.   Pt continues to consume a PO diet for comfort. Intake remains very poor; noted meal completion 0-10%. Per chart review, pt has been having some nausea as well as thrush. She is consuming Boost Breeze supplements per MAR. Due to [facility] fistula, it is difficult to determine how much pt is absorbing via PO route.   Pt remains on cyclic TPN (5284 mL over 14 hours - 69 mL/hr x 1 hr; then 138 mL/hr x 12 hrs; then 69 mL/hr x 1 hr). Regimen providing 1580 kcals and 90 grams protein, meeting 100% of  estimated kcal and protein needs.  Case discussed with pharmacy, who reports increase in wt since admission. Weight history difficult to interpret- unsure if this is true weight gain or fluid losses or hydration status is contributing. Adjusted nutritional needs slightly to help preserve lean body mass.   Per TOC notes, SNF placement will be challenging due to chronic TPN and eakin pouch.   Medications reviewed and include vitamin B-12  Labs reviewed: CBGS: 193-255 (inpatient orders for glycemic control are 0-15 units insulin aspart TID with meals).   Diet Order:   Diet Order            Diet heart healthy/carb modified Room service appropriate? Yes; Fluid consistency: Thin  Diet effective now                 EDUCATION NEEDS:   Not appropriate for education at this time  Skin:  Skin Assessment: Skin Integrity Issues: Skin Integrity Issues:: Stage IV Stage IV: bilateral coccyx  Last BM:  09/30/19(500 ml via EC fistula eakin pouch)  Height:   Ht Readings from Last 1 Encounters:  10/04/2019 5\' 2"  (1.575 m)    Weight:   Wt Readings from Last 1 Encounters:  09/28/19 62.6 kg    Ideal Body Weight:  43.5 kg (adjusted for bilateral BKAs)  BMI:  Body mass index is 25.24 kg/m.  Estimated Nutritional Needs:   Kcal:  1550-1750  Protein:  85-100 grams  Fluid:  > 1.6 L    Loistine Chance, RD, LDN, [place] Registered Dietitian  II Certified Diabetes Care and Education Specialist Please refer to [facility] for RD and/or RD on-call/weekend/after hours pager

## 2019-09-30 NOTE — Progress Notes (Signed)
PHARMACY - TOTAL PARENTERAL NUTRITION CONSULT NOTE  Indication:  EC fistula  Patient Measurements: Height: 5\' 2"  (157.5 cm) Weight: 62.6 kg (138 lb) IBW/kg (Calculated) : 50.1 TPN AdjBW (KG): 45.4 Body mass index is 25.24 kg/m. Usual Weight: 53-60 kg  Assessment:  63 YOF presents on 10/08/2019 with anemia, likely concurrent GIB. Patient has a history of perforated prepyloric ulcer s/p [place] Field.  She has an Chief Strategy Officer in place from January 2021 due to [facility] fistula and has been on TPN since 02/2019.  She was recently admitted with candidemia, resulting in PICC replacement.  Pharmacy consulted to continue TPN.  Patient reports not missing any TPN bags PTA. Per further discussion with MD on 7/2, held off on starting TPN initially due to fever and recent fungemia. TPN resumed 7/5. ID monitoring off antibiotics for now.  Glucose / Insulin: Hx DM controlled by diet outpatient. Required insulin gtt last admit. Now on SSI only during duration of TPN. CBGs improved 371-696 while on cyclic TPN. [name] Utilized 13 units / 24 hours. Electrolytes: Na 137. Other electrolytes wnl.  Renal: AKI resolved - SCr wnl (BL 0.6-0.8), BUN 53. UOP 1 ml/kg/hr (decrease though 1 unmeasured urine occurrence) LFTs / TGs: LFTs WNL, Tbili low, TG WNL Prealbumin / albumin: Prealbumin increased to 11.7. Albumin 1.5 Intake / Output; MIVF: 1500 mL from EC fistula, -951mL yesterday  GI Imaging: 7/2 CT abd/pelvis - similar extent, increased fullness of chronic intra-abdominal collection associated w/ known ECF, cholelithiasis Surgeries / Procedures: none since admit  Central access: PICC removed 6/12; replaced 6/16 TPN start date: chronic TPN  Nutritional Goals (Chronic TPN formula, per [facility] staff on 7/2): kCal: 1472 kCal, Protein (concentrated AA): 90g, CHO 180g, ILE 50g, no insulin Infuse 1800 mL over 14 hrs  RD goals as of 7/20: 1550-1750 kCal, 85-100g protein, > 1.6L fluid per day  Current Nutrition:  HH/CMD -  poor appetite, per RN pt has to be encouraged to eat and has not wanted breakfast yet  Boost TID between meals - Patient had 3/3 boost supplements yesterday   Plan:  Continue cyclic TPN 7893 mL over 14 hours - 69 mL/hr x 1 hr; then 138 mL/hr x 12 hrs; then 69 mL/hr x 1 hr  TPN provides 90 g AA, 216 g CHO, and 49 g ILE, for total 1580 kCal; meeting 100% of protein and 99% of kCal goals. Adjusted electrolytes in TPN: None, Monitor phos closely for adjustment  Add standard MVI and trace elements to TPN Continue no insulin in TPN. Continue moderate SSI ONLY while patient is receiving TPN. No insulin at all while she is off TPN - continue to follow poor oral intake and adjust accordingly as CBGs have been widely fluctuating due to oral consumption. Plan was discussed with MD  Monitor TPN labs Monday/Thursday Monitor discharge plan - planning SNF or LTACH   Albertina Parr, PharmD., BCPS, BCCCP Clinical Pharmacist Clinical phone for 09/30/19 until 3:30pm: [phone] If after 3:30pm, please refer to [facility] for unit-specific pharmacist

## 2019-09-30 NOTE — TOC Progression Note (Signed)
Transition of Care [facility]) - Progression Note    Patient Details  Name: Gina [name] MRN: [id number] Date of Birth: June 28, 1960  Transition of Care [facility]) CM/SW [place], [place] Phone Number: 09/30/2019, 1:38 PM  Clinical Narrative:    CSW has reached out again to [place] (Zack Rolena Infante, Denyse Amass Scinto, Olga Coaster and Ihor Gully) for assistance with placement given pt continued care needs. No offers at this time, leadership aware and reaching out to their networks.    Expected Discharge Plan: [place] Barriers to Discharge: Continued Medical Work up, Ship broker, Other (comment), Awaiting State Approval (PASRR) (TPN needed)  Expected Discharge Plan and Services Expected Discharge Plan: [place] Living arrangements for the past 2 months: Apartment  Readmission Risk Interventions Readmission Risk Prevention Plan 09/16/2019 08/29/2019 08/19/2019  Post Dischage Appt - - -  Medication Screening - - -  Transportation Screening Complete Complete Complete  PCP follow-up - - -  PCP or Specialist Appt within 3-5 Days - - -  Not Complete comments - - -  HRI or [place] Work Consult for [place] Planning/Counseling - - -  SW consult not completed comments - - -  Palliative Care Screening - - -  Palliative Care Screening Not Complete Comments - - -  Medication Review ([place]) Referral to Pharmacy Complete Complete  PCP or Specialist appointment within 3-5 days of discharge Not Complete Complete -  PCP/Specialist Appt Not Complete comments disposition pending - -  HRI or Home Care Consult Complete Complete Complete  SW Recovery Care/Counseling Consult Complete Complete Complete  Palliative Care Screening Complete Complete Not Applicable  Skilled Nursing Facility Complete Complete Not Applicable  Some recent data might be hidden

## 2019-10-01 LAB — BASIC METABOLIC PANEL
Anion gap: 5 (ref 5–15)
BUN: 55 mg/dL — ABNORMAL HIGH (ref 6–20)
CO2: 24 mmol/L (ref 22–32)
Calcium: 8.2 mg/dL — ABNORMAL LOW (ref 8.9–10.3)
Chloride: 108 mmol/L (ref 98–111)
Creatinine, Ser: 0.98 mg/dL (ref 0.44–1.00)
GFR calc Af Amer: >60 mL/min (ref >60)
GFR calc non Af Amer: >60 mL/min (ref >60)
Glucose, Bld: 262 mg/dL — ABNORMAL HIGH (ref 70–99)
Potassium: 4.2 mmol/L (ref 3.5–5.1)
Sodium: 137 mmol/L (ref 135–145)

## 2019-10-01 LAB — GLUCOSE, CAPILLARY
Glucose-Capillary: 194 mg/dL — ABNORMAL HIGH (ref 70–99)
Glucose-Capillary: 246 mg/dL — ABNORMAL HIGH (ref 70–99)
Glucose-Capillary: 253 mg/dL — ABNORMAL HIGH (ref 70–99)
Glucose-Capillary: 269 mg/dL — ABNORMAL HIGH (ref 70–99)
Glucose-Capillary: 293 mg/dL — ABNORMAL HIGH (ref 70–99)
Glucose-Capillary: 359 mg/dL — ABNORMAL HIGH (ref 70–99)

## 2019-10-01 MED ORDER — MORPHINE SULFATE (PF) 2 MG/ML IV SOLN
1.0000 mg | INTRAVENOUS | Status: DC | PRN
  Administered 2019-10-01 – 2019-10-02 (×2): 2 mg via INTRAVENOUS
  Filled 2019-10-01 (×2): qty 1

## 2019-10-01 MED ORDER — ONDANSETRON HCL 4 MG/2ML IJ SOLN
4.0000 mg | Freq: Four times a day (QID) | INTRAMUSCULAR | Status: DC
  Administered 2019-10-01 – 2019-10-02 (×7): 4 mg via INTRAVENOUS
  Filled 2019-10-01 (×7): qty 2

## 2019-10-01 MED ORDER — ACETAMINOPHEN 500 MG PO TABS
500.0000 mg | ORAL_TABLET | Freq: Four times a day (QID) | ORAL | Status: DC
  Administered 2019-10-01 – 2019-10-02 (×6): 500 mg via ORAL
  Filled 2019-10-01 (×5): qty 1

## 2019-10-01 MED ORDER — ACETAMINOPHEN 650 MG RE SUPP
650.0000 mg | Freq: Four times a day (QID) | RECTAL | Status: DC
  Filled 2019-10-01: qty 1

## 2019-10-01 MED ORDER — TRACE MINERALS CU-MN-SE-ZN 300-55-60-3000 MCG/ML IV SOLN
INTRAVENOUS | Status: AC
  Filled 2019-10-01: qty 600

## 2019-10-01 MED ORDER — FENTANYL 12 MCG/HR TD PT72: 1.0000 | MEDICATED_PATCH | TRANSDERMAL | Status: DC

## 2019-10-01 MED ORDER — INSULIN ASPART 100 UNIT/ML ~~LOC~~ SOLN
0.0000 [IU] | SUBCUTANEOUS | Status: DC
  Administered 2019-10-01: 20 [IU] via SUBCUTANEOUS
  Administered 2019-10-02 (×2): 7 [IU] via SUBCUTANEOUS
  Administered 2019-10-02: 4 [IU] via SUBCUTANEOUS
  Administered 2019-10-03: 11 [IU] via SUBCUTANEOUS

## 2019-10-01 NOTE — Progress Notes (Signed)
PHARMACY - TOTAL PARENTERAL NUTRITION CONSULT NOTE  Indication:  EC fistula  Patient Measurements: Height: 5\' 2"  (157.5 cm) Weight: 62.6 kg (138 lb) IBW/kg (Calculated) : 50.1 TPN AdjBW (KG): 45.4 Body mass index is 25.24 kg/m. Usual Weight: 53-60 kg  Assessment:  68 YOF presents on 09/17/2019 with anemia, likely concurrent GIB. Patient has a history of perforated prepyloric ulcer s/p [place].  She has an Chief Strategy Officer in place from January 2021 due to [facility] fistula and has been on TPN since 02/2019.  She was recently admitted with candidemia, resulting in PICC replacement.  Pharmacy consulted to continue TPN.  Patient reports not missing any TPN bags PTA. Per further discussion with MD on 7/2, held off on starting TPN initially due to fever and recent fungemia. TPN resumed 7/5. ID monitoring off antibiotics for now.  Glucose / Insulin: Hx DM controlled by diet outpatient. Required insulin gtt last admit. Now on SSI only during duration of TPN. CBGs improved 284-132 while on cyclic TPN. No CBG checks performed off TPN. Utilized 11 units / 24 hours. Electrolytes: Na 137. Other electrolytes wnl.  Renal: AKI resolved - SCr wnl (BL 0.6-0.8), BUN 53. UOP 1 ml/kg/hr (decrease though 1 unmeasured urine occurrence) LFTs / TGs: LFTs WNL, Tbili low, TG WNL Prealbumin / albumin: Prealbumin increased to 11.7. Albumin 1.5 Intake / Output; MIVF: 1500 mL from EC fistula, -96mL yesterday  GI Imaging: 7/2 CT abd/pelvis - similar extent, increased fullness of chronic intra-abdominal collection associated w/ known ECF, cholelithiasis Surgeries / Procedures: none since admit  Central access: PICC removed 6/12; replaced 6/16 TPN start date: chronic TPN  Nutritional Goals (Chronic TPN formula, per [facility] staff on 7/2): kCal: 1472 kCal, Protein (concentrated AA): 90g, CHO 180g, ILE 50g, no insulin Infuse 1800 mL over 14 hrs  RD goals as of 7/20: 1550-1750 kCal, 85-100g protein, > 1.6L fluid per  day  Current Nutrition:  TPN Heart healthy / carb modified - oral intake remains extremely poor  Boost supplement once daily   Plan:  Continue cyclic TPN 4401 mL over 14 hours - 69 mL/hr x 1 hr; then 138 mL/hr x 12 hrs; then 69 mL/hr x 1 hr  TPN provides 90 g AA, 216 g CHO, and 49 g ILE, for total 1580 kCal; meeting 100% of protein and 99% of kCal goals. Adjusted electrolytes in TPN: None, Monitor phos closely for adjustment  Add standard MVI and trace elements to TPN Continue no insulin in TPN. Increase to resistant SSI ONLY while patient is receiving TPN. No insulin at all while she is off TPN - continue to follow poor oral intake and adjust accordingly as CBGs have been widely fluctuating due to oral consumption. Plan was discussed with MD. Discussed with RN regarding checking CBGs while off TPN  Monitor TPN labs Monday/Thursday Monitor discharge plan - planning SNF or LTACH   Albertina Parr, PharmD., BCPS, BCCCP Clinical Pharmacist Clinical phone for 10/01/19 until 3:30pm: [phone] If after 3:30pm, please refer to [facility] for unit-specific pharmacist

## 2019-10-01 NOTE — Progress Notes (Signed)
PT Cancellation Note  Patient Details Name: Amyiah Gaba MRN: [id number] DOB: [birth year]   Cancelled Treatment:    Reason Eval/Treat Not Completed: Other (comment) Pt declining therapy session due to continued nausea/vomiting.    Wyona Almas, PT, DPT Acute Rehabilitation Services Pager [phone] Office [phone]    Deno Etienne 10/01/2019, 2:30 PM

## 2019-10-01 NOTE — Progress Notes (Signed)
[facility] PROGRESS NOTE  Gina [name] TIW:[id number] DOB: Apr 30, 1960 DOA: 58/22/2021 PCP: [name], MD  Status: Inpatient---Remains inpatient appropriate because:Ongoing diagnostic testing needed not appropriate for outpatient work up, Unsafe d/c plan and IV treatments appropriate due to intensity of illness or inability to take PO-high level of care that family is unable to provide   Dispo: The patient is from: Home              Anticipated d/c is to: SNF              Anticipated d/c date is: > 3 days              Patient currently is medically stable to d/c.  On [name] barrier to discharge is SNF that is capable of managing chronic TPN as well as Eakin's pouch over EC fistula. Previously patient's daughter quit her job to care for her mother 5 days a week. Care was provided both by daughter and son. Daughter also has children she must take care of. Unfortunately it was not financially feasible for the daughter to remain out of work so she has had to return to work and therefore patient did not have appropriate level of 24/7 care available at discharge.  Code Status: Partial: DNR except okay to use NIMV/BiPAP Family Communication: Patient only DVT prophylaxis: Low risk secondary to chronicity of current issues and bilateral lower extremity amputations therefore no indication for pharmacological DVT prophylaxis or SCDs  HPI: 59 year old with past medical history significant for perforated prepyloric ulcer in 2020 status post exploratory laparotomy with [name] patch, chronic TPN via right arm PICC line, insulin-dependent diabetes, history of bilateral lower quadrant drains along with enterocutaneous fistula with colostomy bag, chronic sacral pressure ulcer stage II, schizophrenia, bilateral BKA who presents for evaluation of fever. She has been admitted a few times this year for sepsis including MRSA bacteremia, fungemia, Candida peritonitis in April, Proteus UTI, Candida glabrata who  completed Eraxis 6/26. Presents with persistent fevers and low hemoglobin. Patient reported that she was sent to the ED due to low hemoglobin. Patient was sent from PCP office due to hypotension, systolic blood pressure in the 60s, patient had an episode of nausea and vomiting. In the ED her hemoglobin was stable around 7.7, hyponatremia, hyperglycemia. Fecal occult was noted to be positive.  PTA she lived at home and her children were taking turns to take care of her.  Now they cannot provide any care at home.  Unable to be accepted by multiple LTAC or skilled nursing facilities.  She does want to go home, however does not have any support.  Subjective:  Continues to report constant nausea.  States seems to be worse with administration of narcotic pain medications.  States in regards to oral intake that much less nausea with protein shakes as opposed to solid food.  Objective: Vitals:   10/01/19 0445 10/01/19 0453  BP: (!) 173/156 (!) 157/80  Pulse: 84   Resp: 20   Temp: (!) 97.5 F (36.4 C)   SpO2: (!) 89% 91%    Intake/Output Summary (Last 24 hours) at 10/01/2019 1303 Last data filed at 10/01/2019 1052 Gross per 24 hour  Intake 3973.87 ml  Output 1800 ml  Net 2173.87 ml   Filed Weights   09/22/2019 1521 09/28/19 1509 09/28/19 1935  Weight: 45.4 kg 62.6 kg 62.6 kg    Exam: Constitutional: NAD, calm, uncomfortable 2/2 buttock discomfort  Respiratory: clear to auscultation bilaterally. Normal respiratory effort.  Room air- 96% sat Cardiovascular: Regular rate and rhythm, no murmurs / rubs / gallops. No extremity edema. 2+ pedal pulses. NS  Abdomen: no tenderness, no masses palpated.  Bowel sounds present.  TPN fusing.  Eakin's pouch over EC fistula with feculent drainage to collection bag w/ large volume returns noted Musculoskeletal: no clubbing / cyanosis. No joint deformity upper and lower extremities.  bilateral lower extremity amputations.  Good ROM, no contractures. Normal  upper body muscle tone.  Skin: no rashes, lesions, ulcers. No induration Neurologic: CN 2-12 grossly intact. Sensation intact, DTR normal. Strength 3-4/5 x all 4 extremities.  Psychiatric:  Alert and oriented x 3. Normal mood.    Assessment/Plan: Fever of unknown origin, present on admission:  History of Candida glabrata fungemia Currently no evidence of infection.-Repeat blood cultures, urinalysis and urine cultures negative. ID consulted-was restarted on Eraxis (received for 2 days) and was discontinued 6\26 CT scan abdomen pelvis chronic intra-abdominal collection, enterocutaneous fistula.  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 Surgery OK w/ comfort feeds Continue parenteral nutrition as well as maintenance IV fluids given large volume liquid output of feculent appearing material out of EC fistula in the Eakin's pouch Currently in a negative balance of nearly 10 L therefore maintenance fluids  NS at 100 cc/hr ordered 7/20 Due to level of patient's care which requires 24/7 attention family unable to manage patient at home family unable to manage patient at home (daughter who had previously stopped working to take care of mother had to return to work) therefore plan is to discharge to skilled nursing facility-has Medicaid  **7/19 discussed with surgery PA and no indication at this juncture to reimage since no change in drainage and no increase in abdominal discomfort.  Klebsiella UTI Foley catheter discontinued late last week Has been complaining of nausea and suprapubic discomfort Culture positive for >100,000 colonies pansensitive (resistant to ampicillin only) Klebsiella WBC is normal with only one low-grade fever 99 F Given persistent nausea and suprapubic pain started IV Rocephin on 7/19 x5 days- given recent Foley as well as chronic intra-abdominal fluid collection secondary to enterocutaneous fistula which could be causing translocation of bacteria into the  urinary tract  Persistent nausea Multifactorial in etiology Patient attempting to adjust diet to minimize nausea Zofran changed to scheduled Oral narcotics discontinued in favor of IV narcotic Scheduled oral Tylenol with PR reviewed available if needed to assist with pain management  Anemia:  Iron deficiency with likely acute on chronic anemia as well as malnutrition. Hemoccult positive stool present on admission.  Suspect chronic GI bleeding. Hemoglobin 6.6 - 1 unit of PRBC-  8.5>7.4>7>6.6- 1 unit PRBC -8.5>7.5.  7/19 hemoglobin back down to 7.0 Continue IV Protonix, will change to p.o. Protonix on discharge. Previous provider discussed with Dr. Paulita Fujita with [facility] and they recommended symptomatic treatment. Received IV iron on 7/5.   Was given a repeat dose on 7/13 B12 continues to trend downward and was low at 402 on 6/7-repeat B12 level on 7/19 was around 7  Hypotension (resolved) Hypertension:  Etiology to hypotension 2/2 hypovolemia.     Continue metoprolol  Continue amlodipine and hydralazine.    Type 2 diabetes, diet controlled:  Currently on TPN and remains on sliding scale insulin  Insulin adjusted in TPN per pharmacy.  Sacral decubitus ulcer stage IV/left lower extremity stage II decubitus ulcer:  Present on admission.   Sacral coccyx ulcer -Reevaluated by [place] RN on 7/14.  Wound now measures 3.1 x 1 x 0.2 cm  with 335% slick dark pink bed.  No longer meets requirement of stage IV decubitus.  Santyl has been discontinued in small piece of Xeroform dressing placed into the gluteal fold wound with recommendations to cover with foam dressing and change daily. Continue air mattress.  Order placed for chair pad. Foley catheter discontinued Continue to follow left lower extremity decubitus  Chronic pain due to bilateral below knee amputation:  On gabapentin and oxycodone.  Acute kidney injury/history of urinary retention:  AKI resolved.   She had retained urine  multiple times - Foley discontinued 7/14 patient voiding without difficulty Continue Flomax and bethanechol   Nutrition Status: Nutrition Problem: Moderate Malnutrition Etiology: chronic illness (EC fistula, perforated prepyloric ulcer on TPN) Signs/Symptoms: percent weight loss, moderate fat depletion, moderate muscle depletion Percent weight loss: 9 % (in 1 mo) Interventions: Refer to RD note for recommendations **On TPN, managed by pharmacy.  IV fluids, electrolyte replacement along with TPN protocol. Needing high complexity of care , will benefit with placement at nursing home. Estimated body mass index is 25.24 kg/m as calculated from the following:   Height as of this encounter: 5\' 2"  (1.575 m).   Weight as of this encounter: 62.6 kg.    Data Reviewed: Basic Metabolic Panel: Recent Labs  Lab 09/25/19 0440 09/26/19 0331 09/27/19 0350 09/28/19 0435 09/29/19 0400 09/30/19 0343 10/01/19 0412  NA 135   < > 135 134* 136 137 137  K 4.7   < > 4.6 4.6 4.4 4.3 4.2  CL 100   < > 104 104 105 106 108  CO2 29   < > 25 27 26 26 24   GLUCOSE 83   < > 249* 143* 278* 151* 262*  BUN 51*   < > 51* 53* 53* 59* 55*  CREATININE 0.79   < > 0.86 0.88 0.93 1.00 0.98  CALCIUM 7.9*   < > 7.8* 8.1* 8.0* 8.3* 8.2*  MG 2.3  --   --  2.1 2.0  --   --   PHOS 3.9  --   --  4.1 4.0  --   --    < > = values in this interval not displayed.   Liver Function Tests: Recent Labs  Lab 09/25/19 0440 09/29/19 0400  AST 20 16  ALT 15 9  ALKPHOS 96 88  BILITOT 0.2* 0.5  PROT 6.3* 6.3*  ALBUMIN 1.5* 1.5*   No results for input(s): LIPASE, AMYLASE in the last 168 hours. No results for input(s): AMMONIA in the last 168 hours. CBC: Recent Labs  Lab 09/26/19 1014 09/29/19 0400  WBC 7.0 4.8  NEUTROABS 4.4 3.5  HGB 7.3* 7.0*  HCT 23.6* 23.4*  MCV 95.2 99.6  PLT 254 233   Cardiac Enzymes: No results for input(s): CKTOTAL, CKMB, CKMBINDEX, TROPONINI in the last 168 hours. BNP (last 3  results) No results for input(s): BNP in the last 8760 hours.  ProBNP (last 3 results) No results for input(s): PROBNP in the last 8760 hours.  CBG: Recent Labs  Lab 09/30/19 0452 09/30/19 1945 10/01/19 0006 10/01/19 0451 10/01/19 0942  GLUCAP 135* 155* 194* 253* 293*    Recent Results (from the past 240 hour(s))  Culture, Urine     Status: Abnormal   Collection Time: 09/26/19  6:43 PM   Specimen: Urine, Catheterized  Result Value Ref Range Status   Specimen Description URINE, CATHETERIZED  Final   Special Requests   Final    NONE Performed at [facility]  Lab, [address], [place], [place] [address]    Culture >=100,000 COLONIES/mL KLEBSIELLA PNEUMONIAE (A)  Final   Report Status 09/28/2019 FINAL  Final   Organism ID, Bacteria KLEBSIELLA PNEUMONIAE (A)  Final      Susceptibility   Klebsiella pneumoniae - MIC*    AMPICILLIN >=32 RESISTANT Resistant     CEFAZOLIN <=4 SENSITIVE Sensitive     CEFTRIAXONE <=0.25 SENSITIVE Sensitive     CIPROFLOXACIN <=0.25 SENSITIVE Sensitive     GENTAMICIN <=1 SENSITIVE Sensitive     IMIPENEM <=0.25 SENSITIVE Sensitive     NITROFURANTOIN 64 INTERMEDIATE Intermediate     TRIMETH/SULFA <=20 SENSITIVE Sensitive     AMPICILLIN/SULBACTAM 4 SENSITIVE Sensitive     PIP/TAZO <=4 SENSITIVE Sensitive     * >=100,000 COLONIES/mL KLEBSIELLA PNEUMONIAE     Studies: No results found.  Scheduled Meds: . acetaminophen  500 mg Oral Q6H   Or  . acetaminophen  650 mg Rectal Q6H  . amLODipine  10 mg Oral Daily  . bethanechol  5 mg Oral TID  . Chlorhexidine Gluconate Cloth  6 each Topical Daily  . cholestyramine  4 g Oral BID  . cyanocobalamin  1,000 mcg Intramuscular Once  . escitalopram  20 mg Oral Daily  . feeding supplement  1 Container Oral Q24H  . gabapentin  300 mg Oral BID  . hydrALAZINE  25 mg Oral Q8H  . insulin aspart  0-15 Units Subcutaneous 3 times per day  . metoprolol tartrate  50 mg Oral BID  . nystatin  5 mL Oral QID  .  ondansetron (ZOFRAN) IV  4 mg Intravenous Q6H  . pantoprazole  40 mg Oral BID  . sodium chloride flush  10-40 mL Intracatheter Q12H  . tamsulosin  0.4 mg Oral QPC supper   Continuous Infusions: . sodium chloride 100 mL/hr at 10/01/19 0938  . cefTRIAXone (ROCEPHIN)  IV 1 g (10/01/19 0939)  . TPN CYCLIC-ADULT (ION)      Principal Problem:   Acute GI bleeding Active Problems:   Hyperglycemia (glucose 203 on admission)   HTN (hypertension)   Type 2 diabetes mellitus with other circulatory complications (HCC)   Severe protein-calorie malnutrition (HCC)   Anemia of chronic disease   Schizophrenia (HCC)   Sacral decubitus ulcer, stage II   Below-knee amputee (HCC)   Intra-abdominal fluid collection, chronic   S/P BKA (below knee amputation) bilateral (HCC)   Enterocutaneous fistula   Fever   Iron deficiency anemia due to chronic blood loss   Hypoalbuminemia   On total parenteral nutrition (TPN)   PVD (peripheral vascular disease) (HCC) s/p bilateral BKAs   Hypotension   Hyponatremia (Sodium 131 on admission)   PUD (peptic ulcer disease) with history of perforated gastric ulcer s/p [name] patch   Chronic pain syndrome   Proteinuria   Hypomagnesemia   Hypophosphatemia   Stage 4 pressure ulcer (HCC) of bilateral coccyx   Pressure ulcer, unstageable ([place]) to right distal knee, left distal knee   Pressure ulcer of left mid thigh, stage 2 (HCC)   Pressure injury, stage 3 ([place]) to mid sacrum, left leg and right leg   Underweight   Encounter for blood transfusion on 09/13/19   Acute urinary retention   Hypoglycemia   Consultants:  Infectious disease, signed off  Gastroenterology, curbside  Procedures:  None  Antibiotics: Anti-infectives (From admission, onward)   Start     Dose/Rate Route Frequency Ordered Stop   09/29/19 1000  cefTRIAXone (ROCEPHIN) 1 g  in sodium chloride 0.9 % 100 mL IVPB     Discontinue     1 g 200 mL/hr over 30 Minutes Intravenous Every 24 hours  09/29/19 0841 10/06/19 0959   09/13/19 1030  anidulafungin (ERAXIS) 100 mg in sodium chloride 0.9 % 100 mL IVPB  Status:  Discontinued        100 mg 78 mL/hr over 100 Minutes Intravenous Every 24 hours 09/12/19 0952 09/14/19 1230   09/12/19 1030  anidulafungin (ERAXIS) 200 mg in sodium chloride 0.9 % 200 mL IVPB  Status:  Discontinued        200 mg 78 mL/hr over 200 Minutes Intravenous  Once 09/12/19 7034 09/15/19 0352       Time spent: [place] ANP  [facility] Pager [phone]. If 7PM-7AM, please contact night-coverage at [email or web address] 10/01/2019, 1:03 PM  LOS: 19 days

## 2019-10-01 NOTE — Progress Notes (Signed)
Inpatient Diabetes Program Recommendations  AACE/ADA: New Consensus Statement on Inpatient Glycemic Control (2015)  Target Ranges:  Prepandial:   less than 140 mg/dL      Peak postprandial:   less than 180 mg/dL (1-2 hours)      Critically ill patients:  140 - 180 mg/dL   Lab Results  Component Value Date   GLUCAP 293 (H) 10/01/2019   HGBA1C 7.0 (H) 08/18/2019    Review of Glycemic Control Results for Gina [name], Gina [name] (MRN [id number]) as of 10/01/2019 10:01  Ref. Range 09/29/2019 07:58 09/29/2019 21:02 09/30/2019 00:02 09/30/2019 04:52 09/30/2019 19:45 10/01/2019 00:06 10/01/2019 04:51 10/01/2019 09:42  Glucose-Capillary Latest Ref Range: 70 - 99 mg/dL 255 (H) 193 (H) 206 (H) 135 (H) 155 (H) 194 (H) 253 (H) 293 (H)    Diabetes history: DM  Outpatient Diabetes medications: has been on basal bolus in the past, insulin needs decreased dramatically after perforated ulcer Current orders for Inpatient glycemic control: Novolog 0-15 units tid  TPN ordered without insulin added  Inpatient Diabetes Program Recommendations:    - CBG checks Q4 to also assess trends during the day.  - consider decreasing Novolog Correction to 0-9 units Q4 hours to also cover glucose during the day.  Thanks,  Tama Headings RN, MSN, BC-ADM Inpatient Diabetes Coordinator Team Pager [phone] (8a-5p)

## 2019-10-02 LAB — COMPREHENSIVE METABOLIC PANEL
ALT: 11 U/L (ref 0–44)
AST: 14 U/L — ABNORMAL LOW (ref 15–41)
Albumin: 1.8 g/dL — ABNORMAL LOW (ref 3.5–5.0)
Alkaline Phosphatase: 85 U/L (ref 38–126)
Anion gap: 6 (ref 5–15)
BUN: 54 mg/dL — ABNORMAL HIGH (ref 6–20)
CO2: 24 mmol/L (ref 22–32)
Calcium: 8.5 mg/dL — ABNORMAL LOW (ref 8.9–10.3)
Chloride: 110 mmol/L (ref 98–111)
Creatinine, Ser: 0.85 mg/dL (ref 0.44–1.00)
GFR calc Af Amer: >60 mL/min (ref >60)
GFR calc non Af Amer: >60 mL/min (ref >60)
Glucose, Bld: 187 mg/dL — ABNORMAL HIGH (ref 70–99)
Potassium: 3.8 mmol/L (ref 3.5–5.1)
Sodium: 140 mmol/L (ref 135–145)
Total Bilirubin: 0.5 mg/dL (ref 0.3–1.2)
Total Protein: 7 g/dL (ref 6.5–8.1)

## 2019-10-02 LAB — GLUCOSE, CAPILLARY
Glucose-Capillary: 112 mg/dL — ABNORMAL HIGH (ref 70–99)
Glucose-Capillary: 139 mg/dL — ABNORMAL HIGH (ref 70–99)
Glucose-Capillary: 174 mg/dL — ABNORMAL HIGH (ref 70–99)
Glucose-Capillary: 210 mg/dL — ABNORMAL HIGH (ref 70–99)
Glucose-Capillary: 242 mg/dL — ABNORMAL HIGH (ref 70–99)
Glucose-Capillary: 85 mg/dL (ref 70–99)

## 2019-10-02 LAB — MAGNESIUM: Magnesium: 1.8 mg/dL (ref 1.7–2.4)

## 2019-10-02 LAB — PHOSPHORUS: Phosphorus: 3.5 mg/dL (ref 2.5–4.6)

## 2019-10-02 MED ORDER — TRACE MINERALS CU-MN-SE-ZN 300-55-60-3000 MCG/ML IV SOLN
INTRAVENOUS | Status: DC
  Filled 2019-10-02: qty 600

## 2019-10-02 NOTE — Progress Notes (Signed)
PROGRESS NOTE  Gina [name]  DOB: 03-Nov-1960  PCP: [name], MD VOJ:[id number]  DOA: 09/15/2019  LOS: 20 days   Chief Complaint  Patient presents with  . low hbg   Brief narrative: 59 year old with past medical history significant for perforated prepyloric ulcer in 2020 status post exploratory laparotomy with [name] patch. She has an Chief Strategy Officer in place from January 2021 due to [facility] fistula and has been on TPN since 02/2019. She was recently admitted with candidemia, resulting in PICC replacement.   She also has history of insulin-dependent diabetes, chronic sacral pressure ulcer stage II, schizophrenia, bilateral BKA. She had been admitted a few times this year for sepsis including MRSA bacteremia, fungemia, Candida peritonitis in April, Proteus UTI, Candida glabrata for which she completed Eraxis on 6/26.   This time, patient presented to ED on 7/1 for evaluation of fever, hypotension and low hemoglobin.  In the ED her hemoglobin was stable around 7.7, hyponatremia, hyperglycemia. Fecal occult was noted to be positive. See below for details of hospital course.  Subjective: Patient was seen and examined this morning. Lethargic.  Lying down in bed.  Opens eyes on verbal command.  Not bothered by expression. Bilateral amputation status.  Has a EC fistula in the abdomen with fecal output in the bag.   On nightly TPN treatment. BMP from this morning sodium 140, potassium 3.8, BUN/creatinine 54/0.85, 91.9 phosphorus 3.5, albumin remains low at 1.8.  Assessment/Plan: Fever of unknown origin - POA Chronic EC fistula -Repeat blood cultures, urinalysis and urine cultures negative. -CT scan abdomen pelvis chronic intra-abdominal collection, enterocutaneous fistula.  -Infectious disease consulted, patient was restarted on Eraxis that she received for 2 days and that was discontinued.  -No evidence of recurrent fever or localization of infection.   -Uncontrolled nausea likely because of  intra-abdominal process.  Currently on scheduled Zofran.  Klebsiella in urine -7/16 -patient apparently had an urine culture sent on 7/16 which is showing more than 100,000 CFU per mL of Klebsiella pneumoniae. -Unexplained nausea, lethargy, she was started on IV Rocephin for 5-day course.  Acute on chronic anemia  Iron deficiency anemia  -Hemoccult positive stool POA -suspect chronic GI bleeding. -Hemoglobin 6.6 on admission.  Patient received 2 units of PRBC so far.  Last hemoglobin on 7/18 was 7.  Repeat hemoglobin tomorrow.  -Continue Protonix oral.   -Previous provider discussed with Dr. Paulita Fujita with [facility] and was recommended symptomatic treatment. -Received IV iron on 7/5 and 7/17.  History of Candida glabrata fungemia:  -Completed Eraxis on 6/26.  Hypertension -Blood pressure controlled on metoprolol: Amlodipine and hydralazine.  Type 2 diabetes, diet controlled: A1c 7 on 6/7. Hypoglycemia -Currently on TPN and on sliding scale insulin.  -With poor oral appetite, intermittent TPN and episodes of hypoglycemia, I would switch patient's sliding scale to very sensitive regimen.  Continue to monitor.  Sacral decubitus ulcer stage IV/left lower extremity stage II decubitus ulcer: Present on admission.  Local wound care.    Chronic pain due to bilateral below knee amputation: On gabapentin and oxycodone.  Acute kidney injury: AKI resolved.  Foley removed.    Voiding well.  Poor quality of life/poor prognosis -Palliative care consultation was obtained.  Consultation note from 7/15 reviewed. -Currently patient is limited CODE STATUS.  May need to revisit palliative care.  Mobility: Limited because of bilateral BKA status Code Status:   Code Status: Partial Code  Nutritional status: Body mass index is 25.24 kg/m. Nutrition Problem: Moderate Malnutrition Etiology: chronic  illness (EC fistula, perforated prepyloric ulcer on TPN) Signs/Symptoms: percent weight loss,  moderate fat depletion, moderate muscle depletion Percent weight loss: 9 % (in 1 mo) Diet Order            Diet heart healthy/carb modified Room service appropriate? Yes; Fluid consistency: Thin  Diet effective now                On TPN nightly, managed by pharmacy.  IV fluids, electrolyte replacement along with TPN protocol. Needing high complexity of care , will benefit with placement at nursing home.  DVT prophylaxis: Unable to be on Lovenox/heparin because of acute on chronic GI bleeding.  Unable to have SCDs because of bilateral BKA status   Antimicrobials:  None Fluid: None Consultants:  Infectious disease, signed off Gastroenterology, curbside  Family Communication:  None at bedside  Status is: Inpatient  Remains inpatient appropriate because:Unsafe d/c plan   Dispo: The patient is from: Home              Anticipated d/c is to: SNF, LTAC, select              Anticipated d/c date is: 3 days       Patient was living at her home and her children were taking turns to take care of her.  They now say that they cannot provide any care at home.  Patient currently is not medically stable to d/c to home.  Recommend LTAC or skilled nursing facility. Waiting to go to some facilities that can manage the Eakin's patch and TPN.  Infusions:  . sodium chloride 100 mL/hr at 10/02/19 0656  . cefTRIAXone (ROCEPHIN)  IV 1 g (10/02/19 1057)  . TPN CYCLIC-ADULT (ION)      Scheduled Meds: . acetaminophen  500 mg Oral Q6H   Or  . acetaminophen  650 mg Rectal Q6H  . amLODipine  10 mg Oral Daily  . bethanechol  5 mg Oral TID  . Chlorhexidine Gluconate Cloth  6 each Topical Daily  . cholestyramine  4 g Oral BID  . cyanocobalamin  1,000 mcg Intramuscular Once  . escitalopram  20 mg Oral Daily  . feeding supplement  1 Container Oral Q24H  . gabapentin  300 mg Oral BID  . hydrALAZINE  25 mg Oral Q8H  . insulin aspart  0-20 Units Subcutaneous 3 times per day  . metoprolol tartrate  50  mg Oral BID  . nystatin  5 mL Oral QID  . ondansetron (ZOFRAN) IV  4 mg Intravenous Q6H  . pantoprazole  40 mg Oral BID  . sodium chloride flush  10-40 mL Intracatheter Q12H  . tamsulosin  0.4 mg Oral QPC supper    Antimicrobials: Anti-infectives (From admission, onward)   Start     Dose/Rate Route Frequency Ordered Stop   09/29/19 1000  cefTRIAXone (ROCEPHIN) 1 g in sodium chloride 0.9 % 100 mL IVPB     Discontinue     1 g 200 mL/hr over 30 Minutes Intravenous Every 24 hours 09/29/19 0841 10/06/19 0959   09/13/19 1030  anidulafungin (ERAXIS) 100 mg in sodium chloride 0.9 % 100 mL IVPB  Status:  Discontinued        100 mg 78 mL/hr over 100 Minutes Intravenous Every 24 hours 09/12/19 0952 09/14/19 1230   09/12/19 1030  anidulafungin (ERAXIS) 200 mg in sodium chloride 0.9 % 200 mL IVPB  Status:  Discontinued        200 mg 78 mL/hr over  200 Minutes Intravenous  Once 09/12/19 0952 09/15/19 0903      PRN meds: morphine injection, prochlorperazine, sodium chloride flush   Objective: Vitals:   10/01/19 2051 10/02/19 0406  BP: (!) 169/86 (!) 152/73  Pulse: 75 71  Resp: 18 18  Temp:  (!) 97.3 F (36.3 C)  SpO2: 93% 98%    Intake/Output Summary (Last 24 hours) at 10/02/2019 1139 Last data filed at 10/02/2019 1035 Gross per 24 hour  Intake 4148.75 ml  Output 1920 ml  Net 2228.75 ml   Filed Weights   10/06/2019 1521 09/28/19 1509 09/28/19 1935  Weight: 45.4 kg 62.6 kg 62.6 kg   Weight change:  Body mass index is 25.24 kg/m.   Physical Exam: General exam: Weak, lethargic  skin: No rashes, lesions or ulcers. HEENT: Atraumatic, normocephalic, supple neck, no obvious bleeding Lungs: Clear to auscultation bilaterally CVS: Regular rate and rhythm, no murmur GI/Abd soft, nondistended, nontender, EC fistula with feculent drainage CNS: Sleeping, opens eyes on verbal command. Psychiatry: Depressed look Extremities: Bilateral BKA status  Data Review: I have personally reviewed the  laboratory data and studies available.  Recent Labs  Lab 09/26/19 1014 09/29/19 0400  WBC 7.0 4.8  NEUTROABS 4.4 3.5  HGB 7.3* 7.0*  HCT 23.6* 23.4*  MCV 95.2 99.6  PLT 254 233   Recent Labs  Lab 09/28/19 0435 09/29/19 0400 09/30/19 0343 10/01/19 0412 10/02/19 0500  NA 134* 136 137 137 140  K 4.6 4.4 4.3 4.2 3.8  CL 104 105 106 108 110  CO2 27 26 26 24 24   GLUCOSE 143* 278* 151* 262* 187*  BUN 53* 53* 59* 55* 54*  CREATININE 0.88 0.93 1.00 0.98 0.85  CALCIUM 8.1* 8.0* 8.3* 8.2* 8.5*  MG 2.1 2.0  --   --  1.8  PHOS 4.1 4.0  --   --  3.5    Signed, Terrilee Croak, MD [facility] Pager: [phone] (Secure Chat preferred). 10/02/2019

## 2019-10-02 NOTE — Progress Notes (Signed)
PHARMACY - TOTAL PARENTERAL NUTRITION CONSULT NOTE  Indication:  EC fistula  Patient Measurements: Height: 5\' 2"  (157.5 cm) Weight: 62.6 kg (138 lb) IBW/kg (Calculated) : 50.1 TPN AdjBW (KG): 45.4 Body mass index is 25.24 kg/m. Usual Weight: 53-60 kg  Assessment:  58 YOF presents on 10/07/2019 with anemia, likely concurrent GIB. Patient has a history of perforated prepyloric ulcer s/p [place].  She has an Chief Strategy Officer in place from January 2021 due to [facility] fistula and has been on TPN since 02/2019.  She was recently admitted with candidemia, resulting in PICC replacement.  Pharmacy consulted to continue TPN.  Patient reports not missing any TPN bags PTA. Per further discussion with MD on 7/2, held off on starting TPN initially due to fever and recent fungemia. TPN resumed 7/5. ID monitoring off antibiotics for now.  Glucose / Insulin: Hx DM controlled by diet outpatient. Required insulin gtt last admit. Now on resistant SSI only during duration of TPN. CBGs improved 505-397 while on cyclic TPN. CBGs 256-269 off TPN. Utilized 31 units / 24 hours. Electrolytes: All electrolytes wnl.  Renal: AKI resolved - SCr wnl (BL 0.6-0.8), BUN 54. UOP 0.6 ml/kg/hr  LFTs / TGs: LFTs WNL, Tbili low, TG WNL Prealbumin / albumin: Prealbumin increased to 11.7. Albumin 1.5 Intake / Output; MIVF: On NS @ 119mL/hr, 1000 mL from EC fistula, +2.7 L yesterday  GI Imaging: 7/2 CT abd/pelvis - similar extent, increased fullness of chronic intra-abdominal collection associated w/ known ECF, cholelithiasis Surgeries / Procedures: none since admit  Central access: PICC removed 6/12; replaced 6/16 TPN start date: chronic TPN  Nutritional Goals (Chronic TPN formula, per [facility] staff on 7/2): kCal: 1472 kCal, Protein (concentrated AA): 90g, CHO 180g, ILE 50g, no insulin Infuse 1800 mL over 14 hrs  RD goals as of 7/20: 1550-1750 kCal, 85-100g protein, > 1.6L fluid per day  Current Nutrition:  TPN Heart  healthy / carb modified - oral intake remains extremely poor  Boost supplement once daily   Plan:  Continue cyclic TPN 6734 mL over 14 hours - 69 mL/hr x 1 hr; then 138 mL/hr x 12 hrs; then 69 mL/hr x 1 hr  TPN provides 90 g AA, 216 g CHO, and 49 g ILE, for total 1580 kCal; meeting 100% of protein and 99% of kCal goals. Adjusted electrolytes in TPN: None, Monitor phos closely for adjustment  Add standard MVI and trace elements to TPN Continue no insulin in TPN. Increased to resistant SSI ONLY while patient is receiving TPN. No insulin at all while she is off TPN - continue to follow poor oral intake and adjust accordingly as CBGs have been widely fluctuating due to oral consumption. Plan was discussed with MD. Discussed with RN regarding checking CBGs while off TPN  Monitor TPN labs Monday/Thursday Monitor discharge plan - planning SNF or LTACH   Albertina Parr, PharmD., BCPS, BCCCP Clinical Pharmacist Clinical phone for 10/02/19 until 3:30pm: [phone] If after 3:30pm, please refer to [facility] for unit-specific pharmacist

## 2019-10-02 NOTE — Progress Notes (Signed)
Occupational Therapy Treatment and Discharge Patient Details Name: Gina [name] MRN: [id number] DOB: [birth year] Today's Date: 10/02/2019    History of present illness Pt is a 59 year old with past medical history significant for perforated prepyloric ulcer in 2020 status post exploratory laparotomy with [name] patch, chronic TPN via right arm PICC line, insulin-dependent diabetes, history off bilateral lower quadrant drains along with enterocutaneous fistula with colostomy bag, chronic sacral pressure ulcer stage II, schizophrenia, bilateral BKA who presents for evaluation of fever of unclear source. Also with anemia thought to be due to chronic GI bleed.    OT comments  Pt has not made any appreciable functional gains toward her OT goals since admission. Discharging OT.   Follow Up Recommendations  SNF;Supervision/Assistance - 24 hour    Equipment Recommendations  None recommended by OT    Recommendations for Other Services      Precautions / Restrictions Precautions Precautions: Fall Precaution Comments: eakin pouch, bilateral BKA       Mobility Bed Mobility Overal bed mobility: Needs Assistance Bed Mobility: Rolling;Supine to Sit Rolling: +2 for physical assistance;Max assist   Supine to sit: +2 for safety/equipment;Max assist     General bed mobility comments: assist to pull trunk forward and pivot with bed pad in preparation for A-P transfer to chair, rolled with chair in reclined position to change pad after pouch leak   Transfers Overall transfer level: Needs assistance   Transfers: Anterior-Posterior Transfer       Anterior-Posterior transfers: Total assist;+2 physical assistance   General transfer comment: bed>w/c>recliner using bed pad     Balance Overall balance assessment: Needs assistance Sitting-balance support: Bilateral upper extremity supported Sitting balance-Leahy Scale: Fair Sitting balance - Comments: requires min guard assist and B UE support  to long sit in bed  Postural control: Posterior lean                                 ADL either performed or assessed with clinical judgement   ADL Overall ADL's : Needs assistance/impaired       Grooming Details (indicate cue type and reason): total assist to don wig                     Toileting- Clothing Manipulation and Hygiene: Total assistance;Bed level       Functional mobility during ADLs: Total assistance;Financial planner      Cognition Arousal/Alertness: Awake/alert Behavior During Therapy: WFL for tasks assessed/performed Overall Cognitive Status: History of cognitive impairments - at baseline                                          Exercises     Shoulder Instructions       General Comments      Pertinent Vitals/ Pain       Pain Assessment: Faces Faces Pain Scale: Hurts even more Pain Location: residual limbs and back Pain Descriptors / Indicators: Aching;Grimacing Pain Intervention(s): Monitored during session;Repositioned  Home Living  Prior Functioning/Environment              Frequency           Progress Toward Goals  OT Goals(current goals can now be found in the care plan section)  Progress towards OT goals: Not progressing toward goals - comment  Acute Rehab OT Goals Patient Stated Goal: get back in bed  Plan Discharge plan remains appropriate;Frequency needs to be updated    Co-evaluation    PT/OT/SLP Co-Evaluation/Treatment: Yes Reason for Co-Treatment: For patient/therapist safety;Complexity of the patient's impairments (multi-system involvement)   OT goals addressed during session: Strengthening/ROM;ADL's and self-care      AM-PAC OT "6 Clicks" Daily Activity     Outcome Measure   Help from another person eating meals?: A Little Help from another person taking care of  personal grooming?: Total Help from another person toileting, which includes using toliet, bedpan, or urinal?: Total Help from another person bathing (including washing, rinsing, drying)?: A Lot Help from another person to put on and taking off regular upper body clothing?: A Lot Help from another person to put on and taking off regular lower body clothing?: Total 6 Click Score: 10    End of Session    OT Visit Diagnosis: Muscle weakness (generalized) (M62.81);Pain;Other symptoms and signs involving cognitive function   Activity Tolerance Patient tolerated treatment well   Patient Left in chair;with call bell/phone within reach;with chair alarm set;with nursing/sitter in room   Nurse Communication Other (comment) (RN replaced eaken pouch after leakage)        Time: 2992-4268 OT Time Calculation (min): 75 min  Charges: OT General Charges $OT Visit: 1 Visit OT Treatments $Self Care/Home Management : 23-37 mins  Nestor Lewandowsky, OTR/L Acute Rehabilitation Services Pager: [phone] Office: [phone]   Malka So 10/02/2019, 4:28 PM

## 2019-10-02 NOTE — Progress Notes (Addendum)
Physical Therapy Treatment Patient Details Name: Gina [name] MRN: [id number] DOB: 12/04/1960 Today's Date: 10/02/2019    History of Present Illness Pt is a 59 year old with past medical history significant for perforated prepyloric ulcer in 2020 status post exploratory laparotomy with [name] patch, chronic TPN via right arm PICC line, insulin-dependent diabetes, history off bilateral lower quadrant drains along with enterocutaneous fistula with colostomy bag, chronic sacral pressure ulcer stage II, schizophrenia, bilateral BKA who presents for evaluation of fever of unclear source. Also with anemia thought to be due to chronic GI bleed.     PT Comments    Unfortunately, pt has not made functional gains towards her physical therapy goals during inpatient stay. Pt requiring two person total assist for all aspects of functional mobility, including wheelchair propulsion. Pain and decreased participation are limiting factors. Recommend hoyer lift for transfers out of bed. No further acute PT needs at this time. PT signing off.    Follow Up Recommendations  SNF;LTACH      Equipment Recommendations  None recommended by PT    Recommendations for Other Services       Precautions / Restrictions Precautions Precautions: Fall Precaution Comments: eakin pouch, bilateral BKA Restrictions Weight Bearing Restrictions: No    Mobility  Bed Mobility Overal bed mobility: Needs Assistance Bed Mobility: Rolling;Supine to Sit Rolling: +2 for physical assistance;Max assist   Supine to sit: +2 for safety/equipment;Max assist     General bed mobility comments: assist to pull trunk forward and pivot with bed pad in preparation for A-P transfer to chair, rolled with chair in reclined position to change pad after pouch leak   Transfers Overall transfer level: Needs assistance   Transfers: Anterior-Posterior Transfer       Anterior-Posterior transfers: Total assist;+2 physical assistance    General transfer comment: bed>w/c>recliner using bed pad   Ambulation/Gait                 Stairs             Wheelchair Mobility    Modified Rankin (Stroke Patients Only)       Balance Overall balance assessment: Needs assistance Sitting-balance support: Bilateral upper extremity supported Sitting balance-Leahy Scale: Fair Sitting balance - Comments: requires min guard assist and B UE support to long sit in bed  Postural control: Posterior lean                                  Cognition Arousal/Alertness: Awake/alert Behavior During Therapy: WFL for tasks assessed/performed Overall Cognitive Status: History of cognitive impairments - at baseline                                        Exercises      General Comments        Pertinent Vitals/Pain Pain Assessment: Faces Faces Pain Scale: Hurts even more Pain Location: residual limbs and back Pain Descriptors / Indicators: Aching;Grimacing Pain Intervention(s): Monitored during session;Limited activity within patient's tolerance    Home Living                      Prior Function            PT Goals (current goals can now be found in the care plan section) Acute Rehab PT Goals Patient Stated Goal: get back in  bed PT Goal Formulation: All assessment and education complete, DC therapy Progress towards PT goals: Not progressing toward goals - comment    Frequency    Min 2X/week      PT Plan Other (comment) (d/c therapy)    Co-evaluation PT/OT/SLP Co-Evaluation/Treatment: Yes Reason for Co-Treatment: For patient/therapist safety;To address functional/ADL transfers PT goals addressed during session: Mobility/safety with mobility;Balance OT goals addressed during session: Strengthening/ROM;ADL's and self-care      AM-PAC PT "6 Clicks" Mobility   Outcome Measure  Help needed turning from your back to your side while in a flat bed without using  bedrails?: Total Help needed moving from lying on your back to sitting on the side of a flat bed without using bedrails?: Total Help needed moving to and from a bed to a chair (including a wheelchair)?: Total Help needed standing up from a chair using your arms (e.g., wheelchair or bedside chair)?: Total Help needed to walk in hospital room?: Total Help needed climbing 3-5 steps with a railing? : Total 6 Click Score: 6    End of Session   Activity Tolerance: Patient limited by pain Patient left: in chair;with call bell/phone within reach Nurse Communication: Mobility status PT Visit Diagnosis: Other abnormalities of gait and mobility (R26.89);Muscle weakness (generalized) (M62.81)     Time: 2549-8264 PT Time Calculation (min) (ACUTE ONLY): 75 min  Charges:  $Therapeutic Activity: 38-52 mins                       Wyona Almas, PT, DPT Acute Rehabilitation Services Pager [phone] Office [phone]    Deno Etienne 10/02/2019, 5:13 PM

## 2019-10-03 LAB — GLUCOSE, CAPILLARY: Glucose-Capillary: 287 mg/dL — ABNORMAL HIGH (ref 70–99)

## 2019-10-12 NOTE — Progress Notes (Signed)
Upon doing rounds noted pt not breathing,no pulse, pt expired at 0327 verified by coworker Nellie,RN. @0335Text  page on call X. Blount,NP and made aware pt expired. @0335  Left message to daughter  Amarri Michaelson and son [name] to call us back.

## 2019-10-12 NOTE — Progress Notes (Signed)
Pts family at the bedside grieving.  Emotional support given to pts family.  Family informed that pt passed away peacefully in her sleep.  Pts family appreciative of care. Pt reports that [facility] [place], will be getting the body.

## 2019-10-12 NOTE — Progress Notes (Signed)
2nd attempt to call but no answer.Left voice message to pt daughter and son phone number.

## 2019-10-12 NOTE — Death Summary Note (Signed)
Death Summary  Gina [name] MGQ:[id number] DOB: [birth year] DOA: 10/05/19  PCP: [name], MD   Admit date: October 05, 2019 Date of Death: 10/27/19  Final Diagnoses:  Principal Problem:   Acute GI bleeding Active Problems:   Hyperglycemia (glucose 203 on admission)   HTN (hypertension)   Type 2 diabetes mellitus with other circulatory complications (HCC)   Severe protein-calorie malnutrition (HCC)   Anemia of chronic disease   Schizophrenia (HCC)   Sacral decubitus ulcer, stage II   Below-knee amputee (HCC)   Intra-abdominal fluid collection, chronic   S/P BKA (below knee amputation) bilateral (HCC)   Enterocutaneous fistula   Fever   Iron deficiency anemia due to chronic blood loss   Hypoalbuminemia   On total parenteral nutrition (TPN)   PVD (peripheral vascular disease) (HCC) s/p bilateral BKAs   Hypotension   Hyponatremia (Sodium 131 on admission)   PUD (peptic ulcer disease) with history of perforated gastric ulcer s/p [name] patch   Chronic pain syndrome   Proteinuria   Hypomagnesemia   Hypophosphatemia   Stage 4 pressure ulcer (HCC) of bilateral coccyx   Pressure ulcer, unstageable ([place]) to right distal knee, left distal knee   Pressure ulcer of left mid thigh, stage 2 (HCC)   Pressure injury, stage 3 ([place]) to mid sacrum, left leg and right leg   Underweight   Encounter for blood transfusion on 09/13/19   Acute urinary retention   Hypoglycemia   History of present illness:  59 year old with past medical history significant for perforated prepyloric ulcer in 2020 status post exploratory laparotomy with [name] patch, chronic TPN via right arm PICC line, insulin-dependent diabetes, history of bilateral lower quadrant drains along with enterocutaneous fistula with colostomy bag, chronic sacral pressure ulcer stage II, schizophrenia, bilateral BKA who presents for evaluation of fever. She has been admitted a few times this year for sepsis including MRSA bacteremia,  fungemia, Candida peritonitis in April, Proteus UTI, Candida glabrata who completed Eraxis 6/26. Presents with persistent fevers and low hemoglobin. Patient reported that she was sent to the ED due to low hemoglobin. Patient was sent from PCP office due to hypotension, systolic blood pressure in the 60s, patient had an episode of nausea and vomiting. In the ED her hemoglobin was stable around 7.7, hyponatremia, hyperglycemia. Fecal occult was noted to be positive.  Hospital Course:  Hospital patient required packed red blood cell transfusions to maintain hemoglobin hematocrit.  She continued to have poor appetite and malnutrition. He has been living at home with her family is able to care for this time and she was agreeable to go to a nursing home.  Out of care was consulted and patient was made a DNR but could use BiPAP if needed.  She was seen by GI she was initially admitted as she had GI bleeding at admission.  Currently no evidence of infection. Repeat blood cultures, urinalysis and urine cultures negative. Infectious disease consulted, patient was restarted on Eraxis that she received for 2 days and that was discontinued.  No evidence of recurrent fever or localization of infection. Monitoring off antibiotics with no recurrent fever. CT scan abdomen pelvis chronic intra-abdominal collection, enterocutaneous fistula.  She had sacral decubitus ulcers that were being monitored and treated by wound care and were improving.  Early morning of Oct 27, 2019  was contacted by the nurse that patient had expired peacefully.  Family was called but there was no answer and message was left for them to call back   Signed:  Leory Plowman  S Gina [name]  [facility] 01-Nov-2019, 5:40 AM

## 2019-10-12 NOTE — Progress Notes (Signed)
Chaplain provided prayer with Krislyn's family at bedside.  Chaplain learned that Carling was strong-willed, resourceful, resilient, determined, and personable.  Samona per her family "never met a stranger."  Gianella's daughter voiced that her mother didn't want to go to a nursing home and family held the belief that she decided to pass on for that very reason.  Her daughter noted that she was very independent and would find ways to help herself before calling on her family.  Chaplain offered the ministries of presence and listening.  Chaplain will follow-up as needed.

## 2019-10-12 NOTE — Progress Notes (Signed)
Spoke with pts daughter Naomee Nowland, she states that she is on her way to hospital.

## 2019-10-12 NOTE — Progress Notes (Signed)
Received call from nursing staff stating family at bedside and request to speak with provider. Family updated on patient's status over the past week and treatments that were in place. Family had noted that patient had been more sleepy than usual over the past 3 days. Explained to him with that this clear sign that she was beginning the dying process. Family asked breast gratitude that patient passed away peacefully in her sleep. Emotional support given and religious believes supported. Family states that [facility] be contacted. Their family pastor as out of town. Offered to contact chaplain services here and they agreed.

## 2019-10-12 NOTE — Progress Notes (Signed)
Updated pt's son Micheline Rough earlier this am regarding his mother's death-he stated he would update his siblings. Informed the nursing staff would be contacting the family re funeral home choice. Death summary and death certificate completed by covering overnight MD.

## 2019-10-12 NOTE — Progress Notes (Addendum)
3rd attempt to call but no answer. Left message to daughter and son voice mail. Will endorse accordingly to next shift RN.

## 2019-10-12 DEATH — deceased

## 2020-08-30 IMAGING — CT CT HEAD W/O CM
3 series · 16 of 46 positions shown, 19 images · non-contrast
Comparison: 02/02/2019

CLINICAL DATA: Altered mental status

EXAM:
CT HEAD WITHOUT CONTRAST
TECHNIQUE: Contiguous axial images were obtained from the base of the skull
through the vertex without intravenous contrast.

[Series 2: head wo · axial · 0.47mm/px · z∈[-180,-60]mm · 10 of 29 slices shown, 13 images]
[im 3/29  brain]
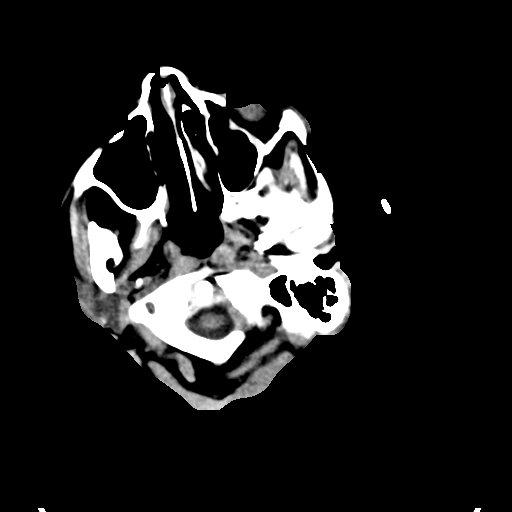
[im 3/29  bone]
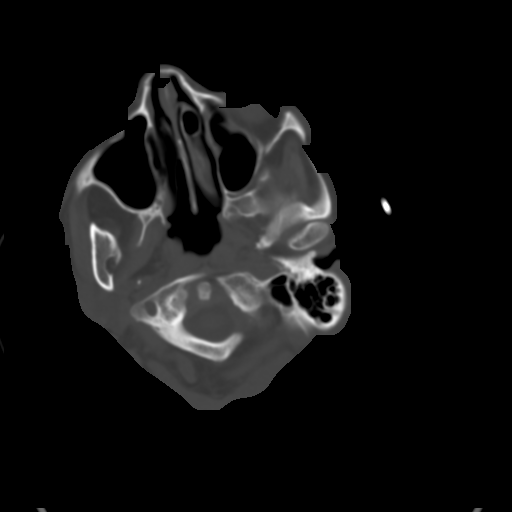
[im 6/29  brain]
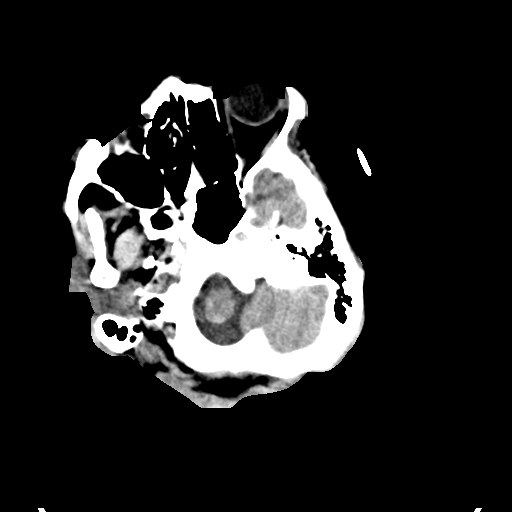
[im 8/29  brain]
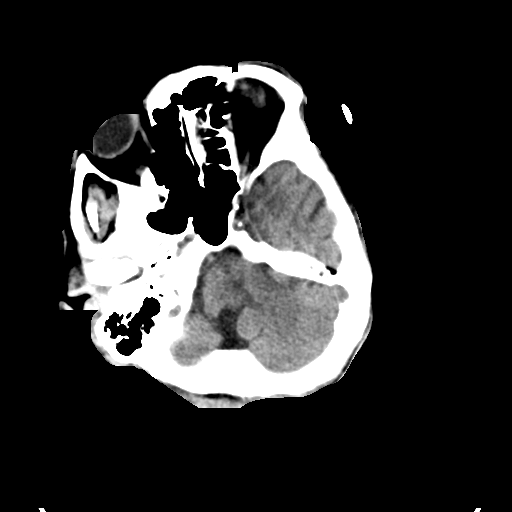
[im 11/29  brain]
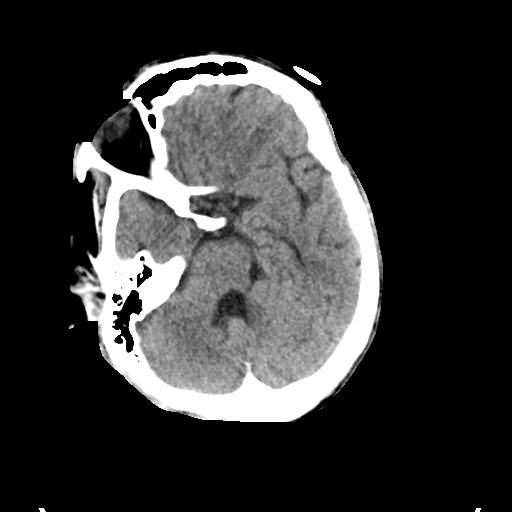
[im 14/29  brain]
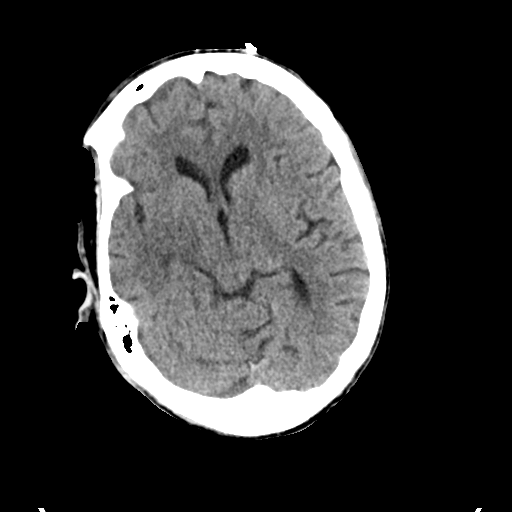
[im 14/29  bone]
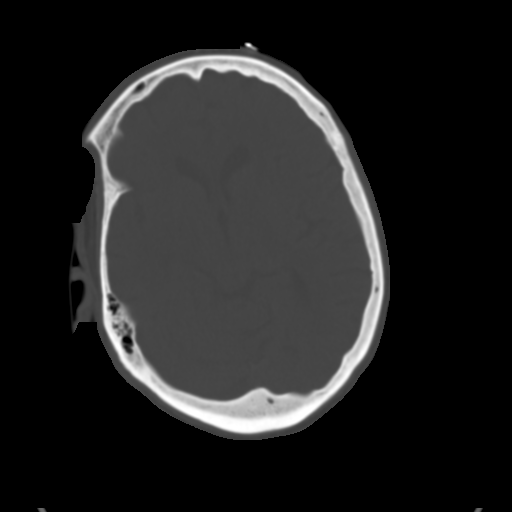
[im 16/29  brain]
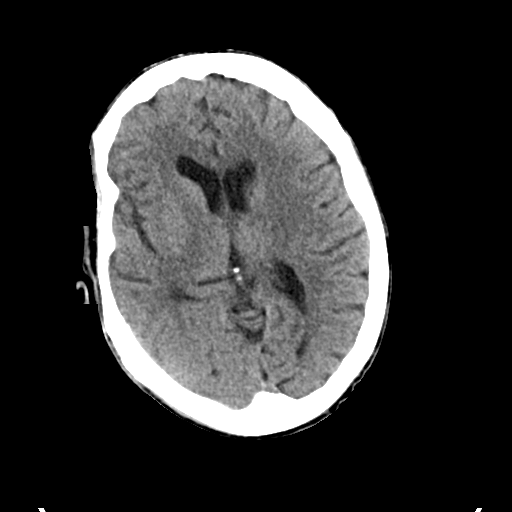
[im 19/29  brain]
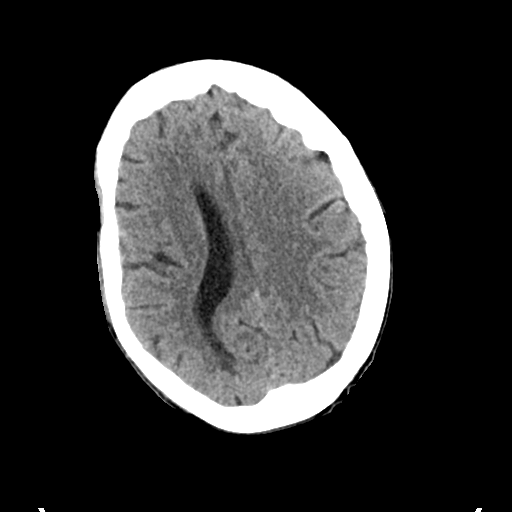
[im 22/29  brain]
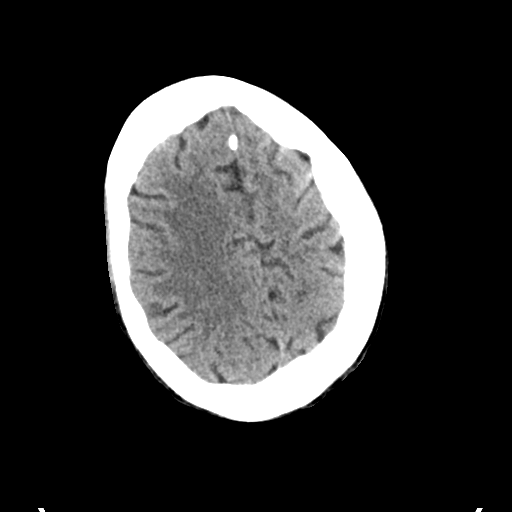
[im 24/29  brain]
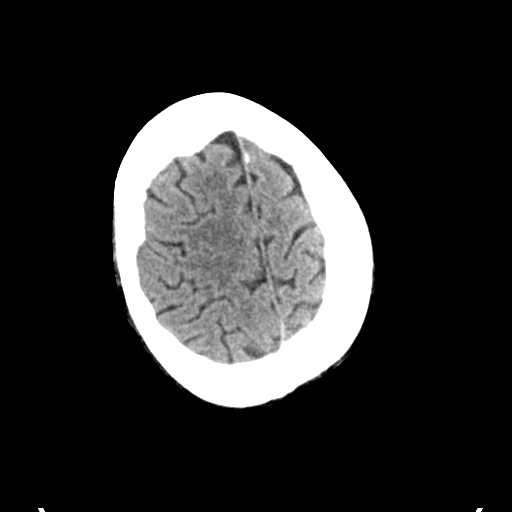
[im 24/29  bone]
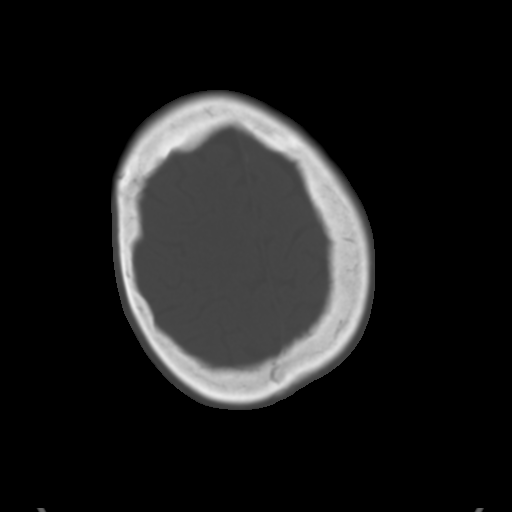
[im 27/29  brain]
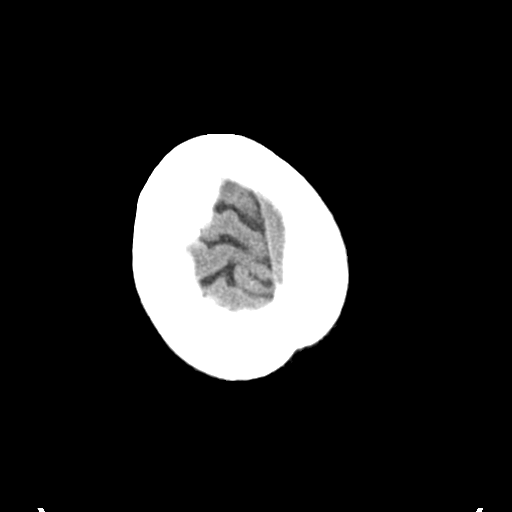

[Series 4: coronal soft tissue · coronal · 0.35mm/px · 3 of 67 slices shown]
[im 23/67  brain]
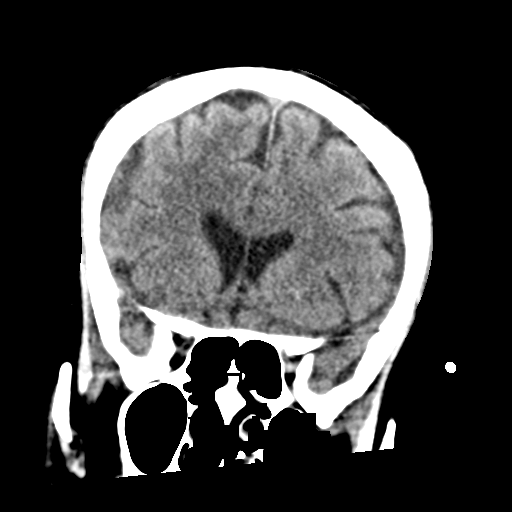
[im 30/67  brain]
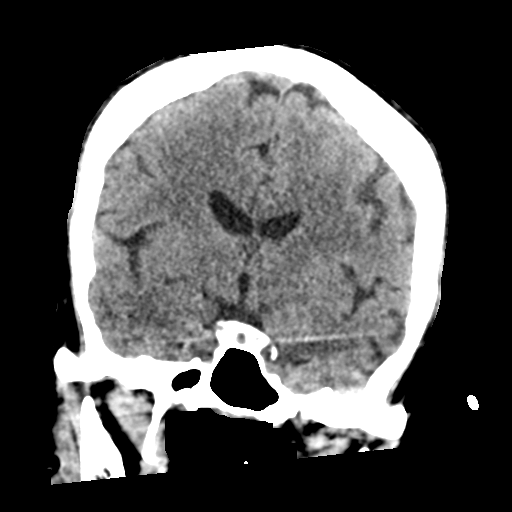
[im 37/67  brain]
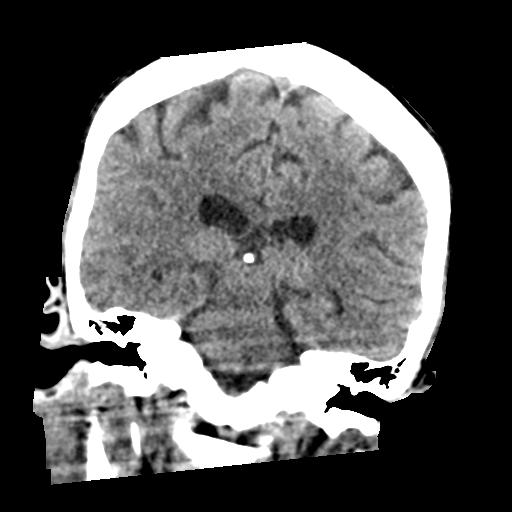

[Series 5: sagittal soft tissue · sagittal · 0.31mm/px · 3 of 54 slices shown]
[im 20/54  brain]
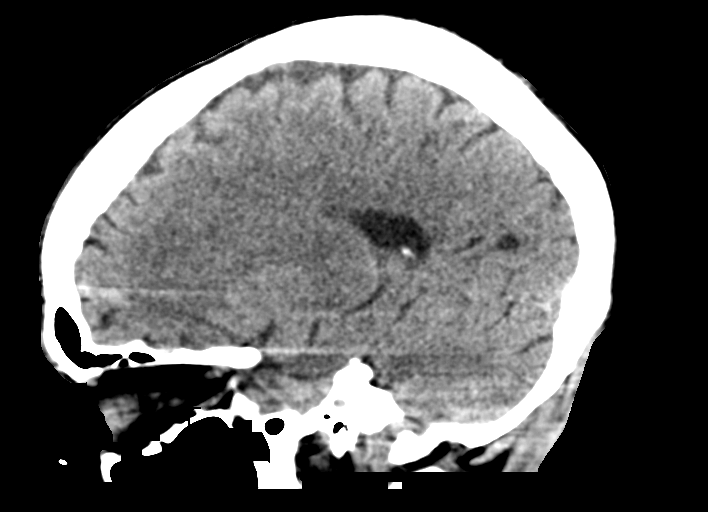
[im 27/54  brain]
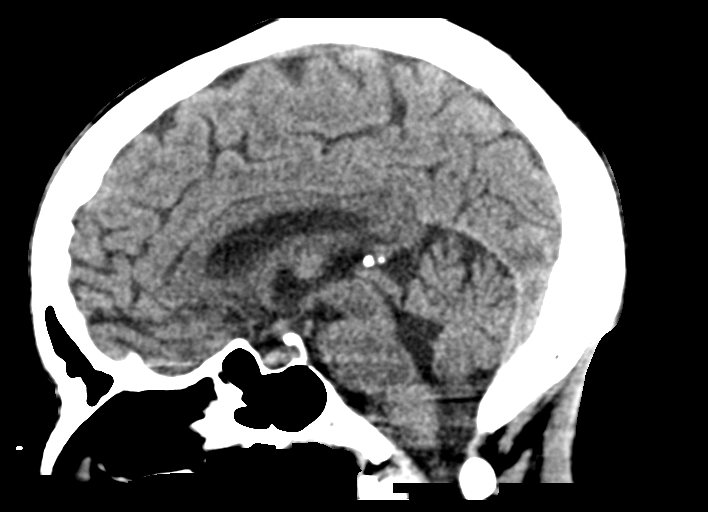
[im 34/54  brain]
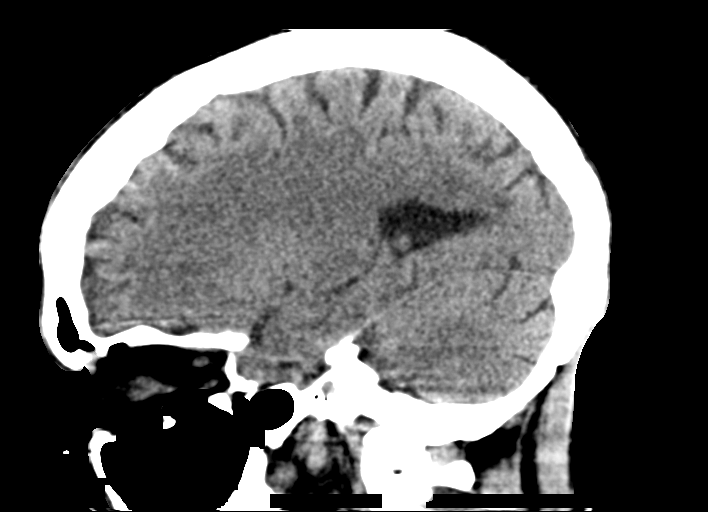

[16 of 46 positions shown; findings below may reference images not displayed]

FINDINGS: Brain: No evidence of acute infarction, hemorrhage, hydrocephalus,
extra-axial collection or mass lesion/mass effect.

Vascular: No hyperdense vessel or unexpected calcification.

Skull: Normal. Negative for fracture or focal lesion.

Sinuses/Orbits: No acute finding.

Other: None.
IMPRESSION: No acute intracranial findings.  Stable exam from 02/02/2019.

## 2020-09-19 IMAGING — US US THORACENTESIS ASP PLEURAL SPACE W/IMG GUIDE
1 series · 4 of 4 positions shown · non-contrast
Comparison: none

INDICATION: Patient with history of septic shock due to ruptured gastric ulcer
with peritonitis, abdominal abscess, right pleural effusion; request
received for diagnostic and therapeutic right thoracentesis.

[Series 1: us thoracentesis asp pleural space w/img guide · 4 of 4 slices shown]
[im 1/4]
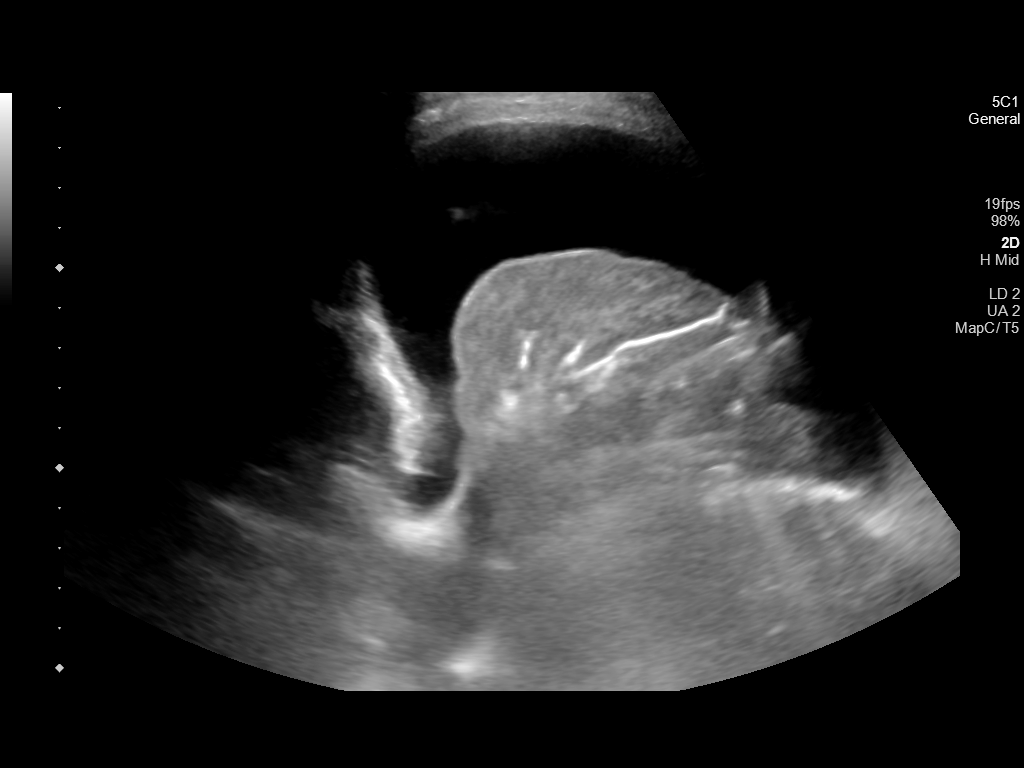
[im 2/4]
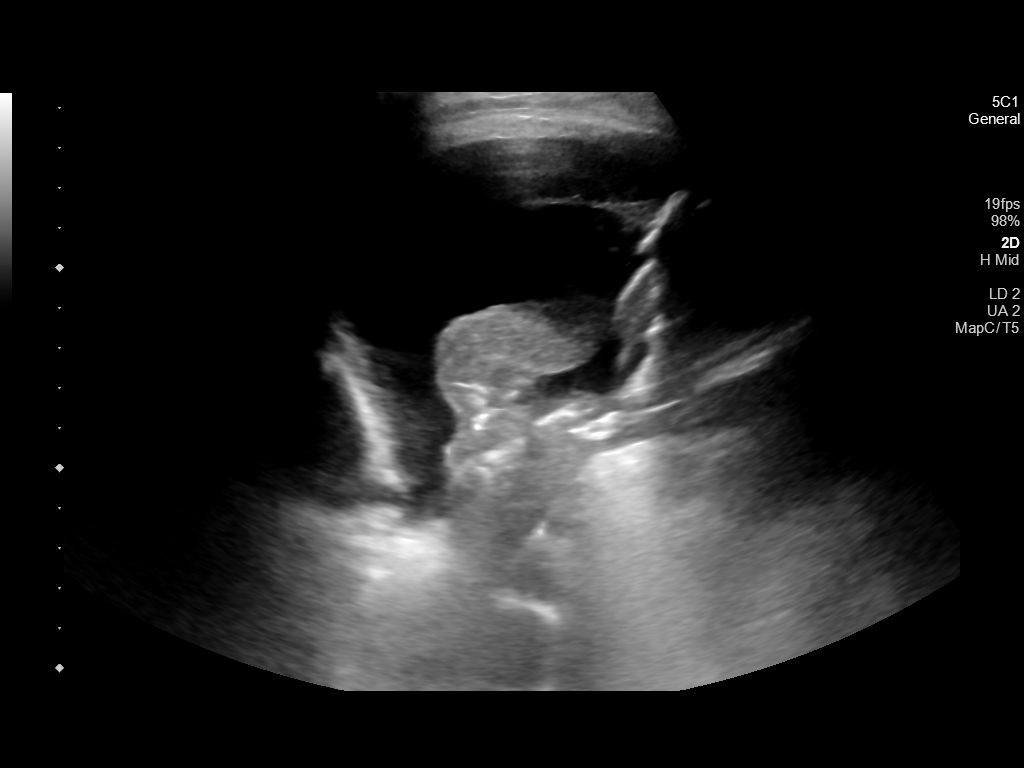
[im 3/4]
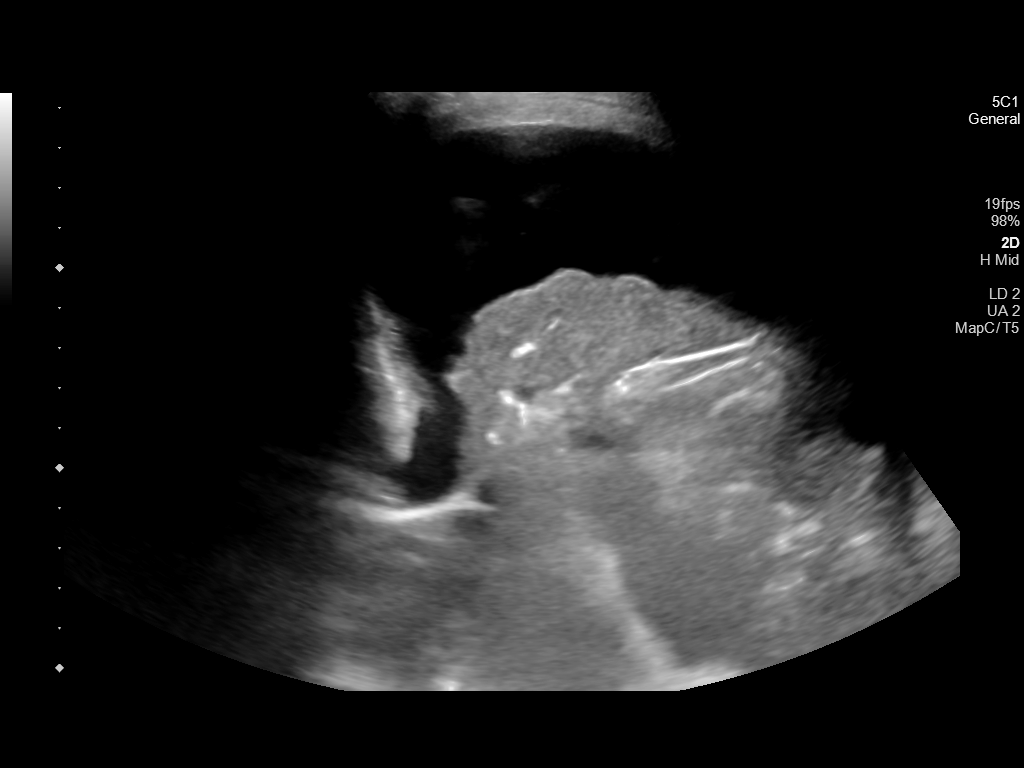
[im 4/4]
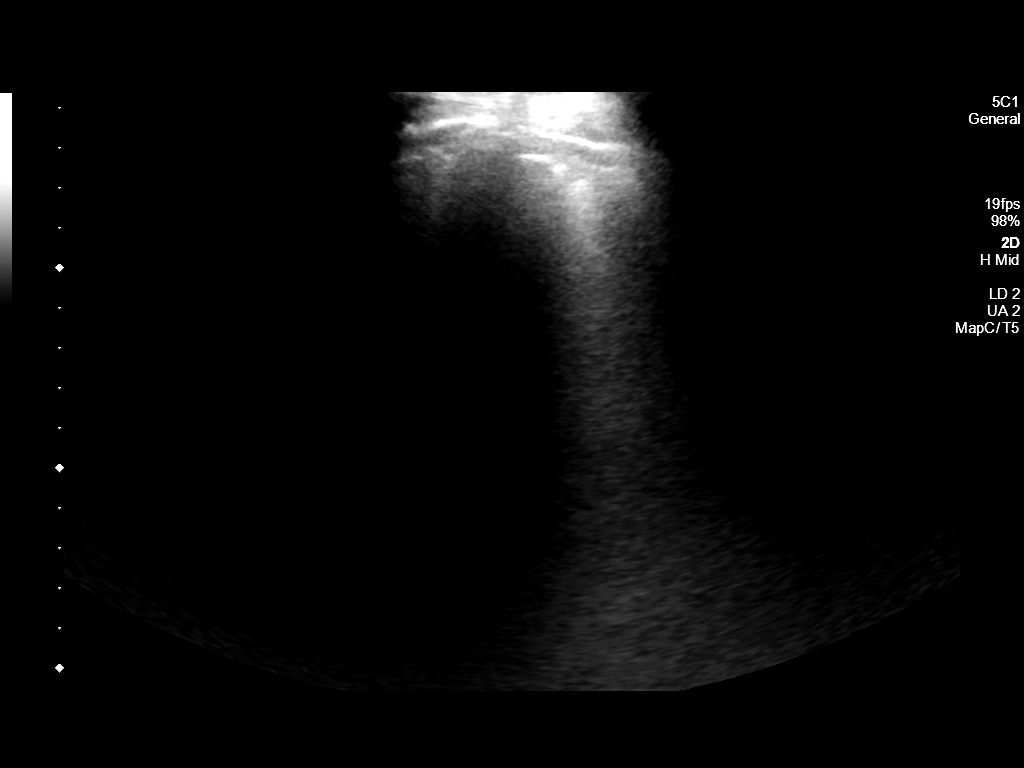

[4 of 4 positions shown; findings below may reference images not displayed]

EXAM:
ULTRASOUND GUIDED DIAGNOSTIC AND THERAPEUTIC RIGHT THORACENTESIS

MEDICATIONS:
None

COMPLICATIONS:
None immediate.

PROCEDURE:
An ultrasound guided thoracentesis was thoroughly discussed with the
patient's daughter and questions answered. The benefits, risks,
alternatives and complications were also discussed. The patient
understands and wishes to proceed with the procedure. Written
consent was obtained.

Ultrasound was performed to localize and mark an adequate pocket of
fluid in the right chest. The area was then prepped and draped in
the normal sterile fashion. 1% Lidocaine was used for local
anesthesia. Under ultrasound guidance a 6 Fr Safe-T-Centesis
catheter was introduced. Thoracentesis was performed. The catheter
was removed and a dressing applied.
FINDINGS: A total of approximately 710 cc of yellow fluid was removed. Samples
were sent to the laboratory as requested by the clinical team.
IMPRESSION: Successful ultrasound guided diagnostic and therapeutic right
thoracentesis yielding 710 cc of pleural fluid.

## 2020-10-10 IMAGING — CT CT ABD-PELV W/ CM
2 of 5 series · 15 of 46 positions shown, 17 images · IV contrast (omnipaque)
Comparison: CT the abdomen and pelvis 03/03/2019.

CLINICAL DATA: 58-year-old female with history of abdominal
infection. Suspected abscess.

EXAM:
CT ABDOMEN AND PELVIS WITH CONTRAST
TECHNIQUE: Multidetector CT imaging of the abdomen and pelvis was performed
using the standard protocol following bolus administration of
intravenous contrast.
CONTRAST:  100mL OMNIPAQUE IOHEXOL 300 MG/ML  SOLN

[Series 3: a/p w/ 5mm · axial · 0.88mm/px · z∈[+928,+1358]mm · 12 of 98 slices shown, 14 images]
[im 6/98  soft-tissue]
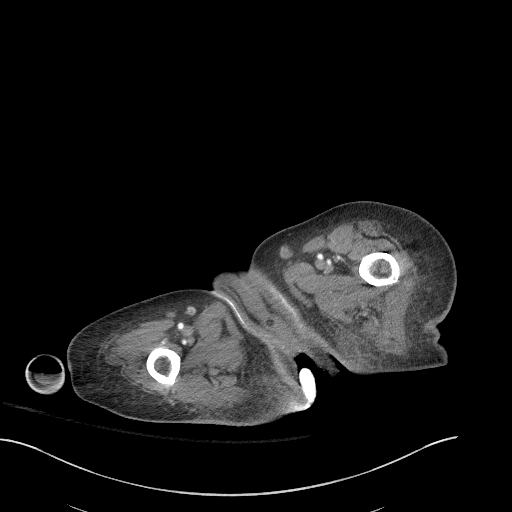
[im 6/98  bone]
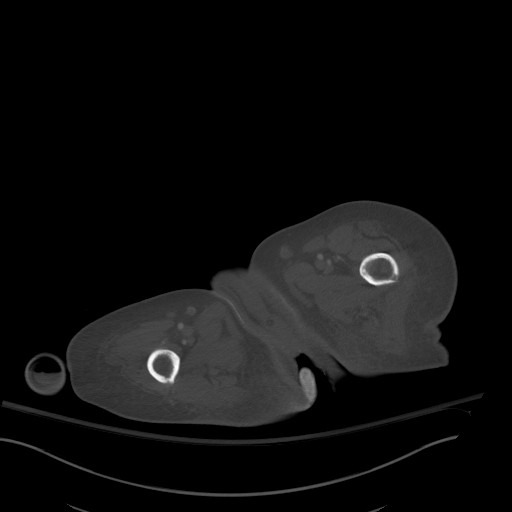
[im 16/98  soft-tissue]
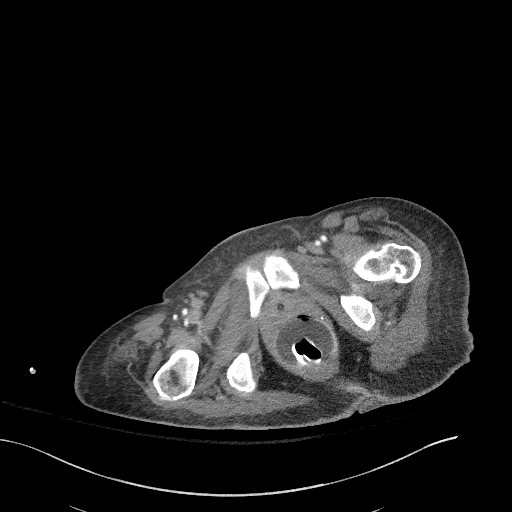
[im 21/98  soft-tissue]
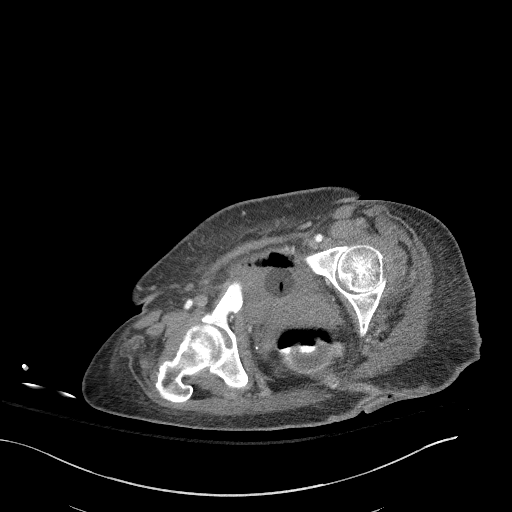
[im 31/98  soft-tissue]
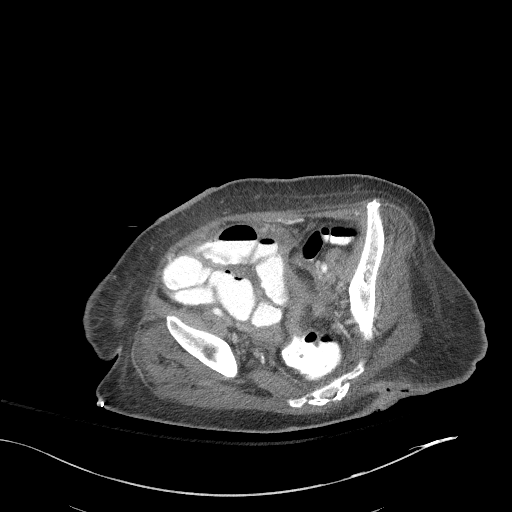
[im 36/98  soft-tissue]
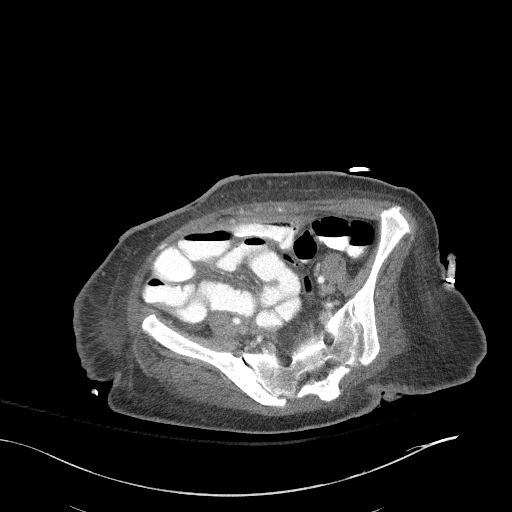
[im 46/98  soft-tissue]
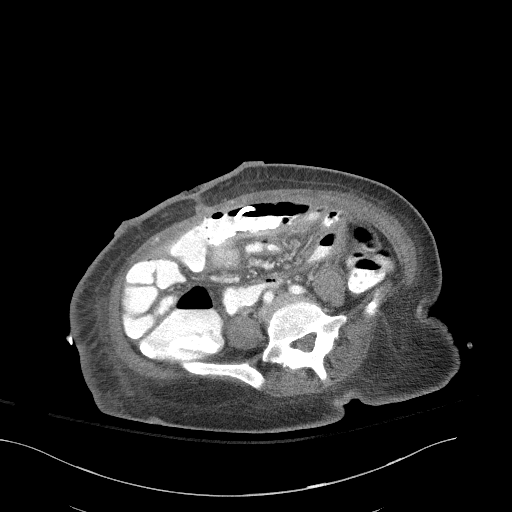
[im 52/98  soft-tissue]
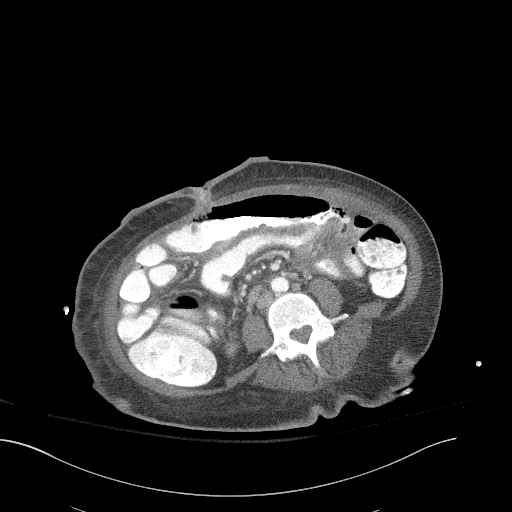
[im 62/98  soft-tissue]
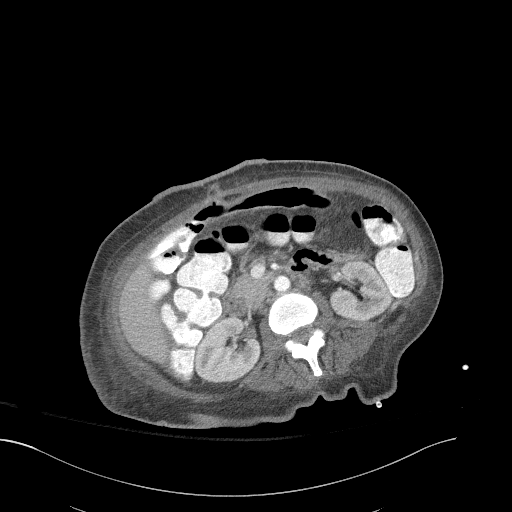
[im 67/98  soft-tissue]
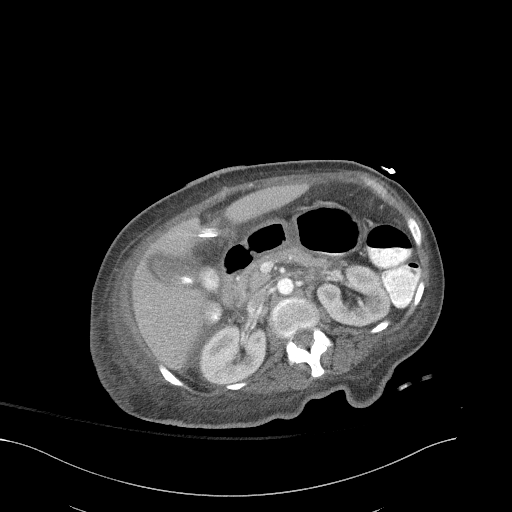
[im 67/98  bone]
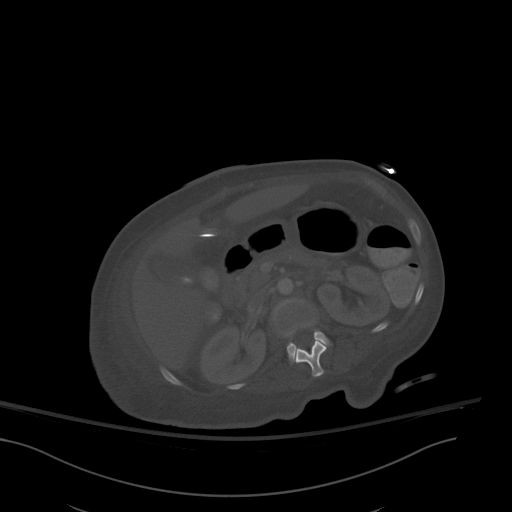
[im 77/98  soft-tissue]
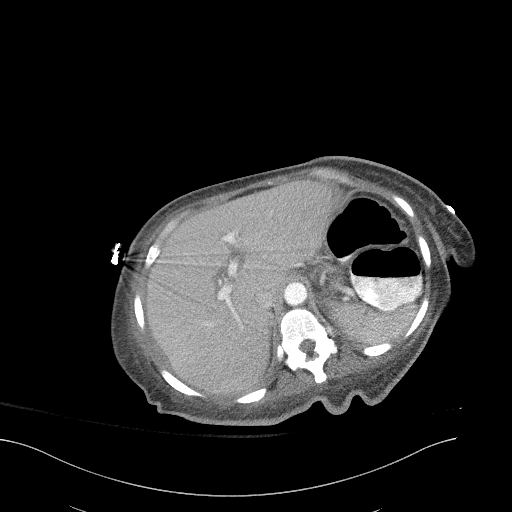
[im 82/98  soft-tissue]
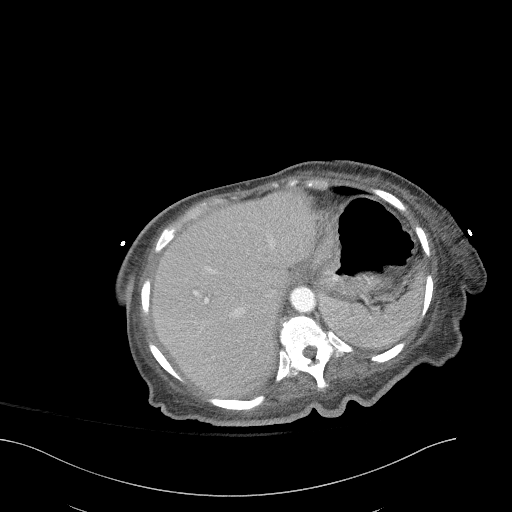
[im 92/98  soft-tissue]
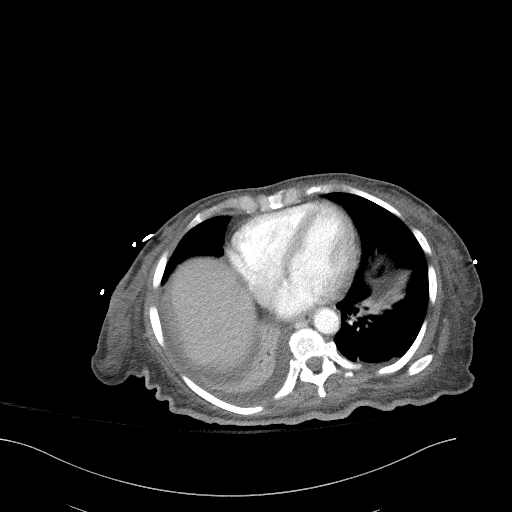

[Series 6: a/p w/ cor · coronal · 0.83mm/px · 3 of 134 slices shown]
[im 45/134  soft-tissue]
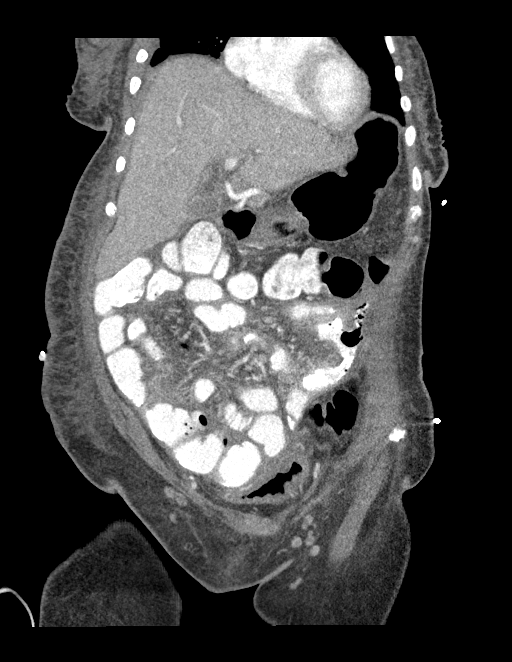
[im 60/134  soft-tissue]
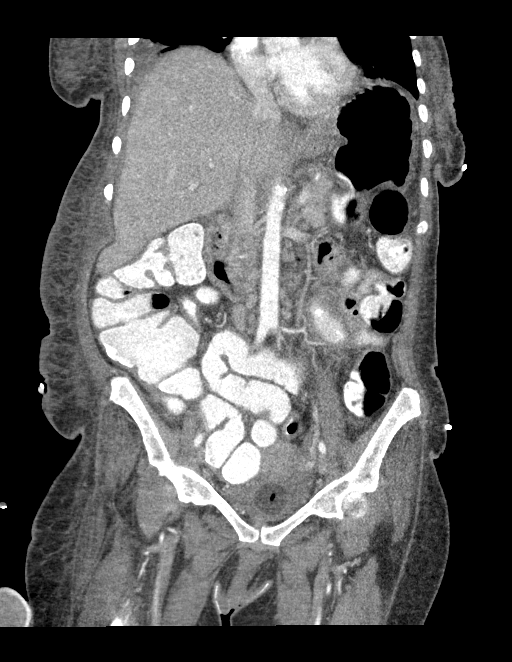
[im 74/134  soft-tissue]
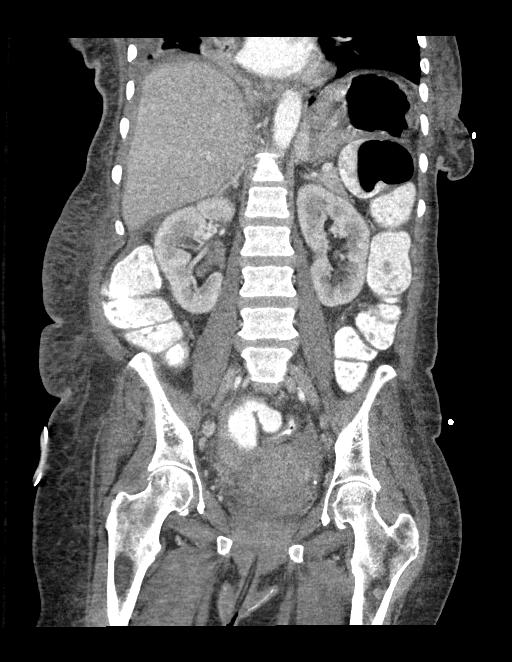

[15 of 46 positions shown; findings below may reference images not displayed]

FINDINGS: Lower chest: Small right pleural effusion lying dependently with
extensive passive atelectasis in the right lower lobe.

Hepatobiliary: No suspicious cystic or solid hepatic lesions. No
intra or extrahepatic biliary ductal dilatation. Calcified gallstone
measuring 1.3 cm lying dependently in the gallbladder. Gallbladder
is not distended.

Pancreas: No pancreatic mass. No pancreatic ductal dilatation. No
pancreatic or peripancreatic fluid collections or inflammatory
changes.

Spleen: Unremarkable.

Adrenals/Urinary Tract: Bilateral kidneys and adrenal glands are
normal in appearance. No hydroureteronephrosis. Urinary bladder is
decompressed with an indwelling Foley balloon catheter. Gas in the
urinary bladder is presumably iatrogenic.

Stomach/Bowel: Normal appearance of the stomach. No pathologic
dilatation of small bowel or colon. Communication between the small
bowel in the anterior intraperitoneal gas and fluid collection
(detailed below) in at least 2 locations on axial image 49 of series
3 and axial image 45 of series 3. Rectal bag in place. Appendix is
not confidently identified and may be surgically absent.

Vascular/Lymphatic: Aortic atherosclerosis, without evidence of
aneurysm or dissection in the abdominal or pelvic vasculature. No
lymphadenopathy noted in the abdomen or pelvis.

Reproductive: Uterus and ovaries are unremarkable in appearance.

Other: Complex gas and fluid collection which contains a large
amount of oral contrast material in the anterior abdomen estimated
to measure approximately 15.0 x 3.0 x 12.5 cm (axial image 49 of
series 3 and sagittal image 114 of series 7), with indwelling
pigtail drainage catheter, and fistulous tract to the overlying skin
surface. Surgical drain entering via the right upper quadrant
abdominal wall with tip terminating beneath the left hepatic region.
No significant volume of ascites. No pneumoperitoneum.

Musculoskeletal: There are no aggressive appearing lytic or blastic
lesions noted in the visualized portions of the skeleton.
IMPRESSION: 1. Persistent anterior intraperitoneal gas and fluid collection
which contains a large amount of oral contrast material and appears
to communicate with the small bowel in at least 2 locations,
compatible with persistent bowel perforation with chronic abscess
and fistulization through the anterior abdominal wall, as detailed
above. The pigtail drainage catheter appears appropriately located
within this collection.
2. Small right pleural effusion lying dependently with passive
atelectasis in the right lower lobe.
3. Cholelithiasis.
4. Aortic atherosclerosis.
5. Additional incidental findings, as above.
